# Patient Record
Sex: Male | Born: 1935
Health system: Southern US, Community
[De-identification: ages and names within clinical notes are randomized; demographics above are authoritative.]

## PROBLEM LIST (undated history)

## (undated) DIAGNOSIS — Z91199 Patient's noncompliance with other medical treatment and regimen due to unspecified reason: Secondary | ICD-10-CM

## (undated) DIAGNOSIS — Z9981 Dependence on supplemental oxygen: Secondary | ICD-10-CM

## (undated) DIAGNOSIS — D500 Iron deficiency anemia secondary to blood loss (chronic): Principal | ICD-10-CM

## (undated) DIAGNOSIS — I4891 Unspecified atrial fibrillation: Secondary | ICD-10-CM

## (undated) DIAGNOSIS — E785 Hyperlipidemia, unspecified: Secondary | ICD-10-CM

## (undated) DIAGNOSIS — J449 Chronic obstructive pulmonary disease, unspecified: Secondary | ICD-10-CM

## (undated) DIAGNOSIS — J9610 Chronic respiratory failure, unspecified whether with hypoxia or hypercapnia: Secondary | ICD-10-CM

## (undated) DIAGNOSIS — J40 Bronchitis, not specified as acute or chronic: Secondary | ICD-10-CM

## (undated) DIAGNOSIS — I10 Essential (primary) hypertension: Secondary | ICD-10-CM

## (undated) DIAGNOSIS — Z9119 Patient's noncompliance with other medical treatment and regimen: Secondary | ICD-10-CM

## (undated) DIAGNOSIS — R972 Elevated prostate specific antigen [PSA]: Secondary | ICD-10-CM

## (undated) DIAGNOSIS — E559 Vitamin D deficiency, unspecified: Secondary | ICD-10-CM

## (undated) DIAGNOSIS — E7800 Pure hypercholesterolemia, unspecified: Secondary | ICD-10-CM

## (undated) DIAGNOSIS — T7840XA Allergy, unspecified, initial encounter: Secondary | ICD-10-CM

## (undated) DIAGNOSIS — J8410 Pulmonary fibrosis, unspecified: Secondary | ICD-10-CM

## (undated) DIAGNOSIS — K635 Polyp of colon: Secondary | ICD-10-CM

## (undated) HISTORY — DX: Pure hypercholesterolemia, unspecified: E78.00

## (undated) HISTORY — DX: Iron deficiency anemia secondary to blood loss (chronic): D50.0

## (undated) HISTORY — DX: Allergy, unspecified, initial encounter: T78.40XA

## (undated) HISTORY — DX: Bronchitis, not specified as acute or chronic: J40

## (undated) HISTORY — DX: Vitamin D deficiency, unspecified: E55.9

## (undated) HISTORY — DX: Polyp of colon: K63.5

## (undated) HISTORY — DX: Unspecified atrial fibrillation: I48.91

## (undated) HISTORY — DX: Essential (primary) hypertension: I10

## (undated) HISTORY — DX: Pulmonary fibrosis, unspecified: J84.10

## (undated) HISTORY — DX: Chronic obstructive pulmonary disease, unspecified: J44.9

## (undated) HISTORY — DX: Elevated prostate specific antigen (PSA): R97.20

## (undated) HISTORY — DX: Hyperlipidemia, unspecified: E78.5

---

## 2002-09-21 ENCOUNTER — Emergency Department (HOSPITAL_COMMUNITY): Admission: EM | Admit: 2002-09-21 | Discharge: 2002-09-21 | Payer: Self-pay | Admitting: Emergency Medicine

## 2003-03-24 ENCOUNTER — Ambulatory Visit (HOSPITAL_COMMUNITY): Admission: RE | Admit: 2003-03-24 | Discharge: 2003-03-24 | Payer: Self-pay | Admitting: Family Medicine

## 2003-03-24 ENCOUNTER — Encounter: Payer: Self-pay | Admitting: Family Medicine

## 2003-05-08 ENCOUNTER — Encounter: Payer: Self-pay | Admitting: Family Medicine

## 2003-05-08 ENCOUNTER — Ambulatory Visit (HOSPITAL_COMMUNITY): Admission: RE | Admit: 2003-05-08 | Discharge: 2003-05-08 | Payer: Self-pay | Admitting: Family Medicine

## 2007-10-02 ENCOUNTER — Emergency Department (HOSPITAL_COMMUNITY): Admission: EM | Admit: 2007-10-02 | Discharge: 2007-10-02 | Payer: Self-pay | Admitting: Emergency Medicine

## 2010-09-14 ENCOUNTER — Ambulatory Visit: Payer: Self-pay | Admitting: Cardiovascular Disease

## 2011-04-10 ENCOUNTER — Encounter: Payer: Self-pay | Admitting: Family Medicine

## 2011-04-10 DIAGNOSIS — K635 Polyp of colon: Secondary | ICD-10-CM | POA: Insufficient documentation

## 2011-04-10 DIAGNOSIS — E785 Hyperlipidemia, unspecified: Secondary | ICD-10-CM | POA: Insufficient documentation

## 2011-04-10 DIAGNOSIS — T7840XA Allergy, unspecified, initial encounter: Secondary | ICD-10-CM | POA: Insufficient documentation

## 2011-04-10 DIAGNOSIS — I10 Essential (primary) hypertension: Secondary | ICD-10-CM | POA: Insufficient documentation

## 2011-04-10 DIAGNOSIS — J8410 Pulmonary fibrosis, unspecified: Secondary | ICD-10-CM | POA: Insufficient documentation

## 2011-04-10 DIAGNOSIS — J449 Chronic obstructive pulmonary disease, unspecified: Secondary | ICD-10-CM | POA: Insufficient documentation

## 2011-04-10 DIAGNOSIS — J40 Bronchitis, not specified as acute or chronic: Secondary | ICD-10-CM | POA: Insufficient documentation

## 2011-04-10 DIAGNOSIS — E1165 Type 2 diabetes mellitus with hyperglycemia: Secondary | ICD-10-CM | POA: Insufficient documentation

## 2012-05-14 LAB — HM DIABETES EYE EXAM

## 2012-06-20 NOTE — Patient Instructions (Addendum)
Your procedure is scheduled on: 06/25/2012  Report to [facility] at  900       AM.  Call this number if you have problems the morning of surgery: [phone]   Do not eat food or drink liquids :After Midnight.      Take these medicines the morning of surgery with A SIP OF WATER:qunipril. Take albuterol before you come.   Do not wear jewelry, make-up or nail polish.  Do not wear lotions, powders, or perfumes. You may wear deodorant.  Do not shave 48 hours prior to surgery.  Do not bring valuables to the hospital.  Contacts, dentures or bridgework may not be worn into surgery.  Leave suitcase in the car. After surgery it may be brought to your room.  For patients admitted to the hospital, checkout time is 11:00 AM the day of discharge.   Patients discharged the day of surgery will not be allowed to drive home.  :     Please read over the following fact sheets that you were given: Coughing and Deep Breathing, Surgical Site Infection Prevention, Anesthesia Post-op Instructions and Care and Recovery After Surgery    Cataract A cataract is a clouding of the lens of the eye. When a lens becomes cloudy, vision is reduced based on the degree and nature of the clouding. Many cataracts reduce vision to some degree. Some cataracts make people more near-sighted as they develop. Other cataracts increase glare. Cataracts that are ignored and become worse can sometimes look white. The white color can be seen through the pupil. CAUSES   Aging. However, cataracts may occur at any age, even in newborns.   Certain drugs.   Trauma to the eye.   Certain diseases such as diabetes.   Specific eye diseases such as chronic inflammation inside the eye or a sudden attack of a rare form of glaucoma.   Inherited or acquired medical problems.  SYMPTOMS   Gradual, progressive drop in vision in the affected eye.   Severe, rapid visual loss. This most often happens when trauma is the cause.  DIAGNOSIS  To  detect a cataract, an eye doctor examines the lens. Cataracts are best diagnosed with an exam of the eyes with the pupils enlarged (dilated) by drops.  TREATMENT  For an early cataract, vision may improve by using different eyeglasses or stronger lighting. If that does not help your vision, surgery is the only effective treatment. A cataract needs to be surgically removed when vision loss interferes with your everyday activities, such as driving, reading, or watching TV. A cataract may also have to be removed if it prevents examination or treatment of another eye problem. Surgery removes the cloudy lens and usually replaces it with a substitute lens (intraocular lens, IOL).  At a time when both you and your doctor agree, the cataract will be surgically removed. If you have cataracts in both eyes, only one is usually removed at a time. This allows the operated eye to heal and be out of danger from any possible problems after surgery (such as infection or poor wound healing). In rare cases, a cataract may be doing damage to your eye. In these cases, your caregiver may advise surgical removal right away. The vast majority of people who have cataract surgery have better vision afterward. HOME CARE INSTRUCTIONS  If you are not planning surgery, you may be asked to do the following:  Use different eyeglasses.   Use stronger or brighter lighting.   Ask  your eye doctor about reducing your medicine dose or changing medicines if it is thought that a medicine caused your cataract. Changing medicines does not make the cataract go away on its own.   Become familiar with your surroundings. Poor vision can lead to injury. Avoid bumping into things on the affected side. You are at a higher risk for tripping or falling.   Exercise extreme care when driving or operating machinery.   Wear sunglasses if you are sensitive to bright light or experiencing problems with glare.  SEEK IMMEDIATE MEDICAL CARE IF:   You have  a worsening or sudden vision loss.   You notice redness, swelling, or increasing pain in the eye.   You have a fever.  Document Released: 09/18/2005 Document Revised: 09/07/2011 Document Reviewed: 05/12/2011 [facility] Patient Information 2012 [place], [place].PATIENT INSTRUCTIONS POST-ANESTHESIA  IMMEDIATELY FOLLOWING SURGERY:  Do not drive or operate machinery for the first twenty four hours after surgery.  Do not make any important decisions for twenty four hours after surgery or while taking narcotic pain medications or sedatives.  If you develop intractable nausea and vomiting or a severe headache please notify your doctor immediately.  FOLLOW-UP:  Please make an appointment with your surgeon as instructed. You do not need to follow up with anesthesia unless specifically instructed to do so.  WOUND CARE INSTRUCTIONS (if applicable):  Keep a dry clean dressing on the anesthesia/puncture wound site if there is drainage.  Once the wound has quit draining you may leave it open to air.  Generally you should leave the bandage intact for twenty four hours unless there is drainage.  If the epidural site drains for more than 36-48 hours please call the anesthesia department.  QUESTIONS?:  Please feel free to call your physician or the hospital operator if you have any questions, and they will be happy to assist you.

## 2012-06-21 ENCOUNTER — Encounter (HOSPITAL_COMMUNITY): Payer: Self-pay

## 2012-06-21 ENCOUNTER — Encounter (HOSPITAL_COMMUNITY)
Admission: RE | Admit: 2012-06-21 | Discharge: 2012-06-21 | Disposition: A | Discharge status: Home / Self Care | Payer: Medicare Other | Source: Ambulatory Visit | Attending: Ophthalmology | Admitting: Ophthalmology

## 2012-06-21 ENCOUNTER — Encounter (HOSPITAL_COMMUNITY): Payer: Self-pay | Admitting: Pharmacy Technician

## 2012-06-21 ENCOUNTER — Ambulatory Visit (HOSPITAL_COMMUNITY)
Admission: RE | Admit: 2012-06-21 | Discharge: 2012-06-21 | Disposition: A | Discharge status: Home / Self Care | Payer: Medicare Other | Source: Ambulatory Visit | Attending: Ophthalmology | Admitting: Ophthalmology

## 2012-06-21 LAB — BASIC METABOLIC PANEL
BUN: 13 mg/dL (ref 6–23)
CO2: 30 mEq/L (ref 19–32)
Calcium: 10 mg/dL (ref 8.4–10.5)
GFR calc non Af Amer: 88 mL/min — ABNORMAL LOW (ref >90)
Glucose, Bld: 91 mg/dL (ref 70–99)
Potassium: 5.2 mEq/L — ABNORMAL HIGH (ref 3.5–5.1)

## 2012-06-21 LAB — HEMOGLOBIN AND HEMATOCRIT, BLOOD: HCT: 46.3 % (ref 39.0–52.0)

## 2012-06-21 NOTE — Progress Notes (Signed)
06/21/12 1334  OBSTRUCTIVE SLEEP APNEA  Have you ever been diagnosed with sleep apnea through a sleep study? No  Do you snore loudly (loud enough to be heard through closed doors)?  0  Do you often feel tired, fatigued, or sleepy during the daytime? 1  Has anyone observed you stop breathing during your sleep? 0  Do you have, or are you being treated for high blood pressure? 1  BMI more than 35 kg/m2? 0  Age over 75 years old? 1  Neck circumference greater than 40 cm/18 inches? 0  Gender: 1  Obstructive Sleep Apnea Score 4   Score 4 or greater  Results sent to PCP

## 2012-06-24 MED ORDER — PHENYLEPHRINE HCL 2.5 % OP SOLN
OPHTHALMIC | Status: AC
  Filled 2012-06-24: qty 2

## 2012-06-24 MED ORDER — TETRACAINE HCL 0.5 % OP SOLN
OPHTHALMIC | Status: AC
  Filled 2012-06-24: qty 2

## 2012-06-24 MED ORDER — CYCLOPENTOLATE HCL 1 % OP SOLN
OPHTHALMIC | Status: AC
  Filled 2012-06-24: qty 2

## 2012-06-24 MED ORDER — FLURBIPROFEN SODIUM 0.03 % OP SOLN
OPHTHALMIC | Status: AC
  Filled 2012-06-24: qty 2.5

## 2012-06-24 NOTE — Pre-Procedure Instructions (Signed)
Potassium 5.2-shown to Dr Jayme Cloud. I-stat ordered on arrival to [facility] 06/25/2012.

## 2012-06-25 ENCOUNTER — Encounter (HOSPITAL_COMMUNITY): Payer: Self-pay | Admitting: Anesthesiology

## 2012-06-25 ENCOUNTER — Encounter (HOSPITAL_COMMUNITY): Payer: Self-pay | Admitting: *Deleted

## 2012-06-25 ENCOUNTER — Ambulatory Visit (HOSPITAL_COMMUNITY)
Admission: RE | Admit: 2012-06-25 | Discharge: 2012-06-25 | Disposition: A | Discharge status: Home / Self Care | Payer: Medicare Other | Source: Ambulatory Visit | Attending: Ophthalmology | Admitting: Ophthalmology

## 2012-06-25 ENCOUNTER — Encounter (HOSPITAL_COMMUNITY)
Admission: RE | Disposition: A | Discharge status: Home / Self Care | Payer: Self-pay | Source: Ambulatory Visit | Attending: Ophthalmology

## 2012-06-25 ENCOUNTER — Ambulatory Visit (HOSPITAL_COMMUNITY): Payer: Medicare Other | Admitting: Anesthesiology

## 2012-06-25 DIAGNOSIS — Z01812 Encounter for preprocedural laboratory examination: Secondary | ICD-10-CM | POA: Insufficient documentation

## 2012-06-25 DIAGNOSIS — E119 Type 2 diabetes mellitus without complications: Secondary | ICD-10-CM | POA: Insufficient documentation

## 2012-06-25 DIAGNOSIS — Z01810 Encounter for preprocedural cardiovascular examination: Secondary | ICD-10-CM | POA: Insufficient documentation

## 2012-06-25 DIAGNOSIS — Z79899 Other long term (current) drug therapy: Secondary | ICD-10-CM | POA: Insufficient documentation

## 2012-06-25 DIAGNOSIS — J4489 Other specified chronic obstructive pulmonary disease: Secondary | ICD-10-CM | POA: Insufficient documentation

## 2012-06-25 DIAGNOSIS — I10 Essential (primary) hypertension: Secondary | ICD-10-CM | POA: Insufficient documentation

## 2012-06-25 DIAGNOSIS — H2510 Age-related nuclear cataract, unspecified eye: Secondary | ICD-10-CM | POA: Insufficient documentation

## 2012-06-25 DIAGNOSIS — J449 Chronic obstructive pulmonary disease, unspecified: Secondary | ICD-10-CM | POA: Insufficient documentation

## 2012-06-25 HISTORY — PX: CATARACT EXTRACTION W/PHACO: SHX586

## 2012-06-25 LAB — GLUCOSE, CAPILLARY: Glucose-Capillary: 163 mg/dL — ABNORMAL HIGH (ref 70–99)

## 2012-06-25 SURGERY — PHACOEMULSIFICATION, CATARACT, WITH IOL INSERTION
Anesthesia: Monitor Anesthesia Care | Site: Eye | Laterality: Left | Wound class: Clean

## 2012-06-25 MED ORDER — MIDAZOLAM HCL 2 MG/2ML IJ SOLN
1.0000 mg | INTRAMUSCULAR | Status: DC | PRN
  Administered 2012-06-25: 2 mg via INTRAVENOUS

## 2012-06-25 MED ORDER — PHENYLEPHRINE HCL 2.5 % OP SOLN
1.0000 [drp] | OPHTHALMIC | Status: AC
  Administered 2012-06-25 (×3): 1 [drp] via OPHTHALMIC

## 2012-06-25 MED ORDER — EPINEPHRINE HCL 1 MG/ML IJ SOLN
INTRAOCULAR | Status: DC | PRN
  Administered 2012-06-25: 11:00:00

## 2012-06-25 MED ORDER — PROVISC 10 MG/ML IO SOLN
INTRAOCULAR | Status: DC | PRN
  Administered 2012-06-25: 8.5 mg via INTRAOCULAR

## 2012-06-25 MED ORDER — EPINEPHRINE HCL 1 MG/ML IJ SOLN
INTRAMUSCULAR | Status: AC
  Filled 2012-06-25: qty 1

## 2012-06-25 MED ORDER — FENTANYL CITRATE 0.05 MG/ML IJ SOLN: 25.0000 ug | INTRAMUSCULAR | Status: DC | PRN

## 2012-06-25 MED ORDER — FLURBIPROFEN SODIUM 0.03 % OP SOLN
1.0000 [drp] | OPHTHALMIC | Status: DC | PRN
  Administered 2012-06-25 (×3): 1 [drp] via OPHTHALMIC

## 2012-06-25 MED ORDER — ONDANSETRON HCL 4 MG/2ML IJ SOLN: 4.0000 mg | Freq: Once | INTRAMUSCULAR | Status: DC | PRN

## 2012-06-25 MED ORDER — BSS IO SOLN
INTRAOCULAR | Status: DC | PRN
  Administered 2012-06-25: 15 mL via INTRAOCULAR

## 2012-06-25 MED ORDER — MIDAZOLAM HCL 2 MG/2ML IJ SOLN
INTRAMUSCULAR | Status: AC
  Filled 2012-06-25: qty 2

## 2012-06-25 MED ORDER — CYCLOPENTOLATE-PHENYLEPHRINE 0.2-1 % OP SOLN: 1.0000 [drp] | OPHTHALMIC | Status: DC

## 2012-06-25 MED ORDER — LACTATED RINGERS IV SOLN
INTRAVENOUS | Status: DC
  Administered 2012-06-25: 1000 mL via INTRAVENOUS

## 2012-06-25 MED ORDER — TETRACAINE HCL 0.5 % OP SOLN
1.0000 [drp] | OPHTHALMIC | Status: AC
  Administered 2012-06-25 (×3): 1 [drp] via OPHTHALMIC

## 2012-06-25 MED ORDER — CYCLOPENTOLATE HCL 1 % OP SOLN
1.0000 [drp] | OPHTHALMIC | Status: AC
  Administered 2012-06-25 (×3): 1 [drp] via OPHTHALMIC

## 2012-06-25 MED ORDER — KETOROLAC TROMETHAMINE 0.5 % OP SOLN: 1.0000 [drp] | OPHTHALMIC | Status: DC

## 2012-06-25 SURGICAL SUPPLY — 9 items
CLOTH BEACON ORANGE TIMEOUT ST (SAFETY) ×2 IMPLANT
EYE SHIELD UNIVERSAL CLEAR (GAUZE/BANDAGES/DRESSINGS) ×2 IMPLANT
GLOVE BIO SURGEON STRL SZ 6.5 (GLOVE) ×2 IMPLANT
GLOVE EXAM NITRILE MD LF STRL (GLOVE) ×2 IMPLANT
PAD ARMBOARD 7.5X6 YLW CONV (MISCELLANEOUS) ×2 IMPLANT
SIGHTPATH CAT PROC W REG LENS (Ophthalmic Related) ×2 IMPLANT
TAPE SURG TRANSPORE 1 IN (GAUZE/BANDAGES/DRESSINGS) ×1 IMPLANT
TAPE SURGICAL TRANSPORE 1 IN (GAUZE/BANDAGES/DRESSINGS) ×1
WATER STERILE IRR 250ML POUR (IV SOLUTION) ×2 IMPLANT

## 2012-06-25 NOTE — H&P (Signed)
The patient was re examined and there is no change in the patients condition since the original H and P. 

## 2012-06-25 NOTE — Op Note (Signed)
Patient brought to the operating room and prepped and draped in the usual manner.  Lid speculum inserted in left eye.  Stab incision made at the twelve o'clock position.  Provisc instilled in the anterior chamber.   A 2.4 mm. Stab incision was made temporally.  An anterior capsulotomy was done with a bent 25 gauge needle.  The nucleus was hydrodissected.  The Phaco tip was inserted in the anterior chamber and the nucleus was emulsified.  CDE was 19.01.  The cortical material was then removed with the I and A tip.  Posterior capsule was the polished.  The anterior chamber was deepened with Provisc.  A 19.5 Diopter Rayner 570C IOL was then inserted in the capsular bag.  Provisc was then removed with the I and A tip.  The wound was then hydrated.  Patient sent to the Recovery Room in good condition with follow up in my office.

## 2012-06-25 NOTE — Anesthesia Postprocedure Evaluation (Signed)
  Anesthesia Post-op Note  Patient: Edward [name]  Procedure(s) Performed: Procedure(s) (LRB) with comments: CATARACT EXTRACTION PHACO AND INTRAOCULAR LENS PLACEMENT (IOC) (Left) - CDE=19.01  Patient Location: PACU and Short Stay  Anesthesia Type: MAC  Level of Consciousness: awake, alert , oriented and patient cooperative  Airway and Oxygen Therapy: Patient Spontanous Breathing  Post-op Pain: none  Post-op Assessment: Post-op Vital signs reviewed, Patient's Cardiovascular Status Stable, PATIENT'S CARDIOVASCULAR STATUS UNSTABLE, Patent Airway, No signs of Nausea or vomiting and Pain level controlled  Post-op Vital Signs: Reviewed and stable  Complications: No apparent anesthesia complications

## 2012-06-25 NOTE — Anesthesia Preprocedure Evaluation (Signed)
Anesthesia Evaluation  Patient identified by MRN, date of birth, ID band Patient awake    Reviewed: Allergy & Precautions, H&P , NPO status , Patient's Chart, lab work & pertinent test results  Airway Mallampati: I      Dental  (+) Edentulous Upper and Edentulous Lower   Pulmonary COPDCurrent Smoker,    + decreased breath sounds      Cardiovascular hypertension, Pt. on medications Rhythm:Regular     Neuro/Psych    GI/Hepatic   Endo/Other  diabetes, Well Controlled, Type 2, Oral Hypoglycemic Agents  Renal/GU      Musculoskeletal   Abdominal   Peds  Hematology   Anesthesia Other Findings   Reproductive/Obstetrics                           Anesthesia Physical Anesthesia Plan  ASA: III  Anesthesia Plan: MAC   Post-op Pain Management:    Induction: Intravenous  Airway Management Planned: Nasal Cannula  Additional Equipment:   Intra-op Plan:   Post-operative Plan:   Informed Consent: I have reviewed the patients History and Physical, chart, labs and discussed the procedure including the risks, benefits and alternatives for the proposed anesthesia with the patient or authorized representative who has indicated his/her understanding and acceptance.     Plan Discussed with:   Anesthesia Plan Comments:         Anesthesia Quick Evaluation

## 2012-06-25 NOTE — Transfer of Care (Signed)
Immediate Anesthesia Transfer of Care Note  Patient: Edward [name]  Procedure(s) Performed: Procedure(s) (LRB) with comments: CATARACT EXTRACTION PHACO AND INTRAOCULAR LENS PLACEMENT (IOC) (Left) - CDE=19.01  Patient Location: PACU and Short Stay  Anesthesia Type: MAC  Level of Consciousness: awake, alert , oriented and patient cooperative  Airway & Oxygen Therapy: Patient Spontanous Breathing  Post-op Assessment: Report given to PACU RN, Post -op Vital signs reviewed and stable and Patient moving all extremities  Post vital signs: Reviewed and stable  Complications: No apparent anesthesia complications

## 2012-06-28 ENCOUNTER — Encounter (HOSPITAL_COMMUNITY): Payer: Self-pay | Admitting: Ophthalmology

## 2012-07-04 ENCOUNTER — Inpatient Hospital Stay (HOSPITAL_COMMUNITY): Admission: RE | Admit: 2012-07-04 | Discharge: 2012-07-04 | Payer: Medicare Other | Source: Ambulatory Visit

## 2012-07-08 MED ORDER — FLURBIPROFEN SODIUM 0.03 % OP SOLN
OPHTHALMIC | Status: AC
  Filled 2012-07-08: qty 2.5

## 2012-07-08 MED ORDER — TETRACAINE HCL 0.5 % OP SOLN
OPHTHALMIC | Status: AC
  Filled 2012-07-08: qty 2

## 2012-07-08 MED ORDER — CYCLOPENTOLATE HCL 1 % OP SOLN
OPHTHALMIC | Status: AC
  Filled 2012-07-08: qty 2

## 2012-07-08 MED ORDER — PHENYLEPHRINE HCL 2.5 % OP SOLN
OPHTHALMIC | Status: AC
  Filled 2012-07-08: qty 2

## 2012-07-09 ENCOUNTER — Ambulatory Visit (HOSPITAL_COMMUNITY)
Admission: RE | Admit: 2012-07-09 | Discharge: 2012-07-09 | Disposition: A | Discharge status: Home / Self Care | Payer: Medicare Other | Source: Ambulatory Visit | Attending: Ophthalmology | Admitting: Ophthalmology

## 2012-07-09 ENCOUNTER — Ambulatory Visit (HOSPITAL_COMMUNITY): Payer: Medicare Other | Admitting: Anesthesiology

## 2012-07-09 ENCOUNTER — Encounter (HOSPITAL_COMMUNITY): Payer: Self-pay | Admitting: Anesthesiology

## 2012-07-09 ENCOUNTER — Encounter (HOSPITAL_COMMUNITY): Payer: Self-pay | Admitting: *Deleted

## 2012-07-09 ENCOUNTER — Encounter (HOSPITAL_COMMUNITY)
Admission: RE | Disposition: A | Discharge status: Home / Self Care | Payer: Self-pay | Source: Ambulatory Visit | Attending: Ophthalmology

## 2012-07-09 DIAGNOSIS — Z01812 Encounter for preprocedural laboratory examination: Secondary | ICD-10-CM | POA: Insufficient documentation

## 2012-07-09 DIAGNOSIS — E119 Type 2 diabetes mellitus without complications: Secondary | ICD-10-CM | POA: Insufficient documentation

## 2012-07-09 DIAGNOSIS — H2510 Age-related nuclear cataract, unspecified eye: Secondary | ICD-10-CM | POA: Insufficient documentation

## 2012-07-09 DIAGNOSIS — J449 Chronic obstructive pulmonary disease, unspecified: Secondary | ICD-10-CM | POA: Insufficient documentation

## 2012-07-09 DIAGNOSIS — J4489 Other specified chronic obstructive pulmonary disease: Secondary | ICD-10-CM | POA: Insufficient documentation

## 2012-07-09 DIAGNOSIS — I10 Essential (primary) hypertension: Secondary | ICD-10-CM | POA: Insufficient documentation

## 2012-07-09 HISTORY — PX: CATARACT EXTRACTION W/PHACO: SHX586

## 2012-07-09 SURGERY — PHACOEMULSIFICATION, CATARACT, WITH IOL INSERTION
Anesthesia: Monitor Anesthesia Care | Site: Eye | Laterality: Right | Wound class: Clean

## 2012-07-09 MED ORDER — TETRACAINE HCL 0.5 % OP SOLN
1.0000 [drp] | OPHTHALMIC | Status: AC
  Administered 2012-07-09 (×3): 1 [drp] via OPHTHALMIC

## 2012-07-09 MED ORDER — FLURBIPROFEN SODIUM 0.03 % OP SOLN
1.0000 [drp] | OPHTHALMIC | Status: AC
  Administered 2012-07-09 (×3): 1 [drp] via OPHTHALMIC

## 2012-07-09 MED ORDER — EPINEPHRINE HCL 1 MG/ML IJ SOLN
INTRAOCULAR | Status: DC | PRN
  Administered 2012-07-09: 10:00:00

## 2012-07-09 MED ORDER — CYCLOPENTOLATE HCL 1 % OP SOLN
1.0000 [drp] | OPHTHALMIC | Status: AC
  Administered 2012-07-09 (×3): 1 [drp] via OPHTHALMIC

## 2012-07-09 MED ORDER — LACTATED RINGERS IV SOLN
INTRAVENOUS | Status: DC
  Administered 2012-07-09: 1000 mL via INTRAVENOUS

## 2012-07-09 MED ORDER — EPINEPHRINE HCL 1 MG/ML IJ SOLN
INTRAMUSCULAR | Status: AC
  Filled 2012-07-09: qty 1

## 2012-07-09 MED ORDER — CYCLOPENTOLATE-PHENYLEPHRINE 0.2-1 % OP SOLN: 1.0000 [drp] | OPHTHALMIC | Status: AC

## 2012-07-09 MED ORDER — PROVISC 10 MG/ML IO SOLN
INTRAOCULAR | Status: DC | PRN
  Administered 2012-07-09: 8.5 mg via INTRAOCULAR

## 2012-07-09 MED ORDER — BSS IO SOLN
INTRAOCULAR | Status: DC | PRN
  Administered 2012-07-09: 15 mL via INTRAOCULAR

## 2012-07-09 MED ORDER — MIDAZOLAM HCL 2 MG/2ML IJ SOLN
1.0000 mg | INTRAMUSCULAR | Status: DC | PRN
  Administered 2012-07-09: 2 mg via INTRAVENOUS

## 2012-07-09 MED ORDER — PHENYLEPHRINE HCL 2.5 % OP SOLN
1.0000 [drp] | OPHTHALMIC | Status: AC
  Administered 2012-07-09 (×3): 1 [drp] via OPHTHALMIC

## 2012-07-09 MED ORDER — MIDAZOLAM HCL 2 MG/2ML IJ SOLN
INTRAMUSCULAR | Status: AC
  Filled 2012-07-09: qty 2

## 2012-07-09 SURGICAL SUPPLY — 23 items
CAPSULAR TENSION RING-AMO (OPHTHALMIC RELATED) IMPLANT
CLOTH BEACON ORANGE TIMEOUT ST (SAFETY) ×2 IMPLANT
EYE SHIELD UNIVERSAL CLEAR (GAUZE/BANDAGES/DRESSINGS) ×2 IMPLANT
GLOVE BIO SURGEON STRL SZ 6.5 (GLOVE) ×2 IMPLANT
GLOVE ECLIPSE 6.5 STRL STRAW (GLOVE) IMPLANT
GLOVE ECLIPSE 7.0 STRL STRAW (GLOVE) IMPLANT
GLOVE EXAM NITRILE LRG STRL (GLOVE) IMPLANT
GLOVE EXAM NITRILE MD LF STRL (GLOVE) ×2 IMPLANT
GLOVE SKINSENSE NS SZ6.5 (GLOVE)
GLOVE SKINSENSE STRL SZ6.5 (GLOVE) IMPLANT
HEALON 5 0.6 ML (INTRAOCULAR LENS) IMPLANT
KIT VITRECTOMY (OPHTHALMIC RELATED) IMPLANT
PAD ARMBOARD 7.5X6 YLW CONV (MISCELLANEOUS) ×2 IMPLANT
PROC W NO LENS (INTRAOCULAR LENS)
PROC W SPEC LENS (INTRAOCULAR LENS)
PROCESS W NO LENS (INTRAOCULAR LENS) IMPLANT
PROCESS W SPEC LENS (INTRAOCULAR LENS) IMPLANT
RING MALYGIN (MISCELLANEOUS) IMPLANT
SIGHTPATH CAT PROC W REG LENS (Ophthalmic Related) ×2 IMPLANT
TAPE SURG TRANSPORE 1 IN (GAUZE/BANDAGES/DRESSINGS) ×1 IMPLANT
TAPE SURGICAL TRANSPORE 1 IN (GAUZE/BANDAGES/DRESSINGS) ×1
VISCOELASTIC ADDITIONAL (OPHTHALMIC RELATED) IMPLANT
WATER STERILE IRR 250ML POUR (IV SOLUTION) ×2 IMPLANT

## 2012-07-09 NOTE — Anesthesia Postprocedure Evaluation (Signed)
  Anesthesia Post-op Note  Patient: Edward [name]  Procedure(s) Performed: Procedure(s) (LRB) with comments: CATARACT EXTRACTION PHACO AND INTRAOCULAR LENS PLACEMENT (IOC) (Right) - CDE: 20.09  Patient Location: PACU and Short Stay  Anesthesia Type: MAC  Level of Consciousness: awake, alert , oriented and patient cooperative  Airway and Oxygen Therapy: Patient Spontanous Breathing  Post-op Pain: none  Post-op Assessment: Post-op Vital signs reviewed, Patient's Cardiovascular Status Stable, Respiratory Function Stable, Patent Airway and Pain level controlled  Post-op Vital Signs: Reviewed and stable  Complications: No apparent anesthesia complications

## 2012-07-09 NOTE — Transfer of Care (Signed)
Immediate Anesthesia Transfer of Care Note  Patient: Edward [name]  Procedure(s) Performed: Procedure(s) (LRB) with comments: CATARACT EXTRACTION PHACO AND INTRAOCULAR LENS PLACEMENT (IOC) (Right) - CDE: 20.09  Patient Location: PACU and Short Stay  Anesthesia Type: MAC  Level of Consciousness: awake, alert , oriented and patient cooperative  Airway & Oxygen Therapy: Patient Spontanous Breathing  Post-op Assessment: Report given to PACU RN, Post -op Vital signs reviewed and stable and Patient moving all extremities  Post vital signs: Reviewed and stable  Complications: No apparent anesthesia complications

## 2012-07-09 NOTE — H&P (Signed)
The patient was re examined and there is no change in the patients condition since the original H and P. 

## 2012-07-09 NOTE — Op Note (Signed)
Patient brought to the operating room and prepped and draped in the usual manner.  Lid speculum inserted in right eye.  Stab incision made at the twelve o'clock position.  Provisc instilled in the anterior chamber.   A 2.4 mm. Stab incision was made temporally.  An anterior capsulotomy was done with a bent 25 gauge needle.  The nucleus was hydrodissected.  The Phaco tip was inserted in the anterior chamber and the nucleus was emulsified.  CDE was 20.09.  The cortical material was then removed with the I and A tip.  Posterior capsule was the polished.  The anterior chamber was deepened with Provisc.  A 20.0 Diopter Rayner 570C IOL was then inserted in the capsular bag.  Provisc was then removed with the I and A tip.  The wound was then hydrated.  Patient sent to the Recovery Room in good condition with follow up in my office.

## 2012-07-09 NOTE — Anesthesia Preprocedure Evaluation (Signed)
Anesthesia Evaluation  Patient identified by MRN, date of birth, ID band Patient awake    Reviewed: Allergy & Precautions, H&P , NPO status , Patient's Chart, lab work & pertinent test results  Airway Mallampati: I      Dental  (+) Edentulous Upper and Edentulous Lower   Pulmonary COPDCurrent Smoker,    + decreased breath sounds      Cardiovascular hypertension, Pt. on medications Rhythm:Regular     Neuro/Psych    GI/Hepatic   Endo/Other  diabetes, Well Controlled, Type 2, Oral Hypoglycemic Agents  Renal/GU      Musculoskeletal   Abdominal   Peds  Hematology   Anesthesia Other Findings   Reproductive/Obstetrics                           Anesthesia Physical Anesthesia Plan  ASA: III  Anesthesia Plan: MAC   Post-op Pain Management:    Induction: Intravenous  Airway Management Planned: Nasal Cannula  Additional Equipment:   Intra-op Plan:   Post-operative Plan:   Informed Consent: I have reviewed the patients History and Physical, chart, labs and discussed the procedure including the risks, benefits and alternatives for the proposed anesthesia with the patient or authorized representative who has indicated his/her understanding and acceptance.     Plan Discussed with:   Anesthesia Plan Comments:         Anesthesia Quick Evaluation  

## 2012-07-09 NOTE — Addendum Note (Signed)
Addendum  created 07/09/12 1519 by Franco Nones, CRNA   Modules edited:Charges VN

## 2012-07-11 ENCOUNTER — Encounter (HOSPITAL_COMMUNITY): Payer: Self-pay | Admitting: Ophthalmology

## 2012-11-14 ENCOUNTER — Encounter: Payer: Self-pay | Admitting: *Deleted

## 2013-01-15 ENCOUNTER — Encounter: Payer: Self-pay | Admitting: Family Medicine

## 2013-01-20 ENCOUNTER — Encounter: Payer: Self-pay | Admitting: Physician Assistant

## 2013-01-20 ENCOUNTER — Ambulatory Visit (INDEPENDENT_AMBULATORY_CARE_PROVIDER_SITE_OTHER): Payer: Medicare Other | Admitting: Physician Assistant

## 2013-01-20 VITALS — BP 114/60 | HR 64 | Temp 97.8°F | Resp 16 | Ht 65.0 in | Wt 149.0 lb

## 2013-01-20 DIAGNOSIS — J449 Chronic obstructive pulmonary disease, unspecified: Secondary | ICD-10-CM

## 2013-01-20 DIAGNOSIS — K635 Polyp of colon: Secondary | ICD-10-CM

## 2013-01-20 DIAGNOSIS — E119 Type 2 diabetes mellitus without complications: Secondary | ICD-10-CM

## 2013-01-20 DIAGNOSIS — D126 Benign neoplasm of colon, unspecified: Secondary | ICD-10-CM

## 2013-01-20 DIAGNOSIS — I10 Essential (primary) hypertension: Secondary | ICD-10-CM

## 2013-01-20 DIAGNOSIS — R972 Elevated prostate specific antigen [PSA]: Secondary | ICD-10-CM | POA: Insufficient documentation

## 2013-01-20 DIAGNOSIS — E785 Hyperlipidemia, unspecified: Secondary | ICD-10-CM

## 2013-01-20 LAB — LIPID PANEL
Cholesterol: 144 mg/dL (ref 0–200)
HDL: 44 mg/dL (ref >39)
Total CHOL/HDL Ratio: 3.3 Ratio
VLDL: 33 mg/dL (ref 0–40)

## 2013-01-20 LAB — COMPLETE METABOLIC PANEL WITH GFR
AST: 11 U/L (ref 0–37)
Albumin: 4 g/dL (ref 3.5–5.2)
Alkaline Phosphatase: 64 U/L (ref 39–117)
BUN: 14 mg/dL (ref 6–23)
Potassium: 5.4 mEq/L — ABNORMAL HIGH (ref 3.5–5.3)
Sodium: 138 mEq/L (ref 135–145)
Total Bilirubin: 0.3 mg/dL (ref 0.3–1.2)

## 2013-01-20 LAB — HEMOGLOBIN A1C: Hgb A1c MFr Bld: 8 % — ABNORMAL HIGH (ref <5.7)

## 2013-01-21 NOTE — Progress Notes (Signed)
Patient ID: NIKKI RUSNAK MRN: [id number], DOB: 01/24/1935, 78 y.o. Date of Encounter: 01/21/2013, 9:07 AM   Chief Complaint: Diabetes, Hypertension, Hyperlipidemia Follow Up  HPI: 78 y.o. year old male with history below presents for follow up.  1-Financial Distress/Noncompliance: In Past has repeatedly been unable to take all diabetic meds as directed secondary to finances.  2-Diabetes: At LOv 10/21/12 A1C was 7.7. At that time he was to check with pharmacy to compare cost of Actos vs Lantus. He states the Actos costs $45 but he has taken it 2 of the past 3 months. One month he did not have the money for it.  He never checks BS. Never has symptoms of hypo- or hyper- glycemia.  Does no routine exercise and not real careful with diet. I have discussed the need for routine eye exams and dental exams but he has not had these secondary to cost.  Hypertension: Taking medications as directed with no adverse effects. No lower extremity swelling.  Hyperlipidemia: Taking medications as directed with no adverse effects. No myalgias.  Smoker: Says he has not smoke in past 3 weeks. "Had a cold and didn't want them. Just hasn't started back." Cold symptoms resolved.    Past Medical History  Diagnosis Date  . Diabetes mellitus   . COPD (chronic obstructive pulmonary disease)   . Hypertension   . Allergy     Rhinitis  . Elevated lipids   . Pulmonary fibrosis   . Bronchitis   . Colon polyps   . Vitamin D deficiency   . PSA elevation      Home Meds:SEE ATTACHED MEDIATION SECTION FOR UPDATES ENTERED TODAY. HE HAS TAKEN ACTOS 45MG  QD 2 OF THE PAST 3 MONTHS Current Outpatient Prescriptions on File Prior to Visit  Medication Sig Dispense Refill  . aspirin 81 MG tablet Take 81 mg by mouth daily.        Marland Kitchen glimepiride (AMARYL) 4 MG tablet Take 4 mg by mouth 2 (two) times daily.       . metFORMIN (GLUMETZA) 1000 MG (MOD) 24 hr tablet Take 1,000 mg by mouth 2 (two) times daily with a meal.        . pravastatin (PRAVACHOL) 80 MG tablet Take 80 mg by mouth at bedtime.      . quinapril (ACCUPRIL) 20 MG tablet Take 20 mg by mouth daily.       Marland Kitchen albuterol (PROVENTIL) (2.5 MG/3ML) 0.083% nebulizer solution Take 2.5 mg by nebulization every 6 (six) hours as needed.         No current facility-administered medications on file prior to visit.    Allergies: No Known Allergies  History   Social History  . Marital Status: Married    Spouse Name: N/A    Number of Children: N/A  . Years of Education: N/A   Occupational History  . Not on file.   Social History Main Topics  . Smoking status: Current Every Day Smoker -- 1.50 packs/day for 60 years    Types: Cigarettes  . Smokeless tobacco: Not on file  . Alcohol Use: No  . Drug Use: No  . Sexually Active: Yes    Birth Control/ Protection: None   Other Topics Concern  . Not on file   Social History Narrative  . No narrative on file     Review of Systems: Constitutional: negative for chills, fever, night sweats, weight changes, or fatigue  HEENT: negative for vision changes, hearing loss, congestion, rhinorrhea, or epistaxis  Cardiovascular: negative for chest pain, palpitations, diaphoresis, DOE, orthopnea, or edema Respiratory: negative for hemoptysis, wheezing, shortness of breath, dyspnea, or cough Abdominal: negative for abdominal pain, nausea, vomiting, diarrhea, or constipation Dermatological: negative for rash, erythema, or wounds Neurologic: negative for headache, dizziness, or syncope All other systems reviewed and are otherwise negative with the exception to those above and in the HPI.   Physical Exam: Blood pressure 114/60, pulse 64, temperature 97.8 F (36.6 C), temperature source Oral, resp. rate 16, height 5\' 5"  (1.651 m), weight 149 lb (67.586 kg)., Body mass index is 24.79 kg/(m^2). General: WM. "Weathered Appearance". in no acute distress.  Neck: Supple. No thyromegaly. Full ROM. No lymphadenopathy.No  carotid bruits. Lungs:Slightly distant BS but Clear bilaterally to auscultation without wheezes, rales, or rhonchi. Breathing is unlabored. Heart: RRR with normal S1 S2. No murmurs, rubs, or gallops. Abdomen: Soft, non-tender, non-distended with normoactive bowel sounds. No hepatosplenomegaly. No rebound/guarding. No obvious abdominal masses. Msk:  Strength and tone normal for age. Extremities/Skin: Warm and dry. No clubbing or cyanosis. No edema. No rashes or suspicious lesions.  Neuro: Alert and oriented X 3. Moves all extremities spontaneously. Gait is normal. CNII-XII grossly in tact. Psych:  Responds to questions appropriately with a normal affect. Diabetic Foot Exam: Normal inspection. No skin breakdown. No callous. Normal sensation to light touch and monofilament. Nails are normal. 2+ right dorsalis pedis pulses. 1+ left dorsalis pedis pulse. 2+ bilateral posterior tibialis pulses.    Assessment and Plan:  1. Diabetes:  Last AIC: 10/21/12=7.7. Actos 45mg  added. Has taken 2 of 3 months. Told him to check [email or web address] for savings/coupons Last Microalbumin: 04/10/12. Repeat next OV Last Eye Exam:see HPI Last Dental Exam: see HPI Pt aware of proper foot care. Has been educated regarding proper low carbohydrate diet and routine cardiovascular exercise. Check A1C then adjust medications accordingly.  Otherwise, continue current medications and follow up with a routine office visit in 3 months. Check CMET.  2. Hypertension: At goal. Continue current medications. Check a BMET. On ACE Inh  3. Hyperlipidemia: On Statin Check FLP/LFT then adjust medications accordingly.  4. COPD (chronic obstructive pulmonary disease) Congrats on cessation!!!! Discussed multiple reason for him to stay off cigarettes. Will recheck, f/u next OV In past he did not fill Chantix rx sec to cost and I gave him handout with tips for cessation in past.    5. Elevated PSA At recent labs in 10/13 and prior, PSA was  elevated. He was aware this could be secondary to prostate cancer but refused f/u with urology. Still refuses. Asked if he is agreeable to recheck PSA today. He states he does NOT want to recheck this. Aware could have prostate cancer. Does NOT want f/u.   6. H/O HemeOccult + x 3 10/09. Refused GI eval despite being informed of risk of severe bleed, cancer etc. H/H nml 6/11 and 4/12.   WILL DISCUSS/UPDATE IMMUNIZATIONS AT NEXT OV  ROV 3 months, sooner if needed-  Signed, [address] [place], [place], [facility]  01/21/2013 9:07 AM

## 2013-01-22 ENCOUNTER — Telehealth: Payer: Self-pay | Admitting: Family Medicine

## 2013-01-22 NOTE — Telephone Encounter (Signed)
Message copied by Donne Anon on Wed Jan 22, 2013 12:50 PM ------      Message from: Allayne Butcher      Created: Tue Jan 21, 2013 10:04 AM       Last potassium 10/13 was 4.9. Will monitor this rather than changing med right now.      Tell pt A1C is Worse. MUST take Actos EVERY day or check price of Lantus.       Cholesterol is great on current med-cont this. ------

## 2013-01-22 NOTE — Telephone Encounter (Signed)
Spoke to patient.  Reinforce need to take Actos daily and watch diet.  Need to see back in three month for follow up

## 2013-01-22 NOTE — Telephone Encounter (Signed)
Message copied by Donne Anon on Wed Jan 22, 2013 12:51 PM ------      Message from: Allayne Butcher      Created: Tue Jan 21, 2013 10:04 AM       Last potassium 10/13 was 4.9. Will monitor this rather than changing med right now.      Tell pt A1C is Worse. MUST take Actos EVERY day or check price of Lantus.       Cholesterol is great on current med-cont this. ------

## 2013-04-21 ENCOUNTER — Encounter: Payer: Self-pay | Admitting: Physician Assistant

## 2013-04-21 ENCOUNTER — Ambulatory Visit (INDEPENDENT_AMBULATORY_CARE_PROVIDER_SITE_OTHER): Payer: Medicare Other | Admitting: Physician Assistant

## 2013-04-21 VITALS — BP 126/70 | HR 80 | Temp 98.4°F | Resp 20 | Ht 64.0 in | Wt 168.0 lb

## 2013-04-21 DIAGNOSIS — E119 Type 2 diabetes mellitus without complications: Secondary | ICD-10-CM

## 2013-04-21 DIAGNOSIS — R972 Elevated prostate specific antigen [PSA]: Secondary | ICD-10-CM

## 2013-04-21 DIAGNOSIS — E785 Hyperlipidemia, unspecified: Secondary | ICD-10-CM

## 2013-04-21 DIAGNOSIS — I10 Essential (primary) hypertension: Secondary | ICD-10-CM

## 2013-04-21 NOTE — Progress Notes (Signed)
Patient ID: Edward [name] MRN: [id number], DOB: 06/30/1935, 78 y.o. Date of Encounter: @DATE @  Chief Complaint:  Chief Complaint  Patient presents with  . Diabetes    HPI: 78 y.o. year old male  presents for Routine OV.   He quit smoking prior to LOV and is still not smoking!!! However, he notes he has gained weight since cessation.  He did add Actos and HAS TAKEN IT EVERY DAY FOR THE PAST 3 MONTHS!!!  He even brought in BS log- Readings are all good-range from 90 to 120s mostly.  He IS taking ALL meds as directed. No adv effects. No myalgias with pravastatin.    Past Medical History  Diagnosis Date  . Diabetes mellitus   . COPD (chronic obstructive pulmonary disease)   . Hypertension   . Allergy     Rhinitis  . Elevated lipids   . Pulmonary fibrosis   . Bronchitis   . Colon polyps   . Vitamin D deficiency   . PSA elevation      Home Meds: See attached medication section for current medication list. Any medications entered into computer today will not appear on this note's list. The medications listed below were entered prior to today. Current Outpatient Prescriptions on File Prior to Visit  Medication Sig Dispense Refill  . albuterol (PROVENTIL) (2.5 MG/3ML) 0.083% nebulizer solution Take 2.5 mg by nebulization every 6 (six) hours as needed.        Marland Kitchen aspirin 81 MG tablet Take 81 mg by mouth daily.        Marland Kitchen glimepiride (AMARYL) 4 MG tablet Take 4 mg by mouth 2 (two) times daily.       . metFORMIN (GLUMETZA) 1000 MG (MOD) 24 hr tablet Take 1,000 mg by mouth 2 (two) times daily with a meal.       . Omega-3 Fatty Acids (FISH OIL) 1000 MG CAPS Take 2 capsules by mouth daily.      . pioglitazone (ACTOS) 45 MG tablet Take 45 mg by mouth daily.      . pravastatin (PRAVACHOL) 80 MG tablet Take 80 mg by mouth at bedtime.      . quinapril (ACCUPRIL) 20 MG tablet Take 20 mg by mouth daily.        No current facility-administered medications on file prior to visit.     Allergies: No Known Allergies  History   Social History  . Marital Status: Married    Spouse Name: N/A    Number of Children: N/A  . Years of Education: N/A   Occupational History  . Not on file.   Social History Main Topics  . Smoking status: Former Smoker -- 1.50 packs/day for 60 years    Types: Cigarettes  . Smokeless tobacco: Not on file  . Alcohol Use: No  . Drug Use: No  . Sexually Active: Yes    Birth Control/ Protection: None   Other Topics Concern  . Not on file   Social History Narrative  . No narrative on file    Family History  Problem Relation Age of Onset  . CAD Other   . Diabetes Other      Review of Systems:  See HPI for pertinent ROS. All other ROS negative.    Physical Exam: Blood pressure 126/70, pulse 80, temperature 98.4 F (36.9 C), resp. rate 20, height 5\' 4"  (1.626 m), weight 168 lb (76.204 kg)., Body mass index is 28.82 kg/(m^2). General: WNWD WM. Weathered Apearance. Appears in  no acute distress. Neck: Supple. No thyromegaly. No lymphadenopathy.No carotid bruits. Lungs: Clear bilaterally to auscultation without wheezes, rales, or rhonchi. Breathing is unlabored. Heart: RRR with S1 S2. No murmurs, rubs, or gallops. Abdomen: Soft, non-tender, non-distended with normoactive bowel sounds. No hepatomegaly. No rebound/guarding. No obvious abdominal masses. Musculoskeletal:  Strength and tone normal for age. Extremities/Skin: Warm and dry. No clubbing or cyanosis. No edema. No rashes or suspicious lesions. Neuro: Alert and oriented X 3. Moves all extremities spontaneously. Gait is normal. CNII-XII grossly in tact. Psych:  Responds to questions appropriately with a normal affect. DIABETIC FOOT EXAM: SEE ATTACHED SECTION     ASSESSMENT AND PLAN:  77 y.o. year old male with   1-H/O Noncompliance secondary to finances: Currnetly taking ALL meds as directed, including ACTOS!!!  2- Past Smoker : Has remained off cigarettes for > 3 months  !!!!  3. Diabetes - Hemoglobin A1C, fingerstick - Microalbumin, urine At LOV I rec he check [email or web address].com for savings/ coupons I have discussed hte need for routine eye exams and dental exams but he has been unable to do these sec to finances.  4. Elevated lipids Had FLP/LFT 4/14. Labs were good.   3. Hypertension At goal. Cont current meds. Had BMET 4/14.  4. Elevated PSAAt lab 10/13 and prior, PSA was elevated. He was aware this could be sec to prostate cancer but refused f/u with urology. Still refuses and refuses to recheck PSA. AWARE HE COULD HAVE PROSTATE CANCER AND DOES NOT WNT TO FURHTER EVALUATE OR F/U AT ALL.  5. H/O Heme Occult Postive x 3 10/09 -Refused GI eval despite being informed of risk of severe GI Bleed, Cancer etc.  H/H nml 6/11 and 4/12.  REFUSES COLONOSCOPY  6. WILL DISCUSS/UPDATE IMMUNIZATIONS AT NEXT OV.   ROV 3 months, sooner if nbeeded.    [address] [place], [place], [facility] 04/21/2013 10:38 AM

## 2013-04-22 LAB — MICROALBUMIN, URINE: Microalb, Ur: 4.75 mg/dL — ABNORMAL HIGH (ref 0.00–1.89)

## 2013-04-23 ENCOUNTER — Telehealth: Payer: Self-pay | Admitting: Family Medicine

## 2013-04-23 NOTE — Telephone Encounter (Signed)
Pt made aware of lab results.  dietary recommendations given.  Declined RX for Januvia.  Is going to continue current meds and watch diet.  Told to follow up in 3 months

## 2013-04-23 NOTE — Telephone Encounter (Signed)
Message copied by Donne Anon on Wed Apr 23, 2013 11:37 AM ------      Message from: Allayne Butcher      Created: Tue Apr 22, 2013 11:48 AM       A1C is 8.0, which is exactly what it was at last check 4/14. I really thought it would be better. His BS Log numbers looked better than this.       A1C 8.0 indicates average BS 183.      He has had MAJOR problem with Finances Forever-unable to afford further meds.       At OV he had quit smoking which is great but he had also gained a lot of weight. Tell him to be VERY CAREFUL with what he is eating. If feels need to "have somehting in his mouth", eat carrot sticks!!!      Offer to add Januvia 100mg  one po QD # 30/3 but I donot think he will be able to pay for it. ------

## 2013-05-26 ENCOUNTER — Telehealth: Payer: Self-pay | Admitting: Physician Assistant

## 2013-05-26 MED ORDER — PRAVASTATIN SODIUM 80 MG PO TABS: 80.0000 mg | ORAL_TABLET | Freq: Every day | ORAL | Status: DC

## 2013-05-26 MED ORDER — GLIMEPIRIDE 4 MG PO TABS: 4.0000 mg | ORAL_TABLET | Freq: Two times a day (BID) | ORAL | Status: DC

## 2013-05-26 MED ORDER — PIOGLITAZONE HCL 45 MG PO TABS: 45.0000 mg | ORAL_TABLET | Freq: Every day | ORAL | Status: DC

## 2013-05-26 MED ORDER — QUINAPRIL HCL 20 MG PO TABS: 20.0000 mg | ORAL_TABLET | Freq: Every day | ORAL | Status: DC

## 2013-05-26 NOTE — Telephone Encounter (Signed)
Glimepiride 4 mg 1 BID #60 Quinapril 20 mg 1 QD #30 Pravastatin 80 mg 1 QHS #30 Pioglitazone 45 mg 1 QD #30

## 2013-05-26 NOTE — Telephone Encounter (Signed)
Medication refilled per protocol. 

## 2013-06-26 ENCOUNTER — Other Ambulatory Visit: Payer: Self-pay | Admitting: Physician Assistant

## 2013-06-26 NOTE — Telephone Encounter (Signed)
Medication refilled per protocol. 

## 2013-07-15 ENCOUNTER — Other Ambulatory Visit: Payer: Self-pay | Admitting: Family Medicine

## 2013-07-23 ENCOUNTER — Ambulatory Visit (INDEPENDENT_AMBULATORY_CARE_PROVIDER_SITE_OTHER): Payer: Medicare Other | Admitting: Physician Assistant

## 2013-07-23 ENCOUNTER — Encounter: Payer: Self-pay | Admitting: Physician Assistant

## 2013-07-23 VITALS — BP 122/70 | HR 76 | Temp 98.2°F | Resp 18 | Wt 167.0 lb

## 2013-07-23 DIAGNOSIS — E119 Type 2 diabetes mellitus without complications: Secondary | ICD-10-CM

## 2013-07-23 DIAGNOSIS — I10 Essential (primary) hypertension: Secondary | ICD-10-CM

## 2013-07-23 DIAGNOSIS — E785 Hyperlipidemia, unspecified: Secondary | ICD-10-CM

## 2013-07-23 DIAGNOSIS — D126 Benign neoplasm of colon, unspecified: Secondary | ICD-10-CM

## 2013-07-23 DIAGNOSIS — J8410 Pulmonary fibrosis, unspecified: Secondary | ICD-10-CM

## 2013-07-23 DIAGNOSIS — J449 Chronic obstructive pulmonary disease, unspecified: Secondary | ICD-10-CM

## 2013-07-23 DIAGNOSIS — R972 Elevated prostate specific antigen [PSA]: Secondary | ICD-10-CM

## 2013-07-23 DIAGNOSIS — K635 Polyp of colon: Secondary | ICD-10-CM

## 2013-07-23 LAB — COMPLETE METABOLIC PANEL WITH GFR
ALT: 18 U/L (ref 0–53)
BUN: 13 mg/dL (ref 6–23)
CO2: 31 mEq/L (ref 19–32)
Calcium: 9.9 mg/dL (ref 8.4–10.5)
Creat: 0.97 mg/dL (ref 0.50–1.35)
GFR, Est African American: 87 mL/min
GFR, Est Non African American: 76 mL/min
Glucose, Bld: 124 mg/dL — ABNORMAL HIGH (ref 70–99)
Total Bilirubin: 0.3 mg/dL (ref 0.3–1.2)

## 2013-07-23 LAB — LIPID PANEL
Cholesterol: 156 mg/dL (ref 0–200)
HDL: 44 mg/dL (ref >39)
Triglycerides: 199 mg/dL — ABNORMAL HIGH (ref <150)

## 2013-07-23 MED ORDER — GLUCOSE BLOOD VI STRP: ORAL_STRIP | Status: DC

## 2013-07-23 MED ORDER — METFORMIN HCL 1000 MG PO TABS: ORAL_TABLET | ORAL | Status: DC

## 2013-07-23 MED ORDER — PIOGLITAZONE HCL 45 MG PO TABS: 45.0000 mg | ORAL_TABLET | Freq: Every day | ORAL | Status: DC

## 2013-07-23 MED ORDER — GLIMEPIRIDE 4 MG PO TABS: 4.0000 mg | ORAL_TABLET | Freq: Two times a day (BID) | ORAL | Status: DC

## 2013-07-23 MED ORDER — PRAVASTATIN SODIUM 80 MG PO TABS: 80.0000 mg | ORAL_TABLET | Freq: Every day | ORAL | Status: DC

## 2013-07-23 MED ORDER — QUINAPRIL HCL 20 MG PO TABS: 20.0000 mg | ORAL_TABLET | Freq: Every day | ORAL | Status: DC

## 2013-07-23 NOTE — Progress Notes (Signed)
Patient ID: Edward [name] MRN: [id number], DOB: 1935/02/26, 78 y.o. Date of Encounter: @DATE @  Chief Complaint:  Chief Complaint  Patient presents with  . routine check up    is fasting    HPI: 78 y.o. year old white male  presents for routine followup office visit.  He has no complaints today he has been feeling pretty well.  He quit smoking at the beginning of 2014. He has remained off of cigarettes and says he is continued to have no smoking!!  In the past he was unable to afford to pay for activities every month. However at his last visit with me in July he reported that he had taken it daily for the 3 months prior to that visit. Today he again reports that he has been taking it every single day since his last visit. He is taking all medications as directed.  He brought in a paper with his blood sugar readings. Fasting morning readings range from 98 to 1:30. Bedtime readings read 1:30 to 150. He has only been checking at least 2 times a day.   Past Medical History  Diagnosis Date  . Diabetes mellitus   . COPD (chronic obstructive pulmonary disease)   . Hypertension   . Allergy     Rhinitis  . Elevated lipids   . Pulmonary fibrosis   . Bronchitis   . Colon polyps   . Vitamin D deficiency   . PSA elevation      Home Meds: See attached medication section for current medication list. Any medications entered into computer today will not appear on this note's list. The medications listed below were entered prior to today. Current Outpatient Prescriptions on File Prior to Visit  Medication Sig Dispense Refill  . albuterol (PROVENTIL) (2.5 MG/3ML) 0.083% nebulizer solution Take 2.5 mg by nebulization every 6 (six) hours as needed.        Marland Kitchen aspirin 81 MG tablet Take 81 mg by mouth daily.        . Omega-3 Fatty Acids (FISH OIL) 1000 MG CAPS Take 2 capsules by mouth daily.       No current facility-administered medications on file prior to visit.    Allergies: No Known  Allergies  History   Social History  . Marital Status: Married    Spouse Name: N/A    Number of Children: N/A  . Years of Education: N/A   Occupational History  . Not on file.   Social History Main Topics  . Smoking status: Former Smoker -- 1.50 packs/day for 60 years    Types: Cigarettes  . Smokeless tobacco: Not on file  . Alcohol Use: No  . Drug Use: No  . Sexual Activity: Yes    Birth Control/ Protection: None   Other Topics Concern  . Not on file   Social History Narrative  . No narrative on file    Family History  Problem Relation Age of Onset  . CAD Other   . Diabetes Other      Review of Systems:  See HPI for pertinent ROS. All other ROS negative.    Physical Exam: Blood pressure 122/70, pulse 76, temperature 98.2 F (36.8 C), temperature source Oral, resp. rate 18, weight 167 lb (75.751 kg)., Body mass index is 28.65 kg/(m^2). General: WNWD WM but "weathered appearance".Appears in no acute distress. Neck: Supple. No thyromegaly. No lymphadenopathy. No carotid bruits. Lungs: Clear bilaterally to auscultation without wheezes, rales, or rhonchi. Breathing is unlabored. Heart: RRR  with S1 S2. No murmurs, rubs, or gallops. Abdomen: Soft, non-tender, non-distended with normoactive bowel sounds. No hepatomegaly. No rebound/guarding. No obvious abdominal masses. Musculoskeletal:  Strength and tone normal for age. Extremities/Skin: Warm and dry. No clubbing or cyanosis. No edema. No rashes or suspicious lesions. Neuro: Alert and oriented X 3. Moves all extremities spontaneously. Gait is normal. CNII-XII grossly in tact. Psych:  Responds to questions appropriately with a normal affect. Diabetic foot exam: Inspection is normal. No wounds or concerning lesions. Sensation is intact.  On the right: 2+ DP 2+ PT. On the left: 1+ DP. 2+ PT.      ASSESSMENT AND PLAN:  78 y.o. year old male with  1. History of repeated noncompliance in the past secondary to finances.  Currently taking all medications as directed including his Actos. CONGRATS!!   2. History of smoking. Status post cessation earlier this year. Congratulations on staying off of the cigarettes!!   3. Diabetes His last hemoglobin A1c was 8.0. Ideally he needs to be on Januvia in addition to current medications. However he cannot afford any further medications at this time. If his A1c continues to increase, we will have to have another discussion regarding insulin. (This was discussed in the past and he refused)  - COMPLETE METABOLIC PANEL WITH GFR - Hemoglobin A1c Micro albumin was performed 04/21/13. I have discussed need for routine exams and dental exams he has been unable to duty secondary to finances.  2. Hypertension Blood pressure at goal. Continue current medications. - COMPLETE METABOLIC PANEL WITH GFR  3. Elevated lipids - COMPLETE METABOLIC PANEL WITH GFR - Lipid panel  4. COPD (chronic obstructive pulmonary disease) Congrats on smoking cessation!!  5. Pulmonary fibrosis  6. Colorectal Cancer Screening: History of Hemoccult-positive stool x3 in 10/09. Refused GI eval despite being informed of risk of severe GI bleed cancer etc. H./H. normal 03/2010 and 01/2011. Refuses colonoscopy .  7. prostate cancer screening: Had elevated PSA at lab 07/2012 and prior to this PSA was elevated. He was informed this could be secondary to prostate cancer but refused followup with urology. He still refuses to recheck PSA. He is aware that he could have prostate cancer but does not want further evaluation or followup at this AT ALL.  8. Immunizations:  I discussed influenza vaccine today. He defers. Pneumovax: Given June 2003 Tetanus: Given September 2005.  Routine office visit in 3 months or sooner if needed.    Murray Hodgkins [place], [place], [facility] 07/23/2013 2:02 PM

## 2013-07-29 ENCOUNTER — Telehealth: Payer: Self-pay | Admitting: Family Medicine

## 2013-07-29 DIAGNOSIS — E875 Hyperkalemia: Secondary | ICD-10-CM

## 2013-07-29 MED ORDER — LOSARTAN POTASSIUM 100 MG PO TABS: 100.0000 mg | ORAL_TABLET | Freq: Every day | ORAL | Status: DC

## 2013-07-29 NOTE — Telephone Encounter (Signed)
Message copied by Donne Anon on Tue Jul 29, 2013  1:47 PM ------      Message from: Allayne Butcher      Created: Thu Jul 24, 2013  8:01 AM       His potassium has been borderline so I have been monitoring . however it is now higher.      Accupril can cause high potassium. Therefore we have to stop the Accupril.      Start Losartan in its place.       Send prescription for losartan 100 mg one by mouth daily. #30 with 2 refills.      Tell patient he needs to recheck lab on new medication in 2 weeks.      PLace order for BMET      Also, tell him that despite his compliance with medications and now taking the Actos daily, his sugar is rising. His A1c is now up to 8.8.       Tell him that this is just consistent with the natural progression of diabetes and his pancreas is not working to put out insulin as it should.      He really needs to consider insulin. As far as cost, we would be able to stop all of his oral medications except for the metformin and he could use this money towards paying for the insulin. Tell him that we will discuss this further at his next office visit in 3 months. ------

## 2013-07-29 NOTE — Telephone Encounter (Signed)
Spoke to patient.  Aware to stop Accupril and new order for Losartan to pharmacy.  Understands repeat labs needed in two weeks.  Briefly discussed diabetic recommendations.  Told provider will discuss further at next OV

## 2013-09-27 ENCOUNTER — Other Ambulatory Visit: Payer: Self-pay | Admitting: Physician Assistant

## 2013-09-29 NOTE — Telephone Encounter (Signed)
Medication refilled per protocol. 

## 2013-10-23 ENCOUNTER — Ambulatory Visit (INDEPENDENT_AMBULATORY_CARE_PROVIDER_SITE_OTHER): Payer: Medicare Other | Admitting: Physician Assistant

## 2013-10-23 ENCOUNTER — Encounter: Payer: Self-pay | Admitting: Physician Assistant

## 2013-10-23 VITALS — BP 142/70 | HR 88 | Temp 98.4°F | Resp 20 | Wt 169.0 lb

## 2013-10-23 DIAGNOSIS — E119 Type 2 diabetes mellitus without complications: Secondary | ICD-10-CM

## 2013-10-23 DIAGNOSIS — J8410 Pulmonary fibrosis, unspecified: Secondary | ICD-10-CM

## 2013-10-23 DIAGNOSIS — J449 Chronic obstructive pulmonary disease, unspecified: Secondary | ICD-10-CM

## 2013-10-23 DIAGNOSIS — K635 Polyp of colon: Secondary | ICD-10-CM

## 2013-10-23 DIAGNOSIS — D126 Benign neoplasm of colon, unspecified: Secondary | ICD-10-CM

## 2013-10-23 DIAGNOSIS — Z23 Encounter for immunization: Secondary | ICD-10-CM

## 2013-10-23 DIAGNOSIS — I10 Essential (primary) hypertension: Secondary | ICD-10-CM

## 2013-10-23 DIAGNOSIS — R972 Elevated prostate specific antigen [PSA]: Secondary | ICD-10-CM

## 2013-10-23 DIAGNOSIS — E785 Hyperlipidemia, unspecified: Secondary | ICD-10-CM

## 2013-10-23 LAB — BASIC METABOLIC PANEL WITH GFR
BUN: 12 mg/dL (ref 6–23)
CALCIUM: 9.6 mg/dL (ref 8.4–10.5)
CO2: 30 mEq/L (ref 19–32)
CREATININE: 0.92 mg/dL (ref 0.50–1.35)
Chloride: 100 mEq/L (ref 96–112)
GFR, EST NON AFRICAN AMERICAN: 81 mL/min
Glucose, Bld: 160 mg/dL — ABNORMAL HIGH (ref 70–99)
Potassium: 5 mEq/L (ref 3.5–5.3)
Sodium: 137 mEq/L (ref 135–145)

## 2013-10-23 LAB — HEMOGLOBIN A1C
HEMOGLOBIN A1C: 9.4 % — AB (ref <5.7)
MEAN PLASMA GLUCOSE: 223 mg/dL — AB (ref <117)

## 2013-10-23 MED ORDER — SITAGLIPTIN PHOSPHATE 100 MG PO TABS: 100.0000 mg | ORAL_TABLET | Freq: Every day | ORAL | Status: DC

## 2013-10-23 NOTE — Progress Notes (Signed)
Patient ID: Edward [name] MRN: [id number], DOB: 24-Aug-1935, 78 y.o. Date of Encounter: @DATE @  Chief Complaint:  Chief Complaint  Patient presents with  . 3 mth check up    is fasting    HPI: 79 y.o. year old male  presents for routine followup office visit.  He is taking all of his diabetes medications as directed. No adverse effects.  At last office visit we did labs which showed high potassium. This had been borderline high prior checks and had increased even further at that lab on 07/23/13. Therefore at that time we changed his ACE inhibitor to an ARB. He was supposed to have come in for a followup BMPT but has not so we will need to check that today. He is taking all these medications as directed and is having no adverse effects.  Still taking pravastatin. No myalgias or other adverse effects.  He continues to stay off of his cigarettes.  He has no complaints today. Even with exertion he is having no angina symptoms.   Past Medical History  Diagnosis Date  . Diabetes mellitus   . COPD (chronic obstructive pulmonary disease)   . Hypertension   . Allergy     Rhinitis  . Elevated lipids   . Pulmonary fibrosis   . Bronchitis   . Colon polyps   . Vitamin D deficiency   . PSA elevation      Home Meds: See attached medication section for current medication list. Any medications entered into computer today will not appear on this note's list. The medications listed below were entered prior to today. Current Outpatient Prescriptions on File Prior to Visit  Medication Sig Dispense Refill  . aspirin 81 MG tablet Take 81 mg by mouth daily.        Marland Kitchen glimepiride (AMARYL) 4 MG tablet Take 1 tablet (4 mg total) by mouth 2 (two) times daily.  180 tablet  1  . losartan (COZAAR) 100 MG tablet TAKE ONE TABLET BY MOUTH ONCE DAILY  90 tablet  0  . metFORMIN (GLUCOPHAGE) 1000 MG tablet TAKE ONE TABLET BY MOUTH TWICE DAILY  180 tablet  1  . Omega-3 Fatty Acids (FISH OIL) 1000 MG CAPS  Take 2 capsules by mouth daily.      . pioglitazone (ACTOS) 45 MG tablet Take 1 tablet (45 mg total) by mouth daily.  30 tablet  2  . pravastatin (PRAVACHOL) 80 MG tablet Take 1 tablet (80 mg total) by mouth at bedtime.  90 tablet  1  . albuterol (PROVENTIL) (2.5 MG/3ML) 0.083% nebulizer solution Take 2.5 mg by nebulization every 6 (six) hours as needed.        Marland Kitchen glucose blood (ONE TOUCH ULTRA TEST) test strip CHECK FASTING BLOOD SUGARS EVERY MORNING  100 each  5   No current facility-administered medications on file prior to visit.    Allergies: No Known Allergies  History   Social History  . Marital Status: Married    Spouse Name: N/A    Number of Children: N/A  . Years of Education: N/A   Occupational History  . Not on file.   Social History Main Topics  . Smoking status: Former Smoker -- 1.50 packs/day for 60 years    Types: Cigarettes  . Smokeless tobacco: Not on file  . Alcohol Use: No  . Drug Use: No  . Sexual Activity: Yes    Birth Control/ Protection: None   Other Topics Concern  . Not on  file   Social History Narrative  . No narrative on file    Family History  Problem Relation Age of Onset  . CAD Other   . Diabetes Other      Review of Systems:  See HPI for pertinent ROS. All other ROS negative.    Physical Exam: Blood pressure 142/70, pulse 88, temperature 98.4 F (36.9 C), temperature source Oral, resp. rate 20, weight 169 lb (76.658 kg)., Body mass index is 28.99 kg/(m^2). General: WNWD WM, "Weathered Appearance" Appears in no acute distress. Neck: Supple. No thyromegaly. No lymphadenopathy. No carotid bruits. Lungs: Clear bilaterally to auscultation without wheezes, rales, or rhonchi. Breathing is unlabored. Heart: RRR with S1 S2. No murmurs, rubs, or gallops. Abdomen: Soft, non-tender, non-distended with normoactive bowel sounds. No hepatomegaly. No rebound/guarding. No obvious abdominal masses. Musculoskeletal:  Strength and tone normal for  age. Extremities/Skin: Warm and dry. No clubbing or cyanosis. No edema. No rashes or suspicious lesions. Neuro: Alert and oriented X 3. Moves all extremities spontaneously. Gait is normal. CNII-XII grossly in tact. Psych:  Responds to questions appropriately with a normal affect. Diabetic foot exam: Flexion is normal. No [place] concerning lesions. Sensation is intact. On the right: 2+ dorsalis pedis 2+ PT. On the left 1+ DP. 2+ PT.      ASSESSMENT AND PLAN:  79 y.o. year old male with   1. history of repeated noncompliance in the past secondary to finance. Currently taking all medications as directed including his Actos.CONGRATS!  2. history of smoking. He quit smoking January 2014. Congratulations and staying off of the cigarettes!!  3. Diabetes His last hemoglobin A1c 07/23/13 was increased to 8.8. Today we had a lengthy discussion regarding what we are going to do about this.  He reports that his diet is the same as it was a year ago and that his A1c increase is not secondary to dietary noncompliance. We discussed the natural  pathophysiology of diabetes and the progression of this disease.  I discussed his options. Option 1 is to continue current medications the same knowing that his blood sugars are way higher than they should be and that over time this is going to cause medical problems Option 2 is to add another oral medication. Have discussed that any further oral medication is going to be a brand-name medicine and is going to be expensive. We cannot use a pharmaceutical savings cards as he is Medicare. Option 3 is to be able to stop all the oral medications except for metformin and use this money towards paying for Lantus. As well, with Lantus we could get his sugars a lot closer to goal then we can with the other options.  I got out a Lantus pen and showed him how this works and it is much easier to use than the older types of insulins. Also discussed he would use this just once a day  at night.  After this lengthy discussion he has decided that he wants to add Januvia at this time. I did point out to him that usually Januvia 100 mg  Brings down A1c approximately 1 point. Discussed that his last A1c was 8.8 and if this is still about the same, then  we are going to only be getting his A1c down to 7.8. Still, for now he wants to add Januvia. I have given him 4 weeks worth of samples to use and have sent a prescription to the pharmacy.  - Hemoglobin A1c - sitaGLIPtin (JANUVIA) 100 MG tablet;  Take 1 tablet (100 mg total) by mouth daily.  Dispense: 30 tablet; Refill: 5  Microalbumin 04/21/13 I discussed need for routine exam I exams and dental exams but he hasn't been able to followup with the secondary to finances. On ARB. On statin.  2. Hypertension At LAT 07/23/13 his potassium was even higher. It had been borderline high prior to this. At that time his ACE inhibitor was stopped and he was started on ARB. We'll recheck BMP T. on this medication now. - BASIC METABOLIC PANEL WITH GFR  3. Elevated lipids Lipids were at goal and excellent at LAT 07/23/13. LFTs normal. He is on pravastatin.  4. COPD (chronic obstructive pulmonary disease) Congrats on a smoking cessation. 5. Pulmonary fibrosis  6. Elevated PSA He had elevated PSA at LAT 07/2012. Also prior to this PSA was elevated. He has been informed this could be secondary to prostate cancer but refused followup with urology. He still refuses to recheck PSA. He is aware that he could have prostate cancer but does not want further evaluation or followup with his AT ALL.  7. Colon polyps History of Hemoccult-positive stool x3 in 07/2008. Refused GI evaluation despite being informed of risk of severe GI bleed, cancer, et Ronney Asters. H./H. normal 03/2010 and 01/2011. Refuses colonoscopy.  8. Immunizations: We have discussed influenza vaccine earlier this season and he is deferred. Pneumonia vaccine: He received Pneumovax June  2003. Update today with Prevnar 13. He is agreeable with this. Tetanus: Luisa Hart September 2005. Excessive Medicare this cannot be updated in the future unless he has an actual gland.  Routine office visit 3 months or sooner if needed.   [address] [place], [place], [facility] 10/23/2013 8:35 AM

## 2013-10-28 ENCOUNTER — Telehealth: Payer: Self-pay | Admitting: Physician Assistant

## 2013-10-28 NOTE — Telephone Encounter (Signed)
Look at My last office note. At that visit, patient and I had a very long discussion about his treatment options and the need to add Lantus. I showed him the Lantus pen and the fact that it is much simpler to use than the injections in the past with large needles etc.  Have him schedule an office visit to come in to start on Lantus. He needs to have an office visit with me because we will need to stop some of the oral medications. Tell him that at that visit we can demonstrate to him and  educate him about the use of the Lantus so that he will be comfortable with that. Tell him to go ahead and schedule office visit as soon as he can rather than waiting 3 months to come back in.

## 2013-10-28 NOTE — Telephone Encounter (Signed)
S[poke to wife.  Told her next option is insulin.  appt made for patient to come see provider to discuss insulin

## 2013-10-28 NOTE — Telephone Encounter (Signed)
Pt wife is calling about Januvia for her husband states its like 53$ and he is wanting something cheaper Pharmacy [facility] in [place]   Call back number is [phone]

## 2013-10-30 ENCOUNTER — Other Ambulatory Visit: Payer: Self-pay | Admitting: Family Medicine

## 2013-10-30 ENCOUNTER — Encounter: Payer: Self-pay | Admitting: Physician Assistant

## 2013-10-30 ENCOUNTER — Ambulatory Visit (INDEPENDENT_AMBULATORY_CARE_PROVIDER_SITE_OTHER): Payer: Medicare Other | Admitting: Physician Assistant

## 2013-10-30 VITALS — BP 134/70 | HR 76 | Resp 18 | Wt 172.0 lb

## 2013-10-30 DIAGNOSIS — E119 Type 2 diabetes mellitus without complications: Secondary | ICD-10-CM

## 2013-10-30 MED ORDER — "PEN NEEDLES 5/16"" 31G X 8 MM MISC": 1.0000 | Freq: Every day | Status: DC

## 2013-10-30 MED ORDER — INSULIN GLARGINE 100 UNIT/ML SOLOSTAR PEN: PEN_INJECTOR | SUBCUTANEOUS | Status: DC

## 2013-10-30 NOTE — Progress Notes (Signed)
Patient ID: Edward [name] MRN: [id number], DOB: 07-10-1935, 79 y.o. Date of Encounter: @DATE @  Chief Complaint:  Chief Complaint  Patient presents with  . here to discuss insulin    HPI: 79 y.o. year old male  presents for followup of his diabetes.  He recently had an office visit with me at which time his A1c had gone even higher. At that visit I showed him a Lantus pen and had a very long discussion with him regarding his options. Still, at the end of that visit he wanted to try Januvia. However, he then called stating that the Januvia was extremely expensive with his insurance plan. He was then instructed to schedule a follow up office visit to come in to discuss starting Lantus. He is here for that visit now.   Past Medical History  Diagnosis Date  . Diabetes mellitus   . COPD (chronic obstructive pulmonary disease)   . Hypertension   . Allergy     Rhinitis  . Elevated lipids   . Pulmonary fibrosis   . Bronchitis   . Colon polyps   . Vitamin D deficiency   . PSA elevation      Home Meds: See attached medication section for current medication list. Any medications entered into computer today will not appear on this note's list. The medications listed below were entered prior to today. Current Outpatient Prescriptions on File Prior to Visit  Medication Sig Dispense Refill  . albuterol (PROVENTIL) (2.5 MG/3ML) 0.083% nebulizer solution Take 2.5 mg by nebulization every 6 (six) hours as needed.        Marland Kitchen aspirin 81 MG tablet Take 81 mg by mouth daily.        Marland Kitchen glucose blood (ONE TOUCH ULTRA TEST) test strip CHECK FASTING BLOOD SUGARS EVERY MORNING  100 each  5  . losartan (COZAAR) 100 MG tablet TAKE ONE TABLET BY MOUTH ONCE DAILY  90 tablet  0  . metFORMIN (GLUCOPHAGE) 1000 MG tablet TAKE ONE TABLET BY MOUTH TWICE DAILY  180 tablet  1  . Omega-3 Fatty Acids (FISH OIL) 1000 MG CAPS Take 2 capsules by mouth daily.      . pravastatin (PRAVACHOL) 80 MG tablet Take 1 tablet (80  mg total) by mouth at bedtime.  90 tablet  1   No current facility-administered medications on file prior to visit.    Allergies:  Allergies  Allergen Reactions  . Ace Inhibitors     Hyperkalemia--07/23/2013    History   Social History  . Marital Status: Married    Spouse Name: N/A    Number of Children: N/A  . Years of Education: N/A   Occupational History  . Not on file.   Social History Main Topics  . Smoking status: Former Smoker -- 1.50 packs/day for 60 years    Types: Cigarettes  . Smokeless tobacco: Not on file  . Alcohol Use: No  . Drug Use: No  . Sexual Activity: Yes    Birth Control/ Protection: None   Other Topics Concern  . Not on file   Social History Narrative  . No narrative on file    Family History  Problem Relation Age of Onset  . CAD Other   . Diabetes Other      Review of Systems:  See HPI for pertinent ROS. All other ROS negative.    Physical Exam: Blood pressure 134/70, pulse 76, resp. rate 18, weight 172 lb (78.019 kg)., Body mass index is  29.51 kg/(m^2). General: WNWD WM ("weathered appearance" )Appears in no acute distress. Neck: Supple. No thyromegaly. No lymphadenopathy. Lungs: Clear bilaterally to auscultation without wheezes, rales, or rhonchi. Breathing is unlabored. Heart: RRR with S1 S2. No murmurs, rubs, or gallops. Abdomen: Soft, non-tender, non-distended with normoactive bowel sounds. No hepatomegaly. No rebound/guarding. No obvious abdominal masses. Musculoskeletal:  Strength and tone normal for age. Extremities/Skin: Warm and dry. No clubbing or cyanosis. No edema. No rashes or suspicious lesions. Neuro: Alert and oriented X 3. Moves all extremities spontaneously. Gait is normal. CNII-XII grossly in tact. Psych:  Responds to questions appropriately with a normal affect.     ASSESSMENT AND PLAN:  79 y.o. year old male with  1. Diabetes - Insulin Glargine (LANTUS) 100 UNIT/ML Solostar Pen; Start with 15 units each  night. Increase by one one unit each night, as directed. Up to 40 units each night.  Dispense: 15 mL; Refill: 11  40 minutes was spent in face-to-face patient contact time discussing all of this.  He is to stop the following medications: Glimepiride / Amaryl pioglitazone Amie [place] Januvia  We will continue the metformin at the current dose of 1000 mg twice a day  He is going to give Lantus every night. Starting tonight, he is going to administer  15 units of Lantus. I have given him a blood sugar log sheet which has the month written at the top and the date written along the left side of the column. January: 28 (today)--give 15 units 29th--16 units 30th--17 units 31st--18 untis  February: 1st--19 units And so on He will schedule f/u OV with me on Mon Feb 9th.  Feb 8th--should be at 26 units.   I wrote out  that he is to check fasting morning blood sugars every morning and document on the log sheet. He will continue to increase Lantus by one unit each night until his fasting morning blood sugar gets to 80 - 120. Discussed that if his fasting morning blood sugar gets down towards 120 then stop titrating up the Lantus dose.  The LPN working with me spent further time with him giving him a Lantus pen sample and demonstrating to him how to dial up to the appropriate number of units and demonstrating how to do the injections properly. All questions were answered. He voiced understanding of all of the above. Follow up with me on Monday, February 9 or sooner if needed.  Signed, [address] [place], [place], [facility] 10/30/2013 9:04 AM

## 2013-11-04 ENCOUNTER — Emergency Department (HOSPITAL_COMMUNITY)
Admission: EM | Admit: 2013-11-04 | Discharge: 2013-11-04 | Disposition: A | Discharge status: Home / Self Care | Payer: Medicare Other | Attending: Emergency Medicine | Admitting: Emergency Medicine

## 2013-11-04 ENCOUNTER — Encounter (HOSPITAL_COMMUNITY): Payer: Self-pay | Admitting: Emergency Medicine

## 2013-11-04 ENCOUNTER — Emergency Department (HOSPITAL_COMMUNITY): Payer: Medicare Other

## 2013-11-04 DIAGNOSIS — Z792 Long term (current) use of antibiotics: Secondary | ICD-10-CM | POA: Insufficient documentation

## 2013-11-04 DIAGNOSIS — Z7982 Long term (current) use of aspirin: Secondary | ICD-10-CM | POA: Insufficient documentation

## 2013-11-04 DIAGNOSIS — F17200 Nicotine dependence, unspecified, uncomplicated: Secondary | ICD-10-CM | POA: Insufficient documentation

## 2013-11-04 DIAGNOSIS — Z8639 Personal history of other endocrine, nutritional and metabolic disease: Secondary | ICD-10-CM | POA: Insufficient documentation

## 2013-11-04 DIAGNOSIS — Z794 Long term (current) use of insulin: Secondary | ICD-10-CM | POA: Insufficient documentation

## 2013-11-04 DIAGNOSIS — E119 Type 2 diabetes mellitus without complications: Secondary | ICD-10-CM | POA: Insufficient documentation

## 2013-11-04 DIAGNOSIS — Z79899 Other long term (current) drug therapy: Secondary | ICD-10-CM | POA: Insufficient documentation

## 2013-11-04 DIAGNOSIS — Z860100 Personal history of colon polyps, unspecified: Secondary | ICD-10-CM | POA: Insufficient documentation

## 2013-11-04 DIAGNOSIS — IMO0002 Reserved for concepts with insufficient information to code with codable children: Secondary | ICD-10-CM | POA: Insufficient documentation

## 2013-11-04 DIAGNOSIS — I10 Essential (primary) hypertension: Secondary | ICD-10-CM | POA: Insufficient documentation

## 2013-11-04 DIAGNOSIS — Z87891 Personal history of nicotine dependence: Secondary | ICD-10-CM | POA: Insufficient documentation

## 2013-11-04 DIAGNOSIS — J441 Chronic obstructive pulmonary disease with (acute) exacerbation: Secondary | ICD-10-CM | POA: Insufficient documentation

## 2013-11-04 DIAGNOSIS — E785 Hyperlipidemia, unspecified: Secondary | ICD-10-CM | POA: Insufficient documentation

## 2013-11-04 DIAGNOSIS — R609 Edema, unspecified: Secondary | ICD-10-CM | POA: Insufficient documentation

## 2013-11-04 LAB — CBC WITH DIFFERENTIAL/PLATELET
Basophils Absolute: 0 10*3/uL (ref 0.0–0.1)
Basophils Relative: 0 % (ref 0–1)
EOS ABS: 0.3 10*3/uL (ref 0.0–0.7)
EOS PCT: 3 % (ref 0–5)
HCT: 39.7 % (ref 39.0–52.0)
Hemoglobin: 13.5 g/dL (ref 13.0–17.0)
LYMPHS ABS: 1 10*3/uL (ref 0.7–4.0)
Lymphocytes Relative: 10 % — ABNORMAL LOW (ref 12–46)
MCH: 30.2 pg (ref 26.0–34.0)
MCHC: 34 g/dL (ref 30.0–36.0)
MCV: 88.8 fL (ref 78.0–100.0)
Monocytes Absolute: 1 10*3/uL (ref 0.1–1.0)
Monocytes Relative: 10 % (ref 3–12)
NEUTROS PCT: 77 % (ref 43–77)
Neutro Abs: 7.7 10*3/uL (ref 1.7–7.7)
PLATELETS: 308 10*3/uL (ref 150–400)
RBC: 4.47 MIL/uL (ref 4.22–5.81)
RDW: 13.9 % (ref 11.5–15.5)
WBC: 9.9 10*3/uL (ref 4.0–10.5)

## 2013-11-04 LAB — BASIC METABOLIC PANEL
BUN: 13 mg/dL (ref 6–23)
CALCIUM: 9.2 mg/dL (ref 8.4–10.5)
CO2: 26 mEq/L (ref 19–32)
Chloride: 96 mEq/L (ref 96–112)
Creatinine, Ser: 0.8 mg/dL (ref 0.50–1.35)
GFR, EST NON AFRICAN AMERICAN: 85 mL/min — AB (ref >90)
GLUCOSE: 192 mg/dL — AB (ref 70–99)
Potassium: 4.5 mEq/L (ref 3.7–5.3)
SODIUM: 135 meq/L — AB (ref 137–147)

## 2013-11-04 LAB — D-DIMER, QUANTITATIVE (NOT AT ARMC): D DIMER QUANT: 0.36 ug{FEU}/mL (ref 0.00–0.48)

## 2013-11-04 LAB — PRO B NATRIURETIC PEPTIDE: Pro B Natriuretic peptide (BNP): 68.9 pg/mL (ref 0–450)

## 2013-11-04 LAB — TROPONIN I: Troponin I: <0.3 ng/mL (ref <0.30)

## 2013-11-04 MED ORDER — PREDNISONE 50 MG PO TABS
60.0000 mg | ORAL_TABLET | Freq: Once | ORAL | Status: AC
  Administered 2013-11-04: 60 mg via ORAL
  Filled 2013-11-04 (×2): qty 1

## 2013-11-04 MED ORDER — ALBUTEROL SULFATE HFA 108 (90 BASE) MCG/ACT IN AERS: 2.0000 | INHALATION_SPRAY | Freq: Four times a day (QID) | RESPIRATORY_TRACT | Status: DC | PRN

## 2013-11-04 MED ORDER — IPRATROPIUM-ALBUTEROL 0.5-2.5 (3) MG/3ML IN SOLN
3.0000 mL | Freq: Once | RESPIRATORY_TRACT | Status: AC
  Administered 2013-11-04: 3 mL via RESPIRATORY_TRACT
  Filled 2013-11-04: qty 3

## 2013-11-04 MED ORDER — PREDNISONE 50 MG PO TABS: ORAL_TABLET | ORAL | Status: DC

## 2013-11-04 MED ORDER — DOXYCYCLINE HYCLATE 100 MG PO CAPS: 100.0000 mg | ORAL_CAPSULE | Freq: Two times a day (BID) | ORAL | Status: DC

## 2013-11-04 NOTE — Discharge Instructions (Signed)
Chronic Obstructive Pulmonary Disease  Chronic obstructive pulmonary disease (COPD) is a common lung condition in which airflow from the lungs is limited. COPD is a general term that can be used to describe many different lung problems that limit airflow, including both chronic bronchitis and emphysema.  If you have COPD, your lung function will probably never return to normal, but there are measures you can take to improve lung function and make yourself feel better.   CAUSES   · Smoking (common).    · Exposure to secondhand smoke.    · Genetic problems.  · Chronic inflammatory lung diseases or recurrent infections.  SYMPTOMS   · Shortness of breath, especially with physical activity.    · Deep, persistent (chronic) cough with a large amount of thick mucus.    · Wheezing.    · Rapid breaths (tachypnea).    · Gray or bluish discoloration (cyanosis) of the skin, especially in fingers, toes, or lips.    · Fatigue.    · Weight loss.    · Frequent infections or episodes when breathing symptoms become much worse (exacerbations).    · Chest tightness.  DIAGNOSIS   Your healthcare provider will take a medical history and perform a physical examination to make the initial diagnosis.  Additional tests for COPD may include:   · Lung (pulmonary) function tests.  · Chest X-ray.  · CT scan.  · Blood tests.  TREATMENT   Treatment available to help you feel better when you have COPD include:   · Inhaler and nebulizer medicines. These help manage the symptoms of COPD and make your breathing more comfortable  · Supplemental oxygen. Supplemental oxygen is only helpful if you have a low oxygen level in your blood.    · Exercise and physical activity. These are beneficial for nearly all people with COPD. Some people may also benefit from a pulmonary rehabilitation program.  HOME CARE INSTRUCTIONS   · Take all medicines (inhaled or pills) as directed by your health care provider.  · Only take over-the-counter or prescription medicines  for pain, fever, or discomfort as directed by your health care provider.    · Avoid over-the-counter medicines or cough syrups that dry up your airway (such as antihistamines) and slow down the elimination of secretions unless instructed otherwise by your healthcare provider.    · If you are a smoker, the most important thing that you can do is stop smoking. Continuing to smoke will cause further lung damage and breathing trouble. Ask your health care provider for help with quitting smoking. He or she can direct you to community resources or hospitals that provide support.  · Avoid exposure to irritants such as smoke, chemicals, and fumes that aggravate your breathing.  · Use oxygen therapy and pulmonary rehabilitation if directed by your health care provider. If you require home oxygen therapy, ask your healthcare provider whether you should purchase a pulse oximeter to measure your oxygen level at home.    · Avoid contact with individuals who have a contagious illness.  · Avoid extreme temperature and humidity changes.  · Eat healthy foods. Eating smaller, more frequent meals and resting before meals may help you maintain your strength.  · Stay active, but balance activity with periods of rest. Exercise and physical activity will help you maintain your ability to do things you want to do.  · Preventing infection and hospitalization is very important when you have COPD. Make sure to receive all the vaccines your health care provider recommends, especially the pneumococcal and influenza vaccines. Ask your healthcare provider whether you   need a pneumonia vaccine.  · Learn and use relaxation techniques to manage stress.  · Learn and use controlled breathing techniques as directed by your health care provider. Controlled breathing techniques include:    · Pursed lip breathing. Start by breathing in (inhaling) through your nose for 1 second. Then, purse your lips as if you were going to whistle and breathe out (exhale)  through the pursed lips for 2 seconds.    · Diaphragmatic breathing. Start by putting one hand on your abdomen just above your waist. Inhale slowly through your nose. The hand on your abdomen should move out. Then purse your lips and exhale slowly. You should be able to feel the hand on your abdomen moving in as you exhale.    · Learn and use controlled coughing to clear mucus from your lungs. Controlled coughing is a series of short, progressive coughs. The steps of controlled coughing are:    1. Lean your head slightly forward.    2. Breathe in deeply using diaphragmatic breathing.    3. Try to hold your breath for 3 seconds.    4. Keep your mouth slightly open while coughing twice.    5. Spit any mucus out into a tissue.    6. Rest and repeat the steps once or twice as needed.  SEEK MEDICAL CARE IF:   · You are coughing up more mucus than usual.    · There is a change in the color or thickness of your mucus.    · Your breathing is more labored than usual.    · Your breathing is faster than usual.    SEEK IMMEDIATE MEDICAL CARE IF:   · You have shortness of breath while you are resting.    · You have shortness of breath that prevents you from:  · Being able to talk.    · Performing your usual physical activities.    · You have chest pain lasting longer than 5 minutes.    · Your skin color is more cyanotic than usual.  · You measure low oxygen saturations for longer than 5 minutes with a pulse oximeter.  MAKE SURE YOU:   · Understand these instructions.  · Will watch your condition.  · Will get help right away if you are not doing well or get worse.  Document Released: 06/28/2005 Document Revised: 07/09/2013 Document Reviewed: 05/15/2013  [employer]® Patient Information ©2014 [employer].

## 2013-11-04 NOTE — ED Notes (Signed)
Patient reports cough and shortness of breath x 3 days.

## 2013-11-04 NOTE — ED Notes (Signed)
Pt up to restroom.  Oxygen sats  at 87-89% O2 while ambulating.

## 2013-11-04 NOTE — ED Notes (Signed)
Ambulated pt, O2 sats were 87-90% while walking around nurses station.  Pt tolerated well.

## 2013-11-04 NOTE — ED Provider Notes (Signed)
CSN: [id number]     Arrival date & time 11/04/13  0631 History   First MD Initiated Contact with Patient 11/04/13 [phone]     Chief Complaint  Patient presents with  . Shortness of Breath  . Cough   (Consider location/radiation/quality/duration/timing/severity/associated sxs/prior Treatment) HPI Comments: Patient presents with three-day history of worsening cough and shortness of breath. He has a history of COPD and recently status looking in April. He's been using his nebulized albuterol at home all night long. He denies any chest pain or fever. His cough is productive of brown mucus. Denies any leg pain or leg swelling. Denies any abdominal pain nausea or vomiting. He denies any sick contacts.  he did not get a flu shot. he endorses some sore throat, congestion and runny nose as well.  The history is provided by the patient.    Past Medical History  Diagnosis Date  . Diabetes mellitus   . COPD (chronic obstructive pulmonary disease)   . Hypertension   . Allergy     Rhinitis  . Elevated lipids   . Pulmonary fibrosis   . Bronchitis   . Colon polyps   . Vitamin D deficiency   . PSA elevation    Past Surgical History  Procedure Laterality Date  . Cataract extraction w/phaco  06/25/2012    Procedure: CATARACT EXTRACTION PHACO AND INTRAOCULAR LENS PLACEMENT (IOC);  Surgeon: Elta Guadeloupe T. Gershon Crane, MD;  Location: [facility];  Service: Ophthalmology;  Laterality: Left;  CDE=19.01  . Cataract extraction w/phaco  07/09/2012    Procedure: CATARACT EXTRACTION PHACO AND INTRAOCULAR LENS PLACEMENT (IOC);  Surgeon: Elta Guadeloupe T. Gershon Crane, MD;  Location: [facility];  Service: Ophthalmology;  Laterality: Right;  CDE: 20.09   Family History  Problem Relation Age of Onset  . CAD Other   . Diabetes Other    History  Substance Use Topics  . Smoking status: Former Smoker -- 1.50 packs/day for 60 years    Types: Cigarettes  . Smokeless tobacco: Not on file  . Alcohol Use: No    Review of Systems  Constitutional:  Negative for fever, activity change and appetite change.  HENT: Positive for congestion and rhinorrhea.   Respiratory: Positive for cough and shortness of breath. Negative for chest tightness.   Cardiovascular: Negative for chest pain.  Gastrointestinal: Negative for nausea, vomiting and abdominal pain.  Genitourinary: Negative for dysuria and hematuria.  Musculoskeletal: Negative for back pain.  Skin: Negative for rash.  Neurological: Negative for dizziness, weakness and headaches.  A complete 10 system review of systems was obtained and all systems are negative except as noted in the HPI and PMH.    Allergies  Ace inhibitors  Home Medications   Current Outpatient Rx  Name  Route  Sig  Dispense  Refill  . albuterol (PROVENTIL) (2.5 MG/3ML) 0.083% nebulizer solution   Nebulization   Take 2.5 mg by nebulization every 6 (six) hours as needed.           Marland Kitchen aspirin 81 MG tablet   Oral   Take 81 mg by mouth daily.           . Insulin Glargine (LANTUS) 100 UNIT/ML Solostar Pen      Start with 15 units each night. Increase by one one unit each night, as directed. Up to 40 units each night.   15 mL   11   . losartan (COZAAR) 100 MG tablet      TAKE ONE TABLET BY MOUTH ONCE DAILY  90 tablet   0   . metFORMIN (GLUCOPHAGE) 1000 MG tablet      TAKE ONE TABLET BY MOUTH TWICE DAILY   180 tablet   1   . Omega-3 Fatty Acids (FISH OIL) 1000 MG CAPS   Oral   Take 2 capsules by mouth daily.         . pravastatin (PRAVACHOL) 80 MG tablet   Oral   Take 1 tablet (80 mg total) by mouth at bedtime.   90 tablet   1   . albuterol ([name];VENTOLIN HFA) 108 (90 BASE) MCG/ACT inhaler   Inhalation   Inhale 2 puffs into the lungs every 6 (six) hours as needed for wheezing or shortness of breath.   1 Inhaler   2   . albuterol ([name];VENTOLIN HFA) 108 (90 BASE) MCG/ACT inhaler   Inhalation   Inhale 2 puffs into the lungs every 6 (six) hours as needed for wheezing or  shortness of breath.   1 Inhaler   2   . doxycycline (VIBRAMYCIN) 100 MG capsule   Oral   Take 1 capsule (100 mg total) by mouth 2 (two) times daily.   20 capsule   0   . predniSONE (DELTASONE) 50 MG tablet      1 tablet PO daily   5 tablet   0   . predniSONE (DELTASONE) 50 MG tablet      1 tablet PO daily   5 tablet   0    BP 154/73  Pulse 107  Temp(Src) 98.5 F (36.9 C) (Oral)  Resp 16  Ht 5\' 5"  (1.651 m)  Wt 170 lb (77.111 kg)  BMI 28.29 kg/m2  SpO2 90% Physical Exam  Constitutional: He is oriented to person, place, and time. He appears well-developed and well-nourished. No distress.  Speaking in full sentences  HENT:  Head: Normocephalic.  Mouth/Throat: Oropharynx is clear and moist. No oropharyngeal exudate.  Eyes: Conjunctivae and EOM are normal. Pupils are equal, round, and reactive to light.  Neck: Normal range of motion. Neck supple.  Cardiovascular: Normal rate, regular rhythm and normal heart sounds.   No murmur heard. Pulmonary/Chest: Effort normal. No respiratory distress. He has wheezes.  Scattered inspiratory and expiratory wheezing  Abdominal: Soft. There is no tenderness. There is no rebound and no guarding.  Musculoskeletal: Normal range of motion. He exhibits edema. He exhibits no tenderness.  Trace edema to mid shin bilaterally  Neurological: He is alert and oriented to person, place, and time. No cranial nerve deficit. He exhibits normal muscle tone. Coordination normal.  Skin: Skin is warm.    ED Course  Procedures (including critical care time) Labs Review Labs Reviewed  CBC WITH DIFFERENTIAL - Abnormal; Notable for the following:    Lymphocytes Relative 10 (*)    All other components within normal limits  BASIC METABOLIC PANEL - Abnormal; Notable for the following:    Sodium 135 (*)    Glucose, Bld 192 (*)    GFR calc non Af Amer 85 (*)    All other components within normal limits  TROPONIN I  D-DIMER, QUANTITATIVE  PRO B  NATRIURETIC PEPTIDE   Imaging Review Dg Chest Portable 1 View  11/04/2013   CLINICAL DATA:  Shortness of breath.  Cough.  Diabetes.  COPD.  EXAM: PORTABLE CHEST - 1 VIEW  COMPARISON:  DG CHEST 2 VIEW dated 06/21/2012  FINDINGS: Apical lordotic patient positioning. Midline trachea. Normal heart size and mediastinal contours. Mild pleural thickening blunts  the costophrenic angles bilaterally. No pneumothorax. Mild hyperinflation and chronic interstitial thickening. Mild bibasilar volume loss, without lobar consolidation.  IMPRESSION: COPD/chronic bronchitis. No acute superimposed process.   Electronically Signed   By: Abigail Miyamoto M.D.   On: 11/04/2013 07:27    EKG Interpretation    Date/Time:  Tuesday November 04 2013 06:58:57 EST Ventricular Rate:  103 PR Interval:  132 QRS Duration: 66 QT Interval:  328 QTC Calculation: 429 R Axis:   72 Text Interpretation:  Sinus tachycardia Otherwise normal ECG When compared with ECG of 21-Jun-2012 13:27, T wave inversion no longer evident in Inferior leads No significant change was found Confirmed by Shakendra Griffeth  MD, Fay Bagg ([id number]) on 11/04/2013 7:14:23 AM            MDM   1. COPD exacerbation    Difficulty breathing x 3 days with cough and congestion.  No chest pain.  Suspect COPD exacerbation.  Nebulizers, steroids, CXR. X-ray shows no infiltrate. Patient's wheezing has improved after nebulizers and steroids. EKG is unchanged. Oxygen will be weaned and ambulation will be attempted. D-dimer and troponin negative. Patient ambulatory without complaint. Denies chest pain or shortness of breath. His saturation stayed above 90%. He received a total of 3 DuoNeb and steroids in ED. He states then better and wants to go home. Observation admission discussed with patient which he declined.  We will treat with steroids, albuterol, antibiotics giving his smoking history. Return precautions discussed.  BP 154/73  Pulse 107  Temp(Src) 98.5 F (36.9 C)  (Oral)  Resp 16  Ht 5\' 5"  (1.651 m)  Wt 170 lb (77.111 kg)  BMI 28.29 kg/m2  SpO2 90%   Ezequiel Essex, MD 11/04/13 1419

## 2013-11-06 ENCOUNTER — Emergency Department (HOSPITAL_COMMUNITY)
Admission: EM | Admit: 2013-11-06 | Discharge: 2013-11-06 | Disposition: A | Discharge status: Home / Self Care | Payer: Medicare Other | Attending: Emergency Medicine | Admitting: Emergency Medicine

## 2013-11-06 ENCOUNTER — Emergency Department (HOSPITAL_COMMUNITY): Payer: Medicare Other

## 2013-11-06 ENCOUNTER — Encounter (HOSPITAL_COMMUNITY): Payer: Self-pay | Admitting: Emergency Medicine

## 2013-11-06 DIAGNOSIS — E119 Type 2 diabetes mellitus without complications: Secondary | ICD-10-CM | POA: Insufficient documentation

## 2013-11-06 DIAGNOSIS — Z794 Long term (current) use of insulin: Secondary | ICD-10-CM | POA: Insufficient documentation

## 2013-11-06 DIAGNOSIS — IMO0002 Reserved for concepts with insufficient information to code with codable children: Secondary | ICD-10-CM | POA: Insufficient documentation

## 2013-11-06 DIAGNOSIS — Z860100 Personal history of colon polyps, unspecified: Secondary | ICD-10-CM | POA: Insufficient documentation

## 2013-11-06 DIAGNOSIS — I10 Essential (primary) hypertension: Secondary | ICD-10-CM | POA: Insufficient documentation

## 2013-11-06 DIAGNOSIS — Z7982 Long term (current) use of aspirin: Secondary | ICD-10-CM | POA: Insufficient documentation

## 2013-11-06 DIAGNOSIS — J449 Chronic obstructive pulmonary disease, unspecified: Secondary | ICD-10-CM

## 2013-11-06 DIAGNOSIS — E785 Hyperlipidemia, unspecified: Secondary | ICD-10-CM | POA: Insufficient documentation

## 2013-11-06 DIAGNOSIS — R739 Hyperglycemia, unspecified: Secondary | ICD-10-CM

## 2013-11-06 DIAGNOSIS — Z87891 Personal history of nicotine dependence: Secondary | ICD-10-CM | POA: Insufficient documentation

## 2013-11-06 DIAGNOSIS — J441 Chronic obstructive pulmonary disease with (acute) exacerbation: Secondary | ICD-10-CM | POA: Insufficient documentation

## 2013-11-06 LAB — CBC WITH DIFFERENTIAL/PLATELET
BASOS PCT: 0 % (ref 0–1)
Basophils Absolute: 0 10*3/uL (ref 0.0–0.1)
EOS ABS: 0.1 10*3/uL (ref 0.0–0.7)
Eosinophils Relative: 1 % (ref 0–5)
HEMATOCRIT: 39.1 % (ref 39.0–52.0)
Hemoglobin: 13.1 g/dL (ref 13.0–17.0)
Lymphocytes Relative: 7 % — ABNORMAL LOW (ref 12–46)
Lymphs Abs: 1 10*3/uL (ref 0.7–4.0)
MCH: 29.8 pg (ref 26.0–34.0)
MCHC: 33.5 g/dL (ref 30.0–36.0)
MCV: 88.9 fL (ref 78.0–100.0)
MONO ABS: 1 10*3/uL (ref 0.1–1.0)
Monocytes Relative: 6 % (ref 3–12)
Neutro Abs: 13.2 10*3/uL — ABNORMAL HIGH (ref 1.7–7.7)
Neutrophils Relative %: 86 % — ABNORMAL HIGH (ref 43–77)
Platelets: 350 10*3/uL (ref 150–400)
RBC: 4.4 MIL/uL (ref 4.22–5.81)
RDW: 13.9 % (ref 11.5–15.5)
WBC: 15.3 10*3/uL — ABNORMAL HIGH (ref 4.0–10.5)

## 2013-11-06 LAB — BASIC METABOLIC PANEL
BUN: 28 mg/dL — AB (ref 6–23)
CALCIUM: 9.2 mg/dL (ref 8.4–10.5)
CO2: 24 mEq/L (ref 19–32)
CREATININE: 1.03 mg/dL (ref 0.50–1.35)
Chloride: 92 mEq/L — ABNORMAL LOW (ref 96–112)
GFR, EST AFRICAN AMERICAN: 79 mL/min — AB (ref >90)
GFR, EST NON AFRICAN AMERICAN: 68 mL/min — AB (ref >90)
Glucose, Bld: 410 mg/dL — ABNORMAL HIGH (ref 70–99)
Potassium: 4.5 mEq/L (ref 3.7–5.3)
Sodium: 132 mEq/L — ABNORMAL LOW (ref 137–147)

## 2013-11-06 LAB — GLUCOSE, CAPILLARY: Glucose-Capillary: 444 mg/dL — ABNORMAL HIGH (ref 70–99)

## 2013-11-06 MED ORDER — ALBUTEROL (5 MG/ML) CONTINUOUS INHALATION SOLN
10.0000 mg/h | INHALATION_SOLUTION | Freq: Once | RESPIRATORY_TRACT | Status: AC
  Administered 2013-11-06: 10 mg/h via RESPIRATORY_TRACT
  Filled 2013-11-06: qty 20

## 2013-11-06 MED ORDER — IPRATROPIUM-ALBUTEROL 0.5-2.5 (3) MG/3ML IN SOLN: 3.0000 mL | RESPIRATORY_TRACT | Status: DC

## 2013-11-06 MED ORDER — PREDNISONE 50 MG PO TABS
60.0000 mg | ORAL_TABLET | Freq: Once | ORAL | Status: AC
  Administered 2013-11-06: 60 mg via ORAL
  Filled 2013-11-06 (×2): qty 1

## 2013-11-06 MED ORDER — ALBUTEROL SULFATE (2.5 MG/3ML) 0.083% IN NEBU: 5.0000 mg | INHALATION_SOLUTION | Freq: Once | RESPIRATORY_TRACT | Status: DC

## 2013-11-06 MED ORDER — IPRATROPIUM BROMIDE 0.02 % IN SOLN: 0.5000 mg | Freq: Once | RESPIRATORY_TRACT | Status: DC

## 2013-11-06 MED ORDER — ALBUTEROL SULFATE (2.5 MG/3ML) 0.083% IN NEBU: 2.5000 mg | INHALATION_SOLUTION | Freq: Once | RESPIRATORY_TRACT | Status: DC

## 2013-11-06 MED ORDER — SODIUM CHLORIDE 0.9 % IV BOLUS (SEPSIS)
1000.0000 mL | Freq: Once | INTRAVENOUS | Status: AC
  Administered 2013-11-06: 1000 mL via INTRAVENOUS

## 2013-11-06 MED ORDER — IPRATROPIUM-ALBUTEROL 0.5-2.5 (3) MG/3ML IN SOLN
3.0000 mL | Freq: Once | RESPIRATORY_TRACT | Status: DC
  Filled 2013-11-06: qty 3

## 2013-11-06 MED ORDER — INSULIN ASPART 100 UNIT/ML ~~LOC~~ SOLN
5.0000 [IU] | Freq: Once | SUBCUTANEOUS | Status: AC
  Administered 2013-11-06: 5 [IU] via SUBCUTANEOUS
  Filled 2013-11-06: qty 1

## 2013-11-06 MED ORDER — ALBUTEROL SULFATE (2.5 MG/3ML) 0.083% IN NEBU
2.5000 mg | INHALATION_SOLUTION | Freq: Once | RESPIRATORY_TRACT | Status: AC
  Administered 2013-11-06: 2.5 mg via RESPIRATORY_TRACT
  Filled 2013-11-06: qty 3

## 2013-11-06 MED ORDER — IPRATROPIUM-ALBUTEROL 0.5-2.5 (3) MG/3ML IN SOLN
3.0000 mL | Freq: Once | RESPIRATORY_TRACT | Status: AC
  Administered 2013-11-06: 3 mL via RESPIRATORY_TRACT
  Filled 2013-11-06: qty 3

## 2013-11-06 NOTE — ED Notes (Signed)
EDP made aware of pt's CBG result

## 2013-11-06 NOTE — Discharge Instructions (Signed)
BE SURE TO CHECK YOUR SUGAR FREQUENTLY RETURN FOR WORSENED BREATHING, ANY NEW CHEST PAIN OVER THE NEXT 24-48 HOURS

## 2013-11-06 NOTE — ED Notes (Signed)
Pt reporting SOB.  Reports non-productive cough.  Symptoms 2-3 days ago.

## 2013-11-06 NOTE — ED Notes (Signed)
Pt ambulated around nurses desk without any difficultly, pt's sats on RA after returning to bed 87% but has increased to 92% on RA with rest, EDP made aware

## 2013-11-06 NOTE — ED Provider Notes (Signed)
CSN: [id number]     Arrival date & time 11/06/13  0541 History   First MD Initiated Contact with Patient 11/06/13 [phone]     Chief Complaint  Patient presents with  . Shortness of Breath    Patient is a 79 y.o. male presenting with shortness of breath. The history is provided by the patient.  Shortness of Breath Severity:  Moderate Onset quality:  Gradual Duration:  1 week Timing:  Intermittent Progression:  Worsening Chronicity:  Recurrent Relieved by:  Rest Worsened by:  Activity Associated symptoms: cough and wheezing   Associated symptoms: no abdominal pain, no chest pain, no fever and no hemoptysis     Past Medical History  Diagnosis Date  . Diabetes mellitus   . COPD (chronic obstructive pulmonary disease)   . Hypertension   . Allergy     Rhinitis  . Elevated lipids   . Pulmonary fibrosis   . Bronchitis   . Colon polyps   . Vitamin D deficiency   . PSA elevation    Past Surgical History  Procedure Laterality Date  . Cataract extraction w/phaco  06/25/2012    Procedure: CATARACT EXTRACTION PHACO AND INTRAOCULAR LENS PLACEMENT (IOC);  Surgeon: Elta Guadeloupe T. Gershon Crane, MD;  Location: [facility];  Service: Ophthalmology;  Laterality: Left;  CDE=19.01  . Cataract extraction w/phaco  07/09/2012    Procedure: CATARACT EXTRACTION PHACO AND INTRAOCULAR LENS PLACEMENT (IOC);  Surgeon: Elta Guadeloupe T. Gershon Crane, MD;  Location: [facility];  Service: Ophthalmology;  Laterality: Right;  CDE: 20.09   Family History  Problem Relation Age of Onset  . CAD Other   . Diabetes Other    History  Substance Use Topics  . Smoking status: Former Smoker -- 1.50 packs/day for 60 years    Types: Cigarettes  . Smokeless tobacco: Not on file  . Alcohol Use: No    Review of Systems  Constitutional: Negative for fever.  Respiratory: Positive for cough, shortness of breath and wheezing. Negative for hemoptysis.   Cardiovascular: Negative for chest pain.  Gastrointestinal: Negative for abdominal pain.  All other  systems reviewed and are negative.    Allergies  Ace inhibitors  Home Medications   Current Outpatient Rx  Name  Route  Sig  Dispense  Refill  . albuterol ([name];VENTOLIN HFA) 108 (90 BASE) MCG/ACT inhaler   Inhalation   Inhale 2 puffs into the lungs every 6 (six) hours as needed for wheezing or shortness of breath.   1 Inhaler   2   . albuterol ([name];VENTOLIN HFA) 108 (90 BASE) MCG/ACT inhaler   Inhalation   Inhale 2 puffs into the lungs every 6 (six) hours as needed for wheezing or shortness of breath.   1 Inhaler   2   . albuterol (PROVENTIL) (2.5 MG/3ML) 0.083% nebulizer solution   Nebulization   Take 2.5 mg by nebulization every 6 (six) hours as needed.           Marland Kitchen aspirin 81 MG tablet   Oral   Take 81 mg by mouth daily.           Marland Kitchen doxycycline (VIBRAMYCIN) 100 MG capsule   Oral   Take 1 capsule (100 mg total) by mouth 2 (two) times daily.   20 capsule   0   . Insulin Glargine (LANTUS) 100 UNIT/ML Solostar Pen      Start with 15 units each night. Increase by one one unit each night, as directed. Up to 40 units each night.  15 mL   11   . losartan (COZAAR) 100 MG tablet      TAKE ONE TABLET BY MOUTH ONCE DAILY   90 tablet   0   . metFORMIN (GLUCOPHAGE) 1000 MG tablet      TAKE ONE TABLET BY MOUTH TWICE DAILY   180 tablet   1   . Omega-3 Fatty Acids (FISH OIL) 1000 MG CAPS   Oral   Take 2 capsules by mouth daily.         . pravastatin (PRAVACHOL) 80 MG tablet   Oral   Take 1 tablet (80 mg total) by mouth at bedtime.   90 tablet   1   . predniSONE (DELTASONE) 50 MG tablet      1 tablet PO daily   5 tablet   0   . predniSONE (DELTASONE) 50 MG tablet      1 tablet PO daily   5 tablet   0    BP 126/84  Pulse 67  Temp(Src) 98.4 F (36.9 C) (Oral)  Resp 24  Ht 5\' 5"  (1.651 m)  Wt 170 lb (77.111 kg)  BMI 28.29 kg/m2  SpO2 92% Physical Exam CONSTITUTIONAL: Well developed/well nourished HEAD:  Normocephalic/atraumatic EYES: EOMI/PERRL ENMT: Mucous membranes moist NECK: supple no meningeal signs SPINE:entire spine nontender CV: S1/S2 noted, no murmurs/rubs/gallops noted LUNGS: scattered wheezing bilaterally, no rales, no distress noted ABDOMEN: soft, nontender, no rebound or guarding GU:no cva tenderness NEURO: Pt is awake/alert, moves all extremitiesx4 EXTREMITIES: pulses normal, full ROM, no calf tenderness SKIN: warm, color normal PSYCH: no abnormalities of mood noted  ED Course  Procedures (including critical care time) 8:13 AM Pt recently seen for COPD exacerbation, not improving at home He had recent negative d-dimer and bnp while in the room he became tachycardic, but this started to improve even before nebs given IV fluids ordered and will reassess 8:54 AM Hr improved Will give nebs for his wheezing 11:38 AM Pt still with wheeze, will give hour long neb and if no improvement will admit  Pt observed for several hours He felt at baseline He reports after ambulation he felt well He denies increased SOB and no CP He reports he is now able to cough up yellow sputum He is already on antibiotics and prednisone He would like to go home His glucose was elevated but no signs of DKA He was given insulin/fluid here.  He was instructed to continue his home meds for diabetes and to monitor glucose frequently.  I spoke to his PCP by phone (mary beth dixon) and will try to have him evaluated next week.  He did have short episode of afib on EKG that resolved to sinus rhythm without meds.  Will not treat for now but needs close f/u for this and his PCP is aware.  He is already on daily ASA for this.   His pulse ox on RA was around 94% on repeat evaluation  Labs Review Labs Reviewed  CBC WITH DIFFERENTIAL - Abnormal; Notable for the following:    WBC 15.3 (*)    Neutrophils Relative % 86 (*)    Neutro Abs 13.2 (*)    Lymphocytes Relative 7 (*)    All other components  within normal limits  BASIC METABOLIC PANEL - Abnormal; Notable for the following:    Sodium 132 (*)    Chloride 92 (*)    Glucose, Bld 410 (*)    BUN 28 (*)    GFR calc non  Af Amer 68 (*)    GFR calc Af Amer 79 (*)    All other components within normal limits   Imaging Review Dg Chest 2 View  11/06/2013   CLINICAL DATA:  Shortness of breath and cough  EXAM: CHEST  2 VIEW  COMPARISON:  November 04, 2013  FINDINGS: The heart size and mediastinal contours are within normal limits. There is no focal infiltrate pulmonary edema or pleural effusion. Bilateral symmetrical nipple shadows are identified. The visualized skeletal structures are stable.  IMPRESSION: No active cardiopulmonary disease.   Electronically Signed   By: Abelardo Diesel M.D.   On: 11/06/2013 07:12    EKG Interpretation    Date/Time:  Thursday November 06 2013 07:19:35 EST Ventricular Rate:  177 PR Interval:    QRS Duration: 66 QT Interval:  250 QTC Calculation: 429 R Axis:   63 Text Interpretation:  Atrial fibrillation with rapid ventricular response Nonspecific ST abnormality Abnormal ECG When compared with ECG of 04-Nov-2013 06:58, Atrial fibrillation has replaced Sinus rhythm Vent. rate has increased BY  74 BPM ST now depressed in Inferior leads Confirmed by Christy Gentles  MD, [place] [phone]) on 11/06/2013 7:28:21 AM             Date: 11/06/2013 - repeat EKG  Rate: 128  Rhythm: sinus tachycardia  QRS Axis: normal  Intervals: normal  ST/T Wave abnormalities: nonspecific ST changes  Conduction Disutrbances:none  Narrative Interpretation:   Old EKG Reviewed: changes noted and rate is slower, now in sinus rhythm    MDM  No diagnosis found. Nursing notes including past medical history and social history reviewed and considered in documentation xrays reviewed and considered Labs/vital reviewed and considered Previous records reviewed and considered     Sharyon Cable, MD 11/06/13 [phone]

## 2013-11-06 NOTE — ED Notes (Signed)
CBG 444. 

## 2013-11-10 ENCOUNTER — Encounter: Payer: Self-pay | Admitting: Physician Assistant

## 2013-11-10 ENCOUNTER — Ambulatory Visit (INDEPENDENT_AMBULATORY_CARE_PROVIDER_SITE_OTHER): Payer: Medicare Other | Admitting: Physician Assistant

## 2013-11-10 VITALS — BP 164/86 | HR 100 | Temp 97.5°F | Resp 22 | Wt 172.0 lb

## 2013-11-10 DIAGNOSIS — J449 Chronic obstructive pulmonary disease, unspecified: Secondary | ICD-10-CM

## 2013-11-10 DIAGNOSIS — I4891 Unspecified atrial fibrillation: Secondary | ICD-10-CM

## 2013-11-10 DIAGNOSIS — I480 Paroxysmal atrial fibrillation: Secondary | ICD-10-CM

## 2013-11-10 DIAGNOSIS — B370 Candidal stomatitis: Secondary | ICD-10-CM

## 2013-11-10 DIAGNOSIS — E119 Type 2 diabetes mellitus without complications: Secondary | ICD-10-CM

## 2013-11-10 MED ORDER — NYSTATIN 100000 UNIT/ML MT SUSP: 5.0000 mL | Freq: Four times a day (QID) | OROMUCOSAL | Status: DC

## 2013-11-10 NOTE — Progress Notes (Signed)
Patient ID: CLIDE BRAINERD MRN: [id number], DOB: 30-Apr-1935, 79 y.o. Date of Encounter: @DATE @  Chief Complaint:  Chief Complaint  Patient presents with  . hospital follow up    COPD    HPI: 79 y.o. year old male  presents for follow up of 2 recent ER visits. As well, his last office visit with me was 10/30/13. At that time he was advised to schedule follow up visit with me in one week b/c we started Lantus new at that [facility].   His last office visit with me on 10/30/13 was regarding his diabetes. At that time he was to stop taking the following oral medications:   Glimepiride/Amaryl Pioglitazone/actos Januvia Today he does report that he did stop all of these medications. He is taking the metformin still as directed. He is using the Lantus every night --- has stayed at 18 units each night. He did not bring in blood sugar log sheet with him today. However he states that yesterday morning fasting blood sugar reading was 140. This morning fasting reading was 89.  He has had 2 ER visits. The first of these was on 11/04/13 and the second one was on 11/06/13.  On 11/04/13 the ER note reports that he reported a three-day history of worsening cough and shortness of breath. He had been using his albuterol nebulizer all night long at home. He denied any chest pain or fever. He had a cough productive of brown phlegm. He also reported some sore throat congestion and runny nose as well.  Physical exam at that time was notable for scattered inspiratory and expiratory wheezing.  Troponin was negative. D-dimer was negative. BNP was normal.  Chest x-ray showed COPD/chronic bronchitis. No acute superimposed process.  EKG showed sinus tachycardia. Otherwise normal EKG. He received a total of 3 Duonebs and steroids in the ER. He was improved and wanted to go home. He was discharged  with a prescription for prednisone 50 mg daily x5 days  and prescription for doxycycline 100 mg 1 by mouth twice a day x10 days.  He has  completed all of the prednisone. He does have his bottles with him today. He does have a few pills remaining of the doxycycline.  He went back to the ER again on 11/06/13. He was complaining of shortness of breath. While in the ER he did have a short episode of Paroxysmal Atrial Fibrillation that resolved to sinus rhythm without medication. The ER physician decided that they did not need to start any new treatment for the A. fib at that time but did want me to follow this up. He noted that regarding anticoagulation he was currently on daily aspirin and plan to continue daily aspirin for at that point. It was felt that the PAF was probably secondary to the COPD exacerbation.  As well at that time the ER provider did note that his blood sugar was elevated secondary to the prednisone that had been prescribed from his recent ER visit of 11/04/2013.  The ER provider did call me on the telephone as well as send me email messages. He felt that the patient was stable to be discharged home with outpatient follow up with me regarding followup of his PAF and blood sugar.  Today patient is here with his wife. He reports that he still feels like he has a difficult time breathing. Feels like there is congestion in his throat that he cannot get out. Says that he has continued to use his  albuterol nebulizer very frequently even yesterday. Says that he is unable to get any phlegm out of his chest.    Past Medical History  Diagnosis Date  . Diabetes mellitus   . COPD (chronic obstructive pulmonary disease)   . Hypertension   . Allergy     Rhinitis  . Elevated lipids   . Pulmonary fibrosis   . Bronchitis   . Colon polyps   . Vitamin D deficiency   . PSA elevation      Home Meds: See attached medication section for current medication list. Any medications entered into computer today will not appear on this note's list. The medications listed below were entered prior to today. Current Outpatient Prescriptions on  File Prior to Visit  Medication Sig Dispense Refill  . albuterol ([name];VENTOLIN HFA) 108 (90 BASE) MCG/ACT inhaler Inhale 2 puffs into the lungs every 6 (six) hours as needed for wheezing or shortness of breath.  1 Inhaler  2  . albuterol (PROVENTIL) (2.5 MG/3ML) 0.083% nebulizer solution Take 2.5 mg by nebulization every 6 (six) hours as needed for wheezing or shortness of breath.       Marland Kitchen aspirin EC 81 MG tablet Take 81 mg by mouth daily.      Marland Kitchen doxycycline (VIBRAMYCIN) 100 MG capsule Take 1 capsule (100 mg total) by mouth 2 (two) times daily.  20 capsule  0  . Insulin Glargine (LANTUS) 100 UNIT/ML Solostar Pen Start with 15 units each night. Increase by one one unit each night, as directed. Up to 40 units each night.  15 mL  11  . losartan (COZAAR) 100 MG tablet TAKE ONE TABLET BY MOUTH ONCE DAILY  90 tablet  0  . metFORMIN (GLUCOPHAGE) 1000 MG tablet TAKE ONE TABLET BY MOUTH TWICE DAILY  180 tablet  1  . Omega-3 Fatty Acids (FISH OIL) 1000 MG CAPS Take 2 capsules by mouth daily.      . pravastatin (PRAVACHOL) 80 MG tablet Take 1 tablet (80 mg total) by mouth at bedtime.  90 tablet  1  . predniSONE (DELTASONE) 50 MG tablet Take 50 mg by mouth daily with breakfast.       No current facility-administered medications on file prior to visit.    Allergies:  Allergies  Allergen Reactions  . Ace Inhibitors     Hyperkalemia--07/23/2013    History   Social History  . Marital Status: Married    Spouse Name: N/A    Number of Children: N/A  . Years of Education: N/A   Occupational History  . Not on file.   Social History Main Topics  . Smoking status: Former Smoker -- 1.50 packs/day for 60 years    Types: Cigarettes  . Smokeless tobacco: Not on file  . Alcohol Use: No  . Drug Use: No  . Sexual Activity: Yes    Birth Control/ Protection: None   Other Topics Concern  . Not on file   Social History Narrative  . No narrative on file    Family History  Problem Relation  Age of Onset  . CAD Other   . Diabetes Other      Review of Systems:  See HPI for pertinent ROS. All other ROS negative.    Physical Exam: Blood pressure 164/86, pulse 100, temperature 97.5 F (36.4 C), temperature source Oral, resp. rate 22, weight 172 lb (78.019 kg), SpO2 93.00%., Body mass index is 28.62 kg/(m^2). General: WNWD WM. Appears in no acute distress. Head: Normocephalic, atraumatic,  eyes without discharge, sclera non-icteric, nares are without discharge. Bilateral auditory canals clear, TM's are without perforation, pearly grey and translucent with reflective cone of light bilaterally. Oral cavity moist, posterior pharynx without exudate, erythema, peritonsillar abscess. Tongue does have a coating on it consistent with thrush.  Neck: Supple. No thyromegaly. No lymphadenopathy. Lungs: Distant/decreased breath sounds throughout but lungs are clear. I hear no wheeze rhonchi or rales. Heart: RRR with S1 S2. No murmurs, rubs, or gallops. Musculoskeletal:  Strength and tone normal for age. Extremities/Skin: Warm and dry. Neuro: Alert and oriented X 3. Moves all extremities spontaneously. Gait is normal. CNII-XII grossly in tact. Psych:  Responds to questions appropriately with a normal affect.   EKG shows normal sinus rhythm. Nonspecific ST-T change.  ASSESSMENT AND PLAN:  79 y.o. year old male with  1. COPD (chronic obstructive pulmonary disease) He does sound somewhat congested when I just stand and listen to him breathe. However, his lungs are clear on exam and his chest x-ray showed no acute findings. BNP was normal also so no indication/sign of CHF. I think a lot of his congestion is secondary to mucous and phlegm in his throat. As well and some of the symptoms are secondary to thrush. I told him to stop using the albuterol frequently. Add the nystatin to swish and swallow 4 times a day after meals and bedtime. Complete the doxycycline. Call me if the breathing and  symptoms worsen. Otherwise we'll plan followup in one week.   2. Diabetes He is to continue the Lantus at his current dose of 15 units every night. He is to continue to check fasting morning blood sugars every morning. He is to document this on the blood sugar log sheet which I have provided today. He is to bring his blood sugar log sheet with him to his followup appointment in one week.  3. Paroxysmal atrial fibrillation Discussed with patient and his wife that the episode of PAF was probably secondary to his COPD exacerbation. However, discussed having followup evaluation of this and they are agreeable to follow up with cardiology. She has never seen a cardiologist but his wife has seen Dr. Peter Martinique in the past they request to see him. - Ambulatory referral to Cardiology - EKG 12-Lead  4. Thrush - nystatin (MYCOSTATIN) 100000 UNIT/ML suspension; Take 5 mLs (500,000 Units total) by mouth 4 (four) times daily.  Dispense: 60 mL; Refill: 0   Signed, [address] [place], [place], [facility] 11/10/2013 2:35 PM

## 2013-11-14 ENCOUNTER — Telehealth: Payer: Self-pay | Admitting: *Deleted

## 2013-11-14 MED ORDER — ALBUTEROL SULFATE HFA 108 (90 BASE) MCG/ACT IN AERS: 2.0000 | INHALATION_SPRAY | Freq: Four times a day (QID) | RESPIRATORY_TRACT | Status: DC | PRN

## 2013-11-14 NOTE — Telephone Encounter (Signed)
Pt calling to report shortness of breath seems to be worsening.  Out of his albuterol inhaler.  Has appt here Monday.  Did send refill for his inhaler to pharmacy, to use 2 puffs Q6hrs as needed.  Also told patient if shortness of breath is severe he need to go to the UC or ED over the week end and not wait for follow up appt on Monday.

## 2013-11-15 ENCOUNTER — Encounter (HOSPITAL_COMMUNITY): Payer: Self-pay | Admitting: Emergency Medicine

## 2013-11-15 ENCOUNTER — Inpatient Hospital Stay (HOSPITAL_COMMUNITY)
Admission: EM | Admit: 2013-11-15 | Discharge: 2013-11-17 | DRG: 190 | Disposition: A | Discharge status: Home Health | Payer: Medicare Other | Attending: Internal Medicine | Admitting: Internal Medicine

## 2013-11-15 ENCOUNTER — Emergency Department (HOSPITAL_COMMUNITY): Payer: Medicare Other

## 2013-11-15 DIAGNOSIS — I480 Paroxysmal atrial fibrillation: Secondary | ICD-10-CM

## 2013-11-15 DIAGNOSIS — I10 Essential (primary) hypertension: Secondary | ICD-10-CM | POA: Diagnosis present

## 2013-11-15 DIAGNOSIS — T7840XA Allergy, unspecified, initial encounter: Secondary | ICD-10-CM

## 2013-11-15 DIAGNOSIS — J40 Bronchitis, not specified as acute or chronic: Secondary | ICD-10-CM

## 2013-11-15 DIAGNOSIS — J449 Chronic obstructive pulmonary disease, unspecified: Secondary | ICD-10-CM

## 2013-11-15 DIAGNOSIS — Z87891 Personal history of nicotine dependence: Secondary | ICD-10-CM

## 2013-11-15 DIAGNOSIS — E559 Vitamin D deficiency, unspecified: Secondary | ICD-10-CM | POA: Diagnosis present

## 2013-11-15 DIAGNOSIS — J9600 Acute respiratory failure, unspecified whether with hypoxia or hypercapnia: Secondary | ICD-10-CM

## 2013-11-15 DIAGNOSIS — J441 Chronic obstructive pulmonary disease with (acute) exacerbation: Secondary | ICD-10-CM | POA: Diagnosis present

## 2013-11-15 DIAGNOSIS — IMO0002 Reserved for concepts with insufficient information to code with codable children: Secondary | ICD-10-CM | POA: Diagnosis present

## 2013-11-15 DIAGNOSIS — T380X5A Adverse effect of glucocorticoids and synthetic analogues, initial encounter: Secondary | ICD-10-CM | POA: Diagnosis present

## 2013-11-15 DIAGNOSIS — I509 Heart failure, unspecified: Secondary | ICD-10-CM | POA: Diagnosis present

## 2013-11-15 DIAGNOSIS — E785 Hyperlipidemia, unspecified: Secondary | ICD-10-CM

## 2013-11-15 DIAGNOSIS — K635 Polyp of colon: Secondary | ICD-10-CM

## 2013-11-15 DIAGNOSIS — Z833 Family history of diabetes mellitus: Secondary | ICD-10-CM

## 2013-11-15 DIAGNOSIS — Z8249 Family history of ischemic heart disease and other diseases of the circulatory system: Secondary | ICD-10-CM

## 2013-11-15 DIAGNOSIS — E871 Hypo-osmolality and hyponatremia: Secondary | ICD-10-CM | POA: Diagnosis present

## 2013-11-15 DIAGNOSIS — E119 Type 2 diabetes mellitus without complications: Secondary | ICD-10-CM | POA: Diagnosis present

## 2013-11-15 DIAGNOSIS — J8410 Pulmonary fibrosis, unspecified: Secondary | ICD-10-CM

## 2013-11-15 DIAGNOSIS — R972 Elevated prostate specific antigen [PSA]: Secondary | ICD-10-CM

## 2013-11-15 DIAGNOSIS — J9621 Acute and chronic respiratory failure with hypoxia: Secondary | ICD-10-CM | POA: Diagnosis present

## 2013-11-15 DIAGNOSIS — R079 Chest pain, unspecified: Secondary | ICD-10-CM

## 2013-11-15 DIAGNOSIS — I5189 Other ill-defined heart diseases: Secondary | ICD-10-CM

## 2013-11-15 DIAGNOSIS — J9620 Acute and chronic respiratory failure, unspecified whether with hypoxia or hypercapnia: Secondary | ICD-10-CM | POA: Diagnosis present

## 2013-11-15 DIAGNOSIS — Z794 Long term (current) use of insulin: Secondary | ICD-10-CM

## 2013-11-15 DIAGNOSIS — E1165 Type 2 diabetes mellitus with hyperglycemia: Secondary | ICD-10-CM | POA: Diagnosis present

## 2013-11-15 DIAGNOSIS — Z7982 Long term (current) use of aspirin: Secondary | ICD-10-CM

## 2013-11-15 HISTORY — DX: Chronic respiratory failure, unspecified whether with hypoxia or hypercapnia: J96.10

## 2013-11-15 LAB — BLOOD GAS, ARTERIAL
ACID-BASE EXCESS: 2.3 mmol/L — AB (ref 0.0–2.0)
Bicarbonate: 28 mEq/L — ABNORMAL HIGH (ref 20.0–24.0)
O2 CONTENT: 4 L/min
O2 SAT: 98.5 %
PATIENT TEMPERATURE: 37
TCO2: 25.3 mmol/L (ref 0–100)
pCO2 arterial: 56.9 mmHg — ABNORMAL HIGH (ref 35.0–45.0)
pH, Arterial: 7.312 — ABNORMAL LOW (ref 7.350–7.450)
pO2, Arterial: 142 mmHg — ABNORMAL HIGH (ref 80.0–100.0)

## 2013-11-15 LAB — CBC WITH DIFFERENTIAL/PLATELET
BASOS ABS: 0 10*3/uL (ref 0.0–0.1)
BASOS PCT: 0 % (ref 0–1)
EOS ABS: 0.6 10*3/uL (ref 0.0–0.7)
EOS PCT: 6 % — AB (ref 0–5)
HCT: 40.6 % (ref 39.0–52.0)
Hemoglobin: 13.8 g/dL (ref 13.0–17.0)
Lymphocytes Relative: 21 % (ref 12–46)
Lymphs Abs: 1.9 10*3/uL (ref 0.7–4.0)
MCH: 30 pg (ref 26.0–34.0)
MCHC: 34 g/dL (ref 30.0–36.0)
MCV: 88.3 fL (ref 78.0–100.0)
Monocytes Absolute: 0.9 10*3/uL (ref 0.1–1.0)
Monocytes Relative: 10 % (ref 3–12)
Neutro Abs: 5.9 10*3/uL (ref 1.7–7.7)
Neutrophils Relative %: 63 % (ref 43–77)
PLATELETS: 295 10*3/uL (ref 150–400)
RBC: 4.6 MIL/uL (ref 4.22–5.81)
RDW: 13.6 % (ref 11.5–15.5)
WBC: 9.4 10*3/uL (ref 4.0–10.5)

## 2013-11-15 LAB — BASIC METABOLIC PANEL
BUN: 10 mg/dL (ref 6–23)
CALCIUM: 8.8 mg/dL (ref 8.4–10.5)
CO2: 29 mEq/L (ref 19–32)
Chloride: 90 mEq/L — ABNORMAL LOW (ref 96–112)
Creatinine, Ser: 0.79 mg/dL (ref 0.50–1.35)
GFR, EST NON AFRICAN AMERICAN: 85 mL/min — AB (ref >90)
Glucose, Bld: 369 mg/dL — ABNORMAL HIGH (ref 70–99)
POTASSIUM: 4.3 meq/L (ref 3.7–5.3)
SODIUM: 128 meq/L — AB (ref 137–147)

## 2013-11-15 LAB — PRO B NATRIURETIC PEPTIDE: Pro B Natriuretic peptide (BNP): 144.9 pg/mL (ref 0–450)

## 2013-11-15 LAB — TROPONIN I

## 2013-11-15 MED ORDER — AZITHROMYCIN 500 MG IV SOLR
500.0000 mg | Freq: Once | INTRAVENOUS | Status: AC
  Administered 2013-11-15: 500 mg via INTRAVENOUS

## 2013-11-15 MED ORDER — METHYLPREDNISOLONE SODIUM SUCC 125 MG IJ SOLR
125.0000 mg | Freq: Once | INTRAMUSCULAR | Status: AC
  Administered 2013-11-15: 125 mg via INTRAVENOUS
  Filled 2013-11-15: qty 2

## 2013-11-15 MED ORDER — IPRATROPIUM-ALBUTEROL 0.5-2.5 (3) MG/3ML IN SOLN
3.0000 mL | Freq: Once | RESPIRATORY_TRACT | Status: AC
  Administered 2013-11-15: 3 mL via RESPIRATORY_TRACT
  Filled 2013-11-15: qty 3

## 2013-11-15 MED ORDER — ALBUTEROL (5 MG/ML) CONTINUOUS INHALATION SOLN
20.0000 mg/h | INHALATION_SOLUTION | RESPIRATORY_TRACT | Status: DC
  Administered 2013-11-15: 20 mg/h via RESPIRATORY_TRACT
  Filled 2013-11-15: qty 20

## 2013-11-15 NOTE — ED Notes (Addendum)
Pt. Reports shortness of breath starting tonight. Cough present. Pt. Ambulated from lobby to room.

## 2013-11-15 NOTE — H&P (Signed)
PCP:   Karis Juba, PA-C   Chief Complaint:  Cough and shortness of breath  HPI:  79 year old male who  has a past medical history of Diabetes mellitus; COPD (chronic obstructive pulmonary disease); Hypertension; Allergy; Elevated lipids; Pulmonary fibrosis; Bronchitis; Colon polyps; Vitamin D deficiency; and PSA elevation. Today presented to the ED with chief complaint of shortness of breath going on for past one week and getting worse today waking him from sleep tonight. Is also having tightness in the lower neck, has thick phlegm unable to cough up. Patient has history of COPD, he is not on home oxygen, saw his PCP on Monday who gave some medications which did not improve the symptoms. Patient denies smoking cigarettes, he tried using nebulizer at home without much benefit. In the ED patient required 20 albuterol nebulizer treatments and has improved somewhat, though continues to be hypoxic on exertion. O2 sats fell to 83% on room air. Chest x-ray shows mild CHF versus atypical infection. BNP is minimally elevated to 144.9. EKG shows nonspecific ST-T changes. He denies nausea vomiting or diarrhea, no chest pain. Denies fever no dysuria urgency or frequency of urination.  Allergies:   Allergies  Allergen Reactions  . Ace Inhibitors     Hyperkalemia--07/23/2013      Past Medical History  Diagnosis Date  . Diabetes mellitus   . COPD (chronic obstructive pulmonary disease)   . Hypertension   . Allergy     Rhinitis  . Elevated lipids   . Pulmonary fibrosis   . Bronchitis   . Colon polyps   . Vitamin D deficiency   . PSA elevation     Past Surgical History  Procedure Laterality Date  . Cataract extraction w/phaco  06/25/2012    Procedure: CATARACT EXTRACTION PHACO AND INTRAOCULAR LENS PLACEMENT (IOC);  Surgeon: Elta Guadeloupe T. Gershon Crane, MD;  Location: [facility];  Service: Ophthalmology;  Laterality: Left;  CDE=19.01  . Cataract extraction w/phaco  07/09/2012    Procedure: CATARACT  EXTRACTION PHACO AND INTRAOCULAR LENS PLACEMENT (IOC);  Surgeon: Elta Guadeloupe T. Gershon Crane, MD;  Location: [facility];  Service: Ophthalmology;  Laterality: Right;  CDE: 20.09    Prior to Admission medications   Medication Sig Start Date End Date Taking? Authorizing Provider  albuterol ([name];VENTOLIN HFA) 108 (90 BASE) MCG/ACT inhaler Inhale 2 puffs into the lungs every 6 (six) hours as needed for wheezing or shortness of breath. 11/14/13  Yes Mary B Dixon, PA-C  albuterol (PROVENTIL) (2.5 MG/3ML) 0.083% nebulizer solution Take 2.5 mg by nebulization every 6 (six) hours as needed for wheezing or shortness of breath.    Yes Historical Provider, MD  aspirin EC 81 MG tablet Take 81 mg by mouth daily.   Yes Historical Provider, MD  Insulin Glargine (LANTUS) 100 UNIT/ML Solostar Pen Start with 15 units each night. Increase by one one unit each night, as directed. Up to 40 units each night. 10/30/13  Yes Orlena Sheldon, PA-C  losartan (COZAAR) 100 MG tablet Take 100 mg by mouth daily.   Yes Historical Provider, MD  metFORMIN (GLUCOPHAGE) 1000 MG tablet Take 1,000 mg by mouth 2 (two) times daily.   Yes Historical Provider, MD  nystatin (MYCOSTATIN) 100000 UNIT/ML suspension Take 5 mLs (500,000 Units total) by mouth 4 (four) times daily. 11/10/13  Yes Orlena Sheldon, PA-C  Omega-3 Fatty Acids (FISH OIL) 1000 MG CAPS Take 2 capsules by mouth daily.   Yes Historical Provider, MD  pravastatin (PRAVACHOL) 80 MG tablet Take 1  tablet (80 mg total) by mouth at bedtime. 07/23/13  Yes Orlena Sheldon, PA-C    Social History:  reports that he has quit smoking. His smoking use included Cigarettes. He has a 90 pack-year smoking history. He does not have any smokeless tobacco history on file. He reports that he does not drink alcohol or use illicit drugs.  Family History  Problem Relation Age of Onset  . CAD Other   . Diabetes Other      All the positives are listed in BOLD  Review of Systems:  HEENT: Headache, blurred  vision, runny nose, sore throat Neck: Hypothyroidism, hyperthyroidism,,lymphadenopathy Chest : Shortness of breath, history of COPD, Asthma Heart : Chest pain, history of coronary arterey disease GI:  Nausea, vomiting, diarrhea, constipation, GERD GU: Dysuria, urgency, frequency of urination, hematuria Neuro: Stroke, seizures, syncope Psych: Depression, anxiety, hallucinations   Physical Exam: Blood pressure 164/92, pulse 115, resp. rate 36, weight 77.111 kg (170 lb), SpO2 94.00%. Constitutional:   Patient is a well-developed and well-nourished *male in no acute distress and cooperative with exam. Head: Normocephalic and atraumatic Mouth: Mucus membranes moist Eyes: PERRL, EOMI, conjunctivae normal Neck: Supple, No Thyromegaly Cardiovascular: RRR, S1 normal, S2 normal Pulmonary/Chest: Bilateral rhonchi Abdominal: Soft. Non-tender, non-distended, bowel sounds are normal, no masses, organomegaly, or guarding present.  Neurological: A&O x3, Strenght is normal and symmetric bilaterally, cranial nerve II-XII are grossly intact, no focal motor deficit, sensory intact to light touch bilaterally.  Extremities : No Cyanosis, Clubbing or Edema   Labs on Admission:  Results for orders placed during the hospital encounter of 11/15/13 (from the past 48 hour(s))  CBC WITH DIFFERENTIAL     Status: Abnormal   Collection Time    11/15/13  9:40 PM      Result Value Ref Range   WBC 9.4  4.0 - 10.5 K/uL   RBC 4.60  4.22 - 5.81 MIL/uL   Hemoglobin 13.8  13.0 - 17.0 g/dL   HCT 40.6  39.0 - 52.0 %   MCV 88.3  78.0 - 100.0 fL   MCH 30.0  26.0 - 34.0 pg   MCHC 34.0  30.0 - 36.0 g/dL   RDW 13.6  11.5 - 15.5 %   Platelets 295  150 - 400 K/uL   Neutrophils Relative % 63  43 - 77 %   Neutro Abs 5.9  1.7 - 7.7 K/uL   Lymphocytes Relative 21  12 - 46 %   Lymphs Abs 1.9  0.7 - 4.0 K/uL   Monocytes Relative 10  3 - 12 %   Monocytes Absolute 0.9  0.1 - 1.0 K/uL   Eosinophils Relative 6 (*) 0 - 5 %    Eosinophils Absolute 0.6  0.0 - 0.7 K/uL   Basophils Relative 0  0 - 1 %   Basophils Absolute 0.0  0.0 - 0.1 K/uL  BASIC METABOLIC PANEL     Status: Abnormal   Collection Time    11/15/13  9:40 PM      Result Value Ref Range   Sodium 128 (*) 137 - 147 mEq/L   Potassium 4.3  3.7 - 5.3 mEq/L   Chloride 90 (*) 96 - 112 mEq/L   CO2 29  19 - 32 mEq/L   Glucose, Bld 369 (*) 70 - 99 mg/dL   BUN 10  6 - 23 mg/dL   Creatinine, Ser 0.79  0.50 - 1.35 mg/dL   Calcium 8.8  8.4 - 10.5 mg/dL   GFR  calc non Af Amer 85 (*) >90 mL/min   GFR calc Af Amer >90  >90 mL/min   Comment: (NOTE)     The eGFR has been calculated using the CKD EPI equation.     This calculation has not been validated in all clinical situations.     eGFR's persistently <90 mL/min signify possible Chronic Kidney     Disease.  TROPONIN I     Status: None   Collection Time    11/15/13  9:40 PM      Result Value Ref Range   Troponin I <0.30  <0.30 ng/mL   Comment:            Due to the release kinetics of cTnI,     a negative result within the first hours     of the onset of symptoms does not rule out     myocardial infarction with certainty.     If myocardial infarction is still suspected,     repeat the test at appropriate intervals.  PRO B NATRIURETIC PEPTIDE     Status: None   Collection Time    11/15/13  9:40 PM      Result Value Ref Range   Pro B Natriuretic peptide (BNP) 144.9  0 - 450 pg/mL  BLOOD GAS, ARTERIAL     Status: Abnormal   Collection Time    11/15/13  9:45 PM      Result Value Ref Range   O2 Content 4.0     Delivery systems NASAL CANNULA     pH, Arterial 7.312 (*) 7.350 - 7.450   pCO2 arterial 56.9 (*) 35.0 - 45.0 mmHg   pO2, Arterial 142.0 (*) 80.0 - 100.0 mmHg   Bicarbonate 28.0 (*) 20.0 - 24.0 mEq/L   TCO2 25.3  0 - 100 mmol/L   Acid-Base Excess 2.3 (*) 0.0 - 2.0 mmol/L   O2 Saturation 98.5     Patient temperature 37.0     Collection site RADIAL     Drawn by COLLECTED BY RT     Sample type  ARTERIAL     Allens test (pass/fail) PASS  PASS    Radiological Exams on Admission: Dg Chest Port 1 View  11/15/2013   CLINICAL DATA:  Shortness of breath, cough  EXAM: PORTABLE CHEST - 1 VIEW  COMPARISON:  Prior chest x-ray 11/06/2013  FINDINGS: Stable cardiac and mediastinal contours. Atherosclerotic calcification in the transverse aorta. Prominent right nipple shadow. Slightly increased pulmonary vascular congestion and central airway thickening compared to recent prior exams. No pneumothorax or pleural effusion. No acute osseous abnormality  IMPRESSION: 1. Slightly increased pulmonary vascular congestion and central airway thickening compared to recent priors. Differential considerations include mild CHF and developing atypical respiratory infection. 2. Aortic atherosclerosis.   Electronically Signed   By: Jacqulynn Cadet M.D.   On: 11/15/2013 21:41    Assessment/Plan Principal Problem:   COPD exacerbation Active Problems:   Diabetes   Hypertension   Pulmonary fibrosis  Hyponatremia  COPD exacerbation We'll admit the patient in telemetry, and start Solu Medrol 60 mg every 6 hours, Levaquin, DuoNeb nebulizers every 6 hours scheduled, Mucinex DM twice a day. We'll continue oxygen.  ? CHF Chest x-ray shows mild CHF with pulmonary vascular congestion. Will give 2 does of IV Lasix and obtain 2-D echocardiogram in the morning. Patient also complained of chest tightness so we'll obtain 3 sets of cardiac enzymes. First set of troponin in the ED is negative.  Hyponatremia Patient  has mild hyponatremia, will obtain serum and urine osmolality. Lasix typically does not cause hyponatremia, will still closely follow serum sodium in the morning.  Diabetes mellitus Will hold oral hypoglycemics at this time, and start sliding scale insulin.  Hypertension Continue Cozaar. BP is stable at this time  DVT prophylaxis Lovenox  Code status: Patient is full code  Family discussion: Discussed with  wife and sons sitting at bedside.   Time Spent on Admission: 60 minutes  [facility] Pager: [phone] 11/15/2013, 11:58 PM  If 7PM-7AM, please contact night-coverage  [email or web address]  Password [email or web address]

## 2013-11-15 NOTE — ED Provider Notes (Signed)
TIME SEEN: 9:19 PM  CHIEF COMPLAINT: Shortness of breath  HPI: Patient is a 79 year old [name] with a history of COPD who is not on home oxygen, diabetes, hypertension, hyperlipidemia, pulmonary fibrosis who presents the emergency department with complaints of shortness of breath that woke him from sleep tonight. He is also having tightness in his lower neck. He reports his been emergency department twice in the past month for the similar symptoms. He states he's not on steroids currently. He states he's had a cough with is not worse from his baseline. He does not smoke any longer. His PCP is Dr. [name] in [facility]. Denies history of PE or DVT. No lower extremity swelling or pain.  ROS: See HPI Constitutional: no fever  Eyes: no drainage  ENT: no runny nose   Cardiovascular:   chest pain  Resp: SOB  GI: no vomiting GU: no dysuria Integumentary: no rash  Allergy: no hives  Musculoskeletal: no leg swelling  Neurological: no slurred speech ROS otherwise negative  PAST MEDICAL HISTORY/PAST SURGICAL HISTORY:  Past Medical History  Diagnosis Date  . Diabetes mellitus   . COPD (chronic obstructive pulmonary disease)   . Hypertension   . Allergy     Rhinitis  . Elevated lipids   . Pulmonary fibrosis   . Bronchitis   . Colon polyps   . Vitamin D deficiency   . PSA elevation     MEDICATIONS:  Prior to Admission medications   Medication Sig Start Date End Date Taking? Authorizing Provider  albuterol ([name];VENTOLIN HFA) 108 (90 BASE) MCG/ACT inhaler Inhale 2 puffs into the lungs every 6 (six) hours as needed for wheezing or shortness of breath. 11/14/13   Lonie Peak Dixon, PA-C  albuterol (PROVENTIL) (2.5 MG/3ML) 0.083% nebulizer solution Take 2.5 mg by nebulization every 6 (six) hours as needed for wheezing or shortness of breath.     Historical Provider, MD  aspirin EC 81 MG tablet Take 81 mg by mouth daily.    Historical Provider, MD  doxycycline (VIBRAMYCIN) 100 MG capsule  Take 1 capsule (100 mg total) by mouth 2 (two) times daily. 11/04/13   Ezequiel Essex, MD  Insulin Glargine (LANTUS) 100 UNIT/ML Solostar Pen Start with 15 units each night. Increase by one one unit each night, as directed. Up to 40 units each night. 10/30/13   Orlena Sheldon, PA-C  losartan (COZAAR) 100 MG tablet TAKE ONE TABLET BY MOUTH ONCE DAILY 09/27/13   Orlena Sheldon, PA-C  metFORMIN (GLUCOPHAGE) 1000 MG tablet TAKE ONE TABLET BY MOUTH TWICE DAILY 07/23/13   Orlena Sheldon, PA-C  nystatin (MYCOSTATIN) 100000 UNIT/ML suspension Take 5 mLs (500,000 Units total) by mouth 4 (four) times daily. 11/10/13   Orlena Sheldon, PA-C  Omega-3 Fatty Acids (FISH OIL) 1000 MG CAPS Take 2 capsules by mouth daily.    Historical Provider, MD  pravastatin (PRAVACHOL) 80 MG tablet Take 1 tablet (80 mg total) by mouth at bedtime. 07/23/13   Lonie Peak Dixon, PA-C  predniSONE (DELTASONE) 50 MG tablet Take 50 mg by mouth daily with breakfast.    Historical Provider, MD    ALLERGIES:  Allergies  Allergen Reactions  . Ace Inhibitors     Hyperkalemia--07/23/2013    SOCIAL HISTORY:  History  Substance Use Topics  . Smoking status: Former Smoker -- 1.50 packs/day for 60 years    Types: Cigarettes  . Smokeless tobacco: Not on file  . Alcohol Use: No    FAMILY HISTORY: Family  History  Problem Relation Age of Onset  . CAD Other   . Diabetes Other     EXAM: BP 183/93  Pulse 112  Resp 26  Wt 170 lb (77.111 kg)  SpO2 83% CONSTITUTIONAL: Alert and oriented and responds appropriately to questions. Patient appears chronically ill, older than stated age. HEAD: Normocephalic EYES: Conjunctivae clear, PERRL ENT: normal nose; no rhinorrhea; moist mucous membranes; pharynx without lesions noted NECK: Supple, no meningismus, no LAD  CARD: RRR; S1 and S2 appreciated; no murmurs, no clicks, no rubs, no gallops RESP: Normal chest excursion without splinting; patient is tachypneic, moderate respiratory distress, speaking only  short phrases, diminished almost absent breath sounds bilaterally with very minimal expiratory wheezing occasionally, oxygen saturation 82% on room air at rest ABD/GI: Normal bowel sounds; non-distended; soft, non-tender, no rebound, no guarding BACK:  The back appears normal and is non-tender to palpation, there is no CVA tenderness EXT: Normal ROM in all joints; non-tender to palpation; no edema; normal capillary refill; no cyanosis    SKIN: Normal color for age and race; warm NEURO: Moves all extremities equally PSYCH: The patient's mood and manner are appropriate. Grooming and personal hygiene are appropriate.  MEDICAL DECISION MAKING: Patient here with likely COPD exacerbation. We'll give continuous albuterol, Solu-Medrol. Will obtain ABG, labs including troponin and BMP, chest x-ray. Patient may need admission to the hospital given he now has an oxygen requirement.  ED PROGRESS: Labs show mild respiratory acidosis. His chest x-ray shows vascular congestion versus atypical infection. Will give azithromycin. He also has mild hyponatremia, sodium 128. His troponin is negative. BNP 144.9. Will continue to monitor. Patient is still on continuous breathing treatment.  10:32 PM  Pt's tachypnea has improved. His aeration has also improved but he now has diffuse expiratory wheezing. He is a proximally halfway for his continuous albuterol treatment. Will continue to closely monitor. Discussed with patient he may need admission.   11:11 PM  Aeration continues to improve but patient is still hypoxic on room air at rest. Will continue duo neb treatments. Will discuss with hospitalist for admission.  11:20 PM  Spoke with Dr. Darrick Meigs, hospitalist for admission.  CRITICAL CARE Performed by: Nyra Jabs   Total critical care time: 30 minutes  Critical care time was exclusive of separately billable procedures and treating other patients.  Critical care was necessary to treat or prevent imminent or  life-threatening deterioration.  Critical care was time spent personally by me on the following activities: development of treatment plan with patient and/or surrogate as well as nursing, discussions with consultants, evaluation of patient's response to treatment, examination of patient, obtaining history from patient or surrogate, ordering and performing treatments and interventions, ordering and review of laboratory studies, ordering and review of radiographic studies, pulse oximetry and re-evaluation of patient's condition.    EKG Interpretation    Date/Time:  Saturday November 15 2013 21:21:51 EST Ventricular Rate:  98 PR Interval:  124 QRS Duration: 68 QT Interval:  334 QTC Calculation: 426 R Axis:   54 Text Interpretation:  Normal sinus rhythm Cannot rule out Anterior infarct (cited on or before 06-Nov-2013) Abnormal ECG When compared with ECG of 06-Nov-2013 07:27, No significant change was found Confirmed by Shandrea Lusk  DO, Daryl Quiros ([id number]) on 11/15/2013 9:25:25 PM             [place], DO 11/15/13 2320

## 2013-11-15 NOTE — ED Notes (Signed)
EDP at bedside  

## 2013-11-15 NOTE — Progress Notes (Signed)
PT coughing complaining of not being able to get anything up. Cough is mild , almost continuous.

## 2013-11-16 ENCOUNTER — Encounter (HOSPITAL_COMMUNITY): Payer: Self-pay

## 2013-11-16 DIAGNOSIS — J8410 Pulmonary fibrosis, unspecified: Secondary | ICD-10-CM

## 2013-11-16 DIAGNOSIS — J441 Chronic obstructive pulmonary disease with (acute) exacerbation: Principal | ICD-10-CM

## 2013-11-16 DIAGNOSIS — I517 Cardiomegaly: Secondary | ICD-10-CM

## 2013-11-16 DIAGNOSIS — E785 Hyperlipidemia, unspecified: Secondary | ICD-10-CM

## 2013-11-16 DIAGNOSIS — I10 Essential (primary) hypertension: Secondary | ICD-10-CM

## 2013-11-16 DIAGNOSIS — J40 Bronchitis, not specified as acute or chronic: Secondary | ICD-10-CM

## 2013-11-16 DIAGNOSIS — E119 Type 2 diabetes mellitus without complications: Secondary | ICD-10-CM

## 2013-11-16 LAB — CBC
HCT: 39.5 % (ref 39.0–52.0)
Hemoglobin: 13 g/dL (ref 13.0–17.0)
MCH: 29 pg (ref 26.0–34.0)
MCHC: 32.9 g/dL (ref 30.0–36.0)
MCV: 88.2 fL (ref 78.0–100.0)
PLATELETS: 307 10*3/uL (ref 150–400)
RBC: 4.48 MIL/uL (ref 4.22–5.81)
RDW: 13.7 % (ref 11.5–15.5)
WBC: 12.2 10*3/uL — AB (ref 4.0–10.5)

## 2013-11-16 LAB — GLUCOSE, CAPILLARY
GLUCOSE-CAPILLARY: 236 mg/dL — AB (ref 70–99)
GLUCOSE-CAPILLARY: 375 mg/dL — AB (ref 70–99)
Glucose-Capillary: 370 mg/dL — ABNORMAL HIGH (ref 70–99)
Glucose-Capillary: 255 mg/dL — ABNORMAL HIGH (ref 70–99)
Glucose-Capillary: 377 mg/dL — ABNORMAL HIGH (ref 70–99)

## 2013-11-16 LAB — COMPREHENSIVE METABOLIC PANEL
ALBUMIN: 3.6 g/dL (ref 3.5–5.2)
ALK PHOS: 64 U/L (ref 39–117)
ALT: 37 U/L (ref 0–53)
AST: 16 U/L (ref 0–37)
BILIRUBIN TOTAL: 0.3 mg/dL (ref 0.3–1.2)
BUN: 11 mg/dL (ref 6–23)
CHLORIDE: 89 meq/L — AB (ref 96–112)
CO2: 28 meq/L (ref 19–32)
CREATININE: 0.85 mg/dL (ref 0.50–1.35)
Calcium: 8.8 mg/dL (ref 8.4–10.5)
GFR calc Af Amer: >90 mL/min (ref >90)
GFR, EST NON AFRICAN AMERICAN: 83 mL/min — AB (ref >90)
Glucose, Bld: 430 mg/dL — ABNORMAL HIGH (ref 70–99)
POTASSIUM: 4.8 meq/L (ref 3.7–5.3)
Sodium: 129 mEq/L — ABNORMAL LOW (ref 137–147)
Total Protein: 6.5 g/dL (ref 6.0–8.3)

## 2013-11-16 LAB — TROPONIN I: Troponin I: <0.3 ng/mL (ref <0.30)

## 2013-11-16 LAB — INFLUENZA PANEL BY PCR (TYPE A & B)
H1N1 flu by pcr: NOT DETECTED
INFLAPCR: NEGATIVE
Influenza B By PCR: NEGATIVE

## 2013-11-16 MED ORDER — TRAZODONE HCL 50 MG PO TABS
50.0000 mg | ORAL_TABLET | Freq: Every evening | ORAL | Status: DC | PRN
  Administered 2013-11-16 (×2): 50 mg via ORAL
  Filled 2013-11-16 (×2): qty 1

## 2013-11-16 MED ORDER — ONDANSETRON HCL 4 MG PO TABS: 4.0000 mg | ORAL_TABLET | Freq: Four times a day (QID) | ORAL | Status: DC | PRN

## 2013-11-16 MED ORDER — SODIUM CHLORIDE 0.9 % IJ SOLN
3.0000 mL | Freq: Two times a day (BID) | INTRAMUSCULAR | Status: DC
Start: 2013-11-16 — End: 2013-11-17
  Administered 2013-11-16: 3 mL via INTRAVENOUS

## 2013-11-16 MED ORDER — DM-GUAIFENESIN ER 30-600 MG PO TB12
1.0000 | ORAL_TABLET | Freq: Two times a day (BID) | ORAL | Status: DC
  Administered 2013-11-16 – 2013-11-17 (×4): 1 via ORAL
  Filled 2013-11-16 (×4): qty 1

## 2013-11-16 MED ORDER — SODIUM CHLORIDE 0.9 % IJ SOLN: 3.0000 mL | INTRAMUSCULAR | Status: DC | PRN

## 2013-11-16 MED ORDER — ASPIRIN EC 81 MG PO TBEC
81.0000 mg | DELAYED_RELEASE_TABLET | Freq: Every day | ORAL | Status: DC
  Administered 2013-11-16 – 2013-11-17 (×2): 81 mg via ORAL
  Filled 2013-11-16 (×2): qty 1

## 2013-11-16 MED ORDER — INSULIN GLARGINE 100 UNIT/ML ~~LOC~~ SOLN
20.0000 [IU] | Freq: Every day | SUBCUTANEOUS | Status: DC
  Administered 2013-11-16: 20 [IU] via SUBCUTANEOUS
  Filled 2013-11-16 (×2): qty 0.2

## 2013-11-16 MED ORDER — SODIUM CHLORIDE 0.9 % IV SOLN
250.0000 mL | INTRAVENOUS | Status: DC | PRN
  Administered 2013-11-16: 250 mL via INTRAVENOUS

## 2013-11-16 MED ORDER — ONDANSETRON HCL 4 MG/2ML IJ SOLN: 4.0000 mg | Freq: Four times a day (QID) | INTRAMUSCULAR | Status: DC | PRN

## 2013-11-16 MED ORDER — LOSARTAN POTASSIUM 50 MG PO TABS
100.0000 mg | ORAL_TABLET | Freq: Every day | ORAL | Status: DC
  Administered 2013-11-16 – 2013-11-17 (×2): 100 mg via ORAL
  Filled 2013-11-16 (×2): qty 2

## 2013-11-16 MED ORDER — METHYLPREDNISOLONE SODIUM SUCC 125 MG IJ SOLR
60.0000 mg | Freq: Four times a day (QID) | INTRAMUSCULAR | Status: DC
  Administered 2013-11-16 (×2): 60 mg via INTRAVENOUS
  Filled 2013-11-16 (×2): qty 2

## 2013-11-16 MED ORDER — ALBUTEROL SULFATE (2.5 MG/3ML) 0.083% IN NEBU: 2.5000 mg | INHALATION_SOLUTION | Freq: Four times a day (QID) | RESPIRATORY_TRACT | Status: DC

## 2013-11-16 MED ORDER — ZOLPIDEM TARTRATE 5 MG PO TABS: 5.0000 mg | ORAL_TABLET | Freq: Every evening | ORAL | Status: DC | PRN

## 2013-11-16 MED ORDER — HYDROCODONE-ACETAMINOPHEN 5-325 MG PO TABS: 1.0000 | ORAL_TABLET | ORAL | Status: DC | PRN

## 2013-11-16 MED ORDER — ALBUTEROL SULFATE (2.5 MG/3ML) 0.083% IN NEBU: 2.5000 mg | INHALATION_SOLUTION | RESPIRATORY_TRACT | Status: DC | PRN

## 2013-11-16 MED ORDER — LEVOFLOXACIN IN D5W 750 MG/150ML IV SOLN
750.0000 mg | Freq: Once | INTRAVENOUS | Status: AC
  Administered 2013-11-16: 750 mg via INTRAVENOUS
  Filled 2013-11-16: qty 150

## 2013-11-16 MED ORDER — LEVOFLOXACIN IN D5W 750 MG/150ML IV SOLN
INTRAVENOUS | Status: AC
  Filled 2013-11-16: qty 150

## 2013-11-16 MED ORDER — LEVOFLOXACIN IN D5W 750 MG/150ML IV SOLN
750.0000 mg | INTRAVENOUS | Status: DC
  Administered 2013-11-16: 750 mg via INTRAVENOUS
  Filled 2013-11-16 (×2): qty 150

## 2013-11-16 MED ORDER — IPRATROPIUM BROMIDE 0.02 % IN SOLN: 0.5000 mg | Freq: Four times a day (QID) | RESPIRATORY_TRACT | Status: DC

## 2013-11-16 MED ORDER — ALUM & MAG HYDROXIDE-SIMETH 200-200-20 MG/5ML PO SUSP
30.0000 mL | Freq: Once | ORAL | Status: AC
  Administered 2013-11-16: 30 mL via ORAL
  Filled 2013-11-16: qty 30

## 2013-11-16 MED ORDER — SIMVASTATIN 20 MG PO TABS
40.0000 mg | ORAL_TABLET | Freq: Every day | ORAL | Status: DC
  Administered 2013-11-16: 40 mg via ORAL
  Filled 2013-11-16: qty 2

## 2013-11-16 MED ORDER — ENOXAPARIN SODIUM 40 MG/0.4ML ~~LOC~~ SOLN
40.0000 mg | SUBCUTANEOUS | Status: DC
  Administered 2013-11-16 – 2013-11-17 (×2): 40 mg via SUBCUTANEOUS
  Filled 2013-11-16 (×2): qty 0.4

## 2013-11-16 MED ORDER — INSULIN ASPART 100 UNIT/ML ~~LOC~~ SOLN
5.0000 [IU] | Freq: Three times a day (TID) | SUBCUTANEOUS | Status: DC
  Administered 2013-11-17: 5 [IU] via SUBCUTANEOUS

## 2013-11-16 MED ORDER — INSULIN ASPART 100 UNIT/ML ~~LOC~~ SOLN
0.0000 [IU] | Freq: Three times a day (TID) | SUBCUTANEOUS | Status: DC
  Administered 2013-11-16: 9 [IU] via SUBCUTANEOUS
  Administered 2013-11-16: 5 [IU] via SUBCUTANEOUS
  Administered 2013-11-16: 9 [IU] via SUBCUTANEOUS
  Administered 2013-11-17: 7 [IU] via SUBCUTANEOUS

## 2013-11-16 MED ORDER — IPRATROPIUM-ALBUTEROL 0.5-2.5 (3) MG/3ML IN SOLN
3.0000 mL | Freq: Four times a day (QID) | RESPIRATORY_TRACT | Status: DC
  Administered 2013-11-16 – 2013-11-17 (×6): 3 mL via RESPIRATORY_TRACT
  Filled 2013-11-16 (×6): qty 3

## 2013-11-16 MED ORDER — METHYLPREDNISOLONE SODIUM SUCC 125 MG IJ SOLR
60.0000 mg | Freq: Two times a day (BID) | INTRAMUSCULAR | Status: DC
  Administered 2013-11-17: 60 mg via INTRAVENOUS
  Filled 2013-11-16: qty 2

## 2013-11-16 MED ORDER — BENZONATATE 100 MG PO CAPS
200.0000 mg | ORAL_CAPSULE | Freq: Three times a day (TID) | ORAL | Status: DC | PRN
  Administered 2013-11-16: 200 mg via ORAL
  Filled 2013-11-16: qty 2

## 2013-11-16 MED ORDER — ALBUTEROL SULFATE (2.5 MG/3ML) 0.083% IN NEBU: 2.5000 mg | INHALATION_SOLUTION | RESPIRATORY_TRACT | Status: AC | PRN

## 2013-11-16 MED ORDER — FUROSEMIDE 10 MG/ML IJ SOLN
20.0000 mg | Freq: Two times a day (BID) | INTRAMUSCULAR | Status: AC
  Administered 2013-11-16 (×2): 20 mg via INTRAVENOUS
  Filled 2013-11-16 (×2): qty 2

## 2013-11-16 MED ORDER — INSULIN GLARGINE 100 UNIT/ML ~~LOC~~ SOLN
25.0000 [IU] | Freq: Every day | SUBCUTANEOUS | Status: DC
  Administered 2013-11-16: 25 [IU] via SUBCUTANEOUS
  Filled 2013-11-16 (×2): qty 0.25

## 2013-11-16 NOTE — Progress Notes (Signed)
Utilization review completed.  

## 2013-11-16 NOTE — Progress Notes (Signed)
ANTIBIOTIC CONSULT NOTE-Preliminary  Pharmacy Consult for Levofloxacin Indication: Bronchitis  Allergies  Allergen Reactions  . Ace Inhibitors     Hyperkalemia--07/23/2013    Patient Measurements: Weight: 170 lb (77.111 kg)  Vital Signs: Temp: 98.3 F (36.8 C) (02/15 0019) Temp src: Oral (02/15 0019) BP: 147/55 mmHg (02/15 0019) Pulse Rate: 102 (02/15 0019)  Labs:  Recent Labs  11/15/13 2140  WBC 9.4  HGB 13.8  PLT 295  CREATININE 0.79    The CrCl is unknown because both a height and weight (above a minimum accepted value) are required for this calculation.  No results found for this basename: VANCOTROUGH, VANCOPEAK, VANCORANDOM, GENTTROUGH, GENTPEAK, GENTRANDOM, TOBRATROUGH, TOBRAPEAK, TOBRARND, AMIKACINPEAK, AMIKACINTROU, AMIKACIN,  in the last 72 hours   Microbiology: No results found for this or any previous visit (from the past 720 hour(s)).  Medical History: Past Medical History  Diagnosis Date  . Diabetes mellitus   . COPD (chronic obstructive pulmonary disease)   . Hypertension   . Allergy     Rhinitis  . Elevated lipids   . Pulmonary fibrosis   . Bronchitis   . Colon polyps   . Vitamin D deficiency   . PSA elevation     Medications:   Assessment: 79 yo male with PMH sig for COPD. Presented with worsening SOB x 1 week duration despite outpatient treatment. Will be given empiric antibiotics for acute bronchitis.  Goal of Therapy:  Eradication of infection   Plan:  Preliminary review of pertinent patient information completed.  Protocol will be initiated with a one-time dose of levofloxacin 750 mg IV .  Forestine Na clinical pharmacist will complete review during morning rounds to assess patient and finalize treatment regimen.  Norberto Sorenson, [facility] 11/16/2013,12:56 AM

## 2013-11-16 NOTE — Progress Notes (Signed)
  Echocardiogram 2D Echocardiogram has been performed.  [name] 11/16/2013, 11:21 AM

## 2013-11-16 NOTE — ED Notes (Signed)
Mahmoud Blazejewski - pt's son: [phone]

## 2013-11-16 NOTE — Progress Notes (Signed)
ANTIBIOTIC CONSULT NOTE  Pharmacy Consult for Levaquin Indication: bronchitis / exacerbation of COPD  Allergies  Allergen Reactions  . Ace Inhibitors     Hyperkalemia--07/23/2013    Patient Measurements: Height: 5\' 5"  (165.1 cm) Weight: 163 lb 5.8 oz (74.1 kg) IBW/kg (Calculated) : 61.5  Vital Signs: Temp: 97.7 F (36.5 C) (02/15 0421) Temp src: Oral (02/15 0421) BP: 131/99 mmHg (02/15 0421) Pulse Rate: 93 (02/15 0421) Intake/Output from previous day: 02/14 0701 - 02/15 0700 In: 150 [IV Piggyback:150] Out: 893 [Urine:875] Intake/Output from this shift:    Labs:  Recent Labs  11/15/13 2140 11/16/13 0601  WBC 9.4 12.2*  HGB 13.8 13.0  PLT 295 307  CREATININE 0.79 0.85   Estimated Creatinine Clearance: 69.5 ml/min (by C-G formula based on Cr of 0.85). No results found for this basename: VANCOTROUGH, VANCOPEAK, VANCORANDOM, GENTTROUGH, GENTPEAK, GENTRANDOM, TOBRATROUGH, TOBRAPEAK, TOBRARND, AMIKACINPEAK, AMIKACINTROU, AMIKACIN,  in the last 72 hours   Microbiology: No results found for this or any previous visit (from the past 720 hour(s)).  Medical History: Past Medical History  Diagnosis Date  . Diabetes mellitus   . COPD (chronic obstructive pulmonary disease)   . Hypertension   . Allergy     Rhinitis  . Elevated lipids   . Pulmonary fibrosis   . Bronchitis   . Colon polyps   . Vitamin D deficiency   . PSA elevation    Medications:  Prescriptions prior to admission  Medication Sig Dispense Refill  . albuterol ([name];VENTOLIN HFA) 108 (90 BASE) MCG/ACT inhaler Inhale 2 puffs into the lungs every 6 (six) hours as needed for wheezing or shortness of breath.  1 Inhaler  2  . albuterol (PROVENTIL) (2.5 MG/3ML) 0.083% nebulizer solution Take 2.5 mg by nebulization every 6 (six) hours as needed for wheezing or shortness of breath.       Marland Kitchen aspirin EC 81 MG tablet Take 81 mg by mouth daily.      . Insulin Glargine (LANTUS) 100 UNIT/ML Solostar Pen  Start with 15 units each night. Increase by one one unit each night, as directed. Up to 40 units each night.  15 mL  11  . losartan (COZAAR) 100 MG tablet Take 100 mg by mouth daily.      . metFORMIN (GLUCOPHAGE) 1000 MG tablet Take 1,000 mg by mouth 2 (two) times daily.      Marland Kitchen nystatin (MYCOSTATIN) 100000 UNIT/ML suspension Take 5 mLs (500,000 Units total) by mouth 4 (four) times daily.  60 mL  0  . Omega-3 Fatty Acids (FISH OIL) 1000 MG CAPS Take 2 capsules by mouth daily.      . pravastatin (PRAVACHOL) 80 MG tablet Take 1 tablet (80 mg total) by mouth at bedtime.  90 tablet  1   Assessment: Okay for Protocol, patient received initial dose of Levaquin early this AM.  Goal of Therapy:  Eradicate infection.   Plan: . Levaquin 750mg  IV every 24 hours. Follow up culture results  Pricilla Larsson 11/16/2013,8:14 AM

## 2013-11-16 NOTE — Progress Notes (Signed)
Patient was admitted to the hospital earlier this morning by Dr. Darrick Meigs.  Patient seen and examined.  Edward [name] is now admitted with shortness of breath due to exacerbation of COPD. Edward [name] is on IV steroids, bronchodilators and antibiotics. 2-D echocardiogram done today indicates grade 1 diastolic function with preserved ejection fraction. Chest x-ray shows questionable CHF with normal BNP. Due to the steroids, patient has become hyperglycemic. Will adjust insulin.  We'll continue with current treatments. Anticipate Edward [name] will be in the hospital one to 2 days.  Edward [name]

## 2013-11-16 NOTE — Progress Notes (Signed)
Patient having episodes of coughing.  Dr. Roderic Palau notified.  Stated will order Tessalon.

## 2013-11-17 ENCOUNTER — Encounter (HOSPITAL_COMMUNITY): Payer: Self-pay | Admitting: Internal Medicine

## 2013-11-17 ENCOUNTER — Ambulatory Visit: Payer: Medicare Other | Admitting: Physician Assistant

## 2013-11-17 DIAGNOSIS — J9621 Acute and chronic respiratory failure with hypoxia: Secondary | ICD-10-CM | POA: Diagnosis present

## 2013-11-17 DIAGNOSIS — I519 Heart disease, unspecified: Secondary | ICD-10-CM

## 2013-11-17 DIAGNOSIS — J9600 Acute respiratory failure, unspecified whether with hypoxia or hypercapnia: Secondary | ICD-10-CM

## 2013-11-17 DIAGNOSIS — I5189 Other ill-defined heart diseases: Secondary | ICD-10-CM | POA: Diagnosis present

## 2013-11-17 LAB — CBC
HCT: 39 % (ref 39.0–52.0)
Hemoglobin: 13 g/dL (ref 13.0–17.0)
MCH: 29.7 pg (ref 26.0–34.0)
MCHC: 33.3 g/dL (ref 30.0–36.0)
MCV: 89.2 fL (ref 78.0–100.0)
PLATELETS: 322 10*3/uL (ref 150–400)
RBC: 4.37 MIL/uL (ref 4.22–5.81)
RDW: 14.3 % (ref 11.5–15.5)
WBC: 19.7 10*3/uL — AB (ref 4.0–10.5)

## 2013-11-17 LAB — BASIC METABOLIC PANEL
BUN: 18 mg/dL (ref 6–23)
CHLORIDE: 93 meq/L — AB (ref 96–112)
CO2: 30 mEq/L (ref 19–32)
Calcium: 8.8 mg/dL (ref 8.4–10.5)
Creatinine, Ser: 1.02 mg/dL (ref 0.50–1.35)
GFR calc Af Amer: 80 mL/min — ABNORMAL LOW (ref >90)
GFR calc non Af Amer: 69 mL/min — ABNORMAL LOW (ref >90)
Glucose, Bld: 253 mg/dL — ABNORMAL HIGH (ref 70–99)
POTASSIUM: 4.4 meq/L (ref 3.7–5.3)
SODIUM: 134 meq/L — AB (ref 137–147)

## 2013-11-17 LAB — OSMOLALITY: Osmolality: 288 mOsm/kg (ref 275–300)

## 2013-11-17 LAB — GLUCOSE, CAPILLARY: Glucose-Capillary: 301 mg/dL — ABNORMAL HIGH (ref 70–99)

## 2013-11-17 LAB — OSMOLALITY, URINE: OSMOLALITY UR: 324 mosm/kg — AB (ref 390–1090)

## 2013-11-17 MED ORDER — PREDNISONE 10 MG PO TABS: ORAL_TABLET | ORAL | Status: DC

## 2013-11-17 MED ORDER — HYDROCODONE-ACETAMINOPHEN 5-325 MG PO TABS: 1.0000 | ORAL_TABLET | Freq: Four times a day (QID) | ORAL | Status: DC | PRN

## 2013-11-17 MED ORDER — DM-GUAIFENESIN ER 30-600 MG PO TB12: 1.0000 | ORAL_TABLET | Freq: Two times a day (BID) | ORAL | Status: DC

## 2013-11-17 MED ORDER — BENZONATATE 200 MG PO CAPS: 200.0000 mg | ORAL_CAPSULE | Freq: Three times a day (TID) | ORAL | Status: DC | PRN

## 2013-11-17 MED ORDER — INSULIN GLARGINE 100 UNIT/ML SOLOSTAR PEN: 20.0000 [IU] | PEN_INJECTOR | Freq: Every day | SUBCUTANEOUS | Status: DC

## 2013-11-17 MED ORDER — LEVOFLOXACIN 750 MG PO TABS: 750.0000 mg | ORAL_TABLET | Freq: Every day | ORAL | Status: DC

## 2013-11-17 NOTE — Discharge Summary (Signed)
Patient seen and examined.  Agree with note as above  Patient admitted with shortness of breath felt to be due to a copd exacerbation.  He was started on iv steroids, abx and bronchodilators.  He also was noted to have diastolic dysfunction and received 2 dose of lasix.  His respiratory status has returned to baseline. He is still mildly hypoxic but has normal effort.  He will be discharged home with oxygen to be re evaluated in the outpatient setting.  Edward [name]

## 2013-11-17 NOTE — Care Management Note (Signed)
    Page 1 of 2   11/17/2013     12:06:56 PM   CARE MANAGEMENT NOTE 11/17/2013  Patient:  Edward [name], Edward [name]   Account Number:  [id number]  Date Initiated:  11/17/2013  Documentation initiated by:  Claretha Cooper  Subjective/Objective Assessment:   Pt lives at home with his spouse. COPD. HH RN requested Physicist, medical. Will need O2.     Action/Plan:   PT to evaluate t   Anticipated DC Date:  11/17/2013   Anticipated DC Plan:  [place]  CM consult      PAC Choice  [place]   Choice offered to / List presented to:  C-1 Patient   DME arranged  [facility]      DME agency  [place] arranged  HH-1 RN  [place].   Status of service:  Completed, signed off Medicare Important Message given?  YES (If response is "NO", the following Medicare IM given date fields will be blank) Date Medicare IM given:  11/17/2013 Date Additional Medicare IM given:    Discharge Disposition:  [place]  Per UR Regulation:    If discussed at Long Length of Stay Meetings, dates discussed:    Comments:  11/17/13 Desirae Mancusi RN BSN CM

## 2013-11-17 NOTE — Progress Notes (Signed)
At rest on RA: 88% Ambulating on RA: 88% 2L resting: 96%

## 2013-11-17 NOTE — Progress Notes (Signed)
Patient being d/c home with prescriptions. IV cath removed and intact. No pain/swelling at site.

## 2013-11-17 NOTE — Clinical Documentation Improvement (Signed)
Please clarify respiratory status. Thank you.  Possible Clinical Conditions?  Acute Respiratory Failure Acute on Chronic Respiratory Failure Chronic Respiratory Failure Other Condition Cannot Clinically Determine   Supporting Information: Risk Factors: History of COPD, and pulmonary fibrosis Admitted with COPD exacerbation Respiratory acidosis  Signs & Symptoms: Shortness of breath BP 183/93  Pulse 112  Resp 26 SpO2 83% ED Provider: RESP: Normal chest excursion without splinting; patient is tachypneic, moderate respiratory distress, speaking only short phrases, diminished almost absent breath sounds bilaterally with very minimal expiratory wheezing occasionally, oxygen saturation 82% on room air at rest.  ....still hypoxic on room air at rest.   Diagnostics: ABG on 4L/m via [place] 2/14 = pH = 7.312; pCO2 = 56.9; pO2 = 142; HCO3 = 28.0 CXR 2/14: IMPRESSION:  1. Slightly increased pulmonary vascular congestion and central  airway thickening compared to recent priors. Differential  considerations include mild CHF and developing atypical respiratory  infection.   Treatment: Proventil  Neb q2h prn Tessalon 200mg  tid prn cough Solumedrol 60mg  IV q12h O2 ; Keep O2 sts > 92% Pulse Ox continuous  Thank You, Estella Husk ,RN Clinical Documentation Specialist:  [phone]  [place] Information Management

## 2013-11-17 NOTE — Progress Notes (Addendum)
ANTIBIOTIC CONSULT NOTE  Pharmacy Consult for Levaquin Indication: bronchitis / exacerbation of COPD  Allergies  Allergen Reactions  . Ace Inhibitors     Hyperkalemia--07/23/2013   Patient Measurements: Height: 5\' 5"  (165.1 cm) Weight: 163 lb 5.8 oz (74.1 kg) IBW/kg (Calculated) : 61.5  Vital Signs: Temp: 97.8 F (36.6 C) (02/16 0510) Temp src: Oral (02/16 0510) BP: 135/56 mmHg (02/16 0510) Pulse Rate: 85 (02/16 0510) Intake/Output from previous day: 02/15 0701 - 02/16 0700 In: 720 [P.O.:720] Out: 1650 [Urine:1650] Intake/Output from this shift:    Labs:  Recent Labs  11/15/13 2140 11/16/13 0601 11/17/13 0525  WBC 9.4 12.2* 19.7*  HGB 13.8 13.0 13.0  PLT 295 307 322  CREATININE 0.79 0.85 1.02   Estimated Creatinine Clearance: 58 ml/min (by C-G formula based on Cr of 1.02). No results found for this basename: VANCOTROUGH, VANCOPEAK, VANCORANDOM, GENTTROUGH, GENTPEAK, GENTRANDOM, TOBRATROUGH, TOBRAPEAK, TOBRARND, AMIKACINPEAK, AMIKACINTROU, AMIKACIN,  in the last 72 hours   Microbiology: No results found for this or any previous visit (from the past 720 hour(s)).  Medical History: Past Medical History  Diagnosis Date  . Diabetes mellitus   . COPD (chronic obstructive pulmonary disease)   . Hypertension   . Allergy     Rhinitis  . Elevated lipids   . Pulmonary fibrosis   . Bronchitis   . Colon polyps   . Vitamin D deficiency   . PSA elevation    Medications:  Prescriptions prior to admission  Medication Sig Dispense Refill  . albuterol ([name];VENTOLIN HFA) 108 (90 BASE) MCG/ACT inhaler Inhale 2 puffs into the lungs every 6 (six) hours as needed for wheezing or shortness of breath.  1 Inhaler  2  . albuterol (PROVENTIL) (2.5 MG/3ML) 0.083% nebulizer solution Take 2.5 mg by nebulization every 6 (six) hours as needed for wheezing or shortness of breath.       Marland Kitchen aspirin EC 81 MG tablet Take 81 mg by mouth daily.      . Insulin Glargine (LANTUS) 100  UNIT/ML Solostar Pen Start with 15 units each night. Increase by one one unit each night, as directed. Up to 40 units each night.  15 mL  11  . losartan (COZAAR) 100 MG tablet Take 100 mg by mouth daily.      . metFORMIN (GLUCOPHAGE) 1000 MG tablet Take 1,000 mg by mouth 2 (two) times daily.      Marland Kitchen nystatin (MYCOSTATIN) 100000 UNIT/ML suspension Take 5 mLs (500,000 Units total) by mouth 4 (four) times daily.  60 mL  0  . Omega-3 Fatty Acids (FISH OIL) 1000 MG CAPS Take 2 capsules by mouth daily.      . pravastatin (PRAVACHOL) 80 MG tablet Take 1 tablet (80 mg total) by mouth at bedtime.  90 tablet  1   Assessment: Day #3 of Levaquin for Bronchitis/COPD exacerbation.  Estimated Creatinine Clearance: 58 ml/min (by C-G formula based on Cr of 1.02).  Goal of Therapy:  Eradicate infection.   PHARMACIST - PHYSICIAN COMMUNICATION DR:   Roderic Palau CONCERNING: Antibiotic IV to Oral Route Change Policy  RECOMMENDATION: This patient is receiving Levaquin by the intravenous route.  Based on criteria approved by the Pharmacy and Therapeutics Committee, the antibiotic(s) is/are being converted to the equivalent oral dose form(s).  DESCRIPTION: These criteria include:  Patient being treated for a respiratory tract infection, urinary tract infection, cellulitis or clostridium difficile associated diarrhea if on metronidazole  The patient is not neutropenic and does not exhibit a GI  malabsorption state  The patient is eating (either orally or via tube) and/or has been taking other orally administered medications for a least 24 hours  The patient is improving clinically and has a Tmax < 100.5  If you have questions about this conversion, please contact the Pharmacy Department  [x]   [phone] )  [facility] []   [phone] )  [facility]  []   [phone] )  [facility] []   [phone] )  [place] 11/17/2013,10:19 AM

## 2013-11-17 NOTE — Discharge Summary (Signed)
Physician Discharge Summary  Edward [name] [id number] DOB: Feb 05, 1935 DOA: 11/15/2013  PCP: Karis Juba, PA-C  Admit date: 11/15/2013 Discharge date: 11/17/2013  Time spent: 40 minutes  Recommendations for Outpatient Follow-up:  1. Follow up with PCP 11/27/13 for evaluation of symptoms. Recommend close follow up on diastolic dysfunction and chronic respiratory failure. Recommend BMET to track sodium level as well.    Discharge Diagnoses:  Acute on chronic  respiratory failure   COPD exacerbation Diastolic dysfunction   Diabetes   Hypertension   Pulmonary fibrosis    Discharge Condition: stable  Diet recommendation: Heart Healthy  [facility] Weights   11/15/13 2116 11/16/13 0019  Weight: 77.111 kg (170 lb) 74.1 kg (163 lb 5.8 oz)    History of present illness:  79 year old male who has a past medical history of Diabetes mellitus; COPD (chronic obstructive pulmonary disease); Hypertension; Allergy; Elevated lipids; Pulmonary fibrosis; Bronchitis; Colon polyps; Vitamin D deficiency; and PSA elevation.  Presented to the ED on 11/15/13 with chief complaint of shortness of breath going on for  one week and getting worse waking him from sleep. Also devoped tightness in the lower neck, and thick phlegm unable to cough up. Patient has history of COPD, but is not on home oxygen, saw his PCP on 5 days prior who gave some medications which did not improve the symptoms. Patient denied smoking cigarettes, he tried using nebulizer at home without much benefit.  In the ED patient required 20 albuterol nebulizer treatments and improved somewhat, though continued to be hypoxic on exertion. O2 sats fell to 83% on room air. Chest x-ray showed mild CHF versus atypical infection. BNP  minimally elevated to 144.9. EKG showed nonspecific ST-T changes.  He denied nausea vomiting or diarrhea, no chest pain. Denied fever no dysuria urgency or frequency of urination.   Hospital Course:  Acute on chronic  respiratory failure related to COPD exacerbation. Pt oxygen saturation level fell in ED in spite of nebs. Admitted to floor and provided with oxygen therapy as well as nebs, solumedrol, and antibiotics. At discharge oxygen saturation level 88% at rest room air. Will be discharged on home oxygen. Recommend close OP follow up.   COPD exacerbation  Provided with  Solu Medrol 60 mg every 6 hours, Levaquin, DuoNeb nebulizers every 6 hours scheduled, Mucinex DM twice a day. Given  Oxygen support. At discharge much improved. Less wheeze and improved air flow. Will discharge with prednisone taper. Close OP follow up as above   ? CHF  Chest x-ray showed mild CHF with pulmonary vascular congestion. Given 2 does of IV Lasix and diuresed 1.6L.  2-D echocardiogram with mild LVH and grade 1 diastolic dysfunction with EF 65%. Troponin negative x3. EKG with NSR  Hyponatremia  Patient with mild hyponatremia on admission. Urine osmolality 345 and serum osmolality 288. Given IV fluids. At discharge sodium level  134 up from 129 on admission  Diabetes mellitus  Oral hypoglycemics held on admission. CBG's range 236. Likely related to steroids as well.  Will resume home regimen.    Hypertension   BP controlled during this hospitalization.  Procedures:  Echo 11/16/13. EF 65 % with mild LVH and grade 1 diastolic dysfunction  Consultations:  none  Discharge Exam: Filed Vitals:   11/17/13 0510  BP: 135/56  Pulse: 85  Temp: 97.8 F (36.6 C)  Resp: 20    General: well nourished appears comfortable Cardiovascular: RRR No MGR No LE edema Respiratory: normal effort. BS diminished but fair  air flow. Mild diffuse expiratory wheeze.   Discharge Instructions      Discharge Orders   Future Appointments Provider Department Dept Phone   11/27/2013 8:15 AM [facility] [phone]   12/19/2013 2:30 PM Peter M Martinique, MD [place] Office [phone]   01/21/2014  8:15 AM Orlena Sheldon, PA-C [place] [phone]   Future Orders Complete By Expires   Diet - low sodium heart healthy  As directed    Discharge instructions  As directed    Comments:     Take medications as directed Keep follow up appointments as scheduled If symptoms fail to continue to improve or worsen seek medical attentions   Increase activity slowly  As directed        Medication List         albuterol (2.5 MG/3ML) 0.083% nebulizer solution  Commonly known as:  PROVENTIL  Take 2.5 mg by nebulization every 6 (six) hours as needed for wheezing or shortness of breath.     albuterol 108 (90 BASE) MCG/ACT inhaler  Commonly known as:  [name];VENTOLIN HFA  Inhale 2 puffs into the lungs every 6 (six) hours as needed for wheezing or shortness of breath.     aspirin EC 81 MG tablet  Take 81 mg by mouth daily.     benzonatate 200 MG capsule  Commonly known as:  TESSALON  Take 1 capsule (200 mg total) by mouth 3 (three) times daily as needed for cough.     dextromethorphan-guaiFENesin 30-600 MG per 12 hr tablet  Commonly known as:  MUCINEX DM  Take 1 tablet by mouth 2 (two) times daily.     Fish Oil 1000 MG Caps  Take 2 capsules by mouth daily.     HYDROcodone-acetaminophen 5-325 MG per tablet  Commonly known as:  NORCO/VICODIN  Take 1 tablet by mouth every 6 (six) hours as needed for moderate pain.     Insulin Glargine 100 UNIT/ML Solostar Pen  Commonly known as:  LANTUS  Start with 15 units each night. Increase by one one unit each night, as directed. Up to 40 units each night.     levofloxacin 750 MG tablet  Commonly known as:  LEVAQUIN  Take 1 tablet (750 mg total) by mouth daily.     losartan 100 MG tablet  Commonly known as:  COZAAR  Take 100 mg by mouth daily.     metFORMIN 1000 MG tablet  Commonly known as:  GLUCOPHAGE  Take 1,000 mg by mouth 2 (two) times daily.     nystatin 100000 UNIT/ML suspension  Commonly known as:   MYCOSTATIN  Take 5 mLs (500,000 Units total) by mouth 4 (four) times daily.     pravastatin 80 MG tablet  Commonly known as:  PRAVACHOL  Take 1 tablet (80 mg total) by mouth at bedtime.     predniSONE 10 MG tablet  Commonly known as:  DELTASONE  Take 6 tabs starting 10/18/13 for 3 days then take 4 tabs for 3 days then take 2 tabs for 3 days then take 1 tab for 3 days then stop.       Allergies  Allergen Reactions  . Ace Inhibitors     Hyperkalemia--07/23/2013   Follow-up Information   Follow up with [facility] BETH, PA-C. Schedule an appointment as soon as possible for a visit on 11/27/2013. (appointment at 8:15am)    Specialty:  Physician Assistant   Contact  information:   [address] [place] [id number] [phone]        The results of significant diagnostics from this hospitalization (including imaging, microbiology, ancillary and laboratory) are listed below for reference.    Significant Diagnostic Studies: Dg Chest 2 View  11/06/2013   CLINICAL DATA:  Shortness of breath and cough  EXAM: CHEST  2 VIEW  COMPARISON:  November 04, 2013  FINDINGS: The heart size and mediastinal contours are within normal limits. There is no focal infiltrate pulmonary edema or pleural effusion. Bilateral symmetrical nipple shadows are identified. The visualized skeletal structures are stable.  IMPRESSION: No active cardiopulmonary disease.   Electronically Signed   By: Abelardo Diesel M.D.   On: 11/06/2013 07:12   Dg Chest Port 1 View  11/15/2013   CLINICAL DATA:  Shortness of breath, cough  EXAM: PORTABLE CHEST - 1 VIEW  COMPARISON:  Prior chest x-ray 11/06/2013  FINDINGS: Stable cardiac and mediastinal contours. Atherosclerotic calcification in the transverse aorta. Prominent right nipple shadow. Slightly increased pulmonary vascular congestion and central airway thickening compared to recent prior exams. No pneumothorax or pleural effusion. No acute osseous abnormality  IMPRESSION: 1.  Slightly increased pulmonary vascular congestion and central airway thickening compared to recent priors. Differential considerations include mild CHF and developing atypical respiratory infection. 2. Aortic atherosclerosis.   Electronically Signed   By: Jacqulynn Cadet M.D.   On: 11/15/2013 21:41   Dg Chest Portable 1 View  11/04/2013   CLINICAL DATA:  Shortness of breath.  Cough.  Diabetes.  COPD.  EXAM: PORTABLE CHEST - 1 VIEW  COMPARISON:  DG CHEST 2 VIEW dated 06/21/2012  FINDINGS: Apical lordotic patient positioning. Midline trachea. Normal heart size and mediastinal contours. Mild pleural thickening blunts the costophrenic angles bilaterally. No pneumothorax. Mild hyperinflation and chronic interstitial thickening. Mild bibasilar volume loss, without lobar consolidation.  IMPRESSION: COPD/chronic bronchitis. No acute superimposed process.   Electronically Signed   By: Abigail Miyamoto M.D.   On: 11/04/2013 07:27    Microbiology: No results found for this or any previous visit (from the past 240 hour(s)).   Labs: Basic Metabolic Panel:  Recent Labs Lab 11/15/13 2140 11/16/13 0601 11/17/13 0525  NA 128* 129* 134*  K 4.3 4.8 4.4  CL 90* 89* 93*  CO2 29 28 30   GLUCOSE 369* 430* 253*  BUN 10 11 18   CREATININE 0.79 0.85 1.02  CALCIUM 8.8 8.8 8.8   Liver Function Tests:  Recent Labs Lab 11/16/13 0601  AST 16  ALT 37  ALKPHOS 64  BILITOT 0.3  PROT 6.5  ALBUMIN 3.6   No results found for this basename: LIPASE, AMYLASE,  in the last 168 hours No results found for this basename: AMMONIA,  in the last 168 hours CBC:  Recent Labs Lab 11/15/13 2140 11/16/13 0601 11/17/13 0525  WBC 9.4 12.2* 19.7*  NEUTROABS 5.9  --   --   HGB 13.8 13.0 13.0  HCT 40.6 39.5 39.0  MCV 88.3 88.2 89.2  PLT 295 307 322   Cardiac Enzymes:  Recent Labs Lab 11/15/13 2140 11/16/13 0051 11/16/13 0601 11/16/13 1213  TROPONINI <0.30 <0.30 <0.30 <0.30   BNP: BNP (last 3 results)  Recent  Labs  11/04/13 0703 11/15/13 2140  PROBNP 68.9 144.9   CBG:  Recent Labs Lab 11/16/13 0726 11/16/13 1144 11/16/13 1607 11/16/13 2225 11/17/13 0749  GLUCAP 370* 255* 377* 236* 301*       Signed:  Radene Gunning  [facility] 11/17/2013, 12:48 PM

## 2013-11-17 NOTE — Evaluation (Signed)
Physical Therapy Evaluation Patient Details Name: RENO CLASBY MRN: [id number] DOB: 1935-04-26 Today's Date: 11/17/2013 Time: 3382-5053 PT Time Calculation (min): 19 min  PT Assessment / Plan / Recommendation History of Present Illness  Pt is admitted with an exacerbation of COPD and possible CHF.   He has a hx of pulmonary fibrosis  and DM, has not needed supplemental O2.  PT is consulted because pt is requesting a w/c.  Clinical Impression  Pt was seen for evaluation.  He is on 2 L O2 due to resting O2 sat= 88%.  He is able to ambulate 400' with no difficulty and normal speed.  His O2 sat =96% on 2 L O2.  He does not currently need a w/c and this was explained to the pt.  He understands and is in agreement.    PT Assessment  Patent does not need any further PT services    Follow Up Recommendations  No PT follow up    Does the patient have the potential to tolerate intense rehabilitation      Barriers to Discharge        Equipment Recommendations  None recommended by PT    Recommendations for Other Services     Frequency      Precautions / Restrictions Precautions Precautions: None Restrictions Weight Bearing Restrictions: No   Pertinent Vitals/Pain       Mobility  Bed Mobility Overal bed mobility: Independent Transfers Overall transfer level: Independent Ambulation/Gait Ambulation/Gait assistance: Independent Ambulation Distance (Feet): 400 Feet Assistive device: None Gait Pattern/deviations: WFL(Within Functional Limits) Gait velocity interpretation: at or above normal speed for age/gender    Exercises     PT Diagnosis:    PT Problem List:   PT Treatment Interventions:       PT Goals(Current goals can be found in the care plan section) Acute Rehab PT Goals PT Goal Formulation: No goals set, d/c therapy  Visit Information  Last PT Received On: 11/17/13 History of Present Illness: Pt is admitted with an exacerbation of COPD and possible CHF.   He  has a hx of pulmonary fibrosis  and DM, has not needed supplemental O2.  PT is consulted because pt is requesting a w/c.       Prior [place] expects to be discharged to:: Private residence Living Arrangements: Spouse/significant other Available Help at Discharge: Family;Available 24 hours/day Type of Home: House Home Layout: One level Home Equipment: None Prior Function Level of Independence: Independent Communication Communication: No difficulties    Cognition  Cognition Arousal/Alertness: Awake/alert Behavior During Therapy: WFL for tasks assessed/performed Overall Cognitive Status: Within Functional Limits for tasks assessed    Extremity/Trunk Assessment Lower Extremity Assessment Lower Extremity Assessment: Overall WFL for tasks assessed   Balance Balance Overall balance assessment: No apparent balance deficits (not formally assessed)  End of Session PT - End of Session Equipment Utilized During Treatment: Gait belt Activity Tolerance: Patient tolerated treatment well Patient left: in bed;with call bell/phone within reach;with bed alarm set  GP     Demetrios Isaacs L 11/17/2013, 12:11 PM

## 2013-11-27 ENCOUNTER — Inpatient Hospital Stay: Payer: Medicare Other | Admitting: Physician Assistant

## 2013-12-01 ENCOUNTER — Ambulatory Visit (INDEPENDENT_AMBULATORY_CARE_PROVIDER_SITE_OTHER): Payer: Medicare Other | Admitting: Physician Assistant

## 2013-12-01 ENCOUNTER — Encounter: Payer: Self-pay | Admitting: Physician Assistant

## 2013-12-01 VITALS — BP 126/70 | HR 88 | Temp 98.4°F | Resp 20 | Wt 161.0 lb

## 2013-12-01 DIAGNOSIS — E871 Hypo-osmolality and hyponatremia: Secondary | ICD-10-CM

## 2013-12-01 DIAGNOSIS — J441 Chronic obstructive pulmonary disease with (acute) exacerbation: Secondary | ICD-10-CM

## 2013-12-01 DIAGNOSIS — I519 Heart disease, unspecified: Secondary | ICD-10-CM

## 2013-12-01 DIAGNOSIS — I5189 Other ill-defined heart diseases: Secondary | ICD-10-CM

## 2013-12-01 DIAGNOSIS — E119 Type 2 diabetes mellitus without complications: Secondary | ICD-10-CM

## 2013-12-01 MED ORDER — TIOTROPIUM BROMIDE MONOHYDRATE 18 MCG IN CAPS: 18.0000 ug | ORAL_CAPSULE | Freq: Every day | RESPIRATORY_TRACT | Status: DC

## 2013-12-01 MED ORDER — BUDESONIDE-FORMOTEROL FUMARATE 160-4.5 MCG/ACT IN AERO: 2.0000 | INHALATION_SPRAY | Freq: Two times a day (BID) | RESPIRATORY_TRACT | Status: DC

## 2013-12-02 LAB — BASIC METABOLIC PANEL WITH GFR
BUN: 10 mg/dL (ref 6–23)
CALCIUM: 9.6 mg/dL (ref 8.4–10.5)
CO2: 31 mEq/L (ref 19–32)
CREATININE: 0.83 mg/dL (ref 0.50–1.35)
Chloride: 95 mEq/L — ABNORMAL LOW (ref 96–112)
GFR, EST NON AFRICAN AMERICAN: 85 mL/min
Glucose, Bld: 254 mg/dL — ABNORMAL HIGH (ref 70–99)
Potassium: 4.7 mEq/L (ref 3.5–5.3)
Sodium: 132 mEq/L — ABNORMAL LOW (ref 135–145)

## 2013-12-02 NOTE — Progress Notes (Signed)
Patient ID: Edward [name] MRN: [id number], DOB: Feb 13, 1935, 79 y.o. Date of Encounter: @DATE @  Chief Complaint:  Chief Complaint  Patient presents with  . hosp follow up    ?? stil has thrush  states doesn't know anything about medication that was ordered for that    HPI: 79 y.o. year old white male  presents for followup of yet another hospitalization for  COPD exacerbation. See my  last office visit note, which was a visit for  follow up of two recent ER visits just prior to that visit.  His most recent hospitalization occurred 11/15/13 through 11/17/13. Presented to ER on 11/15/13 with complaint of shortness of breath. Also thick phlegm and cough. In ER he required albuterol nebulizer  treatments and improved somewhat but continued to be hypoxic on exertion. Oxygen saturations fell to 83% on room air. Chest x-ray showed mild CHF versus atypical infection. BNP minimally elevated at 144.9. EKG with nonspecific changes. He was admitted with oxygen therapy as well as nebs, Solu-Medrol, and antibiotics. At discharge oxygen saturation level 88% at room air at rest. Therefore discharged home with oxygen.  COPD exacerbation:  Treated with Solu-Medrol 60 mg every 6 hours, Levaquin, DuoNeb nebulizers every 6 hours, Mucinex DM twice a day. Given oxygen support. Was discharged on a prednisone taper.  ?CHF :  Chest x-ray showed mild CHF with pulmonary vascular congestion. Given 2 doses of IV Lasix and diuresed 1.6 L. 2-D echocardiogram with mild LVH and grade 1 diastolic dysfunction with EF 65%. Troponin negative x3. EKG with normal sinus rhythm nonspecific changes.  Hyponatremia: He had mild hyponatremia on admission. Given IV fluids. Sodium increased from 129 at admission up to 134 at discharge. Per discharge summary they recommended a check recheck sodium level today at this visit.  Today patient states that he has completed  prednisone taper. He is currently wearing his oxygen nasal cannula and  has been wearing it since his hospitalization. He says that since discharge , that during the day he has been needing the albuterol nebulizer about 2-3 times per day. At night: He said that last night he did not need any albuterol nebulizer. Some nights he has had to use it once per night and some nights he has had to use it 2 times per night. States that after using the nebulizer he feels some relief but not complete relief.  Currently he feels that his breathing is almost back to his baseline.    Past Medical History  Diagnosis Date  . Diabetes mellitus   . COPD (chronic obstructive pulmonary disease)   . Hypertension   . Allergy     Rhinitis  . Elevated lipids   . Pulmonary fibrosis   . Bronchitis   . Colon polyps   . Vitamin D deficiency   . PSA elevation   . Chronic respiratory failure      Home Meds: See attached medication section for current medication list. Any medications entered into computer today will not appear on this note's list. The medications listed below were entered prior to today. Current Outpatient Prescriptions on File Prior to Visit  Medication Sig Dispense Refill  . albuterol ([name];VENTOLIN HFA) 108 (90 BASE) MCG/ACT inhaler Inhale 2 puffs into the lungs every 6 (six) hours as needed for wheezing or shortness of breath.  1 Inhaler  2  . albuterol (PROVENTIL) (2.5 MG/3ML) 0.083% nebulizer solution Take 2.5 mg by nebulization every 6 (six) hours as needed for wheezing or  shortness of breath.       Marland Kitchen aspirin EC 81 MG tablet Take 81 mg by mouth daily.      Marland Kitchen losartan (COZAAR) 100 MG tablet Take 100 mg by mouth daily.      . metFORMIN (GLUCOPHAGE) 1000 MG tablet Take 1,000 mg by mouth 2 (two) times daily.      . Omega-3 Fatty Acids (FISH OIL) 1000 MG CAPS Take 2 capsules by mouth daily.      . pravastatin (PRAVACHOL) 80 MG tablet Take 1 tablet (80 mg total) by mouth at bedtime.  90 tablet  1  . benzonatate (TESSALON) 200 MG capsule Take 1 capsule (200 mg  total) by mouth 3 (three) times daily as needed for cough.  20 capsule  0  . dextromethorphan-guaiFENesin (MUCINEX DM) 30-600 MG per 12 hr tablet Take 1 tablet by mouth 2 (two) times daily.      Marland Kitchen HYDROcodone-acetaminophen (NORCO/VICODIN) 5-325 MG per tablet Take 1 tablet by mouth every 6 (six) hours as needed for moderate pain.  20 tablet  0  . levofloxacin (LEVAQUIN) 750 MG tablet Take 1 tablet (750 mg total) by mouth daily.  3 tablet  0  . nystatin (MYCOSTATIN) 100000 UNIT/ML suspension Take 5 mLs (500,000 Units total) by mouth 4 (four) times daily.  60 mL  0  . predniSONE (DELTASONE) 10 MG tablet Take 4 tabs po daily for 2 days then 3 tabs po daily for 2 day then 2 tabs po daily for 2 days then 1 tab po daily for 2 days then stop  20 tablet  0   No current facility-administered medications on file prior to visit.    Allergies:  Allergies  Allergen Reactions  . Ace Inhibitors     Hyperkalemia--07/23/2013    History   Social History  . Marital Status: Married    Spouse Name: N/A    Number of Children: N/A  . Years of Education: N/A   Occupational History  . Not on file.   Social History Main Topics  . Smoking status: Former Smoker -- 1.50 packs/day for 60 years    Types: Cigarettes    Quit date: 12/31/2012  . Smokeless tobacco: Not on file  . Alcohol Use: No  . Drug Use: No  . Sexual Activity: Yes    Birth Control/ Protection: None   Other Topics Concern  . Not on file   Social History Narrative  . No narrative on file    Family History  Problem Relation Age of Onset  . CAD Other   . Diabetes Other      Review of Systems:  See HPI for pertinent ROS. All other ROS negative.    Physical Exam: Blood pressure 126/70, pulse 88, temperature 98.4 F (36.9 C), temperature source Oral, resp. rate 20, weight 161 lb (73.029 kg), SpO2 99.00%., Body mass index is 26.79 kg/(m^2).  oxygen saturation was checked wearing nasal cannula oxygen at 2 L. Saturation 99% with  this. General: WNWD WM. Appears in no acute distress. Neck: Supple. No thyromegaly. No lymphadenopathy. Lungs: Mild harsh wheezes throughout bilaterally. Decreased air movement/distant breath sounds throughout bilaterally. Heart: RRR with S1 S2. No murmurs, rubs, or gallops. Abdomen: Soft, non-tender, non-distended with normoactive bowel sounds. No hepatomegaly. No rebound/guarding. No obvious abdominal masses. Musculoskeletal:  Strength and tone normal for age. Extremities/Skin: Warm and dry. No clubbing or cyanosis. No edema. No rashes or suspicious lesions. Neuro: Alert and oriented X 3. Moves all extremities spontaneously. Gait  is normal. CNII-XII grossly in tact. Psych:  Responds to questions appropriately with a normal affect.     ASSESSMENT AND PLAN:  79 y.o. year old male with  1. COPD exacerbation Will add Spiriva and  Symbicort as maintenance medications to try to control his COPD and prevent any further exacerbations. He had already quit smoking-- prior to these exacerbations. We'll get his COPD controlled/stabilized then will re- assess his oxygen saturations to determine whether he needs to continue oxygen therapy.  - tiotropium (SPIRIVA HANDIHALER) 18 MCG inhalation capsule; Place 1 capsule (18 mcg total) into inhaler and inhale daily.  Dispense: 30 capsule; Refill: 12 - budesonide-formoterol (SYMBICORT) 160-4.5 MCG/ACT inhaler; Inhale 2 puffs into the lungs 2 (two) times daily.  Dispense: 1 Inhaler; Refill: 3  2. Hyponatremia - BASIC METABOLIC PANEL WITH GFR  3. Diastolic dysfunction 2-D echocardiogram during this last hospitalization showed minimal abnormality. During his prior ER visit he had a run of PAF, which was felt to be just secondary to the COPD exacerbation. However in followup of that we have gone ahead and made him an appointment to see cardiology. He does have an appointment with Dr. Martinique as a new patient to him scheduled for 12/19/13. We will go ahead and  keep this appointment so that he can followup any issues regarding any CHF as well as that episode of PAF.  4. Diabetes Right around the same time that he started having the COPD exacerbations, he had also been started on Lantus for his diabetes. We were in the process of getting the Lantus started and titrated when these exacerbations started to occur. Because he has been on prednisone, I told him to simply continue his current dose of Lantus which he says is currently at 18 units. He is to keep this at the 18 units. Schedule followup visit with me in one week . We need to get his COPD stabilized / controlled-- then can get back to addressing his Lantus titration and sugar control. I told him if his breathing worsens in the interim call me immediately.    Marin Olp [place], [place], [facility] 12/02/2013 2:03 PM

## 2013-12-08 ENCOUNTER — Encounter: Payer: Self-pay | Admitting: Physician Assistant

## 2013-12-08 ENCOUNTER — Ambulatory Visit (INDEPENDENT_AMBULATORY_CARE_PROVIDER_SITE_OTHER): Payer: Medicare Other | Admitting: Physician Assistant

## 2013-12-08 VITALS — BP 128/74 | HR 82 | Temp 98.6°F | Resp 18 | Wt 160.0 lb

## 2013-12-08 DIAGNOSIS — E119 Type 2 diabetes mellitus without complications: Secondary | ICD-10-CM

## 2013-12-08 DIAGNOSIS — J449 Chronic obstructive pulmonary disease, unspecified: Secondary | ICD-10-CM

## 2013-12-08 NOTE — Progress Notes (Signed)
Patient ID: Edward [name] MRN: [id number], DOB: [birth year], 79 y.o. Date of Encounter: @DATE @  Chief Complaint:  Chief Complaint  Patient presents with  . COPD  . Diabetes    HPI: 79 y.o. year old male  presents for followup of recent COPD exacerbations. Also brought in his blood sugar log to review regarding his diabetes.  See his last office visit note for summary of his COPD exacerbations. He ended up having several ER visits regarding COPD exacerbations. At his last visit with me on 12/01/13 I added Spiriiva and Symbicort. He reports that he is using both of these as directed. He reports that his breathing is back to baseline. Having no significant amount of cough now and no episodes of wheezing/increased shortness of breath. He is still wearing his nasal cannula oxygen which was started at the last hospitalization.  As well he did bring in his blood sugar log. He has been checking fasting morning blood sugars every morning. Not checking at any other time of day. He has been on 18 units every night since February 8th.   Past Medical History  Diagnosis Date  . Diabetes mellitus   . COPD (chronic obstructive pulmonary disease)   . Hypertension   . Allergy     Rhinitis  . Elevated lipids   . Pulmonary fibrosis   . Bronchitis   . Colon polyps   . Vitamin D deficiency   . PSA elevation   . Chronic respiratory failure      Home Meds: See attached medication section for current medication list. Any medications entered into computer today will not appear on this note's list. The medications listed below were entered prior to today. Current Outpatient Prescriptions on File Prior to Visit  Medication Sig Dispense Refill  . albuterol ([name];VENTOLIN HFA) 108 (90 BASE) MCG/ACT inhaler Inhale 2 puffs into the lungs every 6 (six) hours as needed for wheezing or shortness of breath.  1 Inhaler  2  . albuterol (PROVENTIL) (2.5 MG/3ML) 0.083% nebulizer solution Take 2.5 mg by  nebulization every 6 (six) hours as needed for wheezing or shortness of breath.       Marland Kitchen aspirin EC 81 MG tablet Take 81 mg by mouth daily.      . benzonatate (TESSALON) 200 MG capsule Take 1 capsule (200 mg total) by mouth 3 (three) times daily as needed for cough.  20 capsule  0  . budesonide-formoterol (SYMBICORT) 160-4.5 MCG/ACT inhaler Inhale 2 puffs into the lungs 2 (two) times daily.  1 Inhaler  3  . dextromethorphan-guaiFENesin (MUCINEX DM) 30-600 MG per 12 hr tablet Take 1 tablet by mouth 2 (two) times daily.      Marland Kitchen HYDROcodone-acetaminophen (NORCO/VICODIN) 5-325 MG per tablet Take 1 tablet by mouth every 6 (six) hours as needed for moderate pain.  20 tablet  0  . Insulin Glargine (LANTUS) 100 UNIT/ML Solostar Pen Inject 18 Units into the skin at bedtime.      Marland Kitchen levofloxacin (LEVAQUIN) 750 MG tablet Take 1 tablet (750 mg total) by mouth daily.  3 tablet  0  . losartan (COZAAR) 100 MG tablet Take 100 mg by mouth daily.      . metFORMIN (GLUCOPHAGE) 1000 MG tablet Take 1,000 mg by mouth 2 (two) times daily.      Marland Kitchen nystatin (MYCOSTATIN) 100000 UNIT/ML suspension Take 5 mLs (500,000 Units total) by mouth 4 (four) times daily.  60 mL  0  . Omega-3 Fatty Acids (FISH OIL)  1000 MG CAPS Take 2 capsules by mouth daily.      . pravastatin (PRAVACHOL) 80 MG tablet Take 1 tablet (80 mg total) by mouth at bedtime.  90 tablet  1  . predniSONE (DELTASONE) 10 MG tablet Take 4 tabs po daily for 2 days then 3 tabs po daily for 2 day then 2 tabs po daily for 2 days then 1 tab po daily for 2 days then stop  20 tablet  0  . tiotropium (SPIRIVA HANDIHALER) 18 MCG inhalation capsule Place 1 capsule (18 mcg total) into inhaler and inhale daily.  30 capsule  12   No current facility-administered medications on file prior to visit.    Allergies:  Allergies  Allergen Reactions  . Ace Inhibitors     Hyperkalemia--07/23/2013    History   Social History  . Marital Status: Married    Spouse Name: N/A     Number of Children: N/A  . Years of Education: N/A   Occupational History  . Not on file.   Social History Main Topics  . Smoking status: Former Smoker -- 1.50 packs/day for 60 years    Types: Cigarettes    Quit date: 12/31/2012  . Smokeless tobacco: Not on file  . Alcohol Use: No  . Drug Use: No  . Sexual Activity: Yes    Birth Control/ Protection: None   Other Topics Concern  . Not on file   Social History Narrative  . No narrative on file    Family History  Problem Relation Age of Onset  . CAD Other   . Diabetes Other      Review of Systems:  See HPI for pertinent ROS. All other ROS negative.    Physical Exam: Blood pressure 128/74, pulse 82, temperature 98.6 F (37 C), resp. rate 18, weight 160 lb (72.576 kg), SpO2 98.00%., Body mass index is 26.63 kg/(m^2). Oxygen saturation on 2 L nasal cannula oxygen sitting Is 98%. Oxygen saturation on room air at rest is 95% to 96%. Oxygen saturation on room air with exertion ranges 94% to a low of 92%.  General: WNWD WM. Appears in no acute distress. Neck: Supple. No thyromegaly. No lymphadenopathy. Lungs: He has distant/decreased breath sounds throughout but is clear. I hear a very tiny wheeze at the right base only but no other wheeze at all. Heart: RRR with S1 S2. No murmurs, rubs, or gallops. Abdomen: Soft, non-tender, non-distended with normoactive bowel sounds. No hepatomegaly. No rebound/guarding. No obvious abdominal masses. Musculoskeletal:  Strength and tone normal for age. Extremities/Skin: Warm and dry. No clubbing or cyanosis. No edema. No rashes or suspicious lesions. Neuro: Alert and oriented X 3. Moves all extremities spontaneously. Gait is normal. CNII-XII grossly in tact. Psych:  Responds to questions appropriately with a normal affect.     ASSESSMENT AND PLAN:  79 y.o. year old male with  1. Diabetes At office visit 10/30/13 is when we stopped the following  medications: Glimepiride/Amaryl Pioglitazone/Actos Januvia  At that visit we planned the following January 28--15 units January 29--16 units January 30--17 units January 31--18 units After this is when he started having problems with COPD exacerbations. Therefore he continued at 18 units ever since then. Therefore he is still at 18 units now--because at recent visits here he had been following up  COPD exacerbations I told him to stay at 18 units until we could see what his BS would run once  the prednisone effects wore off and could see what his  glucose would do. He is currently on 18 units and has been on this dose ever since the above date. He has been on no prednisone since the last ER/hospital visit. Blood sugars fasting in the morning over the last week have ranged from 104 - 153. Therefore I told him to continue   18 units.  2. COPD (chronic obstructive pulmonary disease) Continue the Spiriva and the Symbicort. Discontinue the oxygen.  Call me if he starts having any increased shortness of breath, wheezing, coughing. Call me if his blood sugars changed significantly. Otherwise followup office visit 3 months.  He wants to keep his oxygen available for now. However I will need to READDRESS THIS AT HIS NEXT VISIT visit and if his oxygen saturations remain normal we'll need to stop this.   Signed, [address] [place], [place], [facility] 12/08/2013 11:01 AM

## 2013-12-19 ENCOUNTER — Ambulatory Visit (INDEPENDENT_AMBULATORY_CARE_PROVIDER_SITE_OTHER): Payer: Medicare Other | Admitting: Cardiology

## 2013-12-19 ENCOUNTER — Encounter: Payer: Self-pay | Admitting: Cardiology

## 2013-12-19 VITALS — BP 140/72 | HR 83 | Ht 65.0 in | Wt 162.0 lb

## 2013-12-19 DIAGNOSIS — I10 Essential (primary) hypertension: Secondary | ICD-10-CM

## 2013-12-19 DIAGNOSIS — J449 Chronic obstructive pulmonary disease, unspecified: Secondary | ICD-10-CM

## 2013-12-19 DIAGNOSIS — R0600 Dyspnea, unspecified: Secondary | ICD-10-CM | POA: Insufficient documentation

## 2013-12-19 DIAGNOSIS — E785 Hyperlipidemia, unspecified: Secondary | ICD-10-CM

## 2013-12-19 DIAGNOSIS — R0609 Other forms of dyspnea: Secondary | ICD-10-CM

## 2013-12-19 DIAGNOSIS — R0989 Other specified symptoms and signs involving the circulatory and respiratory systems: Secondary | ICD-10-CM

## 2013-12-19 NOTE — Patient Instructions (Signed)
We will schedule you for a nuclear stress test  Continue your current therapy   

## 2013-12-20 NOTE — Progress Notes (Signed)
[place] Date of Birth: 1935-06-06 Medical Record #[id number]  History of Present Illness: Mr. Edward [name] is seen at the request of Karis Juba PA-C for evaluation of dyspnea. He is a 79 yo WM with history of severe COPD. He had a long history of tobacco abuse 2 ppd. He quit is Apr. 2014. He also previously worked in a copper plant and a brick yard. He also has a history of DM, HTN, and hyperlipidemia. No known history of cardiac disease. He was admitted in February 2015 with acute respiratory failure with dyspnea and cough productive of thick phlegm. He was treated with oxygen, steroids, inhalers, and antibiotics with improvement. He was noted to have ? Of mild CHF on CXR and was diuresed but was not discharged on diuretics. He denies any chest pain but does complain of a lot of indigestion. Echo in the hospital showed mild LVH with grade 1 diastolic dysfunction and vigorous systolic function. No prior ischemic workup.  Current Outpatient Prescriptions on File Prior to Visit  Medication Sig Dispense Refill  . albuterol ([name];VENTOLIN HFA) 108 (90 BASE) MCG/ACT inhaler Inhale 2 puffs into the lungs every 6 (six) hours as needed for wheezing or shortness of breath.  1 Inhaler  2  . albuterol (PROVENTIL) (2.5 MG/3ML) 0.083% nebulizer solution Take 2.5 mg by nebulization every 6 (six) hours as needed for wheezing or shortness of breath.       Marland Kitchen aspirin EC 81 MG tablet Take 81 mg by mouth daily.      . budesonide-formoterol (SYMBICORT) 160-4.5 MCG/ACT inhaler Inhale 2 puffs into the lungs 2 (two) times daily.  1 Inhaler  3  . dextromethorphan-guaiFENesin (MUCINEX DM) 30-600 MG per 12 hr tablet Take 1 tablet by mouth 2 (two) times daily.      . Insulin Glargine (LANTUS) 100 UNIT/ML Solostar Pen Inject 18 Units into the skin at bedtime.      Marland Kitchen losartan (COZAAR) 100 MG tablet Take 100 mg by mouth daily.      . metFORMIN (GLUCOPHAGE) 1000 MG tablet Take 1,000 mg by mouth 2 (two) times  daily.      Marland Kitchen nystatin (MYCOSTATIN) 100000 UNIT/ML suspension Take 5 mLs (500,000 Units total) by mouth 4 (four) times daily.  60 mL  0  . Omega-3 Fatty Acids (FISH OIL) 1000 MG CAPS Take 2 capsules by mouth daily.      . pravastatin (PRAVACHOL) 80 MG tablet Take 1 tablet (80 mg total) by mouth at bedtime.  90 tablet  1  . predniSONE (DELTASONE) 10 MG tablet Take 4 tabs po daily for 2 days then 3 tabs po daily for 2 day then 2 tabs po daily for 2 days then 1 tab po daily for 2 days then stop  20 tablet  0  . tiotropium (SPIRIVA HANDIHALER) 18 MCG inhalation capsule Place 1 capsule (18 mcg total) into inhaler and inhale daily.  30 capsule  12   No current facility-administered medications on file prior to visit.    Allergies  Allergen Reactions  . Ace Inhibitors     Hyperkalemia--07/23/2013    Past Medical History  Diagnosis Date  . Diabetes mellitus   . COPD (chronic obstructive pulmonary disease)   . Hypertension   . Allergy     Rhinitis  . Elevated lipids   . Pulmonary fibrosis   . Bronchitis   . Colon polyps   . Vitamin D deficiency   . PSA elevation   . Chronic  respiratory failure   . Hypercholesterolemia     Past Surgical History  Procedure Laterality Date  . Cataract extraction w/phaco  06/25/2012    Procedure: CATARACT EXTRACTION PHACO AND INTRAOCULAR LENS PLACEMENT (IOC);  Surgeon: Elta Guadeloupe T. Gershon Crane, MD;  Location: [facility];  Service: Ophthalmology;  Laterality: Left;  CDE=19.01  . Cataract extraction w/phaco  07/09/2012    Procedure: CATARACT EXTRACTION PHACO AND INTRAOCULAR LENS PLACEMENT (IOC);  Surgeon: Elta Guadeloupe T. Gershon Crane, MD;  Location: [facility];  Service: Ophthalmology;  Laterality: Right;  CDE: 20.09    History  Smoking status  . Former Smoker -- 1.50 packs/day for 60 years  . Types: Cigarettes  . Quit date: 12/31/2012  Smokeless tobacco  . Not on file    History  Alcohol Use No    Family History  Problem Relation Age of Onset  . CAD Other   . Diabetes  Other   . Heart disease Mother     Review of Systems: As noted in HPI.  All other systems were reviewed and are negative.  Physical Exam: BP 140/72  Pulse 83  Ht 5\' 5"  (1.651 m)  Wt 162 lb (73.483 kg)  BMI 26.96 kg/m2 He is a chronically ill appearing WM in NAD.  HEENT: [place]/AT, PERRL, EOMI, oropharynx is clear. Neck: No JVD, adenopathy, thyromegaly, or bruits. Lungs: diminished BS without rales or rhonchi. CV: RRR, normal S1-2. No gallop or murmur. Abdomen: soft BS+, NT, no masses or HSM Ext: no edema, pedal pulses are palpable. Skin: warm and dry. Neuro: alert and oriented x 3. CN II-XII intact. Gait is normal.  LABORATORY DATA: Ecg: NSR rate 83 bpm. Normal Ecg  Lab Results  Component Value Date   WBC 19.7* 11/17/2013   HGB 13.0 11/17/2013   HCT 39.0 11/17/2013   PLT 322 11/17/2013   GLUCOSE 254* 12/01/2013   CHOL 156 07/23/2013   TRIG 199* 07/23/2013   HDL 44 07/23/2013   LDLCALC 72 07/23/2013   ALT 37 11/16/2013   AST 16 11/16/2013   NA 132* 12/01/2013   K 4.7 12/01/2013   CL 95* 12/01/2013   CREATININE 0.83 12/01/2013   BUN 10 12/01/2013   CO2 31 12/01/2013   HGBA1C 9.4* 10/23/2013   MICROALBUR 4.75* 04/21/2013   Echo:Study Conclusions  - Procedure narrative: Transthoracic echocardiography. Image quality was suboptimal, due to poor sound transmission. - Left ventricle: The cavity size was normal. Wall thickness was increased in a pattern of mild LVH. Systolic function was vigorous. The estimated ejection fraction was in the range of 65% to 70%. Wall motion was normal; there were no regional wall motion abnormalities. There was an increased relative contribution of atrial contraction to ventricular filling. Doppler parameters are consistent with abnormal left ventricular relaxation (grade 1 diastolic dysfunction). - Aortic valve: Mildly thickened leaflets. No gradients or velocities obtained across aortic valve, so unable to comment on evidence for valvular stenosis. No  significant regurgitation. - Mitral valve: Mildly thickened leaflets . Trivial regurgitation. - Left atrium: The atrium was mildly dilated.    Assessment / Plan: 1. Acute diastolic CHF. I think was a fairly minor component of his recent hospital stay. I think most of his respiratory failure was related to COPD exacerbation. BNP level was unimpressive. No need for diuretics at this point.  2. Severe COPD   3. Symptoms of chest pain described as indigestion. With his multiple cardiac risk factors I think ischemic evaluation is in order. Will proceed with Lexiscan myoview study. If this  is normal would focus on treating underlying pulmonary disease. If abnormal would consider cardiac cath.   4. DM type 2  5. HTN controlled.  6. Hyperlipidemia.

## 2013-12-25 ENCOUNTER — Other Ambulatory Visit: Payer: Self-pay | Admitting: Physician Assistant

## 2013-12-25 NOTE — Telephone Encounter (Signed)
Refill appropriate and filled per protocol. 

## 2014-01-01 ENCOUNTER — Encounter: Payer: Self-pay | Admitting: Cardiovascular Disease

## 2014-01-01 ENCOUNTER — Ambulatory Visit (HOSPITAL_COMMUNITY): Payer: Medicare Other | Attending: Cardiovascular Disease | Admitting: Radiology

## 2014-01-01 VITALS — BP 146/70 | HR 67 | Ht 65.0 in | Wt 161.0 lb

## 2014-01-01 DIAGNOSIS — R0989 Other specified symptoms and signs involving the circulatory and respiratory systems: Secondary | ICD-10-CM | POA: Insufficient documentation

## 2014-01-01 DIAGNOSIS — Z8249 Family history of ischemic heart disease and other diseases of the circulatory system: Secondary | ICD-10-CM | POA: Insufficient documentation

## 2014-01-01 DIAGNOSIS — R079 Chest pain, unspecified: Secondary | ICD-10-CM | POA: Insufficient documentation

## 2014-01-01 DIAGNOSIS — R0602 Shortness of breath: Secondary | ICD-10-CM

## 2014-01-01 DIAGNOSIS — R9439 Abnormal result of other cardiovascular function study: Secondary | ICD-10-CM | POA: Insufficient documentation

## 2014-01-01 DIAGNOSIS — J449 Chronic obstructive pulmonary disease, unspecified: Secondary | ICD-10-CM | POA: Insufficient documentation

## 2014-01-01 DIAGNOSIS — J4489 Other specified chronic obstructive pulmonary disease: Secondary | ICD-10-CM | POA: Insufficient documentation

## 2014-01-01 DIAGNOSIS — R0609 Other forms of dyspnea: Secondary | ICD-10-CM | POA: Insufficient documentation

## 2014-01-01 DIAGNOSIS — I10 Essential (primary) hypertension: Secondary | ICD-10-CM

## 2014-01-01 DIAGNOSIS — E785 Hyperlipidemia, unspecified: Secondary | ICD-10-CM

## 2014-01-01 DIAGNOSIS — Z87891 Personal history of nicotine dependence: Secondary | ICD-10-CM | POA: Insufficient documentation

## 2014-01-01 DIAGNOSIS — R0600 Dyspnea, unspecified: Secondary | ICD-10-CM

## 2014-01-01 MED ORDER — TECHNETIUM TC 99M SESTAMIBI GENERIC - CARDIOLITE
11.0000 | Freq: Once | INTRAVENOUS | Status: AC | PRN
Start: 2014-01-01 — End: 2014-01-01
  Administered 2014-01-01: 11 via INTRAVENOUS

## 2014-01-01 MED ORDER — REGADENOSON 0.4 MG/5ML IV SOLN
0.4000 mg | Freq: Once | INTRAVENOUS | Status: AC
  Administered 2014-01-01: 0.4 mg via INTRAVENOUS

## 2014-01-01 MED ORDER — TECHNETIUM TC 99M SESTAMIBI GENERIC - CARDIOLITE
33.0000 | Freq: Once | INTRAVENOUS | Status: AC | PRN
Start: 2014-01-01 — End: 2014-01-01
  Administered 2014-01-01: 33 via INTRAVENOUS

## 2014-01-01 NOTE — Progress Notes (Signed)
Edward [name] [address] [place], [place] [address] [phone]    Cardiology Nuclear [name] Edward [name] is a 79 y.o. male     MRN : [id number]     DOB: 12-14-1934  Procedure Date: 01/01/2014  Nuclear [name] Background Indication for Stress Test:  Evaluation for Ischemia History:  no prior cardiac hx; Echo 2015-EF 65-70%; COPD Cardiac Risk Factors: Family History - CAD, History of Smoking, Hypertension and Lipids  Symptoms:  Chest Pain and DOE   Nuclear Pre-Procedure Caffeine/Decaff Intake:  None NPO After: 4:00pm   Lungs:  clear O2 Sat: 95% on room air. IV 0.9% NS with Angio Cath:  20g  IV Site: R Hand  IV Started by:  Annye Rusk, CNMT  Chest Size (in):  44 Cup Size: n/a  Height: 5\' 5"  (1.651 m)  Weight:  161 lb (73.029 kg)  BMI:  Body mass index is 26.79 kg/(m^2). Tech Comments:  Held metformin this am, took all other meds; CBG 115 at 3am    Nuclear [name] Study 1 or 2 day study: 1 day  Stress Test Type:  Carlton Adam  Reading MD: Edward Ruths, MD  Order Authorizing Provider:  P. Martinique, MD  Resting Radionuclide: Technetium 38m Sestamibi  Resting Radionuclide Dose: 11.0 mCi   Stress Radionuclide:  Technetium 22m Sestamibi  Stress Radionuclide Dose: 33.0 mCi           Stress Protocol Rest HR: 67 Stress HR: 101  Rest BP: 146/70 Stress BP: 148/83  Exercise Time (min): n/a METS: n/a           Dose of Adenosine (mg):  n/a Dose of Lexiscan: 0.4 mg  Dose of Atropine (mg): n/a Dose of Dobutamine: n/a mcg/kg/min (at max HR)  Stress Test Technologist: Glade Lloyd, BS-ES  Nuclear Technologist:  Charlton Amor, CNMT     Rest Procedure:  Myocardial perfusion imaging was performed at rest 45 minutes following the intravenous administration of Technetium 61m Sestamibi. Rest ECG: NSR, cannot RO prior septal MI.  Stress Procedure:  The patient received IV Lexiscan 0.4 mg over 15-seconds.  Technetium 33m Sestamibi injected at 30-seconds.   Quantitative spect images were obtained after a 45 minute delay. During the infusion of Lexiscan, the patient complained of fatigue.  This began to resolve in recovery.  Stress ECG: No significant ST segment change suggestive of ischemia.  QPS Raw Data Images:  Acquisition technically good; normal left ventricular size. Stress Images:  There is decreased uptake in the inferior wall. Rest Images:  There is decreased uptake in the inferior wall, less prominent compared to the stress images. Subtraction (SDS):  These findings are consistent with prior inferior infarct and mild peri-infarct ischemia. Transient Ischemic Dilatation (Normal <1.22):  1.00 Lung/Heart Ratio (Normal <0.45):  0.24  Quantitative Gated Spect Images QGS EDV:  73 ml QGS ESV:  23 ml  Impression Exercise Capacity:  Lexiscan with no exercise. BP Response:  Normal blood pressure response. Clinical Symptoms:  No chest pain or dyspnea. ECG Impression:  No significant ST segment change suggestive of ischemia. Comparison with Prior Nuclear Study: No previous nuclear study performed  Overall Impression:  Low risk stress nuclear study with a moderate size, moderate intensity, partially reversible inferior defect consistent with prior inferior infarct and very mild peri-infarct ischemia.  LV Ejection Fraction: 69%.  LV Wall Motion:  NL LV Function; NL Wall Motion  Edward [name]

## 2014-01-13 ENCOUNTER — Ambulatory Visit: Payer: Medicare Other | Admitting: Cardiology

## 2014-01-21 ENCOUNTER — Ambulatory Visit: Payer: Medicare Other | Admitting: Physician Assistant

## 2014-01-22 ENCOUNTER — Other Ambulatory Visit: Payer: Self-pay | Admitting: Physician Assistant

## 2014-01-22 NOTE — Telephone Encounter (Signed)
Medication refilled per protocol. 

## 2014-03-11 ENCOUNTER — Ambulatory Visit: Payer: Medicare Other | Admitting: Physician Assistant

## 2014-03-12 ENCOUNTER — Encounter: Payer: Self-pay | Admitting: Physician Assistant

## 2014-03-12 ENCOUNTER — Encounter: Payer: Self-pay | Admitting: Family Medicine

## 2014-03-12 ENCOUNTER — Ambulatory Visit (INDEPENDENT_AMBULATORY_CARE_PROVIDER_SITE_OTHER): Payer: Medicare Other | Admitting: Physician Assistant

## 2014-03-12 VITALS — BP 116/70 | HR 76 | Temp 98.3°F | Resp 18 | Wt 161.0 lb

## 2014-03-12 DIAGNOSIS — J449 Chronic obstructive pulmonary disease, unspecified: Secondary | ICD-10-CM

## 2014-03-12 DIAGNOSIS — R972 Elevated prostate specific antigen [PSA]: Secondary | ICD-10-CM

## 2014-03-12 DIAGNOSIS — I5189 Other ill-defined heart diseases: Secondary | ICD-10-CM

## 2014-03-12 DIAGNOSIS — K635 Polyp of colon: Secondary | ICD-10-CM

## 2014-03-12 DIAGNOSIS — I10 Essential (primary) hypertension: Secondary | ICD-10-CM

## 2014-03-12 DIAGNOSIS — E119 Type 2 diabetes mellitus without complications: Secondary | ICD-10-CM

## 2014-03-12 DIAGNOSIS — D126 Benign neoplasm of colon, unspecified: Secondary | ICD-10-CM

## 2014-03-12 DIAGNOSIS — E785 Hyperlipidemia, unspecified: Secondary | ICD-10-CM

## 2014-03-12 DIAGNOSIS — I519 Heart disease, unspecified: Secondary | ICD-10-CM

## 2014-03-12 DIAGNOSIS — J8410 Pulmonary fibrosis, unspecified: Secondary | ICD-10-CM

## 2014-03-12 LAB — HEMOGLOBIN A1C
Hgb A1c MFr Bld: 11.9 % — ABNORMAL HIGH (ref <5.7)
MEAN PLASMA GLUCOSE: 295 mg/dL — AB (ref <117)

## 2014-03-12 LAB — COMPLETE METABOLIC PANEL WITH GFR
ALK PHOS: 166 U/L — AB (ref 39–117)
ALT: 177 U/L — ABNORMAL HIGH (ref 0–53)
AST: 85 U/L — AB (ref 0–37)
Albumin: 4.2 g/dL (ref 3.5–5.2)
BUN: 12 mg/dL (ref 6–23)
CO2: 26 meq/L (ref 19–32)
CREATININE: 0.89 mg/dL (ref 0.50–1.35)
Calcium: 9.1 mg/dL (ref 8.4–10.5)
Chloride: 97 mEq/L (ref 96–112)
GFR, Est Non African American: 83 mL/min
Glucose, Bld: 214 mg/dL — ABNORMAL HIGH (ref 70–99)
Potassium: 4.6 mEq/L (ref 3.5–5.3)
Sodium: 132 mEq/L — ABNORMAL LOW (ref 135–145)
Total Bilirubin: 0.5 mg/dL (ref 0.2–1.2)
Total Protein: 6.4 g/dL (ref 6.0–8.3)

## 2014-03-12 LAB — LIPID PANEL
Cholesterol: 144 mg/dL (ref 0–200)
HDL: 53 mg/dL (ref >39)
LDL Cholesterol: 58 mg/dL (ref 0–99)
Total CHOL/HDL Ratio: 2.7 Ratio
Triglycerides: 163 mg/dL — ABNORMAL HIGH (ref <150)
VLDL: 33 mg/dL (ref 0–40)

## 2014-03-12 NOTE — Progress Notes (Signed)
Patient ID: DONTAVIUS KEIM MRN: [id number], DOB: 08-31-1935, 79 y.o. Date of Encounter: @DATE @  Chief Complaint:  Chief Complaint  Patient presents with  . 3 mth check up    is fasting, not taking medication correctly    HPI: 79 y.o. year old male  presents for routine followup office visit.   Last regular office visit with me just routine was 10/23/13. Then he had a followup office visit with me 10/30/13 at which time we stopped multiple oral medicines and started Lantus. On 11/04/13 he had an ER visit with COPD. On 11/06/13 he had another ER visit with COPD. 11/10/2013 he had office visit with me to followup with COPD. 11/15/13 he was hospitalized with COPD. 12/01/13 have followup office visit with me regarding his COPD. 12/08/13 he had another followup office visit with me.  I had placed him on Spiriva and Symbicort for his COPD. Today he says that he is no longer taking these secondary to the expense. Says that he can tell a difference. Can tell that his breathing was improved when he was on these medications compared to when  he is off of these medicines.  He is taking his diabetes medications as directed. Currently on Lantus 18 units. He did bring in BS log sheets with him today.  He only checks fasting blood sugars but has been checking them every morning. He has been documented for April May and June. All of his readings are very consistent. Almost all readings are ranging between 135-165.  He is taking his blood pressure medicines as directed. No adverse effects. In the past he had high potassium with ACE inhibitors and was changed to ARB.  Still taking pravastatin. No myalgias or other adverse effects.  He continues to stay off of his cigarettes.  He has no complaints today. Even with exertion he is having no angina symptoms.   Past Medical History  Diagnosis Date  . Diabetes mellitus   . COPD (chronic obstructive pulmonary disease)   . Hypertension   . Allergy    Rhinitis  . Elevated lipids   . Pulmonary fibrosis   . Bronchitis   . Colon polyps   . Vitamin D deficiency   . PSA elevation   . Chronic respiratory failure   . Hypercholesterolemia      Home Meds: Outpatient Prescriptions Prior to Visit  Medication Sig Dispense Refill  . albuterol ([name];VENTOLIN HFA) 108 (90 BASE) MCG/ACT inhaler Inhale 2 puffs into the lungs every 6 (six) hours as needed for wheezing or shortness of breath.  1 Inhaler  2  . albuterol (PROVENTIL) (2.5 MG/3ML) 0.083% nebulizer solution Take 2.5 mg by nebulization every 6 (six) hours as needed for wheezing or shortness of breath.       Marland Kitchen aspirin EC 81 MG tablet Take 81 mg by mouth daily.      . Insulin Glargine (LANTUS) 100 UNIT/ML Solostar Pen Inject 18 Units into the skin at bedtime.      Marland Kitchen losartan (COZAAR) 100 MG tablet TAKE ONE TABLET BY MOUTH ONCE DAILY  90 tablet  0  . metFORMIN (GLUCOPHAGE) 1000 MG tablet Take 1,000 mg by mouth 2 (two) times daily.      . Omega-3 Fatty Acids (FISH OIL) 1000 MG CAPS Take 2 capsules by mouth daily.      . pravastatin (PRAVACHOL) 80 MG tablet TAKE ONE TABLET BY MOUTH ONCE DAILY AT BEDTIME  90 tablet  0  . tiotropium (SPIRIVA HANDIHALER)  18 MCG inhalation capsule Place 1 capsule (18 mcg total) into inhaler and inhale daily.  30 capsule  12  . budesonide-formoterol (SYMBICORT) 160-4.5 MCG/ACT inhaler Inhale 2 puffs into the lungs 2 (two) times daily.  1 Inhaler  3  . dextromethorphan-guaiFENesin (MUCINEX DM) 30-600 MG per 12 hr tablet Take 1 tablet by mouth 2 (two) times daily.      Marland Kitchen nystatin (MYCOSTATIN) 100000 UNIT/ML suspension Take 5 mLs (500,000 Units total) by mouth 4 (four) times daily.  60 mL  0  . predniSONE (DELTASONE) 10 MG tablet Take 4 tabs po daily for 2 days then 3 tabs po daily for 2 day then 2 tabs po daily for 2 days then 1 tab po daily for 2 days then stop  20 tablet  0   No facility-administered medications prior to visit.     Allergies:  Allergies    Allergen Reactions  . Ace Inhibitors     Hyperkalemia--07/23/2013    History   Social History  . Marital Status: Married    Spouse Name: N/A    Number of Children: N/A  . Years of Education: N/A   Occupational History  . 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 Smoking status: Former Smoker -- 1.50 packs/day for 60 years    Types: Cigarettes    Quit date: 12/31/2012  . Smokeless tobacco: Never Used  . Alcohol Use: No  . Drug Use: No  . Sexual Activity: Yes    Birth Control/ Protection: None   Other Topics Concern  . Not on file   Social History Narrative  . No narrative on file    Family History  Problem Relation Age of Onset  . CAD Other   . Diabetes Other   . Heart disease Mother      Review of Systems:  See HPI for pertinent ROS. All other ROS negative.    Physical Exam: Blood pressure 116/70, pulse 76, temperature 98.3 F (36.8 C), temperature source Oral, resp. rate 18, weight 161 lb (73.029 kg)., Body mass index is 26.79 kg/(m^2). General: WNWD WM, "Weathered Appearance" Appears in no acute distress. Neck: Supple. No thyromegaly. No lymphadenopathy. No carotid bruits. Lungs: Clear bilaterally to auscultation without wheezes, rales, or rhonchi. Breathing is unlabored. Heart: RRR with S1 S2. No murmurs, rubs, or gallops. Abdomen: Soft, non-tender, non-distended with normoactive bowel sounds. No hepatomegaly. No rebound/guarding. No obvious abdominal masses. Musculoskeletal:  Strength and tone normal for age. Extremities/Skin: Warm and dry. No clubbing or cyanosis. No edema. No rashes or suspicious lesions. Neuro: Alert and oriented X 3. Moves all extremities spontaneously. Gait is normal. CNII-XII grossly in tact. Psych:  Responds to questions appropriately with a normal affect. Diabetic foot exam: Flexion is normal. No Winzer concerning lesions. Sensation is intact. On the right: 2+ dorsalis pedis 2+ PT. On the left 1+ DP. 2+ PT.       ASSESSMENT AND PLAN:  79 y.o. year old male with   1. History of repeated noncompliance in the past secondary to finances.  In the past he frequently could not afford all of his oral diabetic medications. Today he reports he is unable to afford his Spiriva and Symbicort. Today I have provided him with samples of both Spiriva and Symbicort. As well I checked our samples but we currently have no samples of Lantus. [name] is going to Water quality scientist for [employer] to see whether they can help provide some assistance  to him.  2. history of smoking. He quit smoking January 2014. Congratulations and staying off of the cigarettes!!  3. COPD Status post ER visit for COPD on 11/04/13 Nondisplaced ER visit for COPD on 11/06/13 Status post hospitalization for COPD exacerbation 11/15/13 See HPI regarding Spiriva and Symbicort.   3. Diabetes He is currently on Lantus 18 units daily. He did bring in her blood sugar along with fasting blood sugars every morning and they look good. See history of present illness.  - Hemoglobin A1c  Microalbumin 04/21/13 I have discussed need for routine eye exams and dental exams but he hasn't been able to followup with the secondary to finances. On ARB. (H/O hyperkalemia with ACE Inh) On statin.  2. Hypertension At Lab 07/23/13 his potassium was even higher. It had been borderline high prior to this. At that time his ACE inhibitor was stopped and he was started on ARB. - BASIC METABOLIC PANEL WITH GFR  3. Elevated lipids Lipids were at goal and excellent at Lab 07/23/13. LFTs normal. He is on pravastatin.   5. Pulmonary fibrosis  6. Elevated PSA He had elevated PSA at Lab 07/2012. Also prior to this PSA was elevated. He has been informed this could be secondary to prostate cancer but refused followup with urology. He still refuses to recheck PSA. He is aware that he could have prostate cancer but does not want further evaluation or followup with his AT  ALL.  7. Colon polyps History of Hemoccult-positive stool x3 in 07/2008. Refused GI evaluation despite being informed of risk of severe GI bleed, cancer, et Ronney Asters. H./H. normal 03/2010 and 01/2011. Refuses colonoscopy.  8. Immunizations: We have discussed influenza vaccine earlier this season and he is deferred. Pneumonia vaccine: He received Pneumovax June 2003.  Received  Prevnar 13  10/23/2013 .  Tetanus: Gave in September 2005. Not covered by Medicare --therefore has deferred f/u sec to cost.   He is going to schedule followup office visit in one month so that we can followup to make sure he is followed up with partnerships for community or some type of assistance. Have given him enough Symbicort samples to last until then. I gave him 2 boxes of Spiriva, which is all that we have.  Routine office visit 3 months or sooner if needed.   Marin Olp [place], [place], [facility] 03/12/2014 2:04 PM

## 2014-03-13 ENCOUNTER — Telehealth: Payer: Self-pay | Admitting: Family Medicine

## 2014-03-13 DIAGNOSIS — R7989 Other specified abnormal findings of blood chemistry: Secondary | ICD-10-CM

## 2014-03-13 DIAGNOSIS — R945 Abnormal results of liver function studies: Principal | ICD-10-CM

## 2014-03-13 NOTE — Telephone Encounter (Signed)
Pt aware of lab results and provider recommendations.  Acknowledged understanding about 2 hr PP sugars.  To STOP pravastatin for now until further notice.  2 week appt made.

## 2014-03-13 NOTE — Telephone Encounter (Signed)
Message copied by Olena Mater on Fri Mar 13, 2014 11:50 AM ------      Message from: Dena Billet      Created: Fri Mar 13, 2014  6:56 AM       Liver numbers are elevated,  which is new.  STOP the  pravastatin. Come for lab to recheck LFTs in 2 weeks. Place order for future hepatic function.      At his visit yesterday, he brought in blood sugar log sheet. He had documented fasting blood sugars every morning and these readings looked very good.      However, his A1c is very high.  Tell him to start checking blood sugars 2 hours after meals and documenting these on the blood sugar log sheet.      At yesterday's visit we had planned for him to followup in one month. Tell him that I really need him to go ahead and come back in in 2 weeks      Bring that blood sugar log sheet with him. Can recheck LFTs at the time of that office visit as well--if he is agreeable to come in for OV in 2 weeks. ------

## 2014-03-20 ENCOUNTER — Emergency Department (HOSPITAL_COMMUNITY)
Admission: EM | Admit: 2014-03-20 | Discharge: 2014-03-20 | Disposition: A | Discharge status: Home / Self Care | Payer: Medicare Other | Attending: Emergency Medicine | Admitting: Emergency Medicine

## 2014-03-20 ENCOUNTER — Emergency Department (HOSPITAL_COMMUNITY): Payer: Medicare Other

## 2014-03-20 ENCOUNTER — Encounter (HOSPITAL_COMMUNITY): Payer: Self-pay | Admitting: Emergency Medicine

## 2014-03-20 DIAGNOSIS — Z7982 Long term (current) use of aspirin: Secondary | ICD-10-CM | POA: Insufficient documentation

## 2014-03-20 DIAGNOSIS — Z860100 Personal history of colon polyps, unspecified: Secondary | ICD-10-CM | POA: Insufficient documentation

## 2014-03-20 DIAGNOSIS — IMO0002 Reserved for concepts with insufficient information to code with codable children: Secondary | ICD-10-CM | POA: Insufficient documentation

## 2014-03-20 DIAGNOSIS — Z87891 Personal history of nicotine dependence: Secondary | ICD-10-CM | POA: Insufficient documentation

## 2014-03-20 DIAGNOSIS — J441 Chronic obstructive pulmonary disease with (acute) exacerbation: Secondary | ICD-10-CM | POA: Insufficient documentation

## 2014-03-20 DIAGNOSIS — I10 Essential (primary) hypertension: Secondary | ICD-10-CM | POA: Insufficient documentation

## 2014-03-20 DIAGNOSIS — E785 Hyperlipidemia, unspecified: Secondary | ICD-10-CM | POA: Insufficient documentation

## 2014-03-20 DIAGNOSIS — E7800 Pure hypercholesterolemia, unspecified: Secondary | ICD-10-CM | POA: Insufficient documentation

## 2014-03-20 DIAGNOSIS — Z79899 Other long term (current) drug therapy: Secondary | ICD-10-CM | POA: Insufficient documentation

## 2014-03-20 DIAGNOSIS — E119 Type 2 diabetes mellitus without complications: Secondary | ICD-10-CM | POA: Insufficient documentation

## 2014-03-20 DIAGNOSIS — Z794 Long term (current) use of insulin: Secondary | ICD-10-CM | POA: Insufficient documentation

## 2014-03-20 LAB — CBC WITH DIFFERENTIAL/PLATELET
Basophils Absolute: 0.1 10*3/uL (ref 0.0–0.1)
Basophils Relative: 1 % (ref 0–1)
EOS PCT: 6 % — AB (ref 0–5)
Eosinophils Absolute: 0.5 10*3/uL (ref 0.0–0.7)
HCT: 42.8 % (ref 39.0–52.0)
Hemoglobin: 14.2 g/dL (ref 13.0–17.0)
LYMPHS ABS: 0.9 10*3/uL (ref 0.7–4.0)
Lymphocytes Relative: 11 % — ABNORMAL LOW (ref 12–46)
MCH: 28.6 pg (ref 26.0–34.0)
MCHC: 33.2 g/dL (ref 30.0–36.0)
MCV: 86.3 fL (ref 78.0–100.0)
MONOS PCT: 8 % (ref 3–12)
Monocytes Absolute: 0.7 10*3/uL (ref 0.1–1.0)
Neutro Abs: 6.6 10*3/uL (ref 1.7–7.7)
Neutrophils Relative %: 76 % (ref 43–77)
PLATELETS: 286 10*3/uL (ref 150–400)
RBC: 4.96 MIL/uL (ref 4.22–5.81)
RDW: 13.2 % (ref 11.5–15.5)
WBC: 8.8 10*3/uL (ref 4.0–10.5)

## 2014-03-20 LAB — BASIC METABOLIC PANEL
BUN: 22 mg/dL (ref 6–23)
CALCIUM: 9.4 mg/dL (ref 8.4–10.5)
CO2: 25 mEq/L (ref 19–32)
Chloride: 94 mEq/L — ABNORMAL LOW (ref 96–112)
Creatinine, Ser: 0.92 mg/dL (ref 0.50–1.35)
GFR calc Af Amer: >90 mL/min (ref >90)
GFR, EST NON AFRICAN AMERICAN: 80 mL/min — AB (ref >90)
Glucose, Bld: 441 mg/dL — ABNORMAL HIGH (ref 70–99)
Potassium: 5.7 mEq/L — ABNORMAL HIGH (ref 3.7–5.3)
SODIUM: 133 meq/L — AB (ref 137–147)

## 2014-03-20 MED ORDER — INSULIN ASPART 100 UNIT/ML ~~LOC~~ SOLN
SUBCUTANEOUS | Status: AC
  Filled 2014-03-20: qty 1

## 2014-03-20 MED ORDER — IPRATROPIUM-ALBUTEROL 0.5-2.5 (3) MG/3ML IN SOLN
3.0000 mL | Freq: Once | RESPIRATORY_TRACT | Status: AC
  Administered 2014-03-20: 3 mL via RESPIRATORY_TRACT
  Filled 2014-03-20: qty 3

## 2014-03-20 MED ORDER — METHYLPREDNISOLONE SODIUM SUCC 125 MG IJ SOLR
125.0000 mg | Freq: Once | INTRAMUSCULAR | Status: AC
  Administered 2014-03-20: 125 mg via INTRAVENOUS
  Filled 2014-03-20: qty 2

## 2014-03-20 MED ORDER — INSULIN ASPART 100 UNIT/ML IV SOLN
10.0000 [IU] | Freq: Once | INTRAVENOUS | Status: AC
  Administered 2014-03-20: 10 [IU] via INTRAVENOUS

## 2014-03-20 MED ORDER — ALBUTEROL SULFATE (2.5 MG/3ML) 0.083% IN NEBU
2.5000 mg | INHALATION_SOLUTION | Freq: Once | RESPIRATORY_TRACT | Status: AC
  Administered 2014-03-20: 2.5 mg via RESPIRATORY_TRACT
  Filled 2014-03-20: qty 3

## 2014-03-20 MED ORDER — PREDNISONE 20 MG PO TABS: ORAL_TABLET | ORAL | Status: DC

## 2014-03-20 MED ORDER — AMOXICILLIN 500 MG PO CAPS: 500.0000 mg | ORAL_CAPSULE | Freq: Three times a day (TID) | ORAL | Status: DC

## 2014-03-20 NOTE — ED Provider Notes (Signed)
CSN: [id number]     Arrival date & time 03/20/14  1608 History   First MD Initiated Contact with Patient 03/20/14 1722    This chart was scribed for Maudry Diego, MD by Terressa Koyanagi, ED Scribe. This patient was seen in room APA17/APA17 and the patient's care was started at 5:32 PM.  Chief Complaint  Patient presents with  . Cough  . Shortness of Breath   Patient is a 79 y.o. male presenting with cough and shortness of breath. The history is provided by the patient. No language interpreter was used.  Cough Cough characteristics:  Productive Sputum characteristics:  White Severity:  Moderate Duration:  1 week Timing:  Intermittent Chronicity:  New Smoker: no (former smoker quit date 12/31/2012)   Associated symptoms: shortness of breath and wheezing   Associated symptoms: no chest pain, no chills, no eye discharge, no fever, no headaches and no rash   Shortness of breath:    Severity:  Moderate   Duration:  1 week Shortness of Breath Associated symptoms: cough (with shite sputum) and wheezing   Associated symptoms: no abdominal pain, no chest pain, no fever, no headaches and no rash    HPI Comments: Edward [name] is a 79 y.o. male, with an extensive medical Hx inlcuding DM, COPD, and HTN,  who presents to the Emergency Department complaining of SOB with associated productive cough with white sputum onset one week ago. Pt denies fever or chills. Pt is a former smoker who smoked 1.5 ppd for 60 years and a quit date of 12/31/12.   Past Medical History  Diagnosis Date  . Diabetes mellitus   . COPD (chronic obstructive pulmonary disease)   . Hypertension   . Allergy     Rhinitis  . Elevated lipids   . Pulmonary fibrosis   . Bronchitis   . Colon polyps   . Vitamin D deficiency   . PSA elevation   . Chronic respiratory failure   . Hypercholesterolemia    Past Surgical History  Procedure Laterality Date  . Cataract extraction w/phaco  06/25/2012    Procedure: CATARACT EXTRACTION  PHACO AND INTRAOCULAR LENS PLACEMENT (IOC);  Surgeon: Elta Guadeloupe T. Gershon Crane, MD;  Location: [facility];  Service: Ophthalmology;  Laterality: Left;  CDE=19.01  . Cataract extraction w/phaco  07/09/2012    Procedure: CATARACT EXTRACTION PHACO AND INTRAOCULAR LENS PLACEMENT (IOC);  Surgeon: Elta Guadeloupe T. Gershon Crane, MD;  Location: [facility];  Service: Ophthalmology;  Laterality: Right;  CDE: 20.09   Family History  Problem Relation Age of Onset  . CAD Other   . Diabetes Other   . Heart disease Mother    History  Substance Use Topics  . Smoking status: Former Smoker -- 1.50 packs/day for 60 years    Types: Cigarettes    Quit date: 12/31/2012  . Smokeless tobacco: Never Used  . Alcohol Use: No    Review of Systems  Constitutional: Negative for fever, chills, appetite change and fatigue.  HENT: Negative for congestion, ear discharge and sinus pressure.   Eyes: Negative for discharge.  Respiratory: Positive for cough (with shite sputum), shortness of breath and wheezing.   Cardiovascular: Negative for chest pain.  Gastrointestinal: Negative for abdominal pain and diarrhea.  Genitourinary: Negative for frequency and hematuria.  Musculoskeletal: Negative for back pain.  Skin: Negative for rash.  Neurological: Negative for seizures and headaches.  Psychiatric/Behavioral: Negative for hallucinations.      Allergies  Ace inhibitors  Home Medications   Prior to  Admission medications   Medication Sig Start Date End Date Taking? Authorizing Provider  albuterol ([name];VENTOLIN HFA) 108 (90 BASE) MCG/ACT inhaler Inhale 2 puffs into the lungs every 6 (six) hours as needed for wheezing or shortness of breath. 11/14/13   Lonie Peak Dixon, PA-C  albuterol (PROVENTIL) (2.5 MG/3ML) 0.083% nebulizer solution Take 2.5 mg by nebulization every 6 (six) hours as needed for wheezing or shortness of breath.     Historical Provider, MD  aspirin EC 81 MG tablet Take 81 mg by mouth daily.    Historical Provider, MD   budesonide-formoterol (SYMBICORT) 160-4.5 MCG/ACT inhaler Inhale 2 puffs into the lungs 2 (two) times daily.    Historical Provider, MD  Insulin Glargine (LANTUS) 100 UNIT/ML Solostar Pen Inject 18 Units into the skin at bedtime. 11/17/13   Kathie Dike, MD  losartan (COZAAR) 100 MG tablet TAKE ONE TABLET BY MOUTH ONCE DAILY 12/25/13   Orlena Sheldon, PA-C  metFORMIN (GLUCOPHAGE) 1000 MG tablet Take 1,000 mg by mouth 2 (two) times daily.    Historical Provider, MD  Omega-3 Fatty Acids (FISH OIL) 1000 MG CAPS Take 2 capsules by mouth daily.    Historical Provider, MD  ONE TOUCH ULTRA TEST test strip 1 each by Other route daily. 01/26/14   Historical Provider, MD  pravastatin (PRAVACHOL) 80 MG tablet TAKE ONE TABLET BY MOUTH ONCE DAILY AT BEDTIME 01/22/14   Orlena Sheldon, PA-C  tiotropium (SPIRIVA HANDIHALER) 18 MCG inhalation capsule Place 1 capsule (18 mcg total) into inhaler and inhale daily. 12/01/13   Orlena Sheldon, PA-C   Triage Vitals: BP 163/70  Pulse 112  Temp(Src) 97.9 F (36.6 C) (Oral)  Resp 22  Ht 5' (1.524 m)  Wt 155 lb (70.308 kg)  BMI 30.27 kg/m2  SpO2 91% Physical Exam  Constitutional: He is oriented to person, place, and time. He appears well-developed.  HENT:  Head: Normocephalic.  Eyes: Conjunctivae and EOM are normal. No scleral icterus.  Neck: Neck supple. No thyromegaly present.  Cardiovascular: Normal rate and regular rhythm.  Exam reveals no gallop and no friction rub.   No murmur heard. Pulmonary/Chest: No stridor. He has wheezes (diffused wheezing bilaterally ). He has no rales. He exhibits no tenderness.  Abdominal: He exhibits no distension. There is no tenderness. There is no rebound.  Musculoskeletal: Normal range of motion. He exhibits no edema.  Lymphadenopathy:    He has no cervical adenopathy.  Neurological: He is oriented to person, place, and time. He exhibits normal muscle tone. Coordination normal.  Skin: No rash noted. No erythema.  Psychiatric: He has  a normal mood and affect. His behavior is normal.    ED Course  Procedures (including critical care time) DIAGNOSTIC STUDIES: Oxygen Saturation is 91% on RA, low by my interpretation.    COORDINATION OF CARE: 5:34 PM-Discussed treatment plan which includes EKG, labs and imaging with pt at bedside and pt agreed to plan.   Labs Review Labs Reviewed  CBC WITH DIFFERENTIAL  BASIC METABOLIC PANEL    Imaging Review Dg Chest 2 View  03/20/2014   CLINICAL DATA:  Progressive shortness of breath for 2 days; history of COPD and diabetes and pulmonary fibrosis.  EXAM: CHEST  2 VIEW  COMPARISON:  Portable chest x-ray of November 15, 2013  FINDINGS: The lungs are mildly hyperinflated with hemidiaphragm flattening. The interstitial markings are slightly less conspicuous today than in the past. There is no alveolar infiltrate or pleural effusion. Nipple shadows are visible  bilaterally. The cardiac silhouette and mediastinum are normal. The bony thorax is unremarkable.  IMPRESSION: COPD. There is no pneumonia nor other acute cardiopulmonary disease.   Electronically Signed   By: David  Martinique   On: 03/20/2014 16:48     EKG Interpretation None      MDM   Final diagnoses:  None    The chart was scribed for me under my direct supervision.  I personally performed the history, physical, and medical decision making and all procedures in the evaluation of this patient.Maudry Diego, MD 03/20/14 [phone]

## 2014-03-20 NOTE — Discharge Instructions (Signed)
Follow up with your doctor next week to check your potassium,  Sugar and breathing

## 2014-03-20 NOTE — ED Notes (Signed)
Pt c/o cough and sob x 1 week.  Denies pain.

## 2014-03-26 ENCOUNTER — Encounter: Payer: Self-pay | Admitting: Physician Assistant

## 2014-03-26 ENCOUNTER — Ambulatory Visit (INDEPENDENT_AMBULATORY_CARE_PROVIDER_SITE_OTHER): Payer: Medicare Other | Admitting: Physician Assistant

## 2014-03-26 VITALS — BP 122/70 | HR 88 | Temp 98.1°F | Resp 20 | Wt 159.0 lb

## 2014-03-26 DIAGNOSIS — I10 Essential (primary) hypertension: Secondary | ICD-10-CM

## 2014-03-26 DIAGNOSIS — J441 Chronic obstructive pulmonary disease with (acute) exacerbation: Secondary | ICD-10-CM

## 2014-03-26 DIAGNOSIS — K635 Polyp of colon: Secondary | ICD-10-CM

## 2014-03-26 DIAGNOSIS — R972 Elevated prostate specific antigen [PSA]: Secondary | ICD-10-CM

## 2014-03-26 DIAGNOSIS — E785 Hyperlipidemia, unspecified: Secondary | ICD-10-CM

## 2014-03-26 DIAGNOSIS — J8410 Pulmonary fibrosis, unspecified: Secondary | ICD-10-CM

## 2014-03-26 DIAGNOSIS — D126 Benign neoplasm of colon, unspecified: Secondary | ICD-10-CM

## 2014-03-26 NOTE — Progress Notes (Signed)
Patient ID: Edward [name] MRN: [id number], DOB: 09/05/1935, 79 y.o. Date of Encounter: @DATE @  Chief Complaint:  Chief Complaint  Patient presents with  . follow up    back at ED, c/o alot of gastric reflux, hiccups, heartburn    HPI: 79 y.o. year old male  presents for routine followup office visit.   Last regular office visit with me just routine was 10/23/13. Then he had a followup office visit with me 10/30/13 at which time we stopped multiple oral medicines and started Lantus. On 11/04/13 he had an ER visit with COPD. On 11/06/13 he had another ER visit with COPD. 11/10/2013 he had office visit with me to followup with COPD. 11/15/13 he was hospitalized with COPD. 12/01/13 have followup office visit with me regarding his COPD. 12/08/13 he had another followup office visit with me.  I had placed him on Spiriva and Symbicort for his COPD.  LOV with me was 03/12/2014--At that OV he said  that he was no longer taking these secondary to the expense. Said that he can tell a difference. Could tell that his breathing was improved when he was on these medications compared to when  he is off of these medicines.  He is taking his diabetes medications as directed. Currently on Lantus 18 units. He did bring in BS log sheets with him today.  He only checks fasting blood sugars but has been checking them every morning. He has been documented for April May and June. All of his readings are very consistent. Almost all readings are ranging between 135-165. AT 03/12/2014 ASSESSMENT / PLAN WAS:  1. History of repeated noncompliance in the past secondary to finances.  In the past he frequently could not afford all of his oral diabetic medications. Today he reports he is unable to afford his Spiriva and Symbicort. Today I have provided him with samples of both Spiriva and Symbicort. As well I checked our samples but we currently have no samples of Lantus. [name] is going to Water quality scientist for [employer]  to see whether they can help provide some assistance to him.  2. history of smoking. He quit smoking January 2014. Congratulations and staying off of the cigarettes!!  3. COPD Status post ER visit for COPD on 11/04/13 Nondisplaced ER visit for COPD on 11/06/13 Status post hospitalization for COPD exacerbation 11/15/13 See HPI regarding Spiriva and Symbicort.     TODAY:  Since his last office visit with me on 03/12/14, he actually has had to go back to the emergency room. Went to the ER on 03/20/14, complaining of shortness of breath with associated productive cough with white sputum x1 week. Denied fevers and chills. Noted that he was a former smoker he smoked 1.5 packs per day for 60 years-- quit date 12/31/12. Chest x-ray showed COPD but no acute findings. No pneumonia. Patient was treated with nebulizer in the ER. Discharged home with prescription for amoxicillin 3 times a day. Also prednisone 40 mg daily for 3 days. Patient reports that his breathing is improved and back to baseline.   He also brings in his blood sugar log sheet. Back towards June 1st-- fasting blood sugars were ranging 145-165. They stayed in that range through June 10. After June 10 BS began rising into the 200s and even 300s. He says that he has continued the same dose of Lantus throughout that period. Sugar elevation must of been secondary to infection, COPD exacerbation and then secondary to prednisone prescribed at the  ER.  He has no active complaints today.  He states that the people with the assistance program have contacted him. He says that so far he has only seen them one time. He says that they came one day last week. Says that they came and looked through his medicines and filled out some papers. Says they called yesterday and said that they were coming today between 1 PM to 8 PM " with medicines." He does not know exactly what medications they are bringing. Says the only other information that they discussed was the  fact that his co pay for the medicine had been $59 and asked him whether he could pay $30 and he said yes.  I DO NOT KNOW IF THIS IS $ 30 TOTAL FOR ALL MEDS HE NEEDS OR IF THIS IS $ 30 PER BRANDED MEDICATION--???  He is taking his blood pressure medicines as directed. No adverse effects. In the past he had high potassium with ACE inhibitors and was changed to ARB.  Still taking pravastatin. No myalgias or other adverse effects.  He continues to stay off of his cigarettes.  He has no complaints today. Even with exertion he is having no angina symptoms.   Past Medical History  Diagnosis Date  . Diabetes mellitus   . COPD (chronic obstructive pulmonary disease)   . Hypertension   . Allergy     Rhinitis  . Elevated lipids   . Pulmonary fibrosis   . Bronchitis   . Colon polyps   . Vitamin D deficiency   . PSA elevation   . Chronic respiratory failure   . Hypercholesterolemia      Home Meds: Outpatient Prescriptions Prior to Visit  Medication Sig Dispense Refill  . albuterol ([name];VENTOLIN HFA) 108 (90 BASE) MCG/ACT inhaler Inhale 2 puffs into the lungs every 6 (six) hours as needed for wheezing or shortness of breath.  1 Inhaler  2  . albuterol (PROVENTIL) (2.5 MG/3ML) 0.083% nebulizer solution Take 2.5 mg by nebulization every 6 (six) hours as needed for wheezing or shortness of breath.       Marland Kitchen aspirin EC 81 MG tablet Take 81 mg by mouth every morning.       . budesonide-formoterol (SYMBICORT) 160-4.5 MCG/ACT inhaler Inhale 2 puffs into the lungs 2 (two) times daily.      . Insulin Glargine (LANTUS) 100 UNIT/ML Solostar Pen Inject 18 Units into the skin at bedtime.      Marland Kitchen loratadine (ALLERGY RELIEF) 10 MG tablet Take 10 mg by mouth at bedtime as needed for allergies.      Marland Kitchen losartan (COZAAR) 100 MG tablet Take 100 mg by mouth every morning.      . metFORMIN (GLUCOPHAGE) 1000 MG tablet Take 1,000 mg by mouth 2 (two) times daily.      . Omega-3 Fatty Acids (FISH OIL) 1000  MG CAPS Take 1 capsule by mouth 2 (two) times daily.       Marland Kitchen tiotropium (SPIRIVA HANDIHALER) 18 MCG inhalation capsule Place 1 capsule (18 mcg total) into inhaler and inhale daily.  30 capsule  12  . amoxicillin (AMOXIL) 500 MG capsule Take 1 capsule (500 mg total) by mouth 3 (three) times daily.  21 capsule  0  . predniSONE (DELTASONE) 20 MG tablet 2 tabs po daily x 3 days  6 tablet  0   No facility-administered medications prior to visit.     Allergies:  Allergies  Allergen Reactions  . Ace Inhibitors  Hyperkalemia--07/23/2013    History   Social History  . Marital Status: Married    Spouse Name: N/A    Number of Children: N/A  . Years of Education: N/A   Occupational History  . 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 Smoking status: Former Smoker -- 1.50 packs/day for 60 years    Types: Cigarettes    Quit date: 12/31/2012  . Smokeless tobacco: Never Used  . Alcohol Use: No  . Drug Use: No  . Sexual Activity: Yes    Birth Control/ Protection: None   Other Topics Concern  . Not on file   Social History Narrative  . No narrative on file    Family History  Problem Relation Age of Onset  . CAD Other   . Diabetes Other   . Heart disease Mother      Review of Systems:  See HPI for pertinent ROS. All other ROS negative.    Physical Exam: Blood pressure 122/70, pulse 88, temperature 98.1 F (36.7 C), temperature source Oral, resp. rate 20, weight 159 lb (72.122 kg)., Body mass index is 31.05 kg/(m^2). General: WNWD WM, "Weathered Appearance" Appears in no acute distress. Neck: Supple. No thyromegaly. No lymphadenopathy. No carotid bruits. Lungs: Clear bilaterally to auscultation without wheezes, rales, or rhonchi. Breathing is unlabored. Heart: RRR with S1 S2. No murmurs, rubs, or gallops. Abdomen: Soft, non-tender, non-distended with normoactive bowel sounds. No hepatomegaly. No rebound/guarding. No obvious abdominal  masses. Musculoskeletal:  Strength and tone normal for age. Extremities/Skin: Warm and dry. No clubbing or cyanosis. No edema. No rashes or suspicious lesions. Neuro: Alert and oriented X 3. Moves all extremities spontaneously. Gait is normal. CNII-XII grossly in tact. Psych:  Responds to questions appropriately with a normal affect. Diabetic foot exam: Flexion is normal. No Winzer concerning lesions. Sensation is intact. On the right: 2+ dorsalis pedis 2+ PT. On the left 1+ DP. 2+ PT.      ASSESSMENT AND PLAN:  79 y.o. year old male with   1. History of repeated noncompliance in the past secondary to finances.  In the past he frequently could not afford all of his oral diabetic medications. At [place] 03/12/14-- he reports he is unable to afford his Spiriva and Symbicort.  -- I have provided him with samples of both Spiriva and Symbicort.  --As well I checked our samples but we currently have no samples of Lantus.  --[name] is going to Water quality scientist for [employer] to see whether they can help provide some assistance to him. [employer] for [employer] is working with him.-- See history of present illness. Today I have given him samples of the following 2 boxes of Levemir. I explained to him that this is the same as Lantus and today's is the same as his Lantus. 2 boxes of Symbicort 1 box of Spariva  2. history of smoking. He quit smoking January 2014. Congratulations and staying off of the cigarettes!!  3. COPD Status post ER visit for COPD on 11/04/13 Nondisplaced ER visit for COPD on 11/06/13 Status post hospitalization for COPD exacerbation 11/15/13 S/P ER visit 03/20/14 for COPD exacerbation See HPI and # 1 above  regarding Spiriva and Symbicort.   3. Diabetes He is currently on Lantus 18 units daily. He did bring in her blood sugar along with fasting blood sugars every morning and they look good. See history of present illness.  - Hemoglobin A1c  Microalbumin 04/21/13 I have  discussed need for routine eye exams and dental exams but he hasn't been able to followup with the secondary to finances. On ARB. (H/O hyperkalemia with ACE Inh) On statin.  2. Hypertension At Lab 07/23/13 his potassium was even higher. It had been borderline high prior to this. At that time his ACE inhibitor was stopped and he was started on ARB. - Last BMET here 03/12/14  3. Elevated lipids Lipids were at goal and excellent at Lab 07/23/13. LFTs normal. He is on pravastatin.   5. Pulmonary fibrosis  6. Elevated PSA He had elevated PSA at Lab 07/2012. Also prior to this PSA was elevated. He has been informed this could be secondary to prostate cancer but refused followup with urology. He still refuses to recheck PSA. He is aware that he could have prostate cancer but does not want further evaluation or followup with his AT ALL.  7. Colon polyps History of Hemoccult-positive stool x3 in 07/2008. Refused GI evaluation despite being informed of risk of severe GI bleed, cancer, et Ronney Asters. H./H. normal 03/2010 and 01/2011. Refuses colonoscopy.  8. Immunizations: We have discussed influenza vaccine earlier this season and he is deferred. Pneumonia vaccine: He received Pneumovax June 2003.  Received  Prevnar 13  10/23/2013 .  Tetanus: Gave in September 2005. Not covered by Medicare --therefore has deferred f/u sec to cost.   He is going to schedule followup office visit in one month so that we can followup to make sure he is followed up with [facility] community or some type of assistance. Have given him more samples today as listed above.  [facility] to see him this afternoon.   Marin Olp [place], [place], [facility] 03/26/2014 8:18 AM

## 2014-04-09 ENCOUNTER — Ambulatory Visit: Payer: Medicare Other | Admitting: Physician Assistant

## 2014-04-22 ENCOUNTER — Telehealth: Payer: Self-pay | Admitting: Physician Assistant

## 2014-04-22 NOTE — Telephone Encounter (Signed)
Called pt to schedule [facility] and CPE and he declined

## 2014-05-12 ENCOUNTER — Encounter: Payer: Self-pay | Admitting: Family Medicine

## 2014-05-12 ENCOUNTER — Ambulatory Visit (INDEPENDENT_AMBULATORY_CARE_PROVIDER_SITE_OTHER): Payer: Medicare Other | Admitting: Family Medicine

## 2014-05-12 VITALS — BP 138/78 | HR 78 | Temp 98.3°F | Resp 16 | Ht 65.0 in | Wt 161.0 lb

## 2014-05-12 DIAGNOSIS — R945 Abnormal results of liver function studies: Secondary | ICD-10-CM

## 2014-05-12 DIAGNOSIS — E119 Type 2 diabetes mellitus without complications: Secondary | ICD-10-CM

## 2014-05-12 DIAGNOSIS — J4489 Other specified chronic obstructive pulmonary disease: Secondary | ICD-10-CM

## 2014-05-12 DIAGNOSIS — E7800 Pure hypercholesterolemia, unspecified: Secondary | ICD-10-CM | POA: Insufficient documentation

## 2014-05-12 DIAGNOSIS — I10 Essential (primary) hypertension: Secondary | ICD-10-CM

## 2014-05-12 DIAGNOSIS — E785 Hyperlipidemia, unspecified: Secondary | ICD-10-CM

## 2014-05-12 DIAGNOSIS — S91319A Laceration without foreign body, unspecified foot, initial encounter: Secondary | ICD-10-CM | POA: Insufficient documentation

## 2014-05-12 DIAGNOSIS — S91309A Unspecified open wound, unspecified foot, initial encounter: Secondary | ICD-10-CM

## 2014-05-12 DIAGNOSIS — S91312A Laceration without foreign body, left foot, initial encounter: Secondary | ICD-10-CM

## 2014-05-12 DIAGNOSIS — R7989 Other specified abnormal findings of blood chemistry: Secondary | ICD-10-CM | POA: Insufficient documentation

## 2014-05-12 DIAGNOSIS — J449 Chronic obstructive pulmonary disease, unspecified: Secondary | ICD-10-CM

## 2014-05-12 LAB — MICROALBUMIN / CREATININE URINE RATIO
CREATININE, URINE: 68 mg/dL
MICROALB UR: 13.65 mg/dL — AB (ref 0.00–1.89)
MICROALB/CREAT RATIO: 200.7 mg/g — AB (ref 0.0–30.0)

## 2014-05-12 MED ORDER — INSULIN DETEMIR 100 UNIT/ML FLEXPEN: PEN_INJECTOR | SUBCUTANEOUS | Status: DC

## 2014-05-12 NOTE — Assessment & Plan Note (Addendum)
His liver function were elevated 2 months ago. He has known hyperlipidemia he also has untreated prostate cancer and he declines colonoscopy therefore this could possibly be due to a transient elevation versus infection versus side effect of his statin drug versus other underlying malignancy. The first thing we'll do is repeat the liver function tests   Note I sent patient home before realizing did not draw his blood I called his wife back to have him come back in either this afternoon or in the morning to have her repeat liver function test and kidney function done

## 2014-05-12 NOTE — Assessment & Plan Note (Signed)
Lesion inspected on his left foot. He states that he injured it and he has been dressing at home. He's not had any drainage from it. He is diabetic which is uncontrolled. Looks more like circular-like laceration which is now healing there is some mild erythema at the edges we will continue to monitor this

## 2014-05-12 NOTE — Assessment & Plan Note (Signed)
Diabetes is uncontrolled. I switched him to Levemir as we do have his hearing office he'll take 20 units at bedtime he will continue the metformin. We will recheck his renal function with GFR urine micro-was also done

## 2014-05-12 NOTE — Assessment & Plan Note (Signed)
Plan to recheck lipids in 1 month,

## 2014-05-12 NOTE — Progress Notes (Addendum)
Patient ID: Edward [name], male   DOB: 07-21-1935, 79 y.o.   MRN: [id number]   Subjective:    Patient ID: Edward [name], male    DOB: [birth year], 79 y.o.   MRN: [id number]  Patient presents for 2 month F/U  Patient here to followup chronic medical problems he is uncontrolled diabetes mellitus with A1c of 11.9% 2 months ago he is supposed to be on 18 units of Lantus however he ran out of his insulin about a week or so ago. Keeping his medications has been difficult. His blood sugars fasting have been 101-240 he did have an outlier at 300 he does not typically take his blood sugar in the evening but when he did his blood sugars were 160 to 400s 2 hours postprandial. He's not had any hypoglycemia. He denies polyuria polydipsia. He has been taking the metformin. His last set of labs showed K of 5.7 his LFT were also elevated   COPD- He wears 2L of oxygen as needed and at bedtime, he has been out of his spiriva and his symbicort, states pharmacy will not deliver because of copay.  He has been using the albuterol as needed, he has had some mild congestion this week, no change in breathing or cough  Medications reviewed, he is only taking pravastatin, losartan and metformin     Review Of Systems:  GEN- denies fatigue, fever, weight loss,weakness, recent illness HEENT- denies eye drainage, change in vision, nasal discharge, CVS- denies chest pain, palpitations RESP- denies SOB, cough, wheeze ABD- denies N/V, change in stools, abd pain GU- denies dysuria, hematuria, dribbling, incontinence MSK- denies joint pain, muscle aches, injury Neuro- denies headache, dizziness, syncope, seizure activity       Objective:    BP 138/78  Pulse 78  Temp(Src) 98.3 F (36.8 C) (Oral)  Resp 16  Ht 5\' 5"  (1.651 m)  Wt 161 lb (73.029 kg)  BMI 26.79 kg/m2  SpO2 92% GEN- NAD, alert and oriented x3 HEENT- PERRL, EOMI, non injected sclera, pink conjunctiva, MMM, oropharynx clear Neck- Supple, LAD CVS-  RRR, no murmur RESP-few scattered wheeze, normal WOB, no rhonchi ABD-NABS,soft,NT,ND EXT- No edema Pulses- Radial, DP- 2+  Left dorsum- circular laceration- thick scab in center mild erythema surrounding,NT, no drainage      Assessment & Plan:      Problem List Items Addressed This Visit   Diabetes - Primary   Relevant Medications      pravastatin (PRAVACHOL) 80 MG tablet      Insulin Detemir (LEVEMIR FLEXTOUCH) 100 UNIT/ML Pen   Other Relevant Orders      Microalbumin / creatinine urine ratio   COPD (chronic obstructive pulmonary disease)      Note: This dictation was prepared with Dragon dictation along with smaller phrase technology. Any transcriptional errors that result from this process are unintentional.

## 2014-05-12 NOTE — Patient Instructions (Signed)
Inject Levemir 20units at bedtime Spriva and Symbicort  [facility] for labs

## 2014-05-12 NOTE — Assessment & Plan Note (Signed)
Well controlled 

## 2014-05-12 NOTE — Assessment & Plan Note (Signed)
He has a mild scattered wheeze today have given him Mucinex DM to use I do not think he is in acute exacerbation he will continue his albuterol up also given him samples of Symbicort and Spiriva here in the office will followup with physicians pharmacy only see if he is a patient there is due to some confusion about where his medications are coming from, our other option is to get him involved with [facility] in their pharmacy assistance per

## 2014-05-13 ENCOUNTER — Telehealth: Payer: Self-pay | Admitting: *Deleted

## 2014-05-13 NOTE — Telephone Encounter (Signed)
Linda from [facility] called in reference to this pt stating about his medication and the let you know that he is being noncompliant, she spoke to pt and he states that he knows nothing about referral to them for help with medication, pt is declining their services. Also, wanted to let you know that she has tried to contact [facility] and he is not eligible d/t his insurance, Vaughan Basta says she will keep referral open until end of month and then close it out.

## 2014-05-18 ENCOUNTER — Other Ambulatory Visit: Payer: Medicare Other

## 2014-05-18 DIAGNOSIS — E119 Type 2 diabetes mellitus without complications: Secondary | ICD-10-CM

## 2014-05-18 DIAGNOSIS — R945 Abnormal results of liver function studies: Principal | ICD-10-CM

## 2014-05-18 DIAGNOSIS — E875 Hyperkalemia: Secondary | ICD-10-CM

## 2014-05-18 DIAGNOSIS — R7989 Other specified abnormal findings of blood chemistry: Secondary | ICD-10-CM

## 2014-05-18 LAB — BASIC METABOLIC PANEL
BUN: 15 mg/dL (ref 6–23)
CO2: 27 meq/L (ref 19–32)
Calcium: 9 mg/dL (ref 8.4–10.5)
Chloride: 99 mEq/L (ref 96–112)
Creat: 0.99 mg/dL (ref 0.50–1.35)
GLUCOSE: 178 mg/dL — AB (ref 70–99)
POTASSIUM: 4.8 meq/L (ref 3.5–5.3)
Sodium: 135 mEq/L (ref 135–145)

## 2014-05-18 LAB — HEPATIC FUNCTION PANEL
ALT: 11 U/L (ref 0–53)
AST: 11 U/L (ref 0–37)
Albumin: 3.9 g/dL (ref 3.5–5.2)
Alkaline Phosphatase: 59 U/L (ref 39–117)
BILIRUBIN DIRECT: 0.1 mg/dL (ref 0.0–0.3)
BILIRUBIN TOTAL: 0.3 mg/dL (ref 0.2–1.2)
Indirect Bilirubin: 0.2 mg/dL (ref 0.2–1.2)
Total Protein: 6.2 g/dL (ref 6.0–8.3)

## 2014-05-20 LAB — CBC WITH DIFFERENTIAL/PLATELET
Basophils Absolute: 0.1 10*3/uL (ref 0.0–0.1)
Basophils Relative: 1 % (ref 0–1)
EOS PCT: 8 % — AB (ref 0–5)
Eosinophils Absolute: 0.5 10*3/uL (ref 0.0–0.7)
HEMATOCRIT: 42.8 % (ref 39.0–52.0)
Hemoglobin: 14.1 g/dL (ref 13.0–17.0)
LYMPHS PCT: 29 % (ref 12–46)
Lymphs Abs: 1.7 10*3/uL (ref 0.7–4.0)
MCH: 27.3 pg (ref 26.0–34.0)
MCHC: 32.9 g/dL (ref 30.0–36.0)
MCV: 82.9 fL (ref 78.0–100.0)
MONO ABS: 0.5 10*3/uL (ref 0.1–1.0)
Monocytes Relative: 9 % (ref 3–12)
Neutro Abs: 3.1 10*3/uL (ref 1.7–7.7)
Neutrophils Relative %: 53 % (ref 43–77)
Platelets: 308 10*3/uL (ref 150–400)
RBC: 5.16 MIL/uL (ref 4.22–5.81)
RDW: 15.2 % (ref 11.5–15.5)
WBC: 5.9 10*3/uL (ref 4.0–10.5)

## 2014-06-03 ENCOUNTER — Telehealth: Payer: Self-pay | Admitting: Physician Assistant

## 2014-06-03 NOTE — Telephone Encounter (Signed)
Pt aware and appt made.

## 2014-06-03 NOTE — Telephone Encounter (Signed)
[phone]  Pt is needing something called in for his cough, pts wife states he can't stop coughing [facility]

## 2014-06-03 NOTE — Telephone Encounter (Signed)
He needs to schedule an office visit and needs to be seen. He has had LOTS of COPD exacerbations in the past year. Has been treated here for COPD exacerbations and also at the ER for COPD exacerbations MANY times.

## 2014-06-04 ENCOUNTER — Encounter: Payer: Self-pay | Admitting: Physician Assistant

## 2014-06-04 ENCOUNTER — Ambulatory Visit (INDEPENDENT_AMBULATORY_CARE_PROVIDER_SITE_OTHER): Payer: Medicare Other | Admitting: Physician Assistant

## 2014-06-04 VITALS — BP 136/68 | HR 104 | Temp 98.0°F | Resp 24 | Ht 65.0 in | Wt 164.0 lb

## 2014-06-04 DIAGNOSIS — J441 Chronic obstructive pulmonary disease with (acute) exacerbation: Secondary | ICD-10-CM

## 2014-06-04 NOTE — Progress Notes (Signed)
Patient ID: ASANTE BLANDA MRN: [id number], DOB: [birth year], 79 y.o. Date of Encounter: 06/04/2014, 1:50 PM    Chief Complaint:  Chief Complaint  Patient presents with  . Cough/ SOB    x3 weeks     HPI: 79 y.o. year old male says for the past 2-3 weeks his breathing has gotten worse. Has had some cough. However getting out no phlegm. Has had no fever.  Says he is out of his "breathing medicine"--asked him what medicine he is referring to. I specifically listed out the Symbicort and Spiriva and he says that he has been out of those for a while now.  We had [facility] follow up with him to see if they can provide any assistance--  because he has had noncompliance secondary to finances. However we just got a note from the [facility] nurse this week stating that they had closed out his case because he told them that he did not want/ need their assistance.  He tells me that he'd  "be better off going down to [facility] and get his medicines like he was before." Says that even with the [facility] each medicine was $40 and the medicines they were helping him with was going to cost total of $ 150 . Says  he is "about to be out of his insulin pen too."     Home Meds:   Outpatient Prescriptions Prior to Visit  Medication Sig Dispense Refill  . albuterol ([name];VENTOLIN HFA) 108 (90 BASE) MCG/ACT inhaler Inhale 2 puffs into the lungs every 6 (six) hours as needed for wheezing or shortness of breath.  1 Inhaler  2  . albuterol (PROVENTIL) (2.5 MG/3ML) 0.083% nebulizer solution Take 2.5 mg by nebulization every 6 (six) hours as needed for wheezing or shortness of breath.       Marland Kitchen aspirin EC 81 MG tablet Take 81 mg by mouth every morning.       . Blood Glucose Monitoring Suppl (ONE TOUCH ULTRA 2) W/DEVICE KIT       . budesonide-formoterol (SYMBICORT) 160-4.5 MCG/ACT inhaler Inhale 2 puffs into the lungs 2 (two) times daily.      . Insulin Detemir (LEVEMIR FLEXTOUCH) 100 UNIT/ML Pen Inject 20 units at bedtime  15  mL  11  . loratadine (ALLERGY RELIEF) 10 MG tablet Take 10 mg by mouth at bedtime as needed for allergies.      Marland Kitchen losartan (COZAAR) 100 MG tablet Take 100 mg by mouth every morning.      . metFORMIN (GLUCOPHAGE) 1000 MG tablet Take 1,000 mg by mouth 2 (two) times daily.      . Omega-3 Fatty Acids (FISH OIL) 1000 MG CAPS Take 1 capsule by mouth 2 (two) times daily.       . ONE TOUCH ULTRA TEST test strip       . ONETOUCH DELICA LANCETS 80S MISC       . pravastatin (PRAVACHOL) 80 MG tablet       . tiotropium (SPIRIVA HANDIHALER) 18 MCG inhalation capsule Place 1 capsule (18 mcg total) into inhaler and inhale daily.  30 capsule  12   No facility-administered medications prior to visit.    Allergies:  Allergies  Allergen Reactions  . Ace Inhibitors     Hyperkalemia--07/23/2013      Review of Systems: See HPI for pertinent ROS. All other ROS negative.    Physical Exam: Blood pressure 136/68, pulse 104, temperature 98 F (36.7 C), temperature source Oral, resp.  rate 24, height $RemoveBe'5\' 5"'[id number]$  (1.651 m), weight 164 lb (74.39 kg), SpO2 89.00%., Body mass index is 27.29 kg/(m^2). General: WM.  Appears in no acute distress. Neck: Supple. No thyromegaly. No lymphadenopathy. Lungs: He has distant breath sounds and decreased air movement throughout. However very minimal wheezing and no rhonchi. Heart: Regular rhythm. No murmurs, rubs, or gallops. Msk:  Strength and tone normal for age. Extremities/Skin: Warm and dry.  Neuro: Alert and oriented X 3. Moves all extremities spontaneously. Gait is normal. CNII-XII grossly in tact. Psych:  Responds to questions appropriately with a normal affect.     ASSESSMENT AND PLAN:  79 y.o. year old male with  1. COPD exacerbation I do not think he needs any prednisone or antibiotics right now. I think he simply needs to get back on  Symbicort and Spiriva.   Today I have given him 2 samples of Symbicort and 2 sample boxes of Spiriva. As well I have given him  several boxes of Levemir Pens. Told him to start using the Symbicort and Spiriva as directed. Told him to follow up with me if he feels that his breathing is not improving.   Signed, [address] [place], [place], [facility] 06/04/2014 1:50 PM

## 2014-06-16 ENCOUNTER — Ambulatory Visit: Payer: Medicare Other | Admitting: Family Medicine

## 2014-06-17 ENCOUNTER — Telehealth: Payer: Self-pay | Admitting: Family Medicine

## 2014-06-17 NOTE — Telephone Encounter (Signed)
06/02/14 received letter from [facility] that pt has refused their care management services.

## 2014-08-17 ENCOUNTER — Encounter: Payer: Self-pay | Admitting: Physician Assistant

## 2014-08-17 ENCOUNTER — Ambulatory Visit (INDEPENDENT_AMBULATORY_CARE_PROVIDER_SITE_OTHER): Payer: Medicare Other | Admitting: Physician Assistant

## 2014-08-17 VITALS — BP 128/80 | HR 80 | Temp 97.9°F | Resp 18 | Wt 162.0 lb

## 2014-08-17 DIAGNOSIS — R972 Elevated prostate specific antigen [PSA]: Secondary | ICD-10-CM

## 2014-08-17 DIAGNOSIS — K635 Polyp of colon: Secondary | ICD-10-CM

## 2014-08-17 DIAGNOSIS — J439 Emphysema, unspecified: Secondary | ICD-10-CM

## 2014-08-17 DIAGNOSIS — E1165 Type 2 diabetes mellitus with hyperglycemia: Secondary | ICD-10-CM

## 2014-08-17 DIAGNOSIS — I10 Essential (primary) hypertension: Secondary | ICD-10-CM

## 2014-08-17 DIAGNOSIS — E785 Hyperlipidemia, unspecified: Secondary | ICD-10-CM

## 2014-08-17 LAB — COMPLETE METABOLIC PANEL WITH GFR
ALT: 15 U/L (ref 0–53)
AST: 13 U/L (ref 0–37)
Albumin: 4.2 g/dL (ref 3.5–5.2)
Alkaline Phosphatase: 71 U/L (ref 39–117)
BUN: 14 mg/dL (ref 6–23)
CALCIUM: 9.7 mg/dL (ref 8.4–10.5)
CHLORIDE: 97 meq/L (ref 96–112)
CO2: 29 meq/L (ref 19–32)
Creat: 0.99 mg/dL (ref 0.50–1.35)
GFR, EST AFRICAN AMERICAN: 85 mL/min
GFR, EST NON AFRICAN AMERICAN: 73 mL/min
GLUCOSE: 205 mg/dL — AB (ref 70–99)
Potassium: 5 mEq/L (ref 3.5–5.3)
Sodium: 136 mEq/L (ref 135–145)
Total Bilirubin: 0.4 mg/dL (ref 0.2–1.2)
Total Protein: 6.8 g/dL (ref 6.0–8.3)

## 2014-08-17 LAB — LIPID PANEL
CHOLESTEROL: 147 mg/dL (ref 0–200)
HDL: 45 mg/dL (ref >39)
LDL Cholesterol: 65 mg/dL (ref 0–99)
TRIGLYCERIDES: 186 mg/dL — AB (ref <150)
Total CHOL/HDL Ratio: 3.3 Ratio
VLDL: 37 mg/dL (ref 0–40)

## 2014-08-17 LAB — HEMOGLOBIN A1C
Hgb A1c MFr Bld: 11.7 % — ABNORMAL HIGH (ref <5.7)
Mean Plasma Glucose: 289 mg/dL — ABNORMAL HIGH (ref <117)

## 2014-08-17 NOTE — Progress Notes (Signed)
Patient ID: Edward [name] MRN: [id number], DOB: 11/19/1934, 79 y.o. Date of Encounter: $[email or web address]$ @  Chief Complaint:  Chief Complaint  Patient presents with  . 3 mth check up    is fasting, refill neb meds    HPI: 79 y.o. year old male  presents for routine followup office visit.   Last regular office visit with me just routine was 10/23/13. Then he had a followup office visit with me 10/30/13 at which time we stopped multiple oral medicines and started Lantus. On 11/04/13 he had an ER visit with COPD. On 11/06/13 he had another ER visit with COPD. 11/10/2013 he had office visit with me to followup with COPD. 11/15/13 he was hospitalized with COPD. 12/01/13 have followup office visit with me regarding his COPD. 12/08/13 he had another followup office visit with me.  I had placed him on Spiriva and Symbicort for his COPD. Today he says that he is no longer taking these secondary to the expense. Says that he can tell a difference. Can tell that his breathing was improved when he was on these medications compared to when  he is off of these medicines. His last ROV was with Dr. Buelah Manis 05/12/14--at that visit she gave him more samples of Spiriva and Symbicort. However he says that he is back out of these again now. We did have [facility] visit him several months ago he was not compliant with following up with them and letting them help him.  He is taking his diabetes medications as directed. Currently on Levemir --20 units QHS. He did bring in BS log sheets with him today.  He only checks fasting blood sugars but has been checking them every morning.  He has 2 readings that are 91 and 96. All other readings are between 149 and 169.  He is taking his blood pressure medicines as directed. No adverse effects. In the past he had high potassium with ACE inhibitors and was changed to ARB.  03/12/14 labs showed elevated LFTs. At that time I said to put the pravastatin on hold and recheck LFTs in 2 weeks. He has had  follow-up LFTs the last of which were on 05/18/2014 and they were normal. Today when I asked him about the pravastatin being on hold--- he doesn't remember anything about this--- and hands me a list of medicines which he knows he is currently taking and this does include pravastatin. He says he is taking it if it is on this list----Still taking pravastatin.   He continues to stay off of his cigarettes.  He has no complaints today. Even with exertion he is having no angina symptoms.  At his visit with Dr. Buelah Manis 05/12/14 he had an area on his foot that she was going to monitor. He says that this has resolved. Today he is taking off his shoes and socks for me to reinspect.   Past Medical History  Diagnosis Date  . Diabetes mellitus   . COPD (chronic obstructive pulmonary disease)   . Hypertension   . Allergy     Rhinitis  . Elevated lipids   . Pulmonary fibrosis   . Bronchitis   . Colon polyps   . Vitamin D deficiency   . PSA elevation   . Chronic respiratory failure   . Hypercholesterolemia      Home Meds: Outpatient Prescriptions Prior to Visit  Medication Sig Dispense Refill  . albuterol ([name];VENTOLIN HFA) 108 (90 BASE) MCG/ACT inhaler Inhale 2 puffs into the lungs  every 6 (six) hours as needed for wheezing or shortness of breath. 1 Inhaler 2  . albuterol (PROVENTIL) (2.5 MG/3ML) 0.083% nebulizer solution Take 2.5 mg by nebulization every 6 (six) hours as needed for wheezing or shortness of breath.     Marland Kitchen aspirin EC 81 MG tablet Take 81 mg by mouth every morning.     . Blood Glucose Monitoring Suppl (ONE TOUCH ULTRA 2) W/DEVICE KIT     . budesonide-formoterol (SYMBICORT) 160-4.5 MCG/ACT inhaler Inhale 2 puffs into the lungs 2 (two) times daily.    . Insulin Detemir (LEVEMIR FLEXTOUCH) 100 UNIT/ML Pen Inject 20 units at bedtime 15 mL 11  . loratadine (ALLERGY RELIEF) 10 MG tablet Take 10 mg by mouth at bedtime as needed for allergies.    Marland Kitchen losartan (COZAAR) 100 MG tablet  Take 100 mg by mouth every morning.    . metFORMIN (GLUCOPHAGE) 1000 MG tablet Take 1,000 mg by mouth 2 (two) times daily.    . Omega-3 Fatty Acids (FISH OIL) 1000 MG CAPS Take 1 capsule by mouth 2 (two) times daily.     . ONE TOUCH ULTRA TEST test strip     . ONETOUCH DELICA LANCETS 55D MISC     . pravastatin (PRAVACHOL) 80 MG tablet     . tiotropium (SPIRIVA HANDIHALER) 18 MCG inhalation capsule Place 1 capsule (18 mcg total) into inhaler and inhale daily. 30 capsule 12   No facility-administered medications prior to visit.     Allergies:  Allergies  Allergen Reactions  . Ace Inhibitors     Hyperkalemia--07/23/2013    History   Social History  . Marital Status: Married    Spouse Name: N/A    Number of Children: N/A  . Years of Education: N/A   Occupational History  . 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 Smoking status: Former Smoker -- 1.50 packs/day for 60 years    Types: Cigarettes    Quit date: 12/31/2012  . Smokeless tobacco: Never Used  . Alcohol Use: No  . Drug Use: No  . Sexual Activity: Yes    Birth Control/ Protection: None   Other Topics Concern  . Not on file   Social History Narrative    Family History  Problem Relation Age of Onset  . CAD Other   . Diabetes Other   . Heart disease Mother      Review of Systems:  See HPI for pertinent ROS. All other ROS negative.    Physical Exam: Blood pressure 128/80, pulse 80, temperature 97.9 F (36.6 C), temperature source Oral, resp. rate 18, weight 162 lb (73.483 kg)., Body mass index is 26.96 kg/(m^2). General: WNWD WM, "Weathered Appearance" Appears in no acute distress. Neck: Supple. No thyromegaly. No lymphadenopathy. No carotid bruits. Lungs: Breathing is unlabored. Breath sounds are very distant and decreased but very little wheeze and clear. Heart: RRR with S1 S2. No murmurs, rubs, or gallops. Abdomen: Soft, non-tender, non-distended with normoactive bowel sounds. No  hepatomegaly. No rebound/guarding. No obvious abdominal masses. Musculoskeletal:  Strength and tone normal for age. Extremities/Skin: Warm and dry. No edema.  Neuro: Alert and oriented X 3. Moves all extremities spontaneously. Gait is normal. CNII-XII grossly in tact. Psych:  Responds to questions appropriately with a normal affect. Diabetic foot exam: Inspection is normal. No wounds or concerning lesions. Sensation is intact. On the right: 2+ dorsalis pedis 2+ PT. On the left 1+ DP. 2+ PT.  ASSESSMENT AND PLAN:  79 y.o. year old male with   1. History of repeated noncompliance in the past secondary to finances.  In the past he frequently could not afford all of his oral diabetic medications. Today he reports he is unable to afford his Spiriva and Symbicort. We have been providing him with samples of both Spiriva and Symbicort. As well, we have been providing him with samples of Lantus or Levemir as often as possible. At his visit with me 03/12/14 I had [name] to contact [facility] [employer] to see whether they can help provide some assistance to him. [facility] follow-up with him but he did not want their assistance  2. history of smoking. He quit smoking January 2014. Congratulations and staying off of the cigarettes!!  3. COPD Status post ER visit for COPD on 11/04/13 Another ER visit for COPD on 11/06/13 Status post hospitalization for COPD exacerbation 11/15/13 See HPI regarding Spiriva and Symbicort.   3. Diabetes He is currently on Levemir 20 units QHS He did bring in her blood sugar along with fasting blood sugars every morning and they look good. See history of present illness.  - Hemoglobin A1c  Microalbumin 05/12/14 I have discussed need for routine eye exams and dental exams but he hasn't been able to followup with the secondary to finances. On ARB. (H/O hyperkalemia with ACE Inh) On statin.  2. Hypertension At Lab 07/23/13 his potassium was even higher. It had been  borderline high prior to this. At that time his ACE inhibitor was stopped and he was started on ARB. - BASIC METABOLIC PANEL WITH GFR  3. Elevated lipids See HPI regarding elevated LFTs 03/12/2014 and Pravastatin--was piut on hold--but today patient states that he is currently taking this---see HPI--- he did have repeat LFTs 05/18/14 which were normal at that time. However I do not know for sure whether he was taking the pravastatin at that time or not. We'll recheck FLP and LFTs now.   5. Pulmonary fibrosis  6. Elevated PSA He had elevated PSA at Lab 07/2012. Also prior to this PSA was elevated. He has been informed this could be secondary to prostate cancer but refused followup with urology. He still refuses to recheck PSA. He is aware that he could have prostate cancer but does not want further evaluation or followup with his AT ALL.  7. Colon polyps History of Hemoccult-positive stool x3 in 07/2008. Refused GI evaluation despite being informed of risk of severe GI bleed, cancer, et Ronney Asters. H./H. normal 03/2010 and 01/2011. Refuses colonoscopy.  8. Immunizations: We have discussed influenza vaccine earlier this season and he is deferred. Pneumonia vaccine: He received Pneumovax June 2003.  Received  Prevnar 13  10/23/2013 .  Tetanus: Gave in September 2005. Not covered by Medicare --therefore has deferred f/u sec to cost.   At his OV with me 03/12/14---he was going to schedule followup office visit in one month so that we can followup to make sure he is followed up with [facility] or some type of assistance. Have given him enough Symbicort samples to last until then. I gave him 2 boxes of Spiriva, which is all that we have. [facility] did evaluate the patient and he was not compliant with avoiding them assist him.  Routine office visit 3 months or sooner if needed.   Signed, [address] [place], [place], BSFM 08/17/2014 8:30 AM

## 2014-09-02 ENCOUNTER — Emergency Department (HOSPITAL_COMMUNITY)
Admission: EM | Admit: 2014-09-02 | Discharge: 2014-09-02 | Disposition: A | Discharge status: Home / Self Care | Payer: Medicare Other | Attending: Emergency Medicine | Admitting: Emergency Medicine

## 2014-09-02 ENCOUNTER — Encounter (HOSPITAL_COMMUNITY): Payer: Self-pay | Admitting: *Deleted

## 2014-09-02 ENCOUNTER — Emergency Department (HOSPITAL_COMMUNITY): Payer: Medicare Other

## 2014-09-02 DIAGNOSIS — Z79899 Other long term (current) drug therapy: Secondary | ICD-10-CM | POA: Diagnosis not present

## 2014-09-02 DIAGNOSIS — Z794 Long term (current) use of insulin: Secondary | ICD-10-CM | POA: Diagnosis not present

## 2014-09-02 DIAGNOSIS — E119 Type 2 diabetes mellitus without complications: Secondary | ICD-10-CM | POA: Insufficient documentation

## 2014-09-02 DIAGNOSIS — Z8639 Personal history of other endocrine, nutritional and metabolic disease: Secondary | ICD-10-CM | POA: Insufficient documentation

## 2014-09-02 DIAGNOSIS — J441 Chronic obstructive pulmonary disease with (acute) exacerbation: Secondary | ICD-10-CM | POA: Diagnosis not present

## 2014-09-02 DIAGNOSIS — Z7982 Long term (current) use of aspirin: Secondary | ICD-10-CM | POA: Diagnosis not present

## 2014-09-02 DIAGNOSIS — Z09 Encounter for follow-up examination after completed treatment for conditions other than malignant neoplasm: Secondary | ICD-10-CM | POA: Insufficient documentation

## 2014-09-02 DIAGNOSIS — Z86010 Personal history of colonic polyps: Secondary | ICD-10-CM | POA: Insufficient documentation

## 2014-09-02 DIAGNOSIS — Z87891 Personal history of nicotine dependence: Secondary | ICD-10-CM | POA: Insufficient documentation

## 2014-09-02 DIAGNOSIS — I10 Essential (primary) hypertension: Secondary | ICD-10-CM | POA: Insufficient documentation

## 2014-09-02 DIAGNOSIS — R0602 Shortness of breath: Secondary | ICD-10-CM | POA: Diagnosis present

## 2014-09-02 DIAGNOSIS — J40 Bronchitis, not specified as acute or chronic: Secondary | ICD-10-CM

## 2014-09-02 LAB — COMPREHENSIVE METABOLIC PANEL
ALBUMIN: 3.9 g/dL (ref 3.5–5.2)
ALK PHOS: 79 U/L (ref 39–117)
ALT: 18 U/L (ref 0–53)
ANION GAP: 14 (ref 5–15)
AST: 15 U/L (ref 0–37)
BUN: 16 mg/dL (ref 6–23)
CO2: 26 mEq/L (ref 19–32)
Calcium: 9.3 mg/dL (ref 8.4–10.5)
Chloride: 94 mEq/L — ABNORMAL LOW (ref 96–112)
Creatinine, Ser: 0.84 mg/dL (ref 0.50–1.35)
GFR calc Af Amer: >90 mL/min (ref >90)
GFR calc non Af Amer: 82 mL/min — ABNORMAL LOW (ref >90)
Glucose, Bld: 351 mg/dL — ABNORMAL HIGH (ref 70–99)
POTASSIUM: 4.9 meq/L (ref 3.7–5.3)
SODIUM: 134 meq/L — AB (ref 137–147)
TOTAL PROTEIN: 7 g/dL (ref 6.0–8.3)
Total Bilirubin: <0.2 mg/dL — ABNORMAL LOW (ref 0.3–1.2)

## 2014-09-02 LAB — PRO B NATRIURETIC PEPTIDE: Pro B Natriuretic peptide (BNP): 27.1 pg/mL (ref 0–450)

## 2014-09-02 LAB — CBC WITH DIFFERENTIAL/PLATELET
Basophils Absolute: 0.1 10*3/uL (ref 0.0–0.1)
Basophils Relative: 1 % (ref 0–1)
EOS PCT: 6 % — AB (ref 0–5)
Eosinophils Absolute: 0.4 10*3/uL (ref 0.0–0.7)
HEMATOCRIT: 42 % (ref 39.0–52.0)
Hemoglobin: 13.8 g/dL (ref 13.0–17.0)
Lymphocytes Relative: 21 % (ref 12–46)
Lymphs Abs: 1.3 10*3/uL (ref 0.7–4.0)
MCH: 27.2 pg (ref 26.0–34.0)
MCHC: 32.9 g/dL (ref 30.0–36.0)
MCV: 82.8 fL (ref 78.0–100.0)
Monocytes Absolute: 0.6 10*3/uL (ref 0.1–1.0)
Monocytes Relative: 9 % (ref 3–12)
Neutro Abs: 3.9 10*3/uL (ref 1.7–7.7)
Neutrophils Relative %: 63 % (ref 43–77)
Platelets: 255 10*3/uL (ref 150–400)
RBC: 5.07 MIL/uL (ref 4.22–5.81)
RDW: 14.4 % (ref 11.5–15.5)
WBC: 6.1 10*3/uL (ref 4.0–10.5)

## 2014-09-02 LAB — TROPONIN I

## 2014-09-02 MED ORDER — IPRATROPIUM BROMIDE 0.02 % IN SOLN
0.5000 mg | Freq: Once | RESPIRATORY_TRACT | Status: AC
  Administered 2014-09-02: 0.5 mg via RESPIRATORY_TRACT
  Filled 2014-09-02: qty 2.5

## 2014-09-02 MED ORDER — METHYLPREDNISOLONE SODIUM SUCC 125 MG IJ SOLR
125.0000 mg | Freq: Once | INTRAMUSCULAR | Status: AC
  Administered 2014-09-02: 125 mg via INTRAVENOUS
  Filled 2014-09-02: qty 2

## 2014-09-02 MED ORDER — SODIUM CHLORIDE 0.9 % IV SOLN
INTRAVENOUS | Status: DC
  Administered 2014-09-02: 09:00:00 via INTRAVENOUS

## 2014-09-02 MED ORDER — ALBUTEROL SULFATE (2.5 MG/3ML) 0.083% IN NEBU
2.5000 mg | INHALATION_SOLUTION | Freq: Once | RESPIRATORY_TRACT | Status: AC
  Administered 2014-09-02: 2.5 mg via RESPIRATORY_TRACT
  Filled 2014-09-02: qty 3

## 2014-09-02 MED ORDER — PREDNISONE 20 MG PO TABS: ORAL_TABLET | ORAL | Status: DC

## 2014-09-02 MED ORDER — ALBUTEROL (5 MG/ML) CONTINUOUS INHALATION SOLN
15.0000 mg/h | INHALATION_SOLUTION | Freq: Once | RESPIRATORY_TRACT | Status: AC
  Administered 2014-09-02: 15 mg/h via RESPIRATORY_TRACT
  Filled 2014-09-02: qty 20

## 2014-09-02 MED ORDER — IPRATROPIUM-ALBUTEROL 0.5-2.5 (3) MG/3ML IN SOLN
3.0000 mL | RESPIRATORY_TRACT | Status: DC
  Administered 2014-09-02: 3 mL via RESPIRATORY_TRACT
  Filled 2014-09-02: qty 3

## 2014-09-02 MED ORDER — ALBUTEROL SULFATE (2.5 MG/3ML) 0.083% IN NEBU: 5.0000 mg | INHALATION_SOLUTION | Freq: Once | RESPIRATORY_TRACT | Status: DC

## 2014-09-02 MED ORDER — DOXYCYCLINE HYCLATE 100 MG PO TABS
100.0000 mg | ORAL_TABLET | Freq: Once | ORAL | Status: AC
  Administered 2014-09-02: 100 mg via ORAL
  Filled 2014-09-02: qty 1

## 2014-09-02 MED ORDER — DOXYCYCLINE HYCLATE 100 MG PO CAPS: 100.0000 mg | ORAL_CAPSULE | Freq: Two times a day (BID) | ORAL | Status: DC

## 2014-09-02 MED ORDER — IPRATROPIUM BROMIDE 0.02 % IN SOLN: 0.5000 mg | Freq: Once | RESPIRATORY_TRACT | Status: DC

## 2014-09-02 NOTE — ED Notes (Signed)
EDP at bedside  

## 2014-09-02 NOTE — ED Notes (Signed)
Pt ambulated with pulse oximetry. Pt heart rate during ambulation ranged from 101-108, pulse ox ranged from 88-90%. No increased dyspnea noted during ambulation. EDP aware and reported would talk with pt about next step in care plan.

## 2014-09-02 NOTE — ED Notes (Addendum)
RT at bedside preparing for duoneb.Will ambulate post treatment.

## 2014-09-02 NOTE — ED Provider Notes (Signed)
CSN: [id number]     Arrival date & time 09/02/14  0807 History  This chart was scribed for Edward Norrie, MD by [name], ED Scribe. This patient was seen in room APA06/APA06 and the patient's care was started at 8:28 AM.    Chief Complaint  Patient presents with  . Shortness of Breath   The history is provided by the patient. No language interpreter was used.     HPI Comments: Edward [name] is a 79 y.o. male with past medical history of COPD who presents to the Emergency Department complaining of two weeks of worsening SOB, acutely worsening this morning. His SOB is worse with ambulation. Patient reports productive cough (white sputum) and wheezing. Patient denies fever, chills, chest pain, sore throat, rhinorrhea, vomiting or diarrhea. He utilizes a nebulizer at home but ran out of the medication to use with this "a long time ago". He utilized his home O2 this morning (regularly uses O2 2L [place] as needed).   He is a former smoker; quit date 2 years PTA.   PCP at [facility], PA-C.  Patient does not see a doctor for his lungs.  Cardiologist Dr [place]  Past Medical History  Diagnosis Date  . Diabetes mellitus   . COPD (chronic obstructive pulmonary disease)   . Hypertension   . Allergy     Rhinitis  . Elevated lipids   . Pulmonary fibrosis   . Bronchitis   . Colon polyps   . Vitamin D deficiency   . PSA elevation   . Chronic respiratory failure   . Hypercholesterolemia    Past Surgical History  Procedure Laterality Date  . Cataract extraction w/phaco  06/25/2012    Procedure: CATARACT EXTRACTION PHACO AND INTRAOCULAR LENS PLACEMENT (IOC);  Surgeon: Elta Guadeloupe T. Gershon Crane, MD;  Location: [facility];  Service: Ophthalmology;  Laterality: Left;  CDE=19.01  . Cataract extraction w/phaco  07/09/2012    Procedure: CATARACT EXTRACTION PHACO AND INTRAOCULAR LENS PLACEMENT (IOC);  Surgeon: Elta Guadeloupe T. Gershon Crane, MD;  Location: [facility];  Service: Ophthalmology;  Laterality:  Right;  CDE: 20.09   Family History  Problem Relation Age of Onset  . CAD Other   . Diabetes Other   . Heart disease Mother    History  Substance Use Topics  . Smoking status: Former Smoker -- 1.50 packs/day for 60 years    Types: Cigarettes    Quit date: 12/31/2012  . Smokeless tobacco: Never Used  . Alcohol Use: No  lives at home Lives with spouse Uses oxygen 2 lpm  prn  Review of Systems  Constitutional: Negative for fever and chills.  HENT: Negative for rhinorrhea and sore throat.   Respiratory: Positive for cough, shortness of breath and wheezing.   Cardiovascular: Negative for chest pain.  Gastrointestinal: Negative for diarrhea.  All other systems reviewed and are negative.  Allergies  Ace inhibitors  Home Medications   Prior to Admission medications   Medication Sig Start Date End Date Taking? Authorizing Provider  albuterol ([name];VENTOLIN HFA) 108 (90 BASE) MCG/ACT inhaler Inhale 2 puffs into the lungs every 6 (six) hours as needed for wheezing or shortness of breath. 11/14/13  Yes Mary B Dixon, PA-C  albuterol (PROVENTIL) (2.5 MG/3ML) 0.083% nebulizer solution Take 2.5 mg by nebulization every 6 (six) hours as needed for wheezing or shortness of breath.    Yes Historical Provider, MD  aspirin EC 81 MG tablet Take 81 mg by mouth every morning.  Yes Historical Provider, MD  LANTUS SOLOSTAR 100 UNIT/ML Solostar Pen Inject 20 Units into the skin at bedtime. 07/10/14  Yes Historical Provider, MD  loratadine (ALLERGY RELIEF) 10 MG tablet Take 10 mg by mouth at bedtime as needed for allergies.   Yes Historical Provider, MD  losartan (COZAAR) 100 MG tablet Take 100 mg by mouth every morning.   Yes Historical Provider, MD  metFORMIN (GLUCOPHAGE) 1000 MG tablet Take 1,000 mg by mouth 2 (two) times daily.   Yes Historical Provider, MD  Omega-3 Fatty Acids (FISH OIL) 1000 MG CAPS Take 1 capsule by mouth 2 (two) times daily.    Yes Historical Provider, MD   pravastatin (PRAVACHOL) 80 MG tablet Take 80 mg by mouth at bedtime.  04/17/14  Yes Historical Provider, MD  SYMBICORT 160-4.5 MCG/ACT inhaler Inhale 1 puff into the lungs 2 (two) times daily. 07/10/14  Yes Historical Provider, MD  tiotropium (SPIRIVA HANDIHALER) 18 MCG inhalation capsule Place 1 capsule (18 mcg total) into inhaler and inhale daily. 12/01/13  Yes Orlena Sheldon, PA-C  Insulin Detemir (LEVEMIR FLEXTOUCH) 100 UNIT/ML Pen Inject 20 units at bedtime Patient not taking: Reported on 09/02/2014 05/12/14   Alycia Rossetti, MD   BP 155/95 mmHg  Pulse 95  Temp(Src) 98.8 F (37.1 C) (Oral)  Resp 28  Ht 5\' 5"  (1.651 m)  Wt 160 lb (72.576 kg)  BMI 26.63 kg/m2  SpO2 97%  Vital signs normal   Physical Exam  Constitutional: He is oriented to person, place, and time. He appears well-developed and well-nourished.  Non-toxic appearance. He does not appear ill. No distress.  HENT:  Head: Normocephalic and atraumatic.  Right Ear: External ear normal.  Left Ear: External ear normal.  Nose: Nose normal. No mucosal edema or rhinorrhea.  Mouth/Throat: Oropharynx is clear and moist and mucous membranes are normal. No dental abscesses or uvula swelling.  Eyes: Conjunctivae and EOM are normal. Pupils are equal, round, and reactive to light.  Neck: Normal range of motion and full passive range of motion without pain. Neck supple.  Cardiovascular: Normal rate, regular rhythm and normal heart sounds.  Exam reveals no gallop and no friction rub.   No murmur heard. Pulmonary/Chest: Accessory muscle usage present. He is in respiratory distress. He has decreased breath sounds. He has wheezes (Diffuse high pitched inspiratory and expiratory wheezing). He has no rhonchi. He has no rales. He exhibits no tenderness and no crepitus.  Abdominal: Soft. Normal appearance and bowel sounds are normal. He exhibits no distension. There is no tenderness. There is no rebound and no guarding.  Musculoskeletal: Normal  range of motion. He exhibits no edema or tenderness.  Moves all extremities well.   Neurological: He is alert and oriented to person, place, and time. He has normal strength. No cranial nerve deficit.  Skin: Skin is warm, dry and intact. No rash noted. No erythema. No pallor.  Psychiatric: He has a normal mood and affect. His speech is normal and behavior is normal. His mood appears not anxious.  Nursing note and vitals reviewed.   ED Course  Procedures  Medications  0.9 %  sodium chloride infusion ( Intravenous Stopped 09/02/14 1153)  albuterol (PROVENTIL,VENTOLIN) solution continuous neb (15 mg/hr Nebulization Given 09/02/14 0904)  ipratropium (ATROVENT) nebulizer solution 0.5 mg (0.5 mg Nebulization Given 09/02/14 0904)  methylPREDNISolone sodium succinate (SOLU-MEDROL) 125 mg/2 mL injection 125 mg (125 mg Intravenous Given 09/02/14 0858)  doxycycline (VIBRA-TABS) tablet 100 mg (100 mg Oral Given 09/02/14 1153)  albuterol (PROVENTIL) (2.5 MG/3ML) 0.083% nebulizer solution 2.5 mg (2.5 mg Nebulization Given 09/02/14 1207)     DIAGNOSTIC STUDIES: Oxygen Saturation is 96% on RA, adequate by my interpretation.    COORDINATION OF CARE: 8:35 AM Discussed treatment plan with pt at bedside and pt agreed to plan. Patient was started on a continuous nebulizer, given IV steroids  9:53 AM Patient reports he is doing well on his breathing treatment. Exam shows improved air movement; expiratory low pitched rhonchi. Patient still has more medication before he finishes. 11:30 AM Patient reports that he is feeling well and is ready to return home. Exam shows a few scattered rhonchi. Discussed testing results and administration of one more breathing treatment. Patient was started on doxycycline for presumed bronchitis  12:49 PM Nurse explains that sats are 88-90% on RA when ambulating with heart rate 108; patient is feeling well without making him feel more SOB. . Patient has oxygen to use at home as  needed.  Recheck at discharge he has rare scattered rhonchi which may be his baseline. Pt feel ready to be discharged.    Labs Review Results for orders placed or performed during the hospital encounter of 09/02/14  CBC with Differential  Result Value Ref Range   WBC 6.1 4.0 - 10.5 K/uL   RBC 5.07 4.22 - 5.81 MIL/uL   Hemoglobin 13.8 13.0 - 17.0 g/dL   HCT 42.0 39.0 - 52.0 %   MCV 82.8 78.0 - 100.0 fL   MCH 27.2 26.0 - 34.0 pg   MCHC 32.9 30.0 - 36.0 g/dL   RDW 14.4 11.5 - 15.5 %   Platelets 255 150 - 400 K/uL   Neutrophils Relative % 63 43 - 77 %   Neutro Abs 3.9 1.7 - 7.7 K/uL   Lymphocytes Relative 21 12 - 46 %   Lymphs Abs 1.3 0.7 - 4.0 K/uL   Monocytes Relative 9 3 - 12 %   Monocytes Absolute 0.6 0.1 - 1.0 K/uL   Eosinophils Relative 6 (H) 0 - 5 %   Eosinophils Absolute 0.4 0.0 - 0.7 K/uL   Basophils Relative 1 0 - 1 %   Basophils Absolute 0.1 0.0 - 0.1 K/uL  Comprehensive metabolic panel  Result Value Ref Range   Sodium 134 (L) 137 - 147 mEq/L   Potassium 4.9 3.7 - 5.3 mEq/L   Chloride 94 (L) 96 - 112 mEq/L   CO2 26 19 - 32 mEq/L   Glucose, Bld 351 (H) 70 - 99 mg/dL   BUN 16 6 - 23 mg/dL   Creatinine, Ser 0.84 0.50 - 1.35 mg/dL   Calcium 9.3 8.4 - 10.5 mg/dL   Total Protein 7.0 6.0 - 8.3 g/dL   Albumin 3.9 3.5 - 5.2 g/dL   AST 15 0 - 37 U/L   ALT 18 0 - 53 U/L   Alkaline Phosphatase 79 39 - 117 U/L   Total Bilirubin <0.2 (L) 0.3 - 1.2 mg/dL   GFR calc non Af Amer 82 (L) >90 mL/min   GFR calc Af Amer >90 >90 mL/min   Anion gap 14 5 - 15  Troponin I  Result Value Ref Range   Troponin I <0.30 <0.30 ng/mL  Pro b natriuretic peptide  Result Value Ref Range   Pro B Natriuretic peptide (BNP) 27.1 0 - 450 pg/mL   Laboratory interpretation all normal except hyperglycemia  Imaging Review Dg Chest 2 View (if Patient Has Fever And/or Copd)  09/02/2014   CLINICAL DATA:  Shortness of breath for 2 or 3 weeks, worse today. History of emphysema/ COPD. Initial encounter.   EXAM: CHEST  2 VIEW  COMPARISON:  03/20/2014.  FINDINGS: The heart size and mediastinal contours are stable. The lungs are hyperinflated but clear. There are stable prominent nipple shadows bilaterally. Focal posterior eventration of 1 of the hemidiaphragms on the lateral view is unchanged. No acute osseous findings are demonstrated.  IMPRESSION: Stable chronic findings of emphysema/ COPD. No acute superimposed process identified.   Electronically Signed   By: Camie Patience M.D.   On: 09/02/2014 08:57     EKG Interpretation   Date/Time:  Wednesday September 02 2014 08:13:45 EST Ventricular Rate:  104 PR Interval:  141 QRS Duration: 74 QT Interval:  330 QTC Calculation: 434 R Axis:   81 Text Interpretation:  Sinus tachycardia Borderline right axis deviation  Probable anteroseptal infarct, old No significant change since last  tracing 20 Mar 2014 Confirmed by Zamariah Seaborn  MD-I, Minaal Struckman ([id number]) on 09/02/2014  8:34:08 AM      MDM   Final diagnoses:  COPD exacerbation  Bronchitis   Discharge Medication List as of 09/02/2014  1:36 PM    START taking these medications   Details  doxycycline (VIBRAMYCIN) 100 MG capsule Take 1 capsule (100 mg total) by mouth 2 (two) times daily., Starting 09/02/2014, Until Discontinued, Print    predniSONE (DELTASONE) 20 MG tablet Take 2 po qd x 3d then 1 po qd x 4d, Print        Plan discharge  Rolland Porter, MD, FACEP     I personally performed the services described in this documentation, which was scribed in my presence. The recorded information has been reviewed and considered.      Edward Norrie, MD 09/02/14 [phone]

## 2014-09-02 NOTE — ED Notes (Signed)
RT aware

## 2014-09-02 NOTE — ED Notes (Signed)
Pt states he has a HX of COPD, uses O2 2L [place] PRN, has had increasing SOB x2 weeks, states he is "out of breathing treatments". Pt in no apparent distress, wheezing easily heard. Pt denies chest pain.

## 2014-09-02 NOTE — Discharge Instructions (Signed)
Use your nebulizer and inhaler for your wheezing 2 puffs every 4 hrs or your nebulizer every 4 hours. Take the antibiotics and prednisone until gone. Monitor your blood sugar closely the prednisone could make it get higher. Return to the ED if you get worse such as fever, or your wheezing and shortness of breath getting worse.

## 2014-09-03 ENCOUNTER — Telehealth: Payer: Self-pay | Admitting: Family Medicine

## 2014-09-03 ENCOUNTER — Other Ambulatory Visit: Payer: Self-pay | Admitting: Physician Assistant

## 2014-09-03 DIAGNOSIS — E119 Type 2 diabetes mellitus without complications: Secondary | ICD-10-CM

## 2014-09-03 DIAGNOSIS — E785 Hyperlipidemia, unspecified: Secondary | ICD-10-CM

## 2014-09-03 DIAGNOSIS — J449 Chronic obstructive pulmonary disease, unspecified: Secondary | ICD-10-CM

## 2014-09-03 DIAGNOSIS — I10 Essential (primary) hypertension: Secondary | ICD-10-CM

## 2014-09-03 MED ORDER — ALBUTEROL SULFATE (2.5 MG/3ML) 0.083% IN NEBU: 2.5000 mg | INHALATION_SOLUTION | Freq: Four times a day (QID) | RESPIRATORY_TRACT | Status: DC | PRN

## 2014-09-03 NOTE — Telephone Encounter (Signed)
[facility] calling to requesting RF of nebulizer solution.  Rx sent

## 2014-09-04 NOTE — Telephone Encounter (Signed)
Medication refilled per protocol. 

## 2014-09-14 ENCOUNTER — Other Ambulatory Visit: Payer: Self-pay | Admitting: Family Medicine

## 2014-09-14 NOTE — Telephone Encounter (Signed)
Refill appropriate and filled per protocol. 

## 2014-09-24 ENCOUNTER — Emergency Department (HOSPITAL_COMMUNITY): Payer: Medicare Other

## 2014-09-24 ENCOUNTER — Emergency Department (HOSPITAL_COMMUNITY)
Admission: EM | Admit: 2014-09-24 | Discharge: 2014-09-24 | Disposition: A | Discharge status: Home / Self Care | Payer: Medicare Other | Attending: Emergency Medicine | Admitting: Emergency Medicine

## 2014-09-24 ENCOUNTER — Encounter (HOSPITAL_COMMUNITY): Payer: Self-pay

## 2014-09-24 DIAGNOSIS — I10 Essential (primary) hypertension: Secondary | ICD-10-CM | POA: Diagnosis not present

## 2014-09-24 DIAGNOSIS — E780 Pure hypercholesterolemia: Secondary | ICD-10-CM | POA: Diagnosis not present

## 2014-09-24 DIAGNOSIS — E119 Type 2 diabetes mellitus without complications: Secondary | ICD-10-CM | POA: Insufficient documentation

## 2014-09-24 DIAGNOSIS — IMO0001 Reserved for inherently not codable concepts without codable children: Secondary | ICD-10-CM

## 2014-09-24 DIAGNOSIS — Z794 Long term (current) use of insulin: Secondary | ICD-10-CM | POA: Diagnosis not present

## 2014-09-24 DIAGNOSIS — Z86010 Personal history of colonic polyps: Secondary | ICD-10-CM | POA: Insufficient documentation

## 2014-09-24 DIAGNOSIS — Z87891 Personal history of nicotine dependence: Secondary | ICD-10-CM | POA: Diagnosis not present

## 2014-09-24 DIAGNOSIS — J40 Bronchitis, not specified as acute or chronic: Secondary | ICD-10-CM

## 2014-09-24 DIAGNOSIS — R0602 Shortness of breath: Secondary | ICD-10-CM

## 2014-09-24 DIAGNOSIS — Z792 Long term (current) use of antibiotics: Secondary | ICD-10-CM | POA: Diagnosis not present

## 2014-09-24 DIAGNOSIS — J441 Chronic obstructive pulmonary disease with (acute) exacerbation: Secondary | ICD-10-CM | POA: Diagnosis not present

## 2014-09-24 DIAGNOSIS — Z7982 Long term (current) use of aspirin: Secondary | ICD-10-CM | POA: Diagnosis not present

## 2014-09-24 DIAGNOSIS — Z79899 Other long term (current) drug therapy: Secondary | ICD-10-CM | POA: Diagnosis not present

## 2014-09-24 LAB — CBC WITH DIFFERENTIAL/PLATELET
BASOS PCT: 1 % (ref 0–1)
Basophils Absolute: 0 10*3/uL (ref 0.0–0.1)
EOS ABS: 0.4 10*3/uL (ref 0.0–0.7)
EOS PCT: 5 % (ref 0–5)
HCT: 42 % (ref 39.0–52.0)
HEMOGLOBIN: 13.6 g/dL (ref 13.0–17.0)
Lymphocytes Relative: 12 % (ref 12–46)
Lymphs Abs: 1.1 10*3/uL (ref 0.7–4.0)
MCH: 27.4 pg (ref 26.0–34.0)
MCHC: 32.4 g/dL (ref 30.0–36.0)
MCV: 84.5 fL (ref 78.0–100.0)
Monocytes Absolute: 0.8 10*3/uL (ref 0.1–1.0)
Monocytes Relative: 10 % (ref 3–12)
NEUTROS PCT: 72 % (ref 43–77)
Neutro Abs: 6.4 10*3/uL (ref 1.7–7.7)
PLATELETS: 314 10*3/uL (ref 150–400)
RBC: 4.97 MIL/uL (ref 4.22–5.81)
RDW: 14.6 % (ref 11.5–15.5)
WBC: 8.8 10*3/uL (ref 4.0–10.5)

## 2014-09-24 LAB — COMPREHENSIVE METABOLIC PANEL
ALBUMIN: 3.9 g/dL (ref 3.5–5.2)
ALK PHOS: 67 U/L (ref 39–117)
ALT: 18 U/L (ref 0–53)
ANION GAP: 7 (ref 5–15)
AST: 17 U/L (ref 0–37)
BUN: 13 mg/dL (ref 6–23)
CALCIUM: 8.9 mg/dL (ref 8.4–10.5)
CO2: 29 mmol/L (ref 19–32)
Chloride: 98 mEq/L (ref 96–112)
Creatinine, Ser: 0.87 mg/dL (ref 0.50–1.35)
GFR calc non Af Amer: 81 mL/min — ABNORMAL LOW (ref >90)
GLUCOSE: 304 mg/dL — AB (ref 70–99)
POTASSIUM: 4.5 mmol/L (ref 3.5–5.1)
Sodium: 134 mmol/L — ABNORMAL LOW (ref 135–145)
TOTAL PROTEIN: 6.8 g/dL (ref 6.0–8.3)
Total Bilirubin: 0.4 mg/dL (ref 0.3–1.2)

## 2014-09-24 LAB — BRAIN NATRIURETIC PEPTIDE: B NATRIURETIC PEPTIDE 5: 142 pg/mL — AB (ref 0.0–100.0)

## 2014-09-24 LAB — TROPONIN I: Troponin I: <0.03 ng/mL (ref <0.031)

## 2014-09-24 MED ORDER — LEVOFLOXACIN 500 MG PO TABS: 500.0000 mg | ORAL_TABLET | Freq: Every day | ORAL | Status: DC

## 2014-09-24 MED ORDER — ALBUTEROL SULFATE (2.5 MG/3ML) 0.083% IN NEBU
5.0000 mg | INHALATION_SOLUTION | Freq: Once | RESPIRATORY_TRACT | Status: AC
  Administered 2014-09-24: 5 mg via RESPIRATORY_TRACT
  Filled 2014-09-24: qty 6

## 2014-09-24 MED ORDER — IPRATROPIUM BROMIDE 0.02 % IN SOLN
0.5000 mg | Freq: Once | RESPIRATORY_TRACT | Status: AC
  Administered 2014-09-24: 0.5 mg via RESPIRATORY_TRACT
  Filled 2014-09-24: qty 2.5

## 2014-09-24 MED ORDER — PREDNISONE 20 MG PO TABS: ORAL_TABLET | ORAL | Status: DC

## 2014-09-24 MED ORDER — METHYLPREDNISOLONE SODIUM SUCC 125 MG IJ SOLR
125.0000 mg | Freq: Once | INTRAMUSCULAR | Status: AC
  Administered 2014-09-24: 125 mg via INTRAVENOUS
  Filled 2014-09-24: qty 2

## 2014-09-24 NOTE — ED Provider Notes (Signed)
CSN: [id number]     Arrival date & time 09/24/14  1226 History  This chart was scribed for Maudry Diego, MD by [name], ED Scribe. This patient was seen in room APA06/APA06 and the patient's care was started at 1:25 PM.    Chief Complaint  Patient presents with  . Shortness of Breath   Patient is a 79 y.o. male presenting with shortness of breath. The history is provided by the patient (pt complains of SOB). No language interpreter was used.  Shortness of Breath Severity:  Moderate Onset quality:  Gradual Duration:  3 weeks Timing:  Constant Progression:  Worsening Chronicity:  Recurrent Context: URI   Relieved by:  Oxygen Worsened by:  Nothing tried Ineffective treatments:  None tried Associated symptoms: cough and wheezing   Associated symptoms: no abdominal pain, no chest pain, no headaches and no rash   Risk factors: tobacco use      HPI Comments: Zacchary HENRICK MCGUE is a 79 y.o. male who presents to the Emergency Department complaining of occasional productive cough ("clear" sputum) and gradually worsening SOB for 2-3 weeks.   Patient is on 2L O2 at home. He is a former smoker (1.5 ppd for 60 years).   Past Medical History  Diagnosis Date  . Diabetes mellitus   . COPD (chronic obstructive pulmonary disease)   . Hypertension   . Allergy     Rhinitis  . Elevated lipids   . Pulmonary fibrosis   . Bronchitis   . Colon polyps   . Vitamin D deficiency   . PSA elevation   . Chronic respiratory failure   . Hypercholesterolemia    Past Surgical History  Procedure Laterality Date  . Cataract extraction w/phaco  06/25/2012    Procedure: CATARACT EXTRACTION PHACO AND INTRAOCULAR LENS PLACEMENT (IOC);  Surgeon: Elta Guadeloupe T. Gershon Crane, MD;  Location: [facility];  Service: Ophthalmology;  Laterality: Left;  CDE=19.01  . Cataract extraction w/phaco  07/09/2012    Procedure: CATARACT EXTRACTION PHACO AND INTRAOCULAR LENS PLACEMENT (IOC);  Surgeon: Elta Guadeloupe T. Gershon Crane, MD;  Location: [facility];   Service: Ophthalmology;  Laterality: Right;  CDE: 20.09   Family History  Problem Relation Age of Onset  . CAD Other   . Diabetes Other   . Heart disease Mother    History  Substance Use Topics  . Smoking status: Former Smoker -- 1.50 packs/day for 60 years    Types: Cigarettes    Quit date: 12/31/2012  . Smokeless tobacco: Never Used  . Alcohol Use: No    Review of Systems  Constitutional: Negative for appetite change and fatigue.  HENT: Negative for congestion, ear discharge and sinus pressure.   Eyes: Negative for discharge.  Respiratory: Positive for cough, shortness of breath and wheezing.   Cardiovascular: Negative for chest pain.  Gastrointestinal: Negative for abdominal pain and diarrhea.  Genitourinary: Negative for frequency and hematuria.  Musculoskeletal: Negative for back pain.  Skin: Negative for rash.  Neurological: Negative for seizures and headaches.  Psychiatric/Behavioral: Negative for hallucinations.   Allergies  Ace inhibitors  Home Medications   Prior to Admission medications   Medication Sig Start Date End Date Taking? Authorizing Provider  losartan (COZAAR) 100 MG tablet TAKE 1 TABLET BY MOUTH ONCE DAILY 09/04/14  Yes Orlena Sheldon, PA-C  metFORMIN (GLUCOPHAGE) 1000 MG tablet TAKE 1 TABLET BY MOUTH 2 TIMES A DAY 09/04/14  Yes Mary B Dixon, PA-C  pravastatin (PRAVACHOL) 80 MG tablet TAKE 1 TABLET BY MOUTH ONCE  DAILY AT BEDTIME 09/04/14  Yes Orlena Sheldon, PA-C  albuterol (PROVENTIL) (2.5 MG/3ML) 0.083% nebulizer solution Take 3 mLs (2.5 mg total) by nebulization every 6 (six) hours as needed for wheezing or shortness of breath. 09/03/14   Orlena Sheldon, PA-C  aspirin 81 MG EC tablet TAKE 1 TABLET BY MOUTH ONCE DAILY 09/04/14   Orlena Sheldon, PA-C  doxycycline (VIBRAMYCIN) 100 MG capsule Take 1 capsule (100 mg total) by mouth 2 (two) times daily. 09/02/14   [name], MD  EASY TOUCH PEN NEEDLES 31G X 8 MM MISC USE 1 PEN NEEDLE ONCE DAILY AS DIRECTED 09/04/14    Orlena Sheldon, PA-C  Insulin Detemir (LEVEMIR FLEXTOUCH) 100 UNIT/ML Pen Inject 20 units at bedtime Patient not taking: Reported on 09/02/2014 05/12/14   Alycia Rossetti, MD  LANTUS SOLOSTAR 100 UNIT/ML Solostar Pen START INJECTING 15 UNITS UNDER THE SKIN EACH NIGHT, INCREASE BY ONE UNIT EACH NIGHT AS DIRECTED UP TO 40 UNITS EACH NIGHT 09/04/14   Orlena Sheldon, PA-C  loratadine (ALLERGY RELIEF) 10 MG tablet Take 10 mg by mouth at bedtime as needed for allergies.    Historical Provider, MD  Omega-3 Fatty Acids (FISH OIL) 1000 MG CAPS Take 1 capsule by mouth 2 (two) times daily.     Historical Provider, MD  ONE TOUCH ULTRA TEST test strip CHECK FASTING BLOOD SUGAR TWICE DAILY 09/04/14   Orlena Sheldon, PA-C  ONE TOUCH ULTRA TEST test strip CHECK FASTING BLOOD SUGARS EVERY MORNING 09/14/14   Orlena Sheldon, PA-C  ONETOUCH DELICA LANCETS 03U MISC USE TO CHECK BLOOD SUGAR TWICE DAILY AS DIRECTED 09/04/14   Orlena Sheldon, PA-C  predniSONE (DELTASONE) 20 MG tablet Take 2 po qd x 3d then 1 po qd x 4d 09/02/14   [name], MD  PROAIR HFA 108 (90 BASE) MCG/ACT inhaler INHALE 2 PUFFS INTO LUNGS EVERY 6 HOURS AS NEEDED FOR WHEEZING OR SHORTNESS OF BREATH 09/04/14   Orlena Sheldon, PA-C  SPIRIVA HANDIHALER 18 MCG inhalation capsule INHALE CONTENTS OF 1 CAPSULE VIA HANDIHALER ONCE A DAY 09/04/14   Orlena Sheldon, PA-C  SYMBICORT 160-4.5 MCG/ACT inhaler INHALE 2 PUFFS INTO LUNGS TWICE A DAY 09/04/14   Mary B Dixon, PA-C   BP 140/73 mmHg  Pulse 109  Temp(Src) 97.6 F (36.4 C) (Oral)  Resp 19  Ht 5' (1.524 m)  Wt 160 lb (72.576 kg)  BMI 31.25 kg/m2  SpO2 94% Physical Exam  Constitutional: He is oriented to person, place, and time. He appears well-developed.  HENT:  Head: Normocephalic.  Eyes: Conjunctivae and EOM are normal. No scleral icterus.  Neck: Neck supple. No thyromegaly present.  Cardiovascular: Normal rate and regular rhythm.  Exam reveals no gallop and no friction rub.   No murmur heard. Pulmonary/Chest: No  stridor. He has wheezes (Moderate wheezing bilaterally). He exhibits no tenderness.  Abdominal: He exhibits no distension. There is no tenderness. There is no rebound.  Musculoskeletal: Normal range of motion. He exhibits no edema.  Lymphadenopathy:    He has no cervical adenopathy.  Neurological: He is oriented to person, place, and time. He exhibits normal muscle tone. Coordination normal.  Skin: No rash noted. No erythema.  Psychiatric: He has a normal mood and affect. His behavior is normal.    ED Course  Procedures   DIAGNOSTIC STUDIES: Oxygen Saturation is 94% on [place], low by my interpretation.    COORDINATION OF CARE: 1:27 PM Discussed treatment  plan with pt at bedside and pt agreed to plan.   Labs Review Labs Reviewed - No data to display  Imaging Review No results found.   EKG Interpretation None      MDM   Final diagnoses:  None    Copd,   tx with prednisone and levaquin  The chart was scribed for me under my direct supervision.  I personally performed the history, physical, and medical decision making and all procedures in the evaluation of this patient.Maudry Diego, MD 09/24/14 [phone]

## 2014-09-24 NOTE — Discharge Instructions (Signed)
Follow up with your md next week. °

## 2014-09-24 NOTE — ED Notes (Signed)
Pt c/o cough, productive at times, and sob x 2 or 3 weeks.  Denies fever.

## 2014-09-24 NOTE — ED Notes (Signed)
Patient states he wears O2 at home, patient placed on 2L [place].

## 2014-09-24 NOTE — ED Notes (Signed)
RT called

## 2014-10-10 ENCOUNTER — Emergency Department (HOSPITAL_COMMUNITY): Payer: Medicare Other

## 2014-10-10 ENCOUNTER — Encounter (HOSPITAL_COMMUNITY): Payer: Self-pay | Admitting: *Deleted

## 2014-10-10 ENCOUNTER — Emergency Department (HOSPITAL_COMMUNITY)
Admission: EM | Admit: 2014-10-10 | Discharge: 2014-10-10 | Disposition: A | Discharge status: Home / Self Care | Payer: Medicare Other | Attending: Emergency Medicine | Admitting: Emergency Medicine

## 2014-10-10 DIAGNOSIS — I10 Essential (primary) hypertension: Secondary | ICD-10-CM | POA: Insufficient documentation

## 2014-10-10 DIAGNOSIS — E119 Type 2 diabetes mellitus without complications: Secondary | ICD-10-CM | POA: Insufficient documentation

## 2014-10-10 DIAGNOSIS — Z794 Long term (current) use of insulin: Secondary | ICD-10-CM | POA: Insufficient documentation

## 2014-10-10 DIAGNOSIS — R0602 Shortness of breath: Secondary | ICD-10-CM

## 2014-10-10 DIAGNOSIS — R05 Cough: Secondary | ICD-10-CM | POA: Diagnosis not present

## 2014-10-10 DIAGNOSIS — Z7982 Long term (current) use of aspirin: Secondary | ICD-10-CM | POA: Insufficient documentation

## 2014-10-10 DIAGNOSIS — Z86010 Personal history of colonic polyps: Secondary | ICD-10-CM | POA: Diagnosis not present

## 2014-10-10 DIAGNOSIS — Z792 Long term (current) use of antibiotics: Secondary | ICD-10-CM | POA: Insufficient documentation

## 2014-10-10 DIAGNOSIS — Z79899 Other long term (current) drug therapy: Secondary | ICD-10-CM | POA: Diagnosis not present

## 2014-10-10 DIAGNOSIS — Z87891 Personal history of nicotine dependence: Secondary | ICD-10-CM | POA: Diagnosis not present

## 2014-10-10 DIAGNOSIS — J441 Chronic obstructive pulmonary disease with (acute) exacerbation: Secondary | ICD-10-CM | POA: Diagnosis not present

## 2014-10-10 DIAGNOSIS — J42 Unspecified chronic bronchitis: Secondary | ICD-10-CM | POA: Diagnosis not present

## 2014-10-10 DIAGNOSIS — E780 Pure hypercholesterolemia: Secondary | ICD-10-CM | POA: Insufficient documentation

## 2014-10-10 LAB — BASIC METABOLIC PANEL
ANION GAP: 5 (ref 5–15)
BUN: 15 mg/dL (ref 6–23)
CO2: 30 mmol/L (ref 19–32)
Calcium: 9 mg/dL (ref 8.4–10.5)
Chloride: 99 mEq/L (ref 96–112)
Creatinine, Ser: 1.01 mg/dL (ref 0.50–1.35)
GFR calc Af Amer: 81 mL/min — ABNORMAL LOW (ref >90)
GFR calc non Af Amer: 70 mL/min — ABNORMAL LOW (ref >90)
Glucose, Bld: 398 mg/dL — ABNORMAL HIGH (ref 70–99)
POTASSIUM: 4.7 mmol/L (ref 3.5–5.1)
Sodium: 134 mmol/L — ABNORMAL LOW (ref 135–145)

## 2014-10-10 LAB — CBC WITH DIFFERENTIAL/PLATELET
Basophils Absolute: 0.1 10*3/uL (ref 0.0–0.1)
Basophils Relative: 1 % (ref 0–1)
EOS ABS: 0.7 10*3/uL (ref 0.0–0.7)
Eosinophils Relative: 10 % — ABNORMAL HIGH (ref 0–5)
HCT: 42.4 % (ref 39.0–52.0)
Hemoglobin: 13.6 g/dL (ref 13.0–17.0)
Lymphocytes Relative: 17 % (ref 12–46)
Lymphs Abs: 1.2 10*3/uL (ref 0.7–4.0)
MCH: 27.2 pg (ref 26.0–34.0)
MCHC: 32.1 g/dL (ref 30.0–36.0)
MCV: 84.8 fL (ref 78.0–100.0)
Monocytes Absolute: 0.7 10*3/uL (ref 0.1–1.0)
Monocytes Relative: 10 % (ref 3–12)
NEUTROS PCT: 62 % (ref 43–77)
Neutro Abs: 4.5 10*3/uL (ref 1.7–7.7)
Platelets: 293 10*3/uL (ref 150–400)
RBC: 5 MIL/uL (ref 4.22–5.81)
RDW: 14.7 % (ref 11.5–15.5)
WBC: 7.2 10*3/uL (ref 4.0–10.5)

## 2014-10-10 LAB — TROPONIN I: Troponin I: <0.03 ng/mL (ref <0.031)

## 2014-10-10 MED ORDER — IPRATROPIUM-ALBUTEROL 0.5-2.5 (3) MG/3ML IN SOLN
3.0000 mL | Freq: Once | RESPIRATORY_TRACT | Status: AC
  Administered 2014-10-10: 3 mL via RESPIRATORY_TRACT
  Filled 2014-10-10: qty 3

## 2014-10-10 MED ORDER — PREDNISONE 50 MG PO TABS: ORAL_TABLET | ORAL | Status: DC

## 2014-10-10 MED ORDER — LEVOFLOXACIN 500 MG PO TABS: 500.0000 mg | ORAL_TABLET | Freq: Every day | ORAL | Status: DC

## 2014-10-10 MED ORDER — LEVOFLOXACIN 500 MG PO TABS
500.0000 mg | ORAL_TABLET | Freq: Once | ORAL | Status: AC
  Administered 2014-10-10: 500 mg via ORAL
  Filled 2014-10-10: qty 1

## 2014-10-10 MED ORDER — ALBUTEROL SULFATE (2.5 MG/3ML) 0.083% IN NEBU
2.5000 mg | INHALATION_SOLUTION | Freq: Once | RESPIRATORY_TRACT | Status: AC
  Administered 2014-10-10: 2.5 mg via RESPIRATORY_TRACT
  Filled 2014-10-10: qty 3

## 2014-10-10 MED ORDER — METHYLPREDNISOLONE SODIUM SUCC 125 MG IJ SOLR
125.0000 mg | Freq: Once | INTRAMUSCULAR | Status: AC
  Administered 2014-10-10: 125 mg via INTRAVENOUS
  Filled 2014-10-10: qty 2

## 2014-10-10 NOTE — ED Notes (Addendum)
Progressive SOB x 1 week.  Was seen prior to Christmas for same w/mild resolution.  Denies, CP, N/V/D, lightheadedness.  Using nebulizer up to 3-4x daily. Has not use ProAir inhaler, states "I can't get that thing to work and I just got it." Uses 2L PRN O2 at home.

## 2014-10-10 NOTE — ED Notes (Signed)
Spouse at nurses station asking how much longer they will be here and when will the MD be in to talk with them. MD notified.

## 2014-10-10 NOTE — ED Provider Notes (Signed)
CSN: [id number]     Arrival date & time 10/10/14  1601 History  This chart was scribed for Nat Christen, MD by Randa Evens, ED Scribe. This patient was seen in room APA02/APA02 and the patient's care was started at 9:18 PM.      Chief Complaint  Patient presents with  . Shortness of Breath   Patient is a 80 y.o. male presenting with shortness of breath. The history is provided by the patient. No language interpreter was used.  Shortness of Breath Associated symptoms: no chest pain    HPI Comments: Edward [name] is a 80 y.o. male who presents to the Emergency Department complaining of SOB onset 3 weeks prior. Pt states that he is on 2L of O2 as needed at home. Pt states that he is able to walk about 100 ft before felling tired and more SOB. Pt states that he has been using his home nebulizer several times over the past few days with min relief. Denies chest pain or other related symptoms.  Pt states that he is former smoker that he recently quit 2 years ago.   Past Medical History  Diagnosis Date  . Diabetes mellitus   . COPD (chronic obstructive pulmonary disease)   . Hypertension   . Allergy     Rhinitis  . Elevated lipids   . Pulmonary fibrosis   . Bronchitis   . Colon polyps   . Vitamin D deficiency   . PSA elevation   . Chronic respiratory failure   . Hypercholesterolemia    Past Surgical History  Procedure Laterality Date  . Cataract extraction w/phaco  06/25/2012    Procedure: CATARACT EXTRACTION PHACO AND INTRAOCULAR LENS PLACEMENT (IOC);  Surgeon: Elta Guadeloupe T. Gershon Crane, MD;  Location: [facility];  Service: Ophthalmology;  Laterality: Left;  CDE=19.01  . Cataract extraction w/phaco  07/09/2012    Procedure: CATARACT EXTRACTION PHACO AND INTRAOCULAR LENS PLACEMENT (IOC);  Surgeon: Elta Guadeloupe T. Gershon Crane, MD;  Location: [facility];  Service: Ophthalmology;  Laterality: Right;  CDE: 20.09   Family History  Problem Relation Age of Onset  . CAD Other   . Diabetes Other   . Heart disease Mother     History  Substance Use Topics  . Smoking status: Former Smoker -- 1.50 packs/day for 60 years    Types: Cigarettes    Quit date: 12/31/2012  . Smokeless tobacco: Never Used  . Alcohol Use: No    Review of Systems  Respiratory: Positive for shortness of breath.   Cardiovascular: Negative for chest pain.    Allergies  Ace inhibitors  Home Medications   Prior to Admission medications   Medication Sig Start Date End Date Taking? Authorizing Provider  albuterol (PROVENTIL) (2.5 MG/3ML) 0.083% nebulizer solution Take 3 mLs (2.5 mg total) by nebulization every 6 (six) hours as needed for wheezing or shortness of breath. 09/03/14  Yes Mary B Dixon, PA-C  Albuterol Sulfate (PROAIR RESPICLICK) 329 (90 BASE) MCG/ACT AEPB Inhale 1 puff into the lungs every 6 (six) hours.   Yes Historical Provider, MD  aspirin 81 MG EC tablet TAKE 1 TABLET BY MOUTH ONCE DAILY 09/04/14  Yes Orlena Sheldon, PA-C  Insulin Detemir (LEVEMIR FLEXTOUCH) 100 UNIT/ML Pen Inject 20 units at bedtime Patient taking differently: 20 Units by Subconjunctival route at bedtime. Inject 20 units at bedtime 05/12/14  Yes Alycia Rossetti, MD  losartan (COZAAR) 100 MG tablet TAKE 1 TABLET BY MOUTH ONCE DAILY 09/04/14  Yes Orlena Sheldon, PA-C  metFORMIN (GLUCOPHAGE) 1000 MG tablet TAKE 1 TABLET BY MOUTH 2 TIMES A DAY 09/04/14  Yes Orlena Sheldon, PA-C  Omega-3 Fatty Acids (FISH OIL) 1000 MG CAPS Take 1 capsule by mouth 2 (two) times daily.    Yes Historical Provider, MD  pravastatin (PRAVACHOL) 80 MG tablet TAKE 1 TABLET BY MOUTH ONCE DAILY AT BEDTIME 09/04/14  Yes Orlena Sheldon, PA-C  SYMBICORT 160-4.5 MCG/ACT inhaler INHALE 2 PUFFS INTO LUNGS TWICE A DAY Patient not taking: Reported on 10/14/2014 09/04/14  Yes Mary B Dixon, PA-C  EASY TOUCH PEN NEEDLES 31G X 8 MM MISC USE 1 PEN NEEDLE ONCE DAILY AS DIRECTED 09/04/14   Orlena Sheldon, PA-C  LANTUS SOLOSTAR 100 UNIT/ML Solostar Pen START INJECTING 15 UNITS UNDER THE SKIN EACH NIGHT, INCREASE BY  ONE UNIT EACH NIGHT AS DIRECTED UP TO 40 UNITS EACH NIGHT Patient not taking: Reported on 09/24/2014 09/04/14   Orlena Sheldon, PA-C  levofloxacin (LEVAQUIN) 500 MG tablet Take 1 tablet (500 mg total) by mouth daily. 10/10/14   Nat Christen, MD  loratadine (ALLERGY RELIEF) 10 MG tablet Take 10 mg by mouth at bedtime as needed for allergies.    Historical Provider, MD  ONE TOUCH ULTRA TEST test strip CHECK FASTING BLOOD SUGAR TWICE DAILY 09/04/14   Orlena Sheldon, PA-C  ONETOUCH DELICA LANCETS 70W MISC USE TO CHECK BLOOD SUGAR TWICE DAILY AS DIRECTED 09/04/14   Orlena Sheldon, PA-C  predniSONE (DELTASONE) 50 MG tablet 1 tablet daily for 4 days, one half tablet daily for 4 days 10/10/14   Nat Christen, MD  SPIRIVA HANDIHALER 18 MCG inhalation capsule INHALE CONTENTS OF 1 CAPSULE VIA HANDIHALER ONCE A DAY Patient not taking: Reported on 09/24/2014 09/04/14   Orlena Sheldon, PA-C   Triage Vitals: BP 187/85 mmHg  Pulse 91  Temp(Src) 99 F (37.2 C) (Core (Comment))  Resp 25  SpO2 95%  Physical Exam  Constitutional: He is oriented to person, place, and time. He appears well-developed and well-nourished.  HENT:  Head: Normocephalic and atraumatic.  Eyes: Conjunctivae and EOM are normal. Pupils are equal, round, and reactive to light.  Neck: Normal range of motion. Neck supple.  Cardiovascular: Normal rate and regular rhythm.   Pulmonary/Chest: Effort normal. Tachypnea noted. He has wheezes.  Slightly tachypneic and dyspneic, bilateral expiratory wheezing.   Abdominal: Soft. Bowel sounds are normal.  Musculoskeletal: Normal range of motion.  Neurological: He is alert and oriented to person, place, and time.  Skin: Skin is warm and dry.  Psychiatric: He has a normal mood and affect. His behavior is normal.  Nursing note and vitals reviewed.   ED Course  Procedures (including critical care time) DIAGNOSTIC STUDIES: Oxygen Saturation is 95% on [place], adequate by my interpretation.    COORDINATION OF CARE: 9:24  PM-Discussed treatment plan which includes breathing treatment and CXR with pt at bedside and pt agreed to plan.     Labs Review Labs Reviewed  CBC WITH DIFFERENTIAL - Abnormal; Notable for the following:    Eosinophils Relative 10 (*)    All other components within normal limits  BASIC METABOLIC PANEL - Abnormal; Notable for the following:    Sodium 134 (*)    Glucose, Bld 398 (*)    GFR calc non Af Amer 70 (*)    GFR calc Af Amer 81 (*)    All other components within normal limits  TROPONIN I    Imaging Review No results found.  EKG Interpretation None      Date: 10/10/13  Rate: 97  Rhythm: normal sinus rhythm  QRS Axis: normal  Intervals: normal  ST/T Wave abnormalities: normal  Conduction Disutrbances: none  Narrative Interpretation: unremarkable    MDM   Final diagnoses:  COPD exacerbation   Patient has known COPD. He feels better after nebulizer treatment. IV Solu-Medrol. Patient states that he feels he is able to go home. He is currently on prednisone. Discharge antibiotic Levaquin. Follow up with his primary care doctor.   I personally performed the services described in this documentation, which was scribed in my presence. The recorded information has been reviewed and is accurate.      Nat Christen, MD 10/17/14 [phone]

## 2014-10-10 NOTE — ED Notes (Signed)
MD at bedside. 

## 2014-10-10 NOTE — Discharge Instructions (Signed)
Chest x-ray showed no pneumonia. Antibiotic and prednisone. Use your home medications. Follow-up your primary care doctor next week

## 2014-10-10 NOTE — ED Notes (Signed)
Patient denies offer of using O2 at present.

## 2014-10-14 ENCOUNTER — Ambulatory Visit (INDEPENDENT_AMBULATORY_CARE_PROVIDER_SITE_OTHER): Payer: 59 | Admitting: Physician Assistant

## 2014-10-14 ENCOUNTER — Encounter: Payer: Self-pay | Admitting: Physician Assistant

## 2014-10-14 VITALS — BP 136/76 | HR 80 | Temp 97.7°F | Resp 20 | Wt 160.0 lb

## 2014-10-14 DIAGNOSIS — J8410 Pulmonary fibrosis, unspecified: Secondary | ICD-10-CM

## 2014-10-14 DIAGNOSIS — J439 Emphysema, unspecified: Secondary | ICD-10-CM

## 2014-10-14 DIAGNOSIS — J40 Bronchitis, not specified as acute or chronic: Secondary | ICD-10-CM

## 2014-10-14 NOTE — Progress Notes (Signed)
Patient ID: Edward [name] MRN: [id number], DOB: [birth year], 80 y.o. Date of Encounter: 10/14/2014, 8:39 AM    Chief Complaint:  Chief Complaint  Patient presents with  . ED follow up    has been ED x 3 since Christmas, breathing problems     HPI: 80 y.o. year old male presents for follow-up after multiple recent ER visits secondary to COPD exacerbations.  He had ER visit on 09/02/14. Treated with IV steroids, continuous nebulizer. Was discharged on doxycycline for presumed bronchitis and also an oral prednisone taper. They noted that he Arty had oxygen at home to use if needed. At that visit chest x-ray showed no acute disease. BNP was negative.  ER visit 09/24/14 he was treated with prednisone and Levaquin.  ER visit 10/10/14 he was treated in the ER with nebulizer and IV Solu-Medrol.  Today patient states that his breathing is pretty much at baseline. At home he currently hasread and no Symbicort and he has not had these at home over the last several months. Cost has always been an issue in this is been a chronic problem. Currently the only medication he has at home for his COPD is albuterol. He has an albuterol MDI/HFA and he did bring this in with him today. He also has albuterol for nebulizer. However he says that this always runs out way too soon. Says that the direction say he can use it every 6 hours as needed but says that he only gets 25 little vials and at this always runs out way too soon.     Home Meds:   Outpatient Prescriptions Prior to Visit  Medication Sig Dispense Refill  . albuterol (PROVENTIL) (2.5 MG/3ML) 0.083% nebulizer solution Take 3 mLs (2.5 mg total) by nebulization every 6 (six) hours as needed for wheezing or shortness of breath. 75 mL 5  . Albuterol Sulfate (PROAIR RESPICLICK) 034 (90 BASE) MCG/ACT AEPB Inhale 1 puff into the lungs every 6 (six) hours.    Marland Kitchen aspirin 81 MG EC tablet TAKE 1 TABLET BY MOUTH ONCE DAILY 30 tablet 5  . EASY TOUCH PEN  NEEDLES 31G X 8 MM MISC USE 1 PEN NEEDLE ONCE DAILY AS DIRECTED 100 each 5  . levofloxacin (LEVAQUIN) 500 MG tablet Take 1 tablet (500 mg total) by mouth daily. 10 tablet 0  . loratadine (ALLERGY RELIEF) 10 MG tablet Take 10 mg by mouth at bedtime as needed for allergies.    Marland Kitchen losartan (COZAAR) 100 MG tablet TAKE 1 TABLET BY MOUTH ONCE DAILY 30 tablet 5  . metFORMIN (GLUCOPHAGE) 1000 MG tablet TAKE 1 TABLET BY MOUTH 2 TIMES A DAY 60 tablet 5  . Omega-3 Fatty Acids (FISH OIL) 1000 MG CAPS Take 1 capsule by mouth 2 (two) times daily.     . ONE TOUCH ULTRA TEST test strip CHECK FASTING BLOOD SUGAR TWICE DAILY 50 each 5  . ONETOUCH DELICA LANCETS 91P MISC USE TO CHECK BLOOD SUGAR TWICE DAILY AS DIRECTED 100 each 5  . pravastatin (PRAVACHOL) 80 MG tablet TAKE 1 TABLET BY MOUTH ONCE DAILY AT BEDTIME 30 tablet 5  . predniSONE (DELTASONE) 50 MG tablet 1 tablet daily for 4 days, one half tablet daily for 4 days 6 tablet 1  . Insulin Detemir (LEVEMIR FLEXTOUCH) 100 UNIT/ML Pen Inject 20 units at bedtime (Patient taking differently: 20 Units by Subconjunctival route at bedtime. Inject 20 units at bedtime) 15 mL 11  . LANTUS SOLOSTAR 100 UNIT/ML Solostar Pen START INJECTING  15 UNITS UNDER THE SKIN EACH NIGHT, INCREASE BY ONE UNIT EACH NIGHT AS DIRECTED UP TO 40 UNITS EACH NIGHT (Patient not taking: Reported on 09/24/2014) 15 mL 5  . SPIRIVA HANDIHALER 18 MCG inhalation capsule INHALE CONTENTS OF 1 CAPSULE VIA HANDIHALER ONCE A DAY (Patient not taking: Reported on 09/24/2014) 30 capsule 5  . SYMBICORT 160-4.5 MCG/ACT inhaler INHALE 2 PUFFS INTO LUNGS TWICE A DAY (Patient not taking: Reported on 10/14/2014) 10.2 g 5  . doxycycline (VIBRAMYCIN) 100 MG capsule Take 1 capsule (100 mg total) by mouth 2 (two) times daily. (Patient not taking: Reported on 09/24/2014) 20 capsule 0  . PROAIR HFA 108 (90 BASE) MCG/ACT inhaler INHALE 2 PUFFS INTO LUNGS EVERY 6 HOURS AS NEEDED FOR WHEEZING OR SHORTNESS OF BREATH (Patient not  taking: Reported on 10/10/2014) 8.5 g 5   No facility-administered medications prior to visit.    Allergies:  Allergies  Allergen Reactions  . Ace Inhibitors     Hyperkalemia--07/23/2013      Review of Systems: See HPI for pertinent ROS. All other ROS negative.    Physical Exam: Blood pressure 136/76, pulse 80, temperature 97.7 F (36.5 C), temperature source Oral, resp. rate 20, weight 160 lb (72.576 kg), SpO2 94 %., Body mass index is 31.25 kg/(m^2). General:  WNWD WM. Appears in no acute distress. Lungs: He has distant, decreased breath sounds throughout. However I hear no wheezes or rhonchi at present. Heart: Regular rhythm. No murmurs, rubs, or gallops. Msk:  Strength and tone normal for age. Extremities/Skin: Warm and dry. Neuro: Alert and oriented X 3. Moves all extremities spontaneously. Gait is normal. CNII-XII grossly in tact. Psych:  Responds to questions appropriately with a normal affect.     ASSESSMENT AND PLAN:  80 y.o. year old male with  1. Pulmonary emphysema, unspecified emphysema type  2. Pulmonary fibrosis  3. Bronchitis   In the past we have contacted [facility] and he had been participating with [facility] in the past at one point. As well he has gotten medications through the [facility]. We will follow-up on these financial issues. We have to get it to where he can afford to take his daily medications every day and that would avoid a lot of these ER visits and COPD exacerbations.   [address] [place], [place], [facility] 10/14/2014 8:39 AM

## 2014-10-18 DIAGNOSIS — J449 Chronic obstructive pulmonary disease, unspecified: Secondary | ICD-10-CM | POA: Diagnosis not present

## 2014-10-26 ENCOUNTER — Telehealth: Payer: Self-pay | Admitting: Family Medicine

## 2014-10-26 NOTE — Telephone Encounter (Signed)
Diabetic nurse with patient's insurance.  Trying to help him with Diabetic education and treatment.  Wanted to know last BP reading and A1C.  Values given to her.  She was telling me he is difficult to deal with.  Issue always "he can not afford"  When it comes to his medications and specialist appts.  I told her we were aware of this.

## 2014-10-29 ENCOUNTER — Telehealth: Payer: Self-pay | Admitting: Family Medicine

## 2014-10-29 NOTE — Telephone Encounter (Signed)
Can send Rx for Budesonide Respules 0.5 mg / 2 mL unit dose---BID. Says send Rx for albuterol nebulizer solution.

## 2014-10-29 NOTE — Telephone Encounter (Signed)
Trying to find out why pt not getting and using his Spiriva or Symbicort.  Per the PPA, even though he qualifies for low income.  Certain meds, unfortunately, do not qualify and pt must reach a certain deductible before they are covered under his part D.  BUT  If we order same meds in nebulizer form, we can order through DME company and they will bill MCR part B.  This will pay for them and not effect his other medications.  Please advise.

## 2014-10-30 MED ORDER — BUDESONIDE 0.5 MG/2ML IN SUSP: 0.5000 mg | Freq: Two times a day (BID) | RESPIRATORY_TRACT | Status: DC

## 2014-10-30 NOTE — Telephone Encounter (Signed)
Pt called back and I explained getting his Nebulizer medications through his Medicare Part B.  Rx to [facility] to supply pt.  Pt told to use as directed on package.  This will replace the Symbicort and Spiriva he can afford under Part D

## 2014-11-08 ENCOUNTER — Encounter (HOSPITAL_COMMUNITY): Payer: Self-pay | Admitting: Emergency Medicine

## 2014-11-08 ENCOUNTER — Emergency Department (HOSPITAL_COMMUNITY)
Admission: EM | Admit: 2014-11-08 | Discharge: 2014-11-08 | Disposition: A | Discharge status: Home / Self Care | Payer: Medicare Other | Attending: Emergency Medicine | Admitting: Emergency Medicine

## 2014-11-08 ENCOUNTER — Emergency Department (HOSPITAL_COMMUNITY): Payer: Medicare Other

## 2014-11-08 DIAGNOSIS — R0602 Shortness of breath: Secondary | ICD-10-CM | POA: Diagnosis not present

## 2014-11-08 DIAGNOSIS — Z794 Long term (current) use of insulin: Secondary | ICD-10-CM | POA: Diagnosis not present

## 2014-11-08 DIAGNOSIS — J449 Chronic obstructive pulmonary disease, unspecified: Secondary | ICD-10-CM | POA: Diagnosis not present

## 2014-11-08 DIAGNOSIS — E780 Pure hypercholesterolemia: Secondary | ICD-10-CM | POA: Insufficient documentation

## 2014-11-08 DIAGNOSIS — E785 Hyperlipidemia, unspecified: Secondary | ICD-10-CM | POA: Insufficient documentation

## 2014-11-08 DIAGNOSIS — Z7982 Long term (current) use of aspirin: Secondary | ICD-10-CM | POA: Insufficient documentation

## 2014-11-08 DIAGNOSIS — Z79899 Other long term (current) drug therapy: Secondary | ICD-10-CM | POA: Diagnosis not present

## 2014-11-08 DIAGNOSIS — Z7951 Long term (current) use of inhaled steroids: Secondary | ICD-10-CM | POA: Diagnosis not present

## 2014-11-08 DIAGNOSIS — J441 Chronic obstructive pulmonary disease with (acute) exacerbation: Secondary | ICD-10-CM

## 2014-11-08 DIAGNOSIS — I10 Essential (primary) hypertension: Secondary | ICD-10-CM | POA: Diagnosis not present

## 2014-11-08 DIAGNOSIS — Z87891 Personal history of nicotine dependence: Secondary | ICD-10-CM | POA: Insufficient documentation

## 2014-11-08 DIAGNOSIS — E119 Type 2 diabetes mellitus without complications: Secondary | ICD-10-CM | POA: Diagnosis not present

## 2014-11-08 DIAGNOSIS — Z86010 Personal history of colonic polyps: Secondary | ICD-10-CM | POA: Diagnosis not present

## 2014-11-08 HISTORY — DX: Dependence on supplemental oxygen: Z99.81

## 2014-11-08 LAB — COMPREHENSIVE METABOLIC PANEL
ALT: 15 U/L (ref 0–53)
AST: 17 U/L (ref 0–37)
Albumin: 3.9 g/dL (ref 3.5–5.2)
Alkaline Phosphatase: 68 U/L (ref 39–117)
Anion gap: 7 (ref 5–15)
BUN: 12 mg/dL (ref 6–23)
CO2: 30 mmol/L (ref 19–32)
CREATININE: 0.81 mg/dL (ref 0.50–1.35)
Calcium: 8.8 mg/dL (ref 8.4–10.5)
Chloride: 95 mmol/L — ABNORMAL LOW (ref 96–112)
GFR calc Af Amer: >90 mL/min (ref >90)
GFR calc non Af Amer: 84 mL/min — ABNORMAL LOW (ref >90)
GLUCOSE: 347 mg/dL — AB (ref 70–99)
POTASSIUM: 4.4 mmol/L (ref 3.5–5.1)
Sodium: 132 mmol/L — ABNORMAL LOW (ref 135–145)
Total Bilirubin: 0.4 mg/dL (ref 0.3–1.2)
Total Protein: 6.5 g/dL (ref 6.0–8.3)

## 2014-11-08 LAB — TROPONIN I

## 2014-11-08 LAB — CBC WITH DIFFERENTIAL/PLATELET
BASOS ABS: 0.1 10*3/uL (ref 0.0–0.1)
Basophils Relative: 1 % (ref 0–1)
EOS ABS: 0.7 10*3/uL (ref 0.0–0.7)
EOS PCT: 12 % — AB (ref 0–5)
HCT: 42.3 % (ref 39.0–52.0)
Hemoglobin: 13.5 g/dL (ref 13.0–17.0)
LYMPHS PCT: 26 % (ref 12–46)
Lymphs Abs: 1.4 10*3/uL (ref 0.7–4.0)
MCH: 27.6 pg (ref 26.0–34.0)
MCHC: 31.9 g/dL (ref 30.0–36.0)
MCV: 86.3 fL (ref 78.0–100.0)
Monocytes Absolute: 0.5 10*3/uL (ref 0.1–1.0)
Monocytes Relative: 9 % (ref 3–12)
NEUTROS PCT: 52 % (ref 43–77)
Neutro Abs: 2.9 10*3/uL (ref 1.7–7.7)
PLATELETS: 280 10*3/uL (ref 150–400)
RBC: 4.9 MIL/uL (ref 4.22–5.81)
RDW: 14.5 % (ref 11.5–15.5)
WBC: 5.6 10*3/uL (ref 4.0–10.5)

## 2014-11-08 MED ORDER — METHYLPREDNISOLONE SODIUM SUCC 125 MG IJ SOLR
125.0000 mg | Freq: Once | INTRAMUSCULAR | Status: AC
  Administered 2014-11-08: 125 mg via INTRAVENOUS
  Filled 2014-11-08: qty 2

## 2014-11-08 MED ORDER — ALBUTEROL (5 MG/ML) CONTINUOUS INHALATION SOLN
10.0000 mg/h | INHALATION_SOLUTION | Freq: Once | RESPIRATORY_TRACT | Status: AC
  Administered 2014-11-08: 10 mg/h via RESPIRATORY_TRACT
  Filled 2014-11-08: qty 20

## 2014-11-08 MED ORDER — ALBUTEROL SULFATE (2.5 MG/3ML) 0.083% IN NEBU: 2.5000 mg | INHALATION_SOLUTION | RESPIRATORY_TRACT | Status: DC | PRN

## 2014-11-08 MED ORDER — ALBUTEROL SULFATE HFA 108 (90 BASE) MCG/ACT IN AERS
2.0000 | INHALATION_SPRAY | RESPIRATORY_TRACT | Status: AC
  Administered 2014-11-08: 2 via RESPIRATORY_TRACT
  Filled 2014-11-08 (×2): qty 6.7

## 2014-11-08 MED ORDER — ALBUTEROL SULFATE (2.5 MG/3ML) 0.083% IN NEBU: 2.5000 mg | INHALATION_SOLUTION | Freq: Once | RESPIRATORY_TRACT | Status: DC

## 2014-11-08 MED ORDER — IPRATROPIUM BROMIDE 0.02 % IN SOLN
1.0000 mg | Freq: Once | RESPIRATORY_TRACT | Status: AC
  Administered 2014-11-08: 1 mg via RESPIRATORY_TRACT
  Filled 2014-11-08: qty 5

## 2014-11-08 MED ORDER — IPRATROPIUM-ALBUTEROL 0.5-2.5 (3) MG/3ML IN SOLN: 3.0000 mL | Freq: Once | RESPIRATORY_TRACT | Status: DC

## 2014-11-08 MED ORDER — DOXYCYCLINE HYCLATE 100 MG PO TABS: 100.0000 mg | ORAL_TABLET | Freq: Two times a day (BID) | ORAL | Status: DC

## 2014-11-08 MED ORDER — PREDNISONE 20 MG PO TABS: 40.0000 mg | ORAL_TABLET | Freq: Every day | ORAL | Status: DC

## 2014-11-08 NOTE — ED Notes (Signed)
Patient ambulated in hallway with 2L/[place] (which patient says he uses at home), oxygen saturation 90%, pt denies SOB with ambulation.

## 2014-11-08 NOTE — ED Notes (Signed)
Pt given discharge instructions, prescriptions, albuterol inhaler, as well as nasal canal. No questions VSS NAD

## 2014-11-08 NOTE — ED Provider Notes (Signed)
CSN: [id number]     Arrival date & time 11/08/14  1540 History   First MD Initiated Contact with Patient 11/08/14 1555     Chief Complaint  Patient presents with  . Shortness of Breath     HPI Pt was seen at 1555.  Per pt, c/o gradual onset and worsening of persistent cough, wheezing and SOB for the past 1 to 2 months. Has been using home O2 N/C, MDI and nebs without relief.  Denies CP/palpitations, no back pain, no abd pain, no N/V/D, no fevers, no rash.     Past Medical History  Diagnosis Date  . Diabetes mellitus   . COPD (chronic obstructive pulmonary disease)   . Hypertension   . Allergy     Rhinitis  . Elevated lipids   . Pulmonary fibrosis   . Bronchitis   . Colon polyps   . Vitamin D deficiency   . PSA elevation   . Chronic respiratory failure   . Hypercholesterolemia   . On home O2     2L N/C prn   Past Surgical History  Procedure Laterality Date  . Cataract extraction w/phaco  06/25/2012    Procedure: CATARACT EXTRACTION PHACO AND INTRAOCULAR LENS PLACEMENT (IOC);  Surgeon: Elta Guadeloupe T. Gershon Crane, MD;  Location: [facility];  Service: Ophthalmology;  Laterality: Left;  CDE=19.01  . Cataract extraction w/phaco  07/09/2012    Procedure: CATARACT EXTRACTION PHACO AND INTRAOCULAR LENS PLACEMENT (IOC);  Surgeon: Elta Guadeloupe T. Gershon Crane, MD;  Location: [facility];  Service: Ophthalmology;  Laterality: Right;  CDE: 20.09   Family History  Problem Relation Age of Onset  . CAD Other   . Diabetes Other   . Heart disease Mother    History  Substance Use Topics  . Smoking status: Former Smoker -- 1.50 packs/day for 60 years    Types: Cigarettes    Quit date: 12/31/2012  . Smokeless tobacco: Never Used  . Alcohol Use: No    Review of Systems ROS: Statement: All systems negative except as marked or noted in the HPI; Constitutional: Negative for fever and chills. ; ; Eyes: Negative for eye pain, redness and discharge. ; ; ENMT: Negative for ear pain, hoarseness, nasal congestion, sinus pressure  and sore throat. ; ; Cardiovascular: Negative for chest pain, palpitations, diaphoresis, and peripheral edema. ; ; Respiratory: +cough, wheezing, SOB. Negative for stridor. ; ; Gastrointestinal: Negative for nausea, vomiting, diarrhea, abdominal pain, blood in stool, hematemesis, jaundice and rectal bleeding. . ; ; Genitourinary: Negative for dysuria, flank pain and hematuria. ; ; Musculoskeletal: Negative for back pain and neck pain. Negative for swelling and trauma.; ; Skin: Negative for pruritus, rash, abrasions, blisters, bruising and skin lesion.; ; Neuro: Negative for headache, lightheadedness and neck stiffness. Negative for weakness, altered level of consciousness , altered mental status, extremity weakness, paresthesias, involuntary movement, seizure and syncope.        Allergies  Ace inhibitors  Home Medications   Prior to Admission medications   Medication Sig Start Date End Date Taking? Authorizing Provider  albuterol (PROVENTIL) (2.5 MG/3ML) 0.083% nebulizer solution Take 3 mLs (2.5 mg total) by nebulization every 6 (six) hours as needed for wheezing or shortness of breath. 09/03/14   Lonie Peak Dixon, PA-C  Albuterol Sulfate (PROAIR RESPICLICK) 024 (90 BASE) MCG/ACT AEPB Inhale 1 puff into the lungs every 6 (six) hours.    Historical Provider, MD  aspirin 81 MG EC tablet TAKE 1 TABLET BY MOUTH ONCE DAILY 09/04/14   Lonie Peak  Dixon, PA-C  budesonide (PULMICORT) 0.5 MG/2ML nebulizer solution Take 2 mLs (0.5 mg total) by nebulization 2 (two) times daily. 10/30/14   Mary B Dixon, PA-C  EASY TOUCH PEN NEEDLES 31G X 8 MM MISC USE 1 PEN NEEDLE ONCE DAILY AS DIRECTED 09/04/14   Orlena Sheldon, PA-C  Insulin Detemir (LEVEMIR FLEXTOUCH) 100 UNIT/ML Pen Inject 20 units at bedtime Patient taking differently: 20 Units by Subconjunctival route at bedtime. Inject 20 units at bedtime 05/12/14   Alycia Rossetti, MD  LANTUS SOLOSTAR 100 UNIT/ML Solostar Pen START INJECTING 15 UNITS UNDER THE SKIN EACH NIGHT,  INCREASE BY ONE UNIT EACH NIGHT AS DIRECTED UP TO 40 UNITS EACH NIGHT Patient not taking: Reported on 09/24/2014 09/04/14   Orlena Sheldon, PA-C  levofloxacin (LEVAQUIN) 500 MG tablet Take 1 tablet (500 mg total) by mouth daily. 10/10/14   Nat Christen, MD  loratadine (ALLERGY RELIEF) 10 MG tablet Take 10 mg by mouth at bedtime as needed for allergies.    Historical Provider, MD  losartan (COZAAR) 100 MG tablet TAKE 1 TABLET BY MOUTH ONCE DAILY 09/04/14   Orlena Sheldon, PA-C  metFORMIN (GLUCOPHAGE) 1000 MG tablet TAKE 1 TABLET BY MOUTH 2 TIMES A DAY 09/04/14   Orlena Sheldon, PA-C  Omega-3 Fatty Acids (FISH OIL) 1000 MG CAPS Take 1 capsule by mouth 2 (two) times daily.     Historical Provider, MD  ONE TOUCH ULTRA TEST test strip CHECK FASTING BLOOD SUGAR TWICE DAILY 09/04/14   Orlena Sheldon, PA-C  ONETOUCH DELICA LANCETS 53G MISC USE TO CHECK BLOOD SUGAR TWICE DAILY AS DIRECTED 09/04/14   Orlena Sheldon, PA-C  pravastatin (PRAVACHOL) 80 MG tablet TAKE 1 TABLET BY MOUTH ONCE DAILY AT BEDTIME 09/04/14   Orlena Sheldon, PA-C  predniSONE (DELTASONE) 50 MG tablet 1 tablet daily for 4 days, one half tablet daily for 4 days 10/10/14   Nat Christen, MD   BP 156/79 mmHg  Pulse 92  Temp(Src) 97.9 F (36.6 C) (Oral)  Resp 24  Ht 5\' 6"  (1.676 m)  Wt 160 lb (72.576 kg)  BMI 25.84 kg/m2  SpO2 91% Physical Exam  1600; Physical examination:  Nursing notes reviewed; Vital signs and O2 SAT reviewed;  Constitutional: Well developed, Well nourished, Well hydrated, Uncomfortable appearing.; Head:  Normocephalic, atraumatic; Eyes: EOMI, PERRL, No scleral icterus; ENMT: Mouth and pharynx normal, Mucous membranes moist; Neck: Supple, Full range of motion, No lymphadenopathy; Cardiovascular: Regular rate and rhythm, No gallop; Respiratory: Breath sounds diminished & equal bilaterally, insp/exp wheezes bilat with occasional audible wheezing.  Speaking long phrases. Tachypneic, sitting upright. No retrax or access mm use.; Chest: Nontender,  Movement normal; Abdomen: Soft, Nontender, Nondistended, Normal bowel sounds; Genitourinary: No CVA tenderness; Extremities: Pulses normal, No tenderness, No edema, No calf edema or asymmetry.; Neuro: AA&Ox3, Major CN grossly intact.  Speech clear. No gross focal motor or sensory deficits in extremities.; Skin: Color normal, Warm, Dry.   ED Course  Procedures     EKG Interpretation None      MDM  MDM Reviewed: previous chart, nursing note and vitals Reviewed previous: labs and ECG Interpretation: labs, ECG and x-ray      Date: 11/08/2014  Rate: 93  Rhythm: normal sinus rhythm  QRS Axis: normal  Intervals: normal  ST/T Wave abnormalities: normal  Conduction Disutrbances:none  Narrative Interpretation: Artifact  Old EKG Reviewed: unchanged; no significant changes from previous EKG dated 10/10/2014.   Results for orders placed or performed  during the hospital encounter of 11/08/14  CBC with Differential  Result Value Ref Range   WBC 5.6 4.0 - 10.5 K/uL   RBC 4.90 4.22 - 5.81 MIL/uL   Hemoglobin 13.5 13.0 - 17.0 g/dL   HCT 42.3 39.0 - 52.0 %   MCV 86.3 78.0 - 100.0 fL   MCH 27.6 26.0 - 34.0 pg   MCHC 31.9 30.0 - 36.0 g/dL   RDW 14.5 11.5 - 15.5 %   Platelets 280 150 - 400 K/uL   Neutrophils Relative % 52 43 - 77 %   Neutro Abs 2.9 1.7 - 7.7 K/uL   Lymphocytes Relative 26 12 - 46 %   Lymphs Abs 1.4 0.7 - 4.0 K/uL   Monocytes Relative 9 3 - 12 %   Monocytes Absolute 0.5 0.1 - 1.0 K/uL   Eosinophils Relative 12 (H) 0 - 5 %   Eosinophils Absolute 0.7 0.0 - 0.7 K/uL   Basophils Relative 1 0 - 1 %   Basophils Absolute 0.1 0.0 - 0.1 K/uL  Comprehensive metabolic panel  Result Value Ref Range   Sodium 132 (L) 135 - 145 mmol/L   Potassium 4.4 3.5 - 5.1 mmol/L   Chloride 95 (L) 96 - 112 mmol/L   CO2 30 19 - 32 mmol/L   Glucose, Bld 347 (H) 70 - 99 mg/dL   BUN 12 6 - 23 mg/dL   Creatinine, Ser 0.81 0.50 - 1.35 mg/dL   Calcium 8.8 8.4 - 10.5 mg/dL   Total Protein 6.5 6.0  - 8.3 g/dL   Albumin 3.9 3.5 - 5.2 g/dL   AST 17 0 - 37 U/L   ALT 15 0 - 53 U/L   Alkaline Phosphatase 68 39 - 117 U/L   Total Bilirubin 0.4 0.3 - 1.2 mg/dL   GFR calc non Af Amer 84 (L) >90 mL/min   GFR calc Af Amer >90 >90 mL/min   Anion gap 7 5 - 15  Troponin I  Result Value Ref Range   Troponin I <0.03 <0.031 ng/mL   Dg Chest 2 View 11/08/2014   CLINICAL DATA:  One month history of shortness of breath which has progressively worsened over the past 1 week. Prior 100+ pack-year smoking history, quit approximately 2 years ago. Current history of hypertension, diabetes and COPD.  EXAM: CHEST  2 VIEW  COMPARISON:  10/10/2014 dating back to 06/21/2012.  FINDINGS: Cardiac silhouette normal in size, unchanged. Thoracic aorta atherosclerotic, unchanged. Hilar and mediastinal contours otherwise unremarkable. Stable hyperinflation. Prominent bronchovascular markings in the left lower lobe, unchanged. No new pulmonary parenchymal abnormalities. No pleural effusions. No pneumothorax. Mild degenerative changes involving the thoracic spine.  IMPRESSION: Stable the hyperinflation consistent with COPD and/or asthma. No acute cardiopulmonary disease.   Electronically Signed   By: Evangeline Dakin M.D.   On: 11/08/2014 16:54    1900:  Pt states he "feels better" after hour long neb and IV steroid.  NAD, lungs coarse bilat with scattered exp wheezing, no further audible wheezing, resps easy, speaking full sentences, Sats 95-97% on O2 2L N/C.  Pt ambulated around the ED with Sats remaining 90 % on O2 2L N/C, resps without distress, and pt denies SOB. Pt states he wants to go home now. Admission offered. Pt states he wants to go home. Endorses he has his home O2 N/C, but needs an albuterol MDI and albuterol neb soln (rx). Will dose MDI here and rx neb solution. Dx and testing d/w pt and family.  Questions answered.  Verb understanding, agreeable to d/c home with outpt f/u.    Francine Graven, DO 11/11/14 2000

## 2014-11-08 NOTE — Discharge Instructions (Signed)
°Emergency Department Resource Guide °1) Find a Doctor and Pay Out of Pocket °Although you won't have to find out who is covered by your insurance plan, it is a good idea to ask around and get recommendations. You will then need to call the office and see if the doctor you have chosen will accept you as a new patient and what types of options they offer for patients who are self-pay. Some doctors offer discounts or will set up payment plans for their patients who do not have insurance, but you will need to ask so you aren't surprised when you get to your appointment. ° °2) Contact Your Local Health Department °Not all health departments have doctors that can see patients for sick visits, but many do, so it is worth a call to see if yours does. If you don't know where your local health department is, you can check in your phone book. The CDC also has a tool to help you locate your state's health department, and many state websites also have listings of all of their local health departments. ° °3) Find a Walk-in Clinic °If your illness is not likely to be very severe or complicated, you may want to try a walk in clinic. These are popping up all over the country in pharmacies, drugstores, and shopping centers. They're usually staffed by nurse practitioners or physician assistants that have been trained to treat common illnesses and complaints. They're usually fairly quick and inexpensive. However, if you have serious medical issues or chronic medical problems, these are probably not your best option. ° °No Primary Care Doctor: °- Call [facility] at  [phone] - they can help you locate a primary care doctor that  accepts your insurance, provides certain services, etc. °- Physician Referral Service- 1-800-533-3463 ° °Chronic Pain Problems: °Organization         Address  Phone   Notes  °[facility]  ([phone]) [phone] Patients need to be referred by their primary care doctor.  ° °Medication  Assistance: °Organization         Address  Phone   Notes  °[place] [facility] [address] °[place], [place] [address] (336) 641-8030 --Must be a resident of [place] °-- Must have NO insurance coverage whatsoever (no Medicaid/ Medicare, etc.) °-- The pt. MUST have a primary care doctor that directs their care regularly and follows them in the community °  °[facility]  (866) 331-1348   °[employer]  (888) 892-1162   ° °Agencies that provide inexpensive medical care: °Organization         Address  Phone   Notes  °[facility]  (336) 832-8035   °[facility]    (336) 832-7272   °[facility] [address] °[place], [place] [address] (336) 832-4777   °[facility] [address], °[place] (336) 271-4999   °[facility]    (336) 373-0678   °[facility]    (336) 272-1050   °[facility] ° [address], [place] Phone:  (336) 832-4444, Fax:  (336) 832-4440 Hours of Operation:  9 am - 6 pm, M-F.  Also accepts Medicaid/Medicare and self-pay.  °[facility] ° [address], [place] Phone: (336) [phone], Fax: (336) [phone]. Hours of Operation:  8:30 am - 5:30 pm, M-F.  Also accepts Medicaid and self-pay.  °[facility] [place] [address], [place] Phone: (336) 878-6027   °[facility] [address], [place], [place] (336)723-1848, Ext. 123 Mondays & Thursdays: 7-9 AM.  First 15 patients are seen on a first come, first serve basis. °  ° °Medicaid-accepting [place] Providers: ° °Organization         Address  Phone   Notes  °[facility] [address], [place] (336) 641-2100 Also accepts self-pay patients.  °[facility] [address], [place] ° (336) 856-9996   °[facility] [address], [place]  (336) 288-8857   °[facility] [address] [place] [address], [place] (336) 299-7000   °Veita Bland [address], [place]  ° (336) [phone] Only accepts [place] Access Medicaid patients after they have their name applied to their card.  ° °Self-Pay (no insurance) in [place]: ° °Organization         Address  Phone   Notes  °Sickle Cell Patients, [facility] [address], [place] (336) 832-1970   °[facility] [address], [place] (336) 832-4400   °[facility] ° [address],  (336) 992-4800   °[facility]/Dr. Osei-Bonsu ° [address] [place] [address], [place] or [address], [place] (336) [phone] Phone number for both [place] and [place] locations is the same.  °[facility] [address], [place] (336) 299-0000   °[facility] [address] [place] [address], [place] or [address] (336) 852-7530 °(336) 878-2260   °[facility] [address], [place] (336) 350-1642, phone; (336) 294-5005, fax Sees patients 1st and 3rd Saturday of every month.  Must not qualify for public or private insurance (i.e. Medicaid, Medicare, [place] Health Choice, Veterans' Benefits) • Household income should be no more than 200% of the poverty level •The clinic cannot treat you if you are pregnant or think you are pregnant • Sexually transmitted diseases are not treated at the clinic.  ° ° °Dental Care: °Organization         Address  Phone  Notes  °[place] [facility] [address], [place] (336) 641-6152 Accepts children up to age 21 who are enrolled in Medicaid or [place] Health Choice; pregnant women with a Medicaid card; and children who have applied for Medicaid or [place] Health Choice, but were declined, whose parents can pay a reduced fee at time of service.  °[place]  [facility] [place]  [address], [place] (336) 641-7733 Accepts children up to age 21 who are enrolled in Medicaid or [employer] Health Choice; pregnant women with a Medicaid card; and children who have applied for Medicaid or [place] Health Choice, but were declined, whose parents can pay a reduced fee at time of service.  °[facility] ° [address], [place] (336) 641-4533 Patients are seen by appointment only. Walk-ins are not accepted. [facility] will see patients 18 years of age and older. °Monday - Tuesday (8am-5pm) °Most Wednesdays (8:30-5pm) °$30 per visit, cash only  °[facility] ° [address], [place] (336) 641-4533 Patients are seen by appointment only. Walk-ins are not accepted. [facility] will see patients 18 years of age and older. °One   Wednesday Evening (Monthly: Volunteer Based).  $30 per visit, cash only  °[facility]  (919) 537-3737 for adults; Children under age 4, call Graduate Pediatric Dentistry at (919) [phone]. Children aged 4-14, please call (919) [phone] to request a pediatric application. ° Dental services are provided in all areas of dental care including fillings, crowns and bridges, complete and partial dentures, implants, gum treatment, root canals, and extractions. Preventive care is also provided. Treatment is provided to both adults and children. °Patients are selected via a lottery and there is often a waiting list. °  °[facility] [address], °[place] ° (336) 763-8833 [email or web address] °  °[facility] [address], [place], [place] (336)723-1848, Ext. 123 Second and Fourth Thursday of each month, opens at 6:30 AM; Clinic ends at 9 AM.  Patients are seen on a first-come first-served basis, and a limited number are seen during each clinic.  ° °[facility] ° [address], [place], [place] (336) [phone]    Eligibility Requirements °You must have lived in [place], [place], or [place] counties for at least the last three months. °  You cannot be eligible for state or federal sponsored healthcare insurance, including Veterans Administration, Medicaid, or Medicare. °  You generally cannot be eligible for healthcare insurance through your employer.  °  How to apply: °Eligibility screenings are held every Tuesday and Wednesday afternoon from 1:00 pm until 4:00 pm. You do not need an appointment for the interview!  °[facility] [address], [place], [place] 336-631-2330   °[facility] [facility]  336-342-8273   °[facility]  336-703-3100   °[facility]  336-570-6415   ° °Behavioral Health Resources in the Community: °Intensive Outpatient Programs °Organization         Address  Phone  Notes  °[facility] [address], [place], [place] 336-878-6098   °[facility] [address], [place], [place] 336-832-9800   °[facility] [address], [place], [place] ° 336-882-2125   °[facility] [address],  °[place], [place] 1-800-853-5163 or 336-641-4981   °Substance Abuse Resources °Organization         Address  Phone  Notes  °[facility]  336-882-2125   °[facility]  336-784-9470   °[facility]  336-285-9073   °[facility]  336-845-3988   °Residential & Outpatient Substance Abuse Program  1-800-659-3381   °Psychological Services °Organization         Address  Phone  Notes  °[facility]  336- 832-9600   °[facility]  336- 378-7881   °[facility] [address], [place] 1-800-853-5163 or 336-641-4981   ° °Mobile Crisis Teams °Organization         Address  Phone  Notes  °[facility], Mobile Crisis Care Unit  1-877-626-1772   °Assertive °[facility] ° [address]  [name], [place] 336-834-9664   °Sharon DeEsch [address] °[place] 336-554-5454   ° °Self-Help/Support Groups °Organization         Address  Phone             Notes  °[facility] - variety of support groups  336- 373-1402 Call for more information  °Narcotics Anonymous (NA), [facility] [address], °[place]  2 meetings at this location  ° °  Residential Treatment Programs °Organization         Address  Phone  Notes  °[facility] [address],    °[place]  1-866-801-8205   °[facility] ° [address], [place], [place] 704-293-8524   °[facility] [address], [place] 336-845-3988 Admissions: 8am-3pm M-F  °[facility] [address]    °[place], [place] 336-841-1104   °[facility] [address], [place], [place] 336-379-7146   °[facility] [address]  °[place], [place] 336-285-9073   °[facility] - Intensive Outpatient [address], [place], [place] 336-852-3033   °[facility] [address]  °[place], [place] 1-877-615-2722 or 336-784-9470   °[facility] [address] [place], [place] 336-227-7417 Accepts Medicaid  °[facility] [address]  °[place] 1-800-659-3381 Substance Abuse/Addiction Treatment  ° °[place] Behavioral Health Resources °Organization         Address  Phone  Notes  °[facility]  (888) 581-9988   °Julie Brannon, PhD [address] [place], [place]   (336) 349-5553 or (336) 951-0000   ° Behavioral   [address] °[place], [place] (336) 349-4454   °[facility] [address], [place], [place] (336) 342-8316 Insurance/Medicaid/sponsorship through [facility]  °[facility] [address]                                    [place], [place] (336) 342-8316 Therapy/tele-psych/case    °[facility] [address]  ° [place], [place] (336) 349-2233    °Dr. Arfeen  (336) 349-4544   °[facility] [place]  [employer] [place] [facility] 1) [address],  °2) [address], [place] °3)  [address], [place] (336) 349-3220 °(336) 342-7768 ° °(336) 342-8140   °[place] [facility] (336) 342-1394 or (336) 342-3537 (After Hours)    ° ° ° °Take the prescriptions as directed.  Use your albuterol inhaler (2 to 4 puffs) or your albuterol nebulizer (1 unit dose) every 4 hours for the next 7 days, then as needed for cough, wheezing, or shortness of breath.  Call your regular medical doctor tomorrow morning to schedule a follow up appointment within the next 2 days.  Return to the Emergency Department immediately sooner if worsening.  ° °

## 2014-11-08 NOTE — ED Notes (Signed)
Patient c/o Shortness of breath x1 month that is progressively getting worse. Per patient has been seen here once for it. Denies any chest pain. Per patient hx of emphysema, uses neb treatment and oxygen 2 liters via [place] with no relief. Audible wheezing noted.

## 2014-11-10 DIAGNOSIS — R0600 Dyspnea, unspecified: Secondary | ICD-10-CM | POA: Diagnosis not present

## 2014-11-10 DIAGNOSIS — J40 Bronchitis, not specified as acute or chronic: Secondary | ICD-10-CM | POA: Diagnosis not present

## 2014-11-10 DIAGNOSIS — J441 Chronic obstructive pulmonary disease with (acute) exacerbation: Secondary | ICD-10-CM | POA: Diagnosis not present

## 2014-11-10 DIAGNOSIS — J8410 Pulmonary fibrosis, unspecified: Secondary | ICD-10-CM | POA: Diagnosis not present

## 2014-11-11 DIAGNOSIS — J40 Bronchitis, not specified as acute or chronic: Secondary | ICD-10-CM | POA: Diagnosis not present

## 2014-11-11 DIAGNOSIS — R0600 Dyspnea, unspecified: Secondary | ICD-10-CM | POA: Diagnosis not present

## 2014-11-11 DIAGNOSIS — J8410 Pulmonary fibrosis, unspecified: Secondary | ICD-10-CM | POA: Diagnosis not present

## 2014-11-11 DIAGNOSIS — J441 Chronic obstructive pulmonary disease with (acute) exacerbation: Secondary | ICD-10-CM | POA: Diagnosis not present

## 2014-11-18 DIAGNOSIS — J449 Chronic obstructive pulmonary disease, unspecified: Secondary | ICD-10-CM | POA: Diagnosis not present

## 2014-11-25 ENCOUNTER — Encounter: Payer: Self-pay | Admitting: Physician Assistant

## 2014-11-25 ENCOUNTER — Ambulatory Visit (INDEPENDENT_AMBULATORY_CARE_PROVIDER_SITE_OTHER): Payer: 59 | Admitting: Physician Assistant

## 2014-11-25 ENCOUNTER — Telehealth: Payer: Self-pay | Admitting: Family Medicine

## 2014-11-25 VITALS — BP 136/78 | HR 80 | Temp 97.7°F | Resp 20 | Wt 156.0 lb

## 2014-11-25 DIAGNOSIS — I10 Essential (primary) hypertension: Secondary | ICD-10-CM | POA: Diagnosis not present

## 2014-11-25 DIAGNOSIS — J439 Emphysema, unspecified: Secondary | ICD-10-CM | POA: Diagnosis not present

## 2014-11-25 DIAGNOSIS — K635 Polyp of colon: Secondary | ICD-10-CM

## 2014-11-25 DIAGNOSIS — E785 Hyperlipidemia, unspecified: Secondary | ICD-10-CM

## 2014-11-25 DIAGNOSIS — E1165 Type 2 diabetes mellitus with hyperglycemia: Secondary | ICD-10-CM

## 2014-11-25 DIAGNOSIS — J40 Bronchitis, not specified as acute or chronic: Secondary | ICD-10-CM | POA: Diagnosis not present

## 2014-11-25 LAB — HEMOGLOBIN A1C, FINGERSTICK

## 2014-11-25 MED ORDER — INSULIN DETEMIR 100 UNIT/ML FLEXPEN: PEN_INJECTOR | SUBCUTANEOUS | Status: DC

## 2014-11-25 MED ORDER — PREDNISONE 20 MG PO TABS: 40.0000 mg | ORAL_TABLET | Freq: Every day | ORAL | Status: DC

## 2014-11-25 NOTE — Telephone Encounter (Signed)
Rec'd physician statement from [facility] and I called them back.  Pt has not taken any Insulin deliveries since December.  All his other regular medications were delivered on Feb 3,2016.  Pt tells them he can not afford.  [facility] says his co-pay for Lantus is $92.74/month.  Pt was switched to [place] last year due to cost and insurance formulary.  Seems this did not transfer over to [facility].  Per provider Levimer ordered thru [facility] at same RX as lantus.  Have already switched his resp meds to nebulizer treatments through his MCR part B.  Discussed that with patient this morning at office visit to be sure he was using them correctly.

## 2014-11-25 NOTE — Telephone Encounter (Signed)
Called [facility] to check on patient compliance with medications.  They are to send me Physician Information Statement.

## 2014-11-25 NOTE — Progress Notes (Signed)
Patient ID: Edward [name] MRN: [id number], DOB: 1935/08/10, 80 y.o. Date of Encounter: @DATE @  Chief Complaint:  Chief Complaint  Patient presents with  . 3 mth check up    is fasting    HPI: 80 y.o. year old male  presents for routine followup office visit.    At office visit with me 10/30/13 at which time we stopped multiple oral medicines and started Lantus. On 11/04/13 he had an ER visit with COPD. On 11/06/13 he had another ER visit with COPD. 11/10/2013 he had office visit with me to followup with COPD. 11/15/13 he was hospitalized with COPD. 12/01/13 have followup office visit with me regarding his COPD. 12/08/13 he had another followup office visit with me.  I had placed him on Spiriva and Symbicort for his COPD. Today he says that he is no longer taking these secondary to the expense. Says that he can tell a difference. Can tell that his breathing was improved when he was on these medications compared to when  he is off of these medicines. His last ROV was with Dr. Buelah Manis 05/12/14--at that visit she gave him more samples of Spiriva and Symbicort. However he says that he is back out of these again now. We did have [facility] visit him several months ago he was not compliant with following up with them and letting them help him.  He is taking his diabetes medications as directed. Currently on Levemir --20 units QHS. He did bring in BS log sheets with him today.  He only checks fasting blood sugars but has been checking them every morning.  At OV 11/25/14--All readings for the month of February are between 128-170. However he then says that this morning he got a reading of 210 and sometimes he gets high readings like this and doesn't know why. I then told him the readings he has written on his blood sugar log sheet and the fact that the highest reading is 170. He did not provide any explanation for why what he has written on the sheet does not match with what he is saying--??? He has been on  prednisone for recent COPD exacerbation--educated the ER for another COPD exacerbation recently.  At [place] 11/25/14-- He says that he has completed the oral prednisone that was prescribed at the ER. Says that he is having some wheezing and is using the 1 nebulizer twice daily every day as directed but is having to use the albuterol nebulizer every 2 or 3 hours.  He is taking his blood pressure medicines as directed. No adverse effects. In the past he had high potassium with ACE inhibitors and was changed to ARB.  03/12/14 labs showed elevated LFTs. At that time I said to put the pravastatin on hold and recheck LFTs in 2 weeks. He has had follow-up LFTs the last of which were on 05/18/2014 and they were normal. Today when I asked him about the pravastatin being on hold--- he doesn't remember anything about this--- and hands me a list of medicines which he knows he is currently taking and this does include pravastatin. He says he is taking it if it is on this list----Still taking pravastatin.   He continues to stay off of his cigarettes.      Past Medical History  Diagnosis Date  . Diabetes mellitus   . COPD (chronic obstructive pulmonary disease)   . Hypertension   . Allergy     Rhinitis  . Elevated lipids   .  Pulmonary fibrosis   . Bronchitis   . Colon polyps   . Vitamin D deficiency   . PSA elevation   . Chronic respiratory failure   . Hypercholesterolemia   . On home O2     2L N/C      Home Meds: Outpatient Prescriptions Prior to Visit  Medication Sig Dispense Refill  . albuterol (PROVENTIL) (2.5 MG/3ML) 0.083% nebulizer solution Take 3 mLs (2.5 mg total) by nebulization every 4 (four) hours as needed for wheezing or shortness of breath. 75 mL 0  . aspirin 81 MG EC tablet TAKE 1 TABLET BY MOUTH ONCE DAILY 30 tablet 5  . budesonide (PULMICORT) 0.5 MG/2ML nebulizer solution Take 2 mLs (0.5 mg total) by nebulization 2 (two) times daily. 120 mL 12  . doxycycline (VIBRA-TABS) 100 MG  tablet Take 1 tablet (100 mg total) by mouth 2 (two) times daily. 14 tablet 0  . EASY TOUCH PEN NEEDLES 31G X 8 MM MISC USE 1 PEN NEEDLE ONCE DAILY AS DIRECTED 100 each 5  . Insulin Detemir (LEVEMIR FLEXTOUCH) 100 UNIT/ML Pen Inject 20 units at bedtime (Patient taking differently: 20 Units by Subconjunctival route at bedtime. Inject 20 units at bedtime) 15 mL 11  . LANTUS SOLOSTAR 100 UNIT/ML Solostar Pen START INJECTING 15 UNITS UNDER THE SKIN EACH NIGHT, INCREASE BY ONE UNIT EACH NIGHT AS DIRECTED UP TO 40 UNITS EACH NIGHT 15 mL 5  . levofloxacin (LEVAQUIN) 500 MG tablet Take 1 tablet (500 mg total) by mouth daily. 10 tablet 0  . loratadine (ALLERGY RELIEF) 10 MG tablet Take 10 mg by mouth at bedtime as needed for allergies.    Marland Kitchen losartan (COZAAR) 100 MG tablet TAKE 1 TABLET BY MOUTH ONCE DAILY 30 tablet 5  . metFORMIN (GLUCOPHAGE) 1000 MG tablet TAKE 1 TABLET BY MOUTH 2 TIMES A DAY 60 tablet 5  . Omega-3 Fatty Acids (FISH OIL) 1000 MG CAPS Take 1 capsule by mouth 2 (two) times daily.     . ONE TOUCH ULTRA TEST test strip CHECK FASTING BLOOD SUGAR TWICE DAILY 50 each 5  . ONETOUCH DELICA LANCETS 25K MISC USE TO CHECK BLOOD SUGAR TWICE DAILY AS DIRECTED 100 each 5  . pravastatin (PRAVACHOL) 80 MG tablet TAKE 1 TABLET BY MOUTH ONCE DAILY AT BEDTIME 30 tablet 5  . predniSONE (DELTASONE) 20 MG tablet Take 2 tablets (40 mg total) by mouth daily. 10 tablet 0   No facility-administered medications prior to visit.     Allergies:  Allergies  Allergen Reactions  . Ace Inhibitors     Hyperkalemia--07/23/2013    History   Social History  . Marital Status: Married    Spouse Name: N/A  . Number of Children: N/A  . Years of Education: N/A   Occupational History  . 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 Smoking status: Former Smoker -- 1.50 packs/day for 60 years    Types: Cigarettes    Quit date: 12/31/2012  . Smokeless tobacco: Never Used  . Alcohol Use: No  .  Drug Use: No  . Sexual Activity: Yes    Birth Control/ Protection: None   Other Topics Concern  . Not on file   Social History Narrative    Family History  Problem Relation Age of Onset  . CAD Other   . Diabetes Other   . Heart disease Mother      Review of Systems:  See HPI for pertinent  ROS. All other ROS negative.    Physical Exam: Blood pressure 136/78, pulse 80, temperature 97.7 F (36.5 C), temperature source Oral, resp. rate 20, weight 156 lb (70.761 kg)., Body mass index is 25.19 kg/(m^2). General: WNWD WM, "Weathered Appearance" Appears in no acute distress. Neck: Supple. No thyromegaly. No lymphadenopathy. No carotid bruits. Lungs: He does have mild wheezes throughout bilaterally. However there is good air movement and it does not sound real tight. Heart: RRR with S1 S2. No murmurs, rubs, or gallops. Abdomen: Soft, non-tender, non-distended with normoactive bowel sounds. No hepatomegaly. No rebound/guarding. No obvious abdominal masses. Musculoskeletal:  Strength and tone normal for age. Extremities/Skin: Warm and dry. No edema.  Neuro: Alert and oriented X 3. Moves all extremities spontaneously. Gait is normal. CNII-XII grossly in tact. Psych:  Responds to questions appropriately with a normal affect. Diabetic foot exam: Inspection is normal. No wounds or concerning lesions. Sensation is intact. On the right: 2+ dorsalis pedis 2+ PT. On the left 1+ DP. 2+ PT.      ASSESSMENT AND PLAN:  80 y.o. year old male with   1. History of repeated noncompliance in the past secondary to finances.  In the past he frequently could not afford all of his oral diabetic medications. Today he reports he is unable to afford his Spiriva and Symbicort. We have been providing him with samples of both Spiriva and Symbicort. As well, we have been providing him with samples of Lantus or Levemir as often as possible. At his visit with me 03/12/14 I had [name] to contact [facility]  [employer] to see whether they can help provide some assistance to him. [facility] follow-up with him but he did not want their assistance  2. history of smoking. He quit smoking January 2014. Congratulations and staying off of the cigarettes!!  3. COPD Status post ER visit for COPD on 11/04/13 Another ER visit for COPD on 11/06/13 Status post hospitalization for COPD exacerbation 11/15/13 See HPI regarding Spiriva and Symbicort.  today--2/24/6--- repeat ordered the prednisone that had been ordered from the ER visit recently. Prednisone 20 mg 2 by mouth daily 5 days #10+0 Continue the nebulizers as directed.  3. Diabetes He is currently on Levemir 20 units QHS He did bring in her blood sugar along with fasting blood sugars every morning and they look good. See history of present illness.  - Hemoglobin A1c  Microalbumin 05/12/14 I have discussed need for routine eye exams and dental exams but he hasn't been able to followup with the secondary to finances. On ARB. (H/O hyperkalemia with ACE Inh) On statin.  2. Hypertension At Lab 07/23/13 his potassium was even higher. It had been borderline high prior to this. At that time his ACE inhibitor was stopped and he was started on ARB. - BASIC METABOLIC PANEL WITH GFR  3. Elevated lipids FLP excellent 08/17/2014 with triglycerides 186 HDL 45 LDL 65. LFTs were normal.  5. Pulmonary fibrosis  6. Elevated PSA He had elevated PSA at Lab 07/2012. Also prior to this PSA was elevated. He has been informed this could be secondary to prostate cancer but refused followup with urology. He still refuses to recheck PSA. He is aware that he could have prostate cancer but does not want further evaluation or followup with his AT ALL.  7. Colon polyps History of Hemoccult-positive stool x3 in 07/2008. Refused GI evaluation despite being informed of risk of severe GI bleed, cancer, et Ronney Asters. H./H. normal 03/2010 and 01/2011. Refuses colonoscopy.  8.  Immunizations: We have discussed influenza vaccine earlier this season and he is deferred. Pneumonia vaccine: He received Pneumovax June 2003.  Received  Prevnar 13  10/23/2013 .  Tetanus: Gave in September 2005. Not covered by Medicare --therefore has deferred f/u sec to cost.   At his OV with me 03/12/14---he was going to schedule followup office visit in one month so that we can followup to make sure he is followed up with partnerships for community or some type of assistance. Have given him enough Symbicort samples to last until then. I gave him 2 boxes of Spiriva, which is all that we have. [facility] did evaluate the patient and he was not compliant with avoiding them assist him.  Routine office visit 3 months or sooner if needed.   Signed, [address] [place], [place], [facility] 11/25/2014 8:22 AM

## 2014-11-26 NOTE — Telephone Encounter (Signed)
Called patient with lab results.  HgbA1C > 14.  Pt insists that sugar reading he brought to office were correct.  Despite pharmacy telling me that he has refused his insulin due to cost, he says he has insulin from them and HAS been using.  20 units a day!!!  Reinforced to patient that his Diabetes is way out of control based on his lab results.  Told him he need to check blood sugar every morning BEFORE eating and then 2 hours after eating a meal.  This will be twice a day, every day!!  To keep log of readings, was given new log sheet in office yesterday.  He says his blood sugars are high because of his nebulizer treatments!!  We had referred him to [facility] in past but he had refused them to come.  I told him we were going to refer him again and he needs to let them come and help him with patient education.  He was agreeable.

## 2014-11-26 NOTE — Telephone Encounter (Signed)
-----   Message from Orlena Sheldon, PA-C sent at 11/25/2014 10:41 AM EST ----- At office visit patient brought in log sheet with fasting blood sugars checked every morning and every single reading was very good. Follow-up with him to find out if these were true blood sugar readings or not. If the fasting blood sugars really are that good, then  he needs to start checking 2 hour postprandials and write  those down-- and bring those into a follow-up visit. Find out if he needs some diabetic education--- find out his education level/ literacy skills----so we can determine the underlying issue here and address it properly.

## 2014-12-08 DIAGNOSIS — R0600 Dyspnea, unspecified: Secondary | ICD-10-CM | POA: Diagnosis not present

## 2014-12-08 DIAGNOSIS — J8410 Pulmonary fibrosis, unspecified: Secondary | ICD-10-CM | POA: Diagnosis not present

## 2014-12-08 DIAGNOSIS — J40 Bronchitis, not specified as acute or chronic: Secondary | ICD-10-CM | POA: Diagnosis not present

## 2014-12-08 DIAGNOSIS — J441 Chronic obstructive pulmonary disease with (acute) exacerbation: Secondary | ICD-10-CM | POA: Diagnosis not present

## 2014-12-10 DIAGNOSIS — J441 Chronic obstructive pulmonary disease with (acute) exacerbation: Secondary | ICD-10-CM | POA: Diagnosis not present

## 2014-12-10 DIAGNOSIS — J8410 Pulmonary fibrosis, unspecified: Secondary | ICD-10-CM | POA: Diagnosis not present

## 2014-12-10 DIAGNOSIS — R0600 Dyspnea, unspecified: Secondary | ICD-10-CM | POA: Diagnosis not present

## 2014-12-10 DIAGNOSIS — J40 Bronchitis, not specified as acute or chronic: Secondary | ICD-10-CM | POA: Diagnosis not present

## 2014-12-17 DIAGNOSIS — J449 Chronic obstructive pulmonary disease, unspecified: Secondary | ICD-10-CM | POA: Diagnosis not present

## 2014-12-29 ENCOUNTER — Ambulatory Visit (INDEPENDENT_AMBULATORY_CARE_PROVIDER_SITE_OTHER): Payer: Medicare Other | Admitting: Family Medicine

## 2014-12-29 ENCOUNTER — Ambulatory Visit (HOSPITAL_COMMUNITY)
Admission: RE | Admit: 2014-12-29 | Discharge: 2014-12-29 | Disposition: A | Discharge status: Home / Self Care | Payer: Medicare Other | Source: Ambulatory Visit | Attending: Family Medicine | Admitting: Family Medicine

## 2014-12-29 ENCOUNTER — Encounter: Payer: Self-pay | Admitting: Family Medicine

## 2014-12-29 VITALS — BP 160/80 | HR 80 | Temp 98.2°F | Resp 22 | Ht 65.0 in | Wt 162.0 lb

## 2014-12-29 DIAGNOSIS — J439 Emphysema, unspecified: Secondary | ICD-10-CM

## 2014-12-29 DIAGNOSIS — R091 Pleurisy: Secondary | ICD-10-CM | POA: Diagnosis not present

## 2014-12-29 DIAGNOSIS — R05 Cough: Secondary | ICD-10-CM | POA: Diagnosis not present

## 2014-12-29 MED ORDER — TIOTROPIUM BROMIDE-OLODATEROL 2.5-2.5 MCG/ACT IN AERS: 2.0000 | INHALATION_SPRAY | Freq: Every day | RESPIRATORY_TRACT | Status: DC

## 2014-12-29 NOTE — Progress Notes (Signed)
Subjective:    Patient ID: Edward [name], male    DOB: 1935-01-02, 80 y.o.   MRN: [id number]  HPI Patient has chronic respiratory failure due to COPD/emphysema and pulmonary fibrosis. He is on oxygen 2 L via nasal cannula at home. At present he is using albuterol 4 times a day and Pulmicort twice a day. I have reviewed his previous office visit. He has discontinued spiriva and Symbicort due to cost in the past although the patient states he does not remember ever taking his medication. He reports worsening dyspnea. One week ago he coughed very hard and ever since he has had pleurisy in the right anterior chest at the junction of the ribs in the sternum. He is mildly tender to palpation in the left lower sternal border. He denies any hemoptysis. He denies any productive cough. He denies any fevers or chills. He denies any angina. He denies any weight loss. He denies any orthopnea or PND. He does report gradually worsening shortness of breath with exertion. Past Medical History  Diagnosis Date  . Diabetes mellitus   . COPD (chronic obstructive pulmonary disease)   . Hypertension   . Allergy     Rhinitis  . Elevated lipids   . Pulmonary fibrosis   . Bronchitis   . Colon polyps   . Vitamin D deficiency   . PSA elevation   . Chronic respiratory failure   . Hypercholesterolemia   . On home O2     2L N/C    Past Surgical History  Procedure Laterality Date  . Cataract extraction w/phaco  06/25/2012    Procedure: CATARACT EXTRACTION PHACO AND INTRAOCULAR LENS PLACEMENT (IOC);  Surgeon: Elta Guadeloupe T. Gershon Crane, MD;  Location: [facility];  Service: Ophthalmology;  Laterality: Left;  CDE=19.01  . Cataract extraction w/phaco  07/09/2012    Procedure: CATARACT EXTRACTION PHACO AND INTRAOCULAR LENS PLACEMENT (IOC);  Surgeon: Elta Guadeloupe T. Gershon Crane, MD;  Location: [facility];  Service: Ophthalmology;  Laterality: Right;  CDE: 20.09   Current Outpatient Prescriptions on File Prior to Visit  Medication Sig Dispense Refill    . albuterol (PROVENTIL) (2.5 MG/3ML) 0.083% nebulizer solution Take 3 mLs (2.5 mg total) by nebulization every 4 (four) hours as needed for wheezing or shortness of breath. 75 mL 0  . aspirin 81 MG EC tablet TAKE 1 TABLET BY MOUTH ONCE DAILY 30 tablet 5  . budesonide (PULMICORT) 0.5 MG/2ML nebulizer solution Take 2 mLs (0.5 mg total) by nebulization 2 (two) times daily. 120 mL 12  . EASY TOUCH PEN NEEDLES 31G X 8 MM MISC USE 1 PEN NEEDLE ONCE DAILY AS DIRECTED 100 each 5  . Insulin Detemir (LEVEMIR FLEXTOUCH) 100 UNIT/ML Pen Inject 20 units at bedtime 15 mL 11  . loratadine (ALLERGY RELIEF) 10 MG tablet Take 10 mg by mouth at bedtime as needed for allergies.    Marland Kitchen losartan (COZAAR) 100 MG tablet TAKE 1 TABLET BY MOUTH ONCE DAILY 30 tablet 5  . metFORMIN (GLUCOPHAGE) 1000 MG tablet TAKE 1 TABLET BY MOUTH 2 TIMES A DAY 60 tablet 5  . Omega-3 Fatty Acids (FISH OIL) 1000 MG CAPS Take 1 capsule by mouth 2 (two) times daily.     . ONE TOUCH ULTRA TEST test strip CHECK FASTING BLOOD SUGAR TWICE DAILY 50 each 5  . ONETOUCH DELICA LANCETS 78E MISC USE TO CHECK BLOOD SUGAR TWICE DAILY AS DIRECTED 100 each 5  . pravastatin (PRAVACHOL) 80 MG tablet TAKE 1 TABLET BY MOUTH ONCE DAILY  AT BEDTIME 30 tablet 5   No current facility-administered medications on file prior to visit.   Allergies  Allergen Reactions  . Ace Inhibitors     Hyperkalemia--07/23/2013   History   Social History  . Marital Status: Married    Spouse Name: N/A  . Number of Children: N/A  . Years of Education: N/A   Occupational History  . 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 Smoking status: Former Smoker -- 1.50 packs/day for 60 years    Types: Cigarettes    Quit date: 12/31/2012  . Smokeless tobacco: Never Used  . Alcohol Use: No  . Drug Use: No  . Sexual Activity: Yes    Birth Control/ Protection: None   Other Topics Concern  . Not on file   Social History Narrative      Review of Systems   All other systems reviewed and are negative.      Objective:   Physical Exam  Constitutional: He appears well-developed and well-nourished. No distress.  Neck: Neck supple. No JVD present.  Cardiovascular: Normal rate, regular rhythm and normal heart sounds.   No murmur heard. Pulmonary/Chest: Effort normal. He has decreased breath sounds in the right upper field, the right lower field, the left upper field and the left lower field. He has wheezes. He has no rhonchi. He has no rales. He exhibits bony tenderness.    Abdominal: Soft. Bowel sounds are normal. He exhibits no distension. There is no tenderness. There is no rebound and no guarding.  Musculoskeletal: He exhibits no edema.  Lymphadenopathy:    He has no cervical adenopathy.  Skin: He is not diaphoretic.  Vitals reviewed.         Assessment & Plan:  Pleurisy - Plan: DG Chest 2 View  Pulmonary emphysema, unspecified emphysema type - Plan: Tiotropium Bromide-Olodaterol (STIOLTO RESPIMAT) 2.5-2.5 MCG/ACT AERS  Patient has severe COPD. I explained to the patient the need for compliance with daily long-acting bronchodilators and anti-cholinergics.  Begin Stiolto 2 inh qday and recheck in 2 weeks. I believe this will help his dyspnea on exertion. I also would like to obtain a chest x-ray but I believe his pleurisy is due to either a cracked rib or a torn intercostal muscle in the area diagrammed above due to coughing.

## 2015-01-08 DIAGNOSIS — J8410 Pulmonary fibrosis, unspecified: Secondary | ICD-10-CM | POA: Diagnosis not present

## 2015-01-08 DIAGNOSIS — J40 Bronchitis, not specified as acute or chronic: Secondary | ICD-10-CM | POA: Diagnosis not present

## 2015-01-08 DIAGNOSIS — J441 Chronic obstructive pulmonary disease with (acute) exacerbation: Secondary | ICD-10-CM | POA: Diagnosis not present

## 2015-01-08 DIAGNOSIS — R0600 Dyspnea, unspecified: Secondary | ICD-10-CM | POA: Diagnosis not present

## 2015-01-10 DIAGNOSIS — J441 Chronic obstructive pulmonary disease with (acute) exacerbation: Secondary | ICD-10-CM | POA: Diagnosis not present

## 2015-01-10 DIAGNOSIS — J8410 Pulmonary fibrosis, unspecified: Secondary | ICD-10-CM | POA: Diagnosis not present

## 2015-01-10 DIAGNOSIS — R0600 Dyspnea, unspecified: Secondary | ICD-10-CM | POA: Diagnosis not present

## 2015-01-10 DIAGNOSIS — J40 Bronchitis, not specified as acute or chronic: Secondary | ICD-10-CM | POA: Diagnosis not present

## 2015-01-14 ENCOUNTER — Other Ambulatory Visit: Payer: Self-pay | Admitting: Physician Assistant

## 2015-01-15 NOTE — Telephone Encounter (Signed)
Medication refilled per protocol. 

## 2015-01-17 DIAGNOSIS — J449 Chronic obstructive pulmonary disease, unspecified: Secondary | ICD-10-CM | POA: Diagnosis not present

## 2015-01-25 ENCOUNTER — Other Ambulatory Visit: Payer: Self-pay | Admitting: *Deleted

## 2015-01-25 DIAGNOSIS — E1165 Type 2 diabetes mellitus with hyperglycemia: Secondary | ICD-10-CM

## 2015-01-25 DIAGNOSIS — J441 Chronic obstructive pulmonary disease with (acute) exacerbation: Secondary | ICD-10-CM

## 2015-01-25 DIAGNOSIS — IMO0002 Reserved for concepts with insufficient information to code with codable children: Secondary | ICD-10-CM

## 2015-01-25 DIAGNOSIS — J8410 Pulmonary fibrosis, unspecified: Secondary | ICD-10-CM

## 2015-01-25 NOTE — Patient Outreach (Signed)
[facility]) Care Management  01/25/2015  Shaheem KHAIDYN STAEBELL [birth year] [id number]   MD referral states patient is agreeable to [facility] care management services. Major diagnoses are diabetes mellitus-on insulin, COPD-on oxygen, HTN, pulmonary fibrosis.  Per chart review patient had 4 emergency room visits in 4 months for respiratory problems. Referral also states patient's hemoglobin A1c level is greater than 14.      Telephone call to patient who was advised of reason for call and of [facility] care management services.  Patient knows his diabetes is out of control. States he is checking blood sugar and it was 200 this am, States he does not know his target blood sugar. States has had lots of breathing trouble and coughing. States he thinks breathing machine is causing  blood sugar to go up. Patient states he is taking medications as prescribed by doctor and is not having trouble getting it; uses local pharmacy and sees primary care every 3 months.   Patient agrees to [facility] care management services but states he does not know how we can help him.  Plan: will make referral to [place] care coordinator disease management , medication management /dxs DM-out of control, COPD-pulmonary fibrosis.  Sherrin Daisy, RN BSN [place] Management Coordinator [facility] Care Management  [phone]

## 2015-01-28 ENCOUNTER — Other Ambulatory Visit: Payer: Self-pay | Admitting: *Deleted

## 2015-01-28 NOTE — Patient Outreach (Signed)
Call placed to patient to discuss referral to [facility] community case management program. Spoke with patient and reviewed [facility] RNCM role and referral from patient doctor. Patient not sure if he wants to participate at this time. He agreed to think about the program. RNCM set up time to call patient back next week to further discuss patient decision to participate.  Plan call Feb 03, 2015   Larue Laymond Purser, RN, BSN, [place] [phone]

## 2015-02-03 ENCOUNTER — Other Ambulatory Visit: Payer: Self-pay | Admitting: *Deleted

## 2015-02-03 NOTE — Patient Outreach (Signed)
Call to patient to discuss referral to [facility]. Patient agrees to services, initial home visit scheduled for Feb 09, 2015.   Royetta Crochet. Laymond Purser, RN, BSN, [place] [phone]

## 2015-02-09 ENCOUNTER — Other Ambulatory Visit: Payer: Self-pay | Admitting: *Deleted

## 2015-02-09 DIAGNOSIS — J441 Chronic obstructive pulmonary disease with (acute) exacerbation: Secondary | ICD-10-CM | POA: Diagnosis not present

## 2015-02-09 DIAGNOSIS — R0600 Dyspnea, unspecified: Secondary | ICD-10-CM | POA: Diagnosis not present

## 2015-02-09 DIAGNOSIS — J8410 Pulmonary fibrosis, unspecified: Secondary | ICD-10-CM | POA: Diagnosis not present

## 2015-02-09 DIAGNOSIS — J40 Bronchitis, not specified as acute or chronic: Secondary | ICD-10-CM | POA: Diagnosis not present

## 2015-02-09 NOTE — Patient Outreach (Signed)
[facility]) Care Management   02/09/2015  Edward [name] 1935-09-16 [id number]  [place] is an 80 y.o. male  Subjective:  Patient lives with spouse and son.  He reports he is independent Patient reporting he does not know what CM can do for him. Patient reports he does not follow a diet, has not had any formal diabetic classes. Patient verbalizes he thought a blood sugar of 200 was good Patient requests that RNCM visit via telephone next month.  Patient gets medications from mail order pharmacy Patient reports his action plan for shortness of breath is to go to ED, states he saw a lung doctor but never went back, his primary care doctor cares for his COPD. He did quit smoking a couple of years ago, wears oxygen at night and as needed during day.    Objective:   Review of Systems  Constitutional: Negative.   Eyes: Negative.   Respiratory: Positive for wheezing.   Gastrointestinal: Negative.   Genitourinary: Negative.   Skin: Negative.   Neurological: Negative.   Endo/Heme/Allergies: Negative.   Psychiatric/Behavioral: Negative.     Physical Exam  Constitutional: He is oriented to person, place, and time.  Neck: Normal range of motion.  Respiratory: Effort normal. He has wheezes.  Uses oxygen at night and prn (2lpm via nasal cannula)  Musculoskeletal: Normal range of motion.  Neurological: He is alert and oriented to person, place, and time.  Skin: Skin is warm.  Psychiatric: He has a normal mood and affect. His behavior is normal.    Current Medications:   Current Outpatient Prescriptions  Medication Sig Dispense Refill  . albuterol (PROVENTIL) (2.5 MG/3ML) 0.083% nebulizer solution INHALE 1 VIAL VIA NEBULIZER EVERY 6 HOURS AS NEEDED FOR WHEEZING OR SHORTNESS OF BREATH 75 vial 5  . aspirin 81 MG EC tablet TAKE 1 TABLET BY MOUTH ONCE DAILY 30 tablet 5  . budesonide (PULMICORT) 0.5 MG/2ML nebulizer solution Take 2 mLs (0.5 mg total) by nebulization  2 (two) times daily. 120 mL 12  . EASY TOUCH PEN NEEDLES 31G X 8 MM MISC USE 1 PEN NEEDLE ONCE DAILY AS DIRECTED 100 each 5  . Insulin Detemir (LEVEMIR FLEXTOUCH) 100 UNIT/ML Pen Inject 20 units at bedtime 15 mL 11  . losartan (COZAAR) 100 MG tablet TAKE 1 TABLET BY MOUTH ONCE DAILY 30 tablet 5  . metFORMIN (GLUCOPHAGE) 1000 MG tablet TAKE 1 TABLET BY MOUTH 2 TIMES A DAY 60 tablet 5  . Omega-3 Fatty Acids (FISH OIL) 1000 MG CAPS Take 1 capsule by mouth 2 (two) times daily.     . ONE TOUCH ULTRA TEST test strip CHECK FASTING BLOOD SUGAR TWICE DAILY 50 each 5  . ONETOUCH DELICA LANCETS 03E MISC USE TO CHECK BLOOD SUGAR TWICE DAILY AS DIRECTED 100 each 5  . pravastatin (PRAVACHOL) 80 MG tablet TAKE 1 TABLET BY MOUTH ONCE DAILY AT BEDTIME 30 tablet 5  . loratadine (ALLERGY RELIEF) 10 MG tablet Take 10 mg by mouth at bedtime as needed for allergies.    . Tiotropium Bromide-Olodaterol (STIOLTO RESPIMAT) 2.5-2.5 MCG/ACT AERS Inhale 2 Inhalers into the lungs daily. (Patient not taking: Reported on 02/09/2015) 1 Inhaler 5   No current facility-administered medications for this visit.    Functional Status:   In your present state of health, do you have any difficulty performing the following activities: 02/09/2015  Hearing? N  Vision? N  Difficulty concentrating or making decisions? N  Walking or climbing stairs? N  Dressing or bathing? N  Doing errands, shopping? N  Preparing Food and eating ? N  Using the Toilet? N  In the past six months, have you accidently leaked urine? N  Do you have problems with loss of bowel control? N  Managing your Medications? N  Managing your Finances? N  Housekeeping or managing your Housekeeping? Y    Fall/Depression Screening:    PHQ 2/9 Scores 02/09/2015 12/01/2013  PHQ - 2 Score 1 0    Assessment:   Needs education on diabetic diet and normal, high and low blood sugars Needs action plan for COPD, reviewed medications and inhalers for COPD Encouraged to keep  MD appointment [place] Problem One        Patient Outreach from 02/09/2015 in [place] Problem One  Knowledge deficit related to diabetes and diet    Care Plan for Problem One  Active   [facility] Long Term Goal (31-90 days)  Patient will not have hospitalization due to diabetic complications for the next 31 days   [facility]N Long Term Goal Start Date  02/09/15   Interventions for Problem One Long Term Goal  utilizing teachback method, gave and reviewed [facility] diabetes packet   [facility] CM Short Term Goal #1 (0-30 days)  Patient will write down his blood sugars in blue [facility] calendar and take to MD appointments with in the next 21 days   [facility]N CM Short Term Goal #1 Start Date  02/09/15   Interventions for Short Term Goal #1  Gave [facility]N blue calendar and reviewed diabetes education and where to record on cbg log    [facility] CM Care Plan Problem Two        Patient Outreach from 02/09/2015 in [place] Problem Two  Needs action plan for COPD exacerbation   Care Plan for Problem Two  Active   Interventions for Problem Two Long Term Goal   using teachback method with patient, reviewed COPD zones in [facility] calendar, reviewed medication for COPD and use of rescue inhaler   THN Long Term Goal (31-90) days  Patient will not have hospitalization or ED visit for COPD exacerbation in 31 days   [facility] Long Term Goal Start Date  02/09/15     Plan:   Call to patient in June Fax note and letter to MD  Royetta Crochet. Laymond Purser, RN, BSN, [place] [phone]

## 2015-02-11 NOTE — Patient Outreach (Signed)
[facility]) Care Management  02/11/2015  Edward [name] Jan 06, 1935 [id number]   Received notification from Burgess Amor, RN patient agreed to [place] Management services.  Patient marked as active.  [name]. [place] CM Assistant Phone: [phone] Fax: [phone]

## 2015-02-12 ENCOUNTER — Encounter: Payer: Self-pay | Admitting: *Deleted

## 2015-02-16 DIAGNOSIS — J449 Chronic obstructive pulmonary disease, unspecified: Secondary | ICD-10-CM | POA: Diagnosis not present

## 2015-02-19 DIAGNOSIS — J8410 Pulmonary fibrosis, unspecified: Secondary | ICD-10-CM | POA: Diagnosis not present

## 2015-02-19 DIAGNOSIS — R0600 Dyspnea, unspecified: Secondary | ICD-10-CM | POA: Diagnosis not present

## 2015-02-19 DIAGNOSIS — J441 Chronic obstructive pulmonary disease with (acute) exacerbation: Secondary | ICD-10-CM | POA: Diagnosis not present

## 2015-02-19 DIAGNOSIS — J40 Bronchitis, not specified as acute or chronic: Secondary | ICD-10-CM | POA: Diagnosis not present

## 2015-02-25 ENCOUNTER — Encounter: Payer: Self-pay | Admitting: Physician Assistant

## 2015-02-25 ENCOUNTER — Ambulatory Visit (INDEPENDENT_AMBULATORY_CARE_PROVIDER_SITE_OTHER): Payer: Medicare Other | Admitting: Physician Assistant

## 2015-02-25 VITALS — BP 122/60 | HR 79 | Temp 98.4°F | Resp 19 | Wt 159.0 lb

## 2015-02-25 DIAGNOSIS — I519 Heart disease, unspecified: Secondary | ICD-10-CM | POA: Diagnosis not present

## 2015-02-25 DIAGNOSIS — J439 Emphysema, unspecified: Secondary | ICD-10-CM | POA: Diagnosis not present

## 2015-02-25 DIAGNOSIS — K635 Polyp of colon: Secondary | ICD-10-CM

## 2015-02-25 DIAGNOSIS — R972 Elevated prostate specific antigen [PSA]: Secondary | ICD-10-CM

## 2015-02-25 DIAGNOSIS — J441 Chronic obstructive pulmonary disease with (acute) exacerbation: Secondary | ICD-10-CM | POA: Diagnosis not present

## 2015-02-25 DIAGNOSIS — J8410 Pulmonary fibrosis, unspecified: Secondary | ICD-10-CM

## 2015-02-25 DIAGNOSIS — E1165 Type 2 diabetes mellitus with hyperglycemia: Secondary | ICD-10-CM

## 2015-02-25 DIAGNOSIS — E785 Hyperlipidemia, unspecified: Secondary | ICD-10-CM | POA: Diagnosis not present

## 2015-02-25 DIAGNOSIS — I5189 Other ill-defined heart diseases: Secondary | ICD-10-CM

## 2015-02-25 DIAGNOSIS — I10 Essential (primary) hypertension: Secondary | ICD-10-CM | POA: Diagnosis not present

## 2015-02-25 LAB — COMPLETE METABOLIC PANEL WITH GFR
ALT: 12 U/L (ref 0–53)
AST: 13 U/L (ref 0–37)
Albumin: 4.1 g/dL (ref 3.5–5.2)
Alkaline Phosphatase: 78 U/L (ref 39–117)
BUN: 12 mg/dL (ref 6–23)
CALCIUM: 9.5 mg/dL (ref 8.4–10.5)
CO2: 27 mEq/L (ref 19–32)
CREATININE: 0.96 mg/dL (ref 0.50–1.35)
Chloride: 96 mEq/L (ref 96–112)
GFR, Est African American: 88 mL/min
GFR, Est Non African American: 76 mL/min
GLUCOSE: 221 mg/dL — AB (ref 70–99)
Potassium: 4.8 mEq/L (ref 3.5–5.3)
Sodium: 134 mEq/L — ABNORMAL LOW (ref 135–145)
Total Bilirubin: 0.3 mg/dL (ref 0.2–1.2)
Total Protein: 6.5 g/dL (ref 6.0–8.3)

## 2015-02-25 LAB — HEMOGLOBIN A1C
HEMOGLOBIN A1C: 13.6 % — AB (ref <5.7)
Mean Plasma Glucose: 344 mg/dL — ABNORMAL HIGH (ref <117)

## 2015-02-25 LAB — LIPID PANEL
CHOLESTEROL: 141 mg/dL (ref 0–200)
HDL: 42 mg/dL (ref >=40)
LDL Cholesterol: 68 mg/dL (ref 0–99)
Total CHOL/HDL Ratio: 3.4 Ratio
Triglycerides: 153 mg/dL — ABNORMAL HIGH (ref <150)
VLDL: 31 mg/dL (ref 0–40)

## 2015-02-25 MED ORDER — AZITHROMYCIN 250 MG PO TABS: ORAL_TABLET | ORAL | Status: DC

## 2015-02-25 MED ORDER — PREDNISONE 20 MG PO TABS
ORAL_TABLET | ORAL | Status: DC
Start: 2015-02-25 — End: 2015-05-10

## 2015-02-25 NOTE — Progress Notes (Signed)
Patient ID: Edward [name] MRN: [id number], DOB: Jan 10, 1935, 80 y.o. Date of Encounter: @DATE @  Chief Complaint:  Chief Complaint  Patient presents with  . follow up    HPI: 80 y.o. year old male  presents for routine followup office visit.    At office visit with me 10/30/13 at which time we stopped multiple oral medicines and started Lantus. On 11/04/13 he had an ER visit with COPD. On 11/06/13 he had another ER visit with COPD. 11/10/2013 he had office visit with me to followup with COPD. 11/15/13 he was hospitalized with COPD. 12/01/13 have followup office visit with me regarding his COPD. 12/08/13 he had another followup office visit with me.  In the past I had placed him on Spiriva and Symbicort for his COPD. Repeatedly, he was noncompliant with these secondary to finances. At office visits I gave him samples repeatedly.  He is currently on budesonide Pulmicort nebulizer. Also currently on tiotropium bromide--Stiolto Respimat. Also nasal cannula oxygen. Also albuterol nebulizer.  Also in the past we had [facility] evaluate and visit him but then he was not compliant with following up with them and letting them help him.  We recently did contact them again and I do see that they have entered recent notes starting on 01/25/15 up through 02/09/15.  At visit 02/25/15 he states that he is "all choked up" says that he has phlegm in his chest and throat. In using his nebulizer a lot for 5 times a day and at night. Says that he has oxygen at home but he just uses that one or 2 times per day as needed and sometimes at night.  At visit 02/25/15 he did bring in blood sugar log sheet. He does have documented a fasting reading for every single morning. No other readings at any other times of day. For the month of May all of his readings are ranging 121-200. He only has 3 readings above 200. May 12 was 230. May 14 was 206. May 26-220 For the month of April fasting readings all range from 109 to 215.  However most of these readings are around 130. For the month of March, fasting readings range mostly from 143 to around 170.  He continues to stay off of his cigarettes. Says he hasn't smoked in over 2 years.  Reports that he is taking blood pressure medication as directed with no headedness. Reports that he is taking his pravastatin with no myalgias or other adverse effects.      Past Medical History  Diagnosis Date  . Diabetes mellitus   . COPD (chronic obstructive pulmonary disease)   . Hypertension   . Allergy     Rhinitis  . Elevated lipids   . Pulmonary fibrosis   . Bronchitis   . Colon polyps   . Vitamin D deficiency   . PSA elevation   . Chronic respiratory failure   . Hypercholesterolemia   . On home O2     2L N/C      Home Meds: Outpatient Prescriptions Prior to Visit  Medication Sig Dispense Refill  . albuterol (PROVENTIL) (2.5 MG/3ML) 0.083% nebulizer solution INHALE 1 VIAL VIA NEBULIZER EVERY 6 HOURS AS NEEDED FOR WHEEZING OR SHORTNESS OF BREATH 75 vial 5  . aspirin 81 MG EC tablet TAKE 1 TABLET BY MOUTH ONCE DAILY 30 tablet 5  . budesonide (PULMICORT) 0.5 MG/2ML nebulizer solution Take 2 mLs (0.5 mg total) by nebulization 2 (two) times daily. 120 mL 12  .  EASY TOUCH PEN NEEDLES 31G X 8 MM MISC USE 1 PEN NEEDLE ONCE DAILY AS DIRECTED 100 each 5  . Insulin Detemir (LEVEMIR FLEXTOUCH) 100 UNIT/ML Pen Inject 20 units at bedtime 15 mL 11  . loratadine (ALLERGY RELIEF) 10 MG tablet Take 10 mg by mouth at bedtime as needed for allergies.    Marland Kitchen losartan (COZAAR) 100 MG tablet TAKE 1 TABLET BY MOUTH ONCE DAILY 30 tablet 5  . metFORMIN (GLUCOPHAGE) 1000 MG tablet TAKE 1 TABLET BY MOUTH 2 TIMES A DAY 60 tablet 5  . Omega-3 Fatty Acids (FISH OIL) 1000 MG CAPS Take 1 capsule by mouth 2 (two) times daily.     . ONE TOUCH ULTRA TEST test strip CHECK FASTING BLOOD SUGAR TWICE DAILY 50 each 5  . ONETOUCH DELICA LANCETS 16X MISC USE TO CHECK BLOOD SUGAR TWICE DAILY AS DIRECTED  100 each 5  . pravastatin (PRAVACHOL) 80 MG tablet TAKE 1 TABLET BY MOUTH ONCE DAILY AT BEDTIME 30 tablet 5  . Tiotropium Bromide-Olodaterol (STIOLTO RESPIMAT) 2.5-2.5 MCG/ACT AERS Inhale 2 Inhalers into the lungs daily. (Patient not taking: Reported on 02/09/2015) 1 Inhaler 5   No facility-administered medications prior to visit.     Allergies:  Allergies  Allergen Reactions  . Ace Inhibitors     Hyperkalemia--07/23/2013    History   Social History  . Marital Status: Married    Spouse Name: N/A  . Number of Children: N/A  . Years of Education: N/A   Occupational History  . 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 Smoking status: Former Smoker -- 1.50 packs/day for 60 years    Types: Cigarettes    Quit date: 12/31/2012  . Smokeless tobacco: Never Used  . Alcohol Use: No  . Drug Use: No  . Sexual Activity: Yes    Birth Control/ Protection: None   Other Topics Concern  . Not on file   Social History Narrative    Family History  Problem Relation Age of Onset  . CAD Other   . Diabetes Other   . Heart disease Mother      Review of Systems:  See HPI for pertinent ROS. All other ROS negative.    Physical Exam: Blood pressure 122/60, pulse 79, temperature 98.4 F (36.9 C), temperature source Oral, resp. rate 19, weight 159 lb (72.122 kg)., Body mass index is 25.68 kg/(m^2).  SaO2 84 % on RA. General: WNWD WM, "Weathered Appearance" Appears in no acute distress. Neck: Supple. No thyromegaly. No lymphadenopathy. No carotid bruits. Lungs: He does have mild wheezes throughout bilaterally. Decreased breaths sounds and air movement throughout, bilaterally.  Heart: RRR with S1 S2. No murmurs, rubs, or gallops. Abdomen: Soft, non-tender, non-distended with normoactive bowel sounds. No hepatomegaly. No rebound/guarding. No obvious abdominal masses. Musculoskeletal:  Strength and tone normal for age. Extremities/Skin: Warm and dry. No edema.  Neuro:  Alert and oriented X 3. Moves all extremities spontaneously. Gait is normal. CNII-XII grossly in tact. Psych:  Responds to questions appropriately with a normal affect. Diabetic foot exam: Inspection is normal. No wounds or concerning lesions. Sensation is intact. On the right: 2+ dorsalis pedis 2+ PT. On the left 1+ DP. 2+ PT.      ASSESSMENT AND PLAN:  80 y.o. year old male with   1. History of repeated noncompliance in the past secondary to finances.  In the past he frequently could not afford all of his oral diabetic medications.  Today he reports he is unable to afford his Spiriva and Symbicort. We have been providing him with samples of both Spiriva and Symbicort. As well, we have been providing him with samples of Lantus or Levemir as often as possible. At his visit with me 03/12/14 I had [name] to contact [employer] to see whether they can help provide some assistance to him. [facility] follow-up with him but he did not want their assistance --01/2015--[facility]N has reached out to pt again.  Will see if this helps---will f/u Is now on Neb meds  2. history of smoking. He quit smoking January 2014. Congratulations and staying off of the cigarettes!!  3. COPD Status post ER visit for COPD on 11/04/13 Another ER visit for COPD on 11/06/13 Status post hospitalization for COPD exacerbation 11/15/13 See HPI regarding Spiriva and Symbicort.  He is on now on Pulmicort nebulizer (budesonide) He is now on Stiolto Respimat (tiotropium bromide) He has oxygen nasal cannula at home but is not using it regularly.  02/25/15 OV--- SaO2 on RA 84% I am treating his current exacerbation with Z-Pak, prednisone 20 mg take 2 daily for 5 days. He is to use his oxygen 24 hours a day. Follow-up immediately if symptoms worsen.  3. Diabetes  Last visit 11/25/14 A1c came back at >14----- however at that visit he had brought with him blood sugar log with readings that looked good. That made no sense.  I am  wondering whether he is simply not taking the medication at all and just writing down numbers that will make me happy. Follow-up today's A1c and go from there. The history of present illness regarding blood sugar log readings that he brought to today's visit.  - Hemoglobin A1c  Microalbumin 05/12/14 I have discussed need for routine eye exams and dental exams but he hasn't been able to followup with the secondary to finances. On ARB. (H/O hyperkalemia with ACE Inh) On statin. On ASA 81mg .   2. Hypertension At Lab 07/23/13 his potassium was even higher. It had been borderline high prior to this. At that time his ACE inhibitor was stopped and he was started on ARB. - BASIC METABOLIC PANEL WITH GFR  3. Elevated lipids FLP excellent 08/17/2014 with triglycerides 186 HDL 45 LDL 65. LFTs were normal.  5. Pulmonary fibrosis  6. Elevated PSA He had elevated PSA at Lab 07/2012. Also prior to this PSA was elevated. He has been informed this could be secondary to prostate cancer but refused followup with urology. He still refuses to recheck PSA. He is aware that he could have prostate cancer but does not want further evaluation or followup with his AT ALL.  7. Colon polyps History of Hemoccult-positive stool x3 in 07/2008. Refused GI evaluation despite being informed of risk of severe GI bleed, cancer, et Ronney Asters. H./H. normal 03/2010 and 01/2011. Refuses colonoscopy.  8. Immunizations: We have discussed influenza vaccine earlier this season and he is deferred. Pneumonia vaccine: He received Pneumovax June 2003.  Received  Prevnar 13  10/23/2013 .  Tetanus: Gave in September 2005. Not covered by Medicare --therefore has deferred f/u sec to cost.    Routine office visit 3 months or sooner if needed.   [address] [place], [place], [facility] 02/25/2015 1:54 PM

## 2015-03-11 ENCOUNTER — Other Ambulatory Visit: Payer: Self-pay | Admitting: Physician Assistant

## 2015-03-12 DIAGNOSIS — R0600 Dyspnea, unspecified: Secondary | ICD-10-CM | POA: Diagnosis not present

## 2015-03-12 DIAGNOSIS — J441 Chronic obstructive pulmonary disease with (acute) exacerbation: Secondary | ICD-10-CM | POA: Diagnosis not present

## 2015-03-12 DIAGNOSIS — J40 Bronchitis, not specified as acute or chronic: Secondary | ICD-10-CM | POA: Diagnosis not present

## 2015-03-12 DIAGNOSIS — J8410 Pulmonary fibrosis, unspecified: Secondary | ICD-10-CM | POA: Diagnosis not present

## 2015-03-12 NOTE — Telephone Encounter (Signed)
Script sent to pharmacy.

## 2015-03-17 DIAGNOSIS — J441 Chronic obstructive pulmonary disease with (acute) exacerbation: Secondary | ICD-10-CM | POA: Diagnosis not present

## 2015-03-17 DIAGNOSIS — R0600 Dyspnea, unspecified: Secondary | ICD-10-CM | POA: Diagnosis not present

## 2015-03-17 DIAGNOSIS — J40 Bronchitis, not specified as acute or chronic: Secondary | ICD-10-CM | POA: Diagnosis not present

## 2015-03-17 DIAGNOSIS — J8410 Pulmonary fibrosis, unspecified: Secondary | ICD-10-CM | POA: Diagnosis not present

## 2015-03-18 ENCOUNTER — Encounter: Payer: Self-pay | Admitting: Physician Assistant

## 2015-03-18 ENCOUNTER — Ambulatory Visit (INDEPENDENT_AMBULATORY_CARE_PROVIDER_SITE_OTHER): Payer: Medicare Other | Admitting: Physician Assistant

## 2015-03-18 VITALS — BP 138/70 | HR 78 | Temp 98.2°F | Resp 18 | Wt 164.0 lb

## 2015-03-18 DIAGNOSIS — L03012 Cellulitis of left finger: Secondary | ICD-10-CM | POA: Diagnosis not present

## 2015-03-18 MED ORDER — CEPHALEXIN 500 MG PO CAPS: 500.0000 mg | ORAL_CAPSULE | Freq: Four times a day (QID) | ORAL | Status: DC

## 2015-03-18 NOTE — Progress Notes (Signed)
Patient ID: Edward [name] MRN: [id number], DOB: [birth year], 80 y.o. Date of Encounter: 03/18/2015, 4:13 PM    Chief Complaint:  Chief Complaint  Patient presents with  . OTHER    left middle finger red swollen X 2 weeks     HPI: 80 y.o. year old male says that about 2 weeks ago, while cooking,  hot grease popped in the pan--and landed on his left third finger.  Says that he thinks it was about 3 or 4 days later that the left third finger started to look similar to what it looks like now. Asked if he had seen any purulent drainage coming from the site. He says all he has seen from the site is watery clear drainage. He states that he does not think that the finger looks much worse now than it did 1 or 2 weeks ago. Says that if I had looked at it 1 or 2 weeks ago he would've looked about the same as it does now. Says that the site really is not painful and in the visit he is touching it and pushing on it and does not appear to be in any pain.     Home Meds:   Outpatient Prescriptions Prior to Visit  Medication Sig Dispense Refill  . albuterol (PROVENTIL) (2.5 MG/3ML) 0.083% nebulizer solution INHALE 1 VIAL VIA NEBULIZER EVERY 6 HOURS AS NEEDED FOR WHEEZING OR SHORTNESS OF BREATH 75 vial 5  . aspirin 81 MG EC tablet TAKE 1 TABLET BY MOUTH ONCE DAILY 30 tablet 5  . azithromycin (ZITHROMAX) 250 MG tablet Day 1: Take 2 daily. Days 2-5: Take 1 daily. 6 tablet 0  . budesonide (PULMICORT) 0.5 MG/2ML nebulizer solution Take 2 mLs (0.5 mg total) by nebulization 2 (two) times daily. 120 mL 12  . EASY TOUCH PEN NEEDLES 31G X 8 MM MISC USE 1 PEN NEEDLE ONCE DAILY AS DIRECTED 100 each 5  . Insulin Detemir (LEVEMIR FLEXTOUCH) 100 UNIT/ML Pen Inject 20 units at bedtime 15 mL 11  . loratadine (ALLERGY RELIEF) 10 MG tablet Take 10 mg by mouth at bedtime as needed for allergies.    Marland Kitchen losartan (COZAAR) 100 MG tablet TAKE 1 TABLET BY MOUTH ONCE DAILY 30 tablet 3  . metFORMIN (GLUCOPHAGE) 1000 MG tablet  TAKE 1 TABLET BY MOUTH 2 TIMES A DAY 60 tablet 3  . Omega-3 Fatty Acids (FISH OIL) 1000 MG CAPS Take 1 capsule by mouth 2 (two) times daily.     . ONE TOUCH ULTRA TEST test strip CHECK FASTING BLOOD SUGAR TWICE DAILY 100 each 3  . ONETOUCH DELICA LANCETS 27O MISC USE TO CHECK BLOOD SUGAR TWICE DAILY AS DIRECTED 100 each 5  . pravastatin (PRAVACHOL) 80 MG tablet TAKE 1 TABLET BY MOUTH ONCE DAILY AT BEDTIME 30 tablet 3  . predniSONE (DELTASONE) 20 MG tablet Take 2 daily for 5 days 10 tablet 0  . Tiotropium Bromide-Olodaterol (STIOLTO RESPIMAT) 2.5-2.5 MCG/ACT AERS Inhale 2 Inhalers into the lungs daily. (Patient not taking: Reported on 02/09/2015) 1 Inhaler 5   No facility-administered medications prior to visit.    Allergies:  Allergies  Allergen Reactions  . Ace Inhibitors     Hyperkalemia--07/23/2013      Review of Systems: See HPI for pertinent ROS. All other ROS negative.    Physical Exam: Blood pressure 138/70, pulse 78, temperature 98.2 F (36.8 C), temperature source Oral, resp. rate 18, weight 164 lb (74.39 kg)., Body mass index is 26.48 kg/(m^2). General:  WNWD WM. Appears in no acute distress. Neck: Supple. No thyromegaly. No lymphadenopathy. Lungs: Clear bilaterally to auscultation without wheezes, rales, or rhonchi. Breathing is unlabored. Heart: Regular rhythm. No murmurs, rubs, or gallops. Msk:  Strength and tone normal for 80. Extremities/Skin:  Left 3rd Finger: Dorsal Aspect: Area between MCP joint and PIP joint--is area/site.  2cm diameter area of erythema. In center of this, there is a 87mm opening.  I firmly palpated site. Small drop of purulent drainage comes out of opening. No underlying firm area present to represent abscess. No tenderness with this firm palpation.  Only this 2 cm area has erythema. Remainder of the finger is completely normal color with no pink coloration at all. The remainder of the finger also has no swelling and appears completely  normal. Even that area between the MCP joint and the DIP joint--on the plantar surface--is normal. Neuro: Alert and oriented X 3. Moves all extremities spontaneously. Gait is normal. CNII-XII grossly in tact. Psych:  Responds to questions appropriately with a normal affect.     ASSESSMENT AND PLAN:  80 y.o. year old male with  1. Cellulitis of finger of left hand Burn with secondary bacterial infection - Culture, routine-abscess - cephALEXin (KEFLEX) 500 MG capsule; Take 1 capsule (500 mg total) by mouth 4 (four) times daily.  Dispense: 28 capsule; Refill: 0 He is to start antibiotic immediately and take as directed and complete entire coarse of abx. Follow-up if site worsens or if site does not return to normal by the time he completes the antibiotic. As well, will follow-up results of culture.   [address] [place], [place], [facility] 03/18/2015 4:13 PM

## 2015-03-19 DIAGNOSIS — J449 Chronic obstructive pulmonary disease, unspecified: Secondary | ICD-10-CM | POA: Diagnosis not present

## 2015-03-21 LAB — CULTURE, ROUTINE-ABSCESS: Gram Stain: NONE SEEN

## 2015-03-22 ENCOUNTER — Encounter: Payer: Self-pay | Admitting: *Deleted

## 2015-03-22 ENCOUNTER — Other Ambulatory Visit: Payer: Self-pay | Admitting: *Deleted

## 2015-03-22 ENCOUNTER — Telehealth: Payer: Self-pay | Admitting: Family Medicine

## 2015-03-22 MED ORDER — SULFAMETHOXAZOLE-TRIMETHOPRIM 800-160 MG PO TABS: 1.0000 | ORAL_TABLET | Freq: Two times a day (BID) | ORAL | Status: DC

## 2015-03-22 NOTE — Telephone Encounter (Signed)
-----   Message from Orlena Sheldon, PA-C sent at 03/22/2015  7:49 AM EDT ----- Tell patient that the antibiotic I prescribed is not going to kill the infection he has. His infection is resistant to this antibiotic. Tell him to stop the Keflex and change to: Bactrim DS one by mouth twice a day 14 days #28+0 ADD TO PROBLEM LIST: MRSA FINGER

## 2015-03-22 NOTE — Patient Outreach (Signed)
Call to patient for [facility] care management program services.  Patient reporting no changes to his health, he states his blood sugars had been up some and he was checking them more often and keeping a record. He states they have been 150-200. He denies any change to diabetic medications.  Patient reports his breathing is "about the same" still using his oxygen as needed.  Patient denies any changes to his medications during call.  He states he saw the doctor last week and has a follow up appointment in July or August. RNCM instructed patient to take his CBG log to appointment but to also call if CBGs continue to be elevated. Patient voiced understanding.  Patient reporting he does not want to continue with [facility] care management program, stating "I do not see the need". RNCM offered telephonic outreach versus community, patient declines at this time, states he could call if he feels he needs anything RNCM confirmed that patient has RNCM contact information, also the [facility] main line and our 24 hour nurse line for further questions or concerns.  Plan to close case per protocol.  Royetta Crochet. Laymond Purser, RN, BSN, [place] [phone]

## 2015-03-22 NOTE — Telephone Encounter (Signed)
Pt made aware of results and need to change Rx.  New Rx to pharmacy

## 2015-03-23 NOTE — Patient Outreach (Signed)
[facility]) Care Management  03/23/2015  Edward [name] 19-Apr-1935 [id number]   Notification from Burgess Amor, RN to close case due to patient withdrew from [place] Management services.  [name]. [place], [place] Management [place] Assistant Phone: [phone] Fax: [phone]

## 2015-04-02 ENCOUNTER — Encounter: Payer: Self-pay | Admitting: Family Medicine

## 2015-04-02 DIAGNOSIS — J439 Emphysema, unspecified: Secondary | ICD-10-CM

## 2015-04-02 NOTE — Telephone Encounter (Signed)
error 

## 2015-04-11 DIAGNOSIS — J441 Chronic obstructive pulmonary disease with (acute) exacerbation: Secondary | ICD-10-CM | POA: Diagnosis not present

## 2015-04-11 DIAGNOSIS — R0600 Dyspnea, unspecified: Secondary | ICD-10-CM | POA: Diagnosis not present

## 2015-04-11 DIAGNOSIS — J8410 Pulmonary fibrosis, unspecified: Secondary | ICD-10-CM | POA: Diagnosis not present

## 2015-04-11 DIAGNOSIS — J40 Bronchitis, not specified as acute or chronic: Secondary | ICD-10-CM | POA: Diagnosis not present

## 2015-04-18 DIAGNOSIS — J449 Chronic obstructive pulmonary disease, unspecified: Secondary | ICD-10-CM | POA: Diagnosis not present

## 2015-04-23 DIAGNOSIS — J40 Bronchitis, not specified as acute or chronic: Secondary | ICD-10-CM | POA: Diagnosis not present

## 2015-04-23 DIAGNOSIS — J8410 Pulmonary fibrosis, unspecified: Secondary | ICD-10-CM | POA: Diagnosis not present

## 2015-04-23 DIAGNOSIS — J441 Chronic obstructive pulmonary disease with (acute) exacerbation: Secondary | ICD-10-CM | POA: Diagnosis not present

## 2015-04-23 DIAGNOSIS — R0600 Dyspnea, unspecified: Secondary | ICD-10-CM | POA: Diagnosis not present

## 2015-05-09 ENCOUNTER — Encounter (HOSPITAL_COMMUNITY): Payer: Self-pay | Admitting: Emergency Medicine

## 2015-05-09 ENCOUNTER — Emergency Department (HOSPITAL_COMMUNITY): Payer: Medicare Other

## 2015-05-09 ENCOUNTER — Inpatient Hospital Stay (HOSPITAL_COMMUNITY)
Admission: EM | Admit: 2015-05-09 | Discharge: 2015-05-10 | DRG: 603 | Disposition: A | Discharge status: Home / Self Care | Payer: Medicare Other | Attending: Internal Medicine | Admitting: Internal Medicine

## 2015-05-09 DIAGNOSIS — Z7982 Long term (current) use of aspirin: Secondary | ICD-10-CM

## 2015-05-09 DIAGNOSIS — I480 Paroxysmal atrial fibrillation: Secondary | ICD-10-CM

## 2015-05-09 DIAGNOSIS — Z79899 Other long term (current) drug therapy: Secondary | ICD-10-CM

## 2015-05-09 DIAGNOSIS — R739 Hyperglycemia, unspecified: Secondary | ICD-10-CM

## 2015-05-09 DIAGNOSIS — I519 Heart disease, unspecified: Secondary | ICD-10-CM

## 2015-05-09 DIAGNOSIS — Z888 Allergy status to other drugs, medicaments and biological substances status: Secondary | ICD-10-CM

## 2015-05-09 DIAGNOSIS — J309 Allergic rhinitis, unspecified: Secondary | ICD-10-CM | POA: Diagnosis not present

## 2015-05-09 DIAGNOSIS — I4891 Unspecified atrial fibrillation: Secondary | ICD-10-CM | POA: Diagnosis present

## 2015-05-09 DIAGNOSIS — E559 Vitamin D deficiency, unspecified: Secondary | ICD-10-CM | POA: Diagnosis not present

## 2015-05-09 DIAGNOSIS — H05012 Cellulitis of left orbit: Secondary | ICD-10-CM | POA: Diagnosis not present

## 2015-05-09 DIAGNOSIS — Z86010 Personal history of colonic polyps: Secondary | ICD-10-CM

## 2015-05-09 DIAGNOSIS — J8410 Pulmonary fibrosis, unspecified: Secondary | ICD-10-CM | POA: Diagnosis present

## 2015-05-09 DIAGNOSIS — Z9981 Dependence on supplemental oxygen: Secondary | ICD-10-CM

## 2015-05-09 DIAGNOSIS — H00036 Abscess of eyelid left eye, unspecified eyelid: Secondary | ICD-10-CM | POA: Diagnosis not present

## 2015-05-09 DIAGNOSIS — J449 Chronic obstructive pulmonary disease, unspecified: Secondary | ICD-10-CM | POA: Diagnosis present

## 2015-05-09 DIAGNOSIS — I10 Essential (primary) hypertension: Secondary | ICD-10-CM | POA: Diagnosis present

## 2015-05-09 DIAGNOSIS — E1165 Type 2 diabetes mellitus with hyperglycemia: Secondary | ICD-10-CM | POA: Diagnosis present

## 2015-05-09 DIAGNOSIS — J438 Other emphysema: Secondary | ICD-10-CM | POA: Diagnosis not present

## 2015-05-09 DIAGNOSIS — E785 Hyperlipidemia, unspecified: Secondary | ICD-10-CM | POA: Diagnosis present

## 2015-05-09 DIAGNOSIS — H00034 Abscess of left upper eyelid: Secondary | ICD-10-CM | POA: Diagnosis not present

## 2015-05-09 DIAGNOSIS — L03213 Periorbital cellulitis: Secondary | ICD-10-CM | POA: Diagnosis present

## 2015-05-09 DIAGNOSIS — L03211 Cellulitis of face: Secondary | ICD-10-CM | POA: Diagnosis not present

## 2015-05-09 DIAGNOSIS — J9610 Chronic respiratory failure, unspecified whether with hypoxia or hypercapnia: Secondary | ICD-10-CM | POA: Diagnosis present

## 2015-05-09 DIAGNOSIS — I5189 Other ill-defined heart diseases: Secondary | ICD-10-CM | POA: Diagnosis present

## 2015-05-09 DIAGNOSIS — H00035 Abscess of left lower eyelid: Secondary | ICD-10-CM | POA: Diagnosis not present

## 2015-05-09 DIAGNOSIS — E780 Pure hypercholesterolemia: Secondary | ICD-10-CM | POA: Diagnosis present

## 2015-05-09 DIAGNOSIS — Z87891 Personal history of nicotine dependence: Secondary | ICD-10-CM

## 2015-05-09 DIAGNOSIS — Z794 Long term (current) use of insulin: Secondary | ICD-10-CM

## 2015-05-09 LAB — BASIC METABOLIC PANEL
ANION GAP: 11 (ref 5–15)
BUN: 20 mg/dL (ref 6–20)
CO2: 26 mmol/L (ref 22–32)
CREATININE: 1.02 mg/dL (ref 0.61–1.24)
Calcium: 9 mg/dL (ref 8.9–10.3)
Chloride: 94 mmol/L — ABNORMAL LOW (ref 101–111)
GFR calc Af Amer: >60 mL/min (ref >60)
GFR calc non Af Amer: >60 mL/min (ref >60)
GLUCOSE: 451 mg/dL — AB (ref 65–99)
Potassium: 4.7 mmol/L (ref 3.5–5.1)
SODIUM: 131 mmol/L — AB (ref 135–145)

## 2015-05-09 LAB — CBC WITH DIFFERENTIAL/PLATELET
BASOS ABS: 0.1 10*3/uL (ref 0.0–0.1)
BASOS PCT: 1 % (ref 0–1)
EOS PCT: 6 % — AB (ref 0–5)
Eosinophils Absolute: 0.5 10*3/uL (ref 0.0–0.7)
HEMATOCRIT: 41.2 % (ref 39.0–52.0)
HEMOGLOBIN: 13.2 g/dL (ref 13.0–17.0)
Lymphocytes Relative: 17 % (ref 12–46)
Lymphs Abs: 1.6 10*3/uL (ref 0.7–4.0)
MCH: 25.5 pg — AB (ref 26.0–34.0)
MCHC: 32 g/dL (ref 30.0–36.0)
MCV: 79.7 fL (ref 78.0–100.0)
Monocytes Absolute: 1 10*3/uL (ref 0.1–1.0)
Monocytes Relative: 11 % (ref 3–12)
NEUTROS ABS: 6 10*3/uL (ref 1.7–7.7)
NEUTROS PCT: 65 % (ref 43–77)
Platelets: 332 10*3/uL (ref 150–400)
RBC: 5.17 MIL/uL (ref 4.22–5.81)
RDW: 15.2 % (ref 11.5–15.5)
WBC: 9.1 10*3/uL (ref 4.0–10.5)

## 2015-05-09 LAB — GLUCOSE, CAPILLARY: Glucose-Capillary: 229 mg/dL — ABNORMAL HIGH (ref 65–99)

## 2015-05-09 LAB — CBG MONITORING, ED
GLUCOSE-CAPILLARY: 480 mg/dL — AB (ref 65–99)
GLUCOSE-CAPILLARY: 326 mg/dL — AB (ref 65–99)
GLUCOSE-CAPILLARY: 329 mg/dL — AB (ref 65–99)
Glucose-Capillary: 391 mg/dL — ABNORMAL HIGH (ref 65–99)

## 2015-05-09 MED ORDER — IOHEXOL 300 MG/ML  SOLN
75.0000 mL | Freq: Once | INTRAMUSCULAR | Status: AC | PRN
  Administered 2015-05-09: 75 mL via INTRAVENOUS

## 2015-05-09 MED ORDER — INSULIN ASPART 100 UNIT/ML ~~LOC~~ SOLN
0.0000 [IU] | Freq: Three times a day (TID) | SUBCUTANEOUS | Status: DC
  Administered 2015-05-10: 2 [IU] via SUBCUTANEOUS
  Administered 2015-05-10: 5 [IU] via SUBCUTANEOUS

## 2015-05-09 MED ORDER — SODIUM CHLORIDE 0.9 % IJ SOLN: 3.0000 mL | Freq: Two times a day (BID) | INTRAMUSCULAR | Status: DC

## 2015-05-09 MED ORDER — ONDANSETRON HCL 4 MG PO TABS: 4.0000 mg | ORAL_TABLET | Freq: Four times a day (QID) | ORAL | Status: DC | PRN

## 2015-05-09 MED ORDER — ONDANSETRON HCL 4 MG/2ML IJ SOLN: 4.0000 mg | Freq: Four times a day (QID) | INTRAMUSCULAR | Status: DC | PRN

## 2015-05-09 MED ORDER — ENOXAPARIN SODIUM 40 MG/0.4ML ~~LOC~~ SOLN
40.0000 mg | SUBCUTANEOUS | Status: DC
  Administered 2015-05-09: 40 mg via SUBCUTANEOUS
  Filled 2015-05-09: qty 0.4

## 2015-05-09 MED ORDER — INSULIN ASPART 100 UNIT/ML ~~LOC~~ SOLN
0.0000 [IU] | Freq: Every day | SUBCUTANEOUS | Status: DC
  Administered 2015-05-09: 2 [IU] via SUBCUTANEOUS

## 2015-05-09 MED ORDER — CLINDAMYCIN PHOSPHATE 600 MG/50ML IV SOLN
600.0000 mg | Freq: Once | INTRAVENOUS | Status: AC
  Administered 2015-05-09: 600 mg via INTRAVENOUS
  Filled 2015-05-09: qty 50

## 2015-05-09 MED ORDER — ACETAMINOPHEN 650 MG RE SUPP: 650.0000 mg | Freq: Four times a day (QID) | RECTAL | Status: DC | PRN

## 2015-05-09 MED ORDER — CLINDAMYCIN PHOSPHATE 600 MG/50ML IV SOLN
INTRAVENOUS | Status: AC
  Filled 2015-05-09: qty 100

## 2015-05-09 MED ORDER — SODIUM CHLORIDE 0.9 % IV SOLN: INTRAVENOUS | Status: DC

## 2015-05-09 MED ORDER — CLINDAMYCIN PHOSPHATE 600 MG/50ML IV SOLN
600.0000 mg | Freq: Three times a day (TID) | INTRAVENOUS | Status: DC
  Administered 2015-05-09 – 2015-05-10 (×2): 600 mg via INTRAVENOUS
  Filled 2015-05-09 (×6): qty 50

## 2015-05-09 MED ORDER — SODIUM CHLORIDE 0.9 % IV SOLN: 250.0000 mL | INTRAVENOUS | Status: DC | PRN

## 2015-05-09 MED ORDER — ACETAMINOPHEN 325 MG PO TABS: 650.0000 mg | ORAL_TABLET | Freq: Four times a day (QID) | ORAL | Status: DC | PRN

## 2015-05-09 MED ORDER — INSULIN ASPART 100 UNIT/ML ~~LOC~~ SOLN
5.0000 [IU] | Freq: Once | SUBCUTANEOUS | Status: AC
  Administered 2015-05-09: 5 [IU] via SUBCUTANEOUS
  Filled 2015-05-09: qty 1

## 2015-05-09 MED ORDER — SODIUM CHLORIDE 0.9 % IV BOLUS (SEPSIS)
1000.0000 mL | Freq: Once | INTRAVENOUS | Status: AC
  Administered 2015-05-09: 1000 mL via INTRAVENOUS

## 2015-05-09 MED ORDER — SODIUM CHLORIDE 0.9 % IJ SOLN: 3.0000 mL | INTRAMUSCULAR | Status: DC | PRN

## 2015-05-09 NOTE — ED Notes (Signed)
Report given to Cindy, all questions answered  

## 2015-05-09 NOTE — ED Provider Notes (Signed)
CSN: [id number]     Arrival date & time 05/09/15  1519 History   First MD Initiated Contact with Patient 05/09/15 1540     Chief Complaint  Patient presents with  . Facial Swelling     (Consider location/radiation/quality/duration/timing/severity/associated sxs/prior Treatment) HPI Comments: 1 week of irritation to his left eye with one day of drainage and swelling and pain. Patient states his eyes became irritated and has been using an unknown eyedrop without relief. Denies fever. Endorses some blurry vision. Today he had purulent drainage, swelling of the eyelid and worsening blurry vision. He does not wear glasses or contacts. No chest pain or shortness of breath. No headache. History of severe COPD and states his breathing is at baseline. He does not have an eye doctor. Blood sugar on arrival is over 400.  The history is provided by the patient.    Past Medical History  Diagnosis Date  . Diabetes mellitus   . COPD (chronic obstructive pulmonary disease)   . Hypertension   . Allergy     Rhinitis  . Elevated lipids   . Pulmonary fibrosis   . Bronchitis   . Colon polyps   . Vitamin D deficiency   . PSA elevation   . Chronic respiratory failure   . Hypercholesterolemia   . On home O2     2L N/C    Past Surgical History  Procedure Laterality Date  . Cataract extraction w/phaco  06/25/2012    Procedure: CATARACT EXTRACTION PHACO AND INTRAOCULAR LENS PLACEMENT (IOC);  Surgeon: Elta Guadeloupe T. Gershon Crane, MD;  Location: [facility];  Service: Ophthalmology;  Laterality: Left;  CDE=19.01  . Cataract extraction w/phaco  07/09/2012    Procedure: CATARACT EXTRACTION PHACO AND INTRAOCULAR LENS PLACEMENT (IOC);  Surgeon: Elta Guadeloupe T. Gershon Crane, MD;  Location: [facility];  Service: Ophthalmology;  Laterality: Right;  CDE: 20.09   Family History  Problem Relation Age of Onset  . CAD Other   . Diabetes Other   . Heart disease Mother    History  Substance Use Topics  . Smoking status: Former Smoker -- 1.50  packs/day for 60 years    Types: Cigarettes    Quit date: 12/31/2012  . Smokeless tobacco: Never Used  . Alcohol Use: No    Review of Systems  Constitutional: Negative for activity change and appetite change.  HENT: Negative for congestion and postnasal drip.   Eyes: Positive for pain, discharge and redness.  Respiratory: Negative for apnea, cough, chest tightness and shortness of breath.   Cardiovascular: Negative for chest pain.  Gastrointestinal: Negative for nausea, vomiting and abdominal pain.  Genitourinary: Negative for dysuria, hematuria and testicular pain.  Musculoskeletal: Negative for myalgias and arthralgias.  Skin: Negative for rash.  Neurological: Negative for dizziness, weakness, light-headedness and headaches.  A complete 10 system review of systems was obtained and all systems are negative except as noted in the HPI and PMH.      Allergies  Ace inhibitors  Home Medications   Prior to Admission medications   Medication Sig Start Date End Date Taking? Authorizing Provider  albuterol (PROVENTIL) (2.5 MG/3ML) 0.083% nebulizer solution INHALE 1 VIAL VIA NEBULIZER EVERY 6 HOURS AS NEEDED FOR WHEEZING OR SHORTNESS OF BREATH 01/15/15  Yes Orlena Sheldon, PA-C  aspirin 81 MG EC tablet TAKE 1 TABLET BY MOUTH ONCE DAILY 09/04/14  Yes Lonie Peak Dixon, PA-C  budesonide (PULMICORT) 0.5 MG/2ML nebulizer solution Take 2 mLs (0.5 mg total) by nebulization 2 (two) times daily. 10/30/14  Yes Orlena Sheldon, PA-C  Insulin Detemir (LEVEMIR FLEXTOUCH) 100 UNIT/ML Pen Inject 20 units at bedtime Patient taking differently: Inject 20 Units into the skin at bedtime. Inject 20 units at bedtime 11/25/14  Yes Orlena Sheldon, PA-C  loratadine (ALLERGY RELIEF) 10 MG tablet Take 10 mg by mouth at bedtime as needed for allergies.   Yes Historical Provider, MD  losartan (COZAAR) 100 MG tablet TAKE 1 TABLET BY MOUTH ONCE DAILY 03/12/15  Yes Orlena Sheldon, PA-C  metFORMIN (GLUCOPHAGE) 1000 MG tablet TAKE 1  TABLET BY MOUTH 2 TIMES A DAY 03/12/15  Yes Orlena Sheldon, PA-C  Omega-3 Fatty Acids (FISH OIL) 1000 MG CAPS Take 1 capsule by mouth 2 (two) times daily.    Yes Historical Provider, MD  pravastatin (PRAVACHOL) 80 MG tablet TAKE 1 TABLET BY MOUTH ONCE DAILY AT BEDTIME 03/12/15  Yes Mary B Dixon, PA-C  PROAIR RESPICLICK 092 (90 BASE) MCG/ACT AEPB Inhale 1-2 puffs into the lungs every 6 (six) hours as needed (for shortness of breath/wheezing).  12/14/14  Yes Historical Provider, MD  azithromycin (ZITHROMAX) 250 MG tablet Day 1: Take 2 daily. Days 2-5: Take 1 daily. Patient not taking: Reported on 05/09/2015 02/25/15   Orlena Sheldon, PA-C  EASY TOUCH PEN NEEDLES 31G X 8 MM MISC USE 1 PEN NEEDLE ONCE DAILY AS DIRECTED 09/04/14   Orlena Sheldon, PA-C  ONE TOUCH ULTRA TEST test strip CHECK FASTING BLOOD SUGAR TWICE DAILY 03/12/15   Orlena Sheldon, PA-C  ONETOUCH DELICA LANCETS 33A MISC USE TO CHECK BLOOD SUGAR TWICE DAILY AS DIRECTED 09/04/14   Orlena Sheldon, PA-C  predniSONE (DELTASONE) 20 MG tablet Take 2 daily for 5 days Patient not taking: Reported on 05/09/2015 02/25/15   Orlena Sheldon, PA-C  sulfamethoxazole-trimethoprim (BACTRIM DS,SEPTRA DS) 800-160 MG per tablet Take 1 tablet by mouth 2 (two) times daily. Patient not taking: Reported on 05/09/2015 03/22/15   Orlena Sheldon, PA-C  Tiotropium Bromide-Olodaterol (STIOLTO RESPIMAT) 2.5-2.5 MCG/ACT AERS Inhale 2 Inhalers into the lungs daily. Patient not taking: Reported on 02/09/2015 12/29/14   Susy Frizzle, MD   BP 156/91 mmHg  Pulse 84  Temp(Src) 98.2 F (36.8 C) (Oral)  Resp 20  Ht 5\' 5"  (1.651 m)  Wt 161 lb 9.6 oz (73.3 kg)  BMI 26.89 kg/m2  SpO2 95% Physical Exam  Constitutional: He is oriented to person, place, and time. He appears well-developed and well-nourished. No distress.  HENT:  Head: Normocephalic and atraumatic.  Mouth/Throat: Oropharynx is clear and moist. No oropharyngeal exudate.  Eyes: Conjunctivae and EOM are normal. Pupils are equal,  round, and reactive to light. Left eye exhibits discharge.  Erythema, swelling to left upper and lower lid. There is purulent drainage. Patient has minimal pain with extraocular movements. Pupils are equal and reactive.  Neck: Normal range of motion. Neck supple.  No meningismus.  Cardiovascular: Normal rate, regular rhythm, normal heart sounds and intact distal pulses.   No murmur heard. Pulmonary/Chest: Effort normal and breath sounds normal. No respiratory distress.  Abdominal: Soft. There is no tenderness. There is no rebound and no guarding.  Musculoskeletal: Normal range of motion. He exhibits no edema or tenderness.  Neurological: He is alert and oriented to person, place, and time. No cranial nerve deficit. He exhibits normal muscle tone. Coordination normal.  No ataxia on finger to nose bilaterally. No pronator drift. 5/5 strength throughout. CN 2-12 intact. Negative Romberg. Equal grip strength. Sensation intact. Gait is  normal.   Skin: Skin is warm.  Psychiatric: He has a normal mood and affect. His behavior is normal.  Nursing note and vitals reviewed.     ED Course  Procedures (including critical care time) Labs Review Labs Reviewed  CBC WITH DIFFERENTIAL/PLATELET - Abnormal; Notable for the following:    MCH 25.5 (*)    Eosinophils Relative 6 (*)    All other components within normal limits  BASIC METABOLIC PANEL - Abnormal; Notable for the following:    Sodium 131 (*)    Chloride 94 (*)    Glucose, Bld 451 (*)    All other components within normal limits  GLUCOSE, CAPILLARY - Abnormal; Notable for the following:    Glucose-Capillary 229 (*)    All other components within normal limits  CBG MONITORING, ED - Abnormal; Notable for the following:    Glucose-Capillary 391 (*)    All other components within normal limits  CBG MONITORING, ED - Abnormal; Notable for the following:    Glucose-Capillary 480 (*)    All other components within normal limits  CBG MONITORING,  ED - Abnormal; Notable for the following:    Glucose-Capillary 329 (*)    All other components within normal limits  CBG MONITORING, ED - Abnormal; Notable for the following:    Glucose-Capillary 326 (*)    All other components within normal limits  BASIC METABOLIC PANEL  CBC    Imaging Review Ct Orbits W/cm  05/09/2015   CLINICAL DATA:  Left eye soreness x1 week, redness/swelling  EXAM: CT ORBITS WITH CONTRAST  TECHNIQUE: Multidetector CT imaging of the orbits was performed following the bolus administration of intravenous contrast.  CONTRAST:  65mL OMNIPAQUE IOHEXOL 300 MG/ML  SOLN  COMPARISON:  None.  FINDINGS: Preseptal soft tissue swelling overlying the left orbit (series 2/ image 20). Underlying globe and retroconal soft tissues are within normal limits.  Right orbit is within normal limits.  Partial opacification/ mucosal thickening of the bilateral maxillary sinuses. Visualized paranasal sinuses and mastoid air cells are otherwise clear.  No evidence of maxillofacial fracture.  IMPRESSION: Preseptal cellulitis overlying the left orbit.  Underlying globe and retroconal soft tissues are within normal limits.   Electronically Signed   By: Julian Hy M.D.   On: 05/09/2015 18:43     EKG Interpretation None      MDM   Final diagnoses:  Preseptal cellulitis, left  Hyperglycemia   left eye pain, swelling, drainage with swelling of eyelids. Exam consistent with preseptal cellulitis and possible intralid abscess. Minimal pain with extraocular movements.  Hyperglycemia without DKA. CT shows preseptal cellulitis without underlying abscess or septal cellulitis.  Visual acuity as below.  Visual Acuity - Bilateral Distance: 20/30 ; R Distance: 20/30 ; L Distance: 20/40  Patient not confident he will be able to see ophtho tomorrow.  With age and uncontrolled blood sugar, will admit for IV antibiotics.  D/w Dr. Shanon Brow.   Ezequiel Essex, MD 05/09/15 2251

## 2015-05-09 NOTE — ED Notes (Signed)
Patient reports left eye feeling sore x1 week with swelling to eyelid today. Patient reports using renophase eye drops after swelling started. Per patient also used ice pack to eye with no relief. Clear drainage.

## 2015-05-09 NOTE — H&P (Signed)
PCP:   Edward Juba, PA-C   Chief Complaint:  Left eye swelling and red  HPI: 80 yo male h/o dm, copd, pulm fibrosis comes to ED with over one week of worsening left upper eyelid swelling and redness.  No trauma to eye.  Eye not painful.  Can move eye and has no pain.  No recent sores in area.  Ct scan shows no orbital involvement or absess in area.  Referred for admission for infection around his eye.  Pt reports no fevers, n/v/d.  Is feeling well otherwise.  Review of Systems:  Positive and negative as per HPI otherwise all other systems are negative  Past Medical History: Past Medical History  Diagnosis Date  . Diabetes mellitus   . COPD (chronic obstructive pulmonary disease)   . Hypertension   . Allergy     Rhinitis  . Elevated lipids   . Pulmonary fibrosis   . Bronchitis   . Colon polyps   . Vitamin D deficiency   . PSA elevation   . Chronic respiratory failure   . Hypercholesterolemia   . On home O2     2L N/C    Past Surgical History  Procedure Laterality Date  . Cataract extraction w/phaco  06/25/2012    Procedure: CATARACT EXTRACTION PHACO AND INTRAOCULAR LENS PLACEMENT (IOC);  Surgeon: Elta Guadeloupe T. Gershon Crane, MD;  Location: [facility];  Service: Ophthalmology;  Laterality: Left;  CDE=19.01  . Cataract extraction w/phaco  07/09/2012    Procedure: CATARACT EXTRACTION PHACO AND INTRAOCULAR LENS PLACEMENT (IOC);  Surgeon: Elta Guadeloupe T. Gershon Crane, MD;  Location: [facility];  Service: Ophthalmology;  Laterality: Right;  CDE: 20.09    Medications: Prior to Admission medications   Medication Sig Start Date End Date Taking? Authorizing Provider  albuterol (PROVENTIL) (2.5 MG/3ML) 0.083% nebulizer solution INHALE 1 VIAL VIA NEBULIZER EVERY 6 HOURS AS NEEDED FOR WHEEZING OR SHORTNESS OF BREATH 01/15/15  Yes Orlena Sheldon, PA-C  aspirin 81 MG EC tablet TAKE 1 TABLET BY MOUTH ONCE DAILY 09/04/14  Yes Lonie Peak Dixon, PA-C  budesonide (PULMICORT) 0.5 MG/2ML nebulizer solution Take 2 mLs (0.5 mg total)  by nebulization 2 (two) times daily. 10/30/14  Yes Mary B Dixon, PA-C  Insulin Detemir (LEVEMIR FLEXTOUCH) 100 UNIT/ML Pen Inject 20 units at bedtime Patient taking differently: Inject 20 Units into the skin at bedtime. Inject 20 units at bedtime 11/25/14  Yes Orlena Sheldon, PA-C  loratadine (ALLERGY RELIEF) 10 MG tablet Take 10 mg by mouth at bedtime as needed for allergies.   Yes Historical Provider, MD  losartan (COZAAR) 100 MG tablet TAKE 1 TABLET BY MOUTH ONCE DAILY 03/12/15  Yes Orlena Sheldon, PA-C  metFORMIN (GLUCOPHAGE) 1000 MG tablet TAKE 1 TABLET BY MOUTH 2 TIMES A DAY 03/12/15  Yes Orlena Sheldon, PA-C  Omega-3 Fatty Acids (FISH OIL) 1000 MG CAPS Take 1 capsule by mouth 2 (two) times daily.    Yes Historical Provider, MD  pravastatin (PRAVACHOL) 80 MG tablet TAKE 1 TABLET BY MOUTH ONCE DAILY AT BEDTIME 03/12/15  Yes Mary B Dixon, PA-C  PROAIR RESPICLICK 528 (90 BASE) MCG/ACT AEPB Inhale 1-2 puffs into the lungs every 6 (six) hours as needed (for shortness of breath/wheezing).  12/14/14  Yes Historical Provider, MD  azithromycin (ZITHROMAX) 250 MG tablet Day 1: Take 2 daily. Days 2-5: Take 1 daily. Patient not taking: Reported on 05/09/2015 02/25/15   Orlena Sheldon, PA-C  EASY TOUCH PEN NEEDLES 31G X 8 MM MISC USE  1 PEN NEEDLE ONCE DAILY AS DIRECTED 09/04/14   Orlena Sheldon, PA-C  ONE TOUCH ULTRA TEST test strip CHECK FASTING BLOOD SUGAR TWICE DAILY 03/12/15   Orlena Sheldon, PA-C  ONETOUCH DELICA LANCETS 92J MISC USE TO CHECK BLOOD SUGAR TWICE DAILY AS DIRECTED 09/04/14   Orlena Sheldon, PA-C  predniSONE (DELTASONE) 20 MG tablet Take 2 daily for 5 days Patient not taking: Reported on 05/09/2015 02/25/15   Orlena Sheldon, PA-C  sulfamethoxazole-trimethoprim (BACTRIM DS,SEPTRA DS) 800-160 MG per tablet Take 1 tablet by mouth 2 (two) times daily. Patient not taking: Reported on 05/09/2015 03/22/15   Orlena Sheldon, PA-C  Tiotropium Bromide-Olodaterol (STIOLTO RESPIMAT) 2.5-2.5 MCG/ACT AERS Inhale 2 Inhalers into the lungs  daily. Patient not taking: Reported on 02/09/2015 12/29/14   Susy Frizzle, MD    Allergies:   Allergies  Allergen Reactions  . Ace Inhibitors     Hyperkalemia--07/23/2013:patient states not familiar with the following allergy    Social History:  reports that he quit smoking about 2 years ago. His smoking use included Cigarettes. He has a 90 pack-year smoking history. He has never used smokeless tobacco. He reports that he does not drink alcohol or use illicit drugs.  Family History: Family History  Problem Relation Age of Onset  . CAD Other   . Diabetes Other   . Heart disease Mother     Physical Exam: Filed Vitals:   05/09/15 1529  BP: 163/73  Pulse: 103  Temp: 98.1 F (36.7 C)  TempSrc: Oral  Resp: 20  Height: 5\' 5"  (1.651 m)  Weight: 72.576 kg (160 lb)  SpO2: 96%   General appearance: alert, cooperative and no distress Head: Normocephalic, without obvious abnormality, atraumatic Eyes: left upper eyelid with mod swelling and erythema.  mild drainage from eye conjunctive mild red.  no pain with movement of extraocular mm.  EOMI and PERRL.  c/w periorbital cellulitis. Nose: Nares normal. Septum midline. Mucosa normal. No drainage or sinus tenderness. Neck: no JVD and supple, symmetrical, trachea midline Lungs: clear to auscultation bilaterally Heart: regular rate and rhythm, S1, S2 normal, no murmur, click, rub or gallop Abdomen: soft, non-tender; bowel sounds normal; no masses,  no organomegaly Extremities: extremities normal, atraumatic, no cyanosis or edema Pulses: 2+ and symmetric Skin: Skin color, texture, turgor normal. No rashes or lesions x rash to left eye as mentioned above. Neurologic: Grossly normal   Labs on Admission:   Recent Labs  05/09/15 1608  NA 131*  K 4.7  CL 94*  CO2 26  GLUCOSE 451*  BUN 20  CREATININE 1.02  CALCIUM 9.0    Recent Labs  05/09/15 1608  WBC 9.1  NEUTROABS 6.0  HGB 13.2  HCT 41.2  MCV 79.7  PLT 332     Radiological Exams on Admission: Ct Orbits W/cm  05/09/2015   CLINICAL DATA:  Left eye soreness x1 week, redness/swelling  EXAM: CT ORBITS WITH CONTRAST  TECHNIQUE: Multidetector CT imaging of the orbits was performed following the bolus administration of intravenous contrast.  CONTRAST:  58mL OMNIPAQUE IOHEXOL 300 MG/ML  SOLN  COMPARISON:  None.  FINDINGS: Preseptal soft tissue swelling overlying the left orbit (series 2/ image 20). Underlying globe and retroconal soft tissues are within normal limits.  Right orbit is within normal limits.  Partial opacification/ mucosal thickening of the bilateral maxillary sinuses. Visualized paranasal sinuses and mastoid air cells are otherwise clear.  No evidence of maxillofacial fracture.  IMPRESSION: Preseptal cellulitis overlying the  left orbit.  Underlying globe and retroconal soft tissues are within normal limits.   Electronically Signed   By: Julian Hy M.D.   On: 05/09/2015 18:43    Assessment/Plan  80 yo male with left periorbital cellulitis and uncontrolled diabetes  Principal Problem:   Preseptal cellulitis-  Place on iv clindamycin.  No need for ophthamology involvement at this time unless does not improve with iv abx.  Ct scan very reassuring.  Active Problems:   COPD (chronic obstructive pulmonary disease)- stable, clarify home meds   Hypertension- stable , resume home meds once clarified   Pulmonary fibrosis- stable , cont home oxygen prn   Paroxysmal atrial fibrillation-  Rate controlled   Diastolic dysfunction- noted, no active issue at this time.  No need for ivf at this time.  Admit to medical bed.  Monitor response to antibiotics.    Lyndy Russman A 05/09/2015, 7:26 PM

## 2015-05-10 DIAGNOSIS — I10 Essential (primary) hypertension: Secondary | ICD-10-CM

## 2015-05-10 DIAGNOSIS — J438 Other emphysema: Secondary | ICD-10-CM

## 2015-05-10 DIAGNOSIS — J8410 Pulmonary fibrosis, unspecified: Secondary | ICD-10-CM

## 2015-05-10 LAB — BASIC METABOLIC PANEL
ANION GAP: 6 (ref 5–15)
BUN: 14 mg/dL (ref 6–20)
CO2: 30 mmol/L (ref 22–32)
Calcium: 8.7 mg/dL — ABNORMAL LOW (ref 8.9–10.3)
Chloride: 98 mmol/L — ABNORMAL LOW (ref 101–111)
Creatinine, Ser: 0.83 mg/dL (ref 0.61–1.24)
GFR calc Af Amer: >60 mL/min (ref >60)
GFR calc non Af Amer: >60 mL/min (ref >60)
Glucose, Bld: 248 mg/dL — ABNORMAL HIGH (ref 65–99)
Potassium: 4.3 mmol/L (ref 3.5–5.1)
Sodium: 134 mmol/L — ABNORMAL LOW (ref 135–145)

## 2015-05-10 LAB — CBC
HCT: 41.7 % (ref 39.0–52.0)
Hemoglobin: 13.1 g/dL (ref 13.0–17.0)
MCH: 25.1 pg — ABNORMAL LOW (ref 26.0–34.0)
MCHC: 31.4 g/dL (ref 30.0–36.0)
MCV: 79.9 fL (ref 78.0–100.0)
Platelets: 333 10*3/uL (ref 150–400)
RBC: 5.22 MIL/uL (ref 4.22–5.81)
RDW: 15.1 % (ref 11.5–15.5)
WBC: 8 10*3/uL (ref 4.0–10.5)

## 2015-05-10 LAB — GLUCOSE, CAPILLARY
GLUCOSE-CAPILLARY: 253 mg/dL — AB (ref 65–99)
Glucose-Capillary: 200 mg/dL — ABNORMAL HIGH (ref 65–99)

## 2015-05-10 MED ORDER — ERYTHROMYCIN 5 MG/GM OP OINT: 1.0000 "application " | TOPICAL_OINTMENT | Freq: Every day | OPHTHALMIC | Status: DC

## 2015-05-10 MED ORDER — CEFADROXIL 500 MG PO CAPS: 500.0000 mg | ORAL_CAPSULE | Freq: Two times a day (BID) | ORAL | Status: DC

## 2015-05-10 MED ORDER — CLINDAMYCIN PHOSPHATE 600 MG/50ML IV SOLN
INTRAVENOUS | Status: AC
  Filled 2015-05-10: qty 50

## 2015-05-10 NOTE — Discharge Summary (Addendum)
Physician Discharge Summary  Edward [name] ZSW:[id number] DOB: 02/13/1935 DOA: 05/09/2015  PCP: Karis Juba, PA-C  Admit date: 05/09/2015 Discharge date: 05/10/2015  Time spent: 35 minutes  Recommendations for Outpatient Follow-up:  1. Follow up with Dr Venetia Maxon Opthalmology in [place] at [facility] May 13, 2015.  Discharge Diagnoses:  Principal Problem:   Preseptal cellulitis Active Problems:   COPD (chronic obstructive pulmonary disease)   Hypertension   Pulmonary fibrosis   Paroxysmal atrial fibrillation   Diastolic dysfunction   Discharge Condition: stable.   Diet recommendation: Cardiac.   Filed Weights   05/09/15 1529 05/09/15 2116 05/10/15 0506  Weight: 72.576 kg (160 lb) 73.3 kg (161 lb 9.6 oz) 72.1 kg (158 lb 15.2 oz)    History of present illness: Patient was admitted by Dr Shanon Brow for question of periorbital cellulitis on May 09, 2015.  As per her H and P:  " 80 yo male h/o dm, copd, pulm fibrosis comes to ED with over one week of worsening left upper eyelid swelling and redness. No trauma to eye. Eye not painful. Can move eye and has no pain. No recent sores in area. Ct scan shows no orbital involvement or absess in area. Referred for admission for infection around his eye. Pt reports no fevers, n/v/d. Is feeling well otherwise.   Hospital Course: Patient was started on IV Clindamycin, and he has no trouble with eye pain, diplopia, or with any eye movement.  He does have crusty upper and lower lids, and it seems that he has blepharitis.  I am not convinced that he has periorbital cellulitis.  He does have some redness, which could be inflammatory in nature.  He is very anxious to go home.  He has hx of COPD, pulmonary fibrosis, and has been on home oxygen.  He also has DM.  I made an appointment of Dr Venetia Maxon in [facility] for May 13, 2015, at 3pm.  He will see Dr Venetia Maxon in follow up.  I will discharge him on Duracef 500mg  BID for 10 days along with Ilotycin ointment,  apply as directed every 3-4 hours for 10 days.  Thank you and Good day.    Discharge Exam: Filed Vitals:   05/10/15 0506  BP: 154/76  Pulse: 79  Temp: 98.1 F (36.7 C)  Resp: 20   Discharge Instructions   Discharge Instructions    Diet - low sodium heart healthy    Complete by:  As directed      Discharge instructions    Complete by:  As directed   Follow up with Dr Venetia Maxon in [place] at [place] on Aug 11 as Dr Marin Comment made appointment for you.     Increase activity slowly    Complete by:  As directed           Current Discharge Medication List    START taking these medications   Details  cefadroxil (DURICEF) 500 MG capsule Take 1 capsule (500 mg total) by mouth 2 (two) times daily. Qty: 20 capsule, Refills: 0    erythromycin ophthalmic ointment Place 1 application into the left eye at bedtime. Use as directed every 3 hours for 10 days Qty: 3.5 g, Refills: 0      CONTINUE these medications which have NOT CHANGED   Details  albuterol (PROVENTIL) (2.5 MG/3ML) 0.083% nebulizer solution INHALE 1 VIAL VIA NEBULIZER EVERY 6 HOURS AS NEEDED FOR WHEEZING OR SHORTNESS OF BREATH Qty: 75 vial, Refills: 5    aspirin 81 MG EC tablet  TAKE 1 TABLET BY MOUTH ONCE DAILY Qty: 30 tablet, Refills: 5   Associated Diagnoses: Diabetes mellitus without complication; HLD (hyperlipidemia)    budesonide (PULMICORT) 0.5 MG/2ML nebulizer solution Take 2 mLs (0.5 mg total) by nebulization 2 (two) times daily. Qty: 120 mL, Refills: 12    Insulin Detemir (LEVEMIR FLEXTOUCH) 100 UNIT/ML Pen Inject 20 units at bedtime Qty: 15 mL, Refills: 11    losartan (COZAAR) 100 MG tablet TAKE 1 TABLET BY MOUTH ONCE DAILY Qty: 30 tablet, Refills: 3    metFORMIN (GLUCOPHAGE) 1000 MG tablet TAKE 1 TABLET BY MOUTH 2 TIMES A DAY Qty: 60 tablet, Refills: 3    Omega-3 Fatty Acids (FISH OIL) 1000 MG CAPS Take 1 capsule by mouth 2 (two) times daily.     pravastatin (PRAVACHOL) 80 MG tablet TAKE 1 TABLET BY MOUTH  ONCE DAILY AT BEDTIME Qty: 30 tablet, Refills: 3    PROAIR RESPICLICK 509 (90 BASE) MCG/ACT AEPB Inhale 1-2 puffs into the lungs every 6 (six) hours as needed (for shortness of breath/wheezing).     Tiotropium Bromide-Olodaterol (STIOLTO RESPIMAT) 2.5-2.5 MCG/ACT AERS Inhale 2 Inhalers into the lungs daily. Qty: 1 Inhaler, Refills: 5   Associated Diagnoses: Pulmonary emphysema, unspecified emphysema type    Ceftin 500mg  BID x 10 days.   STOP taking these medications     loratadine (ALLERGY RELIEF) 10 MG tablet      azithromycin (ZITHROMAX) 250 MG tablet      EASY TOUCH PEN NEEDLES 31G X 8 MM MISC      ONE TOUCH ULTRA TEST test strip      ONETOUCH DELICA LANCETS 32I MISC      predniSONE (DELTASONE) 20 MG tablet      sulfamethoxazole-trimethoprim (BACTRIM DS,SEPTRA DS) 800-160 MG per tablet        Allergies  Allergen Reactions  . Ace Inhibitors     Hyperkalemia--07/23/2013:patient states not familiar with the following allergy      The results of significant diagnostics from this hospitalization (including imaging, microbiology, ancillary and laboratory) are listed below for reference.    Significant Diagnostic Studies: Ct Orbits W/cm  05/09/2015   CLINICAL DATA:  Left eye soreness x1 week, redness/swelling  EXAM: CT ORBITS WITH CONTRAST  TECHNIQUE: Multidetector CT imaging of the orbits was performed following the bolus administration of intravenous contrast.  CONTRAST:  50mL OMNIPAQUE IOHEXOL 300 MG/ML  SOLN  COMPARISON:  None.  FINDINGS: Preseptal soft tissue swelling overlying the left orbit (series 2/ image 20). Underlying globe and retroconal soft tissues are within normal limits.  Right orbit is within normal limits.  Partial opacification/ mucosal thickening of the bilateral maxillary sinuses. Visualized paranasal sinuses and mastoid air cells are otherwise clear.  No evidence of maxillofacial fracture.  IMPRESSION: Preseptal cellulitis overlying the left orbit.   Underlying globe and retroconal soft tissues are within normal limits.   Electronically Signed   By: Julian Hy M.D.   On: 05/09/2015 18:43    Microbiology: No results found for this or any previous visit (from the past 240 hour(s)).   Labs: Basic Metabolic Panel:  Recent Labs Lab 05/09/15 1608 05/10/15 0605  NA 131* 134*  K 4.7 4.3  CL 94* 98*  CO2 26 30  GLUCOSE 451* 248*  BUN 20 14  CREATININE 1.02 0.83  CALCIUM 9.0 8.7*   CBC:  Recent Labs Lab 05/09/15 1608 05/10/15 0605  WBC 9.1 8.0  NEUTROABS 6.0  --   HGB 13.2 13.1  HCT 41.2  41.7  MCV 79.7 79.9  PLT 332 333   Cardiac Enzymes: No results for input(s): CKTOTAL, CKMB, CKMBINDEX, TROPONINI in the last 168 hours. BNP: BNP (last 3 results)  Recent Labs  09/24/14 1350  BNP 142.0*    ProBNP (last 3 results)  Recent Labs  09/02/14 0835  PROBNP 27.1    CBG:  Recent Labs Lab 05/09/15 1838 05/09/15 1923 05/09/15 2116 05/10/15 0710 05/10/15 1134  GLUCAP 329* 326* 229* 253* 200*   Signed:  Hewitt Garner  [facility] 05/10/2015, 12:23 PM

## 2015-05-10 NOTE — Care Management Note (Signed)
Case Management Note  Patient Details  Name: SAJAD GLANDER MRN: [id number] Date of Birth: 29-Jul-1935  Expected Discharge Date:                  Expected Discharge Plan:  Home/Self Care  In-House Referral:  NA  Discharge planning Services  CM Consult  Post Acute Care Choice:  NA Choice offered to:  NA  DME Arranged:    DME Agency:     HH Arranged:    [place] Agency:     Status of Service:  Completed, signed off  Medicare Important Message Given:    Date Medicare IM Given:    Medicare IM give by:    Date Additional Medicare IM Given:    Additional Medicare Important Message give by:     If discussed at [place] of Stay Meetings, dates discussed:    Additional Comments: Pt is from home, lives with wife and is independent at baseline. Pt has home O2 and neb machine. Pt plans to discharge home today with self care. No CM needs.  Sherald Barge, RN 05/10/2015, 1:24 PM

## 2015-05-10 NOTE — Progress Notes (Signed)
Patient was discharged home today with instructions,given on medications,and follow up visits,patient verbalized understanding. Prescriptions sent to Pharmacy of choice documented on AVS. No c/o pain or discomfort noted. Accompanied by staff to an awaiting vehicle.

## 2015-05-12 DIAGNOSIS — J441 Chronic obstructive pulmonary disease with (acute) exacerbation: Secondary | ICD-10-CM | POA: Diagnosis not present

## 2015-05-12 DIAGNOSIS — R0600 Dyspnea, unspecified: Secondary | ICD-10-CM | POA: Diagnosis not present

## 2015-05-12 DIAGNOSIS — J8410 Pulmonary fibrosis, unspecified: Secondary | ICD-10-CM | POA: Diagnosis not present

## 2015-05-12 DIAGNOSIS — J40 Bronchitis, not specified as acute or chronic: Secondary | ICD-10-CM | POA: Diagnosis not present

## 2015-05-19 DIAGNOSIS — J40 Bronchitis, not specified as acute or chronic: Secondary | ICD-10-CM | POA: Diagnosis not present

## 2015-05-19 DIAGNOSIS — J441 Chronic obstructive pulmonary disease with (acute) exacerbation: Secondary | ICD-10-CM | POA: Diagnosis not present

## 2015-05-19 DIAGNOSIS — J8410 Pulmonary fibrosis, unspecified: Secondary | ICD-10-CM | POA: Diagnosis not present

## 2015-05-19 DIAGNOSIS — J449 Chronic obstructive pulmonary disease, unspecified: Secondary | ICD-10-CM | POA: Diagnosis not present

## 2015-05-19 DIAGNOSIS — R0600 Dyspnea, unspecified: Secondary | ICD-10-CM | POA: Diagnosis not present

## 2015-05-27 ENCOUNTER — Ambulatory Visit (INDEPENDENT_AMBULATORY_CARE_PROVIDER_SITE_OTHER): Payer: Medicare Other | Admitting: Physician Assistant

## 2015-05-27 ENCOUNTER — Encounter: Payer: Self-pay | Admitting: Physician Assistant

## 2015-05-27 VITALS — BP 148/98 | HR 88 | Temp 97.9°F | Resp 16 | Ht 65.0 in | Wt 154.0 lb

## 2015-05-27 DIAGNOSIS — J438 Other emphysema: Secondary | ICD-10-CM

## 2015-05-27 DIAGNOSIS — K635 Polyp of colon: Secondary | ICD-10-CM | POA: Diagnosis not present

## 2015-05-27 DIAGNOSIS — E785 Hyperlipidemia, unspecified: Secondary | ICD-10-CM

## 2015-05-27 DIAGNOSIS — I10 Essential (primary) hypertension: Secondary | ICD-10-CM

## 2015-05-27 DIAGNOSIS — J8410 Pulmonary fibrosis, unspecified: Secondary | ICD-10-CM

## 2015-05-27 DIAGNOSIS — R972 Elevated prostate specific antigen [PSA]: Secondary | ICD-10-CM

## 2015-05-27 DIAGNOSIS — E1165 Type 2 diabetes mellitus with hyperglycemia: Secondary | ICD-10-CM | POA: Diagnosis not present

## 2015-05-27 LAB — HEMOGLOBIN A1C, FINGERSTICK: Hgb A1C (fingerstick): >14 % — ABNORMAL HIGH (ref <5.7)

## 2015-05-27 NOTE — Progress Notes (Signed)
Patient ID: OAK DOREY MRN: [id number], DOB: September 01, 1935, 80 y.o. Date of Encounter: @DATE @  Chief Complaint:  Chief Complaint  Patient presents with  . 3 month F/U    is fasting    HPI: 80 y.o. year old male  presents for routine followup office visit.    At office visit with me 10/30/13 at which time we stopped multiple oral medicines and started Lantus. On 11/04/13 he had an ER visit with COPD. On 11/06/13 he had another ER visit with COPD. 11/10/2013 he had office visit with me to followup with COPD. 11/15/13 he was hospitalized with COPD. 12/01/13 have followup office visit with me regarding his COPD. 12/08/13 he had another followup office visit with me.  In the past I had placed him on Spiriva and Symbicort for his COPD. Repeatedly, he was noncompliant with these secondary to finances. At office visits I gave him samples repeatedly.  He is currently on budesonide Pulmicort nebulizer. Also currently on tiotropium bromide--Stiolto Respimat. Also nasal cannula oxygen. Also albuterol nebulizer.  Also in the past we had [facility] evaluate and visit him but then he was not compliant with following up with them and letting them help him.  We recently did contact them again and I do see that they have entered recent notes starting on 01/25/15 up through 02/09/15.  At visit 02/25/15 he states that he is "all choked up" says that he has phlegm in his chest and throat. In using his nebulizer a lot for 5 times a day and at night. Says that he has oxygen at home but he just uses that one or 2 times per day as needed and sometimes at night.  At visit 02/25/15 he did bring in blood sugar log sheet. He does have documented a fasting reading for every single morning. No other readings at any other times of day. For the month of May all of his readings are ranging 121-200. He only has 3 readings above 200. May 12 was 230. May 14 was 206. May 26-220 For the month of April fasting readings all range from  109 to 215. However most of these readings are around 130. For the month of March, fasting readings range mostly from 143 to around 170.   At [place] 05/27/2015-- He reports "I guess you saw I was in the hospital"---05/09/15--Preseptal Cellulitis.  -----I asked today if he had f/u with Ophthalmolgist and he says he did not---b/c that's when his wife fell. Says his wife has been in the hospital for 2 weeks. Says she is now in [place] at [employer]. Says "all that running back and forth with her has run me ragged"  I see notes in Epic from"[facility]"---I asked pt if any nurses/staff have come to his house--and he says "no" and acts like he has seen/heard nothing of this.-- I then reviewed further---[facility] Note 03/22/2015---documents that pt does not want further f/u from them--"does not see the need" He also brings in BS readings.  I reviewed my last set of labs---that result note said to start documenting 2 hour post prandial BS readings.  However, readings today are all fasting but are documented for each/every day.  For July: 130s, 160s, also 200, 253, 220, 268 For August: 140-150 mostly, but also 170, 211, 212, 181. One reading of 278---but only one this high.  He continues to stay off of his cigarettes. Says he hasn't smoked in over 2 years.  Reports that he is taking blood pressure  medication as directed with no headedness. Reports that he is taking his pravastatin with no myalgias or other adverse effects.       Past Medical History  Diagnosis Date  . Diabetes mellitus   . COPD (chronic obstructive pulmonary disease)   . Hypertension   . Allergy     Rhinitis  . Elevated lipids   . Pulmonary fibrosis   . Bronchitis   . Colon polyps   . Vitamin D deficiency   . PSA elevation   . Chronic respiratory failure   . Hypercholesterolemia   . On home O2     2L N/C      Home Meds: Outpatient Prescriptions Prior to Visit  Medication Sig Dispense Refill  . albuterol (PROVENTIL)  (2.5 MG/3ML) 0.083% nebulizer solution INHALE 1 VIAL VIA NEBULIZER EVERY 6 HOURS AS NEEDED FOR WHEEZING OR SHORTNESS OF BREATH 75 vial 5  . aspirin 81 MG EC tablet TAKE 1 TABLET BY MOUTH ONCE DAILY 30 tablet 5  . budesonide (PULMICORT) 0.5 MG/2ML nebulizer solution Take 2 mLs (0.5 mg total) by nebulization 2 (two) times daily. 120 mL 12  . cefadroxil (DURICEF) 500 MG capsule Take 1 capsule (500 mg total) by mouth 2 (two) times daily. 20 capsule 0  . Insulin Detemir (LEVEMIR FLEXTOUCH) 100 UNIT/ML Pen Inject 20 units at bedtime (Patient taking differently: Inject 20 Units into the skin at bedtime. Inject 20 units at bedtime) 15 mL 11  . losartan (COZAAR) 100 MG tablet TAKE 1 TABLET BY MOUTH ONCE DAILY 30 tablet 3  . metFORMIN (GLUCOPHAGE) 1000 MG tablet TAKE 1 TABLET BY MOUTH 2 TIMES A DAY 60 tablet 3  . Omega-3 Fatty Acids (FISH OIL) 1000 MG CAPS Take 1 capsule by mouth 2 (two) times daily.     . pravastatin (PRAVACHOL) 80 MG tablet TAKE 1 TABLET BY MOUTH ONCE DAILY AT BEDTIME 30 tablet 3  . PROAIR RESPICLICK 161 (90 BASE) MCG/ACT AEPB Inhale 1-2 puffs into the lungs every 6 (six) hours as needed (for shortness of breath/wheezing).     . Tiotropium Bromide-Olodaterol (STIOLTO RESPIMAT) 2.5-2.5 MCG/ACT AERS Inhale 2 Inhalers into the lungs daily. 1 Inhaler 5  . erythromycin ophthalmic ointment Place 1 application into the left eye at bedtime. Use as directed every 3 hours for 10 days (Patient not taking: Reported on 05/27/2015) 3.5 g 0   No facility-administered medications prior to visit.     Allergies:  Allergies  Allergen Reactions  . Ace Inhibitors     Hyperkalemia--07/23/2013:patient states not familiar with the following allergy    Social History   Social History  . Marital Status: Married    Spouse Name: N/A  . Number of Children: N/A  . Years of Education: N/A   Occupational History  . 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 Smoking status: Former  Smoker -- 1.50 packs/day for 60 years    Types: Cigarettes    Quit date: 12/31/2012  . Smokeless tobacco: Never Used  . Alcohol Use: No  . Drug Use: No  . Sexual Activity: Yes    Birth Control/ Protection: None   Other Topics Concern  . Not on file   Social History Narrative    Family History  Problem Relation Age of Onset  . CAD Other   . Diabetes Other   . Heart disease Mother      Review of Systems:  See HPI for pertinent ROS. All other  ROS negative.    Physical Exam: Blood pressure 148/98, pulse 88, temperature 97.9 F (36.6 C), temperature source Oral, resp. rate 16, height 5\' 5"  (1.651 m), weight 154 lb (69.854 kg)., Body mass index is 25.63 kg/(m^2).  General: WNWD WM, "Weathered Appearance" Appears in no acute distress. Neck: Supple. No thyromegaly. No lymphadenopathy. No carotid bruits. Lungs: Very slight wheeze heard at upper lungs. Remainder clear.  Heart: RRR with S1 S2. No murmurs, rubs, or gallops. Abdomen: Soft, non-tender, non-distended with normoactive bowel sounds. No hepatomegaly. No rebound/guarding. No obvious abdominal masses. Musculoskeletal:  Strength and tone normal for age. Extremities/Skin: Warm and dry. No edema.  Neuro: Alert and oriented X 3. Moves all extremities spontaneously. Gait is normal. CNII-XII grossly in tact. Psych:  Responds to questions appropriately with a normal affect. Diabetic foot exam: Inspection is normal. No wounds or concerning lesions. Sensation is intact. On the right: 2+ dorsalis pedis 2+ PT. On the left 1+ DP. 2+ PT.      ASSESSMENT AND PLAN:  80 y.o. year old male with   1. History of repeated noncompliance in the past secondary to finances.  In the past he frequently could not afford all of his oral diabetic medications. In the past has also reported that he is unable to afford his Spiriva and Symbicort. We have been providing him with samples of both Spiriva and Symbicort. As well, we have been providing him  with samples of Lantus or Levemir as often as possible. At his visit with me 03/12/14 I had [name] to contact [employer] to see whether they can help provide some assistance to him. [facility] follow-up with him but he did not want their assistance --01/2015--[facility] has reached out to pt again.  Is now on Neb meds At [place] 05/27/2015----I asked pt about "[facility]" and he says "no"--nobody coming to his house, nobody assisting him with his treatment-- I then reviewed further---[facility] Note 03/22/2015---documents that pt does not want further f/u from them--"does not see the need"  2. history of smoking. He quit smoking January 2014. Congratulations and staying off of the cigarettes!!  3. COPD Status post ER visit for COPD on 11/04/13 Another ER visit for COPD on 11/06/13 Status post hospitalization for COPD exacerbation 11/15/13 See HPI regarding Spiriva and Symbicort.  He is on now on Pulmicort nebulizer (budesonide) He is now on Stiolto Respimat (tiotropium bromide) He has oxygen nasal cannula at home but is not using it regularly.  3. Diabetes  At visit 11/25/14 A1c came back at >14----- however at that visit he had brought with him blood sugar log with readings that looked good. That made no sense.  I am wondering whether he is simply not taking the medication at all and just writing down numbers that will make me happy. Follow-up today's A1c and go from there. The history of present illness regarding blood sugar log readings that he brought to today's visit.  At visit 02/25/15---A1C very high again--That Result Note--said for him to start documenting 2 hour postprandial sugars, but he did not  - Hemoglobin A1c  Microalbumin 05/12/14 I have discussed need for routine eye exams and dental exams but he hasn't been able to followup with the secondary to finances. On ARB. (H/O hyperkalemia with ACE Inh) On statin. On ASA 81mg .   2. Hypertension At Lab 07/23/13 his potassium was even higher. It  had been borderline high prior to this. At that time his ACE inhibitor was stopped and he was  started on ARB. BP hih at [place] 05/27/2015----will monitor prior to adjusting meds---pt says "it is b/c he has been running ragged b/c of his wife being in the hospital"  3. Elevated lipids FLP excellent 02/25/2015 with LDL 68. LFTs were normal.  5. Pulmonary fibrosis  6. Elevated PSA He had elevated PSA at Lab 07/2012. Also prior to this PSA was elevated. He has been informed this could be secondary to prostate cancer but refused followup with urology. He still refuses to recheck PSA. He is aware that he could have prostate cancer but does not want further evaluation or followup with his AT ALL.  7. Colon polyps History of Hemoccult-positive stool x3 in 07/2008. Refused GI evaluation despite being informed of risk of severe GI bleed, cancer, et Ronney Asters. H./H. normal 03/2010 and 01/2011. Refuses colonoscopy.  8. Immunizations: We have discussed influenza vaccine last flu season and he is deferred. Pneumonia vaccine: He received Pneumovax June 2003.  Received  Prevnar 13  10/23/2013 .  Tetanus: Gave in September 2005. Not covered by Medicare --therefore has deferred f/u sec to cost.  Zostavax: He has had so much problem with compliance, I have not even discussed this with pt!!   Routine office visit 3 months or sooner if needed.   Marin Olp [place], [place], [facility] 05/27/2015 8:43 AM

## 2015-05-28 LAB — MICROALBUMIN, URINE: Microalb, Ur: 18.6 mg/dL — ABNORMAL HIGH (ref <2.0)

## 2015-06-12 DIAGNOSIS — J8410 Pulmonary fibrosis, unspecified: Secondary | ICD-10-CM | POA: Diagnosis not present

## 2015-06-12 DIAGNOSIS — J40 Bronchitis, not specified as acute or chronic: Secondary | ICD-10-CM | POA: Diagnosis not present

## 2015-06-12 DIAGNOSIS — J441 Chronic obstructive pulmonary disease with (acute) exacerbation: Secondary | ICD-10-CM | POA: Diagnosis not present

## 2015-06-12 DIAGNOSIS — R0600 Dyspnea, unspecified: Secondary | ICD-10-CM | POA: Diagnosis not present

## 2015-06-14 ENCOUNTER — Ambulatory Visit (INDEPENDENT_AMBULATORY_CARE_PROVIDER_SITE_OTHER): Payer: Medicare Other | Admitting: Physician Assistant

## 2015-06-14 ENCOUNTER — Encounter: Payer: Self-pay | Admitting: Physician Assistant

## 2015-06-14 VITALS — BP 124/70 | HR 80 | Temp 98.3°F | Resp 18 | Wt 157.0 lb

## 2015-06-14 DIAGNOSIS — H00036 Abscess of eyelid left eye, unspecified eyelid: Secondary | ICD-10-CM

## 2015-06-14 MED ORDER — ERYTHROMYCIN 5 MG/GM OP OINT: 1.0000 "application " | TOPICAL_OINTMENT | Freq: Four times a day (QID) | OPHTHALMIC | Status: DC

## 2015-06-14 MED ORDER — CEFADROXIL 500 MG PO CAPS: 500.0000 mg | ORAL_CAPSULE | Freq: Two times a day (BID) | ORAL | Status: DC

## 2015-06-14 NOTE — Progress Notes (Signed)
Patient ID: Edward [name] MRN: [id number], DOB: [birth year], 80 y.o. Date of Encounter: @DATE @  Chief Complaint:  Chief Complaint  Patient presents with  . rt eye infection x 1 week    HPI: 80 y.o. year old male  presents with above.   He states that he first noticed any problem going on with this sometime last week. Says at first his right eye looked a little pink and felt a little sore. It has gradually worsened until it looks as it does today. Has had no trauma to the eye. Says that the eye is not painful. Can move the eyes and has no pain. No recent sores in the area. Has had no fevers. Says that he does not feel any type of discomfort up inside of his nose or around the area of the nasal bridge. No headache.  Reviewed that he had hospitalization 05/09/15-05/10/15. At that time he presented with over 1 week of worsening left upper eyelid swelling and redness. He was admitted to the hospital and started on IV clindamycin. Their note stated that they were not convinced that he had periorbital cellulitis. Noted that he did have some redness which could be inflammatory in nature. Had CT scan of the orbits that showed impression of preseptal cellulitis overlying the left orbit. Underlying globe and retroconal soft tissues are within normal limits. Labs showed normal WBC. They had scheduled follow-up appointment with Dr. Venetia Maxon ophthalmologist. Was discharged home on Duricef 500 mg twice a day for 10 days along with erythromycin ointment.  I verified with him that the previous episode 05/09/15 was in fact involving the left eye and that present episode is involving the right eye.   Patient did take the medications as directed. However he never had follow-up with ophthalmologist. He reports that his wife had to the hospital and has been at a rehabilitation facility. This was part of the reason he missed that appointment. Also today when I discussed having him see an ophthalmologist at this time-- he  says that he can not afford it.  He states that after taking the medications prescribed 05/10/15 that symptoms had completely resolved and he had absolutely no problem or signs or symptoms of issues around his eyes or nose until last week.   Past Medical History  Diagnosis Date  . Diabetes mellitus   . COPD (chronic obstructive pulmonary disease)   . Hypertension   . Allergy     Rhinitis  . Elevated lipids   . Pulmonary fibrosis   . Bronchitis   . Colon polyps   . Vitamin D deficiency   . PSA elevation   . Chronic respiratory failure   . Hypercholesterolemia   . On home O2     2L N/C      Home Meds: Outpatient Prescriptions Prior to Visit  Medication Sig Dispense Refill  . albuterol (PROVENTIL) (2.5 MG/3ML) 0.083% nebulizer solution INHALE 1 VIAL VIA NEBULIZER EVERY 6 HOURS AS NEEDED FOR WHEEZING OR SHORTNESS OF BREATH 75 vial 5  . aspirin 81 MG EC tablet TAKE 1 TABLET BY MOUTH ONCE DAILY 30 tablet 5  . budesonide (PULMICORT) 0.5 MG/2ML nebulizer solution Take 2 mLs (0.5 mg total) by nebulization 2 (two) times daily. 120 mL 12  . Insulin Detemir (LEVEMIR FLEXTOUCH) 100 UNIT/ML Pen Inject 20 units at bedtime (Patient taking differently: Inject 20 Units into the skin at bedtime. Inject 20 units at bedtime) 15 mL 11  . losartan (COZAAR) 100 MG tablet TAKE  1 TABLET BY MOUTH ONCE DAILY 30 tablet 3  . metFORMIN (GLUCOPHAGE) 1000 MG tablet TAKE 1 TABLET BY MOUTH 2 TIMES A DAY 60 tablet 3  . Omega-3 Fatty Acids (FISH OIL) 1000 MG CAPS Take 1 capsule by mouth 2 (two) times daily.     . pravastatin (PRAVACHOL) 80 MG tablet TAKE 1 TABLET BY MOUTH ONCE DAILY AT BEDTIME 30 tablet 3  . PROAIR RESPICLICK 765 (90 BASE) MCG/ACT AEPB Inhale 1-2 puffs into the lungs every 6 (six) hours as needed (for shortness of breath/wheezing).     . Tiotropium Bromide-Olodaterol (STIOLTO RESPIMAT) 2.5-2.5 MCG/ACT AERS Inhale 2 Inhalers into the lungs daily. 1 Inhaler 5  . cefadroxil (DURICEF) 500 MG capsule Take  1 capsule (500 mg total) by mouth 2 (two) times daily. 20 capsule 0   No facility-administered medications prior to visit.    Allergies:  Allergies  Allergen Reactions  . Ace Inhibitors     Hyperkalemia--07/23/2013:patient states not familiar with the following allergy    Social History   Social History  . Marital Status: Married    Spouse Name: N/A  . Number of Children: N/A  . Years of Education: N/A   Occupational History  . 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 Smoking status: Former Smoker -- 1.50 packs/day for 60 years    Types: Cigarettes    Quit date: 12/31/2012  . Smokeless tobacco: Never Used  . Alcohol Use: No  . Drug Use: No  . Sexual Activity: Yes    Birth Control/ Protection: None   Other Topics Concern  . Not on file   Social History Narrative    Family History  Problem Relation Age of Onset  . CAD Other   . Diabetes Other   . Heart disease Mother      Review of Systems:  See HPI for pertinent ROS. All other ROS negative.    Physical Exam: Blood pressure 124/70, pulse 80, temperature 98.3 F (36.8 C), temperature source Oral, resp. rate 18, weight 157 lb (71.215 kg)., Body mass index is 26.13 kg/(m^2). General: WM. Appears in no acute distress. Head: Normocephalic, atraumatic. Left Eye: Normal Right Eye:  Minimal swelling of upper eye lid.  Lower portion of conjunctiva--with inflamed appearing area in inferior-lateral portion.  Area inferior to the right eye: there is puffy swelling with purple ecchymosis.    Bilateral auditory canals clear, TM's are without perforation, pearly grey and translucent with reflective cone of light bilaterally. Oral cavity moist, posterior pharynx without exudate, erythema, peritonsillar abscess.  Neck: Supple. No thyromegaly. No lymphadenopathy. Lungs: Distant, decreased breath sounds bilaterally throughout. Heart: RRR with S1 S2. No murmurs, rubs, or gallops. Musculoskeletal:   Strength and tone normal for age. Extremities/Skin: Warm and dry. Neuro: Alert and oriented X 3. Moves all extremities spontaneously. Gait is normal. CNII-XII grossly in tact. Psych:  Responds to questions appropriately with a normal affect.     ASSESSMENT AND PLAN:  80 y.o. year old male with  1. Preseptal cellulitis, left Initially I explained to patient that he really needs to go to the ER. He refuses to go to the hospital. Also discussed scheduling him to see ophthalmologist. States that he cannot afford to pay the co-pay and refuses. I discussed with him risk of infection around the eye spreading to the brain. Discussed seriousness of such risk. He still refuses to go to hospital or ophthalmology. Told him that I will treat  him with same medications that he used 05/11/15 with resolution of the symptoms at that time.  However if symptoms worsen at all or do not resolve at completion of antibiotic,  to go to the emergency room immediately. He voices understanding and agrees to do so. - cefadroxil (DURICEF) 500 MG capsule; Take 1 capsule (500 mg total) by mouth 2 (two) times daily.  Dispense: 20 capsule; Refill: 0 - erythromycin (ROMYCIN) ophthalmic ointment; Place 1 application into the right eye 4 (four) times daily.  Dispense: 3.5 g; Refill: 0   Signed, [address] [place], [place], [facility] 06/14/2015 2:01 PM

## 2015-06-18 ENCOUNTER — Telehealth: Payer: Self-pay | Admitting: Family Medicine

## 2015-06-18 ENCOUNTER — Other Ambulatory Visit: Payer: Self-pay | Admitting: Physician Assistant

## 2015-06-18 MED ORDER — INSULIN DETEMIR 100 UNIT/ML FLEXPEN: 25.0000 [IU] | PEN_INJECTOR | Freq: Every day | SUBCUTANEOUS | Status: DC

## 2015-06-18 NOTE — Telephone Encounter (Signed)
Medication refilled per protocol. 

## 2015-06-18 NOTE — Telephone Encounter (Signed)
Finally able to reach pt about recent lab results.  Aware of need to adjust insulin.  Making sure meds are correct.  Told to check FBS next three mornings and call us Monday with readings.

## 2015-06-18 NOTE — Telephone Encounter (Signed)
-----   Message from Orlena Sheldon, PA-C sent at 05/28/2015 12:35 PM EDT ----- Med List has current Diabetes Meds are: Metformin 1000mg  BID and Levemir 20 units QHS. Verify with pt that he is doing these meds as directed. IF SO, THEN: Increase the Levemir to 25 units QHS.  Document Fasting BS every morning.  Call us on Monday with the fasting BS readings for Saturday, Sunday, Monday.

## 2015-06-19 DIAGNOSIS — J449 Chronic obstructive pulmonary disease, unspecified: Secondary | ICD-10-CM | POA: Diagnosis not present

## 2015-06-21 ENCOUNTER — Other Ambulatory Visit: Payer: Self-pay | Admitting: Physician Assistant

## 2015-06-22 NOTE — Telephone Encounter (Signed)
Medication refilled per protocol. 

## 2015-07-05 DIAGNOSIS — R0600 Dyspnea, unspecified: Secondary | ICD-10-CM | POA: Diagnosis not present

## 2015-07-05 DIAGNOSIS — J8410 Pulmonary fibrosis, unspecified: Secondary | ICD-10-CM | POA: Diagnosis not present

## 2015-07-05 DIAGNOSIS — J441 Chronic obstructive pulmonary disease with (acute) exacerbation: Secondary | ICD-10-CM | POA: Diagnosis not present

## 2015-07-05 DIAGNOSIS — J40 Bronchitis, not specified as acute or chronic: Secondary | ICD-10-CM | POA: Diagnosis not present

## 2015-07-12 DIAGNOSIS — J441 Chronic obstructive pulmonary disease with (acute) exacerbation: Secondary | ICD-10-CM | POA: Diagnosis not present

## 2015-07-12 DIAGNOSIS — J8410 Pulmonary fibrosis, unspecified: Secondary | ICD-10-CM | POA: Diagnosis not present

## 2015-07-12 DIAGNOSIS — J40 Bronchitis, not specified as acute or chronic: Secondary | ICD-10-CM | POA: Diagnosis not present

## 2015-07-12 DIAGNOSIS — R0600 Dyspnea, unspecified: Secondary | ICD-10-CM | POA: Diagnosis not present

## 2015-07-19 DIAGNOSIS — J449 Chronic obstructive pulmonary disease, unspecified: Secondary | ICD-10-CM | POA: Diagnosis not present

## 2015-08-06 DIAGNOSIS — J40 Bronchitis, not specified as acute or chronic: Secondary | ICD-10-CM | POA: Diagnosis not present

## 2015-08-06 DIAGNOSIS — J441 Chronic obstructive pulmonary disease with (acute) exacerbation: Secondary | ICD-10-CM | POA: Diagnosis not present

## 2015-08-06 DIAGNOSIS — R0600 Dyspnea, unspecified: Secondary | ICD-10-CM | POA: Diagnosis not present

## 2015-08-06 DIAGNOSIS — J8410 Pulmonary fibrosis, unspecified: Secondary | ICD-10-CM | POA: Diagnosis not present

## 2015-08-12 DIAGNOSIS — J8410 Pulmonary fibrosis, unspecified: Secondary | ICD-10-CM | POA: Diagnosis not present

## 2015-08-12 DIAGNOSIS — J40 Bronchitis, not specified as acute or chronic: Secondary | ICD-10-CM | POA: Diagnosis not present

## 2015-08-12 DIAGNOSIS — R0600 Dyspnea, unspecified: Secondary | ICD-10-CM | POA: Diagnosis not present

## 2015-08-12 DIAGNOSIS — J441 Chronic obstructive pulmonary disease with (acute) exacerbation: Secondary | ICD-10-CM | POA: Diagnosis not present

## 2015-08-19 ENCOUNTER — Other Ambulatory Visit: Payer: Self-pay | Admitting: Physician Assistant

## 2015-08-19 DIAGNOSIS — J449 Chronic obstructive pulmonary disease, unspecified: Secondary | ICD-10-CM | POA: Diagnosis not present

## 2015-08-19 NOTE — Telephone Encounter (Signed)
Refill appropriate and filled per protocol. 

## 2015-08-30 ENCOUNTER — Encounter: Payer: Self-pay | Admitting: Physician Assistant

## 2015-08-30 ENCOUNTER — Ambulatory Visit (INDEPENDENT_AMBULATORY_CARE_PROVIDER_SITE_OTHER): Payer: Medicare Other | Admitting: Physician Assistant

## 2015-08-30 VITALS — BP 128/70 | HR 84 | Temp 98.3°F | Resp 20 | Wt 158.0 lb

## 2015-08-30 DIAGNOSIS — K635 Polyp of colon: Secondary | ICD-10-CM

## 2015-08-30 DIAGNOSIS — J8410 Pulmonary fibrosis, unspecified: Secondary | ICD-10-CM | POA: Diagnosis not present

## 2015-08-30 DIAGNOSIS — R972 Elevated prostate specific antigen [PSA]: Secondary | ICD-10-CM

## 2015-08-30 DIAGNOSIS — E785 Hyperlipidemia, unspecified: Secondary | ICD-10-CM | POA: Diagnosis not present

## 2015-08-30 DIAGNOSIS — Z794 Long term (current) use of insulin: Secondary | ICD-10-CM | POA: Diagnosis not present

## 2015-08-30 DIAGNOSIS — E1165 Type 2 diabetes mellitus with hyperglycemia: Secondary | ICD-10-CM | POA: Diagnosis not present

## 2015-08-30 DIAGNOSIS — J438 Other emphysema: Secondary | ICD-10-CM

## 2015-08-30 DIAGNOSIS — I10 Essential (primary) hypertension: Secondary | ICD-10-CM | POA: Diagnosis not present

## 2015-08-30 LAB — COMPLETE METABOLIC PANEL WITH GFR
ALK PHOS: 93 U/L (ref 40–115)
ALT: 11 U/L (ref 9–46)
AST: 13 U/L (ref 10–35)
Albumin: 4 g/dL (ref 3.6–5.1)
BUN: 15 mg/dL (ref 7–25)
CALCIUM: 9.3 mg/dL (ref 8.6–10.3)
CHLORIDE: 95 mmol/L — AB (ref 98–110)
CO2: 28 mmol/L (ref 20–31)
Creat: 0.95 mg/dL (ref 0.70–1.18)
GFR, EST AFRICAN AMERICAN: 88 mL/min (ref >=60)
GFR, EST NON AFRICAN AMERICAN: 76 mL/min (ref >=60)
Glucose, Bld: 243 mg/dL — ABNORMAL HIGH (ref 70–99)
POTASSIUM: 4.8 mmol/L (ref 3.5–5.3)
Sodium: 133 mmol/L — ABNORMAL LOW (ref 135–146)
Total Bilirubin: 0.4 mg/dL (ref 0.2–1.2)
Total Protein: 6.4 g/dL (ref 6.1–8.1)

## 2015-08-30 LAB — HEMOGLOBIN A1C
HEMOGLOBIN A1C: 14 % — AB (ref <5.7)
MEAN PLASMA GLUCOSE: 355 mg/dL — AB (ref <117)

## 2015-08-30 LAB — LIPID PANEL
CHOLESTEROL: 153 mg/dL (ref 125–200)
HDL: 45 mg/dL (ref >=40)
LDL Cholesterol: 70 mg/dL (ref <130)
TRIGLYCERIDES: 189 mg/dL — AB (ref <150)
Total CHOL/HDL Ratio: 3.4 Ratio (ref <=5.0)
VLDL: 38 mg/dL — ABNORMAL HIGH (ref <30)

## 2015-08-30 MED ORDER — PREDNISONE 20 MG PO TABS: 20.0000 mg | ORAL_TABLET | Freq: Every day | ORAL | Status: DC

## 2015-08-30 NOTE — Progress Notes (Signed)
Patient ID: RIGOBERTO NANDIN MRN: [id number], DOB: [birth year], 80 y.o. Date of Encounter: @DATE @  Chief Complaint:  Chief Complaint  Patient presents with  . 3 mth check up    is fasting    HPI: 80 y.o. year old male  presents for routine followup office visit.    At office visit with me 10/30/13 at which time we stopped multiple oral medicines and started Lantus. On 11/04/13 he had an ER visit with COPD. On 11/06/13 he had another ER visit with COPD. 11/10/2013 he had office visit with me to followup with COPD. 11/15/13 he was hospitalized with COPD. 12/01/13 have followup office visit with me regarding his COPD. 12/08/13 he had another followup office visit with me.  In the past I had placed him on Spiriva and Symbicort for his COPD. Repeatedly, he was noncompliant with these secondary to finances. At office visits I gave him samples repeatedly.  He is currently on budesonide Pulmicort nebulizer. Also currently on tiotropium bromide--Stiolto Respimat. Also nasal cannula oxygen. Also albuterol nebulizer.  Also in the past we had [facility] evaluate and visit him but then he was not compliant with following up with them and letting them help him.  We recently did contact them again and I do see that they have entered recent notes starting on 01/25/15 up through 02/09/15.  At visit 02/25/15 he states that he is "all choked up" says that he has phlegm in his chest and throat. In using his nebulizer a lot for 5 times a day and at night. Says that he has oxygen at home but he just uses that one or 2 times per day as needed and sometimes at night.  At visit 02/25/15 he did bring in blood sugar log sheet. He does have documented a fasting reading for every single morning. No other readings at any other times of day. For the month of May all of his readings are ranging 121-200. He only has 3 readings above 200. May 12 was 230. May 14 was 206. May 26-220 For the month of April fasting readings all range  from 109 to 215. However most of these readings are around 130. For the month of March, fasting readings range mostly from 143 to around 170.   At [place] 05/27/2015-- He reports "I guess you saw I was in the hospital"---05/09/15--Preseptal Cellulitis.  -----I asked today if he had f/u with Ophthalmolgist and he says he did not---b/c that's when his wife fell. Says his wife has been in the hospital for 2 weeks. Says she is now in [place] at [employer]. Says "all that running back and forth with her has run me ragged"  I see notes in Epic from"[facility]"---I asked pt if any nurses/staff have come to his house--and he says "no" and acts like he has seen/heard nothing of this.-- I then reviewed further---[facility] Note 03/22/2015---documents that pt does not want further f/u from them--"does not see the need" He also brings in BS readings.  I reviewed my last set of labs---that result note said to start documenting 2 hour post prandial BS readings.  However, readings today are all fasting but are documented for each/every day.  For July: 130s, 160s, also 200, 253, 220, 268 For August: 140-150 mostly, but also 170, 211, 212, 181. One reading of 278---but only one this high.  At [place] 08/30/2015: Reviewed that last A1C 05/27/15 was >14.  He says that at that time he did increase Levemir from 20  units QHS to 25 units QHS.  Brings BS log: Only has Fasting readings: FOr every morning : Some 131, 121, 134, 111,  Most are 150-160 range. Highest: 200 and 284 but these are the only 2 high readings like this.   Says he has cough at night that keeps him awake--says some nights not so bad, but some nights, cough when lay down. Uses Albuterol multiple times in the night on those nights.  Says he is using all Pulm meds as directed and that they have not run out.   He continues to stay off of his cigarettes. Says he hasn't smoked in over 2 years.  Reports that he is taking blood pressure medication as directed with  no headedness. Reports that he is taking his pravastatin with no myalgias or other adverse effects.       Past Medical History  Diagnosis Date  . Diabetes mellitus   . COPD (chronic obstructive pulmonary disease) ([place])   . Hypertension   . Allergy     Rhinitis  . Elevated lipids   . Pulmonary fibrosis ([place])   . Bronchitis   . Colon polyps   . Vitamin D deficiency   . PSA elevation   . Chronic respiratory failure ([place])   . Hypercholesterolemia   . On home O2     2L N/C      Home Meds: Outpatient Prescriptions Prior to Visit  Medication Sig Dispense Refill  . albuterol (PROVENTIL) (2.5 MG/3ML) 0.083% nebulizer solution INHALE 1 VIAL VIA NEBULIZER EVERY 6 HOURS AS NEEDED FOR WHEEZING OR SHORTNESS OF BREATH 360 mL 5  . aspirin 81 MG EC tablet TAKE 1 TABLET BY MOUTH ONCE DAILY 30 tablet 3  . budesonide (PULMICORT) 0.5 MG/2ML nebulizer solution Take 2 mLs (0.5 mg total) by nebulization 2 (two) times daily. 120 mL 12  . EASY TOUCH PEN NEEDLES 31G X 8 MM MISC USE 1 PEN NEEDLE ONCE DAILY AS DIRECTED 100 each 3  . Insulin Detemir (LEVEMIR FLEXTOUCH) 100 UNIT/ML Pen Inject 25 Units into the skin daily at 10 pm. 25 units at bedtime 15 mL 11  . losartan (COZAAR) 100 MG tablet TAKE 1 TABLET BY MOUTH ONCE DAILY 30 tablet 3  . metFORMIN (GLUCOPHAGE) 1000 MG tablet TAKE 1 TABLET BY MOUTH 2 TIMES A DAY 60 tablet 3  . Omega-3 Fatty Acids (FISH OIL) 1000 MG CAPS Take 1 capsule by mouth 2 (two) times daily.     . ONE TOUCH ULTRA TEST test strip CHECK FASTING BLOOD SUGARS EVERY MORNING  11  . ONETOUCH DELICA LANCETS 99991111 MISC USE TO CHECK BLOOD SUGAR TWICE DAILY AS DIRECTED 100 each 3  . pravastatin (PRAVACHOL) 80 MG tablet TAKE 1 TABLET BY MOUTH ONCE DAILY AT BEDTIME 30 tablet 3  . PROAIR RESPICLICK 123XX123 (90 BASE) MCG/ACT AEPB INHALE 2 PUFFS INTO LUNGS EVERY 6 HOURS AS NEEDED FOR WHEEZING OR SHORTNESS OF BREATH 1 each 5  . Tiotropium Bromide-Olodaterol (STIOLTO RESPIMAT) 2.5-2.5 MCG/ACT AERS  Inhale 2 Inhalers into the lungs daily. 1 Inhaler 5  . cefadroxil (DURICEF) 500 MG capsule Take 1 capsule (500 mg total) by mouth 2 (two) times daily. 20 capsule 0  . erythromycin (ROMYCIN) ophthalmic ointment Place 1 application into the right eye 4 (four) times daily. 3.5 g 0   No facility-administered medications prior to visit.     Allergies:  Allergies  Allergen Reactions  . Ace Inhibitors     Hyperkalemia--07/23/2013:patient states not familiar with the following allergy  Social History   Social History  . Marital Status: Married    Spouse Name: N/A  . Number of Children: N/A  . Years of Education: N/A   Occupational History  . 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 Smoking status: Former Smoker -- 1.50 packs/day for 60 years    Types: Cigarettes    Quit date: 12/31/2012  . Smokeless tobacco: Never Used  . Alcohol Use: No  . Drug Use: No  . Sexual Activity: Yes    Birth Control/ Protection: None   Other Topics Concern  . Not on file   Social History Narrative    Family History  Problem Relation Age of Onset  . CAD Other   . Diabetes Other   . Heart disease Mother      Review of Systems:  See HPI for pertinent ROS. All other ROS negative.    Physical Exam: Blood pressure 128/70, pulse 84, temperature 98.3 F (36.8 C), temperature source Oral, resp. rate 20, weight 158 lb (71.668 kg)., Body mass index is 26.29 kg/(m^2).  General: WNWD WM, "Weathered Appearance" Appears in no acute distress. Neck: Supple. No thyromegaly. No lymphadenopathy. No carotid bruits. Lungs: Very slight wheeze heard at right lower lung. Remainder clear. But distant BS, decreased air movement throughout.  Heart: RRR with S1 S2. No murmurs, rubs, or gallops. Abdomen: Soft, non-tender, non-distended with normoactive bowel sounds. No hepatomegaly. No rebound/guarding. No obvious abdominal masses. Musculoskeletal:  Strength and tone normal for  age. Extremities/Skin: Warm and dry. No edema.  Neuro: Alert and oriented X 3. Moves all extremities spontaneously. Gait is normal. CNII-XII grossly in tact. Psych:  Responds to questions appropriately with a normal affect. Diabetic foot exam: Inspection is normal. No wounds or concerning lesions. Sensation is intact. On the right: 2+ dorsalis pedis 2+ PT. On the left 1+ DP. 2+ PT.      ASSESSMENT AND PLAN:  80 y.o. year old male with   1. History of repeated noncompliance in the past secondary to finances.  In the past he frequently could not afford all of his oral diabetic medications. In the past has also reported that he is unable to afford his Spiriva and Symbicort. We have been providing him with samples of both Spiriva and Symbicort. As well, we have been providing him with samples of Lantus or Levemir as often as possible. At his visit with me 03/12/14 I had [name] to contact [employer] [employer] to see whether they can help provide some assistance to him. [facility] follow-up with him but he did not want their assistance --01/2015--[facility] has reached out to pt again.  Is now on Neb meds At [place] 05/27/2015----I asked pt about [facility]" and he says "no"--nobody coming to his house, nobody assisting him with his treatment-- I then reviewed further---[facility] Note 03/22/2015---documents that pt does not want further f/u from them--"does not see the need"  2. history of smoking. He quit smoking January 2014. Congratulations and staying off of the cigarettes!!  3. COPD At [facility] 08/30/15--will add low dose prednisone---do not want BS to increase furhter but do not want him to develop COPD exacerbation Status post ER visit for COPD on 11/04/13 Another ER visit for COPD on 11/06/13 Status post hospitalization for COPD exacerbation 11/15/13 See HPI regarding Spiriva and Symbicort.  He is on now on Pulmicort nebulizer (budesonide) He is now on Stiolto Respimat (tiotropium bromide) He has oxygen nasal cannula  at  home but is not using it regularly.  3. Diabetes  At visit 11/25/14 A1c came back at >14----- however at that visit he had brought with him blood sugar log with readings that looked good. That made no sense.  I am wondering whether he is simply not taking the medication at all and just writing down numbers that will make me happy. Follow-up today's A1c and go from there. The history of present illness regarding blood sugar log readings that he brought to today's visit.  At visit 02/25/15---A1C very high again--That Result Note--said for him to start documenting 2 hour postprandial sugars, but he did not  At [facility] 05/27/15---A1C again very high at >14.  At f/u [facility] 08/30/15--he says he did increase Levemir after that lab--from 20 to 25 units QHS - Hemoglobin A1c  Microalbumin 05/27/2015 I have discussed need for routine eye exams and dental exams but he hasn't been able to followup with the secondary to finances. On ARB. (H/O hyperkalemia with ACE Inh) On statin. On ASA 81mg .   2. Hypertension At [facility] 07/23/13 his potassium was even higher. It had been borderline high prior to this. At that time his ACE inhibitor was stopped and he was started on ARB. BP hgih at [place] 05/27/2015----will monitor prior to adjusting meds---pt says "it is b/c he has been running ragged b/c of his wife being in the hospital" BP good at [facility] 08/30/15  3. Elevated lipids FLP excellent 02/25/2015 with LDL 68. LFTs were normal.  5. Pulmonary fibrosis Managed by Pulmonary  6. Elevated PSA He had elevated PSA at [facility] 07/2012. Also prior to this PSA was elevated. He has been informed this could be secondary to prostate cancer but refused followup with urology. He still refuses to recheck PSA. He is aware that he could have prostate cancer but does not want further evaluation or followup with his AT ALL.  7. Colon polyps History of Hemoccult-positive stool x3 in 07/2008. Refused GI evaluation despite being informed of risk of  severe GI bleed, cancer, et Ronney Asters. H./H. normal 03/2010 and 01/2011. Refuses colonoscopy.  8. Immunizations: We have discussed influenza vaccine last flu season and he is deferred. Pneumonia vaccine: He received Pneumovax June 2003.  Received  Prevnar 13  10/23/2013 .  Tetanus: Gave in September 2005. Not covered by Medicare --therefore has deferred f/u sec to cost.  Zostavax: He has had so much problem with compliance, I have not even discussed this with pt!!   Routine office visit 3 months or sooner if needed.   [address] [place], [place], [facility] 08/30/2015 9:43 AM

## 2015-09-11 DIAGNOSIS — J40 Bronchitis, not specified as acute or chronic: Secondary | ICD-10-CM | POA: Diagnosis not present

## 2015-09-11 DIAGNOSIS — J8410 Pulmonary fibrosis, unspecified: Secondary | ICD-10-CM | POA: Diagnosis not present

## 2015-09-11 DIAGNOSIS — J441 Chronic obstructive pulmonary disease with (acute) exacerbation: Secondary | ICD-10-CM | POA: Diagnosis not present

## 2015-09-11 DIAGNOSIS — R0600 Dyspnea, unspecified: Secondary | ICD-10-CM | POA: Diagnosis not present

## 2015-09-15 ENCOUNTER — Encounter: Payer: Self-pay | Admitting: Physician Assistant

## 2015-09-15 ENCOUNTER — Ambulatory Visit (INDEPENDENT_AMBULATORY_CARE_PROVIDER_SITE_OTHER): Payer: Medicare Other | Admitting: Physician Assistant

## 2015-09-15 VITALS — BP 122/74 | HR 84 | Temp 98.5°F | Resp 18 | Wt 160.0 lb

## 2015-09-15 DIAGNOSIS — E1165 Type 2 diabetes mellitus with hyperglycemia: Secondary | ICD-10-CM

## 2015-09-15 DIAGNOSIS — Z794 Long term (current) use of insulin: Secondary | ICD-10-CM | POA: Diagnosis not present

## 2015-09-15 MED ORDER — INSULIN DETEMIR 100 UNIT/ML FLEXPEN: 35.0000 [IU] | PEN_INJECTOR | Freq: Every day | SUBCUTANEOUS | Status: DC

## 2015-09-15 NOTE — Progress Notes (Signed)
Patient ID: Edward [name] MRN: [id number], DOB: [birth year], 80 y.o. Date of Encounter: 09/15/2015, 9:13 AM    Chief Complaint:  Chief Complaint  Patient presents with  . 2 week follow up    f/u blood sugar     HPI: 80 y.o. year old male her for above.  His last routine visit with me was November 28. At that time A1c came back at 14.0. Told him to increase Levemir from 25 units to 30 units. Told him to document blood sugar log and bring that to his follow-up visit 2 weeks later which is today.  Today he states that he is taking the Levemir at 30 units now. Discussed this repeatedly even showed him another insulin pen and make sure that he is dialing neck to 30 and that's what he is doing and he agrees.  He brings in his meter which we reviewed his readings. Beginning December 14 going down to December 2--the readings are as follows.----Also note that all of these readings are performed at about 2:52 AM to 4:24 AM. 186 300 225 283 189 249 280 248 242 356  311 198         Home Meds:   Outpatient Prescriptions Prior to Visit  Medication Sig Dispense Refill  . albuterol (PROVENTIL) (2.5 MG/3ML) 0.083% nebulizer solution INHALE 1 VIAL VIA NEBULIZER EVERY 6 HOURS AS NEEDED FOR WHEEZING OR SHORTNESS OF BREATH 360 mL 5  . aspirin 81 MG EC tablet TAKE 1 TABLET BY MOUTH ONCE DAILY 30 tablet 3  . budesonide (PULMICORT) 0.5 MG/2ML nebulizer solution Take 2 mLs (0.5 mg total) by nebulization 2 (two) times daily. 120 mL 12  . EASY TOUCH PEN NEEDLES 31G X 8 MM MISC USE 1 PEN NEEDLE ONCE DAILY AS DIRECTED 100 each 3  . losartan (COZAAR) 100 MG tablet TAKE 1 TABLET BY MOUTH ONCE DAILY 30 tablet 3  . metFORMIN (GLUCOPHAGE) 1000 MG tablet TAKE 1 TABLET BY MOUTH 2 TIMES A DAY 60 tablet 3  . Omega-3 Fatty Acids (FISH OIL) 1000 MG CAPS Take 1 capsule by mouth 2 (two) times daily.     . ONE TOUCH ULTRA TEST test strip CHECK FASTING BLOOD SUGARS EVERY MORNING  11  . ONETOUCH DELICA  LANCETS 99991111 MISC USE TO CHECK BLOOD SUGAR TWICE DAILY AS DIRECTED 100 each 3  . pravastatin (PRAVACHOL) 80 MG tablet TAKE 1 TABLET BY MOUTH ONCE DAILY AT BEDTIME 30 tablet 3  . predniSONE (DELTASONE) 20 MG tablet Take 1 tablet (20 mg total) by mouth daily with breakfast. (Patient not taking: Reported on 09/15/2015) 5 tablet 0  . PROAIR RESPICLICK 123XX123 (90 BASE) MCG/ACT AEPB INHALE 2 PUFFS INTO LUNGS EVERY 6 HOURS AS NEEDED FOR WHEEZING OR SHORTNESS OF BREATH 1 each 5  . Tiotropium Bromide-Olodaterol (STIOLTO RESPIMAT) 2.5-2.5 MCG/ACT AERS Inhale 2 Inhalers into the lungs daily. 1 Inhaler 5  . Insulin Detemir (LEVEMIR FLEXTOUCH) 100 UNIT/ML Pen Inject 25 Units into the skin daily at 10 pm. 25 units at bedtime 15 mL 11   No facility-administered medications prior to visit.    Allergies:  Allergies  Allergen Reactions  . Ace Inhibitors     Hyperkalemia--07/23/2013:patient states not familiar with the following allergy      Review of Systems: See HPI for pertinent ROS. All other ROS negative.    Physical Exam: Blood pressure 122/74, pulse 84, temperature 98.5 F (36.9 C), temperature source Oral, resp. rate 18, weight 160 lb (72.576 kg)., Body mass  index is 26.63 kg/(m^2). General:  Appears in no acute distress. Neck: Supple. No thyromegaly. No lymphadenopathy. Lungs: Clear bilaterally to auscultation without wheezes, rales, or rhonchi. Breathing is unlabored. Heart: Regular rhythm. No murmurs, rubs, or gallops. Msk:  Strength and tone normal for age. Extremities/Skin: Warm and dry.  No edema. Neuro: Alert and oriented X 3. Moves all extremities spontaneously. Gait is normal. CNII-XII grossly in tact. Psych:  Responds to questions appropriately with a normal affect.     ASSESSMENT AND PLAN:  80 y.o. year old male with  1. Type 2 diabetes mellitus with hyperglycemia, with long-term current use of insulin ([name]) He is to increase the Levemir to 35 units at bedtime.  He is to continue  checking fasting blood sugars every morning.  He will schedule follow-up visit with me Monday which is in 5 days.  Will review his blood sugar readings at that time and adjust medicines accordingly.  - Insulin Detemir (LEVEMIR FLEXTOUCH) 100 UNIT/ML Pen; Inject 35 Units into the skin daily at 10 pm. 35 units at bedtime  Dispense: 15 mL; Refill: [address] [place], [place], [facility] 09/15/2015 9:13 AM

## 2015-09-16 ENCOUNTER — Other Ambulatory Visit: Payer: Self-pay | Admitting: Physician Assistant

## 2015-09-16 NOTE — Telephone Encounter (Signed)
Test strips refilled

## 2015-09-18 DIAGNOSIS — J449 Chronic obstructive pulmonary disease, unspecified: Secondary | ICD-10-CM | POA: Diagnosis not present

## 2015-09-20 ENCOUNTER — Encounter: Payer: Self-pay | Admitting: Physician Assistant

## 2015-09-20 ENCOUNTER — Ambulatory Visit (INDEPENDENT_AMBULATORY_CARE_PROVIDER_SITE_OTHER): Payer: Medicare Other | Admitting: Physician Assistant

## 2015-09-20 VITALS — BP 122/76 | HR 88 | Temp 98.6°F | Resp 20 | Wt 159.0 lb

## 2015-09-20 DIAGNOSIS — Z794 Long term (current) use of insulin: Secondary | ICD-10-CM | POA: Diagnosis not present

## 2015-09-20 DIAGNOSIS — E1165 Type 2 diabetes mellitus with hyperglycemia: Secondary | ICD-10-CM

## 2015-09-20 MED ORDER — INSULIN DETEMIR 100 UNIT/ML FLEXPEN: PEN_INJECTOR | SUBCUTANEOUS | Status: DC

## 2015-09-20 NOTE — Progress Notes (Signed)
Patient ID: ISAY TROCHEZ MRN: [id number], DOB: 1935/03/28, 80 y.o. Date of Encounter: 09/20/2015, 12:13 PM    Chief Complaint:  Chief Complaint  Patient presents with  . follow up     HPI: 80 y.o. year old male here to f/u regarding uncontrolled BS.   His last routine 3 month OV with me was 08/30/15. At that time, HgbA1C came back at 14.  Told him to increase Levemir from 25 to 30 units.  Told him to document BS log and bring to f/u Ov 2 weeks later.  He did this. He returned for f/u OV 09/15/15. At that [place], reported he had increased to using 30 units.  Brought in meter and reviewed BS readings.  Increased to 35 units.   Today pt says he is using 35 units QHS.  Today he brings in a BS log sheet. Also bring in meter.  I asked if he sometimes checks BS with a different meter but he says this is the meter he always checks BS with.  On his sheet, he has the following BSs written (but no date): 168, 153 170 182 125 151 134 145 168 128  However, his meter shows; 12/19  4am---240 12/18  3:10am---338 12/17--3:24 am---293 12/16---3:07am---234 12/15--3:21am----251 12/14---4am----186 12/13----2:52am---300 12/12/-----253     Home Meds:   Outpatient Prescriptions Prior to Visit  Medication Sig Dispense Refill  . albuterol (PROVENTIL) (2.5 MG/3ML) 0.083% nebulizer solution INHALE 1 VIAL VIA NEBULIZER EVERY 6 HOURS AS NEEDED FOR WHEEZING OR SHORTNESS OF BREATH 360 mL 5  . aspirin 81 MG EC tablet TAKE 1 TABLET BY MOUTH ONCE DAILY 30 tablet 3  . budesonide (PULMICORT) 0.5 MG/2ML nebulizer solution Take 2 mLs (0.5 mg total) by nebulization 2 (two) times daily. 120 mL 12  . EASY TOUCH PEN NEEDLES 31G X 8 MM MISC USE 1 PEN NEEDLE ONCE DAILY AS DIRECTED 100 each 3  . losartan (COZAAR) 100 MG tablet TAKE 1 TABLET BY MOUTH ONCE DAILY 30 tablet 3  . metFORMIN (GLUCOPHAGE) 1000 MG tablet TAKE 1 TABLET BY MOUTH 2 TIMES A DAY 60 tablet 3  . Omega-3 Fatty Acids (FISH OIL) 1000  MG CAPS Take 1 capsule by mouth 2 (two) times daily.     . ONE TOUCH ULTRA TEST test strip CHECK FASTING BLOOD SUGARS EVERY MORNING 100 each 3  . ONETOUCH DELICA LANCETS 99991111 MISC USE TO CHECK BLOOD SUGAR TWICE DAILY AS DIRECTED 100 each 3  . pravastatin (PRAVACHOL) 80 MG tablet TAKE 1 TABLET BY MOUTH ONCE DAILY AT BEDTIME 30 tablet 3  . PROAIR RESPICLICK 123XX123 (90 BASE) MCG/ACT AEPB INHALE 2 PUFFS INTO LUNGS EVERY 6 HOURS AS NEEDED FOR WHEEZING OR SHORTNESS OF BREATH 1 each 5  . Tiotropium Bromide-Olodaterol (STIOLTO RESPIMAT) 2.5-2.5 MCG/ACT AERS Inhale 2 Inhalers into the lungs daily. 1 Inhaler 5  . Insulin Detemir (LEVEMIR FLEXTOUCH) 100 UNIT/ML Pen Inject 35 Units into the skin daily at 10 pm. 35 units at bedtime 15 mL 11  . predniSONE (DELTASONE) 20 MG tablet Take 1 tablet (20 mg total) by mouth daily with breakfast. (Patient not taking: Reported on 09/20/2015) 5 tablet 0   No facility-administered medications prior to visit.    Allergies:  Allergies  Allergen Reactions  . Ace Inhibitors     Hyperkalemia--07/23/2013:patient states not familiar with the following allergy      Review of Systems: See HPI for pertinent ROS. All other ROS negative.    Physical Exam: Blood pressure 122/76,  pulse 88, temperature 98.6 F (37 C), temperature source Oral, resp. rate 20, weight 159 lb (72.122 kg)., Body mass index is 26.46 kg/(m^2). General: WM.  Appears in no acute distress. Neck: Supple. No thyromegaly. No lymphadenopathy. Lungs: Clear bilaterally to auscultation without wheezes, rales, or rhonchi. Breathing is unlabored. Heart: Regular rhythm. No murmurs, rubs, or gallops. Msk:  Strength and tone normal for age. Extremities/Skin: Warm and dry.  Neuro: Alert and oriented X 3. Moves all extremities spontaneously. Gait is normal. CNII-XII grossly in tact. Psych:  Responds to questions appropriately with a normal affect.     ASSESSMENT AND PLAN:  80 y.o. year old male with  1. Type 2  diabetes mellitus with hyperglycemia, with long-term current use of insulin ([place]) I wonder if he is illiterit.  I am going with readings on his meter, not ones written down.  Also, readings on meter c/w last A1C. He is to increase Levemir to 40 units QHS and return for another f/u OV in 1 week. Bring meter to that appt.  - Insulin Detemir (LEVEMIR FLEXTOUCH) 100 UNIT/ML Pen; 40 units at bedtime  Dispense: 15 mL; Refill: [address] [place], [place], [facility] 09/20/2015 12:13 PM

## 2015-09-23 DIAGNOSIS — R0600 Dyspnea, unspecified: Secondary | ICD-10-CM | POA: Diagnosis not present

## 2015-09-23 DIAGNOSIS — J441 Chronic obstructive pulmonary disease with (acute) exacerbation: Secondary | ICD-10-CM | POA: Diagnosis not present

## 2015-09-23 DIAGNOSIS — J40 Bronchitis, not specified as acute or chronic: Secondary | ICD-10-CM | POA: Diagnosis not present

## 2015-09-23 DIAGNOSIS — J8410 Pulmonary fibrosis, unspecified: Secondary | ICD-10-CM | POA: Diagnosis not present

## 2015-10-12 DIAGNOSIS — J8410 Pulmonary fibrosis, unspecified: Secondary | ICD-10-CM | POA: Diagnosis not present

## 2015-10-12 DIAGNOSIS — R0600 Dyspnea, unspecified: Secondary | ICD-10-CM | POA: Diagnosis not present

## 2015-10-12 DIAGNOSIS — J441 Chronic obstructive pulmonary disease with (acute) exacerbation: Secondary | ICD-10-CM | POA: Diagnosis not present

## 2015-10-12 DIAGNOSIS — J40 Bronchitis, not specified as acute or chronic: Secondary | ICD-10-CM | POA: Diagnosis not present

## 2015-10-19 DIAGNOSIS — J40 Bronchitis, not specified as acute or chronic: Secondary | ICD-10-CM | POA: Diagnosis not present

## 2015-10-19 DIAGNOSIS — J449 Chronic obstructive pulmonary disease, unspecified: Secondary | ICD-10-CM | POA: Diagnosis not present

## 2015-10-19 DIAGNOSIS — J441 Chronic obstructive pulmonary disease with (acute) exacerbation: Secondary | ICD-10-CM | POA: Diagnosis not present

## 2015-10-19 DIAGNOSIS — J8410 Pulmonary fibrosis, unspecified: Secondary | ICD-10-CM | POA: Diagnosis not present

## 2015-10-19 DIAGNOSIS — R0600 Dyspnea, unspecified: Secondary | ICD-10-CM | POA: Diagnosis not present

## 2015-11-01 ENCOUNTER — Other Ambulatory Visit: Payer: Self-pay | Admitting: Physician Assistant

## 2015-11-01 DIAGNOSIS — IMO0001 Reserved for inherently not codable concepts without codable children: Secondary | ICD-10-CM

## 2015-11-01 DIAGNOSIS — E1165 Type 2 diabetes mellitus with hyperglycemia: Principal | ICD-10-CM

## 2015-11-01 NOTE — Telephone Encounter (Signed)
Test strips refilled

## 2015-11-12 DIAGNOSIS — J441 Chronic obstructive pulmonary disease with (acute) exacerbation: Secondary | ICD-10-CM | POA: Diagnosis not present

## 2015-11-12 DIAGNOSIS — R0600 Dyspnea, unspecified: Secondary | ICD-10-CM | POA: Diagnosis not present

## 2015-11-12 DIAGNOSIS — J40 Bronchitis, not specified as acute or chronic: Secondary | ICD-10-CM | POA: Diagnosis not present

## 2015-11-12 DIAGNOSIS — J8410 Pulmonary fibrosis, unspecified: Secondary | ICD-10-CM | POA: Diagnosis not present

## 2015-11-19 ENCOUNTER — Other Ambulatory Visit: Payer: Self-pay | Admitting: Family Medicine

## 2015-11-19 DIAGNOSIS — J449 Chronic obstructive pulmonary disease, unspecified: Secondary | ICD-10-CM | POA: Diagnosis not present

## 2015-11-19 MED ORDER — BUDESONIDE 0.5 MG/2ML IN SUSP: 0.5000 mg | Freq: Two times a day (BID) | RESPIRATORY_TRACT | Status: DC

## 2015-11-19 MED ORDER — ALBUTEROL SULFATE (2.5 MG/3ML) 0.083% IN NEBU: INHALATION_SOLUTION | RESPIRATORY_TRACT | Status: DC

## 2015-12-01 ENCOUNTER — Ambulatory Visit (INDEPENDENT_AMBULATORY_CARE_PROVIDER_SITE_OTHER): Payer: Medicare Other | Admitting: Physician Assistant

## 2015-12-01 ENCOUNTER — Encounter: Payer: Self-pay | Admitting: Physician Assistant

## 2015-12-01 VITALS — BP 122/66 | HR 86 | Temp 98.0°F | Resp 20 | Ht 65.0 in | Wt 164.0 lb

## 2015-12-01 DIAGNOSIS — R972 Elevated prostate specific antigen [PSA]: Secondary | ICD-10-CM

## 2015-12-01 DIAGNOSIS — E1165 Type 2 diabetes mellitus with hyperglycemia: Secondary | ICD-10-CM | POA: Diagnosis not present

## 2015-12-01 DIAGNOSIS — J8410 Pulmonary fibrosis, unspecified: Secondary | ICD-10-CM

## 2015-12-01 DIAGNOSIS — E785 Hyperlipidemia, unspecified: Secondary | ICD-10-CM

## 2015-12-01 DIAGNOSIS — I10 Essential (primary) hypertension: Secondary | ICD-10-CM | POA: Diagnosis not present

## 2015-12-01 DIAGNOSIS — Z794 Long term (current) use of insulin: Secondary | ICD-10-CM | POA: Diagnosis not present

## 2015-12-01 DIAGNOSIS — K635 Polyp of colon: Secondary | ICD-10-CM

## 2015-12-01 DIAGNOSIS — J438 Other emphysema: Secondary | ICD-10-CM

## 2015-12-01 LAB — HEMOGLOBIN A1C, FINGERSTICK: Hgb A1C (fingerstick): >14 % — ABNORMAL HIGH (ref <5.7)

## 2015-12-01 MED ORDER — INSULIN DETEMIR 100 UNIT/ML FLEXPEN: PEN_INJECTOR | SUBCUTANEOUS | Status: DC

## 2015-12-01 NOTE — Progress Notes (Signed)
Patient ID: Edward [name] MRN: [id number], DOB: [birth year], 81 y.o. Date of Encounter: @DATE @  Chief Complaint:  Chief Complaint  Patient presents with  . Medication Management    Pt fasting  . Medication Refill    HPI: 81 y.o. year old male  presents for routine followup office visit.  At [place] 12/01/2015---Instructed him to increase his insulin to 45units.  F/U OV 2 weeks. Bring BS meter/log to that appt.     At office visit with me 10/30/13 at which time we stopped multiple oral medicines and started Lantus. On 11/04/13 he had an ER visit with COPD. On 11/06/13 he had another ER visit with COPD. 11/10/2013 he had office visit with me to followup with COPD. 11/15/13 he was hospitalized with COPD. 12/01/13 have followup office visit with me regarding his COPD. 12/08/13 he had another followup office visit with me.  In the past I had placed him on Spiriva and Symbicort for his COPD. Repeatedly, he was noncompliant with these secondary to finances. At office visits I gave him samples repeatedly.  He is currently on budesonide Pulmicort nebulizer. Also currently on tiotropium bromide--Stiolto Respimat. Also nasal cannula oxygen. Also albuterol nebulizer.  Also in the past we had [facility] evaluate and visit him but then he was not compliant with following up with them and letting them help him.  We recently did contact them again and I do see that they have entered recent notes starting on 01/25/15 up through 02/09/15.  At visit 02/25/15 he states that he is "all choked up" says that he has phlegm in his chest and throat. In using his nebulizer a lot for 5 times a day and at night. Says that he has oxygen at home but he just uses that one or 2 times per day as needed and sometimes at night.  At visit 02/25/15 he did bring in blood sugar log sheet. He does have documented a fasting reading for every single morning. No other readings at any other times of day. For the month of May all of his  readings are ranging 121-200. He only has 3 readings above 200. May 12 was 230. May 14 was 206. May 26-220 For the month of April fasting readings all range from 109 to 215. However most of these readings are around 130. For the month of March, fasting readings range mostly from 143 to around 170.   At [place] 05/27/2015-- He reports "I guess you saw I was in the hospital"---05/09/15--Preseptal Cellulitis.  -----I asked today if he had f/u with Ophthalmolgist and he says he did not---b/c that's when his wife fell. Says his wife has been in the hospital for 2 weeks. Says she is now in [place] at [employer]. Says "all that running back and forth with her has run me ragged"  I see notes in Epic from"[facility]"---I asked pt if any nurses/staff have come to his house--and he says "no" and acts like he has seen/heard nothing of this.-- I then reviewed further---[facility] Note 03/22/2015---documents that pt does not want further f/u from them--"does not see the need" He also brings in BS readings.  I reviewed my last set of labs---that result note said to start documenting 2 hour post prandial BS readings.  However, readings today are all fasting but are documented for each/every day.  For July: 130s, 160s, also 200, 253, 220, 268 For August: 140-150 mostly, but also 170, 211, 212, 181. One reading of 278---but only one this  high.  At [place] 08/30/2015: Reviewed that last A1C 05/27/15 was >14.  He says that at that time he did increase Levemir from 20 units QHS to 25 units QHS.  Brings BS log: Only has Fasting readings: FOr every morning : Some 131, 121, 134, 111,  Most are 150-160 range. Highest: 200 and 284 but these are the only 2 high readings like this.   Says he has cough at night that keeps him awake--says some nights not so bad, but some nights, cough when lay down. Uses Albuterol multiple times in the night on those nights.  Says he is using all Pulm meds as directed and that they have not run out.    His last Routine OV was 08/30/2015, but after that, he had f/u OVs to f/u glucose and adjust Insulin.  At visit 08/30/15 A1c was 14 that he was told to increase Levemir from 25-30 units in follow-up 2 weeks. He had visit 09/15/15--- that time he had increased and was at Levemir 30 units. At that visit had him increase further to 35 units. He then had follow-up visit 09/20/15--at time he reported that he was up to 35 units. At that visit increased further to 40 units.  12/01/2015: States that his insulin is at 40 units. He brings in blood sugar log sheet with fasting readings daily. These readings include: 278, 230, 197, 269, 218, 287, 241, 259, 245, 221, 193, 140, 197, 191, 190, 233, 323. He brought in no readings from any other time of day. These were all fasting. Also brings me a copy of print out that is by [place] that lists all of his medications and information including price. He tells me that he recently had to stop the [employer] and the albuterol because of cost. However the cost for the albuterol says just $3.85  and the [employer] says $8.10 .  He continues to stay off of his cigarettes. Says he hasn't smoked in over 2 years.  Reports that he is taking blood pressure medication as directed with no headedness. Reports that he is taking his pravastatin with no myalgias or other adverse effects.       Past Medical History  Diagnosis Date  . Diabetes mellitus   . COPD (chronic obstructive pulmonary disease) ([place])   . Hypertension   . Allergy     Rhinitis  . Elevated lipids   . Pulmonary fibrosis ([place])   . Bronchitis   . Colon polyps   . Vitamin D deficiency   . PSA elevation   . Chronic respiratory failure ([place])   . Hypercholesterolemia   . On home O2     2L N/C      Home Meds: Outpatient Prescriptions Prior to Visit  Medication Sig Dispense Refill  . albuterol (PROVENTIL) (2.5 MG/3ML) 0.083% nebulizer solution INHALE 1 VIAL VIA NEBULIZER EVERY 6  HOURS AS NEEDED FOR WHEEZING OR SHORTNESS OF BREATH 360 mL 5  . aspirin 81 MG EC tablet TAKE 1 TABLET BY MOUTH ONCE DAILY 30 tablet 3  . budesonide (PULMICORT) 0.5 MG/2ML nebulizer solution Take 2 mLs (0.5 mg total) by nebulization 2 (two) times daily. 120 mL 5  . EASY TOUCH PEN NEEDLES 31G X 8 MM MISC USE 1 PEN NEEDLE ONCE DAILY AS DIRECTED 100 each 3  . glucose blood (ONE TOUCH ULTRA TEST) test strip 1 each by Other route 2 (two) times daily. Check blood sugar each morning and then 2hrs after any meal. 100  each 11  . Insulin Detemir (LEVEMIR FLEXTOUCH) 100 UNIT/ML Pen 40 units at bedtime 15 mL 11  . losartan (COZAAR) 100 MG tablet TAKE 1 TABLET BY MOUTH ONCE DAILY 30 tablet 3  . metFORMIN (GLUCOPHAGE) 1000 MG tablet TAKE 1 TABLET BY MOUTH 2 TIMES A DAY 60 tablet 3  . Omega-3 Fatty Acids (FISH OIL) 1000 MG CAPS Take 1 capsule by mouth 2 (two) times daily.     [name] DELICA LANCETS 99991111 MISC USE TO CHECK BLOOD SUGAR TWICE DAILY AS DIRECTED 100 each 3  . pravastatin (PRAVACHOL) 80 MG tablet TAKE 1 TABLET BY MOUTH ONCE DAILY AT BEDTIME 30 tablet 3  . PROAIR RESPICLICK 123XX123 (90 BASE) MCG/ACT AEPB INHALE 2 PUFFS INTO LUNGS EVERY 6 HOURS AS NEEDED FOR WHEEZING OR SHORTNESS OF BREATH 1 each 5  . Tiotropium Bromide-Olodaterol (STIOLTO RESPIMAT) 2.5-2.5 MCG/ACT AERS Inhale 2 Inhalers into the lungs daily. 1 Inhaler 5   No facility-administered medications prior to visit.     Allergies:  Allergies  Allergen Reactions  . Ace Inhibitors     Hyperkalemia--07/23/2013:patient states not familiar with the following allergy    Social History   Social History  . Marital Status: Married    Spouse Name: N/A  . Number of Children: N/A  . Years of Education: N/A   Occupational History  . 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 Smoking status: Former Smoker -- 1.50 packs/day for 60 years    Types: Cigarettes    Quit date: 12/31/2012  . Smokeless tobacco: Never Used  .  Alcohol Use: No  . Drug Use: No  . Sexual Activity: Yes    Birth Control/ Protection: None   Other Topics Concern  . Not on file   Social History Narrative    Family History  Problem Relation Age of Onset  . CAD Other   . Diabetes Other   . Heart disease Mother      Review of Systems:  See HPI for pertinent ROS. All other ROS negative.    Physical Exam: Blood pressure 122/66, pulse 86, temperature 98 F (36.7 C), temperature source Oral, resp. rate 20, height 5\' 5"  (1.651 m), weight 164 lb (74.39 kg)., Body mass index is 27.29 kg/(m^2).  General: WNWD WM, "Weathered Appearance" Appears in no acute distress. Neck: Supple. No thyromegaly. No lymphadenopathy. No carotid bruits. Lungs: Very slight wheeze heard at right lower lung. Remainder clear. But distant BS, decreased air movement throughout.  Heart: RRR with S1 S2. No murmurs, rubs, or gallops. Abdomen: Soft, non-tender, non-distended with normoactive bowel sounds. No hepatomegaly. No rebound/guarding. No obvious abdominal masses. Musculoskeletal:  Strength and tone normal for age. Extremities/Skin: Warm and dry. No edema.  Neuro: Alert and oriented X 3. Moves all extremities spontaneously. Gait is normal. CNII-XII grossly in tact. Psych:  Responds to questions appropriately with a normal affect. Diabetic foot exam: Inspection is normal. No wounds or concerning lesions. Sensation is intact. On the right: 2+ dorsalis pedis 2+ PT. On the left 1+ DP. 2+ PT.      ASSESSMENT AND PLAN:  81 y.o. year old male with   1. History of repeated noncompliance in the past secondary to finances.  In the past he frequently could not afford all of his oral diabetic medications. In the past has also reported that he is unable to afford his Spiriva and Symbicort. We have been providing him with samples of both Spiriva  and Symbicort. As well, we have been providing him with samples of Lantus or Levemir as often as possible. At his visit  with me 03/12/14 I had [name] to contact [employer] to see whether they can help provide some assistance to him. [facility] follow-up with him but he did not want their assistance --01/2015--[facility] has reached out to pt again.  Is now on Neb meds At [place] 05/27/2015----I asked pt about "[facility]" and he says "no"--nobody coming to his house, nobody assisting him with his treatment-- I then reviewed further---[facility] Note 03/22/2015---documents that pt does not want further f/u from them--"does not see the need"  At [facility] 12/01/2015--Reports not currently using Pulmonary meds sec to cost---At that OV I gave him 2 samples of Advair to use for now.   2. history of smoking. He quit smoking January 2014. Congratulations and staying off of the cigarettes!!  3. COPD At [facility] 08/30/15--will add low dose prednisone---do not want BS to increase furhter but do not want him to develop COPD exacerbation Status post ER visit for COPD on 11/04/13 Another ER visit for COPD on 11/06/13 Status post hospitalization for COPD exacerbation 11/15/13 See HPI regarding Spiriva and Symbicort.  He is on now on Pulmicort nebulizer (budesonide) He is now on Stiolto Respimat (tiotropium bromide) He has oxygen nasal cannula at home but is not using it regularly.  Diabetes  At [facility] 12/01/2015---Instructed him to increase his insulin to 45units.  F/U OV 2 weeks. Bring BS meter/log to that appt.   - Hemoglobin A1c  Microalbumin 05/27/2015 I have discussed need for routine eye exams and dental exams but he hasn't been able to followup with the secondary to finances. On ARB. (H/O hyperkalemia with ACE Inh) On statin. On ASA 81mg .   2. Hypertension At [facility] 07/23/13 his potassium was even higher. It had been borderline high prior to this. At that time his ACE inhibitor was stopped and he was started on ARB. BP hgih at [place] 05/27/2015----will monitor prior to adjusting meds---pt says "it is b/c he has been running ragged b/c of his wife being in  the hospital" BP good at [facility] 08/30/15  3. Elevated lipids He is on Pravastatin 80mg . FLP excellent 02/25/2015 with LDL 68. LFTs were normal.  5. Pulmonary fibrosis Managed by Pulmonary  6. Elevated PSA He had elevated PSA at [facility] 07/2012. Also prior to this PSA was elevated. He has been informed this could be secondary to prostate cancer but refused followup with urology. He still refuses to recheck PSA. He is aware that he could have prostate cancer but does not want further evaluation or followup with his AT ALL.  7. Colon polyps History of Hemoccult-positive stool x3 in 07/2008. Refused GI evaluation despite being informed of risk of severe GI bleed, cancer, et Ronney Asters. H./H. normal 03/2010 and 01/2011. Refuses colonoscopy.  8. Immunizations: We have discussed influenza vaccine last flu season and he is deferred. Pneumonia vaccine: He received Pneumovax June 2003.  Received  Prevnar 13  10/23/2013 .  Tetanus: Gave in September 2005. Not covered by Medicare --therefore has deferred f/u sec to cost.  Zostavax: He has had so much problem with compliance, I have not even discussed this with pt!!  F/U OV 2 weeks.    Signed, [address] [place], [place], [facility] 12/01/2015 8:10 AM

## 2015-12-02 ENCOUNTER — Encounter: Payer: Self-pay | Admitting: Family Medicine

## 2015-12-02 DIAGNOSIS — Z9114 Patient's other noncompliance with medication regimen: Secondary | ICD-10-CM | POA: Insufficient documentation

## 2015-12-08 ENCOUNTER — Ambulatory Visit (INDEPENDENT_AMBULATORY_CARE_PROVIDER_SITE_OTHER): Payer: Medicare Other | Admitting: Physician Assistant

## 2015-12-08 ENCOUNTER — Encounter: Payer: Self-pay | Admitting: Physician Assistant

## 2015-12-08 VITALS — BP 134/60 | HR 96 | Temp 98.3°F | Resp 20 | Wt 165.0 lb

## 2015-12-08 DIAGNOSIS — E1165 Type 2 diabetes mellitus with hyperglycemia: Secondary | ICD-10-CM

## 2015-12-08 DIAGNOSIS — Z794 Long term (current) use of insulin: Secondary | ICD-10-CM

## 2015-12-08 MED ORDER — INSULIN DETEMIR 100 UNIT/ML FLEXPEN: PEN_INJECTOR | SUBCUTANEOUS | Status: DC

## 2015-12-08 NOTE — Progress Notes (Signed)
Patient ID: Edward [name] MRN: [id number], DOB: [birth year], 81 y.o. Date of Encounter: @DATE @  Chief Complaint:  Chief Complaint  Patient presents with  . 1 week follow up    HPI: 81 y.o. year old male  presents for routine followup office visit.    At office visit with me 10/30/13 at which time we stopped multiple oral medicines and started Lantus. On 11/04/13 he had an ER visit with COPD. On 11/06/13 he had another ER visit with COPD. 11/10/2013 he had office visit with me to followup with COPD. 11/15/13 he was hospitalized with COPD. 12/01/13 have followup office visit with me regarding his COPD. 12/08/13 he had another followup office visit with me.  In the past I had placed him on Spiriva and Symbicort for his COPD. Repeatedly, he was noncompliant with these secondary to finances. At office visits I gave him samples repeatedly.  He is currently on budesonide Pulmicort nebulizer. Also currently on tiotropium bromide--Stiolto Respimat. Also nasal cannula oxygen. Also albuterol nebulizer.  Also in the past we had [facility] evaluate and visit him but then he was not compliant with following up with them and letting them help him.  We recently did contact them again and I do see that they have entered recent notes starting on 01/25/15 up through 02/09/15.  At visit 02/25/15 he states that he is "all choked up" says that he has phlegm in his chest and throat. In using his nebulizer a lot for 5 times a day and at night. Says that he has oxygen at home but he just uses that one or 2 times per day as needed and sometimes at night.  At visit 02/25/15 he did bring in blood sugar log sheet. He does have documented a fasting reading for every single morning. No other readings at any other times of day. For the month of May all of his readings are ranging 121-200. He only has 3 readings above 200. May 12 was 230. May 14 was 206. May 26-220 For the month of April fasting readings all range from 109 to  215. However most of these readings are around 130. For the month of March, fasting readings range mostly from 143 to around 170.   At [place] 05/27/2015-- He reports "I guess you saw I was in the hospital"---05/09/15--Preseptal Cellulitis.  -----I asked today if he had f/u with Ophthalmolgist and he says he did not---b/c that's when his wife fell. Says his wife has been in the hospital for 2 weeks. Says she is now in [place] at [employer]. Says "all that running back and forth with her has run me ragged"  I see notes in Epic from"[facility]"---I asked pt if any nurses/staff have come to his house--and he says "no" and acts like he has seen/heard nothing of this.-- I then reviewed further---[facility] Note 03/22/2015---documents that pt does not want further f/u from them--"does not see the need" He also brings in BS readings.  I reviewed my last set of labs---that result note said to start documenting 2 hour post prandial BS readings.  However, readings today are all fasting but are documented for each/every day.  For July: 130s, 160s, also 200, 253, 220, 268 For August: 140-150 mostly, but also 170, 211, 212, 181. One reading of 278---but only one this high.  At [place] 08/30/2015: Reviewed that last A1C 05/27/15 was >14.  He says that at that time he did increase Levemir from 20 units QHS to 25 units  QHS.  Brings BS log: Only has Fasting readings: FOr every morning : Some 131, 121, 134, 111,  Most are 150-160 range. Highest: 200 and 284 but these are the only 2 high readings like this.   Says he has cough at night that keeps him awake--says some nights not so bad, but some nights, cough when lay down. Uses Albuterol multiple times in the night on those nights.  Says he is using all Pulm meds as directed and that they have not run out.   His last Routine OV was 08/30/2015, but after that, he had f/u OVs to f/u glucose and adjust Insulin.  At visit 08/30/15 A1c was 14 that he was told to increase Levemir  from 25-30 units in follow-up 2 weeks. He had visit 09/15/15--- that time he had increased and was at Levemir 30 units. At that visit had him increase further to 35 units. He then had follow-up visit 09/20/15--at time he reported that he was up to 35 units. At that visit increased further to 40 units.  12/01/2015: States that his insulin is at 40 units. He brings in blood sugar log sheet with fasting readings daily. These readings include: 278, 230, 197, 269, 218, 287, 241, 259, 245, 221, 193, 140, 197, 191, 190, 233, 323. He brought in no readings from any other time of day. These were all fasting. Also brings me a copy of print out that is by [facility] that lists all of his medications and information including price. He tells me that he recently had to stop the [employer] and the albuterol because of cost. However the cost for the albuterol says just $3.85  and the [employer] says $8.10 .  He continues to stay off of his cigarettes. Says he hasn't smoked in over 2 years.  Reports that he is taking blood pressure medication as directed with no headedness. Reports that he is taking his pravastatin with no myalgias or other adverse effects.   At [place] 12/01/15-- Told him to increase Insulin to 45 units and return in 1 week for f/u OV--to bring BS meter and log sheet to that appt  At [place] 12/08/2015: He brings his BS meter.  He brings BS log sheet.  I reviewed the numbers in the meter to make sure that these were accurate and an documenting the readings from the meter: All of these are fasting readings : 3/8-----188 3/7---------- 187 3/6-------------- 305 3/5---------- 205 3/4---------- 269 3/3--------- 227 3/2--------- 277 3/1--------- 332  Did look at his blood sugar log sheet. He has different numbers written on there. Asked if his wife is diabetic or if anyone else is using his meter and he says no.  I really think that he is illiterate.  For safety purposes, need to go by  readings on his meter, not on his BS log sheet.   He promises that he is taking insulin daily and is currently using 45 units-----he tells me this dose on his own-- and this is correct-- this is the amount that I told him at last visit  At this time, will have him increase to 55 units and f/u in 1 week with fasting BS readings---ON HIS METER---NOT WRITTEN ON LOG SHEET-------------    Past Medical History  Diagnosis Date  . Diabetes mellitus   . COPD (chronic obstructive pulmonary disease) ([place])   . Hypertension   . Allergy     Rhinitis  . Elevated lipids   . Pulmonary fibrosis ([place])   .  Bronchitis   . Colon polyps   . Vitamin D deficiency   . PSA elevation   . Chronic respiratory failure ([place])   . Hypercholesterolemia   . On home O2     2L N/C      Home Meds: Outpatient Prescriptions Prior to Visit  Medication Sig Dispense Refill  . albuterol (PROVENTIL) (2.5 MG/3ML) 0.083% nebulizer solution INHALE 1 VIAL VIA NEBULIZER EVERY 6 HOURS AS NEEDED FOR WHEEZING OR SHORTNESS OF BREATH 360 mL 5  . aspirin 81 MG EC tablet TAKE 1 TABLET BY MOUTH ONCE DAILY 30 tablet 3  . budesonide (PULMICORT) 0.5 MG/2ML nebulizer solution Take 2 mLs (0.5 mg total) by nebulization 2 (two) times daily. 120 mL 5  . EASY TOUCH PEN NEEDLES 31G X 8 MM MISC USE 1 PEN NEEDLE ONCE DAILY AS DIRECTED 100 each 3  . glucose blood (ONE TOUCH ULTRA TEST) test strip 1 each by Other route 2 (two) times daily. Check blood sugar each morning and then 2hrs after any meal. 100 each 11  . losartan (COZAAR) 100 MG tablet TAKE 1 TABLET BY MOUTH ONCE DAILY 30 tablet 3  . metFORMIN (GLUCOPHAGE) 1000 MG tablet TAKE 1 TABLET BY MOUTH 2 TIMES A DAY 60 tablet 3  . Omega-3 Fatty Acids (FISH OIL) 1000 MG CAPS Take 1 capsule by mouth 2 (two) times daily.     [name] DELICA LANCETS 99991111 MISC USE TO CHECK BLOOD SUGAR TWICE DAILY AS DIRECTED 100 each 3  . pravastatin (PRAVACHOL) 80 MG tablet TAKE 1 TABLET BY MOUTH ONCE DAILY AT BEDTIME  30 tablet 3  . PROAIR RESPICLICK 123XX123 (90 BASE) MCG/ACT AEPB INHALE 2 PUFFS INTO LUNGS EVERY 6 HOURS AS NEEDED FOR WHEEZING OR SHORTNESS OF BREATH 1 each 5  . Tiotropium Bromide-Olodaterol (STIOLTO RESPIMAT) 2.5-2.5 MCG/ACT AERS Inhale 2 Inhalers into the lungs daily. 1 Inhaler 5  . Insulin Detemir (LEVEMIR FLEXTOUCH) 100 UNIT/ML Pen 45 units at bedtime 15 mL 11   No facility-administered medications prior to visit.     Allergies:  Allergies  Allergen Reactions  . Ace Inhibitors     Hyperkalemia--07/23/2013:patient states not familiar with the following allergy    Social History   Social History  . Marital Status: Married    Spouse Name: N/A  . Number of Children: N/A  . Years of Education: N/A   Occupational History  . 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 Smoking status: Former Smoker -- 1.50 packs/day for 60 years    Types: Cigarettes    Quit date: 12/31/2012  . Smokeless tobacco: Never Used  . Alcohol Use: No  . Drug Use: No  . Sexual Activity: Yes    Birth Control/ Protection: None   Other Topics Concern  . Not on file   Social History Narrative    Family History  Problem Relation Age of Onset  . CAD Other   . Diabetes Other   . Heart disease Mother      Review of Systems:  See HPI for pertinent ROS. All other ROS negative.    Physical Exam: Blood pressure 134/60, pulse 96, temperature 98.3 F (36.8 C), temperature source Oral, resp. rate 20, weight 165 lb (74.844 kg)., Body mass index is 27.46 kg/(m^2).  General: WNWD WM, "Weathered Appearance" Appears in no acute distress. Neck: Supple. No thyromegaly. No lymphadenopathy. No carotid bruits. Lungs: Very slight wheeze heard at right lower lung. Remainder clear. But  distant BS, decreased air movement throughout.  Heart: RRR with S1 S2. No murmurs, rubs, or gallops. Abdomen: Soft, non-tender, non-distended with normoactive bowel sounds. No hepatomegaly. No rebound/guarding. No  obvious abdominal masses. Musculoskeletal:  Strength and tone normal for age. Extremities/Skin: Warm and dry. No edema.  Neuro: Alert and oriented X 3. Moves all extremities spontaneously. Gait is normal. CNII-XII grossly in tact. Psych:  Responds to questions appropriately with a normal affect. Diabetic foot exam: Inspection is normal. No wounds or concerning lesions. Sensation is intact. On the right: 2+ dorsalis pedis 2+ PT. On the left 1+ DP. 2+ PT.      ASSESSMENT AND PLAN:  81 y.o. year old male with   1. History of repeated noncompliance in the past secondary to finances.  In the past he frequently could not afford all of his oral diabetic medications. In the past has also reported that he is unable to afford his Spiriva and Symbicort. We have been providing him with samples of both Spiriva and Symbicort. As well, we have been providing him with samples of Lantus or Levemir as often as possible. At his visit with me 03/12/14 I had [name] to contact [employer] to see whether they can help provide some assistance to him. [facility] follow-up with him but he did not want their assistance --01/2015--[facility] has reached out to pt again.  Is now on Neb meds At [place] 05/27/2015----I asked pt about "[facility]" and he says "no"--nobody coming to his house, nobody assisting him with his treatment-- I then reviewed further---[facility] Note 03/22/2015---documents that pt does not want further f/u from them--"does not see the need"  At [facility] 12/01/2015--Reports not currently using Pulmonary meds sec to cost---At that OV I gave him 2 samples of Advair to use for now.   2. history of smoking. He quit smoking January 2014. Congratulations and staying off of the cigarettes!!  3. COPD At [facility] 08/30/15--will add low dose prednisone---do not want BS to increase furhter but do not want him to develop COPD exacerbation Status post ER visit for COPD on 11/04/13 Another ER visit for COPD on 11/06/13 Status post  hospitalization for COPD exacerbation 11/15/13 See HPI regarding Spiriva and Symbicort.  He is on now on Pulmicort nebulizer (budesonide) He is now on Stiolto Respimat (tiotropium bromide) He has oxygen nasal cannula at home but is not using it regularly.  Diabetes  At [facility] 12/08/2015---Instructed him to increase his insulin to 55units.  F/U OV 1 weeks. Bring BS meter.   - Hemoglobin A1c  Microalbumin 05/27/2015 I have discussed need for routine eye exams and dental exams but he hasn't been able to followup with the secondary to finances. On ARB. (H/O hyperkalemia with ACE Inh) On statin. On ASA 81mg .   2. Hypertension At [facility] 07/23/13 his potassium was even higher. It had been borderline high prior to this. At that time his ACE inhibitor was stopped and he was started on ARB. BP hgih at [place] 05/27/2015----will monitor prior to adjusting meds---pt says "it is b/c he has been running ragged b/c of his wife being in the hospital" BP good at [facility] 08/30/15  3. Elevated lipids He is on Pravastatin 80mg . FLP excellent 02/25/2015 with LDL 68. LFTs were normal.  5. Pulmonary fibrosis Managed by Pulmonary  6. Elevated PSA He had elevated PSA at [facility] 07/2012. Also prior to this PSA was elevated. He has been informed this could be secondary to prostate cancer but refused followup with urology. He  still refuses to recheck PSA. He is aware that he could have prostate cancer but does not want further evaluation or followup with his AT ALL.  7. Colon polyps History of Hemoccult-positive stool x3 in 07/2008. Refused GI evaluation despite being informed of risk of severe GI bleed, cancer, et Ronney Asters. H./H. normal 03/2010 and 01/2011. Refuses colonoscopy.  8. Immunizations: We have discussed influenza vaccine last flu season and he is deferred. Pneumonia vaccine: He received Pneumovax June 2003.  Received  Prevnar 13  10/23/2013 .  Tetanus: Gave in September 2005. Not covered by Medicare --therefore has  deferred f/u sec to cost.  Zostavax: He has had so much problem with compliance, I have not even discussed this with pt!!  F/U OV 2 weeks.    Signed, [address] [place], [place], [facility] 12/08/2015 9:08 AM

## 2015-12-10 DIAGNOSIS — J441 Chronic obstructive pulmonary disease with (acute) exacerbation: Secondary | ICD-10-CM | POA: Diagnosis not present

## 2015-12-10 DIAGNOSIS — J8410 Pulmonary fibrosis, unspecified: Secondary | ICD-10-CM | POA: Diagnosis not present

## 2015-12-10 DIAGNOSIS — J40 Bronchitis, not specified as acute or chronic: Secondary | ICD-10-CM | POA: Diagnosis not present

## 2015-12-10 DIAGNOSIS — R0600 Dyspnea, unspecified: Secondary | ICD-10-CM | POA: Diagnosis not present

## 2015-12-15 ENCOUNTER — Ambulatory Visit (INDEPENDENT_AMBULATORY_CARE_PROVIDER_SITE_OTHER): Payer: Medicare Other | Admitting: Physician Assistant

## 2015-12-15 ENCOUNTER — Encounter: Payer: Self-pay | Admitting: Physician Assistant

## 2015-12-15 VITALS — BP 120/76 | HR 80 | Temp 97.6°F | Resp 20 | Wt 163.0 lb

## 2015-12-15 DIAGNOSIS — E1165 Type 2 diabetes mellitus with hyperglycemia: Secondary | ICD-10-CM

## 2015-12-15 DIAGNOSIS — J441 Chronic obstructive pulmonary disease with (acute) exacerbation: Secondary | ICD-10-CM | POA: Diagnosis not present

## 2015-12-15 DIAGNOSIS — Z794 Long term (current) use of insulin: Secondary | ICD-10-CM

## 2015-12-15 NOTE — Progress Notes (Signed)
Patient ID: Edward [name], male   DOB: 27-Sep-1935, 81 y.o.   MRN: [id number] Asked patient about his eating habits. He is an EARLY riser.  Gets up between 1-3 AM everyday eats oatmeal for breakfast with an occasional boiled egg.  States he never eats any lunch.  For dinner daily around 4PM he eats Pinto beans, only.  Says he does not eat any meat.

## 2015-12-15 NOTE — Progress Notes (Signed)
Patient ID: Edward [name] MRN: [id number], DOB: [birth year], 81 y.o. Date of Encounter: @DATE @  Chief Complaint:  Chief Complaint  Patient presents with  . diabetic follow up    HPI: 81 y.o. year old male  presents for routine followup office visit.    At office visit with me 10/30/13 at which time we stopped multiple oral medicines and started Lantus. On 11/04/13 he had an ER visit with COPD. On 11/06/13 he had another ER visit with COPD. 11/10/2013 he had office visit with me to followup with COPD. 11/15/13 he was hospitalized with COPD. 12/01/13 have followup office visit with me regarding his COPD. 12/08/13 he had another followup office visit with me.  In the past I had placed him on Spiriva and Symbicort for his COPD. Repeatedly, he was noncompliant with these secondary to finances. At office visits I gave him samples repeatedly.  He is currently on budesonide Pulmicort nebulizer. Also currently on tiotropium bromide--Stiolto Respimat. Also nasal cannula oxygen. Also albuterol nebulizer.  Also in the past we had [facility] evaluate and visit him but then he was not compliant with following up with them and letting them help him.  We recently did contact them again and I do see that they have entered recent notes starting on 01/25/15 up through 02/09/15.  At visit 02/25/15 he states that he is "all choked up" says that he has phlegm in his chest and throat. In using his nebulizer a lot for 5 times a day and at night. Says that he has oxygen at home but he just uses that one or 2 times per day as needed and sometimes at night.  At visit 02/25/15 he did bring in blood sugar log sheet. He does have documented a fasting reading for every single morning. No other readings at any other times of day. For the month of May all of his readings are ranging 121-200. He only has 3 readings above 200. May 12 was 230. May 14 was 206. May 26-220 For the month of April fasting readings all range from 109 to  215. However most of these readings are around 130. For the month of March, fasting readings range mostly from 143 to around 170.   At [place] 05/27/2015-- He reports "I guess you saw I was in the hospital"---05/09/15--Preseptal Cellulitis.  -----I asked today if he had f/u with Ophthalmolgist and he says he did not---b/c that's when his wife fell. Says his wife has been in the hospital for 2 weeks. Says she is now in [employer] at [employer]. Says "all that running back and forth with her has run me ragged"  I see notes in Epic from"[facility]"---I asked pt if any nurses/staff have come to his house--and he says "no" and acts like he has seen/heard nothing of this.-- I then reviewed further---[facility] Note 03/22/2015---documents that pt does not want further f/u from them--"does not see the need" He also brings in BS readings.  I reviewed my last set of labs---that result note said to start documenting 2 hour post prandial BS readings.  However, readings today are all fasting but are documented for each/every day.  For July: 130s, 160s, also 200, 253, 220, 268 For August: 140-150 mostly, but also 170, 211, 212, 181. One reading of 278---but only one this high.  At [place] 08/30/2015: Reviewed that last A1C 05/27/15 was >14.  He says that at that time he did increase Levemir from 20 units QHS to 25 units QHS.  Brings BS log: Only has Fasting readings: FOr every morning : Some 131, 121, 134, 111,  Most are 150-160 range. Highest: 200 and 284 but these are the only 2 high readings like this.   Says he has cough at night that keeps him awake--says some nights not so bad, but some nights, cough when lay down. Uses Albuterol multiple times in the night on those nights.  Says he is using all Pulm meds as directed and that they have not run out.   His last Routine OV was 08/30/2015, but after that, he had f/u OVs to f/u glucose and adjust Insulin.  At visit 08/30/15 A1c was 14 that he was told to increase Levemir  from 25-30 units in follow-up 2 weeks. He had visit 09/15/15--- that time he had increased and was at Levemir 30 units. At that visit had him increase further to 35 units. He then had follow-up visit 09/20/15--at time he reported that he was up to 35 units. At that visit increased further to 40 units.  12/01/2015: States that his insulin is at 40 units. He brings in blood sugar log sheet with fasting readings daily. These readings include: 278, 230, 197, 269, 218, 287, 241, 259, 245, 221, 193, 140, 197, 191, 190, 233, 323. He brought in no readings from any other time of day. These were all fasting. Also brings me a copy of print out that is by [place] that lists all of his medications and information including price. He tells me that he recently had to stop the [employer] and the albuterol because of cost. However the cost for the albuterol says just $3.85  and the [employer] says $8.10 .  He continues to stay off of his cigarettes. Says he hasn't smoked in over 2 years.  Reports that he is taking blood pressure medication as directed with no headedness. Reports that he is taking his pravastatin with no myalgias or other adverse effects.   At [place] 12/01/15-- Told him to increase Insulin to 45 units and return in 1 week for f/u OV--to bring BS meter and log sheet to that appt  At [place] 12/08/2015: He brings his BS meter.  He brings BS log sheet.  I reviewed the numbers in the meter to make sure that these were accurate and an documenting the readings from the meter: All of these are fasting readings : 3/8-----188 3/7---------- 187 3/6-------------- 305 3/5---------- 205 3/4---------- 269 3/3--------- 227 3/2--------- 277 3/1--------- 332  Did look at his blood sugar log sheet. He has different numbers written on there. Asked if his wife is diabetic or if anyone else is using his meter and he says no.  I really think that he is illiterate.  For safety purposes, need to go by  readings on his meter, not on his BS log sheet.   He promises that he is taking insulin daily and is currently using 45 units-----he tells me this dose on his own-- and this is correct-- this is the amount that I told him at last visit  At this time, will have him increase to 55 units and f/u in 1 week with fasting BS readings---ON HIS METER---NOT WRITTEN ON LOG SHEET-------------  AT [facility] 12/15/2015: He reports that he is now doing insulin 55 units. He did bring his blood sugar meter with him and this is what I have reviewed. Early morning readings: 12/08/15------------------ 188 12/09/15------------------ 207 12/10/15---------------- 158 12/11/15----------------- 119 12/12/15------------------ 114 12/13/15------------------ 205 12/14/2015------------- 185  12/15/15------------------ 267.  All of these fasting readings are performed around 3 AM. He states that he always wakes up around 3 AM. Says that he then eats oatmeal or sometimes a boiled egg. He does not eat any lunch. Through the day he just has water or milk. Yesterday around 4 PM he ate pinto beans. Says that later after that he had some milk and a few Nilla Wafers. Says that this is pretty average/typical of his usual daily intake. I asked if part of the reason that he eats a little is because of finances and because this is all he has to eat but he says that that is not it and says that he just isn't hungry for anything more. Asked if he snacks throughout the day and he says that he does not. Asked if he usually eats something like Nilla wafers in the evening--and he says not all the time.    Past Medical History  Diagnosis Date  . Diabetes mellitus   . COPD (chronic obstructive pulmonary disease) ([place])   . Hypertension   . Allergy     Rhinitis  . Elevated lipids   . Pulmonary fibrosis ([place])   . Bronchitis   . Colon polyps   . Vitamin D deficiency   . PSA elevation   . Chronic respiratory failure ([place])   .  Hypercholesterolemia   . On home O2     2L N/C      Home Meds: Outpatient Prescriptions Prior to Visit  Medication Sig Dispense Refill  . albuterol (PROVENTIL) (2.5 MG/3ML) 0.083% nebulizer solution INHALE 1 VIAL VIA NEBULIZER EVERY 6 HOURS AS NEEDED FOR WHEEZING OR SHORTNESS OF BREATH 360 mL 5  . aspirin 81 MG EC tablet TAKE 1 TABLET BY MOUTH ONCE DAILY 30 tablet 3  . budesonide (PULMICORT) 0.5 MG/2ML nebulizer solution Take 2 mLs (0.5 mg total) by nebulization 2 (two) times daily. 120 mL 5  . EASY TOUCH PEN NEEDLES 31G X 8 MM MISC USE 1 PEN NEEDLE ONCE DAILY AS DIRECTED 100 each 3  . glucose blood (ONE TOUCH ULTRA TEST) test strip 1 each by Other route 2 (two) times daily. Check blood sugar each morning and then 2hrs after any meal. 100 each 11  . Insulin Detemir (LEVEMIR FLEXTOUCH) 100 UNIT/ML Pen 55 units at bedtime 15 mL 11  . losartan (COZAAR) 100 MG tablet TAKE 1 TABLET BY MOUTH ONCE DAILY 30 tablet 3  . metFORMIN (GLUCOPHAGE) 1000 MG tablet TAKE 1 TABLET BY MOUTH 2 TIMES A DAY 60 tablet 3  . Omega-3 Fatty Acids (FISH OIL) 1000 MG CAPS Take 1 capsule by mouth 2 (two) times daily.     [name] DELICA LANCETS 99991111 MISC USE TO CHECK BLOOD SUGAR TWICE DAILY AS DIRECTED 100 each 3  . pravastatin (PRAVACHOL) 80 MG tablet TAKE 1 TABLET BY MOUTH ONCE DAILY AT BEDTIME 30 tablet 3  . PROAIR RESPICLICK 123XX123 (90 BASE) MCG/ACT AEPB INHALE 2 PUFFS INTO LUNGS EVERY 6 HOURS AS NEEDED FOR WHEEZING OR SHORTNESS OF BREATH 1 each 5  . Tiotropium Bromide-Olodaterol (STIOLTO RESPIMAT) 2.5-2.5 MCG/ACT AERS Inhale 2 Inhalers into the lungs daily. 1 Inhaler 5   No facility-administered medications prior to visit.     Allergies:  Allergies  Allergen Reactions  . Ace Inhibitors     Hyperkalemia--07/23/2013:patient states not familiar with the following allergy    Social History   Social History  . Marital Status: Married    Spouse Name: N/A  .  Number of Children: N/A  . Years of Education: N/A    Occupational History  . 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 Smoking status: Former Smoker -- 1.50 packs/day for 60 years    Types: Cigarettes    Quit date: 12/31/2012  . Smokeless tobacco: Never Used  . Alcohol Use: No  . Drug Use: No  . Sexual Activity: Yes    Birth Control/ Protection: None   Other Topics Concern  . Not on file   Social History Narrative    Family History  Problem Relation Age of Onset  . CAD Other   . Diabetes Other   . Heart disease Mother      Review of Systems:  See HPI for pertinent ROS. All other ROS negative.    Physical Exam: Blood pressure 120/76, pulse 80, temperature 97.6 F (36.4 C), temperature source Oral, resp. rate 20, weight 163 lb (73.936 kg)., Body mass index is 27.12 kg/(m^2).  General: WNWD WM, "Weathered Appearance" Appears in no acute distress. Neck: Supple. No thyromegaly. No lymphadenopathy. No carotid bruits. Lungs: Very slight wheeze heard at right lower lung. Remainder clear. But distant BS, decreased air movement throughout.  Heart: RRR with S1 S2. No murmurs, rubs, or gallops. Abdomen: Soft, non-tender, non-distended with normoactive bowel sounds. No hepatomegaly. No rebound/guarding. No obvious abdominal masses. Musculoskeletal:  Strength and tone normal for age. Extremities/Skin: Warm and dry. No edema.  Neuro: Alert and oriented X 3. Moves all extremities spontaneously. Gait is normal. CNII-XII grossly in tact. Psych:  Responds to questions appropriately with a normal affect. Diabetic foot exam: Inspection is normal. No wounds or concerning lesions. Sensation is intact. On the right: 2+ dorsalis pedis 2+ PT. On the left 1+ DP. 2+ PT.      ASSESSMENT AND PLAN:  81 y.o. year old male with    AT [facility] 12/15/15---Only discussed Diabetes.  To Continue Insulin at 55 UNITS QHS--CAN WAIT 3 months for f/u OV. BRING BS METER TO NEXT OV   THE FOLLOWING IS COPIED FROM LAST FULL OFFICE VISIT-----  12/01/2015: 1. History of repeated noncompliance in the past secondary to finances.  In the past he frequently could not afford all of his oral diabetic medications. In the past has also reported that he is unable to afford his Spiriva and Symbicort. We have been providing him with samples of both Spiriva and Symbicort. As well, we have been providing him with samples of Lantus or Levemir as often as possible. At his visit with me 03/12/14 I had [name] to contact [employer] to see whether they can help provide some assistance to him. [facility] follow-up with him but he did not want their assistance --01/2015--[facility] has reached out to pt again.  Is now on Neb meds At [place] 05/27/2015----I asked pt about "[facility]" and he says "no"--nobody coming to his house, nobody assisting him with his treatment-- I then reviewed further---[facility] Note 03/22/2015---documents that pt does not want further f/u from them--"does not see the need"  At [facility] 12/01/2015--Reports not currently using Pulmonary meds sec to cost---At that OV I gave him 2 samples of Advair to use for now.   2. history of smoking. He quit smoking January 2014. Congratulations and staying off of the cigarettes!!  3. COPD At [facility] 08/30/15--will add low dose prednisone---do not want BS to increase furhter but do not want him to develop COPD exacerbation Status post ER visit for COPD  on 11/04/13 Another ER visit for COPD on 11/06/13 Status post hospitalization for COPD exacerbation 11/15/13 See HPI regarding Spiriva and Symbicort.  He is on now on Pulmicort nebulizer (budesonide) He is now on Stiolto Respimat (tiotropium bromide) He has oxygen nasal cannula at home but is not using it regularly.  Diabetes  At [place] 12/15/2015---Instructed him to [place]. Can wait 3 months for f/u OV. Bring BS meter.   - Hemoglobin A1c  Microalbumin 05/27/2015 I have discussed need for routine eye exams and dental exams but he hasn't been able  to followup with the secondary to finances. On ARB. (H/O hyperkalemia with ACE Inh) On statin. On ASA 81mg .   2. Hypertension At Lab 07/23/13 his potassium was even higher. It had been borderline high prior to this. At that time his ACE inhibitor was stopped and he was started on ARB. BP hgih at [place] 05/27/2015----will monitor prior to adjusting meds---pt says "it is b/c he has been running ragged b/c of his wife being in the hospital" BP good at OV 08/30/15  3. Elevated lipids He is on Pravastatin 80mg . FLP excellent 02/25/2015 with LDL 68. LFTs were normal.  5. Pulmonary fibrosis Managed by Pulmonary  6. Elevated PSA He had elevated PSA at [facility] 07/2012. Also prior to this PSA was elevated. He has been informed this could be secondary to prostate cancer but refused followup with urology. He still refuses to recheck PSA. He is aware that he could have prostate cancer but does not want further evaluation or followup with his AT ALL.  7. Colon polyps History of Hemoccult-positive stool x3 in 07/2008. Refused GI evaluation despite being informed of risk of severe GI bleed, cancer, et Ronney Asters. H./H. normal 03/2010 and 01/2011. Refuses colonoscopy.  8. Immunizations: We have discussed influenza vaccine last flu season and he is deferred. Pneumonia vaccine: He received Pneumovax June 2003.  Received  Prevnar 13  10/23/2013 .  Tetanus: Gave in September 2005. Not covered by Medicare --therefore has deferred f/u sec to cost.  Zostavax: He has had so much problem with compliance, I have not even discussed this with pt!!  F/U OV 2 weeks.    Signed, [address] [place], [place], [facility] 12/15/2015 9:57 AM

## 2015-12-17 DIAGNOSIS — J449 Chronic obstructive pulmonary disease, unspecified: Secondary | ICD-10-CM | POA: Diagnosis not present

## 2015-12-27 ENCOUNTER — Other Ambulatory Visit: Payer: Self-pay | Admitting: Physician Assistant

## 2015-12-28 NOTE — Telephone Encounter (Signed)
Medication refilled per protocol. 

## 2016-01-10 DIAGNOSIS — R0600 Dyspnea, unspecified: Secondary | ICD-10-CM | POA: Diagnosis not present

## 2016-01-10 DIAGNOSIS — J40 Bronchitis, not specified as acute or chronic: Secondary | ICD-10-CM | POA: Diagnosis not present

## 2016-01-10 DIAGNOSIS — J8410 Pulmonary fibrosis, unspecified: Secondary | ICD-10-CM | POA: Diagnosis not present

## 2016-01-10 DIAGNOSIS — J441 Chronic obstructive pulmonary disease with (acute) exacerbation: Secondary | ICD-10-CM | POA: Diagnosis not present

## 2016-01-14 DIAGNOSIS — J441 Chronic obstructive pulmonary disease with (acute) exacerbation: Secondary | ICD-10-CM | POA: Diagnosis not present

## 2016-01-17 DIAGNOSIS — J449 Chronic obstructive pulmonary disease, unspecified: Secondary | ICD-10-CM | POA: Diagnosis not present

## 2016-01-21 ENCOUNTER — Other Ambulatory Visit: Payer: Self-pay | Admitting: Physician Assistant

## 2016-01-24 NOTE — Telephone Encounter (Signed)
Test strips refilled

## 2016-02-08 DIAGNOSIS — J441 Chronic obstructive pulmonary disease with (acute) exacerbation: Secondary | ICD-10-CM | POA: Diagnosis not present

## 2016-02-13 ENCOUNTER — Emergency Department (HOSPITAL_COMMUNITY): Payer: Medicare Other

## 2016-02-13 ENCOUNTER — Emergency Department (HOSPITAL_COMMUNITY)
Admission: EM | Admit: 2016-02-13 | Discharge: 2016-02-13 | Disposition: A | Discharge status: Home / Self Care | Payer: Medicare Other | Attending: Emergency Medicine | Admitting: Emergency Medicine

## 2016-02-13 ENCOUNTER — Encounter (HOSPITAL_COMMUNITY): Payer: Self-pay

## 2016-02-13 DIAGNOSIS — J449 Chronic obstructive pulmonary disease, unspecified: Secondary | ICD-10-CM | POA: Insufficient documentation

## 2016-02-13 DIAGNOSIS — Z79899 Other long term (current) drug therapy: Secondary | ICD-10-CM | POA: Insufficient documentation

## 2016-02-13 DIAGNOSIS — Z794 Long term (current) use of insulin: Secondary | ICD-10-CM | POA: Diagnosis not present

## 2016-02-13 DIAGNOSIS — E119 Type 2 diabetes mellitus without complications: Secondary | ICD-10-CM | POA: Diagnosis not present

## 2016-02-13 DIAGNOSIS — Z87891 Personal history of nicotine dependence: Secondary | ICD-10-CM | POA: Insufficient documentation

## 2016-02-13 DIAGNOSIS — R0602 Shortness of breath: Secondary | ICD-10-CM | POA: Diagnosis not present

## 2016-02-13 DIAGNOSIS — I10 Essential (primary) hypertension: Secondary | ICD-10-CM | POA: Diagnosis not present

## 2016-02-13 DIAGNOSIS — Z7982 Long term (current) use of aspirin: Secondary | ICD-10-CM | POA: Insufficient documentation

## 2016-02-13 DIAGNOSIS — Z7984 Long term (current) use of oral hypoglycemic drugs: Secondary | ICD-10-CM | POA: Diagnosis not present

## 2016-02-13 DIAGNOSIS — J441 Chronic obstructive pulmonary disease with (acute) exacerbation: Secondary | ICD-10-CM | POA: Insufficient documentation

## 2016-02-13 LAB — CBC WITH DIFFERENTIAL/PLATELET
Basophils Absolute: 0 10*3/uL (ref 0.0–0.1)
Basophils Relative: 1 %
EOS ABS: 0.5 10*3/uL (ref 0.0–0.7)
EOS PCT: 6 %
HCT: 40.3 % (ref 39.0–52.0)
Hemoglobin: 12.5 g/dL — ABNORMAL LOW (ref 13.0–17.0)
LYMPHS ABS: 1.6 10*3/uL (ref 0.7–4.0)
LYMPHS PCT: 20 %
MCH: 24.6 pg — AB (ref 26.0–34.0)
MCHC: 31 g/dL (ref 30.0–36.0)
MCV: 79.2 fL (ref 78.0–100.0)
MONOS PCT: 6 %
Monocytes Absolute: 0.5 10*3/uL (ref 0.1–1.0)
Neutro Abs: 5.4 10*3/uL (ref 1.7–7.7)
Neutrophils Relative %: 67 %
PLATELETS: 353 10*3/uL (ref 150–400)
RBC: 5.09 MIL/uL (ref 4.22–5.81)
RDW: 15.2 % (ref 11.5–15.5)
WBC: 8 10*3/uL (ref 4.0–10.5)

## 2016-02-13 LAB — BASIC METABOLIC PANEL
Anion gap: 6 (ref 5–15)
BUN: 21 mg/dL — ABNORMAL HIGH (ref 6–20)
CHLORIDE: 92 mmol/L — AB (ref 101–111)
CO2: 30 mmol/L (ref 22–32)
Calcium: 9.2 mg/dL (ref 8.9–10.3)
Creatinine, Ser: 0.99 mg/dL (ref 0.61–1.24)
GFR calc Af Amer: >60 mL/min (ref >60)
GLUCOSE: 353 mg/dL — AB (ref 65–99)
POTASSIUM: 4.7 mmol/L (ref 3.5–5.1)
SODIUM: 128 mmol/L — AB (ref 135–145)

## 2016-02-13 MED ORDER — DOXYCYCLINE HYCLATE 100 MG PO CAPS: 100.0000 mg | ORAL_CAPSULE | Freq: Two times a day (BID) | ORAL | Status: DC

## 2016-02-13 MED ORDER — IPRATROPIUM-ALBUTEROL 0.5-2.5 (3) MG/3ML IN SOLN
3.0000 mL | Freq: Once | RESPIRATORY_TRACT | Status: AC
  Administered 2016-02-13: 3 mL via RESPIRATORY_TRACT
  Filled 2016-02-13: qty 3

## 2016-02-13 MED ORDER — DOXYCYCLINE HYCLATE 100 MG PO TABS
100.0000 mg | ORAL_TABLET | Freq: Once | ORAL | Status: AC
  Administered 2016-02-13: 100 mg via ORAL
  Filled 2016-02-13: qty 1

## 2016-02-13 MED ORDER — PREDNISONE 10 MG PO TABS: 20.0000 mg | ORAL_TABLET | Freq: Every day | ORAL | Status: DC

## 2016-02-13 MED ORDER — METHYLPREDNISOLONE SODIUM SUCC 125 MG IJ SOLR
125.0000 mg | Freq: Once | INTRAMUSCULAR | Status: AC
  Administered 2016-02-13: 125 mg via INTRAVENOUS
  Filled 2016-02-13: qty 2

## 2016-02-13 NOTE — Discharge Instructions (Signed)
Prescription for prednisone and antibiotic. Continue your home nebulizer machine. Use oxygen as necessary. Return here if worse or follow-up with your primary care doctor

## 2016-02-13 NOTE — ED Provider Notes (Signed)
CSN: [id number]     Arrival date & time 02/13/16  0910 History  By signing my name below, I, Rayna Sexton, attest that this documentation has been prepared under the direction and in the presence of Nat Christen, MD. Electronically Signed: Rayna Sexton, ED Scribe. 02/13/2016. 9:24 AM.   Chief Complaint  Patient presents with  . Shortness of Breath   The history is provided by the patient and the spouse. No language interpreter was used.    HPI Comments: Edward [name] is a 81 y.o. male with a PMHx of COPD, bronchitis and chronic respiratory failure on home O2 PRN who presents to the Emergency Department complaining of waxing and waning, moderate, SOB x 1 month that began worsening this morning. He reports associated moderate wheezing. His wife states that he has been seen many times in the past for SOB exacerbations but denies his current SOB is worse than prior exacerbations. He is not currently taking steroid medications. He confirms his listed PCP and denies having a pulmonologist. Pt denies cough, fever, pain or any other associated symptoms at this time.    Past Medical History  Diagnosis Date  . Diabetes mellitus   . COPD (chronic obstructive pulmonary disease) ([place])   . Hypertension   . Allergy     Rhinitis  . Elevated lipids   . Pulmonary fibrosis ([place])   . Bronchitis   . Colon polyps   . Vitamin D deficiency   . PSA elevation   . Chronic respiratory failure ([place])   . Hypercholesterolemia   . On home O2     2L N/C    Past Surgical History  Procedure Laterality Date  . Cataract extraction w/phaco  06/25/2012    Procedure: CATARACT EXTRACTION PHACO AND INTRAOCULAR LENS PLACEMENT (IOC);  Surgeon: Elta Guadeloupe T. Gershon Crane, MD;  Location: [facility];  Service: Ophthalmology;  Laterality: Left;  CDE=19.01  . Cataract extraction w/phaco  07/09/2012    Procedure: CATARACT EXTRACTION PHACO AND INTRAOCULAR LENS PLACEMENT (IOC);  Surgeon: Elta Guadeloupe T. Gershon Crane, MD;  Location: [facility];  Service:  Ophthalmology;  Laterality: Right;  CDE: 20.09   Family History  Problem Relation Age of Onset  . CAD Other   . Diabetes Other   . Heart disease Mother    Social History  Substance Use Topics  . Smoking status: Former Smoker -- 1.50 packs/day for 60 years    Types: Cigarettes    Quit date: 12/31/2012  . Smokeless tobacco: Never Used  . Alcohol Use: No    Review of Systems  Constitutional: Negative for fever.  Respiratory: Positive for shortness of breath and wheezing. Negative for cough.   Musculoskeletal: Negative for myalgias.  All other systems reviewed and are negative.   Allergies  Ace inhibitors  Home Medications   Prior to Admission medications   Medication Sig Start Date End Date Taking? Authorizing Provider  albuterol (PROVENTIL) (2.5 MG/3ML) 0.083% nebulizer solution INHALE 1 VIAL VIA NEBULIZER EVERY 6 HOURS AS NEEDED FOR WHEEZING OR SHORTNESS OF BREATH 12/28/15  Yes Orlena Sheldon, PA-C  aspirin 81 MG EC tablet TAKE 1 TABLET BY MOUTH ONCE DAILY 08/19/15  Yes Lonie Peak Dixon, PA-C  budesonide (PULMICORT) 0.5 MG/2ML nebulizer solution Take 2 mLs (0.5 mg total) by nebulization 2 (two) times daily. 11/19/15  Yes Mary B Dixon, PA-C  Insulin Detemir (LEVEMIR FLEXTOUCH) 100 UNIT/ML Pen 55 units at bedtime Patient taking differently: Inject 55 Units into the skin at bedtime.  12/08/15  Yes Mary B  Dixon, PA-C  losartan (COZAAR) 100 MG tablet TAKE 1 TABLET BY MOUTH ONCE DAILY 12/28/15  Yes Orlena Sheldon, PA-C  metFORMIN (GLUCOPHAGE) 1000 MG tablet TAKE 1 TABLET BY MOUTH 2 TIMES A DAY 12/28/15  Yes Orlena Sheldon, PA-C  Omega-3 Fatty Acids (FISH OIL) 1000 MG CAPS Take 1 capsule by mouth 2 (two) times daily.    Yes Historical Provider, MD  pravastatin (PRAVACHOL) 80 MG tablet TAKE 1 TABLET BY MOUTH ONCE DAILY AT BEDTIME 12/28/15  Yes Mary B Dixon, PA-C  PROAIR RESPICLICK 123XX123 (90 BASE) MCG/ACT AEPB INHALE 2 PUFFS INTO LUNGS EVERY 6 HOURS AS NEEDED FOR WHEEZING OR SHORTNESS OF BREATH 06/18/15   Yes Orlena Sheldon, PA-C  Tiotropium Bromide-Olodaterol (STIOLTO RESPIMAT) 2.5-2.5 MCG/ACT AERS Inhale 2 Inhalers into the lungs daily. 12/29/14  Yes Susy Frizzle, MD  doxycycline (VIBRAMYCIN) 100 MG capsule Take 1 capsule (100 mg total) by mouth 2 (two) times daily. 02/13/16   Nat Christen, MD  EASY TOUCH PEN NEEDLES 31G X 8 MM MISC USE 1 PEN NEEDLE ONCE DAILY AS DIRECTED 08/19/15   Orlena Sheldon, PA-C  glucose blood (ONE TOUCH ULTRA TEST) test strip 1 each by Other route 2 (two) times daily. Check blood sugar each morning and then 2hrs after any meal. 11/01/15   Orlena Sheldon, PA-C  ONE TOUCH ULTRA TEST test strip CHECK FASTING BLOOD SUGAR TWICE DAILY 01/24/16   Orlena Sheldon, PA-C  ONETOUCH DELICA LANCETS 99991111 MISC USE TO CHECK BLOOD SUGAR TWICE DAILY AS DIRECTED 08/19/15   Orlena Sheldon, PA-C  predniSONE (DELTASONE) 10 MG tablet Take 2 tablets (20 mg total) by mouth daily. 02/13/16   Nat Christen, MD   BP 119/68 mmHg  Pulse 91  Resp 20  SpO2 95%    Physical Exam  Constitutional: He is oriented to person, place, and time. He appears well-developed and well-nourished.  HENT:  Head: Normocephalic and atraumatic.  Eyes: Conjunctivae and EOM are normal. Pupils are equal, round, and reactive to light.  Neck: Normal range of motion. Neck supple.  Cardiovascular: Normal rate and regular rhythm.   Pulmonary/Chest: Effort normal. He has wheezes.  Abdominal: Soft. Bowel sounds are normal.  Musculoskeletal: Normal range of motion.  Neurological: He is alert and oriented to person, place, and time.  Skin: Skin is warm and dry.  Psychiatric: He has a normal mood and affect. His behavior is normal.  Nursing note and vitals reviewed.   ED Course  Procedures  COORDINATION OF CARE: 9:22 AM Discussed next steps with pt. He verbalized understanding and is agreeable with the plan.   Labs Review Labs Reviewed  CBC WITH DIFFERENTIAL/PLATELET - Abnormal; Notable for the following:    Hemoglobin 12.5 (*)     MCH 24.6 (*)    All other components within normal limits  BASIC METABOLIC PANEL - Abnormal; Notable for the following:    Sodium 128 (*)    Chloride 92 (*)    Glucose, Bld 353 (*)    BUN 21 (*)    All other components within normal limits    Imaging Review Dg Chest Portable 1 View  02/13/2016  CLINICAL DATA:  Shortness of breath 1 month worsening today. EXAM: PORTABLE CHEST 1 VIEW COMPARISON:  12/29/2014 FINDINGS: Lungs are adequately inflated without consolidation or effusion. Cardiomediastinal silhouette is within normal. There is mild calcified plaque over the aortic arch. There are mild degenerative changes of the spine. IMPRESSION: No active disease. Electronically Signed  By: Marin Olp M.D.   On: 02/13/2016 09:56   I have personally reviewed and evaluated these images and lab results as part of my medical decision-making.   EKG Interpretation   Date/Time:  Sunday Feb 13 2016 09:17:02 EDT Ventricular Rate:  112 PR Interval:  141 QRS Duration: 77 QT Interval:  312 QTC Calculation: 426 R Axis:   71 Text Interpretation:  Sinus tachycardia Anteroseptal infarct, old  Borderline T abnormalities, lateral leads Confirmed by Annabella Elford  MD, Clarita Mcelvain  [phone]) on 02/13/2016 11:24:50 AM      MDM   Final diagnoses:  COPD exacerbation ([place])    Patient feels much better after nebulizer treatments and IV steroids. Chest x-ray shows no pneumonia. Patient is immunocompromised. Will Rx prednisone and doxycycline 100 mg.  I personally performed the services described in this documentation, which was scribed in my presence. The recorded information has been reviewed and is accurate.     Nat Christen, MD 02/13/16 1212

## 2016-02-13 NOTE — ED Notes (Signed)
Pt on 65% RA when undergoing triage. Pt placed on NRB at 12L. MD at bedside requesting pt be placed on 4 L.

## 2016-02-13 NOTE — ED Notes (Signed)
Pt reports sob x 1 month, worse today.  Denies cough, fever, or pain.

## 2016-02-13 NOTE — ED Notes (Signed)
RT at bedside.

## 2016-02-16 DIAGNOSIS — J449 Chronic obstructive pulmonary disease, unspecified: Secondary | ICD-10-CM | POA: Diagnosis not present

## 2016-03-03 DIAGNOSIS — J441 Chronic obstructive pulmonary disease with (acute) exacerbation: Secondary | ICD-10-CM | POA: Diagnosis not present

## 2016-03-13 ENCOUNTER — Telehealth: Payer: Self-pay | Admitting: Physician Assistant

## 2016-03-13 MED ORDER — LOSARTAN POTASSIUM 100 MG PO TABS: 100.0000 mg | ORAL_TABLET | Freq: Every day | ORAL | Status: DC

## 2016-03-13 MED ORDER — METFORMIN HCL 1000 MG PO TABS: 1000.0000 mg | ORAL_TABLET | Freq: Two times a day (BID) | ORAL | Status: DC

## 2016-03-13 MED ORDER — PRAVASTATIN SODIUM 80 MG PO TABS: 80.0000 mg | ORAL_TABLET | Freq: Every day | ORAL | Status: DC

## 2016-03-13 NOTE — Telephone Encounter (Signed)
Medication refilled per protocol. 

## 2016-03-13 NOTE — Telephone Encounter (Signed)
Son calling in requesting a refill request for pravastatin (PRAVACHOL) 80 MG tablet ,losartan (COZAAR) 100 MG tablet and  metFORMIN (GLUCOPHAGE) 1000 MG tablet      he uses [facility]  [phone]

## 2016-03-16 ENCOUNTER — Ambulatory Visit (INDEPENDENT_AMBULATORY_CARE_PROVIDER_SITE_OTHER): Payer: Medicare Other | Admitting: Physician Assistant

## 2016-03-16 ENCOUNTER — Ambulatory Visit: Payer: Medicare Other | Admitting: Physician Assistant

## 2016-03-16 ENCOUNTER — Ambulatory Visit
Admission: RE | Admit: 2016-03-16 | Discharge: 2016-03-16 | Disposition: A | Discharge status: Home / Self Care | Payer: Medicare Other | Source: Ambulatory Visit | Attending: Physician Assistant | Admitting: Physician Assistant

## 2016-03-16 ENCOUNTER — Encounter: Payer: Self-pay | Admitting: Physician Assistant

## 2016-03-16 VITALS — BP 164/88 | HR 96 | Temp 98.0°F | Resp 18 | Wt 157.0 lb

## 2016-03-16 DIAGNOSIS — R140 Abdominal distension (gaseous): Secondary | ICD-10-CM

## 2016-03-16 DIAGNOSIS — K5900 Constipation, unspecified: Secondary | ICD-10-CM

## 2016-03-16 NOTE — Progress Notes (Signed)
Patient ID: Edward [name] MRN: [id number], DOB: 06/05/1935, 81 y.o. Date of Encounter: @DATE @  Chief Complaint:  Chief Complaint  Patient presents with  . check up after ED  . routine check up    abd distended, trouble with BM's    HPI: 81 y.o. year old male  presents with above.   His ED visit was a monthago for COPD.   His last ROV was 3 months ago.  However, he is having other issues today so will address abdomen issue today and he will schedule f/u OV 1 week to address other issues.   Says that for past 3-4 weeks has been getting out very little stool compared to usual. Says what comes out is soft. Says he "has always had some trouble with constipation off and on" but worse past 3-4 weeks and "now look like pregnant woman".  No abdominal pain.  No fever/chills.    Past Medical History  Diagnosis Date  . Diabetes mellitus   . COPD (chronic obstructive pulmonary disease) ([place])   . Hypertension   . Allergy     Rhinitis  . Elevated lipids   . Pulmonary fibrosis ([place])   . Bronchitis   . Colon polyps   . Vitamin D deficiency   . PSA elevation   . Chronic respiratory failure ([place])   . Hypercholesterolemia   . On home O2     2L N/C      Home Meds: Outpatient Prescriptions Prior to Visit  Medication Sig Dispense Refill  . albuterol (PROVENTIL) (2.5 MG/3ML) 0.083% nebulizer solution INHALE 1 VIAL VIA NEBULIZER EVERY 6 HOURS AS NEEDED FOR WHEEZING OR SHORTNESS OF BREATH 360 mL 5  . aspirin 81 MG EC tablet TAKE 1 TABLET BY MOUTH ONCE DAILY 30 tablet 3  . budesonide (PULMICORT) 0.5 MG/2ML nebulizer solution Take 2 mLs (0.5 mg total) by nebulization 2 (two) times daily. 120 mL 5  . doxycycline (VIBRAMYCIN) 100 MG capsule Take 1 capsule (100 mg total) by mouth 2 (two) times daily. 20 capsule 0  . EASY TOUCH PEN NEEDLES 31G X 8 MM MISC USE 1 PEN NEEDLE ONCE DAILY AS DIRECTED 100 each 3  . glucose blood (ONE TOUCH ULTRA TEST) test strip 1 each by Other route 2 (two) times  daily. Check blood sugar each morning and then 2hrs after any meal. 100 each 11  . Insulin Detemir (LEVEMIR FLEXTOUCH) 100 UNIT/ML Pen 55 units at bedtime (Patient taking differently: Inject 55 Units into the skin at bedtime. ) 15 mL 11  . losartan (COZAAR) 100 MG tablet Take 1 tablet (100 mg total) by mouth daily. 30 tablet 5  . metFORMIN (GLUCOPHAGE) 1000 MG tablet Take 1 tablet (1,000 mg total) by mouth 2 (two) times daily. 60 tablet 5  . Omega-3 Fatty Acids (FISH OIL) 1000 MG CAPS Take 1 capsule by mouth 2 (two) times daily.     . ONE TOUCH ULTRA TEST test strip CHECK FASTING BLOOD SUGAR TWICE DAILY 100 each 5  . ONETOUCH DELICA LANCETS 99991111 MISC USE TO CHECK BLOOD SUGAR TWICE DAILY AS DIRECTED 100 each 3  . pravastatin (PRAVACHOL) 80 MG tablet Take 1 tablet (80 mg total) by mouth at bedtime. 30 tablet 5  . predniSONE (DELTASONE) 10 MG tablet Take 2 tablets (20 mg total) by mouth daily. 15 tablet 0  . PROAIR RESPICLICK 123XX123 (90 BASE) MCG/ACT AEPB INHALE 2 PUFFS INTO LUNGS EVERY 6 HOURS AS NEEDED FOR WHEEZING OR SHORTNESS OF BREATH  1 each 5  . Tiotropium Bromide-Olodaterol (STIOLTO RESPIMAT) 2.5-2.5 MCG/ACT AERS Inhale 2 Inhalers into the lungs daily. 1 Inhaler 5   No facility-administered medications prior to visit.    Allergies:  Allergies  Allergen Reactions  . Ace Inhibitors     Hyperkalemia--07/23/2013:patient states not familiar with the following allergy    Social History   Social History  . Marital Status: Married    Spouse Name: N/A  . Number of Children: N/A  . Years of Education: N/A   Occupational History  . 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 Smoking status: Former Smoker -- 1.50 packs/day for 60 years    Types: Cigarettes    Quit date: 12/31/2012  . Smokeless tobacco: Never Used  . Alcohol Use: No  . Drug Use: No  . Sexual Activity: Yes    Birth Control/ Protection: None   Other Topics Concern  . Not on file   Social History  Narrative    Family History  Problem Relation Age of Onset  . CAD Other   . Diabetes Other   . Heart disease Mother      Review of Systems:  See HPI for pertinent ROS. All other ROS negative.    Physical Exam: Blood pressure 164/88, pulse 96, temperature 98 F (36.7 C), temperature source Oral, resp. rate 18, weight 157 lb (71.215 kg), SpO2 93 %., Body mass index is 26.13 kg/(m^2). General: WM. Appears in no acute distress. Neck: Supple. No thyromegaly. No lymphadenopathy. Lungs: Clear bilaterally to auscultation without wheezes, rales, or rhonchi. Breathing is unlabored. Heart: RRR with S1 S2. No murmurs, rubs, or gallops. Abdomen:Abdomen is distended. There is no localized/focal abdominal pain.  Musculoskeletal:  Strength and tone normal for age. Extremities/Skin: Warm and dry.  Neuro: Alert and oriented X 3. Moves all extremities spontaneously. Gait is normal. CNII-XII grossly in tact. Psych:  Responds to questions appropriately with a normal affect.     ASSESSMENT AND PLAN:  81 y.o. year old male with  1. Abdominal distention He is to go for XRay Abdomen now. I will f/u with him when I get this report.  He will schedule f/u Ov for 1 week to manage chronic medical problems. - DG Abd 2 Views; Future  2. Constipation, unspecified constipation type - DG Abd 2 Views; Future   Signed, Olean Ree [place], [place], [facility] 03/16/2016 9:58 AM

## 2016-03-18 DIAGNOSIS — J449 Chronic obstructive pulmonary disease, unspecified: Secondary | ICD-10-CM | POA: Diagnosis not present

## 2016-03-21 ENCOUNTER — Other Ambulatory Visit: Payer: Self-pay | Admitting: Family Medicine

## 2016-03-21 MED ORDER — LUBIPROSTONE 24 MCG PO CAPS: 24.0000 ug | ORAL_CAPSULE | Freq: Two times a day (BID) | ORAL | Status: DC

## 2016-03-23 ENCOUNTER — Encounter: Payer: Self-pay | Admitting: Physician Assistant

## 2016-03-23 ENCOUNTER — Ambulatory Visit (INDEPENDENT_AMBULATORY_CARE_PROVIDER_SITE_OTHER): Payer: Medicare Other | Admitting: Physician Assistant

## 2016-03-23 VITALS — BP 130/70 | HR 88 | Temp 98.7°F | Resp 18 | Ht 65.0 in | Wt 160.0 lb

## 2016-03-23 DIAGNOSIS — E1165 Type 2 diabetes mellitus with hyperglycemia: Secondary | ICD-10-CM | POA: Diagnosis not present

## 2016-03-23 DIAGNOSIS — Z794 Long term (current) use of insulin: Secondary | ICD-10-CM | POA: Diagnosis not present

## 2016-03-23 DIAGNOSIS — J438 Other emphysema: Secondary | ICD-10-CM | POA: Diagnosis not present

## 2016-03-23 DIAGNOSIS — J8410 Pulmonary fibrosis, unspecified: Secondary | ICD-10-CM

## 2016-03-23 DIAGNOSIS — I10 Essential (primary) hypertension: Secondary | ICD-10-CM | POA: Diagnosis not present

## 2016-03-23 DIAGNOSIS — E785 Hyperlipidemia, unspecified: Secondary | ICD-10-CM

## 2016-03-23 DIAGNOSIS — K219 Gastro-esophageal reflux disease without esophagitis: Secondary | ICD-10-CM | POA: Diagnosis not present

## 2016-03-23 LAB — COMPLETE METABOLIC PANEL WITH GFR
ALT: 11 U/L (ref 9–46)
AST: 13 U/L (ref 10–35)
Albumin: 3.7 g/dL (ref 3.6–5.1)
Alkaline Phosphatase: 84 U/L (ref 40–115)
BUN: 16 mg/dL (ref 7–25)
CO2: 29 mmol/L (ref 20–31)
Calcium: 9.6 mg/dL (ref 8.6–10.3)
Chloride: 95 mmol/L — ABNORMAL LOW (ref 98–110)
Creat: 1.05 mg/dL (ref 0.70–1.18)
GFR, Est African American: 78 mL/min (ref >=60)
GFR, Est Non African American: 68 mL/min (ref >=60)
Glucose, Bld: 248 mg/dL — ABNORMAL HIGH (ref 70–99)
Potassium: 5.8 mmol/L — ABNORMAL HIGH (ref 3.5–5.3)
Sodium: 134 mmol/L — ABNORMAL LOW (ref 135–146)
Total Bilirubin: 0.3 mg/dL (ref 0.2–1.2)
Total Protein: 6.4 g/dL (ref 6.1–8.1)

## 2016-03-23 LAB — HEMOGLOBIN A1C
Hgb A1c MFr Bld: 13.7 % — ABNORMAL HIGH (ref <5.7)
Mean Plasma Glucose: 346 mg/dL

## 2016-03-23 MED ORDER — OMEPRAZOLE 20 MG PO CPDR: 20.0000 mg | DELAYED_RELEASE_CAPSULE | Freq: Every day | ORAL | Status: DC

## 2016-03-23 MED ORDER — INSULIN DETEMIR 100 UNIT/ML FLEXPEN
PEN_INJECTOR | SUBCUTANEOUS | Status: DC
Start: 2016-03-23 — End: 2016-03-30

## 2016-03-23 NOTE — Progress Notes (Signed)
Patient ID: Edward [name] MRN: [id number], DOB: 1935-01-25, 81 y.o. Date of Encounter: @DATE @  Chief Complaint:  Chief Complaint  Patient presents with  . OTHER    f/u    HPI: 81 y.o. year old male  presents for routine followup office visit.    At office visit with me 10/30/13 at which time we stopped multiple oral medicines and started Lantus. On 11/04/13 he had an ER visit with COPD. On 11/06/13 he had another ER visit with COPD. 11/10/2013 he had office visit with me to followup with COPD. 11/15/13 he was hospitalized with COPD. 12/01/13 have followup office visit with me regarding his COPD. 12/08/13 he had another followup office visit with me.  In the past I had placed him on Spiriva and Symbicort for his COPD. Repeatedly, he was noncompliant with these secondary to finances. At office visits I gave him samples repeatedly.  He is currently on budesonide Pulmicort nebulizer. Also currently on tiotropium bromide--Stiolto Respimat. Also nasal cannula oxygen. Also albuterol nebulizer.  Also in the past we had [facility] evaluate and visit him but then he was not compliant with following up with them and letting them help him.  We recently did contact them again and I do see that they have entered recent notes starting on 01/25/15 up through 02/09/15.  At visit 02/25/15 he states that he is "all choked up" says that he has phlegm in his chest and throat. In using his nebulizer a lot for 5 times a day and at night. Says that he has oxygen at home but he just uses that one or 2 times per day as needed and sometimes at night.  At visit 02/25/15 he did bring in blood sugar log sheet. He does have documented a fasting reading for every single morning. No other readings at any other times of day. For the month of May all of his readings are ranging 121-200. He only has 3 readings above 200. May 12 was 230. May 14 was 206. May 26-220 For the month of April fasting readings all range from 109 to 215.  However most of these readings are around 130. For the month of March, fasting readings range mostly from 143 to around 170.   At [place] 05/27/2015-- He reports "I guess you saw I was in the hospital"---05/09/15--Preseptal Cellulitis.  -----I asked today if he had f/u with Ophthalmolgist and he says he did not---b/c that's when his wife fell. Says his wife has been in the hospital for 2 weeks. Says she is now in [place] at [employer]. Says "all that running back and forth with her has run me ragged"  I see notes in Epic from"[facility]"---I asked pt if any nurses/staff have come to his house--and he says "no" and acts like he has seen/heard nothing of this.-- I then reviewed further---[facility] Note 03/22/2015---documents that pt does not want further f/u from them--"does not see the need" He also brings in BS readings.  I reviewed my last set of labs---that result note said to start documenting 2 hour post prandial BS readings.  However, readings today are all fasting but are documented for each/every day.  For July: 130s, 160s, also 200, 253, 220, 268 For August: 140-150 mostly, but also 170, 211, 212, 181. One reading of 278---but only one this high.  At [place] 08/30/2015: Reviewed that last A1C 05/27/15 was >14.  He says that at that time he did increase Levemir from 20 units QHS to 25  units QHS.  Brings BS log: Only has Fasting readings: FOr every morning : Some 131, 121, 134, 111,  Most are 150-160 range. Highest: 200 and 284 but these are the only 2 high readings like this.   Says he has cough at night that keeps him awake--says some nights not so bad, but some nights, cough when lay down. Uses Albuterol multiple times in the night on those nights.  Says he is using all Pulm meds as directed and that they have not run out.   His last Routine OV was 08/30/2015, but after that, he had f/u OVs to f/u glucose and adjust Insulin.  At visit 08/30/15 A1c was 14 that he was told to increase Levemir from  25-30 units in follow-up 2 weeks. He had visit 09/15/15--- that time he had increased and was at Levemir 30 units. At that visit had him increase further to 35 units. He then had follow-up visit 09/20/15--at time he reported that he was up to 35 units. At that visit increased further to 40 units.  12/01/2015: States that his insulin is at 40 units. He brings in blood sugar log sheet with fasting readings daily. These readings include: 278, 230, 197, 269, 218, 287, 241, 259, 245, 221, 193, 140, 197, 191, 190, 233, 323. He brought in no readings from any other time of day. These were all fasting. Also brings me a copy of print out that is by [place] that lists all of his medications and information including price. He tells me that he recently had to stop the [employer] and the albuterol because of cost. However the cost for the albuterol says just $3.85  and the [employer] says $8.10 .  He continues to stay off of his cigarettes. Says he hasn't smoked in over 2 years.  Reports that he is taking blood pressure medication as directed with no headedness. Reports that he is taking his pravastatin with no myalgias or other adverse effects.   At [place] 12/01/15-- Told him to increase Insulin to 45 units and return in 1 week for f/u OV--to bring BS meter and log sheet to that appt  At [place] 12/08/2015: He brings his BS meter.  He brings BS log sheet.  I reviewed the numbers in the meter to make sure that these were accurate and an documenting the readings from the meter: All of these are fasting readings : 3/8-----188 3/7---------- 187 3/6-------------- 305 3/5---------- 205 3/4---------- 269 3/3--------- 227 3/2--------- 277 3/1--------- 332  Did look at his blood sugar log sheet. He has different numbers written on there. Asked if his wife is diabetic or if anyone else is using his meter and he says no.  I really think that he is illiterate.  For safety purposes, need to go by  readings on his meter, not on his BS log sheet.   He promises that he is taking insulin daily and is currently using 45 units-----he tells me this dose on his own-- and this is correct-- this is the amount that I told him at last visit  At this time, will have him increase to 55 units and f/u in 1 week with fasting BS readings---ON HIS METER---NOT WRITTEN ON LOG SHEET-------------  AT OV 12/15/2015: He reports that he is now doing insulin 55 units. He did bring his blood sugar meter with him and this is what I have reviewed. Early morning readings: 12/08/15------------------ 188 12/09/15------------------ 207 12/10/15---------------- 158 12/11/15----------------- 119 12/12/15------------------ 114 12/13/15------------------  205 12/14/2015------------- 185 12/15/15------------------ 267.  All of these fasting readings are performed around 3 AM. He states that he always wakes up around 3 AM. Says that he then eats oatmeal or sometimes a boiled egg. He does not eat any lunch. Through the day he just has water or milk. Yesterday around 4 PM he ate pinto beans. Says that later after that he had some milk and a few Nilla Wafers. Says that this is pretty average/typical of his usual daily intake. I asked if part of the reason that he eats a little is because of finances and because this is all he has to eat but he says that that is not it and says that he just isn't hungry for anything more. Asked if he snacks throughout the day and he says that he does not. Asked if he usually eats something like Nilla wafers in the evening--and he says not all the time.   03/23/2016: Says was able to get med for constipation (Amitiza)--says it is working well ---(Had OV with me regarding this 03/16/2016) Says he has "heartburn"---asks if I cna give him some medicine to use for heartburn. Brings BS log and has meter also Is not filling in log correctly again at this time--CAN NOT GO BYY LOG--MUST LOOK AT  METER Readings from meter: All of these are taken at ~ 2: 00 a.m. 6/22--- 190 6/19---  302 6/18--  285 6/17--  227 6/16--  347 6/14--  397 6/10---  263 6/8--  316  Iasked how much Insulin he is giving--says " 55"  Which is correct. Says he gives this ~ 6 pm then watches TV then goes to bed ~ 8 pm.   Past Medical History  Diagnosis Date  . Diabetes mellitus   . COPD (chronic obstructive pulmonary disease) ([place])   . Hypertension   . Allergy     Rhinitis  . Elevated lipids   . Pulmonary fibrosis ([place])   . Bronchitis   . Colon polyps   . Vitamin D deficiency   . PSA elevation   . Chronic respiratory failure ([place])   . Hypercholesterolemia   . On home O2     2L N/C      Home Meds: Outpatient Prescriptions Prior to Visit  Medication Sig Dispense Refill  . albuterol (PROVENTIL) (2.5 MG/3ML) 0.083% nebulizer solution INHALE 1 VIAL VIA NEBULIZER EVERY 6 HOURS AS NEEDED FOR WHEEZING OR SHORTNESS OF BREATH 360 mL 5  . aspirin 81 MG EC tablet TAKE 1 TABLET BY MOUTH ONCE DAILY 30 tablet 3  . budesonide (PULMICORT) 0.5 MG/2ML nebulizer solution Take 2 mLs (0.5 mg total) by nebulization 2 (two) times daily. 120 mL 5  . EASY TOUCH PEN NEEDLES 31G X 8 MM MISC USE 1 PEN NEEDLE ONCE DAILY AS DIRECTED 100 each 3  . glucose blood (ONE TOUCH ULTRA TEST) test strip 1 each by Other route 2 (two) times daily. Check blood sugar each morning and then 2hrs after any meal. 100 each 11  . Insulin Detemir (LEVEMIR FLEXTOUCH) 100 UNIT/ML Pen 55 units at bedtime (Patient taking differently: Inject 55 Units into the skin at bedtime. ) 15 mL 11  . losartan (COZAAR) 100 MG tablet Take 1 tablet (100 mg total) by mouth daily. 30 tablet 5  . lubiprostone (AMITIZA) 24 MCG capsule Take 1 capsule (24 mcg total) by mouth 2 (two) times daily with a meal. 60 capsule 5  . metFORMIN (GLUCOPHAGE) 1000 MG tablet Take 1 tablet (1,000  mg total) by mouth 2 (two) times daily. 60 tablet 5  . Omega-3 Fatty Acids (FISH OIL)  1000 MG CAPS Take 1 capsule by mouth 2 (two) times daily.     . ONE TOUCH ULTRA TEST test strip CHECK FASTING BLOOD SUGAR TWICE DAILY 100 each 5  . ONETOUCH DELICA LANCETS 99991111 MISC USE TO CHECK BLOOD SUGAR TWICE DAILY AS DIRECTED 100 each 3  . pravastatin (PRAVACHOL) 80 MG tablet Take 1 tablet (80 mg total) by mouth at bedtime. 30 tablet 5  . PROAIR RESPICLICK 123XX123 (90 BASE) MCG/ACT AEPB INHALE 2 PUFFS INTO LUNGS EVERY 6 HOURS AS NEEDED FOR WHEEZING OR SHORTNESS OF BREATH 1 each 5  . Tiotropium Bromide-Olodaterol (STIOLTO RESPIMAT) 2.5-2.5 MCG/ACT AERS Inhale 2 Inhalers into the lungs daily. 1 Inhaler 5  . doxycycline (VIBRAMYCIN) 100 MG capsule Take 1 capsule (100 mg total) by mouth 2 (two) times daily. 20 capsule 0  . predniSONE (DELTASONE) 10 MG tablet Take 2 tablets (20 mg total) by mouth daily. 15 tablet 0   No facility-administered medications prior to visit.     Allergies:  Allergies  Allergen Reactions  . Ace Inhibitors     Hyperkalemia--07/23/2013:patient states not familiar with the following allergy    Social History   Social History  . Marital Status: Married    Spouse Name: N/A  . Number of Children: N/A  . Years of Education: N/A   Occupational History  . 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 Smoking status: Former Smoker -- 1.50 packs/day for 60 years    Types: Cigarettes    Quit date: 12/31/2012  . Smokeless tobacco: Never Used  . Alcohol Use: No  . Drug Use: No  . Sexual Activity: Yes    Birth Control/ Protection: None   Other Topics Concern  . Not on file   Social History Narrative    Family History  Problem Relation Age of Onset  . CAD Other   . Diabetes Other   . Heart disease Mother      Review of Systems:  See HPI for pertinent ROS. All other ROS negative.    Physical Exam: Blood pressure 130/70, pulse 88, temperature 98.7 F (37.1 C), temperature source Oral, resp. rate 18, height 5\' 5"  (1.651 m), weight 160 lb  (72.576 kg), SpO2 93 %., Body mass index is 26.63 kg/(m^2).  General: WNWD WM, "Weathered Appearance" Appears in no acute distress. Neck: Supple. No thyromegaly. No lymphadenopathy. No carotid bruits. Lungs: Very slight wheeze heard at right lower lung. Remainder clear. But distant BS, decreased air movement throughout.  Heart: RRR with S1 S2. No murmurs, rubs, or gallops. Abdomen: Soft, non-tender, non-distended with normoactive bowel sounds. No hepatomegaly. No rebound/guarding. No obvious abdominal masses. Musculoskeletal:  Strength and tone normal for age. Extremities/Skin: Warm and dry. No edema.  Neuro: Alert and oriented X 3. Moves all extremities spontaneously. Gait is normal. CNII-XII grossly in tact. Psych:  Responds to questions appropriately with a normal affect. Diabetic foot exam: Inspection is normal. No wounds or concerning lesions. Sensation is intact. On the right: 2+ dorsalis pedis 2+ PT. On the left 1+ DP. 2+ PT.      ASSESSMENT AND PLAN:  81 y.o. year old male with    AT [facility] 03/23/2016--  ----------Increase Insulin to 65 UNITS QHS--  F/U OV 1 WEEK------- BRING BS METER TO NEXT OV   1. History of repeated noncompliance in the past  secondary to finances.  In the past he frequently could not afford all of his oral diabetic medications. In the past has also reported that he is unable to afford his Spiriva and Symbicort. We have been providing him with samples of both Spiriva and Symbicort. As well, we have been providing him with samples of Lantus or Levemir as often as possible. At his visit with me 03/12/14 I had [name] to contact [employer] to see whether they can help provide some assistance to him. [facility] follow-up with him but he did not want their assistance --01/2015--[facility] has reached out to pt again.  Is now on Neb meds At [place] 05/27/2015----I asked pt about "[facility]" and he says "no"--nobody coming to his house, nobody assisting him with his  treatment-- I then reviewed further---[facility] Note 03/22/2015---documents that pt does not want further f/u from them--"does not see the need"  At [facility] 12/01/2015--Reports not currently using Pulmonary meds sec to cost---At that OV I gave him 2 samples of Advair to use for now.   2. history of smoking. He quit smoking January 2014. Congratulations and staying off of the cigarettes!!  3. COPD At OV 08/30/15--will add low dose prednisone---do not want BS to increase furhter but do not want him to develop COPD exacerbation Status post ER visit for COPD on 11/04/13 Another ER visit for COPD on 11/06/13 Status post hospitalization for COPD exacerbation 11/15/13 See HPI regarding Spiriva and Symbicort.  He is on now on Pulmicort nebulizer (budesonide) He is now on Stiolto Respimat (tiotropium bromide) He has oxygen nasal cannula at home but is not using it regularly.  Diabetes  At OV 03/23/2016---Instructed him to INCREASE INSULIN TO  55 UNITS QHS. F/U OV 1 WEEK. Bring BS meter.   - Hemoglobin A1c  Microalbumin 05/27/2015 I have discussed need for routine eye exams and dental exams but he hasn't been able to followup with the secondary to finances. On ARB. (H/O hyperkalemia with ACE Inh) On statin. On ASA 81mg .   2. Hypertension At Lab 07/23/13 his potassium was even higher. It had been borderline high prior to this. At that time his ACE inhibitor was stopped and he was started on ARB. BP hgih at [place] 05/27/2015----will monitor prior to adjusting meds---pt says "it is b/c he has been running ragged b/c of his wife being in the hospital" BP good at OV 08/30/15  3. Elevated lipids He is on Pravastatin 80mg . FLP excellent 02/25/2015 with LDL 68. LFTs were normal.  5. Pulmonary fibrosis Managed by Pulmonary  6. Elevated PSA He had elevated PSA at Lab 07/2012. Also prior to this PSA was elevated. He has been informed this could be secondary to prostate cancer but refused followup with urology. He still  refuses to recheck PSA. He is aware that he could have prostate cancer but does not want further evaluation or followup with his AT ALL.  7. Colon polyps History of Hemoccult-positive stool x3 in 07/2008. Refused GI evaluation despite being informed of risk of severe GI bleed, cancer, et Ronney Asters. H./H. normal 03/2010 and 01/2011. Refuses colonoscopy.  8. Immunizations: We have discussed influenza vaccine last flu season and he is deferred. Pneumonia vaccine: He received Pneumovax June 2003.  Received  Prevnar 13  10/23/2013 .  Tetanus: Gave in September 2005. Not covered by Medicare --therefore has deferred f/u sec to cost.  Zostavax: He has had so much problem with compliance, I have not even discussed this with pt!!  F/U OV 1  week.    SignedOlean Ree [place], [place], [facility] 03/23/2016 9:27 AM

## 2016-03-29 DIAGNOSIS — J441 Chronic obstructive pulmonary disease with (acute) exacerbation: Secondary | ICD-10-CM | POA: Diagnosis not present

## 2016-03-30 ENCOUNTER — Encounter: Payer: Self-pay | Admitting: Physician Assistant

## 2016-03-30 ENCOUNTER — Ambulatory Visit (INDEPENDENT_AMBULATORY_CARE_PROVIDER_SITE_OTHER): Payer: Medicare Other | Admitting: Physician Assistant

## 2016-03-30 VITALS — BP 142/68 | HR 74 | Temp 98.4°F | Resp 16 | Ht 65.0 in | Wt 160.0 lb

## 2016-03-30 DIAGNOSIS — Z794 Long term (current) use of insulin: Secondary | ICD-10-CM | POA: Diagnosis not present

## 2016-03-30 DIAGNOSIS — E1165 Type 2 diabetes mellitus with hyperglycemia: Secondary | ICD-10-CM | POA: Diagnosis not present

## 2016-03-30 MED ORDER — INSULIN DETEMIR 100 UNIT/ML FLEXPEN: PEN_INJECTOR | SUBCUTANEOUS | Status: DC

## 2016-03-30 NOTE — Progress Notes (Signed)
Patient ID: Edward [name] MRN: [id number], DOB: 01-23-1935, 81 y.o. Date of Encounter: @DATE @  Chief Complaint:  Chief Complaint  Patient presents with  . OTHER    follow up     HPI: 81 y.o. year old male  presents for routine followup office visit.    At office visit with me 10/30/13 at which time we stopped multiple oral medicines and started Lantus. On 11/04/13 he had an ER visit with COPD. On 11/06/13 he had another ER visit with COPD. 11/10/2013 he had office visit with me to followup with COPD. 11/15/13 he was hospitalized with COPD. 12/01/13 have followup office visit with me regarding his COPD. 12/08/13 he had another followup office visit with me.  In the past I had placed him on Spiriva and Symbicort for his COPD. Repeatedly, he was noncompliant with these secondary to finances. At office visits I gave him samples repeatedly.  He is currently on budesonide Pulmicort nebulizer. Also currently on tiotropium bromide--Stiolto Respimat. Also nasal cannula oxygen. Also albuterol nebulizer.  Also in the past we had [facility] evaluate and visit him but then he was not compliant with following up with them and letting them help him.  We recently did contact them again and I do see that they have entered recent notes starting on 01/25/15 up through 02/09/15.  At visit 02/25/15 he states that he is "all choked up" says that he has phlegm in his chest and throat. In using his nebulizer a lot for 5 times a day and at night. Says that he has oxygen at home but he just uses that one or 2 times per day as needed and sometimes at night.  At visit 02/25/15 he did bring in blood sugar log sheet. He does have documented a fasting reading for every single morning. No other readings at any other times of day. For the month of May all of his readings are ranging 121-200. He only has 3 readings above 200. May 12 was 230. May 14 was 206. May 26-220 For the month of April fasting readings all range from 109  to 215. However most of these readings are around 130. For the month of March, fasting readings range mostly from 143 to around 170.   At [place] 05/27/2015-- He reports "I guess you saw I was in the hospital"---05/09/15--Preseptal Cellulitis.  -----I asked today if he had f/u with Ophthalmolgist and he says he did not---b/c that's when his wife fell. Says his wife has been in the hospital for 2 weeks. Says she is now in [place] at [employer]. Says "all that running back and forth with her has run me ragged"  I see notes in Epic from"[facility]"---I asked pt if any nurses/staff have come to his house--and he says "no" and acts like he has seen/heard nothing of this.-- I then reviewed further---[facility] Note 03/22/2015---documents that pt does not want further f/u from them--"does not see the need" He also brings in BS readings.  I reviewed my last set of labs---that result note said to start documenting 2 hour post prandial BS readings.  However, readings today are all fasting but are documented for each/every day.  For July: 130s, 160s, also 200, 253, 220, 268 For August: 140-150 mostly, but also 170, 211, 212, 181. One reading of 278---but only one this high.  At [place] 08/30/2015: Reviewed that last A1C 05/27/15 was >14.  He says that at that time he did increase Levemir from 20 units QHS  to 25 units QHS.  Brings BS log: Only has Fasting readings: FOr every morning : Some 131, 121, 134, 111,  Most are 150-160 range. Highest: 200 and 284 but these are the only 2 high readings like this.   Says he has cough at night that keeps him awake--says some nights not so bad, but some nights, cough when lay down. Uses Albuterol multiple times in the night on those nights.  Says he is using all Pulm meds as directed and that they have not run out.   His last Routine OV was 08/30/2015, but after that, he had f/u OVs to f/u glucose and adjust Insulin.  At visit 08/30/15 A1c was 14 that he was told to increase  Levemir from 25-30 units in follow-up 2 weeks. He had visit 09/15/15--- that time he had increased and was at Levemir 30 units. At that visit had him increase further to 35 units. He then had follow-up visit 09/20/15--at time he reported that he was up to 35 units. At that visit increased further to 40 units.  12/01/2015: States that his insulin is at 40 units. He brings in blood sugar log sheet with fasting readings daily. These readings include: 278, 230, 197, 269, 218, 287, 241, 259, 245, 221, 193, 140, 197, 191, 190, 233, 323. He brought in no readings from any other time of day. These were all fasting. Also brings me a copy of print out that is by [employer] that lists all of his medications and information including price. He tells me that he recently had to stop the [employer] and the albuterol because of cost. However the cost for the albuterol says just $3.85  and the [employer] says $8.10 .  He continues to stay off of his cigarettes. Says he hasn't smoked in over 2 years.  Reports that he is taking blood pressure medication as directed with no headedness. Reports that he is taking his pravastatin with no myalgias or other adverse effects.   At [place] 12/01/15-- Told him to increase Insulin to 45 units and return in 1 week for f/u OV--to bring BS meter and log sheet to that appt  At [place] 12/08/2015: He brings his BS meter.  He brings BS log sheet.  I reviewed the numbers in the meter to make sure that these were accurate and an documenting the readings from the meter: All of these are fasting readings : 3/8-----188 3/7---------- 187 3/6-------------- 305 3/5---------- 205 3/4---------- 269 3/3--------- 227 3/2--------- 277 3/1--------- 332  Did look at his blood sugar log sheet. He has different numbers written on there. Asked if his wife is diabetic or if anyone else is using his meter and he says no.  I really think that he is illiterate.  For safety purposes, need to  go by readings on his meter, not on his BS log sheet.   He promises that he is taking insulin daily and is currently using 45 units-----he tells me this dose on his own-- and this is correct-- this is the amount that I told him at last visit  At this time, will have him increase to 55 units and f/u in 1 week with fasting BS readings---ON HIS METER---NOT WRITTEN ON LOG SHEET-------------  AT OV 12/15/2015: He reports that he is now doing insulin 55 units. He did bring his blood sugar meter with him and this is what I have reviewed. Early morning readings: 12/08/15------------------ 188 12/09/15------------------ 207 12/10/15---------------- 158 12/11/15----------------- 119 12/12/15------------------  114 12/13/15------------------ 205 12/14/2015------------- 185 12/15/15------------------ 267.  All of these fasting readings are performed around 3 AM. He states that he always wakes up around 3 AM. Says that he then eats oatmeal or sometimes a boiled egg. He does not eat any lunch. Through the day he just has water or milk. Yesterday around 4 PM he ate pinto beans. Says that later after that he had some milk and a few Nilla Wafers. Says that this is pretty average/typical of his usual daily intake. I asked if part of the reason that he eats a little is because of finances and because this is all he has to eat but he says that that is not it and says that he just isn't hungry for anything more. Asked if he snacks throughout the day and he says that he does not. Asked if he usually eats something like Nilla wafers in the evening--and he says not all the time.   03/23/2016: Says was able to get med for constipation (Amitiza)--says it is working well ---(Had OV with me regarding this 03/16/2016) Says he has "heartburn"---asks if I cna give him some medicine to use for heartburn. Brings BS log and has meter also Is not filling in log correctly again at this time--CAN NOT GO BYY LOG--MUST LOOK AT  METER Readings from meter: All of these are taken at ~ 2: 00 a.m. 6/22--- 190 6/19---  302 6/18--  285 6/17--  227 6/16--  347 6/14--  397 6/10---  263 6/8--  316  Iasked how much Insulin he is giving--says " 55"  Which is correct. Says he gives this ~ 6 pm then watches TV then goes to bed ~ 8 pm. At that visit I increased his insulin to 65 units daily at bedtime and told him to return in about one week. Bring blood sugar meter with him to that appointment.  03/30/2016:  Asked how much insulin he is giving and he says 65 units which is correct. Also he brings in his meter and also his sheet where he has been writing down blood sugars. The blood sugars that he has written down in the column that he says are the knee readings since last visit do correspond to the readings over the past week on his meter. 6/29--------145 6/28------182 6/27------239 6/26----------178 6/25-------109 6/24---------190 6/23---------171   Past Medical History  Diagnosis Date  . Diabetes mellitus   . COPD (chronic obstructive pulmonary disease) ([place])   . Hypertension   . Allergy     Rhinitis  . Elevated lipids   . Pulmonary fibrosis ([place])   . Bronchitis   . Colon polyps   . Vitamin D deficiency   . PSA elevation   . Chronic respiratory failure ([place])   . Hypercholesterolemia   . On home O2     2L N/C      Home Meds: Outpatient Prescriptions Prior to Visit  Medication Sig Dispense Refill  . albuterol (PROVENTIL) (2.5 MG/3ML) 0.083% nebulizer solution INHALE 1 VIAL VIA NEBULIZER EVERY 6 HOURS AS NEEDED FOR WHEEZING OR SHORTNESS OF BREATH 360 mL 5  . aspirin 81 MG EC tablet TAKE 1 TABLET BY MOUTH ONCE DAILY 30 tablet 3  . budesonide (PULMICORT) 0.5 MG/2ML nebulizer solution Take 2 mLs (0.5 mg total) by nebulization 2 (two) times daily. 120 mL 5  . EASY TOUCH PEN NEEDLES 31G X 8 MM MISC USE 1 PEN NEEDLE ONCE DAILY AS DIRECTED 100 each 3  . glucose blood (ONE TOUCH ULTRA  TEST) test strip 1 each by  Other route 2 (two) times daily. Check blood sugar each morning and then 2hrs after any meal. 100 each 11  . Insulin Detemir (LEVEMIR FLEXTOUCH) 100 UNIT/ML Pen 65 units at bedtime 15 mL 11  . losartan (COZAAR) 100 MG tablet Take 1 tablet (100 mg total) by mouth daily. 30 tablet 5  . lubiprostone (AMITIZA) 24 MCG capsule Take 1 capsule (24 mcg total) by mouth 2 (two) times daily with a meal. 60 capsule 5  . metFORMIN (GLUCOPHAGE) 1000 MG tablet Take 1 tablet (1,000 mg total) by mouth 2 (two) times daily. 60 tablet 5  . Omega-3 Fatty Acids (FISH OIL) 1000 MG CAPS Take 1 capsule by mouth 2 (two) times daily.     Marland Kitchen omeprazole (PRILOSEC) 20 MG capsule Take 1 capsule (20 mg total) by mouth daily. 30 capsule 3  . ONE TOUCH ULTRA TEST test strip CHECK FASTING BLOOD SUGAR TWICE DAILY 100 each 5  . ONETOUCH DELICA LANCETS 99991111 MISC USE TO CHECK BLOOD SUGAR TWICE DAILY AS DIRECTED 100 each 3  . pravastatin (PRAVACHOL) 80 MG tablet Take 1 tablet (80 mg total) by mouth at bedtime. 30 tablet 5  . PROAIR RESPICLICK 123XX123 (90 BASE) MCG/ACT AEPB INHALE 2 PUFFS INTO LUNGS EVERY 6 HOURS AS NEEDED FOR WHEEZING OR SHORTNESS OF BREATH 1 each 5  . Tiotropium Bromide-Olodaterol (STIOLTO RESPIMAT) 2.5-2.5 MCG/ACT AERS Inhale 2 Inhalers into the lungs daily. 1 Inhaler 5   No facility-administered medications prior to visit.     Allergies:  Allergies  Allergen Reactions  . Ace Inhibitors     Hyperkalemia--07/23/2013:patient states not familiar with the following allergy    Social History   Social History  . Marital Status: Married    Spouse Name: N/A  . Number of Children: N/A  . Years of Education: N/A   Occupational History  . 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 Smoking status: Former Smoker -- 1.50 packs/day for 60 years    Types: Cigarettes    Quit date: 12/31/2012  . Smokeless tobacco: Never Used  . Alcohol Use: No  . Drug Use: No  . Sexual Activity: Yes    Birth Control/  Protection: None   Other Topics Concern  . Not on file   Social History Narrative    Family History  Problem Relation Age of Onset  . CAD Other   . Diabetes Other   . Heart disease Mother      Review of Systems:  See HPI for pertinent ROS. All other ROS negative.    Physical Exam: Blood pressure 142/68, pulse 74, temperature 98.4 F (36.9 C), temperature source Oral, resp. rate 16, height 5\' 5"  (1.651 m), weight 160 lb (72.576 kg)., Body mass index is 26.63 kg/(m^2).  General: WNWD WM, "Weathered Appearance" Appears in no acute distress. Neck: Supple. No thyromegaly. No lymphadenopathy. No carotid bruits. Lungs: Very slight wheeze heard at right lower lung. Remainder clear. But distant BS, decreased air movement throughout.  Heart: RRR with S1 S2. No murmurs, rubs, or gallops. Abdomen: Soft, non-tender, non-distended with normoactive bowel sounds. No hepatomegaly. No rebound/guarding. No obvious abdominal masses. Musculoskeletal:  Strength and tone normal for age. Extremities/Skin: Warm and dry. No edema.  Neuro: Alert and oriented X 3. Moves all extremities spontaneously. Gait is normal. CNII-XII grossly in tact. Psych:  Responds to questions appropriately with a normal affect. Diabetic foot exam: Inspection is normal.  No wounds or concerning lesions. Sensation is intact. On the right: 2+ dorsalis pedis 2+ PT. On the left 1+ DP. 2+ PT.      ASSESSMENT AND PLAN:  81 y.o. year old male with    AT [facility] 03/30/2016--  ----------Increase Insulin to 70 UNITS QHS-- -------- Can wait 3 months for routine OV F/U sooner if needed  At this visit 03/30/16 he also states that he is almost out of insulin. At that point in the conversation I thought he got it at the regular pharmacy and told him he can go there and get a refill. He says that they bring it to him and usually let him know when he needs it but they have not brought any more. Then realized he is getting insulin from [facility]. [name] is supplying him with some samples to use and has also put in a phone call to [facility] to follow-up with them and make sure they are aware that we have increased his dose up to 70 units and to make them aware that he is almost out if Insulin.  THE FOLLOWING IS COPIED FROM PRIOR OV BUT NOT ADDRESSED AT OV 03/30/2016---------------ONLY BS WERE DISCUSSED AT OV 03/30/2016   1. History of repeated noncompliance in the past secondary to finances.  In the past he frequently could not afford all of his oral diabetic medications. In the past has also reported that he is unable to afford his Spiriva and Symbicort. We have been providing him with samples of both Spiriva and Symbicort. As well, we have been providing him with samples of Lantus or Levemir as often as possible. At his visit with me 03/12/14 I had [name] to contact [employer] [employer] to see whether they can help provide some assistance to him. [facility] follow-up with him but he did not want their assistance --01/2015--[facility] has reached out to pt again.  Is now on Neb meds At [place] 05/27/2015----I asked pt about "[facility]" and he says "no"--nobody coming to his house, nobody assisting him with his treatment-- I then reviewed further---[facility] Note 03/22/2015---documents that pt does not want further f/u from them--"does not see the need"  At [facility] 12/01/2015--Reports not currently using Pulmonary meds sec to cost---At that OV I gave him 2 samples of Advair to use for now.   2. history of smoking. He quit smoking January 2014. Congratulations and staying off of the cigarettes!!  3. COPD At OV 08/30/15--will add low dose prednisone---do not want BS to increase furhter but do not want him to develop COPD exacerbation Status post ER visit for COPD on 11/04/13 Another ER visit for COPD on 11/06/13 Status post hospitalization for COPD exacerbation 11/15/13 See HPI regarding Spiriva and Symbicort.  He is on now on Pulmicort nebulizer  (budesonide) He is now on Stiolto Respimat (tiotropium bromide) He has oxygen nasal cannula at home but is not using it regularly.  Diabetes  At [facility] 03/23/2016---Instructed him to INCREASE INSULIN TO  55 UNITS QHS. F/U OV 1 WEEK. Bring BS meter.   - Hemoglobin A1c  Microalbumin 05/27/2015 I have discussed need for routine eye exams and dental exams but he hasn't been able to followup with the secondary to finances. On ARB. (H/O hyperkalemia with ACE Inh) On statin. On ASA 81mg .   2. Hypertension At [facility] 07/23/13 his potassium was even higher. It had been borderline high prior to this. At that time his ACE inhibitor was stopped and he was started on ARB.  BP hgih at [place] 05/27/2015----will monitor prior to adjusting meds---pt says "it is b/c he has been running ragged b/c of his wife being in the hospital" BP good at OV 08/30/15  3. Elevated lipids He is on Pravastatin 80mg . FLP excellent 02/25/2015 with LDL 68. LFTs were normal.  5. Pulmonary fibrosis Managed by Pulmonary  6. Elevated PSA He had elevated PSA at [facility] 07/2012. Also prior to this PSA was elevated. He has been informed this could be secondary to prostate cancer but refused followup with urology. He still refuses to recheck PSA. He is aware that he could have prostate cancer but does not want further evaluation or followup with his AT ALL.  7. Colon polyps History of Hemoccult-positive stool x3 in 07/2008. Refused GI evaluation despite being informed of risk of severe GI bleed, cancer, et Ronney Asters. H./H. normal 03/2010 and 01/2011. Refuses colonoscopy.  8. Immunizations: We have discussed influenza vaccine last flu season and he is deferred. Pneumonia vaccine: He received Pneumovax June 2003.  Received  Prevnar 13  10/23/2013 .  Tetanus: Gave in September 2005. Not covered by Medicare --therefore has deferred f/u sec to cost.  Zostavax: He has had so much problem with compliance, I have not even discussed this with pt!!  F/U  OV 1  week.    Signed, [address] [place], [place], [facility] 03/30/2016 11:20 AM

## 2016-04-17 DIAGNOSIS — J449 Chronic obstructive pulmonary disease, unspecified: Secondary | ICD-10-CM | POA: Diagnosis not present

## 2016-04-19 ENCOUNTER — Other Ambulatory Visit: Payer: Self-pay | Admitting: Physician Assistant

## 2016-05-02 DIAGNOSIS — J441 Chronic obstructive pulmonary disease with (acute) exacerbation: Secondary | ICD-10-CM | POA: Diagnosis not present

## 2016-05-18 DIAGNOSIS — J449 Chronic obstructive pulmonary disease, unspecified: Secondary | ICD-10-CM | POA: Diagnosis not present

## 2016-05-25 ENCOUNTER — Other Ambulatory Visit: Payer: Self-pay | Admitting: Physician Assistant

## 2016-05-26 NOTE — Telephone Encounter (Signed)
Medication refilled per protocol. 

## 2016-05-31 DIAGNOSIS — J441 Chronic obstructive pulmonary disease with (acute) exacerbation: Secondary | ICD-10-CM | POA: Diagnosis not present

## 2016-06-18 DIAGNOSIS — J449 Chronic obstructive pulmonary disease, unspecified: Secondary | ICD-10-CM | POA: Diagnosis not present

## 2016-07-06 ENCOUNTER — Encounter: Payer: Self-pay | Admitting: Physician Assistant

## 2016-07-06 ENCOUNTER — Ambulatory Visit (INDEPENDENT_AMBULATORY_CARE_PROVIDER_SITE_OTHER): Payer: Medicare Other | Admitting: Physician Assistant

## 2016-07-06 VITALS — BP 140/72 | HR 91 | Temp 97.7°F | Resp 16 | Wt 154.0 lb

## 2016-07-06 DIAGNOSIS — E1165 Type 2 diabetes mellitus with hyperglycemia: Secondary | ICD-10-CM

## 2016-07-06 DIAGNOSIS — J8410 Pulmonary fibrosis, unspecified: Secondary | ICD-10-CM

## 2016-07-06 DIAGNOSIS — E1121 Type 2 diabetes mellitus with diabetic nephropathy: Secondary | ICD-10-CM | POA: Diagnosis not present

## 2016-07-06 DIAGNOSIS — R972 Elevated prostate specific antigen [PSA]: Secondary | ICD-10-CM | POA: Diagnosis not present

## 2016-07-06 DIAGNOSIS — IMO0002 Reserved for concepts with insufficient information to code with codable children: Secondary | ICD-10-CM

## 2016-07-06 DIAGNOSIS — Z794 Long term (current) use of insulin: Secondary | ICD-10-CM | POA: Diagnosis not present

## 2016-07-06 DIAGNOSIS — I10 Essential (primary) hypertension: Secondary | ICD-10-CM

## 2016-07-06 DIAGNOSIS — Z9114 Patient's other noncompliance with medication regimen: Secondary | ICD-10-CM

## 2016-07-06 DIAGNOSIS — J439 Emphysema, unspecified: Secondary | ICD-10-CM

## 2016-07-06 DIAGNOSIS — J441 Chronic obstructive pulmonary disease with (acute) exacerbation: Secondary | ICD-10-CM

## 2016-07-06 DIAGNOSIS — E785 Hyperlipidemia, unspecified: Secondary | ICD-10-CM | POA: Diagnosis not present

## 2016-07-06 DIAGNOSIS — I480 Paroxysmal atrial fibrillation: Secondary | ICD-10-CM

## 2016-07-06 MED ORDER — PREDNISONE 20 MG PO TABS: 20.0000 mg | ORAL_TABLET | Freq: Every day | ORAL | 0 refills | Status: DC

## 2016-07-06 NOTE — Progress Notes (Signed)
Patient ID: Edward [name] MRN: [id number], DOB: [birth year], 81 y.o. Date of Encounter: @DATE @  Chief Complaint:  Chief Complaint  Patient presents with  . Follow-up    HPI: 81 y.o. year old male  presents for routine followup office visit.    At office visit with me 10/30/13 at which time we stopped multiple oral medicines and started Lantus. On 11/04/13 he had an ER visit with COPD. On 11/06/13 he had another ER visit with COPD. 11/10/2013 he had office visit with me to followup with COPD. 11/15/13 he was hospitalized with COPD. 12/01/13 have followup office visit with me regarding his COPD. 12/08/13 he had another followup office visit with me.  In the past I had placed him on Spiriva and Symbicort for his COPD. Repeatedly, he was noncompliant with these secondary to finances. At office visits I gave him samples repeatedly.  He is currently on budesonide Pulmicort nebulizer. Also currently on tiotropium bromide--Stiolto Respimat. Also nasal cannula oxygen. Also albuterol nebulizer.  Also in the past we had [facility] evaluate and visit him but then he was not compliant with following up with them and letting them help him.  We recently did contact them again and I do see that they have entered recent notes starting on 01/25/15 up through 02/09/15.  At visit 02/25/15 he states that he is "all choked up" says that he has phlegm in his chest and throat. In using his nebulizer a lot for 5 times a day and at night. Says that he has oxygen at home but he just uses that one or 2 times per day as needed and sometimes at night.  At visit 02/25/15 he did bring in blood sugar log sheet. He does have documented a fasting reading for every single morning. No other readings at any other times of day. For the month of May all of his readings are ranging 121-200. He only has 3 readings above 200. May 12 was 230. May 14 was 206. May 26-220 For the month of April fasting readings all range from 109 to 215.  However most of these readings are around 130. For the month of March, fasting readings range mostly from 143 to around 170.   At [place] 05/27/2015-- He reports "I guess you saw I was in the hospital"---05/09/15--Preseptal Cellulitis.  -----I asked today if he had f/u with Ophthalmolgist and he says he did not---b/c that's when his wife fell. Says his wife has been in the hospital for 2 weeks. Says she is now in [place] at [employer]. Says "all that running back and forth with her has run me ragged"  I see notes in Epic from"[facility]"---I asked pt if any nurses/staff have come to his house--and he says "no" and acts like he has seen/heard nothing of this.-- I then reviewed further---[facility] Note 03/22/2015---documents that pt does not want further f/u from them--"does not see the need" He also brings in BS readings.  I reviewed my last set of labs---that result note said to start documenting 2 hour post prandial BS readings.  However, readings today are all fasting but are documented for each/every day.  For July: 130s, 160s, also 200, 253, 220, 268 For August: 140-150 mostly, but also 170, 211, 212, 181. One reading of 278---but only one this high.  At [place] 08/30/2015: Reviewed that last A1C 05/27/15 was >14.  He says that at that time he did increase Levemir from 20 units QHS to 25 units QHS.  Brings  BS log: Only has Fasting readings: FOr every morning : Some 131, 121, 134, 111,  Most are 150-160 range. Highest: 200 and 284 but these are the only 2 high readings like this.   Says he has cough at night that keeps him awake--says some nights not so bad, but some nights, cough when lay down. Uses Albuterol multiple times in the night on those nights.  Says he is using all Pulm meds as directed and that they have not run out.   His last Routine OV was 08/30/2015, but after that, he had f/u OVs to f/u glucose and adjust Insulin.  At visit 08/30/15 A1c was 14 that he was told to increase Levemir from  25-30 units in follow-up 2 weeks. He had visit 09/15/15--- that time he had increased and was at Levemir 30 units. At that visit had him increase further to 35 units. He then had follow-up visit 09/20/15--at time he reported that he was up to 35 units. At that visit increased further to 40 units.  12/01/2015: States that his insulin is at 40 units. He brings in blood sugar log sheet with fasting readings daily. These readings include: 278, 230, 197, 269, 218, 287, 241, 259, 245, 221, 193, 140, 197, 191, 190, 233, 323. He brought in no readings from any other time of day. These were all fasting. Also brings me a copy of print out that is by [place] that lists all of his medications and information including price. He tells me that he recently had to stop the [employer] and the albuterol because of cost. However the cost for the albuterol says just $3.85  and the [employer] says $8.10 .  He continues to stay off of his cigarettes. Says he hasn't smoked in over 2 years.  Reports that he is taking blood pressure medication as directed with no headedness. Reports that he is taking his pravastatin with no myalgias or other adverse effects.   At [place] 12/01/15-- Told him to increase Insulin to 45 units and return in 1 week for f/u OV--to bring BS meter and log sheet to that appt  At [place] 12/08/2015: He brings his BS meter.  He brings BS log sheet.  I reviewed the numbers in the meter to make sure that these were accurate and an documenting the readings from the meter: All of these are fasting readings : 3/8-----188 3/7---------- 187 3/6-------------- 305 3/5---------- 205 3/4---------- 269 3/3--------- 227 3/2--------- 277 3/1--------- 332  Did look at his blood sugar log sheet. He has different numbers written on there. Asked if his wife is diabetic or if anyone else is using his meter and he says no.  I really think that he is illiterate.  For safety purposes, need to go by  readings on his meter, not on his BS log sheet.   He promises that he is taking insulin daily and is currently using 45 units-----he tells me this dose on his own-- and this is correct-- this is the amount that I told him at last visit  At this time, will have him increase to 55 units and f/u in 1 week with fasting BS readings---ON HIS METER---NOT WRITTEN ON LOG SHEET-------------  AT [facility] 12/15/2015: He reports that he is now doing insulin 55 units. He did bring his blood sugar meter with him and this is what I have reviewed. Early morning readings: 12/08/15------------------ 188 12/09/15------------------ 207 12/10/15---------------- 158 12/11/15----------------- 119 12/12/15------------------ 114 12/13/15------------------ 205 12/14/2015------------- 185 12/15/15------------------  267.  All of these fasting readings are performed around 3 AM. He states that he always wakes up around 3 AM. Says that he then eats oatmeal or sometimes a boiled egg. He does not eat any lunch. Through the day he just has water or milk. Yesterday around 4 PM he ate pinto beans. Says that later after that he had some milk and a few Nilla Wafers. Says that this is pretty average/typical of his usual daily intake. I asked if part of the reason that he eats a little is because of finances and because this is all he has to eat but he says that that is not it and says that he just isn't hungry for anything more. Asked if he snacks throughout the day and he says that he does not. Asked if he usually eats something like Nilla wafers in the evening--and he says not all the time.   03/23/2016: Says was able to get med for constipation (Amitiza)--says it is working well ---(Had OV with me regarding this 03/16/2016) Says he has "heartburn"---asks if I cna give him some medicine to use for heartburn. Brings BS log and has meter also Is not filling in log correctly again at this time--CAN NOT GO BYY LOG--MUST LOOK AT  METER Readings from meter: All of these are taken at ~ 2: 00 a.m. 6/22--- 190 6/19---  302 6/18--  285 6/17--  227 6/16--  347 6/14--  397 6/10---  263 6/8--  316  Iasked how much Insulin he is giving--says " 55"  Which is correct. Says he gives this ~ 6 pm then watches TV then goes to bed ~ 8 pm. At that visit I increased his insulin to 65 units daily at bedtime and told him to return in about one week. Bring blood sugar meter with him to that appointment.  03/30/2016:  Asked how much insulin he is giving and he says 65 units which is correct. Also he brings in his meter and also his sheet where he has been writing down blood sugars. The blood sugars that he has written down in the column that he says are the knee readings since last visit do correspond to the readings over the past week on his meter. 6/29--------145 6/28------182 6/27------239 6/26----------178 6/25-------109 6/24---------190 6/23---------171  07/06/2016: He reports that he has used no insulin for about one month now secondary to cost.----What he is reporting is consistent with "doughnut hole " States that he has been using his breathing medications so far but soon will not be able to afford the refills on these either secondary to hitting the doughnut hole. During his visit today when just speaking with him I can tell that he sounds like he has some wheezing and increased congestion. He said this has been a little worse just the past few days. No other complaints or concerns today. He does bring in some blood sugar log readings but they have been a little higher recently as he has been out of his insulin. I am not even documenting them since he is not even using his insulin. Says that his blood sugar monitor seems like some strip has gotten stuck down in the near and he cannot use his monitor. We have given him a new sample box today. This includes a new meter and supplies.  Past Medical History:  Diagnosis Date   . Allergy    Rhinitis  . Bronchitis   . Chronic respiratory failure ([place])   . Colon polyps   .  COPD (chronic obstructive pulmonary disease) ([place])   . Diabetes mellitus   . Elevated lipids   . Hypercholesterolemia   . Hypertension   . On home O2    2L N/C   . PSA elevation   . Pulmonary fibrosis ([place])   . Vitamin D deficiency      Home Meds: Outpatient Medications Prior to Visit  Medication Sig Dispense Refill  . albuterol (PROVENTIL) (2.5 MG/3ML) 0.083% nebulizer solution INHALE 1 VIAL VIA NEBULIZER EVERY 6 HOURS AS NEEDED FOR WHEEZING OR SHORTNESS OF BREATH 360 mL 5  . aspirin 81 MG EC tablet TAKE 1 TABLET BY MOUTH ONCE DAILY 30 tablet 3  . budesonide (PULMICORT) 0.5 MG/2ML nebulizer solution Take 2 mLs (0.5 mg total) by nebulization 2 (two) times daily. 120 mL 5  . glucose blood (ONE TOUCH ULTRA TEST) test strip 1 each by Other route 2 (two) times daily. Check blood sugar each morning and then 2hrs after any meal. 100 each 11  . losartan (COZAAR) 100 MG tablet Take 1 tablet (100 mg total) by mouth daily. 30 tablet 5  . lubiprostone (AMITIZA) 24 MCG capsule Take 1 capsule (24 mcg total) by mouth 2 (two) times daily with a meal. 60 capsule 5  . metFORMIN (GLUCOPHAGE) 1000 MG tablet Take 1 tablet (1,000 mg total) by mouth 2 (two) times daily. 60 tablet 5  . Omega-3 Fatty Acids (FISH OIL) 1000 MG CAPS Take 1 capsule by mouth 2 (two) times daily.     Marland Kitchen omeprazole (PRILOSEC) 20 MG capsule Take 1 capsule (20 mg total) by mouth daily. 30 capsule 3  . ONE TOUCH ULTRA TEST test strip CHECK FASTING BLOOD SUGAR TWICE DAILY 100 each 5  . ONETOUCH DELICA LANCETS 99991111 MISC USE TO CHECK BLOOD SUGAR TWICE DAILY AS DIRECTED 100 each 2  . pravastatin (PRAVACHOL) 80 MG tablet Take 1 tablet (80 mg total) by mouth at bedtime. 30 tablet 5  . Tiotropium Bromide-Olodaterol (STIOLTO RESPIMAT) 2.5-2.5 MCG/ACT AERS Inhale 2 Inhalers into the lungs daily. 1 Inhaler 5  . EASY TOUCH PEN NEEDLES 31G X 8 MM MISC  USE 1 PEN NEEDLE ONCE DAILY AS DIRECTED (Patient not taking: Reported on 07/06/2016) 100 each 3  . Insulin Detemir (LEVEMIR FLEXTOUCH) 100 UNIT/ML Pen 70 units at bedtime (Patient not taking: Reported on 07/06/2016) 15 mL 11  . PROAIR RESPICLICK 123XX123 (90 Base) MCG/ACT AEPB INHALE 2 PUFFS INTO LUNGS EVERY 6 HOURS AS NEEDED FOR WHEEZING OR SHORTNESS OF BREATH (Patient not taking: Reported on 07/06/2016) 1 each 5   No facility-administered medications prior to visit.      Allergies:  Allergies  Allergen Reactions  . Ace Inhibitors     Hyperkalemia--07/23/2013:patient states not familiar with the following allergy    Social History   Social History  . Marital status: Married    Spouse name: N/A  . Number of children: N/A  . Years of education: N/A   Occupational History  . 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 Smoking status: Former Smoker    Packs/day: 1.50    Years: 60.00    Types: Cigarettes    Quit date: 12/31/2012  . Smokeless tobacco: Never Used  . Alcohol use No  . Drug use: No  . Sexual activity: Yes    Birth control/ protection: None   Other Topics Concern  . Not on file   Social History Narrative  . No narrative on file  Family History  Problem Relation Age of Onset  . CAD Other   . Diabetes Other   . Heart disease Mother      Review of Systems:  See HPI for pertinent ROS. All other ROS negative.    Physical Exam: Blood pressure 140/72, pulse 91, temperature 97.7 F (36.5 C), resp. rate 16, weight 154 lb (69.9 kg), SpO2 95 %., Body mass index is 25.63 kg/m.  General: WNWD WM, "Weathered Appearance" Appears in no acute distress. Neck: Supple. No thyromegaly. No lymphadenopathy. No carotid bruits. Lungs: Mild wheezes throughout, bilaterally.  Heart: RRR with S1 S2. No murmurs, rubs, or gallops. Abdomen: Soft, non-tender, non-distended with normoactive bowel sounds. No hepatomegaly. No rebound/guarding. No obvious abdominal  masses. Musculoskeletal:  Strength and tone normal for age. Extremities/Skin: Warm and dry. No edema.  Neuro: Alert and oriented X 3. Moves all extremities spontaneously. Gait is normal. CNII-XII grossly in tact. Psych:  Responds to questions appropriately with a normal affect. Diabetic foot exam: Inspection is normal. No wounds or concerning lesions. Sensation is intact. On the right: 2+ dorsalis pedis 2+ PT. On the left 1+ DP. 2+ PT.      ASSESSMENT AND PLAN:  81 y.o. year old male with    COPD exacerbation ([place]) At [place] 07/06/16 I am adding the following prednisone for current COPD exacerbation. As well he is to continue current COPD medications. Today I did provide him with samples of Steolto - predniSONE (DELTASONE) 20 MG tablet; Take 1 tablet (20 mg total) by mouth daily with breakfast.  Dispense: 5 tablet; Refill: 0   History of repeated noncompliance in the past secondary to finances.  In the past he frequently could not afford all of his oral diabetic medications. In the past has also reported that he is unable to afford his Spiriva and Symbicort. We have been providing him with samples of both Spiriva and Symbicort. As well, we have been providing him with samples of Lantus or Levemir as often as possible. At his visit with me 03/12/14 I had [name] to contact [employer] to see whether they can help provide some assistance to him. [facility] follow-up with him but he did not want their assistance --01/2015--[facility] has reached out to pt again.  Is now on Neb meds At [place] 05/27/2015----I asked pt about "[facility]" and he says "no"--nobody coming to his house, nobody assisting him with his treatment-- I then reviewed further---[facility] Note 03/22/2015---documents that pt does not want further f/u from them--"does not see the need"  At [facility] 12/01/2015--Reports not currently using Pulmonary meds sec to cost---At that OV I gave him 2 samples of Advair to use for now.   AT [facility] 07/06/2016---Gave him  samples of Long-acting Insulin Pen---Gave him 3 or 4 At [facility] 07/06/2016---Gave him 3 samples of Steolto  history of smoking. He quit smoking January 2014. Congratulations and staying off of the cigarettes!!  COPD At [facility] 08/30/15--will add low dose prednisone---do not want BS to increase furhter but do not want him to develop COPD exacerbation Status post ER visit for COPD on 11/04/13 Another ER visit for COPD on 11/06/13 Status post hospitalization for COPD exacerbation 11/15/13 See HPI regarding Spiriva and Symbicort.  He is on now on Pulmicort nebulizer (budesonide) He is now on Stiolto Respimat (tiotropium bromide) He has oxygen nasal cannula at home but is not using it regularly.  Diabetes  At [facility] 03/30/2016---Instructed him to INCREASE INSULIN TO 70 UNITS QHS.--THIS IS LAST DOSE I INSTRUCTED  HIM TO DO   - Hemoglobin A1c  Microalbumin 05/27/2015, 07/06/2016 I have discussed need for routine eye exams and dental exams but he hasn't been able to followup with the secondary to finances. On ARB. (H/O hyperkalemia with ACE Inh) On statin. On ASA 81mg .   Hypertension At [facility] 07/23/13 his potassium was even higher. It had been borderline high prior to this. At that time his ACE inhibitor was stopped and he was started on ARB. BP hgih at [place] 05/27/2015----will monitor prior to adjusting meds---pt says "it is b/c he has been running ragged b/c of his wife being in the hospital" BP good at [facility] 08/30/15  Elevated lipids He is on Pravastatin 80mg . FLP excellent 02/25/2015 with LDL 68. LFTs were normal.  Pulmonary fibrosis Managed by Pulmonary  Elevated PSA He had elevated PSA at [facility] 07/2012. Also prior to this PSA was elevated. He has been informed this could be secondary to prostate cancer but refused followup with urology. He still refuses to recheck PSA. He is aware that he could have prostate cancer but does not want further evaluation or followup with his AT ALL.  Colon polyps History of  Hemoccult-positive stool x3 in 07/2008. Refused GI evaluation despite being informed of risk of severe GI bleed, cancer, et Ronney Asters. H./H. normal 03/2010 and 01/2011. Refuses colonoscopy.  Immunizations: We have discussed influenza vaccine last flu season and he is deferred. Pneumonia vaccine: He received Pneumovax June 2003.  Received  Prevnar 13  10/23/2013 .  Tetanus: Gave in September 2005. Not covered by Medicare --therefore has deferred f/u sec to cost.  Zostavax: He has had so much problem with compliance, I have not even discussed this with pt!!     Signed, [address] [place], [place], [facility] 07/06/2016 10:25 AM

## 2016-07-07 LAB — COMPLETE METABOLIC PANEL WITH GFR
ALT: 8 U/L — AB (ref 9–46)
AST: 9 U/L — AB (ref 10–35)
Albumin: 3.8 g/dL (ref 3.6–5.1)
Alkaline Phosphatase: 94 U/L (ref 40–115)
BILIRUBIN TOTAL: 0.3 mg/dL (ref 0.2–1.2)
BUN: 22 mg/dL (ref 7–25)
CO2: 31 mmol/L (ref 20–31)
Calcium: 9.5 mg/dL (ref 8.6–10.3)
Chloride: 92 mmol/L — ABNORMAL LOW (ref 98–110)
Creat: 1.07 mg/dL (ref 0.70–1.18)
GFR, EST NON AFRICAN AMERICAN: 66 mL/min (ref >=60)
GFR, Est African American: 76 mL/min (ref >=60)
GLUCOSE: 384 mg/dL — AB (ref 70–99)
Potassium: 5.4 mmol/L — ABNORMAL HIGH (ref 3.5–5.3)
SODIUM: 132 mmol/L — AB (ref 135–146)
TOTAL PROTEIN: 6.8 g/dL (ref 6.1–8.1)

## 2016-07-07 LAB — HEMOGLOBIN A1C
Hgb A1c MFr Bld: 13.6 % — ABNORMAL HIGH (ref <5.7)
Mean Plasma Glucose: 344 mg/dL

## 2016-07-07 LAB — MICROALBUMIN, URINE: MICROALB UR: 32.8 mg/dL

## 2016-07-14 ENCOUNTER — Telehealth: Payer: Self-pay | Admitting: Physician Assistant

## 2016-07-14 NOTE — Telephone Encounter (Signed)
Spoke with Shamal and informed him we did not have samples

## 2016-07-14 NOTE — Telephone Encounter (Signed)
Edward [name] wife called in states he needs samples of easy touch pen needles and proair respiclick  CB# [phone]

## 2016-07-18 ENCOUNTER — Telehealth: Payer: Self-pay | Admitting: Physician Assistant

## 2016-07-18 DIAGNOSIS — J449 Chronic obstructive pulmonary disease, unspecified: Secondary | ICD-10-CM | POA: Diagnosis not present

## 2016-07-18 NOTE — Telephone Encounter (Signed)
Patient is calling to see if Edward [name]  will prescribe him something for wheezing and cough  [phone]

## 2016-07-19 NOTE — Telephone Encounter (Signed)
Called pt  to get other info regarding his cough. Pt does not have VM

## 2016-07-24 ENCOUNTER — Emergency Department (HOSPITAL_COMMUNITY): Payer: Medicare Other

## 2016-07-24 ENCOUNTER — Encounter (HOSPITAL_COMMUNITY): Payer: Self-pay | Admitting: Emergency Medicine

## 2016-07-24 ENCOUNTER — Inpatient Hospital Stay (HOSPITAL_COMMUNITY)
Admission: EM | Admit: 2016-07-24 | Discharge: 2016-07-26 | DRG: 190 | Disposition: A | Discharge status: Home / Self Care | Payer: Medicare Other | Attending: Internal Medicine | Admitting: Internal Medicine

## 2016-07-24 DIAGNOSIS — Z7982 Long term (current) use of aspirin: Secondary | ICD-10-CM

## 2016-07-24 DIAGNOSIS — IMO0002 Reserved for concepts with insufficient information to code with codable children: Secondary | ICD-10-CM | POA: Diagnosis present

## 2016-07-24 DIAGNOSIS — Z9981 Dependence on supplemental oxygen: Secondary | ICD-10-CM | POA: Diagnosis not present

## 2016-07-24 DIAGNOSIS — E872 Acidosis: Secondary | ICD-10-CM | POA: Diagnosis present

## 2016-07-24 DIAGNOSIS — Z794 Long term (current) use of insulin: Secondary | ICD-10-CM | POA: Diagnosis not present

## 2016-07-24 DIAGNOSIS — I5189 Other ill-defined heart diseases: Secondary | ICD-10-CM

## 2016-07-24 DIAGNOSIS — E1165 Type 2 diabetes mellitus with hyperglycemia: Secondary | ICD-10-CM | POA: Diagnosis not present

## 2016-07-24 DIAGNOSIS — J9621 Acute and chronic respiratory failure with hypoxia: Secondary | ICD-10-CM | POA: Diagnosis present

## 2016-07-24 DIAGNOSIS — Z833 Family history of diabetes mellitus: Secondary | ICD-10-CM | POA: Diagnosis not present

## 2016-07-24 DIAGNOSIS — J8410 Pulmonary fibrosis, unspecified: Secondary | ICD-10-CM | POA: Diagnosis present

## 2016-07-24 DIAGNOSIS — E1121 Type 2 diabetes mellitus with diabetic nephropathy: Secondary | ICD-10-CM | POA: Diagnosis not present

## 2016-07-24 DIAGNOSIS — Z8249 Family history of ischemic heart disease and other diseases of the circulatory system: Secondary | ICD-10-CM | POA: Diagnosis not present

## 2016-07-24 DIAGNOSIS — J441 Chronic obstructive pulmonary disease with (acute) exacerbation: Secondary | ICD-10-CM | POA: Diagnosis not present

## 2016-07-24 DIAGNOSIS — Z87891 Personal history of nicotine dependence: Secondary | ICD-10-CM

## 2016-07-24 DIAGNOSIS — Z22322 Carrier or suspected carrier of Methicillin resistant Staphylococcus aureus: Secondary | ICD-10-CM

## 2016-07-24 DIAGNOSIS — I10 Essential (primary) hypertension: Secondary | ICD-10-CM | POA: Diagnosis not present

## 2016-07-24 DIAGNOSIS — Z79899 Other long term (current) drug therapy: Secondary | ICD-10-CM | POA: Diagnosis not present

## 2016-07-24 DIAGNOSIS — Z7951 Long term (current) use of inhaled steroids: Secondary | ICD-10-CM | POA: Diagnosis not present

## 2016-07-24 DIAGNOSIS — J449 Chronic obstructive pulmonary disease, unspecified: Secondary | ICD-10-CM | POA: Diagnosis not present

## 2016-07-24 DIAGNOSIS — Z888 Allergy status to other drugs, medicaments and biological substances status: Secondary | ICD-10-CM | POA: Diagnosis not present

## 2016-07-24 DIAGNOSIS — R0602 Shortness of breath: Secondary | ICD-10-CM | POA: Diagnosis not present

## 2016-07-24 DIAGNOSIS — R05 Cough: Secondary | ICD-10-CM | POA: Diagnosis not present

## 2016-07-24 DIAGNOSIS — J439 Emphysema, unspecified: Secondary | ICD-10-CM

## 2016-07-24 DIAGNOSIS — R2689 Other abnormalities of gait and mobility: Secondary | ICD-10-CM

## 2016-07-24 LAB — CBC WITH DIFFERENTIAL/PLATELET
BASOS PCT: 1 %
Basophils Absolute: 0.1 10*3/uL (ref 0.0–0.1)
EOS ABS: 1.4 10*3/uL — AB (ref 0.0–0.7)
EOS PCT: 13 %
HCT: 40.7 % (ref 39.0–52.0)
Hemoglobin: 12.8 g/dL — ABNORMAL LOW (ref 13.0–17.0)
LYMPHS ABS: 2 10*3/uL (ref 0.7–4.0)
Lymphocytes Relative: 19 %
MCH: 25.5 pg — AB (ref 26.0–34.0)
MCHC: 31.4 g/dL (ref 30.0–36.0)
MCV: 81.2 fL (ref 78.0–100.0)
Monocytes Absolute: 1.3 10*3/uL — ABNORMAL HIGH (ref 0.1–1.0)
Monocytes Relative: 12 %
Neutro Abs: 5.6 10*3/uL (ref 1.7–7.7)
Neutrophils Relative %: 55 %
PLATELETS: 370 10*3/uL (ref 150–400)
RBC: 5.01 MIL/uL (ref 4.22–5.81)
RDW: 16.5 % — ABNORMAL HIGH (ref 11.5–15.5)
WBC: 10.3 10*3/uL (ref 4.0–10.5)

## 2016-07-24 LAB — BASIC METABOLIC PANEL
Anion gap: 5 (ref 5–15)
BUN: 17 mg/dL (ref 6–20)
CO2: 30 mmol/L (ref 22–32)
CREATININE: 0.91 mg/dL (ref 0.61–1.24)
Calcium: 9 mg/dL (ref 8.9–10.3)
Chloride: 97 mmol/L — ABNORMAL LOW (ref 101–111)
GFR calc Af Amer: >60 mL/min (ref >60)
Glucose, Bld: 217 mg/dL — ABNORMAL HIGH (ref 65–99)
POTASSIUM: 4.6 mmol/L (ref 3.5–5.1)
SODIUM: 132 mmol/L — AB (ref 135–145)

## 2016-07-24 LAB — BLOOD GAS, ARTERIAL
Acid-Base Excess: 4.5 mmol/L — ABNORMAL HIGH (ref 0.0–2.0)
Bicarbonate: 27.3 mmol/L (ref 20.0–28.0)
Drawn by: 2005
FIO2: 100
O2 Saturation: 99.6 %
PCO2 ART: 64 mmHg — AB (ref 32.0–48.0)
PO2 ART: 373 mmHg — AB (ref 83.0–108.0)
pH, Arterial: 7.3 — ABNORMAL LOW (ref 7.350–7.450)

## 2016-07-24 LAB — GLUCOSE, CAPILLARY
GLUCOSE-CAPILLARY: 244 mg/dL — AB (ref 65–99)
Glucose-Capillary: 283 mg/dL — ABNORMAL HIGH (ref 65–99)
Glucose-Capillary: 285 mg/dL — ABNORMAL HIGH (ref 65–99)

## 2016-07-24 LAB — TROPONIN I

## 2016-07-24 LAB — MRSA PCR SCREENING: MRSA by PCR: POSITIVE — AB

## 2016-07-24 MED ORDER — IPRATROPIUM-ALBUTEROL 0.5-2.5 (3) MG/3ML IN SOLN
3.0000 mL | Freq: Four times a day (QID) | RESPIRATORY_TRACT | Status: DC
  Administered 2016-07-24 – 2016-07-26 (×9): 3 mL via RESPIRATORY_TRACT
  Filled 2016-07-24 (×9): qty 3

## 2016-07-24 MED ORDER — INSULIN ASPART 100 UNIT/ML ~~LOC~~ SOLN
0.0000 [IU] | Freq: Every day | SUBCUTANEOUS | Status: DC
  Administered 2016-07-24: 3 [IU] via SUBCUTANEOUS

## 2016-07-24 MED ORDER — BUDESONIDE 0.5 MG/2ML IN SUSP
0.5000 mg | Freq: Two times a day (BID) | RESPIRATORY_TRACT | Status: DC
  Administered 2016-07-24 – 2016-07-26 (×5): 0.5 mg via RESPIRATORY_TRACT
  Filled 2016-07-24 (×5): qty 2

## 2016-07-24 MED ORDER — METHYLPREDNISOLONE SODIUM SUCC 125 MG IJ SOLR
125.0000 mg | Freq: Once | INTRAMUSCULAR | Status: AC
Start: 2016-07-24 — End: 2016-07-24
  Administered 2016-07-24: 125 mg via INTRAVENOUS
  Filled 2016-07-24: qty 2

## 2016-07-24 MED ORDER — IPRATROPIUM BROMIDE 0.02 % IN SOLN
1.0000 mg | Freq: Once | RESPIRATORY_TRACT | Status: AC
  Administered 2016-07-24: 1 mg via RESPIRATORY_TRACT
  Filled 2016-07-24: qty 5

## 2016-07-24 MED ORDER — INSULIN ASPART 100 UNIT/ML ~~LOC~~ SOLN
0.0000 [IU] | Freq: Three times a day (TID) | SUBCUTANEOUS | Status: DC
  Administered 2016-07-24: 8 [IU] via SUBCUTANEOUS
  Administered 2016-07-24: 4 [IU] via SUBCUTANEOUS
  Administered 2016-07-25 – 2016-07-26 (×2): 2 [IU] via SUBCUTANEOUS

## 2016-07-24 MED ORDER — SODIUM CHLORIDE 0.9% FLUSH: 3.0000 mL | INTRAVENOUS | Status: DC | PRN

## 2016-07-24 MED ORDER — PRAVASTATIN SODIUM 40 MG PO TABS
80.0000 mg | ORAL_TABLET | Freq: Every day | ORAL | Status: DC
  Administered 2016-07-24 – 2016-07-25 (×2): 80 mg via ORAL
  Filled 2016-07-24 (×2): qty 2

## 2016-07-24 MED ORDER — ASPIRIN EC 81 MG PO TBEC
81.0000 mg | DELAYED_RELEASE_TABLET | Freq: Every day | ORAL | Status: DC
  Administered 2016-07-24 – 2016-07-26 (×3): 81 mg via ORAL
  Filled 2016-07-24 (×3): qty 1

## 2016-07-24 MED ORDER — METHYLPREDNISOLONE SODIUM SUCC 125 MG IJ SOLR
60.0000 mg | Freq: Four times a day (QID) | INTRAMUSCULAR | Status: DC
  Administered 2016-07-24 – 2016-07-26 (×8): 60 mg via INTRAVENOUS
  Filled 2016-07-24 (×8): qty 2

## 2016-07-24 MED ORDER — ALBUTEROL SULFATE (2.5 MG/3ML) 0.083% IN NEBU
2.5000 mg | INHALATION_SOLUTION | RESPIRATORY_TRACT | Status: DC | PRN
  Administered 2016-07-24: 2.5 mg via RESPIRATORY_TRACT
  Filled 2016-07-24: qty 3

## 2016-07-24 MED ORDER — ACETAMINOPHEN 650 MG RE SUPP: 650.0000 mg | Freq: Four times a day (QID) | RECTAL | Status: DC | PRN

## 2016-07-24 MED ORDER — INSULIN DETEMIR 100 UNIT/ML ~~LOC~~ SOLN
70.0000 [IU] | Freq: Every day | SUBCUTANEOUS | Status: DC
  Administered 2016-07-24 – 2016-07-25 (×2): 70 [IU] via SUBCUTANEOUS
  Filled 2016-07-24 (×3): qty 0.7

## 2016-07-24 MED ORDER — ENOXAPARIN SODIUM 40 MG/0.4ML ~~LOC~~ SOLN
40.0000 mg | SUBCUTANEOUS | Status: DC
  Administered 2016-07-24 – 2016-07-25 (×2): 40 mg via SUBCUTANEOUS
  Filled 2016-07-24 (×2): qty 0.4

## 2016-07-24 MED ORDER — LUBIPROSTONE 24 MCG PO CAPS
24.0000 ug | ORAL_CAPSULE | Freq: Two times a day (BID) | ORAL | Status: DC
  Administered 2016-07-24 – 2016-07-26 (×4): 24 ug via ORAL
  Filled 2016-07-24 (×4): qty 1

## 2016-07-24 MED ORDER — AZITHROMYCIN 250 MG PO TABS
500.0000 mg | ORAL_TABLET | Freq: Every day | ORAL | Status: AC
  Administered 2016-07-24: 500 mg via ORAL
  Filled 2016-07-24: qty 2

## 2016-07-24 MED ORDER — AZITHROMYCIN 250 MG PO TABS
250.0000 mg | ORAL_TABLET | Freq: Every day | ORAL | Status: DC
  Administered 2016-07-25 – 2016-07-26 (×2): 250 mg via ORAL
  Filled 2016-07-24 (×2): qty 1

## 2016-07-24 MED ORDER — SODIUM CHLORIDE 0.9 % IV SOLN: 250.0000 mL | INTRAVENOUS | Status: DC | PRN

## 2016-07-24 MED ORDER — ONDANSETRON HCL 4 MG/2ML IJ SOLN: 4.0000 mg | Freq: Four times a day (QID) | INTRAMUSCULAR | Status: DC | PRN

## 2016-07-24 MED ORDER — ACETAMINOPHEN 325 MG PO TABS
650.0000 mg | ORAL_TABLET | Freq: Four times a day (QID) | ORAL | Status: DC | PRN
Start: 2016-07-24 — End: 2016-07-26

## 2016-07-24 MED ORDER — ALBUTEROL (5 MG/ML) CONTINUOUS INHALATION SOLN
15.0000 mg/h | INHALATION_SOLUTION | RESPIRATORY_TRACT | Status: DC
Start: 2016-07-24 — End: 2016-07-24
  Administered 2016-07-24: 15 mg/h via RESPIRATORY_TRACT
  Filled 2016-07-24: qty 20

## 2016-07-24 MED ORDER — GUAIFENESIN ER 600 MG PO TB12
1200.0000 mg | ORAL_TABLET | Freq: Two times a day (BID) | ORAL | Status: DC
  Administered 2016-07-24 – 2016-07-26 (×5): 1200 mg via ORAL
  Filled 2016-07-24 (×5): qty 2

## 2016-07-24 MED ORDER — SODIUM CHLORIDE 0.9% FLUSH
3.0000 mL | Freq: Two times a day (BID) | INTRAVENOUS | Status: DC
  Administered 2016-07-24 – 2016-07-26 (×5): 3 mL via INTRAVENOUS

## 2016-07-24 MED ORDER — ONDANSETRON HCL 4 MG PO TABS: 4.0000 mg | ORAL_TABLET | Freq: Four times a day (QID) | ORAL | Status: DC | PRN

## 2016-07-24 MED ORDER — PANTOPRAZOLE SODIUM 40 MG PO TBEC
40.0000 mg | DELAYED_RELEASE_TABLET | Freq: Every day | ORAL | Status: DC
  Administered 2016-07-24 – 2016-07-26 (×3): 40 mg via ORAL
  Filled 2016-07-24 (×3): qty 1

## 2016-07-24 MED ORDER — LOSARTAN POTASSIUM 50 MG PO TABS
100.0000 mg | ORAL_TABLET | Freq: Every day | ORAL | Status: DC
  Administered 2016-07-24 – 2016-07-26 (×3): 100 mg via ORAL
  Filled 2016-07-24 (×3): qty 2

## 2016-07-24 NOTE — Plan of Care (Signed)
Problem: Education: Goal: Knowledge of [facility] Education information/materials will improve Outcome: Progressing Patient educated on medications given today, and the benefits of his breathing treatments. General educational material discussed with patient. Patient refused the flu vaccine at this time and he had the pneumococcal vaccine last year.  Problem: Safety: Goal: Ability to remain free from injury will improve Outcome: Progressing Patient has remained free from injury to this point. Very stable on feet and uses call bell adequately.   Problem: Health Behavior/Discharge Planning: Goal: Ability to manage health-related needs will improve Outcome: Progressing Patient educated on the importance of using his oxygen at home and allowing rest periods.   Problem: Activity: Goal: Risk for activity intolerance will decrease Outcome: Progressing Patient ambulated in room today twice and tolerated well. He says at home he walks around occasionally but cannot climb stairs. Will continue to monitor patient and promote activity with rest periods.

## 2016-07-24 NOTE — ED Triage Notes (Signed)
Pt states that he has had increase in SOB the last couple of days with a nonproductive cough and getting worse

## 2016-07-24 NOTE — H&P (Signed)
History and Physical    Edward [name] [id number] DOB: 02-Jan-1935 DOA: 07/24/2016  PCP: Karis Juba, PA-C  Patient coming from: home  Chief Complaint: shortness of breath  HPI: Edward [name] is a 81 y.o. male with medical history significant of chronic respiratory failure on 2 L of oxygen, COPD, pulmonary fibrosis who reports being short of breath for the past 2 weeks. He feels that shortness of breath has progressively gotten worse. He's not had any fever. He reports having cold type symptoms (runny nose, myalgias, sore throat) approximately 1-2 weeks ago. Since then he's had cough productive of clear colored sputum. He denies any chest pain. He has had increased wheezing. He has not had any sick contacts. Since the shortness of breath was not improving, he came to the ER for evaluation.  ED Course: In the ED he was noted to have increased work of breathing. ABG showed some CO2 retention with respiratory acidosis. He was started on BiPAP. Chest x-ray did not show any pneumonia, only bronchitic changes. He received nebulizer treatments and steroids. Remainder of his lab work was unremarkable. He's been referred for admission.  Review of Systems: As per HPI otherwise 10 point review of systems negative.    Past Medical History:  Diagnosis Date  . Allergy    Rhinitis  . Bronchitis   . Chronic respiratory failure ([place])   . Colon polyps   . COPD (chronic obstructive pulmonary disease) ([place])   . Diabetes mellitus   . Elevated lipids   . Hypercholesterolemia   . Hypertension   . On home O2    2L N/C   . PSA elevation   . Pulmonary fibrosis ([place])   . Vitamin D deficiency     Past Surgical History:  Procedure Laterality Date  . CATARACT EXTRACTION W/PHACO  06/25/2012   Procedure: CATARACT EXTRACTION PHACO AND INTRAOCULAR LENS PLACEMENT (IOC);  Surgeon: Elta Guadeloupe T. Gershon Crane, MD;  Location: [facility];  Service: Ophthalmology;  Laterality: Left;  CDE=19.01  . CATARACT EXTRACTION W/PHACO   07/09/2012   Procedure: CATARACT EXTRACTION PHACO AND INTRAOCULAR LENS PLACEMENT (IOC);  Surgeon: Elta Guadeloupe T. Gershon Crane, MD;  Location: [facility];  Service: Ophthalmology;  Laterality: Right;  CDE: 20.09     reports that he quit smoking about 3 years ago. His smoking use included Cigarettes. He has a 90.00 pack-year smoking history. He has never used smokeless tobacco. He reports that he does not drink alcohol or use drugs.  Allergies  Allergen Reactions  . Ace Inhibitors     Hyperkalemia--07/23/2013:patient states not familiar with the following allergy    Family History  Problem Relation Age of Onset  . Heart disease Mother   . CAD Other   . Diabetes Other      Prior to Admission medications   Medication Sig Start Date End Date Taking? Authorizing Provider  albuterol (PROVENTIL) (2.5 MG/3ML) 0.083% nebulizer solution INHALE 1 VIAL VIA NEBULIZER EVERY 6 HOURS AS NEEDED FOR WHEEZING OR SHORTNESS OF BREATH 12/28/15   Orlena Sheldon, PA-C  aspirin 81 MG EC tablet TAKE 1 TABLET BY MOUTH ONCE DAILY 08/19/15   Orlena Sheldon, PA-C  budesonide (PULMICORT) 0.5 MG/2ML nebulizer solution Take 2 mLs (0.5 mg total) by nebulization 2 (two) times daily. 11/19/15   Mary B Dixon, PA-C  EASY TOUCH PEN NEEDLES 31G X 8 MM MISC USE 1 PEN NEEDLE ONCE DAILY AS DIRECTED Patient not taking: Reported on 07/06/2016 08/19/15   Orlena Sheldon, PA-C  glucose blood (  ONE TOUCH ULTRA TEST) test strip 1 each by Other route 2 (two) times daily. Check blood sugar each morning and then 2hrs after any meal. 11/01/15   Orlena Sheldon, PA-C  Insulin Detemir (LEVEMIR FLEXTOUCH) 100 UNIT/ML Pen 70 units at bedtime Patient not taking: Reported on 07/06/2016 03/30/16   Orlena Sheldon, PA-C  losartan (COZAAR) 100 MG tablet Take 1 tablet (100 mg total) by mouth daily. 03/13/16   Lonie Peak Dixon, PA-C  lubiprostone (AMITIZA) 24 MCG capsule Take 1 capsule (24 mcg total) by mouth 2 (two) times daily with a meal. 03/21/16   Orlena Sheldon, PA-C  metFORMIN  (GLUCOPHAGE) 1000 MG tablet Take 1 tablet (1,000 mg total) by mouth 2 (two) times daily. 03/13/16   Orlena Sheldon, PA-C  Omega-3 Fatty Acids (FISH OIL) 1000 MG CAPS Take 1 capsule by mouth 2 (two) times daily.     Historical Provider, MD  omeprazole (PRILOSEC) 20 MG capsule Take 1 capsule (20 mg total) by mouth daily. 03/23/16   Orlena Sheldon, PA-C  ONE TOUCH ULTRA TEST test strip CHECK FASTING BLOOD SUGAR TWICE DAILY 01/24/16   Orlena Sheldon, PA-C  ONETOUCH DELICA LANCETS 99991111 MISC USE TO CHECK BLOOD SUGAR TWICE DAILY AS DIRECTED 04/20/16   Orlena Sheldon, PA-C  pravastatin (PRAVACHOL) 80 MG tablet Take 1 tablet (80 mg total) by mouth at bedtime. 03/13/16   Lonie Peak Dixon, PA-C  predniSONE (DELTASONE) 20 MG tablet Take 1 tablet (20 mg total) by mouth daily with breakfast. 07/06/16   Orlena Sheldon, PA-C  PROAIR RESPICLICK [id number] [phone] Base) MCG/ACT AEPB INHALE 2 PUFFS INTO LUNGS EVERY 6 HOURS AS NEEDED FOR WHEEZING OR SHORTNESS OF BREATH Patient not taking: Reported on 07/06/2016 05/26/16   Orlena Sheldon, PA-C  Tiotropium Bromide-Olodaterol (STIOLTO RESPIMAT) 2.5-2.5 MCG/ACT AERS Inhale 2 Inhalers into the lungs daily. 12/29/14   Susy Frizzle, MD    Physical Exam: Vitals:   07/24/16 0730 07/24/16 0800 07/24/16 0810 07/24/16 1000  BP: 168/83 132/62  (!) 159/57  Pulse: 98 85 93 93  Resp: 19 16  (!) 28  SpO2: 100% 96% 97% 100%  Weight:      Height:          Constitutional: NAD, calm, comfortable, on bipap Vitals:   07/24/16 0730 07/24/16 0800 07/24/16 0810 07/24/16 1000  BP: 168/83 132/62  (!) 159/57  Pulse: 98 85 93 93  Resp: 19 16  (!) 28  SpO2: 100% 96% 97% 100%  Weight:      Height:       Eyes: PERRL, lids and conjunctivae normal ENMT: Mucous membranes are moist. Posterior pharynx clear of any exudate or lesions.Normal dentition.  Neck: normal, supple, no masses, no thyromegaly Respiratory: mild wheeze bilaterally, fair air movement, no crackles. Normal respiratory effort. No accessory muscle use.   Cardiovascular: Regular rate and rhythm, no murmurs / rubs / gallops. No extremity edema. 2+ pedal pulses. No carotid bruits.  Abdomen: no tenderness, no masses palpated. No hepatosplenomegaly. Bowel sounds positive.  Musculoskeletal: no clubbing / cyanosis. No joint deformity upper and lower extremities. Good ROM, no contractures. Normal muscle tone.  Skin: no rashes, lesions, ulcers. No induration Neurologic: CN 2-12 grossly intact. Sensation intact, DTR normal. Strength 5/5 in all 4.  Psychiatric: Normal judgment and insight. Alert and oriented x 3. Normal mood.   Labs on Admission: I have personally reviewed following labs and imaging studies  CBC:  Recent Labs Lab 07/24/16 0525  WBC 10.3  NEUTROABS 5.6  HGB 12.8*  HCT 40.7  MCV 81.2  PLT 0000000   Basic Metabolic Panel:  Recent Labs Lab 07/24/16 0525  NA 132*  K 4.6  CL 97*  CO2 30  GLUCOSE 217*  BUN 17  CREATININE 0.91  CALCIUM 9.0   GFR: Estimated Creatinine Clearance: 57.3 mL/min (by C-G formula based on SCr of 0.91 mg/dL). Liver Function Tests: No results for input(s): AST, ALT, ALKPHOS, BILITOT, PROT, ALBUMIN in the last 168 hours. No results for input(s): LIPASE, AMYLASE in the last 168 hours. No results for input(s): AMMONIA in the last 168 hours. Coagulation Profile: No results for input(s): INR, PROTIME in the last 168 hours. Cardiac Enzymes:  Recent Labs Lab 07/24/16 0525  TROPONINI <0.03   BNP (last 3 results) No results for input(s): PROBNP in the last 8760 hours. HbA1C: No results for input(s): HGBA1C in the last 72 hours. CBG: No results for input(s): GLUCAP in the last 168 hours. Lipid Profile: No results for input(s): CHOL, HDL, LDLCALC, TRIG, CHOLHDL, LDLDIRECT in the last 72 hours. Thyroid Function Tests: No results for input(s): TSH, T4TOTAL, FREET4, T3FREE, THYROIDAB in the last 72 hours. Anemia Panel: No results for input(s): VITAMINB12, FOLATE, FERRITIN, TIBC, IRON, RETICCTPCT in  the last 72 hours. Urine analysis: No results found for: COLORURINE, APPEARANCEUR, LABSPEC, PHURINE, GLUCOSEU, HGBUR, BILIRUBINUR, KETONESUR, PROTEINUR, UROBILINOGEN, NITRITE, LEUKOCYTESUR Sepsis Labs: !!!!!!!!!!!!!!!!!!!!!!!!!!!!!!!!!!!!!!!!!!!! @LABRCNTIP (procalcitonin:4,lacticidven:4) )No results found for this or any previous visit (from the past 240 hour(s)).   Radiological Exams on Admission: Dg Chest Portable 1 View  Result Date: 07/24/2016 CLINICAL DATA:  Increasing shortness of breath for few days, nonproductive nonproductive worsening cough. History of COPD, pulmonary fibrosis. EXAM: PORTABLE CHEST 1 VIEW COMPARISON:  Chest radiograph Feb 13, 2016 FINDINGS: Apical lordotic technique accentuates the hilar lung markings. Mild bronchitic change without pleural effusion or focal consolidation. Cardiomediastinal silhouette is normal. No pneumothorax. Soft tissue planes and included osseous structures are nonsuspicious. IMPRESSION: Mild bronchitic changes. Electronically Signed   By: Elon Alas M.D.   On: 07/24/2016 06:29    EKG: Independently reviewed. Sinus tachycardia, no acute changes  Assessment/Plan Active Problems:   Diabetes mellitus type 2, uncontrolled ([name])   Hypertension   Pulmonary fibrosis ([name])   COPD exacerbation ([name])   Acute on chronic respiratory failure with hypoxia ([name])  1. Acute on chronic respiratory failure with hypoxia. Patient is currently on BiPAP. He appears to be clinically improving. We'll try to wean back to his baseline oxygen requirement.  2. COPD exacerbation. Start on IV steroids, nebulizer treatments. Continue inhaled budesonide. Start the patient on Z-Pak. Continue on mucolytics and pulmonary hygiene.  3. Pulmonary fibrosis. Continue IV steroids. Follow-up with neurology.  4. Hypertension. Continue losartan.  5. Diabetes. Continue baseline Levemir. Supplement with sliding scale insulin.   DVT prophylaxis: lovenox Code Status: full  code Family Communication: discussed with wife at the bedside Disposition Plan: discharge home once improved Consults called:  Admission status: Lona Millard MD [facility] Pager [phone]  If 7PM-7AM, please contact night-coverage [email or web address] Password [email or web address]  07/24/2016, 10:40 AM

## 2016-07-24 NOTE — ED Provider Notes (Signed)
TIME SEEN: 5:20 AM  CHIEF COMPLAINT: Short of breath  HPI: Pt is a 81 y.o. male with history of COPD, hypertension, diabetes, hyperlipidemia, pulmonary fibrosis on 2 L of home oxygen who presents to the emergency department with shortness of breath. Shortness of breath has been chronic for him to worsened over the past week. He denies any fevers, cough, chest pain, lower extremity swelling or pain.  ROS: See HPI Constitutional: no fever  Eyes: no drainage  ENT: no runny nose   Cardiovascular:  no chest pain  Resp:  SOB  GI: no vomiting GU: no dysuria Integumentary: no rash  Allergy: no hives  Musculoskeletal: no leg swelling  Neurological: no slurred speech ROS otherwise negative  PAST MEDICAL HISTORY/PAST SURGICAL HISTORY:  Past Medical History:  Diagnosis Date  . Allergy    Rhinitis  . Bronchitis   . Chronic respiratory failure ([place])   . Colon polyps   . COPD (chronic obstructive pulmonary disease) ([place])   . Diabetes mellitus   . Elevated lipids   . Hypercholesterolemia   . Hypertension   . On home O2    2L N/C   . PSA elevation   . Pulmonary fibrosis ([place])   . Vitamin D deficiency     MEDICATIONS:  Prior to Admission medications   Medication Sig Start Date End Date Taking? Authorizing Provider  albuterol (PROVENTIL) (2.5 MG/3ML) 0.083% nebulizer solution INHALE 1 VIAL VIA NEBULIZER EVERY 6 HOURS AS NEEDED FOR WHEEZING OR SHORTNESS OF BREATH 12/28/15   Orlena Sheldon, PA-C  aspirin 81 MG EC tablet TAKE 1 TABLET BY MOUTH ONCE DAILY 08/19/15   Orlena Sheldon, PA-C  budesonide (PULMICORT) 0.5 MG/2ML nebulizer solution Take 2 mLs (0.5 mg total) by nebulization 2 (two) times daily. 11/19/15   Mary B Dixon, PA-C  EASY TOUCH PEN NEEDLES 31G X 8 MM MISC USE 1 PEN NEEDLE ONCE DAILY AS DIRECTED Patient not taking: Reported on 07/06/2016 08/19/15   Lonie Peak Dixon, PA-C  glucose blood (ONE TOUCH ULTRA TEST) test strip 1 each by Other route 2 (two) times daily. Check blood sugar each  morning and then 2hrs after any meal. 11/01/15   Orlena Sheldon, PA-C  Insulin Detemir (LEVEMIR FLEXTOUCH) 100 UNIT/ML Pen 70 units at bedtime Patient not taking: Reported on 07/06/2016 03/30/16   Orlena Sheldon, PA-C  losartan (COZAAR) 100 MG tablet Take 1 tablet (100 mg total) by mouth daily. 03/13/16   Lonie Peak Dixon, PA-C  lubiprostone (AMITIZA) 24 MCG capsule Take 1 capsule (24 mcg total) by mouth 2 (two) times daily with a meal. 03/21/16   Orlena Sheldon, PA-C  metFORMIN (GLUCOPHAGE) 1000 MG tablet Take 1 tablet (1,000 mg total) by mouth 2 (two) times daily. 03/13/16   Orlena Sheldon, PA-C  Omega-3 Fatty Acids (FISH OIL) 1000 MG CAPS Take 1 capsule by mouth 2 (two) times daily.     Historical Provider, MD  omeprazole (PRILOSEC) 20 MG capsule Take 1 capsule (20 mg total) by mouth daily. 03/23/16   Orlena Sheldon, PA-C  ONE TOUCH ULTRA TEST test strip CHECK FASTING BLOOD SUGAR TWICE DAILY 01/24/16   Orlena Sheldon, PA-C  ONETOUCH DELICA LANCETS [id number] MISC USE TO CHECK BLOOD SUGAR TWICE DAILY AS DIRECTED 04/20/16   Orlena Sheldon, PA-C  pravastatin (PRAVACHOL) 80 MG tablet Take 1 tablet (80 mg total) by mouth at bedtime. 03/13/16   Lonie Peak Dixon, PA-C  predniSONE (DELTASONE) 20 MG tablet Take 1 tablet (  20 mg total) by mouth daily with breakfast. 07/06/16   Orlena Sheldon, PA-C  PROAIR RESPICLICK [id number] [phone] Base) MCG/ACT AEPB INHALE 2 PUFFS INTO LUNGS EVERY 6 HOURS AS NEEDED FOR WHEEZING OR SHORTNESS OF BREATH Patient not taking: Reported on 07/06/2016 05/26/16   Orlena Sheldon, PA-C  Tiotropium Bromide-Olodaterol (STIOLTO RESPIMAT) 2.5-2.5 MCG/ACT AERS Inhale 2 Inhalers into the lungs daily. 12/29/14   Susy Frizzle, MD    ALLERGIES:  Allergies  Allergen Reactions  . Ace Inhibitors     Hyperkalemia--07/23/2013:patient states not familiar with the following allergy    SOCIAL HISTORY:  Social History  Substance Use Topics  . Smoking status: Former Smoker    Packs/day: 1.50    Years: 60.00    Types: Cigarettes    Quit  date: 12/31/2012  . Smokeless tobacco: Never Used  . Alcohol use No    FAMILY HISTORY: Family History  Problem Relation Age of Onset  . Heart disease Mother   . CAD Other   . Diabetes Other     EXAM: Pulse 104   Resp 22   Ht 5\' 5"  (1.651 m)   Wt 150 lb (68 kg)   SpO2 (!) 77%   BMI 24.96 kg/m  CONSTITUTIONAL: Alert and oriented and responds appropriately to questions. Elderly, chronically ill-appearing, in no significant distress HEAD: Normocephalic EYES: Conjunctivae clear, PERRL ENT: normal nose; no rhinorrhea; moist mucous membranes NECK: Supple, no meningismus, no LAD  CARD: RRR; S1 and S2 appreciated; no murmurs, no clicks, no rubs, no gallops RESP: Normal chest excursion without splinting, patient is mildly tachypneic, and tight breath sounds with diminished aeration at bases with extra 3 wheezing, no rhonchi or rales, speaking full sentences, hypoxic on room air with sats of 77%, doing well on 3 L nasal cannula ABD/GI: Normal bowel sounds; non-distended; soft, non-tender, no rebound, no guarding, no peritoneal signs BACK:  The back appears normal and is non-tender to palpation, there is no CVA tenderness EXT: Normal ROM in all joints; non-tender to palpation; no edema; normal capillary refill; no cyanosis, no calf tenderness or swelling    SKIN: Normal color for age and race; warm; no rash NEURO: Moves all extremities equally, sensation to light touch intact diffusely, cranial nerves II through XII intact PSYCH: The patient's mood and manner are appropriate. Grooming and personal hygiene are appropriate.  MEDICAL DECISION MAKING: Patient here with COPD exacerbation. We'll give albuterol, Atrovent, Solu-Medrol. We'll obtain labs, EKG and chest x-ray. We'll also obtain an ABG.  ED PROGRESS: Patient's labs are unremarkable other than ABG with a pH of 7.3, PCO2 of 64. He is very tight on exam and because of his CO2 retention and slightly low pH, will put him on BiPAP. We'll  discuss with medicine for admission. PCP is Dr. Doren Custard.   6:18 AM  Discussed patient's case with Hospitalist, Dr. Darrick Meigs.  Recommend admission to inpatient, stepdown bed.  I will place holding orders per their request. Patient and family (if present) updated with plan. Care transferred to hospitalist service.  I reviewed all nursing notes, vitals, pertinent old records, EKGs, labs, imaging (as available).   EKG Interpretation  Date/Time:  Monday July 24 2016 05:23:30 EDT Ventricular Rate:  100 PR Interval:    QRS Duration: 78 QT Interval:  337 QTC Calculation: 435 R Axis:   70 Text Interpretation:  Sinus tachycardia No significant change since last tracing Confirmed by Sanae Willetts,  DO, Ulysse Siemen [id number]) on 07/24/2016 5:39:30 AM  CRITICAL CARE Performed by: Nyra Jabs   Total critical care time: 40 minutes  Critical care time was exclusive of separately billable procedures and treating other patients.  Critical care was necessary to treat or prevent imminent or life-threatening deterioration.  Critical care was time spent personally by me on the following activities: development of treatment plan with patient and/or surrogate as well as nursing, discussions with consultants, evaluation of patient's response to treatment, examination of patient, obtaining history from patient or surrogate, ordering and performing treatments and interventions, ordering and review of laboratory studies, ordering and review of radiographic studies, pulse oximetry and re-evaluation of patient's condition.     [place], DO 07/24/16 0630

## 2016-07-25 ENCOUNTER — Other Ambulatory Visit: Payer: Self-pay | Admitting: Physician Assistant

## 2016-07-25 LAB — BASIC METABOLIC PANEL
Anion gap: 6 (ref 5–15)
BUN: 34 mg/dL — ABNORMAL HIGH (ref 6–20)
CHLORIDE: 98 mmol/L — AB (ref 101–111)
CO2: 29 mmol/L (ref 22–32)
Calcium: 8.7 mg/dL — ABNORMAL LOW (ref 8.9–10.3)
Creatinine, Ser: 1.05 mg/dL (ref 0.61–1.24)
GFR calc non Af Amer: >60 mL/min (ref >60)
Glucose, Bld: 165 mg/dL — ABNORMAL HIGH (ref 65–99)
Potassium: 4.6 mmol/L (ref 3.5–5.1)
SODIUM: 133 mmol/L — AB (ref 135–145)

## 2016-07-25 LAB — CBC
HCT: 37.8 % — ABNORMAL LOW (ref 39.0–52.0)
HEMOGLOBIN: 11.8 g/dL — AB (ref 13.0–17.0)
MCH: 25.3 pg — AB (ref 26.0–34.0)
MCHC: 31.2 g/dL (ref 30.0–36.0)
MCV: 80.9 fL (ref 78.0–100.0)
PLATELETS: 407 10*3/uL — AB (ref 150–400)
RBC: 4.67 MIL/uL (ref 4.22–5.81)
RDW: 16.9 % — ABNORMAL HIGH (ref 11.5–15.5)
WBC: 13.7 10*3/uL — AB (ref 4.0–10.5)

## 2016-07-25 LAB — GLUCOSE, CAPILLARY
GLUCOSE-CAPILLARY: 111 mg/dL — AB (ref 65–99)
GLUCOSE-CAPILLARY: 100 mg/dL — AB (ref 65–99)
GLUCOSE-CAPILLARY: 145 mg/dL — AB (ref 65–99)
Glucose-Capillary: 131 mg/dL — ABNORMAL HIGH (ref 65–99)

## 2016-07-25 MED ORDER — MUPIROCIN 2 % EX OINT
1.0000 "application " | TOPICAL_OINTMENT | Freq: Two times a day (BID) | CUTANEOUS | Status: DC
  Administered 2016-07-25 – 2016-07-26 (×3): 1 via NASAL
  Filled 2016-07-25 (×3): qty 22

## 2016-07-25 MED ORDER — CHLORHEXIDINE GLUCONATE CLOTH 2 % EX PADS
6.0000 | MEDICATED_PAD | Freq: Every day | CUTANEOUS | Status: DC
  Administered 2016-07-25 – 2016-07-26 (×2): 6 via TOPICAL

## 2016-07-25 NOTE — Progress Notes (Signed)
PROGRESS NOTE    Edward [name]  [id number] DOB: [birth year] DOA: 07/24/2016 PCP: Karis Juba, PA-C    Brief Narrative:  50 yom with a history of chronic respiratory failure on 2L of oxygen, COPD, and  pulmonary fibrosis, presents with shortness of breath. While in the ED, he was noted to have COPD exacerbation and was started on BiPAP, nebs, and steroids. He was admitted for further evaluation of respiratory failure with hypoxia. He is slowly improving and will be transferred out of stepdown today  Assessment & Plan:   Active Problems:   Diabetes mellitus type 2, uncontrolled ([name])   Hypertension   Pulmonary fibrosis ([name])   COPD exacerbation ([name])   Acute on chronic respiratory failure with hypoxia ([place])  1. Acute on chronic respiratory failure with hypoxia. Patient initially required bipap on admission. He appears to be clinically improving. He is back to his baseline oxygen requirement of 2L. 2. COPD exacerbation. Patient continues to have wheezing. We'll continue on IV steroids, nebulizer treatments, inhaled budesonide, and Z-Pak. Continue on mucolytics and pulmonary hygiene. 3. Pulmonary fibrosis. Continue IV steroids. Follow-up with pulmonology. 4. Hypertension. Continue losartan. 5. Diabetes. Continue baseline Levemir. Continue sliding scale insulin  DVT prophylaxis: Lovenox  Code Status: Full  Family Communication: Discussed with patient at bedside  Disposition Plan: Discharge home once improved.   Consultants:   None   Procedures:   None   Antimicrobials:   Zithromax 10/23>>   Subjective: Feeling better. Still wheezing and has productive cough. Has not worn bipap since yesterday  Objective: Vitals:   07/24/16 2012 07/25/16 0000 07/25/16 0256 07/25/16 0500  BP:  (!) 109/57    Pulse:  78    Resp:  17    Temp:  97.6 F (36.4 C)  97.3 F (36.3 C)  TempSrc:  Oral  Oral  SpO2: 95% 96% 99%   Weight:      Height:        Intake/Output Summary  (Last 24 hours) at 07/25/16 0617 Last data filed at 07/25/16 0500  Gross per 24 hour  Intake              595 ml  Output             1275 ml  Net             -680 ml   Filed Weights   07/24/16 0522 07/24/16 1207  Weight: 68 kg (150 lb) 73.8 kg (162 lb 11.2 oz)    Examination:  General exam: Appears calm and comfortable  Respiratory system: bilateral wheezing. Respiratory effort normal. Cardiovascular system: S1 & S2 heard, RRR. No JVD, murmurs, rubs, gallops or clicks. No pedal edema. Gastrointestinal system: Abdomen is nondistended, soft and nontender. No organomegaly or masses felt. Normal bowel sounds heard. Central nervous system: Alert and oriented. No focal neurological deficits. Extremities: Symmetric 5 x 5 power. Skin: No rashes, lesions or ulcers Psychiatry: Judgement and insight appear normal. Mood & affect appropriate.     Data Reviewed: I have personally reviewed following labs and imaging studies  CBC:  Recent Labs Lab 07/24/16 0525 07/25/16 0435  WBC 10.3 13.7*  NEUTROABS 5.6  --   HGB 12.8* 11.8*  HCT 40.7 37.8*  MCV 81.2 80.9  PLT 370 [id number]*   Basic Metabolic Panel:  Recent Labs Lab 07/24/16 0525 07/25/16 0435  NA 132* 133*  K 4.6 4.6  CL 97* 98*  CO2 30 29  GLUCOSE 217* 165*  BUN 17 34*  CREATININE 0.91 1.05  CALCIUM 9.0 8.7*   GFR: Estimated Creatinine Clearance: 48.6 mL/min (by C-G formula based on SCr of 1.05 mg/dL). Liver Function Tests: No results for input(s): AST, ALT, ALKPHOS, BILITOT, PROT, ALBUMIN in the last 168 hours. No results for input(s): LIPASE, AMYLASE in the last 168 hours. No results for input(s): AMMONIA in the last 168 hours. Coagulation Profile: No results for input(s): INR, PROTIME in the last 168 hours. Cardiac Enzymes:  Recent Labs Lab 07/24/16 0525  TROPONINI <0.03   BNP (last 3 results) No results for input(s): PROBNP in the last 8760 hours. HbA1C: No results for input(s): HGBA1C in the last 72  hours. CBG:  Recent Labs Lab 07/24/16 1159 07/24/16 1606 07/24/16 2137  GLUCAP 244* 283* 285*   Lipid Profile: No results for input(s): CHOL, HDL, LDLCALC, TRIG, CHOLHDL, LDLDIRECT in the last 72 hours. Thyroid Function Tests: No results for input(s): TSH, T4TOTAL, FREET4, T3FREE, THYROIDAB in the last 72 hours. Anemia Panel: No results for input(s): VITAMINB12, FOLATE, FERRITIN, TIBC, IRON, RETICCTPCT in the last 72 hours. Sepsis Labs: No results for input(s): PROCALCITON, LATICACIDVEN in the last 168 hours.  Recent Results (from the past 240 hour(s))  MRSA PCR Screening     Status: Abnormal   Collection Time: 07/24/16  9:45 AM  Result Value Ref Range Status   MRSA by PCR POSITIVE (A) NEGATIVE Final    Comment:        The GeneXpert MRSA Assay (FDA approved for NASAL specimens only), is one component of a comprehensive MRSA colonization surveillance program. It is not intended to diagnose MRSA infection nor to guide or monitor treatment for MRSA infections. RESULT CALLED TO, READ BACK BY AND VERIFIED WITH: HEARN J AT 1950 ON [id number] BY FORSYTH K          Radiology Studies: Dg Chest Portable 1 View  Result Date: 07/24/2016 CLINICAL DATA:  Increasing shortness of breath for few days, nonproductive nonproductive worsening cough. History of COPD, pulmonary fibrosis. EXAM: PORTABLE CHEST 1 VIEW COMPARISON:  Chest radiograph Feb 13, 2016 FINDINGS: Apical lordotic technique accentuates the hilar lung markings. Mild bronchitic change without pleural effusion or focal consolidation. Cardiomediastinal silhouette is normal. No pneumothorax. Soft tissue planes and included osseous structures are nonsuspicious. IMPRESSION: Mild bronchitic changes. Electronically Signed   By: Elon Alas M.D.   On: 07/24/2016 06:29        Scheduled Meds: . aspirin EC  81 mg Oral Daily  . azithromycin  250 mg Oral Daily  . budesonide  0.5 mg Nebulization BID  . enoxaparin (LOVENOX)  injection  40 mg Subcutaneous Q24H  . guaiFENesin  1,200 mg Oral BID  . insulin aspart  0-15 Units Subcutaneous TID WC  . insulin aspart  0-5 Units Subcutaneous QHS  . insulin detemir  70 Units Subcutaneous Q2200  . ipratropium-albuterol  3 mL Nebulization Q6H  . losartan  100 mg Oral Daily  . lubiprostone  24 mcg Oral BID WC  . methylPREDNISolone (SOLU-MEDROL) injection  60 mg Intravenous Q6H  . pantoprazole  40 mg Oral Daily  . pravastatin  80 mg Oral QHS  . sodium chloride flush  3 mL Intravenous Q12H   Continuous Infusions:    LOS: 1 day    Time spent: 25 minutes    Kathie Dike, MD [facility] If 7PM-7AM, please contact night-coverage [email or web address] Password [facility] 07/25/2016, 6:17 AM

## 2016-07-25 NOTE — Care Management Note (Signed)
Case Management Note  Patient Details  Name: Edward [name] MRN: [id number] Date of Birth: [birth year]  Subjective/Objective:                  Pt admitted with COPD exacerbation. He lives at home with his wife and son. He is ind with ADL's. He has PCP, drives himself to appointments and has no difficulty obtaining his prescriptions. He has no HH services PTA. He has supplemental oxygen PTA. He has port tanks for when he leaves the home. He has neb machine PTA. He plans to return home with self care at DC.   Action/Plan: No CM needs anticipated.   Expected Discharge Date:    07/26/2016              Expected Discharge Plan:  Home/Self Care  In-House Referral:  NA  Discharge planning Services  CM Consult  Post Acute Care Choice:  NA Choice offered to:  NA  Status of Service:  In process, will continue to follow   Sherald Barge, RN 07/25/2016, 2:54 PM

## 2016-07-25 NOTE — Evaluation (Signed)
Physical Therapy Evaluation Patient Details Name: Edward [name] MRN: [id number] DOB: [birth year] Today's Date: 07/25/2016   History of Present Illness  81 y.o. male with medical history significant of chronic respiratory failure on 2 L of oxygen, COPD, pulmonary fibrosis who reports being short of breath for the past 2 weeks. He feels that shortness of breath has progressively gotten worse. He's not had any fever. He reports having cold type symptoms (runny nose, myalgias, sore throat) approximately 1-2 weeks ago. Since then he's had cough productive of clear colored sputum. He denies any chest pain. He has had increased wheezing. He has not had any sick contacts. Since the shortness of breath was not improving, he came to the ER for evaluation.ABG showed some CO2 retention with respiratory acidosis. He was started on BiPAP. Chest x-ray did not show any pneumonia, only bronchitic changes.  Clinical Impression  Pt received sitting up in the chair, and was agreeable to PT evaluation.  Pt is normally independent with ambulation, as well as ADL's.  During PT evaluation today, he was independent with transfers, and ambulation x465ft with no LOB while pulling along the O2 tank.  Pt does not demonstrate need for skilled PT at this time, therefore, will sign off, and encouraged pt to continue to mobilize.      Follow Up Recommendations No PT follow up    Equipment Recommendations  None recommended by PT    Recommendations for Other Services       Precautions / Restrictions Precautions Precautions: None Restrictions Weight Bearing Restrictions: No      Mobility  Bed Mobility Overal bed mobility:  (Not assessed - pt sitting up in the chair upon entry. )                Transfers Overall transfer level: Independent                  Ambulation/Gait Ambulation/Gait assistance: Independent Ambulation Distance (Feet): 400 Feet Assistive device: None Gait Pattern/deviations:  WFL(Within Functional Limits)     General Gait Details: Pt demonstrates good cadence, and able to pull along his own supplemental O2 tank without any LOB.   Stairs            Wheelchair Mobility    Modified Rankin (Stroke Patients Only)       Balance Overall balance assessment: Independent                                           Pertinent Vitals/Pain Pain Assessment: No/denies pain    Home Living   Living Arrangements: Spouse/significant other (and son) Available Help at Discharge: Available 24 hours/day Type of Home: Mobile home Home Access: Stairs to enter   [employer] of Steps: 4 steps on the porch with HR  Home Layout: One level Home Equipment: Walker - 2 wheels;Other (comment) (home O2 on 2L. )      Prior Function     Gait / Transfers Assistance Needed: independent - able to go ~1./2 mile before needing to rest    ADL's / [place] Needed: independent with ADL's, driving, able to get out into the community and run errands          Hand Dominance   Dominant Hand: Right    Extremity/Trunk Assessment   Upper Extremity Assessment: Overall WFL for tasks assessed  Lower Extremity Assessment: Overall WFL for tasks assessed         Communication   Communication: No difficulties  Cognition Arousal/Alertness: Awake/alert Behavior During Therapy: WFL for tasks assessed/performed Overall Cognitive Status: Within Functional Limits for tasks assessed                      General Comments      Exercises     Assessment/Plan    PT Assessment Patent does not need any further PT services  PT Problem List            PT Treatment Interventions      PT Goals (Current goals can be found in the Care Plan section)  Acute Rehab PT Goals PT Goal Formulation: All assessment and education complete, DC therapy    Frequency     Barriers to discharge        Co-evaluation                End of Session Equipment Utilized During Treatment: Gait belt;Oxygen Activity Tolerance: Patient tolerated treatment well Patient left: in chair;with call bell/phone within reach Nurse Communication: Mobility status Danae Chen, RN visualized pt and PT ambulating in hallway around the unit)    Functional Assessment Tool Used: [place] "6-clicks"  Functional Limitation: Mobility: Walking and moving around Mobility: Walking and Moving Around Current Status [phone]): 0 percent impaired, limited or restricted Mobility: Walking and Moving Around Goal Status [phone]): 0 percent impaired, limited or restricted Mobility: Walking and Moving Around Discharge Status [phone]): 0 percent impaired, limited or restricted    Time: 1110-1125 PT Time Calculation (min) (ACUTE ONLY): 15 min   Charges:   PT Evaluation $PT Eval Low Complexity: 1 Procedure     PT G Codes:   PT G-Codes **NOT FOR INPATIENT CLASS** Functional Assessment Tool Used: [employer] "6-clicks"  Functional Limitation: Mobility: Walking and moving around Mobility: Walking and Moving Around Current Status [phone]): 0 percent impaired, limited or restricted Mobility: Walking and Moving Around Goal Status [phone]): 0 percent impaired, limited or restricted Mobility: Walking and Moving Around Discharge Status [phone]): 0 percent impaired, limited or restricted    Beth Nora Rooke, PT, DPT X: [id number]

## 2016-07-25 NOTE — Progress Notes (Signed)
Pt a/o.vss. Saline lock patent. No complaints of any distress. Report given to M.Joya Gaskins, RN and he verbalized understanding of report. Patient transferred to room 317 via wheelchair with nursing staff.

## 2016-07-26 ENCOUNTER — Other Ambulatory Visit: Payer: Self-pay | Admitting: *Deleted

## 2016-07-26 DIAGNOSIS — J9621 Acute and chronic respiratory failure with hypoxia: Secondary | ICD-10-CM

## 2016-07-26 DIAGNOSIS — J441 Chronic obstructive pulmonary disease with (acute) exacerbation: Principal | ICD-10-CM

## 2016-07-26 LAB — GLUCOSE, CAPILLARY
GLUCOSE-CAPILLARY: 170 mg/dL — AB (ref 65–99)
Glucose-Capillary: 129 mg/dL — ABNORMAL HIGH (ref 65–99)

## 2016-07-26 MED ORDER — AZITHROMYCIN 250 MG PO TABS: ORAL_TABLET | ORAL | 0 refills | Status: DC

## 2016-07-26 MED ORDER — PREDNISONE 20 MG PO TABS: 20.0000 mg | ORAL_TABLET | Freq: Two times a day (BID) | ORAL | 0 refills | Status: DC

## 2016-07-26 MED ORDER — INSULIN DETEMIR 100 UNIT/ML FLEXPEN: PEN_INJECTOR | SUBCUTANEOUS | 11 refills | Status: DC

## 2016-07-26 NOTE — Discharge Summary (Signed)
Physician Discharge Summary  Edward [name] [id number] DOB: 05-Oct-1934 DOA: 07/24/2016  PCP: Karis Juba, PA-C  Admit date: 07/24/2016 Discharge date: 07/26/2016  Admitted From: Home  Disposition:  Home   Recommendations for Outpatient Follow-up:  1. Follow up with PCP in 1-2 weeks 2. Please obtain BMP/CBC in one week  Home Health: No  Equipment/Devices: None   Discharge Condition: Stable  CODE STATUS: Full  Diet recommendation: Carb modified   Brief/Interim Summary: patient was admitted for COPD exacerbation by Dr Jolaine Artist on Jul 24, 2016.  As per his H and P:  " Edward [name] is a 81 y.o. male with medical history significant of chronic respiratory failure on 2 L of oxygen, COPD, pulmonary fibrosis who reports being short of breath for the past 2 weeks. He feels that shortness of breath has progressively gotten worse. He's not had any fever. He reports having cold type symptoms (runny nose, myalgias, sore throat) approximately 1-2 weeks ago. Since then he's had cough productive of clear colored sputum. He denies any chest pain. He has had increased wheezing. He has not had any sick contacts. Since the shortness of breath was not improving, he came to the ER for evaluation.  ED Course: In the ED he was noted to have increased work of breathing. ABG showed some CO2 retention with respiratory acidosis. He was started on BiPAP. Chest x-ray did not show any pneumonia, only bronchitic changes. He received nebulizer treatments and steroids. Remainder of his lab work was unremarkable. He's been referred for admission.  HOSPITAL COURSE:  Patient was admitted for COPD exacerbation, with hx of pulmonary fibrosis, and was given IV steroids, nebs, and IV Zithromax.  Over the course of his stay, he improved, with less wheezing, and on the day of discharge, there was only minimal wheezing.  He has no PNA on chest xray, and was empirically Tx with Zithromax.  He is anxious to go home, and felt  that he is at his baseline.  He has oxygen at home, along with his nebulizer.  He lives with his wife and his children.  He doesn't endorse any further needs.  He will be discharged on another 2 days of Zithromax and 5 days of Prednisone 20mg  BID.  He will follow up with his PCP later this week.    Discharge Diagnoses:  Active Problems:   Diabetes mellitus type 2, uncontrolled ([name])   Hypertension   Pulmonary fibrosis ([name])   COPD exacerbation ([name])   Acute on chronic respiratory failure with hypoxia [facility])    Discharge Instructions   Allergies  Allergen Reactions  . Ace Inhibitors     Hyperkalemia--07/23/2013:patient states not familiar with the following allergy    Consultations:  None    Procedures/Studies: Dg Chest Portable 1 View  Result Date: 07/24/2016 CLINICAL DATA:  Increasing shortness of breath for few days, nonproductive nonproductive worsening cough. History of COPD, pulmonary fibrosis. EXAM: PORTABLE CHEST 1 VIEW COMPARISON:  Chest radiograph Feb 13, 2016 FINDINGS: Apical lordotic technique accentuates the hilar lung markings. Mild bronchitic change without pleural effusion or focal consolidation. Cardiomediastinal silhouette is normal. No pneumothorax. Soft tissue planes and included osseous structures are nonsuspicious. IMPRESSION: Mild bronchitic changes. Electronically Signed   By: Elon Alas M.D.   On: 07/24/2016 06:29   Discharge Exam: Vitals:   07/25/16 2027 07/26/16 0544  BP: (!) 125/52 (!) 132/52  Pulse: 91 96  Resp: 18 (!) 21  Temp: 98.3 F (36.8 C) 98.2 F (36.8 C)  Vitals:   07/26/16 0156 07/26/16 0544 07/26/16 0813 07/26/16 0818  BP:  (!) 132/52    Pulse:  96    Resp:  (!) 21    Temp:  98.2 F (36.8 C)    TempSrc:  Oral    SpO2: 94% 97% 98% 100%  Weight:      Height:        General: Pt is alert, awake, not in acute distress Cardiovascular: RRR, S1/S2 +, no rubs, no gallops Respiratory: CTA bilaterally, mild wheezing, no  rhonchi Abdominal: Soft, NT, ND, bowel sounds + Extremities: no edema, no cyanosis    The results of significant diagnostics from this hospitalization (including imaging, microbiology, ancillary and laboratory) are listed below for reference.     Microbiology: Recent Results (from the past 240 hour(s))  MRSA PCR Screening     Status: Abnormal   Collection Time: 07/24/16  9:45 AM  Result Value Ref Range Status   MRSA by PCR POSITIVE (A) NEGATIVE Final    Comment:        The GeneXpert MRSA Assay (FDA approved for NASAL specimens only), is one component of a comprehensive MRSA colonization surveillance program. It is not intended to diagnose MRSA infection nor to guide or monitor treatment for MRSA infections. RESULT CALLED TO, READ BACK BY AND VERIFIED WITH: HEARN J AT 1950 ON [id number] BY FORSYTH K      Basic Metabolic Panel:  Recent Labs Lab 07/24/16 0525 07/25/16 0435  NA 132* 133*  K 4.6 4.6  CL 97* 98*  CO2 30 29  GLUCOSE 217* 165*  BUN 17 34*  CREATININE 0.91 1.05  CALCIUM 9.0 8.7*    Recent Labs Lab 07/24/16 0525 07/25/16 0435  WBC 10.3 13.7*  NEUTROABS 5.6  --   HGB 12.8* 11.8*  HCT 40.7 37.8*  MCV 81.2 80.9  PLT 370 407*   Cardiac Enzymes:  Recent Labs Lab 07/24/16 0525  TROPONINI <0.03   BNP: Invalid input(s): POCBNP CBG:  Recent Labs Lab 07/25/16 1111 07/25/16 1720 07/25/16 2125 07/26/16 0905 07/26/16 1224  GLUCAP 131* 100* 145* 170* 129*   Microbiology Recent Results (from the past 240 hour(s))  MRSA PCR Screening     Status: Abnormal   Collection Time: 07/24/16  9:45 AM  Result Value Ref Range Status   MRSA by PCR POSITIVE (A) NEGATIVE Final    Comment:        The GeneXpert MRSA Assay (FDA approved for NASAL specimens only), is one component of a comprehensive MRSA colonization surveillance program. It is not intended to diagnose MRSA infection nor to guide or monitor treatment for MRSA infections. RESULT CALLED TO,  READ BACK BY AND VERIFIED WITH: HEARN J AT 1950 ON [id number] BY Mikel Cella K    SIGNED:  Orvan Falconer, MD FACP [facility] 07/26/2016, 12:26 PM If 7PM-7AM, please contact night-coverage [email or web address] Password [email or web address]  By signing my name below, I, Collene Leyden, attest that this documentation has been prepared under the direction and in the presence of Orvan Falconer, MD. Electronically signed: Collene Leyden, Scribe. 07/26/16

## 2016-07-26 NOTE — Progress Notes (Signed)
Pt requested to take BIPAP off states "he can't breathe with the machine on, he can't spit with it on" pt was a little SOB when I can in room and sitting on side of bed . Breathing treatment given and pt back on 2lpm cann. spo2 94%

## 2016-07-26 NOTE — Consult Note (Signed)
   [facility] CM Inpatient Consult   07/26/2016  Edward [name] [birth year] [id number]    [facility] Care Management referral received for DM management and affordability of insulin. Please see inpatient DM Coordinator's notes for details. Spoke with inpatient RNCM to discuss referral. Spoke with [name] via phone to offer and discuss [place] Management program. He is agreeable and verbal consent obtained.   Lives with wife. Denies issues with transportation to MD appointments. Confirms Primary Care MD is Dr. Dena Billet. Confirms best contact number is [phone]. Confirms he has issues with affording his insulin. States he is in the doughnut hole. Could not remember the name of his pharmacy.  Discussed [facility] Care Management follow up for DM and COPD management. Also discussed referral to be made for [facility] Pharmacist for follow up regarding affordability of medications. He is slated to discharge today. [facility] referral received today.  Will refer to [place] [facility] Pharmacist.    Marthenia Rolling, [place], RN,BSN [facility] Liaison [phone]

## 2016-07-26 NOTE — Progress Notes (Signed)
Inpatient Diabetes Program Recommendations  AACE/ADA: New Consensus Statement on Inpatient Glycemic Control (2015)  Target Ranges:  Prepandial:   less than 140 mg/dL      Peak postprandial:   less than 180 mg/dL (1-2 hours)      Critically ill patients:  140 - 180 mg/dL   Results for TRYSTON, NOBIS (MRN [id number]) as of 07/26/2016 12:21  Ref. Range 07/25/2016 07:25 07/25/2016 11:11 07/25/2016 17:20 07/25/2016 21:25 07/26/2016 09:05  Glucose-Capillary Latest Ref Range: 65 - 99 mg/dL 111 (H) 131 (H) 100 (H) 145 (H) 170 (H)    Review of Glycemic Control  Diabetes history: DM2 Outpatient Diabetes medications: Levemir 70 units QHS (Not taking in over 1 month), Metformin 1000 mg BID Current orders for Inpatient glycemic control: Levemir 70 units QHS, Novolog 0-15 units TID with meals, Novolog 0-5 units QHS  Inpatient Diabetes Program Recommendations: HgbA1C: A1C 13.6% on 07/06/16 indicating an average glucose of 344 mg/dl over the past 2-3 months. A1C was done by PCP. Patient has not been taking insulin due to not able to afford it.  NOTE: Spoke with patient about diabetes and home regimen for diabetes control. Patient reports that he is followed by Dena Billet, PA at [facility] for diabetes management and he is prescribed Levemir 70 units QHS and Metformin 1000 mg BID as an outpatient for diabetes control. Patient reports that he is NOT taking Levemir as prescribed because he is not able to afford the insulin due to being in the doughnut hole.  Patient reports that he has been out of insulin for over one month and when he went to get it filled last time if was going to cost him over $200 so he could not afford to get the medication refilled.  Patient states that he checks his glucose 1 time per day (fasting) and it ranges from 100-200's mg/dl. Discussed A1C results (13.6% on 07/06/16) and explained that his current A1C indicates an average glucose of 344 mg/dl over the past 2-3 months.  Discussed glucose and A1C goals. Discussed importance of checking CBGs and maintaining good CBG control to prevent long-term and short-term complications. Explained how hyperglycemia leads to damage within blood vessels which lead to the common complications seen with uncontrolled diabetes. Stressed to the patient the importance of improving glycemic control to prevent further complications from uncontrolled diabetes. Talked with patient about the potential of using generic Novolin insulin which is $25 per vial at [facility]. Patient states that he does not think he can even afford this.   Explained to the patient he needed to be taking medication to keep his diabetes better controlled. Encouraged patient to talk with his PCP regarding samples or assistance with applying to insulin manufacture medication assistance programs to see if he can qualify to get free insulin. Also, encouraged patient to talk with his doctor about other options for diabetes control. Encouraged patient to continue checking his glucose at least once a day (more if he is able to do so) and to keep a log book of glucose readings which he will need to take to doctor appointments. Patient verbalized understanding of information discussed and he states that he has no further questions at this time related to diabetes.  MD-Patient reports that he can not afford to get Levemir due to being in the doughnut hole. Generic Novolin NPH could be purchased at [facility] for $25 per vial but patient states that he is not sure that he can even afford this because  he would need 2 vials to last him for one month (if he used NPH 35 BID to equal his Levemir dose). Placed consult for [facility] Case Manager to help with medication needs.   Thanks, Barnie Alderman, RN, MSN, CDE Diabetes Coordinator Inpatient Diabetes Program [phone] (Team Pager)

## 2016-07-27 ENCOUNTER — Encounter: Payer: Self-pay | Admitting: *Deleted

## 2016-07-27 ENCOUNTER — Other Ambulatory Visit: Payer: Self-pay | Admitting: *Deleted

## 2016-07-27 NOTE — Patient Outreach (Signed)
[facility]) Care Management  07/27/2016  Edward [name] [birth year] [id number]  Edward [name] is a 81 y.o. male with history of chronic respiratory failure on 2 L of oxygen, COPD, pulmonary fibrosis, DM, hypercholesterolemia, and hypertension who was discharged from the hospital yesterday 07/26/16 after admission for COPD exacerbation.   I reached out to Edward [name] today for transition of care assessment. Edward [name] reported that he was feeling much better but stated that he didn't have any of his new prescriptions because when they delivered last night and told him his total was > $200, he didn't have the money to pay for it. He is in the donut hole and his out of pocket cost for Levimir is $165 and out of pocket cost for Stiolto is > $35.   I called [facility] [phone]) and spoke with Janett Billow who reports that all of Edward [name] prescribed medications including Levimir and Stiolto but excluding Lancets and ASA 81mg  were delivered in a "packet" last evening. Because the packet cannot be broken and Edward [name] could not pay for the total amount, he declined the entire delivery of medications. I received a return call from the [employer] copy in follow up who reported that Edward [name] has been paying $100 out of pocket every month for his medications. She advised that our pharmacy team collaborate with the [employer] copy to see how they might be able to work with [place] to help meet Edward [name]'s medication needs.   I spoke with Karrie Meres, PharmD ([facility]) regarding my conversations with Edward [name] and Edward [name]. Dr.Ruedinger is working with Edward [name] primary care provider and will reach out to Edward [name] in follow up tomorrow.   Edward [name] indicated to me that he had checked his cbg's since returning home and they were "in the 120's and 130's". However,  Edward [name] last noted HgA1C was 13.6, 3 weeks ago.   Edward [name] last saw his PCP on October 5 and is scheduled to see her again on 07/31/16 for post hospital follow up appointment.   Plan: I asked Edward [name] if I might visit him at home for more in depth assessment of his transition of care needs and he agreed. I will see Edward [name] at home next week on Tuesday.    [facility]  [phone]

## 2016-07-31 ENCOUNTER — Ambulatory Visit (INDEPENDENT_AMBULATORY_CARE_PROVIDER_SITE_OTHER): Payer: Medicare Other | Admitting: Physician Assistant

## 2016-07-31 ENCOUNTER — Encounter: Payer: Self-pay | Admitting: Physician Assistant

## 2016-07-31 VITALS — BP 134/70 | HR 84 | Temp 97.8°F | Resp 16 | Ht 61.0 in | Wt 158.5 lb

## 2016-07-31 DIAGNOSIS — Z09 Encounter for follow-up examination after completed treatment for conditions other than malignant neoplasm: Secondary | ICD-10-CM | POA: Diagnosis not present

## 2016-07-31 DIAGNOSIS — J441 Chronic obstructive pulmonary disease with (acute) exacerbation: Secondary | ICD-10-CM

## 2016-07-31 MED ORDER — PREDNISONE 20 MG PO TABS: 20.0000 mg | ORAL_TABLET | Freq: Every day | ORAL | 0 refills | Status: DC

## 2016-07-31 NOTE — Progress Notes (Signed)
Patient ID: Edward [name] MRN: [id number], DOB: Jan 28, 1935, 81 y.o. Date of Encounter: 07/31/2016, 1:48 PM    Chief Complaint:  Chief Complaint  Patient presents with  . breathing problems     HPI: 81 y.o. year old male is here for follow-up after recent hospitalization.  I reviewed his hospital discharge summary from 07/24/2016. He was hospitalized 07/24/16 through 07/26/16. He was hospitalized with COPD exacerbation. Chest x-ray showed bronchitic changes but no pneumonia. He was treated with IV steroids, nebulizers, and IV Zithromax. Discharge summary states that he will be discharged home with another 2 days of Zithromax and 5 days of prednisone 20 mg twice a day.  Today patient states that after he was discharged he was prescribed a whole Z-Pak where he took 2 pills the first day and then 1 a day for the next 4 days. He thinks this was the only medication that he took extra after the hospital discharge. Does not think that he took the prednisone 20 mg twice a day and is unaware that he was supposed to have taken anything else in addition to the azithromycin.  He says that while he was in the hospital on treatment that he was feeling good and feeling like his breathing was improved but says that now that he is off of the treatment he feels like his symptoms are returning. Says that he feels like he is having more wheezing cough and shortness of breath than his normal baseline. Has had no fevers or chills. Has no other complaints or concerns.     Home Meds:   Outpatient Medications Prior to Visit  Medication Sig Dispense Refill  . albuterol (PROVENTIL) (2.5 MG/3ML) 0.083% nebulizer solution INHALE 1 VIAL VIA NEBULIZER EVERY 6 HOURS AS NEEDED FOR WHEEZING OR SHORTNESS OF BREATH 360 mL 5  . aspirin 81 MG EC tablet TAKE 1 TABLET BY MOUTH ONCE DAILY 30 tablet 3  . azithromycin (ZITHROMAX) 250 MG tablet Take 1 pill per day for another 2 days. 6 each 0  . budesonide (PULMICORT) 0.5  MG/2ML nebulizer solution Take 2 mLs (0.5 mg total) by nebulization 2 (two) times daily. 120 mL 5  . EASY TOUCH PEN NEEDLES 31G X 8 MM MISC USE 1 PEN NEEDLE ONCE DAILY AS DIRECTED 100 each 2  . Insulin Detemir (LEVEMIR FLEXTOUCH) 100 UNIT/ML Pen 70 units at bedtime 15 mL 11  . losartan (COZAAR) 100 MG tablet Take 1 tablet (100 mg total) by mouth daily. 30 tablet 5  . lubiprostone (AMITIZA) 24 MCG capsule Take 1 capsule (24 mcg total) by mouth 2 (two) times daily with a meal. 60 capsule 5  . metFORMIN (GLUCOPHAGE) 1000 MG tablet Take 1 tablet (1,000 mg total) by mouth 2 (two) times daily. 60 tablet 5  . Omega-3 Fatty Acids (FISH OIL) 1000 MG CAPS Take 1 capsule by mouth 2 (two) times daily.     Marland Kitchen omeprazole (PRILOSEC) 20 MG capsule Take 1 capsule (20 mg total) by mouth daily. 30 capsule 3  . ONE TOUCH ULTRA TEST test strip CHECK FASTING BLOOD SUGAR TWICE DAILY 100 each 5  . pravastatin (PRAVACHOL) 80 MG tablet Take 1 tablet (80 mg total) by mouth at bedtime. 30 tablet 5  . predniSONE (DELTASONE) 20 MG tablet Take 1 tablet (20 mg total) by mouth 2 (two) times daily with a meal. 5 tablet 0  . Tiotropium Bromide-Olodaterol (STIOLTO RESPIMAT) 2.5-2.5 MCG/ACT AERS Inhale 2 Inhalers into the lungs daily. 1 Inhaler 5  No facility-administered medications prior to visit.     Allergies:  Allergies  Allergen Reactions  . Ace Inhibitors     Hyperkalemia--07/23/2013:patient states not familiar with the following allergy      Review of Systems: See HPI for pertinent ROS. All other ROS negative.    Physical Exam: Blood pressure 134/70, pulse 84, temperature 97.8 F (36.6 C), temperature source Oral, resp. rate 16, height 5\' 1"  (1.549 m), weight 158 lb 8 oz (71.9 kg), SpO2 97 %., Body mass index is 29.95 kg/m. General:  WNWD WM. Appears in no acute distress. Neck: Supple. No thyromegaly. No lymphadenopathy. Lungs: Breath sounds are distant and decreased but are clear. I hear no wheezing. Heart:  Regular rhythm. No murmurs, rubs, or gallops. Msk:  Strength and tone normal for age. Extremities/Skin: Warm and dry. No clubbing or cyanosis. No edema. No rashes or suspicious lesions. Neuro: Alert and oriented X 3. Moves all extremities spontaneously. Gait is normal. CNII-XII grossly in tact. Psych:  Responds to questions appropriately with a normal affect.     ASSESSMENT AND PLAN:  81 y.o. year old male with  1. Hospital discharge follow-up Oxygen saturation is 97% on room air. Will have him take prednisone 20 mg daily for 5 days in addition to his usual medications. F/U  if breathing worsens or does not return to normal baseline - predniSONE (DELTASONE) 20 MG tablet; Take 1 tablet (20 mg total) by mouth daily with breakfast.  Dispense: 5 tablet; Refill: 0  2. COPD exacerbation ([name]) - predniSONE (DELTASONE) 20 MG tablet; Take 1 tablet (20 mg total) by mouth daily with breakfast.  Dispense: 5 tablet; Refill: 0   Signed, [address] [place], [place], [facility] 07/31/2016 1:48 PM

## 2016-08-01 ENCOUNTER — Other Ambulatory Visit: Payer: Self-pay | Admitting: *Deleted

## 2016-08-01 NOTE — Patient Outreach (Signed)
[facility]) Care Management  08/01/2016  Edward [name] Mar 30, 1935 [id number]  Upon arrival for our scheduled home visit today, Edward [name] and his wife were in the car in the driveway preparing to leave. Edward [name] said he'd forgotten about our appointment and wanted to reschedule.   Edward [name] saw Edward Billet PA-C yesterday and was prescribed Deltasone having reported that he was experiencing recurrence of respiratory/pulmonary symptoms.   Of note, Edward [name] did not receive all his post discharge prescribed medications (please see my note 07/28/16) because of cost concerns.   Plan: I will return a call to Edward [name] as per his request, to reschedule our appointment.  I will update our pharmacy team of today's cancelled visit.    [place] Management  [phone]

## 2016-08-02 ENCOUNTER — Encounter: Payer: Self-pay | Admitting: *Deleted

## 2016-08-02 NOTE — Telephone Encounter (Signed)
This encounter was created in error - please disregard.

## 2016-08-07 ENCOUNTER — Other Ambulatory Visit: Payer: Self-pay | Admitting: Pharmacist

## 2016-08-07 NOTE — Patient Outreach (Signed)
[facility]) Care Management  08/07/2016  Rafay HOAN ROUGH Feb 27, 1935 [id number]  Patient was referred to [place] by [facility] Liaison for evaluation of patient assistance options for medications.   Successful phone outreach to patient, he verified HIPAA details.  Discussed [employer] Extra Help program with patient---he requested [facility] Pharmacist speak with his spouse and placed her on the phone.    Discussed with patient's spouse S[place] Extra Help requirements and that manufacturer patient assistance program for Levemir requires Part D beneficiaries not be eligible for [employer] Extra Help to apply for manufacturer assistance.   Spouse reports she thinks household income and resources may meet SSA Extra Help requirements.  Suggest patient apply to [facility] Extra Help to see if he is eligible.    Patient could be heard in the background saying he didn't know if he wants to apply.  Spouse requests [facility] Pharmacist call patient back later to see if he wishes to apply for [facility] Extra Help.  Plan:  Updated [facility] RN [place] via phone on case.   Will place call back to patient in the next week.    Karrie Meres, PharmD, [place] [phone]

## 2016-08-11 ENCOUNTER — Other Ambulatory Visit: Payer: Self-pay | Admitting: Pharmacist

## 2016-08-11 ENCOUNTER — Other Ambulatory Visit: Payer: Self-pay | Admitting: *Deleted

## 2016-08-11 NOTE — Patient Outreach (Signed)
[facility]) Care Management  08/11/2016  Edward [name] 1935-01-20 [id number]  I was unable to reach Mr. Hibdon or leave a message when I attempted to contact him today for further assistance with DM management.   [place] Management Pharmacist Isabell Jarvis PharmD spoke with Mr. Mckinstry earlier today to follow up on request for assistance with medications but Mr. Moyo declined to accept help with low income subsidy or manufacturer patient assistance program applications.   Plan: I will reach out to Mr. Haq again next week to determine if he wishes to continued to engage with [place] [facility] for ongoing assistance with DM management, care coordination, and medication management and assistance.    [place] Management  [phone]

## 2016-08-11 NOTE — Patient Outreach (Signed)
[facility]) Care Management  08/11/2016  Edward [name] Jun 11, 1935 [id number]   Follow-up call to patient to see if he interested in applying to [facility] Extra Help program.  Patient answered and verified HIPAA details.    During previous phone call this week to patient, he had reported he wanted to think about it, when patient assistance options were explained to patient and patient's spouse.    Today, patient reports he has not thought about it and states "give me a few more days." He didn't wish to review his medications over the phone.    Plan:  [facility] Pharmacist will make an outreach attempt to patient next week.  If he is not interested in applying to manufacturer patient assistance programs or [employer] Extra Help will consider case closure.   Will update [facility] RN Community, Leonides Sake, PharmD, [place] [phone]

## 2016-08-14 ENCOUNTER — Other Ambulatory Visit: Payer: Self-pay | Admitting: *Deleted

## 2016-08-14 NOTE — Patient Outreach (Signed)
[place] [facility]) Care Management  08/14/2016  Edward [name] 08/20/1935 [id number]  [name] Championis a 81 y.o.malewith history of chronic respiratory failure on 2 L of oxygen, COPD, pulmonary fibrosis, DM, hypercholesterolemia, and hypertension who was discharged from the hospital yesterday 07/26/16 after admission for COPD exacerbation.   [name] was unable to meet on the day of our first scheduled face to face visit and has been unable to reschedule with me to date. Our pharmacy team has been in touch with [name] to provide assistance with medication and medication assistance programs.   I reached out to [name] again today by phone. We spoke at length about his medication and cost concerns. I explained to him that our pharmacy team is here to help with his medication questions and needs and encouraged him to return a call to Karrie Meres PharmD re: these needs.   [name] reported to me that he feels he has had the flu over the weekend. He says he has had fever and body aches and malaise. Today, he reports some improvement. He denies worsened shortness of breath, chest pain, or other new or worsened symptoms. [name] agreed to a home visit next week.   Plan:: I will speak with Dr. Jenna Luo re: a joint visit with [name] next week.    [place] Management  [phone]

## 2016-08-16 ENCOUNTER — Ambulatory Visit: Payer: Self-pay | Admitting: Pharmacist

## 2016-08-18 DIAGNOSIS — J449 Chronic obstructive pulmonary disease, unspecified: Secondary | ICD-10-CM | POA: Diagnosis not present

## 2016-08-22 ENCOUNTER — Other Ambulatory Visit: Payer: Self-pay | Admitting: *Deleted

## 2016-08-22 ENCOUNTER — Other Ambulatory Visit: Payer: Self-pay | Admitting: Pharmacist

## 2016-08-22 ENCOUNTER — Encounter: Payer: Self-pay | Admitting: *Deleted

## 2016-08-22 NOTE — Patient Outreach (Signed)
[facility]) Care Management  [place]   08/22/2016  Edward [name] 02-Jul-1935 [id number]  Subjective:  Home visit completed with [place].  Patient was referred to [place] for evaluation of patient assistance and during previous phone call with patient, he had wanted to think about his options.    His past medical history is significant for hypertension, COPD, type 2 diabetes mellitus, hyperlipidemia.   He expresses concern with cost of his insulin.  Patient was willing to review his medications during home visit and discuss patient assistance.    When discussing patient assistance options today, patient states 'I'll think about it."    Objective:   Encounter Medications: Outpatient Encounter Prescriptions as of 08/22/2016  Medication Sig Note  . albuterol (PROVENTIL) (2.5 MG/3ML) 0.083% nebulizer solution INHALE 1 VIAL VIA NEBULIZER EVERY 6 HOURS AS NEEDED FOR WHEEZING OR SHORTNESS OF BREATH   . aspirin 81 MG EC tablet TAKE 1 TABLET BY MOUTH ONCE DAILY   . losartan (COZAAR) 100 MG tablet Take 1 tablet (100 mg total) by mouth daily.   . metFORMIN (GLUCOPHAGE) 1000 MG tablet Take 1 tablet (1,000 mg total) by mouth 2 (two) times daily.   . pravastatin (PRAVACHOL) 80 MG tablet Take 1 tablet (80 mg total) by mouth at bedtime.   Marland Kitchen PROAIR RESPICLICK 123XX123 (90 Base) MCG/ACT AEPB Inhale 108 mcg into the lungs 3 (three) times daily. 07/31/2016: Received from: External Pharmacy  . azithromycin (ZITHROMAX) 250 MG tablet Take 1 pill per day for another 2 days. (Patient not taking: Reported on 08/22/2016) 08/22/2016: States he completed course.    . budesonide (PULMICORT) 0.5 MG/2ML nebulizer solution Take 2 mLs (0.5 mg total) by nebulization 2 (two) times daily. (Patient not taking: Reported on 08/22/2016)   . EASY TOUCH PEN NEEDLES 31G X 8 MM MISC USE 1 PEN NEEDLE ONCE DAILY AS DIRECTED   . Insulin Detemir (LEVEMIR FLEXTOUCH) 100 UNIT/ML Pen 70 units at  bedtime (Patient not taking: Reported on 08/22/2016)   . lubiprostone (AMITIZA) 24 MCG capsule Take 1 capsule (24 mcg total) by mouth 2 (two) times daily with a meal. (Patient not taking: Reported on 08/22/2016)   . Omega-3 Fatty Acids (FISH OIL) 1000 MG CAPS Take 1 capsule by mouth 2 (two) times daily.    Marland Kitchen omeprazole (PRILOSEC) 20 MG capsule Take 1 capsule (20 mg total) by mouth daily. (Patient not taking: Reported on 08/22/2016)   . ONE TOUCH ULTRA TEST test strip CHECK FASTING BLOOD SUGAR TWICE DAILY   . predniSONE (DELTASONE) 20 MG tablet Take 1 tablet (20 mg total) by mouth 2 (two) times daily with a meal. (Patient not taking: Reported on 08/22/2016)   . predniSONE (DELTASONE) 20 MG tablet Take 1 tablet (20 mg total) by mouth daily with breakfast.   . Tiotropium Bromide-Olodaterol (STIOLTO RESPIMAT) 2.5-2.5 MCG/ACT AERS Inhale 2 Inhalers into the lungs daily. (Patient not taking: Reported on 08/22/2016)    No facility-administered encounter medications on file as of 08/22/2016.     Functional Status: In your present state of health, do you have any difficulty performing the following activities: 08/14/2016 07/27/2016  Hearing? N -  Vision? Y -  Difficulty concentrating or making decisions? N -  Walking or climbing stairs? Y -  Dressing or bathing? N -  Doing errands, shopping? N -  Preparing Food and eating ? N -  Using the Toilet? N -  In the past six months, have you accidently  leaked urine? N -  Do you have problems with loss of bowel control? N -  Managing your Medications? Y Y  Managing your Finances? Tempie Donning  Housekeeping or managing your Housekeeping? Y -  Some recent data might be hidden    Fall/Depression Screening: PHQ 2/9 Scores 07/27/2016 03/16/2016 02/09/2015 12/01/2013  PHQ - 2 Score 0 0 1 0    Assessment:  Per patient report, he is only taking the below medications:   Drugs sorted by system:  Cardiovascular: -aspirin 81 mg -losartan -pravastatin    Pulmonary/Allergy: -albuterol nebs -albuterol inhaler  Endocrine: -metformin    Other issues noted:  Medications on active medication list patient reports he is not taking: -budesonide nebs -Levemir -Amitiza -Fish oil -omeprazole -Stiolto (tiotropium/olodaterol)   He reports he completed prednisone and azithromycin.    Medication assistance: Patient reports he thinks household income may meet [place] Extra Help requirements.  Manufacturer patient assistance programs [employer] and [employer]) require Medicare Part D Beneficiaries to be denied SSA Extra Help.    Counseled patient would recommend he apply to [facility] Extra Help to see if he is eligible---he can apply via phone, online, or at local [facility] office.  He can also call SSA and request a paper application be mailed to him.    With regards to his COPD regimen, he reports using albuterol nebs and albuterol inhaler only.  He does not have Stiolto (secondary to cost).   If therapeutically appropriate, consideration could be given to ipratropium 0.02% nebs (25 x 2.5 mL vials) are ~$4/box.    Plan:  Will follow-up via phone next week with patient to see if he is interested in [place] Extra Help.    Will route this note to PCP and continue to collaborate with [place] on patient's case.   Karrie Meres, PharmD, [place] [phone]

## 2016-08-22 NOTE — Patient Outreach (Signed)
[facility]) Care Management   08/22/2016  Zandyr CORI WAISNER 06-09-1935 [id number]  Edward [name] is an 81 y.o. male with history of chronic respiratory failure on 2 L of oxygen, COPD, pulmonary fibrosis, DM, hypercholesterolemia, and hypertensionwho was discharged from the hospital yesterday 07/26/16 after admission for COPD exacerbation.   Mr. Ley was unable to meet on the day of our first scheduled face to face visit. He reported having the flu last week but agreed to an appointment with me and with [facility] CM Pharmacist Karrie Meres PharmD today at his home. Mr. Gorospe wife is present and we are meeting to address COPD and DM disease management and medication management and assistance.   Subjective: "I'm out of those shots and that's why my sugar has been high"  Objective:  BP 140/80   Pulse (!) 103   SpO2 98%   Review of Systems  Constitutional: Negative.   HENT: Negative.   Eyes: Negative.   Respiratory: Positive for cough and sputum production. Negative for shortness of breath and wheezing.        Intermittent cough sometimes productive of light yellow sputum  Cardiovascular: Negative.   Gastrointestinal: Negative.   Genitourinary: Negative.   Musculoskeletal: Negative.   Skin: Negative.   Neurological: Negative.   Psychiatric/Behavioral: Negative.     Physical Exam  Constitutional: He is oriented to person, place, and time. Vital signs are normal. He appears well-developed and well-nourished. He is active. He has a sickly appearance. He does not appear ill.  Cardiovascular: Normal rate and regular rhythm.   Respiratory: Effort normal and breath sounds normal. No respiratory distress. He has no wheezes. He has no rhonchi. He has no rales.  GI: Soft. Bowel sounds are normal.  Neurological: He is alert and oriented to person, place, and time.  Skin: Skin is warm and dry.  Psychiatric: He has a normal mood and affect. His speech is normal and  behavior is normal. Judgment and thought content normal. Cognition and memory are normal.    Encounter Medications:   Outpatient Encounter Prescriptions as of 08/22/2016  Medication Sig Note  . albuterol (PROVENTIL) (2.5 MG/3ML) 0.083% nebulizer solution INHALE 1 VIAL VIA NEBULIZER EVERY 6 HOURS AS NEEDED FOR WHEEZING OR SHORTNESS OF BREATH   . aspirin 81 MG EC tablet TAKE 1 TABLET BY MOUTH ONCE DAILY   . azithromycin (ZITHROMAX) 250 MG tablet Take 1 pill per day for another 2 days.   . budesonide (PULMICORT) 0.5 MG/2ML nebulizer solution Take 2 mLs (0.5 mg total) by nebulization 2 (two) times daily.   Marland Kitchen EASY TOUCH PEN NEEDLES 31G X 8 MM MISC USE 1 PEN NEEDLE ONCE DAILY AS DIRECTED   . Insulin Detemir (LEVEMIR FLEXTOUCH) 100 UNIT/ML Pen 70 units at bedtime   . losartan (COZAAR) 100 MG tablet Take 1 tablet (100 mg total) by mouth daily.   Marland Kitchen lubiprostone (AMITIZA) 24 MCG capsule Take 1 capsule (24 mcg total) by mouth 2 (two) times daily with a meal.   . metFORMIN (GLUCOPHAGE) 1000 MG tablet Take 1 tablet (1,000 mg total) by mouth 2 (two) times daily.   . Omega-3 Fatty Acids (FISH OIL) 1000 MG CAPS Take 1 capsule by mouth 2 (two) times daily.    Marland Kitchen omeprazole (PRILOSEC) 20 MG capsule Take 1 capsule (20 mg total) by mouth daily.   . ONE TOUCH ULTRA TEST test strip CHECK FASTING BLOOD SUGAR TWICE DAILY   . pravastatin (PRAVACHOL) 80 MG tablet Take 1 tablet (  80 mg total) by mouth at bedtime.   . predniSONE (DELTASONE) 20 MG tablet Take 1 tablet (20 mg total) by mouth 2 (two) times daily with a meal.   . predniSONE (DELTASONE) 20 MG tablet Take 1 tablet (20 mg total) by mouth daily with breakfast.   . PROAIR RESPICLICK 123XX123 (90 Base) MCG/ACT AEPB Inhale 108 mcg into the lungs 3 (three) times daily. 07/31/2016: Received from: External Pharmacy  . Tiotropium Bromide-Olodaterol (STIOLTO RESPIMAT) 2.5-2.5 MCG/ACT AERS Inhale 2 Inhalers into the lungs daily.    Assessment:  81 year old gentleman living in  [place], [place] with his wife. Mr. Lauren was hospitalized from 10/23-10/25 with a COPD exacerbation. He has poorly controlled DM with his last noted HgA1c = 13.6 (07/06/16). He has knowledge deficits related to COPD and DM disease management, medication management, and barriers related to medication costs.   Medication Management/Financial Constraints - Mr. Swett's medication management and assistance needs are being addressed today by Karrie Meres PharmD [facility] CM). Please refer to Dr. Pennie Rushing note.   Chronic Health Condition (COPD) - Mr. Krotzer was last hospitalized with COPD exacerbation in October. He is not a smoker and states he has not smoked in "4 or 5 years". He is not taking his medications as prescribed but is receiving counseling and assistance with pharmacy needs as outlined above. He has significant knowledge deficits related to disease process and does not have a COPD action plan. I provided print educational materials (Emmi), a COPD action plan magnet for his refrigerator, and reviewed all of these materials in detail with him today.   Breath sounds today are   Chronic Health Condition (DM) - Mr. Woodson's last HgA1c was 13.7 in October. His A1c has been 13.6 or greater X > 1 year. He has not been checking his cbg's regularly and has not been taking insulin. Dr. Jenna Luo is helping [name] with medication assistance programs and is collaborating with Mr. Jimeno primary care provider. He has significant knowledge deficits related to prescribed carb modified diet and does not have a diabetes action plan. I provided print educational materials [facility]) and reviewed with him today.   CBG Averages: 7 day (n=1) =498 14 day (n=2) = 506 30 day (n=16) = 315  Mr. Albo last saw [name], his primary care provider, on 07/31/16.   Plan:   Mr. Bedoy will begin checking his cbg's daily as prescribed.   Mr. Banh will work with [place] [facility] pharmacy  team on medication management and medication assistance programs.   I will reach out to Mr. Blaize again by phone next week as he is in our transition of care program.   I will see Mr. Vasas at home again next month for a face to face visit to continue our collaboration on his COPD and DM management.    [place] Management  [phone]

## 2016-08-28 ENCOUNTER — Telehealth: Payer: Self-pay

## 2016-08-28 ENCOUNTER — Other Ambulatory Visit: Payer: Self-pay | Admitting: *Deleted

## 2016-08-28 MED ORDER — IPRATROPIUM BROMIDE 0.02 % IN SOLN: 0.5000 mg | Freq: Four times a day (QID) | RESPIRATORY_TRACT | 12 refills | Status: DC

## 2016-08-28 NOTE — Telephone Encounter (Signed)
Rx Ipratropium filled

## 2016-08-28 NOTE — Patient Outreach (Signed)
[facility]) Care Management  08/28/2016  Edward [name] 09/25/1935 [id number]  Edward [name] is an 81 y.o. male with history of chronic respiratory failure on 2 L of oxygen, COPD, pulmonary fibrosis, DM, hypercholesterolemia, and hypertension who was discharged from the hospital on 07/26/16 after admission for COPD exacerbation.    Edward [name] met with me and with [facility] CM Pharmacist Karrie Meres PharmD at his home last week for our initial home visit. Edward [name] wife was present and we met to address COPD and DM disease management and medication management and assistance.  I am seeing Edward [name] at home today to follow up on cbg's and to further discuss medication assistance needs.   Subjective: "I don't feel bad but I know my sugar isn't supposed to be this high"  Objective:   Fasting CBG today: 421  CBG findings:  Last recorded fasting findings while patient was taking Levimir 70u qhs: 299, 146, 215, 96, 104, 152, 139, 123, 103, 203, 124, 96, 144, 109, 140, 85, 99  Recorded findings from last week while taking no insulin:  401, 330, 514, 509, 382, 421  Assessment: 81 year old gentleman living in [place], [place] with his wife. Edward [name] was hospitalized from 10/23-10/25 with a COPD exacerbation. He has poorly controlled DM with his last noted HgA1c = 13.6 (07/06/16). He has knowledge deficits related to COPD and DM disease management, medication management, and barriers related to medication costs.    Medication Management/Financial Constraints - Edward [name] medication management and assistance needs are being addressed by Karrie Meres PharmD [facility] CM). Please refer to Dr. Pennie Rushing note.   During our visit last week, Edward [name] reported NOT TAKING the following prescribed medications:   -budesonide nebs -Levemir -Amitiza -Fish oil -omeprazole -Stiolto (tiotropium/olodaterol)   Dr. Jenna Luo explained the process for low income subsidy  ("LIS" or "extra help") application to Edward [name] and his wife in detail during our visit on 08/22/16. Edward [name] said he wanted to think about whether he wished to complete the required social security application and today, he told me he did not wish to complete the social security application. Therefore, he will not be able to qualify for LIS or any drug manufacturer assistance programs.   I have updated Dr. Jenna Luo and Edward [name] primary care provider, Orlena Sheldon PA--C.     Chronic Health Condition (DM) - Edward [name] last HgA1c was 13.7 in October. His A1c has been 13.6 or greater X > 1 year. He has not been checking his cbg's regularly and has not been taking insulin.  He has significant knowledge deficits related to prescribed carb modified diet and does not have a diabetes action plan. I provided print educational materials [facility]) and reviewed with him today.    CBG Averages (during last week's visit): 7 day (n=1) =498 14 day (n=2) = 506 30 day (n=16) = 315   Edward [name] last saw [name], his primary care provider, on 07/31/16.    Plan:    Edward [name] will continue checking his cbg's daily as prescribed.    Edward [name] will work with [facility] pharmacy team on medication management.     I will reach out to Edward [name] again by phone after follow up discussion about today's findings with Orlena Sheldon PA-C and Karrie Meres PharmD, BCACP   I will see Edward [name] at home again next month for a face to face visit to continue our  collaboration on his COPD and DM management.      [facility]  [phone]

## 2016-09-01 ENCOUNTER — Other Ambulatory Visit: Payer: Self-pay | Admitting: Pharmacist

## 2016-09-01 NOTE — Patient Outreach (Addendum)
[facility]) Care Management  09/01/2016  Edward [name] [birth year] [id number]  1011:  Follow-up call to patient.   Person who answered phone stated patient was not available and requested [facility] Pharmacist call back in the next 30-60 minutes.  No PHI exchanged.    1052:  Successful phone outreach to patient.  HIPAA verified.  Patient confirmed he does not wish to apply to [facility] Extra Help.  He is aware that this limits his eligibility for manufacturer patient assistance programs as both [employer] and [employer] patient assistance programs require Part D beneficiaries not be eligible for [facility] Extra Help.   Patient states he knows his PCP office prescribed ipratropium nebulization and he states it is more affordable than Stiolto inhaler he previously used.   He confirms he has a working nebulizer.    Patient verified he understands he can apply to [facility] Extra Help if he wishes to later and he verbalized understanding that he would need to apply to patient assistance and [employer] Extra Help in order to get any assistance with his medication costs, which he does not wish to apply for.    Plan:  Will close pharmacy case.   Will route this note to PCP.  Will send note to [facility], to update on pharmacy case closure.   Patient aware he can contact [place] if he wishes to apply for patient assistance in the future.    Karrie Meres, PharmD, [place] [phone]

## 2016-09-02 ENCOUNTER — Encounter (HOSPITAL_COMMUNITY): Payer: Self-pay | Admitting: Emergency Medicine

## 2016-09-02 ENCOUNTER — Emergency Department (HOSPITAL_COMMUNITY)
Admission: EM | Admit: 2016-09-02 | Discharge: 2016-09-02 | Disposition: A | Discharge status: Home / Self Care | Payer: Medicare Other | Attending: Emergency Medicine | Admitting: Emergency Medicine

## 2016-09-02 DIAGNOSIS — E119 Type 2 diabetes mellitus without complications: Secondary | ICD-10-CM | POA: Insufficient documentation

## 2016-09-02 DIAGNOSIS — Z79899 Other long term (current) drug therapy: Secondary | ICD-10-CM | POA: Insufficient documentation

## 2016-09-02 DIAGNOSIS — I10 Essential (primary) hypertension: Secondary | ICD-10-CM | POA: Diagnosis not present

## 2016-09-02 DIAGNOSIS — J9610 Chronic respiratory failure, unspecified whether with hypoxia or hypercapnia: Secondary | ICD-10-CM | POA: Insufficient documentation

## 2016-09-02 DIAGNOSIS — Z7982 Long term (current) use of aspirin: Secondary | ICD-10-CM | POA: Insufficient documentation

## 2016-09-02 DIAGNOSIS — J449 Chronic obstructive pulmonary disease, unspecified: Secondary | ICD-10-CM | POA: Diagnosis not present

## 2016-09-02 DIAGNOSIS — Z794 Long term (current) use of insulin: Secondary | ICD-10-CM | POA: Diagnosis not present

## 2016-09-02 DIAGNOSIS — R062 Wheezing: Secondary | ICD-10-CM | POA: Diagnosis not present

## 2016-09-02 DIAGNOSIS — H01001 Unspecified blepharitis right upper eyelid: Secondary | ICD-10-CM | POA: Diagnosis not present

## 2016-09-02 DIAGNOSIS — Z87891 Personal history of nicotine dependence: Secondary | ICD-10-CM | POA: Diagnosis not present

## 2016-09-02 DIAGNOSIS — R220 Localized swelling, mass and lump, head: Secondary | ICD-10-CM | POA: Diagnosis present

## 2016-09-02 MED ORDER — TOBRAMYCIN 0.3 % OP SOLN
2.0000 [drp] | Freq: Once | OPHTHALMIC | Status: AC
  Administered 2016-09-02: 2 [drp] via OPHTHALMIC
  Filled 2016-09-02: qty 5

## 2016-09-02 NOTE — ED Triage Notes (Signed)
Patient c/o swelling to right eyelid x2 weeks. Per patient clear watery drainage. Denies any fevers. Per patient some blurred. Denies using any OTC medications.

## 2016-09-02 NOTE — ED Provider Notes (Signed)
[facility] DEPT Provider Note   CSN: [id number] Arrival date & time: 09/02/16  1622     History   Chief Complaint Chief Complaint  Patient presents with  . Belepharitis    HPI Edward [name] is a 81 y.o. male.  Patient is a 81 year old male who presents to the emergency department with a complaint of eye swelling.  The patient states that he's had problems with his upper eyelid swelling for nearly 2 weeks. He states that he has a sensation sometimes that there is something underneath the lid and irritating it. He has noted some drainage. Sometime it is clear watery, other times it has a milky color to it. He's not had any injury to the eye. No operations or procedures involving the eye. He does not recall any thing biting his right eye, and is not been in any accidents recently. He has no history of glaucoma. It is of note that he had eye infection sometime in the past, but he does not recall any of the facts concerning this particular incident. He presents to the emergency department at this time for assistance with this issue.      Past Medical History:  Diagnosis Date  . Allergy    Rhinitis  . Bronchitis   . Chronic respiratory failure ([place])   . Colon polyps   . COPD (chronic obstructive pulmonary disease) ([place])   . Diabetes mellitus   . Elevated lipids   . Hypercholesterolemia   . Hypertension   . On home O2    2L N/C   . PSA elevation   . Pulmonary fibrosis ([place])   . Vitamin D deficiency     Patient Active Problem List   Diagnosis Date Noted  . Non compliance w medication regimen 12/02/2015  . Preseptal cellulitis 05/09/2015  . Other and unspecified hyperlipidemia 05/12/2014  . Elevated LFTs 05/12/2014  . Foot laceration 05/12/2014  . Dyspnea 12/19/2013  . Acute on chronic respiratory failure with hypoxia ([place]) 11/17/2013  . Diastolic dysfunction 123456  . COPD exacerbation ([place]) 11/15/2013  . Paroxysmal atrial fibrillation ([place]) 11/10/2013  .  Elevated PSA 01/20/2013  . Diabetes mellitus type 2, uncontrolled ([place])   . COPD (chronic obstructive pulmonary disease) ([place])   . Hypertension   . Allergy   . Elevated lipids   . Pulmonary fibrosis ([place])   . Bronchitis   . Colon polyps   . Colon polyps     Past Surgical History:  Procedure Laterality Date  . CATARACT EXTRACTION W/PHACO  06/25/2012   Procedure: CATARACT EXTRACTION PHACO AND INTRAOCULAR LENS PLACEMENT (IOC);  Surgeon: Elta Guadeloupe T. Gershon Crane, MD;  Location: [facility] OR[facility];  Service: Ophthalmology;  Laterality: Left;  CDE=19.01  . CATARACT EXTRACTION W/PHACO  07/09/2012   Procedure: CATARACT EXTRACTION PHACO AND INTRAOCULAR LENS PLACEMENT (IOC);  Surgeon: Elta Guadeloupe T. Gershon Crane, MD;  Location: [facility];  Service: Ophthalmology;  Laterality: Right;  CDE: 20.09       Home Medications    Prior to Admission medications   Medication Sig Start Date End Date Taking? Authorizing Provider  albuterol (PROVENTIL) (2.5 MG/3ML) 0.083% nebulizer solution INHALE 1 VIAL VIA NEBULIZER EVERY 6 HOURS AS NEEDED FOR WHEEZING OR SHORTNESS OF BREATH 12/28/15  Yes Orlena Sheldon, PA-C  aspirin 81 MG EC tablet TAKE 1 TABLET BY MOUTH ONCE DAILY 08/19/15  Yes Lonie Peak Dixon, PA-C  budesonide (PULMICORT) 0.5 MG/2ML nebulizer solution Take 2 mLs (0.5 mg total) by nebulization 2 (two) times daily. 11/19/15  Yes Stanton Kidney  B Dixon, PA-C  ipratropium (ATROVENT) 0.02 % nebulizer solution Take 2.5 mLs (0.5 mg total) by nebulization 4 (four) times daily. 08/28/16  Yes Mary B Dixon, PA-C  losartan (COZAAR) 100 MG tablet Take 1 tablet (100 mg total) by mouth daily. 03/13/16  Yes Mary B Dixon, PA-C  metFORMIN (GLUCOPHAGE) 1000 MG tablet Take 1 tablet (1,000 mg total) by mouth 2 (two) times daily. 03/13/16  Yes Mary B Dixon, PA-C  omeprazole (PRILOSEC) 20 MG capsule Take 1 capsule (20 mg total) by mouth daily. 03/23/16  Yes Mary B Dixon, PA-C  pravastatin (PRAVACHOL) 80 MG tablet Take 1 tablet (80 mg total) by mouth at bedtime. 03/13/16  Yes Mary B  Dixon, PA-C  PROAIR RESPICLICK [id number] (90 Base) MCG/ACT AEPB Inhale 108 mcg into the lungs 3 (three) times daily. 07/21/16  Yes Historical Provider, MD  Tiotropium Bromide-Olodaterol (STIOLTO RESPIMAT) 2.5-2.5 MCG/ACT AERS Inhale 2 Inhalers into the lungs daily. 12/29/14  Yes Susy Frizzle, MD  Insulin Detemir (LEVEMIR FLEXTOUCH) 100 UNIT/ML Pen 70 units at bedtime Patient not taking: Reported on 09/02/2016 07/26/16   Orvan Falconer, MD    Family History Family History  Problem Relation Age of Onset  . Heart disease Mother   . CAD Other   . Diabetes Other     Social History Social History  Substance Use Topics  . Smoking status: Former Smoker    Packs/day: 1.50    Years: 60.00    Types: Cigarettes    Quit date: 12/31/2012  . Smokeless tobacco: Never Used  . Alcohol use No     Allergies   Ace inhibitors   Review of Systems Review of Systems  Eyes: Positive for discharge and itching.       Blurring vision  Respiratory: Positive for wheezing.   All other systems reviewed and are negative.    Physical Exam Updated Vital Signs BP 152/66 (BP Location: Left Arm)   Pulse 102   Temp 98.1 F (36.7 C) (Oral)   Resp 20   Ht 5\' 6"  (1.676 m)   Wt 72.6 kg   SpO2 91%   BMI 25.82 kg/m   Physical Exam  Constitutional: He is oriented to person, place, and time. He appears well-developed and well-nourished.  Non-toxic appearance.  HENT:  Head: Normocephalic.  Right Ear: Tympanic membrane and external ear normal.  Left Ear: Tympanic membrane and external ear normal.  There is mild purulent drainage from the nasal corner of the right eye. The upper lid is swollen. Examination reveals no foreign body under the lid. Extraocular movements are intact. The anterior chamber is clear. No foreign body noted under the lower lid. Could not visualize the disc on, but no H date or hemorrhage appreciated on funduscopic examination.  No increased redness of the right side of the face, or sign of  cellulitis.  Eyes: EOM and lids are normal. Pupils are equal, round, and reactive to light. Right eye exhibits discharge.  Neck: Normal range of motion. Neck supple. Carotid bruit is not present.  Cardiovascular: Normal rate, regular rhythm, intact distal pulses and normal pulses.   Murmur heard. Pulmonary/Chest: Breath sounds normal. No respiratory distress.  Soft expiratory wheezes noted on the right and left lung fields.  Abdominal: Soft. Bowel sounds are normal. There is no tenderness. There is no guarding.  Musculoskeletal: Normal range of motion.  Lymphadenopathy:       Head (right side): No submandibular adenopathy present.       Head (left side):  No submandibular adenopathy present.    He has no cervical adenopathy.  Neurological: He is alert and oriented to person, place, and time. He has normal strength. No cranial nerve deficit or sensory deficit.  Skin: Skin is warm and dry.  Psychiatric: He has a normal mood and affect. His speech is normal.  Nursing note and vitals reviewed.    ED Treatments / Results  Labs (all labs ordered are listed, but only abnormal results are displayed) Labs Reviewed - No data to display  EKG  EKG Interpretation None       Radiology No results found.  Procedures Procedures (including critical care time)  Medications Ordered in ED Medications  tobramycin (TOBREX) 0.3 % ophthalmic solution 2 drop (not administered)     Initial Impression / Assessment and Plan / ED Course  I have reviewed the triage vital signs and the nursing notes.  Pertinent labs & imaging results that were available during my care of the patient were reviewed by me and considered in my medical decision making (see chart for details).  Clinical Course   Pt seen with me by Dr Leonette Monarch.  **I have reviewed nursing notes, vital signs, and all appropriate lab and imaging results for this patient.*  Final Clinical Impressions(s) / ED Diagnoses  I've discussed with  the patient the findings on examination. The plan at this time is for the patient to use warm compresses 4 times daily to the eye. In addition the patient is given tobramycin ophthalmic drops to use every 4 hours for the next 5 days. The patient will see Mrs. Dixon, his primary clinician for ophthalmology evaluation, or return to the emergency department if not improving.    Final diagnoses:  Blepharitis of right upper eyelid, unspecified type    New Prescriptions New Prescriptions   No medications on file     Lily Kocher, PA-C 09/02/16 [place], MD 09/03/16 [phone]

## 2016-09-02 NOTE — Discharge Instructions (Signed)
Please apply warm compresses to your right eye 4 times daily until cleared. Please use 2 drops of tobramycin ophthalmic drops to the right eye every 4 hours for the next 5 days. Please see Miss Doren Custard for ophthalmology referral , or return to the emergency department if not improving. Wash hands frequently.

## 2016-09-07 ENCOUNTER — Other Ambulatory Visit: Payer: Self-pay | Admitting: *Deleted

## 2016-09-08 NOTE — Patient Outreach (Signed)
Transition of care call. I spoke with Mr. Bellmore today. He has been to the ED on 09/02/16 for an eye infection. He reports this is now much better. I told him that I see Janalyn Shy, RN, has been following him and talking to him about his diabetes. I asked what his glucose was today and he said 444 mg/DL! I asked him if it always runs that high and he responded, "Yes, because I don't have any insulin." I asked him about the suggestion that was given to him by our pharmacist, Berniece Pap, to apply for Social Security LIS and he says, "He ain't gonna do that, cause they aren't going to take away any of my social security. I explained to him that is NOT what happens. I explained that if he is financially eligible he can get an extra benefit that will lower his medication costs significantly and it will not cost him anything extra. I also explained to him that he is in control of his diabetes management. He is having a lot of damage being done to his body (eyes, kidneys, feet, nerves) but not controlling his glucose. I told him it would be ashame to to not take advantage of this program that can help you, so you can help yourself. I told him we work with people all the time that have lost their site, legs and have to be on dialysis because their kidneys have failed due to uncontrolled diabetes.   Mr. Cupo did seem to listen toward the end of our conversation, but he needs a lot of encouragement going forward to take care of himself. I am hopeful with additional attention from Janalyn Shy, he will take the initiative to help himself!  Deloria Lair [facility] [phone]

## 2016-09-14 ENCOUNTER — Other Ambulatory Visit: Payer: Self-pay | Admitting: *Deleted

## 2016-09-14 NOTE — Patient Outreach (Signed)
[facility]) Care Management  09/14/2016  Khole ELGIN AGUAYO 21-Feb-1935 [id number]  I spoke with Mr. Crafts by phone today. He reports that he still doesn't want to apply for LIS via social security evaluation/application. He says he understands this excludes him from patient assistance for diabetes medications through drug manufacturers. He continues to check his cbg's daily and says they remain anywhere between "the 200's and the 300's". He denies symptoms related to hyperglycemia and denies that he has had any episodes of known hypoglycemia.   Plan: I will follow up with [name] re: Mr. Braunstein ongoing DM management needs.    [facility]  [phone]

## 2016-09-17 DIAGNOSIS — J449 Chronic obstructive pulmonary disease, unspecified: Secondary | ICD-10-CM | POA: Diagnosis not present

## 2016-09-22 ENCOUNTER — Other Ambulatory Visit: Payer: Self-pay | Admitting: *Deleted

## 2016-09-22 NOTE — Patient Outreach (Signed)
[facility]) Care Management  09/22/2016  Edward [name] 09-05-1935 [id number]  Unable to reach [name] by phone today.   Plan: I will reach out to [name] again next week to follow up on his DM management and medication management needs.    [facility]  [phone]

## 2016-09-27 ENCOUNTER — Other Ambulatory Visit: Payer: Self-pay | Admitting: *Deleted

## 2016-09-27 NOTE — Patient Outreach (Signed)
[facility]) Care Management  09/27/2016  Edward [name] [birth year] [id number]  Edward [name] is an 81 y.o. male with history of chronic respiratory failure on 2 L of oxygen, COPD, pulmonary fibrosis, DM, hypercholesterolemia, and hypertensionwho was discharged from the hospital on 07/26/16 after admission for COPD exacerbation.   Edward [name] met with me and with [facility] CM Pharmacist Karrie Meres PharmD at his home to review options for medication assistance. He would qualify for assistance with medications (especially Insulin) but he doesn't want to apply for LIS via social security evaluation/application. He says he understands this excludes him from patient assistance for diabetes medications through drug manufacturers. He continues to check his cbg's daily and says they remain anywhere between "the 200's and the 300's".   I reached out to the office of Karis Juba PA today and left a message re: the information above and notifying them of my faxed/in basket note to Ms. Dixon and that Edward [name] is NOT taking insulin.   Plan:   Edward [name] is scheduled to see Ms. Dixon in the office on 10/12/15.   I will follow up with Edward [name] by phone after his appointment with Ms. Dixon.    [facility]  [phone]

## 2016-10-11 ENCOUNTER — Encounter: Payer: Self-pay | Admitting: Physician Assistant

## 2016-10-11 ENCOUNTER — Ambulatory Visit (INDEPENDENT_AMBULATORY_CARE_PROVIDER_SITE_OTHER): Payer: Medicare Other | Admitting: Physician Assistant

## 2016-10-11 VITALS — BP 148/70 | HR 89 | Temp 98.1°F | Resp 16 | Wt 157.0 lb

## 2016-10-11 DIAGNOSIS — Z9114 Patient's other noncompliance with medication regimen: Secondary | ICD-10-CM | POA: Diagnosis not present

## 2016-10-11 DIAGNOSIS — J441 Chronic obstructive pulmonary disease with (acute) exacerbation: Secondary | ICD-10-CM | POA: Diagnosis not present

## 2016-10-11 DIAGNOSIS — IMO0001 Reserved for inherently not codable concepts without codable children: Secondary | ICD-10-CM

## 2016-10-11 DIAGNOSIS — E1159 Type 2 diabetes mellitus with other circulatory complications: Secondary | ICD-10-CM | POA: Insufficient documentation

## 2016-10-11 DIAGNOSIS — E1165 Type 2 diabetes mellitus with hyperglycemia: Secondary | ICD-10-CM | POA: Diagnosis not present

## 2016-10-11 DIAGNOSIS — J439 Emphysema, unspecified: Secondary | ICD-10-CM | POA: Diagnosis not present

## 2016-10-11 DIAGNOSIS — Z794 Long term (current) use of insulin: Secondary | ICD-10-CM | POA: Diagnosis not present

## 2016-10-11 DIAGNOSIS — I10 Essential (primary) hypertension: Secondary | ICD-10-CM | POA: Diagnosis not present

## 2016-10-11 LAB — COMPLETE METABOLIC PANEL WITH GFR
ALT: 12 U/L (ref 9–46)
AST: 11 U/L (ref 10–35)
Albumin: 3.6 g/dL (ref 3.6–5.1)
Alkaline Phosphatase: 87 U/L (ref 40–115)
BILIRUBIN TOTAL: 0.2 mg/dL (ref 0.2–1.2)
BUN: 25 mg/dL (ref 7–25)
CO2: 26 mmol/L (ref 20–31)
CREATININE: 1.03 mg/dL (ref 0.70–1.18)
Calcium: 9.2 mg/dL (ref 8.6–10.3)
Chloride: 92 mmol/L — ABNORMAL LOW (ref 98–110)
GFR, Est African American: 79 mL/min (ref >=60)
GFR, Est Non African American: 69 mL/min (ref >=60)
Glucose, Bld: 544 mg/dL (ref 70–99)
Potassium: 4.8 mmol/L (ref 3.5–5.3)
Sodium: 127 mmol/L — ABNORMAL LOW (ref 135–146)
TOTAL PROTEIN: 6.3 g/dL (ref 6.1–8.1)

## 2016-10-11 MED ORDER — PREDNISONE 20 MG PO TABS: ORAL_TABLET | ORAL | 0 refills | Status: DC

## 2016-10-11 NOTE — Progress Notes (Signed)
Patient ID: Edward [name] MRN: [id number], DOB: [birth year], 82 y.o. Date of Encounter: @DATE @  Chief Complaint:  Chief Complaint  Patient presents with  . Hypertension    f/u  . Diabetes    f/u  . Sore Throat  . wheezing    HPI: 82 y.o. year old male  presents for routine followup office visit.    At office visit with me 10/30/13 at which time we stopped multiple oral medicines and started Lantus. On 11/04/13 he had an ER visit with COPD. On 11/06/13 he had another ER visit with COPD. 11/10/2013 he had office visit with me to followup with COPD. 11/15/13 he was hospitalized with COPD. 12/01/13 have followup office visit with me regarding his COPD. 12/08/13 he had another followup office visit with me.  In the past I had placed him on Spiriva and Symbicort for his COPD. Repeatedly, he was noncompliant with these secondary to finances. At office visits I gave him samples repeatedly.  He is currently on budesonide Pulmicort nebulizer. Also currently on tiotropium bromide--Stiolto Respimat. Also nasal cannula oxygen. Also albuterol nebulizer.  Also in the past we had [facility] evaluate and visit him but then he was not compliant with following up with them and letting them help him.  We recently did contact them again and I do see that they have entered recent notes starting on 01/25/15 up through 02/09/15.  At visit 02/25/15 he states that he is "all choked up" says that he has phlegm in his chest and throat. In using his nebulizer a lot for 5 times a day and at night. Says that he has oxygen at home but he just uses that one or 2 times per day as needed and sometimes at night.  At visit 02/25/15 he did bring in blood sugar log sheet. He does have documented a fasting reading for every single morning. No other readings at any other times of day. For the month of May all of his readings are ranging 121-200. He only has 3 readings above 200. May 12 was 230. May 14 was 206. May 26-220 For the  month of April fasting readings all range from 109 to 215. However most of these readings are around 130. For the month of March, fasting readings range mostly from 143 to around 170.   At [place] 05/27/2015-- He reports "I guess you saw I was in the hospital"---05/09/15--Preseptal Cellulitis.  -----I asked today if he had f/u with Ophthalmolgist and he says he did not---b/c that's when his wife fell. Says his wife has been in the hospital for 2 weeks. Says she is now in [place] at [employer]. Says "all that running back and forth with her has run me ragged"  I see notes in Epic from"[facility]"---I asked pt if any nurses/staff have come to his house--and he says "no" and acts like he has seen/heard nothing of this.-- I then reviewed further---[facility] Note 03/22/2015---documents that pt does not want further f/u from them--"does not see the need" He also brings in BS readings.  I reviewed my last set of labs---that result note said to start documenting 2 hour post prandial BS readings.  However, readings today are all fasting but are documented for each/every day.  For July: 130s, 160s, also 200, 253, 220, 268 For August: 140-150 mostly, but also 170, 211, 212, 181. One reading of 278---but only one this high.  At [place] 08/30/2015: Reviewed that last A1C 05/27/15 was >14.  He says  that at that time he did increase Levemir from 20 units QHS to 25 units QHS.  Brings BS log: Only has Fasting readings: FOr every morning : Some 131, 121, 134, 111,  Most are 150-160 range. Highest: 200 and 284 but these are the only 2 high readings like this.   Says he has cough at night that keeps him awake--says some nights not so bad, but some nights, cough when lay down. Uses Albuterol multiple times in the night on those nights.  Says he is using all Pulm meds as directed and that they have not run out.   His last Routine OV was 08/30/2015, but after that, he had f/u OVs to f/u glucose and adjust Insulin.  At visit  08/30/15 A1c was 14 that he was told to increase Levemir from 25-30 units in follow-up 2 weeks. He had visit 09/15/15--- that time he had increased and was at Levemir 30 units. At that visit had him increase further to 35 units. He then had follow-up visit 09/20/15--at time he reported that he was up to 35 units. At that visit increased further to 40 units.  12/01/2015: States that his insulin is at 40 units. He brings in blood sugar log sheet with fasting readings daily. These readings include: 278, 230, 197, 269, 218, 287, 241, 259, 245, 221, 193, 140, 197, 191, 190, 233, 323. He brought in no readings from any other time of day. These were all fasting. Also brings me a copy of print out that is by [place] that lists all of his medications and information including price. He tells me that he recently had to stop the [employer] and the albuterol because of cost. However the cost for the albuterol says just $3.85  and the [employer] says $8.10 .  He continues to stay off of his cigarettes. Says he hasn't smoked in over 2 years.  Reports that he is taking blood pressure medication as directed with no headedness. Reports that he is taking his pravastatin with no myalgias or other adverse effects.   At [place] 12/01/15-- Told him to increase Insulin to 45 units and return in 1 week for f/u OV--to bring BS meter and log sheet to that appt  At [place] 12/08/2015: He brings his BS meter.  He brings BS log sheet.  I reviewed the numbers in the meter to make sure that these were accurate and an documenting the readings from the meter: All of these are fasting readings : 3/8-----188 3/7---------- 187 3/6-------------- 305 3/5---------- 205 3/4---------- 269 3/3--------- 227 3/2--------- 277 3/1--------- 332  Did look at his blood sugar log sheet. He has different numbers written on there. Asked if his wife is diabetic or if anyone else is using his meter and he says no.  I really think that  he is illiterate.  For safety purposes, need to go by readings on his meter, not on his BS log sheet.   He promises that he is taking insulin daily and is currently using 45 units-----he tells me this dose on his own-- and this is correct-- this is the amount that I told him at last visit  At this time, will have him increase to 55 units and f/u in 1 week with fasting BS readings---ON HIS METER---NOT WRITTEN ON LOG SHEET-------------  AT [facility] 12/15/2015: He reports that he is now doing insulin 55 units. He did bring his blood sugar meter with him and this is what I have reviewed.  Early morning readings: 12/08/15------------------ 188 12/09/15------------------ 207 12/10/15---------------- 158 12/11/15----------------- 119 12/12/15------------------ 114 12/13/15------------------ 205 12/14/2015------------- 185 12/15/15------------------ 267.  All of these fasting readings are performed around 3 AM. He states that he always wakes up around 3 AM. Says that he then eats oatmeal or sometimes a boiled egg. He does not eat any lunch. Through the day he just has water or milk. Yesterday around 4 PM he ate pinto beans. Says that later after that he had some milk and a few Nilla Wafers. Says that this is pretty average/typical of his usual daily intake. I asked if part of the reason that he eats a little is because of finances and because this is all he has to eat but he says that that is not it and says that he just isn't hungry for anything more. Asked if he snacks throughout the day and he says that he does not. Asked if he usually eats something like Nilla wafers in the evening--and he says not all the time.   03/23/2016: Says was able to get med for constipation (Amitiza)--says it is working well ---(Had OV with me regarding this 03/16/2016) Says he has "heartburn"---asks if I cna give him some medicine to use for heartburn. Brings BS log and has meter also Is not filling in log correctly again at  this time--CAN NOT GO BYY LOG--MUST LOOK AT METER Readings from meter: All of these are taken at ~ 2: 00 a.m. 6/22--- 190 6/19---  302 6/18--  285 6/17--  227 6/16--  347 6/14--  397 6/10---  263 6/8--  316  Iasked how much Insulin he is giving--says " 55"  Which is correct. Says he gives this ~ 6 pm then watches TV then goes to bed ~ 8 pm. At that visit I increased his insulin to 65 units daily at bedtime and told him to return in about one week. Bring blood sugar meter with him to that appointment.  03/30/2016:  Asked how much insulin he is giving and he says 65 units which is correct. Also he brings in his meter and also his sheet where he has been writing down blood sugars. The blood sugars that he has written down in the column that he says are the knee readings since last visit do correspond to the readings over the past week on his meter. 6/29--------145 6/28------182 6/27------239 6/26----------178 6/25-------109 6/24---------190 6/23---------171  07/06/2016: He reports that he has used no insulin for about one month now secondary to cost.----What he is reporting is consistent with "doughnut hole " States that he has been using his breathing medications so far but soon will not be able to afford the refills on these either secondary to hitting the doughnut hole. During his visit today when just speaking with him I can tell that he sounds like he has some wheezing and increased congestion. He said this has been a little worse just the past few days. No other complaints or concerns today. He does bring in some blood sugar log readings but they have been a little higher recently as he has been out of his insulin. I am not even documenting them since he is not even using his insulin. Says that his blood sugar monitor seems like some strip has gotten stuck down in the near and he cannot use his monitor. We have given him a new sample box today. This includes a new meter and  supplies.    10/11/2016: Today he reports that he had a  sore throat that just started yesterday. Says that it was worse last night but is better now. Later adds that he isn't sure if his throat is just sore from coughing.  Says that he has had no money for insulin so he has not been on any insulin-- so his blood sugars are running really high.  Says "you get me those medicines for breathing and my breathing gets better but then I go off the medicine and my breathing gets bad again--- can I just stay on those medicines ?"----asked him what medicines he is using for breathing medicines right now. He pulls out a [employer] and says that that is the only type of inhaler or nebulizer/only medicine for COPD that he is using right now--has not been able to afford other pulmonary meds.  No other specific complaints or concerns today.  Past Medical History:  Diagnosis Date  . Allergy    Rhinitis  . Bronchitis   . Chronic respiratory failure ([place])   . Colon polyps   . COPD (chronic obstructive pulmonary disease) ([place])   . Diabetes mellitus   . Elevated lipids   . Hypercholesterolemia   . Hypertension   . On home O2    2L N/C   . PSA elevation   . Pulmonary fibrosis ([place])   . Vitamin D deficiency      Home Meds: Outpatient Medications Prior to Visit  Medication Sig Dispense Refill  . albuterol (PROVENTIL) (2.5 MG/3ML) 0.083% nebulizer solution INHALE 1 VIAL VIA NEBULIZER EVERY 6 HOURS AS NEEDED FOR WHEEZING OR SHORTNESS OF BREATH 360 mL 5  . aspirin 81 MG EC tablet TAKE 1 TABLET BY MOUTH ONCE DAILY 30 tablet 3  . budesonide (PULMICORT) 0.5 MG/2ML nebulizer solution Take 2 mLs (0.5 mg total) by nebulization 2 (two) times daily. 120 mL 5  . ipratropium (ATROVENT) 0.02 % nebulizer solution Take 2.5 mLs (0.5 mg total) by nebulization 4 (four) times daily. 25 mL 12  . losartan (COZAAR) 100 MG tablet Take 1 tablet (100 mg total) by mouth daily. 30 tablet 5  . metFORMIN (GLUCOPHAGE) 1000 MG  tablet Take 1 tablet (1,000 mg total) by mouth 2 (two) times daily. 60 tablet 5  . omeprazole (PRILOSEC) 20 MG capsule Take 1 capsule (20 mg total) by mouth daily. 30 capsule 3  . pravastatin (PRAVACHOL) 80 MG tablet Take 1 tablet (80 mg total) by mouth at bedtime. 30 tablet 5  . PROAIR RESPICLICK 123XX123 (90 Base) MCG/ACT AEPB Inhale 108 mcg into the lungs 3 (three) times daily.    . Tiotropium Bromide-Olodaterol (STIOLTO RESPIMAT) 2.5-2.5 MCG/ACT AERS Inhale 2 Inhalers into the lungs daily. 1 Inhaler 5  . Insulin Detemir (LEVEMIR FLEXTOUCH) 100 UNIT/ML Pen 70 units at bedtime (Patient not taking: Reported on 10/11/2016) 15 mL 11   No facility-administered medications prior to visit.      Allergies:  Allergies  Allergen Reactions  . Ace Inhibitors Other (See Comments)    Hyperkalemia--07/23/2013:patient states not familiar with the following allergy    Social History   Social History  . Marital status: Married    Spouse name: N/A  . Number of children: N/A  . Years of education: N/A   Occupational History  . 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 Smoking status: Former Smoker    Packs/day: 1.50    Years: 60.00    Types: Cigarettes    Quit date: 12/31/2012  .  Smokeless tobacco: Never Used  . Alcohol use No  . Drug use: No  . Sexual activity: Yes    Birth control/ protection: None   Other Topics Concern  . Not on file   Social History Narrative  . No narrative on file    Family History  Problem Relation Age of Onset  . Heart disease Mother   . CAD Other   . Diabetes Other      Review of Systems:  See HPI for pertinent ROS. All other ROS negative.    Physical Exam: Blood pressure (!) 148/70, pulse 89, temperature 98.1 F (36.7 C), temperature source Oral, resp. rate 16, weight 157 lb (71.2 kg), SpO2 95 %., Body mass index is 25.34 kg/m.  General: WNWD WM, "Weathered Appearance" Appears in no acute distress. HEENT: Throat: Moderate erythema.  No thrush seen. No exudate. No abscess. Neck: Supple. No thyromegaly. No lymphadenopathy. No carotid bruits. Lungs: Moderate wheezes throughout bilaterally. Heart: RRR with S1 S2. No murmurs, rubs, or gallops. Abdomen: Soft, non-tender, non-distended with normoactive bowel sounds. No hepatomegaly. No rebound/guarding. No obvious abdominal masses. Musculoskeletal:  Strength and tone normal for age. Extremities/Skin: Warm and dry. No edema.  Neuro: Alert and oriented X 3. Moves all extremities spontaneously. Gait is normal. CNII-XII grossly in tact. Psych:  Responds to questions appropriately with a normal affect. Diabetic foot exam: Inspection is normal. No wounds or concerning lesions. Sensation is intact. On the right: 2+ dorsalis pedis 2+ PT. On the left 1+ DP. 2+ PT.      ASSESSMENT AND PLAN:  68 y.o. year old male with    COPD exacerbation ([place]) At [place] 10/11/2016 I am adding the following prednisone for current COPD exacerbation.  - predniSONE (DELTASONE) 20 MG tablet; Take 2 daily for 5 days then go down to taking 1 daily for 2 days.   History of repeated noncompliance in the past secondary to finances.  In the past he frequently could not afford all of his oral diabetic medications. In the past has also reported that he is unable to afford his Spiriva and Symbicort. We have been providing him with samples of both Spiriva and Symbicort. As well, we have been providing him with samples of Lantus or Levemir as often as possible. At his visit with me 03/12/14 I had [name] to contact [employer] to see whether they can help provide some assistance to him. [facility] follow-up with him but he did not want their assistance --01/2015--[facility] has reached out to pt again.  Is now on Neb meds At [place] 05/27/2015----I asked pt about "[facility]" and he says "no"--nobody coming to his house, nobody assisting him with his treatment-- I then reviewed further---[facility] Note 03/22/2015---documents that pt  does not want further f/u from them--"does not see the need"  At [facility] 12/01/2015--Reports not currently using Pulmonary meds sec to cost---At that OV I gave him 2 samples of Advair to use for now.   AT [place] 07/06/2016---Gave him samples of Long-acting Insulin Pen---Gave him 3 or 4 At [facility] 07/06/2016---Gave him 3 samples of Steolto  history of smoking. He quit smoking January 2014. Congratulations and staying off of the cigarettes!!  COPD At [place] 10/11/2016 I have given him sample of Symbicort 2 boxes of this. Told him to start using this twice a day. Also gave him prescription for prednisone-- see above for COPD exacerbation At [facility] 08/30/15--will add low dose prednisone---do not want BS to increase furhter but do not want him to  develop COPD exacerbation Status post ER visit for COPD on 11/04/13 Another ER visit for COPD on 11/06/13 Status post hospitalization for COPD exacerbation 11/15/13 See HPI regarding Spiriva and Symbicort.  He is on now on Pulmicort nebulizer (budesonide) He is now on Stiolto Respimat (tiotropium bromide) He has oxygen nasal cannula at home but is not using it regularly.  Diabetes  At [facility] 03/30/2016---Instructed him to INCREASE INSULIN TO 70 UNITS QHS.--THIS IS LAST DOSE I INSTRUCTED HIM TO DO OV 10/11/16 I gave him 3 boxes of Basaglar. Told him to use this same amount of this as he did with his Levemir/other insulin in the past. Continues these 3 samples to help hold him over.  - Hemoglobin A1c  Microalbumin 05/27/2015, 07/06/2016 I have discussed need for routine eye exams and dental exams but he hasn't been able to followup with the secondary to finances. On ARB. (H/O hyperkalemia with ACE Inh) On statin. On ASA 81mg .   Hypertension At [facility] 07/23/13 his potassium was even higher. It had been borderline high prior to this. At that time his ACE inhibitor was stopped and he was started on ARB. BP hgih at [place] 05/27/2015----will monitor prior to adjusting meds---pt says "it is b/c he  has been running ragged b/c of his wife being in the hospital" BP good at [facility] 08/30/15  Elevated lipids He is on Pravastatin 80mg . FLP excellent 02/25/2015 with LDL 68. LFTs were normal.  Pulmonary fibrosis Managed by Pulmonary  Elevated PSA He had elevated PSA at [facility] 07/2012. Also prior to this PSA was elevated. He has been informed this could be secondary to prostate cancer but refused followup with urology. He still refuses to recheck PSA. He is aware that he could have prostate cancer but does not want further evaluation or followup with his AT ALL.  Colon polyps History of Hemoccult-positive stool x3 in 07/2008. Refused GI evaluation despite being informed of risk of severe GI bleed, cancer, et Ronney Asters. H./H. normal 03/2010 and 01/2011. Refuses colonoscopy.  Immunizations: We have discussed influenza vaccine last flu season and he is deferred. Pneumonia vaccine: He received Pneumovax June 2003.  Received  Prevnar 13  10/23/2013 .  Tetanus: Gave in September 2005. Not covered by Medicare --therefore has deferred f/u sec to cost.  Zostavax: He has had so much problem with compliance, I have not even discussed this with pt!!     Signed, [address] [place], [place], BSFM 10/11/2016 8:16 AM

## 2016-10-12 ENCOUNTER — Telehealth: Payer: Self-pay

## 2016-10-12 LAB — HEMOGLOBIN A1C: Hgb A1c MFr Bld: >14 % — ABNORMAL HIGH (ref <5.7)

## 2016-10-12 NOTE — Telephone Encounter (Signed)
Edward [name] from [facility] called to report a critical lab for [name] [name] notified

## 2016-10-18 DIAGNOSIS — J449 Chronic obstructive pulmonary disease, unspecified: Secondary | ICD-10-CM | POA: Diagnosis not present

## 2016-11-03 ENCOUNTER — Other Ambulatory Visit: Payer: Self-pay | Admitting: Physician Assistant

## 2016-11-06 NOTE — Telephone Encounter (Signed)
Refill appropriate 

## 2016-11-07 ENCOUNTER — Encounter: Payer: Self-pay | Admitting: Family Medicine

## 2016-11-07 ENCOUNTER — Ambulatory Visit (INDEPENDENT_AMBULATORY_CARE_PROVIDER_SITE_OTHER): Payer: Medicare Other | Admitting: Family Medicine

## 2016-11-07 VITALS — BP 140/60 | HR 102 | Temp 98.4°F | Resp 18 | Wt 158.4 lb

## 2016-11-07 DIAGNOSIS — L089 Local infection of the skin and subcutaneous tissue, unspecified: Secondary | ICD-10-CM

## 2016-11-07 DIAGNOSIS — J441 Chronic obstructive pulmonary disease with (acute) exacerbation: Secondary | ICD-10-CM | POA: Diagnosis not present

## 2016-11-07 DIAGNOSIS — E1165 Type 2 diabetes mellitus with hyperglycemia: Secondary | ICD-10-CM | POA: Diagnosis not present

## 2016-11-07 DIAGNOSIS — S00521A Blister (nonthermal) of lip, initial encounter: Secondary | ICD-10-CM | POA: Diagnosis not present

## 2016-11-07 DIAGNOSIS — IMO0001 Reserved for inherently not codable concepts without codable children: Secondary | ICD-10-CM

## 2016-11-07 DIAGNOSIS — Z794 Long term (current) use of insulin: Secondary | ICD-10-CM

## 2016-11-07 MED ORDER — CEPHALEXIN 500 MG PO CAPS: 500.0000 mg | ORAL_CAPSULE | Freq: Two times a day (BID) | ORAL | 0 refills | Status: DC

## 2016-11-07 MED ORDER — CEFTRIAXONE SODIUM 500 MG IJ SOLR
500.0000 mg | Freq: Once | INTRAMUSCULAR | Status: AC
  Administered 2016-11-07: 500 mg via INTRAMUSCULAR

## 2016-11-07 MED ORDER — METHYLPREDNISOLONE ACETATE 40 MG/ML IJ SUSP
40.0000 mg | Freq: Once | INTRAMUSCULAR | Status: AC
  Administered 2016-11-07: 40 mg via INTRAMUSCULAR

## 2016-11-07 MED ORDER — PREDNISONE 20 MG PO TABS: ORAL_TABLET | ORAL | 0 refills | Status: DC

## 2016-11-07 NOTE — Patient Instructions (Signed)
Take 50units of the Tujeo Use the symbicort twice a day  Get the steroids and antibiotics by tomorrow Given shot of steroid and antibiotic today  Call if lip does not improve

## 2016-11-07 NOTE — Progress Notes (Signed)
Subjective:    Patient ID: Edward [name], male    DOB: 03/13/1935, 82 y.o.   MRN: [id number]  Patient presents for Cough (has had for a long time); Wheezing; and sore on lip Issue here with cough with congestion and wheezing. He was seen about 3 weeks ago unfortunately compliance medications difficult because of finances. He does not take his COPD medications regularly therefore comes in with exacerbation or go to the hospital. He also does not consistently keep his insulin. He was given samples of both insulin and Symbicort at the last visit he states that he is out of them both. Discussed the proper way to take the medications. He does have some of his nebulizer medication and he also wears oxygen typically 3 L throughout the day and at bedtime. His cough has mild production of thick sputum. He is also wheezing.  He is also concerned about swelling on his left lower lip states that it started a few days ago. He has not changed anything different he does not have any new medications he's been on losartan for quite some time. He denies any tingling in his mouth or sore throat.  He is not checking his blood sugar has not had any insulin. The chart A1c is consistently above 13    Review Of Systems:  GEN- denies fatigue, fever, weight loss,weakness, recent illness HEENT- denies eye drainage, change in vision, nasal discharge, CVS- denies chest pain, palpitations RESP- denies SOB, cough, wheeze ABD- denies N/V, change in stools, abd pain GU- denies dysuria, hematuria, dribbling, incontinence MSK- denies joint pain, muscle aches, injury Neuro- denies headache, dizziness, syncope, seizure activity       Objective:    BP 140/60   Pulse (!) 102   Temp 98.4 F (36.9 C) (Oral)   Resp 18   Wt 158 lb 6.4 oz (71.8 kg)   SpO2 96%   BMI 25.57 kg/m  GEN- NAD, alert and oriented x3 HEENT- PERRL, EOMI, non injected sclera, pink conjunctiva, MMM, oropharynx clear, left lower lip swelling has  pustule, expressed discharge from pustules, scab on philtrum, no erythema  Neck- Supple, no LAD  CVS- RRR, no murmur RESP- scattered wheeze, congestion, normal WOB at rest, no retractions  EXT- No edema Pulses- Radial, 2+        Assessment & Plan:      Problem List Items Addressed This Visit    Diabetes mellitus type 2, uncontrolled, without complications ([place])    Uncontrolled diabetes mellitus I have given him 2 boxes of Tujeo is the only insulin we have on hand at this time. My concern is that he is not consistently taking it at all and 70 units likely uses up his insulin fairly quickly when we do give him samples. To help with compliance at least some steady insulin on that and have him take 50 units of this daily.  We will discuss any other resources that we can fine for him It seems we have called [facility] and he has declined application for assistance. We'll see if he can work with one of the [employer] which allows smaller co-pays      COPD exacerbation ([place])    Recurrent COPD exacerbations due to not having his maintenance medication. I've given him a shot of Depo-Medrol today he does not think he can get any medicines for the next few days. Have also given him 4 boxes of Symbicort samples as well. With regard to the lesion  on his lip my concerns with his blood sugars high seen a pustule on the lipids as his infection not quite angioedema that that is a possibility since he is on losartan. This time and treat him with antibiotics. Once again because she cannot get them today I've given 500 mg of Rocephin in the office. He will also keep the area clean. He also has a scab on his philtrum which does not look superinfected at this time.      Relevant Medications   predniSONE (DELTASONE) 20 MG tablet   methylPREDNISolone acetate (DEPO-MEDROL) injection 40 mg (Completed)    Other Visit Diagnoses    Blister of lip with infection, initial encounter     -  Primary   Relevant Medications   cephALEXin (KEFLEX) 500 MG capsule   methylPREDNISolone acetate (DEPO-MEDROL) injection 40 mg (Completed)   cefTRIAXone (ROCEPHIN) injection 500 mg (Completed)   Other Relevant Orders   WOUND CULTURE      Note: This dictation was prepared with Dragon dictation along with smaller phrase technology. Any transcriptional errors that result from this process are unintentional.

## 2016-11-07 NOTE — Assessment & Plan Note (Signed)
Uncontrolled diabetes mellitus I have given him 2 boxes of Tujeo is the only insulin we have on hand at this time. My concern is that he is not consistently taking it at all and 70 units likely uses up his insulin fairly quickly when we do give him samples. To help with compliance at least some steady insulin on that and have him take 50 units of this daily.  We will discuss any other resources that we can fine for him

## 2016-11-07 NOTE — Assessment & Plan Note (Signed)
Recurrent COPD exacerbations due to not having his maintenance medication. I've given him a shot of Depo-Medrol today he does not think he can get any medicines for the next few days. Have also given him 4 boxes of Symbicort samples as well. With regard to the lesion on his lip my concerns with his blood sugars high seen a pustule on the lipids as his infection not quite angioedema that that is a possibility since he is on losartan. This time and treat him with antibiotics. Once again because she cannot get them today I've given 500 mg of Rocephin in the office. He will also keep the area clean. He also has a scab on his philtrum which does not look superinfected at this time.

## 2016-11-11 LAB — WOUND CULTURE
Gram Stain: NONE SEEN
Gram Stain: NONE SEEN
Gram Stain: NONE SEEN

## 2016-11-13 ENCOUNTER — Other Ambulatory Visit: Payer: Self-pay | Admitting: *Deleted

## 2016-11-13 MED ORDER — SULFAMETHOXAZOLE-TRIMETHOPRIM 800-160 MG PO TABS: 1.0000 | ORAL_TABLET | Freq: Two times a day (BID) | ORAL | 0 refills | Status: DC

## 2016-11-13 MED ORDER — MUPIROCIN 2 % EX OINT: 1.0000 "application " | TOPICAL_OINTMENT | Freq: Two times a day (BID) | CUTANEOUS | 0 refills | Status: DC

## 2016-11-18 DIAGNOSIS — J449 Chronic obstructive pulmonary disease, unspecified: Secondary | ICD-10-CM | POA: Diagnosis not present

## 2016-12-14 ENCOUNTER — Telehealth: Payer: Self-pay

## 2016-12-14 ENCOUNTER — Other Ambulatory Visit: Payer: Self-pay | Admitting: *Deleted

## 2016-12-14 NOTE — Telephone Encounter (Signed)
Patient brought in a [employer] form to see if Karis Juba would excuse him from jury summons.  Olean Ree declined filling out the form indicating there was no valid reason for the patient not to go to jury duty   Called spoke with patient he is aware

## 2016-12-15 NOTE — Patient Outreach (Signed)
[facility]) Care Management  12/15/2016  Edward [name] December 03, 1934 [id number]  Case Closure note:   [facility] worked with Edward [name] closely through December 2017. He was having difficulty with DM and medication management. I spent a great deal of time working with Edward [name] on modification of his diet and lifestyle and requested the assistance of our pharmacist. Isabell Jarvis PharmD worked closely with Edward [name] and offered to help him with LIS application and manufacturer assistance programs to help him with medication costs. Edward [name] did not want to apply for LIS via social security evaluation/application stating he "don't want to give the government information about my money." He said he understood that declining to complete the LIS application excluded him from patient assistance for medications through drug manufacturers. We advised Edward [name] to get in touch with [place] should he decide he wanted to assistance with LIS or manufacturer assistance programs or if we could provide other assistance in the future. Edward [name] thanked [place] for our help and took my business card with contact information.   Plan: Should Edward [name] be interested in assistance for disease management or medication assistance in the future, we will gladly reach out to him.    [facility]  [phone]

## 2016-12-16 DIAGNOSIS — J449 Chronic obstructive pulmonary disease, unspecified: Secondary | ICD-10-CM | POA: Diagnosis not present

## 2016-12-29 ENCOUNTER — Other Ambulatory Visit: Payer: Self-pay | Admitting: Physician Assistant

## 2017-01-01 NOTE — Telephone Encounter (Signed)
Refill appropriate 

## 2017-01-08 ENCOUNTER — Telehealth: Payer: Self-pay

## 2017-01-08 NOTE — Telephone Encounter (Signed)
Lynita Lombard NP with [employer] called in regards to Mr. Strutz. Katharine Look states Mr. Jaggi has not been talking his insulin due to cost and that his Glucose was 571, A1c over 13 O2 94% protein +1, Glucose +4. Informed [facility] wile Katharine Look was on the phone. Call was then transferred to [facility]. Patient came into the office today and picked up 2 samples of Toujeo. Patient also has an appointment on 4-11

## 2017-01-10 ENCOUNTER — Encounter: Payer: Self-pay | Admitting: Physician Assistant

## 2017-01-10 ENCOUNTER — Ambulatory Visit (INDEPENDENT_AMBULATORY_CARE_PROVIDER_SITE_OTHER): Payer: Medicare Other | Admitting: Physician Assistant

## 2017-01-10 VITALS — BP 154/86 | HR 88 | Resp 18 | Wt 163.2 lb

## 2017-01-10 DIAGNOSIS — Z794 Long term (current) use of insulin: Secondary | ICD-10-CM | POA: Diagnosis not present

## 2017-01-10 DIAGNOSIS — Z9114 Patient's other noncompliance with medication regimen: Secondary | ICD-10-CM

## 2017-01-10 DIAGNOSIS — J8410 Pulmonary fibrosis, unspecified: Secondary | ICD-10-CM

## 2017-01-10 DIAGNOSIS — E785 Hyperlipidemia, unspecified: Secondary | ICD-10-CM

## 2017-01-10 DIAGNOSIS — J439 Emphysema, unspecified: Secondary | ICD-10-CM

## 2017-01-10 DIAGNOSIS — E1165 Type 2 diabetes mellitus with hyperglycemia: Secondary | ICD-10-CM | POA: Diagnosis not present

## 2017-01-10 DIAGNOSIS — I10 Essential (primary) hypertension: Secondary | ICD-10-CM

## 2017-01-10 DIAGNOSIS — R972 Elevated prostate specific antigen [PSA]: Secondary | ICD-10-CM

## 2017-01-10 DIAGNOSIS — IMO0001 Reserved for inherently not codable concepts without codable children: Secondary | ICD-10-CM

## 2017-01-10 NOTE — Progress Notes (Signed)
Patient ID: Edward [name] MRN: [id number], DOB: 1935-08-26, 82 y.o. Date of Encounter: @DATE @  Chief Complaint:  Chief Complaint  Patient presents with  . Diabetes    routine check up, is fasting    HPI: 82 y.o. year old male  presents for routine followup office visit.    At office visit with me 10/30/13 at which time we stopped multiple oral medicines and started Lantus. On 11/04/13 he had an ER visit with COPD. On 11/06/13 he had another ER visit with COPD. 11/10/2013 he had office visit with me to followup with COPD. 11/15/13 he was hospitalized with COPD. 12/01/13 have followup office visit with me regarding his COPD. 12/08/13 he had another followup office visit with me.  In the past I had placed him on Spiriva and Symbicort for his COPD. Repeatedly, he was noncompliant with these secondary to finances. At office visits I gave him samples repeatedly.  He is currently on budesonide Pulmicort nebulizer. Also currently on tiotropium bromide--Stiolto Respimat. Also nasal cannula oxygen. Also albuterol nebulizer.  Also in the past we had [facility] evaluate and visit him but then he was not compliant with following up with them and letting them help him.  We recently did contact them again and I do see that they have entered recent notes starting on 01/25/15 up through 02/09/15.  At visit 80/26/16 he states that he is "all choked up" says that he has phlegm in his chest and throat. In using his nebulizer a lot for 5 times a day and at night. Says that he has oxygen at home but he just uses that one or 2 times per day as needed and sometimes at night.  At visit 80/26/16 he did bring in blood sugar log sheet. He does have documented a fasting reading for every single morning. No other readings at any other times of day. For the month of May all of his readings are ranging 121-200. He only has 3 readings above 200. May 12 was 230. May 14 was 206. May 26-220 For the month of April fasting readings  all range from 109 to 215. However most of these readings are around 130. For the month of March, fasting readings range mostly from 143 to around 170.   At [place] 05/27/2015-- He reports "I guess you saw I was in the hospital"---05/09/15--Preseptal Cellulitis.  -----I asked today if he had f/u with Ophthalmolgist and he says he did not---b/c that's when his wife fell. Says his wife has been in the hospital for 2 weeks. Says she is now in [place] at [employer]. Says "all that running back and forth with her has run me ragged"  I see notes in Epic from"[facility]"---I asked pt if any nurses/staff have come to his house--and he says "no" and acts like he has seen/heard nothing of this.-- I then reviewed further---[facility] Note 03/22/2015---documents that pt does not want further f/u from them--"does not see the need" He also brings in BS readings.  I reviewed my last set of labs---that result note said to start documenting 2 hour post prandial BS readings.  However, readings today are all fasting but are documented for each/every day.  For July: 130s, 160s, also 200, 253, 220, 268 For August: 140-150 mostly, but also 170, 211, 212, 181. One reading of 278---but only one this high.  At [place] 08/30/2015: Reviewed that last A1C 05/27/15 was >14.  He says that at that time he did increase Levemir from 20  units QHS to 25 units QHS.  Brings BS log: Only has Fasting readings: FOr every morning : Some 131, 121, 134, 111,  Most are 150-160 range. Highest: 200 and 284 but these are the only 2 high readings like this.   Says he has cough at night that keeps him awake--says some nights not so bad, but some nights, cough when lay down. Uses Albuterol multiple times in the night on those nights.  Says he is using all Pulm meds as directed and that they have not run out.   His last Routine OV was 08/30/2015, but after that, he had f/u OVs to f/u glucose and adjust Insulin.  At visit 08/30/15 A1c was 14 that he was  told to increase Levemir from 25-30 units in follow-up 2 weeks. He had visit 09/15/15--- that time he had increased and was at Levemir 30 units. At that visit had him increase further to 35 units. He then had follow-up visit 09/20/15--at time he reported that he was up to 35 units. At that visit increased further to 40 units.  12/01/2015: States that his insulin is at 40 units. He brings in blood sugar log sheet with fasting readings daily. These readings include: 278, 230, 197, 269, 218, 287, 241, 259, 245, 221, 193, 140, 197, 191, 190, 233, 323. He brought in no readings from any other time of day. These were all fasting. Also brings me a copy of print out that is by [employer] that lists all of his medications and information including price. He tells me that he recently had to stop the [employer] and the albuterol because of cost. However the cost for the albuterol says just $3.85  and the [employer] says $8.10 .  He continues to stay off of his cigarettes. Says he hasn't smoked in over 2 years.  Reports that he is taking blood pressure medication as directed with no headedness. Reports that he is taking his pravastatin with no myalgias or other adverse effects.   At [place] 12/01/15-- Told him to increase Insulin to 45 units and return in 1 week for f/u OV--to bring BS meter and log sheet to that appt  At [place] 12/08/2015: He brings his BS meter.  He brings BS log sheet.  I reviewed the numbers in the meter to make sure that these were accurate and an documenting the readings from the meter: All of these are fasting readings : 3/8-----188 3/7---------- 187 3/6-------------- 305 3/5---------- 205 3/4---------- 269 3/3--------- 227 3/2--------- 277 3/1--------- 332  Did look at his blood sugar log sheet. He has different numbers written on there. Asked if his wife is diabetic or if anyone else is using his meter and he says no.  I really think that he is illiterate.  For safety  purposes, need to go by readings on his meter, not on his BS log sheet.   He promises that he is taking insulin daily and is currently using 45 units-----he tells me this dose on his own-- and this is correct-- this is the amount that I told him at last visit  At this time, will have him increase to 55 units and f/u in 1 week with fasting BS readings---ON HIS METER---NOT WRITTEN ON LOG SHEET-------------  AT [facility] 12/15/2015: He reports that he is now doing insulin 55 units. He did bring his blood sugar meter with him and this is what I have reviewed. Early morning readings: 12/08/15------------------ 188 12/09/15------------------ 207 12/10/15---------------- 158 12/11/15-----------------  119 12/12/15------------------ 114 12/13/15------------------ 205 12/14/2015------------- 185 12/15/15------------------ 267.  All of these fasting readings are performed around 3 AM. He states that he always wakes up around 3 AM. Says that he then eats oatmeal or sometimes a boiled egg. He does not eat any lunch. Through the day he just has water or milk. Yesterday around 4 PM he ate pinto beans. Says that later after that he had some milk and a few Nilla Wafers. Says that this is pretty average/typical of his usual daily intake. I asked if part of the reason that he eats a little is because of finances and because this is all he has to eat but he says that that is not it and says that he just isn't hungry for anything more. Asked if he snacks throughout the day and he says that he does not. Asked if he usually eats something like Nilla wafers in the evening--and he says not all the time.   03/23/2016: Says was able to get med for constipation (Amitiza)--says it is working well ---(Had OV with me regarding this 03/16/2016) Says he has "heartburn"---asks if I cna give him some medicine to use for heartburn. Brings BS log and has meter also Is not filling in log correctly again at this time--CAN NOT GO BYY  LOG--MUST LOOK AT METER Readings from meter: All of these are taken at ~ 2: 00 a.m. 6/22--- 190 6/19---  302 6/18--  285 6/17--  227 6/16--  347 6/14--  397 6/10---  263 6/8--  316  Iasked how much Insulin he is giving--says " 55"  Which is correct. Says he gives this ~ 6 pm then watches TV then goes to bed ~ 8 pm. At that visit I increased his insulin to 65 units daily at bedtime and told him to return in about one week. Bring blood sugar meter with him to that appointment.  03/30/2016:  Asked how much insulin he is giving and he says 65 units which is correct. Also he brings in his meter and also his sheet where he has been writing down blood sugars. The blood sugars that he has written down in the column that he says are the knee readings since last visit do correspond to the readings over the past week on his meter. 6/29--------145 6/28------182 6/27------239 6/26----------178 6/25-------109 6/24---------190 6/23---------171  07/06/2016: He reports that he has used no insulin for about one month now secondary to cost.----What he is reporting is consistent with "doughnut hole " States that he has been using his breathing medications so far but soon will not be able to afford the refills on these either secondary to hitting the doughnut hole. During his visit today when just speaking with him I can tell that he sounds like he has some wheezing and increased congestion. He said this has been a little worse just the past few days. No other complaints or concerns today. He does bring in some blood sugar log readings but they have been a little higher recently as he has been out of his insulin. I am not even documenting them since he is not even using his insulin. Says that his blood sugar monitor seems like some strip has gotten stuck down in the near and he cannot use his monitor. We have given him a new sample box today. This includes a new meter and supplies.    10/11/2016: Today  he reports that he had a sore throat that just started yesterday. Says that it was  worse last night but is better now. Later adds that he isn't sure if his throat is just sore from coughing.  Says that he has had no money for insulin so he has not been on any insulin-- so his blood sugars are running really high.  Says "you get me those medicines for breathing and my breathing gets better but then I go off the medicine and my breathing gets bad again--- can I just stay on those medicines ?"----asked him what medicines he is using for breathing medicines right now. He pulls out a [employer] and says that that is the only type of inhaler or nebulizer/only medicine for COPD that he is using right now--has not been able to afford other pulmonary meds.  No other specific complaints or concerns today.   01/10/2017: A nurse was at his house doing a home visit on Monday 01/08/17. Today patient brings in form that she completed during that visit. Also,at the time of that home visit -- while she was at his home -- she called here and actually spoke with her directly. She called to report that hemoglobin A1c was greater than 13 and blood sugar was 571. Had patient come and get sample of long-acting insulin so that he could take this. When that nurse called she informed me that patient had been taking no insulin secondary to cost. He was unable to afford insulin so had been taking none. Today he informs me that he has been able to buy no insulin at all. The only insulin he uses is samples that he is given from this office. It is the same with the COPD meds. He uses no type of breathing medicine except for the samples I provide him. Otherwise he has no other specific complaints or concerns today. He does take the other medications that are not so expensive. Takinglood pressure medicines as directed. Having no lightheadedness. Taking cholesterol medicine as directed. No myalgias or other adverse effects. He is taking the  metformin as this is not expensive but is unable to afford the insulin.  Past Medical History:  Diagnosis Date  . Allergy    Rhinitis  . Bronchitis   . Chronic respiratory failure ([place])   . Colon polyps   . COPD (chronic obstructive pulmonary disease) ([place])   . Diabetes mellitus   . Elevated lipids   . Hypercholesterolemia   . Hypertension   . On home O2    2L N/C   . PSA elevation   . Pulmonary fibrosis ([place])   . Vitamin D deficiency      Home Meds: Outpatient Medications Prior to Visit  Medication Sig Dispense Refill  . albuterol (PROVENTIL) (2.5 MG/3ML) 0.083% nebulizer solution INHALE 1 VIAL VIA NEBULIZER EVERY 6 HOURS AS NEEDED FOR WHEEZING OR SHORTNESS OF BREATH 360 mL 5  . aspirin 81 MG EC tablet TAKE 1 TABLET BY MOUTH ONCE DAILY 30 tablet 3  . budesonide (PULMICORT) 0.5 MG/2ML nebulizer solution Take 2 mLs (0.5 mg total) by nebulization 2 (two) times daily. 120 mL 5  . ipratropium (ATROVENT) 0.02 % nebulizer solution Take 2.5 mLs (0.5 mg total) by nebulization 4 (four) times daily. 25 mL 12  . losartan (COZAAR) 100 MG tablet Take 1 tablet (100 mg total) by mouth daily. 30 tablet 5  . metFORMIN (GLUCOPHAGE) 1000 MG tablet Take 1 tablet (1,000 mg total) by mouth 2 (two) times daily. 60 tablet 5  . omeprazole (PRILOSEC) 20 MG capsule Take 1 capsule (20  mg total) by mouth daily. 30 capsule 3  . ONE TOUCH ULTRA TEST test strip CHECK FASTING BLOOD SUGAR TWICE DAILY 100 each 4  . pravastatin (PRAVACHOL) 80 MG tablet Take 1 tablet (80 mg total) by mouth at bedtime. 30 tablet 5  . Insulin Detemir (LEVEMIR FLEXTOUCH) 100 UNIT/ML Pen 70 units at bedtime (Patient not taking: Reported on 01/10/2017) 15 mL 11  . PROAIR RESPICLICK 193 (90 Base) MCG/ACT AEPB Inhale 108 mcg into the lungs 3 (three) times daily.    . Tiotropium Bromide-Olodaterol (STIOLTO RESPIMAT) 2.5-2.5 MCG/ACT AERS Inhale 2 Inhalers into the lungs daily. (Patient not taking: Reported on 01/10/2017) 1 Inhaler 5  .  mupirocin ointment (BACTROBAN) 2 % Place 1 application into the nose 2 (two) times daily. x5 days 22 g 0  . predniSONE (DELTASONE) 20 MG tablet Take 2 tablets daily for 5 days 10 tablet 0  . sulfamethoxazole-trimethoprim (BACTRIM DS,SEPTRA DS) 800-160 MG tablet Take 1 tablet by mouth 2 (two) times daily. 14 tablet 0   No facility-administered medications prior to visit.      Allergies:  Allergies  Allergen Reactions  . Ace Inhibitors Other (See Comments)    Hyperkalemia--07/23/2013:patient states not familiar with the following allergy    Social History   Social History  . Marital status: Married    Spouse name: N/A  . Number of children: N/A  . Years of education: N/A   Occupational History  . 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 Smoking status: Former Smoker    Packs/day: 1.50    Years: 60.00    Types: Cigarettes    Quit date: 12/31/2012  . Smokeless tobacco: Never Used  . Alcohol use No  . Drug use: No  . Sexual activity: Yes    Birth control/ protection: None   Other Topics Concern  . Not on file   Social History Narrative  . No narrative on file    Family History  Problem Relation Age of Onset  . Heart disease Mother   . CAD Other   . Diabetes Other      Review of Systems:  See HPI for pertinent ROS. All other ROS negative.    Physical Exam: Blood pressure (!) 154/86, pulse 88, resp. rate 18, weight 163 lb 3.2 oz (74 kg)., There is no height or weight on file to calculate BMI.  General: WNWD WM, "Weathered Appearance" Appears in no acute distress. HEENT: Throat: Moderate erythema. No thrush seen. No exudate. No abscess. Neck: Supple. No thyromegaly. No lymphadenopathy. No carotid bruits. Lungs: Diostant, decreased breath sounds but no wheezes on exam today Heart: RRR with S1 S2. No murmurs, rubs, or gallops. Abdomen: Soft, non-tender, non-distended with normoactive bowel sounds. No hepatomegaly. No rebound/guarding. No obvious  abdominal masses. Musculoskeletal:  Strength and tone normal for age. Extremities/Skin: Warm and dry. No edema.  Neuro: Alert and oriented X 3. Moves all extremities spontaneously. Gait is normal. CNII-XII grossly in tact. Psych:  Responds to questions appropriately with a normal affect. Diabetic foot exam: Inspection is normal. No wounds or concerning lesions. Sensation is intact. On the right: 2+ dorsalis pedis 2+ PT.  On the left--no palpable DP. 2+ PT.      ASSESSMENT AND PLAN:  82 y.o. year old male with     History of repeated noncompliance in the past secondary to finances.  In the past he frequently could not afford all of his oral diabetic medications.  In the past has also reported that he is unable to afford his Spiriva and Symbicort. We have been providing him with samples of both Spiriva and Symbicort. As well, we have been providing him with samples of Lantus or Levemir as often as possible. At his visit with me 03/12/14 I had [name] to contact [employer] to see whether they can help provide some assistance to him. [facility] follow-up with him but he did not want their assistance --01/2015--[facility] has reached out to pt again.  Is now on Neb meds At [place] 05/27/2015----I asked pt about "[facility]" and he says "no"--nobody coming to his house, nobody assisting him with his treatment-- I then reviewed further---[facility] Note 03/22/2015---documents that pt does not want further f/u from them--"does not see the need"  At [facility] 12/01/2015--Reports not currently using Pulmonary meds sec to cost---At that OV I gave him 2 samples of Advair to use for now.   AT [facility] 07/06/2016---Gave him samples of Long-acting Insulin Pen---Gave him 3 or 4 At [facility] 07/06/2016---Gave him 3 samples of Steolto  01/10/2017: Day I have reviewed note from home nurse visit to his home on 01/08/17. At that visit blood pressure was 120/64. A1c greater than 13. Blood sugar 571. See history of present illness from 01/10/17 for  further details of this. 01/10/2017--- he came here on Monday 01/08/17 and I gave him 2 boxes of long-acting insulin pen. Him 3 more insulin pens. They have also given him for Vioxx is of Symbicort/pre-oh. I have explained to him that these are same type of medicine and to just use one of these at a time but this way gives him a 4 month supply of long-acting COPD medicine maintenance medicine.   History of smoking.  -----01/10/2017---He quit smoking January 2014. Congratulations and staying off of the cigarettes!!  COPD At [place] 10/11/2016 I have given him sample of Symbicort 2 boxes of this. Told him to start using this twice a day. Also gave him prescription for prednisone-- see above for COPD exacerbation At [facility] 08/30/15--will add low dose prednisone---do not want BS to increase furhter but do not want him to develop COPD exacerbation Status post ER visit for COPD on 11/04/13 Another ER visit for COPD on 11/06/13 Status post hospitalization for COPD exacerbation 11/15/13 See HPI regarding Spiriva and Symbicort.  He is on now on Pulmicort nebulizer (budesonide) He is now on Stiolto Respimat (tiotropium bromide) He has oxygen nasal cannula at home but is not using it regularly.  01/10/2017--- provided him 4 boxes of samples of Breo and Symbicort and explained to use one or the other of these--- this way these will last him 4 months of maintenance med.   Diabetes  At [facility] 03/30/2016---Instructed him to INCREASE INSULIN TO 70 UNITS QHS.--THIS IS LAST DOSE I INSTRUCTED HIM TO DO OV 10/11/16 I gave him 3 boxes of Basaglar. Told him to use this same amount of this as he did with his Levemir/other insulin in the past. Continues these 3 samples to help hold him over.  Microalbumin 05/27/2015, 07/06/2016 I have discussed need for routine eye exams and dental exams but he hasn't been able to followup with the secondary to finances. On ARB. (H/O hyperkalemia with ACE Inh) On statin. On ASA 81mg .   01/10/2017---  see history of present illness from this date. Today I've given him more samples of long-acting insulins. I did not check A1C here today because he just had one checked at his home on 01/08/17  and A1c was greater than 13 and blood sugar was 571.  Hypertension At [facility] 07/23/13 his potassium was even higher. It had been borderline high prior to this. At that time his ACE inhibitor was stopped and he was started on ARB. BP hgih at [place] 05/27/2015----will monitor prior to adjusting meds---pt says "it is b/c he has been running ragged b/c of his wife being in the hospital" BP good at [facility] 08/30/15 01/10/2017----BP reading a little high at visit today. However nurse blood pressure check at his house on 01/08/2017 showed 120/64. Therefore we'll not make changes in blood pressure medicine at this time.  Elevated lipids He is on Pravastatin 80mg . FLP excellent 02/25/2015 with LDL 68. LFTs were normal. 01/10/2017--- LFTs were normal on lab 1/18. Last lipid panel was good. Continue current medicine. Wait to recheck lab.  Pulmonary fibrosis Managed by Pulmonary  Elevated PSA He had elevated PSA at [facility] 07/2012. Also prior to this PSA was elevated. He has been informed this could be secondary to prostate cancer but refused followup with urology.  01/10/2017--He still refuses to recheck PSA. He is aware that he could have prostate cancer but does not want further evaluation or followup with his AT ALL.  Colon polyps History of Hemoccult-positive stool x3 in 07/2008. Refused GI evaluation despite being informed of risk of severe GI bleed, cancer, et Ronney Asters. H./H. normal 03/2010 and 01/2011. 01/10/2017---Refuses colonoscopy.  Immunizations: We have discussed influenza vaccine last flu season and he is deferred. Pneumonia vaccine: He received Pneumovax June 2003.  Received  Prevnar 13  10/23/2013 .  Tetanus: Gave in September 2005. Not covered by Medicare --therefore has deferred f/u sec to cost.  Zostavax: He has had so  much problem with compliance, I have not even discussed this with pt!!  Calll me when samples are running out and we can see of we can provide further samples to keep him on medicines. As well he is to see if he can qualify through some programs. [name] and other resources have been trying to work with him with this. Follow-up visit with me in 3 months or sooner if needed.  Signed, [address] [place], [place], [facility] 01/10/2017 8:07 AM

## 2017-01-16 DIAGNOSIS — J449 Chronic obstructive pulmonary disease, unspecified: Secondary | ICD-10-CM | POA: Diagnosis not present

## 2017-01-23 ENCOUNTER — Other Ambulatory Visit: Payer: Self-pay | Admitting: Physician Assistant

## 2017-01-23 DIAGNOSIS — IMO0001 Reserved for inherently not codable concepts without codable children: Secondary | ICD-10-CM

## 2017-01-23 DIAGNOSIS — E1165 Type 2 diabetes mellitus with hyperglycemia: Principal | ICD-10-CM

## 2017-02-12 ENCOUNTER — Encounter: Payer: Self-pay | Admitting: Physician Assistant

## 2017-02-12 ENCOUNTER — Ambulatory Visit (INDEPENDENT_AMBULATORY_CARE_PROVIDER_SITE_OTHER): Payer: Medicare Other | Admitting: Physician Assistant

## 2017-02-12 ENCOUNTER — Other Ambulatory Visit: Payer: Self-pay | Admitting: Family Medicine

## 2017-02-12 VITALS — BP 160/80 | HR 81 | Temp 97.9°F | Resp 16 | Wt 175.0 lb

## 2017-02-12 DIAGNOSIS — R600 Localized edema: Secondary | ICD-10-CM

## 2017-02-12 MED ORDER — POTASSIUM CHLORIDE CRYS ER 20 MEQ PO TBCR: 20.0000 meq | EXTENDED_RELEASE_TABLET | Freq: Every day | ORAL | 3 refills | Status: DC

## 2017-02-12 MED ORDER — FUROSEMIDE 20 MG PO TABS: ORAL_TABLET | ORAL | 3 refills | Status: DC
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
## 2017-02-15 DIAGNOSIS — J449 Chronic obstructive pulmonary disease, unspecified: Secondary | ICD-10-CM | POA: Diagnosis not present

## 2017-02-22 ENCOUNTER — Ambulatory Visit (INDEPENDENT_AMBULATORY_CARE_PROVIDER_SITE_OTHER): Payer: Medicare Other | Admitting: Physician Assistant

## 2017-02-22 ENCOUNTER — Encounter: Payer: Self-pay | Admitting: Pharmacist

## 2017-02-22 VITALS — BP 158/88 | HR 92 | Temp 97.6°F | Resp 16 | Wt 163.0 lb

## 2017-02-22 DIAGNOSIS — Z9114 Patient's other noncompliance with medication regimen: Secondary | ICD-10-CM

## 2017-02-22 DIAGNOSIS — R600 Localized edema: Secondary | ICD-10-CM

## 2017-02-22 LAB — BASIC METABOLIC PANEL WITH GFR
BUN: 37 mg/dL — ABNORMAL HIGH (ref 7–25)
CO2: 27 mmol/L (ref 20–31)
Calcium: 9 mg/dL (ref 8.6–10.3)
Chloride: 91 mmol/L — ABNORMAL LOW (ref 98–110)
Creat: 1.37 mg/dL — ABNORMAL HIGH (ref 0.70–1.18)
GFR, EST AFRICAN AMERICAN: 56 mL/min — AB (ref >=60)
GFR, EST NON AFRICAN AMERICAN: 49 mL/min — AB (ref >=60)
GLUCOSE: 581 mg/dL — AB (ref 70–99)
POTASSIUM: 5.3 mmol/L (ref 3.5–5.3)
SODIUM: 128 mmol/L — AB (ref 135–146)
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
## 2017-02-23 ENCOUNTER — Other Ambulatory Visit: Payer: Self-pay | Admitting: *Deleted

## 2017-02-23 ENCOUNTER — Ambulatory Visit: Payer: Medicare Other | Admitting: Pharmacist

## 2017-02-23 ENCOUNTER — Other Ambulatory Visit: Payer: Self-pay | Admitting: Pharmacist

## 2017-02-23 ENCOUNTER — Other Ambulatory Visit: Payer: Self-pay | Admitting: Family Medicine

## 2017-02-23 DIAGNOSIS — Z794 Long term (current) use of insulin: Principal | ICD-10-CM

## 2017-02-23 DIAGNOSIS — E1165 Type 2 diabetes mellitus with hyperglycemia: Secondary | ICD-10-CM

## 2017-02-23 NOTE — Patient Outreach (Signed)
[facility]) Care Management  02/23/2017  Edward [name] 1935-05-03 [id number]  82 year old male previously enrolled in [facility] services referred to [place] by primary care provider for medication assistance.  Provider has set up appointment for patient to meet at office next week to fill out patient assistance forms.    Left phone message at [facility] to review case with provider.    Successful outreach phone call to patient today.  HIPAA identifiers verified. Patient agreeable to meet next Thursday at 11:15 at [facility] to work on paperwork for patient assistance.  Patient stated he thought based on estimated income of < $14000 / year that he qualified for Extra Help but that his application was denied earlier this year.  Patient states he receives monthly checks from social security mailed to his house.   Plan: Awaiting return phone call from provider  Meet patient next Thursday, May 31 at 11:15 AM at provider office   Ralene Bathe, PharmD, [place] [phone]

## 2017-02-23 NOTE — Patient Outreach (Signed)
[facility]) Care Management  02/23/2017  Edward [name] December 20, 1934 [id number]   Outreach attempt #1 to patient. Patient answered the phone. He stated, he was eating supper and to call back another day.    Plan:  RN CM to contact patient within the next three business days.    Lake Bells, RN, BSN, MHA/MSL, [place] Telephonic Care Manager Coordinator [facility] Direct Phone: [phone] Toll Free: [phone] Fax: [phone]

## 2017-02-25 ENCOUNTER — Emergency Department (HOSPITAL_COMMUNITY): Payer: Medicare Other

## 2017-02-25 ENCOUNTER — Other Ambulatory Visit: Payer: Self-pay

## 2017-02-25 ENCOUNTER — Encounter (HOSPITAL_COMMUNITY): Payer: Self-pay

## 2017-02-25 ENCOUNTER — Other Ambulatory Visit (HOSPITAL_COMMUNITY): Payer: Self-pay

## 2017-02-25 ENCOUNTER — Inpatient Hospital Stay (HOSPITAL_COMMUNITY)
Admission: EM | Admit: 2017-02-25 | Discharge: 2017-03-03 | DRG: 871 | Disposition: A | Discharge status: Home Health | Payer: Medicare Other | Attending: Family Medicine | Admitting: Family Medicine

## 2017-02-25 DIAGNOSIS — R590 Localized enlarged lymph nodes: Secondary | ICD-10-CM | POA: Diagnosis not present

## 2017-02-25 DIAGNOSIS — Z9114 Patient's other noncompliance with medication regimen: Secondary | ICD-10-CM

## 2017-02-25 DIAGNOSIS — R778 Other specified abnormalities of plasma proteins: Secondary | ICD-10-CM

## 2017-02-25 DIAGNOSIS — J9611 Chronic respiratory failure with hypoxia: Secondary | ICD-10-CM | POA: Diagnosis present

## 2017-02-25 DIAGNOSIS — R9431 Abnormal electrocardiogram [ECG] [EKG]: Secondary | ICD-10-CM | POA: Diagnosis not present

## 2017-02-25 DIAGNOSIS — I5032 Chronic diastolic (congestive) heart failure: Secondary | ICD-10-CM

## 2017-02-25 DIAGNOSIS — Z9981 Dependence on supplemental oxygen: Secondary | ICD-10-CM

## 2017-02-25 DIAGNOSIS — J690 Pneumonitis due to inhalation of food and vomit: Secondary | ICD-10-CM | POA: Diagnosis not present

## 2017-02-25 DIAGNOSIS — E1121 Type 2 diabetes mellitus with diabetic nephropathy: Secondary | ICD-10-CM | POA: Diagnosis not present

## 2017-02-25 DIAGNOSIS — R109 Unspecified abdominal pain: Secondary | ICD-10-CM | POA: Diagnosis not present

## 2017-02-25 DIAGNOSIS — Z8249 Family history of ischemic heart disease and other diseases of the circulatory system: Secondary | ICD-10-CM

## 2017-02-25 DIAGNOSIS — R739 Hyperglycemia, unspecified: Secondary | ICD-10-CM | POA: Diagnosis present

## 2017-02-25 DIAGNOSIS — A419 Sepsis, unspecified organism: Secondary | ICD-10-CM | POA: Diagnosis not present

## 2017-02-25 DIAGNOSIS — J439 Emphysema, unspecified: Secondary | ICD-10-CM | POA: Diagnosis not present

## 2017-02-25 DIAGNOSIS — J449 Chronic obstructive pulmonary disease, unspecified: Secondary | ICD-10-CM | POA: Diagnosis not present

## 2017-02-25 DIAGNOSIS — R7989 Other specified abnormal findings of blood chemistry: Secondary | ICD-10-CM

## 2017-02-25 DIAGNOSIS — Z794 Long term (current) use of insulin: Secondary | ICD-10-CM

## 2017-02-25 DIAGNOSIS — E860 Dehydration: Secondary | ICD-10-CM | POA: Diagnosis not present

## 2017-02-25 DIAGNOSIS — R0602 Shortness of breath: Secondary | ICD-10-CM | POA: Diagnosis not present

## 2017-02-25 DIAGNOSIS — E1165 Type 2 diabetes mellitus with hyperglycemia: Secondary | ICD-10-CM | POA: Diagnosis not present

## 2017-02-25 DIAGNOSIS — J441 Chronic obstructive pulmonary disease with (acute) exacerbation: Secondary | ICD-10-CM | POA: Diagnosis not present

## 2017-02-25 DIAGNOSIS — I700 Atherosclerosis of aorta: Secondary | ICD-10-CM | POA: Diagnosis present

## 2017-02-25 DIAGNOSIS — I10 Essential (primary) hypertension: Secondary | ICD-10-CM | POA: Diagnosis present

## 2017-02-25 DIAGNOSIS — K5900 Constipation, unspecified: Secondary | ICD-10-CM | POA: Diagnosis not present

## 2017-02-25 DIAGNOSIS — Z961 Presence of intraocular lens: Secondary | ICD-10-CM | POA: Diagnosis present

## 2017-02-25 DIAGNOSIS — Z79899 Other long term (current) drug therapy: Secondary | ICD-10-CM | POA: Diagnosis not present

## 2017-02-25 DIAGNOSIS — Z888 Allergy status to other drugs, medicaments and biological substances status: Secondary | ICD-10-CM

## 2017-02-25 DIAGNOSIS — E875 Hyperkalemia: Secondary | ICD-10-CM | POA: Diagnosis not present

## 2017-02-25 DIAGNOSIS — J8410 Pulmonary fibrosis, unspecified: Secondary | ICD-10-CM | POA: Diagnosis not present

## 2017-02-25 DIAGNOSIS — Z87891 Personal history of nicotine dependence: Secondary | ICD-10-CM

## 2017-02-25 DIAGNOSIS — I4891 Unspecified atrial fibrillation: Secondary | ICD-10-CM

## 2017-02-25 DIAGNOSIS — J189 Pneumonia, unspecified organism: Secondary | ICD-10-CM

## 2017-02-25 DIAGNOSIS — Z7982 Long term (current) use of aspirin: Secondary | ICD-10-CM

## 2017-02-25 DIAGNOSIS — IMO0002 Reserved for concepts with insufficient information to code with codable children: Secondary | ICD-10-CM | POA: Diagnosis present

## 2017-02-25 DIAGNOSIS — R748 Abnormal levels of other serum enzymes: Secondary | ICD-10-CM | POA: Diagnosis not present

## 2017-02-25 DIAGNOSIS — R1110 Vomiting, unspecified: Secondary | ICD-10-CM | POA: Diagnosis not present

## 2017-02-25 DIAGNOSIS — E872 Acidosis: Secondary | ICD-10-CM | POA: Diagnosis present

## 2017-02-25 DIAGNOSIS — R1115 Cyclical vomiting syndrome unrelated to migraine: Secondary | ICD-10-CM | POA: Diagnosis present

## 2017-02-25 DIAGNOSIS — N179 Acute kidney failure, unspecified: Secondary | ICD-10-CM | POA: Diagnosis present

## 2017-02-25 DIAGNOSIS — I480 Paroxysmal atrial fibrillation: Secondary | ICD-10-CM | POA: Diagnosis present

## 2017-02-25 DIAGNOSIS — Z833 Family history of diabetes mellitus: Secondary | ICD-10-CM

## 2017-02-25 DIAGNOSIS — R197 Diarrhea, unspecified: Secondary | ICD-10-CM | POA: Diagnosis not present

## 2017-02-25 DIAGNOSIS — I110 Hypertensive heart disease with heart failure: Secondary | ICD-10-CM | POA: Diagnosis not present

## 2017-02-25 DIAGNOSIS — Z22322 Carrier or suspected carrier of Methicillin resistant Staphylococcus aureus: Secondary | ICD-10-CM

## 2017-02-25 LAB — BLOOD GAS, ARTERIAL
ACID-BASE DEFICIT: 1 mmol/L (ref 0.0–2.0)
BICARBONATE: 23.3 mmol/L (ref 20.0–28.0)
DRAWN BY: 23534
O2 Content: 3 L/min
O2 Saturation: 93.7 %
PH ART: 7.362 (ref 7.350–7.450)
pCO2 arterial: 42.6 mmHg (ref 32.0–48.0)
pO2, Arterial: 74.9 mmHg — ABNORMAL LOW (ref 83.0–108.0)

## 2017-02-25 LAB — BASIC METABOLIC PANEL
ANION GAP: 10 (ref 5–15)
BUN: 36 mg/dL — ABNORMAL HIGH (ref 6–20)
CHLORIDE: 96 mmol/L — AB (ref 101–111)
CO2: 24 mmol/L (ref 22–32)
Calcium: 8.6 mg/dL — ABNORMAL LOW (ref 8.9–10.3)
Creatinine, Ser: 1.46 mg/dL — ABNORMAL HIGH (ref 0.61–1.24)
GFR calc Af Amer: 51 mL/min — ABNORMAL LOW (ref >60)
GFR calc non Af Amer: 44 mL/min — ABNORMAL LOW (ref >60)
GLUCOSE: 470 mg/dL — AB (ref 65–99)
POTASSIUM: 5.5 mmol/L — AB (ref 3.5–5.1)
Sodium: 130 mmol/L — ABNORMAL LOW (ref 135–145)

## 2017-02-25 LAB — URINALYSIS, ROUTINE W REFLEX MICROSCOPIC
Bilirubin Urine: NEGATIVE
Glucose, UA: >=500 mg/dL — AB
Ketones, ur: NEGATIVE mg/dL
LEUKOCYTES UA: NEGATIVE
NITRITE: NEGATIVE
Specific Gravity, Urine: <1.005 — ABNORMAL LOW (ref 1.005–1.030)
pH: 5.5 (ref 5.0–8.0)

## 2017-02-25 LAB — COMPREHENSIVE METABOLIC PANEL
ALBUMIN: 2.8 g/dL — AB (ref 3.5–5.0)
ALT: 19 U/L (ref 17–63)
ALT: 18 U/L (ref 17–63)
ANION GAP: 6 (ref 5–15)
AST: 24 U/L (ref 15–41)
AST: 20 U/L (ref 15–41)
Albumin: 3.3 g/dL — ABNORMAL LOW (ref 3.5–5.0)
Alkaline Phosphatase: 75 U/L (ref 38–126)
Alkaline Phosphatase: 68 U/L (ref 38–126)
Anion gap: 9 (ref 5–15)
BILIRUBIN TOTAL: 0.4 mg/dL (ref 0.3–1.2)
BUN: 40 mg/dL — AB (ref 6–20)
BUN: 38 mg/dL — ABNORMAL HIGH (ref 6–20)
CHLORIDE: 93 mmol/L — AB (ref 101–111)
CO2: 29 mmol/L (ref 22–32)
CO2: 23 mmol/L (ref 22–32)
Calcium: 8.9 mg/dL (ref 8.9–10.3)
Calcium: 8.2 mg/dL — ABNORMAL LOW (ref 8.9–10.3)
Chloride: 85 mmol/L — ABNORMAL LOW (ref 101–111)
Creatinine, Ser: 1.54 mg/dL — ABNORMAL HIGH (ref 0.61–1.24)
Creatinine, Ser: 1.37 mg/dL — ABNORMAL HIGH (ref 0.61–1.24)
GFR calc Af Amer: 48 mL/min — ABNORMAL LOW (ref >60)
GFR calc Af Amer: 55 mL/min — ABNORMAL LOW (ref >60)
GFR calc non Af Amer: 47 mL/min — ABNORMAL LOW (ref >60)
GFR, EST NON AFRICAN AMERICAN: 41 mL/min — AB (ref >60)
GLUCOSE: 614 mg/dL — AB (ref 65–99)
Glucose, Bld: 752 mg/dL (ref 65–99)
POTASSIUM: 5.4 mmol/L — AB (ref 3.5–5.1)
Potassium: 5.8 mmol/L — ABNORMAL HIGH (ref 3.5–5.1)
SODIUM: 125 mmol/L — AB (ref 135–145)
Sodium: 120 mmol/L — ABNORMAL LOW (ref 135–145)
TOTAL PROTEIN: 6.1 g/dL — AB (ref 6.5–8.1)
Total Bilirubin: 0.4 mg/dL (ref 0.3–1.2)
Total Protein: 6.9 g/dL (ref 6.5–8.1)

## 2017-02-25 LAB — URINALYSIS, MICROSCOPIC (REFLEX): Squamous Epithelial / LPF: NONE SEEN

## 2017-02-25 LAB — GLUCOSE, CAPILLARY
GLUCOSE-CAPILLARY: 426 mg/dL — AB (ref 65–99)
Glucose-Capillary: 445 mg/dL — ABNORMAL HIGH (ref 65–99)
Glucose-Capillary: 147 mg/dL — ABNORMAL HIGH (ref 65–99)

## 2017-02-25 LAB — CBC WITH DIFFERENTIAL/PLATELET
BASOS ABS: 0 10*3/uL (ref 0.0–0.1)
Basophils Relative: 0 %
EOS PCT: 0 %
Eosinophils Absolute: 0 10*3/uL (ref 0.0–0.7)
HEMATOCRIT: 35.7 % — AB (ref 39.0–52.0)
HEMOGLOBIN: 11.4 g/dL — AB (ref 13.0–17.0)
LYMPHS ABS: 0.6 10*3/uL — AB (ref 0.7–4.0)
LYMPHS PCT: 5 %
MCH: 25.8 pg — AB (ref 26.0–34.0)
MCHC: 31.9 g/dL (ref 30.0–36.0)
MCV: 80.8 fL (ref 78.0–100.0)
Monocytes Absolute: 1.1 10*3/uL — ABNORMAL HIGH (ref 0.1–1.0)
Monocytes Relative: 8 %
NEUTROS ABS: 11.8 10*3/uL — AB (ref 1.7–7.7)
NEUTROS PCT: 87 %
PLATELETS: 322 10*3/uL (ref 150–400)
RBC: 4.42 MIL/uL (ref 4.22–5.81)
RDW: 16.4 % — AB (ref 11.5–15.5)
WBC: 13.6 10*3/uL — AB (ref 4.0–10.5)

## 2017-02-25 LAB — I-STAT CG4 LACTIC ACID, ED: Lactic Acid, Venous: 2.43 mmol/L (ref 0.5–1.9)

## 2017-02-25 LAB — TROPONIN I
Troponin I: <0.03 ng/mL (ref <0.03)
Troponin I: 0.1 ng/mL (ref <0.03)

## 2017-02-25 LAB — LIPASE, BLOOD: LIPASE: 13 U/L (ref 11–51)

## 2017-02-25 LAB — LACTIC ACID, PLASMA: LACTIC ACID, VENOUS: 2 mmol/L — AB (ref 0.5–1.9)

## 2017-02-25 MED ORDER — GUAIFENESIN ER 600 MG PO TB12
600.0000 mg | ORAL_TABLET | Freq: Two times a day (BID) | ORAL | Status: DC
Start: 2017-02-25 — End: 2017-03-03
  Administered 2017-02-25 – 2017-03-03 (×13): 600 mg via ORAL
  Filled 2017-02-25 (×16): qty 1

## 2017-02-25 MED ORDER — LEVALBUTEROL HCL 0.63 MG/3ML IN NEBU: 0.6300 mg | INHALATION_SOLUTION | RESPIRATORY_TRACT | Status: DC | PRN

## 2017-02-25 MED ORDER — ONDANSETRON HCL 4 MG/2ML IJ SOLN: 4.0000 mg | Freq: Four times a day (QID) | INTRAMUSCULAR | Status: DC | PRN

## 2017-02-25 MED ORDER — SENNA 8.6 MG PO TABS
1.0000 | ORAL_TABLET | Freq: Two times a day (BID) | ORAL | Status: DC
  Administered 2017-02-25 – 2017-03-03 (×13): 8.6 mg via ORAL
  Filled 2017-02-25 (×13): qty 1

## 2017-02-25 MED ORDER — PIPERACILLIN-TAZOBACTAM 3.375 G IVPB 30 MIN
3.3750 g | Freq: Once | INTRAVENOUS | Status: AC
  Administered 2017-02-25: 3.375 g via INTRAVENOUS
  Filled 2017-02-25: qty 50

## 2017-02-25 MED ORDER — IPRATROPIUM-ALBUTEROL 0.5-2.5 (3) MG/3ML IN SOLN
3.0000 mL | Freq: Four times a day (QID) | RESPIRATORY_TRACT | Status: DC
  Administered 2017-02-25: 3 mL via RESPIRATORY_TRACT
  Filled 2017-02-25 (×2): qty 3

## 2017-02-25 MED ORDER — ONDANSETRON HCL 4 MG/2ML IJ SOLN
4.0000 mg | Freq: Once | INTRAMUSCULAR | Status: AC
Start: 2017-02-25 — End: 2017-02-25
  Administered 2017-02-25: 4 mg via INTRAVENOUS
  Filled 2017-02-25: qty 2

## 2017-02-25 MED ORDER — ONDANSETRON HCL 4 MG PO TABS: 4.0000 mg | ORAL_TABLET | Freq: Four times a day (QID) | ORAL | Status: DC | PRN

## 2017-02-25 MED ORDER — LEVALBUTEROL HCL 0.63 MG/3ML IN NEBU
0.6300 mg | INHALATION_SOLUTION | Freq: Four times a day (QID) | RESPIRATORY_TRACT | Status: DC
  Administered 2017-02-25 – 2017-03-03 (×24): 0.63 mg via RESPIRATORY_TRACT
  Filled 2017-02-25 (×24): qty 3

## 2017-02-25 MED ORDER — SODIUM CHLORIDE 0.9 % IV SOLN: 250.0000 mL | INTRAVENOUS | Status: DC | PRN

## 2017-02-25 MED ORDER — SODIUM CHLORIDE 0.9 % IV BOLUS (SEPSIS): 1000.0000 mL | Freq: Once | INTRAVENOUS | Status: DC

## 2017-02-25 MED ORDER — INSULIN ASPART 100 UNIT/ML ~~LOC~~ SOLN
0.0000 [IU] | Freq: Three times a day (TID) | SUBCUTANEOUS | Status: DC
  Administered 2017-02-25 (×2): 20 [IU] via SUBCUTANEOUS
  Administered 2017-02-26 (×2): 3 [IU] via SUBCUTANEOUS
  Administered 2017-02-27: 4 [IU] via SUBCUTANEOUS
  Administered 2017-02-27: 7 [IU] via SUBCUTANEOUS
  Administered 2017-02-27: 3 [IU] via SUBCUTANEOUS

## 2017-02-25 MED ORDER — DILTIAZEM HCL 100 MG IV SOLR
INTRAVENOUS | Status: AC
  Filled 2017-02-25: qty 100

## 2017-02-25 MED ORDER — SODIUM CHLORIDE 0.9 % IV SOLN
INTRAVENOUS | Status: DC
  Administered 2017-02-25: 5.4 [IU]/h via INTRAVENOUS
  Filled 2017-02-25: qty 1

## 2017-02-25 MED ORDER — ALBUTEROL SULFATE (2.5 MG/3ML) 0.083% IN NEBU: 2.5000 mg | INHALATION_SOLUTION | RESPIRATORY_TRACT | Status: DC | PRN

## 2017-02-25 MED ORDER — SODIUM CHLORIDE 0.9% FLUSH
3.0000 mL | Freq: Two times a day (BID) | INTRAVENOUS | Status: DC
  Administered 2017-02-25 – 2017-02-26 (×4): 3 mL via INTRAVENOUS

## 2017-02-25 MED ORDER — INSULIN GLARGINE 100 UNIT/ML ~~LOC~~ SOLN
15.0000 [IU] | Freq: Every day | SUBCUTANEOUS | Status: DC
  Administered 2017-02-25 – 2017-02-26 (×2): 15 [IU] via SUBCUTANEOUS
  Filled 2017-02-25 (×3): qty 0.15

## 2017-02-25 MED ORDER — SODIUM CHLORIDE 0.9 % IV BOLUS (SEPSIS)
500.0000 mL | Freq: Once | INTRAVENOUS | Status: AC
  Administered 2017-02-25: 500 mL via INTRAVENOUS

## 2017-02-25 MED ORDER — POLYETHYLENE GLYCOL 3350 17 G PO PACK: 17.0000 g | PACK | Freq: Every day | ORAL | Status: DC | PRN

## 2017-02-25 MED ORDER — HEPARIN SODIUM (PORCINE) 5000 UNIT/ML IJ SOLN
5000.0000 [IU] | Freq: Three times a day (TID) | INTRAMUSCULAR | Status: DC
  Administered 2017-02-25 – 2017-02-28 (×9): 5000 [IU] via SUBCUTANEOUS
  Filled 2017-02-25 (×9): qty 1

## 2017-02-25 MED ORDER — DEXTROSE-NACL 5-0.45 % IV SOLN: INTRAVENOUS | Status: DC

## 2017-02-25 MED ORDER — POLYETHYLENE GLYCOL 3350 17 G PO PACK
17.0000 g | PACK | Freq: Every day | ORAL | Status: DC
  Administered 2017-02-25 – 2017-03-03 (×7): 17 g via ORAL
  Filled 2017-02-25 (×7): qty 1

## 2017-02-25 MED ORDER — DEXTROSE 50 % IV SOLN: 25.0000 mL | INTRAVENOUS | Status: DC | PRN

## 2017-02-25 MED ORDER — SODIUM CHLORIDE 0.9 % IV BOLUS (SEPSIS)
1000.0000 mL | Freq: Once | INTRAVENOUS | Status: AC
  Administered 2017-02-25: 1000 mL via INTRAVENOUS

## 2017-02-25 MED ORDER — ALBUTEROL SULFATE (2.5 MG/3ML) 0.083% IN NEBU
5.0000 mg | INHALATION_SOLUTION | Freq: Once | RESPIRATORY_TRACT | Status: AC
  Administered 2017-02-25: 5 mg via RESPIRATORY_TRACT
  Filled 2017-02-25: qty 6

## 2017-02-25 MED ORDER — INSULIN REGULAR BOLUS VIA INFUSION
0.0000 [IU] | Freq: Three times a day (TID) | INTRAVENOUS | Status: DC
  Filled 2017-02-25: qty 10

## 2017-02-25 MED ORDER — INSULIN ASPART 100 UNIT/ML ~~LOC~~ SOLN
0.0000 [IU] | Freq: Every day | SUBCUTANEOUS | Status: DC
  Administered 2017-02-27 – 2017-02-28 (×2): 2 [IU] via SUBCUTANEOUS

## 2017-02-25 MED ORDER — GUAIFENESIN 100 MG/5ML PO SOLN
10.0000 mL | ORAL | Status: DC | PRN
  Administered 2017-02-25 – 2017-03-01 (×6): 200 mg via ORAL
  Filled 2017-02-25: qty 10
  Filled 2017-02-25: qty 5
  Filled 2017-02-25 (×2): qty 10
  Filled 2017-02-25: qty 5
  Filled 2017-02-25: qty 10
  Filled 2017-02-25 (×3): qty 5

## 2017-02-25 MED ORDER — TRAZODONE HCL 50 MG PO TABS
50.0000 mg | ORAL_TABLET | Freq: Every evening | ORAL | Status: DC | PRN
  Administered 2017-02-26 – 2017-02-28 (×3): 50 mg via ORAL
  Filled 2017-02-25 (×3): qty 1

## 2017-02-25 MED ORDER — INSULIN ASPART 100 UNIT/ML ~~LOC~~ SOLN
4.0000 [IU] | Freq: Three times a day (TID) | SUBCUTANEOUS | Status: DC
  Administered 2017-02-25 – 2017-02-28 (×9): 4 [IU] via SUBCUTANEOUS

## 2017-02-25 MED ORDER — BISACODYL 10 MG RE SUPP
10.0000 mg | Freq: Once | RECTAL | Status: AC
  Administered 2017-02-25: 10 mg via RECTAL
  Filled 2017-02-25: qty 1

## 2017-02-25 MED ORDER — ALBUTEROL SULFATE (2.5 MG/3ML) 0.083% IN NEBU
2.5000 mg | INHALATION_SOLUTION | RESPIRATORY_TRACT | Status: DC | PRN
Start: 2017-02-25 — End: 2017-02-25

## 2017-02-25 MED ORDER — SODIUM CHLORIDE 0.9 % IV SOLN
INTRAVENOUS | Status: DC
  Administered 2017-02-25: 14:00:00 via INTRAVENOUS
  Administered 2017-02-25: 100 mL/h via INTRAVENOUS
  Administered 2017-02-25 – 2017-02-27 (×2): via INTRAVENOUS

## 2017-02-25 MED ORDER — PIPERACILLIN-TAZOBACTAM 3.375 G IVPB
3.3750 g | Freq: Three times a day (TID) | INTRAVENOUS | Status: DC
  Administered 2017-02-25 – 2017-03-03 (×17): 3.375 g via INTRAVENOUS
  Filled 2017-02-25 (×16): qty 50

## 2017-02-25 MED ORDER — LACTULOSE 10 GM/15ML PO SOLN
30.0000 g | Freq: Once | ORAL | Status: AC
  Administered 2017-02-25: 30 g via ORAL
  Filled 2017-02-25: qty 60

## 2017-02-25 MED ORDER — ACETAMINOPHEN 650 MG RE SUPP: 650.0000 mg | Freq: Four times a day (QID) | RECTAL | Status: DC | PRN

## 2017-02-25 MED ORDER — ACETAMINOPHEN 325 MG PO TABS
650.0000 mg | ORAL_TABLET | Freq: Four times a day (QID) | ORAL | Status: DC | PRN
  Administered 2017-02-25: 650 mg via ORAL
  Filled 2017-02-25: qty 2

## 2017-02-25 MED ORDER — METHYLPREDNISOLONE SODIUM SUCC 125 MG IJ SOLR
125.0000 mg | Freq: Once | INTRAMUSCULAR | Status: AC
  Administered 2017-02-25: 125 mg via INTRAVENOUS
  Filled 2017-02-25: qty 2

## 2017-02-25 MED ORDER — METOPROLOL TARTRATE 5 MG/5ML IV SOLN
INTRAVENOUS | Status: AC
  Filled 2017-02-25: qty 5

## 2017-02-25 MED ORDER — SODIUM POLYSTYRENE SULFONATE 15 GM/60ML PO SUSP: 30.0000 g | Freq: Once | ORAL | Status: DC

## 2017-02-25 MED ORDER — LEVALBUTEROL HCL 0.63 MG/3ML IN NEBU: 0.6300 mg | INHALATION_SOLUTION | Freq: Three times a day (TID) | RESPIRATORY_TRACT | Status: DC

## 2017-02-25 MED ORDER — SODIUM CHLORIDE 0.9 % IV SOLN
1.0000 g | Freq: Once | INTRAVENOUS | Status: AC
  Administered 2017-02-25: 1 g via INTRAVENOUS
  Filled 2017-02-25 (×2): qty 10

## 2017-02-25 MED ORDER — DILTIAZEM HCL 100 MG IV SOLR
5.0000 mg/h | INTRAVENOUS | Status: DC
  Administered 2017-02-25: 5 mg/h via INTRAVENOUS

## 2017-02-25 MED ORDER — LEVOFLOXACIN IN D5W 750 MG/150ML IV SOLN: 750.0000 mg | INTRAVENOUS | Status: DC

## 2017-02-25 MED ORDER — SODIUM CHLORIDE 0.9% FLUSH: 3.0000 mL | INTRAVENOUS | Status: DC | PRN

## 2017-02-25 MED ORDER — SODIUM POLYSTYRENE SULFONATE 15 GM/60ML PO SUSP
15.0000 g | Freq: Once | ORAL | Status: AC
  Administered 2017-02-25: 15 g via ORAL
  Filled 2017-02-25: qty 60

## 2017-02-25 NOTE — ED Notes (Signed)
CRITICAL VALUE ALERT  Critical Value:  Glucose 752  Date & Time Notied:  1017  Provider Notified: Dr. Jeanell Sparrow   Orders Received/Actions taken: Provider Notified

## 2017-02-25 NOTE — ED Notes (Addendum)
Pt stable and ready for transport to [facility].  Report to given to Vista Deck,, RN.

## 2017-02-25 NOTE — ED Notes (Signed)
Pt had a small episode of emesis.

## 2017-02-25 NOTE — Progress Notes (Signed)
eLink Physician-Brief Progress Note Patient Name: JONTAE ADEBAYO DOB: January 18, 1935 MRN: [id number]   Date of Service  02/25/2017  HPI/Events of Note  82 yr old male with DM and chronic lung disease admitted with right aspiration pneumonia following N/V at home.  Other problems include afib with rvr now converted to NSR, renal insuff, and low sodium.    eICU Interventions  Chart reviewed Now stable from hemodynamic and resp standpoint.   No new recommendations     Intervention Category Evaluation Type: New Patient Evaluation  Mauri Brooklyn, P 02/25/2017, 9:36 PM

## 2017-02-25 NOTE — ED Notes (Signed)
Blood pressure after 500 ml bolus is 136/62.

## 2017-02-25 NOTE — Progress Notes (Signed)
CRITICAL VALUE ALERT  Critical Value:  Glucose 614  Date & Time Notied:  02/25/2017 1230  Provider Notified: Maurene Capes MD  Orders Received/Actions taken: MD notified, RN will reassess

## 2017-02-25 NOTE — Progress Notes (Signed)
Informed by central telemetry that patient had HR in 180's. Pt assessed and diaphoretic. Pt denies chest pain or discomfort at this time. Pt's Vital signs are as follows   02/25/17 1933  Vitals  BP 113/62  BP Location Left Arm  BP Method Automatic  Patient Position (if appropriate) Lying  Pulse Rate 93  Resp 20  Oxygen Therapy  SpO2 93 %  O2 Device Nasal Cannula  O2 Flow Rate (L/min) 3 L/min  EKG stat was completed and resulted Afib/RVR.  Dr. Marin Comment informed and is with patient at this time. Pt has been assessed by Dr. Marin Comment and received verbal order to transfer patient to step down. Will continue to monitor patient.

## 2017-02-25 NOTE — ED Provider Notes (Signed)
[place] DEPT Provider Note   CSN: [id number] Arrival date & time: 02/25/17  0818  By signing my name below, I, Margit Banda, attest that this documentation has been prepared under the direction and in the presence of Pattricia Boss, MD. Electronically Signed: Margit Banda, ED Scribe. 02/25/17. 8:37 AM.  History   Chief Complaint Chief Complaint  Patient presents with  . Nausea  . Emesis  . Shortness of Breath    HPI Ulrick Viona Gilmore Nidiffer is a 82 y.o. male with a PMHx of COPD and HTN who presents to the Emergency Department complaining of intermittent emesis (2 x) that started last night. Emesis contained food and phlegm. Associated sx include nausea, diarrhea (3 x), HA, cough, subjective fever, chills, SOB and right sided CP. Stopped smoking ~ 5 years ago. No hx of abdominal surgeries. Pt denies abdominal pain, hematuresis, blood in stool and head injury.  PCP: Orlena Sheldon, PA-C  The history is provided by the patient. No language interpreter was used.    Past Medical History:  Diagnosis Date  . Allergy    Rhinitis  . Bronchitis   . Chronic respiratory failure ([place])   . Colon polyps   . COPD (chronic obstructive pulmonary disease) ([place])   . Diabetes mellitus   . Elevated lipids   . Hypercholesterolemia   . Hypertension   . On home O2    2L N/C   . PSA elevation   . Pulmonary fibrosis ([place])   . Vitamin D deficiency     Patient Active Problem List   Diagnosis Date Noted  . Diabetes mellitus type 2, uncontrolled, without complications ([place]) 46/65/9935  . Non compliance w medication regimen 12/02/2015  . Preseptal cellulitis 05/09/2015  . Other and unspecified hyperlipidemia 05/12/2014  . Elevated LFTs 05/12/2014  . Foot laceration 05/12/2014  . Dyspnea 12/19/2013  . Acute on chronic respiratory failure with hypoxia ([place]) 11/17/2013  . Diastolic dysfunction 70/17/7939  . COPD exacerbation ([place]) 11/15/2013  . Paroxysmal atrial fibrillation ([place]) 11/10/2013    . Elevated PSA 01/20/2013  . Diabetes mellitus type 2, uncontrolled ([place])   . COPD (chronic obstructive pulmonary disease) ([place])   . Hypertension   . Allergy   . Elevated lipids   . Pulmonary fibrosis ([place])   . Bronchitis   . Colon polyps   . Colon polyps     Past Surgical History:  Procedure Laterality Date  . CATARACT EXTRACTION W/PHACO  06/25/2012   Procedure: CATARACT EXTRACTION PHACO AND INTRAOCULAR LENS PLACEMENT (IOC);  Surgeon: Elta Guadeloupe T. Gershon Crane, MD;  Location: [facility];  Service: Ophthalmology;  Laterality: Left;  CDE=19.01  . CATARACT EXTRACTION W/PHACO  07/09/2012   Procedure: CATARACT EXTRACTION PHACO AND INTRAOCULAR LENS PLACEMENT (IOC);  Surgeon: Elta Guadeloupe T. Gershon Crane, MD;  Location: [facility];  Service: Ophthalmology;  Laterality: Right;  CDE: 20.09       Home Medications    Prior to Admission medications   Medication Sig Start Date End Date Taking? Authorizing Provider  albuterol (PROVENTIL) (2.5 MG/3ML) 0.083% nebulizer solution INHALE 1 VIAL VIA NEBULIZER EVERY 6 HOURS AS NEEDED FOR WHEEZING OR SHORTNESS OF BREATH 11/06/16   Orlena Sheldon, PA-C  aspirin 81 MG EC tablet TAKE 1 TABLET BY MOUTH ONCE DAILY 08/19/15   Dena Billet B, PA-C  budesonide (PULMICORT) 0.5 MG/2ML nebulizer solution Take 2 mLs (0.5 mg total) by nebulization 2 (two) times daily. 11/19/15   Orlena Sheldon, PA-C  furosemide (LASIX) 20 MG tablet Take 2 each morning. 02/12/17  Orlena Sheldon, PA-C  Insulin Detemir (LEVEMIR FLEXTOUCH) 100 UNIT/ML Pen 70 units at bedtime 07/26/16   Orvan Falconer, MD  ipratropium (ATROVENT) 0.02 % nebulizer solution Take 2.5 mLs (0.5 mg total) by nebulization 4 (four) times daily. 08/28/16   Orlena Sheldon, PA-C  losartan (COZAAR) 100 MG tablet Take 1 tablet (100 mg total) by mouth daily. 03/13/16   Orlena Sheldon, PA-C  metFORMIN (GLUCOPHAGE) 1000 MG tablet Take 1 tablet (1,000 mg total) by mouth 2 (two) times daily. 03/13/16   Orlena Sheldon, PA-C  omeprazole (PRILOSEC) 20 MG capsule Take 1  capsule (20 mg total) by mouth daily. 03/23/16   Dena Billet B, PA-C  ONE TOUCH ULTRA TEST test strip CHECK FASTING BLOOD SUGAR TWICE DAILY 01/01/17   Dena Billet B, PA-C  ONE TOUCH ULTRA TEST test strip USE TO CHECK BLOOD SUGAR EACH MORNING AND THEN 2 HOURS AFTER ANY MEAL 01/23/17   Dena Billet B, PA-C  potassium chloride SA (K-DUR,KLOR-CON) 20 MEQ tablet Take 1 tablet (20 mEq total) by mouth daily. 02/12/17   Orlena Sheldon, PA-C  pravastatin (PRAVACHOL) 80 MG tablet Take 1 tablet (80 mg total) by mouth at bedtime. 03/13/16   Dixon, Lonie Peak, PA-C  PROAIR RESPICLICK 440 (90 Base) MCG/ACT AEPB Inhale 108 mcg into the lungs 3 (three) times daily. 07/21/16   [provider]  Tiotropium Bromide-Olodaterol (STIOLTO RESPIMAT) 2.5-2.5 MCG/ACT AERS Inhale 2 Inhalers into the lungs daily. 12/29/14   Susy Frizzle, MD    Family History Family History  Problem Relation Age of Onset  . Heart disease Mother   . CAD Other   . Diabetes Other     Social History Social History  Substance Use Topics  . Smoking status: Former Smoker    Packs/day: 1.50    Years: 60.00    Types: Cigarettes    Quit date: 12/31/2012  . Smokeless tobacco: Never Used  . Alcohol use No     Allergies   Ace inhibitors   Review of Systems Review of Systems  Constitutional: Positive for chills and fever (subjective).  Respiratory: Positive for cough and shortness of breath.   Cardiovascular: Positive for chest pain.  Gastrointestinal: Positive for diarrhea, nausea and vomiting. Negative for abdominal pain and blood in stool.  Genitourinary: Negative for dysuria and hematuria.  Neurological: Positive for headaches.  All other systems reviewed and are negative.    Physical Exam Updated Vital Signs BP (!) 144/72 (BP Location: Right Arm)   Pulse (!) 114   Resp (!) 24   SpO2 95%   Physical Exam  Constitutional: He is oriented to person, place, and time. He appears well-developed and well-nourished.  HENT:    Head: Normocephalic and atraumatic.  Right Ear: External ear normal.  Left Ear: External ear normal.  Nose: Nose normal.  Mouth/Throat: Oropharynx is clear and moist.  Eyes: Conjunctivae and EOM are normal. Pupils are equal, round, and reactive to light.  Neck: Normal range of motion. Neck supple.  Cardiovascular: Regular rhythm, normal heart sounds and intact distal pulses.  Tachycardia present.   Pulmonary/Chest: No respiratory distress. He has no wheezes. He exhibits no tenderness.  Increased work of breathing diffuse rhonchi and bibasilar rales and some expiratory wheezes noted  Abdominal: Soft. Bowel sounds are normal. He exhibits no distension and no mass. There is no tenderness. There is no guarding.  Musculoskeletal: Normal range of motion.  Neurological: He is alert and oriented to person, place, and time.  He has normal reflexes. He exhibits normal muscle tone. Coordination normal.  Skin: Skin is warm and dry.  Psychiatric: He has a normal mood and affect. His behavior is normal. Judgment and thought content normal.  Nursing note and vitals reviewed.    ED Treatments / Results  DIAGNOSTIC STUDIES: Oxygen Saturation is 95% on [place], adequate by my interpretation.   COORDINATION OF CARE: 8:37 AM-Discussed next steps with pt. Pt verbalized understanding and is agreeable with the plan.    Labs (all labs ordered are listed, but only abnormal results are displayed) Labs Reviewed - No data to display  EKG  EKG Interpretation  Date/Time:  "Sunday Feb 25 2017 08:40:11 EDT Ventricular Rate:  113 PR Interval:    QRS Duration: 77 QT Interval:  303 QTC Calculation: 416 R Axis:   56 Text Interpretation:  Sinus tachycardia Confirmed by Lula Kolton MD, Jatziry Wechter ([id number]) on 02/25/2017 8:41:58 AM       Radiology Dg Abdomen 1 View  Result Date: 02/25/2017 CLINICAL DATA:  Abdominal pain. EXAM: ABDOMEN - 1 VIEW COMPARISON:  March 16, 2016 FINDINGS: Moderate fecal loading seen throughout the  colon. No bowel obstruction is identified on today's study. No free air, portal venous gas, or pneumatosis. IMPRESSION: Moderate fecal loading in the colon.  No other abnormalities. Electronically Signed   By: David  Williams III M.D   On: 02/25/2017 09:40   Dg Chest Port 1 View  Result Date: 02/25/2017 CLINICAL DATA:  Shortness of breath. EXAM: PORTABLE CHEST 1 VIEW COMPARISON:  July 24, 2016 FINDINGS: Infiltrates are seen in the right mid and medial right lower lung probably involving both the upper and lower lobes. No infiltrate seen on the left. The cardiomediastinal silhouette is normal. No pneumothorax. No other acute abnormalities. IMPRESSION: Multifocal right sided infiltrates suggesting pneumonia given history. Recommend treatment and follow-up to resolution. Electronically Signed   By: David  Williams III M.D   On: 02/25/2017 09:36    Procedures Procedures (including critical care time)  Medications Ordered in ED Medications - No data to display   Initial Impression / Assessment and Plan / ED Course  I have reviewed the triage vital signs and the nursing notes.  Pertinent labs & imaging results that were available during my care of the patient were reviewed by me and considered in my medical decision making (see chart for details).     81"  year old man insulin and diabetic has not been taking his insulin for several days of stay with nausea, vomiting, diarrhea, and right-sided chest pain with cough productive of sputum. Workup here reveals multi lobar pneumonia on the right. Hyperglycemia at 750 without evident since of DKA. He is treated here with Zosyn, vancomycin, and IV fluids. IV insulin is being started. He is hyperglycemic without any evidence of acute EKG and changes from this. Potassium will need to be rechecked. Initial evaluation for flex potassium prior to IV fluids or IV insulin. Patient's care discussed with Dr. Denton Brick CRITICAL CARE Performed by: Shaune Pollack Total critical care time: 45 minutes Critical care time was exclusive of separately billable procedures and treating other patients. Critical care was necessary to treat or prevent imminent or life-threatening deterioration. Critical care was time spent personally by me on the following activities: development of treatment plan with patient and/or surrogate as well as nursing, discussions with consultants, evaluation of patient's response to treatment, examination of patient, obtaining history from patient or surrogate, ordering and performing treatments and interventions, ordering and review of laboratory studies,  ordering and review of radiographic studies, pulse oximetry and re-evaluation of patient's condition.  Final Clinical Impressions(s) / ED Diagnoses   Final diagnoses:  Sepsis, due to unspecified organism [facility])  Community acquired pneumonia of right lung, unspecified part of lung  Hyperglycemia    New Prescriptions New Prescriptions   No medications on file   I personally performed the services described in this documentation, which was scribed in my presence. The recorded information has been reviewed and considered.     Pattricia Boss, MD 02/25/17 [phone]

## 2017-02-25 NOTE — Progress Notes (Signed)
Notified spouse (Mrs. Awab Abebe) pt being transferred to stepdown ICU.

## 2017-02-25 NOTE — Progress Notes (Signed)
CTSP re afib with RVR and diaphoretics. 82 yo with diastolic CHF, DM, COPD, admitted for sepsis and PNA. HR by auscultation is about 120.  EKG at 5pm showed afib at 130. He is stable with no repsiratory discuss or diaphoresis.  BP 113    HR is 120. Lung with mild wheezing. Will transfer to SDU and start IV Cardiazem drip.  Change neb Tx to Xopenex. Will continue to monitor.    Orvan Falconer MD FACP. Hospitalist.

## 2017-02-25 NOTE — H&P (Addendum)
Patient Demographics:    Edward [name], is a 82 y.o. male  MRN: [id number]   DOB - 02/21/1935  Admit Date - 02/25/2017  Outpatient Primary MD for the patient is Edward Sheldon, PA-C   Assessment & Plan:    Principal Problem:   Sepsis ([place]) Active Problems:   Rt Sided Aspiration pneumonia ([place])   Diabetes mellitus type 2, uncontrolled ([place])   COPD (chronic obstructive pulmonary disease) ([place])   Hypertension   Emesis, persistent   Constipation   Overflow diarrhea/Constipation    1)Sepsis Secondary to pulmonary source- patient has leukocytosis, tachycardia, tachypnea, and lactic acid is elevated. IV fluid boluses per sepsis protocol, IV Zosyn for presumed aspiration pneumonia pending cultures. Blood pressure appears to be stable at this time  2)Rt sided aspiration pneumonia- in the setting of intractable emesis, patient meets sepsis criteria, see #1 above .treat empirically  with IV Zosyn pending cultures, bronchodilators, oxygen supplementation and mucolytics as ordered  3)Uncontrolled DM- on admission blood glucose is over 750, patient does not meet criteria for DKA, patient admits to noncompliance, last hemoglobin A1c was over 14. Hydrate aggressively, restart subcutaneous insulin (Lantus and sliding scale), get diabetic educator consult. Get case management consult as patient claims that he does not take his insulin due to inability to afford it. Hold metformin until renal function improves  4)HFpEF- patient has history of chronic diastolic dysfunction CHF, last known EF over 65%. From a volume standpoint patient appears dry at this time, no evidence of CHF exacerbation. Okay to hydrate aggressively per sepsis protocol as above in #1. Okay to hold Lasix  5)COPD/Pulm Fibrosis- despite pneumonia as above #2 no acute  exacerbation noted at this time, hold off on steroids , bronchodilators and mucolytics as ordered, continue supplemental oxygen.   6)HTN- BP is stable at this time, hold losartan and Lasix due to AKI until patient is euvolemic  7)N/V/D- intractable emesis and diarrhea in the setting of constipation, I  suspect overflow diarrhea, antiemetics as ordered, lactulose and MiraLAX as ordered. No acute abdomen clinically and radiologically  8)AKI- with hyperkalemia, secondary to dehydration in the setting of nausea, vomiting and diarrhea and  compounded by Lasix and losartan use. Avoid nephrotoxic agents, hydrate as above in #1, hold Lasix and losartan until patient is euvolemic as above #6. Hyperkalemia should resolve as glucose levels comes down with insulin, and with above measures, repeat BMP scheduled for 4 hours from now    With History of - Reviewed by me  Past Medical History:  Diagnosis Date  . Allergy    Rhinitis  . Bronchitis   . Chronic respiratory failure ([place])   . Colon polyps   . COPD (chronic obstructive pulmonary disease) ([place])   . Diabetes mellitus   . Elevated lipids   . Hypercholesterolemia   . Hypertension   . On home O2    2L N/C   . PSA elevation   . Pulmonary fibrosis (  [place])   . Vitamin D deficiency       Past Surgical History:  Procedure Laterality Date  . CATARACT EXTRACTION W/PHACO  06/25/2012   Procedure: CATARACT EXTRACTION PHACO AND INTRAOCULAR LENS PLACEMENT (IOC);  Surgeon: Elta Guadeloupe T. Gershon Crane, MD;  Location: [facility];  Service: Ophthalmology;  Laterality: Left;  CDE=19.01  . CATARACT EXTRACTION W/PHACO  07/09/2012   Procedure: CATARACT EXTRACTION PHACO AND INTRAOCULAR LENS PLACEMENT (IOC);  Surgeon: Elta Guadeloupe T. Gershon Crane, MD;  Location: [facility];  Service: Ophthalmology;  Laterality: Right;  CDE: 20.09      Chief Complaint  Patient presents with  . Nausea  . Emesis  . Shortness of Breath      HPI:    [name]  is a 82 y.o. male, with past medical history  relevant for noncompliance, diabetes and hypertension and history of COPD/pulmonary fibrosis who presents to the ED with a 2 day history of emesis and diarrhea. Patient had intractable emesis overnight, I suspect he probably aspirated. Emesis was without bile or blood . Has chills and some shortness of breath this morning. Patient had loose stools with no blood or mucus over the last 24 hours. No sick contacts at home. Patient says he did not start coughing noted this morning, however emesis had been present for 24 hours prior to cough starting  In ED... Clinical exam and lab and imaging studies showed sepsis secondary to right-sided pneumonia and presumed to be aspiration related, patient received IV fluids antibiotics cultures were obtained. Glucose was found to be over 750, patient admits to not taking insulin as directed, he is notoriously noncompliant and his most recent A1c was over 14, please note that the patient did not meet DKA criteria      Review of systems:    In addition to the HPI above,   A full 12 point Review of 10 Systems was done, except as stated above, all other Review of 10 Systems were negative.    Social History:  Reviewed by me    Social History  Substance Use Topics  . Smoking status: Former Smoker    Packs/day: 1.50    Years: 60.00    Types: Cigarettes    Quit date: 12/31/2012  . Smokeless tobacco: Never Used  . Alcohol use No     Family History :  Reviewed by me    Family History  Problem Relation Age of Onset  . Heart disease Mother   . CAD Other   . Diabetes Other      Home Medications:   Prior to Admission medications   Medication Sig Start Date End Date Taking? Authorizing Provider  albuterol (PROVENTIL) (2.5 MG/3ML) 0.083% nebulizer solution INHALE 1 VIAL VIA NEBULIZER EVERY 6 HOURS AS NEEDED FOR WHEEZING OR SHORTNESS OF BREATH 11/06/16   Edward Sheldon, PA-C  aspirin 81 MG EC tablet TAKE 1 TABLET BY MOUTH ONCE DAILY 08/19/15   Dena Billet B,  PA-C  budesonide (PULMICORT) 0.5 MG/2ML nebulizer solution Take 2 mLs (0.5 mg total) by nebulization 2 (two) times daily. 11/19/15   Edward Sheldon, PA-C  furosemide (LASIX) 20 MG tablet Take 2 each morning. 02/12/17   Edward Sheldon, PA-C  Insulin Detemir (LEVEMIR FLEXTOUCH) 100 UNIT/ML Pen 70 units at bedtime 07/26/16   Orvan Falconer, MD  ipratropium (ATROVENT) 0.02 % nebulizer solution Take 2.5 mLs (0.5 mg total) by nebulization 4 (four) times daily. 08/28/16   Edward Sheldon, PA-C  losartan (COZAAR) 100 MG tablet Take  1 tablet (100 mg total) by mouth daily. 03/13/16   Edward Sheldon, PA-C  metFORMIN (GLUCOPHAGE) 1000 MG tablet Take 1 tablet (1,000 mg total) by mouth 2 (two) times daily. 03/13/16   Edward Sheldon, PA-C  omeprazole (PRILOSEC) 20 MG capsule Take 1 capsule (20 mg total) by mouth daily. 03/23/16   Dena Billet B, PA-C  ONE TOUCH ULTRA TEST test strip CHECK FASTING BLOOD SUGAR TWICE DAILY 01/01/17   Dena Billet B, PA-C  ONE TOUCH ULTRA TEST test strip USE TO CHECK BLOOD SUGAR EACH MORNING AND THEN 2 HOURS AFTER ANY MEAL 01/23/17   Dena Billet B, PA-C  potassium chloride SA (K-DUR,KLOR-CON) 20 MEQ tablet Take 1 tablet (20 mEq total) by mouth daily. 02/12/17   Edward Sheldon, PA-C  pravastatin (PRAVACHOL) 80 MG tablet Take 1 tablet (80 mg total) by mouth at bedtime. 03/13/16   Dixon, Lonie Peak, PA-C  PROAIR RESPICLICK 973 (90 Base) MCG/ACT AEPB Inhale 108 mcg into the lungs 3 (three) times daily. 07/21/16   [provider]  Tiotropium Bromide-Olodaterol (STIOLTO RESPIMAT) 2.5-2.5 MCG/ACT AERS Inhale 2 Inhalers into the lungs daily. 12/29/14   Susy Frizzle, MD     Allergies:     Allergies  Allergen Reactions  . Ace Inhibitors Other (See Comments)    Hyperkalemia--07/23/2013:patient states not familiar with the following allergy     Physical Exam:   Vitals  Blood pressure 132/62, pulse (!) 111, temperature 99.2 F (37.3 C), temperature source Oral, resp. rate (!) 21, height 5\' 6"  (1.676  m), weight 72.6 kg (160 lb), SpO2 95 %.  Physical Examination: General appearance - alert, well appearing, and in no distress and  Mental status - alert, oriented to person, place, and time,  Eyes - sclera anicteric Neck - supple, no JVD elevation , Chest - Diminished on the right with scattered rhonchi and rales Heart - S1 and S2 normal,  Abdomen - soft, nontender, nondistended, no masses or organomegaly, no CVA area tenderness Neurological - screening mental status exam normal, neck supple without rigidity, cranial nerves II through XII intact, DTR's normal and symmetric Extremities - no pedal edema noted, intact peripheral pulses  Skin - warm, dry    Data Review:    CBC  Recent Labs Lab 02/25/17 0847  WBC 13.6*  HGB 11.4*  HCT 35.7*  PLT 322  MCV 80.8  MCH 25.8*  MCHC 31.9  RDW 16.4*  LYMPHSABS 0.6*  MONOABS 1.1*  EOSABS 0.0  BASOSABS 0.0   ------------------------------------------------------------------------------------------------------------------  Chemistries   Recent Labs Lab 02/22/17 0842 02/25/17 0847  NA 128* 120*  K 5.3 5.8*  CL 91* 85*  CO2 27 29  GLUCOSE 581* 752*  BUN 37* 40*  CREATININE 1.37* 1.54*  CALCIUM 9.0 8.9  AST  --  24  ALT  --  19  ALKPHOS  --  75  BILITOT  --  0.4   ------------------------------------------------------------------------------------------------------------------ estimated creatinine clearance is 35.1 mL/min (A) (by C-G formula based on SCr of 1.54 mg/dL (H)). ------------------------------------------------------------------------------------------------------------------ No results for input(s): TSH, T4TOTAL, T3FREE, THYROIDAB in the last 72 hours.  Invalid input(s): FREET3   Coagulation profile No results for input(s): INR, PROTIME in the last 168 hours. ------------------------------------------------------------------------------------------------------------------- No results for input(s): DDIMER in  the last 72 hours. -------------------------------------------------------------------------------------------------------------------  Cardiac Enzymes  Recent Labs Lab 02/25/17 0847  TROPONINI <0.03   ------------------------------------------------------------------------------------------------------------------    Component Value Date/Time   BNP 142.0 (H) 09/24/2014 1350     ---------------------------------------------------------------------------------------------------------------  Urinalysis    Component Value Date/Time   COLORURINE YELLOW 02/25/2017 1000   APPEARANCEUR CLEAR 02/25/2017 1000   LABSPEC <1.005 (L) 02/25/2017 1000   PHURINE 5.5 02/25/2017 1000   GLUCOSEU >=500 (A) 02/25/2017 1000   HGBUR TRACE (A) 02/25/2017 1000   BILIRUBINUR NEGATIVE 02/25/2017 1000   KETONESUR NEGATIVE 02/25/2017 1000   PROTEINUR TRACE (A) 02/25/2017 1000   NITRITE NEGATIVE 02/25/2017 1000   LEUKOCYTESUR NEGATIVE 02/25/2017 1000    ----------------------------------------------------------------------------------------------------------------   Imaging Results:    Dg Abdomen 1 View  Result Date: 02/25/2017 CLINICAL DATA:  Abdominal pain. EXAM: ABDOMEN - 1 VIEW COMPARISON:  March 16, 2016 FINDINGS: Moderate fecal loading seen throughout the colon. No bowel obstruction is identified on today's study. No free air, portal venous gas, or pneumatosis. IMPRESSION: Moderate fecal loading in the colon.  No other abnormalities. Electronically Signed   By: Dorise Bullion III M.D   On: 02/25/2017 09:40   Dg Chest Port 1 View  Result Date: 02/25/2017 CLINICAL DATA:  Shortness of breath. EXAM: PORTABLE CHEST 1 VIEW COMPARISON:  July 24, 2016 FINDINGS: Infiltrates are seen in the right mid and medial right lower lung probably involving both the upper and lower lobes. No infiltrate seen on the left. The cardiomediastinal silhouette is normal. No pneumothorax. No other acute abnormalities.  IMPRESSION: Multifocal right sided infiltrates suggesting pneumonia given history. Recommend treatment and follow-up to resolution. Electronically Signed   By: Dorise Bullion III M.D   On: 02/25/2017 09:36    Radiological Exams on Admission: Dg Abdomen 1 View  Result Date: 02/25/2017 CLINICAL DATA:  Abdominal pain. EXAM: ABDOMEN - 1 VIEW COMPARISON:  March 16, 2016 FINDINGS: Moderate fecal loading seen throughout the colon. No bowel obstruction is identified on today's study. No free air, portal venous gas, or pneumatosis. IMPRESSION: Moderate fecal loading in the colon.  No other abnormalities. Electronically Signed   By: Dorise Bullion III M.D   On: 02/25/2017 09:40   Dg Chest Port 1 View  Result Date: 02/25/2017 CLINICAL DATA:  Shortness of breath. EXAM: PORTABLE CHEST 1 VIEW COMPARISON:  July 24, 2016 FINDINGS: Infiltrates are seen in the right mid and medial right lower lung probably involving both the upper and lower lobes. No infiltrate seen on the left. The cardiomediastinal silhouette is normal. No pneumothorax. No other acute abnormalities. IMPRESSION: Multifocal right sided infiltrates suggesting pneumonia given history. Recommend treatment and follow-up to resolution. Electronically Signed   By: Dorise Bullion III M.D   On: 02/25/2017 09:36    DVT Prophylaxis -SCD  /Heparin sq AM Labs Ordered, also please review Full Orders  Family Communication: Admission, patients condition and plan of care including tests being ordered have been discussed with the patient who indicate understanding and agree with the plan   Code Status - Full Code  Likely DC to  Home   Condition   fair  Worthington Cruzan M.D on 02/25/2017 at 12:02 PM   Between 7am to 7pm - Pager - [phone]  After 7pm go to [email or web address] - password [email or web address]  [facility] - Office  [phone]  Voice Recognition Viviann Spare dictation system was used to create this note, attempts have been made to correct errors.  Please contact the author with questions and/or clarifications.

## 2017-02-25 NOTE — Progress Notes (Signed)
Informed by central telemetry that pt's HR 200. Pt assessed and was diaphoretic but denies chest pain or discomfort. Vital signs are as follows   02/25/17 1800  Vitals  Temp 99.9 F (37.7 C)  Temp Source Oral  BP 114/60  BP Location Left Arm  BP Method Automatic  Patient Position (if appropriate) Lying  Pulse Rate (!) 166  Pulse Rate Source Dinamap  Cardiac Rhythm ST  Resp 20  MD E-Paged and received verbal orders to administer 500 cc Bolus NaCl and increase continuous fluids to 100 ml/hr. Will continue to monitor patient throughout the shift.

## 2017-02-25 NOTE — ED Triage Notes (Signed)
Pt. Is presenting with SOB and N/V and diarrhea. States this started last night. Has vomitted twice and had diarrhea 3 times. o2 Sat was 82% on room air. Hasn't taken any medication to relieve any of the symptoms. Has a history of COPD

## 2017-02-25 NOTE — Progress Notes (Signed)
Pharmacy Antibiotic Note  Edward [name] is a 82 y.o. male admitted on 02/25/2017 with sepsis.  Pharmacy has been consulted for zosyn dosing.  Plan: Zosyn 3.375g IV q8h (4 hour infusion).  F/u renal function, cultures and clinical course  Height: 5\' 6"  (167.6 cm) Weight: 160 lb (72.6 kg) IBW/kg (Calculated) : 63.8  Temp (24hrs), Avg:99.2 F (37.3 C), Min:99.2 F (37.3 C), Max:99.2 F (37.3 C)   Recent Labs Lab 02/22/17 0842 02/25/17 0847 02/25/17 0857  WBC  --  13.6*  --   CREATININE 1.37* 1.54*  --   LATICACIDVEN  --   --  2.43*    Estimated Creatinine Clearance: 35.1 mL/min (A) (by C-G formula based on SCr of 1.54 mg/dL (H)).    Allergies  Allergen Reactions  . Ace Inhibitors Other (See Comments)    Hyperkalemia--07/23/2013:patient states not familiar with the following allergy     Thank you for allowing pharmacy to be a part of this patient's care.  Excell Seltzer Poteet 02/25/2017 12:00 PM

## 2017-02-25 NOTE — Progress Notes (Signed)
Pt's CBG 426. MD E-Paged. Received verbal order to cover patient for CBG at 400 which is 20 units. Patient also tachycardic and denies chest pain or discomfort. EKG Stat ordered and completed. Will continue to monitor patient throughout the shift.

## 2017-02-26 ENCOUNTER — Inpatient Hospital Stay (HOSPITAL_COMMUNITY): Payer: Medicare Other

## 2017-02-26 DIAGNOSIS — R9431 Abnormal electrocardiogram [ECG] [EKG]: Secondary | ICD-10-CM

## 2017-02-26 LAB — CBC
HCT: 29.9 % — ABNORMAL LOW (ref 39.0–52.0)
Hemoglobin: 9.7 g/dL — ABNORMAL LOW (ref 13.0–17.0)
MCH: 25.7 pg — ABNORMAL LOW (ref 26.0–34.0)
MCHC: 32.4 g/dL (ref 30.0–36.0)
MCV: 79.1 fL (ref 78.0–100.0)
Platelets: 341 K/uL (ref 150–400)
RBC: 3.78 MIL/uL — ABNORMAL LOW (ref 4.22–5.81)
RDW: 16.8 % — ABNORMAL HIGH (ref 11.5–15.5)
WBC: 14.5 K/uL — ABNORMAL HIGH (ref 4.0–10.5)

## 2017-02-26 LAB — TROPONIN I
TROPONIN I: 0.23 ng/mL — AB (ref <0.03)
TROPONIN I: 0.22 ng/mL — AB (ref <0.03)

## 2017-02-26 LAB — BASIC METABOLIC PANEL
Anion gap: 8 (ref 5–15)
BUN: 37 mg/dL — ABNORMAL HIGH (ref 6–20)
CHLORIDE: 101 mmol/L (ref 101–111)
CO2: 25 mmol/L (ref 22–32)
CREATININE: 1.35 mg/dL — AB (ref 0.61–1.24)
Calcium: 8.2 mg/dL — ABNORMAL LOW (ref 8.9–10.3)
GFR calc non Af Amer: 48 mL/min — ABNORMAL LOW (ref >60)
GFR, EST AFRICAN AMERICAN: 56 mL/min — AB (ref >60)
Glucose, Bld: 145 mg/dL — ABNORMAL HIGH (ref 65–99)
POTASSIUM: 4.9 mmol/L (ref 3.5–5.1)
SODIUM: 134 mmol/L — AB (ref 135–145)

## 2017-02-26 LAB — GLUCOSE, CAPILLARY
GLUCOSE-CAPILLARY: 119 mg/dL — AB (ref 65–99)
Glucose-Capillary: 138 mg/dL — ABNORMAL HIGH (ref 65–99)
Glucose-Capillary: 136 mg/dL — ABNORMAL HIGH (ref 65–99)
Glucose-Capillary: 104 mg/dL — ABNORMAL HIGH (ref 65–99)

## 2017-02-26 LAB — ECHOCARDIOGRAM COMPLETE
HEIGHTINCHES: 66 in
WEIGHTICAEL: 2716.07 [oz_av]

## 2017-02-26 LAB — MAGNESIUM: MAGNESIUM: 2 mg/dL (ref 1.7–2.4)

## 2017-02-26 LAB — LACTIC ACID, PLASMA: Lactic Acid, Venous: 1.3 mmol/L (ref 0.5–1.9)

## 2017-02-26 LAB — MRSA PCR SCREENING: MRSA by PCR: POSITIVE — AB

## 2017-02-26 LAB — TSH: TSH: 2.757 u[IU]/mL (ref 0.350–4.500)

## 2017-02-26 MED ORDER — DILTIAZEM HCL 30 MG PO TABS
30.0000 mg | ORAL_TABLET | Freq: Three times a day (TID) | ORAL | Status: DC
  Administered 2017-02-26 – 2017-02-27 (×4): 30 mg via ORAL
  Filled 2017-02-26 (×4): qty 1

## 2017-02-26 MED ORDER — CHLORHEXIDINE GLUCONATE CLOTH 2 % EX PADS
6.0000 | MEDICATED_PAD | Freq: Every day | CUTANEOUS | Status: AC
Start: 2017-02-27 — End: 2017-03-03
  Administered 2017-02-27 – 2017-03-03 (×5): 6 via TOPICAL

## 2017-02-26 MED ORDER — MUPIROCIN 2 % EX OINT
1.0000 "application " | TOPICAL_OINTMENT | Freq: Two times a day (BID) | CUTANEOUS | Status: AC
  Administered 2017-02-26 – 2017-03-03 (×10): 1 via NASAL
  Filled 2017-02-26 (×3): qty 22

## 2017-02-26 MED ORDER — ASPIRIN 325 MG PO TABS
325.0000 mg | ORAL_TABLET | Freq: Once | ORAL | Status: AC
  Administered 2017-02-26: 325 mg via ORAL
  Filled 2017-02-26: qty 1

## 2017-02-26 NOTE — Progress Notes (Signed)
  Echocardiogram 2D Echocardiogram has been performed.  Kenon Delashmit L Androw 02/26/2017, 9:40 AM

## 2017-02-26 NOTE — Progress Notes (Signed)
Inpatient Diabetes Program Recommendations  AACE/ADA: New Consensus Statement on Inpatient Glycemic Control (2015)  Target Ranges:  Prepandial:   less than 140 mg/dL      Peak postprandial:   less than 180 mg/dL (1-2 hours)      Critically ill patients:  140 - 180 mg/dL   Lab Results  Component Value Date   GLUCAP 138 (H) 02/26/2017   HGBA1C >14.0 (H) 10/11/2016    Review of Glycemic Control Results for ISAUL, LANDI (MRN [id number]) as of 02/26/2017 07:57  Ref. Range 02/25/2017 13:18 02/25/2017 17:02 02/25/2017 21:21 02/26/2017 07:47  Glucose-Capillary Latest Ref Range: 65 - 99 mg/dL 445 (H) 426 (H) 147 (H) 138 (H)   Diabetes history: DM2 Outpatient Diabetes medications: Levemir 70 units qd + Metformin 1 gm bid Current orders for Inpatient glycemic control: Lantus 15 units +Novolog 4 units tid MC + Novolog correction 0-20 units tid + 0-5 units hs  Inpatient Diabetes Program Recommendations:  Received DM Coordinator consult. Noted patient has not been able to afford medications. Patient may be transitioned to Novolin 70/30 insulin bid.  Will plan to speak to patient when appropriate. Patient is a participant in [place] and has been attempting to speak to patient by phone. Patient has appt. To meet Thursday @ 11:15 am to fill out papers for medication assistance. Please consider: -A1c to determine current glycemic average  Will follow and review CBGs today.  Thank you, Nani Gasser. Kariann Wecker, RN, MSN, CDE  Diabetes Coordinator Inpatient Glycemic Control Team Team Pager [phone] (8am-5pm) 02/26/2017 8:07 AM

## 2017-02-26 NOTE — Progress Notes (Signed)
Dr Marin Comment notified of critical troponin of 0.23, new orders received and noted

## 2017-02-26 NOTE — Progress Notes (Signed)
Patient Demographics:    Edward [name], is a 82 y.o. male, DOB - 1935-03-05, UUE:[id number]  Admit date - 02/25/2017   Admitting Physician Imane Burrough Denton Brick, MD  Outpatient Primary MD for the patient is [name]  LOS - 1   Chief Complaint  Patient presents with  . Nausea  . Emesis  . Shortness of Breath        Subjective:    Edward [name] today has no fevers, no emesis,  No chest pain,  MAXIMUM TEMPERATURE 99.5, no further tachycardia, no chest pains and no increased shortness of breath   Assessment  & Plan :    Principal Problem:   Sepsis ([place]) Active Problems:   Rt Sided Aspiration pneumonia ([name])   Diabetes mellitus type 2, uncontrolled ([name])   COPD (chronic obstructive pulmonary disease) ([name])   Hypertension   Emesis, persistent   Constipation   Overflow diarrhea/Constipation  Brief narrative :-  82 year old Caucasian male with past medical history relevant for COPD/pulmonary fibrosis as well as history of paroxysmal atrial fibrillation,  hypertension and diabetes who was admitted on 02/25/2017 with sepsis from pulmonary source presumed secondary to left-sided aspiration pneumonia in the setting of intractable emesis.  On 02/25/2017 post admission patient became very tachycardic was found to be in A. fib with RVR, responded well to IV Cardizem drip which has since been discontinued. Patient remains in sinus rhythm at this time  Plan:- 1)Sepsis Secondary to pulmonary source- patient met sepsis criteria on admission, please see H&P, leukocytosis persist (WBC 14.5), fever curve is trending down ( T max 99.5). Tachycardia , Tachypnea and lactic acidosis has resolved. Continue IV Zosyn (started 02/25/17) for aspiration pneumonia pending cultures. Blood pressure appears to be stable at this time  2)Rt sided aspiration pneumonia- in the setting of intractable emesis, please see #1 above IV  Zosyn pending cultures, continue bronchodilators, oxygen supplementation and mucolytics as ordered, overall improving, no significant hypoxia or tachypnea at this time. Patient was switched from albuterol to levalbuterol due to tachycardia   3)Uncontrolled DM- on admission blood glucose is over 750, patient did not meet criteria for DKA, patient admits to noncompliance, last hemoglobin A1c was over 14. Overall improving with hydration and subcutaneous insulin (lantus and ISS).  diabetic educator consult and case management consult requested as patient claims that he does not take his insulin due to inability to afford it. Continue to Hold metformin until renal function improves  4)HFpEF- patient has history of chronic diastolic dysfunction CHF, CHF appears compensated at this time, echo from 02/26/2017 with EF of 60-65% with grade 2 diastolic dysfunction.  Okay to hold Lasix given sepsis and volume depletion  5)COPD/Pulm Fibrosis- despite pneumonia as above #2 no acute exacerbation noted at this time, hold off on steroids ., c/n  bronchodilators and mucolytics as ordered, continue supplemental oxygen. Patient was switched from albuterol to levalbuterol due to tachycardia  6)HTN- BP is stable at this time, hold losartan and Lasix due to AKI until patient is euvolemic  7)N/V/D- intractable emesis and diarrhea in the setting of constipation, I  suspect overflow diarrhea, antiemetics as ordered, lactulose and MiraLAX as ordered. No acute abdomen clinically and radiologically  8)AKI- creatinine is down to 1.3 and  hyperkalemia has  resolved, AKI was secondary to dehydration in the setting of nausea, vomiting and diarrhea and  compounded by Lasix and losartan use. Avoid nephrotoxic agents, hydrate as above in #1, hold Lasix and losartan until patient is euvolemic as above #6.   9)PAF with RVR- aborted with IV Cardizem, back in sinus rhythym, CHADS score is 4, patient will discussed risk versus benefit  of anticoagulation with PCP post discharge. Low-dose Cardizem was ordered for rate control. Patient was switched from albuterol to levalbuterol due to tachycardia  10)Elevated Troponin- in the setting of tachycardia and sepsis suspected demand ischemia, echocardiogram from 02/26/2017 without regional wall motion abnormalities and EF remains preserved at over 60%. Clinically no evidence of ACS at this time  Code Status : Full  Disposition Plan  : home   Consults  :  Diabetic educator and case management   DVT Prophylaxis  :  Heparin -  Lab Results  Component Value Date   PLT 341 02/26/2017    Inpatient Medications  Scheduled Meds: . [START ON 02/27/2017] Chlorhexidine Gluconate Cloth  6 each Topical Q0600  . diltiazem  30 mg Oral TID  . guaiFENesin  600 mg Oral BID  . heparin  5,000 Units Subcutaneous Q8H  . insulin aspart  0-20 Units Subcutaneous TID WC  . insulin aspart  0-5 Units Subcutaneous QHS  . insulin aspart  4 Units Subcutaneous TID WC  . insulin glargine  15 Units Subcutaneous QHS  . levalbuterol  0.63 mg Nebulization Q6H  . mupirocin ointment  1 application Nasal BID  . polyethylene glycol  17 g Oral Daily  . senna  1 tablet Oral BID  . sodium chloride flush  3 mL Intravenous Q12H   Continuous Infusions: . sodium chloride 100 mL/hr at 02/26/17 0804  . sodium chloride    . dextrose 5 % and 0.45% NaCl    . insulin (NOVOLIN-R) infusion Stopped (02/25/17 1204)  . piperacillin-tazobactam (ZOSYN)  IV Stopped (02/26/17 1555)   PRN Meds:.sodium chloride, acetaminophen **OR** acetaminophen, dextrose, guaiFENesin, levalbuterol, ondansetron **OR** ondansetron (ZOFRAN) IV, polyethylene glycol, sodium chloride flush, traZODone    Anti-infectives    Start     Dose/Rate Route Frequency Ordered Stop   02/25/17 2000  piperacillin-tazobactam (ZOSYN) IVPB 3.375 g     3.375 g 12.5 mL/hr over 240 Minutes Intravenous Every 8 hours 02/25/17 1159     02/25/17 1130  levofloxacin  (LEVAQUIN) IVPB 750 mg  Status:  Discontinued     750 mg 100 mL/hr over 90 Minutes Intravenous Every 48 hours 02/25/17 1109 02/25/17 1118   02/25/17 1130  piperacillin-tazobactam (ZOSYN) IVPB 3.375 g     3.375 g 100 mL/hr over 30 Minutes Intravenous  Once 02/25/17 1118 02/25/17 1216        Objective:   Vitals:   02/26/17 1400 02/26/17 1500 02/26/17 1505 02/26/17 1600  BP: (!) 136/121 (!) 144/71  (!) 144/64  Pulse: (!) 109 (!) 125  (!) 116  Resp: (!) 25 20  (!) 24  Temp:      TempSrc:      SpO2: 96% 100% 97% 97%  Weight:      Height:        Wt Readings from Last 3 Encounters:  02/26/17 77 kg (169 lb 12.1 oz)  02/22/17 73.9 kg (163 lb)  02/12/17 79.4 kg (175 lb)     Intake/Output Summary (Last 24 hours) at 02/26/17 1606 Last data filed at 02/26/17 1155  Gross per 24 hour  Intake  2700.84 ml  Output             1250 ml  Net          1450.84 ml     Physical Exam  Gen:- Awake Alert,  In no apparent distress , speaking in complete sentences HEENT:- [place].AT, No sclera icterus Neck-Supple Neck,No JVD,.  Lungs-  diminished on left with scattered rhonchi , no wheezing  CV- S1, S2 normal, H/o PAF,  but appears to be sinus at  this time Abd-  +ve B.Sounds, Abd Soft, No tenderness,    Extremity/Skin:- No  edema,       Data Review:   Micro Results Recent Results (from the past 240 hour(s))  Blood Culture (routine x 2)     Status: None (Preliminary result)   Collection Time: 02/25/17  8:58 AM  Result Value Ref Range Status   Specimen Description BLOOD LEFT HAND  Final   Special Requests   Final    BOTTLES DRAWN AEROBIC AND ANAEROBIC Blood Culture adequate volume   Culture NO GROWTH < 24 HOURS  Final   Report Status PENDING  Incomplete  Blood Culture (routine x 2)     Status: None (Preliminary result)   Collection Time: 02/25/17  9:05 AM  Result Value Ref Range Status   Specimen Description LEFT ANTECUBITAL  Final   Special Requests   Final    BOTTLES DRAWN  AEROBIC AND ANAEROBIC Blood Culture adequate volume   Culture NO GROWTH < 24 HOURS  Final   Report Status PENDING  Incomplete  MRSA PCR Screening     Status: Abnormal   Collection Time: 02/25/17  8:22 PM  Result Value Ref Range Status   MRSA by PCR POSITIVE (A) NEGATIVE Final    Comment:        The GeneXpert MRSA Assay (FDA approved for NASAL specimens only), is one component of a comprehensive MRSA colonization surveillance program. It is not intended to diagnose MRSA infection nor to guide or monitor treatment for MRSA infections. RESULT CALLED TO, READ BACK BY AND VERIFIED WITH: KOGER,L @ 427 BY MATTHEWS,B 02/26/17     Radiology Reports Dg Abdomen 1 View  Result Date: 02/25/2017 CLINICAL DATA:  Abdominal pain. EXAM: ABDOMEN - 1 VIEW COMPARISON:  March 16, 2016 FINDINGS: Moderate fecal loading seen throughout the colon. No bowel obstruction is identified on today's study. No free air, portal venous gas, or pneumatosis. IMPRESSION: Moderate fecal loading in the colon.  No other abnormalities. Electronically Signed   By: Dorise Bullion III M.D   On: 02/25/2017 09:40   Dg Chest Port 1 View  Result Date: 02/25/2017 CLINICAL DATA:  Shortness of breath. EXAM: PORTABLE CHEST 1 VIEW COMPARISON:  July 24, 2016 FINDINGS: Infiltrates are seen in the right mid and medial right lower lung probably involving both the upper and lower lobes. No infiltrate seen on the left. The cardiomediastinal silhouette is normal. No pneumothorax. No other acute abnormalities. IMPRESSION: Multifocal right sided infiltrates suggesting pneumonia given history. Recommend treatment and follow-up to resolution. Electronically Signed   By: Dorise Bullion III M.D   On: 02/25/2017 09:36     CBC  Recent Labs Lab 02/25/17 0847 02/26/17 0415  WBC 13.6* 14.5*  HGB 11.4* 9.7*  HCT 35.7* 29.9*  PLT 322 341  MCV 80.8 79.1  MCH 25.8* 25.7*  MCHC 31.9 32.4  RDW 16.4* 16.8*  LYMPHSABS 0.6*  --   MONOABS 1.1*  --    EOSABS 0.0  --  BASOSABS 0.0  --     Chemistries   Recent Labs Lab 02/22/17 0842 02/25/17 0847 02/25/17 1128 02/25/17 1440 02/26/17 1517  NA 128* 120* 125* 130* 134*  K 5.3 5.8* 5.4* 5.5* 4.9  CL 91* 85* 93* 96* 101  CO2 '27 29 23 24 25  '$ GLUCOSE 581* 752* 614* 470* 145*  BUN 37* 40* 38* 36* 37*  CREATININE 1.37* 1.54* 1.37* 1.46* 1.35*  CALCIUM 9.0 8.9 8.2* 8.6* 8.2*  MG  --   --   --   --  2.0  AST  --  24 20  --   --   ALT  --  19 18  --   --   ALKPHOS  --  75 68  --   --   BILITOT  --  0.4 0.4  --   --    ------------------------------------------------------------------------------------------------------------------ No results for input(s): CHOL, HDL, LDLCALC, TRIG, CHOLHDL, LDLDIRECT in the last 72 hours.  Lab Results  Component Value Date   HGBA1C >14.0 (H) 10/11/2016   ------------------------------------------------------------------------------------------------------------------  Recent Labs  02/26/17 0931  TSH 2.757   ------------------------------------------------------------------------------------------------------------------ No results for input(s): VITAMINB12, FOLATE, FERRITIN, TIBC, IRON, RETICCTPCT in the last 72 hours.  Coagulation profile No results for input(s): INR, PROTIME in the last 168 hours.  No results for input(s): DDIMER in the last 72 hours.  Cardiac Enzymes  Recent Labs Lab 02/25/17 2140 02/26/17 0414 02/26/17 0931  TROPONINI 0.10* 0.23* 0.22*   ------------------------------------------------------------------------------------------------------------------    Component Value Date/Time   BNP 142.0 (H) 09/24/2014 1350     Edward [name] M.D on 02/26/2017 at 4:06 PM  Between 7am to 7pm - Pager - [phone]  After 7pm go to [email or web address] - password [email or web address]  [facility] -  Office  [phone]   Voice Recognition Viviann Spare dictation system was used to create this note, attempts have been made to correct  errors. Please contact the author with questions and/or clarifications.

## 2017-02-27 ENCOUNTER — Inpatient Hospital Stay (HOSPITAL_COMMUNITY): Payer: Medicare Other

## 2017-02-27 DIAGNOSIS — I4891 Unspecified atrial fibrillation: Secondary | ICD-10-CM

## 2017-02-27 DIAGNOSIS — R778 Other specified abnormalities of plasma proteins: Secondary | ICD-10-CM

## 2017-02-27 DIAGNOSIS — R7989 Other specified abnormal findings of blood chemistry: Secondary | ICD-10-CM

## 2017-02-27 DIAGNOSIS — R748 Abnormal levels of other serum enzymes: Secondary | ICD-10-CM

## 2017-02-27 DIAGNOSIS — I5032 Chronic diastolic (congestive) heart failure: Secondary | ICD-10-CM

## 2017-02-27 LAB — CBC
HEMATOCRIT: 31.7 % — AB (ref 39.0–52.0)
Hemoglobin: 10 g/dL — ABNORMAL LOW (ref 13.0–17.0)
MCH: 25.3 pg — AB (ref 26.0–34.0)
MCHC: 31.5 g/dL (ref 30.0–36.0)
MCV: 80.3 fL (ref 78.0–100.0)
PLATELETS: 375 10*3/uL (ref 150–400)
RBC: 3.95 MIL/uL — AB (ref 4.22–5.81)
RDW: 17.3 % — ABNORMAL HIGH (ref 11.5–15.5)
WBC: 16.7 10*3/uL — ABNORMAL HIGH (ref 4.0–10.5)

## 2017-02-27 LAB — BASIC METABOLIC PANEL
Anion gap: 9 (ref 5–15)
BUN: 35 mg/dL — AB (ref 6–20)
CO2: 23 mmol/L (ref 22–32)
Calcium: 8 mg/dL — ABNORMAL LOW (ref 8.9–10.3)
Chloride: 102 mmol/L (ref 101–111)
Creatinine, Ser: 1.21 mg/dL (ref 0.61–1.24)
GFR calc Af Amer: >60 mL/min (ref >60)
GFR, EST NON AFRICAN AMERICAN: 55 mL/min — AB (ref >60)
GLUCOSE: 127 mg/dL — AB (ref 65–99)
POTASSIUM: 4.4 mmol/L (ref 3.5–5.1)
Sodium: 134 mmol/L — ABNORMAL LOW (ref 135–145)

## 2017-02-27 LAB — GLUCOSE, CAPILLARY
GLUCOSE-CAPILLARY: 204 mg/dL — AB (ref 65–99)
Glucose-Capillary: 128 mg/dL — ABNORMAL HIGH (ref 65–99)
Glucose-Capillary: 189 mg/dL — ABNORMAL HIGH (ref 65–99)
Glucose-Capillary: 209 mg/dL — ABNORMAL HIGH (ref 65–99)

## 2017-02-27 MED ORDER — IPRATROPIUM BROMIDE 0.02 % IN SOLN
0.5000 mg | Freq: Four times a day (QID) | RESPIRATORY_TRACT | Status: DC
  Administered 2017-02-27 – 2017-03-03 (×17): 0.5 mg via RESPIRATORY_TRACT
  Filled 2017-02-27 (×17): qty 2.5

## 2017-02-27 MED ORDER — DILTIAZEM HCL-DEXTROSE 100-5 MG/100ML-% IV SOLN (PREMIX)
5.0000 mg/h | INTRAVENOUS | Status: DC
  Administered 2017-02-27: 5 mg/h via INTRAVENOUS
  Administered 2017-02-27: 15 mg/h via INTRAVENOUS
  Filled 2017-02-27 (×2): qty 100

## 2017-02-27 MED ORDER — DILTIAZEM HCL-DEXTROSE 100-5 MG/100ML-% IV SOLN (PREMIX): 5.0000 mg/h | INTRAVENOUS | Status: DC

## 2017-02-27 MED ORDER — DILTIAZEM LOAD VIA INFUSION
15.0000 mg | Freq: Once | INTRAVENOUS | Status: AC
  Administered 2017-02-27: 15 mg via INTRAVENOUS
  Filled 2017-02-27: qty 15

## 2017-02-27 MED ORDER — INSULIN ASPART PROT & ASPART (70-30 MIX) 100 UNIT/ML ~~LOC~~ SUSP
SUBCUTANEOUS | Status: AC
  Filled 2017-02-27: qty 10

## 2017-02-27 MED ORDER — ASPIRIN EC 81 MG PO TBEC
81.0000 mg | DELAYED_RELEASE_TABLET | Freq: Every day | ORAL | Status: DC
  Administered 2017-02-27: 81 mg via ORAL
  Filled 2017-02-27: qty 1

## 2017-02-27 MED ORDER — SODIUM CHLORIDE 0.9 % IV SOLN
INTRAVENOUS | Status: DC
  Administered 2017-02-27: 12:00:00 via INTRAVENOUS

## 2017-02-27 MED ORDER — PREDNISOLONE 5 MG PO TABS: 40.0000 mg | ORAL_TABLET | Freq: Every day | ORAL | Status: DC

## 2017-02-27 MED ORDER — DEXTROSE 5 % IV SOLN
500.0000 mg | INTRAVENOUS | Status: DC
  Administered 2017-02-27 – 2017-03-02 (×4): 500 mg via INTRAVENOUS
  Filled 2017-02-27 (×6): qty 500

## 2017-02-27 MED ORDER — INSULIN ASPART PROT & ASPART (70-30 MIX) 100 UNIT/ML ~~LOC~~ SUSP
10.0000 [IU] | Freq: Two times a day (BID) | SUBCUTANEOUS | Status: DC
  Administered 2017-02-27 – 2017-02-28 (×3): 10 [IU] via SUBCUTANEOUS
  Filled 2017-02-27: qty 10

## 2017-02-27 MED ORDER — PREDNISONE 20 MG PO TABS
40.0000 mg | ORAL_TABLET | Freq: Every day | ORAL | Status: AC
  Administered 2017-02-27 – 2017-03-03 (×5): 40 mg via ORAL
  Filled 2017-02-27 (×5): qty 2

## 2017-02-27 MED ORDER — SODIUM CHLORIDE 0.9 % IV SOLN: INTRAVENOUS | Status: AC

## 2017-02-27 NOTE — Consult Note (Signed)
   [facility] CM Inpatient Consult   02/27/2017  Edward [name] 1934/12/20 [id number]   Patient screened for [place] Management program services. Patient eligible, however transition of care will be conducted by Primary Care Provider and referral will be sent from MD office if needs persist. Went patient's bedside and introduced [place] Management services. Patient given a [facility] pamphlet and made aware MD office would refer him post discharge.  Needs identified this admit are patient expressed financial difficulty affording insulins. Notification sent to Provider office contact personnel [place] [facility] by leaving a voice mail requesting a call back.  Made inpatient RNCM aware of the above. Please contact for further questions:  Helton Oleson RN, [facility] Liaison  [phone]) [place] [phone]) Toll free office

## 2017-02-27 NOTE — Consult Note (Addendum)
Primary cardiologist: Dr Peter Martinique Consulting cardiologist: Dr Carlyle Dolly Indication: afib, elevated troponin Requesting physician: Dr Denton Brick  Clinical Summary Mr. Parrillo is a 82 y.o.male history of severe COPD, DM2, HTN, HL, chronic diastolic HF, PAF admitted with sepsis and aspiration pneumonia. Cardiology is consulted for transient afib, chronic diastolic HF, and elevated troponin. Patient denies any chest pain or palpitations.    Admit labs: Na 120, K 5.8, Gluc 752, Cr 1.54, WBC 13.6, Plt 322, Hgb 11.4, lactic acid 2.43, TSH 2.7,  Trop neg-->0.10-->0.23-->0.22--> CXR right sided infiltrate 01/2017 echo: LVEF 60-65%,, grade II diastolic dysfunction  12/90 nuclear stress:  Impression Exercise Capacity:  Lexiscan with no exercise. BP Response:  Normal blood pressure response. Clinical Symptoms:  No chest pain or dyspnea. ECG Impression:  No significant ST segment change suggestive of ischemia. Comparison with Prior Nuclear Study: No previous nuclear study performed Overall Impression:  Low risk stress nuclear study with a moderate size, moderate intensity, partially reversible inferior defect consistent with prior inferior infarct and very mild peri-infarct ischemia. LV Ejection Fraction: 69%.  LV Wall Motion:  NL LV Function; NL Wall Motion      Allergies  Allergen Reactions  . Ace Inhibitors Other (See Comments)    Hyperkalemia--07/23/2013:patient states not familiar with the following allergy    Medications Scheduled Medications: . Chlorhexidine Gluconate Cloth  6 each Topical Q0600  . diltiazem  30 mg Oral TID  . guaiFENesin  600 mg Oral BID  . heparin  5,000 Units Subcutaneous Q8H  . insulin aspart  0-20 Units Subcutaneous TID WC  . insulin aspart  0-5 Units Subcutaneous QHS  . insulin aspart  4 Units Subcutaneous TID WC  . insulin glargine  15 Units Subcutaneous QHS  . levalbuterol  0.63 mg Nebulization Q6H  . mupirocin ointment  1 application  Nasal BID  . polyethylene glycol  17 g Oral Daily  . senna  1 tablet Oral BID  . sodium chloride flush  3 mL Intravenous Q12H     Infusions: . sodium chloride 100 mL/hr at 02/27/17 0019  . sodium chloride    . piperacillin-tazobactam (ZOSYN)  IV Stopped (02/27/17 0813)     PRN Medications:  sodium chloride, acetaminophen **OR** acetaminophen, guaiFENesin, levalbuterol, ondansetron **OR** ondansetron (ZOFRAN) IV, polyethylene glycol, sodium chloride flush, traZODone   Past Medical History:  Diagnosis Date  . Allergy    Rhinitis  . Bronchitis   . Chronic respiratory failure ([place])   . Colon polyps   . COPD (chronic obstructive pulmonary disease) ([place])   . Diabetes mellitus   . Elevated lipids   . Hypercholesterolemia   . Hypertension   . On home O2    2L N/C   . PSA elevation   . Pulmonary fibrosis ([place])   . Vitamin D deficiency     Past Surgical History:  Procedure Laterality Date  . CATARACT EXTRACTION W/PHACO  06/25/2012   Procedure: CATARACT EXTRACTION PHACO AND INTRAOCULAR LENS PLACEMENT (IOC);  Surgeon: Elta Guadeloupe T. Gershon Crane, MD;  Location: [facility];  Service: Ophthalmology;  Laterality: Left;  CDE=19.01  . CATARACT EXTRACTION W/PHACO  07/09/2012   Procedure: CATARACT EXTRACTION PHACO AND INTRAOCULAR LENS PLACEMENT (IOC);  Surgeon: Elta Guadeloupe T. Gershon Crane, MD;  Location: [facility];  Service: Ophthalmology;  Laterality: Right;  CDE: 20.09    Family History  Problem Relation Age of Onset  . Heart disease Mother   . CAD Other   . Diabetes Other     Social History Mr.  Pullman reports that he quit smoking about 4 years ago. His smoking use included Cigarettes. He has a 90.00 pack-year smoking history. He has never used smokeless tobacco. Mr. Braxton reports that he does not drink alcohol.  Review of Systems CONSTITUTIONAL: No weight loss, fever, chills, weakness or fatigue.  HEENT: Eyes: No visual loss, blurred vision, double vision or yellow sclerae. No hearing loss, sneezing,  congestion, runny nose or sore throat.  SKIN: No rash or itching.  CARDIOVASCULAR: No chest pain, chest pressure or chest discomfort. No palpitations or edema.  RESPIRATORY: No shortness of breath, cough or sputum.  GASTROINTESTINAL: No anorexia, nausea, vomiting or diarrhea. No abdominal pain or blood.  GENITOURINARY: no polyuria, no dysuria NEUROLOGICAL: No headache, dizziness, syncope, paralysis, ataxia, numbness or tingling in the extremities. No change in bowel or bladder control.  MUSCULOSKELETAL: No muscle, back pain, joint pain or stiffness.  HEMATOLOGIC: No anemia, bleeding or bruising.  LYMPHATICS: No enlarged nodes. No history of splenectomy.  PSYCHIATRIC: No history of depression or anxiety.      Physical Examination Blood pressure 128/67, pulse 100, temperature 98.1 F (36.7 C), temperature source Oral, resp. rate (!) 24, height 5\' 6"  (1.676 m), weight 169 lb 12.1 oz (77 kg), SpO2 95 %.  Intake/Output Summary (Last 24 hours) at 02/27/17 0909 Last data filed at 02/27/17 0413  Gross per 24 hour  Intake             2120 ml  Output              200 ml  Net             1920 ml    HEENT: sclera clear, throat clear  Cardiovascular: tachycardic, no m/r/g, no jvd  Respiratory: severe bilateral wwheezing.   GI: abdomen soft, NT, ND  MSK: no LE edema  Neuro: no focal deficits  Psych: appropriate affect   Lab Results  Basic Metabolic Panel:  Recent Labs Lab 02/25/17 0847 02/25/17 1128 02/25/17 1440 02/26/17 1517 02/27/17 0418  NA 120* 125* 130* 134* 134*  K 5.8* 5.4* 5.5* 4.9 4.4  CL 85* 93* 96* 101 102  CO2 29 23 24 25 23   GLUCOSE 752* 614* 470* 145* 127*  BUN 40* 38* 36* 37* 35*  CREATININE 1.54* 1.37* 1.46* 1.35* 1.21  CALCIUM 8.9 8.2* 8.6* 8.2* 8.0*  MG  --   --   --  2.0  --     Liver Function Tests:  Recent Labs Lab 02/25/17 0847 02/25/17 1128  AST 24 20  ALT 19 18  ALKPHOS 75 68  BILITOT 0.4 0.4  PROT 6.9 6.1*  ALBUMIN 3.3* 2.8*     CBC:  Recent Labs Lab 02/25/17 0847 02/26/17 0415 02/27/17 0418  WBC 13.6* 14.5* 16.7*  NEUTROABS 11.8*  --   --   HGB 11.4* 9.7* 10.0*  HCT 35.7* 29.9* 31.7*  MCV 80.8 79.1 80.3  PLT 322 341 375    Cardiac Enzymes:  Recent Labs Lab 02/25/17 0847 02/25/17 2140 02/26/17 0414 02/26/17 0931  TROPONINI <0.03 0.10* 0.23* 0.22*    BNP: Invalid input(s): POCBNP     Impression/Recommendations  1. Elevated troponin - mild elevation in setting of sepsis and tachycardia, trending down. Echo with normal LVEF - prior stress test with inferior infarct with very mild ischemia - suspect demand ischemia in setting of probable chronic occlusive CAD, as opposed to acute occlusive disease/ACS - no plans for ischemic testing at this time - would start asa  81 mg daily.   2. Chronic diastolic HF - echo with normal LVEF, grade II diastolic dysfunction - CXR with primarily pneumonia - appears euvolemic on exam, would not diurese at this time.   3. Afib - from notes isolated episode, back in SR with IV dilt. Not captured with 12 lead EKG. No prior documented history of afib  - tele shows sinsu tach, PACs, runs of atach. Isolated short episode of afib - afib in setting of systemic illness with pneumonia and COPD exacerbation and multiple electrolyte abnormalities - at this time unclear if isolated event in this setting. Would not commit to anticoag at this time. Continue telemetry, likely consider home monitor once respiratory issues have resolved. Continue oral dilt at current dose, likely consolidate to long acting dilt soon.    Carlyle Dolly, M.D

## 2017-02-27 NOTE — Progress Notes (Signed)
Inpatient Diabetes Program Recommendations  AACE/ADA: New Consensus Statement on Inpatient Glycemic Control (2015)  Target Ranges:  Prepandial:   less than 140 mg/dL      Peak postprandial:   less than 180 mg/dL (1-2 hours)      Critically ill patients:  140 - 180 mg/dL  Results for Edward [name], Edward [name] (MRN [id number]) as of 02/27/2017 10:55  Ref. Range 02/26/2017 07:47 02/26/2017 11:47 02/26/2017 16:55 02/26/2017 21:18 02/27/2017 07:30  Glucose-Capillary Latest Ref Range: 65 - 99 mg/dL 138 (H) 136 (H) 119 (H) 104 (H) 128 (H)  Results for Edward [name], Edward [name] (MRN [id number]) as of 02/27/2017 10:55  Ref. Range 02/25/2017 08:47  Glucose Latest Ref Range: 65 - 99 mg/dL [facility])   Results for Edward [name], Edward [name] (MRN [id number]) as of 02/27/2017 10:55  Ref. Range 07/06/2016 08:36 10/11/2016 08:38  Hemoglobin A1C Latest Ref Range: <5.7 % 13.6 (H) >14.0 (H)   Review of Glycemic Control   Diabetes history: DM2 Outpatient Diabetes medications: Levemir 70 units QHS, Metformin 1000 mg BID Current orders for Inpatient glycemic control: Lantus 15 units QHS, Novolog 0-20 units TID with meals, Novolog 0-5 units QHS, Novolog 4 units TID with meals for meal coverage  Inpatient Diabetes Program Recommendations:  Outpatient DM medications:  Patient may need to be transitioned to a more affordable insulin for outpatient DM control. NOVOLIN 70/30 can be purchased at [facility] for $25 per vial. Placed consult for [facility] RN Case Managment for assistnace with medications. Based on current insulin orders, would recommend NOVOLIN 70/30 10 units BID (will provide 14 units for basal and 6 units for meal coverage per day). A1C: Please consider adding on an A1C to blood in lab to evaluate glycemic control over the past 2-3 months.  NOTE: Spoke with patient about diabetes and home regimen for diabetes control. Patient reports that he is followed by PCP for diabetes management and when asked what he was taking for DM control patient reported that  he was not exactly sure because he has not been able to take his insulin in "a while".  Inquired about why he has not been able to take insulin, patient reports that he is not able to afford his insulin.  Patient states that he uses insulin pens but he has used insulin vial/syringe in the past.   Discussed glucose and A1C goals. Discussed importance of checking CBGs and maintaining good CBG control to prevent long-term and short-term complications. Stressed to the patient the importance of improving glycemic control to prevent further complications from uncontrolled diabetes. Discussed possibility of using 70/30 insulin and explained that NOVOLIN 70/30 could be purchased at [facility] for $25 per vial. Patient states that NOVOLIN 70/30 would be more affordable for him. In reviewing chart, noted patient has an appointment on this Thursday (03/01/17) to fill out paperwork to see if he qualifies for medication assistance. Patient is eligable for [facility]; placed consult for [facility] RN CM and encouraged patient to participate with [facility] for outpatient follow up and for medication assistance.  Encouraged patient to check his glucose 3-4 times per day (before meals and at bedtime) and to keep a log book of glucose readings and insulin taken which he will need to take to doctor appointments. Explained how the doctor he follows up with can use the log book to continue to make insulin adjustments if needed. Patient verbalized understanding of information discussed and he states that he has no further questions at this time related to diabetes.  Thanks, Lelan Pons  Felton Clinton, RN, MSN, CDE Diabetes Coordinator Inpatient Diabetes Program [phone] (Team Pager)

## 2017-02-27 NOTE — Progress Notes (Signed)
Noted heart rate of 150-160 per heart monitor,unable to determine  patient denies chest pain. Notified Dr. Denton Brick new  orders to obtain EKG.

## 2017-02-27 NOTE — Progress Notes (Signed)
Notified Dr. Denton Brick EKG with Afib RVR, new orders noted and received.

## 2017-02-27 NOTE — Progress Notes (Signed)
Patient Demographics:    Edward [name], is a 82 y.o. male, DOB - 30-Jan-1935, TOI:[id number]  Admit date - 02/25/2017   Admitting Physician [name], MD  Outpatient Primary MD for the patient is [name]  LOS - 2  Chief Complaint  Patient presents with  . Nausea  . Emesis  . Shortness of Breath        Subjective:    Edward [name] today Has no complaints, feels he is improving, but still with some difficulty breathing.  T max overnight 100. He denies chest pain, side at time of my exam he is on 3 L of O2 saturating 96%.    Assessment  & Plan :    Principal Problem:   Sepsis ([place]) Active Problems:   Diabetes mellitus type 2, uncontrolled ([name])   COPD (chronic obstructive pulmonary disease) ([name])   Hypertension   Emesis, persistent   Constipation   Overflow diarrhea/Constipation   Rt Sided Aspiration pneumonia ([name])   Chronic diastolic heart failure ([name])   Elevated troponin  Brief narrative :-  82 year old Caucasian male with past medical history relevant for COPD/pulmonary fibrosis as well as history of paroxysmal atrial fibrillation, hypertension and diabetes who was admitted on 02/25/2017 with sepsis from pulmonary source presumed secondary to left-sided aspiration pneumonia in the setting of intractable emesis.  On 02/25/2017 post admission patient became very tachycardic was found to be in A. fib with RVR, responded well to IV Cardizem drip which has since been discontinued. Patient remains in sinus rhythm at this time. 02/27/17- patient's went is afebrile RVR again, rates- 140-150s.  Plan:- Sepsis Secondary to pulmonary source- met sepsis criteria on admission, please see H&P, leukocytosis persist- 16 today (WBC 14.5), fever T max- 100 overnight. Lactic acidosis resolved.  - Continue IV Zosyn 02/25/17 >>  for aspiration pneumonia pending cultures. - With temp overnight, and  bilateral multifocal airspace consolidation on chest x-ray, possible CAP, will start Iv azithro for atypical coverage 5/29 >>  - CBC in a.m. - Trend fever curve  Rt sided aspiration pneumonia- in the setting of intractable emesis, please see #1 above IV Zosyn pending cultures. On 3L o2. - bronchodilators, oxygen supplementation and mucolytics as ordered, switched from albuterol to levalbuterol due to tachycardia - IV azithromycin setting added for atypical coverage - Wean off O2 when stable - Repeat chest x-ray today- bilateral multifocal airspace consolidation, ?new cavity, ? Adenopathy. CT chest with contrast recommended. - Pt just recovering from AKI- Cr- 1.5 >> 1.2- Baseline 0.9- 1. So ordered  CT chest W contrast- for 5/30 am - Gentle hydration for now.  Proxysmal A. fib with RVR- rates- 140s- 150s today. EKG shows A. fib with RVR. With stable blood pressure. [place] Vasc- 4. - IV Cardizem bolus 15 mg, continue with infusion - Hold by mouth Cardizem for now - Cardiology consulted, recs appreciated- at this time will hold off on anticoagulation  COPD/pulm fibrosis- significant diffuse wheezing on exam today, with bilateral reduced air entry. Likely provoked by PNA. - Start steroids 40 mg prednisone daily- May worsen leukocytosis, but he's getting a CT to further evaluate PNA. - Add ipratropium to levalbuterol  Uncontrolled DM- CBG 750 on admit, did not meet better for DKA. Noncompliant. Last  hemoglobin A1c was over 14. Home meds- metformin Levemir 70 daily at bedtime. - Hold metformin - Following DM coordinator RECOMMENDATIONS, with history of noncompliance, DC Lantus and start 70/30  At 10 units BID - Continue sliding scale insulin  HFpEF- patient has history of chronic diastolic dysfunction CHF, appears compensated at this time, echo from 02/26/2017 with EF of 60-65% with grade 2 diastolic dysfunction., In coordinated septal motion. - hold Lasix given sepsis and volume  depletion  HTN- BP is stable at this time, hold losartan and Lasix due to AKI until patient is euvolemic  N/V/D- resolving. intractable emesis and diarrhea in the setting of constipation,?overflow diarrhea,  - antiemetics as ordered, lactulose and MiraLAX as ordered. No acute abdomen clinically and radiologically  AKI- creatinine is down to 1.3 and  hyperkalemia has resolved, AKI was secondary to dehydration in the setting of nausea, vomiting and diarrhea and  compounded by Lasix and losartan use.  - Avoid nephrotoxic agents,  - gentle hydration  - hold Lasix and losartan until patient is euvolemic as above #6, and pending Ct with contrast  Elevated Troponin- in the setting of tachycardia and sepsis suspected demand ischemia, echocardiogram from 02/26/2017 EF remains preserved at over 60%- 65%, in coordinated septal motion. Clinically no evidence of ACS at this time. - Ability consulted recs appreciated  Code Status : Full  Disposition Plan  : home   Consults  :  Diabetic educator and case management   DVT Prophylaxis  :  Heparin -  Lab Results  Component Value Date   PLT 375 02/27/2017    Inpatient Medications  Scheduled Meds: . aspirin EC  81 mg Oral Daily  . Chlorhexidine Gluconate Cloth  6 each Topical Q0600  . guaiFENesin  600 mg Oral BID  . heparin  5,000 Units Subcutaneous Q8H  . insulin aspart  0-20 Units Subcutaneous TID WC  . insulin aspart  0-5 Units Subcutaneous QHS  . insulin aspart  4 Units Subcutaneous TID WC  . insulin glargine  15 Units Subcutaneous QHS  . ipratropium  0.5 mg Nebulization Q6H  . levalbuterol  0.63 mg Nebulization Q6H  . mupirocin ointment  1 application Nasal BID  . polyethylene glycol  17 g Oral Daily  . predniSONE  40 mg Oral Q breakfast  . senna  1 tablet Oral BID   Continuous Infusions: . azithromycin    . diltiazem (CARDIZEM) infusion 12.5 mg/hr (02/27/17 1647)  . piperacillin-tazobactam (ZOSYN)  IV Stopped (02/27/17 1621)    PRN Meds:.acetaminophen **OR** acetaminophen, guaiFENesin, levalbuterol, ondansetron **OR** ondansetron (ZOFRAN) IV, polyethylene glycol, traZODone    Anti-infectives    Start     Dose/Rate Route Frequency Ordered Stop   02/27/17 1800  azithromycin (ZITHROMAX) 500 mg in dextrose 5 % 250 mL IVPB     500 mg 250 mL/hr over 60 Minutes Intravenous Every 24 hours 02/27/17 1651     02/25/17 2000  piperacillin-tazobactam (ZOSYN) IVPB 3.375 g     3.375 g 12.5 mL/hr over 240 Minutes Intravenous Every 8 hours 02/25/17 1159     02/25/17 1130  levofloxacin (LEVAQUIN) IVPB 750 mg  Status:  Discontinued     750 mg 100 mL/hr over 90 Minutes Intravenous Every 48 hours 02/25/17 1109 02/25/17 1118   02/25/17 1130  piperacillin-tazobactam (ZOSYN) IVPB 3.375 g     3.375 g 100 mL/hr over 30 Minutes Intravenous  Once 02/25/17 1118 02/25/17 1216        Objective:   Vitals:  02/27/17 1600 02/27/17 1615 02/27/17 1630 02/27/17 1645  BP: (!) 143/88 127/79 139/77   Pulse: (!) 145 (!) 131 (!) 144 (!) 106  Resp: (!) 29 (!) 22 (!) 24 19  Temp:      TempSrc:      SpO2: 93% 92% 91% 93%  Weight:      Height:        Wt Readings from Last 3 Encounters:  02/26/17 77 kg (169 lb 12.1 oz)  02/22/17 73.9 kg (163 lb)  02/12/17 79.4 kg (175 lb)     Intake/Output Summary (Last 24 hours) at 02/27/17 1653 Last data filed at 02/27/17 1400  Gross per 24 hour  Intake          2339.17 ml  Output                0 ml  Net          2339.17 ml     Physical Exam  Gen:- Awake Alert,  In no apparent distress ,  3L O2. HEENT:- [place].AT, No sclera icterus Neck-Supple Neck,No JVD,.  Lungs-  Reduced air entry diffusely, with inspiratory and expiratory wheezing. CV- S1, S2 normal, initially in sinus rhythm at the time of my eval, later went into A. fib with RVR Abd-  +ve B.Sounds, Abd Soft, No tenderness,    Extremity/Skin:- No  edema,       Data Review:   Micro Results Recent Results (from the past 240 hour(s))   Blood Culture (routine x 2)     Status: None (Preliminary result)   Collection Time: 02/25/17  8:58 AM  Result Value Ref Range Status   Specimen Description BLOOD LEFT HAND  Final   Special Requests   Final    BOTTLES DRAWN AEROBIC AND ANAEROBIC Blood Culture adequate volume   Culture NO GROWTH 2 DAYS  Final   Report Status PENDING  Incomplete  Blood Culture (routine x 2)     Status: None (Preliminary result)   Collection Time: 02/25/17  9:05 AM  Result Value Ref Range Status   Specimen Description LEFT ANTECUBITAL  Final   Special Requests   Final    BOTTLES DRAWN AEROBIC AND ANAEROBIC Blood Culture adequate volume   Culture NO GROWTH 2 DAYS  Final   Report Status PENDING  Incomplete  MRSA PCR Screening     Status: Abnormal   Collection Time: 02/25/17  8:22 PM  Result Value Ref Range Status   MRSA by PCR POSITIVE (A) NEGATIVE Final    Comment:        The GeneXpert MRSA Assay (FDA approved for NASAL specimens only), is one component of a comprehensive MRSA colonization surveillance program. It is not intended to diagnose MRSA infection nor to guide or monitor treatment for MRSA infections. RESULT CALLED TO, READ BACK BY AND VERIFIED WITH: KOGER,L @ 622 BY MATTHEWS,B 02/26/17     Radiology Reports Dg Chest 2 View  Result Date: 02/27/2017 CLINICAL DATA:  Followup pneumonia.  Shortness of breath. EXAM: CHEST  2 VIEW COMPARISON:  02/25/2017 FINDINGS: Normal heart size. Small pleural effusions are identified. Bilateral multifocal airspace consolidation is identified involving the right upper lobe, right lower lobe and left lower lobe. There maybe central cavitation within the left lower lobe opacification. There is mild diffuse edema noted. Prominence of the hilar structures are identified bilaterally. Cannot rule out adenopathy. IMPRESSION: 1. Bilateral multifocal airspace consolidation is again noted which in the acute setting is compatible with pneumonia. There  may be new  cavitation associated with left lower lobe airspace consolidation. Consider further evaluation with contrast enhanced CT of the chest. 2. Cannot rule out bilateral hilar adenopathy. Attention on CT is advised. Electronically Signed   By: Kerby Moors M.D.   On: 02/27/2017 10:48   Dg Abdomen 1 View  Result Date: 02/25/2017 CLINICAL DATA:  Abdominal pain. EXAM: ABDOMEN - 1 VIEW COMPARISON:  March 16, 2016 FINDINGS: Moderate fecal loading seen throughout the colon. No bowel obstruction is identified on today's study. No free air, portal venous gas, or pneumatosis. IMPRESSION: Moderate fecal loading in the colon.  No other abnormalities. Electronically Signed   By: Dorise Bullion III M.D   On: 02/25/2017 09:40   Dg Chest Port 1 View  Result Date: 02/25/2017 CLINICAL DATA:  Shortness of breath. EXAM: PORTABLE CHEST 1 VIEW COMPARISON:  July 24, 2016 FINDINGS: Infiltrates are seen in the right mid and medial right lower lung probably involving both the upper and lower lobes. No infiltrate seen on the left. The cardiomediastinal silhouette is normal. No pneumothorax. No other acute abnormalities. IMPRESSION: Multifocal right sided infiltrates suggesting pneumonia given history. Recommend treatment and follow-up to resolution. Electronically Signed   By: Dorise Bullion III M.D   On: 02/25/2017 09:36     CBC  Recent Labs Lab 02/25/17 0847 02/26/17 0415 02/27/17 0418  WBC 13.6* 14.5* 16.7*  HGB 11.4* 9.7* 10.0*  HCT 35.7* 29.9* 31.7*  PLT 322 341 375  MCV 80.8 79.1 80.3  MCH 25.8* 25.7* 25.3*  MCHC 31.9 32.4 31.5  RDW 16.4* 16.8* 17.3*  LYMPHSABS 0.6*  --   --   MONOABS 1.1*  --   --   EOSABS 0.0  --   --   BASOSABS 0.0  --   --     Chemistries   Recent Labs Lab 02/25/17 0847 02/25/17 1128 02/25/17 1440 02/26/17 1517 02/27/17 0418  NA 120* 125* 130* 134* 134*  K 5.8* 5.4* 5.5* 4.9 4.4  CL 85* 93* 96* 101 102  CO2 '29 23 24 25 23  '$ GLUCOSE 752* 614* 470* 145* 127*  BUN 40* 38*  36* 37* 35*  CREATININE 1.54* 1.37* 1.46* 1.35* 1.21  CALCIUM 8.9 8.2* 8.6* 8.2* 8.0*  MG  --   --   --  2.0  --   AST 24 20  --   --   --   ALT 19 18  --   --   --   ALKPHOS 75 68  --   --   --   BILITOT 0.4 0.4  --   --   --    ------------------------------------------------------------------------------------------------------------------ No results for input(s): CHOL, HDL, LDLCALC, TRIG, CHOLHDL, LDLDIRECT in the last 72 hours.  Lab Results  Component Value Date   HGBA1C >14.0 (H) 10/11/2016   ------------------------------------------------------------------------------------------------------------------  Recent Labs  02/26/17 0931  TSH 2.757   ------------------------------------------------------------------------------------------------------------------ Cardiac Enzymes  Recent Labs Lab 02/25/17 2140 02/26/17 0414 02/26/17 0931  TROPONINI 0.10* 0.23* 0.22*   ------------------------------------------------------------------------------------------------------------------    Component Value Date/Time   BNP 142.0 (H) 09/24/2014 1350    Laurieanne Galloway E Joshaua Epple M.D on 02/27/2017 at 4:53 PM  Between 7am to 7pm - Pager - [phone]  After 7pm go to [email or web address] - password [facility]  [facility] -  Office  [phone]

## 2017-02-28 ENCOUNTER — Inpatient Hospital Stay (HOSPITAL_COMMUNITY): Payer: Medicare Other

## 2017-02-28 DIAGNOSIS — R197 Diarrhea, unspecified: Secondary | ICD-10-CM

## 2017-02-28 DIAGNOSIS — Z794 Long term (current) use of insulin: Secondary | ICD-10-CM

## 2017-02-28 DIAGNOSIS — E1165 Type 2 diabetes mellitus with hyperglycemia: Secondary | ICD-10-CM

## 2017-02-28 DIAGNOSIS — E1121 Type 2 diabetes mellitus with diabetic nephropathy: Secondary | ICD-10-CM

## 2017-02-28 DIAGNOSIS — R0602 Shortness of breath: Secondary | ICD-10-CM

## 2017-02-28 DIAGNOSIS — R739 Hyperglycemia, unspecified: Secondary | ICD-10-CM

## 2017-02-28 DIAGNOSIS — J439 Emphysema, unspecified: Secondary | ICD-10-CM

## 2017-02-28 LAB — BASIC METABOLIC PANEL
Anion gap: 10 (ref 5–15)
BUN: 38 mg/dL — AB (ref 6–20)
CO2: 22 mmol/L (ref 22–32)
CREATININE: 1.23 mg/dL (ref 0.61–1.24)
Calcium: 8 mg/dL — ABNORMAL LOW (ref 8.9–10.3)
Chloride: 101 mmol/L (ref 101–111)
GFR calc Af Amer: >60 mL/min (ref >60)
GFR, EST NON AFRICAN AMERICAN: 54 mL/min — AB (ref >60)
GLUCOSE: 203 mg/dL — AB (ref 65–99)
Potassium: 4.4 mmol/L (ref 3.5–5.1)
SODIUM: 133 mmol/L — AB (ref 135–145)

## 2017-02-28 LAB — CBC
HCT: 29.2 % — ABNORMAL LOW (ref 39.0–52.0)
Hemoglobin: 9.3 g/dL — ABNORMAL LOW (ref 13.0–17.0)
MCH: 25.3 pg — ABNORMAL LOW (ref 26.0–34.0)
MCHC: 31.8 g/dL (ref 30.0–36.0)
MCV: 79.6 fL (ref 78.0–100.0)
PLATELETS: 342 10*3/uL (ref 150–400)
RBC: 3.67 MIL/uL — ABNORMAL LOW (ref 4.22–5.81)
RDW: 17.6 % — AB (ref 11.5–15.5)
WBC: 13.9 10*3/uL — AB (ref 4.0–10.5)

## 2017-02-28 LAB — GLUCOSE, CAPILLARY
GLUCOSE-CAPILLARY: 247 mg/dL — AB (ref 65–99)
Glucose-Capillary: 195 mg/dL — ABNORMAL HIGH (ref 65–99)
Glucose-Capillary: 366 mg/dL — ABNORMAL HIGH (ref 65–99)
Glucose-Capillary: 309 mg/dL — ABNORMAL HIGH (ref 65–99)

## 2017-02-28 MED ORDER — APIXABAN 5 MG PO TABS
5.0000 mg | ORAL_TABLET | Freq: Two times a day (BID) | ORAL | Status: DC
  Administered 2017-02-28 – 2017-03-03 (×7): 5 mg via ORAL
  Filled 2017-02-28 (×7): qty 1

## 2017-02-28 MED ORDER — DILTIAZEM HCL 30 MG PO TABS
30.0000 mg | ORAL_TABLET | Freq: Three times a day (TID) | ORAL | Status: DC
  Administered 2017-02-28 – 2017-03-01 (×4): 30 mg via ORAL
  Filled 2017-02-28 (×4): qty 1

## 2017-02-28 MED ORDER — IOPAMIDOL (ISOVUE-300) INJECTION 61%
75.0000 mL | Freq: Once | INTRAVENOUS | Status: AC | PRN
  Administered 2017-02-28: 75 mL via INTRAVENOUS

## 2017-02-28 NOTE — Progress Notes (Addendum)
PROGRESS NOTE    Edward [name]  TGG:[id number] DOB: 09-09-1935 DOA: 02/25/2017 PCP: [name]    Brief Narrative:  82 year old Caucasian male with past medical history relevant for COPD/pulmonary fibrosis as well as history of paroxysmal atrial fibrillation, hypertension and diabetes who was admitted on 02/25/2017 with sepsis from pulmonary source presumed secondary to left-sided aspiration pneumonia in the setting of intractable emesis.  On 02/25/2017 post admission patient became very tachycardic was found to be in A. fib with RVR, responded well to IV Cardizem drip which has since been discontinued. Patient remains in sinus rhythm at this time. 02/27/17- patient's went is afebrile RVR again, rates- 140-150s.   Assessment & Plan:   Principal Problem:   Sepsis ([place]) Active Problems:   Diabetes mellitus type 2, uncontrolled ([name])   COPD (chronic obstructive pulmonary disease) ([name])   Hypertension   Emesis, persistent   Constipation   Overflow diarrhea/Constipation   Rt Sided Aspiration pneumonia ([name])   Chronic diastolic heart failure ([name])   Elevated troponin   Sepsis Secondary to pulmonary source - leukocytosis, tachycardia, tachypnea, and lactic acid is elevated - given IV fluid boluses per sepsis protocol - continue IV Zosyn for presumed aspiration pneumonia - blood cultures showing NG x 2 days  Rt sided aspiration pneumonia - in the setting of intractable emesis - continue IV Zosyn, bronchodilators, oxygen supplementation and mucolytics as ordered  Atrial fibrillation - cardiology consulted - transitioning off IV diltiazem to PO - starting anticoagulation 2/2 recurrent paroxysmal atrial fib  Uncontrolled DM - on admission blood glucose is over 750 - patient admits to noncompliance - last hemoglobin A1c was over 14 - repeat HgA1c pending - Novolog 70/30 of 10 units BID, Novology 4 units TID, SSI - diabetes educator consulted  HFpEF - patient has  history of chronic diastolic dysfunction CHF -appears euvolemic  COPD/Pulm Fibrosis - no wheezing noted on exam - bronchodilators and mucolytics as ordered - continue supplemental oxygen.   HTN - BP is stable at this time - continue to hold losartan and Lasix due to AKI  N/V/D - intractable emesis and diarrhea in the setting of constipation - no reports of diarrhea or emesis per patient today  AKI - Cr improved to 1.23 today - continue to hold lasix - will repeat BMP in am as patient getting contrast today for CT  DVT prophylaxis: SCD/ subq heparin Code Status: Full code Family Communication:  Disposition Plan: pending improvement in heart rate and further evaluation with chest CT; will likely discharge back to previous home environment   Consultants:   Cardiology  Diabetes education  CM  SLP  Procedures:   None  Antimicrobials:   Zosyn  Azithromycin    Subjective: Patient eating breakfast.  States he feels slightly better than yesterday.  Still having significant cough which he states he has had for years.  Denies any vomiting or diarrhea.  Denies chest pain or palpitations, denies shortness of breath.  Asking if he can go home.  Objective: Vitals:   02/28/17 0530 02/28/17 0600 02/28/17 0729 02/28/17 0839  BP: 134/60 139/72    Pulse: 87 (!) 102 87   Resp: (!) 27 (!) 23 (!) 25   Temp:   97.7 F (36.5 C)   TempSrc:   Oral   SpO2: 94% 91% 97% 97%  Weight:      Height:        Intake/Output Summary (Last 24 hours) at 02/28/17 0845 Last data filed at 02/28/17  0600  Gross per 24 hour  Intake          1508.41 ml  Output              575 ml  Net           933.41 ml   Filed Weights   02/25/17 1119 02/26/17 0400 02/28/17 0500  Weight: 72.6 kg (160 lb) 77 kg (169 lb 12.1 oz) 82 kg (180 lb 12.4 oz)    Examination:  General exam: Appears calm and comfortable  Respiratory system: rhonchi in lower and middle lung fields bilaterally, poor air  movement, no wheezing appreciated, no accessory muscle use Cardiovascular system: S1 & S2 heard, RRR. No JVD, murmurs, rubs, gallops or clicks. No pedal edema. Gastrointestinal system: Abdomen is nondistended, soft and nontender. No organomegaly or masses felt. Normal bowel sounds heard. Central nervous system: No focal neurological deficits. Alert Extremities: Symmetric 5 x 5 power. Psychiatry: Unclear to patient's insight into his chronic medical conditions, Mood & affect appropriate.     Data Reviewed: I have personally reviewed following labs and imaging studies  CBC:  Recent Labs Lab 02/25/17 0847 02/26/17 0415 02/27/17 0418 02/28/17 0446  WBC 13.6* 14.5* 16.7* 13.9*  NEUTROABS 11.8*  --   --   --   HGB 11.4* 9.7* 10.0* 9.3*  HCT 35.7* 29.9* 31.7* 29.2*  MCV 80.8 79.1 80.3 79.6  PLT 322 341 375 481   Basic Metabolic Panel:  Recent Labs Lab 02/25/17 1128 02/25/17 1440 02/26/17 1517 02/27/17 0418 02/28/17 0446  NA 125* 130* 134* 134* 133*  K 5.4* 5.5* 4.9 4.4 4.4  CL 93* 96* 101 102 101  CO2 23 24 25 23 22   GLUCOSE 614* 470* 145* 127* 203*  BUN 38* 36* 37* 35* 38*  CREATININE 1.37* 1.46* 1.35* 1.21 1.23  CALCIUM 8.2* 8.6* 8.2* 8.0* 8.0*  MG  --   --  2.0  --   --    GFR: Estimated Creatinine Clearance: 49 mL/min (by C-G formula based on SCr of 1.23 mg/dL). Liver Function Tests:  Recent Labs Lab 02/25/17 0847 02/25/17 1128  AST 24 20  ALT 19 18  ALKPHOS 75 68  BILITOT 0.4 0.4  PROT 6.9 6.1*  ALBUMIN 3.3* 2.8*    Recent Labs Lab 02/25/17 0847  LIPASE 13   No results for input(s): AMMONIA in the last 168 hours. Coagulation Profile: No results for input(s): INR, PROTIME in the last 168 hours. Cardiac Enzymes:  Recent Labs Lab 02/25/17 0847 02/25/17 2140 02/26/17 0414 02/26/17 0931  TROPONINI <0.03 0.10* 0.23* 0.22*   BNP (last 3 results) No results for input(s): PROBNP in the last 8760 hours. HbA1C: No results for input(s): HGBA1C in  the last 72 hours. CBG:  Recent Labs Lab 02/27/17 0730 02/27/17 1152 02/27/17 1709 02/27/17 2135 02/28/17 0727  GLUCAP 128* 189* 204* 209* 195*   Lipid Profile: No results for input(s): CHOL, HDL, LDLCALC, TRIG, CHOLHDL, LDLDIRECT in the last 72 hours. Thyroid Function Tests:  Recent Labs  02/26/17 0931  TSH 2.757   Anemia Panel: No results for input(s): VITAMINB12, FOLATE, FERRITIN, TIBC, IRON, RETICCTPCT in the last 72 hours. Sepsis Labs:  Recent Labs Lab 02/25/17 0857 02/25/17 2140 02/26/17 0039  LATICACIDVEN 2.43* 2.0* 1.3    Recent Results (from the past 240 hour(s))  Blood Culture (routine x 2)     Status: None (Preliminary result)   Collection Time: 02/25/17  8:58 AM  Result Value Ref Range Status  Specimen Description BLOOD LEFT HAND  Final   Special Requests   Final    BOTTLES DRAWN AEROBIC AND ANAEROBIC Blood Culture adequate volume   Culture NO GROWTH 2 DAYS  Final   Report Status PENDING  Incomplete  Blood Culture (routine x 2)     Status: None (Preliminary result)   Collection Time: 02/25/17  9:05 AM  Result Value Ref Range Status   Specimen Description LEFT ANTECUBITAL  Final   Special Requests   Final    BOTTLES DRAWN AEROBIC AND ANAEROBIC Blood Culture adequate volume   Culture NO GROWTH 2 DAYS  Final   Report Status PENDING  Incomplete  MRSA PCR Screening     Status: Abnormal   Collection Time: 02/25/17  8:22 PM  Result Value Ref Range Status   MRSA by PCR POSITIVE (A) NEGATIVE Final    Comment:        The GeneXpert MRSA Assay (FDA approved for NASAL specimens only), is one component of a comprehensive MRSA colonization surveillance program. It is not intended to diagnose MRSA infection nor to guide or monitor treatment for MRSA infections. RESULT CALLED TO, READ BACK BY AND VERIFIED WITH: KOGER,L @ 335 BY MATTHEWS,B 02/26/17          Radiology Studies: Dg Chest 2 View  Result Date: 02/27/2017 CLINICAL DATA:  Followup  pneumonia.  Shortness of breath. EXAM: CHEST  2 VIEW COMPARISON:  02/25/2017 FINDINGS: Normal heart size. Small pleural effusions are identified. Bilateral multifocal airspace consolidation is identified involving the right upper lobe, right lower lobe and left lower lobe. There maybe central cavitation within the left lower lobe opacification. There is mild diffuse edema noted. Prominence of the hilar structures are identified bilaterally. Cannot rule out adenopathy. IMPRESSION: 1. Bilateral multifocal airspace consolidation is again noted which in the acute setting is compatible with pneumonia. There may be new cavitation associated with left lower lobe airspace consolidation. Consider further evaluation with contrast enhanced CT of the chest. 2. Cannot rule out bilateral hilar adenopathy. Attention on CT is advised. Electronically Signed   By: Kerby Moors M.D.   On: 02/27/2017 10:48        Scheduled Meds: . apixaban  5 mg Oral BID  . Chlorhexidine Gluconate Cloth  6 each Topical Q0600  . diltiazem  30 mg Oral Q8H  . guaiFENesin  600 mg Oral BID  . insulin aspart  0-5 Units Subcutaneous QHS  . insulin aspart  4 Units Subcutaneous TID WC  . insulin aspart protamine- aspart  10 Units Subcutaneous BID WC  . ipratropium  0.5 mg Nebulization Q6H  . levalbuterol  0.63 mg Nebulization Q6H  . mupirocin ointment  1 application Nasal BID  . polyethylene glycol  17 g Oral Daily  . predniSONE  40 mg Oral Q breakfast  . senna  1 tablet Oral BID   Continuous Infusions: . azithromycin Stopped (02/27/17 1907)  . piperacillin-tazobactam (ZOSYN)  IV Stopped (02/28/17 4562)     LOS: 3 days    Time spent: 35 minutes    Loretha Stapler, MD [facility] Pager [phone]  If 7PM-7AM, please contact night-coverage [email or web address] Password [email or web address] 02/28/2017, 8:45 AM

## 2017-02-28 NOTE — Care Management Note (Signed)
Case Management Note  Patient Details  Name: Edward [name] MRN: [id number] Date of Birth: 26-Jun-1935  Subjective/Objective:  Adm from with PNA/COPD/PF. From home with family. Ind with PTA. No HH PTA. Has continuous oxygen with [facility]. Patient reports problems affording his insulin pens recently. Discussed with patient and son about using 70/30 insulin as mentioned by Diabetes Coordinator. Patient agreeable. CM discussed with son having RN teach patient and family how to draw up insulin and administer. Son agrees. Patient has talked with [facility] hospital liaison and is agreeable to receiving their services. Will be referred by patient's MD office.           Action/Plan: Patient plans to return home with self care. Son asked to be present for Insulin teaching and to bring portable oxygen tank tomorrow for DC home.    Expected Discharge Date:     03/01/2017             Expected Discharge Plan:     In-House Referral:     Discharge planning Services  CM Consult  Post Acute Care Choice:    Choice offered to:  Patient  DME Arranged:    DME Agency:     HH Arranged:    [place] Agency:     Status of Service:  In process, will continue to follow  If discussed at Long Length of Stay Meetings, dates discussed:    Additional Comments:  Amaal Dimartino, Chauncey Reading, RN 02/28/2017, 2:18 PM

## 2017-02-28 NOTE — Progress Notes (Signed)
Progress Note  Patient Name: Edward [name] Date of Encounter: 02/28/2017   Subjective   SOB is improving  Inpatient Medications    Scheduled Meds: . aspirin EC  81 mg Oral Daily  . Chlorhexidine Gluconate Cloth  6 each Topical Q0600  . guaiFENesin  600 mg Oral BID  . heparin  5,000 Units Subcutaneous Q8H  . insulin aspart  0-5 Units Subcutaneous QHS  . insulin aspart  4 Units Subcutaneous TID WC  . insulin aspart protamine- aspart  10 Units Subcutaneous BID WC  . ipratropium  0.5 mg Nebulization Q6H  . levalbuterol  0.63 mg Nebulization Q6H  . mupirocin ointment  1 application Nasal BID  . polyethylene glycol  17 g Oral Daily  . predniSONE  40 mg Oral Q breakfast  . senna  1 tablet Oral BID   Continuous Infusions: . azithromycin Stopped (02/27/17 1907)  . diltiazem (CARDIZEM) infusion 2.5 mg/hr (02/28/17 0330)  . piperacillin-tazobactam (ZOSYN)  IV Stopped (02/28/17 0712)   PRN Meds: acetaminophen **OR** acetaminophen, guaiFENesin, levalbuterol, ondansetron **OR** ondansetron (ZOFRAN) IV, polyethylene glycol, traZODone   Vital Signs    Vitals:   02/28/17 0500 02/28/17 0530 02/28/17 0600 02/28/17 0729  BP: (!) 146/76 134/60 139/72   Pulse: 88 87 (!) 102 87  Resp: (!) 25 (!) 27 (!) 23 (!) 25  Temp:    97.7 F (36.5 C)  TempSrc:    Oral  SpO2: 94% 94% 91% 97%  Weight: 180 lb 12.4 oz (82 kg)     Height:        Intake/Output Summary (Last 24 hours) at 02/28/17 0816 Last data filed at 02/28/17 0600  Gross per 24 hour  Intake          1508.41 ml  Output              575 ml  Net           933.41 ml   Filed Weights   02/25/17 1119 02/26/17 0400 02/28/17 0500  Weight: 160 lb (72.6 kg) 169 lb 12.1 oz (77 kg) 180 lb 12.4 oz (82 kg)    Telemetry    currenrlty SR, afib most of yesterday - Personally Reviewed  ECG    n/a  Physical Exam   GEN: No acute distress.   Neck: No JVD Cardiac: RRR, no murmurs, rubs, or gallops.  Respiratory:mild coarse breath  sounds bilateral bases GI: Soft, nontender, non-distended  MS: No edema; No deformity. Neuro:  Nonfocal  Psych: Normal affect   Labs    Chemistry Recent Labs Lab 02/25/17 0847 02/25/17 1128  02/26/17 1517 02/27/17 0418 02/28/17 0446  NA 120* 125*  < > 134* 134* 133*  K 5.8* 5.4*  < > 4.9 4.4 4.4  CL 85* 93*  < > 101 102 101  CO2 29 23  < > 25 23 22   GLUCOSE 752* 614*  < > 145* 127* 203*  BUN 40* 38*  < > 37* 35* 38*  CREATININE 1.54* 1.37*  < > 1.35* 1.21 1.23  CALCIUM 8.9 8.2*  < > 8.2* 8.0* 8.0*  PROT 6.9 6.1*  --   --   --   --   ALBUMIN 3.3* 2.8*  --   --   --   --   AST 24 20  --   --   --   --   ALT 19 18  --   --   --   --   [facility]  75 68  --   --   --   --   BILITOT 0.4 0.4  --   --   --   --   GFRNONAA 41* 47*  < > 48* 55* 54*  GFRAA 48* 55*  < > 56* >60 >60  ANIONGAP 6 9  < > 8 9 10   < > = values in this interval not displayed.   Hematology Recent Labs Lab 02/26/17 0415 02/27/17 0418 02/28/17 0446  WBC 14.5* 16.7* 13.9*  RBC 3.78* 3.95* 3.67*  HGB 9.7* 10.0* 9.3*  HCT 29.9* 31.7* 29.2*  MCV 79.1 80.3 79.6  MCH 25.7* 25.3* 25.3*  MCHC 32.4 31.5 31.8  RDW 16.8* 17.3* 17.6*  PLT 341 375 342    Cardiac Enzymes Recent Labs Lab 02/25/17 0847 02/25/17 2140 02/26/17 0414 02/26/17 0931  TROPONINI <0.03 0.10* 0.23* 0.22*   No results for input(s): TROPIPOC in the last 168 hours.   BNPNo results for input(s): BNP, PROBNP in the last 168 hours.   DDimer No results for input(s): DDIMER in the last 168 hours.   Radiology    Dg Chest 2 View  Result Date: 02/27/2017 CLINICAL DATA:  Followup pneumonia.  Shortness of breath. EXAM: CHEST  2 VIEW COMPARISON:  02/25/2017 FINDINGS: Normal heart size. Small pleural effusions are identified. Bilateral multifocal airspace consolidation is identified involving the right upper lobe, right lower lobe and left lower lobe. There maybe central cavitation within the left lower lobe opacification. There is mild diffuse  edema noted. Prominence of the hilar structures are identified bilaterally. Cannot rule out adenopathy. IMPRESSION: 1. Bilateral multifocal airspace consolidation is again noted which in the acute setting is compatible with pneumonia. There may be new cavitation associated with left lower lobe airspace consolidation. Consider further evaluation with contrast enhanced CT of the chest. 2. Cannot rule out bilateral hilar adenopathy. Attention on CT is advised. Electronically Signed   By: Kerby Moors M.D.   On: 02/27/2017 10:48    Cardiac Studies    Patient Profile     82 y.o. male male history of severe COPD, DM2, HTN, HL, chronic diastolic HF, PAF admitted with sepsis and aspiration pneumonia. Cardiology is consulted for transient afib, chronic diastolic HF, and elevated troponin. Patient denies any chest pain or palpitations.   Assessment & Plan    1. Elevated troponin - mild elevation in setting of sepsis and tachycardia, trending down. Echo with normal LVEF - prior stress test with inferior infarct with very mild ischemia - suspect demand ischemia in setting of probable chronic occlusive CAD, as opposed to acute occlusive disease/ACS - no plans for ischemic testing at this time - would start asa 81 mg daily.   2. Chronic diastolic HF - - echo with normal LVEF, grade II diastolic dysfunction - CXR with primarily pneumonia - appears euvolemic on exam, would not diurese at this time.   3. 3. Afib - afib in setting of systemic illness with pneumonia and COPD exacerbation and multiple electrolyte abnormalities. Initial episode short in duration, did have recurrent episode yesterday.  - at this time unclear if isolated event in this setting.  - restarted on IV dilt yesterday. Will start oral and d/c drip today. Hopefully as infection and respiratory issues improve drive for afib will resolve - with recurrent afib yesterday for most of the day, we will start anticoag this admission.  CHADS2Vasc score is 5.   4. COPD/Pulm fibrosis - management per primary team  5. Pneumonia -  management per primary team   Carlyle Dolly, M.D  Signed, Carlyle Dolly, MD  02/28/2017, 8:16 AM

## 2017-02-28 NOTE — Care Management Important Message (Signed)
Important Message  Patient Details  Name: Edward [name] MRN: [id number] Date of Birth: August 21, 1935   Medicare Important Message Given:  Yes    Adilene Areola, Chauncey Reading, RN 02/28/2017, 2:46 PM

## 2017-02-28 NOTE — Progress Notes (Signed)
Pharmacy Antibiotic Note  Edward [name] is a 82 y.o. male admitted on 02/25/2017 with sepsis / aspiration pna.   Pharmacy has been consulted for zosyn dosing.  Plan: Zosyn 3.375g IV q8h (4 hour infusion).  F/u renal function, cultures and clinical course  Height: 5\' 6"  (167.6 cm) Weight: 180 lb 12.4 oz (82 kg) IBW/kg (Calculated) : 63.8  Temp (24hrs), Avg:98 F (36.7 C), Min:97.3 F (36.3 C), Max:99 F (37.2 C)   Recent Labs Lab 02/25/17 0847 02/25/17 0857 02/25/17 1128 02/25/17 1440 02/25/17 2140 02/26/17 0039 02/26/17 0415 02/26/17 1517 02/27/17 0418 02/28/17 0446  WBC 13.6*  --   --   --   --   --  14.5*  --  16.7* 13.9*  CREATININE 1.54*  --  1.37* 1.46*  --   --   --  1.35* 1.21 1.23  LATICACIDVEN  --  2.43*  --   --  2.0* 1.3  --   --   --   --     Estimated Creatinine Clearance: 49 mL/min (by C-G formula based on SCr of 1.23 mg/dL).    Allergies  Allergen Reactions  . Ace Inhibitors Other (See Comments)    Hyperkalemia--07/23/2013:patient states not familiar with the following allergy   Thank you for allowing pharmacy to be a part of this patient's care.  Hart Robinsons A 02/28/2017 11:09 AM

## 2017-02-28 NOTE — Plan of Care (Signed)
Problem: Cardiac: Goal: Ability to achieve and maintain adequate cardiopulmonary perfusion will improve Outcome: Progressing Pt in nsr  Problem: Activity: Goal: Ability to implement measures to reduce episodes of fatigue will improve Outcome: Progressing Pt up in chair throughout the day. Tolerates  Well. Goal: Ability to tolerate increased activity will improve Outcome: Progressing Walks to toliet w/o diffculty or sob Goal: Will verbalize the importance of balancing activity with adequate rest periods Outcome: Progressing Discussed w/ pt  Problem: Coping: Goal: Ability to identify and develop effective coping behavior will improve Outcome: Progressing Pt expresses comfort in family support  Problem: Nutritional: Goal: Maintenance of adequate nutrition will improve Outcome: Progressing Appetite is improving

## 2017-03-01 ENCOUNTER — Ambulatory Visit: Payer: Medicare Other | Admitting: Physician Assistant

## 2017-03-01 ENCOUNTER — Ambulatory Visit: Payer: Self-pay | Admitting: Pharmacist

## 2017-03-01 DIAGNOSIS — J189 Pneumonia, unspecified organism: Secondary | ICD-10-CM

## 2017-03-01 DIAGNOSIS — R1110 Vomiting, unspecified: Secondary | ICD-10-CM

## 2017-03-01 LAB — GLUCOSE, CAPILLARY
GLUCOSE-CAPILLARY: 162 mg/dL — AB (ref 65–99)
GLUCOSE-CAPILLARY: 214 mg/dL — AB (ref 65–99)
Glucose-Capillary: 152 mg/dL — ABNORMAL HIGH (ref 65–99)
Glucose-Capillary: 152 mg/dL — ABNORMAL HIGH (ref 65–99)

## 2017-03-01 LAB — BASIC METABOLIC PANEL
Anion gap: 8 (ref 5–15)
BUN: 36 mg/dL — AB (ref 6–20)
CHLORIDE: 101 mmol/L (ref 101–111)
CO2: 27 mmol/L (ref 22–32)
Calcium: 8.6 mg/dL — ABNORMAL LOW (ref 8.9–10.3)
Creatinine, Ser: 1.08 mg/dL (ref 0.61–1.24)
GFR calc Af Amer: >60 mL/min (ref >60)
GFR calc non Af Amer: >60 mL/min (ref >60)
Glucose, Bld: 174 mg/dL — ABNORMAL HIGH (ref 65–99)
POTASSIUM: 4.4 mmol/L (ref 3.5–5.1)
Sodium: 136 mmol/L (ref 135–145)

## 2017-03-01 LAB — HEMOGLOBIN A1C
Hgb A1c MFr Bld: 13.3 % — ABNORMAL HIGH (ref 4.8–5.6)
Mean Plasma Glucose: 335 mg/dL

## 2017-03-01 MED ORDER — INSULIN ASPART PROT & ASPART (70-30 MIX) 100 UNIT/ML ~~LOC~~ SUSP
13.0000 [IU] | Freq: Every day | SUBCUTANEOUS | Status: DC
  Administered 2017-03-02: 13 [IU] via SUBCUTANEOUS
  Filled 2017-03-01: qty 10

## 2017-03-01 MED ORDER — DILTIAZEM HCL ER COATED BEADS 180 MG PO CP24
180.0000 mg | ORAL_CAPSULE | Freq: Every day | ORAL | Status: DC
  Administered 2017-03-01: 180 mg via ORAL
  Filled 2017-03-01: qty 1

## 2017-03-01 MED ORDER — INSULIN ASPART PROT & ASPART (70-30 MIX) 100 UNIT/ML ~~LOC~~ SUSP: 18.0000 [IU] | Freq: Every morning | SUBCUTANEOUS | Status: DC

## 2017-03-01 MED ORDER — INSULIN ASPART PROT & ASPART (70-30 MIX) 100 UNIT/ML ~~LOC~~ SUSP
18.0000 [IU] | Freq: Every day | SUBCUTANEOUS | Status: DC
  Administered 2017-03-01 – 2017-03-03 (×3): 18 [IU] via SUBCUTANEOUS
  Filled 2017-03-01: qty 10

## 2017-03-01 MED ORDER — INSULIN ASPART 100 UNIT/ML ~~LOC~~ SOLN
0.0000 [IU] | Freq: Three times a day (TID) | SUBCUTANEOUS | Status: DC
  Administered 2017-03-01: 3 [IU] via SUBCUTANEOUS
  Administered 2017-03-01: 5 [IU] via SUBCUTANEOUS
  Administered 2017-03-02: 3 [IU] via SUBCUTANEOUS
  Administered 2017-03-02: 5 [IU] via SUBCUTANEOUS
  Administered 2017-03-03: 2 [IU] via SUBCUTANEOUS

## 2017-03-01 NOTE — Progress Notes (Signed)
Transferred to 328 with oxygen in stable condition, reported to R.Arthur Holms, Therapist, sports.

## 2017-03-01 NOTE — Consult Note (Signed)
Consult requested by: [facility],Dr. Adair Patter Consult requested for aspiration pneumonia COPD:  HPI: This is a 82 year old who came to the emergency department with intractable nausea and vomiting appeared to be septic on admission and had what looks like aspiration pneumonia by chest x-ray and CT. On chest x-ray the pneumonia was apparent only on the lateral right side but on CT he has bilateral infiltrates scattered pretty much throughout the entire lung. He was given boluses of fluid for sepsis started on IV Zosyn and has improved. He says that he's had trouble with aspirating in the past. He has a history of COPD and there is a history in the chart of pulmonary fibrosis but I'm not sure where that history came from and if he does in fact have pulmonary fibrosis. He is on home oxygen and has a nebulizer at home. He has diabetes and says he's not been compliant with his medication. He has a history of chronic diastolic heart failure. He says he is having some chest pain that he associates with his cough. He coughs pretty much all day and all night. He has no other new complaints. He denies any nausea or vomiting now. He was constipated but has had results from treatment here. History obtained from the medical record and the patient who is a fairly poor historian  Past Medical History:  Diagnosis Date  . Allergy    Rhinitis  . Bronchitis   . Chronic respiratory failure ([place])   . Colon polyps   . COPD (chronic obstructive pulmonary disease) ([place])   . Diabetes mellitus   . Elevated lipids   . Hypercholesterolemia   . Hypertension   . On home O2    2L N/C   . PSA elevation   . Pulmonary fibrosis ([place])   . Vitamin D deficiency      Family History  Problem Relation Age of Onset  . Heart disease Mother   . CAD Other   . Diabetes Other      Social History   Social History  . Marital status: Married    Spouse name: N/A  . Number of children: N/A  . Years of education: N/A    Occupational History  . 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 Smoking status: Former Smoker    Packs/day: 1.50    Years: 60.00    Types: Cigarettes    Quit date: 12/31/2012  . Smokeless tobacco: Never Used  . Alcohol use No  . Drug use: No  . Sexual activity: Yes    Birth control/ protection: None   Other Topics Concern  . None   Social History Narrative  . None     ROS: Except as mentioned 10 point review of systems is negative    Objective: Vital signs in last 24 hours: Temp:  [97.5 F (36.4 C)-97.9 F (36.6 C)] 97.9 F (36.6 C) (05/31 0721) Pulse Rate:  [44-101] 94 (05/31 0721) Resp:  [15-29] 23 (05/31 0400) BP: (94-161)/(56-81) 150/78 (05/31 0400) SpO2:  [81 %-98 %] 93 % (05/31 0721) Weight change:  Last BM Date: 02/28/17  Intake/Output from previous day: 05/30 0701 - 05/31 0700 In: 1742.5 [P.O.:1440; I.V.:2.5; IV Piggyback:300] Out: 1950 [Urine:1950]  PHYSICAL EXAM Constitutional: He is awake and alert and in no acute distress. Eyes: Pupils react EOMI. Ears nose mouth and throat: His mucous membranes are moist. His hearing is grossly normal. His throat is clear. Cardiovascular: His heart is  regular without gallop. He has trace if any edema. Respiratory: His respiratory effort is normal. His lungs show bilateral wheezes and rhonchi. He has some crackles more in the right base than anywhere else. Gastrointestinal: His abdomen is soft with no masses. Bowel sounds are present and active. Skin: He is mildly pale but his skin is warm and dry. Musculoskeletal: Normal strength. Neurological: No focal abnormalities psychiatric: Normal mood and affect  Lab Results: Basic Metabolic Panel:  Recent Labs  02/26/17 1517  02/28/17 0446 03/01/17 0509  NA 134*  < > 133* 136  K 4.9  < > 4.4 4.4  CL 101  < > 101 101  CO2 25  < > 22 27  GLUCOSE 145*  < > 203* 174*  BUN 37*  < > 38* 36*  CREATININE 1.35*  < > 1.23 1.08  CALCIUM 8.2*  < > 8.0*  8.6*  MG 2.0  --   --   --   < > = values in this interval not displayed. Liver Function Tests: No results for input(s): AST, ALT, ALKPHOS, BILITOT, PROT, ALBUMIN in the last 72 hours. No results for input(s): LIPASE, AMYLASE in the last 72 hours. No results for input(s): AMMONIA in the last 72 hours. CBC:  Recent Labs  02/27/17 0418 02/28/17 0446  WBC 16.7* 13.9*  HGB 10.0* 9.3*  HCT 31.7* 29.2*  MCV 80.3 79.6  PLT 375 342   Cardiac Enzymes:  Recent Labs  02/26/17 0931  TROPONINI 0.22*   BNP: No results for input(s): PROBNP in the last 72 hours. D-Dimer: No results for input(s): DDIMER in the last 72 hours. CBG:  Recent Labs  02/27/17 2135 02/28/17 0727 02/28/17 1131 02/28/17 1626 02/28/17 2127 03/01/17 0719  GLUCAP 209* 195* 366* 309* 247* 162*   Hemoglobin A1C:  Recent Labs  02/28/17 0446  HGBA1C 13.3*   Fasting Lipid Panel: No results for input(s): CHOL, HDL, LDLCALC, TRIG, CHOLHDL, LDLDIRECT in the last 72 hours. Thyroid Function Tests:  Recent Labs  02/26/17 0931  TSH 2.757   Anemia Panel: No results for input(s): VITAMINB12, FOLATE, FERRITIN, TIBC, IRON, RETICCTPCT in the last 72 hours. Coagulation: No results for input(s): LABPROT, INR in the last 72 hours. Urine Drug Screen: Drugs of Abuse  No results found for: LABOPIA, COCAINSCRNUR, LABBENZ, AMPHETMU, THCU, LABBARB  Alcohol Level: No results for input(s): ETH in the last 72 hours. Urinalysis: No results for input(s): COLORURINE, LABSPEC, PHURINE, GLUCOSEU, HGBUR, BILIRUBINUR, KETONESUR, PROTEINUR, UROBILINOGEN, NITRITE, LEUKOCYTESUR in the last 72 hours.  Invalid input(s): APPERANCEUR Misc. Labs:   ABGS: No results for input(s): PHART, PO2ART, TCO2, HCO3 in the last 72 hours.  Invalid input(s): PCO2   MICROBIOLOGY: Recent Results (from the past 240 hour(s))  Blood Culture (routine x 2)     Status: None (Preliminary result)   Collection Time: 02/25/17  8:58 AM  Result Value  Ref Range Status   Specimen Description BLOOD LEFT HAND  Final   Special Requests   Final    BOTTLES DRAWN AEROBIC AND ANAEROBIC Blood Culture adequate volume   Culture NO GROWTH 4 DAYS  Final   Report Status PENDING  Incomplete  Blood Culture (routine x 2)     Status: None (Preliminary result)   Collection Time: 02/25/17  9:05 AM  Result Value Ref Range Status   Specimen Description LEFT ANTECUBITAL  Final   Special Requests   Final    BOTTLES DRAWN AEROBIC AND ANAEROBIC Blood Culture adequate volume   Culture  NO GROWTH 4 DAYS  Final   Report Status PENDING  Incomplete  MRSA PCR Screening     Status: Abnormal   Collection Time: 02/25/17  8:22 PM  Result Value Ref Range Status   MRSA by PCR POSITIVE (A) NEGATIVE Final    Comment:        The GeneXpert MRSA Assay (FDA approved for NASAL specimens only), is one component of a comprehensive MRSA colonization surveillance program. It is not intended to diagnose MRSA infection nor to guide or monitor treatment for MRSA infections. RESULT CALLED TO, READ BACK BY AND VERIFIED WITH: KOGER,L @ 505 BY MATTHEWS,B 02/26/17     Studies/Results: Dg Chest 2 View  Result Date: 02/27/2017 CLINICAL DATA:  Followup pneumonia.  Shortness of breath. EXAM: CHEST  2 VIEW COMPARISON:  02/25/2017 FINDINGS: Normal heart size. Small pleural effusions are identified. Bilateral multifocal airspace consolidation is identified involving the right upper lobe, right lower lobe and left lower lobe. There maybe central cavitation within the left lower lobe opacification. There is mild diffuse edema noted. Prominence of the hilar structures are identified bilaterally. Cannot rule out adenopathy. IMPRESSION: 1. Bilateral multifocal airspace consolidation is again noted which in the acute setting is compatible with pneumonia. There may be new cavitation associated with left lower lobe airspace consolidation. Consider further evaluation with contrast enhanced CT of the  chest. 2. Cannot rule out bilateral hilar adenopathy. Attention on CT is advised. Electronically Signed   By: Kerby Moors M.D.   On: 02/27/2017 10:48   Ct Chest W Contrast  Result Date: 02/28/2017 CLINICAL DATA:  Admitted with pneumonia, shortness of breath and cough. EXAM: CT CHEST WITH CONTRAST TECHNIQUE: Multidetector CT imaging of the chest was performed during intravenous contrast administration. CONTRAST:  64mL ISOVUE-300 IOPAMIDOL (ISOVUE-300) INJECTION 61% COMPARISON:  Chest radiograph - 02/27/2017 ; 02/25/2017; 07/24/2016 FINDINGS: Cardiovascular: Borderline cardiomegaly. Coronary artery calcifications. No pericardial effusion. Mediastinum/Nodes: Mediastinal and hilar lymphadenopathy with index pretracheal lymph node measuring 1.2 cm in greatest short axis diameter (59, series 20), index high right paratracheal lymph node measuring 0.9 cm (image 34, series 2), index right infrahilar lymph node measuring 1.1 cm (image 87) and index left infrahilar lymph node measuring 1.2 cm (image 86). No axillary lymphadenopathy. Lungs/Pleura: Extensive bilateral masslike airspace opacities with index opacity with the right lower lobe measuring approximately 8.0 x 4.0 cm (image 97, series 4), index opacity within the right upper lobe measuring approximately 6.0 x 5.2 cm and index opacity within the superior segment of the left lower lobe measuring approximately 6.2 x 3.2 cm (image 87, series 4). All these masslike opacities are associated with rather extensive adjacent ground-glass. Small right and trace left-sided pleural effusions. There is narrowing of the bilateral lower lobe bronchi however the bronchial airways appear patent. Advanced apical predominant mixed centrilobular and paraseptal emphysematous change. No pneumothorax. Upper Abdomen: Evaluation of the upper abdomen is degraded secondary to patient respiratory artifact. Potential ill-defined stranding about the imaged cranial aspect of the gallbladder,  could be artifactual due to patient motion. Atherosclerotic plaque with the abdominal aorta. The pancreas is largely fatty replaced. Musculoskeletal: No acute or aggressive osseous abnormalities. DDD within the image caudal aspect of the cervical spine. Degenerative change of the bilateral sternoclavicular joints. Regional soft tissues appear normal. Normal appearance of the thyroid gland. IMPRESSION: 1. Extensive bilateral masslike consolidative opacities with associated mediastinal and hilar lymphadenopathy, likely progressed compared to chest radiograph performed 02/25/2017 and worrisome for progression of extensive multifocal infection. Further evaluation with  short-term follow-up contrast-enhanced chest CT (approximately 4-6 weeks after treatment) is recommended to ensure resolution and exclude the presence of a discrete underlying pulmonary mass. 2. Aortic Atherosclerosis (ICD10-I70.0) and Emphysema (ICD10-J43.9). Electronically Signed   By: Sandi Mariscal M.D.   On: 02/28/2017 10:16    Medications:  Prior to Admission:  Prescriptions Prior to Admission  Medication Sig Dispense Refill Last Dose  . albuterol (PROVENTIL) (2.5 MG/3ML) 0.083% nebulizer solution INHALE 1 VIAL VIA NEBULIZER EVERY 6 HOURS AS NEEDED FOR WHEEZING OR SHORTNESS OF BREATH 360 mL 5 Taking  . aspirin 81 MG EC tablet TAKE 1 TABLET BY MOUTH ONCE DAILY 30 tablet 3 Taking  . budesonide (PULMICORT) 0.5 MG/2ML nebulizer solution Take 2 mLs (0.5 mg total) by nebulization 2 (two) times daily. 120 mL 5 Taking  . furosemide (LASIX) 20 MG tablet Take 2 each morning. 60 tablet 3 Taking  . Insulin Detemir (LEVEMIR FLEXTOUCH) 100 UNIT/ML Pen 70 units at bedtime 15 mL 11 Taking  . ipratropium (ATROVENT) 0.02 % nebulizer solution Take 2.5 mLs (0.5 mg total) by nebulization 4 (four) times daily. 25 mL 12 Taking  . losartan (COZAAR) 100 MG tablet Take 1 tablet (100 mg total) by mouth daily. 30 tablet 5 Taking  . metFORMIN (GLUCOPHAGE) 1000 MG  tablet Take 1 tablet (1,000 mg total) by mouth 2 (two) times daily. 60 tablet 5 Taking  . omeprazole (PRILOSEC) 20 MG capsule Take 1 capsule (20 mg total) by mouth daily. 30 capsule 3 Taking  . ONE TOUCH ULTRA TEST test strip CHECK FASTING BLOOD SUGAR TWICE DAILY 100 each 4 Taking  . ONE TOUCH ULTRA TEST test strip USE TO CHECK BLOOD SUGAR EACH MORNING AND THEN 2 HOURS AFTER ANY MEAL 100 each 2 Taking  . potassium chloride SA (K-DUR,KLOR-CON) 20 MEQ tablet Take 1 tablet (20 mEq total) by mouth daily. 30 tablet 3 Taking  . pravastatin (PRAVACHOL) 80 MG tablet Take 1 tablet (80 mg total) by mouth at bedtime. 30 tablet 5 Taking  . PROAIR RESPICLICK 268 (90 Base) MCG/ACT AEPB Inhale 108 mcg into the lungs 3 (three) times daily.   Taking  . Tiotropium Bromide-Olodaterol (STIOLTO RESPIMAT) 2.5-2.5 MCG/ACT AERS Inhale 2 Inhalers into the lungs daily. 1 Inhaler 5 Taking   Scheduled: . apixaban  5 mg Oral BID  . Chlorhexidine Gluconate Cloth  6 each Topical Q0600  . diltiazem  30 mg Oral Q8H  . guaiFENesin  600 mg Oral BID  . insulin aspart  0-15 Units Subcutaneous TID WC  . insulin aspart protamine- aspart  13 Units Subcutaneous Q supper  . insulin aspart protamine- aspart  18 Units Subcutaneous Q breakfast  . ipratropium  0.5 mg Nebulization Q6H  . levalbuterol  0.63 mg Nebulization Q6H  . mupirocin ointment  1 application Nasal BID  . polyethylene glycol  17 g Oral Daily  . predniSONE  40 mg Oral Q breakfast  . senna  1 tablet Oral BID   Continuous: . azithromycin Stopped (02/28/17 1759)  . piperacillin-tazobactam (ZOSYN)  IV 3.375 g (03/01/17 0358)   TMH:[id number] **OR** acetaminophen, guaiFENesin, levalbuterol, ondansetron **OR** ondansetron (ZOFRAN) IV, polyethylene glycol, traZODone  Assesment: He was admitted with sepsis related to aspiration pneumonia. Initially the pneumonia appeared to be on the right side but CT shows it scattered throughout his lung. He is on appropriate  treatment for that. He has COPD at baseline and says he is on a rescue inhaler at home wears oxygen at home and has a nebulizer  at home. He might be helped by having maintenance inhalers. He has chronic diastolic heart failure which appears to be stable. He had troponin elevation not thought to represent acute coronary syndrome Principal Problem:   Sepsis ([place]) Active Problems:   Diabetes mellitus type 2, uncontrolled ([name])   COPD (chronic obstructive pulmonary disease) ([name])   Hypertension   Emesis, persistent   Constipation   Overflow diarrhea/Constipation   Rt Sided Aspiration pneumonia ([name])   Chronic diastolic heart failure ([name])   Elevated troponin    Plan: Continue current treatments. He is generally better. He is on appropriate medications. Agree with speech consultation. discsssed with Dr. Adair Patter    LOS: 4 days   Nelli Swalley L 03/01/2017, 7:58 AM

## 2017-03-01 NOTE — Progress Notes (Signed)
PROGRESS NOTE    Edward [name]  QJF:[id number] DOB: Jun 13, 1935 DOA: 02/25/2017 PCP: [name]    Brief Narrative:  82 year old Caucasian male with past medical history relevant for COPD/pulmonary fibrosis as well as history of paroxysmal atrial fibrillation, hypertension and diabetes who was admitted on 02/25/2017 with sepsis from pulmonary source presumed secondary to left-sided aspiration pneumonia in the setting of intractable emesis. On 02/25/2017 post admission patient became very tachycardic was found to be in A. fib with RVR, responded well to IV Cardizem drip which has since been discontinued. Patient remains in sinus rhythm at this time. 02/27/17- patient's went is afebrile RVR again, rates- 140-150s. Cardiology consulted and patient was transitioned to PO diltiazem.  CXR showing bilateral multifocal airspace consolidation with questionable new cavity and adenopathy.  CT chest with contrast follow up showing Extensive bilateral masslike consolidative opacities with associated mediastinal and hilar lymphadenopathy, likely progressed compared to chest radiograph performed 02/25/2017 and worrisome for progression of extensive multifocal infection. Further evaluation with short-term follow-up contrast-enhanced chest CT (approximately 4-6 weeks after treatment) is recommended to ensure resolution and exclude the presence of a discrete underlying pulmonary mass. Aortic Atherosclerosis and Emphysema.  Pulmonology consulted.   Assessment & Plan:   Principal Problem:   Sepsis ([place]) Active Problems:   Diabetes mellitus type 2, uncontrolled ([name])   COPD (chronic obstructive pulmonary disease) ([name])   Hypertension   Emesis, persistent   Constipation   Overflow diarrhea/Constipation   Rt Sided Aspiration pneumonia ([name])   Chronic diastolic heart failure ([name])   Elevated troponin   Sepsis Secondary to pulmonary source - continue IV Zosyn for presumed aspiration pneumonia - SLP  consulted for evaluation of possible aspiration - blood cultures showing NG x 4 days - CT scan on 5/30 showing Extensive bilateral masslike consolidative opacities with associated mediastinal and hilar lymphadenopathy, likely progressed compared to chest radiograph performed 02/25/2017 and worrisome for progression of extensive multifocal infection. Further evaluation with short-term follow-up contrast-enhanced chest CT (approximately 4-6 weeks after treatment) is recommended to ensure resolution and exclude the presence of a discrete underlying pulmonary mass.  Rt sided aspiration pneumonia - in the setting of intractable emesis - emesis has improved and is controlled and patient is tolerating PO - SLP consulted - continue IV Zosyn, bronchodilators, oxygen supplementation and mucolytics as ordered  Atrial fibrillation - cardiology consulted - PO diltiazem per cardioloy - starting anticoagulation 2/2 recurrent paroxysmal atrial fib- Eliquis  Uncontrolled DM - hemoglobin A1c of 13.3 - Novolog 70/30 of 18 units qAM and 13units qPM, moderate SSI - diabetes educator consulted  HFpEF - patient has history of chronic diastolic dysfunction CHF - monitor for signs of volume overload  COPD/Pulm Fibrosis - no wheezing noted on exam - bronchodilators and mucolytics as ordered - continue supplemental oxygen.   HTN - BP is stable at this time - continue to hold losartan and Lasix due to AKI  N/V/D - intractable emesis and diarrhea in the setting of constipation - no reports of diarrhea or emesis per patient today  AKI - Cr improved to 1.08 today - consider restarting lasix  DVT prophylaxis: SCD/ subq heparin Code Status: Full code Family Communication: no family bedside Disposition Plan: pending improvement in heart rate and further evaluation with chest CT; will likely discharge back to previous home environment   Consultants:   Cardiology  Diabetes  education  CM  SLP  PT  Pulmonology  Procedures:   None  Antimicrobials:   Zosyn  Azithromycin    Subjective: Patient sitting up in chair.  Says he did not sleep last night.  Having significant cough with eating.  Says he is unable to lay flat but this is not new.  Breathing almost back to baseline.    Objective: Vitals:   03/01/17 0200 03/01/17 0222 03/01/17 0400 03/01/17 0721  BP: (!) 145/74  (!) 150/78   Pulse: 88  96 94  Resp: (!) 21  (!) 23   Temp:   97.8 F (36.6 C) 97.9 F (36.6 C)  TempSrc:   Oral Oral  SpO2: 98% 94% 94% 93%  Weight:      Height:        Intake/Output Summary (Last 24 hours) at 03/01/17 0759 Last data filed at 03/01/17 0658  Gross per 24 hour  Intake             1740 ml  Output             1950 ml  Net             -210 ml   Filed Weights   02/25/17 1119 02/26/17 0400 02/28/17 0500  Weight: 72.6 kg (160 lb) 77 kg (169 lb 12.1 oz) 82 kg (180 lb 12.4 oz)    Examination:  General exam: Appears calm and comfortable  Respiratory system: rales in right lung base and rhonchi in middle lungs bilaterally Cardiovascular system: S1 & S2 heard, RRR. No JVD, murmurs, rubs, gallops or clicks. No pedal edema. Gastrointestinal system: Abdomen is nondistended, soft and nontender. No organomegaly or masses felt. Normal bowel sounds heard. Central nervous system: No focal neurological deficits. Alert Extremities: Symmetric 5 x 5 power. Psychiatry:  Mood & affect appropriate.     Data Reviewed: I have personally reviewed following labs and imaging studies  CBC:  Recent Labs Lab 02/25/17 0847 02/26/17 0415 02/27/17 0418 02/28/17 0446  WBC 13.6* 14.5* 16.7* 13.9*  NEUTROABS 11.8*  --   --   --   HGB 11.4* 9.7* 10.0* 9.3*  HCT 35.7* 29.9* 31.7* 29.2*  MCV 80.8 79.1 80.3 79.6  PLT 322 341 375 431   Basic Metabolic Panel:  Recent Labs Lab 02/25/17 1440 02/26/17 1517 02/27/17 0418 02/28/17 0446 03/01/17 0509  NA 130* 134* 134* 133*  136  K 5.5* 4.9 4.4 4.4 4.4  CL 96* 101 102 101 101  CO2 24 25 23 22 27   GLUCOSE 470* 145* 127* 203* 174*  BUN 36* 37* 35* 38* 36*  CREATININE 1.46* 1.35* 1.21 1.23 1.08  CALCIUM 8.6* 8.2* 8.0* 8.0* 8.6*  MG  --  2.0  --   --   --    GFR: Estimated Creatinine Clearance: 55.8 mL/min (by C-G formula based on SCr of 1.08 mg/dL). Liver Function Tests:  Recent Labs Lab 02/25/17 0847 02/25/17 1128  AST 24 20  ALT 19 18  ALKPHOS 75 68  BILITOT 0.4 0.4  PROT 6.9 6.1*  ALBUMIN 3.3* 2.8*    Recent Labs Lab 02/25/17 0847  LIPASE 13   No results for input(s): AMMONIA in the last 168 hours. Coagulation Profile: No results for input(s): INR, PROTIME in the last 168 hours. Cardiac Enzymes:  Recent Labs Lab 02/25/17 0847 02/25/17 2140 02/26/17 0414 02/26/17 0931  TROPONINI <0.03 0.10* 0.23* 0.22*   BNP (last 3 results) No results for input(s): PROBNP in the last 8760 hours. HbA1C:  Recent Labs  02/28/17 0446  HGBA1C 13.3*   CBG:  Recent Labs Lab 02/28/17 0727  02/28/17 1131 02/28/17 1626 02/28/17 2127 03/01/17 0719  GLUCAP 195* 366* 309* 247* 162*   Lipid Profile: No results for input(s): CHOL, HDL, LDLCALC, TRIG, CHOLHDL, LDLDIRECT in the last 72 hours. Thyroid Function Tests:  Recent Labs  02/26/17 0931  TSH 2.757   Anemia Panel: No results for input(s): VITAMINB12, FOLATE, FERRITIN, TIBC, IRON, RETICCTPCT in the last 72 hours. Sepsis Labs:  Recent Labs Lab 02/25/17 0857 02/25/17 2140 02/26/17 0039  LATICACIDVEN 2.43* 2.0* 1.3    Recent Results (from the past 240 hour(s))  Blood Culture (routine x 2)     Status: None (Preliminary result)   Collection Time: 02/25/17  8:58 AM  Result Value Ref Range Status   Specimen Description BLOOD LEFT HAND  Final   Special Requests   Final    BOTTLES DRAWN AEROBIC AND ANAEROBIC Blood Culture adequate volume   Culture NO GROWTH 4 DAYS  Final   Report Status PENDING  Incomplete  Blood Culture (routine x  2)     Status: None (Preliminary result)   Collection Time: 02/25/17  9:05 AM  Result Value Ref Range Status   Specimen Description LEFT ANTECUBITAL  Final   Special Requests   Final    BOTTLES DRAWN AEROBIC AND ANAEROBIC Blood Culture adequate volume   Culture NO GROWTH 4 DAYS  Final   Report Status PENDING  Incomplete  MRSA PCR Screening     Status: Abnormal   Collection Time: 02/25/17  8:22 PM  Result Value Ref Range Status   MRSA by PCR POSITIVE (A) NEGATIVE Final    Comment:        The GeneXpert MRSA Assay (FDA approved for NASAL specimens only), is one component of a comprehensive MRSA colonization surveillance program. It is not intended to diagnose MRSA infection nor to guide or monitor treatment for MRSA infections. RESULT CALLED TO, READ BACK BY AND VERIFIED WITH: KOGER,L @ 563 BY MATTHEWS,B 02/26/17          Radiology Studies: Dg Chest 2 View  Result Date: 02/27/2017 CLINICAL DATA:  Followup pneumonia.  Shortness of breath. EXAM: CHEST  2 VIEW COMPARISON:  02/25/2017 FINDINGS: Normal heart size. Small pleural effusions are identified. Bilateral multifocal airspace consolidation is identified involving the right upper lobe, right lower lobe and left lower lobe. There maybe central cavitation within the left lower lobe opacification. There is mild diffuse edema noted. Prominence of the hilar structures are identified bilaterally. Cannot rule out adenopathy. IMPRESSION: 1. Bilateral multifocal airspace consolidation is again noted which in the acute setting is compatible with pneumonia. There may be new cavitation associated with left lower lobe airspace consolidation. Consider further evaluation with contrast enhanced CT of the chest. 2. Cannot rule out bilateral hilar adenopathy. Attention on CT is advised. Electronically Signed   By: Kerby Moors M.D.   On: 02/27/2017 10:48   Ct Chest W Contrast  Result Date: 02/28/2017 CLINICAL DATA:  Admitted with pneumonia,  shortness of breath and cough. EXAM: CT CHEST WITH CONTRAST TECHNIQUE: Multidetector CT imaging of the chest was performed during intravenous contrast administration. CONTRAST:  81mL ISOVUE-300 IOPAMIDOL (ISOVUE-300) INJECTION 61% COMPARISON:  Chest radiograph - 02/27/2017 ; 02/25/2017; 07/24/2016 FINDINGS: Cardiovascular: Borderline cardiomegaly. Coronary artery calcifications. No pericardial effusion. Mediastinum/Nodes: Mediastinal and hilar lymphadenopathy with index pretracheal lymph node measuring 1.2 cm in greatest short axis diameter (59, series 20), index high right paratracheal lymph node measuring 0.9 cm (image 34, series 2), index right infrahilar lymph node measuring 1.1 cm (image 87)  and index left infrahilar lymph node measuring 1.2 cm (image 86). No axillary lymphadenopathy. Lungs/Pleura: Extensive bilateral masslike airspace opacities with index opacity with the right lower lobe measuring approximately 8.0 x 4.0 cm (image 97, series 4), index opacity within the right upper lobe measuring approximately 6.0 x 5.2 cm and index opacity within the superior segment of the left lower lobe measuring approximately 6.2 x 3.2 cm (image 87, series 4). All these masslike opacities are associated with rather extensive adjacent ground-glass. Small right and trace left-sided pleural effusions. There is narrowing of the bilateral lower lobe bronchi however the bronchial airways appear patent. Advanced apical predominant mixed centrilobular and paraseptal emphysematous change. No pneumothorax. Upper Abdomen: Evaluation of the upper abdomen is degraded secondary to patient respiratory artifact. Potential ill-defined stranding about the imaged cranial aspect of the gallbladder, could be artifactual due to patient motion. Atherosclerotic plaque with the abdominal aorta. The pancreas is largely fatty replaced. Musculoskeletal: No acute or aggressive osseous abnormalities. DDD within the image caudal aspect of the cervical  spine. Degenerative change of the bilateral sternoclavicular joints. Regional soft tissues appear normal. Normal appearance of the thyroid gland. IMPRESSION: 1. Extensive bilateral masslike consolidative opacities with associated mediastinal and hilar lymphadenopathy, likely progressed compared to chest radiograph performed 02/25/2017 and worrisome for progression of extensive multifocal infection. Further evaluation with short-term follow-up contrast-enhanced chest CT (approximately 4-6 weeks after treatment) is recommended to ensure resolution and exclude the presence of a discrete underlying pulmonary mass. 2. Aortic Atherosclerosis (ICD10-I70.0) and Emphysema (ICD10-J43.9). Electronically Signed   By: Sandi Mariscal M.D.   On: 02/28/2017 10:16        Scheduled Meds: . apixaban  5 mg Oral BID  . Chlorhexidine Gluconate Cloth  6 each Topical Q0600  . diltiazem  30 mg Oral Q8H  . guaiFENesin  600 mg Oral BID  . insulin aspart  0-15 Units Subcutaneous TID WC  . insulin aspart protamine- aspart  13 Units Subcutaneous Q supper  . insulin aspart protamine- aspart  18 Units Subcutaneous Q breakfast  . ipratropium  0.5 mg Nebulization Q6H  . levalbuterol  0.63 mg Nebulization Q6H  . mupirocin ointment  1 application Nasal BID  . polyethylene glycol  17 g Oral Daily  . predniSONE  40 mg Oral Q breakfast  . senna  1 tablet Oral BID   Continuous Infusions: . azithromycin Stopped (02/28/17 1759)  . piperacillin-tazobactam (ZOSYN)  IV 3.375 g (03/01/17 0358)     LOS: 4 days    Time spent: 30 minutes    Loretha Stapler, MD [facility] Pager [phone]  If 7PM-7AM, please contact night-coverage [email or web address] Password [email or web address] 03/01/2017, 7:59 AM

## 2017-03-01 NOTE — Progress Notes (Signed)
Inpatient Diabetes Program Recommendations  AACE/ADA: New Consensus Statement on Inpatient Glycemic Control (2015)  Target Ranges:  Prepandial:   less than 140 mg/dL      Peak postprandial:   less than 180 mg/dL (1-2 hours)      Critically ill patients:  140 - 180 mg/dL  Results for Edward [name], Edward [name] (MRN [id number]) as of 03/01/2017 07:41  Ref. Range 02/28/2017 07:27 02/28/2017 11:31 02/28/2017 16:26 02/28/2017 21:27 03/01/2017 07:19  Glucose-Capillary Latest Ref Range: 65 - 99 mg/dL 195 (H) 366 (H) 309 (H) 247 (H) 162 (H)  Results for Edward [name], Edward [name] (MRN [id number]) as of 03/01/2017 07:41  Ref. Range 10/11/2016 08:38 02/28/2017 04:46  Hemoglobin A1C Latest Ref Range: 4.8 - 5.6 % >14.0 (H) 13.3 (H)    Review of Glycemic Control  Diabetes history: DM2 Outpatient Diabetes medications:  Levemir 70 units QHS, Metformin 1000 mg BID Current orders for Inpatient glycemic control: 70/30 10 units BID, Novolog 0-5 units QHS, Novolog 4 units TID with meals for meal coverage  Inpatient Diabetes Program Recommendations: Insulin - Basal: Please consider increasing 70/30 to 18 units QAM with breakfast and 70/30 13 units QHS for supper. Correction (SSI): Please consider ordering Novolog 0-15 units TID with meals for correction if needed (continue Novolog 0-5 units QHS for bedtime correction). Insulin - Meal Coverage: Please discontinue Novolog 4 units TID with meals since 70/30 is intended to provide meal coverage and basal. HgbA1C: A1C 13.3% on 02/28/17 indicating an average glucose of 335 mg/dl over the past 2-3 months. Patient needs to follow up with PCP regarding DM control. Outpatient Discharge Planning:  Patient likely needs to be discharged on NOVOLIN 70/30 which can be purchased at [facility] for $25 per vial.  Thanks, Barnie Alderman, RN, MSN, CDE Diabetes Coordinator Inpatient Diabetes Program [phone] (Team Pager from 8am to 5pm)

## 2017-03-01 NOTE — Progress Notes (Addendum)
Progress Note  Patient Name: Edward [name] Date of Encounter: 03/01/2017    Subjective   Continued SOB and cough  Inpatient Medications    Scheduled Meds: . apixaban  5 mg Oral BID  . Chlorhexidine Gluconate Cloth  6 each Topical Q0600  . diltiazem  30 mg Oral Q8H  . guaiFENesin  600 mg Oral BID  . insulin aspart  0-15 Units Subcutaneous TID WC  . insulin aspart protamine- aspart  13 Units Subcutaneous Q supper  . insulin aspart protamine- aspart  18 Units Subcutaneous Q breakfast  . ipratropium  0.5 mg Nebulization Q6H  . levalbuterol  0.63 mg Nebulization Q6H  . mupirocin ointment  1 application Nasal BID  . polyethylene glycol  17 g Oral Daily  . predniSONE  40 mg Oral Q breakfast  . senna  1 tablet Oral BID   Continuous Infusions: . azithromycin Stopped (02/28/17 1759)  . piperacillin-tazobactam (ZOSYN)  IV Stopped (03/01/17 0758)   PRN Meds: acetaminophen **OR** acetaminophen, guaiFENesin, levalbuterol, ondansetron **OR** ondansetron (ZOFRAN) IV, polyethylene glycol, traZODone   Vital Signs    Vitals:   03/01/17 0200 03/01/17 0222 03/01/17 0400 03/01/17 0721  BP: (!) 145/74  (!) 150/78   Pulse: 88  96 94  Resp: (!) 21  (!) 23   Temp:   97.8 F (36.6 C) 97.9 F (36.6 C)  TempSrc:   Oral Oral  SpO2: 98% 94% 94% 93%  Weight:      Height:        Intake/Output Summary (Last 24 hours) at 03/01/17 0814 Last data filed at 03/01/17 0658  Gross per 24 hour  Intake             1740 ml  Output             1950 ml  Net             -210 ml   Filed Weights   02/25/17 1119 02/26/17 0400 02/28/17 0500  Weight: 160 lb (72.6 kg) 169 lb 12.1 oz (77 kg) 180 lb 12.4 oz (82 kg)    Telemetry    SR and sinus tach - Personally Reviewed  ECG    n/a  Physical Exam   GEN: No acute distress.   Neck: No JVD Cardiac: RRR, no murmurs, rubs, or gallops.  Respiratory: bilateral wheezing, coarse breath sounds GI: Soft, nontender, non-distended  MS: Trace bilaterla  edema; No deformity. Neuro:  Nonfocal  Psych: Normal affect   Labs    Chemistry Recent Labs Lab 02/25/17 0847 02/25/17 1128  02/27/17 0418 02/28/17 0446 03/01/17 0509  NA 120* 125*  < > 134* 133* 136  K 5.8* 5.4*  < > 4.4 4.4 4.4  CL 85* 93*  < > 102 101 101  CO2 29 23  < > 23 22 27   GLUCOSE 752* 614*  < > 127* 203* 174*  BUN 40* 38*  < > 35* 38* 36*  CREATININE 1.54* 1.37*  < > 1.21 1.23 1.08  CALCIUM 8.9 8.2*  < > 8.0* 8.0* 8.6*  PROT 6.9 6.1*  --   --   --   --   ALBUMIN 3.3* 2.8*  --   --   --   --   AST 24 20  --   --   --   --   ALT 19 18  --   --   --   --   ALKPHOS 75 68  --   --   --   --  BILITOT 0.4 0.4  --   --   --   --   GFRNONAA 41* 47*  < > 55* 54* >60  GFRAA 48* 55*  < > >60 >60 >60  ANIONGAP 6 9  < > 9 10 8   < > = values in this interval not displayed.   Hematology Recent Labs Lab 02/26/17 0415 02/27/17 0418 02/28/17 0446  WBC 14.5* 16.7* 13.9*  RBC 3.78* 3.95* 3.67*  HGB 9.7* 10.0* 9.3*  HCT 29.9* 31.7* 29.2*  MCV 79.1 80.3 79.6  MCH 25.7* 25.3* 25.3*  MCHC 32.4 31.5 31.8  RDW 16.8* 17.3* 17.6*  PLT 341 375 342    Cardiac Enzymes Recent Labs Lab 02/25/17 0847 02/25/17 2140 02/26/17 0414 02/26/17 0931  TROPONINI <0.03 0.10* 0.23* 0.22*   No results for input(s): TROPIPOC in the last 168 hours.   BNPNo results for input(s): BNP, PROBNP in the last 168 hours.   DDimer No results for input(s): DDIMER in the last 168 hours.   Radiology    Dg Chest 2 View  Result Date: 02/27/2017 CLINICAL DATA:  Followup pneumonia.  Shortness of breath. EXAM: CHEST  2 VIEW COMPARISON:  02/25/2017 FINDINGS: Normal heart size. Small pleural effusions are identified. Bilateral multifocal airspace consolidation is identified involving the right upper lobe, right lower lobe and left lower lobe. There maybe central cavitation within the left lower lobe opacification. There is mild diffuse edema noted. Prominence of the hilar structures are identified  bilaterally. Cannot rule out adenopathy. IMPRESSION: 1. Bilateral multifocal airspace consolidation is again noted which in the acute setting is compatible with pneumonia. There may be new cavitation associated with left lower lobe airspace consolidation. Consider further evaluation with contrast enhanced CT of the chest. 2. Cannot rule out bilateral hilar adenopathy. Attention on CT is advised. Electronically Signed   By: Kerby Moors M.D.   On: 02/27/2017 10:48   Ct Chest W Contrast  Result Date: 02/28/2017 CLINICAL DATA:  Admitted with pneumonia, shortness of breath and cough. EXAM: CT CHEST WITH CONTRAST TECHNIQUE: Multidetector CT imaging of the chest was performed during intravenous contrast administration. CONTRAST:  3mL ISOVUE-300 IOPAMIDOL (ISOVUE-300) INJECTION 61% COMPARISON:  Chest radiograph - 02/27/2017 ; 02/25/2017; 07/24/2016 FINDINGS: Cardiovascular: Borderline cardiomegaly. Coronary artery calcifications. No pericardial effusion. Mediastinum/Nodes: Mediastinal and hilar lymphadenopathy with index pretracheal lymph node measuring 1.2 cm in greatest short axis diameter (59, series 20), index high right paratracheal lymph node measuring 0.9 cm (image 34, series 2), index right infrahilar lymph node measuring 1.1 cm (image 87) and index left infrahilar lymph node measuring 1.2 cm (image 86). No axillary lymphadenopathy. Lungs/Pleura: Extensive bilateral masslike airspace opacities with index opacity with the right lower lobe measuring approximately 8.0 x 4.0 cm (image 97, series 4), index opacity within the right upper lobe measuring approximately 6.0 x 5.2 cm and index opacity within the superior segment of the left lower lobe measuring approximately 6.2 x 3.2 cm (image 87, series 4). All these masslike opacities are associated with rather extensive adjacent ground-glass. Small right and trace left-sided pleural effusions. There is narrowing of the bilateral lower lobe bronchi however the  bronchial airways appear patent. Advanced apical predominant mixed centrilobular and paraseptal emphysematous change. No pneumothorax. Upper Abdomen: Evaluation of the upper abdomen is degraded secondary to patient respiratory artifact. Potential ill-defined stranding about the imaged cranial aspect of the gallbladder, could be artifactual due to patient motion. Atherosclerotic plaque with the abdominal aorta. The pancreas is largely fatty replaced. Musculoskeletal: No acute  or aggressive osseous abnormalities. DDD within the image caudal aspect of the cervical spine. Degenerative change of the bilateral sternoclavicular joints. Regional soft tissues appear normal. Normal appearance of the thyroid gland. IMPRESSION: 1. Extensive bilateral masslike consolidative opacities with associated mediastinal and hilar lymphadenopathy, likely progressed compared to chest radiograph performed 02/25/2017 and worrisome for progression of extensive multifocal infection. Further evaluation with short-term follow-up contrast-enhanced chest CT (approximately 4-6 weeks after treatment) is recommended to ensure resolution and exclude the presence of a discrete underlying pulmonary mass. 2. Aortic Atherosclerosis (ICD10-I70.0) and Emphysema (ICD10-J43.9). Electronically Signed   By: Sandi Mariscal M.D.   On: 02/28/2017 10:16    Cardiac Studies    Patient Profile     82 y.o. male malehistory of severe COPD, DM2, HTN, HL, chronic diastolic HF, PAF admitted with sepsis and aspiration pneumonia. Cardiology is consulted for transient afib, chronic diastolic HF, and elevated troponin. Patient denies any chest pain or palpitations.   Assessment & Plan    1. Elevated troponin - mild elevation in setting of sepsis and tachycardia, trending down. Echo with normal LVEF - prior stress test with inferior infarct with very mild ischemia - suspect demand ischemia in setting of probable chronic occlusive CAD, as opposed to acute occlusive  disease/ACS - no plans for ischemic testing at this time   2. Chronic diastolic HF - - echo with normal LVEF, grade II diastolic dysfunction - CXR with primarily pneumonia - appears euvolemic on exam, would not diurese at this time.   3.  Afib - afib in setting of systemic illness with pneumonia and COPD exacerbation and multiple electrolyte abnormalities. Initial episode short in duration, did have recurrent episode yesterday for extended perior of time.  - at this time unclear if isolated event in this setting.  - rate controlled with oral dilt, we will change to long acting 180mg  daily.  -  CHADS2Vasc score is 5, with recurrent afib episodes this admit started on anticoag. May be worth reevaluating over time whether contiued anticoag is indicated, afib may be isolated in this setting of severe respiraotry infection  4. COPD/Pulm fibrosis - management per primary team  5. Pneumonia - management per primary team   We will follow from a distance, follow telemetry and heart rates tomorrow.   Carlyle Dolly, M.D  Signed, Carlyle Dolly, MD  03/01/2017, 8:14 AM

## 2017-03-01 NOTE — Evaluation (Signed)
Physical Therapy Evaluation Patient Details Name: Edward [name] MRN: [id number] DOB: 12-24-1934 Today's Date: 03/01/2017   History of Present Illness  Edward [name]  is a 82 y.o. male, with past medical history relevant for noncompliance, diabetes and hypertension and history of COPD/pulmonary fibrosis who presents to the ED with a 2 day history of emesis and diarrhea. Patient had intractable emesis overnight, I suspect he probably aspirated. Emesis was without bile or blood . Has chills and some shortness of breath this morning.  Clinical Impression  Pt received sitting up in chair with wife and son present and was agreeable to PT evaluation. Pt from home where he was independent with all ADLs, IADLs, and ambulation without an AD. Pt presently minimally unsteady with sit <> stands and amb with RW. Limited in ambulation in the room due to lines and leads, but pt was able to amb fwd/retro 9fx2laps and then amb 43fto EOB and back to chair (1243fotal in room) with min guard. Pt demo improper use of RW as he allowed his feet to get outside its BOS a couple of times, but he was able to self-correct. Pt currently functioning below PLOF and PT is recommending HHPT and RW upon d/c. Pt and family agreeable to this plan. Pt left in chair with family still present and all other needs met.    Follow Up Recommendations Home health PT    Equipment Recommendations  Rolling walker with 5" wheels    Recommendations for Other Services       Precautions / Restrictions Precautions Precautions: Fall Precaution Comments: due to lines and leads, min unsteadiness with transfers and gait      Mobility  Bed Mobility               General bed mobility comments: did not assess as pt received sitting up in chair and wished to return to it at end of session  Transfers Overall transfer level: Needs assistance Equipment used: Rolling walker (2 wheeled) Transfers: Sit to/from Stand Sit to Stand: Min  guard         General transfer comment: pt with min unsteadiness with sit <> stands, had mild posterior lean with initial transfer and was able to correct with min tactile cueing  Ambulation/Gait Ambulation/Gait assistance: Min guard Ambulation Distance (Feet): 12 Feet (total in room) Assistive device: Rolling walker (2 wheeled) Gait Pattern/deviations: Decreased stride length;Step-through pattern   Gait velocity interpretation: <1.8 ft/sec, indicative of risk for recurrent falls General Gait Details: limited in amount of space available to ambulate because of lines and leads; Pt able to amb 2ft81fd and retro x 2, and then able to ambulate 4ft 39fm chair to end of bed and back. to chair.  Stairs            Wheelchair Mobility    Modified Rankin (Stroke Patients Only)       Balance Overall balance assessment: Needs assistance Sitting-balance support: No upper extremity supported Sitting balance-Leahy Scale: Normal     Standing balance support: Bilateral upper extremity supported Standing balance-Leahy Scale: Fair                               Pertinent Vitals/Pain Pain Assessment: No/denies pain    Home Living Family/patient expects to be discharged to:: Private residence Living Arrangements: Spouse/significant other;Children Available Help at Discharge: Available 24 hours/day Type of Home: Mobile home Home Access: Stairs to enter Entrance  Stairs-Rails: Can reach both Entrance Stairs-Number of Steps: 5 Home Layout: One level Home Equipment: None      Prior Function Level of Independence: Independent         Comments: independent with all ADLs, IADLs     Hand Dominance   Dominant Hand: Right    Extremity/Trunk Assessment   Upper Extremity Assessment Upper Extremity Assessment: Overall WFL for tasks assessed    Lower Extremity Assessment Lower Extremity Assessment: Generalized weakness       Communication   Communication: No  difficulties  Cognition Arousal/Alertness: Awake/alert Behavior During Therapy: WFL for tasks assessed/performed Overall Cognitive Status: Within Functional Limits for tasks assessed                                        General Comments      Exercises Other Exercises Other Exercises: standing march x 5 BLE with BUE supported on RW   Assessment/Plan    PT Assessment Patient needs continued PT services  PT Problem List Decreased strength;Decreased activity tolerance;Decreased balance       PT Treatment Interventions DME instruction;Gait training;Functional mobility training;Therapeutic activities;Therapeutic exercise;Balance training;Patient/family education    PT Goals (Current goals can be found in the Care Plan section)  Acute Rehab PT Goals Patient Stated Goal: to go home PT Goal Formulation: With patient/family Time For Goal Achievement: 03/04/17 Potential to Achieve Goals: Good    Frequency Min 3X/week   Barriers to discharge        Co-evaluation               AM-PAC PT "6 Clicks" Daily Activity  Outcome Measure Difficulty turning over in bed (including adjusting bedclothes, sheets and blankets)?: None Difficulty moving from lying on back to sitting on the side of the bed? : None Difficulty sitting down on and standing up from a chair with arms (e.g., wheelchair, bedside commode, etc,.)?: A Little Help needed moving to and from a bed to chair (including a wheelchair)?: A Little Help needed walking in hospital room?: A Little Help needed climbing 3-5 steps with a railing? : A Lot 6 Click Score: 19    End of Session Equipment Utilized During Treatment: Gait belt;Oxygen (3L) Activity Tolerance: Patient tolerated treatment well;No increased pain Patient left: in chair;with call bell/phone within reach;with family/visitor present Nurse Communication: Mobility status (mobility sheet left in room and spoke with Lattie Haw, Therapist, sports) PT Visit Diagnosis:  Unsteadiness on feet (R26.81);Muscle weakness (generalized) (M62.81)    Time: 2500-3704 PT Time Calculation (min) (ACUTE ONLY): 33 min   Charges:   PT Evaluation $PT Eval Low Complexity: 1 Procedure PT Treatments $Therapeutic Activity: 8-22 mins   PT G Codes:   PT G-Codes **NOT FOR INPATIENT CLASS** Functional Assessment Tool Used: AM-PAC 6 Clicks Basic Mobility;Clinical judgement Functional Limitation: Mobility: Walking and moving around Mobility: Walking and Moving Around Current Status (U8891): At least 20 percent but less than 40 percent impaired, limited or restricted Mobility: Walking and Moving Around Goal Status [phone]): At least 1 percent but less than 20 percent impaired, limited or restricted     Geraldine Solar PT, DPT

## 2017-03-02 ENCOUNTER — Inpatient Hospital Stay (HOSPITAL_COMMUNITY): Payer: Medicare Other

## 2017-03-02 DIAGNOSIS — I4891 Unspecified atrial fibrillation: Secondary | ICD-10-CM

## 2017-03-02 DIAGNOSIS — I10 Essential (primary) hypertension: Secondary | ICD-10-CM

## 2017-03-02 LAB — CULTURE, BLOOD (ROUTINE X 2)
Culture: NO GROWTH
Culture: NO GROWTH
Special Requests: ADEQUATE
Special Requests: ADEQUATE

## 2017-03-02 LAB — GLUCOSE, CAPILLARY
GLUCOSE-CAPILLARY: 119 mg/dL — AB (ref 65–99)
GLUCOSE-CAPILLARY: 224 mg/dL — AB (ref 65–99)
Glucose-Capillary: 240 mg/dL — ABNORMAL HIGH (ref 65–99)
Glucose-Capillary: 199 mg/dL — ABNORMAL HIGH (ref 65–99)

## 2017-03-02 MED ORDER — ALUM & MAG HYDROXIDE-SIMETH 200-200-20 MG/5ML PO SUSP
15.0000 mL | ORAL | Status: DC | PRN
Start: 2017-03-02 — End: 2017-03-03

## 2017-03-02 MED ORDER — DILTIAZEM LOAD VIA INFUSION
15.0000 mg | Freq: Once | INTRAVENOUS | Status: AC
  Administered 2017-03-02: 15 mg via INTRAVENOUS
  Filled 2017-03-02: qty 15

## 2017-03-02 MED ORDER — DILTIAZEM HCL-DEXTROSE 100-5 MG/100ML-% IV SOLN (PREMIX)
5.0000 mg/h | INTRAVENOUS | Status: DC
  Administered 2017-03-02: 10 mg/h via INTRAVENOUS
  Filled 2017-03-02: qty 100

## 2017-03-02 MED ORDER — DILTIAZEM LOAD VIA INFUSION
10.0000 mg | Freq: Once | INTRAVENOUS | Status: AC
  Administered 2017-03-02: 10 mg via INTRAVENOUS
  Filled 2017-03-02: qty 10

## 2017-03-02 MED ORDER — DILTIAZEM HCL 100 MG IV SOLR
INTRAVENOUS | Status: AC
  Filled 2017-03-02: qty 100

## 2017-03-02 MED ORDER — DILTIAZEM HCL 100 MG IV SOLR
5.0000 mg/h | INTRAVENOUS | Status: DC
  Administered 2017-03-02: 5 mg/h via INTRAVENOUS

## 2017-03-02 MED ORDER — DILTIAZEM HCL 60 MG PO TABS
60.0000 mg | ORAL_TABLET | Freq: Four times a day (QID) | ORAL | Status: DC
  Administered 2017-03-02 (×4): 60 mg via ORAL
  Filled 2017-03-02 (×4): qty 1

## 2017-03-02 NOTE — Progress Notes (Signed)
Modified Barium Swallow Progress Note  Patient Details  Name: Edward [name] MRN: [id number] Date of Birth: 1935-09-02  Today's Date: 03/02/2017  Modified Barium Swallow completed.    Brief recommendations include the following:  Clinical Impression  Pt currently showing swallow function within functional limits during this evaluation. No penetration or aspiration was observed across consistencies; laryngeal mobility and timing of swallow appeared within normal limits. Pt had prolonged mastication with trial of regular solid likely due to edentulous status. Small amounts of thin liquid residuals noted at the level of the UES intermittently which were cleared with subsequent swallows. Barium tablet cleared through esophagus. Pt noted to cough intermittently but this was not associated with visible penetration or aspiration on MBS. Given decreased respiratory status and lack of dentition, pt is at an increased risk of aspiration at this time. Recommend upgrading diet to dysphagia 3/ thin liquids, meds whole with liquid, intermittent supervision to cue small bites/ sips, ensuring pt seated upright. Reviewed findings and recommendations with pt who is in agreement. Will continue to follow for diet tolerance/ consider advancement.   Swallow Evaluation Recommendations       SLP Diet Recommendations: Dysphagia 3 (Mech soft) solids;Thin liquid   Liquid Administration via: Cup;Straw   Medication Administration: Whole meds with liquid   Supervision: Patient able to self feed;Intermittent supervision to cue for compensatory strategies   Compensations: Slow rate;Small sips/bites   Postural Changes: Seated upright at 90 degrees   Oral Care Recommendations: Oral care BID   Other Recommendations: Clarify dietary restrictions    Sanita Estrada Dionicia Abler, MA, CCC-SLP 03/02/2017,3:26 PM

## 2017-03-02 NOTE — Progress Notes (Signed)
PROGRESS NOTE    Edward [name]  BWG:[id number] DOB: [birth year] DOA: 02/25/2017 PCP: [name]    Brief Narrative:  82 year old Caucasian male with past medical history relevant for COPD/pulmonary fibrosis as well as history of paroxysmal atrial fibrillation, hypertension and diabetes who was admitted on 02/25/2017 with sepsis from pulmonary source presumed secondary to left-sided aspiration pneumonia in the setting of intractable emesis. On 02/25/2017 post admission patient became very tachycardic was found to be in A. fib with RVR, responded well to IV Cardizem drip which has since been discontinued. Patient remains in sinus rhythm at this time. 02/27/17- patient's went is afebrile RVR again, rates- 140-150s. Cardiology consulted and patient was transitioned to PO diltiazem.  CXR showing bilateral multifocal airspace consolidation with questionable new cavity and adenopathy.  CT chest with contrast follow up showing Extensive bilateral masslike consolidative opacities with associated mediastinal and hilar lymphadenopathy, likely progressed compared to chest radiograph performed 02/25/2017 and worrisome for progression of extensive multifocal infection. Further evaluation with short-term follow-up contrast-enhanced chest CT (approximately 4-6 weeks after treatment) is recommended to ensure resolution and exclude the presence of a discrete underlying pulmonary mass. Aortic Atherosclerosis and Emphysema.  Pulmonology consulted.   Assessment & Plan:   Principal Problem:   Sepsis ([place]) Active Problems:   Diabetes mellitus type 2, uncontrolled ([name])   COPD (chronic obstructive pulmonary disease) ([name])   Hypertension   Emesis, persistent   Constipation   Overflow diarrhea/Constipation   Rt Sided Aspiration pneumonia ([name])   Chronic diastolic heart failure ([name])   Elevated troponin   Sepsis Secondary to pulmonary source - continue IV Zosyn for presumed aspiration pneumonia - SLP  consulted for evaluation of possible aspiration - blood cultures showing NG x 4 days - CT scan on 5/30 showing Extensive bilateral masslike consolidative opacities with associated mediastinal and hilar lymphadenopathy, likely progressed compared to chest radiograph performed 02/25/2017 and worrisome for progression of extensive multifocal infection. Further evaluation with short-term follow-up contrast-enhanced chest CT (approximately 4-6 weeks after treatment) is recommended to ensure resolution and exclude the presence of a discrete underlying pulmonary mass.  Rt sided aspiration pneumonia - in the setting of intractable emesis - emesis has improved and is controlled and patient is tolerating PO - SLP consulted - continue IV Zosyn, bronchodilators, oxygen supplementation and mucolytics as ordered  Atrial fibrillation - cardiology following - patient went back into atrial fibrillation last night and was restarted on diltiazem drip - discussed with cardiology - continue eliquis  Uncontrolled DM - hemoglobin A1c of 13.3 - Novolog 70/30 of 18 units qAM and 13units qPM, moderate SSI - diabetes educator consulted  HFpEF - patient has history of chronic diastolic dysfunction CHF - monitor for signs of volume overload  COPD/Pulm Fibrosis - diffuse expiratory wheezing noted today on exam - bronchodilators and mucolytics as ordered - continue supplemental oxygen - pulmonology to see patient as outpatient   HTN - BP elevated currently  N/V/D - resolved  AKI - Cr of 1.08 yesterday  DVT prophylaxis: SCD/ subq heparin Code Status: Full code Family Communication: no family bedside though I did talk to patient's wife on the phone yesterday.   Disposition Plan: likely discharge in the next 24-48 hours if heart rate can stay controlled and breath status stabilizes   Consultants:   Cardiology  Diabetes education  CM  SLP  PT  Pulmonology  Procedures:    None  Antimicrobials:   Zosyn  Azithromycin    Subjective: Patient agitated this  am.  Says he wants to leave today because he cannot get sleep in the hospital, especially in the stepdown unit.  Upset that he was moved yesterday and that he had to come back to the SDU.    Objective: Vitals:   03/01/17 2037 03/01/17 2143 03/02/17 0238 03/02/17 0541  BP:  (!) 169/80  (!) 168/76  Pulse:  92  91  Resp:  (!) 22    Temp:  98.6 F (37 C)  97.6 F (36.4 C)  TempSrc:  Oral  Oral  SpO2: 94% 95% 92% 93%  Weight:      Height:        Intake/Output Summary (Last 24 hours) at 03/02/17 0827 Last data filed at 03/01/17 1852  Gross per 24 hour  Intake              900 ml  Output                0 ml  Net              900 ml   Filed Weights   02/25/17 1119 02/26/17 0400 02/28/17 0500  Weight: 72.6 kg (160 lb) 77 kg (169 lb 12.1 oz) 82 kg (180 lb 12.4 oz)    Examination:  General exam: Appears calm and comfortable  Respiratory system: crackles in right lung base, bilateral expiratory wheezes in middle and upper lung fields Cardiovascular system: S1 & S2 heard, irregularly irregular. No JVD, murmurs, rubs, gallops or clicks. No pedal edema. Gastrointestinal system: Abdomen is nondistended, soft and nontender. No organomegaly or masses felt. Normal bowel sounds heard. Central nervous system: No focal neurological deficits. Alert Extremities: Symmetric 5 x 5 power. Psychiatry:  agitated    Data Reviewed: I have personally reviewed following labs and imaging studies  CBC:  Recent Labs Lab 02/25/17 0847 02/26/17 0415 02/27/17 0418 02/28/17 0446  WBC 13.6* 14.5* 16.7* 13.9*  NEUTROABS 11.8*  --   --   --   HGB 11.4* 9.7* 10.0* 9.3*  HCT 35.7* 29.9* 31.7* 29.2*  MCV 80.8 79.1 80.3 79.6  PLT 322 341 375 096   Basic Metabolic Panel:  Recent Labs Lab 02/25/17 1440 02/26/17 1517 02/27/17 0418 02/28/17 0446 03/01/17 0509  NA 130* 134* 134* 133* 136  K 5.5* 4.9 4.4 4.4  4.4  CL 96* 101 102 101 101  CO2 24 25 23 22 27   GLUCOSE 470* 145* 127* 203* 174*  BUN 36* 37* 35* 38* 36*  CREATININE 1.46* 1.35* 1.21 1.23 1.08  CALCIUM 8.6* 8.2* 8.0* 8.0* 8.6*  MG  --  2.0  --   --   --    GFR: Estimated Creatinine Clearance: 55.8 mL/min (by C-G formula based on SCr of 1.08 mg/dL). Liver Function Tests:  Recent Labs Lab 02/25/17 0847 02/25/17 1128  AST 24 20  ALT 19 18  ALKPHOS 75 68  BILITOT 0.4 0.4  PROT 6.9 6.1*  ALBUMIN 3.3* 2.8*    Recent Labs Lab 02/25/17 0847  LIPASE 13   No results for input(s): AMMONIA in the last 168 hours. Coagulation Profile: No results for input(s): INR, PROTIME in the last 168 hours. Cardiac Enzymes:  Recent Labs Lab 02/25/17 0847 02/25/17 2140 02/26/17 0414 02/26/17 0931  TROPONINI <0.03 0.10* 0.23* 0.22*   BNP (last 3 results) No results for input(s): PROBNP in the last 8760 hours. HbA1C:  Recent Labs  02/28/17 0446  HGBA1C 13.3*   CBG:  Recent Labs Lab 02/28/17 2127 03/01/17  0719 03/01/17 1055 03/01/17 1704 03/01/17 2142  GLUCAP 247* 162* 214* 152* 152*   Lipid Profile: No results for input(s): CHOL, HDL, LDLCALC, TRIG, CHOLHDL, LDLDIRECT in the last 72 hours. Thyroid Function Tests: No results for input(s): TSH, T4TOTAL, FREET4, T3FREE, THYROIDAB in the last 72 hours. Anemia Panel: No results for input(s): VITAMINB12, FOLATE, FERRITIN, TIBC, IRON, RETICCTPCT in the last 72 hours. Sepsis Labs:  Recent Labs Lab 02/25/17 0857 02/25/17 2140 02/26/17 0039  LATICACIDVEN 2.43* 2.0* 1.3    Recent Results (from the past 240 hour(s))  Blood Culture (routine x 2)     Status: None   Collection Time: 02/25/17  8:58 AM  Result Value Ref Range Status   Specimen Description BLOOD LEFT HAND  Final   Special Requests   Final    BOTTLES DRAWN AEROBIC AND ANAEROBIC Blood Culture adequate volume   Culture NO GROWTH 5 DAYS  Final   Report Status 03/02/2017 FINAL  Final  Blood Culture (routine x  2)     Status: None   Collection Time: 02/25/17  9:05 AM  Result Value Ref Range Status   Specimen Description LEFT ANTECUBITAL  Final   Special Requests   Final    BOTTLES DRAWN AEROBIC AND ANAEROBIC Blood Culture adequate volume   Culture NO GROWTH 5 DAYS  Final   Report Status 03/02/2017 FINAL  Final  MRSA PCR Screening     Status: Abnormal   Collection Time: 02/25/17  8:22 PM  Result Value Ref Range Status   MRSA by PCR POSITIVE (A) NEGATIVE Final    Comment:        The GeneXpert MRSA Assay (FDA approved for NASAL specimens only), is one component of a comprehensive MRSA colonization surveillance program. It is not intended to diagnose MRSA infection nor to guide or monitor treatment for MRSA infections. RESULT CALLED TO, READ BACK BY AND VERIFIED WITH: KOGER,L @ 740 BY MATTHEWS,B 02/26/17          Radiology Studies: Ct Chest W Contrast  Result Date: 02/28/2017 CLINICAL DATA:  Admitted with pneumonia, shortness of breath and cough. EXAM: CT CHEST WITH CONTRAST TECHNIQUE: Multidetector CT imaging of the chest was performed during intravenous contrast administration. CONTRAST:  47mL ISOVUE-300 IOPAMIDOL (ISOVUE-300) INJECTION 61% COMPARISON:  Chest radiograph - 02/27/2017 ; 02/25/2017; 07/24/2016 FINDINGS: Cardiovascular: Borderline cardiomegaly. Coronary artery calcifications. No pericardial effusion. Mediastinum/Nodes: Mediastinal and hilar lymphadenopathy with index pretracheal lymph node measuring 1.2 cm in greatest short axis diameter (59, series 20), index high right paratracheal lymph node measuring 0.9 cm (image 34, series 2), index right infrahilar lymph node measuring 1.1 cm (image 87) and index left infrahilar lymph node measuring 1.2 cm (image 86). No axillary lymphadenopathy. Lungs/Pleura: Extensive bilateral masslike airspace opacities with index opacity with the right lower lobe measuring approximately 8.0 x 4.0 cm (image 97, series 4), index opacity within the right  upper lobe measuring approximately 6.0 x 5.2 cm and index opacity within the superior segment of the left lower lobe measuring approximately 6.2 x 3.2 cm (image 87, series 4). All these masslike opacities are associated with rather extensive adjacent ground-glass. Small right and trace left-sided pleural effusions. There is narrowing of the bilateral lower lobe bronchi however the bronchial airways appear patent. Advanced apical predominant mixed centrilobular and paraseptal emphysematous change. No pneumothorax. Upper Abdomen: Evaluation of the upper abdomen is degraded secondary to patient respiratory artifact. Potential ill-defined stranding about the imaged cranial aspect of the gallbladder, could be artifactual due to patient  motion. Atherosclerotic plaque with the abdominal aorta. The pancreas is largely fatty replaced. Musculoskeletal: No acute or aggressive osseous abnormalities. DDD within the image caudal aspect of the cervical spine. Degenerative change of the bilateral sternoclavicular joints. Regional soft tissues appear normal. Normal appearance of the thyroid gland. IMPRESSION: 1. Extensive bilateral masslike consolidative opacities with associated mediastinal and hilar lymphadenopathy, likely progressed compared to chest radiograph performed 02/25/2017 and worrisome for progression of extensive multifocal infection. Further evaluation with short-term follow-up contrast-enhanced chest CT (approximately 4-6 weeks after treatment) is recommended to ensure resolution and exclude the presence of a discrete underlying pulmonary mass. 2. Aortic Atherosclerosis (ICD10-I70.0) and Emphysema (ICD10-J43.9). Electronically Signed   By: Sandi Mariscal M.D.   On: 02/28/2017 10:16        Scheduled Meds: . apixaban  5 mg Oral BID  . Chlorhexidine Gluconate Cloth  6 each Topical Q0600  . guaiFENesin  600 mg Oral BID  . insulin aspart  0-15 Units Subcutaneous TID WC  . insulin aspart protamine- aspart  13  Units Subcutaneous Q supper  . insulin aspart protamine- aspart  18 Units Subcutaneous Q breakfast  . ipratropium  0.5 mg Nebulization Q6H  . levalbuterol  0.63 mg Nebulization Q6H  . mupirocin ointment  1 application Nasal BID  . polyethylene glycol  17 g Oral Daily  . predniSONE  40 mg Oral Q breakfast  . senna  1 tablet Oral BID   Continuous Infusions: . azithromycin Stopped (03/01/17 1852)  . diltiazem (CARDIZEM) infusion 10 mg/hr (03/02/17 0800)  . piperacillin-tazobactam (ZOSYN)  IV 3.375 g (03/02/17 0440)     LOS: 5 days    Time spent: 30 minutes    Loretha Stapler, MD [facility] Pager [phone]  If 7PM-7AM, please contact night-coverage [email or web address] Password [facility] 03/02/2017, 8:27 AM

## 2017-03-02 NOTE — Care Management (Signed)
    Durable Medical Equipment        Start     Ordered   03/02/17 1455  For home use only DME Walker rolling  Once    Question:  Patient needs a walker to treat with the following condition  Answer:  Weakness   03/02/17 1454

## 2017-03-02 NOTE — Care Management (Signed)
PT recommends HH PT and RW. Patient agreeable, offered choice of [place] agencies. Patient has no preferences. Romualdo Bolk of [facility] notified and will obtain orders from chart.

## 2017-03-02 NOTE — Progress Notes (Signed)
Subjective: He had done well and was transferred to the floor on telemetry when he developed rapid atrial fib this morning and he is back in stepdown and on a Cardizem drip. He says he doesn't feel very well. He is still coughing a lot. He has no other new complaints. No chest pain.  Objective: Vital signs in last 24 hours: Temp:  [97.6 F (36.4 C)-98.6 F (37 C)] 98.5 F (36.9 C) (06/01 0828) Pulse Rate:  [76-108] 91 (06/01 0541) Resp:  [21-22] 22 (05/31 2143) BP: (158-169)/(76-81) 168/76 (06/01 0541) SpO2:  [87 %-97 %] 87 % (06/01 0828) Weight:  [80.2 kg (176 lb 12.9 oz)] 80.2 kg (176 lb 12.9 oz) (06/01 0828) Weight change:  Last BM Date: 03/01/17  Intake/Output from previous day: 05/31 0701 - 06/01 0700 In: 950 [P.O.:600; IV Piggyback:350] Out: -   PHYSICAL EXAM General appearance: alert, cooperative and no distress Resp: rhonchi bilaterally Cardio: irregularly irregular rhythm GI: soft, non-tender; bowel sounds normal; no masses,  no organomegaly Extremities: extremities normal, atraumatic, no cyanosis or edema His mucous membranes in his mouth are somewhat dry  Lab Results:  Results for orders placed or performed during the hospital encounter of 02/25/17 (from the past 48 hour(s))  Glucose, capillary     Status: Abnormal   Collection Time: 02/28/17 11:31 AM  Result Value Ref Range   Glucose-Capillary 366 (H) 65 - 99 mg/dL  Glucose, capillary     Status: Abnormal   Collection Time: 02/28/17  4:26 PM  Result Value Ref Range   Glucose-Capillary 309 (H) 65 - 99 mg/dL   Comment 1 Notify RN    Comment 2 Document in Chart   Glucose, capillary     Status: Abnormal   Collection Time: 02/28/17  9:27 PM  Result Value Ref Range   Glucose-Capillary 247 (H) 65 - 99 mg/dL   Comment 1 Notify RN   Basic metabolic panel     Status: Abnormal   Collection Time: 03/01/17  5:09 AM  Result Value Ref Range   Sodium 136 135 - 145 mmol/L   Potassium 4.4 3.5 - 5.1 mmol/L   Chloride 101  101 - 111 mmol/L   CO2 27 22 - 32 mmol/L   Glucose, Bld 174 (H) 65 - 99 mg/dL   BUN 36 (H) 6 - 20 mg/dL   Creatinine, Ser 1.08 0.61 - 1.24 mg/dL   Calcium 8.6 (L) 8.9 - 10.3 mg/dL   GFR calc non Af Amer >60 >60 mL/min   GFR calc Af Amer >60 >60 mL/min    Comment: (NOTE) The eGFR has been calculated using the CKD EPI equation. This calculation has not been validated in all clinical situations. eGFR's persistently <60 mL/min signify possible Chronic Kidney Disease.    Anion gap 8 5 - 15  Glucose, capillary     Status: Abnormal   Collection Time: 03/01/17  7:19 AM  Result Value Ref Range   Glucose-Capillary 162 (H) 65 - 99 mg/dL  Glucose, capillary     Status: Abnormal   Collection Time: 03/01/17 10:55 AM  Result Value Ref Range   Glucose-Capillary 214 (H) 65 - 99 mg/dL  Glucose, capillary     Status: Abnormal   Collection Time: 03/01/17  5:04 PM  Result Value Ref Range   Glucose-Capillary 152 (H) 65 - 99 mg/dL  Glucose, capillary     Status: Abnormal   Collection Time: 03/01/17  9:42 PM  Result Value Ref Range   Glucose-Capillary 152 (H) 65 -  99 mg/dL   Comment 1 Notify RN    Comment 2 Document in Chart   Glucose, capillary     Status: Abnormal   Collection Time: 03/02/17  8:27 AM  Result Value Ref Range   Glucose-Capillary 119 (H) 65 - 99 mg/dL    ABGS No results for input(s): PHART, PO2ART, TCO2, HCO3 in the last 72 hours.  Invalid input(s): PCO2 CULTURES Recent Results (from the past 240 hour(s))  Blood Culture (routine x 2)     Status: None   Collection Time: 02/25/17  8:58 AM  Result Value Ref Range Status   Specimen Description BLOOD LEFT HAND  Final   Special Requests   Final    BOTTLES DRAWN AEROBIC AND ANAEROBIC Blood Culture adequate volume   Culture NO GROWTH 5 DAYS  Final   Report Status 03/02/2017 FINAL  Final  Blood Culture (routine x 2)     Status: None   Collection Time: 02/25/17  9:05 AM  Result Value Ref Range Status   Specimen Description LEFT  ANTECUBITAL  Final   Special Requests   Final    BOTTLES DRAWN AEROBIC AND ANAEROBIC Blood Culture adequate volume   Culture NO GROWTH 5 DAYS  Final   Report Status 03/02/2017 FINAL  Final  MRSA PCR Screening     Status: Abnormal   Collection Time: 02/25/17  8:22 PM  Result Value Ref Range Status   MRSA by PCR POSITIVE (A) NEGATIVE Final    Comment:        The GeneXpert MRSA Assay (FDA approved for NASAL specimens only), is one component of a comprehensive MRSA colonization surveillance program. It is not intended to diagnose MRSA infection nor to guide or monitor treatment for MRSA infections. RESULT CALLED TO, READ BACK BY AND VERIFIED WITH: KOGER,L @ 408 BY MATTHEWS,B 02/26/17    Studies/Results: Ct Chest W Contrast  Result Date: 02/28/2017 CLINICAL DATA:  Admitted with pneumonia, shortness of breath and cough. EXAM: CT CHEST WITH CONTRAST TECHNIQUE: Multidetector CT imaging of the chest was performed during intravenous contrast administration. CONTRAST:  23m ISOVUE-300 IOPAMIDOL (ISOVUE-300) INJECTION 61% COMPARISON:  Chest radiograph - 02/27/2017 ; 02/25/2017; 07/24/2016 FINDINGS: Cardiovascular: Borderline cardiomegaly. Coronary artery calcifications. No pericardial effusion. Mediastinum/Nodes: Mediastinal and hilar lymphadenopathy with index pretracheal lymph node measuring 1.2 cm in greatest short axis diameter (59, series 20), index high right paratracheal lymph node measuring 0.9 cm (image 34, series 2), index right infrahilar lymph node measuring 1.1 cm (image 87) and index left infrahilar lymph node measuring 1.2 cm (image 86). No axillary lymphadenopathy. Lungs/Pleura: Extensive bilateral masslike airspace opacities with index opacity with the right lower lobe measuring approximately 8.0 x 4.0 cm (image 97, series 4), index opacity within the right upper lobe measuring approximately 6.0 x 5.2 cm and index opacity within the superior segment of the left lower lobe measuring  approximately 6.2 x 3.2 cm (image 87, series 4). All these masslike opacities are associated with rather extensive adjacent ground-glass. Small right and trace left-sided pleural effusions. There is narrowing of the bilateral lower lobe bronchi however the bronchial airways appear patent. Advanced apical predominant mixed centrilobular and paraseptal emphysematous change. No pneumothorax. Upper Abdomen: Evaluation of the upper abdomen is degraded secondary to patient respiratory artifact. Potential ill-defined stranding about the imaged cranial aspect of the gallbladder, could be artifactual due to patient motion. Atherosclerotic plaque with the abdominal aorta. The pancreas is largely fatty replaced. Musculoskeletal: No acute or aggressive osseous abnormalities. DDD within the image  caudal aspect of the cervical spine. Degenerative change of the bilateral sternoclavicular joints. Regional soft tissues appear normal. Normal appearance of the thyroid gland. IMPRESSION: 1. Extensive bilateral masslike consolidative opacities with associated mediastinal and hilar lymphadenopathy, likely progressed compared to chest radiograph performed 02/25/2017 and worrisome for progression of extensive multifocal infection. Further evaluation with short-term follow-up contrast-enhanced chest CT (approximately 4-6 weeks after treatment) is recommended to ensure resolution and exclude the presence of a discrete underlying pulmonary mass. 2. Aortic Atherosclerosis (ICD10-I70.0) and Emphysema (ICD10-J43.9). Electronically Signed   By: Sandi Mariscal M.D.   On: 02/28/2017 10:16    Medications:  Prior to Admission:  Prescriptions Prior to Admission  Medication Sig Dispense Refill Last Dose  . albuterol (PROVENTIL) (2.5 MG/3ML) 0.083% nebulizer solution INHALE 1 VIAL VIA NEBULIZER EVERY 6 HOURS AS NEEDED FOR WHEEZING OR SHORTNESS OF BREATH 360 mL 5 Taking  . aspirin 81 MG EC tablet TAKE 1 TABLET BY MOUTH ONCE DAILY 30 tablet 3 Taking   . budesonide (PULMICORT) 0.5 MG/2ML nebulizer solution Take 2 mLs (0.5 mg total) by nebulization 2 (two) times daily. 120 mL 5 Taking  . furosemide (LASIX) 20 MG tablet Take 2 each morning. 60 tablet 3 Taking  . Insulin Detemir (LEVEMIR FLEXTOUCH) 100 UNIT/ML Pen 70 units at bedtime 15 mL 11 Taking  . ipratropium (ATROVENT) 0.02 % nebulizer solution Take 2.5 mLs (0.5 mg total) by nebulization 4 (four) times daily. 25 mL 12 Taking  . losartan (COZAAR) 100 MG tablet Take 1 tablet (100 mg total) by mouth daily. 30 tablet 5 Taking  . metFORMIN (GLUCOPHAGE) 1000 MG tablet Take 1 tablet (1,000 mg total) by mouth 2 (two) times daily. 60 tablet 5 Taking  . omeprazole (PRILOSEC) 20 MG capsule Take 1 capsule (20 mg total) by mouth daily. 30 capsule 3 Taking  . ONE TOUCH ULTRA TEST test strip CHECK FASTING BLOOD SUGAR TWICE DAILY 100 each 4 Taking  . ONE TOUCH ULTRA TEST test strip USE TO CHECK BLOOD SUGAR EACH MORNING AND THEN 2 HOURS AFTER ANY MEAL 100 each 2 Taking  . potassium chloride SA (K-DUR,KLOR-CON) 20 MEQ tablet Take 1 tablet (20 mEq total) by mouth daily. 30 tablet 3 Taking  . pravastatin (PRAVACHOL) 80 MG tablet Take 1 tablet (80 mg total) by mouth at bedtime. 30 tablet 5 Taking  . PROAIR RESPICLICK 480 (90 Base) MCG/ACT AEPB Inhale 108 mcg into the lungs 3 (three) times daily.   Taking  . Tiotropium Bromide-Olodaterol (STIOLTO RESPIMAT) 2.5-2.5 MCG/ACT AERS Inhale 2 Inhalers into the lungs daily. 1 Inhaler 5 Taking   Scheduled: . apixaban  5 mg Oral BID  . Chlorhexidine Gluconate Cloth  6 each Topical Q0600  . diltiazem  15 mg Intravenous Once  . diltiazem  60 mg Oral QID  . guaiFENesin  600 mg Oral BID  . insulin aspart  0-15 Units Subcutaneous TID WC  . insulin aspart protamine- aspart  13 Units Subcutaneous Q supper  . insulin aspart protamine- aspart  18 Units Subcutaneous Q breakfast  . ipratropium  0.5 mg Nebulization Q6H  . levalbuterol  0.63 mg Nebulization Q6H  . mupirocin  ointment  1 application Nasal BID  . polyethylene glycol  17 g Oral Daily  . predniSONE  40 mg Oral Q breakfast  . senna  1 tablet Oral BID   Continuous: . azithromycin Stopped (03/01/17 1852)  . diltiazem (CARDIZEM) infusion 10 mg/hr (03/02/17 0800)  . piperacillin-tazobactam (ZOSYN)  IV Stopped (03/02/17 1655)  BJX:FFKVQOHCOBTVM **OR** acetaminophen, alum & mag hydroxide-simeth, guaiFENesin, levalbuterol, ondansetron **OR** ondansetron (ZOFRAN) IV, polyethylene glycol, traZODone  Assesment: He was admitted with sepsis and pneumonia. This is almost certainly from aspiration. He is being treated appropriately for that. He developed atrial fibrillation rapid ventricular response this morning and he is back in stepdown for that to be treated. He seems to be doing better as far as his pneumonia and COPD exacerbation are concerned. Principal Problem:   Sepsis ([place]) Active Problems:   Diabetes mellitus type 2, uncontrolled ([name])   COPD (chronic obstructive pulmonary disease) ([name])   Hypertension   Emesis, persistent   Constipation   Overflow diarrhea/Constipation   Rt Sided Aspiration pneumonia ([name])   Chronic diastolic heart failure ([name])   Elevated troponin   Atrial fibrillation with rapid ventricular response ([place])    Plan: Continue current treatments    LOS: 5 days   Amiliah Campisi L 03/02/2017, 8:58 AM

## 2017-03-02 NOTE — Progress Notes (Signed)
Pt having episodes of SVT of tele. When assessed pt was coughing and sitting up in bed, denies any chest pain or extreme SOB. Has some SOB with exertion and vigorous coughing. O2 sats are 93% on 4L, will continue to monitor.

## 2017-03-02 NOTE — Progress Notes (Signed)
At 0700 report given to [facility] to in ICU, pt transferred via wheelchair to unit.

## 2017-03-02 NOTE — Evaluation (Signed)
Clinical/Bedside Swallow Evaluation Patient Details  Name: Edward [name] MRN: [id number] Date of Birth: 1934-10-16  Today's Date: 03/02/2017 Time: SLP Start Time (ACUTE ONLY): 0905 SLP Stop Time (ACUTE ONLY): 0926 SLP Time Calculation (min) (ACUTE ONLY): 21 min  Past Medical History:  Past Medical History:  Diagnosis Date  . Allergy    Rhinitis  . Bronchitis   . Chronic respiratory failure ([place])   . Colon polyps   . COPD (chronic obstructive pulmonary disease) ([place])   . Diabetes mellitus   . Elevated lipids   . Hypercholesterolemia   . Hypertension   . On home O2    2L N/C   . PSA elevation   . Pulmonary fibrosis ([place])   . Vitamin D deficiency    Past Surgical History:  Past Surgical History:  Procedure Laterality Date  . CATARACT EXTRACTION W/PHACO  06/25/2012   Procedure: CATARACT EXTRACTION PHACO AND INTRAOCULAR LENS PLACEMENT (IOC);  Surgeon: Elta Guadeloupe T. Gershon Crane, MD;  Location: [facility];  Service: Ophthalmology;  Laterality: Left;  CDE=19.01  . CATARACT EXTRACTION W/PHACO  07/09/2012   Procedure: CATARACT EXTRACTION PHACO AND INTRAOCULAR LENS PLACEMENT (IOC);  Surgeon: Elta Guadeloupe T. Gershon Crane, MD;  Location: [facility];  Service: Ophthalmology;  Laterality: Right;  CDE: 20.58   HPI:  82 year old Caucasian male with past medical history relevant for COPD/pulmonary fibrosis as well as history of paroxysmal atrial fibrillation, hypertension and diabetes who was admitted on 02/25/2017 with sepsis from pulmonary source presumed secondary to left-sided aspiration pneumonia in the setting of intractable emesis. On 02/25/2017 post admission patient became very tachycardic was found to be in A. fib with RVR, responded well to IV Cardizem drip which has since been discontinued. Patient remains in sinus rhythm at this time. 02/27/17- patient's went is afebrile RVR again, rates- 140-150s. Cardiology consulted and patient was transitioned to PO diltiazem.  CXR showing bilateral multifocal airspace consolidation  with questionable new cavity and adenopathy.  CT chest with contrast follow up showing Extensive bilateral masslike consolidative opacities with associated mediastinal and hilar lymphadenopathy, likely progressed compared to chest radiograph performed 02/25/2017 and worrisome for progression of extensive multifocal infection. Further evaluation with short-term follow-up contrast-enhanced chest CT (approximately   Assessment / Plan / Recommendation Clinical Impression  Pt denied difficulty swallowing, but did endorse "getting strangled on just about anything". Oral cavity was noted to have thick, ropey secretions. Oral care completed. Pt states that he never uses mouthwash or toothpaste at home. SLP provided education on the importance of oral care to help minimize risks of aspiration if occurring.  Initially no overt signs of symptoms of aspiration noted, however when Pt allowed to self feed AM meal, Pt with episode of prolonged coughing following graham crackers. He was noted to be impulsive with intake and continued to place food in mouth during coughing events. Suspect that Pt may have episodic aspiration events in setting of COPD and edentulous status. Will downgrade diet to D2/chopped and continue thin liquids. Pt further educated on aspiration and reflux precautions. Pt would benefit from MBSS to objectively evaluate swallow. Will try to arrange for later today. Above to RN. OK for PO meds whole with water when alert and upright. SLP Visit Diagnosis: Dysphagia, oropharyngeal phase (R13.12)    Aspiration Risk  Mild aspiration risk;Moderate aspiration risk    Diet Recommendation Dysphagia 2 (Fine chop);Thin liquid   Liquid Administration via: Cup Medication Administration: Whole meds with liquid Supervision: Patient able to self feed;Intermittent supervision to cue for compensatory strategies Compensations:  Slow rate Postural Changes: Seated upright at 90 degrees;Remain upright for at least 30  minutes after po intake    Other  Recommendations Oral Care Recommendations: Oral care BID;Staff/trained caregiver to provide oral care Other Recommendations: Clarify dietary restrictions   Follow up Recommendations  (pending)      Frequency and Duration min 2x/week  1 week       Prognosis Prognosis for Safe Diet Advancement: Fair Barriers to Reach Goals: Motivation Barriers/Prognosis Comment: Pt reports NO oral care at home      Swallow Study   General Date of Onset: 03/28/17 HPI: 82 year old Caucasian male with past medical history relevant for COPD/pulmonary fibrosis as well as history of paroxysmal atrial fibrillation, hypertension and diabetes who was admitted on 02/25/2017 with sepsis from pulmonary source presumed secondary to left-sided aspiration pneumonia in the setting of intractable emesis. On 02/25/2017 post admission patient became very tachycardic was found to be in A. fib with RVR, responded well to IV Cardizem drip which has since been discontinued. Patient remains in sinus rhythm at this time. 02/27/17- patient's went is afebrile RVR again, rates- 140-150s. Cardiology consulted and patient was transitioned to PO diltiazem.  CXR showing bilateral multifocal airspace consolidation with questionable new cavity and adenopathy.  CT chest with contrast follow up showing Extensive bilateral masslike consolidative opacities with associated mediastinal and hilar lymphadenopathy, likely progressed compared to chest radiograph performed 02/25/2017 and worrisome for progression of extensive multifocal infection. Further evaluation with short-term follow-up contrast-enhanced chest CT (approximately Type of Study: Bedside Swallow Evaluation Previous Swallow Assessment: none on record Diet Prior to this Study: Regular;Thin liquids Temperature Spikes Noted: No Respiratory Status: Nasal cannula History of Recent Intubation: No Behavior/Cognition: Alert;Cooperative;Pleasant mood Oral  Cavity Assessment: Dried secretions;Excessive secretions Oral Care Completed by SLP: Yes Oral Cavity - Dentition: Edentulous Vision: Functional for self-feeding Self-Feeding Abilities: Able to feed self Patient Positioning: Upright in bed Baseline Vocal Quality: Normal Volitional Cough: Strong Volitional Swallow: Able to elicit    Oral/Motor/Sensory Function Overall Oral Motor/Sensory Function: Within functional limits   Ice Chips Ice chips: Within functional limits Presentation: Spoon   Thin Liquid Thin Liquid: Within functional limits Presentation: Cup;Self Fed;Straw    Nectar Thick Nectar Thick Liquid: Not tested   Honey Thick Honey Thick Liquid: Not tested   Puree Puree: Within functional limits Presentation: Self Fed;Spoon   Solid   GO   Solid: Impaired Presentation: Self Fed Oral Phase Impairments: Impaired mastication Oral Phase Functional Implications: Impaired mastication Pharyngeal Phase Impairments: Cough - Delayed Other Comments:  (impulsivity noted with intake)       Thank you,  Genene Churn, [place]   Chiffon Kittleson 03/02/2017,9:27 AM

## 2017-03-02 NOTE — Progress Notes (Signed)
Progress Note  Patient Name: Edward [name] Date of Encounter: 03/02/2017   Subjective   Continued SOB  Inpatient Medications    Scheduled Meds: . apixaban  5 mg Oral BID  . Chlorhexidine Gluconate Cloth  6 each Topical Q0600  . guaiFENesin  600 mg Oral BID  . insulin aspart  0-15 Units Subcutaneous TID WC  . insulin aspart protamine- aspart  13 Units Subcutaneous Q supper  . insulin aspart protamine- aspart  18 Units Subcutaneous Q breakfast  . ipratropium  0.5 mg Nebulization Q6H  . levalbuterol  0.63 mg Nebulization Q6H  . mupirocin ointment  1 application Nasal BID  . polyethylene glycol  17 g Oral Daily  . predniSONE  40 mg Oral Q breakfast  . senna  1 tablet Oral BID   Continuous Infusions: . azithromycin Stopped (03/01/17 1852)  . diltiazem (CARDIZEM) infusion 10 mg/hr (03/02/17 0800)  . piperacillin-tazobactam (ZOSYN)  IV Stopped (03/02/17 0843)   PRN Meds: acetaminophen **OR** acetaminophen, alum & mag hydroxide-simeth, guaiFENesin, levalbuterol, ondansetron **OR** ondansetron (ZOFRAN) IV, polyethylene glycol, traZODone   Vital Signs    Vitals:   03/01/17 2143 03/02/17 0238 03/02/17 0541 03/02/17 0828  BP: (!) 169/80  (!) 168/76   Pulse: 92  91   Resp: (!) 22     Temp: 98.6 F (37 C)  97.6 F (36.4 C) 98.5 F (36.9 C)  TempSrc: Oral  Oral Oral  SpO2: 95% 92% 93% (!) 87%  Weight:    176 lb 12.9 oz (80.2 kg)  Height:    5\' 6"  (1.676 m)    Intake/Output Summary (Last 24 hours) at 03/02/17 0843 Last data filed at 03/02/17 0828  Gross per 24 hour  Intake              955 ml  Output                0 ml  Net              955 ml   Filed Weights   02/26/17 0400 02/28/17 0500 03/02/17 0828  Weight: 169 lb 12.1 oz (77 kg) 180 lb 12.4 oz (82 kg) 176 lb 12.9 oz (80.2 kg)    Telemetry    afib RVR - Personally Reviewed  ECG    n/a  Physical Exam   GEN: No acute distress.   Neck: No JVD Cardiac: irreg, tachy 160, no m/r/g Respiratory: bilateral  wheezing GI: Soft, nontender, non-distended  MS: No edema; No deformity. Neuro:  Nonfocal  Psych: Normal affect   Labs    Chemistry Recent Labs Lab 02/25/17 0847 02/25/17 1128  02/27/17 0418 02/28/17 0446 03/01/17 0509  NA 120* 125*  < > 134* 133* 136  K 5.8* 5.4*  < > 4.4 4.4 4.4  CL 85* 93*  < > 102 101 101  CO2 29 23  < > 23 22 27   GLUCOSE 752* 614*  < > 127* 203* 174*  BUN 40* 38*  < > 35* 38* 36*  CREATININE 1.54* 1.37*  < > 1.21 1.23 1.08  CALCIUM 8.9 8.2*  < > 8.0* 8.0* 8.6*  PROT 6.9 6.1*  --   --   --   --   ALBUMIN 3.3* 2.8*  --   --   --   --   AST 24 20  --   --   --   --   ALT 19 18  --   --   --   --  ALKPHOS 75 68  --   --   --   --   BILITOT 0.4 0.4  --   --   --   --   GFRNONAA 41* 47*  < > 55* 54* >60  GFRAA 48* 55*  < > >60 >60 >60  ANIONGAP 6 9  < > 9 10 8   < > = values in this interval not displayed.   Hematology Recent Labs Lab 02/26/17 0415 02/27/17 0418 02/28/17 0446  WBC 14.5* 16.7* 13.9*  RBC 3.78* 3.95* 3.67*  HGB 9.7* 10.0* 9.3*  HCT 29.9* 31.7* 29.2*  MCV 79.1 80.3 79.6  MCH 25.7* 25.3* 25.3*  MCHC 32.4 31.5 31.8  RDW 16.8* 17.3* 17.6*  PLT 341 375 342    Cardiac Enzymes Recent Labs Lab 02/25/17 0847 02/25/17 2140 02/26/17 0414 02/26/17 0931  TROPONINI <0.03 0.10* 0.23* 0.22*   No results for input(s): TROPIPOC in the last 168 hours.   BNPNo results for input(s): BNP, PROBNP in the last 168 hours.   DDimer No results for input(s): DDIMER in the last 168 hours.   Radiology    Ct Chest W Contrast  Result Date: 02/28/2017 CLINICAL DATA:  Admitted with pneumonia, shortness of breath and cough. EXAM: CT CHEST WITH CONTRAST TECHNIQUE: Multidetector CT imaging of the chest was performed during intravenous contrast administration. CONTRAST:  27mL ISOVUE-300 IOPAMIDOL (ISOVUE-300) INJECTION 61% COMPARISON:  Chest radiograph - 02/27/2017 ; 02/25/2017; 07/24/2016 FINDINGS: Cardiovascular: Borderline cardiomegaly. Coronary artery  calcifications. No pericardial effusion. Mediastinum/Nodes: Mediastinal and hilar lymphadenopathy with index pretracheal lymph node measuring 1.2 cm in greatest short axis diameter (59, series 20), index high right paratracheal lymph node measuring 0.9 cm (image 34, series 2), index right infrahilar lymph node measuring 1.1 cm (image 87) and index left infrahilar lymph node measuring 1.2 cm (image 86). No axillary lymphadenopathy. Lungs/Pleura: Extensive bilateral masslike airspace opacities with index opacity with the right lower lobe measuring approximately 8.0 x 4.0 cm (image 97, series 4), index opacity within the right upper lobe measuring approximately 6.0 x 5.2 cm and index opacity within the superior segment of the left lower lobe measuring approximately 6.2 x 3.2 cm (image 87, series 4). All these masslike opacities are associated with rather extensive adjacent ground-glass. Small right and trace left-sided pleural effusions. There is narrowing of the bilateral lower lobe bronchi however the bronchial airways appear patent. Advanced apical predominant mixed centrilobular and paraseptal emphysematous change. No pneumothorax. Upper Abdomen: Evaluation of the upper abdomen is degraded secondary to patient respiratory artifact. Potential ill-defined stranding about the imaged cranial aspect of the gallbladder, could be artifactual due to patient motion. Atherosclerotic plaque with the abdominal aorta. The pancreas is largely fatty replaced. Musculoskeletal: No acute or aggressive osseous abnormalities. DDD within the image caudal aspect of the cervical spine. Degenerative change of the bilateral sternoclavicular joints. Regional soft tissues appear normal. Normal appearance of the thyroid gland. IMPRESSION: 1. Extensive bilateral masslike consolidative opacities with associated mediastinal and hilar lymphadenopathy, likely progressed compared to chest radiograph performed 02/25/2017 and worrisome for  progression of extensive multifocal infection. Further evaluation with short-term follow-up contrast-enhanced chest CT (approximately 4-6 weeks after treatment) is recommended to ensure resolution and exclude the presence of a discrete underlying pulmonary mass. 2. Aortic Atherosclerosis (ICD10-I70.0) and Emphysema (ICD10-J43.9). Electronically Signed   By: Sandi Mariscal M.D.   On: 02/28/2017 10:16    Cardiac Studies    Patient Profile     82 y.o.malemalehistory of severe COPD, DM2, HTN, HL,  chronic diastolic HF, PAF admitted with sepsis and aspiration pneumonia. Cardiology is consulted for transient afib, chronic diastolic HF, and elevated troponin. Patient denies any chest pain or palpitations.   Assessment & Plan    1. Elevated troponin - mild elevation in setting of sepsis and tachycardia, trending down. Echo with normal LVEF - prior stress test with inferior infarct with very mild ischemia - suspect demand ischemia in setting of probable chronic occlusive CAD, as opposed to acute occlusive disease/ACS - no plans for ischemic testing at this time   2. Chronic diastolic HF - - echo with normal LVEF, grade II diastolic dysfunction - CXR with primarily pneumonia - appears euvolemic on exam, would not diurese at this time.   3.  Afib - afib in setting of systemic illness with pneumonia and COPD exacerbation and multiple electrolyte abnormalities. Initial episode short in duration, however multiple recurrent episodes this admission of longer duration - rate controlled with oral dilt. Rates back up again yesterday. Started on dilt gtt, oral dilt stopped.  -  CHADS2Vasc score is 5, with recurrent afib episodes this admit started on anticoag. May be worth reevaluating over time whether contiued anticoag is indicated, afib may be isolated in this setting of severe respiraotry infection  - severe ongoing drive for tachycardia and afib given his ongoing severe pneumonia and COPD  exacerbation - will continue to work toward rate control, he has plenty of room with bp. Will bolus 15mg  of dilt, continue drip. Start short acting dilt 60mg  qid, wean dilt drip as tolerated - with lung history would try to avoid amio if possible, if neccesary could consider short course while pneumonia/COPD exacerbation resolve. Could also consider digoxin load. At this time would focus on rate control with dilt as bp tolerates.    4. COPD/Pulm fibrosis - management per primary team  5. Pneumonia - management per primary team - 02/28/17 CT chest extensive bilateral masslike consolidative opacities   Signed, Carlyle Dolly, MD  03/02/2017, 8:43 AM

## 2017-03-02 NOTE — Progress Notes (Signed)
Called by RN the patient heart rate 160s on telemetry. Obtained EKG which shows A. fib with RVR. We'll start Cardizem load and infusion 10 mg IV 1 followed by 5-15 mg per hour. Will transfer to stepdown unit. Will discontinue oral Cardizem. Cardiology is following the patient in the hospital.

## 2017-03-03 LAB — GLUCOSE, CAPILLARY
GLUCOSE-CAPILLARY: 135 mg/dL — AB (ref 65–99)
Glucose-Capillary: 101 mg/dL — ABNORMAL HIGH (ref 65–99)

## 2017-03-03 MED ORDER — AZITHROMYCIN 250 MG PO TABS
500.0000 mg | ORAL_TABLET | Freq: Every day | ORAL | Status: DC
  Administered 2017-03-03: 500 mg via ORAL
  Filled 2017-03-03: qty 2

## 2017-03-03 MED ORDER — AMOXICILLIN-POT CLAVULANATE 875-125 MG PO TABS
1.0000 | ORAL_TABLET | Freq: Two times a day (BID) | ORAL | Status: DC
  Administered 2017-03-03: 1 via ORAL
  Filled 2017-03-03 (×5): qty 1

## 2017-03-03 MED ORDER — AMOXICILLIN-POT CLAVULANATE 875-125 MG PO TABS: 1.0000 | ORAL_TABLET | Freq: Two times a day (BID) | ORAL | 0 refills | Status: DC

## 2017-03-03 MED ORDER — APIXABAN 5 MG PO TABS: 5.0000 mg | ORAL_TABLET | Freq: Two times a day (BID) | ORAL | 0 refills | Status: DC

## 2017-03-03 MED ORDER — DILTIAZEM HCL ER COATED BEADS 240 MG PO CP24: 240.0000 mg | ORAL_CAPSULE | Freq: Every day | ORAL | 0 refills | Status: DC

## 2017-03-03 MED ORDER — INSULIN ASPART PROT & ASPART (70-30 MIX) 100 UNIT/ML ~~LOC~~ SUSP: 13.0000 [IU] | Freq: Every day | SUBCUTANEOUS | 11 refills | Status: DC

## 2017-03-03 MED ORDER — INSULIN ASPART PROT & ASPART (70-30 MIX) 100 UNIT/ML ~~LOC~~ SUSP: 18.0000 [IU] | Freq: Every day | SUBCUTANEOUS | 11 refills | Status: DC

## 2017-03-03 MED ORDER — AZITHROMYCIN 250 MG PO TABS: 500.0000 mg | ORAL_TABLET | Freq: Every day | ORAL | 0 refills | Status: DC

## 2017-03-03 MED ORDER — DILTIAZEM HCL ER COATED BEADS 240 MG PO CP24
240.0000 mg | ORAL_CAPSULE | Freq: Every day | ORAL | Status: DC
Start: 2017-03-03 — End: 2017-03-03
  Administered 2017-03-03: 240 mg via ORAL
  Filled 2017-03-03: qty 1

## 2017-03-03 MED ORDER — LOSARTAN POTASSIUM 100 MG PO TABS: 50.0000 mg | ORAL_TABLET | Freq: Every day | ORAL | 5 refills | Status: DC

## 2017-03-03 MED ORDER — INSULIN NPH (HUMAN) (ISOPHANE) 100 UNIT/ML ~~LOC~~ SUSP: SUBCUTANEOUS | 0 refills | Status: DC

## 2017-03-03 NOTE — Progress Notes (Signed)
PROGRESS NOTE    Edward [name]  OZH:[id number] DOB: 1935-01-23 DOA: 02/25/2017 PCP: [name]    Brief Narrative:  82 year old Caucasian male with past medical history relevant for COPD/pulmonary fibrosis as well as history of paroxysmal atrial fibrillation, hypertension and diabetes who was admitted on 02/25/2017 with sepsis from pulmonary source presumed secondary to left-sided aspiration pneumonia in the setting of intractable emesis. On 02/25/2017 post admission patient became very tachycardic was found to be in A. fib with RVR, responded well to IV Cardizem drip which has since been discontinued. Patient remains in sinus rhythm at this time. 02/27/17- patient's went is afebrile RVR again, rates- 140-150s. Cardiology consulted and patient was transitioned to PO diltiazem.  CXR showing bilateral multifocal airspace consolidation with questionable new cavity and adenopathy.  CT chest with contrast follow up showing Extensive bilateral masslike consolidative opacities with associated mediastinal and hilar lymphadenopathy, likely progressed compared to chest radiograph performed 02/25/2017 and worrisome for progression of extensive multifocal infection. Further evaluation with short-term follow-up contrast-enhanced chest CT (approximately 4-6 weeks after treatment) is recommended to ensure resolution and exclude the presence of a discrete underlying pulmonary mass. Aortic Atherosclerosis and Emphysema.  Pulmonology consulted.  Patient went back into atrial fibrillation in the am of 6/1 and was restarted on cardizem drip.  His PO cardizem was increased and the drip was titrated down.  On the am of 03/03/17 he was placed on a long acting cardizem.  He was seen by SLP and underwent a barium swallow evaluation which showed no signs of aspiration but coughing with regular solid foods.    Assessment & Plan:   Principal Problem:   Sepsis ([place]) Active Problems:   Diabetes mellitus type 2,  uncontrolled ([name])   COPD (chronic obstructive pulmonary disease) ([name])   Hypertension   Emesis, persistent   Constipation   Overflow diarrhea/Constipation   Rt Sided Aspiration pneumonia ([name])   Chronic diastolic heart failure ([name])   Elevated troponin   Atrial fibrillation with rapid ventricular response ([name])   Sepsis Secondary to pulmonary source - transition from Zosyn to augmentin today - 2 more days of azithromycin and augmentin - SLP recommending dysphagia 3 diet - blood cultures showing NG x 5 days - CT scan on 5/30 showing Extensive bilateral masslike consolidative opacities with associated mediastinal and hilar lymphadenopathy, likely progressed compared to chest radiograph performed 02/25/2017 and worrisome for progression of extensive multifocal infection. Further evaluation with short-term follow-up contrast-enhanced chest CT (approximately 4-6 weeks after treatment) is recommended to ensure resolution and exclude the presence of a discrete underlying pulmonary mass - will need SLP and pulmonology f/u outpt  Rt sided aspiration pneumonia - in the setting of intractable emesis - emesis has improved and is controlled and patient is tolerating PO - SLP consulted - augmentin today  Atrial fibrillation - cardiology following - cardizem drip titrated down and patient on 2.5 this am - will start on 240mg  daily - patient adamant about leaving today - continue eliquis  Uncontrolled DM - hemoglobin A1c of 13.3 - Novolog 70/30 of 18 units qAM and 13units qPM, moderate SSI - diabetes educator consulted  HFpEF - patient has history of chronic diastolic dysfunction CHF - continues to remain euvolemic  COPD/Pulm Fibrosis - bronchodilators and mucolytics as ordered - continue supplemental oxygen - pulmonology following  HTN - BP elevated currently  N/V/D - resolved  AKI - Cr of 1.08 yesterday  DVT prophylaxis: SCD/ subq heparin Code Status: Full  code Family  Communication: no family bedside   Disposition Plan: pending improvement in heart rate- if stays controlled today can consider discharge later today after diltiazem drip turned off   Consultants:   Cardiology  Diabetes education  CM  SLP  PT  Pulmonology  Procedures:   None  Antimicrobials:   Zosyn  Azithromycin   Augmentin   Subjective: Patient very upset this morning.  States that his breathing is as it always is, he does not need to be on soft food and that he doesn't have a problem swallowing.  Voices he is definitely leaving today.  Voices that he feels like he is being taken advantage of in the hospital and doesn't need to be here.    Objective: Vitals:   03/03/17 0600 03/03/17 0700 03/03/17 0758 03/03/17 0800  BP: (!) 148/72 (!) 126/91    Pulse: 89 83  (!) 101  Resp: (!) 24 (!) 21  (!) 24  Temp:    98.5 F (36.9 C)  TempSrc:    Oral  SpO2: 92% 92% 92% 91%  Weight:      Height:        Intake/Output Summary (Last 24 hours) at 03/03/17 0842 Last data filed at 03/03/17 0800  Gross per 24 hour  Intake          1334.87 ml  Output              800 ml  Net           534.87 ml   Filed Weights   02/28/17 0500 03/02/17 0828 03/03/17 0400  Weight: 82 kg (180 lb 12.4 oz) 80.2 kg (176 lb 12.9 oz) 81.2 kg (179 lb 0.2 oz)    Examination:  General exam:agitated and angry  Respiratory system: Crackles in mid right lung and lower right lung.  Wheezing on expiration Cardiovascular system:S1 &S2 heard, tachycardic but regular. No JVD, murmurs, rubs, gallops or clicks. No pedal edema. Gastrointestinal system:Abdomen is nondistended, soft and nontender. No organomegaly or masses felt. Normal bowel sounds heard. Central nervous system:No focal neurological deficits. Alert Extremities: Symmetric 5 x 5 power. Psychiatry:agitated, poor insight into medical problems as well as hospital course    Data Reviewed: I have personally reviewed following  labs and imaging studies  CBC:  Recent Labs Lab 02/25/17 0847 02/26/17 0415 02/27/17 0418 02/28/17 0446  WBC 13.6* 14.5* 16.7* 13.9*  NEUTROABS 11.8*  --   --   --   HGB 11.4* 9.7* 10.0* 9.3*  HCT 35.7* 29.9* 31.7* 29.2*  MCV 80.8 79.1 80.3 79.6  PLT 322 341 375 694   Basic Metabolic Panel:  Recent Labs Lab 02/25/17 1440 02/26/17 1517 02/27/17 0418 02/28/17 0446 03/01/17 0509  NA 130* 134* 134* 133* 136  K 5.5* 4.9 4.4 4.4 4.4  CL 96* 101 102 101 101  CO2 24 25 23 22 27   GLUCOSE 470* 145* 127* 203* 174*  BUN 36* 37* 35* 38* 36*  CREATININE 1.46* 1.35* 1.21 1.23 1.08  CALCIUM 8.6* 8.2* 8.0* 8.0* 8.6*  MG  --  2.0  --   --   --    GFR: Estimated Creatinine Clearance: 55.5 mL/min (by C-G formula based on SCr of 1.08 mg/dL). Liver Function Tests:  Recent Labs Lab 02/25/17 0847 02/25/17 1128  AST 24 20  ALT 19 18  ALKPHOS 75 68  BILITOT 0.4 0.4  PROT 6.9 6.1*  ALBUMIN 3.3* 2.8*    Recent Labs Lab 02/25/17 0847  LIPASE 13  No results for input(s): AMMONIA in the last 168 hours. Coagulation Profile: No results for input(s): INR, PROTIME in the last 168 hours. Cardiac Enzymes:  Recent Labs Lab 02/25/17 0847 02/25/17 2140 02/26/17 0414 02/26/17 0931  TROPONINI <0.03 0.10* 0.23* 0.22*   BNP (last 3 results) No results for input(s): PROBNP in the last 8760 hours. HbA1C: No results for input(s): HGBA1C in the last 72 hours. CBG:  Recent Labs Lab 03/02/17 0827 03/02/17 1152 03/02/17 1637 03/02/17 2157 03/03/17 0725  GLUCAP 119* 240* 199* 224* 135*   Lipid Profile: No results for input(s): CHOL, HDL, LDLCALC, TRIG, CHOLHDL, LDLDIRECT in the last 72 hours. Thyroid Function Tests: No results for input(s): TSH, T4TOTAL, FREET4, T3FREE, THYROIDAB in the last 72 hours. Anemia Panel: No results for input(s): VITAMINB12, FOLATE, FERRITIN, TIBC, IRON, RETICCTPCT in the last 72 hours. Sepsis Labs:  Recent Labs Lab 02/25/17 0857 02/25/17 2140  02/26/17 0039  LATICACIDVEN 2.43* 2.0* 1.3    Recent Results (from the past 240 hour(s))  Blood Culture (routine x 2)     Status: None   Collection Time: 02/25/17  8:58 AM  Result Value Ref Range Status   Specimen Description BLOOD LEFT HAND  Final   Special Requests   Final    BOTTLES DRAWN AEROBIC AND ANAEROBIC Blood Culture adequate volume   Culture NO GROWTH 5 DAYS  Final   Report Status 03/02/2017 FINAL  Final  Blood Culture (routine x 2)     Status: None   Collection Time: 02/25/17  9:05 AM  Result Value Ref Range Status   Specimen Description LEFT ANTECUBITAL  Final   Special Requests   Final    BOTTLES DRAWN AEROBIC AND ANAEROBIC Blood Culture adequate volume   Culture NO GROWTH 5 DAYS  Final   Report Status 03/02/2017 FINAL  Final  MRSA PCR Screening     Status: Abnormal   Collection Time: 02/25/17  8:22 PM  Result Value Ref Range Status   MRSA by PCR POSITIVE (A) NEGATIVE Final    Comment:        The GeneXpert MRSA Assay (FDA approved for NASAL specimens only), is one component of a comprehensive MRSA colonization surveillance program. It is not intended to diagnose MRSA infection nor to guide or monitor treatment for MRSA infections. RESULT CALLED TO, READ BACK BY AND VERIFIED WITH: KOGER,L @ 992 BY MATTHEWS,B 02/26/17          Radiology Studies: No results found.      Scheduled Meds: . apixaban  5 mg Oral BID  . diltiazem  240 mg Oral Daily  . guaiFENesin  600 mg Oral BID  . insulin aspart  0-15 Units Subcutaneous TID WC  . insulin aspart protamine- aspart  13 Units Subcutaneous Q supper  . insulin aspart protamine- aspart  18 Units Subcutaneous Q breakfast  . ipratropium  0.5 mg Nebulization Q6H  . levalbuterol  0.63 mg Nebulization Q6H  . polyethylene glycol  17 g Oral Daily  . senna  1 tablet Oral BID   Continuous Infusions: . azithromycin Stopped (03/02/17 1932)  . diltiazem (CARDIZEM) infusion 2.5 mg/hr (03/03/17 0600)  .  piperacillin-tazobactam (ZOSYN)  IV Stopped (03/03/17 0755)     LOS: 6 days    Time spent: 35 minutes    Loretha Stapler, MD [facility] Pager [phone]  If 7PM-7AM, please contact night-coverage [email or web address] Password [facility] 03/03/2017, 8:42 AM

## 2017-03-03 NOTE — Progress Notes (Signed)
Patient discharged. Dr. Adair Patter called in prescriptions to [place]. AVS given to patient, IV access removed and patient dressed. Rolled to Car by wheelchair.

## 2017-03-03 NOTE — Progress Notes (Signed)
Patient has been sitting in chair for about 2 hours, tolerating well.

## 2017-03-03 NOTE — Discharge Summary (Signed)
Physician Discharge Summary  RAYAN INES DGL:[id number] DOB: 11-Sep-1935 DOA: 02/25/2017  PCP: Orlena Sheldon, PA-C  Admit date: 02/25/2017 Discharge date: 03/04/2017  Admitted From: Home  Disposition:  Home   Recommendations for Outpatient Follow-up:  1. Follow up with PCP in 1-2 weeks 2. Schedule follow up with cardiology 3. Schedule follow up with Pulmonology 4. Take new medications as prescribed 5. Return precautions given 6. Please obtain BMP/CBC in one week  Home Health:Yes- SLP, PT, RN, Aide Equipment/Devices: Oxygen at 3-4L, rolling walker   Discharge Condition:Guarded CODE STATUS: Full code Diet recommendation: Dysphagia 3 diet  Brief/Interim Summary: 82 year old Caucasian male with past medical history relevant for COPD/pulmonary fibrosis as well as history of paroxysmal atrial fibrillation, hypertension and diabetes who was admitted on 02/25/2017 with sepsis from pulmonary source presumed secondary to left-sided aspiration pneumonia in the setting of intractable emesis. On 02/25/2017 post admission patient became very tachycardic was found to be in A. fib with RVR, responded well to IV Cardizem drip which has since been discontinued. Patient remains in sinus rhythm at this time. 02/27/17- patient's went is afebrile RVR again, rates- 140-150s. Cardiology consulted and patient was transitioned to PO diltiazem.  CXR showing bilateral multifocal airspace consolidation with questionable new cavity and adenopathy.  CT chest with contrast follow up showing Extensive bilateral masslike consolidative opacities with associated mediastinal and hilar lymphadenopathy, likely progressed compared to chest radiograph performed 02/25/2017 and worrisome for progression of extensive multifocal infection. Further evaluation with short-term follow-up contrast-enhanced chest CT (approximately 4-6 weeks after treatment) is recommended to ensure resolution and exclude the presence of a discrete underlying  pulmonary mass. Aortic Atherosclerosis and Emphysema.  Pulmonology consulted.  Patient went back into atrial fibrillation in the am of 6/1 and was restarted on cardizem drip.  His PO cardizem was increased and the drip was titrated down.  On the am of 03/03/17 he was placed on a long acting cardizem.  He was seen by SLP and underwent a barium swallow evaluation which showed no signs of aspiration but coughing with regular solid foods.  He was started on a dysphagia 3 diet.  He was instructed to eat soft foods and to eat slowly.  His heart rate was well controlled prior to admission.  He was very angry on day of discharge that he felt as though he had been kept too long in the hospital.  Attempts to discuss medications that were new were made but patient refused to engage in conversation.  Call was placed to patient's family and his son stated he would be present at time of discharge to learn what medications patient needed.  Patient son voiced understanding that the medications prescribed at discharge were to manage patient's infection (pneumonia), control patient's heart rate (atrial fibrillation), keep patient from a stroke related to atrial fibrillation and control his blood sugar.  Patient son voiced understanding.  Discussed with pharmacy the cost of medications and cost for insulin through insurance was higher than cost without insurance.  Patient will be given a card discount for eliquis.   Discharge Diagnoses:  Principal Problem:   Sepsis ([place]) Active Problems:   Diabetes mellitus type 2, uncontrolled ([name])   COPD (chronic obstructive pulmonary disease) ([name])   Hypertension   Emesis, persistent   Constipation   Overflow diarrhea/Constipation   Rt Sided Aspiration pneumonia ([name])   Chronic diastolic heart failure ([name])   Elevated troponin   Atrial fibrillation with rapid ventricular response [facility])    Discharge Instructions  Discharge Instructions    Call MD for:  difficulty breathing,  headache or visual disturbances    Complete by:  As directed    Call MD for:  extreme fatigue    Complete by:  As directed    Call MD for:  hives    Complete by:  As directed    Call MD for:  persistant dizziness or light-headedness    Complete by:  As directed    Call MD for:  persistant nausea and vomiting    Complete by:  As directed    Call MD for:  severe uncontrolled pain    Complete by:  As directed    Call MD for:  temperature >100.4    Complete by:  As directed    Diet - low sodium heart healthy    Complete by:  As directed    Diet Carb Modified    Complete by:  As directed    Discharge instructions    Complete by:  As directed    Take new medications as prescribed Follow up with your PCP within 1 week Discuss recent hospitalization with your PCP   Increase activity slowly    Complete by:  As directed      Allergies as of 03/03/2017      Reactions   Ace Inhibitors Other (See Comments)   Hyperkalemia--07/23/2013:patient states not familiar with the following allergy      Medication List    STOP taking these medications   aspirin 81 MG EC tablet     TAKE these medications   albuterol (2.5 MG/3ML) 0.083% nebulizer solution Commonly known as:  PROVENTIL INHALE 1 VIAL VIA NEBULIZER EVERY 6 HOURS AS NEEDED FOR WHEEZING OR SHORTNESS OF BREATH   amoxicillin-clavulanate 875-125 MG tablet Commonly known as:  AUGMENTIN Take 1 tablet by mouth every 12 (twelve) hours.   apixaban 5 MG Tabs tablet Commonly known as:  ELIQUIS Take 1 tablet (5 mg total) by mouth 2 (two) times daily.   azithromycin 250 MG tablet Commonly known as:  ZITHROMAX Take 2 tablets (500 mg total) by mouth daily.   budesonide 0.5 MG/2ML nebulizer solution Commonly known as:  PULMICORT Take 2 mLs (0.5 mg total) by nebulization 2 (two) times daily.   diltiazem 240 MG 24 hr capsule Commonly known as:  CARDIZEM CD Take 1 capsule (240 mg total) by mouth daily.   furosemide 20 MG tablet Commonly  known as:  LASIX Take 2 each morning.   insulin aspart protamine- aspart (70-30) 100 UNIT/ML injection Commonly known as:  NOVOLOG MIX 70/30 Inject 0.18 mLs (18 Units total) into the skin daily with breakfast.   insulin NPH Human 100 UNIT/ML injection Commonly known as:  NOVOLIN N 18 units with breakfast daily and 13 units with supper daily   ipratropium 0.02 % nebulizer solution Commonly known as:  ATROVENT Take 2.5 mLs (0.5 mg total) by nebulization 4 (four) times daily.   losartan 100 MG tablet Commonly known as:  COZAAR Take 0.5 tablets (50 mg total) by mouth daily. What changed:  how much to take   metFORMIN 1000 MG tablet Commonly known as:  GLUCOPHAGE Take 1 tablet (1,000 mg total) by mouth 2 (two) times daily.   omeprazole 20 MG capsule Commonly known as:  PRILOSEC Take 1 capsule (20 mg total) by mouth daily.   pravastatin 80 MG tablet Commonly known as:  PRAVACHOL Take 1 tablet (80 mg total) by mouth at bedtime.      Follow-up  Information    Orlena Sheldon, PA-C. Schedule an appointment as soon as possible for a visit in 1 week(s).   Specialty:  Physician Assistant Contact information: [place] [address] [phone]        Sinda Du, MD. Schedule an appointment as soon as possible for a visit in 1 week(s).   Specialty:  Pulmonary Disease Contact information: [place] [address] [phone]        Arnoldo Lenis, MD. Schedule an appointment as soon as possible for a visit in 2 week(s).   Specialty:  Cardiology Contact information: [place] [address] [phone]          Allergies  Allergen Reactions  . Ace Inhibitors Other (See Comments)    Hyperkalemia--07/23/2013:patient states not familiar with the following allergy    Consultations:  Cardiology  Pulmonology  SLP  CM   Procedures/Studies: Dg Chest 2 View  Result Date:  02/27/2017 CLINICAL DATA:  Followup pneumonia.  Shortness of breath. EXAM: CHEST  2 VIEW COMPARISON:  02/25/2017 FINDINGS: Normal heart size. Small pleural effusions are identified. Bilateral multifocal airspace consolidation is identified involving the right upper lobe, right lower lobe and left lower lobe. There maybe central cavitation within the left lower lobe opacification. There is mild diffuse edema noted. Prominence of the hilar structures are identified bilaterally. Cannot rule out adenopathy. IMPRESSION: 1. Bilateral multifocal airspace consolidation is again noted which in the acute setting is compatible with pneumonia. There may be new cavitation associated with left lower lobe airspace consolidation. Consider further evaluation with contrast enhanced CT of the chest. 2. Cannot rule out bilateral hilar adenopathy. Attention on CT is advised. Electronically Signed   By: Kerby Moors M.D.   On: 02/27/2017 10:48   Dg Abdomen 1 View  Result Date: 02/25/2017 CLINICAL DATA:  Abdominal pain. EXAM: ABDOMEN - 1 VIEW COMPARISON:  March 16, 2016 FINDINGS: Moderate fecal loading seen throughout the colon. No bowel obstruction is identified on today's study. No free air, portal venous gas, or pneumatosis. IMPRESSION: Moderate fecal loading in the colon.  No other abnormalities. Electronically Signed   By: Dorise Bullion III M.D   On: 02/25/2017 09:40   Ct Chest W Contrast  Result Date: 02/28/2017 CLINICAL DATA:  Admitted with pneumonia, shortness of breath and cough. EXAM: CT CHEST WITH CONTRAST TECHNIQUE: Multidetector CT imaging of the chest was performed during intravenous contrast administration. CONTRAST:  42mL ISOVUE-300 IOPAMIDOL (ISOVUE-300) INJECTION 61% COMPARISON:  Chest radiograph - 02/27/2017 ; 02/25/2017; 07/24/2016 FINDINGS: Cardiovascular: Borderline cardiomegaly. Coronary artery calcifications. No pericardial effusion. Mediastinum/Nodes: Mediastinal and hilar lymphadenopathy with index  pretracheal lymph node measuring 1.2 cm in greatest short axis diameter (59, series 20), index high right paratracheal lymph node measuring 0.9 cm (image 34, series 2), index right infrahilar lymph node measuring 1.1 cm (image 87) and index left infrahilar lymph node measuring 1.2 cm (image 86). No axillary lymphadenopathy. Lungs/Pleura: Extensive bilateral masslike airspace opacities with index opacity with the right lower lobe measuring approximately 8.0 x 4.0 cm (image 97, series 4), index opacity within the right upper lobe measuring approximately 6.0 x 5.2 cm and index opacity within the superior segment of the left lower lobe measuring approximately 6.2 x 3.2 cm (image 87, series 4). All these masslike opacities are associated with rather extensive adjacent ground-glass. Small right and trace left-sided pleural effusions. There is narrowing of the bilateral lower lobe bronchi however the  bronchial airways appear patent. Advanced apical predominant mixed centrilobular and paraseptal emphysematous change. No pneumothorax. Upper Abdomen: Evaluation of the upper abdomen is degraded secondary to patient respiratory artifact. Potential ill-defined stranding about the imaged cranial aspect of the gallbladder, could be artifactual due to patient motion. Atherosclerotic plaque with the abdominal aorta. The pancreas is largely fatty replaced. Musculoskeletal: No acute or aggressive osseous abnormalities. DDD within the image caudal aspect of the cervical spine. Degenerative change of the bilateral sternoclavicular joints. Regional soft tissues appear normal. Normal appearance of the thyroid gland. IMPRESSION: 1. Extensive bilateral masslike consolidative opacities with associated mediastinal and hilar lymphadenopathy, likely progressed compared to chest radiograph performed 02/25/2017 and worrisome for progression of extensive multifocal infection. Further evaluation with short-term follow-up contrast-enhanced chest CT  (approximately 4-6 weeks after treatment) is recommended to ensure resolution and exclude the presence of a discrete underlying pulmonary mass. 2. Aortic Atherosclerosis (ICD10-I70.0) and Emphysema (ICD10-J43.9). Electronically Signed   By: Sandi Mariscal M.D.   On: 02/28/2017 10:16   Dg Chest Port 1 View  Result Date: 03/04/2017 CLINICAL DATA:  Pulmonary fibrosis and pneumonia. EXAM: PORTABLE CHEST 1 VIEW COMPARISON:  CT scan 02/28/2017.  Chest x-ray 02/27/2017. FINDINGS: Focal airspace consolidation identified right mid lung. Right infrahilar airspace disease is new in the interval. There is persistent retrocardiac left base collapse/ consolidation. Underlying chronic interstitial changes suspected. Cardiopericardial silhouette is at upper limits of normal for size. The visualized bony structures of the thorax are intact. Telemetry leads overlie the chest. IMPRESSION: Multifocal airspace disease with some progression in the infrahilar right lower lobe, compatible with pneumonia. Electronically Signed   By: Misty Stanley M.D.   On: 03/04/2017 11:44   Dg Chest Port 1 View  Result Date: 02/25/2017 CLINICAL DATA:  Shortness of breath. EXAM: PORTABLE CHEST 1 VIEW COMPARISON:  July 24, 2016 FINDINGS: Infiltrates are seen in the right mid and medial right lower lung probably involving both the upper and lower lobes. No infiltrate seen on the left. The cardiomediastinal silhouette is normal. No pneumothorax. No other acute abnormalities. IMPRESSION: Multifocal right sided infiltrates suggesting pneumonia given history. Recommend treatment and follow-up to resolution. Electronically Signed   By: Dorise Bullion III M.D   On: 02/25/2017 09:36      Subjective: Patient is very upset today.  Says he absolutely will be leaving because he has been in the hospital long enough.  Unwilling to engage in conversation about new medications.  Discharge Exam: Vitals:   03/03/17 1500 03/03/17 1600  BP: (!) 131/59 (!)  169/131  Pulse: 89 94  Resp: (!) 21 18  Temp:     Vitals:   03/03/17 1400 03/03/17 1410 03/03/17 1500 03/03/17 1600  BP:  (!) 154/76 (!) 131/59 (!) 169/131  Pulse: 97  89 94  Resp: (!) 24  (!) 21 18  Temp:      TempSrc:      SpO2: 92% 91% (!) 88% (!) 89%  Weight:      Height:        General: Pt is alert, awake, not in acute distress, sitting in chair Cardiovascular: RRR, S1/S2 +, no rubs, no gallops Respiratory: scattered rhonchi, expiratory wheezes Abdominal: Soft, NT, ND, bowel sounds + Extremities: no edema, no cyanosis    The results of significant diagnostics from this hospitalization (including imaging, microbiology, ancillary and laboratory) are listed below for reference.     Microbiology: Recent Results (from the past 240 hour(s))  Blood Culture (routine x 2)  Status: None   Collection Time: 02/25/17  8:58 AM  Result Value Ref Range Status   Specimen Description BLOOD LEFT HAND  Final   Special Requests   Final    BOTTLES DRAWN AEROBIC AND ANAEROBIC Blood Culture adequate volume   Culture NO GROWTH 5 DAYS  Final   Report Status 03/02/2017 FINAL  Final  Blood Culture (routine x 2)     Status: None   Collection Time: 02/25/17  9:05 AM  Result Value Ref Range Status   Specimen Description LEFT ANTECUBITAL  Final   Special Requests   Final    BOTTLES DRAWN AEROBIC AND ANAEROBIC Blood Culture adequate volume   Culture NO GROWTH 5 DAYS  Final   Report Status 03/02/2017 FINAL  Final  MRSA PCR Screening     Status: Abnormal   Collection Time: 02/25/17  8:22 PM  Result Value Ref Range Status   MRSA by PCR POSITIVE (A) NEGATIVE Final    Comment:        The GeneXpert MRSA Assay (FDA approved for NASAL specimens only), is one component of a comprehensive MRSA colonization surveillance program. It is not intended to diagnose MRSA infection nor to guide or monitor treatment for MRSA infections. RESULT CALLED TO, READ BACK BY AND VERIFIED WITH: KOGER,L @ 833  BY MATTHEWS,B 02/26/17      Labs: BNP (last 3 results) No results for input(s): BNP in the last 8760 hours. Basic Metabolic Panel:  Recent Labs Lab 02/26/17 1517 02/27/17 0418 02/28/17 0446 03/01/17 0509 03/04/17 1139  NA 134* 134* 133* 136 134*  K 4.9 4.4 4.4 4.4 4.9  CL 101 102 101 101 101  CO2 25 23 22 27 23   GLUCOSE 145* 127* 203* 174* 434*  BUN 37* 35* 38* 36* 37*  CREATININE 1.35* 1.21 1.23 1.08 1.20  CALCIUM 8.2* 8.0* 8.0* 8.6* 9.9  MG 2.0  --   --   --   --    Liver Function Tests: No results for input(s): AST, ALT, ALKPHOS, BILITOT, PROT, ALBUMIN in the last 168 hours. No results for input(s): LIPASE, AMYLASE in the last 168 hours. No results for input(s): AMMONIA in the last 168 hours. CBC:  Recent Labs Lab 02/26/17 0415 02/27/17 0418 02/28/17 0446 03/04/17 1139  WBC 14.5* 16.7* 13.9* 28.5*  NEUTROABS  --   --   --  25.4*  HGB 9.7* 10.0* 9.3* 10.2*  HCT 29.9* 31.7* 29.2* 30.8*  MCV 79.1 80.3 79.6 78.0  PLT 341 375 342 605*   Cardiac Enzymes:  Recent Labs Lab 02/25/17 2140 02/26/17 0414 02/26/17 0931 03/04/17 1139  TROPONINI 0.10* 0.23* 0.22* 0.04*   BNP: Invalid input(s): POCBNP CBG:  Recent Labs Lab 03/02/17 1152 03/02/17 1637 03/02/17 2157 03/03/17 0725 03/03/17 1105  GLUCAP 240* 199* 224* 135* 101*   D-Dimer No results for input(s): DDIMER in the last 72 hours. Hgb A1c No results for input(s): HGBA1C in the last 72 hours. Lipid Profile No results for input(s): CHOL, HDL, LDLCALC, TRIG, CHOLHDL, LDLDIRECT in the last 72 hours. Thyroid function studies No results for input(s): TSH, T4TOTAL, T3FREE, THYROIDAB in the last 72 hours.  Invalid input(s): FREET3 Anemia work up No results for input(s): VITAMINB12, FOLATE, FERRITIN, TIBC, IRON, RETICCTPCT in the last 72 hours. Urinalysis    Component Value Date/Time   COLORURINE YELLOW 02/25/2017 1000   APPEARANCEUR CLEAR 02/25/2017 1000   LABSPEC <1.005 (L) 02/25/2017 1000    PHURINE 5.5 02/25/2017 1000   GLUCOSEU >=500 (A)  02/25/2017 1000   HGBUR TRACE (A) 02/25/2017 1000   BILIRUBINUR NEGATIVE 02/25/2017 1000   KETONESUR NEGATIVE 02/25/2017 1000   PROTEINUR TRACE (A) 02/25/2017 1000   NITRITE NEGATIVE 02/25/2017 1000   LEUKOCYTESUR NEGATIVE 02/25/2017 1000   Sepsis Labs Invalid input(s): PROCALCITONIN,  WBC,  LACTICIDVEN Microbiology Recent Results (from the past 240 hour(s))  Blood Culture (routine x 2)     Status: None   Collection Time: 02/25/17  8:58 AM  Result Value Ref Range Status   Specimen Description BLOOD LEFT HAND  Final   Special Requests   Final    BOTTLES DRAWN AEROBIC AND ANAEROBIC Blood Culture adequate volume   Culture NO GROWTH 5 DAYS  Final   Report Status 03/02/2017 FINAL  Final  Blood Culture (routine x 2)     Status: None   Collection Time: 02/25/17  9:05 AM  Result Value Ref Range Status   Specimen Description LEFT ANTECUBITAL  Final   Special Requests   Final    BOTTLES DRAWN AEROBIC AND ANAEROBIC Blood Culture adequate volume   Culture NO GROWTH 5 DAYS  Final   Report Status 03/02/2017 FINAL  Final  MRSA PCR Screening     Status: Abnormal   Collection Time: 02/25/17  8:22 PM  Result Value Ref Range Status   MRSA by PCR POSITIVE (A) NEGATIVE Final    Comment:        The GeneXpert MRSA Assay (FDA approved for NASAL specimens only), is one component of a comprehensive MRSA colonization surveillance program. It is not intended to diagnose MRSA infection nor to guide or monitor treatment for MRSA infections. RESULT CALLED TO, READ BACK BY AND VERIFIED WITH: KOGER,L @ 038 BY MATTHEWS,B 02/26/17      Time coordinating discharge: 45 minutes  SIGNED:   Loretha Stapler, MD  [facility] 03/04/2017, 2:58 PM Pager [phone] If 7PM-7AM, please contact night-coverage [email or web address] Password [email or web address]

## 2017-03-04 ENCOUNTER — Encounter (HOSPITAL_COMMUNITY): Payer: Self-pay | Admitting: Emergency Medicine

## 2017-03-04 ENCOUNTER — Emergency Department (HOSPITAL_COMMUNITY): Payer: Medicare Other

## 2017-03-04 ENCOUNTER — Inpatient Hospital Stay (HOSPITAL_COMMUNITY)
Admission: EM | Admit: 2017-03-04 | Discharge: 2017-03-10 | DRG: 189 | Disposition: A | Discharge status: Hospice | Payer: Medicare Other | Attending: Internal Medicine | Admitting: Internal Medicine

## 2017-03-04 DIAGNOSIS — J9610 Chronic respiratory failure, unspecified whether with hypoxia or hypercapnia: Secondary | ICD-10-CM | POA: Diagnosis present

## 2017-03-04 DIAGNOSIS — Z79899 Other long term (current) drug therapy: Secondary | ICD-10-CM

## 2017-03-04 DIAGNOSIS — Z833 Family history of diabetes mellitus: Secondary | ICD-10-CM | POA: Diagnosis not present

## 2017-03-04 DIAGNOSIS — Z22322 Carrier or suspected carrier of Methicillin resistant Staphylococcus aureus: Secondary | ICD-10-CM

## 2017-03-04 DIAGNOSIS — J8410 Pulmonary fibrosis, unspecified: Secondary | ICD-10-CM | POA: Diagnosis present

## 2017-03-04 DIAGNOSIS — J9621 Acute and chronic respiratory failure with hypoxia: Secondary | ICD-10-CM | POA: Diagnosis not present

## 2017-03-04 DIAGNOSIS — E7800 Pure hypercholesterolemia, unspecified: Secondary | ICD-10-CM | POA: Diagnosis present

## 2017-03-04 DIAGNOSIS — J441 Chronic obstructive pulmonary disease with (acute) exacerbation: Secondary | ICD-10-CM | POA: Diagnosis present

## 2017-03-04 DIAGNOSIS — Z7901 Long term (current) use of anticoagulants: Secondary | ICD-10-CM

## 2017-03-04 DIAGNOSIS — N183 Chronic kidney disease, stage 3 unspecified: Secondary | ICD-10-CM

## 2017-03-04 DIAGNOSIS — E11649 Type 2 diabetes mellitus with hypoglycemia without coma: Secondary | ICD-10-CM | POA: Diagnosis not present

## 2017-03-04 DIAGNOSIS — J9620 Acute and chronic respiratory failure, unspecified whether with hypoxia or hypercapnia: Secondary | ICD-10-CM | POA: Diagnosis not present

## 2017-03-04 DIAGNOSIS — IMO0002 Reserved for concepts with insufficient information to code with codable children: Secondary | ICD-10-CM | POA: Diagnosis present

## 2017-03-04 DIAGNOSIS — Z66 Do not resuscitate: Secondary | ICD-10-CM | POA: Diagnosis not present

## 2017-03-04 DIAGNOSIS — I480 Paroxysmal atrial fibrillation: Secondary | ICD-10-CM | POA: Diagnosis present

## 2017-03-04 DIAGNOSIS — E1122 Type 2 diabetes mellitus with diabetic chronic kidney disease: Secondary | ICD-10-CM | POA: Diagnosis not present

## 2017-03-04 DIAGNOSIS — E1121 Type 2 diabetes mellitus with diabetic nephropathy: Secondary | ICD-10-CM | POA: Diagnosis not present

## 2017-03-04 DIAGNOSIS — J440 Chronic obstructive pulmonary disease with acute lower respiratory infection: Secondary | ICD-10-CM | POA: Diagnosis not present

## 2017-03-04 DIAGNOSIS — I5032 Chronic diastolic (congestive) heart failure: Secondary | ICD-10-CM | POA: Diagnosis not present

## 2017-03-04 DIAGNOSIS — R061 Stridor: Secondary | ICD-10-CM | POA: Diagnosis not present

## 2017-03-04 DIAGNOSIS — I4891 Unspecified atrial fibrillation: Secondary | ICD-10-CM | POA: Diagnosis present

## 2017-03-04 DIAGNOSIS — I5033 Acute on chronic diastolic (congestive) heart failure: Secondary | ICD-10-CM | POA: Diagnosis not present

## 2017-03-04 DIAGNOSIS — Z7189 Other specified counseling: Secondary | ICD-10-CM

## 2017-03-04 DIAGNOSIS — Z7951 Long term (current) use of inhaled steroids: Secondary | ICD-10-CM | POA: Diagnosis not present

## 2017-03-04 DIAGNOSIS — Z794 Long term (current) use of insulin: Secondary | ICD-10-CM

## 2017-03-04 DIAGNOSIS — Z8249 Family history of ischemic heart disease and other diseases of the circulatory system: Secondary | ICD-10-CM

## 2017-03-04 DIAGNOSIS — J690 Pneumonitis due to inhalation of food and vomit: Secondary | ICD-10-CM | POA: Diagnosis not present

## 2017-03-04 DIAGNOSIS — J189 Pneumonia, unspecified organism: Secondary | ICD-10-CM

## 2017-03-04 DIAGNOSIS — R0902 Hypoxemia: Secondary | ICD-10-CM | POA: Diagnosis not present

## 2017-03-04 DIAGNOSIS — D638 Anemia in other chronic diseases classified elsewhere: Secondary | ICD-10-CM | POA: Diagnosis present

## 2017-03-04 DIAGNOSIS — Z9114 Patient's other noncompliance with medication regimen: Secondary | ICD-10-CM | POA: Diagnosis not present

## 2017-03-04 DIAGNOSIS — J439 Emphysema, unspecified: Secondary | ICD-10-CM

## 2017-03-04 DIAGNOSIS — Z9981 Dependence on supplemental oxygen: Secondary | ICD-10-CM | POA: Diagnosis not present

## 2017-03-04 DIAGNOSIS — J181 Lobar pneumonia, unspecified organism: Secondary | ICD-10-CM

## 2017-03-04 DIAGNOSIS — Z87891 Personal history of nicotine dependence: Secondary | ICD-10-CM | POA: Diagnosis not present

## 2017-03-04 DIAGNOSIS — I130 Hypertensive heart and chronic kidney disease with heart failure and stage 1 through stage 4 chronic kidney disease, or unspecified chronic kidney disease: Secondary | ICD-10-CM | POA: Diagnosis present

## 2017-03-04 DIAGNOSIS — J449 Chronic obstructive pulmonary disease, unspecified: Secondary | ICD-10-CM | POA: Diagnosis not present

## 2017-03-04 DIAGNOSIS — Z515 Encounter for palliative care: Secondary | ICD-10-CM

## 2017-03-04 DIAGNOSIS — G9341 Metabolic encephalopathy: Secondary | ICD-10-CM | POA: Diagnosis not present

## 2017-03-04 DIAGNOSIS — Y95 Nosocomial condition: Secondary | ICD-10-CM | POA: Diagnosis present

## 2017-03-04 DIAGNOSIS — Z961 Presence of intraocular lens: Secondary | ICD-10-CM | POA: Diagnosis not present

## 2017-03-04 DIAGNOSIS — I6789 Other cerebrovascular disease: Secondary | ICD-10-CM | POA: Diagnosis not present

## 2017-03-04 DIAGNOSIS — Z888 Allergy status to other drugs, medicaments and biological substances status: Secondary | ICD-10-CM

## 2017-03-04 DIAGNOSIS — E1165 Type 2 diabetes mellitus with hyperglycemia: Secondary | ICD-10-CM

## 2017-03-04 DIAGNOSIS — I482 Chronic atrial fibrillation: Secondary | ICD-10-CM | POA: Diagnosis not present

## 2017-03-04 DIAGNOSIS — R531 Weakness: Secondary | ICD-10-CM | POA: Diagnosis not present

## 2017-03-04 LAB — CBC WITH DIFFERENTIAL/PLATELET
BASOS PCT: 0 %
Basophils Absolute: 0 10*3/uL (ref 0.0–0.1)
Eosinophils Absolute: 0 10*3/uL (ref 0.0–0.7)
Eosinophils Relative: 0 %
HEMATOCRIT: 30.8 % — AB (ref 39.0–52.0)
HEMOGLOBIN: 10.2 g/dL — AB (ref 13.0–17.0)
Lymphocytes Relative: 4 %
Lymphs Abs: 1.1 10*3/uL (ref 0.7–4.0)
MCH: 25.8 pg — ABNORMAL LOW (ref 26.0–34.0)
MCHC: 33.1 g/dL (ref 30.0–36.0)
MCV: 78 fL (ref 78.0–100.0)
MONOS PCT: 7 %
Monocytes Absolute: 2 10*3/uL — ABNORMAL HIGH (ref 0.1–1.0)
NEUTROS PCT: 89 %
Neutro Abs: 25.4 10*3/uL — ABNORMAL HIGH (ref 1.7–7.7)
Platelets: 605 10*3/uL — ABNORMAL HIGH (ref 150–400)
RBC: 3.95 MIL/uL — AB (ref 4.22–5.81)
RDW: 17.5 % — ABNORMAL HIGH (ref 11.5–15.5)
WBC: 28.5 10*3/uL — AB (ref 4.0–10.5)

## 2017-03-04 LAB — BASIC METABOLIC PANEL
ANION GAP: 10 (ref 5–15)
BUN: 37 mg/dL — ABNORMAL HIGH (ref 6–20)
CALCIUM: 9.9 mg/dL (ref 8.9–10.3)
CHLORIDE: 101 mmol/L (ref 101–111)
CO2: 23 mmol/L (ref 22–32)
Creatinine, Ser: 1.2 mg/dL (ref 0.61–1.24)
GFR calc non Af Amer: 56 mL/min — ABNORMAL LOW (ref >60)
GLUCOSE: 434 mg/dL — AB (ref 65–99)
POTASSIUM: 4.9 mmol/L (ref 3.5–5.1)
Sodium: 134 mmol/L — ABNORMAL LOW (ref 135–145)

## 2017-03-04 LAB — BLOOD GAS, ARTERIAL
Acid-base deficit: 0 mmol/L (ref 0.0–2.0)
Bicarbonate: 24.3 mmol/L (ref 20.0–28.0)
Delivery systems: POSITIVE
Drawn by: 27407
EXPIRATORY PAP: 8
FIO2: 50
INSPIRATORY PAP: 16
O2 SAT: 96 %
PATIENT TEMPERATURE: 37
PCO2 ART: 41.7 mmHg (ref 32.0–48.0)
PO2 ART: 90.6 mmHg (ref 83.0–108.0)
RATE: 10 resp/min
pH, Arterial: 7.385 (ref 7.350–7.450)

## 2017-03-04 LAB — TROPONIN I: Troponin I: 0.04 ng/mL (ref <0.03)

## 2017-03-04 LAB — GLUCOSE, CAPILLARY
Glucose-Capillary: 385 mg/dL — ABNORMAL HIGH (ref 65–99)
Glucose-Capillary: 377 mg/dL — ABNORMAL HIGH (ref 65–99)

## 2017-03-04 MED ORDER — ALBUTEROL SULFATE (2.5 MG/3ML) 0.083% IN NEBU: 2.5000 mg | INHALATION_SOLUTION | RESPIRATORY_TRACT | Status: DC

## 2017-03-04 MED ORDER — SODIUM CHLORIDE 0.9 % IV BOLUS (SEPSIS)
500.0000 mL | Freq: Once | INTRAVENOUS | Status: AC
  Administered 2017-03-04: 500 mL via INTRAVENOUS

## 2017-03-04 MED ORDER — INSULIN ASPART 100 UNIT/ML ~~LOC~~ SOLN
0.0000 [IU] | SUBCUTANEOUS | Status: DC
  Administered 2017-03-04 (×2): 20 [IU] via SUBCUTANEOUS
  Administered 2017-03-05: 11 [IU] via SUBCUTANEOUS
  Administered 2017-03-05: 15 [IU] via SUBCUTANEOUS
  Administered 2017-03-05: 7 [IU] via SUBCUTANEOUS

## 2017-03-04 MED ORDER — PIPERACILLIN-TAZOBACTAM 3.375 G IVPB
3.3750 g | Freq: Three times a day (TID) | INTRAVENOUS | Status: DC
  Administered 2017-03-04 – 2017-03-10 (×17): 3.375 g via INTRAVENOUS
  Filled 2017-03-04 (×16): qty 50

## 2017-03-04 MED ORDER — IPRATROPIUM BROMIDE 0.02 % IN SOLN: 0.5000 mg | RESPIRATORY_TRACT | Status: DC

## 2017-03-04 MED ORDER — ONDANSETRON HCL 4 MG/2ML IJ SOLN
4.0000 mg | Freq: Four times a day (QID) | INTRAMUSCULAR | Status: DC | PRN
Start: 2017-03-04 — End: 2017-03-10

## 2017-03-04 MED ORDER — INSULIN GLARGINE 100 UNIT/ML ~~LOC~~ SOLN
20.0000 [IU] | Freq: Every day | SUBCUTANEOUS | Status: DC
  Administered 2017-03-04 – 2017-03-05 (×2): 20 [IU] via SUBCUTANEOUS
  Filled 2017-03-04 (×3): qty 0.2

## 2017-03-04 MED ORDER — METHYLPREDNISOLONE SODIUM SUCC 125 MG IJ SOLR
60.0000 mg | Freq: Two times a day (BID) | INTRAMUSCULAR | Status: AC
  Administered 2017-03-04 – 2017-03-08 (×9): 60 mg via INTRAVENOUS
  Filled 2017-03-04 (×9): qty 2

## 2017-03-04 MED ORDER — DILTIAZEM HCL 100 MG IV SOLR
5.0000 mg/h | INTRAVENOUS | Status: DC
  Administered 2017-03-04: 15 mg/h via INTRAVENOUS
  Administered 2017-03-05: 5 mg/h via INTRAVENOUS
  Filled 2017-03-04 (×2): qty 100

## 2017-03-04 MED ORDER — DEXTROSE 5 % IV SOLN
5.0000 mg/h | Freq: Once | INTRAVENOUS | Status: AC
  Administered 2017-03-04: 5 mg/h via INTRAVENOUS
  Filled 2017-03-04: qty 100

## 2017-03-04 MED ORDER — ACETAMINOPHEN 325 MG PO TABS: 650.0000 mg | ORAL_TABLET | Freq: Four times a day (QID) | ORAL | Status: DC | PRN

## 2017-03-04 MED ORDER — SODIUM CHLORIDE 0.9 % IV SOLN
1500.0000 mg | Freq: Once | INTRAVENOUS | Status: AC
  Administered 2017-03-04: 1500 mg via INTRAVENOUS
  Filled 2017-03-04: qty 1500

## 2017-03-04 MED ORDER — BUDESONIDE 0.5 MG/2ML IN SUSP
0.5000 mg | Freq: Two times a day (BID) | RESPIRATORY_TRACT | Status: DC
  Administered 2017-03-04 – 2017-03-10 (×12): 0.5 mg via RESPIRATORY_TRACT
  Filled 2017-03-04 (×13): qty 2

## 2017-03-04 MED ORDER — CEFEPIME HCL 2 G IJ SOLR
2.0000 g | Freq: Once | INTRAMUSCULAR | Status: AC
  Administered 2017-03-04: 2 g via INTRAVENOUS
  Filled 2017-03-04: qty 2

## 2017-03-04 MED ORDER — ENOXAPARIN SODIUM 40 MG/0.4ML ~~LOC~~ SOLN
40.0000 mg | SUBCUTANEOUS | Status: DC
  Administered 2017-03-04: 40 mg via SUBCUTANEOUS
  Filled 2017-03-04: qty 0.4

## 2017-03-04 MED ORDER — SODIUM CHLORIDE 0.9 % IV SOLN
INTRAVENOUS | Status: DC
  Administered 2017-03-04 – 2017-03-05 (×2): via INTRAVENOUS

## 2017-03-04 MED ORDER — ONDANSETRON HCL 4 MG PO TABS
4.0000 mg | ORAL_TABLET | Freq: Four times a day (QID) | ORAL | Status: DC | PRN
  Administered 2017-03-08: 4 mg via ORAL
  Filled 2017-03-04: qty 1

## 2017-03-04 MED ORDER — ACETAMINOPHEN 650 MG RE SUPP: 650.0000 mg | Freq: Four times a day (QID) | RECTAL | Status: DC | PRN

## 2017-03-04 MED ORDER — IPRATROPIUM-ALBUTEROL 0.5-2.5 (3) MG/3ML IN SOLN
3.0000 mL | RESPIRATORY_TRACT | Status: DC
  Administered 2017-03-04 – 2017-03-06 (×11): 3 mL via RESPIRATORY_TRACT
  Filled 2017-03-04 (×11): qty 3

## 2017-03-04 MED ORDER — VANCOMYCIN HCL IN DEXTROSE 750-5 MG/150ML-% IV SOLN
750.0000 mg | Freq: Two times a day (BID) | INTRAVENOUS | Status: DC
  Administered 2017-03-05 – 2017-03-08 (×8): 750 mg via INTRAVENOUS
  Filled 2017-03-04 (×10): qty 150

## 2017-03-04 NOTE — ED Notes (Signed)
Difficult blood draw. Antibiotics started before cultures obtained due to delay

## 2017-03-04 NOTE — ED Notes (Signed)
RT notified pt's sats ranging from 85-90 currently on Bipap.  Good seal noted to mask.

## 2017-03-04 NOTE — H&P (Signed)
History and Physical    Edward [name] ZSW:[id number] DOB: [birth year] DOA: 03/04/2017  PCP: Orlena Sheldon, PA-C  Patient coming from: home  I have personally briefly reviewed patient's old medical records in [place]  Chief Complaint: shortness of breath  HPI: Edward [name] is a 82 y.o. male with medical history significant of chronic respiratory failure on home oxygen, COPD, pulmonary fibrosis, chronic diastolic congestive heart failure, diabetes. He was recently in the hospital and discharged yesterday after being treated for pneumonia, rapid atrial fibrillation and sepsis. On his insistence, he was discharged home yesterday. Per records, the patient became increasingly short of breath after returning home as well as being increasingly confused. He was brought back to the emergency room today. Upon EMS arrival he was noted to not be wearing any oxygen with pulse ox in the 70s. He was given albuterol treatments and Solu-Medrol on route. At this time patient is very somnolent and cannot provide any history. On arrival to the emergency room he was placed on BiPAP. He was noted to be in rapid atrial fibrillation and started on a Cardizem infusion. Repeat chest x-ray indicated persistent pneumonia. He's been referred for admission.   Review of Systems: unable to assess due to mental status  Past Medical History:  Diagnosis Date  . Allergy    Rhinitis  . Bronchitis   . Chronic respiratory failure ([place])   . Colon polyps   . COPD (chronic obstructive pulmonary disease) ([place])   . Diabetes mellitus   . Elevated lipids   . Hypercholesterolemia   . Hypertension   . On home O2    2L N/C   . PSA elevation   . Pulmonary fibrosis ([place])   . Vitamin D deficiency     Past Surgical History:  Procedure Laterality Date  . CATARACT EXTRACTION W/PHACO  06/25/2012   Procedure: CATARACT EXTRACTION PHACO AND INTRAOCULAR LENS PLACEMENT (IOC);  Surgeon: Elta Guadeloupe T. Gershon Crane, MD;  Location: [facility];   Service: Ophthalmology;  Laterality: Left;  CDE=19.01  . CATARACT EXTRACTION W/PHACO  07/09/2012   Procedure: CATARACT EXTRACTION PHACO AND INTRAOCULAR LENS PLACEMENT (IOC);  Surgeon: Elta Guadeloupe T. Gershon Crane, MD;  Location: [facility];  Service: Ophthalmology;  Laterality: Right;  CDE: 20.09     reports that he quit smoking about 4 years ago. His smoking use included Cigarettes. He has a 90.00 pack-year smoking history. He has never used smokeless tobacco. He reports that he does not drink alcohol or use drugs.  Allergies  Allergen Reactions  . Ace Inhibitors Other (See Comments)    Hyperkalemia--07/23/2013:patient states not familiar with the following allergy    Family History  Problem Relation Age of Onset  . Heart disease Mother   . CAD Other   . Diabetes Other      Prior to Admission medications   Medication Sig Start Date End Date Taking? Authorizing Provider  albuterol (PROVENTIL) (2.5 MG/3ML) 0.083% nebulizer solution INHALE 1 VIAL VIA NEBULIZER EVERY 6 HOURS AS NEEDED FOR WHEEZING OR SHORTNESS OF BREATH 11/06/16  Yes Orlena Sheldon, PA-C  apixaban (ELIQUIS) 5 MG TABS tablet Take 1 tablet (5 mg total) by mouth 2 (two) times daily. 03/03/17 04/02/17 Yes Eber Jones, MD  budesonide (PULMICORT) 0.5 MG/2ML nebulizer solution Take 2 mLs (0.5 mg total) by nebulization 2 (two) times daily. 11/19/15  Yes Orlena Sheldon, PA-C  diltiazem (CARDIZEM CD) 240 MG 24 hr capsule Take 1 capsule (240 mg total) by mouth daily. 03/04/17  Yes Eber Jones, MD  furosemide (LASIX) 20 MG tablet Take 2 each morning. 02/12/17  Yes Dena Billet B, PA-C  insulin aspart protamine- aspart (NOVOLOG MIX 70/30) (70-30) 100 UNIT/ML injection Inject 0.18 mLs (18 Units total) into the skin daily with breakfast. 03/04/17  Yes Eber Jones, MD  insulin NPH Human (NOVOLIN N) 100 UNIT/ML injection 18 units with breakfast daily and 13 units with supper daily 03/03/17 04/02/17 Yes Eber Jones, MD  ipratropium  (ATROVENT) 0.02 % nebulizer solution Take 2.5 mLs (0.5 mg total) by nebulization 4 (four) times daily. 08/28/16  Yes Dena Billet B, PA-C  losartan (COZAAR) 100 MG tablet Take 0.5 tablets (50 mg total) by mouth daily. 03/03/17  Yes Eber Jones, MD  metFORMIN (GLUCOPHAGE) 1000 MG tablet Take 1 tablet (1,000 mg total) by mouth 2 (two) times daily. 03/13/16  Yes Orlena Sheldon, PA-C  omeprazole (PRILOSEC) 20 MG capsule Take 1 capsule (20 mg total) by mouth daily. 03/23/16  Yes Dena Billet B, PA-C  pravastatin (PRAVACHOL) 80 MG tablet Take 1 tablet (80 mg total) by mouth at bedtime. 03/13/16  Yes Orlena Sheldon, PA-C    Physical Exam: Vitals:   03/04/17 1458 03/04/17 1500 03/04/17 1501 03/04/17 1600  BP: 133/70 126/64  115/68  Pulse: (!) 128 (!) 101  97  Resp: (!) 26 20  15   Temp:   98.5 F (36.9 C)   TempSrc:   Axillary   SpO2: 96% 96%  95%  Weight: 79.3 kg (174 lb 13.2 oz)     Height: 5\' 6"  (1.676 m)       Constitutional: somnolent, on bipap Vitals:   03/04/17 1458 03/04/17 1500 03/04/17 1501 03/04/17 1600  BP: 133/70 126/64  115/68  Pulse: (!) 128 (!) 101  97  Resp: (!) 26 20  15   Temp:   98.5 F (36.9 C)   TempSrc:   Axillary   SpO2: 96% 96%  95%  Weight: 79.3 kg (174 lb 13.2 oz)     Height: 5\' 6"  (1.676 m)      Eyes: PERRL, lids and conjunctivae normal ENMT: Mucous membranes are moist. Posterior pharynx clear of any exudate or lesions.Normal dentition.  Neck: normal, supple, no masses, no thyromegaly Respiratory: no wheezing, mild crackles at bases, increased respiratory rate  Cardiovascular: irregular rate and rhythm, no murmurs / rubs / gallops. 1+ extremity edema. 2+ pedal pulses. No carotid bruits.  Abdomen: no tenderness, no masses palpated. No hepatosplenomegaly. Bowel sounds sluggish.  Musculoskeletal: no clubbing / cyanosis. No joint deformity upper and lower extremities. Good ROM, no contractures. Normal muscle tone.  Skin: no rashes, lesions, ulcers. No  induration Neurologic: unable to assess due to mental status Psychiatric: somnolent.   Labs on Admission: I have personally reviewed following labs and imaging studies  CBC:  Recent Labs Lab 02/26/17 0415 02/27/17 0418 02/28/17 0446 03/04/17 1139  WBC 14.5* 16.7* 13.9* 28.5*  NEUTROABS  --   --   --  25.4*  HGB 9.7* 10.0* 9.3* 10.2*  HCT 29.9* 31.7* 29.2* 30.8*  MCV 79.1 80.3 79.6 78.0  PLT 341 375 342 916*   Basic Metabolic Panel:  Recent Labs Lab 02/26/17 1517 02/27/17 0418 02/28/17 0446 03/01/17 0509 03/04/17 1139  NA 134* 134* 133* 136 134*  K 4.9 4.4 4.4 4.4 4.9  CL 101 102 101 101 101  CO2 25 23 22 27 23   GLUCOSE 145* 127* 203* 174* 434*  BUN 37* 35* 38* 36* 37*  CREATININE 1.35* 1.21 1.23 1.08 1.20  CALCIUM 8.2* 8.0* 8.0* 8.6* 9.9  MG 2.0  --   --   --   --    GFR: Estimated Creatinine Clearance: 49.4 mL/min (by C-G formula based on SCr of 1.2 mg/dL). Liver Function Tests: No results for input(s): AST, ALT, ALKPHOS, BILITOT, PROT, ALBUMIN in the last 168 hours. No results for input(s): LIPASE, AMYLASE in the last 168 hours. No results for input(s): AMMONIA in the last 168 hours. Coagulation Profile: No results for input(s): INR, PROTIME in the last 168 hours. Cardiac Enzymes:  Recent Labs Lab 02/25/17 2140 02/26/17 0414 02/26/17 0931 03/04/17 1139  TROPONINI 0.10* 0.23* 0.22* 0.04*   BNP (last 3 results) No results for input(s): PROBNP in the last 8760 hours. HbA1C: No results for input(s): HGBA1C in the last 72 hours. CBG:  Recent Labs Lab 03/02/17 1152 03/02/17 1637 03/02/17 2157 03/03/17 0725 03/03/17 1105  GLUCAP 240* 199* 224* 135* 101*   Lipid Profile: No results for input(s): CHOL, HDL, LDLCALC, TRIG, CHOLHDL, LDLDIRECT in the last 72 hours. Thyroid Function Tests: No results for input(s): TSH, T4TOTAL, FREET4, T3FREE, THYROIDAB in the last 72 hours. Anemia Panel: No results for input(s): VITAMINB12, FOLATE, FERRITIN, TIBC,  IRON, RETICCTPCT in the last 72 hours. Urine analysis:    Component Value Date/Time   COLORURINE YELLOW 02/25/2017 1000   APPEARANCEUR CLEAR 02/25/2017 1000   LABSPEC <1.005 (L) 02/25/2017 1000   PHURINE 5.5 02/25/2017 1000   GLUCOSEU >=500 (A) 02/25/2017 1000   HGBUR TRACE (A) 02/25/2017 1000   BILIRUBINUR NEGATIVE 02/25/2017 1000   KETONESUR NEGATIVE 02/25/2017 1000   PROTEINUR TRACE (A) 02/25/2017 1000   NITRITE NEGATIVE 02/25/2017 1000   LEUKOCYTESUR NEGATIVE 02/25/2017 1000    Radiological Exams on Admission: Dg Chest Port 1 View  Result Date: 03/04/2017 CLINICAL DATA:  Pulmonary fibrosis and pneumonia. EXAM: PORTABLE CHEST 1 VIEW COMPARISON:  CT scan 02/28/2017.  Chest x-ray 02/27/2017. FINDINGS: Focal airspace consolidation identified right mid lung. Right infrahilar airspace disease is new in the interval. There is persistent retrocardiac left base collapse/ consolidation. Underlying chronic interstitial changes suspected. Cardiopericardial silhouette is at upper limits of normal for size. The visualized bony structures of the thorax are intact. Telemetry leads overlie the chest. IMPRESSION: Multifocal airspace disease with some progression in the infrahilar right lower lobe, compatible with pneumonia. Electronically Signed   By: Misty Stanley M.D.   On: 03/04/2017 11:44    EKG: Independently reviewed. He has been in and out of atrial fibrillation  Assessment/Plan Active Problems:   Diabetes mellitus type 2, uncontrolled ([name])   COPD (chronic obstructive pulmonary disease) ([name])   Pulmonary fibrosis ([name])   Acute on chronic respiratory failure with hypoxia ([name])   Atrial fibrillation with rapid ventricular response ([name])   Pneumonia   Chronic diastolic CHF (congestive heart failure) ([place])   HCAP (healthcare-associated pneumonia)   Acute on chronic respiratory failure ([place])   1. Acute on chronic respiratory failure with hypoxia. Secondary to pneumonia in the setting of  COPD and pulmonary fibrosis. Currently on BiPAP. Will wean that down to nasal cannula as tolerated. Will consult pulmonology. 2. HCAP versus aspiration pneumonia. On last admission, there was concern for aspiration. He was seen by speech therapy who recommended dysphagia 2 diet with thin liquids. We'll continue vancomycin and Zosyn for now. 3. COPD and pulmonary fibrosis. Treat patient with bronchodilators and start on intravenous steroids. 4. Chronic diastolic CHF. Appears to be compensated. Will hold diuretics for  now. 5. Atrial fibrillation with rapid ventricular response. Frequently precipitated by underlying pulmonary condition. He is on Cardizem infusion. Will transition to oral Cardizem once mental status improves. Anticoagulation with eliquis also on hold until mental status improves. 6. Encephalopathy. Suspect this is related to hypoxia versus CO2 narcosis. We'll check ABG. Keep nothing by mouth for now until mental status improves. Continue BiPAP. 7. Diabetes. Will start on Lantus while he is nothing by mouth. Monitor blood sugars. Transition back to 70/30 once mental status improves. Continue on sliding scale insulin.  DVT prophylaxis: lovenox Code Status: DNR Family Communication: no family present Disposition Plan: pending hospital course Consults called: pulmonology Admission status: inpatient, stepdown   [facility] MD [facility] Pager [phone]  If 7PM-7AM, please contact night-coverage [email or web address] Password [facility]  03/04/2017, 4:56 PM

## 2017-03-04 NOTE — ED Notes (Signed)
Pt becoming agitated, states he needs to get out of here.  Easily redirected back to bed with family assistance.

## 2017-03-04 NOTE — Progress Notes (Addendum)
Pharmacy Antibiotic Note  Edward [name] is a 82 y.o. male admitted on 03/04/2017 with pneumonia.  Pharmacy has been consulted for Vancomycin and zosyn dosing.  Plan: Vancomycin 1500mg  loading dose, then 750mg  IV every 12 hours.  Goal trough 15-20 mcg/mL. Zosyn 3.375g IV q8h (4 hour infusion).  F/U cxs and clinical progress Monitor V/S, labs, and levels as indicated  Height: 5\' 6"  (167.6 cm) Weight: 174 lb 13.2 oz (79.3 kg) IBW/kg (Calculated) : 63.8  Temp (24hrs), Avg:98.9 F (37.2 C), Min:98.5 F (36.9 C), Max:99.2 F (37.3 C)   Recent Labs Lab 02/25/17 2140 02/26/17 0039 02/26/17 0415 02/26/17 1517 02/27/17 0418 02/28/17 0446 03/01/17 0509 03/04/17 1139  WBC  --   --  14.5*  --  16.7* 13.9*  --  28.5*  CREATININE  --   --   --  1.35* 1.21 1.23 1.08 1.20  LATICACIDVEN 2.0* 1.3  --   --   --   --   --   --     Estimated Creatinine Clearance: 49.4 mL/min (by C-G formula based on SCr of 1.2 mg/dL).    Allergies  Allergen Reactions  . Ace Inhibitors Other (See Comments)    Hyperkalemia--07/23/2013:patient states not familiar with the following allergy    Antimicrobials this admission: Vancomycin 6/3 >>  Zosyn 6/3 >>   Dose adjustments this admission: N/A  Microbiology results: 6/3 BCx: pending  Thank you for allowing pharmacy to be a part of this patient's care. Isac Sarna, BS Pharm D, [place] Clinical Pharmacist Pager [phone] 03/04/2017 5:14 PM

## 2017-03-04 NOTE — ED Notes (Signed)
Francis Pt.s son

## 2017-03-04 NOTE — ED Triage Notes (Signed)
Pt just released from ICU yesterday for respiratory infection and is not being compliant with home oxygen.  EMS arrived to find pt with sats in 70s and no oxygen being worn.  Given Albuterol x 2 en route by ems along with Solumedrol 125mg  IV.

## 2017-03-04 NOTE — ED Notes (Signed)
Pt has converted to sinus rhythm at this time.

## 2017-03-04 NOTE — ED Notes (Signed)
CRITICAL VALUE ALERT  Critical Value:  tropnin 0.04  Date & Time Notied:  1238 Provider Notified: Lacinda Axon  Orders Received/Actions taken:none

## 2017-03-04 NOTE — ED Provider Notes (Addendum)
[facility] DEPT Provider Note   CSN: [id number] Arrival date & time: 03/04/17  1108     History   Chief Complaint Chief Complaint  Patient presents with  . Shortness of Breath    HPI Edward [name] is a 82 y.o. male.  Level V caveat for urgent need for intervention. Patient was discharged from the hospital yesterday after admission on 02/25/17 per sepsis. Family reports increased confusion, walking around in his underwear, urinated on himself, increased dyspnea. He has refused oxygen at home. He has multiple health problems including atrial fibrillation with rapid ventricular response, diastolic heart failure, diabetes, hypertension, hypercholesterolemia, pulmonary fibrosis, many others. Severity of symptoms is severe.      Past Medical History:  Diagnosis Date  . Allergy    Rhinitis  . Bronchitis   . Chronic respiratory failure ([place])   . Colon polyps   . COPD (chronic obstructive pulmonary disease) ([place])   . Diabetes mellitus   . Elevated lipids   . Hypercholesterolemia   . Hypertension   . On home O2    2L N/C   . PSA elevation   . Pulmonary fibrosis ([place])   . Vitamin D deficiency     Patient Active Problem List   Diagnosis Date Noted  . Atrial fibrillation with rapid ventricular response ([place])   . Chronic diastolic heart failure ([place])   . Elevated troponin   . Sepsis ([place]) 02/25/2017  . Emesis, persistent 02/25/2017  . Constipation 02/25/2017  . Overflow diarrhea/Constipation 02/25/2017  . Rt Sided Aspiration pneumonia ([place]) 02/25/2017  . Diabetes mellitus type 2, uncontrolled, without complications ([place]) 27/03/2375  . Non compliance w medication regimen 12/02/2015  . Preseptal cellulitis 05/09/2015  . Other and unspecified hyperlipidemia 05/12/2014  . Elevated LFTs 05/12/2014  . Foot laceration 05/12/2014  . Dyspnea 12/19/2013  . Acute on chronic respiratory failure with hypoxia ([place]) 11/17/2013  . Diastolic dysfunction 28/31/5176  . COPD  exacerbation ([place]) 11/15/2013  . Atrial fibrillation ([place]) 11/10/2013  . Elevated PSA 01/20/2013  . Diabetes mellitus type 2, uncontrolled ([place])   . COPD (chronic obstructive pulmonary disease) ([place])   . Hypertension   . Allergy   . Elevated lipids   . Pulmonary fibrosis ([place])   . Bronchitis   . Colon polyps   . Colon polyps     Past Surgical History:  Procedure Laterality Date  . CATARACT EXTRACTION W/PHACO  06/25/2012   Procedure: CATARACT EXTRACTION PHACO AND INTRAOCULAR LENS PLACEMENT (IOC);  Surgeon: Elta Guadeloupe T. Gershon Crane, MD;  Location: [facility];  Service: Ophthalmology;  Laterality: Left;  CDE=19.01  . CATARACT EXTRACTION W/PHACO  07/09/2012   Procedure: CATARACT EXTRACTION PHACO AND INTRAOCULAR LENS PLACEMENT (IOC);  Surgeon: Elta Guadeloupe T. Gershon Crane, MD;  Location: [facility];  Service: Ophthalmology;  Laterality: Right;  CDE: 20.09       Home Medications    Prior to Admission medications   Medication Sig Start Date End Date Taking? Authorizing Provider  albuterol (PROVENTIL) (2.5 MG/3ML) 0.083% nebulizer solution INHALE 1 VIAL VIA NEBULIZER EVERY 6 HOURS AS NEEDED FOR WHEEZING OR SHORTNESS OF BREATH 11/06/16   Dena Billet B, PA-C  amoxicillin-clavulanate (AUGMENTIN) 875-125 MG tablet Take 1 tablet by mouth every 12 (twelve) hours. 03/03/17 03/05/17  Eber Jones, MD  apixaban (ELIQUIS) 5 MG TABS tablet Take 1 tablet (5 mg total) by mouth 2 (two) times daily. 03/03/17 04/02/17  Eber Jones, MD  azithromycin (ZITHROMAX) 250 MG tablet Take 2 tablets (500 mg total) by mouth daily.  03/03/17   Eber Jones, MD  budesonide (PULMICORT) 0.5 MG/2ML nebulizer solution Take 2 mLs (0.5 mg total) by nebulization 2 (two) times daily. 11/19/15   Orlena Sheldon, PA-C  diltiazem (CARDIZEM CD) 240 MG 24 hr capsule Take 1 capsule (240 mg total) by mouth daily. 03/04/17   Eber Jones, MD  furosemide (LASIX) 20 MG tablet Take 2 each morning. 02/12/17   Dena Billet B, PA-C  insulin aspart  protamine- aspart (NOVOLOG MIX 70/30) (70-30) 100 UNIT/ML injection Inject 0.13 mLs (13 Units total) into the skin daily with supper. 03/03/17   Eber Jones, MD  insulin aspart protamine- aspart (NOVOLOG MIX 70/30) (70-30) 100 UNIT/ML injection Inject 0.18 mLs (18 Units total) into the skin daily with breakfast. 03/04/17   Eber Jones, MD  insulin NPH Human (NOVOLIN N) 100 UNIT/ML injection 18 units with breakfast daily and 13 units with supper daily 03/03/17 04/02/17  Eber Jones, MD  ipratropium (ATROVENT) 0.02 % nebulizer solution Take 2.5 mLs (0.5 mg total) by nebulization 4 (four) times daily. 08/28/16   Orlena Sheldon, PA-C  losartan (COZAAR) 100 MG tablet Take 0.5 tablets (50 mg total) by mouth daily. 03/03/17   Eber Jones, MD  metFORMIN (GLUCOPHAGE) 1000 MG tablet Take 1 tablet (1,000 mg total) by mouth 2 (two) times daily. 03/13/16   Orlena Sheldon, PA-C  omeprazole (PRILOSEC) 20 MG capsule Take 1 capsule (20 mg total) by mouth daily. 03/23/16   Dena Billet B, PA-C  potassium chloride SA (K-DUR,KLOR-CON) 20 MEQ tablet Take 1 tablet (20 mEq total) by mouth daily. 02/12/17   Orlena Sheldon, PA-C  pravastatin (PRAVACHOL) 80 MG tablet Take 1 tablet (80 mg total) by mouth at bedtime. 03/13/16   Orlena Sheldon, PA-C    Family History Family History  Problem Relation Age of Onset  . Heart disease Mother   . CAD Other   . Diabetes Other     Social History Social History  Substance Use Topics  . Smoking status: Former Smoker    Packs/day: 1.50    Years: 60.00    Types: Cigarettes    Quit date: 12/31/2012  . Smokeless tobacco: Never Used  . Alcohol use No     Allergies   Ace inhibitors   Review of Systems Review of Systems  Reason unable to perform ROS: Urgent need for intervention.     Physical Exam Updated Vital Signs BP 121/88   Pulse (!) 172   Temp 99.2 F (37.3 C) (Axillary)   Resp (!) 28   Ht 5\' 6"  (1.676 m)   Wt 81.2 kg (179 lb)   SpO2 93%    BMI 28.89 kg/m   Physical Exam  Constitutional: He is oriented to person, place, and time.  Pale, dyspneic  HENT:  Head: Normocephalic and atraumatic.  Eyes: Conjunctivae are normal.  Neck: Neck supple.  Cardiovascular: Regular rhythm.   Tachycardic  Pulmonary/Chest:  Tachypnea, wheezing, rhonchi  Abdominal: Soft. Bowel sounds are normal.  Musculoskeletal: Normal range of motion.  Neurological: He is alert and oriented to person, place, and time.  Skin: Skin is warm and dry.  Psychiatric: He has a normal mood and affect. His behavior is normal.  Nursing note and vitals reviewed.    ED Treatments / Results  Labs (all labs ordered are listed, but only abnormal results are displayed) Labs Reviewed  CBC WITH DIFFERENTIAL/PLATELET  BASIC METABOLIC PANEL  TROPONIN I    EKG  EKG Interpretation None      Date: 03/04/2017  Rate: 113  Rhythm: sinus tachy  QRS Axis: normal  Intervals: normal  ST/T Wave abnormalities: normal  Conduction Disutrbances: none  Narrative Interpretation: unremarkable     Radiology Dg Chest Port 1 View  Result Date: 03/04/2017 CLINICAL DATA:  Pulmonary fibrosis and pneumonia. EXAM: PORTABLE CHEST 1 VIEW COMPARISON:  CT scan 02/28/2017.  Chest x-ray 02/27/2017. FINDINGS: Focal airspace consolidation identified right mid lung. Right infrahilar airspace disease is new in the interval. There is persistent retrocardiac left base collapse/ consolidation. Underlying chronic interstitial changes suspected. Cardiopericardial silhouette is at upper limits of normal for size. The visualized bony structures of the thorax are intact. Telemetry leads overlie the chest. IMPRESSION: Multifocal airspace disease with some progression in the infrahilar right lower lobe, compatible with pneumonia. Electronically Signed   By: Misty Stanley M.D.   On: 03/04/2017 11:44    Procedures Procedures (including critical care time)  Medications Ordered in ED Medications    vancomycin (VANCOCIN) 1,500 mg in sodium chloride 0.9 % 500 mL IVPB (not administered)  sodium chloride 0.9 % bolus 500 mL (500 mLs Intravenous New Bag/Given 03/04/17 1129)  ceFEPIme (MAXIPIME) 2 g in dextrose 5 % 50 mL IVPB (2 g Intravenous New Bag/Given 03/04/17 1130)  diltiazem (CARDIZEM) 100 mg in dextrose 5 % 100 mL (1 mg/mL) infusion (10 mg/hr Intravenous Rate/Dose Change 03/04/17 1143)     Initial Impression / Assessment and Plan / ED Course  I have reviewed the triage vital signs and the nursing notes.  Pertinent labs & imaging results that were available during my care of the patient were reviewed by me and considered in my medical decision making (see chart for details).    CRITICAL CARE Performed by: Nat Christen  ?  Total critical care time: 40 minutes  Critical care time was exclusive of separately billable procedures and treating other patients.  Critical care was necessary to treat or prevent imminent or life-threatening deterioration.  Critical care was time spent personally by me on the following activities: development of treatment plan with patient and/or surrogate as well as nursing, discussions with consultants, evaluation of patient's response to treatment, examination of patient, obtaining history from patient or surrogate, ordering and performing treatments and interventions, ordering and review of laboratory studies, ordering and review of radiographic studies, pulse oximetry and re-evaluation of patient's condition.  Complex patient with poorly functioning heart and lungs presents with worsening dyspnea. Chest x-ray reveals multifocal airspace disease with worsening in the right infrahilar lower lobe.  DO NOT RESUSCITATE discussion was initiated with the wife and son. They agreed that this was his wish. Sepsis protocol not followed secondary to concern of fluid overload and necessity for intubation. BiPAP and intravenous antibiotics ordered.  Final Clinical  Impressions(s) / ED Diagnoses   Final diagnoses:  Pneumonia of right lower lobe due to infectious organism [facility])    New Prescriptions New Prescriptions   No medications on file     Nat Christen, MD 03/04/17 [place]    Nat Christen, MD 03/04/17 1313

## 2017-03-04 NOTE — ED Notes (Signed)
Pt removing bipap.  Replaced and instructed to keep on.  Pt verbalized understanding.

## 2017-03-04 NOTE — ED Notes (Signed)
Pt now resting quietly and no longer attempting to get up.

## 2017-03-05 ENCOUNTER — Other Ambulatory Visit: Payer: Self-pay | Admitting: *Deleted

## 2017-03-05 ENCOUNTER — Ambulatory Visit: Payer: Self-pay | Admitting: Pharmacist

## 2017-03-05 DIAGNOSIS — G9341 Metabolic encephalopathy: Secondary | ICD-10-CM | POA: Diagnosis present

## 2017-03-05 DIAGNOSIS — I480 Paroxysmal atrial fibrillation: Secondary | ICD-10-CM | POA: Diagnosis present

## 2017-03-05 LAB — COMPREHENSIVE METABOLIC PANEL
ALT: 17 U/L (ref 17–63)
AST: 17 U/L (ref 15–41)
Albumin: 2 g/dL — ABNORMAL LOW (ref 3.5–5.0)
Alkaline Phosphatase: 73 U/L (ref 38–126)
Anion gap: 11 (ref 5–15)
BUN: 40 mg/dL — AB (ref 6–20)
CHLORIDE: 109 mmol/L (ref 101–111)
CO2: 22 mmol/L (ref 22–32)
CREATININE: 1.19 mg/dL (ref 0.61–1.24)
Calcium: 9.5 mg/dL (ref 8.9–10.3)
GFR calc Af Amer: >60 mL/min (ref >60)
GFR calc non Af Amer: 56 mL/min — ABNORMAL LOW (ref >60)
Glucose, Bld: 288 mg/dL — ABNORMAL HIGH (ref 65–99)
Potassium: 4.6 mmol/L (ref 3.5–5.1)
SODIUM: 142 mmol/L (ref 135–145)
Total Bilirubin: 0.6 mg/dL (ref 0.3–1.2)
Total Protein: 5.5 g/dL — ABNORMAL LOW (ref 6.5–8.1)

## 2017-03-05 LAB — CBC
HEMATOCRIT: 29.3 % — AB (ref 39.0–52.0)
Hemoglobin: 9.6 g/dL — ABNORMAL LOW (ref 13.0–17.0)
MCH: 25.4 pg — AB (ref 26.0–34.0)
MCHC: 32.8 g/dL (ref 30.0–36.0)
MCV: 77.5 fL — AB (ref 78.0–100.0)
PLATELETS: 419 10*3/uL — AB (ref 150–400)
RBC: 3.78 MIL/uL — AB (ref 4.22–5.81)
RDW: 17.5 % — AB (ref 11.5–15.5)
WBC: 17.9 10*3/uL — ABNORMAL HIGH (ref 4.0–10.5)

## 2017-03-05 LAB — GLUCOSE, CAPILLARY
GLUCOSE-CAPILLARY: 280 mg/dL — AB (ref 65–99)
GLUCOSE-CAPILLARY: 209 mg/dL — AB (ref 65–99)
GLUCOSE-CAPILLARY: 135 mg/dL — AB (ref 65–99)
GLUCOSE-CAPILLARY: 88 mg/dL (ref 65–99)
Glucose-Capillary: 157 mg/dL — ABNORMAL HIGH (ref 65–99)
Glucose-Capillary: 344 mg/dL — ABNORMAL HIGH (ref 65–99)

## 2017-03-05 MED ORDER — LOSARTAN POTASSIUM 50 MG PO TABS
50.0000 mg | ORAL_TABLET | Freq: Every day | ORAL | Status: DC
  Administered 2017-03-05 – 2017-03-06 (×2): 50 mg via ORAL
  Filled 2017-03-05 (×2): qty 1

## 2017-03-05 MED ORDER — FUROSEMIDE 10 MG/ML IJ SOLN
20.0000 mg | Freq: Two times a day (BID) | INTRAMUSCULAR | Status: DC
  Administered 2017-03-05 – 2017-03-09 (×8): 20 mg via INTRAVENOUS
  Filled 2017-03-05 (×9): qty 2

## 2017-03-05 MED ORDER — INSULIN ASPART 100 UNIT/ML ~~LOC~~ SOLN
4.0000 [IU] | Freq: Three times a day (TID) | SUBCUTANEOUS | Status: DC
  Administered 2017-03-05 – 2017-03-07 (×6): 4 [IU] via SUBCUTANEOUS

## 2017-03-05 MED ORDER — ALBUMIN HUMAN 25 % IV SOLN
25.0000 g | Freq: Two times a day (BID) | INTRAVENOUS | Status: AC
  Administered 2017-03-05 (×2): 25 g via INTRAVENOUS
  Filled 2017-03-05 (×2): qty 100

## 2017-03-05 MED ORDER — PANTOPRAZOLE SODIUM 40 MG PO TBEC
40.0000 mg | DELAYED_RELEASE_TABLET | Freq: Every day | ORAL | Status: DC
  Administered 2017-03-05 – 2017-03-10 (×6): 40 mg via ORAL
  Filled 2017-03-05 (×6): qty 1

## 2017-03-05 MED ORDER — APIXABAN 5 MG PO TABS
5.0000 mg | ORAL_TABLET | Freq: Two times a day (BID) | ORAL | Status: DC
  Administered 2017-03-05 – 2017-03-10 (×11): 5 mg via ORAL
  Filled 2017-03-05 (×11): qty 1

## 2017-03-05 MED ORDER — INSULIN ASPART 100 UNIT/ML ~~LOC~~ SOLN
0.0000 [IU] | Freq: Three times a day (TID) | SUBCUTANEOUS | Status: DC
  Administered 2017-03-05: 3 [IU] via SUBCUTANEOUS
  Administered 2017-03-06: 20 [IU] via SUBCUTANEOUS
  Administered 2017-03-06: 15 [IU] via SUBCUTANEOUS
  Administered 2017-03-06: 7 [IU] via SUBCUTANEOUS
  Administered 2017-03-07: 20 [IU] via SUBCUTANEOUS
  Administered 2017-03-07: 7 [IU] via SUBCUTANEOUS
  Administered 2017-03-07: 15 [IU] via SUBCUTANEOUS
  Administered 2017-03-08: 4 [IU] via SUBCUTANEOUS
  Administered 2017-03-08: 3 [IU] via SUBCUTANEOUS
  Administered 2017-03-09: 4 [IU] via SUBCUTANEOUS
  Administered 2017-03-09: 3 [IU] via SUBCUTANEOUS

## 2017-03-05 MED ORDER — DILTIAZEM HCL ER COATED BEADS 240 MG PO CP24
240.0000 mg | ORAL_CAPSULE | Freq: Every day | ORAL | Status: DC
  Administered 2017-03-05: 240 mg via ORAL
  Filled 2017-03-05 (×2): qty 1

## 2017-03-05 MED ORDER — INSULIN GLARGINE 100 UNIT/ML ~~LOC~~ SOLN
25.0000 [IU] | Freq: Every day | SUBCUTANEOUS | Status: DC
  Administered 2017-03-06: 25 [IU] via SUBCUTANEOUS
  Filled 2017-03-05 (×2): qty 0.25

## 2017-03-05 MED ORDER — VANCOMYCIN HCL IN DEXTROSE 750-5 MG/150ML-% IV SOLN
INTRAVENOUS | Status: AC
  Filled 2017-03-05: qty 150

## 2017-03-05 MED ORDER — INSULIN ASPART 100 UNIT/ML ~~LOC~~ SOLN
0.0000 [IU] | Freq: Every day | SUBCUTANEOUS | Status: DC
  Administered 2017-03-06: 3 [IU] via SUBCUTANEOUS
  Administered 2017-03-07: 2 [IU] via SUBCUTANEOUS
  Administered 2017-03-08: 3 [IU] via SUBCUTANEOUS

## 2017-03-05 MED ORDER — PRAVASTATIN SODIUM 40 MG PO TABS
80.0000 mg | ORAL_TABLET | Freq: Every day | ORAL | Status: DC
  Administered 2017-03-05 – 2017-03-09 (×5): 80 mg via ORAL
  Filled 2017-03-05 (×5): qty 2

## 2017-03-05 NOTE — Progress Notes (Signed)
Patient taken off bipap and placed on hi-flow @ 6L per RT. Patient tolerating well with O2 saturations at 94%.

## 2017-03-05 NOTE — Progress Notes (Addendum)
PROGRESS NOTE    Edward [name]  OHY:[id number] DOB: 1935/07/03 DOA: 03/04/2017 PCP: [name]    Brief Narrative:  82 year old male with a history of COPD, CHF, pulmonary fibrosis and chronic respiratory failure on 4 L of oxygen, with recently in the hospital for pneumonia and insists on being discharged home on 6/2. On 6/3, he required readmission for shortness of breath and confusion. He was found by EMS at home without any oxygen on and with oxygen saturations in the 70s. He was readmitted to the hospital for treatment of pneumonia, acute on chronic respiratory failure requiring BiPAP and rapid atrial fibrillation.   Assessment & Plan:   Active Problems:   Diabetes mellitus type 2, uncontrolled ([name])   COPD (chronic obstructive pulmonary disease) ([name])   Pulmonary fibrosis ([name])   Acute on chronic respiratory failure with hypoxia ([name])   Atrial fibrillation with rapid ventricular response ([name])   Pneumonia   Chronic diastolic CHF (congestive heart failure) ([place])   HCAP (healthcare-associated pneumonia)   Acute on chronic respiratory failure ([place])   1. Acute on chronic respiratory failure with hypoxia. Secondary to pneumonia in the setting of COPD and pulmonary fibrosis. Patient was started on bipap on admission, but appears to be improving. Will try and take off bipap today and wean down on nasal cannula. He is chronically on 4L.  2. HCAP versus aspiration pneumonia. On last admission, there was concern for aspiration. He was seen by speech therapy who recommended dysphagia 3 diet with thin liquids. We'll continue vancomycin and Zosyn for now. 3. COPD and pulmonary fibrosis. Treat patient with bronchodilators and continue on intravenous steroids. 4. Acute on Chronic diastolic CHF. Patient has significant edema and anasarca. Hypoalbuminemia likely contributing. Will restart on low dose lasix. Will give albumin infusion today. 5. Paroxysmal Atrial fibrillation with rapid  ventricular response. Likely precipitated by underlying pulmonary condition. He was initially treated with cardizem infusion, but has since converted to sinus rhythm, will continue on oral carizem. Anticoagulation with eliquis. 6. Acute metabolic Encephalopathy. Suspect this is related to hypoxia. ABG is reassuring. Mental status has improved since admission. Continue to follow. 7. Diabetes. Blood sugars are stable. Adjust lantus and start meal coverage insulin. Continue on SSI   DVT prophylaxis: eliquis Code Status: DNR Family Communication: discussed with multiple family members at the bedside Disposition Plan: discharge home once improved   Consultants:     Procedures:     Antimicrobials:   Vancomycin 6/3>>  Zosyn 6/3>>    Subjective: Feeling better. No cough. Shortness of breath improving. On bipap  Objective: Vitals:   03/05/17 0800 03/05/17 0900 03/05/17 1000 03/05/17 1100  BP: 130/63 134/61 120/89 134/67  Pulse: 85 62 83 89  Resp: (!) 22 20 16 18   Temp:      TempSrc:      SpO2: 93% 93% 97% 96%  Weight:      Height:        Intake/Output Summary (Last 24 hours) at 03/05/17 1113 Last data filed at 03/05/17 0947  Gross per 24 hour  Intake          1079.91 ml  Output              500 ml  Net           579.91 ml   Filed Weights   03/04/17 1111 03/04/17 1458 03/05/17 0500  Weight: 81.2 kg (179 lb) 79.3 kg (174 lb 13.2 oz) 80.1 kg (176 lb 9.4 oz)  Examination:  General exam: Appears calm and comfortable  Respiratory system: crackles at bases. Respiratory effort normal. Cardiovascular system: S1 & S2 heard, RRR. No JVD, murmurs, rubs, gallops or clicks. 1+ pedal edema with anasarca. Gastrointestinal system: Abdomen is distended, soft and nontender. No organomegaly or masses felt. Normal bowel sounds heard. Central nervous system: Alert and oriented. No focal neurological deficits. Extremities: Symmetric 5 x 5 power. Skin: No rashes, lesions or  ulcers Psychiatry: Judgement and insight appear normal. Mood & affect appropriate.     Data Reviewed: I have personally reviewed following labs and imaging studies  CBC:  Recent Labs Lab 02/27/17 0418 02/28/17 0446 03/04/17 1139 03/05/17 0433  WBC 16.7* 13.9* 28.5* 17.9*  NEUTROABS  --   --  25.4*  --   HGB 10.0* 9.3* 10.2* 9.6*  HCT 31.7* 29.2* 30.8* 29.3*  MCV 80.3 79.6 78.0 77.5*  PLT 375 342 605* 409*   Basic Metabolic Panel:  Recent Labs Lab 02/26/17 1517 02/27/17 0418 02/28/17 0446 03/01/17 0509 03/04/17 1139 03/05/17 0433  NA 134* 134* 133* 136 134* 142  K 4.9 4.4 4.4 4.4 4.9 4.6  CL 101 102 101 101 101 109  CO2 25 23 22 27 23 22   GLUCOSE 145* 127* 203* 174* 434* 288*  BUN 37* 35* 38* 36* 37* 40*  CREATININE 1.35* 1.21 1.23 1.08 1.20 1.19  CALCIUM 8.2* 8.0* 8.0* 8.6* 9.9 9.5  MG 2.0  --   --   --   --   --    GFR: Estimated Creatinine Clearance: 50.1 mL/min (by C-G formula based on SCr of 1.19 mg/dL). Liver Function Tests:  Recent Labs Lab 03/05/17 0433  AST 17  ALT 17  ALKPHOS 73  BILITOT 0.6  PROT 5.5*  ALBUMIN 2.0*   No results for input(s): LIPASE, AMYLASE in the last 168 hours. No results for input(s): AMMONIA in the last 168 hours. Coagulation Profile: No results for input(s): INR, PROTIME in the last 168 hours. Cardiac Enzymes:  Recent Labs Lab 03/04/17 1139  TROPONINI 0.04*   BNP (last 3 results) No results for input(s): PROBNP in the last 8760 hours. HbA1C: No results for input(s): HGBA1C in the last 72 hours. CBG:  Recent Labs Lab 03/04/17 1730 03/04/17 2026 03/05/17 0053 03/05/17 0414 03/05/17 0720  GLUCAP 385* 377* 344* 280* 209*   Lipid Profile: No results for input(s): CHOL, HDL, LDLCALC, TRIG, CHOLHDL, LDLDIRECT in the last 72 hours. Thyroid Function Tests: No results for input(s): TSH, T4TOTAL, FREET4, T3FREE, THYROIDAB in the last 72 hours. Anemia Panel: No results for input(s): VITAMINB12, FOLATE,  FERRITIN, TIBC, IRON, RETICCTPCT in the last 72 hours. Sepsis Labs: No results for input(s): PROCALCITON, LATICACIDVEN in the last 168 hours.  Recent Results (from the past 240 hour(s))  Blood Culture (routine x 2)     Status: None   Collection Time: 02/25/17  8:58 AM  Result Value Ref Range Status   Specimen Description BLOOD LEFT HAND  Final   Special Requests   Final    BOTTLES DRAWN AEROBIC AND ANAEROBIC Blood Culture adequate volume   Culture NO GROWTH 5 DAYS  Final   Report Status 03/02/2017 FINAL  Final  Blood Culture (routine x 2)     Status: None   Collection Time: 02/25/17  9:05 AM  Result Value Ref Range Status   Specimen Description LEFT ANTECUBITAL  Final   Special Requests   Final    BOTTLES DRAWN AEROBIC AND ANAEROBIC Blood Culture adequate volume  Culture NO GROWTH 5 DAYS  Final   Report Status 03/02/2017 FINAL  Final  MRSA PCR Screening     Status: Abnormal   Collection Time: 02/25/17  8:22 PM  Result Value Ref Range Status   MRSA by PCR POSITIVE (A) NEGATIVE Final    Comment:        The GeneXpert MRSA Assay (FDA approved for NASAL specimens only), is one component of a comprehensive MRSA colonization surveillance program. It is not intended to diagnose MRSA infection nor to guide or monitor treatment for MRSA infections. RESULT CALLED TO, READ BACK BY AND VERIFIED WITH: KOGER,L @ 528 BY MATTHEWS,B 02/26/17   Culture, blood (routine x 2)     Status: None (Preliminary result)   Collection Time: 03/04/17  5:24 PM  Result Value Ref Range Status   Specimen Description BLOOD LEFT HAND  Final   Special Requests   Final    Blood Culture results may not be optimal due to an inadequate volume of blood received in culture bottles   Culture PENDING  Incomplete   Report Status PENDING  Incomplete         Radiology Studies: Dg Chest Port 1 View  Result Date: 03/04/2017 CLINICAL DATA:  Pulmonary fibrosis and pneumonia. EXAM: PORTABLE CHEST 1 VIEW COMPARISON:   CT scan 02/28/2017.  Chest x-ray 02/27/2017. FINDINGS: Focal airspace consolidation identified right mid lung. Right infrahilar airspace disease is new in the interval. There is persistent retrocardiac left base collapse/ consolidation. Underlying chronic interstitial changes suspected. Cardiopericardial silhouette is at upper limits of normal for size. The visualized bony structures of the thorax are intact. Telemetry leads overlie the chest. IMPRESSION: Multifocal airspace disease with some progression in the infrahilar right lower lobe, compatible with pneumonia. Electronically Signed   By: Misty Stanley M.D.   On: 03/04/2017 11:44        Scheduled Meds: . apixaban  5 mg Oral BID  . budesonide  0.5 mg Nebulization BID  . diltiazem  240 mg Oral Daily  . furosemide  20 mg Intravenous BID  . insulin aspart  0-20 Units Subcutaneous TID WC  . insulin aspart  0-5 Units Subcutaneous QHS  . insulin glargine  20 Units Subcutaneous Daily  . ipratropium-albuterol  3 mL Nebulization Q4H  . losartan  50 mg Oral Daily  . methylPREDNISolone (SOLU-MEDROL) injection  60 mg Intravenous Q12H  . pantoprazole  40 mg Oral Daily  . pravastatin  80 mg Oral QHS   Continuous Infusions: . albumin human    . piperacillin-tazobactam (ZOSYN)  IV Stopped (03/05/17 0758)  . vancomycin Stopped (03/05/17 0330)     LOS: 1 day    Time spent: 44mins    MEMON,JEHANZEB, MD [facility] Pager [phone]  If 7PM-7AM, please contact night-coverage [email or web address] Password [email or web address] 03/05/2017, 11:13 AM

## 2017-03-05 NOTE — Patient Outreach (Signed)
[place] [facility]) Care Management  03/05/2017  CODEN FRANCHI 23-Aug-1935 [id number]  Telephone Screen  Referral Date: 02/23/17 Referral Source: MD office Dena Billet) Referral Reason: Venida Jarvis out pt assist forms for his medications Insurance:    Outreach attempt # 2 spoke with family member. Family member reported patient is in the hospital in [name]. He reported, patient was hospitalized from 02/25/17 to 03/03/17. He returned to the hospital on 03/04/17 and is currently in ICU.   Plan: RNCM will notify Hospital Liaison [facility]) about patient's recent hospitalization.    Lake Bells, RN, BSN, MHA/MSL, [place] Telephonic Care Manager Coordinator [facility] Direct Phone: [phone] Toll Free: [phone] Fax: [phone]

## 2017-03-06 ENCOUNTER — Encounter (HOSPITAL_COMMUNITY): Payer: Self-pay | Admitting: Primary Care

## 2017-03-06 DIAGNOSIS — Z515 Encounter for palliative care: Secondary | ICD-10-CM

## 2017-03-06 DIAGNOSIS — Z7189 Other specified counseling: Secondary | ICD-10-CM

## 2017-03-06 LAB — COMPREHENSIVE METABOLIC PANEL
ALK PHOS: 60 U/L (ref 38–126)
ALT: 21 U/L (ref 17–63)
ANION GAP: 7 (ref 5–15)
AST: 21 U/L (ref 15–41)
Albumin: 2.6 g/dL — ABNORMAL LOW (ref 3.5–5.0)
BILIRUBIN TOTAL: 0.5 mg/dL (ref 0.3–1.2)
BUN: 39 mg/dL — ABNORMAL HIGH (ref 6–20)
CALCIUM: 9.4 mg/dL (ref 8.9–10.3)
CO2: 27 mmol/L (ref 22–32)
Chloride: 104 mmol/L (ref 101–111)
Creatinine, Ser: 1.18 mg/dL (ref 0.61–1.24)
GFR calc non Af Amer: 57 mL/min — ABNORMAL LOW (ref >60)
Glucose, Bld: 214 mg/dL — ABNORMAL HIGH (ref 65–99)
Potassium: 3.9 mmol/L (ref 3.5–5.1)
Sodium: 138 mmol/L (ref 135–145)
TOTAL PROTEIN: 5.7 g/dL — AB (ref 6.5–8.1)

## 2017-03-06 LAB — IRON AND TIBC
IRON: 46 ug/dL (ref 45–182)
Saturation Ratios: 22 % (ref 17.9–39.5)
TIBC: 209 ug/dL — ABNORMAL LOW (ref 250–450)
UIBC: 163 ug/dL

## 2017-03-06 LAB — GLUCOSE, CAPILLARY
GLUCOSE-CAPILLARY: 217 mg/dL — AB (ref 65–99)
GLUCOSE-CAPILLARY: 354 mg/dL — AB (ref 65–99)
Glucose-Capillary: 341 mg/dL — ABNORMAL HIGH (ref 65–99)
Glucose-Capillary: 271 mg/dL — ABNORMAL HIGH (ref 65–99)

## 2017-03-06 LAB — VITAMIN B12: VITAMIN B 12: 947 pg/mL — AB (ref 180–914)

## 2017-03-06 LAB — RETICULOCYTES
RBC.: 3.43 MIL/uL — ABNORMAL LOW (ref 4.22–5.81)
RETIC COUNT ABSOLUTE: 75.5 10*3/uL (ref 19.0–186.0)
Retic Ct Pct: 2.2 % (ref 0.4–3.1)

## 2017-03-06 LAB — CBC
HCT: 26.5 % — ABNORMAL LOW (ref 39.0–52.0)
HEMOGLOBIN: 8.6 g/dL — AB (ref 13.0–17.0)
MCH: 25.6 pg — ABNORMAL LOW (ref 26.0–34.0)
MCHC: 32.5 g/dL (ref 30.0–36.0)
MCV: 78.9 fL (ref 78.0–100.0)
PLATELETS: 542 10*3/uL — AB (ref 150–400)
RBC: 3.36 MIL/uL — AB (ref 4.22–5.81)
RDW: 17.7 % — ABNORMAL HIGH (ref 11.5–15.5)
WBC: 15.9 10*3/uL — ABNORMAL HIGH (ref 4.0–10.5)

## 2017-03-06 LAB — FERRITIN: Ferritin: 122 ng/mL (ref 24–336)

## 2017-03-06 LAB — FOLATE: FOLATE: 11.5 ng/mL (ref >5.9)

## 2017-03-06 MED ORDER — DILTIAZEM HCL 100 MG IV SOLR: 10.0000 mg | Freq: Once | INTRAVENOUS | Status: DC

## 2017-03-06 MED ORDER — METOPROLOL TARTRATE 5 MG/5ML IV SOLN
INTRAVENOUS | Status: AC
  Filled 2017-03-06: qty 5

## 2017-03-06 MED ORDER — DILTIAZEM HCL 100 MG IV SOLR
5.0000 mg/h | INTRAVENOUS | Status: DC
  Administered 2017-03-06: 5 mg/h via INTRAVENOUS

## 2017-03-06 MED ORDER — IPRATROPIUM BROMIDE 0.02 % IN SOLN
0.5000 mg | RESPIRATORY_TRACT | Status: DC
  Administered 2017-03-06 (×4): 0.5 mg via RESPIRATORY_TRACT
  Filled 2017-03-06 (×4): qty 2.5

## 2017-03-06 MED ORDER — DILTIAZEM HCL 100 MG IV SOLR
INTRAVENOUS | Status: AC
  Filled 2017-03-06: qty 100

## 2017-03-06 MED ORDER — DILTIAZEM HCL ER COATED BEADS 180 MG PO CP24
360.0000 mg | ORAL_CAPSULE | Freq: Every day | ORAL | Status: DC
Start: 2017-03-07 — End: 2017-03-07

## 2017-03-06 MED ORDER — LEVALBUTEROL HCL 0.63 MG/3ML IN NEBU
0.6300 mg | INHALATION_SOLUTION | Freq: Four times a day (QID) | RESPIRATORY_TRACT | Status: DC
  Administered 2017-03-07 (×2): 0.63 mg via RESPIRATORY_TRACT
  Filled 2017-03-06 (×2): qty 3

## 2017-03-06 MED ORDER — METOPROLOL TARTRATE 5 MG/5ML IV SOLN
5.0000 mg | Freq: Once | INTRAVENOUS | Status: AC
  Administered 2017-03-06: 5 mg via INTRAVENOUS

## 2017-03-06 MED ORDER — DILTIAZEM LOAD VIA INFUSION
10.0000 mg | Freq: Once | INTRAVENOUS | Status: AC
  Administered 2017-03-06: 10 mg via INTRAVENOUS
  Filled 2017-03-06: qty 10

## 2017-03-06 MED ORDER — LEVALBUTEROL HCL 0.63 MG/3ML IN NEBU
0.6300 mg | INHALATION_SOLUTION | RESPIRATORY_TRACT | Status: DC
  Administered 2017-03-06: 0.63 mg via RESPIRATORY_TRACT
  Filled 2017-03-06: qty 3

## 2017-03-06 MED ORDER — LEVALBUTEROL HCL 0.63 MG/3ML IN NEBU
0.6300 mg | INHALATION_SOLUTION | RESPIRATORY_TRACT | Status: DC
  Administered 2017-03-06 (×3): 0.63 mg via RESPIRATORY_TRACT
  Filled 2017-03-06 (×3): qty 3

## 2017-03-06 MED ORDER — IPRATROPIUM BROMIDE 0.02 % IN SOLN
0.5000 mg | Freq: Four times a day (QID) | RESPIRATORY_TRACT | Status: DC
  Administered 2017-03-07 (×2): 0.5 mg via RESPIRATORY_TRACT
  Filled 2017-03-06 (×2): qty 2.5

## 2017-03-06 NOTE — Consult Note (Signed)
Consult requested by:Dr. Memon Consult requested for: Pneumonia/COPD exacerbation/acute on chronic respiratory failure  HPI: This is a 82 year old who came to the emergency department with increased problems with breathing. He had been in the hospital with pneumonia has known history of congestive heart failure chronic hypoxic respiratory failure and had atrial fibrillation rapid ventricular response during his last hospitalization. He says he feels well and wants to go home now. He did require BiPAP. When he came to the emergency department this time it was after he was found by EMS at home without any oxygen on an oxygen saturation in the 70s with some confusion. He had rapid atrial fib. His x-ray last time showed an extensive pneumonia. He denies any chest pain. He is unaware of the atrial fib. He says he is coughing up some sputum. No nausea vomiting abdominal pain or diarrhea  Past Medical History:  Diagnosis Date  . Allergy    Rhinitis  . Bronchitis   . Chronic respiratory failure ([place])   . Colon polyps   . COPD (chronic obstructive pulmonary disease) ([place])   . Diabetes mellitus   . Elevated lipids   . Hypercholesterolemia   . Hypertension   . On home O2    2L N/C   . PSA elevation   . Pulmonary fibrosis ([place])   . Vitamin D deficiency      Family History  Problem Relation Age of Onset  . Heart disease Mother   . CAD Other   . Diabetes Other      Social History   Social History  . Marital status: Married    Spouse name: N/A  . Number of children: N/A  . Years of education: N/A   Occupational History  . 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 Smoking status: Former Smoker    Packs/day: 1.50    Years: 60.00    Types: Cigarettes    Quit date: 12/31/2012  . Smokeless tobacco: Never Used  . Alcohol use No  . Drug use: No  . Sexual activity: Yes    Birth control/ protection: None   Other Topics Concern  . None   Social History Narrative   . None     ROS: Except as mentioned 10 point review of systems is negative    Objective: Vital signs in last 24 hours: Temp:  [98.1 F (36.7 C)-99.5 F (37.5 C)] 98.1 F (36.7 C) (06/05 0709) Pulse Rate:  [62-94] 86 (06/05 0709) Resp:  [12-29] 22 (06/05 0709) BP: (120-143)/(58-107) 138/75 (06/05 0700) SpO2:  [90 %-99 %] 94 % (06/05 0740) FiO2 (%):  [40 %] 40 % (06/05 0400) Weight change:     Intake/Output from previous day: 06/04 0701 - 06/05 0700 In: 865 [P.O.:120; I.V.:20; IV Piggyback:725] Out: 2200 [Urine:2200]  PHYSICAL EXAM Constitutional: He is awake and alert and in no acute distress. Eyes: Pupils react EOMI. Ears nose mouth and throat: His mucous membranes are moist. His hearing is grossly normal. Cardiovascular: His heart is not regular. He doesn't have a gallop. Respiratory: He has bilateral rhonchi and wheezing but is moving air pretty well. Gastrointestinal: His abdomen is soft with no masses. Musculoskeletal: His strength is normal. Neurological: No focal abnormalities psychiatric: Normal mood and affect. Skin: Warm and dry  Lab Results: Basic Metabolic Panel:  Recent Labs  03/05/17 0433 03/06/17 0410  NA 142 138  K 4.6 3.9  CL 109 104  CO2 22 27  GLUCOSE  288* 214*  BUN 40* 39*  CREATININE 1.19 1.18  CALCIUM 9.5 9.4   Liver Function Tests:  Recent Labs  03/05/17 0433 03/06/17 0410  AST 17 21  ALT 17 21  ALKPHOS 73 60  BILITOT 0.6 0.5  PROT 5.5* 5.7*  ALBUMIN 2.0* 2.6*   No results for input(s): LIPASE, AMYLASE in the last 72 hours. No results for input(s): AMMONIA in the last 72 hours. CBC:  Recent Labs  03/04/17 1139 03/05/17 0433 03/06/17 0410  WBC 28.5* 17.9* 15.9*  NEUTROABS 25.4*  --   --   HGB 10.2* 9.6* 8.6*  HCT 30.8* 29.3* 26.5*  MCV 78.0 77.5* 78.9  PLT 605* 419* 542*   Cardiac Enzymes:  Recent Labs  03/04/17 1139  TROPONINI 0.04*   BNP: No results for input(s): PROBNP in the last 72 hours. D-Dimer: No  results for input(s): DDIMER in the last 72 hours. CBG:  Recent Labs  03/05/17 0414 03/05/17 0720 03/05/17 1134 03/05/17 1624 03/05/17 2140 03/06/17 0708  GLUCAP 280* 209* 135* 88 157* 217*   Hemoglobin A1C: No results for input(s): HGBA1C in the last 72 hours. Fasting Lipid Panel: No results for input(s): CHOL, HDL, LDLCALC, TRIG, CHOLHDL, LDLDIRECT in the last 72 hours. Thyroid Function Tests: No results for input(s): TSH, T4TOTAL, FREET4, T3FREE, THYROIDAB in the last 72 hours. Anemia Panel: No results for input(s): VITAMINB12, FOLATE, FERRITIN, TIBC, IRON, RETICCTPCT in the last 72 hours. Coagulation: No results for input(s): LABPROT, INR in the last 72 hours. Urine Drug Screen: Drugs of Abuse  No results found for: LABOPIA, COCAINSCRNUR, LABBENZ, AMPHETMU, THCU, LABBARB  Alcohol Level: No results for input(s): ETH in the last 72 hours. Urinalysis: No results for input(s): COLORURINE, LABSPEC, PHURINE, GLUCOSEU, HGBUR, BILIRUBINUR, KETONESUR, PROTEINUR, UROBILINOGEN, NITRITE, LEUKOCYTESUR in the last 72 hours.  Invalid input(s): APPERANCEUR Misc. Labs:   ABGS:  Recent Labs  03/04/17 1815  PHART 7.385  PO2ART 90.6  HCO3 24.3     MICROBIOLOGY: Recent Results (from the past 240 hour(s))  Blood Culture (routine x 2)     Status: None   Collection Time: 02/25/17  8:58 AM  Result Value Ref Range Status   Specimen Description BLOOD LEFT HAND  Final   Special Requests   Final    BOTTLES DRAWN AEROBIC AND ANAEROBIC Blood Culture adequate volume   Culture NO GROWTH 5 DAYS  Final   Report Status 03/02/2017 FINAL  Final  Blood Culture (routine x 2)     Status: None   Collection Time: 02/25/17  9:05 AM  Result Value Ref Range Status   Specimen Description LEFT ANTECUBITAL  Final   Special Requests   Final    BOTTLES DRAWN AEROBIC AND ANAEROBIC Blood Culture adequate volume   Culture NO GROWTH 5 DAYS  Final   Report Status 03/02/2017 FINAL  Final  MRSA PCR  Screening     Status: Abnormal   Collection Time: 02/25/17  8:22 PM  Result Value Ref Range Status   MRSA by PCR POSITIVE (A) NEGATIVE Final    Comment:        The GeneXpert MRSA Assay (FDA approved for NASAL specimens only), is one component of a comprehensive MRSA colonization surveillance program. It is not intended to diagnose MRSA infection nor to guide or monitor treatment for MRSA infections. RESULT CALLED TO, READ BACK BY AND VERIFIED WITH: KOGER,L @ 741 BY MATTHEWS,B 02/26/17   Culture, blood (routine x 2)     Status: None (Preliminary result)  Collection Time: 03/04/17 11:52 AM  Result Value Ref Range Status   Specimen Description BLOOD LEFT ARM  Final   Special Requests   Final    Blood Culture results may not be optimal due to an inadequate volume of blood received in culture bottles   Culture NO GROWTH 2 DAYS  Final   Report Status PENDING  Incomplete  Culture, blood (routine x 2)     Status: None (Preliminary result)   Collection Time: 03/04/17  5:24 PM  Result Value Ref Range Status   Specimen Description BLOOD LEFT HAND  Final   Special Requests   Final    Blood Culture results may not be optimal due to an inadequate volume of blood received in culture bottles   Culture NO GROWTH 2 DAYS  Final   Report Status PENDING  Incomplete    Studies/Results: Dg Chest Port 1 View  Result Date: 03/04/2017 CLINICAL DATA:  Pulmonary fibrosis and pneumonia. EXAM: PORTABLE CHEST 1 VIEW COMPARISON:  CT scan 02/28/2017.  Chest x-ray 02/27/2017. FINDINGS: Focal airspace consolidation identified right mid lung. Right infrahilar airspace disease is new in the interval. There is persistent retrocardiac left base collapse/ consolidation. Underlying chronic interstitial changes suspected. Cardiopericardial silhouette is at upper limits of normal for size. The visualized bony structures of the thorax are intact. Telemetry leads overlie the chest. IMPRESSION: Multifocal airspace disease  with some progression in the infrahilar right lower lobe, compatible with pneumonia. Electronically Signed   By: Misty Stanley M.D.   On: 03/04/2017 11:44    Medications:  Prior to Admission:  Prescriptions Prior to Admission  Medication Sig Dispense Refill Last Dose  . albuterol (PROVENTIL) (2.5 MG/3ML) 0.083% nebulizer solution INHALE 1 VIAL VIA NEBULIZER EVERY 6 HOURS AS NEEDED FOR WHEEZING OR SHORTNESS OF BREATH 360 mL 5 unknown at unknown  . apixaban (ELIQUIS) 5 MG TABS tablet Take 1 tablet (5 mg total) by mouth 2 (two) times daily. 60 tablet 0 03/04/2017 at Unknown time  . budesonide (PULMICORT) 0.5 MG/2ML nebulizer solution Take 2 mLs (0.5 mg total) by nebulization 2 (two) times daily. 120 mL 5 unknown at unknown  . diltiazem (CARDIZEM CD) 240 MG 24 hr capsule Take 1 capsule (240 mg total) by mouth daily. 30 capsule 0 unknown at unknown  . furosemide (LASIX) 20 MG tablet Take 2 each morning. 60 tablet 3 Past Week at Unknown time  . insulin aspart protamine- aspart (NOVOLOG MIX 70/30) (70-30) 100 UNIT/ML injection Inject 0.18 mLs (18 Units total) into the skin daily with breakfast. 10 mL 11 unknown at unknown  . insulin NPH Human (NOVOLIN N) 100 UNIT/ML injection 18 units with breakfast daily and 13 units with supper daily 10 mL 0 03/04/2017 at Unknown time  . ipratropium (ATROVENT) 0.02 % nebulizer solution Take 2.5 mLs (0.5 mg total) by nebulization 4 (four) times daily. 25 mL 12 unknown at unknown  . losartan (COZAAR) 100 MG tablet Take 0.5 tablets (50 mg total) by mouth daily. 30 tablet 5 Past Week at Unknown time  . metFORMIN (GLUCOPHAGE) 1000 MG tablet Take 1 tablet (1,000 mg total) by mouth 2 (two) times daily. 60 tablet 5 Past Week at Unknown time  . omeprazole (PRILOSEC) 20 MG capsule Take 1 capsule (20 mg total) by mouth daily. 30 capsule 3 Past Week at Unknown time  . pravastatin (PRAVACHOL) 80 MG tablet Take 1 tablet (80 mg total) by mouth at bedtime. 30 tablet 5 Past Week at Unknown  time  . [  DISCONTINUED] amoxicillin-clavulanate (AUGMENTIN) 875-125 MG tablet Take 1 tablet by mouth every 12 (twelve) hours. 4 tablet 0 03/04/2017 at Unknown time  . [DISCONTINUED] azithromycin (ZITHROMAX) 250 MG tablet Take 2 tablets (500 mg total) by mouth daily. 6 each 0 03/04/2017 at Unknown time   Scheduled: . apixaban  5 mg Oral BID  . budesonide  0.5 mg Nebulization BID  . diltiazem  240 mg Oral Daily  . furosemide  20 mg Intravenous BID  . insulin aspart  0-20 Units Subcutaneous TID WC  . insulin aspart  0-5 Units Subcutaneous QHS  . insulin aspart  4 Units Subcutaneous TID WC  . insulin glargine  25 Units Subcutaneous Daily  . ipratropium-albuterol  3 mL Nebulization Q4H  . losartan  50 mg Oral Daily  . methylPREDNISolone (SOLU-MEDROL) injection  60 mg Intravenous Q12H  . pantoprazole  40 mg Oral Daily  . pravastatin  80 mg Oral QHS   Continuous: . diltiazem (CARDIZEM) infusion 5 mg/hr (03/06/17 0803)  . piperacillin-tazobactam (ZOSYN)  IV 3.375 g (03/06/17 0450)  . vancomycin Stopped (03/06/17 0349)   ASN:[id number] **OR** acetaminophen, ondansetron **OR** ondansetron (ZOFRAN) IV  Assesment: He has healthcare associated pneumonia and is still being treated for that. He has COPD exacerbation as well. He has acute on chronic hypoxic respiratory failure and was found without his oxygen on. He had metabolic encephalopathy which is better. He has paroxysmal atrial fibrillation and when he came to the emergency room he had A. fib with rapid ventricular response. He required BiPAP but now is on nasal oxygen although at high flow. Active Problems:   Diabetes mellitus type 2, uncontrolled ([name])   COPD (chronic obstructive pulmonary disease) ([name])   Pulmonary fibrosis ([name])   Acute on chronic respiratory failure with hypoxia ([name])   Atrial fibrillation with rapid ventricular response ([name])   Pneumonia   Chronic diastolic CHF (congestive heart failure) ([name])   HCAP  (healthcare-associated pneumonia)   Acute on chronic respiratory failure ([name])   AF (paroxysmal atrial fibrillation) ([name])   Acute metabolic encephalopathy    Plan: Continue treatments. I question whether he will be compliant when he gets home    LOS: 2 days   Aleathea Pugmire L 03/06/2017, 8:45 AM

## 2017-03-06 NOTE — Consult Note (Signed)
Consultation Note Date: 03/06/2017   Patient Name: KENYATA GUESS  DOB: 1935-05-12  MRN: [id number]  Age / Sex: 82 y.o., male  PCP: Orlena Sheldon, PA-C Referring Physician: Kathie Dike, MD  Reason for Consultation: Establishing goals of care and Psychosocial/spiritual support  HPI/Patient Profile: 82 y.o. male  with past medical history of COPD 4 L home oxygen dependent, pulmonary fibrosis, increased cholesterol and hypertension, diabetes insulin-dependent admitted on 03/04/2017 with acute on chronic respiratory failure with hypoxia secondary to pneumonia with COPD and pulmonary fibrosis.   Clinical Assessment and Goals of Care: Mr. Chain is sitting up in the [place] chair in his room. He greets me making but not keeping eye contact. There is no family present at this time. He is oriented to person and place but is confused about his age and the year. I share that his heart rate is increased and ask if he can feel his heart beating fast in his chest, he says that he cannot. I ask how he feels about his breathing and he states, "okay". He shares that the lung doctor told him this morning that he was doing okay, and I shared "okay for you", and he agrees.   We talk about Mr. Knappenberger home life. 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 He shares that he cooks his own oatmeal on the stove. Mr. Balboa states that he and his wife usually go get groceries together. He has been married for 45+ years, has twins sons [name] and Marguerite Olea and 2 stepsons. I share that I will come back to talk with him when his family arrives.  Later in the day wife, Victormanuel Mclure arrives with sons [name] and Marguerite Olea. We meet outside of the room to talk about Mr. Pizano chronic and acute health problems. I share a diagram of the chronic illness pathway, and we talk about what is normal and  expected. I share labs including details about albumin. I share my worry over continued decline and chronic illness. [name] states that Mr. Ikard has been spending most of his time on the couch sleeping. She states multiple times that she feels he should get up and go outside to work on his lawnmowers. I state, also several times, that I believe this ability has left him due to his chronic lung disease. I ask what worries Mrs. Reeves the most, and she states "the way he eats". She shares that he will cook 6 boiled eggs for breakfast, but throws away the egg white and eats only the yoke.  We talk about the importance of albumin in healing and recovery. Darrell states that his goal is for his father to live long as possible, but he is aware that his father has chosen behaviors that are not helpful.  We talk about prognosis with permission. I share the normal expected signs of sleeping more, eating and interacting less, as we near end of life. I share with family  that I would not be surprised if Mr. Umar were to pass in 6 months or less. We talk about the benefits of hospice at home including an RN to come visit, an aide to help with bathing, social worker and chaplain, all at home at no cost. I share the constraints of treating in place (not coming back to the hospital). We talk about Mr. champions preferred place of death. Darrell states he feels his father's preferred place of death is home, Mrs. Donahue at first states she feels preferred place of death would be hospital. She later amends this to be at home. The goal is to get Mr. Cefalu over at this illness and return home with the benefits of hospice of [facility]. Mr. Flanigan sister is asks how long until he is discharged. When I share that he will be here at least a few more days she states, "that long!". I share with the family that if Mr. Friedt wanted to go home, even if it meant he passed away within 2 weeks, then I would do my  best to get him home. I share that the goal at this point is longevity, and that he needs to stay long enough to be stable. Family agrees.   Healthcare power of attorney NEXT OF KIN - Mr. Weniger states that he wishes for his wife [name] and then his son [name] to make health care decisions if he is unable. He also has a son Darrell, and 2 stepsons.   SUMMARY OF RECOMMENDATIONS   continue to treat the treatable with the goal of returning home with the benefits of hospice of [facility].   Code Status/Advance Care Planning:  DNR - discussed with wife and sons [name] and Marguerite Olea  Symptom Management:   per hospitalist and Dr. Luan Pulling. No additional needs at this time. Recommend morphine for dyspnea and anxiety lyrics as Mr. Krager improves.   Palliative Prophylaxis:   Aspiration  Additional Recommendations (Limitations, Scope, Preferences):  Continue to treat the treatable but no extraordinary measure such as CPR or intubation.  Psycho-social/Spiritual:   Desire for further Chaplaincy support:no  Additional Recommendations: Caregiving  Support/Resources and Education on Hospice  Prognosis:   < 6 months, would not be surprising based on frailty, albumin of 2.016/4/18, COPD oxygen dependent on 4 L, pulmonary fibrosis.   Discharge Planning: Goal is to return home with the benefits of hospice of [facility]. The goal is to extend life/quality of life.      Primary Diagnoses: Present on Admission: . Pneumonia . Acute on chronic respiratory failure with hypoxia ([place]) . Atrial fibrillation with rapid ventricular response ([place]) . COPD (chronic obstructive pulmonary disease) ([place]) . Diabetes mellitus type 2, uncontrolled ([place]) . Pulmonary fibrosis ([place]) . Chronic diastolic CHF (congestive heart failure) ([place]) . Acute on chronic respiratory failure ([place]) . AF (paroxysmal atrial fibrillation) ([place]) . Acute metabolic encephalopathy   I have reviewed the medical  record, interviewed the patient and family, and examined the patient. The following aspects are pertinent.  Past Medical History:  Diagnosis Date  . Allergy    Rhinitis  . Bronchitis   . Chronic respiratory failure ([place])   . Colon polyps   . COPD (chronic obstructive pulmonary disease) ([place])   . Diabetes mellitus   . Elevated lipids   . Hypercholesterolemia   . Hypertension   . On home O2    2L N/C   . PSA elevation   . Pulmonary fibrosis ([place])   . Vitamin  D deficiency    Social History   Social History  . Marital status: Married    Spouse name: N/A  . Number of children: N/A  . Years of education: N/A   Occupational History  . 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 Smoking status: Former Smoker    Packs/day: 1.50    Years: 60.00    Types: Cigarettes    Quit date: 12/31/2012  . Smokeless tobacco: Never Used  . Alcohol use No  . Drug use: No  . Sexual activity: Yes    Birth control/ protection: None   Other Topics Concern  . None   Social History Narrative  . None   Family History  Problem Relation Age of Onset  . Heart disease Mother   . CAD Other   . Diabetes Other    Scheduled Meds: . apixaban  5 mg Oral BID  . budesonide  0.5 mg Nebulization BID  . diltiazem  240 mg Oral Daily  . furosemide  20 mg Intravenous BID  . insulin aspart  0-20 Units Subcutaneous TID WC  . insulin aspart  0-5 Units Subcutaneous QHS  . insulin aspart  4 Units Subcutaneous TID WC  . insulin glargine  25 Units Subcutaneous Daily  . ipratropium  0.5 mg Nebulization Q4H  . levalbuterol  0.63 mg Nebulization Q4H  . losartan  50 mg Oral Daily  . methylPREDNISolone (SOLU-MEDROL) injection  60 mg Intravenous Q12H  . pantoprazole  40 mg Oral Daily  . pravastatin  80 mg Oral QHS   Continuous Infusions: . diltiazem (CARDIZEM) infusion 10 mg/hr (03/06/17 1046)  . piperacillin-tazobactam (ZOSYN)  IV Stopped (03/06/17 0850)  . vancomycin Stopped (03/06/17  0349)   PRN Meds:.acetaminophen **OR** acetaminophen, ondansetron **OR** ondansetron (ZOFRAN) IV Medications Prior to Admission:  Prior to Admission medications   Medication Sig Start Date End Date Taking? Authorizing Provider  albuterol (PROVENTIL) (2.5 MG/3ML) 0.083% nebulizer solution INHALE 1 VIAL VIA NEBULIZER EVERY 6 HOURS AS NEEDED FOR WHEEZING OR SHORTNESS OF BREATH 11/06/16  Yes Orlena Sheldon, PA-C  apixaban (ELIQUIS) 5 MG TABS tablet Take 1 tablet (5 mg total) by mouth 2 (two) times daily. 03/03/17 04/02/17 Yes Eber Jones, MD  budesonide (PULMICORT) 0.5 MG/2ML nebulizer solution Take 2 mLs (0.5 mg total) by nebulization 2 (two) times daily. 11/19/15  Yes Orlena Sheldon, PA-C  diltiazem (CARDIZEM CD) 240 MG 24 hr capsule Take 1 capsule (240 mg total) by mouth daily. 03/04/17  Yes Eber Jones, MD  furosemide (LASIX) 20 MG tablet Take 2 each morning. 02/12/17  Yes Dena Billet B, PA-C  insulin aspart protamine- aspart (NOVOLOG MIX 70/30) (70-30) 100 UNIT/ML injection Inject 0.18 mLs (18 Units total) into the skin daily with breakfast. 03/04/17  Yes Eber Jones, MD  insulin NPH Human (NOVOLIN N) 100 UNIT/ML injection 18 units with breakfast daily and 13 units with supper daily 03/03/17 04/02/17 Yes Eber Jones, MD  ipratropium (ATROVENT) 0.02 % nebulizer solution Take 2.5 mLs (0.5 mg total) by nebulization 4 (four) times daily. 08/28/16  Yes Dena Billet B, PA-C  losartan (COZAAR) 100 MG tablet Take 0.5 tablets (50 mg total) by mouth daily. 03/03/17  Yes Eber Jones, MD  metFORMIN (GLUCOPHAGE) 1000 MG tablet Take 1 tablet (1,000 mg total) by mouth 2 (two) times daily. 03/13/16  Yes Orlena Sheldon, PA-C  omeprazole (PRILOSEC) 20 MG capsule Take 1 capsule (20 mg  total) by mouth daily. 03/23/16  Yes Dena Billet B, PA-C  pravastatin (PRAVACHOL) 80 MG tablet Take 1 tablet (80 mg total) by mouth at bedtime. 03/13/16  Yes Orlena Sheldon, PA-C   Allergies  Allergen  Reactions  . Ace Inhibitors Other (See Comments)    Hyperkalemia--07/23/2013:patient states not familiar with the following allergy   Review of Systems  Unable to perform ROS: Mental status change    Physical Exam  Constitutional: No distress.  Appears frail and chronically/acutely ill  HENT:  Head: Atraumatic.  Some temporal wasting  Cardiovascular:  Rate 140s, irregular on 1st visit. 2nd visit rate 80s regular on Cardizem drip  Pulmonary/Chest: No respiratory distress.  Mild work of breathing noted. Able to hold conversation  Abdominal: Soft.  Musculoskeletal: He exhibits edema.  Neurological: He is alert.  Oriented to person and place, not year  Skin: Skin is warm and dry.  Nursing note and vitals reviewed.   Vital Signs: BP 138/75   Pulse 88   Temp 98.1 F (36.7 C) (Oral)   Resp (!) 21   Ht 5\' 6"  (1.676 m)   Wt 80.1 kg (176 lb 9.4 oz)   SpO2 94%   BMI 28.50 kg/m  Pain Assessment: No/denies pain   Pain Score: 0-No pain   SpO2: SpO2: 94 % O2 Device:SpO2: 94 % O2 Flow Rate: .O2 Flow Rate (L/min): 6 L/min  IO: Intake/output summary:  Intake/Output Summary (Last 24 hours) at 03/06/17 1052 Last data filed at 03/06/17 0600  Gross per 24 hour  Intake              865 ml  Output             2200 ml  Net            -1335 ml    LBM:   Baseline Weight: Weight: 81.2 kg (179 lb) Most recent weight: Weight: 80.1 kg (176 lb 9.4 oz)     Palliative Assessment/Data:   Flowsheet Rows     Most Recent Value  Intake Tab  Referral Department  Hospitalist  Unit at Time of Referral  ICU  Palliative Care Primary Diagnosis  Pulmonary  Date Notified  03/06/17  Palliative Care Type  New Palliative care  Reason for referral  Clarify Goals of Care  Date of Admission  03/04/17  Date first seen by Palliative Care  03/06/17  # of days Palliative referral response time  0 Day(s)  # of days IP prior to Palliative referral  2  Clinical Assessment  Palliative Performance Scale  Score  30%  Pain Max last 24 hours  Not able to report  Pain Min Last 24 hours  Not able to report  Dyspnea Max Last 24 Hours  Not able to report  Dyspnea Min Last 24 hours  Not able to report  Psychosocial & Spiritual Assessment  Palliative Care Outcomes  Patient/Family meeting held?  Yes  Who was at the meeting?  Patient only initially  Palliative Care Outcomes  Provided psychosocial or spiritual support, Provided advance care planning  Patient/Family wishes: Interventions discontinued/not started   Mechanical Ventilation      Time In:     0950    1340 Time Out: 1010     1430 Time Total:  20         50  = 70 minutes total  Greater than 50%  of this time was spent counseling and coordinating care related to the above assessment and  plan.  Signed by: Drue Novel, NP   Please contact Palliative Medicine Team phone at [phone] for questions and concerns.  For individual provider: See Shea Evans

## 2017-03-06 NOTE — Progress Notes (Signed)
Patient does not wish to use BIPAP tonight. Unit at bedside if needed. Discussed plan with RN and he is in agreement. Patient is stable respiratory wise and resting well on 2 lpm nasal cannula.

## 2017-03-06 NOTE — Progress Notes (Signed)
PROGRESS NOTE    Edward [name]  XNA:[id number] DOB: 09-09-1935 DOA: 03/04/2017 PCP: [name]    Brief Narrative:  82 year old male with a history of COPD, CHF, pulmonary fibrosis and chronic respiratory failure on 4 L of oxygen, was recently in the hospital for pneumonia and insisted on being discharged home on 6/2. On 6/3, he required readmission for shortness of breath and confusion. He was found by EMS at home without any oxygen on and with oxygen saturations in the 70s. He was readmitted to the hospital for treatment of pneumonia, acute on chronic respiratory failure requiring BiPAP and rapid atrial fibrillation.   Assessment & Plan:   Active Problems:   Diabetes mellitus type 2, uncontrolled ([name])   COPD (chronic obstructive pulmonary disease) ([name])   Pulmonary fibrosis ([name])   Acute on chronic respiratory failure with hypoxia ([name])   Atrial fibrillation with rapid ventricular response ([name])   Pneumonia   Chronic diastolic CHF (congestive heart failure) ([name])   HCAP (healthcare-associated pneumonia)   Acute on chronic respiratory failure ([name])   AF (paroxysmal atrial fibrillation) ([name])   Acute metabolic encephalopathy   1. Acute on chronic respiratory failure with hypoxia. Secondary to pneumonia in the setting of COPD and pulmonary fibrosis. Patient was started on bipap on admission, but appears to be improving. He did require bipap overnight, but now on [place]. Pulmonology consulted. He is chronically on 4L.  2. HCAP versus aspiration pneumonia. On last admission, there was concern for aspiration. He was seen by speech therapy who recommended dysphagia 3 diet with thin liquids. We'll continue vancomycin and Zosyn for now. 3. COPD and pulmonary fibrosis. Treat patient with bronchodilators and continue on intravenous steroids. 4. Acute on Chronic diastolic CHF. Patient has significant edema and anasarca. Hypoalbuminemia likely contributing. Continue on lasix. 5. Paroxysmal  Atrial fibrillation with rapid ventricular response. Likely precipitated by underlying pulmonary condition. He initially converted back to sinus rhythm with cardizem infusion, but went back into rapid atrial fibrillation this morning. Will start back on cardizem infusion. Anticoagulation with eliquis. 6. Acute metabolic Encephalopathy. Suspect this was related to hypoxia. ABG was reassuring. Mental status has improved since admission. Continue to follow. 7. Diabetes. Blood sugars are stable. Continue lantus and meal coverage insulin. Continue on SSI 8. Anemia. Chronic. No evidence of bleeding. Check anemia panel and stool occult blood   DVT prophylaxis: eliquis Code Status: DNR Family Communication: no family present today Disposition Plan: discharge home once improved. Patient has multiple medical problems. Will consult palliative care to address goals of care.   Consultants:     Procedures:     Antimicrobials:   Vancomycin 6/3>>  Zosyn 6/3>>    Subjective: Sitting up in chair. Denies any chest pain or worsening shortness of breath. Denies any palpitations. Heart rate went back into rapid atrial fibrillation this morning with heart rates 150-170s.  Objective: Vitals:   03/06/17 0700 03/06/17 0709 03/06/17 0735 03/06/17 0740  BP: 138/75     Pulse: 92 86    Resp: (!) 24 (!) 22    Temp:  98.1 F (36.7 C)    TempSrc:  Oral    SpO2: 99% 99% 92% 94%  Weight:      Height:        Intake/Output Summary (Last 24 hours) at 03/06/17 0825 Last data filed at 03/06/17 0600  Gross per 24 hour  Intake              865 ml  Output  2200 ml  Net            -1335 ml   Filed Weights   03/04/17 1111 03/04/17 1458 03/05/17 0500  Weight: 81.2 kg (179 lb) 79.3 kg (174 lb 13.2 oz) 80.1 kg (176 lb 9.4 oz)    Examination:  General exam: Appears calm and comfortable  Respiratory system: crackles at bases. Respiratory effort normal. Cardiovascular system: S1 & S2 heard,  irregular. No JVD, murmurs, rubs, gallops or clicks. 1+ pedal edema with anasarca. Gastrointestinal system: Abdomen is distended, soft and nontender. No organomegaly or masses felt. Normal bowel sounds heard. Central nervous system: Alert and oriented. No focal neurological deficits. Extremities: Symmetric 5 x 5 power. Skin: No rashes, lesions or ulcers Psychiatry: Judgement and insight appear normal. Mood & affect appropriate.     Data Reviewed: I have personally reviewed following labs and imaging studies  CBC:  Recent Labs Lab 02/28/17 0446 03/04/17 1139 03/05/17 0433 03/06/17 0410  WBC 13.9* 28.5* 17.9* 15.9*  NEUTROABS  --  25.4*  --   --   HGB 9.3* 10.2* 9.6* 8.6*  HCT 29.2* 30.8* 29.3* 26.5*  MCV 79.6 78.0 77.5* 78.9  PLT 342 605* 419* 073*   Basic Metabolic Panel:  Recent Labs Lab 02/28/17 0446 03/01/17 0509 03/04/17 1139 03/05/17 0433 03/06/17 0410  NA 133* 136 134* 142 138  K 4.4 4.4 4.9 4.6 3.9  CL 101 101 101 109 104  CO2 22 27 23 22 27   GLUCOSE 203* 174* 434* 288* 214*  BUN 38* 36* 37* 40* 39*  CREATININE 1.23 1.08 1.20 1.19 1.18  CALCIUM 8.0* 8.6* 9.9 9.5 9.4   GFR: Estimated Creatinine Clearance: 50.5 mL/min (by C-G formula based on SCr of 1.18 mg/dL). Liver Function Tests:  Recent Labs Lab 03/05/17 0433 03/06/17 0410  AST 17 21  ALT 17 21  ALKPHOS 73 60  BILITOT 0.6 0.5  PROT 5.5* 5.7*  ALBUMIN 2.0* 2.6*   No results for input(s): LIPASE, AMYLASE in the last 168 hours. No results for input(s): AMMONIA in the last 168 hours. Coagulation Profile: No results for input(s): INR, PROTIME in the last 168 hours. Cardiac Enzymes:  Recent Labs Lab 03/04/17 1139  TROPONINI 0.04*   BNP (last 3 results) No results for input(s): PROBNP in the last 8760 hours. HbA1C: No results for input(s): HGBA1C in the last 72 hours. CBG:  Recent Labs Lab 03/05/17 0720 03/05/17 1134 03/05/17 1624 03/05/17 2140 03/06/17 0708  GLUCAP 209* 135* 88  157* 217*   Lipid Profile: No results for input(s): CHOL, HDL, LDLCALC, TRIG, CHOLHDL, LDLDIRECT in the last 72 hours. Thyroid Function Tests: No results for input(s): TSH, T4TOTAL, FREET4, T3FREE, THYROIDAB in the last 72 hours. Anemia Panel: No results for input(s): VITAMINB12, FOLATE, FERRITIN, TIBC, IRON, RETICCTPCT in the last 72 hours. Sepsis Labs: No results for input(s): PROCALCITON, LATICACIDVEN in the last 168 hours.  Recent Results (from the past 240 hour(s))  Blood Culture (routine x 2)     Status: None   Collection Time: 02/25/17  8:58 AM  Result Value Ref Range Status   Specimen Description BLOOD LEFT HAND  Final   Special Requests   Final    BOTTLES DRAWN AEROBIC AND ANAEROBIC Blood Culture adequate volume   Culture NO GROWTH 5 DAYS  Final   Report Status 03/02/2017 FINAL  Final  Blood Culture (routine x 2)     Status: None   Collection Time: 02/25/17  9:05 AM  Result Value Ref  Range Status   Specimen Description LEFT ANTECUBITAL  Final   Special Requests   Final    BOTTLES DRAWN AEROBIC AND ANAEROBIC Blood Culture adequate volume   Culture NO GROWTH 5 DAYS  Final   Report Status 03/02/2017 FINAL  Final  MRSA PCR Screening     Status: Abnormal   Collection Time: 02/25/17  8:22 PM  Result Value Ref Range Status   MRSA by PCR POSITIVE (A) NEGATIVE Final    Comment:        The GeneXpert MRSA Assay (FDA approved for NASAL specimens only), is one component of a comprehensive MRSA colonization surveillance program. It is not intended to diagnose MRSA infection nor to guide or monitor treatment for MRSA infections. RESULT CALLED TO, READ BACK BY AND VERIFIED WITH: KOGER,L @ 955 BY MATTHEWS,B 02/26/17   Culture, blood (routine x 2)     Status: None (Preliminary result)   Collection Time: 03/04/17 11:52 AM  Result Value Ref Range Status   Specimen Description BLOOD LEFT ARM  Final   Special Requests   Final    Blood Culture results may not be optimal due to an  inadequate volume of blood received in culture bottles   Culture NO GROWTH 2 DAYS  Final   Report Status PENDING  Incomplete  Culture, blood (routine x 2)     Status: None (Preliminary result)   Collection Time: 03/04/17  5:24 PM  Result Value Ref Range Status   Specimen Description BLOOD LEFT HAND  Final   Special Requests   Final    Blood Culture results may not be optimal due to an inadequate volume of blood received in culture bottles   Culture NO GROWTH 2 DAYS  Final   Report Status PENDING  Incomplete         Radiology Studies: Dg Chest Port 1 View  Result Date: 03/04/2017 CLINICAL DATA:  Pulmonary fibrosis and pneumonia. EXAM: PORTABLE CHEST 1 VIEW COMPARISON:  CT scan 02/28/2017.  Chest x-ray 02/27/2017. FINDINGS: Focal airspace consolidation identified right mid lung. Right infrahilar airspace disease is new in the interval. There is persistent retrocardiac left base collapse/ consolidation. Underlying chronic interstitial changes suspected. Cardiopericardial silhouette is at upper limits of normal for size. The visualized bony structures of the thorax are intact. Telemetry leads overlie the chest. IMPRESSION: Multifocal airspace disease with some progression in the infrahilar right lower lobe, compatible with pneumonia. Electronically Signed   By: Misty Stanley M.D.   On: 03/04/2017 11:44        Scheduled Meds: . apixaban  5 mg Oral BID  . budesonide  0.5 mg Nebulization BID  . diltiazem  240 mg Oral Daily  . furosemide  20 mg Intravenous BID  . insulin aspart  0-20 Units Subcutaneous TID WC  . insulin aspart  0-5 Units Subcutaneous QHS  . insulin aspart  4 Units Subcutaneous TID WC  . insulin glargine  25 Units Subcutaneous Daily  . ipratropium-albuterol  3 mL Nebulization Q4H  . losartan  50 mg Oral Daily  . methylPREDNISolone (SOLU-MEDROL) injection  60 mg Intravenous Q12H  . pantoprazole  40 mg Oral Daily  . pravastatin  80 mg Oral QHS   Continuous Infusions: .  diltiazem (CARDIZEM) infusion 5 mg/hr (03/06/17 0803)  . piperacillin-tazobactam (ZOSYN)  IV 3.375 g (03/06/17 0450)  . vancomycin Stopped (03/06/17 0349)     LOS: 2 days    Time spent: 8mins    MEMON,JEHANZEB, MD [facility] Pager [phone]  If 7PM-7AM, please contact night-coverage [email or web address] Password [email or web address] 03/06/2017, 8:25 AM

## 2017-03-07 DIAGNOSIS — J9621 Acute and chronic respiratory failure with hypoxia: Principal | ICD-10-CM

## 2017-03-07 DIAGNOSIS — I4891 Unspecified atrial fibrillation: Secondary | ICD-10-CM

## 2017-03-07 DIAGNOSIS — J189 Pneumonia, unspecified organism: Secondary | ICD-10-CM

## 2017-03-07 DIAGNOSIS — J8410 Pulmonary fibrosis, unspecified: Secondary | ICD-10-CM

## 2017-03-07 DIAGNOSIS — J9620 Acute and chronic respiratory failure, unspecified whether with hypoxia or hypercapnia: Secondary | ICD-10-CM

## 2017-03-07 DIAGNOSIS — N183 Chronic kidney disease, stage 3 unspecified: Secondary | ICD-10-CM

## 2017-03-07 DIAGNOSIS — E1165 Type 2 diabetes mellitus with hyperglycemia: Secondary | ICD-10-CM

## 2017-03-07 DIAGNOSIS — R0902 Hypoxemia: Secondary | ICD-10-CM

## 2017-03-07 DIAGNOSIS — Z794 Long term (current) use of insulin: Secondary | ICD-10-CM

## 2017-03-07 DIAGNOSIS — G9341 Metabolic encephalopathy: Secondary | ICD-10-CM

## 2017-03-07 DIAGNOSIS — J181 Lobar pneumonia, unspecified organism: Secondary | ICD-10-CM

## 2017-03-07 LAB — BASIC METABOLIC PANEL
Anion gap: 7 (ref 5–15)
BUN: 41 mg/dL — AB (ref 6–20)
CO2: 28 mmol/L (ref 22–32)
CREATININE: 1.14 mg/dL (ref 0.61–1.24)
Calcium: 9.4 mg/dL (ref 8.9–10.3)
Chloride: 100 mmol/L — ABNORMAL LOW (ref 101–111)
GFR calc Af Amer: >60 mL/min (ref >60)
GFR, EST NON AFRICAN AMERICAN: 59 mL/min — AB (ref >60)
GLUCOSE: 211 mg/dL — AB (ref 65–99)
Potassium: 4.1 mmol/L (ref 3.5–5.1)
Sodium: 135 mmol/L (ref 135–145)

## 2017-03-07 LAB — CBC
HCT: 29.3 % — ABNORMAL LOW (ref 39.0–52.0)
Hemoglobin: 9.5 g/dL — ABNORMAL LOW (ref 13.0–17.0)
MCH: 25.5 pg — ABNORMAL LOW (ref 26.0–34.0)
MCHC: 32.4 g/dL (ref 30.0–36.0)
MCV: 78.6 fL (ref 78.0–100.0)
PLATELETS: 637 10*3/uL — AB (ref 150–400)
RBC: 3.73 MIL/uL — ABNORMAL LOW (ref 4.22–5.81)
RDW: 17.6 % — ABNORMAL HIGH (ref 11.5–15.5)
WBC: 17.6 10*3/uL — AB (ref 4.0–10.5)

## 2017-03-07 LAB — GLUCOSE, CAPILLARY
Glucose-Capillary: 236 mg/dL — ABNORMAL HIGH (ref 65–99)
Glucose-Capillary: 324 mg/dL — ABNORMAL HIGH (ref 65–99)
Glucose-Capillary: 360 mg/dL — ABNORMAL HIGH (ref 65–99)
Glucose-Capillary: 223 mg/dL — ABNORMAL HIGH (ref 65–99)

## 2017-03-07 LAB — VANCOMYCIN, TROUGH: Vancomycin Tr: 18 ug/mL (ref 15–20)

## 2017-03-07 MED ORDER — LEVALBUTEROL HCL 0.63 MG/3ML IN NEBU
0.6300 mg | INHALATION_SOLUTION | Freq: Three times a day (TID) | RESPIRATORY_TRACT | Status: DC
  Administered 2017-03-07 – 2017-03-10 (×8): 0.63 mg via RESPIRATORY_TRACT
  Filled 2017-03-07 (×8): qty 3

## 2017-03-07 MED ORDER — INSULIN ASPART 100 UNIT/ML ~~LOC~~ SOLN
8.0000 [IU] | Freq: Three times a day (TID) | SUBCUTANEOUS | Status: DC
  Administered 2017-03-07 – 2017-03-09 (×7): 8 [IU] via SUBCUTANEOUS

## 2017-03-07 MED ORDER — METOPROLOL TARTRATE 25 MG PO TABS
25.0000 mg | ORAL_TABLET | Freq: Two times a day (BID) | ORAL | Status: DC
  Administered 2017-03-07 – 2017-03-10 (×7): 25 mg via ORAL
  Filled 2017-03-07 (×7): qty 1

## 2017-03-07 MED ORDER — IPRATROPIUM BROMIDE 0.02 % IN SOLN
0.5000 mg | Freq: Three times a day (TID) | RESPIRATORY_TRACT | Status: DC
  Administered 2017-03-07 – 2017-03-10 (×8): 0.5 mg via RESPIRATORY_TRACT
  Filled 2017-03-07 (×8): qty 2.5

## 2017-03-07 MED ORDER — DILTIAZEM HCL ER COATED BEADS 120 MG PO CP24
120.0000 mg | ORAL_CAPSULE | Freq: Every day | ORAL | Status: DC
  Administered 2017-03-07 – 2017-03-08 (×2): 120 mg via ORAL
  Filled 2017-03-07 (×2): qty 1

## 2017-03-07 MED ORDER — INSULIN GLARGINE 100 UNIT/ML ~~LOC~~ SOLN
35.0000 [IU] | Freq: Every day | SUBCUTANEOUS | Status: DC
  Administered 2017-03-07 – 2017-03-09 (×3): 35 [IU] via SUBCUTANEOUS
  Filled 2017-03-07 (×6): qty 0.35

## 2017-03-07 MED ORDER — ORAL CARE MOUTH RINSE
15.0000 mL | Freq: Two times a day (BID) | OROMUCOSAL | Status: DC
  Administered 2017-03-07 – 2017-03-10 (×7): 15 mL via OROMUCOSAL

## 2017-03-07 MED ORDER — DILTIAZEM HCL 100 MG IV SOLR
5.0000 mg/h | INTRAVENOUS | Status: DC
  Administered 2017-03-07: 15 mg/h via INTRAVENOUS
  Filled 2017-03-07 (×2): qty 100

## 2017-03-07 NOTE — Progress Notes (Signed)
Daily Progress Note   Patient Name: Edward [name]       Date: 03/07/2017 DOB: 1934/10/11  Age: 82 y.o. MRN#: [id number] Attending Physician: Orson Eva, MD Primary Care Physician: [name] Admit Date: 03/04/2017  Reason for Consultation/Follow-up: Establishing goals of care and Psychosocial/spiritual support  Subjective: Edward [name] is resting quietly in his [place] chair. He makes but does not keep eye contact as I enter the room. There is no family of bedside at this time. We talk about his breathing and heart problems. Edward [name] states that he does not understand why he is having heart problems, his heart was fine prior to the hospitalization he states. We talk about the strain on his longings, and he states he has pneumonia. I share that he is getting sicker.   Edward [name] states he wants to return home. I ask him if he returned home too soon and died, would that be okay.  He states that it would not. I encourage him to stay and seek full treatment. He agrees.  Call to wife [name] and son [name]. I speak with son [name], and share that Edward [name]'s heart rate has been up-and-down. No questions or concerns at this time. [name] states they will be at the hospital 6/7 between 1230 and 1p.  Length of Stay: 3  Current Medications: Scheduled Meds:  . apixaban  5 mg Oral BID  . budesonide  0.5 mg Nebulization BID  . diltiazem  120 mg Oral Daily  . furosemide  20 mg Intravenous BID  . insulin aspart  0-20 Units Subcutaneous TID WC  . insulin aspart  0-5 Units Subcutaneous QHS  . insulin aspart  8 Units Subcutaneous TID WC  . insulin glargine  35 Units Subcutaneous Daily  . ipratropium  0.5 mg Nebulization TID  . levalbuterol  0.63 mg Nebulization TID  . mouth rinse  15 mL Mouth  Rinse BID  . methylPREDNISolone (SOLU-MEDROL) injection  60 mg Intravenous Q12H  . metoprolol tartrate  25 mg Oral BID  . pantoprazole  40 mg Oral Daily  . pravastatin  80 mg Oral QHS    Continuous Infusions: . piperacillin-tazobactam (ZOSYN)  IV 3.375 g (03/07/17 1210)  . vancomycin 750 mg (03/07/17 1428)    PRN Meds: acetaminophen **OR** acetaminophen, ondansetron **OR** ondansetron (ZOFRAN)  IV  Physical Exam  Constitutional: No distress.  Appears frail, chronically ill  HENT:  Head: Atraumatic.  Cardiovascular:  Early am A fib, rate140s  Pulmonary/Chest: No respiratory distress.  Abdominal: Soft. He exhibits no distension.  Musculoskeletal: He exhibits no edema.  Neurological: He is alert.  Skin: Skin is warm and dry.  Nursing note and vitals reviewed.           Vital Signs: BP (!) 141/72   Pulse 85   Temp 97.9 F (36.6 C) (Oral)   Resp (!) 23   Ht 5\' 6"  (1.676 m)   Wt 80.1 kg (176 lb 9.4 oz)   SpO2 100%   BMI 28.50 kg/m  SpO2: SpO2: 100 % O2 Device: O2 Device: Nasal Cannula O2 Flow Rate: O2 Flow Rate (L/min): 2 L/min  Intake/output summary:  Intake/Output Summary (Last 24 hours) at 03/07/17 1433 Last data filed at 03/07/17 1300  Gross per 24 hour  Intake           682.83 ml  Output             3502 ml  Net         -2819.17 ml   LBM: Last BM Date: 03/07/17 Baseline Weight: Weight: 81.2 kg (179 lb) Most recent weight: Weight: 80.1 kg (176 lb 9.4 oz)       Palliative Assessment/Data:    Flowsheet Rows     Most Recent Value  Intake Tab  Referral Department  Hospitalist  Unit at Time of Referral  ICU  Palliative Care Primary Diagnosis  Pulmonary  Date Notified  03/06/17  Palliative Care Type  New Palliative care  Reason for referral  Clarify Goals of Care  Date of Admission  03/04/17  Date first seen by Palliative Care  03/06/17  # of days Palliative referral response time  0 Day(s)  # of days IP prior to Palliative referral  2  Clinical  Assessment  Palliative Performance Scale Score  30%  Pain Max last 24 hours  Not able to report  Pain Min Last 24 hours  Not able to report  Dyspnea Max Last 24 Hours  Not able to report  Dyspnea Min Last 24 hours  Not able to report  Psychosocial & Spiritual Assessment  Palliative Care Outcomes  Patient/Family meeting held?  Yes  Who was at the meeting?  Patient only initially  Palliative Care Outcomes  Provided psychosocial or spiritual support, Provided advance care planning  Patient/Family wishes: Interventions discontinued/not started   Mechanical Ventilation      Patient Active Problem List   Diagnosis Date Noted  . Uncontrolled type 2 diabetes mellitus with hyperglycemia, with long-term current use of insulin ([place]) 03/07/2017  . CKD (chronic kidney disease), stage III 03/07/2017  . Lobar pneumonia ([place]) 03/07/2017  . Palliative care encounter   . Goals of care, counseling/discussion   . Encounter for hospice care discussion   . AF (paroxysmal atrial fibrillation) ([place]) 03/05/2017  . Acute metabolic encephalopathy 81/82/9937  . Pneumonia 03/04/2017  . Chronic diastolic CHF (congestive heart failure) ([place]) 03/04/2017  . HCAP (healthcare-associated pneumonia) 03/04/2017  . Acute on chronic respiratory failure ([place]) 03/04/2017  . Atrial fibrillation with rapid ventricular response ([place])   . Chronic diastolic heart failure ([place])   . Elevated troponin   . Sepsis ([place]) 02/25/2017  . Emesis, persistent 02/25/2017  . Constipation 02/25/2017  . Overflow diarrhea/Constipation 02/25/2017  . Rt Sided Aspiration pneumonia ([place]) 02/25/2017  . Diabetes mellitus type 2,  uncontrolled, without complications ([place]) 14/78/2956  . Non compliance w medication regimen 12/02/2015  . Preseptal cellulitis 05/09/2015  . Other and unspecified hyperlipidemia 05/12/2014  . Elevated LFTs 05/12/2014  . Foot laceration 05/12/2014  . Dyspnea 12/19/2013  . Acute on chronic respiratory failure with  hypoxia ([place]) 11/17/2013  . Diastolic dysfunction 21/30/8657  . COPD exacerbation ([place]) 11/15/2013  . Atrial fibrillation ([place]) 11/10/2013  . Elevated PSA 01/20/2013  . Diabetes mellitus type 2, uncontrolled ([place])   . COPD (chronic obstructive pulmonary disease) ([place])   . Hypertension   . Allergy   . Elevated lipids   . Pulmonary fibrosis ([place])   . Bronchitis   . Colon polyps   . Colon polyps     Palliative Care Assessment & Plan   Patient Profile: 82 y.o. male  with past medical history of COPD 4 L home oxygen dependent, pulmonary fibrosis, increased cholesterol and hypertension, diabetes insulin-dependent admitted on 03/04/2017 with acute on chronic respiratory failure with hypoxia secondary to pneumonia with COPD and pulmonary fibrosis.   Assessment: Acute on chronic respiratory failure; COPD exacerbation, acute on chronic heart failure, likely aspiration pneumonia and pulmonary fibrosis. Oxygen at 4 L via nasal cannula at baseline. Treating with vancomycin and Zosyn.   Recommendations/Plan:  Continue to treat the treatable with the goal of some recovery, then returned home with the benefits of hospice of [facility].  Goals of Care and Additional Recommendations:  Limitations on Scope of Treatment: Continue to treat the treatable but no CPR or intubation, okay with BiPAP  Code Status:    Code Status Orders        Start     Ordered   03/04/17 1642  Do not attempt resuscitation (DNR)  Continuous    Question Answer Comment  In the event of cardiac or respiratory ARREST Do not call a "code blue"   In the event of cardiac or respiratory ARREST Do not perform Intubation, CPR, defibrillation or ACLS   In the event of cardiac or respiratory ARREST Use medication by any route, position, wound care, and other measures to relive pain and suffering. May use oxygen, suction and manual treatment of airway obstruction as needed for comfort.      03/04/17 1654    Code Status  History    Date Active Date Inactive Code Status Order ID Comments User Context   03/04/2017 12:52 PM 03/04/2017  4:54 PM DNR [id number]  Nat Christen, MD ED   02/25/2017 12:49 PM 03/03/2017  7:51 PM Full Code [id number]  Roxan Hockey, MD Inpatient   07/24/2016 10:39 AM 07/26/2016  6:05 PM Full Code [id number]  Kathie Dike, MD Inpatient   05/09/2015  8:51 PM 05/10/2015  5:05 PM Full Code [id number]  Phillips Grout, MD Inpatient   11/16/2013 12:20 AM 11/17/2013  5:05 PM Full Code [id number]  Oswald Hillock, MD Inpatient       Prognosis:   < 6 months would not be surprising based on frailty, albumin of 2.016/4/18, COPD oxygen dependent on 4 L, pulmonary fibrosis.   Discharge Planning:  Goal is to return home with the benefits of hospice of [facility].   The goal is to extend life/quality of life.   Care plan was discussed with nursing staff, case manager, Dr. Luan Pulling and Dr. Carles Collet.  Thank you for allowing the Palliative Medicine Team to assist in the care of this patient.   Time In: 1310 Time Out: 1435 Total Time 25 minutes  Prolonged Time Billed  no       Greater than 50%  of this time was spent counseling and coordinating care related to the above assessment and plan.  Drue Novel, NP  Please contact Palliative Medicine Team phone at [phone] for questions and concerns.

## 2017-03-07 NOTE — Progress Notes (Signed)
Pharmacy Antibiotic Note  Edward [name] is a 82 y.o. male admitted on 03/04/2017 with pneumonia.  Pharmacy has been consulted for Vancomycin and zosyn dosing. Day 4 of Vancomycin,  trough reported as 21mcg/ml, therapeutic.  Plan: Continue Vancomycin 750mg  IV every 12 hours.  Goal trough 15-20 mcg/mL. Zosyn 3.375g IV q8h (4 hour infusion). F/U cxs and clinical progress and LOT Monitor V/S, labs, and levels as indicated  Height: 5\' 6"  (167.6 cm) Weight: 176 lb 9.4 oz (80.1 kg) IBW/kg (Calculated) : 63.8  Temp (24hrs), Avg:98.1 F (36.7 C), Min:97.8 F (36.6 C), Max:98.4 F (36.9 C)   Recent Labs Lab 03/01/17 0509 03/04/17 1139 03/05/17 0433 03/06/17 0410 03/07/17 0434 03/07/17 1313  WBC  --  28.5* 17.9* 15.9* 17.6*  --   CREATININE 1.08 1.20 1.19 1.18 1.14  --   VANCOTROUGH  --   --   --   --   --  18    Estimated Creatinine Clearance: 52.2 mL/min (by C-G formula based on SCr of 1.14 mg/dL).    Allergies  Allergen Reactions  . Ace Inhibitors Other (See Comments)    Hyperkalemia--07/23/2013:patient states not familiar with the following allergy    Antimicrobials this admission: Vancomycin 6/3 >>  Zosyn 6/3 >>   Dose adjustments this admission: N/A  Microbiology results: 6/3 BCx: ngtd 02/25/17: MRS PCR is positive  Thank you for allowing pharmacy to be a part of this patient's care. Isac Sarna, BS Pharm D, [place] Clinical Pharmacist Pager [phone] 03/07/2017 2:03 PM

## 2017-03-07 NOTE — Progress Notes (Signed)
Inpatient Diabetes Program Recommendations  AACE/ADA: New Consensus Statement on Inpatient Glycemic Control (2015)  Target Ranges:  Prepandial:   less than 140 mg/dL      Peak postprandial:   less than 180 mg/dL (1-2 hours)      Critically ill patients:  140 - 180 mg/dL  Results for JAYVEON, CONVEY (MRN [id number]) as of 03/07/2017 07:39  Ref. Range 03/07/2017 04:34  Glucose Latest Ref Range: 65 - 99 mg/dL 211 (H)   Results for REISE, GLADNEY (MRN [id number]) as of 03/07/2017 07:39  Ref. Range 03/06/2017 07:08 03/06/2017 12:08 03/06/2017 16:22 03/06/2017 21:18  Glucose-Capillary Latest Ref Range: 65 - 99 mg/dL 217 (H) 341 (H) 354 (H) 271 (H)   Review of Glycemic Control  Current orders for Inpatient glycemic control: Lantus 25 units daily, Novolog 0-20 units TID with meals, Novolog 0-5 units QHS, Novolog 4 units TID with meals  Inpatient Diabetes Program Recommendations: Insulin - Basal: Please consider increasing Lantus to 30 units daily. Insulin - Meal Coverage: If steroids are continued, please consider increasing meal coverage to Novolog 10 units TID wtih meals for meal coverage.  Thanks, Barnie Alderman, RN, MSN, CDE Diabetes Coordinator Inpatient Diabetes Program [phone] (Team Pager from 8am to 5pm)

## 2017-03-07 NOTE — [place] Notes (Signed)
Subjective: He was admitted with another episode of acute on chronic hypoxic respiratory failure congestive heart failure and atrial fibrillation rapid ventricular response. He is in atrial fib with rapid ventricular response again and is on diltiazem continuous infusion now. He says he thinks his breathing is okay. He has no symptoms from the atrial fib. He unfortunately has very little insight into his illnesses.  Objective: Vital signs in last 24 hours: Temp:  [97.4 F (36.3 C)-98.4 F (36.9 C)] 98.2 F (36.8 C) (06/06 0400) Pulse Rate:  [58-152] 143 (06/06 0800) Resp:  [16-33] 26 (06/06 0800) BP: (104-168)/(50-86) 135/80 (06/06 0800) SpO2:  [90 %-100 %] 98 % (06/06 0844) Weight change:  Last BM Date: 03/03/17  Intake/Output from previous day: 06/05 0701 - 06/06 0700 In: 442.8 [I.V.:67.8; IV Piggyback:375] Out: 2200 [Urine:2200]  PHYSICAL EXAM General appearance: alert and no distress Resp: rhonchi bilaterally Cardio: irregularly irregular rhythm GI: soft, non-tender; bowel sounds normal; no masses,  no organomegaly Extremities: extremities normal, atraumatic, no cyanosis or edema  Lab Results:  Results for orders placed or performed during the hospital encounter of 03/04/17 (from the past 48 hour(s))  Glucose, capillary     Status: Abnormal   Collection Time: 03/05/17 11:34 AM  Result Value Ref Range   Glucose-Capillary 135 (H) 65 - 99 mg/dL  Glucose, capillary     Status: None   Collection Time: 03/05/17  4:24 PM  Result Value Ref Range   Glucose-Capillary 88 65 - 99 mg/dL  Glucose, capillary     Status: Abnormal   Collection Time: 03/05/17  9:40 PM  Result Value Ref Range   Glucose-Capillary 157 (H) 65 - 99 mg/dL  Comprehensive metabolic panel     Status: Abnormal   Collection Time: 03/06/17  4:10 AM  Result Value Ref Range   Sodium 138 135 - 145 mmol/L   Potassium 3.9 3.5 - 5.1 mmol/L   Chloride 104 101 - 111 mmol/L   CO2 27 22 - 32 mmol/L   Glucose, Bld 214  (H) 65 - 99 mg/dL   BUN 39 (H) 6 - 20 mg/dL   Creatinine, Ser 1.18 0.61 - 1.24 mg/dL   Calcium 9.4 8.9 - 10.3 mg/dL   Total Protein 5.7 (L) 6.5 - 8.1 g/dL   Albumin 2.6 (L) 3.5 - 5.0 g/dL   AST 21 15 - 41 U/L   ALT 21 17 - 63 U/L   Alkaline Phosphatase 60 38 - 126 U/L   Total Bilirubin 0.5 0.3 - 1.2 mg/dL   GFR calc non Af Amer 57 (L) >60 mL/min   GFR calc Af Amer >60 >60 mL/min    Comment: (NOTE) The eGFR has been calculated using the CKD EPI equation. This calculation has not been validated in all clinical situations. eGFR's persistently <60 mL/min signify possible Chronic Kidney Disease.    Anion gap 7 5 - 15  CBC     Status: Abnormal   Collection Time: 03/06/17  4:10 AM  Result Value Ref Range   WBC 15.9 (H) 4.0 - 10.5 K/uL   RBC 3.36 (L) 4.22 - 5.81 MIL/uL   Hemoglobin 8.6 (L) 13.0 - 17.0 g/dL   HCT 26.5 (L) 39.0 - 52.0 %   MCV 78.9 78.0 - 100.0 fL   MCH 25.6 (L) 26.0 - 34.0 pg   MCHC 32.5 30.0 - 36.0 g/dL   RDW 17.7 (H) 11.5 - 15.5 %   Platelets 542 (H) 150 - 400 K/uL  Glucose, capillary  Status: Abnormal   Collection Time: 03/06/17  7:08 AM  Result Value Ref Range   Glucose-Capillary 217 (H) 65 - 99 mg/dL  Vitamin B12     Status: Abnormal   Collection Time: 03/06/17  9:49 AM  Result Value Ref Range   Vitamin B-12 947 (H) 180 - 914 pg/mL    Comment: (NOTE) This assay is not validated for testing neonatal or myeloproliferative syndrome specimens for Vitamin B12 levels. Performed at [facility], [place] [address] [place], [place] [address]   Folate     Status: None   Collection Time: 03/06/17  9:49 AM  Result Value Ref Range   Folate 11.5 >5.9 ng/mL    Comment: Performed at [place] [address] [place], [place] [address]  Iron and TIBC     Status: Abnormal   Collection Time: 03/06/17  9:49 AM  Result Value Ref Range   Iron 46 45 - 182 ug/dL   TIBC 209 (L) 250 - 450 ug/dL   Saturation Ratios 22 17.9 - 39.5 %   UIBC 163 ug/dL     Comment: Performed at [facility], [place] [address] [place], [place] [address]  Ferritin     Status: None   Collection Time: 03/06/17  9:49 AM  Result Value Ref Range   Ferritin 122 24 - 336 ng/mL    Comment: Performed at [place] [address], [place], [place] [address]  Reticulocytes     Status: Abnormal   Collection Time: 03/06/17  9:49 AM  Result Value Ref Range   Retic Ct Pct 2.2 0.4 - 3.1 %   RBC. 3.43 (L) 4.22 - 5.81 MIL/uL   Retic Count, Manual 75.5 19.0 - 186.0 K/uL  Glucose, capillary     Status: Abnormal   Collection Time: 03/06/17 12:08 PM  Result Value Ref Range   Glucose-Capillary 341 (H) 65 - 99 mg/dL  Glucose, capillary     Status: Abnormal   Collection Time: 03/06/17  4:22 PM  Result Value Ref Range   Glucose-Capillary 354 (H) 65 - 99 mg/dL  Glucose, capillary     Status: Abnormal   Collection Time: 03/06/17  9:18 PM  Result Value Ref Range   Glucose-Capillary 271 (H) 65 - 99 mg/dL  Basic metabolic panel     Status: Abnormal   Collection Time: 03/07/17  4:34 AM  Result Value Ref Range   Sodium 135 135 - 145 mmol/L   Potassium 4.1 3.5 - 5.1 mmol/L   Chloride 100 (L) 101 - 111 mmol/L   CO2 28 22 - 32 mmol/L   Glucose, Bld 211 (H) 65 - 99 mg/dL   BUN 41 (H) 6 - 20 mg/dL   Creatinine, Ser 1.14 0.61 - 1.24 mg/dL   Calcium 9.4 8.9 - 10.3 mg/dL   GFR calc non Af Amer 59 (L) >60 mL/min   GFR calc Af Amer >60 >60 mL/min    Comment: (NOTE) The eGFR has been calculated using the CKD EPI equation. This calculation has not been validated in all clinical situations. eGFR's persistently <60 mL/min signify possible Chronic Kidney Disease.    Anion gap 7 5 - 15  CBC     Status: Abnormal   Collection Time: 03/07/17  4:34 AM  Result Value Ref Range   WBC 17.6 (H) 4.0 - 10.5 K/uL   RBC 3.73 (L) 4.22 - 5.81 MIL/uL   Hemoglobin 9.5 (L) 13.0 - 17.0 g/dL  HCT 29.3 (L) 39.0 - 52.0 %   MCV 78.6 78.0 - 100.0 fL   MCH 25.5 (L) 26.0 - 34.0 pg   MCHC 32.4  30.0 - 36.0 g/dL   RDW 17.6 (H) 11.5 - 15.5 %   Platelets 637 (H) 150 - 400 K/uL  Glucose, capillary     Status: Abnormal   Collection Time: 03/07/17  7:47 AM  Result Value Ref Range   Glucose-Capillary 236 (H) 65 - 99 mg/dL    ABGS  Recent Labs  03/04/17 1815  PHART 7.385  PO2ART 90.6  HCO3 24.3   CULTURES Recent Results (from the past 240 hour(s))  MRSA PCR Screening     Status: Abnormal   Collection Time: 02/25/17  8:22 PM  Result Value Ref Range Status   MRSA by PCR POSITIVE (A) NEGATIVE Final    Comment:        The GeneXpert MRSA Assay (FDA approved for NASAL specimens only), is one component of a comprehensive MRSA colonization surveillance program. It is not intended to diagnose MRSA infection nor to guide or monitor treatment for MRSA infections. RESULT CALLED TO, READ BACK BY AND VERIFIED WITH: KOGER,L @ 955 BY MATTHEWS,B 02/26/17   Culture, blood (routine x 2)     Status: None (Preliminary result)   Collection Time: 03/04/17 11:52 AM  Result Value Ref Range Status   Specimen Description BLOOD LEFT ARM  Final   Special Requests   Final    Blood Culture results may not be optimal due to an inadequate volume of blood received in culture bottles   Culture NO GROWTH 3 DAYS  Final   Report Status PENDING  Incomplete  Culture, blood (routine x 2)     Status: None (Preliminary result)   Collection Time: 03/04/17  5:24 PM  Result Value Ref Range Status   Specimen Description BLOOD LEFT HAND  Final   Special Requests   Final    Blood Culture results may not be optimal due to an inadequate volume of blood received in culture bottles   Culture NO GROWTH 3 DAYS  Final   Report Status PENDING  Incomplete   Studies/Results: No results found.  Medications:  Prior to Admission:  Prescriptions Prior to Admission  Medication Sig Dispense Refill Last Dose  . albuterol (PROVENTIL) (2.5 MG/3ML) 0.083% nebulizer solution INHALE 1 VIAL VIA NEBULIZER EVERY 6 HOURS AS  NEEDED FOR WHEEZING OR SHORTNESS OF BREATH 360 mL 5 unknown at unknown  . apixaban (ELIQUIS) 5 MG TABS tablet Take 1 tablet (5 mg total) by mouth 2 (two) times daily. 60 tablet 0 03/04/2017 at Unknown time  . budesonide (PULMICORT) 0.5 MG/2ML nebulizer solution Take 2 mLs (0.5 mg total) by nebulization 2 (two) times daily. 120 mL 5 unknown at unknown  . diltiazem (CARDIZEM CD) 240 MG 24 hr capsule Take 1 capsule (240 mg total) by mouth daily. 30 capsule 0 unknown at unknown  . furosemide (LASIX) 20 MG tablet Take 2 each morning. 60 tablet 3 Past Week at Unknown time  . insulin aspart protamine- aspart (NOVOLOG MIX 70/30) (70-30) 100 UNIT/ML injection Inject 0.18 mLs (18 Units total) into the skin daily with breakfast. 10 mL 11 unknown at unknown  . insulin NPH Human (NOVOLIN N) 100 UNIT/ML injection 18 units with breakfast daily and 13 units with supper daily 10 mL 0 03/04/2017 at Unknown time  . ipratropium (ATROVENT) 0.02 % nebulizer solution Take 2.5 mLs (0.5 mg total) by nebulization 4 (four) times  daily. 25 mL 12 unknown at unknown  . losartan (COZAAR) 100 MG tablet Take 0.5 tablets (50 mg total) by mouth daily. 30 tablet 5 Past Week at Unknown time  . metFORMIN (GLUCOPHAGE) 1000 MG tablet Take 1 tablet (1,000 mg total) by mouth 2 (two) times daily. 60 tablet 5 Past Week at Unknown time  . omeprazole (PRILOSEC) 20 MG capsule Take 1 capsule (20 mg total) by mouth daily. 30 capsule 3 Past Week at Unknown time  . pravastatin (PRAVACHOL) 80 MG tablet Take 1 tablet (80 mg total) by mouth at bedtime. 30 tablet 5 Past Week at Unknown time  . [DISCONTINUED] amoxicillin-clavulanate (AUGMENTIN) 875-125 MG tablet Take 1 tablet by mouth every 12 (twelve) hours. 4 tablet 0 03/04/2017 at Unknown time  . [DISCONTINUED] azithromycin (ZITHROMAX) 250 MG tablet Take 2 tablets (500 mg total) by mouth daily. 6 each 0 03/04/2017 at Unknown time   Scheduled: . apixaban  5 mg Oral BID  . budesonide  0.5 mg Nebulization BID   . furosemide  20 mg Intravenous BID  . insulin aspart  0-20 Units Subcutaneous TID WC  . insulin aspart  0-5 Units Subcutaneous QHS  . insulin aspart  8 Units Subcutaneous TID WC  . insulin glargine  35 Units Subcutaneous Daily  . ipratropium  0.5 mg Nebulization QID  . levalbuterol  0.63 mg Nebulization QID  . mouth rinse  15 mL Mouth Rinse BID  . methylPREDNISolone (SOLU-MEDROL) injection  60 mg Intravenous Q12H  . metoprolol tartrate  25 mg Oral BID  . pantoprazole  40 mg Oral Daily  . pravastatin  80 mg Oral QHS   Continuous: . diltiazem (CARDIZEM) infusion 15 mg/hr (03/07/17 0730)  . piperacillin-tazobactam (ZOSYN)  IV Stopped (03/07/17 0723)  . vancomycin Stopped (03/07/17 0350)   CHE:NIDPOEUMPNTIR **OR** acetaminophen, ondansetron **OR** ondansetron (ZOFRAN) IV  Assesment: He has COPD exacerbation. He has acute on chronic hypoxic respiratory failure. He's had trouble with atrial fib with RVR. Active Problems:   Diabetes mellitus type 2, uncontrolled ([name])   COPD (chronic obstructive pulmonary disease) ([name])   Pulmonary fibrosis ([name])   Acute on chronic respiratory failure with hypoxia ([name])   Rt Sided Aspiration pneumonia ([name])   Atrial fibrillation with rapid ventricular response ([name])   Pneumonia   Chronic diastolic CHF (congestive heart failure) ([name])   HCAP (healthcare-associated pneumonia)   Acute on chronic respiratory failure ([name])   AF (paroxysmal atrial fibrillation) ([name])   Acute metabolic encephalopathy   Palliative care encounter   Goals of care, counseling/discussion   Encounter for hospice care discussion   Uncontrolled type 2 diabetes mellitus with hyperglycemia, with long-term current use of insulin ([name])   CKD (chronic kidney disease), stage III   Lobar pneumonia ([place])    Plan: Continue current treatments. He is on diltiazem. Palliative care is involved which I think is appropriate    LOS: 3 days   Edward [name] L 03/07/2017, 9:20 AM

## 2017-03-07 NOTE — Progress Notes (Signed)
PROGRESS NOTE  Edward [name] IRC:[id number] DOB: Mar 20, 1935 DOA: 03/04/2017 PCP: Orlena Sheldon, PA-C  Brief History:  82 year old male with a history of COPD, CHF, pulmonary fibrosis and chronic respiratory failure on 4 L of oxygen, was recently in the hospital for pneumonia and insisted on being discharged home on 6/2. On 6/3, he required readmission for shortness of breath and confusion. He was found by EMS at home without any oxygen on and with oxygen saturations in the 70s. He was readmitted to the hospital for treatment of pneumonia, acute on chronic respiratory failure requiring BiPAP and atrial fibrillation with RVR.  Assessment/Plan: Acute on chronic respiratory failure with hypoxia -Multifactorial including COPD exacerbation, acute on chronic diastolic CHF, aspiration pneumonia/HCAP, pulmonary fibrosis -Did not require BiPAP in the evening of 03/06/2017 -Wean oxygen back to baseline 4L -pulmonary hygiene  HCAP/Aspiration pneumonia -Continue vancomycin/Zosyn -Continue dysphagia 3 diet with thin liquids  Acute on chronic diastolic CHF -The patient remains clinically volume overloaded -Continue furosemide IV -NEG 2.5 L -Continue intravenous furosemide -Dry weight 160-163 -02/26/2001 echo EF 60-65%, grade 2 DD, trivial MR -add metoprolol tartrate  COPD exacerbation/pulmonary fibrosis -Continue intravenous Solu-Medrol -Continue antibiotics -Continue bronchodilators -Continue Pulmicort -personally reviewed CXR--Increased left perihilar infiltrate, new right lower lobe infiltrate  Paroxysmal fibrillation with RVR -Continue diltiazem drip -Secondary to COPD and infectious process  Diabetes mellitus type 2, uncontrolled -Increase Lantus to 35 units -Increase pre-meal NovoLog to 8 units -Continue NovoLog sliding scale  CKD stage III -Baseline creatinine 1.0-1.3 -Monitor with diuresis  Essential hypertension -d/c losartan -add metoprolol tartrate  Acute  metabolic encephalopathy -Secondary to hypoxia and infectious process -Improved -Back to baseline  Anemia of chronic disease -Baseline hemoglobin 9  Goals of care -Patient is DO NOT RESUSCITATE -Appreciate palliative medicine follow-up    Disposition Plan:   Not stable for d/c, remain in stepdown Family Communication:   No Family at bedside  Consultants:  pulmonary  Code Status:  DNR  DVT Prophylaxis:  apixaban   Procedures: As Listed in Progress Note Above  Antibiotics: vanco 6/3>>> Zosyn 6/3>>>    Subjective: Patient denies fevers, chills, headache, chest pain, dyspnea, nausea, vomiting, diarrhea, abdominal pain, dysuria, hematuria, hematochezia, and melena.   Objective: Vitals:   03/07/17 0300 03/07/17 0400 03/07/17 0600 03/07/17 0742  BP: (!) 145/68 (!) 168/85 (!) 156/82 116/86  Pulse: 65 97 (!) 152   Resp: 16 (!) 26 (!) 22 (!) 25  Temp:      TempSrc:      SpO2: 96% 97% 95%   Weight:      Height:        Intake/Output Summary (Last 24 hours) at 03/07/17 0816 Last data filed at 03/07/17 0323  Gross per 24 hour  Intake           442.83 ml  Output             2200 ml  Net         -1757.17 ml   Weight change:  Exam:   General:  Pt is alert, follows commands appropriately, not in acute distress  HEENT: No icterus, No thrush, No neck mass, [place]/AT  Cardiovascular: RRR, S1/S2, no rubs, no gallops  Respiratory: CTA bilaterally, no wheezing, no crackles, no rhonchi  Abdomen: Soft/+BS, non tender, non distended, no guarding  Extremities: No edema, No lymphangitis, No petechiae, No rashes, no synovitis   Data Reviewed: I have personally reviewed following labs and  imaging studies Basic Metabolic Panel:  Recent Labs Lab 03/01/17 0509 03/04/17 1139 03/05/17 0433 03/06/17 0410 03/07/17 0434  NA 136 134* 142 138 135  K 4.4 4.9 4.6 3.9 4.1  CL 101 101 109 104 100*  CO2 27 23 22 27 28   GLUCOSE 174* 434* 288* 214* 211*  BUN 36* 37* 40* 39* 41*    CREATININE 1.08 1.20 1.19 1.18 1.14  CALCIUM 8.6* 9.9 9.5 9.4 9.4   Liver Function Tests:  Recent Labs Lab 03/05/17 0433 03/06/17 0410  AST 17 21  ALT 17 21  ALKPHOS 73 60  BILITOT 0.6 0.5  PROT 5.5* 5.7*  ALBUMIN 2.0* 2.6*   No results for input(s): LIPASE, AMYLASE in the last 168 hours. No results for input(s): AMMONIA in the last 168 hours. Coagulation Profile: No results for input(s): INR, PROTIME in the last 168 hours. CBC:  Recent Labs Lab 03/04/17 1139 03/05/17 0433 03/06/17 0410 03/07/17 0434  WBC 28.5* 17.9* 15.9* 17.6*  NEUTROABS 25.4*  --   --   --   HGB 10.2* 9.6* 8.6* 9.5*  HCT 30.8* 29.3* 26.5* 29.3*  MCV 78.0 77.5* 78.9 78.6  PLT 605* 419* 542* 637*   Cardiac Enzymes:  Recent Labs Lab 03/04/17 1139  TROPONINI 0.04*   BNP: Invalid input(s): POCBNP CBG:  Recent Labs Lab 03/06/17 0708 03/06/17 1208 03/06/17 1622 03/06/17 2118 03/07/17 0747  GLUCAP 217* 341* 354* 271* 236*   HbA1C: No results for input(s): HGBA1C in the last 72 hours. Urine analysis:    Component Value Date/Time   COLORURINE YELLOW 02/25/2017 1000   APPEARANCEUR CLEAR 02/25/2017 1000   LABSPEC <1.005 (L) 02/25/2017 1000   PHURINE 5.5 02/25/2017 1000   GLUCOSEU >=500 (A) 02/25/2017 1000   HGBUR TRACE (A) 02/25/2017 1000   BILIRUBINUR NEGATIVE 02/25/2017 1000   KETONESUR NEGATIVE 02/25/2017 1000   PROTEINUR TRACE (A) 02/25/2017 1000   NITRITE NEGATIVE 02/25/2017 1000   LEUKOCYTESUR NEGATIVE 02/25/2017 1000   Sepsis Labs: @LABRCNTIP (procalcitonin:4,lacticidven:4) ) Recent Results (from the past 240 hour(s))  Blood Culture (routine x 2)     Status: None   Collection Time: 02/25/17  8:58 AM  Result Value Ref Range Status   Specimen Description BLOOD LEFT HAND  Final   Special Requests   Final    BOTTLES DRAWN AEROBIC AND ANAEROBIC Blood Culture adequate volume   Culture NO GROWTH 5 DAYS  Final   Report Status 03/02/2017 FINAL  Final  Blood Culture (routine x  2)     Status: None   Collection Time: 02/25/17  9:05 AM  Result Value Ref Range Status   Specimen Description LEFT ANTECUBITAL  Final   Special Requests   Final    BOTTLES DRAWN AEROBIC AND ANAEROBIC Blood Culture adequate volume   Culture NO GROWTH 5 DAYS  Final   Report Status 03/02/2017 FINAL  Final  MRSA PCR Screening     Status: Abnormal   Collection Time: 02/25/17  8:22 PM  Result Value Ref Range Status   MRSA by PCR POSITIVE (A) NEGATIVE Final    Comment:        The GeneXpert MRSA Assay (FDA approved for NASAL specimens only), is one component of a comprehensive MRSA colonization surveillance program. It is not intended to diagnose MRSA infection nor to guide or monitor treatment for MRSA infections. RESULT CALLED TO, READ BACK BY AND VERIFIED WITH: KOGER,L @ 024 BY MATTHEWS,B 02/26/17   Culture, blood (routine x 2)     Status:  None (Preliminary result)   Collection Time: 03/04/17 11:52 AM  Result Value Ref Range Status   Specimen Description BLOOD LEFT ARM  Final   Special Requests   Final    Blood Culture results may not be optimal due to an inadequate volume of blood received in culture bottles   Culture NO GROWTH 3 DAYS  Final   Report Status PENDING  Incomplete  Culture, blood (routine x 2)     Status: None (Preliminary result)   Collection Time: 03/04/17  5:24 PM  Result Value Ref Range Status   Specimen Description BLOOD LEFT HAND  Final   Special Requests   Final    Blood Culture results may not be optimal due to an inadequate volume of blood received in culture bottles   Culture NO GROWTH 3 DAYS  Final   Report Status PENDING  Incomplete     Scheduled Meds: . apixaban  5 mg Oral BID  . budesonide  0.5 mg Nebulization BID  . furosemide  20 mg Intravenous BID  . insulin aspart  0-20 Units Subcutaneous TID WC  . insulin aspart  0-5 Units Subcutaneous QHS  . insulin aspart  8 Units Subcutaneous TID WC  . insulin glargine  35 Units Subcutaneous Daily  .  ipratropium  0.5 mg Nebulization QID  . levalbuterol  0.63 mg Nebulization QID  . losartan  50 mg Oral Daily  . methylPREDNISolone (SOLU-MEDROL) injection  60 mg Intravenous Q12H  . pantoprazole  40 mg Oral Daily  . pravastatin  80 mg Oral QHS   Continuous Infusions: . diltiazem (CARDIZEM) infusion 15 mg/hr (03/07/17 0730)  . piperacillin-tazobactam (ZOSYN)  IV Stopped (03/07/17 0723)  . vancomycin 750 mg (03/07/17 0250)    Procedures/Studies: Dg Chest 2 View  Result Date: 02/27/2017 CLINICAL DATA:  Followup pneumonia.  Shortness of breath. EXAM: CHEST  2 VIEW COMPARISON:  02/25/2017 FINDINGS: Normal heart size. Small pleural effusions are identified. Bilateral multifocal airspace consolidation is identified involving the right upper lobe, right lower lobe and left lower lobe. There maybe central cavitation within the left lower lobe opacification. There is mild diffuse edema noted. Prominence of the hilar structures are identified bilaterally. Cannot rule out adenopathy. IMPRESSION: 1. Bilateral multifocal airspace consolidation is again noted which in the acute setting is compatible with pneumonia. There may be new cavitation associated with left lower lobe airspace consolidation. Consider further evaluation with contrast enhanced CT of the chest. 2. Cannot rule out bilateral hilar adenopathy. Attention on CT is advised. Electronically Signed   By: Kerby Moors M.D.   On: 02/27/2017 10:48   Dg Abdomen 1 View  Result Date: 02/25/2017 CLINICAL DATA:  Abdominal pain. EXAM: ABDOMEN - 1 VIEW COMPARISON:  March 16, 2016 FINDINGS: Moderate fecal loading seen throughout the colon. No bowel obstruction is identified on today's study. No free air, portal venous gas, or pneumatosis. IMPRESSION: Moderate fecal loading in the colon.  No other abnormalities. Electronically Signed   By: Dorise Bullion III M.D   On: 02/25/2017 09:40   Ct Chest W Contrast  Result Date: 02/28/2017 CLINICAL DATA:  Admitted  with pneumonia, shortness of breath and cough. EXAM: CT CHEST WITH CONTRAST TECHNIQUE: Multidetector CT imaging of the chest was performed during intravenous contrast administration. CONTRAST:  81mL ISOVUE-300 IOPAMIDOL (ISOVUE-300) INJECTION 61% COMPARISON:  Chest radiograph - 02/27/2017 ; 02/25/2017; 07/24/2016 FINDINGS: Cardiovascular: Borderline cardiomegaly. Coronary artery calcifications. No pericardial effusion. Mediastinum/Nodes: Mediastinal and hilar lymphadenopathy with index pretracheal lymph node measuring 1.2  cm in greatest short axis diameter (59, series 20), index high right paratracheal lymph node measuring 0.9 cm (image 34, series 2), index right infrahilar lymph node measuring 1.1 cm (image 87) and index left infrahilar lymph node measuring 1.2 cm (image 86). No axillary lymphadenopathy. Lungs/Pleura: Extensive bilateral masslike airspace opacities with index opacity with the right lower lobe measuring approximately 8.0 x 4.0 cm (image 97, series 4), index opacity within the right upper lobe measuring approximately 6.0 x 5.2 cm and index opacity within the superior segment of the left lower lobe measuring approximately 6.2 x 3.2 cm (image 87, series 4). All these masslike opacities are associated with rather extensive adjacent ground-glass. Small right and trace left-sided pleural effusions. There is narrowing of the bilateral lower lobe bronchi however the bronchial airways appear patent. Advanced apical predominant mixed centrilobular and paraseptal emphysematous change. No pneumothorax. Upper Abdomen: Evaluation of the upper abdomen is degraded secondary to patient respiratory artifact. Potential ill-defined stranding about the imaged cranial aspect of the gallbladder, could be artifactual due to patient motion. Atherosclerotic plaque with the abdominal aorta. The pancreas is largely fatty replaced. Musculoskeletal: No acute or aggressive osseous abnormalities. DDD within the image caudal aspect  of the cervical spine. Degenerative change of the bilateral sternoclavicular joints. Regional soft tissues appear normal. Normal appearance of the thyroid gland. IMPRESSION: 1. Extensive bilateral masslike consolidative opacities with associated mediastinal and hilar lymphadenopathy, likely progressed compared to chest radiograph performed 02/25/2017 and worrisome for progression of extensive multifocal infection. Further evaluation with short-term follow-up contrast-enhanced chest CT (approximately 4-6 weeks after treatment) is recommended to ensure resolution and exclude the presence of a discrete underlying pulmonary mass. 2. Aortic Atherosclerosis (ICD10-I70.0) and Emphysema (ICD10-J43.9). Electronically Signed   By: Sandi Mariscal M.D.   On: 02/28/2017 10:16   Dg Chest Port 1 View  Result Date: 03/04/2017 CLINICAL DATA:  Pulmonary fibrosis and pneumonia. EXAM: PORTABLE CHEST 1 VIEW COMPARISON:  CT scan 02/28/2017.  Chest x-ray 02/27/2017. FINDINGS: Focal airspace consolidation identified right mid lung. Right infrahilar airspace disease is new in the interval. There is persistent retrocardiac left base collapse/ consolidation. Underlying chronic interstitial changes suspected. Cardiopericardial silhouette is at upper limits of normal for size. The visualized bony structures of the thorax are intact. Telemetry leads overlie the chest. IMPRESSION: Multifocal airspace disease with some progression in the infrahilar right lower lobe, compatible with pneumonia. Electronically Signed   By: Misty Stanley M.D.   On: 03/04/2017 11:44   Dg Chest Port 1 View  Result Date: 02/25/2017 CLINICAL DATA:  Shortness of breath. EXAM: PORTABLE CHEST 1 VIEW COMPARISON:  July 24, 2016 FINDINGS: Infiltrates are seen in the right mid and medial right lower lung probably involving both the upper and lower lobes. No infiltrate seen on the left. The cardiomediastinal silhouette is normal. No pneumothorax. No other acute  abnormalities. IMPRESSION: Multifocal right sided infiltrates suggesting pneumonia given history. Recommend treatment and follow-up to resolution. Electronically Signed   By: Dorise Bullion III M.D   On: 02/25/2017 09:36    Marguis Mathieson, DO  [facility] Pager [phone]  If 7PM-7AM, please contact night-coverage [email or web address] Password [email or web address] 03/07/2017, 8:16 AM   LOS: 3 days

## 2017-03-08 DIAGNOSIS — J690 Pneumonitis due to inhalation of food and vomit: Secondary | ICD-10-CM

## 2017-03-08 LAB — GLUCOSE, CAPILLARY
GLUCOSE-CAPILLARY: 139 mg/dL — AB (ref 65–99)
GLUCOSE-CAPILLARY: 176 mg/dL — AB (ref 65–99)
GLUCOSE-CAPILLARY: 280 mg/dL — AB (ref 65–99)
Glucose-Capillary: 106 mg/dL — ABNORMAL HIGH (ref 65–99)

## 2017-03-08 MED ORDER — PREDNISONE 20 MG PO TABS
60.0000 mg | ORAL_TABLET | Freq: Every day | ORAL | Status: DC
  Administered 2017-03-09 – 2017-03-10 (×2): 60 mg via ORAL
  Filled 2017-03-08 (×2): qty 3

## 2017-03-08 MED ORDER — DILTIAZEM HCL ER COATED BEADS 120 MG PO CP24
120.0000 mg | ORAL_CAPSULE | Freq: Once | ORAL | Status: AC
  Administered 2017-03-08: 120 mg via ORAL
  Filled 2017-03-08: qty 1

## 2017-03-08 MED ORDER — DILTIAZEM HCL ER COATED BEADS 240 MG PO CP24
240.0000 mg | ORAL_CAPSULE | Freq: Every day | ORAL | Status: DC
  Administered 2017-03-09 – 2017-03-10 (×2): 240 mg via ORAL
  Filled 2017-03-08 (×2): qty 1

## 2017-03-08 NOTE — Progress Notes (Signed)
Subjective: He says he feels better. His heart rate is definitely better. He does not seem as short of breath. He doesn't have any other new complaints except that he can't sleep  Objective: Vital signs in last 24 hours: Temp:  [97.4 F (36.3 C)-98.9 F (37.2 C)] 98.9 F (37.2 C) (06/07 0800) Pulse Rate:  [51-99] 80 (06/07 0700) Resp:  [14-31] 23 (06/07 0700) BP: (115-169)/(47-95) 149/79 (06/07 0700) SpO2:  [78 %-100 %] 100 % (06/07 0855) Weight:  [79.9 kg (176 lb 2.4 oz)] 79.9 kg (176 lb 2.4 oz) (06/07 0500) Weight change:  Last BM Date: 03/07/17  Intake/Output from previous day: 06/06 0701 - 06/07 0700 In: 1180 [P.O.:730; IV Piggyback:450] Out: 3302 [Urine:3300; Stool:2]  PHYSICAL EXAM General appearance: alert, cooperative and mild distress Resp: rhonchi bilaterally Cardio: irregularly irregular rhythm GI: soft, non-tender; bowel sounds normal; no masses,  no organomegaly Extremities: extremities normal, atraumatic, no cyanosis or edema Skin warm and dry  Lab Results:  Results for orders placed or performed during the hospital encounter of 03/04/17 (from the past 48 hour(s))  Vitamin B12     Status: Abnormal   Collection Time: 03/06/17  9:49 AM  Result Value Ref Range   Vitamin B-12 947 (H) 180 - 914 pg/mL    Comment: (NOTE) This assay is not validated for testing neonatal or myeloproliferative syndrome specimens for Vitamin B12 levels. Performed at [facility], [place] [address] [place], [place] [address]   Folate     Status: None   Collection Time: 03/06/17  9:49 AM  Result Value Ref Range   Folate 11.5 >5.9 ng/mL    Comment: Performed at [facility] [address] [place], [place] [address]  Iron and TIBC     Status: Abnormal   Collection Time: 03/06/17  9:49 AM  Result Value Ref Range   Iron 46 45 - 182 ug/dL   TIBC 209 (L) 250 - 450 ug/dL   Saturation Ratios 22 17.9 - 39.5 %   UIBC 163 ug/dL    Comment: Performed at [facility],  [place] [address] [place], [place] [address]  Ferritin     Status: None   Collection Time: 03/06/17  9:49 AM  Result Value Ref Range   Ferritin 122 24 - 336 ng/mL    Comment: Performed at [facility] [address] [place], [place] [address]  Reticulocytes     Status: Abnormal   Collection Time: 03/06/17  9:49 AM  Result Value Ref Range   Retic Ct Pct 2.2 0.4 - 3.1 %   RBC. 3.43 (L) 4.22 - 5.81 MIL/uL   Retic Count, Manual 75.5 19.0 - 186.0 K/uL  Glucose, capillary     Status: Abnormal   Collection Time: 03/06/17 12:08 PM  Result Value Ref Range   Glucose-Capillary 341 (H) 65 - 99 mg/dL  Glucose, capillary     Status: Abnormal   Collection Time: 03/06/17  4:22 PM  Result Value Ref Range   Glucose-Capillary 354 (H) 65 - 99 mg/dL  Glucose, capillary     Status: Abnormal   Collection Time: 03/06/17  9:18 PM  Result Value Ref Range   Glucose-Capillary 271 (H) 65 - 99 mg/dL  Basic metabolic panel     Status: Abnormal   Collection Time: 03/07/17  4:34 AM  Result Value Ref Range   Sodium 135 135 - 145 mmol/L   Potassium 4.1 3.5 - 5.1 mmol/L   Chloride 100 (L)  101 - 111 mmol/L   CO2 28 22 - 32 mmol/L   Glucose, Bld 211 (H) 65 - 99 mg/dL   BUN 41 (H) 6 - 20 mg/dL   Creatinine, Ser 1.14 0.61 - 1.24 mg/dL   Calcium 9.4 8.9 - 10.3 mg/dL   GFR calc non Af Amer 59 (L) >60 mL/min   GFR calc Af Amer >60 >60 mL/min    Comment: (NOTE) The eGFR has been calculated using the CKD EPI equation. This calculation has not been validated in all clinical situations. eGFR's persistently <60 mL/min signify possible Chronic Kidney Disease.    Anion gap 7 5 - 15  CBC     Status: Abnormal   Collection Time: 03/07/17  4:34 AM  Result Value Ref Range   WBC 17.6 (H) 4.0 - 10.5 K/uL   RBC 3.73 (L) 4.22 - 5.81 MIL/uL   Hemoglobin 9.5 (L) 13.0 - 17.0 g/dL   HCT 29.3 (L) 39.0 - 52.0 %   MCV 78.6 78.0 - 100.0 fL   MCH 25.5 (L) 26.0 - 34.0 pg   MCHC 32.4 30.0 - 36.0 g/dL   RDW 17.6 (H) 11.5 - 15.5 %    Platelets 637 (H) 150 - 400 K/uL  Glucose, capillary     Status: Abnormal   Collection Time: 03/07/17  7:47 AM  Result Value Ref Range   Glucose-Capillary 236 (H) 65 - 99 mg/dL  Glucose, capillary     Status: Abnormal   Collection Time: 03/07/17 11:56 AM  Result Value Ref Range   Glucose-Capillary 324 (H) 65 - 99 mg/dL  Vancomycin, trough     Status: None   Collection Time: 03/07/17  1:13 PM  Result Value Ref Range   Vancomycin Tr 18 15 - 20 ug/mL  Glucose, capillary     Status: Abnormal   Collection Time: 03/07/17  4:41 PM  Result Value Ref Range   Glucose-Capillary 360 (H) 65 - 99 mg/dL  Glucose, capillary     Status: Abnormal   Collection Time: 03/07/17  8:53 PM  Result Value Ref Range   Glucose-Capillary 223 (H) 65 - 99 mg/dL  Glucose, capillary     Status: Abnormal   Collection Time: 03/08/17  8:16 AM  Result Value Ref Range   Glucose-Capillary 106 (H) 65 - 99 mg/dL    ABGS No results for input(s): PHART, PO2ART, TCO2, HCO3 in the last 72 hours.  Invalid input(s): PCO2 CULTURES Recent Results (from the past 240 hour(s))  Culture, blood (routine x 2)     Status: None (Preliminary result)   Collection Time: 03/04/17 11:52 AM  Result Value Ref Range Status   Specimen Description BLOOD LEFT ARM  Final   Special Requests   Final    Blood Culture results may not be optimal due to an inadequate volume of blood received in culture bottles   Culture NO GROWTH 4 DAYS  Final   Report Status PENDING  Incomplete  Culture, blood (routine x 2)     Status: None (Preliminary result)   Collection Time: 03/04/17  5:24 PM  Result Value Ref Range Status   Specimen Description BLOOD LEFT HAND  Final   Special Requests   Final    Blood Culture results may not be optimal due to an inadequate volume of blood received in culture bottles   Culture NO GROWTH 4 DAYS  Final   Report Status PENDING  Incomplete   Studies/Results: No results found.  Medications:  Prior to Admission:  Prescriptions Prior to Admission  Medication Sig Dispense Refill Last Dose  . albuterol (PROVENTIL) (2.5 MG/3ML) 0.083% nebulizer solution INHALE 1 VIAL VIA NEBULIZER EVERY 6 HOURS AS NEEDED FOR WHEEZING OR SHORTNESS OF BREATH 360 mL 5 unknown at unknown  . apixaban (ELIQUIS) 5 MG TABS tablet Take 1 tablet (5 mg total) by mouth 2 (two) times daily. 60 tablet 0 03/04/2017 at Unknown time  . budesonide (PULMICORT) 0.5 MG/2ML nebulizer solution Take 2 mLs (0.5 mg total) by nebulization 2 (two) times daily. 120 mL 5 unknown at unknown  . diltiazem (CARDIZEM CD) 240 MG 24 hr capsule Take 1 capsule (240 mg total) by mouth daily. 30 capsule 0 unknown at unknown  . furosemide (LASIX) 20 MG tablet Take 2 each morning. 60 tablet 3 Past Week at Unknown time  . insulin aspart protamine- aspart (NOVOLOG MIX 70/30) (70-30) 100 UNIT/ML injection Inject 0.18 mLs (18 Units total) into the skin daily with breakfast. 10 mL 11 unknown at unknown  . insulin NPH Human (NOVOLIN N) 100 UNIT/ML injection 18 units with breakfast daily and 13 units with supper daily 10 mL 0 03/04/2017 at Unknown time  . ipratropium (ATROVENT) 0.02 % nebulizer solution Take 2.5 mLs (0.5 mg total) by nebulization 4 (four) times daily. 25 mL 12 unknown at unknown  . losartan (COZAAR) 100 MG tablet Take 0.5 tablets (50 mg total) by mouth daily. 30 tablet 5 Past Week at Unknown time  . metFORMIN (GLUCOPHAGE) 1000 MG tablet Take 1 tablet (1,000 mg total) by mouth 2 (two) times daily. 60 tablet 5 Past Week at Unknown time  . omeprazole (PRILOSEC) 20 MG capsule Take 1 capsule (20 mg total) by mouth daily. 30 capsule 3 Past Week at Unknown time  . pravastatin (PRAVACHOL) 80 MG tablet Take 1 tablet (80 mg total) by mouth at bedtime. 30 tablet 5 Past Week at Unknown time  . [DISCONTINUED] amoxicillin-clavulanate (AUGMENTIN) 875-125 MG tablet Take 1 tablet by mouth every 12 (twelve) hours. 4 tablet 0 03/04/2017 at Unknown time  . [DISCONTINUED] azithromycin  (ZITHROMAX) 250 MG tablet Take 2 tablets (500 mg total) by mouth daily. 6 each 0 03/04/2017 at Unknown time   Scheduled: . apixaban  5 mg Oral BID  . budesonide  0.5 mg Nebulization BID  . diltiazem  120 mg Oral Daily  . furosemide  20 mg Intravenous BID  . insulin aspart  0-20 Units Subcutaneous TID WC  . insulin aspart  0-5 Units Subcutaneous QHS  . insulin aspart  8 Units Subcutaneous TID WC  . insulin glargine  35 Units Subcutaneous Daily  . ipratropium  0.5 mg Nebulization TID  . levalbuterol  0.63 mg Nebulization TID  . mouth rinse  15 mL Mouth Rinse BID  . methylPREDNISolone (SOLU-MEDROL) injection  60 mg Intravenous Q12H  . metoprolol tartrate  25 mg Oral BID  . pantoprazole  40 mg Oral Daily  . pravastatin  80 mg Oral QHS   Continuous: . piperacillin-tazobactam (ZOSYN)  IV Stopped (03/08/17 0804)  . vancomycin Stopped (03/08/17 0209)   IOX:[id number] **OR** acetaminophen, ondansetron **OR** ondansetron (ZOFRAN) IV  Assesment: He was admitted with altered mental status acute on chronic hypoxic respiratory failure atrial fib with rapid ventricular response uncontrolled diabetes chronic kidney disease pneumonia. He has improved some. At baseline he is poorly functional. Palliative care is involved in his treatment now and trying to help him with goal setting. Active Problems:   Diabetes mellitus type 2, uncontrolled ([name])   COPD (chronic  obstructive pulmonary disease) ([name])   Pulmonary fibrosis ([name])   Acute on chronic respiratory failure with hypoxia ([name])   Rt Sided Aspiration pneumonia ([name])   Atrial fibrillation with rapid ventricular response ([name])   Pneumonia   Chronic diastolic CHF (congestive heart failure) ([name])   HCAP (healthcare-associated pneumonia)   Acute on chronic respiratory failure ([name])   AF (paroxysmal atrial fibrillation) ([name])   Acute metabolic encephalopathy   Palliative care encounter   Goals of care, counseling/discussion   Encounter for  hospice care discussion   Uncontrolled type 2 diabetes mellitus with hyperglycemia, with long-term current use of insulin ([name])   CKD (chronic kidney disease), stage III   Lobar pneumonia ([place])    Plan: Continue current treatments    LOS: 4 days   Ashanty Coltrane L 03/08/2017, 9:08 AM

## 2017-03-08 NOTE — Progress Notes (Signed)
Daily Progress Note   Patient Name: Edward [name]       Date: 03/08/2017 DOB: January 23, 1935  Age: 82 y.o. MRN#: [id number] Attending Physician: Orson Eva, MD Primary Care Physician: [name] Admit Date: 03/04/2017  Reason for Consultation/Follow-up: Establishing goals of care and Psychosocial/spiritual support  Subjective: Edward [name] is sitting up straight in his [place] chair, soundly asleep.  He does not open his eyes when I touch his knee or call his name several times. Nursing staff shares that he did not sleep last night except for brief 20 minute increments. Family is expected today around 12 noon, I will meet with them then.  I returned later in the day and speak with Edward [name]. He is awake, greets me making and keeping eye contact as I enter. We talk about his desire to return home. I share that since comfort measures only is not his goal at this time, it is important for him to stay in the hospital until he has improved as much as possible. He agrees. We talk about services at home to help him including RN services, aid services (to help with bathing when/if needed), social worker (to help with paperwork and understand bills) and chaplain as needed. I share that these services are all free services in his home. He states that he feels this would help him. I share that this service is called "hospice". He asks if he would have to go to the hospice center, and I reassure him that they come to his home. He is still in agreement to take hospice services [facility]) upon discharge. Mr. Standley asks about the billing here at the hospital, that he feels he will be able to afford this stay. I share that the hospice social worker can help him with billing, and he may be able to speak  with our financial counselor here at [facility]. No questions at this time, but I reassure him that I will return tomorrow to answer any further questions.  We talk about symptom management. [name] is having difficulty sleeping at night. He agrees to a trial of sleep aid medication, I share I will discuss this with hospitalist, Dr. Carles Collet.  Wife Edward [name], son Edward [name], and Edward [name] sister arrive at the hospital. I share with him that Edward [name] has accepted hospice  when he leaves. I share that he will likely the in the hospital another day or 2. No questions or concerns at this time.  Length of Stay: 4  Current Medications: Scheduled Meds:  . apixaban  5 mg Oral BID  . budesonide  0.5 mg Nebulization BID  . diltiazem  120 mg Oral Daily  . furosemide  20 mg Intravenous BID  . insulin aspart  0-20 Units Subcutaneous TID WC  . insulin aspart  0-5 Units Subcutaneous QHS  . insulin aspart  8 Units Subcutaneous TID WC  . insulin glargine  35 Units Subcutaneous Daily  . ipratropium  0.5 mg Nebulization TID  . levalbuterol  0.63 mg Nebulization TID  . mouth rinse  15 mL Mouth Rinse BID  . methylPREDNISolone (SOLU-MEDROL) injection  60 mg Intravenous Q12H  . metoprolol tartrate  25 mg Oral BID  . pantoprazole  40 mg Oral Daily  . pravastatin  80 mg Oral QHS    Continuous Infusions: . piperacillin-tazobactam (ZOSYN)  IV Stopped (03/08/17 0804)  . vancomycin Stopped (03/08/17 0209)    PRN Meds: acetaminophen **OR** acetaminophen, ondansetron **OR** ondansetron (ZOFRAN) IV  Physical Exam  Constitutional: No distress.  Sleeping soundly, no distress  HENT:  Head: Atraumatic.  Cardiovascular: Normal rate and regular rhythm.   Rate 60s  Pulmonary/Chest: Effort normal. No respiratory distress.  Abdominal: Soft. He exhibits no distension.  Neurological:  Sleeping soundly, does not wake to touch or voice  Skin: Skin is warm and dry.  Nursing note and vitals reviewed.           Vital Signs: BP  (!) 160/77   Pulse 90   Temp 98.9 F (37.2 C) (Oral)   Resp 20   Ht 5\' 6"  (1.676 m)   Wt 79.9 kg (176 lb 2.4 oz)   SpO2 100%   BMI 28.43 kg/m  SpO2: SpO2: 100 % O2 Device: O2 Device:  (nebulizer) O2 Flow Rate: O2 Flow Rate (L/min): 8 L/min  Intake/output summary:  Intake/Output Summary (Last 24 hours) at 03/08/17 1041 Last data filed at 03/08/17 1000  Gross per 24 hour  Intake              940 ml  Output             2301 ml  Net            -1361 ml   LBM: Last BM Date: 03/07/17 Baseline Weight: Weight: 81.2 kg (179 lb) Most recent weight: Weight: 79.9 kg (176 lb 2.4 oz)       Palliative Assessment/Data:    Flowsheet Rows     Most Recent Value  Intake Tab  Referral Department  Hospitalist  Unit at Time of Referral  ICU  Palliative Care Primary Diagnosis  Pulmonary  Date Notified  03/06/17  Palliative Care Type  New Palliative care  Reason for referral  Clarify Goals of Care  Date of Admission  03/04/17  Date first seen by Palliative Care  03/06/17  # of days Palliative referral response time  0 Day(s)  # of days IP prior to Palliative referral  2  Clinical Assessment  Palliative Performance Scale Score  30%  Pain Max last 24 hours  Not able to report  Pain Min Last 24 hours  Not able to report  Dyspnea Max Last 24 Hours  Not able to report  Dyspnea Min Last 24 hours  Not able to report  Psychosocial & Spiritual Assessment  Palliative Care  Outcomes  Patient/Family meeting held?  Yes  Who was at the meeting?  Patient only initially  Palliative Care Outcomes  Provided psychosocial or spiritual support, Provided advance care planning  Patient/Family wishes: Interventions discontinued/not started   Mechanical Ventilation      Patient Active Problem List   Diagnosis Date Noted  . Uncontrolled type 2 diabetes mellitus with hyperglycemia, with long-term current use of insulin ([place]) 03/07/2017  . CKD (chronic kidney disease), stage III 03/07/2017  . Lobar  pneumonia ([place]) 03/07/2017  . Palliative care encounter   . Goals of care, counseling/discussion   . Encounter for hospice care discussion   . AF (paroxysmal atrial fibrillation) ([place]) 03/05/2017  . Acute metabolic encephalopathy 93/81/0175  . Pneumonia 03/04/2017  . Chronic diastolic CHF (congestive heart failure) ([place]) 03/04/2017  . HCAP (healthcare-associated pneumonia) 03/04/2017  . Acute on chronic respiratory failure ([place]) 03/04/2017  . Atrial fibrillation with rapid ventricular response ([place])   . Chronic diastolic heart failure ([place])   . Elevated troponin   . Sepsis ([place]) 02/25/2017  . Emesis, persistent 02/25/2017  . Constipation 02/25/2017  . Overflow diarrhea/Constipation 02/25/2017  . Rt Sided Aspiration pneumonia ([place]) 02/25/2017  . Diabetes mellitus type 2, uncontrolled, without complications ([place]) 07/26/8526  . Non compliance w medication regimen 12/02/2015  . Preseptal cellulitis 05/09/2015  . Other and unspecified hyperlipidemia 05/12/2014  . Elevated LFTs 05/12/2014  . Foot laceration 05/12/2014  . Dyspnea 12/19/2013  . Acute on chronic respiratory failure with hypoxia ([place]) 11/17/2013  . Diastolic dysfunction 78/24/2353  . COPD exacerbation ([place]) 11/15/2013  . Atrial fibrillation ([place]) 11/10/2013  . Elevated PSA 01/20/2013  . Diabetes mellitus type 2, uncontrolled ([place])   . COPD (chronic obstructive pulmonary disease) ([place])   . Hypertension   . Allergy   . Elevated lipids   . Pulmonary fibrosis ([place])   . Bronchitis   . Colon polyps   . Colon polyps     Palliative Care Assessment & Plan   Patient Profile: 82 y.o.malewith past medical history of COPD 4 L home oxygen dependent, pulmonary fibrosis, increased cholesterol and hypertension, diabetes insulin-dependentadmitted on 6/3/2018with acute on chronic respiratory failure with hypoxia secondary to pneumonia with COPD and pulmonary fibrosis.  Assessment: Acute on chronic respiratory failure; COPD  exacerbation, acute on chronic heart failure, likely aspiration pneumonia and pulmonary fibrosis. Oxygen at 4 L via nasal cannula at baseline. Treating with vancomycin and Zosyn. Family is accepting of hospice services in home to manage respiratory status.  Recommendations/Plan:  Continue to treat the treatable with the goal of some recovery, then returned home with the benefits of hospice of [facility].  Goals of Care and Additional Recommendations:  Limitations on Scope of Treatment: Continue to treat the treatable but no CPR or intubation, okay with BiPAP  Code Status:    Code Status Orders        Start     Ordered   03/04/17 1642  Do not attempt resuscitation (DNR)  Continuous    Question Answer Comment  In the event of cardiac or respiratory ARREST Do not call a "code blue"   In the event of cardiac or respiratory ARREST Do not perform Intubation, CPR, defibrillation or ACLS   In the event of cardiac or respiratory ARREST Use medication by any route, position, wound care, and other measures to relive pain and suffering. May use oxygen, suction and manual treatment of airway obstruction as needed for comfort.      03/04/17 1654  Code Status History    Date Active Date Inactive Code Status Order ID Comments User Context   03/04/2017 12:52 PM 03/04/2017  4:54 PM DNR [id number]  Nat Christen, MD ED   02/25/2017 12:49 PM 03/03/2017  7:51 PM Full Code [id number]  Roxan Hockey, MD Inpatient   07/24/2016 10:39 AM 07/26/2016  6:05 PM Full Code [id number]  Kathie Dike, MD Inpatient   05/09/2015  8:51 PM 05/10/2015  5:05 PM Full Code [id number]  Phillips Grout, MD Inpatient   11/16/2013 12:20 AM 11/17/2013  5:05 PM Full Code [id number]  Oswald Hillock, MD Inpatient       Prognosis:   < 6 months would not be surprising based on frailty, albumin of 2.016/4/18, COPD oxygen dependent on 4 L, pulmonary fibrosis.   Discharge Planning:  The goal is to extend life/quality of life, return  home with the benefits of hospice of [facility].     Care plan was discussed with nursing staff, case manager, and Dr. Carles Collet.  Thank you for allowing the Palliative Medicine Team to assist in the care of this patient.   Time In: [id number] Time Out: 0905 1215 Total Time 40 minutes total  Prolonged Time Billed  no       Greater than 50%  of this time was spent counseling and coordinating care related to the above assessment and plan.  Drue Novel, NP  Please contact Palliative Medicine Team phone at [phone] for questions and concerns.

## 2017-03-08 NOTE — Progress Notes (Signed)
PROGRESS NOTE  Edward [name] JSH:[id number] DOB: [birth year] DOA: 03/04/2017 PCP: Orlena Sheldon, PA-C  Brief History:  82 year old male with a history of COPD, CHF, pulmonary fibrosis and chronic respiratory failure on 4 L of oxygen, wasrecently in the hospital for pneumonia and insistedon being discharged home on 6/2. On 6/3, he required readmission for shortness of breath and confusion. He was found by EMS at home without any oxygen on and with oxygen saturations in the 70s. He was readmitted to the hospital for treatment of pneumonia, acute on chronic respiratory failure requiring BiPAP and atrial fibrillation with RVR.  Assessment/Plan: Acute on chronic respiratory failure with hypoxia -Multifactorial including COPD exacerbation, acute on chronic diastolic CHF, aspiration pneumonia/HCAP, pulmonary fibrosis -Did not require BiPAP in the evening of 03/06/2017 or 03/07/17 -Wean oxygen back to baseline 3 L -pulmonary hygiene  HCAP/Aspiration pneumonia -Continue Zosyn -d/c vancomycin -Continue dysphagia 3 diet with thin liquids  Acute on chronic diastolic CHF -The patient remains clinically volume overloaded -Continue furosemide IV -NEG 5 L -Continue intravenous furosemide -Dry weight 160-163 -02/26/2001 echo EF 60-65%, grade 2 DD, trivial MR -added metoprolol tartrate  COPD exacerbation/pulmonary fibrosis -Continue intravenous Solu-Medrol-->de-escalate to prednisone on 03/09/17 -Continue antibiotics -Continue bronchodilators -Continue Pulmicort -personally reviewed CXR--Increased left perihilar infiltrate, new right lower lobe infiltrate  Paroxysmal fibrillation with RVR -Continue diltiazem drip-->weaned off -increase diltiazem CD 240 mg daily -Secondary to COPD and infectious process  Diabetes mellitus type 2, uncontrolled -Continue Lantus to 35 units -Continue pre-meal NovoLog to 8 units -Continue NovoLog sliding scale  CKD stage III -Baseline creatinine  1.0-1.3 -Monitor with diuresis  Essential hypertension -d/c losartan -add metoprolol tartrate  Acute metabolic encephalopathy -Secondary to hypoxia and infectious process -Improved -Back to baseline  Anemia of chronic disease -Baseline hemoglobin 9  Goals of care -Patient is DO NOT RESUSCITATE -Appreciate palliative medicine follow-up    Disposition Plan:   Not stable for d/c, remain in stepdown Family Communication:   No Family at bedside  Consultants:  pulmonary  Code Status:  DNR  DVT Prophylaxis:  apixaban   Procedures: As Listed in Progress Note Above  Antibiotics: vanco 6/3>>>03/08/17 Zosyn 6/3>>>    Subjective: Patient denies fevers, chills, headache, chest pain, dyspnea, nausea, vomiting, diarrhea, abdominal pain, dysuria, hematuria, hematochezia, and melena.  Pt states that his breathing is improving but remains dyspneic with exertion.  C/o nonproductive cough   Objective: Vitals:   03/08/17 0900 03/08/17 1200 03/08/17 1300 03/08/17 1418  BP: (!) 160/77  (!) 178/82   Pulse: 90     Resp: 20  (!) 22   Temp:  97.5 F (36.4 C)    TempSrc:  Oral    SpO2: 100%   96%  Weight:      Height:        Intake/Output Summary (Last 24 hours) at 03/08/17 1425 Last data filed at 03/08/17 1351  Gross per 24 hour  Intake              950 ml  Output             2575 ml  Net            -1625 ml   Weight change:  Exam:   General:  Pt is alert, follows commands appropriately, not in acute distress  HEENT: No icterus, No thrush, No neck mass, [place]/AT  Cardiovascular: RRR, S1/S2, no rubs, no gallops  Respiratory: bibasilar rales, no wheeze  Abdomen: Soft/+BS, non tender, non distended, no guarding  Extremities: 1 + LE edema, No lymphangitis, No petechiae, No rashes, no synovitis   Data Reviewed: I have personally reviewed following labs and imaging studies Basic Metabolic Panel:  Recent Labs Lab 03/04/17 1139 03/05/17 0433  03/06/17 0410 03/07/17 0434  NA 134* 142 138 135  K 4.9 4.6 3.9 4.1  CL 101 109 104 100*  CO2 23 22 27 28   GLUCOSE 434* 288* 214* 211*  BUN 37* 40* 39* 41*  CREATININE 1.20 1.19 1.18 1.14  CALCIUM 9.9 9.5 9.4 9.4   Liver Function Tests:  Recent Labs Lab 03/05/17 0433 03/06/17 0410  AST 17 21  ALT 17 21  ALKPHOS 73 60  BILITOT 0.6 0.5  PROT 5.5* 5.7*  ALBUMIN 2.0* 2.6*   No results for input(s): LIPASE, AMYLASE in the last 168 hours. No results for input(s): AMMONIA in the last 168 hours. Coagulation Profile: No results for input(s): INR, PROTIME in the last 168 hours. CBC:  Recent Labs Lab 03/04/17 1139 03/05/17 0433 03/06/17 0410 03/07/17 0434  WBC 28.5* 17.9* 15.9* 17.6*  NEUTROABS 25.4*  --   --   --   HGB 10.2* 9.6* 8.6* 9.5*  HCT 30.8* 29.3* 26.5* 29.3*  MCV 78.0 77.5* 78.9 78.6  PLT 605* 419* 542* 637*   Cardiac Enzymes:  Recent Labs Lab 03/04/17 1139  TROPONINI 0.04*   BNP: Invalid input(s): POCBNP CBG:  Recent Labs Lab 03/07/17 1156 03/07/17 1641 03/07/17 2053 03/08/17 0816 03/08/17 1141  GLUCAP 324* 360* 223* 106* 139*   HbA1C: No results for input(s): HGBA1C in the last 72 hours. Urine analysis:    Component Value Date/Time   COLORURINE YELLOW 02/25/2017 1000   APPEARANCEUR CLEAR 02/25/2017 1000   LABSPEC <1.005 (L) 02/25/2017 1000   PHURINE 5.5 02/25/2017 1000   GLUCOSEU >=500 (A) 02/25/2017 1000   HGBUR TRACE (A) 02/25/2017 1000   BILIRUBINUR NEGATIVE 02/25/2017 1000   KETONESUR NEGATIVE 02/25/2017 1000   PROTEINUR TRACE (A) 02/25/2017 1000   NITRITE NEGATIVE 02/25/2017 1000   LEUKOCYTESUR NEGATIVE 02/25/2017 1000   Sepsis Labs: @LABRCNTIP (procalcitonin:4,lacticidven:4) ) Recent Results (from the past 240 hour(s))  Culture, blood (routine x 2)     Status: None (Preliminary result)   Collection Time: 03/04/17 11:52 AM  Result Value Ref Range Status   Specimen Description BLOOD LEFT ARM  Final   Special Requests    Final    Blood Culture results may not be optimal due to an inadequate volume of blood received in culture bottles   Culture NO GROWTH 4 DAYS  Final   Report Status PENDING  Incomplete  Culture, blood (routine x 2)     Status: None (Preliminary result)   Collection Time: 03/04/17  5:24 PM  Result Value Ref Range Status   Specimen Description BLOOD LEFT HAND  Final   Special Requests   Final    Blood Culture results may not be optimal due to an inadequate volume of blood received in culture bottles   Culture NO GROWTH 4 DAYS  Final   Report Status PENDING  Incomplete     Scheduled Meds: . apixaban  5 mg Oral BID  . budesonide  0.5 mg Nebulization BID  . diltiazem  120 mg Oral Daily  . furosemide  20 mg Intravenous BID  . insulin aspart  0-20 Units Subcutaneous TID WC  . insulin aspart  0-5 Units Subcutaneous QHS  . insulin aspart  8 Units Subcutaneous TID WC  .  insulin glargine  35 Units Subcutaneous Daily  . ipratropium  0.5 mg Nebulization TID  . levalbuterol  0.63 mg Nebulization TID  . mouth rinse  15 mL Mouth Rinse BID  . methylPREDNISolone (SOLU-MEDROL) injection  60 mg Intravenous Q12H  . metoprolol tartrate  25 mg Oral BID  . pantoprazole  40 mg Oral Daily  . pravastatin  80 mg Oral QHS   Continuous Infusions: . piperacillin-tazobactam (ZOSYN)  IV 3.375 g (03/08/17 1213)  . vancomycin 750 mg (03/08/17 1351)    Procedures/Studies: Dg Chest 2 View  Result Date: 02/27/2017 CLINICAL DATA:  Followup pneumonia.  Shortness of breath. EXAM: CHEST  2 VIEW COMPARISON:  02/25/2017 FINDINGS: Normal heart size. Small pleural effusions are identified. Bilateral multifocal airspace consolidation is identified involving the right upper lobe, right lower lobe and left lower lobe. There maybe central cavitation within the left lower lobe opacification. There is mild diffuse edema noted. Prominence of the hilar structures are identified bilaterally. Cannot rule out adenopathy. IMPRESSION:  1. Bilateral multifocal airspace consolidation is again noted which in the acute setting is compatible with pneumonia. There may be new cavitation associated with left lower lobe airspace consolidation. Consider further evaluation with contrast enhanced CT of the chest. 2. Cannot rule out bilateral hilar adenopathy. Attention on CT is advised. Electronically Signed   By: Kerby Moors M.D.   On: 02/27/2017 10:48   Dg Abdomen 1 View  Result Date: 02/25/2017 CLINICAL DATA:  Abdominal pain. EXAM: ABDOMEN - 1 VIEW COMPARISON:  March 16, 2016 FINDINGS: Moderate fecal loading seen throughout the colon. No bowel obstruction is identified on today's study. No free air, portal venous gas, or pneumatosis. IMPRESSION: Moderate fecal loading in the colon.  No other abnormalities. Electronically Signed   By: Dorise Bullion III M.D   On: 02/25/2017 09:40   Ct Chest W Contrast  Result Date: 02/28/2017 CLINICAL DATA:  Admitted with pneumonia, shortness of breath and cough. EXAM: CT CHEST WITH CONTRAST TECHNIQUE: Multidetector CT imaging of the chest was performed during intravenous contrast administration. CONTRAST:  70mL ISOVUE-300 IOPAMIDOL (ISOVUE-300) INJECTION 61% COMPARISON:  Chest radiograph - 02/27/2017 ; 02/25/2017; 07/24/2016 FINDINGS: Cardiovascular: Borderline cardiomegaly. Coronary artery calcifications. No pericardial effusion. Mediastinum/Nodes: Mediastinal and hilar lymphadenopathy with index pretracheal lymph node measuring 1.2 cm in greatest short axis diameter (59, series 20), index high right paratracheal lymph node measuring 0.9 cm (image 34, series 2), index right infrahilar lymph node measuring 1.1 cm (image 87) and index left infrahilar lymph node measuring 1.2 cm (image 86). No axillary lymphadenopathy. Lungs/Pleura: Extensive bilateral masslike airspace opacities with index opacity with the right lower lobe measuring approximately 8.0 x 4.0 cm (image 97, series 4), index opacity within the right  upper lobe measuring approximately 6.0 x 5.2 cm and index opacity within the superior segment of the left lower lobe measuring approximately 6.2 x 3.2 cm (image 87, series 4). All these masslike opacities are associated with rather extensive adjacent ground-glass. Small right and trace left-sided pleural effusions. There is narrowing of the bilateral lower lobe bronchi however the bronchial airways appear patent. Advanced apical predominant mixed centrilobular and paraseptal emphysematous change. No pneumothorax. Upper Abdomen: Evaluation of the upper abdomen is degraded secondary to patient respiratory artifact. Potential ill-defined stranding about the imaged cranial aspect of the gallbladder, could be artifactual due to patient motion. Atherosclerotic plaque with the abdominal aorta. The pancreas is largely fatty replaced. Musculoskeletal: No acute or aggressive osseous abnormalities. DDD within the image caudal aspect of  the cervical spine. Degenerative change of the bilateral sternoclavicular joints. Regional soft tissues appear normal. Normal appearance of the thyroid gland. IMPRESSION: 1. Extensive bilateral masslike consolidative opacities with associated mediastinal and hilar lymphadenopathy, likely progressed compared to chest radiograph performed 02/25/2017 and worrisome for progression of extensive multifocal infection. Further evaluation with short-term follow-up contrast-enhanced chest CT (approximately 4-6 weeks after treatment) is recommended to ensure resolution and exclude the presence of a discrete underlying pulmonary mass. 2. Aortic Atherosclerosis (ICD10-I70.0) and Emphysema (ICD10-J43.9). Electronically Signed   By: Sandi Mariscal M.D.   On: 02/28/2017 10:16   Dg Chest Port 1 View  Result Date: 03/04/2017 CLINICAL DATA:  Pulmonary fibrosis and pneumonia. EXAM: PORTABLE CHEST 1 VIEW COMPARISON:  CT scan 02/28/2017.  Chest x-ray 02/27/2017. FINDINGS: Focal airspace consolidation identified right  mid lung. Right infrahilar airspace disease is new in the interval. There is persistent retrocardiac left base collapse/ consolidation. Underlying chronic interstitial changes suspected. Cardiopericardial silhouette is at upper limits of normal for size. The visualized bony structures of the thorax are intact. Telemetry leads overlie the chest. IMPRESSION: Multifocal airspace disease with some progression in the infrahilar right lower lobe, compatible with pneumonia. Electronically Signed   By: Misty Stanley M.D.   On: 03/04/2017 11:44   Dg Chest Port 1 View  Result Date: 02/25/2017 CLINICAL DATA:  Shortness of breath. EXAM: PORTABLE CHEST 1 VIEW COMPARISON:  July 24, 2016 FINDINGS: Infiltrates are seen in the right mid and medial right lower lung probably involving both the upper and lower lobes. No infiltrate seen on the left. The cardiomediastinal silhouette is normal. No pneumothorax. No other acute abnormalities. IMPRESSION: Multifocal right sided infiltrates suggesting pneumonia given history. Recommend treatment and follow-up to resolution. Electronically Signed   By: Dorise Bullion III M.D   On: 02/25/2017 09:36    Demaryius Imran, DO  [facility] Pager [phone]  If 7PM-7AM, please contact night-coverage [email or web address] Password [email or web address] 03/08/2017, 2:25 PM   LOS: 4 days

## 2017-03-09 LAB — BASIC METABOLIC PANEL
ANION GAP: 6 (ref 5–15)
BUN: 38 mg/dL — ABNORMAL HIGH (ref 6–20)
CALCIUM: 9.3 mg/dL (ref 8.9–10.3)
CO2: 32 mmol/L (ref 22–32)
Chloride: 98 mmol/L — ABNORMAL LOW (ref 101–111)
Creatinine, Ser: 1.1 mg/dL (ref 0.61–1.24)
GFR calc Af Amer: >60 mL/min (ref >60)
GFR calc non Af Amer: >60 mL/min (ref >60)
GLUCOSE: 84 mg/dL (ref 65–99)
Potassium: 3.8 mmol/L (ref 3.5–5.1)
SODIUM: 136 mmol/L (ref 135–145)

## 2017-03-09 LAB — CULTURE, BLOOD (ROUTINE X 2)
Culture: NO GROWTH
Culture: NO GROWTH

## 2017-03-09 LAB — MAGNESIUM: Magnesium: 2.1 mg/dL (ref 1.7–2.4)

## 2017-03-09 LAB — GLUCOSE, CAPILLARY
GLUCOSE-CAPILLARY: 146 mg/dL — AB (ref 65–99)
Glucose-Capillary: 88 mg/dL (ref 65–99)
Glucose-Capillary: 156 mg/dL — ABNORMAL HIGH (ref 65–99)
Glucose-Capillary: 185 mg/dL — ABNORMAL HIGH (ref 65–99)

## 2017-03-09 MED ORDER — INSULIN ASPART 100 UNIT/ML ~~LOC~~ SOLN
4.0000 [IU] | Freq: Three times a day (TID) | SUBCUTANEOUS | Status: DC
  Administered 2017-03-09: 4 [IU] via SUBCUTANEOUS

## 2017-03-09 MED ORDER — FUROSEMIDE 10 MG/ML IJ SOLN
60.0000 mg | Freq: Once | INTRAMUSCULAR | Status: AC
  Administered 2017-03-09: 60 mg via INTRAVENOUS
  Filled 2017-03-09: qty 6

## 2017-03-09 MED ORDER — FUROSEMIDE 40 MG PO TABS
40.0000 mg | ORAL_TABLET | Freq: Every day | ORAL | Status: DC
  Administered 2017-03-10: 40 mg via ORAL
  Filled 2017-03-09: qty 1

## 2017-03-09 NOTE — Progress Notes (Signed)
Pt arrived to room. Vitals WNL. Telemetry placed.

## 2017-03-09 NOTE — Progress Notes (Signed)
Pharmacy Antibiotic Note  Edward [name] is a 82 y.o. male admitted on 03/04/2017 with pneumonia.  Pharmacy has been consulted for zosyn dosing.  Plan: Continue Zosyn 3.375g IV q8h (4 hour infusion). F/U cxs and clinical progress and LOT Monitor V/S, labs, and levels as indicated  Height: 5\' 6"  (167.6 cm) Weight: 176 lb 9.4 oz (80.1 kg) IBW/kg (Calculated) : 63.8  Temp (24hrs), Avg:97.9 F (36.6 C), Min:97.5 F (36.4 C), Max:98.6 F (37 C)   Recent Labs Lab 03/04/17 1139 03/05/17 0433 03/06/17 0410 03/07/17 0434 03/07/17 1313 03/09/17 0430  WBC 28.5* 17.9* 15.9* 17.6*  --   --   CREATININE 1.20 1.19 1.18 1.14  --  1.10  VANCOTROUGH  --   --   --   --  18  --     Estimated Creatinine Clearance: 54.1 mL/min (by C-G formula based on SCr of 1.1 mg/dL).    Allergies  Allergen Reactions  . Ace Inhibitors Other (See Comments)    Hyperkalemia--07/23/2013:patient states not familiar with the following allergy   Antimicrobials this admission: Vancomycin 6/3 >> 6/8 Zosyn 6/3 >>   Microbiology results: 6/3 BCx: ng 02/25/17: MRS PCR is positive  Thank you for allowing pharmacy to be a part of this patient's care.  Hart Robinsons, PharmD Clinical Pharmacist Pager:  [phone] 03/09/2017   03/09/2017 10:58 AM

## 2017-03-09 NOTE — Progress Notes (Signed)
Report called to [place] on 300, answered questions at this time.  Will transport pt and belongings once pt finishes dinner.

## 2017-03-09 NOTE — Care Management (Addendum)
SS # [id number]  PCP  Dena Billet B   Demographics  Comment    Last edited by  on at   Address: Home Phone: Work Phone: Mobile Phone:  [address]  [place] [address]   [phone] -- [phone]  SSN: Insurance: Marital Status: Religion:  [id number] [place] Married (none)  Patient Ethnicity & Race   Ethnic Group Patient Race  Not Hispanic or Latino White or Caucasian  Documents Filed to Patient   Power of Attorney Living Will Clinical Unknown Study Attachment Consent Form ABN Waiver After Visit Summary Lab Result Scan Code Status MyChart Status Advance Care Planning  Not on File Not on File Not on File Not on File Filed Not on [name] DNR [Updated on 03/04/17 1654] Pending Jump to the Activity  Admission Information   Attending Provider Admitting Provider Admission Type Admission Date/Time  Tat, Shanon Brow, MD Kathie Dike, MD Emergency 03/04/17 1109  Discharge Date Hospital Service Auth/Cert Status Service Area   Internal Medicine Incomplete [place]  Unit Room/Bed Admission Status   AP-ICCUP NURSING IC10/IC10-01 Admission (Confirmed)   Hospital Account   Name Acct ID Class Status Primary Coverage  Edward [name], Edward [name] [id number] Inpatient [place]      Guarantor Account (for Hospital Account [id number])   Name Relation to [place]? Acct Type  Edward [name] Self [facility]SA Yes Personal/Family  Address Phone    [address] [place], [place] [address] [phone])        Coverage Information (for Hospital Account [id number])   F/O Payor/Plan Precert #  [facility] #  Edward [name], Edward [name] [id number]  Address [place] [address] [address]

## 2017-03-09 NOTE — Progress Notes (Signed)
PROGRESS NOTE  Edward [name] YQM:[id number] DOB: 11-28-1934 DOA: 03/04/2017 PCP: Orlena Sheldon, PA-C  Brief History:  82 year old male with a history of COPD, CHF, pulmonary fibrosis and chronic respiratory failure on 3 L of oxygen, wasrecently in the hospital for pneumonia and insistedon being discharged home on 6/2. On 6/3, he required readmission for shortness of breath and confusion. He was found by EMS at home without any oxygen on and with oxygen saturations in the 70s. He was readmitted to the hospital for treatment of pneumonia, acute on chronic respiratory failure requiring BiPAP and atrial fibrillation with RVR.  Assessment/Plan: Acute on chronic respiratory failure with hypoxia -Multifactorial including COPD exacerbation, acute on chronic diastolic CHF, aspiration pneumonia/HCAP, pulmonary fibrosis -Did not require BiPAP in the evening of 03/06/2017 or 03/07/17 -Wean oxygen back to baseline 3 L -pulmonary hygiene  HCAP/Aspiration pneumonia -Continue Zosyn>>>amox/clav -d/c vancomycin -Continue dysphagia 3 diet with thin liquids  Acute on chronic diastolic CHF -The patient remains clinically volume overloaded -Continue furosemide IV -NEG 5 L -Continue intravenous furosemide--give additional dose today IV -Dry weight 160-163 -02/26/2001 echo EF 60-65%, grade 2 DD, trivial MR -added metoprolol tartrate  COPD exacerbation/pulmonary fibrosis -Continue intravenous Solu-Medrol-->de-escalate to prednisone on 03/09/17 -Continue antibiotics -Continue bronchodilators -Continue Pulmicort -personally reviewed CXR--Increased left perihilar infiltrate, new right lower lobe infiltrate  Paroxysmal fibrillation with RVR -Continue diltiazem drip-->weaned off -increase diltiazem CD 240 mg daily -Secondary to COPD and infectious process  Diabetes mellitus type 2, uncontrolled -Continue Lantus to 35 units -Continue pre-meal NovoLog to 4 units -Continue NovoLog sliding  scale  CKD stage III -Baseline creatinine 1.0-1.3 -Monitor with diuresis  Essential hypertension -d/closartan -continue metoprolol tartrate  Acute metabolic encephalopathy -Secondary to hypoxia and infectious process -Improved -Back to baseline  Anemia of chronic disease -Baseline hemoglobin 9  Goals of care -Patient is DO NOT RESUSCITATE -Appreciate palliative medicine follow-up    Disposition Plan: home on 6/9 if stable Family Communication: No Family at bedside  Consultants: pulmonary  Code Status: DNR  DVT Prophylaxis: apixaban   Procedures: As Listed in Progress Note Above  Antibiotics: vanco 6/3>>>03/08/17 Zosyn 6/3>>>       Subjective: Patient denies fevers, chills, headache, chest pain, dyspnea, nausea, vomiting, diarrhea, abdominal pain, dysuria, hematuria, hematochezia, and melena.   Objective: Vitals:   03/09/17 1500 03/09/17 1526 03/09/17 1600 03/09/17 1700  BP: (!) 172/103 138/68 (!) 150/76 (!) 165/83  Pulse: 92 75 92 (!) 102  Resp: 20 19 (!) 21 20  Temp:   98.4 F (36.9 C)   TempSrc:   Oral   SpO2: 95% 96% 95% 92%  Weight:      Height:        Intake/Output Summary (Last 24 hours) at 03/09/17 1712 Last data filed at 03/09/17 1704  Gross per 24 hour  Intake              440 ml  Output             1850 ml  Net            -1410 ml   Weight change: 0.2 kg (7.1 oz) Exam:   General:  Pt is alert, follows commands appropriately, not in acute distress  HEENT: No icterus, No thrush, No neck mass, [place]/AT  Cardiovascular: IRRR, S1/S2, no rubs, no gallops  Respiratory: Diminished breath sounds at the bases. Bibasilar crackles, right greater than left. No wheezing.   Abdomen: Soft/+BS, non  tender, non distended, no guarding  Extremities: 1 + LE edema, No lymphangitis, No petechiae, No rashes, no synovitis   Data Reviewed: I have personally reviewed following labs and imaging studies Basic Metabolic  Panel:  Recent Labs Lab 03/04/17 1139 03/05/17 0433 03/06/17 0410 03/07/17 0434 03/09/17 0430  NA 134* 142 138 135 136  K 4.9 4.6 3.9 4.1 3.8  CL 101 109 104 100* 98*  CO2 23 22 27 28  32  GLUCOSE 434* 288* 214* 211* 84  BUN 37* 40* 39* 41* 38*  CREATININE 1.20 1.19 1.18 1.14 1.10  CALCIUM 9.9 9.5 9.4 9.4 9.3  MG  --   --   --   --  2.1   Liver Function Tests:  Recent Labs Lab 03/05/17 0433 03/06/17 0410  AST 17 21  ALT 17 21  ALKPHOS 73 60  BILITOT 0.6 0.5  PROT 5.5* 5.7*  ALBUMIN 2.0* 2.6*   No results for input(s): LIPASE, AMYLASE in the last 168 hours. No results for input(s): AMMONIA in the last 168 hours. Coagulation Profile: No results for input(s): INR, PROTIME in the last 168 hours. CBC:  Recent Labs Lab 03/04/17 1139 03/05/17 0433 03/06/17 0410 03/07/17 0434  WBC 28.5* 17.9* 15.9* 17.6*  NEUTROABS 25.4*  --   --   --   HGB 10.2* 9.6* 8.6* 9.5*  HCT 30.8* 29.3* 26.5* 29.3*  MCV 78.0 77.5* 78.9 78.6  PLT 605* 419* 542* 637*   Cardiac Enzymes:  Recent Labs Lab 03/04/17 1139  TROPONINI 0.04*   BNP: Invalid input(s): POCBNP CBG:  Recent Labs Lab 03/08/17 1641 03/08/17 2112 03/09/17 0748 03/09/17 1117 03/09/17 1605  GLUCAP 176* 280* 88 146* 156*   HbA1C: No results for input(s): HGBA1C in the last 72 hours. Urine analysis:    Component Value Date/Time   COLORURINE YELLOW 02/25/2017 1000   APPEARANCEUR CLEAR 02/25/2017 1000   LABSPEC <1.005 (L) 02/25/2017 1000   PHURINE 5.5 02/25/2017 1000   GLUCOSEU >=500 (A) 02/25/2017 1000   HGBUR TRACE (A) 02/25/2017 1000   BILIRUBINUR NEGATIVE 02/25/2017 1000   KETONESUR NEGATIVE 02/25/2017 1000   PROTEINUR TRACE (A) 02/25/2017 1000   NITRITE NEGATIVE 02/25/2017 1000   LEUKOCYTESUR NEGATIVE 02/25/2017 1000   Sepsis Labs: @LABRCNTIP (procalcitonin:4,lacticidven:4) ) Recent Results (from the past 240 hour(s))  Culture, blood (routine x 2)     Status: None   Collection Time: 03/04/17  11:52 AM  Result Value Ref Range Status   Specimen Description BLOOD LEFT ARM  Final   Special Requests   Final    Blood Culture results may not be optimal due to an inadequate volume of blood received in culture bottles   Culture NO GROWTH 5 DAYS  Final   Report Status 03/09/2017 FINAL  Final  Culture, blood (routine x 2)     Status: None   Collection Time: 03/04/17  5:24 PM  Result Value Ref Range Status   Specimen Description BLOOD LEFT HAND  Final   Special Requests   Final    Blood Culture results may not be optimal due to an inadequate volume of blood received in culture bottles   Culture NO GROWTH 5 DAYS  Final   Report Status 03/09/2017 FINAL  Final     Scheduled Meds: . apixaban  5 mg Oral BID  . budesonide  0.5 mg Nebulization BID  . diltiazem  240 mg Oral Daily  . [START ON 03/10/2017] furosemide  40 mg Oral Daily  . insulin aspart  0-20  Units Subcutaneous TID WC  . insulin aspart  0-5 Units Subcutaneous QHS  . insulin aspart  4 Units Subcutaneous TID WC  . insulin glargine  35 Units Subcutaneous Daily  . ipratropium  0.5 mg Nebulization TID  . levalbuterol  0.63 mg Nebulization TID  . mouth rinse  15 mL Mouth Rinse BID  . metoprolol tartrate  25 mg Oral BID  . pantoprazole  40 mg Oral Daily  . pravastatin  80 mg Oral QHS  . predniSONE  60 mg Oral Q breakfast   Continuous Infusions: . piperacillin-tazobactam (ZOSYN)  IV Stopped (03/09/17 1557)    Procedures/Studies: Dg Chest 2 View  Result Date: 02/27/2017 CLINICAL DATA:  Followup pneumonia.  Shortness of breath. EXAM: CHEST  2 VIEW COMPARISON:  02/25/2017 FINDINGS: Normal heart size. Small pleural effusions are identified. Bilateral multifocal airspace consolidation is identified involving the right upper lobe, right lower lobe and left lower lobe. There maybe central cavitation within the left lower lobe opacification. There is mild diffuse edema noted. Prominence of the hilar structures are identified bilaterally.  Cannot rule out adenopathy. IMPRESSION: 1. Bilateral multifocal airspace consolidation is again noted which in the acute setting is compatible with pneumonia. There may be new cavitation associated with left lower lobe airspace consolidation. Consider further evaluation with contrast enhanced CT of the chest. 2. Cannot rule out bilateral hilar adenopathy. Attention on CT is advised. Electronically Signed   By: Kerby Moors M.D.   On: 02/27/2017 10:48   Dg Abdomen 1 View  Result Date: 02/25/2017 CLINICAL DATA:  Abdominal pain. EXAM: ABDOMEN - 1 VIEW COMPARISON:  March 16, 2016 FINDINGS: Moderate fecal loading seen throughout the colon. No bowel obstruction is identified on today's study. No free air, portal venous gas, or pneumatosis. IMPRESSION: Moderate fecal loading in the colon.  No other abnormalities. Electronically Signed   By: Dorise Bullion III M.D   On: 02/25/2017 09:40   Ct Chest W Contrast  Result Date: 02/28/2017 CLINICAL DATA:  Admitted with pneumonia, shortness of breath and cough. EXAM: CT CHEST WITH CONTRAST TECHNIQUE: Multidetector CT imaging of the chest was performed during intravenous contrast administration. CONTRAST:  38mL ISOVUE-300 IOPAMIDOL (ISOVUE-300) INJECTION 61% COMPARISON:  Chest radiograph - 02/27/2017 ; 02/25/2017; 07/24/2016 FINDINGS: Cardiovascular: Borderline cardiomegaly. Coronary artery calcifications. No pericardial effusion. Mediastinum/Nodes: Mediastinal and hilar lymphadenopathy with index pretracheal lymph node measuring 1.2 cm in greatest short axis diameter (59, series 20), index high right paratracheal lymph node measuring 0.9 cm (image 34, series 2), index right infrahilar lymph node measuring 1.1 cm (image 87) and index left infrahilar lymph node measuring 1.2 cm (image 86). No axillary lymphadenopathy. Lungs/Pleura: Extensive bilateral masslike airspace opacities with index opacity with the right lower lobe measuring approximately 8.0 x 4.0 cm (image 97,  series 4), index opacity within the right upper lobe measuring approximately 6.0 x 5.2 cm and index opacity within the superior segment of the left lower lobe measuring approximately 6.2 x 3.2 cm (image 87, series 4). All these masslike opacities are associated with rather extensive adjacent ground-glass. Small right and trace left-sided pleural effusions. There is narrowing of the bilateral lower lobe bronchi however the bronchial airways appear patent. Advanced apical predominant mixed centrilobular and paraseptal emphysematous change. No pneumothorax. Upper Abdomen: Evaluation of the upper abdomen is degraded secondary to patient respiratory artifact. Potential ill-defined stranding about the imaged cranial aspect of the gallbladder, could be artifactual due to patient motion. Atherosclerotic plaque with the abdominal aorta. The pancreas is largely  fatty replaced. Musculoskeletal: No acute or aggressive osseous abnormalities. DDD within the image caudal aspect of the cervical spine. Degenerative change of the bilateral sternoclavicular joints. Regional soft tissues appear normal. Normal appearance of the thyroid gland. IMPRESSION: 1. Extensive bilateral masslike consolidative opacities with associated mediastinal and hilar lymphadenopathy, likely progressed compared to chest radiograph performed 02/25/2017 and worrisome for progression of extensive multifocal infection. Further evaluation with short-term follow-up contrast-enhanced chest CT (approximately 4-6 weeks after treatment) is recommended to ensure resolution and exclude the presence of a discrete underlying pulmonary mass. 2. Aortic Atherosclerosis (ICD10-I70.0) and Emphysema (ICD10-J43.9). Electronically Signed   By: Sandi Mariscal M.D.   On: 02/28/2017 10:16   Dg Chest Port 1 View  Result Date: 03/04/2017 CLINICAL DATA:  Pulmonary fibrosis and pneumonia. EXAM: PORTABLE CHEST 1 VIEW COMPARISON:  CT scan 02/28/2017.  Chest x-ray 02/27/2017. FINDINGS:  Focal airspace consolidation identified right mid lung. Right infrahilar airspace disease is new in the interval. There is persistent retrocardiac left base collapse/ consolidation. Underlying chronic interstitial changes suspected. Cardiopericardial silhouette is at upper limits of normal for size. The visualized bony structures of the thorax are intact. Telemetry leads overlie the chest. IMPRESSION: Multifocal airspace disease with some progression in the infrahilar right lower lobe, compatible with pneumonia. Electronically Signed   By: Misty Stanley M.D.   On: 03/04/2017 11:44   Dg Chest Port 1 View  Result Date: 02/25/2017 CLINICAL DATA:  Shortness of breath. EXAM: PORTABLE CHEST 1 VIEW COMPARISON:  July 24, 2016 FINDINGS: Infiltrates are seen in the right mid and medial right lower lung probably involving both the upper and lower lobes. No infiltrate seen on the left. The cardiomediastinal silhouette is normal. No pneumothorax. No other acute abnormalities. IMPRESSION: Multifocal right sided infiltrates suggesting pneumonia given history. Recommend treatment and follow-up to resolution. Electronically Signed   By: Dorise Bullion III M.D   On: 02/25/2017 09:36    Soundra Lampley, DO  [facility] Pager [phone]  If 7PM-7AM, please contact night-coverage [email or web address] Password [email or web address] 03/09/2017, 5:12 PM   LOS: 5 days

## 2017-03-09 NOTE — Progress Notes (Signed)
Daily Progress Note   Patient Name: Edward [name]       Date: 03/09/2017 DOB: [birth year]  Age: 82 y.o. MRN#: [id number] Attending Physician: Orson Eva, MD Primary Care Physician: [name] Admit Date: 03/04/2017  Reason for Consultation/Follow-up: Establishing goals of care, Hospice Evaluation and Psychosocial/spiritual support  Subjective: Edward [name] is sitting up in his [place] chair in his room. He greets me, making and keeping eye contact as I enter. He looks much improved today. Present at bedside is son Edward [name].   I share that he may discharge today, or tomorrow. We talk about in-home hospice services, what to expect. Also encourage family to work with Education officer, museum with hospice.  Edward [name] is concerned about bills.   We talk about symptom management, and the benefit of morphine for breathlessness and anxiety relieving medications. Edward [name] states that he is willing to try these medications, but asks about cost. I share that medications, related to admitting diagnosis, are covered under hospice. Other medications will continue to bill 3 Medicare. I encourage family to call with any questions or concerns.  Length of Stay: 5  Current Medications: Scheduled Meds:  . apixaban  5 mg Oral BID  . budesonide  0.5 mg Nebulization BID  . diltiazem  240 mg Oral Daily  . furosemide  20 mg Intravenous BID  . insulin aspart  0-20 Units Subcutaneous TID WC  . insulin aspart  0-5 Units Subcutaneous QHS  . insulin aspart  8 Units Subcutaneous TID WC  . insulin glargine  35 Units Subcutaneous Daily  . ipratropium  0.5 mg Nebulization TID  . levalbuterol  0.63 mg Nebulization TID  . mouth rinse  15 mL Mouth Rinse BID  . metoprolol tartrate  25 mg Oral BID  . pantoprazole  40 mg Oral  Daily  . pravastatin  80 mg Oral QHS  . predniSONE  60 mg Oral Q breakfast    Continuous Infusions: . piperacillin-tazobactam (ZOSYN)  IV 3.375 g (03/09/17 1157)    PRN Meds: acetaminophen **OR** acetaminophen, ondansetron **OR** ondansetron (ZOFRAN) IV  Physical Exam  Constitutional: No distress.  HENT:  Head: Atraumatic.  Cardiovascular: Normal rate and regular rhythm.   Pulmonary/Chest: Effort normal. No respiratory distress.  Abdominal: Soft. He exhibits no distension.  Musculoskeletal: He exhibits no edema.  Neurological: He is alert.  Skin: Skin is warm and dry.  Nursing note and vitals reviewed.           Vital Signs: BP (!) 163/77   Pulse 86   Temp 97.9 F (36.6 C) (Oral)   Resp (!) 22   Ht 5\' 6"  (1.676 m)   Wt 80.1 kg (176 lb 9.4 oz)   SpO2 (!) 88%   BMI 28.50 kg/m  SpO2: SpO2: (!) 88 % O2 Device: O2 Device: Aerosol Mask O2 Flow Rate: O2 Flow Rate (L/min): 10 L/min (treatment)  Intake/output summary:  Intake/Output Summary (Last 24 hours) at 03/09/17 1241 Last data filed at 03/09/17 1118  Gross per 24 hour  Intake              640 ml  Output              875 ml  Net             -235 ml   LBM: Last BM Date: 03/08/17 Baseline Weight: Weight: 81.2 kg (179 lb) Most recent weight: Weight: 80.1 kg (176 lb 9.4 oz)       Palliative Assessment/Data:    Flowsheet Rows     Most Recent Value  Intake Tab  Referral Department  Hospitalist  Unit at Time of Referral  ICU  Palliative Care Primary Diagnosis  Pulmonary  Date Notified  03/06/17  Palliative Care Type  New Palliative care  Reason for referral  Clarify Goals of Care  Date of Admission  03/04/17  Date first seen by Palliative Care  03/06/17  # of days Palliative referral response time  0 Day(s)  # of days IP prior to Palliative referral  2  Clinical Assessment  Palliative Performance Scale Score  30%  Pain Max last 24 hours  Not able to report  Pain Min Last 24 hours  Not able to report    Dyspnea Max Last 24 Hours  Not able to report  Dyspnea Min Last 24 hours  Not able to report  Psychosocial & Spiritual Assessment  Palliative Care Outcomes  Patient/Family meeting held?  Yes  Who was at the meeting?  Patient only initially  Palliative Care Outcomes  Provided psychosocial or spiritual support, Provided advance care planning  Patient/Family wishes: Interventions discontinued/not started   Mechanical Ventilation      Patient Active Problem List   Diagnosis Date Noted  . Uncontrolled type 2 diabetes mellitus with hyperglycemia, with long-term current use of insulin ([place]) 03/07/2017  . CKD (chronic kidney disease), stage III 03/07/2017  . Lobar pneumonia ([place]) 03/07/2017  . Palliative care encounter   . Goals of care, counseling/discussion   . Encounter for hospice care discussion   . AF (paroxysmal atrial fibrillation) ([place]) 03/05/2017  . Acute metabolic encephalopathy 03/50/0938  . Pneumonia 03/04/2017  . Chronic diastolic CHF (congestive heart failure) ([place]) 03/04/2017  . HCAP (healthcare-associated pneumonia) 03/04/2017  . Acute on chronic respiratory failure ([place]) 03/04/2017  . Atrial fibrillation with rapid ventricular response ([place])   . Chronic diastolic heart failure ([place])   . Elevated troponin   . Sepsis ([place]) 02/25/2017  . Emesis, persistent 02/25/2017  . Constipation 02/25/2017  . Overflow diarrhea/Constipation 02/25/2017  . Rt Sided Aspiration pneumonia ([place]) 02/25/2017  . Diabetes mellitus type 2, uncontrolled, without complications ([place]) 18/29/9371  . Non compliance w medication regimen 12/02/2015  . Preseptal cellulitis 05/09/2015  . Other and unspecified hyperlipidemia 05/12/2014  . Elevated LFTs 05/12/2014  . Foot  laceration 05/12/2014  . Dyspnea 12/19/2013  . Acute on chronic respiratory failure with hypoxia ([place]) 11/17/2013  . Diastolic dysfunction 02/72/5366  . COPD exacerbation ([place]) 11/15/2013  . Atrial fibrillation ([place]) 11/10/2013  .  Elevated PSA 01/20/2013  . Diabetes mellitus type 2, uncontrolled ([place])   . COPD (chronic obstructive pulmonary disease) ([place])   . Hypertension   . Allergy   . Elevated lipids   . Pulmonary fibrosis ([place])   . Bronchitis   . Colon polyps   . Colon polyps     Palliative Care Assessment & Plan   Patient Profile: 82 y.o.malewith past medical history of COPD 4 L home oxygen dependent, pulmonary fibrosis, increased cholesterol and hypertension, diabetes insulin-dependentadmitted on 6/3/2018with acute on chronic respiratory failure with hypoxia secondary to pneumonia with COPD and pulmonary fibrosis.  Assessment: Acute on chronic respiratory failure; COPD exacerbation, acute on chronic heart failure, likely aspiration pneumonia and pulmonary fibrosis. Oxygen at 4 L via nasal cannula at baseline. Treating with vancomycin and Zosyn. Family is accepting of hospice services in home to manage respiratory status.  Recommendations/Plan:  Continue to treat the treatable with the goal of some recovery, then returned home with the benefits of hospice of [facility].  Goals of Care and Additional Recommendations:  Limitations on Scope of Treatment: Continue to treat the treatable but no CPR or intubation, okay with BiPAP  Code Status:    Code Status Orders        Start     Ordered   03/04/17 1642  Do not attempt resuscitation (DNR)  Continuous    Question Answer Comment  In the event of cardiac or respiratory ARREST Do not call a "code blue"   In the event of cardiac or respiratory ARREST Do not perform Intubation, CPR, defibrillation or ACLS   In the event of cardiac or respiratory ARREST Use medication by any route, position, wound care, and other measures to relive pain and suffering. May use oxygen, suction and manual treatment of airway obstruction as needed for comfort.      03/04/17 1654    Code Status History    Date Active Date Inactive Code Status Order ID Comments  User Context   03/04/2017 12:52 PM 03/04/2017  4:54 PM DNR [id number]  Nat Christen, MD ED   02/25/2017 12:49 PM 03/03/2017  7:51 PM Full Code [id number]  Roxan Hockey, MD Inpatient   07/24/2016 10:39 AM 07/26/2016  6:05 PM Full Code [id number]  Kathie Dike, MD Inpatient   05/09/2015  8:51 PM 05/10/2015  5:05 PM Full Code [id number]  Phillips Grout, MD Inpatient   11/16/2013 12:20 AM 11/17/2013  5:05 PM Full Code [id number]  Oswald Hillock, MD Inpatient       Prognosis:   < 6 months would not be surprising based on frailty, albumin of 2.0 on 03/05/17, COPD oxygen dependent on 4 L, pulmonary fibrosis.   Discharge Planning:  .The goal is to extend life/quality of life, return home with the benefits of hospice of [facility].   Care plan was discussed with nursing staff, case manager, and Dr. Carles Collet.  Thank you for allowing the Palliative Medicine Team to assist in the care of this patient.   Time In: 1240  Time Out: 1300  Total Time 20 minutes  Prolonged Time Billed  no       Greater than 50%  of this time was spent counseling and coordinating care related to the above assessment and plan.  Tasha A  Hulan Fray, NP  Please contact Palliative Medicine Team phone at [phone] for questions and concerns.

## 2017-03-09 NOTE — Consult Note (Signed)
   [facility] CM Inpatient Consult   03/09/2017  Edward [name] 1934/12/01 [id number]     Referral received from [place] Management office for [facility] member. Chart reviewed. It appears patient will go home with hospice services. [facility] Care Management not appropriate at this time.  Marthenia Rolling, MSN-Ed, RN,BSN [facility] Liaison [phone]

## 2017-03-09 NOTE — Care Management Important Message (Signed)
Important Message  Patient Details  Name: AKI ABALOS MRN: [id number] Date of Birth: June 14, 1935   Medicare Important Message Given:  Yes    Rishika Mccollom, Chauncey Reading, RN 03/09/2017, 9:41 AM

## 2017-03-09 NOTE — Care Management (Addendum)
[facility] received referral from CM. Making calls to wife for f/u. Will admit patient this weekend into care. CM will fax DC summary when available.   ADDENDUM: CM made Hospice aware that patient will DC tomorrow.

## 2017-03-09 NOTE — Progress Notes (Signed)
Subjective: He is overall about the same. He is still sleepy this morning. He says he feels okay. He has no new complaints  Objective: Vital signs in last 24 hours: Temp:  [97.5 F (36.4 C)-98.9 F (37.2 C)] 98.6 F (37 C) (06/08 0721) Pulse Rate:  [50-94] 58 (06/08 0700) Resp:  [14-29] 18 (06/08 0700) BP: (135-178)/(64-86) 152/77 (06/08 0700) SpO2:  [78 %-100 %] 94 % (06/08 0700) Weight:  [80.1 kg (176 lb 9.4 oz)] 80.1 kg (176 lb 9.4 oz) (06/08 0500) Weight change: 0.2 kg (7.1 oz) Last BM Date: 03/08/17  Intake/Output from previous day: 06/07 0701 - 06/08 0700 In: 640 [P.O.:490; IV Piggyback:150] Out: 875 [Urine:875]  PHYSICAL EXAM General appearance: Sleepy but arousable Resp: rhonchi bilaterally Cardio: regular rate and rhythm, S1, S2 normal, no murmur, click, rub or gallop GI: soft, non-tender; bowel sounds normal; no masses,  no organomegaly Extremities: extremities normal, atraumatic, no cyanosis or edema Skin warm and dry  Lab Results:  Results for orders placed or performed during the hospital encounter of 03/04/17 (from the past 48 hour(s))  Glucose, capillary     Status: Abnormal   Collection Time: 03/07/17  7:47 AM  Result Value Ref Range   Glucose-Capillary 236 (H) 65 - 99 mg/dL  Glucose, capillary     Status: Abnormal   Collection Time: 03/07/17 11:56 AM  Result Value Ref Range   Glucose-Capillary 324 (H) 65 - 99 mg/dL  Vancomycin, trough     Status: None   Collection Time: 03/07/17  1:13 PM  Result Value Ref Range   Vancomycin Tr 18 15 - 20 ug/mL  Glucose, capillary     Status: Abnormal   Collection Time: 03/07/17  4:41 PM  Result Value Ref Range   Glucose-Capillary 360 (H) 65 - 99 mg/dL  Glucose, capillary     Status: Abnormal   Collection Time: 03/07/17  8:53 PM  Result Value Ref Range   Glucose-Capillary 223 (H) 65 - 99 mg/dL  Glucose, capillary     Status: Abnormal   Collection Time: 03/08/17  8:16 AM  Result Value Ref Range   Glucose-Capillary 106 (H) 65 - 99 mg/dL  Glucose, capillary     Status: Abnormal   Collection Time: 03/08/17 11:41 AM  Result Value Ref Range   Glucose-Capillary 139 (H) 65 - 99 mg/dL   Comment 1 Notify RN    Comment 2 Document in Chart   Glucose, capillary     Status: Abnormal   Collection Time: 03/08/17  4:41 PM  Result Value Ref Range   Glucose-Capillary 176 (H) 65 - 99 mg/dL  Glucose, capillary     Status: Abnormal   Collection Time: 03/08/17  9:12 PM  Result Value Ref Range   Glucose-Capillary 280 (H) 65 - 99 mg/dL  Basic metabolic panel     Status: Abnormal   Collection Time: 03/09/17  4:30 AM  Result Value Ref Range   Sodium 136 135 - 145 mmol/[name]   Potassium 3.8 3.5 - 5.1 mmol/[name]   Chloride 98 ([name]) 101 - 111 mmol/[name]   CO2 32 22 - 32 mmol/[name]   Glucose, Bld 84 65 - 99 mg/dL   BUN 38 (H) 6 - 20 mg/dL   Creatinine, Ser 1.10 0.61 - 1.24 mg/dL   Calcium 9.3 8.9 - 10.3 mg/dL   GFR calc non Af Amer >60 >60 mL/min   GFR calc Af Amer >60 >60 mL/min    Comment: (NOTE) The eGFR has been calculated using the  CKD EPI equation. This calculation has not been validated in all clinical situations. eGFR's persistently <60 mL/min signify possible Chronic Kidney Disease.    Anion gap 6 5 - 15  Magnesium     Status: None   Collection Time: 03/09/17  4:30 AM  Result Value Ref Range   Magnesium 2.1 1.7 - 2.4 mg/dL    ABGS No results for input(s): PHART, PO2ART, TCO2, HCO3 in the last 72 hours.  Invalid input(s): PCO2 CULTURES Recent Results (from the past 240 hour(s))  Culture, blood (routine x 2)     Status: None (Preliminary result)   Collection Time: 03/04/17 11:52 AM  Result Value Ref Range Status   Specimen Description BLOOD LEFT ARM  Final   Special Requests   Final    Blood Culture results may not be optimal due to an inadequate volume of blood received in culture bottles   Culture NO GROWTH 4 DAYS  Final   Report Status PENDING  Incomplete  Culture, blood (routine x 2)      Status: None (Preliminary result)   Collection Time: 03/04/17  5:24 PM  Result Value Ref Range Status   Specimen Description BLOOD LEFT HAND  Final   Special Requests   Final    Blood Culture results may not be optimal due to an inadequate volume of blood received in culture bottles   Culture NO GROWTH 4 DAYS  Final   Report Status PENDING  Incomplete   Studies/Results: No results found.  Medications:  Prior to Admission:  Prescriptions Prior to Admission  Medication Sig Dispense Refill Last Dose  . albuterol (PROVENTIL) (2.5 MG/3ML) 0.083% nebulizer solution INHALE 1 VIAL VIA NEBULIZER EVERY 6 HOURS AS NEEDED FOR WHEEZING OR SHORTNESS OF BREATH 360 mL 5 unknown at unknown  . apixaban (ELIQUIS) 5 MG TABS tablet Take 1 tablet (5 mg total) by mouth 2 (two) times daily. 60 tablet 0 03/04/2017 at Unknown time  . budesonide (PULMICORT) 0.5 MG/2ML nebulizer solution Take 2 mLs (0.5 mg total) by nebulization 2 (two) times daily. 120 mL 5 unknown at unknown  . diltiazem (CARDIZEM CD) 240 MG 24 hr capsule Take 1 capsule (240 mg total) by mouth daily. 30 capsule 0 unknown at unknown  . furosemide (LASIX) 20 MG tablet Take 2 each morning. 60 tablet 3 Past Week at Unknown time  . insulin aspart protamine- aspart (NOVOLOG MIX 70/30) (70-30) 100 UNIT/ML injection Inject 0.18 mLs (18 Units total) into the skin daily with breakfast. 10 mL 11 unknown at unknown  . insulin NPH Human (NOVOLIN N) 100 UNIT/ML injection 18 units with breakfast daily and 13 units with supper daily 10 mL 0 03/04/2017 at Unknown time  . ipratropium (ATROVENT) 0.02 % nebulizer solution Take 2.5 mLs (0.5 mg total) by nebulization 4 (four) times daily. 25 mL 12 unknown at unknown  . losartan (COZAAR) 100 MG tablet Take 0.5 tablets (50 mg total) by mouth daily. 30 tablet 5 Past Week at Unknown time  . metFORMIN (GLUCOPHAGE) 1000 MG tablet Take 1 tablet (1,000 mg total) by mouth 2 (two) times daily. 60 tablet 5 Past Week at Unknown time  .  omeprazole (PRILOSEC) 20 MG capsule Take 1 capsule (20 mg total) by mouth daily. 30 capsule 3 Past Week at Unknown time  . pravastatin (PRAVACHOL) 80 MG tablet Take 1 tablet (80 mg total) by mouth at bedtime. 30 tablet 5 Past Week at Unknown time  . [DISCONTINUED] amoxicillin-clavulanate (AUGMENTIN) 875-125 MG tablet Take 1 tablet  by mouth every 12 (twelve) hours. 4 tablet 0 03/04/2017 at Unknown time  . [DISCONTINUED] azithromycin (ZITHROMAX) 250 MG tablet Take 2 tablets (500 mg total) by mouth daily. 6 each 0 03/04/2017 at Unknown time   Scheduled: . apixaban  5 mg Oral BID  . budesonide  0.5 mg Nebulization BID  . diltiazem  240 mg Oral Daily  . furosemide  20 mg Intravenous BID  . insulin aspart  0-20 Units Subcutaneous TID WC  . insulin aspart  0-5 Units Subcutaneous QHS  . insulin aspart  8 Units Subcutaneous TID WC  . insulin glargine  35 Units Subcutaneous Daily  . ipratropium  0.5 mg Nebulization TID  . levalbuterol  0.63 mg Nebulization TID  . mouth rinse  15 mL Mouth Rinse BID  . metoprolol tartrate  25 mg Oral BID  . pantoprazole  40 mg Oral Daily  . pravastatin  80 mg Oral QHS  . predniSONE  60 mg Oral Q breakfast   Continuous: . piperacillin-tazobactam (ZOSYN)  IV 3.375 g (03/09/17 0331)   PVF:AWNOPWKHIPRKS **OR** acetaminophen, ondansetron **OR** ondansetron (ZOFRAN) IV  Assesment: He was admitted with healthcare associated pneumonia probably from aspiration acute on chronic hypoxic respiratory failure atrial fib with rapid ventricular response and COPD exacerbation. He is slowly improving. He seems to be in sinus rhythm based on his monitor now. He has agreed to hospice care when he goes home. He will need 10 days more of oral antibiotics when he goes home. I would suggest Augmentin. He'll need to be on a steroid taper. Active Problems:   Diabetes mellitus type 2, uncontrolled ([name])   COPD (chronic obstructive pulmonary disease) ([name])   Pulmonary fibrosis ([name])   Acute on  chronic respiratory failure with hypoxia ([name])   Rt Sided Aspiration pneumonia ([name])   Atrial fibrillation with rapid ventricular response ([name])   Pneumonia   Chronic diastolic CHF (congestive heart failure) ([name])   HCAP (healthcare-associated pneumonia)   Acute on chronic respiratory failure ([name])   AF (paroxysmal atrial fibrillation) ([name])   Acute metabolic encephalopathy   Palliative care encounter   Goals of care, counseling/discussion   Encounter for hospice care discussion   Uncontrolled type 2 diabetes mellitus with hyperglycemia, with long-term current use of insulin ([name])   CKD (chronic kidney disease), stage III   Lobar pneumonia ([place])    Plan: Continue current treatments.    LOS: 5 days   Edward [name] 03/09/2017, 7:27 AM

## 2017-03-09 NOTE — Care Management (Addendum)
CM spoke with patient to follow on palliative NP discussions for hospice services. Patient is agreeable to going home with hospice services provided by [facility]. He has oxygen, CPAP, RW at home. Declines need for hospital bed at this time. Would like his wife to be point person for hospice to call-Carolyn [phone]. Patient is possible to DC later today 03/09/2017.   Stephani Janak RN [employer] [phone]

## 2017-03-10 DIAGNOSIS — I480 Paroxysmal atrial fibrillation: Secondary | ICD-10-CM

## 2017-03-10 LAB — BASIC METABOLIC PANEL
ANION GAP: 6 (ref 5–15)
BUN: 39 mg/dL — ABNORMAL HIGH (ref 6–20)
CHLORIDE: 95 mmol/L — AB (ref 101–111)
CO2: 34 mmol/L — AB (ref 22–32)
Calcium: 9.3 mg/dL (ref 8.9–10.3)
Creatinine, Ser: 1.11 mg/dL (ref 0.61–1.24)
GFR calc non Af Amer: >60 mL/min (ref >60)
Glucose, Bld: 53 mg/dL — ABNORMAL LOW (ref 65–99)
Potassium: 3.3 mmol/L — ABNORMAL LOW (ref 3.5–5.1)
SODIUM: 135 mmol/L (ref 135–145)

## 2017-03-10 LAB — GLUCOSE, CAPILLARY
GLUCOSE-CAPILLARY: 119 mg/dL — AB (ref 65–99)
GLUCOSE-CAPILLARY: 57 mg/dL — AB (ref 65–99)

## 2017-03-10 LAB — MAGNESIUM: MAGNESIUM: 2.1 mg/dL (ref 1.7–2.4)

## 2017-03-10 MED ORDER — METOPROLOL TARTRATE 25 MG PO TABS: 25.0000 mg | ORAL_TABLET | Freq: Two times a day (BID) | ORAL | 1 refills | Status: DC

## 2017-03-10 MED ORDER — POTASSIUM CHLORIDE CRYS ER 20 MEQ PO TBCR: 40.0000 meq | EXTENDED_RELEASE_TABLET | Freq: Once | ORAL | Status: DC

## 2017-03-10 MED ORDER — AMOXICILLIN-POT CLAVULANATE 875-125 MG PO TABS: 1.0000 | ORAL_TABLET | Freq: Two times a day (BID) | ORAL | 0 refills | Status: AC

## 2017-03-10 MED ORDER — INSULIN GLARGINE 100 UNIT/ML ~~LOC~~ SOLN
28.0000 [IU] | Freq: Every day | SUBCUTANEOUS | Status: DC
  Filled 2017-03-10 (×3): qty 0.28

## 2017-03-10 MED ORDER — INSULIN NPH (HUMAN) (ISOPHANE) 100 UNIT/ML ~~LOC~~ SUSP: SUBCUTANEOUS | 1 refills | Status: DC

## 2017-03-10 MED ORDER — PREDNISONE 10 MG PO TABS: 50.0000 mg | ORAL_TABLET | Freq: Every day | ORAL | 0 refills | Status: DC

## 2017-03-10 NOTE — Discharge Summary (Signed)
Physician Discharge Summary  AMATO SEVILLANO MWN:[id number] DOB: 1935-01-23 DOA: 03/04/2017  PCP: Orlena Sheldon, PA-C  Admit date: 03/04/2017 Discharge date: 03/10/2017  Admitted From: home Disposition:  Home   Recommendations for Outpatient Follow-up:  1. Follow up with PCP in 1-2 weeks 2. Please obtain BMP/CBC in one week    Home Health: YES Equipment/Devices:home with hospice  Discharge Condition: Stable CODE STATUS: DNR Diet recommendation: Heart Healthy / Carb Modified--Dysphagia 3 with thin   Brief/Interim Summary: 82 year old male with a history of COPD, CHF, pulmonary fibrosis and chronic respiratory failure on 3 L of oxygen, wasrecently in the hospital for pneumonia and insistedon being discharged home on 6/2. On 6/3, he required readmission for shortness of breath and confusion. He was found by EMS at home without any oxygen on and with oxygen saturations in the 70s. He was readmitted to the hospital for treatment of pneumonia, acute on chronic respiratory failure requiring BiPAP and atrial fibrillation with RVR.  Discharge Diagnoses:  Acute on chronic respiratory failure with hypoxia -Multifactorial including COPD exacerbation, acute on chronic diastolic CHF, aspiration pneumonia/HCAP, pulmonary fibrosis -Did not require BiPAP in the evening of 03/06/2017 or 03/07/17 and remained off thereafter -Weaned oxygen back to baseline 3 L -pulmonary hygiene  HCAP/Aspiration pneumonia -Continue Zosyn>>>amox/clav x 2 days to finish 7 days of tx -d/c vancomycin -Continue dysphagia 3 diet with thin liquids  Acute on chronic diastolic CHF -The patient remains clinically volume overloaded -Continue furosemide IV-->home with lasix 40 mg po daily -NEG 6.3L -Continue intravenous furosemide--give additional dose today IV -Previous Dry weight 160-163 -discharge weight 173 -02/26/2001 echo EF 60-65%, grade 2 DD, trivial MR -addedmetoprolol tartrate  COPD exacerbation/pulmonary  fibrosis -Continue intravenous Solu-Medrol-->de-escalate to prednisone on 03/09/17 -home with prednisone taper -Continue antibiotics -Continue bronchodilators -Continue Pulmicort -personally reviewed CXR--Increased left perihilar infiltrate, new right lower lobe infiltrate  Paroxysmal fibrillation with RVR -Continue diltiazem drip-->weaned off -increase diltiazem CD 240 mg daily -Secondary to COPD and infectious process  Diabetes mellitus type 2, uncontrolled -DecreaseLantus to 28 units due to mild hypoglycemia as steroids weaned -Continuepre-meal NovoLog to 4 units -Continue NovoLog sliding scale  CKD stage III -Baseline creatinine 1.0-1.3 -Monitor with diuresis  Essential hypertension -restartlosartan -continue metoprolol tartrate and diltiazem  Acute metabolic encephalopathy -Secondary to hypoxia and infectious process -Improved -Back to baseline  Anemia of chronic disease -Baseline hemoglobin 9  Goals of care -Patient is DO NOT RESUSCITATE -home with hospice -Appreciate palliative medicine follow-up   Discharge Instructions  Discharge Instructions    Diet - low sodium heart healthy    Complete by:  As directed    Increase activity slowly    Complete by:  As directed      Allergies as of 03/10/2017      Reactions   Ace Inhibitors Other (See Comments)   Hyperkalemia--07/23/2013:patient states not familiar with the following allergy      Medication List    STOP taking these medications   insulin aspart protamine- aspart (70-30) 100 UNIT/ML injection Commonly known as:  NOVOLOG MIX 70/30     TAKE these medications   albuterol (2.5 MG/3ML) 0.083% nebulizer solution Commonly known as:  PROVENTIL INHALE 1 VIAL VIA NEBULIZER EVERY 6 HOURS AS NEEDED FOR WHEEZING OR SHORTNESS OF BREATH   amoxicillin-clavulanate 875-125 MG tablet Commonly known as:  AUGMENTIN Take 1 tablet by mouth every 12 (twelve) hours.   apixaban 5 MG Tabs tablet Commonly  known as:  ELIQUIS Take 1 tablet (5 mg total) by  mouth 2 (two) times daily.   budesonide 0.5 MG/2ML nebulizer solution Commonly known as:  PULMICORT Take 2 mLs (0.5 mg total) by nebulization 2 (two) times daily.   diltiazem 240 MG 24 hr capsule Commonly known as:  CARDIZEM CD Take 1 capsule (240 mg total) by mouth daily.   furosemide 20 MG tablet Commonly known as:  LASIX Take 2 each morning.   insulin NPH Human 100 UNIT/ML injection Commonly known as:  NOVOLIN N 20 units with breakfast daily and 20 units with supper daily What changed:  additional instructions   ipratropium 0.02 % nebulizer solution Commonly known as:  ATROVENT Take 2.5 mLs (0.5 mg total) by nebulization 4 (four) times daily.   losartan 100 MG tablet Commonly known as:  COZAAR Take 0.5 tablets (50 mg total) by mouth daily.   metFORMIN 1000 MG tablet Commonly known as:  GLUCOPHAGE Take 1 tablet (1,000 mg total) by mouth 2 (two) times daily.   metoprolol tartrate 25 MG tablet Commonly known as:  LOPRESSOR Take 1 tablet (25 mg total) by mouth 2 (two) times daily.   omeprazole 20 MG capsule Commonly known as:  PRILOSEC Take 1 capsule (20 mg total) by mouth daily.   pravastatin 80 MG tablet Commonly known as:  PRAVACHOL Take 1 tablet (80 mg total) by mouth at bedtime.   predniSONE 10 MG tablet Commonly known as:  DELTASONE Take 5 tablets (50 mg total) by mouth daily with breakfast. And decrease by one tablet daily Start taking on:  03/11/2017       Allergies  Allergen Reactions  . Ace Inhibitors Other (See Comments)    Hyperkalemia--07/23/2013:patient states not familiar with the following allergy    Consultations:  Palliative medicine  Pulmonary medicine   Procedures/Studies: Dg Chest 2 View  Result Date: 02/27/2017 CLINICAL DATA:  Followup pneumonia.  Shortness of breath. EXAM: CHEST  2 VIEW COMPARISON:  02/25/2017 FINDINGS: Normal heart size. Small pleural effusions are identified.  Bilateral multifocal airspace consolidation is identified involving the right upper lobe, right lower lobe and left lower lobe. There maybe central cavitation within the left lower lobe opacification. There is mild diffuse edema noted. Prominence of the hilar structures are identified bilaterally. Cannot rule out adenopathy. IMPRESSION: 1. Bilateral multifocal airspace consolidation is again noted which in the acute setting is compatible with pneumonia. There may be new cavitation associated with left lower lobe airspace consolidation. Consider further evaluation with contrast enhanced CT of the chest. 2. Cannot rule out bilateral hilar adenopathy. Attention on CT is advised. Electronically Signed   By: Kerby Moors M.D.   On: 02/27/2017 10:48   Dg Abdomen 1 View  Result Date: 02/25/2017 CLINICAL DATA:  Abdominal pain. EXAM: ABDOMEN - 1 VIEW COMPARISON:  March 16, 2016 FINDINGS: Moderate fecal loading seen throughout the colon. No bowel obstruction is identified on today's study. No free air, portal venous gas, or pneumatosis. IMPRESSION: Moderate fecal loading in the colon.  No other abnormalities. Electronically Signed   By: Dorise Bullion III M.D   On: 02/25/2017 09:40   Ct Chest W Contrast  Result Date: 02/28/2017 CLINICAL DATA:  Admitted with pneumonia, shortness of breath and cough. EXAM: CT CHEST WITH CONTRAST TECHNIQUE: Multidetector CT imaging of the chest was performed during intravenous contrast administration. CONTRAST:  76mL ISOVUE-300 IOPAMIDOL (ISOVUE-300) INJECTION 61% COMPARISON:  Chest radiograph - 02/27/2017 ; 02/25/2017; 07/24/2016 FINDINGS: Cardiovascular: Borderline cardiomegaly. Coronary artery calcifications. No pericardial effusion. Mediastinum/Nodes: Mediastinal and hilar lymphadenopathy with index pretracheal lymph  node measuring 1.2 cm in greatest short axis diameter (59, series 20), index high right paratracheal lymph node measuring 0.9 cm (image 34, series 2), index right  infrahilar lymph node measuring 1.1 cm (image 87) and index left infrahilar lymph node measuring 1.2 cm (image 86). No axillary lymphadenopathy. Lungs/Pleura: Extensive bilateral masslike airspace opacities with index opacity with the right lower lobe measuring approximately 8.0 x 4.0 cm (image 97, series 4), index opacity within the right upper lobe measuring approximately 6.0 x 5.2 cm and index opacity within the superior segment of the left lower lobe measuring approximately 6.2 x 3.2 cm (image 87, series 4). All these masslike opacities are associated with rather extensive adjacent ground-glass. Small right and trace left-sided pleural effusions. There is narrowing of the bilateral lower lobe bronchi however the bronchial airways appear patent. Advanced apical predominant mixed centrilobular and paraseptal emphysematous change. No pneumothorax. Upper Abdomen: Evaluation of the upper abdomen is degraded secondary to patient respiratory artifact. Potential ill-defined stranding about the imaged cranial aspect of the gallbladder, could be artifactual due to patient motion. Atherosclerotic plaque with the abdominal aorta. The pancreas is largely fatty replaced. Musculoskeletal: No acute or aggressive osseous abnormalities. DDD within the image caudal aspect of the cervical spine. Degenerative change of the bilateral sternoclavicular joints. Regional soft tissues appear normal. Normal appearance of the thyroid gland. IMPRESSION: 1. Extensive bilateral masslike consolidative opacities with associated mediastinal and hilar lymphadenopathy, likely progressed compared to chest radiograph performed 02/25/2017 and worrisome for progression of extensive multifocal infection. Further evaluation with short-term follow-up contrast-enhanced chest CT (approximately 4-6 weeks after treatment) is recommended to ensure resolution and exclude the presence of a discrete underlying pulmonary mass. 2. Aortic Atherosclerosis  (ICD10-I70.0) and Emphysema (ICD10-J43.9). Electronically Signed   By: Sandi Mariscal M.D.   On: 02/28/2017 10:16   Dg Chest Port 1 View  Result Date: 03/04/2017 CLINICAL DATA:  Pulmonary fibrosis and pneumonia. EXAM: PORTABLE CHEST 1 VIEW COMPARISON:  CT scan 02/28/2017.  Chest x-ray 02/27/2017. FINDINGS: Focal airspace consolidation identified right mid lung. Right infrahilar airspace disease is new in the interval. There is persistent retrocardiac left base collapse/ consolidation. Underlying chronic interstitial changes suspected. Cardiopericardial silhouette is at upper limits of normal for size. The visualized bony structures of the thorax are intact. Telemetry leads overlie the chest. IMPRESSION: Multifocal airspace disease with some progression in the infrahilar right lower lobe, compatible with pneumonia. Electronically Signed   By: Misty Stanley M.D.   On: 03/04/2017 11:44   Dg Chest Port 1 View  Result Date: 02/25/2017 CLINICAL DATA:  Shortness of breath. EXAM: PORTABLE CHEST 1 VIEW COMPARISON:  July 24, 2016 FINDINGS: Infiltrates are seen in the right mid and medial right lower lung probably involving both the upper and lower lobes. No infiltrate seen on the left. The cardiomediastinal silhouette is normal. No pneumothorax. No other acute abnormalities. IMPRESSION: Multifocal right sided infiltrates suggesting pneumonia given history. Recommend treatment and follow-up to resolution. Electronically Signed   By: Dorise Bullion III M.D   On: 02/25/2017 09:36        Discharge Exam: Vitals:   03/09/17 2132 03/10/17 0457  BP: (!) 144/58 (!) 163/70  Pulse: 91 67  Resp: 16 18  Temp: 98.2 F (36.8 C) 98.7 F (37.1 C)   Vitals:   03/09/17 2132 03/10/17 0457 03/10/17 0845 03/10/17 0851  BP: (!) 144/58 (!) 163/70    Pulse: 91 67    Resp: 16 18    Temp: 98.2 F (36.8  C) 98.7 F (37.1 C)    TempSrc: Oral Oral    SpO2: 91%  97% 100%  Weight:      Height:        General: Pt is  alert, awake, not in acute distress Cardiovascular: RRR, S1/S2 +, no rubs, no gallops Respiratory: CTA bilaterally, no wheezing, no rhonchi Abdominal: Soft, NT, ND, bowel sounds + Extremities: no edema, no cyanosis   The results of significant diagnostics from this hospitalization (including imaging, microbiology, ancillary and laboratory) are listed below for reference.    Significant Diagnostic Studies: Dg Chest 2 View  Result Date: 02/27/2017 CLINICAL DATA:  Followup pneumonia.  Shortness of breath. EXAM: CHEST  2 VIEW COMPARISON:  02/25/2017 FINDINGS: Normal heart size. Small pleural effusions are identified. Bilateral multifocal airspace consolidation is identified involving the right upper lobe, right lower lobe and left lower lobe. There maybe central cavitation within the left lower lobe opacification. There is mild diffuse edema noted. Prominence of the hilar structures are identified bilaterally. Cannot rule out adenopathy. IMPRESSION: 1. Bilateral multifocal airspace consolidation is again noted which in the acute setting is compatible with pneumonia. There may be new cavitation associated with left lower lobe airspace consolidation. Consider further evaluation with contrast enhanced CT of the chest. 2. Cannot rule out bilateral hilar adenopathy. Attention on CT is advised. Electronically Signed   By: Kerby Moors M.D.   On: 02/27/2017 10:48   Dg Abdomen 1 View  Result Date: 02/25/2017 CLINICAL DATA:  Abdominal pain. EXAM: ABDOMEN - 1 VIEW COMPARISON:  March 16, 2016 FINDINGS: Moderate fecal loading seen throughout the colon. No bowel obstruction is identified on today's study. No free air, portal venous gas, or pneumatosis. IMPRESSION: Moderate fecal loading in the colon.  No other abnormalities. Electronically Signed   By: Dorise Bullion III M.D   On: 02/25/2017 09:40   Ct Chest W Contrast  Result Date: 02/28/2017 CLINICAL DATA:  Admitted with pneumonia, shortness of breath and  cough. EXAM: CT CHEST WITH CONTRAST TECHNIQUE: Multidetector CT imaging of the chest was performed during intravenous contrast administration. CONTRAST:  42mL ISOVUE-300 IOPAMIDOL (ISOVUE-300) INJECTION 61% COMPARISON:  Chest radiograph - 02/27/2017 ; 02/25/2017; 07/24/2016 FINDINGS: Cardiovascular: Borderline cardiomegaly. Coronary artery calcifications. No pericardial effusion. Mediastinum/Nodes: Mediastinal and hilar lymphadenopathy with index pretracheal lymph node measuring 1.2 cm in greatest short axis diameter (59, series 20), index high right paratracheal lymph node measuring 0.9 cm (image 34, series 2), index right infrahilar lymph node measuring 1.1 cm (image 87) and index left infrahilar lymph node measuring 1.2 cm (image 86). No axillary lymphadenopathy. Lungs/Pleura: Extensive bilateral masslike airspace opacities with index opacity with the right lower lobe measuring approximately 8.0 x 4.0 cm (image 97, series 4), index opacity within the right upper lobe measuring approximately 6.0 x 5.2 cm and index opacity within the superior segment of the left lower lobe measuring approximately 6.2 x 3.2 cm (image 87, series 4). All these masslike opacities are associated with rather extensive adjacent ground-glass. Small right and trace left-sided pleural effusions. There is narrowing of the bilateral lower lobe bronchi however the bronchial airways appear patent. Advanced apical predominant mixed centrilobular and paraseptal emphysematous change. No pneumothorax. Upper Abdomen: Evaluation of the upper abdomen is degraded secondary to patient respiratory artifact. Potential ill-defined stranding about the imaged cranial aspect of the gallbladder, could be artifactual due to patient motion. Atherosclerotic plaque with the abdominal aorta. The pancreas is largely fatty replaced. Musculoskeletal: No acute or aggressive osseous abnormalities. DDD within  the image caudal aspect of the cervical spine. Degenerative  change of the bilateral sternoclavicular joints. Regional soft tissues appear normal. Normal appearance of the thyroid gland. IMPRESSION: 1. Extensive bilateral masslike consolidative opacities with associated mediastinal and hilar lymphadenopathy, likely progressed compared to chest radiograph performed 02/25/2017 and worrisome for progression of extensive multifocal infection. Further evaluation with short-term follow-up contrast-enhanced chest CT (approximately 4-6 weeks after treatment) is recommended to ensure resolution and exclude the presence of a discrete underlying pulmonary mass. 2. Aortic Atherosclerosis (ICD10-I70.0) and Emphysema (ICD10-J43.9). Electronically Signed   By: Sandi Mariscal M.D.   On: 02/28/2017 10:16   Dg Chest Port 1 View  Result Date: 03/04/2017 CLINICAL DATA:  Pulmonary fibrosis and pneumonia. EXAM: PORTABLE CHEST 1 VIEW COMPARISON:  CT scan 02/28/2017.  Chest x-ray 02/27/2017. FINDINGS: Focal airspace consolidation identified right mid lung. Right infrahilar airspace disease is new in the interval. There is persistent retrocardiac left base collapse/ consolidation. Underlying chronic interstitial changes suspected. Cardiopericardial silhouette is at upper limits of normal for size. The visualized bony structures of the thorax are intact. Telemetry leads overlie the chest. IMPRESSION: Multifocal airspace disease with some progression in the infrahilar right lower lobe, compatible with pneumonia. Electronically Signed   By: Misty Stanley M.D.   On: 03/04/2017 11:44   Dg Chest Port 1 View  Result Date: 02/25/2017 CLINICAL DATA:  Shortness of breath. EXAM: PORTABLE CHEST 1 VIEW COMPARISON:  July 24, 2016 FINDINGS: Infiltrates are seen in the right mid and medial right lower lung probably involving both the upper and lower lobes. No infiltrate seen on the left. The cardiomediastinal silhouette is normal. No pneumothorax. No other acute abnormalities. IMPRESSION: Multifocal right  sided infiltrates suggesting pneumonia given history. Recommend treatment and follow-up to resolution. Electronically Signed   By: Dorise Bullion III M.D   On: 02/25/2017 09:36     Microbiology: Recent Results (from the past 240 hour(s))  Culture, blood (routine x 2)     Status: None   Collection Time: 03/04/17 11:52 AM  Result Value Ref Range Status   Specimen Description BLOOD LEFT ARM  Final   Special Requests   Final    Blood Culture results may not be optimal due to an inadequate volume of blood received in culture bottles   Culture NO GROWTH 5 DAYS  Final   Report Status 03/09/2017 FINAL  Final  Culture, blood (routine x 2)     Status: None   Collection Time: 03/04/17  5:24 PM  Result Value Ref Range Status   Specimen Description BLOOD LEFT HAND  Final   Special Requests   Final    Blood Culture results may not be optimal due to an inadequate volume of blood received in culture bottles   Culture NO GROWTH 5 DAYS  Final   Report Status 03/09/2017 FINAL  Final     Labs: Basic Metabolic Panel:  Recent Labs Lab 03/05/17 0433 03/06/17 0410 03/07/17 0434 03/09/17 0430 03/10/17 0614  NA 142 138 135 136 135  K 4.6 3.9 4.1 3.8 3.3*  CL 109 104 100* 98* 95*  CO2 22 27 28  32 34*  GLUCOSE 288* 214* 211* 84 53*  BUN 40* 39* 41* 38* 39*  CREATININE 1.19 1.18 1.14 1.10 1.11  CALCIUM 9.5 9.4 9.4 9.3 9.3  MG  --   --   --  2.1 2.1   Liver Function Tests:  Recent Labs Lab 03/05/17 0433 03/06/17 0410  AST 17 21  ALT 17 21  ALKPHOS 73 60  BILITOT 0.6 0.5  PROT 5.5* 5.7*  ALBUMIN 2.0* 2.6*   No results for input(s): LIPASE, AMYLASE in the last 168 hours. No results for input(s): AMMONIA in the last 168 hours. CBC:  Recent Labs Lab 03/04/17 1139 03/05/17 0433 03/06/17 0410 03/07/17 0434  WBC 28.5* 17.9* 15.9* 17.6*  NEUTROABS 25.4*  --   --   --   HGB 10.2* 9.6* 8.6* 9.5*  HCT 30.8* 29.3* 26.5* 29.3*  MCV 78.0 77.5* 78.9 78.6  PLT 605* 419* 542* 637*    Cardiac Enzymes:  Recent Labs Lab 03/04/17 1139  TROPONINI 0.04*   BNP: Invalid input(s): POCBNP CBG:  Recent Labs Lab 03/09/17 1117 03/09/17 1605 03/09/17 2136 03/10/17 0742 03/10/17 0829  GLUCAP 146* 156* 185* 57* 119*    Time coordinating discharge:  Greater than 30 minutes  Signed:  Demichael Traum, DO [facility] Pager: [phone] 03/10/2017, 10:44 AM

## 2017-03-12 ENCOUNTER — Telehealth: Payer: Self-pay

## 2017-03-12 ENCOUNTER — Other Ambulatory Visit: Payer: Self-pay | Admitting: Pharmacist

## 2017-03-12 ENCOUNTER — Other Ambulatory Visit: Payer: Self-pay | Admitting: *Deleted

## 2017-03-12 NOTE — Telephone Encounter (Signed)
noted 

## 2017-03-12 NOTE — Telephone Encounter (Signed)
Okay to give verbal and  to send me orders I assume hospice based on chart is for End Stage COPD, please clarify that

## 2017-03-12 NOTE — Telephone Encounter (Signed)
I spoke with [facility] and Olean Ree confirmed this is a plan of care for COPD

## 2017-03-12 NOTE — Telephone Encounter (Signed)
Edward [name] with [facility] is needing a verbal from a MD that they will act as primary physician for the pt because they can not have a PA sign off.  Edward [name] stated once they get a verbal they will then send over plan of care   Pls advise

## 2017-03-12 NOTE — Patient Outreach (Signed)
[facility]) Care Management  03/12/2017  LOVELLE LEMA Nov 16, 1934 [id number]   Telephone Screen  Referral Date: 02/23/17 Referral Source: MD office Dena Billet) Referral Reason: Venida Jarvis out pt assist forms for his medications Insurance: [facility] Medicare   Patient discharge Home with Hospice on 03/10/17. He is a DNR, per MD documentation.  Plan:  RN CM will notify [facility] CM administrative assistant regarding case closure.  Lake Bells, RN, BSN, MHA/MSL, [place] Telephonic Care Manager Coordinator [facility] Direct Phone: [phone] Toll Free: [phone] Fax: [phone]

## 2017-03-12 NOTE — Patient Outreach (Signed)
[facility]) Care Management  03/12/2017  Edward [name] 02-18-1935 [id number]  Noted via review of [facility] that patient recently discharged home with hospice therefore no longer involved with [facility] care management.    [facility] pharmacy will sign-off at this time.   Ralene Bathe, PharmD, [place] [phone]

## 2017-03-15 ENCOUNTER — Telehealth: Payer: Self-pay | Admitting: Physician Assistant

## 2017-03-16 ENCOUNTER — Telehealth: Payer: Self-pay

## 2017-03-16 NOTE — Telephone Encounter (Signed)
Edward [name] from [facility] called and wanted to see which medications the pt was on. I explained that I spoke with pt wife on 6/14 and discussed medications pt should be taking.  Since they are not sure I went ahead and moved his 74month f/u appt  sch for  7/11 appointment up to 6/20 and instructed pt to bring all medications so that we could see exactly what he was taking.  Pt was sleep I spoke with his wife she verbalized understanding

## 2017-03-19 ENCOUNTER — Other Ambulatory Visit: Payer: Self-pay | Admitting: Physician Assistant

## 2017-03-20 NOTE — Telephone Encounter (Signed)
Refill appropriate 

## 2017-03-21 ENCOUNTER — Ambulatory Visit (INDEPENDENT_AMBULATORY_CARE_PROVIDER_SITE_OTHER): Admitting: Physician Assistant

## 2017-03-21 VITALS — BP 142/70 | HR 79 | Temp 98.4°F | Resp 18 | Ht 66.0 in | Wt 160.0 lb

## 2017-03-21 DIAGNOSIS — Z09 Encounter for follow-up examination after completed treatment for conditions other than malignant neoplasm: Secondary | ICD-10-CM

## 2017-03-21 DIAGNOSIS — Z515 Encounter for palliative care: Secondary | ICD-10-CM

## 2017-03-21 DIAGNOSIS — A419 Sepsis, unspecified organism: Secondary | ICD-10-CM | POA: Diagnosis not present

## 2017-03-21 DIAGNOSIS — Z794 Long term (current) use of insulin: Secondary | ICD-10-CM

## 2017-03-21 DIAGNOSIS — J439 Emphysema, unspecified: Secondary | ICD-10-CM | POA: Diagnosis not present

## 2017-03-21 DIAGNOSIS — G4700 Insomnia, unspecified: Secondary | ICD-10-CM

## 2017-03-21 DIAGNOSIS — R600 Localized edema: Secondary | ICD-10-CM | POA: Diagnosis not present

## 2017-03-21 DIAGNOSIS — IMO0001 Reserved for inherently not codable concepts without codable children: Secondary | ICD-10-CM

## 2017-03-21 DIAGNOSIS — Z7189 Other specified counseling: Secondary | ICD-10-CM

## 2017-03-21 DIAGNOSIS — E1165 Type 2 diabetes mellitus with hyperglycemia: Secondary | ICD-10-CM | POA: Diagnosis not present

## 2017-03-21 DIAGNOSIS — J8410 Pulmonary fibrosis, unspecified: Secondary | ICD-10-CM

## 2017-03-21 DIAGNOSIS — I5032 Chronic diastolic (congestive) heart failure: Secondary | ICD-10-CM

## 2017-03-21 MED ORDER — FUROSEMIDE 20 MG PO TABS: ORAL_TABLET | ORAL | 3 refills | Status: DC

## 2017-03-21 MED ORDER — MIRTAZAPINE 30 MG PO TABS: 30.0000 mg | ORAL_TABLET | Freq: Every day | ORAL | 3 refills | Status: DC

## 2017-03-21 MED ORDER — POTASSIUM CHLORIDE CRYS ER 20 MEQ PO TBCR: EXTENDED_RELEASE_TABLET | ORAL | 2 refills | Status: DC

## 2017-03-21 NOTE — Progress Notes (Signed)
Patient ID: CHAPIN ARDUINI MRN: [id number], DOB: 03-Oct-1934, 82 y.o. Date of Encounter: @DATE @  Chief Complaint:  Chief Complaint  Patient presents with  . hospital f/u    HPI: 82 y.o. year old male  presents for above.   Usually he is always here alone by himself for his visits. Today his wife and his son are here with him.  I have reviewed his hospital discharge summaries. I reviewed the one from 02/25/17 through 03/04/17. I've also reviewed the one from 03/04/17 through 03/10/17.  He was admitted 02/25/17 with sepsis from pulmonary source presumed secondary to left-sided aspiration pneumonia in the setting of intractable emesis. On 02/25/17 after admission he became very tachycardic and had A. fib with RVR which responded well to IV Cardizem. Subsequently converted to sinus rhythm. He was seen by SLP and underwent barium swallow which showed no signs of aspiration but coughing with regular solid foods. Was started on dysphagia 3 diet. He was being very angry on day of discharge and felt as though he had been in the hospital too long. Attempts to discuss medications that were new were made with patient refused to engage in conversation. Call was placed to patient's family and his son stated that he would be present at time of discharge to discuss what medications patient needed. Son voiced understanding that the medications prescribed at discharge.  Review of the summary from 03/04/17-03/10/17 states that he was recently in the hospital for pneumonia and insistent on being discharged home on 6/2. On 6/3 he required readmission for shortness of breath and confusion. He was found by EMS at home without any oxygen on and with oxygen saturations in the 70s. Was readmitted to the hospital for treatment of pneumonia, acute on chronic respiratory failure requiring BiPAP and A. fib with RVR.  He was discharged home with DO NOT RESUSCITATE and also was home with hospice and had palliative medicine follow-up  during the hospitalization.    Today patient reports that his feet have been swelling since he has been home from the hospital. Today he reports that he needs something for sleep.  Today they do bring in his medicine bottles in a brown paper bag. Specifically reviewed that his bag does include current bottle with pills in the bottle of Lasix 20--2 every morning and potassium 20 mEq every morning. Also specifically reviewed that he currently is on no medications for insomnia.  He has no other specific concerns to address today.  They report that someone with hospice/palliative care has been coming to the house on average about every other day. Says that last week they had 3 or 4 home visits and they had one yesterday.  He also brings in paper where he has been documenting blood sugar readings. June 19 he has 2 readings of 136 and 214. June 20 one reading of 130. These are the only readings that have a date documented to correlate.   Past Medical History:  Diagnosis Date  . Allergy    Rhinitis  . Bronchitis   . Chronic respiratory failure ([place])   . Colon polyps   . COPD (chronic obstructive pulmonary disease) ([place])   . Diabetes mellitus   . Elevated lipids   . Hypercholesterolemia   . Hypertension   . On home O2    2L N/C   . PSA elevation   . Pulmonary fibrosis ([place])   . Vitamin D deficiency      Home Meds: Outpatient Medications Prior to  Visit  Medication Sig Dispense Refill  . albuterol (PROVENTIL) (2.5 MG/3ML) 0.083% nebulizer solution INHALE 1 VIAL VIA NEBULIZER EVERY 6 HOURS AS NEEDED FOR WHEEZING OR SHORTNESS OF BREATH 360 mL 5  . apixaban (ELIQUIS) 5 MG TABS tablet Take 1 tablet (5 mg total) by mouth 2 (two) times daily. 60 tablet 0  . budesonide (PULMICORT) 0.5 MG/2ML nebulizer solution Take 2 mLs (0.5 mg total) by nebulization 2 (two) times daily. 120 mL 5  . diltiazem (CARDIZEM CD) 240 MG 24 hr capsule Take 1 capsule (240 mg total) by mouth daily. 30 capsule 0  .  insulin NPH Human (NOVOLIN N) 100 UNIT/ML injection 20 units with breakfast daily and 20 units with supper daily 10 mL 1  . ipratropium (ATROVENT) 0.02 % nebulizer solution Take 2.5 mLs (0.5 mg total) by nebulization 4 (four) times daily. 25 mL 12  . metoprolol tartrate (LOPRESSOR) 25 MG tablet Take 1 tablet (25 mg total) by mouth 2 (two) times daily. 60 tablet 1  . ONETOUCH DELICA LANCETS 86P MISC USE TO CHECK BLOOD SUGAR TWICE DAILY AS DIRECTED 100 each 2  . predniSONE (DELTASONE) 10 MG tablet Take 5 tablets (50 mg total) by mouth daily with breakfast. And decrease by one tablet daily 15 tablet 0  . furosemide (LASIX) 20 MG tablet Take 2 each morning. 60 tablet 3  . losartan (COZAAR) 100 MG tablet Take 0.5 tablets (50 mg total) by mouth daily. (Patient not taking: Reported on 03/21/2017) 30 tablet 5  . losartan (COZAAR) 100 MG tablet TAKE ONE TABLET BY MOUTH DAILY. (Patient not taking: Reported on 03/21/2017) 30 tablet 0  . metFORMIN (GLUCOPHAGE) 1000 MG tablet TAKE ONE TABLET BY MOUTH TWO TIMES DAILY. (Patient not taking: Reported on 03/21/2017) 60 tablet 0  . omeprazole (PRILOSEC) 20 MG capsule Take 1 capsule (20 mg total) by mouth daily. (Patient not taking: Reported on 03/21/2017) 30 capsule 3  . pravastatin (PRAVACHOL) 80 MG tablet TAKE ONE TABLET BY MOUTH DAILY AT BEDTIME. (Patient not taking: Reported on 03/21/2017) 30 tablet 0   No facility-administered medications prior to visit.     Allergies:  Allergies  Allergen Reactions  . Ace Inhibitors Other (See Comments)    Hyperkalemia--07/23/2013:patient states not familiar with the following allergy    Social History   Social History  . Marital status: Married    Spouse name: N/A  . Number of children: N/A  . Years of education: N/A   Occupational History  . 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 Smoking status: Former Smoker    Packs/day: 1.50    Years: 60.00    Types: Cigarettes    Quit date: 12/31/2012    . Smokeless tobacco: Never Used  . Alcohol use No  . Drug use: No  . Sexual activity: Yes    Birth control/ protection: None   Other Topics Concern  . Not on file   Social History Narrative  . No narrative on file    Family History  Problem Relation Age of Onset  . Heart disease Mother   . CAD Other   . Diabetes Other      Review of Systems:  See HPI for pertinent ROS. All other ROS negative.    Physical Exam: Blood pressure (!) 142/70, pulse 79, temperature 98.4 F (36.9 C), temperature source Oral, resp. rate 18, height 5\' 6"  (1.676 m), weight 160 lb (72.6 kg), SpO2 95 %., Body mass  index is 25.82 kg/m. General: WM. Appears in no acute distress. Neck: Supple. No thyromegaly. No lymphadenopathy. Lungs: Distant, decreased breath sounds throughout but no wheezes rhonchi or rales. Heart: RRR with S1 S2. No murmurs, rubs, or gallops. Musculoskeletal:  Strength and tone normal for age. Extremities/Skin: 3+ pitting edema halfway up calves bilaterally. Neuro: Alert and oriented X 3. Moves all extremities spontaneously. Gait is normal. CNII-XII grossly in tact. Psych:  Responds to questions appropriately with a normal affect.     ASSESSMENT AND PLAN:  82 y.o. year old male with  1. Hospital discharge follow-up - CBC with Differential/Platelet - BASIC METABOLIC PANEL WITH GFR - Brain natriuretic peptide - potassium chloride SA (K-DUR,KLOR-CON) 20 MEQ tablet; Take one in morning. Take one in early afternoon.  Dispense: 60 tablet; Refill: 2 - furosemide (LASIX) 20 MG tablet; Take 2 each morning. Take 2 in early afternoon.  Dispense: 60 tablet; Refill: 3  2. Chronic diastolic heart failure ([name]) - BASIC METABOLIC PANEL WITH GFR - Brain natriuretic peptide - potassium chloride SA (K-DUR,KLOR-CON) 20 MEQ tablet; Take one in morning. Take one in early afternoon.  Dispense: 60 tablet; Refill: 2 - furosemide (LASIX) 20 MG tablet; Take 2 each morning. Take 2 in early afternoon.   Dispense: 60 tablet; Refill: 3  3. Pulmonary emphysema, unspecified emphysema type ([place])  4. Pulmonary fibrosis ([place])  5. Uncontrolled type 2 diabetes mellitus without complication, with long-term current use of insulin ([place])  6. Sepsis, due to unspecified organism ([place])  7. Palliative care encounter  8. Goals of care, counseling/discussion  9. Encounter for hospice care discussion  10. Bilateral lower extremity edema - BASIC METABOLIC PANEL WITH GFR - Brain natriuretic peptide - potassium chloride SA (K-DUR,KLOR-CON) 20 MEQ tablet; Take one in morning. Take one in early afternoon.  Dispense: 60 tablet; Refill: 2 - furosemide (LASIX) 20 MG tablet; Take 2 each morning. Take 2 in early afternoon.  Dispense: 60 tablet; Refill: 3  11. Insomnia, unspecified type - mirtazapine (REMERON) 30 MG tablet; Take 1 tablet (30 mg total) by mouth at bedtime.  Dispense: 30 tablet; Refill: 3  Obtain labs. Increase Lasix from 2 in the morning only to taking 2 twice a day. Increase potassium from taking 1 in the morning only to taking 1 twice a day Add Remeron for insomnia. Schedule follow-up office visit with me in one week to re-evaluate edema and insomnia.  Marin Olp [place], [place], [facility] 03/21/2017 3:17 PM

## 2017-03-22 LAB — BASIC METABOLIC PANEL WITH GFR
BUN: 21 mg/dL (ref 7–25)
CALCIUM: 8.4 mg/dL — AB (ref 8.6–10.3)
CHLORIDE: 97 mmol/L — AB (ref 98–110)
CO2: 27 mmol/L (ref 20–31)
CREATININE: 1.02 mg/dL (ref 0.70–1.18)
GFR, Est African American: 80 mL/min (ref >=60)
GFR, Est Non African American: 70 mL/min (ref >=60)
GLUCOSE: 254 mg/dL — AB (ref 70–99)
Potassium: 4.6 mmol/L (ref 3.5–5.3)
SODIUM: 132 mmol/L — AB (ref 135–146)

## 2017-03-22 LAB — CBC WITH DIFFERENTIAL/PLATELET
BASOS ABS: 0 {cells}/uL (ref 0–200)
Basophils Relative: 0 %
EOS ABS: 100 {cells}/uL (ref 15–500)
EOS PCT: 1 %
HCT: 30.7 % — ABNORMAL LOW (ref 38.5–50.0)
HEMOGLOBIN: 9.4 g/dL — AB (ref 13.0–17.0)
LYMPHS ABS: 1100 {cells}/uL (ref 850–3900)
Lymphocytes Relative: 11 %
MCH: 25.6 pg — ABNORMAL LOW (ref 27.0–33.0)
MCHC: 30.6 g/dL — AB (ref 32.0–36.0)
MCV: 83.7 fL (ref 80.0–100.0)
MPV: 8.9 fL (ref 7.5–12.5)
Monocytes Absolute: 300 cells/uL (ref 200–950)
Monocytes Relative: 3 %
NEUTROS PCT: 85 %
Neutro Abs: 8500 cells/uL — ABNORMAL HIGH (ref 1500–7800)
Platelets: 259 10*3/uL (ref 140–400)
RBC: 3.67 MIL/uL — ABNORMAL LOW (ref 4.20–5.80)
RDW: 17.9 % — ABNORMAL HIGH (ref 11.0–15.0)
WBC: 10 10*3/uL (ref 3.8–10.8)

## 2017-03-22 LAB — BRAIN NATRIURETIC PEPTIDE: Brain Natriuretic Peptide: 121.5 pg/mL — ABNORMAL HIGH (ref <100)

## 2017-03-28 ENCOUNTER — Ambulatory Visit (INDEPENDENT_AMBULATORY_CARE_PROVIDER_SITE_OTHER): Admitting: Physician Assistant

## 2017-03-28 ENCOUNTER — Encounter: Payer: Self-pay | Admitting: Physician Assistant

## 2017-03-28 VITALS — BP 138/72 | HR 82 | Temp 97.5°F | Resp 16 | Wt 156.4 lb

## 2017-03-28 DIAGNOSIS — I5032 Chronic diastolic (congestive) heart failure: Secondary | ICD-10-CM | POA: Diagnosis not present

## 2017-03-28 DIAGNOSIS — Z7189 Other specified counseling: Secondary | ICD-10-CM | POA: Diagnosis not present

## 2017-03-28 DIAGNOSIS — J9621 Acute and chronic respiratory failure with hypoxia: Secondary | ICD-10-CM | POA: Diagnosis not present

## 2017-03-28 DIAGNOSIS — N183 Chronic kidney disease, stage 3 unspecified: Secondary | ICD-10-CM

## 2017-03-28 DIAGNOSIS — IMO0001 Reserved for inherently not codable concepts without codable children: Secondary | ICD-10-CM

## 2017-03-28 DIAGNOSIS — J181 Lobar pneumonia, unspecified organism: Secondary | ICD-10-CM

## 2017-03-28 DIAGNOSIS — J8410 Pulmonary fibrosis, unspecified: Secondary | ICD-10-CM

## 2017-03-28 DIAGNOSIS — J439 Emphysema, unspecified: Secondary | ICD-10-CM | POA: Diagnosis not present

## 2017-03-28 DIAGNOSIS — E1165 Type 2 diabetes mellitus with hyperglycemia: Secondary | ICD-10-CM | POA: Diagnosis not present

## 2017-03-28 DIAGNOSIS — Z515 Encounter for palliative care: Secondary | ICD-10-CM

## 2017-03-28 DIAGNOSIS — G4700 Insomnia, unspecified: Secondary | ICD-10-CM | POA: Insufficient documentation

## 2017-03-28 DIAGNOSIS — Z794 Long term (current) use of insulin: Secondary | ICD-10-CM

## 2017-03-28 NOTE — Progress Notes (Signed)
Patient ID: Edward [name] MRN: [id number], DOB: 04-May-1935, 81 y.o. Date of Encounter: _0 @  Chief Complaint:  Chief Complaint  Patient presents with  . 1 wk follow up    HPI: 82 y.o. year old male  presents for above.    03/21/2017: Usually he is always here alone by himself for his visits. Today his wife and his son are here with him.  I have reviewed his hospital discharge summaries. I reviewed the one from 02/25/17 through 03/04/17. I've also reviewed the one from 03/04/17 through 03/10/17.  He was admitted 02/25/17 with sepsis from pulmonary source presumed secondary to left-sided aspiration pneumonia in the setting of intractable emesis. On 02/25/17 after admission he became very tachycardic and had A. fib with RVR which responded well to IV Cardizem. Subsequently converted to sinus rhythm. He was seen by SLP and underwent barium swallow which showed no signs of aspiration but coughing with regular solid foods. Was started on dysphagia 3 diet. He was being very angry on day of discharge and felt as though he had been in the hospital too long. Attempts to discuss medications that were new were made with patient refused to engage in conversation. Call was placed to patient's family and his son stated that he would be present at time of discharge to discuss what medications patient needed. Son voiced understanding that the medications prescribed at discharge.  Review of the summary from 03/04/17-03/10/17 states that he was recently in the hospital for pneumonia and insistent on being discharged home on 6/2. On 6/3 he required readmission for shortness of breath and confusion. He was found by EMS at home without any oxygen on and with oxygen saturations in the 70s. Was readmitted to the hospital for treatment of pneumonia, acute on chronic respiratory failure requiring BiPAP and A. fib with RVR.  He was discharged home with DO NOT RESUSCITATE and also was home with hospice and had palliative  medicine follow-up during the hospitalization.    Today patient reports that his feet have been swelling since he has been home from the hospital. Today he reports that he needs something for sleep.  Today they do bring in his medicine bottles in a brown paper bag. Specifically reviewed that his bag does include current bottle with pills in the bottle of Lasix 20--2 every morning and potassium 20 mEq every morning. Also specifically reviewed that he currently is on no medications for insomnia.  He has no other specific concerns to address today.  They report that someone with hospice/palliative care has been coming to the house on average about every other day. Says that last week they had 3 or 4 home visits and they had one yesterday.  He also brings in paper where he has been documenting blood sugar readings. June 19 he has 2 readings of 136 and 214. June 20 one reading of 130. These are the only readings that have a date documented to correlate.    - CBC with Differential/Platelet - BASIC METABOLIC PANEL WITH GFR - Brain natriuretic peptide - potassium chloride SA (K-DUR,KLOR-CON) 20 MEQ tablet; Take one in morning. Take one in early afternoon.  Dispense: 60 tablet; Refill: 2 - furosemide (LASIX) 20 MG tablet; Take 2 each morning. Take 2 in early afternoon.  Dispense: 60 tablet; Refill: 3   Insomnia, unspecified type - mirtazapine (REMERON) 30 MG tablet; Take 1 tablet (30 mg total) by mouth at bedtime.  Dispense: 30 tablet; Refill: 3  Obtain labs. Increase  Lasix from 2 in the morning only to taking 2 twice a day. Increase potassium from taking 1 in the morning only to taking 1 twice a day Add Remeron for insomnia. Schedule follow-up office visit with me in one week to re-evaluate edema and insomnia.   03/28/2017: Wife and son accompany him for office visit again today.  Labs drawn at last office visit on 03/21/17 came back good. BNP was only slightly elevated at 121.5. CBC  showed white count back down to normal. Anemia was stable. Bmet was good. Kidney function back to normal with BUN 21 creatinine 1.02. GFR normal. At that time told him to increase protein in his diet to help with the lower extremity edema.  Today he does bring in fasting blood sugar readings--- the following are the recent readings from the past week-----he asked why his blood sugars are up and down/fluctuating so much-----wife states that he has been eating a lot of at sandwiches. Says that he likes to put it on pieces of white bread to make a sandwich. Says he also has been eating some boiled eggs. I then discussed other things he may be eating that would be causing sugar to go up at times but he says he has been eating about the same on each day and doesn't think he has been [name] more carbohydrates on nights prior to fasting blood sugars being high. Wife states that they do not eat pasta as far spaghetti or lasagna or pizza with pizza crust. Asked patient if he is snacking and he says no he doesn't have anything to snack on. Sounds like he is mostly eating the exam which is. 311, 358, 375, 124, 230, 259, 195, 133  He states that his swelling is improved with taking the Lasix and potassium as directed at last office visit.  He states that the insomnia medicine is working very well. He is getting good sleep now and the medicine is causing no side effects. Wife adds "he must be sleeping good he isn't waking [place] up anymore ".  He has no other concerns to address today.  They report that a nurse came to the house yesterday and checked on him and that another nurses was to be coming again sometime this week.  Past Medical History:  Diagnosis Date  . Allergy    Rhinitis  . Bronchitis   . Chronic respiratory failure ([place])   . Colon polyps   . COPD (chronic obstructive pulmonary disease) ([place])   . Diabetes mellitus   . Elevated lipids   . Hypercholesterolemia   . Hypertension   . On home O2     2L N/C   . PSA elevation   . Pulmonary fibrosis ([place])   . Vitamin D deficiency      Home Meds: Outpatient Medications Prior to Visit  Medication Sig Dispense Refill  . albuterol (PROVENTIL) (2.5 MG/3ML) 0.083% nebulizer solution INHALE 1 VIAL VIA NEBULIZER EVERY 6 HOURS AS NEEDED FOR WHEEZING OR SHORTNESS OF BREATH 360 mL 5  . apixaban (ELIQUIS) 5 MG TABS tablet Take 1 tablet (5 mg total) by mouth 2 (two) times daily. 60 tablet 0  . baclofen (LIORESAL) 10 MG tablet Take 10 mg by mouth 3 (three) times daily.    . budesonide (PULMICORT) 0.5 MG/2ML nebulizer solution Take 2 mLs (0.5 mg total) by nebulization 2 (two) times daily. 120 mL 5  . diltiazem (CARDIZEM CD) 240 MG 24 hr capsule Take 1 capsule (240 mg total) by  mouth daily. 30 capsule 0  . furosemide (LASIX) 20 MG tablet Take 2 each morning. Take 2 in early afternoon. 60 tablet 3  . insulin NPH Human (NOVOLIN N) 100 UNIT/ML injection 20 units with breakfast daily and 20 units with supper daily 10 mL 1  . ipratropium (ATROVENT) 0.02 % nebulizer solution Take 2.5 mLs (0.5 mg total) by nebulization 4 (four) times daily. 25 mL 12  . losartan (COZAAR) 100 MG tablet TAKE ONE TABLET BY MOUTH DAILY. 30 tablet 0  . metFORMIN (GLUCOPHAGE) 1000 MG tablet TAKE ONE TABLET BY MOUTH TWO TIMES DAILY. 60 tablet 0  . metoprolol tartrate (LOPRESSOR) 25 MG tablet Take 1 tablet (25 mg total) by mouth 2 (two) times daily. 60 tablet 1  . mirtazapine (REMERON) 30 MG tablet Take 1 tablet (30 mg total) by mouth at bedtime. 30 tablet 3  . omeprazole (PRILOSEC) 20 MG capsule Take 1 capsule (20 mg total) by mouth daily. 30 capsule 3  . ONETOUCH DELICA LANCETS 19E MISC USE TO CHECK BLOOD SUGAR TWICE DAILY AS DIRECTED 100 each 2  . potassium chloride SA (K-DUR,KLOR-CON) 20 MEQ tablet Take one in morning. Take one in early afternoon. 60 tablet 2  . pravastatin (PRAVACHOL) 80 MG tablet TAKE ONE TABLET BY MOUTH DAILY AT BEDTIME. 30 tablet 0  . predniSONE (DELTASONE) 10 MG  tablet Take 5 tablets (50 mg total) by mouth daily with breakfast. And decrease by one tablet daily 15 tablet 0  . losartan (COZAAR) 100 MG tablet Take 0.5 tablets (50 mg total) by mouth daily. (Patient not taking: Reported on 03/21/2017) 30 tablet 5   No facility-administered medications prior to visit.     Allergies:  Allergies  Allergen Reactions  . Ace Inhibitors Other (See Comments)    Hyperkalemia--07/23/2013:patient states not familiar with the following allergy    Social History   Social History  . Marital status: Married    Spouse name: N/A  . Number of children: N/A  . Years of education: N/A   Occupational History  . 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 Smoking status: Former Smoker    Packs/day: 1.50    Years: 60.00    Types: Cigarettes    Quit date: 12/31/2012  . Smokeless tobacco: Never Used  . Alcohol use No  . Drug use: No  . Sexual activity: Yes    Birth control/ protection: None   Other Topics Concern  . Not on file   Social History Narrative  . No narrative on file    Family History  Problem Relation Age of Onset  . Heart disease Mother   . CAD Other   . Diabetes Other      Review of Systems:  See HPI for pertinent ROS. All other ROS negative.    Physical Exam: Blood pressure 138/72, pulse 82, temperature 97.5 F (36.4 C), temperature source Oral, resp. rate 16, weight 156 lb 6.4 oz (70.9 kg), SpO2 96 %., Body mass index is 25.24 kg/m. General: WM. Appears in no acute distress. Neck: Supple. No thyromegaly. No lymphadenopathy. Lungs: Distant, decreased breath sounds throughout but no wheezes rhonchi or rales. Heart: RRR with S1 S2. No murmurs, rubs, or gallops. Musculoskeletal:  Strength and tone normal for age. Extremities/Skin: Left Foot and Lower Extremity----now with NO edema !! Right Foot and up to ~ 1 inch above ankle level--- 1+ pitting edema. No edema higher than 1 inch above ankle level. Neuro:  Alert and  oriented X 3. Moves all extremities spontaneously. Gait is normal. CNII-XII grossly in tact. Psych:  Responds to questions appropriately with a normal affect.     ASSESSMENT AND PLAN:  82 y.o. year old male with   1. Chronic diastolic heart failure ([name]) - BASIC METABOLIC PANEL WITH GFR  2. Pulmonary emphysema, unspecified emphysema type ([place])  3. Pulmonary fibrosis ([place])  4. Acute on chronic respiratory failure with hypoxia ([name])  5. Lobar pneumonia ([place])  6. Uncontrolled type 2 diabetes mellitus without complication, with long-term current use of insulin ([place]) --Will continue current dose of Insulin for now.  He has had a couple of readings down towards 120 -130 and I definitely do not want him to get hypoglycemic. Discussed trying to make sure that his carb intake remains the same from day-to-day. 7. CKD (chronic kidney disease), stage III - BASIC METABOLIC PANEL WITH GFR  8. Palliative care encounter  9. Goals of care, counseling/discussion  10. Encounter for hospice care discussion  11. Insomnia ---Controlled on current med--Remeron  Will have him continue current dosing of Lasix and potassium. Will check B met to monitor. Will have him return for follow-up office visit in one week. If everything is stable at that visit then can start stretching out visits with more time between visits.     Marin Olp [place], [place], [facility] 03/28/2017 8:32 AM

## 2017-03-29 LAB — BASIC METABOLIC PANEL WITHOUT GFR
BUN: 29 mg/dL — ABNORMAL HIGH (ref 7–25)
CO2: 30 mmol/L (ref 20–31)
Calcium: 9.1 mg/dL (ref 8.6–10.3)
Chloride: 92 mmol/L — ABNORMAL LOW (ref 98–110)
Creat: 1.03 mg/dL (ref 0.70–1.18)
GFR, Est African American: 79 mL/min
GFR, Est Non African American: 69 mL/min
Glucose, Bld: 163 mg/dL — ABNORMAL HIGH (ref 70–99)
Potassium: 5 mmol/L (ref 3.5–5.3)
Sodium: 133 mmol/L — ABNORMAL LOW (ref 135–146)

## 2017-04-05 ENCOUNTER — Other Ambulatory Visit: Payer: Self-pay

## 2017-04-05 ENCOUNTER — Encounter: Payer: Self-pay | Admitting: Physician Assistant

## 2017-04-05 ENCOUNTER — Ambulatory Visit (INDEPENDENT_AMBULATORY_CARE_PROVIDER_SITE_OTHER): Admitting: Physician Assistant

## 2017-04-05 ENCOUNTER — Other Ambulatory Visit: Payer: Self-pay | Admitting: Physician Assistant

## 2017-04-05 VITALS — BP 132/60 | HR 74 | Temp 98.2°F | Resp 14 | Ht 66.0 in | Wt 160.6 lb

## 2017-04-05 DIAGNOSIS — I5032 Chronic diastolic (congestive) heart failure: Secondary | ICD-10-CM

## 2017-04-05 DIAGNOSIS — J8410 Pulmonary fibrosis, unspecified: Secondary | ICD-10-CM

## 2017-04-05 DIAGNOSIS — J439 Emphysema, unspecified: Secondary | ICD-10-CM

## 2017-04-05 NOTE — Progress Notes (Signed)
Patient ID: Edward [name] MRN: [id number], DOB: [birth year], 82 y.o. Date of Encounter: @DATE @  Chief Complaint:  Chief Complaint  Patient presents with  . 1 week routine follow up  . Fatigue    HPI: 82 y.o. year old male  presents for above.    03/21/2017: Usually he is always here alone by himself for his visits. Today his wife and his son are here with him.  I have reviewed his hospital discharge summaries. I reviewed the one from 02/25/17 through 03/04/17. I've also reviewed the one from 03/04/17 through 03/10/17.  He was admitted 02/25/17 with sepsis from pulmonary source presumed secondary to left-sided aspiration pneumonia in the setting of intractable emesis. On 02/25/17 after admission he became very tachycardic and had A. fib with RVR which responded well to IV Cardizem. Subsequently converted to sinus rhythm. He was seen by SLP and underwent barium swallow which showed no signs of aspiration but coughing with regular solid foods. Was started on dysphagia 3 diet. He was being very angry on day of discharge and felt as though he had been in the hospital too long. Attempts to discuss medications that were new were made with patient refused to engage in conversation. Call was placed to patient's family and his son stated that he would be present at time of discharge to discuss what medications patient needed. Son voiced understanding that the medications prescribed at discharge.  Review of the summary from 03/04/17-03/10/17 states that he was recently in the hospital for pneumonia and insistent on being discharged home on 6/2. On 6/3 he required readmission for shortness of breath and confusion. He was found by EMS at home without any oxygen on and with oxygen saturations in the 70s. Was readmitted to the hospital for treatment of pneumonia, acute on chronic respiratory failure requiring BiPAP and A. fib with RVR.  He was discharged home with DO NOT RESUSCITATE and also was home with hospice and  had palliative medicine follow-up during the hospitalization.    Today patient reports that his feet have been swelling since he has been home from the hospital. Today he reports that he needs something for sleep.  Today they do bring in his medicine bottles in a brown paper bag. Specifically reviewed that his bag does include current bottle with pills in the bottle of Lasix 20--2 every morning and potassium 20 mEq every morning. Also specifically reviewed that he currently is on no medications for insomnia.  He has no other specific concerns to address today.  They report that someone with hospice/palliative care has been coming to the house on average about every other day. Says that last week they had 3 or 4 home visits and they had one yesterday.  He also brings in paper where he has been documenting blood sugar readings. June 19 he has 2 readings of 136 and 214. June 20 one reading of 130. These are the only readings that have a date documented to correlate.    - CBC with Differential/Platelet - BASIC METABOLIC PANEL WITH GFR - Brain natriuretic peptide - potassium chloride SA (K-DUR,KLOR-CON) 20 MEQ tablet; Take one in morning. Take one in early afternoon.  Dispense: 60 tablet; Refill: 2 - furosemide (LASIX) 20 MG tablet; Take 2 each morning. Take 2 in early afternoon.  Dispense: 60 tablet; Refill: 3   Insomnia, unspecified type - mirtazapine (REMERON) 30 MG tablet; Take 1 tablet (30 mg total) by mouth at bedtime.  Dispense: 30 tablet; Refill: 3  Obtain labs. Increase Lasix from 2 in the morning only to taking 2 twice a day. Increase potassium from taking 1 in the morning only to taking 1 twice a day Add Remeron for insomnia. Schedule follow-up office visit with me in one week to re-evaluate edema and insomnia.    03/28/2017: Wife and son accompany him for office visit again today.  Labs drawn at last office visit on 03/21/17 came back good. BNP was only slightly elevated at  121.5. CBC showed white count back down to normal. Anemia was stable. Bmet was good. Kidney function back to normal with BUN 21 creatinine 1.02. GFR normal. At that time told him to increase protein in his diet to help with the lower extremity edema.  Today he does bring in fasting blood sugar readings--- the following are the recent readings from the past week-----he asked why his blood sugars are up and down/fluctuating so much-----wife states that he has been eating a lot of at sandwiches. Says that he likes to put it on pieces of white bread to make a sandwich. Says he also has been eating some boiled eggs. I then discussed other things he may be eating that would be causing sugar to go up at times but he says he has been eating about the same on each day and doesn't think he has been [name] more carbohydrates on nights prior to fasting blood sugars being high. Wife states that they do not eat pasta as far spaghetti or lasagna or pizza with pizza crust. Asked patient if he is snacking and he says no he doesn't have anything to snack on. Sounds like he is mostly eating the exam which is. 311, 358, 375, 124, 230, 259, 195, 133  He states that his swelling is improved with taking the Lasix and potassium as directed at last office visit.  He states that the insomnia medicine is working very well. He is getting good sleep now and the medicine is causing no side effects. Wife adds "he must be sleeping good he isn't waking [place] up anymore ".  He has no other concerns to address today.  They report that a nurse came to the house yesterday and checked on him and that another nurses was to be coming again sometime this week.    04/05/2017: His wife accompanies him for a visit today. His son is not here to today. Patient states that he has no swelling-- that has stayed resolved. He has no specific concerns today. He does bring in blood sugar log sheet and has the following blood sugars documented : 138, 163,  125, 162, 113, 123, 124.  I asked if they were still having nurses come to the house frequently. He and his wife both state that they have not been having many home visits recently. Says that there has been one man from hospice that has still come but that is all.    Past Medical History:  Diagnosis Date  . Allergy    Rhinitis  . Bronchitis   . Chronic respiratory failure ([place])   . Colon polyps   . COPD (chronic obstructive pulmonary disease) ([place])   . Diabetes mellitus   . Elevated lipids   . Hypercholesterolemia   . Hypertension   . On home O2    2L N/C   . PSA elevation   . Pulmonary fibrosis ([place])   . Vitamin D deficiency      Home Meds: Outpatient Medications Prior to Visit  Medication  Sig Dispense Refill  . albuterol (PROVENTIL) (2.5 MG/3ML) 0.083% nebulizer solution INHALE 1 VIAL VIA NEBULIZER EVERY 6 HOURS AS NEEDED FOR WHEEZING OR SHORTNESS OF BREATH 360 mL 5  . baclofen (LIORESAL) 10 MG tablet Take 10 mg by mouth 3 (three) times daily.    . budesonide (PULMICORT) 0.5 MG/2ML nebulizer solution Take 2 mLs (0.5 mg total) by nebulization 2 (two) times daily. 120 mL 5  . diltiazem (CARDIZEM CD) 240 MG 24 hr capsule Take 1 capsule (240 mg total) by mouth daily. 30 capsule 0  . furosemide (LASIX) 20 MG tablet Take 2 each morning. Take 2 in early afternoon. 60 tablet 3  . insulin NPH Human (NOVOLIN N) 100 UNIT/ML injection 20 units with breakfast daily and 20 units with supper daily 10 mL 1  . ipratropium (ATROVENT) 0.02 % nebulizer solution Take 2.5 mLs (0.5 mg total) by nebulization 4 (four) times daily. 25 mL 12  . losartan (COZAAR) 100 MG tablet TAKE ONE TABLET BY MOUTH DAILY. 30 tablet 0  . metFORMIN (GLUCOPHAGE) 1000 MG tablet TAKE ONE TABLET BY MOUTH TWO TIMES DAILY. 60 tablet 0  . metoprolol tartrate (LOPRESSOR) 25 MG tablet Take 1 tablet (25 mg total) by mouth 2 (two) times daily. 60 tablet 1  . mirtazapine (REMERON) 30 MG tablet Take 1 tablet (30 mg total) by mouth  at bedtime. 30 tablet 3  . omeprazole (PRILOSEC) 20 MG capsule Take 1 capsule (20 mg total) by mouth daily. 30 capsule 3  . ONETOUCH DELICA LANCETS 01U MISC USE TO CHECK BLOOD SUGAR TWICE DAILY AS DIRECTED 100 each 2  . potassium chloride SA (K-DUR,KLOR-CON) 20 MEQ tablet Take one in morning. Take one in early afternoon. 60 tablet 2  . pravastatin (PRAVACHOL) 80 MG tablet TAKE ONE TABLET BY MOUTH DAILY AT BEDTIME. 30 tablet 0  . predniSONE (DELTASONE) 10 MG tablet Take 5 tablets (50 mg total) by mouth daily with breakfast. And decrease by one tablet daily 15 tablet 0  . apixaban (ELIQUIS) 5 MG TABS tablet Take 1 tablet (5 mg total) by mouth 2 (two) times daily. 60 tablet 0   No facility-administered medications prior to visit.     Allergies:  Allergies  Allergen Reactions  . Ace Inhibitors Other (See Comments)    Hyperkalemia--07/23/2013:patient states not familiar with the following allergy    Social History   Social History  . Marital status: Married    Spouse name: N/A  . Number of children: N/A  . Years of education: N/A   Occupational History  . 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 Smoking status: Former Smoker    Packs/day: 1.50    Years: 60.00    Types: Cigarettes    Quit date: 12/31/2012  . Smokeless tobacco: Never Used  . Alcohol use No  . Drug use: No  . Sexual activity: Yes    Birth control/ protection: None   Other Topics Concern  . Not on file   Social History Narrative  . No narrative on file    Family History  Problem Relation Age of Onset  . Heart disease Mother   . CAD Other   . Diabetes Other      Review of Systems:  See HPI for pertinent ROS. All other ROS negative.    Physical Exam: Blood pressure 132/60, pulse 74, temperature 98.2 F (36.8 C), temperature source Oral, resp. rate 14, height 5\' 6"  (1.676 m), weight  160 lb 9.6 oz (72.8 kg), SpO2 95 %., Body mass index is 25.92 kg/m. General: WM. Appears in no acute  distress. Neck: Supple. No thyromegaly. No lymphadenopathy. Lungs: Distant, decreased breath sounds throughout but no wheezes rhonchi or rales. Heart: RRR with S1 S2. No murmurs, rubs, or gallops. Musculoskeletal:  Strength and tone normal for age. Extremities/Skin: Bilateral Feet and Lower Extremities----now with NO edema !! Neuro: Alert and oriented X 3. Moves all extremities spontaneously. Gait is normal. CNII-XII grossly in tact. Psych:  Responds to questions appropriately with a normal affect.     ASSESSMENT AND PLAN:  82 y.o. year old male with   1. Chronic diastolic heart failure ([name]) - BASIC METABOLIC PANEL WITH GFR  2. Pulmonary emphysema, unspecified emphysema type ([place])  3. Pulmonary fibrosis ([place])  4. Acute on chronic respiratory failure with hypoxia ([name])  5. Lobar pneumonia ([place])  6. Uncontrolled type 2 diabetes mellitus without complication, with long-term current use of insulin ([place]) BS readings at [facility] 04/05/2017 are much better and more consistent-----Will continue current dose of Insulin for now 7. CKD (chronic kidney disease), stage III - BASIC METABOLIC PANEL WITH GFR  8. Palliative care encounter  9. Goals of care, counseling/discussion  10. Encounter for hospice care discussion  11. Insomnia ---Controlled on current med--Remeron  Will have him continue current dosing of Lasix and potassium. Will check Bmet to monitor.  If this lab comes back stable and if he continues to feel stable then will plan for him to wait for follow-up office visit in one month.  If today's lab shows any issues then will address accordingly. As well I have told both patient and his wife that if he has any issues arise they can always call and schedule to come in sooner. Otherwise if things are stable can wait till follow-up one month.     Signed, [address] [place], [place], [facility] 04/05/2017 9:37 AM

## 2017-04-06 ENCOUNTER — Other Ambulatory Visit: Payer: Self-pay

## 2017-04-06 LAB — BASIC METABOLIC PANEL WITH GFR
BUN: 33 mg/dL — ABNORMAL HIGH (ref 7–25)
CHLORIDE: 94 mmol/L — AB (ref 98–110)
CO2: 28 mmol/L (ref 20–31)
CREATININE: 1.03 mg/dL (ref 0.70–1.18)
Calcium: 8.7 mg/dL (ref 8.6–10.3)
GFR, EST NON AFRICAN AMERICAN: 69 mL/min (ref >=60)
GFR, Est African American: 79 mL/min (ref >=60)
Glucose, Bld: 223 mg/dL — ABNORMAL HIGH (ref 70–99)
POTASSIUM: 5.3 mmol/L (ref 3.5–5.3)
SODIUM: 133 mmol/L — AB (ref 135–146)

## 2017-04-06 MED ORDER — APIXABAN 5 MG PO TABS: 5.0000 mg | ORAL_TABLET | Freq: Two times a day (BID) | ORAL | 0 refills | Status: DC

## 2017-04-11 ENCOUNTER — Ambulatory Visit: Payer: Medicare Other | Admitting: Physician Assistant

## 2017-04-11 ENCOUNTER — Other Ambulatory Visit: Payer: Self-pay

## 2017-04-11 DIAGNOSIS — R600 Localized edema: Secondary | ICD-10-CM

## 2017-04-11 DIAGNOSIS — I5032 Chronic diastolic (congestive) heart failure: Secondary | ICD-10-CM

## 2017-04-11 DIAGNOSIS — Z09 Encounter for follow-up examination after completed treatment for conditions other than malignant neoplasm: Secondary | ICD-10-CM

## 2017-04-11 MED ORDER — FUROSEMIDE 20 MG PO TABS: ORAL_TABLET | ORAL | 3 refills | Status: DC

## 2017-04-11 MED ORDER — POTASSIUM CHLORIDE CRYS ER 20 MEQ PO TBCR: EXTENDED_RELEASE_TABLET | ORAL | 0 refills | Status: DC

## 2017-04-12 ENCOUNTER — Ambulatory Visit (INDEPENDENT_AMBULATORY_CARE_PROVIDER_SITE_OTHER): Admitting: Physician Assistant

## 2017-04-12 ENCOUNTER — Ambulatory Visit: Payer: Self-pay | Admitting: Physician Assistant

## 2017-04-12 DIAGNOSIS — Z79899 Other long term (current) drug therapy: Secondary | ICD-10-CM

## 2017-04-12 NOTE — Progress Notes (Signed)
Patient ID: Edward [name] MRN: [id number], DOB: Jul 30, 1935, 82 y.o. Date of Encounter: 04/12/2017, 12:55 PM    Chief Complaint: No chief complaint on file.    HPI: 82 y.o. year old male   When  I received recent lab results I planned to adjust dose of furosemide and potassium. I was concerned that patient would get confused about how to dose medications so recommended them come in for [place] to review this with them if needed. Patient and wife present for this today. See the result note attached to last bmet. I have clearly written out instructions for how to dose the furosemide and K-Lor con on Monday Wednesday Fridays and how to dose it on the other days of the week. Note that his med bottles use the terms furosemide and K-LorCon so this is what I have written on the paper and I have reviewed it with his wife who says she is the one that sets out his medicines for him. She voiced understanding.  He is to return to have me bmet rechecked the week if July 30. Order has been placed for this and future lab ordered.  Home Meds:   Outpatient Medications Prior to Visit  Medication Sig Dispense Refill  . albuterol (PROVENTIL) (2.5 MG/3ML) 0.083% nebulizer solution INHALE 1 VIAL VIA NEBULIZER EVERY 6 HOURS AS NEEDED FOR WHEEZING OR SHORTNESS OF BREATH 360 mL 5  . apixaban (ELIQUIS) 5 MG TABS tablet Take 1 tablet (5 mg total) by mouth 2 (two) times daily. 60 tablet 0  . baclofen (LIORESAL) 10 MG tablet Take 10 mg by mouth 3 (three) times daily.    . budesonide (PULMICORT) 0.5 MG/2ML nebulizer solution Take 2 mLs (0.5 mg total) by nebulization 2 (two) times daily. 120 mL 5  . diltiazem (CARDIZEM CD) 240 MG 24 hr capsule Take 1 capsule (240 mg total) by mouth daily. 30 capsule 0  . furosemide (LASIX) 20 MG tablet Take 2 each morning. Take 2 in early afternoon on Mon.Wed,Fri Take 2 tablets in the morning on Tue.Thur, Sat,Sun 60 tablet 3  . insulin NPH Human (NOVOLIN N) 100 UNIT/ML injection 20 units  with breakfast daily and 20 units with supper daily 10 mL 1  . ipratropium (ATROVENT) 0.02 % nebulizer solution Take 2.5 mLs (0.5 mg total) by nebulization 4 (four) times daily. 25 mL 12  . losartan (COZAAR) 100 MG tablet TAKE ONE TABLET BY MOUTH DAILY. 30 tablet 0  . metFORMIN (GLUCOPHAGE) 1000 MG tablet TAKE ONE TABLET BY MOUTH TWO TIMES DAILY. 60 tablet 0  . metoprolol tartrate (LOPRESSOR) 25 MG tablet Take 1 tablet (25 mg total) by mouth 2 (two) times daily. 60 tablet 1  . mirtazapine (REMERON) 30 MG tablet Take 1 tablet (30 mg total) by mouth at bedtime. 30 tablet 3  . omeprazole (PRILOSEC) 20 MG capsule Take 1 capsule (20 mg total) by mouth daily. 30 capsule 3  . ONETOUCH DELICA LANCETS 84Y MISC USE TO CHECK BLOOD SUGAR TWICE DAILY AS DIRECTED 100 each 2  . potassium chloride SA (K-DUR,KLOR-CON) 20 MEQ tablet Take one in morning,take one in early afternoon on Mon.Wed,Fri, Take 1 tablet in the morning on Tue,Thur, Sat,Sun 30 tablet 0  . pravastatin (PRAVACHOL) 80 MG tablet TAKE ONE TABLET BY MOUTH DAILY AT BEDTIME. 30 tablet 0  . predniSONE (DELTASONE) 10 MG tablet Take 5 tablets (50 mg total) by mouth daily with breakfast. And decrease by one tablet daily 15 tablet 0   No  facility-administered medications prior to visit.     Allergies:  Allergies  Allergen Reactions  . Ace Inhibitors Other (See Comments)    Hyperkalemia--07/23/2013:patient states not familiar with the following allergy     No exam performed today.  20 minutes spent with patient and wife in direct patient counseling. ASSESSMENT AND PLAN:  82 y.o. year old male with  1. Medication dose changed See the result note attached to recent bmet for dose adjustment on Lasix and  potassium. Recheck bmet week of July 30. - BASIC METABOLIC PANEL WITH GFR; Future   Signed, Olean Ree [place], [place], [facility] 04/12/2017 12:55 PM

## 2017-04-13 ENCOUNTER — Other Ambulatory Visit: Payer: Self-pay

## 2017-04-13 MED ORDER — DILTIAZEM HCL ER COATED BEADS 240 MG PO CP24: 240.0000 mg | ORAL_CAPSULE | Freq: Every day | ORAL | 3 refills | Status: DC

## 2017-04-13 NOTE — Telephone Encounter (Signed)
Refill appropriate 

## 2017-04-16 ENCOUNTER — Other Ambulatory Visit: Payer: Self-pay | Admitting: Physician Assistant

## 2017-04-17 NOTE — Telephone Encounter (Signed)
Refill appropriate 

## 2017-04-21 ENCOUNTER — Other Ambulatory Visit: Payer: Self-pay | Admitting: Physician Assistant

## 2017-04-21 DIAGNOSIS — E1165 Type 2 diabetes mellitus with hyperglycemia: Principal | ICD-10-CM

## 2017-04-21 DIAGNOSIS — IMO0001 Reserved for inherently not codable concepts without codable children: Secondary | ICD-10-CM

## 2017-04-30 ENCOUNTER — Other Ambulatory Visit: Payer: Medicare Other

## 2017-05-07 ENCOUNTER — Encounter: Payer: Self-pay | Admitting: Physician Assistant

## 2017-05-07 ENCOUNTER — Other Ambulatory Visit: Payer: Self-pay

## 2017-05-07 ENCOUNTER — Ambulatory Visit (INDEPENDENT_AMBULATORY_CARE_PROVIDER_SITE_OTHER): Admitting: Physician Assistant

## 2017-05-07 VITALS — BP 132/74 | HR 86 | Temp 97.8°F | Resp 16 | Ht 66.0 in | Wt 174.6 lb

## 2017-05-07 DIAGNOSIS — J439 Emphysema, unspecified: Secondary | ICD-10-CM | POA: Diagnosis not present

## 2017-05-07 DIAGNOSIS — N183 Chronic kidney disease, stage 3 unspecified: Secondary | ICD-10-CM

## 2017-05-07 DIAGNOSIS — I5032 Chronic diastolic (congestive) heart failure: Secondary | ICD-10-CM

## 2017-05-07 DIAGNOSIS — E1165 Type 2 diabetes mellitus with hyperglycemia: Secondary | ICD-10-CM | POA: Diagnosis not present

## 2017-05-07 DIAGNOSIS — I10 Essential (primary) hypertension: Secondary | ICD-10-CM

## 2017-05-07 DIAGNOSIS — Z794 Long term (current) use of insulin: Secondary | ICD-10-CM | POA: Diagnosis not present

## 2017-05-07 DIAGNOSIS — IMO0001 Reserved for inherently not codable concepts without codable children: Secondary | ICD-10-CM

## 2017-05-07 LAB — BASIC METABOLIC PANEL WITH GFR
BUN: 35 mg/dL — AB (ref 7–25)
CO2: 29 mmol/L (ref 20–32)
CREATININE: 0.98 mg/dL (ref 0.70–1.18)
Calcium: 9.1 mg/dL (ref 8.6–10.3)
Chloride: 98 mmol/L (ref 98–110)
GFR, EST AFRICAN AMERICAN: 84 mL/min (ref >=60)
GFR, Est Non African American: 73 mL/min (ref >=60)
GLUCOSE: 146 mg/dL — AB (ref 70–99)
Potassium: 4.4 mmol/L (ref 3.5–5.3)
Sodium: 135 mmol/L (ref 135–146)

## 2017-05-07 MED ORDER — INSULIN NPH (HUMAN) (ISOPHANE) 100 UNIT/ML ~~LOC~~ SUSP: SUBCUTANEOUS | 1 refills | Status: DC

## 2017-05-07 MED ORDER — PREDNISONE 20 MG PO TABS: 20.0000 mg | ORAL_TABLET | Freq: Every day | ORAL | 0 refills | Status: DC

## 2017-05-07 MED ORDER — METOPROLOL TARTRATE 25 MG PO TABS: 25.0000 mg | ORAL_TABLET | Freq: Two times a day (BID) | ORAL | 1 refills | Status: DC

## 2017-05-07 NOTE — Telephone Encounter (Signed)
Refill appropriate 

## 2017-05-07 NOTE — Progress Notes (Signed)
Patient ID: Edward [name] MRN: [id number], DOB: 06-02-1935, 82 y.o. Date of Encounter: @DATE @  Chief Complaint:  No chief complaint on file.   HPI: 82 y.o. year old male  presents for above.    03/21/2017: Usually he is always here alone by himself for his visits. Today his wife and his son are here with him.  I have reviewed his hospital discharge summaries. I reviewed the one from 02/25/17 through 03/04/17. I've also reviewed the one from 03/04/17 through 03/10/17.  He was admitted 02/25/17 with sepsis from pulmonary source presumed secondary to left-sided aspiration pneumonia in the setting of intractable emesis. On 02/25/17 after admission he became very tachycardic and had A. fib with RVR which responded well to IV Cardizem. Subsequently converted to sinus rhythm. He was seen by SLP and underwent barium swallow which showed no signs of aspiration but coughing with regular solid foods. Was started on dysphagia 3 diet. He was being very angry on day of discharge and felt as though he had been in the hospital too long. Attempts to discuss medications that were new were made with patient refused to engage in conversation. Call was placed to patient's family and his son stated that he would be present at time of discharge to discuss what medications patient needed. Son voiced understanding that the medications prescribed at discharge.  Review of the summary from 03/04/17-03/10/17 states that he was recently in the hospital for pneumonia and insistent on being discharged home on 6/2. On 6/3 he required readmission for shortness of breath and confusion. He was found by EMS at home without any oxygen on and with oxygen saturations in the 70s. Was readmitted to the hospital for treatment of pneumonia, acute on chronic respiratory failure requiring BiPAP and A. fib with RVR.  He was discharged home with DO NOT RESUSCITATE and also was home with hospice and had palliative medicine follow-up during the  hospitalization.    Today patient reports that his feet have been swelling since he has been home from the hospital. Today he reports that he needs something for sleep.  Today they do bring in his medicine bottles in a brown paper bag. Specifically reviewed that his bag does include current bottle with pills in the bottle of Lasix 20--2 every morning and potassium 20 mEq every morning. Also specifically reviewed that he currently is on no medications for insomnia.  He has no other specific concerns to address today.  They report that someone with hospice/palliative care has been coming to the house on average about every other day. Says that last week they had 3 or 4 home visits and they had one yesterday.  He also brings in paper where he has been documenting blood sugar readings. June 19 he has 2 readings of 136 and 214. June 20 one reading of 130. These are the only readings that have a date documented to correlate.    - CBC with Differential/Platelet - BASIC METABOLIC PANEL WITH GFR - Brain natriuretic peptide - potassium chloride SA (K-DUR,KLOR-CON) 20 MEQ tablet; Take one in morning. Take one in early afternoon.  Dispense: 60 tablet; Refill: 2 - furosemide (LASIX) 20 MG tablet; Take 2 each morning. Take 2 in early afternoon.  Dispense: 60 tablet; Refill: 3   Insomnia, unspecified type - mirtazapine (REMERON) 30 MG tablet; Take 1 tablet (30 mg total) by mouth at bedtime.  Dispense: 30 tablet; Refill: 3  Obtain labs. Increase Lasix from 2 in the morning only to  taking 2 twice a day. Increase potassium from taking 1 in the morning only to taking 1 twice a day Add Remeron for insomnia. Schedule follow-up office visit with me in one week to re-evaluate edema and insomnia.    03/28/2017: Wife and son accompany him for office visit again today.  Labs drawn at last office visit on 03/21/17 came back good. BNP was only slightly elevated at 121.5. CBC showed white count back down to  Edward. Anemia was stable. Bmet was good. Kidney function back to Edward with BUN 21 creatinine 1.02. GFR Edward. At that time told him to increase protein in his diet to help with the lower extremity edema.  Today he does bring in fasting blood sugar readings--- the following are the recent readings from the past week-----he asked why his blood sugars are up and down/fluctuating so much-----wife states that he has been eating a lot of at sandwiches. Says that he likes to put it on pieces of white bread to make a sandwich. Says he also has been eating some boiled eggs. I then discussed other things he may be eating that would be causing sugar to go up at times but he says he has been eating about the same on each day and doesn't think he has been [name] more carbohydrates on nights prior to fasting blood sugars being high. Wife states that they do not eat pasta as far spaghetti or lasagna or pizza with pizza crust. Asked patient if he is snacking and he says no he doesn't have anything to snack on. Sounds like he is mostly eating the exam which is. 311, 358, 375, 124, 230, 259, 195, 133  He states that his swelling is improved with taking the Lasix and potassium as directed at last office visit.  He states that the insomnia medicine is working very well. He is getting good sleep now and the medicine is causing no side effects. Wife adds "he must be sleeping good he isn't waking [place] up anymore ".  He has no other concerns to address today.  They report that a nurse came to the house yesterday and checked on him and that another nurses was to be coming again sometime this week.    04/05/2017: His wife accompanies him for a visit today. His son is not here to today. Patient states that he has no swelling-- that has stayed resolved. He has no specific concerns today. He does bring in blood sugar log sheet and has the following blood sugars documented : 138, 163, 125, 162, 113, 123, 124.  I asked if they  were still having nurses come to the house frequently. He and his wife both state that they have not been having many home visits recently. Says that there has been one man from hospice that has still come but that is all.   AT THAT OV: Checked BMET and: Will have him continue current dosing of Lasix and potassium. Will check Bmet to monitor.  If this lab comes back stable and if he continues to feel stable then will plan for him to wait for follow-up office visit in one month.  If today's lab shows any issues then will address accordingly. As well I have told both patient and his wife that if he has any issues arise they can always call and schedule to come in sooner. Otherwise if things are stable can wait till follow-up one month.   BMET on 04/05/2017: Potassium was 5.3. BUN was elevated  at 33. Creatinine was Edward at 1.03. At that time noted that currently prior to that his Lasix was 2 in the morning, 2 in the afternoon and the potassium was 1 in the morning, 1 in the afternoon. Said that-- starting at that point ---for him to continue taking that dose on Mondays/ Wednesdays /Fridays but  on the other days of the week to change the dose to take Lasix 2 in the morning only (none later in the day). Take potassium 1 in the morning only (non-later in the day).  Continue that dose for upcoming weeks.  Patient came to the office and saw me on 04/12/17 for me to verbally review those instructions with him and his wife and also wrote down new dosing instructions on paper. His wife said that she was the one that does administer his medicines and help keep up with his medicines.    05/07/2017:  Today patient reports "swelling coming back some ". Also says "wheeze at night time too ". Later in the visit when I mentioned I would be doing some blood work he said "sugar will be high -- I have been eaten chicken and dumplins the past 2-3 days--- I like that chicken and dumplins" Verified that he has been  compliant with all pulmonary medicines and he says that he is using all his breathing medicines as directed. Also followed up to see if support caregivers are still coming to his house. "That man still comin to the house about one time every week" He has no other specific concerns today.       Past Medical History:  Diagnosis Date  . Allergy    Rhinitis  . Bronchitis   . Chronic respiratory failure ([place])   . Colon polyps   . COPD (chronic obstructive pulmonary disease) ([place])   . Diabetes mellitus   . Elevated lipids   . Hypercholesterolemia   . Hypertension   . On home O2    2L N/C   . PSA elevation   . Pulmonary fibrosis ([place])   . Vitamin D deficiency      Home Meds: Outpatient Medications Prior to Visit  Medication Sig Dispense Refill  . albuterol (PROVENTIL) (2.5 MG/3ML) 0.083% nebulizer solution INHALE 1 VIAL VIA NEBULIZER EVERY 6 HOURS AS NEEDED FOR WHEEZING OR SHORTNESS OF BREATH 360 mL 5  . apixaban (ELIQUIS) 5 MG TABS tablet Take 1 tablet (5 mg total) by mouth 2 (two) times daily. 60 tablet 0  . baclofen (LIORESAL) 10 MG tablet Take 10 mg by mouth 3 (three) times daily.    . budesonide (PULMICORT) 0.5 MG/2ML nebulizer solution Take 2 mLs (0.5 mg total) by nebulization 2 (two) times daily. 120 mL 5  . diltiazem (CARDIZEM CD) 240 MG 24 hr capsule Take 1 capsule (240 mg total) by mouth daily. 30 capsule 3  . furosemide (LASIX) 20 MG tablet Take 2 each morning. Take 2 in early afternoon on Mon.Wed,Fri Take 2 tablets in the morning on Tue.Thur, Sat,Sun 60 tablet 3  . insulin NPH Human (NOVOLIN N) 100 UNIT/ML injection 20 units with breakfast daily and 20 units with supper daily 10 mL 1  . ipratropium (ATROVENT) 0.02 % nebulizer solution Take 2.5 mLs (0.5 mg total) by nebulization 4 (four) times daily. 25 mL 12  . losartan (COZAAR) 100 MG tablet TAKE ONE TABLET BY MOUTH DAILY. 30 tablet 1  . metFORMIN (GLUCOPHAGE) 1000 MG tablet TAKE ONE TABLET BY MOUTH TWO TIMES DAILY. 60  tablet 1  .  metoprolol tartrate (LOPRESSOR) 25 MG tablet Take 1 tablet (25 mg total) by mouth 2 (two) times daily. 60 tablet 1  . mirtazapine (REMERON) 30 MG tablet Take 1 tablet (30 mg total) by mouth at bedtime. 30 tablet 3  . omeprazole (PRILOSEC) 20 MG capsule Take 1 capsule (20 mg total) by mouth daily. 30 capsule 3  . ONE TOUCH ULTRA TEST test strip USE TO CHECK BLOOD SUGAR EACH MORNING AND THEN 2 HOURS AFTER ANY MEAL 100 each 2  . ONETOUCH DELICA LANCETS 32D MISC USE TO CHECK BLOOD SUGAR TWICE DAILY AS DIRECTED 100 each 2  . potassium chloride SA (K-DUR,KLOR-CON) 20 MEQ tablet Take one in morning,take one in early afternoon on Mon.Wed,Fri, Take 1 tablet in the morning on Tue,Thur, Sat,Sun 30 tablet 0  . pravastatin (PRAVACHOL) 80 MG tablet TAKE ONE TABLET BY MOUTH DAILY AT BEDTIME. 30 tablet 1  . predniSONE (DELTASONE) 10 MG tablet Take 5 tablets (50 mg total) by mouth daily with breakfast. And decrease by one tablet daily 15 tablet 0   No facility-administered medications prior to visit.     Allergies:  Allergies  Allergen Reactions  . Ace Inhibitors Other (See Comments)    Hyperkalemia--07/23/2013:patient states not familiar with the following allergy    Social History   Social History  . Marital status: Married    Spouse name: N/A  . Number of children: N/A  . Years of education: N/A   Occupational History  . 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 Smoking status: Former Smoker    Packs/day: 1.50    Years: 60.00    Types: Cigarettes    Quit date: 12/31/2012  . Smokeless tobacco: Never Used  . Alcohol use No  . Drug use: No  . Sexual activity: Yes    Birth control/ protection: None   Other Topics Concern  . Not on file   Social History Narrative  . No narrative on file    Family History  Problem Relation Age of Onset  . Heart disease Mother   . CAD Other   . Diabetes Other      Review of Systems:  See HPI for pertinent ROS. All  other ROS negative.    Physical Exam: Blood pressure 132/74, pulse 86, temperature 97.8 F (36.6 C), temperature source Oral, resp. rate 16, height 5\' 6"  (1.676 m), weight 174 lb 9.6 oz (79.2 kg), SpO2 95 %., There is no height or weight on file to calculate BMI. General: WM. Appears in no acute distress. Neck: Supple. No thyromegaly. No lymphadenopathy. Lungs: Distant, decreased breath sounds throughout but no wheezes rhonchi or rales. Heart: RRR with S1 S2. No murmurs, rubs, or gallops. Musculoskeletal:  Strength and tone Edward for age. Extremities/Skin: Bilateral Feet and Lower Extremities---- 1+ pitting edema bilaterally--feet and pedal edema--not higher up legs Neuro: Alert and oriented X 3. Moves all extremities spontaneously. Gait is Edward. CNII-XII grossly in tact. Psych:  Responds to questions appropriately with a Edward affect.     ASSESSMENT AND PLAN:  82 y.o. year old male with    1. Essential hypertension Blood Pressure at goal. Check lab to monitor. - BASIC METABOLIC PANEL WITH GFR  2. Chronic diastolic heart failure ([place]) Had gotten lower extremity edema completely resolved at visit 04/05/17. However after that his creatinine was up some so I decrease Lasix dosing slightly---see lab result note from lab 04/05/17 Now having some mild edema since adjusted  Lasix dose Today I am not changing Lasix dose again. Rather I will get lab results from today first and then decide whether to adjust Lasix. Today's edema is not nearly as bad as it had been in the past and I am okay with this level of edema - BASIC METABOLIC PANEL WITH GFR  3. Pulmonary emphysema, unspecified emphysema type ([place]) His lungs actually sound pretty clear on exam and I really do not hear any wheezing. However when just talking to him in the exam room I can hear heavy breathing and he is reporting some wheezing at night. He reports that he is being compliant in using all pulmonary medicines as directed. Will  add prednisone 20 mg daily for 5 days. Told him to call me if his breathing worsens or does not return to baseline with this. - predniSONE (DELTASONE) 20 MG tablet; Take 1 tablet (20 mg total) by mouth daily with breakfast.  Dispense: 5 tablet; Refill: 0  4. Uncontrolled type 2 diabetes mellitus without complication, with long-term current use of insulin ([name]) Last A1c was performed at the hospital on 02/28/17. Therefore next A1c due 05/31/17. Therefore having him schedule follow-up visit with me for the first week of September and at that time we'll recheck A1c and returned to his Edward routine visits. Note that his last R OV note was on 01/10/17 Did review that his A1c was very high at last check on 02/28/17. However I also reviewed that at recent visit he reported good blood sugar readings. He has history of noncompliance and hopefully he is being more compliant since the time of that hospitalization since he has been having further care providers at home.  5. CKD (chronic kidney disease), stage III See note under #2 above. - BASIC METABOLIC PANEL WITH GFR    He is scheduling follow-up visit for the first week of September. He is to follow-up sooner if needed. Specifically follow-up if LE edema worsens or if breathing worsens.   Marin Olp [place], [place], [facility] 05/07/2017 7:49 AM

## 2017-05-11 ENCOUNTER — Other Ambulatory Visit: Payer: Self-pay | Admitting: Physician Assistant

## 2017-05-11 IMAGING — CT CT ORBITS W/ CM
3 series · 13 of 47 positions shown, 15 images · IV contrast (omnipaque)
Comparison: None.

CLINICAL DATA: Left eye soreness x1 week, redness/swelling

EXAM:
CT ORBITS WITH CONTRAST
TECHNIQUE: Multidetector CT imaging of the orbits was performed following the
bolus administration of intravenous contrast.
CONTRAST:  75mL OMNIPAQUE IOHEXOL 300 MG/ML  SOLN

[Series 2: max st axial · axial · 0.29mm/px · z∈[+66,+122]mm · 7 of 35 slices shown, 9 images]
[im 4/35  brain]
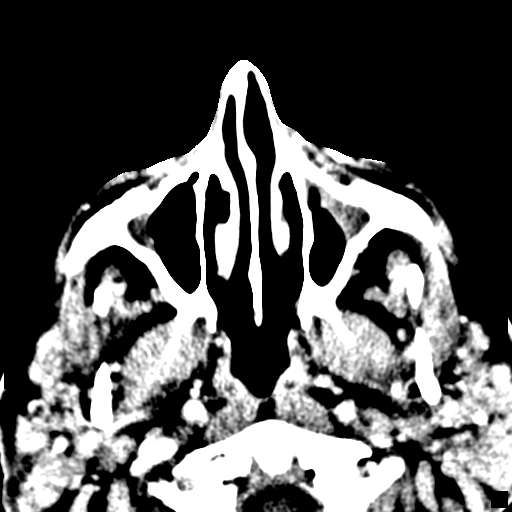
[im 4/35  bone]
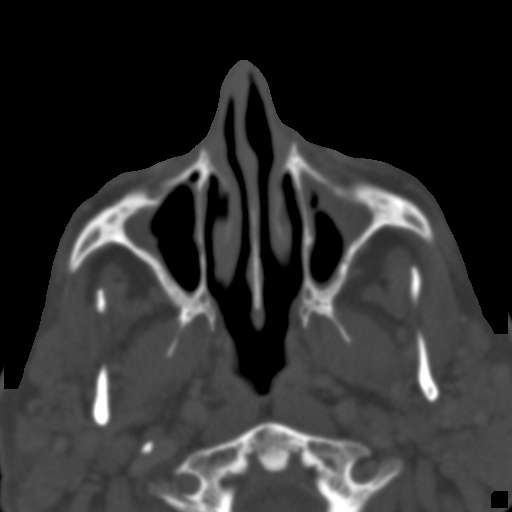
[im 9/35  bone]
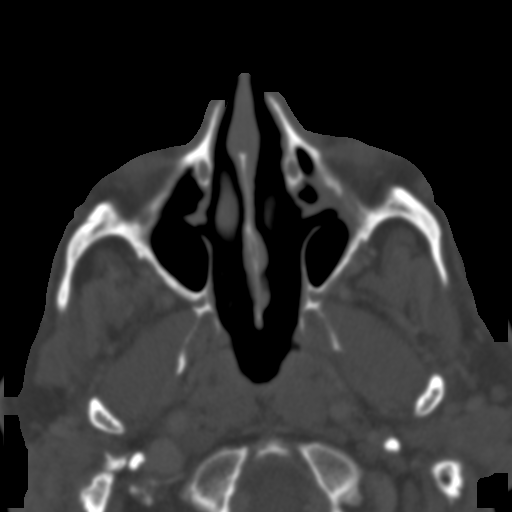
[im 13/35  bone]
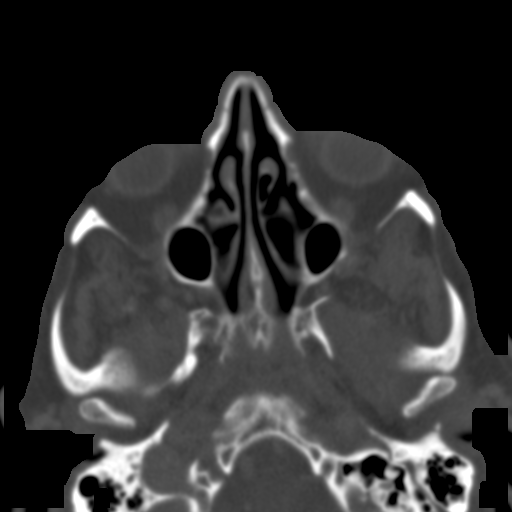
[im 18/35  bone]
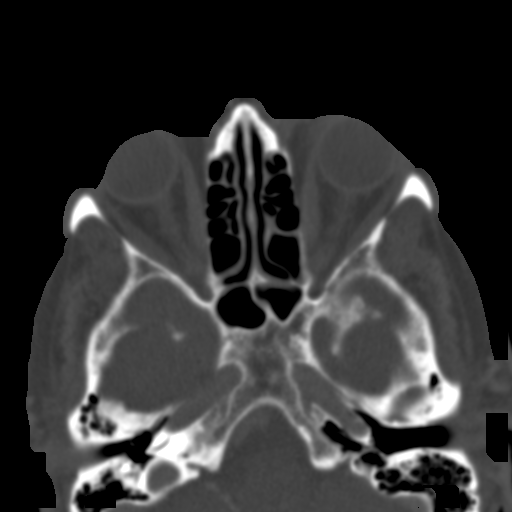
[im 23/35  brain]
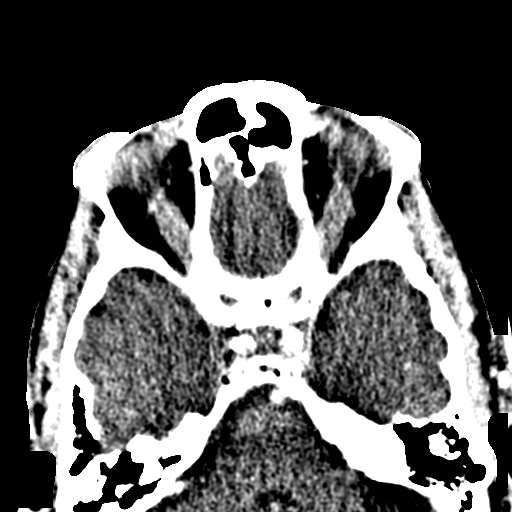
[im 23/35  bone]
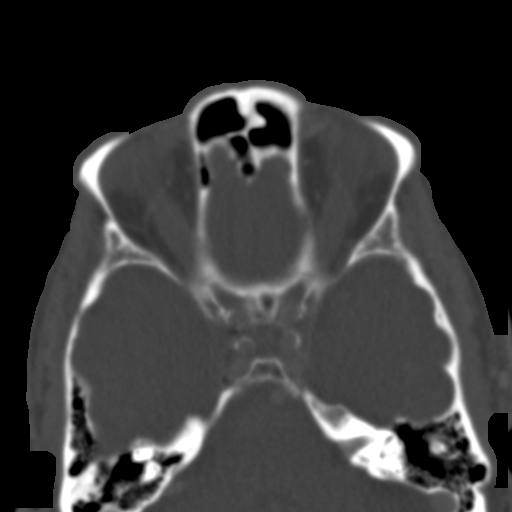
[im 27/35  bone]
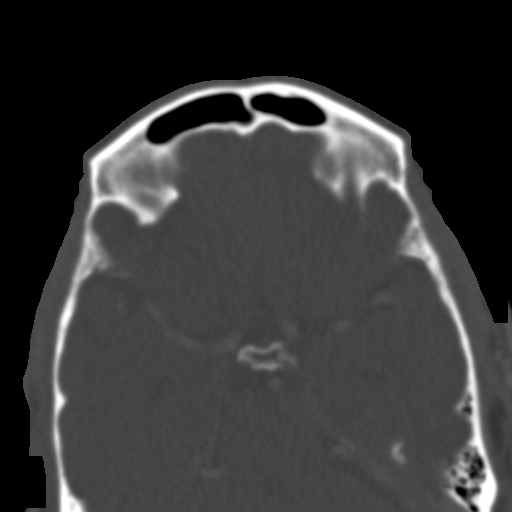
[im 32/35  bone]
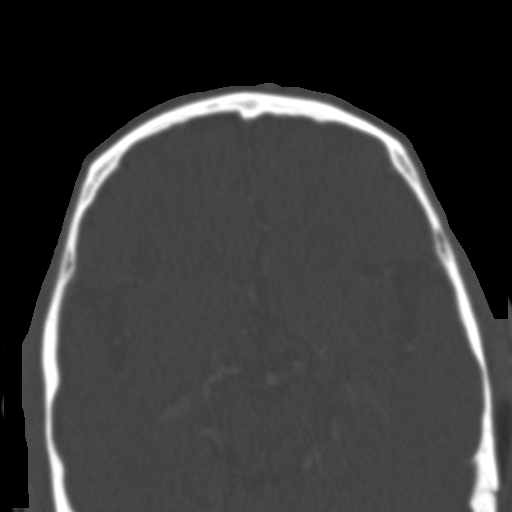

[Series 4: max st coro · coronal · 0.15mm/px · 3 of 54 slices shown]
[im 18/54  bone]
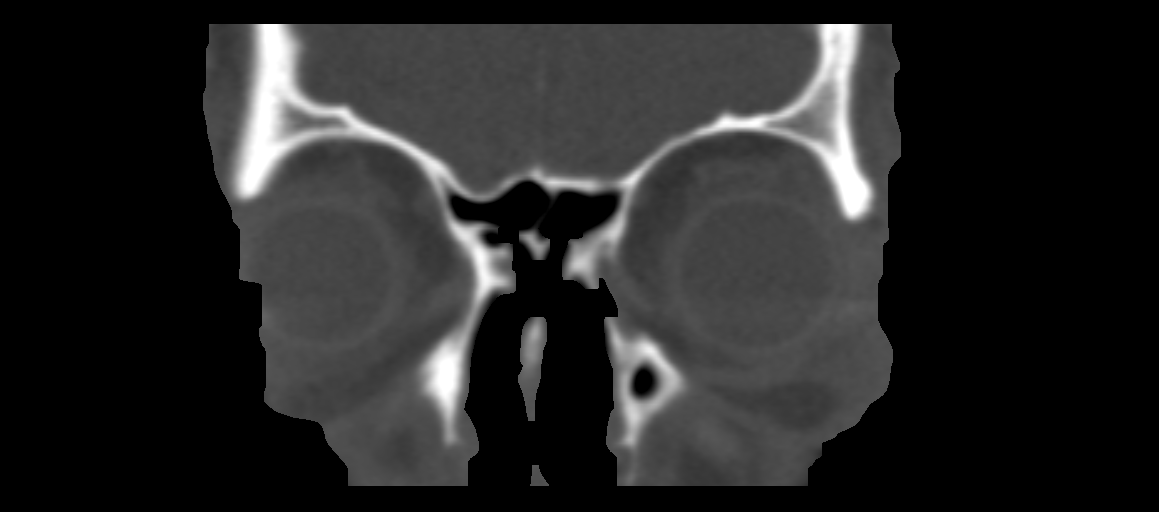
[im 24/54  bone]
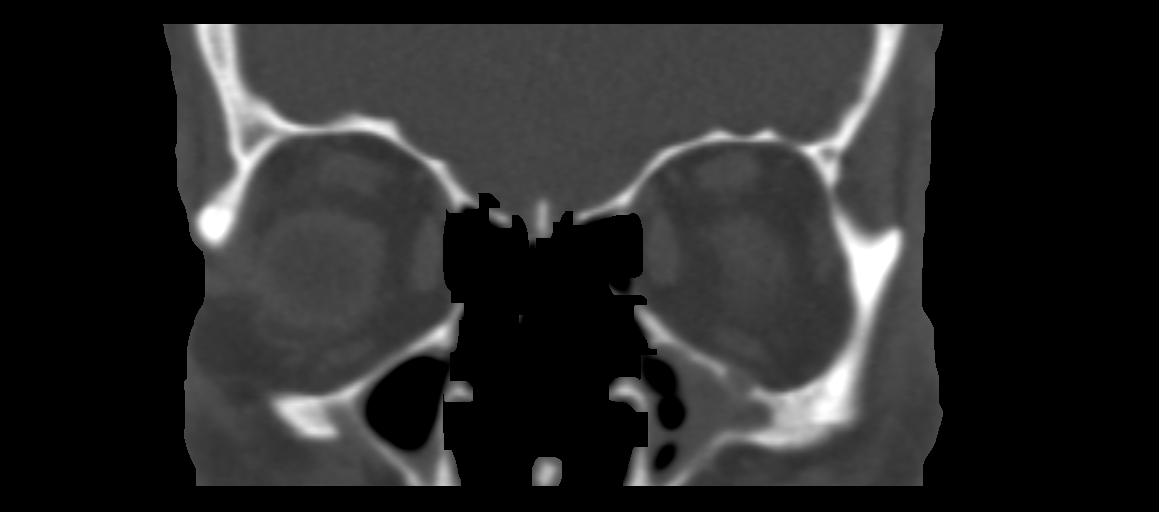
[im 30/54  bone]
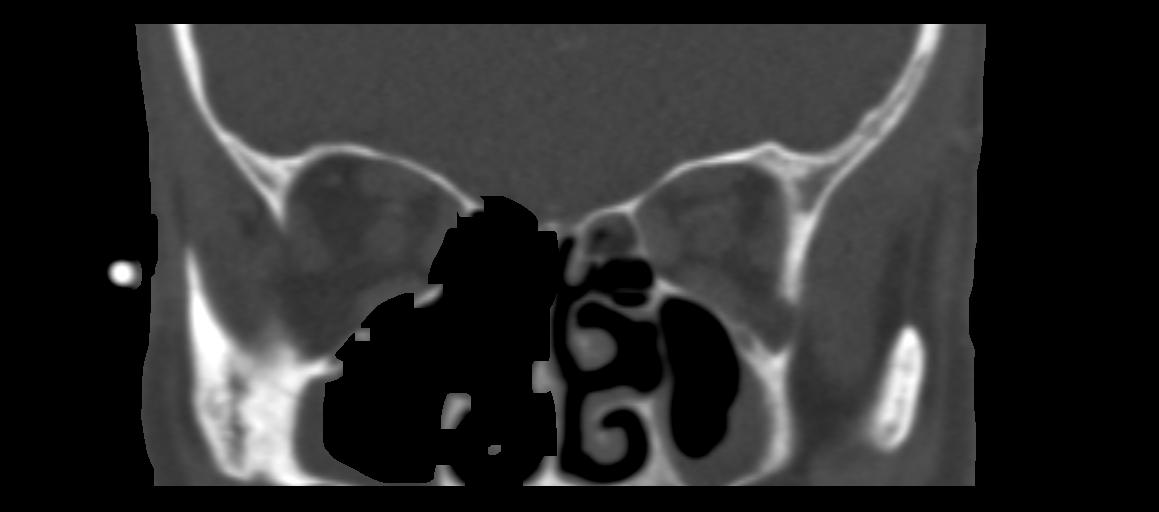

[Series 5: max st sag · sagittal · 0.15mm/px · 3 of 75 slices shown]
[im 25/75  bone]
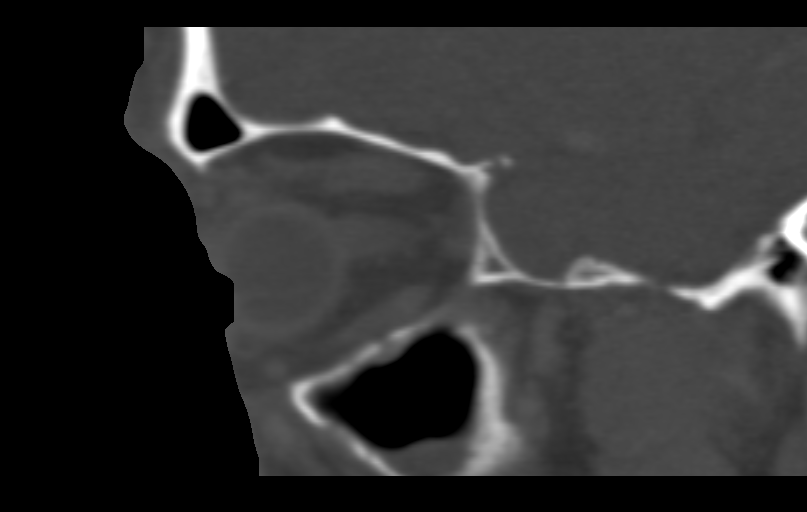
[im 38/75  bone]
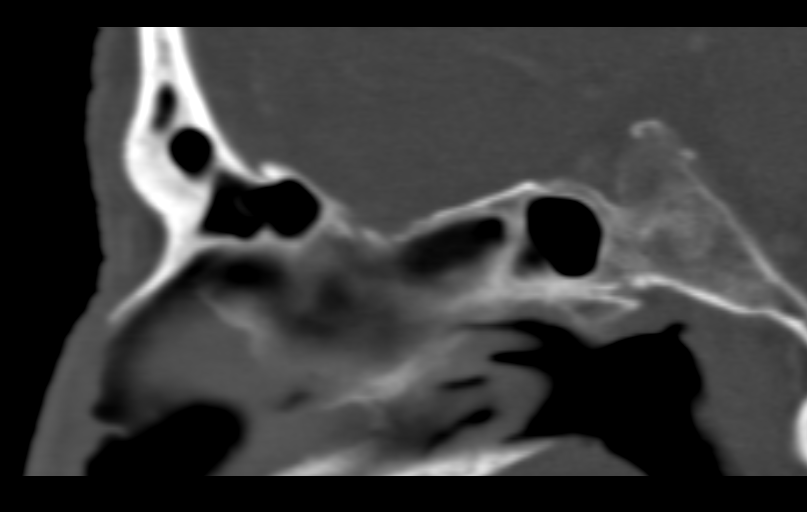
[im 50/75  bone]
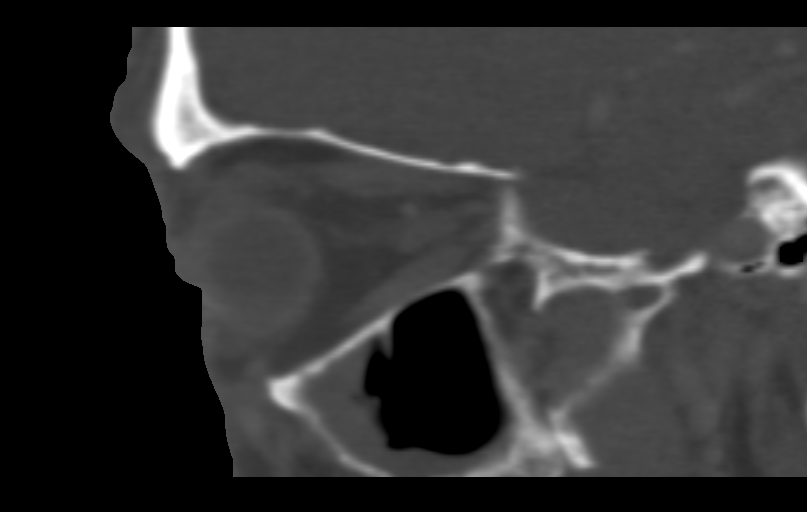

[13 of 47 positions shown; findings below may reference images not displayed]

FINDINGS: Preseptal soft tissue swelling overlying the left orbit (series 2/
image 20). Underlying globe and retroconal soft tissues are within
normal limits.

Right orbit is within normal limits.

Partial opacification/ mucosal thickening of the bilateral maxillary
sinuses. Visualized paranasal sinuses and mastoid air cells are
otherwise clear.

No evidence of maxillofacial fracture.
IMPRESSION: Preseptal cellulitis overlying the left orbit.

Underlying globe and retroconal soft tissues are within normal
limits.

## 2017-05-14 NOTE — Telephone Encounter (Signed)
Refill appropriate 

## 2017-05-16 ENCOUNTER — Ambulatory Visit: Admitting: Physician Assistant

## 2017-05-30 ENCOUNTER — Telehealth: Payer: Self-pay | Admitting: *Deleted

## 2017-05-30 NOTE — Telephone Encounter (Signed)
Received call from [place],  [facility] [phone]~ telephone.   Reports that patient will be discharged from Hospice services on 06/07/2017 as patient is no longer appropriate for Hospice care.   States that patient will require order for home O2.   Please advise.

## 2017-05-30 NOTE — Telephone Encounter (Signed)
Hospital note from 03/2017 documented oxygen saturation in the 70s on room air. Place order for oxygen 2 L nasal cannulae

## 2017-05-31 NOTE — Telephone Encounter (Signed)
Order faxed to [facility]

## 2017-06-05 NOTE — Telephone Encounter (Signed)
Ron called again about getting this patient switched to O2 with another provider - can you follow up on this to make sure [facility] did receive the order.

## 2017-06-06 ENCOUNTER — Other Ambulatory Visit: Payer: Self-pay | Admitting: Physician Assistant

## 2017-06-06 ENCOUNTER — Ambulatory Visit (INDEPENDENT_AMBULATORY_CARE_PROVIDER_SITE_OTHER): Admitting: Physician Assistant

## 2017-06-06 VITALS — BP 140/64 | HR 94 | Temp 98.2°F | Resp 20 | Ht 65.0 in | Wt 189.0 lb

## 2017-06-06 DIAGNOSIS — I5032 Chronic diastolic (congestive) heart failure: Secondary | ICD-10-CM

## 2017-06-06 DIAGNOSIS — I10 Essential (primary) hypertension: Secondary | ICD-10-CM | POA: Diagnosis not present

## 2017-06-06 DIAGNOSIS — Z09 Encounter for follow-up examination after completed treatment for conditions other than malignant neoplasm: Secondary | ICD-10-CM | POA: Diagnosis not present

## 2017-06-06 DIAGNOSIS — N183 Chronic kidney disease, stage 3 unspecified: Secondary | ICD-10-CM

## 2017-06-06 DIAGNOSIS — R600 Localized edema: Secondary | ICD-10-CM

## 2017-06-06 DIAGNOSIS — E785 Hyperlipidemia, unspecified: Secondary | ICD-10-CM | POA: Diagnosis not present

## 2017-06-06 DIAGNOSIS — J8410 Pulmonary fibrosis, unspecified: Secondary | ICD-10-CM

## 2017-06-06 DIAGNOSIS — Z794 Long term (current) use of insulin: Secondary | ICD-10-CM

## 2017-06-06 DIAGNOSIS — E1165 Type 2 diabetes mellitus with hyperglycemia: Secondary | ICD-10-CM | POA: Diagnosis not present

## 2017-06-06 MED ORDER — FUROSEMIDE 20 MG PO TABS: ORAL_TABLET | ORAL | 3 refills | Status: DC

## 2017-06-06 MED ORDER — POTASSIUM CHLORIDE CRYS ER 20 MEQ PO TBCR: EXTENDED_RELEASE_TABLET | ORAL | 3 refills | Status: DC

## 2017-06-06 NOTE — Progress Notes (Signed)
Patient ID: Edward [name] MRN: [id number], DOB: [birth year], 82 y.o. Date of Encounter: @DATE @  Chief Complaint:  Chief Complaint  Patient presents with  . Follow-up    HPI: 82 y.o. year old male  presents for routine followup office visit.    At office visit with me 10/30/13 at which time we stopped multiple oral medicines and started Lantus. On 11/04/13 he had an ER visit with COPD. On 11/06/13 he had another ER visit with COPD. 11/10/2013 he had office visit with me to followup with COPD. 11/15/13 he was hospitalized with COPD. 12/01/13 have followup office visit with me regarding his COPD. 12/08/13 he had another followup office visit with me.  In the past I had placed him on Spiriva and Symbicort for his COPD. Repeatedly, he was noncompliant with these secondary to finances. At office visits I gave him samples repeatedly.  He is currently on budesonide Pulmicort nebulizer. Also currently on tiotropium bromide--Stiolto Respimat. Also nasal cannula oxygen. Also albuterol nebulizer.  Also in the past we had [facility] evaluate and visit him but then he was not compliant with following up with them and letting them help him.  We recently did contact them again and I do see that they have entered recent notes starting on 01/25/15 up through 02/09/15.  At visit 02/25/15 he states that he is "all choked up" says that he has phlegm in his chest and throat. In using his nebulizer a lot for 5 times a day and at night. Says that he has oxygen at home but he just uses that one or 2 times per day as needed and sometimes at night.  At visit 02/25/15 he did bring in blood sugar log sheet. He does have documented a fasting reading for every single morning. No other readings at any other times of day. For the month of May all of his readings are ranging 121-200. He only has 3 readings above 200. May 12 was 230. May 14 was 206. May 26-220 For the month of April fasting readings all range from 109 to 215.  However most of these readings are around 130. For the month of March, fasting readings range mostly from 143 to around 170.   At [place] 05/27/2015-- He reports "I guess you saw I was in the hospital"---05/09/15--Preseptal Cellulitis.  -----I asked today if he had f/u with Ophthalmolgist and he says he did not---b/c that's when his wife fell. Says his wife has been in the hospital for 2 weeks. Says she is now in [place] at [employer]. Says "all that running back and forth with her has run me ragged"  I see notes in Epic from"[facility]"---I asked pt if any nurses/staff have come to his house--and he says "no" and acts like he has seen/heard nothing of this.-- I then reviewed further---[facility] Note 03/22/2015---documents that pt does not want further f/u from them--"does not see the need" He also brings in BS readings.  I reviewed my last set of labs---that result note said to start documenting 2 hour post prandial BS readings.  However, readings today are all fasting but are documented for each/every day.  For July: 130s, 160s, also 200, 253, 220, 268 For August: 140-150 mostly, but also 170, 211, 212, 181. One reading of 278---but only one this high.  At [place] 08/30/2015: Reviewed that last A1C 05/27/15 was >14.  He says that at that time he did increase Levemir from 20 units QHS to 25 units QHS.  Brings  BS log: Only has Fasting readings: FOr every morning : Some 131, 121, 134, 111,  Most are 150-160 range. Highest: 200 and 284 but these are the only 2 high readings like this.   Says he has cough at night that keeps him awake--says some nights not so bad, but some nights, cough when lay down. Uses Albuterol multiple times in the night on those nights.  Says he is using all Pulm meds as directed and that they have not run out.   His last Routine OV was 08/30/2015, but after that, he had f/u OVs to f/u glucose and adjust Insulin.  At visit 08/30/15 A1c was 14 that he was told to increase Levemir from  25-30 units in follow-up 2 weeks. He had visit 09/15/15--- that time he had increased and was at Levemir 30 units. At that visit had him increase further to 35 units. He then had follow-up visit 09/20/15--at time he reported that he was up to 35 units. At that visit increased further to 40 units.  12/01/2015: States that his insulin is at 40 units. He brings in blood sugar log sheet with fasting readings daily. These readings include: 278, 230, 197, 269, 218, 287, 241, 259, 245, 221, 193, 140, 197, 191, 190, 233, 323. He brought in no readings from any other time of day. These were all fasting. Also brings me a copy of print out that is by [place] that lists all of his medications and information including price. He tells me that he recently had to stop the [employer] and the albuterol because of cost. However the cost for the albuterol says just $3.85  and the [employer] says $8.10 .  He continues to stay off of his cigarettes. Says he hasn't smoked in over 2 years.  Reports that he is taking blood pressure medication as directed with no headedness. Reports that he is taking his pravastatin with no myalgias or other adverse effects.   At [place] 12/01/15-- Told him to increase Insulin to 45 units and return in 1 week for f/u OV--to bring BS meter and log sheet to that appt  At [place] 12/08/2015: He brings his BS meter.  He brings BS log sheet.  I reviewed the numbers in the meter to make sure that these were accurate and an documenting the readings from the meter: All of these are fasting readings : 3/8-----188 3/7---------- 187 3/6-------------- 305 3/5---------- 205 3/4---------- 269 3/3--------- 227 3/2--------- 277 3/1--------- 332  Did look at his blood sugar log sheet. He has different numbers written on there. Asked if his wife is diabetic or if anyone else is using his meter and he says no.  I really think that he is illiterate.  For safety purposes, need to go by  readings on his meter, not on his BS log sheet.   He promises that he is taking insulin daily and is currently using 45 units-----he tells me this dose on his own-- and this is correct-- this is the amount that I told him at last visit  At this time, will have him increase to 55 units and f/u in 1 week with fasting BS readings---ON HIS METER---NOT WRITTEN ON LOG SHEET-------------  AT OV 12/15/2015: He reports that he is now doing insulin 55 units. He did bring his blood sugar meter with him and this is what I have reviewed. Early morning readings: 12/08/15------------------ 188 12/09/15------------------ 207 12/10/15---------------- 158 12/11/15----------------- 119 12/12/15------------------ 114 12/13/15------------------ 205 12/14/2015------------- 185 12/15/15------------------  267.  All of these fasting readings are performed around 3 AM. He states that he always wakes up around 3 AM. Says that he then eats oatmeal or sometimes a boiled egg. He does not eat any lunch. Through the day he just has water or milk. Yesterday around 4 PM he ate pinto beans. Says that later after that he had some milk and a few Nilla Wafers. Says that this is pretty average/typical of his usual daily intake. I asked if part of the reason that he eats a little is because of finances and because this is all he has to eat but he says that that is not it and says that he just isn't hungry for anything more. Asked if he snacks throughout the day and he says that he does not. Asked if he usually eats something like Nilla wafers in the evening--and he says not all the time.   03/23/2016: Says was able to get med for constipation (Amitiza)--says it is working well ---(Had OV with me regarding this 03/16/2016) Says he has "heartburn"---asks if I cna give him some medicine to use for heartburn. Brings BS log and has meter also Is not filling in log correctly again at this time--CAN NOT GO BYY LOG--MUST LOOK AT  METER Readings from meter: All of these are taken at ~ 2: 00 a.m. 6/22--- 190 6/19---  302 6/18--  285 6/17--  227 6/16--  347 6/14--  397 6/10---  263 6/8--  316  Iasked how much Insulin he is giving--says " 55"  Which is correct. Says he gives this ~ 6 pm then watches TV then goes to bed ~ 8 pm. At that visit I increased his insulin to 65 units daily at bedtime and told him to return in about one week. Bring blood sugar meter with him to that appointment.  03/30/2016:  Asked how much insulin he is giving and he says 65 units which is correct. Also he brings in his meter and also his sheet where he has been writing down blood sugars. The blood sugars that he has written down in the column that he says are the knee readings since last visit do correspond to the readings over the past week on his meter. 6/29--------145 6/28------182 6/27------239 6/26----------178 6/25-------109 6/24---------190 6/23---------171  07/06/2016: He reports that he has used no insulin for about one month now secondary to cost.----What he is reporting is consistent with "doughnut hole " States that he has been using his breathing medications so far but soon will not be able to afford the refills on these either secondary to hitting the doughnut hole. During his visit today when just speaking with him I can tell that he sounds like he has some wheezing and increased congestion. He said this has been a little worse just the past few days. No other complaints or concerns today. He does bring in some blood sugar log readings but they have been a little higher recently as he has been out of his insulin. I am not even documenting them since he is not even using his insulin. Says that his blood sugar monitor seems like some strip has gotten stuck down in the near and he cannot use his monitor. We have given him a new sample box today. This includes a new meter and supplies.    10/11/2016: Today he reports that he  had a sore throat that just started yesterday. Says that it was worse last night but is better now. Later  adds that he isn't sure if his throat is just sore from coughing.  Says that he has had no money for insulin so he has not been on any insulin-- so his blood sugars are running really high.  Says "you get me those medicines for breathing and my breathing gets better but then I go off the medicine and my breathing gets bad again--- can I just stay on those medicines ?"----asked him what medicines he is using for breathing medicines right now. He pulls out a [employer] and says that that is the only type of inhaler or nebulizer/only medicine for COPD that he is using right now--has not been able to afford other pulmonary meds.  No other specific complaints or concerns today.   01/10/2017: A nurse was at his house doing a home visit on Monday 01/08/17. Today patient brings in form that she completed during that visit. Also,at the time of that home visit -- while she was at his home -- she called here and actually spoke with her directly. She called to report that hemoglobin A1c was greater than 13 and blood sugar was 571. Had patient come and get sample of long-acting insulin so that he could take this. When that nurse called she informed me that patient had been taking no insulin secondary to cost. He was unable to afford insulin so had been taking none. Today he informs me that he has been able to buy no insulin at all. The only insulin he uses is samples that he is given from this office. It is the same with the COPD meds. He uses no type of breathing medicine except for the samples I provide him. Otherwise he has no other specific complaints or concerns today. He does take the other medications that are not so expensive. Takinglood pressure medicines as directed. Having no lightheadedness. Taking cholesterol medicine as directed. No myalgias or other adverse effects. He is taking the metformin as this  is not expensive but is unable to afford the insulin.  HE SUBSEQUENTLY HAD HOSPITALIZATIONS: 02/25/2017 - 03/04/2017  AND 03/04/2017 - 03/10/2017   03/21/2017: Usually he is always here alone by himself for his visits. Today his wife and his son are here with him.  I have reviewed his hospital discharge summaries. I reviewed the one from 02/25/17 through 03/04/17. I've also reviewed the one from 03/04/17 through 03/10/17.  He was admitted 02/25/17 with sepsis from pulmonary source presumed secondary to left-sided aspiration pneumonia in the setting of intractable emesis. On 02/25/17 after admission he became very tachycardic and had A. fib with RVR which responded well to IV Cardizem. Subsequently converted to sinus rhythm. He was seen by SLP and underwent barium swallow which showed no signs of aspiration but coughing with regular solid foods. Was started on dysphagia 3 diet. He was being very angry on day of discharge and felt as though he had been in the hospital too long. Attempts to discuss medications that were new were made with patient refused to engage in conversation. Call was placed to patient's family and his son stated that he would be present at time of discharge to discuss what medications patient needed. Son voiced understanding that the medications prescribed at discharge.  Review of the summary from 03/04/17-03/10/17 states that he was recently in the hospital for pneumonia and insistent on being discharged home on 6/2. On 6/3 he required readmission for shortness of breath and confusion. He was found by EMS at home without any  oxygen on and with oxygen saturations in the 70s. Was readmitted to the hospital for treatment of pneumonia, acute on chronic respiratory failure requiring BiPAP and A. fib with RVR.  He was discharged home with DO NOT RESUSCITATE and also was home with hospice and had palliative medicine follow-up during the hospitalization.    Today patient reports that his feet  have been swelling since he has been home from the hospital. Today he reports that he needs something for sleep.  Today they do bring in his medicine bottles in a brown paper bag. Specifically reviewed that his bag does include current bottle with pills in the bottle of Lasix 20--2 every morning and potassium 20 mEq every morning. Also specifically reviewed that he currently is on no medications for insomnia.  He has no other specific concerns to address today.  They report that someone with hospice/palliative care has been coming to the house on average about every other day. Says that last week they had 3 or 4 home visits and they had one yesterday.  He also brings in paper where he has been documenting blood sugar readings. June 19 he has 2 readings of 136 and 214. June 20 one reading of 130. These are the only readings that have a date documented to correlate.    - CBC with Differential/Platelet - BASIC METABOLIC PANEL WITH GFR - Brain natriuretic peptide - potassium chloride SA (K-DUR,KLOR-CON) 20 MEQ tablet; Take one in morning. Take one in early afternoon.  Dispense: 60 tablet; Refill: 2 - furosemide (LASIX) 20 MG tablet; Take 2 each morning. Take 2 in early afternoon.  Dispense: 60 tablet; Refill: 3   Insomnia, unspecified type - mirtazapine (REMERON) 30 MG tablet; Take 1 tablet (30 mg total) by mouth at bedtime.  Dispense: 30 tablet; Refill: 3  Obtain labs. Increase Lasix from 2 in the morning only to taking 2 twice a day. Increase potassium from taking 1 in the morning only to taking 1 twice a day Add Remeron for insomnia. Schedule follow-up office visit with me in one week to re-evaluate edema and insomnia.   03/28/2017: Wife and son accompany him for office visit again today.  Labs drawn at last office visit on 03/21/17 came back good. BNP was only slightly elevated at 121.5. CBC showed white count back down to normal. Anemia was stable. Bmet was good. Kidney  function back to normal with BUN 21 creatinine 1.02. GFR normal. At that time told him to increase protein in his diet to help with the lower extremity edema.  Today he does bring in fasting blood sugar readings--- the following are the recent readings from the past week-----he asked why his blood sugars are up and down/fluctuating so much-----wife states that he has been eating a lot of at sandwiches. Says that he likes to put it on pieces of white bread to make a sandwich. Says he also has been eating some boiled eggs. I then discussed other things he may be eating that would be causing sugar to go up at times but he says he has been eating about the same on each day and doesn't think he has been [name] more carbohydrates on nights prior to fasting blood sugars being high. Wife states that they do not eat pasta as far spaghetti or lasagna or pizza with pizza crust. Asked patient if he is snacking and he says no he doesn't have anything to snack on. Sounds like he is mostly eating the exam which is.  311, 358, 375, 124, 230, 259, 195, 133  He states that his swelling is improved with taking the Lasix and potassium as directed at last office visit.  He states that the insomnia medicine is working very well. He is getting good sleep now and the medicine is causing no side effects. Wife adds "he must be sleeping good he isn't waking [place] up anymore ".  He has no other concerns to address today.  They report that a nurse came to the house yesterday and checked on him and that another nurses was to be coming again sometime this week.   04/05/2017: His wife accompanies him for a visit today. His son is not here to today. Patient states that he has no swelling-- that has stayed resolved. He has no specific concerns today. He does bring in blood sugar log sheet and has the following blood sugars documented : 138, 163, 125, 162, 113, 123, 124.  I asked if they were still having nurses come to the house  frequently. He and his wife both state that they have not been having many home visits recently. Says that there has been one man from hospice that has still come but that is all.   AT THAT OV: Checked BMET and: Will have him continue current dosing of Lasix and potassium. Will check Bmet to monitor.  If this lab comes back stable and if he continues to feel stable then will plan for him to wait for follow-up office visit in one month.  If today's lab shows any issues then will address accordingly. As well I have told both patient and his wife that if he has any issues arise they can always call and schedule to come in sooner. Otherwise if things are stable can wait till follow-up one month.   BMET on 04/05/2017: Potassium was 5.3. BUN was elevated at 33. Creatinine was normal at 1.03. At that time noted that currently prior to that his Lasix was 2 in the morning, 2 in the afternoon and the potassium was 1 in the morning, 1 in the afternoon. Said that-- starting at that point ---for him to continue taking that dose on Mondays/ Wednesdays /Fridays but  on the other days of the week to change the dose to take Lasix 2 in the morning only (none later in the day). Take potassium 1 in the morning only (non-later in the day).  Continue that dose for upcoming weeks.  Patient came to the office and saw me on 04/12/17 for me to verbally review those instructions with him and his wife and also wrote down new dosing instructions on paper. His wife said that she was the one that does administer his medicines and help keep up with his medicines.   05/07/2017:  Today patient reports "swelling coming back some ". Also says "wheeze at night time too ". Later in the visit when I mentioned I would be doing some blood work he said "sugar will be high -- I have been eaten chicken and dumplins the past 2-3 days--- I like that chicken and dumplins" Verified that he has been compliant with all pulmonary medicines  and he says that he is using all his breathing medicines as directed. Also followed up to see if support caregivers are still coming to his house. "That man still comin to the house about one time every week" He has no other specific concerns today.  A/P AT THAT OV: Had gotten lower extremity edema completely resolved at  visit 04/05/17. However after that his creatinine was up some so I decrease Lasix dosing slightly---see lab result note from lab 04/05/17 Now having some mild edema since adjusted Lasix dose Today I am not changing Lasix dose again. Rather I will get lab results from today first and then decide whether to adjust Lasix. Today's edema is not nearly as bad as it had been in the past and I am okay with this level of edema  His lungs actually sound pretty clear on exam and I really do not hear any wheezing. However when just talking to him in the exam room I can hear heavy breathing and he is reporting some wheezing at night. He reports that he is being compliant in using all pulmonary medicines as directed. Will add prednisone 20 mg daily for 5 days. Told him to call me if his breathing worsens or does not return to baseline with this. - predniSONE (DELTASONE) 20 MG tablet; Take 1 tablet (20 mg total) by mouth daily with breakfast.  Dispense: 5 tablet; Refill: 0  Last A1c was performed at the hospital on 02/28/17. Therefore next A1c due 05/31/17. Therefore having him schedule follow-up visit with me for the first week of September and at that time we'll recheck A1c and returned to his normal routine visits. Note that his last R OV note was on 01/10/17 Did review that his A1c was very high at last check on 02/28/17. However I also reviewed that at recent visit he reported good blood sugar readings. He has history of noncompliance and hopefully he is being more compliant since the time of that hospitalization since he has been having further care providers at home.    06/06/2017: Today  patient states that he had something to eat and drink at about 3 AM but none since and it is now 9:20 AM. Today he reports that he is using his insulin and his breathing medicines and all medicines as directed. He says he is not out of any medications. He did not bring blood sugar log form with him to visit. Says that he forgot it. Staff noted that his oxygen saturation level is 81% today. They reviewed with him that he does have portable oxygen available at home but just did not bring it with him. Today we have discussed with him importance of wearing his oxygen at all times. Asked if anyone is still coming to his home for home visits. He says "that man came yesterday but said that that was his last time coming." He is noticing increased swelling in his legs. No other specific symptom to address.         Past Medical History:  Diagnosis Date  . Allergy    Rhinitis  . Bronchitis   . Chronic respiratory failure ([place])   . Colon polyps   . COPD (chronic obstructive pulmonary disease) ([place])   . Diabetes mellitus   . Elevated lipids   . Hypercholesterolemia   . Hypertension   . On home O2    2L N/C   . PSA elevation   . Pulmonary fibrosis ([place])   . Vitamin D deficiency      Home Meds: Outpatient Medications Prior to Visit  Medication Sig Dispense Refill  . albuterol (PROVENTIL) (2.5 MG/3ML) 0.083% nebulizer solution INHALE 1 VIAL VIA NEBULIZER EVERY 6 HOURS AS NEEDED FOR WHEEZING OR SHORTNESS OF BREATH 360 mL 5  . apixaban (ELIQUIS) 5 MG TABS tablet Take 1 tablet (5 mg total) by mouth  2 (two) times daily. 60 tablet 0  . baclofen (LIORESAL) 10 MG tablet Take 10 mg by mouth 3 (three) times daily.    . budesonide (PULMICORT) 0.5 MG/2ML nebulizer solution Take 2 mLs (0.5 mg total) by nebulization 2 (two) times daily. 120 mL 5  . diltiazem (CARDIZEM CD) 240 MG 24 hr capsule Take 1 capsule (240 mg total) by mouth daily. 30 capsule 3  . furosemide (LASIX) 20 MG tablet Take 2 each  morning. Take 2 in early afternoon on Mon.Wed,Fri Take 2 tablets in the morning on Tue.Thur, Sat,Sun 60 tablet 3  . insulin NPH Human (NOVOLIN N) 100 UNIT/ML injection 20 units with breakfast daily and 20 units with supper daily 10 mL 1  . ipratropium (ATROVENT) 0.02 % nebulizer solution Take 2.5 mLs (0.5 mg total) by nebulization 4 (four) times daily. 25 mL 12  . losartan (COZAAR) 100 MG tablet TAKE ONE TABLET BY MOUTH DAILY. 30 tablet 1  . metFORMIN (GLUCOPHAGE) 1000 MG tablet TAKE ONE TABLET BY MOUTH TWO TIMES DAILY. 60 tablet 1  . metoprolol tartrate (LOPRESSOR) 25 MG tablet Take 1 tablet (25 mg total) by mouth 2 (two) times daily. 60 tablet 1  . mirtazapine (REMERON) 30 MG tablet Take 1 tablet (30 mg total) by mouth at bedtime. 30 tablet 3  . omeprazole (PRILOSEC) 20 MG capsule Take 1 capsule (20 mg total) by mouth daily. 30 capsule 3  . ONE TOUCH ULTRA TEST test strip USE TO CHECK BLOOD SUGAR EACH MORNING AND THEN 2 HOURS AFTER ANY MEAL 100 each 2  . ONETOUCH DELICA LANCETS 29J MISC USE TO CHECK BLOOD SUGAR TWICE DAILY AS DIRECTED 100 each 2  . potassium chloride SA (K-DUR,KLOR-CON) 20 MEQ tablet Take one in morning,take one in early afternoon on Mon.Wed,Fri, Take 1 tablet in the morning on Tue,Thur, Sat,Sun 30 tablet 0  . pravastatin (PRAVACHOL) 80 MG tablet TAKE ONE TABLET BY MOUTH DAILY AT BEDTIME. 30 tablet 1  . PROAIR RESPICLICK 188 (90 Base) MCG/ACT AEPB INHALE 2 PUFFS INTO LUNGS EVERY 6 HOURS AS NEEDED FOR WHEEZING OR SHORTNESS OF BREATH 1 each 5  . predniSONE (DELTASONE) 10 MG tablet Take 5 tablets (50 mg total) by mouth daily with breakfast. And decrease by one tablet daily 15 tablet 0  . predniSONE (DELTASONE) 20 MG tablet Take 1 tablet (20 mg total) by mouth daily with breakfast. 5 tablet 0   No facility-administered medications prior to visit.      Allergies:  Allergies  Allergen Reactions  . Ace Inhibitors Other (See Comments)    Hyperkalemia--07/23/2013:patient states not  familiar with the following allergy    Social History   Social History  . Marital status: Married    Spouse name: N/A  . Number of children: N/A  . Years of education: N/A   Occupational History  . 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 Smoking status: Former Smoker    Packs/day: 1.50    Years: 60.00    Types: Cigarettes    Quit date: 12/31/2012  . Smokeless tobacco: Never Used  . Alcohol use No  . Drug use: No  . Sexual activity: Yes    Birth control/ protection: None   Other Topics Concern  . Not on file   Social History Narrative  . No narrative on file    Family History  Problem Relation Age of Onset  . Heart disease Mother   . CAD Other   .  Diabetes Other      Review of Systems:  See HPI for pertinent ROS. All other ROS negative.    Physical Exam: Blood pressure 140/64, pulse 94, temperature 98.2 F (36.8 C), temperature source Oral, resp. rate 20, height 5\' 5"  (1.651 m), weight 189 lb (85.7 kg), SpO2 (!) 81 %., Body mass index is 31.45 kg/m.  General: WNWD WM, "Weathered Appearance" Appears in no acute distress. Neck: Supple. No thyromegaly. No lymphadenopathy. No carotid bruits. Lungs: Diostant, decreased breath sounds but no wheezes on exam today Heart: RRR with S1 S2. No murmurs, rubs, or gallops. Abdomen: Soft, non-tender, non-distended with normoactive bowel sounds. No hepatomegaly. No rebound/guarding. No obvious abdominal masses. Musculoskeletal:  Strength and tone normal for age. Extremities/Skin: 2+ - 3+ pitting edema up calves bilaterally. Neuro: Alert and oriented X 3. Moves all extremities spontaneously. Gait is normal. CNII-XII grossly in tact. Psych:  Responds to questions appropriately with a normal affect. Diabetic foot exam: Inspection is normal. No wounds or concerning lesions. Sensation is intact. On the right: 2+ dorsalis pedis 2+ PT.  On the left--no palpable DP. 2+ PT.      ASSESSMENT AND PLAN:  82 y.o.  year old male with   Chronic diastolic heart failure ([name]) Currently his Lasix is 20 mg and he is taking 2 every morning and then only on Monday Wednesday Friday he takes an additional 2 on those afternoons. The other days of the week he is only taking the 2 in the morning and no afternoon dose.  At this time I am increasing his Lasix dose as documented below. At this time I am also increasing his potassium dose as documented below. Check lab to monitor. Plan follow-up OV this upcoming Monday-- 5 days from now. - COMPLETE METABOLIC PANEL WITH GFR - furosemide (LASIX) 20 MG tablet; Take 3 each morning.  Take 2 each day in early afternoon ( ~ 1:00 - 2:00 pm)  Dispense: 120 tablet; Refill: 3 - potassium chloride SA (K-DUR,KLOR-CON) 20 MEQ tablet; Take one in the morning, one in the afternoon.  Dispense: 60 tablet; Refill: 3  CKD (chronic kidney disease), stage III Will check labs now. We'll recheck lab at follow-up visit Monday as adjusting Lasix and potassium. - COMPLETE METABOLIC PANEL WITH GFR  Bilateral lower extremity edema Currently his Lasix is 20 mg and he is taking 2 every morning and then only on Monday Wednesday Friday he takes an additional 2 on those afternoons. The other days of the week he is only taking the 2 in the morning and no afternoon dose.  At this time I am increasing his Lasix dose as documented below. At this time I am also increasing his potassium dose as documented below. Check lab to monitor.  - COMPLETE METABOLIC PANEL WITH GFR - furosemide (LASIX) 20 MG tablet; Take 3 each morning.  Take 2 each day in early afternoon ( ~ 1:00 - 2:00 pm)  Dispense: 120 tablet; Refill: 3 - potassium chloride SA (K-DUR,KLOR-CON) 20 MEQ tablet; Take one in the morning, one in the afternoon.  Dispense: 60 tablet; Refill: 3     History of repeated noncompliance in the past secondary to finances.  In the past he frequently could not afford all of his oral diabetic medications. In  the past has also reported that he is unable to afford his Spiriva and Symbicort. We have been providing him with samples of both Spiriva and Symbicort. As well, we have been providing him with samples of  Lantus or Levemir as often as possible. At his visit with me 03/12/14 I had [name] to contact [employer] to see whether they can help provide some assistance to him. [facility] follow-up with him but he did not want their assistance --01/2015--[facility] has reached out to pt again.  Is now on Neb meds At [place] 05/27/2015----I asked pt about "Out Reach" and he says "no"--nobody coming to his house, nobody assisting him with his treatment-- I then reviewed further---[facility] Note 03/22/2015---documents that pt does not want further f/u from them--"does not see the need"  At [facility] 12/01/2015--Reports not currently using Pulmonary meds sec to cost---At that OV I gave him 2 samples of Advair to use for now.   AT [facility] 07/06/2016---Gave him samples of Long-acting Insulin Pen---Gave him 3 or 4 At [facility] 07/06/2016---Gave him 3 samples of Steolto  01/10/2017: Day I have reviewed note from home nurse visit to his home on 01/08/17. At that visit blood pressure was 120/64. A1c greater than 13. Blood sugar 571. See history of present illness from 01/10/17 for further details of this. 01/10/2017--- he came here on Monday 01/08/17 and I gave him 2 boxes of long-acting insulin pen. Him 3 more insulin pens. They have also given him for Vioxx is of Symbicort/pre-oh. I have explained to him that these are same type of medicine and to just use one of these at a time but this way gives him a 4 month supply of long-acting COPD medicine maintenance medicine. 06/06/2017: He reports that he is being compliant with all medications and is not out of any medications at this time including insulin and pulmonary meds.   History of smoking.  -----06/06/2017---He quit smoking January 2014. Congratulations and staying off of the cigarettes!!   COPD At [facility]  10/11/2016 I have given him sample of Symbicort 2 boxes of this. Told him to start using this twice a day. Also gave him prescription for prednisone-- see above for COPD exacerbation At [facility] 08/30/15--will add low dose prednisone---do not want BS to increase furhter but do not want him to develop COPD exacerbation Status post ER visit for COPD on 11/04/13 Another ER visit for COPD on 11/06/13 Status post hospitalization for COPD exacerbation 11/15/13 See HPI regarding Spiriva and Symbicort.  He is on now on Pulmicort nebulizer (budesonide) He is now on Stiolto Respimat (tiotropium bromide) He has oxygen nasal cannula at home but is not using it regularly.  01/10/2017--- provided him 4 boxes of samples of Breo and Symbicort and explained to use one or the other of these--- this way these will last him 4 months of maintenance med. 06/06/2017----- discussed use of oxygen at all times. Continue current pulmonary medications.    Diabetes  At [facility] 03/30/2016---Instructed him to INCREASE INSULIN TO 70 UNITS QHS.--THIS IS LAST DOSE I INSTRUCTED HIM TO DO OV 10/11/16 I gave him 3 boxes of Basaglar. Told him to use this same amount of this as he did with his Levemir/other insulin in the past. Continues these 3 samples to help hold him over.  Microalbumin 05/27/2015, 07/06/2016 I have discussed need for routine eye exams and dental exams but he hasn't been able to followup with the secondary to finances. On ARB. (H/O hyperkalemia with ACE Inh) On statin. On ASA 81mg .   01/10/2017--- see history of present illness from this date. Today I've given him more samples of long-acting insulins. I did not check A1C here today because he just had one checked at his home on  01/08/17 and A1c was greater than 13 and blood sugar was 571.  06/06/2017--- he did not bring blood sugar log to office visit today. Says that he forgot it. Today I'm checking A1c. ------------- last microalbumin 07/06/16. Will need to recheck this at next  OV.  Hypertension At [facility] 07/23/13 his potassium was even higher. It had been borderline high prior to this. At that time his ACE inhibitor was stopped and he was started on ARB. BP hgih at [place] 05/27/2015----will monitor prior to adjusting meds---pt says "it is b/c he has been running ragged b/c of his wife being in the hospital" BP good at OV 08/30/15 01/10/2017----BP reading a little high at visit today. However nurse blood pressure check at his house on 01/08/2017 showed 120/64. Therefore we'll not make changes in blood pressure medicine at this time. 06/06/2017--- BP is good today. Continue current medications. Check lab to monitor.  Elevated lipids He is on Pravastatin 80mg . FLP excellent 02/25/2015 with LDL 68. LFTs were normal. 01/10/2017--- LFTs were normal on lab 1/18. Last lipid panel was good. Continue current medicine. Wait to recheck lab. 06/06/2017--------he is fasting. Will check FLP LFT to monitor. He is on pravastatin.  Pulmonary fibrosis 06/06/2017---Managed by Pulmonary  Elevated PSA He had elevated PSA at [facility] 07/2012. Also prior to this PSA was elevated. He has been informed this could be secondary to prostate cancer but refused followup with urology.  06/06/2017--He still refuses to recheck PSA. He is aware that he could have prostate cancer but does not want further evaluation or followup with his AT ALL.  Colon polyps History of Hemoccult-positive stool x3 in 07/2008. Refused GI evaluation despite being informed of risk of severe GI bleed, cancer, et Ronney Asters. H./H. normal 03/2010 and 01/2011. 06/06/2017---Refuses colonoscopy.  Immunizations: We have discussed influenza vaccine last flu season and he is deferred. 06/2017. He again defers for this flu season. Pneumonia vaccine: He received Pneumovax June 2003.  Received  Prevnar 13  10/23/2013 .  Tetanus: Gave in September 2005. Not covered by Medicare --therefore has deferred f/u sec to cost.  Zostavax: He has had so much problem with  compliance, I have not even discussed this with pt!!   Signed, [address] [place], [place], [facility] 06/06/2017 8:48 AM

## 2017-06-06 NOTE — Telephone Encounter (Signed)
Spoke with [employer] and they did not receive the original fax. I was provided with a different number to fax over Rx will follow up to make ure they received the R . Spoke with MR Wollard in office today he is aware that I am working on this for him

## 2017-06-07 ENCOUNTER — Telehealth: Payer: Self-pay | Admitting: Physician Assistant

## 2017-06-07 DIAGNOSIS — Z09 Encounter for follow-up examination after completed treatment for conditions other than malignant neoplasm: Secondary | ICD-10-CM

## 2017-06-07 DIAGNOSIS — R600 Localized edema: Secondary | ICD-10-CM

## 2017-06-07 DIAGNOSIS — I5032 Chronic diastolic (congestive) heart failure: Secondary | ICD-10-CM

## 2017-06-07 LAB — COMPLETE METABOLIC PANEL WITH GFR
AG Ratio: 1.3 (calc) (ref 1.0–2.5)
ALBUMIN MSPROF: 3.6 g/dL (ref 3.6–5.1)
ALT: 11 U/L (ref 9–46)
AST: 10 U/L (ref 10–35)
Alkaline phosphatase (APISO): 65 U/L (ref 40–115)
BUN / CREAT RATIO: 34 (calc) — AB (ref 6–22)
BUN: 40 mg/dL — AB (ref 7–25)
CO2: 28 mmol/L (ref 20–32)
CREATININE: 1.17 mg/dL (ref 0.70–1.18)
Calcium: 8.9 mg/dL (ref 8.6–10.3)
Chloride: 95 mmol/L — ABNORMAL LOW (ref 98–110)
GFR, EST AFRICAN AMERICAN: 68 mL/min/{1.73_m2} (ref >=60)
GFR, EST NON AFRICAN AMERICAN: 59 mL/min/{1.73_m2} — AB (ref >=60)
GLUCOSE: 399 mg/dL — AB (ref 65–99)
Globulin: 2.7 g/dL (calc) (ref 1.9–3.7)
Potassium: 5.4 mmol/L — ABNORMAL HIGH (ref 3.5–5.3)
Sodium: 133 mmol/L — ABNORMAL LOW (ref 135–146)
TOTAL PROTEIN: 6.3 g/dL (ref 6.1–8.1)
Total Bilirubin: 0.2 mg/dL (ref 0.2–1.2)

## 2017-06-07 LAB — LIPID PANEL
CHOL/HDL RATIO: 2.7 (calc) (ref <5.0)
CHOLESTEROL: 179 mg/dL (ref <200)
HDL: 67 mg/dL (ref >40)
Non-HDL Cholesterol (Calc): 112 mg/dL (calc) (ref <130)
TRIGLYCERIDES: 440 mg/dL — AB (ref <150)

## 2017-06-07 LAB — HEMOGLOBIN A1C: Hgb A1c MFr Bld: >14 % of total Hgb — ABNORMAL HIGH (ref <5.7)

## 2017-06-07 MED ORDER — POTASSIUM CHLORIDE CRYS ER 20 MEQ PO TBCR: EXTENDED_RELEASE_TABLET | ORAL | 3 refills | Status: DC

## 2017-06-07 NOTE — Telephone Encounter (Signed)
Medication called/sent to requested pharmacy  

## 2017-06-07 NOTE — Telephone Encounter (Signed)
Pt needs refill on potassium chloride sent to [facility] [place].

## 2017-06-07 NOTE — Telephone Encounter (Signed)
All notes have been faxed over I called and spoke with Wells Guiles and she indicated that they were working on the order and would call if any additional information was needed.

## 2017-06-11 ENCOUNTER — Ambulatory Visit (INDEPENDENT_AMBULATORY_CARE_PROVIDER_SITE_OTHER): Payer: Medicare Other | Admitting: Physician Assistant

## 2017-06-11 ENCOUNTER — Encounter: Payer: Self-pay | Admitting: Physician Assistant

## 2017-06-11 VITALS — BP 150/94 | HR 97 | Temp 98.1°F | Resp 18 | Ht 65.0 in

## 2017-06-11 DIAGNOSIS — I5032 Chronic diastolic (congestive) heart failure: Secondary | ICD-10-CM

## 2017-06-11 DIAGNOSIS — E1165 Type 2 diabetes mellitus with hyperglycemia: Secondary | ICD-10-CM

## 2017-06-11 DIAGNOSIS — Z794 Long term (current) use of insulin: Secondary | ICD-10-CM

## 2017-06-11 DIAGNOSIS — L03115 Cellulitis of right lower limb: Secondary | ICD-10-CM | POA: Diagnosis not present

## 2017-06-11 LAB — BASIC METABOLIC PANEL WITH GFR
BUN/Creatinine Ratio: 31 (calc) — ABNORMAL HIGH (ref 6–22)
BUN: 36 mg/dL — AB (ref 7–25)
CO2: 31 mmol/L (ref 20–32)
CREATININE: 1.16 mg/dL (ref 0.70–1.18)
Calcium: 9.7 mg/dL (ref 8.6–10.3)
Chloride: 90 mmol/L — ABNORMAL LOW (ref 98–110)
GFR, Est African American: 69 mL/min/{1.73_m2} (ref >=60)
GFR, Est Non African American: 60 mL/min/{1.73_m2} (ref >=60)
GLUCOSE: 459 mg/dL — AB (ref 65–99)
Potassium: 4.9 mmol/L (ref 3.5–5.3)
Sodium: 130 mmol/L — ABNORMAL LOW (ref 135–146)

## 2017-06-11 MED ORDER — CEPHALEXIN 500 MG PO CAPS: 500.0000 mg | ORAL_CAPSULE | Freq: Four times a day (QID) | ORAL | 0 refills | Status: DC

## 2017-06-11 NOTE — Progress Notes (Signed)
Patient ID: Edward [name] MRN: [id number], DOB: 12-Jan-1935, 82 y.o. Date of Encounter: @DATE @  Chief Complaint:  Chief Complaint  Patient presents with  . Follow-up    HPI: 82 y.o. year old male  presents to f/u regarding LE edema and recent A1C.  His last office visit with me was just recently on 06/06/17. At the time of that visit he had been taking Lasix 20 mg tablets and was taking 2 every morning and then only on Mondays Wednesdays Fridays he would take an additional 2 on those afternoons.  On other days of the weeks he was only taking the 2 in the morning and no afternoon dose. At the time of that office visit I had him increase the Lasix 20 mg tablets to taking 3 every morning and taking 2 each day in early afternoon all days of the week.  Also for his potassium which is a 20 mEq tablet he was told to take 1 in the morning and 1 in the afternoon.  At that visit I also checked an A1c and this came back elevated at > 14.0 When  got that result, I planned for him to come back in for follow-up visit and bring his blood sugar meter so that I could review his blood sugar readings.   Today he reports that he has been taking the Lasix and the potassium as directed at last office visit, as documented above. Today he does bring in his blood sugar meter. Today when I  verified with him his current insulin dose-- he says that he was giving 20 twice a day but then changed to just doing 40 once a day.  Makes mention that " sometimes when he puts the needle in, the syringe looks empty so [he] doesn't even know how much medicine he is getting in" In the past I have him on once daily pens but I guess while he was at the hospital this got changed to needle and syringe and probably because of cost and past noncompliance because of cost he currently is on Novolin N..     Past Medical History:  Diagnosis Date  . Allergy    Rhinitis  . Bronchitis   . Chronic respiratory failure ([place])   .  Colon polyps   . COPD (chronic obstructive pulmonary disease) ([place])   . Diabetes mellitus   . Elevated lipids   . Hypercholesterolemia   . Hypertension   . On home O2    2L N/C   . PSA elevation   . Pulmonary fibrosis ([place])   . Vitamin D deficiency      Home Meds: Outpatient Medications Prior to Visit  Medication Sig Dispense Refill  . albuterol (PROVENTIL) (2.5 MG/3ML) 0.083% nebulizer solution INHALE 1 VIAL VIA NEBULIZER EVERY 6 HOURS AS NEEDED FOR WHEEZING OR SHORTNESS OF BREATH 360 mL 5  . apixaban (ELIQUIS) 5 MG TABS tablet Take 1 tablet (5 mg total) by mouth 2 (two) times daily. 60 tablet 0  . baclofen (LIORESAL) 10 MG tablet Take 10 mg by mouth 3 (three) times daily.    . budesonide (PULMICORT) 0.5 MG/2ML nebulizer solution Take 2 mLs (0.5 mg total) by nebulization 2 (two) times daily. 120 mL 5  . diltiazem (CARDIZEM CD) 240 MG 24 hr capsule Take 1 capsule (240 mg total) by mouth daily. 30 capsule 3  . furosemide (LASIX) 20 MG tablet Take 3 each morning.  Take 2 each day in early afternoon ( ~ 1:00 -  2:00 pm) 120 tablet 3  . furosemide (LASIX) 20 MG tablet TAKE 2 TABLETS BY MOUTH EACH  MORNING AND 2 TABLETS IN EARLY AFTERNOON 60 tablet 3  . insulin NPH Human (NOVOLIN N) 100 UNIT/ML injection 20 units with breakfast daily and 20 units with supper daily 10 mL 1  . ipratropium (ATROVENT) 0.02 % nebulizer solution Take 2.5 mLs (0.5 mg total) by nebulization 4 (four) times daily. 25 mL 12  . losartan (COZAAR) 100 MG tablet TAKE ONE TABLET BY MOUTH DAILY. 30 tablet 1  . metFORMIN (GLUCOPHAGE) 1000 MG tablet TAKE ONE TABLET BY MOUTH TWO TIMES DAILY. 60 tablet 1  . metoprolol tartrate (LOPRESSOR) 25 MG tablet Take 1 tablet (25 mg total) by mouth 2 (two) times daily. 60 tablet 1  . mirtazapine (REMERON) 30 MG tablet Take 1 tablet (30 mg total) by mouth at bedtime. 30 tablet 3  . omeprazole (PRILOSEC) 20 MG capsule Take 1 capsule (20 mg total) by mouth daily. 30 capsule 3  . ONE TOUCH  ULTRA TEST test strip USE TO CHECK BLOOD SUGAR EACH MORNING AND THEN 2 HOURS AFTER ANY MEAL 100 each 2  . ONETOUCH DELICA LANCETS 69G MISC USE TO CHECK BLOOD SUGAR TWICE DAILY AS DIRECTED 100 each 2  . potassium chloride SA (K-DUR,KLOR-CON) 20 MEQ tablet Take one in the morning, one in the afternoon. 60 tablet 3  . pravastatin (PRAVACHOL) 80 MG tablet TAKE ONE TABLET BY MOUTH DAILY AT BEDTIME. 30 tablet 1  . predniSONE (DELTASONE) 10 MG tablet Take 5 tablets (50 mg total) by mouth daily with breakfast. And decrease by one tablet daily 15 tablet 0  . predniSONE (DELTASONE) 20 MG tablet Take 1 tablet (20 mg total) by mouth daily with breakfast. 5 tablet 0  . PROAIR RESPICLICK 295 (90 Base) MCG/ACT AEPB INHALE 2 PUFFS INTO LUNGS EVERY 6 HOURS AS NEEDED FOR WHEEZING OR SHORTNESS OF BREATH 1 each 5   No facility-administered medications prior to visit.     Allergies:  Allergies  Allergen Reactions  . Ace Inhibitors Other (See Comments)    Hyperkalemia--07/23/2013:patient states not familiar with the following allergy    Social History   Social History  . Marital status: Married    Spouse name: N/A  . Number of children: N/A  . Years of education: N/A   Occupational History  . 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 Smoking status: Former Smoker    Packs/day: 1.50    Years: 60.00    Types: Cigarettes    Quit date: 12/31/2012  . Smokeless tobacco: Never Used  . Alcohol use No  . Drug use: No  . Sexual activity: Yes    Birth control/ protection: None   Other Topics Concern  . Not on file   Social History Narrative  . No narrative on file    Family History  Problem Relation Age of Onset  . Heart disease Mother   . CAD Other   . Diabetes Other      Review of Systems:  See HPI for pertinent ROS. All other ROS negative.    Physical Exam: Blood pressure (!) 150/94, pulse 97, temperature 98.1 F (36.7 C), temperature source Oral, resp. rate 18, height  5\' 5"  (1.651 m), SpO2 (!) 86 %., There is no height or weight on file to calculate BMI. General: Appears in no acute distress. Neck: Supple. No thyromegaly. No lymphadenopathy. Lungs: Clear bilaterally to auscultation  without wheezes, rales, or rhonchi. Breathing is unlabored. Heart: RRR with S1 S2. No murmurs, rubs, or gallops. Musculoskeletal:  Strength and tone normal for age. Extremities/Skin: 2+ pitting edema up calves, shins. Dorsum of right foot with diffuse light pink coloration. Neuro: Alert and oriented X 3. Moves all extremities spontaneously. Gait is normal. CNII-XII grossly in tact. Psych:  Responds to questions appropriately with a normal affect.     ASSESSMENT AND PLAN:  82 y.o. year old male with  1. Chronic diastolic heart failure ([place]) Check lab to monitor since we've been adjusting diuretic and potassium dose. Otherwise at this point we'll plan to continue current dosing of Lasix and potassium. His BUN has been high indicating dehydration. I'm concerned that most of his swelling at this point is because of his uncontrolled blood sugar. - BASIC METABOLIC PANEL WITH GFR  2. Uncontrolled type 2 diabetes mellitus with hyperglycemia, with long-term current use of insulin ([name]) In the past he had problems affording his insulin pens and I think that [facility] and other community staff have determined that he needs to be on Novolin N for cost purposes. Will have him adjust this dose to 30 units twice daily. Will have him return in one week and bring his blood sugar meter. - BASIC METABOLIC PANEL WITH GFR  3. Cellulitis of right lower extremity I am concerned that he is starting to develop some cellulitis on his right foot. Will have him add Keflex. F/U Visit 1 week. Follow-up sooner if this worsens. - cephALEXin (KEFLEX) 500 MG capsule; Take 1 capsule (500 mg total) by mouth 4 (four) times daily.  Dispense: 28 capsule; Refill: 0   Signed, [address] [place], [place], [facility] 06/11/2017 8:16  AM

## 2017-06-19 ENCOUNTER — Ambulatory Visit: Payer: Medicare Other | Admitting: Family Medicine

## 2017-06-20 ENCOUNTER — Ambulatory Visit (INDEPENDENT_AMBULATORY_CARE_PROVIDER_SITE_OTHER): Payer: Medicare Other | Admitting: Physician Assistant

## 2017-06-20 ENCOUNTER — Encounter: Payer: Self-pay | Admitting: Physician Assistant

## 2017-06-20 VITALS — BP 120/80 | HR 78 | Temp 98.2°F | Resp 18 | Ht 65.0 in | Wt 182.2 lb

## 2017-06-20 DIAGNOSIS — R600 Localized edema: Secondary | ICD-10-CM | POA: Diagnosis not present

## 2017-06-20 DIAGNOSIS — Z794 Long term (current) use of insulin: Secondary | ICD-10-CM | POA: Diagnosis not present

## 2017-06-20 DIAGNOSIS — N183 Chronic kidney disease, stage 3 unspecified: Secondary | ICD-10-CM

## 2017-06-20 DIAGNOSIS — I5032 Chronic diastolic (congestive) heart failure: Secondary | ICD-10-CM

## 2017-06-20 DIAGNOSIS — Z09 Encounter for follow-up examination after completed treatment for conditions other than malignant neoplasm: Secondary | ICD-10-CM | POA: Diagnosis not present

## 2017-06-20 DIAGNOSIS — Z9114 Patient's other noncompliance with medication regimen: Secondary | ICD-10-CM | POA: Diagnosis not present

## 2017-06-20 DIAGNOSIS — Z91148 Patient's other noncompliance with medication regimen for other reason: Secondary | ICD-10-CM

## 2017-06-20 DIAGNOSIS — E1165 Type 2 diabetes mellitus with hyperglycemia: Secondary | ICD-10-CM | POA: Diagnosis not present

## 2017-06-20 LAB — BASIC METABOLIC PANEL WITH GFR
BUN: 17 mg/dL (ref 7–25)
CHLORIDE: 96 mmol/L — AB (ref 98–110)
CO2: 30 mmol/L (ref 20–32)
Calcium: 8.9 mg/dL (ref 8.6–10.3)
Creat: 1.12 mg/dL (ref 0.70–1.18)
GFR, Est African American: 72 mL/min/{1.73_m2} (ref >=60)
GFR, Est Non African American: 62 mL/min/{1.73_m2} (ref >=60)
Glucose, Bld: 157 mg/dL — ABNORMAL HIGH (ref 65–99)
POTASSIUM: 4.4 mmol/L (ref 3.5–5.3)
Sodium: 137 mmol/L (ref 135–146)

## 2017-06-20 MED ORDER — INSULIN NPH (HUMAN) (ISOPHANE) 100 UNIT/ML ~~LOC~~ SUSP: SUBCUTANEOUS | 1 refills | Status: DC

## 2017-06-20 MED ORDER — POTASSIUM CHLORIDE CRYS ER 20 MEQ PO TBCR: EXTENDED_RELEASE_TABLET | ORAL | 3 refills | Status: DC

## 2017-06-20 MED ORDER — FUROSEMIDE 40 MG PO TABS: 80.0000 mg | ORAL_TABLET | Freq: Two times a day (BID) | ORAL | 2 refills | Status: DC

## 2017-06-20 NOTE — Progress Notes (Signed)
Patient ID: Edward [name] MRN: [id number], DOB: [birth year], 82 y.o. Date of Encounter: @DATE @  Chief Complaint:  Chief Complaint  Patient presents with  . routine follow up    HPI: 82 y.o. year old male  Presents for f/u of LE edema and Uncontrolled diabetes.   06/11/2017: presents to f/u regarding LE edema and recent A1C.  His last office visit with me was just recently on 06/06/17. At the time of that visit he had been taking Lasix 20 mg tablets and was taking 2 every morning and then only on Mondays Wednesdays Fridays he would take an additional 2 on those afternoons.  On other days of the weeks he was only taking the 2 in the morning and no afternoon dose. At the time of that office visit I had him increase the Lasix 20 mg tablets to taking 3 every morning and taking 2 each day in early afternoon all days of the week.  Also for his potassium which is a 20 mEq tablet he was told to take 1 in the morning and 1 in the afternoon.  At that visit I also checked an A1c and this came back elevated at > 14.0 When  got that result, I planned for him to come back in for follow-up visit and bring his blood sugar meter so that I could review his blood sugar readings.   Today he reports that he has been taking the Lasix and the potassium as directed at last office visit, as documented above. Today he does bring in his blood sugar meter. Today when I  verified with him his current insulin dose-- he says that he was giving 20 twice a day but then changed to just doing 40 once a day.  Makes mention that " sometimes when he puts the needle in, the syringe looks empty so [he] doesn't even know how much medicine he is getting in" In the past I have him on once daily pens but I guess while he was at the hospital this got changed to needle and syringe and probably because of cost and past noncompliance because of cost he currently is on Novolin N..   AT THAT OV:  1. Chronic diastolic heart failure  ([place]) Check lab to monitor since we've been adjusting diuretic and potassium dose. Otherwise at this point we'll plan to continue current dosing of Lasix and potassium. His BUN has been high indicating dehydration. I'm concerned that most of his swelling at this point is because of his uncontrolled blood sugar. - BASIC METABOLIC PANEL WITH GFR  2. Uncontrolled type 2 diabetes mellitus with hyperglycemia, with long-term current use of insulin ([name]) In the past he had problems affording his insulin pens and I think that [facility] and other community staff have determined that he needs to be on Novolin N for cost purposes. Will have him adjust this dose to 30 units twice daily. Will have him return in one week and bring his blood sugar meter. - BASIC METABOLIC PANEL WITH GFR  3. Cellulitis of right lower extremity I am concerned that he is starting to develop some cellulitis on his right foot. Will have him add Keflex. F/U Visit 1 week. Follow-up sooner if this worsens. - cephALEXin (KEFLEX) 500 MG capsule; Take 1 capsule (500 mg total) by mouth 4 (four) times daily.  Dispense: 28 capsule; Refill: 0     06/20/2017: Regarding the insulin he says "I'm doing 30 ---2 times a day" without me giving  any prompts.  Therefore I do think that he is truly giving this amount. He did bring in his meter today and I have gone through daily readings on that. All readings are done at around 2 AM to 4 AM. Back prior to last visit those readings were 302, 484, 399, 394. Those were on the following dates: 06/10/17-------- 484    06/11/17--------399 06/12/2017-----394 06/13/2017-----149 06/14/2017-----292 9/14------------131 9/15----------- 133 9/16------------180 9/17--------------149 9/18 @ 2:05am----167   His wife is not with him at visit today. However he tells me that "she sits at the table right next to me when I take my medicine and makes sure I'm taking it right"  Says that he is taking his Lasix and  potassium as I had directed ---the Lasix has been 20 mg tablets taking 3 in the morning and 2 in the afternoon all days a week. His potassium is 20 mEq tablet and he has been taking 1 in the morning and 1 in the afternoon. Also he did take the Keflex as directed.  He has no additional concerns to address today. Is just here for follow-up.   Past Medical History:  Diagnosis Date  . Allergy    Rhinitis  . Bronchitis   . Chronic respiratory failure ([place])   . Colon polyps   . COPD (chronic obstructive pulmonary disease) ([place])   . Diabetes mellitus   . Elevated lipids   . Hypercholesterolemia   . Hypertension   . On home O2    2L N/C   . PSA elevation   . Pulmonary fibrosis ([place])   . Vitamin D deficiency      Home Meds: Outpatient Medications Prior to Visit  Medication Sig Dispense Refill  . albuterol (PROVENTIL) (2.5 MG/3ML) 0.083% nebulizer solution INHALE 1 VIAL VIA NEBULIZER EVERY 6 HOURS AS NEEDED FOR WHEEZING OR SHORTNESS OF BREATH 360 mL 5  . budesonide (PULMICORT) 0.5 MG/2ML nebulizer solution Take 2 mLs (0.5 mg total) by nebulization 2 (two) times daily. 120 mL 5  . diltiazem (CARDIZEM CD) 240 MG 24 hr capsule Take 1 capsule (240 mg total) by mouth daily. 30 capsule 3  . ipratropium (ATROVENT) 0.02 % nebulizer solution Take 2.5 mLs (0.5 mg total) by nebulization 4 (four) times daily. 25 mL 12  . losartan (COZAAR) 100 MG tablet TAKE ONE TABLET BY MOUTH DAILY. 30 tablet 1  . metFORMIN (GLUCOPHAGE) 1000 MG tablet TAKE ONE TABLET BY MOUTH TWO TIMES DAILY. 60 tablet 1  . metoprolol tartrate (LOPRESSOR) 25 MG tablet Take 1 tablet (25 mg total) by mouth 2 (two) times daily. 60 tablet 1  . mirtazapine (REMERON) 30 MG tablet Take 1 tablet (30 mg total) by mouth at bedtime. 30 tablet 3  . omeprazole (PRILOSEC) 20 MG capsule Take 1 capsule (20 mg total) by mouth daily. 30 capsule 3  . ONE TOUCH ULTRA TEST test strip USE TO CHECK BLOOD SUGAR EACH MORNING AND THEN 2 HOURS AFTER ANY MEAL  100 each 2  . ONETOUCH DELICA LANCETS 71G MISC USE TO CHECK BLOOD SUGAR TWICE DAILY AS DIRECTED 100 each 2  . pravastatin (PRAVACHOL) 80 MG tablet TAKE ONE TABLET BY MOUTH DAILY AT BEDTIME. 30 tablet 1  . PROAIR RESPICLICK 626 (90 Base) MCG/ACT AEPB INHALE 2 PUFFS INTO LUNGS EVERY 6 HOURS AS NEEDED FOR WHEEZING OR SHORTNESS OF BREATH 1 each 5  . furosemide (LASIX) 20 MG tablet Take 3 each morning.  Take 2 each day in early afternoon ( ~ 1:00 - 2:00  pm) 120 tablet 3  . furosemide (LASIX) 20 MG tablet TAKE 2 TABLETS BY MOUTH EACH  MORNING AND 2 TABLETS IN EARLY AFTERNOON 60 tablet 3  . insulin NPH Human (NOVOLIN N) 100 UNIT/ML injection 20 units with breakfast daily and 20 units with supper daily 10 mL 1  . potassium chloride SA (K-DUR,KLOR-CON) 20 MEQ tablet Take one in the morning, one in the afternoon. 60 tablet 3  . apixaban (ELIQUIS) 5 MG TABS tablet Take 1 tablet (5 mg total) by mouth 2 (two) times daily. 60 tablet 0  . baclofen (LIORESAL) 10 MG tablet Take 10 mg by mouth 3 (three) times daily.    . predniSONE (DELTASONE) 10 MG tablet Take 5 tablets (50 mg total) by mouth daily with breakfast. And decrease by one tablet daily (Patient not taking: Reported on 06/20/2017) 15 tablet 0  . cephALEXin (KEFLEX) 500 MG capsule Take 1 capsule (500 mg total) by mouth 4 (four) times daily. 28 capsule 0  . predniSONE (DELTASONE) 20 MG tablet Take 1 tablet (20 mg total) by mouth daily with breakfast. 5 tablet 0   No facility-administered medications prior to visit.     Allergies:  Allergies  Allergen Reactions  . Ace Inhibitors Other (See Comments)    Hyperkalemia--07/23/2013:patient states not familiar with the following allergy    Social History   Social History  . Marital status: Married    Spouse name: N/A  . Number of children: N/A  . Years of education: N/A   Occupational History  . 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 Smoking status: Former Smoker     Packs/day: 1.50    Years: 60.00    Types: Cigarettes    Quit date: 12/31/2012  . Smokeless tobacco: Never Used  . Alcohol use No  . Drug use: No  . Sexual activity: Yes    Birth control/ protection: None   Other Topics Concern  . Not on file   Social History Narrative  . No narrative on file    Family History  Problem Relation Age of Onset  . Heart disease Mother   . CAD Other   . Diabetes Other      Review of Systems:  See HPI for pertinent ROS. All other ROS negative.    Physical Exam: Blood pressure 120/80, pulse 78, temperature 98.2 F (36.8 C), temperature source Oral, resp. rate 18, height 5\' 5"  (1.651 m), weight 82.6 kg (182 lb 3.2 oz), SpO2 97 %., Body mass index is 30.32 kg/m. General: Appears in no acute distress. Neck: Supple. No thyromegaly. No lymphadenopathy. Lungs: Distant/decreased breath sounds throughout. Slight wheeze throughout bilaterally. Heart: RRR with S1 S2. No murmurs, rubs, or gallops. Musculoskeletal:  Strength and tone normal for age. Extremities/Skin: 2+ - 3+ pitting edema up calves, shins. Pink coloration that was present at [place] has resolved. Neuro: Alert and oriented X 3. Moves all extremities spontaneously. Gait is normal. CNII-XII grossly in tact. Psych:  Responds to questions appropriately with a normal affect.     ASSESSMENT AND PLAN:  82 y.o. year old male with    Chronic diastolic heart failure ([place]) 06/20/2017: Despite increasing his Lasix, his lower extremity edema is not improved. At this time will increase Lasix dose to 40 mg pills from the 20 mg pills. I have reviewed this with him and even highlighted it for him on his AVS and told him to make sure he gets the new  pills and that his wife reviews this part of the AVS. He will take as directed. Potassium dose pill will stay at the 20 mEq but he will increase to taking 2 in the morning and 2 in the afternoon. Also reviewed this with him and highlighted on his AVS for him to  review with his wife when he gets home. Will check lab now to monitor. He is to return for follow-up visit in one week. Asked him to please bring his wife to that visit so I can verify that he is taking medication correctly. - furosemide (LASIX) 40 MG tablet; Take 2 tablets (80 mg total) by mouth 2 (two) times daily.  Dispense: 120 tablet; Refill: 2 - potassium chloride SA (K-DUR,KLOR-CON) 20 MEQ tablet; Take 2 in the morning, 2 in the afternoon.  Dispense: 120 tablet; Refill: 3 - BASIC METABOLIC PANEL WITH GFR  Bilateral lower extremity edema 06/20/2017:Despite increasing his Lasix, his lower extremity edema is not improved. At this time will increase Lasix dose to 40 mg pills from the 20 mg pills. I have reviewed this with him and even highlighted it for him on his AVS and told him to make sure he gets the new pills and that his wife reviews this part of the AVS. He will take as directed. Potassium dose pill will stay at the 20 mEq but he will increase to taking 2 in the morning and 2 in the afternoon. Also reviewed this with him and highlighted on his AVS for him to review with his wife when he gets home. Will check lab now to monitor. He is to return for follow-up visit in one week. Asked him to please bring his wife to that visit so I can verify that he is taking medication correctly. - furosemide (LASIX) 40 MG tablet; Take 2 tablets (80 mg total) by mouth 2 (two) times daily.  Dispense: 120 tablet; Refill: 2 - potassium chloride SA (K-DUR,KLOR-CON) 20 MEQ tablet; Take 2 in the morning, 2 in the afternoon.  Dispense: 120 tablet; Refill: 3 - BASIC METABOLIC PANEL WITH GFR   CKD (chronic kidney disease), stage III 06/20/2017:Will check lab now to monitor.    Uncontrolled type 2 diabetes mellitus with hyperglycemia, with long-term current use of insulin ([place]) 06/11/2017: In the past he had problems affording his insulin pens and I think that [facility] and other community staff have determined that  he needs to be on Novolin N for cost purposes. Will have him adjust this dose to 30 units twice daily. Will have him return in one week and bring his blood sugar meter. - BASIC METABOLIC PANEL WITH GFR 1/51/7616: Sugar is much improved since increasing to 30 units twice daily. Will continue this dose. He will bring his meter with him again when he returns for follow-up visit in one week. Also asked him to please bring his wife to that appointment in one week.  Non compliance w medication regimen 06/20/2017: Today I used highlighter to mark the changes in his Lasix and potassium and told him to make sure to show this to his wife. Also encouraged him to bring his wife with him to his follow-up visit in one week to help increase compliance and make sure he is taking medications correctly.  Cellulitis of right lower extremity 06/20/2017: This is resolved to today's visit.   Follow-up visit one week or sooner if needed.   Signed, [address] [place], [place], [facility] 06/20/2017 10:08 AM

## 2017-06-27 ENCOUNTER — Encounter: Payer: Self-pay | Admitting: Physician Assistant

## 2017-06-27 ENCOUNTER — Ambulatory Visit (INDEPENDENT_AMBULATORY_CARE_PROVIDER_SITE_OTHER): Payer: Medicare Other | Admitting: Physician Assistant

## 2017-06-27 VITALS — BP 136/78 | HR 73 | Temp 98.2°F | Resp 14 | Wt 186.6 lb

## 2017-06-27 DIAGNOSIS — Z794 Long term (current) use of insulin: Secondary | ICD-10-CM

## 2017-06-27 DIAGNOSIS — R600 Localized edema: Secondary | ICD-10-CM | POA: Insufficient documentation

## 2017-06-27 DIAGNOSIS — L03115 Cellulitis of right lower limb: Secondary | ICD-10-CM | POA: Diagnosis not present

## 2017-06-27 DIAGNOSIS — Z09 Encounter for follow-up examination after completed treatment for conditions other than malignant neoplasm: Secondary | ICD-10-CM

## 2017-06-27 DIAGNOSIS — I5032 Chronic diastolic (congestive) heart failure: Secondary | ICD-10-CM | POA: Diagnosis not present

## 2017-06-27 DIAGNOSIS — E1165 Type 2 diabetes mellitus with hyperglycemia: Secondary | ICD-10-CM | POA: Diagnosis not present

## 2017-06-27 DIAGNOSIS — J439 Emphysema, unspecified: Secondary | ICD-10-CM

## 2017-06-27 LAB — BASIC METABOLIC PANEL WITH GFR
BUN / CREAT RATIO: 20 (calc) (ref 6–22)
BUN: 26 mg/dL — AB (ref 7–25)
CALCIUM: 8.9 mg/dL (ref 8.6–10.3)
CO2: 30 mmol/L (ref 20–32)
Chloride: 94 mmol/L — ABNORMAL LOW (ref 98–110)
Creat: 1.28 mg/dL — ABNORMAL HIGH (ref 0.70–1.18)
GFR, EST NON AFRICAN AMERICAN: 53 mL/min/{1.73_m2} — AB (ref >=60)
GFR, Est African American: 61 mL/min/{1.73_m2} (ref >=60)
Glucose, Bld: 219 mg/dL — ABNORMAL HIGH (ref 65–99)
POTASSIUM: 5.2 mmol/L (ref 3.5–5.3)
Sodium: 133 mmol/L — ABNORMAL LOW (ref 135–146)

## 2017-06-27 MED ORDER — POTASSIUM CHLORIDE CRYS ER 20 MEQ PO TBCR: EXTENDED_RELEASE_TABLET | ORAL | 3 refills | Status: DC

## 2017-06-27 MED ORDER — PREDNISONE 10 MG PO TABS: 10.0000 mg | ORAL_TABLET | Freq: Every day | ORAL | 11 refills | Status: DC

## 2017-06-27 MED ORDER — FUROSEMIDE 40 MG PO TABS: 80.0000 mg | ORAL_TABLET | Freq: Three times a day (TID) | ORAL | 2 refills | Status: DC

## 2017-06-27 MED ORDER — CEPHALEXIN 500 MG PO CAPS: 500.0000 mg | ORAL_CAPSULE | Freq: Four times a day (QID) | ORAL | 0 refills | Status: AC

## 2017-06-27 NOTE — Progress Notes (Signed)
Patient ID: Edward [name] MRN: [id number], DOB: 1935-02-28, 82 y.o. Date of Encounter: @DATE @  Chief Complaint:  Chief Complaint  Patient presents with  . 1 week f/u    HPI: 82 y.o. year old male  Presents for f/u of LE edema and Uncontrolled diabetes.   06/11/2017: presents to f/u regarding LE edema and recent A1C.  His last office visit with me was just recently on 06/06/17. At the time of that visit he had been taking Lasix 20 mg tablets and was taking 2 every morning and then only on Mondays Wednesdays Fridays he would take an additional 2 on those afternoons.  On other days of the weeks he was only taking the 2 in the morning and no afternoon dose. At the time of that office visit I had him increase the Lasix 20 mg tablets to taking 3 every morning and taking 2 each day in early afternoon all days of the week.  Also for his potassium which is a 20 mEq tablet he was told to take 1 in the morning and 1 in the afternoon.  At that visit I also checked an A1c and this came back elevated at > 14.0 When  got that result, I planned for him to come back in for follow-up visit and bring his blood sugar meter so that I could review his blood sugar readings.   Today he reports that he has been taking the Lasix and the potassium as directed at last office visit, as documented above. Today he does bring in his blood sugar meter. Today when I  verified with him his current insulin dose-- he says that he was giving 20 twice a day but then changed to just doing 40 once a day.  Makes mention that " sometimes when he puts the needle in, the syringe looks empty so [he] doesn't even know how much medicine he is getting in" In the past I have him on once daily pens but I guess while he was at the hospital this got changed to needle and syringe and probably because of cost and past noncompliance because of cost he currently is on Novolin N..   AT THAT OV:  1. Chronic diastolic heart failure  ([place]) Check lab to monitor since we've been adjusting diuretic and potassium dose. Otherwise at this point we'll plan to continue current dosing of Lasix and potassium. His BUN has been high indicating dehydration. I'm concerned that most of his swelling at this point is because of his uncontrolled blood sugar. - BASIC METABOLIC PANEL WITH GFR  2. Uncontrolled type 2 diabetes mellitus with hyperglycemia, with long-term current use of insulin ([name]) In the past he had problems affording his insulin pens and I think that [facility] and other community staff have determined that he needs to be on Novolin N for cost purposes. Will have him adjust this dose to 30 units twice daily. Will have him return in one week and bring his blood sugar meter. - BASIC METABOLIC PANEL WITH GFR  3. Cellulitis of right lower extremity I am concerned that he is starting to develop some cellulitis on his right foot. Will have him add Keflex. F/U Visit 1 week. Follow-up sooner if this worsens. - cephALEXin (KEFLEX) 500 MG capsule; Take 1 capsule (500 mg total) by mouth 4 (four) times daily.  Dispense: 28 capsule; Refill: 0     06/20/2017: Regarding the insulin he says "I'm doing 30 ---2 times a day" without me giving  any prompts.  Therefore I do think that he is truly giving this amount. He did bring in his meter today and I have gone through daily readings on that. All readings are done at around 2 AM to 4 AM. Back prior to last visit those readings were 302, 484, 399, 394. Those were on the following dates: 06/10/17-------- 484    06/11/17--------399 06/12/2017-----394 06/13/2017-----149 06/14/2017-----292 9/14------------131 9/15----------- 133 9/16------------180 9/17--------------149 9/18 @ 2:05am----167   His wife is not with him at visit today. However he tells me that "she sits at the table right next to me when I take my medicine and makes sure I'm taking it right"  Says that he is taking his Lasix and  potassium as I had directed ---the Lasix has been 20 mg tablets taking 3 in the morning and 2 in the afternoon all days a week. His potassium is 20 mEq tablet and he has been taking 1 in the morning and 1 in the afternoon. Also he did take the Keflex as directed.  He has no additional concerns to address today. Is just here for follow-up.   A/P AT THAT OV:  06/20/2017:Despite increasing his Lasix, his lower extremity edema is not improved. At this time will increase Lasix dose to 40 mg pills from the 20 mg pills. I have reviewed this with him and even highlighted it for him on his AVS and told him to make sure he gets the new pills and that his wife reviews this part of the AVS. He will take as directed. Potassium dose pill will stay at the 20 mEq but he will increase to taking 2 in the morning and 2 in the afternoon. Also reviewed this with him and highlighted on his AVS for him to review with his wife when he gets home. Will check lab now to monitor. He is to return for follow-up visit in one week. Asked him to please bring his wife to that visit so I can verify that he is taking medication correctly. - furosemide (LASIX) 40 MG tablet; Take 2 tablets (80 mg total) by mouth 2 (two) times daily.  Dispense: 120 tablet; Refill: 2 - potassium chloride SA (K-DUR,KLOR-CON) 20 MEQ tablet; Take 2 in the morning, 2 in the afternoon.  Dispense: 120 tablet; Refill: 3 - BASIC METABOLIC PANEL WITH GFR  LAB FROM 06/20/2017: Notes recorded by Orlena Sheldon, PA-C on 06/21/2017 at 7:15 AM EDT At yesterday's visit, I told patient to return in one week and to bring his wife with him to that visit. Tell him to make sure that she comes with him to that visit. In the interim, take medications as planned at office visit. (FYI----given that his edema was even worse on physical exam yesterday and this BUN/Creatinine are normal compared to last labs 06/11/17, I really think that he took no Lasix, potassium between 9/10-9/19  or at least not as much as he was supposed to)    06/27/2017: Today patient's wife does accompany him for the visit. She states that she does make sure that he takes the medicines as directed "except that sometimes he has already gone in there and taken it before she gets in there."  Today patient does state that he is using the new increased dose of Lasix of the 40 mg tablet and that he is taking 2 of them 2 times daily. He reports this information without any prompting at all. Sounds like honest truth.  However, when I ask about the  potassium--- he says that he isn't sure/doesn't know--- isn't sure exactly how he has been taking that.  Says that the swelling is not any better however. No other concerns to address today. But when I do go to examine/listen to his lungs, he does comment that he is having some wheezing.    Past Medical History:  Diagnosis Date  . Allergy    Rhinitis  . Bronchitis   . Chronic respiratory failure ([place])   . Colon polyps   . COPD (chronic obstructive pulmonary disease) ([place])   . Diabetes mellitus   . Elevated lipids   . Hypercholesterolemia   . Hypertension   . On home O2    2L N/C   . PSA elevation   . Pulmonary fibrosis ([place])   . Vitamin D deficiency      Home Meds: Outpatient Medications Prior to Visit  Medication Sig Dispense Refill  . albuterol (PROVENTIL) (2.5 MG/3ML) 0.083% nebulizer solution INHALE 1 VIAL VIA NEBULIZER EVERY 6 HOURS AS NEEDED FOR WHEEZING OR SHORTNESS OF BREATH 360 mL 5  . baclofen (LIORESAL) 10 MG tablet Take 10 mg by mouth 3 (three) times daily.    . budesonide (PULMICORT) 0.5 MG/2ML nebulizer solution Take 2 mLs (0.5 mg total) by nebulization 2 (two) times daily. 120 mL 5  . diltiazem (CARDIZEM CD) 240 MG 24 hr capsule Take 1 capsule (240 mg total) by mouth daily. 30 capsule 3  . insulin NPH Human (NOVOLIN N) 100 UNIT/ML injection 30 units with breakfast daily and 30 units with supper daily 10 mL 1  . ipratropium  (ATROVENT) 0.02 % nebulizer solution Take 2.5 mLs (0.5 mg total) by nebulization 4 (four) times daily. 25 mL 12  . losartan (COZAAR) 100 MG tablet TAKE ONE TABLET BY MOUTH DAILY. 30 tablet 1  . metFORMIN (GLUCOPHAGE) 1000 MG tablet TAKE ONE TABLET BY MOUTH TWO TIMES DAILY. 60 tablet 1  . metoprolol tartrate (LOPRESSOR) 25 MG tablet Take 1 tablet (25 mg total) by mouth 2 (two) times daily. 60 tablet 1  . mirtazapine (REMERON) 30 MG tablet Take 1 tablet (30 mg total) by mouth at bedtime. 30 tablet 3  . omeprazole (PRILOSEC) 20 MG capsule Take 1 capsule (20 mg total) by mouth daily. 30 capsule 3  . ONE TOUCH ULTRA TEST test strip USE TO CHECK BLOOD SUGAR EACH MORNING AND THEN 2 HOURS AFTER ANY MEAL 100 each 2  . ONETOUCH DELICA LANCETS 60Y MISC USE TO CHECK BLOOD SUGAR TWICE DAILY AS DIRECTED 100 each 2  . pravastatin (PRAVACHOL) 80 MG tablet TAKE ONE TABLET BY MOUTH DAILY AT BEDTIME. 30 tablet 1  . predniSONE (DELTASONE) 10 MG tablet Take 5 tablets (50 mg total) by mouth daily with breakfast. And decrease by one tablet daily 15 tablet 0  . PROAIR RESPICLICK 301 (90 Base) MCG/ACT AEPB INHALE 2 PUFFS INTO LUNGS EVERY 6 HOURS AS NEEDED FOR WHEEZING OR SHORTNESS OF BREATH 1 each 5  . furosemide (LASIX) 40 MG tablet Take 2 tablets (80 mg total) by mouth 2 (two) times daily. 120 tablet 2  . potassium chloride SA (K-DUR,KLOR-CON) 20 MEQ tablet Take 2 in the morning, 2 in the afternoon. 120 tablet 3  . apixaban (ELIQUIS) 5 MG TABS tablet Take 1 tablet (5 mg total) by mouth 2 (two) times daily. 60 tablet 0   No facility-administered medications prior to visit.     Allergies:  Allergies  Allergen Reactions  . Ace Inhibitors Other (See Comments)  Hyperkalemia--07/23/2013:patient states not familiar with the following allergy    Social History   Social History  . Marital status: Married    Spouse name: N/A  . Number of children: N/A  . Years of education: N/A   Occupational History  . [employer]   . 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 Smoking status: Former Smoker    Packs/day: 1.50    Years: 60.00    Types: Cigarettes    Quit date: 12/31/2012  . Smokeless tobacco: Never Used  . Alcohol use No  . Drug use: No  . Sexual activity: Yes    Birth control/ protection: None   Other Topics Concern  . Not on file   Social History Narrative  . No narrative on file    Family History  Problem Relation Age of Onset  . Heart disease Mother   . CAD Other   . Diabetes Other      Review of Systems:  See HPI for pertinent ROS. All other ROS negative.    Physical Exam: Blood pressure 136/78, pulse 73, temperature 98.2 F (36.8 C), temperature source Oral, resp. rate 14, weight 84.6 kg (186 lb 9.6 oz), SpO2 93 %., Body mass index is 31.05 kg/m. General: Appears in no acute distress. Neck: Supple. No thyromegaly. No lymphadenopathy. Lungs: Distant/decreased breath sounds throughout. Mild - oderate wheeze throughout bilaterally. Heart: RRR with S1 S2. No murmurs, rubs, or gallops. Musculoskeletal:  Strength and tone normal for age. Extremities/Skin: 3+ - 4+ pitting edema up calves, shins. Diffuse Light Pink coloration present on dorsum of right foot---this was present at previous OV but then resolved---Has re-occurred now. Neuro: Alert and oriented X 3. Moves all extremities spontaneously. Gait is normal. CNII-XII grossly in tact. Psych:  Responds to questions appropriately with a normal affect.     ASSESSMENT AND PLAN:  82 y.o. year old male with    Chronic diastolic heart failure ([place]) 06/27/2017: Despite increasing his Lasix, his lower extremity edema is not improved. At this time will increase Lasix dose from 80 mg (2 of40mg ) BID to 80mg  (2 of 40mg ) TID. I have reviewed this with him and his wife both and even highlighted it on his AVS. Also will increase Potassium from taking 2 BID to taking 3 TID. (41meq tablets) . Will check lab now to monitor. He is to  return for follow-up visit in one week. Asked for his wife to accompany him to that [place] as well. They are agreeable.   - furosemide (LASIX) 40 MG tablet; Take 2 tablets (80 mg total) by mouth 3 (two) times each day - potassium chloride SA (K-DUR,KLOR-CON) 20 MEQ tablet; Take 2 tablets 3 times each day. - BASIC METABOLIC PANEL WITH GFR  Bilateral lower extremity edema 06/20/2017:Despite increasing his Lasix, his lower extremity edema is not improved. At this time will increase Lasix dose to 40 mg pills from the 20 mg pills. I have reviewed this with him and even highlighted it for him on his AVS and told him to make sure he gets the new pills and that his wife reviews this part of the AVS. He will take as directed. Potassium dose pill will stay at the 20 mEq but he will increase to taking 2 in the morning and 2 in the afternoon. Also reviewed this with him and highlighted on his AVS for him to review with his wife when he gets home. Will check lab now to monitor. He is  to return for follow-up visit in one week. Asked him to please bring his wife to that visit so I can verify that he is taking medication correctly. - furosemide (LASIX) 40 MG tablet; Take 2 tablets (80 mg total) by mouth 2 (two) times daily.  Dispense: 120 tablet; Refill: 2 - potassium chloride SA (K-DUR,KLOR-CON) 20 MEQ tablet; Take 2 in the morning, 2 in the afternoon.  Dispense: 120 tablet; Refill: 3 - BASIC METABOLIC PANEL WITH GFR  3/40/3524: Despite increasing his Lasix, his lower extremity edema is not improved. At this time will increase Lasix dose from 80 mg (2 of40mg ) BID to 80mg  (2 of 40mg ) TID. I have reviewed this with him and his wife both and even highlighted it on his AVS. Also will increase Potassium from taking 2 BID to taking 3 TID. (57meq tablets) . Will check lab now to monitor. He is to return for follow-up visit in one week. Asked for his wife to accompany him to that [place] as well. They are agreeable.   -  furosemide (LASIX) 40 MG tablet; Take 2 tablets (80 mg total) by mouth 3 (two) times each day - potassium chloride SA (K-DUR,KLOR-CON) 20 MEQ tablet; Take 2 tablets 3 times each day. - BASIC METABOLIC PANEL WITH GFR    CKD (chronic kidney disease), stage III 06/20/2017:Will check lab now to monitor. 06/27/2017: Check lab to monitor.   Uncontrolled type 2 diabetes mellitus with hyperglycemia, with long-term current use of insulin ([place]) 06/11/2017: In the past he had problems affording his insulin pens and I think that [facility] and other community staff have determined that he needs to be on Novolin N for cost purposes. Will have him adjust this dose to 30 units twice daily. Will have him return in one week and bring his blood sugar meter. - BASIC METABOLIC PANEL WITH GFR 05/19/5908: Sugar is much improved since increasing to 30 units twice daily. Will continue this dose. He will bring his meter with him again when he returns for follow-up visit in one week. Also asked him to please bring his wife to that appointment in one week. 06/27/2017: Continue the insulin at 30 units twice a day.  Non compliance w medication regimen 06/20/2017: Today I used highlighter to mark the changes in his Lasix and potassium and told him to make sure to show this to his wife. Also encouraged him to bring his wife with him to his follow-up visit in one week to help increase compliance and make sure he is taking medications correctly. 06/27/2017: Highlighted the new medications that they need to pick up at the pharmacy which are the prednisone 10 mg daily and the Keflex.  ----I also highlighted the dose change in the Lasix and potassium both of these are going from taking 2 tablets 2 times daily up to taking 2 tablets 3 times daily.  ----I reviewed the AVS and these changes with the patient and I reviewed them again directly with the wife. Cellulitis of right lower extremity 06/20/2017: This is resolved to today's  visit. 06/27/2017: The cellulitis has reoccurred. Will treat with another round of Keflex. If this worsens then he needs to follow-up immediately. Otherwise will follow this up in one week.   Pulmonary emphysema, unspecified emphysema type ([place]) He does have increased wheezing. Will add back prednisone 10 mg daily and he is to continue on this. - predniSONE (DELTASONE) 10 MG tablet; Take 1 tablet (10 mg total) by mouth daily.  Dispense: 30 tablet; Refill:  11     Follow-up visit one week or sooner if needed.   Signed, [address] [place], [place], [facility] 06/27/2017 1:33 PM

## 2017-06-28 ENCOUNTER — Other Ambulatory Visit: Payer: Self-pay | Admitting: Physician Assistant

## 2017-06-28 DIAGNOSIS — G4700 Insomnia, unspecified: Secondary | ICD-10-CM

## 2017-06-28 NOTE — Telephone Encounter (Signed)
Approved.  #[id number]. 

## 2017-06-28 NOTE — Telephone Encounter (Signed)
Last OV 06/27/2017 Last refill 03/21/2017 Ok to refill?

## 2017-06-28 NOTE — Telephone Encounter (Signed)
rx called in

## 2017-07-02 DIAGNOSIS — J449 Chronic obstructive pulmonary disease, unspecified: Secondary | ICD-10-CM | POA: Diagnosis not present

## 2017-07-04 ENCOUNTER — Encounter: Payer: Self-pay | Admitting: Physician Assistant

## 2017-07-04 ENCOUNTER — Ambulatory Visit (INDEPENDENT_AMBULATORY_CARE_PROVIDER_SITE_OTHER): Payer: Medicare Other | Admitting: Physician Assistant

## 2017-07-04 VITALS — BP 128/80 | HR 66 | Temp 98.0°F | Resp 22 | Ht 65.0 in | Wt 180.0 lb

## 2017-07-04 DIAGNOSIS — J8410 Pulmonary fibrosis, unspecified: Secondary | ICD-10-CM | POA: Diagnosis not present

## 2017-07-04 DIAGNOSIS — Z9114 Patient's other noncompliance with medication regimen: Secondary | ICD-10-CM

## 2017-07-04 DIAGNOSIS — I5032 Chronic diastolic (congestive) heart failure: Secondary | ICD-10-CM | POA: Diagnosis not present

## 2017-07-04 DIAGNOSIS — R600 Localized edema: Secondary | ICD-10-CM

## 2017-07-04 DIAGNOSIS — E1165 Type 2 diabetes mellitus with hyperglycemia: Secondary | ICD-10-CM | POA: Diagnosis not present

## 2017-07-04 DIAGNOSIS — J439 Emphysema, unspecified: Secondary | ICD-10-CM | POA: Diagnosis not present

## 2017-07-04 DIAGNOSIS — L03115 Cellulitis of right lower limb: Secondary | ICD-10-CM

## 2017-07-04 MED ORDER — SULFAMETHOXAZOLE-TRIMETHOPRIM 800-160 MG PO TABS: 1.0000 | ORAL_TABLET | Freq: Two times a day (BID) | ORAL | 0 refills | Status: DC

## 2017-07-04 MED ORDER — PREDNISONE 20 MG PO TABS: ORAL_TABLET | ORAL | 0 refills | Status: DC

## 2017-07-04 NOTE — Progress Notes (Signed)
Patient ID: Edward [name] MRN: [id number], DOB: September 21, 1935, 82 y.o. Date of Encounter: @DATE @  Chief Complaint:  Chief Complaint  Patient presents with  . Follow-up    is not fasting  . wheezing    x1 week- non-productive cough with inspirtional wheeze    HPI: 82 y.o. year old male  Presents for f/u of LE edema and Uncontrolled diabetes.   06/11/2017: presents to f/u regarding LE edema and recent A1C.  His last office visit with me was just recently on 06/06/17. At the time of that visit he had been taking Lasix 20 mg tablets and was taking 2 every morning and then only on Mondays Wednesdays Fridays he would take an additional 2 on those afternoons.  On other days of the weeks he was only taking the 2 in the morning and no afternoon dose. At the time of that office visit I had him increase the Lasix 20 mg tablets to taking 3 every morning and taking 2 each day in early afternoon all days of the week.  Also for his potassium which is a 20 mEq tablet he was told to take 1 in the morning and 1 in the afternoon.  At that visit I also checked an A1c and this came back elevated at > 14.0 When  got that result, I planned for him to come back in for follow-up visit and bring his blood sugar meter so that I could review his blood sugar readings.   Today he reports that he has been taking the Lasix and the potassium as directed at last office visit, as documented above. Today he does bring in his blood sugar meter. Today when I  verified with him his current insulin dose-- he says that he was giving 20 twice a day but then changed to just doing 40 once a day.  Makes mention that " sometimes when he puts the needle in, the syringe looks empty so [he] doesn't even know how much medicine he is getting in" In the past I have him on once daily pens but I guess while he was at the hospital this got changed to needle and syringe and probably because of cost and past noncompliance because of cost he  currently is on Novolin N..   AT THAT OV:  1. Chronic diastolic heart failure ([place]) Check lab to monitor since we've been adjusting diuretic and potassium dose. Otherwise at this point we'll plan to continue current dosing of Lasix and potassium. His BUN has been high indicating dehydration. I'm concerned that most of his swelling at this point is because of his uncontrolled blood sugar. - BASIC METABOLIC PANEL WITH GFR  2. Uncontrolled type 2 diabetes mellitus with hyperglycemia, with long-term current use of insulin ([name]) In the past he had problems affording his insulin pens and I think that [facility] and other community staff have determined that he needs to be on Novolin N for cost purposes. Will have him adjust this dose to 30 units twice daily. Will have him return in one week and bring his blood sugar meter. - BASIC METABOLIC PANEL WITH GFR  3. Cellulitis of right lower extremity I am concerned that he is starting to develop some cellulitis on his right foot. Will have him add Keflex. F/U Visit 1 week. Follow-up sooner if this worsens. - cephALEXin (KEFLEX) 500 MG capsule; Take 1 capsule (500 mg total) by mouth 4 (four) times daily.  Dispense: 28 capsule; Refill: 0  06/20/2017: Regarding the insulin he says "I'm doing 30 ---2 times a day" without me giving any prompts.  Therefore I do think that he is truly giving this amount. He did bring in his meter today and I have gone through daily readings on that. All readings are done at around 2 AM to 4 AM. Back prior to last visit those readings were 302, 484, 399, 394. Those were on the following dates: 06/10/17-------- 484    06/11/17--------399 06/12/2017-----394 06/13/2017-----149 06/14/2017-----292 9/14------------131 9/15----------- 133 9/16------------180 9/17--------------149 9/18 @ 2:05am----167   His wife is not with him at visit today. However he tells me that "she sits at the table right next to me when I take my medicine  and makes sure I'm taking it right"  Says that he is taking his Lasix and potassium as I had directed ---the Lasix has been 20 mg tablets taking 3 in the morning and 2 in the afternoon all days a week. His potassium is 20 mEq tablet and he has been taking 1 in the morning and 1 in the afternoon. Also he did take the Keflex as directed.  He has no additional concerns to address today. Is just here for follow-up.   A/P AT THAT OV:  06/20/2017:Despite increasing his Lasix, his lower extremity edema is not improved. At this time will increase Lasix dose to 40 mg pills from the 20 mg pills. I have reviewed this with him and even highlighted it for him on his AVS and told him to make sure he gets the new pills and that his wife reviews this part of the AVS. He will take as directed. Potassium dose pill will stay at the 20 mEq but he will increase to taking 2 in the morning and 2 in the afternoon. Also reviewed this with him and highlighted on his AVS for him to review with his wife when he gets home. Will check lab now to monitor. He is to return for follow-up visit in one week. Asked him to please bring his wife to that visit so I can verify that he is taking medication correctly. - furosemide (LASIX) 40 MG tablet; Take 2 tablets (80 mg total) by mouth 2 (two) times daily.  Dispense: 120 tablet; Refill: 2 - potassium chloride SA (K-DUR,KLOR-CON) 20 MEQ tablet; Take 2 in the morning, 2 in the afternoon.  Dispense: 120 tablet; Refill: 3 - BASIC METABOLIC PANEL WITH GFR  LAB FROM 06/20/2017: Notes recorded by Orlena Sheldon, PA-C on 06/21/2017 at 7:15 AM EDT At yesterday's visit, I told patient to return in one week and to bring his wife with him to that visit. Tell him to make sure that she comes with him to that visit. In the interim, take medications as planned at office visit. (FYI----given that his edema was even worse on physical exam yesterday and this BUN/Creatinine are normal compared to last labs  06/11/17, I really think that he took no Lasix, potassium between 9/10-9/19 or at least not as much as he was supposed to)    06/27/2017: Today patient's wife does accompany him for the visit. She states that she does make sure that he takes the medicines as directed "except that sometimes he has already gone in there and taken it before she gets in there."  Today patient does state that he is using the new increased dose of Lasix of the 40 mg tablet and that he is taking 2 of them 2 times daily. He reports this information  without any prompting at all. Sounds like honest truth.  However, when I ask about the potassium--- he says that he isn't sure/doesn't know--- isn't sure exactly how he has been taking that.  Says that the swelling is not any better however. No other concerns to address today. But when I do go to examine/listen to his lungs, he does comment that he is having some wheezing.  A/P AT THAT OV: 06/27/2017: Despite increasing his Lasix, his lower extremity edema is not improved. At this time will increase Lasix dose from 80 mg (2 of40mg ) BID to 80mg  (2 of 40mg ) TID. I have reviewed this with him and his wife both and even highlighted it on his AVS. Also will increase Potassium from taking 2 BID to taking 3 TID. (25meq tablets) . Will check lab now to monitor. He is to return for follow-up visit in one week. Asked for his wife to accompany him to that [place] as well. They are agreeable.   - furosemide (LASIX) 40 MG tablet; Take 2 tablets (80 mg total) by mouth 3 (two) times each day - potassium chloride SA (K-DUR,KLOR-CON) 20 MEQ tablet; Take 2 tablets 3 times each day. - BASIC METABOLIC PANEL WITH GFR  Uncontrolled type 2 diabetes mellitus with hyperglycemia, with long-term current use of insulin ([place]) 06/11/2017: In the past he had problems affording his insulin pens and I think that [facility] and other community staff have determined that he needs to be on Novolin N for cost purposes.  Will have him adjust this dose to 30 units twice daily. Will have him return in one week and bring his blood sugar meter. - BASIC METABOLIC PANEL WITH GFR 7/42/5956: Sugar is much improved since increasing to 30 units twice daily. Will continue this dose. He will bring his meter with him again when he returns for follow-up visit in one week. Also asked him to please bring his wife to that appointment in one week. 06/27/2017: Continue the insulin at 30 units twice a day.  Cellulitis of right lower extremity 06/20/2017: This is resolved to today's visit. 06/27/2017: The cellulitis has reoccurred. Will treat with another round of Keflex. If this worsens then he needs to follow-up immediately. Otherwise will follow this up in one week.   Pulmonary emphysema, unspecified emphysema type ([place]) He does have increased wheezing. Will add back prednisone 10 mg daily and he is to continue on this. - predniSONE (DELTASONE) 10 MG tablet; Take 1 tablet (10 mg total) by mouth daily.  Dispense: 30 tablet; Refill: 11  AT That OV I also highlighted med changes and reviewed them with pt and with wife.    07/04/2017:  Today patient does report that he can tell that he is having some increased wheezing/increased shortness of breath. Also we have noted that at his recent visits his oxygen saturation is reading low and he is not wearing his oxygen here. Discussed this with him today. He states he does wear his oxygen at home and is compliant with it at home but just does not wear it when he leaves the house. Today his wife is not with him for visit. Asked about this and he states that she had her own doctor's appointment today.    Past Medical History:  Diagnosis Date  . Allergy    Rhinitis  . Bronchitis   . Chronic respiratory failure ([place])   . Colon polyps   . COPD (chronic obstructive pulmonary disease) ([place])   . Diabetes mellitus   .  Elevated lipids   . Hypercholesterolemia   . Hypertension   . On home O2     2L N/C   . PSA elevation   . Pulmonary fibrosis ([place])   . Vitamin D deficiency      Home Meds: Outpatient Medications Prior to Visit  Medication Sig Dispense Refill  . albuterol (PROVENTIL) (2.5 MG/3ML) 0.083% nebulizer solution INHALE 1 VIAL VIA NEBULIZER EVERY 6 HOURS AS NEEDED FOR WHEEZING OR SHORTNESS OF BREATH 360 mL 5  . baclofen (LIORESAL) 10 MG tablet Take 10 mg by mouth 3 (three) times daily.    . budesonide (PULMICORT) 0.5 MG/2ML nebulizer solution Take 2 mLs (0.5 mg total) by nebulization 2 (two) times daily. 120 mL 5  . cephALEXin (KEFLEX) 500 MG capsule Take 1 capsule (500 mg total) by mouth 4 (four) times daily. 40 capsule 0  . diltiazem (CARDIZEM CD) 240 MG 24 hr capsule Take 1 capsule (240 mg total) by mouth daily. 30 capsule 3  . furosemide (LASIX) 40 MG tablet Take 2 tablets (80 mg total) by mouth 3 (three) times daily. 120 tablet 2  . insulin NPH Human (NOVOLIN N) 100 UNIT/ML injection 30 units with breakfast daily and 30 units with supper daily 10 mL 1  . ipratropium (ATROVENT) 0.02 % nebulizer solution Take 2.5 mLs (0.5 mg total) by nebulization 4 (four) times daily. 25 mL 12  . losartan (COZAAR) 100 MG tablet TAKE ONE TABLET BY MOUTH DAILY. 30 tablet 1  . metFORMIN (GLUCOPHAGE) 1000 MG tablet TAKE ONE TABLET BY MOUTH TWO TIMES DAILY. 60 tablet 1  . metoprolol tartrate (LOPRESSOR) 25 MG tablet Take 1 tablet (25 mg total) by mouth 2 (two) times daily. 60 tablet 1  . mirtazapine (REMERON) 30 MG tablet TAKE 1 TABLET BY MOUTH AT BEDTIME 30 tablet 5  . omeprazole (PRILOSEC) 20 MG capsule Take 1 capsule (20 mg total) by mouth daily. 30 capsule 3  . ONE TOUCH ULTRA TEST test strip USE TO CHECK BLOOD SUGAR EACH MORNING AND THEN 2 HOURS AFTER ANY MEAL 100 each 2  . ONETOUCH DELICA LANCETS 49F MISC USE TO CHECK BLOOD SUGAR TWICE DAILY AS DIRECTED 100 each 2  . potassium chloride SA (K-DUR,KLOR-CON) 20 MEQ tablet Take 2 tablets 3 times a day 120 tablet 3  . pravastatin  (PRAVACHOL) 80 MG tablet TAKE ONE TABLET BY MOUTH DAILY AT BEDTIME. 30 tablet 1  . predniSONE (DELTASONE) 10 MG tablet Take 5 tablets (50 mg total) by mouth daily with breakfast. And decrease by one tablet daily 15 tablet 0  . predniSONE (DELTASONE) 10 MG tablet Take 1 tablet (10 mg total) by mouth daily. 30 tablet 11  . PROAIR RESPICLICK 026 (90 Base) MCG/ACT AEPB INHALE 2 PUFFS INTO LUNGS EVERY 6 HOURS AS NEEDED FOR WHEEZING OR SHORTNESS OF BREATH 1 each 5  . apixaban (ELIQUIS) 5 MG TABS tablet Take 1 tablet (5 mg total) by mouth 2 (two) times daily. 60 tablet 0   No facility-administered medications prior to visit.     Allergies:  Allergies  Allergen Reactions  . Ace Inhibitors Other (See Comments)    Hyperkalemia--07/23/2013:patient states not familiar with the following allergy    Social History   Social History  . Marital status: Married    Spouse name: N/A  . Number of children: N/A  . Years of education: N/A   Occupational History  . 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  Smoking status: Former Smoker    Packs/day: 1.50    Years: 60.00    Types: Cigarettes    Quit date: 12/31/2012  . Smokeless tobacco: Never Used  . Alcohol use No  . Drug use: No  . Sexual activity: Yes    Birth control/ protection: None   Other Topics Concern  . Not on file   Social History Narrative  . No narrative on file    Family History  Problem Relation Age of Onset  . Heart disease Mother   . CAD Other   . Diabetes Other      Review of Systems:  See HPI for pertinent ROS. All other ROS negative.    Physical Exam: Blood pressure 128/80, pulse 66, temperature 98 F (36.7 C), temperature source Oral, resp. rate (!) 22, height 5\' 5"  (1.651 m), weight 81.6 kg (180 lb), SpO2 90 %., Body mass index is 29.95 kg/m. General: Appears in no acute distress. Neck: Supple. No thyromegaly. No lymphadenopathy. Lungs: Distant/decreased breath sounds throughout. Mild -  moderate wheeze throughout bilaterally. Heart: RRR with S1 S2. No murmurs, rubs, or gallops. Musculoskeletal:  Strength and tone normal for age. Extremities/Skin: 3+  pitting edema up calves, shins. However, size of lower legs/ankles is smaller today.  Diffuse Light Pink coloration present on distal 2/3 of right lower leg. Very minimum light pink coloration on dorsum of right foot.  Neuro: Alert and oriented X 3. Moves all extremities spontaneously. Gait is normal. CNII-XII grossly in tact. Psych:  Responds to questions appropriately with a normal affect.     ASSESSMENT AND PLAN:  82 y.o. year old male with   Chronic diastolic heart failure ([place]) Bilateral lower extremity edema  07/04/17--- I am not going to further change his diuretic and potassium dosing today. This overall size of his lower legs is much smaller. At next visit will consider adding Bumex on Monday Wednesday Friday or will consider changing Lasix to torsemide. Last BMET was stable so will not repeat lab today.  Cellulitis of leg, right He keeps having recurrent cellulitis of the right lower extremity. Had been using Keflex recently which does seem to improve but then reoccurs. Will treat with round of Bactrim now. Told him to monitor and if this worsens follow-up. - sulfamethoxazole-trimethoprim (BACTRIM DS,SEPTRA DS) 800-160 MG tablet; Take 1 tablet by mouth 2 (two) times daily.  Dispense: 20 tablet; Refill: 0     Pulmonary emphysema, unspecified emphysema type ([place]) He is not compliant with wearing his oxygen. At last visit did add low-dose daily prednisone and he says he is taking this. Today will given increased dose prednisone taper. Told him that if his breathing worsens despite this to follow-up immediately. Otherwise will have him follow-up in one week. - predniSONE (DELTASONE) 20 MG tablet; Take 3 daily for 2 days, then 2 daily for 2 days, then 1 daily for 2 days.  Dispense: 12 tablet; Refill: 0  Pulmonary  fibrosis ([place]) --See Above  Uncontrolled type 2 diabetes mellitus with hyperglycemia ([name]) Cont current dose of Insulin  Non compliance w medication regimen      Uncontrolled type 2 diabetes mellitus with hyperglycemia, with long-term current use of insulin ([place]) 06/11/2017: In the past he had problems affording his insulin pens and I think that [facility] and other community staff have determined that he needs to be on Novolin N for cost purposes. Will have him adjust this dose to 30 units twice daily. Will have him return in one week  and bring his blood sugar meter. - BASIC METABOLIC PANEL WITH GFR 2/51/8984: Sugar is much improved since increasing to 30 units twice daily. Will continue this dose. He will bring his meter with him again when he returns for follow-up visit in one week. Also asked him to please bring his wife to that appointment in one week. 06/27/2017: Continue the insulin at 30 units twice a day.      Follow-up visit one week or sooner if needed.   Signed, [address] [place], [place], [facility] 07/04/2017 9:16 AM

## 2017-07-06 ENCOUNTER — Other Ambulatory Visit: Payer: Self-pay | Admitting: Physician Assistant

## 2017-07-11 ENCOUNTER — Encounter: Payer: Self-pay | Admitting: Physician Assistant

## 2017-07-11 ENCOUNTER — Ambulatory Visit (INDEPENDENT_AMBULATORY_CARE_PROVIDER_SITE_OTHER): Payer: Medicare Other | Admitting: Physician Assistant

## 2017-07-11 ENCOUNTER — Other Ambulatory Visit: Payer: Self-pay | Admitting: *Deleted

## 2017-07-11 VITALS — BP 130/88 | HR 88 | Temp 98.1°F | Resp 24 | Ht 65.0 in | Wt 178.0 lb

## 2017-07-11 DIAGNOSIS — I5032 Chronic diastolic (congestive) heart failure: Secondary | ICD-10-CM | POA: Diagnosis not present

## 2017-07-11 DIAGNOSIS — Z9114 Patient's other noncompliance with medication regimen: Secondary | ICD-10-CM | POA: Diagnosis not present

## 2017-07-11 DIAGNOSIS — Z794 Long term (current) use of insulin: Secondary | ICD-10-CM

## 2017-07-11 DIAGNOSIS — J8410 Pulmonary fibrosis, unspecified: Secondary | ICD-10-CM | POA: Diagnosis not present

## 2017-07-11 DIAGNOSIS — E1165 Type 2 diabetes mellitus with hyperglycemia: Secondary | ICD-10-CM

## 2017-07-11 DIAGNOSIS — J439 Emphysema, unspecified: Secondary | ICD-10-CM | POA: Diagnosis not present

## 2017-07-11 DIAGNOSIS — K5900 Constipation, unspecified: Secondary | ICD-10-CM | POA: Diagnosis not present

## 2017-07-11 DIAGNOSIS — R600 Localized edema: Secondary | ICD-10-CM | POA: Diagnosis not present

## 2017-07-11 LAB — BASIC METABOLIC PANEL WITH GFR
BUN / CREAT RATIO: 25 (calc) — AB (ref 6–22)
BUN: 75 mg/dL — AB (ref 7–25)
CALCIUM: 9.7 mg/dL (ref 8.6–10.3)
CO2: 30 mmol/L (ref 20–32)
CREATININE: 3 mg/dL — AB (ref 0.70–1.11)
Chloride: 87 mmol/L — ABNORMAL LOW (ref 98–110)
GFR, EST AFRICAN AMERICAN: 22 mL/min/{1.73_m2} — AB (ref >=60)
GFR, Est Non African American: 19 mL/min/{1.73_m2} — ABNORMAL LOW (ref >=60)
Glucose, Bld: 356 mg/dL — ABNORMAL HIGH (ref 65–99)
Potassium: 7 mmol/L (ref 3.5–5.3)
Sodium: 128 mmol/L — ABNORMAL LOW (ref 135–146)

## 2017-07-11 MED ORDER — IPRATROPIUM BROMIDE 0.02 % IN SOLN: 0.5000 mg | Freq: Four times a day (QID) | RESPIRATORY_TRACT | 12 refills | Status: DC

## 2017-07-11 MED ORDER — BUDESONIDE 0.5 MG/2ML IN SUSP: 0.5000 mg | Freq: Two times a day (BID) | RESPIRATORY_TRACT | 5 refills | Status: DC

## 2017-07-11 NOTE — Progress Notes (Signed)
Patient ID: Edward [name] MRN: [id number], DOB: April 17, 1935, 82 y.o. Date of Encounter: @DATE @  Chief Complaint:  Chief Complaint  Patient presents with  . Follow-up    HPI: 82 y.o. year old male  presents for f/u of LE edema.   His wife does accompany him for visit today. See his last visit note 07/04/17. That note incorporates information from visit on 06/11/17, 06/20/17, 06/27/17, 07/04/17.  Today patient states that he thinks the swelling in his feet and legs is better. Today he reports that his breathing has been a little worse recently. Today he reports that he forgot and did not bring his blood sugar meter with him today. Today he does also asked about why his abdomen "is so fat ". He has no other specific concerns to address today.   Past Medical History:  Diagnosis Date  . Allergy    Rhinitis  . Bronchitis   . Chronic respiratory failure ([place])   . Colon polyps   . COPD (chronic obstructive pulmonary disease) ([place])   . Diabetes mellitus   . Elevated lipids   . Hypercholesterolemia   . Hypertension   . On home O2    2L N/C   . PSA elevation   . Pulmonary fibrosis ([place])   . Vitamin D deficiency      Home Meds: Outpatient Medications Prior to Visit  Medication Sig Dispense Refill  . albuterol (PROVENTIL) (2.5 MG/3ML) 0.083% nebulizer solution INHALE 1 VIAL VIA NEBULIZER EVERY 6 HOURS AS NEEDED FOR WHEEZING OR SHORTNESS OF BREATH 360 mL 5  . apixaban (ELIQUIS) 5 MG TABS tablet Take 1 tablet (5 mg total) by mouth 2 (two) times daily. 60 tablet 0  . budesonide (PULMICORT) 0.5 MG/2ML nebulizer solution Take 2 mLs (0.5 mg total) by nebulization 2 (two) times daily. 120 mL 5  . diltiazem (CARDIZEM CD) 240 MG 24 hr capsule Take 1 capsule (240 mg total) by mouth daily. 30 capsule 3  . furosemide (LASIX) 40 MG tablet Take 2 tablets (80 mg total) by mouth 3 (three) times daily. 120 tablet 2  . insulin NPH Human (NOVOLIN N) 100 UNIT/ML injection 30 units with breakfast daily  and 30 units with supper daily 10 mL 1  . ipratropium (ATROVENT) 0.02 % nebulizer solution Take 2.5 mLs (0.5 mg total) by nebulization 4 (four) times daily. 25 mL 12  . losartan (COZAAR) 100 MG tablet TAKE ONE TABLET BY MOUTH DAILY. 30 tablet 1  . metFORMIN (GLUCOPHAGE) 1000 MG tablet TAKE ONE TABLET BY MOUTH TWO TIMES DAILY. 60 tablet 1  . metoprolol tartrate (LOPRESSOR) 25 MG tablet Take 1 tablet (25 mg total) by mouth 2 (two) times daily. 60 tablet 1  . mirtazapine (REMERON) 30 MG tablet TAKE 1 TABLET BY MOUTH AT BEDTIME 30 tablet 5  . omeprazole (PRILOSEC) 20 MG capsule Take 1 capsule (20 mg total) by mouth daily. 30 capsule 3  . ONE TOUCH ULTRA TEST test strip USE TO CHECK BLOOD SUGAR EACH MORNING AND THEN 2 HOURS AFTER ANY MEAL 100 each 2  . ONETOUCH DELICA LANCETS 75Z MISC USE TO CHECK BLOOD SUGAR TWICE DAILY AS DIRECTED 100 each 2  . potassium chloride SA (K-DUR,KLOR-CON) 20 MEQ tablet Take 2 tablets 3 times a day 120 tablet 3  . pravastatin (PRAVACHOL) 80 MG tablet TAKE ONE TABLET BY MOUTH DAILY AT BEDTIME. 30 tablet 1  . predniSONE (DELTASONE) 10 MG tablet Take 1 tablet (10 mg total) by mouth daily. 30 tablet  11  . baclofen (LIORESAL) 10 MG tablet Take 10 mg by mouth 3 (three) times daily.    Marland Kitchen sulfamethoxazole-trimethoprim (BACTRIM DS,SEPTRA DS) 800-160 MG tablet Take 1 tablet by mouth 2 (two) times daily. 20 tablet 0  . predniSONE (DELTASONE) 10 MG tablet Take 5 tablets (50 mg total) by mouth daily with breakfast. And decrease by one tablet daily (Patient not taking: Reported on 07/11/2017) 15 tablet 0  . predniSONE (DELTASONE) 20 MG tablet Take 3 daily for 2 days, then 2 daily for 2 days, then 1 daily for 2 days. (Patient not taking: Reported on 07/11/2017) 12 tablet 0  . PROAIR RESPICLICK 009 (90 Base) MCG/ACT AEPB INHALE 2 PUFFS INTO LUNGS EVERY 6 HOURS AS NEEDED FOR WHEEZING OR SHORTNESS OF BREATH (Patient not taking: Reported on 07/11/2017) 1 each 5   No facility-administered  medications prior to visit.     Allergies:  Allergies  Allergen Reactions  . Ace Inhibitors Other (See Comments)    Hyperkalemia--07/23/2013:patient states not familiar with the following allergy    Social History   Social History  . Marital status: Married    Spouse name: N/A  . Number of children: N/A  . Years of education: N/A   Occupational History  . 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 Smoking status: Former Smoker    Packs/day: 1.50    Years: 60.00    Types: Cigarettes    Quit date: 12/31/2012  . Smokeless tobacco: Never Used  . Alcohol use No  . Drug use: No  . Sexual activity: Yes    Birth control/ protection: None   Other Topics Concern  . Not on file   Social History Narrative  . No narrative on file    Family History  Problem Relation Age of Onset  . Heart disease Mother   . CAD Other   . Diabetes Other      Review of Systems:  See HPI for pertinent ROS. All other ROS negative.    Physical Exam: Blood pressure 130/88, pulse 88, temperature 98.1 F (36.7 C), temperature source Oral, resp. rate (!) 24, height 5\' 5"  (1.651 m), weight 80.7 kg (178 lb), SpO2 90 %., Body mass index is 29.62 kg/m. General: WM. Appears in no acute distress. Neck: Supple. No thyromegaly. No lymphadenopathy. Lungs: Distant, decreased breath sounds throughout. However I hear no wheezes rhonchi or rales on exam today. Heart: RRR with S1 S2. No murmurs, rubs, or gallops. Abdomen: Soft, non-tender, non-distended with normoactive bowel sounds. No hepatomegaly. No rebound/guarding. No obvious abdominal masses. Musculoskeletal:  Strength and tone normal for age. Extremities/Skin: Warm and dry. He has NO lower extremity edema on exam today. Coloration is also normal. There is no light pink color/cellulitis on exam today. Neuro: Alert and oriented X 3. Moves all extremities spontaneously. Gait is normal. CNII-XII grossly in tact. Psych:  Responds to  questions appropriately with a normal affect.     ASSESSMENT AND PLAN:  82 y.o. year old male with  1. Chronic diastolic CHF (congestive heart failure) ([name]) We'll continue current dose of Lasix and potassium. We'll check lab to monitor. - BASIC METABOLIC PANEL WITH GFR  2. Pulmonary emphysema, unspecified emphysema type ([place]) I reviewed that I gave him a prednisone taper at last visit. He states that he did take that as directed. I asked him if he is using the nebulizer machine at home. After discussion, learned that he is not using  the nebulizer machine at all. Reviewed with him that he is supposed to be using the budesonide/Pulmicort twice a day every day. Reviewed with him that he is supposed to be using the ipratropium/Atrovent 4 times a day all days. At recent visit we had also discussed with him and he is supposed to be using nasal cannula oxygen all the time these recent visits he has been wearing no oxygen and his oxygen saturation has been reading low. At those visits he told me he is wearing the oxygen at home but just not when he goes out. Now finding out that he also is not using the nebulized medicines at all. Day marked his AVS with the proper instructions for these and discussed it with both patient and wife both individually.  3. Pulmonary fibrosis ([place])  4. Uncontrolled type 2 diabetes mellitus with hyperglycemia, with long-term current use of insulin ([place]) Needs to bring his blood sugar meter with him to next visit so I can review his blood sugar readings.  5. Non compliance w medication regimen   6. Constipation, unspecified constipation type Today I reviewed with him that the KUB performed in June showed findings consistent with constipation. I asked if he is using any medicine for constipation. He says that he uses Metamucil. Wife says that she drinks the Metamucil with a glass of water 3 times a day the patient states he only uses it once a day. He is going to increase  use of Metamucil fruits vegetables and water and will follow this up.  7. Bilateral lower extremity edema We'll continue current dose of Lasix and potassium and will check lab to monitor. - BASIC METABOLIC PANEL WITH GFR  Since the cellulitis is resolved and his edema is resolved/controlled will wait 2 weeks for follow-up visit. Follow-up sooner if needed.  [address] [place], [place], [facility] 07/11/2017 12:02 PM

## 2017-07-11 NOTE — Telephone Encounter (Signed)
Received call from patient wife, [name].   Reports that patient only has Albuterol 2.5mg  nebulizer.   Prescription sent to pharmacy for Atrovent and Pulmicort. Re-educated that these medications are to be taken every day as directed to help with breathing.

## 2017-07-12 NOTE — Addendum Note (Signed)
Addended by: Dena Billet B on: 07/12/2017 07:51 AM   Modules accepted: Orders

## 2017-07-13 ENCOUNTER — Other Ambulatory Visit: Payer: Medicare Other

## 2017-07-17 ENCOUNTER — Other Ambulatory Visit: Payer: Medicare Other

## 2017-07-17 ENCOUNTER — Other Ambulatory Visit: Payer: Self-pay | Admitting: Physician Assistant

## 2017-07-17 LAB — BASIC METABOLIC PANEL WITH GFR
BUN/Creatinine Ratio: 22 (calc) (ref 6–22)
BUN: 57 mg/dL — ABNORMAL HIGH (ref 7–25)
CO2: 31 mmol/L (ref 20–32)
CREATININE: 2.56 mg/dL — AB (ref 0.70–1.11)
Calcium: 9.5 mg/dL (ref 8.6–10.3)
Chloride: 88 mmol/L — ABNORMAL LOW (ref 98–110)
GFR, EST NON AFRICAN AMERICAN: 23 mL/min/{1.73_m2} — AB (ref >=60)
GFR, Est African American: 26 mL/min/{1.73_m2} — ABNORMAL LOW (ref >=60)
Glucose, Bld: 293 mg/dL — ABNORMAL HIGH (ref 65–99)
Potassium: 5.2 mmol/L (ref 3.5–5.3)
SODIUM: 127 mmol/L — AB (ref 135–146)

## 2017-07-19 ENCOUNTER — Ambulatory Visit (INDEPENDENT_AMBULATORY_CARE_PROVIDER_SITE_OTHER): Payer: Medicare Other | Admitting: Physician Assistant

## 2017-07-19 ENCOUNTER — Encounter: Payer: Self-pay | Admitting: Physician Assistant

## 2017-07-19 VITALS — BP 150/82 | HR 87 | Temp 97.6°F | Resp 18 | Ht 65.0 in | Wt 176.2 lb

## 2017-07-19 DIAGNOSIS — Z09 Encounter for follow-up examination after completed treatment for conditions other than malignant neoplasm: Secondary | ICD-10-CM | POA: Diagnosis not present

## 2017-07-19 DIAGNOSIS — E785 Hyperlipidemia, unspecified: Secondary | ICD-10-CM

## 2017-07-19 DIAGNOSIS — I10 Essential (primary) hypertension: Secondary | ICD-10-CM

## 2017-07-19 DIAGNOSIS — I5032 Chronic diastolic (congestive) heart failure: Secondary | ICD-10-CM | POA: Diagnosis not present

## 2017-07-19 DIAGNOSIS — Z91199 Patient's noncompliance with other medical treatment and regimen due to unspecified reason: Secondary | ICD-10-CM

## 2017-07-19 DIAGNOSIS — R600 Localized edema: Secondary | ICD-10-CM | POA: Diagnosis not present

## 2017-07-19 DIAGNOSIS — J439 Emphysema, unspecified: Secondary | ICD-10-CM | POA: Diagnosis not present

## 2017-07-19 DIAGNOSIS — J8410 Pulmonary fibrosis, unspecified: Secondary | ICD-10-CM | POA: Diagnosis not present

## 2017-07-19 DIAGNOSIS — Z9119 Patient's noncompliance with other medical treatment and regimen: Secondary | ICD-10-CM

## 2017-07-19 LAB — BASIC METABOLIC PANEL WITH GFR
BUN/Creatinine Ratio: 24 (calc) — ABNORMAL HIGH (ref 6–22)
BUN: 40 mg/dL — ABNORMAL HIGH (ref 7–25)
CALCIUM: 10.2 mg/dL (ref 8.6–10.3)
CHLORIDE: 91 mmol/L — AB (ref 98–110)
CO2: 29 mmol/L (ref 20–32)
Creat: 1.65 mg/dL — ABNORMAL HIGH (ref 0.70–1.11)
GFR, EST NON AFRICAN AMERICAN: 39 mL/min/{1.73_m2} — AB (ref >=60)
GFR, Est African American: 45 mL/min/{1.73_m2} — ABNORMAL LOW (ref >=60)
GLUCOSE: 114 mg/dL — AB (ref 65–99)
POTASSIUM: 5.2 mmol/L (ref 3.5–5.3)
SODIUM: 129 mmol/L — AB (ref 135–146)

## 2017-07-19 MED ORDER — METFORMIN HCL 1000 MG PO TABS: 1000.0000 mg | ORAL_TABLET | Freq: Two times a day (BID) | ORAL | 1 refills | Status: DC

## 2017-07-19 MED ORDER — ALBUTEROL SULFATE (2.5 MG/3ML) 0.083% IN NEBU: INHALATION_SOLUTION | RESPIRATORY_TRACT | 5 refills | Status: DC

## 2017-07-19 MED ORDER — METOPROLOL TARTRATE 25 MG PO TABS: 25.0000 mg | ORAL_TABLET | Freq: Two times a day (BID) | ORAL | 1 refills | Status: DC

## 2017-07-19 MED ORDER — FUROSEMIDE 40 MG PO TABS: 80.0000 mg | ORAL_TABLET | Freq: Three times a day (TID) | ORAL | 2 refills | Status: DC

## 2017-07-19 MED ORDER — POTASSIUM CHLORIDE CRYS ER 20 MEQ PO TBCR: EXTENDED_RELEASE_TABLET | ORAL | 3 refills | Status: DC

## 2017-07-19 MED ORDER — BUDESONIDE 0.5 MG/2ML IN SUSP: 0.5000 mg | Freq: Two times a day (BID) | RESPIRATORY_TRACT | 5 refills | Status: DC

## 2017-07-19 MED ORDER — LOSARTAN POTASSIUM 100 MG PO TABS: 100.0000 mg | ORAL_TABLET | Freq: Every day | ORAL | 1 refills | Status: DC

## 2017-07-19 MED ORDER — PRAVASTATIN SODIUM 80 MG PO TABS: 80.0000 mg | ORAL_TABLET | Freq: Every day | ORAL | 1 refills | Status: DC

## 2017-07-19 MED ORDER — IPRATROPIUM BROMIDE 0.02 % IN SOLN: 0.5000 mg | Freq: Four times a day (QID) | RESPIRATORY_TRACT | 12 refills | Status: DC

## 2017-07-19 MED ORDER — APIXABAN 5 MG PO TABS: 5.0000 mg | ORAL_TABLET | Freq: Two times a day (BID) | ORAL | 0 refills | Status: DC

## 2017-07-19 MED ORDER — DILTIAZEM HCL ER COATED BEADS 240 MG PO CP24: 240.0000 mg | ORAL_CAPSULE | Freq: Every day | ORAL | 3 refills | Status: DC

## 2017-07-19 MED ORDER — INSULIN NPH (HUMAN) (ISOPHANE) 100 UNIT/ML ~~LOC~~ SUSP: SUBCUTANEOUS | 1 refills | Status: DC

## 2017-07-19 NOTE — Patient Instructions (Signed)
I reached out verbally to [place] so we can get this patient on regular and correct medication management and disease management and monitoring.
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
## 2017-07-20 ENCOUNTER — Telehealth: Payer: Self-pay | Admitting: *Deleted

## 2017-07-20 ENCOUNTER — Other Ambulatory Visit: Payer: Self-pay | Admitting: *Deleted

## 2017-07-20 NOTE — Patient Outreach (Signed)
[facility]) Care Management  07/20/2017  POPE BRUNTY [birth year] [id number]   Care coordination  [facility] CM received referral from [place], L Manning to [facility] for complex case      management. Patient has uncontrolled DM & knowledge deficit regarding      medical regimen. Please see notes. Patient has agreed to [facility] care      management services. Has also been referred to pharmacy as needed for      medication management (MD office referred patient to [place] also). [facility] CM notes pt with 2 admissions and ED visits in the last 6 months [facility] CM reviewed the referral from the telephonic nurse with Mr [place] Mr Sellick reports he sees m dixon on Wednesday for follow up appointment and prefers to be seen on Friday 07/27/17 in the am  Plan [facility] CM scheduled to see Mr Doren Custard next week for initial home visit  [place]. [name], RN, BSN, [place] Care Management [phone]

## 2017-07-20 NOTE — Patient Outreach (Signed)
[place] [facility]) Care Management  07/20/2017  Aurelius TRYONE KILLE 1935-01-13 [id number]  Received referral from [place] for [facility] care management services; disease management for uncontrolled diabetes; noncompliance with medication regimen.  Subjective/Objective:  Telephone call to patient who was advised of reason for call & [facility] care management services.  Patient gave HIPPA verification for name & date of birth (needed assistance from spouse to give street address (patient initially offered [address] address).  Patient consented to complete screening assessment.  Patient states she has no major health concerns.  Patient questioned as to whether he has diabetes & COPD & he voiced yes.   States he checks blood sugar once a day before eating first meal. States reading was greater than 200 this am. States previous day it was 130.  Patient stated that he takes metformin, insulin, cholesterol medication for diabetes. (it is noted in chart that most recent A1c level was greater than 14 in Sept 2018) Patient states he is not following a special diet for diabetes & is not aware of what A1c means.  States is or his spouse prepare meals.    Patient voices that he prepares his own medications and does not use pill box. States his medications are delivered to his home. (it is noted in MD progress notes that office arranged for [facility] to deliver patient medication to home & check monthly to make sure patient is taking meds as directed).  Patient states he completed 7th grad & sometimes does need assistance with reading & understanding directions. States spouse does assist.   Patient states he feels dizzy when blood sugar is high,  however no complaints of dizziness today. Patient was not able to state what symptoms of low blood sugar were or how to counteract. Patient was advised of way to counteract low blood sugar.   Patient states he gets around well & does  not require assistive devices. States he has not had falls this year. States his son or spouse take him to MD appointments.   Patient voices yes when asked if he has COPD. States he stopped smoking 5-6 years ago. States he uses oxygen 3-4 times a day. States he does not measure oxygen level.   Patient consents to [facility] care management services.   Assessment: Patient has knowledge deficit regarding self management of chronic disease state. Diabetes is not controlled with A1c of greater than 14 Patient not adherent to medical regime. Patient not actively listening & sometimes answers same question differently if  question is repeated.  Has support from spouse & son. Recommendation for [facility] community case management services.    Plan: Refer to assign Community care coordinator for complex case management. Refer to Pharmacy for medication management if needed.  Sherrin Daisy, RN BSN [name] Management Coordinator [facility] Care Management  [phone]

## 2017-07-23 ENCOUNTER — Telehealth: Payer: Self-pay

## 2017-07-23 ENCOUNTER — Telehealth: Payer: Self-pay | Admitting: *Deleted

## 2017-07-23 DIAGNOSIS — I5032 Chronic diastolic (congestive) heart failure: Secondary | ICD-10-CM

## 2017-07-23 DIAGNOSIS — R600 Localized edema: Secondary | ICD-10-CM

## 2017-07-23 DIAGNOSIS — Z09 Encounter for follow-up examination after completed treatment for conditions other than malignant neoplasm: Secondary | ICD-10-CM

## 2017-07-23 MED ORDER — POTASSIUM CHLORIDE CRYS ER 20 MEQ PO TBCR: EXTENDED_RELEASE_TABLET | ORAL | 3 refills | Status: DC

## 2017-07-23 MED ORDER — INSULIN ISOPHANE HUMAN 100 UNIT/ML KWIKPEN: PEN_INJECTOR | SUBCUTANEOUS | 11 refills | Status: DC

## 2017-07-23 NOTE — Patient Outreach (Signed)
[facility] [facility]) Care Management  07/23/2017  Edward [name] March 25, 1935 [id number]   care coordination   [facility] CM collaborated with [facility] pharmacist Lennette Bihari about insulin concerns and DM home care Pt previously seen by [facility] CM/pharmacist with refusal to apply for resources available to him  Plans [facility] CM to see [name] this week for home visit as agreed  Joelene Millin L. [name], RN, BSN, [place] Care Management [phone]

## 2017-07-23 NOTE — Telephone Encounter (Signed)
[place] had called and stated  patient potassium rx was not for 30 days and that novolin needed to be changed to humulin per insurance preference Per provider she verbalized ok to make change to insulin

## 2017-07-25 ENCOUNTER — Ambulatory Visit (INDEPENDENT_AMBULATORY_CARE_PROVIDER_SITE_OTHER): Payer: Medicare Other | Admitting: Physician Assistant

## 2017-07-25 ENCOUNTER — Encounter: Payer: Self-pay | Admitting: Physician Assistant

## 2017-07-25 VITALS — BP 152/84 | HR 82 | Temp 97.6°F | Resp 18 | Ht 65.0 in | Wt 182.2 lb

## 2017-07-25 DIAGNOSIS — E1165 Type 2 diabetes mellitus with hyperglycemia: Secondary | ICD-10-CM

## 2017-07-25 DIAGNOSIS — I5032 Chronic diastolic (congestive) heart failure: Secondary | ICD-10-CM | POA: Diagnosis not present

## 2017-07-25 DIAGNOSIS — Z794 Long term (current) use of insulin: Secondary | ICD-10-CM

## 2017-07-25 DIAGNOSIS — Z9114 Patient's other noncompliance with medication regimen: Secondary | ICD-10-CM | POA: Diagnosis not present

## 2017-07-25 DIAGNOSIS — J439 Emphysema, unspecified: Secondary | ICD-10-CM | POA: Diagnosis not present

## 2017-07-25 DIAGNOSIS — N183 Chronic kidney disease, stage 3 unspecified: Secondary | ICD-10-CM

## 2017-07-25 DIAGNOSIS — E785 Hyperlipidemia, unspecified: Secondary | ICD-10-CM | POA: Diagnosis not present

## 2017-07-25 DIAGNOSIS — R600 Localized edema: Secondary | ICD-10-CM

## 2017-07-25 DIAGNOSIS — Z91148 Patient's other noncompliance with medication regimen for other reason: Secondary | ICD-10-CM
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
## 2017-07-26 ENCOUNTER — Telehealth: Payer: Self-pay

## 2017-07-26 NOTE — Telephone Encounter (Signed)
Claiborne Billings called and left a message stating she  spoke with patient son and was told that patient's wife helps with medication Claiborne Billings has some concerns and was wondering if patient would qualify for a  Home health nurse to come in once or twice a week to help fill pill packs  I called Claiborne Billings and informed her that [facility] has been in contact with patient. Claiborne Billings then stated that patient had not been taking his eliquis due to cost.. According to [facility] patient has a deduc to meet I faxed over a copy of a eliquis savings card to [place] she will check and see if patient can get rx at a discounted price or she will call back to see if patient can get samples

## 2017-07-27 ENCOUNTER — Other Ambulatory Visit: Payer: Self-pay | Admitting: *Deleted

## 2017-07-27 ENCOUNTER — Telehealth: Payer: Self-pay

## 2017-07-27 NOTE — Patient Outreach (Signed)
[facility]) Care Management   07/27/2017  Edward [name] 1935/04/15 [id number]  Edward [name] is an 82 y.o. male   Edward [name] had been previously followed by [facility] CM services in 2017 and the last contact was made in January 2018.  His [facility] consent form noted in EPIC.  He was referred to this [facility] CM from [facility] telephonic RN, Halford Decamp  EPIC indicates 2 admissions in the last 6 months, both in June 2018.  Subjective:   Objective:  BP (!) 160/78   Pulse 94   Temp 97.7 F (36.5 C) (Oral)   Resp 20   SpO2 93%    Review of Systems  Constitutional: Negative for chills, diaphoresis, fever, malaise/fatigue and weight loss.  HENT: Negative.   Eyes: Negative.   Respiratory: Positive for cough.   Cardiovascular: Positive for leg swelling.  Gastrointestinal: Negative.   Musculoskeletal: Positive for back pain.       Back hurt when gets up in mornings but "eases off"  Skin: Negative.   Neurological: Positive for weakness.  Endo/Heme/Allergies: Bruises/bleeds easily.    Physical Exam  Constitutional: He is oriented to person, place, and time. He appears well-developed and well-nourished.  HENT:  Head: Normocephalic and atraumatic.  Eyes: Conjunctivae are normal. Pupils are equal, round, and reactive to light.  Neck: Normal range of motion. Neck supple.  Cardiovascular: Normal rate and regular rhythm.  Respiratory: Effort normal and breath sounds normal.  GI: Soft. Bowel sounds are normal.  Musculoskeletal: Normal range of motion.  Neurological: He is alert and oriented to person, place, and time.  Skin: Skin is warm and dry.  Psychiatric: He has a normal mood and affect. His behavior is normal. Judgment and thought content normal.    Encounter Medications:   Outpatient Encounter Prescriptions as of 07/27/2017  Medication Sig  . albuterol (PROVENTIL) (2.5 MG/3ML) 0.083% nebulizer solution INHALE 1 VIAL VIA NEBULIZER EVERY 6 HOURS AS NEEDED FOR WHEEZING OR  SHORTNESS OF BREATH  . apixaban (ELIQUIS) 5 MG TABS tablet Take 1 tablet (5 mg total) by mouth 2 (two) times daily.  . baclofen (LIORESAL) 10 MG tablet Take 10 mg by mouth 3 (three) times daily.  . budesonide (PULMICORT) 0.5 MG/2ML nebulizer solution Take 2 mLs (0.5 mg total) by nebulization 2 (two) times daily.  Marland Kitchen diltiazem (CARDIZEM CD) 240 MG 24 hr capsule Take 1 capsule (240 mg total) by mouth daily.  . furosemide (LASIX) 40 MG tablet Take 2 tablets (80 mg total) by mouth 3 (three) times daily.  . Insulin NPH, Human,, Isophane, (HUMULIN N) 100 UNIT/ML Kiwkpen Inject 30 units at breakfast inject 30 units at supper time.  Marland Kitchen ipratropium (ATROVENT) 0.02 % nebulizer solution Take 2.5 mLs (0.5 mg total) by nebulization 4 (four) times daily.  Marland Kitchen losartan (COZAAR) 100 MG tablet Take 1 tablet (100 mg total) by mouth daily.  . metFORMIN (GLUCOPHAGE) 1000 MG tablet Take 1 tablet (1,000 mg total) by mouth 2 (two) times daily.  . metoprolol tartrate (LOPRESSOR) 25 MG tablet Take 1 tablet (25 mg total) by mouth 2 (two) times daily.  . mirtazapine (REMERON) 30 MG tablet TAKE 1 TABLET BY MOUTH AT BEDTIME  . omeprazole (PRILOSEC) 20 MG capsule Take 1 capsule (20 mg total) by mouth daily.  . ONE TOUCH ULTRA TEST test strip USE TO CHECK BLOOD SUGAR EACH MORNING AND THEN 2 HOURS AFTER ANY MEAL  . ONETOUCH DELICA LANCETS 62B MISC USE TO CHECK BLOOD SUGAR TWICE DAILY  AS DIRECTED  . potassium chloride SA (K-DUR,KLOR-CON) 20 MEQ tablet Take 2 tablets 3 times a day  . pravastatin (PRAVACHOL) 80 MG tablet Take 1 tablet (80 mg total) by mouth at bedtime.   No facility-administered encounter medications on file as of 07/27/2017.     Functional Status:   In your present state of health, do you have any difficulty performing the following activities: 03/04/2017 02/25/2017  Hearing? N N  Vision? N N  Difficulty concentrating or making decisions? Y N  Walking or climbing stairs? Y Y  Dressing or bathing? Y N  Doing  errands, shopping? N N  Preparing Food and eating ? - -  Using the Toilet? - -  In the past six months, have you accidently leaked urine? - -  Do you have problems with loss of bowel control? - -  Managing your Medications? - -  Managing your Finances? - -  Housekeeping or managing your Housekeeping? - -  Some recent data might be hidden    Fall/Depression Screening:    Fall Risk  07/20/2017 06/20/2017 07/27/2016  Falls in the past year? No No No   PHQ 2/9 Scores 07/20/2017 06/20/2017 07/27/2016 03/16/2016 02/09/2015 12/01/2013  PHQ - 2 Score 0 0 0 0 1 0  PHQ- 9 Score - 0 - - - -    Assessment:   [facility] CM met with Edward [name] at his home with his wife at the dining room table  His son, Edward [name] was also present in the living room  Edward Mccree confirmed with [facility] CM that he is no longer followed by hospice   He confirms his insurance coverage as [employer]ARP united health care medicare complete plan 2 (HMO)  He confirms Dena Billet PA is the only provider he is seeing. Reports Dixon had been seeing him "weekly", "now I will not have to see her until December" No CV or endocrinologist being seen per patient Today he informs [facility] CM that his health is "poor"  DME Has 2 glucometers- one touch ultra 2, only one works, one has a test strip stuck in it.  Operating glucometer has been available for 2 years.   No uhc catalog but one ordered during home visit. Educated on the OTC benefit program via [facility]. Has eyeglasses but states he does not wear them No dentures per pt   CHF Reviewed CHF Action plan in the [facility] calendar provided  Diet Mrs Avitabile encouraged Edward Marucci to ask CM about his "three egg yolks a day" Edward Pizano states he has "grown up all my life eating yolks. I don't like the whites" CM encouraged him to change this dietary habit by decreasing the intake and explained his health changes related to a "[employer]" automobile CAR [place]   Medications During the CM visit Edward Clinger was  contacted to receive assistance with his Eliquis He as informed that there are samples for him at his provider's office   Smoking Reports he is a former smoker and "stopped ten years ago"  CM noted an ash tray on the front and back porches prior to coming in the home. The ashtray on the back porch had cigarette buds   Plan:  [facility] CM called [facility] spoke with Merry Proud to confirm Edward & Mrs Katzenstein have 65 credits quarterly to use for their OTC program- [facility] CM requested they each be sent OTC catalogs  Edward Niehoff was ordered a scale to assist with CHF monitoring, a thermometer,  regular tylenol 100 ct,  Band-aids and a toothbrush  Reviewed CHF action plan   Yomar Mejorado L. [name], RN, BSN, [place] Care Management [phone]

## 2017-07-27 NOTE — Telephone Encounter (Signed)
[facility] called and requested samples of eliquis for patient. He has already used a discount card and has not been taking medication due to cost. Samples left up front for patient

## 2017-08-01 ENCOUNTER — Other Ambulatory Visit: Payer: Self-pay | Admitting: Physician Assistant

## 2017-08-01 NOTE — Telephone Encounter (Signed)
Refill denied patient is suppose to be getting  All medications from [place]

## 2017-08-02 ENCOUNTER — Other Ambulatory Visit: Payer: Self-pay | Admitting: Pharmacist

## 2017-08-02 DIAGNOSIS — J449 Chronic obstructive pulmonary disease, unspecified: Secondary | ICD-10-CM | POA: Diagnosis not present

## 2017-08-02 NOTE — Patient Outreach (Signed)
[facility]) Care Management  08/02/2017  Alfonzo BOHDEN DUNG [birth year] [id number]  Patient was referred to [place] by [facility] RN Vaughan Basta for medication management.  Per 07/19/17 PCP note there are concerns for patient's management of medications as well.    Noted in chart per 07/26/17 and 07/27/17 notes patient is not taking apixaban due to cost.  Unsure if patient would qualify for any assistance as he would need to meet manufacturer patient assistance program requirements and may need to provide proof of SSA Extra Help denial.      Successful phone outreach to patient, HIPAA details verified, and purpose of call explained to patient to discuss medication needs.   Patient states he is "taking his medications right now."   When asked if he has all of his medications, he reports "they haven't sent them all yet," referring to his pharmacy, [facility]. Asked if he knows which medications he was missing and he said "no."  Offered to complete a home visit to review his medications and see which medications he has/needs and he said "no" and abruptly hung up the call.   Plan:  Will not open pharmacy case at this time due to patient refusal with call being hung up.   Will update [facility] RN [name] and PCP, [name], PA.    Karrie Meres, PharmD, [place] [phone]

## 2017-08-03 ENCOUNTER — Telehealth: Payer: Self-pay | Admitting: *Deleted

## 2017-08-03 NOTE — Patient Outreach (Signed)
[facility]) Care Management  08/03/2017  Edward [name] Feb 02, 1935 [id number]   Care coordination   CM collaborated with Edward [name] staff, Edward [name] after an attempt by pharmacist to reach and engage with Edward [name] was unsuccessful (refer to [facility] pharmacist note-Refused pharmacy assist, hung up phone)  Plan- CM is scheduled for a home visit with Edward [name] in 2 weeks and will review importance/need of [facility] pharmacy services, review medications more and identify any issues to collaborate with [facility] pharmacy if needed. [facility] pharmacist updated Route note to pcp and other care team members listed on EPIC care team  Edward [name] L. Edward Hamman, RN, BSN, [place] Care Management [phone]

## 2017-08-06 ENCOUNTER — Other Ambulatory Visit: Payer: Self-pay | Admitting: Physician Assistant

## 2017-08-07 ENCOUNTER — Other Ambulatory Visit: Payer: Self-pay

## 2017-08-07 ENCOUNTER — Telehealth: Payer: Self-pay

## 2017-08-07 MED ORDER — INSULIN NPH (HUMAN) (ISOPHANE) 100 UNIT/ML ~~LOC~~ SUSP: SUBCUTANEOUS | 1 refills | Status: DC

## 2017-08-10 NOTE — Telephone Encounter (Signed)
FYI: Patient son called and states Mr Edward [name] gets his novolin insulin from [employer] because it is cheaper.

## 2017-08-13 ENCOUNTER — Other Ambulatory Visit: Payer: Self-pay | Admitting: *Deleted

## 2017-08-13 NOTE — Patient Outreach (Signed)
Edward [name] [facility]) Care Management   08/13/2017  Edward [name] 03-09-1935 [id number]  Edward [name] is an 82 y.o. male with a past medical history of chronic respiratory failure on home oxygen, COPD, pulmonary fibrosis, chronic diastolic congestive heart failure, diabetes type 2, pneumonia, rapid atrial fibrillation,  sepsis, hypertension, hyperlipidemia, pulmonary fibrosis and chronic kidney disease.  [facility] CM notes pt with 2 admissions and ED visits in the last 6 months. He was referred to [facility] Cm with uncontrolled DM & knowledge deficit regarding regimen by Telephonic [facility] [place], L Manning.  He was also referred to [place] for medication assistance. [facility] CM notes pt with 2 admissions and ED visits in the last 6 months. Both admissions were in June 2018.   Subjective: see notes below  Objective:   BP 136/74   Pulse 82   Temp 97.6 F (36.4 C) (Oral)   Resp 18   Wt 174 lb (78.9 kg)   SpO2 93%   BMI 28.96 kg/m   Review of Systems  HENT: Negative.   Eyes: Negative.   Respiratory: Negative.   Cardiovascular: Negative.   Gastrointestinal: Negative.   Genitourinary: Negative.   Musculoskeletal: Positive for joint pain.  Skin: Negative.   Neurological: Positive for weakness. Negative for dizziness, tingling, tremors, sensory change, speech change, focal weakness, seizures, loss of consciousness and headaches.  Endo/Heme/Allergies: Negative.   Psychiatric/Behavioral: Negative.     Physical Exam  Constitutional: He is oriented to person, place, and time. He appears well-developed and well-nourished.  HENT:  Head: Normocephalic and atraumatic.  Eyes: Conjunctivae are normal. Pupils are equal, round, and reactive to light.  Neck: Normal range of motion. Neck supple.  Cardiovascular: Normal rate.  Respiratory: Effort normal and breath sounds normal.  GI: Soft. Bowel sounds are normal.  Neurological: He is alert and oriented to person, place, and time.  Skin: Skin is  warm and dry.  Psychiatric: He has a normal mood and affect. His behavior is normal. Judgment and thought content normal.    Encounter Medications:   Outpatient Encounter Medications as of 08/13/2017  Medication Sig  . albuterol (PROVENTIL) (2.5 MG/3ML) 0.083% nebulizer solution INHALE 1 VIAL VIA NEBULIZER EVERY 6 HOURS AS NEEDED FOR WHEEZING OR SHORTNESS OF BREATH  . apixaban (ELIQUIS) 5 MG TABS tablet Take 1 tablet (5 mg total) by mouth 2 (two) times daily.  . baclofen (LIORESAL) 10 MG tablet Take 10 mg by mouth 3 (three) times daily.  . budesonide (PULMICORT) 0.5 MG/2ML nebulizer solution Take 2 mLs (0.5 mg total) by nebulization 2 (two) times daily.  Marland Kitchen diltiazem (CARDIZEM CD) 240 MG 24 hr capsule Take 1 capsule (240 mg total) by mouth daily.  . furosemide (LASIX) 40 MG tablet Take 2 tablets (80 mg total) by mouth 3 (three) times daily.  . insulin NPH Human (NOVOLIN N) 100 UNIT/ML injection 30 units with breakfast daily and 30 units with supper daily  . Insulin NPH, Human,, Isophane, (HUMULIN N) 100 UNIT/ML Kiwkpen Inject 30 units at breakfast inject 30 units at supper time.  Marland Kitchen ipratropium (ATROVENT) 0.02 % nebulizer solution Take 2.5 mLs (0.5 mg total) by nebulization 4 (four) times daily.  Marland Kitchen losartan (COZAAR) 100 MG tablet Take 1 tablet (100 mg total) by mouth daily.  . metFORMIN (GLUCOPHAGE) 1000 MG tablet Take 1 tablet (1,000 mg total) by mouth 2 (two) times daily.  . metoprolol tartrate (LOPRESSOR) 25 MG tablet Take 1 tablet (25 mg total) by mouth 2 (two) times daily.  Marland Kitchen  mirtazapine (REMERON) 30 MG tablet TAKE 1 TABLET BY MOUTH AT BEDTIME  . omeprazole (PRILOSEC) 20 MG capsule Take 1 capsule (20 mg total) by mouth daily.  . ONE TOUCH ULTRA TEST test strip USE TO CHECK BLOOD SUGAR EACH MORNING AND THEN 2 HOURS AFTER ANY MEAL  . ONETOUCH DELICA LANCETS 02I MISC USE TO CHECK BLOOD SUGAR TWICE DAILY AS DIRECTED  . potassium chloride SA (K-DUR,KLOR-CON) 20 MEQ tablet Take 2 tablets 3 times  a day  . pravastatin (PRAVACHOL) 80 MG tablet Take 1 tablet (80 mg total) by mouth at bedtime.   No facility-administered encounter medications on file as of 08/13/2017.     Functional Status:   In your present state of health, do you have any difficulty performing the following activities: 03/04/2017 02/25/2017  Hearing? N N  Vision? N N  Difficulty concentrating or making decisions? Y N  Walking or climbing stairs? Y Y  Dressing or bathing? Y N  Doing errands, shopping? N N  Preparing Food and eating ? - -  Using the Toilet? - -  In the past six months, have you accidently leaked urine? - -  Do you have problems with loss of bowel control? - -  Managing your Medications? - -  Managing your Finances? - -  Housekeeping or managing your Housekeeping? - -  Some recent data might be hidden    Fall/Depression Screening:    Fall Risk  07/20/2017 06/20/2017 07/27/2016  Falls in the past year? No No No   PHQ 2/9 Scores 07/20/2017 06/20/2017 07/27/2016 03/16/2016 02/09/2015 12/01/2013  PHQ - 2 Score 0 0 0 0 1 0  PHQ- 9 Score - 0 - - - -    Assessment:   Met with Edward [name] and their son at their home   CHF He has received his scale and his weight today =174 lb CM educated him on the use of his scale and how to  zero scale to obtain an accurate weight. Reviewed importance of checking weight and using teach back method he was able to verbalize reasons to use his scale and how to zero scale  Medication- Reviewed and discussed eliquis and wrote purpose of medications on eliquis bottle.  He was visited by a male, Lilli Few. This person's business card indicated he is an Counsellor for [employer] agency  (bilingual [phone] [place] [address] [place] fax [phone] [email or web address] - [email or web address])  Receive medicines via [facility]   Does not feel proair is effective "I don't get nothing out of it"  watched him take a puff moved to 198 from 200. CM educated Edward [name] on the appropriate use of his inhaler Encouraged him to  speak with pcp about going back to an older inhaler if he feels more comfortable. Patient refused to use as ordered for sob every morning". Reports his issues are resolved when moving oxygen up to 2 L/[place]  Unable to find Prilosec, Remeron, baclofen  Edward Daniello agreed to have [place] call or visit to assist with medication issues  Gave him a 2019 [facility] calendar last week   Diabetes -Admits to not checking cbg and writing down as asked by pcp refused to see an endocrinologist if pcp requests  Reports cbgs have been as high as 500 Past cbg results includes values of 147, 107,187 and 178  hospice was involved stopped visits about 2 months ago Pcp  services Edward Needle reports he saw Dena Billet on 07/25/17 and he had been seeing patient every week  In November 2018  Plan:  Follow up with Edward Farooqui in 2 weeks  Assisted Mrs Hove to order a catalog and [facility] Spoke with Janelli antacid x 2, antacid tabx1, itch cream x1 toothpaste x 2, miralax not available   Avarae Zwart L. [name], RN, BSN, [place] Care Management [phone]

## 2017-08-15 ENCOUNTER — Other Ambulatory Visit: Payer: Self-pay

## 2017-08-15 ENCOUNTER — Encounter: Payer: Self-pay | Admitting: Physician Assistant

## 2017-08-15 ENCOUNTER — Ambulatory Visit (INDEPENDENT_AMBULATORY_CARE_PROVIDER_SITE_OTHER): Payer: Medicare Other | Admitting: Physician Assistant

## 2017-08-15 ENCOUNTER — Ambulatory Visit (HOSPITAL_COMMUNITY)
Admission: RE | Admit: 2017-08-15 | Discharge: 2017-08-15 | Disposition: A | Discharge status: Home / Self Care | Payer: Medicare Other | Source: Ambulatory Visit | Attending: Physician Assistant | Admitting: Physician Assistant

## 2017-08-15 VITALS — BP 150/84 | HR 83 | Temp 97.7°F | Resp 16 | Wt 189.0 lb

## 2017-08-15 DIAGNOSIS — M7989 Other specified soft tissue disorders: Secondary | ICD-10-CM | POA: Insufficient documentation

## 2017-08-15 DIAGNOSIS — M79661 Pain in right lower leg: Secondary | ICD-10-CM | POA: Insufficient documentation

## 2017-08-15 NOTE — Progress Notes (Signed)
Patient ID: Edward [name] MRN: [id number], DOB: 04/14/1935, 82 y.o. Date of Encounter: @DATE @  Chief Complaint:  Chief Complaint  Patient presents with  . Swelling, redness in leg    HPI: 82 y.o. year old male  presents with above.   He has a very complex medical history complicated by noncompliance with medications. He has had many recent office visits with me and at some of his most recent visits with me we had gotten [facility] and [facility] involved to try to help with noncompliance issues.  He had used those resources in the past.  Unfortunately, again this time -- he is not being very compliant with working with them and having their assistance.  Their most recent communication to me was by home visit on Monday, 08/13/17.  Staff member then sent me a staff message that is not showing up in patient's chart.  Staff message documented that she was at the patient's home that day she noted that his right lower leg was swollen warm and pink.  Noted swelling of his ankles.  I recommended he come in for office visit with me.  He presents for that follow-up evaluation.   Past Medical History:  Diagnosis Date  . Allergy    Rhinitis  . Bronchitis   . Chronic respiratory failure ([place])   . Colon polyps   . COPD (chronic obstructive pulmonary disease) ([place])   . Diabetes mellitus   . Elevated lipids   . Hypercholesterolemia   . Hypertension   . On home O2    2L N/C   . PSA elevation   . Pulmonary fibrosis ([place])   . Vitamin D deficiency      Home Meds: Outpatient Medications Prior to Visit  Medication Sig Dispense Refill  . albuterol (PROVENTIL) (2.5 MG/3ML) 0.083% nebulizer solution INHALE 1 VIAL VIA NEBULIZER EVERY 6 HOURS AS NEEDED FOR WHEEZING OR SHORTNESS OF BREATH 360 mL 5  . apixaban (ELIQUIS) 5 MG TABS tablet Take 1 tablet (5 mg total) by mouth 2 (two) times daily. 60 tablet 0  . baclofen (LIORESAL) 10 MG tablet Take 10 mg by mouth 3 (three) times daily.    .  budesonide (PULMICORT) 0.5 MG/2ML nebulizer solution Take 2 mLs (0.5 mg total) by nebulization 2 (two) times daily. 120 mL 5  . diltiazem (CARDIZEM CD) 240 MG 24 hr capsule Take 1 capsule (240 mg total) by mouth daily. 30 capsule 3  . furosemide (LASIX) 40 MG tablet Take 2 tablets (80 mg total) by mouth 3 (three) times daily. 120 tablet 2  . insulin NPH Human (NOVOLIN N) 100 UNIT/ML injection 30 units with breakfast daily and 30 units with supper daily 10 mL 1  . Insulin NPH, Human,, Isophane, (HUMULIN N) 100 UNIT/ML Kiwkpen Inject 30 units at breakfast inject 30 units at supper time. 20 mL 11  . ipratropium (ATROVENT) 0.02 % nebulizer solution Take 2.5 mLs (0.5 mg total) by nebulization 4 (four) times daily. 25 mL 12  . losartan (COZAAR) 100 MG tablet Take 1 tablet (100 mg total) by mouth daily. 30 tablet 1  . metFORMIN (GLUCOPHAGE) 1000 MG tablet Take 1 tablet (1,000 mg total) by mouth 2 (two) times daily. 60 tablet 1  . metoprolol tartrate (LOPRESSOR) 25 MG tablet Take 1 tablet (25 mg total) by mouth 2 (two) times daily. 60 tablet 1  . mirtazapine (REMERON) 30 MG tablet TAKE 1 TABLET BY MOUTH AT BEDTIME 30 tablet 5  . omeprazole (PRILOSEC)  20 MG capsule Take 1 capsule (20 mg total) by mouth daily. 30 capsule 3  . ONE TOUCH ULTRA TEST test strip USE TO CHECK BLOOD SUGAR EACH MORNING AND THEN 2 HOURS AFTER ANY MEAL 100 each 2  . ONETOUCH DELICA LANCETS 66Q MISC USE TO CHECK BLOOD SUGAR TWICE DAILY AS DIRECTED 100 each 2  . potassium chloride SA (K-DUR,KLOR-CON) 20 MEQ tablet Take 2 tablets 3 times a day 180 tablet 3  . pravastatin (PRAVACHOL) 80 MG tablet Take 1 tablet (80 mg total) by mouth at bedtime. 30 tablet 1   No facility-administered medications prior to visit.     Allergies:  Allergies  Allergen Reactions  . Ace Inhibitors Other (See Comments)    Hyperkalemia--07/23/2013:patient states not familiar with the following allergy    Social History   Socioeconomic History  . Marital  status: Married    Spouse name: Not on file  . Number of children: Not on file  . Years of education: Not on file  . Highest education level: Not on file  Social Needs  . Financial resource strain: Not on file  . Food insecurity - worry: Not on file  . Food insecurity - inability: Not on file  . Transportation needs - medical: Not on file  . Transportation needs - non-medical: Not on file  Occupational History  . 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 Smoking status: Former Smoker    Packs/day: 1.50    Years: 60.00    Pack years: 90.00    Types: Cigarettes    Last attempt to quit: 12/31/2012    Years since quitting: 4.6  . Smokeless tobacco: Never Used  Substance and Sexual Activity  . Alcohol use: No  . Drug use: No  . Sexual activity: Yes    Birth control/protection: None  Other Topics Concern  . Not on file  Social History Narrative  . Not on file    Family History  Problem Relation Age of Onset  . Heart disease Mother   . CAD Other   . Diabetes Other      Review of Systems:  See HPI for pertinent ROS. All other ROS negative.    Physical Exam: Blood pressure (!) 150/84, pulse 83, temperature 97.7 F (36.5 C), temperature source Oral, resp. rate 16, weight 85.7 kg (189 lb), SpO2 98 %., Body mass index is 31.45 kg/m. General: WM. Appears in no acute distress. Neck: Supple. No thyromegaly. No lymphadenopathy. Lungs: Distant, decreased breath sounds throughout.  Slight wheezes throughout bilaterally. Heart: RRR with S1 S2. No murmurs, rubs, or gallops. Musculoskeletal:  Strength and tone normal for age. Extremities/Skin: His right calf has pink erythema.  Right calf is warm.  Right calf is more swollen compared to the left.  He does have 1+ pitting edema bilateral ankles. Neuro: Alert and oriented X 3. Moves all extremities spontaneously. Gait is normal. CNII-XII grossly in tact. Psych:  Responds to questions appropriately with a normal  affect.     ASSESSMENT AND PLAN:  82 y.o. year old male with  1. Pain and swelling of right lower leg He is going directly for a venous ultrasound to evaluate for possible DVT.  We will follow-up with him when I get this report.  If negative for DVT, then will treat with antibiotics for cellulitis. - VAS [place] LOWER EXTREMITY VENOUS (DVT); Future   Signed, Olean Ree [place], [place], [facility] 08/15/2017 10:38 AM

## 2017-08-17 ENCOUNTER — Telehealth: Payer: Self-pay

## 2017-08-17 MED ORDER — CEPHALEXIN 500 MG PO CAPS: 500.0000 mg | ORAL_CAPSULE | Freq: Four times a day (QID) | ORAL | 0 refills | Status: DC

## 2017-08-17 NOTE — Telephone Encounter (Signed)
-----   Message from Orlena Sheldon, PA-C sent at 08/15/2017  1:27 PM EST ----- Tell patient that the ultrasound test shows no blood clot.  Tell him to start the following medication to help with the pinkness etc. on the right lower leg: Keflex 500 mg 1 p.o. 4 times per day---for  7 days--- dispense #28+0.

## 2017-08-17 NOTE — Telephone Encounter (Signed)
Patient is aware of medication to start

## 2017-08-24 ENCOUNTER — Other Ambulatory Visit: Payer: Self-pay | Admitting: *Deleted

## 2017-08-24 NOTE — Patient Outreach (Signed)
[place] [facility]) Care Management  08/24/2017  DUANE EARNSHAW 05-12-1935 [id number]   Care coordination   [facility] CM collaborated with [facility] pharmacist, Lennette Bihari about plan of care for Mr Parcell [facility] CM had response from In basket message from [name] on 08/15/17 who states Mr Kreis missed an office visit with her.  Reviewed missing medications and M Doren Custard is aware of them  Response state missing medicine are symptomatic medicine and if needed may be discontinued. Will discuss with Mr Nobbe   Plan Follow up with Mr Ferrebee next week for home visit  [place]. [name], RN, BSN, [place] Care Management [phone]

## 2017-08-27 ENCOUNTER — Other Ambulatory Visit: Payer: Self-pay | Admitting: *Deleted

## 2017-08-27 ENCOUNTER — Other Ambulatory Visit: Payer: Self-pay

## 2017-08-27 ENCOUNTER — Other Ambulatory Visit: Payer: Self-pay | Admitting: Pharmacist

## 2017-08-27 NOTE — Patient Outreach (Signed)
[place] [facility]) Care Management  Edward [name]   08/27/2017  Edward [name] 1934-11-03 [id number]  Subjective:  In basket message was received from Edward [name] on 08/17/17 that patient had agreed to have [place] accompany her during home visit.   Home visit completed with patient and [facility] RN Edward [name].  His medications were reviewed based on what patient provided.  Patient was resistant at times to bringing out his medications and needed to be asked multiple times to see his bottles.  He did not bring out nebulized medications.     Patient has a past medical history significant for:  Hypertension, atrial fibrillation, diastolic heart failure, COPD, type 2 diabetes mellitus.    He is concerned with medication cost and reports he applied for Social Security Extra Help but claims he was denied.   Objective:   Encounter Medications: Outpatient Encounter Medications as of 08/27/2017  Medication Sig Note  . albuterol (PROVENTIL) (2.5 MG/3ML) 0.083% nebulizer solution INHALE 1 VIAL VIA NEBULIZER EVERY 6 HOURS AS NEEDED FOR WHEEZING OR SHORTNESS OF BREATH   . budesonide (PULMICORT) 0.5 MG/2ML nebulizer solution Take 2 mLs (0.5 mg total) by nebulization 2 (two) times daily.   Marland Kitchen diltiazem (CARDIZEM CD) 240 MG 24 hr capsule Take 1 capsule (240 mg total) by mouth daily.   . furosemide (LASIX) 40 MG tablet Take 2 tablets (80 mg total) by mouth 3 (three) times daily.   . insulin NPH Human (NOVOLIN N) 100 UNIT/ML injection 30 units with breakfast daily and 30 units with supper daily   . ipratropium (ATROVENT) 0.02 % nebulizer solution Take 2.5 mLs (0.5 mg total) by nebulization 4 (four) times daily.   Marland Kitchen losartan (COZAAR) 100 MG tablet Take 1 tablet (100 mg total) by mouth daily.   . metFORMIN (GLUCOPHAGE) 1000 MG tablet Take 1 tablet (1,000 mg total) by mouth 2 (two) times daily.   . metoprolol tartrate (LOPRESSOR) 25 MG tablet Take 1 tablet (25 mg total) by mouth 2 (two) times  daily.   . potassium chloride SA (K-DUR,KLOR-CON) 20 MEQ tablet Take 2 tablets 3 times a day   . pravastatin (PRAVACHOL) 80 MG tablet Take 1 tablet (80 mg total) by mouth at bedtime.   Marland Kitchen apixaban (ELIQUIS) 5 MG TABS tablet Take 1 tablet (5 mg total) by mouth 2 (two) times daily. (Patient not taking: Reported on 08/27/2017)   . baclofen (LIORESAL) 10 MG tablet Take 10 mg by mouth 3 (three) times daily.   . mirtazapine (REMERON) 30 MG tablet TAKE 1 TABLET BY MOUTH AT BEDTIME (Patient not taking: Reported on 08/27/2017)   . omeprazole (PRILOSEC) 20 MG capsule Take 1 capsule (20 mg total) by mouth daily. (Patient not taking: Reported on 08/27/2017)   . ONE TOUCH ULTRA TEST test strip USE TO CHECK BLOOD SUGAR EACH MORNING AND THEN 2 HOURS AFTER ANY MEAL   . ONETOUCH DELICA LANCETS 50T MISC USE TO CHECK BLOOD SUGAR TWICE DAILY AS DIRECTED   . [DISCONTINUED] cephALEXin (KEFLEX) 500 MG capsule Take 1 capsule (500 mg total) 4 (four) times daily by mouth. (Patient not taking: Reported on 08/27/2017) 08/27/2017: Finished dose   . [DISCONTINUED] Insulin NPH, Human,, Isophane, (HUMULIN N) 100 UNIT/ML Kiwkpen Inject 30 units at breakfast inject 30 units at supper time. (Patient not taking: Reported on 08/27/2017)    No facility-administered encounter medications on file as of 08/27/2017.     Functional Status: In your present state of health, do you have any  difficulty performing the following activities: 03/04/2017 02/25/2017  Hearing? N N  Vision? N N  Difficulty concentrating or making decisions? Y N  Walking or climbing stairs? Y Y  Dressing or bathing? Y N  Doing errands, shopping? N N  Some recent data might be hidden    Fall/Depression Screening: Fall Risk  08/15/2017 07/20/2017 06/20/2017  Falls in the past year? No No No   PHQ 2/9 Scores 08/15/2017 07/20/2017 06/20/2017 07/27/2016 03/16/2016 02/09/2015 12/01/2013  PHQ - 2 Score 0 0 0 0 0 1 0  PHQ- 9 Score 0 - 0 - - - -       Assessment: Medication review per medication list in chart and bottles in patient's possession.    Drugs sorted by system:  Neurologic/Psychologic:  Cardiovascular: -apixaban---patient reports not taking secondary to cost  -diltiazem---last filled 07/26/17 #30   -furosemide---last filled 07/26/17 #180  -losartan---last filled 07/26/17 #30  -metoprolol tartrate---last filled 07/26/17 #60 -potassium chloride---last filled 08/04/17 #180 -pravastatin---last filled 07/26/17 #30   Pulmonary/Allergy: -albuterol nebs -budesonide nebs -ipratropium nebs  Endocrine: -insulin regular---reports he is obtaining from [facility]  -metformin---last filled 07/26/17 #60   Infectious Diseases: -cephalexin---patient reports he completed course   Miscellaneous: -baclofen---patient reports not taking  -mirtazapine---patient reports not taking  -omeprazole---patient reports not taking   Other issues noted:   1) apixaban---if resumed and indication is risk reduction of stroke in setting of afib, recommend dose decrease to 2.5 mg twice daily based on patient being greater than 57 years old and current SCr greater than 1.5 (07/19/17 SCr 1.65).    2) Noted during chart review SCr began to increase when furosemide dose was increased, consider assessing patient for efficacy at next appointment, such as is he urinating within reasonable time frame after taking furosemide dose.    3) If eGFR remains between 30-45, dose decrease to metformin may be warranted---eGFR 39 on 07/19/17.    Medication patient assistance:  IT trainer was called during home visit by [facility] RN---representative with SSA spoke with Edward [name] and reports Edward [name] is presently receiving Partial Extra Help at 75%.  [employer]A representative reports Edward [name] should be receiving a letter in the mail in the next month regarding his SSA Extra Help. Patient was counseled to review this letter for explanation of why he  got partial versus full extra help.    Patient assistance options for apixaban and insulin are very limited as patient has SSA Extra Help.    He may have cost savings with Tier 1 and Tier 2 medications available from [facility] mail order pharmacy for $0 co-pay/90 day supply until patient reaches Part D coverage gap.   Plan:  Will route this note to PCP.    Patient denies further pharmacy follow-up at this time.    Pharmacy episode will be closed as patient declines follow-up.   Karrie Meres, PharmD, [place] [phone]

## 2017-08-27 NOTE — Patient Outreach (Addendum)
[place] [facility]) Care Management   08/27/2017  Jovonni SAMIT SYLVE [birth year] [id number]  Cary is an 82 y.o. male with medical history significant of chronic respiratory failure on home oxygen, COPD, pulmonary fibrosis, chronic diastolic congestive heart failure, diabetes pneumonia, rapid atrial fibrillation and sepsis  Mr Corvino had been previously followed by [facility] CM services in 2017 and the last contact was made in January 2018.  His [facility] consent form noted in EPIC.  He was referred to this [facility] CM from [facility] telephonic RN, Halford Decamp  EPIC indicates 2 admissions in the last 6 months, both in June 2018.  Subjective: see note below  Objective:   BP 112/60   Pulse 68   Temp 97.6 F (36.4 C) (Oral)   Resp 20   Wt 187 lb (84.8 kg)   SpO2 93%   BMI 31.12 kg/m  Review of Systems  HENT: Negative.   Eyes: Negative.   Respiratory: Negative.   Cardiovascular: Negative.   Gastrointestinal: Negative.   Genitourinary: Negative.   Musculoskeletal: Positive for joint pain.  Skin: Negative.   Neurological: Positive for weakness. Negative for dizziness, tingling, tremors, sensory change, speech change, focal weakness, seizures, loss of consciousness and headaches.  Endo/Heme/Allergies: Negative.   Psychiatric/Behavioral: Negative.     Physical Exam  Constitutional: He is oriented to person, place, and time. He appears well-developed and well-nourished.  HENT:  Head: Normocephalic and atraumatic.  Eyes: Pupils are equal, round, and reactive to light.  Neck: Normal range of motion. Neck supple.  Cardiovascular: Normal rate and regular rhythm.  Respiratory: Effort normal and breath sounds normal.  GI: Soft. Bowel sounds are normal.  Musculoskeletal: Normal range of motion.  Neurological: He is alert and oriented to person, place, and time.  Skin: Skin is warm and dry.  Psychiatric: He has a normal mood and affect. His behavior is normal. Judgment and thought content  normal.    Encounter Medications:   Outpatient Encounter Medications as of 08/27/2017  Medication Sig  . albuterol (PROVENTIL) (2.5 MG/3ML) 0.083% nebulizer solution INHALE 1 VIAL VIA NEBULIZER EVERY 6 HOURS AS NEEDED FOR WHEEZING OR SHORTNESS OF BREATH  . apixaban (ELIQUIS) 5 MG TABS tablet Take 1 tablet (5 mg total) by mouth 2 (two) times daily.  . baclofen (LIORESAL) 10 MG tablet Take 10 mg by mouth 3 (three) times daily.  . budesonide (PULMICORT) 0.5 MG/2ML nebulizer solution Take 2 mLs (0.5 mg total) by nebulization 2 (two) times daily.  . cephALEXin (KEFLEX) 500 MG capsule Take 1 capsule (500 mg total) 4 (four) times daily by mouth.  . diltiazem (CARDIZEM CD) 240 MG 24 hr capsule Take 1 capsule (240 mg total) by mouth daily.  . furosemide (LASIX) 40 MG tablet Take 2 tablets (80 mg total) by mouth 3 (three) times daily.  . insulin NPH Human (NOVOLIN N) 100 UNIT/ML injection 30 units with breakfast daily and 30 units with supper daily  . Insulin NPH, Human,, Isophane, (HUMULIN N) 100 UNIT/ML Kiwkpen Inject 30 units at breakfast inject 30 units at supper time.  Marland Kitchen ipratropium (ATROVENT) 0.02 % nebulizer solution Take 2.5 mLs (0.5 mg total) by nebulization 4 (four) times daily.  Marland Kitchen losartan (COZAAR) 100 MG tablet Take 1 tablet (100 mg total) by mouth daily.  . metFORMIN (GLUCOPHAGE) 1000 MG tablet Take 1 tablet (1,000 mg total) by mouth 2 (two) times daily.  . metoprolol tartrate (LOPRESSOR) 25 MG tablet Take 1 tablet (25 mg total) by mouth 2 (two)  times daily.  . mirtazapine (REMERON) 30 MG tablet TAKE 1 TABLET BY MOUTH AT BEDTIME  . omeprazole (PRILOSEC) 20 MG capsule Take 1 capsule (20 mg total) by mouth daily.  . ONE TOUCH ULTRA TEST test strip USE TO CHECK BLOOD SUGAR EACH MORNING AND THEN 2 HOURS AFTER ANY MEAL  . ONETOUCH DELICA LANCETS 16X MISC USE TO CHECK BLOOD SUGAR TWICE DAILY AS DIRECTED  . potassium chloride SA (K-DUR,KLOR-CON) 20 MEQ tablet Take 2 tablets 3 times a day  .  pravastatin (PRAVACHOL) 80 MG tablet Take 1 tablet (80 mg total) by mouth at bedtime.   No facility-administered encounter medications on file as of 08/27/2017.     Functional Status:   In your present state of health, do you have any difficulty performing the following activities: 03/04/2017 02/25/2017  Hearing? N N  Vision? N N  Difficulty concentrating or making decisions? Y N  Walking or climbing stairs? Y Y  Dressing or bathing? Y N  Doing errands, shopping? N N  Some recent data might be hidden    Fall/Depression Screening:    Fall Risk  08/15/2017 07/20/2017 06/20/2017  Falls in the past year? No No No   PHQ 2/9 Scores 08/15/2017 07/20/2017 06/20/2017 07/27/2016 03/16/2016 02/09/2015 12/01/2013  PHQ - 2 Score 0 0 0 0 0 1 0  PHQ- 9 Score 0 - 0 - - - -    Assessment:   Met with Mr Lohmeyer at his home for the third home visit Referral was for uncontrolled DM & knowledge deficit regarding medical regimen and also referred to pharmacy as needed for medication management (MD office referred patient to [place] also). Mr Mittelman denied any medical issues today but appeared "tired"  Mr Wimberly confirms he did see [name] on 08/15/17 and evaluated with a DVT which his ultrasound found to be negative for a DVT and he was placed in keflex for redness of his LE.  Issue now resolved   Diabetes CBG 150 this am. Mr Delmonaco reports he is doing well with DM and only noting that his evening cbgs are high  CM discussed patterns of eating for dinner and hs snacks. He reports "not eating any different than what I eat throughout the day", Generally "eat diner at around three o'clock. " and reports the last time of the day he generally eats is about six o'clock" He was unable to recall what he had eaten on 08/26/17 pm. CM discussed increased carbohydrate/starchy foods that may cause an increase in cbg for the night cbg.  Denied offer to have an endocrinologist consult and only wants to see Dena Billet. Denies every seeing an endocrinologist in the past.  Eats breakfast around 3 am per Mr Koury who confirms he is a morning person  Medication/pharmacy With review of his present medications, CM discussed with him the contact with [name] about Remeron, Prilosec and baclofen.  These medications are symptomatic medications and per [name] can be discontinued if he prefers.  Mr Mcclane confirms he is not taking any of these medications at this time.   Call to [facility] with Jackolyn Confer to review eliquis and keflex. Keflex was sent 7 days ago per Pamala Hurry at [place]. Mr Rickel and he confirms " I finished all of them" Eliquis -Barbara at ([facility]) confirmed there is a Rx on filed for Eliquis but it ws not obtained from [facility] related to the Co pays being too high, they  tried to use the [employer] but it is a one time lifetime voucher and had already been used The cost of eliquis will now be $64.   Pharmacist discussed SSI medication assistance program.  Mr Winterton is now in donut hole. The [facility] pharmacist educated him on donut hole.  Pharmacist answered all Mr Opheim questions about the Donut hole.  Mr Pryce states he has tried to appliy to [employer] for extra help per pt a few times to get a higher check. He confirms he gets a SSI (check) now and is concerned about further applying processes to "mess up my check now" Pharmacist clarified that the need to apply for medicines for extra help and SSI check assistance are two different processes but both look at the income and assets. Mr Teeple was shown the [place] website so he would be able to understand the differences.  He voiced understanding.  CM called [place] SSI office and spoke with Kyung Rudd to clarify that Mr Galindo has been approved/met qualification for 75% subsidy (partial extra help- this is why drug company may not be assistance- cause SSI is assisting). Mr Imel is presently receiving the  partial medicine assistance . Kyung Rudd confirms that Mr Markovic letter for 2019 should be arriving soon and will tell him about his 2019 coverage and why this is partial and not full help  Pharmacist discussed that the help is probably assisting with his premium.   Still have not received [facility] catalog  CM counseled Mr Burnham on him following up with SSI office and any other resources provided by this [facility] CM and pharmacist as his responsibility in order to obtain the help he needs His son offered to help him as needed   Recommended using [facility] to get insulin for $25 a vial.  His son also confirmed they had been getting insulin at [facility] because it is cheaper and Mr Osvaldo Human can afford the $25   Other options are to see if present pharmacy can do generics for eliquis and insulins, see if united health care pharmacy can be a benefit or to see if Eliquis and insulins can be changed to more economical medicines (like possibly coumadin, etc)   CM discussed continuing to process with future goals for medications and DM education Mr Williamsen needed encouragement to weigh today and to show CM his glucometer but eventually completed these tasks after CM reviewed the DM and CHF action plans in his [facility] calendar His weight and Vitals signs all recorded in his [facility] calendar and he was encouraged to be compliant with writing down these values in the calendar.  He is noted to be compliant with writing down his cbg values as ordered by pcp   Plan:  To follow up with Mr Burdin in 2-3 weeks for home visit Routed note to pcp and sent an in basket message - medicines, DM, any further concerns Continue to collaborate with [facility] phamacist  Johnette Teigen L. [name], RN, BSN, [place] Care Management [phone]

## 2017-08-29 NOTE — Progress Notes (Signed)
Inform patient that we need to change the dose on 2 of his medications. Remove the prior dose on the metformin.  Start metformin 500 mg 1 p.o. every morning #90+1 Move the current dose of apixaban.  Start apixaban 2.5 mg 1 p.o. twice daily #60+2

## 2017-08-30 ENCOUNTER — Telehealth: Payer: Self-pay

## 2017-08-30 MED ORDER — APIXABAN 2.5 MG PO TABS: 2.5000 mg | ORAL_TABLET | Freq: Two times a day (BID) | ORAL | 2 refills | Status: DC

## 2017-08-30 MED ORDER — METFORMIN HCL 500 MG PO TABS: 500.0000 mg | ORAL_TABLET | Freq: Every day | ORAL | 1 refills | Status: DC

## 2017-08-30 NOTE — Telephone Encounter (Signed)
Author: Orlena Sheldon, PA-C Service: Orthopedics Author Type: Physician Assistant  Filed: 08/29/2017 10:01 AM Encounter Date: 08/27/2017 Status: Signed  Editor: [name] (Physician Assistant)       [] Hide copied text  [] Hover for details   Inform patient that we need to change the dose on 2 of his medications. Remove the prior dose on the metformin.  Start metformin 500 mg 1 p.o. every morning #90+1 Move the current dose of apixaban.  Start apixaban 2.5 mg 1 p.o. twice daily #60+2       Patient aware of medication changes

## 2017-08-30 NOTE — Telephone Encounter (Signed)
-----  Message from Orlena Sheldon, PA-C sent at 08/29/2017 10:01 AM EST -----   ----- Message ----- From: Lin Givens, [facility] Sent: 08/28/2017  10:15 AM To: Orlena Sheldon, PA-C  Ms Dixon:  [facility] Pharmacist completed home visit med rec with patient yesterday.    Details are in the note---briefly, he does not have apixaban secondary to cost (patient assistance option limited since he has SSA Extra Help per [facility] phone call) Based on renal function and age, he would qualify for a dose reduction if used in setting of afib.   Dose reduction of metformin may also be warranted if eGFR remains 30-45.   Thanks for your time.   Karrie Meres, PharmD, [place] [phone]

## 2017-08-31 ENCOUNTER — Other Ambulatory Visit: Payer: Self-pay | Admitting: Physician Assistant

## 2017-09-01 DIAGNOSIS — J449 Chronic obstructive pulmonary disease, unspecified: Secondary | ICD-10-CM | POA: Diagnosis not present

## 2017-09-03 NOTE — Telephone Encounter (Signed)
Refill appropriate 

## 2017-09-10 ENCOUNTER — Ambulatory Visit: Payer: Medicare Other | Admitting: Physician Assistant

## 2017-09-11 ENCOUNTER — Ambulatory Visit: Payer: Self-pay | Admitting: *Deleted

## 2017-09-11 ENCOUNTER — Other Ambulatory Visit: Payer: Self-pay | Admitting: *Deleted

## 2017-09-11 NOTE — Patient Outreach (Signed)
[facility]) Care Management  [place]  09/11/2017   Kraig DEVERON SHAMOON 09/29/1935 [id number]   Aldous ITZEL LOWRIMORE is an 82 y.o. male with medical history significant ofchronic respiratory failure on home oxygen, COPD, pulmonary fibrosis, chronic diastolic congestive heart failure, diabetes pneumonia, rapid atrial fibrillation and sepsis  Mr Boyd had been previously followed by [facility] CM services in 2017 and the last contact was made in January 2018. His [facility] consent form noted in EPIC. He was referred to this [facility] CM from [place], [place] for uncontrolled DM & knowledge deficit regarding   EPIC indicates 2 admissions in the last 6 months, both in June 2018.   Subjective: see notes below  Objective:  Encounter Medications:  Outpatient Encounter Medications as of 09/11/2017  Medication Sig  . albuterol (PROVENTIL) (2.5 MG/3ML) 0.083% nebulizer solution INHALE 1 VIAL VIA NEBULIZER EVERY 6 HOURS AS NEEDED FOR WHEEZING OR SHORTNESS OF BREATH  . apixaban (ELIQUIS) 2.5 MG TABS tablet Take 1 tablet (2.5 mg total) by mouth 2 (two) times daily.  . baclofen (LIORESAL) 10 MG tablet Take 10 mg by mouth 3 (three) times daily.  . budesonide (PULMICORT) 0.5 MG/2ML nebulizer solution Take 2 mLs (0.5 mg total) by nebulization 2 (two) times daily.  Marland Kitchen diltiazem (CARDIZEM CD) 240 MG 24 hr capsule Take 1 capsule (240 mg total) by mouth daily.  . furosemide (LASIX) 40 MG tablet Take 2 tablets (80 mg total) by mouth 3 (three) times daily.  . insulin NPH Human (NOVOLIN N) 100 UNIT/ML injection 30 units with breakfast daily and 30 units with supper daily  . ipratropium (ATROVENT) 0.02 % nebulizer solution Take 2.5 mLs (0.5 mg total) by nebulization 4 (four) times daily.  Marland Kitchen losartan (COZAAR) 100 MG tablet Take 1 tablet (100 mg total) by mouth daily.  . metFORMIN (GLUCOPHAGE) 500 MG tablet Take 1 tablet (500 mg total) by mouth daily with breakfast.  . metoprolol tartrate  (LOPRESSOR) 25 MG tablet TAKE 1 TABLET BY MOUTH TWICE DAILY  . mirtazapine (REMERON) 30 MG tablet TAKE 1 TABLET BY MOUTH AT BEDTIME (Patient not taking: Reported on 08/27/2017)  . omeprazole (PRILOSEC) 20 MG capsule Take 1 capsule (20 mg total) by mouth daily. (Patient not taking: Reported on 08/27/2017)  . ONE TOUCH ULTRA TEST test strip USE TO CHECK BLOOD SUGAR EACH MORNING AND THEN 2 HOURS AFTER ANY MEAL  . ONETOUCH DELICA LANCETS 22W MISC USE TO CHECK BLOOD SUGAR TWICE DAILY AS DIRECTED  . potassium chloride SA (K-DUR,KLOR-CON) 20 MEQ tablet Take 2 tablets 3 times a day  . pravastatin (PRAVACHOL) 80 MG tablet Take 1 tablet (80 mg total) by mouth at bedtime.   No facility-administered encounter medications on file as of 09/11/2017.     Functional Status:  In your present state of health, do you have any difficulty performing the following activities: 03/04/2017 02/25/2017  Hearing? N N  Vision? N N  Difficulty concentrating or making decisions? Y N  Walking or climbing stairs? Y Y  Dressing or bathing? Y N  Doing errands, shopping? N N  Some recent data might be hidden    Fall/Depression Screening: Fall Risk  08/15/2017 07/20/2017 06/20/2017  Falls in the past year? No No No   PHQ 2/9 Scores 08/15/2017 07/20/2017 06/20/2017 07/27/2016 03/16/2016 02/09/2015 12/01/2013  PHQ - 2 Score 0 0 0 0 0 1 0  PHQ- 9 Score 0 - 0 - - - -    Assessment:  Care coordination/telephonic assessment- call to Mr kirt chew (inclement weather) Mr Spieler answered. He states he is doing well today and has not been out of his home since last week.  He reports he has his wife and other family present.  He reports being safe with food and medications. He reports having a pcp appointment that the pcp office rescheduled for 09/12/17  CHF He reports not weighing today but has noted "swelling of my feet come back"  He recently finished a dose of antibiotics for "infection"  Diabetes got all medication  cbgs good No eye exam Has glasses Had eye surgery 2-3 years ago Does not like wearing his glasses "I can't "I do know how take care of myself", "They ain't told me nothing" when inquired about DM 2 education   Educated on the 4 types of diabetes, discussed his type diabetes in detail He reports he has been told to check his cbg "two times a day" but he expresses frustration because  "at night no matter what I do it is allows three to four hundred".  He reports not checking at intervals because  "I don't want to know " the values. He voices frustration because he feels "There is nothing I can do to make it better. " I have tried so many things.  I done got aggrevated" [facility] CM discussed stress and illness related elevations in cbgs "I stay pissed off" "I don't want to talk to anyone about mine." when CM offered to assist with resources for stress   Recurrent Cellulitis/sepsis-Mr Banka reports, "my feet are still swelling. They got infection in them now.' "When I run out of antibiotics, it just does the same thing" He reports pain in his legs, "they hurt" CM discussed specialists for diabetes and infections.  When Cm inquired about potential referrals to specialists his response was, "they cost too much money"  Pt agreeing to give specialists "a try". CM reviewed his [place] health care ([employer])AARP medicare complete plan 2 (HMO) card to discuss his in network and out of network co pays as $35 an $50 Cm discussed the importance of seeing and in network provider.  Mr Woolum concluded the call because he stated he ws "getting ready to eat" He reports he has not received his [employer] [facility] [employer] health care catalog yet  Plan  Discussed with Mr Concannon that his compliance with plan of care is very important for [facility] CM to continue community care services his response was "I don't care"  In basket note to [facility] about pt willing to work with specialist  Route note to [place]. [name], RN, BSN,  [place] Care Management [phone]

## 2017-09-12 ENCOUNTER — Encounter: Payer: Self-pay | Admitting: Physician Assistant

## 2017-09-12 ENCOUNTER — Other Ambulatory Visit: Payer: Self-pay

## 2017-09-12 ENCOUNTER — Ambulatory Visit (INDEPENDENT_AMBULATORY_CARE_PROVIDER_SITE_OTHER): Payer: Medicare Other | Admitting: Physician Assistant

## 2017-09-12 VITALS — BP 162/74 | HR 82 | Temp 97.8°F | Resp 16 | Wt 196.0 lb

## 2017-09-12 DIAGNOSIS — N183 Chronic kidney disease, stage 3 unspecified: Secondary | ICD-10-CM

## 2017-09-12 DIAGNOSIS — Z9114 Patient's other noncompliance with medication regimen: Secondary | ICD-10-CM

## 2017-09-12 DIAGNOSIS — R600 Localized edema: Secondary | ICD-10-CM

## 2017-09-12 DIAGNOSIS — I10 Essential (primary) hypertension: Secondary | ICD-10-CM | POA: Diagnosis not present

## 2017-09-12 DIAGNOSIS — Z794 Long term (current) use of insulin: Secondary | ICD-10-CM | POA: Diagnosis not present

## 2017-09-12 DIAGNOSIS — J439 Emphysema, unspecified: Secondary | ICD-10-CM | POA: Diagnosis not present

## 2017-09-12 DIAGNOSIS — L03116 Cellulitis of left lower limb: Secondary | ICD-10-CM

## 2017-09-12 DIAGNOSIS — J8410 Pulmonary fibrosis, unspecified: Secondary | ICD-10-CM | POA: Diagnosis not present

## 2017-09-12 DIAGNOSIS — E1165 Type 2 diabetes mellitus with hyperglycemia: Secondary | ICD-10-CM | POA: Diagnosis not present

## 2017-09-12 MED ORDER — CEPHALEXIN 500 MG PO CAPS: 500.0000 mg | ORAL_CAPSULE | Freq: Four times a day (QID) | ORAL | 0 refills | Status: AC

## 2017-09-12 NOTE — Progress Notes (Signed)
Patient ID: Edward [name] MRN: [id number], DOB: 01/21/1935, 82 y.o. Date of Encounter: @DATE @  Chief Complaint:  Chief Complaint  Patient presents with  . 3 month follow up    HPI: 82 y.o. year old male  presents for routine f/u OV.   He reports that his left calf has been itching like the right calf had done in the past.  In the past the right calf had some pink coloration and similar appearance as what the left calf appears now and in the past I prescribed antibiotics and the itching and pinkness resolved (cellulitis). Also, I recently received a staff message from staff who have been doing home visits.  They reported the above symptoms to me and that patient was scheduling visit with me to evaluate this.  That note reported that they had talked to Edward [name] about seeing a specialist such as infectious disease or endocrinologist and the cost for that visit/co-pay.  I discussed this with Edward [name] today.  He states that he does not want to see any of these specialists, does not want to go to ID or Endo "I do not have the money right now for that ".  He has no other specific concerns today.  As documented with previous visits, he has history of noncompliance and even when I tried to make sure that he is going home and taking medications correctly, has still had issues with this.  Even with having [facility] and other support go do home visits, compliance has continued to be an issue. Reports that he is taking medications for diabetes as directed. Reports that he is taking blood pressure medications as directed. Reports that he is taking "fluid pill "as directed.   Past Medical History:  Diagnosis Date  . Allergy    Rhinitis  . Bronchitis   . Chronic respiratory failure ([place])   . Colon polyps   . COPD (chronic obstructive pulmonary disease) ([place])   . Diabetes mellitus   . Elevated lipids   . Hypercholesterolemia   . Hypertension   . On home O2    2L N/C   . PSA elevation   .  Pulmonary fibrosis ([place])   . Vitamin D deficiency      Home Meds: Outpatient Medications Prior to Visit  Medication Sig Dispense Refill  . albuterol (PROVENTIL) (2.5 MG/3ML) 0.083% nebulizer solution INHALE 1 VIAL VIA NEBULIZER EVERY 6 HOURS AS NEEDED FOR WHEEZING OR SHORTNESS OF BREATH 360 mL 5  . apixaban (ELIQUIS) 2.5 MG TABS tablet Take 1 tablet (2.5 mg total) by mouth 2 (two) times daily. 60 tablet 2  . baclofen (LIORESAL) 10 MG tablet Take 10 mg by mouth 3 (three) times daily.    . budesonide (PULMICORT) 0.5 MG/2ML nebulizer solution Take 2 mLs (0.5 mg total) by nebulization 2 (two) times daily. 120 mL 5  . diltiazem (CARDIZEM CD) 240 MG 24 hr capsule Take 1 capsule (240 mg total) by mouth daily. 30 capsule 3  . furosemide (LASIX) 40 MG tablet Take 2 tablets (80 mg total) by mouth 3 (three) times daily. 120 tablet 2  . insulin NPH Human (NOVOLIN N) 100 UNIT/ML injection 30 units with breakfast daily and 30 units with supper daily 10 mL 1  . ipratropium (ATROVENT) 0.02 % nebulizer solution Take 2.5 mLs (0.5 mg total) by nebulization 4 (four) times daily. 25 mL 12  . losartan (COZAAR) 100 MG tablet Take 1 tablet (100 mg total) by mouth daily. 30 tablet  1  . metoprolol tartrate (LOPRESSOR) 25 MG tablet TAKE 1 TABLET BY MOUTH TWICE DAILY 60 tablet 0  . mirtazapine (REMERON) 30 MG tablet TAKE 1 TABLET BY MOUTH AT BEDTIME 30 tablet 5  . omeprazole (PRILOSEC) 20 MG capsule Take 1 capsule (20 mg total) by mouth daily. 30 capsule 3  . ONE TOUCH ULTRA TEST test strip USE TO CHECK BLOOD SUGAR EACH MORNING AND THEN 2 HOURS AFTER ANY MEAL 100 each 2  . ONETOUCH DELICA LANCETS 84O MISC USE TO CHECK BLOOD SUGAR TWICE DAILY AS DIRECTED 100 each 2  . potassium chloride SA (K-DUR,KLOR-CON) 20 MEQ tablet Take 2 tablets 3 times a day 180 tablet 3  . pravastatin (PRAVACHOL) 80 MG tablet Take 1 tablet (80 mg total) by mouth at bedtime. 30 tablet 1  . metFORMIN (GLUCOPHAGE) 500 MG tablet Take 1 tablet (500 mg  total) by mouth daily with breakfast. 90 tablet 1   No facility-administered medications prior to visit.     Allergies:  Allergies  Allergen Reactions  . Ace Inhibitors Other (See Comments)    Hyperkalemia--07/23/2013:patient states not familiar with the following allergy    Social History   Socioeconomic History  . Marital status: Married    Spouse name: Not on file  . Number of children: Not on file  . Years of education: Not on file  . Highest education level: Not on file  Social Needs  . Financial resource strain: Not on file  . Food insecurity - worry: Not on file  . Food insecurity - inability: Not on file  . Transportation needs - medical: Not on file  . Transportation needs - non-medical: Not on file  Occupational History  . 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 Smoking status: Former Smoker    Packs/day: 1.50    Years: 60.00    Pack years: 90.00    Types: Cigarettes    Last attempt to quit: 12/31/2012    Years since quitting: 4.7  . Smokeless tobacco: Never Used  Substance and Sexual Activity  . Alcohol use: No  . Drug use: No  . Sexual activity: Yes    Birth control/protection: None  Other Topics Concern  . Not on file  Social History Narrative  . Not on file    Family History  Problem Relation Age of Onset  . Heart disease Mother   . CAD Other   . Diabetes Other      Review of Systems:  See HPI for pertinent ROS. All other ROS negative.    Physical Exam: Blood pressure (!) 162/74, pulse 82, temperature 97.8 F (36.6 C), temperature source Oral, resp. rate 16, weight 88.9 kg (196 lb), SpO2 95 %., Body mass index is 32.62 kg/m. General: WM. Appears in no acute distress. Neck: Supple. No thyromegaly. No lymphadenopathy. Lungs: Distant, decreased breath sounds throughout bilaterally.  Minimal wheezing scattered throughout bilaterally.  Some rhonchi/rales at bases bilaterally. Heart: RRR with S1 S2. No murmurs, rubs,  or gallops. Abdomen: Soft, non-tender, non-distended with normoactive bowel sounds. No hepatomegaly. No rebound/guarding. No obvious abdominal masses. Musculoskeletal:  Strength and tone normal for age. Extremities/Skin: Left calf is light pink in coloration. Left calf with more swelling than the right calf.  Neuro: Alert and oriented X 3. Moves all extremities spontaneously. Gait is normal. CNII-XII grossly in tact. Psych:  Responds to questions appropriately with a normal affect.     ASSESSMENT AND PLAN:  82 y.o. year  old male with   Non compliance w medication regimen He has a history of repetitive noncompliance. I feel quite certain that he is not taking his Lasix as directed. Noted that his abdomen looks more protuberant than usual.  His lungs sound like they have more rhonchi/rales than usual.  Has lower extremity edema on exam. Then reviewed his weights and he is weight is up significantly. Weight today 196 pounds.---------------- all of these recorded weights are on the same set of digital scales here in our office. 08/15/17---------- 1 8 9  pounds 07/25/17------- 182 pounds 07/19/17----------- 176 pounds ------Weight today is up 13 pounds compared to 07/19/17.------------------ I have marked the word Lasix/furosemide on his AVS and told him to go home and make sure he is taking this medication.  Do not know what else to do.  Had him come in to my office for frequent visits and this did not work even had his wife come with him to those visits.  More recently I have had a [facility] and other outpatient services go to his home and this still is not working.  Also of note today he brings in a blood sugar log sheet and gives that to me.  I reviewed the on top of it it says for September 2018 and that it has dates listed are all from September ----when I said this to him his response was that these are recent readings that they just do not want up with the dates. - COMPLETE METABOLIC PANEL WITH  GFR  Cellulitis of leg, left He refuses follow-up with any type of specialists including infectious disease or endocrinology.  He has had similar appearance of cellulitis in the past but was on the right calf in the past.  In the past it has resolved with antibiotics but then reoccurs.  Will treat with another round of antibiotics at this time. - cephALEXin (KEFLEX) 500 MG capsule; Take 1 capsule (500 mg total) by mouth 4 (four) times daily for 7 days.  Dispense: 28 capsule; Refill: 0  Essential hypertension Blood Pressure is elevated today.  However suspect noncompliance as stated above so we will not adjust medicines.  Will check lab to monitor. - COMPLETE METABOLIC PANEL WITH GFR  Pulmonary emphysema, unspecified emphysema type ([place]) Continue current treatment.  At recent office visits he had also been noncompliant with use of pulmonary meds.  Pulmonary fibrosis ([place]) Continue current treatment.  At recent office visits he had also been noncompliant with use of pulmonary meds.  Uncontrolled type 2 diabetes mellitus with hyperglycemia, with long-term current use of insulin ([name]) Also of note today he brings in a blood sugar log sheet and gives that to me.  I reviewed the on top of it it says for September 2018 and that it has dates listed are all from September ----when I said this to him his response was that these are recent readings that they just do not want up with the dates. Hands me a blood sugar log form that states "September 2018" at top. On the left side it has dates for the month of September such as 9/6--- in the colon for before breakfast has documented 107----then in the colon for before lunch has documented 150--- then at bedtime column Has 287 He has such information/readings documented for multiple dates almost every single day from 9/6 down to 9/23. However, acts like these readings are from December.  Has done similar in the past and have been quite certain that he is  illiterate but then he will never be honest about this.  - Microalbumin, urine - COMPLETE METABOLIC PANEL WITH GFR - Hemoglobin A1c  CKD (chronic kidney disease), stage III ([name]) - COMPLETE METABOLIC PANEL WITH GFR   Bilateral lower extremity edema See notes above - COMPLETE METABOLIC PANEL WITH GFR   Take Keflex as directed.  Follow-up if left leg worsens. He is to make sure that he is taking the Lasix as directed.  He is supposed to be taking all medications as directed but specifically definitely needs to check on the Lasix. Metformin has been removed from list given CKD and have told him to make sure he has stopped this.  Routine follow-up visit 3 months.  Follow-up sooner if needed.  [address] [place], [place], [facility] 09/12/2017 4:11 PM

## 2017-09-13 LAB — COMPLETE METABOLIC PANEL WITH GFR
AG Ratio: 1.4 (calc) (ref 1.0–2.5)
ALKALINE PHOSPHATASE (APISO): 81 U/L (ref 40–115)
ALT: 11 U/L (ref 9–46)
AST: 14 U/L (ref 10–35)
Albumin: 4 g/dL (ref 3.6–5.1)
BUN/Creatinine Ratio: 27 (calc) — ABNORMAL HIGH (ref 6–22)
BUN: 37 mg/dL — ABNORMAL HIGH (ref 7–25)
CO2: 31 mmol/L (ref 20–32)
CREATININE: 1.36 mg/dL — AB (ref 0.70–1.11)
Calcium: 9.9 mg/dL (ref 8.6–10.3)
Chloride: 95 mmol/L — ABNORMAL LOW (ref 98–110)
GFR, Est African American: 57 mL/min/{1.73_m2} — ABNORMAL LOW (ref >=60)
GFR, Est Non African American: 49 mL/min/{1.73_m2} — ABNORMAL LOW (ref >=60)
GLOBULIN: 2.9 g/dL (ref 1.9–3.7)
Glucose, Bld: 200 mg/dL — ABNORMAL HIGH (ref 65–99)
POTASSIUM: 4.9 mmol/L (ref 3.5–5.3)
SODIUM: 136 mmol/L (ref 135–146)
Total Bilirubin: 0.3 mg/dL (ref 0.2–1.2)
Total Protein: 6.9 g/dL (ref 6.1–8.1)

## 2017-09-13 LAB — MICROALBUMIN, URINE: MICROALB UR: 46.5 mg/dL

## 2017-09-13 LAB — HEMOGLOBIN A1C
HEMOGLOBIN A1C: 12.2 %{Hb} — AB (ref <5.7)
MEAN PLASMA GLUCOSE: 303 (calc)
eAG (mmol/L): 16.8 (calc)

## 2017-09-18 ENCOUNTER — Other Ambulatory Visit: Payer: Self-pay | Admitting: Physician Assistant

## 2017-09-20 ENCOUNTER — Ambulatory Visit: Payer: Self-pay | Admitting: Physician Assistant

## 2017-09-22 ENCOUNTER — Encounter (HOSPITAL_COMMUNITY): Payer: Self-pay | Admitting: Emergency Medicine

## 2017-09-22 ENCOUNTER — Inpatient Hospital Stay (HOSPITAL_COMMUNITY)
Admission: EM | Admit: 2017-09-22 | Discharge: 2017-09-24 | DRG: 291 | Disposition: A | Discharge status: Home / Self Care | Payer: Medicare Other | Attending: Family Medicine | Admitting: Family Medicine

## 2017-09-22 ENCOUNTER — Other Ambulatory Visit: Payer: Self-pay

## 2017-09-22 ENCOUNTER — Emergency Department (HOSPITAL_COMMUNITY): Payer: Medicare Other

## 2017-09-22 DIAGNOSIS — Z87891 Personal history of nicotine dependence: Secondary | ICD-10-CM

## 2017-09-22 DIAGNOSIS — Z794 Long term (current) use of insulin: Secondary | ICD-10-CM

## 2017-09-22 DIAGNOSIS — Z833 Family history of diabetes mellitus: Secondary | ICD-10-CM

## 2017-09-22 DIAGNOSIS — E1122 Type 2 diabetes mellitus with diabetic chronic kidney disease: Secondary | ICD-10-CM | POA: Diagnosis not present

## 2017-09-22 DIAGNOSIS — I5033 Acute on chronic diastolic (congestive) heart failure: Secondary | ICD-10-CM | POA: Diagnosis present

## 2017-09-22 DIAGNOSIS — Z9114 Patient's other noncompliance with medication regimen: Secondary | ICD-10-CM

## 2017-09-22 DIAGNOSIS — I519 Heart disease, unspecified: Secondary | ICD-10-CM

## 2017-09-22 DIAGNOSIS — Z79899 Other long term (current) drug therapy: Secondary | ICD-10-CM

## 2017-09-22 DIAGNOSIS — K5900 Constipation, unspecified: Secondary | ICD-10-CM

## 2017-09-22 DIAGNOSIS — Z8249 Family history of ischemic heart disease and other diseases of the circulatory system: Secondary | ICD-10-CM | POA: Diagnosis not present

## 2017-09-22 DIAGNOSIS — I4891 Unspecified atrial fibrillation: Secondary | ICD-10-CM | POA: Diagnosis not present

## 2017-09-22 DIAGNOSIS — J439 Emphysema, unspecified: Secondary | ICD-10-CM

## 2017-09-22 DIAGNOSIS — N183 Chronic kidney disease, stage 3 unspecified: Secondary | ICD-10-CM | POA: Diagnosis present

## 2017-09-22 DIAGNOSIS — J9610 Chronic respiratory failure, unspecified whether with hypoxia or hypercapnia: Secondary | ICD-10-CM | POA: Diagnosis present

## 2017-09-22 DIAGNOSIS — Z7901 Long term (current) use of anticoagulants: Secondary | ICD-10-CM | POA: Diagnosis not present

## 2017-09-22 DIAGNOSIS — I130 Hypertensive heart and chronic kidney disease with heart failure and stage 1 through stage 4 chronic kidney disease, or unspecified chronic kidney disease: Secondary | ICD-10-CM | POA: Diagnosis not present

## 2017-09-22 DIAGNOSIS — E785 Hyperlipidemia, unspecified: Secondary | ICD-10-CM | POA: Diagnosis not present

## 2017-09-22 DIAGNOSIS — I480 Paroxysmal atrial fibrillation: Secondary | ICD-10-CM | POA: Diagnosis present

## 2017-09-22 DIAGNOSIS — I10 Essential (primary) hypertension: Secondary | ICD-10-CM | POA: Diagnosis not present

## 2017-09-22 DIAGNOSIS — R600 Localized edema: Secondary | ICD-10-CM

## 2017-09-22 DIAGNOSIS — Z961 Presence of intraocular lens: Secondary | ICD-10-CM | POA: Diagnosis present

## 2017-09-22 DIAGNOSIS — Z66 Do not resuscitate: Secondary | ICD-10-CM | POA: Diagnosis present

## 2017-09-22 DIAGNOSIS — Z9841 Cataract extraction status, right eye: Secondary | ICD-10-CM

## 2017-09-22 DIAGNOSIS — Z9981 Dependence on supplemental oxygen: Secondary | ICD-10-CM | POA: Diagnosis not present

## 2017-09-22 DIAGNOSIS — M7989 Other specified soft tissue disorders: Secondary | ICD-10-CM | POA: Diagnosis not present

## 2017-09-22 DIAGNOSIS — R1900 Intra-abdominal and pelvic swelling, mass and lump, unspecified site: Secondary | ICD-10-CM | POA: Diagnosis not present

## 2017-09-22 DIAGNOSIS — E1165 Type 2 diabetes mellitus with hyperglycemia: Secondary | ICD-10-CM | POA: Diagnosis not present

## 2017-09-22 DIAGNOSIS — Z9842 Cataract extraction status, left eye: Secondary | ICD-10-CM

## 2017-09-22 DIAGNOSIS — J449 Chronic obstructive pulmonary disease, unspecified: Secondary | ICD-10-CM | POA: Diagnosis not present

## 2017-09-22 DIAGNOSIS — R609 Edema, unspecified: Secondary | ICD-10-CM

## 2017-09-22 DIAGNOSIS — I5032 Chronic diastolic (congestive) heart failure: Secondary | ICD-10-CM | POA: Diagnosis present

## 2017-09-22 DIAGNOSIS — Z888 Allergy status to other drugs, medicaments and biological substances status: Secondary | ICD-10-CM | POA: Diagnosis not present

## 2017-09-22 DIAGNOSIS — Z09 Encounter for follow-up examination after completed treatment for conditions other than malignant neoplasm: Secondary | ICD-10-CM

## 2017-09-22 DIAGNOSIS — I5189 Other ill-defined heart diseases: Secondary | ICD-10-CM | POA: Diagnosis present

## 2017-09-22 LAB — I-STAT CHEM 8, ED
BUN: 25 mg/dL — ABNORMAL HIGH (ref 6–20)
CALCIUM ION: 1.11 mmol/L — AB (ref 1.15–1.40)
CREATININE: 1 mg/dL (ref 0.61–1.24)
Chloride: 97 mmol/L — ABNORMAL LOW (ref 101–111)
Glucose, Bld: 203 mg/dL — ABNORMAL HIGH (ref 65–99)
HEMATOCRIT: 29 % — AB (ref 39.0–52.0)
HEMOGLOBIN: 9.9 g/dL — AB (ref 13.0–17.0)
Potassium: 5 mmol/L (ref 3.5–5.1)
Sodium: 132 mmol/L — ABNORMAL LOW (ref 135–145)
TCO2: 28 mmol/L (ref 22–32)

## 2017-09-22 LAB — CBC WITH DIFFERENTIAL/PLATELET
BASOS ABS: 0 10*3/uL (ref 0.0–0.1)
Basophils Relative: 0 %
EOS ABS: 0.5 10*3/uL (ref 0.0–0.7)
EOS PCT: 5 %
HCT: 30.8 % — ABNORMAL LOW (ref 39.0–52.0)
Hemoglobin: 9.4 g/dL — ABNORMAL LOW (ref 13.0–17.0)
Lymphocytes Relative: 21 %
Lymphs Abs: 2.2 10*3/uL (ref 0.7–4.0)
MCH: 23.5 pg — AB (ref 26.0–34.0)
MCHC: 30.5 g/dL (ref 30.0–36.0)
MCV: 77 fL — ABNORMAL LOW (ref 78.0–100.0)
MONO ABS: 1.3 10*3/uL — AB (ref 0.1–1.0)
Monocytes Relative: 13 %
Neutro Abs: 6.4 10*3/uL (ref 1.7–7.7)
Neutrophils Relative %: 61 %
PLATELETS: 456 10*3/uL — AB (ref 150–400)
RBC: 4 MIL/uL — AB (ref 4.22–5.81)
RDW: 17.2 % — AB (ref 11.5–15.5)
WBC: 10.4 10*3/uL (ref 4.0–10.5)

## 2017-09-22 LAB — GLUCOSE, CAPILLARY: Glucose-Capillary: 153 mg/dL — ABNORMAL HIGH (ref 65–99)

## 2017-09-22 LAB — BRAIN NATRIURETIC PEPTIDE: B Natriuretic Peptide: 224 pg/mL — ABNORMAL HIGH (ref 0.0–100.0)

## 2017-09-22 LAB — I-STAT TROPONIN, ED: TROPONIN I, POC: 0.02 ng/mL (ref 0.00–0.08)

## 2017-09-22 MED ORDER — MIRTAZAPINE 30 MG PO TABS
30.0000 mg | ORAL_TABLET | Freq: Every day | ORAL | Status: DC
  Administered 2017-09-22 – 2017-09-23 (×2): 30 mg via ORAL
  Filled 2017-09-22 (×2): qty 1

## 2017-09-22 MED ORDER — FUROSEMIDE 10 MG/ML IJ SOLN: 80.0000 mg | Freq: Three times a day (TID) | INTRAMUSCULAR | Status: DC

## 2017-09-22 MED ORDER — INSULIN ASPART 100 UNIT/ML ~~LOC~~ SOLN
0.0000 [IU] | Freq: Three times a day (TID) | SUBCUTANEOUS | Status: DC
  Administered 2017-09-23: 2 [IU] via SUBCUTANEOUS
  Administered 2017-09-23: 3 [IU] via SUBCUTANEOUS

## 2017-09-22 MED ORDER — FUROSEMIDE 10 MG/ML IJ SOLN
40.0000 mg | Freq: Once | INTRAMUSCULAR | Status: AC
  Administered 2017-09-22: 40 mg via INTRAVENOUS
  Filled 2017-09-22: qty 4

## 2017-09-22 MED ORDER — BACLOFEN 10 MG PO TABS
10.0000 mg | ORAL_TABLET | Freq: Three times a day (TID) | ORAL | Status: DC
  Administered 2017-09-22 – 2017-09-24 (×5): 10 mg via ORAL
  Filled 2017-09-22 (×8): qty 1

## 2017-09-22 MED ORDER — DILTIAZEM LOAD VIA INFUSION
10.0000 mg | Freq: Once | INTRAVENOUS | Status: AC
  Administered 2017-09-22: 10 mg via INTRAVENOUS
  Filled 2017-09-22: qty 10

## 2017-09-22 MED ORDER — ALPRAZOLAM 0.25 MG PO TABS
0.2500 mg | ORAL_TABLET | Freq: Once | ORAL | Status: AC
  Administered 2017-09-22: 0.25 mg via ORAL
  Filled 2017-09-22: qty 1

## 2017-09-22 MED ORDER — LOSARTAN POTASSIUM 50 MG PO TABS
100.0000 mg | ORAL_TABLET | Freq: Every day | ORAL | Status: DC
  Administered 2017-09-22 – 2017-09-24 (×3): 100 mg via ORAL
  Filled 2017-09-22 (×3): qty 2

## 2017-09-22 MED ORDER — DM-GUAIFENESIN ER 30-600 MG PO TB12
1.0000 | ORAL_TABLET | Freq: Two times a day (BID) | ORAL | Status: DC
  Administered 2017-09-22 – 2017-09-24 (×4): 1 via ORAL
  Filled 2017-09-22 (×4): qty 1

## 2017-09-22 MED ORDER — METOPROLOL TARTRATE 25 MG PO TABS
25.0000 mg | ORAL_TABLET | Freq: Two times a day (BID) | ORAL | Status: DC
  Administered 2017-09-22 – 2017-09-24 (×4): 25 mg via ORAL
  Filled 2017-09-22 (×4): qty 1

## 2017-09-22 MED ORDER — ONDANSETRON HCL 4 MG/2ML IJ SOLN: 4.0000 mg | Freq: Four times a day (QID) | INTRAMUSCULAR | Status: DC | PRN

## 2017-09-22 MED ORDER — INSULIN NPH (HUMAN) (ISOPHANE) 100 UNIT/ML ~~LOC~~ SUSP
30.0000 [IU] | Freq: Two times a day (BID) | SUBCUTANEOUS | Status: DC
  Administered 2017-09-22: 30 [IU] via SUBCUTANEOUS
  Filled 2017-09-22: qty 10

## 2017-09-22 MED ORDER — SODIUM CHLORIDE 0.9% FLUSH: 3.0000 mL | INTRAVENOUS | Status: DC | PRN

## 2017-09-22 MED ORDER — SODIUM CHLORIDE 0.9 % IV SOLN: INTRAVENOUS | Status: DC

## 2017-09-22 MED ORDER — SODIUM CHLORIDE 0.9 % IV SOLN
Freq: Once | INTRAVENOUS | Status: AC
  Administered 2017-09-22: 17:00:00 via INTRAVENOUS

## 2017-09-22 MED ORDER — ACETAMINOPHEN 325 MG PO TABS: 650.0000 mg | ORAL_TABLET | ORAL | Status: DC | PRN

## 2017-09-22 MED ORDER — INSULIN ASPART 100 UNIT/ML ~~LOC~~ SOLN: 0.0000 [IU] | Freq: Every day | SUBCUTANEOUS | Status: DC

## 2017-09-22 MED ORDER — BUDESONIDE 0.5 MG/2ML IN SUSP
0.5000 mg | Freq: Two times a day (BID) | RESPIRATORY_TRACT | Status: DC
  Administered 2017-09-22 – 2017-09-24 (×4): 0.5 mg via RESPIRATORY_TRACT
  Filled 2017-09-22 (×4): qty 2

## 2017-09-22 MED ORDER — PANTOPRAZOLE SODIUM 40 MG PO TBEC
40.0000 mg | DELAYED_RELEASE_TABLET | Freq: Every day | ORAL | Status: DC
  Administered 2017-09-22 – 2017-09-24 (×3): 40 mg via ORAL
  Filled 2017-09-22 (×3): qty 1

## 2017-09-22 MED ORDER — ALBUTEROL SULFATE (2.5 MG/3ML) 0.083% IN NEBU
2.5000 mg | INHALATION_SOLUTION | Freq: Four times a day (QID) | RESPIRATORY_TRACT | Status: DC | PRN
  Administered 2017-09-22: 2.5 mg via RESPIRATORY_TRACT
  Filled 2017-09-22: qty 3

## 2017-09-22 MED ORDER — DILTIAZEM HCL-DEXTROSE 100-5 MG/100ML-% IV SOLN (PREMIX)
5.0000 mg/h | INTRAVENOUS | Status: DC
  Administered 2017-09-22: 5 mg/h via INTRAVENOUS
  Filled 2017-09-22: qty 100

## 2017-09-22 MED ORDER — POTASSIUM CHLORIDE CRYS ER 20 MEQ PO TBCR
20.0000 meq | EXTENDED_RELEASE_TABLET | Freq: Three times a day (TID) | ORAL | Status: DC
  Administered 2017-09-22 – 2017-09-24 (×5): 20 meq via ORAL
  Filled 2017-09-22 (×5): qty 1

## 2017-09-22 MED ORDER — DILTIAZEM HCL-DEXTROSE 100-5 MG/100ML-% IV SOLN (PREMIX)
5.0000 mg/h | INTRAVENOUS | Status: DC
  Administered 2017-09-22: 5 mg/h via INTRAVENOUS

## 2017-09-22 MED ORDER — BACLOFEN 10 MG PO TABS
ORAL_TABLET | ORAL | Status: AC
  Filled 2017-09-22: qty 1

## 2017-09-22 MED ORDER — PRAVASTATIN SODIUM 40 MG PO TABS
80.0000 mg | ORAL_TABLET | Freq: Every day | ORAL | Status: DC
  Administered 2017-09-22 – 2017-09-23 (×2): 80 mg via ORAL
  Filled 2017-09-22 (×2): qty 2

## 2017-09-22 MED ORDER — SODIUM CHLORIDE 0.9 % IV SOLN: 250.0000 mL | INTRAVENOUS | Status: DC | PRN

## 2017-09-22 MED ORDER — APIXABAN 2.5 MG PO TABS
2.5000 mg | ORAL_TABLET | Freq: Two times a day (BID) | ORAL | Status: DC
  Administered 2017-09-22 – 2017-09-24 (×4): 2.5 mg via ORAL
  Filled 2017-09-22 (×4): qty 1

## 2017-09-22 MED ORDER — FUROSEMIDE 10 MG/ML IJ SOLN
20.0000 mg | Freq: Once | INTRAMUSCULAR | Status: AC
  Administered 2017-09-22: 20 mg via INTRAVENOUS
  Filled 2017-09-22: qty 2

## 2017-09-22 MED ORDER — SODIUM CHLORIDE 0.9% FLUSH
3.0000 mL | Freq: Two times a day (BID) | INTRAVENOUS | Status: DC
  Administered 2017-09-22 – 2017-09-23 (×3): 3 mL via INTRAVENOUS

## 2017-09-22 NOTE — ED Triage Notes (Signed)
Pt c/o of increased swelling in both lower legs with redness and ABD swelling. Pt was recently on fluid medicine but taken off for unknown reason. HR 145 in triage.

## 2017-09-22 NOTE — H&P (Signed)
History and Physical  Edward [name] PPJ:[id number] DOB: 01/17/1935 DOA: 09/22/2017  Referring physician: Dr Peyton Bottoms, ED physician PCP: Orlena Sheldon, PA-C  Outpatient Specialists: none  Patient Coming From: home  Chief Complaint: Leg swelling, shortness of breath  HPI: Edward [name] is a 82 y.o. male with a history of diabetes type 2 on insulin, grade 2 diastolic heart failure on echocardiogram 02/26/2017 with preserved EF, chronic respiratory failure on oxygen at home, COPD, hyperlipidemia, hypertension, atrial fibrillation on anticoagulation with a chads 2 vascular score of 5.  Patient saw his primary care provider in the middle of December (09/12/17).  The patient states that his PCP took him off his Lasix and his diabetes medication, however per her notes it appears that she only stopped his metformin.  She was uncertain of whether he was taking the Lasix.  Since that appointment, the patient has not taken his Lasix and began to have worsening breathing and worsening leg swelling.  He presented to the hospital for evaluation today.  Respiratory status worse with exertion and improved with rest.  Emergency Department Course: Chest x-ray shows pulmonary congestion.  Patient given Lasix in the ED (20 mg IV).  He was noted to be in A. fib with RVR and was started on Cardizem drip.  Per the patient he has been taking his blood pressure medications including his Cardizem  Review of Systems:   Pt denies any fevers, chills, nausea, vomiting, diarrhea, constipation, abdominal pain, cough, wheezing, palpitations, headache, vision changes, lightheadedness, dizziness, melena, rectal bleeding.  Review of systems are otherwise negative  Past Medical History:  Diagnosis Date  . Allergy    Rhinitis  . Bronchitis   . Chronic respiratory failure ([place])   . Colon polyps   . COPD (chronic obstructive pulmonary disease) ([place])   . Diabetes mellitus   . Elevated lipids   . Hypercholesterolemia   .  Hypertension   . On home O2    2L N/C   . PSA elevation   . Pulmonary fibrosis ([place])   . Vitamin D deficiency    Past Surgical History:  Procedure Laterality Date  . CATARACT EXTRACTION W/PHACO  06/25/2012   Procedure: CATARACT EXTRACTION PHACO AND INTRAOCULAR LENS PLACEMENT (IOC);  Surgeon: Elta Guadeloupe T. Gershon Crane, MD;  Location: [facility];  Service: Ophthalmology;  Laterality: Left;  CDE=19.01  . CATARACT EXTRACTION W/PHACO  07/09/2012   Procedure: CATARACT EXTRACTION PHACO AND INTRAOCULAR LENS PLACEMENT (IOC);  Surgeon: Elta Guadeloupe T. Gershon Crane, MD;  Location: [facility];  Service: Ophthalmology;  Laterality: Right;  CDE: 20.09   Social History:  reports that he quit smoking about 4 years ago. His smoking use included cigarettes. He has a 90.00 pack-year smoking history. he has never used smokeless tobacco. He reports that he does not drink alcohol or use drugs. Patient lives at home  Allergies  Allergen Reactions  . Ace Inhibitors Other (See Comments)    Hyperkalemia--07/23/2013:patient states not familiar with the following allergy    Family History  Problem Relation Age of Onset  . Heart disease Mother   . CAD Other   . Diabetes Other       Prior to Admission medications   Medication Sig Start Date End Date Taking? Authorizing Provider  apixaban (ELIQUIS) 2.5 MG TABS tablet Take 1 tablet (2.5 mg total) by mouth 2 (two) times daily. 08/30/17  Yes Dena Billet B, PA-C  furosemide (LASIX) 40 MG tablet Take 2 tablets (80 mg total) by mouth 3 (three) times daily.  07/19/17 07/19/18 Yes Dena Billet B, PA-C  metoprolol tartrate (LOPRESSOR) 25 MG tablet TAKE 1 TABLET BY MOUTH TWICE DAILY 09/03/17  Yes Dixon, Mary B, PA-C  NOVOLIN N RELION 100 UNIT/ML injection INJECT 30 UNITS SUBCUTANEOUSLY WITH BREAKFAST AND 30 UNITS WITH SUPPER DAILY Patient taking differently: INJECT 30 UNITS SUBCUTANEOUSLY AT BEDTIME 09/19/17  Yes Dixon, Stanton Kidney B, PA-C  OXYGEN Inhale 2 L into the lungs daily.   Yes [provider]    albuterol (PROVENTIL) (2.5 MG/3ML) 0.083% nebulizer solution INHALE 1 VIAL VIA NEBULIZER EVERY 6 HOURS AS NEEDED FOR WHEEZING OR SHORTNESS OF BREATH 07/19/17   Dena Billet B, PA-C  baclofen (LIORESAL) 10 MG tablet Take 10 mg by mouth 3 (three) times daily.    [provider]  budesonide (PULMICORT) 0.5 MG/2ML nebulizer solution Take 2 mLs (0.5 mg total) by nebulization 2 (two) times daily. 07/19/17   Orlena Sheldon, PA-C  diltiazem (CARDIZEM CD) 240 MG 24 hr capsule Take 1 capsule (240 mg total) by mouth daily. 07/19/17   Dena Billet B, PA-C  ipratropium (ATROVENT) 0.02 % nebulizer solution Take 2.5 mLs (0.5 mg total) by nebulization 4 (four) times daily. 07/19/17   Orlena Sheldon, PA-C  losartan (COZAAR) 100 MG tablet Take 1 tablet (100 mg total) by mouth daily. 07/19/17   Dena Billet B, PA-C  mirtazapine (REMERON) 30 MG tablet TAKE 1 TABLET BY MOUTH AT BEDTIME 06/28/17   Dena Billet B, PA-C  omeprazole (PRILOSEC) 20 MG capsule Take 1 capsule (20 mg total) by mouth daily. 03/23/16   Dena Billet B, PA-C  ONE TOUCH ULTRA TEST test strip USE TO CHECK BLOOD SUGAR EACH MORNING AND THEN 2 HOURS AFTER ANY MEAL 04/23/17   Dixon, Stanton Kidney B, PA-C  ONETOUCH DELICA LANCETS 95A MISC USE TO CHECK BLOOD SUGAR TWICE DAILY AS DIRECTED 03/20/17   Dena Billet B, PA-C  potassium chloride SA (K-DUR,KLOR-CON) 20 MEQ tablet Take 2 tablets 3 times a day 07/23/17   Dena Billet B, PA-C  pravastatin (PRAVACHOL) 80 MG tablet Take 1 tablet (80 mg total) by mouth at bedtime. 07/19/17   Orlena Sheldon, PA-C    Physical Exam: BP (!) 146/90   Pulse (!) 104   Temp 98.5 F (36.9 C) (Oral)   Resp (!) 22   Ht 5' (1.524 m)   Wt 88.9 kg (196 lb)   SpO2 99%   BMI 38.28 kg/m   General: Elderly Caucasian male. Awake and alert and oriented x3. No acute cardiopulmonary distress.  HEENT: Normocephalic atraumatic.  Right and left ears normal in appearance.  Pupils equal, round, reactive to light. Extraocular muscles are intact.  Sclerae anicteric and noninjected.  Moist mucosal membranes. No mucosal lesions.  Neck: Neck supple without lymphadenopathy. No carotid bruits. No masses palpated.  Cardiovascular: Irregular tachycardic rate. No murmurs, rubs, gallops auscultated. JVD difficult to appreciate due to body habitus.  Respiratory: Diminished breath sounds.  Rales in bases bilaterally.  Poor respiratory efforts.  No accessory muscle use.  Slight wheeze.  No accessory muscle use. Abdomen: Soft, nontender.  Abdomen distended.  Active bowel sounds. No masses or hepatosplenomegaly  Skin: No rashes, lesions, or ulcerations.  Dry, warm to touch. 2+ dorsalis pedis and radial pulses. Musculoskeletal: 3+ edema in legs bilaterally. No calf or leg pain. All major joints not erythematous nontender.  No upper or lower joint deformation.  Good ROM.  No contractures  Psychiatric: Intact judgment and insight. Pleasant and cooperative. Neurologic: No focal neurological deficits.  Strength is 5/5 and symmetric in upper and lower extremities.  Cranial nerves II through XII are grossly intact.           Labs on Admission: I have personally reviewed following labs and imaging studies  CBC: Recent Labs  Lab 09/22/17 1608 09/22/17 1628  WBC 10.4  --   NEUTROABS 6.4  --   HGB 9.4* 9.9*  HCT 30.8* 29.0*  MCV 77.0*  --   PLT 456*  --    Basic Metabolic Panel: Recent Labs  Lab 09/22/17 1628  NA 132*  K 5.0  CL 97*  GLUCOSE 203*  BUN 25*  CREATININE 1.00   GFR: Estimated Creatinine Clearance: 54.7 mL/min (by C-G formula based on SCr of 1 mg/dL). Liver Function Tests: No results for input(s): AST, ALT, ALKPHOS, BILITOT, PROT, ALBUMIN in the last 168 hours. No results for input(s): LIPASE, AMYLASE in the last 168 hours. No results for input(s): AMMONIA in the last 168 hours. Coagulation Profile: No results for input(s): INR, PROTIME in the last 168 hours. Cardiac Enzymes: No results for input(s): CKTOTAL, CKMB, CKMBINDEX,  TROPONINI in the last 168 hours. BNP (last 3 results) No results for input(s): PROBNP in the last 8760 hours. HbA1C: No results for input(s): HGBA1C in the last 72 hours. CBG: No results for input(s): GLUCAP in the last 168 hours. Lipid Profile: No results for input(s): CHOL, HDL, LDLCALC, TRIG, CHOLHDL, LDLDIRECT in the last 72 hours. Thyroid Function Tests: No results for input(s): TSH, T4TOTAL, FREET4, T3FREE, THYROIDAB in the last 72 hours. Anemia Panel: No results for input(s): VITAMINB12, FOLATE, FERRITIN, TIBC, IRON, RETICCTPCT in the last 72 hours. Urine analysis:    Component Value Date/Time   COLORURINE YELLOW 02/25/2017 1000   APPEARANCEUR CLEAR 02/25/2017 1000   LABSPEC <1.005 (L) 02/25/2017 1000   PHURINE 5.5 02/25/2017 1000   GLUCOSEU >=500 (A) 02/25/2017 1000   HGBUR TRACE (A) 02/25/2017 1000   BILIRUBINUR NEGATIVE 02/25/2017 1000   KETONESUR NEGATIVE 02/25/2017 1000   PROTEINUR TRACE (A) 02/25/2017 1000   NITRITE NEGATIVE 02/25/2017 1000   LEUKOCYTESUR NEGATIVE 02/25/2017 1000   Sepsis Labs: @LABRCNTIP (procalcitonin:4,lacticidven:4) )No results found for this or any previous visit (from the past 240 hour(s)).   Radiological Exams on Admission: Dg Chest Port 1 View  Result Date: 09/22/2017 CLINICAL DATA:  Bilateral lower extremity swelling and redness as well as abdominal swelling. EXAM: PORTABLE CHEST 1 VIEW COMPARISON:  03/04/2017 and 07/24/2016 FINDINGS: Lungs are adequately inflated with prominence of the perihilar markings with slightly more confluent opacification in the right infrahilar region. No evidence of effusion. Cardiomediastinal silhouette is within normal. There is minimal calcified plaque over the aortic arch. Remainder of the exam is unchanged. IMPRESSION: Findings suggesting mild vascular congestion. Slightly more confluent opacification in the right infrahilar region as early infection is possible. Electronically Signed   By: Marin Olp M.D.    On: 09/22/2017 16:45    EKG: Independently reviewed.  A. fib with RVR -ventricular rate in the 170s.  Assessment/Plan: Principal Problem:   Atrial fibrillation with RVR ([name]) Active Problems:   COPD (chronic obstructive pulmonary disease) ([name])   Hypertension   Chronic diastolic heart failure ([place])   Uncontrolled type 2 diabetes mellitus with hyperglycemia, with long-term current use of insulin ([name])   CKD (chronic kidney disease), stage III [facility])    This patient was discussed with the ED physician, including pertinent vitals, physical exam findings, labs, and imaging.  We also discussed care given by the  ED provider.  1.  A. fib with RVR  Observation in stepdown  Cardizem drip -transition to oral meds when meets criteria 2.  Diastolic heart failure Telemetry monitoring Strict I/O Daily Weights Diuresis: Lasix 60mg  IV Potassium: 20 mEq twice a day by mouth Echo cardiac exam tomorrow Repeat BMP tomorrow 3.  Uncontrolled diabetes  Home insulin with sliding scale and CBGs before meals and nightly 4.  CKD stage III  Creatinine improved 5.  Hypertension  Continue home medications 6.  COPD  Continue inhalers   DVT prophylaxis: On anticoagulation Consultants: None Code Status: Full code Family Communication: None Disposition Plan: Home following hospital stay   Truett Mainland, DO [facility] Pager [phone]  If 7PM-7AM, please contact night-coverage [email or web address] Password [email or web address]

## 2017-09-22 NOTE — ED Provider Notes (Signed)
[facility] EMERGENCY DEPARTMENT Provider Note   CSN: [id number] Arrival date & time: 09/22/17  1547     History   Chief Complaint Chief Complaint  Patient presents with  . Leg Swelling    HPI Edward [name] is a 82 y.o. male.  He presents for evaluation of redness and swelling in both legs and swelling in the abdomen.  He states the symptoms started several months ago.  He last saw his PCP about 2 weeks ago but he cannot remember what for.  He is taking his usual medications.  He states that his PCP stopped his fluid medication and medications for diabetes, recently.  He denies shortness of breath, palpitations, chest pain, weakness or dizziness.  He has not had any cough, fever or chills.  There is been no change in his urine output.  He denies dysuria or constipation.  There are no other known modifying factors.   HPI  Past Medical History:  Diagnosis Date  . Allergy    Rhinitis  . Bronchitis   . Chronic respiratory failure ([place])   . Colon polyps   . COPD (chronic obstructive pulmonary disease) ([place])   . Diabetes mellitus   . Elevated lipids   . Hypercholesterolemia   . Hypertension   . On home O2    2L N/C   . PSA elevation   . Pulmonary fibrosis ([place])   . Vitamin D deficiency     Patient Active Problem List   Diagnosis Date Noted  . Bilateral lower extremity edema 06/27/2017  . Insomnia 03/28/2017  . Uncontrolled type 2 diabetes mellitus with hyperglycemia, with long-term current use of insulin ([place]) 03/07/2017  . CKD (chronic kidney disease), stage III ([place]) 03/07/2017  . Lobar pneumonia ([place]) 03/07/2017  . Palliative care encounter   . Goals of care, counseling/discussion   . Encounter for hospice care discussion   . AF (paroxysmal atrial fibrillation) ([place]) 03/05/2017  . Acute metabolic encephalopathy 94/50/3888  . Pneumonia 03/04/2017  . Chronic diastolic CHF (congestive heart failure) ([place]) 03/04/2017  . HCAP (healthcare-associated pneumonia)  03/04/2017  . Acute on chronic respiratory failure ([place]) 03/04/2017  . Atrial fibrillation with rapid ventricular response ([place])   . Chronic diastolic heart failure ([place])   . Elevated troponin   . Sepsis ([place]) 02/25/2017  . Emesis, persistent 02/25/2017  . Constipation 02/25/2017  . Overflow diarrhea/Constipation 02/25/2017  . Rt Sided Aspiration pneumonia ([place]) 02/25/2017  . Diabetes mellitus type 2, uncontrolled, without complications ([place]) 28/00/3491  . Non compliance w medication regimen 12/02/2015  . Preseptal cellulitis 05/09/2015  . Other and unspecified hyperlipidemia 05/12/2014  . Elevated LFTs 05/12/2014  . Foot laceration 05/12/2014  . Dyspnea 12/19/2013  . Acute on chronic respiratory failure with hypoxia ([place]) 11/17/2013  . Diastolic dysfunction 79/15/0569  . COPD exacerbation ([place]) 11/15/2013  . Atrial fibrillation ([place]) 11/10/2013  . Elevated PSA 01/20/2013  . Diabetes mellitus type 2, uncontrolled ([place])   . COPD (chronic obstructive pulmonary disease) ([place])   . Hypertension   . Allergy   . Elevated lipids   . Pulmonary fibrosis ([place])   . Bronchitis   . Colon polyps   . Colon polyps     Past Surgical History:  Procedure Laterality Date  . CATARACT EXTRACTION W/PHACO  06/25/2012   Procedure: CATARACT EXTRACTION PHACO AND INTRAOCULAR LENS PLACEMENT (IOC);  Surgeon: Elta Guadeloupe T. Gershon Crane, MD;  Location: [facility];  Service: Ophthalmology;  Laterality: Left;  CDE=19.01  . CATARACT EXTRACTION W/PHACO  07/09/2012  Procedure: CATARACT EXTRACTION PHACO AND INTRAOCULAR LENS PLACEMENT (IOC);  Surgeon: Elta Guadeloupe T. Gershon Crane, MD;  Location: [facility];  Service: Ophthalmology;  Laterality: Right;  CDE: 20.09       Home Medications    Prior to Admission medications   Medication Sig Start Date End Date Taking? Authorizing Provider  albuterol (PROVENTIL) (2.5 MG/3ML) 0.083% nebulizer solution INHALE 1 VIAL VIA NEBULIZER EVERY 6 HOURS AS NEEDED FOR WHEEZING OR SHORTNESS OF BREATH 07/19/17    Orlena Sheldon, PA-C  apixaban (ELIQUIS) 2.5 MG TABS tablet Take 1 tablet (2.5 mg total) by mouth 2 (two) times daily. 08/30/17   Orlena Sheldon, PA-C  baclofen (LIORESAL) 10 MG tablet Take 10 mg by mouth 3 (three) times daily.    [provider]  budesonide (PULMICORT) 0.5 MG/2ML nebulizer solution Take 2 mLs (0.5 mg total) by nebulization 2 (two) times daily. 07/19/17   Orlena Sheldon, PA-C  diltiazem (CARDIZEM CD) 240 MG 24 hr capsule Take 1 capsule (240 mg total) by mouth daily. 07/19/17   Orlena Sheldon, PA-C  furosemide (LASIX) 40 MG tablet Take 2 tablets (80 mg total) by mouth 3 (three) times daily. 07/19/17 07/19/18  Dena Billet B, PA-C  ipratropium (ATROVENT) 0.02 % nebulizer solution Take 2.5 mLs (0.5 mg total) by nebulization 4 (four) times daily. 07/19/17   Orlena Sheldon, PA-C  losartan (COZAAR) 100 MG tablet Take 1 tablet (100 mg total) by mouth daily. 07/19/17   Dena Billet B, PA-C  metoprolol tartrate (LOPRESSOR) 25 MG tablet TAKE 1 TABLET BY MOUTH TWICE DAILY 09/03/17   Dena Billet B, PA-C  mirtazapine (REMERON) 30 MG tablet TAKE 1 TABLET BY MOUTH AT BEDTIME 06/28/17   Dixon, Mary B, PA-C  NOVOLIN N RELION 100 UNIT/ML injection INJECT 30 UNITS SUBCUTANEOUSLY WITH BREAKFAST AND 30 UNITS WITH SUPPER DAILY 09/19/17   Orlena Sheldon, PA-C  omeprazole (PRILOSEC) 20 MG capsule Take 1 capsule (20 mg total) by mouth daily. 03/23/16   Dena Billet B, PA-C  ONE TOUCH ULTRA TEST test strip USE TO CHECK BLOOD SUGAR EACH MORNING AND THEN 2 HOURS AFTER ANY MEAL 04/23/17   Dixon, Stanton Kidney B, PA-C  ONETOUCH DELICA LANCETS 33A MISC USE TO CHECK BLOOD SUGAR TWICE DAILY AS DIRECTED 03/20/17   Dena Billet B, PA-C  potassium chloride SA (K-DUR,KLOR-CON) 20 MEQ tablet Take 2 tablets 3 times a day 07/23/17   Dena Billet B, PA-C  pravastatin (PRAVACHOL) 80 MG tablet Take 1 tablet (80 mg total) by mouth at bedtime. 07/19/17   Orlena Sheldon, PA-C    Family History Family History  Problem Relation Age of Onset    . Heart disease Mother   . CAD Other   . Diabetes Other     Social History Social History   Tobacco Use  . Smoking status: Former Smoker    Packs/day: 1.50    Years: 60.00    Pack years: 90.00    Types: Cigarettes    Last attempt to quit: 12/31/2012    Years since quitting: 4.7  . Smokeless tobacco: Never Used  Substance Use Topics  . Alcohol use: No  . Drug use: No     Allergies   Ace inhibitors   Review of Systems Review of Systems  All other systems reviewed and are negative.    Physical Exam Updated Vital Signs BP (!) 157/77   Pulse (!) 106   Temp 98.5 F (36.9 C) (Oral)   Resp 20  Ht 5' (1.524 m)   Wt 88.9 kg (196 lb)   SpO2 100%   BMI 38.28 kg/m   Physical Exam  Constitutional: He is oriented to person, place, and time. He appears well-developed. He appears distressed (Uncomfortable).  Elderly, obese  HENT:  Head: Normocephalic and atraumatic.  Right Ear: External ear normal.  Left Ear: External ear normal.  Eyes: Conjunctivae and EOM are normal. Pupils are equal, round, and reactive to light.  Neck: Normal range of motion and phonation normal. Neck supple.  Cardiovascular: Normal heart sounds.  Irregular tachycardia.  Normal peripheral pulse.  Pulmonary/Chest: Effort normal and breath sounds normal. No stridor. No respiratory distress. He exhibits no bony tenderness.  Abdominal: Soft. There is no tenderness.  Musculoskeletal: Normal range of motion. He exhibits edema (3+ bilateral lower legs with vague indistinct erythema in nonspecific pattern of the lower legs bilaterally.).  Neurological: He is alert and oriented to person, place, and time. No cranial nerve deficit or sensory deficit. He exhibits normal muscle tone. Coordination normal.  Skin: Skin is warm, dry and intact.  Psychiatric: He has a normal mood and affect. His behavior is normal. Judgment and thought content normal.  Nursing note and vitals reviewed.    ED Treatments / Results   Labs (all labs ordered are listed, but only abnormal results are displayed) Labs Reviewed  CBC WITH DIFFERENTIAL/PLATELET - Abnormal; Notable for the following components:      Result Value   RBC 4.00 (*)    Hemoglobin 9.4 (*)    HCT 30.8 (*)    MCV 77.0 (*)    MCH 23.5 (*)    RDW 17.2 (*)    Platelets 456 (*)    Monocytes Absolute 1.3 (*)    All other components within normal limits  BRAIN NATRIURETIC PEPTIDE - Abnormal; Notable for the following components:   B Natriuretic Peptide 224.0 (*)    All other components within normal limits  I-STAT CHEM 8, ED - Abnormal; Notable for the following components:   Sodium 132 (*)    Chloride 97 (*)    BUN 25 (*)    Glucose, Bld 203 (*)    Calcium, Ion 1.11 (*)    Hemoglobin 9.9 (*)    HCT 29.0 (*)    All other components within normal limits  I-STAT TROPONIN, ED    EKG  EKG Interpretation  Date/Time:  Saturday September 22 2017 16:03:46 EST Ventricular Rate:  171 PR Interval:    QRS Duration: 74 QT Interval:  275 QTC Calculation: 464 R Axis:   64 Text Interpretation:  Atrial fibrillation with rapid V-rate Ventricular premature complex ST depression, probably rate related Since last tracing Atrial fibrillation with rapid ventricular response is new Confirmed by Daleen Bo [phone]) on 09/22/2017 5:07:49 PM       EKG Interpretation  Date/Time:  Saturday September 22 2017 18:28:41 EST Ventricular Rate:  102 PR Interval:    QRS Duration: 78 QT Interval:  337 QTC Calculation: 439 R Axis:   53 Text Interpretation:  Sinus tachycardia Since last tracing of earlier today now in sinus with improved rate Confirmed by Daleen Bo [phone]) on 09/22/2017 6:37:26 PM         CHA2DS2/VAS Stroke Risk Points      5 >= 2 Points: High Risk  1 - 1.99 Points: Medium Risk  0 Points: Low Risk    The patient's score has not changed in the past year.:  No Change  Details    This score determines the patient's risk of having a  stroke if the  patient has atrial fibrillation.       Points Metrics  1 Has Congestive Heart Failure:  Yes   0 Has Vascular Disease:  No   1 Has Hypertension:  Yes   2 Age:  36   1 Has Diabetes:  Yes   0 Had Stroke:  No  Had TIA:  No  Had thromboembolism:  No   0 Male:  No            Radiology Dg Chest Port 1 View  Result Date: 09/22/2017 CLINICAL DATA:  Bilateral lower extremity swelling and redness as well as abdominal swelling. EXAM: PORTABLE CHEST 1 VIEW COMPARISON:  03/04/2017 and 07/24/2016 FINDINGS: Lungs are adequately inflated with prominence of the perihilar markings with slightly more confluent opacification in the right infrahilar region. No evidence of effusion. Cardiomediastinal silhouette is within normal. There is minimal calcified plaque over the aortic arch. Remainder of the exam is unchanged. IMPRESSION: Findings suggesting mild vascular congestion. Slightly more confluent opacification in the right infrahilar region as early infection is possible. Electronically Signed   By: Marin Olp M.D.   On: 09/22/2017 16:45    Procedures .Critical Care Performed by: Daleen Bo, MD Authorized by: Daleen Bo, MD   Critical care provider statement:    Critical care time (minutes):  50   Critical care start time:  09/22/2017 4:40 PM   Critical care end time:  09/22/2017 6:24 PM   Critical care time was exclusive of:  Separately billable procedures and treating other patients   Critical care was necessary to treat or prevent imminent or life-threatening deterioration of the following conditions:  Cardiac failure   Critical care was time spent personally by me on the following activities:  Blood draw for specimens, development of treatment plan with patient or surrogate, evaluation of patient's response to treatment, examination of patient, obtaining history from patient or surrogate, ordering and performing treatments and interventions, ordering and review of  laboratory studies, ordering and review of radiographic studies, pulse oximetry, re-evaluation of patient's condition and review of old charts    (including critical care time)  Medications Ordered in ED Medications  diltiazem (CARDIZEM) 1 mg/mL load via infusion 10 mg (10 mg Intravenous Bolus from Bag 09/22/17 1703)    And  diltiazem (CARDIZEM) 100 mg in dextrose 5% 129mL (1 mg/mL) infusion (5 mg/hr Intravenous New Bag/Given 09/22/17 1703)  furosemide (LASIX) injection 20 mg (not administered)  0.9 %  sodium chloride infusion ( Intravenous New Bag/Given 09/22/17 1703)     Initial Impression / Assessment and Plan / ED Course  I have reviewed the triage vital signs and the nursing notes.  Pertinent labs & imaging results that were available during my care of the patient were reviewed by me and considered in my medical decision making (see chart for details).  Clinical Course as of Sep 23 1839  Sat Sep 22, 2017  1810 Low Sodium: (!) 132 [EW]  1810 Low Chloride: (!) 97 [EW]  1810 High BUN: (!) 25 [EW]  1810 High Glucose: (!) 203 [EW]  1810 Low Hemoglobin: (!) 9.9 [EW]  1810 Normal Troponin i, poc: 0.02 [EW]  1810 High B Natriuretic Peptide: (!) 224.0 [EW]  1810 Normal WBC: 10.4 [EW]  1810 Consistent with vascular congestion DG Chest Port 1 View [EW]    Clinical Course User Index [EW] Daleen Bo, MD  Patient Vitals for the past 24 hrs:  BP Temp Temp src Pulse Resp SpO2 Height Weight  09/22/17 1815 (!) 157/77 - - (!) 106 20 100 % - -  09/22/17 1800 (!) 151/81 - - (!) 103 20 99 % - -  09/22/17 1745 125/78 - - (!) 106 (!) 21 99 % - -  09/22/17 1730 (!) 123/92 - - (!) 134 19 100 % - -  09/22/17 1715 121/90 - - (!) 125 (!) 25 99 % - -  09/22/17 1700 (!) 120/93 - - (!) 46 (!) 22 100 % - -  09/22/17 1630 101/83 - - (!) 176 (!) 29 98 % - -  09/22/17 1555 - - - - - - 5' (1.524 m) 88.9 kg (196 lb)  09/22/17 1553 (!) 158/84 98.5 F (36.9 C) Oral (!) 162 19 92 % - -    6:42  PM Reevaluation with update and discussion. After initial assessment and treatment, an updated evaluation reveals he is comfortable at this time.  Findings discussed with the patient and all questions answered. Daleen Bo   6:42 PM-Consult complete with hospitalist. Patient case explained and discussed.  He agrees to admit patient for further evaluation and treatment. Call ended at 75: 67  Final Clinical Impressions(s) / ED Diagnoses   Final diagnoses:  Atrial fibrillation with RVR ([place])  Peripheral edema  History of medication noncompliance   Patient with chronic leg swelling, apparently worsening, with recurrent atrial fibrillation now with rapid ventricular response.  Patient has a documented history of noncompliance with treatment, and apparently has been not taking his Lasix by report of his PCP.  He was taken off metformin because of chronic renal insufficiency.  He is supposed to be on insulin.  Patient has a history of paroxysmal atrial fibrillation, and is currently anticoagulated.  Patient converted to sinus tachycardia with Cardizem treatment.  He is on chronic intermittent oxygen use at home for chronic lung disease.  Nursing Notes Reviewed/ Care Coordinated Applicable Imaging Reviewed Interpretation of Laboratory Data incorporated into ED treatment  Plan: Admit for observation   ED Discharge Orders    None       Daleen Bo, MD 09/22/17 2014

## 2017-09-23 ENCOUNTER — Observation Stay (HOSPITAL_COMMUNITY): Payer: Medicare Other

## 2017-09-23 ENCOUNTER — Inpatient Hospital Stay (HOSPITAL_COMMUNITY): Payer: Medicare Other

## 2017-09-23 DIAGNOSIS — J449 Chronic obstructive pulmonary disease, unspecified: Secondary | ICD-10-CM | POA: Diagnosis present

## 2017-09-23 DIAGNOSIS — Z87891 Personal history of nicotine dependence: Secondary | ICD-10-CM | POA: Diagnosis not present

## 2017-09-23 DIAGNOSIS — M7989 Other specified soft tissue disorders: Secondary | ICD-10-CM | POA: Diagnosis present

## 2017-09-23 DIAGNOSIS — I4891 Unspecified atrial fibrillation: Secondary | ICD-10-CM

## 2017-09-23 DIAGNOSIS — I519 Heart disease, unspecified: Secondary | ICD-10-CM | POA: Diagnosis not present

## 2017-09-23 DIAGNOSIS — E1122 Type 2 diabetes mellitus with diabetic chronic kidney disease: Secondary | ICD-10-CM | POA: Diagnosis present

## 2017-09-23 DIAGNOSIS — Z9842 Cataract extraction status, left eye: Secondary | ICD-10-CM | POA: Diagnosis not present

## 2017-09-23 DIAGNOSIS — R1900 Intra-abdominal and pelvic swelling, mass and lump, unspecified site: Secondary | ICD-10-CM | POA: Diagnosis not present

## 2017-09-23 DIAGNOSIS — Z79899 Other long term (current) drug therapy: Secondary | ICD-10-CM | POA: Diagnosis not present

## 2017-09-23 DIAGNOSIS — I10 Essential (primary) hypertension: Secondary | ICD-10-CM | POA: Diagnosis not present

## 2017-09-23 DIAGNOSIS — Z9981 Dependence on supplemental oxygen: Secondary | ICD-10-CM | POA: Diagnosis not present

## 2017-09-23 DIAGNOSIS — I480 Paroxysmal atrial fibrillation: Secondary | ICD-10-CM | POA: Diagnosis present

## 2017-09-23 DIAGNOSIS — Z833 Family history of diabetes mellitus: Secondary | ICD-10-CM | POA: Diagnosis not present

## 2017-09-23 DIAGNOSIS — E1165 Type 2 diabetes mellitus with hyperglycemia: Secondary | ICD-10-CM | POA: Diagnosis present

## 2017-09-23 DIAGNOSIS — Z9841 Cataract extraction status, right eye: Secondary | ICD-10-CM | POA: Diagnosis not present

## 2017-09-23 DIAGNOSIS — Z66 Do not resuscitate: Secondary | ICD-10-CM | POA: Diagnosis present

## 2017-09-23 DIAGNOSIS — N183 Chronic kidney disease, stage 3 (moderate): Secondary | ICD-10-CM | POA: Diagnosis not present

## 2017-09-23 DIAGNOSIS — J9610 Chronic respiratory failure, unspecified whether with hypoxia or hypercapnia: Secondary | ICD-10-CM | POA: Diagnosis present

## 2017-09-23 DIAGNOSIS — Z888 Allergy status to other drugs, medicaments and biological substances status: Secondary | ICD-10-CM | POA: Diagnosis not present

## 2017-09-23 DIAGNOSIS — I5032 Chronic diastolic (congestive) heart failure: Secondary | ICD-10-CM | POA: Diagnosis not present

## 2017-09-23 DIAGNOSIS — J439 Emphysema, unspecified: Secondary | ICD-10-CM | POA: Diagnosis not present

## 2017-09-23 DIAGNOSIS — Z794 Long term (current) use of insulin: Secondary | ICD-10-CM | POA: Diagnosis not present

## 2017-09-23 DIAGNOSIS — I130 Hypertensive heart and chronic kidney disease with heart failure and stage 1 through stage 4 chronic kidney disease, or unspecified chronic kidney disease: Secondary | ICD-10-CM | POA: Diagnosis present

## 2017-09-23 DIAGNOSIS — Z7901 Long term (current) use of anticoagulants: Secondary | ICD-10-CM | POA: Diagnosis not present

## 2017-09-23 DIAGNOSIS — Z8249 Family history of ischemic heart disease and other diseases of the circulatory system: Secondary | ICD-10-CM | POA: Diagnosis not present

## 2017-09-23 DIAGNOSIS — Z9114 Patient's other noncompliance with medication regimen: Secondary | ICD-10-CM | POA: Diagnosis not present

## 2017-09-23 DIAGNOSIS — I5033 Acute on chronic diastolic (congestive) heart failure: Secondary | ICD-10-CM | POA: Diagnosis present

## 2017-09-23 DIAGNOSIS — Z961 Presence of intraocular lens: Secondary | ICD-10-CM | POA: Diagnosis present

## 2017-09-23 DIAGNOSIS — E785 Hyperlipidemia, unspecified: Secondary | ICD-10-CM | POA: Diagnosis present

## 2017-09-23 LAB — ECHOCARDIOGRAM COMPLETE
AO mean calculated velocity dopler: 160 cm/s
AOVTI: 50.5 cm
AV Area VTI index: 0.73 cm2/m2
AV Area mean vel: 1.28 cm2
AV Mean grad: 12 mmHg
AV Peak grad: 25 mmHg
AV peak Index: 0.59
AV pk vel: 251 cm/s
AVA: 1.52 cm2
AVAREAMEANVIN: 0.61 cm2/m2
AVAREAVTI: 1.24 cm2
AVCELMEANRAT: 0.5
Ao pk vel: 0.49 m/s
CHL CUP AV VEL: 1.52
CHL CUP DOP CALC LVOT VTI: 30.2 cm
CHL CUP MV DEC (S): 232
CHL CUP STROKE VOLUME: 44 mL
E decel time: 232 msec
E/e' ratio: 15.37
FS: 38 % (ref 28–44)
Height: 66 in
IV/PV OW: 0.99
LA vol A4C: 54 ml
LA vol index: 20.2 mL/m2
LADIAMINDEX: 1.53 cm/m2
LASIZE: 32 mm
LAVOL: 42.2 mL
LDCA: 2.54 cm2
LEFT ATRIUM END SYS DIAM: 32 mm
LV E/e' medial: 15.37
LV PW d: 12 mm — AB (ref 0.6–1.1)
LV dias vol index: 31 mL/m2
LV dias vol: 65 mL (ref 62–150)
LV e' LATERAL: 7.94 cm/s
LV sys vol index: 10 mL/m2
LV sys vol: 21 mL
LVEEAVG: 15.37
LVOT SV: 77 mL
LVOT peak VTI: 0.6 cm
LVOT peak grad rest: 6 mmHg
LVOTD: 18 mm
LVOTPV: 123 cm/s
MVPG: 6 mmHg
MVPKAVEL: 110 m/s
MVPKEVEL: 122 m/s
RV LATERAL S' VELOCITY: 20.8 cm/s
RV TAPSE: 22.4 mm
Simpson's disk: 68
TDI e' lateral: 7.94
TDI e' medial: 8.27
Valve area index: 0.73
Weight: 3209.9 oz

## 2017-09-23 LAB — BASIC METABOLIC PANEL
Anion gap: 8 (ref 5–15)
BUN: 20 mg/dL (ref 6–20)
CHLORIDE: 102 mmol/L (ref 101–111)
CO2: 29 mmol/L (ref 22–32)
CREATININE: 1.09 mg/dL (ref 0.61–1.24)
Calcium: 8.7 mg/dL — ABNORMAL LOW (ref 8.9–10.3)
GFR calc Af Amer: >60 mL/min (ref >60)
GFR calc non Af Amer: >60 mL/min (ref >60)
Glucose, Bld: 52 mg/dL — ABNORMAL LOW (ref 65–99)
Potassium: 3.7 mmol/L (ref 3.5–5.1)
Sodium: 139 mmol/L (ref 135–145)

## 2017-09-23 LAB — GLUCOSE, CAPILLARY
GLUCOSE-CAPILLARY: 124 mg/dL — AB (ref 65–99)
GLUCOSE-CAPILLARY: 148 mg/dL — AB (ref 65–99)
Glucose-Capillary: 96 mg/dL (ref 65–99)

## 2017-09-23 LAB — MRSA PCR SCREENING: MRSA by PCR: NEGATIVE

## 2017-09-23 MED ORDER — IPRATROPIUM-ALBUTEROL 0.5-2.5 (3) MG/3ML IN SOLN
3.0000 mL | Freq: Four times a day (QID) | RESPIRATORY_TRACT | Status: DC
  Filled 2017-09-23: qty 3

## 2017-09-23 MED ORDER — LEVALBUTEROL HCL 1.25 MG/0.5ML IN NEBU
1.2500 mg | INHALATION_SOLUTION | Freq: Four times a day (QID) | RESPIRATORY_TRACT | Status: DC
  Administered 2017-09-23 – 2017-09-24 (×5): 1.25 mg via RESPIRATORY_TRACT
  Filled 2017-09-23 (×5): qty 0.5

## 2017-09-23 MED ORDER — INSULIN NPH (HUMAN) (ISOPHANE) 100 UNIT/ML ~~LOC~~ SUSP
25.0000 [IU] | Freq: Two times a day (BID) | SUBCUTANEOUS | Status: DC
  Filled 2017-09-23: qty 10

## 2017-09-23 MED ORDER — DILTIAZEM HCL ER COATED BEADS 240 MG PO CP24
240.0000 mg | ORAL_CAPSULE | Freq: Every day | ORAL | Status: DC
  Administered 2017-09-23 – 2017-09-24 (×2): 240 mg via ORAL
  Filled 2017-09-23 (×2): qty 1

## 2017-09-23 MED ORDER — IPRATROPIUM BROMIDE 0.02 % IN SOLN
0.5000 mg | Freq: Four times a day (QID) | RESPIRATORY_TRACT | Status: DC
  Administered 2017-09-23 – 2017-09-24 (×4): 0.5 mg via RESPIRATORY_TRACT
  Filled 2017-09-23 (×4): qty 2.5

## 2017-09-23 MED ORDER — IPRATROPIUM-ALBUTEROL 0.5-2.5 (3) MG/3ML IN SOLN: 3.0000 mL | Freq: Four times a day (QID) | RESPIRATORY_TRACT | Status: DC

## 2017-09-23 MED ORDER — SENNOSIDES-DOCUSATE SODIUM 8.6-50 MG PO TABS
1.0000 | ORAL_TABLET | Freq: Two times a day (BID) | ORAL | Status: DC
  Administered 2017-09-23 – 2017-09-24 (×3): 1 via ORAL
  Filled 2017-09-23 (×3): qty 1

## 2017-09-23 MED ORDER — INSULIN NPH (HUMAN) (ISOPHANE) 100 UNIT/ML ~~LOC~~ SUSP
15.0000 [IU] | Freq: Two times a day (BID) | SUBCUTANEOUS | Status: DC
  Administered 2017-09-23: 15 [IU] via SUBCUTANEOUS
  Filled 2017-09-23 (×2): qty 10

## 2017-09-23 MED ORDER — FUROSEMIDE 80 MG PO TABS
80.0000 mg | ORAL_TABLET | Freq: Three times a day (TID) | ORAL | Status: DC
  Administered 2017-09-23 – 2017-09-24 (×4): 80 mg via ORAL
  Filled 2017-09-23 (×4): qty 1

## 2017-09-23 NOTE — Progress Notes (Signed)
PROGRESS NOTE   Edward [name]  LKT:[id number]  DOB: [birth year]  DOA: 09/22/2017 PCP: Orlena Sheldon, PA-C  Brief Admission Hx: Edward [name] is a 82 y.o. male with a history of diabetes type 2 on insulin, grade 2 diastolic heart failure on echocardiogram 02/26/2017 with preserved EF, chronic respiratory failure on oxygen at home, COPD, hyperlipidemia, hypertension, atrial fibrillation on anticoagulation with a chads 2 vascular score of 5.  Chest x-ray shows pulmonary congestion.  Patient given Lasix in the ED (20 mg IV).  He was noted to be in A. fib with RVR and was started on Cardizem drip.   MDM/Assessment & Plan:   1. Atrial Fibrillation with RVR - the patient was initially placed on IV diltiazem and now has been weaned off, HR is controlled now, He will be restarted on his home oral diltiazem dose.  Will transfer to telemetry.    2.  Diastolic Heart failure with acute exacerbation - The patient was diuresed with IV lasix.  Will resume his home oral lasix dose today.  His renal function remains stable.  Monitor weights/ intake and output and follow.   3. Uncontrolled diabetes Mellitus - He has had some low trending BS, will reduce dose of NPH insulin.  Follow BS more frequently.  A1c is >12% which means he likely is not taking the insulin at home.   4. Stage 3 CKD - creatinine is holding stable.   5. Hypertension - stable, following.  6. COPD - resume home inhalers and add xopenex nebs.   DVT prophylaxis: apixaban Code Status: Full  Family Communication: none present Disposition Plan: TBD  Subjective: Pt sitting up in chair, eating breakfast, no complaints.    Objective: Vitals:   09/23/17 0515 09/23/17 0530 09/23/17 0545 09/23/17 0600  BP: (!) 94/48 (!) 96/52 (!) 99/49 116/66  Pulse: (!) 51 (!) 55 (!) 52 67  Resp: 17 15 17 15   Temp:      TempSrc:      SpO2: 91% 92% 93% 92%  Weight:      Height:        Intake/Output Summary (Last 24 hours) at 09/23/2017 0737 Last data  filed at 09/23/2017 0630 Gross per 24 hour  Intake 302.5 ml  Output 900 ml  Net -597.5 ml   Filed Weights   09/22/17 1555 09/22/17 2056 09/23/17 0500  Weight: 88.9 kg (196 lb) 91.3 kg (201 lb 4.5 oz) 91 kg (200 lb 9.9 oz)   REVIEW OF SYSTEMS  As per history otherwise all reviewed and reported negative  Exam:  General exam: chronically ill appearing male, NAD. Cooperative.  Respiratory system: diffuse exp wheezing bilateral. No increased work of breathing. Cardiovascular system: S1 & S2 heard.   Gastrointestinal system: Abdomen is nondistended, soft and nontender. Normal bowel sounds heard. Central nervous system: Alert and oriented. No focal neurological deficits. Extremities: trace pretibial edema bilateral LEs.  Data Reviewed: Basic Metabolic Panel: Recent Labs  Lab 09/22/17 1628 09/23/17 0420  NA 132* 139  K 5.0 3.7  CL 97* 102  CO2  --  29  GLUCOSE 203* 52*  BUN 25* 20  CREATININE 1.00 1.09  CALCIUM  --  8.7*   Liver Function Tests: No results for input(s): AST, ALT, ALKPHOS, BILITOT, PROT, ALBUMIN in the last 168 hours. No results for input(s): LIPASE, AMYLASE in the last 168 hours. No results for input(s): AMMONIA in the last 168 hours. CBC: Recent Labs  Lab 09/22/17 1608 09/22/17 1628  WBC  10.4  --   NEUTROABS 6.4  --   HGB 9.4* 9.9*  HCT 30.8* 29.0*  MCV 77.0*  --   PLT 456*  --    Cardiac Enzymes: No results for input(s): CKTOTAL, CKMB, CKMBINDEX, TROPONINI in the last 168 hours. CBG (last 3)  Recent Labs    09/22/17 2119  GLUCAP 153*   Recent Results (from the past 240 hour(s))  MRSA PCR Screening     Status: None   Collection Time: 09/22/17  8:29 PM  Result Value Ref Range Status   MRSA by PCR NEGATIVE NEGATIVE Final    Comment:        The GeneXpert MRSA Assay (FDA approved for NASAL specimens only), is one component of a comprehensive MRSA colonization surveillance program. It is not intended to diagnose MRSA infection nor to guide  or monitor treatment for MRSA infections.      Studies: Dg Chest Port 1 View  Result Date: 09/22/2017 CLINICAL DATA:  Bilateral lower extremity swelling and redness as well as abdominal swelling. EXAM: PORTABLE CHEST 1 VIEW COMPARISON:  03/04/2017 and 07/24/2016 FINDINGS: Lungs are adequately inflated with prominence of the perihilar markings with slightly more confluent opacification in the right infrahilar region. No evidence of effusion. Cardiomediastinal silhouette is within normal. There is minimal calcified plaque over the aortic arch. Remainder of the exam is unchanged. IMPRESSION: Findings suggesting mild vascular congestion. Slightly more confluent opacification in the right infrahilar region as early infection is possible. Electronically Signed   By: Marin Olp M.D.   On: 09/22/2017 16:45   Scheduled Meds: . apixaban  2.5 mg Oral BID  . baclofen  10 mg Oral TID  . budesonide  0.5 mg Nebulization BID  . dextromethorphan-guaiFENesin  1 tablet Oral BID  . diltiazem  240 mg Oral Daily  . furosemide  80 mg Oral TID  . insulin aspart  0-15 Units Subcutaneous TID WC  . insulin aspart  0-5 Units Subcutaneous QHS  . insulin NPH Human  25 Units Subcutaneous BID AC & HS  . ipratropium-albuterol  3 mL Nebulization Q6H WA  . losartan  100 mg Oral Daily  . metoprolol tartrate  25 mg Oral BID  . mirtazapine  30 mg Oral QHS  . pantoprazole  40 mg Oral Daily  . potassium chloride SA  20 mEq Oral TID  . pravastatin  80 mg Oral QHS  . sodium chloride flush  3 mL Intravenous Q12H   Continuous Infusions: . sodium chloride    . diltiazem (CARDIZEM) infusion Stopped (09/23/17 0630)    Principal Problem:   Atrial fibrillation with RVR ([name]) Active Problems:   COPD (chronic obstructive pulmonary disease) ([name])   Hypertension   Chronic diastolic heart failure ([place])   Uncontrolled type 2 diabetes mellitus with hyperglycemia, with long-term current use of insulin ([name])   CKD (chronic  kidney disease), stage III [facility])  Critical Care Time spent: 83 mins  Irwin Brakeman, MD, FAAFP [facility] Pager [phone]  If 7PM-7AM, please contact night-coverage [email or web address] Password [facility] 09/23/2017, 7:37 AM    LOS: 0 days

## 2017-09-23 NOTE — Progress Notes (Signed)
*  PRELIMINARY RESULTS* Echocardiogram 2D Echocardiogram has been performed.  Edward [name] 09/23/2017, 12:47 PM

## 2017-09-24 ENCOUNTER — Telehealth: Payer: Self-pay | Admitting: *Deleted

## 2017-09-24 LAB — BASIC METABOLIC PANEL
Anion gap: 12 (ref 5–15)
BUN: 22 mg/dL — AB (ref 6–20)
CHLORIDE: 101 mmol/L (ref 101–111)
CO2: 29 mmol/L (ref 22–32)
CREATININE: 1.29 mg/dL — AB (ref 0.61–1.24)
Calcium: 9 mg/dL (ref 8.9–10.3)
GFR, EST AFRICAN AMERICAN: 59 mL/min — AB (ref >60)
GFR, EST NON AFRICAN AMERICAN: 51 mL/min — AB (ref >60)
Glucose, Bld: 70 mg/dL (ref 65–99)
POTASSIUM: 4.2 mmol/L (ref 3.5–5.1)
SODIUM: 142 mmol/L (ref 135–145)

## 2017-09-24 LAB — GLUCOSE, CAPILLARY
GLUCOSE-CAPILLARY: 128 mg/dL — AB (ref 65–99)
GLUCOSE-CAPILLARY: 46 mg/dL — AB (ref 65–99)
Glucose-Capillary: 102 mg/dL — ABNORMAL HIGH (ref 65–99)

## 2017-09-24 MED ORDER — INSULIN NPH (HUMAN) (ISOPHANE) 100 UNIT/ML ~~LOC~~ SUSP: 8.0000 [IU] | Freq: Two times a day (BID) | SUBCUTANEOUS | Status: DC

## 2017-09-24 MED ORDER — INSULIN NPH (HUMAN) (ISOPHANE) 100 UNIT/ML ~~LOC~~ SUSP: 5.0000 [IU] | Freq: Two times a day (BID) | SUBCUTANEOUS | Status: DC

## 2017-09-24 MED ORDER — FUROSEMIDE 40 MG PO TABS: 40.0000 mg | ORAL_TABLET | ORAL | Status: DC

## 2017-09-24 MED ORDER — POTASSIUM CHLORIDE CRYS ER 20 MEQ PO TBCR: 20.0000 meq | EXTENDED_RELEASE_TABLET | ORAL | Status: DC

## 2017-09-24 MED ORDER — INSULIN NPH (HUMAN) (ISOPHANE) 100 UNIT/ML ~~LOC~~ SUSP: 5.0000 [IU] | Freq: Two times a day (BID) | SUBCUTANEOUS | 2 refills | Status: DC

## 2017-09-24 NOTE — Discharge Instructions (Signed)
Follow with Primary MD  Orlena Sheldon, PA-C  and other consultant's as instructed your Hospitalist MD  Please get a complete blood count and chemistry panel checked by your Primary MD at your next visit, and again as instructed by your Primary MD.  Get Medicines reviewed and adjusted: Please take all your medications with you for your next visit with your Primary MD  Laboratory/radiological data: Please request your Primary MD to go over all hospital tests and procedure/radiological results at the follow up, please ask your Primary MD to get all Hospital records sent to his/her office.  In some cases, they will be blood work, cultures and biopsy results pending at the time of your discharge. Please request that your primary care M.D. follows up on these results.  Also Note the following: If you experience worsening of your admission symptoms, develop shortness of breath, life threatening emergency, suicidal or homicidal thoughts you must seek medical attention immediately by calling 911 or calling your MD immediately  if symptoms less severe.  You must read complete instructions/literature along with all the possible adverse reactions/side effects for all the Medicines you take and that have been prescribed to you. Take any new Medicines after you have completely understood and accpet all the possible adverse reactions/side effects.   Do not drive when taking Pain medications or sleeping medications (Benzodaizepines)  Do not take more than prescribed Pain, Sleep and Anxiety Medications. It is not advisable to combine anxiety,sleep and pain medications without talking with your primary care practitioner  Special Instructions: If you have smoked or chewed Tobacco  in the last 2 yrs please stop smoking, stop any regular Alcohol  and or any Recreational drug use.  Wear Seat belts while driving.  Please note: You were cared for by a hospitalist during your hospital stay. Once you are discharged,  your primary care physician will handle any further medical issues. Please note that NO REFILLS for any discharge medications will be authorized once you are discharged, as it is imperative that you return to your primary care physician (or establish a relationship with a primary care physician if you do not have one) for your post hospital discharge needs so that they can reassess your need for medications and monitor your lab values.

## 2017-09-24 NOTE — Care Management Note (Addendum)
Case Management Note  Patient Details  Name: AYHAM WORD MRN: [id number] Date of Birth: Feb 20, 1935  Subjective/Objective:      admited with a-fib with RVR. Pt is from home, lives with family. He is ind with ADL's. Uses a RW and cane as needed. He has an aid that comes 1 hr per day 5 days a week. He has home oxygen and neb machine. He has no needs or concerns about [place] home today.               Action/Plan: DC home today with self care. No CM needs noted at this time. Pt on [facility] registry, will be referred for [facility] Calls.   Expected Discharge Date:  09/24/17               Expected Discharge Plan:  Home/Self Care  In-House Referral:  NA  Discharge planning Services  NA  Post Acute Care Choice:  NA Choice offered to:  NA  Status of Service:  Completed, signed off  If discussed at Long Length of Stay Meetings, dates discussed:    Additional Comments:  Sherald Barge, RN 09/24/2017, 10:20 AM

## 2017-09-24 NOTE — Patient Outreach (Signed)
[facility]) Care Management  09/24/2017  Edward [name] 05/07/1935 [id number]   Transition of care  [place] CM noted [name] was hospitalized for Atrial fibrillation on 09/22/17 and discharged on 09/24/17 home   CM called his home and his son answered and confirmed [name] was discharged on 09/24/17. Edward [name] reports prior to leaving the hospital [name] am cbg was "73 something"  His wife offered a "tomato sandwich" but Edward [name] reports he refused to take it after it was fixed. Edward [name] confirms [name] has a   Thursday pcp appointment at 1430. Edward [name] said [name] informed him and Mrs Fassnacht he was not going to the appointment.  It is also reported that since [name] has been home he has been sleeping and not eating  They confirmed he had not been checking weights prior to admission Spoke with son and Mrs Levingston in detail about symptoms of low and high blood sugars.  CM inquired about the cbg value since he left the hospital and was told there had not been any blood sugar checks since he left the hospital.   Cm had to strongly encourage [name] to check his cbg prior to concluding the call "I don;t have someone to check it for me." CM inquired about this response.   CM unable to get a clear answer from [name] nor his wife why [name] could not check his own cbg. His son, Edward [name] attempted to check his cbg but was unsuccessful.  The cbg was finally checked and the value was 246 This cbg was obtained by one of the son's wives, Edward [name] (who is visiting and has DM)  Cm talked with Mrs Deakin about the need to be knowledgeable about [name]'s home care.  A lot of her answers to CM questions today was "I don't know" and then she would ask the patient.  Mrs Beveridge was unable to tell CM what his hospital discharge sheets were, what they said, where his glucometer was, if he got his lasix from the pharmacy nor how much insulin Mr Sanderson is scheduled to  take Cm reviewed Epic after visit summary for today and reviewed the sheets after they were able to find the sheets in the home.    CM encouraged [name] and Edward [name] to go to pick up [name]'s lasix as soon as possible     Cm asked [name] if he was depressed and he stated "yes"  When asked if CM could discuss this call and his depression with Edward [name] his response was "yes"    Plan:   [facility] CM to consult with pcp about plan of care and follow up with [name] an in basket message to Edward [name] about this call to [name]  Routed this note to care team members   Joelene Millin L. [name], RN, BSN, [place] Care Management [phone]

## 2017-09-24 NOTE — Discharge Summary (Signed)
Physician Discharge Summary  Edward [name] CBJ:[id number] DOB: 02/11/1935 DOA: 09/22/2017  PCP: Orlena Sheldon, PA-C  Admit date: 09/22/2017 Discharge date: 09/24/2017  Admitted From: Home  Disposition:  Home with Home Health  Recommendations for Outpatient Follow-up:  1. Follow up with PCP in 1 weeks 2. Follow up with cardiology in 1-2 weeks 3. Please obtain BMP/CBC in one weeks 4. Please monitor blood sugar closely and adjust therapy as needed  Home Health: RN, PT, Aide, SW  Discharge Condition: STABLE   CODE STATUS: DNR    Brief Hospitalization Summary: Please see all hospital notes, images, labs for full details of the hospitalization.  HPI: Edward [name] is a 82 y.o. male with a history of diabetes type 2 on insulin, grade 2 diastolic heart failure on echocardiogram 02/26/2017 with preserved EF, chronic respiratory failure on oxygen at home, COPD, hyperlipidemia, hypertension, atrial fibrillation on anticoagulation with a chads 2 vascular score of 5.  Patient saw his primary care provider in the middle of December (09/12/17).  The patient states that his PCP took him off his Lasix and his diabetes medication, however per her notes it appears that she only stopped his metformin.  She was uncertain of whether he was taking the Lasix.  Since that appointment, the patient has not taken his Lasix and began to have worsening breathing and worsening leg swelling.  He presented to the hospital for evaluation today.  Respiratory status worse with exertion and improved with rest.  Emergency Department Course: Chest x-ray shows pulmonary congestion.  Patient given Lasix in the ED (20 mg IV).  He was noted to be in A. fib with RVR and was started on Cardizem drip.  Per the patient he has been taking his blood pressure medications including his Cardizem  Brief Admission Hx: Edward [name] a 82 y.o.malewith a history of diabetes type 2 on insulin, grade 2 diastolic heart failure on  echocardiogram 02/26/2017 with preserved EF, chronic respiratory failure on oxygen at home, COPD, hyperlipidemia, hypertension, atrial fibrillation on anticoagulation with a chads 2 vascular score of 5.  Chest x-ray shows pulmonary congestion. Patient given Lasix in the ED (20 mg IV). He was noted to be in A. fib with RVR and was started on Cardizem drip.   MDM/Assessment & Plan:   1. Atrial Fibrillation with RVR - the patient was initially placed on IV diltiazem and now has been weaned off, HR is controlled now, He was restarted on his home oral diltiazem dose and rate has been controlled and stable. Outpatient cardiology follow up recommended.     2.  Diastolic Heart failure with acute exacerbation - The patient was diuresed with IV lasix.  He had been resumed on home oral lasix and continued to diurese.  His oral lasix dose is being reduced as he likely is not taking it at home and the dose is too high.   His renal function remains stable.  He has diuresed more than 3 liters in last 2 days.  Weight is down as well.     3. Uncontrolled diabetes Mellitus - This is because he is not taking his insulin at home.  He has been restarted on reported home doses of insulin but that was way too much.  I have reduced his NPH to 5 units BID with meals only.  Follow BS more frequently.  A1c is >12% which means he likely is not taking the insulin at home.   4. Stage 3 CKD - creatinine  is holding stable.   5. Hypertension - stable, following.  6. COPD - resume home inhalers.   DVT prophylaxis: apixaban Code Status: Full  Family Communication: none present Disposition Plan: Home   Discharge Diagnoses:  Principal Problem:   Atrial fibrillation with RVR ([place]) Active Problems:   COPD (chronic obstructive pulmonary disease) ([name])   Hypertension   Chronic diastolic heart failure ([place])   Uncontrolled type 2 diabetes mellitus with hyperglycemia, with long-term current use of insulin ([name])   CKD (chronic kidney  disease), stage III [facility])  Discharge Instructions: Discharge Instructions    (HEART FAILURE PATIENTS) Call MD:  Anytime you have any of the following symptoms: 1) 3 pound weight gain in 24 hours or 5 pounds in 1 week 2) shortness of breath, with or without a dry hacking cough 3) swelling in the hands, feet or stomach 4) if you have to sleep on extra pillows at night in order to breathe.   Complete by:  As directed    Call MD for:  difficulty breathing, headache or visual disturbances   Complete by:  As directed    Call MD for:  extreme fatigue   Complete by:  As directed    Call MD for:  persistant dizziness or light-headedness   Complete by:  As directed    Call MD for:  severe uncontrolled pain   Complete by:  As directed    Diet - low sodium heart healthy   Complete by:  As directed    Increase activity slowly   Complete by:  As directed      Allergies as of 09/24/2017      Reactions   Ace Inhibitors Other (See Comments)   Hyperkalemia--07/23/2013:patient states not familiar with the following allergy      Medication List    TAKE these medications   albuterol (2.5 MG/3ML) 0.083% nebulizer solution Commonly known as:  PROVENTIL INHALE 1 VIAL VIA NEBULIZER EVERY 6 HOURS AS NEEDED FOR WHEEZING OR SHORTNESS OF BREATH   apixaban 2.5 MG Tabs tablet Commonly known as:  ELIQUIS Take 1 tablet (2.5 mg total) by mouth 2 (two) times daily.   baclofen 10 MG tablet Commonly known as:  LIORESAL Take 10 mg by mouth 3 (three) times daily.   budesonide 0.5 MG/2ML nebulizer solution Commonly known as:  PULMICORT Take 2 mLs (0.5 mg total) by nebulization 2 (two) times daily.   diltiazem 240 MG 24 hr capsule Commonly known as:  CARDIZEM CD Take 1 capsule (240 mg total) by mouth daily.   furosemide 40 MG tablet Commonly known as:  LASIX Take 1 tablet (40 mg total) by mouth every other day. Start taking on:  09/26/2017 What changed:    how much to take  when to take this    insulin NPH Human 100 UNIT/ML injection Commonly known as:  NOVOLIN N RELION Inject 0.05 mLs (5 Units total) into the skin 2 (two) times daily before a meal. What changed:  See the new instructions.   ipratropium 0.02 % nebulizer solution Commonly known as:  ATROVENT Take 2.5 mLs (0.5 mg total) by nebulization 4 (four) times daily.   losartan 100 MG tablet Commonly known as:  COZAAR Take 1 tablet (100 mg total) by mouth daily.   metoprolol tartrate 25 MG tablet Commonly known as:  LOPRESSOR TAKE 1 TABLET BY MOUTH TWICE DAILY   mirtazapine 30 MG tablet Commonly known as:  REMERON TAKE 1 TABLET BY MOUTH AT BEDTIME   omeprazole 20  MG capsule Commonly known as:  PRILOSEC Take 1 capsule (20 mg total) by mouth daily.   ONE TOUCH ULTRA TEST test strip Generic drug:  glucose blood USE TO CHECK BLOOD SUGAR EACH MORNING AND THEN 2 HOURS AFTER ANY MEAL   ONETOUCH DELICA LANCETS 41L Misc USE TO CHECK BLOOD SUGAR TWICE DAILY AS DIRECTED   OXYGEN Inhale 2 L into the lungs daily.   potassium chloride SA 20 MEQ tablet Commonly known as:  K-DUR,KLOR-CON Take 1 tablet (20 mEq total) by mouth every other day. Take with lasix. Start taking on:  09/26/2017 What changed:    how much to take  how to take this  when to take this  additional instructions   pravastatin 80 MG tablet Commonly known as:  PRAVACHOL Take 1 tablet (80 mg total) by mouth at bedtime.      Follow-up Information    Orlena Sheldon, PA-C. Schedule an appointment as soon as possible for a visit in 1 week(s).   Specialty:  Physician Assistant Why:  Hospital Follow Up  Contact information: [place] [address] [phone]        Herminio Commons, MD. Schedule an appointment as soon as possible for a visit in 2 week(s).   Specialty:  Cardiology Why:  Establish care Atrial Fibrillation Hospital Follow Up Contact information: [place]  [address] [phone]          Allergies  Allergen Reactions  . Ace Inhibitors Other (See Comments)    Hyperkalemia--07/23/2013:patient states not familiar with the following allergy   Allergies as of 09/24/2017      Reactions   Ace Inhibitors Other (See Comments)   Hyperkalemia--07/23/2013:patient states not familiar with the following allergy      Medication List    TAKE these medications   albuterol (2.5 MG/3ML) 0.083% nebulizer solution Commonly known as:  PROVENTIL INHALE 1 VIAL VIA NEBULIZER EVERY 6 HOURS AS NEEDED FOR WHEEZING OR SHORTNESS OF BREATH   apixaban 2.5 MG Tabs tablet Commonly known as:  ELIQUIS Take 1 tablet (2.5 mg total) by mouth 2 (two) times daily.   baclofen 10 MG tablet Commonly known as:  LIORESAL Take 10 mg by mouth 3 (three) times daily.   budesonide 0.5 MG/2ML nebulizer solution Commonly known as:  PULMICORT Take 2 mLs (0.5 mg total) by nebulization 2 (two) times daily.   diltiazem 240 MG 24 hr capsule Commonly known as:  CARDIZEM CD Take 1 capsule (240 mg total) by mouth daily.   furosemide 40 MG tablet Commonly known as:  LASIX Take 1 tablet (40 mg total) by mouth every other day. Start taking on:  09/26/2017 What changed:    how much to take  when to take this   insulin NPH Human 100 UNIT/ML injection Commonly known as:  NOVOLIN N RELION Inject 0.05 mLs (5 Units total) into the skin 2 (two) times daily before a meal. What changed:  See the new instructions.   ipratropium 0.02 % nebulizer solution Commonly known as:  ATROVENT Take 2.5 mLs (0.5 mg total) by nebulization 4 (four) times daily.   losartan 100 MG tablet Commonly known as:  COZAAR Take 1 tablet (100 mg total) by mouth daily.   metoprolol tartrate 25 MG tablet Commonly known as:  LOPRESSOR TAKE 1 TABLET BY MOUTH TWICE DAILY   mirtazapine 30 MG tablet Commonly known as:  REMERON TAKE 1 TABLET BY MOUTH AT BEDTIME   omeprazole  20 MG capsule Commonly known as:   PRILOSEC Take 1 capsule (20 mg total) by mouth daily.   ONE TOUCH ULTRA TEST test strip Generic drug:  glucose blood USE TO CHECK BLOOD SUGAR EACH MORNING AND THEN 2 HOURS AFTER ANY MEAL   ONETOUCH DELICA LANCETS 68T Misc USE TO CHECK BLOOD SUGAR TWICE DAILY AS DIRECTED   OXYGEN Inhale 2 L into the lungs daily.   potassium chloride SA 20 MEQ tablet Commonly known as:  K-DUR,KLOR-CON Take 1 tablet (20 mEq total) by mouth every other day. Take with lasix. Start taking on:  09/26/2017 What changed:    how much to take  how to take this  when to take this  additional instructions   pravastatin 80 MG tablet Commonly known as:  PRAVACHOL Take 1 tablet (80 mg total) by mouth at bedtime.       Procedures/Studies: Dg Chest Port 1 View  Result Date: 09/22/2017 CLINICAL DATA:  Bilateral lower extremity swelling and redness as well as abdominal swelling. EXAM: PORTABLE CHEST 1 VIEW COMPARISON:  03/04/2017 and 07/24/2016 FINDINGS: Lungs are adequately inflated with prominence of the perihilar markings with slightly more confluent opacification in the right infrahilar region. No evidence of effusion. Cardiomediastinal silhouette is within normal. There is minimal calcified plaque over the aortic arch. Remainder of the exam is unchanged. IMPRESSION: Findings suggesting mild vascular congestion. Slightly more confluent opacification in the right infrahilar region as early infection is possible. Electronically Signed   By: Marin Olp M.D.   On: 09/22/2017 16:45   Dg Abd Portable 1v  Result Date: 09/23/2017 CLINICAL DATA:  Abdominal swelling. EXAM: PORTABLE ABDOMEN - 1 VIEW COMPARISON:  Single-view of the abdomen 02/25/2017. FINDINGS: The bowel gas pattern is normal. No radio-opaque calculi or other significant radiographic abnormality are seen. IMPRESSION: Negative exam. Electronically Signed   By: Inge Rise M.D.   On: 09/23/2017 12:40       Echocardiogram ------------------------------------------------------------------- Study Conclusions  - Left ventricle: The cavity size was normal. Wall thickness was  increased in a pattern of mild LVH. Systolic function was  vigorous. The estimated ejection fraction was in the range of 65%  to 70%. Wall motion was normal; there were no regional wall motion abnormalities. Doppler parameters are consistent with abnormal left ventricular relaxation (grade 1 diastolic dysfunction).  Subjective: Pt says he feels a lot better.  He has diuresed well.  He had a BM last night.  He wants to go home.    Discharge Exam: Vitals:   09/24/17 0756 09/24/17 0801  BP:    Pulse:    Resp:    Temp:    SpO2: 95% 98%   Vitals:   09/23/17 2133 09/24/17 0236 09/24/17 0756 09/24/17 0801  BP:      Pulse: 74     Resp: 18     Temp:      TempSrc:      SpO2: 98% 96% 95% 98%  Weight:      Height:       General exam: chronically ill appearing male, NAD. Cooperative.  Respiratory system: diffuse exp wheezing bilateral. No increased work of breathing. Cardiovascular system: S1 & S2 heard.   Gastrointestinal system: Abdomen is nondistended, soft and nontender. Normal bowel sounds heard. Central nervous system: Alert and oriented. No focal neurological deficits. Extremities: trace pretibial edema bilateral LEs.   The results of significant diagnostics from this hospitalization (including imaging, microbiology, ancillary and laboratory) are listed below for reference.  Microbiology: Recent Results (from the past 240 hour(s))  MRSA PCR Screening     Status: None   Collection Time: 09/22/17  8:29 PM  Result Value Ref Range Status   MRSA by PCR NEGATIVE NEGATIVE Final    Comment:        The GeneXpert MRSA Assay (FDA approved for NASAL specimens only), is one component of a comprehensive MRSA colonization surveillance program. It is not intended to diagnose MRSA infection nor to guide or monitor  treatment for MRSA infections.      Labs: BNP (last 3 results) Recent Labs    03/21/17 1513 09/22/17 1608  BNP 121.5* 353.6*   Basic Metabolic Panel: Recent Labs  Lab 09/22/17 1628 09/23/17 0420 09/24/17 0505  NA 132* 139 142  K 5.0 3.7 4.2  CL 97* 102 101  CO2  --  29 29  GLUCOSE 203* 52* 70  BUN 25* 20 22*  CREATININE 1.00 1.09 1.29*  CALCIUM  --  8.7* 9.0   Liver Function Tests: No results for input(s): AST, ALT, ALKPHOS, BILITOT, PROT, ALBUMIN in the last 168 hours. No results for input(s): LIPASE, AMYLASE in the last 168 hours. No results for input(s): AMMONIA in the last 168 hours. CBC: Recent Labs  Lab 09/22/17 1608 09/22/17 1628  WBC 10.4  --   NEUTROABS 6.4  --   HGB 9.4* 9.9*  HCT 30.8* 29.0*  MCV 77.0*  --   PLT 456*  --    Cardiac Enzymes: No results for input(s): CKTOTAL, CKMB, CKMBINDEX, TROPONINI in the last 168 hours. BNP: Invalid input(s): POCBNP CBG: Recent Labs  Lab 09/23/17 1627 09/23/17 2232 09/24/17 0306 09/24/17 0742 09/24/17 0810  GLUCAP 124* 148* 128* 46* 102*   D-Dimer No results for input(s): DDIMER in the last 72 hours. Hgb A1c No results for input(s): HGBA1C in the last 72 hours. Lipid Profile No results for input(s): CHOL, HDL, LDLCALC, TRIG, CHOLHDL, LDLDIRECT in the last 72 hours. Thyroid function studies No results for input(s): TSH, T4TOTAL, T3FREE, THYROIDAB in the last 72 hours.  Invalid input(s): FREET3 Anemia work up No results for input(s): VITAMINB12, FOLATE, FERRITIN, TIBC, IRON, RETICCTPCT in the last 72 hours. Urinalysis    Component Value Date/Time   COLORURINE YELLOW 02/25/2017 1000   APPEARANCEUR CLEAR 02/25/2017 1000   LABSPEC <1.005 (L) 02/25/2017 1000   PHURINE 5.5 02/25/2017 1000   GLUCOSEU >=500 (A) 02/25/2017 1000   HGBUR TRACE (A) 02/25/2017 1000   BILIRUBINUR NEGATIVE 02/25/2017 1000   KETONESUR NEGATIVE 02/25/2017 1000   PROTEINUR TRACE (A) 02/25/2017 1000   NITRITE NEGATIVE  02/25/2017 1000   LEUKOCYTESUR NEGATIVE 02/25/2017 1000   Sepsis Labs Invalid input(s): PROCALCITONIN,  WBC,  LACTICIDVEN Microbiology Recent Results (from the past 240 hour(s))  MRSA PCR Screening     Status: None   Collection Time: 09/22/17  8:29 PM  Result Value Ref Range Status   MRSA by PCR NEGATIVE NEGATIVE Final    Comment:        The GeneXpert MRSA Assay (FDA approved for NASAL specimens only), is one component of a comprehensive MRSA colonization surveillance program. It is not intended to diagnose MRSA infection nor to guide or monitor treatment for MRSA infections.    Time coordinating discharge: 35 mins  SIGNED:  Irwin Brakeman, MD  [facility] 09/24/2017, 9:52 AM Pager [phone]  If 7PM-7AM, please contact night-coverage [email or web address] Password [email or web address]

## 2017-09-24 NOTE — Evaluation (Signed)
Physical Therapy Evaluation Patient Details Name: Edward [name] MRN: [id number] DOB: [birth year] Today's Date: 09/24/2017   History of Present Illness  Edward [name] is a 82 y.o. male with a history of diabetes type 2 on insulin, grade 2 diastolic heart failure on echocardiogram 02/26/2017 with preserved EF, chronic respiratory failure on oxygen at home, COPD, hyperlipidemia, hypertension, atrial fibrillation on anticoagulation with a chads 2 vascular score of 5.  Patient saw his primary care provider in the middle of December (09/12/17).  The patient states that his PCP took him off his Lasix and his diabetes medication, however per her notes it appears that she only stopped his metformin.  She was uncertain of whether he was taking the Lasix.  Since that appointment, the patient has not taken his Lasix and began to have worsening breathing and worsening leg swelling.  He presented to the hospital for evaluation today.  Respiratory status worse with exertion and improved with rest.    Clinical Impression  Patient functioning at baseline for functional mobility and gait.  Patient to be discharged to home today and discharged from physical therapy to care of nursing.    Follow Up Recommendations No PT follow up    Equipment Recommendations  None recommended by PT    Recommendations for Other Services       Precautions / Restrictions Precautions Precautions: None Restrictions Weight Bearing Restrictions: No      Mobility  Bed Mobility Overal bed mobility: Independent                Transfers Overall transfer level: Independent                  Ambulation/Gait Ambulation/Gait assistance: Modified independent (Device/Increase time) Ambulation Distance (Feet): 125 Feet Assistive device: Rolling walker (2 wheeled) Gait Pattern/deviations: WFL(Within Functional Limits)   Gait velocity interpretation: at or above normal speed for age/gender General Gait Details: no  loss of balance and on 3 LPM O2 during gait training  Stairs            Wheelchair Mobility    Modified Rankin (Stroke Patients Only)       Balance Overall balance assessment: No apparent balance deficits (not formally assessed)                                           Pertinent Vitals/Pain Pain Assessment: No/denies pain    Home Living Family/patient expects to be discharged to:: Private residence Living Arrangements: Spouse/significant other;Children Available Help at Discharge: Available 24 hours/day Type of Home: House Home Access: Level entry     Home Layout: One level Home Equipment: Environmental consultant - 2 wheels      Prior Function Level of Independence: Independent with assistive device(s)               Hand Dominance        Extremity/Trunk Assessment   Upper Extremity Assessment Upper Extremity Assessment: Overall WFL for tasks assessed    Lower Extremity Assessment Lower Extremity Assessment: Overall WFL for tasks assessed    Cervical / Trunk Assessment Cervical / Trunk Assessment: Normal  Communication   Communication: No difficulties  Cognition  General Comments      Exercises     Assessment/Plan    PT Assessment Patent does not need any further PT services  PT Problem List         PT Treatment Interventions      PT Goals (Current goals can be found in the Care Plan section)  Acute Rehab PT Goals Patient Stated Goal: return home PT Goal Formulation: With patient Time For Goal Achievement: 09/24/17 Potential to Achieve Goals: Good    Frequency     Barriers to discharge        Co-evaluation               AM-PAC PT "6 Clicks" Daily Activity  Outcome Measure Difficulty turning over in bed (including adjusting bedclothes, sheets and blankets)?: None Difficulty moving from lying on back to sitting on the side of the bed? :  None Difficulty sitting down on and standing up from a chair with arms (e.g., wheelchair, bedside commode, etc,.)?: None Help needed moving to and from a bed to chair (including a wheelchair)?: None Help needed walking in hospital room?: None Help needed climbing 3-5 steps with a railing? : None 6 Click Score: 24    End of Session Equipment Utilized During Treatment: Gait belt Activity Tolerance: Patient tolerated treatment well Patient left: in chair;with call bell/phone within reach Nurse Communication: Mobility status PT Visit Diagnosis: Unsteadiness on feet (R26.81);Other abnormalities of gait and mobility (R26.89);Muscle weakness (generalized) (M62.81)    Time: 1448-1856 PT Time Calculation (min) (ACUTE ONLY): 30 min   Charges:   PT Evaluation $PT Eval Low Complexity: 1 Low PT Treatments $Therapeutic Activity: 23-37 mins   PT G Codes:   PT G-Codes **NOT FOR INPATIENT CLASS** Functional Assessment Tool Used: AM-PAC 6 Clicks Basic Mobility Functional Limitation: Mobility: Walking and moving around Mobility: Walking and Moving Around Current Status (D1497): 0 percent impaired, limited or restricted Mobility: Walking and Moving Around Goal Status (W2637): 0 percent impaired, limited or restricted Mobility: Walking and Moving Around Discharge Status (C5885): 0 percent impaired, limited or restricted    11:12 AM, 09/24/17 Lonell Grandchild, MPT Physical Therapist with [facility] 336 [phone] office [phone] mobile phone

## 2017-09-24 NOTE — Progress Notes (Signed)
Pt discharged in stable condition via wheelchair into the care of his family via private vehicle.  Discharge instructions reviewed with pt/family. Pt/family verbalized understanding.

## 2017-09-25 ENCOUNTER — Encounter (HOSPITAL_COMMUNITY): Payer: Self-pay

## 2017-09-25 ENCOUNTER — Emergency Department (HOSPITAL_COMMUNITY): Payer: Medicare Other

## 2017-09-25 ENCOUNTER — Observation Stay (HOSPITAL_COMMUNITY)
Admission: EM | Admit: 2017-09-25 | Discharge: 2017-09-26 | Disposition: A | Discharge status: Home / Self Care | Payer: Medicare Other | Attending: Family Medicine | Admitting: Family Medicine

## 2017-09-25 DIAGNOSIS — R410 Disorientation, unspecified: Principal | ICD-10-CM

## 2017-09-25 DIAGNOSIS — Z794 Long term (current) use of insulin: Secondary | ICD-10-CM | POA: Insufficient documentation

## 2017-09-25 DIAGNOSIS — G9340 Encephalopathy, unspecified: Secondary | ICD-10-CM | POA: Diagnosis not present

## 2017-09-25 DIAGNOSIS — Z79899 Other long term (current) drug therapy: Secondary | ICD-10-CM | POA: Diagnosis not present

## 2017-09-25 DIAGNOSIS — Z87891 Personal history of nicotine dependence: Secondary | ICD-10-CM | POA: Diagnosis not present

## 2017-09-25 DIAGNOSIS — N183 Chronic kidney disease, stage 3 unspecified: Secondary | ICD-10-CM | POA: Diagnosis present

## 2017-09-25 DIAGNOSIS — L03116 Cellulitis of left lower limb: Secondary | ICD-10-CM | POA: Insufficient documentation

## 2017-09-25 DIAGNOSIS — I5032 Chronic diastolic (congestive) heart failure: Secondary | ICD-10-CM | POA: Diagnosis not present

## 2017-09-25 DIAGNOSIS — J449 Chronic obstructive pulmonary disease, unspecified: Secondary | ICD-10-CM | POA: Insufficient documentation

## 2017-09-25 DIAGNOSIS — R739 Hyperglycemia, unspecified: Secondary | ICD-10-CM | POA: Diagnosis not present

## 2017-09-25 DIAGNOSIS — R402411 Glasgow coma scale score 13-15, in the field [EMT or ambulance]: Secondary | ICD-10-CM | POA: Diagnosis not present

## 2017-09-25 DIAGNOSIS — E7800 Pure hypercholesterolemia, unspecified: Secondary | ICD-10-CM | POA: Diagnosis not present

## 2017-09-25 DIAGNOSIS — L03115 Cellulitis of right lower limb: Secondary | ICD-10-CM | POA: Diagnosis not present

## 2017-09-25 DIAGNOSIS — Z7901 Long term (current) use of anticoagulants: Secondary | ICD-10-CM | POA: Diagnosis not present

## 2017-09-25 DIAGNOSIS — R600 Localized edema: Secondary | ICD-10-CM | POA: Diagnosis not present

## 2017-09-25 DIAGNOSIS — I480 Paroxysmal atrial fibrillation: Secondary | ICD-10-CM

## 2017-09-25 DIAGNOSIS — Z9981 Dependence on supplemental oxygen: Secondary | ICD-10-CM

## 2017-09-25 DIAGNOSIS — J439 Emphysema, unspecified: Secondary | ICD-10-CM

## 2017-09-25 DIAGNOSIS — I639 Cerebral infarction, unspecified: Secondary | ICD-10-CM

## 2017-09-25 DIAGNOSIS — E1122 Type 2 diabetes mellitus with diabetic chronic kidney disease: Secondary | ICD-10-CM | POA: Insufficient documentation

## 2017-09-25 DIAGNOSIS — E1165 Type 2 diabetes mellitus with hyperglycemia: Secondary | ICD-10-CM

## 2017-09-25 DIAGNOSIS — Z9114 Patient's other noncompliance with medication regimen: Secondary | ICD-10-CM

## 2017-09-25 DIAGNOSIS — IMO0002 Reserved for concepts with insufficient information to code with codable children: Secondary | ICD-10-CM | POA: Diagnosis present

## 2017-09-25 DIAGNOSIS — J9610 Chronic respiratory failure, unspecified whether with hypoxia or hypercapnia: Secondary | ICD-10-CM | POA: Diagnosis present

## 2017-09-25 DIAGNOSIS — J9611 Chronic respiratory failure with hypoxia: Secondary | ICD-10-CM | POA: Diagnosis not present

## 2017-09-25 DIAGNOSIS — I130 Hypertensive heart and chronic kidney disease with heart failure and stage 1 through stage 4 chronic kidney disease, or unspecified chronic kidney disease: Secondary | ICD-10-CM | POA: Diagnosis not present

## 2017-09-25 DIAGNOSIS — R4182 Altered mental status, unspecified: Secondary | ICD-10-CM | POA: Diagnosis present

## 2017-09-25 HISTORY — DX: Patient's noncompliance with other medical treatment and regimen: Z91.19

## 2017-09-25 HISTORY — DX: Patient's noncompliance with other medical treatment and regimen due to unspecified reason: Z91.199

## 2017-09-25 LAB — CBC WITH DIFFERENTIAL/PLATELET
BASOS ABS: 0.1 10*3/uL (ref 0.0–0.1)
Basophils Relative: 1 %
EOS PCT: 2 %
Eosinophils Absolute: 0.2 10*3/uL (ref 0.0–0.7)
HEMATOCRIT: 31.5 % — AB (ref 39.0–52.0)
Hemoglobin: 9.3 g/dL — ABNORMAL LOW (ref 13.0–17.0)
LYMPHS PCT: 13 %
Lymphs Abs: 0.9 10*3/uL (ref 0.7–4.0)
MCH: 23.3 pg — ABNORMAL LOW (ref 26.0–34.0)
MCHC: 29.5 g/dL — ABNORMAL LOW (ref 30.0–36.0)
MCV: 78.9 fL (ref 78.0–100.0)
Monocytes Absolute: 0.8 10*3/uL (ref 0.1–1.0)
Monocytes Relative: 10 %
NEUTROS ABS: 5.4 10*3/uL (ref 1.7–7.7)
Neutrophils Relative %: 74 %
PLATELETS: 515 10*3/uL — AB (ref 150–400)
RBC: 3.99 MIL/uL — ABNORMAL LOW (ref 4.22–5.81)
RDW: 17.5 % — ABNORMAL HIGH (ref 11.5–15.5)
WBC: 7.4 10*3/uL (ref 4.0–10.5)

## 2017-09-25 LAB — TSH: TSH: 3.123 u[IU]/mL (ref 0.350–4.500)

## 2017-09-25 LAB — URINALYSIS, ROUTINE W REFLEX MICROSCOPIC
BILIRUBIN URINE: NEGATIVE
Bacteria, UA: NONE SEEN
Glucose, UA: >=500 mg/dL — AB
Hgb urine dipstick: NEGATIVE
KETONES UR: 5 mg/dL — AB
LEUKOCYTES UA: NEGATIVE
Nitrite: NEGATIVE
PH: 5 (ref 5.0–8.0)
Protein, ur: 100 mg/dL — AB
Specific Gravity, Urine: 1.014 (ref 1.005–1.030)

## 2017-09-25 LAB — COMPREHENSIVE METABOLIC PANEL
ALBUMIN: 3.4 g/dL — AB (ref 3.5–5.0)
ALK PHOS: 107 U/L (ref 38–126)
ALT: 17 U/L (ref 17–63)
AST: 18 U/L (ref 15–41)
Anion gap: 10 (ref 5–15)
BUN: 29 mg/dL — ABNORMAL HIGH (ref 6–20)
CALCIUM: 8.8 mg/dL — AB (ref 8.9–10.3)
CO2: 30 mmol/L (ref 22–32)
CREATININE: 1.62 mg/dL — AB (ref 0.61–1.24)
Chloride: 101 mmol/L (ref 101–111)
GFR calc Af Amer: 45 mL/min — ABNORMAL LOW (ref >60)
GFR calc non Af Amer: 38 mL/min — ABNORMAL LOW (ref >60)
GLUCOSE: 417 mg/dL — AB (ref 65–99)
Potassium: 4.9 mmol/L (ref 3.5–5.1)
SODIUM: 141 mmol/L (ref 135–145)
Total Bilirubin: 0.5 mg/dL (ref 0.3–1.2)
Total Protein: 7 g/dL (ref 6.5–8.1)

## 2017-09-25 LAB — AMMONIA: Ammonia: 17 umol/L (ref 9–35)

## 2017-09-25 LAB — GLUCOSE, CAPILLARY
GLUCOSE-CAPILLARY: 321 mg/dL — AB (ref 65–99)
Glucose-Capillary: 205 mg/dL — ABNORMAL HIGH (ref 65–99)

## 2017-09-25 LAB — ETHANOL: Alcohol, Ethyl (B): <10 mg/dL (ref <10)

## 2017-09-25 LAB — SALICYLATE LEVEL: Salicylate Lvl: <7 mg/dL (ref 2.8–30.0)

## 2017-09-25 LAB — RAPID URINE DRUG SCREEN, HOSP PERFORMED
AMPHETAMINES: NOT DETECTED
BENZODIAZEPINES: NOT DETECTED
Barbiturates: NOT DETECTED
COCAINE: NOT DETECTED
OPIATES: NOT DETECTED
TETRAHYDROCANNABINOL: NOT DETECTED

## 2017-09-25 LAB — VITAMIN B12: Vitamin B-12: 276 pg/mL (ref 180–914)

## 2017-09-25 LAB — ACETAMINOPHEN LEVEL: Acetaminophen (Tylenol), Serum: <10 ug/mL — ABNORMAL LOW (ref 10–30)

## 2017-09-25 LAB — CBG MONITORING, ED: Glucose-Capillary: 393 mg/dL — ABNORMAL HIGH (ref 65–99)

## 2017-09-25 LAB — BRAIN NATRIURETIC PEPTIDE: B Natriuretic Peptide: 142 pg/mL — ABNORMAL HIGH (ref 0.0–100.0)

## 2017-09-25 LAB — SEDIMENTATION RATE: Sed Rate: 56 mm/hr — ABNORMAL HIGH (ref 0–16)

## 2017-09-25 LAB — LACTIC ACID, PLASMA
LACTIC ACID, VENOUS: 1.4 mmol/L (ref 0.5–1.9)
Lactic Acid, Venous: 1.2 mmol/L (ref 0.5–1.9)

## 2017-09-25 LAB — FOLATE: Folate: 18 ng/mL (ref >5.9)

## 2017-09-25 LAB — TROPONIN I: Troponin I: 0.03 ng/mL (ref <0.03)

## 2017-09-25 MED ORDER — POTASSIUM CHLORIDE CRYS ER 20 MEQ PO TBCR
20.0000 meq | EXTENDED_RELEASE_TABLET | ORAL | Status: DC
  Administered 2017-09-26: 20 meq via ORAL
  Filled 2017-09-25: qty 1

## 2017-09-25 MED ORDER — SODIUM CHLORIDE 0.9 % IV SOLN
INTRAVENOUS | Status: DC
  Administered 2017-09-25: 18:00:00 via INTRAVENOUS

## 2017-09-25 MED ORDER — BUDESONIDE 0.5 MG/2ML IN SUSP: 0.5000 mg | Freq: Two times a day (BID) | RESPIRATORY_TRACT | Status: DC

## 2017-09-25 MED ORDER — APIXABAN 2.5 MG PO TABS
2.5000 mg | ORAL_TABLET | Freq: Two times a day (BID) | ORAL | Status: DC
  Administered 2017-09-25 – 2017-09-26 (×2): 2.5 mg via ORAL
  Filled 2017-09-25 (×3): qty 1

## 2017-09-25 MED ORDER — INSULIN ASPART 100 UNIT/ML ~~LOC~~ SOLN
2.0000 [IU] | Freq: Three times a day (TID) | SUBCUTANEOUS | Status: DC
  Administered 2017-09-25: 2 [IU] via SUBCUTANEOUS

## 2017-09-25 MED ORDER — ACETAMINOPHEN 650 MG RE SUPP: 650.0000 mg | RECTAL | Status: DC | PRN

## 2017-09-25 MED ORDER — METOPROLOL TARTRATE 25 MG PO TABS
25.0000 mg | ORAL_TABLET | Freq: Two times a day (BID) | ORAL | Status: DC
  Administered 2017-09-25 – 2017-09-26 (×2): 25 mg via ORAL
  Filled 2017-09-25 (×3): qty 1

## 2017-09-25 MED ORDER — INSULIN NPH (HUMAN) (ISOPHANE) 100 UNIT/ML ~~LOC~~ SUSP
5.0000 [IU] | Freq: Two times a day (BID) | SUBCUTANEOUS | Status: DC
  Administered 2017-09-25: 5 [IU] via SUBCUTANEOUS
  Filled 2017-09-25: qty 10

## 2017-09-25 MED ORDER — SODIUM CHLORIDE 0.9 % IV BOLUS (SEPSIS)
500.0000 mL | Freq: Once | INTRAVENOUS | Status: AC
  Administered 2017-09-25: 500 mL via INTRAVENOUS

## 2017-09-25 MED ORDER — LOSARTAN POTASSIUM 50 MG PO TABS
100.0000 mg | ORAL_TABLET | Freq: Every day | ORAL | Status: DC
  Administered 2017-09-26: 100 mg via ORAL
  Filled 2017-09-25 (×2): qty 2

## 2017-09-25 MED ORDER — CEFAZOLIN SODIUM-DEXTROSE 1-4 GM/50ML-% IV SOLN
1.0000 g | Freq: Once | INTRAVENOUS | Status: DC
  Administered 2017-09-25: 1 g via INTRAVENOUS
  Filled 2017-09-25: qty 50

## 2017-09-25 MED ORDER — LORAZEPAM 2 MG/ML IJ SOLN: 1.0000 mg | Freq: Once | INTRAMUSCULAR | Status: DC | PRN

## 2017-09-25 MED ORDER — SODIUM CHLORIDE 0.9 % IV SOLN
INTRAVENOUS | Status: DC
  Administered 2017-09-25: 14:00:00 via INTRAVENOUS

## 2017-09-25 MED ORDER — IPRATROPIUM BROMIDE 0.02 % IN SOLN: 0.5000 mg | Freq: Four times a day (QID) | RESPIRATORY_TRACT | Status: DC

## 2017-09-25 MED ORDER — MIRTAZAPINE 30 MG PO TABS: 30.0000 mg | ORAL_TABLET | Freq: Every day | ORAL | Status: DC

## 2017-09-25 MED ORDER — ACETAMINOPHEN 325 MG PO TABS: 650.0000 mg | ORAL_TABLET | ORAL | Status: DC | PRN

## 2017-09-25 MED ORDER — ACETAMINOPHEN 160 MG/5ML PO SOLN: 650.0000 mg | ORAL | Status: DC | PRN

## 2017-09-25 MED ORDER — INSULIN ASPART 100 UNIT/ML ~~LOC~~ SOLN
0.0000 [IU] | Freq: Three times a day (TID) | SUBCUTANEOUS | Status: DC
  Administered 2017-09-25: 7 [IU] via SUBCUTANEOUS

## 2017-09-25 MED ORDER — IPRATROPIUM BROMIDE 0.02 % IN SOLN
0.5000 mg | Freq: Four times a day (QID) | RESPIRATORY_TRACT | Status: DC
  Administered 2017-09-25 – 2017-09-26 (×2): 0.5 mg via RESPIRATORY_TRACT
  Filled 2017-09-25 (×2): qty 2.5

## 2017-09-25 MED ORDER — HYDRALAZINE HCL 20 MG/ML IJ SOLN: 10.0000 mg | INTRAMUSCULAR | Status: DC | PRN

## 2017-09-25 MED ORDER — INSULIN ASPART 100 UNIT/ML ~~LOC~~ SOLN
5.0000 [IU] | Freq: Once | SUBCUTANEOUS | Status: AC
  Administered 2017-09-25: 5 [IU] via SUBCUTANEOUS
  Filled 2017-09-25: qty 1

## 2017-09-25 MED ORDER — STROKE: EARLY STAGES OF RECOVERY BOOK
Freq: Once | Status: DC
  Filled 2017-09-25: qty 1

## 2017-09-25 MED ORDER — PRAVASTATIN SODIUM 40 MG PO TABS
80.0000 mg | ORAL_TABLET | Freq: Every day | ORAL | Status: DC
  Administered 2017-09-26: 80 mg via ORAL
  Filled 2017-09-25 (×3): qty 1

## 2017-09-25 MED ORDER — PANTOPRAZOLE SODIUM 40 MG PO TBEC
40.0000 mg | DELAYED_RELEASE_TABLET | Freq: Every day | ORAL | Status: DC
  Administered 2017-09-26: 40 mg via ORAL
  Filled 2017-09-25 (×2): qty 1

## 2017-09-25 MED ORDER — DILTIAZEM HCL ER COATED BEADS 240 MG PO CP24
240.0000 mg | ORAL_CAPSULE | Freq: Every day | ORAL | Status: DC
  Administered 2017-09-26: 240 mg via ORAL
  Filled 2017-09-25: qty 1

## 2017-09-25 MED ORDER — INSULIN ASPART 100 UNIT/ML ~~LOC~~ SOLN
SUBCUTANEOUS | Status: AC
  Filled 2017-09-25: qty 1

## 2017-09-25 MED ORDER — SENNOSIDES-DOCUSATE SODIUM 8.6-50 MG PO TABS
1.0000 | ORAL_TABLET | Freq: Every evening | ORAL | Status: DC | PRN
  Filled 2017-09-25: qty 1

## 2017-09-25 MED ORDER — ALBUTEROL SULFATE (2.5 MG/3ML) 0.083% IN NEBU
2.5000 mg | INHALATION_SOLUTION | Freq: Once | RESPIRATORY_TRACT | Status: AC
  Administered 2017-09-25: 2.5 mg via RESPIRATORY_TRACT
  Filled 2017-09-25: qty 3

## 2017-09-25 MED ORDER — BACLOFEN 10 MG PO TABS: 10.0000 mg | ORAL_TABLET | Freq: Three times a day (TID) | ORAL | Status: DC

## 2017-09-25 MED ORDER — INSULIN NPH (HUMAN) (ISOPHANE) 100 UNIT/ML ~~LOC~~ SUSP
SUBCUTANEOUS | Status: AC
  Filled 2017-09-25: qty 10

## 2017-09-25 MED ORDER — FUROSEMIDE 40 MG PO TABS
40.0000 mg | ORAL_TABLET | ORAL | Status: DC
  Administered 2017-09-26: 40 mg via ORAL
  Filled 2017-09-25: qty 1

## 2017-09-25 MED ORDER — IPRATROPIUM BROMIDE 0.02 % IN SOLN
RESPIRATORY_TRACT | Status: AC
  Filled 2017-09-25: qty 2.5

## 2017-09-25 MED ORDER — IPRATROPIUM-ALBUTEROL 0.5-2.5 (3) MG/3ML IN SOLN
3.0000 mL | Freq: Once | RESPIRATORY_TRACT | Status: AC
  Administered 2017-09-25: 3 mL via RESPIRATORY_TRACT
  Filled 2017-09-25: qty 3

## 2017-09-25 NOTE — ED Provider Notes (Signed)
[facility] EMERGENCY DEPARTMENT Provider Note   CSN: [id number] Arrival date & time: 09/25/17  1000     History   Chief Complaint Chief Complaint  Patient presents with  . Altered Mental Status    HPI Edward DAXTEN KOVALENKO is a 82 y.o. male.  The history is provided by the patient, the EMS personnel and a relative. The history is limited by the condition of the patient (AMS).  Altered Mental Status    Pt was seen at 1040. Per EMS and family report: Pt discharged from the hospital yesterday. Family found him confused today: He was walking outside at 0500, then pt's wife saw him packing his belongings at 0600 today. EMS noted multiple meds laying around pt's room and pt was not wearing his O2 N/C. Pt states he gave himself "30 units of insulin like I'm supposed to" this morning (this dose was changed during admission). Pt also believes today is "September" and cannot tell me the current year. Per Epic notes:  [place] [facility] called pt yesterday and made note of pt confusion while speaking with him, as well as family members reporting pt ws "sleeping and not eating" since hospital discharge, not going to go to his f/u doctor appt this week, and it was questionable if pt was checking his CBG's or taking his meds. Pt denies he is confused. Denies CP/SOB, no abd pain, no N/V/D, no back or neck pain, no focal motor weakness, no tingling/numbness in extremities.   Past Medical History:  Diagnosis Date  . Allergy    Rhinitis  . Bronchitis   . Chronic respiratory failure ([place])   . Colon polyps   . COPD (chronic obstructive pulmonary disease) ([place])   . Diabetes mellitus   . Elevated lipids   . Hypercholesterolemia   . Hypertension   . Noncompliance   . On home O2    2L N/C   . PSA elevation   . Pulmonary fibrosis ([place])   . Vitamin D deficiency     Patient Active Problem List   Diagnosis Date Noted  . Atrial fibrillation with RVR ([place]) 09/22/2017  . Bilateral lower extremity edema  06/27/2017  . Insomnia 03/28/2017  . Uncontrolled type 2 diabetes mellitus with hyperglycemia, with long-term current use of insulin ([place]) 03/07/2017  . CKD (chronic kidney disease), stage III ([place]) 03/07/2017  . Palliative care encounter   . Goals of care, counseling/discussion   . Encounter for hospice care discussion   . AF (paroxysmal atrial fibrillation) ([place]) 03/05/2017  . Chronic diastolic CHF (congestive heart failure) ([place]) 03/04/2017  . Atrial fibrillation with rapid ventricular response ([place])   . Chronic diastolic heart failure ([place])   . Constipation 02/25/2017  . Overflow diarrhea/Constipation 02/25/2017  . Diabetes mellitus type 2, uncontrolled, without complications ([place]) 27/78/2423  . Non compliance w medication regimen 12/02/2015  . Other and unspecified hyperlipidemia 05/12/2014  . Elevated LFTs 05/12/2014  . Foot laceration 05/12/2014  . Dyspnea 12/19/2013  . COPD exacerbation ([place]) 11/15/2013  . Atrial fibrillation ([place]) 11/10/2013  . Elevated PSA 01/20/2013  . Diabetes mellitus type 2, uncontrolled ([place])   . COPD (chronic obstructive pulmonary disease) ([place])   . Hypertension   . Allergy   . Elevated lipids   . Pulmonary fibrosis ([place])   . Bronchitis   . Colon polyps   . Colon polyps     Past Surgical History:  Procedure Laterality Date  . CATARACT EXTRACTION W/PHACO  06/25/2012   Procedure: CATARACT EXTRACTION PHACO  AND INTRAOCULAR LENS PLACEMENT (IOC);  Surgeon: Elta Guadeloupe T. Gershon Crane, MD;  Location: [facility];  Service: Ophthalmology;  Laterality: Left;  CDE=19.01  . CATARACT EXTRACTION W/PHACO  07/09/2012   Procedure: CATARACT EXTRACTION PHACO AND INTRAOCULAR LENS PLACEMENT (IOC);  Surgeon: Elta Guadeloupe T. Gershon Crane, MD;  Location: [facility];  Service: Ophthalmology;  Laterality: Right;  CDE: 20.09       Home Medications    Prior to Admission medications   Medication Sig Start Date End Date Taking? Authorizing Provider  albuterol (PROVENTIL) (2.5 MG/3ML) 0.083% nebulizer  solution INHALE 1 VIAL VIA NEBULIZER EVERY 6 HOURS AS NEEDED FOR WHEEZING OR SHORTNESS OF BREATH 07/19/17   Orlena Sheldon, PA-C  apixaban (ELIQUIS) 2.5 MG TABS tablet Take 1 tablet (2.5 mg total) by mouth 2 (two) times daily. 08/30/17   Orlena Sheldon, PA-C  baclofen (LIORESAL) 10 MG tablet Take 10 mg by mouth 3 (three) times daily.    [provider]  budesonide (PULMICORT) 0.5 MG/2ML nebulizer solution Take 2 mLs (0.5 mg total) by nebulization 2 (two) times daily. 07/19/17   Orlena Sheldon, PA-C  diltiazem (CARDIZEM CD) 240 MG 24 hr capsule Take 1 capsule (240 mg total) by mouth daily. 07/19/17   Orlena Sheldon, PA-C  furosemide (LASIX) 40 MG tablet Take 1 tablet (40 mg total) by mouth every other day. 09/26/17 09/26/18  Johnson, Clanford L, MD  insulin NPH Human (NOVOLIN N RELION) 100 UNIT/ML injection Inject 0.05 mLs (5 Units total) into the skin 2 (two) times daily before a meal. 09/24/17   Johnson, Clanford L, MD  ipratropium (ATROVENT) 0.02 % nebulizer solution Take 2.5 mLs (0.5 mg total) by nebulization 4 (four) times daily. 07/19/17   Orlena Sheldon, PA-C  losartan (COZAAR) 100 MG tablet Take 1 tablet (100 mg total) by mouth daily. 07/19/17   Dena Billet B, PA-C  metoprolol tartrate (LOPRESSOR) 25 MG tablet TAKE 1 TABLET BY MOUTH TWICE DAILY 09/03/17   Dena Billet B, PA-C  mirtazapine (REMERON) 30 MG tablet TAKE 1 TABLET BY MOUTH AT BEDTIME 06/28/17   Orlena Sheldon, PA-C  omeprazole (PRILOSEC) 20 MG capsule Take 1 capsule (20 mg total) by mouth daily. 03/23/16   Dena Billet B, PA-C  ONE TOUCH ULTRA TEST test strip USE TO CHECK BLOOD SUGAR EACH MORNING AND THEN 2 HOURS AFTER ANY MEAL 04/23/17   Doren Custard, Stanton Kidney B, PA-C  ONETOUCH DELICA LANCETS 51V MISC USE TO CHECK BLOOD SUGAR TWICE DAILY AS DIRECTED 03/20/17   Dena Billet B, PA-C  OXYGEN Inhale 2 L into the lungs daily.    [provider]  potassium chloride SA (K-DUR,KLOR-CON) 20 MEQ tablet Take 1 tablet (20 mEq total) by mouth every  other day. Take with lasix. 09/26/17   Johnson, Clanford L, MD  pravastatin (PRAVACHOL) 80 MG tablet Take 1 tablet (80 mg total) by mouth at bedtime. 07/19/17   Orlena Sheldon, PA-C    Family History Family History  Problem Relation Age of Onset  . Heart disease Mother   . CAD Other   . Diabetes Other     Social History Social History   Tobacco Use  . Smoking status: Former Smoker    Packs/day: 1.50    Years: 60.00    Pack years: 90.00    Types: Cigarettes    Last attempt to quit: 12/31/2012    Years since quitting: 4.7  . Smokeless tobacco: Never Used  Substance Use Topics  . Alcohol use: No  .  Drug use: No     Allergies   Ace inhibitors   Review of Systems Review of Systems  Unable to perform ROS: Mental status change     Physical Exam Updated Vital Signs BP 130/74   Pulse 100   Temp 98 F (36.7 C)   Resp (!) 21   Wt 90.7 kg (200 lb)   SpO2 99%   BMI 32.28 kg/m   Physical Exam 1045: Physical examination:  Nursing notes reviewed; Vital signs and O2 SAT reviewed;  Constitutional: Well developed, Well nourished, In no acute distress; Head:  Normocephalic, atraumatic; Eyes: EOMI, PERRL, No scleral icterus; ENMT: Mouth and pharynx normal, Mucous membranes dry; Neck: Supple, Full range of motion, No lymphadenopathy; Cardiovascular: Regular rate and rhythm, No gallop; Respiratory: Breath sounds clear & equal bilaterally, exp wheezes.  Speaking full sentences with ease, Normal respiratory effort/excursion; Chest: Nontender, Movement normal; Abdomen: Soft, Nontender, Nondistended, Normal bowel sounds; Genitourinary: No CVA tenderness; Extremities: Pulses normal, No tenderness, +1 pedal edema bilat with erythema to bilat LE's..; Neuro: Awake, alert, confused re: time, place, events. Major CN grossly intact. No facial droop. Speech clear. No gross focal motor or sensory deficits in extremities.; Skin: Color normal, Warm, Dry.    ED Treatments / Results  Labs (all labs  ordered are listed, but only abnormal results are displayed)   EKG  EKG Interpretation  Date/Time:  Tuesday September 25 2017 10:08:22 EST Ventricular Rate:  106 PR Interval:    QRS Duration: 81 QT Interval:  339 QTC Calculation: 451 R Axis:   60 Text Interpretation:  Sinus tachycardia Baseline wander When compared with ECG of 09/22/2017 No significant change was found Confirmed by Francine Graven [phone]) on 09/25/2017 10:51:02 AM       Radiology   Procedures Procedures (including critical care time)  Medications Ordered in ED Medications  albuterol (PROVENTIL) (2.5 MG/3ML) 0.083% nebulizer solution 2.5 mg (not administered)  ipratropium-albuterol (DUONEB) 0.5-2.5 (3) MG/3ML nebulizer solution 3 mL (not administered)     Initial Impression / Assessment and Plan / ED Course  I have reviewed the triage vital signs and the nursing notes.  Pertinent labs & imaging results that were available during my care of the patient were reviewed by me and considered in my medical decision making (see chart for details).  MDM Reviewed: previous chart, nursing note and vitals Reviewed previous: labs and ECG Interpretation: labs, ECG, x-ray and CT scan   Results for orders placed or performed during the hospital encounter of 09/25/17  Acetaminophen level  Result Value Ref Range   Acetaminophen (Tylenol), Serum <10 (L) 10 - 30 ug/mL  Comprehensive metabolic panel  Result Value Ref Range   Sodium 141 135 - 145 mmol/L   Potassium 4.9 3.5 - 5.1 mmol/L   Chloride 101 101 - 111 mmol/L   CO2 30 22 - 32 mmol/L   Glucose, Bld 417 (H) 65 - 99 mg/dL   BUN 29 (H) 6 - 20 mg/dL   Creatinine, Ser 1.62 (H) 0.61 - 1.24 mg/dL   Calcium 8.8 (L) 8.9 - 10.3 mg/dL   Total Protein 7.0 6.5 - 8.1 g/dL   Albumin 3.4 (L) 3.5 - 5.0 g/dL   AST 18 15 - 41 U/L   ALT 17 17 - 63 U/L   Alkaline Phosphatase 107 38 - 126 U/L   Total Bilirubin 0.5 0.3 - 1.2 mg/dL   GFR calc non Af Amer 38 (L) >60 mL/min    GFR calc Af Amer 45 (  L) >60 mL/min   Anion gap 10 5 - 15  Ethanol  Result Value Ref Range   Alcohol, Ethyl (B) <85 <02 mg/dL  Salicylate level  Result Value Ref Range   Salicylate Lvl <7.7 2.8 - 30.0 mg/dL  Brain natriuretic peptide  Result Value Ref Range   B Natriuretic Peptide 142.0 (H) 0.0 - 100.0 pg/mL  Troponin I  Result Value Ref Range   Troponin I 0.03 (HH) <0.03 ng/mL  Lactic acid, plasma  Result Value Ref Range   Lactic Acid, Venous 1.2 0.5 - 1.9 mmol/L  Lactic acid, plasma  Result Value Ref Range   Lactic Acid, Venous 1.4 0.5 - 1.9 mmol/L  CBC with Differential  Result Value Ref Range   WBC 7.4 4.0 - 10.5 K/uL   RBC 3.99 (L) 4.22 - 5.81 MIL/uL   Hemoglobin 9.3 (L) 13.0 - 17.0 g/dL   HCT 31.5 (L) 39.0 - 52.0 %   MCV 78.9 78.0 - 100.0 fL   MCH 23.3 (L) 26.0 - 34.0 pg   MCHC 29.5 (L) 30.0 - 36.0 g/dL   RDW 17.5 (H) 11.5 - 15.5 %   Platelets 515 (H) 150 - 400 K/uL   Neutrophils Relative % 74 %   Neutro Abs 5.4 1.7 - 7.7 K/uL   Lymphocytes Relative 13 %   Lymphs Abs 0.9 0.7 - 4.0 K/uL   Monocytes Relative 10 %   Monocytes Absolute 0.8 0.1 - 1.0 K/uL   Eosinophils Relative 2 %   Eosinophils Absolute 0.2 0.0 - 0.7 K/uL   Basophils Relative 1 %   Basophils Absolute 0.1 0.0 - 0.1 K/uL  Urinalysis, Routine w reflex microscopic  Result Value Ref Range   Color, Urine YELLOW YELLOW   APPearance CLEAR CLEAR   Specific Gravity, Urine 1.014 1.005 - 1.030   pH 5.0 5.0 - 8.0   Glucose, UA >=500 (A) NEGATIVE mg/dL   Hgb urine dipstick NEGATIVE NEGATIVE   Bilirubin Urine NEGATIVE NEGATIVE   Ketones, ur 5 (A) NEGATIVE mg/dL   Protein, ur 100 (A) NEGATIVE mg/dL   Nitrite NEGATIVE NEGATIVE   Leukocytes, UA NEGATIVE NEGATIVE   RBC / HPF 0-5 0 - 5 RBC/hpf   WBC, UA 0-5 0 - 5 WBC/hpf   Bacteria, UA NONE SEEN NONE SEEN   Squamous Epithelial / LPF 0-5 (A) NONE SEEN   Hyaline Casts, UA PRESENT   Urine rapid drug screen (hosp performed)  Result Value Ref Range   Opiates NONE  DETECTED NONE DETECTED   Cocaine NONE DETECTED NONE DETECTED   Benzodiazepines NONE DETECTED NONE DETECTED   Amphetamines NONE DETECTED NONE DETECTED   Tetrahydrocannabinol NONE DETECTED NONE DETECTED   Barbiturates NONE DETECTED NONE DETECTED  CBG monitoring, ED  Result Value Ref Range   Glucose-Capillary 393 (H) 65 - 99 mg/dL   Dg Chest 2 View Result Date: 09/25/2017 CLINICAL DATA:  Altered mental status EXAM: CHEST  2 VIEW COMPARISON:  09/22/2017 FINDINGS: Heart and mediastinal contours are within normal limits. No focal opacities or effusions. No acute bony abnormality. IMPRESSION: No active cardiopulmonary disease. Electronically Signed   By: Rolm Baptise M.D.   On: 09/25/2017 11:17   Ct Head Wo Contrast Result Date: 09/25/2017 CLINICAL DATA:  Altered mental status EXAM: CT HEAD WITHOUT CONTRAST TECHNIQUE: Contiguous axial images were obtained from the base of the skull through the vertex without intravenous contrast. COMPARISON:  None. FINDINGS: Brain: Age related volume loss. Mild chronic small vessel disease. No acute intracranial abnormality.  Specifically, no hemorrhage, hydrocephalus, mass lesion, acute infarction, or significant intracranial injury. Vascular: No hyperdense vessel or unexpected calcification. Skull: No acute calvarial abnormality. Sinuses/Orbits: Visualized paranasal sinuses and mastoids clear. Orbital soft tissues unremarkable. Other: None IMPRESSION: No acute intracranial abnormality. Atrophy, chronic microvascular disease. Electronically Signed   By: Rolm Baptise M.D.   On: 09/25/2017 11:37   Results for LABRON, BLOODGOOD (MRN [id number]) as of 09/25/2017 12:55  Ref. Range 09/23/2017 04:20 09/24/2017 05:05 09/25/2017 11:04  BUN Latest Ref Range: 6 - 20 mg/dL 20 22 (H) 29 (H)  Creatinine Latest Ref Range: 0.61 - 1.24 mg/dL 1.09 1.29 (H) 1.62 (H)    1330:  H/H per baseline. Short neb given for wheezing. SQ insulin given for elevated CBG, pt is not acidotic. IVF given  for elevated BUN/Cr.  Pt clearly confused, possibly since yesterday. Remains afebrile with stable VS. Continues confused; without focal deficits. No family at bedside during ED visit. Will start abx for erythema bilat LE's.  Will admit. T/C to [facility] Dr. Wynetta Emery, case discussed, including:  HPI, pertinent PM/SHx, VS/PE, dx testing, ED course and treatment:  Agreeable to admit.     Final Clinical Impressions(s) / ED Diagnoses   Final diagnoses:  None    ED Discharge Orders    None        Francine Graven, DO 09/26/17 1938

## 2017-09-25 NOTE — ED Notes (Signed)
Edward [name]

## 2017-09-25 NOTE — ED Notes (Signed)
Date and time results received: 09/25/17 1218   Test: Troponin Critical Value: 0.03  Name of Provider Notified: Dr. Thurnell Garbe  Orders Received? Or Actions Taken?: No new orders given.

## 2017-09-25 NOTE — ED Notes (Signed)
Pt transported to CT and x-ray  

## 2017-09-25 NOTE — ED Triage Notes (Addendum)
Pt discharged from hosp yesterday. noted to be confused. Family member found walking outside at 21 am. And at 21 am wife found him packing belongings. O2 off. Multiple meds laying around room. States he gave himself his insulin. CBG 380 sats 90 % on room air. Abdomen distended. Denies pain. EMS since started back on O2 not pt is making more sense

## 2017-09-25 NOTE — H&P (Addendum)
History and Physical  Edward [name] ZOX:[id number] DOB: 11/27/1934 DOA: 09/25/2017  Referring physician: Thurnell Garbe PCP: Orlena Sheldon, PA-C   Chief Complaint: acute confusional state   HPI: Edward [name] is a 82 y.o. male with PMH detailed below just discharged from hospital yesterday ended up going home and family reports that he became acutely confused and family found him walking outside at 5am. He did not know where he was going or what he was doing.  He was around 6 am packing his belongings. He had taken his oxygen off.   There were multiple medications lying around his room.  He had reported to his wife that he had given himself insulin.  He denies any pain.  He denies SOB or fever.  Wife did not notice any seizure activity.  In addition, the patient says that he has been doing fine.  He denies any of the things that his family is same.  He says that he is absolutely adamant that he will not go to rehab facility.  He says that he is content to go home.  He does not seem to understand that he cannot manage well at home.  He is unsure of what he actually has been taking at home.  There are medications spread all around his room.  He has no other complaints and says that he he would like to go home now.  ED Course: Pt noted to be confused.  Labs unchanged from yesterday.  Normal UA. CT head negative.   Review of Systems: UTO mental status changes.   Past Medical History:  Diagnosis Date  . Allergy    Rhinitis  . Bronchitis   . Chronic respiratory failure ([place])   . Colon polyps   . COPD (chronic obstructive pulmonary disease) ([place])   . Diabetes mellitus   . Elevated lipids   . Hypercholesterolemia   . Hypertension   . On home O2    2L N/C   . PSA elevation   . Pulmonary fibrosis ([place])   . Vitamin D deficiency    Past Surgical History:  Procedure Laterality Date  . CATARACT EXTRACTION W/PHACO  06/25/2012   Procedure: CATARACT EXTRACTION PHACO AND INTRAOCULAR LENS PLACEMENT  (IOC);  Surgeon: Elta Guadeloupe T. Gershon Crane, MD;  Location: [facility];  Service: Ophthalmology;  Laterality: Left;  CDE=19.01  . CATARACT EXTRACTION W/PHACO  07/09/2012   Procedure: CATARACT EXTRACTION PHACO AND INTRAOCULAR LENS PLACEMENT (IOC);  Surgeon: Elta Guadeloupe T. Gershon Crane, MD;  Location: [facility];  Service: Ophthalmology;  Laterality: Right;  CDE: 20.09   Social History:  reports that he quit smoking about 4 years ago. His smoking use included cigarettes. He has a 90.00 pack-year smoking history. he has never used smokeless tobacco. He reports that he does not drink alcohol or use drugs.  Allergies  Allergen Reactions  . Ace Inhibitors Other (See Comments)    Hyperkalemia--07/23/2013:patient states not familiar with the following allergy    Family History  Problem Relation Age of Onset  . Heart disease Mother   . CAD Other   . Diabetes Other     Prior to Admission medications   Medication Sig Start Date End Date Taking? Authorizing Provider  albuterol (PROVENTIL) (2.5 MG/3ML) 0.083% nebulizer solution INHALE 1 VIAL VIA NEBULIZER EVERY 6 HOURS AS NEEDED FOR WHEEZING OR SHORTNESS OF BREATH 07/19/17  Yes Orlena Sheldon, PA-C  apixaban (ELIQUIS) 2.5 MG TABS tablet Take 1 tablet (2.5 mg total) by mouth 2 (two) times  daily. 08/30/17  Yes Dena Billet B, PA-C  baclofen (LIORESAL) 10 MG tablet Take 10 mg by mouth 3 (three) times daily.   Yes [provider]  budesonide (PULMICORT) 0.5 MG/2ML nebulizer solution Take 2 mLs (0.5 mg total) by nebulization 2 (two) times daily. 07/19/17  Yes Orlena Sheldon, PA-C  diltiazem (CARDIZEM CD) 240 MG 24 hr capsule Take 1 capsule (240 mg total) by mouth daily. 07/19/17  Yes Dena Billet B, PA-C  furosemide (LASIX) 40 MG tablet Take 1 tablet (40 mg total) by mouth every other day. 09/26/17 09/26/18 Yes Cutter Passey L, MD  insulin NPH Human (NOVOLIN N RELION) 100 UNIT/ML injection Inject 0.05 mLs (5 Units total) into the skin 2 (two) times daily before a meal. 09/24/17   Yes Nhu Glasby L, MD  ipratropium (ATROVENT) 0.02 % nebulizer solution Take 2.5 mLs (0.5 mg total) by nebulization 4 (four) times daily. 07/19/17  Yes Dena Billet B, PA-C  losartan (COZAAR) 100 MG tablet Take 1 tablet (100 mg total) by mouth daily. 07/19/17  Yes Dena Billet B, PA-C  metoprolol tartrate (LOPRESSOR) 25 MG tablet TAKE 1 TABLET BY MOUTH TWICE DAILY 09/03/17  Yes Dena Billet B, PA-C  mirtazapine (REMERON) 30 MG tablet TAKE 1 TABLET BY MOUTH AT BEDTIME 06/28/17  Yes Dena Billet B, PA-C  omeprazole (PRILOSEC) 20 MG capsule Take 1 capsule (20 mg total) by mouth daily. 03/23/16  Yes Dena Billet B, PA-C  OXYGEN Inhale 2 L into the lungs daily.   Yes [provider]  potassium chloride SA (K-DUR,KLOR-CON) 20 MEQ tablet Take 1 tablet (20 mEq total) by mouth every other day. Take with lasix. 09/26/17  Yes Domonique Cothran L, MD  pravastatin (PRAVACHOL) 80 MG tablet Take 1 tablet (80 mg total) by mouth at bedtime. 07/19/17  Yes Orlena Sheldon, PA-C   Physical Exam: Vitals:   09/25/17 1117 09/25/17 1145 09/25/17 1200 09/25/17 1300  BP:   (!) 159/75 (!) 120/99  Pulse:  (!) 102 96 97  Resp:   17   Temp:      SpO2: 99% 97%  98%  Weight:         General exam: Moderately built and nourished patient, lying comfortably supine on the gurney in no obvious distress.  Head, eyes and ENT: Nontraumatic and normocephalic. Pupils equally reacting to light and accommodation. Oral mucosa moist.  Neck: Supple. No JVD, carotid bruit or thyromegaly.  Lymphatics: No lymphadenopathy.  Respiratory system: Clear to auscultation. No increased work of breathing.  Cardiovascular system: S1 and S2 heard, irregular. No JVD, murmurs, gallops, clicks or pedal edema.  Gastrointestinal system: Abdomen is nondistended, soft and nontender. Normal bowel sounds heard. No organomegaly or masses appreciated.  Central nervous system: Alert and oriented. No focal neurological deficits.  Extremities:  Symmetric 5 x 5 power. Peripheral pulses symmetrically felt. Trace pretibial edema BLEs.  Skin: No rashes or acute findings. Wrinkled skin BLEs from recent fluid losses.   Musculoskeletal system: Negative exam.  Psychiatry: Pleasant and cooperative.  Labs on Admission:  Basic Metabolic Panel: Recent Labs  Lab 09/22/17 1628 09/23/17 0420 09/24/17 0505 09/25/17 1104  NA 132* 139 142 141  K 5.0 3.7 4.2 4.9  CL 97* 102 101 101  CO2  --  29 29 30   GLUCOSE 203* 52* 70 417*  BUN 25* 20 22* 29*  CREATININE 1.00 1.09 1.29* 1.62*  CALCIUM  --  8.7* 9.0 8.8*   Liver Function Tests: Recent Labs  Lab  09/25/17 1104  AST 18  ALT 17  ALKPHOS 107  BILITOT 0.5  PROT 7.0  ALBUMIN 3.4*   No results for input(s): LIPASE, AMYLASE in the last 168 hours. No results for input(s): AMMONIA in the last 168 hours. CBC: Recent Labs  Lab 09/22/17 1608 09/22/17 1628 09/25/17 1104  WBC 10.4  --  7.4  NEUTROABS 6.4  --  5.4  HGB 9.4* 9.9* 9.3*  HCT 30.8* 29.0* 31.5*  MCV 77.0*  --  78.9  PLT 456*  --  515*   Cardiac Enzymes: Recent Labs  Lab 09/25/17 1104  TROPONINI 0.03*    BNP (last 3 results) No results for input(s): PROBNP in the last 8760 hours. CBG: Recent Labs  Lab 09/23/17 2232 09/24/17 0306 09/24/17 0742 09/24/17 0810 09/25/17 1122  GLUCAP 148* 128* 46* 102* 393*    Radiological Exams on Admission: Dg Chest 2 View  Result Date: 09/25/2017 CLINICAL DATA:  Altered mental status EXAM: CHEST  2 VIEW COMPARISON:  09/22/2017 FINDINGS: Heart and mediastinal contours are within normal limits. No focal opacities or effusions. No acute bony abnormality. IMPRESSION: No active cardiopulmonary disease. Electronically Signed   By: Rolm Baptise M.D.   On: 09/25/2017 11:17   Ct Head Wo Contrast  Result Date: 09/25/2017 CLINICAL DATA:  Altered mental status EXAM: CT HEAD WITHOUT CONTRAST TECHNIQUE: Contiguous axial images were obtained from the base of the skull through the  vertex without intravenous contrast. COMPARISON:  None. FINDINGS: Brain: Age related volume loss. Mild chronic small vessel disease. No acute intracranial abnormality. Specifically, no hemorrhage, hydrocephalus, mass lesion, acute infarction, or significant intracranial injury. Vascular: No hyperdense vessel or unexpected calcification. Skull: No acute calvarial abnormality. Sinuses/Orbits: Visualized paranasal sinuses and mastoids clear. Orbital soft tissues unremarkable. Other: None IMPRESSION: No acute intracranial abnormality. Atrophy, chronic microvascular disease. Electronically Signed   By: Rolm Baptise M.D.   On: 09/25/2017 11:37    EKG: Independently reviewed. Atrial fibrillation  Assessment/Plan Principal Problem:   Acute encephalopathy Active Problems:   Diabetes mellitus type 2, uncontrolled ([name])   COPD (chronic obstructive pulmonary disease) ([name])   Non compliance w medication regimen   Chronic diastolic CHF (congestive heart failure) ([name])   AF (paroxysmal atrial fibrillation) ([name])   CKD (chronic kidney disease), stage III ([name])   Bilateral lower extremity edema   Acute confusion   1. Acute mental status changes/acute encephalopathy-I am concerned that patient may have had an acute CVA.  We will check an MRI of the brain to rule out acute CVA.  It is clear that he is not taking his medications as prescribed and may have missed some Eliquis.  Admit to telemetry for monitoring.  Neurochecks ordered.  EEG ordered.  Check an ammonia level.  Check a B12 level.  Check a TSH. 2. Diabetes mellitus, type II-poorly controlled, insulin requiring-patient unfortunately is not taking his insulin as prescribed.  His dose was recently reduced to 5 units of NPH twice daily and wife reports that he took much more than that although his blood sugar is greater than 300 so I am not sure if he actually took the insulin.  The patient is a poor historian and is confused at times.  We will resume home  NPH.  Sliding scale coverage has been ordered as well as prandial coverage. 3. Chronic diastolic CHF-recent acute exacerbation treated.  He has been resumed on Lasix 40 mg every other day.  Will monitor fluid balance and weights.  His weight is  essentially unchanged from recent discharge. 4. CKD stage III- creatinine is stable from prior testing. 5. COPD-stable, resume home respiratory medications.  He is on continuous oxygen which will be continued.  6. Noncompliance- well-documented and much evidence of this is a big problem for this patient.  I feel that he needs skilled nursing care at this point however he continues to decline.  Will speak with his family to see if they are in support of placement.  I will consult the social worker to start making arrangements.  In addition, we will ask the case manager to speak with family regarding providing 24/7 caretaker if they plan on taking him home.  He will need 24/7 care and should not be allowed to manage his own medications.  DVT Prophylaxis: apixaban Code Status: DNR  Family Communication: bedside  Disposition Plan: TBD   Time spent: 62 mins  Irwin Brakeman, MD [facility] Pager [phone]  If 7PM-7AM, please contact night-coverage [email or web address] Password [email or web address] 09/25/2017, 1:54 PM

## 2017-09-26 ENCOUNTER — Observation Stay (HOSPITAL_COMMUNITY): Payer: Medicare Other

## 2017-09-26 ENCOUNTER — Observation Stay (HOSPITAL_COMMUNITY)
Admit: 2017-09-26 | Discharge: 2017-09-26 | Disposition: A | Discharge status: Home / Self Care | Payer: Medicare Other | Attending: Family Medicine | Admitting: Family Medicine

## 2017-09-26 ENCOUNTER — Other Ambulatory Visit: Payer: Self-pay

## 2017-09-26 ENCOUNTER — Encounter (HOSPITAL_COMMUNITY): Payer: Self-pay | Admitting: Emergency Medicine

## 2017-09-26 DIAGNOSIS — R569 Unspecified convulsions: Secondary | ICD-10-CM | POA: Diagnosis not present

## 2017-09-26 DIAGNOSIS — I639 Cerebral infarction, unspecified: Secondary | ICD-10-CM | POA: Diagnosis not present

## 2017-09-26 DIAGNOSIS — N183 Chronic kidney disease, stage 3 (moderate): Secondary | ICD-10-CM

## 2017-09-26 DIAGNOSIS — G9340 Encephalopathy, unspecified: Secondary | ICD-10-CM | POA: Diagnosis not present

## 2017-09-26 DIAGNOSIS — I480 Paroxysmal atrial fibrillation: Secondary | ICD-10-CM | POA: Diagnosis not present

## 2017-09-26 DIAGNOSIS — I5032 Chronic diastolic (congestive) heart failure: Secondary | ICD-10-CM | POA: Diagnosis not present

## 2017-09-26 DIAGNOSIS — R600 Localized edema: Secondary | ICD-10-CM | POA: Diagnosis not present

## 2017-09-26 DIAGNOSIS — R410 Disorientation, unspecified: Secondary | ICD-10-CM | POA: Diagnosis not present

## 2017-09-26 LAB — LIPID PANEL
CHOL/HDL RATIO: 3.3 ratio
Cholesterol: 98 mg/dL (ref 0–200)
HDL: 30 mg/dL — AB (ref >40)
LDL CALC: 43 mg/dL (ref 0–99)
Triglycerides: 126 mg/dL (ref <150)
VLDL: 25 mg/dL (ref 0–40)

## 2017-09-26 LAB — COMPREHENSIVE METABOLIC PANEL
ALBUMIN: 3.2 g/dL — AB (ref 3.5–5.0)
ALK PHOS: 94 U/L (ref 38–126)
ALT: 15 U/L — ABNORMAL LOW (ref 17–63)
ANION GAP: 10 (ref 5–15)
AST: 22 U/L (ref 15–41)
BUN: 19 mg/dL (ref 6–20)
CALCIUM: 8.4 mg/dL — AB (ref 8.9–10.3)
CO2: 28 mmol/L (ref 22–32)
Chloride: 102 mmol/L (ref 101–111)
Creatinine, Ser: 1.15 mg/dL (ref 0.61–1.24)
GFR calc non Af Amer: 58 mL/min — ABNORMAL LOW (ref >60)
Glucose, Bld: 116 mg/dL — ABNORMAL HIGH (ref 65–99)
POTASSIUM: 3.8 mmol/L (ref 3.5–5.1)
SODIUM: 140 mmol/L (ref 135–145)
Total Bilirubin: 0.5 mg/dL (ref 0.3–1.2)
Total Protein: 6.3 g/dL — ABNORMAL LOW (ref 6.5–8.1)

## 2017-09-26 LAB — HEMOGLOBIN A1C
Hgb A1c MFr Bld: 11 % — ABNORMAL HIGH (ref 4.8–5.6)
MEAN PLASMA GLUCOSE: 269 mg/dL

## 2017-09-26 LAB — AMMONIA: Ammonia: 16 umol/L (ref 9–35)

## 2017-09-26 MED ORDER — INSULIN NPH (HUMAN) (ISOPHANE) 100 UNIT/ML ~~LOC~~ SUSP
7.0000 [IU] | Freq: Two times a day (BID) | SUBCUTANEOUS | Status: DC
  Administered 2017-09-26: 7 [IU] via SUBCUTANEOUS
  Filled 2017-09-26: qty 10

## 2017-09-26 MED ORDER — INSULIN NPH (HUMAN) (ISOPHANE) 100 UNIT/ML ~~LOC~~ SUSP: 7.0000 [IU] | Freq: Two times a day (BID) | SUBCUTANEOUS | 2 refills | Status: DC

## 2017-09-26 MED ORDER — BUDESONIDE 0.5 MG/2ML IN SUSP
0.5000 mg | Freq: Two times a day (BID) | RESPIRATORY_TRACT | Status: DC
  Administered 2017-09-26: 0.5 mg via RESPIRATORY_TRACT
  Filled 2017-09-26: qty 2

## 2017-09-26 MED ORDER — SENNOSIDES-DOCUSATE SODIUM 8.6-50 MG PO TABS
1.0000 | ORAL_TABLET | Freq: Two times a day (BID) | ORAL | Status: DC
  Administered 2017-09-26: 1 via ORAL
  Filled 2017-09-26: qty 1

## 2017-09-26 NOTE — Procedures (Signed)
  Edward A. Merlene Laughter, MD     [email or web address]           HISTORY: This 83 year old man who presents with acute confusional episodes.  The study is being done to evaluate for seizure as the etiology.  MEDICATIONS: Scheduled Meds: Continuous Infusions: PRN Meds:.  Prior to Admission medications   Medication Sig Start Date End Date Taking? Authorizing Provider  albuterol (PROVENTIL) (2.5 MG/3ML) 0.083% nebulizer solution INHALE 1 VIAL VIA NEBULIZER EVERY 6 HOURS AS NEEDED FOR WHEEZING OR SHORTNESS OF BREATH 07/19/17   Orlena Sheldon, PA-C  apixaban (ELIQUIS) 2.5 MG TABS tablet Take 1 tablet (2.5 mg total) by mouth 2 (two) times daily. 08/30/17   Dena Billet B, PA-C  budesonide (PULMICORT) 0.5 MG/2ML nebulizer solution Take 2 mLs (0.5 mg total) by nebulization 2 (two) times daily. 07/19/17   Orlena Sheldon, PA-C  diltiazem (CARDIZEM CD) 240 MG 24 hr capsule Take 1 capsule (240 mg total) by mouth daily. 07/19/17   Orlena Sheldon, PA-C  furosemide (LASIX) 40 MG tablet Take 1 tablet (40 mg total) by mouth every other day. 09/26/17 09/26/18  Johnson, Clanford L, MD  insulin NPH Human (NOVOLIN N RELION) 100 UNIT/ML injection Inject 0.07 mLs (7 Units total) into the skin 2 (two) times daily before a meal. 09/26/17   Johnson, Clanford L, MD  ipratropium (ATROVENT) 0.02 % nebulizer solution Take 2.5 mLs (0.5 mg total) by nebulization 4 (four) times daily. 07/19/17   Orlena Sheldon, PA-C  losartan (COZAAR) 100 MG tablet Take 1 tablet (100 mg total) by mouth daily. 07/19/17   Dena Billet B, PA-C  metoprolol tartrate (LOPRESSOR) 25 MG tablet TAKE 1 TABLET BY MOUTH TWICE DAILY 09/03/17   Orlena Sheldon, PA-C  omeprazole (PRILOSEC) 20 MG capsule Take 1 capsule (20 mg total) by mouth daily. 03/23/16   Orlena Sheldon, PA-C  OXYGEN Inhale 2 L into the lungs daily.    [provider]  potassium chloride SA (K-DUR,KLOR-CON) 20 MEQ tablet Take 1 tablet (20 mEq total) by mouth every  other day. Take with lasix. 09/26/17   Johnson, Clanford L, MD  pravastatin (PRAVACHOL) 80 MG tablet Take 1 tablet (80 mg total) by mouth at bedtime. 07/19/17   Orlena Sheldon, PA-C      ANALYSIS: A 16 channel recording using standard 10 20 measurements is conducted for 20 minutes.   The background activity gets as high as 7-7.5 Hertz.  There is beta activity observed in the frontal areas.  Awake and drowsy activities are observed.  There is increased myogenic artifact seen throughout the recording especially on the right side.  Photic stimulation and hyperventilation were not carried out.  There is no focal or lateralized slowing.  There is no epileptiform activity is observed.   IMPRESSION: 1.   This recording shows mild global slowing indicating a mild global encephalopathy.  However, there is no evidence of epileptiform discharges.      Jennfer Gassen A. Merlene [name], M.D.  Diplomate, Tax adviser of Psychiatry and Neurology ( Neurology).

## 2017-09-26 NOTE — Care Management Note (Addendum)
Case Management Note  Patient Details  Name: Edward [name] MRN: [id number] Date of Birth: Apr 20, 1935  Subjective/Objective: Adm with encephalopathy, just discharged on 12/24 (here for Afib with RVR). Per chart, lives with wife. No one at bedside, called wife. Wife not at home, a friend answered the phone, states he lives with them. Left message for wife to call CM back. Discussed with attending, he has spoke with family at bedside, who states that they and patient adamantly decline any kind of facility placement.  Also with patient not have any ambulatory issues, not appropriate for SNF at this time. Patient does have Medicaid, so if patient declines and needed LTC, that may be an option if the future but still not appropriate at this time per CSW.  Home health discussed and will be arranged. Patient  [facility] per notes. Vaughan Basta of [facility] notified and will obtain from Epic when available. Per attending, patient will DC today.                  Action/Plan: DC home in care of family with home health.   ADDENDUM: Spoke with wife and son, they are in agreement with plan. Will pick patient up later today. Expected Discharge Date:  09/27/17               Expected Discharge Plan:  [place]  In-House Referral:     Discharge planning Services  CM Consult  Post Acute Care Choice:  Home Health Choice offered to:  Spouse  DME Arranged:    DME Agency:     HH Arranged:  RN, Social Work [employer] Agency:  [place]  Status of Service:  Completed, signed off  If discussed at [employer], dates discussed:    Additional Comments:  Kadesha Virrueta, Chauncey [place], RN 09/26/2017, 10:19 AM

## 2017-09-26 NOTE — Evaluation (Signed)
Physical Therapy Evaluation Patient Details Name: Edward [name] MRN: [id number] DOB: 1935-09-29 Today's Date: 09/26/2017   History of Present Illness  Edward [name] is a 82 y.o. male with PMH detailed below just discharged from hospital yesterday ended up going home and family reports that he became acutely confused and family found him walking outside at 5am. He did not know where he was going or what he was doing.  He was around 6 am packing his belongings. He had taken his oxygen off.   There were multiple medications lying around his room.  He had reported to his wife that he had given himself insulin.  He denies any pain.  He denies SOB or fever.  Wife did not notice any seizure activity.  In addition, the patient says that he has been doing fine.  He denies any of the things that his family is same.  He says that he is absolutely adamant that he will not go to rehab facility.  He says that he is content to go home.  He does not seem to understand that he cannot manage well at home.  He is unsure of what he actually has been taking at home.  There are medications spread all around his room.  He has no other complaints and says that he he would like to go home now.    Clinical Impression  Patient functioning at baseline for functional mobility and gait.  PLAN:  Patient to be discharged to home today and discharged from physical therapy to care of nursing.    Follow Up Recommendations No PT follow up    Equipment Recommendations  None recommended by PT    Recommendations for Other Services       Precautions / Restrictions Precautions Precautions: Fall Restrictions Weight Bearing Restrictions: No      Mobility  Bed Mobility Overal bed mobility: Independent                Transfers Overall transfer level: Independent                  Ambulation/Gait Ambulation/Gait assistance: Modified independent (Device/Increase time) Ambulation Distance (Feet): 150  Feet Assistive device: Rolling walker (2 wheeled) Gait Pattern/deviations: WFL(Within Functional Limits)   Gait velocity interpretation: at or above normal speed for age/gender General Gait Details: no loss of balance and on 1 LPM O2 during gait training  Stairs            Wheelchair Mobility    Modified Rankin (Stroke Patients Only)       Balance Overall balance assessment: No apparent balance deficits (not formally assessed)                                           Pertinent Vitals/Pain Pain Assessment: No/denies pain    Home Living Family/patient expects to be discharged to:: Private residence Living Arrangements: Spouse/significant other;Children Available Help at Discharge: Available 24 hours/day Type of Home: House Home Access: Level entry     Home Layout: One level Home Equipment: Environmental consultant - 2 wheels      Prior Function Level of Independence: Independent with assistive device(s)         Comments: independent with all ADLs, IADLs     Hand Dominance   Dominant Hand: Right    Extremity/Trunk Assessment   Upper Extremity Assessment Upper Extremity Assessment: Overall [facility]  for tasks assessed    Lower Extremity Assessment Lower Extremity Assessment: Overall WFL for tasks assessed    Cervical / Trunk Assessment Cervical / Trunk Assessment: Normal  Communication   Communication: No difficulties  Cognition Arousal/Alertness: Awake/alert Behavior During Therapy: WFL for tasks assessed/performed Overall Cognitive Status: Within Functional Limits for tasks assessed                                        General Comments      Exercises     Assessment/Plan    PT Assessment Patent does not need any further PT services  PT Problem List         PT Treatment Interventions      PT Goals (Current goals can be found in the Care Plan section)  Acute Rehab PT Goals Patient Stated Goal: return home PT Goal  Formulation: With patient Time For Goal Achievement: 09/26/17 Potential to Achieve Goals: Good    Frequency     Barriers to discharge        Co-evaluation               AM-PAC PT "6 Clicks" Daily Activity  Outcome Measure Difficulty turning over in bed (including adjusting bedclothes, sheets and blankets)?: None Difficulty moving from lying on back to sitting on the side of the bed? : None Difficulty sitting down on and standing up from a chair with arms (e.g., wheelchair, bedside commode, etc,.)?: None Help needed moving to and from a bed to chair (including a wheelchair)?: None Help needed walking in hospital room?: None Help needed climbing 3-5 steps with a railing? : None 6 Click Score: 24    End of Session Equipment Utilized During Treatment: Gait belt Activity Tolerance: Patient tolerated treatment well Patient left: in bed;with call bell/phone within reach;with bed alarm set   PT Visit Diagnosis: Unsteadiness on feet (R26.81);Other abnormalities of gait and mobility (R26.89);Muscle weakness (generalized) (M62.81)    Time: 9563-8756 PT Time Calculation (min) (ACUTE ONLY): 28 min   Charges:   PT Evaluation $PT Eval Low Complexity: 1 Low PT Treatments $Therapeutic Activity: 23-37 mins   PT G Codes:   PT G-Codes **NOT FOR INPATIENT CLASS** Functional Assessment Tool Used: AM-PAC 6 Clicks Basic Mobility Functional Limitation: Mobility: Walking and moving around Mobility: Walking and Moving Around Current Status (E3329): 0 percent impaired, limited or restricted Mobility: Walking and Moving Around Goal Status (J1884): 0 percent impaired, limited or restricted Mobility: Walking and Moving Around Discharge Status (Z6606): 0 percent impaired, limited or restricted    12:04 PM, 09/26/17 Lonell Grandchild, MPT Physical Therapist with [facility] [phone] office [phone] mobile phone

## 2017-09-26 NOTE — Progress Notes (Signed)
EEG Completed; Results Pending  

## 2017-09-26 NOTE — Progress Notes (Signed)
Patient off floor for neuro, and vital sign checks.

## 2017-09-26 NOTE — Progress Notes (Signed)
IV discontinued,catheter intact. Discharged home with instructions given on medications,and follow up visits,patient verbalized understanding. Accompanied by staff to an awaiting vehicle. 

## 2017-09-26 NOTE — Discharge Summary (Signed)
Physician Discharge Summary  RAMELL WACHA POE:[id number] DOB: 1934/12/28 DOA: 09/25/2017  PCP: Orlena Sheldon, PA-C  Admit date: 09/25/2017 Discharge date: 09/26/2017  Admitted From: Home  Disposition: Home with [facility] services / Refuses any type of placement  Recommendations for Outpatient Follow-up:  1. Follow up with PCP tomorrow 09/27/17 as scheduled 2. Follow up with neurology for outpatient neurocognitive testing 3. Please obtain BMP/CBC in 2 weeks 4. Please follow up on the following pending results: Final EEG report  Home Health: RN, Aide, SW  PT IS HIGH RISK FOR READMISSION DUE TO LONG HISTORY OF [place] TO BE PLACED IN A FACILITY  Discharge Condition:  STABLE  CODE STATUS: DNR    Brief Hospitalization Summary: Please see all hospital notes, images, labs for full details of the hospitalization. HPI: Edward [name] is a 82 y.o. male with PMH detailed below just discharged from hospital yesterday ended up going home and family reports that he became acutely confused and family found him walking outside at 5am. He did not know where he was going or what he was doing.  He was around 6 am packing his belongings. He had taken his oxygen off.   There were multiple medications lying around his room.  He had reported to his wife that he had given himself insulin.  He denies any pain.  He denies SOB or fever.  Wife did not notice any seizure activity.  In addition, the patient says that he has been doing fine.  He denies any of the things that his family is same.  He says that he is absolutely adamant that he will not go to rehab facility.  He says that he is content to go home.  He does not seem to understand that he cannot manage well at home.  He is unsure of what he actually has been taking at home.  There are medications spread all around his room.  He has no other complaints and says that he he would like to go home now.  1. Acute mental status changes/acute  encephalopathy-MRI/MRA completed and ruled out acute CVA.  He was monitored on tele and remained stable.  I believe he may have been having side effects from his medications.  He was taken off baclofen and remeron.   Neurochecks negative.  EEG ordered and pending.  ammonia level, B12 level and TSH was normal.  Pt has returned to his baseline, I confirmed at bedside with his wife and son.  Pt and family refuse any type of facility placement but will accept home health.  Will discharge home with [facility] RN, Aide and SW.  2. Diabetes mellitus, type II-poorly controlled, insulin requiring-patient unfortunately is not taking his insulin as prescribed.  He should be taking 7 units of NPH twice daily and monitoring his blood sugars 4 times per day.  I stressed the importance of this to him and his family.  They say they will care for him 24/7.   3. Chronic diastolic CHF-recent acute exacerbation treated.  He has been resumed on Lasix 40 mg every other day.    His weight is essentially unchanged from recent discharge. 4. CKD stage III- creatinine is stable from prior testing. 5. COPD-stable, resume home respiratory medications.  He is on continuous oxygen which will be continued.  6. Noncompliance- well-documented and much evidence of this is a big problem for this patient.  I feel that he needs skilled nursing care at this point however he  continues to decline.  His family also declines.  They say that they will care for him 24/7.   He will need 24/7 care and should not be allowed to manage his own medications.  They verbalized understanding.   DVT Prophylaxis: apixaban Code Status: DNR  Family Communication: bedside  Disposition Plan: Home with home health services.  Discharge Diagnoses:  Principal Problem:   Acute encephalopathy Active Problems:   Diabetes mellitus type 2, uncontrolled ([name])   COPD (chronic obstructive pulmonary disease) ([name])   Hypercholesterolemia   Non compliance w medication regimen    Chronic diastolic CHF (congestive heart failure) ([name])   Chronic respiratory failure ([name])   AF (paroxysmal atrial fibrillation) ([name])   CKD (chronic kidney disease), stage III ([name])   Bilateral lower extremity edema   Acute confusion   On home O2  Discharge Instructions: Discharge Instructions    Call MD for:  difficulty breathing, headache or visual disturbances   Complete by:  As directed    Call MD for:  extreme fatigue   Complete by:  As directed    Call MD for:  persistant dizziness or light-headedness   Complete by:  As directed    Call MD for:  persistant nausea and vomiting   Complete by:  As directed    Increase activity slowly   Complete by:  As directed      Allergies as of 09/26/2017      Reactions   Ace Inhibitors Other (See Comments)   Hyperkalemia--07/23/2013:patient states not familiar with the following allergy      Medication List    STOP taking these medications   baclofen 10 MG tablet Commonly known as:  LIORESAL   mirtazapine 30 MG tablet Commonly known as:  REMERON     TAKE these medications   albuterol (2.5 MG/3ML) 0.083% nebulizer solution Commonly known as:  PROVENTIL INHALE 1 VIAL VIA NEBULIZER EVERY 6 HOURS AS NEEDED FOR WHEEZING OR SHORTNESS OF BREATH   apixaban 2.5 MG Tabs tablet Commonly known as:  ELIQUIS Take 1 tablet (2.5 mg total) by mouth 2 (two) times daily.   budesonide 0.5 MG/2ML nebulizer solution Commonly known as:  PULMICORT Take 2 mLs (0.5 mg total) by nebulization 2 (two) times daily.   diltiazem 240 MG 24 hr capsule Commonly known as:  CARDIZEM CD Take 1 capsule (240 mg total) by mouth daily.   furosemide 40 MG tablet Commonly known as:  LASIX Take 1 tablet (40 mg total) by mouth every other day.   insulin NPH Human 100 UNIT/ML injection Commonly known as:  NOVOLIN N RELION Inject 0.07 mLs (7 Units total) into the skin 2 (two) times daily before a meal. What changed:  how much to take   ipratropium 0.02 %  nebulizer solution Commonly known as:  ATROVENT Take 2.5 mLs (0.5 mg total) by nebulization 4 (four) times daily.   losartan 100 MG tablet Commonly known as:  COZAAR Take 1 tablet (100 mg total) by mouth daily.   metoprolol tartrate 25 MG tablet Commonly known as:  LOPRESSOR TAKE 1 TABLET BY MOUTH TWICE DAILY   omeprazole 20 MG capsule Commonly known as:  PRILOSEC Take 1 capsule (20 mg total) by mouth daily.   OXYGEN Inhale 2 L into the lungs daily.   potassium chloride SA 20 MEQ tablet Commonly known as:  K-DUR,KLOR-CON Take 1 tablet (20 mEq total) by mouth every other day. Take with lasix.   pravastatin 80 MG tablet Commonly known as:  PRAVACHOL Take 1 tablet (80 mg total) by mouth at bedtime.      Follow-up Information    Phillips Odor, MD. Schedule an appointment as soon as possible for a visit in 2 week(s).   Specialty:  Neurology Why:  evaluate for dementia Contact information: [address] [place] [address] [phone]        Orlena Sheldon, PA-C. Go on 09/27/2017.   Specialty:  Physician Assistant Why:  Hospital Follow Up as already scheduled Contact information: [place] [address] [phone]        [facility] Follow up.   Contact information: [place] [address] [phone]         Allergies  Allergen Reactions  . Ace Inhibitors Other (See Comments)    Hyperkalemia--07/23/2013:patient states not familiar with the following allergy   Allergies as of 09/26/2017      Reactions   Ace Inhibitors Other (See Comments)   Hyperkalemia--07/23/2013:patient states not familiar with the following allergy      Medication List    STOP taking these medications   baclofen 10 MG tablet Commonly known as:  LIORESAL   mirtazapine 30 MG tablet Commonly known as:  REMERON     TAKE these medications   albuterol (2.5 MG/3ML) 0.083% nebulizer solution Commonly known as:   PROVENTIL INHALE 1 VIAL VIA NEBULIZER EVERY 6 HOURS AS NEEDED FOR WHEEZING OR SHORTNESS OF BREATH   apixaban 2.5 MG Tabs tablet Commonly known as:  ELIQUIS Take 1 tablet (2.5 mg total) by mouth 2 (two) times daily.   budesonide 0.5 MG/2ML nebulizer solution Commonly known as:  PULMICORT Take 2 mLs (0.5 mg total) by nebulization 2 (two) times daily.   diltiazem 240 MG 24 hr capsule Commonly known as:  CARDIZEM CD Take 1 capsule (240 mg total) by mouth daily.   furosemide 40 MG tablet Commonly known as:  LASIX Take 1 tablet (40 mg total) by mouth every other day.   insulin NPH Human 100 UNIT/ML injection Commonly known as:  NOVOLIN N RELION Inject 0.07 mLs (7 Units total) into the skin 2 (two) times daily before a meal. What changed:  how much to take   ipratropium 0.02 % nebulizer solution Commonly known as:  ATROVENT Take 2.5 mLs (0.5 mg total) by nebulization 4 (four) times daily.   losartan 100 MG tablet Commonly known as:  COZAAR Take 1 tablet (100 mg total) by mouth daily.   metoprolol tartrate 25 MG tablet Commonly known as:  LOPRESSOR TAKE 1 TABLET BY MOUTH TWICE DAILY   omeprazole 20 MG capsule Commonly known as:  PRILOSEC Take 1 capsule (20 mg total) by mouth daily.   OXYGEN Inhale 2 L into the lungs daily.   potassium chloride SA 20 MEQ tablet Commonly known as:  K-DUR,KLOR-CON Take 1 tablet (20 mEq total) by mouth every other day. Take with lasix.   pravastatin 80 MG tablet Commonly known as:  PRAVACHOL Take 1 tablet (80 mg total) by mouth at bedtime.       Procedures/Studies: Dg Chest 2 View  Result Date: 09/25/2017 CLINICAL DATA:  Altered mental status EXAM: CHEST  2 VIEW COMPARISON:  09/22/2017 FINDINGS: Heart and mediastinal contours are within normal limits. No focal opacities or effusions. No acute bony abnormality. IMPRESSION: No active cardiopulmonary disease. Electronically Signed   By: Rolm Baptise M.D.   On: 09/25/2017 11:17   Ct Head  Wo Contrast  Result Date: 09/25/2017 CLINICAL DATA:  Altered mental status EXAM: CT HEAD WITHOUT CONTRAST TECHNIQUE: Contiguous axial images were obtained from the base of the skull through the vertex without intravenous contrast. COMPARISON:  None. FINDINGS: Brain: Age related volume loss. Mild chronic small vessel disease. No acute intracranial abnormality. Specifically, no hemorrhage, hydrocephalus, mass lesion, acute infarction, or significant intracranial injury. Vascular: No hyperdense vessel or unexpected calcification. Skull: No acute calvarial abnormality. Sinuses/Orbits: Visualized paranasal sinuses and mastoids clear. Orbital soft tissues unremarkable. Other: None IMPRESSION: No acute intracranial abnormality. Atrophy, chronic microvascular disease. Electronically Signed   By: Rolm Baptise M.D.   On: 09/25/2017 11:37   Mr Brain Wo Contrast  Result Date: 09/26/2017 CLINICAL DATA:  Posttraumatic altered mental status and weakness. EXAM: MRI HEAD WITHOUT CONTRAST MRA HEAD WITHOUT CONTRAST TECHNIQUE: Multiplanar, multiecho pulse sequences of the brain and surrounding structures were obtained without intravenous contrast. Angiographic images of the head were obtained using MRA technique without contrast. COMPARISON:  Head CT from yesterday FINDINGS: MRI HEAD FINDINGS Brain: No acute infarction, hemorrhage, hydrocephalus, extra-axial collection or mass lesion. Generalized atrophy with ventriculomegaly, mild for age. Mild for age chronic microvascular ischemic type change in the cerebral white matter. Vascular: Arterial findings below. Normal dural venous sinus flow voids. Skull and upper cervical spine: Negative for marrow lesion Sinuses/Orbits: Bilateral cataract resection.  No acute finding. Other: Moderately motion degraded. MRA HEAD FINDINGS Motion degraded. Major vessels are diffusely patent. There is probable medium vessel atherosclerotic luminal irregularity, accentuated by motion artifact.  Atheromatous narrowing is most convincing in the bilateral P2 segments. No noted aneurysm. IMPRESSION: 1. Moderately degraded by motion. 2. Senescent changes without acute finding. 3. Intracranial atherosclerosis without proximal/reversible flow limiting stenosis. Electronically Signed   By: Monte Fantasia M.D.   On: 09/26/2017 08:13   US Carotid Bilateral (at [facility] Only)  Result Date: 09/26/2017 CLINICAL DATA:  CVA. History of hypertension, hyperlipidemia and diabetes. Former smoker. EXAM: BILATERAL CAROTID DUPLEX ULTRASOUND TECHNIQUE: Pearline Cables scale imaging, color Doppler and duplex ultrasound were performed of bilateral carotid and vertebral arteries in the neck. COMPARISON:  None. FINDINGS: Criteria: Quantification of carotid stenosis is based on velocity parameters that correlate the residual internal carotid diameter with NASCET-based stenosis levels, using the diameter of the distal internal carotid lumen as the denominator for stenosis measurement. The following velocity measurements were obtained: RIGHT ICA:  89/19 cm/sec CCA:  16/1 cm/sec SYSTOLIC ICA/CCA RATIO:  1.1 DIASTOLIC ICA/CCA RATIO:  2.4 ECA:  148 cm/sec LEFT ICA:  101/19 cm/sec CCA:  096/04 cm/sec SYSTOLIC ICA/CCA RATIO:  0.9 DIASTOLIC ICA/CCA RATIO:  1.4 ECA:  108 cm/sec RIGHT CAROTID ARTERY: There is a minimal amount of atherosclerotic plaque within the distal aspect of the right common carotid artery (image 11). There is a minimal amount of atherosclerotic plaque within the right carotid bulb (image 15), not resulting in elevated peak systolic velocities within the interrogated course the right internal carotid artery to suggest a hemodynamically significant stenosis. RIGHT VERTEBRAL ARTERY:  Antegrade flow LEFT CAROTID ARTERY: There is a moderate amount of atherosclerotic plaque within the distal aspect the left common carotid artery (images 45 and 46), extending to involve the left carotid bulb (images 49 and 51), not resulting in  elevated peak systolic velocities within the interrogated course the left internal carotid artery to suggest a hemodynamically significant stenosis. LEFT VERTEBRAL ARTERY:  Antegrade flow IMPRESSION: Minimal to moderate amount of bilateral atherosclerotic plaque, left greater than right, not resulting in a hemodynamically significant  stenosis within either internal carotid artery. Electronically Signed   By: Sandi Mariscal M.D.   On: 09/26/2017 08:45   Dg Chest Port 1 View  Result Date: 09/22/2017 CLINICAL DATA:  Bilateral lower extremity swelling and redness as well as abdominal swelling. EXAM: PORTABLE CHEST 1 VIEW COMPARISON:  03/04/2017 and 07/24/2016 FINDINGS: Lungs are adequately inflated with prominence of the perihilar markings with slightly more confluent opacification in the right infrahilar region. No evidence of effusion. Cardiomediastinal silhouette is within normal. There is minimal calcified plaque over the aortic arch. Remainder of the exam is unchanged. IMPRESSION: Findings suggesting mild vascular congestion. Slightly more confluent opacification in the right infrahilar region as early infection is possible. Electronically Signed   By: Marin Olp M.D.   On: 09/22/2017 16:45   Dg Abd Portable 1v  Result Date: 09/23/2017 CLINICAL DATA:  Abdominal swelling. EXAM: PORTABLE ABDOMEN - 1 VIEW COMPARISON:  Single-view of the abdomen 02/25/2017. FINDINGS: The bowel gas pattern is normal. No radio-opaque calculi or other significant radiographic abnormality are seen. IMPRESSION: Negative exam. Electronically Signed   By: Inge Rise M.D.   On: 09/23/2017 12:40   Mr Jodene Nam Head/brain NT Cm  Result Date: 09/26/2017 CLINICAL DATA:  Posttraumatic altered mental status and weakness. EXAM: MRI HEAD WITHOUT CONTRAST MRA HEAD WITHOUT CONTRAST TECHNIQUE: Multiplanar, multiecho pulse sequences of the brain and surrounding structures were obtained without intravenous contrast. Angiographic images of  the head were obtained using MRA technique without contrast. COMPARISON:  Head CT from yesterday FINDINGS: MRI HEAD FINDINGS Brain: No acute infarction, hemorrhage, hydrocephalus, extra-axial collection or mass lesion. Generalized atrophy with ventriculomegaly, mild for age. Mild for age chronic microvascular ischemic type change in the cerebral white matter. Vascular: Arterial findings below. Normal dural venous sinus flow voids. Skull and upper cervical spine: Negative for marrow lesion Sinuses/Orbits: Bilateral cataract resection.  No acute finding. Other: Moderately motion degraded. MRA HEAD FINDINGS Motion degraded. Major vessels are diffusely patent. There is probable medium vessel atherosclerotic luminal irregularity, accentuated by motion artifact. Atheromatous narrowing is most convincing in the bilateral P2 segments. No noted aneurysm. IMPRESSION: 1. Moderately degraded by motion. 2. Senescent changes without acute finding. 3. Intracranial atherosclerosis without proximal/reversible flow limiting stenosis. Electronically Signed   By: Monte Fantasia M.D.   On: 09/26/2017 08:13      Subjective: Pt without complaints.  He wants to go home and is upset at being here.  His family wants to take him home.    Discharge Exam: Vitals:   09/26/17 0622 09/26/17 0831  BP: (!) 158/66 (!) 157/70  Pulse: 87 90  Resp: 18 18  Temp:  98.6 F (37 C)  SpO2: 96% 97%   Vitals:   09/26/17 0222 09/26/17 0422 09/26/17 0622 09/26/17 0831  BP: 135/61 (!) 145/76 (!) 158/66 (!) 157/70  Pulse: 83 81 87 90  Resp: 17 20 18 18   Temp:    98.6 F (37 C)  TempSrc:    Oral  SpO2: 99% 99% 96% 97%  Weight:      Height:       General: Pt is alert, awake, not in acute distress Cardiovascular: normal S1/S2 +, no rubs, no gallops Respiratory: CTA bilaterally, no wheezing, no rhonchi Abdominal: Soft, NT, ND, bowel sounds + Extremities: no edema, no cyanosis   The results of significant diagnostics from this  hospitalization (including imaging, microbiology, ancillary and laboratory) are listed below for reference.     Microbiology: Recent Results (from the past 240 hour(s))  MRSA PCR Screening     Status: None   Collection Time: 09/22/17  8:29 PM  Result Value Ref Range Status   MRSA by PCR NEGATIVE NEGATIVE Final    Comment:        The GeneXpert MRSA Assay (FDA approved for NASAL specimens only), is one component of a comprehensive MRSA colonization surveillance program. It is not intended to diagnose MRSA infection nor to guide or monitor treatment for MRSA infections.   Culture, blood (Routine X 2) w Reflex to ID Panel     Status: None (Preliminary result)   Collection Time: 09/25/17  3:11 PM  Result Value Ref Range Status   Specimen Description   Final    RIGHT ANTECUBITAL BOTTLES DRAWN AEROBIC AND ANAEROBIC   Special Requests   Final    Blood Culture results may not be optimal due to an excessive volume of blood received in culture bottles   Culture NO GROWTH < 24 HOURS  Final   Report Status PENDING  Incomplete  Culture, blood (Routine X 2) w Reflex to ID Panel     Status: None (Preliminary result)   Collection Time: 09/25/17  3:19 PM  Result Value Ref Range Status   Specimen Description   Final    BLOOD RIGHT HAND BOTTLES DRAWN AEROBIC AND ANAEROBIC   Special Requests   Final    Blood Culture results may not be optimal due to an excessive volume of blood received in culture bottles   Culture NO GROWTH < 24 HOURS  Final   Report Status PENDING  Incomplete     Labs: BNP (last 3 results) Recent Labs    03/21/17 1513 09/22/17 1608 09/25/17 1106  BNP 121.5* 224.0* 315.1*   Basic Metabolic Panel: Recent Labs  Lab 09/22/17 1628 09/23/17 0420 09/24/17 0505 09/25/17 1104 09/26/17 0655  NA 132* 139 142 141 140  K 5.0 3.7 4.2 4.9 3.8  CL 97* 102 101 101 102  CO2  --  29 29 30 28   GLUCOSE 203* 52* 70 417* 116*  BUN 25* 20 22* 29* 19  CREATININE 1.00 1.09 1.29*  1.62* 1.15  CALCIUM  --  8.7* 9.0 8.8* 8.4*   Liver Function Tests: Recent Labs  Lab 09/25/17 1104 09/26/17 0655  AST 18 22  ALT 17 15*  ALKPHOS 107 94  BILITOT 0.5 0.5  PROT 7.0 6.3*  ALBUMIN 3.4* 3.2*   No results for input(s): LIPASE, AMYLASE in the last 168 hours. Recent Labs  Lab 09/25/17 1701 09/26/17 0655  AMMONIA 17 16   CBC: Recent Labs  Lab 09/22/17 1608 09/22/17 1628 09/25/17 1104  WBC 10.4  --  7.4  NEUTROABS 6.4  --  5.4  HGB 9.4* 9.9* 9.3*  HCT 30.8* 29.0* 31.5*  MCV 77.0*  --  78.9  PLT 456*  --  515*   Cardiac Enzymes: Recent Labs  Lab 09/25/17 1104  TROPONINI 0.03*   BNP: Invalid input(s): POCBNP CBG: Recent Labs  Lab 09/24/17 0742 09/24/17 0810 09/25/17 1122 09/25/17 1742 09/25/17 2057  GLUCAP 46* 102* 393* 321* 205*   D-Dimer No results for input(s): DDIMER in the last 72 hours. Hgb A1c No results for input(s): HGBA1C in the last 72 hours. Lipid Profile Recent Labs    09/26/17 0655  CHOL 98  HDL 30*  LDLCALC 43  TRIG 126  CHOLHDL 3.3   Thyroid function studies Recent Labs    09/25/17 1512  TSH 3.123   Anemia work up Recent  Labs    09/25/17 1512  VITAMINB12 276  FOLATE 18.0   Urinalysis    Component Value Date/Time   COLORURINE YELLOW 09/25/2017 1038   APPEARANCEUR CLEAR 09/25/2017 1038   LABSPEC 1.014 09/25/2017 1038   PHURINE 5.0 09/25/2017 1038   GLUCOSEU >=500 (A) 09/25/2017 1038   HGBUR NEGATIVE 09/25/2017 1038   BILIRUBINUR NEGATIVE 09/25/2017 1038   KETONESUR 5 (A) 09/25/2017 1038   PROTEINUR 100 (A) 09/25/2017 1038   NITRITE NEGATIVE 09/25/2017 1038   LEUKOCYTESUR NEGATIVE 09/25/2017 1038   Sepsis Labs Invalid input(s): PROCALCITONIN,  WBC,  LACTICIDVEN Microbiology Recent Results (from the past 240 hour(s))  MRSA PCR Screening     Status: None   Collection Time: 09/22/17  8:29 PM  Result Value Ref Range Status   MRSA by PCR NEGATIVE NEGATIVE Final    Comment:        The GeneXpert MRSA  Assay (FDA approved for NASAL specimens only), is one component of a comprehensive MRSA colonization surveillance program. It is not intended to diagnose MRSA infection nor to guide or monitor treatment for MRSA infections.   Culture, blood (Routine X 2) w Reflex to ID Panel     Status: None (Preliminary result)   Collection Time: 09/25/17  3:11 PM  Result Value Ref Range Status   Specimen Description   Final    RIGHT ANTECUBITAL BOTTLES DRAWN AEROBIC AND ANAEROBIC   Special Requests   Final    Blood Culture results may not be optimal due to an excessive volume of blood received in culture bottles   Culture NO GROWTH < 24 HOURS  Final   Report Status PENDING  Incomplete  Culture, blood (Routine X 2) w Reflex to ID Panel     Status: None (Preliminary result)   Collection Time: 09/25/17  3:19 PM  Result Value Ref Range Status   Specimen Description   Final    BLOOD RIGHT HAND BOTTLES DRAWN AEROBIC AND ANAEROBIC   Special Requests   Final    Blood Culture results may not be optimal due to an excessive volume of blood received in culture bottles   Culture NO GROWTH < 24 HOURS  Final   Report Status PENDING  Incomplete   Time coordinating discharge:   SIGNED:  Irwin Brakeman, MD  [facility] 09/26/2017, 10:42 AM Pager [phone]  If 7PM-7AM, please contact night-coverage [email or web address] Password [email or web address]

## 2017-09-26 NOTE — Progress Notes (Signed)
OT Cancellation Note  Patient Details Name: Edward [name] MRN: [id number] DOB: 03/12/1935   Cancelled Treatment:    Reason Eval/Treat Not Completed: Patient at procedure or test/ unavailable. Pt off floor for testing. Will attempt OT evaluation at a later time.    Guadelupe Sabin, OTR/L  [phone] 09/26/2017, 8:25 AM

## 2017-09-26 NOTE — Discharge Instructions (Signed)
Follow with Primary MD  Orlena Sheldon, PA-C  and other consultant's as instructed your Hospitalist MD  Please get a complete blood count and chemistry panel checked by your Primary MD at your next visit, and again as instructed by your Primary MD.  Get Medicines reviewed and adjusted: Please take all your medications with you for your next visit with your Primary MD  Laboratory/radiological data: Please request your Primary MD to go over all hospital tests and procedure/radiological results at the follow up, please ask your Primary MD to get all Hospital records sent to his/her office.  In some cases, they will be blood work, cultures and biopsy results pending at the time of your discharge. Please request that your primary care M.D. follows up on these results.  Also Note the following: If you experience worsening of your admission symptoms, develop shortness of breath, life threatening emergency, suicidal or homicidal thoughts you must seek medical attention immediately by calling 911 or calling your MD immediately  if symptoms less severe.  You must read complete instructions/literature along with all the possible adverse reactions/side effects for all the Medicines you take and that have been prescribed to you. Take any new Medicines after you have completely understood and accpet all the possible adverse reactions/side effects.   Do not drive when taking Pain medications or sleeping medications (Benzodaizepines)  Do not take more than prescribed Pain, Sleep and Anxiety Medications. It is not advisable to combine anxiety,sleep and pain medications without talking with your primary care practitioner  Special Instructions: If you have smoked or chewed Tobacco  in the last 2 yrs please stop smoking, stop any regular Alcohol  and or any Recreational drug use.  Wear Seat belts while driving.  Please note: You were cared for by a hospitalist during your hospital stay. Once you are discharged,  your primary care physician will handle any further medical issues. Please note that NO REFILLS for any discharge medications will be authorized once you are discharged, as it is imperative that you return to your primary care physician (or establish a relationship with a primary care physician if you do not have one) for your post hospital discharge needs so that they can reassess your need for medications and monitor your lab values.

## 2017-09-27 ENCOUNTER — Encounter: Payer: Self-pay | Admitting: Physician Assistant

## 2017-09-27 ENCOUNTER — Ambulatory Visit (INDEPENDENT_AMBULATORY_CARE_PROVIDER_SITE_OTHER): Payer: Medicare Other | Admitting: Physician Assistant

## 2017-09-27 VITALS — BP 160/90 | HR 80 | Temp 97.6°F | Resp 14 | Wt 197.6 lb

## 2017-09-27 DIAGNOSIS — E1165 Type 2 diabetes mellitus with hyperglycemia: Secondary | ICD-10-CM | POA: Diagnosis not present

## 2017-09-27 DIAGNOSIS — J8410 Pulmonary fibrosis, unspecified: Secondary | ICD-10-CM

## 2017-09-27 DIAGNOSIS — Z794 Long term (current) use of insulin: Secondary | ICD-10-CM

## 2017-09-27 DIAGNOSIS — I5032 Chronic diastolic (congestive) heart failure: Secondary | ICD-10-CM | POA: Diagnosis not present

## 2017-09-27 DIAGNOSIS — Z9114 Patient's other noncompliance with medication regimen: Secondary | ICD-10-CM

## 2017-09-27 DIAGNOSIS — N183 Chronic kidney disease, stage 3 unspecified: Secondary | ICD-10-CM

## 2017-09-27 DIAGNOSIS — R600 Localized edema: Secondary | ICD-10-CM | POA: Diagnosis not present

## 2017-09-27 DIAGNOSIS — I10 Essential (primary) hypertension: Secondary | ICD-10-CM

## 2017-09-27 DIAGNOSIS — J439 Emphysema, unspecified: Secondary | ICD-10-CM | POA: Diagnosis not present

## 2017-09-27 LAB — URINE CULTURE: Culture: <10000 — AB

## 2017-09-27 NOTE — Progress Notes (Signed)
Patient ID: Edward [name] MRN: [id number], DOB: 03-19-1935, 82 y.o. Date of Encounter: @DATE @  Chief Complaint:  Chief Complaint  Patient presents with  . left leg pain    HPI: 82 y.o. year old male  presents with above.   I reviewed his chart. I reviewed that he has had 2 recent hospitalizations.   One of these was 09/22/17 through 09/24/17. The second of these was 09/25/17 through 09/26/17.  At the hospitalization on 09/22/17 patient reported that his PCP had taken him off of Lasix and diabetic medication.  They reviewed that my notes only indicated that I had stopped the metformin.  Since that time, Mr. Fambro had not been taking his Lasix and had been having worsening breathing and worsening swelling of his legs.  At the hospitalization on 09/25/17 they noted his long history of poor compliance and he refused to be placed in any type of facility.  Today when I enter the room patient has his shoes and socks off and has his pants pulled up above his knees for me to see his lower legs.  Says that his leg is bothering him.  Asked if he is taking his Lasix and he does not know for sure.  His wife does accompany him for visit today so I looked at her and asked if he is taking his Lasix.  She states that she tries to give him his medicine but a lot of days he says that he does not want them right then so she does not administer them.  Then asked the patient if he then takes the medicines himself.  He replies "some of them ".  I asked him how he knows which ones does take or does not take and he does not know.  Does not know for sure whether he has been taking the Lasix or not.  I asked him if he would let someone help administer his medications but he declines.   Past Medical History:  Diagnosis Date  . Allergy    Rhinitis  . Bronchitis   . Chronic respiratory failure ([place])   . Colon polyps   . COPD (chronic obstructive pulmonary disease) ([facility])   . Diabetes mellitus   .  Elevated lipids   . Hypercholesterolemia   . Hypertension   . Noncompliance   . On home O2    2L N/C   . PSA elevation   . Pulmonary fibrosis ([place])   . Vitamin D deficiency      Home Meds: Outpatient Medications Prior to Visit  Medication Sig Dispense Refill  . albuterol (PROVENTIL) (2.5 MG/3ML) 0.083% nebulizer solution INHALE 1 VIAL VIA NEBULIZER EVERY 6 HOURS AS NEEDED FOR WHEEZING OR SHORTNESS OF BREATH 360 mL 5  . apixaban (ELIQUIS) 2.5 MG TABS tablet Take 1 tablet (2.5 mg total) by mouth 2 (two) times daily. 60 tablet 2  . budesonide (PULMICORT) 0.5 MG/2ML nebulizer solution Take 2 mLs (0.5 mg total) by nebulization 2 (two) times daily. 120 mL 5  . diltiazem (CARDIZEM CD) 240 MG 24 hr capsule Take 1 capsule (240 mg total) by mouth daily. 30 capsule 3  . furosemide (LASIX) 40 MG tablet Take 1 tablet (40 mg total) by mouth every other day.    . insulin NPH Human (NOVOLIN N RELION) 100 UNIT/ML injection Inject 0.07 mLs (7 Units total) into the skin 2 (two) times daily before a meal. 10 mL 2  . ipratropium (ATROVENT) 0.02 % nebulizer solution Take 2.5  mLs (0.5 mg total) by nebulization 4 (four) times daily. 25 mL 12  . losartan (COZAAR) 100 MG tablet Take 1 tablet (100 mg total) by mouth daily. 30 tablet 1  . metoprolol tartrate (LOPRESSOR) 25 MG tablet TAKE 1 TABLET BY MOUTH TWICE DAILY 60 tablet 0  . omeprazole (PRILOSEC) 20 MG capsule Take 1 capsule (20 mg total) by mouth daily. 30 capsule 3  . OXYGEN Inhale 2 L into the lungs daily.    . potassium chloride SA (K-DUR,KLOR-CON) 20 MEQ tablet Take 1 tablet (20 mEq total) by mouth every other day. Take with lasix.    Marland Kitchen pravastatin (PRAVACHOL) 80 MG tablet Take 1 tablet (80 mg total) by mouth at bedtime. 30 tablet 1   No facility-administered medications prior to visit.     Allergies:  Allergies  Allergen Reactions  . Ace Inhibitors Other (See Comments)    Hyperkalemia--07/23/2013:patient states not familiar with the following  allergy    Social History   Socioeconomic History  . Marital status: Married    Spouse name: Not on file  . Number of children: Not on file  . Years of education: Not on file  . Highest education level: Not on file  Social Needs  . Financial resource strain: Not on file  . Food insecurity - worry: Not on file  . Food insecurity - inability: Not on file  . Transportation needs - medical: Not on file  . Transportation needs - non-medical: Not on file  Occupational History  . 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 Smoking status: Former Smoker    Packs/day: 1.50    Years: 60.00    Pack years: 90.00    Types: Cigarettes    Last attempt to quit: 12/31/2012    Years since quitting: 4.7  . Smokeless tobacco: Never Used  Substance and Sexual Activity  . Alcohol use: No  . Drug use: No  . Sexual activity: Yes    Birth control/protection: None  Other Topics Concern  . Not on file  Social History Narrative  . Not on file    Family History  Problem Relation Age of Onset  . Heart disease Mother   . CAD Other   . Diabetes Other      Review of Systems:  See HPI for pertinent ROS. All other ROS negative.    Physical Exam: Blood pressure (!) 160/90, pulse 80, temperature 97.6 F (36.4 C), temperature source Oral, resp. rate 14, weight 89.6 kg (197 lb 9.6 oz), SpO2 95 %., Body mass index is 32.88 kg/m. General:WM.  Appears in no acute distress. Neck: Supple. No thyromegaly. No lymphadenopathy. Lungs: Clear bilaterally to auscultation without wheezes, rales, or rhonchi. Breathing is unlabored. Heart: RRR with S1 S2. No murmurs, rubs, or gallops. Abdomen: Soft, non-tender, non-distended with normoactive bowel sounds. No hepatomegaly. No rebound/guarding. No obvious abdominal masses. Musculoskeletal:  Strength and tone normal for age. Extremities/Skin: Pitting edema 2/3 up calves bilaterally. Neuro: Alert and oriented X 3. Moves all extremities  spontaneously. Gait is normal. CNII-XII grossly in tact. Psych:  Responds to questions appropriately with a normal affect.     ASSESSMENT AND PLAN:  82 y.o. year old male with  1. Non compliance w medication regimen I wrote down the word furosemide on paper and told patient and wife for him to go home and take this as directed on the medication bottle. Discussed taking all medications as directed but explained that the furosemide  is the one that he specifically needs for this swelling and the discomfort in his lower legs. 2. Essential hypertension  3. Chronic diastolic heart failure ([name])  4. Pulmonary emphysema, unspecified emphysema type ([place]  5. Pulmonary fibrosis ([place])  6. Uncontrolled type 2 diabetes mellitus with hyperglycemia, with long-term current use of insulin ([place])  7. CKD (chronic kidney disease), stage III ([place])  8. Bilateral lower extremity edema   Signed, Olean Ree [place], [place], [facility] 09/27/2017 3:03 PM

## 2017-09-30 LAB — CULTURE, BLOOD (ROUTINE X 2)
CULTURE: NO GROWTH
CULTURE: NO GROWTH

## 2017-10-01 ENCOUNTER — Telehealth: Payer: Self-pay

## 2017-10-01 ENCOUNTER — Telehealth: Payer: Self-pay | Admitting: *Deleted

## 2017-10-01 DIAGNOSIS — Z794 Long term (current) use of insulin: Secondary | ICD-10-CM | POA: Diagnosis not present

## 2017-10-01 DIAGNOSIS — I5032 Chronic diastolic (congestive) heart failure: Secondary | ICD-10-CM | POA: Diagnosis not present

## 2017-10-01 DIAGNOSIS — Z87891 Personal history of nicotine dependence: Secondary | ICD-10-CM | POA: Diagnosis not present

## 2017-10-01 DIAGNOSIS — N183 Chronic kidney disease, stage 3 (moderate): Secondary | ICD-10-CM | POA: Diagnosis not present

## 2017-10-01 DIAGNOSIS — J8410 Pulmonary fibrosis, unspecified: Secondary | ICD-10-CM | POA: Diagnosis not present

## 2017-10-01 DIAGNOSIS — E1122 Type 2 diabetes mellitus with diabetic chronic kidney disease: Secondary | ICD-10-CM | POA: Diagnosis not present

## 2017-10-01 DIAGNOSIS — I480 Paroxysmal atrial fibrillation: Secondary | ICD-10-CM | POA: Diagnosis not present

## 2017-10-01 DIAGNOSIS — Z9981 Dependence on supplemental oxygen: Secondary | ICD-10-CM | POA: Diagnosis not present

## 2017-10-01 DIAGNOSIS — J439 Emphysema, unspecified: Secondary | ICD-10-CM | POA: Diagnosis not present

## 2017-10-01 DIAGNOSIS — J9610 Chronic respiratory failure, unspecified whether with hypoxia or hypercapnia: Secondary | ICD-10-CM | POA: Diagnosis not present

## 2017-10-01 DIAGNOSIS — Z9181 History of falling: Secondary | ICD-10-CM | POA: Diagnosis not present

## 2017-10-01 DIAGNOSIS — I130 Hypertensive heart and chronic kidney disease with heart failure and stage 1 through stage 4 chronic kidney disease, or unspecified chronic kidney disease: Secondary | ICD-10-CM | POA: Diagnosis not present

## 2017-10-01 NOTE — Telephone Encounter (Signed)
Juliann Pulse with [facility] called to inform our office that patient only has potassium, lasix and metoprolol and insulin in the home. Patient told Juliann Pulse the pharmacy has not sent the other medications, Juliann Pulse is asking for orders to see patient 2 times a week for 2 weeks then 1 time a week for 4 weeks

## 2017-10-01 NOTE — Patient Outreach (Signed)
[facility]) Care Management  10/01/2017  Edward [name] 19-Mar-1935 [id number]   Late entry for 09/28/17 Transition of care/Red flag for EMMI - General Discharge   CM spoke with Edward Edward [name] after re admission via ED to hospital on 09/25/17 to 09/26/17 and CM notified on 09/28/17 by [facility] CMA about red flag for EMMI- general discharge for patient on 09/27/17  He has had admissions via ED visits x 2 in December 2018 - Admissions 09/22/17 to 09/24/17 (inpatient- dx A RVR) and 09/25/17 to 09/26/17 (observation for confusion dx encephalopathy after found walking outside at 5 am and packing his belongings at 6 am) He was evaluated for possible CVA and seizure activity in the hospital but seizures and CV were both ruled out.  When CM discussed his mentioned behavior before being returned to ED on 09/25/17 Edward [name] informs CM he "don't remember any of that"   He was discharged home with [facility] RN, aide and SW. Edward [name] confirmed Home health services has arrived in the home but he reports not wanting home health services in his home "long" He asked when home health services would be able to stop. Cm discussed that the [facility] is present to assist in teaching him and his family members how to care for him and will leave once goals are met   EMMI - General Discharge results for 09/27/17   Read discharge papers? No Know who to call about changes in condition? No  CM reviewed EMMI General Discharge results for 09/27/17  CM able to get Edward [name] to confirm that he can not read well and stopped school in the "seventh grade"  CM provided empathy but also discussed with Edward [name] the importance of communicating this important information with [facility] CM and ALL medical providers. " I don't share my business"   CM reviewed who to call about changes in his condition (Primary care provider, Dena Billet, 24 hour nurse line [phone]) and referred him to his [facility] calendar for  action plans  Edward [name] confirms he did go to see [name],Primary care provider, on 09/27/17. [facility] CM reviewed MD note in EPIC   Edward [name] has a hx of non compliance to medicine and plans of care with poor support systems (wife, sons & family) to encourage compliance. Primary care provider's note indicates he also adamantly refuses facility placement and having someone assist with administering his medications  Edward [name] encourages CM to update Edward [name] about his inability to read but he again states, " I don't share my business"   Edward [name] agrees to allow [facility] CM to visit him again after stating during the last home visit that he "don't care" if CM visit   Plans: Follow up with Edward [name] for transition of care services weekly and attempt another home visit with his agreeance Sent in basket message to [employer] (illiteracy, Further neurogenic testing/? Referral needed, family teaching) Routed note to care team members listed in [place] with [facility] staff members  [facility] CM Care Plan Problem One     Most Recent Value  Care Plan Problem One  transition care services  Role Documenting the Problem One  Care Management [name] for Problem One  Active  [facility] Long Term Goal   over the next 31 days, patient will work with [facility] CM to understand transition of care needs   [name] Long Term Goal Start Date  09/28/17  Interventions for  Problem One Long Term Goal  transition of care review, colaborate with home health service staff and MD, Educate on discharge diagnoses, medications, home health, DME,   [facility] CM Short Term Goal #1   over the next 14 days patient will work with home health care staff as scheduled  [facility] CM Short Term Goal #1 Start Date  09/28/17  Interventions for Short Term Goal #1  confirm home health services continue to arrive to patient's home and patient is engaging in care    [name] CM Care Plan Problem Two     Most Recent Value  Care Plan Problem Two   Knowledge deficit related to DM, CHF, COPD home maangement  Role Documenting the Problem Two  Care Management Coordinator  Care Plan for Problem Two  Active  Interventions for Problem Two Long Term Goal   educate patient and family caregivers on action plans for home care of CHF, COPD, DM, use teach back method for evaluation of understanding,  [facility] Long Term Goal  over the next 45 days patient will verbalize understanding of plan of care for DM, CHF, COPD using the teach back method  [facility] Long Term Goal Start Date  09/24/17  [name] CM Short Term Goal #1   over the next 14 days patient will allow CM and home health staff to complete home visit  to begin education  [name] CM Short Term Goal #1 Start Date  09/28/17  Interventions for Short Term Goal #2   complete transition of care calls and home visits, collaborate with home health staff       [place]. Edward Hamman, RN, BSN, [place] Care Management [phone]

## 2017-10-01 NOTE — Telephone Encounter (Signed)
Approved.  

## 2017-10-02 DIAGNOSIS — J449 Chronic obstructive pulmonary disease, unspecified: Secondary | ICD-10-CM | POA: Diagnosis not present

## 2017-10-03 ENCOUNTER — Telehealth: Payer: Self-pay

## 2017-10-03 NOTE — Telephone Encounter (Signed)
Fax came through from [facility] confirming metformin had been discontinued, Provider did d/c metformin

## 2017-10-04 ENCOUNTER — Other Ambulatory Visit: Payer: Self-pay | Admitting: *Deleted

## 2017-10-04 ENCOUNTER — Other Ambulatory Visit: Payer: Self-pay

## 2017-10-04 NOTE — Patient Outreach (Signed)
[facility]) Care Management   10/04/2017  Martavius TRASEAN DELIMA Dec 27, 1934 [id number]  [place] is an 83 y.o. male  with medical history significant ofchronic respiratory failure on home oxygen, COPD, pulmonary fibrosis, chronic diastolic congestive heart failure, diabetespneumonia, rapid atrial fibrillation and sepsis  Mr Shidler had been previously followed by [facility] CM services in 2017 and the last contact was made in January 2018. His [facility] consent form noted in EPIC. He was referred to this [facility] CM from [place], [place] for uncontrolled DM &knowledge deficit regarding   EPIC indicates 2 admissions in the last 6 months, both in December 2018 (Before that 2 admissions in June 2018).    Subjective:  See notes below Objective:   BP 130/70   Pulse 91   Temp 97.9 F (36.6 C) (Oral)   Resp 20   Wt 180 lb (81.6 kg)   SpO2 95%   BMI 29.95 kg/m   Review of Systems  Constitutional: Negative for chills, diaphoresis, fever, malaise/fatigue and weight loss.  HENT: Negative.  Negative for congestion, ear discharge, ear pain, hearing loss, nosebleeds, sinus pain, sore throat and tinnitus.   Eyes: Negative.  Negative for blurred vision, double vision, photophobia, pain, discharge and redness.  Respiratory: Negative.  Negative for cough, hemoptysis, sputum production, shortness of breath, wheezing and stridor.   Cardiovascular: Positive for leg swelling. Negative for chest pain, palpitations, orthopnea, claudication and PND.  Gastrointestinal: Negative.   Genitourinary: Negative.   Musculoskeletal: Positive for joint pain.  Skin: Negative.  Negative for itching and rash.  Neurological: Positive for sensory change and weakness. Negative for dizziness, tingling, tremors, speech change, focal weakness, seizures, loss of consciousness and headaches.  Endo/Heme/Allergies: Negative for environmental allergies and polydipsia. Bruises/bleeds easily.   Psychiatric/Behavioral: Negative.  Negative for depression, hallucinations, memory loss, substance abuse and suicidal ideas. The patient is not nervous/anxious and does not have insomnia.     Physical Exam  Constitutional: He is oriented to person, place, and time. He appears well-developed and well-nourished.  HENT:  Head: Normocephalic and atraumatic.  Eyes: Conjunctivae are normal. Pupils are equal, round, and reactive to light.  Neck: Normal range of motion. Neck supple.  Cardiovascular: Normal rate and regular rhythm.  Respiratory: Effort normal and breath sounds normal.  GI: Soft. Bowel sounds are normal.  Musculoskeletal: He exhibits edema.  Neurological: He is alert and oriented to person, place, and time.  Skin: Skin is warm and dry.  Psychiatric: He has a normal mood and affect. His behavior is normal.    Encounter Medications:   Outpatient Encounter Medications as of 10/04/2017  Medication Sig  . albuterol (PROVENTIL) (2.5 MG/3ML) 0.083% nebulizer solution INHALE 1 VIAL VIA NEBULIZER EVERY 6 HOURS AS NEEDED FOR WHEEZING OR SHORTNESS OF BREATH  . apixaban (ELIQUIS) 2.5 MG TABS tablet Take 1 tablet (2.5 mg total) by mouth 2 (two) times daily.  . budesonide (PULMICORT) 0.5 MG/2ML nebulizer solution Take 2 mLs (0.5 mg total) by nebulization 2 (two) times daily.  Marland Kitchen diltiazem (CARDIZEM CD) 240 MG 24 hr capsule Take 1 capsule (240 mg total) by mouth daily.  . furosemide (LASIX) 40 MG tablet Take 1 tablet (40 mg total) by mouth every other day.  . insulin NPH Human (NOVOLIN N RELION) 100 UNIT/ML injection Inject 0.07 mLs (7 Units total) into the skin 2 (two) times daily before a meal.  . ipratropium (ATROVENT) 0.02 % nebulizer solution Take 2.5 mLs (0.5 mg total) by nebulization 4 (  four) times daily.  Marland Kitchen losartan (COZAAR) 100 MG tablet Take 1 tablet (100 mg total) by mouth daily.  . metoprolol tartrate (LOPRESSOR) 25 MG tablet TAKE 1 TABLET BY MOUTH TWICE DAILY  . omeprazole  (PRILOSEC) 20 MG capsule Take 1 capsule (20 mg total) by mouth daily.  . OXYGEN Inhale 2 L into the lungs daily.  . potassium chloride SA (K-DUR,KLOR-CON) 20 MEQ tablet Take 1 tablet (20 mEq total) by mouth every other day. Take with lasix.  Marland Kitchen pravastatin (PRAVACHOL) 80 MG tablet Take 1 tablet (80 mg total) by mouth at bedtime.   No facility-administered encounter medications on file as of 10/04/2017.     Functional Status:   In your present state of health, do you have any difficulty performing the following activities: 09/25/2017 09/22/2017  Hearing? N N  Vision? N N  Difficulty concentrating or making decisions? N N  Walking or climbing stairs? N N  Dressing or bathing? N N  Doing errands, shopping? N N  Some recent data might be hidden    Fall/Depression Screening:    Fall Risk  09/12/2017 08/15/2017 07/20/2017  Falls in the past year? Yes No No  Number falls in past yr: 1 - -  Injury with Fall? No - -   PHQ 2/9 Scores 09/12/2017 08/15/2017 07/20/2017 06/20/2017 07/27/2016 03/16/2016 02/09/2015  PHQ - 2 Score 0 0 0 0 0 0 1  PHQ- 9 Score 0 0 - 0 - - -    Assessment:    CM called to get an update from Mr Macho and was informed he had not followed up as CM encouraged him During the home visit Mr Limas shared [employer] he was having trouble with his "circuit going off" and this was partially why he discussed he was not wearing his sleep apnea machine. Encouraged to have an electrician to look at his home electrical system.   [place] is his preferred DME provider  Diabetes--cbgs written down on his sheet included  319 and 161 only.  He is not being compliant with recording cbg readings Mr Tr[place] was able to demonstrate to the cm how to check cbgs with his glucometer.   Edema of legs-calf measurements =13 1/2 in width, 17 in length, 10 inches circumference of his ankle. Discussed use of compression hose possible from [place] "They swell all the time over  and over"   [facility] Over the counter book reported not to have arrived but CM questioned if he had received it and did not know what it was   CHF Mr Rawl confirmed he had not been completing daily weights  CM had him weigh himself after showing him again how to zero his scale obtained in 2018 He was resistant to completing the weight but completed task after encouragement   CM discussed that he should consider in 2019 going to a cardiologist but he states he prefers only to see Karis Juba "It costs money."   Plan: CM to follow up with Mr Sciandra in 4-6 weeks Route note to [place] Problem One     Most Recent Value  Care Plan Problem One  transition care services  Role Documenting the Problem One  Care Management [place] for Problem One  Active  [facility] Long Term Goal   over the next 31 days, patient will work with [facility] CM to understand transition of care needs   [facility] Long Term Goal Start Date  09/28/17  Interventions for Problem One Long Term Goal  transition of care review, colaborate with home health service staff and MD, Educate on discharge diagnoses, medications, home health, DME,   [facility] CM Short Term Goal #1   over the next 14 days patient will work with home health care staff as scheduled  [facility] CM Short Term Goal #1 Start Date  09/28/17  Interventions for Short Term Goal #1  confirm home health services continue to arrive to patient's home and patient is engaging in care    [facility] CM Care Plan Problem Two     Most Recent Value  Care Plan Problem Two  Knowledge deficit related to DM, CHF, COPD home maangement  Role Documenting the Problem Two  Care Management Coordinator  Care Plan for Problem Two  Active  Interventions for Problem Two Long Term Goal   educate patient and family caregivers on action plans for home care of CHF, COPD, DM, use teach back method for evaluation of understanding,  [facility] Long Term Goal  over the next 45 days patient will  verbalize understanding of plan of care for DM, CHF, COPD using the teach back method  [facility] Long Term Goal Start Date  09/24/17  [facility] CM Short Term Goal #1   over the next 14 days patient will allow CM and home health staff to complete home visit  to begin education  [facility] CM Short Term Goal #1 Start Date  09/28/17  Interventions for Short Term Goal #2   complete transition of care calls and home visits, collaborate with home health staff       [place]. [name], RN, BSN, [place] Care Management [phone]

## 2017-10-05 ENCOUNTER — Other Ambulatory Visit: Payer: Self-pay | Admitting: Physician Assistant

## 2017-10-05 DIAGNOSIS — Z09 Encounter for follow-up examination after completed treatment for conditions other than malignant neoplasm: Secondary | ICD-10-CM

## 2017-10-05 DIAGNOSIS — R600 Localized edema: Secondary | ICD-10-CM

## 2017-10-05 DIAGNOSIS — I5032 Chronic diastolic (congestive) heart failure: Secondary | ICD-10-CM

## 2017-10-08 NOTE — Telephone Encounter (Signed)
Refill appropriate 

## 2017-10-15 ENCOUNTER — Telehealth: Payer: Self-pay | Admitting: Family Medicine

## 2017-10-15 NOTE — Telephone Encounter (Signed)
Has had episodes of cellulitis recently which have caused some pinkness and itching. Send in Rx: Keflex 500 mg 1 p.o. 4 times daily times 7 days #28+0.

## 2017-10-15 NOTE — Telephone Encounter (Signed)
Pt's wife called [facility] stating that this pt needed something for his pain and itching on his leg. - No other information was left - please follow up.

## 2017-10-15 NOTE — Telephone Encounter (Signed)
Called placed to patient he states he is itching on both legs and left leg is red and  having pain in both legs. Patient states this has been going on for awhile and is requesting an rx for something to help with itching and redness

## 2017-10-16 MED ORDER — CEPHALEXIN 500 MG PO CAPS: 500.0000 mg | ORAL_CAPSULE | Freq: Four times a day (QID) | ORAL | 0 refills | Status: DC

## 2017-10-16 NOTE — Telephone Encounter (Signed)
Patient aware of medication sent to pharmacy to help with his cellulitis

## 2017-10-18 ENCOUNTER — Other Ambulatory Visit: Payer: Self-pay

## 2017-10-18 ENCOUNTER — Ambulatory Visit (INDEPENDENT_AMBULATORY_CARE_PROVIDER_SITE_OTHER): Payer: Medicare Other | Admitting: Physician Assistant

## 2017-10-18 ENCOUNTER — Encounter: Payer: Self-pay | Admitting: Physician Assistant

## 2017-10-18 VITALS — BP 142/64 | HR 81 | Temp 97.8°F | Resp 18 | Wt 183.4 lb

## 2017-10-18 DIAGNOSIS — L03115 Cellulitis of right lower limb: Secondary | ICD-10-CM

## 2017-10-18 MED ORDER — SULFAMETHOXAZOLE-TRIMETHOPRIM 800-160 MG PO TABS: 1.0000 | ORAL_TABLET | Freq: Two times a day (BID) | ORAL | 0 refills | Status: DC

## 2017-10-18 NOTE — Progress Notes (Signed)
Patient ID: Edward [name] MRN: [id number], DOB: 09-28-1935, 83 y.o. Date of Encounter: 10/18/2017, 12:23 PM    Chief Complaint:  Chief Complaint  Patient presents with  . left leg pain     HPI: 83 y.o. year old male presents with above.   Has history of recurrent cellulitis of his lower leg.  He called recently reporting recurrence.  10/15/17 sent in Rx for Keflex 500 mg 4 times daily times 7 days. Today he reports that he did get that antibiotic but came in today because does not feel like his left lower leg is improving.  No other concerns to address at today's visit.     Home Meds:   Outpatient Medications Prior to Visit  Medication Sig Dispense Refill  . albuterol (PROVENTIL) (2.5 MG/3ML) 0.083% nebulizer solution INHALE 1 VIAL VIA NEBULIZER EVERY 6 HOURS AS NEEDED FOR WHEEZING OR SHORTNESS OF BREATH 360 mL 5  . apixaban (ELIQUIS) 2.5 MG TABS tablet Take 1 tablet (2.5 mg total) by mouth 2 (two) times daily. 60 tablet 2  . budesonide (PULMICORT) 0.5 MG/2ML nebulizer solution Take 2 mLs (0.5 mg total) by nebulization 2 (two) times daily. 120 mL 5  . cephALEXin (KEFLEX) 500 MG capsule Take 1 capsule (500 mg total) by mouth 4 (four) times daily. 28 capsule 0  . diltiazem (CARDIZEM CD) 240 MG 24 hr capsule Take 1 capsule (240 mg total) by mouth daily. 30 capsule 3  . furosemide (LASIX) 40 MG tablet Take 1 tablet (40 mg total) by mouth every other day.    . furosemide (LASIX) 40 MG tablet TAKE 2 TABLETS BY MOUTH THREE TIMES A DAY 180 tablet 1  . insulin NPH Human (NOVOLIN N RELION) 100 UNIT/ML injection Inject 0.07 mLs (7 Units total) into the skin 2 (two) times daily before a meal. 10 mL 2  . ipratropium (ATROVENT) 0.02 % nebulizer solution Take 2.5 mLs (0.5 mg total) by nebulization 4 (four) times daily. 25 mL 12  . losartan (COZAAR) 100 MG tablet Take 1 tablet (100 mg total) by mouth daily. 30 tablet 1  . metoprolol tartrate (LOPRESSOR) 25 MG tablet TAKE 1 TABLET BY MOUTH TWICE  DAILY 60 tablet 0  . omeprazole (PRILOSEC) 20 MG capsule Take 1 capsule (20 mg total) by mouth daily. 30 capsule 3  . OXYGEN Inhale 2 L into the lungs daily.    . potassium chloride SA (K-DUR,KLOR-CON) 20 MEQ tablet Take 1 tablet (20 mEq total) by mouth every other day. Take with lasix.    Marland Kitchen pravastatin (PRAVACHOL) 80 MG tablet Take 1 tablet (80 mg total) by mouth at bedtime. 30 tablet 1   No facility-administered medications prior to visit.     Allergies:  Allergies  Allergen Reactions  . Ace Inhibitors Other (See Comments)    Hyperkalemia--07/23/2013:patient states not familiar with the following allergy      Review of Systems: See HPI for pertinent ROS. All other ROS negative.    Physical Exam: Blood pressure (!) 142/64, pulse 81, temperature 97.8 F (36.6 C), temperature source Oral, resp. rate 18, weight 83.2 kg (183 lb 6.4 oz), SpO2 96 %., Body mass index is 30.52 kg/m. General:  WM. Appears in no acute distress. Neck: Supple. No thyromegaly. No lymphadenopathy. Lungs: Mild wheezes scattered throughout bilaterally. Heart: Regular rhythm. No murmurs, rubs, or gallops. Msk:  Strength and tone normal for age. Extremities/Skin: Left lower leg has a diffuse light pink coloration and is warm to touch.  These  changes start about 2 inches distal to level of left knee and stop above the ankle level.   Neuro: Alert and oriented X 3. Moves all extremities spontaneously. Gait is normal. CNII-XII grossly in tact. Psych:  Responds to questions appropriately with a normal affect.     ASSESSMENT AND PLAN:  83 y.o. year old male with  1. Cellulitis of leg, right He has had the same symptoms in the past and Doppler was negative for DVT and symptoms have resolved with antibiotics.  Keflex was just started so probably has not had much time to take effect but will go ahead and switch to Bactrim as this has also been used in the past and successfully treated his cellulitis in the past.  stop  the Keflex.  Start the Bactrim and take as directed and complete all of it.  Follow-up if leg worsens or does not resolve upon completion of antibiotic. - sulfamethoxazole-trimethoprim (BACTRIM DS,SEPTRA DS) 800-160 MG tablet; Take 1 tablet by mouth 2 (two) times daily.  Dispense: 20 tablet; Refill: 0   Signed, [address] [employer] [place], [place], [facility] 10/18/2017 12:23 PM

## 2017-10-25 ENCOUNTER — Encounter: Payer: Self-pay | Admitting: Physician Assistant

## 2017-10-25 ENCOUNTER — Other Ambulatory Visit: Payer: Self-pay

## 2017-10-25 ENCOUNTER — Telehealth: Payer: Self-pay

## 2017-10-25 ENCOUNTER — Ambulatory Visit (INDEPENDENT_AMBULATORY_CARE_PROVIDER_SITE_OTHER): Payer: Medicare Other | Admitting: Physician Assistant

## 2017-10-25 ENCOUNTER — Telehealth: Payer: Self-pay | Admitting: *Deleted

## 2017-10-25 VITALS — BP 130/50 | HR 86 | Temp 98.2°F | Resp 14 | Wt 173.6 lb

## 2017-10-25 DIAGNOSIS — Z794 Long term (current) use of insulin: Secondary | ICD-10-CM

## 2017-10-25 DIAGNOSIS — E1165 Type 2 diabetes mellitus with hyperglycemia: Secondary | ICD-10-CM | POA: Diagnosis not present

## 2017-10-25 MED ORDER — CLONIDINE HCL 0.1 MG PO TABS: 0.1000 mg | ORAL_TABLET | Freq: Two times a day (BID) | ORAL | 3 refills | Status: DC

## 2017-10-25 NOTE — Progress Notes (Signed)
Patient ID: Edward [name] MRN: [id number], DOB: 08-Jun-1935, 83 y.o. Date of Encounter: 10/25/2017, 2:57 PM    Chief Complaint:  Chief Complaint  Patient presents with  . Hyperglycemia    was out of insulin for 2 weeks      HPI: 83 y.o. year old male since with above.  I reviewed staff note that was placed in chart from earlier today.  Note states "received call from [place].  Reports that patient has been out of insulin for a while.  Son has finally picked up insulin.  States that A1c 11.7, glucose greater than 500, 4+ glucose in urine.  Patient refused to go to ER.  Appointment was scheduled with PCP today at 2:30."  Today Edward [name] reports 83 that he had been out of his insulin for 2 weeks.  States that he son picked up some this morning and he now has insulin available to use.  He also brings with him paperwork that was completed by nurse practitioner with house calls/insurance.  He has no other specific concerns to address today.     Home Meds:   Outpatient Medications Prior to Visit  Medication Sig Dispense Refill  . albuterol (PROVENTIL) (2.5 MG/3ML) 0.083% nebulizer solution INHALE 1 VIAL VIA NEBULIZER EVERY 6 HOURS AS NEEDED FOR WHEEZING OR SHORTNESS OF BREATH 360 mL 5  . apixaban (ELIQUIS) 2.5 MG TABS tablet Take 1 tablet (2.5 mg total) by mouth 2 (two) times daily. 60 tablet 2  . budesonide (PULMICORT) 0.5 MG/2ML nebulizer solution Take 2 mLs (0.5 mg total) by nebulization 2 (two) times daily. 120 mL 5  . diltiazem (CARDIZEM CD) 240 MG 24 hr capsule Take 1 capsule (240 mg total) by mouth daily. 30 capsule 3  . furosemide (LASIX) 40 MG tablet Take 1 tablet (40 mg total) by mouth every other day.    . furosemide (LASIX) 40 MG tablet TAKE 2 TABLETS BY MOUTH THREE TIMES A DAY 180 tablet 1  . insulin NPH Human (NOVOLIN N RELION) 100 UNIT/ML injection Inject 0.07 mLs (7 Units total) into the skin 2 (two) times daily before a meal. 10 mL 2  . ipratropium  (ATROVENT) 0.02 % nebulizer solution Take 2.5 mLs (0.5 mg total) by nebulization 4 (four) times daily. 25 mL 12  . losartan (COZAAR) 100 MG tablet Take 1 tablet (100 mg total) by mouth daily. 30 tablet 1  . metoprolol tartrate (LOPRESSOR) 25 MG tablet TAKE 1 TABLET BY MOUTH TWICE DAILY 60 tablet 0  . omeprazole (PRILOSEC) 20 MG capsule Take 1 capsule (20 mg total) by mouth daily. 30 capsule 3  . OXYGEN Inhale 2 L into the lungs daily.    . potassium chloride SA (K-DUR,KLOR-CON) 20 MEQ tablet Take 1 tablet (20 mEq total) by mouth every other day. Take with lasix.    Marland Kitchen pravastatin (PRAVACHOL) 80 MG tablet Take 1 tablet (80 mg total) by mouth at bedtime. 30 tablet 1  . sulfamethoxazole-trimethoprim (BACTRIM DS,SEPTRA DS) 800-160 MG tablet Take 1 tablet by mouth 2 (two) times daily. 20 tablet 0  . cephALEXin (KEFLEX) 500 MG capsule Take 1 capsule (500 mg total) by mouth 4 (four) times daily. 28 capsule 0   No facility-administered medications prior to visit.     Allergies:  Allergies  Allergen Reactions  . Ace Inhibitors Other (See Comments)    Hyperkalemia--07/23/2013:patient states not familiar with the following allergy      Review of Systems: See HPI for pertinent  ROS. All other ROS negative.    Physical Exam: Blood pressure (!) 130/50, pulse 86, temperature 98.2 F (36.8 C), temperature source Oral, resp. rate 14, weight 78.7 kg (173 lb 9.6 oz), SpO2 (!) 83 %., There is no height or weight on file to calculate BMI. General:  WM. Appears in no acute distress. Neck: Supple. No thyromegaly. No lymphadenopathy. Lungs: Mild wheezes scattered throughout bilaterally. Heart: Regular rhythm. No murmurs, rubs, or gallops. Abdomen: Soft, non-tender, non-distended with normoactive bowel sounds. No hepatomegaly. No rebound/guarding. No obvious abdominal masses. Msk:  Strength and tone normal for age. Extremities/Skin: Warm and dry.  Trace lower extremity edema bilaterally.  Much  improved. Neuro: Alert and oriented X 3. Moves all extremities spontaneously. Gait is normal. CNII-XII grossly in tact. Psych:  Responds to questions appropriately with a normal affect.     ASSESSMENT AND PLAN:  83 y.o. year old male with   1. Uncontrolled type 2 diabetes mellitus with hyperglycemia, with long-term current use of insulin ([place]) He has a long history of noncompliance. See my recent office notes and also all of the other notes from [facility] and other Assistance Programs. He reports that his son did go pick up insulin this morning and he now has insulin available to use and he is to use this as directed.     Marin Olp [place], [place], [name] 10/25/2017 2:57 PM

## 2017-10-25 NOTE — Telephone Encounter (Signed)
Fax came through from [facility] that there has been a recall  on patient losartan.Per Pcp she will change from losartan to clonidine 0.1mg  bid,.Patient aware

## 2017-10-25 NOTE — Telephone Encounter (Signed)
Received call from [place] [facility].   Reports that patient has been out of medictions for a while, and son has finally picked up insulin.   States that A1C 11.7%, glucose >500, and patient has 4+ glucose in urine.   Patient refused to fo to ER as was [facility] SN recommendation. Appointment scheduled for PCP at 2:30pm.

## 2017-11-02 DIAGNOSIS — J449 Chronic obstructive pulmonary disease, unspecified: Secondary | ICD-10-CM | POA: Diagnosis not present

## 2017-11-05 ENCOUNTER — Other Ambulatory Visit: Payer: Self-pay | Admitting: Physician Assistant

## 2017-11-05 DIAGNOSIS — R600 Localized edema: Secondary | ICD-10-CM

## 2017-11-05 DIAGNOSIS — I5032 Chronic diastolic (congestive) heart failure: Secondary | ICD-10-CM

## 2017-11-05 DIAGNOSIS — Z09 Encounter for follow-up examination after completed treatment for conditions other than malignant neoplasm: Secondary | ICD-10-CM

## 2017-11-06 NOTE — Telephone Encounter (Signed)
Refill appropriate 

## 2017-11-26 ENCOUNTER — Other Ambulatory Visit: Payer: Self-pay | Admitting: Physician Assistant

## 2017-11-30 ENCOUNTER — Other Ambulatory Visit: Payer: Self-pay | Admitting: Physician Assistant

## 2017-11-30 DIAGNOSIS — J449 Chronic obstructive pulmonary disease, unspecified: Secondary | ICD-10-CM | POA: Diagnosis not present

## 2017-12-03 NOTE — Telephone Encounter (Signed)
Refill appropriate 

## 2017-12-12 ENCOUNTER — Encounter: Payer: Self-pay | Admitting: Physician Assistant

## 2017-12-12 ENCOUNTER — Ambulatory Visit (INDEPENDENT_AMBULATORY_CARE_PROVIDER_SITE_OTHER): Payer: Medicare Other | Admitting: Physician Assistant

## 2017-12-12 VITALS — BP 136/60 | HR 68 | Temp 97.5°F | Resp 16 | Wt 181.6 lb

## 2017-12-12 DIAGNOSIS — E7800 Pure hypercholesterolemia, unspecified: Secondary | ICD-10-CM | POA: Diagnosis not present

## 2017-12-12 DIAGNOSIS — Z794 Long term (current) use of insulin: Secondary | ICD-10-CM | POA: Diagnosis not present

## 2017-12-12 DIAGNOSIS — E1165 Type 2 diabetes mellitus with hyperglycemia: Secondary | ICD-10-CM

## 2017-12-12 DIAGNOSIS — I4891 Unspecified atrial fibrillation: Secondary | ICD-10-CM | POA: Diagnosis not present

## 2017-12-12 DIAGNOSIS — E785 Hyperlipidemia, unspecified: Secondary | ICD-10-CM

## 2017-12-12 DIAGNOSIS — I5032 Chronic diastolic (congestive) heart failure: Secondary | ICD-10-CM

## 2017-12-12 DIAGNOSIS — I10 Essential (primary) hypertension: Secondary | ICD-10-CM | POA: Diagnosis not present

## 2017-12-12 DIAGNOSIS — J439 Emphysema, unspecified: Secondary | ICD-10-CM

## 2017-12-12 DIAGNOSIS — Z9114 Patient's other noncompliance with medication regimen: Secondary | ICD-10-CM | POA: Diagnosis not present

## 2017-12-12 DIAGNOSIS — N183 Chronic kidney disease, stage 3 unspecified: Secondary | ICD-10-CM

## 2017-12-12 DIAGNOSIS — R600 Localized edema: Secondary | ICD-10-CM | POA: Diagnosis not present

## 2017-12-12 LAB — COMPLETE METABOLIC PANEL WITH GFR
AG RATIO: 1.7 (calc) (ref 1.0–2.5)
ALT: 12 U/L (ref 9–46)
AST: 17 U/L (ref 10–35)
Albumin: 4.4 g/dL (ref 3.6–5.1)
Alkaline phosphatase (APISO): 61 U/L (ref 40–115)
BUN/Creatinine Ratio: 33 (calc) — ABNORMAL HIGH (ref 6–22)
BUN: 55 mg/dL — AB (ref 7–25)
CALCIUM: 9.9 mg/dL (ref 8.6–10.3)
CO2: 29 mmol/L (ref 20–32)
CREATININE: 1.65 mg/dL — AB (ref 0.70–1.11)
Chloride: 98 mmol/L (ref 98–110)
GFR, Est African American: 45 mL/min/{1.73_m2} — ABNORMAL LOW (ref >=60)
GFR, Est Non African American: 39 mL/min/{1.73_m2} — ABNORMAL LOW (ref >=60)
GLUCOSE: 70 mg/dL (ref 65–99)
Globulin: 2.6 g/dL (calc) (ref 1.9–3.7)
POTASSIUM: 4.9 mmol/L (ref 3.5–5.3)
Sodium: 135 mmol/L (ref 135–146)
Total Bilirubin: 0.2 mg/dL (ref 0.2–1.2)
Total Protein: 7 g/dL (ref 6.1–8.1)

## 2017-12-12 NOTE — Progress Notes (Signed)
Patient ID: Edward [name] MRN: [id number], DOB: [birth year], 83 y.o. Date of Encounter: @DATE @  Chief Complaint:  Chief Complaint  Patient presents with  . 3 month follow up    HPI: 83 y.o. year old male  presents for routine f/u OV.  He reports that he has been taking all oral medications as directed. Says that he has been using insulin as directed. Initially he responds that he has been using his breathing medicines also but then later says that he has been having some cough and wheeze especially at night and then when asked again he says now he is not using any breathing medicine says "You havent given me any "  He has no specific concerns to address today.  Here for routine follow-up visit.  Brings in a blood sugar log form but it is from October 2018.   Past Medical History:  Diagnosis Date  . Allergy    Rhinitis  . Bronchitis   . Chronic respiratory failure ([place])   . Colon polyps   . COPD (chronic obstructive pulmonary disease) ([place])   . Diabetes mellitus   . Elevated lipids   . Hypercholesterolemia   . Hypertension   . Noncompliance   . On home O2    2L N/C   . PSA elevation   . Pulmonary fibrosis ([place])   . Vitamin D deficiency      Home Meds: Outpatient Medications Prior to Visit  Medication Sig Dispense Refill  . albuterol (PROVENTIL) (2.5 MG/3ML) 0.083% nebulizer solution INHALE 1 VIAL VIA NEBULIZER EVERY 6 HOURS AS NEEDED FOR WHEEZING OR SHORTNESS OF BREATH 360 mL 5  . apixaban (ELIQUIS) 2.5 MG TABS tablet Take 1 tablet (2.5 mg total) by mouth 2 (two) times daily. 60 tablet 2  . budesonide (PULMICORT) 0.5 MG/2ML nebulizer solution Take 2 mLs (0.5 mg total) by nebulization 2 (two) times daily. 120 mL 5  . cloNIDine (CATAPRES) 0.1 MG tablet Take 1 tablet (0.1 mg total) by mouth 2 (two) times daily. 60 tablet 3  . diltiazem (CARDIZEM CD) 240 MG 24 hr capsule TAKE 1 CAPSULE BY MOUTH DAILY 30 capsule 2  . furosemide (LASIX) 40 MG tablet Take 1 tablet (40 mg  total) by mouth every other day.    . insulin NPH Human (NOVOLIN N RELION) 100 UNIT/ML injection Inject 0.07 mLs (7 Units total) into the skin 2 (two) times daily before a meal. 10 mL 2  . ipratropium (ATROVENT) 0.02 % nebulizer solution Take 2.5 mLs (0.5 mg total) by nebulization 4 (four) times daily. 25 mL 12  . losartan (COZAAR) 100 MG tablet Take 1 tablet (100 mg total) by mouth daily. 30 tablet 1  . metoprolol tartrate (LOPRESSOR) 25 MG tablet TAKE 1 TABLET BY MOUTH TWICE DAILY 60 tablet 0  . NOVOLIN N RELION 100 UNIT/ML injection INJECT 30 UNITS SUBCUTANEOUSLY WITH BREAKFAST AND 30 UNITS WITH SUPPER DAILY 10 mL 2  . omeprazole (PRILOSEC) 20 MG capsule Take 1 capsule (20 mg total) by mouth daily. 30 capsule 3  . ONE TOUCH ULTRA TEST test strip CHECK FASTING BLOOD SUGAR TWICE DAILY 100 each 4  . OXYGEN Inhale 2 L into the lungs daily.    . potassium chloride SA (K-DUR,KLOR-CON) 20 MEQ tablet Take 1 tablet (20 mEq total) by mouth every other day. Take with lasix.    Marland Kitchen potassium chloride SA (K-DUR,KLOR-CON) 20 MEQ tablet TAKE 2 TABLETS BY MOUTH THREE TIMES A DAY 180 tablet 2  .  pravastatin (PRAVACHOL) 80 MG tablet TAKE 1 TABLET BY MOUTH AT BEDTIME 30 tablet 0  . furosemide (LASIX) 40 MG tablet TAKE 2 TABLETS BY MOUTH THREE TIMES A DAY 180 tablet 1  . sulfamethoxazole-trimethoprim (BACTRIM DS,SEPTRA DS) 800-160 MG tablet Take 1 tablet by mouth 2 (two) times daily. 20 tablet 0   No facility-administered medications prior to visit.     Allergies:  Allergies  Allergen Reactions  . Ace Inhibitors Other (See Comments)    Hyperkalemia--07/23/2013:patient states not familiar with the following allergy    Social History   Socioeconomic History  . Marital status: Married    Spouse name: Not on file  . Number of children: Not on file  . Years of education: Not on file  . Highest education level: Not on file  Social Needs  . Financial resource strain: Not on file  . Food insecurity - worry:  Not on file  . Food insecurity - inability: Not on file  . Transportation needs - medical: Not on file  . Transportation needs - non-medical: Not on file  Occupational History  . 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 Smoking status: Former Smoker    Packs/day: 1.50    Years: 60.00    Pack years: 90.00    Types: Cigarettes    Last attempt to quit: 12/31/2012    Years since quitting: 4.9  . Smokeless tobacco: Never Used  Substance and Sexual Activity  . Alcohol use: No  . Drug use: No  . Sexual activity: Yes    Birth control/protection: None  Other Topics Concern  . Not on file  Social History Narrative  . Not on file    Family History  Problem Relation Age of Onset  . Heart disease Mother   . CAD Other   . Diabetes Other      Review of Systems:  See HPI for pertinent ROS. All other ROS negative.    Physical Exam: Blood pressure 136/60, pulse 68, temperature (!) 97.5 F (36.4 C), temperature source Oral, resp. rate 16, weight 82.4 kg (181 lb 9.6 oz), SpO2 92 %., Body mass index is 30.22 kg/m. General: WM. Appears in no acute distress. Neck: Supple. No thyromegaly. No lymphadenopathy. Lungs: Distant, decreased breath sounds.  Very mild scattered wheeze. Heart: Regular rhythm Abdomen: Soft, non-tender, non-distended with normoactive bowel sounds. No hepatomegaly. No rebound/guarding. No obvious abdominal masses. Musculoskeletal:  Strength and tone normal for age. Extremities/Skin: Trace LE edema Neuro: Alert and oriented X 3. Moves all extremities spontaneously. Gait is normal. CNII-XII grossly in tact. Psych:  Responds to questions appropriately with a normal affect.     ASSESSMENT AND PLAN:  83 y.o. year old male with  1. Essential hypertension Blood pressure is at goal/controlled at 136/60 today.  Continue current medication.  Check lab to monitor. - COMPLETE METABOLIC PANEL WITH GFR  2. Atrial fibrillation, unspecified type  [facility]) He is on Eliquis.  Cardizem.  3. Chronic diastolic heart failure ([place]) Table, controlled.  Continue current medication.  Check lab to monitor. - COMPLETE METABOLIC PANEL WITH GFR  4. Pulmonary emphysema, unspecified emphysema type ([place]) Does have prescribed nebulized treatments as well as inhaler treatments.  We have discussed proper use of these multiple times at prior visits and have made sure that these prescriptions have been sent.  I have reviewed with him again today proper use of these medications.  New current medications.  He has having no current exacerbation  to require additional Rx at this time.  5. Uncontrolled type 2 diabetes mellitus with hyperglycemia, with long-term current use of insulin ([name]) A1c was less than 3 months ago so we will not repeat this today.  Will check lab.  Reminded him the importance of compliance with medicines including insulin. - COMPLETE METABOLIC PANEL WITH GFR  6. CKD (chronic kidney disease), stage III ([name]) Check lab to monitor. - COMPLETE METABOLIC PANEL WITH GFR  7. Elevated lipids Lipids were at goal 09/2017 with LDL 43.  Continue current medication.  Check LFTs now to monitor. - COMPLETE METABOLIC PANEL WITH GFR  8. Hypercholesterolemia Lipids were at goal 09/2017 with LDL 43.  Continue current medication.  Check LFTs now to monitor. - COMPLETE METABOLIC PANEL WITH GFR  9. Non compliance w medication regimen - COMPLETE METABOLIC PANEL WITH GFR  10. Bilateral lower extremity edema His lower extremity edema is actually stable and controlled today.  We will continue current medications.  Check lab. - COMPLETE METABOLIC PANEL WITH GFR  Plan routine visit in 3 months.  Follow-up sooner if needed.   Signed, [address] [place], [place], [facility] 12/12/2017 9:11 AM

## 2017-12-28 ENCOUNTER — Other Ambulatory Visit: Payer: Self-pay | Admitting: Physician Assistant

## 2017-12-28 DIAGNOSIS — I5032 Chronic diastolic (congestive) heart failure: Secondary | ICD-10-CM

## 2017-12-28 DIAGNOSIS — Z09 Encounter for follow-up examination after completed treatment for conditions other than malignant neoplasm: Secondary | ICD-10-CM

## 2017-12-28 DIAGNOSIS — R600 Localized edema: Secondary | ICD-10-CM

## 2017-12-31 DIAGNOSIS — J449 Chronic obstructive pulmonary disease, unspecified: Secondary | ICD-10-CM | POA: Diagnosis not present

## 2018-01-07 ENCOUNTER — Other Ambulatory Visit: Payer: Self-pay | Admitting: Physician Assistant

## 2018-01-08 ENCOUNTER — Other Ambulatory Visit: Payer: Self-pay | Admitting: *Deleted

## 2018-01-08 NOTE — Patient Outreach (Addendum)
[facility]) Care Management  01/08/2018  Edward [name] Aug 13, 1935 [id number]   Case closure  [facility] Cm called to follow up with Edward [name] on 01/02/18 at 1718. He informed CM he was doing well but also confirms he continues not to check his weight nor cbgs as ordered and reports swelling of lower extremities. Cm inquired if he was calling to report these symptoms to his MD and he reported he had not but had seen MD in March 2019 for a 3 month follow up visit. Last A1c was 11 on 09/26/17. Edward Johnston has remained out of the hospital since 09/26/17 and home health services has been completed   Plans case closure for Member is non-adherent with Care Plan as agreed upon by Edward Carmen Letters sent to patient and MD  [place] Problem One     Most Recent Value  Care Plan Problem One  transition care services  Role Documenting the Problem One  Care Management [place] for Problem One  Active  [facility] Long Term Goal   over the next 31 days, patient will work with [facility] CM to understand transition of care needs   [facility] Long Term Goal Start Date  09/28/17  [facility] Long Term Goal Met Date  10/04/17  (Pended)   [facility] CM Short Term Goal #1   over the next 14 days patient will work with home health care staff as scheduled  [facility] CM Short Term Goal #1 Start Date  09/28/17  [facility] CM Short Term Goal #1 Met Date  10/04/17  (Pended)     [facility] CM Care Plan Problem Two     Most Recent Value  Care Plan Problem Two  Knowledge deficit related to DM, CHF, COPD home maangement  Role Documenting the Problem Two  Care Management Coordinator  Care Plan for Problem Two  Active  Interventions for Problem Two Long Term Goal   continues not to monitor cbgs nor wts as ordered   (Pended)   [facility] Long Term Goal  over the next 45 days patient will verbalize understanding of plan of care for DM, CHF, COPD using the teach back method  [facility] Long Term Goal Start Date  09/24/17  [facility] Long Term Goal Met Date   01/02/18  (Pended)   [facility] CM Short Term Goal #1   over the next 14 days patient will allow CM and home health staff to complete home visit  to begin education  [facility] CM Short Term Goal #1 Start Date  09/28/17  [facility] CM Short Term Goal #1 Met Date   10/04/17  (Pended)        Kimberly L. [name], RN, BSN, Learned Coordinator [phone] week day mobile

## 2018-01-25 ENCOUNTER — Other Ambulatory Visit: Payer: Self-pay | Admitting: Physician Assistant

## 2018-01-30 ENCOUNTER — Emergency Department (HOSPITAL_COMMUNITY)
Admission: EM | Admit: 2018-01-30 | Discharge: 2018-01-30 | Disposition: A | Discharge status: Home / Self Care | Payer: Medicare Other | Source: Home / Self Care | Attending: Emergency Medicine | Admitting: Emergency Medicine

## 2018-01-30 ENCOUNTER — Emergency Department (HOSPITAL_COMMUNITY): Payer: Medicare Other

## 2018-01-30 ENCOUNTER — Other Ambulatory Visit: Payer: Self-pay

## 2018-01-30 ENCOUNTER — Encounter (HOSPITAL_COMMUNITY): Payer: Self-pay | Admitting: Emergency Medicine

## 2018-01-30 DIAGNOSIS — E119 Type 2 diabetes mellitus without complications: Secondary | ICD-10-CM

## 2018-01-30 DIAGNOSIS — N183 Chronic kidney disease, stage 3 (moderate): Secondary | ICD-10-CM | POA: Insufficient documentation

## 2018-01-30 DIAGNOSIS — Z87891 Personal history of nicotine dependence: Secondary | ICD-10-CM | POA: Insufficient documentation

## 2018-01-30 DIAGNOSIS — I5032 Chronic diastolic (congestive) heart failure: Secondary | ICD-10-CM | POA: Insufficient documentation

## 2018-01-30 DIAGNOSIS — J189 Pneumonia, unspecified organism: Secondary | ICD-10-CM | POA: Diagnosis not present

## 2018-01-30 DIAGNOSIS — I130 Hypertensive heart and chronic kidney disease with heart failure and stage 1 through stage 4 chronic kidney disease, or unspecified chronic kidney disease: Secondary | ICD-10-CM

## 2018-01-30 DIAGNOSIS — Z794 Long term (current) use of insulin: Secondary | ICD-10-CM

## 2018-01-30 DIAGNOSIS — J449 Chronic obstructive pulmonary disease, unspecified: Secondary | ICD-10-CM | POA: Diagnosis not present

## 2018-01-30 DIAGNOSIS — Z79899 Other long term (current) drug therapy: Secondary | ICD-10-CM | POA: Insufficient documentation

## 2018-01-30 DIAGNOSIS — R0602 Shortness of breath: Secondary | ICD-10-CM | POA: Diagnosis not present

## 2018-01-30 DIAGNOSIS — R05 Cough: Secondary | ICD-10-CM | POA: Diagnosis not present

## 2018-01-30 DIAGNOSIS — J441 Chronic obstructive pulmonary disease with (acute) exacerbation: Secondary | ICD-10-CM

## 2018-01-30 DIAGNOSIS — R739 Hyperglycemia, unspecified: Secondary | ICD-10-CM | POA: Diagnosis not present

## 2018-01-30 DIAGNOSIS — R0682 Tachypnea, not elsewhere classified: Secondary | ICD-10-CM | POA: Diagnosis not present

## 2018-01-30 DIAGNOSIS — R062 Wheezing: Secondary | ICD-10-CM | POA: Diagnosis not present

## 2018-01-30 LAB — BRAIN NATRIURETIC PEPTIDE: B Natriuretic Peptide: 39 pg/mL (ref 0.0–100.0)

## 2018-01-30 LAB — CBC WITH DIFFERENTIAL/PLATELET
BASOS ABS: 0 10*3/uL (ref 0.0–0.1)
BASOS PCT: 1 %
EOS ABS: 1 10*3/uL — AB (ref 0.0–0.7)
EOS PCT: 13 %
HCT: 33.6 % — ABNORMAL LOW (ref 39.0–52.0)
Hemoglobin: 9.8 g/dL — ABNORMAL LOW (ref 13.0–17.0)
Lymphocytes Relative: 20 %
Lymphs Abs: 1.6 10*3/uL (ref 0.7–4.0)
MCH: 21.9 pg — ABNORMAL LOW (ref 26.0–34.0)
MCHC: 29.2 g/dL — ABNORMAL LOW (ref 30.0–36.0)
MCV: 75.2 fL — AB (ref 78.0–100.0)
MONO ABS: 0.9 10*3/uL (ref 0.1–1.0)
Monocytes Relative: 12 %
Neutro Abs: 4.1 10*3/uL (ref 1.7–7.7)
Neutrophils Relative %: 54 %
PLATELETS: 374 10*3/uL (ref 150–400)
RBC: 4.47 MIL/uL (ref 4.22–5.81)
RDW: 17.6 % — AB (ref 11.5–15.5)
WBC: 7.6 10*3/uL (ref 4.0–10.5)

## 2018-01-30 LAB — TROPONIN I: Troponin I: <0.03 ng/mL (ref <0.03)

## 2018-01-30 LAB — BASIC METABOLIC PANEL
ANION GAP: 12 (ref 5–15)
BUN: 58 mg/dL — ABNORMAL HIGH (ref 6–20)
CALCIUM: 9.9 mg/dL (ref 8.9–10.3)
CO2: 29 mmol/L (ref 22–32)
Chloride: 94 mmol/L — ABNORMAL LOW (ref 101–111)
Creatinine, Ser: 1.61 mg/dL — ABNORMAL HIGH (ref 0.61–1.24)
GFR, EST AFRICAN AMERICAN: 45 mL/min — AB (ref >60)
GFR, EST NON AFRICAN AMERICAN: 39 mL/min — AB (ref >60)
GLUCOSE: 246 mg/dL — AB (ref 65–99)
Potassium: 5 mmol/L (ref 3.5–5.1)
SODIUM: 135 mmol/L (ref 135–145)

## 2018-01-30 MED ORDER — DOXYCYCLINE HYCLATE 100 MG PO CAPS: 100.0000 mg | ORAL_CAPSULE | Freq: Two times a day (BID) | ORAL | 0 refills | Status: DC

## 2018-01-30 MED ORDER — IPRATROPIUM-ALBUTEROL 0.5-2.5 (3) MG/3ML IN SOLN
3.0000 mL | Freq: Once | RESPIRATORY_TRACT | Status: DC
  Filled 2018-01-30: qty 3

## 2018-01-30 MED ORDER — METHYLPREDNISOLONE SODIUM SUCC 125 MG IJ SOLR
125.0000 mg | Freq: Once | INTRAMUSCULAR | Status: AC
  Administered 2018-01-30: 125 mg via INTRAVENOUS
  Filled 2018-01-30: qty 2

## 2018-01-30 MED ORDER — PREDNISONE 20 MG PO TABS: 40.0000 mg | ORAL_TABLET | Freq: Every day | ORAL | 0 refills | Status: DC

## 2018-01-30 MED ORDER — IPRATROPIUM BROMIDE 0.02 % IN SOLN
0.5000 mg | Freq: Once | RESPIRATORY_TRACT | Status: AC
  Administered 2018-01-30: 0.5 mg via RESPIRATORY_TRACT
  Filled 2018-01-30: qty 2.5

## 2018-01-30 MED ORDER — IPRATROPIUM-ALBUTEROL 0.5-2.5 (3) MG/3ML IN SOLN
3.0000 mL | Freq: Once | RESPIRATORY_TRACT | Status: AC
  Administered 2018-01-30: 3 mL via RESPIRATORY_TRACT
  Filled 2018-01-30: qty 3

## 2018-01-30 MED ORDER — IPRATROPIUM-ALBUTEROL 0.5-2.5 (3) MG/3ML IN SOLN
3.0000 mL | Freq: Once | RESPIRATORY_TRACT | Status: AC
  Administered 2018-01-30: 3 mL via RESPIRATORY_TRACT

## 2018-01-30 MED ORDER — ALBUTEROL (5 MG/ML) CONTINUOUS INHALATION SOLN
15.0000 mg/h | INHALATION_SOLUTION | Freq: Once | RESPIRATORY_TRACT | Status: AC
  Administered 2018-01-30: 15 mg/h via RESPIRATORY_TRACT
  Filled 2018-01-30: qty 20

## 2018-01-30 NOTE — ED Triage Notes (Signed)
Pt c/o increased sob over the last couple of weeks. Pt states his home oxygen and breathing treatments are not working.

## 2018-01-30 NOTE — ED Provider Notes (Signed)
[facility] EMERGENCY DEPARTMENT Provider Note   CSN: [id number] Arrival date & time: 01/30/18  0327     History   Chief Complaint Chief Complaint  Patient presents with  . Shortness of Breath    HPI Edward [name] is a 83 y.o. male.  HPI  This is an 83 year old male with a history of COPD and chronic respiratory failure, diabetes, hypertension who presents with shortness of breath.  Patient reports 2 to 3-week history of worsening shortness of breath.  Denies orthopnea.  Does report dyspnea on exertion.  He reports a nonproductive cough and "I just cannot get anything up."  He denies any fevers.  He has been using his nebulizer at home with minimal relief.  Denies increased oxygen requirements or lower extremity edema.  Denies chest pain.  Denies nausea, vomiting, shortness of breath.  Past Medical History:  Diagnosis Date  . Allergy    Rhinitis  . Bronchitis   . Chronic respiratory failure ([place])   . Colon polyps   . COPD (chronic obstructive pulmonary disease) ([place])   . Diabetes mellitus   . Elevated lipids   . Hypercholesterolemia   . Hypertension   . Noncompliance   . On home O2    2L N/C   . PSA elevation   . Pulmonary fibrosis ([place])   . Vitamin D deficiency     Patient Active Problem List   Diagnosis Date Noted  . Acute encephalopathy 09/25/2017  . Acute confusion 09/25/2017  . On home O2 09/25/2017  . Bilateral lower extremity edema 06/27/2017  . Insomnia 03/28/2017  . Uncontrolled type 2 diabetes mellitus with hyperglycemia, with long-term current use of insulin ([place]) 03/07/2017  . CKD (chronic kidney disease), stage III ([place]) 03/07/2017  . Palliative care encounter   . Goals of care, counseling/discussion   . Encounter for hospice care discussion   . AF (paroxysmal atrial fibrillation) ([place]) 03/05/2017  . Chronic diastolic CHF (congestive heart failure) ([place]) 03/04/2017  . Chronic respiratory failure ([place]) 03/04/2017  . Atrial fibrillation with rapid  ventricular response ([place])   . Chronic diastolic heart failure ([place])   . Constipation 02/25/2017  . Overflow diarrhea/Constipation 02/25/2017  . Diabetes mellitus type 2, uncontrolled, without complications ([place]) 22/11/5425  . Non compliance w medication regimen 12/02/2015  . Hypercholesterolemia 05/12/2014  . Elevated LFTs 05/12/2014  . Foot laceration 05/12/2014  . Dyspnea 12/19/2013  . COPD exacerbation ([place]) 11/15/2013  . Atrial fibrillation ([place]) 11/10/2013  . Elevated PSA 01/20/2013  . Diabetes mellitus type 2, uncontrolled ([place])   . COPD (chronic obstructive pulmonary disease) ([place])   . Hypertension   . Allergy   . Elevated lipids   . Pulmonary fibrosis ([employer])   . Bronchitis   . Colon polyps   . Colon polyps     Past Surgical History:  Procedure Laterality Date  . CATARACT EXTRACTION W/PHACO  06/25/2012   Procedure: CATARACT EXTRACTION PHACO AND INTRAOCULAR LENS PLACEMENT (IOC);  Surgeon: Elta Guadeloupe T. Gershon Crane, MD;  Location: [facility];  Service: Ophthalmology;  Laterality: Left;  CDE=19.01  . CATARACT EXTRACTION W/PHACO  07/09/2012   Procedure: CATARACT EXTRACTION PHACO AND INTRAOCULAR LENS PLACEMENT (IOC);  Surgeon: Elta Guadeloupe T. Gershon Crane, MD;  Location: [facility];  Service: Ophthalmology;  Laterality: Right;  CDE: 20.09        Home Medications    Prior to Admission medications   Medication Sig Start Date End Date Taking? Authorizing Provider  albuterol (PROVENTIL) (2.5 MG/3ML) 0.083% nebulizer solution INHALE 1 VIAL  VIA NEBULIZER EVERY 6 HOURS AS NEEDED FOR WHEEZING OR SHORTNESS OF BREATH 07/19/17   Orlena Sheldon, PA-C  apixaban (ELIQUIS) 2.5 MG TABS tablet Take 1 tablet (2.5 mg total) by mouth 2 (two) times daily. 08/30/17   Dena Billet B, PA-C  budesonide (PULMICORT) 0.5 MG/2ML nebulizer solution Take 2 mLs (0.5 mg total) by nebulization 2 (two) times daily. 07/19/17   Dena Billet B, PA-C  cloNIDine (CATAPRES) 0.1 MG tablet TAKE 1 TABLET BY MOUTH 2 TIMES A DAY 01/28/18   Dena Billet B,  PA-C  diltiazem (CARDIZEM CD) 240 MG 24 hr capsule TAKE 1 CAPSULE BY MOUTH DAILY 12/03/17   Orlena Sheldon, PA-C  doxycycline (VIBRAMYCIN) 100 MG capsule Take 1 capsule (100 mg total) by mouth 2 (two) times daily. 01/30/18   Tajanay Hurley, Barbette Hair, MD  furosemide (LASIX) 40 MG tablet Take 1 tablet (40 mg total) by mouth every other day. 09/26/17 09/26/18  [name], MD  furosemide (LASIX) 40 MG tablet TAKE 2 TABLETS BY MOUTH THREE TIMES A DAY 12/31/17   Dixon, Stanton Kidney B, PA-C  insulin NPH Human (NOVOLIN N RELION) 100 UNIT/ML injection Inject 0.07 mLs (7 Units total) into the skin 2 (two) times daily before a meal. 09/26/17   Johnson, Clanford L, MD  ipratropium (ATROVENT) 0.02 % nebulizer solution Take 2.5 mLs (0.5 mg total) by nebulization 4 (four) times daily. 07/19/17   Dena Billet B, PA-C  losartan (COZAAR) 100 MG tablet TAKE 1 TABLET BY MOUTH DAILY 12/31/17   Dena Billet B, PA-C  metoprolol tartrate (LOPRESSOR) 25 MG tablet TAKE 1 TABLET BY MOUTH TWICE DAILY 12/31/17   Dena Billet B, PA-C  NOVOLIN N RELION 100 UNIT/ML injection  INJECT 30 UNITS SUBCUTANEOUSLY WITH BREAKFAST AND SUPPER 01/07/18   Orlena Sheldon, PA-C  omeprazole (PRILOSEC) 20 MG capsule Take 1 capsule (20 mg total) by mouth daily. 03/23/16   Orlena Sheldon, PA-C  ONE TOUCH ULTRA TEST test strip CHECK FASTING BLOOD SUGAR TWICE DAILY 12/03/17   Dena Billet B, PA-C  OXYGEN Inhale 2 L into the lungs daily.    [provider]  potassium chloride SA (K-DUR,KLOR-CON) 20 MEQ tablet Take 1 tablet (20 mEq total) by mouth every other day. Take with lasix. 09/26/17   Johnson, Clanford L, MD  potassium chloride SA (K-DUR,KLOR-CON) 20 MEQ tablet TAKE 2 TABLETS BY MOUTH THREE TIMES A DAY 11/06/17   Dena Billet B, PA-C  pravastatin (PRAVACHOL) 80 MG tablet TAKE 1 TABLET BY MOUTH AT BEDTIME 01/28/18   Dixon, Lonie Peak, PA-C  predniSONE (DELTASONE) 20 MG tablet Take 2 tablets (40 mg total) by mouth daily. 01/30/18   Ozzy Bohlken, Barbette Hair, MD    Family  History Family History  Problem Relation Age of Onset  . Heart disease Mother   . CAD Other   . Diabetes Other     Social History Social History   Tobacco Use  . Smoking status: Former Smoker    Packs/day: 1.50    Years: 60.00    Pack years: 90.00    Types: Cigarettes    Last attempt to quit: 12/31/2012    Years since quitting: 5.0  . Smokeless tobacco: Never Used  Substance Use Topics  . Alcohol use: No  . Drug use: No     Allergies   Ace inhibitors   Review of Systems Review of Systems  Constitutional: Negative for fever.  Respiratory: Positive for cough, shortness of breath and wheezing.   Cardiovascular:  Negative for chest pain.  Gastrointestinal: Negative for abdominal pain, nausea and vomiting.  Genitourinary: Negative for dysuria.  All other systems reviewed and are negative.    Physical Exam Updated Vital Signs BP 119/66   Pulse 72   Temp 97.8 F (36.6 C)   Resp 16   Ht 5\' 5"  (1.651 m)   Wt 72.6 kg (160 lb)   SpO2 96%   BMI 26.63 kg/m   Physical Exam  Constitutional: He is oriented to person, place, and time.  Chronically ill-appearing, elderly, nontoxic  HENT:  Head: Normocephalic and atraumatic.  Cardiovascular: Normal rate, regular rhythm and normal heart sounds.  No murmur heard. Pulmonary/Chest: No accessory muscle usage. Tachypnea noted. No respiratory distress. He has wheezes.  Poor air movement, expiratory wheezing in all lung fields, mild tachypnea noted, nasal cannula in place with 3 L of oxygen  Abdominal: Soft. Bowel sounds are normal. There is no tenderness. There is no rebound.  Musculoskeletal: He exhibits no edema.  Trace bilateral lower extremity edema  Lymphadenopathy:    He has no cervical adenopathy.  Neurological: He is alert and oriented to person, place, and time.  Skin: Skin is warm and dry.  Psychiatric: He has a normal mood and affect.  Nursing note and vitals reviewed.    ED Treatments / Results  Labs (all  labs ordered are listed, but only abnormal results are displayed) Labs Reviewed  CBC WITH DIFFERENTIAL/PLATELET - Abnormal; Notable for the following components:      Result Value   Hemoglobin 9.8 (*)    HCT 33.6 (*)    MCV 75.2 (*)    MCH 21.9 (*)    MCHC 29.2 (*)    RDW 17.6 (*)    Eosinophils Absolute 1.0 (*)    All other components within normal limits  BASIC METABOLIC PANEL - Abnormal; Notable for the following components:   Chloride 94 (*)    Glucose, Bld 246 (*)    BUN 58 (*)    Creatinine, Ser 1.61 (*)    GFR calc non Af Amer 39 (*)    GFR calc Af Amer 45 (*)    All other components within normal limits  TROPONIN I  BRAIN NATRIURETIC PEPTIDE    EKG EKG Interpretation  Date/Time:  Wednesday Jan 30 2018 03:35:13 EDT Ventricular Rate:  98 PR Interval:    QRS Duration: 89 QT Interval:  331 QTC Calculation: 423 R Axis:   71 Text Interpretation:  Sinus rhythm Confirmed by Thayer Jew [phone]) on 01/30/2018 5:25:23 AM   Radiology Dg Chest Portable 1 View  Result Date: 01/30/2018 CLINICAL DATA:  Increasing shortness of breath EXAM: PORTABLE CHEST 1 VIEW COMPARISON:  09/25/2017 FINDINGS: Mild hyperinflation. Mild diffuse increased interstitial opacity. Stable cardiomediastinal silhouette. No pneumothorax. Subsegmental atelectasis in the right perihilar region. IMPRESSION: Mild diffuse increased interstitial opacity, suspect acute interstitial inflammation or minimal edema on chronic interstitial changes. Electronically Signed   By: Donavan Foil M.D.   On: 01/30/2018 03:49    Procedures Procedures (including critical care time)  CRITICAL CARE Performed by: Merryl Hacker   Total critical care time: 30 minutes  Critical care time was exclusive of separately billable procedures and treating other patients.  Critical care was necessary to treat or prevent imminent or life-threatening deterioration.  Critical care was time spent personally by me on the  following activities: development of treatment plan with patient and/or surrogate as well as nursing, discussions with consultants, evaluation of patient's response to  treatment, examination of patient, obtaining history from patient or surrogate, ordering and performing treatments and interventions, ordering and review of laboratory studies, ordering and review of radiographic studies, pulse oximetry and re-evaluation of patient's condition.  Medications Ordered in ED Medications  methylPREDNISolone sodium succinate (SOLU-MEDROL) 125 mg/2 mL injection 125 mg (125 mg Intravenous Given 01/30/18 0434)  albuterol (PROVENTIL,VENTOLIN) solution continuous neb (15 mg/hr Nebulization Given 01/30/18 0409)  ipratropium (ATROVENT) nebulizer solution 0.5 mg (0.5 mg Nebulization Given 01/30/18 0409)  ipratropium-albuterol (DUONEB) 0.5-2.5 (3) MG/3ML nebulizer solution 3 mL (3 mLs Nebulization Given 01/30/18 0528)  ipratropium-albuterol (DUONEB) 0.5-2.5 (3) MG/3ML nebulizer solution 3 mL (3 mLs Nebulization Given 01/30/18 0867)     Initial Impression / Assessment and Plan / ED Course  I have reviewed the triage vital signs and the nursing notes.  Pertinent labs & imaging results that were available during my care of the patient were reviewed by me and considered in my medical decision making (see chart for details).  Clinical Course as of Jan 31 708  Wed Jan 30, 2018  0524 On recheck, patient only minimally improved after continuous DuoNeb.  Continues to have wheezing with poor air movement.  Will repeat DuoNeb and reassess.  Patient may need admission given continued symptoms.   [CH]  0601 Patient feels improved.  Continues to wheeze but better airation.  On home O2.  Wishes to go home if possible.  Additional duoneb ordered and will reassess.   [CH]    Clinical Course User Index [CH] Ryley Teater, Barbette Hair, MD    She presents for shortness of breath.  Reports recent cough and increasing shortness of breath over  the last several weeks.  He is on his home oxygen satting 98%.  He is mildly tachypneic with expiratory wheezing in all lung fields.  Patient was given Solu-Medrol.  Continuous DuoNeb was started.  See clinical course above.  7:10 AM On last recheck, patient states he continues to improve after a third DuoNeb.  He does continue to have some wheezing but better aeration.  Patient ambulated without significant respiratory distress.  He is on his home oxygen requirement.  Patient wishes to be discharged home.  Will discharge with presumed COPD exacerbation.  Discharged with prednisone, doxycycline,.  Patient reports that he has a nebulizer at home.  Recommend continued every 4 hours nebulizer treatments.  After history, exam, and medical workup I feel the patient has been appropriately medically screened and is safe for discharge home. Pertinent diagnoses were discussed with the patient. Patient was given return precautions.   Final Clinical Impressions(s) / ED Diagnoses   Final diagnoses:  COPD exacerbation [facility])    ED Discharge Orders        Ordered    predniSONE (DELTASONE) 20 MG tablet  Daily     01/30/18 0708    doxycycline (VIBRAMYCIN) 100 MG capsule  2 times daily     01/30/18 0708       Jahira Swiss, Barbette Hair, MD 01/30/18 [phone]

## 2018-01-30 NOTE — ED Notes (Signed)
Pt ambulated around nurses' station twice without any difficulty.

## 2018-01-30 NOTE — Discharge Instructions (Addendum)
Seen today for shortness of breath.  This likely is caused by COPD exacerbation.  Take medications as prescribed.  If he develop worsening shortness of breath, you should be reevaluated.  Continue nebulizers at home every 4 hours.

## 2018-01-30 NOTE — ED Notes (Signed)
Neb complete, [place] 3L placed back on pt at this time

## 2018-02-01 ENCOUNTER — Encounter: Payer: Self-pay | Admitting: Family Medicine

## 2018-02-01 ENCOUNTER — Other Ambulatory Visit: Payer: Self-pay

## 2018-02-01 ENCOUNTER — Encounter (HOSPITAL_COMMUNITY): Payer: Self-pay | Admitting: Emergency Medicine

## 2018-02-01 ENCOUNTER — Emergency Department (HOSPITAL_COMMUNITY): Payer: Medicare Other

## 2018-02-01 ENCOUNTER — Inpatient Hospital Stay (HOSPITAL_COMMUNITY)
Admission: EM | Admit: 2018-02-01 | Discharge: 2018-02-04 | DRG: 193 | Disposition: A | Discharge status: Home Health | Payer: Medicare Other | Attending: Internal Medicine | Admitting: Internal Medicine

## 2018-02-01 ENCOUNTER — Ambulatory Visit (INDEPENDENT_AMBULATORY_CARE_PROVIDER_SITE_OTHER): Payer: Medicare Other | Admitting: Family Medicine

## 2018-02-01 VITALS — BP 152/60 | HR 86 | Temp 97.9°F | Resp 18 | Wt 181.8 lb

## 2018-02-01 DIAGNOSIS — I482 Chronic atrial fibrillation: Secondary | ICD-10-CM | POA: Diagnosis not present

## 2018-02-01 DIAGNOSIS — Z9981 Dependence on supplemental oxygen: Secondary | ICD-10-CM

## 2018-02-01 DIAGNOSIS — E871 Hypo-osmolality and hyponatremia: Secondary | ICD-10-CM | POA: Diagnosis present

## 2018-02-01 DIAGNOSIS — E860 Dehydration: Secondary | ICD-10-CM | POA: Diagnosis not present

## 2018-02-01 DIAGNOSIS — I10 Essential (primary) hypertension: Secondary | ICD-10-CM | POA: Diagnosis present

## 2018-02-01 DIAGNOSIS — E1165 Type 2 diabetes mellitus with hyperglycemia: Secondary | ICD-10-CM | POA: Diagnosis present

## 2018-02-01 DIAGNOSIS — I480 Paroxysmal atrial fibrillation: Secondary | ICD-10-CM | POA: Diagnosis present

## 2018-02-01 DIAGNOSIS — N183 Chronic kidney disease, stage 3 (moderate): Secondary | ICD-10-CM | POA: Diagnosis not present

## 2018-02-01 DIAGNOSIS — I130 Hypertensive heart and chronic kidney disease with heart failure and stage 1 through stage 4 chronic kidney disease, or unspecified chronic kidney disease: Secondary | ICD-10-CM | POA: Diagnosis present

## 2018-02-01 DIAGNOSIS — N179 Acute kidney failure, unspecified: Secondary | ICD-10-CM | POA: Diagnosis not present

## 2018-02-01 DIAGNOSIS — J9610 Chronic respiratory failure, unspecified whether with hypoxia or hypercapnia: Secondary | ICD-10-CM | POA: Diagnosis present

## 2018-02-01 DIAGNOSIS — R05 Cough: Secondary | ICD-10-CM | POA: Diagnosis not present

## 2018-02-01 DIAGNOSIS — R7989 Other specified abnormal findings of blood chemistry: Secondary | ICD-10-CM | POA: Diagnosis not present

## 2018-02-01 DIAGNOSIS — I248 Other forms of acute ischemic heart disease: Secondary | ICD-10-CM | POA: Diagnosis present

## 2018-02-01 DIAGNOSIS — J029 Acute pharyngitis, unspecified: Secondary | ICD-10-CM | POA: Diagnosis not present

## 2018-02-01 DIAGNOSIS — IMO0002 Reserved for concepts with insufficient information to code with codable children: Secondary | ICD-10-CM | POA: Diagnosis present

## 2018-02-01 DIAGNOSIS — I4891 Unspecified atrial fibrillation: Secondary | ICD-10-CM | POA: Diagnosis present

## 2018-02-01 DIAGNOSIS — Z961 Presence of intraocular lens: Secondary | ICD-10-CM | POA: Diagnosis present

## 2018-02-01 DIAGNOSIS — J8410 Pulmonary fibrosis, unspecified: Secondary | ICD-10-CM | POA: Diagnosis not present

## 2018-02-01 DIAGNOSIS — K219 Gastro-esophageal reflux disease without esophagitis: Secondary | ICD-10-CM | POA: Diagnosis not present

## 2018-02-01 DIAGNOSIS — Z9842 Cataract extraction status, left eye: Secondary | ICD-10-CM | POA: Diagnosis not present

## 2018-02-01 DIAGNOSIS — Z7901 Long term (current) use of anticoagulants: Secondary | ICD-10-CM

## 2018-02-01 DIAGNOSIS — J181 Lobar pneumonia, unspecified organism: Secondary | ICD-10-CM | POA: Diagnosis not present

## 2018-02-01 DIAGNOSIS — J9621 Acute and chronic respiratory failure with hypoxia: Secondary | ICD-10-CM | POA: Diagnosis not present

## 2018-02-01 DIAGNOSIS — N400 Enlarged prostate without lower urinary tract symptoms: Secondary | ICD-10-CM | POA: Diagnosis not present

## 2018-02-01 DIAGNOSIS — J439 Emphysema, unspecified: Secondary | ICD-10-CM | POA: Diagnosis not present

## 2018-02-01 DIAGNOSIS — R778 Other specified abnormalities of plasma proteins: Secondary | ICD-10-CM

## 2018-02-01 DIAGNOSIS — J440 Chronic obstructive pulmonary disease with acute lower respiratory infection: Secondary | ICD-10-CM | POA: Diagnosis present

## 2018-02-01 DIAGNOSIS — Z87891 Personal history of nicotine dependence: Secondary | ICD-10-CM

## 2018-02-01 DIAGNOSIS — I5032 Chronic diastolic (congestive) heart failure: Secondary | ICD-10-CM | POA: Diagnosis not present

## 2018-02-01 DIAGNOSIS — Z9841 Cataract extraction status, right eye: Secondary | ICD-10-CM

## 2018-02-01 DIAGNOSIS — R739 Hyperglycemia, unspecified: Secondary | ICD-10-CM

## 2018-02-01 DIAGNOSIS — R0602 Shortness of breath: Secondary | ICD-10-CM

## 2018-02-01 DIAGNOSIS — R531 Weakness: Secondary | ICD-10-CM

## 2018-02-01 DIAGNOSIS — Z79899 Other long term (current) drug therapy: Secondary | ICD-10-CM

## 2018-02-01 DIAGNOSIS — J449 Chronic obstructive pulmonary disease, unspecified: Secondary | ICD-10-CM | POA: Diagnosis present

## 2018-02-01 DIAGNOSIS — E875 Hyperkalemia: Secondary | ICD-10-CM | POA: Diagnosis present

## 2018-02-01 DIAGNOSIS — E1100 Type 2 diabetes mellitus with hyperosmolarity without nonketotic hyperglycemic-hyperosmolar coma (NKHHC): Secondary | ICD-10-CM | POA: Diagnosis present

## 2018-02-01 DIAGNOSIS — J9611 Chronic respiratory failure with hypoxia: Secondary | ICD-10-CM | POA: Diagnosis not present

## 2018-02-01 DIAGNOSIS — Z888 Allergy status to other drugs, medicaments and biological substances status: Secondary | ICD-10-CM

## 2018-02-01 DIAGNOSIS — J441 Chronic obstructive pulmonary disease with (acute) exacerbation: Secondary | ICD-10-CM | POA: Diagnosis not present

## 2018-02-01 DIAGNOSIS — Z7952 Long term (current) use of systemic steroids: Secondary | ICD-10-CM

## 2018-02-01 DIAGNOSIS — E1122 Type 2 diabetes mellitus with diabetic chronic kidney disease: Secondary | ICD-10-CM | POA: Diagnosis present

## 2018-02-01 DIAGNOSIS — Z7951 Long term (current) use of inhaled steroids: Secondary | ICD-10-CM

## 2018-02-01 DIAGNOSIS — Z833 Family history of diabetes mellitus: Secondary | ICD-10-CM

## 2018-02-01 DIAGNOSIS — Z8249 Family history of ischemic heart disease and other diseases of the circulatory system: Secondary | ICD-10-CM

## 2018-02-01 DIAGNOSIS — Z794 Long term (current) use of insulin: Secondary | ICD-10-CM

## 2018-02-01 DIAGNOSIS — J189 Pneumonia, unspecified organism: Principal | ICD-10-CM | POA: Diagnosis present

## 2018-02-01 DIAGNOSIS — E785 Hyperlipidemia, unspecified: Secondary | ICD-10-CM | POA: Diagnosis present

## 2018-02-01 DIAGNOSIS — R0600 Dyspnea, unspecified: Secondary | ICD-10-CM | POA: Diagnosis present

## 2018-02-01 LAB — URINALYSIS, ROUTINE W REFLEX MICROSCOPIC
BACTERIA UA: NONE SEEN /HPF
BACTERIA UA: NONE SEEN
BILIRUBIN URINE: NEGATIVE
BILIRUBIN URINE: NEGATIVE
HGB URINE DIPSTICK: NEGATIVE
KETONES UR: NEGATIVE
KETONES UR: NEGATIVE mg/dL
LEUKOCYTES UA: NEGATIVE
LEUKOCYTES UA: NEGATIVE
NITRITE: NEGATIVE
Nitrite: NEGATIVE
PH: 5 (ref 5.0–8.0)
Protein, ur: NEGATIVE mg/dL
RBC / HPF: NONE SEEN /HPF (ref 0–2)
SQUAMOUS EPITHELIAL / LPF: NONE SEEN /HPF (ref <=5)
Specific Gravity, Urine: 1.01 (ref 1.001–1.03)
Specific Gravity, Urine: 1.013 (ref 1.005–1.030)
WBC UA: NONE SEEN /HPF (ref 0–5)
pH: 6.5 (ref 5.0–8.0)

## 2018-02-01 LAB — CBC WITH DIFFERENTIAL/PLATELET
BASOS ABS: 17 {cells}/uL (ref 0–200)
Basophils Relative: 0.1 %
EOS ABS: 0 {cells}/uL — AB (ref 15–500)
Eosinophils Relative: 0 %
HCT: 33.2 % — ABNORMAL LOW (ref 38.5–50.0)
Hemoglobin: 9.9 g/dL — ABNORMAL LOW (ref 13.2–17.1)
Lymphs Abs: 1107 cells/uL (ref 850–3900)
MCH: 22.4 pg — AB (ref 27.0–33.0)
MCHC: 29.8 g/dL — AB (ref 32.0–36.0)
MCV: 75.1 fL — AB (ref 80.0–100.0)
MONOS PCT: 6.7 %
MPV: 9.8 fL (ref 7.5–12.5)
NEUTROS PCT: 86.8 %
Neutro Abs: 15016 cells/uL — ABNORMAL HIGH (ref 1500–7800)
PLATELETS: 462 10*3/uL — AB (ref 140–400)
RBC: 4.42 10*6/uL (ref 4.20–5.80)
RDW: 16.7 % — ABNORMAL HIGH (ref 11.0–15.0)
TOTAL LYMPHOCYTE: 6.4 %
WBC: 17.3 10*3/uL — ABNORMAL HIGH (ref 3.8–10.8)
WBCMIX: 1159 {cells}/uL — AB (ref 200–950)

## 2018-02-01 LAB — CBC
HCT: 31.6 % — ABNORMAL LOW (ref 39.0–52.0)
Hemoglobin: 9.4 g/dL — ABNORMAL LOW (ref 13.0–17.0)
MCH: 22.9 pg — ABNORMAL LOW (ref 26.0–34.0)
MCHC: 29.7 g/dL — ABNORMAL LOW (ref 30.0–36.0)
MCV: 76.9 fL — ABNORMAL LOW (ref 78.0–100.0)
Platelets: 403 10*3/uL — ABNORMAL HIGH (ref 150–400)
RBC: 4.11 MIL/uL — AB (ref 4.22–5.81)
RDW: 18.7 % — AB (ref 11.5–15.5)
WBC: 13.7 10*3/uL — AB (ref 4.0–10.5)

## 2018-02-01 LAB — BASIC METABOLIC PANEL WITH GFR
BUN / CREAT RATIO: 46 (calc) — AB (ref 6–22)
BUN: 80 mg/dL — ABNORMAL HIGH (ref 7–25)
CO2: 32 mmol/L (ref 20–32)
CREATININE: 1.74 mg/dL — AB (ref 0.70–1.11)
Calcium: 9.6 mg/dL (ref 8.6–10.3)
Chloride: 87 mmol/L — ABNORMAL LOW (ref 98–110)
GFR, EST AFRICAN AMERICAN: 42 mL/min/{1.73_m2} — AB (ref >=60)
GFR, EST NON AFRICAN AMERICAN: 36 mL/min/{1.73_m2} — AB (ref >=60)
Glucose, Bld: 612 mg/dL (ref 65–99)
POTASSIUM: 5.6 mmol/L — AB (ref 3.5–5.3)
SODIUM: 128 mmol/L — AB (ref 135–146)

## 2018-02-01 LAB — CBG MONITORING, ED
Glucose-Capillary: >600 mg/dL (ref 65–99)
Glucose-Capillary: >600 mg/dL (ref 65–99)
Glucose-Capillary: >600 mg/dL (ref 65–99)

## 2018-02-01 LAB — BASIC METABOLIC PANEL
Anion gap: 15 (ref 5–15)
BUN: 84 mg/dL — AB (ref 6–20)
CO2: 26 mmol/L (ref 22–32)
CREATININE: 2.06 mg/dL — AB (ref 0.61–1.24)
Calcium: 8.8 mg/dL — ABNORMAL LOW (ref 8.9–10.3)
Chloride: 84 mmol/L — ABNORMAL LOW (ref 101–111)
GFR, EST AFRICAN AMERICAN: 33 mL/min — AB (ref >60)
GFR, EST NON AFRICAN AMERICAN: 29 mL/min — AB (ref >60)
Glucose, Bld: 848 mg/dL (ref 65–99)
Potassium: 5.8 mmol/L — ABNORMAL HIGH (ref 3.5–5.1)
SODIUM: 125 mmol/L — AB (ref 135–145)

## 2018-02-01 LAB — INFLUENZA A AND B AG, IMMUNOASSAY
INFLUENZA A ANTIGEN: NOT DETECTED
INFLUENZA B ANTIGEN: NOT DETECTED

## 2018-02-01 LAB — TEST AUTHORIZATION

## 2018-02-01 LAB — GLUCOSE, CAPILLARY: Glucose-Capillary: 576 mg/dL (ref 65–99)

## 2018-02-01 LAB — STREP GROUP A AG, W/REFLEX TO CULT: STREPTOCOCCUS, GROUP A SCREEN (DIRECT): NOT DETECTED

## 2018-02-01 LAB — GLUCOSE 16585: Glucose: >444 mg/dL — ABNORMAL HIGH (ref 65–99)

## 2018-02-01 LAB — TROPONIN I: TROPONIN I: 0.22 ng/mL — AB (ref <0.03)

## 2018-02-01 LAB — BRAIN NATRIURETIC PEPTIDE: B Natriuretic Peptide: 202 pg/mL — ABNORMAL HIGH (ref 0.0–100.0)

## 2018-02-01 LAB — MICROSCOPIC MESSAGE

## 2018-02-01 MED ORDER — METOPROLOL TARTRATE 25 MG PO TABS
25.0000 mg | ORAL_TABLET | Freq: Two times a day (BID) | ORAL | Status: DC
  Administered 2018-02-01 – 2018-02-04 (×6): 25 mg via ORAL
  Filled 2018-02-01 (×6): qty 1

## 2018-02-01 MED ORDER — DEXTROSE 50 % IV SOLN: 25.0000 mL | INTRAVENOUS | Status: DC | PRN

## 2018-02-01 MED ORDER — INSULIN REGULAR BOLUS VIA INFUSION
0.0000 [IU] | Freq: Three times a day (TID) | INTRAVENOUS | Status: DC
  Administered 2018-02-02: 4.5 [IU] via INTRAVENOUS
  Administered 2018-02-02: 3 [IU] via INTRAVENOUS
  Filled 2018-02-01: qty 10

## 2018-02-01 MED ORDER — ACETAMINOPHEN 325 MG PO TABS: 650.0000 mg | ORAL_TABLET | Freq: Four times a day (QID) | ORAL | Status: DC | PRN

## 2018-02-01 MED ORDER — PRAVASTATIN SODIUM 40 MG PO TABS
80.0000 mg | ORAL_TABLET | Freq: Every day | ORAL | Status: DC
  Administered 2018-02-01 – 2018-02-03 (×3): 80 mg via ORAL
  Filled 2018-02-01 (×3): qty 2

## 2018-02-01 MED ORDER — ACETAMINOPHEN 650 MG RE SUPP: 650.0000 mg | Freq: Four times a day (QID) | RECTAL | Status: DC | PRN

## 2018-02-01 MED ORDER — IPRATROPIUM-ALBUTEROL 0.5-2.5 (3) MG/3ML IN SOLN
2.5000 mg | Freq: Four times a day (QID) | RESPIRATORY_TRACT | Status: DC
  Administered 2018-02-01: 3 mg via RESPIRATORY_TRACT
  Administered 2018-02-02 (×3): 2.5 mg via RESPIRATORY_TRACT
  Filled 2018-02-01 (×4): qty 3

## 2018-02-01 MED ORDER — APIXABAN 2.5 MG PO TABS
2.5000 mg | ORAL_TABLET | Freq: Two times a day (BID) | ORAL | Status: DC
  Administered 2018-02-01 – 2018-02-04 (×6): 2.5 mg via ORAL
  Filled 2018-02-01 (×6): qty 1

## 2018-02-01 MED ORDER — PANTOPRAZOLE SODIUM 40 MG PO TBEC
40.0000 mg | DELAYED_RELEASE_TABLET | Freq: Every day | ORAL | Status: DC
  Administered 2018-02-02 – 2018-02-04 (×3): 40 mg via ORAL
  Filled 2018-02-01 (×3): qty 1

## 2018-02-01 MED ORDER — IPRATROPIUM BROMIDE 0.02 % IN SOLN: 0.5000 mg | Freq: Four times a day (QID) | RESPIRATORY_TRACT | Status: DC

## 2018-02-01 MED ORDER — ALBUTEROL SULFATE (2.5 MG/3ML) 0.083% IN NEBU
2.5000 mg | INHALATION_SOLUTION | Freq: Once | RESPIRATORY_TRACT | Status: AC
  Administered 2018-02-01: 2.5 mg via RESPIRATORY_TRACT

## 2018-02-01 MED ORDER — ONDANSETRON HCL 4 MG PO TABS: 4.0000 mg | ORAL_TABLET | Freq: Four times a day (QID) | ORAL | Status: DC | PRN

## 2018-02-01 MED ORDER — DEXTROSE-NACL 5-0.45 % IV SOLN: INTRAVENOUS | Status: DC

## 2018-02-01 MED ORDER — LEVOFLOXACIN IN D5W 500 MG/100ML IV SOLN
500.0000 mg | Freq: Once | INTRAVENOUS | Status: AC
  Administered 2018-02-01: 500 mg via INTRAVENOUS
  Filled 2018-02-01: qty 100

## 2018-02-01 MED ORDER — IPRATROPIUM-ALBUTEROL 0.5-2.5 (3) MG/3ML IN SOLN
3.0000 mL | Freq: Once | RESPIRATORY_TRACT | Status: AC
  Administered 2018-02-01: 3 mL via RESPIRATORY_TRACT

## 2018-02-01 MED ORDER — SODIUM CHLORIDE 0.9 % IV BOLUS
500.0000 mL | Freq: Once | INTRAVENOUS | Status: AC
Start: 2018-02-01 — End: 2018-02-01
  Administered 2018-02-01: 500 mL via INTRAVENOUS

## 2018-02-01 MED ORDER — SODIUM CHLORIDE 0.9 % IV SOLN
INTRAVENOUS | Status: DC
  Administered 2018-02-01 – 2018-02-03 (×3): via INTRAVENOUS

## 2018-02-01 MED ORDER — IPRATROPIUM-ALBUTEROL 0.5-2.5 (3) MG/3ML IN SOLN
3.0000 mL | Freq: Once | RESPIRATORY_TRACT | Status: AC
  Administered 2018-02-01: 3 mL via RESPIRATORY_TRACT
  Filled 2018-02-01: qty 3

## 2018-02-01 MED ORDER — ORAL CARE MOUTH RINSE
15.0000 mL | Freq: Two times a day (BID) | OROMUCOSAL | Status: DC
  Administered 2018-02-02 – 2018-02-04 (×4): 15 mL via OROMUCOSAL

## 2018-02-01 MED ORDER — LEVOFLOXACIN 750 MG PO TABS
750.0000 mg | ORAL_TABLET | Freq: Every day | ORAL | 0 refills | Status: DC
Start: 2018-02-01 — End: 2018-02-01

## 2018-02-01 MED ORDER — DILTIAZEM HCL ER COATED BEADS 240 MG PO CP24
240.0000 mg | ORAL_CAPSULE | Freq: Every day | ORAL | Status: DC
  Administered 2018-02-02 – 2018-02-04 (×3): 240 mg via ORAL
  Filled 2018-02-01 (×3): qty 1

## 2018-02-01 MED ORDER — SODIUM CHLORIDE 0.9 % IV SOLN
INTRAVENOUS | Status: DC
  Administered 2018-02-01: 5.4 [IU]/h via INTRAVENOUS
  Administered 2018-02-02: 13.7 [IU]/h via INTRAVENOUS
  Administered 2018-02-02: 3 [IU]/h via INTRAVENOUS
  Administered 2018-02-02: 27.4 [IU]/h via INTRAVENOUS
  Filled 2018-02-01 (×5): qty 1

## 2018-02-01 MED ORDER — METHYLPREDNISOLONE SODIUM SUCC 125 MG IJ SOLR
125.0000 mg | Freq: Once | INTRAMUSCULAR | Status: AC
  Administered 2018-02-01: 125 mg via INTRAVENOUS
  Filled 2018-02-01: qty 2

## 2018-02-01 MED ORDER — ONDANSETRON HCL 4 MG/2ML IJ SOLN: 4.0000 mg | Freq: Four times a day (QID) | INTRAMUSCULAR | Status: DC | PRN

## 2018-02-01 MED ORDER — METHYLPREDNISOLONE SODIUM SUCC 125 MG IJ SOLR
60.0000 mg | Freq: Four times a day (QID) | INTRAMUSCULAR | Status: DC
  Administered 2018-02-01 – 2018-02-02 (×2): 60 mg via INTRAVENOUS
  Filled 2018-02-01 (×2): qty 2

## 2018-02-01 MED ORDER — ALBUTEROL SULFATE (2.5 MG/3ML) 0.083% IN NEBU
2.5000 mg | INHALATION_SOLUTION | Freq: Four times a day (QID) | RESPIRATORY_TRACT | Status: DC
  Administered 2018-02-01: 2.5 mg via RESPIRATORY_TRACT

## 2018-02-01 MED ORDER — LEVOFLOXACIN 750 MG PO TABS: 750.0000 mg | ORAL_TABLET | Freq: Every day | ORAL | 0 refills | Status: DC

## 2018-02-01 MED ORDER — CLONIDINE HCL 0.1 MG PO TABS
0.1000 mg | ORAL_TABLET | Freq: Two times a day (BID) | ORAL | Status: DC
  Administered 2018-02-01 – 2018-02-04 (×6): 0.1 mg via ORAL
  Filled 2018-02-01 (×6): qty 1

## 2018-02-01 MED ORDER — ALBUTEROL SULFATE (2.5 MG/3ML) 0.083% IN NEBU
2.5000 mg | INHALATION_SOLUTION | Freq: Once | RESPIRATORY_TRACT | Status: AC
  Administered 2018-02-01: 2.5 mg via RESPIRATORY_TRACT
  Filled 2018-02-01: qty 3

## 2018-02-01 NOTE — ED Notes (Signed)
Patient transported to X-ray 

## 2018-02-01 NOTE — ED Notes (Signed)
Date and time results received: 02/01/18 1907   Test: Troponin Critical Value: 0.22  Name of Provider Notified: Lita Mains, MD

## 2018-02-01 NOTE — Progress Notes (Signed)
Glucose too high to read our glucometer, stat BMP sent, pt refuses to go to the hospital for eval/labs

## 2018-02-01 NOTE — ED Notes (Signed)
Date and time results received: 02/01/18 1945  Test: CBG Critical Value: >600. Lab called to report Glucose: 848  Name of Provider Notified: Lita Mains, MD

## 2018-02-01 NOTE — ED Triage Notes (Signed)
Patient c/o shortness of breath with cough and sore throat. Denies any fevers. Cough nonproductive. Patient has audible wheezing, hx of COPD in which he uses neb treatments and wears 3 litters of O2 via [place] at all time. Patient reports using neb treatments "all day today" with no relief. Patient states seen here in ED on Wednesday and given antibiotic and prednisone. Patient told to stop prednisone by PCP. Patient sent today to EDP by PCP for kidney failure, hyperglycemia, and hyperkalemia.

## 2018-02-01 NOTE — Progress Notes (Signed)
Patient ID: Edward [name], male    DOB: [birth year], 83 y.o.   MRN: [id number]  PCP: Orlena Sheldon, PA-C  Chief Complaint  Patient presents with  . Shortness of Breath    started after he left hospital   . Sore Throat  . Headache  . Dry mouth    Subjective:   Edward [name] is a 83 y.o. male, with history of COPD, pulmonary fibrosis, chronic respiratory failure on 3 L oxygen by nasal cannula, uncontrolled insulin-dependent type 2 diabetes, CKD stage III, hypertension, A. fib, CHF, presents to clinic after ER visit 2 days ago for COPD exacerbation, presents with worsening symptoms and most bothersome to him is severe sore throat, weakness and fatigue.  He continues to have gradually worsening nasal and chest congestion, dry cough, shortness of breath with wheeze.  When triaged by the nurse he stated that shortness of breath started after he left the hospital however that was his reason for going into the hospital in the first place and per reviewing the records he did leave pretty much against the advice of the ER physician after stating that he did not want to stay and after walking around the ER twice without any respiratory distress. ER administered multiple breathing treatments and started steroids and doxycycline.  He states he has been taking 40 mg of prednisone every day for the past several days.  He has uncontrolled diabetes and is insulin-dependent however he is not checking his sugars and is very unclear if he is even administering his insulin.  Patient also states that he does nebulizer treatments but he does not know what medication he puts into them and is not doing any other maintenance inhalers including his Pulmicort.  He does confirm that he is taking Lasix every day he denies any change to his appetite or urination.    Patient is a very difficult historian.  After reviewing his medicines again with him and on asking him more questions about his sugars he states that the  machine is still not working and is not giving him a reading.  His son was brought into the room and asked the same questions to which the son stated that he has checked his sugar many times this morning and it read high but would not give a number.  Patient son states that he does check his sugars about twice a day but it has been reading high for several days.  Patient's son also states that his breathing has been generally worse for several months and that he is using oxygen all the time and he used to use it only occasionally.  Reviewing his last progress note and primary care 12/12/2017 it states stable COPD, he continues to have documentation of not understanding his maintenance medicines for COPD, I do not see a prior history of O2 requirement at rest.  Patient has nasal congestion, nasal discharge, nasal irritation with using nasal cannula.  His sore throat is severe, very painful, constant for the past 2 days, worse with swallowing.  He is avoiding foods because of pain and cold water makes it feel better.  No other alleviating or aggravating factors.  States that he is otherwise eating and drinking normally and paying a normal amount he denies any increased urinary frequency, urgency, urinary volume, nocturia, polydipsia.  He denies fever, chills, sweats, chest pain, orthopnea, lower extremity edema.  Patient son denies any altered mental status, confusion, problems with his balance.  Patient states  that currently walking down the hall and walking across 1 of the rooms in his house makes him very fatigued and tired and he is resting a lot more than usual.  No orthopnea, PND, palpitations, syncope.       Patient Active Problem List   Diagnosis Date Noted  . Acute encephalopathy 09/25/2017  . Acute confusion 09/25/2017  . On home O2 09/25/2017  . Bilateral lower extremity edema 06/27/2017  . Insomnia 03/28/2017  . Uncontrolled type 2 diabetes mellitus with hyperglycemia, with long-term current  use of insulin ([place]) 03/07/2017  . CKD (chronic kidney disease), stage III ([place]) 03/07/2017  . Palliative care encounter   . Goals of care, counseling/discussion   . Encounter for hospice care discussion   . AF (paroxysmal atrial fibrillation) ([place]) 03/05/2017  . Chronic diastolic CHF (congestive heart failure) ([place]) 03/04/2017  . Chronic respiratory failure ([place]) 03/04/2017  . Atrial fibrillation with rapid ventricular response ([place])   . Chronic diastolic heart failure ([place])   . Constipation 02/25/2017  . Overflow diarrhea/Constipation 02/25/2017  . Diabetes mellitus type 2, uncontrolled, without complications ([place]) 09/32/3557  . Non compliance w medication regimen 12/02/2015  . Hypercholesterolemia 05/12/2014  . Elevated LFTs 05/12/2014  . Foot laceration 05/12/2014  . Dyspnea 12/19/2013  . COPD exacerbation ([place]) 11/15/2013  . Atrial fibrillation ([place]) 11/10/2013  . Elevated PSA 01/20/2013  . Diabetes mellitus type 2, uncontrolled ([place])   . COPD (chronic obstructive pulmonary disease) ([place])   . Hypertension   . Allergy   . Elevated lipids   . Pulmonary fibrosis ([place])   . Bronchitis   . Colon polyps   . Colon polyps      Prior to Admission medications   Medication Sig Start Date End Date Taking? Authorizing Provider  albuterol (PROVENTIL) (2.5 MG/3ML) 0.083% nebulizer solution INHALE 1 VIAL VIA NEBULIZER EVERY 6 HOURS AS NEEDED FOR WHEEZING OR SHORTNESS OF BREATH 07/19/17  Yes Orlena Sheldon, PA-C  apixaban (ELIQUIS) 2.5 MG TABS tablet Take 1 tablet (2.5 mg total) by mouth 2 (two) times daily. 08/30/17  Yes Dena Billet B, PA-C  budesonide (PULMICORT) 0.5 MG/2ML nebulizer solution Take 2 mLs (0.5 mg total) by nebulization 2 (two) times daily. 07/19/17  Yes Dena Billet B, PA-C  cloNIDine (CATAPRES) 0.1 MG tablet TAKE 1 TABLET BY MOUTH 2 TIMES A DAY 01/28/18  Yes Dena Billet B, PA-C  diltiazem (CARDIZEM CD) 240 MG 24 hr capsule TAKE 1 CAPSULE BY MOUTH DAILY 12/03/17  Yes Dena Billet  B, PA-C  doxycycline (VIBRAMYCIN) 100 MG capsule Take 1 capsule (100 mg total) by mouth 2 (two) times daily. 01/30/18  Yes Horton, Barbette Hair, MD  furosemide (LASIX) 40 MG tablet Take 1 tablet (40 mg total) by mouth every other day. 09/26/17 09/26/18 Yes Johnson, Clanford L, MD  insulin NPH Human (NOVOLIN N RELION) 100 UNIT/ML injection Inject 0.07 mLs (7 Units total) into the skin 2 (two) times daily before a meal. 09/26/17  Yes Johnson, Clanford L, MD  ipratropium (ATROVENT) 0.02 % nebulizer solution Take 2.5 mLs (0.5 mg total) by nebulization 4 (four) times daily. 07/19/17  Yes Dena Billet B, PA-C  losartan (COZAAR) 100 MG tablet TAKE 1 TABLET BY MOUTH DAILY 12/31/17  Yes Dena Billet B, PA-C  metoprolol tartrate (LOPRESSOR) 25 MG tablet TAKE 1 TABLET BY MOUTH TWICE DAILY 12/31/17  Yes Dixon, Mary B, PA-C  NOVOLIN N RELION 100 UNIT/ML injection  INJECT 30 UNITS SUBCUTANEOUSLY WITH BREAKFAST AND SUPPER 01/07/18  Yes Dixon,  Lonie Peak, PA-C  omeprazole (PRILOSEC) 20 MG capsule Take 1 capsule (20 mg total) by mouth daily. 03/23/16  Yes Dena Billet B, PA-C  ONE TOUCH ULTRA TEST test strip CHECK FASTING BLOOD SUGAR TWICE DAILY 12/03/17  Yes Dena Billet B, PA-C  OXYGEN Inhale 2 L into the lungs daily.   Yes [provider]  potassium chloride SA (K-DUR,KLOR-CON) 20 MEQ tablet Take 1 tablet (20 mEq total) by mouth every other day. Take with lasix. 09/26/17  Yes Johnson, Clanford L, MD  potassium chloride SA (K-DUR,KLOR-CON) 20 MEQ tablet TAKE 2 TABLETS BY MOUTH THREE TIMES A DAY 11/06/17  Yes Dena Billet B, PA-C  pravastatin (PRAVACHOL) 80 MG tablet TAKE 1 TABLET BY MOUTH AT BEDTIME 01/28/18  Yes Dixon, Mary B, PA-C  predniSONE (DELTASONE) 20 MG tablet Take 2 tablets (40 mg total) by mouth daily. 01/30/18  Yes Horton, Barbette Hair, MD     Allergies  Allergen Reactions  . Ace Inhibitors Other (See Comments)    Hyperkalemia--07/23/2013:patient states not familiar with the following allergy     Family History    Problem Relation Age of Onset  . Heart disease Mother   . CAD Other   . Diabetes Other      Social History   Socioeconomic History  . Marital status: Married    Spouse name: Not on file  . Number of children: Not on file  . Years of education: Not on file  . Highest education level: Not on file  Occupational History  . 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 Financial resource strain: Not on file  . Food insecurity:    Worry: Not on file    Inability: Not on file  . Transportation needs:    Medical: Not on file    Non-medical: Not on file  Tobacco Use  . Smoking status: Former Smoker    Packs/day: 1.50    Years: 60.00    Pack years: 90.00    Types: Cigarettes    Last attempt to quit: 12/31/2012    Years since quitting: 5.0  . Smokeless tobacco: Never Used  Substance and Sexual Activity  . Alcohol use: No  . Drug use: No  . Sexual activity: Yes    Birth control/protection: None  Lifestyle  . Physical activity:    Days per week: Not on file    Minutes per session: Not on file  . Stress: Not on file  Relationships  . Social connections:    Talks on phone: Not on file    Gets together: Not on file    Attends religious service: Not on file    Active member of club or organization: Not on file    Attends meetings of clubs or organizations: Not on file    Relationship status: Not on file  . Intimate partner violence:    Fear of current or ex partner: Not on file    Emotionally abused: Not on file    Physically abused: Not on file    Forced sexual activity: Not on file  Other Topics Concern  . Not on file  Social History Narrative  . Not on file     Review of Systems  Constitutional: Positive for activity change and fatigue. Negative for appetite change, chills, diaphoresis, fever and unexpected weight change.  HENT: Positive for congestion, postnasal drip, rhinorrhea, sinus pressure and sore throat. Negative for trouble  swallowing and voice change.   Eyes: Negative.  Respiratory: Positive for cough, chest tightness, shortness of breath and wheezing. Negative for apnea, choking and stridor.   Cardiovascular: Negative.  Negative for chest pain, palpitations and leg swelling.  Gastrointestinal: Negative.   Endocrine: Negative.  Negative for polydipsia, polyphagia and polyuria.  Genitourinary: Negative.  Negative for decreased urine volume, difficulty urinating, discharge, dysuria, flank pain, frequency, genital sores, hematuria, penile pain, penile swelling, scrotal swelling, testicular pain and urgency.  Musculoskeletal: Negative.   Skin: Negative.  Negative for color change, pallor and rash.  Neurological: Positive for weakness. Negative for dizziness, syncope, facial asymmetry, light-headedness, numbness and headaches.  Hematological: Negative.   Psychiatric/Behavioral: Negative.  Negative for confusion.  All other systems reviewed and are negative.      Objective:    Vitals:   02/01/18 1028 02/01/18 1210  BP: 140/60 (!) 152/60  Pulse: 79 86  Resp: 18   Temp: 97.9 F (36.6 C)   TempSrc: Oral   SpO2: 97% 93%  Weight: 181 lb 12.8 oz (82.5 kg)       Physical Exam  Constitutional: He is oriented to person, place, and time. He appears well-developed and well-nourished.  Non-toxic appearance. He appears ill. He appears distressed.  Chronically ill-appearing male, appears tired and congested, obese, seated in chair in exam room, alert, no acute distress  HENT:  Head: Normocephalic and atraumatic.  Right Ear: Tympanic membrane, external ear and ear canal normal.  Left Ear: Tympanic membrane, external ear and ear canal normal.  Nose: No mucosal edema or rhinorrhea. Right sinus exhibits no maxillary sinus tenderness and no frontal sinus tenderness. Left sinus exhibits no maxillary sinus tenderness and no frontal sinus tenderness.  Mouth/Throat: Uvula is midline. No trismus in the jaw. No uvula  swelling. No oropharyngeal exudate, posterior oropharyngeal edema or posterior oropharyngeal erythema.  Mucous membranes dry Postnasal drip in oropharynx, very erythematous and mildly edematous, no exudate, uvula midline, no stridor Nasal mucosa with profuse clear nasal discharge, erythematous nasal mucosa, dried blood to left nare but no active epistaxis  Eyes: Pupils are equal, round, and reactive to light. Conjunctivae, EOM and lids are normal. Right eye exhibits no discharge. Left eye exhibits no discharge. No scleral icterus.  Neck: Trachea normal, normal range of motion and phonation normal. Neck supple. No JVD present. No tracheal deviation present.  Cardiovascular: Normal rate, regular rhythm, normal heart sounds, intact distal pulses and normal pulses. Exam reveals no gallop and no friction rub.  No murmur heard. Pulses:      Radial pulses are 2+ on the right side, and 2+ on the left side.       Posterior tibial pulses are 2+ on the right side, and 2+ on the left side.  No JVD, no lower extremity edema  Pulmonary/Chest: He has wheezes. He has no rhonchi. He has no rales. He exhibits no tenderness.  Tachypneic, able to speak in short sentences, no retractions or accessory muscle use.  On 3 L oxygen via nasal cannula.  Congestion and cough.  Tight inspiratory and expiratory wheezes auscultated posteriorly and anteriorly in all lung fields.  No crackles, no rhonchi  Abdominal: Soft. Normal appearance and bowel sounds are normal. He exhibits no distension. There is no tenderness. There is no rebound and no guarding.  Musculoskeletal: Normal range of motion. He exhibits no edema.  Neurological: He is alert and oriented to person, place, and time. Gait normal.  Skin: Skin is warm, dry and intact. Capillary refill takes less than 2 seconds. No rash noted.  Psychiatric: He has a normal mood and affect. His speech is normal and behavior is normal.  Nursing note and vitals reviewed.          Assessment & Plan:      ICD-10-CM   1. Sore throat J02.9 STREP GROUP A AG, W/REFLEX TO CULT    Influenza A and B Ag, Immunoassay    CBC with Differential    levofloxacin (LEVAQUIN) 750 MG tablet    DISCONTINUED: levofloxacin (LEVAQUIN) 750 MG tablet  2. Type 2 diabetes mellitus with hyperglycemia, without long-term current use of insulin ([name]) E11.65 GLUCOSE 52841    BASIC METABOLIC PANEL WITH GFR    CBC with Differential    Hemoglobin A1c    Urinalysis, Routine w reflex microscopic  3. Weakness R53.1 Glucose, fingerstick (stat)    GLUCOSE 32440    BASIC METABOLIC PANEL WITH GFR    CBC with Differential    Hemoglobin A1c    Urinalysis, Routine w reflex microscopic  4. Shortness of breath R06.02 ipratropium-albuterol (DUONEB) 0.5-2.5 (3) MG/3ML nebulizer solution 3 mL    albuterol (PROVENTIL) (2.5 MG/3ML) 0.083% nebulizer solution 2.5 mg    CBC with Differential  5. Acute exacerbation of chronic obstructive pulmonary disease (COPD) ([name]) J44.1 CBC with Differential    levofloxacin (LEVAQUIN) 750 MG tablet    DISCONTINUED: levofloxacin (LEVAQUIN) 750 MG tablet    Patient diagnosed with COPD exacerbation 2 days ago in the ER, wish to admit him but he refused and left, presents back with worsening breathing and new weakness   I have discussed with the patient and with his son at length that his blood sugars are highly elevated so that they are too high and unable to be read on our machines here in the office.  I advised him to go to the ER to get a quick work-up done so we know about his electrolytes his sugars to monitor him for short time and then can advise him how to fix them also to decide whether or not it is safe to fix them at home or if he should be in the hospital while he is continuing to treat his COPD exacerbation with steroids.  Patient adamantly refuses to go to the emergency room, his son is with him and understands the risks that I have described at length.  I have also  discussed with them that they need to get advance directives, have things documented about his wishes, and have a discussion right now about what is going to happen if he suddenly becomes unresponsive.  Son states that his sugars have been high for several days, his breathing has been much worse for several months, and he is not doing all of his prescribed breathing treatments but he is taking all of his other medications as prescribed.   Sore throat was evaluated with rapid flu and strep testing. He appears to have URI. Suspect his weakness is from hyperglycemia secondary to steroid use for acute on chronic respiratory failure. Patient is mildly tachypneic with inspiratory and expiratory wheeze that improved minimally with improved breath sounds, but persisting insp and exp wheeze.  He is on 3 L oxygen via nasal cannula and has been on this for some time, his son states that he is not on this chronically at his baseline (unclear if he is adding this on himself at home?).  He is able to walk short distances in the clinic without any worsening work of breathing, and w/o distress.Marland Kitchen  Heart rate  is 86, SpO2 92% on 3L O2 via [place].    Delsa Grana, PA-C 02/01/18 2:16 PM   5:13 PM I called pt and reviewed received labs with him and with his son and again urged him to go to the ER for hyperglycemia eval and tx.  He agrees to go.  His son will take him to [facility], called report to Education officer, environmental.    Calculated AG - 9 No ketones in urine on dip Pt A&O earlier in clinic and recently on the phone.

## 2018-02-01 NOTE — H&P (Signed)
TRH H&P    Patient Demographics:    Edward [name], is a 83 y.o. male  MRN: [id number]  DOB - [birth year]  Admit Date - 02/01/2018  Referring MD/NP/PA: Dr. Lita Mains  Outpatient Primary MD for the patient is Orlena Sheldon, PA-C  Patient coming from: Home  Chief complaint-shortness of breath   HPI:    Edward [name]  is a 83 y.o. male, with history of COPD, chronic respiratory failure on 3 L oxygen by nasal cannula, pulmonary fibrosis, chronic diastolic CHF, diabetes mellitus, chronic kidney disease stage III, hypertension, atrial fibrillation who was sent to ED from PCP office after patient was found to be short of breath along with hyperglycemia. Patient was seen in the ED 2 days ago and started on steroids with doxycycline. Today again he went to the PCP office for worsening shortness of breath along with cough, he was advised to come to ED for further evaluation.  Patient's O2 sats was in 70s without oxygen.  Patient is a poor historian He denies chest pain Denies abdominal pain, no nausea vomiting or diarrhea. Denies dysuria urgency or frequency of urination. Denies coughing up any phlegm. Denies history of stroke or seizures. No previous history of CAD Chest x-ray showed patchy infiltrate in the right midlung and left lower lobe suspicious for pneumonia.  Patient started on Levaquin.    Review of systems:      All other systems reviewed and are negative.   With Past History of the following :    Past Medical History:  Diagnosis Date  . Allergy    Rhinitis  . Bronchitis   . Chronic respiratory failure ([place])   . Colon polyps   . COPD (chronic obstructive pulmonary disease) ([place])   . Diabetes mellitus   . Elevated lipids   . Hypercholesterolemia   . Hypertension   . Noncompliance   . On home O2    2L N/C   . PSA elevation   . Pulmonary fibrosis ([place])   . Vitamin D deficiency       Past  Surgical History:  Procedure Laterality Date  . CATARACT EXTRACTION W/PHACO  06/25/2012   Procedure: CATARACT EXTRACTION PHACO AND INTRAOCULAR LENS PLACEMENT (IOC);  Surgeon: Elta Guadeloupe T. Gershon Crane, MD;  Location: [facility];  Service: Ophthalmology;  Laterality: Left;  CDE=19.01  . CATARACT EXTRACTION W/PHACO  07/09/2012   Procedure: CATARACT EXTRACTION PHACO AND INTRAOCULAR LENS PLACEMENT (IOC);  Surgeon: Elta Guadeloupe T. Gershon Crane, MD;  Location: [facility];  Service: Ophthalmology;  Laterality: Right;  CDE: 20.09      Social History:      Social History   Tobacco Use  . Smoking status: Former Smoker    Packs/day: 1.50    Years: 60.00    Pack years: 90.00    Types: Cigarettes    Last attempt to quit: 12/31/2012    Years since quitting: 5.0  . Smokeless tobacco: Never Used  Substance Use Topics  . Alcohol use: No       Family History :     Family  History  Problem Relation Age of Onset  . Heart disease Mother   . CAD Other   . Diabetes Other       Home Medications:   Prior to Admission medications   Medication Sig Start Date End Date Taking? Authorizing Provider  albuterol (PROVENTIL) (2.5 MG/3ML) 0.083% nebulizer solution INHALE 1 VIAL VIA NEBULIZER EVERY 6 HOURS AS NEEDED FOR WHEEZING OR SHORTNESS OF BREATH 07/19/17   Orlena Sheldon, PA-C  apixaban (ELIQUIS) 2.5 MG TABS tablet Take 1 tablet (2.5 mg total) by mouth 2 (two) times daily. 08/30/17   Dena Billet B, PA-C  budesonide (PULMICORT) 0.5 MG/2ML nebulizer solution Take 2 mLs (0.5 mg total) by nebulization 2 (two) times daily. 07/19/17   Dena Billet B, PA-C  cloNIDine (CATAPRES) 0.1 MG tablet TAKE 1 TABLET BY MOUTH 2 TIMES A DAY 01/28/18   Dena Billet B, PA-C  diltiazem (CARDIZEM CD) 240 MG 24 hr capsule TAKE 1 CAPSULE BY MOUTH DAILY 12/03/17   Orlena Sheldon, PA-C  furosemide (LASIX) 40 MG tablet Take 1 tablet (40 mg total) by mouth every other day. 09/26/17 09/26/18  Johnson, Clanford L, MD  insulin NPH Human (NOVOLIN N RELION) 100 UNIT/ML  injection Inject 0.07 mLs (7 Units total) into the skin 2 (two) times daily before a meal. 09/26/17   Johnson, Clanford L, MD  ipratropium (ATROVENT) 0.02 % nebulizer solution Take 2.5 mLs (0.5 mg total) by nebulization 4 (four) times daily. 07/19/17   Orlena Sheldon, PA-C  levofloxacin (LEVAQUIN) 750 MG tablet Take 1 tablet (750 mg total) by mouth daily for 5 days. 02/01/18 02/06/18  Delsa Grana, PA-C  losartan (COZAAR) 100 MG tablet TAKE 1 TABLET BY MOUTH DAILY 12/31/17   Dena Billet B, PA-C  metoprolol tartrate (LOPRESSOR) 25 MG tablet TAKE 1 TABLET BY MOUTH TWICE DAILY 12/31/17   Dena Billet B, PA-C  NOVOLIN N RELION 100 UNIT/ML injection  INJECT 30 UNITS SUBCUTANEOUSLY WITH BREAKFAST AND SUPPER 01/07/18   Orlena Sheldon, PA-C  omeprazole (PRILOSEC) 20 MG capsule Take 1 capsule (20 mg total) by mouth daily. 03/23/16   Orlena Sheldon, PA-C  ONE TOUCH ULTRA TEST test strip CHECK FASTING BLOOD SUGAR TWICE DAILY 12/03/17   Dena Billet B, PA-C  OXYGEN Inhale 2 L into the lungs daily.    [provider]  potassium chloride SA (K-DUR,KLOR-CON) 20 MEQ tablet Take 1 tablet (20 mEq total) by mouth every other day. Take with lasix. 09/26/17   Johnson, Clanford L, MD  potassium chloride SA (K-DUR,KLOR-CON) 20 MEQ tablet TAKE 2 TABLETS BY MOUTH THREE TIMES A DAY 11/06/17   Dena Billet B, PA-C  pravastatin (PRAVACHOL) 80 MG tablet TAKE 1 TABLET BY MOUTH AT BEDTIME 01/28/18   Dixon, Lonie Peak, PA-C  predniSONE (DELTASONE) 20 MG tablet Take 2 tablets (40 mg total) by mouth daily. 01/30/18   Horton, Barbette Hair, MD     Allergies:     Allergies  Allergen Reactions  . Ace Inhibitors Other (See Comments)    Hyperkalemia--07/23/2013:patient states not familiar with the following allergy     Physical Exam:   Vitals  Blood pressure (!) 148/73, pulse 95, temperature 98.6 F (37 C), temperature source Oral, resp. rate (!) 23, height 5\' 5"  (1.651 m), weight 82.1 kg (181 lb), SpO2 94 %.  1.  General: Appears in no acute  distress  2. Psychiatric:  Intact judgement and  insight, awake alert, oriented x 3.  3. Neurologic: No focal neurological deficits,  all cranial nerves intact.Strength 5/5 all 4 extremities, sensation intact all 4 extremities, plantars down going.  4. Eyes :  anicteric sclerae, moist conjunctivae with no lid lag. PERRLA.  5. ENMT:  Oropharynx clear with moist mucous membranes and good dentition  6. Neck:  supple, no cervical lymphadenopathy appriciated, No thyromegaly  7. Respiratory : Normal respiratory effort, decreased breath sounds bilaterally  8. Cardiovascular : RRR, no gallops, rubs or murmurs, trace edema of the lower extremities  9. Gastrointestinal:  Positive bowel sounds, abdomen soft, non-tender to palpation,no hepatosplenomegaly, no rigidity or guarding       10. Skin:  No cyanosis, normal texture and turgor, no rash, lesions or ulcers  11.Musculoskeletal:  Good muscle tone,  joints appear normal , no effusions,  normal range of motion    Data Review:    CBC Recent Labs  Lab 01/30/18 0343 02/01/18 1208 02/01/18 1808  WBC 7.6 17.3* 13.7*  HGB 9.8* 9.9* 9.4*  HCT 33.6* 33.2* 31.6*  PLT 374 462* 403*  MCV 75.2* 75.1* 76.9*  MCH 21.9* 22.4* 22.9*  MCHC 29.2* 29.8* 29.7*  RDW 17.6* 16.7* 18.7*  LYMPHSABS 1.6 1,107  --   MONOABS 0.9  --   --   EOSABS 1.0* 0*  --   BASOSABS 0.0 17  --    ------------------------------------------------------------------------------------------------------------------  Chemistries  Recent Labs  Lab 01/30/18 0343 02/01/18 1208 02/01/18 1808  NA 135 128* 125*  K 5.0 5.6* 5.8*  CL 94* 87* 84*  CO2 29 32 26  GLUCOSE 246* 612* 848*  BUN 58* 80* 84*  CREATININE 1.61* 1.74* 2.06*  CALCIUM 9.9 9.6 8.8*    ------------------------------------------------------------------------------------------------------------------  ------------------------------------------------------------------------------------------------------------------ GFR: Estimated Creatinine Clearance: 28.2 mL/min (A) (by C-G formula based on SCr of 2.06 mg/dL (H)). Liver Function Tests: No results for input(s): AST, ALT, ALKPHOS, BILITOT, PROT, ALBUMIN in the last 168 hours. No results for input(s): LIPASE, AMYLASE in the last 168 hours. No results for input(s): AMMONIA in the last 168 hours. Coagulation Profile: No results for input(s): INR, PROTIME in the last 168 hours. Cardiac Enzymes: Recent Labs  Lab 01/30/18 0343 02/01/18 1808  TROPONINI <0.03 0.22*   BNP (last 3 results) No results for input(s): PROBNP in the last 8760 hours. HbA1C: No results for input(s): HGBA1C in the last 72 hours. CBG: Recent Labs  Lab 02/01/18 1800 02/01/18 1924 02/01/18 2057  GLUCAP >600* >600* >600*   Lipid Profile: No results for input(s): CHOL, HDL, LDLCALC, TRIG, CHOLHDL, LDLDIRECT in the last 72 hours. Thyroid Function Tests: No results for input(s): TSH, T4TOTAL, FREET4, T3FREE, THYROIDAB in the last 72 hours. Anemia Panel: No results for input(s): VITAMINB12, FOLATE, FERRITIN, TIBC, IRON, RETICCTPCT in the last 72 hours.  --------------------------------------------------------------------------------------------------------------- Urine analysis:    Component Value Date/Time   COLORURINE STRAW (A) 02/01/2018 1811   APPEARANCEUR CLEAR 02/01/2018 1811   LABSPEC 1.013 02/01/2018 1811   PHURINE 5.0 02/01/2018 1811   GLUCOSEU >=500 (A) 02/01/2018 1811   HGBUR NEGATIVE 02/01/2018 1811   BILIRUBINUR NEGATIVE 02/01/2018 1811   KETONESUR NEGATIVE 02/01/2018 1811   PROTEINUR NEGATIVE 02/01/2018 1811   NITRITE NEGATIVE 02/01/2018 1811   LEUKOCYTESUR NEGATIVE 02/01/2018 1811      Imaging Results:    Dg Chest 2  View  Result Date: 02/01/2018 CLINICAL DATA:  Shortness of breath, nonproductive cough, wheezing EXAM: CHEST - 2 VIEW COMPARISON:  01/30/2018 FINDINGS: Mild patchy opacities in the right mid lung and left lower lobe, suspicious for pneumonia. No pleural effusion or  pneumothorax. The heart is normal in size. Visualized osseous structures are within normal limits. IMPRESSION: Mild patchy opacities in the right mid lung and left lower lobe, suspicious for pneumonia. Electronically Signed   By: Julian Hy M.D.   On: 02/01/2018 19:24    My personal review of EKG: Rhythm NSR, nonspecific ST changes   Assessment & Plan:    Active Problems:   Diabetes mellitus type 2, uncontrolled ([name])   COPD (chronic obstructive pulmonary disease) ([name])   Hypertension   Atrial fibrillation ([name])   Dyspnea   Chronic diastolic CHF (congestive heart failure) ([place])   Chronic respiratory failure ([place])   CAP (community acquired pneumonia)   1. Acute on chronic respiratory failure-multifactorial, patient has underlying COPD, pulmonary fibrosis.  Will start Solu-Medrol 60 mg IV every 6 hours, DuoNeb nebulizers every 6 hours.  Chest x-ray shows community-acquired pneumonia, patient has been started on IV Levaquin. 2. Community-acquired pneumonia-started on Levaquin as above will obtain blood cultures x2.   3. COPD exacerbation-started on Solu-Medrol, nebulizers as above.  Uncontrolled 4. Hyperglycemia versus early DKA - patient came with blood glucose of 848.  Anion gap of 15, bicarb is 26,  started on IV insulin, glucose stabilizer. Will monitor closely in step down unit.  Will repeat BMP now 5. Atrial fibrillation-heart rate is controlled, continue Cardizem, apixaban for anticoagulation 6. Chronic diastolic CHF-patient does not appear to be volume overload, but does have elevated troponin.  Will cycle troponin every 6 hours.  This is likely from demand ischemia.  Patient denies chest pain at this time.  EKG shows  nonspecific ST changes.  Will hold Lasix at this time. 7. Hypertension-blood pressure stable, continue Cardizem, Catapres, will hold Cozaar due to hyperkalemia 8. Hyponatremia-likely pseudohyponatremia in the setting of uncontrolled hyperglycemia.  Follow BMP in a.m. 9. Hyperkalemia-potassium was elevated, likely from potassium supplementation along with Cozaar at home.  Will hold Cozaar, potassium.  Also patient started on IV insulin.  Will check BMP now and follow BMP in a.m.    DVT Prophylaxis-   apixaban  AM Labs Ordered, also please review Full Orders  Family Communication: Admission, patients condition and plan of care including tests being ordered have been discussed with the patient  who indicate understanding and agree with the plan and Code Status.  Code Status:  Full code  Admission status: Inpatient  Time spent in minutes : 60 minutes   Oswald Hillock M.D on 02/01/2018 at 10:13 PM  Between 7am to 7pm - Pager - [phone]. After 7pm go to [email or web address] - password [facility]  [facility] - Office  [phone]

## 2018-02-01 NOTE — Patient Instructions (Signed)
Go to the ER for labs and evaluation of critically elevated CBG.   And also to evaluate your worsening symptoms.   Since I cannot get stat labs and it is Friday and I cannot recheck you in the next 1 or 2 days you make it much worse and unfortunately my medical recommendation is you need to go to the ER to get all this information fast so we can treat you appropriately.  Since you choose not to go to the ER there is a risk of mortality and morbidity, coma, altered mental status.  If sugars are very elevated because of the prednisone which were given which she did need for your lungs and you do continue to need for your lungs.  The safest way to treat you right now is to do it in the hospital where sugars can be controlled for you and breathing can be supported.  Also need to pulmonologist and you need to be taking your other inhalers to help prevent worsening breathing like this.    I have prescribed a different antibiotic for you that will cover your throat in your lungs you can get this and start taking it today.  I cannot advise you safely about how to adjust her insulin for your sugars because I do not know how high they are currently.   Hyperglycemia Hyperglycemia occurs when the level of sugar (glucose) in the blood is too high. Glucose is a type of sugar that provides the body's main source of energy. Certain hormones (insulin and glucagon) control the level of glucose in the blood. Insulin lowers blood glucose, and glucagon increases blood glucose. Hyperglycemia can result from having too little insulin in the bloodstream, or from the body not responding normally to insulin. Hyperglycemia occurs most often in people who have diabetes (diabetes mellitus), but it can happen in people who do not have diabetes. It can develop quickly, and it can be life-threatening if it causes you to become severely dehydrated (diabetic ketoacidosis or hyperglycemic hyperosmolar state). Severe hyperglycemia is a  medical emergency. What are the causes? If you have diabetes, hyperglycemia may be caused by:  Diabetes medicine.  Medicines that increase blood glucose or affect your diabetes control.  Not eating enough, or not eating often enough.  Changes in physical activity level.  Being sick or having an infection.  If you have prediabetes or undiagnosed diabetes:  Hyperglycemia may be caused by those conditions.  If you do not have diabetes, hyperglycemia may be caused by:  Certain medicines, including steroid medicines, beta-blockers, epinephrine, and thiazide diuretics.  Stress.  Serious illness.  Surgery.  Diseases of the pancreas.  Infection.  What increases the risk? Hyperglycemia is more likely to develop in people who have risk factors for diabetes, such as:  Having a family member with diabetes.  Having a gene for type 1 diabetes that is passed from parent to child (inherited).  Living in an area with cold weather conditions.  Exposure to certain viruses.  Certain conditions in which the body's disease-fighting (immune) system attacks itself (autoimmune disorders).  Being overweight or obese.  Having an inactive (sedentary) lifestyle.  Having been diagnosed with insulin resistance.  Having a history of prediabetes, gestational diabetes, or polycystic ovarian syndrome (PCOS).  Being of American-Indian, African-American, Hispanic/Latino, or Asian/Pacific Islander descent.  What are the signs or symptoms? Hyperglycemia may not cause any symptoms. If you do have symptoms, they may include early warning signs, such as:  Increased thirst.  Hunger.  Feeling very tired.  Needing to urinate more often than usual.  Blurry vision.  Other symptoms may develop if hyperglycemia gets worse, such as:  Dry mouth.  Loss of appetite.  Fruity-smelling breath.  Weakness.  Unexpected or rapid weight gain or weight loss.  Tingling or numbness in the hands or  feet.  Headache.  Skin that does not quickly return to normal after being lightly pinched and released (poor skin turgor).  Abdominal pain.  Cuts or bruises that are slow to heal.  How is this diagnosed? Hyperglycemia is diagnosed with a blood test to measure your blood glucose level. This blood test is usually done while you are having symptoms. Your health care provider may also do a physical exam and review your medical history. You may have more tests to determine the cause of your hyperglycemia, such as:  A fasting blood glucose (FBG) test. You will not be allowed to eat (you will fast) for at least 8 hours before a blood sample is taken.  An A1c (hemoglobin A1c) blood test. This provides information about blood glucose control over the previous 2-3 months.  An oral glucose tolerance test (OGTT). This measures your blood glucose at two times: ? After fasting. This is your baseline blood glucose level. ? Two hours after drinking a beverage that contains glucose.  How is this treated? Treatment depends on the cause of your hyperglycemia. Treatment may include:  Taking medicine to regulate your blood glucose levels. If you take insulin or other diabetes medicines, your medicine or dosage may be adjusted.  Lifestyle changes, such as exercising more, eating healthier foods, or losing weight.  Treating an illness or infection, if this caused your hyperglycemia.  Checking your blood glucose more often.  Stopping or reducing steroid medicines, if these caused your hyperglycemia.  If your hyperglycemia becomes severe and it results in hyperglycemic hyperosmolar state, you must be hospitalized and given IV fluids. Follow these instructions at home: General instructions  Take over-the-counter and prescription medicines only as told by your health care provider.  Do not use any products that contain nicotine or tobacco, such as cigarettes and e-cigarettes. If you need help quitting,  ask your health care provider.  Limit alcohol intake to no more than 1 drink per day for nonpregnant women and 2 drinks per day for men. One drink equals 12 oz of beer, 5 oz of wine, or 1 oz of hard liquor.  Learn to manage stress. If you need help with this, ask your health care provider.  Keep all follow-up visits as told by your health care provider. This is important. Eating and drinking  Maintain a healthy weight.  Exercise regularly, as directed by your health care provider.  Stay hydrated, especially when you exercise, get sick, or spend time in hot temperatures.  Eat healthy foods, such as: ? Lean proteins. ? Complex carbohydrates. ? Fresh fruits and vegetables. ? Low-fat dairy products. ? Healthy fats.  Drink enough fluid to keep your urine clear or pale yellow. If you have diabetes:   Make sure you know the symptoms of hyperglycemia.  Follow your diabetes management plan, as told by your health care provider. Make sure you: ? Take your insulin and medicines as directed. ? Follow your exercise plan. ? Follow your meal plan. Eat on time, and do not skip meals. ? Check your blood glucose as often as directed. Make sure to check your blood glucose before and after exercise. If you exercise longer or in  a different way than usual, check your blood glucose more often. ? Follow your sick day plan whenever you cannot eat or drink normally. Make this plan in advance with your health care provider.  Share your diabetes management plan with people in your workplace, school, and household.  Check your urine for ketones when you are ill and as told by your health care provider.  Carry a medical alert card or wear medical alert jewelry. Contact a health care provider if:  Your blood glucose is at or above 240 mg/dL (13.3 mmol/L) for 2 days in a row.  You have problems keeping your blood glucose in your target range.  You have frequent episodes of hyperglycemia. Get help  right away if:  You have difficulty breathing.  You have a change in how you think, feel, or act (mental status).  You have nausea or vomiting that does not go away. These symptoms may represent a serious problem that is an emergency. Do not wait to see if the symptoms will go away. Get medical help right away. Call your local emergency services (911 in the [place] Do not drive yourself to the hospital. Summary  Hyperglycemia occurs when the level of sugar (glucose) in the blood is too high.  Hyperglycemia is diagnosed with a blood test to measure your blood glucose level. This blood test is usually done while you are having symptoms. Your health care provider may also do a physical exam and review your medical history.  If you have diabetes, follow your diabetes management plan as told by your health care provider.  Contact your health care provider if you have problems keeping your blood glucose in your target range. This information is not intended to replace advice given to you by your health care provider. Make sure you discuss any questions you have with your health care provider. Document Released: 03/14/2001 Document Revised: 06/05/2016 Document Reviewed: 06/05/2016 Elsevier Interactive Patient Education  2018 [employer].   Chronic Obstructive Pulmonary Disease Chronic obstructive pulmonary disease (COPD) is a long-term (chronic) condition that affects the lungs. COPD is a general term that can be used to describe many different lung problems that cause lung swelling (inflammation) and limit airflow, including chronic bronchitis and emphysema. If you have COPD, your lung function will probably never return to normal. In most cases, it gets worse over time. However, there are steps you can take to slow the progression of the disease and improve your quality of life. What are the causes? This condition may be caused by:  Smoking. This is the most common cause.  Certain genes  passed down through families.  What increases the risk? The following factors may make you more likely to develop this condition:  Secondhand smoke from cigarettes, pipes, or cigars.  Exposure to chemicals and other irritants such as fumes and dust in the work environment.  Chronic lung conditions or infections.  What are the signs or symptoms? Symptoms of this condition include:  Shortness of breath, especially during physical activity.  Chronic cough with a large amount of thick mucus. Sometimes the cough may not have any mucus (dry cough).  Wheezing.  Rapid breaths.  Gray or bluish discoloration (cyanosis) of the skin, especially in your fingers, toes, or lips.  Feeling tired (fatigue).  Weight loss.  Chest tightness.  Frequent infections.  Episodes when breathing symptoms become much worse (exacerbations).  Swelling in the ankles, feet, or legs. This may occur in later stages of the disease.  How  is this diagnosed? This condition is diagnosed based on:  Your medical history.  A physical exam.  You may also have tests, including:  Lung (pulmonary) function tests. This may include a spirometry test, which measures your ability to exhale properly.  Chest X-ray.  CT scan.  Blood tests.  How is this treated? This condition may be treated with:  Medicines. These may include inhaled rescue medicines to treat acute exacerbations as well as long-term, or maintenance, medicines to prevent flare-ups of COPD. ? Bronchodilators help treat COPD by dilating the airways to allow increased airflow and make your breathing more comfortable. ? Steroids can reduce airway inflammation and help prevent exacerbations.  Smoking cessation. If you smoke, your health care provider may ask you to quit, and may also recommend therapy or replacement products to help you quit.  Pulmonary rehabilitation. This may involve working with a team of health care providers and specialists,  such as respiratory, occupational, and physical therapists.  Exercise and physical activity. These are beneficial for nearly all people with COPD.  Nutrition therapy to gain weight, if you are underweight.  Oxygen. Supplemental oxygen therapy is only helpful if you have a low oxygen level in your blood (hypoxemia).  Lung surgery or transplant.  Palliative care. This is to help people with COPD feel comfortable when treatment is no longer working.  Follow these instructions at home: Medicines  Take over-the-counter and prescription medicines (inhaled or pills) only as told by your health care provider.  Talk to your health care provider before taking any cough or allergy medicines. You may need to avoid certain medicines that dry out your airways. Lifestyle  If you are a smoker, the most important thing that you can do is to stop smoking. Do not use any products that contain nicotine or tobacco, such as cigarettes and e-cigarettes. If you need help quitting, ask your health care provider. Continuing to smoke will cause the disease to progress faster.  Avoid exposure to things that irritate your lungs, such as smoke, chemicals, and fumes.  Stay active, but balance activity with periods of rest. Exercise and physical activity will help you maintain your ability to do things you want to do.  Learn and use relaxation techniques to manage stress and to control your breathing.  Get the right amount of sleep and get quality sleep. Most adults need 7 or more hours per night.  Eat healthy foods. Eating smaller, more frequent meals and resting before meals may help you maintain your strength. Controlled breathing Learn and use controlled breathing techniques as directed by your health care provider. Controlled breathing techniques include:  Pursed lip breathing. Start by breathing in (inhaling) through your nose for 1 second. Then, purse your lips as if you were going to whistle and breathe  out (exhale) through the pursed lips for 2 seconds.  Diaphragmatic breathing. Start by putting one hand on your abdomen just above your waist. Inhale slowly through your nose. The hand on your abdomen should move out. Then purse your lips and exhale slowly. You should be able to feel the hand on your abdomen moving in as you exhale.  Controlled coughing Learn and use controlled coughing to clear mucus from your lungs. Controlled coughing is a series of short, progressive coughs. The steps of controlled coughing are: 1. Lean your head slightly forward. 2. Breathe in deeply using diaphragmatic breathing. 3. Try to hold your breath for 3 seconds. 4. Keep your mouth slightly open while coughing twice. 5.  Spit any mucus out into a tissue. 6. Rest and repeat the steps once or twice as needed.  General instructions  Make sure you receive all the vaccines that your health care provider recommends, especially the pneumococcal and influenza vaccines. Preventing infection and hospitalization is very important when you have COPD.  Use oxygen therapy and pulmonary rehabilitation if directed to by your health care provider. If you require home oxygen therapy, ask your health care provider whether you should purchase a pulse oximeter to measure your oxygen level at home.  Work with your health care provider to develop a COPD action plan. This will help you know what steps to take if your condition gets worse.  Keep other chronic health conditions under control as told by your health care provider.  Avoid extreme temperature and humidity changes.  Avoid contact with people who have an illness that spreads from person to person (is contagious), such as viral infections or pneumonia.  Keep all follow-up visits as told by your health care provider. This is important. Contact a health care provider if:  You are coughing up more mucus than usual.  There is a change in the color or thickness of your  mucus.  Your breathing is more labored than usual.  Your breathing is faster than usual.  You have difficulty sleeping.  You need to use your rescue medicines or inhalers more often than expected.  You have trouble doing routine activities such as getting dressed or walking around the house. Get help right away if:  You have shortness of breath while you are resting.  You have shortness of breath that prevents you from: ? Being able to talk. ? Performing your usual physical activities.  You have chest pain lasting longer than 5 minutes.  Your skin color is more blue (cyanotic) than usual.  You measure low oxygen saturations for longer than 5 minutes with a pulse oximeter.  You have a fever.  You feel too tired to breathe normally. Summary  Chronic obstructive pulmonary disease (COPD) is a long-term (chronic) condition that affects the lungs.  Your lung function will probably never return to normal. In most cases, it gets worse over time. However, there are steps you can take to slow the progression of the disease and improve your quality of life.  Treatment for COPD may include taking medicines, quitting smoking, pulmonary rehabilitation, and changes to diet and exercise. As the disease progresses, you may need oxygen therapy, a lung transplant, or palliative care.  To help manage your condition, do not smoke, avoid exposure to things that irritate your lungs, stay up to date on all vaccines, and follow your health care provider's instructions for taking medicines. This information is not intended to replace advice given to you by your health care provider. Make sure you discuss any questions you have with your health care provider. Document Released: 06/28/2005 Document Revised: 10/23/2016 Document Reviewed: 10/23/2016 Elsevier Interactive Patient Education  2018 [employer].   Acute Respiratory Failure, Adult Acute respiratory failure occurs when there is not enough  oxygen passing from your lungs to your body. When this happens, your lungs have trouble removing carbon dioxide from the blood. This causes your blood oxygen level to drop too low as carbon dioxide builds up. Acute respiratory failure is a medical emergency. It can develop quickly, but it is temporary if treated promptly. Your lung capacity, or how much air your lungs can hold, may improve with time, exercise, and treatment. What are the  causes? There are many possible causes of acute respiratory failure, including:  Lung injury.  Chest injury or damage to the ribs or tissues near the lungs.  Lung conditions that affect the flow of air and blood into and out of the lungs, such as pneumonia, acute respiratory distress syndrome, and cystic fibrosis.  Medical conditions, such as strokes or spinal cord injuries, that affect the muscles and nerves that control breathing.  Blood infection (sepsis).  Inflammation of the pancreas (pancreatitis).  A blood clot in the lungs (pulmonary embolism).  A large-volume blood transfusion.  Burns.  Near-drowning.  Seizure.  Smoke inhalation.  Reaction to medicines.  Alcohol or drug overdose.  What increases the risk? This condition is more likely to develop in people who have:  A blocked airway.  Asthma.  A condition or disease that damages or weakens the muscles, nerves, bones, or tissues that are involved in breathing.  A serious infection.  A health problem that blocks the unconscious reflex that is involved in breathing, such as hypothyroidism or sleep apnea.  A lung injury or trauma.  What are the signs or symptoms? Trouble breathing is the main symptom of acute respiratory failure. Symptoms may also include:  Rapid breathing.  Restlessness or anxiety.  Skin, lips, or fingernails that appear blue (cyanosis).  Rapid heart rate.  Abnormal heart rhythms (arrhythmias).  Confusion or changes in behavior.  Tiredness or loss  of energy.  Feeling sleepy or having a loss of consciousness.  How is this diagnosed? Your health care provider can diagnose acute respiratory failure with a medical history and physical exam. During the exam, your health care provider will listen to your heart and check for crackling or wheezing sounds in your lungs. Your may also have tests to confirm the diagnosis and determine what is causing respiratory failure. These tests may include:  Measuring the amount of oxygen in your blood (pulse oximetry). The measurement comes from a small device that is placed on your finger, earlobe, or toe.  Other blood tests to measure blood gases and to look for signs of infection.  Sampling your cerebral spinal fluid or tracheal fluid to check for infections.  Chest X-ray to look for fluid in spaces that should be filled with air.  Electrocardiogram (ECG) to look at the heart's electrical activity.  How is this treated? Treatment for this condition usually takes places in a hospital intensive care unit (ICU). Treatment depends on what is causing the condition. It may include one or more treatments until your symptoms improve. Treatment may include:  Supplemental oxygen. Extra oxygen is given through a tube in the nose, a face mask, or a hood.  A device such as a continuous positive airway pressure (CPAP) or bi-level positive airway pressure (BiPAP or BPAP) machine. This treatment uses mild air pressure to keep the airways open. A mask or other device will be placed over your nose or mouth. A tube that is connected to a motor will deliver oxygen through the mask.  Ventilator. This treatment helps move air into and out of the lungs. This may be done with a bag and mask or a machine. For this treatment, a tube is placed in your windpipe (trachea) so air and oxygen can flow to the lungs.  Extracorporeal membrane oxygenation (ECMO). This treatment temporarily takes over the function of the heart and lungs,  supplying oxygen and removing carbon dioxide. ECMO gives the lungs a chance to recover. It may be used if a ventilator  is not effective.  Tracheostomy. This is a procedure that creates a hole in the neck to insert a breathing tube.  Receiving fluids and medicines.  Rocking the bed to help breathing.  Follow these instructions at home:  Take over-the-counter and prescription medicines only as told by your health care provider.  Return to normal activities as told by your health care provider. Ask your health care provider what activities are safe for you.  Keep all follow-up visits as told by your health care provider. This is important. How is this prevented? Treating infections and medical conditions that may lead to acute respiratory failure can help prevent the condition from developing. Contact a health care provider if:  You have a fever.  Your symptoms do not improve or they get worse. Get help right away if:  You are having trouble breathing.  You lose consciousness.  Your have cyanosis or turn blue.  You develop a rapid heart rate.  You are confused. These symptoms may represent a serious problem that is an emergency. Do not wait to see if the symptoms will go away. Get medical help right away. Call your local emergency services (911 in the [place] Do not drive yourself to the hospital. This information is not intended to replace advice given to you by your health care provider. Make sure you discuss any questions you have with your health care provider. Document Released: 09/23/2013 Document Revised: 04/15/2016 Document Reviewed: 04/05/2016 Elsevier Interactive Patient Education  [employer].

## 2018-02-01 NOTE — Progress Notes (Signed)
I called and discussed lab results with patient including electrolyte abnormalities, elevated potassium, elevated blood sugar, worsening kidney function, and reiterated the dangers of not going to the ER and patient and the patient's son agreed to go directly to any [facility], report was called to the ER. BUN is 80, creatinine 1.74, not much higher than his recent labs however roughly a year ago his creatinine was at 1.09.  Calculated anion gap is 9, pt is hyperglycemic but not in DKA.  He did appear hypovolemic, feel worsening kidney function is likely secondary to uncontrolled insulin-dependent diabetes with history of noncompliance, acutely worsened by high-dose steroid burst which he required for his acute exacerbation of COPD.  He also does takes lasix daily.   white count elevated, 2 days ago was 7.6 today 17.3 with increased absolute neutrophils, secondary to steroids and possibly worsening infection?   If patient does not get admitted he does have a follow-up appointment on Monday with [facility]

## 2018-02-01 NOTE — ED Notes (Signed)
Pt returned from X-ray.  

## 2018-02-01 NOTE — ED Provider Notes (Signed)
[facility] EMERGENCY DEPARTMENT Provider Note   CSN: [id number] Arrival date & time: 02/01/18  1728     History   Chief Complaint Chief Complaint  Patient presents with  . Shortness of Breath    HPI Edward [name] is a 83 y.o. male.  HPI Patient was seen in the emergency department 2 days ago for COPD exacerbation.  Started on short course of prednisone and antibiotics.  Evaluated by his primary physician today for ongoing shortness of breath and cough.  Is also been complaining of a sore throat.  Advised to come to the emergency department.  Patient initially presented without his home O2.  Sats were in the 70s.  Patient denies any chest pain.  He has had ongoing abdominal swelling and lower extremity edema. Past Medical History:  Diagnosis Date  . Allergy    Rhinitis  . Bronchitis   . Chronic respiratory failure ([place])   . Colon polyps   . COPD (chronic obstructive pulmonary disease) ([place])   . Diabetes mellitus   . Elevated lipids   . Hypercholesterolemia   . Hypertension   . Noncompliance   . On home O2    2L N/C   . PSA elevation   . Pulmonary fibrosis ([place])   . Vitamin D deficiency     Patient Active Problem List   Diagnosis Date Noted  . Acute encephalopathy 09/25/2017  . Acute confusion 09/25/2017  . On home O2 09/25/2017  . Bilateral lower extremity edema 06/27/2017  . Insomnia 03/28/2017  . Uncontrolled type 2 diabetes mellitus with hyperglycemia, with long-term current use of insulin ([place]) 03/07/2017  . CKD (chronic kidney disease), stage III ([place]) 03/07/2017  . Palliative care encounter   . Goals of care, counseling/discussion   . Encounter for hospice care discussion   . AF (paroxysmal atrial fibrillation) ([place]) 03/05/2017  . Chronic diastolic CHF (congestive heart failure) ([place]) 03/04/2017  . Chronic respiratory failure ([place]) 03/04/2017  . Atrial fibrillation with rapid ventricular response ([place])   . Chronic diastolic heart failure ([place])   .  Constipation 02/25/2017  . Overflow diarrhea/Constipation 02/25/2017  . Diabetes mellitus type 2, uncontrolled, without complications ([place]) 45/80/9983  . Non compliance w medication regimen 12/02/2015  . Hypercholesterolemia 05/12/2014  . Elevated LFTs 05/12/2014  . Foot laceration 05/12/2014  . Dyspnea 12/19/2013  . COPD exacerbation ([place]) 11/15/2013  . Atrial fibrillation ([place]) 11/10/2013  . Elevated PSA 01/20/2013  . Diabetes mellitus type 2, uncontrolled ([place])   . COPD (chronic obstructive pulmonary disease) ([place])   . Hypertension   . Allergy   . Elevated lipids   . Pulmonary fibrosis ([place])   . Bronchitis   . Colon polyps   . Colon polyps     Past Surgical History:  Procedure Laterality Date  . CATARACT EXTRACTION W/PHACO  06/25/2012   Procedure: CATARACT EXTRACTION PHACO AND INTRAOCULAR LENS PLACEMENT (IOC);  Surgeon: Elta Guadeloupe T. Gershon Crane, MD;  Location: [facility];  Service: Ophthalmology;  Laterality: Left;  CDE=19.01  . CATARACT EXTRACTION W/PHACO  07/09/2012   Procedure: CATARACT EXTRACTION PHACO AND INTRAOCULAR LENS PLACEMENT (IOC);  Surgeon: Elta Guadeloupe T. Gershon Crane, MD;  Location: [facility];  Service: Ophthalmology;  Laterality: Right;  CDE: 20.09        Home Medications    Prior to Admission medications   Medication Sig Start Date End Date Taking? Authorizing Provider  albuterol (PROVENTIL) (2.5 MG/3ML) 0.083% nebulizer solution INHALE 1 VIAL VIA NEBULIZER EVERY 6 HOURS AS NEEDED FOR WHEEZING OR SHORTNESS  OF BREATH 07/19/17   Orlena Sheldon, PA-C  apixaban (ELIQUIS) 2.5 MG TABS tablet Take 1 tablet (2.5 mg total) by mouth 2 (two) times daily. 08/30/17   Dena Billet B, PA-C  budesonide (PULMICORT) 0.5 MG/2ML nebulizer solution Take 2 mLs (0.5 mg total) by nebulization 2 (two) times daily. 07/19/17   Dena Billet B, PA-C  cloNIDine (CATAPRES) 0.1 MG tablet TAKE 1 TABLET BY MOUTH 2 TIMES A DAY 01/28/18   Dena Billet B, PA-C  diltiazem (CARDIZEM CD) 240 MG 24 hr capsule TAKE 1 CAPSULE BY  MOUTH DAILY 12/03/17   Orlena Sheldon, PA-C  furosemide (LASIX) 40 MG tablet Take 1 tablet (40 mg total) by mouth every other day. 09/26/17 09/26/18  Johnson, Clanford L, MD  insulin NPH Human (NOVOLIN N RELION) 100 UNIT/ML injection Inject 0.07 mLs (7 Units total) into the skin 2 (two) times daily before a meal. 09/26/17   Johnson, Clanford L, MD  ipratropium (ATROVENT) 0.02 % nebulizer solution Take 2.5 mLs (0.5 mg total) by nebulization 4 (four) times daily. 07/19/17   Orlena Sheldon, PA-C  levofloxacin (LEVAQUIN) 750 MG tablet Take 1 tablet (750 mg total) by mouth daily for 5 days. 02/01/18 02/06/18  Delsa Grana, PA-C  losartan (COZAAR) 100 MG tablet TAKE 1 TABLET BY MOUTH DAILY 12/31/17   Dena Billet B, PA-C  metoprolol tartrate (LOPRESSOR) 25 MG tablet TAKE 1 TABLET BY MOUTH TWICE DAILY 12/31/17   Dena Billet B, PA-C  NOVOLIN N RELION 100 UNIT/ML injection  INJECT 30 UNITS SUBCUTANEOUSLY WITH BREAKFAST AND SUPPER 01/07/18   Orlena Sheldon, PA-C  omeprazole (PRILOSEC) 20 MG capsule Take 1 capsule (20 mg total) by mouth daily. 03/23/16   Orlena Sheldon, PA-C  ONE TOUCH ULTRA TEST test strip CHECK FASTING BLOOD SUGAR TWICE DAILY 12/03/17   Dena Billet B, PA-C  OXYGEN Inhale 2 L into the lungs daily.    [provider]  potassium chloride SA (K-DUR,KLOR-CON) 20 MEQ tablet Take 1 tablet (20 mEq total) by mouth every other day. Take with lasix. 09/26/17   Johnson, Clanford L, MD  potassium chloride SA (K-DUR,KLOR-CON) 20 MEQ tablet TAKE 2 TABLETS BY MOUTH THREE TIMES A DAY 11/06/17   Dena Billet B, PA-C  pravastatin (PRAVACHOL) 80 MG tablet TAKE 1 TABLET BY MOUTH AT BEDTIME 01/28/18   Dixon, Lonie Peak, PA-C  predniSONE (DELTASONE) 20 MG tablet Take 2 tablets (40 mg total) by mouth daily. 01/30/18   Horton, Barbette Hair, MD    Family History Family History  Problem Relation Age of Onset  . Heart disease Mother   . CAD Other   . Diabetes Other     Social History Social History   Tobacco Use  . Smoking  status: Former Smoker    Packs/day: 1.50    Years: 60.00    Pack years: 90.00    Types: Cigarettes    Last attempt to quit: 12/31/2012    Years since quitting: 5.0  . Smokeless tobacco: Never Used  Substance Use Topics  . Alcohol use: No  . Drug use: No     Allergies   Ace inhibitors   Review of Systems Review of Systems  Constitutional: Negative for chills and fever.  HENT: Positive for sore throat. Negative for trouble swallowing.   Eyes: Negative for visual disturbance.  Respiratory: Positive for cough, shortness of breath and wheezing.   Cardiovascular: Positive for leg swelling. Negative for chest pain and palpitations.  Gastrointestinal: Positive for abdominal  distention. Negative for abdominal pain, diarrhea, nausea and vomiting.  Genitourinary: Positive for frequency. Negative for dysuria, flank pain and hematuria.  Musculoskeletal: Negative for back pain, myalgias and neck pain.  Skin: Negative for rash and wound.  Neurological: Positive for headaches. Negative for dizziness, weakness, light-headedness and numbness.  All other systems reviewed and are negative.    Physical Exam Updated Vital Signs BP (!) 118/53   Pulse 91   Temp 98.6 F (37 C) (Oral)   Resp (!) 23   Ht 5\' 5"  (1.651 m)   Wt 82.1 kg (181 lb)   SpO2 92%   BMI 30.12 kg/m   Physical Exam  Constitutional: He is oriented to person, place, and time. He appears well-developed and well-nourished.  Mild respiratory distress.  HENT:  Head: Normocephalic and atraumatic.  Mouth/Throat: Oropharynx is clear and moist.  Oropharynx is erythematous with few white plaques in the posterior pharynx  Eyes: Pupils are equal, round, and reactive to light. EOM are normal.  Neck: Normal range of motion. Neck supple.  Cardiovascular: Normal rate and regular rhythm. Exam reveals no gallop and no friction rub.  No murmur heard. Pulmonary/Chest: He is in respiratory distress. He has wheezes. He has rales.    Increased work of breathing.  Inspiratory and expiratory wheezes.  Crackles in the left base.  Abdominal: Soft. Bowel sounds are normal. He exhibits distension. He exhibits no mass. There is no tenderness. There is no rebound and no guarding. No hernia.  Musculoskeletal: Normal range of motion. He exhibits edema. He exhibits no tenderness.  2+ bilateral lower extremity pitting edema.  Distal pulses intact.  Lymphadenopathy:    He has no cervical adenopathy.  Neurological: He is alert and oriented to person, place, and time.  Moves all extremities without focal deficit.  Sensation intact.  Skin: Skin is warm and dry. Capillary refill takes less than 2 seconds. No rash noted. He is not diaphoretic. No erythema.  Psychiatric: He has a normal mood and affect. His behavior is normal.  Nursing note and vitals reviewed.    ED Treatments / Results  Labs (all labs ordered are listed, but only abnormal results are displayed) Labs Reviewed  BASIC METABOLIC PANEL - Abnormal; Notable for the following components:      Result Value   Sodium 125 (*)    Potassium 5.8 (*)    Chloride 84 (*)    Glucose, Bld 848 (*)    BUN 84 (*)    Creatinine, Ser 2.06 (*)    Calcium 8.8 (*)    GFR calc non Af Amer 29 (*)    GFR calc Af Amer 33 (*)    All other components within normal limits  CBC - Abnormal; Notable for the following components:   WBC 13.7 (*)    RBC 4.11 (*)    Hemoglobin 9.4 (*)    HCT 31.6 (*)    MCV 76.9 (*)    MCH 22.9 (*)    MCHC 29.7 (*)    RDW 18.7 (*)    Platelets 403 (*)    All other components within normal limits  TROPONIN I - Abnormal; Notable for the following components:   Troponin I 0.22 (*)    All other components within normal limits  URINALYSIS, ROUTINE W REFLEX MICROSCOPIC - Abnormal; Notable for the following components:   Color, Urine STRAW (*)    Glucose, UA >=500 (*)    All other components within normal limits  BRAIN NATRIURETIC PEPTIDE -  Abnormal; Notable  for the following components:   B Natriuretic Peptide 202.0 (*)    All other components within normal limits  CBG MONITORING, ED - Abnormal; Notable for the following components:   Glucose-Capillary >600 (*)    All other components within normal limits  CBG MONITORING, ED - Abnormal; Notable for the following components:   Glucose-Capillary >600 (*)    All other components within normal limits    EKG EKG Interpretation  Date/Time:  Friday Feb 01 2018 17:57:02 EDT Ventricular Rate:  90 PR Interval:    QRS Duration: 79 QT Interval:  356 QTC Calculation: 436 R Axis:   53 Text Interpretation:  Sinus rhythm Minimal ST depression, lateral leads Confirmed by Julianne Rice [id number]) on 02/01/2018 8:27:48 PM   Radiology Dg Chest 2 View  Result Date: 02/01/2018 CLINICAL DATA:  Shortness of breath, nonproductive cough, wheezing EXAM: CHEST - 2 VIEW COMPARISON:  01/30/2018 FINDINGS: Mild patchy opacities in the right mid lung and left lower lobe, suspicious for pneumonia. No pleural effusion or pneumothorax. The heart is normal in size. Visualized osseous structures are within normal limits. IMPRESSION: Mild patchy opacities in the right mid lung and left lower lobe, suspicious for pneumonia. Electronically Signed   By: Julian Hy M.D.   On: 02/01/2018 19:24    Procedures Procedures (including critical care time)  Medications Ordered in ED Medications  dextrose 5 %-0.45 % sodium chloride infusion (has no administration in time range)  insulin regular bolus via infusion 0-10 Units (has no administration in time range)  insulin regular (NOVOLIN R,HUMULIN R) 100 Units in sodium chloride 0.9 % 100 mL (1 Units/mL) infusion (5.4 Units/hr Intravenous New Bag/Given 02/01/18 1954)  dextrose 50 % solution 25 mL (has no administration in time range)  0.9 %  sodium chloride infusion ( Intravenous New Bag/Given 02/01/18 1925)  levofloxacin (LEVAQUIN) IVPB 500 mg (has no administration in time range)   sodium chloride 0.9 % bolus 500 mL (has no administration in time range)  ipratropium-albuterol (DUONEB) 0.5-2.5 (3) MG/3ML nebulizer solution 3 mL (3 mLs Nebulization Given 02/01/18 1813)  albuterol (PROVENTIL) (2.5 MG/3ML) 0.083% nebulizer solution 2.5 mg (2.5 mg Nebulization Given 02/01/18 1813)  methylPREDNISolone sodium succinate (SOLU-MEDROL) 125 mg/2 mL injection 125 mg (125 mg Intravenous Given 02/01/18 1844)   CRITICAL CARE Performed by: Julianne Rice Total critical care time: 35 minutes Critical care time was exclusive of separately billable procedures and treating other patients. Critical care was necessary to treat or prevent imminent or life-threatening deterioration. Critical care was time spent personally by me on the following activities: development of treatment plan with patient and/or surrogate as well as nursing, discussions with consultants, evaluation of patient's response to treatment, examination of patient, obtaining history from patient or surrogate, ordering and performing treatments and interventions, ordering and review of laboratory studies, ordering and review of radiographic studies, pulse oximetry and re-evaluation of patient's condition.  Initial Impression / Assessment and Plan / ED Course  I have reviewed the triage vital signs and the nursing notes.  Pertinent labs & imaging results that were available during my care of the patient were reviewed by me and considered in my medical decision making (see chart for details).    Initial hypoxia is improved on patient's chronic 3 L of oxygen by nasal cannula.  Patient has several infiltrates on x-ray concerning for possible pneumonia.  Elevation white blood cell count which may be related to infection or recent steroid intake.  Covered with antibiotics for  committee acquired pneumonia. Patient's creatinine continues to climb above his baseline.  Likely indicates intravascular dehydration.  However, patient does have  lower extremity edema consistent with third spacing.  We will give gentle hydration and may need diuresis during admission.  Patient also noted to have significantly elevated glucose.  Started on insulin drip.  Patient has mild elevation in troponin.  Denies chest pain.  Discussed with hospitalist who will see patient in the emergency department and admit.   Final Clinical Impressions(s) / ED Diagnoses   Final diagnoses:  COPD exacerbation ([place])  Hyperglycemia  AKI (acute kidney injury) ([place])  Elevated troponin    ED Discharge Orders    None       Julianne Rice, MD 02/01/18 2028

## 2018-02-02 DIAGNOSIS — J181 Lobar pneumonia, unspecified organism: Secondary | ICD-10-CM

## 2018-02-02 DIAGNOSIS — I482 Chronic atrial fibrillation: Secondary | ICD-10-CM

## 2018-02-02 DIAGNOSIS — J441 Chronic obstructive pulmonary disease with (acute) exacerbation: Secondary | ICD-10-CM

## 2018-02-02 DIAGNOSIS — E1100 Type 2 diabetes mellitus with hyperosmolarity without nonketotic hyperglycemic-hyperosmolar coma (NKHHC): Secondary | ICD-10-CM

## 2018-02-02 DIAGNOSIS — I10 Essential (primary) hypertension: Secondary | ICD-10-CM

## 2018-02-02 DIAGNOSIS — J9621 Acute and chronic respiratory failure with hypoxia: Secondary | ICD-10-CM

## 2018-02-02 LAB — GLUCOSE, CAPILLARY
GLUCOSE-CAPILLARY: 517 mg/dL — AB (ref 65–99)
GLUCOSE-CAPILLARY: 471 mg/dL — AB (ref 65–99)
GLUCOSE-CAPILLARY: 448 mg/dL — AB (ref 65–99)
GLUCOSE-CAPILLARY: 294 mg/dL — AB (ref 65–99)
GLUCOSE-CAPILLARY: 270 mg/dL — AB (ref 65–99)
GLUCOSE-CAPILLARY: 230 mg/dL — AB (ref 65–99)
GLUCOSE-CAPILLARY: 463 mg/dL — AB (ref 65–99)
Glucose-Capillary: 216 mg/dL — ABNORMAL HIGH (ref 65–99)
Glucose-Capillary: 312 mg/dL — ABNORMAL HIGH (ref 65–99)
Glucose-Capillary: 327 mg/dL — ABNORMAL HIGH (ref 65–99)
Glucose-Capillary: 333 mg/dL — ABNORMAL HIGH (ref 65–99)
Glucose-Capillary: 340 mg/dL — ABNORMAL HIGH (ref 65–99)
Glucose-Capillary: 356 mg/dL — ABNORMAL HIGH (ref 65–99)
Glucose-Capillary: 360 mg/dL — ABNORMAL HIGH (ref 65–99)
Glucose-Capillary: 370 mg/dL — ABNORMAL HIGH (ref 65–99)
Glucose-Capillary: 376 mg/dL — ABNORMAL HIGH (ref 65–99)
Glucose-Capillary: 392 mg/dL — ABNORMAL HIGH (ref 65–99)
Glucose-Capillary: 569 mg/dL (ref 65–99)

## 2018-02-02 LAB — COMPREHENSIVE METABOLIC PANEL
ALK PHOS: 56 U/L (ref 38–126)
ALT: 22 U/L (ref 17–63)
AST: 21 U/L (ref 15–41)
Albumin: 3.5 g/dL (ref 3.5–5.0)
Anion gap: 12 (ref 5–15)
BUN: 78 mg/dL — AB (ref 6–20)
CALCIUM: 8.7 mg/dL — AB (ref 8.9–10.3)
CHLORIDE: 96 mmol/L — AB (ref 101–111)
CO2: 27 mmol/L (ref 22–32)
CREATININE: 1.94 mg/dL — AB (ref 0.61–1.24)
GFR, EST AFRICAN AMERICAN: 36 mL/min — AB (ref >60)
GFR, EST NON AFRICAN AMERICAN: 31 mL/min — AB (ref >60)
Glucose, Bld: 368 mg/dL — ABNORMAL HIGH (ref 65–99)
Potassium: 4.7 mmol/L (ref 3.5–5.1)
Sodium: 135 mmol/L (ref 135–145)
Total Bilirubin: 0.7 mg/dL (ref 0.3–1.2)
Total Protein: 6.3 g/dL — ABNORMAL LOW (ref 6.5–8.1)

## 2018-02-02 LAB — CBC
HCT: 29.1 % — ABNORMAL LOW (ref 39.0–52.0)
HEMOGLOBIN: 9 g/dL — AB (ref 13.0–17.0)
MCH: 23.1 pg — AB (ref 26.0–34.0)
MCHC: 30.9 g/dL (ref 30.0–36.0)
MCV: 74.6 fL — AB (ref 78.0–100.0)
PLATELETS: 354 10*3/uL (ref 150–400)
RBC: 3.9 MIL/uL — AB (ref 4.22–5.81)
RDW: 17.8 % — ABNORMAL HIGH (ref 11.5–15.5)
WBC: 10.6 10*3/uL — AB (ref 4.0–10.5)

## 2018-02-02 LAB — BASIC METABOLIC PANEL
ANION GAP: 13 (ref 5–15)
BUN: 78 mg/dL — ABNORMAL HIGH (ref 6–20)
CALCIUM: 9 mg/dL (ref 8.9–10.3)
CO2: 26 mmol/L (ref 22–32)
Chloride: 93 mmol/L — ABNORMAL LOW (ref 101–111)
Creatinine, Ser: 1.98 mg/dL — ABNORMAL HIGH (ref 0.61–1.24)
GFR, EST AFRICAN AMERICAN: 35 mL/min — AB (ref >60)
GFR, EST NON AFRICAN AMERICAN: 30 mL/min — AB (ref >60)
Glucose, Bld: 604 mg/dL (ref 65–99)
POTASSIUM: 4.6 mmol/L (ref 3.5–5.1)
SODIUM: 132 mmol/L — AB (ref 135–145)

## 2018-02-02 LAB — MRSA PCR SCREENING: MRSA by PCR: NEGATIVE

## 2018-02-02 LAB — HEMOGLOBIN A1C W/OUT EAG: HEMOGLOBIN A1C: 13.9 %{Hb} — AB (ref <5.7)

## 2018-02-02 MED ORDER — BUDESONIDE 0.5 MG/2ML IN SUSP
0.5000 mg | Freq: Two times a day (BID) | RESPIRATORY_TRACT | Status: DC
  Administered 2018-02-02 – 2018-02-04 (×5): 0.5 mg via RESPIRATORY_TRACT
  Filled 2018-02-02 (×5): qty 2

## 2018-02-02 MED ORDER — INSULIN ASPART 100 UNIT/ML ~~LOC~~ SOLN
0.0000 [IU] | Freq: Three times a day (TID) | SUBCUTANEOUS | Status: DC
  Administered 2018-02-02: 5 [IU] via SUBCUTANEOUS

## 2018-02-02 MED ORDER — INSULIN DETEMIR 100 UNIT/ML ~~LOC~~ SOLN
15.0000 [IU] | Freq: Two times a day (BID) | SUBCUTANEOUS | Status: DC
  Administered 2018-02-02 (×2): 15 [IU] via SUBCUTANEOUS
  Filled 2018-02-02: qty 1
  Filled 2018-02-02 (×5): qty 0.15

## 2018-02-02 MED ORDER — GUAIFENESIN ER 600 MG PO TB12
600.0000 mg | ORAL_TABLET | Freq: Two times a day (BID) | ORAL | Status: DC
  Administered 2018-02-02 – 2018-02-04 (×5): 600 mg via ORAL
  Filled 2018-02-02 (×5): qty 1

## 2018-02-02 MED ORDER — INSULIN ASPART 100 UNIT/ML ~~LOC~~ SOLN
10.0000 [IU] | Freq: Once | SUBCUTANEOUS | Status: AC
  Administered 2018-02-02: 10 [IU] via SUBCUTANEOUS

## 2018-02-02 MED ORDER — METHYLPREDNISOLONE SODIUM SUCC 125 MG IJ SOLR
60.0000 mg | Freq: Three times a day (TID) | INTRAMUSCULAR | Status: DC
  Administered 2018-02-02 – 2018-02-03 (×4): 60 mg via INTRAVENOUS
  Filled 2018-02-02 (×4): qty 2

## 2018-02-02 MED ORDER — METOPROLOL TARTRATE 5 MG/5ML IV SOLN
5.0000 mg | Freq: Once | INTRAVENOUS | Status: AC
  Administered 2018-02-02: 5 mg via INTRAVENOUS
  Filled 2018-02-02: qty 5

## 2018-02-02 MED ORDER — LEVOFLOXACIN IN D5W 500 MG/100ML IV SOLN
500.0000 mg | INTRAVENOUS | Status: DC
  Administered 2018-02-02: 500 mg via INTRAVENOUS
  Filled 2018-02-02: qty 100

## 2018-02-02 MED ORDER — IPRATROPIUM-ALBUTEROL 0.5-2.5 (3) MG/3ML IN SOLN
2.5000 mg | Freq: Three times a day (TID) | RESPIRATORY_TRACT | Status: DC
  Administered 2018-02-02 – 2018-02-03 (×4): 2.5 mg via RESPIRATORY_TRACT
  Filled 2018-02-02 (×4): qty 3

## 2018-02-02 NOTE — Progress Notes (Signed)
Pharmacy Antibiotic Note  Edward [name] is a 83 y.o. male admitted on 02/01/2018 with pneumonia.  Pharmacy has been consulted for levaquin dosing.  Plan: Levaquin 500mg  IV q24h F/U cxs and clinical progress Monitor V/S and labs  Height: 5\' 6"  (167.6 cm) Weight: 181 lb 7 oz (82.3 kg) IBW/kg (Calculated) : 63.8  Temp (24hrs), Avg:98.3 F (36.8 C), Min:97.7 F (36.5 C), Max:99 F (37.2 C)  Recent Labs  Lab 01/30/18 0343 02/01/18 1208 02/01/18 1808 02/01/18 2347 02/02/18 0545  WBC 7.6 17.3* 13.7*  --  10.6*  CREATININE 1.61* 1.74* 2.06* 1.98* 1.94*    Estimated Creatinine Clearance: 30.6 mL/min (A) (by C-G formula based on SCr of 1.94 mg/dL (H)).    Allergies  Allergen Reactions  . Ace Inhibitors Other (See Comments)    Hyperkalemia--07/23/2013:patient states not familiar with the following allergy    Antimicrobials this admission: levaquin 5/3 >>  Dose adjustments this admission: n/a  Microbiology results: 5/3 BCx: pending  5/3 MRSA PCR: negative  Thank you for allowing pharmacy to be a part of this patient's care.  Isac Sarna, BS Pharm D, BCPS Clinical Pharmacist Pager [phone] 02/02/2018 9:01 AM

## 2018-02-02 NOTE — Progress Notes (Signed)
[facility] PROGRESS NOTE  AXXEL GUDE XBJ:[id number] DOB: 11-Sep-1935 DOA: 02/01/2018 PCP: Orlena Sheldon, PA-C  Interim summary and HPI 83 y.o. male, with history of COPD, chronic respiratory failure on 3 L oxygen by nasal cannula, pulmonary fibrosis, chronic diastolic CHF, diabetes mellitus, chronic kidney disease stage III, hypertension, atrial fibrillation who was sent to ED from PCP office after patient was found to be short of breath along with hyperglycemia. Patient found with acute on chronic renal failure in the setting of COPD exacerbation and community-acquired pneumonia.  Patient also found with hyperosmolar nonketotic hyperglycemia.  Assessment/Plan: 1-acute on chronic respiratory failure in the setting of COPD exacerbation, underlying pulmonary fibrosis and community-acquired pneumonia. -Continue Solu-Medrol, add Pulmicort and continue nebulizer treatment -Patient started on flutter valve and Mucinex -Continue current IV antibiotics -Continue oxygen supplementation and wean down to his baseline as tolerated. -follow clinical response   2-hyperosmolar nonketotic hyperglycemic state -Patient receiving treatment with insulin drip -Continue to follow CBGs and may transition to long-acting insulin and a sliding scale insulin when appropriate -Check hemoglobin A1c.  3-pseudohyponatremia -In the setting of severe hyperglycemia -Continue gentle normal saline and correct sugar level -Follow electrolytes in the morning.  4-atrial fibrillation -Rate fluctuating slightly most likely secondary to the use of albuterol -Will change nebulizations to every 8 hours -Continue Cardizem and metoprolol -Continue Eliquis. -Continue monitoring on telemetry.  5-hypertension -Blood pressure stable overall -Continue current antihypertensive regimen -Follow vital signs and further adjust treatment as needed  6-hyperlipidemia -continue statins   7-GERD -continue PPI   Code Status:  Full Family Communication: Son and daughter at bedside. Disposition Plan: Remains in stepdown, continue insulin drip with instruction to transition once his CBGs less than 250.  Continue gentle fluid resuscitation, continue steroids for his COPD exacerbation along with antibiotic for community-acquired pneumonia.  Follow electrolytes and replete and as needed.   Consultants:  None   Procedures:  See below for x-ray reports   Antibiotics:  Levaquin  5/3  HPI/Subjective: Afebrile, overall with some improvement in his condition; still short of breath with minimal exertion, wheezing and having difficulty speaking in full sentences.  Objective: Vitals:   02/02/18 1400 02/02/18 1445  BP: 131/78   Pulse: (!) 123   Resp: (!) 22   Temp:    SpO2: 94% 93%    Intake/Output Summary (Last 24 hours) at 02/02/2018 1547 Last data filed at 02/02/2018 3086 Gross per 24 hour  Intake 1180.36 ml  Output 1800 ml  Net -619.64 ml   Filed Weights   02/01/18 1753 02/01/18 2248 02/02/18 0400  Weight: 82.1 kg (181 lb) 82.4 kg (181 lb 10.5 oz) 82.3 kg (181 lb 7 oz)    Exam:   General: Patient complaining of shortness of breath with exertion and having some difficulty speaking in full sentences.  Denies chest pain, nausea, vomiting, abdominal pain or any other complaints.  He is currently afebrile.  Cardiovascular: Irregular irregular, telemetry evaluation demonstrated patient to be in atrial fibrillation, no JVD, no rubs, no gallops.  Respiratory: Positive for scattered rhonchi, diffuse expiratory wheezing, mild tachypnea, no using accessory muscles.  Abdomen: Soft, nontender, nondistended, positive bowel sounds.  Musculoskeletal: No edema, no cyanosis, no clubbing.  Data Reviewed: Basic Metabolic Panel: Recent Labs  Lab 01/30/18 0343 02/01/18 1208 02/01/18 1808 02/01/18 2347 02/02/18 0545  NA 135 128* 125* 132* 135  K 5.0 5.6* 5.8* 4.6 4.7  CL 94* 87* 84* 93* 96*  CO2 29 32 26 26 27  GLUCOSE 246* 612* 848* 604* 368*  BUN 58* 80* 84* 78* 78*  CREATININE 1.61* 1.74* 2.06* 1.98* 1.94*  CALCIUM 9.9 9.6 8.8* 9.0 8.7*   Liver Function Tests: Recent Labs  Lab 02/02/18 0545  AST 21  ALT 22  ALKPHOS 56  BILITOT 0.7  PROT 6.3*  ALBUMIN 3.5   CBC: Recent Labs  Lab 01/30/18 0343 02/01/18 1208 02/01/18 1808 02/02/18 0545  WBC 7.6 17.3* 13.7* 10.6*  NEUTROABS 4.1 15,016*  --   --   HGB 9.8* 9.9* 9.4* 9.0*  HCT 33.6* 33.2* 31.6* 29.1*  MCV 75.2* 75.1* 76.9* 74.6*  PLT 374 462* 403* 354   Cardiac Enzymes: Recent Labs  Lab 01/30/18 0343 02/01/18 1808  TROPONINI <0.03 0.22*   BNP (last 3 results) Recent Labs    09/25/17 1106 01/30/18 0357 02/01/18 1808  BNP 142.0* 39.0 202.0*   CBG: Recent Labs  Lab 02/02/18 1136 02/02/18 1219 02/02/18 1326 02/02/18 1432 02/02/18 1534  GLUCAP 312* 327* 294* 270* 230*    Recent Results (from the past 240 hour(s))  STREP GROUP A AG, W/REFLEX TO CULT     Status: None   Collection Time: 02/01/18 10:57 AM  Result Value Ref Range Status   Streptococcus, Group A Screen (Direct) NONE DETECTED  Final  MRSA PCR Screening     Status: None   Collection Time: 02/01/18 10:40 PM  Result Value Ref Range Status   MRSA by PCR NEGATIVE NEGATIVE Final    Comment:        The GeneXpert MRSA Assay (FDA approved for NASAL specimens only), is one component of a comprehensive MRSA colonization surveillance program. It is not intended to diagnose MRSA infection nor to guide or monitor treatment for MRSA infections. Performed at [facility], [address], [place], [place] [address]   Culture, blood (Routine X 2) w Reflex to ID Panel     Status: None (Preliminary result)   Collection Time: 02/01/18 11:47 PM  Result Value Ref Range Status   Specimen Description BLOOD RIGHT ARM  Final   Special Requests   Final    BOTTLES DRAWN AEROBIC AND ANAEROBIC Blood Culture adequate volume   Culture   Final    NO GROWTH < 12  HOURS Performed at [facility], [address] [place], [place] [address]    Report Status PENDING  Incomplete  Culture, blood (Routine X 2) w Reflex to ID Panel     Status: None (Preliminary result)   Collection Time: 02/02/18 12:00 AM  Result Value Ref Range Status   Specimen Description BLOOD RIGHT HAND  Final   Special Requests   Final    BOTTLES DRAWN AEROBIC AND ANAEROBIC Blood Culture adequate volume   Culture   Final    NO GROWTH < 12 HOURS Performed at [facility], [address] [place], [place] [address]    Report Status PENDING  Incomplete     Studies: Dg Chest 2 View  Result Date: 02/01/2018 CLINICAL DATA:  Shortness of breath, nonproductive cough, wheezing EXAM: CHEST - 2 VIEW COMPARISON:  01/30/2018 FINDINGS: Mild patchy opacities in the right mid lung and left lower lobe, suspicious for pneumonia. No pleural effusion or pneumothorax. The heart is normal in size. Visualized osseous structures are within normal limits. IMPRESSION: Mild patchy opacities in the right mid lung and left lower lobe, suspicious for pneumonia. Electronically Signed   By: Julian Hy M.D.   On: 02/01/2018 19:24    Scheduled Meds: . apixaban  2.5 mg Oral BID  . budesonide (PULMICORT) nebulizer solution  0.5 mg Nebulization BID  . cloNIDine  0.1 mg Oral BID  . diltiazem  240 mg Oral Daily  . guaiFENesin  600 mg Oral BID  . insulin aspart  0-15 Units Subcutaneous TID WC  . insulin detemir  15 Units Subcutaneous BID  . insulin regular  0-10 Units Intravenous TID WC  . ipratropium-albuterol  2.5 mg Nebulization Q6H  . mouth rinse  15 mL Mouth Rinse BID  . methylPREDNISolone (SOLU-MEDROL) injection  60 mg Intravenous Q8H  . metoprolol tartrate  5 mg Intravenous Once  . metoprolol tartrate  25 mg Oral BID  . pantoprazole  40 mg Oral Daily  . pravastatin  80 mg Oral QHS   Continuous Infusions: . sodium chloride 50 mL/hr at 02/02/18 0924  . insulin (NOVOLIN-R) infusion 15.3 Units/hr  (02/02/18 1536)  . levofloxacin (LEVAQUIN) IV      Time spent: 35 minutes (over 50% of the time dedicated to face to face examination, coordination of care and discussion with family at bedside)    [facility] Pager [phone]. If 7PM-7AM, please contact night-coverage at [email or web address], password [facility] 02/02/2018, 3:47 PM  LOS: 1 day

## 2018-02-02 NOTE — Progress Notes (Signed)
CBG 463. Floor coverage MD notified.

## 2018-02-02 NOTE — Progress Notes (Signed)
In/out cath done. 600 ml output of yellow urine. Pt tolerated well.

## 2018-02-03 DIAGNOSIS — I5032 Chronic diastolic (congestive) heart failure: Secondary | ICD-10-CM

## 2018-02-03 DIAGNOSIS — E1165 Type 2 diabetes mellitus with hyperglycemia: Secondary | ICD-10-CM

## 2018-02-03 LAB — CULTURE, GROUP A STREP
MICRO NUMBER: 90542433
SPECIMEN QUALITY:: ADEQUATE

## 2018-02-03 LAB — BASIC METABOLIC PANEL
Anion gap: 10 (ref 5–15)
BUN: 68 mg/dL — ABNORMAL HIGH (ref 6–20)
CHLORIDE: 94 mmol/L — AB (ref 101–111)
CO2: 28 mmol/L (ref 22–32)
Calcium: 8.4 mg/dL — ABNORMAL LOW (ref 8.9–10.3)
Creatinine, Ser: 1.8 mg/dL — ABNORMAL HIGH (ref 0.61–1.24)
GFR calc non Af Amer: 34 mL/min — ABNORMAL LOW (ref >60)
GFR, EST AFRICAN AMERICAN: 39 mL/min — AB (ref >60)
Glucose, Bld: 574 mg/dL (ref 65–99)
Potassium: 5.3 mmol/L — ABNORMAL HIGH (ref 3.5–5.1)
SODIUM: 132 mmol/L — AB (ref 135–145)

## 2018-02-03 LAB — GLUCOSE, CAPILLARY
GLUCOSE-CAPILLARY: 597 mg/dL — AB (ref 65–99)
GLUCOSE-CAPILLARY: 527 mg/dL — AB (ref 65–99)
Glucose-Capillary: >600 mg/dL (ref 65–99)
Glucose-Capillary: 383 mg/dL — ABNORMAL HIGH (ref 65–99)

## 2018-02-03 LAB — GLUCOSE, RANDOM
Glucose, Bld: 669 mg/dL (ref 65–99)
Glucose, Bld: 491 mg/dL — ABNORMAL HIGH (ref 65–99)

## 2018-02-03 MED ORDER — INSULIN ASPART 100 UNIT/ML ~~LOC~~ SOLN
0.0000 [IU] | Freq: Three times a day (TID) | SUBCUTANEOUS | Status: DC
  Administered 2018-02-03 – 2018-02-04 (×2): 20 [IU] via SUBCUTANEOUS
  Administered 2018-02-04: 15 [IU] via SUBCUTANEOUS

## 2018-02-03 MED ORDER — INSULIN DETEMIR 100 UNIT/ML ~~LOC~~ SOLN
30.0000 [IU] | Freq: Two times a day (BID) | SUBCUTANEOUS | Status: DC
  Administered 2018-02-03 (×2): 30 [IU] via SUBCUTANEOUS
  Filled 2018-02-03 (×3): qty 0.3

## 2018-02-03 MED ORDER — INSULIN ASPART 100 UNIT/ML ~~LOC~~ SOLN
7.0000 [IU] | Freq: Three times a day (TID) | SUBCUTANEOUS | Status: DC
  Administered 2018-02-03 – 2018-02-04 (×4): 7 [IU] via SUBCUTANEOUS

## 2018-02-03 MED ORDER — INSULIN ASPART 100 UNIT/ML ~~LOC~~ SOLN
20.0000 [IU] | Freq: Once | SUBCUTANEOUS | Status: AC
  Administered 2018-02-03: 20 [IU] via SUBCUTANEOUS

## 2018-02-03 MED ORDER — INSULIN ASPART 100 UNIT/ML ~~LOC~~ SOLN
30.0000 [IU] | Freq: Once | SUBCUTANEOUS | Status: AC
  Administered 2018-02-03: 30 [IU] via SUBCUTANEOUS

## 2018-02-03 MED ORDER — LEVOFLOXACIN 500 MG PO TABS
500.0000 mg | ORAL_TABLET | Freq: Every day | ORAL | Status: DC
  Administered 2018-02-03: 500 mg via ORAL
  Filled 2018-02-03: qty 1

## 2018-02-03 MED ORDER — INSULIN ASPART 100 UNIT/ML ~~LOC~~ SOLN
0.0000 [IU] | Freq: Every day | SUBCUTANEOUS | Status: DC
  Administered 2018-02-03: 5 [IU] via SUBCUTANEOUS

## 2018-02-03 MED ORDER — PREDNISONE 20 MG PO TABS
40.0000 mg | ORAL_TABLET | Freq: Every day | ORAL | Status: DC
  Administered 2018-02-04: 40 mg via ORAL
  Filled 2018-02-03: qty 2

## 2018-02-03 NOTE — Progress Notes (Addendum)
[facility] -mterrnPROGRESS NOTE  Edward [name] FTD:[id number] DOB: 1935-06-09 DOA: 02/01/2018 PCP: Orlena Sheldon, PA-C  Interim summary and HPI 83 y.o. male,withe history of COPD, chronic respiratory failure on 3 L oxygen by nasal cannula, pulmonary fibrosis, chronic diastolic CHF, diabetes mellitus, chronic kidney disease stage III, hypertension, atrial fibrillation who was sent to ED from PCP office after patient was found to be short of breath along with hyperglycemia. Patient found with acute on chronic renal failure in the setting of COPD exacerbation and community-acquired pneumonia.  Patient also found with hyperosmolar nonketotic hyperglycemia.  Assessment/Plan: 1-acute on chronic respiratory failure with hypoxia: in the setting of COPD exacerbation, underlying pulmonary fibrosis and community-acquired pneumonia. -Given improvement in his breathing, will transition steroids to p.o., continue nebulizer treatment, continue weaning oxygen down to his baseline as tolerated. -Continue the use of flutter valve and continue also Mucinex.    2-hyperosmolar nonketotic hyperglycemic state -Patient was treated with insulin drip and subsequently transitioned to long-acting and sliding scale insulin -Due to the use of steroids his CBGs are slightly difficult to control and fluctuating. patient receiving treatment with insulin drip -hemoglobin A1c pending   3-pseudohyponatremia -In the setting of severe hyperglycemia -improved -corrected sodium WNR  4-atrial fibrillation -Rate fluctuating slightly most likely secondary to the use of albuterol -stable now -Continue Cardizem and metoprolol -Continue Eliquis. -will move to med-surg bed  5-hypertension -Blood pressure stable overall -Continue current antihypertensive regimen -Follow vital signs and further adjust treatment as needed  6-hyperlipidemia -continue statins   7-GERD -continue PPI  8-patient with increase post-voiding  residual -required in/out cath X1 -increase physical activity -follow urine output   Code Status: Full Family Communication: Son and daughter at bedside. Disposition Plan: will transfer to med-surg bed; transition abx's and steroids to PO. Continue adjusting insulin therapy.   Consultants:  None   Procedures:  See below for x-ray reports   Antibiotics:  Levaquin  5/3>>5/8  HPI/Subjective: No fever, good air movement bilaterally, wheezing. Breathing a lot better and would like to go home.  Objective: Vitals:   02/03/18 1230 02/03/18 1400  BP:    Pulse:    Resp:    Temp: 98.1 F (36.7 C)   SpO2:  100%    Intake/Output Summary (Last 24 hours) at 02/03/2018 1523 Last data filed at 02/03/2018 1400 Gross per 24 hour  Intake 1500 ml  Output 3995 ml  Net -2495 ml   Filed Weights   02/01/18 2248 02/02/18 0400 02/03/18 0500  Weight: 82.4 kg (181 lb 10.5 oz) 82.3 kg (181 lb 7 oz) 84 kg (185 lb 3 oz)    Exam:   General: Patient denies chest pain, reports his breathing continued to improve, no nausea, no vomiting and feeling much better.  Overnight required in and out cath for residual post portal of more than 500 mL.  No abdominal pain, dysuria or fever,  Cardiovascular: Rate controlled, no JVD, no murmurs, no carotid Dopplers.  Respiratory: Positive for scattered rhonchi, improved expiratory wheezing, mild tachypnea and no using accessory muscles.  Reports her breathing is close to his baseline.  Abdomen: Soft, nontender, nondistended, positive bowel sounds.    Musculoskeletal: No edema, no cyanosis, no clubbing, good range of mmotion.  Basic Metabolic Panel: Recent Labs  Lab 02/01/18 1208 02/01/18 1808 02/01/18 2347 02/02/18 0545 02/03/18 0503 02/03/18 1200  NA 128* 125* 132* 135 132*  --   K 5.6* 5.8* 4.6 4.7 5.3*  --   CL 87* 84* 93*  96* 94*  --   CO2 32 26 26 27 28   --   GLUCOSE 612* 848* 604* 368* 574* 669*  BUN 80* 84* 78* 78* 68*  --   CREATININE  1.74* 2.06* 1.98* 1.94* 1.80*  --   CALCIUM 9.6 8.8* 9.0 8.7* 8.4*  --    Liver Function Tests: Recent Labs  Lab 02/02/18 0545  AST 21  ALT 22  ALKPHOS 56  BILITOT 0.7  PROT 6.3*  ALBUMIN 3.5   CBC: Recent Labs  Lab 01/30/18 0343 02/01/18 1208 02/01/18 1808 02/02/18 0545  WBC 7.6 17.3* 13.7* 10.6*  NEUTROABS 4.1 15,016*  --   --   HGB 9.8* 9.9* 9.4* 9.0*  HCT 33.6* 33.2* 31.6* 29.1*  MCV 75.2* 75.1* 76.9* 74.6*  PLT 374 462* 403* 354   Cardiac Enzymes: Recent Labs  Lab 01/30/18 0343 02/01/18 1808  TROPONINI <0.03 0.22*   BNP (last 3 results) Recent Labs    09/25/17 1106 01/30/18 0357 02/01/18 1808  BNP 142.0* 39.0 202.0*   CBG: Recent Labs  Lab 02/02/18 1534 02/02/18 1652 02/02/18 2109 02/03/18 0749 02/03/18 1126  GLUCAP 230* 216* 463* 597* >600*    Recent Results (from the past 240 hour(s))  STREP GROUP A AG, W/REFLEX TO CULT     Status: None   Collection Time: 02/01/18 10:57 AM  Result Value Ref Range Status   Streptococcus, Group A Screen (Direct) NONE DETECTED  Final  Culture, Group A Strep     Status: None   Collection Time: 02/01/18 10:57 AM  Result Value Ref Range Status   MICRO NUMBER: [id number]  Final   SPECIMEN QUALITY: ADEQUATE  Final   SOURCE: NOT GIVEN  Final   STATUS: FINAL  Final   RESULT: No group A Streptococcus isolated  Final  MRSA PCR Screening     Status: None   Collection Time: 02/01/18 10:40 PM  Result Value Ref Range Status   MRSA by PCR NEGATIVE NEGATIVE Final    Comment:        The GeneXpert MRSA Assay (FDA approved for NASAL specimens only), is one component of a comprehensive MRSA colonization surveillance program. It is not intended to diagnose MRSA infection nor to guide or monitor treatment for MRSA infections. Performed at [facility], [address] [place], [place] [address]   Culture, blood (Routine X 2) w Reflex to ID Panel     Status: None (Preliminary result)   Collection Time: 02/01/18 11:47 PM   Result Value Ref Range Status   Specimen Description BLOOD RIGHT ARM  Final   Special Requests   Final    BOTTLES DRAWN AEROBIC AND ANAEROBIC Blood Culture adequate volume   Culture   Final    NO GROWTH 1 DAY Performed at [facility], [address] [place], [place] [address]    Report Status PENDING  Incomplete  Culture, blood (Routine X 2) w Reflex to ID Panel     Status: None (Preliminary result)   Collection Time: 02/02/18 12:00 AM  Result Value Ref Range Status   Specimen Description BLOOD RIGHT HAND  Final   Special Requests   Final    BOTTLES DRAWN AEROBIC AND ANAEROBIC Blood Culture adequate volume   Culture   Final    NO GROWTH 1 DAY Performed at [facility], [address], [place], [place] [address]    Report Status PENDING  Incomplete     Studies: Dg Chest 2 View  Result Date: 02/01/2018 CLINICAL DATA:  Shortness of breath, nonproductive cough, wheezing EXAM: CHEST - 2 VIEW COMPARISON:  01/30/2018 FINDINGS: Mild patchy opacities in the right mid lung and left lower lobe, suspicious for pneumonia. No pleural effusion or pneumothorax. The heart is normal in size. Visualized osseous structures are within normal limits. IMPRESSION: Mild patchy opacities in the right mid lung and left lower lobe, suspicious for pneumonia. Electronically Signed   By: Julian Hy M.D.   On: 02/01/2018 19:24    Scheduled Meds: . apixaban  2.5 mg Oral BID  . budesonide (PULMICORT) nebulizer solution  0.5 mg Nebulization BID  . cloNIDine  0.1 mg Oral BID  . diltiazem  240 mg Oral Daily  . guaiFENesin  600 mg Oral BID  . insulin aspart  0-15 Units Subcutaneous TID WC  . insulin aspart  7 Units Subcutaneous TID WC  . insulin detemir  30 Units Subcutaneous BID  . insulin regular  0-10 Units Intravenous TID WC  . ipratropium-albuterol  2.5 mg Nebulization Q8H  . levofloxacin  500 mg Oral Q2000  . mouth rinse  15 mL Mouth Rinse BID  . metoprolol tartrate  25 mg Oral BID  . pantoprazole   40 mg Oral Daily  . pravastatin  80 mg Oral QHS  . [START ON 02/04/2018] predniSONE  40 mg Oral Q breakfast   Continuous Infusions: . sodium chloride 50 mL/hr at 02/03/18 0423  . insulin (NOVOLIN-R) infusion Stopped (02/02/18 1645)    Time spent: 30 minutes (over 50% of the time dedicated to face to face examination, coordination of care and discussion with family at bedside)    [facility] Pager [phone]. If 7PM-7AM, please contact night-coverage at [email or web address], password [facility] 02/03/2018, 3:23 PM  LOS: 2 days

## 2018-02-04 ENCOUNTER — Ambulatory Visit: Payer: Medicare Other | Admitting: Physician Assistant

## 2018-02-04 DIAGNOSIS — J189 Pneumonia, unspecified organism: Principal | ICD-10-CM

## 2018-02-04 DIAGNOSIS — K219 Gastro-esophageal reflux disease without esophagitis: Secondary | ICD-10-CM

## 2018-02-04 LAB — GLUCOSE, CAPILLARY
Glucose-Capillary: 326 mg/dL — ABNORMAL HIGH (ref 65–99)
Glucose-Capillary: 381 mg/dL — ABNORMAL HIGH (ref 65–99)

## 2018-02-04 LAB — HEMOGLOBIN A1C
Hgb A1c MFr Bld: 13.3 % — ABNORMAL HIGH (ref 4.8–5.6)
MEAN PLASMA GLUCOSE: 335 mg/dL

## 2018-02-04 MED ORDER — IPRATROPIUM-ALBUTEROL 0.5-2.5 (3) MG/3ML IN SOLN
2.5000 mg | Freq: Three times a day (TID) | RESPIRATORY_TRACT | Status: DC
  Administered 2018-02-04: 2.5 mg via RESPIRATORY_TRACT
  Filled 2018-02-04: qty 3

## 2018-02-04 MED ORDER — OMEPRAZOLE 20 MG PO CPDR: 20.0000 mg | DELAYED_RELEASE_CAPSULE | Freq: Every day | ORAL | 3 refills | Status: DC

## 2018-02-04 MED ORDER — INSULIN DETEMIR 100 UNIT/ML ~~LOC~~ SOLN
35.0000 [IU] | Freq: Two times a day (BID) | SUBCUTANEOUS | Status: DC
  Administered 2018-02-04: 35 [IU] via SUBCUTANEOUS
  Filled 2018-02-04 (×3): qty 0.35

## 2018-02-04 MED ORDER — BLOOD GLUCOSE MONITOR KIT: PACK | 0 refills | Status: DC

## 2018-02-04 MED ORDER — INSULIN DETEMIR 100 UNIT/ML FLEXPEN: 35.0000 [IU] | Freq: Two times a day (BID) | SUBCUTANEOUS | 3 refills | Status: DC

## 2018-02-04 MED ORDER — HUMALOG JUNIOR KWIKPEN 100 UNIT/ML ~~LOC~~ SOPN: 20.0000 [IU] | PEN_INJECTOR | Freq: Three times a day (TID) | SUBCUTANEOUS | 3 refills | Status: DC

## 2018-02-04 MED ORDER — GUAIFENESIN ER 600 MG PO TB12: 600.0000 mg | ORAL_TABLET | Freq: Two times a day (BID) | ORAL | 0 refills | Status: DC

## 2018-02-04 MED ORDER — PEN NEEDLES 31G X 8 MM MISC: 3 refills | Status: DC

## 2018-02-04 MED ORDER — PREDNISONE 20 MG PO TABS: ORAL_TABLET | ORAL | 0 refills | Status: DC

## 2018-02-04 MED ORDER — LEVOFLOXACIN 500 MG PO TABS: 500.0000 mg | ORAL_TABLET | Freq: Every day | ORAL | 0 refills | Status: AC

## 2018-02-04 NOTE — Care Management Important Message (Signed)
Important Message  Patient Details  Name: Edward [name] MRN: [id number] Date of Birth: [birth year]   Medicare Important Message Given:  Yes    [name] 02/04/2018, 11:31 AM

## 2018-02-04 NOTE — Discharge Summary (Signed)
Physician Discharge Summary  Edward [name] TDH:[id number] DOB: April 17, 1935 DOA: 02/01/2018  PCP: Orlena Sheldon, PA-C  Admit date: 02/01/2018 Discharge date: 02/04/2018  Time spent: 35 minutes  Recommendations for Outpatient Follow-up:  1. Repeat basic metabolic panel to follow electrolytes and renal function 2. Reassess patient blood pressure and adjust antihypertensive regimen as needed 3. Please follow closely patient's CBGs and A1c and further adjust hypoglycemic regimen as required. 4. Repeat chest x-ray in 4 to 6 weeks to assure complete resolution of the lungs infiltrates.   Discharge Diagnoses:  Active Problems:   Diabetes mellitus type 2, uncontrolled ([name])   COPD (chronic obstructive pulmonary disease) ([name])   Hypertension   Atrial fibrillation ([name])   Acute on chronic respiratory failure with hypoxia ([name])   Dyspnea   Chronic diastolic CHF (congestive heart failure) ([name])   Chronic respiratory failure ([name])   CAP (community acquired pneumonia)   Gastroesophageal reflux disease   Discharge Condition: Stable and improved.  Patient discharged home with instruction to follow-up with PCP in 10 days.  Diet recommendation: Heart healthy diet and modified carbohydrates.  Filed Weights   02/02/18 0400 02/03/18 0500 02/04/18 0500  Weight: 82.3 kg (181 lb 7 oz) 84 kg (185 lb 3 oz) 84 kg (185 lb 3 oz)    History of present illness:  As per H&P written by Dr. Darrick Meigs 02/01/18 82 y.o.male,withe history of COPD, chronic respiratory failure on 3 L oxygen by nasal cannula, pulmonary fibrosis, chronic diastolic CHF, diabetes mellitus, chronic kidney disease stage III, hypertension, atrial fibrillation who was sent to ED from PCP office after patient was found to be short of breath along with hyperglycemia. Patient found with acute on chronic renal failure in the setting of COPD exacerbation and community-acquired pneumonia. Patient also found with hyperosmolar nonketotic  hyperglycemia.  Hospital Course:  1-acute on chronic respiratory failure with hypoxia: in the setting of COPD exacerbation, underlying pulmonary fibrosis and community-acquired pneumonia. -Given improvement in his breathing, will discharge with steroids tapering, continue nebulizer treatment, and PO antibiotics. -patient's oxygen back to baseline supplementation.  -Continue the use of flutter valve and continue also Mucinex. -outpatient follow up with PCP in 10 days.     2-hyperosmolar nonketotic hyperglycemic state: in a patient with uncontrolled type diabetes with nephropathy -Patient was treated with insulin drip and subsequently transitioned to long-acting and sliding scale insulin -Due to the use of steroids his CBGs were slightly more difficult to control and were fluctuating.  -hemoglobin A1c 13.9, demonstrating poor control -patient has been discharge on levemir BID and TID novolog -instructed to follow low carb diet and to check CBG's three times a day. -close follow up with PCP to further adjust hypoglycemic regimen as needed.   3-pseudohyponatremia -In the setting of severe hyperglycemia -improved/resolved -Will recommend repeat basic metabolic panel at follow-up visit to reassess electrolytes trend. -Patient advised to maintain adequate hydration.  4-atrial fibrillation -Rate fluctuating slightly on admission, most likely secondary to the use of albuterol  -stable and well controlled now -Continue Cardizem and metoprolol -Continue Eliquis. -will move to med-surg bed  5-hypertension -Blood pressure stable overall -Continue current antihypertensive regimen -Follow vital signs and further adjust treatment as needed  6-hyperlipidemia -continue statins   7-GERD -continue PPI  8-patient with increase post-voiding residual -required in/out cath X1 -increase physical activity and resume lasix -most likely some underlying BPH -no further symptoms of urinary  retention appreciated on exam and no complaints from patient. -further outpatient follow up and reassessment recommended.  9-acute on chronic renal failure -stage 3 at baseline -acute failure In the setting of dehydration (most likely from hyperglycemia) -advice to keep himself well hydrated at discharge -Cr essentially back to baseline at discharge -repeat BMET to follow electrolytes and renal function   Procedures:  See below for x-ray reports.  Consultations:  None none  Discharge Exam: Vitals:   02/04/18 1045 02/04/18 1100  BP:    Pulse: 78 79  Resp: 16 17  Temp:  98 F (36.7 C)  SpO2: 97% 97%     General: Patient denies chest pain, reports his breathing is back to baseline.  Denies nausea, vomiting or any other acute complaints.  He would like to go home.    Cardiovascular: Rate controlled, no JVD, no murmurs, no carotid Dopplers.  Respiratory: Positive for scattered rhonchi, improved air movement and just mild bibasilar expiratory wheezing, mild tachypnea and no using accessory muscles.  Reports his breathing is back to baseline.  Abdomen: Soft, nontender, nondistended, positive bowel sounds.    Musculoskeletal: No edema, no cyanosis, no clubbing, good range of motion.    Discharge Instructions   Discharge Instructions    Diet - low sodium heart healthy   Complete by:  As directed    Diet Carb Modified   Complete by:  As directed    Discharge instructions   Complete by:  As directed    Take medications as prescribed Follow modified carbohydrate diet Follow-up with PCP in 10 days Check your sugar at least 3 times a day and do not skip meals   Increase activity slowly   Complete by:  As directed      Allergies as of 02/04/2018      Reactions   Ace Inhibitors Other (See Comments)   Hyperkalemia--07/23/2013:patient states not familiar with the following allergy      Medication List    STOP taking these medications   NOVOLIN N RELION 100 UNIT/ML  injection Generic drug:  insulin NPH Human     TAKE these medications   albuterol (2.5 MG/3ML) 0.083% nebulizer solution Commonly known as:  PROVENTIL INHALE 1 VIAL VIA NEBULIZER EVERY 6 HOURS AS NEEDED FOR WHEEZING OR SHORTNESS OF BREATH   apixaban 2.5 MG Tabs tablet Commonly known as:  ELIQUIS Take 1 tablet (2.5 mg total) by mouth 2 (two) times daily.   aspirin EC 81 MG tablet Take 81 mg by mouth daily.   blood glucose meter kit and supplies Kit Dispense based on patient and insurance preference. Use up to four times daily as directed. (FOR ICD-9 250.00, 250.01).   budesonide 0.5 MG/2ML nebulizer solution Commonly known as:  PULMICORT Take 2 mLs (0.5 mg total) by nebulization 2 (two) times daily.   cloNIDine 0.1 MG tablet Commonly known as:  CATAPRES TAKE 1 TABLET BY MOUTH 2 TIMES A DAY   diltiazem 240 MG 24 hr capsule Commonly known as:  CARDIZEM CD TAKE 1 CAPSULE BY MOUTH DAILY   furosemide 40 MG tablet Commonly known as:  LASIX Take 1 tablet (40 mg total) by mouth every other day.   guaiFENesin 600 MG 12 hr tablet Commonly known as:  MUCINEX Take 1 tablet (600 mg total) by mouth 2 (two) times daily.   insulin detemir 100 unit/ml Soln Commonly known as:  LEVEMIR Inject 0.35 mLs (35 Units total) into the skin 2 (two) times daily.   insulin lispro 100 UNIT/ML KwikPen Junior Inject 0.2 mLs (20 Units total) into the skin 3 (three) times daily.  ipratropium 0.02 % nebulizer solution Commonly known as:  ATROVENT Take 2.5 mLs (0.5 mg total) by nebulization 4 (four) times daily.   levofloxacin 500 MG tablet Commonly known as:  LEVAQUIN Take 1 tablet (500 mg total) by mouth daily at 8 pm for 5 days. What changed:    medication strength  how much to take  when to take this   losartan 100 MG tablet Commonly known as:  COZAAR TAKE 1 TABLET BY MOUTH DAILY   metoprolol tartrate 25 MG tablet Commonly known as:  LOPRESSOR TAKE 1 TABLET BY MOUTH TWICE DAILY    omeprazole 20 MG capsule Commonly known as:  PRILOSEC Take 1 capsule (20 mg total) by mouth daily.   OXYGEN Inhale 2 L into the lungs daily.   Pen Needles 31G X 8 MM Misc Use as needed to inject insulin as prescribed.   potassium chloride SA 20 MEQ tablet Commonly known as:  K-DUR,KLOR-CON Take 1 tablet (20 mEq total) by mouth every other day. Take with lasix. What changed:  Another medication with the same name was removed. Continue taking this medication, and follow the directions you see here.   pravastatin 80 MG tablet Commonly known as:  PRAVACHOL TAKE 1 TABLET BY MOUTH AT BEDTIME   predniSONE 20 MG tablet Commonly known as:  DELTASONE Take 2 tablets by mouth daily x2 days; then 1 tablet by mouth daily x3 days; then half a tablet by mouth daily x3 days and stop prednisone. What changed:    medication strength  how much to take  how to take this  when to take this  additional instructions      Allergies  Allergen Reactions  . Ace Inhibitors Other (See Comments)    Hyperkalemia--07/23/2013:patient states not familiar with the following allergy   Follow-up Information    Orlena Sheldon, PA-C. Schedule an appointment as soon as possible for a visit in 10 day(s).   Specialty:  Physician Assistant Contact information: [place] [address] [phone]           The results of significant diagnostics from this hospitalization (including imaging, microbiology, ancillary and laboratory) are listed below for reference.    Significant Diagnostic Studies: Dg Chest 2 View  Result Date: 02/01/2018 CLINICAL DATA:  Shortness of breath, nonproductive cough, wheezing EXAM: CHEST - 2 VIEW COMPARISON:  01/30/2018 FINDINGS: Mild patchy opacities in the right mid lung and left lower lobe, suspicious for pneumonia. No pleural effusion or pneumothorax. The heart is normal in size. Visualized osseous structures are within normal limits. IMPRESSION: Mild  patchy opacities in the right mid lung and left lower lobe, suspicious for pneumonia. Electronically Signed   By: Julian Hy M.D.   On: 02/01/2018 19:24   Dg Chest Portable 1 View  Result Date: 01/30/2018 CLINICAL DATA:  Increasing shortness of breath EXAM: PORTABLE CHEST 1 VIEW COMPARISON:  09/25/2017 FINDINGS: Mild hyperinflation. Mild diffuse increased interstitial opacity. Stable cardiomediastinal silhouette. No pneumothorax. Subsegmental atelectasis in the right perihilar region. IMPRESSION: Mild diffuse increased interstitial opacity, suspect acute interstitial inflammation or minimal edema on chronic interstitial changes. Electronically Signed   By: Donavan Foil M.D.   On: 01/30/2018 03:49    Microbiology: Recent Results (from the past 240 hour(s))  STREP GROUP A AG, W/REFLEX TO CULT     Status: None   Collection Time: 02/01/18 10:57 AM  Result Value Ref Range Status   Streptococcus, Group A Screen (Direct) NONE DETECTED  Final  Culture, Group A Strep     Status: None   Collection Time: 02/01/18 10:57 AM  Result Value Ref Range Status   MICRO NUMBER: [id number]  Final   SPECIMEN QUALITY: ADEQUATE  Final   SOURCE: NOT GIVEN  Final   STATUS: FINAL  Final   RESULT: No group A Streptococcus isolated  Final  MRSA PCR Screening     Status: None   Collection Time: 02/01/18 10:40 PM  Result Value Ref Range Status   MRSA by PCR NEGATIVE NEGATIVE Final    Comment:        The GeneXpert MRSA Assay (FDA approved for NASAL specimens only), is one component of a comprehensive MRSA colonization surveillance program. It is not intended to diagnose MRSA infection nor to guide or monitor treatment for MRSA infections. Performed at [facility], [address] [place], [place] [address]   Culture, blood (Routine X 2) w Reflex to ID Panel     Status: None (Preliminary result)   Collection Time: 02/01/18 11:47 PM  Result Value Ref Range Status   Specimen Description BLOOD RIGHT ARM   Final   Special Requests   Final    BOTTLES DRAWN AEROBIC AND ANAEROBIC Blood Culture adequate volume   Culture   Final    NO GROWTH 2 DAYS Performed at [facility], [address] [place], [place] [address]    Report Status PENDING  Incomplete  Culture, blood (Routine X 2) w Reflex to ID Panel     Status: None (Preliminary result)   Collection Time: 02/02/18 12:00 AM  Result Value Ref Range Status   Specimen Description BLOOD RIGHT HAND  Final   Special Requests   Final    BOTTLES DRAWN AEROBIC AND ANAEROBIC Blood Culture adequate volume   Culture   Final    NO GROWTH 2 DAYS Performed at [facility], [address], [place], [place] [address]    Report Status PENDING  Incomplete     Labs: Basic Metabolic Panel: Recent Labs  Lab 02/01/18 1208 02/01/18 1808 02/01/18 2347 02/02/18 0545 02/03/18 0503 02/03/18 1200 02/03/18 1705  NA 128* 125* 132* 135 132*  --   --   K 5.6* 5.8* 4.6 4.7 5.3*  --   --   CL 87* 84* 93* 96* 94*  --   --   CO2 32 '26 26 27 28  '$ --   --   GLUCOSE 612* 848* 604* 368* 574* 669* 491*  BUN 80* 84* 78* 78* 68*  --   --   CREATININE 1.74* 2.06* 1.98* 1.94* 1.80*  --   --   CALCIUM 9.6 8.8* 9.0 8.7* 8.4*  --   --    Liver Function Tests: Recent Labs  Lab 02/02/18 0545  AST 21  ALT 22  ALKPHOS 56  BILITOT 0.7  PROT 6.3*  ALBUMIN 3.5   CBC: Recent Labs  Lab 01/30/18 0343 02/01/18 1208 02/01/18 1808 02/02/18 0545  WBC 7.6 17.3* 13.7* 10.6*  NEUTROABS 4.1 15,016*  --   --   HGB 9.8* 9.9* 9.4* 9.0*  HCT 33.6* 33.2* 31.6* 29.1*  MCV 75.2* 75.1* 76.9* 74.6*  PLT 374 462* 403* 354   Cardiac Enzymes: Recent Labs  Lab 01/30/18 0343 02/01/18 1808  TROPONINI <0.03 0.22*   BNP: BNP (last 3 results) Recent Labs    09/25/17 1106 01/30/18 0357 02/01/18 1808  BNP 142.0* 39.0 202.0*    CBG: Recent Labs  Lab 02/03/18 1126 02/03/18 1601 02/03/18 2145 02/04/18  0740 02/04/18 1059  GLUCAP >600* 527* 383* 326* 381*      Signed:  Barton Dubois MD.  [facility] 02/04/2018, 11:38 AM

## 2018-02-04 NOTE — Care Management Note (Signed)
Case Management Note  Patient Details  Name: Edward [name] MRN: [id number] Date of Birth: [birth year]     Expected Discharge Date:  02/04/2018             Expected Discharge Plan:  [place]  In-House Referral:     Discharge planning Services  CM Consult  Post Acute Care Choice:  Home Health Choice offered to:  Patient  DME Arranged:    DME Agency:     HH Arranged:  RN [place] Agency:  [place]  Status of Service:  Completed, signed off  If discussed at [place] of Stay Meetings, dates discussed:    Additional Comments: Patient discharging home today. Will need HH RN for follow up teaching in regards to his Diabetes and Levemir. Patient is ind with ADL's. Has continuous oxygen provided by [employer]. Patient reports his son will bring a tank for transport home. Patient agreeable to [facility] RN with [facility] Juliann Pulse of [facility] notified and will obtain orders via Epic.   [name], Chauncey [place], RN 02/04/2018, 11:17 AM

## 2018-02-04 NOTE — Progress Notes (Addendum)
Inpatient Diabetes Program Recommendations  AACE/ADA: New Consensus Statement on Inpatient Glycemic Control (2015)  Target Ranges:  Prepandial:   less than 140 mg/dL      Peak postprandial:   less than 180 mg/dL (1-2 hours)      Critically ill patients:  140 - 180 mg/dL  Results for Edward [name], Edward [name] (MRN [id number]) as of 02/04/2018 07:40  Ref. Range 02/02/2018 12:19 02/02/2018 13:26 02/02/2018 14:32 02/02/2018 15:34 02/02/2018 16:52 02/02/2018 21:09 02/03/2018 07:49 02/03/2018 11:26 02/03/2018 16:01 02/03/2018 21:45  Glucose-Capillary Latest Ref Range: 65 - 99 mg/dL 327 (H) 294 (H) 270 (H) 230 (H) 216 (H) 463 (H) 597 (HH) >600 (HH) 527 (HH) 383 (H)   Results for Edward [name], Edward [name] (MRN [id number]) as of 02/04/2018 07:40  Ref. Range 09/12/2017 16:08 09/26/2017 06:52 02/01/2018 12:08  Hemoglobin A1C Latest Ref Range: <5.7 % of total Hgb 12.2 (H) 11.0 (H) 13.9 (H)   Review of Glycemic Control  Diabetes history: DM2 Outpatient Diabetes medications: NPH 30 units BID Current orders for Inpatient glycemic control: Levemir 30 units BID, Novolog 0-20 units TID with meals, Novolog 0-5 units QHS, Novolog 7 units TID with meals for meal coverage  Inpatient Diabetes Program Recommendations: Insulin-Basal: Since steroids have been decreased and changed to PO, anticipate glucose to improve.  However, if glucose continues to be consistently greater than 200 mg/dl, please consider increasing Levemir to 35 units BID. HgbA1C: A1C 13.9% on 02/01/2018 indicating an average glucose of 352 mg/dl over the past 2-3 months.  NOTE: In reviewing chart, noted patient was ordered Solumedrol which has been discontinued and patient is now ordered Prednisone 40 mg QAM. A1C 13.9% on 02/01/18 indicating very poor DM control.  Will plan to talk with patient today.  Addendum 02/04/18@11 :45-Spoke with patient and his son about diabetes and home regimen for diabetes control. Patient reports that he is followed by PCP for diabetes management and currently he takes  30 units BID of insulin but not able to verbalize name of insulin. Patient admits that he is NOT consistently taking insulin as prescribed. In further discussing consistently taking insulin patient reports that he can NOT see the insulin syringe well and he frequently bends the insulin needle when trying to draw up the insulin and when injecting insulin. Inquired about insulin pens and patient states that his copay for insulin pens is over $200 and he can not afford copay. Patient gets Novolin NPH from [facility] for $25 per vial and he is able to afford the insulin vials.  Patient states that he administers insulin in his abdomen mostly. Examined abdominal area patient reports he is using for insulin injections and no skin issues noted at injection sites.  Patient reports that he recently got a new glucometer and his glucose is usually in the upper 200's mg/dl when he has checked it recently.  Inquired about prior A1C and patient reports that he does not know what an A1C is.  Explained what an A1C is and discussed A1C results (13.9% on 02/01/18) and explained that his current A1C indicates an average glucose of 352 mg/dl over the past 2-3 months. Discussed glucose and A1C goals. Discussed importance of checking CBGs and maintaining good CBG control to prevent long-term and short-term complications. Explained how hyperglycemia leads to damage within blood vessels which lead to the common complications seen with uncontrolled diabetes. Stressed to the patient the importance of improving glycemic control to prevent further complications from uncontrolled diabetes. Patient confirms symptoms of hyperglycemia (blurry vision, frequent urination,  excessive thirst, and increased tiredness). Explained that if he can improve DM control, the symptoms he is experiencing from hyperglycemia will improve and he will likely feel better.  Explained that he likely needs to have family help with drawing up insulin and with injections.  Patient's son states that he does not like needles and he doesn't think he can help with giving insulin. Asked if he could at least draw up insulin in the syringe and watch patient administer the insulin to himself to be sure he is getting the insulin. Patient's son is agreeable to draw up insulin with vial/syringe. Explained and demonstrated proper technique on how to draw up insulin with vial and syringe. Showed patient's son how to draw up air, inject air into vial, and draw up insulin. Patient's son demonstrated competency with drawing up insulin via vial and syringe.   Encouraged patient to check his glucose as his PCP prescribes and asked that he take glucometer with him to doctor appointments. Also encouraged patient to allow family to help with drawing up insulin and with injections if they are able to help.  Patient verbalized understanding of information discussed and he states that he has no further questions at this time related to diabetes.   Thanks, Barnie Alderman, RN, MSN, CDE Diabetes Coordinator Inpatient Diabetes Program [phone] (Team Pager from 8am to 5pm)

## 2018-02-04 NOTE — Plan of Care (Signed)
  Problem: Acute Rehab PT Goals(only PT should resolve) Goal: Pt Will Go Supine/Side To Sit Outcome: Progressing Flowsheets (Taken 02/04/2018 1108) Pt will go Supine/Side to Sit: Independently Goal: Patient Will Transfer Sit To/From Stand Outcome: Progressing Flowsheets (Taken 02/04/2018 1108) Patient will transfer sit to/from stand: Independently Goal: Pt Will Transfer Bed To Chair/Chair To Bed Outcome: Progressing Flowsheets (Taken 02/04/2018 1108) Pt will Transfer Bed to Chair/Chair to Bed: Independently Goal: Pt Will Ambulate Outcome: Progressing Flowsheets (Taken 02/04/2018 1108) Pt will Ambulate: > 125 feet;with modified independence  11:09 AM, 02/04/18 Lonell Grandchild, MPT Physical Therapist with [facility] [phone] office [phone] mobile phone

## 2018-02-04 NOTE — Discharge Instructions (Signed)
Acute Respiratory Failure, Adult Acute respiratory failure occurs when there is not enough oxygen passing from your lungs to your body. When this happens, your lungs have trouble removing carbon dioxide from the blood. This causes your blood oxygen level to drop too low as carbon dioxide builds up. Acute respiratory failure is a medical emergency. It can develop quickly, but it is temporary if treated promptly. Your lung capacity, or how much air your lungs can hold, may improve with time, exercise, and treatment. What are the causes? There are many possible causes of acute respiratory failure, including:  Lung injury.  Chest injury or damage to the ribs or tissues near the lungs.  Lung conditions that affect the flow of air and blood into and out of the lungs, such as pneumonia, acute respiratory distress syndrome, and cystic fibrosis.  Medical conditions, such as strokes or spinal cord injuries, that affect the muscles and nerves that control breathing.  Blood infection (sepsis).  Inflammation of the pancreas (pancreatitis).  A blood clot in the lungs (pulmonary embolism).  A large-volume blood transfusion.  Burns.  Near-drowning.  Seizure.  Smoke inhalation.  Reaction to medicines.  Alcohol or drug overdose.  What increases the risk? This condition is more likely to develop in people who have:  A blocked airway.  Asthma.  A condition or disease that damages or weakens the muscles, nerves, bones, or tissues that are involved in breathing.  A serious infection.  A health problem that blocks the unconscious reflex that is involved in breathing, such as hypothyroidism or sleep apnea.  A lung injury or trauma.  What are the signs or symptoms? Trouble breathing is the main symptom of acute respiratory failure. Symptoms may also include:  Rapid breathing.  Restlessness or anxiety.  Skin, lips, or fingernails that appear blue (cyanosis).  Rapid heart  rate.  Abnormal heart rhythms (arrhythmias).  Confusion or changes in behavior.  Tiredness or loss of energy.  Feeling sleepy or having a loss of consciousness.  How is this diagnosed? Your health care provider can diagnose acute respiratory failure with a medical history and physical exam. During the exam, your health care provider will listen to your heart and check for crackling or wheezing sounds in your lungs. Your may also have tests to confirm the diagnosis and determine what is causing respiratory failure. These tests may include:  Measuring the amount of oxygen in your blood (pulse oximetry). The measurement comes from a small device that is placed on your finger, earlobe, or toe.  Other blood tests to measure blood gases and to look for signs of infection.  Sampling your cerebral spinal fluid or tracheal fluid to check for infections.  Chest X-ray to look for fluid in spaces that should be filled with air.  Electrocardiogram (ECG) to look at the heart's electrical activity.  How is this treated? Treatment for this condition usually takes places in a hospital intensive care unit (ICU). Treatment depends on what is causing the condition. It may include one or more treatments until your symptoms improve. Treatment may include:  Supplemental oxygen. Extra oxygen is given through a tube in the nose, a face mask, or a hood.  A device such as a continuous positive airway pressure (CPAP) or bi-level positive airway pressure (BiPAP or BPAP) machine. This treatment uses mild air pressure to keep the airways open. A mask or other device will be placed over your nose or mouth. A tube that is connected to a motor will deliver  oxygen through the mask.  Ventilator. This treatment helps move air into and out of the lungs. This may be done with a bag and mask or a machine. For this treatment, a tube is placed in your windpipe (trachea) so air and oxygen can flow to the lungs.  Extracorporeal  membrane oxygenation (ECMO). This treatment temporarily takes over the function of the heart and lungs, supplying oxygen and removing carbon dioxide. ECMO gives the lungs a chance to recover. It may be used if a ventilator is not effective.  Tracheostomy. This is a procedure that creates a hole in the neck to insert a breathing tube.  Receiving fluids and medicines.  Rocking the bed to help breathing.  Follow these instructions at home:  Take over-the-counter and prescription medicines only as told by your health care provider.  Return to normal activities as told by your health care provider. Ask your health care provider what activities are safe for you.  Keep all follow-up visits as told by your health care provider. This is important. How is this prevented? Treating infections and medical conditions that may lead to acute respiratory failure can help prevent the condition from developing. Contact a health care provider if:  You have a fever.  Your symptoms do not improve or they get worse. Get help right away if:  You are having trouble breathing.  You lose consciousness.  Your have cyanosis or turn blue.  You develop a rapid heart rate.  You are confused. These symptoms may represent a serious problem that is an emergency. Do not wait to see if the symptoms will go away. Get medical help right away. Call your local emergency services (911 in the [place] Do not drive yourself to the hospital. This information is not intended to replace advice given to you by your health care provider. Make sure you discuss any questions you have with your health care provider. Document Released: 09/23/2013 Document Revised: 04/15/2016 Document Reviewed: 04/05/2016 Elsevier Interactive Patient Education  2018 [employer]   Chronic Obstructive Pulmonary Disease Chronic obstructive pulmonary disease (COPD) is a long-term (chronic) lung problem. When you have COPD, it is hard for air to get  in and out of your lungs. The way your lungs work will never return to normal. Usually the condition gets worse over time. There are things you can do to keep yourself as healthy as possible. Your doctor may treat your condition with:  Medicines.  Quitting smoking, if you smoke.  Rehabilitation. This may involve a team of specialists.  Oxygen.  Exercise and changes to your diet.  Lung surgery.  Comfort measures (palliative care).  Follow these instructions at home: Medicines  Take over-the-counter and prescription medicines only as told by your doctor.  Talk to your doctor before taking any cough or allergy medicines. You may need to avoid medicines that cause your lungs to be dry. Lifestyle  If you smoke, stop. Smoking makes the problem worse. If you need help quitting, ask your doctor.  Avoid being around things that make your breathing worse. This may include smoke, chemicals, and fumes.  Stay active, but remember to also rest.  Learn and use tips on how to relax.  Make sure you get enough sleep. Most adults need at least 7 hours a night.  Eat healthy foods. Eat smaller meals more often. Rest before meals. Controlled breathing  Learn and use tips on how to control your breathing as told by your doctor. Try: ? Breathing in (inhaling) through  your nose for 1 second. Then, pucker your lips and breath out (exhale) through your lips for 2 seconds. ? Putting one hand on your belly (abdomen). Breathe in slowly through your nose for 1 second. Your hand on your belly should move out. Pucker your lips and breathe out slowly through your lips. Your hand on your belly should move in as you breathe out. Controlled coughing  Learn and use controlled coughing to clear mucus from your lungs. The steps are: 1. Lean your head a little forward. 2. Breathe in deeply. 3. Try to hold your breath for 3 seconds. 4. Keep your mouth slightly open while coughing 2 times. 5. Spit any mucus out  into a tissue. 6. Rest and do the steps again 1 or 2 times as needed. General instructions  Make sure you get all the shots (vaccines) that your doctor recommends. Ask your doctor about a flu shot and a pneumonia shot.  Use oxygen therapy and therapy to help improve your lungs (pulmonary rehabilitation) if told by your doctor. If you need home oxygen therapy, ask your doctor if you should buy a tool to measure your oxygen level (oximeter).  Make a COPD action plan with your doctor. This helps you know what to do if you feel worse than usual.  Manage any other conditions you have as told by your doctor.  Avoid going outside when it is very hot, cold, or humid.  Avoid people who have a sickness you can catch (contagious).  Keep all follow-up visits as told by your doctor. This is important. Contact a doctor if:  You cough up more mucus than usual.  There is a change in the color or thickness of the mucus.  It is harder to breathe than usual.  Your breathing is faster than usual.  You have trouble sleeping.  You need to use your medicines more often than usual.  You have trouble doing your normal activities such as getting dressed or walking around the house. Get help right away if:  You have shortness of breath while resting.  You have shortness of breath that stops you from: ? Being able to talk. ? Doing normal activities.  Your chest hurts for longer than 5 minutes.  Your skin color is more blue than usual.  Your pulse oximeter shows that you have low oxygen for longer than 5 minutes.  You have a fever.  You feel too tired to breathe normally. Summary  Chronic obstructive pulmonary disease (COPD) is a long-term lung problem.  The way your lungs work will never return to normal. Usually the condition gets worse over time. There are things you can do to keep yourself as healthy as possible.  Take over-the-counter and prescription medicines only as told by your  doctor.  If you smoke, stop. Smoking makes the problem worse. This information is not intended to replace advice given to you by your health care provider. Make sure you discuss any questions you have with your health care provider. Document Released: 03/06/2008 Document Revised: 02/24/2016 Document Reviewed: 05/15/2013 Elsevier Interactive Patient Education  2017 [place].   Diabetes Mellitus and Sick Day Management Blood sugar (glucose) can be difficult to control when you are sick. Common illnesses that can cause problems for people with diabetes (diabetes mellitus) include colds, fever, flu (influenza), nausea, vomiting, and diarrhea. These illnesses can cause stress and loss of body fluids (dehydration), and those issues can cause blood glucose levels to increase. Because of this, it is  very important to take your insulin and diabetes medicines and eat some form of carbohydrate when you are sick. You should make a plan for days when you are sick (sick day plan) as part of your diabetes management plan. You and your health care provider should make this plan in advance. The following guidelines are intended to help you manage an illness that lasts for about 24 hours or less. Your health care provider may also give you more specific instructions. What do I need to do to manage my blood glucose?  Check your blood glucose every 2-4 hours, or as often as told by your health care provider.  Know your sick day treatment goals. Your target blood glucose levels may be different when you are sick.  If you use insulin, take your usual dose. ? If your blood glucose continues to be too high, you may need to take an additional insulin dose as told by your health care provider.  If you use oral diabetes medicine, you may need to stop taking it if you are not able to eat or drink normally. Ask your health care provider about whether you need to stop taking these medicines while you are sick.  If you  use injectable hormone medicines other than insulin to control your diabetes, ask your health care provider about whether you need to stop taking these medicines while you are sick. What else can I do to manage my diabetes when I am sick? Check your ketones  If you have type 1 diabetes, check your urine ketones every 4 hours.  If you have type 2 diabetes, check your urine ketones as often as told by your health care provider. Drink fluids  Drink enough fluid to keep your urine clear or pale yellow. This is especially important if you have a fever, vomiting, or diarrhea. Those symptoms can lead to dehydration.  Follow any instructions from your health care provider about beverages to avoid. ? Do not drink alcohol, caffeine, or drinks that contain a lot of sugar. Take medicines as directed  Take-over-the-counter and prescription medicines only as told by your health care provider.  Check medicine labels for added sugars. Some medicines may contain sugar or types of sugars that can raise your blood glucose level. What foods can I eat when I am sick? You need to eat some form of carbohydrates when you are sick. You should eat 45-50 grams (45-50 g) of carbohydrates every 3-4 hours until you feel better. All of the food choices below contain about 15 g of carbohydrates. Plan ahead and keep some of these foods around so you have them if you get sick.  4-6 oz (120-177 mL) carbonated beverage that contains sugar, such as regular (not diet) soda. You may be able to drink carbonated beverages more easily if you open the beverage and let it sit at room temperature for a few minutes before drinking.   of a twin frozen ice pop.  4 oz (120 g) regular gelatin.  4 oz (120 mL) fruit juice.  4 oz (120 g) ice cream or frozen yogurt.  2 oz (60 g) sherbet.  8 oz (240 mL) clear broth or soup.  4 oz (120 g) regular custard.  4 oz (120 g) regular pudding.  8 oz (240 g) plain yogurt.  1 slice bread  or toast.  6 saltine crackers.  5 vanilla wafers.  Questions to ask your health care provider Consider asking the following questions so you know what to  do on days when you are sick:  Should I adjust my diabetes medicines?  How often do I need to check my blood glucose?  What supplies do I need to manage my diabetes at home when I am sick?  What number can I call if I have questions?  What foods and drinks should I avoid?  Contact a health care provider if:  You develop symptoms of diabetic ketoacidosis, such as: ? Fatigue. ? Weight loss. ? Excessive thirst. ? Light-headedness. ? Fruity or sweet-smelling breath. ? Excessive urination. ? Vision changes. ? Confusion or irritability. ? Nausea. ? Vomiting. ? Rapid breathing. ? Pain in the abdomen. ? Feeling flushed.  You are unable to drink fluids without vomiting.  You have any of the following for more than 6 hours: ? Nausea. ? Vomiting. ? Diarrhea.  Your blood glucose is at or above 240 mg/dL (13.3 mmol/L), even after you take an additional insulin dose.  You have a change in how you think, feel, or act (mental status).  You develop another serious illness.  You have been sick or have had a fever for 2 days or longer and you are not getting better. Get help right away if:  Your blood glucose is lower than 54 mg/dL (3.0 mmol/L).  You have difficulty breathing.  You have moderate or high ketone levels in your urine.  You used emergency glucagon to treat low blood glucose. Summary  Blood sugar (glucose) can be difficult to control when you are sick. Common illnesses that can cause problems for people with diabetes (diabetes mellitus) include colds, fever, flu (influenza), nausea, vomiting, and diarrhea.  Illnesses can cause stress and loss of body fluids (dehydration), and those issues can cause blood glucose levels to increase.  Make a plan for days when you are sick (sick day plan) as part of your  diabetes management plan. You and your health care provider should make this plan in advance.  It is very important to take your insulin and diabetes medicines and to eat some form of carbohydrate when you are sick.  Contact your health care provider if have problems managing your blood glucose levels when you are sick, or if you have been sick or had a fever for 2 days or longer and are not getting better. This information is not intended to replace advice given to you by your health care provider. Make sure you discuss any questions you have with your health care provider. Document Released: 09/21/2003 Document Revised: 06/16/2016 Document Reviewed: 06/16/2016 Elsevier Interactive Patient Education  2018 [employer].   Diabetes Mellitus and Nutrition When you have diabetes (diabetes mellitus), it is very important to have healthy eating habits because your blood sugar (glucose) levels are greatly affected by what you eat and drink. Eating healthy foods in the appropriate amounts, at about the same times every day, can help you:  Control your blood glucose.  Lower your risk of heart disease.  Improve your blood pressure.  Reach or maintain a healthy weight.  Every person with diabetes is different, and each person has different needs for a meal plan. Your health care provider may recommend that you work with a diet and nutrition specialist (dietitian) to make a meal plan that is best for you. Your meal plan may vary depending on factors such as:  The calories you need.  The medicines you take.  Your weight.  Your blood glucose, blood pressure, and cholesterol levels.  Your activity level.  Other health  conditions you have, such as heart or kidney disease.  How do carbohydrates affect me? Carbohydrates affect your blood glucose level more than any other type of food. Eating carbohydrates naturally increases the amount of glucose in your blood. Carbohydrate counting is a method  for keeping track of how many carbohydrates you eat. Counting carbohydrates is important to keep your blood glucose at a healthy level, especially if you use insulin or take certain oral diabetes medicines. It is important to know how many carbohydrates you can safely have in each meal. This is different for every person. Your dietitian can help you calculate how many carbohydrates you should have at each meal and for snack. Foods that contain carbohydrates include:  Bread, cereal, rice, pasta, and crackers.  Potatoes and corn.  Peas, beans, and lentils.  Milk and yogurt.  Fruit and juice.  Desserts, such as cakes, cookies, ice cream, and candy.  How does alcohol affect me? Alcohol can cause a sudden decrease in blood glucose (hypoglycemia), especially if you use insulin or take certain oral diabetes medicines. Hypoglycemia can be a life-threatening condition. Symptoms of hypoglycemia (sleepiness, dizziness, and confusion) are similar to symptoms of having too much alcohol. If your health care provider says that alcohol is safe for you, follow these guidelines:  Limit alcohol intake to no more than 1 drink per day for nonpregnant women and 2 drinks per day for men. One drink equals 12 oz of beer, 5 oz of wine, or 1 oz of hard liquor.  Do not drink on an empty stomach.  Keep yourself hydrated with water, diet soda, or unsweetened iced tea.  Keep in mind that regular soda, juice, and other mixers may contain a lot of sugar and must be counted as carbohydrates.  What are tips for following this plan? Reading food labels  Start by checking the serving size on the label. The amount of calories, carbohydrates, fats, and other nutrients listed on the label are based on one serving of the food. Many foods contain more than one serving per package.  Check the total grams (g) of carbohydrates in one serving. You can calculate the number of servings of carbohydrates in one serving by dividing  the total carbohydrates by 15. For example, if a food has 30 g of total carbohydrates, it would be equal to 2 servings of carbohydrates.  Check the number of grams (g) of saturated and trans fats in one serving. Choose foods that have low or no amount of these fats.  Check the number of milligrams (mg) of sodium in one serving. Most people should limit total sodium intake to less than 2,300 mg per day.  Always check the nutrition information of foods labeled as "low-fat" or "nonfat". These foods may be higher in added sugar or refined carbohydrates and should be avoided.  Talk to your dietitian to identify your daily goals for nutrients listed on the label. Shopping  Avoid buying canned, premade, or processed foods. These foods tend to be high in fat, sodium, and added sugar.  Shop around the outside edge of the grocery store. This includes fresh fruits and vegetables, bulk grains, fresh meats, and fresh dairy. Cooking  Use low-heat cooking methods, such as baking, instead of high-heat cooking methods like deep frying.  Cook using healthy oils, such as olive, canola, or sunflower oil.  Avoid cooking with butter, cream, or high-fat meats. Meal planning  Eat meals and snacks regularly, preferably at the same times every day. Avoid going long  periods of time without eating.  Eat foods high in fiber, such as fresh fruits, vegetables, beans, and whole grains. Talk to your dietitian about how many servings of carbohydrates you can eat at each meal.  Eat 4-6 ounces of lean protein each day, such as lean meat, chicken, fish, eggs, or tofu. 1 ounce is equal to 1 ounce of meat, chicken, or fish, 1 egg, or 1/4 cup of tofu.  Eat some foods each day that contain healthy fats, such as avocado, nuts, seeds, and fish. Lifestyle   Check your blood glucose regularly.  Exercise at least 30 minutes 5 or more days each week, or as told by your health care provider.  Take medicines as told by your  health care provider.  Do not use any products that contain nicotine or tobacco, such as cigarettes and e-cigarettes. If you need help quitting, ask your health care provider.  Work with a Social worker or diabetes educator to identify strategies to manage stress and any emotional and social challenges. What are some questions to ask my health care provider?  Do I need to meet with a diabetes educator?  Do I need to meet with a dietitian?  What number can I call if I have questions?  When are the best times to check my blood glucose? Where to find more information:  American Diabetes Association: diabetes.org/food-and-fitness/food  Academy of Nutrition and Dietetics: PokerClues.dk  [employer] of Diabetes and Digestive and Kidney Diseases (NIH): ContactWire.be Summary  A healthy meal plan will help you control your blood glucose and maintain a healthy lifestyle.  Working with a diet and nutrition specialist (dietitian) can help you make a meal plan that is best for you.  Keep in mind that carbohydrates and alcohol have immediate effects on your blood glucose levels. It is important to count carbohydrates and to use alcohol carefully. This information is not intended to replace advice given to you by your health care provider. Make sure you discuss any questions you have with your health care provider. Document Released: 06/15/2005 Document Revised: 10/23/2016 Document Reviewed: 10/23/2016 Elsevier Interactive Patient Education  2018 [place].   Insulin Resistance Insulin is a hormone that helps to control blood sugar (glucose) levels in the body. It is made in the pancreas. Insulin allows glucose to enter cells in the body. Insulin sensitivity refers to how the body responds to insulin. Insulin resistance occurs when cells in the body do not respond  properly to insulin made by the pancreas and are not able to absorb glucose from the bloodstream. Insulin resistance results in high blood glucose levels (hyperglycemia) and can lead to problems, including:  Prediabetes.  Type 2 diabetes (type 2 diabetes mellitus).  Heart disease.  High blood pressure (hypertension).  Stroke.  Polycystic ovarian syndrome (PCOS).  Nonalcoholic fatty liver disease.  What are the causes? The exact cause of insulin resistance is not known. What increases the risk? The following factors may make you more likely to develop insulin resistance:  Being overweight or obese, especially if a lot of your weight is in your waist area.  Having an inactive (sedentary) lifestyle.  Using steroids.  Being older than 36.  Having sleep apnea.  Using tobacco products.  What are the signs or symptoms? This condition usually does not cause symptoms. How is this diagnosed? There is no test to diagnose insulin resistance. However, your health care provider may diagnose insulin resistance based on:  Your blood glucose levels.  Your cholesterol levels.  A measurement of the distance around your waist (circumference). A waist circumference of more than 35 inches (88.9 cm) for women and more than 40 inches (101.6 cm) for men may be a sign of insulin resistance.  Your risk factors.  A physical exam.  Your medical history.  How is this treated? Insulin resistance is treated with nutrition and lifestyle changes. These changes may include:  Eating a healthy balance of nutritious foods.  Getting more physical activity.  Maintaining a healthy weight.  Stopping the use of any tobacco products.  Your health care provider will work with you to change your nutrition and lifestyle as needed. In some cases, treatment may also include medicine to improve your insulin sensitivity. Follow these instructions at home:  Be physically active. ? Do moderate-intensity  physical activity for at least 30 minutes on at least 5 days of the week, or as much as told by your health care provider. This could be brisk walking, biking, or water aerobics. ? Ask your health care provider what activities are safe for you. A mix of physical activities may be best, such as walking, swimming, cycling, and strength training.  Lose weight as told by your health care provider. ? Losing 5-7% of your body weight can reverse insulin resistance. ? Your health care provider can determine how much weight loss is best for you and can help you lose weight safely.  Follow a healthy meal plan. This includes eating lean proteins, complex carbohydrates, fresh fruits and vegetables, low-fat dairy products, and healthy fats. ? Follow instructions from your health care provider about eating or drinking restrictions. ? Make an appointment to see a diet and nutrition specialist (registered dietitian) to help you create a healthy eating plan.  Check your blood glucose levels as told by your health care provider.  Take over-the-counter and prescription medicines only as told by your health care provider.  Do not use any tobacco products, such as cigarettes, chewing tobacco, and e-cigarettes. If you need help quitting, ask your health care provider.  Keep all follow-up visits as told by your health care provider. This is important. Contact a health care provider if:  You have trouble losing weight or maintaining your goal weight.  You gain weight.  You have trouble following your prescribed meal plan.  You have trouble exercising more. This information is not intended to replace advice given to you by your health care provider. Make sure you discuss any questions you have with your health care provider. Document Released: 11/07/2005 Document Revised: 02/24/2016 Document Reviewed: 10/22/2015 Elsevier Interactive Patient Education  2018 [employer].   Type 2 Diabetes Mellitus, Self Care,  Adult When you have type 2 diabetes (type 2 diabetes mellitus), you must keep your blood sugar (glucose) under control. You can do this with:  Nutrition.  Exercise.  Lifestyle changes.  Medicines or insulin, if needed.  Support from your doctors and others.  How do I manage my blood sugar?  Check your blood sugar level every day, as often as told.  Call your doctor if your blood sugar is above your goal numbers for 2 tests in a row.  Have your A1c (hemoglobin A1c) level checked at least two times a year. Have it checked more often if your doctor tells you to. Your doctor will set treatment goals for you. Generally, you should have these blood sugar levels:  Before meals (preprandial): 80-130 mg/dL (4.4-7.2 mmol/L).  After meals (postprandial): lower than 180 mg/dL (10 mmol/L).  A1c  level: less than 7%.  What do I need to know about high blood sugar? High blood sugar is called hyperglycemia. Know the signs of high blood sugar. Signs may include:  Feeling: ? Thirsty. ? Hungry. ? Very tired.  Needing to pee (urinate) more than usual.  Blurry vision.  What do I need to know about low blood sugar? Low blood sugar is called hypoglycemia. This is when blood sugar is at or below 70 mg/dL (3.9 mmol/L). Symptoms may include:  Feeling: ? Hungry. ? Worried or nervous (anxious). ? Sweaty and clammy. ? Confused. ? Dizzy. ? Sleepy. ? Sick to your stomach (nauseous).  Having: ? A fast heartbeat (palpitations). ? A headache. ? A change in your vision. ? Jerky movements that you cannot control (seizure). ? Nightmares. ? Tingling or no feeling (numbness) around the mouth, lips, or tongue.  Having trouble with: ? Talking. ? Paying attention (concentrating). ? Moving (coordination). ? Sleeping.  Shaking.  Passing out (fainting).  Getting upset easily (irritability).  Treating low blood sugar  To treat low blood sugar, eat or drink something sugary right away. If  you can think clearly and swallow safely, follow the 15:15 rule:  Take 15 grams of a fast-acting carb (carbohydrate). Some fast-acting carbs are: ? 1 tube of glucose gel. ? 3 sugar tablets (glucose pills). ? 6-8 pieces of hard candy. ? 4 oz (120 mL) of fruit juice. ? 4 oz (120 mL) regular (not diet) soda.  Check your blood sugar 15 minutes after you take the carb.  If your blood sugar is still at or below 70 mg/dL (3.9 mmol/L), take 15 grams of a carb again.  If your blood sugar does not go above 70 mg/dL (3.9 mmol/L) after 3 tries, get help right away.  After your blood sugar goes back to normal, eat a meal or a snack within 1 hour.  Treating very low blood sugar If your blood sugar is at or below 54 mg/dL (3 mmol/L), you have very low blood sugar (severe hypoglycemia). This is an emergency. Do not wait to see if the symptoms will go away. Get medical help right away. Call your local emergency services (911 in the [place] Do not drive yourself to the hospital. If you have very low blood sugar and you cannot eat or drink, you may need a glucagon shot (injection). A family member or friend should learn how to check your blood sugar and how to give you a glucagon shot. Ask your doctor if you need to have a glucagon shot kit at home. What else is important to manage my diabetes? Medicine Follow these instructions about insulin and diabetes medicines:  Take them as told by your doctor.  Adjust them as told by your doctor.  Do not run out of them.  Having diabetes can raise your risk for other long-term conditions. These include heart or kidney disease. Your doctor may prescribe medicines to help prevent problems from diabetes. Food   Make healthy food choices. These include: ? Chicken, fish, egg whites, and beans. ? Oats, whole wheat, bulgur, brown rice, quinoa, and millet. ? Fresh fruits and vegetables. ? Low-fat dairy products. ? Nuts, avocado, olive oil, and canola oil.  Make a  food plan with a specialist (dietitian).  Follow instructions from your doctor about what you cannot eat or drink.  Drink enough fluid to keep your pee (urine) clear or pale yellow.  Eat healthy snacks between healthy meals.  Keep track of carbs that  you eat. Read food labels. Learn food serving sizes.  Follow your sick day plan when you cannot eat or drink normally. Make this plan with your doctor so it is ready to use. Activity  Exercise at least 3 times a week.  Do not go more than 2 days without exercising.  Talk with your doctor before you start a new exercise. Your doctor may need to adjust your insulin, medicines, or food. Lifestyle   Do not use any tobacco products. These include cigarettes, chewing tobacco, and e-cigarettes.If you need help quitting, ask your doctor.  Ask your doctor how much alcohol is safe for you.  Learn to deal with stress. If you need help with this, ask your doctor. Body care  Stay up to date with your shots (immunizations).  Have your eyes and feet checked by a doctor as often as told.  Check your skin and feet every day. Check for cuts, bruises, redness, blisters, or sores.  Brush your teeth and gums two times a day.  Floss at least one time a day.  Go to the dentist least one time every 6 months.  Stay at a healthy weight. General instructions   Take over-the-counter and prescription medicines only as told by your doctor.  Share your diabetes care plan with: ? Your work or school. ? People you live with.  Check your pee (urine) for ketones: ? When you are sick. ? As told by your doctor.  Carry a card or wear jewelry that says that you have diabetes.  Ask your doctor: ? Do I need to meet with a diabetes educator? ? Where can I find a support group for people with diabetes?  Keep all follow-up visits as told by your doctor. This is important. Where to find more information: To learn more about diabetes, visit:  American  Diabetes Association: www.diabetes.org  American Association of Diabetes Educators: www.diabeteseducator.org/patient-resources  This information is not intended to replace advice given to you by your health care provider. Make sure you discuss any questions you have with your health care provider. Document Released: 01/10/2016 Document Revised: 02/24/2016 Document Reviewed: 10/22/2015 Elsevier Interactive Patient Education  [employer].

## 2018-02-04 NOTE — Evaluation (Signed)
Physical Therapy Evaluation Patient Details Name: Edward [name] MRN: [id number] DOB: Jan 03, 1935 Today's Date: 02/04/2018   History of Present Illness  Edward [name]  is a 83 y.o. male, with history of COPD, chronic respiratory failure on 3 L oxygen by nasal cannula, pulmonary fibrosis, chronic diastolic CHF, diabetes mellitus, chronic kidney disease stage III, hypertension, atrial fibrillation who was sent to ED from PCP office after patient was found to be short of breath along with hyperglycemia.    Clinical Impression  Patient presents seated in chair (assisted by nursing staff), functioning near baseline for functional mobility and gait, demonstrates slightly unsteady gait without loss of balance, requires supervision for transferring from commode due to BLE weakness and tolerated staying up in chair after therapy - RN aware.  Patient will benefit from continued physical therapy in hospital and recommended venue below to increase strength, balance, endurance for safe ADLs and gait.     Follow Up Recommendations Home health PT    Equipment Recommendations  None recommended by PT    Recommendations for Other Services       Precautions / Restrictions Precautions Precautions: Fall Restrictions Weight Bearing Restrictions: No      Mobility  Bed Mobility Overal bed mobility: Modified Independent                Transfers Overall transfer level: Modified independent                  Ambulation/Gait Ambulation/Gait assistance: Supervision Ambulation Distance (Feet): 80 Feet Assistive device: None(pushing O2 tank holder) Gait Pattern/deviations: Decreased step length - right;Decreased step length - left;Decreased stride length Gait velocity: decreased   General Gait Details: slightly labored slower than normal cadence pushing O2 tank without loss of balance  Stairs            Wheelchair Mobility    Modified Rankin (Stroke Patients Only)       Balance  Overall balance assessment: Mild deficits observed, not formally tested                                           Pertinent Vitals/Pain Pain Assessment: No/denies pain    Home Living Family/patient expects to be discharged to:: Private residence Living Arrangements: Spouse/significant other;Children Available Help at Discharge: Available 24 hours/day;Family Type of Home: Mobile home Home Access: Stairs to enter Entrance Stairs-Rails: Can reach both;Right;Left Entrance Stairs-Number of Steps: 5 Home Layout: One level Home Equipment: Walker - 4 wheels;Bedside commode      Prior Function Level of Independence: Independent with assistive device(s)         Comments: ambulates household and community distances with occasional use of rollator     Hand Dominance        Extremity/Trunk Assessment   Upper Extremity Assessment Upper Extremity Assessment: Overall WFL for tasks assessed    Lower Extremity Assessment Lower Extremity Assessment: Generalized weakness       Communication   Communication: No difficulties  Cognition Arousal/Alertness: Awake/alert Behavior During Therapy: WFL for tasks assessed/performed Overall Cognitive Status: Within Functional Limits for tasks assessed                                        General Comments      Exercises     Assessment/Plan  PT Assessment Patient needs continued PT services  PT Problem List Decreased strength;Decreased activity tolerance;Decreased balance;Decreased mobility       PT Treatment Interventions Gait training;Stair training;Functional mobility training;Therapeutic activities;Therapeutic exercise;Patient/family education    PT Goals (Current goals can be found in the Care Plan section)  Acute Rehab PT Goals Patient Stated Goal: return home PT Goal Formulation: With patient Time For Goal Achievement: 02/08/18 Potential to Achieve Goals: Good    Frequency Min  3X/week   Barriers to discharge        Co-evaluation               AM-PAC PT "6 Clicks" Daily Activity  Outcome Measure Difficulty turning over in bed (including adjusting bedclothes, sheets and blankets)?: None Difficulty moving from lying on back to sitting on the side of the bed? : None Difficulty sitting down on and standing up from a chair with arms (e.g., wheelchair, bedside commode, etc,.)?: None Help needed moving to and from a bed to chair (including a wheelchair)?: None Help needed walking in hospital room?: A Little Help needed climbing 3-5 steps with a railing? : A Little 6 Click Score: 22    End of Session Equipment Utilized During Treatment: Oxygen Activity Tolerance: Patient tolerated treatment well;Patient limited by fatigue Patient left: in chair;with call bell/phone within reach Nurse Communication: Mobility status;Other (comment)(RN aware patient left up in chair) PT Visit Diagnosis: Unsteadiness on feet (R26.81);Other abnormalities of gait and mobility (R26.89);Muscle weakness (generalized) (M62.81)    Time: 6237-6283 PT Time Calculation (min) (ACUTE ONLY): 23 min   Charges:   PT Evaluation $PT Eval Moderate Complexity: 1 Mod PT Treatments $Therapeutic Activity: 23-37 mins   PT G Codes:        11:07 AM, Feb 24, 2018 Lonell Grandchild, MPT Physical Therapist with [facility] [phone] office [phone] mobile phone

## 2018-02-05 ENCOUNTER — Telehealth: Payer: Self-pay | Admitting: Physician Assistant

## 2018-02-05 DIAGNOSIS — J440 Chronic obstructive pulmonary disease with acute lower respiratory infection: Secondary | ICD-10-CM | POA: Diagnosis not present

## 2018-02-05 DIAGNOSIS — E785 Hyperlipidemia, unspecified: Secondary | ICD-10-CM | POA: Diagnosis not present

## 2018-02-05 DIAGNOSIS — J441 Chronic obstructive pulmonary disease with (acute) exacerbation: Secondary | ICD-10-CM | POA: Diagnosis not present

## 2018-02-05 DIAGNOSIS — I5032 Chronic diastolic (congestive) heart failure: Secondary | ICD-10-CM | POA: Diagnosis not present

## 2018-02-05 DIAGNOSIS — K219 Gastro-esophageal reflux disease without esophagitis: Secondary | ICD-10-CM | POA: Diagnosis not present

## 2018-02-05 DIAGNOSIS — I4891 Unspecified atrial fibrillation: Secondary | ICD-10-CM | POA: Diagnosis not present

## 2018-02-05 DIAGNOSIS — N183 Chronic kidney disease, stage 3 (moderate): Secondary | ICD-10-CM | POA: Diagnosis not present

## 2018-02-05 DIAGNOSIS — Z7901 Long term (current) use of anticoagulants: Secondary | ICD-10-CM | POA: Diagnosis not present

## 2018-02-05 DIAGNOSIS — J189 Pneumonia, unspecified organism: Secondary | ICD-10-CM | POA: Diagnosis not present

## 2018-02-05 DIAGNOSIS — E1165 Type 2 diabetes mellitus with hyperglycemia: Secondary | ICD-10-CM | POA: Diagnosis not present

## 2018-02-05 DIAGNOSIS — E1122 Type 2 diabetes mellitus with diabetic chronic kidney disease: Secondary | ICD-10-CM | POA: Diagnosis not present

## 2018-02-05 DIAGNOSIS — Z794 Long term (current) use of insulin: Secondary | ICD-10-CM | POA: Diagnosis not present

## 2018-02-05 DIAGNOSIS — Z7951 Long term (current) use of inhaled steroids: Secondary | ICD-10-CM | POA: Diagnosis not present

## 2018-02-05 DIAGNOSIS — Z9981 Dependence on supplemental oxygen: Secondary | ICD-10-CM | POA: Diagnosis not present

## 2018-02-05 DIAGNOSIS — I130 Hypertensive heart and chronic kidney disease with heart failure and stage 1 through stage 4 chronic kidney disease, or unspecified chronic kidney disease: Secondary | ICD-10-CM | POA: Diagnosis not present

## 2018-02-05 DIAGNOSIS — J9611 Chronic respiratory failure with hypoxia: Secondary | ICD-10-CM | POA: Diagnosis not present

## 2018-02-05 DIAGNOSIS — J8410 Pulmonary fibrosis, unspecified: Secondary | ICD-10-CM | POA: Diagnosis not present

## 2018-02-05 DIAGNOSIS — E559 Vitamin D deficiency, unspecified: Secondary | ICD-10-CM | POA: Diagnosis not present

## 2018-02-05 DIAGNOSIS — Z7982 Long term (current) use of aspirin: Secondary | ICD-10-CM | POA: Diagnosis not present

## 2018-02-05 MED ORDER — BUDESONIDE 0.5 MG/2ML IN SUSP: 0.5000 mg | Freq: Two times a day (BID) | RESPIRATORY_TRACT | 5 refills | Status: DC

## 2018-02-05 MED ORDER — HUMALOG JUNIOR KWIKPEN 100 UNIT/ML ~~LOC~~ SOPN
20.0000 [IU] | PEN_INJECTOR | Freq: Three times a day (TID) | SUBCUTANEOUS | 3 refills | Status: DC
Start: 2018-02-05 — End: 2018-02-20

## 2018-02-05 MED ORDER — IPRATROPIUM BROMIDE 0.02 % IN SOLN: 0.5000 mg | Freq: Four times a day (QID) | RESPIRATORY_TRACT | 12 refills | Status: DC

## 2018-02-05 MED ORDER — APIXABAN 2.5 MG PO TABS: 2.5000 mg | ORAL_TABLET | Freq: Two times a day (BID) | ORAL | 2 refills | Status: DC

## 2018-02-05 MED ORDER — PRAVASTATIN SODIUM 80 MG PO TABS: 80.0000 mg | ORAL_TABLET | Freq: Every day | ORAL | 3 refills | Status: DC

## 2018-02-05 NOTE — Progress Notes (Signed)
Hemoglobin A1c 13.9 -please notify patient of higher A1c level when compared to his recent labs.  He presented last week with critically high glucose which appeared to be secondary to steroid use required for acute exacerbation of COPD.  With elevated A1c patient is likely uncontrolled sugars or noncompliance since last visit, September 26, 2017.  Patient did go to the hospital.  And he was urged to follow-up with PCP.  Will address uncontrolled DM at f/up appt

## 2018-02-05 NOTE — Telephone Encounter (Signed)
Spoke to [place] and meds refilled as per her request

## 2018-02-05 NOTE — Telephone Encounter (Signed)
[facility] calling regarding order for this patient and questioning some meds that are missing  Please call her back at [phone]

## 2018-02-06 ENCOUNTER — Emergency Department (HOSPITAL_COMMUNITY)
Admission: EM | Admit: 2018-02-06 | Discharge: 2018-02-06 | Disposition: A | Discharge status: Home / Self Care | Payer: Medicare Other | Attending: Emergency Medicine | Admitting: Emergency Medicine

## 2018-02-06 ENCOUNTER — Encounter (HOSPITAL_COMMUNITY): Payer: Self-pay | Admitting: Emergency Medicine

## 2018-02-06 ENCOUNTER — Telehealth: Payer: Self-pay | Admitting: Physician Assistant

## 2018-02-06 ENCOUNTER — Emergency Department (HOSPITAL_COMMUNITY): Payer: Medicare Other

## 2018-02-06 ENCOUNTER — Other Ambulatory Visit: Payer: Self-pay

## 2018-02-06 DIAGNOSIS — R739 Hyperglycemia, unspecified: Secondary | ICD-10-CM

## 2018-02-06 DIAGNOSIS — I5032 Chronic diastolic (congestive) heart failure: Secondary | ICD-10-CM | POA: Insufficient documentation

## 2018-02-06 DIAGNOSIS — N183 Chronic kidney disease, stage 3 (moderate): Secondary | ICD-10-CM | POA: Insufficient documentation

## 2018-02-06 DIAGNOSIS — E1165 Type 2 diabetes mellitus with hyperglycemia: Secondary | ICD-10-CM | POA: Insufficient documentation

## 2018-02-06 DIAGNOSIS — Z87891 Personal history of nicotine dependence: Secondary | ICD-10-CM | POA: Diagnosis not present

## 2018-02-06 DIAGNOSIS — I130 Hypertensive heart and chronic kidney disease with heart failure and stage 1 through stage 4 chronic kidney disease, or unspecified chronic kidney disease: Secondary | ICD-10-CM | POA: Insufficient documentation

## 2018-02-06 DIAGNOSIS — Z7982 Long term (current) use of aspirin: Secondary | ICD-10-CM | POA: Insufficient documentation

## 2018-02-06 DIAGNOSIS — Z7901 Long term (current) use of anticoagulants: Secondary | ICD-10-CM | POA: Diagnosis not present

## 2018-02-06 DIAGNOSIS — Z794 Long term (current) use of insulin: Secondary | ICD-10-CM | POA: Diagnosis not present

## 2018-02-06 DIAGNOSIS — E1122 Type 2 diabetes mellitus with diabetic chronic kidney disease: Secondary | ICD-10-CM | POA: Insufficient documentation

## 2018-02-06 DIAGNOSIS — J449 Chronic obstructive pulmonary disease, unspecified: Secondary | ICD-10-CM | POA: Diagnosis not present

## 2018-02-06 DIAGNOSIS — Z79899 Other long term (current) drug therapy: Secondary | ICD-10-CM | POA: Diagnosis not present

## 2018-02-06 LAB — CBC WITH DIFFERENTIAL/PLATELET
BASOS ABS: 0 10*3/uL (ref 0.0–0.1)
BASOS PCT: 0 %
EOS ABS: 0 10*3/uL (ref 0.0–0.7)
Eosinophils Relative: 0 %
HCT: 28.9 % — ABNORMAL LOW (ref 39.0–52.0)
HEMOGLOBIN: 9.2 g/dL — AB (ref 13.0–17.0)
Lymphocytes Relative: 4 %
Lymphs Abs: 0.4 10*3/uL — ABNORMAL LOW (ref 0.7–4.0)
MCH: 23.1 pg — AB (ref 26.0–34.0)
MCHC: 31.8 g/dL (ref 30.0–36.0)
MCV: 72.4 fL — ABNORMAL LOW (ref 78.0–100.0)
MONOS PCT: 6 %
Monocytes Absolute: 0.6 10*3/uL (ref 0.1–1.0)
NEUTROS PCT: 90 %
Neutro Abs: 9 10*3/uL (ref 1.7–7.7)
PLATELETS: 350 10*3/uL (ref 150–400)
RBC: 3.99 MIL/uL — AB (ref 4.22–5.81)
RDW: 18.2 % — ABNORMAL HIGH (ref 11.5–15.5)
WBC: 10 10*3/uL (ref 4.0–10.5)

## 2018-02-06 LAB — URINALYSIS, ROUTINE W REFLEX MICROSCOPIC
BACTERIA UA: NONE SEEN
BILIRUBIN URINE: NEGATIVE
Glucose, UA: >=500 mg/dL — AB
KETONES UR: NEGATIVE mg/dL
LEUKOCYTES UA: NEGATIVE
NITRITE: NEGATIVE
PH: 6 (ref 5.0–8.0)
PROTEIN: NEGATIVE mg/dL
Specific Gravity, Urine: 1.013 (ref 1.005–1.030)

## 2018-02-06 LAB — COMPREHENSIVE METABOLIC PANEL
ALK PHOS: 43 U/L (ref 38–126)
ALT: 81 U/L — ABNORMAL HIGH (ref 17–63)
AST: 51 U/L — ABNORMAL HIGH (ref 15–41)
Albumin: 3.5 g/dL (ref 3.5–5.0)
Anion gap: 10 (ref 5–15)
BUN: 64 mg/dL — ABNORMAL HIGH (ref 6–20)
CALCIUM: 8.8 mg/dL — AB (ref 8.9–10.3)
CO2: 30 mmol/L (ref 22–32)
CREATININE: 1.62 mg/dL — AB (ref 0.61–1.24)
Chloride: 89 mmol/L — ABNORMAL LOW (ref 101–111)
GFR calc non Af Amer: 38 mL/min — ABNORMAL LOW (ref >60)
GFR, EST AFRICAN AMERICAN: 45 mL/min — AB (ref >60)
Glucose, Bld: 466 mg/dL — ABNORMAL HIGH (ref 65–99)
Potassium: 5.3 mmol/L — ABNORMAL HIGH (ref 3.5–5.1)
SODIUM: 129 mmol/L — AB (ref 135–145)
Total Bilirubin: 0.7 mg/dL (ref 0.3–1.2)
Total Protein: 6.2 g/dL — ABNORMAL LOW (ref 6.5–8.1)

## 2018-02-06 LAB — CBG MONITORING, ED
GLUCOSE-CAPILLARY: 398 mg/dL — AB (ref 65–99)
GLUCOSE-CAPILLARY: 392 mg/dL — AB (ref 65–99)
Glucose-Capillary: 439 mg/dL — ABNORMAL HIGH (ref 65–99)

## 2018-02-06 MED ORDER — INSULIN ASPART 100 UNIT/ML ~~LOC~~ SOLN
5.0000 [IU] | Freq: Once | SUBCUTANEOUS | Status: AC
  Administered 2018-02-06: 5 [IU] via SUBCUTANEOUS
  Filled 2018-02-06: qty 1

## 2018-02-06 MED ORDER — SODIUM CHLORIDE 0.9 % IV BOLUS
500.0000 mL | Freq: Once | INTRAVENOUS | Status: AC
  Administered 2018-02-06: 500 mL via INTRAVENOUS

## 2018-02-06 NOTE — Telephone Encounter (Signed)
Patient's daughter called in stating that patient fasting blood sugars are 449 before eating and after eating it was 585  after taking his medications. Advised patient to go to the ER for further workup of elevated blood sugars. Patient's daughter verbalized understanding and stated that they will follow up with PCP at appointment tomorrow if not admitted.

## 2018-02-06 NOTE — ED Triage Notes (Signed)
Pt just dc as inpatient for hyperglycemia last week. States sugar over 500 today per son. A/o. Mild dyspnea noted. abd tight and distended. Pt denies sob. Pt denies pain.

## 2018-02-06 NOTE — ED Provider Notes (Signed)
[facility] EMERGENCY DEPARTMENT Provider Note   CSN: [id number] Arrival date & time: 02/06/18  1133     History   Chief Complaint Chief Complaint  Patient presents with  . Hyperglycemia    HPI Edward [name] is a 83 y.o. male.  Patient was sent to the emergency department because his sugar was elevated.  He was recently started on prednisone for his severe COPD.  He takes insulin 35 units twice a day  The history is provided by the patient. No language interpreter was used.  Illness  This is a recurrent problem. The current episode started more than 2 days ago. The problem occurs constantly. The problem has not changed since onset.Pertinent negatives include no chest pain, no abdominal pain and no headaches. Nothing aggravates the symptoms. Nothing relieves the symptoms. He has tried nothing for the symptoms. The treatment provided no relief.    Past Medical History:  Diagnosis Date  . Allergy    Rhinitis  . Bronchitis   . Chronic respiratory failure ([place])   . Colon polyps   . COPD (chronic obstructive pulmonary disease) ([place])   . Diabetes mellitus   . Elevated lipids   . Hypercholesterolemia   . Hypertension   . Noncompliance   . On home O2    2L N/C   . PSA elevation   . Pulmonary fibrosis ([place])   . Vitamin D deficiency     Patient Active Problem List   Diagnosis Date Noted  . Gastroesophageal reflux disease   . CAP (community acquired pneumonia) 02/01/2018  . Acute encephalopathy 09/25/2017  . Acute confusion 09/25/2017  . On home O2 09/25/2017  . Bilateral lower extremity edema 06/27/2017  . Insomnia 03/28/2017  . Uncontrolled type 2 diabetes mellitus with hyperglycemia, with long-term current use of insulin ([place]) 03/07/2017  . CKD (chronic kidney disease), stage III ([place]) 03/07/2017  . Palliative care encounter   . Goals of care, counseling/discussion   . Encounter for hospice care discussion   . AF (paroxysmal atrial fibrillation) ([place]) 03/05/2017  .  Chronic diastolic CHF (congestive heart failure) ([place]) 03/04/2017  . Chronic respiratory failure ([place]) 03/04/2017  . Atrial fibrillation with rapid ventricular response ([name])   . Chronic diastolic heart failure ([place])   . Constipation 02/25/2017  . Overflow diarrhea/Constipation 02/25/2017  . Diabetes mellitus type 2, uncontrolled, without complications ([place]) 61/44/3154  . Non compliance w medication regimen 12/02/2015  . Hypercholesterolemia 05/12/2014  . Elevated LFTs 05/12/2014  . Foot laceration 05/12/2014  . Dyspnea 12/19/2013  . Acute on chronic respiratory failure with hypoxia ([place]) 11/17/2013  . COPD exacerbation ([place]) 11/15/2013  . Atrial fibrillation ([place]) 11/10/2013  . Elevated PSA 01/20/2013  . Diabetes mellitus type 2, uncontrolled ([place])   . COPD (chronic obstructive pulmonary disease) ([place])   . Hypertension   . Allergy   . Elevated lipids   . Pulmonary fibrosis ([place])   . Bronchitis   . Colon polyps   . Colon polyps     Past Surgical History:  Procedure Laterality Date  . CATARACT EXTRACTION W/PHACO  06/25/2012   Procedure: CATARACT EXTRACTION PHACO AND INTRAOCULAR LENS PLACEMENT (IOC);  Surgeon: Elta Guadeloupe T. Gershon Crane, MD;  Location: [facility];  Service: Ophthalmology;  Laterality: Left;  CDE=19.01  . CATARACT EXTRACTION W/PHACO  07/09/2012   Procedure: CATARACT EXTRACTION PHACO AND INTRAOCULAR LENS PLACEMENT (IOC);  Surgeon: Elta Guadeloupe T. Gershon Crane, MD;  Location: [facility];  Service: Ophthalmology;  Laterality: Right;  CDE: 20.09  Home Medications    Prior to Admission medications   Medication Sig Start Date End Date Taking? Authorizing Provider  albuterol (PROVENTIL) (2.5 MG/3ML) 0.083% nebulizer solution INHALE 1 VIAL VIA NEBULIZER EVERY 6 HOURS AS NEEDED FOR WHEEZING OR SHORTNESS OF BREATH 07/19/17  Yes Orlena Sheldon, PA-C  apixaban (ELIQUIS) 2.5 MG TABS tablet Take 1 tablet (2.5 mg total) by mouth 2 (two) times daily. 02/05/18  Yes Orlena Sheldon, PA-C  aspirin EC 81 MG  tablet Take 81 mg by mouth daily.   Yes [provider]  budesonide (PULMICORT) 0.5 MG/2ML nebulizer solution Take 2 mLs (0.5 mg total) by nebulization 2 (two) times daily. 02/05/18  Yes Dena Billet B, PA-C  cloNIDine (CATAPRES) 0.1 MG tablet TAKE 1 TABLET BY MOUTH 2 TIMES A DAY 01/28/18  Yes Dena Billet B, PA-C  diltiazem (CARDIZEM CD) 240 MG 24 hr capsule TAKE 1 CAPSULE BY MOUTH DAILY 12/03/17  Yes Dena Billet B, PA-C  furosemide (LASIX) 40 MG tablet Take 1 tablet (40 mg total) by mouth every other day. Patient taking differently: Take 80 mg by mouth 3 (three) times daily.  09/26/17 09/26/18 Yes Johnson, Clanford L, MD  guaiFENesin (MUCINEX) 600 MG 12 hr tablet Take 1 tablet (600 mg total) by mouth 2 (two) times daily. 02/04/18  Yes Barton Dubois, MD  insulin detemir (LEVEMIR) 100 unit/ml SOLN Inject 0.35 mLs (35 Units total) into the skin 2 (two) times daily. 02/04/18  Yes Barton Dubois, MD  insulin lispro (HUMALOG) 100 UNIT/ML KwikPen Junior Inject 0.2 mLs (20 Units total) into the skin 3 (three) times daily. 02/05/18  Yes Dena Billet B, PA-C  ipratropium (ATROVENT) 0.02 % nebulizer solution Take 2.5 mLs (0.5 mg total) by nebulization 4 (four) times daily. 02/05/18  Yes Orlena Sheldon, PA-C  levofloxacin (LEVAQUIN) 500 MG tablet Take 1 tablet (500 mg total) by mouth daily at 8 pm for 5 days. 02/04/18 02/09/18 Yes Barton Dubois, MD  losartan (COZAAR) 100 MG tablet TAKE 1 TABLET BY MOUTH DAILY 12/31/17  Yes Dena Billet B, PA-C  metoprolol tartrate (LOPRESSOR) 25 MG tablet TAKE 1 TABLET BY MOUTH TWICE DAILY 12/31/17  Yes Dena Billet B, PA-C  OXYGEN Inhale 3 L into the lungs daily.    Yes [provider]  pravastatin (PRAVACHOL) 80 MG tablet Take 1 tablet (80 mg total) by mouth at bedtime. 02/05/18  Yes Orlena Sheldon, PA-C  predniSONE (DELTASONE) 20 MG tablet Take 2 tablets by mouth daily x2 days; then 1 tablet by mouth daily x3 days; then half a tablet by mouth daily x3 days and stop prednisone. 02/04/18   Yes Barton Dubois, MD  omeprazole (PRILOSEC) 20 MG capsule Take 1 capsule (20 mg total) by mouth daily. Patient not taking: Reported on 02/06/2018 02/04/18   Barton Dubois, MD  potassium chloride SA (K-DUR,KLOR-CON) 20 MEQ tablet Take 1 tablet (20 mEq total) by mouth every other day. Take with lasix. Patient not taking: Reported on 02/06/2018 09/26/17   [name], MD    Family History Family History  Problem Relation Age of Onset  . Heart disease Mother   . CAD Other   . Diabetes Other     Social History Social History   Tobacco Use  . Smoking status: Former Smoker    Packs/day: 1.50    Years: 60.00    Pack years: 90.00    Types: Cigarettes    Last attempt to quit: 12/31/2012    Years since quitting: 5.1  .  Smokeless tobacco: Never Used  Substance Use Topics  . Alcohol use: No  . Drug use: No     Allergies   Ace inhibitors   Review of Systems Review of Systems  Constitutional: Positive for fatigue. Negative for appetite change.  HENT: Negative for congestion, ear discharge and sinus pressure.   Eyes: Negative for discharge.  Respiratory: Negative for cough.   Cardiovascular: Negative for chest pain.  Gastrointestinal: Negative for abdominal pain and diarrhea.  Genitourinary: Negative for frequency and hematuria.  Musculoskeletal: Negative for back pain.  Skin: Negative for rash.  Neurological: Negative for seizures and headaches.  Psychiatric/Behavioral: Negative for hallucinations.     Physical Exam Updated Vital Signs BP (!) 147/74   Pulse 84   Temp 98 F (36.7 C) (Oral)   Resp 20   SpO2 96%   Physical Exam  Constitutional: He is oriented to person, place, and time. He appears well-developed.  HENT:  Head: Normocephalic.  Eyes: Conjunctivae and EOM are normal. No scleral icterus.  Neck: Neck supple. No thyromegaly present.  Cardiovascular: Normal rate and regular rhythm. Exam reveals no gallop and no friction rub.  No murmur  heard. Pulmonary/Chest: No stridor. He has wheezes. He has no rales. He exhibits no tenderness.  Abdominal: He exhibits no distension. There is no tenderness. There is no rebound.  Musculoskeletal: Normal range of motion. He exhibits no edema.  Lymphadenopathy:    He has no cervical adenopathy.  Neurological: He is oriented to person, place, and time. He exhibits normal muscle tone. Coordination normal.  Skin: No rash noted. No erythema.  Psychiatric: He has a normal mood and affect. His behavior is normal.     ED Treatments / Results  Labs (all labs ordered are listed, but only abnormal results are displayed) Labs Reviewed  COMPREHENSIVE METABOLIC PANEL - Abnormal; Notable for the following components:      Result Value   Sodium 129 (*)    Potassium 5.3 (*)    Chloride 89 (*)    Glucose, Bld 466 (*)    BUN 64 (*)    Creatinine, Ser 1.62 (*)    Calcium 8.8 (*)    Total Protein 6.2 (*)    AST 51 (*)    ALT 81 (*)    GFR calc non Af Amer 38 (*)    GFR calc Af Amer 45 (*)    All other components within normal limits  CBC WITH DIFFERENTIAL/PLATELET - Abnormal; Notable for the following components:   RBC 3.99 (*)    Hemoglobin 9.2 (*)    HCT 28.9 (*)    MCV 72.4 (*)    MCH 23.1 (*)    RDW 18.2 (*)    Lymphs Abs 0.4 (*)    All other components within normal limits  URINALYSIS, ROUTINE W REFLEX MICROSCOPIC - Abnormal; Notable for the following components:   Color, Urine STRAW (*)    Glucose, UA >=500 (*)    Hgb urine dipstick SMALL (*)    All other components within normal limits  CBG MONITORING, ED - Abnormal; Notable for the following components:   Glucose-Capillary 439 (*)    All other components within normal limits  CBG MONITORING, ED - Abnormal; Notable for the following components:   Glucose-Capillary 398 (*)    All other components within normal limits  CBG MONITORING, ED - Abnormal; Notable for the following components:   Glucose-Capillary 392 (*)    All other  components within normal limits  EKG None  Radiology No results found.  Procedures Procedures (including critical care time)  Medications Ordered in ED Medications  sodium chloride 0.9 % bolus 500 mL (0 mLs Intravenous Stopped 02/06/18 1244)  insulin aspart (novoLOG) injection 5 Units (5 Units Subcutaneous Given 02/06/18 1202)     Initial Impression / Assessment and Plan / ED Course  I have reviewed the triage vital signs and the nursing notes.  Pertinent labs & imaging results that were available during my care of the patient were reviewed by me and considered in my medical decision making (see chart for details).     Patient with elevated glucose secondary to diabetes and recent prednisone dosing.  Patient was given fluids and insulin and and brought her sugar down some.  We will increase his insulin to 40 units twice a day from 35 units twice a day and he will follow-up with his PCP  Final Clinical Impressions(s) / ED Diagnoses   Final diagnoses:  Hyperglycemia    ED Discharge Orders    None       Milton Ferguson, MD 02/06/18 1458

## 2018-02-06 NOTE — ED Notes (Signed)
Iv to left FA not documented when put in but removed upon d/c with cath intact. Bleeding controlled. nad

## 2018-02-06 NOTE — Discharge Instructions (Signed)
Increase your insulin to 40 units twice a day until you finish your prednisone and follow-up with your primary care provider tomorrow as planned

## 2018-02-07 ENCOUNTER — Encounter: Payer: Self-pay | Admitting: Physician Assistant

## 2018-02-07 ENCOUNTER — Ambulatory Visit (INDEPENDENT_AMBULATORY_CARE_PROVIDER_SITE_OTHER): Payer: Medicare Other | Admitting: Physician Assistant

## 2018-02-07 ENCOUNTER — Other Ambulatory Visit: Payer: Self-pay

## 2018-02-07 VITALS — BP 140/62 | HR 86 | Temp 98.0°F | Resp 16 | Ht 65.0 in | Wt 188.8 lb

## 2018-02-07 DIAGNOSIS — R600 Localized edema: Secondary | ICD-10-CM | POA: Diagnosis not present

## 2018-02-07 DIAGNOSIS — N183 Chronic kidney disease, stage 3 unspecified: Secondary | ICD-10-CM

## 2018-02-07 DIAGNOSIS — J441 Chronic obstructive pulmonary disease with (acute) exacerbation: Secondary | ICD-10-CM | POA: Diagnosis not present

## 2018-02-07 DIAGNOSIS — J9621 Acute and chronic respiratory failure with hypoxia: Secondary | ICD-10-CM | POA: Diagnosis not present

## 2018-02-07 DIAGNOSIS — Z09 Encounter for follow-up examination after completed treatment for conditions other than malignant neoplasm: Secondary | ICD-10-CM | POA: Diagnosis not present

## 2018-02-07 DIAGNOSIS — Z794 Long term (current) use of insulin: Secondary | ICD-10-CM

## 2018-02-07 DIAGNOSIS — I10 Essential (primary) hypertension: Secondary | ICD-10-CM | POA: Diagnosis not present

## 2018-02-07 DIAGNOSIS — E1165 Type 2 diabetes mellitus with hyperglycemia: Secondary | ICD-10-CM

## 2018-02-07 DIAGNOSIS — Z9114 Patient's other noncompliance with medication regimen: Secondary | ICD-10-CM | POA: Diagnosis not present

## 2018-02-07 DIAGNOSIS — J8410 Pulmonary fibrosis, unspecified: Secondary | ICD-10-CM | POA: Diagnosis not present

## 2018-02-07 LAB — CULTURE, BLOOD (ROUTINE X 2)
CULTURE: NO GROWTH
Culture: NO GROWTH
SPECIAL REQUESTS: ADEQUATE
Special Requests: ADEQUATE

## 2018-02-07 NOTE — Progress Notes (Signed)
Patient ID: Edward [name] MRN: [id number], DOB: 1934-11-30, 83 y.o. Date of Encounter: @DATE @  Chief Complaint:  Chief Complaint  Patient presents with  . discuss blood sugar  . discuss diabetes  . Foot Swelling    HPI: 83 y.o. year old male  presents for f/u after recent Hospitalization.   I have reviewed his recent notes and his discharge summary from hospitalization 5/12/08/2016 through 02/04/2018. Have also reviewed his ED note from yesterday.  --------------------------------------------------------------------------------------------------------------------------------------------------------------------------------------------------- The following information is copied from his recent hospitalization 02/01/2018-02/04/2018:   Admit date: 02/01/2018 Discharge date: 02/04/2018  Time spent: 35 minutes  Recommendations for Outpatient Follow-up:  1. Repeat basic metabolic panel to follow electrolytes and renal function 2. Reassess patient blood pressure and adjust antihypertensive regimen as needed 3. Please follow closely patient's CBGs and A1c and further adjust hypoglycemic regimen as required. 4. Repeat chest x-ray in 4 to 6 weeks to assure complete resolution of the lungs infiltrates.   History of present illness:  As per H&P written by Dr. Darrick Meigs 02/01/18 83 y.o.male,withehistory of COPD, chronic respiratory failure on 3 L oxygen by nasal cannula, pulmonary fibrosis, chronic diastolic CHF, diabetes mellitus, chronic kidney disease stage III, hypertension, atrial fibrillation who was sent to ED from PCP office after patient was found to be short of breath along with hyperglycemia. Patient found with acute on chronic renal failure in the setting of COPD exacerbation and community-acquired pneumonia. Patient also found with hyperosmolar nonketotic hyperglycemia.  Hospital Course:  1-acute on chronic respiratory failurewith hypoxia:in the setting of COPD exacerbation, underlying  pulmonary fibrosis and community-acquired pneumonia. -Given improvement in his breathing, will discharge with steroids tapering, continue nebulizer treatment, and PO antibiotics. -patient's oxygen back to baseline supplementation.  -Continue the use of flutter valve and continue also Mucinex. -outpatient follow up with PCP in 10 days.   2-hyperosmolar nonketotic hyperglycemic state: in a patient with uncontrolled type diabetes with nephropathy -Patient was treated with insulin drip and subsequently transitioned to long-acting and sliding scale insulin -Due to the use of steroids his CBGswere slightly more difficult to control and were fluctuating. -hemoglobin A1c13.9, demonstrating poor control -patient has been discharge on levemir BID and TID novolog -instructed to follow low carb diet and to check CBG's three times a day. -close follow up with PCP to further adjust hypoglycemic regimen as needed.   3-pseudohyponatremia -In the setting of severe hyperglycemia -improved/resolved -Will recommend repeat basic metabolic panel at follow-up visit to reassess electrolytes trend. -Patient advised to maintain adequate hydration.  4-atrial fibrillation -Rate fluctuating slightly on admission, most likely secondary to the use of albuterol  -stable and well controlled now -Continue Cardizem and metoprolol -Continue Eliquis. -will move to med-surg bed  5-hypertension -Blood pressure stable overall -Continue current antihypertensive regimen -Follow vital signs and further adjust treatment as needed  6-hyperlipidemia -continue statins   7-GERD -continue PPI  8-patient with increase post-voiding residual -required in/out cath X1 -increase physical activity and resume lasix -most likely some underlying BPH -no further symptoms of urinary retention appreciated on exam and no complaints from patient. -further outpatient follow up and reassessment recommended.  9-acute on  chronic renal failure -stage 3 at baseline -acute failure In the setting of dehydration (most likely from hyperglycemia) -advice to keep himself well hydrated at discharge -Cr essentially back to baseline at discharge -repeat BMET to follow electrolytes and renal function    --------------------------------------------------------------------------------------------------------------------------------------------------------------------------------------------------------------------------------  Today his 2 concerns are: --Feet are swelling --Sugars are high------ he brings in  a blood sugar log sheet.  He has readings written down on a sheet that has the dates from back in September of last year.   Along the left column the dates are 9/24 through 9/28.   It is next to these dates that he has blood sugar readings written down as 334, 442, 449, 585, 320.  I ask about how he is dosing his insulin. He says that 1 of the types he does 40 units in the morning He says that for the other type he does 24 units at bedtime  This is how he is doing insulin.  He is only administering insulin at these times a day.  Also noted on his medication list that the nursing staff today had marked under the Lasix "patient not taking "    Past Medical History:  Diagnosis Date  . Allergy    Rhinitis  . Bronchitis   . Chronic respiratory failure ([place])   . Colon polyps   . COPD (chronic obstructive pulmonary disease) ([place])   . Diabetes mellitus   . Elevated lipids   . Hypercholesterolemia   . Hypertension   . Noncompliance   . On home O2    2L N/C   . PSA elevation   . Pulmonary fibrosis ([place])   . Vitamin D deficiency      Home Meds: Outpatient Medications Prior to Visit  Medication Sig Dispense Refill  . albuterol (PROVENTIL) (2.5 MG/3ML) 0.083% nebulizer solution INHALE 1 VIAL VIA NEBULIZER EVERY 6 HOURS AS NEEDED FOR WHEEZING OR SHORTNESS OF BREATH 360 mL 5  . apixaban (ELIQUIS) 2.5 MG TABS  tablet Take 1 tablet (2.5 mg total) by mouth 2 (two) times daily. 60 tablet 2  . aspirin EC 81 MG tablet Take 81 mg by mouth daily.    . budesonide (PULMICORT) 0.5 MG/2ML nebulizer solution Take 2 mLs (0.5 mg total) by nebulization 2 (two) times daily. 120 mL 5  . cloNIDine (CATAPRES) 0.1 MG tablet TAKE 1 TABLET BY MOUTH 2 TIMES A DAY 60 tablet 2  . diltiazem (CARDIZEM CD) 240 MG 24 hr capsule TAKE 1 CAPSULE BY MOUTH DAILY 30 capsule 2  . guaiFENesin (MUCINEX) 600 MG 12 hr tablet Take 1 tablet (600 mg total) by mouth 2 (two) times daily. 40 tablet 0  . insulin detemir (LEVEMIR) 100 unit/ml SOLN Inject 0.35 mLs (35 Units total) into the skin 2 (two) times daily. 10 mL 3  . insulin lispro (HUMALOG) 100 UNIT/ML KwikPen Junior Inject 0.2 mLs (20 Units total) into the skin 3 (three) times daily. 3 mL 3  . ipratropium (ATROVENT) 0.02 % nebulizer solution Take 2.5 mLs (0.5 mg total) by nebulization 4 (four) times daily. 25 mL 12  . levofloxacin (LEVAQUIN) 500 MG tablet Take 1 tablet (500 mg total) by mouth daily at 8 pm for 5 days. 5 tablet 0  . losartan (COZAAR) 100 MG tablet TAKE 1 TABLET BY MOUTH DAILY 30 tablet 0  . metoprolol tartrate (LOPRESSOR) 25 MG tablet TAKE 1 TABLET BY MOUTH TWICE DAILY 60 tablet 0  . OXYGEN Inhale 3 L into the lungs daily.     . potassium chloride SA (K-DUR,KLOR-CON) 20 MEQ tablet Take 1 tablet (20 mEq total) by mouth every other day. Take with lasix.    Marland Kitchen predniSONE (DELTASONE) 20 MG tablet Take 2 tablets by mouth daily x2 days; then 1 tablet by mouth daily x3 days; then half a tablet by mouth daily x3 days and stop prednisone. 9  tablet 0  . furosemide (LASIX) 40 MG tablet Take 1 tablet (40 mg total) by mouth every other day. (Patient not taking: Reported on 02/07/2018)    . omeprazole (PRILOSEC) 20 MG capsule Take 1 capsule (20 mg total) by mouth daily. (Patient not taking: Reported on 02/06/2018) 30 capsule 3  . pravastatin (PRAVACHOL) 80 MG tablet Take 1 tablet (80 mg total)  by mouth at bedtime. (Patient not taking: Reported on 02/07/2018) 30 tablet 3   No facility-administered medications prior to visit.     Allergies:  Allergies  Allergen Reactions  . Ace Inhibitors Other (See Comments)    Hyperkalemia--07/23/2013:patient states not familiar with the following allergy    Social History   Socioeconomic History  . Marital status: Married    Spouse name: Not on file  . Number of children: Not on file  . Years of education: Not on file  . Highest education level: Not on file  Occupational History  . 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 Financial resource strain: Not on file  . Food insecurity:    Worry: Not on file    Inability: Not on file  . Transportation needs:    Medical: Not on file    Non-medical: Not on file  Tobacco Use  . Smoking status: Former Smoker    Packs/day: 1.50    Years: 60.00    Pack years: 90.00    Types: Cigarettes    Last attempt to quit: 12/31/2012    Years since quitting: 5.1  . Smokeless tobacco: Never Used  Substance and Sexual Activity  . Alcohol use: No  . Drug use: No  . Sexual activity: Yes    Birth control/protection: None  Lifestyle  . Physical activity:    Days per week: Not on file    Minutes per session: Not on file  . Stress: Not on file  Relationships  . Social connections:    Talks on phone: Not on file    Gets together: Not on file    Attends religious service: Not on file    Active member of club or organization: Not on file    Attends meetings of clubs or organizations: Not on file    Relationship status: Not on file  . Intimate partner violence:    Fear of current or ex partner: Not on file    Emotionally abused: Not on file    Physically abused: Not on file    Forced sexual activity: Not on file  Other Topics Concern  . Not on file  Social History Narrative  . Not on file    Family History  Problem Relation Age of Onset  . Heart disease Mother   .  CAD Other   . Diabetes Other      Review of Systems:  See HPI for pertinent ROS. All other ROS negative.    Physical Exam: Blood pressure 140/62, pulse 86, temperature 98 F (36.7 C), temperature source Oral, resp. rate 16, height 5\' 5"  (1.651 m), weight 85.6 kg (188 lb 12.8 oz), SpO2 94 %., Body mass index is 31.42 kg/m. General: WM. Appears in usual state of health today. Appears in no acute distress. Neck: Supple. No thyromegaly. No lymphadenopathy. Lungs: Decreased, distant breath sounds throughout but no active wheezing rhonchi or rales today. Heart: Regular rhythm. Abdomen: Soft, non-tender, non-distended with normoactive bowel sounds. No hepatomegaly. No rebound/guarding. No obvious abdominal masses. Musculoskeletal:  Strength and tone normal for  age. Extremities/Skin: 2+ pitting edema up to 1 inch above ankle level bilaterally Neuro: Alert and oriented X 3. Moves all extremities spontaneously. Gait is normal. CNII-XII grossly in tact. Psych:  Responds to questions appropriately with a normal affect.     ASSESSMENT AND PLAN:  83 y.o. year old male with   1. Hospital discharge follow-up Discharge summary recommends follow-up bmet and CBC.  These were just repeated yesterday at the ED.  CBC was stable.  Anemia was stable and white count was normal at 10.0.  There continued to be electrolyte abnormality secondary to uncontrolled hyperglycemia.  Also CKD.  Will wait to repeat this lab at follow-up visit.  2. Essential hypertension Blood pressure is stable.  Continue current meds for this.  3. Pulmonary fibrosis ([place])  4. COPD exacerbation ([place]) He is on steroid taper and antibiotics and nebulizers and oxygen.  Physical exam today is stable.  5. Acute on chronic respiratory failure with hypoxia ([name]) He is on steroid taper and antibiotics and nebulizers and oxygen.  Physical exam today is stable.   6. Uncontrolled type 2 diabetes mellitus with hyperglycemia, with long-term  current use of insulin ([name]) Today I have marked on his AVS the 2 types of insulin and the proper doses.  As well I have reviewed this with him out loud.  I have told him to go home and get his wife or son or someone to help make sure he is administering his insulin correctly.   Will have him return for follow-up visit in 1 week.  He is to bring blood sugar log with him to that visit.  7. CKD (chronic kidney disease), stage III ([place]) Will monitor.   8. Non compliance w medication regimen He has repeated noncompliance with medications repeatedly in the past.  Continue to feel quite certain that illiteracy as part of the problem.  See comments above regarding his blood sugar log form today and the way he is dosing his insulin that does not match what was on his instruction sheet from the hospital.  9. Bilateral lower extremity edema Today I reviewed with him that our staff had noted "patient not taking "under his Lasix today.  Have explained to him that this is the medicine that he needs to take make sure to take to get rid of his swelling.  Have marked it on his AVS and told him to go home and make sure he is taking this medication.  Will have him return for follow-up in 1 week.  Planned for follow-up office visit in 1 week.  He is to follow-up sooner if needed.  Signed, [address] [place], [place], [facility] 02/07/2018 10:50 AM

## 2018-02-07 NOTE — Patient Outreach (Signed)
[facility]) Care Management  02/07/2018  Edward [name] [birth year] [id number]   EMMI- General Discharge RED ON EMMI ALERT Day # 1 Date: 02/06/18 Red Alert Reason:  Read discharge papers? No Know who to call about changes in condition? no  Outreach attempt # 1 Telephone call to patient.  Patient reluctantly verified HIPAA.  Addressed red alerts with patient.  He states that he has not read his discharge instructions but states he saw his doctor today and she discussed his care. Patient states that if he has problems he knows what to do.  Discussed [facility] services.  Patient declined services and hung up the phone.      Plan: RN CM will close case as patient declined services.   [name], RN, MSN [facility] Care Management Care Management Coordinator Direct Line [phone] Toll Free: [phone]  Fax: [phone]

## 2018-02-08 DIAGNOSIS — E785 Hyperlipidemia, unspecified: Secondary | ICD-10-CM | POA: Diagnosis not present

## 2018-02-08 DIAGNOSIS — J9611 Chronic respiratory failure with hypoxia: Secondary | ICD-10-CM | POA: Diagnosis not present

## 2018-02-08 DIAGNOSIS — Z7951 Long term (current) use of inhaled steroids: Secondary | ICD-10-CM | POA: Diagnosis not present

## 2018-02-08 DIAGNOSIS — Z794 Long term (current) use of insulin: Secondary | ICD-10-CM | POA: Diagnosis not present

## 2018-02-08 DIAGNOSIS — J8410 Pulmonary fibrosis, unspecified: Secondary | ICD-10-CM | POA: Diagnosis not present

## 2018-02-08 DIAGNOSIS — E1165 Type 2 diabetes mellitus with hyperglycemia: Secondary | ICD-10-CM | POA: Diagnosis not present

## 2018-02-08 DIAGNOSIS — K219 Gastro-esophageal reflux disease without esophagitis: Secondary | ICD-10-CM | POA: Diagnosis not present

## 2018-02-08 DIAGNOSIS — Z9981 Dependence on supplemental oxygen: Secondary | ICD-10-CM | POA: Diagnosis not present

## 2018-02-08 DIAGNOSIS — Z7901 Long term (current) use of anticoagulants: Secondary | ICD-10-CM | POA: Diagnosis not present

## 2018-02-08 DIAGNOSIS — N183 Chronic kidney disease, stage 3 (moderate): Secondary | ICD-10-CM | POA: Diagnosis not present

## 2018-02-08 DIAGNOSIS — J440 Chronic obstructive pulmonary disease with acute lower respiratory infection: Secondary | ICD-10-CM | POA: Diagnosis not present

## 2018-02-08 DIAGNOSIS — E559 Vitamin D deficiency, unspecified: Secondary | ICD-10-CM | POA: Diagnosis not present

## 2018-02-08 DIAGNOSIS — I4891 Unspecified atrial fibrillation: Secondary | ICD-10-CM | POA: Diagnosis not present

## 2018-02-08 DIAGNOSIS — I5032 Chronic diastolic (congestive) heart failure: Secondary | ICD-10-CM | POA: Diagnosis not present

## 2018-02-08 DIAGNOSIS — Z7982 Long term (current) use of aspirin: Secondary | ICD-10-CM | POA: Diagnosis not present

## 2018-02-08 DIAGNOSIS — J441 Chronic obstructive pulmonary disease with (acute) exacerbation: Secondary | ICD-10-CM | POA: Diagnosis not present

## 2018-02-08 DIAGNOSIS — I130 Hypertensive heart and chronic kidney disease with heart failure and stage 1 through stage 4 chronic kidney disease, or unspecified chronic kidney disease: Secondary | ICD-10-CM | POA: Diagnosis not present

## 2018-02-08 DIAGNOSIS — J189 Pneumonia, unspecified organism: Secondary | ICD-10-CM | POA: Diagnosis not present

## 2018-02-08 DIAGNOSIS — E1122 Type 2 diabetes mellitus with diabetic chronic kidney disease: Secondary | ICD-10-CM | POA: Diagnosis not present

## 2018-02-11 DIAGNOSIS — E1165 Type 2 diabetes mellitus with hyperglycemia: Secondary | ICD-10-CM | POA: Diagnosis not present

## 2018-02-11 DIAGNOSIS — I4891 Unspecified atrial fibrillation: Secondary | ICD-10-CM | POA: Diagnosis not present

## 2018-02-11 DIAGNOSIS — J8410 Pulmonary fibrosis, unspecified: Secondary | ICD-10-CM | POA: Diagnosis not present

## 2018-02-11 DIAGNOSIS — E559 Vitamin D deficiency, unspecified: Secondary | ICD-10-CM | POA: Diagnosis not present

## 2018-02-11 DIAGNOSIS — Z794 Long term (current) use of insulin: Secondary | ICD-10-CM | POA: Diagnosis not present

## 2018-02-11 DIAGNOSIS — J440 Chronic obstructive pulmonary disease with acute lower respiratory infection: Secondary | ICD-10-CM | POA: Diagnosis not present

## 2018-02-11 DIAGNOSIS — J441 Chronic obstructive pulmonary disease with (acute) exacerbation: Secondary | ICD-10-CM | POA: Diagnosis not present

## 2018-02-11 DIAGNOSIS — Z7901 Long term (current) use of anticoagulants: Secondary | ICD-10-CM | POA: Diagnosis not present

## 2018-02-11 DIAGNOSIS — N183 Chronic kidney disease, stage 3 (moderate): Secondary | ICD-10-CM | POA: Diagnosis not present

## 2018-02-11 DIAGNOSIS — I130 Hypertensive heart and chronic kidney disease with heart failure and stage 1 through stage 4 chronic kidney disease, or unspecified chronic kidney disease: Secondary | ICD-10-CM | POA: Diagnosis not present

## 2018-02-11 DIAGNOSIS — E1122 Type 2 diabetes mellitus with diabetic chronic kidney disease: Secondary | ICD-10-CM | POA: Diagnosis not present

## 2018-02-11 DIAGNOSIS — K219 Gastro-esophageal reflux disease without esophagitis: Secondary | ICD-10-CM | POA: Diagnosis not present

## 2018-02-11 DIAGNOSIS — E785 Hyperlipidemia, unspecified: Secondary | ICD-10-CM | POA: Diagnosis not present

## 2018-02-11 DIAGNOSIS — Z7982 Long term (current) use of aspirin: Secondary | ICD-10-CM | POA: Diagnosis not present

## 2018-02-11 DIAGNOSIS — J189 Pneumonia, unspecified organism: Secondary | ICD-10-CM | POA: Diagnosis not present

## 2018-02-11 DIAGNOSIS — I5032 Chronic diastolic (congestive) heart failure: Secondary | ICD-10-CM | POA: Diagnosis not present

## 2018-02-11 DIAGNOSIS — Z7951 Long term (current) use of inhaled steroids: Secondary | ICD-10-CM | POA: Diagnosis not present

## 2018-02-11 DIAGNOSIS — Z9981 Dependence on supplemental oxygen: Secondary | ICD-10-CM | POA: Diagnosis not present

## 2018-02-11 DIAGNOSIS — J9611 Chronic respiratory failure with hypoxia: Secondary | ICD-10-CM | POA: Diagnosis not present

## 2018-02-12 ENCOUNTER — Telehealth: Payer: Self-pay

## 2018-02-12 DIAGNOSIS — Z7901 Long term (current) use of anticoagulants: Secondary | ICD-10-CM | POA: Diagnosis not present

## 2018-02-12 DIAGNOSIS — Z7982 Long term (current) use of aspirin: Secondary | ICD-10-CM | POA: Diagnosis not present

## 2018-02-12 DIAGNOSIS — I5032 Chronic diastolic (congestive) heart failure: Secondary | ICD-10-CM | POA: Diagnosis not present

## 2018-02-12 DIAGNOSIS — J189 Pneumonia, unspecified organism: Secondary | ICD-10-CM | POA: Diagnosis not present

## 2018-02-12 DIAGNOSIS — Z9981 Dependence on supplemental oxygen: Secondary | ICD-10-CM | POA: Diagnosis not present

## 2018-02-12 DIAGNOSIS — J8410 Pulmonary fibrosis, unspecified: Secondary | ICD-10-CM | POA: Diagnosis not present

## 2018-02-12 DIAGNOSIS — E559 Vitamin D deficiency, unspecified: Secondary | ICD-10-CM | POA: Diagnosis not present

## 2018-02-12 DIAGNOSIS — E1165 Type 2 diabetes mellitus with hyperglycemia: Secondary | ICD-10-CM | POA: Diagnosis not present

## 2018-02-12 DIAGNOSIS — J9611 Chronic respiratory failure with hypoxia: Secondary | ICD-10-CM | POA: Diagnosis not present

## 2018-02-12 DIAGNOSIS — E785 Hyperlipidemia, unspecified: Secondary | ICD-10-CM | POA: Diagnosis not present

## 2018-02-12 DIAGNOSIS — Z7951 Long term (current) use of inhaled steroids: Secondary | ICD-10-CM | POA: Diagnosis not present

## 2018-02-12 DIAGNOSIS — J441 Chronic obstructive pulmonary disease with (acute) exacerbation: Secondary | ICD-10-CM | POA: Diagnosis not present

## 2018-02-12 DIAGNOSIS — K219 Gastro-esophageal reflux disease without esophagitis: Secondary | ICD-10-CM | POA: Diagnosis not present

## 2018-02-12 DIAGNOSIS — Z794 Long term (current) use of insulin: Secondary | ICD-10-CM | POA: Diagnosis not present

## 2018-02-12 DIAGNOSIS — J440 Chronic obstructive pulmonary disease with acute lower respiratory infection: Secondary | ICD-10-CM | POA: Diagnosis not present

## 2018-02-12 DIAGNOSIS — E1122 Type 2 diabetes mellitus with diabetic chronic kidney disease: Secondary | ICD-10-CM | POA: Diagnosis not present

## 2018-02-12 DIAGNOSIS — N183 Chronic kidney disease, stage 3 (moderate): Secondary | ICD-10-CM | POA: Diagnosis not present

## 2018-02-12 DIAGNOSIS — I4891 Unspecified atrial fibrillation: Secondary | ICD-10-CM | POA: Diagnosis not present

## 2018-02-12 DIAGNOSIS — I130 Hypertensive heart and chronic kidney disease with heart failure and stage 1 through stage 4 chronic kidney disease, or unspecified chronic kidney disease: Secondary | ICD-10-CM | POA: Diagnosis not present

## 2018-02-12 NOTE — Telephone Encounter (Signed)
[facility] with [facility] is requesting orders to see patient 2 times a week for 3 weeks for  Plan of care

## 2018-02-12 NOTE — Telephone Encounter (Signed)
Approved.  

## 2018-02-14 ENCOUNTER — Ambulatory Visit: Payer: Medicare Other | Admitting: Physician Assistant

## 2018-02-14 DIAGNOSIS — E559 Vitamin D deficiency, unspecified: Secondary | ICD-10-CM | POA: Diagnosis not present

## 2018-02-14 DIAGNOSIS — J440 Chronic obstructive pulmonary disease with acute lower respiratory infection: Secondary | ICD-10-CM | POA: Diagnosis not present

## 2018-02-14 DIAGNOSIS — I130 Hypertensive heart and chronic kidney disease with heart failure and stage 1 through stage 4 chronic kidney disease, or unspecified chronic kidney disease: Secondary | ICD-10-CM | POA: Diagnosis not present

## 2018-02-14 DIAGNOSIS — I5032 Chronic diastolic (congestive) heart failure: Secondary | ICD-10-CM | POA: Diagnosis not present

## 2018-02-14 DIAGNOSIS — K219 Gastro-esophageal reflux disease without esophagitis: Secondary | ICD-10-CM | POA: Diagnosis not present

## 2018-02-14 DIAGNOSIS — E1122 Type 2 diabetes mellitus with diabetic chronic kidney disease: Secondary | ICD-10-CM | POA: Diagnosis not present

## 2018-02-14 DIAGNOSIS — J9611 Chronic respiratory failure with hypoxia: Secondary | ICD-10-CM | POA: Diagnosis not present

## 2018-02-14 DIAGNOSIS — Z794 Long term (current) use of insulin: Secondary | ICD-10-CM | POA: Diagnosis not present

## 2018-02-14 DIAGNOSIS — J189 Pneumonia, unspecified organism: Secondary | ICD-10-CM | POA: Diagnosis not present

## 2018-02-14 DIAGNOSIS — Z9981 Dependence on supplemental oxygen: Secondary | ICD-10-CM | POA: Diagnosis not present

## 2018-02-14 DIAGNOSIS — N183 Chronic kidney disease, stage 3 (moderate): Secondary | ICD-10-CM | POA: Diagnosis not present

## 2018-02-14 DIAGNOSIS — Z7982 Long term (current) use of aspirin: Secondary | ICD-10-CM | POA: Diagnosis not present

## 2018-02-14 DIAGNOSIS — I4891 Unspecified atrial fibrillation: Secondary | ICD-10-CM | POA: Diagnosis not present

## 2018-02-14 DIAGNOSIS — E785 Hyperlipidemia, unspecified: Secondary | ICD-10-CM | POA: Diagnosis not present

## 2018-02-14 DIAGNOSIS — J441 Chronic obstructive pulmonary disease with (acute) exacerbation: Secondary | ICD-10-CM | POA: Diagnosis not present

## 2018-02-14 DIAGNOSIS — Z7901 Long term (current) use of anticoagulants: Secondary | ICD-10-CM | POA: Diagnosis not present

## 2018-02-14 DIAGNOSIS — Z7951 Long term (current) use of inhaled steroids: Secondary | ICD-10-CM | POA: Diagnosis not present

## 2018-02-14 DIAGNOSIS — E1165 Type 2 diabetes mellitus with hyperglycemia: Secondary | ICD-10-CM | POA: Diagnosis not present

## 2018-02-14 DIAGNOSIS — J8410 Pulmonary fibrosis, unspecified: Secondary | ICD-10-CM | POA: Diagnosis not present

## 2018-02-14 NOTE — Telephone Encounter (Signed)
VO orders given.

## 2018-02-15 DIAGNOSIS — J8410 Pulmonary fibrosis, unspecified: Secondary | ICD-10-CM | POA: Diagnosis not present

## 2018-02-15 DIAGNOSIS — K219 Gastro-esophageal reflux disease without esophagitis: Secondary | ICD-10-CM | POA: Diagnosis not present

## 2018-02-15 DIAGNOSIS — I130 Hypertensive heart and chronic kidney disease with heart failure and stage 1 through stage 4 chronic kidney disease, or unspecified chronic kidney disease: Secondary | ICD-10-CM | POA: Diagnosis not present

## 2018-02-15 DIAGNOSIS — Z7901 Long term (current) use of anticoagulants: Secondary | ICD-10-CM | POA: Diagnosis not present

## 2018-02-15 DIAGNOSIS — E1122 Type 2 diabetes mellitus with diabetic chronic kidney disease: Secondary | ICD-10-CM | POA: Diagnosis not present

## 2018-02-15 DIAGNOSIS — Z7982 Long term (current) use of aspirin: Secondary | ICD-10-CM | POA: Diagnosis not present

## 2018-02-15 DIAGNOSIS — E785 Hyperlipidemia, unspecified: Secondary | ICD-10-CM | POA: Diagnosis not present

## 2018-02-15 DIAGNOSIS — I5032 Chronic diastolic (congestive) heart failure: Secondary | ICD-10-CM | POA: Diagnosis not present

## 2018-02-15 DIAGNOSIS — Z794 Long term (current) use of insulin: Secondary | ICD-10-CM | POA: Diagnosis not present

## 2018-02-15 DIAGNOSIS — E1165 Type 2 diabetes mellitus with hyperglycemia: Secondary | ICD-10-CM | POA: Diagnosis not present

## 2018-02-15 DIAGNOSIS — J441 Chronic obstructive pulmonary disease with (acute) exacerbation: Secondary | ICD-10-CM | POA: Diagnosis not present

## 2018-02-15 DIAGNOSIS — J189 Pneumonia, unspecified organism: Secondary | ICD-10-CM | POA: Diagnosis not present

## 2018-02-15 DIAGNOSIS — E559 Vitamin D deficiency, unspecified: Secondary | ICD-10-CM | POA: Diagnosis not present

## 2018-02-15 DIAGNOSIS — I4891 Unspecified atrial fibrillation: Secondary | ICD-10-CM | POA: Diagnosis not present

## 2018-02-15 DIAGNOSIS — J440 Chronic obstructive pulmonary disease with acute lower respiratory infection: Secondary | ICD-10-CM | POA: Diagnosis not present

## 2018-02-15 DIAGNOSIS — Z7951 Long term (current) use of inhaled steroids: Secondary | ICD-10-CM | POA: Diagnosis not present

## 2018-02-15 DIAGNOSIS — Z9981 Dependence on supplemental oxygen: Secondary | ICD-10-CM | POA: Diagnosis not present

## 2018-02-15 DIAGNOSIS — J9611 Chronic respiratory failure with hypoxia: Secondary | ICD-10-CM | POA: Diagnosis not present

## 2018-02-15 DIAGNOSIS — N183 Chronic kidney disease, stage 3 (moderate): Secondary | ICD-10-CM | POA: Diagnosis not present

## 2018-02-16 DIAGNOSIS — E162 Hypoglycemia, unspecified: Secondary | ICD-10-CM | POA: Diagnosis not present

## 2018-02-17 ENCOUNTER — Encounter (HOSPITAL_COMMUNITY): Payer: Self-pay | Admitting: *Deleted

## 2018-02-17 ENCOUNTER — Emergency Department (HOSPITAL_COMMUNITY): Payer: Medicare Other

## 2018-02-17 ENCOUNTER — Other Ambulatory Visit: Payer: Self-pay

## 2018-02-17 ENCOUNTER — Inpatient Hospital Stay (HOSPITAL_COMMUNITY)
Admission: EM | Admit: 2018-02-17 | Discharge: 2018-02-20 | DRG: 190 | Disposition: A | Discharge status: Home Health | Payer: Medicare Other | Attending: Internal Medicine | Admitting: Internal Medicine

## 2018-02-17 DIAGNOSIS — Z9842 Cataract extraction status, left eye: Secondary | ICD-10-CM | POA: Diagnosis not present

## 2018-02-17 DIAGNOSIS — R001 Bradycardia, unspecified: Secondary | ICD-10-CM

## 2018-02-17 DIAGNOSIS — D509 Iron deficiency anemia, unspecified: Secondary | ICD-10-CM | POA: Diagnosis present

## 2018-02-17 DIAGNOSIS — Z7952 Long term (current) use of systemic steroids: Secondary | ICD-10-CM | POA: Diagnosis not present

## 2018-02-17 DIAGNOSIS — J9612 Chronic respiratory failure with hypercapnia: Secondary | ICD-10-CM

## 2018-02-17 DIAGNOSIS — Z961 Presence of intraocular lens: Secondary | ICD-10-CM | POA: Diagnosis present

## 2018-02-17 DIAGNOSIS — J9610 Chronic respiratory failure, unspecified whether with hypoxia or hypercapnia: Secondary | ICD-10-CM | POA: Diagnosis present

## 2018-02-17 DIAGNOSIS — E1165 Type 2 diabetes mellitus with hyperglycemia: Secondary | ICD-10-CM | POA: Diagnosis present

## 2018-02-17 DIAGNOSIS — J189 Pneumonia, unspecified organism: Secondary | ICD-10-CM | POA: Diagnosis not present

## 2018-02-17 DIAGNOSIS — Z7982 Long term (current) use of aspirin: Secondary | ICD-10-CM | POA: Diagnosis not present

## 2018-02-17 DIAGNOSIS — G9341 Metabolic encephalopathy: Secondary | ICD-10-CM | POA: Diagnosis present

## 2018-02-17 DIAGNOSIS — J9611 Chronic respiratory failure with hypoxia: Secondary | ICD-10-CM | POA: Diagnosis present

## 2018-02-17 DIAGNOSIS — Z8249 Family history of ischemic heart disease and other diseases of the circulatory system: Secondary | ICD-10-CM

## 2018-02-17 DIAGNOSIS — Z888 Allergy status to other drugs, medicaments and biological substances status: Secondary | ICD-10-CM

## 2018-02-17 DIAGNOSIS — Z515 Encounter for palliative care: Secondary | ICD-10-CM | POA: Diagnosis present

## 2018-02-17 DIAGNOSIS — I130 Hypertensive heart and chronic kidney disease with heart failure and stage 1 through stage 4 chronic kidney disease, or unspecified chronic kidney disease: Secondary | ICD-10-CM | POA: Diagnosis present

## 2018-02-17 DIAGNOSIS — J441 Chronic obstructive pulmonary disease with (acute) exacerbation: Principal | ICD-10-CM

## 2018-02-17 DIAGNOSIS — I482 Chronic atrial fibrillation: Secondary | ICD-10-CM | POA: Diagnosis present

## 2018-02-17 DIAGNOSIS — E11649 Type 2 diabetes mellitus with hypoglycemia without coma: Secondary | ICD-10-CM | POA: Diagnosis present

## 2018-02-17 DIAGNOSIS — A419 Sepsis, unspecified organism: Secondary | ICD-10-CM | POA: Diagnosis not present

## 2018-02-17 DIAGNOSIS — R680 Hypothermia, not associated with low environmental temperature: Secondary | ICD-10-CM | POA: Diagnosis present

## 2018-02-17 DIAGNOSIS — Z9981 Dependence on supplemental oxygen: Secondary | ICD-10-CM

## 2018-02-17 DIAGNOSIS — N183 Chronic kidney disease, stage 3 unspecified: Secondary | ICD-10-CM | POA: Diagnosis present

## 2018-02-17 DIAGNOSIS — D649 Anemia, unspecified: Secondary | ICD-10-CM | POA: Diagnosis present

## 2018-02-17 DIAGNOSIS — T68XXXA Hypothermia, initial encounter: Secondary | ICD-10-CM

## 2018-02-17 DIAGNOSIS — Z794 Long term (current) use of insulin: Secondary | ICD-10-CM

## 2018-02-17 DIAGNOSIS — E1122 Type 2 diabetes mellitus with diabetic chronic kidney disease: Secondary | ICD-10-CM | POA: Diagnosis present

## 2018-02-17 DIAGNOSIS — Z7901 Long term (current) use of anticoagulants: Secondary | ICD-10-CM | POA: Diagnosis not present

## 2018-02-17 DIAGNOSIS — J8410 Pulmonary fibrosis, unspecified: Secondary | ICD-10-CM | POA: Diagnosis present

## 2018-02-17 DIAGNOSIS — R918 Other nonspecific abnormal finding of lung field: Secondary | ICD-10-CM | POA: Diagnosis not present

## 2018-02-17 DIAGNOSIS — Z7951 Long term (current) use of inhaled steroids: Secondary | ICD-10-CM

## 2018-02-17 DIAGNOSIS — Z79899 Other long term (current) drug therapy: Secondary | ICD-10-CM

## 2018-02-17 DIAGNOSIS — J449 Chronic obstructive pulmonary disease, unspecified: Secondary | ICD-10-CM | POA: Diagnosis not present

## 2018-02-17 DIAGNOSIS — Z87891 Personal history of nicotine dependence: Secondary | ICD-10-CM

## 2018-02-17 DIAGNOSIS — I5032 Chronic diastolic (congestive) heart failure: Secondary | ICD-10-CM | POA: Diagnosis present

## 2018-02-17 DIAGNOSIS — Z9841 Cataract extraction status, right eye: Secondary | ICD-10-CM | POA: Diagnosis not present

## 2018-02-17 DIAGNOSIS — I480 Paroxysmal atrial fibrillation: Secondary | ICD-10-CM | POA: Diagnosis not present

## 2018-02-17 DIAGNOSIS — G9340 Encephalopathy, unspecified: Secondary | ICD-10-CM | POA: Diagnosis present

## 2018-02-17 DIAGNOSIS — IMO0002 Reserved for concepts with insufficient information to code with codable children: Secondary | ICD-10-CM | POA: Diagnosis present

## 2018-02-17 DIAGNOSIS — E162 Hypoglycemia, unspecified: Secondary | ICD-10-CM

## 2018-02-17 LAB — CBC WITH DIFFERENTIAL/PLATELET
BASOS ABS: 0 10*3/uL (ref 0.0–0.1)
Basophils Relative: 0 %
EOS PCT: 3 %
Eosinophils Absolute: 0.2 10*3/uL (ref 0.0–0.7)
HCT: 27.1 % — ABNORMAL LOW (ref 39.0–52.0)
Hemoglobin: 8.5 g/dL — ABNORMAL LOW (ref 13.0–17.0)
Lymphocytes Relative: 15 %
Lymphs Abs: 1 10*3/uL (ref 0.7–4.0)
MCH: 23 pg — ABNORMAL LOW (ref 26.0–34.0)
MCHC: 31.4 g/dL (ref 30.0–36.0)
MCV: 73.2 fL — AB (ref 78.0–100.0)
MONO ABS: 0.8 10*3/uL (ref 0.1–1.0)
Monocytes Relative: 13 %
Neutro Abs: 4.3 10*3/uL (ref 1.7–7.7)
Neutrophils Relative %: 69 %
PLATELETS: 195 10*3/uL (ref 150–400)
RBC: 3.7 MIL/uL — ABNORMAL LOW (ref 4.22–5.81)
RDW: 18.5 % — AB (ref 11.5–15.5)
WBC: 6.2 10*3/uL (ref 4.0–10.5)

## 2018-02-17 LAB — COMPREHENSIVE METABOLIC PANEL
ALT: 28 U/L (ref 17–63)
ANION GAP: 6 (ref 5–15)
AST: 20 U/L (ref 15–41)
Albumin: 2.9 g/dL — ABNORMAL LOW (ref 3.5–5.0)
Alkaline Phosphatase: 61 U/L (ref 38–126)
BILIRUBIN TOTAL: 0.5 mg/dL (ref 0.3–1.2)
BUN: 20 mg/dL (ref 6–20)
CO2: 26 mmol/L (ref 22–32)
Calcium: 8.3 mg/dL — ABNORMAL LOW (ref 8.9–10.3)
Chloride: 101 mmol/L (ref 101–111)
Creatinine, Ser: 0.96 mg/dL (ref 0.61–1.24)
Glucose, Bld: 116 mg/dL — ABNORMAL HIGH (ref 65–99)
Potassium: 4.3 mmol/L (ref 3.5–5.1)
Sodium: 133 mmol/L — ABNORMAL LOW (ref 135–145)
TOTAL PROTEIN: 5.7 g/dL — AB (ref 6.5–8.1)

## 2018-02-17 LAB — BLOOD GAS, ARTERIAL
Acid-Base Excess: 0.9 mmol/L (ref 0.0–2.0)
BICARBONATE: 24.9 mmol/L (ref 20.0–28.0)
Drawn by: 28459
O2 Content: 3 L/min
O2 Saturation: 98.5 %
PH ART: 7.33 — AB (ref 7.350–7.450)
PO2 ART: 133 mmHg — AB (ref 83.0–108.0)
Patient temperature: 37
pCO2 arterial: 50.9 mmHg — ABNORMAL HIGH (ref 32.0–48.0)

## 2018-02-17 LAB — STREP PNEUMONIAE URINARY ANTIGEN: Strep Pneumo Urinary Antigen: NEGATIVE

## 2018-02-17 LAB — BRAIN NATRIURETIC PEPTIDE: B NATRIURETIC PEPTIDE 5: 169 pg/mL — AB (ref 0.0–100.0)

## 2018-02-17 LAB — PROCALCITONIN

## 2018-02-17 LAB — BASIC METABOLIC PANEL
ANION GAP: 6 (ref 5–15)
BUN: 21 mg/dL — AB (ref 6–20)
CALCIUM: 8 mg/dL — AB (ref 8.9–10.3)
CO2: 28 mmol/L (ref 22–32)
Chloride: 99 mmol/L — ABNORMAL LOW (ref 101–111)
Creatinine, Ser: 1.02 mg/dL (ref 0.61–1.24)
GLUCOSE: 153 mg/dL — AB (ref 65–99)
Potassium: 4.3 mmol/L (ref 3.5–5.1)
Sodium: 133 mmol/L — ABNORMAL LOW (ref 135–145)

## 2018-02-17 LAB — TYPE AND SCREEN
ABO/RH(D): A NEG
Antibody Screen: NEGATIVE

## 2018-02-17 LAB — GLUCOSE, CAPILLARY
GLUCOSE-CAPILLARY: 144 mg/dL — AB (ref 65–99)
GLUCOSE-CAPILLARY: 115 mg/dL — AB (ref 65–99)
GLUCOSE-CAPILLARY: 423 mg/dL — AB (ref 65–99)
Glucose-Capillary: 191 mg/dL — ABNORMAL HIGH (ref 65–99)
Glucose-Capillary: 300 mg/dL — ABNORMAL HIGH (ref 65–99)
Glucose-Capillary: 402 mg/dL — ABNORMAL HIGH (ref 65–99)

## 2018-02-17 LAB — TROPONIN I

## 2018-02-17 LAB — CBG MONITORING, ED
GLUCOSE-CAPILLARY: 84 mg/dL (ref 65–99)
Glucose-Capillary: 101 mg/dL — ABNORMAL HIGH (ref 65–99)
Glucose-Capillary: 123 mg/dL — ABNORMAL HIGH (ref 65–99)
Glucose-Capillary: 152 mg/dL — ABNORMAL HIGH (ref 65–99)
Glucose-Capillary: 177 mg/dL — ABNORMAL HIGH (ref 65–99)
Glucose-Capillary: 77 mg/dL (ref 65–99)
Glucose-Capillary: 90 mg/dL (ref 65–99)

## 2018-02-17 LAB — AMMONIA: Ammonia: 17 umol/L (ref 9–35)

## 2018-02-17 LAB — I-STAT CG4 LACTIC ACID, ED: LACTIC ACID, VENOUS: 0.54 mmol/L (ref 0.5–1.9)

## 2018-02-17 LAB — CORTISOL: CORTISOL PLASMA: 84.2 ug/dL

## 2018-02-17 LAB — SALICYLATE LEVEL

## 2018-02-17 LAB — ETHANOL: ALCOHOL ETHYL (B): 1 mg/dL (ref <10)

## 2018-02-17 LAB — TSH: TSH: 1.01 u[IU]/mL (ref 0.350–4.500)

## 2018-02-17 LAB — ACETAMINOPHEN LEVEL: Acetaminophen (Tylenol), Serum: <10 ug/mL — ABNORMAL LOW (ref 10–30)

## 2018-02-17 LAB — T4, FREE: Free T4: 0.57 ng/dL — ABNORMAL LOW (ref 0.82–1.77)

## 2018-02-17 MED ORDER — HYDROCODONE-ACETAMINOPHEN 5-325 MG PO TABS: 1.0000 | ORAL_TABLET | ORAL | Status: DC | PRN

## 2018-02-17 MED ORDER — VANCOMYCIN HCL IN DEXTROSE 1-5 GM/200ML-% IV SOLN
1000.0000 mg | Freq: Two times a day (BID) | INTRAVENOUS | Status: DC
  Administered 2018-02-17 – 2018-02-18 (×2): 1000 mg via INTRAVENOUS
  Filled 2018-02-17 (×2): qty 200

## 2018-02-17 MED ORDER — BUDESONIDE 0.5 MG/2ML IN SUSP
0.5000 mg | Freq: Two times a day (BID) | RESPIRATORY_TRACT | Status: DC
  Administered 2018-02-17 – 2018-02-20 (×7): 0.5 mg via RESPIRATORY_TRACT
  Filled 2018-02-17 (×7): qty 2

## 2018-02-17 MED ORDER — BENZTROPINE MESYLATE 1 MG PO TABS
ORAL_TABLET | ORAL | Status: AC
  Filled 2018-02-17: qty 1

## 2018-02-17 MED ORDER — ALBUTEROL SULFATE (2.5 MG/3ML) 0.083% IN NEBU
2.5000 mg | INHALATION_SOLUTION | RESPIRATORY_TRACT | Status: DC | PRN
Start: 2018-02-17 — End: 2018-02-20

## 2018-02-17 MED ORDER — IPRATROPIUM-ALBUTEROL 0.5-2.5 (3) MG/3ML IN SOLN
3.0000 mL | Freq: Once | RESPIRATORY_TRACT | Status: AC
  Administered 2018-02-17: 3 mL via RESPIRATORY_TRACT
  Filled 2018-02-17: qty 3

## 2018-02-17 MED ORDER — SODIUM CHLORIDE 0.9 % IV SOLN
2.0000 g | Freq: Once | INTRAVENOUS | Status: AC
  Administered 2018-02-17: 2 g via INTRAVENOUS
  Filled 2018-02-17: qty 2

## 2018-02-17 MED ORDER — PRAVASTATIN SODIUM 40 MG PO TABS
80.0000 mg | ORAL_TABLET | Freq: Every day | ORAL | Status: DC
  Administered 2018-02-17 – 2018-02-19 (×3): 80 mg via ORAL
  Filled 2018-02-17: qty 2
  Filled 2018-02-17: qty 1
  Filled 2018-02-17 (×2): qty 2

## 2018-02-17 MED ORDER — DEXTROSE 10 % IV SOLN
Freq: Once | INTRAVENOUS | Status: AC
  Administered 2018-02-17: 50 mL/h via INTRAVENOUS

## 2018-02-17 MED ORDER — ONDANSETRON HCL 4 MG PO TABS: 4.0000 mg | ORAL_TABLET | Freq: Four times a day (QID) | ORAL | Status: DC | PRN

## 2018-02-17 MED ORDER — SODIUM CHLORIDE 0.9 % IV BOLUS
500.0000 mL | Freq: Once | INTRAVENOUS | Status: AC
  Administered 2018-02-17: 500 mL via INTRAVENOUS

## 2018-02-17 MED ORDER — IPRATROPIUM BROMIDE 0.02 % IN SOLN
0.5000 mg | Freq: Three times a day (TID) | RESPIRATORY_TRACT | Status: DC
  Administered 2018-02-17 – 2018-02-20 (×10): 0.5 mg via RESPIRATORY_TRACT
  Filled 2018-02-17 (×11): qty 2.5

## 2018-02-17 MED ORDER — DEXTROSE 50 % IV SOLN
0.5000 | Freq: Once | INTRAVENOUS | Status: AC
  Administered 2018-02-17: 25 mL via INTRAVENOUS

## 2018-02-17 MED ORDER — SODIUM CHLORIDE 0.9 % IV SOLN: 250.0000 mL | INTRAVENOUS | Status: DC | PRN

## 2018-02-17 MED ORDER — SODIUM CHLORIDE 0.9% FLUSH
3.0000 mL | Freq: Two times a day (BID) | INTRAVENOUS | Status: DC
  Administered 2018-02-17 – 2018-02-18 (×4): 3 mL via INTRAVENOUS

## 2018-02-17 MED ORDER — APIXABAN 2.5 MG PO TABS
2.5000 mg | ORAL_TABLET | Freq: Two times a day (BID) | ORAL | Status: DC
  Administered 2018-02-17 – 2018-02-20 (×7): 2.5 mg via ORAL
  Filled 2018-02-17 (×7): qty 1

## 2018-02-17 MED ORDER — GLUCAGON HCL RDNA (DIAGNOSTIC) 1 MG IJ SOLR
1.0000 mg | Freq: Once | INTRAMUSCULAR | Status: AC
  Administered 2018-02-17: 1 mg via INTRAVENOUS
  Filled 2018-02-17: qty 1

## 2018-02-17 MED ORDER — SODIUM CHLORIDE 0.9% FLUSH
3.0000 mL | Freq: Two times a day (BID) | INTRAVENOUS | Status: DC
  Administered 2018-02-17 – 2018-02-18 (×2): 3 mL via INTRAVENOUS

## 2018-02-17 MED ORDER — IPRATROPIUM BROMIDE 0.02 % IN SOLN: 0.5000 mg | Freq: Three times a day (TID) | RESPIRATORY_TRACT | Status: DC

## 2018-02-17 MED ORDER — ACETAMINOPHEN 650 MG RE SUPP: 650.0000 mg | Freq: Four times a day (QID) | RECTAL | Status: DC | PRN

## 2018-02-17 MED ORDER — SODIUM CHLORIDE 0.9% FLUSH: 3.0000 mL | INTRAVENOUS | Status: DC | PRN

## 2018-02-17 MED ORDER — INSULIN ASPART 100 UNIT/ML ~~LOC~~ SOLN
9.0000 [IU] | Freq: Once | SUBCUTANEOUS | Status: AC
  Administered 2018-02-17: 9 [IU] via SUBCUTANEOUS

## 2018-02-17 MED ORDER — ORAL CARE MOUTH RINSE
15.0000 mL | Freq: Two times a day (BID) | OROMUCOSAL | Status: DC
  Administered 2018-02-17 – 2018-02-20 (×7): 15 mL via OROMUCOSAL

## 2018-02-17 MED ORDER — ASPIRIN EC 81 MG PO TBEC
81.0000 mg | DELAYED_RELEASE_TABLET | Freq: Every day | ORAL | Status: DC
Start: 2018-02-17 — End: 2018-02-20
  Administered 2018-02-17 – 2018-02-20 (×4): 81 mg via ORAL
  Filled 2018-02-17 (×4): qty 1

## 2018-02-17 MED ORDER — VANCOMYCIN HCL 10 G IV SOLR
1500.0000 mg | Freq: Once | INTRAVENOUS | Status: AC
  Administered 2018-02-17: 1500 mg via INTRAVENOUS
  Filled 2018-02-17 (×2): qty 1500

## 2018-02-17 MED ORDER — BUDESONIDE 0.5 MG/2ML IN SUSP: 0.5000 mg | Freq: Two times a day (BID) | RESPIRATORY_TRACT | Status: DC

## 2018-02-17 MED ORDER — HYDROCORTISONE NA SUCCINATE PF 100 MG IJ SOLR
100.0000 mg | Freq: Once | INTRAMUSCULAR | Status: AC
  Administered 2018-02-17: 100 mg via INTRAVENOUS
  Filled 2018-02-17: qty 2

## 2018-02-17 MED ORDER — PIPERACILLIN-TAZOBACTAM 3.375 G IVPB
3.3750 g | Freq: Once | INTRAVENOUS | Status: DC
  Administered 2018-02-17: 3.375 g via INTRAVENOUS
  Filled 2018-02-17: qty 50

## 2018-02-17 MED ORDER — SODIUM CHLORIDE 0.9 % IV SOLN
1.0000 g | Freq: Three times a day (TID) | INTRAVENOUS | Status: DC
  Administered 2018-02-17 – 2018-02-18 (×3): 1 g via INTRAVENOUS
  Filled 2018-02-17 (×4): qty 1

## 2018-02-17 MED ORDER — ACETAMINOPHEN 325 MG PO TABS: 650.0000 mg | ORAL_TABLET | Freq: Four times a day (QID) | ORAL | Status: DC | PRN

## 2018-02-17 MED ORDER — DEXTROSE 50 % IV SOLN
25.0000 mL | Freq: Once | INTRAVENOUS | Status: AC
  Administered 2018-02-17: 25 mL via INTRAVENOUS

## 2018-02-17 MED ORDER — METHYLPREDNISOLONE SODIUM SUCC 125 MG IJ SOLR
60.0000 mg | Freq: Four times a day (QID) | INTRAMUSCULAR | Status: DC
  Administered 2018-02-17: 60 mg via INTRAVENOUS
  Filled 2018-02-17: qty 2

## 2018-02-17 MED ORDER — ONDANSETRON HCL 4 MG/2ML IJ SOLN: 4.0000 mg | Freq: Four times a day (QID) | INTRAMUSCULAR | Status: DC | PRN

## 2018-02-17 MED ORDER — PANTOPRAZOLE SODIUM 40 MG PO TBEC
40.0000 mg | DELAYED_RELEASE_TABLET | Freq: Every day | ORAL | Status: DC
  Administered 2018-02-17 – 2018-02-20 (×4): 40 mg via ORAL
  Filled 2018-02-17 (×4): qty 1

## 2018-02-17 MED ORDER — INSULIN ASPART 100 UNIT/ML ~~LOC~~ SOLN
0.0000 [IU] | Freq: Three times a day (TID) | SUBCUTANEOUS | Status: DC
  Administered 2018-02-17: 2 [IU] via SUBCUTANEOUS
  Administered 2018-02-17 – 2018-02-18 (×2): 5 [IU] via SUBCUTANEOUS
  Administered 2018-02-18: 2 [IU] via SUBCUTANEOUS
  Administered 2018-02-18: 9 [IU] via SUBCUTANEOUS

## 2018-02-17 MED ORDER — ATROPINE SULFATE 1 MG/10ML IJ SOSY
0.5000 mg | PREFILLED_SYRINGE | Freq: Once | INTRAMUSCULAR | Status: AC
  Administered 2018-02-17: 0.5 mg via INTRAVENOUS
  Filled 2018-02-17: qty 10

## 2018-02-17 MED ORDER — DEXTROSE 50 % IV SOLN
INTRAVENOUS | Status: AC
  Administered 2018-02-17: 25 mL via INTRAVENOUS
  Filled 2018-02-17: qty 50

## 2018-02-17 NOTE — ED Notes (Signed)
Pt son Cordelro Gautreau updated on pt condition via telephone by RN. States he and pt's spouse will come to hospital later this am.

## 2018-02-17 NOTE — Progress Notes (Signed)
Patient seen and examined.  Admitted after midnight secondary to acute hypoglycemic event with altered mental status, hypothermia and CBG's in the 32 range.  Patient with a recent hospitalization secondary to COPD and bronchiectasis exacerbation.  Properly treated and at that time his sugar was difficult to control in the 500 range.  He was discharged on insulin regimen (A1c above 13), based on records from outpatient he was not exactly compliant with medication regimen and ended having further adjustment on his insulin therapy.  He appears to have taken insulin after the most recent adjustment and was no longer on steroids (previously prescribed in tapering fashion for COPD; which was most likely keeping CBG elevated).  All these on top of very bad eating habits (essentially eating twice a day only, sometime skipping meals and just carbohydrates) and this probably lead to experience a hypoglycemic event.  Patient reported feeling good before this pain, no having any productive cough, no fever, no changes in his breathing.  She has a great indistinct currently presenting with a right middle lobe opacity that triggers concern for pneumonia.  At this moment I doubt that he has underlying infection; and I believe that his symptoms on presentations are all secondary to hypoglycemic event.   Sink is currently no wheezing we will stop Solu-Medrol.  Continue to use nebulizer treatments for his COPD (as he is normally takes at home), complete antibiotics as has been ordered for today and then stop that and reassess the need of any antibiotic therapy down the road.  For further information and details on his admission please refer to H&P dictated by Dr. Myna Hidalgo on 02/17/2018.  Barton Dubois MD [phone]

## 2018-02-17 NOTE — Progress Notes (Signed)
Pharmacy Antibiotic Note  Edward [name] is a 83 y.o. male admitted on 02/17/2018 with sepsis.  Pharmacy has been consulted for Vancomycin and Cefepime dosing.  Plan:  Vancomycin 1000mg  IV q12h Check trough at steady state Cefepime 1gm IV q8h Monitor labs, renal fxn, progress and c/s Deescalate ABX when improved / appropriate.    Height: 5\' 5"  (165.1 cm) Weight: 190 lb 14.7 oz (86.6 kg) IBW/kg (Calculated) : 61.5  Temp (24hrs), Avg:94.5 F (34.7 C), Min:91.9 F (33.3 C), Max:97.5 F (36.4 C)  Recent Labs  Lab 02/17/18 0045 02/17/18 0046 02/17/18 0421  WBC 6.2  --   --   CREATININE 0.96  --  1.02  LATICACIDVEN  --  0.54  --     Estimated Creatinine Clearance: 58.4 mL/min (by C-G formula based on SCr of 1.02 mg/dL).    Allergies  Allergen Reactions  . Ace Inhibitors Other (See Comments)    Hyperkalemia--07/23/2013:patient states not familiar with the following allergy   Antimicrobials this admission: vancomycin 5/19 >>  Cefepime 5/19 >>   Dose adjustments this admission:  Microbiology results:  BCx: pending  UCx: pending   Sputum:    MRSA PCR:   Thank you for allowing pharmacy to be a part of this patient's care.  Hart Robinsons A 02/17/2018 10:11 AM

## 2018-02-17 NOTE — Progress Notes (Signed)
ANTIBIOTIC CONSULT NOTE-Preliminary  Pharmacy Consult for Vancomycin and Cefepime Indication: Sepsis  Allergies  Allergen Reactions  . Ace Inhibitors Other (See Comments)    Hyperkalemia--07/23/2013:patient states not familiar with the following allergy    Patient Measurements: Height: 5\' 5"  (165.1 cm) Weight: 188 lb (85.3 kg) IBW/kg (Calculated) : 61.5  Vital Signs: Temp: 93.2 F (34 C) (05/19 0230) Temp Source: Rectal (05/19 0021) BP: 98/82 (05/19 0230) Pulse Rate: 59 (05/19 0230)  Labs: Recent Labs    02/17/18 0045  WBC 6.2  HGB 8.5*  PLT 195  CREATININE 0.96    Estimated Creatinine Clearance: 61.6 mL/min (by C-G formula based on SCr of 0.96 mg/dL).  No results for input(s): VANCOTROUGH, VANCOPEAK, VANCORANDOM, GENTTROUGH, GENTPEAK, GENTRANDOM, TOBRATROUGH, TOBRAPEAK, TOBRARND, AMIKACINPEAK, AMIKACINTROU, AMIKACIN in the last 72 hours.   Microbiology: Recent Results (from the past 720 hour(s))  STREP GROUP A AG, W/REFLEX TO CULT     Status: None   Collection Time: 02/01/18 10:57 AM  Result Value Ref Range Status   Streptococcus, Group A Screen (Direct) NONE DETECTED  Final  Culture, Group A Strep     Status: None   Collection Time: 02/01/18 10:57 AM  Result Value Ref Range Status   MICRO NUMBER: [id number]  Final   SPECIMEN QUALITY: ADEQUATE  Final   SOURCE: NOT GIVEN  Final   STATUS: FINAL  Final   RESULT: No group A Streptococcus isolated  Final  MRSA PCR Screening     Status: None   Collection Time: 02/01/18 10:40 PM  Result Value Ref Range Status   MRSA by PCR NEGATIVE NEGATIVE Final    Comment:        The GeneXpert MRSA Assay (FDA approved for NASAL specimens only), is one component of a comprehensive MRSA colonization surveillance program. It is not intended to diagnose MRSA infection nor to guide or monitor treatment for MRSA infections. Performed at [facility], [address], [place], [place] [address]   Culture, blood (Routine X 2) w  Reflex to ID Panel     Status: None   Collection Time: 02/01/18 11:47 PM  Result Value Ref Range Status   Specimen Description BLOOD RIGHT ARM  Final   Special Requests   Final    BOTTLES DRAWN AEROBIC AND ANAEROBIC Blood Culture adequate volume   Culture   Final    NO GROWTH 5 DAYS Performed at [facility], [address] [place], [place] [address]    Report Status 02/07/2018 FINAL  Final  Culture, blood (Routine X 2) w Reflex to ID Panel     Status: None   Collection Time: 02/02/18 12:00 AM  Result Value Ref Range Status   Specimen Description BLOOD RIGHT HAND  Final   Special Requests   Final    BOTTLES DRAWN AEROBIC AND ANAEROBIC Blood Culture adequate volume   Culture   Final    NO GROWTH 5 DAYS Performed at [facility], [address] [place], [place] [address]    Report Status 02/07/2018 FINAL  Final  Blood culture (routine x 2)     Status: None (Preliminary result)   Collection Time: 02/17/18 12:35 AM  Result Value Ref Range Status   Specimen Description BLOOD LEFT ARM  Final   Special Requests   Final    BOTTLES DRAWN AEROBIC AND ANAEROBIC Blood Culture adequate volume Performed at [facility], [address], [place], [place] [address]    Culture PENDING  Incomplete   Report Status PENDING  Incomplete  Blood culture (routine x 2)     Status: None (Preliminary result)   Collection Time: 02/17/18 12:50 AM  Result Value Ref Range Status   Specimen Description LEFT ANTECUBITAL  Final   Special Requests   Final    BOTTLES DRAWN AEROBIC AND ANAEROBIC Blood Culture adequate volume Performed at [facility], [address], [place], [place] [address]    Culture PENDING  Incomplete   Report Status PENDING  Incomplete    Medical History: Past Medical History:  Diagnosis Date  . Allergy    Rhinitis  . Bronchitis   . Chronic respiratory failure ([place])   . Colon polyps   . COPD (chronic obstructive pulmonary disease) ([place])   . Diabetes mellitus   . Elevated lipids    . Hypercholesterolemia   . Hypertension   . Noncompliance   . On home O2    2L N/C   . PSA elevation   . Pulmonary fibrosis ([place])   . Vitamin D deficiency     Medications:  Cefepime 1 Gm IV x 1 ED dose ordered by the EDP  Assessment: 83 yo male brought by EMS to the ED with lethargy, mental confusion, bradycardia and hypothermia. Chest x-ray shows possible pneumonia. Pharmacy has been consulted for vancomycin and cefepime dosing for HCAP.  Goal of Therapy:  Vancomycin troughs 15-20 mcg/ml Eradicate infection  Plan:  Preliminary review of pertinent patient information completed.  Protocol will be initiated with a loading dose of Vancomycin 1500 mg IV.  Forestine Na clinical pharmacist will complete review during morning rounds to assess patient and finalize treatment regimen if needed.  Norberto Sorenson, [facility] 02/17/2018,3:46 AM

## 2018-02-17 NOTE — ED Triage Notes (Signed)
Ems was called out to pt's residence for low blood sugar and altered mental status, upon ems arrival blood sugar was 32, pt was given one amp of D50 with no improvement in mental status, blood sugar increased to 184, pt sinus brady on monitor with rate of 45-55, pt given 500 cc NS fluid bolus with no change in heart rate, pt given 0.5mg  atropine with improvement to hr to 66, , pt was also given one 2.5 mg albuterol treatment for wheezing while enroute to er,

## 2018-02-17 NOTE — H&P (Signed)
History and Physical    Edward [name] BJY:[id number] DOB: Sep 06, 1935 DOA: 02/17/2018  PCP: Orlena Sheldon, PA-C   Patient coming from: Home   Chief Complaint: Confusion, lethargy   HPI: Edward [name] is a 83 y.o. male with medical history significant for COPD with chronic respiratory failure, insulin-dependent diabetes mellitus, chronic diastolic CHF, hypertension, now presenting to the emergency department with lethargy and confusion.  Patient was reportedly in his usual state last night, but was noted early this morning to be standing in the bathroom, confused and lethargic.  EMS was called and the patient was found to have CBG of 32.  Heart rate was 45.  He was treated with 50% dextrose and 0.5 mg atropine, as well as albuterol neb prior to arrival in the ED.  ED Course: Upon arrival to the ED, patient is found to be hypothermic to 33.7 C, bradycardic in the mid 50s, and with vitals otherwise stable.  EKG features a sinus rhythm with rate 61, noncontrast head CT is negative for acute intracranial abnormality, and chest x-ray is notable for airspace opacity at the right base concerning for pneumonia.  CBC features a slight hyponatremia and CBC is notable for microcytic anemia with hemoglobin of 8.5.  Lactic acid is reassuringly normal, troponin is undetectable, and BNP is elevated 269.  Blood cultures were collected, warming blanket applied, 500 cc normal saline given, and the patient was treated with atropine, glucagon, 50% dextrose x2, vancomycin, and Zosyn.  Blood pressure remains low-normal, heart rate remains in the 50s to 60s, temperature is increasing, and patient will be admitted to the stepdown unit.  Review of Systems:  Unable to complete ROS secondary to patient's clinical condition.   Past Medical History:  Diagnosis Date  . Allergy    Rhinitis  . Bronchitis   . Chronic respiratory failure ([place])   . Colon polyps   . COPD (chronic obstructive pulmonary disease) ([place])   .  Diabetes mellitus   . Elevated lipids   . Hypercholesterolemia   . Hypertension   . Noncompliance   . On home O2    2L N/C   . PSA elevation   . Pulmonary fibrosis ([place])   . Vitamin D deficiency     Past Surgical History:  Procedure Laterality Date  . CATARACT EXTRACTION W/PHACO  06/25/2012   Procedure: CATARACT EXTRACTION PHACO AND INTRAOCULAR LENS PLACEMENT (IOC);  Surgeon: Elta Guadeloupe T. Gershon Crane, MD;  Location: [facility];  Service: Ophthalmology;  Laterality: Left;  CDE=19.01  . CATARACT EXTRACTION W/PHACO  07/09/2012   Procedure: CATARACT EXTRACTION PHACO AND INTRAOCULAR LENS PLACEMENT (IOC);  Surgeon: Elta Guadeloupe T. Gershon Crane, MD;  Location: [facility];  Service: Ophthalmology;  Laterality: Right;  CDE: 20.09     reports that he quit smoking about 5 years ago. His smoking use included cigarettes. He has a 90.00 pack-year smoking history. He has never used smokeless tobacco. He reports that he does not drink alcohol or use drugs.  Allergies  Allergen Reactions  . Ace Inhibitors Other (See Comments)    Hyperkalemia--07/23/2013:patient states not familiar with the following allergy    Family History  Problem Relation Age of Onset  . Heart disease Mother   . CAD Other   . Diabetes Other      Prior to Admission medications   Medication Sig Start Date End Date Taking? Authorizing Provider  albuterol (PROVENTIL) (2.5 MG/3ML) 0.083% nebulizer solution INHALE 1 VIAL VIA NEBULIZER EVERY 6 HOURS AS NEEDED FOR WHEEZING OR SHORTNESS  OF BREATH 07/19/17   Orlena Sheldon, PA-C  apixaban (ELIQUIS) 2.5 MG TABS tablet Take 1 tablet (2.5 mg total) by mouth 2 (two) times daily. 02/05/18   Orlena Sheldon, PA-C  aspirin EC 81 MG tablet Take 81 mg by mouth daily.    [provider]  budesonide (PULMICORT) 0.5 MG/2ML nebulizer solution Take 2 mLs (0.5 mg total) by nebulization 2 (two) times daily. 02/05/18   Dena Billet B, PA-C  cloNIDine (CATAPRES) 0.1 MG tablet TAKE 1 TABLET BY MOUTH 2 TIMES A DAY 01/28/18   Dena Billet B, PA-C  diltiazem (CARDIZEM CD) 240 MG 24 hr capsule TAKE 1 CAPSULE BY MOUTH DAILY 12/03/17   Orlena Sheldon, PA-C  furosemide (LASIX) 40 MG tablet Take 1 tablet (40 mg total) by mouth every other day. Patient not taking: Reported on 02/07/2018 09/26/17 09/26/18  [name], MD  guaiFENesin (MUCINEX) 600 MG 12 hr tablet Take 1 tablet (600 mg total) by mouth 2 (two) times daily. 02/04/18   Barton Dubois, MD  insulin detemir (LEVEMIR) 100 unit/ml SOLN Inject 0.35 mLs (35 Units total) into the skin 2 (two) times daily. 02/04/18   Barton Dubois, MD  insulin lispro (HUMALOG) 100 UNIT/ML KwikPen Junior Inject 0.2 mLs (20 Units total) into the skin 3 (three) times daily. 02/05/18   Dena Billet B, PA-C  ipratropium (ATROVENT) 0.02 % nebulizer solution Take 2.5 mLs (0.5 mg total) by nebulization 4 (four) times daily. 02/05/18   Dena Billet B, PA-C  losartan (COZAAR) 100 MG tablet TAKE 1 TABLET BY MOUTH DAILY 12/31/17   Dena Billet B, PA-C  metoprolol tartrate (LOPRESSOR) 25 MG tablet TAKE 1 TABLET BY MOUTH TWICE DAILY 12/31/17   Orlena Sheldon, PA-C  omeprazole (PRILOSEC) 20 MG capsule Take 1 capsule (20 mg total) by mouth daily. Patient not taking: Reported on 02/06/2018 02/04/18   Barton Dubois, MD  OXYGEN Inhale 3 L into the lungs daily.     [provider]  potassium chloride SA (K-DUR,KLOR-CON) 20 MEQ tablet Take 1 tablet (20 mEq total) by mouth every other day. Take with lasix. 09/26/17   Johnson, Clanford L, MD  pravastatin (PRAVACHOL) 80 MG tablet Take 1 tablet (80 mg total) by mouth at bedtime. Patient not taking: Reported on 02/07/2018 02/05/18   Orlena Sheldon, PA-C    Physical Exam: Vitals:   02/17/18 0132 02/17/18 0200 02/17/18 0206 02/17/18 0230  BP:  139/68  98/82  Pulse: 76  (!) 59 (!) 59  Resp: (!) 22 15 12 11   Temp: (!) 92.3 F (33.5 C) (!) 92.7 F (33.7 C) (!) 92.7 F (33.7 C) (!) 93.2 F (34 C)  TempSrc:      SpO2: 100%  100% 99%  Weight:      Height:           Constitutional: Lethargic, no diaphoresis, no pallor  Eyes: PERTLA, lids and conjunctivae normal ENMT: Mucous membranes are moist. Posterior pharynx clear of any exudate or lesions.   Neck: normal, supple, no masses, no thyromegaly Respiratory: Diminished bilaterally, diffuse wheezes, prolonged expiratory phase. No accessory muscle use.  Cardiovascular: S1 & S2 heard, regular rate and rhythm. Trace pretibial edema bilaterally. Abdomen: Mild distension, soft, no tenderness. Bowel sounds active.  Musculoskeletal: no clubbing / cyanosis. No joint deformity upper and lower extremities.   Skin: no significant rashes, lesions, ulcers. Cool. Neurologic: No facial asymmetry. Moving all extremities.  Psychiatric: Somnolent. Easily roused.      Labs  on Admission: I have personally reviewed following labs and imaging studies  CBC: Recent Labs  Lab 02/17/18 0045  WBC 6.2  NEUTROABS 4.3  HGB 8.5*  HCT 27.1*  MCV 73.2*  PLT 563   Basic Metabolic Panel: Recent Labs  Lab 02/17/18 0045  NA 133*  K 4.3  CL 101  CO2 26  GLUCOSE 116*  BUN 20  CREATININE 0.96  CALCIUM 8.3*   GFR: Estimated Creatinine Clearance: 61.6 mL/min (by C-G formula based on SCr of 0.96 mg/dL). Liver Function Tests: Recent Labs  Lab 02/17/18 0045  AST 20  ALT 28  ALKPHOS 61  BILITOT 0.5  PROT 5.7*  ALBUMIN 2.9*   No results for input(s): LIPASE, AMYLASE in the last 168 hours. Recent Labs  Lab 02/17/18 0045  AMMONIA 17   Coagulation Profile: No results for input(s): INR, PROTIME in the last 168 hours. Cardiac Enzymes: Recent Labs  Lab 02/17/18 0045  TROPONINI <0.03   BNP (last 3 results) No results for input(s): PROBNP in the last 8760 hours. HbA1C: No results for input(s): HGBA1C in the last 72 hours. CBG: Recent Labs  Lab 02/17/18 0033 02/17/18 0048 02/17/18 0112 02/17/18 0155 02/17/18 0239  GLUCAP 123* 101* 77 90 152*   Lipid Profile: No results for input(s): CHOL, HDL,  LDLCALC, TRIG, CHOLHDL, LDLDIRECT in the last 72 hours. Thyroid Function Tests: Recent Labs    02/17/18 0045  TSH 1.010   Anemia Panel: No results for input(s): VITAMINB12, FOLATE, FERRITIN, TIBC, IRON, RETICCTPCT in the last 72 hours. Urine analysis:    Component Value Date/Time   COLORURINE STRAW (A) 02/06/2018 1142   APPEARANCEUR CLEAR 02/06/2018 1142   LABSPEC 1.013 02/06/2018 1142   PHURINE 6.0 02/06/2018 1142   GLUCOSEU >=500 (A) 02/06/2018 1142   HGBUR SMALL (A) 02/06/2018 1142   BILIRUBINUR NEGATIVE 02/06/2018 1142   KETONESUR NEGATIVE 02/06/2018 1142   PROTEINUR NEGATIVE 02/06/2018 1142   NITRITE NEGATIVE 02/06/2018 1142   LEUKOCYTESUR NEGATIVE 02/06/2018 1142   Sepsis Labs: @LABRCNTIP (procalcitonin:4,lacticidven:4) ) Recent Results (from the past 240 hour(s))  Blood culture (routine x 2)     Status: None (Preliminary result)   Collection Time: 02/17/18 12:35 AM  Result Value Ref Range Status   Specimen Description BLOOD LEFT ARM  Final   Special Requests   Final    BOTTLES DRAWN AEROBIC AND ANAEROBIC Blood Culture adequate volume Performed at [facility], [address] [place], [place] [address]    Culture PENDING  Incomplete   Report Status PENDING  Incomplete  Blood culture (routine x 2)     Status: None (Preliminary result)   Collection Time: 02/17/18 12:50 AM  Result Value Ref Range Status   Specimen Description LEFT ANTECUBITAL  Final   Special Requests   Final    BOTTLES DRAWN AEROBIC AND ANAEROBIC Blood Culture adequate volume Performed at [facility], [address], [place], [place] [address]    Culture PENDING  Incomplete   Report Status PENDING  Incomplete     Radiological Exams on Admission: Ct Head Wo Contrast  Result Date: 02/17/2018 CLINICAL DATA:  Acute onset of altered mental status. Hypoglycemia. EXAM: CT HEAD WITHOUT CONTRAST TECHNIQUE: Contiguous axial images were obtained from the base of the skull through the vertex without  intravenous contrast. COMPARISON:  CT of the head performed 09/25/2017, and MRI of the brain performed 09/26/2017 FINDINGS: Brain: No evidence of acute infarction, hemorrhage, hydrocephalus, extra-axial collection or mass lesion / mass effect. Prominence of the ventricles  and sulci reflects mild to moderate cortical volume loss. Mild cerebellar atrophy is noted. Mild periventricular and subcortical white matter change likely reflects small vessel ischemic microangiopathy. The brainstem and fourth ventricle are within normal limits. The basal ganglia are unremarkable in appearance. The cerebral hemispheres demonstrate grossly normal gray-white differentiation. No mass effect or midline shift is seen. Vascular: No hyperdense vessel or unexpected calcification. Skull: There is no evidence of fracture; visualized osseous structures are unremarkable in appearance. Sinuses/Orbits: The orbits are within normal limits. The paranasal sinuses and mastoid air cells are well-aerated. Other: No significant soft tissue abnormalities are seen. IMPRESSION: 1. No acute intracranial pathology seen on CT. 2. Mild to moderate cortical volume loss and scattered small vessel ischemic microangiopathy. Electronically Signed   By: Garald Balding M.D.   On: 02/17/2018 02:08   Dg Chest Portable 1 View  Result Date: 02/17/2018 CLINICAL DATA:  Acute onset of altered mental status and hypoglycemia. EXAM: PORTABLE CHEST 1 VIEW COMPARISON:  Chest radiograph performed 02/01/2018 FINDINGS: The lungs are well-aerated. Medial right basilar airspace opacity is concerning for pneumonia. Mild vascular congestion is noted. There is no evidence of pleural effusion or pneumothorax. The cardiomediastinal silhouette is within normal limits. No acute osseous abnormalities are seen. IMPRESSION: Medial right basilar airspace opacity is concerning for pneumonia. Mild vascular congestion noted. Electronically Signed   By: Garald Balding M.D.   On: 02/17/2018  02:09    EKG: Independently reviewed. Sinus rhythm, rate 61.   Assessment/Plan   1. Sepsis secondary to HCAP  - Presents with lethargy and confusion, found to be hypoglycemia and hypothermic with possible PNA on CXR  - Blood cultures collected in ED and empiric broad-spectrum abx started  - Continue empiric vancomycin and cefepime while following cultures and clinical course, check procalcitonin and strep pneumo antigen    2. Hypoglycemia; IDDM  - Presents with lethargy and confusion, found to have CBG 32 by EMS and treated with D50% prior to arrival  - Given additional dextrose in ED  - A1c was 13.3% earlier this month  - Secondary to sepsis and/or recent increase in insulin  - Follow CBG's, hold insulin for now, continue prn dextrose    3. Acute encephalopathy  - Lethargic and confused on admission  - Head CT negative for acute findings  - Likely secondary to hypoglycemia, hypothermia, sepsis  - Treat sepsis and hypothermia, hypoglycemia    4. Microcytic anemia  - Hgb is 8.5 on admission, lower than recent priors in 9.5-range, no bleeding  - Type and screen, monitor    5. COPD with exacerbation; chronic hypoxic/hypercarbic respiratory failure  - Wheezes noted, no acute distress  - Continue nebs, continue supplemental O2, start systemic steroids   6. Hypertension  - BP low-normal in ED  - Hold antihypertensives until condition improves    7. Paroxysmal atrial fibrillation  - In a sinus rhythm on admission  - CHADS-VASc at least 5 (age x2, CHF, DM, HTN)  - Continue Eliquis, hold diltiazem and metoprolol given bradycardia prior to arrival and until BP improves   8. Chronic diastolic CHF  - Appears well-compensated, likely hypervolemic  - Hold ARB and beta-blocker initially in setting of sepsis with low-normal BP  - Follow daily wt and I/O's  - Received 500 cc with EMS and another 500 cc in ED    9. Bradycardia  - Sinus rhythm on EKG, treated by EMS with atropine for  rate in mid 40's, given additional atropine in ED  -  Secondary to hypothermia  - Hold beta-blocker and CCB, continue cardiac monitoring, continue warming blankets    DVT prophylaxis: Eliquis  Code Status: Full  Family Communication: Family not available at time of admission Consults called: None Admission status: Inpatient    Vianne Bulls, MD [facility] Pager [phone]  If 7PM-7AM, please contact night-coverage [email or web address] Password [email or web address]  02/17/2018, 2:59 AM

## 2018-02-17 NOTE — ED Provider Notes (Signed)
[facility] EMERGENCY DEPARTMENT Provider Note   CSN: [id number] Arrival date & time: 02/17/18  0013     History   Chief Complaint Chief Complaint  Patient presents with  . Altered Mental Status    HPI Edward [name] is a 83 y.o. male.  Level 5 caveat for altered mental status.  Patient from home with acute onset of mental status change and low blood sugar.  Family reports patient was last seen normal approximately 10 PM.  He was found standing in the bathroom confused.  EMS reports initial blood sugar was 32 he was given an amp of D50.  Blood sugar increased to 184.  He is also found to be bradycardic in the 40s with a sinus rhythm.  He is given half milligram of atropine with improvement into the 60s.  He is also given a nebulizer.  Patient is confused and does not know why he is here.  He is normally alert and oriented x3.  He denies any pain.  He has a history of COPD and pulmonary fibrosis on chronic oxygen.  He has a history of diabetes secondary to chronic steroid use.  He denies any intentional overdose.  He denies any recent fever.  No fall or trauma.  Patient does take both the beta-blocker and calcium channel blocker.  Though there is no history to suggest intentional overdose of these medications or his insulin.  The history is provided by the patient and the EMS personnel.  Altered Mental Status      Past Medical History:  Diagnosis Date  . Allergy    Rhinitis  . Bronchitis   . Chronic respiratory failure ([place])   . Colon polyps   . COPD (chronic obstructive pulmonary disease) ([place])   . Diabetes mellitus   . Elevated lipids   . Hypercholesterolemia   . Hypertension   . Noncompliance   . On home O2    2L N/C   . PSA elevation   . Pulmonary fibrosis ([place])   . Vitamin D deficiency     Patient Active Problem List   Diagnosis Date Noted  . Gastroesophageal reflux disease   . CAP (community acquired pneumonia) 02/01/2018  . Acute encephalopathy 09/25/2017  .  Acute confusion 09/25/2017  . On home O2 09/25/2017  . Bilateral lower extremity edema 06/27/2017  . Insomnia 03/28/2017  . Uncontrolled type 2 diabetes mellitus with hyperglycemia, with long-term current use of insulin ([place]) 03/07/2017  . CKD (chronic kidney disease), stage III ([place]) 03/07/2017  . Palliative care encounter   . Goals of care, counseling/discussion   . Encounter for hospice care discussion   . AF (paroxysmal atrial fibrillation) ([place]) 03/05/2017  . Chronic diastolic CHF (congestive heart failure) ([place]) 03/04/2017  . Chronic respiratory failure ([place]) 03/04/2017  . Atrial fibrillation with rapid ventricular response ([place])   . Chronic diastolic heart failure ([place])   . Constipation 02/25/2017  . Overflow diarrhea/Constipation 02/25/2017  . Non compliance w medication regimen 12/02/2015  . Hypercholesterolemia 05/12/2014  . Elevated LFTs 05/12/2014  . Foot laceration 05/12/2014  . Dyspnea 12/19/2013  . Acute on chronic respiratory failure with hypoxia ([place]) 11/17/2013  . COPD exacerbation ([place]) 11/15/2013  . Atrial fibrillation ([place]) 11/10/2013  . Elevated PSA 01/20/2013  . Diabetes mellitus type 2, uncontrolled ([place])   . COPD (chronic obstructive pulmonary disease) ([place])   . Hypertension   . Allergy   . Elevated lipids   . Pulmonary fibrosis ([place])   . Bronchitis   .  Colon polyps   . Colon polyps     Past Surgical History:  Procedure Laterality Date  . CATARACT EXTRACTION W/PHACO  06/25/2012   Procedure: CATARACT EXTRACTION PHACO AND INTRAOCULAR LENS PLACEMENT (IOC);  Surgeon: Elta Guadeloupe T. Gershon Crane, MD;  Location: [facility];  Service: Ophthalmology;  Laterality: Left;  CDE=19.01  . CATARACT EXTRACTION W/PHACO  07/09/2012   Procedure: CATARACT EXTRACTION PHACO AND INTRAOCULAR LENS PLACEMENT (IOC);  Surgeon: Elta Guadeloupe T. Gershon Crane, MD;  Location: [facility];  Service: Ophthalmology;  Laterality: Right;  CDE: 20.09        Home Medications    Prior to Admission medications     Medication Sig Start Date End Date Taking? Authorizing Provider  albuterol (PROVENTIL) (2.5 MG/3ML) 0.083% nebulizer solution INHALE 1 VIAL VIA NEBULIZER EVERY 6 HOURS AS NEEDED FOR WHEEZING OR SHORTNESS OF BREATH 07/19/17   Orlena Sheldon, PA-C  apixaban (ELIQUIS) 2.5 MG TABS tablet Take 1 tablet (2.5 mg total) by mouth 2 (two) times daily. 02/05/18   Orlena Sheldon, PA-C  aspirin EC 81 MG tablet Take 81 mg by mouth daily.    [provider]  budesonide (PULMICORT) 0.5 MG/2ML nebulizer solution Take 2 mLs (0.5 mg total) by nebulization 2 (two) times daily. 02/05/18   Dena Billet B, PA-C  cloNIDine (CATAPRES) 0.1 MG tablet TAKE 1 TABLET BY MOUTH 2 TIMES A DAY 01/28/18   Dena Billet B, PA-C  diltiazem (CARDIZEM CD) 240 MG 24 hr capsule TAKE 1 CAPSULE BY MOUTH DAILY 12/03/17   Orlena Sheldon, PA-C  furosemide (LASIX) 40 MG tablet Take 1 tablet (40 mg total) by mouth every other day. Patient not taking: Reported on 02/07/2018 09/26/17 09/26/18  [name], MD  guaiFENesin (MUCINEX) 600 MG 12 hr tablet Take 1 tablet (600 mg total) by mouth 2 (two) times daily. 02/04/18   Barton Dubois, MD  insulin detemir (LEVEMIR) 100 unit/ml SOLN Inject 0.35 mLs (35 Units total) into the skin 2 (two) times daily. 02/04/18   Barton Dubois, MD  insulin lispro (HUMALOG) 100 UNIT/ML KwikPen Junior Inject 0.2 mLs (20 Units total) into the skin 3 (three) times daily. 02/05/18   Dena Billet B, PA-C  ipratropium (ATROVENT) 0.02 % nebulizer solution Take 2.5 mLs (0.5 mg total) by nebulization 4 (four) times daily. 02/05/18   Dena Billet B, PA-C  losartan (COZAAR) 100 MG tablet TAKE 1 TABLET BY MOUTH DAILY 12/31/17   Dena Billet B, PA-C  metoprolol tartrate (LOPRESSOR) 25 MG tablet TAKE 1 TABLET BY MOUTH TWICE DAILY 12/31/17   Orlena Sheldon, PA-C  omeprazole (PRILOSEC) 20 MG capsule Take 1 capsule (20 mg total) by mouth daily. Patient not taking: Reported on 02/06/2018 02/04/18   Barton Dubois, MD  OXYGEN Inhale 3 L into the lungs  daily.     [provider]  potassium chloride SA (K-DUR,KLOR-CON) 20 MEQ tablet Take 1 tablet (20 mEq total) by mouth every other day. Take with lasix. 09/26/17   Johnson, Clanford L, MD  pravastatin (PRAVACHOL) 80 MG tablet Take 1 tablet (80 mg total) by mouth at bedtime. Patient not taking: Reported on 02/07/2018 02/05/18   Dena Billet B, PA-C  predniSONE (DELTASONE) 20 MG tablet Take 2 tablets by mouth daily x2 days; then 1 tablet by mouth daily x3 days; then half a tablet by mouth daily x3 days and stop prednisone. 02/04/18   Barton Dubois, MD    Family History Family History  Problem Relation Age of Onset  . Heart disease  Mother   . CAD Other   . Diabetes Other     Social History Social History   Tobacco Use  . Smoking status: Former Smoker    Packs/day: 1.50    Years: 60.00    Pack years: 90.00    Types: Cigarettes    Last attempt to quit: 12/31/2012    Years since quitting: 5.1  . Smokeless tobacco: Never Used  Substance Use Topics  . Alcohol use: No  . Drug use: No     Allergies   Ace inhibitors   Review of Systems Review of Systems  Unable to perform ROS: Mental status change     Physical Exam Updated Vital Signs BP (!) 121/56   Pulse (!) 54   Temp (!) 95.2 F (35.1 C)   Resp 15   Ht 5\' 5"  (1.651 m)   Wt 86.6 kg (190 lb 14.7 oz)   SpO2 100%   BMI 31.77 kg/m   Physical Exam  Constitutional: He appears well-developed and well-nourished. No distress.  Obese, chronically ill-appearing, pale He is alert he answers questions but mostly says "I do not know"  HENT:  Head: Normocephalic and atraumatic.  Mouth/Throat: Oropharynx is clear and moist. No oropharyngeal exudate.  Eyes: Pupils are equal, round, and reactive to light. Conjunctivae and EOM are normal.  Neck: Normal range of motion. Neck supple.  No meningismus.  Cardiovascular: Normal rate, normal heart sounds and intact distal pulses.  No murmur heard. Bradycardia on monitor    Pulmonary/Chest: Effort normal. No respiratory distress. He has wheezes.  Diffuse inspiratory and expiratory wheezes  Abdominal: Soft. He exhibits distension. There is no tenderness. There is no rebound and no guarding.  Musculoskeletal: Normal range of motion. He exhibits edema. He exhibits no tenderness.  +2 edema to knees  Neurological: He is alert. No cranial nerve deficit. He exhibits normal muscle tone. Coordination normal.  Patient is alert to person only.  He is somnolent and quickly falls back asleep but arouses and answers questions appropriately though he is confused and says "I do not know" to most questions.  cranial nerves II to XII intact, 5/5 strength throughout no focal deficits  Skin: Skin is warm. Capillary refill takes less than 2 seconds. No rash noted.  Psychiatric: He has a normal mood and affect. His behavior is normal.  Nursing note and vitals reviewed.    ED Treatments / Results  Labs (all labs ordered are listed, but only abnormal results are displayed) Labs Reviewed  CBC WITH DIFFERENTIAL/PLATELET - Abnormal; Notable for the following components:      Result Value   RBC 3.70 (*)    Hemoglobin 8.5 (*)    HCT 27.1 (*)    MCV 73.2 (*)    MCH 23.0 (*)    RDW 18.5 (*)    All other components within normal limits  COMPREHENSIVE METABOLIC PANEL - Abnormal; Notable for the following components:   Sodium 133 (*)    Glucose, Bld 116 (*)    Calcium 8.3 (*)    Total Protein 5.7 (*)    Albumin 2.9 (*)    All other components within normal limits  BRAIN NATRIURETIC PEPTIDE - Abnormal; Notable for the following components:   B Natriuretic Peptide 169.0 (*)    All other components within normal limits  ACETAMINOPHEN LEVEL - Abnormal; Notable for the following components:   Acetaminophen (Tylenol), Serum <10 (*)    All other components within normal limits  BLOOD GAS, ARTERIAL -  Abnormal; Notable for the following components:   pH, Arterial 7.330 (*)    pCO2  arterial 50.9 (*)    pO2, Arterial 133 (*)    All other components within normal limits  GLUCOSE, CAPILLARY - Abnormal; Notable for the following components:   Glucose-Capillary 144 (*)    All other components within normal limits  CBG MONITORING, ED - Abnormal; Notable for the following components:   Glucose-Capillary 123 (*)    All other components within normal limits  CBG MONITORING, ED - Abnormal; Notable for the following components:   Glucose-Capillary 101 (*)    All other components within normal limits  CBG MONITORING, ED - Abnormal; Notable for the following components:   Glucose-Capillary 152 (*)    All other components within normal limits  CBG MONITORING, ED - Abnormal; Notable for the following components:   Glucose-Capillary 177 (*)    All other components within normal limits  CULTURE, BLOOD (ROUTINE X 2)  CULTURE, BLOOD (ROUTINE X 2)  URINE CULTURE  TROPONIN I  ETHANOL  SALICYLATE LEVEL  AMMONIA  TSH  URINALYSIS, ROUTINE W REFLEX MICROSCOPIC  T4, FREE  PROCALCITONIN  BASIC METABOLIC PANEL  CORTISOL  STREP PNEUMONIAE URINARY ANTIGEN  CBG MONITORING, ED  I-STAT CG4 LACTIC ACID, ED  CBG MONITORING, ED  CBG MONITORING, ED  TYPE AND SCREEN    EKG EKG Interpretation  Date/Time:  Sunday Feb 17 2018 00:21:04 EDT Ventricular Rate:  61 PR Interval:    QRS Duration: 95 QT Interval:  461 QTC Calculation: 465 R Axis:   62 Text Interpretation:  Sinus bradycardia Confirmed by Ezequiel Essex [phone]) on 02/17/2018 12:28:19 AM   Radiology Ct Head Wo Contrast  Result Date: 02/17/2018 CLINICAL DATA:  Acute onset of altered mental status. Hypoglycemia. EXAM: CT HEAD WITHOUT CONTRAST TECHNIQUE: Contiguous axial images were obtained from the base of the skull through the vertex without intravenous contrast. COMPARISON:  CT of the head performed 09/25/2017, and MRI of the brain performed 09/26/2017 FINDINGS: Brain: No evidence of acute infarction, hemorrhage,  hydrocephalus, extra-axial collection or mass lesion / mass effect. Prominence of the ventricles and sulci reflects mild to moderate cortical volume loss. Mild cerebellar atrophy is noted. Mild periventricular and subcortical white matter change likely reflects small vessel ischemic microangiopathy. The brainstem and fourth ventricle are within normal limits. The basal ganglia are unremarkable in appearance. The cerebral hemispheres demonstrate grossly normal gray-white differentiation. No mass effect or midline shift is seen. Vascular: No hyperdense vessel or unexpected calcification. Skull: There is no evidence of fracture; visualized osseous structures are unremarkable in appearance. Sinuses/Orbits: The orbits are within normal limits. The paranasal sinuses and mastoid air cells are well-aerated. Other: No significant soft tissue abnormalities are seen. IMPRESSION: 1. No acute intracranial pathology seen on CT. 2. Mild to moderate cortical volume loss and scattered small vessel ischemic microangiopathy. Electronically Signed   By: Garald Balding M.D.   On: 02/17/2018 02:08   Dg Chest Portable 1 View  Result Date: 02/17/2018 CLINICAL DATA:  Acute onset of altered mental status and hypoglycemia. EXAM: PORTABLE CHEST 1 VIEW COMPARISON:  Chest radiograph performed 02/01/2018 FINDINGS: The lungs are well-aerated. Medial right basilar airspace opacity is concerning for pneumonia. Mild vascular congestion is noted. There is no evidence of pleural effusion or pneumothorax. The cardiomediastinal silhouette is within normal limits. No acute osseous abnormalities are seen. IMPRESSION: Medial right basilar airspace opacity is concerning for pneumonia. Mild vascular congestion noted. Electronically Signed   By: Jacqulynn Cadet  Chang M.D.   On: 02/17/2018 02:09    Procedures Procedures (including critical care time)  Medications Ordered in ED Medications  ipratropium-albuterol (DUONEB) 0.5-2.5 (3) MG/3ML nebulizer  solution 3 mL (has no administration in time range)  dextrose 50 % solution (has no administration in time range)     Initial Impression / Assessment and Plan / ED Course  I have reviewed the triage vital signs and the nursing notes.  Pertinent labs & imaging results that were available during my care of the patient were reviewed by me and considered in my medical decision making (see chart for details).    Patient with altered mental status last seen normal 2 hours ago.  Was found to have bradycardia and hypoglycemia.   the patient found to be hypothermic, hypoglycemic and bradycardic.  His mental status is stable but previous more confused than his baseline.  He is given IV fluids and placed on a bear hugger.  Chest x-ray does not show obvious pneumonia.  Patient started on broad-spectrum antibiotics after cultures obtained for possible occult sepsis.  Lactate is normal.  Patient has recurrent hypoglycemia and required further dosing of D50 and started on D10 drip.  Did require dose of atropine as well for bradycardia to the 40s.  He is also given glucagon given his history of beta-blocker use.  Heart rate has stabilized in the 50s and 60s after above measures including atropine x2 and glucagon.  Patient remains somewhat confused and somnolent and difficult to stay awake.  ABG however does not show any acute CO2 retention and CT head is nonacute.  Patient given IV fluids and IV antibiotics with concern for occult sepsis causing his hypoglycemia, hypothermia and bradycardia. Thyroid studies are checked and normal. He is maintaining his airway at this time  Admission to ICU discussed with Dr. Myna Hidalgo.   CRITICAL CARE Performed by: Ezequiel Essex Total critical care time:61minutes Critical care time was exclusive of separately billable procedures and treating other patients. Critical care was necessary to treat or prevent imminent or life-threatening deterioration. Critical care was  time spent personally by me on the following activities: development of treatment plan with patient and/or surrogate as well as nursing, discussions with consultants, evaluation of patient's response to treatment, examination of patient, obtaining history from patient or surrogate, ordering and performing treatments and interventions, ordering and review of laboratory studies, ordering and review of radiographic studies, pulse oximetry and re-evaluation of patient's condition.   Final Clinical Impressions(s) / ED Diagnoses   Final diagnoses:  Hypoglycemia  Hypothermia, initial encounter  HCAP (healthcare-associated pneumonia)  Bradycardia    ED Discharge Orders    None       Anayah Arvanitis, Annie Main, MD 02/17/18 [phone]

## 2018-02-18 ENCOUNTER — Ambulatory Visit: Payer: Medicare Other | Admitting: Physician Assistant

## 2018-02-18 DIAGNOSIS — J449 Chronic obstructive pulmonary disease, unspecified: Secondary | ICD-10-CM

## 2018-02-18 LAB — BASIC METABOLIC PANEL
Anion gap: 6 (ref 5–15)
BUN: 25 mg/dL — AB (ref 6–20)
CHLORIDE: 101 mmol/L (ref 101–111)
CO2: 26 mmol/L (ref 22–32)
CREATININE: 1.09 mg/dL (ref 0.61–1.24)
Calcium: 8.4 mg/dL — ABNORMAL LOW (ref 8.9–10.3)
GFR calc Af Amer: >60 mL/min (ref >60)
GFR calc non Af Amer: >60 mL/min (ref >60)
GLUCOSE: 298 mg/dL — AB (ref 65–99)
Potassium: 4.9 mmol/L (ref 3.5–5.1)
SODIUM: 133 mmol/L — AB (ref 135–145)

## 2018-02-18 LAB — GLUCOSE, CAPILLARY
GLUCOSE-CAPILLARY: 361 mg/dL — AB (ref 65–99)
Glucose-Capillary: 278 mg/dL — ABNORMAL HIGH (ref 65–99)
Glucose-Capillary: 392 mg/dL — ABNORMAL HIGH (ref 65–99)
Glucose-Capillary: 185 mg/dL — ABNORMAL HIGH (ref 65–99)
Glucose-Capillary: 206 mg/dL — ABNORMAL HIGH (ref 65–99)

## 2018-02-18 LAB — CBC
HCT: 26.1 % — ABNORMAL LOW (ref 39.0–52.0)
HEMOGLOBIN: 8.2 g/dL — AB (ref 13.0–17.0)
MCH: 23 pg — AB (ref 26.0–34.0)
MCHC: 31.4 g/dL (ref 30.0–36.0)
MCV: 73.3 fL — ABNORMAL LOW (ref 78.0–100.0)
Platelets: 260 10*3/uL (ref 150–400)
RBC: 3.56 MIL/uL — ABNORMAL LOW (ref 4.22–5.81)
RDW: 18.4 % — ABNORMAL HIGH (ref 11.5–15.5)
WBC: 12.3 10*3/uL — ABNORMAL HIGH (ref 4.0–10.5)

## 2018-02-18 MED ORDER — CLONIDINE HCL 0.1 MG PO TABS
0.1000 mg | ORAL_TABLET | Freq: Two times a day (BID) | ORAL | Status: DC
  Administered 2018-02-18 – 2018-02-20 (×4): 0.1 mg via ORAL
  Filled 2018-02-18 (×4): qty 1

## 2018-02-18 MED ORDER — INSULIN DETEMIR 100 UNIT/ML ~~LOC~~ SOLN
30.0000 [IU] | Freq: Every day | SUBCUTANEOUS | Status: DC
  Administered 2018-02-18: 30 [IU] via SUBCUTANEOUS
  Filled 2018-02-18 (×2): qty 0.3

## 2018-02-18 MED ORDER — GUAIFENESIN ER 600 MG PO TB12
600.0000 mg | ORAL_TABLET | Freq: Two times a day (BID) | ORAL | Status: DC
  Administered 2018-02-18 – 2018-02-20 (×4): 600 mg via ORAL
  Filled 2018-02-18 (×4): qty 1

## 2018-02-18 MED ORDER — DILTIAZEM HCL ER COATED BEADS 240 MG PO CP24
240.0000 mg | ORAL_CAPSULE | Freq: Every day | ORAL | Status: DC
  Administered 2018-02-18 – 2018-02-20 (×3): 240 mg via ORAL
  Filled 2018-02-18 (×3): qty 1

## 2018-02-18 MED ORDER — METOPROLOL TARTRATE 25 MG PO TABS
25.0000 mg | ORAL_TABLET | Freq: Two times a day (BID) | ORAL | Status: DC
  Administered 2018-02-18 – 2018-02-20 (×4): 25 mg via ORAL
  Filled 2018-02-18 (×4): qty 1

## 2018-02-18 MED ORDER — INSULIN ASPART 100 UNIT/ML ~~LOC~~ SOLN
0.0000 [IU] | Freq: Every day | SUBCUTANEOUS | Status: DC
  Administered 2018-02-18: 2 [IU] via SUBCUTANEOUS

## 2018-02-18 MED ORDER — INSULIN ASPART 100 UNIT/ML ~~LOC~~ SOLN
0.0000 [IU] | Freq: Three times a day (TID) | SUBCUTANEOUS | Status: DC
  Administered 2018-02-19: 3 [IU] via SUBCUTANEOUS

## 2018-02-18 NOTE — Progress Notes (Signed)
[facility] PROGRESS NOTE  Edward [name] BMW:[id number] DOB: 09-26-1935 DOA: 02/17/2018 PCP: Orlena Sheldon, PA-C   Interim summary and HPI 83 y.o. male with medical history significant for COPD with chronic respiratory failure, insulin-dependent diabetes mellitus, chronic diastolic CHF, hypertension, now presenting to the emergency department with lethargy and confusion.  Patient was reportedly in his usual state last night, but was noted early this morning to be standing in the bathroom, confused and lethargic.  EMS was called and the patient was found to have CBG of 32. He was hypothermic and Heart rate was 45.  He was treated with 50% dextrose and 0.5 mg atropine, as well as albuterol neb prior to arrival in the ED.  Patient was admitted for further evaluation and treatment of hypoglycemia.  Assessment/Plan: 1-hypoglycemic event -Patient symptoms can all be explained by hypoglycemia. -Recent medications were adjusted while he was is still receiving tapering steroids for COPD exacerbation.  Once the steroids were over appears that his insulin therapy was too much (according to the family before the adjustment he was not even compliant with all the medication regimen that he was supposed to). -Patient is no longer hypothermic, stable vital signs, CBGs running in the 300-400 range. -He denies any acute complaints. -After reviewing his records and discussing with the diabetes coordinator will initiate once again long-acting insulin 30 units daily and also use sliding scale insulin -We will follow CBGs, education regarding diet has been provided. -follow clinical response  2-presumed sepsis on admission.  Sepsis has been rule out -Antibiotics will be discontinued -Patient with very low procalcitonin, no complaining of shortness of breath, no fever, normal WBCs on admission and just mild elevation after initial Solu-Medrol was given -will monitor off antibiotics.  3-acute encephalopathy:  Metabolic in nature and associated with hypoglycemia. -Mentation is back to normal -will monitor  4-chronic respiratory failure due to COPD -Good air movement bilaterally, no wheezing and not experiencing any respiratory distress. -We will continue home nebulizer treatment -Continue home oxygen supplementation -Stop steroids, antibiotics and monitor clinical response.  5-chronic atrial fibrillation -CHADsVASC score 5 -will resume rate control agents -continue eliquis   6-chronic diastolic HF -stable and compensated -continue daily weight and strict intake and output  -low sodium diet discussed with patient   7-type 2 diabetes, uncontrolled: with nephropathy  -patient with recent A1C of 13.3 -will follow CBG's and adjust hypoglycemic regimen as needed   8-CKD stage 3 -stable and at baseline -will monitor electrolytes and renal function trend   Code Status: Full code Family Communication: Son and wife at bedside. Disposition Plan: Transfer out of the stepdown, initiate treatment with insulin regimen and follow CBGs, monitor patient off antibiotics.   Consultants:  None  Procedures:  See below for x-ray reports   Antibiotics:  Vanc and cefepime 5/19>>>5/20  HPI/Subjective: Afebrile, no chest pain, no nausea, no vomiting.  Patient denies any acute complaints.  Objective: Vitals:   02/18/18 0825 02/18/18 0827  BP:    Pulse:    Resp:    Temp:    SpO2: 98% 99%    Intake/Output Summary (Last 24 hours) at 02/18/2018 0936 Last data filed at 02/18/2018 0644 Gross per 24 hour  Intake 946 ml  Output 850 ml  Net 96 ml   Filed Weights   02/17/18 0019 02/17/18 0401 02/18/18 0500  Weight: 85.3 kg (188 lb) 86.6 kg (190 lb 14.7 oz) 86.9 kg (191 lb 9.3 oz)    Exam:   General: Afebrile, no  nausea, no vomiting, no chest pain, breathing good and denying any distress.  Cardiovascular: S1-S2, rate controlled, no rubs, no gallops, no JVD.  No crackles,  Respiratory:  Very little wheezing, no tachypnea, good air movement bilaterally, no using accessory muscles.  Abdomen: Soft, nontender, nondistended, positive bowel sounds.  Musculoskeletal: Trace edema bilaterally, no cyanosis, no clubbing  Data Reviewed: Basic Metabolic Panel: Recent Labs  Lab 02/17/18 0045 02/17/18 0421 02/18/18 0431  NA 133* 133* 133*  K 4.3 4.3 4.9  CL 101 99* 101  CO2 26 28 26   GLUCOSE 116* 153* 298*  BUN 20 21* 25*  CREATININE 0.96 1.02 1.09  CALCIUM 8.3* 8.0* 8.4*   Liver Function Tests: Recent Labs  Lab 02/17/18 0045  AST 20  ALT 28  ALKPHOS 61  BILITOT 0.5  PROT 5.7*  ALBUMIN 2.9*    Recent Labs  Lab 02/17/18 0045  AMMONIA 17   CBC: Recent Labs  Lab 02/17/18 0045 02/18/18 0431  WBC 6.2 12.3*  NEUTROABS 4.3  --   HGB 8.5* 8.2*  HCT 27.1* 26.1*  MCV 73.2* 73.3*  PLT 195 260   Cardiac Enzymes: Recent Labs  Lab 02/17/18 0045  TROPONINI <0.03   BNP (last 3 results) Recent Labs    01/30/18 0357 02/01/18 1808 02/17/18 0045  BNP 39.0 202.0* 169.0*    CBG: Recent Labs  Lab 02/17/18 1632 02/17/18 2156 02/17/18 2343 02/18/18 0056 02/18/18 0800  GLUCAP 300* 423* 402* 361* 278*    Recent Results (from the past 240 hour(s))  Blood culture (routine x 2)     Status: None (Preliminary result)   Collection Time: 02/17/18 12:35 AM  Result Value Ref Range Status   Specimen Description BLOOD LEFT ARM  Final   Special Requests   Final    BOTTLES DRAWN AEROBIC AND ANAEROBIC Blood Culture adequate volume   Culture   Final    NO GROWTH 1 DAY Performed at [facility], [address], [place], [place] [address]    Report Status PENDING  Incomplete  Blood culture (routine x 2)     Status: None (Preliminary result)   Collection Time: 02/17/18 12:50 AM  Result Value Ref Range Status   Specimen Description LEFT ANTECUBITAL  Final   Special Requests   Final    BOTTLES DRAWN AEROBIC AND ANAEROBIC Blood Culture adequate volume   Culture    Final    NO GROWTH 1 DAY Performed at [facility], [address], [place], [place] [address]    Report Status PENDING  Incomplete     Studies: Ct Head Wo Contrast  Result Date: 02/17/2018 CLINICAL DATA:  Acute onset of altered mental status. Hypoglycemia. EXAM: CT HEAD WITHOUT CONTRAST TECHNIQUE: Contiguous axial images were obtained from the base of the skull through the vertex without intravenous contrast. COMPARISON:  CT of the head performed 09/25/2017, and MRI of the brain performed 09/26/2017 FINDINGS: Brain: No evidence of acute infarction, hemorrhage, hydrocephalus, extra-axial collection or mass lesion / mass effect. Prominence of the ventricles and sulci reflects mild to moderate cortical volume loss. Mild cerebellar atrophy is noted. Mild periventricular and subcortical white matter change likely reflects small vessel ischemic microangiopathy. The brainstem and fourth ventricle are within normal limits. The basal ganglia are unremarkable in appearance. The cerebral hemispheres demonstrate grossly normal gray-white differentiation. No mass effect or midline shift is seen. Vascular: No hyperdense vessel or unexpected calcification. Skull: There is no evidence of fracture; visualized osseous structures are unremarkable in appearance. Sinuses/Orbits:  The orbits are within normal limits. The paranasal sinuses and mastoid air cells are well-aerated. Other: No significant soft tissue abnormalities are seen. IMPRESSION: 1. No acute intracranial pathology seen on CT. 2. Mild to moderate cortical volume loss and scattered small vessel ischemic microangiopathy. Electronically Signed   By: Garald Balding M.D.   On: 02/17/2018 02:08   Dg Chest Portable 1 View  Result Date: 02/17/2018 CLINICAL DATA:  Acute onset of altered mental status and hypoglycemia. EXAM: PORTABLE CHEST 1 VIEW COMPARISON:  Chest radiograph performed 02/01/2018 FINDINGS: The lungs are well-aerated. Medial right basilar airspace  opacity is concerning for pneumonia. Mild vascular congestion is noted. There is no evidence of pleural effusion or pneumothorax. The cardiomediastinal silhouette is within normal limits. No acute osseous abnormalities are seen. IMPRESSION: Medial right basilar airspace opacity is concerning for pneumonia. Mild vascular congestion noted. Electronically Signed   By: Garald Balding M.D.   On: 02/17/2018 02:09    Scheduled Meds: . apixaban  2.5 mg Oral BID  . aspirin EC  81 mg Oral Daily  . budesonide  0.5 mg Nebulization BID  . insulin aspart  0-9 Units Subcutaneous TID WC  . insulin detemir  30 Units Subcutaneous Daily  . ipratropium  0.5 mg Nebulization TID  . mouth rinse  15 mL Mouth Rinse BID  . pantoprazole  40 mg Oral Daily  . pravastatin  80 mg Oral q1800  . sodium chloride flush  3 mL Intravenous Q12H  . sodium chloride flush  3 mL Intravenous Q12H   Continuous Infusions: . sodium chloride     Time spent: 30 minutes  [place] [facility] Pager [phone]. If 7PM-7AM, please contact night-coverage at [email or web address], password [facility] 02/18/2018, 9:36 AM  LOS: 1 day

## 2018-02-18 NOTE — Progress Notes (Signed)
Pt educated to use urinal so RN could keep up with urine output d/t foley catheter being removed earlier in the shift. Pt verbalized understanding. Will continue to monitor pt

## 2018-02-18 NOTE — Progress Notes (Addendum)
Inpatient Diabetes Program Recommendations  AACE/ADA: New Consensus Statement on Inpatient Glycemic Control (2015)  Target Ranges:  Prepandial:   less than 140 mg/dL      Peak postprandial:   less than 180 mg/dL (1-2 hours)      Critically ill patients:  140 - 180 mg/dL  Results for Edward [name], Edward [name] (MRN [id number]) as of 02/18/2018 07:55  Ref. Range 02/18/2018 04:31  Glucose Latest Ref Range: 65 - 99 mg/dL 298 (H)   Results for Edward [name], Edward [name] (MRN [id number]) as of 02/18/2018 07:55  Ref. Range 02/17/2018 04:30 02/17/2018 08:05 02/17/2018 11:38 02/17/2018 16:32 02/17/2018 21:56 02/17/2018 23:43 02/18/2018 00:56  Glucose-Capillary Latest Ref Range: 65 - 99 mg/dL 144 (H) 115 (H) 191 (H) 300 (H) 423 (H) 402 (H) 361 (H)   Review of Glycemic Control  Diabetes history: DM2 Outpatient Diabetes medications: Levemir 35 units BID (was taking 40 units BID), Humalog 20 units TID with meals Current orders for Inpatient glycemic control: Novolog 0-9 units TID with meals  Inpatient Diabetes Program Recommendations: Insulin - Basal: Please consider ordering Levemir 35 units daily to start now (based on 86.9 kg x 0.4 units). Correction (SSI): Please consider ordering Novolog 0-5 units QHS for bedtime correction.  Addendum 02/18/18@12 :00- Spoke with patient regarding admission with hypoglycemia. Patient states that he was taking Levemir 40 units BID and Humalog 20 units TID with meals. Patient reports that he was taking the amount of insulin he was discharged on initially but he states that he was told to increase the Levemir from 35 units BID to 40 units BID. Asked patient who told him to increase it and he replied that doctor and nurse.  In reviewing chart, noted patient came to ED on 02/06/18 with hyperglycemia and was instructed to increase Levemir from 35 to 40 units BID.  Patient states that his glucose was running lower with the increased Levemir dose and he frequently woke up with glucose values less than 100 mg/dl  (few times glucose was in the 60's mg/dl).  Patient states that he took the same amount of insulin as he was instructed to take even when glucose was in the 60's mg/dl. Discussed that he was discharged from the hospital on 02/04/18 on Prednisone taper and explained that usually glucose is more elevated with steroids and once steroids are stopped, glucose improves. Explained that if Levemir was increased when he was on steroids, it likely needed to be decreased once steroids were completed. Discussed danger of hypoglycemia along with proper treatment. Asked patient to contact his PCP if he is having glucose values less than 70 mg/dl in case he needs insulin adjustments.  Patient reports that he had a follow up visit scheduled with PCP for today but his son cancelled it since he is in the hospital. Asked patient to call and make another appointment with PCP and to keep a log of glucose readings and insulin taken. Asked that he take the log with him to follow up visits. Asked patient to be sure he has a clear understanding of prescribed insulin dosages and frequency at time of discharge.  Patient verbalized understanding and states that he has no questions or concerns at this time related to DM.  Thanks, Edward Alderman, RN, MSN, CDE Diabetes Coordinator Inpatient Diabetes Program [phone] (Team Pager from 8am to 5pm)

## 2018-02-18 NOTE — Care Management Note (Signed)
Case Management Note  Patient Details  Name: Edward [name] MRN: [id number] Date of Birth: 29-Mar-1935  Subjective/Objective: Adm with hypoglycemia.  Pt is from home, lives with family. He is ind with ADL's. Uses a RW and cane as needed. He has home oxygen and neb machine. He is active with [facility]. This is his 4th admission is six month. He declines [facility] referral at this time. States he told someone to get out of his house last visit.                    Action/Plan: DC home with resumption of home health. Will need HH order.    Expected Discharge Date:    02/19/2018             Expected Discharge Plan:  [name]  In-House Referral:     Discharge planning Services  CM Consult  Post Acute Care Choice:  Home Health, Resumption of Svcs/PTA Provider Choice offered to:  Patient  DME Arranged:    DME Agency:     HH Arranged:    [place] Agency:  [place]  Status of Service:  Completed, signed off  If discussed at [place] of Stay Meetings, dates discussed:    Additional Comments:  Jessina Marse, Chauncey Reading, RN 02/18/2018, 2:33 PM

## 2018-02-19 LAB — GLUCOSE, CAPILLARY
GLUCOSE-CAPILLARY: 49 mg/dL — AB (ref 65–99)
GLUCOSE-CAPILLARY: 101 mg/dL — AB (ref 65–99)
GLUCOSE-CAPILLARY: 81 mg/dL (ref 65–99)
GLUCOSE-CAPILLARY: 199 mg/dL — AB (ref 65–99)
Glucose-Capillary: 202 mg/dL — ABNORMAL HIGH (ref 65–99)
Glucose-Capillary: 60 mg/dL — ABNORMAL LOW (ref 65–99)
Glucose-Capillary: 156 mg/dL — ABNORMAL HIGH (ref 65–99)

## 2018-02-19 LAB — BASIC METABOLIC PANEL
ANION GAP: 3 — AB (ref 5–15)
BUN: 26 mg/dL — ABNORMAL HIGH (ref 6–20)
CHLORIDE: 102 mmol/L (ref 101–111)
CO2: 30 mmol/L (ref 22–32)
CREATININE: 1.16 mg/dL (ref 0.61–1.24)
Calcium: 8.3 mg/dL — ABNORMAL LOW (ref 8.9–10.3)
GFR calc non Af Amer: 58 mL/min — ABNORMAL LOW (ref >60)
Glucose, Bld: 57 mg/dL — ABNORMAL LOW (ref 65–99)
Potassium: 4.3 mmol/L (ref 3.5–5.1)
SODIUM: 135 mmol/L (ref 135–145)

## 2018-02-19 LAB — CBC
HCT: 26.8 % — ABNORMAL LOW (ref 39.0–52.0)
HEMOGLOBIN: 8.1 g/dL — AB (ref 13.0–17.0)
MCH: 22.5 pg — ABNORMAL LOW (ref 26.0–34.0)
MCHC: 30.2 g/dL (ref 30.0–36.0)
MCV: 74.4 fL — ABNORMAL LOW (ref 78.0–100.0)
Platelets: 270 10*3/uL (ref 150–400)
RBC: 3.6 MIL/uL — AB (ref 4.22–5.81)
RDW: 18.4 % — ABNORMAL HIGH (ref 11.5–15.5)
WBC: 7.4 10*3/uL (ref 4.0–10.5)

## 2018-02-19 MED ORDER — INSULIN ASPART 100 UNIT/ML ~~LOC~~ SOLN
0.0000 [IU] | Freq: Three times a day (TID) | SUBCUTANEOUS | Status: DC
  Administered 2018-02-20: 3 [IU] via SUBCUTANEOUS

## 2018-02-19 MED ORDER — INSULIN DETEMIR 100 UNIT/ML ~~LOC~~ SOLN
25.0000 [IU] | Freq: Every day | SUBCUTANEOUS | Status: DC
  Administered 2018-02-19: 25 [IU] via SUBCUTANEOUS
  Filled 2018-02-19 (×2): qty 0.25

## 2018-02-19 NOTE — Progress Notes (Addendum)
Hypoglycemic Event  CBG: 49 Treatment: 15 grams carbohydrates given to pt  Symptoms: n/a- pt asymptomatic  Follow-up CBG: [id number] CBG Result: 81  Possible Reasons for Event: Lantus dose  Comments/MD notified: n/a- pt was admitted with low blood sugar    Tim Lair

## 2018-02-19 NOTE — Progress Notes (Signed)
[facility] PROGRESS NOTE  Edward [name] IFO:[id number] DOB: 01/13/1935 DOA: 02/17/2018 PCP: Orlena Sheldon, PA-C   Interim summary and HPI 83 y.o. male with medical history significant for COPD with chronic respiratory failure, insulin-dependent diabetes mellitus, chronic diastolic CHF, hypertension, now presenting to the emergency department with lethargy and confusion.  Patient was reportedly in his usual state last night, but was noted early this morning to be standing in the bathroom, confused and lethargic.  EMS was called and the patient was found to have CBG of 32. He was hypothermic and Heart rate was 45.  He was treated with 50% dextrose and 0.5 mg atropine, as well as albuterol neb prior to arrival in the ED.  Patient was admitted for further evaluation and treatment of hypoglycemia.  Assessment/Plan: 1-hypoglycemic event -Patient symptoms can all be explained by hypoglycemia. -Recent medications were adjusted while he was is still receiving tapering steroids for COPD exacerbation.  Once the steroids were over appears that his insulin therapy was too much (according to the family before the adjustment he was not even compliant with all the medication regimen that he was supposed to). -Patient is no longer hypothermic, stable vital signs, CBGs running in the 300-400 range. -He denies any acute complaints. -Patient experience episode of hypoglycemia overnight -Not acting has been once again adjusted and he is a sliding scale insulin modified. -Instructions given to nursing staff to provide a snack at bedtime and if sugar less than 150.   -Continue to follow CBGs and further adjust hypoglycemic regimen prior to discharge.  2-presumed sepsis on admission.  Sepsis has been rule out -Antibiotics will be discontinued -Patient with very low procalcitonin, no complaining of shortness of breath, no fever, normal WBCs on admission and just mild elevation after initial Solu-Medrol was  given -Patient has continued doing great without any signs of infection while monitoring off antibiotics. -Doubt that he had HCAP  3-acute encephalopathy: Metabolic in nature and associated with hypoglycemia. -Mentation is back to normal and has remained stable -will monitor  4-chronic respiratory failure due to COPD: with hypoxia  -Good air movement bilaterally, no wheezing and not experiencing any respiratory distress. -Will continue home nebulizer treatment -Continue home oxygen supplementation -no need for steroids identified   5-chronic atrial fibrillation: paroxysmal  -CHADsVASC score 5 -continue cardizem and metoprolol  -continue eliquis  -rate stable and currently sinus -telemetry discontinued  6-chronic diastolic HF -stable and compensated -continue daily weight and strict intake and output  -low sodium diet discussed with patient and family  7-type 2 diabetes, uncontrolled: with nephropathy  -patient with recent A1C of 13.3 -will follow CBG's and continue adjusting hypoglycemic regimen as needed  -plan is to have CBG's in 200-300 range (as much as possible, without experiencing low CBG's)  8-CKD stage 3 -stable and at baseline -will monitor electrolytes and renal function trend intermittently   Code Status: Full code Family Communication: Son and wife at bedside. Disposition Plan: Patient experienced low sugar overnight, will continue adjusting hypoglycemic regimen prior to discharge.  Increase physical activity, encourage proper p.o. intake without skipping meals.  Nursing staff instructed to provide snack if sugar less than 150 at bedtime.  Appreciate assistance from diabetes coordinator.   Consultants:  None  Procedures:  See below for x-ray reports   Antibiotics:  Vanc and cefepime 5/19>>>5/20  HPI/Subjective: No fever, no chest pain, no nausea, no vomiting.  Patient denies any worsening in his breathing and is otherwise feeling okay.  He  experienced  low sugar overnight.  Objective: Vitals:   02/19/18 1344 02/19/18 1431  BP:  (!) 129/52  Pulse:  (!) 51  Resp:  20  Temp:  98.5 F (36.9 C)  SpO2: 98% 100%    Intake/Output Summary (Last 24 hours) at 02/19/2018 1802 Last data filed at 02/19/2018 0451 Gross per 24 hour  Intake 440 ml  Output 700 ml  Net -260 ml   Filed Weights   02/17/18 0401 02/18/18 0500 02/19/18 0500  Weight: 86.6 kg (190 lb 14.7 oz) 86.9 kg (191 lb 9.3 oz) 85.4 kg (188 lb 4.4 oz)    Exam:   General: In no acute distress, no febrile, no nausea, no vomiting, no chest pain.  Breathing at baseline with good oxygen saturation on chronic oxygen supplementation.    Cardiovascular: S1 and S2, rate controlled, currently sinus, no rubs, no gallops, no JVD.  Respiratory: Currently no wheezing, no tachypnea or using accessory muscles on exam, no crackles, good air movement bilaterally.  Positive for scattered rhonchi.  Abdomen: Soft, nontender, nondistended, positive bowel sounds.  Musculoskeletal: Trace edema bilaterally, no cyanosis, no clubbing.  Data Reviewed: Basic Metabolic Panel: Recent Labs  Lab 02/17/18 0045 02/17/18 0421 02/18/18 0431 02/19/18 0414  NA 133* 133* 133* 135  K 4.3 4.3 4.9 4.3  CL 101 99* 101 102  CO2 26 28 26 30   GLUCOSE 116* 153* 298* 57*  BUN 20 21* 25* 26*  CREATININE 0.96 1.02 1.09 1.16  CALCIUM 8.3* 8.0* 8.4* 8.3*   Liver Function Tests: Recent Labs  Lab 02/17/18 0045  AST 20  ALT 28  ALKPHOS 61  BILITOT 0.5  PROT 5.7*  ALBUMIN 2.9*    Recent Labs  Lab 02/17/18 0045  AMMONIA 17   CBC: Recent Labs  Lab 02/17/18 0045 02/18/18 0431 02/19/18 0414  WBC 6.2 12.3* 7.4  NEUTROABS 4.3  --   --   HGB 8.5* 8.2* 8.1*  HCT 27.1* 26.1* 26.8*  MCV 73.2* 73.3* 74.4*  PLT 195 260 270   Cardiac Enzymes: Recent Labs  Lab 02/17/18 0045  TROPONINI <0.03   BNP (last 3 results) Recent Labs    01/30/18 0357 02/01/18 1808 02/17/18 0045  BNP 39.0  202.0* 169.0*    CBG: Recent Labs  Lab 02/19/18 0651 02/19/18 0728 02/19/18 1110 02/19/18 1134 02/19/18 1640  GLUCAP 81 101* 202* 199* 60*    Recent Results (from the past 240 hour(s))  Blood culture (routine x 2)     Status: None (Preliminary result)   Collection Time: 02/17/18 12:35 AM  Result Value Ref Range Status   Specimen Description BLOOD LEFT ARM  Final   Special Requests   Final    BOTTLES DRAWN AEROBIC AND ANAEROBIC Blood Culture adequate volume   Culture   Final    NO GROWTH 2 DAYS Performed at [facility], [address], [place], [place] [address]    Report Status PENDING  Incomplete  Blood culture (routine x 2)     Status: None (Preliminary result)   Collection Time: 02/17/18 12:50 AM  Result Value Ref Range Status   Specimen Description LEFT ANTECUBITAL  Final   Special Requests   Final    BOTTLES DRAWN AEROBIC AND ANAEROBIC Blood Culture adequate volume   Culture   Final    NO GROWTH 2 DAYS Performed at [facility], [address] [place], [place] [address]    Report Status PENDING  Incomplete     Studies: No results found.  Scheduled  Meds: . apixaban  2.5 mg Oral BID  . aspirin EC  81 mg Oral Daily  . budesonide  0.5 mg Nebulization BID  . cloNIDine  0.1 mg Oral BID  . diltiazem  240 mg Oral Daily  . guaiFENesin  600 mg Oral BID  . [START ON 02/20/2018] insulin aspart  0-9 Units Subcutaneous TID WC  . insulin detemir  25 Units Subcutaneous Daily  . ipratropium  0.5 mg Nebulization TID  . mouth rinse  15 mL Mouth Rinse BID  . metoprolol tartrate  25 mg Oral BID  . pantoprazole  40 mg Oral Daily  . pravastatin  80 mg Oral q1800   Continuous Infusions:  Time spent: 30 minutes  [place] [facility] Pager [phone]. If 7PM-7AM, please contact night-coverage at [email or web address], password [facility] 02/19/2018, 6:02 PM  LOS: 2 days

## 2018-02-19 NOTE — Consult Note (Signed)
   [facility] CM Inpatient Consult   02/19/2018  Edward [name] 05-Sep-1935 [id number]  Chart review revealed patient eligible for [place] Management services and post hospital discharge follow up related to a diagnosis of COPD, HF, DM and 4 admits in the last 32month. Patient was evaluated for community based chronic disease management services with [facility] as a benefit of patient's [employer] Met with the patient at the bedside to explain [place] services. At first patient declined services stating he had plenty of nurses coming out to his home and calling him. This liaison explained [place] pharmacy and social work services available. Patient [place] interested in [place] stating "I need help affording my insulins and inhalers."   Patient endorses his primary care provider to be MDena Billet PA.  Verbal consent recieved. Patient unable to remember his telephone number but stated the one in the chart is correct. Patient will receive post hospital discharge calls by [facility]. [place]Management services does not interfere with or replace any services arranged by the inpatient care management team. RNCM left contact information and [facility] literature at the bedside. Made inpatient RNCM aware  [facility] will be following for care management. For additional questions please contact:   Kalei Mckillop RN, BLewisville HospitalLiaison  ([phone] Business Mobile [phone] Toll free office

## 2018-02-19 NOTE — Progress Notes (Signed)
Inpatient Diabetes Program Recommendations  AACE/ADA: New Consensus Statement on Inpatient Glycemic Control (2019)  Target Ranges:  Prepandial:   less than 140 mg/dL      Peak postprandial:   less than 180 mg/dL (1-2 hours)      Critically ill patients:  140 - 180 mg/dL   Results for Edward [name], Edward [name] (MRN [id number]) as of 02/19/2018 07:48  Ref. Range 02/18/2018 08:00 02/18/2018 11:34 02/18/2018 16:15 02/18/2018 21:37 02/19/2018 06:26 02/19/2018 06:51 02/19/2018 07:28  Glucose-Capillary Latest Ref Range: 65 - 99 mg/dL 278 (H) 392 (H) 185 (H) 206 (H) 49 (L) 81 101 (H)   Review of Glycemic Control  Diabetes history: DM2 Outpatient Diabetes medications: Levemir 35 units BID (was taking 40 units BID), Humalog 20 units TID with meals Current orders for Inpatient glycemic control:Levemir 30 units daily,  Novolog 0-15 units TID with meals, Novolog 0-5 units QHS  Inpatient Diabetes Program Recommendations: Insulin - Basal: Please consider decreasing Levemir to 24 units daily.  Thanks, Barnie Alderman, RN, MSN, CDE Diabetes Coordinator Inpatient Diabetes Program [phone] (Team Pager from 8am to 5pm)

## 2018-02-20 LAB — HEMOGLOBIN A1C
Hgb A1c MFr Bld: 11.8 % — ABNORMAL HIGH (ref 4.8–5.6)
Mean Plasma Glucose: 291.96 mg/dL

## 2018-02-20 LAB — GLUCOSE, CAPILLARY
GLUCOSE-CAPILLARY: 71 mg/dL (ref 65–99)
GLUCOSE-CAPILLARY: 223 mg/dL — AB (ref 65–99)

## 2018-02-20 MED ORDER — INSULIN DETEMIR 100 UNIT/ML FLEXPEN: 20.0000 [IU] | Freq: Two times a day (BID) | SUBCUTANEOUS | 3 refills | Status: DC

## 2018-02-20 MED ORDER — FUROSEMIDE 40 MG PO TABS
40.0000 mg | ORAL_TABLET | ORAL | Status: DC
  Administered 2018-02-20: 40 mg via ORAL
  Filled 2018-02-20: qty 1

## 2018-02-20 MED ORDER — INSULIN DETEMIR 100 UNIT/ML ~~LOC~~ SOLN
20.0000 [IU] | Freq: Every day | SUBCUTANEOUS | Status: DC
  Administered 2018-02-20: 20 [IU] via SUBCUTANEOUS
  Filled 2018-02-20 (×2): qty 0.2

## 2018-02-20 MED ORDER — HUMALOG JUNIOR KWIKPEN 100 UNIT/ML ~~LOC~~ SOPN: 8.0000 [IU] | PEN_INJECTOR | Freq: Three times a day (TID) | SUBCUTANEOUS | 3 refills | Status: DC

## 2018-02-20 NOTE — Care Management Important Message (Signed)
Important Message  Patient Details  Name: Edward [name] MRN: [id number] Date of Birth: [birth year]   Medicare Important Message Given:  Yes    [name] 02/20/2018, 3:02 PM

## 2018-02-20 NOTE — Progress Notes (Signed)
Inpatient Diabetes Program Recommendations  AACE/ADA: New Consensus Statement on Inpatient Glycemic Control (2019)  Target Ranges:  Prepandial:   less than 140 mg/dL      Peak postprandial:   less than 180 mg/dL (1-2 hours)      Critically ill patients:  140 - 180 mg/dL   Results for AXTON, CIHLAR (MRN [id number]) as of 02/20/2018 08:11  Ref. Range 02/19/2018 06:51 02/19/2018 07:28 02/19/2018 11:10 02/19/2018 11:34 02/19/2018 16:40 02/19/2018 21:20 02/20/2018 07:48  Glucose-Capillary Latest Ref Range: 65 - 99 mg/dL 81 101 (H) 202 (H) 199 (H) 60 (L) 156 (H) 71    Review of Glycemic Control  Diabetes history: DM2 Outpatient Diabetes medications: Levemir 35 units BID (was taking 40 units BID), Humalog 20 units TID with meals Current orders for Inpatient glycemic control: Levemir 25 units daily,  Novolog 0-9 units TID with meals  Inpatient Diabetes Program Recommendations: Insulin - Basal: Please consider decreasing Levemir to 22 units daily.  Thanks, Barnie Alderman, RN, MSN, CDE Diabetes Coordinator Inpatient Diabetes Program [phone] (Team Pager from 8am to 5pm)

## 2018-02-20 NOTE — Discharge Summary (Signed)
Physician Discharge Summary  Edward [name] GUR:[id number] DOB: 1935/02/19 DOA: 02/17/2018  PCP: Orlena Sheldon, PA-C  Admit date: 02/17/2018 Discharge date: 02/20/2018  Admitted From: Home Disposition: Home  Recommendations for Outpatient Follow-up:  1. Follow up with PCP in 1-2 weeks 2. Please obtain BMP/CBC in one week 3. Please follow up on the following pending results: Hemoglobin A1c 4. Please keep a log of your blood sugars at home 4 times a day and give them to your PCP.  Home Health: Yes resume home health PT Equipment/Devices: Chronic 2 L nasal cannula  Discharge Condition: Stable CODE STATUS: Full Diet recommendation: Heart Healthy / Carb Modified   Brief/Interim Summary:  #) Sepsis rule out: Patient was admitted with hypothermia, hypoglycemia, bradycardia and was initially thought to have sepsis due to pneumonia.  His procalcitonin was negative and antibiotics were withheld.  It was felt that the x-ray findings are more consistent with mild heart failure and that his symptoms were related to his significant hypoglycemia in the setting of insulin use.  See below for hypoglycemia plan.  #) Hypoglycemia in the setting of type 2 diabetes: It was felt that this was the most likely cause of patient's hypothermia, bradycardia, altered mental status.  It was felt to be most likely secondary to excessive insulin use.  His insulin here was decreased to 20 units of Levemir twice a day and his mealtime insulin was discontinued entirely.  He was told to check his blood sugars and given a sliding scale here.  He was discharged home only on Levemir 20 units twice daily and short acting insulin 8 units with meals to follow-up as an outpatient to further uptitrate and recheck his blood sugars frequently before meals see if he needed further up titration of his insulin.  #) Paroxysmal atrial fibrillation: Patient was continued on apixaban 2.5 mg twice daily, diltiazem 240 mg daily, metoprolol  tartrate 25 mg twice daily.  #) Chronic respiratory failure due to COPD on 2 L nasal cannula: Patient was continued on chronic micro-dilators, inhaled corticosteroids.  #) chronic diastolic heart failure: Patient was told to restart furosemide 40 mg every other day as he was noted to have pulmonary edema chest x-ray.  He was told to weigh himself daily and to limit his salt and fluid intake.  He was told to take an extra dose of furosemide if his weight went up by more than 5 pounds we noted worsening swelling of his legs.  #) Hypertension/hyperlipidemia: Patient was continued on diltiazem, beta-blocker, pravastatin.  Discharge Diagnoses:  Principal Problem:   Sepsis due to pneumonia [facility]) Active Problems:   Diabetes mellitus type 2, uncontrolled ([place])   COPD (chronic obstructive pulmonary disease) ([name])   Chronic diastolic CHF (congestive heart failure) ([name])   HCAP (healthcare-associated pneumonia)   Chronic respiratory failure ([name])   AF (paroxysmal atrial fibrillation) ([name])   CKD (chronic kidney disease), stage III ([name])   Acute encephalopathy   Microcytic anemia   Bradycardia    Discharge Instructions  Discharge Instructions    AMB Referral to [place] Management   Complete by:  As directed    Please assign patient for [facility] Pharmacist to engage for patient's expressed need for medication cost assistance with inhalers and insulins. For questions please contact:   Janci Minor RN, [place] [facility] Liaison [phone])   Reason for consult:  Post hospital discharge follow up with [facility] Pharmacist only at this time   Diagnoses of:   Heart Failure COPD/  Pneumonia Diabetes     Expected date of contact:  1-3 days (reserved for hospital discharges)   Call MD for:  difficulty breathing, headache or visual disturbances   Complete by:  As directed    Call MD for:  hives   Complete by:  As directed    Call MD for:  persistant nausea and vomiting   Complete by:  As directed     Call MD for:  redness, tenderness, or signs of infection (pain, swelling, redness, odor or green/yellow discharge around incision site)   Complete by:  As directed    Call MD for:  severe uncontrolled pain   Complete by:  As directed    Call MD for:  temperature >100.4   Complete by:  As directed    Diet - low sodium heart healthy   Complete by:  As directed    Discharge instructions   Complete by:  As directed    Please follow-up with your PCP in 1 to 2 weeks.  Please keep a close eye on your blood sugars and call your PCP with your blood sugar values if they go over 200 to adjust her insulin.   Increase activity slowly   Complete by:  As directed      Allergies as of 02/20/2018      Reactions   Ace Inhibitors Other (See Comments)   Hyperkalemia--07/23/2013:patient states not familiar with the following allergy      Medication List    TAKE these medications   albuterol (2.5 MG/3ML) 0.083% nebulizer solution Commonly known as:  PROVENTIL INHALE 1 VIAL VIA NEBULIZER EVERY 6 HOURS AS NEEDED FOR WHEEZING OR SHORTNESS OF BREATH   apixaban 2.5 MG Tabs tablet Commonly known as:  ELIQUIS Take 1 tablet (2.5 mg total) by mouth 2 (two) times daily.   aspirin EC 81 MG tablet Take 81 mg by mouth daily.   budesonide 0.5 MG/2ML nebulizer solution Commonly known as:  PULMICORT Take 2 mLs (0.5 mg total) by nebulization 2 (two) times daily.   cloNIDine 0.1 MG tablet Commonly known as:  CATAPRES TAKE 1 TABLET BY MOUTH 2 TIMES A DAY   diltiazem 240 MG 24 hr capsule Commonly known as:  CARDIZEM CD TAKE 1 CAPSULE BY MOUTH DAILY   furosemide 40 MG tablet Commonly known as:  LASIX Take 1 tablet (40 mg total) by mouth every other day.   guaiFENesin 600 MG 12 hr tablet Commonly known as:  MUCINEX Take 1 tablet (600 mg total) by mouth 2 (two) times daily.   insulin detemir 100 unit/ml Soln Commonly known as:  LEVEMIR Inject 0.2 mLs (20 Units total) into the skin 2 (two) times  daily. What changed:  how much to take   insulin lispro 100 UNIT/ML KwikPen Junior Inject 0.08 mLs (8 Units total) into the skin 3 (three) times daily. What changed:  how much to take   ipratropium 0.02 % nebulizer solution Commonly known as:  ATROVENT Take 2.5 mLs (0.5 mg total) by nebulization 4 (four) times daily.   losartan 100 MG tablet Commonly known as:  COZAAR TAKE 1 TABLET BY MOUTH DAILY   metoprolol tartrate 25 MG tablet Commonly known as:  LOPRESSOR TAKE 1 TABLET BY MOUTH TWICE DAILY   omeprazole 20 MG capsule Commonly known as:  PRILOSEC Take 1 capsule (20 mg total) by mouth daily.   OXYGEN Inhale 3 L into the lungs daily.   potassium chloride SA 20 MEQ tablet Commonly known as:  K-DUR,KLOR-CON  Take 1 tablet (20 mEq total) by mouth every other day. Take with lasix.   pravastatin 80 MG tablet Commonly known as:  PRAVACHOL Take 1 tablet (80 mg total) by mouth at bedtime.       Allergies  Allergen Reactions  . Ace Inhibitors Other (See Comments)    Hyperkalemia--07/23/2013:patient states not familiar with the following allergy    Consultations:  None   Procedures/Studies: Dg Chest 2 View  Result Date: 02/01/2018 CLINICAL DATA:  Shortness of breath, nonproductive cough, wheezing EXAM: CHEST - 2 VIEW COMPARISON:  01/30/2018 FINDINGS: Mild patchy opacities in the right mid lung and left lower lobe, suspicious for pneumonia. No pleural effusion or pneumothorax. The heart is normal in size. Visualized osseous structures are within normal limits. IMPRESSION: Mild patchy opacities in the right mid lung and left lower lobe, suspicious for pneumonia. Electronically Signed   By: Julian Hy M.D.   On: 02/01/2018 19:24   Ct Head Wo Contrast  Result Date: 02/17/2018 CLINICAL DATA:  Acute onset of altered mental status. Hypoglycemia. EXAM: CT HEAD WITHOUT CONTRAST TECHNIQUE: Contiguous axial images were obtained from the base of the skull through the vertex  without intravenous contrast. COMPARISON:  CT of the head performed 09/25/2017, and MRI of the brain performed 09/26/2017 FINDINGS: Brain: No evidence of acute infarction, hemorrhage, hydrocephalus, extra-axial collection or mass lesion / mass effect. Prominence of the ventricles and sulci reflects mild to moderate cortical volume loss. Mild cerebellar atrophy is noted. Mild periventricular and subcortical white matter change likely reflects small vessel ischemic microangiopathy. The brainstem and fourth ventricle are within normal limits. The basal ganglia are unremarkable in appearance. The cerebral hemispheres demonstrate grossly normal gray-white differentiation. No mass effect or midline shift is seen. Vascular: No hyperdense vessel or unexpected calcification. Skull: There is no evidence of fracture; visualized osseous structures are unremarkable in appearance. Sinuses/Orbits: The orbits are within normal limits. The paranasal sinuses and mastoid air cells are well-aerated. Other: No significant soft tissue abnormalities are seen. IMPRESSION: 1. No acute intracranial pathology seen on CT. 2. Mild to moderate cortical volume loss and scattered small vessel ischemic microangiopathy. Electronically Signed   By: Garald Balding M.D.   On: 02/17/2018 02:08   Dg Chest Portable 1 View  Result Date: 02/17/2018 CLINICAL DATA:  Acute onset of altered mental status and hypoglycemia. EXAM: PORTABLE CHEST 1 VIEW COMPARISON:  Chest radiograph performed 02/01/2018 FINDINGS: The lungs are well-aerated. Medial right basilar airspace opacity is concerning for pneumonia. Mild vascular congestion is noted. There is no evidence of pleural effusion or pneumothorax. The cardiomediastinal silhouette is within normal limits. No acute osseous abnormalities are seen. IMPRESSION: Medial right basilar airspace opacity is concerning for pneumonia. Mild vascular congestion noted. Electronically Signed   By: Garald Balding M.D.   On:  02/17/2018 02:09   Dg Chest Portable 1 View  Result Date: 01/30/2018 CLINICAL DATA:  Increasing shortness of breath EXAM: PORTABLE CHEST 1 VIEW COMPARISON:  09/25/2017 FINDINGS: Mild hyperinflation. Mild diffuse increased interstitial opacity. Stable cardiomediastinal silhouette. No pneumothorax. Subsegmental atelectasis in the right perihilar region. IMPRESSION: Mild diffuse increased interstitial opacity, suspect acute interstitial inflammation or minimal edema on chronic interstitial changes. Electronically Signed   By: Donavan Foil M.D.   On: 01/30/2018 03:49      Subjective:   Discharge Exam: Vitals:   02/20/18 0824 02/20/18 0825  BP:    Pulse:    Resp:    Temp:    SpO2: 98% 100%  Vitals:   02/19/18 2120 02/20/18 0418 02/20/18 0824 02/20/18 0825  BP: (!) 148/64 127/77    Pulse: 75 69    Resp: 18 19    Temp: 99 F (37.2 C) 99.1 F (37.3 C)    TempSrc:      SpO2: 100% 100% 98% 100%  Weight:  84.7 kg (186 lb 12.8 oz)    Height:        General: Pt is alert, awake, not in acute distress Cardiovascular: Distant heart sounds, regular rate and rhythm Respiratory: C diminished lung sounds at bases, scattered crackles and rhonchi, intermittent wheezing Abdominal: Soft, NT, ND, bowel sounds + Extremities: 1+ lower extremity edema    The results of significant diagnostics from this hospitalization (including imaging, microbiology, ancillary and laboratory) are listed below for reference.     Microbiology: Recent Results (from the past 240 hour(s))  Blood culture (routine x 2)     Status: None (Preliminary result)   Collection Time: 02/17/18 12:35 AM  Result Value Ref Range Status   Specimen Description BLOOD LEFT ARM  Final   Special Requests   Final    BOTTLES DRAWN AEROBIC AND ANAEROBIC Blood Culture adequate volume   Culture   Final    NO GROWTH 3 DAYS Performed at [facility], [address], [place], [place] [address]    Report Status PENDING  Incomplete   Blood culture (routine x 2)     Status: None (Preliminary result)   Collection Time: 02/17/18 12:50 AM  Result Value Ref Range Status   Specimen Description LEFT ANTECUBITAL  Final   Special Requests   Final    BOTTLES DRAWN AEROBIC AND ANAEROBIC Blood Culture adequate volume   Culture   Final    NO GROWTH 3 DAYS Performed at [facility], [address], [place], [place] [address]    Report Status PENDING  Incomplete     Labs: BNP (last 3 results) Recent Labs    01/30/18 0357 02/01/18 1808 02/17/18 0045  BNP 39.0 202.0* 277.8*   Basic Metabolic Panel: Recent Labs  Lab 02/17/18 0045 02/17/18 0421 02/18/18 0431 02/19/18 0414  NA 133* 133* 133* 135  K 4.3 4.3 4.9 4.3  CL 101 99* 101 102  CO2 26 28 26 30   GLUCOSE 116* 153* 298* 57*  BUN 20 21* 25* 26*  CREATININE 0.96 1.02 1.09 1.16  CALCIUM 8.3* 8.0* 8.4* 8.3*   Liver Function Tests: Recent Labs  Lab 02/17/18 0045  AST 20  ALT 28  ALKPHOS 61  BILITOT 0.5  PROT 5.7*  ALBUMIN 2.9*   No results for input(s): LIPASE, AMYLASE in the last 168 hours. Recent Labs  Lab 02/17/18 0045  AMMONIA 17   CBC: Recent Labs  Lab 02/17/18 0045 02/18/18 0431 02/19/18 0414  WBC 6.2 12.3* 7.4  NEUTROABS 4.3  --   --   HGB 8.5* 8.2* 8.1*  HCT 27.1* 26.1* 26.8*  MCV 73.2* 73.3* 74.4*  PLT 195 260 270   Cardiac Enzymes: Recent Labs  Lab 02/17/18 0045  TROPONINI <0.03   BNP: Invalid input(s): POCBNP CBG: Recent Labs  Lab 02/19/18 1134 02/19/18 1640 02/19/18 2120 02/20/18 0748 02/20/18 1202  GLUCAP 199* 60* 156* 71 223*   D-Dimer No results for input(s): DDIMER in the last 72 hours. Hgb A1c No results for input(s): HGBA1C in the last 72 hours. Lipid Profile No results for input(s): CHOL, HDL, LDLCALC, TRIG, CHOLHDL, LDLDIRECT in the last 72 hours. Thyroid function studies No results  for input(s): TSH, T4TOTAL, T3FREE, THYROIDAB in the last 72 hours.  Invalid input(s): FREET3 Anemia work up No results  for input(s): VITAMINB12, FOLATE, FERRITIN, TIBC, IRON, RETICCTPCT in the last 72 hours. Urinalysis    Component Value Date/Time   COLORURINE STRAW (A) 02/06/2018 1142   APPEARANCEUR CLEAR 02/06/2018 1142   LABSPEC 1.013 02/06/2018 1142   PHURINE 6.0 02/06/2018 1142   GLUCOSEU >=500 (A) 02/06/2018 1142   HGBUR SMALL (A) 02/06/2018 1142   BILIRUBINUR NEGATIVE 02/06/2018 1142   KETONESUR NEGATIVE 02/06/2018 1142   PROTEINUR NEGATIVE 02/06/2018 1142   NITRITE NEGATIVE 02/06/2018 1142   LEUKOCYTESUR NEGATIVE 02/06/2018 1142   Sepsis Labs Invalid input(s): PROCALCITONIN,  WBC,  LACTICIDVEN Microbiology Recent Results (from the past 240 hour(s))  Blood culture (routine x 2)     Status: None (Preliminary result)   Collection Time: 02/17/18 12:35 AM  Result Value Ref Range Status   Specimen Description BLOOD LEFT ARM  Final   Special Requests   Final    BOTTLES DRAWN AEROBIC AND ANAEROBIC Blood Culture adequate volume   Culture   Final    NO GROWTH 3 DAYS Performed at [facility], [address] [place], [place] [address]    Report Status PENDING  Incomplete  Blood culture (routine x 2)     Status: None (Preliminary result)   Collection Time: 02/17/18 12:50 AM  Result Value Ref Range Status   Specimen Description LEFT ANTECUBITAL  Final   Special Requests   Final    BOTTLES DRAWN AEROBIC AND ANAEROBIC Blood Culture adequate volume   Culture   Final    NO GROWTH 3 DAYS Performed at [facility], [address], [place], [place] [address]    Report Status PENDING  Incomplete     Time coordinating discharge: Over 30 minutes  SIGNED:   Cristy Folks, MD  [facility] 02/20/2018, 12:29 PM   If 7PM-7AM, please contact night-coverage [email or web address] Password [email or web address]

## 2018-02-20 NOTE — Discharge Instructions (Signed)
See primary MD in 1-2 weeks  Have BMP and CBC  Follow up on results of  A 1c  Keep log of blood sugars at home 4 times a day and give to doctor

## 2018-02-20 NOTE — Care Management Note (Signed)
Case Management Note  Patient Details  Name: SI JACHIM MRN: [id number] Date of Birth: [birth year]   Expected Discharge Date:  02/20/18               Expected Discharge Plan:  [place]  In-House Referral:     Discharge planning Services  CM Consult  Post Acute Care Choice:  [place], Resumption of Svcs/PTA Provider Choice offered to:  Patient  DME Arranged:    DME Agency:     HH Arranged:    [place] Agency:  [place]  Status of Service:  Completed, signed off  If discussed at [place] of Stay Meetings, dates discussed:    Additional Comments: DC home today. Juliann Pulse, [facility] rep, aware of resumption. Pt aware HH has 48 hrs to make resumption visit.   Sherald Barge, RN 02/20/2018, 1:52 PM

## 2018-02-21 ENCOUNTER — Other Ambulatory Visit: Payer: Self-pay | Admitting: Physician Assistant

## 2018-02-21 ENCOUNTER — Other Ambulatory Visit: Payer: Self-pay

## 2018-02-21 ENCOUNTER — Telehealth: Payer: Self-pay

## 2018-02-21 DIAGNOSIS — Z794 Long term (current) use of insulin: Secondary | ICD-10-CM | POA: Diagnosis not present

## 2018-02-21 DIAGNOSIS — E1122 Type 2 diabetes mellitus with diabetic chronic kidney disease: Secondary | ICD-10-CM | POA: Diagnosis not present

## 2018-02-21 DIAGNOSIS — I4891 Unspecified atrial fibrillation: Secondary | ICD-10-CM | POA: Diagnosis not present

## 2018-02-21 DIAGNOSIS — J441 Chronic obstructive pulmonary disease with (acute) exacerbation: Secondary | ICD-10-CM | POA: Diagnosis not present

## 2018-02-21 DIAGNOSIS — Z7901 Long term (current) use of anticoagulants: Secondary | ICD-10-CM | POA: Diagnosis not present

## 2018-02-21 DIAGNOSIS — E559 Vitamin D deficiency, unspecified: Secondary | ICD-10-CM | POA: Diagnosis not present

## 2018-02-21 DIAGNOSIS — J189 Pneumonia, unspecified organism: Secondary | ICD-10-CM | POA: Diagnosis not present

## 2018-02-21 DIAGNOSIS — K219 Gastro-esophageal reflux disease without esophagitis: Secondary | ICD-10-CM | POA: Diagnosis not present

## 2018-02-21 DIAGNOSIS — J9611 Chronic respiratory failure with hypoxia: Secondary | ICD-10-CM | POA: Diagnosis not present

## 2018-02-21 DIAGNOSIS — I130 Hypertensive heart and chronic kidney disease with heart failure and stage 1 through stage 4 chronic kidney disease, or unspecified chronic kidney disease: Secondary | ICD-10-CM | POA: Diagnosis not present

## 2018-02-21 DIAGNOSIS — E1165 Type 2 diabetes mellitus with hyperglycemia: Secondary | ICD-10-CM | POA: Diagnosis not present

## 2018-02-21 DIAGNOSIS — I5032 Chronic diastolic (congestive) heart failure: Secondary | ICD-10-CM

## 2018-02-21 DIAGNOSIS — Z9981 Dependence on supplemental oxygen: Secondary | ICD-10-CM | POA: Diagnosis not present

## 2018-02-21 DIAGNOSIS — J440 Chronic obstructive pulmonary disease with acute lower respiratory infection: Secondary | ICD-10-CM | POA: Diagnosis not present

## 2018-02-21 DIAGNOSIS — Z7951 Long term (current) use of inhaled steroids: Secondary | ICD-10-CM | POA: Diagnosis not present

## 2018-02-21 DIAGNOSIS — N183 Chronic kidney disease, stage 3 (moderate): Secondary | ICD-10-CM | POA: Diagnosis not present

## 2018-02-21 DIAGNOSIS — E785 Hyperlipidemia, unspecified: Secondary | ICD-10-CM | POA: Diagnosis not present

## 2018-02-21 DIAGNOSIS — Z09 Encounter for follow-up examination after completed treatment for conditions other than malignant neoplasm: Secondary | ICD-10-CM

## 2018-02-21 DIAGNOSIS — J8410 Pulmonary fibrosis, unspecified: Secondary | ICD-10-CM | POA: Diagnosis not present

## 2018-02-21 DIAGNOSIS — Z7982 Long term (current) use of aspirin: Secondary | ICD-10-CM | POA: Diagnosis not present

## 2018-02-21 DIAGNOSIS — R600 Localized edema: Secondary | ICD-10-CM

## 2018-02-21 NOTE — Telephone Encounter (Signed)
Patient was released from the hospital on 5/19 and Sarah with advanced home care is requesting orders to continue to  see patient  1 once a week for 1 week.2  times a week for 1 week, 1 once a week for 2 weeks

## 2018-02-21 NOTE — Patient Outreach (Signed)
[facility]) Care Management  02/21/2018  Edward [name] 07-May-1935 [id number]   83 year old male referred to [facility] Management by  for medication assistance.  [place] services requested for inhalers and insulin.  PMHx includes, but not limited to, hypertension, heart failure, atrial fibrillation, COPD, pulmonary fibrosis, GERD, Type 2 diabetes mellitus, Stage III chronic kidney disease, and  hypercholesterolemia.   Successful outreach attempt to Mr. and Edward [name].  HIPAA identifiers verified.  Patient and wife were unable to speak about his medications.  Wife was vague when asked if they had trouble affording his prescriptions.   Plan: Home visit Friday 5/24 at 0900 for medication review and to discuss medication assistance needs for insulin and inhalers.   [name], PharmD Clinical Pharmacist [place] [phone]

## 2018-02-21 NOTE — Telephone Encounter (Signed)
Approved.  

## 2018-02-22 ENCOUNTER — Other Ambulatory Visit: Payer: Self-pay

## 2018-02-22 LAB — CULTURE, BLOOD (ROUTINE X 2)
CULTURE: NO GROWTH
CULTURE: NO GROWTH
SPECIAL REQUESTS: ADEQUATE
Special Requests: ADEQUATE

## 2018-02-22 NOTE — Telephone Encounter (Signed)
Edward [name] is aware.

## 2018-02-22 NOTE — Patient Outreach (Signed)
Edward [name]  [facility] Care Manager  02/22/2018   Edward [name] [birth year] [id number]  83 year old male referred to [place] [name] by  for medication assistance. [facility] services requested for inhalers and insulin.  PMHx includes, but not limited to, hypertension, heart failure, atrial fibrillation, COPD, pulmonary fibrosis, GERD, Type 2 diabetes mellitus, Stage III chronic kidney disease, and hypercholesterolemia.   Successful home visit with Edward [name].  HIPAA identifiers verified.  Subjective:  Edward [name] was at home resting in bed with oxygen when I arrived.  He reports that he is feeling good.  He reports that he is not weighing daily and when asked he says that his feet are swollen.  He states that he checks his CBGs twice daily and that he sometimes has CBGs in the morning in the 70's of which he is asymptomatic.  He states that he drinks orange juice or a peanut butter cracker when needed for low glucose. He reports evening CBGs around 200.    Objective:  HgA1c 11.8% on 02/19/18 down from 13.3% on 02/02/18.  Encounter Medications:  Outpatient Encounter Medications as of 02/22/2018  Medication Sig Note  . albuterol (PROVENTIL) (2.5 MG/3ML) 0.083% nebulizer solution INHALE 1 VIAL VIA NEBULIZER EVERY 6 HOURS AS NEEDED FOR WHEEZING OR SHORTNESS OF BREATH   . apixaban (ELIQUIS) 2.5 MG TABS tablet Take 1 tablet (2.5 mg total) by mouth 2 (two) times daily.   Marland Kitchen aspirin EC 81 MG tablet Take 81 mg by mouth daily.   . budesonide (PULMICORT) 0.5 MG/2ML nebulizer solution Take 2 mLs (0.5 mg total) by nebulization 2 (two) times daily.   . cloNIDine (CATAPRES) 0.1 MG tablet TAKE 1 TABLET BY MOUTH 2 TIMES A DAY   . diltiazem (CARDIZEM CD) 240 MG 24 hr capsule TAKE 1 CAPSULE BY MOUTH DAILY   . furosemide (LASIX) 40 MG tablet Take 1 tablet (40 mg total) by mouth every other day.   . insulin detemir (LEVEMIR) 100 unit/ml SOLN Inject 0.2 mLs (20 Units total)  into the skin 2 (two) times daily.   . insulin lispro (HUMALOG) 100 UNIT/ML KwikPen Junior Inject 0.08 mLs (8 Units total) into the skin 3 (three) times daily.   Marland Kitchen ipratropium (ATROVENT) 0.02 % nebulizer solution Take 2.5 mLs (0.5 mg total) by nebulization 4 (four) times daily.   Marland Kitchen losartan (COZAAR) 100 MG tablet TAKE 1 TABLET BY MOUTH DAILY   . metoprolol tartrate (LOPRESSOR) 25 MG tablet TAKE 1 TABLET BY MOUTH TWICE DAILY   . omeprazole (PRILOSEC) 20 MG capsule Take 1 capsule (20 mg total) by mouth daily.   [name] DELICA LANCETS 70W MISC USE TO CHECK BLOOD SUGAR TWICE DAILY AS DIRECTED   . OXYGEN Inhale 3 L into the lungs daily.    . potassium chloride SA (K-DUR,KLOR-CON) 20 MEQ tablet Take 20 mEq by mouth every other day. When he takes lasix   . pravastatin (PRAVACHOL) 80 MG tablet Take 1 tablet (80 mg total) by mouth at bedtime.   . [DISCONTINUED] potassium chloride SA (K-DUR,KLOR-CON) 20 MEQ tablet Take 20 mEq by mouth 2 (two) times daily.   Marland Kitchen guaiFENesin (MUCINEX) 600 MG 12 hr tablet Take 1 tablet (600 mg total) by mouth 2 (two) times daily. (Patient not taking: Reported on 02/22/2018)   . [DISCONTINUED] potassium chloride SA (K-DUR,KLOR-CON) 20 MEQ tablet Take 1 tablet (20 mEq total) by mouth every other day. Take with lasix. 02/04/2018: Called to verify, pharmacy does not  exist  . [DISCONTINUED] potassium chloride SA (K-DUR,KLOR-CON) 20 MEQ tablet TAKE 2 TABLETS BY MOUTH THREE TIMES A DAY    No facility-administered encounter medications on file as of 02/22/2018.     Functional Status:  In your present state of health, do you have any difficulty performing the following activities: 02/17/2018 02/01/2018  Hearing? N N  Vision? N N  Difficulty concentrating or making decisions? N N  Walking or climbing stairs? Y Y  Dressing or bathing? N N  Doing errands, shopping? Y Y  Some recent data might be hidden    Fall/Depression Screening: Fall Risk  09/12/2017 08/15/2017 07/20/2017  Falls  in the past year? Yes No No  Number falls in past yr: 1 - -  Injury with Fall? No - -   PHQ 2/9 Scores 09/12/2017 08/15/2017 07/20/2017 06/20/2017 07/27/2016 03/16/2016 02/09/2015  PHQ - 2 Score 0 0 0 0 0 0 1  PHQ- 9 Score 0 0 - 0 - - -   ASSESSMENT: Date Discharged from Hospital: 02/20/18 Date Medication Reconciliation Performed: 02/22/2018  Medications Changed at Discharge:   Insulin detemir 20 units twice daily.  Insulin Lispro 8 units three times daily with meals.   Patient was recently discharged from hospital and all medications have been reviewed  Drugs sorted by system:  Cardiovascular: apixiban, aspirin, clonidine, diltiazem, furosemide, losartan,  Metoprolol, potassium chloride, pravastatin  Pulmonary/Allergy: albuterol, budesonide, ipratropium  Gastrointestinal: omeprazole   Endocrine: insulin detemir, insulin lispro  Miscellaneous: guaifenesin  Medications to avoid in the elderly:  Per the [place] List, omeprazole may increase risk of C. difficile bone loss & fractures.  Avoid scheduled use for >8 weeks unless high risk (oral corticosteroids, chronic NSAIDs, erosive esophagitis, hypersecretory conditions or demonstrated need for maintenance.)  Other medication issues noted:  Upon medication review, Edward [name] states that he is giving himself 30 units of levemir twice daily.  Counseled him that his dose is 20 units twice daily.  Patient verbalized understanding   Patient also had a prescription bottle for potassium 20 meq- Take 2 tablet three times daily.  He reports that he is taking one tablet every other day when he takes his lasix.   His pharmacy of record did not have a script with the new directions on file.  I would like to get a prescription sent over to his pharmacy, [facility] with the new directions to avoid the potential of Edward [name] taking too much potassium.   Patient also needs a prescription with the new directions for lasix one tablet every other day  called into his pharmacy [facility].   Counseled Edward [name] on the importance of weighing daily and recording on a log. Reviewed that he should notify his PCP if he gains > 5 lbs in a week.  Patient weight during visit is 186lbs, which is similar to his weight on discharge.  Per discharge note of Dr Herbert Moors on 5/22, patient took an extra dose of lasix today due to his swollen feet.  Also, reviewed the signs and symptoms of of hypoglycemia.  Encouraged patient to get some glucose tablets to keep with him in the event of hypoglycemia while he is out.   Medication Assistance: Patient did express having difficulty affording his insulins, nebulizers and apixaban.  Per financial discussion, patient may qualify for Extra Help LIS.  Completed the online application today.  It will take about a month to hear a response.   If he does not qualify for Extra Help LIS, [facility] will begin applying for medication assistance from the various drug companies.    Plan: Route note to PCP, Dena Billet, PCP.  Ask for new prescriptions for potassium and lasix.  Inform her patient was taking Levemir incorrectly.  Follow up with patient in one month to hear results of Extra Help application.   [name], PharmD Clinical Pharmacist [place] [phone]

## 2018-02-25 DIAGNOSIS — E785 Hyperlipidemia, unspecified: Secondary | ICD-10-CM | POA: Diagnosis not present

## 2018-02-25 DIAGNOSIS — Z7901 Long term (current) use of anticoagulants: Secondary | ICD-10-CM | POA: Diagnosis not present

## 2018-02-25 DIAGNOSIS — K219 Gastro-esophageal reflux disease without esophagitis: Secondary | ICD-10-CM | POA: Diagnosis not present

## 2018-02-25 DIAGNOSIS — Z9981 Dependence on supplemental oxygen: Secondary | ICD-10-CM | POA: Diagnosis not present

## 2018-02-25 DIAGNOSIS — I5032 Chronic diastolic (congestive) heart failure: Secondary | ICD-10-CM | POA: Diagnosis not present

## 2018-02-25 DIAGNOSIS — Z794 Long term (current) use of insulin: Secondary | ICD-10-CM | POA: Diagnosis not present

## 2018-02-25 DIAGNOSIS — Z7982 Long term (current) use of aspirin: Secondary | ICD-10-CM | POA: Diagnosis not present

## 2018-02-25 DIAGNOSIS — I130 Hypertensive heart and chronic kidney disease with heart failure and stage 1 through stage 4 chronic kidney disease, or unspecified chronic kidney disease: Secondary | ICD-10-CM | POA: Diagnosis not present

## 2018-02-25 DIAGNOSIS — E1165 Type 2 diabetes mellitus with hyperglycemia: Secondary | ICD-10-CM | POA: Diagnosis not present

## 2018-02-25 DIAGNOSIS — E559 Vitamin D deficiency, unspecified: Secondary | ICD-10-CM | POA: Diagnosis not present

## 2018-02-25 DIAGNOSIS — J9611 Chronic respiratory failure with hypoxia: Secondary | ICD-10-CM | POA: Diagnosis not present

## 2018-02-25 DIAGNOSIS — J189 Pneumonia, unspecified organism: Secondary | ICD-10-CM | POA: Diagnosis not present

## 2018-02-25 DIAGNOSIS — J440 Chronic obstructive pulmonary disease with acute lower respiratory infection: Secondary | ICD-10-CM | POA: Diagnosis not present

## 2018-02-25 DIAGNOSIS — I4891 Unspecified atrial fibrillation: Secondary | ICD-10-CM | POA: Diagnosis not present

## 2018-02-25 DIAGNOSIS — Z7951 Long term (current) use of inhaled steroids: Secondary | ICD-10-CM | POA: Diagnosis not present

## 2018-02-25 DIAGNOSIS — N183 Chronic kidney disease, stage 3 (moderate): Secondary | ICD-10-CM | POA: Diagnosis not present

## 2018-02-25 DIAGNOSIS — J441 Chronic obstructive pulmonary disease with (acute) exacerbation: Secondary | ICD-10-CM | POA: Diagnosis not present

## 2018-02-25 DIAGNOSIS — E1122 Type 2 diabetes mellitus with diabetic chronic kidney disease: Secondary | ICD-10-CM | POA: Diagnosis not present

## 2018-02-25 DIAGNOSIS — J8410 Pulmonary fibrosis, unspecified: Secondary | ICD-10-CM | POA: Diagnosis not present

## 2018-02-26 DIAGNOSIS — J441 Chronic obstructive pulmonary disease with (acute) exacerbation: Secondary | ICD-10-CM | POA: Diagnosis not present

## 2018-02-26 DIAGNOSIS — I5032 Chronic diastolic (congestive) heart failure: Secondary | ICD-10-CM | POA: Diagnosis not present

## 2018-02-26 DIAGNOSIS — E1122 Type 2 diabetes mellitus with diabetic chronic kidney disease: Secondary | ICD-10-CM | POA: Diagnosis not present

## 2018-02-26 DIAGNOSIS — Z794 Long term (current) use of insulin: Secondary | ICD-10-CM | POA: Diagnosis not present

## 2018-02-26 DIAGNOSIS — J189 Pneumonia, unspecified organism: Secondary | ICD-10-CM | POA: Diagnosis not present

## 2018-02-26 DIAGNOSIS — Z7951 Long term (current) use of inhaled steroids: Secondary | ICD-10-CM | POA: Diagnosis not present

## 2018-02-26 DIAGNOSIS — J8410 Pulmonary fibrosis, unspecified: Secondary | ICD-10-CM | POA: Diagnosis not present

## 2018-02-26 DIAGNOSIS — Z7901 Long term (current) use of anticoagulants: Secondary | ICD-10-CM | POA: Diagnosis not present

## 2018-02-26 DIAGNOSIS — J9611 Chronic respiratory failure with hypoxia: Secondary | ICD-10-CM | POA: Diagnosis not present

## 2018-02-26 DIAGNOSIS — I4891 Unspecified atrial fibrillation: Secondary | ICD-10-CM | POA: Diagnosis not present

## 2018-02-26 DIAGNOSIS — N183 Chronic kidney disease, stage 3 (moderate): Secondary | ICD-10-CM | POA: Diagnosis not present

## 2018-02-26 DIAGNOSIS — E785 Hyperlipidemia, unspecified: Secondary | ICD-10-CM | POA: Diagnosis not present

## 2018-02-26 DIAGNOSIS — K219 Gastro-esophageal reflux disease without esophagitis: Secondary | ICD-10-CM | POA: Diagnosis not present

## 2018-02-26 DIAGNOSIS — Z9981 Dependence on supplemental oxygen: Secondary | ICD-10-CM | POA: Diagnosis not present

## 2018-02-26 DIAGNOSIS — I130 Hypertensive heart and chronic kidney disease with heart failure and stage 1 through stage 4 chronic kidney disease, or unspecified chronic kidney disease: Secondary | ICD-10-CM | POA: Diagnosis not present

## 2018-02-26 DIAGNOSIS — Z7982 Long term (current) use of aspirin: Secondary | ICD-10-CM | POA: Diagnosis not present

## 2018-02-26 DIAGNOSIS — E1165 Type 2 diabetes mellitus with hyperglycemia: Secondary | ICD-10-CM | POA: Diagnosis not present

## 2018-02-26 DIAGNOSIS — E559 Vitamin D deficiency, unspecified: Secondary | ICD-10-CM | POA: Diagnosis not present

## 2018-02-26 DIAGNOSIS — J440 Chronic obstructive pulmonary disease with acute lower respiratory infection: Secondary | ICD-10-CM | POA: Diagnosis not present

## 2018-02-27 ENCOUNTER — Telehealth: Payer: Self-pay

## 2018-02-27 DIAGNOSIS — Z09 Encounter for follow-up examination after completed treatment for conditions other than malignant neoplasm: Secondary | ICD-10-CM

## 2018-02-27 DIAGNOSIS — R600 Localized edema: Secondary | ICD-10-CM

## 2018-02-27 DIAGNOSIS — I5032 Chronic diastolic (congestive) heart failure: Secondary | ICD-10-CM

## 2018-02-27 MED ORDER — POTASSIUM CHLORIDE CRYS ER 20 MEQ PO TBCR: 20.0000 meq | EXTENDED_RELEASE_TABLET | ORAL | 0 refills | Status: DC

## 2018-02-27 MED ORDER — FUROSEMIDE 40 MG PO TABS
40.0000 mg | ORAL_TABLET | ORAL | 0 refills | Status: DC
Start: 2018-02-27 — End: 2018-02-28

## 2018-02-27 NOTE — Telephone Encounter (Signed)
Carmie End with [facility] called and left a message asking for orders for resumption of care and provide community resources. I called Olivia Mackie however her VM is  full

## 2018-02-27 NOTE — Telephone Encounter (Signed)
I received a staff message from [place] see not below:  Jonelle Sidle,  I saw Mr. Schellhase for a home visit medication review. He sees Dr. Doren Custard. I am trying to get new prescriptions with the proper directions called into his pharmacy for his lasix and potassium.  He says they are both every other day now. His old potassium label had him taking a large dose/day, which could be very bad if he took too much. Can you please call in new scripts with the correct directions to [facility] so next time he orders the directions are correct.   Thanks,  [name], PharmD  [place]  [phone]     I contacted [name] by phone as well as staff message to inform her that the potassium as well as the lasix had the correct directions listed which is to take lasix 40 mg by mouth every other day , and potassium  take 20 meq every other day when taking his lasix. LVM with this information. I will send in refills for the potassium as well as the lasix to [facility]

## 2018-02-28 ENCOUNTER — Ambulatory Visit (INDEPENDENT_AMBULATORY_CARE_PROVIDER_SITE_OTHER): Payer: Medicare Other | Admitting: Physician Assistant

## 2018-02-28 ENCOUNTER — Encounter: Payer: Self-pay | Admitting: Physician Assistant

## 2018-02-28 VITALS — BP 136/60 | HR 72 | Temp 98.0°F | Resp 18 | Ht 65.0 in | Wt 189.8 lb

## 2018-02-28 DIAGNOSIS — Z9114 Patient's other noncompliance with medication regimen: Secondary | ICD-10-CM | POA: Diagnosis not present

## 2018-02-28 DIAGNOSIS — I5032 Chronic diastolic (congestive) heart failure: Secondary | ICD-10-CM | POA: Diagnosis not present

## 2018-02-28 DIAGNOSIS — R600 Localized edema: Secondary | ICD-10-CM | POA: Diagnosis not present

## 2018-02-28 DIAGNOSIS — E11649 Type 2 diabetes mellitus with hypoglycemia without coma: Secondary | ICD-10-CM | POA: Diagnosis not present

## 2018-02-28 DIAGNOSIS — Z09 Encounter for follow-up examination after completed treatment for conditions other than malignant neoplasm: Secondary | ICD-10-CM | POA: Diagnosis not present

## 2018-02-28 LAB — CBC WITH DIFFERENTIAL/PLATELET
Basophils Absolute: 22 cells/uL (ref 0–200)
Basophils Relative: 0.4 %
Eosinophils Absolute: 370 cells/uL (ref 15–500)
Eosinophils Relative: 6.6 %
HEMATOCRIT: 26.7 % — AB (ref 38.5–50.0)
HEMOGLOBIN: 8.2 g/dL — AB (ref 13.2–17.1)
LYMPHS ABS: 1562 {cells}/uL (ref 850–3900)
MCH: 22.8 pg — ABNORMAL LOW (ref 27.0–33.0)
MCHC: 30.7 g/dL — ABNORMAL LOW (ref 32.0–36.0)
MCV: 74.2 fL — AB (ref 80.0–100.0)
MPV: 9.8 fL (ref 7.5–12.5)
Monocytes Relative: 13.8 %
NEUTROS ABS: 2873 {cells}/uL (ref 1500–7800)
Neutrophils Relative %: 51.3 %
Platelets: 341 10*3/uL (ref 140–400)
RBC: 3.6 10*6/uL — ABNORMAL LOW (ref 4.20–5.80)
RDW: 17.6 % — ABNORMAL HIGH (ref 11.0–15.0)
Total Lymphocyte: 27.9 %
WBC: 5.6 10*3/uL (ref 3.8–10.8)
WBCMIX: 773 {cells}/uL (ref 200–950)

## 2018-02-28 LAB — BASIC METABOLIC PANEL WITH GFR
BUN/Creatinine Ratio: 25 (calc) — ABNORMAL HIGH (ref 6–22)
BUN: 27 mg/dL — AB (ref 7–25)
CALCIUM: 8.8 mg/dL (ref 8.6–10.3)
CO2: 33 mmol/L — ABNORMAL HIGH (ref 20–32)
CREATININE: 1.08 mg/dL (ref 0.70–1.11)
Chloride: 95 mmol/L — ABNORMAL LOW (ref 98–110)
GFR, EST AFRICAN AMERICAN: 75 mL/min/{1.73_m2} (ref >=60)
GFR, EST NON AFRICAN AMERICAN: 64 mL/min/{1.73_m2} (ref >=60)
Glucose, Bld: 105 mg/dL — ABNORMAL HIGH (ref 65–99)
Potassium: 5 mmol/L (ref 3.5–5.3)
Sodium: 135 mmol/L (ref 135–146)

## 2018-02-28 MED ORDER — POTASSIUM CHLORIDE CRYS ER 20 MEQ PO TBCR: 20.0000 meq | EXTENDED_RELEASE_TABLET | ORAL | 0 refills | Status: DC

## 2018-02-28 MED ORDER — FUROSEMIDE 40 MG PO TABS: 40.0000 mg | ORAL_TABLET | ORAL | 0 refills | Status: DC

## 2018-02-28 NOTE — Telephone Encounter (Signed)
He had office visit with me today.   I sent in the prescriptions as requested at that time.   I also sent back a staff message to the [facility] staff who had sent me a staff message regarding him and these Rx requests.  As well, that [facility] staff member has already sent me a reply on the staff message where she had been corresponding with me.

## 2018-02-28 NOTE — Progress Notes (Signed)
Patient ID: Edward [name] MRN: [id number], DOB: 01-12-1935, 83 y.o. Date of Encounter: _0 @  Chief Complaint:  Chief Complaint  Patient presents with  . Hospitalization Follow-up    HPI: 83 y.o. year old male  presents low up after recent hospitalization.  Today I have reviewed his hospital discharge summary.   The following section is copied directly from that discharge summary.   Admit date: 02/17/2018 Discharge date: 02/20/2018  Admitted From: Home Disposition: Home  Recommendations for Outpatient Follow-up:  1. Follow up with PCP in 1-2 weeks 2. Please obtain BMP/CBC in one week 3. Please follow up on the following pending results: Hemoglobin A1c 4. Please keep a log of your blood sugars at home 4 times a day and give them to your PCP.  Home Health: Yes resume home health PT Equipment/Devices: Chronic 2 L nasal cannula  Discharge Condition: Stable CODE STATUS: Full Diet recommendation: Heart Healthy / Carb Modified   Brief/Interim Summary:  #) Sepsis rule out: Patient was admitted with hypothermia, hypoglycemia, bradycardia and was initially thought to have sepsis due to pneumonia.  His procalcitonin was negative and antibiotics were withheld.  It was felt that the x-ray findings are more consistent with mild heart failure and that his symptoms were related to his significant hypoglycemia in the setting of insulin use.  See below for hypoglycemia plan.  #) Hypoglycemia in the setting of type 2 diabetes: It was felt that this was the most likely cause of patient's hypothermia, bradycardia, altered mental status.  It was felt to be most likely secondary to excessive insulin use.  His insulin here was decreased to 20 units of Levemir twice a day and his mealtime insulin was discontinued entirely.  He was told to check his blood sugars and given a sliding scale here.  He was discharged home only on Levemir 20 units twice daily and short acting insulin 8 units with  meals to follow-up as an outpatient to further uptitrate and recheck his blood sugars frequently before meals see if he needed further up titration of his insulin.  #) Paroxysmal atrial fibrillation: Patient was continued on apixaban 2.5 mg twice daily, diltiazem 240 mg daily, metoprolol tartrate 25 mg twice daily.  #) Chronic respiratory failure due to COPD on 2 L nasal cannula: Patient was continued on chronic micro-dilators, inhaled corticosteroids.  #) chronic diastolic heart failure: Patient was told to restart furosemide 40 mg every other day as he was noted to have pulmonary edema chest x-ray.  He was told to weigh himself daily and to limit his salt and fluid intake.  He was told to take an extra dose of furosemide if his weight went up by more than 5 pounds we noted worsening swelling of his legs.  #) Hypertension/hyperlipidemia: Patient was continued on diltiazem, beta-blocker, pravastatin.   -------------------------------------------------------------------------------------------------------------------------------------------------------------------------------------------------------------------------------------------  Today he reports that he has been giving 1 type of insulin 30 units twice daily and the other type of insulin 8 units 3 times daily.  He has a piece of paper with 2 numbers written down.  1 of these is 87.  States that that was his blood sugar reading yesterday morning.  The reading is 83.  Says that that was his blood sugar reading yesterday at 2:30 PM.  Otherwise has no specific concerns.  No symptoms to address.  Today I also reviewed a staff message that I had received from [facility] and also reviewed their notes in chart review. Did need to send  prescription for the new dosing on his Lasix and potassium to as at here pharmacy. Home visit there was issues regarding him taking the correct dose of insulin as well as issues taking correct dose of Lasix and  potassium.       Past Medical History:  Diagnosis Date  . Allergy    Rhinitis  . Bronchitis   . Chronic respiratory failure ([place])   . Colon polyps   . COPD (chronic obstructive pulmonary disease) ([place])   . Diabetes mellitus   . Elevated lipids   . Hypercholesterolemia   . Hypertension   . Noncompliance   . On home O2    2L N/C   . PSA elevation   . Pulmonary fibrosis ([place])   . Vitamin D deficiency      Home Meds: Outpatient Medications Prior to Visit  Medication Sig Dispense Refill  . albuterol (PROVENTIL) (2.5 MG/3ML) 0.083% nebulizer solution INHALE 1 VIAL VIA NEBULIZER EVERY 6 HOURS AS NEEDED FOR WHEEZING OR SHORTNESS OF BREATH 360 mL 5  . apixaban (ELIQUIS) 2.5 MG TABS tablet Take 1 tablet (2.5 mg total) by mouth 2 (two) times daily. 60 tablet 2  . aspirin EC 81 MG tablet Take 81 mg by mouth daily.    . budesonide (PULMICORT) 0.5 MG/2ML nebulizer solution Take 2 mLs (0.5 mg total) by nebulization 2 (two) times daily. 120 mL 5  . cloNIDine (CATAPRES) 0.1 MG tablet TAKE 1 TABLET BY MOUTH 2 TIMES A DAY 60 tablet 2  . diltiazem (CARDIZEM CD) 240 MG 24 hr capsule TAKE 1 CAPSULE BY MOUTH DAILY 30 capsule 2  . insulin detemir (LEVEMIR) 100 unit/ml SOLN Inject 0.2 mLs (20 Units total) into the skin 2 (two) times daily. 10 mL 3  . insulin lispro (HUMALOG) 100 UNIT/ML KwikPen Junior Inject 0.08 mLs (8 Units total) into the skin 3 (three) times daily. 3 mL 3  . ipratropium (ATROVENT) 0.02 % nebulizer solution Take 2.5 mLs (0.5 mg total) by nebulization 4 (four) times daily. 25 mL 12  . losartan (COZAAR) 100 MG tablet TAKE 1 TABLET BY MOUTH DAILY 30 tablet 0  . metoprolol tartrate (LOPRESSOR) 25 MG tablet TAKE 1 TABLET BY MOUTH TWICE DAILY 60 tablet 0  . omeprazole (PRILOSEC) 20 MG capsule Take 1 capsule (20 mg total) by mouth daily. 30 capsule 3  . ONETOUCH DELICA LANCETS 18A MISC USE TO CHECK BLOOD SUGAR TWICE DAILY AS DIRECTED 100 each 2  . OXYGEN Inhale 3 L into the lungs  daily.     . pravastatin (PRAVACHOL) 80 MG tablet Take 1 tablet (80 mg total) by mouth at bedtime. 30 tablet 3  . furosemide (LASIX) 40 MG tablet Take 1 tablet (40 mg total) by mouth every other day. 90 tablet 0  . potassium chloride SA (K-DUR,KLOR-CON) 20 MEQ tablet Take 1 tablet (20 mEq total) by mouth every other day. When he takes lasix 90 tablet 0  . guaiFENesin (MUCINEX) 600 MG 12 hr tablet Take 1 tablet (600 mg total) by mouth 2 (two) times daily. (Patient not taking: Reported on 02/22/2018) 40 tablet 0   No facility-administered medications prior to visit.     Allergies:  Allergies  Allergen Reactions  . Ace Inhibitors Other (See Comments)    Hyperkalemia--07/23/2013:patient states not familiar with the following allergy    Social History   Socioeconomic History  . Marital status: Married    Spouse name: Not on file  . Number of children: Not on file  .  Years of education: Not on file  . Highest education level: Not on file  Occupational History  . 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 Financial resource strain: Not on file  . Food insecurity:    Worry: Not on file    Inability: Not on file  . Transportation needs:    Medical: Not on file    Non-medical: Not on file  Tobacco Use  . Smoking status: Former Smoker    Packs/day: 1.50    Years: 60.00    Pack years: 90.00    Types: Cigarettes    Last attempt to quit: 12/31/2012    Years since quitting: 5.1  . Smokeless tobacco: Never Used  Substance and Sexual Activity  . Alcohol use: No  . Drug use: No  . Sexual activity: Yes    Birth control/protection: None  Lifestyle  . Physical activity:    Days per week: Not on file    Minutes per session: Not on file  . Stress: Not on file  Relationships  . Social connections:    Talks on phone: Not on file    Gets together: Not on file    Attends religious service: Not on file    Active member of club or organization: Not on file     Attends meetings of clubs or organizations: Not on file    Relationship status: Not on file  . Intimate partner violence:    Fear of current or ex partner: Not on file    Emotionally abused: Not on file    Physically abused: Not on file    Forced sexual activity: Not on file  Other Topics Concern  . Not on file  Social History Narrative  . Not on file    Family History  Problem Relation Age of Onset  . Heart disease Mother   . CAD Other   . Diabetes Other      Review of Systems:  See HPI for pertinent ROS. All other ROS negative.    Physical Exam: Blood pressure 136/60, pulse 72, temperature 98 F (36.7 C), temperature source Oral, resp. rate 18, height _0  (1.651 m), weight 86.1 kg (189 lb 12.8 oz), SpO2 92 %., Body mass index is 31.58 kg/m. General: WM Appears in no acute distress. Neck: Supple. No thyromegaly. No lymphadenopathy. Lungs: Clear bilaterally to auscultation without wheezes, rales, or rhonchi. Breathing is unlabored. Heart: RRR with S1 S2. No murmurs, rubs, or gallops. Musculoskeletal:  Strength and tone normal for age. Extremities/Skin: Trace LE edema bilaterally Neuro: Alert and oriented X 3. Moves all extremities spontaneously. Gait is normal. CNII-XII grossly in tact. Psych:  Responds to questions appropriately with a normal affect.     ASSESSMENT AND PLAN:  83 y.o. year old male with   1. Hospital discharge follow-up Summary wanted me to recheck CBC and be met today so checking these. See below for specific instructions regarding medications. - CBC with Differential/Platelet - BASIC METABOLIC PANEL WITH GFR - furosemide (LASIX) 40 MG tablet; Take 1 tablet (40 mg total) by mouth every other day.  Dispense: 90 tablet; Refill: 0  2. Chronic diastolic CHF (congestive heart failure) ([place]) He is to be taking Lasix at 40 mg every other day and potassium 20 mEq every other day on the times that he takes his Lasix.  On his paper today and have also [facility]  [facility] with this instructions on these prescriptions. He is  not checking daily weights as he has been instructed - BASIC METABOLIC PANEL WITH GFR  3. Non compliance w medication regimen He has a history of repetitive noncompliance.  Feel quite certain that he is illiterate and this is the underlying cause.  See prior notes. - BASIC METABOLIC PANEL WITH GFR  4. Uncontrolled type 2 diabetes mellitus with hypoglycemia without coma ([place]) Today I have discussed with him and marked on his medication list that he is supposed to be giving the Levemir 20 units twice a day-- NOT-what he is currently doing which is 30 units twice a day. He is to continue the short acting insulin 8 units with meals. - BASIC METABOLIC PANEL WITH GFR  Today  will check the CBC and BMET. Today have discussed and marked on his papers and sent new prescriptions with correct dosing regarding both types of insulin and his Lasix and potassium. As well [facility] will be following regarding all of these issues. Will have him schedule follow-up visit here with me 2 weeks.  Signed, [address] [place], [place], [facility] 02/28/2018 10:02 AM

## 2018-03-01 DIAGNOSIS — K219 Gastro-esophageal reflux disease without esophagitis: Secondary | ICD-10-CM | POA: Diagnosis not present

## 2018-03-01 DIAGNOSIS — Z794 Long term (current) use of insulin: Secondary | ICD-10-CM | POA: Diagnosis not present

## 2018-03-01 DIAGNOSIS — N183 Chronic kidney disease, stage 3 (moderate): Secondary | ICD-10-CM | POA: Diagnosis not present

## 2018-03-01 DIAGNOSIS — I4891 Unspecified atrial fibrillation: Secondary | ICD-10-CM | POA: Diagnosis not present

## 2018-03-01 DIAGNOSIS — J441 Chronic obstructive pulmonary disease with (acute) exacerbation: Secondary | ICD-10-CM | POA: Diagnosis not present

## 2018-03-01 DIAGNOSIS — E785 Hyperlipidemia, unspecified: Secondary | ICD-10-CM | POA: Diagnosis not present

## 2018-03-01 DIAGNOSIS — Z7951 Long term (current) use of inhaled steroids: Secondary | ICD-10-CM | POA: Diagnosis not present

## 2018-03-01 DIAGNOSIS — E1122 Type 2 diabetes mellitus with diabetic chronic kidney disease: Secondary | ICD-10-CM | POA: Diagnosis not present

## 2018-03-01 DIAGNOSIS — I130 Hypertensive heart and chronic kidney disease with heart failure and stage 1 through stage 4 chronic kidney disease, or unspecified chronic kidney disease: Secondary | ICD-10-CM | POA: Diagnosis not present

## 2018-03-01 DIAGNOSIS — E1165 Type 2 diabetes mellitus with hyperglycemia: Secondary | ICD-10-CM | POA: Diagnosis not present

## 2018-03-01 DIAGNOSIS — J8410 Pulmonary fibrosis, unspecified: Secondary | ICD-10-CM | POA: Diagnosis not present

## 2018-03-01 DIAGNOSIS — J9611 Chronic respiratory failure with hypoxia: Secondary | ICD-10-CM | POA: Diagnosis not present

## 2018-03-01 DIAGNOSIS — J440 Chronic obstructive pulmonary disease with acute lower respiratory infection: Secondary | ICD-10-CM | POA: Diagnosis not present

## 2018-03-01 DIAGNOSIS — E559 Vitamin D deficiency, unspecified: Secondary | ICD-10-CM | POA: Diagnosis not present

## 2018-03-01 DIAGNOSIS — Z7901 Long term (current) use of anticoagulants: Secondary | ICD-10-CM | POA: Diagnosis not present

## 2018-03-01 DIAGNOSIS — Z9981 Dependence on supplemental oxygen: Secondary | ICD-10-CM | POA: Diagnosis not present

## 2018-03-01 DIAGNOSIS — Z7982 Long term (current) use of aspirin: Secondary | ICD-10-CM | POA: Diagnosis not present

## 2018-03-01 DIAGNOSIS — J189 Pneumonia, unspecified organism: Secondary | ICD-10-CM | POA: Diagnosis not present

## 2018-03-01 DIAGNOSIS — I5032 Chronic diastolic (congestive) heart failure: Secondary | ICD-10-CM | POA: Diagnosis not present

## 2018-03-02 DIAGNOSIS — J449 Chronic obstructive pulmonary disease, unspecified: Secondary | ICD-10-CM | POA: Diagnosis not present

## 2018-03-04 DIAGNOSIS — E1165 Type 2 diabetes mellitus with hyperglycemia: Secondary | ICD-10-CM | POA: Diagnosis not present

## 2018-03-04 DIAGNOSIS — Z7982 Long term (current) use of aspirin: Secondary | ICD-10-CM | POA: Diagnosis not present

## 2018-03-04 DIAGNOSIS — K219 Gastro-esophageal reflux disease without esophagitis: Secondary | ICD-10-CM | POA: Diagnosis not present

## 2018-03-04 DIAGNOSIS — E1122 Type 2 diabetes mellitus with diabetic chronic kidney disease: Secondary | ICD-10-CM | POA: Diagnosis not present

## 2018-03-04 DIAGNOSIS — Z9981 Dependence on supplemental oxygen: Secondary | ICD-10-CM | POA: Diagnosis not present

## 2018-03-04 DIAGNOSIS — J9611 Chronic respiratory failure with hypoxia: Secondary | ICD-10-CM | POA: Diagnosis not present

## 2018-03-04 DIAGNOSIS — J441 Chronic obstructive pulmonary disease with (acute) exacerbation: Secondary | ICD-10-CM | POA: Diagnosis not present

## 2018-03-04 DIAGNOSIS — J440 Chronic obstructive pulmonary disease with acute lower respiratory infection: Secondary | ICD-10-CM | POA: Diagnosis not present

## 2018-03-04 DIAGNOSIS — Z794 Long term (current) use of insulin: Secondary | ICD-10-CM | POA: Diagnosis not present

## 2018-03-04 DIAGNOSIS — I4891 Unspecified atrial fibrillation: Secondary | ICD-10-CM | POA: Diagnosis not present

## 2018-03-04 DIAGNOSIS — J8410 Pulmonary fibrosis, unspecified: Secondary | ICD-10-CM | POA: Diagnosis not present

## 2018-03-04 DIAGNOSIS — E559 Vitamin D deficiency, unspecified: Secondary | ICD-10-CM | POA: Diagnosis not present

## 2018-03-04 DIAGNOSIS — I5032 Chronic diastolic (congestive) heart failure: Secondary | ICD-10-CM | POA: Diagnosis not present

## 2018-03-04 DIAGNOSIS — Z7951 Long term (current) use of inhaled steroids: Secondary | ICD-10-CM | POA: Diagnosis not present

## 2018-03-04 DIAGNOSIS — J189 Pneumonia, unspecified organism: Secondary | ICD-10-CM | POA: Diagnosis not present

## 2018-03-04 DIAGNOSIS — N183 Chronic kidney disease, stage 3 (moderate): Secondary | ICD-10-CM | POA: Diagnosis not present

## 2018-03-04 DIAGNOSIS — Z7901 Long term (current) use of anticoagulants: Secondary | ICD-10-CM | POA: Diagnosis not present

## 2018-03-04 DIAGNOSIS — E785 Hyperlipidemia, unspecified: Secondary | ICD-10-CM | POA: Diagnosis not present

## 2018-03-04 DIAGNOSIS — I130 Hypertensive heart and chronic kidney disease with heart failure and stage 1 through stage 4 chronic kidney disease, or unspecified chronic kidney disease: Secondary | ICD-10-CM | POA: Diagnosis not present

## 2018-03-07 ENCOUNTER — Other Ambulatory Visit: Payer: Self-pay

## 2018-03-07 ENCOUNTER — Emergency Department (HOSPITAL_COMMUNITY)
Admission: EM | Admit: 2018-03-07 | Discharge: 2018-03-07 | Disposition: A | Discharge status: Home / Self Care | Payer: Medicare Other | Attending: Emergency Medicine | Admitting: Emergency Medicine

## 2018-03-07 ENCOUNTER — Encounter (HOSPITAL_COMMUNITY): Payer: Self-pay | Admitting: *Deleted

## 2018-03-07 DIAGNOSIS — Z794 Long term (current) use of insulin: Secondary | ICD-10-CM | POA: Diagnosis not present

## 2018-03-07 DIAGNOSIS — Z7901 Long term (current) use of anticoagulants: Secondary | ICD-10-CM | POA: Diagnosis not present

## 2018-03-07 DIAGNOSIS — Z79899 Other long term (current) drug therapy: Secondary | ICD-10-CM | POA: Diagnosis not present

## 2018-03-07 DIAGNOSIS — I130 Hypertensive heart and chronic kidney disease with heart failure and stage 1 through stage 4 chronic kidney disease, or unspecified chronic kidney disease: Secondary | ICD-10-CM | POA: Insufficient documentation

## 2018-03-07 DIAGNOSIS — J449 Chronic obstructive pulmonary disease, unspecified: Secondary | ICD-10-CM | POA: Insufficient documentation

## 2018-03-07 DIAGNOSIS — Z87891 Personal history of nicotine dependence: Secondary | ICD-10-CM | POA: Diagnosis not present

## 2018-03-07 DIAGNOSIS — R040 Epistaxis: Secondary | ICD-10-CM | POA: Diagnosis not present

## 2018-03-07 DIAGNOSIS — N183 Chronic kidney disease, stage 3 (moderate): Secondary | ICD-10-CM | POA: Diagnosis not present

## 2018-03-07 DIAGNOSIS — I5032 Chronic diastolic (congestive) heart failure: Secondary | ICD-10-CM | POA: Diagnosis not present

## 2018-03-07 LAB — CBC WITH DIFFERENTIAL/PLATELET
Basophils Absolute: 0 10*3/uL (ref 0.0–0.1)
Basophils Relative: 1 %
Eosinophils Absolute: 0.2 10*3/uL (ref 0.0–0.7)
Eosinophils Relative: 3 %
HEMATOCRIT: 26.7 % — AB (ref 39.0–52.0)
HEMOGLOBIN: 8.2 g/dL — AB (ref 13.0–17.0)
LYMPHS ABS: 1.4 10*3/uL (ref 0.7–4.0)
Lymphocytes Relative: 20 %
MCH: 23.3 pg — AB (ref 26.0–34.0)
MCHC: 30.7 g/dL (ref 30.0–36.0)
MCV: 75.9 fL — AB (ref 78.0–100.0)
MONO ABS: 0.7 10*3/uL (ref 0.1–1.0)
MONOS PCT: 10 %
NEUTROS ABS: 4.6 10*3/uL (ref 1.7–7.7)
NEUTROS PCT: 66 %
Platelets: 360 10*3/uL (ref 150–400)
RBC: 3.52 MIL/uL — ABNORMAL LOW (ref 4.22–5.81)
RDW: 18.4 % — AB (ref 11.5–15.5)
WBC: 6.9 10*3/uL (ref 4.0–10.5)

## 2018-03-07 LAB — BASIC METABOLIC PANEL
Anion gap: 8 (ref 5–15)
BUN: 26 mg/dL — AB (ref 6–20)
CALCIUM: 8.9 mg/dL (ref 8.9–10.3)
CHLORIDE: 96 mmol/L — AB (ref 101–111)
CO2: 30 mmol/L (ref 22–32)
CREATININE: 1.16 mg/dL (ref 0.61–1.24)
GFR calc Af Amer: >60 mL/min (ref >60)
GFR calc non Af Amer: 58 mL/min — ABNORMAL LOW (ref >60)
GLUCOSE: 124 mg/dL — AB (ref 65–99)
Potassium: 4.9 mmol/L (ref 3.5–5.1)
Sodium: 134 mmol/L — ABNORMAL LOW (ref 135–145)

## 2018-03-07 NOTE — ED Triage Notes (Signed)
Pt c/o nosebleed from right nare that occurred this morning and lasted for 1 hour. Pt reports he has nosebleeds about everyday but is usually able to stop them. Pt's nose stopped bleeding while coming to the hospital. Pt is on Eliquis.

## 2018-03-07 NOTE — ED Provider Notes (Signed)
[facility] EMERGENCY DEPARTMENT Provider Note   CSN: [id number] Arrival date & time: 03/07/18  0845     History   Chief Complaint Chief Complaint  Patient presents with  . Epistaxis    HPI Edward [name] is a 83 y.o. male.  HPI  Pt was seen at 0900. Per pt and his family, c/o gradual onset and resolution of several intermittent episodes of epistaxis for the past 2 to 3 weeks. Pt states the nosebleeds are usually from his right nares. Pt states he wipes his nose with cold water to stop the bleeding. Pt states the nosebleed this morning stopped on the way to the hospital. Pt wears home O2 chronically.  Denies any other areas of bleeding. Denies any other complaints.     Past Medical History:  Diagnosis Date  . Allergy    Rhinitis  . Bronchitis   . Chronic respiratory failure ([place])   . Colon polyps   . COPD (chronic obstructive pulmonary disease) ([place])   . Diabetes mellitus   . Elevated lipids   . Hypercholesterolemia   . Hypertension   . Noncompliance   . On home O2    2L N/C   . PSA elevation   . Pulmonary fibrosis ([place])   . Vitamin D deficiency     Patient Active Problem List   Diagnosis Date Noted  . Microcytic anemia 02/17/2018  . Bradycardia 02/17/2018  . Hypothermia   . Gastroesophageal reflux disease   . Acute encephalopathy 09/25/2017  . On home O2 09/25/2017  . Insomnia 03/28/2017  . CKD (chronic kidney disease), stage III ([place]) 03/07/2017  . AF (paroxysmal atrial fibrillation) ([place]) 03/05/2017  . Chronic diastolic CHF (congestive heart failure) ([place]) 03/04/2017  . HCAP (healthcare-associated pneumonia) 03/04/2017  . Chronic respiratory failure ([place]) 03/04/2017  . Sepsis due to pneumonia ([place]) 02/25/2017  . Constipation 02/25/2017  . Overflow diarrhea/Constipation 02/25/2017  . Non compliance w medication regimen 12/02/2015  . Hypercholesterolemia 05/12/2014  . Elevated PSA 01/20/2013  . Diabetes mellitus type 2, uncontrolled ([place])   . COPD  (chronic obstructive pulmonary disease) ([place])   . Hypertension   . Elevated lipids   . Pulmonary fibrosis ([place])   . Colon polyps   . Colon polyps     Past Surgical History:  Procedure Laterality Date  . CATARACT EXTRACTION W/PHACO  06/25/2012   Procedure: CATARACT EXTRACTION PHACO AND INTRAOCULAR LENS PLACEMENT (IOC);  Surgeon: Elta Guadeloupe T. Gershon Crane, MD;  Location: [facility];  Service: Ophthalmology;  Laterality: Left;  CDE=19.01  . CATARACT EXTRACTION W/PHACO  07/09/2012   Procedure: CATARACT EXTRACTION PHACO AND INTRAOCULAR LENS PLACEMENT (IOC);  Surgeon: Elta Guadeloupe T. Gershon Crane, MD;  Location: [facility];  Service: Ophthalmology;  Laterality: Right;  CDE: 20.09        Home Medications    Prior to Admission medications   Medication Sig Start Date End Date Taking? Authorizing Provider  albuterol (PROVENTIL) (2.5 MG/3ML) 0.083% nebulizer solution INHALE 1 VIAL VIA NEBULIZER EVERY 6 HOURS AS NEEDED FOR WHEEZING OR SHORTNESS OF BREATH 07/19/17   Orlena Sheldon, PA-C  apixaban (ELIQUIS) 2.5 MG TABS tablet Take 1 tablet (2.5 mg total) by mouth 2 (two) times daily. 02/05/18   Orlena Sheldon, PA-C  aspirin EC 81 MG tablet Take 81 mg by mouth daily.    [provider]  budesonide (PULMICORT) 0.5 MG/2ML nebulizer solution Take 2 mLs (0.5 mg total) by nebulization 2 (two) times daily. 02/05/18   Dena Billet B, PA-C  cloNIDine (  CATAPRES) 0.1 MG tablet TAKE 1 TABLET BY MOUTH 2 TIMES A DAY 01/28/18   Dena Billet B, PA-C  diltiazem (CARDIZEM CD) 240 MG 24 hr capsule TAKE 1 CAPSULE BY MOUTH DAILY 12/03/17   Orlena Sheldon, PA-C  furosemide (LASIX) 40 MG tablet Take 1 tablet (40 mg total) by mouth every other day. 02/28/18 02/28/19  Dena Billet B, PA-C  insulin detemir (LEVEMIR) 100 unit/ml SOLN Inject 0.2 mLs (20 Units total) into the skin 2 (two) times daily. 02/20/18   Purohit, [name], MD  insulin lispro (HUMALOG) 100 UNIT/ML KwikPen Junior Inject 0.08 mLs (8 Units total) into the skin 3 (three) times daily. 02/20/18    Purohit, [name], MD  ipratropium (ATROVENT) 0.02 % nebulizer solution Take 2.5 mLs (0.5 mg total) by nebulization 4 (four) times daily. 02/05/18   Dena Billet B, PA-C  losartan (COZAAR) 100 MG tablet TAKE 1 TABLET BY MOUTH DAILY 02/22/18   Dena Billet B, PA-C  metoprolol tartrate (LOPRESSOR) 25 MG tablet TAKE 1 TABLET BY MOUTH TWICE DAILY 02/22/18   Orlena Sheldon, PA-C  omeprazole (PRILOSEC) 20 MG capsule Take 1 capsule (20 mg total) by mouth daily. 02/04/18   Barton Dubois, MD  [facility] DELICA LANCETS 57Q MISC USE TO CHECK BLOOD SUGAR TWICE DAILY AS DIRECTED 02/22/18   Dena Billet B, PA-C  OXYGEN Inhale 3 L into the lungs daily.     [provider]  potassium chloride SA (K-DUR,KLOR-CON) 20 MEQ tablet Take 1 tablet (20 mEq total) by mouth every other day. When he takes lasix 02/28/18   Dena Billet B, PA-C  pravastatin (PRAVACHOL) 80 MG tablet Take 1 tablet (80 mg total) by mouth at bedtime. 02/05/18   Orlena Sheldon, PA-C    Family History Family History  Problem Relation Age of Onset  . Heart disease Mother   . CAD Other   . Diabetes Other     Social History Social History   Tobacco Use  . Smoking status: Former Smoker    Packs/day: 1.50    Years: 60.00    Pack years: 90.00    Types: Cigarettes    Last attempt to quit: 12/31/2012    Years since quitting: 5.1  . Smokeless tobacco: Never Used  Substance Use Topics  . Alcohol use: No  . Drug use: No     Allergies   Ace inhibitors   Review of Systems Review of Systems ROS: Statement: All systems negative except as marked or noted in the HPI; Constitutional: Negative for fever and chills. ; ; Eyes: Negative for eye pain, redness and discharge. ; ; ENMT: +nosebleeds. Negative for ear pain, hoarseness, nasal congestion, sinus pressure and sore throat. ; ; Cardiovascular: Negative for chest pain, palpitations, diaphoresis, dyspnea and peripheral edema. ; ; Respiratory: Negative for cough, wheezing and stridor. ; ; Gastrointestinal:  Negative for nausea, vomiting, diarrhea, abdominal pain, blood in stool, hematemesis, jaundice and rectal bleeding. . ; ; Genitourinary: Negative for dysuria, flank pain and hematuria. ; ; Musculoskeletal: Negative for back pain and neck pain. Negative for swelling and trauma.; ; Skin: Negative for pruritus, rash, abrasions, blisters, bruising and skin lesion.; ; Neuro: Negative for headache, lightheadedness and neck stiffness. Negative for weakness, altered level of consciousness, altered mental status, extremity weakness, paresthesias, involuntary movement, seizure and syncope.       Physical Exam Updated Vital Signs BP (!) 130/56   Pulse 71   Temp 98.3 F (36.8 C) (Oral)   Resp 20  Ht 5\' 5"  (1.651 m)   Wt 81.6 kg (180 lb)   SpO2 98%   BMI 29.95 kg/m   Physical Exam 0905: Physical examination:  Nursing notes reviewed; Vital signs and O2 SAT reviewed;  Constitutional: Well developed, Well nourished, Well hydrated, In no acute distress; Head:  Normocephalic, atraumatic; Eyes: EOMI, PERRL, No scleral icterus; ENMT: O2 N/C prongs in bilat nares, right has small amount of dried blood on it. No active epistaxis. Dried blood just inside right nares. No clots noted in nose. No blood in posterior pharynx. Mouth and pharynx normal, Mucous membranes moist; Neck: Supple, Full range of motion, No lymphadenopathy; Cardiovascular: Regular rate and rhythm, No gallop; Respiratory: Breath sounds clear & equal bilaterally, No wheezes.  Speaking full sentences with ease, Normal respiratory effort/excursion; Chest: Nontender, Movement normal; Abdomen: Soft, Nontender, Nondistended, Normal bowel sounds; Genitourinary: No CVA tenderness; Extremities: Peripheral pulses normal, No tenderness, No edema, No calf edema or asymmetry.; Neuro: AA&Ox3, Major CN grossly intact.  Speech clear. No gross focal motor or sensory deficits in extremities.; Skin: Color normal, Warm, Dry.    ED Treatments / Results  Labs (all  labs ordered are listed, but only abnormal results are displayed)   EKG None  Radiology   Procedures Procedures (including critical care time)  Medications Ordered in ED Medications - No data to display   Initial Impression / Assessment and Plan / ED Course  I have reviewed the triage vital signs and the nursing notes.  Pertinent labs & imaging results that were available during my care of the patient were reviewed by me and considered in my medical decision making (see chart for details).  MDM Reviewed: previous chart, nursing note and vitals Reviewed previous: labs Interpretation: labs   Results for orders placed or performed during the hospital encounter of 03/07/18  CBC with Differential  Result Value Ref Range   WBC 6.9 4.0 - 10.5 K/uL   RBC 3.52 (L) 4.22 - 5.81 MIL/uL   Hemoglobin 8.2 (L) 13.0 - 17.0 g/dL   HCT 26.7 (L) 39.0 - 52.0 %   MCV 75.9 (L) 78.0 - 100.0 fL   MCH 23.3 (L) 26.0 - 34.0 pg   MCHC 30.7 30.0 - 36.0 g/dL   RDW 18.4 (H) 11.5 - 15.5 %   Platelets 360 150 - 400 K/uL   Neutrophils Relative % 66 %   Neutro Abs 4.6 1.7 - 7.7 K/uL   Lymphocytes Relative 20 %   Lymphs Abs 1.4 0.7 - 4.0 K/uL   Monocytes Relative 10 %   Monocytes Absolute 0.7 0.1 - 1.0 K/uL   Eosinophils Relative 3 %   Eosinophils Absolute 0.2 0.0 - 0.7 K/uL   Basophils Relative 1 %   Basophils Absolute 0.0 0.0 - 0.1 K/uL  Basic metabolic panel  Result Value Ref Range   Sodium 134 (L) 135 - 145 mmol/L   Potassium 4.9 3.5 - 5.1 mmol/L   Chloride 96 (L) 101 - 111 mmol/L   CO2 30 22 - 32 mmol/L   Glucose, Bld 124 (H) 65 - 99 mg/dL   BUN 26 (H) 6 - 20 mg/dL   Creatinine, Ser 1.16 0.61 - 1.24 mg/dL   Calcium 8.9 8.9 - 10.3 mg/dL   GFR calc non Af Amer 58 (L) >60 mL/min   GFR calc Af Amer >60 >60 mL/min   Anion gap 8 5 - 15    1040:  Labs per baseline. No further epistaxis while in the ED. Long d/w  pt and family by myself and ED RN regarding proper home tx for nosebleed, as well  as using surgical gel in nose to assist with dryness due to O2. Pt and family verb understanding and states they are ready to go home now. Dx and testing d/w pt and family.  Questions answered.  Verb understanding, agreeable to d/c home with outpt f/u.    Final Clinical Impressions(s) / ED Diagnoses   Final diagnoses:  None    ED Discharge Orders    None       Francine Graven, DO 03/11/18 2252

## 2018-03-07 NOTE — Discharge Instructions (Signed)
Take your usual prescriptions as previously directed.  Blow your nose gently. Using oxygen can be drying to your nasal tissues. Use over the counter normal saline nasal spray, as directed on packaging, at least twice a day to moisten your nose for the next few weeks. Use a small amount of the water soluble surgical gel, as instructed in the Emergency Department, especially at night, to also assist with moistening your nose.  If you have a nosebleed:  Lean your head forward and pinch both of your nostrils firmly and continuously for at least 20 minutes.  Call your regular medical doctor today to schedule a follow up appointment in the next 3 days.  Return to the Emergency Department immediately sooner if worsening.

## 2018-03-07 NOTE — ED Notes (Signed)
Pt says he uses Vaseline when his nose gets sore.  Informed not to use Vaseline but surgical gel.  Pt given several packs of gel.

## 2018-03-12 DIAGNOSIS — I4891 Unspecified atrial fibrillation: Secondary | ICD-10-CM | POA: Diagnosis not present

## 2018-03-12 DIAGNOSIS — J441 Chronic obstructive pulmonary disease with (acute) exacerbation: Secondary | ICD-10-CM | POA: Diagnosis not present

## 2018-03-12 DIAGNOSIS — E785 Hyperlipidemia, unspecified: Secondary | ICD-10-CM | POA: Diagnosis not present

## 2018-03-12 DIAGNOSIS — N183 Chronic kidney disease, stage 3 (moderate): Secondary | ICD-10-CM | POA: Diagnosis not present

## 2018-03-12 DIAGNOSIS — Z7951 Long term (current) use of inhaled steroids: Secondary | ICD-10-CM | POA: Diagnosis not present

## 2018-03-12 DIAGNOSIS — J9611 Chronic respiratory failure with hypoxia: Secondary | ICD-10-CM | POA: Diagnosis not present

## 2018-03-12 DIAGNOSIS — J189 Pneumonia, unspecified organism: Secondary | ICD-10-CM | POA: Diagnosis not present

## 2018-03-12 DIAGNOSIS — E559 Vitamin D deficiency, unspecified: Secondary | ICD-10-CM | POA: Diagnosis not present

## 2018-03-12 DIAGNOSIS — Z7901 Long term (current) use of anticoagulants: Secondary | ICD-10-CM | POA: Diagnosis not present

## 2018-03-12 DIAGNOSIS — E1122 Type 2 diabetes mellitus with diabetic chronic kidney disease: Secondary | ICD-10-CM | POA: Diagnosis not present

## 2018-03-12 DIAGNOSIS — I5032 Chronic diastolic (congestive) heart failure: Secondary | ICD-10-CM | POA: Diagnosis not present

## 2018-03-12 DIAGNOSIS — J8410 Pulmonary fibrosis, unspecified: Secondary | ICD-10-CM | POA: Diagnosis not present

## 2018-03-12 DIAGNOSIS — K219 Gastro-esophageal reflux disease without esophagitis: Secondary | ICD-10-CM | POA: Diagnosis not present

## 2018-03-12 DIAGNOSIS — I130 Hypertensive heart and chronic kidney disease with heart failure and stage 1 through stage 4 chronic kidney disease, or unspecified chronic kidney disease: Secondary | ICD-10-CM | POA: Diagnosis not present

## 2018-03-12 DIAGNOSIS — J440 Chronic obstructive pulmonary disease with acute lower respiratory infection: Secondary | ICD-10-CM | POA: Diagnosis not present

## 2018-03-12 DIAGNOSIS — E1165 Type 2 diabetes mellitus with hyperglycemia: Secondary | ICD-10-CM | POA: Diagnosis not present

## 2018-03-12 DIAGNOSIS — Z794 Long term (current) use of insulin: Secondary | ICD-10-CM | POA: Diagnosis not present

## 2018-03-12 DIAGNOSIS — Z9981 Dependence on supplemental oxygen: Secondary | ICD-10-CM | POA: Diagnosis not present

## 2018-03-12 DIAGNOSIS — Z7982 Long term (current) use of aspirin: Secondary | ICD-10-CM | POA: Diagnosis not present

## 2018-03-14 ENCOUNTER — Ambulatory Visit: Payer: Medicare Other | Admitting: Physician Assistant

## 2018-03-14 ENCOUNTER — Encounter: Payer: Self-pay | Admitting: Physician Assistant

## 2018-03-14 ENCOUNTER — Ambulatory Visit (INDEPENDENT_AMBULATORY_CARE_PROVIDER_SITE_OTHER): Payer: Medicare Other | Admitting: Physician Assistant

## 2018-03-14 VITALS — BP 142/60 | HR 72 | Temp 97.8°F | Resp 22 | Ht 65.0 in | Wt 193.4 lb

## 2018-03-14 DIAGNOSIS — Z9114 Patient's other noncompliance with medication regimen: Secondary | ICD-10-CM

## 2018-03-14 DIAGNOSIS — E1165 Type 2 diabetes mellitus with hyperglycemia: Secondary | ICD-10-CM

## 2018-03-14 DIAGNOSIS — I5032 Chronic diastolic (congestive) heart failure: Secondary | ICD-10-CM

## 2018-03-14 NOTE — Progress Notes (Signed)
Patient ID: Edward [name] MRN: [id number], DOB: 1935/05/18, 83 y.o. Date of Encounter: 03/14/2018, 3:58 PM    Chief Complaint:  Chief Complaint  Patient presents with  . diabetes follow up  .      HPI: 83 y.o. year old male presents for f/u of diabetes and other chronic medical problems.   His most recent hospitalization was secondary to hypoglycemia secondary to excess insulin use.  Today he brings in a BS log form with numbers written down but this paper says across top: "blood sugar log for Sept and Oct 2018" and along the left side of paper has dates from Sept and Oct.  This has been a repetitive problem. I am quite certain he is illiterate even though he will not tell me this/ admit this.   As well, it has been a repetitive problem of me marking certain meds on his AVS and discussing meds but then he does not take meds as directed once he is home.  [facility], pharmacies has been involved repeatedly with same issues over and over.   He reports that he is "taking all medicines like I told him to"       Home Meds:   Outpatient Medications Prior to Visit  Medication Sig Dispense Refill  . albuterol (PROVENTIL) (2.5 MG/3ML) 0.083% nebulizer solution INHALE 1 VIAL VIA NEBULIZER EVERY 6 HOURS AS NEEDED FOR WHEEZING OR SHORTNESS OF BREATH 360 mL 5  . apixaban (ELIQUIS) 2.5 MG TABS tablet Take 1 tablet (2.5 mg total) by mouth 2 (two) times daily. 60 tablet 2  . aspirin EC 81 MG tablet Take 81 mg by mouth daily.    . budesonide (PULMICORT) 0.5 MG/2ML nebulizer solution Take 2 mLs (0.5 mg total) by nebulization 2 (two) times daily. 120 mL 5  . cloNIDine (CATAPRES) 0.1 MG tablet TAKE 1 TABLET BY MOUTH 2 TIMES A DAY 60 tablet 2  . diltiazem (CARDIZEM CD) 240 MG 24 hr capsule TAKE 1 CAPSULE BY MOUTH DAILY 30 capsule 2  . furosemide (LASIX) 40 MG tablet Take 1 tablet (40 mg total) by mouth every other day. 90 tablet 0  . insulin detemir (LEVEMIR) 100 unit/ml SOLN Inject 0.2 mLs (20 Units  total) into the skin 2 (two) times daily. 10 mL 3  . insulin lispro (HUMALOG) 100 UNIT/ML KwikPen Junior Inject 0.08 mLs (8 Units total) into the skin 3 (three) times daily. 3 mL 3  . ipratropium (ATROVENT) 0.02 % nebulizer solution Take 2.5 mLs (0.5 mg total) by nebulization 4 (four) times daily. 25 mL 12  . losartan (COZAAR) 100 MG tablet TAKE 1 TABLET BY MOUTH DAILY 30 tablet 0  . metoprolol tartrate (LOPRESSOR) 25 MG tablet TAKE 1 TABLET BY MOUTH TWICE DAILY 60 tablet 0  . omeprazole (PRILOSEC) 20 MG capsule Take 1 capsule (20 mg total) by mouth daily. 30 capsule 3  . ONETOUCH DELICA LANCETS 33A MISC USE TO CHECK BLOOD SUGAR TWICE DAILY AS DIRECTED 100 each 2  . OXYGEN Inhale 3 L into the lungs daily.     . potassium chloride SA (K-DUR,KLOR-CON) 20 MEQ tablet Take 1 tablet (20 mEq total) by mouth every other day. When he takes lasix 90 tablet 0  . pravastatin (PRAVACHOL) 80 MG tablet Take 1 tablet (80 mg total) by mouth at bedtime. 30 tablet 3   No facility-administered medications prior to visit.     Allergies:  Allergies  Allergen Reactions  . Ace Inhibitors Other (See Comments)  Hyperkalemia--07/23/2013:patient states not familiar with the following allergy      Review of Systems: See HPI for pertinent ROS. All other ROS negative.    Physical Exam: Blood pressure (!) 142/60, pulse 72, temperature 97.8 F (36.6 C), temperature source Oral, resp. rate (!) 22, height 5\' 5"  (1.651 m), weight 87.7 kg (193 lb 6.4 oz), SpO2 98 %., Body mass index is 32.18 kg/m. General:  Appears in no acute distress. Neck: Supple. No thyromegaly. No lymphadenopathy. Lungs: Distant, decreased BS throughout bilaterally. Heart: Regular rhythm. No murmurs, rubs, or gallops. Abdomen: His abdomen in protuberant.  No hepatomegaly. No rebound/guarding. No obvious abdominal masses. Msk:  Strength and tone normal for age. Extremities/Skin: 1+ LE edema bilaterally. Neuro: Alert and oriented X 3. Moves all  extremities spontaneously. Gait is normal. CNII-XII grossly in tact. Psych:  Responds to questions appropriately with a normal affect.     ASSESSMENT AND PLAN:  83 y.o. year old male with   1. Uncontrolled type 2 diabetes mellitus with hyperglycemia ([name]) Repeated noncompliance and illiteracy problematic in adjusting medications including Insulin.   2. Chronic diastolic CHF (congestive heart failure) ([name]) Currently stable. Cont current meds  3. Non compliance w medication regimen Repeated noncompliance and illiteracy problematic   Signed, Olean Ree [place], [place], [facility] 03/14/2018 3:58 PM

## 2018-03-20 ENCOUNTER — Encounter: Payer: Self-pay | Admitting: Physician Assistant

## 2018-03-20 ENCOUNTER — Ambulatory Visit (INDEPENDENT_AMBULATORY_CARE_PROVIDER_SITE_OTHER): Payer: Medicare Other | Admitting: Physician Assistant

## 2018-03-20 VITALS — BP 140/58 | HR 65 | Temp 98.0°F | Resp 18 | Ht 65.0 in | Wt 192.6 lb

## 2018-03-20 DIAGNOSIS — J441 Chronic obstructive pulmonary disease with (acute) exacerbation: Secondary | ICD-10-CM | POA: Diagnosis not present

## 2018-03-20 MED ORDER — PREDNISONE 20 MG PO TABS: ORAL_TABLET | ORAL | 0 refills | Status: DC

## 2018-03-20 NOTE — Progress Notes (Signed)
Patient ID: Edward [name] MRN: [id number], DOB: Aug 07, 1935, 83 y.o. Date of Encounter: 03/20/2018, 8:33 AM    Chief Complaint:  Chief Complaint  Patient presents with  . discuss diabetes     HPI: 83 y.o. year old male presents for f/u OV.   He had a routine office visit with me last week.  At that time I had planned for him to return in 3 months.  He states that he got a message from our office that he had appointment today so came in for that visit today.  Apparently  3 months ROV had already been scheduled prior to recent hospitalizations etc. Then the schedule of OVs got readjusted but this Ov was never cancelled.  Today he does report that he has been having increased cough and wheezing for the last several days especially at night.  I asked if he is using his breathing medications.  He says that "yeah, I am using those little tubes that you'll sent me ".  Also noted that when he came in today he did not have his nasal cannula oxygen and that nurse got our office oxygen and applied while he is here for visit.  He has no other specific concerns to address today.     Home Meds:   Outpatient Medications Prior to Visit  Medication Sig Dispense Refill  . albuterol (PROVENTIL) (2.5 MG/3ML) 0.083% nebulizer solution INHALE 1 VIAL VIA NEBULIZER EVERY 6 HOURS AS NEEDED FOR WHEEZING OR SHORTNESS OF BREATH 360 mL 5  . apixaban (ELIQUIS) 2.5 MG TABS tablet Take 1 tablet (2.5 mg total) by mouth 2 (two) times daily. 60 tablet 2  . aspirin EC 81 MG tablet Take 81 mg by mouth daily.    . budesonide (PULMICORT) 0.5 MG/2ML nebulizer solution Take 2 mLs (0.5 mg total) by nebulization 2 (two) times daily. 120 mL 5  . cloNIDine (CATAPRES) 0.1 MG tablet TAKE 1 TABLET BY MOUTH 2 TIMES A DAY 60 tablet 2  . diltiazem (CARDIZEM CD) 240 MG 24 hr capsule TAKE 1 CAPSULE BY MOUTH DAILY 30 capsule 2  . furosemide (LASIX) 40 MG tablet Take 1 tablet (40 mg total) by mouth every other day. 90 tablet 0  .  insulin detemir (LEVEMIR) 100 unit/ml SOLN Inject 0.2 mLs (20 Units total) into the skin 2 (two) times daily. 10 mL 3  . insulin lispro (HUMALOG) 100 UNIT/ML KwikPen Junior Inject 0.08 mLs (8 Units total) into the skin 3 (three) times daily. 3 mL 3  . ipratropium (ATROVENT) 0.02 % nebulizer solution Take 2.5 mLs (0.5 mg total) by nebulization 4 (four) times daily. 25 mL 12  . losartan (COZAAR) 100 MG tablet TAKE 1 TABLET BY MOUTH DAILY 30 tablet 0  . metoprolol tartrate (LOPRESSOR) 25 MG tablet TAKE 1 TABLET BY MOUTH TWICE DAILY 60 tablet 0  . omeprazole (PRILOSEC) 20 MG capsule Take 1 capsule (20 mg total) by mouth daily. 30 capsule 3  . ONETOUCH DELICA LANCETS 93X MISC USE TO CHECK BLOOD SUGAR TWICE DAILY AS DIRECTED 100 each 2  . OXYGEN Inhale 3 L into the lungs daily.     . potassium chloride SA (K-DUR,KLOR-CON) 20 MEQ tablet Take 1 tablet (20 mEq total) by mouth every other day. When he takes lasix 90 tablet 0  . pravastatin (PRAVACHOL) 80 MG tablet Take 1 tablet (80 mg total) by mouth at bedtime. 30 tablet 3   No facility-administered medications prior to visit.     Allergies:  Allergies  Allergen Reactions  . Ace Inhibitors Other (See Comments)    Hyperkalemia--07/23/2013:patient states not familiar with the following allergy      Review of Systems: See HPI for pertinent ROS. All other ROS negative.    Physical Exam: Blood pressure (!) 140/58, pulse 65, temperature 98 F (36.7 C), temperature source Oral, resp. rate 18, height 5\' 5"  (1.651 m), weight 87.4 kg (192 lb 9.6 oz), SpO2 (!) 89 %., Body mass index is 32.05 kg/m. General:  WM. Appears in no acute distress. Neck: Supple. No thyromegaly. No lymphadenopathy. Lungs: Mild wheezes scattered throughout bilaterally.  Distant breath sounds throughout. Heart: Regular rhythm. No murmurs, rubs, or gallops. Abdomen: Soft, non-tender, non-distended with normoactive bowel sounds. No hepatomegaly. No rebound/guarding. No obvious  abdominal masses. Msk:  Strength and tone normal for age. Extremities/Skin: Warm and dry. Trace LE edema. Neuro: Alert and oriented X 3. Moves all extremities spontaneously. Gait is normal. CNII-XII grossly in tact. Psych:  Responds to questions appropriately with a normal affect.     ASSESSMENT AND PLAN:  83 y.o. year old male with  1. COPD exacerbation ([place]) We will add prednisone 40 mg daily for 2 days then 20 mg daily for 3 days. He is to continue his current pulmonary medications the same. Follow up if breathing worsens. - predniSONE (DELTASONE) 20 MG tablet; Take 2 daily for 2 days then take 1 daily for 3 days.  Dispense: 7 tablet; Refill: 0  Follow-up if breathing worsens or other concerns arise.  Otherwise plan for routine office visit follow-up 3 months.  [address] [place], [place], [facility] 03/20/2018 8:33 AM

## 2018-03-25 ENCOUNTER — Other Ambulatory Visit: Payer: Self-pay

## 2018-03-25 ENCOUNTER — Ambulatory Visit: Payer: Self-pay

## 2018-03-25 NOTE — Patient Outreach (Addendum)
[facility]) Care Management  03/25/2018  Jonthan BENJAMIM HARNISH 11-01-1934 [id number]  83 year old male referred to [facility] [facility] by for medication assistance.  [facility] services requested for inhalers and insulin.  PMHx includes, but not limited to, hypertension, heart failure, atrial fibrillation, COPD, pulmonary fibrosis, GERD, Type 2 diabetes mellitus, Stage III chronic kidney disease, and hypercholesterolemia.   Successful outreach attempt to Mr. and [name]. HIPAA identifiers verified.  Subjective: Mr. Norem states that he is "feeling pretty good."  He reports that his CBG today was 89 and was 140-150 yesterday.  He states that he has occasional nose bleeds if he coughs hard and that they usually aren't too difficult to stop the bleeding.  He states that he has not received any mail in regards to the Extra Help LIS application that I completed for him during my home visit.    Medication Assistance: Could not confirm Extra Help Benefits via medicare.gov website.   Website did not recognize the information he gave me.  I had his pharmacy run a test claim on a prescription and it was his usual price of $70, therefore I feel more time is needed for the application to be processed.  Plan: Outreach attempt in 2 weeks to see is he has received an Extra Help LIS letter.  [name], PharmD Clinical Pharmacist [place] [phone]

## 2018-03-29 ENCOUNTER — Other Ambulatory Visit: Payer: Self-pay | Admitting: Physician Assistant

## 2018-04-01 DIAGNOSIS — J449 Chronic obstructive pulmonary disease, unspecified: Secondary | ICD-10-CM | POA: Diagnosis not present

## 2018-04-07 ENCOUNTER — Encounter (HOSPITAL_COMMUNITY): Payer: Self-pay | Admitting: *Deleted

## 2018-04-07 ENCOUNTER — Other Ambulatory Visit: Payer: Self-pay

## 2018-04-07 ENCOUNTER — Emergency Department (HOSPITAL_COMMUNITY)
Admission: EM | Admit: 2018-04-07 | Discharge: 2018-04-07 | Disposition: A | Discharge status: Home / Self Care | Payer: Medicare Other | Attending: Emergency Medicine | Admitting: Emergency Medicine

## 2018-04-07 DIAGNOSIS — I5032 Chronic diastolic (congestive) heart failure: Secondary | ICD-10-CM | POA: Insufficient documentation

## 2018-04-07 DIAGNOSIS — R040 Epistaxis: Secondary | ICD-10-CM | POA: Diagnosis not present

## 2018-04-07 DIAGNOSIS — I130 Hypertensive heart and chronic kidney disease with heart failure and stage 1 through stage 4 chronic kidney disease, or unspecified chronic kidney disease: Secondary | ICD-10-CM | POA: Diagnosis not present

## 2018-04-07 DIAGNOSIS — Z79899 Other long term (current) drug therapy: Secondary | ICD-10-CM | POA: Diagnosis not present

## 2018-04-07 DIAGNOSIS — Z794 Long term (current) use of insulin: Secondary | ICD-10-CM | POA: Insufficient documentation

## 2018-04-07 DIAGNOSIS — Z87891 Personal history of nicotine dependence: Secondary | ICD-10-CM | POA: Diagnosis not present

## 2018-04-07 DIAGNOSIS — Z7982 Long term (current) use of aspirin: Secondary | ICD-10-CM | POA: Insufficient documentation

## 2018-04-07 DIAGNOSIS — J449 Chronic obstructive pulmonary disease, unspecified: Secondary | ICD-10-CM | POA: Insufficient documentation

## 2018-04-07 DIAGNOSIS — E7800 Pure hypercholesterolemia, unspecified: Secondary | ICD-10-CM | POA: Diagnosis not present

## 2018-04-07 DIAGNOSIS — Z7901 Long term (current) use of anticoagulants: Secondary | ICD-10-CM | POA: Insufficient documentation

## 2018-04-07 DIAGNOSIS — E1122 Type 2 diabetes mellitus with diabetic chronic kidney disease: Secondary | ICD-10-CM | POA: Insufficient documentation

## 2018-04-07 DIAGNOSIS — N183 Chronic kidney disease, stage 3 (moderate): Secondary | ICD-10-CM | POA: Insufficient documentation

## 2018-04-07 MED ORDER — PHENYLEPHRINE HCL 0.5 % NA SOLN
2.0000 [drp] | Freq: Once | NASAL | Status: AC
  Administered 2018-04-07: 2 [drp] via NASAL
  Filled 2018-04-07: qty 15

## 2018-04-07 NOTE — Discharge Instructions (Signed)
Take your usual prescriptions as previously directed.  Use over the counter normal saline nasal spray, as directed on packaging, at least twice a day for the next week. Apply a small amount of the water soluble lubrication inside your nose when you wear your oxygen.  If you have a nosebleed:  Blow your nose gently, apply to each nares 2 sprays of over the counter Afrin or Neo-Synephrine, then lean your head forward and pinch both of your nostrils firmly and continuously for at least 15 to 20 minutes.  Call your regular medical doctor and the ENT doctor on Monday to schedule a follow up appointment this week.  Return to the Emergency Department immediately sooner if worsening.

## 2018-04-07 NOTE — ED Provider Notes (Signed)
[facility] EMERGENCY DEPARTMENT Provider Note   CSN: [id number] Arrival date & time: 04/07/18  6568     History   Chief Complaint Chief Complaint  Patient presents with  . Epistaxis    HPI Edward [name] is a 83 y.o. male.  HPI Pt was seen at 0450. Per pt and his family, c/o gradual onset and persistence of waxing and waning right nares epistaxis for the past several hours.  Pt states he can get the nosebleed to stop after he holds pressure for 5 minutes.  The symptoms have been associated with no other complaints. The patient has a significant history of similar symptoms previously, recently being evaluated for this complaint and prior evals for same.     Past Medical History:  Diagnosis Date  . Allergy    Rhinitis  . Bronchitis   . Chronic respiratory failure ([place])   . Colon polyps   . COPD (chronic obstructive pulmonary disease) ([place])   . Diabetes mellitus   . Elevated lipids   . Hypercholesterolemia   . Hypertension   . Noncompliance   . On home O2    2L N/C   . PSA elevation   . Pulmonary fibrosis ([place])   . Vitamin D deficiency     Patient Active Problem List   Diagnosis Date Noted  . Microcytic anemia 02/17/2018  . Bradycardia 02/17/2018  . Hypothermia   . Gastroesophageal reflux disease   . Acute encephalopathy 09/25/2017  . On home O2 09/25/2017  . Insomnia 03/28/2017  . CKD (chronic kidney disease), stage III ([place]) 03/07/2017  . AF (paroxysmal atrial fibrillation) ([place]) 03/05/2017  . Chronic diastolic CHF (congestive heart failure) ([place]) 03/04/2017  . HCAP (healthcare-associated pneumonia) 03/04/2017  . Chronic respiratory failure ([place]) 03/04/2017  . Sepsis due to pneumonia ([place]) 02/25/2017  . Constipation 02/25/2017  . Overflow diarrhea/Constipation 02/25/2017  . Non compliance w medication regimen 12/02/2015  . Hypercholesterolemia 05/12/2014  . Elevated PSA 01/20/2013  . Diabetes mellitus type 2, uncontrolled ([place])   . COPD (chronic  obstructive pulmonary disease) ([place])   . Hypertension   . Elevated lipids   . Pulmonary fibrosis ([place])   . Colon polyps   . Colon polyps     Past Surgical History:  Procedure Laterality Date  . CATARACT EXTRACTION W/PHACO  06/25/2012   Procedure: CATARACT EXTRACTION PHACO AND INTRAOCULAR LENS PLACEMENT (IOC);  Surgeon: Elta Guadeloupe T. Gershon Crane, MD;  Location: [facility];  Service: Ophthalmology;  Laterality: Left;  CDE=19.01  . CATARACT EXTRACTION W/PHACO  07/09/2012   Procedure: CATARACT EXTRACTION PHACO AND INTRAOCULAR LENS PLACEMENT (IOC);  Surgeon: Elta Guadeloupe T. Gershon Crane, MD;  Location: [facility];  Service: Ophthalmology;  Laterality: Right;  CDE: 20.09        Home Medications    Prior to Admission medications   Medication Sig Start Date End Date Taking? Authorizing Provider  albuterol (PROVENTIL) (2.5 MG/3ML) 0.083% nebulizer solution INHALE 1 VIAL VIA NEBULIZER EVERY 6 HOURS AS NEEDED FOR WHEEZING OR SHORTNESS OF BREATH 07/19/17  Yes Orlena Sheldon, PA-C  apixaban (ELIQUIS) 2.5 MG TABS tablet Take 1 tablet (2.5 mg total) by mouth 2 (two) times daily. 02/05/18  Yes Orlena Sheldon, PA-C  aspirin EC 81 MG tablet Take 81 mg by mouth daily.   Yes [provider]  budesonide (PULMICORT) 0.5 MG/2ML nebulizer solution Take 2 mLs (0.5 mg total) by nebulization 2 (two) times daily. 02/05/18  Yes Dena Billet B, PA-C  cloNIDine (CATAPRES) 0.1 MG tablet TAKE 1  TABLET BY MOUTH 2 TIMES A DAY 01/28/18  Yes Dena Billet B, PA-C  diltiazem (CARDIZEM CD) 240 MG 24 hr capsule TAKE 1 CAPSULE BY MOUTH DAILY 04/01/18  Yes Dena Billet B, PA-C  furosemide (LASIX) 40 MG tablet Take 1 tablet (40 mg total) by mouth every other day. 02/28/18 02/28/19 Yes Dena Billet B, PA-C  insulin detemir (LEVEMIR) 100 unit/ml SOLN Inject 0.2 mLs (20 Units total) into the skin 2 (two) times daily. 02/20/18  Yes Purohit, [name], MD  insulin lispro (HUMALOG) 100 UNIT/ML KwikPen Junior Inject 0.08 mLs (8 Units total) into the skin 3 (three) times daily.  02/20/18  Yes Purohit, [name], MD  ipratropium (ATROVENT) 0.02 % nebulizer solution Take 2.5 mLs (0.5 mg total) by nebulization 4 (four) times daily. 02/05/18  Yes Dena Billet B, PA-C  losartan (COZAAR) 100 MG tablet TAKE 1 TABLET BY MOUTH DAILY 02/22/18  Yes Dena Billet B, PA-C  metoprolol tartrate (LOPRESSOR) 25 MG tablet TAKE 1 TABLET BY MOUTH TWICE DAILY 02/22/18  Yes Orlena Sheldon, PA-C  omeprazole (PRILOSEC) 20 MG capsule Take 1 capsule (20 mg total) by mouth daily. 02/04/18  Yes Barton Dubois, MD  [facility] DELICA LANCETS 81X MISC USE TO CHECK BLOOD SUGAR TWICE DAILY AS DIRECTED 02/22/18  Yes Dena Billet B, PA-C  OXYGEN Inhale 3 L into the lungs daily.    Yes [provider]  potassium chloride SA (K-DUR,KLOR-CON) 20 MEQ tablet Take 1 tablet (20 mEq total) by mouth every other day. When he takes lasix 02/28/18  Yes Dena Billet B, PA-C  pravastatin (PRAVACHOL) 80 MG tablet Take 1 tablet (80 mg total) by mouth at bedtime. 02/05/18  Yes Orlena Sheldon, PA-C  predniSONE (DELTASONE) 20 MG tablet Take 2 daily for 2 days then take 1 daily for 3 days. 03/20/18   Orlena Sheldon, PA-C    Family History Family History  Problem Relation Age of Onset  . Heart disease Mother   . CAD Other   . Diabetes Other     Social History Social History   Tobacco Use  . Smoking status: Former Smoker    Packs/day: 1.50    Years: 60.00    Pack years: 90.00    Types: Cigarettes    Last attempt to quit: 12/31/2012    Years since quitting: 5.2  . Smokeless tobacco: Never Used  Substance Use Topics  . Alcohol use: No  . Drug use: No     Allergies   Ace inhibitors   Review of Systems Review of Systems ROS: Statement: All systems negative except as marked or noted in the HPI; Constitutional: Negative for fever and chills. ; ; Eyes: Negative for eye pain, redness and discharge. ; ; ENMT: +right nare epistaxis. Negative for ear pain, hoarseness, nasal congestion, sinus pressure and sore throat. ; ;  Cardiovascular: Negative for chest pain, palpitations, diaphoresis, dyspnea and peripheral edema. ; ; Respiratory: Negative for cough, wheezing and stridor. ; ; Gastrointestinal: Negative for nausea, vomiting, diarrhea, abdominal pain, blood in stool, hematemesis, jaundice and rectal bleeding. . ; ; Genitourinary: Negative for dysuria, flank pain and hematuria. ; ; Musculoskeletal: Negative for back pain and neck pain. Negative for swelling and trauma.; ; Skin: Negative for pruritus, rash, abrasions, blisters, bruising and skin lesion.; ; Neuro: Negative for headache, lightheadedness and neck stiffness. Negative for weakness, altered level of consciousness, altered mental status, extremity weakness, paresthesias, involuntary movement, seizure and syncope.       Physical Exam  Updated Vital Signs BP (!) 155/72 (BP Location: Left Arm)   Pulse 87   Temp 98.2 F (36.8 C) (Oral)   Ht 5\' 5"  (1.651 m)   Wt 87.1 kg (192 lb)   SpO2 95%   BMI 31.95 kg/m   Physical Exam 0455: Physical examination:  Nursing notes reviewed; Vital signs and O2 SAT reviewed;  Constitutional: Well developed, Well nourished, Well hydrated, In no acute distress; Head:  Normocephalic, atraumatic; Eyes: EOMI, PERRL, No scleral icterus; ENMT: Mouth and pharynx normal, Mucous membranes moist. +blood clot right nares with small pool of blood anterior nares.; Neck: Supple, Full range of motion, No lymphadenopathy; Cardiovascular: Regular rate and rhythm, No gallop; Respiratory: Breath sounds clear & equal bilaterally, No wheezes.  Speaking full sentences with ease, Normal respiratory effort/excursion; Chest: Nontender, Movement normal; Abdomen: Soft, Nontender, Nondistended, Normal bowel sounds; Genitourinary: No CVA tenderness; Extremities: Peripheral pulses normal, No tenderness, No edema, No calf edema or asymmetry.; Neuro: AA&Ox3, Major CN grossly intact.  Speech clear. No gross focal motor or sensory deficits in extremities.; Skin:  Color normal, Warm, Dry.   ED Treatments / Results  Labs (all labs ordered are listed, but only abnormal results are displayed)   EKG None  Radiology   Procedures Procedures (including critical care time)  Medications Ordered in ED Medications  phenylephrine (NEO-SYNEPHRINE) 0.5 % nasal solution 2 drop (2 drops Each Nare Given 04/07/18 0450)     Initial Impression / Assessment and Plan / ED Course  I have reviewed the triage vital signs and the nursing notes.  Pertinent labs & imaging results that were available during my care of the patient were reviewed by me and considered in my medical decision making (see chart for details).  MDM Reviewed: previous chart, nursing note and vitals Reviewed previous: labs    0540:  Clot removed from right nare and topical neo-synephrine spray applied. No further epistaxis noted. Pt states he wants to go home now. Nosebleed treatment procedure for home explained by myself and ED RN. Dx d/w pt and family.  Questions answered.  Verb understanding, agreeable to d/c home with outpt f/u.   Final Clinical Impressions(s) / ED Diagnoses   Final diagnoses:  None    ED Discharge Orders    None       Francine Graven, DO 04/10/18 1511

## 2018-04-07 NOTE — ED Triage Notes (Signed)
Pt c/o nose bleed from right nare that has been intermittent during the night,

## 2018-04-09 ENCOUNTER — Ambulatory Visit: Payer: Self-pay

## 2018-04-09 ENCOUNTER — Other Ambulatory Visit: Payer: Self-pay

## 2018-04-09 NOTE — Patient Outreach (Signed)
[place] [facility]) Care Management  04/09/2018  Jailon NAFTULA DONAHUE 30-Apr-1935 [id number]  38year oldmalereferred to [place] Management by for medication assistance. [place] services requested for inhalers and insulin. PMHx includes, but not limited to, hypertension, heart failure, atrial fibrillation, COPD, pulmonary fibrosis, GERD, Type 2 diabetes mellitus, Stage III chronic kidney disease, and hypercholesterolemia.   Successful outreach attempt to Mr. and [name]. HIPAA identifiers verified.  Mr. Granquist was unable to read me the letter he received from medicare.gov regarding his Extra Help LIS application.  Medicare.gov website would not pull up his information that he gave me.  Patient requested that I come by his house to review the letters with him.    Plan: Home visit tomorrow at 10 am.   [name], [place] [phone]

## 2018-04-10 ENCOUNTER — Telehealth: Payer: Self-pay

## 2018-04-10 ENCOUNTER — Other Ambulatory Visit: Payer: Self-pay | Admitting: Pharmacy Technician

## 2018-04-10 ENCOUNTER — Other Ambulatory Visit: Payer: Self-pay

## 2018-04-10 ENCOUNTER — Ambulatory Visit: Payer: Self-pay

## 2018-04-10 MED ORDER — APIXABAN 2.5 MG PO TABS: 2.5000 mg | ORAL_TABLET | Freq: Two times a day (BID) | ORAL | 0 refills | Status: DC

## 2018-04-10 MED FILL — ELIQUIS 2.5 MG TABLET: 2.5 | 30 days supply | Qty: 60 | Fill #0

## 2018-04-10 NOTE — Patient Outreach (Signed)
[place] [facility]) Care Management  04/10/2018  Edward [name] [birth year] [id number]  60year oldmalereferred to [place] Management by for medication assistance. [place] services requested for inhalers and insulin. PMHx includes, but not limited to, hypertension, heart failure, atrial fibrillation, COPD, pulmonary fibrosis, GERD, Type 2 diabetes mellitus, Stage III chronic kidney disease, and hypercholesterolemia.   Successful home visit with Edward [name]. HIPAA identifiers verified.  Medication Assistance: Edward [name] are approved for 50% Extra Help LIS.  Per [employer], Edward [name] has spent $1301.38 out of pocket (OOP) on his prescriptions this year.  Edward [name] reports having difficultly affording his insulins (Levemir and Humalog), Eliquis and his Albuterol inhaler.   Based on his OOP expenditures, will begin application process for Eliquis from [employer] and Humalog and Engineer, agricultural from [place] and [employer] from [employer].  [place], does not give patient assistance to patients with Extra Help LIS.    Edward [name] reports that he has a prescription for Eliquis at his pharmacy that he cannot afford to pick up.  He states that he ran out of Eliquis about two weeks ago.  Patient states that he does not know why he takes Eliquis or why he needs two kinds of insulin.  [facility] Pharmacist counseled patient and his wife on his insulins and why he takes a rapid acting and long acting insulin, along with why he takes Eliquis (atrial fibrillation) and the importance of not stopping this medication.   They verbalized understanding.  [place] contacted PCP, Edward [name] office to see if any samples were available for Edward [name].   Spoke with Edward [name], who stated that they did not have any Eliquis samples, but that they could give him some Basaglar to use in place of his Levemir (same dose as his Levemir 20  units twice daily per Edward [name].)   Edward [name] has enough Humalog to last while we wait for application approval.    [place] manager approved a one time, one month supply of Eliquis through the [facility] due to patient's medical condition, high risk of hospitalization and serious adverse outcome if patient is not anticoagulated.  If Eliquis patient assistance is not approved, PCP will need to consider warfarin therapy for Edward [name].   [facility] Pharmacist delivered Eliquis to patient's home and obtained signatures on the applications for patient assistance.  Edward [name] son had picked up the sample Basaglar from PCP today.  Counseled Edward [name] and his son about beginning Basaglar (20 units twice daily) after he is out of Levemir.  Again reviewed rapid acting vs. long acting insulin, the signs of hypoglycemia and treatment.  Also counseled them on Eliquis. They verbalized understanding.  Edward [name] will still need to send in a statement of his wife's income along with his Explanation of Benefits (EOB) that should arrive from [facility] next week.  Provided them with a self addressed, stamped envelope to return the necessary paperwork.  They are aware that they will need to send this documentation back to [facility] CPhT, Edward [name] to complete the application process   Plan: Follow along with application process with CPhT, Edward [name].  Edward [name], PharmD Clinical Pharmacist [place] [phone]

## 2018-04-10 NOTE — Patient Outreach (Signed)
[facility]) Care Management  04/10/2018  Edward [name] [birth year] [id number]   Received patient assistance referral from [place]. Prepared patient portions for [name] to take to patient and faxed provider portions to [facility] for the following: [employer] (Ventolin HFA), Roosvelt Harps (Eliquis) and [employer] health practitioner and Humalog).  Once all documents are received, will submit completed applications and required documents to companies.  [name], [place] Management [phone]

## 2018-04-10 NOTE — Telephone Encounter (Signed)
Edward [name] with [place] called regarding patient insulin and eliquis Edward [name] states patient can not afford his insulin or eliquis it is running him$80.00 each for 1 month supply.  Edward [name] will try and get patient medication assistance and fax over the forms to be signed by the PCP. I will send over rx refill for eliquis to [facility] outpatient pharmacy as requested by [facility]. I will also leave 2 sample boxes of basaglar insulin for patient to pick up to hold him until he can he assistance with his medication

## 2018-04-11 ENCOUNTER — Other Ambulatory Visit: Payer: Self-pay

## 2018-04-11 MED ORDER — ALBUTEROL SULFATE HFA 108 (90 BASE) MCG/ACT IN AERS: 2.0000 | INHALATION_SPRAY | Freq: Four times a day (QID) | RESPIRATORY_TRACT | 2 refills | Status: DC | PRN

## 2018-04-11 NOTE — Progress Notes (Signed)
Etter Sjogren with [facility] requested a prescription refill on patient ventolin faxed over to her

## 2018-04-17 ENCOUNTER — Other Ambulatory Visit: Payer: Self-pay

## 2018-04-17 ENCOUNTER — Inpatient Hospital Stay (HOSPITAL_COMMUNITY)
Admission: EM | Admit: 2018-04-17 | Discharge: 2018-04-23 | DRG: 190 | Disposition: A | Discharge status: Home / Self Care | Payer: Medicare Other | Attending: Internal Medicine | Admitting: Internal Medicine

## 2018-04-17 ENCOUNTER — Encounter (HOSPITAL_COMMUNITY): Payer: Self-pay

## 2018-04-17 ENCOUNTER — Emergency Department (HOSPITAL_COMMUNITY): Payer: Medicare Other

## 2018-04-17 DIAGNOSIS — I480 Paroxysmal atrial fibrillation: Secondary | ICD-10-CM | POA: Diagnosis not present

## 2018-04-17 DIAGNOSIS — I509 Heart failure, unspecified: Secondary | ICD-10-CM

## 2018-04-17 DIAGNOSIS — I5032 Chronic diastolic (congestive) heart failure: Secondary | ICD-10-CM

## 2018-04-17 DIAGNOSIS — Z7952 Long term (current) use of systemic steroids: Secondary | ICD-10-CM

## 2018-04-17 DIAGNOSIS — T380X5A Adverse effect of glucocorticoids and synthetic analogues, initial encounter: Secondary | ICD-10-CM | POA: Diagnosis present

## 2018-04-17 DIAGNOSIS — K5730 Diverticulosis of large intestine without perforation or abscess without bleeding: Secondary | ICD-10-CM | POA: Diagnosis not present

## 2018-04-17 DIAGNOSIS — Z87891 Personal history of nicotine dependence: Secondary | ICD-10-CM | POA: Diagnosis not present

## 2018-04-17 DIAGNOSIS — N170 Acute kidney failure with tubular necrosis: Secondary | ICD-10-CM | POA: Diagnosis not present

## 2018-04-17 DIAGNOSIS — K219 Gastro-esophageal reflux disease without esophagitis: Secondary | ICD-10-CM

## 2018-04-17 DIAGNOSIS — E1122 Type 2 diabetes mellitus with diabetic chronic kidney disease: Secondary | ICD-10-CM | POA: Diagnosis present

## 2018-04-17 DIAGNOSIS — Z7901 Long term (current) use of anticoagulants: Secondary | ICD-10-CM | POA: Diagnosis not present

## 2018-04-17 DIAGNOSIS — K2980 Duodenitis without bleeding: Secondary | ICD-10-CM | POA: Diagnosis present

## 2018-04-17 DIAGNOSIS — D649 Anemia, unspecified: Secondary | ICD-10-CM | POA: Diagnosis not present

## 2018-04-17 DIAGNOSIS — J8410 Pulmonary fibrosis, unspecified: Secondary | ICD-10-CM | POA: Diagnosis present

## 2018-04-17 DIAGNOSIS — I130 Hypertensive heart and chronic kidney disease with heart failure and stage 1 through stage 4 chronic kidney disease, or unspecified chronic kidney disease: Secondary | ICD-10-CM | POA: Diagnosis not present

## 2018-04-17 DIAGNOSIS — J440 Chronic obstructive pulmonary disease with acute lower respiratory infection: Secondary | ICD-10-CM | POA: Diagnosis present

## 2018-04-17 DIAGNOSIS — N183 Chronic kidney disease, stage 3 unspecified: Secondary | ICD-10-CM

## 2018-04-17 DIAGNOSIS — R0603 Acute respiratory distress: Secondary | ICD-10-CM

## 2018-04-17 DIAGNOSIS — Z9981 Dependence on supplemental oxygen: Secondary | ICD-10-CM

## 2018-04-17 DIAGNOSIS — K648 Other hemorrhoids: Secondary | ICD-10-CM | POA: Diagnosis not present

## 2018-04-17 DIAGNOSIS — D509 Iron deficiency anemia, unspecified: Secondary | ICD-10-CM | POA: Diagnosis present

## 2018-04-17 DIAGNOSIS — E0865 Diabetes mellitus due to underlying condition with hyperglycemia: Secondary | ICD-10-CM | POA: Diagnosis not present

## 2018-04-17 DIAGNOSIS — Z888 Allergy status to other drugs, medicaments and biological substances status: Secondary | ICD-10-CM

## 2018-04-17 DIAGNOSIS — I10 Essential (primary) hypertension: Secondary | ICD-10-CM | POA: Diagnosis present

## 2018-04-17 DIAGNOSIS — Z7189 Other specified counseling: Secondary | ICD-10-CM

## 2018-04-17 DIAGNOSIS — J9621 Acute and chronic respiratory failure with hypoxia: Secondary | ICD-10-CM | POA: Diagnosis not present

## 2018-04-17 DIAGNOSIS — R600 Localized edema: Secondary | ICD-10-CM

## 2018-04-17 DIAGNOSIS — K222 Esophageal obstruction: Secondary | ICD-10-CM | POA: Diagnosis present

## 2018-04-17 DIAGNOSIS — J441 Chronic obstructive pulmonary disease with (acute) exacerbation: Principal | ICD-10-CM | POA: Diagnosis present

## 2018-04-17 DIAGNOSIS — Z515 Encounter for palliative care: Secondary | ICD-10-CM | POA: Diagnosis not present

## 2018-04-17 DIAGNOSIS — Z794 Long term (current) use of insulin: Secondary | ICD-10-CM

## 2018-04-17 DIAGNOSIS — E1165 Type 2 diabetes mellitus with hyperglycemia: Secondary | ICD-10-CM | POA: Diagnosis not present

## 2018-04-17 DIAGNOSIS — R0602 Shortness of breath: Secondary | ICD-10-CM | POA: Diagnosis not present

## 2018-04-17 DIAGNOSIS — K644 Residual hemorrhoidal skin tags: Secondary | ICD-10-CM | POA: Diagnosis not present

## 2018-04-17 DIAGNOSIS — Z09 Encounter for follow-up examination after completed treatment for conditions other than malignant neoplasm: Secondary | ICD-10-CM

## 2018-04-17 DIAGNOSIS — Z7951 Long term (current) use of inhaled steroids: Secondary | ICD-10-CM

## 2018-04-17 DIAGNOSIS — K2941 Chronic atrophic gastritis with bleeding: Secondary | ICD-10-CM | POA: Diagnosis not present

## 2018-04-17 DIAGNOSIS — K921 Melena: Secondary | ICD-10-CM

## 2018-04-17 DIAGNOSIS — K2970 Gastritis, unspecified, without bleeding: Secondary | ICD-10-CM | POA: Diagnosis not present

## 2018-04-17 DIAGNOSIS — I5033 Acute on chronic diastolic (congestive) heart failure: Secondary | ICD-10-CM | POA: Diagnosis not present

## 2018-04-17 DIAGNOSIS — Z7982 Long term (current) use of aspirin: Secondary | ICD-10-CM

## 2018-04-17 DIAGNOSIS — Z79899 Other long term (current) drug therapy: Secondary | ICD-10-CM

## 2018-04-17 DIAGNOSIS — Z9119 Patient's noncompliance with other medical treatment and regimen: Secondary | ICD-10-CM

## 2018-04-17 DIAGNOSIS — R05 Cough: Secondary | ICD-10-CM | POA: Diagnosis not present

## 2018-04-17 LAB — CBC WITH DIFFERENTIAL/PLATELET
BASOS ABS: 0 10*3/uL (ref 0.0–0.1)
BASOS PCT: 0 %
EOS ABS: 0.7 10*3/uL (ref 0.0–0.7)
EOS PCT: 9 %
HCT: 23.4 % — ABNORMAL LOW (ref 39.0–52.0)
Hemoglobin: 7 g/dL — ABNORMAL LOW (ref 13.0–17.0)
Lymphocytes Relative: 29 %
Lymphs Abs: 2.2 10*3/uL (ref 0.7–4.0)
MCH: 23 pg — ABNORMAL LOW (ref 26.0–34.0)
MCHC: 29.9 g/dL — ABNORMAL LOW (ref 30.0–36.0)
MCV: 77 fL — ABNORMAL LOW (ref 78.0–100.0)
Monocytes Absolute: 0.9 10*3/uL (ref 0.1–1.0)
Monocytes Relative: 12 %
Neutro Abs: 3.7 10*3/uL (ref 1.7–7.7)
Neutrophils Relative %: 50 %
PLATELETS: 390 10*3/uL (ref 150–400)
RBC: 3.04 MIL/uL — AB (ref 4.22–5.81)
RDW: 17.8 % — ABNORMAL HIGH (ref 11.5–15.5)
WBC: 7.5 10*3/uL (ref 4.0–10.5)

## 2018-04-17 LAB — COMPREHENSIVE METABOLIC PANEL
ALBUMIN: 3.8 g/dL (ref 3.5–5.0)
ALT: 12 U/L (ref 0–44)
AST: 17 U/L (ref 15–41)
Alkaline Phosphatase: 71 U/L (ref 38–126)
Anion gap: 10 (ref 5–15)
BUN: 35 mg/dL — ABNORMAL HIGH (ref 8–23)
CHLORIDE: 96 mmol/L — AB (ref 98–111)
CO2: 26 mmol/L (ref 22–32)
CREATININE: 1.31 mg/dL — AB (ref 0.61–1.24)
Calcium: 8.7 mg/dL — ABNORMAL LOW (ref 8.9–10.3)
GFR calc Af Amer: 58 mL/min — ABNORMAL LOW (ref >60)
GFR calc non Af Amer: 50 mL/min — ABNORMAL LOW (ref >60)
Glucose, Bld: 276 mg/dL — ABNORMAL HIGH (ref 70–99)
Potassium: 5.1 mmol/L (ref 3.5–5.1)
SODIUM: 132 mmol/L — AB (ref 135–145)
Total Bilirubin: 0.4 mg/dL (ref 0.3–1.2)
Total Protein: 7 g/dL (ref 6.5–8.1)

## 2018-04-17 LAB — BRAIN NATRIURETIC PEPTIDE: B NATRIURETIC PEPTIDE 5: 428 pg/mL — AB (ref 0.0–100.0)

## 2018-04-17 LAB — GLUCOSE, CAPILLARY: Glucose-Capillary: 243 mg/dL — ABNORMAL HIGH (ref 70–99)

## 2018-04-17 LAB — TROPONIN I: Troponin I: <0.03 ng/mL (ref <0.03)

## 2018-04-17 MED ORDER — ASPIRIN 81 MG PO CHEW
324.0000 mg | CHEWABLE_TABLET | Freq: Once | ORAL | Status: AC
  Administered 2018-04-17: 324 mg via ORAL
  Filled 2018-04-17: qty 4

## 2018-04-17 MED ORDER — DILTIAZEM HCL ER COATED BEADS 240 MG PO CP24
240.0000 mg | ORAL_CAPSULE | Freq: Every day | ORAL | Status: DC
  Administered 2018-04-18 – 2018-04-23 (×6): 240 mg via ORAL
  Filled 2018-04-17 (×6): qty 1

## 2018-04-17 MED ORDER — PREDNISONE 20 MG PO TABS
20.0000 mg | ORAL_TABLET | Freq: Two times a day (BID) | ORAL | Status: DC
  Administered 2018-04-17 – 2018-04-20 (×7): 20 mg via ORAL
  Filled 2018-04-17 (×7): qty 1

## 2018-04-17 MED ORDER — SODIUM CHLORIDE 0.9 % IV SOLN
250.0000 mL | INTRAVENOUS | Status: DC | PRN
Start: 2018-04-17 — End: 2018-04-23

## 2018-04-17 MED ORDER — SODIUM CHLORIDE 0.9% FLUSH
3.0000 mL | Freq: Two times a day (BID) | INTRAVENOUS | Status: DC
  Administered 2018-04-17 – 2018-04-23 (×10): 3 mL via INTRAVENOUS

## 2018-04-17 MED ORDER — BUDESONIDE 0.5 MG/2ML IN SUSP
0.5000 mg | Freq: Two times a day (BID) | RESPIRATORY_TRACT | Status: DC
  Administered 2018-04-17 – 2018-04-23 (×12): 0.5 mg via RESPIRATORY_TRACT
  Filled 2018-04-17 (×12): qty 2

## 2018-04-17 MED ORDER — SODIUM CHLORIDE 0.9% FLUSH: 3.0000 mL | INTRAVENOUS | Status: DC | PRN

## 2018-04-17 MED ORDER — ALBUTEROL SULFATE (2.5 MG/3ML) 0.083% IN NEBU
3.0000 mL | INHALATION_SOLUTION | Freq: Four times a day (QID) | RESPIRATORY_TRACT | Status: DC | PRN
  Administered 2018-04-17: 3 mL via RESPIRATORY_TRACT
  Filled 2018-04-17: qty 3

## 2018-04-17 MED ORDER — FUROSEMIDE 10 MG/ML IJ SOLN
40.0000 mg | Freq: Two times a day (BID) | INTRAMUSCULAR | Status: DC
  Administered 2018-04-17 – 2018-04-20 (×7): 40 mg via INTRAVENOUS
  Filled 2018-04-17 (×8): qty 4

## 2018-04-17 MED ORDER — BUDESONIDE 0.5 MG/2ML IN SUSP: 0.5000 mg | Freq: Two times a day (BID) | RESPIRATORY_TRACT | Status: DC

## 2018-04-17 MED ORDER — APIXABAN 2.5 MG PO TABS
2.5000 mg | ORAL_TABLET | Freq: Two times a day (BID) | ORAL | Status: DC
  Administered 2018-04-17 – 2018-04-19 (×4): 2.5 mg via ORAL
  Filled 2018-04-17 (×4): qty 1

## 2018-04-17 MED ORDER — INSULIN DETEMIR 100 UNIT/ML ~~LOC~~ SOLN
20.0000 [IU] | Freq: Two times a day (BID) | SUBCUTANEOUS | Status: DC
  Administered 2018-04-17 – 2018-04-19 (×4): 20 [IU] via SUBCUTANEOUS
  Filled 2018-04-17 (×10): qty 0.2

## 2018-04-17 MED ORDER — PRAVASTATIN SODIUM 40 MG PO TABS
80.0000 mg | ORAL_TABLET | Freq: Every day | ORAL | Status: DC
  Administered 2018-04-18 – 2018-04-22 (×5): 80 mg via ORAL
  Filled 2018-04-17: qty 1
  Filled 2018-04-17 (×5): qty 2
  Filled 2018-04-17: qty 1

## 2018-04-17 MED ORDER — METOPROLOL TARTRATE 25 MG PO TABS
25.0000 mg | ORAL_TABLET | Freq: Two times a day (BID) | ORAL | Status: DC
  Administered 2018-04-17 – 2018-04-23 (×12): 25 mg via ORAL
  Filled 2018-04-17 (×12): qty 1

## 2018-04-17 MED ORDER — POTASSIUM CHLORIDE CRYS ER 20 MEQ PO TBCR
20.0000 meq | EXTENDED_RELEASE_TABLET | ORAL | Status: DC
  Administered 2018-04-19 – 2018-04-21 (×2): 20 meq via ORAL
  Filled 2018-04-17 (×4): qty 1

## 2018-04-17 MED ORDER — FUROSEMIDE 10 MG/ML IJ SOLN
40.0000 mg | INTRAMUSCULAR | Status: AC
  Administered 2018-04-17: 40 mg via INTRAVENOUS
  Filled 2018-04-17: qty 4

## 2018-04-17 MED ORDER — ASPIRIN EC 81 MG PO TBEC
81.0000 mg | DELAYED_RELEASE_TABLET | Freq: Every day | ORAL | Status: DC
  Administered 2018-04-18 – 2018-04-19 (×2): 81 mg via ORAL
  Filled 2018-04-17 (×2): qty 1

## 2018-04-17 MED ORDER — LOSARTAN POTASSIUM 50 MG PO TABS
100.0000 mg | ORAL_TABLET | Freq: Every day | ORAL | Status: DC
  Administered 2018-04-18 – 2018-04-23 (×6): 100 mg via ORAL
  Filled 2018-04-17 (×6): qty 2

## 2018-04-17 MED ORDER — HUMALOG JUNIOR KWIKPEN 100 UNIT/ML ~~LOC~~ SOPN
8.0000 [IU] | PEN_INJECTOR | Freq: Three times a day (TID) | SUBCUTANEOUS | Status: DC
  Filled 2018-04-17: qty 3

## 2018-04-17 MED ORDER — IPRATROPIUM BROMIDE 0.02 % IN SOLN
0.5000 mg | Freq: Four times a day (QID) | RESPIRATORY_TRACT | Status: DC
  Administered 2018-04-17 – 2018-04-18 (×2): 0.5 mg via RESPIRATORY_TRACT
  Filled 2018-04-17 (×2): qty 2.5

## 2018-04-17 MED ORDER — INSULIN ASPART 100 UNIT/ML ~~LOC~~ SOLN
0.0000 [IU] | Freq: Three times a day (TID) | SUBCUTANEOUS | Status: DC
  Administered 2018-04-18: 5 [IU] via SUBCUTANEOUS

## 2018-04-17 MED ORDER — CLONIDINE HCL 0.1 MG PO TABS
0.1000 mg | ORAL_TABLET | Freq: Two times a day (BID) | ORAL | Status: DC
  Administered 2018-04-17 – 2018-04-23 (×12): 0.1 mg via ORAL
  Filled 2018-04-17 (×12): qty 1

## 2018-04-17 NOTE — H&P (Signed)
History and Physical    Edward [name]:[id number] DOB: 1935/04/29 DOA: 04/17/2018  PCP: Orlena Sheldon, PA-C  Patient coming from: Home  Chief Complaint: Shortness of breath wheezing and edema  HPI: [place] Edward [name] is a 83 y.o. male with medical history significant of advanced COPD on 3 L of oxygen at home chronically, congestive heart failure, diabetes, noncompliance comes in with over day of worsening wheezing and shortness of breath.  He also reports some mild edema in his legs also not normal.  He denies any fevers.  He denies any cough.  He does report PND and orthopnea.  Patient referred for admission for acute on chronic respiratory failure with initially O2 sats 84% on his 3 L of oxygen.  This is much improved with several nebulizer treatments in the ED.  Referred for admission for COPD exacerbation with likely mild CHF exacerbation also.  Review of Systems: As per HPI otherwise 10 point review of systems negative.   Past Medical History:  Diagnosis Date  . Allergy    Rhinitis  . Bronchitis   . Chronic respiratory failure ([place])   . Colon polyps   . COPD (chronic obstructive pulmonary disease) ([place])   . Diabetes mellitus   . Elevated lipids   . Hypercholesterolemia   . Hypertension   . Noncompliance   . On home O2    2L N/C   . PSA elevation   . Pulmonary fibrosis ([place])   . Vitamin D deficiency     Past Surgical History:  Procedure Laterality Date  . CATARACT EXTRACTION W/PHACO  06/25/2012   Procedure: CATARACT EXTRACTION PHACO AND INTRAOCULAR LENS PLACEMENT (IOC);  Surgeon: Elta Guadeloupe T. Gershon Crane, MD;  Location: [facility];  Service: Ophthalmology;  Laterality: Left;  CDE=19.01  . CATARACT EXTRACTION W/PHACO  07/09/2012   Procedure: CATARACT EXTRACTION PHACO AND INTRAOCULAR LENS PLACEMENT (IOC);  Surgeon: Elta Guadeloupe T. Gershon Crane, MD;  Location: [facility];  Service: Ophthalmology;  Laterality: Right;  CDE: 20.09     reports that he quit smoking about 5 years ago. His smoking use included  cigarettes. He has a 90.00 pack-year smoking history. He has never used smokeless tobacco. He reports that he does not drink alcohol or use drugs.  Allergies  Allergen Reactions  . Ace Inhibitors Other (See Comments)    Hyperkalemia--07/23/2013:patient states not familiar with the following allergy    Family History  Problem Relation Age of Onset  . Heart disease Mother   . CAD Other   . Diabetes Other     Prior to Admission medications   Medication Sig Start Date End Date Taking? Authorizing Provider  albuterol ([name];VENTOLIN HFA) 108 (90 Base) MCG/ACT inhaler Inhale 2 puffs into the lungs every 6 (six) hours as needed for wheezing or shortness of breath. 04/11/18   Dena Billet B, PA-C  albuterol (PROVENTIL) (2.5 MG/3ML) 0.083% nebulizer solution INHALE 1 VIAL VIA NEBULIZER EVERY 6 HOURS AS NEEDED FOR WHEEZING OR SHORTNESS OF BREATH 07/19/17   Orlena Sheldon, PA-C  apixaban (ELIQUIS) 2.5 MG TABS tablet Take 1 tablet (2.5 mg total) by mouth 2 (two) times daily. 04/10/18   Orlena Sheldon, PA-C  aspirin EC 81 MG tablet Take 81 mg by mouth daily.    [provider]  budesonide (PULMICORT) 0.5 MG/2ML nebulizer solution Take 2 mLs (0.5 mg total) by nebulization 2 (two) times daily. 02/05/18   Dena Billet B, PA-C  cloNIDine (CATAPRES) 0.1 MG tablet TAKE 1 TABLET BY MOUTH 2 TIMES  A DAY 01/28/18   Dena Billet B, PA-C  diltiazem (CARDIZEM CD) 240 MG 24 hr capsule TAKE 1 CAPSULE BY MOUTH DAILY 04/01/18   Dena Billet B, PA-C  furosemide (LASIX) 40 MG tablet Take 1 tablet (40 mg total) by mouth every other day. 02/28/18 02/28/19  Dena Billet B, PA-C  insulin detemir (LEVEMIR) 100 unit/ml SOLN Inject 0.2 mLs (20 Units total) into the skin 2 (two) times daily. 02/20/18   Purohit, [name], MD  insulin lispro (HUMALOG) 100 UNIT/ML KwikPen Junior Inject 0.08 mLs (8 Units total) into the skin 3 (three) times daily. 02/20/18   Purohit, [name], MD  ipratropium (ATROVENT) 0.02 % nebulizer solution Take  2.5 mLs (0.5 mg total) by nebulization 4 (four) times daily. 02/05/18   Dena Billet B, PA-C  losartan (COZAAR) 100 MG tablet TAKE 1 TABLET BY MOUTH DAILY 02/22/18   Dena Billet B, PA-C  metoprolol tartrate (LOPRESSOR) 25 MG tablet TAKE 1 TABLET BY MOUTH TWICE DAILY 02/22/18   Orlena Sheldon, PA-C  omeprazole (PRILOSEC) 20 MG capsule Take 1 capsule (20 mg total) by mouth daily. 02/04/18   Barton Dubois, MD  [facility] DELICA LANCETS 16X MISC USE TO CHECK BLOOD SUGAR TWICE DAILY AS DIRECTED 02/22/18   Dena Billet B, PA-C  OXYGEN Inhale 3 L into the lungs daily.     [provider]  potassium chloride SA (K-DUR,KLOR-CON) 20 MEQ tablet Take 1 tablet (20 mEq total) by mouth every other day. When he takes lasix 02/28/18   Dena Billet B, PA-C  pravastatin (PRAVACHOL) 80 MG tablet Take 1 tablet (80 mg total) by mouth at bedtime. 02/05/18   Orlena Sheldon, PA-C  predniSONE (DELTASONE) 20 MG tablet Take 2 daily for 2 days then take 1 daily for 3 days. 03/20/18   Orlena Sheldon, PA-C    Physical Exam: Vitals:   04/17/18 1756 04/17/18 1757 04/17/18 1800 04/17/18 1900  BP:  (!) 155/71 (!) 148/63 (!) 125/54  Pulse:  (!) 109 (!) 101 83  Resp:  (!) 22 (!) 23 20  Temp:  98.3 F (36.8 C)    TempSrc:  Oral    SpO2:  (!) 82% 95% 95%  Weight: 87.1 kg (192 lb)         Constitutional: NAD, calm, comfortable Vitals:   04/17/18 1756 04/17/18 1757 04/17/18 1800 04/17/18 1900  BP:  (!) 155/71 (!) 148/63 (!) 125/54  Pulse:  (!) 109 (!) 101 83  Resp:  (!) 22 (!) 23 20  Temp:  98.3 F (36.8 C)    TempSrc:  Oral    SpO2:  (!) 82% 95% 95%  Weight: 87.1 kg (192 lb)      Eyes: PERRL, lids and conjunctivae normal ENMT: Mucous membranes are moist. Posterior pharynx clear of any exudate or lesions.Normal dentition.  Neck: normal, supple, no masses, no thyromegaly Respiratory: Manish but clear to auscultation bilaterally, no wheezing, no crackles. Normal respiratory effort. No accessory muscle use.  Cardiovascular:  Regular rate and rhythm, no murmurs / rubs / gallops.  Trace extremity edema. 2+ pedal pulses. No carotid bruits.  Abdomen: no tenderness, no masses palpated. No hepatosplenomegaly. Bowel sounds positive.  Musculoskeletal: no clubbing / cyanosis. No joint deformity upper and lower extremities. Good ROM, no contractures. Normal muscle tone.  Skin: no rashes, lesions, ulcers. No induration Neurologic: CN 2-12 grossly intact. Sensation intact, DTR normal. Strength 5/5 in all 4.  Psychiatric: Normal judgment and insight. Alert and oriented x 3. Normal  mood.    Labs on Admission: I have personally reviewed following labs and imaging studies  CBC: Recent Labs  Lab 04/17/18 1758  WBC 7.5  NEUTROABS 3.7  HGB 7.0*  HCT 23.4*  MCV 77.0*  PLT 536   Basic Metabolic Panel: Recent Labs  Lab 04/17/18 1758  NA 132*  K 5.1  CL 96*  CO2 26  GLUCOSE 276*  BUN 35*  CREATININE 1.31*  CALCIUM 8.7*   GFR: Estimated Creatinine Clearance: 45.6 mL/min (A) (by C-G formula based on SCr of 1.31 mg/dL (H)). Liver Function Tests: Recent Labs  Lab 04/17/18 1758  AST 17  ALT 12  ALKPHOS 71  BILITOT 0.4  PROT 7.0  ALBUMIN 3.8   No results for input(s): LIPASE, AMYLASE in the last 168 hours. No results for input(s): AMMONIA in the last 168 hours. Coagulation Profile: No results for input(s): INR, PROTIME in the last 168 hours. Cardiac Enzymes: Recent Labs  Lab 04/17/18 1758  TROPONINI <0.03   BNP (last 3 results) No results for input(s): PROBNP in the last 8760 hours. HbA1C: No results for input(s): HGBA1C in the last 72 hours. CBG: No results for input(s): GLUCAP in the last 168 hours. Lipid Profile: No results for input(s): CHOL, HDL, LDLCALC, TRIG, CHOLHDL, LDLDIRECT in the last 72 hours. Thyroid Function Tests: No results for input(s): TSH, T4TOTAL, FREET4, T3FREE, THYROIDAB in the last 72 hours. Anemia Panel: No results for input(s): VITAMINB12, FOLATE, FERRITIN, TIBC, IRON,  RETICCTPCT in the last 72 hours. Urine analysis:    Component Value Date/Time   COLORURINE STRAW (A) 02/06/2018 1142   APPEARANCEUR CLEAR 02/06/2018 1142   LABSPEC 1.013 02/06/2018 1142   PHURINE 6.0 02/06/2018 1142   GLUCOSEU >=500 (A) 02/06/2018 1142   HGBUR SMALL (A) 02/06/2018 1142   BILIRUBINUR NEGATIVE 02/06/2018 1142   KETONESUR NEGATIVE 02/06/2018 1142   PROTEINUR NEGATIVE 02/06/2018 1142   NITRITE NEGATIVE 02/06/2018 1142   LEUKOCYTESUR NEGATIVE 02/06/2018 1142   Sepsis Labs: !!!!!!!!!!!!!!!!!!!!!!!!!!!!!!!!!!!!!!!!!!!! @LABRCNTIP (procalcitonin:4,lacticidven:4) )No results found for this or any previous visit (from the past 240 hour(s)).   Radiological Exams on Admission: Dg Chest Port 1 View  Result Date: 04/17/2018 CLINICAL DATA:  Shortness of breath and cough EXAM: PORTABLE CHEST 1 VIEW COMPARISON:  Chest radiograph 02/17/2018 FINDINGS: Unchanged cardiomediastinal contours. There is increased interstitial opacity. No pleural effusion or pneumothorax. IMPRESSION: Increased interstitial opacity, likely mild pulmonary edema. Electronically Signed   By: Ulyses Jarred M.D.   On: 04/17/2018 18:45    EKG: Independently reviewed.  Normal sinus rhythm with ST depression in lateral leads which are old compared to old EKGs  Old chart reviewed  Case discussed with Dr. Sabra Heck in the ED  Chest x-ray reviewed mild pulmonary edema no focal infiltrate  Assessment/Plan 83 year old male with acute on chronic respiratory failure secondary to COPD exacerbation Principal Problem:   COPD exacerbation ([name])-frequent nebs.  Placed on oral prednisone.  Continuous oxygen supplementation also at 3 L at baseline.  No fever normal white count.  Likely also has a component of mild CHF.  Active Problems:   Acute on chronic respiratory failure with hypoxia ([name])-seems mostly due to COPD exacerbation.  Already back to baseline.    Hypertension-stable continue meds    Chronic diastolic CHF  (congestive heart failure) ([name])-patient only on Lasix every other day will place on Lasix 40 mg IV every 12 hours for the next 24 hours or so.  Monitor renal function closely as doing so.    AF (paroxysmal  atrial fibrillation) ([name])-rate controlled in normal sinus rhythm at this time.    CKD (chronic kidney disease), stage III ([name])-creatinine 1.3.  At baseline.  Monitor closely while increasing diuretics.     DVT prophylaxis: On Eliquis Code Status: Full Family Communication: None Disposition Plan: 1 to 3 days Consults called: None Admission status: Admission   DAVID,RACHAL A MD [facility]  If 7PM-7AM, please contact night-coverage [email or web address] Password [facility]  04/17/2018, 7:24 PM

## 2018-04-17 NOTE — Progress Notes (Signed)
Placed bubble humidifier on pt's oxygen for comfort. Assist pt with safety sock placement and added extension tubing to pt's oxygen so he can go to the bathroom. As I was leaving pt's room his nose began to bleed. Pt was in ER on 7/7 with a nose bleed. Darius Bump, RN made aware and is now at bedside

## 2018-04-17 NOTE — ED Notes (Signed)
Report given to Tandra RN on 300. 

## 2018-04-17 NOTE — ED Notes (Signed)
EDP at bedside  

## 2018-04-17 NOTE — ED Provider Notes (Signed)
[facility] EMERGENCY DEPARTMENT Provider Note   CSN: [id number] Arrival date & time: 04/17/18  1751     History   Chief Complaint Chief Complaint  Patient presents with  . Shortness of Breath    HPI Edward [name] is a 83 y.o. male.  HPI  83 year old male, known history of COPD on 3 L of oxygen chronically, history of pulmonary fibrosis, hypertension and the patient reports he has a history of congestive heart failure (medical record reviewed and shows ejection fraction of 65-70% during an echocardiogram from December 2018 with grade 1 diastolic dysfunction). He is on Lasix and was supposed to be taking a medication that helped him with the tightness in his chest but he has run out. He presents today with increasing shortness of breath which is been going on for several days, gradually worsening and is now become severe. It is associated with swelling of the bilateral lower extremities which is unusual as well, he has a tightness in his chest but states he has run out of the medication that he was told to use for the tightness, he cannot remember the name of it.  No fevers, no vomiting, no diarrhea.  Using albuterol treatments at home with only temporary transient relief On Eliquis for his Afib.  Past Medical History:  Diagnosis Date  . Allergy    Rhinitis  . Bronchitis   . Chronic respiratory failure ([place])   . Colon polyps   . COPD (chronic obstructive pulmonary disease) ([place])   . Diabetes mellitus   . Elevated lipids   . Hypercholesterolemia   . Hypertension   . Noncompliance   . On home O2    2L N/C   . PSA elevation   . Pulmonary fibrosis ([place])   . Vitamin D deficiency     Patient Active Problem List   Diagnosis Date Noted  . COPD exacerbation ([place]) 04/17/2018  . Microcytic anemia 02/17/2018  . Bradycardia 02/17/2018  . Hypothermia   . Gastroesophageal reflux disease   . Acute encephalopathy 09/25/2017  . On home O2 09/25/2017  . Insomnia 03/28/2017  .  CKD (chronic kidney disease), stage III ([place]) 03/07/2017  . AF (paroxysmal atrial fibrillation) ([place]) 03/05/2017  . Chronic diastolic CHF (congestive heart failure) ([place]) 03/04/2017  . HCAP (healthcare-associated pneumonia) 03/04/2017  . Chronic respiratory failure ([place]) 03/04/2017  . Sepsis due to pneumonia ([place]) 02/25/2017  . Constipation 02/25/2017  . Overflow diarrhea/Constipation 02/25/2017  . Non compliance w medication regimen 12/02/2015  . Hypercholesterolemia 05/12/2014  . Elevated PSA 01/20/2013  . Diabetes mellitus type 2, uncontrolled ([place])   . COPD (chronic obstructive pulmonary disease) ([place])   . Hypertension   . Elevated lipids   . Pulmonary fibrosis ([place])   . Colon polyps   . Colon polyps     Past Surgical History:  Procedure Laterality Date  . CATARACT EXTRACTION W/PHACO  06/25/2012   Procedure: CATARACT EXTRACTION PHACO AND INTRAOCULAR LENS PLACEMENT (IOC);  Surgeon: Elta Guadeloupe T. Gershon Crane, MD;  Location: [facility];  Service: Ophthalmology;  Laterality: Left;  CDE=19.01  . CATARACT EXTRACTION W/PHACO  07/09/2012   Procedure: CATARACT EXTRACTION PHACO AND INTRAOCULAR LENS PLACEMENT (IOC);  Surgeon: Elta Guadeloupe T. Gershon Crane, MD;  Location: [facility];  Service: Ophthalmology;  Laterality: Right;  CDE: 20.09        Home Medications    Prior to Admission medications   Medication Sig Start Date End Date Taking? Authorizing Provider  albuterol ([name];VENTOLIN HFA) 108 (90 Base) MCG/ACT inhaler  Inhale 2 puffs into the lungs every 6 (six) hours as needed for wheezing or shortness of breath. 04/11/18   Dena Billet B, PA-C  albuterol (PROVENTIL) (2.5 MG/3ML) 0.083% nebulizer solution INHALE 1 VIAL VIA NEBULIZER EVERY 6 HOURS AS NEEDED FOR WHEEZING OR SHORTNESS OF BREATH 07/19/17   Orlena Sheldon, PA-C  apixaban (ELIQUIS) 2.5 MG TABS tablet Take 1 tablet (2.5 mg total) by mouth 2 (two) times daily. 04/10/18   Orlena Sheldon, PA-C  aspirin EC 81 MG tablet Take 81 mg by mouth daily.     [provider]  budesonide (PULMICORT) 0.5 MG/2ML nebulizer solution Take 2 mLs (0.5 mg total) by nebulization 2 (two) times daily. 02/05/18   Dena Billet B, PA-C  cloNIDine (CATAPRES) 0.1 MG tablet TAKE 1 TABLET BY MOUTH 2 TIMES A DAY 01/28/18   Dena Billet B, PA-C  diltiazem (CARDIZEM CD) 240 MG 24 hr capsule TAKE 1 CAPSULE BY MOUTH DAILY 04/01/18   Orlena Sheldon, PA-C  furosemide (LASIX) 40 MG tablet Take 1 tablet (40 mg total) by mouth every other day. 02/28/18 02/28/19  Dena Billet B, PA-C  insulin detemir (LEVEMIR) 100 unit/ml SOLN Inject 0.2 mLs (20 Units total) into the skin 2 (two) times daily. 02/20/18   Purohit, [name], MD  insulin lispro (HUMALOG) 100 UNIT/ML KwikPen Junior Inject 0.08 mLs (8 Units total) into the skin 3 (three) times daily. 02/20/18   Purohit, [name], MD  ipratropium (ATROVENT) 0.02 % nebulizer solution Take 2.5 mLs (0.5 mg total) by nebulization 4 (four) times daily. 02/05/18   Dena Billet B, PA-C  losartan (COZAAR) 100 MG tablet TAKE 1 TABLET BY MOUTH DAILY 02/22/18   Dena Billet B, PA-C  metoprolol tartrate (LOPRESSOR) 25 MG tablet TAKE 1 TABLET BY MOUTH TWICE DAILY 02/22/18   Orlena Sheldon, PA-C  omeprazole (PRILOSEC) 20 MG capsule Take 1 capsule (20 mg total) by mouth daily. 02/04/18   Barton Dubois, MD  [facility] DELICA LANCETS 63K MISC USE TO CHECK BLOOD SUGAR TWICE DAILY AS DIRECTED 02/22/18   Dena Billet B, PA-C  OXYGEN Inhale 3 L into the lungs daily.     [provider]  potassium chloride SA (K-DUR,KLOR-CON) 20 MEQ tablet Take 1 tablet (20 mEq total) by mouth every other day. When he takes lasix 02/28/18   Dena Billet B, PA-C  pravastatin (PRAVACHOL) 80 MG tablet Take 1 tablet (80 mg total) by mouth at bedtime. 02/05/18   Orlena Sheldon, PA-C  predniSONE (DELTASONE) 20 MG tablet Take 2 daily for 2 days then take 1 daily for 3 days. 03/20/18   Orlena Sheldon, PA-C    Family History Family History  Problem Relation Age of Onset  . Heart disease Mother   .  CAD Other   . Diabetes Other     Social History Social History   Tobacco Use  . Smoking status: Former Smoker    Packs/day: 1.50    Years: 60.00    Pack years: 90.00    Types: Cigarettes    Last attempt to quit: 12/31/2012    Years since quitting: 5.2  . Smokeless tobacco: Never Used  Substance Use Topics  . Alcohol use: No  . Drug use: No     Allergies   Ace inhibitors   Review of Systems Review of Systems  All other systems reviewed and are negative.    Physical Exam Updated Vital Signs BP (!) 125/54   Pulse 83   Temp  98.3 F (36.8 C) (Oral)   Resp 20   Wt 87.1 kg (192 lb)   SpO2 95%   BMI 31.95 kg/m   Physical Exam  Constitutional: He appears well-developed and well-nourished. No distress.  HENT:  Head: Normocephalic and atraumatic.  Mouth/Throat: Oropharynx is clear and moist. No oropharyngeal exudate.  Eyes: Pupils are equal, round, and reactive to light. Conjunctivae and EOM are normal. Right eye exhibits no discharge. Left eye exhibits no discharge. No scleral icterus.  Neck: Normal range of motion. Neck supple. JVD ( subtle) present. No thyromegaly present.  Cardiovascular: Normal heart sounds and intact distal pulses. Exam reveals no gallop and no friction rub.  No murmur heard. Ectopy / afib auscultated - soft murmur  Pulmonary/Chest: Tachypnea noted. He has wheezes in the right upper field, the right middle field, the right lower field, the left upper field, the left middle field and the left lower field. He has no rales.  Tachypneic, diffuse wheezing, speaks in shortened sentences  Abdominal: Soft. Bowel sounds are normal. He exhibits no distension and no mass. There is no tenderness.  Musculoskeletal: Normal range of motion. He exhibits edema. He exhibits no tenderness.       Right lower leg: He exhibits edema.       Left lower leg: He exhibits edema.  smmetrical edema  Lymphadenopathy:    He has no cervical adenopathy.  Neurological: He is  alert. Coordination normal.  Follows commands - gait slow and calculated  Skin: Skin is warm and dry. No rash noted. No erythema.  No rash seen on the legs / arms or trunk.  Psychiatric: He has a normal mood and affect. His behavior is normal.  Nursing note and vitals reviewed.    ED Treatments / Results  Labs (all labs ordered are listed, but only abnormal results are displayed) Labs Reviewed  CBC WITH DIFFERENTIAL/PLATELET - Abnormal; Notable for the following components:      Result Value   RBC 3.04 (*)    Hemoglobin 7.0 (*)    HCT 23.4 (*)    MCV 77.0 (*)    MCH 23.0 (*)    MCHC 29.9 (*)    RDW 17.8 (*)    All other components within normal limits  COMPREHENSIVE METABOLIC PANEL - Abnormal; Notable for the following components:   Sodium 132 (*)    Chloride 96 (*)    Glucose, Bld 276 (*)    BUN 35 (*)    Creatinine, Ser 1.31 (*)    Calcium 8.7 (*)    GFR calc non Af Amer 50 (*)    GFR calc Af Amer 58 (*)    All other components within normal limits  BRAIN NATRIURETIC PEPTIDE - Abnormal; Notable for the following components:   B Natriuretic Peptide 428.0 (*)    All other components within normal limits  TROPONIN I    EKG EKG Interpretation  Date/Time:  Wednesday April 17 2018 18:00:27 EDT Ventricular Rate:  97 PR Interval:    QRS Duration: 83 QT Interval:  360 QTC Calculation: 458 R Axis:   75 Text Interpretation:  Sinus rhythm Repol abnrm suggests ischemia, diffuse leads since prior tracings, no new changes seen - ST depression laterally and inferior seen on prior. Confirmed by Noemi Chapel [phone]) on 04/17/2018 6:06:56 PM   Radiology Dg Chest Port 1 View  Result Date: 04/17/2018 CLINICAL DATA:  Shortness of breath and cough EXAM: PORTABLE CHEST 1 VIEW COMPARISON:  Chest radiograph 02/17/2018 FINDINGS: Unchanged  cardiomediastinal contours. There is increased interstitial opacity. No pleural effusion or pneumothorax. IMPRESSION: Increased interstitial opacity,  likely mild pulmonary edema. Electronically Signed   By: Ulyses Jarred M.D.   On: 04/17/2018 18:45    Procedures .Critical Care Performed by: Noemi Chapel, MD Authorized by: Noemi Chapel, MD   Critical care provider statement:    Critical care time (minutes):  35   Critical care time was exclusive of:  Separately billable procedures and treating other patients and teaching time   Critical care was necessary to treat or prevent imminent or life-threatening deterioration of the following conditions:  Respiratory failure and cardiac failure   Critical care was time spent personally by me on the following activities:  Blood draw for specimens, development of treatment plan with patient or surrogate, discussions with consultants, evaluation of patient's response to treatment, examination of patient, obtaining history from patient or surrogate, ordering and performing treatments and interventions, ordering and review of laboratory studies, ordering and review of radiographic studies, pulse oximetry, re-evaluation of patient's condition and review of old charts   (including critical care time)  Medications Ordered in ED Medications  furosemide (LASIX) injection 40 mg (40 mg Intravenous Given 04/17/18 1817)  aspirin chewable tablet 324 mg (324 mg Oral Given 04/17/18 1817)     Initial Impression / Assessment and Plan / ED Course  I have reviewed the triage vital signs and the nursing notes.  Pertinent labs & imaging results that were available during my care of the patient were reviewed by me and considered in my medical decision making (see chart for details).    The exam is consistent with COPD or CHF given the wheezing edema and mild JVD. We'll perform a chest x-ray and a BNP to further separate out the cause however he does have simple very fibrosis and I suspect he always has some abnormal lung sounds. Albuterol treatment will be ordered. EKG is nonischemic and essentially unchanged though  there are signs of left ventricular hypertrophy and diffuse ST T abnormalities  Multiple nebs  Chest x-ray shows pulmonary edema, the patient improved somewhat with nebulized treatments, no signs of pneumonia  BNP elevated, severely anemic with a hemoglobin of 7 that was baseline is around a half.  Discussed with the hospitalist will admit. Lasix given in the ER.  Appreciate Dr. Shanon Brow and her willingness to help with this very nice patient  Final Clinical Impressions(s) / ED Diagnoses   Final diagnoses:  Respiratory distress  Acute on chronic congestive heart failure, unspecified heart failure type ([place])  COPD exacerbation ([place])  Severe anemia    ED Discharge Orders    None       Noemi Chapel, MD 04/17/18 1921

## 2018-04-17 NOTE — ED Triage Notes (Signed)
Pt reports increase in shortness of breath and non productive  Cough. Denies pain

## 2018-04-18 DIAGNOSIS — I5033 Acute on chronic diastolic (congestive) heart failure: Secondary | ICD-10-CM

## 2018-04-18 DIAGNOSIS — Z794 Long term (current) use of insulin: Secondary | ICD-10-CM

## 2018-04-18 DIAGNOSIS — E0865 Diabetes mellitus due to underlying condition with hyperglycemia: Secondary | ICD-10-CM

## 2018-04-18 LAB — BASIC METABOLIC PANEL
Anion gap: 7 (ref 5–15)
BUN: 34 mg/dL — AB (ref 8–23)
CALCIUM: 8.7 mg/dL — AB (ref 8.9–10.3)
CO2: 30 mmol/L (ref 22–32)
CREATININE: 1.22 mg/dL (ref 0.61–1.24)
Chloride: 95 mmol/L — ABNORMAL LOW (ref 98–111)
GFR calc non Af Amer: 54 mL/min — ABNORMAL LOW (ref >60)
Glucose, Bld: 299 mg/dL — ABNORMAL HIGH (ref 70–99)
Potassium: 5.1 mmol/L (ref 3.5–5.1)
SODIUM: 132 mmol/L — AB (ref 135–145)

## 2018-04-18 LAB — CBC
HCT: 23.4 % — ABNORMAL LOW (ref 39.0–52.0)
Hemoglobin: 7 g/dL — ABNORMAL LOW (ref 13.0–17.0)
MCH: 22.9 pg — ABNORMAL LOW (ref 26.0–34.0)
MCHC: 29.9 g/dL — ABNORMAL LOW (ref 30.0–36.0)
MCV: 76.5 fL — ABNORMAL LOW (ref 78.0–100.0)
PLATELETS: 359 10*3/uL (ref 150–400)
RBC: 3.06 MIL/uL — AB (ref 4.22–5.81)
RDW: 17.9 % — AB (ref 11.5–15.5)
WBC: 5.2 10*3/uL (ref 4.0–10.5)

## 2018-04-18 LAB — GLUCOSE, CAPILLARY
GLUCOSE-CAPILLARY: 289 mg/dL — AB (ref 70–99)
Glucose-Capillary: 278 mg/dL — ABNORMAL HIGH (ref 70–99)
Glucose-Capillary: 77 mg/dL (ref 70–99)
Glucose-Capillary: 235 mg/dL — ABNORMAL HIGH (ref 70–99)

## 2018-04-18 LAB — FERRITIN: Ferritin: 7 ng/mL — ABNORMAL LOW (ref 24–336)

## 2018-04-18 LAB — IRON AND TIBC
IRON: 20 ug/dL — AB (ref 45–182)
SATURATION RATIOS: 4 % — AB (ref 17.9–39.5)
TIBC: 490 ug/dL — AB (ref 250–450)
UIBC: 470 ug/dL

## 2018-04-18 MED ORDER — POLYETHYLENE GLYCOL 3350 17 G PO PACK
17.0000 g | PACK | Freq: Every day | ORAL | Status: DC
  Administered 2018-04-18 – 2018-04-23 (×6): 17 g via ORAL
  Filled 2018-04-18 (×6): qty 1

## 2018-04-18 MED ORDER — AZITHROMYCIN 250 MG PO TABS
500.0000 mg | ORAL_TABLET | Freq: Every day | ORAL | Status: DC
  Administered 2018-04-18 – 2018-04-22 (×5): 500 mg via ORAL
  Filled 2018-04-18 (×5): qty 2

## 2018-04-18 MED ORDER — IPRATROPIUM-ALBUTEROL 0.5-2.5 (3) MG/3ML IN SOLN
3.0000 mL | RESPIRATORY_TRACT | Status: DC
  Administered 2018-04-18 – 2018-04-20 (×11): 3 mL via RESPIRATORY_TRACT
  Filled 2018-04-18 (×10): qty 3

## 2018-04-18 MED ORDER — INSULIN ASPART 100 UNIT/ML ~~LOC~~ SOLN
0.0000 [IU] | Freq: Three times a day (TID) | SUBCUTANEOUS | Status: DC
  Administered 2018-04-18: 11 [IU] via SUBCUTANEOUS
  Administered 2018-04-19: 15 [IU] via SUBCUTANEOUS
  Administered 2018-04-19: 11 [IU] via SUBCUTANEOUS
  Administered 2018-04-19: 3 [IU] via SUBCUTANEOUS
  Administered 2018-04-20: 4 [IU] via SUBCUTANEOUS
  Administered 2018-04-20: 15 [IU] via SUBCUTANEOUS
  Administered 2018-04-21 (×2): 7 [IU] via SUBCUTANEOUS

## 2018-04-18 MED ORDER — INSULIN ASPART 100 UNIT/ML ~~LOC~~ SOLN
8.0000 [IU] | Freq: Three times a day (TID) | SUBCUTANEOUS | Status: DC
  Administered 2018-04-18 – 2018-04-19 (×5): 8 [IU] via SUBCUTANEOUS

## 2018-04-18 NOTE — Care Management Note (Signed)
Case Management Note  Patient Details  Name: Edward [name] MRN: [id number] Date of Birth: 12-03-1934  Subjective/Objective:   Admitted with COPD. Pt is from home, lives with his wife and son. This is patients 3rd admission and has had 4 ED visits in past 6 months. Pt was referred to [facility] at time of last DC, per pt they stopped coming 1-2 weeks ago. He does not feel them coming back out would benefit him. Pt has [facility] pending, there are notes from [facility] pharmacist regarding assistance with mediations. Per note pt has been non-compliant with medications d/t inability to afford them, pt says he has never not taken his medications d/t not being able to get them filled. Pt says he has transportation to appointments. He has home oxygen and neb machine.                 Action/Plan: Pt will DC home with self care. CM will reach out to [facility] to determine status with CM referral. CM discussed the [facility] program with patient and he is not interested.   Expected Discharge Date:  04/19/18               Expected Discharge Plan:  Home/Self Care  In-House Referral:  NA  Discharge planning Services  CM Consult  Post Acute Care Choice:  NA Choice offered to:  NA  Status of Service:  Completed, signed off  If discussed at Long Length of Stay Meetings, dates discussed:    Additional Comments:  Sherald Barge, RN 04/18/2018, 12:47 PM

## 2018-04-18 NOTE — Progress Notes (Signed)
PROGRESS NOTE                                                                                                                                                                                                             Patient Demographics:    Edward [name], is a 83 y.o. male, DOB - 05/26/1935, JAS:[id number]  Admit date - 04/17/2018   Admitting Physician Phillips Grout, MD  Outpatient Primary MD for the patient is [name]  LOS - 1  Outpatient Specialists: none  Chief Complaint  Patient presents with  . Shortness of Breath       Brief Narrative  83 year old male with chronic respiratory failure on 2-3 L home O2 with frequent hospitalization, chronic diastolic CHF, uncontrolled type 2 diabetes mellitus, paroxysmal A. fib on Eliquis, microcytic anemia, presented to the ED with increasing shortness of breath, wheezing and some leg swellings for past few days.  In the ED he was found to have O2 desaturation to 84% on 3 L, improved with nebulizer. Patient admitted for COPD exacerbation with acute on chronic hypoxic respiratory failure and acute on chronic diastolic CHF.   Subjective:   Patient still reports wheezing and dyspnea with minimal improvement since admission.  Assessment  & Plan :    Principal Problem: Acute on chronic respiratory failure with hypoxia ([name]) COPD with acute exacerbation Switch to scheduled DuoNeb, Pulmicort nebs twice daily.  On oral prednisone 20 mg twice daily (we will not escalate due to hyperglycemia). Maintaining O2 sat on 3 L. Add antitussives as needed Add empiric azithromycin.  Active Problems: Chronic diastolic CHF Patient was taking oral Lasix on alternate days and has been placed on 40 mg IV Lasix twice daily.  Monitor I/O.  Microcytic anemia Noted for slow dropping H&H in the past few months (hemoglobin was 9.5 few months ago and is 7 this admission).  Denies  hematochezia or melena.  He is on chronic anticoagulants . Check iron panel and stool for Hemoccult.  Reports having colonoscopy several years back.  Diabetes mellitus type 2, uncontrolled with hyperglycemia Home dose of Levemir 20 units twice daily and Humalog 8 units 3 times daily with meal coverage continued.  Switch to resistant sliding scale coverage as CBG elevated while on steroid.    AF (paroxysmal atrial fibrillation) ([name]) Rate controlled on beta-blocker.  Continue Eliquis.    CKD (chronic kidney disease), stage III ([place]) At baseline.       Code Status : Full code we will consult palliative care for goals of care discussion given recurrent hospitalization and chronic respiratory failure.  Family Communication  : None at bedside  Disposition Plan  : Home once improved possibly in the next 40-72 hours  Barriers For Discharge : Active symptoms  Consults  : Palliative care  Procedures  : None  DVT Prophylaxis  : Eliquis  Lab Results  Component Value Date   PLT 359 04/18/2018    Antibiotics  :    Anti-infectives (From admission, onward)   None        Objective:   Vitals:   04/17/18 2008 04/17/18 2113 04/18/18 0551 04/18/18 1003  BP: (!) 151/71  (!) 149/68   Pulse: 87  73   Resp: (!) 23  17   Temp: 98 F (36.7 C)  (!) 97.4 F (36.3 C)   TempSrc: Oral  Oral   SpO2: 95% 96% 100% 98%  Weight:      Height:        Wt Readings from Last 3 Encounters:  04/17/18 84.2 kg (185 lb 9.6 oz)  04/07/18 87.1 kg (192 lb)  03/20/18 87.4 kg (192 lb 9.6 oz)     Intake/Output Summary (Last 24 hours) at 04/18/2018 1049 Last data filed at 04/18/2018 0955 Gross per 24 hour  Intake 240 ml  Output 1900 ml  Net -1660 ml     Physical Exam  Gen: Elderly male appears fatigued, not in distress HEENT: Pallor present moist mucosa, supple neck Chest: Diffuse bilateral wheezing and rhonchi, no crackles CVS: S1 and S2 irregular, no murmurs GI: soft, NT, ND,  BS+ Musculoskeletal: warm, trace bilateral pitting edema     Data Review:    CBC Recent Labs  Lab 04/17/18 1758 04/18/18 0628  WBC 7.5 5.2  HGB 7.0* 7.0*  HCT 23.4* 23.4*  PLT 390 359  MCV 77.0* 76.5*  MCH 23.0* 22.9*  MCHC 29.9* 29.9*  RDW 17.8* 17.9*  LYMPHSABS 2.2  --   MONOABS 0.9  --   EOSABS 0.7  --   BASOSABS 0.0  --     Chemistries  Recent Labs  Lab 04/17/18 1758 04/18/18 0628  NA 132* 132*  K 5.1 5.1  CL 96* 95*  CO2 26 30  GLUCOSE 276* 299*  BUN 35* 34*  CREATININE 1.31* 1.22  CALCIUM 8.7* 8.7*  AST 17  --   ALT 12  --   ALKPHOS 71  --   BILITOT 0.4  --    ------------------------------------------------------------------------------------------------------------------ No results for input(s): CHOL, HDL, LDLCALC, TRIG, CHOLHDL, LDLDIRECT in the last 72 hours.  Lab Results  Component Value Date   HGBA1C 11.8 (H) 02/19/2018   ------------------------------------------------------------------------------------------------------------------ No results for input(s): TSH, T4TOTAL, T3FREE, THYROIDAB in the last 72 hours.  Invalid input(s): FREET3 ------------------------------------------------------------------------------------------------------------------ No results for input(s): VITAMINB12, FOLATE, FERRITIN, TIBC, IRON, RETICCTPCT in the last 72 hours.  Coagulation profile No results for input(s): INR, PROTIME in the last 168 hours.  No results for input(s): DDIMER in the last 72 hours.  Cardiac Enzymes Recent Labs  Lab 04/17/18 1758  TROPONINI <0.03   ------------------------------------------------------------------------------------------------------------------    Component Value Date/Time   BNP 428.0 (H) 04/17/2018 1758   BNP 121.5 (H) 03/21/2017 1513    Inpatient Medications  Scheduled Meds: . apixaban  2.5 mg Oral BID  . aspirin EC  81  mg Oral Daily  . budesonide  0.5 mg Nebulization BID  . cloNIDine  0.1 mg Oral BID   . diltiazem  240 mg Oral Daily  . furosemide  40 mg Intravenous Q12H  . insulin aspart  0-9 Units Subcutaneous TID WC  . insulin aspart  8 Units Subcutaneous TID WC  . insulin detemir  20 Units Subcutaneous BID  . ipratropium  0.5 mg Nebulization QID  . losartan  100 mg Oral Daily  . metoprolol tartrate  25 mg Oral BID  . [START ON 04/19/2018] potassium chloride SA  20 mEq Oral QODAY  . pravastatin  80 mg Oral QHS  . predniSONE  20 mg Oral BID WC  . sodium chloride flush  3 mL Intravenous Q12H   Continuous Infusions: . sodium chloride     PRN Meds:.sodium chloride, albuterol, sodium chloride flush  Micro Results No results found for this or any previous visit (from the past 240 hour(s)).  Radiology Reports Dg Chest Port 1 View  Result Date: 04/17/2018 CLINICAL DATA:  Shortness of breath and cough EXAM: PORTABLE CHEST 1 VIEW COMPARISON:  Chest radiograph 02/17/2018 FINDINGS: Unchanged cardiomediastinal contours. There is increased interstitial opacity. No pleural effusion or pneumothorax. IMPRESSION: Increased interstitial opacity, likely mild pulmonary edema. Electronically Signed   By: Ulyses Jarred M.D.   On: 04/17/2018 18:45    Time Spent in minutes  35   Breawna Montenegro M.D on 04/18/2018 at 10:49 AM  Between 7am to 7pm - Pager - [phone]  After 7pm go to [email or web address] - password [facility]  [facility] -  Office  [phone]

## 2018-04-18 NOTE — Progress Notes (Signed)
Inpatient Diabetes Program Recommendations  AACE/ADA: New Consensus Statement on Inpatient Glycemic Control (2015)  Target Ranges:  Prepandial:   less than 140 mg/dL      Peak postprandial:   less than 180 mg/dL (1-2 hours)      Critically ill patients:  140 - 180 mg/dL   Lab Results  Component Value Date   GLUCAP 289 (H) 04/18/2018   HGBA1C 11.8 (H) 02/19/2018    Review of Glycemic Control Results for TELLIS, SPIVAK (MRN [id number]) as of 04/18/2018 10:44  Ref. Range 04/17/2018 22:46 04/18/2018 07:45  Glucose-Capillary Latest Ref Range: 70 - 99 mg/dL 243 (H) 289 (H)   Diabetes history: DM2  Outpatient Diabetes medications: Levemir 20 units bid + Humalog 8 units tid meal coverage Current orders for Inpatient glycemic control: Levemir 20 units bid + Humalog 8 units tid meal coverage + Novolog sensitive correction scale  Inpatient Diabetes Program Recommendations:   Noted hyperglycemia. While on steroids in hospital: -Increase Novolog correction to moderate scale  Thank you, Nani Gasser. Juanmanuel Marohl, RN, MSN, CDE  Diabetes Coordinator Inpatient Glycemic Control Team Team Pager [phone] (8am-5pm) 04/18/2018 10:46 AM

## 2018-04-18 NOTE — Consult Note (Signed)
   [facility] CM Inpatient Consult   04/18/2018  Link OLLIE ESTY 1935/07/28 [id number]   Spoke with inpatient RNCM who indicates patient could benefit from [place] follow up. Reports Mr. Guzzo has declined home health and could use the additional support from [place] for his chronic conditions and management.   Chart reviewed and [facility] Pharmacist is active with patient for medication assistance. Please see chart review tab then encounters for patient outreach details.   Telephone call made to Mr. Taillon. Discussed that he is already active with [facility] Pharmacist. Discussed [place] follow up as well. Mr. Patil is agreeable and verbal consent obtained.   Confirmed best contact number is his home number [phone]. Primary Care Provider is Dena Billet, [place] ([place] is listed as doing transition of care calls).  Will make referral to [facility] for COPD, DM, CHF management. Will update [place] team as well.   Sent notification to inpatient RNCM to make aware referral to be made for [facility].   Marthenia Rolling, MSN-Ed, RN,BSN [facility] Liaison [phone]

## 2018-04-18 NOTE — Evaluation (Signed)
Physical Therapy Evaluation Patient Details Name: Edward [name] MRN: [id number] DOB: [birth year] Today's Date: 04/18/2018   History of Present Illness  Edward [name] is a 83 y.o. male with medical history significant of advanced COPD on 3 L of oxygen at home chronically, congestive heart failure, diabetes, noncompliance comes in with over day of worsening wheezing and shortness of breath.  He also reports some mild edema in his legs also not normal.  He denies any fevers.  He denies any cough.  He does report PND and orthopnea.  Patient referred for admission for acute on chronic respiratory failure with initially O2 sats 84% on his 3 L of oxygen.  This is much improved with several nebulizer treatments in the ED.  Referred for admission for COPD exacerbation with likely mild CHF exacerbation also.    Clinical Impression  Patient functioning at baseline for functional mobility and gait.  Plan:  Patient discharged from physical therapy to care of nursing for ambulation daily as tolerated for length of stay.     Follow Up Recommendations No PT follow up    Equipment Recommendations  None recommended by PT    Recommendations for Other Services       Precautions / Restrictions Precautions Precautions: None Restrictions Weight Bearing Restrictions: No      Mobility  Bed Mobility Overal bed mobility: Independent                Transfers Overall transfer level: Independent                  Ambulation/Gait Ambulation/Gait assistance: Modified independent (Device/Increase time) Gait Distance (Feet): 150 Feet Assistive device: None(PUSHING O2 TANK) Gait Pattern/deviations: WFL(Within Functional Limits) Gait velocity: normal   General Gait Details: [facility]  Stairs            Wheelchair Mobility    Modified Rankin (Stroke Patients Only)       Balance Overall balance assessment: No apparent balance deficits (not formally assessed)                                           Pertinent Vitals/Pain Pain Assessment: No/denies pain    Home Living Family/patient expects to be discharged to:: Private residence Living Arrangements: Spouse/significant other;Children Available Help at Discharge: Available 24 hours/day;Family Type of Home: Mobile home Home Access: Stairs to enter Entrance Stairs-Rails: Can reach both;Right;Left Entrance Stairs-Number of Steps: 5 Home Layout: One level Home Equipment: Walker - 4 wheels;Bedside commode      Prior Function Level of Independence: Independent with assistive device(s)         Comments: ambulates household and community distances with occasional use of rollator     Hand Dominance   Dominant Hand: Right    Extremity/Trunk Assessment   Upper Extremity Assessment Upper Extremity Assessment: Overall WFL for tasks assessed    Lower Extremity Assessment Lower Extremity Assessment: Overall WFL for tasks assessed    Cervical / Trunk Assessment Cervical / Trunk Assessment: Normal  Communication   Communication: No difficulties  Cognition Arousal/Alertness: Awake/alert Behavior During Therapy: WFL for tasks assessed/performed Overall Cognitive Status: Within Functional Limits for tasks assessed                                        General  Comments      Exercises     Assessment/Plan    PT Assessment Patent does not need any further PT services  PT Problem List         PT Treatment Interventions      PT Goals (Current goals can be found in the Care Plan section)  Acute Rehab PT Goals Patient Stated Goal: return home PT Goal Formulation: With patient Time For Goal Achievement: Apr 26, 2018 Potential to Achieve Goals: Good    Frequency     Barriers to discharge        Co-evaluation               AM-PAC PT "6 Clicks" Daily Activity  Outcome Measure Difficulty turning over in bed (including adjusting bedclothes, sheets and blankets)?:  None Difficulty moving from lying on back to sitting on the side of the bed? : None Difficulty sitting down on and standing up from a chair with arms (e.g., wheelchair, bedside commode, etc,.)?: None Help needed moving to and from a bed to chair (including a wheelchair)?: None Help needed walking in hospital room?: None Help needed climbing 3-5 steps with a railing? : None 6 Click Score: 24    End of Session   Activity Tolerance: Patient tolerated treatment well Patient left: in bed;with call bell/phone within reach Nurse Communication: Mobility status PT Visit Diagnosis: Unsteadiness on feet (R26.81);Other abnormalities of gait and mobility (R26.89);Muscle weakness (generalized) (M62.81)    Time: 8786-7672 PT Time Calculation (min) (ACUTE ONLY): 20 min   Charges:   PT Evaluation $PT Eval Low Complexity: 1 Low PT Treatments $Therapeutic Activity: 8-22 mins   PT G Codes:        12:20 PM, 2018/04/26 Lonell Grandchild, MPT Physical Therapist with [facility] [phone] office [phone] mobile phone

## 2018-04-19 ENCOUNTER — Encounter (HOSPITAL_COMMUNITY): Payer: Self-pay | Admitting: Primary Care

## 2018-04-19 DIAGNOSIS — N183 Chronic kidney disease, stage 3 unspecified: Secondary | ICD-10-CM

## 2018-04-19 DIAGNOSIS — Z515 Encounter for palliative care: Secondary | ICD-10-CM

## 2018-04-19 DIAGNOSIS — N170 Acute kidney failure with tubular necrosis: Secondary | ICD-10-CM

## 2018-04-19 DIAGNOSIS — Z7189 Other specified counseling: Secondary | ICD-10-CM

## 2018-04-19 LAB — CBC
HEMATOCRIT: 22.5 % — AB (ref 39.0–52.0)
HEMOGLOBIN: 6.8 g/dL — AB (ref 13.0–17.0)
MCH: 23 pg — AB (ref 26.0–34.0)
MCHC: 30.2 g/dL (ref 30.0–36.0)
MCV: 76 fL — AB (ref 78.0–100.0)
Platelets: 419 10*3/uL — ABNORMAL HIGH (ref 150–400)
RBC: 2.96 MIL/uL — ABNORMAL LOW (ref 4.22–5.81)
RDW: 17.8 % — ABNORMAL HIGH (ref 11.5–15.5)
WBC: 7.8 10*3/uL (ref 4.0–10.5)

## 2018-04-19 LAB — BASIC METABOLIC PANEL
ANION GAP: 8 (ref 5–15)
BUN: 44 mg/dL — ABNORMAL HIGH (ref 8–23)
CHLORIDE: 92 mmol/L — AB (ref 98–111)
CO2: 32 mmol/L (ref 22–32)
Calcium: 8.7 mg/dL — ABNORMAL LOW (ref 8.9–10.3)
Creatinine, Ser: 1.46 mg/dL — ABNORMAL HIGH (ref 0.61–1.24)
GFR calc Af Amer: 51 mL/min — ABNORMAL LOW (ref >60)
GFR, EST NON AFRICAN AMERICAN: 44 mL/min — AB (ref >60)
GLUCOSE: 296 mg/dL — AB (ref 70–99)
POTASSIUM: 5 mmol/L (ref 3.5–5.1)
Sodium: 132 mmol/L — ABNORMAL LOW (ref 135–145)

## 2018-04-19 LAB — PREPARE RBC (CROSSMATCH)

## 2018-04-19 LAB — GLUCOSE, CAPILLARY
GLUCOSE-CAPILLARY: 272 mg/dL — AB (ref 70–99)
Glucose-Capillary: 349 mg/dL — ABNORMAL HIGH (ref 70–99)
Glucose-Capillary: 130 mg/dL — ABNORMAL HIGH (ref 70–99)

## 2018-04-19 MED ORDER — INSULIN ASPART 100 UNIT/ML ~~LOC~~ SOLN
10.0000 [IU] | Freq: Three times a day (TID) | SUBCUTANEOUS | Status: DC
  Administered 2018-04-20 – 2018-04-21 (×5): 10 [IU] via SUBCUTANEOUS

## 2018-04-19 MED ORDER — INSULIN DETEMIR 100 UNIT/ML ~~LOC~~ SOLN
24.0000 [IU] | Freq: Two times a day (BID) | SUBCUTANEOUS | Status: DC
Start: 2018-04-19 — End: 2018-04-21
  Administered 2018-04-19 – 2018-04-21 (×4): 24 [IU] via SUBCUTANEOUS
  Filled 2018-04-19 (×6): qty 0.24

## 2018-04-19 MED ORDER — SODIUM CHLORIDE 0.9% IV SOLUTION
Freq: Once | INTRAVENOUS | Status: AC
  Administered 2018-04-19: 13:00:00 via INTRAVENOUS

## 2018-04-19 NOTE — Progress Notes (Addendum)
PROGRESS NOTE                                                                                                                                                                                                             Patient Demographics:    Edward [name], is a 83 y.o. male, DOB - 08/22/1935, RSW:[id number]  Admit date - 04/17/2018   Admitting Physician Phillips Grout, MD  Outpatient Primary MD for the patient is [name]  LOS - 2  Outpatient Specialists: none  Chief Complaint  Patient presents with  . Shortness of Breath       Brief Narrative  83 year old male with chronic respiratory failure on 2-3 L home O2 with frequent hospitalization, chronic diastolic CHF, uncontrolled type 2 diabetes mellitus, paroxysmal A. fib on Eliquis, microcytic anemia, presented to the ED with increasing shortness of breath, wheezing and some leg swellings for past few days.  In the ED he was found to have O2 desaturation to 84% on 3 L, improved with nebulizer. Patient admitted for COPD exacerbation with acute on chronic hypoxic respiratory failure and acute on chronic diastolic CHF.   Subjective:   Patient still reports wheezing and dyspnea with minimal improvement since admission.  Assessment  & Plan :    Principal Problem: Acute on chronic respiratory failure with hypoxia ([name]) COPD with acute exacerbation Improving on scheduled DuoNeb, Pulmicort nebs twice daily.  On oral prednisone 20 mg twice daily ( not escalated due to hyperglycemia). Maintaining O2 sat on 3 L. Continue antitussives and empiric azithromycin.   Active Problems: Acute on chronic diastolic CHF Patient was taking oral Lasix on alternate days and has been placed on 40 mg IV Lasix twice daily.  Diuresing well.  Continue current dose.  Microcytic anemia Noted for slow dropping H&H in the past few months (hemoglobin was 9.5 few months ago and is 7 this  admission).  Denies hematochezia or melena.  He is on chronic anticoagulants and baby aspirin.  Iron panel with severe iron deficiency.  Stool for Hemoccult pending.  Hemoglobin dropped to 6.8 today.  1 unit PRBC ordered..  I have held his Eliquis for today.   Reports having colonoscopy several years back. May need GI evaluation if Hemoccult positive, respiratory failure not sure if he will tolerate procedure.  Diabetes mellitus type 2,  uncontrolled with hyperglycemia Patient remains hyperglycemic with CBG of 300s this morning.  Increased Levemir dose to 25 units twice daily and pre-meal aspart to 10 units 3 times daily.  Continue resistant sliding scale coverage.    AF (paroxysmal atrial fibrillation) ([name]) Rate controlled on beta-blocker.  Eliquis held for drop in H&H.  Acute on chronic CKD (chronic kidney disease), stage III ([name]) Mild worsening renal function possibly with diuretic.  Will switch back to p.o. Lasix in a.m.       Code Status : Full code.  Palliative care consulted for goals of care discussion given recurrent hospitalization.  Patient wishes for full scope of treatment.  Family Communication  : None at bedside  Disposition Plan  : Home possibly in the next 48 hours if H&H stable and no inpatient GI work-up warranted.  Barriers For Discharge : Active symptoms  Consults  : Palliative care  Procedures  : None  DVT Prophylaxis  : Eliquis (held for drop in H&H)  Lab Results  Component Value Date   PLT 419 (H) 04/19/2018    Antibiotics  :    Anti-infectives (From admission, onward)   Start     Dose/Rate Route Frequency Ordered Stop   04/18/18 1130  azithromycin (ZITHROMAX) tablet 500 mg     500 mg Oral Daily 04/18/18 1125          Objective:   Vitals:   04/19/18 0515 04/19/18 0724 04/19/18 1329 04/19/18 1402  BP: 128/61  136/64 (!) 107/52  Pulse: 72  75 62  Resp: (!) 23  18 18   Temp: 97.9 F (36.6 C)  98.1 F (36.7 C) 97.8 F (36.6 C)  TempSrc:  Oral  Oral Oral  SpO2: 100% 98% 100% 100%  Weight:      Height:        Wt Readings from Last 3 Encounters:  04/17/18 84.2 kg (185 lb 9.6 oz)  04/07/18 87.1 kg (192 lb)  03/20/18 87.4 kg (192 lb 9.6 oz)     Intake/Output Summary (Last 24 hours) at 04/19/2018 1630 Last data filed at 04/19/2018 1500 Gross per 24 hour  Intake 735.5 ml  Output 1100 ml  Net -364.5 ml    Physical exam Elderly male not in distress, fatigue HEENT: Pallor present, moist mucosa, supple neck Chest: Few scattered rhonchi, improved from yesterday CVS: S1-S2 regular, no murmurs GI: Soft, nondistended, nontender Musculoskeletal: Warm, trace bilateral pitting edema improved     Data Review:    CBC Recent Labs  Lab 04/17/18 1758 04/18/18 0628 04/19/18 0538  WBC 7.5 5.2 7.8  HGB 7.0* 7.0* 6.8*  HCT 23.4* 23.4* 22.5*  PLT 390 359 419*  MCV 77.0* 76.5* 76.0*  MCH 23.0* 22.9* 23.0*  MCHC 29.9* 29.9* 30.2  RDW 17.8* 17.9* 17.8*  LYMPHSABS 2.2  --   --   MONOABS 0.9  --   --   EOSABS 0.7  --   --   BASOSABS 0.0  --   --     Chemistries  Recent Labs  Lab 04/17/18 1758 04/18/18 0628 04/19/18 0538  NA 132* 132* 132*  K 5.1 5.1 5.0  CL 96* 95* 92*  CO2 26 30 32  GLUCOSE 276* 299* 296*  BUN 35* 34* 44*  CREATININE 1.31* 1.22 1.46*  CALCIUM 8.7* 8.7* 8.7*  AST 17  --   --   ALT 12  --   --   ALKPHOS 71  --   --   BILITOT 0.4  --   --    ------------------------------------------------------------------------------------------------------------------  No results for input(s): CHOL, HDL, LDLCALC, TRIG, CHOLHDL, LDLDIRECT in the last 72 hours.  Lab Results  Component Value Date   HGBA1C 11.8 (H) 02/19/2018   ------------------------------------------------------------------------------------------------------------------ No results for input(s): TSH, T4TOTAL, T3FREE, THYROIDAB in the last 72 hours.  Invalid input(s):  FREET3 ------------------------------------------------------------------------------------------------------------------ Recent Labs    04/17/18 1758 04/18/18 0628  FERRITIN  --  7*  TIBC 490*  --   IRON 20*  --     Coagulation profile No results for input(s): INR, PROTIME in the last 168 hours.  No results for input(s): DDIMER in the last 72 hours.  Cardiac Enzymes Recent Labs  Lab 04/17/18 1758  TROPONINI <0.03   ------------------------------------------------------------------------------------------------------------------    Component Value Date/Time   BNP 428.0 (H) 04/17/2018 1758   BNP 121.5 (H) 03/21/2017 1513    Inpatient Medications  Scheduled Meds: . azithromycin  500 mg Oral Daily  . budesonide  0.5 mg Nebulization BID  . cloNIDine  0.1 mg Oral BID  . diltiazem  240 mg Oral Daily  . furosemide  40 mg Intravenous Q12H  . insulin aspart  0-20 Units Subcutaneous TID WC  . insulin aspart  8 Units Subcutaneous TID WC  . insulin detemir  20 Units Subcutaneous BID  . ipratropium-albuterol  3 mL Nebulization Q4H  . losartan  100 mg Oral Daily  . metoprolol tartrate  25 mg Oral BID  . polyethylene glycol  17 g Oral Daily  . potassium chloride SA  20 mEq Oral QODAY  . pravastatin  80 mg Oral QHS  . predniSONE  20 mg Oral BID WC  . sodium chloride flush  3 mL Intravenous Q12H   Continuous Infusions: . sodium chloride     PRN Meds:.sodium chloride, sodium chloride flush  Micro Results No results found for this or any previous visit (from the past 240 hour(s)).  Radiology Reports Dg Chest Port 1 View  Result Date: 04/17/2018 CLINICAL DATA:  Shortness of breath and cough EXAM: PORTABLE CHEST 1 VIEW COMPARISON:  Chest radiograph 02/17/2018 FINDINGS: Unchanged cardiomediastinal contours. There is increased interstitial opacity. No pleural effusion or pneumothorax. IMPRESSION: Increased interstitial opacity, likely mild pulmonary edema. Electronically Signed    By: Ulyses Jarred M.D.   On: 04/17/2018 18:45    Time Spent in minutes:35   Brizeyda Holtmeyer M.D on 04/19/2018 at 4:30 PM  Between 7am to 7pm - Pager - [phone]  After 7pm go to [email or web address] - password [facility]  [facility] -  Office  [phone]

## 2018-04-19 NOTE — Progress Notes (Signed)
CRITICAL VALUE ALERT  Critical Value:  HGB 6.8  Date & Time Notied:  04/19/18  Provider Notified: [id number]  Orders Received/Actions taken:

## 2018-04-19 NOTE — Consult Note (Signed)
Consultation Note Date: 04/19/2018   Patient Name: Edward [name]  DOB: September 10, 1935  MRN: [id number]  Age / Sex: 83 y.o., male  PCP: Edward [name] Referring Physician: Louellen Molder, MD  Reason for Consultation: Establishing goals of care  HPI/Patient Profile: 83 y.o. male  with past medical history of COPD with home oxygen use, chronic respiratory failure, allergic rhinitis, high blood pressure and cholesterol, diabetes, pulmonary fibrosis, admitted on 04/17/2018 with on chronic respiratory failure with hypoxia, chronic CHF.   Clinical Assessment and Goals of Care: Edward [name] is sitting up in the chair in his room.  He is sleeping, but wakes easily when I enter and call his name.  He is alert and oriented, no family at bedside at this time.  We talked about his chronic illnesses, he seems to have poor insight.  We talked about CODE STATUS, see below.  At this point, Edward [name] states that he continues to desire rehospitalization as needed.  He also requests to continue full CODE STATUS, with no limitations on time for life support. Please continue to consult PMT for this chronically ill patient.  Perhaps conversations with family can advance care plan.  Healthcare power of attorney NEXT OF KIN -wife and sons are Ambulance person.   SUMMARY OF RECOMMENDATIONS   Patient request full scope, full code.  Continue CODE STATUS discussions.  Code Status/Advance Care Planning:  Full code -we talked about "life support".  [name] states that he would want life support. I ask how long he would like life support, and he states he has no limits.   Symptom Management:   Per hospitalist, no additional needs at this time.  Palliative Prophylaxis:   No special needs at this time.  Additional Recommendations (Limitations, Scope, Preferences):  Full Scope Treatment  Psycho-social/Spiritual:     Desire for further Chaplaincy support:no  Additional Recommendations: Caregiving  Support/Resources and Education on Hospice  Prognosis:   < 6 months, or less would not be surprising based on advanced lung disease, frailty, multiple hospitalizations and ED visits in 6 months.  Discharge Planning: Home with no services anticipated.      Primary Diagnoses: Present on Admission: . COPD exacerbation ([place]) . Chronic diastolic CHF (congestive heart failure) ([place]) . CKD (chronic kidney disease), stage III ([place]) . AF (paroxysmal atrial fibrillation) ([place]) . Hypertension . Acute on chronic respiratory failure with hypoxia ([place])   I have reviewed the medical record, interviewed the patient and family, and examined the patient. The following aspects are pertinent.  Past Medical History:  Diagnosis Date  . Allergy    Rhinitis  . Bronchitis   . Chronic respiratory failure ([place])   . Colon polyps   . COPD (chronic obstructive pulmonary disease) ([place])   . Diabetes mellitus   . Elevated lipids   . Hypercholesterolemia   . Hypertension   . Noncompliance   . On home O2    2L N/C   . PSA elevation   . Pulmonary fibrosis ([place])   .  Vitamin D deficiency    Social History   Socioeconomic History  . Marital status: Married    Spouse name: Not on file  . Number of children: Not on file  . Years of education: Not on file  . Highest education level: Not on file  Occupational History  . 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 Financial resource strain: Not on file  . Food insecurity:    Worry: Not on file    Inability: Not on file  . Transportation needs:    Medical: Not on file    Non-medical: Not on file  Tobacco Use  . Smoking status: Former Smoker    Packs/day: 1.50    Years: 60.00    Pack years: 90.00    Types: Cigarettes    Last attempt to quit: 12/31/2012    Years since quitting: 5.3  . Smokeless tobacco: Never Used  Substance and Sexual  Activity  . Alcohol use: No  . Drug use: No  . Sexual activity: Yes    Birth control/protection: None  Lifestyle  . Physical activity:    Days per week: Not on file    Minutes per session: Not on file  . Stress: Not on file  Relationships  . Social connections:    Talks on phone: Not on file    Gets together: Not on file    Attends religious service: Not on file    Active member of club or organization: Not on file    Attends meetings of clubs or organizations: Not on file    Relationship status: Not on file  Other Topics Concern  . Not on file  Social History Narrative  . Not on file   Family History  Problem Relation Age of Onset  . Heart disease Mother   . CAD Other   . Diabetes Other    Scheduled Meds: . apixaban  2.5 mg Oral BID  . aspirin EC  81 mg Oral Daily  . azithromycin  500 mg Oral Daily  . budesonide  0.5 mg Nebulization BID  . cloNIDine  0.1 mg Oral BID  . diltiazem  240 mg Oral Daily  . furosemide  40 mg Intravenous Q12H  . insulin aspart  0-20 Units Subcutaneous TID WC  . insulin aspart  8 Units Subcutaneous TID WC  . insulin detemir  20 Units Subcutaneous BID  . ipratropium-albuterol  3 mL Nebulization Q4H  . losartan  100 mg Oral Daily  . metoprolol tartrate  25 mg Oral BID  . polyethylene glycol  17 g Oral Daily  . potassium chloride SA  20 mEq Oral QODAY  . pravastatin  80 mg Oral QHS  . predniSONE  20 mg Oral BID WC  . sodium chloride flush  3 mL Intravenous Q12H   Continuous Infusions: . sodium chloride     PRN Meds:.sodium chloride, sodium chloride flush Medications Prior to Admission:  Prior to Admission medications   Medication Sig Start Date End Date Taking? Authorizing Provider  albuterol ([name];VENTOLIN HFA) 108 (90 Base) MCG/ACT inhaler Inhale 2 puffs into the lungs every 6 (six) hours as needed for wheezing or shortness of breath. 04/11/18  Yes Edward Sheldon, PA-C  apixaban (ELIQUIS) 2.5 MG TABS tablet Take 1 tablet (2.5 mg  total) by mouth 2 (two) times daily. 04/10/18  Yes Edward Sheldon, PA-C  aspirin EC 81 MG tablet Take 81 mg by mouth daily.   Yes [provider]  budesonide (PULMICORT) 0.5 MG/2ML nebulizer solution Take 2 mLs (0.5 mg total) by nebulization 2 (two) times daily. 02/05/18  Yes Edward Billet B, PA-C  cloNIDine (CATAPRES) 0.1 MG tablet TAKE 1 TABLET BY MOUTH 2 TIMES A DAY 01/28/18  Yes Edward Billet B, PA-C  diltiazem (CARDIZEM CD) 240 MG 24 hr capsule TAKE 1 CAPSULE BY MOUTH DAILY 04/01/18  Yes Edward Billet B, PA-C  furosemide (LASIX) 40 MG tablet Take 1 tablet (40 mg total) by mouth every other day. 02/28/18 02/28/19 Yes Edward Billet B, PA-C  insulin detemir (LEVEMIR) 100 unit/ml SOLN Inject 0.2 mLs (20 Units total) into the skin 2 (two) times daily. 02/20/18  Yes Purohit, [name], MD  insulin lispro (HUMALOG) 100 UNIT/ML KwikPen Junior Inject 0.08 mLs (8 Units total) into the skin 3 (three) times daily. 02/20/18  Yes Purohit, [name], MD  ipratropium (ATROVENT) 0.02 % nebulizer solution Take 2.5 mLs (0.5 mg total) by nebulization 4 (four) times daily. 02/05/18  Yes Edward Billet B, PA-C  losartan (COZAAR) 100 MG tablet TAKE 1 TABLET BY MOUTH DAILY 02/22/18  Yes Edward Billet B, PA-C  metoprolol tartrate (LOPRESSOR) 25 MG tablet TAKE 1 TABLET BY MOUTH TWICE DAILY 02/22/18  Yes Edward Sheldon, PA-C  omeprazole (PRILOSEC) 20 MG capsule Take 1 capsule (20 mg total) by mouth daily. 02/04/18  Yes Barton Dubois, MD  [facility] DELICA LANCETS 24M MISC USE TO CHECK BLOOD SUGAR TWICE DAILY AS DIRECTED 02/22/18  Yes Edward Billet B, PA-C  OXYGEN Inhale 3 L into the lungs daily.    Yes [provider]  potassium chloride SA (K-DUR,KLOR-CON) 20 MEQ tablet Take 1 tablet (20 mEq total) by mouth every other day. When he takes lasix 02/28/18  Yes Edward Billet B, PA-C  pravastatin (PRAVACHOL) 80 MG tablet Take 1 tablet (80 mg total) by mouth at bedtime. 02/05/18  Yes Edward Sheldon, PA-C  predniSONE (DELTASONE) 20 MG tablet Take 2 daily  for 2 days then take 1 daily for 3 days. Patient taking differently: Take by mouth every other day.  03/20/18  Yes Edward Sheldon, PA-C   Allergies  Allergen Reactions  . Ace Inhibitors Other (See Comments)    Hyperkalemia--07/23/2013:patient states not familiar with the following allergy   Review of Systems  Unable to perform ROS: Age    Physical Exam  Constitutional: He appears ill.  Appears chronically ill, makes and briefly keeps eye contact  HENT:  Head: Atraumatic.  Cardiovascular: Normal rate.  Pulmonary/Chest: Effort normal. No respiratory distress.  Abdominal: Soft. He exhibits no distension.  Neurological: He is alert.  Skin: Skin is warm and dry.  Psychiatric: His mood appears not anxious. He is not agitated.  Nursing note and vitals reviewed.   Vital Signs: BP (!) 107/52 (BP Location: Left Arm)   Pulse 62   Temp 97.8 F (36.6 C) (Oral)   Resp 18   Ht 5\' 5"  (1.651 m)   Wt 84.2 kg (185 lb 9.6 oz)   SpO2 100%   BMI 30.89 kg/m  Pain Scale: 0-10   Pain Score: 0-No pain   SpO2: SpO2: 100 % O2 Device:SpO2: 100 % O2 Flow Rate: .O2 Flow Rate (L/min): 4 L/min  IO: Intake/output summary:   Intake/Output Summary (Last 24 hours) at 04/19/2018 1410 Last data filed at 04/19/2018 0838 Gross per 24 hour  Intake 480 ml  Output 1100 ml  Net -620 ml    LBM: Last BM Date: 04/17/18 Baseline Weight:  Weight: 87.1 kg (192 lb) Most recent weight: Weight: 84.2 kg (185 lb 9.6 oz)     Palliative Assessment/Data:   Flowsheet Rows     Most Recent Value  Intake Tab  Referral Department  Hospitalist  Unit at Time of Referral  Med/Surg Unit  Palliative Care Primary Diagnosis  Pulmonary  Date Notified  04/19/18  Palliative Care Type  Return patient Palliative Care  Reason for referral  Clarify Goals of Care  Date of Admission  04/17/18  Date first seen by Palliative Care  04/19/18  # of days Palliative referral response time  0 Day(s)  # of days IP prior to Palliative  referral  2  Clinical Assessment  Palliative Performance Scale Score  40%  Pain Max last 24 hours  Not able to report  Pain Min Last 24 hours  Not able to report  Dyspnea Max Last 24 Hours  Not able to report  Dyspnea Min Last 24 hours  Not able to report  Psychosocial & Spiritual Assessment  Palliative Care Outcomes  Patient/Family meeting held?  Yes  Who was at the meeting?  Patient at bedside      Time In: 1110 Time Out: 1140 Time Total: 30 minutes Greater than 50%  of this time was spent counseling and coordinating care related to the above assessment and plan.  Signed by: Drue Novel, NP   Please contact Palliative Medicine Team phone at [phone] for questions and concerns.  For individual provider: See Shea Evans

## 2018-04-19 NOTE — Care Management Important Message (Signed)
Important Message  Patient Details  Name: DETRICH RAKESTRAW MRN: [id number] Date of Birth: 02-05-1935   Medicare Important Message Given:  Yes    Sherald Barge, RN 04/19/2018, 10:53 AM

## 2018-04-20 DIAGNOSIS — N170 Acute kidney failure with tubular necrosis: Secondary | ICD-10-CM

## 2018-04-20 DIAGNOSIS — J9621 Acute and chronic respiratory failure with hypoxia: Secondary | ICD-10-CM

## 2018-04-20 LAB — BASIC METABOLIC PANEL
ANION GAP: 8 (ref 5–15)
BUN: 51 mg/dL — ABNORMAL HIGH (ref 8–23)
CO2: 31 mmol/L (ref 22–32)
Calcium: 8.8 mg/dL — ABNORMAL LOW (ref 8.9–10.3)
Chloride: 96 mmol/L — ABNORMAL LOW (ref 98–111)
Creatinine, Ser: 1.35 mg/dL — ABNORMAL HIGH (ref 0.61–1.24)
GFR calc non Af Amer: 48 mL/min — ABNORMAL LOW (ref >60)
GFR, EST AFRICAN AMERICAN: 56 mL/min — AB (ref >60)
Glucose, Bld: 68 mg/dL — ABNORMAL LOW (ref 70–99)
POTASSIUM: 4.5 mmol/L (ref 3.5–5.1)
SODIUM: 135 mmol/L (ref 135–145)

## 2018-04-20 LAB — GLUCOSE, CAPILLARY
GLUCOSE-CAPILLARY: 128 mg/dL — AB (ref 70–99)
GLUCOSE-CAPILLARY: 73 mg/dL (ref 70–99)
GLUCOSE-CAPILLARY: 310 mg/dL — AB (ref 70–99)
Glucose-Capillary: 199 mg/dL — ABNORMAL HIGH (ref 70–99)
Glucose-Capillary: 183 mg/dL — ABNORMAL HIGH (ref 70–99)

## 2018-04-20 LAB — CBC
HEMATOCRIT: 25 % — AB (ref 39.0–52.0)
HEMOGLOBIN: 7.8 g/dL — AB (ref 13.0–17.0)
MCH: 24 pg — ABNORMAL LOW (ref 26.0–34.0)
MCHC: 31.2 g/dL (ref 30.0–36.0)
MCV: 76.9 fL — ABNORMAL LOW (ref 78.0–100.0)
Platelets: 373 10*3/uL (ref 150–400)
RBC: 3.25 MIL/uL — ABNORMAL LOW (ref 4.22–5.81)
RDW: 17.3 % — ABNORMAL HIGH (ref 11.5–15.5)
WBC: 9.1 10*3/uL (ref 4.0–10.5)

## 2018-04-20 MED ORDER — SORBITOL 70 % SOLN
960.0000 mL | TOPICAL_OIL | Freq: Once | ORAL | Status: AC
  Administered 2018-04-20: 960 mL via RECTAL
  Filled 2018-04-20: qty 473

## 2018-04-20 MED ORDER — IPRATROPIUM-ALBUTEROL 0.5-2.5 (3) MG/3ML IN SOLN
3.0000 mL | Freq: Four times a day (QID) | RESPIRATORY_TRACT | Status: DC
  Administered 2018-04-21 – 2018-04-22 (×7): 3 mL via RESPIRATORY_TRACT
  Filled 2018-04-20 (×7): qty 3

## 2018-04-20 MED ORDER — PREDNISONE 20 MG PO TABS
20.0000 mg | ORAL_TABLET | Freq: Every day | ORAL | Status: DC
  Administered 2018-04-21 – 2018-04-22 (×2): 20 mg via ORAL
  Filled 2018-04-20 (×2): qty 1

## 2018-04-20 NOTE — Progress Notes (Signed)
Patient O2 sat on 3L at rest: 97% Patient O2 sat on 3L while walking: 89  Pt ambulated approximately 400 feet with one brief rest.  O2 sat once returned to room is 98% on 3L and his only complaint is weakness to both legs.

## 2018-04-20 NOTE — Progress Notes (Signed)
PROGRESS NOTE                                                                                                                                                                                                             Patient Demographics:    Edward [name], is a 83 y.o. male, DOB - [birth year], IHW:[id number]  Admit date - 04/17/2018   Admitting Physician Phillips Grout, MD  Outpatient Primary MD for the patient is [name]  LOS - 3  Outpatient Specialists: none  Chief Complaint  Patient presents with  . Shortness of Breath       Brief Narrative  83 year old male with chronic respiratory failure on 2-3 L home O2 with frequent hospitalization, chronic diastolic CHF, uncontrolled type 2 diabetes mellitus, paroxysmal A. fib on Eliquis, microcytic anemia, presented to the ED with increasing shortness of breath, wheezing and some leg swellings for past few days.  In the ED he was found to have O2 desaturation to 84% on 3 L, improved with nebulizer. Patient admitted for COPD exacerbation with acute on chronic hypoxic respiratory failure and acute on chronic diastolic CHF.   Subjective:   Overall he is feeling better.  Feels that respiratory status may be approaching baseline.  He has not had a bowel movement as of yet.  Assessment  & Plan :    Principal Problem: Acute on chronic respiratory failure with hypoxia ([name]) COPD with acute exacerbation Improving on scheduled DuoNeb, Pulmicort nebs twice daily.  On oral prednisone 20 mg twice daily ( not escalated due to hyperglycemia).  He does not appear to be wheezing at this time.  Will change prednisone to daily. Maintaining O2 sat on 3 L. Continue antitussives and empiric azithromycin.   Active Problems: Acute on chronic diastolic CHF Patient was taking oral Lasix on alternate days and has been placed on 40 mg IV Lasix twice daily.  Diuresing well.  Continue  current dose.  Microcytic anemia Noted for slow dropping H&H in the past few months (hemoglobin was 9.5 few months ago and is 7 this admission).  Denies hematochezia or melena.  He is on chronic anticoagulants and baby aspirin.  Iron panel with severe iron deficiency.  Stool for Hemoccult pending.  Hemoglobin dropped to 6.8 on 7/19.  He was transfused 1 unit of PRBC with improvement  of hemoglobin.  I have held his Eliquis for today.  Continue to follow CBC to ensure stability  Reports having colonoscopy several years back. May need GI evaluation if Hemoccult positive   Diabetes mellitus type 2, uncontrolled with hyperglycemia Blood sugars continue to fluctuate, likely more elevated due to steroids.  Anticipate improvement of blood sugars as steroids are tapered.    AF (paroxysmal atrial fibrillation) ([name]) Rate controlled on beta-blocker.  Eliquis held for drop in H&H.  Acute on chronic CKD (chronic kidney disease), stage III ([name]) Creatinine improving with diuresis.  We will continue current treatments for today.  He has good urine output.       Code Status : Full code.  Palliative care consulted for goals of care discussion given recurrent hospitalization.  Patient wishes for full scope of treatment.  Family Communication  : None at bedside  Disposition Plan  : Home possibly in the next 24 hours if H&H stable and no inpatient GI work-up warranted.  Barriers For Discharge : Active symptoms  Consults  : Palliative care  Procedures  : None  DVT Prophylaxis  : Eliquis (held for drop in H&H)  Lab Results  Component Value Date   PLT 373 04/20/2018    Antibiotics  :    Anti-infectives (From admission, onward)   Start     Dose/Rate Route Frequency Ordered Stop   04/18/18 1130  azithromycin (ZITHROMAX) tablet 500 mg     500 mg Oral Daily 04/18/18 1125          Objective:   Vitals:   04/20/18 0504 04/20/18 0734 04/20/18 1300 04/20/18 1457  BP: 128/67   137/62  Pulse: 68    72  Resp: 18   20  Temp:    98.6 F (37 C)  TempSrc:    Oral  SpO2: 99% 93% 97% 98%  Weight:      Height:        Wt Readings from Last 3 Encounters:  04/17/18 84.2 kg (185 lb 9.6 oz)  04/07/18 87.1 kg (192 lb)  03/20/18 87.4 kg (192 lb 9.6 oz)     Intake/Output Summary (Last 24 hours) at 04/20/2018 1754 Last data filed at 04/20/2018 1631 Gross per 24 hour  Intake 240 ml  Output 2400 ml  Net -2160 ml    Physical exam General exam: Alert, awake, oriented x 3 Respiratory system: Crackles at bases. Respiratory effort normal. Cardiovascular system:RRR. No murmurs, rubs, gallops. Gastrointestinal system: Abdomen is nondistended, soft and nontender. No organomegaly or masses felt. Normal bowel sounds heard. Central nervous system: Alert and oriented. No focal neurological deficits. Extremities: Trace pitting edema bilaterally Skin: No rashes, lesions or ulcers Psychiatry: Judgement and insight appear normal. Mood & affect appropriate.    Data Review:    CBC Recent Labs  Lab 04/17/18 1758 04/18/18 0628 04/19/18 0538 04/20/18 0634  WBC 7.5 5.2 7.8 9.1  HGB 7.0* 7.0* 6.8* 7.8*  HCT 23.4* 23.4* 22.5* 25.0*  PLT 390 359 419* 373  MCV 77.0* 76.5* 76.0* 76.9*  MCH 23.0* 22.9* 23.0* 24.0*  MCHC 29.9* 29.9* 30.2 31.2  RDW 17.8* 17.9* 17.8* 17.3*  LYMPHSABS 2.2  --   --   --   MONOABS 0.9  --   --   --   EOSABS 0.7  --   --   --   BASOSABS 0.0  --   --   --     Chemistries  Recent Labs  Lab 04/17/18 1758 04/18/18 4098  04/19/18 0538 04/20/18 0634  NA 132* 132* 132* 135  K 5.1 5.1 5.0 4.5  CL 96* 95* 92* 96*  CO2 26 30 32 31  GLUCOSE 276* 299* 296* 68*  BUN 35* 34* 44* 51*  CREATININE 1.31* 1.22 1.46* 1.35*  CALCIUM 8.7* 8.7* 8.7* 8.8*  AST 17  --   --   --   ALT 12  --   --   --   ALKPHOS 71  --   --   --   BILITOT 0.4  --   --   --    ------------------------------------------------------------------------------------------------------------------ No  results for input(s): CHOL, HDL, LDLCALC, TRIG, CHOLHDL, LDLDIRECT in the last 72 hours.  Lab Results  Component Value Date   HGBA1C 11.8 (H) 02/19/2018   ------------------------------------------------------------------------------------------------------------------ No results for input(s): TSH, T4TOTAL, T3FREE, THYROIDAB in the last 72 hours.  Invalid input(s): FREET3 ------------------------------------------------------------------------------------------------------------------ Recent Labs    04/17/18 1758 04/18/18 0628  FERRITIN  --  7*  TIBC 490*  --   IRON 20*  --     Coagulation profile No results for input(s): INR, PROTIME in the last 168 hours.  No results for input(s): DDIMER in the last 72 hours.  Cardiac Enzymes Recent Labs  Lab 04/17/18 1758  TROPONINI <0.03   ------------------------------------------------------------------------------------------------------------------    Component Value Date/Time   BNP 428.0 (H) 04/17/2018 1758   BNP 121.5 (H) 03/21/2017 1513    Inpatient Medications  Scheduled Meds: . azithromycin  500 mg Oral Daily  . budesonide  0.5 mg Nebulization BID  . cloNIDine  0.1 mg Oral BID  . diltiazem  240 mg Oral Daily  . furosemide  40 mg Intravenous Q12H  . insulin aspart  0-20 Units Subcutaneous TID WC  . insulin aspart  10 Units Subcutaneous TID WC  . insulin detemir  24 Units Subcutaneous BID  . ipratropium-albuterol  3 mL Nebulization Q4H  . losartan  100 mg Oral Daily  . metoprolol tartrate  25 mg Oral BID  . polyethylene glycol  17 g Oral Daily  . potassium chloride SA  20 mEq Oral QODAY  . pravastatin  80 mg Oral QHS  . [START ON 04/21/2018] predniSONE  20 mg Oral Q breakfast  . sodium chloride flush  3 mL Intravenous Q12H  . sorbitol, milk of mag, mineral oil, glycerin (SMOG) enema  960 mL Rectal Once   Continuous Infusions: . sodium chloride     PRN Meds:.sodium chloride, sodium chloride flush  Micro  Results No results found for this or any previous visit (from the past 240 hour(s)).  Radiology Reports Dg Chest Port 1 View  Result Date: 04/17/2018 CLINICAL DATA:  Shortness of breath and cough EXAM: PORTABLE CHEST 1 VIEW COMPARISON:  Chest radiograph 02/17/2018 FINDINGS: Unchanged cardiomediastinal contours. There is increased interstitial opacity. No pleural effusion or pneumothorax. IMPRESSION: Increased interstitial opacity, likely mild pulmonary edema. Electronically Signed   By: Ulyses Jarred M.D.   On: 04/17/2018 18:45    Time Spent in minutes:35   Kathie Dike M.D on 04/20/2018 at 5:54 PM  Between 7am to 7pm - Pager [phone]  After 7pm go to [email or web address] - password [facility]  [facility] -  Office  [phone]

## 2018-04-21 LAB — CBC
HCT: 25.7 % — ABNORMAL LOW (ref 39.0–52.0)
Hemoglobin: 7.9 g/dL — ABNORMAL LOW (ref 13.0–17.0)
MCH: 23.9 pg — ABNORMAL LOW (ref 26.0–34.0)
MCHC: 30.7 g/dL (ref 30.0–36.0)
MCV: 77.6 fL — AB (ref 78.0–100.0)
PLATELETS: 356 10*3/uL (ref 150–400)
RBC: 3.31 MIL/uL — ABNORMAL LOW (ref 4.22–5.81)
RDW: 17.5 % — AB (ref 11.5–15.5)
WBC: 8 10*3/uL (ref 4.0–10.5)

## 2018-04-21 LAB — GLUCOSE, CAPILLARY
Glucose-Capillary: 79 mg/dL (ref 70–99)
Glucose-Capillary: 221 mg/dL — ABNORMAL HIGH (ref 70–99)
Glucose-Capillary: 248 mg/dL — ABNORMAL HIGH (ref 70–99)
Glucose-Capillary: 151 mg/dL — ABNORMAL HIGH (ref 70–99)

## 2018-04-21 LAB — BASIC METABOLIC PANEL
Anion gap: 6 (ref 5–15)
BUN: 53 mg/dL — AB (ref 8–23)
CHLORIDE: 96 mmol/L — AB (ref 98–111)
CO2: 34 mmol/L — ABNORMAL HIGH (ref 22–32)
CREATININE: 1.43 mg/dL — AB (ref 0.61–1.24)
Calcium: 8.8 mg/dL — ABNORMAL LOW (ref 8.9–10.3)
GFR calc Af Amer: 52 mL/min — ABNORMAL LOW (ref >60)
GFR, EST NON AFRICAN AMERICAN: 45 mL/min — AB (ref >60)
GLUCOSE: 111 mg/dL — AB (ref 70–99)
Potassium: 4.9 mmol/L (ref 3.5–5.1)
Sodium: 136 mmol/L (ref 135–145)

## 2018-04-21 LAB — OCCULT BLOOD X 1 CARD TO LAB, STOOL: FECAL OCCULT BLD: POSITIVE — AB

## 2018-04-21 MED ORDER — SORBITOL 70 % SOLN
960.0000 mL | TOPICAL_OIL | Freq: Once | ORAL | Status: AC
  Administered 2018-04-21: 960 mL via RECTAL
  Filled 2018-04-21: qty 473

## 2018-04-21 MED ORDER — INSULIN DETEMIR 100 UNIT/ML ~~LOC~~ SOLN
20.0000 [IU] | Freq: Two times a day (BID) | SUBCUTANEOUS | Status: DC
Start: 2018-04-21 — End: 2018-04-22
  Administered 2018-04-21: 20 [IU] via SUBCUTANEOUS
  Filled 2018-04-21 (×2): qty 0.2

## 2018-04-21 MED ORDER — FUROSEMIDE 40 MG PO TABS
40.0000 mg | ORAL_TABLET | Freq: Every day | ORAL | Status: DC
  Administered 2018-04-21 – 2018-04-23 (×3): 40 mg via ORAL
  Filled 2018-04-21 (×4): qty 1

## 2018-04-21 NOTE — Progress Notes (Signed)
PROGRESS NOTE                                                                                                                                                                                                             Patient Demographics:    Edward [name], is a 83 y.o. male, DOB - October 16, 1934, LOV:[id number]  Admit date - 04/17/2018   Admitting Physician Phillips Grout, MD  Outpatient Primary MD for the patient is [name]  LOS - 4  Outpatient Specialists: none  Chief Complaint  Patient presents with  . Shortness of Breath       Brief Narrative  83 year old male with chronic respiratory failure on 2-3 L home O2 with frequent hospitalization, chronic diastolic CHF, uncontrolled type 2 diabetes mellitus, paroxysmal A. fib on Eliquis, microcytic anemia, presented to the ED with increasing shortness of breath, wheezing and some leg swellings for past few days.  In the ED he was found to have O2 desaturation to 84% on 3 L, improved with nebulizer. Patient admitted for COPD exacerbation with acute on chronic hypoxic respiratory failure and acute on chronic diastolic CHF.   Subjective:   No shortness of breath. No wheezing. No abdominal pain.  Assessment  & Plan :    Principal Problem: Acute on chronic respiratory failure with hypoxia ([name]) COPD with acute exacerbation Improving on scheduled DuoNeb, Pulmicort nebs twice daily. On oral prednisone taper. Maintaining O2 sat on 3 L. Continue antitussives and empiric azithromycin. Will discontinue azithromycin after tomorrows dose.   Active Problems: Acute on chronic diastolic CHF Patient was taking oral Lasix on alternate days but in the hospital was treated with 40 mg IV Lasix twice daily.  Diuresing well.  Overall volume status seems to be approaching euvolemia. Lasix changed to lasix 40mg  po daily.   Microcytic anemia Noted for slow dropping H&H in the past  few months (hemoglobin was 9.5 few months ago and is 7 this admission).  Denies hematochezia or melena.  He is on chronic anticoagulants and baby aspirin.  Iron panel with severe iron deficiency.  Hemoglobin dropped to 6.8 on 7/19.  He was transfused 1 unit of PRBC with improvement of hemoglobin. Eliquis has been held.  Follow up hemoglobin has been stable. Continue to follow CBC to ensure stability  Reports having colonoscopy several years back.  Stool for Hemoccult positive. Discussed with Dr. Laural Golden who will see the patient in AM. Keep on clear liquids for now  Diabetes mellitus type 2, uncontrolled with hyperglycemia Blood sugars continue to fluctuate, likely more elevated due to steroids.  Anticipate improvement of blood sugars as steroids are tapered.    AF (paroxysmal atrial fibrillation) ([name]) Rate controlled on beta-blocker.  Eliquis held for drop in H&H.  Acute on chronic CKD (chronic kidney disease), stage III ([name]) Creatinine stablewith diuresis.  We will continue current treatments for today.  He has good urine output.       Code Status : Full code.  Palliative care consulted for goals of care discussion given recurrent hospitalization.  Patient wishes for full scope of treatment.  Family Communication  : None at bedside  Disposition Plan  : Home after GI work up complete  Barriers For Discharge : Active symptoms  Consults  : Palliative care, Gastroenterology   Procedures  : None  DVT Prophylaxis  : Eliquis (held for drop in H&H)  Lab Results  Component Value Date   PLT 356 04/21/2018    Antibiotics  :    Anti-infectives (From admission, onward)   Start     Dose/Rate Route Frequency Ordered Stop   04/18/18 1130  azithromycin (ZITHROMAX) tablet 500 mg     500 mg Oral Daily 04/18/18 1125          Objective:   Vitals:   04/20/18 2154 04/21/18 0749 04/21/18 1503 04/21/18 1547  BP: (!) 158/70  136/64   Pulse: 73  63   Resp: 18  18   Temp: 98.8 F (37.1 C)   98.3 F (36.8 C)   TempSrc: Oral  Oral   SpO2: 97% 98% 100% 95%  Weight:      Height:        Wt Readings from Last 3 Encounters:  04/17/18 84.2 kg (185 lb 9.6 oz)  04/07/18 87.1 kg (192 lb)  03/20/18 87.4 kg (192 lb 9.6 oz)     Intake/Output Summary (Last 24 hours) at 04/21/2018 1722 Last data filed at 04/21/2018 1316 Gross per 24 hour  Intake 240 ml  Output 875 ml  Net -635 ml    Physical exam General exam: Alert, awake, oriented x 3 Respiratory system: Clear to auscultation. Respiratory effort normal. Cardiovascular system:RRR. No murmurs, rubs, gallops. Gastrointestinal system: Abdomen is nondistended, soft and nontender. No organomegaly or masses felt. Normal bowel sounds heard. Central nervous system: Alert and oriented. No focal neurological deficits. Extremities: No C/C/E, +pedal pulses Skin: No rashes, lesions or ulcers Psychiatry: Judgement and insight appear normal. Mood & affect appropriate.     Data Review:    CBC Recent Labs  Lab 04/17/18 1758 04/18/18 0628 04/19/18 0538 04/20/18 0634 04/21/18 0544  WBC 7.5 5.2 7.8 9.1 8.0  HGB 7.0* 7.0* 6.8* 7.8* 7.9*  HCT 23.4* 23.4* 22.5* 25.0* 25.7*  PLT 390 359 419* 373 356  MCV 77.0* 76.5* 76.0* 76.9* 77.6*  MCH 23.0* 22.9* 23.0* 24.0* 23.9*  MCHC 29.9* 29.9* 30.2 31.2 30.7  RDW 17.8* 17.9* 17.8* 17.3* 17.5*  LYMPHSABS 2.2  --   --   --   --   MONOABS 0.9  --   --   --   --   EOSABS 0.7  --   --   --   --   BASOSABS 0.0  --   --   --   --     Chemistries  Recent Labs  Lab 04/17/18 1758 04/18/18 0628 04/19/18 0538 04/20/18 0634 04/21/18 0544  NA 132* 132* 132* 135 136  K 5.1 5.1 5.0 4.5 4.9  CL 96* 95* 92* 96* 96*  CO2 26 30 32 31 34*  GLUCOSE 276* 299* 296* 68* 111*  BUN 35* 34* 44* 51* 53*  CREATININE 1.31* 1.22 1.46* 1.35* 1.43*  CALCIUM 8.7* 8.7* 8.7* 8.8* 8.8*  AST 17  --   --   --   --   ALT 12  --   --   --   --   ALKPHOS 71  --   --   --   --   BILITOT 0.4  --   --   --   --     ------------------------------------------------------------------------------------------------------------------ No results for input(s): CHOL, HDL, LDLCALC, TRIG, CHOLHDL, LDLDIRECT in the last 72 hours.  Lab Results  Component Value Date   HGBA1C 11.8 (H) 02/19/2018   ------------------------------------------------------------------------------------------------------------------ No results for input(s): TSH, T4TOTAL, T3FREE, THYROIDAB in the last 72 hours.  Invalid input(s): FREET3 ------------------------------------------------------------------------------------------------------------------ No results for input(s): VITAMINB12, FOLATE, FERRITIN, TIBC, IRON, RETICCTPCT in the last 72 hours.  Coagulation profile No results for input(s): INR, PROTIME in the last 168 hours.  No results for input(s): DDIMER in the last 72 hours.  Cardiac Enzymes Recent Labs  Lab 04/17/18 1758  TROPONINI <0.03   ------------------------------------------------------------------------------------------------------------------    Component Value Date/Time   BNP 428.0 (H) 04/17/2018 1758   BNP 121.5 (H) 03/21/2017 1513    Inpatient Medications  Scheduled Meds: . azithromycin  500 mg Oral Daily  . budesonide  0.5 mg Nebulization BID  . cloNIDine  0.1 mg Oral BID  . diltiazem  240 mg Oral Daily  . furosemide  40 mg Oral Daily  . insulin aspart  0-20 Units Subcutaneous TID WC  . insulin aspart  10 Units Subcutaneous TID WC  . insulin detemir  24 Units Subcutaneous BID  . ipratropium-albuterol  3 mL Nebulization Q6H  . losartan  100 mg Oral Daily  . metoprolol tartrate  25 mg Oral BID  . polyethylene glycol  17 g Oral Daily  . potassium chloride SA  20 mEq Oral QODAY  . pravastatin  80 mg Oral QHS  . predniSONE  20 mg Oral Q breakfast  . sodium chloride flush  3 mL Intravenous Q12H   Continuous Infusions: . sodium chloride     PRN Meds:.sodium chloride, sodium chloride  flush  Micro Results No results found for this or any previous visit (from the past 240 hour(s)).  Radiology Reports Dg Chest Port 1 View  Result Date: 04/17/2018 CLINICAL DATA:  Shortness of breath and cough EXAM: PORTABLE CHEST 1 VIEW COMPARISON:  Chest radiograph 02/17/2018 FINDINGS: Unchanged cardiomediastinal contours. There is increased interstitial opacity. No pleural effusion or pneumothorax. IMPRESSION: Increased interstitial opacity, likely mild pulmonary edema. Electronically Signed   By: Ulyses Jarred M.D.   On: 04/17/2018 18:45    Time Spent in minutes:35   Kathie Dike M.D on 04/21/2018 at 5:22 PM  Between 7am to 7pm - Pager [phone]  After 7pm go to [email or web address] - password [facility]  [facility] -  Office  [phone]

## 2018-04-22 DIAGNOSIS — D649 Anemia, unspecified: Secondary | ICD-10-CM

## 2018-04-22 DIAGNOSIS — N183 Chronic kidney disease, stage 3 (moderate): Secondary | ICD-10-CM

## 2018-04-22 DIAGNOSIS — J441 Chronic obstructive pulmonary disease with (acute) exacerbation: Principal | ICD-10-CM

## 2018-04-22 DIAGNOSIS — I509 Heart failure, unspecified: Secondary | ICD-10-CM

## 2018-04-22 DIAGNOSIS — K921 Melena: Secondary | ICD-10-CM

## 2018-04-22 DIAGNOSIS — I5032 Chronic diastolic (congestive) heart failure: Secondary | ICD-10-CM

## 2018-04-22 DIAGNOSIS — R0603 Acute respiratory distress: Secondary | ICD-10-CM

## 2018-04-22 DIAGNOSIS — I480 Paroxysmal atrial fibrillation: Secondary | ICD-10-CM

## 2018-04-22 LAB — GLUCOSE, CAPILLARY
GLUCOSE-CAPILLARY: 145 mg/dL — AB (ref 70–99)
GLUCOSE-CAPILLARY: 42 mg/dL — AB (ref 70–99)
GLUCOSE-CAPILLARY: 59 mg/dL — AB (ref 70–99)
Glucose-Capillary: 179 mg/dL — ABNORMAL HIGH (ref 70–99)
Glucose-Capillary: 154 mg/dL — ABNORMAL HIGH (ref 70–99)
Glucose-Capillary: 275 mg/dL — ABNORMAL HIGH (ref 70–99)

## 2018-04-22 LAB — CBC
HCT: 26.4 % — ABNORMAL LOW (ref 39.0–52.0)
Hemoglobin: 7.9 g/dL — ABNORMAL LOW (ref 13.0–17.0)
MCH: 23.6 pg — ABNORMAL LOW (ref 26.0–34.0)
MCHC: 29.9 g/dL — ABNORMAL LOW (ref 30.0–36.0)
MCV: 78.8 fL (ref 78.0–100.0)
PLATELETS: 357 10*3/uL (ref 150–400)
RBC: 3.35 MIL/uL — AB (ref 4.22–5.81)
RDW: 17.4 % — ABNORMAL HIGH (ref 11.5–15.5)
WBC: 8.3 10*3/uL (ref 4.0–10.5)

## 2018-04-22 LAB — BASIC METABOLIC PANEL
ANION GAP: 6 (ref 5–15)
BUN: 50 mg/dL — ABNORMAL HIGH (ref 8–23)
CO2: 34 mmol/L — ABNORMAL HIGH (ref 22–32)
Calcium: 8.6 mg/dL — ABNORMAL LOW (ref 8.9–10.3)
Chloride: 99 mmol/L (ref 98–111)
Creatinine, Ser: 1.33 mg/dL — ABNORMAL HIGH (ref 0.61–1.24)
GFR calc Af Amer: 57 mL/min — ABNORMAL LOW (ref >60)
GFR, EST NON AFRICAN AMERICAN: 49 mL/min — AB (ref >60)
GLUCOSE: 62 mg/dL — AB (ref 70–99)
POTASSIUM: 4.3 mmol/L (ref 3.5–5.1)
SODIUM: 139 mmol/L (ref 135–145)

## 2018-04-22 MED ORDER — DEXTROSE 50 % IV SOLN
INTRAVENOUS | Status: AC
  Administered 2018-04-22: 50 mL
  Filled 2018-04-22: qty 50

## 2018-04-22 MED ORDER — PREDNISONE 10 MG PO TABS
10.0000 mg | ORAL_TABLET | Freq: Every day | ORAL | Status: DC
  Administered 2018-04-23: 10 mg via ORAL
  Filled 2018-04-22: qty 1

## 2018-04-22 MED ORDER — DEXTROSE 50 % IV SOLN
1.0000 | Freq: Once | INTRAVENOUS | Status: AC
  Administered 2018-04-22: 50 mL via INTRAVENOUS

## 2018-04-22 MED ORDER — GLUCOSE 40 % PO GEL
ORAL | Status: AC
  Administered 2018-04-22: 13:00:00
  Filled 2018-04-22: qty 1

## 2018-04-22 MED ORDER — PANTOPRAZOLE SODIUM 40 MG PO TBEC
40.0000 mg | DELAYED_RELEASE_TABLET | Freq: Two times a day (BID) | ORAL | Status: DC
  Administered 2018-04-22 – 2018-04-23 (×2): 40 mg via ORAL
  Filled 2018-04-22 (×2): qty 1

## 2018-04-22 MED ORDER — DEXTROSE 50 % IV SOLN: 1.0000 | Freq: Once | INTRAVENOUS | Status: AC

## 2018-04-22 MED ORDER — PEG 3350-KCL-NA BICARB-NACL 420 G PO SOLR
2000.0000 mL | Freq: Once | ORAL | Status: AC
  Administered 2018-04-23: 2000 mL via ORAL
  Filled 2018-04-22: qty 4000

## 2018-04-22 MED ORDER — INSULIN DETEMIR 100 UNIT/ML ~~LOC~~ SOLN
10.0000 [IU] | Freq: Every day | SUBCUTANEOUS | Status: DC
  Administered 2018-04-22: 10 [IU] via SUBCUTANEOUS
  Filled 2018-04-22 (×2): qty 0.1

## 2018-04-22 MED ORDER — INSULIN DETEMIR 100 UNIT/ML ~~LOC~~ SOLN
20.0000 [IU] | Freq: Every day | SUBCUTANEOUS | Status: DC
  Filled 2018-04-22: qty 0.2

## 2018-04-22 MED ORDER — INSULIN ASPART 100 UNIT/ML ~~LOC~~ SOLN
0.0000 [IU] | Freq: Three times a day (TID) | SUBCUTANEOUS | Status: DC
  Administered 2018-04-23: 1 [IU] via SUBCUTANEOUS

## 2018-04-22 MED ORDER — PEG 3350-KCL-NA BICARB-NACL 420 G PO SOLR
2000.0000 mL | Freq: Once | ORAL | Status: AC
  Administered 2018-04-22: 2000 mL via ORAL
  Filled 2018-04-22: qty 4000

## 2018-04-22 MED ORDER — IPRATROPIUM-ALBUTEROL 0.5-2.5 (3) MG/3ML IN SOLN
3.0000 mL | Freq: Four times a day (QID) | RESPIRATORY_TRACT | Status: DC
  Administered 2018-04-23 (×2): 3 mL via RESPIRATORY_TRACT
  Filled 2018-04-22 (×2): qty 3

## 2018-04-22 NOTE — Progress Notes (Signed)
PROGRESS NOTE                                                                                                                                                                                                             Patient Demographics:    Edward [name], is a 83 y.o. male, DOB - October 01, 1935, YQM:[id number]  Admit date - 04/17/2018   Admitting Physician Phillips Grout, MD  Outpatient Primary MD for the patient is [name]  LOS - 5  Outpatient Specialists: none  Chief Complaint  Patient presents with  . Shortness of Breath       Brief Narrative  83 year old male with chronic respiratory failure on 2-3 L home O2 with frequent hospitalization, chronic diastolic CHF, uncontrolled type 2 diabetes mellitus, paroxysmal A. fib on Eliquis, microcytic anemia, presented to the ED with increasing shortness of breath, wheezing and some leg swellings for past few days.  In the ED he was found to have O2 desaturation to 84% on 3 L, improved with nebulizer. Patient admitted for COPD exacerbation with acute on chronic hypoxic respiratory failure and acute on chronic diastolic CHF.   Subjective:   No shortness of breath.  No chest pain.  No abdominal pain.  Assessment  & Plan :    Principal Problem: Acute on chronic respiratory failure with hypoxia ([name]) COPD with acute exacerbation Improving on scheduled DuoNeb, Pulmicort nebs twice daily. On oral prednisone taper. Maintaining O2 sat on 3 L. Continue antitussives.  He is completed a course of azithromycin.   Active Problems: Acute on chronic diastolic CHF Patient was taking oral Lasix on alternate days but in the hospital was treated with 40 mg IV Lasix twice daily.  Diuresing well.  Overall volume status seems to be approaching euvolemia. Lasix changed to lasix 40mg  po daily.   Microcytic anemia Noted for slow dropping H&H in the past few months (hemoglobin was 9.5  few months ago and is 7 this admission).  Denies hematochezia or melena.  He is on chronic anticoagulants and baby aspirin.  Iron panel with severe iron deficiency.  Hemoglobin dropped to 6.8 on 7/19.  He was transfused 1 unit of PRBC with improvement of hemoglobin. Eliquis has been held.  Follow up hemoglobin has been stable. Continue to follow CBC to ensure stability  Reports having colonoscopy several  years back. Stool for Hemoccult positive.  GI following and plans are for endoscopic evaluation.  Diabetes mellitus type 2, uncontrolled with hyperglycemia He has been having recurrent episodes of hypoglycemia today.  Lantus dose has been changed from twice daily to daily.  Meal coverage NovoLog has been discontinued.  Sliding scale coverage has been changed from resistant to sensitive.  Continue to follow blood sugars.    AF (paroxysmal atrial fibrillation) ([name]) Rate controlled on beta-blocker.  Eliquis held for drop in H&H.  Acute on chronic CKD (chronic kidney disease), stage III ([name]) Creatinine stablewith diuresis.  We will continue current treatments for today.  He has good urine output.       Code Status : Full code.  Palliative care consulted for goals of care discussion given recurrent hospitalization.  Patient wishes for full scope of treatment.  Family Communication  : None at bedside  Disposition Plan  : Home after GI work up complete  Barriers For Discharge : Active symptoms  Consults  : Palliative care, Gastroenterology   Procedures  : None  DVT Prophylaxis  : Eliquis (held for drop in H&H)  Lab Results  Component Value Date   PLT 357 04/22/2018    Antibiotics  :    Anti-infectives (From admission, onward)   Start     Dose/Rate Route Frequency Ordered Stop   04/18/18 1130  azithromycin (ZITHROMAX) tablet 500 mg     500 mg Oral Daily 04/18/18 1125          Objective:   Vitals:   04/22/18 0625 04/22/18 0721 04/22/18 1357 04/22/18 1500  BP: (!) 133/55    132/65  Pulse: 68   66  Resp: 18   (!) 21  Temp: 98.5 F (36.9 C)   98.3 F (36.8 C)  TempSrc: Oral   Oral  SpO2: 100% 99% 96% 100%  Weight:      Height:        Wt Readings from Last 3 Encounters:  04/17/18 84.2 kg (185 lb 9.6 oz)  04/07/18 87.1 kg (192 lb)  03/20/18 87.4 kg (192 lb 9.6 oz)     Intake/Output Summary (Last 24 hours) at 04/22/2018 1700 Last data filed at 04/22/2018 1500 Gross per 24 hour  Intake 840 ml  Output 1500 ml  Net -660 ml    Physical exam General exam: Alert, awake, oriented x 3 Respiratory system: Clear to auscultation. Respiratory effort normal. Cardiovascular system:RRR. No murmurs, rubs, gallops. Gastrointestinal system: Abdomen is nondistended, soft and nontender. No organomegaly or masses felt. Normal bowel sounds heard. Central nervous system: Alert and oriented. No focal neurological deficits. Extremities: No C/C/E, +pedal pulses Skin: No rashes, lesions or ulcers Psychiatry: Judgement and insight appear normal. Mood & affect appropriate.     Data Review:    CBC Recent Labs  Lab 04/17/18 1758 04/18/18 0628 04/19/18 0538 04/20/18 0634 04/21/18 0544 04/22/18 0524  WBC 7.5 5.2 7.8 9.1 8.0 8.3  HGB 7.0* 7.0* 6.8* 7.8* 7.9* 7.9*  HCT 23.4* 23.4* 22.5* 25.0* 25.7* 26.4*  PLT 390 359 419* 373 356 357  MCV 77.0* 76.5* 76.0* 76.9* 77.6* 78.8  MCH 23.0* 22.9* 23.0* 24.0* 23.9* 23.6*  MCHC 29.9* 29.9* 30.2 31.2 30.7 29.9*  RDW 17.8* 17.9* 17.8* 17.3* 17.5* 17.4*  LYMPHSABS 2.2  --   --   --   --   --   MONOABS 0.9  --   --   --   --   --   EOSABS 0.7  --   --   --   --   --  BASOSABS 0.0  --   --   --   --   --     Chemistries  Recent Labs  Lab 04/17/18 1758 04/18/18 0628 04/19/18 0538 04/20/18 0634 04/21/18 0544 04/22/18 0524  NA 132* 132* 132* 135 136 139  K 5.1 5.1 5.0 4.5 4.9 4.3  CL 96* 95* 92* 96* 96* 99  CO2 26 30 32 31 34* 34*  GLUCOSE 276* 299* 296* 68* 111* 62*  BUN 35* 34* 44* 51* 53* 50*  CREATININE 1.31*  1.22 1.46* 1.35* 1.43* 1.33*  CALCIUM 8.7* 8.7* 8.7* 8.8* 8.8* 8.6*  AST 17  --   --   --   --   --   ALT 12  --   --   --   --   --   ALKPHOS 71  --   --   --   --   --   BILITOT 0.4  --   --   --   --   --    ------------------------------------------------------------------------------------------------------------------ No results for input(s): CHOL, HDL, LDLCALC, TRIG, CHOLHDL, LDLDIRECT in the last 72 hours.  Lab Results  Component Value Date   HGBA1C 11.8 (H) 02/19/2018   ------------------------------------------------------------------------------------------------------------------ No results for input(s): TSH, T4TOTAL, T3FREE, THYROIDAB in the last 72 hours.  Invalid input(s): FREET3 ------------------------------------------------------------------------------------------------------------------ No results for input(s): VITAMINB12, FOLATE, FERRITIN, TIBC, IRON, RETICCTPCT in the last 72 hours.  Coagulation profile No results for input(s): INR, PROTIME in the last 168 hours.  No results for input(s): DDIMER in the last 72 hours.  Cardiac Enzymes Recent Labs  Lab 04/17/18 1758  TROPONINI <0.03   ------------------------------------------------------------------------------------------------------------------    Component Value Date/Time   BNP 428.0 (H) 04/17/2018 1758   BNP 121.5 (H) 03/21/2017 1513    Inpatient Medications  Scheduled Meds: . azithromycin  500 mg Oral Daily  . budesonide  0.5 mg Nebulization BID  . cloNIDine  0.1 mg Oral BID  . dextrose  1 ampule Intravenous Once  . diltiazem  240 mg Oral Daily  . furosemide  40 mg Oral Daily  . insulin aspart  0-20 Units Subcutaneous TID WC  . insulin detemir  10 Units Subcutaneous QHS  . ipratropium-albuterol  3 mL Nebulization Q6H  . losartan  100 mg Oral Daily  . metoprolol tartrate  25 mg Oral BID  . pantoprazole  40 mg Oral BID AC  . polyethylene glycol  17 g Oral Daily  . [START ON 04/23/2018]  polyethylene glycol-electrolytes  2,000 mL Oral Once  . polyethylene glycol-electrolytes  2,000 mL Oral Once  . potassium chloride SA  20 mEq Oral QODAY  . pravastatin  80 mg Oral QHS  . predniSONE  20 mg Oral Q breakfast  . sodium chloride flush  3 mL Intravenous Q12H   Continuous Infusions: . sodium chloride     PRN Meds:.sodium chloride, sodium chloride flush  Micro Results No results found for this or any previous visit (from the past 240 hour(s)).  Radiology Reports Dg Chest Port 1 View  Result Date: 04/17/2018 CLINICAL DATA:  Shortness of breath and cough EXAM: PORTABLE CHEST 1 VIEW COMPARISON:  Chest radiograph 02/17/2018 FINDINGS: Unchanged cardiomediastinal contours. There is increased interstitial opacity. No pleural effusion or pneumothorax. IMPRESSION: Increased interstitial opacity, likely mild pulmonary edema. Electronically Signed   By: Ulyses Jarred M.D.   On: 04/17/2018 18:45    Time Spent in minutes:35   Kathie Dike M.D on 04/22/2018 at 5:00 PM  Between 7am to 7pm -  Pager - [phone]  After 7pm go to [email or web address] - password [facility]  [facility] -  Office  [phone]

## 2018-04-22 NOTE — Progress Notes (Addendum)
Inpatient Diabetes Program Recommendations  AACE/ADA: New Consensus Statement on Inpatient Glycemic Control (2015)  Target Ranges:  Prepandial:   less than 140 mg/dL      Peak postprandial:   less than 180 mg/dL (1-2 hours)      Critically ill patients:  140 - 180 mg/dL   Lab Results  Component Value Date   GLUCAP 145 (H) 04/22/2018   HGBA1C 11.8 (H) 02/19/2018    Review of Glycemic Control Results for [name], Edward [name] (MRN [id number]) as of 04/22/2018 10:07  Ref. Range 04/21/2018 16:29 04/21/2018 22:26 04/22/2018 08:36 04/22/2018 09:08  Glucose-Capillary Latest Ref Range: 70 - 99 mg/dL 248 (H) 151 (H) 42 (LL) 145 (H)   Diabetes history: Type 2 DM Outpatient Diabetes medications: Levemir 40 units BID, Humalog 8 units TID Current orders for Inpatient glycemic control: Lantus 20 units QHS, Novolog 0-20 units TID, Prednisone 20 units QAM  Inpatient Diabetes Program Recommendations:    Noted hypoglycemic episode this AM of 42 mg/dL and subsequent changes to insulin regimen.  Would recommend further decreasing correction to Novolog 0-9 units TID.    Thanks, Bronson Curb, MSN, RNC-OB Diabetes Coordinator [phone] (8a-5p)

## 2018-04-22 NOTE — H&P (View-Only) (Signed)
Referring Provider: [facility] Primary Care Physician:  Orlena Sheldon, PA-C Primary Gastroenterologist:  Dr. Oneida Alar (previously unassigned)  Date of Admission: 04/17/18 Date of Consultation: 04/22/18  Reason for Consultation:  Microcytic anemia, heme+ stools  HPI:  Edward [name] is a 83 y.o. male with a past medical history of COPD (O2 dependent), multiple hospitalizations, chronic diastolic CHF, uncontrolled DM 2, paroxysmal AFib on Eliquis anticoagulation was admitted for COPD exacerbation, acute on chronic hypoxic respiratory failure, and acute on chronic diastolic CHF exacerbation. During admission it was noted that he has had a slow decline in H/H from 9.5 a few months ago to 6.8 during this admission. He was heme+ on stool sample. He is on Eliquis and ASA 81 mg for AFib. His iron panel showed significant iron deficiency and he was transfused 1 unit PRBC. Subsequent H/H has been stable. He told the hospitalist he had a colonoscopy "years back." He was made clear liquid diet and GI was consulted.  No history of colonoscopy of EGD found in our system.  Today he states he did see some mild hematochezia about 3 months ago.  No other hematochezia or melena since.  No hematemesis.  Denies abdominal pain, nausea, vomiting.  Has rare/intermittent GERD symptoms which are not overtly bothersome.  Denies any history of hemorrhoids.  Last colonoscopy "a long time ago" which she agrees is been over 10 years, at least.  Denies dysphasia.  He is anxious to go home but is agreeable to possible procedure tomorrow prior to discharge.  No other GI complaints.  He feels his breathing is much better.  Past Medical History:  Diagnosis Date  . Allergy    Rhinitis  . Bronchitis   . Chronic respiratory failure ([place])   . Colon polyps   . COPD (chronic obstructive pulmonary disease) ([place])   . Diabetes mellitus   . Elevated lipids   . Hypercholesterolemia   . Hypertension   . Noncompliance   . On home  O2    2L N/C   . PSA elevation   . Pulmonary fibrosis ([place])   . Vitamin D deficiency     Past Surgical History:  Procedure Laterality Date  . CATARACT EXTRACTION W/PHACO  06/25/2012   Procedure: CATARACT EXTRACTION PHACO AND INTRAOCULAR LENS PLACEMENT (IOC);  Surgeon: Elta Guadeloupe T. Gershon Crane, MD;  Location: [facility];  Service: Ophthalmology;  Laterality: Left;  CDE=19.01  . CATARACT EXTRACTION W/PHACO  07/09/2012   Procedure: CATARACT EXTRACTION PHACO AND INTRAOCULAR LENS PLACEMENT (IOC);  Surgeon: Elta Guadeloupe T. Gershon Crane, MD;  Location: [facility];  Service: Ophthalmology;  Laterality: Right;  CDE: 20.09    Prior to Admission medications   Medication Sig Start Date End Date Taking? Authorizing Provider  albuterol ([name];VENTOLIN HFA) 108 (90 Base) MCG/ACT inhaler Inhale 2 puffs into the lungs every 6 (six) hours as needed for wheezing or shortness of breath. 04/11/18  Yes Orlena Sheldon, PA-C  apixaban (ELIQUIS) 2.5 MG TABS tablet Take 1 tablet (2.5 mg total) by mouth 2 (two) times daily. 04/10/18  Yes Orlena Sheldon, PA-C  aspirin EC 81 MG tablet Take 81 mg by mouth daily.   Yes [provider]  budesonide (PULMICORT) 0.5 MG/2ML nebulizer solution Take 2 mLs (0.5 mg total) by nebulization 2 (two) times daily. 02/05/18  Yes Dena Billet B, PA-C  cloNIDine (CATAPRES) 0.1 MG tablet TAKE 1 TABLET BY MOUTH 2 TIMES A DAY 01/28/18  Yes Dixon, Mary B, PA-C  diltiazem (CARDIZEM CD) 240 MG 24  hr capsule TAKE 1 CAPSULE BY MOUTH DAILY 04/01/18  Yes Dena Billet B, PA-C  furosemide (LASIX) 40 MG tablet Take 1 tablet (40 mg total) by mouth every other day. 02/28/18 02/28/19 Yes Dena Billet B, PA-C  insulin detemir (LEVEMIR) 100 unit/ml SOLN Inject 0.2 mLs (20 Units total) into the skin 2 (two) times daily. 02/20/18  Yes Purohit, [name], MD  insulin lispro (HUMALOG) 100 UNIT/ML KwikPen Junior Inject 0.08 mLs (8 Units total) into the skin 3 (three) times daily. 02/20/18  Yes Purohit, [name], MD  ipratropium (ATROVENT) 0.02  % nebulizer solution Take 2.5 mLs (0.5 mg total) by nebulization 4 (four) times daily. 02/05/18  Yes Dena Billet B, PA-C  losartan (COZAAR) 100 MG tablet TAKE 1 TABLET BY MOUTH DAILY 02/22/18  Yes Dena Billet B, PA-C  metoprolol tartrate (LOPRESSOR) 25 MG tablet TAKE 1 TABLET BY MOUTH TWICE DAILY 02/22/18  Yes Orlena Sheldon, PA-C  omeprazole (PRILOSEC) 20 MG capsule Take 1 capsule (20 mg total) by mouth daily. 02/04/18  Yes Barton Dubois, MD  [facility] DELICA LANCETS 84Z MISC USE TO CHECK BLOOD SUGAR TWICE DAILY AS DIRECTED 02/22/18  Yes Dena Billet B, PA-C  OXYGEN Inhale 3 L into the lungs daily.    Yes [provider]  potassium chloride SA (K-DUR,KLOR-CON) 20 MEQ tablet Take 1 tablet (20 mEq total) by mouth every other day. When he takes lasix 02/28/18  Yes Dena Billet B, PA-C  pravastatin (PRAVACHOL) 80 MG tablet Take 1 tablet (80 mg total) by mouth at bedtime. 02/05/18  Yes Orlena Sheldon, PA-C  predniSONE (DELTASONE) 20 MG tablet Take 2 daily for 2 days then take 1 daily for 3 days. Patient taking differently: Take by mouth every other day.  03/20/18  Yes Orlena Sheldon, PA-C    Current Facility-Administered Medications  Medication Dose Route Frequency Provider Last Rate Last Dose  . 0.9 %  sodium chloride infusion  250 mL Intravenous PRN Phillips Grout, MD      . azithromycin [facility]) tablet 500 mg  500 mg Oral Daily Dhungel, Nishant, MD   500 mg at 04/21/18 1035  . budesonide (PULMICORT) nebulizer solution 0.5 mg  0.5 mg Nebulization BID Derrill Kay A, MD   0.5 mg at 04/22/18 0721  . cloNIDine (CATAPRES) tablet 0.1 mg  0.1 mg Oral BID Derrill Kay A, MD   0.1 mg at 04/21/18 2159  . diltiazem (CARDIZEM CD) 24 hr capsule 240 mg  240 mg Oral Daily Derrill Kay A, MD   240 mg at 04/21/18 1036  . furosemide (LASIX) tablet 40 mg  40 mg Oral Daily Kathie Dike, MD   40 mg at 04/21/18 1238  . insulin aspart (novoLOG) injection 0-20 Units  0-20 Units Subcutaneous TID WC Dhungel, Nishant, MD    7 Units at 04/21/18 1754  . insulin aspart (novoLOG) injection 10 Units  10 Units Subcutaneous TID WC Dhungel, Nishant, MD   10 Units at 04/21/18 1754  . insulin detemir (LEVEMIR) injection 20 Units  20 Units Subcutaneous BID Kathie Dike, MD   20 Units at 04/21/18 2200  . ipratropium-albuterol (DUONEB) 0.5-2.5 (3) MG/3ML nebulizer solution 3 mL  3 mL Nebulization Q6H Kathie Dike, MD   3 mL at 04/22/18 0721  . losartan (COZAAR) tablet 100 mg  100 mg Oral Daily Derrill Kay A, MD   100 mg at 04/21/18 1038  . metoprolol tartrate (LOPRESSOR) tablet 25 mg  25 mg Oral BID Phillips Grout,  MD   25 mg at 04/21/18 2159  . polyethylene glycol (MIRALAX / GLYCOLAX) packet 17 g  17 g Oral Daily Dhungel, Nishant, MD   17 g at 04/21/18 1035  . potassium chloride SA (K-DUR,KLOR-CON) CR tablet 20 mEq  20 mEq Oral Zachery Conch A, MD   20 mEq at 04/21/18 1037  . pravastatin (PRAVACHOL) tablet 80 mg  80 mg Oral QHS Phillips Grout, MD   80 mg at 04/21/18 2159  . predniSONE (DELTASONE) tablet 20 mg  20 mg Oral Q breakfast Kathie Dike, MD   20 mg at 04/21/18 1035  . sodium chloride flush (NS) 0.9 % injection 3 mL  3 mL Intravenous Q12H Derrill Kay A, MD   3 mL at 04/21/18 2203  . sodium chloride flush (NS) 0.9 % injection 3 mL  3 mL Intravenous PRN Phillips Grout, MD        Allergies as of 04/17/2018 - Review Complete 04/17/2018  Allergen Reaction Noted  . Ace inhibitors Other (See Comments) 10/23/2013    Family History  Problem Relation Age of Onset  . Heart disease Mother   . CAD Other   . Diabetes Other     Social History   Socioeconomic History  . Marital status: Married    Spouse name: Not on file  . Number of children: Not on file  . Years of education: Not on file  . Highest education level: Not on file  Occupational History  . 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 Financial resource strain: Not on file  . Food insecurity:    Worry: Not  on file    Inability: Not on file  . Transportation needs:    Medical: Not on file    Non-medical: Not on file  Tobacco Use  . Smoking status: Former Smoker    Packs/day: 1.50    Years: 60.00    Pack years: 90.00    Types: Cigarettes    Last attempt to quit: 12/31/2012    Years since quitting: 5.3  . Smokeless tobacco: Never Used  Substance and Sexual Activity  . Alcohol use: No  . Drug use: No  . Sexual activity: Yes    Birth control/protection: None  Lifestyle  . Physical activity:    Days per week: Not on file    Minutes per session: Not on file  . Stress: Not on file  Relationships  . Social connections:    Talks on phone: Not on file    Gets together: Not on file    Attends religious service: Not on file    Active member of club or organization: Not on file    Attends meetings of clubs or organizations: Not on file    Relationship status: Not on file  . Intimate partner violence:    Fear of current or ex partner: Not on file    Emotionally abused: Not on file    Physically abused: Not on file    Forced sexual activity: Not on file  Other Topics Concern  . Not on file  Social History Narrative  . Not on file    Review of Systems: General: Negative for anorexia, weight loss, fever, chills, fatigue, weakness. ENT: Negative for hoarseness, difficulty swallowing , nasal congestion. CV: Negative for chest pain, angina, palpitations, peripheral edema.  Respiratory: Negative for dyspnea at rest, cough, sputum, wheezing. Notes home O2 dependence.  GI: See history of present illness. MS:  Negative for joint pain, low back pain.  Derm: Negative for rash or itching.  Endo: Negative for unusual weight change.  Heme: Negative for bruising or bleeding. Allergy: Negative for rash or hives.  Physical Exam: Vital signs in last 24 hours: Temp:  [98.3 F (36.8 C)-98.5 F (36.9 C)] 98.5 F (36.9 C) (07/22 0625) Pulse Rate:  [63-68] 68 (07/22 0625) Resp:  [18] 18 (07/22  0625) BP: (133-139)/(55-67) 133/55 (07/22 0625) SpO2:  [95 %-100 %] 99 % (07/22 0721) Last BM Date: 04/21/18 General:   Alert,  Well-developed, well-nourished, pleasant and cooperative in NAD Head:  Normocephalic and atraumatic. Eyes:  Sclera clear, no icterus. Conjunctiva pink. Ears:  Normal auditory acuity. Neck:  Supple; no masses or thyromegaly. Lungs:  Clear throughout to auscultation but decreased bilaterally. No wheezes, crackles, or rhonchi. No acute distress. Heart:  Regular rate and rhythm; no murmurs, clicks, rubs, or gallops. Abdomen:  Soft, nontender and nondistended. No masses, hepatosplenomegaly or hernias noted. Normal bowel sounds, without guarding, and without rebound.   Rectal:  Deferred.   Msk:  Symmetrical without gross deformities. Extremities:  Without clubbing or edema. Neurologic:  Alert and  oriented x4;  grossly normal neurologically. Skin:  Intact without significant lesions or rashes. Cervical Nodes:  No significant cervical adenopathy. Psych:  Alert and cooperative. Normal mood and affect.  Intake/Output from previous day: 07/21 0701 - 07/22 0700 In: 720 [P.O.:720] Out: 1075 [Urine:975; Stool:100] Intake/Output this shift: No intake/output data recorded.  Lab Results: Recent Labs    04/20/18 0634 04/21/18 0544 04/22/18 0524  WBC 9.1 8.0 8.3  HGB 7.8* 7.9* 7.9*  HCT 25.0* 25.7* 26.4*  PLT 373 356 357   BMET Recent Labs    04/20/18 0634 04/21/18 0544 04/22/18 0524  NA 135 136 139  K 4.5 4.9 4.3  CL 96* 96* 99  CO2 31 34* 34*  GLUCOSE 68* 111* 62*  BUN 51* 53* 50*  CREATININE 1.35* 1.43* 1.33*  CALCIUM 8.8* 8.8* 8.6*   LFT No results for input(s): PROT, ALBUMIN, AST, ALT, ALKPHOS, BILITOT, BILIDIR, IBILI in the last 72 hours. PT/INR No results for input(s): LABPROT, INR in the last 72 hours. Hepatitis Panel No results for input(s): HEPBSAG, HCVAB, HEPAIGM, HEPBIGM in the last 72 hours. C-Diff No results for input(s): CDIFFTOX in  the last 72 hours.  Studies/Results: No results found.  Impression: 83 year old male being seen by GI for worsening significant IDA (hgb low 6.8, ferritin 7) s/p transfusion of 1 unit PRBC and stabilization of hgb at 7.9. He is on NOAC anticoagulation for AFib, which is being held at the moment due to [place].  He is symptomatically improved related to his pulmonary issues.  No obvious GI bleed noted during admission.  VSS, saturating at 99+% on nasal cannula at home rate of 3 lpm.  He last had his Eliquis on 04/19/2018 11:00 AM.  Heme positive stool with no overt upper or lower GI symptoms.  Differentials are quite wide.  I doubt severe, acute GI bleed due to the patient which his hemoglobin is dropped and his subsequent stability post 1 unit PRBC transfusion.  In the differentials include esophagitis, gastritis, duodenitis, peptic ulcer disease, duodenal ulcer, polyps, hemorrhoids.  Less likely malignancy, inflammatory bowel disease.  He would likely benefit from endoscopic evaluation prior to discharge to assess for safety of resuming anticoagulation.  He is agreeable to endoscopic evaluation if needed. I tentatively discussed EGD and Colonoscopy with him including risks/benefits.  Consider proceeding  with colonoscopy +/- EGD with Dr. Oneida Alar in the near future. The risks, benefits, and alternatives have been discussed in detail with the patient. They state understanding and desire to proceed, if necessary.  The patient is on Eliquis but this is been on hold for approximately 72 hours.  No other anticoagulants, anxiolytics, chronic pain medications, or antidepressants.  Denies alcohol and drug use.  Conscious sedation should be adequate for his procedure.  Plan: 1. TCS +/- EGD, possibly tomorrow (will discuss with [facility]) 2. Monitor H/H closely 3. Monitor for any obvious GI bleed 4. Transfuse as necessary 5. Start bid PPI for now 6. Supportive emasures   Thank you for allowing [place] to participate  in the care of [place], DNP, AGNP-C Adult & Gerontological Nurse Practitioner [facility]   ADDENDUM: Patient has had ER visits with epistaxis (two since 03/07/18). During 03/07/18 visit labs noted "at baseline". During 7/7 visit, clot removed and neo-synephrine spray applied. He does have chronic anemia and no endoscopic evaluation recently. After discussion with Dr. Oneida Alar, will plan for EGD and TCS tomorrow on propofol/MAC.  Discussed with Edward [name] in short stay; scheduled for tomorrow 15:30    LOS: 5 days     04/22/2018, 8:12 AM

## 2018-04-22 NOTE — Plan of Care (Signed)
Pt denies any pain at this time. Will continue to monitor.

## 2018-04-22 NOTE — Consult Note (Addendum)
Referring Provider: [facility] Primary Care Physician:  Orlena Sheldon, PA-C Primary Gastroenterologist:  Dr. Oneida Alar (previously unassigned)  Date of Admission: 04/17/18 Date of Consultation: 04/22/18  Reason for Consultation:  Microcytic anemia, heme+ stools  HPI:  Edward [name] is a 83 y.o. male with a past medical history of COPD (O2 dependent), multiple hospitalizations, chronic diastolic CHF, uncontrolled DM 2, paroxysmal AFib on Eliquis anticoagulation was admitted for COPD exacerbation, acute on chronic hypoxic respiratory failure, and acute on chronic diastolic CHF exacerbation. During admission it was noted that he has had a slow decline in H/H from 9.5 a few months ago to 6.8 during this admission. He was heme+ on stool sample. He is on Eliquis and ASA 81 mg for AFib. His iron panel showed significant iron deficiency and he was transfused 1 unit PRBC. Subsequent H/H has been stable. He told the hospitalist he had a colonoscopy "years back." He was made clear liquid diet and GI was consulted.  No history of colonoscopy of EGD found in our system.  Today he states he did see some mild hematochezia about 3 months ago.  No other hematochezia or melena since.  No hematemesis.  Denies abdominal pain, nausea, vomiting.  Has rare/intermittent GERD symptoms which are not overtly bothersome.  Denies any history of hemorrhoids.  Last colonoscopy "a long time ago" which she agrees is been over 10 years, at least.  Denies dysphasia.  He is anxious to go home but is agreeable to possible procedure tomorrow prior to discharge.  No other GI complaints.  He feels his breathing is much better.  Past Medical History:  Diagnosis Date  . Allergy    Rhinitis  . Bronchitis   . Chronic respiratory failure ([place])   . Colon polyps   . COPD (chronic obstructive pulmonary disease) ([place])   . Diabetes mellitus   . Elevated lipids   . Hypercholesterolemia   . Hypertension   . Noncompliance   . On home  O2    2L N/C   . PSA elevation   . Pulmonary fibrosis ([place])   . Vitamin D deficiency     Past Surgical History:  Procedure Laterality Date  . CATARACT EXTRACTION W/PHACO  06/25/2012   Procedure: CATARACT EXTRACTION PHACO AND INTRAOCULAR LENS PLACEMENT (IOC);  Surgeon: Elta Guadeloupe T. Gershon Crane, MD;  Location: [facility];  Service: Ophthalmology;  Laterality: Left;  CDE=19.01  . CATARACT EXTRACTION W/PHACO  07/09/2012   Procedure: CATARACT EXTRACTION PHACO AND INTRAOCULAR LENS PLACEMENT (IOC);  Surgeon: Elta Guadeloupe T. Gershon Crane, MD;  Location: [facility];  Service: Ophthalmology;  Laterality: Right;  CDE: 20.09    Prior to Admission medications   Medication Sig Start Date End Date Taking? Authorizing Provider  albuterol ([name];VENTOLIN HFA) 108 (90 Base) MCG/ACT inhaler Inhale 2 puffs into the lungs every 6 (six) hours as needed for wheezing or shortness of breath. 04/11/18  Yes Orlena Sheldon, PA-C  apixaban (ELIQUIS) 2.5 MG TABS tablet Take 1 tablet (2.5 mg total) by mouth 2 (two) times daily. 04/10/18  Yes Orlena Sheldon, PA-C  aspirin EC 81 MG tablet Take 81 mg by mouth daily.   Yes [provider]  budesonide (PULMICORT) 0.5 MG/2ML nebulizer solution Take 2 mLs (0.5 mg total) by nebulization 2 (two) times daily. 02/05/18  Yes Dena Billet B, PA-C  cloNIDine (CATAPRES) 0.1 MG tablet TAKE 1 TABLET BY MOUTH 2 TIMES A DAY 01/28/18  Yes Dixon, Mary B, PA-C  diltiazem (CARDIZEM CD) 240 MG 24  hr capsule TAKE 1 CAPSULE BY MOUTH DAILY 04/01/18  Yes Dena Billet B, PA-C  furosemide (LASIX) 40 MG tablet Take 1 tablet (40 mg total) by mouth every other day. 02/28/18 02/28/19 Yes Dena Billet B, PA-C  insulin detemir (LEVEMIR) 100 unit/ml SOLN Inject 0.2 mLs (20 Units total) into the skin 2 (two) times daily. 02/20/18  Yes Purohit, [name], MD  insulin lispro (HUMALOG) 100 UNIT/ML KwikPen Junior Inject 0.08 mLs (8 Units total) into the skin 3 (three) times daily. 02/20/18  Yes Purohit, [name], MD  ipratropium (ATROVENT) 0.02  % nebulizer solution Take 2.5 mLs (0.5 mg total) by nebulization 4 (four) times daily. 02/05/18  Yes Dena Billet B, PA-C  losartan (COZAAR) 100 MG tablet TAKE 1 TABLET BY MOUTH DAILY 02/22/18  Yes Dena Billet B, PA-C  metoprolol tartrate (LOPRESSOR) 25 MG tablet TAKE 1 TABLET BY MOUTH TWICE DAILY 02/22/18  Yes Orlena Sheldon, PA-C  omeprazole (PRILOSEC) 20 MG capsule Take 1 capsule (20 mg total) by mouth daily. 02/04/18  Yes Barton Dubois, MD  [facility] DELICA LANCETS 84Z MISC USE TO CHECK BLOOD SUGAR TWICE DAILY AS DIRECTED 02/22/18  Yes Dena Billet B, PA-C  OXYGEN Inhale 3 L into the lungs daily.    Yes [provider]  potassium chloride SA (K-DUR,KLOR-CON) 20 MEQ tablet Take 1 tablet (20 mEq total) by mouth every other day. When he takes lasix 02/28/18  Yes Dena Billet B, PA-C  pravastatin (PRAVACHOL) 80 MG tablet Take 1 tablet (80 mg total) by mouth at bedtime. 02/05/18  Yes Orlena Sheldon, PA-C  predniSONE (DELTASONE) 20 MG tablet Take 2 daily for 2 days then take 1 daily for 3 days. Patient taking differently: Take by mouth every other day.  03/20/18  Yes Orlena Sheldon, PA-C    Current Facility-Administered Medications  Medication Dose Route Frequency Provider Last Rate Last Dose  . 0.9 %  sodium chloride infusion  250 mL Intravenous PRN Phillips Grout, MD      . azithromycin [facility]) tablet 500 mg  500 mg Oral Daily Dhungel, Nishant, MD   500 mg at 04/21/18 1035  . budesonide (PULMICORT) nebulizer solution 0.5 mg  0.5 mg Nebulization BID Derrill Kay A, MD   0.5 mg at 04/22/18 0721  . cloNIDine (CATAPRES) tablet 0.1 mg  0.1 mg Oral BID Derrill Kay A, MD   0.1 mg at 04/21/18 2159  . diltiazem (CARDIZEM CD) 24 hr capsule 240 mg  240 mg Oral Daily Derrill Kay A, MD   240 mg at 04/21/18 1036  . furosemide (LASIX) tablet 40 mg  40 mg Oral Daily Kathie Dike, MD   40 mg at 04/21/18 1238  . insulin aspart (novoLOG) injection 0-20 Units  0-20 Units Subcutaneous TID WC Dhungel, Nishant, MD    7 Units at 04/21/18 1754  . insulin aspart (novoLOG) injection 10 Units  10 Units Subcutaneous TID WC Dhungel, Nishant, MD   10 Units at 04/21/18 1754  . insulin detemir (LEVEMIR) injection 20 Units  20 Units Subcutaneous BID Kathie Dike, MD   20 Units at 04/21/18 2200  . ipratropium-albuterol (DUONEB) 0.5-2.5 (3) MG/3ML nebulizer solution 3 mL  3 mL Nebulization Q6H Kathie Dike, MD   3 mL at 04/22/18 0721  . losartan (COZAAR) tablet 100 mg  100 mg Oral Daily Derrill Kay A, MD   100 mg at 04/21/18 1038  . metoprolol tartrate (LOPRESSOR) tablet 25 mg  25 mg Oral BID Phillips Grout,  MD   25 mg at 04/21/18 2159  . polyethylene glycol (MIRALAX / GLYCOLAX) packet 17 g  17 g Oral Daily Dhungel, Nishant, MD   17 g at 04/21/18 1035  . potassium chloride SA (K-DUR,KLOR-CON) CR tablet 20 mEq  20 mEq Oral QODAY David, Rachal A, MD   20 mEq at 04/21/18 1037  . pravastatin (PRAVACHOL) tablet 80 mg  80 mg Oral QHS David, Rachal A, MD   80 mg at 04/21/18 2159  . predniSONE (DELTASONE) tablet 20 mg  20 mg Oral Q breakfast Memon, Jehanzeb, MD   20 mg at 04/21/18 1035  . sodium chloride flush (NS) 0.9 % injection 3 mL  3 mL Intravenous Q12H David, Rachal A, MD   3 mL at 04/21/18 2203  . sodium chloride flush (NS) 0.9 % injection 3 mL  3 mL Intravenous PRN David, Rachal A, MD        Allergies as of 04/17/2018 - Review Complete 04/17/2018  Allergen Reaction Noted  . Ace inhibitors Other (See Comments) 10/23/2013    Family History  Problem Relation Age of Onset  . Heart disease Mother   . CAD Other   . Diabetes Other     Social History   Socioeconomic History  . Marital status: Married    Spouse name: Not on file  . Number of children: Not on file  . Years of education: Not on file  . Highest education level: Not on file  Occupational History  . 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 Financial resource strain: Not on file  . Food insecurity:    Worry: Not  on file    Inability: Not on file  . Transportation needs:    Medical: Not on file    Non-medical: Not on file  Tobacco Use  . Smoking status: Former Smoker    Packs/day: 1.50    Years: 60.00    Pack years: 90.00    Types: Cigarettes    Last attempt to quit: 12/31/2012    Years since quitting: 5.3  . Smokeless tobacco: Never Used  Substance and Sexual Activity  . Alcohol use: No  . Drug use: No  . Sexual activity: Yes    Birth control/protection: None  Lifestyle  . Physical activity:    Days per week: Not on file    Minutes per session: Not on file  . Stress: Not on file  Relationships  . Social connections:    Talks on phone: Not on file    Gets together: Not on file    Attends religious service: Not on file    Active member of club or organization: Not on file    Attends meetings of clubs or organizations: Not on file    Relationship status: Not on file  . Intimate partner violence:    Fear of current or ex partner: Not on file    Emotionally abused: Not on file    Physically abused: Not on file    Forced sexual activity: Not on file  Other Topics Concern  . Not on file  Social History Narrative  . Not on file    Review of Systems: General: Negative for anorexia, weight loss, fever, chills, fatigue, weakness. ENT: Negative for hoarseness, difficulty swallowing , nasal congestion. CV: Negative for chest pain, angina, palpitations, peripheral edema.  Respiratory: Negative for dyspnea at rest, cough, sputum, wheezing. Notes home O2 dependence.  GI: See history of present illness. MS:   Negative for joint pain, low back pain.  Derm: Negative for rash or itching.  Endo: Negative for unusual weight change.  Heme: Negative for bruising or bleeding. Allergy: Negative for rash or hives.  Physical Exam: Vital signs in last 24 hours: Temp:  [98.3 F (36.8 C)-98.5 F (36.9 C)] 98.5 F (36.9 C) (07/22 0625) Pulse Rate:  [63-68] 68 (07/22 0625) Resp:  [18] 18 (07/22  0625) BP: (133-139)/(55-67) 133/55 (07/22 0625) SpO2:  [95 %-100 %] 99 % (07/22 0721) Last BM Date: 04/21/18 General:   Alert,  Well-developed, well-nourished, pleasant and cooperative in NAD Head:  Normocephalic and atraumatic. Eyes:  Sclera clear, no icterus. Conjunctiva pink. Ears:  Normal auditory acuity. Neck:  Supple; no masses or thyromegaly. Lungs:  Clear throughout to auscultation but decreased bilaterally. No wheezes, crackles, or rhonchi. No acute distress. Heart:  Regular rate and rhythm; no murmurs, clicks, rubs, or gallops. Abdomen:  Soft, nontender and nondistended. No masses, hepatosplenomegaly or hernias noted. Normal bowel sounds, without guarding, and without rebound.   Rectal:  Deferred.   Msk:  Symmetrical without gross deformities. Extremities:  Without clubbing or edema. Neurologic:  Alert and  oriented x4;  grossly normal neurologically. Skin:  Intact without significant lesions or rashes. Cervical Nodes:  No significant cervical adenopathy. Psych:  Alert and cooperative. Normal mood and affect.  Intake/Output from previous day: 07/21 0701 - 07/22 0700 In: 720 [P.O.:720] Out: 1075 [Urine:975; Stool:100] Intake/Output this shift: No intake/output data recorded.  Lab Results: Recent Labs    04/20/18 0634 04/21/18 0544 04/22/18 0524  WBC 9.1 8.0 8.3  HGB 7.8* 7.9* 7.9*  HCT 25.0* 25.7* 26.4*  PLT 373 356 357   BMET Recent Labs    04/20/18 0634 04/21/18 0544 04/22/18 0524  NA 135 136 139  K 4.5 4.9 4.3  CL 96* 96* 99  CO2 31 34* 34*  GLUCOSE 68* 111* 62*  BUN 51* 53* 50*  CREATININE 1.35* 1.43* 1.33*  CALCIUM 8.8* 8.8* 8.6*   LFT No results for input(s): PROT, ALBUMIN, AST, ALT, ALKPHOS, BILITOT, BILIDIR, IBILI in the last 72 hours. PT/INR No results for input(s): LABPROT, INR in the last 72 hours. Hepatitis Panel No results for input(s): HEPBSAG, HCVAB, HEPAIGM, HEPBIGM in the last 72 hours. C-Diff No results for input(s): CDIFFTOX in  the last 72 hours.  Studies/Results: No results found.  Impression: 83 year old male being seen by GI for worsening significant IDA (hgb low 6.8, ferritin 7) s/p transfusion of 1 unit PRBC and stabilization of hgb at 7.9. He is on NOAC anticoagulation for AFib, which is being held at the moment due to [place].  He is symptomatically improved related to his pulmonary issues.  No obvious GI bleed noted during admission.  VSS, saturating at 99+% on nasal cannula at home rate of 3 lpm.  He last had his Eliquis on 04/19/2018 11:00 AM.  Heme positive stool with no overt upper or lower GI symptoms.  Differentials are quite wide.  I doubt severe, acute GI bleed due to the patient which his hemoglobin is dropped and his subsequent stability post 1 unit PRBC transfusion.  In the differentials include esophagitis, gastritis, duodenitis, peptic ulcer disease, duodenal ulcer, polyps, hemorrhoids.  Less likely malignancy, inflammatory bowel disease.  He would likely benefit from endoscopic evaluation prior to discharge to assess for safety of resuming anticoagulation.  He is agreeable to endoscopic evaluation if needed. I tentatively discussed EGD and Colonoscopy with him including risks/benefits.  Consider proceeding  with colonoscopy +/- EGD with Dr. Oneida Alar in the near future. The risks, benefits, and alternatives have been discussed in detail with the patient. They state understanding and desire to proceed, if necessary.  The patient is on Eliquis but this is been on hold for approximately 72 hours.  No other anticoagulants, anxiolytics, chronic pain medications, or antidepressants.  Denies alcohol and drug use.  Conscious sedation should be adequate for his procedure.  Plan: 1. TCS +/- EGD, possibly tomorrow (will discuss with [facility]) 2. Monitor H/H closely 3. Monitor for any obvious GI bleed 4. Transfuse as necessary 5. Start bid PPI for now 6. Supportive emasures   Thank you for allowing [place] to participate  in the care of [place], DNP, AGNP-C Adult & Gerontological Nurse Practitioner [facility]   ADDENDUM: Patient has had ER visits with epistaxis (two since 03/07/18). During 03/07/18 visit labs noted "at baseline". During 7/7 visit, clot removed and neo-synephrine spray applied. He does have chronic anemia and no endoscopic evaluation recently. After discussion with Dr. Oneida Alar, will plan for EGD and TCS tomorrow on propofol/MAC.  Discussed with [name] in short stay; scheduled for tomorrow 15:30    LOS: 5 days     04/22/2018, 8:12 AM

## 2018-04-22 NOTE — Care Management Important Message (Signed)
Important Message  Patient Details  Name: Edward [name] MRN: [id number] Date of Birth: [birth year]   Medicare Important Message Given:  Yes    [name] 04/22/2018, 11:44 AM

## 2018-04-23 ENCOUNTER — Telehealth: Payer: Self-pay | Admitting: Gastroenterology

## 2018-04-23 ENCOUNTER — Other Ambulatory Visit: Payer: Self-pay | Admitting: Pharmacy Technician

## 2018-04-23 ENCOUNTER — Inpatient Hospital Stay (HOSPITAL_COMMUNITY): Payer: Medicare Other | Admitting: Anesthesiology

## 2018-04-23 ENCOUNTER — Encounter (HOSPITAL_COMMUNITY): Payer: Self-pay | Admitting: *Deleted

## 2018-04-23 ENCOUNTER — Encounter (HOSPITAL_COMMUNITY)
Admission: EM | Disposition: A | Discharge status: Home / Self Care | Payer: Self-pay | Source: Home / Self Care | Attending: Internal Medicine

## 2018-04-23 DIAGNOSIS — K2970 Gastritis, unspecified, without bleeding: Secondary | ICD-10-CM

## 2018-04-23 DIAGNOSIS — K5730 Diverticulosis of large intestine without perforation or abscess without bleeding: Secondary | ICD-10-CM

## 2018-04-23 DIAGNOSIS — D509 Iron deficiency anemia, unspecified: Secondary | ICD-10-CM

## 2018-04-23 DIAGNOSIS — K222 Esophageal obstruction: Secondary | ICD-10-CM

## 2018-04-23 DIAGNOSIS — K2980 Duodenitis without bleeding: Secondary | ICD-10-CM

## 2018-04-23 HISTORY — PX: COLONOSCOPY WITH PROPOFOL: SHX5780

## 2018-04-23 HISTORY — PX: BIOPSY: SHX5522

## 2018-04-23 HISTORY — PX: ESOPHAGOGASTRODUODENOSCOPY (EGD) WITH PROPOFOL: SHX5813

## 2018-04-23 LAB — BASIC METABOLIC PANEL
Anion gap: 6 (ref 5–15)
BUN: 44 mg/dL — AB (ref 8–23)
CHLORIDE: 100 mmol/L (ref 98–111)
CO2: 32 mmol/L (ref 22–32)
Calcium: 8.5 mg/dL — ABNORMAL LOW (ref 8.9–10.3)
Creatinine, Ser: 1.28 mg/dL — ABNORMAL HIGH (ref 0.61–1.24)
GFR calc Af Amer: 59 mL/min — ABNORMAL LOW (ref >60)
GFR calc non Af Amer: 51 mL/min — ABNORMAL LOW (ref >60)
GLUCOSE: 147 mg/dL — AB (ref 70–99)
POTASSIUM: 5.2 mmol/L — AB (ref 3.5–5.1)
Sodium: 138 mmol/L (ref 135–145)

## 2018-04-23 LAB — TYPE AND SCREEN
ABO/RH(D): A NEG
Antibody Screen: NEGATIVE
Unit division: 0
Unit division: 0

## 2018-04-23 LAB — CBC
HEMATOCRIT: 29.2 % — AB (ref 39.0–52.0)
Hemoglobin: 8.8 g/dL — ABNORMAL LOW (ref 13.0–17.0)
MCH: 23.6 pg — AB (ref 26.0–34.0)
MCHC: 30.1 g/dL (ref 30.0–36.0)
MCV: 78.3 fL (ref 78.0–100.0)
Platelets: 362 10*3/uL (ref 150–400)
RBC: 3.73 MIL/uL — ABNORMAL LOW (ref 4.22–5.81)
RDW: 17.7 % — ABNORMAL HIGH (ref 11.5–15.5)
WBC: 12 10*3/uL — ABNORMAL HIGH (ref 4.0–10.5)

## 2018-04-23 LAB — BPAM RBC
BLOOD PRODUCT EXPIRATION DATE: 201907312359
BLOOD PRODUCT EXPIRATION DATE: 201908052359
ISSUE DATE / TIME: 201907191335
UNIT TYPE AND RH: 600
Unit Type and Rh: 600

## 2018-04-23 LAB — GLUCOSE, CAPILLARY
GLUCOSE-CAPILLARY: 133 mg/dL — AB (ref 70–99)
Glucose-Capillary: 123 mg/dL — ABNORMAL HIGH (ref 70–99)
Glucose-Capillary: 108 mg/dL — ABNORMAL HIGH (ref 70–99)

## 2018-04-23 SURGERY — COLONOSCOPY WITH PROPOFOL
Anesthesia: Monitor Anesthesia Care

## 2018-04-23 MED ORDER — APIXABAN 2.5 MG PO TABS: 2.5000 mg | ORAL_TABLET | Freq: Two times a day (BID) | ORAL | 0 refills | Status: DC

## 2018-04-23 MED ORDER — HYDROCORTISONE ACETATE 25 MG RE SUPP: 25.0000 mg | Freq: Two times a day (BID) | RECTAL | 1 refills | Status: DC

## 2018-04-23 MED ORDER — POTASSIUM CHLORIDE CRYS ER 20 MEQ PO TBCR: 20.0000 meq | EXTENDED_RELEASE_TABLET | ORAL | Status: DC

## 2018-04-23 MED ORDER — ASPIRIN EC 81 MG PO TBEC: 81.0000 mg | DELAYED_RELEASE_TABLET | Freq: Every day | ORAL | Status: DC

## 2018-04-23 MED ORDER — PANTOPRAZOLE SODIUM 40 MG PO TBEC: 40.0000 mg | DELAYED_RELEASE_TABLET | Freq: Two times a day (BID) | ORAL | 1 refills | Status: DC

## 2018-04-23 MED ORDER — LACTATED RINGERS IV SOLN
INTRAVENOUS | Status: DC
  Administered 2018-04-23 (×2): via INTRAVENOUS

## 2018-04-23 MED ORDER — FUROSEMIDE 40 MG PO TABS: 40.0000 mg | ORAL_TABLET | Freq: Every day | ORAL | 0 refills | Status: DC

## 2018-04-23 MED ORDER — PROPOFOL 500 MG/50ML IV EMUL
INTRAVENOUS | Status: DC | PRN
  Administered 2018-04-23: 75 ug/kg/min via INTRAVENOUS
  Administered 2018-04-23: 45 ug/kg/min via INTRAVENOUS

## 2018-04-23 MED ORDER — SODIUM CHLORIDE 0.9 % IV SOLN: INTRAVENOUS | Status: DC

## 2018-04-23 MED ORDER — PROPOFOL 10 MG/ML IV BOLUS
INTRAVENOUS | Status: DC | PRN
  Administered 2018-04-23 (×5): 20 mg via INTRAVENOUS

## 2018-04-23 MED ORDER — LIDOCAINE VISCOUS HCL 2 % MT SOLN
OROMUCOSAL | Status: AC
  Filled 2018-04-23: qty 15

## 2018-04-23 MED ORDER — POLYETHYLENE GLYCOL 3350 17 G PO PACK: 17.0000 g | PACK | Freq: Every day | ORAL | 0 refills | Status: DC

## 2018-04-23 NOTE — Patient Outreach (Signed)
See note in chart from 04/24/18.  [name] Carpenter, [place] Management [phone]

## 2018-04-23 NOTE — Care Management Note (Signed)
Case Management Note  Patient Details  Name: Edward [name] MRN: [id number] Date of Birth: [birth year]  Expected Discharge Date:  04/19/18               Expected Discharge Plan:  Home/Self Care  In-House Referral:  NA  Discharge planning Services  CM Consult  Post Acute Care Choice:  NA Choice offered to:  NA  Status of Service:  Completed, signed off  If discussed at Long Length of Stay Meetings, dates discussed:  04/23/18    Sherald Barge, RN 04/23/2018, 2:26 PM

## 2018-04-23 NOTE — Interval H&P Note (Signed)
History and Physical Interval Note:  04/23/2018 11:23 AM  Edward [name]  has presented today for surgery, with the diagnosis of Acute on chronic anemia, home hematochezia, heme+ stools  The various methods of treatment have been discussed with the patient and family. After consideration of risks, benefits and other options for treatment, the patient has consented to  Procedure(s): COLONOSCOPY WITH PROPOFOL (N/A) ESOPHAGOGASTRODUODENOSCOPY (EGD) WITH PROPOFOL (N/A) as a surgical intervention .  The patient's history has been reviewed, patient examined, no change in status, stable for surgery.  I have reviewed the patient's chart and labs.  Questions were answered to the patient's satisfaction.     [employer]

## 2018-04-23 NOTE — Anesthesia Postprocedure Evaluation (Signed)
Anesthesia Post Note  Patient: Edward [name]  Procedure(s) Performed: COLONOSCOPY WITH PROPOFOL (N/A ) ESOPHAGOGASTRODUODENOSCOPY (EGD) WITH PROPOFOL (N/A ) BIOPSY  Patient location during evaluation: PACU Anesthesia Type: MAC Level of consciousness: awake and alert and patient cooperative Pain management: satisfactory to patient Vital Signs Assessment: post-procedure vital signs reviewed and stable Respiratory status: spontaneous breathing Cardiovascular status: stable Postop Assessment: no apparent nausea or vomiting Anesthetic complications: no     Last Vitals:  Vitals:   04/23/18 1125 04/23/18 1315  BP: 134/63 127/65  Pulse:  72  Resp: 14 19  Temp:  (P) 36.6 C  SpO2: 100% 100%    Last Pain:  Vitals:   04/23/18 1200  TempSrc:   PainSc: 0-No pain                 Xitlaly Ault

## 2018-04-23 NOTE — Discharge Summary (Signed)
Physician Discharge Summary  Edward [name] WUJ:[id number] DOB: 1935/08/15 DOA: 04/17/2018  PCP: Orlena Sheldon, PA-C  Admit date: 04/17/2018 Discharge date: 04/23/2018  Admitted From: Home Disposition: Home  Recommendations for Outpatient Follow-up:  1. Follow up with PCP in 1-2 weeks 2. Please obtain BMP/CBC in one week 3. Patient will follow-up with GI as an outpatient  Home Health: Patient was recommended home health, but has refused Equipment/Devices:  Discharge Condition: Stable CODE STATUS: Full code Diet recommendation: Heart healthy, carb modified  Brief/Interim Summary: 83 y/o Male with a history of chronic respiratory failure on 2 to 3 L of oxygen who has had frequent hospitalizations, chronic diastolic congestive heart failure, diabetes, paroxysmal atrial fibrillation on anticoagulation, microcytic anemia, presents to the emergency room with shortness of breath, wheezing and leg swelling.  Found to have COPD exacerbation as well as acute on chronic diastolic congestive heart failure.  Is complicated by development of worsening anemia requiring transfusion of PRBCs.  He was found to have heme positive stools.  He was seen by GI and underwent EGD/colonoscopy.  It was felt that his bleeding may be related to internal hemorrhoids.  Discharge Diagnoses:  Principal Problem:   COPD exacerbation ([place]) Active Problems:   Hypertension   Acute on chronic respiratory failure with hypoxia ([name])   Chronic diastolic CHF (congestive heart failure) ([name])   AF (paroxysmal atrial fibrillation) ([name])   CKD (chronic kidney disease), stage III ([name])   Severe anemia   Goals of care, counseling/discussion   Palliative care by specialist   DNR (do not resuscitate) discussion   Acute renal failure with acute tubular necrosis superimposed on stage 3 chronic kidney disease ([place])   Palliative care encounter   Respiratory distress   Hematochezia  1. Acute on chronic respiratory failure with  hypoxia.  Related to COPD exacerbation/CHF exacerbation.  Patient has been weaned down to his baseline requirement of 2 to 3 L.  He is breathing comfortably at this time and is able to ambulate. 2. COPD exacerbation.  Treated with intravenous steroids.  He was subsequently transitioned to prednisone taper.  He is completed antibiotics and prednisone course in the hospital.  Continue bronchodilators as needed. 3. Acute on chronic diastolic congestive heart failure.  Treated with intravenous Lasix.  Overall volume status has improved and is now approaching euvolemia.  Lasix has been changed to 40 mg p.o. daily.  Renal function appears to be stable. 4. Iron deficiency anemia.  Patient found to have significant iron deficiency anemia with hemoglobin dropping down to 6.8.  He was transfused 1 unit of PRBC with improvement of hemoglobin which has since been stable.  Stool was found to be Hemoccult positive.  Since he is on Eliquis, he was seen by gastroenterology and underwent EGD/colonoscopy.  It was felt that his anemia may be related to bleeding from internal hemorrhoids.  He is been placed on MiraLAX as well as Anusol suppositories.  He was noted to have some gastritis and will be continued on PPI.  Anticoagulation be restarted on 7/24 5. Paroxysmal atrial fibrillation.  Anticoagulated with Eliquis.  Rate controlled on beta-blockers. 6. Acute on chronic kidney disease stage III.  Creatinine has been stable with diuresis.  He maintains good urine output. 7. Goals of care.  Patient has multiple comorbidities and has had repeated admissions to the hospital.  He was seen by palliative care to discuss goals of cares.  He wants to continue as a full code with all available treatments.  Discharge  Instructions  Discharge Instructions    AMB Referral to [facility]   Complete by:  As directed    Please assign to [facility] due to multiple hospitalizations, CHF, DM, COPD. PCP office listed as doing  toc. Currently at [facility]. Verbal consent for [place] follow up. Already active with [place] team. Please call with questions. Marthenia Rolling, [place], RN,BSN-THN [facility] [phone]   Reason for consult:  Please assign to [facility] RNCM   Diagnoses of:   COPD/ Pneumonia Heart Failure Diabetes     Expected date of contact:  1-3 days (reserved for hospital discharges)   Diet - low sodium heart healthy   Complete by:  As directed    Increase activity slowly   Complete by:  As directed      Allergies as of 04/23/2018      Reactions   Ace Inhibitors Other (See Comments)   Hyperkalemia--07/23/2013:patient states not familiar with the following allergy      Medication List    STOP taking these medications   omeprazole 20 MG capsule Commonly known as:  PRILOSEC   predniSONE 20 MG tablet Commonly known as:  DELTASONE     TAKE these medications   albuterol 108 (90 Base) MCG/ACT inhaler Commonly known as:  [name];VENTOLIN HFA Inhale 2 puffs into the lungs every 6 (six) hours as needed for wheezing or shortness of breath.   apixaban 2.5 MG Tabs tablet Commonly known as:  ELIQUIS Take 1 tablet (2.5 mg total) by mouth 2 (two) times daily. Restart on 7/24 What changed:  additional instructions   aspirin EC 81 MG tablet Take 1 tablet (81 mg total) by mouth daily. Resume on 7/24 What changed:  additional instructions   budesonide 0.5 MG/2ML nebulizer solution Commonly known as:  PULMICORT Take 2 mLs (0.5 mg total) by nebulization 2 (two) times daily.   cloNIDine 0.1 MG tablet Commonly known as:  CATAPRES TAKE 1 TABLET BY MOUTH 2 TIMES A DAY   diltiazem 240 MG 24 hr capsule Commonly known as:  CARDIZEM CD TAKE 1 CAPSULE BY MOUTH DAILY   furosemide 40 MG tablet Commonly known as:  LASIX Take 1 tablet (40 mg total) by mouth daily. What changed:  when to take this   hydrocortisone 25 MG suppository Commonly known as:   ANUSOL-HC Place 1 suppository (25 mg total) rectally every 12 (twelve) hours.   insulin detemir 100 unit/ml Soln Commonly known as:  LEVEMIR Inject 0.2 mLs (20 Units total) into the skin 2 (two) times daily.   insulin lispro 100 UNIT/ML KwikPen Junior Inject 0.08 mLs (8 Units total) into the skin 3 (three) times daily.   ipratropium 0.02 % nebulizer solution Commonly known as:  ATROVENT Take 2.5 mLs (0.5 mg total) by nebulization 4 (four) times daily.   losartan 100 MG tablet Commonly known as:  COZAAR TAKE 1 TABLET BY MOUTH DAILY   metoprolol tartrate 25 MG tablet Commonly known as:  LOPRESSOR TAKE 1 TABLET BY MOUTH TWICE DAILY   ONETOUCH DELICA LANCETS 76A Misc USE TO CHECK BLOOD SUGAR TWICE DAILY AS DIRECTED   OXYGEN Inhale 3 L into the lungs daily.   pantoprazole 40 MG tablet Commonly known as:  PROTONIX Take 1 tablet (40 mg total) by mouth 2 (two) times daily before a meal.   polyethylene glycol packet Commonly known as:  MIRALAX / GLYCOLAX Take 17 g by mouth daily. Start taking on:  04/24/2018  potassium chloride SA 20 MEQ tablet Commonly known as:  K-DUR,KLOR-CON Take 1 tablet (20 mEq total) by mouth every other day. When he takes lasix   pravastatin 80 MG tablet Commonly known as:  PRAVACHOL Take 1 tablet (80 mg total) by mouth at bedtime.       Allergies  Allergen Reactions  . Ace Inhibitors Other (See Comments)    Hyperkalemia--07/23/2013:patient states not familiar with the following allergy    Consultations:  Gastroenterology  Palliative care   Procedures/Studies: Dg Chest Port 1 View  Result Date: 04/17/2018 CLINICAL DATA:  Shortness of breath and cough EXAM: PORTABLE CHEST 1 VIEW COMPARISON:  Chest radiograph 02/17/2018 FINDINGS: Unchanged cardiomediastinal contours. There is increased interstitial opacity. No pleural effusion or pneumothorax. IMPRESSION: Increased interstitial opacity, likely mild pulmonary edema. Electronically Signed    By: Ulyses Jarred M.D.   On: 04/17/2018 18:45    Colonoscopy: - Preparation of the colon was poor.                           - MODERATE Diverticulosis in the recto-sigmoid                            colon and in the sigmoid colon.                           - There was significant looping of the colon.                           - RECTAL BLEEDING MOST LIKELY DUE TO internal                            hemorrhoids.                           - NO SOURCE FOR ANEMIA IDENTIFIED  Endoscopy: - Anemia most likely due to RECTAL BLEEDING,                            MODERATE Gastritis AND MILD DUODENITIS IN SETTING                            OF ANTICOAGULATION.                           - Duodenitis. Biopsied.     Subjective: No shortness of breath.  No chest pain.  Feels good and wants to go home  Discharge Exam: Vitals:   04/23/18 1125 04/23/18 1315  BP: 134/63 127/65  Pulse:  72  Resp: 14 19  Temp:  97.9 F (36.6 C)  SpO2: 100% 100%   Vitals:   04/23/18 1115 04/23/18 1120 04/23/18 1125 04/23/18 1315  BP: 131/62 134/63 134/63 127/65  Pulse:    72  Resp: 17 15 14 19   Temp:    97.9 F (36.6 C)  TempSrc:      SpO2: 99% 99% 100% 100%  Weight:      Height:        General: Pt is alert, awake, not in acute distress Cardiovascular: RRR, S1/S2 +, no rubs, no gallops Respiratory: CTA bilaterally, no wheezing,  no rhonchi Abdominal: Soft, NT, ND, bowel sounds + Extremities: no edema, no cyanosis    The results of significant diagnostics from this hospitalization (including imaging, microbiology, ancillary and laboratory) are listed below for reference.     Microbiology: No results found for this or any previous visit (from the past 240 hour(s)).   Labs: BNP (last 3 results) Recent Labs    02/01/18 1808 02/17/18 0045 04/17/18 1758  BNP 202.0* 169.0* 983.3*   Basic Metabolic Panel: Recent Labs  Lab 04/19/18 0538 04/20/18 0634 04/21/18 0544 04/22/18 0524 04/23/18 0548   NA 132* 135 136 139 138  K 5.0 4.5 4.9 4.3 5.2*  CL 92* 96* 96* 99 100  CO2 32 31 34* 34* 32  GLUCOSE 296* 68* 111* 62* 147*  BUN 44* 51* 53* 50* 44*  CREATININE 1.46* 1.35* 1.43* 1.33* 1.28*  CALCIUM 8.7* 8.8* 8.8* 8.6* 8.5*   Liver Function Tests: Recent Labs  Lab 04/17/18 1758  AST 17  ALT 12  ALKPHOS 71  BILITOT 0.4  PROT 7.0  ALBUMIN 3.8   No results for input(s): LIPASE, AMYLASE in the last 168 hours. No results for input(s): AMMONIA in the last 168 hours. CBC: Recent Labs  Lab 04/17/18 1758  04/19/18 0538 04/20/18 0634 04/21/18 0544 04/22/18 0524 04/23/18 0548  WBC 7.5   < > 7.8 9.1 8.0 8.3 12.0*  NEUTROABS 3.7  --   --   --   --   --   --   HGB 7.0*   < > 6.8* 7.8* 7.9* 7.9* 8.8*  HCT 23.4*   < > 22.5* 25.0* 25.7* 26.4* 29.2*  MCV 77.0*   < > 76.0* 76.9* 77.6* 78.8 78.3  PLT 390   < > 419* 373 356 357 362   < > = values in this interval not displayed.   Cardiac Enzymes: Recent Labs  Lab 04/17/18 1758  TROPONINI <0.03   BNP: Invalid input(s): POCBNP CBG: Recent Labs  Lab 04/22/18 1715 04/22/18 2248 04/23/18 0910 04/23/18 1114 04/23/18 1315  GLUCAP 154* 275* 123* 108* 133*   D-Dimer No results for input(s): DDIMER in the last 72 hours. Hgb A1c No results for input(s): HGBA1C in the last 72 hours. Lipid Profile No results for input(s): CHOL, HDL, LDLCALC, TRIG, CHOLHDL, LDLDIRECT in the last 72 hours. Thyroid function studies No results for input(s): TSH, T4TOTAL, T3FREE, THYROIDAB in the last 72 hours.  Invalid input(s): FREET3 Anemia work up No results for input(s): VITAMINB12, FOLATE, FERRITIN, TIBC, IRON, RETICCTPCT in the last 72 hours. Urinalysis    Component Value Date/Time   COLORURINE STRAW (A) 02/06/2018 1142   APPEARANCEUR CLEAR 02/06/2018 1142   LABSPEC 1.013 02/06/2018 1142   PHURINE 6.0 02/06/2018 1142   GLUCOSEU >=500 (A) 02/06/2018 1142   HGBUR SMALL (A) 02/06/2018 1142   BILIRUBINUR NEGATIVE 02/06/2018 1142    KETONESUR NEGATIVE 02/06/2018 1142   PROTEINUR NEGATIVE 02/06/2018 1142   NITRITE NEGATIVE 02/06/2018 1142   LEUKOCYTESUR NEGATIVE 02/06/2018 1142   Sepsis Labs Invalid input(s): PROCALCITONIN,  WBC,  LACTICIDVEN Microbiology No results found for this or any previous visit (from the past 240 hour(s)).   Time coordinating discharge: 75mins  SIGNED:   Kathie Dike, MD  [facility] 04/23/2018, 2:28 PM Pager   If 7PM-7AM, please contact night-coverage [email or web address] Password [email or web address]

## 2018-04-23 NOTE — Op Note (Signed)
[facility] Patient Name: Edward [name] Procedure Date: 04/23/2018 12:46 PM MRN: [id number] Date of Birth: [birth year] Attending MD: Barney Drain MD, MD CSN: [id number] Age: 83 Admit Type: Outpatient Procedure:                Upper GI endoscopy with COLD FORCEPS BIOPSY Indications:              Iron deficiency anemia Providers:                Barney Drain MD, MD, Otis Peak B. Sharon Seller, RN, Ralene Bathe, Technician, Randa Spike, Technician Referring MD:              Medicines:                Propofol per Anesthesia Complications:            No immediate complications. Estimated Blood Loss:     Estimated blood loss was minimal. Procedure:                Pre-Anesthesia Assessment:                           - Prior to the procedure, a History and Physical                            was performed, and patient medications and                            allergies were reviewed. The patient's tolerance of                            previous anesthesia was also reviewed. The risks                            and benefits of the procedure and the sedation                            options and risks were discussed with the patient.                            All questions were answered, and informed consent                            was obtained. Prior Anticoagulants: The patient has                            taken Eliquis (apixaban), last dose was 8 days                            prior to procedure. ASA Grade Assessment: III - A                            patient with severe systemic disease. After  reviewing the risks and benefits, the patient was                            deemed in satisfactory condition to undergo the                            procedure. After obtaining informed consent, the                            endoscope was passed under direct vision.                            Throughout the procedure, the patient's blood                             pressure, pulse, and oxygen saturations were                            monitored continuously. The [id number]-[id number] ([id number])                            scope was introduced through the mouth, and                            advanced to the second part of duodenum. The upper                            GI endoscopy was somewhat difficult due to                            excessive bleeding. Successful completion of the                            procedure was aided by controlling the bleeding.                            The patient tolerated the procedure fairly well. Scope In: 12:47:53 PM Scope Out: 1:00:52 PM Total Procedure Duration: 0 hours 12 minutes 59 seconds  Findings:      One benign-appearing, intrinsic moderate (circumferential scarring or       stenosis; an endoscope may pass) stenosis was found. This stenosis       measured 1.4 cm (inner diameter). The stenosis was traversed.      Diffuse moderate inflammation characterized by congestion (edema),       erosions and erythema was found in the entire examined stomach.       Biopsies(2: BDY, 3: ANTRUM) were taken with a cold forceps for       Helicobacter pylori testing. Coagulation for hemostasis of bleeding       caused by the procedure using bipolar probe was successful.      Patchy mild inflammation characterized by congestion (edema) and       erythema was found in the duodenal bulb. Biopsies(2) for histology were       taken with a cold forceps for evaluation of celiac disease.      The  second portion of the duodenum was normal. Biopsies(4) for histology       were taken with a cold forceps for evaluation of celiac disease. Impression:               - Benign-appearing esophageal stenosis.                           - Anemia most likely due to RECTAL BLEEDING,                            MODERATE Gastritis AND MILD DUODENITIS IN SETTING                            OF ANTICOAGULATION.                            - Duodenitis. Biopsied. Moderate Sedation:      Per Anesthesia Care Recommendation:           - Return patient to hospital ward.                           - Cardiac diet and diabetic (ADA) diet.                           - Continue present medications. ASSESS BENEFITS V.                            RISKS OF ANTICOAGULATION. IF DECISION MADE TO                            RE-START ELIQUIS, OK TO DO SO ON JUL 24. NEEDS CBC                            EVERY 4 WEEKS UNTIL COLONONOSOPY IS COMPLETED.                           - Await pathology results.                           - Return to my office in 3 months. Procedure Code(s):        --- Professional ---                           [phone], Esophagogastroduodenoscopy, flexible,                            transoral; with biopsy, single or multiple Diagnosis Code(s):        --- Professional ---                           K22.2, Esophageal obstruction                           K29.70, Gastritis, unspecified, without bleeding  K29.80, Duodenitis without bleeding                           D50.9, Iron deficiency anemia, unspecified CPT copyright 2017 American Medical Association. All rights reserved. The codes documented in this report are preliminary and upon coder review may  be revised to meet current compliance requirements. Barney Drain, MD Barney Drain MD, MD 04/23/2018 1:25:11 PM This report has been signed electronically. Number of Addenda: 0

## 2018-04-23 NOTE — Op Note (Signed)
[facility] Patient Name: Edward [name] Procedure Date: 04/23/2018 11:51 AM MRN: [id number] Date of Birth: [birth year] Attending MD: Barney Drain MD, MD CSN: [id number] Age: 83 Admit Type: Inpatient Procedure:                Colonoscopy, INCOMPLETE DUE TO LOOPING/POOR BOWEL                            PREP Indications:              Hematochezia, Iron deficiency anemia Providers:                Barney Drain MD, MD, Tammy Vaught, RN, Jeanann Lewandowsky.                            Sharon Seller, RN, Randa Spike, Technician Referring MD:             Lonie Peak. [place] Medicines:                Propofol per Anesthesia Complications:            No immediate complications. Estimated Blood Loss:     Estimated blood loss: none. Procedure:                Pre-Anesthesia Assessment:                           - Prior to the procedure, a History and Physical                            was performed, and patient medications and                            allergies were reviewed. The patient's tolerance of                            previous anesthesia was also reviewed. The risks                            and benefits of the procedure and the sedation                            options and risks were discussed with the patient.                            All questions were answered, and informed consent                            was obtained. Prior Anticoagulants: The patient has                            taken Eliquis (apixaban), last dose was 8 days                            prior to procedure. ASA Grade Assessment: III - A  patient with severe systemic disease. After                            reviewing the risks and benefits, the patient was                            deemed in satisfactory condition to undergo the                            procedure. After obtaining informed consent, the                            colonoscope was passed under direct vision.         Throughout the procedure, the patient's blood                            pressure, pulse, and oxygen saturations were                            monitored continuously. The [id number]-[id number] ([id number])                            scope was introduced through the anus and advanced                            to the the ascending colon. The colonoscopy was                            technically difficult and complex due to inadequate                            bowel prep and significant looping. Successful                            completion of the procedure was aided by increasing                            the dose of sedation medication, changing the                            patient to a supine position, using manual                            pressure, withdrawing and reinserting the scope,                            straightening and shortening the scope to obtain                            bowel loop reduction and COLOWRAP. The patient                            tolerated the procedure well. The quality of the  bowel preparation was poor. The ileocecal valve and                            the rectum were photographed. Scope In: 12:09:12 PM Scope Out: 12:42:41 PM Total Procedure Duration: 0 hours 33 minutes 29 seconds  Findings:      Multiple small and large-mouthed diverticula were found in the       recto-sigmoid colon and sigmoid colon.      The recto-sigmoid colon, sigmoid colon, descending colon and transverse       colon revealed significantly excessive looping.      External and internal hemorrhoids were found during perianal exam. The       hemorrhoids were large. Impression:               - Preparation of the colon was poor.                           - MODERATE Diverticulosis in the recto-sigmoid                            colon and in the sigmoid colon.                           - There was significant looping of the colon.                            - RECTAL BLEEDING MOST LIKELY DUE TO internal                            hemorrhoids.                           - NO SOURCE FOR ANEMIA IDENTIFIED Moderate Sedation:      Per Anesthesia Care Recommendation:           - Return patient to hospital ward for ongoing care.                           - Cardiac diet and diabetic (ADA) diet.                           - Continue present medications.                           - Repeat colonoscopy at the next available                            appointment because the bowel preparation was poor.                           - Return to GI office in 3 months.                           - Refer to a [facility] gastroenterologist at the next  available appointment FOR TCS W/ MAC/OVERTUBE. Procedure Code(s):        --- Professional ---                           [phone], 76, Colonoscopy, flexible; diagnostic,                            including collection of specimen(s) by brushing or                            washing, when performed (separate procedure) Diagnosis Code(s):        --- Professional ---                           K64.8, Other hemorrhoids                           K92.1, Melena (includes Hematochezia)                           D50.9, Iron deficiency anemia, unspecified                           K57.30, Diverticulosis of large intestine without                            perforation or abscess without bleeding CPT copyright 2017 American Medical Association. All rights reserved. The codes documented in this report are preliminary and upon coder review may  be revised to meet current compliance requirements. Barney Drain, MD Barney Drain MD, MD 04/23/2018 1:17:16 PM This report has been signed electronically. Number of Addenda: 0

## 2018-04-23 NOTE — Progress Notes (Signed)
IV removed, 2x2 gauze and paper tape applied to site, patient tolerated well.  Reviewed AVS with patient who verbalized understanding.  Patient awaiting son's arrival to transport home.

## 2018-04-23 NOTE — Progress Notes (Signed)
Patient sleeping on right side, no distress noted. Call bell within reach, bed in low position. Will continue to monitor.

## 2018-04-23 NOTE — Telephone Encounter (Signed)
PT NEEDS TCS W/ MAC/OVERTUBE AT [facility] ASAP, Dx: [facility], REQUIRES ELIQUIS FOR AFIB.

## 2018-04-23 NOTE — Anesthesia Preprocedure Evaluation (Addendum)
Anesthesia Evaluation  Patient identified by MRN, date of birth, ID band Patient awake    Reviewed: Allergy & Precautions, H&P , NPO status , Patient's Chart, lab work & pertinent test results, reviewed documented beta blocker date and time   Airway Mallampati: II  TM Distance: >3 FB Neck ROM: limited    Dental no notable dental hx. (+) Edentulous Upper, Edentulous Lower   Pulmonary neg pulmonary ROS, pneumonia, COPD,  COPD inhaler and oxygen dependent, former smoker,    Pulmonary exam normal  + wheezing      Cardiovascular Exercise Tolerance: Good hypertension, +CHF  negative cardio ROS  + dysrhythmias Atrial Fibrillation  Rhythm:regular Rate:Normal     Neuro/Psych negative neurological ROS  negative psych ROS   GI/Hepatic negative GI ROS, Neg liver ROS, GERD  ,  Endo/Other  negative endocrine ROSdiabetes  Renal/GU Renal diseasenegative Renal ROS  negative genitourinary   Musculoskeletal   Abdominal   Peds  Hematology negative hematology ROS (+) anemia ,   Anesthesia Other Findings   Reproductive/Obstetrics negative OB ROS                           Anesthesia Physical Anesthesia Plan  ASA: IV  Anesthesia Plan: MAC   Post-op Pain Management:    Induction:   PONV Risk Score and Plan:   Airway Management Planned:   Additional Equipment:   Intra-op Plan:   Post-operative Plan:   Informed Consent: I have reviewed the patients History and Physical, chart, labs and discussed the procedure including the risks, benefits and alternatives for the proposed anesthesia with the patient or authorized representative who has indicated his/her understanding and acceptance.   Dental Advisory Given  Plan Discussed with: CRNA  Anesthesia Plan Comments:         Anesthesia Quick Evaluation

## 2018-04-23 NOTE — Anesthesia Procedure Notes (Signed)
Procedure Name: MAC Date/Time: 04/23/2018 11:56 AM Performed by: Vista Deck, CRNA Pre-anesthesia Checklist: Patient identified, Emergency Drugs available, Suction available, Timeout performed and Patient being monitored Patient Re-evaluated:Patient Re-evaluated prior to induction Oxygen Delivery Method: Non-rebreather mask

## 2018-04-23 NOTE — Transfer of Care (Signed)
Immediate Anesthesia Transfer of Care Note  Patient: Edward [name]  Procedure(s) Performed: COLONOSCOPY WITH PROPOFOL (N/A ) ESOPHAGOGASTRODUODENOSCOPY (EGD) WITH PROPOFOL (N/A ) BIOPSY  Patient Location: PACU  Anesthesia Type:MAC  Level of Consciousness: awake, alert  and patient cooperative  Airway & Oxygen Therapy: Patient Spontanous Breathing and Patient connected to nasal cannula oxygen  Post-op Assessment: Report given to RN and Post -op Vital signs reviewed and stable  Post vital signs: Reviewed and stable  Last Vitals:  Vitals Value Taken Time  BP    Temp    Pulse 69 04/23/2018  1:10 PM  Resp 10 04/23/2018  1:10 PM  SpO2 100 % 04/23/2018  1:10 PM  Vitals shown include unvalidated device data.  Last Pain:  Vitals:   04/23/18 1200  TempSrc:   PainSc: 0-No pain         Complications: No apparent anesthesia complications

## 2018-04-24 ENCOUNTER — Telehealth: Payer: Self-pay | Admitting: Gastroenterology

## 2018-04-24 ENCOUNTER — Other Ambulatory Visit: Payer: Self-pay

## 2018-04-24 ENCOUNTER — Encounter (HOSPITAL_COMMUNITY): Payer: Self-pay | Admitting: Gastroenterology

## 2018-04-24 DIAGNOSIS — D509 Iron deficiency anemia, unspecified: Secondary | ICD-10-CM

## 2018-04-24 DIAGNOSIS — K2940 Chronic atrophic gastritis without bleeding: Secondary | ICD-10-CM

## 2018-04-24 NOTE — Patient Outreach (Addendum)
[facility]) Care Management  04/24/2018  Edward [name] July 17, 1935 [id number]  83year oldmalereferred to [place] Management by for medication assistance. [place] services requested for inhalers and insulin. PMHx includes, but not limited to, hypertension, heart failure, atrial fibrillation, COPD, pulmonary fibrosis, GERD, Type 2 diabetes mellitus, Stage III chronic kidney disease, and hypercholesterolemia.   Successful outreach attempt to the home of Edward [name]. Spoke with his son, whom I met during previous home visits.  HIPAA identifiers verified.  Her reports that his dad is resting and nobody is available that can discuss his father's discharge medications.   I have previously visited the [facility] home on two occasions and completed a medication review.   Outreach call placed to [place], Neldon Labella who is going to schedule a home visit with Edward [name].  She states that she will review his post discharge medications with him.  I updated her on the patient assistance applications that [place] is working on for Edward [name].  Informed her that she can contact [place], Lottie Dawson should questions or needs arise while I am on PAL.   [name], PharmD Clinical Pharmacist [place] [phone]

## 2018-04-24 NOTE — Patient Outreach (Signed)
[facility]) Care Management  04/24/2018  Edward [name] 16-May-1935 [id number]   Received patient documents for patient assistance. Faxed completed and required documents to the following companies: Community education officer Environmental health practitioner and Humalog), [employer] (ELiquis) and [employer] (Ventolin).  Will follow up with companies in 2-14 business days.  [name] [place], [place] Management [phone]

## 2018-04-24 NOTE — Telephone Encounter (Signed)
Referral faxed to [facility] 

## 2018-04-24 NOTE — Telephone Encounter (Signed)
Called patient TO DISCUSS RESULTS OF THE EGD/PLAN WITH PT'S ON: Edward [name]. EXPLAINED RESULTS. PT HAS ATROPHIC GASTRITIS AND CAN'T ABSORB IRON. IN THE SETTING OF GI BLOOD LOSS HE WILL NEED A REFERRAL TO HEMATOLOGY(Dx: ATROPHC GASTRITIS/FEDA) FOR IVFE AND SERIAL CBCs TO KEEP HIS BLOOD COUNT UP AND HOPEFULLY AVOID BLOOD TRANSFUSIONS.   EXPLAINED THAT PT NEEDS TO COMPLETE A TCS AT [facility] AND WHY: POOR PREP/REDUNDANT COLON. THE REFERRAL TO [facility] WAS MADE YESTERDAY.  WILL CALL PCP TO DISCUSS. THE BENEFITS OF ELIQUIS LIKELY OUTWEIGH RISK OF BLEEDING: HEMORRHOIDS OR COLON POLYPS. WOULD RE-START IF NO ACTIVE BLEEDING AND MONITOR CBC. STOP IF PT DEVELOPS LARGE VOLUME GI BLEED RO TRANSFUSION DEPENDENT ANEMIA.  HE VOICED HIS UNDERSTANDING.

## 2018-04-24 NOTE — Patient Outreach (Deleted)
[facility]) Care Management  04/24/2018  Edward [name] [birth year] [id number]   Received patient documents for patient assistance. Faxed completed and required documents to the following companies: Community education officer Environmental health practitioner and Humalog), [employer] (ELiquis) and [employer] (Ventolin).  Will follow up with companies in 2-14 business days.  [name] [place], [place] Management [phone]

## 2018-04-25 ENCOUNTER — Other Ambulatory Visit (HOSPITAL_COMMUNITY): Payer: Self-pay | Admitting: *Deleted

## 2018-04-25 DIAGNOSIS — D500 Iron deficiency anemia secondary to blood loss (chronic): Secondary | ICD-10-CM

## 2018-04-25 NOTE — Addendum Note (Signed)
Addended by: Hassan Rowan on: 04/25/2018 07:28 AM   Modules accepted: Orders

## 2018-04-25 NOTE — Telephone Encounter (Signed)
Referral sent to hematology via Epic. 

## 2018-04-26 ENCOUNTER — Other Ambulatory Visit: Payer: Self-pay | Admitting: Pharmacy Technician

## 2018-04-26 NOTE — Patient Outreach (Signed)
[facility]) Care Management  04/26/2018  Edward [name] May 05, 1935 [id number]   Follow up call to [name] patient assistance to check status of patients application for Ventolin HFA. [name] stated that they need a copy of the 1st page of patient EOB. I faxed the missing document into [employer].  Will follow up in 2-3 business days to check status of application.  [name], [place] Management [phone]

## 2018-04-29 ENCOUNTER — Inpatient Hospital Stay (HOSPITAL_COMMUNITY): Payer: Medicare Other | Attending: Internal Medicine

## 2018-04-29 ENCOUNTER — Other Ambulatory Visit: Payer: Self-pay | Admitting: Pharmacy Technician

## 2018-04-29 DIAGNOSIS — D500 Iron deficiency anemia secondary to blood loss (chronic): Secondary | ICD-10-CM | POA: Diagnosis not present

## 2018-04-29 LAB — COMPREHENSIVE METABOLIC PANEL
ALBUMIN: 3.5 g/dL (ref 3.5–5.0)
ALK PHOS: 59 U/L (ref 38–126)
ALT: 19 U/L (ref 0–44)
AST: 19 U/L (ref 15–41)
Anion gap: 6 (ref 5–15)
BUN: 34 mg/dL — ABNORMAL HIGH (ref 8–23)
CALCIUM: 8.4 mg/dL — AB (ref 8.9–10.3)
CO2: 31 mmol/L (ref 22–32)
CREATININE: 1.42 mg/dL — AB (ref 0.61–1.24)
Chloride: 100 mmol/L (ref 98–111)
GFR calc Af Amer: 52 mL/min — ABNORMAL LOW (ref >60)
GFR calc non Af Amer: 45 mL/min — ABNORMAL LOW (ref >60)
Glucose, Bld: 58 mg/dL — ABNORMAL LOW (ref 70–99)
Potassium: 4.7 mmol/L (ref 3.5–5.1)
SODIUM: 137 mmol/L (ref 135–145)
Total Bilirubin: 0.3 mg/dL (ref 0.3–1.2)
Total Protein: 6.1 g/dL — ABNORMAL LOW (ref 6.5–8.1)

## 2018-04-29 LAB — CBC WITH DIFFERENTIAL/PLATELET
BASOS PCT: 0 %
Basophils Absolute: 0 10*3/uL (ref 0.0–0.1)
EOS ABS: 0.5 10*3/uL (ref 0.0–0.7)
Eosinophils Relative: 7 %
HCT: 27.2 % — ABNORMAL LOW (ref 39.0–52.0)
HEMOGLOBIN: 8.3 g/dL — AB (ref 13.0–17.0)
Lymphocytes Relative: 32 %
Lymphs Abs: 2.1 10*3/uL (ref 0.7–4.0)
MCH: 24 pg — ABNORMAL LOW (ref 26.0–34.0)
MCHC: 30.5 g/dL (ref 30.0–36.0)
MCV: 78.6 fL (ref 78.0–100.0)
Monocytes Absolute: 0.9 10*3/uL (ref 0.1–1.0)
Monocytes Relative: 14 %
NEUTROS PCT: 47 %
Neutro Abs: 3.2 10*3/uL (ref 1.7–7.7)
Platelets: 284 10*3/uL (ref 150–400)
RBC: 3.46 MIL/uL — AB (ref 4.22–5.81)
RDW: 17.8 % — ABNORMAL HIGH (ref 11.5–15.5)
WBC: 6.8 10*3/uL (ref 4.0–10.5)

## 2018-04-29 LAB — IRON AND TIBC
Iron: 63 ug/dL (ref 45–182)
Saturation Ratios: 16 % — ABNORMAL LOW (ref 17.9–39.5)
TIBC: 406 ug/dL (ref 250–450)
UIBC: 343 ug/dL

## 2018-04-29 LAB — FERRITIN: Ferritin: 17 ng/mL — ABNORMAL LOW (ref 24–336)

## 2018-04-29 NOTE — Patient Outreach (Signed)
[facility]) Care Management  04/29/2018  Narada MALIKHI OGAN [birth year] [id number]   Follow up call to [place] patient assistance to check status of patient application for Ventolin. Beverlee Nims stated that patient has been approved as of 07/29 until 10/01/18 and that patient should receive medication in 7-10 business days.  Will follow up with patient in 10-14 days to confirm medication has been received.  [name] [place], [place] Management [phone]

## 2018-04-30 ENCOUNTER — Inpatient Hospital Stay (HOSPITAL_BASED_OUTPATIENT_CLINIC_OR_DEPARTMENT_OTHER): Payer: Medicare Other | Admitting: Internal Medicine

## 2018-04-30 ENCOUNTER — Other Ambulatory Visit: Payer: Self-pay

## 2018-04-30 ENCOUNTER — Encounter (HOSPITAL_COMMUNITY): Payer: Self-pay | Admitting: Internal Medicine

## 2018-04-30 VITALS — BP 116/45 | HR 69 | Temp 98.7°F | Resp 18 | Wt 189.5 lb

## 2018-04-30 DIAGNOSIS — D500 Iron deficiency anemia secondary to blood loss (chronic): Secondary | ICD-10-CM | POA: Diagnosis not present

## 2018-04-30 HISTORY — DX: Iron deficiency anemia secondary to blood loss (chronic): D50.0

## 2018-05-02 ENCOUNTER — Other Ambulatory Visit: Payer: Self-pay | Admitting: Pharmacy Technician

## 2018-05-02 DIAGNOSIS — J449 Chronic obstructive pulmonary disease, unspecified: Secondary | ICD-10-CM | POA: Diagnosis not present

## 2018-05-02 NOTE — Progress Notes (Signed)
Referring physician: Dr. Oneida Alar.  Diagnosis Iron deficiency anemia due to chronic blood loss - Plan: CBC with Differential/Platelet, Comprehensive metabolic panel, Lactate dehydrogenase, Protein electrophoresis, serum, Ferritin, Vitamin B12, Folate  Staging Cancer Staging No matching staging information was found for the patient.  Assessment and Plan:  1.  Iron deficiency anemia.  Labs done 04/29/2018 reviewed with the patient and showed a white count 6.8, hemoglobin 8.3, platelets 284,000.  Ferritin was decreased at 17.  Chemistries were within normal limits with potassium of 4.7.  His creatinine was 1.42 and liver function tests were normal.    I discussed with the patient options of IV iron as he has an intolerance to oral iron due to gastritis.  Side effects of the medications were reviewed.  The patient will receive Injectafer 750 mg IV day 1 and day 8.  He will undergo repeat lab evaluation in 6 to 8 weeks after IV iron completed.  He has undergone endoscopy and colonoscopy on 04/23/2018 with endoscopy showing benign stenosis.  There was also evidence of gastritis and duodenitis with biopsy negative for dysplasia.  Colonoscopy showed diverticulosis but the patient had a poor prep and he is recommended for additional evaluation.  He should follow-up with GI as recommended.  2.  Hypertension.  Blood pressure is 116/45.  Follow-up with PCP.  3.  Renal insufficiency.  Creatinine is noted to be 1.42.  Potassium is 4.7.  4.  Coronary artery disease/atrial fibrillation.  Patient is on Eliquis.  Follow-up with cardiology or PCP as recommended.  HPI: 84 year old male referred by Dr. Oneida Alar for evaluation of anemia.  The patient had labs done on 04/29/2018 that showed a white count 6.8 hemoglobin 8.3 hematocrit 27.2 MCV was 79 platelets 284,000.  Ferritin was decreased at 17.  Chemistries within normal limits with a creatinine of 1.42 liver function tests were normal.  Potassium was 4.7.  He has  undergone endoscopy and colonoscopy on 04/23/2018.  Endoscopy showed benign stenosis.  There was also evidence of gastritis and duodenitis which was biopsied with no evidence of dysplasia noted.  Colonoscopy showed diverticulosis.  There was a question of internal hemorrhoids and the patient had a poor prep.  He has an intolerance to oral iron due to gastritis and patient is seen today for consultation due to iron deficiency.  Problem List Patient Active Problem List   Diagnosis Date Noted  . Iron deficiency anemia due to chronic blood loss [D50.0] 04/30/2018  . Respiratory distress [R06.03]   . Hematochezia [K92.1]   . Goals of care, counseling/discussion [Z71.89]   . Palliative care by specialist [Z51.5]   . DNR (do not resuscitate) discussion [Z71.89]   . Acute renal failure with acute tubular necrosis superimposed on stage 3 chronic kidney disease ([place]) [N17.0, N18.3]   . Palliative care encounter [Z51.5]   . COPD exacerbation ([place]) [J44.1] 04/17/2018  . Acute on chronic congestive heart failure ([place]) [I50.9]   . Severe anemia [D64.9] 02/17/2018  . Bradycardia [R00.1] 02/17/2018  . Hypothermia [T68.XXXA]   . Gastroesophageal reflux disease [K21.9]   . Acute encephalopathy [G93.40] 09/25/2017  . On home O2 [Z99.81] 09/25/2017  . Insomnia [G47.00] 03/28/2017  . CKD (chronic kidney disease), stage III ([place]) [N18.3] 03/07/2017  . AF (paroxysmal atrial fibrillation) ([place]) [I48.0] 03/05/2017  . Chronic diastolic CHF (congestive heart failure) ([place]) [I50.32] 03/04/2017  . HCAP (healthcare-associated pneumonia) [J18.9] 03/04/2017  . Chronic respiratory failure ([place]) [J96.10] 03/04/2017  . Sepsis due to pneumonia ([place]) [J18.9, A41.9] 02/25/2017  .  Constipation [K59.00] 02/25/2017  . Overflow diarrhea/Constipation [R19.7] 02/25/2017  . Non compliance w medication regimen [Z91.14] 12/02/2015  . Hypercholesterolemia [E78.00] 05/12/2014  . Acute on chronic respiratory failure with hypoxia ([place])  [J96.21] 11/17/2013  . Elevated PSA [R97.20] 01/20/2013  . Diabetes mellitus type 2, uncontrolled ([place]) [E11.65]   . COPD (chronic obstructive pulmonary disease) ([place]) [J44.9]   . Hypertension [I10]   . Elevated lipids [E78.5]   . Pulmonary fibrosis ([place]) [J84.10]   . Colon polyps [K63.5]   . Colon polyps [K63.5]     Past Medical History Past Medical History:  Diagnosis Date  . Allergy    Rhinitis  . Bronchitis   . Chronic respiratory failure ([place])   . Colon polyps   . COPD (chronic obstructive pulmonary disease) ([place])   . Diabetes mellitus   . Elevated lipids   . Hypercholesterolemia   . Hypertension   . Iron deficiency anemia due to chronic blood loss 04/30/2018  . Noncompliance   . On home O2    2L N/C   . PSA elevation   . Pulmonary fibrosis ([place])   . Vitamin D deficiency     Past Surgical History Past Surgical History:  Procedure Laterality Date  . BIOPSY  04/23/2018   Procedure: BIOPSY;  Surgeon: Danie Binder, MD;  Location: [facility];  Service: Endoscopy;;  duodenum gastric  . CATARACT EXTRACTION W/PHACO  06/25/2012   Procedure: CATARACT EXTRACTION PHACO AND INTRAOCULAR LENS PLACEMENT (IOC);  Surgeon: Elta Guadeloupe T. Gershon Crane, MD;  Location: [facility] ORS;  Service: Ophthalmology;  Laterality: Left;  CDE=19.01  . CATARACT EXTRACTION W/PHACO  07/09/2012   Procedure: CATARACT EXTRACTION PHACO AND INTRAOCULAR LENS PLACEMENT (IOC);  Surgeon: Elta Guadeloupe T. Gershon Crane, MD;  Location: [facility] ORS;  Service: Ophthalmology;  Laterality: Right;  CDE: 20.09  . COLONOSCOPY WITH PROPOFOL N/A 04/23/2018   Procedure: COLONOSCOPY WITH PROPOFOL;  Surgeon: Danie Binder, MD;  Location: [facility];  Service: Endoscopy;  Laterality: N/A;  . ESOPHAGOGASTRODUODENOSCOPY (EGD) WITH PROPOFOL N/A 04/23/2018   Procedure: ESOPHAGOGASTRODUODENOSCOPY (EGD) WITH PROPOFOL;  Surgeon: Danie Binder, MD;  Location: [facility];  Service: Endoscopy;  Laterality: N/A;    Family History Family History  Problem  Relation Age of Onset  . Heart disease Mother   . CAD Other   . Diabetes Other      Social History  reports that he quit smoking about 5 years ago. His smoking use included cigarettes. He has a 90.00 pack-year smoking history. He has never used smokeless tobacco. He reports that he does not drink alcohol or use drugs.  Medications  Current Outpatient Medications:  .  albuterol (PROVENTIL) (2.5 MG/3ML) 0.083% nebulizer solution, , Disp: , Rfl:  .  apixaban (ELIQUIS) 2.5 MG TABS tablet, Take 1 tablet (2.5 mg total) by mouth 2 (two) times daily. Restart on 7/24, Disp: 120 tablet, Rfl: 0 .  aspirin EC 81 MG tablet, Take 1 tablet (81 mg total) by mouth daily. Resume on 7/24, Disp: , Rfl:  .  budesonide (PULMICORT) 0.5 MG/2ML nebulizer solution, Take 2 mLs (0.5 mg total) by nebulization 2 (two) times daily., Disp: 120 mL, Rfl: 5 .  cloNIDine (CATAPRES) 0.1 MG tablet, TAKE 1 TABLET BY MOUTH 2 TIMES A DAY, Disp: 60 tablet, Rfl: 2 .  diltiazem (CARDIZEM CD) 240 MG 24 hr capsule, TAKE 1 CAPSULE BY MOUTH DAILY, Disp: 30 capsule, Rfl: 2 .  furosemide (LASIX) 40 MG tablet, Take 1 tablet (40 mg total) by mouth  daily., Disp: 90 tablet, Rfl: 0 .  hydrocortisone (ANUSOL-HC) 25 MG suppository, Place 1 suppository (25 mg total) rectally every 12 (twelve) hours., Disp: 12 suppository, Rfl: 1 .  insulin detemir (LEVEMIR) 100 unit/ml SOLN, Inject 0.2 mLs (20 Units total) into the skin 2 (two) times daily., Disp: 10 mL, Rfl: 3 .  insulin lispro (HUMALOG) 100 UNIT/ML KwikPen Junior, Inject 0.08 mLs (8 Units total) into the skin 3 (three) times daily., Disp: 3 mL, Rfl: 3 .  ipratropium (ATROVENT) 0.02 % nebulizer solution, Take 2.5 mLs (0.5 mg total) by nebulization 4 (four) times daily., Disp: 25 mL, Rfl: 12 .  losartan (COZAAR) 100 MG tablet, TAKE 1 TABLET BY MOUTH DAILY, Disp: 30 tablet, Rfl: 0 .  metoprolol tartrate (LOPRESSOR) 25 MG tablet, TAKE 1 TABLET BY MOUTH TWICE DAILY, Disp: 60 tablet, Rfl: 0 .  ONETOUCH  DELICA LANCETS 14N MISC, USE TO CHECK BLOOD SUGAR TWICE DAILY AS DIRECTED, Disp: 100 each, Rfl: 2 .  OXYGEN, Inhale 3 L into the lungs daily. , Disp: , Rfl:  .  pantoprazole (PROTONIX) 40 MG tablet, Take 1 tablet (40 mg total) by mouth 2 (two) times daily before a meal., Disp: 60 tablet, Rfl: 1 .  polyethylene glycol (MIRALAX / GLYCOLAX) packet, Take 17 g by mouth daily., Disp: 14 each, Rfl: 0 .  potassium chloride SA (K-DUR,KLOR-CON) 20 MEQ tablet, Take 1 tablet (20 mEq total) by mouth every other day. When he takes lasix, Disp: 90 tablet, Rfl: 0 .  pravastatin (PRAVACHOL) 80 MG tablet, Take 1 tablet (80 mg total) by mouth at bedtime., Disp: 30 tablet, Rfl: 3  Allergies Ace inhibitors  Review of Systems Review of Systems - Oncology ROS negative other than trouble swallowing.   Physical Exam  Vitals Wt Readings from Last 3 Encounters:  04/30/18 189 lb 8 oz (86 kg)  04/17/18 185 lb 9.6 oz (84.2 kg)  04/07/18 192 lb (87.1 kg)   Temp Readings from Last 3 Encounters:  04/30/18 98.7 F (37.1 C) (Oral)  04/23/18 97.9 F (36.6 C)  04/07/18 98.2 F (36.8 C) (Oral)   BP Readings from Last 3 Encounters:  04/30/18 (!) 116/45  04/23/18 127/65  04/07/18 (!) 155/72   Pulse Readings from Last 3 Encounters:  04/30/18 69  04/23/18 72  04/07/18 87   Constitutional: Well-developed, well-nourished, and in no distress.   HENT: Head: Normocephalic and atraumatic.  Mouth/Throat: No oropharyngeal exudate. Mucosa moist. Eyes: Pupils are equal, round, and reactive to light. Conjunctivae are normal. No scleral icterus.  Neck: Normal range of motion. Neck supple. No JVD present.  Cardiovascular: Normal rate, regular rhythm and normal heart sounds.  Exam reveals no gallop and no friction rub.   No murmur heard. Pulmonary/Chest: Effort normal and breath sounds normal. No respiratory distress. No wheezes.No rales.  Abdominal: Soft. Bowel sounds are normal. No distension. There is no tenderness.  There is no guarding.  Musculoskeletal: No edema or tenderness.  Lymphadenopathy: No cervical, axillary or supraclavicular adenopathy.  Neurological: Alert and oriented to person, place, and time. No cranial nerve deficit.  Skin: Skin is warm and dry. No rash noted. No erythema. No pallor.  Psychiatric: Affect and judgment normal.   Labs Appointment on 04/29/2018  Component Date Value Ref Range Status  . WBC 04/29/2018 6.8  4.0 - 10.5 K/uL Final  . RBC 04/29/2018 3.46* 4.22 - 5.81 MIL/uL Final  . Hemoglobin 04/29/2018 8.3* 13.0 - 17.0 g/dL Final  . HCT 04/29/2018 27.2* 39.0 - 52.0 %  Final  . MCV 04/29/2018 78.6  78.0 - 100.0 fL Final  . MCH 04/29/2018 24.0* 26.0 - 34.0 pg Final  . MCHC 04/29/2018 30.5  30.0 - 36.0 g/dL Final  . RDW 04/29/2018 17.8* 11.5 - 15.5 % Final  . Platelets 04/29/2018 284  150 - 400 K/uL Final  . Neutrophils Relative % 04/29/2018 47  % Final  . Neutro Abs 04/29/2018 3.2  1.7 - 7.7 K/uL Final  . Lymphocytes Relative 04/29/2018 32  % Final  . Lymphs Abs 04/29/2018 2.1  0.7 - 4.0 K/uL Final  . Monocytes Relative 04/29/2018 14  % Final  . Monocytes Absolute 04/29/2018 0.9  0.1 - 1.0 K/uL Final  . Eosinophils Relative 04/29/2018 7  % Final  . Eosinophils Absolute 04/29/2018 0.5  0.0 - 0.7 K/uL Final  . Basophils Relative 04/29/2018 0  % Final  . Basophils Absolute 04/29/2018 0.0  0.0 - 0.1 K/uL Final   Performed at [facility], [address] [place], [place] [address]  . Sodium 04/29/2018 137  135 - 145 mmol/L Final  . Potassium 04/29/2018 4.7  3.5 - 5.1 mmol/L Final  . Chloride 04/29/2018 100  98 - 111 mmol/L Final  . CO2 04/29/2018 31  22 - 32 mmol/L Final  . Glucose, Bld 04/29/2018 58* 70 - 99 mg/dL Final  . BUN 04/29/2018 34* 8 - 23 mg/dL Final  . Creatinine, Ser 04/29/2018 1.42* 0.61 - 1.24 mg/dL Final  . Calcium 04/29/2018 8.4* 8.9 - 10.3 mg/dL Final  . Total Protein 04/29/2018 6.1* 6.5 - 8.1 g/dL Final  . Albumin 04/29/2018 3.5  3.5 - 5.0 g/dL Final   . AST 04/29/2018 19  15 - 41 U/L Final  . ALT 04/29/2018 19  0 - 44 U/L Final  . Alkaline Phosphatase 04/29/2018 59  38 - 126 U/L Final  . Total Bilirubin 04/29/2018 0.3  0.3 - 1.2 mg/dL Final  . GFR calc non Af Amer 04/29/2018 45* >60 mL/min Final  . GFR calc Af Amer 04/29/2018 52* >60 mL/min Final   Comment: (NOTE) The eGFR has been calculated using the CKD EPI equation. This calculation has not been validated in all clinical situations. eGFR's persistently <60 mL/min signify possible Chronic Kidney Disease.   Georgiann Hahn gap 04/29/2018 6  5 - 15 Final   Performed at [facility], [address], [place], [place] [address]  . Ferritin 04/29/2018 17* 24 - 336 ng/mL Final   Performed at [facility], [place] [address], [place], [place] [address]  . Iron 04/29/2018 63  45 - 182 ug/dL Final  . TIBC 04/29/2018 406  250 - 450 ug/dL Final  . Saturation Ratios 04/29/2018 16* 17.9 - 39.5 % Final  . UIBC 04/29/2018 343  ug/dL Final   Performed at [facility] [address] [place], [place] [address]     Pathology Orders Placed This Encounter  Procedures  . CBC with Differential/Platelet    Standing Status:   Future    Standing Expiration Date:   05/01/2019  . Comprehensive metabolic panel    Standing Status:   Future    Standing Expiration Date:   05/01/2019  . Lactate dehydrogenase    Standing Status:   Future    Standing Expiration Date:   05/01/2019  . Protein electrophoresis, serum    Standing Status:   Future    Standing Expiration Date:   05/01/2019  . Ferritin    Standing Status:   Future  Standing Expiration Date:   05/01/2019  . Vitamin B12    Standing Status:   Future    Standing Expiration Date:   05/01/2019  . Folate    Standing Status:   Future    Standing Expiration Date:   05/01/2019       Zoila Shutter MD

## 2018-05-02 NOTE — Patient Outreach (Signed)
[facility]) Care Management  05/02/2018  [place] DEAGLAN LILE July 17, 1935 [id number]   Follow up call to [facility] patient assistance to check status of patient applications for Basaglar and Humalog. Marcello Moores verified that patient has been approved for both medications as of 07/24 until 10/01/18. He also confirmed per [employer] that both medications had been delivered to providers [name] office on 7/31.  Will follow up with patient in 7-10 business days to confirm medication has been received.  [name] [place], [place] Management [phone]

## 2018-05-03 ENCOUNTER — Other Ambulatory Visit: Payer: Self-pay | Admitting: Physician Assistant

## 2018-05-03 ENCOUNTER — Ambulatory Visit: Payer: Self-pay

## 2018-05-06 ENCOUNTER — Other Ambulatory Visit: Payer: Self-pay

## 2018-05-06 ENCOUNTER — Telehealth: Payer: Self-pay

## 2018-05-06 NOTE — Patient Outreach (Signed)
[facility]) Care Management  05/06/2018  Edward [name] Feb 06, 1935 [id number]    RN CM spoke with Edward [name] following hospital discharge. Member agreed to initial home visit scheduled for 05/03/2018. However, Edward [name] requested that RN CM call him the morning of the visit to confirm his availability. Member requested that RN CM not come to his home prior to speaking with him.  Unable to reach member on 05/03/2018. RN CM continued outreach attempts and tentatively scheduled home visit for 05/06/2018. Outreach attempts unsuccessful.   PLAN Will send unsuccessful outreach letter. Will attempt outreach on 05/09/2018.   [place] [phone]

## 2018-05-06 NOTE — Telephone Encounter (Signed)
Patient wife called one of the other clinical staff voicemail in the office and they forwarded the message to me. The patient (wife)  left a message stating patient needed his insulin. When I went to return patient call line still busy.

## 2018-05-06 NOTE — Telephone Encounter (Signed)
Call placed to patient to inform him that his  Insulin has been delivered to out our office. I am not able to reach patient as his line is busy and has been busy since 05/03/2018. I will call Etter Sjogren  with [facility] and make her aware.  I spoke with laura and explained to her we had patient humalog and Basaglar and that I was not able to get in touch with patient. She stated she would forward the information to [facility] which is the patient nurse case manager with [facility]  will follow up with patient today she has also been informed that we have the patient insulin and that I am unable to get in contact with the patient.

## 2018-05-07 ENCOUNTER — Inpatient Hospital Stay (HOSPITAL_COMMUNITY): Payer: Medicare Other | Attending: Internal Medicine

## 2018-05-07 VITALS — BP 121/53 | HR 88 | Temp 98.6°F | Resp 16

## 2018-05-07 DIAGNOSIS — D500 Iron deficiency anemia secondary to blood loss (chronic): Secondary | ICD-10-CM | POA: Insufficient documentation

## 2018-05-07 DIAGNOSIS — Z79899 Other long term (current) drug therapy: Secondary | ICD-10-CM | POA: Diagnosis not present

## 2018-05-07 MED ORDER — SODIUM CHLORIDE 0.9 % IV SOLN
INTRAVENOUS | Status: DC
  Administered 2018-05-07: 14:00:00 via INTRAVENOUS

## 2018-05-07 MED ORDER — SODIUM CHLORIDE 0.9 % IV SOLN
750.0000 mg | Freq: Once | INTRAVENOUS | Status: AC
  Administered 2018-05-07: 750 mg via INTRAVENOUS
  Filled 2018-05-07: qty 15

## 2018-05-07 NOTE — Patient Instructions (Signed)
[facility] at [facility]  Discharge Instructions:  You received an iron infusion today.  _______________________________________________________________  Thank you for choosing [facility] at [facility] to provide your oncology and hematology care.  To afford each patient quality time with our providers, please arrive at least 15 minutes before your scheduled appointment.  You need to re-schedule your appointment if you arrive 10 or more minutes late.  We strive to give you quality time with our providers, and arriving late affects you and other patients whose appointments are after yours.  Also, if you no show three or more times for appointments you may be dismissed from the clinic.  Again, thank you for choosing [facility] at [facility]. Our hope is that these requests will allow you access to exceptional care and in a timely manner. _______________________________________________________________  If you have questions after your visit, please contact our office at [phone] between the hours of 8:30 a.m. and 5:00 p.m. Voicemails left after 4:30 p.m. will not be returned until the following business day. _______________________________________________________________  For prescription refill requests, have your pharmacy contact our office. _______________________________________________________________  Recommendations made by the consultant and any test results will be sent to your referring physician. _______________________________________________________________ 

## 2018-05-07 NOTE — Progress Notes (Signed)
Patient tolerated iron infusion with no complaints voiced. Peripheral IV site clean and dry with no bruising or swelling noted.  Good blood return noted before and after administration of iron.  Band aid applied. Patient left ambulatory with family with no s/s of distress noted.

## 2018-05-07 NOTE — Telephone Encounter (Signed)
Received fax from [employer]  That patient application for assistance has been approved to receive Eliquis free of charge from 04/24/2018-10/01/2018

## 2018-05-09 ENCOUNTER — Other Ambulatory Visit: Payer: Self-pay

## 2018-05-09 NOTE — Patient Outreach (Signed)
[facility]) Care Management  05/09/2018  [name] Apr 29, 1935 [id number]    RN CM attempted to reach patient. Outreach Attempt unsuccessful. Unable to leave voice message.   PLAN RN CM will attempt outreach in 3-4 business days.   [place] [phone]

## 2018-05-10 NOTE — Telephone Encounter (Signed)
Patient son came by office and pick up insulin for patient and stated there number had been changed, Patient son provided the new number to the front desk staff

## 2018-05-14 ENCOUNTER — Inpatient Hospital Stay (HOSPITAL_COMMUNITY): Payer: Medicare Other

## 2018-05-14 ENCOUNTER — Other Ambulatory Visit: Payer: Self-pay

## 2018-05-14 ENCOUNTER — Other Ambulatory Visit: Payer: Self-pay | Admitting: Pharmacy Technician

## 2018-05-14 VITALS — BP 139/50 | HR 68 | Temp 97.6°F | Resp 20

## 2018-05-14 DIAGNOSIS — D500 Iron deficiency anemia secondary to blood loss (chronic): Secondary | ICD-10-CM

## 2018-05-14 DIAGNOSIS — Z79899 Other long term (current) drug therapy: Secondary | ICD-10-CM | POA: Diagnosis not present

## 2018-05-14 MED ORDER — SODIUM CHLORIDE 0.9 % IV SOLN
INTRAVENOUS | Status: DC
  Administered 2018-05-14: 14:00:00 via INTRAVENOUS

## 2018-05-14 MED ORDER — SODIUM CHLORIDE 0.9 % IV SOLN
750.0000 mg | Freq: Once | INTRAVENOUS | Status: AC
  Administered 2018-05-14: 750 mg via INTRAVENOUS
  Filled 2018-05-14: qty 15

## 2018-05-14 NOTE — Patient Outreach (Addendum)
[place] [facility]) Care Management  05/14/2018  Edward [name] 09/19/1935 [id number]  Medication Assistance: Call placed to [name] [phone]) to see if I could have Edward [name] Eliquis shipment expedited.  Representative [name] states that his referral was closed because they were unable to reach him to schedule delivery.  She states they close case after three outreach attempts.  Requested that they reopen referral.  She states it will take ~ three hours to get his case reopened and that they can only ship if orders are in by 4 pm central time.    I made unsuccessful outreach attempt to Edward [name] to arrange delivery.  Voice mail is not set up to receive messages.    Plan: Continue to outreach to Edward [name] until delivery scheduled.    [name], PharmD Clinical Pharmacist [place] [phone]  Addendum: Incoming call received from Edward [name] son.  HIPAA identifiers verified.  Call placed to [facility] to schedule delivery of Eliquis.  They do not have the case open yet, therefore can't schedule.  Informed son, that he will have to call first thing in the morning to schedule shipment as it must be a family member to do so.  He verbalized understanding and has the phone number.  Informed family to please read all the information that comes with their patient assistance medications,as it will tell how to order refills.  Again, he verbalized understanding.   Edward [name] reports that he has picked up his Basaglar and Humalog that was sent to his PCP and he has received Ventolin from [place].    Plan: Outreach attempt Friday to ensure that patient received his Eliquis.  [name], PharmD Clinical Pharmacist [place] [phone]

## 2018-05-14 NOTE — Patient Outreach (Signed)
[facility]) Care Management  05/14/2018  Edward [name] December 20, 1934 [id number]   Follow up call to check status of patients application for Eliquis. Per [place], patient was approved as of 04/24/18 until 10/01/18.  Will follow up with patient in 7-10 business days to confirm medication has been received.  [name] [place], [place] Management [phone]

## 2018-05-14 NOTE — Patient Outreach (Signed)
[place] [facility] Care Management  05/14/2018  Bruce TEDD COTTRILL [birth year] [id number]   RN CM placed call for 3rd outreach attempt. Outreach attempt successful. Mr. Tubbs confirmed [phone] as the best contact number. Member agreed to assessment and home visit with RN CM on next week.   PLAN RN CM will follow up on next week for home visit.   [place] [phone]

## 2018-05-14 NOTE — Progress Notes (Signed)
Edward [name] tolerated Injectafer infusion without incident or complaint. VSS upon completion of treatment. Patient to follow up in 8 weeks with Dr. Walden Field as already scheduled. Discharged self ambulatory in satisfactory condition in company of family members.

## 2018-05-14 NOTE — Patient Instructions (Signed)
[facility] at [facility]  Discharge Instructions:  Today you received Injectafer infusion. Follow up as scheduled. Call clinic for any questions or concerns. _______________________________________________________________  Thank you for choosing [facility] at [facility] to provide your oncology and hematology care.  To afford each patient quality time with our providers, please arrive at least 15 minutes before your scheduled appointment.  You need to re-schedule your appointment if you arrive 10 or more minutes late.  We strive to give you quality time with our providers, and arriving late affects you and other patients whose appointments are after yours.  Also, if you no show three or more times for appointments you may be dismissed from the clinic.  Again, thank you for choosing [facility] at [facility]. Our hope is that these requests will allow you access to exceptional care and in a timely manner. _______________________________________________________________  If you have questions after your visit, please contact our office at [phone] between the hours of 8:30 a.m. and 5:00 p.m. Voicemails left after 4:30 p.m. will not be returned until the following business day. _______________________________________________________________  For prescription refill requests, have your pharmacy contact our office. _______________________________________________________________  Recommendations made by the consultant and any test results will be sent to your referring physician. _______________________________________________________________ 

## 2018-05-17 ENCOUNTER — Other Ambulatory Visit: Payer: Self-pay

## 2018-05-17 ENCOUNTER — Ambulatory Visit: Payer: Self-pay

## 2018-05-17 NOTE — Patient Outreach (Addendum)
[facility]) Care Management  05/17/2018  [place] Edward [name] September 10, 1935 [id number]  Successful outreach attempt to Edward [name].  HIPAA identifiers verified.  Medication Assistance: Edward [name] reports he received his Eliquis from [employer] patient assistance yesterday.  He states that he has the paperwork that tells him how to order refills.  He has also picked up his Basaglar and Humalog patient assistance medications that were delivered to PCP office.    Patient states that he has no medication issues at this time and is doing "ok."  Informed him that I will close his [place] case.  He is aware that he can call me in the future should medication issues arise.  Plan: Route discipline closure letter to PCP, Dr. Doren Custard.  Inform [facility] RN, Neldon Labella, of pharmacy case closure.  [name], PharmD [place] [phone]

## 2018-05-21 DIAGNOSIS — J449 Chronic obstructive pulmonary disease, unspecified: Secondary | ICD-10-CM | POA: Insufficient documentation

## 2018-05-21 DIAGNOSIS — E119 Type 2 diabetes mellitus without complications: Secondary | ICD-10-CM | POA: Insufficient documentation

## 2018-05-21 DIAGNOSIS — I10 Essential (primary) hypertension: Secondary | ICD-10-CM | POA: Insufficient documentation

## 2018-05-21 DIAGNOSIS — I5030 Unspecified diastolic (congestive) heart failure: Secondary | ICD-10-CM | POA: Insufficient documentation

## 2018-05-21 DIAGNOSIS — N183 Chronic kidney disease, stage 3 unspecified: Secondary | ICD-10-CM | POA: Insufficient documentation

## 2018-05-22 ENCOUNTER — Ambulatory Visit: Payer: Self-pay

## 2018-05-23 ENCOUNTER — Other Ambulatory Visit: Payer: Self-pay

## 2018-05-23 NOTE — Telephone Encounter (Signed)
REVIEWED. WILL MAKE DR. BRANCH AWARE.

## 2018-05-23 NOTE — Telephone Encounter (Signed)
Received call from [facility]. They had to try several times to schedule TCS. Clinic coordinator had to then call pt and explain why Dr. Oneida Alar wanted him to have TCS. He agreed to schedule TCS. He called them today and cancelled procedure. They asked why he wanted to cancel. He told them he don't want to do and will not do it. Receptionist had also talked to several family members and explained reason for TCS.

## 2018-06-02 DIAGNOSIS — J449 Chronic obstructive pulmonary disease, unspecified: Secondary | ICD-10-CM | POA: Diagnosis not present

## 2018-06-05 ENCOUNTER — Other Ambulatory Visit: Payer: Self-pay | Admitting: Physician Assistant

## 2018-06-17 ENCOUNTER — Ambulatory Visit (INDEPENDENT_AMBULATORY_CARE_PROVIDER_SITE_OTHER): Payer: Medicare Other | Admitting: Physician Assistant

## 2018-06-17 ENCOUNTER — Encounter: Payer: Self-pay | Admitting: Physician Assistant

## 2018-06-17 VITALS — BP 126/60 | HR 73 | Temp 97.6°F | Resp 20 | Ht 65.0 in | Wt 187.8 lb

## 2018-06-17 DIAGNOSIS — I480 Paroxysmal atrial fibrillation: Secondary | ICD-10-CM | POA: Diagnosis not present

## 2018-06-17 DIAGNOSIS — D500 Iron deficiency anemia secondary to blood loss (chronic): Secondary | ICD-10-CM

## 2018-06-17 DIAGNOSIS — N183 Chronic kidney disease, stage 3 unspecified: Secondary | ICD-10-CM

## 2018-06-17 DIAGNOSIS — Z9114 Patient's other noncompliance with medication regimen: Secondary | ICD-10-CM

## 2018-06-17 DIAGNOSIS — J8410 Pulmonary fibrosis, unspecified: Secondary | ICD-10-CM | POA: Diagnosis not present

## 2018-06-17 DIAGNOSIS — I5032 Chronic diastolic (congestive) heart failure: Secondary | ICD-10-CM

## 2018-06-17 DIAGNOSIS — I10 Essential (primary) hypertension: Secondary | ICD-10-CM

## 2018-06-17 DIAGNOSIS — E1165 Type 2 diabetes mellitus with hyperglycemia: Secondary | ICD-10-CM | POA: Diagnosis not present

## 2018-06-17 DIAGNOSIS — J9611 Chronic respiratory failure with hypoxia: Secondary | ICD-10-CM

## 2018-06-17 DIAGNOSIS — E785 Hyperlipidemia, unspecified: Secondary | ICD-10-CM

## 2018-06-17 DIAGNOSIS — N170 Acute kidney failure with tubular necrosis: Secondary | ICD-10-CM

## 2018-06-17 DIAGNOSIS — R600 Localized edema: Secondary | ICD-10-CM

## 2018-06-17 DIAGNOSIS — Z91148 Patient's other noncompliance with medication regimen for other reason: Secondary | ICD-10-CM

## 2018-06-17 DIAGNOSIS — J439 Emphysema, unspecified: Secondary | ICD-10-CM

## 2018-06-17 DIAGNOSIS — Z09 Encounter for follow-up examination after completed treatment for conditions other than malignant neoplasm: Secondary | ICD-10-CM

## 2018-06-17 DIAGNOSIS — K921 Melena: Secondary | ICD-10-CM

## 2018-06-17 MED ORDER — PREDNISONE 20 MG PO TABS: ORAL_TABLET | ORAL | 0 refills | Status: DC

## 2018-06-17 NOTE — Progress Notes (Signed)
Patient ID: Edward [name] MRN: [id number], DOB: Jan 26, 1935, 83 y.o. Date of Encounter: @DATE @  Chief Complaint:  Chief Complaint  Patient presents with  . Diabetes  . Hyperlipidemia  . Fatigue    HPI: 83 y.o. year old male  presents with above.   Reviewed recent hospital discharge summary.  Hospitalized 04/17/2018 through 04/23/2018.  That point he presented to ED with shortness of breath, wheezing, leg swelling.  Was found to have COPD exacerbation as well as acute on chronic diastolic CHF.  Complicated by development of worsening anemia requiring transfusions of PRBCs.  Found to have heme positive stools.  Was seen by GI and underwent EGD/colonoscopy.  Was felt the bleeding may be related to internal hemorrhoids.  Reviewed that since hospital discharge he has had iron infusions and follow-up with heme-onc.  Today he reports that he has been having some increased cough and wheezing especially at night recently.  Asked if he was being compliant with his pulmonary medications.  He replies that he is using his nebulizers.  Noted that he is not wearing his nasal cannula oxygen during visit.  He reports that his oxygen tank is in his truck.  Also reports that he is having some increased fatigue/weakness.  He has no other specific concerns.   Past Medical History:  Diagnosis Date  . Allergy    Rhinitis  . Bronchitis   . Chronic respiratory failure ([place])   . Colon polyps   . COPD (chronic obstructive pulmonary disease) ([place])   . Diabetes mellitus   . Elevated lipids   . Hypercholesterolemia   . Hypertension   . Iron deficiency anemia due to chronic blood loss 04/30/2018  . Noncompliance   . On home O2    2L N/C   . PSA elevation   . Pulmonary fibrosis ([place])   . Vitamin D deficiency      Home Meds: Outpatient Medications Prior to Visit  Medication Sig Dispense Refill  . albuterol (PROVENTIL) (2.5 MG/3ML) 0.083% nebulizer solution     . apixaban (ELIQUIS) 2.5 MG TABS  tablet Take 1 tablet (2.5 mg total) by mouth 2 (two) times daily. Restart on 7/24 120 tablet 0  . aspirin EC 81 MG tablet Take 1 tablet (81 mg total) by mouth daily. Resume on 7/24    . budesonide (PULMICORT) 0.5 MG/2ML nebulizer solution Take 2 mLs (0.5 mg total) by nebulization 2 (two) times daily. 120 mL 5  . cloNIDine (CATAPRES) 0.1 MG tablet TAKE 1 TABLET BY MOUTH 2 TIMES A DAY 60 tablet 2  . diltiazem (CARDIZEM CD) 240 MG 24 hr capsule TAKE 1 CAPSULE BY MOUTH DAILY 30 capsule 2  . furosemide (LASIX) 40 MG tablet Take 1 tablet (40 mg total) by mouth daily. 90 tablet 0  . hydrocortisone (ANUSOL-HC) 25 MG suppository Place 1 suppository (25 mg total) rectally every 12 (twelve) hours. 12 suppository 1  . Insulin Glargine (BASAGLAR KWIKPEN) 100 UNIT/ML SOPN Inject 20 Units into the skin 2 (two) times daily.    . insulin lispro (HUMALOG) 100 UNIT/ML KwikPen Junior Inject 0.08 mLs (8 Units total) into the skin 3 (three) times daily. 3 mL 3  . ipratropium (ATROVENT) 0.02 % nebulizer solution Take 2.5 mLs (0.5 mg total) by nebulization 4 (four) times daily. 25 mL 12  . ipratropium (ATROVENT) 0.02 % nebulizer solution INHALE THE CONTENTS OF 1 VIAL VIA NEBULIZATION 4 TIMES DAILY 75 mL 11  . losartan (COZAAR) 100 MG tablet TAKE 1 TABLET  BY MOUTH DAILY 30 tablet 0  . metoprolol tartrate (LOPRESSOR) 25 MG tablet TAKE 1 TABLET BY MOUTH TWICE DAILY 60 tablet 0  . ONETOUCH DELICA LANCETS 03E MISC USE TO CHECK BLOOD SUGAR TWICE DAILY AS DIRECTED 100 each 2  . OXYGEN Inhale 3 L into the lungs daily.     . pantoprazole (PROTONIX) 40 MG tablet Take 1 tablet (40 mg total) by mouth 2 (two) times daily before a meal. 60 tablet 1  . polyethylene glycol (MIRALAX / GLYCOLAX) packet Take 17 g by mouth daily. 14 each 0  . potassium chloride SA (K-DUR,KLOR-CON) 20 MEQ tablet Take 1 tablet (20 mEq total) by mouth every other day. When he takes lasix 90 tablet 0  . pravastatin (PRAVACHOL) 80 MG tablet Take 1 tablet (80 mg  total) by mouth at bedtime. 30 tablet 3  . pravastatin (PRAVACHOL) 80 MG tablet TAKE 1 TABLET BY MOUTH AT BEDTIME 30 tablet 0   No facility-administered medications prior to visit.     Allergies:  Allergies  Allergen Reactions  . Ace Inhibitors Other (See Comments)    Hyperkalemia--07/23/2013:patient states not familiar with the following allergy    Social History   Socioeconomic History  . Marital status: Married    Spouse name: Not on file  . Number of children: Not on file  . Years of education: Not on file  . Highest education level: Not on file  Occupational History  . 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 Financial resource strain: Not on file  . Food insecurity:    Worry: Not on file    Inability: Not on file  . Transportation needs:    Medical: Not on file    Non-medical: Not on file  Tobacco Use  . Smoking status: Former Smoker    Packs/day: 1.50    Years: 60.00    Pack years: 90.00    Types: Cigarettes    Last attempt to quit: 12/31/2012    Years since quitting: 5.4  . Smokeless tobacco: Never Used  Substance and Sexual Activity  . Alcohol use: No  . Drug use: No  . Sexual activity: Yes    Birth control/protection: None  Lifestyle  . Physical activity:    Days per week: Not on file    Minutes per session: Not on file  . Stress: Not on file  Relationships  . Social connections:    Talks on phone: Not on file    Gets together: Not on file    Attends religious service: Not on file    Active member of club or organization: Not on file    Attends meetings of clubs or organizations: Not on file    Relationship status: Not on file  . Intimate partner violence:    Fear of current or ex partner: Not on file    Emotionally abused: Not on file    Physically abused: Not on file    Forced sexual activity: Not on file  Other Topics Concern  . Not on file  Social History Narrative  . Not on file    Family History  Problem  Relation Age of Onset  . Heart disease Mother   . CAD Other   . Diabetes Other      Review of Systems:  See HPI for pertinent ROS. All other ROS negative.    Physical Exam: Blood pressure 126/60, pulse 73, temperature 97.6 F (36.4 C), temperature source Oral, resp.  rate 20, height 5\' 5"  (1.651 m), weight 85.2 kg, SpO2 (!) 80 %., Body mass index is 31.25 kg/m. General: WM.  Appears in no acute distress. Neck: Supple. No thyromegaly. No lymphadenopathy. Lungs: Mild wheezes throughout bilaterally. Heart: RRR with S1 S2. No murmurs, rubs, or gallops. Abdomen: Soft, non-tender, non-distended with normoactive bowel sounds. No hepatomegaly. No rebound/guarding. No obvious abdominal masses. Musculoskeletal:  Strength and tone normal for age. Extremities/Skin: Warm and dry. Trace/ No LE edema.  Neuro: Alert and oriented X 3. Moves all extremities spontaneously. Gait is normal. CNII-XII grossly in tact. Psych:  Responds to questions appropriately with a normal affect.     ASSESSMENT AND PLAN:  83 y.o. year old male with   1. Essential hypertension Blood Pressure is at goal/controlled.  Continue current medications.  Check lab to monitor. - COMPLETE METABOLIC PANEL WITH GFR  2. Chronic diastolic CHF (congestive heart failure) ([name]) Has trace to no lower extremity edema on exam.  Continue current treatment.  Check lab to monitor. - COMPLETE METABOLIC PANEL WITH GFR  3. AF (paroxysmal atrial fibrillation) ([name]) Stable, controlled.  Continue current treatment.  4. Pulmonary emphysema, unspecified emphysema type ([place]) Reports increased wheezing and cough symptoms.  He does have some wheezing on exam.  States that his oxygen is in his truck.  He is to be compliant with keeping on his oxygen and compliant with meds.  Will add prednisone. - predniSONE (DELTASONE) 20 MG tablet; Take 2 daily for 4 days then take 1 for 2 days.  Dispense: 10 tablet; Refill: 0  5. Pulmonary fibrosis ([place])  6.  Chronic respiratory failure with hypoxia ([name]) See #4 - predniSONE (DELTASONE) 20 MG tablet; Take 2 daily for 4 days then take 1 for 2 days.  Dispense: 10 tablet; Refill: 0  7. Uncontrolled type 2 diabetes mellitus with hyperglycemia ([name]) We will check A1c to monitor.  He brngs and blood sugar readings to me but it the correlating dates are from November 2018 - COMPLETE METABOLIC PANEL WITH GFR - Hemoglobin A1c  8. CKD (chronic kidney disease), stage III ([place]) Check lab to monitor. - COMPLETE METABOLIC PANEL WITH GFR  9. Acute renal failure with acute tubular necrosis superimposed on stage 3 chronic kidney disease ([place]) Check lab to monitor.  - COMPLETE METABOLIC PANEL WITH GFR  10. Elevated lipids On pravastatin.  Check LFTs to monitor. - COMPLETE METABOLIC PANEL WITH GFR  11. Non compliance w medication regimen  12. Hematochezia  13. Iron deficiency anemia due to chronic blood loss - CBC with Differential/Platelet  14. Hospital discharge follow-up  15. Chronic diastolic heart failure ([place])  16. Bilateral lower extremity edema He has trace/no lower extremity edema on exam today.  Continue current treatment.   Signed, [address] [place], [place], [facility] 06/17/2018 8:15 AM

## 2018-06-18 LAB — COMPLETE METABOLIC PANEL WITH GFR
AG Ratio: 2.3 (calc) (ref 1.0–2.5)
ALBUMIN MSPROF: 4.4 g/dL (ref 3.6–5.1)
ALT: 12 U/L (ref 9–46)
AST: 14 U/L (ref 10–35)
Alkaline phosphatase (APISO): 68 U/L (ref 40–115)
BILIRUBIN TOTAL: 0.2 mg/dL (ref 0.2–1.2)
BUN / CREAT RATIO: 27 (calc) — AB (ref 6–22)
BUN: 28 mg/dL — AB (ref 7–25)
CO2: 33 mmol/L — AB (ref 20–32)
CREATININE: 1.03 mg/dL (ref 0.70–1.11)
Calcium: 9.6 mg/dL (ref 8.6–10.3)
Chloride: 95 mmol/L — ABNORMAL LOW (ref 98–110)
GFR, EST NON AFRICAN AMERICAN: 68 mL/min/{1.73_m2} (ref >=60)
GFR, Est African American: 79 mL/min/{1.73_m2} (ref >=60)
GLUCOSE: 94 mg/dL (ref 65–99)
Globulin: 1.9 g/dL (calc) (ref 1.9–3.7)
Potassium: 4.8 mmol/L (ref 3.5–5.3)
Sodium: 135 mmol/L (ref 135–146)
Total Protein: 6.3 g/dL (ref 6.1–8.1)

## 2018-06-18 LAB — CBC WITH DIFFERENTIAL/PLATELET
BASOS PCT: 1.1 %
Basophils Absolute: 61 cells/uL (ref 0–200)
EOS ABS: 842 {cells}/uL — AB (ref 15–500)
Eosinophils Relative: 15.3 %
HCT: 34.5 % — ABNORMAL LOW (ref 38.5–50.0)
HEMOGLOBIN: 11.1 g/dL — AB (ref 13.2–17.1)
Lymphs Abs: 1463 cells/uL (ref 850–3900)
MCH: 27.2 pg (ref 27.0–33.0)
MCHC: 32.2 g/dL (ref 32.0–36.0)
MCV: 84.6 fL (ref 80.0–100.0)
MPV: 9.7 fL (ref 7.5–12.5)
Monocytes Relative: 11.7 %
NEUTROS ABS: 2492 {cells}/uL (ref 1500–7800)
Neutrophils Relative %: 45.3 %
Platelets: 259 10*3/uL (ref 140–400)
RBC: 4.08 10*6/uL — ABNORMAL LOW (ref 4.20–5.80)
RDW: 19.7 % — ABNORMAL HIGH (ref 11.0–15.0)
Total Lymphocyte: 26.6 %
WBC mixed population: 644 cells/uL (ref 200–950)
WBC: 5.5 10*3/uL (ref 3.8–10.8)

## 2018-06-18 LAB — HEMOGLOBIN A1C
EAG (MMOL/L): 8.4 (calc)
Hgb A1c MFr Bld: 6.9 % of total Hgb — ABNORMAL HIGH (ref <5.7)
Mean Plasma Glucose: 151 (calc)

## 2018-06-26 ENCOUNTER — Other Ambulatory Visit: Payer: Self-pay

## 2018-06-26 ENCOUNTER — Ambulatory Visit: Payer: Self-pay

## 2018-06-26 NOTE — Patient Outreach (Signed)
[facility] [facility]) Care Management  06/26/2018  [place] ROCKET GUNDERSON Nov 20, 1934 [id number]    RNCM contacted Mr. Mirarchi for routine outreach. No answer at [phone]. Unable to leave voice message due to voice mailbox not being set up.    PLAN Will attempt to reach member in 3-4 business days.   [place] [phone]

## 2018-07-01 ENCOUNTER — Other Ambulatory Visit: Payer: Self-pay

## 2018-07-01 NOTE — Patient Outreach (Signed)
[place] [facility]) Care Management  07/01/2018  NESTER BACHUS 06-08-1935 [id number]   Outreach call placed. No answer at [phone].  Unable to leave a voice message due to voice mailbox not being set up.  PLAN Will send unsuccessful outreach letter. Will follow up in 3-4 business days.   [place] [phone]

## 2018-07-02 ENCOUNTER — Other Ambulatory Visit: Payer: Self-pay | Admitting: Physician Assistant

## 2018-07-02 ENCOUNTER — Inpatient Hospital Stay (HOSPITAL_COMMUNITY): Payer: Medicare Other | Attending: Hematology

## 2018-07-02 DIAGNOSIS — N183 Chronic kidney disease, stage 3 (moderate): Secondary | ICD-10-CM | POA: Insufficient documentation

## 2018-07-02 DIAGNOSIS — Z794 Long term (current) use of insulin: Secondary | ICD-10-CM | POA: Diagnosis not present

## 2018-07-02 DIAGNOSIS — E7800 Pure hypercholesterolemia, unspecified: Secondary | ICD-10-CM | POA: Insufficient documentation

## 2018-07-02 DIAGNOSIS — Z87891 Personal history of nicotine dependence: Secondary | ICD-10-CM | POA: Diagnosis not present

## 2018-07-02 DIAGNOSIS — Z9981 Dependence on supplemental oxygen: Secondary | ICD-10-CM | POA: Insufficient documentation

## 2018-07-02 DIAGNOSIS — I480 Paroxysmal atrial fibrillation: Secondary | ICD-10-CM | POA: Diagnosis not present

## 2018-07-02 DIAGNOSIS — D500 Iron deficiency anemia secondary to blood loss (chronic): Secondary | ICD-10-CM | POA: Insufficient documentation

## 2018-07-02 DIAGNOSIS — G4700 Insomnia, unspecified: Secondary | ICD-10-CM | POA: Insufficient documentation

## 2018-07-02 DIAGNOSIS — Z7901 Long term (current) use of anticoagulants: Secondary | ICD-10-CM | POA: Diagnosis not present

## 2018-07-02 DIAGNOSIS — Z79899 Other long term (current) drug therapy: Secondary | ICD-10-CM | POA: Insufficient documentation

## 2018-07-02 DIAGNOSIS — K2980 Duodenitis without bleeding: Secondary | ICD-10-CM | POA: Diagnosis not present

## 2018-07-02 DIAGNOSIS — I130 Hypertensive heart and chronic kidney disease with heart failure and stage 1 through stage 4 chronic kidney disease, or unspecified chronic kidney disease: Secondary | ICD-10-CM | POA: Diagnosis not present

## 2018-07-02 DIAGNOSIS — E559 Vitamin D deficiency, unspecified: Secondary | ICD-10-CM | POA: Insufficient documentation

## 2018-07-02 DIAGNOSIS — E1122 Type 2 diabetes mellitus with diabetic chronic kidney disease: Secondary | ICD-10-CM | POA: Diagnosis not present

## 2018-07-02 DIAGNOSIS — Z7982 Long term (current) use of aspirin: Secondary | ICD-10-CM | POA: Diagnosis not present

## 2018-07-02 DIAGNOSIS — J441 Chronic obstructive pulmonary disease with (acute) exacerbation: Secondary | ICD-10-CM | POA: Insufficient documentation

## 2018-07-02 DIAGNOSIS — K5900 Constipation, unspecified: Secondary | ICD-10-CM | POA: Insufficient documentation

## 2018-07-02 DIAGNOSIS — J449 Chronic obstructive pulmonary disease, unspecified: Secondary | ICD-10-CM | POA: Diagnosis not present

## 2018-07-02 DIAGNOSIS — K219 Gastro-esophageal reflux disease without esophagitis: Secondary | ICD-10-CM | POA: Insufficient documentation

## 2018-07-02 LAB — CBC WITH DIFFERENTIAL/PLATELET
Basophils Absolute: 0 10*3/uL (ref 0.0–0.1)
Basophils Relative: 0 %
Eosinophils Absolute: 0.5 10*3/uL (ref 0.0–0.7)
Eosinophils Relative: 9 %
HCT: 34.8 % — ABNORMAL LOW (ref 39.0–52.0)
Hemoglobin: 11 g/dL — ABNORMAL LOW (ref 13.0–17.0)
LYMPHS PCT: 26 %
Lymphs Abs: 1.3 10*3/uL (ref 0.7–4.0)
MCH: 28.1 pg (ref 26.0–34.0)
MCHC: 31.6 g/dL (ref 30.0–36.0)
MCV: 88.8 fL (ref 78.0–100.0)
MONO ABS: 0.6 10*3/uL (ref 0.1–1.0)
MONOS PCT: 11 %
NEUTROS ABS: 2.6 10*3/uL (ref 1.7–7.7)
Neutrophils Relative %: 54 %
Platelets: 214 10*3/uL (ref 150–400)
RBC: 3.92 MIL/uL — AB (ref 4.22–5.81)
RDW: 19 % — AB (ref 11.5–15.5)
WBC: 4.9 10*3/uL (ref 4.0–10.5)

## 2018-07-02 LAB — COMPREHENSIVE METABOLIC PANEL
ALT: 23 U/L (ref 0–44)
ANION GAP: 8 (ref 5–15)
AST: 20 U/L (ref 15–41)
Albumin: 3.7 g/dL (ref 3.5–5.0)
Alkaline Phosphatase: 69 U/L (ref 38–126)
BUN: 25 mg/dL — ABNORMAL HIGH (ref 8–23)
CHLORIDE: 94 mmol/L — AB (ref 98–111)
CO2: 32 mmol/L (ref 22–32)
CREATININE: 1.13 mg/dL (ref 0.61–1.24)
Calcium: 8.8 mg/dL — ABNORMAL LOW (ref 8.9–10.3)
GFR calc Af Amer: >60 mL/min (ref >60)
GFR, EST NON AFRICAN AMERICAN: 59 mL/min — AB (ref >60)
Glucose, Bld: 202 mg/dL — ABNORMAL HIGH (ref 70–99)
POTASSIUM: 5.2 mmol/L — AB (ref 3.5–5.1)
SODIUM: 134 mmol/L — AB (ref 135–145)
Total Bilirubin: 0.3 mg/dL (ref 0.3–1.2)
Total Protein: 6.6 g/dL (ref 6.5–8.1)

## 2018-07-02 LAB — FERRITIN: Ferritin: 161 ng/mL (ref 24–336)

## 2018-07-02 LAB — FOLATE: Folate: 9.7 ng/mL (ref >5.9)

## 2018-07-02 LAB — VITAMIN B12: VITAMIN B 12: 259 pg/mL (ref 180–914)

## 2018-07-02 LAB — LACTATE DEHYDROGENASE: LDH: 183 U/L (ref 98–192)

## 2018-07-03 ENCOUNTER — Encounter: Payer: Self-pay | Admitting: Physician Assistant

## 2018-07-03 ENCOUNTER — Ambulatory Visit (INDEPENDENT_AMBULATORY_CARE_PROVIDER_SITE_OTHER): Payer: Medicare Other | Admitting: Physician Assistant

## 2018-07-03 VITALS — BP 146/64 | HR 79 | Temp 98.5°F | Resp 20 | Ht 65.0 in | Wt 190.2 lb

## 2018-07-03 DIAGNOSIS — N183 Chronic kidney disease, stage 3 unspecified: Secondary | ICD-10-CM

## 2018-07-03 DIAGNOSIS — J441 Chronic obstructive pulmonary disease with (acute) exacerbation: Secondary | ICD-10-CM

## 2018-07-03 DIAGNOSIS — J9621 Acute and chronic respiratory failure with hypoxia: Secondary | ICD-10-CM | POA: Diagnosis not present

## 2018-07-03 DIAGNOSIS — Z9981 Dependence on supplemental oxygen: Secondary | ICD-10-CM

## 2018-07-03 DIAGNOSIS — I5023 Acute on chronic systolic (congestive) heart failure: Secondary | ICD-10-CM | POA: Diagnosis not present

## 2018-07-03 DIAGNOSIS — Z9114 Patient's other noncompliance with medication regimen: Secondary | ICD-10-CM | POA: Diagnosis not present

## 2018-07-03 LAB — PROTEIN ELECTROPHORESIS, SERUM
A/G Ratio: 1.5 (ref 0.7–1.7)
Albumin ELP: 3.5 g/dL (ref 2.9–4.4)
Alpha-1-Globulin: 0.2 g/dL (ref 0.0–0.4)
Alpha-2-Globulin: 0.8 g/dL (ref 0.4–1.0)
Beta Globulin: 0.9 g/dL (ref 0.7–1.3)
Gamma Globulin: 0.6 g/dL (ref 0.4–1.8)
Globulin, Total: 2.4 g/dL (ref 2.2–3.9)
TOTAL PROTEIN ELP: 5.9 g/dL — AB (ref 6.0–8.5)

## 2018-07-03 MED ORDER — PREDNISONE 20 MG PO TABS: 20.0000 mg | ORAL_TABLET | Freq: Every day | ORAL | 0 refills | Status: DC

## 2018-07-03 MED ORDER — AZITHROMYCIN 250 MG PO TABS: ORAL_TABLET | ORAL | 0 refills | Status: DC

## 2018-07-03 NOTE — Progress Notes (Signed)
Patient ID: Edward [name] MRN: [id number], DOB: 1934/11/06, 83 y.o. Date of Encounter: 07/03/2018, 2:29 PM    Chief Complaint:  Chief Complaint  Patient presents with  . Wheezing  . Cough     HPI: 83 y.o. year old male presents with above.   He reprts that he "has been having wheezing, cough, and getting choked up"----"especially at night---gets worse at night" He brought in oxygen tank with him and wearing his nasal cannula oxygen but his oxygen tank became empty during his visit. I asked about his use of nebulizers and inhalers recently. He says that he has been using a nebulizer 3 times a day.  I asked if he is you putting different medicines and at a different times with the same medicine.  He replies "one medicine ". He says that he has no inhalers currently.  Says that he "don't have none". Also asked about his fluid pill Lasix/furosemide.  He cannot tell me for certain whether he has been taking this routinely.     Home Meds:   Outpatient Medications Prior to Visit  Medication Sig Dispense Refill  . albuterol (PROVENTIL) (2.5 MG/3ML) 0.083% nebulizer solution     . albuterol (PROVENTIL) (2.5 MG/3ML) 0.083% nebulizer solution INHALE 1 VIAL VIA NEBULIZER EVERY 6 HOURS AS NEEDED FOR WHEEZING OR SHORTNESS OF BREATH 360 mL 4  . apixaban (ELIQUIS) 2.5 MG TABS tablet Take 1 tablet (2.5 mg total) by mouth 2 (two) times daily. Restart on 7/24 120 tablet 0  . aspirin EC 81 MG tablet Take 1 tablet (81 mg total) by mouth daily. Resume on 7/24    . budesonide (PULMICORT) 0.5 MG/2ML nebulizer solution Take 2 mLs (0.5 mg total) by nebulization 2 (two) times daily. 120 mL 5  . cloNIDine (CATAPRES) 0.1 MG tablet TAKE 1 TABLET BY MOUTH 2 TIMES A DAY 60 tablet 2  . diltiazem (CARDIZEM CD) 240 MG 24 hr capsule TAKE 1 CAPSULE BY MOUTH DAILY 30 capsule 2  . furosemide (LASIX) 40 MG tablet Take 1 tablet (40 mg total) by mouth daily. 90 tablet 0  . hydrocortisone (ANUSOL-HC) 25 MG suppository  Place 1 suppository (25 mg total) rectally every 12 (twelve) hours. 12 suppository 1  . Insulin Glargine (BASAGLAR KWIKPEN) 100 UNIT/ML SOPN Inject 20 Units into the skin 2 (two) times daily.    . insulin lispro (HUMALOG) 100 UNIT/ML KwikPen Junior Inject 0.08 mLs (8 Units total) into the skin 3 (three) times daily. 3 mL 3  . ipratropium (ATROVENT) 0.02 % nebulizer solution Take 2.5 mLs (0.5 mg total) by nebulization 4 (four) times daily. 25 mL 12  . ipratropium (ATROVENT) 0.02 % nebulizer solution INHALE THE CONTENTS OF 1 VIAL VIA NEBULIZATION 4 TIMES DAILY 75 mL 11  . losartan (COZAAR) 100 MG tablet TAKE 1 TABLET BY MOUTH DAILY 30 tablet 0  . metoprolol tartrate (LOPRESSOR) 25 MG tablet TAKE 1 TABLET BY MOUTH TWICE DAILY 60 tablet 0  . ONETOUCH DELICA LANCETS 66M MISC USE TO CHECK BLOOD SUGAR TWICE DAILY AS DIRECTED 100 each 2  . OXYGEN Inhale 3 L into the lungs daily.     . pantoprazole (PROTONIX) 40 MG tablet Take 1 tablet (40 mg total) by mouth 2 (two) times daily before a meal. 60 tablet 1  . polyethylene glycol (MIRALAX / GLYCOLAX) packet Take 17 g by mouth daily. 14 each 0  . potassium chloride SA (K-DUR,KLOR-CON) 20 MEQ tablet Take 1 tablet (20 mEq total) by mouth every  other day. When he takes lasix 90 tablet 0  . pravastatin (PRAVACHOL) 80 MG tablet Take 1 tablet (80 mg total) by mouth at bedtime. 30 tablet 3  . pravastatin (PRAVACHOL) 80 MG tablet TAKE 1 TABLET BY MOUTH AT BEDTIME 30 tablet 0  . predniSONE (DELTASONE) 20 MG tablet Take 2 daily for 4 days then take 1 for 2 days. 10 tablet 0   No facility-administered medications prior to visit.     Allergies:  Allergies  Allergen Reactions  . Ace Inhibitors Other (See Comments)    Hyperkalemia--07/23/2013:patient states not familiar with the following allergy      Review of Systems: See HPI for pertinent ROS. All other ROS negative.    Physical Exam: Blood pressure (!) 146/64, pulse 79, temperature 98.5 F (36.9 C),  temperature source Oral, resp. rate 20, height 5\' 5"  (1.651 m), weight 86.3 kg, SpO2 (!) 88 %., Body mass index is 31.65 kg/m. General: WM. Appears in no acute distress. HEENT: Normocephalic, atraumatic, eyes without discharge, sclera non-icteric, nares are without discharge. Bilateral auditory canals clear, TM's are without perforation, pearly grey and translucent with reflective cone of light bilaterally. Oral cavity moist, posterior pharynx without exudate, erythema.  Neck: Supple. No thyromegaly. No lymphadenopathy. Lungs: Breath sounds are distant, decreased throughout bilaterally.  I hear no wheezing at present. Heart: Regular rhythm. No murmurs, rubs, or gallops. Msk:  Strength and tone normal for age. Extremities/Skin: 1+ pitting edema bilaterally. Neuro: Alert and oriented X 3. Moves all extremities spontaneously. Gait is normal. CNII-XII grossly in tact. Psych:  Responds to questions appropriately with a normal affect.     ASSESSMENT AND PLAN:  83 y.o. year old male with   1. COPD exacerbation ([place]) He may also have a component of acute on chronic heart failure.  I considered obtaining a BNP.  However even if I obtained a BNP and it is elevated if I try to adjust his diuretic and medication he likely will not be compliant.  In the past even when I have written down and highlighted and verbally reviewed medication adjustments in the past he has not followed through and been compliant. Also his edema is minimal on exam especially compared to what he has had in the past. Will add a course of antibiotic and prednisone.  He will follow-up if worsens. - azithromycin (ZITHROMAX) 250 MG tablet; Day 1: Take 2 daily.  Days 2 -5: Take 1 daily.  Dispense: 6 tablet; Refill: 0 - predniSONE (DELTASONE) 20 MG tablet; Take 1 tablet (20 mg total) by mouth daily with breakfast.  Dispense: 5 tablet; Refill: 0  2. Acute on chronic respiratory failure with hypoxia ([name])  3. Acute on chronic systolic  congestive heart failure ([place])  4. CKD (chronic kidney disease), stage III ([place])  5. Non compliance w medication regimen   6. On home [address] [place], [place], [facility] 07/03/2018 2:29 PM

## 2018-07-04 ENCOUNTER — Other Ambulatory Visit: Payer: Self-pay

## 2018-07-04 NOTE — Patient Outreach (Signed)
[facility]) Care Management  07/04/2018  Edward [name] 1934/11/16 [id number]    3rd unsuccessful outreach attempt. Unable to leave voice message due to voice mailbox not being set up.   PLAN Pending case closure.   [place] [phone]

## 2018-07-05 ENCOUNTER — Telehealth: Payer: Self-pay | Admitting: Physician Assistant

## 2018-07-05 NOTE — Telephone Encounter (Signed)
pts son called and left a vm to call Zamere back about prescription ?'s did not state which meds he has ?'s on.

## 2018-07-05 NOTE — Telephone Encounter (Signed)
Call placed to patient no answer and no voicemail set up.

## 2018-07-09 ENCOUNTER — Other Ambulatory Visit: Payer: Self-pay

## 2018-07-09 ENCOUNTER — Encounter (HOSPITAL_COMMUNITY): Payer: Self-pay | Admitting: Internal Medicine

## 2018-07-09 ENCOUNTER — Inpatient Hospital Stay (HOSPITAL_BASED_OUTPATIENT_CLINIC_OR_DEPARTMENT_OTHER): Payer: Medicare Other | Admitting: Internal Medicine

## 2018-07-09 VITALS — BP 176/71 | HR 78 | Resp 22 | Wt 191.0 lb

## 2018-07-09 DIAGNOSIS — Z9981 Dependence on supplemental oxygen: Secondary | ICD-10-CM | POA: Diagnosis not present

## 2018-07-09 DIAGNOSIS — K5900 Constipation, unspecified: Secondary | ICD-10-CM | POA: Diagnosis not present

## 2018-07-09 DIAGNOSIS — I130 Hypertensive heart and chronic kidney disease with heart failure and stage 1 through stage 4 chronic kidney disease, or unspecified chronic kidney disease: Secondary | ICD-10-CM | POA: Diagnosis not present

## 2018-07-09 DIAGNOSIS — Z7982 Long term (current) use of aspirin: Secondary | ICD-10-CM | POA: Diagnosis not present

## 2018-07-09 DIAGNOSIS — Z7901 Long term (current) use of anticoagulants: Secondary | ICD-10-CM | POA: Diagnosis not present

## 2018-07-09 DIAGNOSIS — D500 Iron deficiency anemia secondary to blood loss (chronic): Secondary | ICD-10-CM | POA: Diagnosis not present

## 2018-07-09 DIAGNOSIS — Z794 Long term (current) use of insulin: Secondary | ICD-10-CM | POA: Diagnosis not present

## 2018-07-09 DIAGNOSIS — E559 Vitamin D deficiency, unspecified: Secondary | ICD-10-CM | POA: Diagnosis not present

## 2018-07-09 DIAGNOSIS — G4700 Insomnia, unspecified: Secondary | ICD-10-CM | POA: Diagnosis not present

## 2018-07-09 DIAGNOSIS — N183 Chronic kidney disease, stage 3 (moderate): Secondary | ICD-10-CM | POA: Diagnosis not present

## 2018-07-09 DIAGNOSIS — Z79899 Other long term (current) drug therapy: Secondary | ICD-10-CM

## 2018-07-09 DIAGNOSIS — I480 Paroxysmal atrial fibrillation: Secondary | ICD-10-CM

## 2018-07-09 DIAGNOSIS — K2980 Duodenitis without bleeding: Secondary | ICD-10-CM | POA: Diagnosis not present

## 2018-07-09 DIAGNOSIS — Z87891 Personal history of nicotine dependence: Secondary | ICD-10-CM | POA: Diagnosis not present

## 2018-07-09 DIAGNOSIS — E7800 Pure hypercholesterolemia, unspecified: Secondary | ICD-10-CM | POA: Diagnosis not present

## 2018-07-09 DIAGNOSIS — E1122 Type 2 diabetes mellitus with diabetic chronic kidney disease: Secondary | ICD-10-CM | POA: Diagnosis not present

## 2018-07-09 DIAGNOSIS — K219 Gastro-esophageal reflux disease without esophagitis: Secondary | ICD-10-CM | POA: Diagnosis not present

## 2018-07-09 DIAGNOSIS — J441 Chronic obstructive pulmonary disease with (acute) exacerbation: Secondary | ICD-10-CM | POA: Diagnosis not present

## 2018-07-09 NOTE — Progress Notes (Signed)
Diagnosis Iron deficiency anemia due to chronic blood loss - Plan: CBC with Differential/Platelet, Comprehensive metabolic panel, Lactate dehydrogenase, Ferritin  Staging Cancer Staging No matching staging information was found for the patient.  Assessment and Plan:   1.  Iron deficiency anemia.  Labs done 04/29/2018 showed a white count 6.8, hemoglobin 8.3, platelets 284,000.  Ferritin was decreased at 17.  Chemistries were within normal limits with potassium of 4.7.  His creatinine was 1.42 and liver function tests were normal.    Pt has an intolerance to oral iron due to gastritis and was treated with Injectafer on 05/07/2018 and 05/14/2018.    Labs done 07/02/2018 reviewed and showed WBC 4.9 Hb 11 plts 214,000.  Chemistries WNL with K+ 5.2 Cr 1.13, SPEP negative.  Ferritin 161.  Hb and ferritin are improved from 8.3 and 17 on labs done 04/29/2018.   He has undergone endoscopy and colonoscopy on 04/23/2018 with endoscopy showing benign stenosis.  There was also evidence of gastritis and duodenitis with biopsy negative for dysplasia.  Colonoscopy showed diverticulosis but the patient had a poor prep and he is recommended for additional evaluation.  He should follow-up with GI as recommended.  Pt will RTC in 10/2018 for follow-up and repeat labs.    2.  RI.  Cr 1.13.  SPEP negative.  Will repeat labs on RTC 10/2018.    3.  Hypertension.  Blood pressure 176/71.   Follow-up with PCP.  4.  Coronary artery disease/atrial fibrillation.  Patient is on Eliquis.  Follow-up with cardiology or PCP as recommended.  5.  COPD.  Pt on home O2.  Pulse ox 95% in clinic.    Interval history:  Historical data obtained from note dated 04/30/2018:  83 year old male referred by Dr. Oneida Alar for evaluation of anemia.  The patient had labs done on 04/29/2018 that showed a white count 6.8 hemoglobin 8.3 hematocrit 27.2 MCV was 79 platelets 284,000.  Ferritin was decreased at 17.  Chemistries within normal limits with a  creatinine of 1.42 liver function tests were normal.  Potassium was 4.7.  He has undergone endoscopy and colonoscopy on 04/23/2018.  Endoscopy showed benign stenosis.  There was also evidence of gastritis and duodenitis which was biopsied with no evidence of dysplasia noted.  Colonoscopy showed diverticulosis.  There was a question of internal hemorrhoids and the patient had a poor prep.  He has an intolerance to oral iron due to gastritis.    Current Status:  Pt is seen today for follow-up.  He is her to go over labs.  He tolerated IV iron without problems.    Problem List Patient Active Problem List   Diagnosis Date Noted  . Iron deficiency anemia due to chronic blood loss [D50.0] 04/30/2018  . Respiratory distress [R06.03]   . Hematochezia [K92.1]   . Goals of care, counseling/discussion [Z71.89]   . Palliative care by specialist [Z51.5]   . DNR (do not resuscitate) discussion [Z71.89]   . Acute renal failure with acute tubular necrosis superimposed on stage 3 chronic kidney disease ([place]) [N17.0, N18.3]   . Palliative care encounter [Z51.5]   . COPD exacerbation ([place]) [J44.1] 04/17/2018  . Acute on chronic congestive heart failure ([place]) [I50.9]   . Severe anemia [D64.9] 02/17/2018  . Bradycardia [R00.1] 02/17/2018  . Hypothermia [T68.XXXA]   . Gastroesophageal reflux disease [K21.9]   . Acute encephalopathy [G93.40] 09/25/2017  . On home O2 [Z99.81] 09/25/2017  . Insomnia [G47.00] 03/28/2017  . CKD (chronic kidney disease), stage  III ([place]) [N18.3] 03/07/2017  . AF (paroxysmal atrial fibrillation) ([place]) [I48.0] 03/05/2017  . Chronic diastolic CHF (congestive heart failure) ([place]) [I50.32] 03/04/2017  . HCAP (healthcare-associated pneumonia) [J18.9] 03/04/2017  . Chronic respiratory failure ([place]) [J96.10] 03/04/2017  . Sepsis due to pneumonia ([place]) [J18.9, A41.9] 02/25/2017  . Constipation [K59.00] 02/25/2017  . Overflow diarrhea/Constipation [R19.7] 02/25/2017  . Non compliance w  medication regimen [Z91.14] 12/02/2015  . Hypercholesterolemia [E78.00] 05/12/2014  . Acute on chronic respiratory failure with hypoxia ([place]) [J96.21] 11/17/2013  . Elevated PSA [R97.20] 01/20/2013  . Diabetes mellitus type 2, uncontrolled ([place]) [E11.65]   . COPD (chronic obstructive pulmonary disease) ([place]) [J44.9]   . Hypertension [I10]   . Elevated lipids [E78.5]   . Pulmonary fibrosis ([place]) [J84.10]   . Colon polyps [K63.5]   . Colon polyps [K63.5]     Past Medical History Past Medical History:  Diagnosis Date  . Allergy    Rhinitis  . Bronchitis   . Chronic respiratory failure ([employer])   . Colon polyps   . COPD (chronic obstructive pulmonary disease) ([place])   . Diabetes mellitus   . Elevated lipids   . Hypercholesterolemia   . Hypertension   . Iron deficiency anemia due to chronic blood loss 04/30/2018  . Noncompliance   . On home O2    2L N/C   . PSA elevation   . Pulmonary fibrosis ([place])   . Vitamin D deficiency     Past Surgical History Past Surgical History:  Procedure Laterality Date  . BIOPSY  04/23/2018   Procedure: BIOPSY;  Surgeon: Danie Binder, MD;  Location: [facility];  Service: Endoscopy;;  duodenum gastric  . CATARACT EXTRACTION W/PHACO  06/25/2012   Procedure: CATARACT EXTRACTION PHACO AND INTRAOCULAR LENS PLACEMENT (IOC);  Surgeon: Elta Guadeloupe T. Gershon Crane, MD;  Location: [facility] ORS;  Service: Ophthalmology;  Laterality: Left;  CDE=19.01  . CATARACT EXTRACTION W/PHACO  07/09/2012   Procedure: CATARACT EXTRACTION PHACO AND INTRAOCULAR LENS PLACEMENT (IOC);  Surgeon: Elta Guadeloupe T. Gershon Crane, MD;  Location: [facility];  Service: Ophthalmology;  Laterality: Right;  CDE: 20.09  . COLONOSCOPY WITH PROPOFOL N/A 04/23/2018   Procedure: COLONOSCOPY WITH PROPOFOL;  Surgeon: Danie Binder, MD;  Location: [facility];  Service: Endoscopy;  Laterality: N/A;  . ESOPHAGOGASTRODUODENOSCOPY (EGD) WITH PROPOFOL N/A 04/23/2018   Procedure: ESOPHAGOGASTRODUODENOSCOPY (EGD) WITH PROPOFOL;   Surgeon: Danie Binder, MD;  Location: [facility];  Service: Endoscopy;  Laterality: N/A;    Family History Family History  Problem Relation Age of Onset  . Heart disease Mother   . CAD Other   . Diabetes Other      Social History  reports that he quit smoking about 5 years ago. His smoking use included cigarettes. He has a 90.00 pack-year smoking history. He has never used smokeless tobacco. He reports that he does not drink alcohol or use drugs.  Medications  Current Outpatient Medications:  .  albuterol (PROVENTIL) (2.5 MG/3ML) 0.083% nebulizer solution, INHALE 1 VIAL VIA NEBULIZER EVERY 6 HOURS AS NEEDED FOR WHEEZING OR SHORTNESS OF BREATH, Disp: 360 mL, Rfl: 4 .  apixaban (ELIQUIS) 2.5 MG TABS tablet, Take 1 tablet (2.5 mg total) by mouth 2 (two) times daily. Restart on 7/24, Disp: 120 tablet, Rfl: 0 .  aspirin EC 81 MG tablet, Take 1 tablet (81 mg total) by mouth daily. Resume on 7/24, Disp: , Rfl:  .  azithromycin (ZITHROMAX) 250 MG tablet, Day 1: Take 2 daily.  Days 2 -  5: Take 1 daily., Disp: 6 tablet, Rfl: 0 .  budesonide (PULMICORT) 0.5 MG/2ML nebulizer solution, Take 2 mLs (0.5 mg total) by nebulization 2 (two) times daily., Disp: 120 mL, Rfl: 5 .  cloNIDine (CATAPRES) 0.1 MG tablet, TAKE 1 TABLET BY MOUTH 2 TIMES A DAY, Disp: 60 tablet, Rfl: 2 .  diltiazem (CARDIZEM CD) 240 MG 24 hr capsule, TAKE 1 CAPSULE BY MOUTH DAILY, Disp: 30 capsule, Rfl: 2 .  furosemide (LASIX) 40 MG tablet, Take 1 tablet (40 mg total) by mouth daily., Disp: 90 tablet, Rfl: 0 .  hydrocortisone (ANUSOL-HC) 25 MG suppository, Place 1 suppository (25 mg total) rectally every 12 (twelve) hours., Disp: 12 suppository, Rfl: 1 .  Insulin Glargine (BASAGLAR KWIKPEN) 100 UNIT/ML SOPN, Inject 20 Units into the skin 2 (two) times daily., Disp: , Rfl:  .  insulin lispro (HUMALOG) 100 UNIT/ML KwikPen Junior, Inject 0.08 mLs (8 Units total) into the skin 3 (three) times daily., Disp: 3 mL, Rfl: 3 .  ipratropium  (ATROVENT) 0.02 % nebulizer solution, INHALE THE CONTENTS OF 1 VIAL VIA NEBULIZATION 4 TIMES DAILY, Disp: 75 mL, Rfl: 11 .  LEVEMIR 100 UNIT/ML injection, INJECT 20 UNITS TOTAL INTO THE SKIN TWO TIMES DAILY, Disp: , Rfl: 3 .  losartan (COZAAR) 100 MG tablet, TAKE 1 TABLET BY MOUTH DAILY, Disp: 30 tablet, Rfl: 0 .  metoprolol tartrate (LOPRESSOR) 25 MG tablet, TAKE 1 TABLET BY MOUTH TWICE DAILY, Disp: 60 tablet, Rfl: 0 .  ONE TOUCH ULTRA TEST test strip, , Disp: , Rfl:  .  ONETOUCH DELICA LANCETS 12W MISC, USE TO CHECK BLOOD SUGAR TWICE DAILY AS DIRECTED, Disp: 100 each, Rfl: 2 .  OXYGEN, Inhale 3 L into the lungs daily. , Disp: , Rfl:  .  pantoprazole (PROTONIX) 40 MG tablet, Take 1 tablet (40 mg total) by mouth 2 (two) times daily before a meal., Disp: 60 tablet, Rfl: 1 .  polyethylene glycol (MIRALAX / GLYCOLAX) packet, Take 17 g by mouth daily., Disp: 14 each, Rfl: 0 .  potassium chloride SA (K-DUR,KLOR-CON) 20 MEQ tablet, Take 1 tablet (20 mEq total) by mouth every other day. When he takes lasix, Disp: 90 tablet, Rfl: 0 .  pravastatin (PRAVACHOL) 80 MG tablet, Take 1 tablet (80 mg total) by mouth at bedtime., Disp: 30 tablet, Rfl: 3 .  predniSONE (DELTASONE) 20 MG tablet, Take 1 tablet (20 mg total) by mouth daily with breakfast., Disp: 5 tablet, Rfl: 0  Allergies Ace inhibitors  Review of Systems Review of Systems - Oncology ROS negative   Physical Exam  Vitals Wt Readings from Last 3 Encounters:  07/09/18 191 lb (86.6 kg)  07/03/18 190 lb 3.2 oz (86.3 kg)  06/17/18 187 lb 12.8 oz (85.2 kg)   Temp Readings from Last 3 Encounters:  07/03/18 98.5 F (36.9 C) (Oral)  06/17/18 97.6 F (36.4 C) (Oral)  05/14/18 97.6 F (36.4 C) (Oral)   BP Readings from Last 3 Encounters:  07/09/18 (!) 176/71  07/03/18 (!) 146/64  06/17/18 126/60   Pulse Readings from Last 3 Encounters:  07/09/18 78  07/03/18 79  06/17/18 73   Constitutional: Well-developed, well-nourished, and in no  distress.   HENT: Head: Normocephalic and atraumatic.  Mouth/Throat: No oropharyngeal exudate. Mucosa moist. Eyes: Pupils are equal, round, and reactive to light. Conjunctivae are normal. No scleral icterus.  Neck: Normal range of motion. Neck supple. No JVD present.  Cardiovascular: Normal rate, regular rhythm and normal heart sounds.  Exam reveals no  gallop and no friction rub.   No murmur heard. Pulmonary/Chest: Effort normal and breath sounds normal. No respiratory distress. No wheezes.No rales.  Abdominal: Soft. Bowel sounds are normal. No distension. There is no tenderness. There is no guarding.  Musculoskeletal: No edema or tenderness.  Lymphadenopathy: No cervical or supraclavicular adenopathy.  Neurological: Alert and oriented to person, place, and time. No cranial nerve deficit.  Skin: Skin is warm and dry. No rash noted. No erythema. No pallor.  Psychiatric: Affect and judgment normal.   Labs No visits with results within 3 Day(s) from this visit.  Latest known visit with results is:  Appointment on 07/02/2018  Component Date Value Ref Range Status  . WBC 07/02/2018 4.9  4.0 - 10.5 K/uL Final  . RBC 07/02/2018 3.92* 4.22 - 5.81 MIL/uL Final  . Hemoglobin 07/02/2018 11.0* 13.0 - 17.0 g/dL Final  . HCT 07/02/2018 34.8* 39.0 - 52.0 % Final  . MCV 07/02/2018 88.8  78.0 - 100.0 fL Final  . MCH 07/02/2018 28.1  26.0 - 34.0 pg Final  . MCHC 07/02/2018 31.6  30.0 - 36.0 g/dL Final  . RDW 07/02/2018 19.0* 11.5 - 15.5 % Final  . Platelets 07/02/2018 214  150 - 400 K/uL Final  . Neutrophils Relative % 07/02/2018 54  % Final  . Neutro Abs 07/02/2018 2.6  1.7 - 7.7 K/uL Final  . Lymphocytes Relative 07/02/2018 26  % Final  . Lymphs Abs 07/02/2018 1.3  0.7 - 4.0 K/uL Final  . Monocytes Relative 07/02/2018 11  % Final  . Monocytes Absolute 07/02/2018 0.6  0.1 - 1.0 K/uL Final  . Eosinophils Relative 07/02/2018 9  % Final  . Eosinophils Absolute 07/02/2018 0.5  0.0 - 0.7 K/uL Final  .  Basophils Relative 07/02/2018 0  % Final  . Basophils Absolute 07/02/2018 0.0  0.0 - 0.1 K/uL Final   Performed at [facility], [address] [place], [place] [address]  . Sodium 07/02/2018 134* 135 - 145 mmol/L Final  . Potassium 07/02/2018 5.2* 3.5 - 5.1 mmol/L Final  . Chloride 07/02/2018 94* 98 - 111 mmol/L Final  . CO2 07/02/2018 32  22 - 32 mmol/L Final  . Glucose, Bld 07/02/2018 202* 70 - 99 mg/dL Final  . BUN 07/02/2018 25* 8 - 23 mg/dL Final  . Creatinine, Ser 07/02/2018 1.13  0.61 - 1.24 mg/dL Final  . Calcium 07/02/2018 8.8* 8.9 - 10.3 mg/dL Final  . Total Protein 07/02/2018 6.6  6.5 - 8.1 g/dL Final  . Albumin 07/02/2018 3.7  3.5 - 5.0 g/dL Final  . AST 07/02/2018 20  15 - 41 U/L Final  . ALT 07/02/2018 23  0 - 44 U/L Final  . Alkaline Phosphatase 07/02/2018 69  38 - 126 U/L Final  . Total Bilirubin 07/02/2018 0.3  0.3 - 1.2 mg/dL Final  . GFR calc non Af Amer 07/02/2018 59* >60 mL/min Final  . GFR calc Af Amer 07/02/2018 >60  >60 mL/min Final   Comment: (NOTE) The eGFR has been calculated using the CKD EPI equation. This calculation has not been validated in all clinical situations. eGFR's persistently <60 mL/min signify possible Chronic Kidney Disease.   Georgiann Hahn gap 07/02/2018 8  5 - 15 Final   Performed at [facility], [address], [place],  [address]  . LDH 07/02/2018 183  98 - 192 U/L Final   Performed at [facility], [address], [place],  [address]  . Total Protein ELP 07/02/2018 5.9* 6.0 -  8.5 g/dL Final  . Albumin ELP 07/02/2018 3.5  2.9 - 4.4 g/dL Final  . Alpha-1-Globulin 07/02/2018 0.2  0.0 - 0.4 g/dL Final  . Alpha-2-Globulin 07/02/2018 0.8  0.4 - 1.0 g/dL Final  . Beta Globulin 07/02/2018 0.9  0.7 - 1.3 g/dL Final  . Gamma Globulin 07/02/2018 0.6  0.4 - 1.8 g/dL Final  . M-Spike, % 07/02/2018 Not Observed  Not Observed g/dL Final  . SPE Interp. 07/02/2018 Comment   Final   Comment: (NOTE) The SPE pattern appears essentially  unremarkable. Evidence of monoclonal protein is not apparent. Performed At: [facility] [place], [place] [id number] Rush Farmer MD EE:[id number]   . Comment 07/02/2018 Comment   Final   Comment: (NOTE) Protein electrophoresis scan will follow via computer, mail, or courier delivery.   Marland Kitchen GLOBULIN, TOTAL 07/02/2018 2.4  2.2 - 3.9 g/dL Corrected  . A/G Ratio 07/02/2018 1.5  0.7 - 1.7 Corrected  . Ferritin 07/02/2018 161  24 - 336 ng/mL Final   Performed at [facility], [address], [place], [place] [address]  . Vitamin B-12 07/02/2018 259  180 - 914 pg/mL Final   Comment: (NOTE) This assay is not validated for testing neonatal or myeloproliferative syndrome specimens for Vitamin B12 levels. Performed at [facility], [address], [place], [place] [address]   . Folate 07/02/2018 9.7  >5.9 ng/mL Final   Performed at [facility], [address], [place],  [address]     Pathology Orders Placed This Encounter  Procedures  . CBC with Differential/Platelet    Standing Status:   Future    Standing Expiration Date:   07/09/2020  . Comprehensive metabolic panel    Standing Status:   Future    Standing Expiration Date:   07/09/2020  . Lactate dehydrogenase    Standing Status:   Future    Standing Expiration Date:   07/09/2020  . Ferritin    Standing Status:   Future    Standing Expiration Date:   07/09/2020       Zoila Shutter MD

## 2018-07-10 NOTE — Patient Outreach (Signed)
[facility]) Care Management  07/09/2018  [place] ABHIRAJ DOZAL 1935/06/13 [id number]    Member has not responded to phone calls or letter. Will notify physician and complete case closure.   [place] [phone]

## 2018-07-14 ENCOUNTER — Other Ambulatory Visit: Payer: Self-pay | Admitting: Physician Assistant

## 2018-07-14 DIAGNOSIS — J9611 Chronic respiratory failure with hypoxia: Secondary | ICD-10-CM

## 2018-07-14 DIAGNOSIS — J439 Emphysema, unspecified: Secondary | ICD-10-CM

## 2018-07-18 ENCOUNTER — Other Ambulatory Visit: Payer: Self-pay | Admitting: Physician Assistant

## 2018-07-18 DIAGNOSIS — J9611 Chronic respiratory failure with hypoxia: Secondary | ICD-10-CM

## 2018-07-18 DIAGNOSIS — J439 Emphysema, unspecified: Secondary | ICD-10-CM

## 2018-07-31 ENCOUNTER — Inpatient Hospital Stay (HOSPITAL_COMMUNITY)
Admission: EM | Admit: 2018-07-31 | Discharge: 2018-08-05 | DRG: 190 | Disposition: A | Discharge status: Home / Self Care | Payer: Medicare Other | Attending: Family Medicine | Admitting: Family Medicine

## 2018-07-31 ENCOUNTER — Emergency Department (HOSPITAL_COMMUNITY): Payer: Medicare Other

## 2018-07-31 ENCOUNTER — Other Ambulatory Visit: Payer: Self-pay

## 2018-07-31 ENCOUNTER — Encounter (HOSPITAL_COMMUNITY): Payer: Self-pay | Admitting: Emergency Medicine

## 2018-07-31 ENCOUNTER — Other Ambulatory Visit (HOSPITAL_COMMUNITY): Payer: Self-pay

## 2018-07-31 DIAGNOSIS — N183 Chronic kidney disease, stage 3 unspecified: Secondary | ICD-10-CM | POA: Diagnosis present

## 2018-07-31 DIAGNOSIS — T501X5A Adverse effect of loop [high-ceiling] diuretics, initial encounter: Secondary | ICD-10-CM | POA: Diagnosis not present

## 2018-07-31 DIAGNOSIS — Z888 Allergy status to other drugs, medicaments and biological substances status: Secondary | ICD-10-CM

## 2018-07-31 DIAGNOSIS — E1165 Type 2 diabetes mellitus with hyperglycemia: Secondary | ICD-10-CM | POA: Diagnosis not present

## 2018-07-31 DIAGNOSIS — E871 Hypo-osmolality and hyponatremia: Secondary | ICD-10-CM

## 2018-07-31 DIAGNOSIS — R0602 Shortness of breath: Secondary | ICD-10-CM | POA: Diagnosis not present

## 2018-07-31 DIAGNOSIS — I130 Hypertensive heart and chronic kidney disease with heart failure and stage 1 through stage 4 chronic kidney disease, or unspecified chronic kidney disease: Secondary | ICD-10-CM | POA: Diagnosis not present

## 2018-07-31 DIAGNOSIS — Z9981 Dependence on supplemental oxygen: Secondary | ICD-10-CM

## 2018-07-31 DIAGNOSIS — E11649 Type 2 diabetes mellitus with hypoglycemia without coma: Secondary | ICD-10-CM | POA: Diagnosis not present

## 2018-07-31 DIAGNOSIS — E875 Hyperkalemia: Secondary | ICD-10-CM | POA: Diagnosis not present

## 2018-07-31 DIAGNOSIS — Z7952 Long term (current) use of systemic steroids: Secondary | ICD-10-CM

## 2018-07-31 DIAGNOSIS — I480 Paroxysmal atrial fibrillation: Secondary | ICD-10-CM | POA: Diagnosis not present

## 2018-07-31 DIAGNOSIS — Z79899 Other long term (current) drug therapy: Secondary | ICD-10-CM | POA: Diagnosis not present

## 2018-07-31 DIAGNOSIS — J8410 Pulmonary fibrosis, unspecified: Secondary | ICD-10-CM | POA: Diagnosis present

## 2018-07-31 DIAGNOSIS — K219 Gastro-esophageal reflux disease without esophagitis: Secondary | ICD-10-CM | POA: Diagnosis not present

## 2018-07-31 DIAGNOSIS — J9611 Chronic respiratory failure with hypoxia: Secondary | ICD-10-CM | POA: Diagnosis not present

## 2018-07-31 DIAGNOSIS — Z7901 Long term (current) use of anticoagulants: Secondary | ICD-10-CM | POA: Diagnosis not present

## 2018-07-31 DIAGNOSIS — E1122 Type 2 diabetes mellitus with diabetic chronic kidney disease: Secondary | ICD-10-CM | POA: Diagnosis not present

## 2018-07-31 DIAGNOSIS — Z794 Long term (current) use of insulin: Secondary | ICD-10-CM | POA: Diagnosis not present

## 2018-07-31 DIAGNOSIS — R0603 Acute respiratory distress: Secondary | ICD-10-CM

## 2018-07-31 DIAGNOSIS — T380X5A Adverse effect of glucocorticoids and synthetic analogues, initial encounter: Secondary | ICD-10-CM | POA: Diagnosis not present

## 2018-07-31 DIAGNOSIS — I110 Hypertensive heart disease with heart failure: Secondary | ICD-10-CM | POA: Diagnosis not present

## 2018-07-31 DIAGNOSIS — Z09 Encounter for follow-up examination after completed treatment for conditions other than malignant neoplasm: Secondary | ICD-10-CM

## 2018-07-31 DIAGNOSIS — I5033 Acute on chronic diastolic (congestive) heart failure: Secondary | ICD-10-CM | POA: Diagnosis not present

## 2018-07-31 DIAGNOSIS — J9610 Chronic respiratory failure, unspecified whether with hypoxia or hypercapnia: Secondary | ICD-10-CM | POA: Diagnosis present

## 2018-07-31 DIAGNOSIS — N179 Acute kidney failure, unspecified: Secondary | ICD-10-CM

## 2018-07-31 DIAGNOSIS — I509 Heart failure, unspecified: Secondary | ICD-10-CM | POA: Diagnosis not present

## 2018-07-31 DIAGNOSIS — J9621 Acute and chronic respiratory failure with hypoxia: Secondary | ICD-10-CM | POA: Diagnosis not present

## 2018-07-31 DIAGNOSIS — IMO0002 Reserved for concepts with insufficient information to code with codable children: Secondary | ICD-10-CM | POA: Diagnosis present

## 2018-07-31 DIAGNOSIS — J441 Chronic obstructive pulmonary disease with (acute) exacerbation: Secondary | ICD-10-CM | POA: Diagnosis not present

## 2018-07-31 DIAGNOSIS — R600 Localized edema: Secondary | ICD-10-CM | POA: Diagnosis not present

## 2018-07-31 DIAGNOSIS — R609 Edema, unspecified: Secondary | ICD-10-CM

## 2018-07-31 DIAGNOSIS — I5032 Chronic diastolic (congestive) heart failure: Secondary | ICD-10-CM

## 2018-07-31 LAB — CBC WITH DIFFERENTIAL/PLATELET
ABS IMMATURE GRANULOCYTES: 0.02 10*3/uL (ref 0.00–0.07)
BASOS PCT: 1 %
Basophils Absolute: 0.1 10*3/uL (ref 0.0–0.1)
Eosinophils Absolute: 0.7 10*3/uL — ABNORMAL HIGH (ref 0.0–0.5)
Eosinophils Relative: 11 %
HCT: 33.3 % — ABNORMAL LOW (ref 39.0–52.0)
Hemoglobin: 10.4 g/dL — ABNORMAL LOW (ref 13.0–17.0)
IMMATURE GRANULOCYTES: 0 %
Lymphocytes Relative: 12 %
Lymphs Abs: 0.8 10*3/uL (ref 0.7–4.0)
MCH: 28 pg (ref 26.0–34.0)
MCHC: 31.2 g/dL (ref 30.0–36.0)
MCV: 89.5 fL (ref 80.0–100.0)
MONOS PCT: 12 %
Monocytes Absolute: 0.7 10*3/uL (ref 0.1–1.0)
NEUTROS ABS: 4 10*3/uL (ref 1.7–7.7)
NEUTROS PCT: 64 %
PLATELETS: 233 10*3/uL (ref 150–400)
RBC: 3.72 MIL/uL — ABNORMAL LOW (ref 4.22–5.81)
RDW: 16.3 % — ABNORMAL HIGH (ref 11.5–15.5)
WBC: 6.2 10*3/uL (ref 4.0–10.5)
nRBC: 0 % (ref 0.0–0.2)

## 2018-07-31 LAB — COMPREHENSIVE METABOLIC PANEL
ALK PHOS: 92 U/L (ref 38–126)
ALT: 19 U/L (ref 0–44)
ANION GAP: 8 (ref 5–15)
AST: 22 U/L (ref 15–41)
Albumin: 3.6 g/dL (ref 3.5–5.0)
BILIRUBIN TOTAL: 0.5 mg/dL (ref 0.3–1.2)
BUN: 23 mg/dL (ref 8–23)
CO2: 29 mmol/L (ref 22–32)
Calcium: 8.8 mg/dL — ABNORMAL LOW (ref 8.9–10.3)
Chloride: 89 mmol/L — ABNORMAL LOW (ref 98–111)
Creatinine, Ser: 1.13 mg/dL (ref 0.61–1.24)
GFR, EST NON AFRICAN AMERICAN: 59 mL/min — AB (ref >60)
Glucose, Bld: 234 mg/dL — ABNORMAL HIGH (ref 70–99)
Potassium: 5.1 mmol/L (ref 3.5–5.1)
Sodium: 126 mmol/L — ABNORMAL LOW (ref 135–145)
TOTAL PROTEIN: 6.4 g/dL — AB (ref 6.5–8.1)

## 2018-07-31 LAB — TROPONIN I: Troponin I: <0.03 ng/mL (ref <0.03)

## 2018-07-31 LAB — GLUCOSE, CAPILLARY: GLUCOSE-CAPILLARY: 255 mg/dL — AB (ref 70–99)

## 2018-07-31 LAB — BRAIN NATRIURETIC PEPTIDE: B NATRIURETIC PEPTIDE 5: 86 pg/mL (ref 0.0–100.0)

## 2018-07-31 MED ORDER — ONDANSETRON HCL 4 MG/2ML IJ SOLN: 4.0000 mg | Freq: Four times a day (QID) | INTRAMUSCULAR | Status: DC | PRN

## 2018-07-31 MED ORDER — FUROSEMIDE 10 MG/ML IJ SOLN
40.0000 mg | Freq: Two times a day (BID) | INTRAMUSCULAR | Status: DC
  Administered 2018-08-01 – 2018-08-02 (×3): 40 mg via INTRAVENOUS
  Filled 2018-07-31 (×3): qty 4

## 2018-07-31 MED ORDER — POLYETHYLENE GLYCOL 3350 17 G PO PACK
17.0000 g | PACK | Freq: Every day | ORAL | Status: DC
  Administered 2018-08-01 – 2018-08-05 (×5): 17 g via ORAL
  Filled 2018-07-31 (×5): qty 1

## 2018-07-31 MED ORDER — ALBUTEROL SULFATE (2.5 MG/3ML) 0.083% IN NEBU
2.5000 mg | INHALATION_SOLUTION | RESPIRATORY_TRACT | Status: DC | PRN
  Administered 2018-08-01 – 2018-08-04 (×3): 2.5 mg via RESPIRATORY_TRACT
  Filled 2018-07-31 (×3): qty 3

## 2018-07-31 MED ORDER — APIXABAN 2.5 MG PO TABS
2.5000 mg | ORAL_TABLET | Freq: Two times a day (BID) | ORAL | Status: DC
  Administered 2018-08-01 – 2018-08-05 (×10): 2.5 mg via ORAL
  Filled 2018-07-31 (×10): qty 1

## 2018-07-31 MED ORDER — IPRATROPIUM BROMIDE 0.02 % IN SOLN
0.5000 mg | Freq: Four times a day (QID) | RESPIRATORY_TRACT | Status: DC
  Administered 2018-07-31: 0.5 mg via RESPIRATORY_TRACT
  Filled 2018-07-31: qty 2.5

## 2018-07-31 MED ORDER — METHYLPREDNISOLONE SODIUM SUCC 125 MG IJ SOLR
125.0000 mg | Freq: Once | INTRAMUSCULAR | Status: AC
  Administered 2018-07-31: 125 mg via INTRAVENOUS
  Filled 2018-07-31: qty 2

## 2018-07-31 MED ORDER — INSULIN ASPART 100 UNIT/ML ~~LOC~~ SOLN
0.0000 [IU] | Freq: Every day | SUBCUTANEOUS | Status: DC
  Administered 2018-08-01 – 2018-08-03 (×4): 3 [IU] via SUBCUTANEOUS
  Administered 2018-08-04: 4 [IU] via SUBCUTANEOUS

## 2018-07-31 MED ORDER — SODIUM CHLORIDE 0.9 % IV SOLN
100.0000 mg | Freq: Two times a day (BID) | INTRAVENOUS | Status: DC
  Administered 2018-07-31 – 2018-08-01 (×3): 100 mg via INTRAVENOUS
  Filled 2018-07-31 (×7): qty 100

## 2018-07-31 MED ORDER — INSULIN GLARGINE 100 UNIT/ML ~~LOC~~ SOLN
10.0000 [IU] | Freq: Every day | SUBCUTANEOUS | Status: DC
  Administered 2018-08-01 (×2): 10 [IU] via SUBCUTANEOUS
  Filled 2018-07-31 (×2): qty 0.1

## 2018-07-31 MED ORDER — INSULIN ASPART 100 UNIT/ML ~~LOC~~ SOLN
0.0000 [IU] | Freq: Three times a day (TID) | SUBCUTANEOUS | Status: DC
  Administered 2018-08-01: 8 [IU] via SUBCUTANEOUS
  Administered 2018-08-01: 3 [IU] via SUBCUTANEOUS
  Administered 2018-08-01: 15 [IU] via SUBCUTANEOUS
  Administered 2018-08-02: 11 [IU] via SUBCUTANEOUS
  Administered 2018-08-02: 15 [IU] via SUBCUTANEOUS
  Administered 2018-08-03: 3 [IU] via SUBCUTANEOUS
  Administered 2018-08-03 – 2018-08-04 (×3): 8 [IU] via SUBCUTANEOUS
  Administered 2018-08-04: 5 [IU] via SUBCUTANEOUS
  Administered 2018-08-04: 2 [IU] via SUBCUTANEOUS

## 2018-07-31 MED ORDER — PANTOPRAZOLE SODIUM 40 MG PO TBEC
40.0000 mg | DELAYED_RELEASE_TABLET | Freq: Two times a day (BID) | ORAL | Status: DC
  Administered 2018-08-01 – 2018-08-05 (×9): 40 mg via ORAL
  Filled 2018-07-31 (×9): qty 1

## 2018-07-31 MED ORDER — ALBUTEROL (5 MG/ML) CONTINUOUS INHALATION SOLN
15.0000 mg/h | INHALATION_SOLUTION | RESPIRATORY_TRACT | Status: AC
  Administered 2018-07-31: 15 mg/h via RESPIRATORY_TRACT
  Filled 2018-07-31: qty 20

## 2018-07-31 MED ORDER — FUROSEMIDE 10 MG/ML IJ SOLN
40.0000 mg | INTRAMUSCULAR | Status: AC
  Administered 2018-07-31: 40 mg via INTRAVENOUS
  Filled 2018-07-31: qty 4

## 2018-07-31 MED ORDER — ASPIRIN EC 81 MG PO TBEC
81.0000 mg | DELAYED_RELEASE_TABLET | Freq: Every day | ORAL | Status: DC
  Administered 2018-08-01 – 2018-08-05 (×5): 81 mg via ORAL
  Filled 2018-07-31 (×5): qty 1

## 2018-07-31 MED ORDER — SODIUM CHLORIDE 0.9% FLUSH
3.0000 mL | Freq: Two times a day (BID) | INTRAVENOUS | Status: DC
  Administered 2018-08-01 – 2018-08-05 (×10): 3 mL via INTRAVENOUS

## 2018-07-31 MED ORDER — CLONIDINE HCL 0.1 MG PO TABS
0.1000 mg | ORAL_TABLET | Freq: Two times a day (BID) | ORAL | Status: DC
  Administered 2018-08-01 – 2018-08-05 (×10): 0.1 mg via ORAL
  Filled 2018-07-31 (×10): qty 1

## 2018-07-31 MED ORDER — METHYLPREDNISOLONE SODIUM SUCC 125 MG IJ SOLR
60.0000 mg | Freq: Four times a day (QID) | INTRAMUSCULAR | Status: DC
  Administered 2018-08-01 – 2018-08-02 (×6): 60 mg via INTRAVENOUS
  Filled 2018-07-31 (×6): qty 2

## 2018-07-31 MED ORDER — ACETAMINOPHEN 325 MG PO TABS: 650.0000 mg | ORAL_TABLET | ORAL | Status: DC | PRN

## 2018-07-31 MED ORDER — SODIUM CHLORIDE 0.9% FLUSH: 3.0000 mL | INTRAVENOUS | Status: DC | PRN

## 2018-07-31 MED ORDER — DILTIAZEM HCL ER COATED BEADS 240 MG PO CP24
240.0000 mg | ORAL_CAPSULE | Freq: Every day | ORAL | Status: DC
  Administered 2018-08-01 – 2018-08-05 (×5): 240 mg via ORAL
  Filled 2018-07-31 (×5): qty 1

## 2018-07-31 MED ORDER — PRAVASTATIN SODIUM 80 MG PO TABS
80.0000 mg | ORAL_TABLET | Freq: Every day | ORAL | Status: DC
  Administered 2018-08-02 – 2018-08-04 (×3): 80 mg via ORAL
  Filled 2018-07-31 (×4): qty 1

## 2018-07-31 MED ORDER — BUDESONIDE 0.5 MG/2ML IN SUSP
0.5000 mg | Freq: Two times a day (BID) | RESPIRATORY_TRACT | Status: DC
  Administered 2018-07-31 – 2018-08-05 (×10): 0.5 mg via RESPIRATORY_TRACT
  Filled 2018-07-31 (×10): qty 2

## 2018-07-31 MED ORDER — METOPROLOL TARTRATE 25 MG PO TABS
25.0000 mg | ORAL_TABLET | Freq: Two times a day (BID) | ORAL | Status: DC
  Administered 2018-08-01 – 2018-08-05 (×10): 25 mg via ORAL
  Filled 2018-07-31 (×10): qty 1

## 2018-07-31 MED ORDER — LOSARTAN POTASSIUM 25 MG PO TABS
100.0000 mg | ORAL_TABLET | Freq: Every day | ORAL | Status: DC
  Administered 2018-08-01: 100 mg via ORAL
  Filled 2018-07-31: qty 4

## 2018-07-31 MED ORDER — SODIUM CHLORIDE 0.9 % IV SOLN: 250.0000 mL | INTRAVENOUS | Status: DC | PRN

## 2018-07-31 NOTE — ED Triage Notes (Signed)
PT c/o SOB on exertion worsening over the past week with bilateral lower extremity edema. PT came into the ED without his oxygen on. Oxygen 3L via N/C applied at this time and sats improved to 91%.

## 2018-07-31 NOTE — ED Provider Notes (Signed)
[facility] EMERGENCY DEPARTMENT Provider Note   CSN: [id number] Arrival date & time: 07/31/18  1632     History   Chief Complaint Chief Complaint  Patient presents with  . Shortness of Breath    HPI Edward [name] is a 83 y.o. male.  HPI  The pt is an 83 y/o O2 dependent male - hx of pulmonary fibrosis COPD and CHF (recent echo in 12/18 - Grade 1 diastolic dysfunction) and paroxysmal afib (on eliquis), who presents with increased SOB on exertion and bilateral LE edema.  He has had no fevers, no headache, and has not seen his cardiologist recently - has had problems with anemia in the past - Hgb of 8.3.  Has had progressive swelling of his legs and DOE for 2 weeks - became severe today - and not getting any better with nebs which he uses daily.  He is taking his fluid pills - has not run out.  Sx are gradually worsening and have now become severe - no fevers, coughing but no phlegm.  Past Medical History:  Diagnosis Date  . Allergy    Rhinitis  . Bronchitis   . Chronic respiratory failure ([place])   . Colon polyps   . COPD (chronic obstructive pulmonary disease) ([place])   . Diabetes mellitus   . Elevated lipids   . Hypercholesterolemia   . Hypertension   . Iron deficiency anemia due to chronic blood loss 04/30/2018  . Noncompliance   . On home O2    2L N/C   . PSA elevation   . Pulmonary fibrosis ([place])   . Vitamin D deficiency     Patient Active Problem List   Diagnosis Date Noted  . Iron deficiency anemia due to chronic blood loss 04/30/2018  . Respiratory distress   . Hematochezia   . Goals of care, counseling/discussion   . Palliative care by specialist   . DNR (do not resuscitate) discussion   . Acute renal failure with acute tubular necrosis superimposed on stage 3 chronic kidney disease ([place])   . Palliative care encounter   . COPD exacerbation ([place]) 04/17/2018  . Acute on chronic congestive heart failure ([place])   . Severe anemia 02/17/2018  . Bradycardia  02/17/2018  . Hypothermia   . Gastroesophageal reflux disease   . Acute encephalopathy 09/25/2017  . On home O2 09/25/2017  . Insomnia 03/28/2017  . CKD (chronic kidney disease), stage III ([place]) 03/07/2017  . AF (paroxysmal atrial fibrillation) ([place]) 03/05/2017  . Chronic diastolic CHF (congestive heart failure) ([place]) 03/04/2017  . HCAP (healthcare-associated pneumonia) 03/04/2017  . Chronic respiratory failure ([place]) 03/04/2017  . Sepsis due to pneumonia ([place]) 02/25/2017  . Constipation 02/25/2017  . Overflow diarrhea/Constipation 02/25/2017  . Non compliance w medication regimen 12/02/2015  . Hypercholesterolemia 05/12/2014  . Acute on chronic respiratory failure with hypoxia ([place]) 11/17/2013  . Elevated PSA 01/20/2013  . Diabetes mellitus type 2, uncontrolled ([place])   . COPD (chronic obstructive pulmonary disease) ([place])   . Hypertension   . Elevated lipids   . Pulmonary fibrosis ([place])   . Colon polyps   . Colon polyps     Past Surgical History:  Procedure Laterality Date  . BIOPSY  04/23/2018   Procedure: BIOPSY;  Surgeon: Danie Binder, MD;  Location: [facility] ENDO SUITE;  Service: Endoscopy;;  duodenum gastric  . CATARACT EXTRACTION W/PHACO  06/25/2012   Procedure: CATARACT EXTRACTION PHACO AND INTRAOCULAR LENS PLACEMENT (IOC);  Surgeon: Elta Guadeloupe T. Gershon Crane, MD;  Location:  [facility] ORS;  Service: Ophthalmology;  Laterality: Left;  CDE=19.01  . CATARACT EXTRACTION W/PHACO  07/09/2012   Procedure: CATARACT EXTRACTION PHACO AND INTRAOCULAR LENS PLACEMENT (IOC);  Surgeon: Elta Guadeloupe T. Gershon Crane, MD;  Location: [facility] ORS;  Service: Ophthalmology;  Laterality: Right;  CDE: 20.09  . COLONOSCOPY WITH PROPOFOL N/A 04/23/2018   Procedure: COLONOSCOPY WITH PROPOFOL;  Surgeon: Danie Binder, MD;  Location: [facility];  Service: Endoscopy;  Laterality: N/A;  . ESOPHAGOGASTRODUODENOSCOPY (EGD) WITH PROPOFOL N/A 04/23/2018   Procedure: ESOPHAGOGASTRODUODENOSCOPY (EGD) WITH PROPOFOL;  Surgeon: Danie Binder, MD;   Location: [facility];  Service: Endoscopy;  Laterality: N/A;        Home Medications    Prior to Admission medications   Medication Sig Start Date End Date Taking? Authorizing Provider  albuterol (PROVENTIL) (2.5 MG/3ML) 0.083% nebulizer solution INHALE 1 VIAL VIA NEBULIZER EVERY 6 HOURS AS NEEDED FOR WHEEZING OR SHORTNESS OF BREATH 07/03/18   Orlena Sheldon, PA-C  apixaban (ELIQUIS) 2.5 MG TABS tablet Take 1 tablet (2.5 mg total) by mouth 2 (two) times daily. Restart on 7/24 04/23/18   Kathie Dike, MD  aspirin EC 81 MG tablet Take 1 tablet (81 mg total) by mouth daily. Resume on 7/24 04/23/18   Kathie Dike, MD  azithromycin (ZITHROMAX) 250 MG tablet Day 1: Take 2 daily.  Days 2 -5: Take 1 daily. 07/03/18   Dena Billet B, PA-C  budesonide (PULMICORT) 0.5 MG/2ML nebulizer solution Take 2 mLs (0.5 mg total) by nebulization 2 (two) times daily. 02/05/18   Dena Billet B, PA-C  cloNIDine (CATAPRES) 0.1 MG tablet TAKE 1 TABLET BY MOUTH 2 TIMES A DAY 06/06/18   Dena Billet B, PA-C  diltiazem (CARDIZEM CD) 240 MG 24 hr capsule TAKE 1 CAPSULE BY MOUTH DAILY 04/01/18   Orlena Sheldon, PA-C  furosemide (LASIX) 40 MG tablet Take 1 tablet (40 mg total) by mouth daily. 04/23/18 04/23/19  Kathie Dike, MD  hydrocortisone (ANUSOL-HC) 25 MG suppository Place 1 suppository (25 mg total) rectally every 12 (twelve) hours. 04/23/18 04/23/19  Kathie Dike, MD  Insulin Glargine (BASAGLAR KWIKPEN) 100 UNIT/ML SOPN Inject 20 Units into the skin 2 (two) times daily.    [provider]  insulin lispro (HUMALOG) 100 UNIT/ML KwikPen Junior Inject 0.08 mLs (8 Units total) into the skin 3 (three) times daily. 02/20/18   Purohit, [name], MD  ipratropium (ATROVENT) 0.02 % nebulizer solution INHALE THE CONTENTS OF 1 VIAL VIA NEBULIZATION 4 TIMES DAILY 06/06/18   Dixon, Mary B, PA-C  LEVEMIR 100 UNIT/ML injection INJECT 20 UNITS TOTAL INTO THE SKIN TWO TIMES DAILY 05/08/18   [provider]  losartan (COZAAR)  100 MG tablet TAKE 1 TABLET BY MOUTH DAILY 07/03/18   Dena Billet B, PA-C  metoprolol tartrate (LOPRESSOR) 25 MG tablet TAKE 1 TABLET BY MOUTH TWICE DAILY 07/03/18   Dena Billet B, PA-C  ONE TOUCH ULTRA TEST test strip  06/26/18   [provider]  [facility] DELICA LANCETS 61Y MISC USE TO CHECK BLOOD SUGAR TWICE DAILY AS DIRECTED 02/22/18   Dena Billet B, PA-C  OXYGEN Inhale 3 L into the lungs daily.     [provider]  pantoprazole (PROTONIX) 40 MG tablet Take 1 tablet (40 mg total) by mouth 2 (two) times daily before a meal. 04/23/18   Kathie Dike, MD  polyethylene glycol (MIRALAX / GLYCOLAX) packet Take 17 g by mouth daily. 04/24/18   Kathie Dike, MD  potassium chloride SA (  K-DUR,KLOR-CON) 20 MEQ tablet Take 1 tablet (20 mEq total) by mouth every other day. When he takes lasix 02/28/18   Dena Billet B, PA-C  pravastatin (PRAVACHOL) 80 MG tablet Take 1 tablet (80 mg total) by mouth at bedtime. 02/05/18   Orlena Sheldon, PA-C  predniSONE (DELTASONE) 20 MG tablet Take 1 tablet (20 mg total) by mouth daily with breakfast. 07/03/18   Dena Billet B, PA-C  predniSONE (DELTASONE) 20 MG tablet TAKE 2 TABLETS BY MOUTH ONCE DAILY FOR 4 DAYS, THEN 1 TABLET ONCE DAILY FOR 2 DAYS 07/19/18   Orlena Sheldon, PA-C    Family History Family History  Problem Relation Age of Onset  . Heart disease Mother   . CAD Other   . Diabetes Other     Social History Social History   Tobacco Use  . Smoking status: Former Smoker    Packs/day: 1.50    Years: 60.00    Pack years: 90.00    Types: Cigarettes    Last attempt to quit: 12/31/2012    Years since quitting: 5.5  . Smokeless tobacco: Never Used  Substance Use Topics  . Alcohol use: No  . Drug use: No     Allergies   Ace inhibitors   Review of Systems Review of Systems  All other systems reviewed and are negative.    Physical Exam Updated Vital Signs BP (!) 178/95   Pulse 100   Temp 98 F (36.7 C) (Oral)   Resp (!) 32   Ht  1.676 m (5\' 6" )   Wt 83.9 kg   SpO2 98%   BMI 29.86 kg/m   Physical Exam  Constitutional: He appears well-developed and well-nourished. He appears distressed ( respiratory distress).  HENT:  Head: Normocephalic and atraumatic.  Mouth/Throat: Oropharynx is clear and moist. No oropharyngeal exudate.  Eyes: Pupils are equal, round, and reactive to light. Conjunctivae and EOM are normal. Right eye exhibits no discharge. Left eye exhibits no discharge. No scleral icterus.  Neck: Normal range of motion. Neck supple. No JVD present. No thyromegaly present.  Cardiovascular: Regular rhythm, normal heart sounds and intact distal pulses. Exam reveals no gallop and no friction rub.  No murmur heard. tachycardia  Pulmonary/Chest: Tachypnea noted. He is in respiratory distress. He has wheezes ( all lung fields). He has rales.  Abdominal: Soft. Bowel sounds are normal. He exhibits distension. He exhibits no mass. There is no tenderness.  Has mild anasarca - tight stomach, no tympanitic sounds to percussion  Musculoskeletal: Normal range of motion. He exhibits edema ( 3+ bilatearlly tight pitting edema). He exhibits no tenderness.  Lymphadenopathy:    He has no cervical adenopathy.  Neurological: He is alert. Coordination normal.  Skin: Skin is warm and dry. No rash noted. No erythema.  Psychiatric: He has a normal mood and affect. His behavior is normal.  Nursing note and vitals reviewed.    ED Treatments / Results  Labs (all labs ordered are listed, but only abnormal results are displayed) Labs Reviewed  CBC WITH DIFFERENTIAL/PLATELET - Abnormal; Notable for the following components:      Result Value   RBC 3.72 (*)    Hemoglobin 10.4 (*)    HCT 33.3 (*)    RDW 16.3 (*)    Eosinophils Absolute 0.7 (*)    All other components within normal limits  COMPREHENSIVE METABOLIC PANEL - Abnormal; Notable for the following components:   Sodium 126 (*)    Chloride 89 (*)  Glucose, Bld 234 (*)      Calcium 8.8 (*)    Total Protein 6.4 (*)    GFR calc non Af Amer 59 (*)    All other components within normal limits  TROPONIN I  BRAIN NATRIURETIC PEPTIDE   ED ECG REPORT  I personally interpreted this EKG   Date: 07/31/2018   Rate: 103  Rhythm: normal sinus rhythm  QRS Axis: normal  Intervals: normal  ST/T Wave abnormalities: normal  Conduction Disutrbances:none  Narrative Interpretation:   Old EKG Reviewed: changes noted =- now more tachycardic  Radiology Dg Chest 2 View  Result Date: 07/31/2018 CLINICAL DATA:  Shortness of breath. EXAM: CHEST - 2 VIEW COMPARISON:  Radiograph April 17, 2018. FINDINGS: Stable cardiomediastinal silhouette. Stable interstitial densities are noted throughout both lungs most consistent with scarring. No acute abnormality is noted. Bony thorax is unremarkable. No pneumothorax or pleural effusion is noted. IMPRESSION: Stable interstitial densities noted throughout both lungs most consistent with scarring. No definite acute abnormality is noted. Electronically Signed   By: Marijo Conception, M.D.   On: 07/31/2018 17:34    Procedures .Critical Care Performed by: Noemi Chapel, MD Authorized by: Noemi Chapel, MD   Critical care provider statement:    Critical care time (minutes):  35   Critical care time was exclusive of:  Separately billable procedures and treating other patients and teaching time   Critical care was necessary to treat or prevent imminent or life-threatening deterioration of the following conditions:  Respiratory failure   Critical care was time spent personally by me on the following activities:  Blood draw for specimens, development of treatment plan with patient or surrogate, discussions with consultants, evaluation of patient's response to treatment, examination of patient, obtaining history from patient or surrogate, ordering and performing treatments and interventions, ordering and review of laboratory studies, ordering and review  of radiographic studies, pulse oximetry, re-evaluation of patient's condition and review of old charts   (including critical care time)  Medications Ordered in ED Medications  albuterol (PROVENTIL,VENTOLIN) solution continuous neb (15 mg/hr Nebulization New Bag/Given 07/31/18 1924)  furosemide (LASIX) injection 40 mg (40 mg Intravenous Given 07/31/18 1853)  methylPREDNISolone sodium succinate (SOLU-MEDROL) 125 mg/2 mL injection 125 mg (125 mg Intravenous Given 07/31/18 1853)     Initial Impression / Assessment and Plan / ED Course  I have reviewed the triage vital signs and the nursing notes.  Pertinent labs & imaging results that were available during my care of the patient were reviewed by me and considered in my medical decision making (see chart for details).  Clinical Course as of Jul 31 1945  Wed Jul 31, 2018  6712 The patient is hyponatremic with a sodium of 126, potassium is normal, renal function is preserved, liver function shows normal albumin and blood counts troponin and BNP are unremarkable.  X-ray without fluid overload.  I am concerned given the patient's increasing fluid overload status that he would likely needs more diuresis but in the presence of hyponatremia it does raise concern for worsening electro light abnormalities.  Given his already deconditioned state he will likely need to have this done as an inpatient.   [BM]    Clinical Course User Index [BM] Noemi Chapel, MD    The patient's x-ray reveals some interstitial densities, this is bilateral, this is not new, there is no other signs of acute infection or fluid such as effusions or pulmonary edema.  The patient has significant abnormal findings on clinical  exam to suggest fluid overload, he feels like he is gained weight, he does not fit into his shoes, he is having difficulty with severe swelling and this is progressing to shortness of breath.  He has 3 L of oxygen by nasal cannula at all times, his oxygen is  96% at this time.  He is wheezing diffusely which may be related to COPD but may also be related to congestive heart failure and a cardiac wheeze.  We will proceed with Lasix, labs, his EKG shows a sinus tachycardia but no signs of acute ischemia or arrhythmia.  The patient is agreeable  Due to the constellation of findings including hyponatremia, fluid overloaded state, ongoing shortness of breath and respiratory distress after continuous nebulizer therapy the decision was made to the admit the patient to the hospital.  He is agreeable, Dr. Mitzi Hansen has been kind enough to admit the patient.  Final Clinical Impressions(s) / ED Diagnoses   Final diagnoses:  Respiratory distress  Acute on chronic diastolic congestive heart failure ([place])  Peripheral edema  Hyponatremia    ED Discharge Orders    None       Noemi Chapel, MD 07/31/18 1946

## 2018-07-31 NOTE — ED Notes (Signed)
Son and spouse notified of patient transfer to [facility] per patient request.

## 2018-07-31 NOTE — H&P (Addendum)
History and Physical    WILTON THRALL [name]:[id number] DOB: 01-Jan-1935 DOA: 07/31/2018  PCP: Orlena Sheldon, PA-C   Patient coming from: Home   Chief Complaint: Swelling, SOB   HPI: Edward [name] is a 83 y.o. male with medical history significant for COPD with chronic hypoxic respiratory failure, chronic diastolic CHF, atrial fibrillation on Eliquis, insulin-dependent diabetes mellitus, and chronic kidney disease stage III, now presenting to the emergency department for evaluation of worsening leg swelling and shortness of breath.  The patient was treated with azithromycin and steroid taper earlier this month, improved some, but has continued to worsen again over the past week.  He denies any chest pain or palpitations, but reports increased leg swelling over this interval, as well as increased wheezing and productive cough.  No fevers or chills.  Leg swelling is symmetric and there is no calf tenderness.  ED Course: Upon arrival to the ED, patient is found to be afebrile, saturating 86% on his usual 3 L/min supplemental oxygen, tachypneic in the 30s, slightly tachycardic, and mildly hypertensive.  EKG features a sinus rhythm with nonspecific ST abnormality.  Chest x-ray is notable for stable interstitial densities most suggestive of chronic scarring.  CBC is notable for a normocytic anemia and chemistry panel features a glucose of 234 and sodium 126.  Patient was given 40 mg IV Lasix, 125 mg IV Solu-Medrol, and albuterol neb in the ED.  He reports some slight improvement with this, remains hemodynamically stable, and will be observed for ongoing evaluation and management of worsening dyspnea, cough, wheezing, and leg swelling.  Review of Systems:  All other systems reviewed and apart from HPI, are negative.  Past Medical History:  Diagnosis Date  . Allergy    Rhinitis  . Bronchitis   . Chronic respiratory failure ([place])   . Colon polyps   . COPD (chronic obstructive pulmonary disease) ([place])     . Diabetes mellitus   . Elevated lipids   . Hypercholesterolemia   . Hypertension   . Iron deficiency anemia due to chronic blood loss 04/30/2018  . Noncompliance   . On home O2    2L N/C   . PSA elevation   . Pulmonary fibrosis ([place])   . Vitamin D deficiency     Past Surgical History:  Procedure Laterality Date  . BIOPSY  04/23/2018   Procedure: BIOPSY;  Surgeon: Danie Binder, MD;  Location: [facility];  Service: Endoscopy;;  duodenum gastric  . CATARACT EXTRACTION W/PHACO  06/25/2012   Procedure: CATARACT EXTRACTION PHACO AND INTRAOCULAR LENS PLACEMENT (IOC);  Surgeon: Elta Guadeloupe T. Gershon Crane, MD;  Location: [facility];  Service: Ophthalmology;  Laterality: Left;  CDE=19.01  . CATARACT EXTRACTION W/PHACO  07/09/2012   Procedure: CATARACT EXTRACTION PHACO AND INTRAOCULAR LENS PLACEMENT (IOC);  Surgeon: Elta Guadeloupe T. Gershon Crane, MD;  Location: [facility];  Service: Ophthalmology;  Laterality: Right;  CDE: 20.09  . COLONOSCOPY WITH PROPOFOL N/A 04/23/2018   Procedure: COLONOSCOPY WITH PROPOFOL;  Surgeon: Danie Binder, MD;  Location: [facility];  Service: Endoscopy;  Laterality: N/A;  . ESOPHAGOGASTRODUODENOSCOPY (EGD) WITH PROPOFOL N/A 04/23/2018   Procedure: ESOPHAGOGASTRODUODENOSCOPY (EGD) WITH PROPOFOL;  Surgeon: Danie Binder, MD;  Location: [facility];  Service: Endoscopy;  Laterality: N/A;     reports that he quit smoking about 5 years ago. His smoking use included cigarettes. He has a 90.00 pack-year smoking history. He has never used smokeless tobacco. He reports that he does not drink alcohol or  use drugs.  Allergies  Allergen Reactions  . Ace Inhibitors Other (See Comments)    Hyperkalemia--07/23/2013:patient states not familiar with the following allergy    Family History  Problem Relation Age of Onset  . Heart disease Mother   . CAD Other   . Diabetes Other      Prior to Admission medications   Medication Sig Start Date End Date Taking? Authorizing Provider  albuterol  (PROVENTIL) (2.5 MG/3ML) 0.083% nebulizer solution INHALE 1 VIAL VIA NEBULIZER EVERY 6 HOURS AS NEEDED FOR WHEEZING OR SHORTNESS OF BREATH 07/03/18   Orlena Sheldon, PA-C  apixaban (ELIQUIS) 2.5 MG TABS tablet Take 1 tablet (2.5 mg total) by mouth 2 (two) times daily. Restart on 7/24 04/23/18   Kathie Dike, MD  aspirin EC 81 MG tablet Take 1 tablet (81 mg total) by mouth daily. Resume on 7/24 04/23/18   Kathie Dike, MD  budesonide (PULMICORT) 0.5 MG/2ML nebulizer solution Take 2 mLs (0.5 mg total) by nebulization 2 (two) times daily. 02/05/18   Dena Billet B, PA-C  cloNIDine (CATAPRES) 0.1 MG tablet TAKE 1 TABLET BY MOUTH 2 TIMES A DAY 06/06/18   Dena Billet B, PA-C  diltiazem (CARDIZEM CD) 240 MG 24 hr capsule TAKE 1 CAPSULE BY MOUTH DAILY 04/01/18   Orlena Sheldon, PA-C  furosemide (LASIX) 40 MG tablet Take 1 tablet (40 mg total) by mouth daily. 04/23/18 04/23/19  Kathie Dike, MD  hydrocortisone (ANUSOL-HC) 25 MG suppository Place 1 suppository (25 mg total) rectally every 12 (twelve) hours. 04/23/18 04/23/19  Kathie Dike, MD  Insulin Glargine (BASAGLAR KWIKPEN) 100 UNIT/ML SOPN Inject 20 Units into the skin 2 (two) times daily.    [provider]  insulin lispro (HUMALOG) 100 UNIT/ML KwikPen Junior Inject 0.08 mLs (8 Units total) into the skin 3 (three) times daily. 02/20/18   Purohit, [name], MD  ipratropium (ATROVENT) 0.02 % nebulizer solution INHALE THE CONTENTS OF 1 VIAL VIA NEBULIZATION 4 TIMES DAILY 06/06/18   Dixon, Mary B, PA-C  LEVEMIR 100 UNIT/ML injection INJECT 20 UNITS TOTAL INTO THE SKIN TWO TIMES DAILY 05/08/18   [provider]  losartan (COZAAR) 100 MG tablet TAKE 1 TABLET BY MOUTH DAILY 07/03/18   Dena Billet B, PA-C  metoprolol tartrate (LOPRESSOR) 25 MG tablet TAKE 1 TABLET BY MOUTH TWICE DAILY 07/03/18   Dena Billet B, PA-C  ONE TOUCH ULTRA TEST test strip  06/26/18   [provider]  [facility] DELICA LANCETS 11X MISC USE TO CHECK BLOOD SUGAR TWICE DAILY AS  DIRECTED 02/22/18   Dena Billet B, PA-C  OXYGEN Inhale 3 L into the lungs daily.     [provider]  pantoprazole (PROTONIX) 40 MG tablet Take 1 tablet (40 mg total) by mouth 2 (two) times daily before a meal. 04/23/18   Kathie Dike, MD  polyethylene glycol (MIRALAX / GLYCOLAX) packet Take 17 g by mouth daily. 04/24/18   Kathie Dike, MD  potassium chloride SA (K-DUR,KLOR-CON) 20 MEQ tablet Take 1 tablet (20 mEq total) by mouth every other day. When he takes lasix 02/28/18   Dena Billet B, PA-C  pravastatin (PRAVACHOL) 80 MG tablet Take 1 tablet (80 mg total) by mouth at bedtime. 02/05/18   Orlena Sheldon, PA-C  predniSONE (DELTASONE) 20 MG tablet Take 1 tablet (20 mg total) by mouth daily with breakfast. 07/03/18   Orlena Sheldon, PA-C    Physical Exam: Vitals:   07/31/18 1845 07/31/18 1854 07/31/18 1900 07/31/18 1926  BP:  (!) 175/77 (!) 178/95   Pulse: (!) 101 98 100   Resp: 18  (!) 32   Temp:  98 F (36.7 C)    TempSrc:  Oral    SpO2: (!) 89% 96% 96% 98%  Weight:      Height:        Constitutional: NAD, calm  Eyes: PERTLA, lids and conjunctivae normal ENMT: Mucous membranes are moist. Posterior pharynx clear of any exudate or lesions.   Neck: normal, supple, no masses, no thyromegaly Respiratory: Diminished bilaterally with prolonged expiratory phase and wheezes. Tachypnea. No accessory muscle use.  Cardiovascular: Rate ~100 and regular. 2+ pretibial edema bilaterally. Abdomen: No distension, no tenderness, soft. Bowel sounds active.   Musculoskeletal: no clubbing / cyanosis. No joint deformity upper and lower extremities.   Skin: no significant rashes, lesions, ulcers. Warm, dry, well-perfused. Neurologic: No facial asymmetry. Sensation intact. Moving all extremities.  Psychiatric: Alert and oriented to person, place, and situation. Calm, cooperative.    Labs on Admission: I have personally reviewed following labs and imaging studies  CBC: Recent Labs  Lab  07/31/18 1731  WBC 6.2  NEUTROABS 4.0  HGB 10.4*  HCT 33.3*  MCV 89.5  PLT 329   Basic Metabolic Panel: Recent Labs  Lab 07/31/18 1731  NA 126*  K 5.1  CL 89*  CO2 29  GLUCOSE 234*  BUN 23  CREATININE 1.13  CALCIUM 8.8*   GFR: Estimated Creatinine Clearance: 52.1 mL/min (by C-G formula based on SCr of 1.13 mg/dL). Liver Function Tests: Recent Labs  Lab 07/31/18 1731  AST 22  ALT 19  ALKPHOS 92  BILITOT 0.5  PROT 6.4*  ALBUMIN 3.6   No results for input(s): LIPASE, AMYLASE in the last 168 hours. No results for input(s): AMMONIA in the last 168 hours. Coagulation Profile: No results for input(s): INR, PROTIME in the last 168 hours. Cardiac Enzymes: Recent Labs  Lab 07/31/18 1731  TROPONINI <0.03   BNP (last 3 results) No results for input(s): PROBNP in the last 8760 hours. HbA1C: No results for input(s): HGBA1C in the last 72 hours. CBG: No results for input(s): GLUCAP in the last 168 hours. Lipid Profile: No results for input(s): CHOL, HDL, LDLCALC, TRIG, CHOLHDL, LDLDIRECT in the last 72 hours. Thyroid Function Tests: No results for input(s): TSH, T4TOTAL, FREET4, T3FREE, THYROIDAB in the last 72 hours. Anemia Panel: No results for input(s): VITAMINB12, FOLATE, FERRITIN, TIBC, IRON, RETICCTPCT in the last 72 hours. Urine analysis:    Component Value Date/Time   COLORURINE STRAW (A) 02/06/2018 1142   APPEARANCEUR CLEAR 02/06/2018 1142   LABSPEC 1.013 02/06/2018 1142   PHURINE 6.0 02/06/2018 1142   GLUCOSEU >=500 (A) 02/06/2018 1142   HGBUR SMALL (A) 02/06/2018 1142   BILIRUBINUR NEGATIVE 02/06/2018 1142   KETONESUR NEGATIVE 02/06/2018 1142   PROTEINUR NEGATIVE 02/06/2018 1142   NITRITE NEGATIVE 02/06/2018 1142   LEUKOCYTESUR NEGATIVE 02/06/2018 1142   Sepsis Labs: @LABRCNTIP (procalcitonin:4,lacticidven:4) )No results found for this or any previous visit (from the past 240 hour(s)).   Radiological Exams on Admission: Dg Chest 2 View  Result  Date: 07/31/2018 CLINICAL DATA:  Shortness of breath. EXAM: CHEST - 2 VIEW COMPARISON:  Radiograph April 17, 2018. FINDINGS: Stable cardiomediastinal silhouette. Stable interstitial densities are noted throughout both lungs most consistent with scarring. No acute abnormality is noted. Bony thorax is unremarkable. No pneumothorax or pleural effusion is noted. IMPRESSION: Stable interstitial densities noted throughout both lungs most consistent with scarring. No definite  acute abnormality is noted. Electronically Signed   By: Marijo Conception, M.D.   On: 07/31/2018 17:34    EKG: Independently reviewed. Sinus rhythm, non-specific ST abnormality.   Assessment/Plan   1. COPD with acute exacerbation  - Presents with worsening dyspnea and edema, found to have diffuse wheezes  - Improved some after treatment with steroids, nebs, and Lasix in ED  - Check sputum culture, continue systemic steroids, start doxycycline, continue Pulmicort, Atrovent, and as-needed albuterol nebs   2. Acute on chronic diastolic CHF  - Presents with worsening dyspnea and edema  - Found to have diffuse wheezes, increased WOB  - Improved some after treatment with steroids, nebs, and Lasix in ED  - Continue diuresis with Lasix 40 mg IV q12h, continue ARB and beta-blocker, follow daily wts    3. Atrial fibrillation  - In sinus rhythm in ED   - CHADS-VASc at least 4 (age x2, DM, CHF)  - Continue Eliquis, diltiazem, metoprolol    4. Hyponatremia  - Serum sodium is 126 on admission in setting of hyperglycemia and hypervolemia  - Continue diuresis and glycemic-control, repeat chem panel in am   5. Insulin-dependent DM  - A1c was 6.9% in June  - Managed with Levemir and Humalog at home  - Continue basal insulin with a [employer] and adjust as needed    6. CKD stage III  - SCr is 1.13 on admission, similar to priors  - Renally-dose medications, follow daily chem panel during diuresis     DVT prophylaxis: Eliquis    Code Status: Full  Family Communication: Son updated at bedside  Consults called: none Admission status: Observation     Vianne Bulls, MD [facility] Pager [phone]  If 7PM-7AM, please contact night-coverage [email or web address] Password [email or web address]  07/31/2018, 7:56 PM

## 2018-07-31 NOTE — ED Notes (Signed)
Report given to [facility]  

## 2018-07-31 NOTE — ED Notes (Signed)
[facility] arrived to transport Pt. 

## 2018-08-01 ENCOUNTER — Other Ambulatory Visit: Payer: Self-pay | Admitting: Physician Assistant

## 2018-08-01 DIAGNOSIS — I480 Paroxysmal atrial fibrillation: Secondary | ICD-10-CM | POA: Diagnosis not present

## 2018-08-01 DIAGNOSIS — K219 Gastro-esophageal reflux disease without esophagitis: Secondary | ICD-10-CM | POA: Diagnosis present

## 2018-08-01 DIAGNOSIS — E1165 Type 2 diabetes mellitus with hyperglycemia: Secondary | ICD-10-CM | POA: Diagnosis not present

## 2018-08-01 DIAGNOSIS — T501X5A Adverse effect of loop [high-ceiling] diuretics, initial encounter: Secondary | ICD-10-CM | POA: Diagnosis not present

## 2018-08-01 DIAGNOSIS — I5033 Acute on chronic diastolic (congestive) heart failure: Secondary | ICD-10-CM

## 2018-08-01 DIAGNOSIS — N183 Chronic kidney disease, stage 3 (moderate): Secondary | ICD-10-CM

## 2018-08-01 DIAGNOSIS — Z79899 Other long term (current) drug therapy: Secondary | ICD-10-CM | POA: Diagnosis not present

## 2018-08-01 DIAGNOSIS — J9621 Acute and chronic respiratory failure with hypoxia: Secondary | ICD-10-CM | POA: Diagnosis not present

## 2018-08-01 DIAGNOSIS — Z9981 Dependence on supplemental oxygen: Secondary | ICD-10-CM | POA: Diagnosis not present

## 2018-08-01 DIAGNOSIS — Z794 Long term (current) use of insulin: Secondary | ICD-10-CM | POA: Diagnosis not present

## 2018-08-01 DIAGNOSIS — E875 Hyperkalemia: Secondary | ICD-10-CM | POA: Diagnosis not present

## 2018-08-01 DIAGNOSIS — J449 Chronic obstructive pulmonary disease, unspecified: Secondary | ICD-10-CM | POA: Diagnosis not present

## 2018-08-01 DIAGNOSIS — J441 Chronic obstructive pulmonary disease with (acute) exacerbation: Principal | ICD-10-CM

## 2018-08-01 DIAGNOSIS — Z888 Allergy status to other drugs, medicaments and biological substances status: Secondary | ICD-10-CM | POA: Diagnosis not present

## 2018-08-01 DIAGNOSIS — E871 Hypo-osmolality and hyponatremia: Secondary | ICD-10-CM | POA: Diagnosis present

## 2018-08-01 DIAGNOSIS — I130 Hypertensive heart and chronic kidney disease with heart failure and stage 1 through stage 4 chronic kidney disease, or unspecified chronic kidney disease: Secondary | ICD-10-CM | POA: Diagnosis present

## 2018-08-01 DIAGNOSIS — N179 Acute kidney failure, unspecified: Secondary | ICD-10-CM | POA: Diagnosis not present

## 2018-08-01 DIAGNOSIS — E11649 Type 2 diabetes mellitus with hypoglycemia without coma: Secondary | ICD-10-CM | POA: Diagnosis not present

## 2018-08-01 DIAGNOSIS — Z7901 Long term (current) use of anticoagulants: Secondary | ICD-10-CM | POA: Diagnosis not present

## 2018-08-01 DIAGNOSIS — E1122 Type 2 diabetes mellitus with diabetic chronic kidney disease: Secondary | ICD-10-CM | POA: Diagnosis present

## 2018-08-01 DIAGNOSIS — Z7952 Long term (current) use of systemic steroids: Secondary | ICD-10-CM | POA: Diagnosis not present

## 2018-08-01 DIAGNOSIS — J8410 Pulmonary fibrosis, unspecified: Secondary | ICD-10-CM | POA: Diagnosis present

## 2018-08-01 DIAGNOSIS — T380X5A Adverse effect of glucocorticoids and synthetic analogues, initial encounter: Secondary | ICD-10-CM | POA: Diagnosis present

## 2018-08-01 LAB — GLUCOSE, CAPILLARY
Glucose-Capillary: 276 mg/dL — ABNORMAL HIGH (ref 70–99)
Glucose-Capillary: 382 mg/dL — ABNORMAL HIGH (ref 70–99)
Glucose-Capillary: 180 mg/dL — ABNORMAL HIGH (ref 70–99)
Glucose-Capillary: 288 mg/dL — ABNORMAL HIGH (ref 70–99)

## 2018-08-01 LAB — SODIUM, URINE, RANDOM: SODIUM UR: 46 mmol/L

## 2018-08-01 MED ORDER — IPRATROPIUM BROMIDE 0.02 % IN SOLN
0.5000 mg | Freq: Four times a day (QID) | RESPIRATORY_TRACT | Status: DC
  Administered 2018-08-01 – 2018-08-02 (×6): 0.5 mg via RESPIRATORY_TRACT
  Filled 2018-08-01 (×6): qty 2.5

## 2018-08-01 NOTE — Progress Notes (Signed)
Inpatient Diabetes Program Recommendations  AACE/ADA: New Consensus Statement on Inpatient Glycemic Control (2015)  Target Ranges:  Prepandial:   less than 140 mg/dL      Peak postprandial:   less than 180 mg/dL (1-2 hours)      Critically ill patients:  140 - 180 mg/dL   Lab Results  Component Value Date   GLUCAP 276 (H) 08/01/2018   HGBA1C 6.9 (H) 06/17/2018    Review of Glycemic ControlResults for CLARION, MOONEYHAN (MRN [id number]) as of 08/01/2018 10:24  Ref. Range 07/31/2018 23:20 08/01/2018 07:37  Glucose-Capillary Latest Ref Range: 70 - 99 mg/dL 255 (H) 276 (H)    Diabetes history: DM 2 Outpatient Diabetes medications:  Basaglar 20 units bid, Humalog 8 units tid with meals Current orders for Inpatient glycemic control:  Lantus 10 units q HS, Novolog moderate tid with meals and HS Inpatient Diabetes Program Recommendations:    Please consider increasing Lantus to 15 units bid. Also consider adding Novolog meal coverage 4 units tid with meals.    Thanks,  Adah Perl, RN, BC-ADM Inpatient Diabetes Coordinator Pager [phone] (8a-5p)

## 2018-08-01 NOTE — Consult Note (Signed)
   [facility] CM Inpatient Consult   08/01/2018  Deonte KANNON GRANDERSON 1935/07/04 [id number]  Patient previously  active with [facility] for chronic disease management services.  Patient has been engaged by a [employer]  Our community based plan of care has focused on disease management and community resource support. [facility] CM staff has had difficulty maintaining contact.   Met with patient at the bedside.  Patient states the only number working is his wife's phone number.  "I can't tell you her number but you can have them call my boy."   Patient will receive a post hospital call and will be evaluated for monthly home visits for assessments and disease process education.  Notified Inpatient Case Manager in progression meeting and made aware that [facility] following. Of note, [facility] Care Management services does not replace or interfere with any services that are needed or arranged by inpatient case management or social work.  For additional questions or referrals please contact:  Natividad Brood, RN BSN [facility] Liaison  [phone] business mobile phone Toll free office [phone]

## 2018-08-01 NOTE — Plan of Care (Signed)
  Problem: Education: Goal: Knowledge of General Education information will improve Description Including pain rating scale, medication(s)/side effects and non-pharmacologic comfort measures Outcome: Progressing   Problem: Health Behavior/Discharge Planning: Goal: Ability to manage health-related needs will improve Outcome: Progressing   Problem: Clinical Measurements: Goal: Ability to maintain clinical measurements within normal limits will improve Outcome: Progressing Goal: Will remain free from infection Outcome: Progressing Goal: Diagnostic test results will improve Outcome: Progressing Goal: Respiratory complications will improve Outcome: Progressing Goal: Cardiovascular complication will be avoided Outcome: Progressing   Problem: Clinical Measurements: Goal: Will remain free from infection Outcome: Progressing   Problem: Clinical Measurements: Goal: Diagnostic test results will improve Outcome: Progressing   Problem: Clinical Measurements: Goal: Respiratory complications will improve Outcome: Progressing   Problem: Clinical Measurements: Goal: Cardiovascular complication will be avoided Outcome: Progressing   Problem: Activity: Goal: Risk for activity intolerance will decrease Outcome: Progressing   Problem: Nutrition: Goal: Adequate nutrition will be maintained Outcome: Progressing   Problem: Coping: Goal: Level of anxiety will decrease Outcome: Progressing   Problem: Elimination: Goal: Will not experience complications related to bowel motility Outcome: Progressing Goal: Will not experience complications related to urinary retention Outcome: Progressing   Problem: Elimination: Goal: Will not experience complications related to urinary retention Outcome: Progressing   Problem: Pain Managment: Goal: General experience of comfort will improve Outcome: Progressing   Problem: Safety: Goal: Ability to remain free from injury will improve Outcome:  Progressing   Problem: Education: Goal: Ability to demonstrate management of disease process will improve Outcome: Progressing Goal: Ability to verbalize understanding of medication therapies will improve Outcome: Progressing Goal: Individualized Educational Video(s) Outcome: Progressing   Problem: Education: Goal: Ability to verbalize understanding of medication therapies will improve Outcome: Progressing   Problem: Activity: Goal: Capacity to carry out activities will improve Outcome: Progressing   Problem: Tissue Perfusion: Goal: Adequacy of tissue perfusion will improve Outcome: Progressing

## 2018-08-01 NOTE — Plan of Care (Signed)
  Problem: Education: Goal: Knowledge of General Education information will improve Description Including pain rating scale, medication(s)/side effects and non-pharmacologic comfort measures Outcome: Progressing   Problem: Clinical Measurements: Goal: Ability to maintain clinical measurements within normal limits will improve Outcome: Progressing   Problem: Clinical Measurements: Goal: Diagnostic test results will improve Outcome: Progressing   Problem: Elimination: Goal: Will not experience complications related to bowel motility Outcome: Progressing   Problem: Pain Managment: Goal: General experience of comfort will improve Outcome: Progressing   Problem: Activity: Goal: Capacity to carry out activities will improve Outcome: Progressing

## 2018-08-01 NOTE — Progress Notes (Signed)
PROGRESS NOTE  Edward [name] OQH:[id number] DOB: July 25, 1935 DOA: 07/31/2018 PCP: Orlena Sheldon, PA-C  Brief Narrative: 83 year old man PMH COPD with chronic hypoxic respiratory failure, pulmonary fibrosis, chronic diastolic CHF, atrial fibrillation on Eliquis, presented with increasing lower extremity edema and shortness of breath.  Recently treated with azithromycin and steroid taper earlier in October.  No chest pain.  Admitted for COPD exacerbation, acute on chronic diastolic CHF.  Assessment/Plan Acute on chronic hypoxic respiratory failure, multifactorial, COPD exacerbation, pulmonary fibrosis, acute on chronic diastolic CHF. --Improving but not back to baseline yet.  Still short of breath.  Treat below.  Acute on chronic diastolic CHF with volume overload --Still has significant bilateral lower extremity edema.  Continue IV diuresis.  COPD exacerbation, superimposed on pulmonary fibrosis, COPD, chronic hypoxic respiratory failure on 3 L nasal cannula at home --Continue bronchodilators, steroids.  Still wheezing, poor air movement.  Atrial fibrillation, CHA2DS2-VASc 7. --Continue apixaban, diltiazem, metoprolol  Hyponatremia secondary to CHF --Check BMP in a.m.  Diabetes mellitus type 2 on insulin.  Hemoglobin A1c 6.9% in June. --Continue Levemir, NovoLog  CKD stage III --Appears stable.  Check BMP in a.m.  DVT prophylaxis: apixaban Code Status: Full Family Communication: wife and son at bedside Disposition Plan: home    Murray Hodgkins, MD  [facility] Direct contact: [phone] --[address] [place]  --[email or web address]; password [email or web address]  7PM-7AM contact night coverage as above 08/01/2018, 4:38 PM  LOS: 0 days   Consultants:    Procedures:    Antimicrobials:    Interval history/Subjective: Feels better but still significantly short of breath especially when moving.  Not back to baseline.  Objective: Vitals:  Vitals:   08/01/18 1410 08/01/18 1547    BP:  (!) 141/67  Pulse:  75  Resp:    Temp:  98.6 F (37 C)  SpO2: 93% 98%    Exam:  Constitutional:  . Appears calm and comfortable Eyes:  . pupils and irises appear normal . Normal lids  ENMT:  . grossly normal hearing  Respiratory:  . Bilateral wheezes, poor air movement, no rhonchi or rales. Marland Kitchen Respiratory effort mildly increased. Cardiovascular:  . RRR, no m/r/g . 2+ bilateral lower extremity edema   . Telemetry sinus rhythm Psychiatric:  . Mental status o Mood, affect appropriate  I have personally reviewed the following:   Data: . Multiple voids, I/O inaccurate . CBG stable . No labs today.  Admission sodium 126, creatinine 1.13, LFTs unremarkable.  BNP and troponin were unremarkable.  Hemoglobin was stable on admission at 10.4. Marland Kitchen Chest x-ray no acute abnormalities . EKG sinus rhythm, no acute changes  Scheduled Meds: . apixaban  2.5 mg Oral BID  . aspirin EC  81 mg Oral Daily  . budesonide  0.5 mg Nebulization BID  . cloNIDine  0.1 mg Oral BID  . diltiazem  240 mg Oral Daily  . furosemide  40 mg Intravenous Q12H  . insulin aspart  0-15 Units Subcutaneous TID WC  . insulin aspart  0-5 Units Subcutaneous QHS  . insulin glargine  10 Units Subcutaneous QHS  . ipratropium  0.5 mg Nebulization Q6H  . losartan  100 mg Oral Daily  . methylPREDNISolone (SOLU-MEDROL) injection  60 mg Intravenous Q6H  . metoprolol tartrate  25 mg Oral BID  . pantoprazole  40 mg Oral BID AC  . polyethylene glycol  17 g Oral Daily  . pravastatin  80 mg Oral q1800  . sodium chloride flush  3 mL Intravenous Q12H  Continuous Infusions: . sodium chloride    . doxycycline (VIBRAMYCIN) IV 100 mg (08/01/18 1114)    Principal Problem:   Acute on chronic respiratory failure with hypoxemia ([name]) Active Problems:   Diabetes mellitus type 2, uncontrolled ([name])   AF (paroxysmal atrial fibrillation) ([name])   CKD (chronic kidney disease), stage III ([name])   Acute on chronic diastolic  CHF (congestive heart failure) ([place])   COPD with acute exacerbation ([place])   Hyponatremia   LOS: 0 days

## 2018-08-02 DIAGNOSIS — I480 Paroxysmal atrial fibrillation: Secondary | ICD-10-CM

## 2018-08-02 DIAGNOSIS — N179 Acute kidney failure, unspecified: Secondary | ICD-10-CM

## 2018-08-02 LAB — BASIC METABOLIC PANEL
Anion gap: 7 (ref 5–15)
BUN: 40 mg/dL — AB (ref 8–23)
CO2: 31 mmol/L (ref 22–32)
CREATININE: 1.66 mg/dL — AB (ref 0.61–1.24)
Calcium: 8.5 mg/dL — ABNORMAL LOW (ref 8.9–10.3)
Chloride: 91 mmol/L — ABNORMAL LOW (ref 98–111)
GFR calc Af Amer: 43 mL/min — ABNORMAL LOW (ref >60)
GFR calc non Af Amer: 37 mL/min — ABNORMAL LOW (ref >60)
Glucose, Bld: 361 mg/dL — ABNORMAL HIGH (ref 70–99)
Potassium: 5.4 mmol/L — ABNORMAL HIGH (ref 3.5–5.1)
SODIUM: 129 mmol/L — AB (ref 135–145)

## 2018-08-02 LAB — GLUCOSE, CAPILLARY
Glucose-Capillary: 357 mg/dL — ABNORMAL HIGH (ref 70–99)
Glucose-Capillary: 443 mg/dL — ABNORMAL HIGH (ref 70–99)
Glucose-Capillary: 345 mg/dL — ABNORMAL HIGH (ref 70–99)
Glucose-Capillary: 290 mg/dL — ABNORMAL HIGH (ref 70–99)

## 2018-08-02 LAB — GLUCOSE, RANDOM: Glucose, Bld: 432 mg/dL — ABNORMAL HIGH (ref 70–99)

## 2018-08-02 MED ORDER — INSULIN ASPART 100 UNIT/ML ~~LOC~~ SOLN
4.0000 [IU] | Freq: Three times a day (TID) | SUBCUTANEOUS | Status: DC
  Administered 2018-08-02 – 2018-08-05 (×9): 4 [IU] via SUBCUTANEOUS

## 2018-08-02 MED ORDER — INSULIN GLARGINE 100 UNIT/ML ~~LOC~~ SOLN
15.0000 [IU] | Freq: Every day | SUBCUTANEOUS | Status: DC
  Administered 2018-08-02 – 2018-08-04 (×3): 15 [IU] via SUBCUTANEOUS
  Filled 2018-08-02 (×3): qty 0.15

## 2018-08-02 MED ORDER — PREDNISONE 20 MG PO TABS
40.0000 mg | ORAL_TABLET | Freq: Every day | ORAL | Status: DC
  Administered 2018-08-03 – 2018-08-04 (×2): 40 mg via ORAL
  Filled 2018-08-02 (×2): qty 2

## 2018-08-02 MED ORDER — DOXYCYCLINE HYCLATE 100 MG PO TABS
100.0000 mg | ORAL_TABLET | Freq: Two times a day (BID) | ORAL | Status: DC
  Administered 2018-08-02 – 2018-08-05 (×7): 100 mg via ORAL
  Filled 2018-08-02 (×7): qty 1

## 2018-08-02 MED ORDER — IPRATROPIUM BROMIDE 0.02 % IN SOLN
0.5000 mg | Freq: Three times a day (TID) | RESPIRATORY_TRACT | Status: DC
  Administered 2018-08-02 – 2018-08-04 (×6): 0.5 mg via RESPIRATORY_TRACT
  Filled 2018-08-02 (×7): qty 2.5

## 2018-08-02 MED ORDER — INSULIN ASPART 100 UNIT/ML ~~LOC~~ SOLN
18.0000 [IU] | Freq: Once | SUBCUTANEOUS | Status: AC
  Administered 2018-08-02: 18 [IU] via SUBCUTANEOUS

## 2018-08-02 MED ORDER — METHYLPREDNISOLONE SODIUM SUCC 40 MG IJ SOLR: 40.0000 mg | Freq: Two times a day (BID) | INTRAMUSCULAR | Status: DC

## 2018-08-02 NOTE — Progress Notes (Signed)
Patient stated : " I can't breath."  Expiratory wheezing and tightness upon assessment. Albuterol nebulizer 2.5 mg prn given. MD paged.

## 2018-08-02 NOTE — Progress Notes (Signed)
In and out cath per MD order completed. 750 ml in and out cath output.

## 2018-08-02 NOTE — Progress Notes (Signed)
PROGRESS NOTE  Edward [name] [id number]:[id number] DOB: June 25, 1935 DOA: 07/31/2018 PCP: Orlena Sheldon, PA-C  Brief Narrative: 83 year old man PMH COPD with chronic hypoxic respiratory failure, pulmonary fibrosis, chronic diastolic CHF, atrial fibrillation on Eliquis, presented with increasing lower extremity edema and shortness of breath.  Recently treated with azithromycin and steroid taper earlier in October.  No chest pain.  Admitted for COPD exacerbation, acute on chronic diastolic CHF.  Assessment/Plan Acute on chronic hypoxic respiratory failure, multifactorial, COPD exacerbation, pulmonary fibrosis, acute on chronic diastolic CHF. --Not back to baseline, still poor air movement..  Acute on chronic diastolic CHF with volume overload --Acute component appears resolved.  Lower extremity edema essentially resolved.  Hold Lasix today.  Acute kidney injury with modest hyperkalemia, superimposed on CKD stage III --secondary to furosemide.  As the patient appears euvolemic, will discontinue furosemide and losartan and recheck BMP in a.m.  COPD exacerbation, superimposed on pulmonary fibrosis, COPD, chronic hypoxic respiratory failure on 3 L nasal cannula at home --Still not back to baseline, continue bronchodilators.  Given hyperglycemia will decrease steroids.  Atrial fibrillation, CHA2DS2-VASc 7. --Stable.  Continue apixaban, diltiazem, metoprolol  Hyponatremia secondary to CHF --Check BMP in a.m.  Diabetes mellitus type 2 on insulin.  Hemoglobin A1c 6.9% in June. --Hyperglycemic on steroids.  Continue Levemir and NovoLog.  Add meal coverage.  Expect will improve with decrease steroids.   Stop furosemide, hold Losartan, decrease steroid dose and frequency, add Novolog meal coverage.  Check BMP in a.m.  If renal function stabilized and breathing is improved, may be able to go home.  DVT prophylaxis: apixaban Code Status: Full Family Communication: none present Disposition Plan: home     Murray Hodgkins, MD  [facility] Direct contact: [phone] --Via amion app OR  --[email or web address]; password [email or web address]  7PM-7AM contact night coverage as above 08/02/2018, 8:11 AM  LOS: 1 day   Consultants:    Procedures:    Antimicrobials:    Interval history/Subjective: Feels better today but still short of breath, not back to baseline.  Objective: Vitals:  Vitals:   08/02/18 0449 08/02/18 0759  BP: (!) 156/70   Pulse: 91   Resp: 18   Temp: 98.2 F (36.8 C)   SpO2: 99% 94%    Exam: Constitutional:   . Appears calm and comfortable Respiratory:  Marland Kitchen Very poor air movement.  No frank wheezes rales or rhonchi. . Mild increased respiratory effort. Cardiovascular:  . RRR, no m/r/g . Bilateral lower extremity edema appears resolved Psychiatric:  . Mental status o Mood, affect appropriate  I have personally reviewed the following:   Data: . Potassium 5.4, sodium 129, BUN now 40, creatinine up, 1.66.  Hyperglycemic.  Scheduled Meds: . apixaban  2.5 mg Oral BID  . aspirin EC  81 mg Oral Daily  . budesonide  0.5 mg Nebulization BID  . cloNIDine  0.1 mg Oral BID  . diltiazem  240 mg Oral Daily  . furosemide  40 mg Intravenous Q12H  . insulin aspart  0-15 Units Subcutaneous TID WC  . insulin aspart  0-5 Units Subcutaneous QHS  . insulin glargine  10 Units Subcutaneous QHS  . ipratropium  0.5 mg Nebulization Q6H  . losartan  100 mg Oral Daily  . methylPREDNISolone (SOLU-MEDROL) injection  60 mg Intravenous Q6H  . metoprolol tartrate  25 mg Oral BID  . pantoprazole  40 mg Oral BID AC  . polyethylene glycol  17 g Oral Daily  . pravastatin  80 mg Oral q1800  .  sodium chloride flush  3 mL Intravenous Q12H   Continuous Infusions: . sodium chloride    . doxycycline (VIBRAMYCIN) IV 100 mg (08/01/18 2044)    Principal Problem:   Acute on chronic respiratory failure with hypoxemia ([name]) Active Problems:   Diabetes mellitus type 2, uncontrolled ([name])    AF (paroxysmal atrial fibrillation) ([name])   CKD (chronic kidney disease), stage III ([name])   Acute on chronic diastolic CHF (congestive heart failure) ([place])   COPD with acute exacerbation ([place])   Hyponatremia   LOS: 1 day

## 2018-08-02 NOTE — Progress Notes (Signed)
Results for KANEN, MOTTOLA (MRN [id number]) as of 08/02/2018 07:27  Ref. Range 07/31/2018 23:20 08/01/2018 07:37 08/01/2018 11:50 08/01/2018 16:30 08/01/2018 21:11  Glucose-Capillary Latest Ref Range: 70 - 99 mg/dL 255 (H) 276 (H) 382 (H) 180 (H) 288 (H)  Noted that blood sugars continue to be greater than 200 mg/dl. Lab glucose this am (11/1) was 361 mg/dl.   Recommend increasing Lantus to 15 units BID and continuing Novolog correction scale as ordered.  With patient being on steroids, recommend adding Novolog 4 units TID with meals if patient eats at least 50% of meal. Postprandial CBGs tend to be elevated with steroids.  Harvel Ricks RN BSN CDE Diabetes Coordinator Pager: [phone]  8am-5pm

## 2018-08-03 DIAGNOSIS — E871 Hypo-osmolality and hyponatremia: Secondary | ICD-10-CM

## 2018-08-03 LAB — BASIC METABOLIC PANEL
ANION GAP: 7 (ref 5–15)
BUN: 50 mg/dL — AB (ref 8–23)
CO2: 31 mmol/L (ref 22–32)
Calcium: 8.6 mg/dL — ABNORMAL LOW (ref 8.9–10.3)
Chloride: 93 mmol/L — ABNORMAL LOW (ref 98–111)
Creatinine, Ser: 1.7 mg/dL — ABNORMAL HIGH (ref 0.61–1.24)
GFR calc Af Amer: 42 mL/min — ABNORMAL LOW (ref >60)
GFR calc non Af Amer: 36 mL/min — ABNORMAL LOW (ref >60)
Glucose, Bld: 181 mg/dL — ABNORMAL HIGH (ref 70–99)
POTASSIUM: 4.8 mmol/L (ref 3.5–5.1)
SODIUM: 131 mmol/L — AB (ref 135–145)

## 2018-08-03 LAB — GLUCOSE, CAPILLARY
GLUCOSE-CAPILLARY: 177 mg/dL — AB (ref 70–99)
GLUCOSE-CAPILLARY: 260 mg/dL — AB (ref 70–99)
Glucose-Capillary: 263 mg/dL — ABNORMAL HIGH (ref 70–99)
Glucose-Capillary: 258 mg/dL — ABNORMAL HIGH (ref 70–99)

## 2018-08-03 NOTE — Progress Notes (Signed)
Pt had 2 episodes of urine mixed with BM and bladder scan with 101 ML

## 2018-08-03 NOTE — Progress Notes (Signed)
PROGRESS NOTE  Edward [name] ASN:[id number] DOB: 10/26/1934 DOA: 07/31/2018 PCP: Orlena Sheldon, PA-C  Brief Narrative: 83 year old man PMH COPD with chronic hypoxic respiratory failure, pulmonary fibrosis, chronic diastolic CHF, atrial fibrillation on Eliquis, presented with increasing lower extremity edema and shortness of breath.  Recently treated with azithromycin and steroid taper earlier in October.  No chest pain.  Admitted for COPD exacerbation, acute on chronic diastolic CHF.  Assessment/Plan Acute on chronic hypoxic respiratory failure, multifactorial, COPD exacerbation, pulmonary fibrosis, acute on chronic diastolic CHF. --Appears to be improving although not back to baseline still wheezing.  Acute on chronic diastolic CHF with volume overload --Acute component appears resolved, Lasix on hold.  Acute kidney injury with modest hyperkalemia, superimposed on CKD stage III --Hyperkalemia resolved, likely secondary to diuresis and urinary retention. --Appears to be peaking.  Follow urine output today and whether patient will need additional straight cath.  BMP in a.m.  COPD exacerbation, superimposed on pulmonary fibrosis, COPD, chronic hypoxic respiratory failure on 3 L nasal cannula at home --Not back to baseline yet but improving, continue steroids at lower dose, bronchodilators  Atrial fibrillation, CHA2DS2-VASc 7. --Remains in sinus rhythm.  Continue apixaban, diltiazem, metoprolol  Hyponatremia secondary to CHF --Resolving.  BMP in a.m.  Diabetes mellitus type 2 on insulin.  Hemoglobin A1c 6.9% in June. --Hyperglycemic on steroids, she is improved with decreased dose.  Continue Levemir and NovoLog.    Check BMP in a.m., continue treatment for COPD, if renal function stabilizes, anticipate discharge 11/3.    DVT prophylaxis: apixaban Code Status: Full Family Communication: none present Disposition Plan: home    Murray Hodgkins, MD  [facility] Direct  contact: [phone] --Via amion app OR  --[email or web address]; password [email or web address]  7PM-7AM contact night coverage as above 08/03/2018, 12:06 PM  LOS: 2 days   Consultants:    Procedures:    Antimicrobials:    Interval history/Subjective: Had a period of shortness of breath yesterday and difficulty urinating, requiring in and out cath.  Feels better today from breathing perspective.  Objective: Vitals:  Vitals:   08/03/18 0726 08/03/18 0912  BP:  (!) 162/71  Pulse:  88  Resp:    Temp:    SpO2: 98%     Exam: Constitutional:   . Appears calm, mildly short of breath ENMT:  . grossly normal hearing  Respiratory:  . Bilateral wheezes, fair air movement. Marland Kitchen Respiratory effort mildly increased. Cardiovascular:  . RRR, no m/r/g . Trace bilateral LE extremity edema   . Telemetry sinus rhythm Psychiatric:  . Mental status o Mood, affect appropriate   I have personally reviewed the following:   Data: . Potassium 4.8, BUN higher 50, creatinine 1.7 . CBG 177-443  Scheduled Meds: . apixaban  2.5 mg Oral BID  . aspirin EC  81 mg Oral Daily  . budesonide  0.5 mg Nebulization BID  . cloNIDine  0.1 mg Oral BID  . diltiazem  240 mg Oral Daily  . doxycycline  100 mg Oral Q12H  . insulin aspart  0-15 Units Subcutaneous TID WC  . insulin aspart  0-5 Units Subcutaneous QHS  . insulin aspart  4 Units Subcutaneous TID WC  . insulin glargine  15 Units Subcutaneous QHS  . ipratropium  0.5 mg Nebulization TID  . metoprolol tartrate  25 mg Oral BID  . pantoprazole  40 mg Oral BID AC  . polyethylene glycol  17 g Oral Daily  . pravastatin  80 mg Oral q1800  . predniSONE  40 mg Oral Q breakfast  . sodium chloride flush  3 mL Intravenous Q12H   Continuous Infusions: . sodium chloride      Principal Problem:   Acute on chronic respiratory failure with hypoxemia ([name]) Active Problems:   Diabetes mellitus type 2, uncontrolled ([name])   AF (paroxysmal atrial fibrillation) ([name])   CKD  (chronic kidney disease), stage III ([name])   Acute on chronic diastolic CHF (congestive heart failure) ([place])   COPD with acute exacerbation ([name])   Hyponatremia   LOS: 2 days

## 2018-08-03 NOTE — Plan of Care (Signed)
  Problem: Coping: Goal: Ability to adjust to condition or change in health will improve Outcome: Progressing   Problem: Fluid Volume: Goal: Ability to maintain a balanced intake and output will improve Outcome: Progressing   Problem: Nutritional: Goal: Maintenance of adequate nutrition will improve Outcome: Progressing   Problem: Tissue Perfusion: Goal: Adequacy of tissue perfusion will improve Outcome: Progressing

## 2018-08-03 NOTE — Plan of Care (Signed)
  Problem: Coping: Goal: Ability to adjust to condition or change in health will improve Outcome: Progressing   Problem: Tissue Perfusion: Goal: Adequacy of tissue perfusion will improve Outcome: Progressing   

## 2018-08-04 ENCOUNTER — Inpatient Hospital Stay (HOSPITAL_COMMUNITY): Payer: Medicare Other

## 2018-08-04 LAB — BASIC METABOLIC PANEL
Anion gap: 5 (ref 5–15)
BUN: 60 mg/dL — AB (ref 8–23)
CALCIUM: 8.3 mg/dL — AB (ref 8.9–10.3)
CHLORIDE: 94 mmol/L — AB (ref 98–111)
CO2: 32 mmol/L (ref 22–32)
CREATININE: 1.71 mg/dL — AB (ref 0.61–1.24)
GFR calc non Af Amer: 36 mL/min — ABNORMAL LOW (ref >60)
GFR, EST AFRICAN AMERICAN: 41 mL/min — AB (ref >60)
Glucose, Bld: 136 mg/dL — ABNORMAL HIGH (ref 70–99)
Potassium: 4.6 mmol/L (ref 3.5–5.1)
SODIUM: 131 mmol/L — AB (ref 135–145)

## 2018-08-04 LAB — URINALYSIS, ROUTINE W REFLEX MICROSCOPIC
BACTERIA UA: NONE SEEN
Bilirubin Urine: NEGATIVE
GLUCOSE, UA: 50 mg/dL — AB
KETONES UR: NEGATIVE mg/dL
NITRITE: NEGATIVE
Specific Gravity, Urine: 1.013 (ref 1.005–1.030)
pH: 5 (ref 5.0–8.0)

## 2018-08-04 LAB — GLUCOSE, CAPILLARY
GLUCOSE-CAPILLARY: 133 mg/dL — AB (ref 70–99)
GLUCOSE-CAPILLARY: 211 mg/dL — AB (ref 70–99)
Glucose-Capillary: 260 mg/dL — ABNORMAL HIGH (ref 70–99)
Glucose-Capillary: 303 mg/dL — ABNORMAL HIGH (ref 70–99)

## 2018-08-04 MED ORDER — SODIUM CHLORIDE 0.9 % IV SOLN
INTRAVENOUS | Status: AC
  Administered 2018-08-04: 15:00:00 via INTRAVENOUS

## 2018-08-04 MED ORDER — PREDNISONE 20 MG PO TABS
20.0000 mg | ORAL_TABLET | Freq: Every day | ORAL | Status: DC
  Administered 2018-08-05: 20 mg via ORAL
  Filled 2018-08-04: qty 1

## 2018-08-04 NOTE — Progress Notes (Signed)
PROGRESS NOTE  Edward HILLOCK [id number]:[id number] DOB: 1934-11-14 DOA: 07/31/2018 PCP: Orlena Sheldon, PA-C  Brief Narrative: 83 year old man PMH COPD with chronic hypoxic respiratory failure, pulmonary fibrosis, chronic diastolic CHF, atrial fibrillation on Eliquis, presented with increasing lower extremity edema and shortness of breath.  Recently treated with azithromycin and steroid taper earlier in October.  No chest pain.  Admitted for COPD exacerbation, acute on chronic diastolic CHF.  Assessment/Plan Acute on chronic hypoxic respiratory failure, multifactorial, COPD exacerbation, pulmonary fibrosis, acute on chronic diastolic CHF. --Appears stable at this point.  Acute on chronic diastolic CHF with volume overload --Some mild lower extremity edema but asymptomatic.  Acute kidney injury with modest hyperkalemia, superimposed on CKD stage III --Hyperkalemia resolved, however renal function without significant change.  I suspect increasing BUN is related to steroids rather than renal failure.  Elevated creatinine likely secondary to overdiuresis and urinary retention intermittent.  Renal ultrasound unremarkable.  Urinalysis equivocal, asymptomatic --Gentle IV fluids, recheck BMP in a.m. if creatinine stable, anticipate discharge  COPD exacerbation, superimposed on pulmonary fibrosis, COPD, chronic hypoxic respiratory failure on 3 L nasal cannula at home --Appears to be close to baseline, wean steroids  Atrial fibrillation, CHA2DS2-VASc 7. --Stable.  Continue apixaban, diltiazem, metoprolol  Hyponatremia secondary to CHF --Mild, stable.  Follow-up as an outpatient.  Diabetes mellitus type 2 on insulin.  Hemoglobin A1c 6.9% in June. --Hyperglycemia overall somewhat improved.  Continue Levemir and NovoLog.  Should improve as steroids are weaned.   Wean steroids.  Repeat BMP in a.m.  Gentle IV fluids.  If creatinine stable in the morning, anticipate discharge.  DVT prophylaxis:  apixaban Code Status: Full Family Communication: none present Disposition Plan: home    Murray Hodgkins, MD  [facility] Direct contact: [phone] --Via amion app OR  --[email or web address]; password [email or web address]  7PM-7AM contact night coverage as above 08/04/2018, 1:29 PM  LOS: 3 days   Consultants:    Procedures:    Antimicrobials:    Interval history/Subjective: Breathing fine.  Objective: Vitals:  Vitals:   08/04/18 0325 08/04/18 1307  BP: (!) 153/79 (!) 142/84  Pulse: 75 66  Resp: 18 18  Temp: (!) 97 F (36.1 C) 97.8 F (36.6 C)  SpO2: 98% 100%    Exam: Constitutional:   . Appears calm and comfortable Respiratory:  . Fair air movement . Respiratory effort normal.  Cardiovascular:  . RRR, no m/r/g . 1+ bilateral LE extremity edema   Psychiatric:  . Mental status o Mood, affect appropriate  I have personally reviewed the following:   Data: . Potassium 4.6, sodium stable 131, BUN trending up 60, creatinine appears to be stabilizing 1.7 . Multiple voids.  Scheduled Meds: . apixaban  2.5 mg Oral BID  . aspirin EC  81 mg Oral Daily  . budesonide  0.5 mg Nebulization BID  . cloNIDine  0.1 mg Oral BID  . diltiazem  240 mg Oral Daily  . doxycycline  100 mg Oral Q12H  . insulin aspart  0-15 Units Subcutaneous TID WC  . insulin aspart  0-5 Units Subcutaneous QHS  . insulin aspart  4 Units Subcutaneous TID WC  . insulin glargine  15 Units Subcutaneous QHS  . ipratropium  0.5 mg Nebulization TID  . metoprolol tartrate  25 mg Oral BID  . pantoprazole  40 mg Oral BID AC  . polyethylene glycol  17 g Oral Daily  . pravastatin  80 mg Oral q1800  . predniSONE  40 mg Oral Q breakfast  .  sodium chloride flush  3 mL Intravenous Q12H   Continuous Infusions: . sodium chloride      Principal Problem:   Acute on chronic respiratory failure with hypoxemia ([name]) Active Problems:   Diabetes mellitus type 2, uncontrolled ([name])   AF (paroxysmal atrial  fibrillation) ([name])   CKD (chronic kidney disease), stage III ([name])   Acute on chronic diastolic CHF (congestive heart failure) ([place])   COPD with acute exacerbation ([name])   Hyponatremia   LOS: 3 days

## 2018-08-05 DIAGNOSIS — J9611 Chronic respiratory failure with hypoxia: Secondary | ICD-10-CM

## 2018-08-05 LAB — BASIC METABOLIC PANEL
Anion gap: 4 — ABNORMAL LOW (ref 5–15)
BUN: 58 mg/dL — AB (ref 8–23)
CO2: 34 mmol/L — ABNORMAL HIGH (ref 22–32)
CREATININE: 1.61 mg/dL — AB (ref 0.61–1.24)
Calcium: 8.5 mg/dL — ABNORMAL LOW (ref 8.9–10.3)
Chloride: 96 mmol/L — ABNORMAL LOW (ref 98–111)
GFR calc Af Amer: 45 mL/min — ABNORMAL LOW (ref >60)
GFR calc non Af Amer: 38 mL/min — ABNORMAL LOW (ref >60)
GLUCOSE: 96 mg/dL (ref 70–99)
Potassium: 4.2 mmol/L (ref 3.5–5.1)
SODIUM: 134 mmol/L — AB (ref 135–145)

## 2018-08-05 LAB — GLUCOSE, CAPILLARY
GLUCOSE-CAPILLARY: 62 mg/dL — AB (ref 70–99)
GLUCOSE-CAPILLARY: 88 mg/dL (ref 70–99)

## 2018-08-05 MED ORDER — IPRATROPIUM-ALBUTEROL 0.5-2.5 (3) MG/3ML IN SOLN
3.0000 mL | Freq: Three times a day (TID) | RESPIRATORY_TRACT | Status: DC
  Administered 2018-08-05: 3 mL via RESPIRATORY_TRACT
  Filled 2018-08-05: qty 3

## 2018-08-05 MED ORDER — DOXYCYCLINE HYCLATE 100 MG PO TABS: 100.0000 mg | ORAL_TABLET | Freq: Two times a day (BID) | ORAL | 0 refills | Status: DC

## 2018-08-05 MED ORDER — FUROSEMIDE 40 MG PO TABS: 40.0000 mg | ORAL_TABLET | ORAL | Status: DC

## 2018-08-05 MED ORDER — PREDNISONE 10 MG PO TABS: ORAL_TABLET | ORAL | 0 refills | Status: AC

## 2018-08-05 MED ORDER — HYDROCORTISONE ACETATE 25 MG RE SUPP: 25.0000 mg | Freq: Two times a day (BID) | RECTAL | Status: DC | PRN

## 2018-08-05 NOTE — Progress Notes (Signed)
Inpatient Diabetes Program Recommendations  AACE/ADA: New Consensus Statement on Inpatient Glycemic Control (2019)  Target Ranges:  Prepandial:   less than 140 mg/dL      Peak postprandial:   less than 180 mg/dL (1-2 hours)      Critically ill patients:  140 - 180 mg/dL   Results for KENTLEY, BLYDEN (MRN [id number]) as of 08/05/2018 08:17  Ref. Range 08/04/2018 07:35 08/04/2018 13:04 08/04/2018 16:14 08/04/2018 21:22 08/05/2018 07:45 08/05/2018 08:07  Glucose-Capillary Latest Ref Range: 70 - 99 mg/dL 133 (H) 211 (H) 260 (H) 303 (H) 62 (L) 88   Review of Glycemic Control  Current orders for Inpatient glycemic control: Lantus 15 units QHS, Novolog 4 units TID with meals, Novolog 0-15 units TID with meals, Novolog 0-5 units QHS; Prednisone 20 mg QAM  Inpatient Diabetes Program Recommendations:  Insulin - Basal: Please consider decreasing Lantus to 12 units QHS. Insulin - Meal Coverage: Please consider increasing meal coverage to Novolog 6 units TID with meals.  Thanks, Barnie Alderman, RN, MSN, CDE Diabetes Coordinator Inpatient Diabetes Program [phone] (Team Pager from 8am to 5pm)

## 2018-08-05 NOTE — Discharge Summary (Signed)
Physician Discharge Summary  Edward [name] BMW:[id number] DOB: June 11, 1935 DOA: 07/31/2018  PCP: Orlena Sheldon, PA-C  Admit date: 07/31/2018 Discharge date: 08/05/2018  Recommendations for Outpatient Follow-up:   Acute kidney injury with modest hyperkalemia, superimposed on CKD stage III --Renal function improved today.  Elevated BUN was likely multifactorial including steroids and temporary urinary retention.  Creatinine improving, expect return to baseline spontaneously.  Lasix will be held for an additional 48 hours.  Renal ultrasound unremarkable.  Urinalysis equivocal, asymptomatic  COPD exacerbation, superimposed on pulmonary fibrosis, COPD, chronic hypoxic respiratory failure on 3 L nasal cannula at home --Appears to be at baseline.  Steroid taper as an outpatient.   Follow-up Information    Orlena Sheldon, PA-C. Go on 08/09/2018.   Specialty:  Physician Assistant Why:  @2 :45pm Contact information: [place] [address] [phone]          Discharge Diagnoses:  1. Acute on chronic hypoxic respiratory failure, multifactorial, COPD exacerbation, pulmonary fibrosis, acute on chronic diastolic CHF. 2. Acute on chronic diastolic CHF with volume overload 3. Acute kidney injury with modest hyperkalemia, superimposed on CKD stage III 4. COPD exacerbation, superimposed on pulmonary fibrosis, COPD, chronic hypoxic respiratory failure on 3 L nasal cannula at home 5. Atrial fibrillation, CHA2DS2-VASc 7. 6. Hyponatremia secondary to CHF 7. Diabetes mellitus type 2 on insulin  Discharge Condition: improved Disposition: home  Diet recommendation: heart healthy, diabetic diet  Filed Weights   08/03/18 0300 08/04/18 0325 08/05/18 0503  Weight: 89 kg 89 kg 89.5 kg    History of present illness:  83 year old man PMH COPD with chronic hypoxic respiratory failure, pulmonary fibrosis, chronic diastolic CHF, atrial fibrillation on Eliquis, presented with  increasing lower extremity edema and shortness of breath.  Recently treated with azithromycin and steroid taper earlier in October.  No chest pain.  Admitted for COPD exacerbation, acute on chronic diastolic CHF.  Hospital Course:  Patient was treated with IV diuresis and medication directed at COPD exacerbation with gradual clinical improvement.  On discharge COPD exacerbation appears resolved and he will complete a few more days of doxycycline and prednisone.  CHF appears to be compensated at this point with mild lower extremity edema which he reports is chronic.  Hospitalization was complicated by acute kidney injury secondary to diuresis and urinary retention which is resolved.  Renal function is improving on discharge and patient is voiding.  He will hold losartan until follow-up with his PCP.  He will resume Lasix in 48 hours.  Acute on chronic hypoxic respiratory failure, multifactorial, COPD exacerbation, pulmonary fibrosis, acute on chronic diastolic CHF. --Appears to be at baseline.  Acute on chronic diastolic CHF with volume overload --Appears compensated.  Some mild lower extremity edema but asymptomatic.  Acute kidney injury with modest hyperkalemia, superimposed on CKD stage III --Renal function improved today.  Elevated BUN was likely multifactorial including steroids and temporary urinary retention.  Creatinine improving, expect return to baseline spontaneously.  Lasix will be held for an additional 48 hours.  Renal ultrasound unremarkable.  Urinalysis equivocal, asymptomatic  COPD exacerbation, superimposed on pulmonary fibrosis, COPD, chronic hypoxic respiratory failure on 3 L nasal cannula at home --Appears to be at baseline.  Steroid taper as an outpatient.  Atrial fibrillation, CHA2DS2-VASc 7. --Remained stable.   Continue apixaban, diltiazem, metoprolol  Hyponatremia secondary to CHF --Mild, improved.  Follow-up as an outpatient.  Diabetes mellitus type 2 on  insulin.  Hemoglobin A1c 6.9% in June. --Hyperglycemia overall  stable.    Resume Lantus and Humalog on discharge.   Consultants:  none   Procedures:  none   Antimicrobials:  Doxycycline 10/30 > 11/6  Today's assessment: S: feels better, breathing better, no complaints. O: Vitals:  Vitals:   08/05/18 0830 08/05/18 0859  BP:  (!) 160/68  Pulse:  81  Resp:    Temp:  98 F (36.7 C)  SpO2: 94% 98%    Constitutional:  . Appears calm and comfortable Respiratory:  . CTA bilaterally, no w/r/r. Fair air movement . Respiratory effort normal.  Cardiovascular:  . RRR, no m/r/g . 1+ BLE extremity edema   Psychiatric:  . Mental status o Mood, affect appropriate  One episode of hypoglycemia, otherwise hyperglycemic. BUN improved, 58, creatinine improved 1.61.  Potassium within normal limits.  Sodium improved 134.  Discharge Instructions  Discharge Instructions    (HEART FAILURE PATIENTS) Call MD:  Anytime you have any of the following symptoms: 1) 3 pound weight gain in 24 hours or 5 pounds in 1 week 2) shortness of breath, with or without a dry hacking cough 3) swelling in the hands, feet or stomach 4) if you have to sleep on extra pillows at night in order to breathe.   Complete by:  As directed    Diet - low sodium heart healthy   Complete by:  As directed    Diet Carb Modified   Complete by:  As directed    Discharge instructions   Complete by:  As directed    Call your physician or seek immediate medical attention for pain, swelling, fever, shortness of breath or worsening of condition.   Increase activity slowly   Complete by:  As directed      Allergies as of 08/05/2018      Reactions   Ace Inhibitors Other (See Comments)   Hyperkalemia--07/23/2013:patient states not familiar with the following allergy      Medication List    STOP taking these medications   LEVEMIR 100 UNIT/ML injection Generic drug:  insulin detemir   losartan 100 MG  tablet Commonly known as:  COZAAR     TAKE these medications   albuterol (2.5 MG/3ML) 0.083% nebulizer solution Commonly known as:  PROVENTIL INHALE 1 VIAL VIA NEBULIZER EVERY 6 HOURS AS NEEDED FOR WHEEZING OR SHORTNESS OF BREATH What changed:  See the new instructions.   apixaban 2.5 MG Tabs tablet Commonly known as:  ELIQUIS Take 1 tablet (2.5 mg total) by mouth 2 (two) times daily. Restart on 7/24   aspirin EC 81 MG tablet Take 1 tablet (81 mg total) by mouth daily. Resume on 7/24   BASAGLAR KWIKPEN 100 UNIT/ML Sopn Inject 20 Units into the skin 2 (two) times daily.   budesonide 0.5 MG/2ML nebulizer solution Commonly known as:  PULMICORT Take 2 mLs (0.5 mg total) by nebulization 2 (two) times daily.   cloNIDine 0.1 MG tablet Commonly known as:  CATAPRES TAKE 1 TABLET BY MOUTH 2 TIMES A DAY What changed:  when to take this   diltiazem 240 MG 24 hr capsule Commonly known as:  CARDIZEM CD TAKE 1 CAPSULE BY MOUTH DAILY   doxycycline 100 MG tablet Commonly known as:  VIBRA-TABS Take 1 tablet (100 mg total) by mouth every 12 (twelve) hours.   furosemide 40 MG tablet Commonly known as:  LASIX Take 1 tablet (40 mg total) by mouth every other day. Takes with Potassium 20MEQ. First dose of Lasix 11/6. What changed:    when  to take this  additional instructions   hydrocortisone 25 MG suppository Commonly known as:  ANUSOL-HC Place 1 suppository (25 mg total) rectally 2 (two) times daily as needed for hemorrhoids or anal itching.   insulin lispro 100 UNIT/ML KwikPen Junior Inject 0.08 mLs (8 Units total) into the skin 3 (three) times daily.   ipratropium 0.02 % nebulizer solution Commonly known as:  ATROVENT INHALE THE CONTENTS OF 1 VIAL VIA NEBULIZATION 4 TIMES DAILY What changed:  See the new instructions.   metoprolol tartrate 25 MG tablet Commonly known as:  LOPRESSOR TAKE 1 TABLET BY MOUTH TWICE DAILY   ONE TOUCH ULTRA TEST test strip Generic drug:  glucose  blood   ONETOUCH DELICA LANCETS 47Q Misc USE TO CHECK BLOOD SUGAR TWICE DAILY AS DIRECTED   OXYGEN Inhale 3 L into the lungs continuous.   pantoprazole 40 MG tablet Commonly known as:  PROTONIX Take 1 tablet (40 mg total) by mouth 2 (two) times daily before a meal.   polyethylene glycol packet Commonly known as:  MIRALAX / GLYCOLAX Take 17 g by mouth daily. What changed:    when to take this  reasons to take this   potassium chloride SA 20 MEQ tablet Commonly known as:  K-DUR,KLOR-CON Take 1 tablet (20 mEq total) by mouth every other day. When he takes lasix   pravastatin 80 MG tablet Commonly known as:  PRAVACHOL Take 1 tablet (80 mg total) by mouth at bedtime.   predniSONE 10 MG tablet Commonly known as:  DELTASONE Take 2 tablets (20 mg total) by mouth daily with breakfast for 2 days, THEN 1 tablet (10 mg total) daily with breakfast for 3 days. Start taking on:  08/05/2018      Allergies  Allergen Reactions  . Ace Inhibitors Other (See Comments)    Hyperkalemia--07/23/2013:patient states not familiar with the following allergy    The results of significant diagnostics from this hospitalization (including imaging, microbiology, ancillary and laboratory) are listed below for reference.    Significant Diagnostic Studies: Dg Chest 2 View  Result Date: 07/31/2018 CLINICAL DATA:  Shortness of breath. EXAM: CHEST - 2 VIEW COMPARISON:  Radiograph April 17, 2018. FINDINGS: Stable cardiomediastinal silhouette. Stable interstitial densities are noted throughout both lungs most consistent with scarring. No acute abnormality is noted. Bony thorax is unremarkable. No pneumothorax or pleural effusion is noted. IMPRESSION: Stable interstitial densities noted throughout both lungs most consistent with scarring. No definite acute abnormality is noted. Electronically Signed   By: Marijo Conception, M.D.   On: 07/31/2018 17:34   US Renal  Result Date: 08/04/2018 CLINICAL DATA:  Acute  kidney injury. EXAM: RENAL / URINARY TRACT ULTRASOUND COMPLETE COMPARISON:  None. FINDINGS: Right Kidney: Renal measurements: 9.5 x 9.5 x 6.4 cm = volume: 143.0 mL . Echogenicity within normal limits. There is some thinning of the renal cortex. No mass or hydronephrosis visualized. Left Kidney: Renal measurements: 10.0 x 6.5 x 6.6 cm = volume: 224.3 mL. Echogenicity within normal limits. There is some thinning of the renal cortex. No mass or hydronephrosis visualized. Bladder: Appears normal for degree of bladder distention. IMPRESSION: 1. Thinning of the renal cortex bilaterally. This is nonspecific, but can be seen in the setting medical renal disease. 2. No mass, stone, or obstructive disease. Electronically Signed   By: San Morelle M.D.   On: 08/04/2018 12:18   Labs: Basic Metabolic Panel: Recent Labs  Lab 07/31/18 1731 08/02/18 0526 08/02/18 1317 08/03/18 0558 08/04/18 0447 08/05/18 2595  NA 126* 129*  --  131* 131* 134*  K 5.1 5.4*  --  4.8 4.6 4.2  CL 89* 91*  --  93* 94* 96*  CO2 29 31  --  31 32 34*  GLUCOSE 234* 361* 432* 181* 136* 96  BUN 23 40*  --  50* 60* 58*  CREATININE 1.13 1.66*  --  1.70* 1.71* 1.61*  CALCIUM 8.8* 8.5*  --  8.6* 8.3* 8.5*   Liver Function Tests: Recent Labs  Lab 07/31/18 1731  AST 22  ALT 19  ALKPHOS 92  BILITOT 0.5  PROT 6.4*  ALBUMIN 3.6   CBC: Recent Labs  Lab 07/31/18 1731  WBC 6.2  NEUTROABS 4.0  HGB 10.4*  HCT 33.3*  MCV 89.5  PLT 233    Recent Labs  Lab 07/31/18 1731  TROPONINI <0.03    Recent Labs    02/17/18 0045 04/17/18 1758 07/31/18 1731  BNP 169.0* 428.0* 86.0   CBG: Recent Labs  Lab 08/04/18 1304 08/04/18 1614 08/04/18 2122 08/05/18 0745 08/05/18 0807  GLUCAP 211* 260* 303* 62* 88    Principal Problem:   Acute on chronic respiratory failure with hypoxemia ([name]) Active Problems:   Diabetes mellitus type 2, uncontrolled ([name])   AF (paroxysmal atrial fibrillation) ([name])   CKD (chronic  kidney disease), stage III ([name])   Acute on chronic diastolic CHF (congestive heart failure) ([name])   COPD with acute exacerbation ([place])   Hyponatremia   Time coordinating discharge: 35 minutes  Signed:  Murray Hodgkins, MD [facility] 08/05/2018, 9:24 AM

## 2018-08-07 ENCOUNTER — Telehealth: Payer: Self-pay

## 2018-08-07 NOTE — Telephone Encounter (Signed)
Received fax from [place]  patient assistance foundation for renewal for [employer]. Call was placed to patient to get income verification. Patient is unaware of his income status monthly or yearly. Patient also did not know if he had spent  more than 3% of his annual income on prescriptions.    Patient has an appointment in our office on 08/09/2018 and was advised to bring the information with him to the visit so we could fax over the renewal which is set to end 10/01/2108. Patient verbalized understanding

## 2018-08-08 ENCOUNTER — Other Ambulatory Visit: Payer: Self-pay | Admitting: *Deleted

## 2018-08-08 NOTE — Patient Outreach (Signed)
Referral received from hospital liason, pt hospitalized 10/30-11/4/19 with dyspnea, COPD exacerbation, pt with history pulmonary fibrosis, hyponatremia, CKD stage 3, DM type 2 with AIC 6.9 in June 2019.  MD office provides transition of care. Telephone call to pt, HIPAA verified, screening completed, pt reports his primary care MD office is assisting him to qualify for help with eliquis, pt reports he has all medications and taking as prescribed, pt to see primary care MD tomorrow, pt states transportation provided by wife and son, pt states he is on oxygen at 3 liters continuously, pt reports he checks CBG 1-3 times daily with readings up to 300 and as low as 50-60, does not record readings, does not weigh, states " and I'm not gonna weigh, I don't have time to keep up with all this stuff"  Pt states he would be receptive to assistance with managing diabetes, does not feel he needs or wants help with COPD or CHF.  Pt is agreeable to home visit next week.  PLAN See pt for initial home visit next week  Jacqlyn Larsen [facility], [place] Coordinator [phone]

## 2018-08-09 ENCOUNTER — Other Ambulatory Visit: Payer: Self-pay

## 2018-08-09 ENCOUNTER — Ambulatory Visit (INDEPENDENT_AMBULATORY_CARE_PROVIDER_SITE_OTHER): Payer: Medicare Other | Admitting: Family Medicine

## 2018-08-09 ENCOUNTER — Encounter: Payer: Self-pay | Admitting: Family Medicine

## 2018-08-09 VITALS — BP 148/78 | HR 102 | Temp 98.7°F | Resp 16 | Ht 65.0 in | Wt 205.0 lb

## 2018-08-09 DIAGNOSIS — N179 Acute kidney failure, unspecified: Secondary | ICD-10-CM

## 2018-08-09 DIAGNOSIS — N183 Chronic kidney disease, stage 3 unspecified: Secondary | ICD-10-CM

## 2018-08-09 NOTE — Progress Notes (Signed)
Patient ID: Edward [name], male    DOB: [birth year], 83 y.o.   MRN: [id number]  PCP: Orlena Sheldon, PA-C  Chief Complaint  Patient presents with  . Hospital F/U    COPD exacerbation/ edema    Subjective:   Edward [name] is a 83 y.o. male, presents to clinic for hospital f/u after admission for acute kidney injury and COPD exacerbation superimposed on pulmonary fibrosis, COPD and chronic hypoxic respiratory failure on 3 L via nasal cannula at home.  He is frequently noncompliant multiple medications, and usually has some difficulty with his blood sugars.  He states today that he has continued to do well with his breathing, at his baseline, still using 3 L. Pt had fluid overload/CHF exacerbation as well after aggressive IV diuresis - lasix was to be held for 2 days after discharge and then resumed - he has not been taking lasix as prescribed due to se of urinary frequency.   He completed abx.  He was to hold losartan and levemir Having elevated blood sugars with 20 basaglar BID Steroids had short taper, 20 mg x 2 d and then 10 mg x 3 d   Patient Active Problem List   Diagnosis Date Noted  . COPD with acute exacerbation ([place]) 07/31/2018  . Hyponatremia 07/31/2018  . Iron deficiency anemia due to chronic blood loss 04/30/2018  . Respiratory distress   . Hematochezia   . Goals of care, counseling/discussion   . Palliative care by specialist   . DNR (do not resuscitate) discussion   . Acute renal failure with acute tubular necrosis superimposed on stage 3 chronic kidney disease ([place])   . Palliative care encounter   . COPD exacerbation ([place]) 04/17/2018  . Acute on chronic diastolic CHF (congestive heart failure) ([place])   . Severe anemia 02/17/2018  . Bradycardia 02/17/2018  . Hypothermia   . Gastroesophageal reflux disease   . Acute encephalopathy 09/25/2017  . On home O2 09/25/2017  . Insomnia 03/28/2017  . CKD (chronic kidney disease), stage III ([place]) 03/07/2017  . AF  (paroxysmal atrial fibrillation) ([place]) 03/05/2017  . Chronic diastolic CHF (congestive heart failure) ([place]) 03/04/2017  . HCAP (healthcare-associated pneumonia) 03/04/2017  . Sepsis due to pneumonia ([place]) 02/25/2017  . Constipation 02/25/2017  . Overflow diarrhea/Constipation 02/25/2017  . Non compliance w medication regimen 12/02/2015  . Hypercholesterolemia 05/12/2014  . Acute on chronic respiratory failure with hypoxemia ([place]) 11/17/2013  . Elevated PSA 01/20/2013  . Diabetes mellitus type 2, uncontrolled ([place])   . COPD (chronic obstructive pulmonary disease) ([place])   . Hypertension   . Elevated lipids   . Pulmonary fibrosis ([place])   . Colon polyps   . Colon polyps     Current Meds  Medication Sig  . albuterol (PROVENTIL) (2.5 MG/3ML) 0.083% nebulizer solution INHALE 1 VIAL VIA NEBULIZER EVERY 6 HOURS AS NEEDED FOR WHEEZING OR SHORTNESS OF BREATH (Patient taking differently: Take 2.5 mg by nebulization every 6 (six) hours as needed for wheezing or shortness of breath. )  . apixaban (ELIQUIS) 2.5 MG TABS tablet Take 1 tablet (2.5 mg total) by mouth 2 (two) times daily. Restart on 7/24  . aspirin EC 81 MG tablet Take 1 tablet (81 mg total) by mouth daily. Resume on 7/24  . budesonide (PULMICORT) 0.5 MG/2ML nebulizer solution Take 2 mLs (0.5 mg total) by nebulization 2 (two) times daily.  . cloNIDine (CATAPRES) 0.1 MG tablet TAKE 1 TABLET BY MOUTH 2 TIMES A DAY (Patient  taking differently: Take 0.1 mg by mouth daily. )  . diltiazem (CARDIZEM CD) 240 MG 24 hr capsule TAKE 1 CAPSULE BY MOUTH DAILY (Patient taking differently: Take 240 mg by mouth daily. )  . doxycycline (VIBRA-TABS) 100 MG tablet Take 1 tablet (100 mg total) by mouth every 12 (twelve) hours.  . furosemide (LASIX) 40 MG tablet Take 1 tablet (40 mg total) by mouth every other day. Takes with Potassium 20MEQ. First dose of Lasix 11/6.  . hydrocortisone (ANUSOL-HC) 25 MG suppository Place 1 suppository (25 mg total) rectally 2  (two) times daily as needed for hemorrhoids or anal itching.  . Insulin Glargine (BASAGLAR KWIKPEN) 100 UNIT/ML SOPN Inject 20 Units into the skin 2 (two) times daily.  Marland Kitchen ipratropium (ATROVENT) 0.02 % nebulizer solution INHALE THE CONTENTS OF 1 VIAL VIA NEBULIZATION 4 TIMES DAILY (Patient taking differently: Take 0.5 mg by nebulization 4 (four) times daily. )  . metoprolol tartrate (LOPRESSOR) 25 MG tablet TAKE 1 TABLET BY MOUTH TWICE DAILY (Patient taking differently: Take 25 mg by mouth 2 (two) times daily. )  . ONE TOUCH ULTRA TEST test strip   . ONETOUCH DELICA LANCETS 32I MISC USE TO CHECK BLOOD SUGAR TWICE DAILY AS DIRECTED  . OXYGEN Inhale 3 L into the lungs continuous.   . pantoprazole (PROTONIX) 40 MG tablet Take 1 tablet (40 mg total) by mouth 2 (two) times daily before a meal.  . polyethylene glycol (MIRALAX / GLYCOLAX) packet Take 17 g by mouth daily. (Patient taking differently: Take 17 g by mouth daily as needed for mild constipation or moderate constipation. )  . potassium chloride SA (K-DUR,KLOR-CON) 20 MEQ tablet Take 1 tablet (20 mEq total) by mouth every other day. When he takes lasix  . pravastatin (PRAVACHOL) 80 MG tablet Take 1 tablet (80 mg total) by mouth at bedtime.  . predniSONE (DELTASONE) 10 MG tablet Take 2 tablets (20 mg total) by mouth daily with breakfast for 2 days, THEN 1 tablet (10 mg total) daily with breakfast for 3 days.     Review of Systems  Constitutional: Negative.   HENT: Negative.   Eyes: Negative.   Respiratory: Negative.   Cardiovascular: Negative.   Gastrointestinal: Negative.   Endocrine: Negative.   Genitourinary: Negative.   Musculoskeletal: Negative.   Skin: Negative.   Allergic/Immunologic: Negative.   Neurological: Negative.   Hematological: Negative.   Psychiatric/Behavioral: Negative.   All other systems reviewed and are negative.      Objective:    Vitals:   08/09/18 1515  BP: (!) 148/78  Pulse: (!) 102  Resp: 16  Temp:  98.7 F (37.1 C)  TempSrc: Oral  SpO2: 99%  Weight: 205 lb (93 kg)  Height: 5\' 5"  (1.651 m)      Physical Exam  Constitutional: He appears well-developed and well-nourished.  Non-toxic appearance. He does not appear ill. No distress.  Chronically ill-appearing male, on oxygen via nasal cannula, no acute distress  HENT:  Head: Normocephalic and atraumatic.  Right Ear: Tympanic membrane, external ear and ear canal normal.  Left Ear: Tympanic membrane, external ear and ear canal normal.  Nose: Nose normal. No mucosal edema or rhinorrhea. Right sinus exhibits no maxillary sinus tenderness and no frontal sinus tenderness. Left sinus exhibits no maxillary sinus tenderness and no frontal sinus tenderness.  Mouth/Throat: Uvula is midline and oropharynx is clear and moist. No trismus in the jaw. No uvula swelling. No oropharyngeal exudate, posterior oropharyngeal edema or posterior oropharyngeal erythema.  Eyes:  Pupils are equal, round, and reactive to light. Conjunctivae, EOM and lids are normal. No scleral icterus.  Neck: Trachea normal, normal range of motion and phonation normal. Neck supple. No tracheal deviation present.  Cardiovascular: Regular rhythm, normal heart sounds and normal pulses. Exam reveals no gallop and no friction rub.  No murmur heard. Pulses:      Radial pulses are 2+ on the right side, and 2+ on the left side.       Posterior tibial pulses are 2+ on the right side, and 2+ on the left side.  Pulmonary/Chest: Effort normal. No stridor. No respiratory distress. He has no wheezes. He has no rhonchi. He has rales.  Abdominal: Soft. Normal appearance and bowel sounds are normal. He exhibits no distension. There is no tenderness. There is no rebound and no guarding.  Musculoskeletal: Normal range of motion. He exhibits no edema.  Neurological: He is alert. Coordination and gait normal.  Skin: Skin is warm, dry and intact. Capillary refill takes less than 2 seconds. No rash  noted. He is not diaphoretic.  Psychiatric: He has a normal mood and affect. His speech is normal and behavior is normal.  Nursing note and vitals reviewed.         Assessment & Plan:   Problem List Items Addressed This Visit      Genitourinary   CKD (chronic kidney disease), stage III ([place]) - Primary   Relevant Orders   BASIC METABOLIC PANEL WITH GFR    Other Visit Diagnoses    AKI (acute kidney injury) ([place])       Relevant Orders   BASIC METABOLIC PANEL WITH GFR      Encounter for follow-up after hospital encounter -reports his breathing is back to his baseline, using 3 L of oxygen, he is reporting hyperglycemia with using basal insulin 20 units twice a day, will recheck renal function    Delsa Grana, PA-C 08/09/18 3:25 PM

## 2018-08-10 LAB — BASIC METABOLIC PANEL WITH GFR
BUN/Creatinine Ratio: 28 (calc) — ABNORMAL HIGH (ref 6–22)
BUN: 34 mg/dL — ABNORMAL HIGH (ref 7–25)
CO2: 35 mmol/L — AB (ref 20–32)
Calcium: 8.3 mg/dL — ABNORMAL LOW (ref 8.6–10.3)
Chloride: 94 mmol/L — ABNORMAL LOW (ref 98–110)
Creat: 1.23 mg/dL — ABNORMAL HIGH (ref 0.70–1.11)
GFR, EST NON AFRICAN AMERICAN: 55 mL/min/{1.73_m2} — AB (ref >=60)
GFR, Est African American: 63 mL/min/{1.73_m2} (ref >=60)
GLUCOSE: 239 mg/dL — AB (ref 65–99)
POTASSIUM: 5.9 mmol/L — AB (ref 3.5–5.3)
SODIUM: 136 mmol/L (ref 135–146)

## 2018-08-13 ENCOUNTER — Other Ambulatory Visit: Payer: Self-pay | Admitting: *Deleted

## 2018-08-13 ENCOUNTER — Encounter: Payer: Self-pay | Admitting: *Deleted

## 2018-08-13 NOTE — Patient Outreach (Signed)
[facility]) Care Management   08/13/2018  Edward [name] April 08, 1935 [id number]  Primghar is an 83 y.o. male  Subjective: Initial home visit with pt, HIPAA verified, pt lives with his wife, son and a friend, pt reports he is mostly independent but does not drive, states diabetes "is the thing that gives me the most trouble" with blood sugars as high as 300-400's.  Pt states he does not eat any vegetables and does like them, eats a lot of sandwiches and wife does cook.  Pt states he has scales but is not going to weigh.    Objective:   Vitals:   08/13/18 1234  BP: 140/64  Pulse: 75  Resp: 16  SpO2: 98%  Weight: 195 lb (88.5 kg)  Height: 1.549 m (5\' 1" )  CBG today 95 Ranges from CBG log 95-475 7 day average 310 14 day average 323 30 day average 259  ROS  Physical Exam  Constitutional: He is oriented to person, place, and time. He appears well-developed and well-nourished.  HENT:  Head: Normocephalic.  Neck: Normal range of motion.  Cardiovascular: Normal rate.  Respiratory: He has wheezes.  Inspiratory wheezing auscultated upper lobes anterior  GI: Soft. Bowel sounds are normal.  Musculoskeletal: Normal range of motion. He exhibits edema.  2+ edema lower extremities bil from knees down Legs are tight and hard to touch  Neurological: He is alert and oriented to person, place, and time.  Skin: Skin is warm and dry.  Psychiatric: He has a normal mood and affect. His behavior is normal. Thought content normal.    Encounter Medications:   Outpatient Encounter Medications as of 08/13/2018  Medication Sig Note  . albuterol (PROVENTIL) (2.5 MG/3ML) 0.083% nebulizer solution INHALE 1 VIAL VIA NEBULIZER EVERY 6 HOURS AS NEEDED FOR WHEEZING OR SHORTNESS OF BREATH (Patient taking differently: Take 2.5 mg by nebulization every 6 (six) hours as needed for wheezing or shortness of breath. ) 08/02/2018: Lf 10.30.19  . apixaban (ELIQUIS) 2.5 MG TABS tablet Take 1  tablet (2.5 mg total) by mouth 2 (two) times daily. Restart on 7/24 08/02/2018: No record found  . aspirin EC 81 MG tablet Take 1 tablet (81 mg total) by mouth daily. Resume on 7/24   . cloNIDine (CATAPRES) 0.1 MG tablet TAKE 1 TABLET BY MOUTH 2 TIMES A DAY (Patient taking differently: Take 0.1 mg by mouth daily. ) 08/02/2018: LF 10.30.19  . diltiazem (CARDIZEM CD) 240 MG 24 hr capsule TAKE 1 CAPSULE BY MOUTH DAILY (Patient taking differently: Take 240 mg by mouth daily. ) 08/02/2018: LF 10.30.19  . furosemide (LASIX) 40 MG tablet Take 1 tablet (40 mg total) by mouth every other day. Takes with Potassium 20MEQ. First dose of Lasix 11/6.   . hydrocortisone (ANUSOL-HC) 25 MG suppository Place 1 suppository (25 mg total) rectally 2 (two) times daily as needed for hemorrhoids or anal itching.   . Insulin Glargine (BASAGLAR KWIKPEN) 100 UNIT/ML SOPN Inject 20 Units into the skin 2 (two) times daily.   Marland Kitchen ipratropium (ATROVENT) 0.02 % nebulizer solution INHALE THE CONTENTS OF 1 VIAL VIA NEBULIZATION 4 TIMES DAILY (Patient taking differently: Take 0.5 mg by nebulization 4 (four) times daily. ) 08/02/2018: Lf 10.30.19  . metoprolol tartrate (LOPRESSOR) 25 MG tablet TAKE 1 TABLET BY MOUTH TWICE DAILY (Patient taking differently: Take 25 mg by mouth 2 (two) times daily. ) 08/02/2018: Lf 10.30.19  . ONE TOUCH ULTRA TEST test strip    . [place] DELICA  LANCETS 33G MISC USE TO CHECK BLOOD SUGAR TWICE DAILY AS DIRECTED   . OXYGEN Inhale 3 L into the lungs continuous.    . polyethylene glycol (MIRALAX / GLYCOLAX) packet Take 17 g by mouth daily. (Patient taking differently: Take 17 g by mouth daily as needed for mild constipation or moderate constipation. )   . potassium chloride SA (K-DUR,KLOR-CON) 20 MEQ tablet Take 1 tablet (20 mEq total) by mouth every other day. When he takes lasix 08/02/2018: LF 10.30.19  . pravastatin (PRAVACHOL) 80 MG tablet Take 1 tablet (80 mg total) by mouth at bedtime. 08/02/2018: Lf 10.30.19  .  budesonide (PULMICORT) 0.5 MG/2ML nebulizer solution Take 2 mLs (0.5 mg total) by nebulization 2 (two) times daily. (Patient not taking: Reported on 08/13/2018) 08/02/2018: No current pharmacy record  . doxycycline (VIBRA-TABS) 100 MG tablet Take 1 tablet (100 mg total) by mouth every 12 (twelve) hours. (Patient not taking: Reported on 08/13/2018)   . pantoprazole (PROTONIX) 40 MG tablet Take 1 tablet (40 mg total) by mouth 2 (two) times daily before a meal. (Patient not taking: Reported on 08/13/2018) 08/02/2018: No pharmacy record    No facility-administered encounter medications on file as of 08/13/2018.     Functional Status:   In your present state of health, do you have any difficulty performing the following activities: 08/13/2018 07/31/2018  Hearing? N N  Vision? Y N  Comment pt states he needs eye exam and new glasses but does not feel like going out to appointment at this time, will consider in near future -  Difficulty concentrating or making decisions? N N  Walking or climbing stairs? N N  Dressing or bathing? N N  Doing errands, shopping? N N  Preparing Food and eating ? N -  Using the Toilet? N -  In the past six months, have you accidently leaked urine? N -  Do you have problems with loss of bowel control? N -  Managing your Medications? N -  Managing your Finances? N -  Housekeeping or managing your Housekeeping? Y -  Some recent data might be hidden    Fall/Depression Screening:    Fall Risk  08/13/2018 05/23/2018 09/12/2017  Falls in the past year? 0 No Yes  Number falls in past yr: - - 1  Injury with Fall? - - No   PHQ 2/9 Scores 08/13/2018 08/08/2018 05/23/2018 09/12/2017 08/15/2017 07/20/2017 06/20/2017  PHQ - 2 Score 0 0 0 0 0 0 0  PHQ- 9 Score - - - 0 0 - 0    Assessment:  RN CM observed medications and reviewed with pt, pt unable to read and his wife assists him as needed.  RN CM talked with pt about importance of flu vaccine and pt refuses, talked with pt about  needed eye exam as pt says he has cataracts.  Pt not interested in weighing for CHF, verbalizes he is interested in information regarding COPD and diabetes although he does not make any commitments that he will follow through, pt seems to have limited understanding about disease processes but does not seem enthusiastic to change or work towards goals.  RN CM faxed initial home visit and barrier letter to primary MD, reported CBG readings and averages from glucometer.   THN CM Care Plan Problem One     Most Recent Value  Care Plan Problem One  Knowledge deficit related to diabetes  Care Plan for Problem One  Active  THN Long Term Goal   Pt  will show improvement in blood sugar (100-200's range) within 60 days  THN Long Term Goal Start Date  08/13/18  Interventions for Problem One Long Term Goal  RN CM gave pt [place] calendar, reviewed section on diabetes and ask pt to consistently record CBG readings (pt records sporadically)  THN CM Short Term Goal #1   Pt will verbalize examples of foods that are carbohydrates within 30 days  THN CM Short Term Goal #1 Start Date  08/13/18  Interventions for Short Term Goal #1  RN CM reviewed examples of foods that are carbohydrates and how they affect blood sugar, (RNCM gave pt copy of plate method and pt states he is not interested and does not eat vegetables)  THN CM Short Term Goal #2   Pt will consistently check CBG daily and record in [facility] calendar within 30 days  THN CM Short Term Goal #2 Start Date  08/13/18  Interventions for Short Term Goal #2  RN CM reviewed patient's glucometer and averages, ask pt to check CBG at least daily and record.    THN CM Care Plan Problem Two     Most Recent Value  Care Plan Problem Two  Knowledge deficit related to COPD  Role Documenting the Problem Two  Care Management Coordinator  Care Plan for Problem Two  Active  THN CM Short Term Goal #1   Pt will verbalize COPD action plan within 30 days  THN CM Short Term Goal #1 Start  Date  08/13/18  Interventions for Short Term Goal #2   Pt already has copy of COPD action plan, RN CM reviewed wtih pt and importance of calling MD early for change in health status, RN CM ask pt to avoid sick persons and practice good handwashing.      Plan: outreach pt for telephonic assessment next month Assess CBG  Jacqlyn Larsen [facility], BSN [place] Coordinator [phone]

## 2018-08-19 ENCOUNTER — Ambulatory Visit (INDEPENDENT_AMBULATORY_CARE_PROVIDER_SITE_OTHER): Payer: Medicare Other | Admitting: Family Medicine

## 2018-08-19 ENCOUNTER — Encounter: Payer: Self-pay | Admitting: Family Medicine

## 2018-08-19 VITALS — BP 126/58 | HR 82 | Temp 98.2°F | Resp 20 | Ht 65.0 in | Wt 206.0 lb

## 2018-08-19 DIAGNOSIS — R0602 Shortness of breath: Secondary | ICD-10-CM

## 2018-08-19 DIAGNOSIS — I5032 Chronic diastolic (congestive) heart failure: Secondary | ICD-10-CM

## 2018-08-19 DIAGNOSIS — I4891 Unspecified atrial fibrillation: Secondary | ICD-10-CM | POA: Insufficient documentation

## 2018-08-19 DIAGNOSIS — J441 Chronic obstructive pulmonary disease with (acute) exacerbation: Secondary | ICD-10-CM | POA: Diagnosis not present

## 2018-08-19 DIAGNOSIS — I480 Paroxysmal atrial fibrillation: Secondary | ICD-10-CM

## 2018-08-19 DIAGNOSIS — J9621 Acute and chronic respiratory failure with hypoxia: Secondary | ICD-10-CM

## 2018-08-19 MED ORDER — FLUTICASONE-UMECLIDIN-VILANT 100-62.5-25 MCG/INH IN AEPB: 1.0000 | INHALATION_SPRAY | Freq: Every day | RESPIRATORY_TRACT | 5 refills | Status: DC

## 2018-08-19 NOTE — Progress Notes (Signed)
Subjective:    Patient ID: Edward [name], male    DOB: August 30, 1935, 83 y.o.   MRN: [id number]  HPI Patient is an 83 year old Caucasian male here today for shortness of breath and leg swelling.  This is her first encounter.  He has a history of chronic diastolic congestive heart failure currently taking Lasix every other day.  He reports swelling in both legs to his knees.  He has tense +1 pitting edema in both legs to the level of his knee.  The skin overlying both shins is erythematous indurated and firm.  He also reports increasing dyspnea on exertion and increasing shortness of breath.  His weight is up approximately 16 pounds since September suggesting fluid overload.  However on pulmonary exam, he has diminished breath sounds bilaterally with diffuse expiratory wheezing and rhonchorous breath sounds.  He has underlying COPD.  However he is unable to tell me what medication he is taking.  His medicine list includes albuterol as needed ipratropium as needed and Pulmicort.  However it sounds like he is only using albuterol as needed.  He is on no maintenance medication.  He is still smoking.  He is coughing and wheezing throughout our encounter today.  He denies any chest pain.  He denies any pleurisy.  He denies any hemoptysis.  He denies any fever. Past Medical History:  Diagnosis Date  . Allergy    Rhinitis  . Bronchitis   . Chronic respiratory failure ([place])   . Colon polyps   . COPD (chronic obstructive pulmonary disease) ([place])   . Diabetes mellitus   . Elevated lipids   . Hypercholesterolemia   . Hypertension   . Iron deficiency anemia due to chronic blood loss 04/30/2018  . Noncompliance   . On home O2    2L N/C   . PSA elevation   . Pulmonary fibrosis ([place])   . Vitamin D deficiency    Past Surgical History:  Procedure Laterality Date  . BIOPSY  04/23/2018   Procedure: BIOPSY;  Surgeon: Danie Binder, MD;  Location: [facility];  Service: Endoscopy;;  duodenum gastric  .  CATARACT EXTRACTION W/PHACO  06/25/2012   Procedure: CATARACT EXTRACTION PHACO AND INTRAOCULAR LENS PLACEMENT (IOC);  Surgeon: Elta Guadeloupe T. Gershon Crane, MD;  Location: [facility] ORS;  Service: Ophthalmology;  Laterality: Left;  CDE=19.01  . CATARACT EXTRACTION W/PHACO  07/09/2012   Procedure: CATARACT EXTRACTION PHACO AND INTRAOCULAR LENS PLACEMENT (IOC);  Surgeon: Elta Guadeloupe T. Gershon Crane, MD;  Location: [facility] ORS;  Service: Ophthalmology;  Laterality: Right;  CDE: 20.09  . COLONOSCOPY WITH PROPOFOL N/A 04/23/2018   Procedure: COLONOSCOPY WITH PROPOFOL;  Surgeon: Danie Binder, MD;  Location: [facility];  Service: Endoscopy;  Laterality: N/A;  . ESOPHAGOGASTRODUODENOSCOPY (EGD) WITH PROPOFOL N/A 04/23/2018   Procedure: ESOPHAGOGASTRODUODENOSCOPY (EGD) WITH PROPOFOL;  Surgeon: Danie Binder, MD;  Location: [facility];  Service: Endoscopy;  Laterality: N/A;   Current Outpatient Medications on File Prior to Visit  Medication Sig Dispense Refill  . albuterol (PROVENTIL) (2.5 MG/3ML) 0.083% nebulizer solution INHALE 1 VIAL VIA NEBULIZER EVERY 6 HOURS AS NEEDED FOR WHEEZING OR SHORTNESS OF BREATH (Patient taking differently: Take 2.5 mg by nebulization every 6 (six) hours as needed for wheezing or shortness of breath. ) 360 mL 4  . apixaban (ELIQUIS) 2.5 MG TABS tablet Take 1 tablet (2.5 mg total) by mouth 2 (two) times daily. Restart on 7/24 120 tablet 0  . aspirin EC 81 MG tablet Take 1 tablet (  81 mg total) by mouth daily. Resume on 7/24    . budesonide (PULMICORT) 0.5 MG/2ML nebulizer solution Take 2 mLs (0.5 mg total) by nebulization 2 (two) times daily. 120 mL 5  . cloNIDine (CATAPRES) 0.1 MG tablet TAKE 1 TABLET BY MOUTH 2 TIMES A DAY (Patient taking differently: Take 0.1 mg by mouth daily. ) 60 tablet 2  . diltiazem (CARDIZEM CD) 240 MG 24 hr capsule TAKE 1 CAPSULE BY MOUTH DAILY (Patient taking differently: Take 240 mg by mouth daily. ) 30 capsule 2  . furosemide (LASIX) 40 MG tablet Take 1 tablet (40 mg total) by  mouth every other day. Takes with Potassium 20MEQ. First dose of Lasix 11/6.    . hydrocortisone (ANUSOL-HC) 25 MG suppository Place 1 suppository (25 mg total) rectally 2 (two) times daily as needed for hemorrhoids or anal itching.    . Insulin Glargine (BASAGLAR KWIKPEN) 100 UNIT/ML SOPN Inject 20 Units into the skin 2 (two) times daily.    Marland Kitchen ipratropium (ATROVENT) 0.02 % nebulizer solution INHALE THE CONTENTS OF 1 VIAL VIA NEBULIZATION 4 TIMES DAILY (Patient taking differently: Take 0.5 mg by nebulization 4 (four) times daily. ) 75 mL 11  . metoprolol tartrate (LOPRESSOR) 25 MG tablet TAKE 1 TABLET BY MOUTH TWICE DAILY (Patient taking differently: Take 25 mg by mouth 2 (two) times daily. ) 60 tablet 0  . ONE TOUCH ULTRA TEST test strip     . ONETOUCH DELICA LANCETS 09B MISC USE TO CHECK BLOOD SUGAR TWICE DAILY AS DIRECTED 100 each 2  . OXYGEN Inhale 3 L into the lungs continuous.     . pantoprazole (PROTONIX) 40 MG tablet Take 1 tablet (40 mg total) by mouth 2 (two) times daily before a meal. 60 tablet 1  . polyethylene glycol (MIRALAX / GLYCOLAX) packet Take 17 g by mouth daily. (Patient taking differently: Take 17 g by mouth daily as needed for mild constipation or moderate constipation. ) 14 each 0  . potassium chloride SA (K-DUR,KLOR-CON) 20 MEQ tablet Take 1 tablet (20 mEq total) by mouth every other day. When he takes lasix 90 tablet 0  . pravastatin (PRAVACHOL) 80 MG tablet Take 1 tablet (80 mg total) by mouth at bedtime. 30 tablet 3   No current facility-administered medications on file prior to visit.    Allergies  Allergen Reactions  . Ace Inhibitors Other (See Comments)    Hyperkalemia--07/23/2013:patient states not familiar with the following allergy   Social History   Socioeconomic History  . Marital status: Married    Spouse name: Not on file  . Number of children: Not on file  . Years of education: Not on file  . Highest education level: Not on file  Occupational History   . 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 Financial resource strain: Not on file  . Food insecurity:    Worry: Not on file    Inability: Not on file  . Transportation needs:    Medical: Not on file    Non-medical: Not on file  Tobacco Use  . Smoking status: Former Smoker    Packs/day: 1.50    Years: 60.00    Pack years: 90.00    Types: Cigarettes    Last attempt to quit: 12/31/2012    Years since quitting: 5.6  . Smokeless tobacco: Never Used  Substance and Sexual Activity  . Alcohol use: No  . Drug use: No  . Sexual activity: Yes  Birth control/protection: None  Lifestyle  . Physical activity:    Days per week: Not on file    Minutes per session: Not on file  . Stress: Not on file  Relationships  . Social connections:    Talks on phone: Not on file    Gets together: Not on file    Attends religious service: Not on file    Active member of club or organization: Not on file    Attends meetings of clubs or organizations: Not on file    Relationship status: Not on file  . Intimate partner violence:    Fear of current or ex partner: Not on file    Emotionally abused: Not on file    Physically abused: Not on file    Forced sexual activity: Not on file  Other Topics Concern  . Not on file  Social History Narrative  . Not on file      Review of Systems  All other systems reviewed and are negative.      Objective:   Physical Exam  Constitutional: He appears well-developed and well-nourished. No distress.  Neck: No JVD present.  Cardiovascular: Normal rate, regular rhythm and normal heart sounds.  Pulmonary/Chest: Effort normal. No accessory muscle usage or stridor. No respiratory distress. He has no decreased breath sounds. He has wheezes. He has rhonchi.  Abdominal: Soft. Bowel sounds are normal. He exhibits no distension and no mass. There is no tenderness. There is no rebound and no guarding.  Musculoskeletal: He exhibits edema.    Skin: He is not diaphoretic.  Vitals reviewed.    Echocardiogram obtained in December 2018 for atrial fibrillation with RVR revealed an ejection fraction of 65 to 70%     Assessment & Plan:  Shortness of breath - Plan: BASIC METABOLIC PANEL WITH GFR, Brain natriuretic peptide  Acute on chronic respiratory failure with hypoxia ([name])  Chronic diastolic CHF (congestive heart failure) ([name])  COPD with acute exacerbation ([name])  I believe this is a combination of poorly controlled COPD, chronic respiratory failure with hypoxia, pulmonary edema and fluid overload due to chronic diastolic heart failure.  I recommended starting Trelegy 1 inhalation a day to better manage his bronchospasm and control his COPD.  I also recommended increasing diuresis by starting Lasix 40 mg twice daily and potassium/K. Dur 20 mEq once daily for the remainder of this week and then recheck on Friday to evaluate his diuresis.  I will check a BMP today and then recheck another BMP at his follow-up appointment on Friday.  Obtain a baseline BNP today.  Encouraged smoking cessation

## 2018-08-20 LAB — BASIC METABOLIC PANEL WITH GFR
BUN / CREAT RATIO: 17 (calc) (ref 6–22)
BUN: 20 mg/dL (ref 7–25)
CHLORIDE: 94 mmol/L — AB (ref 98–110)
CO2: 33 mmol/L — ABNORMAL HIGH (ref 20–32)
Calcium: 8.3 mg/dL — ABNORMAL LOW (ref 8.6–10.3)
Creat: 1.21 mg/dL — ABNORMAL HIGH (ref 0.70–1.11)
GFR, EST AFRICAN AMERICAN: 65 mL/min/{1.73_m2} (ref >=60)
GFR, Est Non African American: 56 mL/min/{1.73_m2} — ABNORMAL LOW (ref >=60)
GLUCOSE: 141 mg/dL — AB (ref 65–99)
POTASSIUM: 4.6 mmol/L (ref 3.5–5.3)
SODIUM: 134 mmol/L — AB (ref 135–146)

## 2018-08-20 LAB — BRAIN NATRIURETIC PEPTIDE: BRAIN NATRIURETIC PEPTIDE: 101 pg/mL — AB (ref <100)

## 2018-08-23 ENCOUNTER — Ambulatory Visit: Payer: Medicare Other | Admitting: Family Medicine

## 2018-08-23 ENCOUNTER — Ambulatory Visit (INDEPENDENT_AMBULATORY_CARE_PROVIDER_SITE_OTHER): Payer: Medicare Other | Admitting: Family Medicine

## 2018-08-23 ENCOUNTER — Encounter: Payer: Self-pay | Admitting: Family Medicine

## 2018-08-23 VITALS — BP 138/64 | HR 76 | Temp 98.0°F | Resp 20 | Ht 65.0 in | Wt 201.0 lb

## 2018-08-23 DIAGNOSIS — N183 Chronic kidney disease, stage 3 unspecified: Secondary | ICD-10-CM

## 2018-08-23 DIAGNOSIS — J449 Chronic obstructive pulmonary disease, unspecified: Secondary | ICD-10-CM

## 2018-08-23 DIAGNOSIS — R0602 Shortness of breath: Secondary | ICD-10-CM

## 2018-08-23 DIAGNOSIS — I5032 Chronic diastolic (congestive) heart failure: Secondary | ICD-10-CM

## 2018-08-23 MED ORDER — CEPHALEXIN 500 MG PO CAPS: 500.0000 mg | ORAL_CAPSULE | Freq: Three times a day (TID) | ORAL | 0 refills | Status: DC

## 2018-08-23 NOTE — Progress Notes (Signed)
Subjective:    Patient ID: Edward [name], male    DOB: 1935/02/08, 83 y.o.   MRN: [id number]  HPI  08/19/18 Patient is an 83 year old Caucasian male here today for shortness of breath and leg swelling.  This is his first encounter.  He has a history of chronic diastolic congestive heart failure currently taking Lasix every other day.  He reports swelling in both legs to his knees.  He has tense +1 pitting edema in both legs to the level of his knee.  The skin overlying both shins is erythematous indurated and firm.  He also reports increasing dyspnea on exertion and increasing shortness of breath.  His weight is up approximately 16 pounds since September suggesting fluid overload.  However on pulmonary exam, he has diminished breath sounds bilaterally with diffuse expiratory wheezing and rhonchorous breath sounds.  He has underlying COPD.  However he is unable to tell me what medication he is taking.  His medicine list includes albuterol as needed ipratropium as needed and Pulmicort.  However it sounds like he is only using albuterol as needed.  He is on no maintenance medication.  He is still smoking.  He is coughing and wheezing throughout our encounter today.  He denies any chest pain.  He denies any pleurisy.  He denies any hemoptysis.  He denies any fever.  At that time, my plan was: I believe this is a combination of poorly controlled COPD, chronic respiratory failure with hypoxia, pulmonary edema and fluid overload due to chronic diastolic heart failure.  I recommended starting Trelegy 1 inhalation a day to better manage his bronchospasm and control his COPD.  I also recommended increasing diuresis by starting Lasix 40 mg twice daily and potassium/K. Dur 20 mEq once daily for the remainder of this week and then recheck on Friday to evaluate his diuresis.  I will check a BMP today and then recheck another BMP at his follow-up appointment on Friday.  Obtain a baseline BNP today.    08/23/18 Patient  is here today for follow-up.  He is very concerned because the Trelegy was expensive.  However he does state his breathing has improved on Trelegy.  He has lost 5 pounds since his last office visit.  However he continues to have tense pitting edema in both legs distal to the knee.  The skin over the left leg is erythematous warm and tender to the touch from his mid shin up to the level of his knee as diagrammed below   Past Medical History:  Diagnosis Date  . Allergy    Rhinitis  . Atrial fibrillation ([place])   . Bronchitis   . Chronic respiratory failure ([place])   . Colon polyps   . COPD (chronic obstructive pulmonary disease) ([place])   . Diabetes mellitus   . Elevated lipids   . Hypercholesterolemia   . Hypertension   . Iron deficiency anemia due to chronic blood loss 04/30/2018  . Noncompliance   . On home O2    2L N/C   . PSA elevation   . Pulmonary fibrosis ([place])   . Vitamin D deficiency    Past Surgical History:  Procedure Laterality Date  . BIOPSY  04/23/2018   Procedure: BIOPSY;  Surgeon: Danie Binder, MD;  Location: [facility];  Service: Endoscopy;;  duodenum gastric  . CATARACT EXTRACTION W/PHACO  06/25/2012   Procedure: CATARACT EXTRACTION PHACO AND INTRAOCULAR LENS PLACEMENT (IOC);  Surgeon: Elta Guadeloupe T. Gershon Crane, MD;  Location: [facility];  Service: Ophthalmology;  Laterality: Left;  CDE=19.01  . CATARACT EXTRACTION W/PHACO  07/09/2012   Procedure: CATARACT EXTRACTION PHACO AND INTRAOCULAR LENS PLACEMENT (IOC);  Surgeon: Elta Guadeloupe T. Gershon Crane, MD;  Location: [facility];  Service: Ophthalmology;  Laterality: Right;  CDE: 20.09  . COLONOSCOPY WITH PROPOFOL N/A 04/23/2018   Procedure: COLONOSCOPY WITH PROPOFOL;  Surgeon: Danie Binder, MD;  Location: [facility];  Service: Endoscopy;  Laterality: N/A;  . ESOPHAGOGASTRODUODENOSCOPY (EGD) WITH PROPOFOL N/A 04/23/2018   Procedure: ESOPHAGOGASTRODUODENOSCOPY (EGD) WITH PROPOFOL;  Surgeon: Danie Binder, MD;  Location: [facility];  Service:  Endoscopy;  Laterality: N/A;   Current Outpatient Medications on File Prior to Visit  Medication Sig Dispense Refill  . albuterol (PROVENTIL) (2.5 MG/3ML) 0.083% nebulizer solution INHALE 1 VIAL VIA NEBULIZER EVERY 6 HOURS AS NEEDED FOR WHEEZING OR SHORTNESS OF BREATH (Patient taking differently: Take 2.5 mg by nebulization every 6 (six) hours as needed for wheezing or shortness of breath. ) 360 mL 4  . apixaban (ELIQUIS) 2.5 MG TABS tablet Take 1 tablet (2.5 mg total) by mouth 2 (two) times daily. Restart on 7/24 120 tablet 0  . aspirin EC 81 MG tablet Take 1 tablet (81 mg total) by mouth daily. Resume on 7/24    . budesonide (PULMICORT) 0.5 MG/2ML nebulizer solution Take 2 mLs (0.5 mg total) by nebulization 2 (two) times daily. 120 mL 5  . cloNIDine (CATAPRES) 0.1 MG tablet TAKE 1 TABLET BY MOUTH 2 TIMES A DAY (Patient taking differently: Take 0.1 mg by mouth daily. ) 60 tablet 2  . diltiazem (CARDIZEM CD) 240 MG 24 hr capsule TAKE 1 CAPSULE BY MOUTH DAILY (Patient taking differently: Take 240 mg by mouth daily. ) 30 capsule 2  . Fluticasone-Umeclidin-Vilant (TRELEGY ELLIPTA) 100-62.5-25 MCG/INH AEPB Inhale 1 Inhaler into the lungs daily. 1 each 5  . furosemide (LASIX) 40 MG tablet Take 1 tablet (40 mg total) by mouth every other day. Takes with Potassium 20MEQ. First dose of Lasix 11/6.    . hydrocortisone (ANUSOL-HC) 25 MG suppository Place 1 suppository (25 mg total) rectally 2 (two) times daily as needed for hemorrhoids or anal itching.    . Insulin Glargine (BASAGLAR KWIKPEN) 100 UNIT/ML SOPN Inject 20 Units into the skin 2 (two) times daily.    Marland Kitchen ipratropium (ATROVENT) 0.02 % nebulizer solution INHALE THE CONTENTS OF 1 VIAL VIA NEBULIZATION 4 TIMES DAILY (Patient taking differently: Take 0.5 mg by nebulization 4 (four) times daily. ) 75 mL 11  . metoprolol tartrate (LOPRESSOR) 25 MG tablet TAKE 1 TABLET BY MOUTH TWICE DAILY (Patient taking differently: Take 25 mg by mouth 2 (two) times daily.  ) 60 tablet 0  . ONE TOUCH ULTRA TEST test strip     . ONETOUCH DELICA LANCETS 66Z MISC USE TO CHECK BLOOD SUGAR TWICE DAILY AS DIRECTED 100 each 2  . OXYGEN Inhale 3 L into the lungs continuous.     . pantoprazole (PROTONIX) 40 MG tablet Take 1 tablet (40 mg total) by mouth 2 (two) times daily before a meal. 60 tablet 1  . polyethylene glycol (MIRALAX / GLYCOLAX) packet Take 17 g by mouth daily. (Patient taking differently: Take 17 g by mouth daily as needed for mild constipation or moderate constipation. ) 14 each 0  . potassium chloride SA (K-DUR,KLOR-CON) 20 MEQ tablet Take 1 tablet (20 mEq total) by mouth every other day. When he takes lasix 90 tablet 0  . pravastatin (PRAVACHOL) 80 MG tablet Take 1  tablet (80 mg total) by mouth at bedtime. 30 tablet 3   No current facility-administered medications on file prior to visit.    Allergies  Allergen Reactions  . Ace Inhibitors Other (See Comments)    Hyperkalemia--07/23/2013:patient states not familiar with the following allergy   Social History   Socioeconomic History  . Marital status: Married    Spouse name: Not on file  . Number of children: Not on file  . Years of education: Not on file  . Highest education level: Not on file  Occupational History  . 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 Financial resource strain: Not on file  . Food insecurity:    Worry: Not on file    Inability: Not on file  . Transportation needs:    Medical: Not on file    Non-medical: Not on file  Tobacco Use  . Smoking status: Former Smoker    Packs/day: 1.50    Years: 60.00    Pack years: 90.00    Types: Cigarettes    Last attempt to quit: 12/31/2012    Years since quitting: 5.6  . Smokeless tobacco: Never Used  Substance and Sexual Activity  . Alcohol use: No  . Drug use: No  . Sexual activity: Yes    Birth control/protection: None  Lifestyle  . Physical activity:    Days per week: Not on file    Minutes per  session: Not on file  . Stress: Not on file  Relationships  . Social connections:    Talks on phone: Not on file    Gets together: Not on file    Attends religious service: Not on file    Active member of club or organization: Not on file    Attends meetings of clubs or organizations: Not on file    Relationship status: Not on file  . Intimate partner violence:    Fear of current or ex partner: Not on file    Emotionally abused: Not on file    Physically abused: Not on file    Forced sexual activity: Not on file  Other Topics Concern  . Not on file  Social History Narrative  . Not on file      Review of Systems  All other systems reviewed and are negative.      Objective:   Physical Exam  Constitutional: He appears well-developed and well-nourished. No distress.  Neck: No JVD present.  Cardiovascular: Normal rate, regular rhythm and normal heart sounds.  Pulmonary/Chest: Effort normal. No accessory muscle usage or stridor. No respiratory distress. He has no decreased breath sounds. He has wheezes. He has rhonchi.  Abdominal: Soft. Bowel sounds are normal. He exhibits no distension and no mass. There is no tenderness. There is no rebound and no guarding.  Musculoskeletal: He exhibits edema.       Legs: Skin: He is not diaphoretic. There is erythema.  Vitals reviewed.    Echocardiogram obtained in December 2018 for atrial fibrillation with RVR revealed an ejection fraction of 65 to 70%     Assessment & Plan:  Shortness of breath - Plan: CBC with Differential/Platelet, COMPLETE METABOLIC PANEL WITH GFR  Edema has improved.  I will decrease Lasix from 40 mg twice daily to 40 mg once daily.  I will check a CMP to monitor his kidney function and his potassium to avoid prerenal azotemia.  Recheck the patient in 2 weeks.  Meanwhile treat cellulitis with Keflex 500  mg p.o. 3 times daily for 7 days.  Continue Trelegy for his emphysema

## 2018-08-24 LAB — COMPLETE METABOLIC PANEL WITH GFR
AG Ratio: 1.7 (calc) (ref 1.0–2.5)
ALT: 8 U/L — AB (ref 9–46)
AST: 13 U/L (ref 10–35)
Albumin: 3.3 g/dL — ABNORMAL LOW (ref 3.6–5.1)
Alkaline phosphatase (APISO): 86 U/L (ref 40–115)
BUN/Creatinine Ratio: 13 (calc) (ref 6–22)
BUN: 15 mg/dL (ref 7–25)
CALCIUM: 8.7 mg/dL (ref 8.6–10.3)
CO2: 33 mmol/L — AB (ref 20–32)
CREATININE: 1.16 mg/dL — AB (ref 0.70–1.11)
Chloride: 91 mmol/L — ABNORMAL LOW (ref 98–110)
GFR, Est African American: 68 mL/min/{1.73_m2} (ref >=60)
GFR, Est Non African American: 59 mL/min/{1.73_m2} — ABNORMAL LOW (ref >=60)
GLOBULIN: 1.9 g/dL (ref 1.9–3.7)
GLUCOSE: 124 mg/dL — AB (ref 65–99)
Potassium: 4.5 mmol/L (ref 3.5–5.3)
SODIUM: 130 mmol/L — AB (ref 135–146)
Total Bilirubin: 0.2 mg/dL (ref 0.2–1.2)
Total Protein: 5.2 g/dL — ABNORMAL LOW (ref 6.1–8.1)

## 2018-08-24 LAB — CBC WITH DIFFERENTIAL/PLATELET
Basophils Absolute: 39 cells/uL (ref 0–200)
Basophils Relative: 0.8 %
EOS PCT: 10.1 %
Eosinophils Absolute: 495 cells/uL (ref 15–500)
HEMATOCRIT: 29.6 % — AB (ref 38.5–50.0)
Hemoglobin: 10 g/dL — ABNORMAL LOW (ref 13.2–17.1)
LYMPHS ABS: 1333 {cells}/uL (ref 850–3900)
MCH: 29.1 pg (ref 27.0–33.0)
MCHC: 33.8 g/dL (ref 32.0–36.0)
MCV: 86 fL (ref 80.0–100.0)
MONOS PCT: 13.8 %
MPV: 10 fL (ref 7.5–12.5)
NEUTROS ABS: 2357 {cells}/uL (ref 1500–7800)
Neutrophils Relative %: 48.1 %
Platelets: 338 10*3/uL (ref 140–400)
RBC: 3.44 10*6/uL — AB (ref 4.20–5.80)
RDW: 14.1 % (ref 11.0–15.0)
Total Lymphocyte: 27.2 %
WBC mixed population: 676 cells/uL (ref 200–950)
WBC: 4.9 10*3/uL (ref 3.8–10.8)

## 2018-08-26 ENCOUNTER — Other Ambulatory Visit: Payer: Self-pay

## 2018-08-26 NOTE — Patient Outreach (Signed)
[facility]) Care Management  08/26/2018  Edward [name] 1934/11/17 [id number]  Successful outreach to Mr. Kapaun.  HIPAA identifiers verified.   Called Mr. Stachnik to confirm his insulin regimen as [place] prepares to begin work on 2020 patient assistance applications.   Mr. Hufnagle states that he is only using Humalog 20 units twice daily. He states his long acting insulin was discontinued after his last hospitalization.   According to office notes on 08/23/18 from Dr. Dennard Schaumann, Mr. Cowie should be on Basaglar 20 units twice daily.  Humalog is not on his EMR medication list.   Mr. Kamel also cannot tell me what nebulizing solutions (albuterol, ipratropium and budesonide are all on his profile) he is is using.  He states that he does not know if he takes Cardizem CD.  His family was able to read the names of some of his prescription bottles, but did not find a Cardizem bottle when questioned.  Plan: Call placed to PCP to clarify med list.    [name], [place] [phone]

## 2018-08-28 ENCOUNTER — Telehealth: Payer: Self-pay | Admitting: Family Medicine

## 2018-08-28 ENCOUNTER — Other Ambulatory Visit: Payer: Self-pay

## 2018-08-28 ENCOUNTER — Other Ambulatory Visit: Payer: Self-pay | Admitting: Family Medicine

## 2018-08-28 DIAGNOSIS — R600 Localized edema: Secondary | ICD-10-CM

## 2018-08-28 DIAGNOSIS — Z09 Encounter for follow-up examination after completed treatment for conditions other than malignant neoplasm: Secondary | ICD-10-CM

## 2018-08-28 DIAGNOSIS — I5032 Chronic diastolic (congestive) heart failure: Secondary | ICD-10-CM

## 2018-08-28 MED ORDER — DILTIAZEM HCL ER COATED BEADS 240 MG PO CP24: 240.0000 mg | ORAL_CAPSULE | Freq: Every day | ORAL | 2 refills | Status: DC

## 2018-08-28 NOTE — Patient Outreach (Signed)
[name]) Care Management  08/28/2018  Edward [name] 12/09/1934 [id number]  Incoming call received from Edward [name] at Dr. Aaron Mose, Edward [name]'s office.  Nurse reports that Edward [name] should:  1.  Stop Humalog 2.  Resume Basaglar 20 units twice daily. 3.  Continue to hold losartan (will evaluate renal function at next office visit) 4.  D/C Atrovent and Pulmicort nebs.  He should continue Albuterol nebs.  5.  Continue Trelegy inhaler.   6.  Continue Dilitiazem 240 mg daily.  . Medications Reviewed Today    Reviewed by Edward [name], Edward [name] (Registered Medical Assistant) on 08/28/18 at Edward [name] List Status: <None>  Medication Order Taking? Sig Documenting Provider Last Dose Status Informant  albuterol (PROVENTIL) (2.5 MG/3ML) 0.083% nebulizer solution [id number] No INHALE 1 VIAL VIA NEBULIZER EVERY 6 HOURS AS NEEDED FOR WHEEZING OR SHORTNESS OF BREATH  Patient taking differently:  Take 2.5 mg by nebulization every 6 (six) hours as needed for wheezing or shortness of breath.    Edward Sheldon, Edward [name] Taking Active Multiple Informants           Med Note Edward [name], Edward [name]   Fri Aug 02, 2018 12:30 PM) Lf 10.30.19  apixaban (ELIQUIS) 2.5 MG TABS tablet [id number] No Take 1 tablet (2.5 mg total) by mouth 2 (two) times daily. Restart on 7/24 Edward Dike, Edward [name] Taking Active Multiple Informants           Med Note Edward [name], Marissa Nestle   Fri Aug 02, 2018 12:23 PM) No record found  aspirin EC 81 MG tablet [id number] No Take 1 tablet (81 mg total) by mouth daily. Resume on 7/24 Edward Dike, Edward [name] Taking Active Multiple Informants       Patient not taking:       Discontinued 08/28/18 8850            Med Note Edward [name], Edward [name]   Fri Aug 02, 2018 12:23 PM) No current pharmacy record  cephALEXin (KEFLEX) 500 MG capsule [id number] No Take 1 capsule (500 mg total) by mouth 3 (three) times daily. Edward Frizzle, Edward [name] Taking Active   cloNIDine (CATAPRES) 0.1 MG tablet [id number]  No TAKE 1 TABLET BY MOUTH 2 TIMES A DAY  Patient taking differently:  Take 0.1 mg by mouth 2 (two) times daily.    Edward Sheldon, Edward [name] Taking Active Multiple Informants           Med Note Edward [name], Edward [name]   Fri Aug 02, 2018 12:27 PM) LF 10.30.19  diltiazem (CARDIZEM CD) 240 MG 24 hr capsule [id number] No TAKE 1 CAPSULE BY MOUTH DAILY  Patient taking differently:  Take 240 mg by mouth daily.    Edward Frizzle, Edward [name] Unknown Active Multiple Informants           Med Note Edward [name], Edward [name]   Fri Aug 02, 2018 12:27 PM) LF 10.30.19  Fluticasone-Umeclidin-Vilant (TRELEGY ELLIPTA) 100-62.5-25 MCG/INH AEPB [id number] No Inhale 1 Inhaler into the lungs daily. Edward Frizzle, Edward [name] Taking Active   furosemide (LASIX) 40 MG tablet [id number] No Take 1 tablet (40 mg total) by mouth every other day. Takes with Potassium 20MEQ. First dose of Lasix 11/6.  Patient taking differently:  Take 40 mg by mouth daily. Takes with Potassium 20MEQ. First dose of Lasix 11/6.   Edward Cota, Edward [name] Taking Active   hydrocortisone (ANUSOL-HC) 25 MG suppository [id number] No Place 1 suppository (25 mg total) rectally 2 (two) times daily as needed  for hemorrhoids or anal itching.  Patient not taking:  Reported on 08/26/2018   Edward Cota, Edward [name] Not Taking Active   Insulin Glargine Edward [name] [name]) 100 UNIT/ML SOPN [id number] No Inject 20 Units into the skin 2 (two) times daily. Provider, Historical, Edward [name] Unknown Active Multiple Informants       Patient taking differently:       Discontinued 08/28/18 0940            Med Note (Edward [name]   Fri Aug 02, 2018 12:30 PM) Lf 10.30.19  metoprolol tartrate (LOPRESSOR) 25 MG tablet [id number] No TAKE 1 TABLET BY MOUTH TWICE DAILY  Patient taking differently:  Take 25 mg by mouth 2 (two) times daily.    Edward Sheldon, Edward [name] Taking Active Multiple Informants           Med Note Edward [name], Marissa Nestle   Fri Aug 02, 2018 12:31 PM) Lf 10.30.19  ONE TOUCH ULTRA TEST test strip [id number]  No  Provider, Historical, Edward [name] Taking Active Multiple Informants  Edward [name] LANCETS 45G Edward [name] [id number] No USE TO CHECK BLOOD SUGAR TWICE DAILY AS DIRECTED Edward Sheldon, Edward [name] Taking Active Multiple Informants  OXYGEN [id number] No Inhale 3 L into the lungs continuous.  Provider, Historical, Edward [name] Taking Active Multiple Informants  pantoprazole (PROTONIX) 40 MG tablet [id number] No Take 1 tablet (40 mg total) by mouth 2 (two) times daily before a meal.  Patient not taking:  Reported on 08/26/2018   Edward Dike, Edward [name] Not Taking Active Multiple Informants           Med Note Edward [name], Marissa Nestle   Fri Aug 02, 2018 12:28 PM) No pharmacy record   polyethylene glycol [facility] / GLYCOLAX) packet [id number] No Take 17 g by mouth daily.  Patient taking differently:  Take 17 g by mouth daily as needed for mild constipation or moderate constipation.    Edward Dike, Edward [name] Taking Active Multiple Informants  potassium chloride SA (K-DUR,KLOR-CON) 20 MEQ tablet [id number] No Take 1 tablet (20 mEq total) by mouth every other day. When he takes lasix  Patient taking differently:  Take 20 mEq by mouth daily. When he takes lasix    Edward Sheldon, Edward [name] Taking Active Multiple Informants           Med Note Edward [name], Edward [name]   Fri Aug 02, 2018 12:28 PM) LF 10.30.19  pravastatin (PRAVACHOL) 80 MG tablet [id number] No Take 1 tablet (80 mg total) by mouth at bedtime. Edward Sheldon, Edward [name] Taking Active Multiple Informants           Med Note Edward [name], Marissa Nestle   Fri Aug 02, 2018 12:29 PM) Lf 10.30.19  Med List Note [name] 09/22/17 2323): Patient states that his pharmacy is [place]         Patient has an appointment with Edward [name] on 12/9.    I have spoken with Edward [name] son, Edward [name] to relay the information about his medications with emphasis on his insulin regime.   He verbalized understanding and said he would convey message to his father.  Plan: Route note to Edward [name] to ensure he  is aware patient was taking his insulin incorrectly for ~ 3 weeks.   Follow up with Edward [name] after his PCP visit.  Edward [name], PharmD Clinical Pharmacist [place] [phone]

## 2018-08-28 NOTE — Telephone Encounter (Signed)
[facility] Nurse [name] had questions about pt's medications as she is going out to their house to try an coordinate care of meds. Consulted with Delsa Grana PA-C in regards to what he is actually supposed to be taking as he is non-compliant and is not using the medications incorrectly.  Per Kristeen Miss - COPD- Trelegy once daily, Albuterol nebs. DC Atrovent and Pulmicort  Afib - Eliquis, Metoprolol and Diltiazem Losartan to remain dc's until f/u CMP CHF - Furosemide 40mg  daily DM - Basaglar 20units bid and dc Humalog  Med list update and [name] made aware.

## 2018-09-01 DIAGNOSIS — J449 Chronic obstructive pulmonary disease, unspecified: Secondary | ICD-10-CM | POA: Diagnosis not present

## 2018-09-02 ENCOUNTER — Ambulatory Visit: Payer: Medicare Other

## 2018-09-04 ENCOUNTER — Encounter: Payer: Self-pay | Admitting: Family Medicine

## 2018-09-09 ENCOUNTER — Ambulatory Visit (INDEPENDENT_AMBULATORY_CARE_PROVIDER_SITE_OTHER): Payer: Medicare Other | Admitting: Family Medicine

## 2018-09-09 ENCOUNTER — Other Ambulatory Visit: Payer: Self-pay | Admitting: *Deleted

## 2018-09-09 ENCOUNTER — Encounter: Payer: Self-pay | Admitting: Family Medicine

## 2018-09-09 ENCOUNTER — Encounter: Payer: Self-pay | Admitting: *Deleted

## 2018-09-09 VITALS — BP 140/60 | HR 70 | Temp 97.8°F | Resp 22 | Ht 65.0 in | Wt 200.0 lb

## 2018-09-09 DIAGNOSIS — E118 Type 2 diabetes mellitus with unspecified complications: Secondary | ICD-10-CM

## 2018-09-09 DIAGNOSIS — Z794 Long term (current) use of insulin: Secondary | ICD-10-CM | POA: Diagnosis not present

## 2018-09-09 DIAGNOSIS — L03116 Cellulitis of left lower limb: Secondary | ICD-10-CM

## 2018-09-09 DIAGNOSIS — N183 Chronic kidney disease, stage 3 unspecified: Secondary | ICD-10-CM

## 2018-09-09 DIAGNOSIS — J449 Chronic obstructive pulmonary disease, unspecified: Secondary | ICD-10-CM | POA: Diagnosis not present

## 2018-09-09 DIAGNOSIS — R600 Localized edema: Secondary | ICD-10-CM | POA: Diagnosis not present

## 2018-09-09 DIAGNOSIS — I5032 Chronic diastolic (congestive) heart failure: Secondary | ICD-10-CM | POA: Diagnosis not present

## 2018-09-09 MED ORDER — METOPROLOL TARTRATE 25 MG PO TABS: 25.0000 mg | ORAL_TABLET | Freq: Two times a day (BID) | ORAL | 3 refills | Status: DC

## 2018-09-09 MED ORDER — PRAVASTATIN SODIUM 80 MG PO TABS: 80.0000 mg | ORAL_TABLET | Freq: Every day | ORAL | 3 refills | Status: DC

## 2018-09-09 NOTE — Patient Outreach (Addendum)
Outreach to pt for telephonic assessment, spoke with pt, HIPAA verified, pt reports "swelling in my legs gone down"  Continues using oxygen 3 liters/ 24 hours daily.  States " breathing good" and reports having all medication and taking as prescribed. Pt refuses to weigh, pt states he is to see Dr. Dennard Schaumann today.  RN CM reviewed plan of care with pt and will continue monthly telephonic outreach for reinforcement. [facility] pharmacist involved for medication management/ adherence.  THN CM Care Plan Problem One     Most Recent Value  Care Plan Problem One  Knowledge deficit related to diabetes  Role Documenting the Problem One  Care Management Coordinator  Care Plan for Problem One  Active  THN Long Term Goal   Pt will show improvement in blood sugar (100-200's range) within 60 days  THN Long Term Goal Start Date  08/13/18  Interventions for Problem One Long Term Goal  RN CM reviewed CBG log with what readings pt has recorded, pt is not consistent with recording, ranges 120-140's, pt feels definitely an improvement  THN CM Short Term Goal #1   Pt will verbalize examples of foods that are carbohydrates within 30 days  THN CM Short Term Goal #1 Start Date  08/13/18  [facility] CM Short Term Goal #1 Met Date  09/09/18  Interventions for Short Term Goal #1  RN CM reviewed foods high in carbohydrates to limit and affect on blood sugar  THN CM Short Term Goal #2   Pt will consistently check CBG daily and record in [facility] calendar within 30 days  THN CM Short Term Goal #2 Start Date  09/09/18 [goal restarted]  Interventions for Short Term Goal #2  RN CM ask pt to be consistent with recording CBG daily, pt states he is checking CBG daily but not always recording    [facility] CM Care Plan Problem Two     Most Recent Value  Care Plan Problem Two  Knowledge deficit related to COPD  Role Documenting the Problem Two  Care Management Coordinator  Care Plan for Problem Two  Active  THN CM Short Term Goal #1   Pt will verbalize COPD  action plan within 30 days  THN CM Short Term Goal #1 Start Date  09/09/18 Barrie Folk restarted]  Interventions for Short Term Goal #2   RN CM reinforced COPD action plan, pt stil refuses to take flu vaccine, RN CM reviewed avoiding sick people, good handwashing.      PLAN Outreach pt next month for telephonic assessment  Jacqlyn Larsen [facility] [place] Coordinator [phone]

## 2018-09-09 NOTE — Progress Notes (Signed)
Subjective:    Patient ID: Edward [name], male    DOB: 1934/11/07, 83 y.o.   MRN: [id number]  HPI  08/19/18 Patient is an 83 year old Caucasian male here today for shortness of breath and leg swelling.  This is his first encounter.  He has a history of chronic diastolic congestive heart failure currently taking Lasix every other day.  He reports swelling in both legs to his knees.  He has tense +1 pitting edema in both legs to the level of his knee.  The skin overlying both shins is erythematous indurated and firm.  He also reports increasing dyspnea on exertion and increasing shortness of breath.  His weight is up approximately 16 pounds since September suggesting fluid overload.  However on pulmonary exam, he has diminished breath sounds bilaterally with diffuse expiratory wheezing and rhonchorous breath sounds.  He has underlying COPD.  However he is unable to tell me what medication he is taking.  His medicine list includes albuterol as needed ipratropium as needed and Pulmicort.  However it sounds like he is only using albuterol as needed.  He is on no maintenance medication.  He is still smoking.  He is coughing and wheezing throughout our encounter today.  He denies any chest pain.  He denies any pleurisy.  He denies any hemoptysis.  He denies any fever.  At that time, my plan was: I believe this is a combination of poorly controlled COPD, chronic respiratory failure with hypoxia, pulmonary edema and fluid overload due to chronic diastolic heart failure.  I recommended starting Trelegy 1 inhalation a day to better manage his bronchospasm and control his COPD.  I also recommended increasing diuresis by starting Lasix 40 mg twice daily and potassium/K. Dur 20 mEq once daily for the remainder of this week and then recheck on Friday to evaluate his diuresis.  I will check a BMP today and then recheck another BMP at his follow-up appointment on Friday.  Obtain a baseline BNP today.    08/23/18 Patient  is here today for follow-up.  He is very concerned because the Trelegy was expensive.  However he does state his breathing has improved on Trelegy.  He has lost 5 pounds since his last office visit.  However he continues to have tense pitting edema in both legs distal to the knee.  The skin over the left leg is erythematous warm and tender to the touch from his mid shin up to the level of his knee as diagrammed below.  At that time, my plan was: Edema has improved.  I will decrease Lasix from 40 mg twice daily to 40 mg once daily.  I will check a CMP to monitor his kidney function and his potassium to avoid prerenal azotemia.  Recheck the patient in 2 weeks.  Meanwhile treat cellulitis with Keflex 500 mg p.o. 3 times daily for 7 days.  Continue Trelegy for his emphysema  09/09/18 On the patient's last lab work, his albumin and protein were both low.  We have recommended adding Ensure for protein calorie malnutrition.  He is here today for follow-up.  He states that his breathing is at his baseline.  He is 99% on oxygen.  He denies any chest pain or shortness of breath beyond normal.  His weight has maintained at 200 pounds down from 206 originally.  The edema has improved in his legs and is now trace at best.  Furthermore the erythema in his left leg is completely resolved proving resolution  of his cellulitis.  He brings in paperwork today with his blood sugars recorded.  His last blood sugar/hemoglobin A1c was checked in this clinic in September.  At that time it was 6.9 fasting blood sugars in the morning are extremely high.  They range from 120-435.  The majority are between 225 and 330.  His afternoon blood sugars range between 95 and 205 but the vast majority are between 101 180.  His evening sugars are between 101 137.  Therefore he has hyperglycemia primarily in the morning with the remainder of his is relatively well controlled.  He is currently on Lantus 20 units twice daily. Past Medical History:    Diagnosis Date  . Allergy    Rhinitis  . Atrial fibrillation ([place])   . Bronchitis   . Chronic respiratory failure ([place])   . Colon polyps   . COPD (chronic obstructive pulmonary disease) ([place])   . Diabetes mellitus   . Elevated lipids   . Hypercholesterolemia   . Hypertension   . Iron deficiency anemia due to chronic blood loss 04/30/2018  . Noncompliance   . On home O2    2L N/C   . PSA elevation   . Pulmonary fibrosis ([place])   . Vitamin D deficiency    Past Surgical History:  Procedure Laterality Date  . BIOPSY  04/23/2018   Procedure: BIOPSY;  Surgeon: Danie Binder, MD;  Location: [facility];  Service: Endoscopy;;  duodenum gastric  . CATARACT EXTRACTION W/PHACO  06/25/2012   Procedure: CATARACT EXTRACTION PHACO AND INTRAOCULAR LENS PLACEMENT (IOC);  Surgeon: Elta Guadeloupe T. Gershon Crane, MD;  Location: [facility];  Service: Ophthalmology;  Laterality: Left;  CDE=19.01  . CATARACT EXTRACTION W/PHACO  07/09/2012   Procedure: CATARACT EXTRACTION PHACO AND INTRAOCULAR LENS PLACEMENT (IOC);  Surgeon: Elta Guadeloupe T. Gershon Crane, MD;  Location: [facility];  Service: Ophthalmology;  Laterality: Right;  CDE: 20.09  . COLONOSCOPY WITH PROPOFOL N/A 04/23/2018   Procedure: COLONOSCOPY WITH PROPOFOL;  Surgeon: Danie Binder, MD;  Location: [facility];  Service: Endoscopy;  Laterality: N/A;  . ESOPHAGOGASTRODUODENOSCOPY (EGD) WITH PROPOFOL N/A 04/23/2018   Procedure: ESOPHAGOGASTRODUODENOSCOPY (EGD) WITH PROPOFOL;  Surgeon: Danie Binder, MD;  Location: [facility];  Service: Endoscopy;  Laterality: N/A;   Current Outpatient Medications on File Prior to Visit  Medication Sig Dispense Refill  . albuterol (PROVENTIL) (2.5 MG/3ML) 0.083% nebulizer solution INHALE 1 VIAL VIA NEBULIZER EVERY 6 HOURS AS NEEDED FOR WHEEZING OR SHORTNESS OF BREATH (Patient taking differently: Take 2.5 mg by nebulization every 6 (six) hours as needed for wheezing or shortness of breath. ) 360 mL 4  . apixaban (ELIQUIS) 2.5 MG TABS tablet  Take 1 tablet (2.5 mg total) by mouth 2 (two) times daily. Restart on 7/24 120 tablet 0  . aspirin EC 81 MG tablet Take 1 tablet (81 mg total) by mouth daily. Resume on 7/24    . cloNIDine (CATAPRES) 0.1 MG tablet TAKE 1 TABLET BY MOUTH 2 TIMES A DAY (Patient taking differently: Take 0.1 mg by mouth 2 (two) times daily. ) 60 tablet 2  . diltiazem (CARDIZEM CD) 240 MG 24 hr capsule Take 1 capsule (240 mg total) by mouth daily. 30 capsule 2  . Fluticasone-Umeclidin-Vilant (TRELEGY ELLIPTA) 100-62.5-25 MCG/INH AEPB Inhale 1 Inhaler into the lungs daily. 1 each 5  . furosemide (LASIX) 40 MG tablet Take 1 tablet (40 mg total) by mouth every other day. Takes with Potassium 20MEQ. First dose of Lasix 11/6. (Patient taking  differently: Take 40 mg by mouth daily. Takes with Potassium 20MEQ. First dose of Lasix 11/6.)    . hydrocortisone (ANUSOL-HC) 25 MG suppository Place 1 suppository (25 mg total) rectally 2 (two) times daily as needed for hemorrhoids or anal itching.    . Insulin Glargine (BASAGLAR KWIKPEN) 100 UNIT/ML SOPN Inject 20 Units into the skin 2 (two) times daily.    . ONE TOUCH ULTRA TEST test strip     . ONETOUCH DELICA LANCETS 17E MISC USE TO CHECK BLOOD SUGAR TWICE DAILY AS DIRECTED 100 each 2  . OXYGEN Inhale 3 L into the lungs continuous.     . pantoprazole (PROTONIX) 40 MG tablet Take 1 tablet (40 mg total) by mouth 2 (two) times daily before a meal. 60 tablet 1  . polyethylene glycol (MIRALAX / GLYCOLAX) packet Take 17 g by mouth daily. (Patient taking differently: Take 17 g by mouth daily as needed for mild constipation or moderate constipation. ) 14 each 0  . potassium chloride SA (K-DUR,KLOR-CON) 20 MEQ tablet Take 1 tablet (20 mEq total) by mouth every other day. When he takes lasix (Patient taking differently: Take 20 mEq by mouth daily. When he takes lasix ) 90 tablet 0   No current facility-administered medications on file prior to visit.    Allergies  Allergen Reactions  . Ace  Inhibitors Other (See Comments)    Hyperkalemia--07/23/2013:patient states not familiar with the following allergy   Social History   Socioeconomic History  . Marital status: Married    Spouse name: Not on file  . Number of children: Not on file  . Years of education: Not on file  . Highest education level: Not on file  Occupational History  . 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 Financial resource strain: Not on file  . Food insecurity:    Worry: Not on file    Inability: Not on file  . Transportation needs:    Medical: Not on file    Non-medical: Not on file  Tobacco Use  . Smoking status: Former Smoker    Packs/day: 1.50    Years: 60.00    Pack years: 90.00    Types: Cigarettes    Last attempt to quit: 12/31/2012    Years since quitting: 5.6  . Smokeless tobacco: Never Used  Substance and Sexual Activity  . Alcohol use: No  . Drug use: No  . Sexual activity: Yes    Birth control/protection: None  Lifestyle  . Physical activity:    Days per week: Not on file    Minutes per session: Not on file  . Stress: Not on file  Relationships  . Social connections:    Talks on phone: Not on file    Gets together: Not on file    Attends religious service: Not on file    Active member of club or organization: Not on file    Attends meetings of clubs or organizations: Not on file    Relationship status: Not on file  . Intimate partner violence:    Fear of current or ex partner: Not on file    Emotionally abused: Not on file    Physically abused: Not on file    Forced sexual activity: Not on file  Other Topics Concern  . Not on file  Social History Narrative  . Not on file      Review of Systems  All other systems reviewed and are negative.  Objective:   Physical Exam  Constitutional: He appears well-developed and well-nourished. No distress.  Neck: No JVD present.  Cardiovascular: Normal rate, regular rhythm and normal heart  sounds.  Pulmonary/Chest: Effort normal. No accessory muscle usage or stridor. No respiratory distress. He has no decreased breath sounds. He has wheezes. He has no rhonchi.  Abdominal: Soft. Bowel sounds are normal. He exhibits no distension and no mass. There is no tenderness. There is no rebound and no guarding.  Musculoskeletal: He exhibits no edema.  Skin: He is not diaphoretic. No erythema.  Vitals reviewed.         Assessment & Plan:  Chronic obstructive pulmonary disease, unspecified COPD type ([place]) - Plan: CBC with Differential/Platelet, BASIC METABOLIC PANEL WITH GFR  Bilateral lower extremity edema - Plan: CBC with Differential/Platelet, BASIC METABOLIC PANEL WITH GFR  CKD (chronic kidney disease), stage III ([place]) - Plan: CBC with Differential/Platelet, BASIC METABOLIC PANEL WITH GFR  Cellulitis of left leg  Chronic diastolic heart failure ([name])  Controlled type 2 diabetes mellitus with complication, with long-term current use of insulin ([place]) - Plan: Hemoglobin A1c  From a standpoint of his COPD, his wheezing has improved.  He seems to be doing better on Trelegy and I recommended that he continue this.  His rhonchi have disappeared.  I believe he is currently at his baseline.  Regarding his chronic diastolic heart failure and bilateral lower extremity edema, he is currently doing much better taking Lasix 40 mg a day.  I recommended that he continue this indefinitely.  I will monitor his kidney function and potassium again today.  Regarding the cellulitis in his left leg this is resolved on Keflex.  No further antibiotics are required.  Regarding his Be[place] mellitus type 2 insulin-dependent, I am concerned about his hyperglycemia and first thing in the morning.  His afternoon and evening sugars however are much better and are acceptable given his medical comorbidities.  I would recommend checking a globin A1c today.  If elevated, I would recommend starting a fast acting insulin  in the morning prior to breakfast and then continuing his current dose of Lantus.  We could also consider instead starting back on metformin however his chronic kidney disease makes this precarious.

## 2018-09-09 NOTE — Patient Outreach (Signed)
[facility]) Care Management   Entry for Initial Home visit on 05/23/2018.   (Unable to retrieve Review of Systems (ROS) and Physical Assessment)  Edward [name] 03-11-1935 [id number]   VITALS BP (120/62) Pulse (89) Respirations (18) O2 sat (94%)  Encounter Medications:   Outpatient Encounter Medications as of 05/23/2018  Medication Sig Note  . apixaban (ELIQUIS) 2.5 MG TABS tablet Take 1 tablet (2.5 mg total) by mouth 2 (two) times daily. Restart on 7/24 08/02/2018: No record found  . aspirin EC 81 MG tablet Take 1 tablet (81 mg total) by mouth daily. Resume on 7/24   . Insulin Glargine (BASAGLAR KWIKPEN) 100 UNIT/ML SOPN Inject 20 Units into the skin 2 (two) times daily.   Edward [name] DELICA LANCETS 24M MISC USE TO CHECK BLOOD SUGAR TWICE DAILY AS DIRECTED   . OXYGEN Inhale 3 L into the lungs continuous.    . pantoprazole (PROTONIX) 40 MG tablet Take 1 tablet (40 mg total) by mouth 2 (two) times daily before a meal. (Patient not taking: Reported on 08/26/2018) 08/02/2018: No pharmacy record   . polyethylene glycol (MIRALAX / GLYCOLAX) packet Take 17 g by mouth daily. (Patient taking differently: Take 17 g by mouth daily as needed for mild constipation or moderate constipation. )   . potassium chloride SA (K-DUR,KLOR-CON) 20 MEQ tablet Take 1 tablet (20 mEq total) by mouth every other day. When he takes lasix (Patient taking differently: Take 20 mEq by mouth daily. When he takes lasix ) 08/02/2018: LF 10.30.19  . pravastatin (PRAVACHOL) 80 MG tablet Take 1 tablet (80 mg total) by mouth at bedtime. 08/02/2018: Lf 10.30.19  . [DISCONTINUED] albuterol (PROVENTIL) (2.5 MG/3ML) 0.083% nebulizer solution    . [DISCONTINUED] budesonide (PULMICORT) 0.5 MG/2ML nebulizer solution Take 2 mLs (0.5 mg total) by nebulization 2 (two) times daily. (Patient not taking: Reported on 08/26/2018) 08/02/2018: No current pharmacy record  . [DISCONTINUED] cloNIDine (CATAPRES) 0.1 MG tablet TAKE 1 TABLET  BY MOUTH 2 TIMES A DAY   . [DISCONTINUED] diltiazem (CARDIZEM CD) 240 MG 24 hr capsule TAKE 1 CAPSULE BY MOUTH DAILY   . [DISCONTINUED] furosemide (LASIX) 40 MG tablet Take 1 tablet (40 mg total) by mouth daily. (Patient taking differently: Take 40 mg by mouth every other day. Takes with Potassium 20MEQ) 08/02/2018: Lf 10.30.19   . [DISCONTINUED] hydrocortisone (ANUSOL-HC) 25 MG suppository Place 1 suppository (25 mg total) rectally every 12 (twelve) hours. (Patient taking differently: Place 25 mg rectally 2 (two) times daily as needed for hemorrhoids or anal itching. )   . [DISCONTINUED] insulin lispro (HUMALOG) 100 UNIT/ML KwikPen Junior Inject 0.08 mLs (8 Units total) into the skin 3 (three) times daily. (Patient not taking: Reported on 08/09/2018)   . [DISCONTINUED] ipratropium (ATROVENT) 0.02 % nebulizer solution Take 2.5 mLs (0.5 mg total) by nebulization 4 (four) times daily.   . [DISCONTINUED] losartan (COZAAR) 100 MG tablet TAKE 1 TABLET BY MOUTH DAILY   . [DISCONTINUED] metoprolol tartrate (LOPRESSOR) 25 MG tablet TAKE 1 TABLET BY MOUTH TWICE DAILY    No facility-administered encounter medications on file as of 05/23/2018.      Assessment:   Home visit complete. Mr. Edward [name] reported feeling "very good" at RNCM's arrival. Denied shortness of breath at rest but experiences shortness of breath with exertion. He requires 02 at 3L/min. Stated he does not always use it as recommended. Reported compliance with medications. Stated that his wife is available to assist with medication preparation. Does not monitor his  blood glucose consistently and does not perform daily weights. Reported fasting blood glucose of 100. Reported ambulating well and independently performing ADLs. Denied worsening symptoms since discharge.  Discussed [facility] services and care management needs. Mr. Edward [name] lives in the home with his wife and son. Stated he has received the needed medication assistance from [facility]. He was  very appreciated of the services and stated that he would contact [facility] Pharmacist, Edward [name] if additional assistance was needed. He was not interested in participating in [employer], declined need for nutritional assistance and does not require assistance with transportation. Declined need for [facility] referral. Reported Home Health services were offered prior to discharge but declined due to feeling that services were not required.  Mr. Edward [name] did not feel that [place] [facility] services were needed but agreed to reconsider. Agreeable to one more home visit to reassess. Member unsure of availability but requested follow up call to schedule visit. Contact information provided. Member encouraged to contact RNCM if needed prior to next month's visit.   PLAN Will follow up to reassess on next month.  [place] [phone]

## 2018-09-10 ENCOUNTER — Other Ambulatory Visit: Payer: Self-pay | Admitting: Pharmacy Technician

## 2018-09-10 ENCOUNTER — Other Ambulatory Visit: Payer: Self-pay

## 2018-09-10 ENCOUNTER — Ambulatory Visit: Payer: Self-pay

## 2018-09-10 LAB — CBC WITH DIFFERENTIAL/PLATELET
BASOS ABS: 81 {cells}/uL (ref 0–200)
Basophils Relative: 1 %
Eosinophils Absolute: 421 cells/uL (ref 15–500)
Eosinophils Relative: 5.2 %
HEMATOCRIT: 29.9 % — AB (ref 38.5–50.0)
Hemoglobin: 10.3 g/dL — ABNORMAL LOW (ref 13.2–17.1)
LYMPHS ABS: 1353 {cells}/uL (ref 850–3900)
MCH: 29.7 pg (ref 27.0–33.0)
MCHC: 34.4 g/dL (ref 32.0–36.0)
MCV: 86.2 fL (ref 80.0–100.0)
MPV: 10.3 fL (ref 7.5–12.5)
Monocytes Relative: 13.5 %
NEUTROS PCT: 63.6 %
Neutro Abs: 5152 cells/uL (ref 1500–7800)
Platelets: 284 10*3/uL (ref 140–400)
RBC: 3.47 10*6/uL — ABNORMAL LOW (ref 4.20–5.80)
RDW: 12.7 % (ref 11.0–15.0)
Total Lymphocyte: 16.7 %
WBC: 8.1 10*3/uL (ref 3.8–10.8)
WBCMIX: 1094 {cells}/uL — AB (ref 200–950)

## 2018-09-10 LAB — BASIC METABOLIC PANEL WITH GFR
BUN / CREAT RATIO: 16 (calc) (ref 6–22)
BUN: 19 mg/dL (ref 7–25)
CHLORIDE: 88 mmol/L — AB (ref 98–110)
CO2: 29 mmol/L (ref 20–32)
Calcium: 8.3 mg/dL — ABNORMAL LOW (ref 8.6–10.3)
Creat: 1.22 mg/dL — ABNORMAL HIGH (ref 0.70–1.11)
GFR, EST AFRICAN AMERICAN: 64 mL/min/{1.73_m2} (ref >=60)
GFR, Est Non African American: 55 mL/min/{1.73_m2} — ABNORMAL LOW (ref >=60)
GLUCOSE: 134 mg/dL — AB (ref 65–99)
POTASSIUM: 4.9 mmol/L (ref 3.5–5.3)
Sodium: 125 mmol/L — ABNORMAL LOW (ref 135–146)

## 2018-09-10 LAB — HEMOGLOBIN A1C
HEMOGLOBIN A1C: 8.1 %{Hb} — AB (ref <5.7)
Mean Plasma Glucose: 186 (calc)
eAG (mmol/L): 10.3 (calc)

## 2018-09-10 NOTE — Patient Outreach (Signed)
[facility]) Care Management  09/10/2018  Edward [name] 10-Dec-1934 [id number]                                                  Medication Assistance Referral  Referral From: [place]  Medication/Company: [employer] / [employer] Patient application portion:  Mail  along with Letter of [place] Provider application portion: Faxed  to Dr. Dennard Schaumann   Follow up:  Will follow up with patient in 5-7 business days to confirm application(s) have been received.  [name] [place] Certified Pharmacy Technician [place] Management Direct Dial:[phone]

## 2018-09-10 NOTE — Patient Outreach (Signed)
[facility]) Care Management  09/10/2018  Edward [name] 10-06-1934 [id number]  Successful outreach call to Mr. [place] son, [name] who lives with patient (Mr. Baley was sleeping.)  HIPAA identifiers verified.  Son was able to tell me that his dad is taking Basaglar 20 units twice daily as prescribed.  He resumed this medication ~ 08/28/18.  Patient had erroneous been using Humalog 20 units twice daily since hospital discharge.  Per note from PCP visit yesterday, his HgA1c is up to 8.1%  In-basket message sent to Dr. Dennard Schaumann to make him aware of the insulin mix up.   Patient Assistance: Will begin work on 2020 patient assistance application for [employer] made by [employer] and prescribed by Dr. Jenna Luo.  Son is aware that he will be receiving an application to help his father fill out.  Plan: Route letter to [place], Etter Sjogren.  [name], PharmD Clinical Pharmacist [place] [phone]

## 2018-09-12 ENCOUNTER — Other Ambulatory Visit: Payer: Self-pay | Admitting: Family Medicine

## 2018-09-12 DIAGNOSIS — Z794 Long term (current) use of insulin: Secondary | ICD-10-CM

## 2018-09-12 DIAGNOSIS — E871 Hypo-osmolality and hyponatremia: Secondary | ICD-10-CM

## 2018-09-12 DIAGNOSIS — E118 Type 2 diabetes mellitus with unspecified complications: Secondary | ICD-10-CM

## 2018-09-18 ENCOUNTER — Ambulatory Visit: Payer: Medicare Other | Admitting: Physician Assistant

## 2018-09-19 ENCOUNTER — Ambulatory Visit: Payer: Medicare Other | Admitting: Family Medicine

## 2018-09-23 ENCOUNTER — Other Ambulatory Visit: Payer: Self-pay | Admitting: Pharmacy Technician

## 2018-09-23 NOTE — Patient Outreach (Signed)
[facility]) Care Management  09/23/2018  Edward [name] [birth year] [id number]   Received patient portion of [place] patient assistance application for [employer]. Faxed completed application and required documents into company for 2020 re-enrollment.  Will follow up with company in 7-10 business days.  [name] [name] Certified Pharmacy Technician [place] Management Direct Dial:[phone]

## 2018-10-01 ENCOUNTER — Other Ambulatory Visit: Payer: Self-pay | Admitting: Family Medicine

## 2018-10-02 DIAGNOSIS — J449 Chronic obstructive pulmonary disease, unspecified: Secondary | ICD-10-CM | POA: Diagnosis not present

## 2018-10-04 ENCOUNTER — Encounter (HOSPITAL_COMMUNITY): Payer: Self-pay | Admitting: Emergency Medicine

## 2018-10-04 ENCOUNTER — Observation Stay (HOSPITAL_COMMUNITY)
Admission: EM | Admit: 2018-10-04 | Discharge: 2018-10-06 | Disposition: A | Discharge status: Home / Self Care | Payer: Medicare Other | Attending: Family Medicine | Admitting: Family Medicine

## 2018-10-04 ENCOUNTER — Emergency Department (HOSPITAL_COMMUNITY): Payer: Medicare Other

## 2018-10-04 ENCOUNTER — Telehealth: Payer: Self-pay | Admitting: Family Medicine

## 2018-10-04 ENCOUNTER — Other Ambulatory Visit: Payer: Self-pay

## 2018-10-04 DIAGNOSIS — I130 Hypertensive heart and chronic kidney disease with heart failure and stage 1 through stage 4 chronic kidney disease, or unspecified chronic kidney disease: Secondary | ICD-10-CM | POA: Diagnosis not present

## 2018-10-04 DIAGNOSIS — J441 Chronic obstructive pulmonary disease with (acute) exacerbation: Principal | ICD-10-CM | POA: Insufficient documentation

## 2018-10-04 DIAGNOSIS — E7800 Pure hypercholesterolemia, unspecified: Secondary | ICD-10-CM | POA: Insufficient documentation

## 2018-10-04 DIAGNOSIS — E871 Hypo-osmolality and hyponatremia: Secondary | ICD-10-CM | POA: Diagnosis not present

## 2018-10-04 DIAGNOSIS — I10 Essential (primary) hypertension: Secondary | ICD-10-CM | POA: Diagnosis not present

## 2018-10-04 DIAGNOSIS — R0902 Hypoxemia: Secondary | ICD-10-CM

## 2018-10-04 DIAGNOSIS — Z09 Encounter for follow-up examination after completed treatment for conditions other than malignant neoplasm: Secondary | ICD-10-CM

## 2018-10-04 DIAGNOSIS — I480 Paroxysmal atrial fibrillation: Secondary | ICD-10-CM | POA: Diagnosis not present

## 2018-10-04 DIAGNOSIS — E1165 Type 2 diabetes mellitus with hyperglycemia: Secondary | ICD-10-CM | POA: Diagnosis present

## 2018-10-04 DIAGNOSIS — N183 Chronic kidney disease, stage 3 unspecified: Secondary | ICD-10-CM | POA: Diagnosis present

## 2018-10-04 DIAGNOSIS — I5032 Chronic diastolic (congestive) heart failure: Secondary | ICD-10-CM | POA: Diagnosis not present

## 2018-10-04 DIAGNOSIS — IMO0002 Reserved for concepts with insufficient information to code with codable children: Secondary | ICD-10-CM | POA: Diagnosis present

## 2018-10-04 DIAGNOSIS — D500 Iron deficiency anemia secondary to blood loss (chronic): Secondary | ICD-10-CM | POA: Diagnosis not present

## 2018-10-04 DIAGNOSIS — Z87891 Personal history of nicotine dependence: Secondary | ICD-10-CM | POA: Diagnosis not present

## 2018-10-04 DIAGNOSIS — R600 Localized edema: Secondary | ICD-10-CM

## 2018-10-04 DIAGNOSIS — R0602 Shortness of breath: Secondary | ICD-10-CM | POA: Diagnosis present

## 2018-10-04 DIAGNOSIS — E119 Type 2 diabetes mellitus without complications: Secondary | ICD-10-CM | POA: Insufficient documentation

## 2018-10-04 DIAGNOSIS — J439 Emphysema, unspecified: Secondary | ICD-10-CM | POA: Diagnosis not present

## 2018-10-04 LAB — BRAIN NATRIURETIC PEPTIDE: B NATRIURETIC PEPTIDE 5: 71 pg/mL (ref 0.0–100.0)

## 2018-10-04 LAB — CBC WITH DIFFERENTIAL/PLATELET
Abs Immature Granulocytes: 0.18 10*3/uL — ABNORMAL HIGH (ref 0.00–0.07)
BASOS ABS: 0.1 10*3/uL (ref 0.0–0.1)
BASOS PCT: 1 %
EOS ABS: 2.1 10*3/uL — AB (ref 0.0–0.5)
EOS PCT: 19 %
HCT: 35.6 % — ABNORMAL LOW (ref 39.0–52.0)
Hemoglobin: 11 g/dL — ABNORMAL LOW (ref 13.0–17.0)
Immature Granulocytes: 2 %
LYMPHS ABS: 1.7 10*3/uL (ref 0.7–4.0)
Lymphocytes Relative: 16 %
MCH: 27.6 pg (ref 26.0–34.0)
MCHC: 30.9 g/dL (ref 30.0–36.0)
MCV: 89.4 fL (ref 80.0–100.0)
Monocytes Absolute: 0.9 10*3/uL (ref 0.1–1.0)
Monocytes Relative: 8 %
NRBC: 0 % (ref 0.0–0.2)
Neutro Abs: 6 10*3/uL (ref 1.7–7.7)
Neutrophils Relative %: 54 %
PLATELETS: 486 10*3/uL — AB (ref 150–400)
RBC: 3.98 MIL/uL — AB (ref 4.22–5.81)
RDW: 13.1 % (ref 11.5–15.5)
WBC: 11 10*3/uL — AB (ref 4.0–10.5)

## 2018-10-04 LAB — COMPREHENSIVE METABOLIC PANEL
ALK PHOS: 155 U/L — AB (ref 38–126)
ALT: 12 U/L (ref 0–44)
AST: 17 U/L (ref 15–41)
Albumin: 2.8 g/dL — ABNORMAL LOW (ref 3.5–5.0)
Anion gap: 11 (ref 5–15)
BILIRUBIN TOTAL: 0.4 mg/dL (ref 0.3–1.2)
BUN: 31 mg/dL — ABNORMAL HIGH (ref 8–23)
CALCIUM: 8.7 mg/dL — AB (ref 8.9–10.3)
CO2: 30 mmol/L (ref 22–32)
Chloride: 88 mmol/L — ABNORMAL LOW (ref 98–111)
Creatinine, Ser: 1.23 mg/dL (ref 0.61–1.24)
GFR calc non Af Amer: 55 mL/min — ABNORMAL LOW (ref >60)
Glucose, Bld: 247 mg/dL — ABNORMAL HIGH (ref 70–99)
POTASSIUM: 4.3 mmol/L (ref 3.5–5.1)
Sodium: 129 mmol/L — ABNORMAL LOW (ref 135–145)
TOTAL PROTEIN: 6.9 g/dL (ref 6.5–8.1)

## 2018-10-04 LAB — I-STAT TROPONIN, ED: Troponin i, poc: 0 ng/mL (ref 0.00–0.08)

## 2018-10-04 LAB — CBG MONITORING, ED: Glucose-Capillary: 210 mg/dL — ABNORMAL HIGH (ref 70–99)

## 2018-10-04 LAB — INFLUENZA PANEL BY PCR (TYPE A & B)
Influenza A By PCR: NEGATIVE
Influenza B By PCR: NEGATIVE

## 2018-10-04 LAB — PHOSPHORUS: PHOSPHORUS: 5.2 mg/dL — AB (ref 2.5–4.6)

## 2018-10-04 LAB — MAGNESIUM: Magnesium: 2.3 mg/dL (ref 1.7–2.4)

## 2018-10-04 MED ORDER — MAGNESIUM SULFATE 2 GM/50ML IV SOLN
2.0000 g | Freq: Once | INTRAVENOUS | Status: AC
  Administered 2018-10-04: 2 g via INTRAVENOUS
  Filled 2018-10-04: qty 50

## 2018-10-04 MED ORDER — POTASSIUM CHLORIDE CRYS ER 20 MEQ PO TBCR
20.0000 meq | EXTENDED_RELEASE_TABLET | Freq: Every day | ORAL | Status: DC
  Administered 2018-10-05 – 2018-10-06 (×2): 20 meq via ORAL
  Filled 2018-10-04 (×2): qty 1

## 2018-10-04 MED ORDER — INSULIN ASPART 100 UNIT/ML ~~LOC~~ SOLN
0.0000 [IU] | Freq: Every day | SUBCUTANEOUS | Status: DC
  Administered 2018-10-04: 2 [IU] via SUBCUTANEOUS
  Filled 2018-10-04: qty 1

## 2018-10-04 MED ORDER — METOPROLOL TARTRATE 25 MG PO TABS
25.0000 mg | ORAL_TABLET | Freq: Two times a day (BID) | ORAL | Status: DC
  Administered 2018-10-05 – 2018-10-06 (×3): 25 mg via ORAL
  Filled 2018-10-04 (×3): qty 1

## 2018-10-04 MED ORDER — PANTOPRAZOLE SODIUM 40 MG PO TBEC
40.0000 mg | DELAYED_RELEASE_TABLET | Freq: Two times a day (BID) | ORAL | Status: DC
  Administered 2018-10-05 – 2018-10-06 (×3): 40 mg via ORAL
  Filled 2018-10-04 (×3): qty 1

## 2018-10-04 MED ORDER — ASPIRIN EC 81 MG PO TBEC
81.0000 mg | DELAYED_RELEASE_TABLET | Freq: Every day | ORAL | Status: DC
  Administered 2018-10-05 – 2018-10-06 (×2): 81 mg via ORAL
  Filled 2018-10-04 (×2): qty 1

## 2018-10-04 MED ORDER — ALBUTEROL SULFATE (2.5 MG/3ML) 0.083% IN NEBU: 2.5000 mg | INHALATION_SOLUTION | RESPIRATORY_TRACT | Status: DC | PRN

## 2018-10-04 MED ORDER — ONDANSETRON HCL 4 MG/2ML IJ SOLN: 4.0000 mg | Freq: Four times a day (QID) | INTRAMUSCULAR | Status: DC | PRN

## 2018-10-04 MED ORDER — PRAVASTATIN SODIUM 40 MG PO TABS
80.0000 mg | ORAL_TABLET | Freq: Every day | ORAL | Status: DC
  Administered 2018-10-05: 80 mg via ORAL
  Filled 2018-10-04: qty 1
  Filled 2018-10-04: qty 2
  Filled 2018-10-04 (×2): qty 1

## 2018-10-04 MED ORDER — CLONIDINE HCL 0.1 MG PO TABS
0.1000 mg | ORAL_TABLET | Freq: Two times a day (BID) | ORAL | Status: DC
  Administered 2018-10-05 – 2018-10-06 (×3): 0.1 mg via ORAL
  Filled 2018-10-04 (×3): qty 1

## 2018-10-04 MED ORDER — ACETAMINOPHEN 650 MG RE SUPP: 650.0000 mg | Freq: Four times a day (QID) | RECTAL | Status: DC | PRN

## 2018-10-04 MED ORDER — ACETAMINOPHEN 325 MG PO TABS: 650.0000 mg | ORAL_TABLET | Freq: Four times a day (QID) | ORAL | Status: DC | PRN

## 2018-10-04 MED ORDER — IPRATROPIUM-ALBUTEROL 0.5-2.5 (3) MG/3ML IN SOLN
3.0000 mL | Freq: Once | RESPIRATORY_TRACT | Status: AC
  Administered 2018-10-04: 3 mL via RESPIRATORY_TRACT
  Filled 2018-10-04: qty 3

## 2018-10-04 MED ORDER — HYDROCORTISONE ACETATE 25 MG RE SUPP: 25.0000 mg | Freq: Two times a day (BID) | RECTAL | Status: DC | PRN

## 2018-10-04 MED ORDER — FUROSEMIDE 40 MG PO TABS
40.0000 mg | ORAL_TABLET | Freq: Every day | ORAL | Status: DC
  Administered 2018-10-05: 40 mg via ORAL
  Filled 2018-10-04: qty 1

## 2018-10-04 MED ORDER — INSULIN GLARGINE 100 UNIT/ML ~~LOC~~ SOLN
20.0000 [IU] | Freq: Two times a day (BID) | SUBCUTANEOUS | Status: DC
  Administered 2018-10-05 (×2): 20 [IU] via SUBCUTANEOUS
  Filled 2018-10-04 (×6): qty 0.2

## 2018-10-04 MED ORDER — IPRATROPIUM-ALBUTEROL 0.5-2.5 (3) MG/3ML IN SOLN
RESPIRATORY_TRACT | Status: AC
  Filled 2018-10-04: qty 3

## 2018-10-04 MED ORDER — INSULIN ASPART 100 UNIT/ML ~~LOC~~ SOLN
0.0000 [IU] | Freq: Three times a day (TID) | SUBCUTANEOUS | Status: DC
  Administered 2018-10-05: 15 [IU] via SUBCUTANEOUS
  Administered 2018-10-05: 11 [IU] via SUBCUTANEOUS
  Filled 2018-10-04 (×2): qty 1

## 2018-10-04 MED ORDER — APIXABAN 2.5 MG PO TABS
2.5000 mg | ORAL_TABLET | Freq: Two times a day (BID) | ORAL | Status: DC
  Administered 2018-10-05 – 2018-10-06 (×3): 2.5 mg via ORAL
  Filled 2018-10-04 (×9): qty 1

## 2018-10-04 MED ORDER — ALBUTEROL SULFATE (2.5 MG/3ML) 0.083% IN NEBU
INHALATION_SOLUTION | RESPIRATORY_TRACT | Status: AC
  Administered 2018-10-04: 2.5 mg
  Filled 2018-10-04: qty 3

## 2018-10-04 MED ORDER — IPRATROPIUM BROMIDE 0.02 % IN SOLN: 0.5000 mg | Freq: Four times a day (QID) | RESPIRATORY_TRACT | Status: DC

## 2018-10-04 MED ORDER — METHYLPREDNISOLONE SODIUM SUCC 125 MG IJ SOLR
125.0000 mg | Freq: Once | INTRAMUSCULAR | Status: AC
  Administered 2018-10-04: 125 mg via INTRAVENOUS
  Filled 2018-10-04: qty 2

## 2018-10-04 MED ORDER — IPRATROPIUM-ALBUTEROL 0.5-2.5 (3) MG/3ML IN SOLN
3.0000 mL | Freq: Once | RESPIRATORY_TRACT | Status: AC
  Administered 2018-10-04: 3 mL via RESPIRATORY_TRACT

## 2018-10-04 MED ORDER — ONDANSETRON HCL 4 MG PO TABS: 4.0000 mg | ORAL_TABLET | Freq: Four times a day (QID) | ORAL | Status: DC | PRN

## 2018-10-04 MED ORDER — IPRATROPIUM-ALBUTEROL 0.5-2.5 (3) MG/3ML IN SOLN
3.0000 mL | Freq: Four times a day (QID) | RESPIRATORY_TRACT | Status: DC
  Administered 2018-10-05 – 2018-10-06 (×6): 3 mL via RESPIRATORY_TRACT
  Filled 2018-10-04 (×7): qty 3

## 2018-10-04 MED ORDER — ALBUTEROL SULFATE (2.5 MG/3ML) 0.083% IN NEBU: 2.5000 mg | INHALATION_SOLUTION | Freq: Four times a day (QID) | RESPIRATORY_TRACT | Status: DC

## 2018-10-04 MED ORDER — DILTIAZEM HCL ER COATED BEADS 240 MG PO CP24
240.0000 mg | ORAL_CAPSULE | Freq: Every day | ORAL | Status: DC
  Administered 2018-10-05 – 2018-10-06 (×2): 240 mg via ORAL
  Filled 2018-10-04 (×4): qty 1

## 2018-10-04 NOTE — H&P (Signed)
History and Physical    Edward [name]:[id number] DOB: 04-06-1935 DOA: 10/04/2018  PCP: Susy Frizzle, MD   Patient coming from: Home.  I have personally briefly reviewed patient's old medical records in [place]  Chief Complaint: SOB.  HPI: Edward [name] is a 84 y.o. male with medical history significant of allergy, PAF (atrial fibrillation), COPD, diabetes mellitus, hyperlipidemia, hypertension, iron deficiency anemia, vitamin D deficiency who is coming to the emergency department with complaints of progressively worse dyspnea with wheezing, fatigue, productive cough of whitish sputum since before Christmas.  He denies fever, chills, sore throat or rhinorrhea.  He denies hemoptysis, pleuritic chest pain, palpitations, dizziness, diaphoresis, PND, orthopnea.  He occasionally gets pitting edema of the lower extremities.  No abdominal pain, nausea, emesis, diarrhea, constipation, melena, but occasionally has trouble with hemorrhoids, which usually resolves with HC suppositories.  No dysuria, frequency or hematuria.  No polyuria, polydipsia, polyphagia or blurred vision.  Denies pruritus or skin rashes.  ED Course: Initial vital signs pulse 92, respirations 28, blood pressure 163/80 mmHg O2 sat 96% on nasal cannula oxygen.  The patient received Solu-Medrol 125 mg IVP and 2 DuoNebs in the emergency department.  White count was 11.0 with 54% neutrophils, 16% lymphocytes, 8% monocytes and 19% eosinophils.  Hemoglobin was 11.0 g/dL and platelets 486.  Influenza a and B by PCR was negative.  Troponin was normal.  BNP was 71.0 pg/mL.  CMP shows a sodium 129 and chloride of 88 mmol/L.  All other electrolytes are within normal limits when calcium is corrected to albumin low 2.8 g/dL.  BUN is 31, creatinine 1.23 and glucose 247 mg/dL.  Alkaline phosphatase is slightly elevated at 155 units/L, transaminases and bilirubin are within normal limits.  Calcium was 2.3 and phosphorus 5.2 mg/dL.  His  chest radiograph did not have any acute cardiopulmonary pathology.  Review of Systems: As per HPI otherwise 10 point review of systems negative.   Past Medical History:  Diagnosis Date  . Allergy    Rhinitis  . Atrial fibrillation ([place])   . Bronchitis   . Chronic respiratory failure ([place])   . Colon polyps   . COPD (chronic obstructive pulmonary disease) ([place])   . Diabetes mellitus   . Elevated lipids   . Hypercholesterolemia   . Hypertension   . Iron deficiency anemia due to chronic blood loss 04/30/2018  . Noncompliance   . On home O2    2L N/C   . PSA elevation   . Pulmonary fibrosis ([place])   . Vitamin D deficiency    Past Surgical History:  Procedure Laterality Date  . BIOPSY  04/23/2018   Procedure: BIOPSY;  Surgeon: Danie Binder, MD;  Location: [facility];  Service: Endoscopy;;  duodenum gastric  . CATARACT EXTRACTION W/PHACO  06/25/2012   Procedure: CATARACT EXTRACTION PHACO AND INTRAOCULAR LENS PLACEMENT (IOC);  Surgeon: Elta Guadeloupe T. Gershon Crane, MD;  Location: [facility];  Service: Ophthalmology;  Laterality: Left;  CDE=19.01  . CATARACT EXTRACTION W/PHACO  07/09/2012   Procedure: CATARACT EXTRACTION PHACO AND INTRAOCULAR LENS PLACEMENT (IOC);  Surgeon: Elta Guadeloupe T. Gershon Crane, MD;  Location: [facility];  Service: Ophthalmology;  Laterality: Right;  CDE: 20.09  . COLONOSCOPY WITH PROPOFOL N/A 04/23/2018   Procedure: COLONOSCOPY WITH PROPOFOL;  Surgeon: Danie Binder, MD;  Location: [facility];  Service: Endoscopy;  Laterality: N/A;  . ESOPHAGOGASTRODUODENOSCOPY (EGD) WITH PROPOFOL N/A 04/23/2018   Procedure: ESOPHAGOGASTRODUODENOSCOPY (EGD) WITH PROPOFOL;  Surgeon: Danie Binder,  MD;  Location: [facility];  Service: Endoscopy;  Laterality: N/A;     reports that he quit smoking about 5 years ago. His smoking use included cigarettes. He has a 90.00 pack-year smoking history. He has never used smokeless tobacco. He reports that he does not drink alcohol or use drugs.  Allergies   Allergen Reactions  . Ace Inhibitors Other (See Comments)    Hyperkalemia--07/23/2013:patient states not familiar with the following allergy    Family History  Problem Relation Age of Onset  . Heart disease Mother   . CAD Other   . Diabetes Other    Prior to Admission medications   Medication Sig Start Date End Date Taking? Authorizing Provider  albuterol (PROVENTIL) (2.5 MG/3ML) 0.083% nebulizer solution INHALE 1 VIAL VIA NEBULIZER EVERY 6 HOURS AS NEEDED FOR WHEEZING OR SHORTNESS OF BREATH Patient taking differently: Take 2.5 mg by nebulization every 6 (six) hours as needed for wheezing or shortness of breath.  07/03/18  Yes Orlena Sheldon, PA-C  apixaban (ELIQUIS) 2.5 MG TABS tablet Take 1 tablet (2.5 mg total) by mouth 2 (two) times daily. Restart on 7/24 04/23/18  Yes Kathie Dike, MD  aspirin EC 81 MG tablet Take 1 tablet (81 mg total) by mouth daily. Resume on 7/24 04/23/18  Yes Kathie Dike, MD  cloNIDine (CATAPRES) 0.1 MG tablet Take 1 tablet (0.1 mg total) by mouth 2 (two) times daily. 10/03/18  Yes Susy Frizzle, MD  diltiazem (CARDIZEM CD) 240 MG 24 hr capsule Take 1 capsule (240 mg total) by mouth daily. 08/28/18  Yes Susy Frizzle, MD  Fluticasone-Umeclidin-Vilant (TRELEGY ELLIPTA) 100-62.5-25 MCG/INH AEPB Inhale 1 Inhaler into the lungs daily. 08/19/18  Yes Susy Frizzle, MD  furosemide (LASIX) 40 MG tablet Take 1 tablet (40 mg total) by mouth every other day. Takes with Potassium 20MEQ. First dose of Lasix 11/6. Patient taking differently: Take 40 mg by mouth daily. Takes with Potassium 20MEQ. First dose of Lasix 11/6. 08/05/18 08/05/19 Yes Samuella Cota, MD  hydrocortisone (ANUSOL-HC) 25 MG suppository Place 1 suppository (25 mg total) rectally 2 (two) times daily as needed for hemorrhoids or anal itching. 08/05/18 08/05/19 Yes Samuella Cota, MD  Insulin Glargine [facility] KWIKPEN) 100 UNIT/ML SOPN Inject 20 Units into the skin 2 (two) times daily.   Yes  [provider]  metoprolol tartrate (LOPRESSOR) 25 MG tablet TAKE 1 TABLET BY MOUTH TWICE DAILY 10/01/18  Yes Susy Frizzle, MD  OXYGEN Inhale 3 L into the lungs continuous.    Yes [provider]  pantoprazole (PROTONIX) 40 MG tablet Take 1 tablet (40 mg total) by mouth 2 (two) times daily before a meal. 04/23/18  Yes Memon, Jolaine Artist, MD  potassium chloride SA (K-DUR,KLOR-CON) 20 MEQ tablet Take 1 tablet (20 mEq total) by mouth daily. When he takes lasix 10/01/18  Yes Susy Frizzle, MD  pravastatin (PRAVACHOL) 80 MG tablet TAKE 1 TABLET BY MOUTH AT BEDTIME 10/03/18  Yes Susy Frizzle, MD  ONE TOUCH ULTRA TEST test strip  06/26/18   [provider]  [facility] DELICA LANCETS 01S MISC USE TO CHECK BLOOD SUGAR TWICE DAILY AS DIRECTED 02/22/18   Dena Billet B, PA-C  polyethylene glycol (MIRALAX / GLYCOLAX) packet Take 17 g by mouth daily. Patient taking differently: Take 17 g by mouth daily as needed for mild constipation or moderate constipation.  04/24/18   Kathie Dike, MD    Physical Exam: Vitals:   10/04/18 2130 10/04/18  2200 10/04/18 2215 10/04/18 2230  BP: 139/75 (!) 152/65  (!) 151/72  Pulse: 80 79 81 89  Resp: (!) 23 (!) 22    SpO2: 99% 98% 98% 100%  Weight:      Height:        Constitutional: NAD, calm, comfortable Eyes: PERRL, lids and conjunctivae normal ENMT: Mucous membranes are moist. Posterior pharynx clear of any exudate or lesions. Neck: normal, supple, no masses, no thyromegaly Respiratory: Decreased breath sounds with wheezing bilaterally, no crackles. Normal respiratory effort. No accessory muscle use.  Cardiovascular: Regular rate and rhythm, no murmurs / rubs / gallops. No extremity edema. 2+ pedal pulses. No carotid bruits.  Abdomen: Soft, no tenderness, no masses palpated. No hepatosplenomegaly. Bowel sounds positive.  Musculoskeletal: no cyanosis. Good ROM, no contractures. Normal muscle tone.  Skin: no rashes, lesions, ulcers.  No induration Neurologic: CN 2-12 grossly intact. Sensation intact, DTR normal. Strength 5/5 in all 4.  Psychiatric: Normal judgment and insight. Alert and oriented x 3. Normal mood.   Labs on Admission: I have personally reviewed following labs and imaging studies  CBC: Recent Labs  Lab 10/04/18 2003  WBC 11.0*  NEUTROABS 6.0  HGB 11.0*  HCT 35.6*  MCV 89.4  PLT 381*   Basic Metabolic Panel: Recent Labs  Lab 10/04/18 2003  NA 129*  K 4.3  CL 88*  CO2 30  GLUCOSE 247*  BUN 31*  CREATININE 1.23  CALCIUM 8.7*  MG 2.3  PHOS 5.2*   GFR: Estimated Creatinine Clearance: 47.2 mL/min (by C-G formula based on SCr of 1.23 mg/dL). Liver Function Tests: Recent Labs  Lab 10/04/18 2003  AST 17  ALT 12  ALKPHOS 155*  BILITOT 0.4  PROT 6.9  ALBUMIN 2.8*   No results for input(s): LIPASE, AMYLASE in the last 168 hours. No results for input(s): AMMONIA in the last 168 hours. Coagulation Profile: No results for input(s): INR, PROTIME in the last 168 hours. Cardiac Enzymes: No results for input(s): CKTOTAL, CKMB, CKMBINDEX, TROPONINI in the last 168 hours. BNP (last 3 results) No results for input(s): PROBNP in the last 8760 hours. HbA1C: No results for input(s): HGBA1C in the last 72 hours. CBG: Recent Labs  Lab 10/04/18 2246  GLUCAP 210*   Lipid Profile: No results for input(s): CHOL, HDL, LDLCALC, TRIG, CHOLHDL, LDLDIRECT in the last 72 hours. Thyroid Function Tests: No results for input(s): TSH, T4TOTAL, FREET4, T3FREE, THYROIDAB in the last 72 hours. Anemia Panel: No results for input(s): VITAMINB12, FOLATE, FERRITIN, TIBC, IRON, RETICCTPCT in the last 72 hours. Urine analysis:    Component Value Date/Time   COLORURINE YELLOW 08/04/2018 1253   APPEARANCEUR HAZY (A) 08/04/2018 1253   LABSPEC 1.013 08/04/2018 1253   PHURINE 5.0 08/04/2018 1253   GLUCOSEU 50 (A) 08/04/2018 1253   HGBUR SMALL (A) 08/04/2018 1253   BILIRUBINUR NEGATIVE 08/04/2018 1253    KETONESUR NEGATIVE 08/04/2018 1253   PROTEINUR >=300 (A) 08/04/2018 1253   NITRITE NEGATIVE 08/04/2018 1253   LEUKOCYTESUR SMALL (A) 08/04/2018 1253    Radiological Exams on Admission: Dg Chest 2 View  Result Date: 10/04/2018 CLINICAL DATA:  Shortness of breath. EXAM: CHEST - 2 VIEW COMPARISON:  PA and lateral chest 07/31/2018 and 09/25/2017. FINDINGS: The lungs are emphysematous. No consolidative process, pneumothorax or effusion. Aortic atherosclerosis is noted. Heart size is normal. No acute or focal bony abnormality. IMPRESSION: Emphysema without acute disease. Electronically Signed   By: Inge Rise M.D.   On: 10/04/2018 20:27  10/04/2018 echocardiogram ------------------------------------------------------------------- LV EF: 65% -   70%  ------------------------------------------------------------------- Indications:      Atrial fibrillation - 427.31.  ------------------------------------------------------------------- History:   PMH:  Atrial fibrillation with RVR Acquired from the patient and from the patient&'s chart.  Chronic obstructive pulmonary disease.  Risk factors:  Elevated lipids. Hypertension. Diabetes mellitus.  ------------------------------------------------------------------- Study Conclusions  - Left ventricle: The cavity size was normal. Wall thickness was   increased in a pattern of mild LVH. Systolic function was   vigorous. The estimated ejection fraction was in the range of 65%   to 70%. Wall motion was normal; there were no regional wall   motion abnormalities. Doppler parameters are consistent with   abnormal left ventricular relaxation (grade 1 diastolic   dysfunction). - Aortic valve: Trileaflet; moderately thickened, moderately   calcified leaflets. Valve mobility was restricted. There was mild   stenosis. Peak velocity (S): 251 cm/s. Mean gradient (S): 12 mm   Hg. Valve area (VTI): 1.52 cm^2. Valve area (Vmax): 1.24 cm^2.   Valve area  (Vmean): 1.28 cm^2. - Left atrium: The atrium was normal in size.  EKG: Independently reviewed. Vent. rate 89 BPM PR interval * ms QRS duration 86 ms QT/QTc 371/452 ms P-R-T axes 75 69 59 Sinus rhythm.  Assessment/Plan Principal Problem:   COPD exacerbation ([place]) Observation/telemetry. Continue supplemental oxygen. Continue scheduled and as needed bronchodilators. Solu-Medrol 40 mg IVP every 8 hours. Switch to prednisone taper once clinically improved.  Active Problems:   Diabetes mellitus type 2, uncontrolled ([name]) Carbohydrate modified diet. Continue Lantus 20 units SQ twice daily. CBG monitoring with regular insulin sliding scale. Minimize glucocorticoid use, but will continue in the presence of eosinophilia.    Hypertension Continue Cardizem CD 240 mg p.o. daily. Continue clonidine 0.1 mg p.o. twice daily. Continue metoprolol 25 mg p.o. twice daily. Monitor blood pressure and heart rate.    Hypercholesterolemia Continue pravastatin 80 mg po daily. LFTs and fasting lipids follow-up as an outpatient.    Chronic diastolic CHF (congestive heart failure) ([name]) No decompensation from this at this time. No edema.  No elevated BNP.    AF (paroxysmal atrial fibrillation) ([name]) CHA?DS?-VASc Score of at least 5. Continue Cardizem CD and metoprolol for rate control. Continue apixaban for anticoagulation.  2345    CKD (chronic kidney disease), stage III ([name]) Monitor renal function and electrolytes.    Iron deficiency anemia due to chronic blood loss Monitor hematocrit and hemoglobin.    Hyponatremia Secondary to diuretic use. Gentle and time-limited IV hydration with NS. Follow-up sodium level.   DVT prophylaxis: On Apixaban. Code Status: Full code. Family Communication:  Disposition Plan: Observation for COPD exacerbation treatment. Consults called:  Admission status: Observation/Telemetry.   Reubin Milan MD [facility]  If 7PM-7AM, please  contact night-coverage [email or web address] Password [facility]  10/04/2018, 10:53 PM

## 2018-10-04 NOTE — Telephone Encounter (Signed)
We do have some and will put his name on it and he can pick up monday

## 2018-10-04 NOTE — ED Triage Notes (Signed)
Pt wears 3L JAARS chronic o2 pt reports increased sob with non productive cough since before christmas. Pt denies other symptoms. Pt did not wear oxygen into hospital when he arrived. sats  70 on room air. 3L applied pt states went up to 90s within short 1 min.

## 2018-10-04 NOTE — Telephone Encounter (Signed)
Son [name] called states patient would like to know if we have another box of ensure. He has a lab appt on Monday.  CB# [phone]

## 2018-10-04 NOTE — ED Provider Notes (Signed)
Emergency Department Provider Note   I have reviewed the triage vital signs and the nursing notes.   HISTORY  Chief Complaint Shortness of Breath   HPI Edward [name] is a 84 y.o. male with PMH of A-fib, COPD on 3L [place] at home, Pulmonary Fibrosis, dCHF (EF 65-70%), HTN, and HLD presents to the emergency department for evaluation of progressively worsening shortness of breath over the past 2 weeks.  Patient states that he has experienced nasal congestion in addition to coughing.  He states that the cough is nonproductive but feels like something needs to "come up."  3 days ago he had some momentary left-sided chest pain but nothing since that time.  He states that his legs have their normal amount of swelling but swelling is not worse.  He has been using his home oxygen and albuterol nebulizer.  He states that he uses it very often and the last several days has been using it back to back.  He denies any fevers, shaking chills, body aches.  He did not get the flu shot this year.  No sick contacts.  No radiation of symptoms or other modifying factors.   Past Medical History:  Diagnosis Date  . Allergy    Rhinitis  . Atrial fibrillation ([place])   . Bronchitis   . Chronic respiratory failure ([place])   . Colon polyps   . COPD (chronic obstructive pulmonary disease) ([place])   . Diabetes mellitus   . Elevated lipids   . Hypercholesterolemia   . Hypertension   . Iron deficiency anemia due to chronic blood loss 04/30/2018  . Noncompliance   . On home O2    2L N/C   . PSA elevation   . Pulmonary fibrosis ([place])   . Vitamin D deficiency     Patient Active Problem List   Diagnosis Date Noted  . Atrial fibrillation ([place])   . COPD with acute exacerbation ([place]) 07/31/2018  . Hyponatremia 07/31/2018  . Iron deficiency anemia due to chronic blood loss 04/30/2018  . Respiratory distress   . Hematochezia   . Goals of care, counseling/discussion   . Palliative care by specialist   . DNR (do not  resuscitate) discussion   . Acute renal failure with acute tubular necrosis superimposed on stage 3 chronic kidney disease ([place])   . Palliative care encounter   . COPD exacerbation ([place]) 04/17/2018  . Acute on chronic diastolic CHF (congestive heart failure) ([place])   . Severe anemia 02/17/2018  . Bradycardia 02/17/2018  . Hypothermia   . Gastroesophageal reflux disease   . Acute encephalopathy 09/25/2017  . On home O2 09/25/2017  . Insomnia 03/28/2017  . CKD (chronic kidney disease), stage III ([place]) 03/07/2017  . AF (paroxysmal atrial fibrillation) ([place]) 03/05/2017  . Chronic diastolic CHF (congestive heart failure) ([place]) 03/04/2017  . HCAP (healthcare-associated pneumonia) 03/04/2017  . Sepsis due to pneumonia ([place]) 02/25/2017  . Constipation 02/25/2017  . Overflow diarrhea/Constipation 02/25/2017  . Non compliance w medication regimen 12/02/2015  . Hypercholesterolemia 05/12/2014  . Acute on chronic respiratory failure with hypoxemia ([place]) 11/17/2013  . Elevated PSA 01/20/2013  . Diabetes mellitus type 2, uncontrolled ([place])   . COPD (chronic obstructive pulmonary disease) ([place])   . Hypertension   . Elevated lipids   . Pulmonary fibrosis ([place])   . Colon polyps   . Colon polyps     Past Surgical History:  Procedure Laterality Date  . BIOPSY  04/23/2018   Procedure: BIOPSY;  Surgeon: Oneida Alar,  Marga Melnick, MD;  Location: [facility];  Service: Endoscopy;;  duodenum gastric  . CATARACT EXTRACTION W/PHACO  06/25/2012   Procedure: CATARACT EXTRACTION PHACO AND INTRAOCULAR LENS PLACEMENT (IOC);  Surgeon: Elta Guadeloupe T. Gershon Crane, MD;  Location: [facility];  Service: Ophthalmology;  Laterality: Left;  CDE=19.01  . CATARACT EXTRACTION W/PHACO  07/09/2012   Procedure: CATARACT EXTRACTION PHACO AND INTRAOCULAR LENS PLACEMENT (IOC);  Surgeon: Elta Guadeloupe T. Gershon Crane, MD;  Location: [facility];  Service: Ophthalmology;  Laterality: Right;  CDE: 20.09  . COLONOSCOPY WITH PROPOFOL N/A 04/23/2018   Procedure: COLONOSCOPY  WITH PROPOFOL;  Surgeon: Danie Binder, MD;  Location: [facility];  Service: Endoscopy;  Laterality: N/A;  . ESOPHAGOGASTRODUODENOSCOPY (EGD) WITH PROPOFOL N/A 04/23/2018   Procedure: ESOPHAGOGASTRODUODENOSCOPY (EGD) WITH PROPOFOL;  Surgeon: Danie Binder, MD;  Location: [facility];  Service: Endoscopy;  Laterality: N/A;    Allergies Ace inhibitors  Family History  Problem Relation Age of Onset  . Heart disease Mother   . CAD Other   . Diabetes Other     Social History Social History   Tobacco Use  . Smoking status: Former Smoker    Packs/day: 1.50    Years: 60.00    Pack years: 90.00    Types: Cigarettes    Last attempt to quit: 12/31/2012    Years since quitting: 5.7  . Smokeless tobacco: Never Used  Substance Use Topics  . Alcohol use: No  . Drug use: No    Review of Systems  Constitutional: No fever/chills Eyes: No visual changes. ENT: No sore throat. Positive nasal congestion.  Cardiovascular: Denies chest pain in the last 3 days.  Respiratory: Positive shortness of breath of cough.  Gastrointestinal: No abdominal pain.  No nausea, no vomiting.  No diarrhea.  No constipation. Genitourinary: Negative for dysuria. Musculoskeletal: Negative for back pain. Skin: Negative for rash. Neurological: Negative for headaches, focal weakness or numbness.  10-point ROS otherwise negative.  ____________________________________________   PHYSICAL EXAM:  VITAL SIGNS: ED Triage Vitals  Enc Vitals Group     BP 10/04/18 1950 (!) 163/80     Pulse Rate 10/04/18 1950 92     Resp 10/04/18 1950 (!) 28     SpO2 10/04/18 1950 96 %     Weight 10/04/18 1949 187 lb (84.8 kg)     Height 10/04/18 1949 5\' 5"  (1.651 m)     Pain Score 10/04/18 1949 0   Constitutional: Alert and oriented. Well appearing and in no acute distress. Eyes: Conjunctivae are normal.  Head: Atraumatic. Nose: No congestion/rhinnorhea. Mouth/Throat: Mucous membranes are moist.  Neck: No stridor.    Cardiovascular: Normal rate, regular rhythm. Good peripheral circulation. Grossly normal heart sounds.   Respiratory: Increased respiratory effort.  No retractions. Lungs diminished at the bases bilaterally. No rales. Poor air entry overall with end-expiratory wheezing.  Gastrointestinal: Soft and nontender. No distention.  Musculoskeletal: No lower extremity tenderness with bilateral 1+ pitting edema. No gross deformities of extremities. Neurologic:  Normal speech and language. No gross focal neurologic deficits are appreciated.  Skin:  Skin is warm, dry and intact. No rash noted.   ____________________________________________   LABS (all labs ordered are listed, but only abnormal results are displayed)  Labs Reviewed  COMPREHENSIVE METABOLIC PANEL - Abnormal; Notable for the following components:      Result Value   Sodium 129 (*)    Chloride 88 (*)    Glucose, Bld 247 (*)    BUN 31 (*)  Calcium 8.7 (*)    Albumin 2.8 (*)    Alkaline Phosphatase 155 (*)    GFR calc non Af Amer 55 (*)    All other components within normal limits  CBC WITH DIFFERENTIAL/PLATELET - Abnormal; Notable for the following components:   WBC 11.0 (*)    RBC 3.98 (*)    Hemoglobin 11.0 (*)    HCT 35.6 (*)    Platelets 486 (*)    Eosinophils Absolute 2.1 (*)    Abs Immature Granulocytes 0.18 (*)    All other components within normal limits  BRAIN NATRIURETIC PEPTIDE  INFLUENZA PANEL BY PCR (TYPE A & B)  MAGNESIUM  PHOSPHORUS  COMPREHENSIVE METABOLIC PANEL  CBC  I-STAT TROPONIN, ED   ____________________________________________  EKG   EKG Interpretation  Date/Time:  Friday October 04 2018 19:51:50 EST Ventricular Rate:  89 PR Interval:    QRS Duration: 86 QT Interval:  371 QTC Calculation: 452 R Axis:   69 Text Interpretation:  Sinus rhythm No STEMI.  Confirmed by Nanda Quinton [phone]) on 10/04/2018 7:58:51 PM       ____________________________________________  RADIOLOGY  Dg Chest  2 View  Result Date: 10/04/2018 CLINICAL DATA:  Shortness of breath. EXAM: CHEST - 2 VIEW COMPARISON:  PA and lateral chest 07/31/2018 and 09/25/2017. FINDINGS: The lungs are emphysematous. No consolidative process, pneumothorax or effusion. Aortic atherosclerosis is noted. Heart size is normal. No acute or focal bony abnormality. IMPRESSION: Emphysema without acute disease. Electronically Signed   By: Inge Rise M.D.   On: 10/04/2018 20:27    ____________________________________________   PROCEDURES  Procedure(s) performed:   Procedures  CRITICAL CARE Performed by: Margette Fast Total critical care time: 35 minutes Critical care time was exclusive of separately billable procedures and treating other patients. Critical care was necessary to treat or prevent imminent or life-threatening deterioration. Critical care was time spent personally by me on the following activities: development of treatment plan with patient and/or surrogate as well as nursing, discussions with consultants, evaluation of patient's response to treatment, examination of patient, obtaining history from patient or surrogate, ordering and performing treatments and interventions, ordering and review of laboratory studies, ordering and review of radiographic studies, pulse oximetry and re-evaluation of patient's condition.  Nanda Quinton, MD Emergency Medicine  ____________________________________________   INITIAL IMPRESSION / ASSESSMENT AND PLAN / ED COURSE  Pertinent labs & imaging results that were available during my care of the patient were reviewed by me and considered in my medical decision making (see chart for details).  Patient presents to the emergency department with shortness of breath, nonproductive cough.  Has diminished sounds at the bases with poor air entry and wheezing on exam.  Exam is symmetrical.  Patient has had congestion symptoms in addition to your breathing difficulty.  He has known  history of COPD and pulmonary fibrosis.  He is hypoxic on arrival but did not arrive with his home oxygen in place.  Improved after placing patient on nasal cannula.  Plan for nebs here along with screening labs and chest x-ray.   09:00 PM Patient feeling slightly better after neb but now with more wheezing which would be expected given his underlying COPD and improved air movement.  Lab work showing normal troponin, negative flu, and no infiltrate on chest x-ray.  Patient's hyponatremia likely secondary to hyperglycemia.  No anion gap to suspect DKA. Plan for additional neb and reassess.   Only mild improvement after 3 nebs in the ED. Increased  WOB continues but not worsening on my re-evaluation. No BiPAP but will admit for nebs and observation through the night.   Discussed patient's case with Hospitalist, Dr. Olevia Bowens to request admission. Patient and family (if present) updated with plan. Care transferred to Hospitalist service.  I reviewed all nursing notes, vitals, pertinent old records, EKGs, labs, imaging (as available).  ____________________________________________  FINAL CLINICAL IMPRESSION(S) / ED DIAGNOSES  Final diagnoses:  COPD exacerbation ([place])  Hypoxia     MEDICATIONS GIVEN DURING THIS VISIT:  Medications  ipratropium-albuterol (DUONEB) 0.5-2.5 (3) MG/3ML nebulizer solution (  Not Given 10/04/18 1959)  magnesium sulfate IVPB 2 g 50 mL (has no administration in time range)  ondansetron (ZOFRAN) tablet 4 mg (has no administration in time range)    Or  ondansetron (ZOFRAN) injection 4 mg (has no administration in time range)  acetaminophen (TYLENOL) tablet 650 mg (has no administration in time range)    Or  acetaminophen (TYLENOL) suppository 650 mg (has no administration in time range)  albuterol (PROVENTIL) (2.5 MG/3ML) 0.083% nebulizer solution 2.5 mg (has no administration in time range)  insulin aspart (novoLOG) injection 0-5 Units (has no administration in time range)   insulin aspart (novoLOG) injection 0-20 Units (has no administration in time range)  ipratropium-albuterol (DUONEB) 0.5-2.5 (3) MG/3ML nebulizer solution 3 mL (has no administration in time range)  ipratropium-albuterol (DUONEB) 0.5-2.5 (3) MG/3ML nebulizer solution 3 mL (3 mLs Nebulization Given 10/04/18 1957)  albuterol (PROVENTIL) (2.5 MG/3ML) 0.083% nebulizer solution (2.5 mg  Given 10/04/18 1958)  methylPREDNISolone sodium succinate (SOLU-MEDROL) 125 mg/2 mL injection 125 mg (125 mg Intravenous Given 10/04/18 2117)  ipratropium-albuterol (DUONEB) 0.5-2.5 (3) MG/3ML nebulizer solution 3 mL (3 mLs Nebulization Given 10/04/18 2121)     Note:  This document was prepared using Dragon voice recognition software and may include unintentional dictation errors.  Nanda Quinton, MD Emergency Medicine    Mariany Mackintosh, [facility], MD 10/04/18 [phone]

## 2018-10-05 DIAGNOSIS — N183 Chronic kidney disease, stage 3 (moderate): Secondary | ICD-10-CM | POA: Diagnosis not present

## 2018-10-05 DIAGNOSIS — I480 Paroxysmal atrial fibrillation: Secondary | ICD-10-CM

## 2018-10-05 DIAGNOSIS — I10 Essential (primary) hypertension: Secondary | ICD-10-CM | POA: Diagnosis not present

## 2018-10-05 DIAGNOSIS — E871 Hypo-osmolality and hyponatremia: Secondary | ICD-10-CM | POA: Diagnosis not present

## 2018-10-05 DIAGNOSIS — J441 Chronic obstructive pulmonary disease with (acute) exacerbation: Secondary | ICD-10-CM | POA: Diagnosis not present

## 2018-10-05 DIAGNOSIS — R0902 Hypoxemia: Secondary | ICD-10-CM | POA: Insufficient documentation

## 2018-10-05 LAB — COMPREHENSIVE METABOLIC PANEL
ALBUMIN: 2.8 g/dL — AB (ref 3.5–5.0)
ALT: 15 U/L (ref 0–44)
AST: 20 U/L (ref 15–41)
Alkaline Phosphatase: 164 U/L — ABNORMAL HIGH (ref 38–126)
Anion gap: 11 (ref 5–15)
BUN: 32 mg/dL — ABNORMAL HIGH (ref 8–23)
CALCIUM: 8.8 mg/dL — AB (ref 8.9–10.3)
CO2: 28 mmol/L (ref 22–32)
Chloride: 90 mmol/L — ABNORMAL LOW (ref 98–111)
Creatinine, Ser: 1.19 mg/dL (ref 0.61–1.24)
GFR calc Af Amer: >60 mL/min (ref >60)
GFR calc non Af Amer: 57 mL/min — ABNORMAL LOW (ref >60)
Glucose, Bld: 318 mg/dL — ABNORMAL HIGH (ref 70–99)
Potassium: 4.8 mmol/L (ref 3.5–5.1)
Sodium: 129 mmol/L — ABNORMAL LOW (ref 135–145)
Total Bilirubin: 0.3 mg/dL (ref 0.3–1.2)
Total Protein: 7.1 g/dL (ref 6.5–8.1)

## 2018-10-05 LAB — CBC
HEMATOCRIT: 34.6 % — AB (ref 39.0–52.0)
HEMOGLOBIN: 10.5 g/dL — AB (ref 13.0–17.0)
MCH: 27.1 pg (ref 26.0–34.0)
MCHC: 30.3 g/dL (ref 30.0–36.0)
MCV: 89.4 fL (ref 80.0–100.0)
Platelets: 479 10*3/uL — ABNORMAL HIGH (ref 150–400)
RBC: 3.87 MIL/uL — AB (ref 4.22–5.81)
RDW: 13.3 % (ref 11.5–15.5)
WBC: 9.4 10*3/uL (ref 4.0–10.5)
nRBC: 0 % (ref 0.0–0.2)

## 2018-10-05 LAB — GLUCOSE, CAPILLARY
Glucose-Capillary: 40 mg/dL — CL (ref 70–99)
Glucose-Capillary: 122 mg/dL — ABNORMAL HIGH (ref 70–99)
Glucose-Capillary: 112 mg/dL — ABNORMAL HIGH (ref 70–99)

## 2018-10-05 LAB — CBG MONITORING, ED
Glucose-Capillary: 330 mg/dL — ABNORMAL HIGH (ref 70–99)
Glucose-Capillary: 281 mg/dL — ABNORMAL HIGH (ref 70–99)

## 2018-10-05 NOTE — ED Notes (Signed)
Resp at bedside

## 2018-10-05 NOTE — Progress Notes (Signed)
Patient admitted to 331. He is awake, oriented and able to walk to the bed. Vitals are stable and he denies pain. Will continue to monitor.

## 2018-10-05 NOTE — Progress Notes (Signed)
[facility] Progress Note  Subjective: no new c/o, coughing a little better, SOB ok  Vitals:   10/05/18 1330 10/05/18 1400 10/05/18 1419 10/05/18 1438  BP: 122/74 105/76  134/76  Pulse: 85 96  70  Resp:    19  Temp:      TempSrc:      SpO2: 98% 96% 96% 99%  Weight:      Height:        Inpatient medications: . apixaban  2.5 mg Oral BID  . aspirin EC  81 mg Oral Daily  . cloNIDine  0.1 mg Oral BID  . diltiazem  240 mg Oral Daily  . furosemide  40 mg Oral Daily  . insulin aspart  0-20 Units Subcutaneous TID WC  . insulin aspart  0-5 Units Subcutaneous QHS  . insulin glargine  20 Units Subcutaneous BID  . ipratropium-albuterol  3 mL Nebulization Q6H  . metoprolol tartrate  25 mg Oral BID  . pantoprazole  40 mg Oral BID AC  . potassium chloride SA  20 mEq Oral Daily  . pravastatin  80 mg Oral QHS    acetaminophen **OR** acetaminophen, albuterol, hydrocortisone, ondansetron **OR** ondansetron (ZOFRAN) IV  Exam: Constitutional: NAD, calm, comfortable , [name] not in distress, disheveled Eyes: PERRL, lids and conjunctivae normal ENMT: Mucous membranes are moist. Posterior pharynx clear of any exudate or lesions. Neck: normal, supple, no masses, no thyromegaly Respiratory: poor air movement throughout, no rales , +wheezing scattered throughout bilat lungs, normal respiratory effort. No accessory muscle use.  Cardiovascular: Regular rate and rhythm, no murmurs / rubs / gallops. No extremity edema. 2+ pedal pulses. No carotid bruits.  Abdomen: Soft, no tenderness, no masses palpated. No hepatosplenomegaly.  Musculoskeletal: no cyanosis. Good ROM, no contractures. Normal muscle tone.  Skin: no rashes, lesions, ulcers. No induration Neurologic: CN 2-12 grossly intact. Sensation intact, DTR normal. Strength 5/5 in all 4.  Psychiatric: Normal judgment and insight. Alert and oriented x 3. Normal mood.     Presentation Summary: Edward [name] is a 84 y.o. male with  medical history significant of allergy, PAF (atrial fibrillation), COPD, diabetes mellitus, hyperlipidemia, hypertension, iron deficiency anemia who is coming to the emergency department with complaints of progressively worse dyspnea with wheezing, fatigue, productive cough of whitish sputum since before Christmas.  He denies fever, chills, sore throat or rhinorrhea.  He denies hemoptysis, pleuritic chest pain, palpitations, dizziness, diaphoresis, PND, orthopnea.  He occasionally gets pitting edema of the lower extremities.  No abdominal pain, nausea, emesis, diarrhea, constipation, melena, but occasionally has trouble with hemorrhoids, which usually resolves with HC suppositories.  No dysuria, frequency or hematuria.  No polyuria, polydipsia, polyphagia or blurred vision.  Denies pruritus or skin rashes. ED Course: Initial vital signs pulse 92, respirations 28, blood pressure 163/80 mmHg O2 sat 96% on nasal cannula oxygen.  The patient received Solu-Medrol 125 mg IVP and 2 DuoNebs in the emergency department.  White count was 11.0 with 54% neutrophils, 16% lymphocytes, 8% monocytes and 19% eosinophils.  Hemoglobin was 11.0 g/dL and platelets 486.  Influenza a and B by PCR was negative.  Troponin was normal.  BNP was 71.0 pg/mL.  CMP shows a sodium 129 and chloride of 88 mmol/L.  All other electrolytes are within normal limits when calcium is corrected to albumin low 2.8 g/dL.  BUN is 31, creatinine 1.23 and glucose 247 mg/dL.  Alkaline phosphatase is slightly elevated at 155 units/L, transaminases and bilirubin are within normal limits.  Calcium was 2.3 and phosphorus 5.2 mg/dL.  His chest radiograph did not have any acute cardiopulmonary pathology.    Home meds:  - apixaban 2.5 bid/ aspirin 81/  pravastatin 80 hs  - Kdur 20 qd/ pantoprazole 40 bid  - diltiazem cd 240 qd/ metoprolol 25 bid/ clonidine 0.1 bid/ furosemide 40 qd  - home O2 3L/ fluticasone-Umeclidine-Vilant 1 puff qd/ albuterol nebs prn  -  insulin glargine 20 u bid       Hospital Problems/ Course:  COPD exacerbation ([name]) Observation/telemetry. Continue supplemental oxygen. Continue scheduled and as needed bronchodilators. Change SoluMedrol 40 mg IVP every 8 hours to every 12 hours today Switch to prednisone taper once clinically improving Still awaiting a room upstairs    Diabetes mellitus type 2, uncontrolled ([name]) Carbohydrate modified diet. Continue Lantus 20 units SQ twice daily. CBG monitoring with regular insulin sliding scale. Minimize glucocorticoid use, but will continue in the presence of eosinophilia.    Hypertension Continue Cardizem CD 240 mg p.o. daily. Continue clonidine 0.1 mg p.o. twice daily. Continue metoprolol 25 mg p.o. twice daily. Monitor blood pressure and heart rate.    Hypercholesterolemia Continue pravastatin 80 mg po daily. LFTs and fasting lipids follow-up as an outpatient.    Chronic diastolic CHF (congestive heart failure) ([name]) No decompensation from this at this time. No edema.  No elevated BNP. Holding lasix 1-2 days for low Na, looks dry.    AF (paroxysmal atrial fibrillation) ([name]) CHA?DS?-VASc Score of at least 5. Continue Cardizem CD and metoprolol for rate control. Continue apixaban for anticoagulation.    CKD (chronic kidney disease), stage III ([name]) Monitor renal function and electrolytes.  Creat 1.2.      Iron deficiency anemia due to chronic blood loss Monitor hematocrit and hemoglobin. Hb 10.5 here.     Hyponatremia: Na 129 here Secondary to diuretic use. Looks a bit dry on exam.  Will hold lasix for now and f/u labs in am.    DVT prophylaxis: On Apixaban. Code Status: Full code. Family Communication:  no family here Disposition Plan: Observation for COPD exacerbation treatment. Consults called: none Admission status: Observation/Telemetry     Kelly Splinter MD [facility] pgr [phone] 10/05/2018, 3:27 PM   Recent Labs   Lab 10/04/18 2003 10/05/18 0538  NA 129* 129*  K 4.3 4.8  CL 88* 90*  CO2 30 28  GLUCOSE 247* 318*  BUN 31* 32*  CREATININE 1.23 1.19  CALCIUM 8.7* 8.8*  PHOS 5.2*  --    Recent Labs  Lab 10/04/18 2003 10/05/18 0538  AST 17 20  ALT 12 15  ALKPHOS 155* 164*  BILITOT 0.4 0.3  PROT 6.9 7.1  ALBUMIN 2.8* 2.8*   Recent Labs  Lab 10/04/18 2003 10/05/18 0538  WBC 11.0* 9.4  NEUTROABS 6.0  --   HGB 11.0* 10.5*  HCT 35.6* 34.6*  MCV 89.4 89.4  PLT 486* 479*   Iron/TIBC/Ferritin/ %Sat    Component Value Date/Time   IRON 63 04/29/2018 0930   TIBC 406 04/29/2018 0930   FERRITIN 161 07/02/2018 1252   IRONPCTSAT 16 (L) 04/29/2018 0930

## 2018-10-05 NOTE — Progress Notes (Signed)
CRITICAL VALUE ALERT  Critical Value:  CBG 40  Date & Time Notied:  1705 10/05/18  Provider Notified: MD Jonnie Finner   Orders Received/Actions taken: Patient given 30g snack and will recheck in 15 mins. Patient is awake, oriented, and shows no s/sx of hypoglycemia. Will continue to monitor closely.

## 2018-10-05 NOTE — ED Notes (Signed)
Awaiting bed assignment.

## 2018-10-05 NOTE — ED Notes (Signed)
Report to Lacey RN

## 2018-10-05 NOTE — ED Notes (Signed)
meal provided 

## 2018-10-05 NOTE — ED Notes (Signed)
Bed changed to 331`  Call for report   Bella Kennedy, RN unable to take report   Will call back

## 2018-10-06 ENCOUNTER — Encounter (HOSPITAL_COMMUNITY): Payer: Self-pay | Admitting: Family Medicine

## 2018-10-06 DIAGNOSIS — R600 Localized edema: Secondary | ICD-10-CM

## 2018-10-06 DIAGNOSIS — I5032 Chronic diastolic (congestive) heart failure: Secondary | ICD-10-CM | POA: Diagnosis not present

## 2018-10-06 DIAGNOSIS — D500 Iron deficiency anemia secondary to blood loss (chronic): Secondary | ICD-10-CM

## 2018-10-06 DIAGNOSIS — J441 Chronic obstructive pulmonary disease with (acute) exacerbation: Secondary | ICD-10-CM | POA: Diagnosis not present

## 2018-10-06 DIAGNOSIS — I480 Paroxysmal atrial fibrillation: Secondary | ICD-10-CM | POA: Diagnosis not present

## 2018-10-06 DIAGNOSIS — N183 Chronic kidney disease, stage 3 (moderate): Secondary | ICD-10-CM | POA: Diagnosis not present

## 2018-10-06 DIAGNOSIS — E1165 Type 2 diabetes mellitus with hyperglycemia: Secondary | ICD-10-CM

## 2018-10-06 LAB — BASIC METABOLIC PANEL
Anion gap: 7 (ref 5–15)
BUN: 44 mg/dL — ABNORMAL HIGH (ref 8–23)
CO2: 31 mmol/L (ref 22–32)
Calcium: 8.6 mg/dL — ABNORMAL LOW (ref 8.9–10.3)
Chloride: 92 mmol/L — ABNORMAL LOW (ref 98–111)
Creatinine, Ser: 1.36 mg/dL — ABNORMAL HIGH (ref 0.61–1.24)
GFR calc Af Amer: 56 mL/min — ABNORMAL LOW (ref >60)
GFR, EST NON AFRICAN AMERICAN: 48 mL/min — AB (ref >60)
Glucose, Bld: 153 mg/dL — ABNORMAL HIGH (ref 70–99)
Potassium: 5.4 mmol/L — ABNORMAL HIGH (ref 3.5–5.1)
Sodium: 130 mmol/L — ABNORMAL LOW (ref 135–145)

## 2018-10-06 LAB — GLUCOSE, CAPILLARY
Glucose-Capillary: 172 mg/dL — ABNORMAL HIGH (ref 70–99)
Glucose-Capillary: 256 mg/dL — ABNORMAL HIGH (ref 70–99)

## 2018-10-06 MED ORDER — FUROSEMIDE 10 MG/ML IJ SOLN
20.0000 mg | Freq: Once | INTRAMUSCULAR | Status: AC
  Administered 2018-10-06: 20 mg via INTRAVENOUS
  Filled 2018-10-06: qty 2

## 2018-10-06 MED ORDER — POLYETHYLENE GLYCOL 3350 17 G PO PACK: 17.0000 g | PACK | Freq: Every day | ORAL | Status: DC | PRN

## 2018-10-06 MED ORDER — FUROSEMIDE 40 MG PO TABS: 40.0000 mg | ORAL_TABLET | ORAL | Status: DC

## 2018-10-06 MED ORDER — POTASSIUM CHLORIDE CRYS ER 20 MEQ PO TBCR: 20.0000 meq | EXTENDED_RELEASE_TABLET | Freq: Every day | ORAL | 3 refills | Status: DC

## 2018-10-06 MED ORDER — DOXYCYCLINE HYCLATE 100 MG PO CAPS: 100.0000 mg | ORAL_CAPSULE | Freq: Two times a day (BID) | ORAL | 0 refills | Status: AC

## 2018-10-06 MED ORDER — INSULIN ASPART 100 UNIT/ML ~~LOC~~ SOLN: 0.0000 [IU] | Freq: Every day | SUBCUTANEOUS | Status: DC

## 2018-10-06 MED ORDER — METHYLPREDNISOLONE SODIUM SUCC 125 MG IJ SOLR
80.0000 mg | Freq: Once | INTRAMUSCULAR | Status: AC
  Administered 2018-10-06: 80 mg via INTRAVENOUS
  Filled 2018-10-06: qty 2

## 2018-10-06 MED ORDER — INSULIN ASPART 100 UNIT/ML ~~LOC~~ SOLN
0.0000 [IU] | Freq: Three times a day (TID) | SUBCUTANEOUS | Status: DC
  Administered 2018-10-06: 8 [IU] via SUBCUTANEOUS
  Administered 2018-10-06: 3 [IU] via SUBCUTANEOUS

## 2018-10-06 MED ORDER — SODIUM ZIRCONIUM CYCLOSILICATE 10 G PO PACK
10.0000 g | PACK | Freq: Once | ORAL | Status: AC
  Administered 2018-10-06: 10 g via ORAL
  Filled 2018-10-06: qty 1

## 2018-10-06 MED ORDER — INSULIN GLARGINE 100 UNIT/ML ~~LOC~~ SOLN
20.0000 [IU] | Freq: Every day | SUBCUTANEOUS | Status: DC
  Administered 2018-10-06: 20 [IU] via SUBCUTANEOUS
  Filled 2018-10-06 (×2): qty 0.2

## 2018-10-06 MED ORDER — PREDNISONE 20 MG PO TABS: ORAL_TABLET | ORAL | 0 refills | Status: DC

## 2018-10-06 NOTE — Discharge Instructions (Signed)
Seek medical care or return to ER if symptoms come back worsen or new problems develop.   Chronic Obstructive Pulmonary Disease Exacerbation Chronic obstructive pulmonary disease (COPD) is a long-term (chronic) lung problem. In COPD, the flow of air from the lungs is limited. COPD exacerbations are times that breathing gets worse and you need more than your normal treatment. Without treatment, they can be life threatening. If they happen often, your lungs can become more damaged. If your COPD gets worse, your doctor may treat you with:  Medicines.  Oxygen.  Different ways to clear your airway, such as using a mask. Follow these instructions at home: Medicines  Take over-the-counter and prescription medicines only as told by your doctor.  If you take an antibiotic or steroid medicine, do not stop taking the medicine even if you start to feel better.  Keep up with shots (vaccinations) as told by your doctor. Be sure to get a yearly (annual) flu shot. Lifestyle  Do not smoke. If you need help quitting, ask your doctor.  Eat healthy foods.  Exercise regularly.  Get plenty of sleep.  Avoid tobacco smoke and other things that can bother your lungs.  Wash your hands often with soap and water. This will help keep you from getting an infection. If you cannot use soap and water, use hand sanitizer.  During flu season, avoid areas that are crowded with people. General instructions  Drink enough fluid to keep your pee (urine) clear or pale yellow. Do not do this if your doctor has told you not to.  Use a cool mist machine (vaporizer).  If you use oxygen or a machine that turns medicine into a mist (nebulizer), continue to use it as told.  Follow all instructions for rehabilitation. These are steps you can take to make your body work better.  Keep all follow-up visits as told by your doctor. This is important. Contact a doctor if:  Your COPD symptoms get worse than normal. Get help  right away if:  You are short of breath and it gets worse.  You have trouble talking.  You have chest pain.  You cough up blood.  You have a fever.  You keep throwing up (vomiting).  You feel weak or you pass out (faint).  You feel confused.  You are not able to sleep because of your symptoms.  You are not able to do daily activities. Summary  COPD exacerbations are times that breathing gets worse and you need more treatment than normal.  COPD exacerbations can be very serious and may cause your lungs to become more damaged.  Do not smoke. If you need help quitting, ask your doctor.  Stay up-to-date on your shots. Get a flu shot every year. This information is not intended to replace advice given to you by your health care provider. Make sure you discuss any questions you have with your health care provider. Document Released: 09/07/2011 Document Revised: 10/23/2016 Document Reviewed: 10/23/2016 Elsevier Interactive Patient Education  2019 [place]    Follow with Primary MD  Susy Frizzle, MD  and other consultant's as instructed your Hospitalist MD  Please get a complete blood count and chemistry panel checked by your Primary MD at your next visit, and again as instructed by your Primary MD.  Get Medicines reviewed and adjusted: Please take all your medications with you for your next visit with your Primary MD  Laboratory/radiological data: Please request your Primary MD to go over all hospital tests  and procedure/radiological results at the follow up, please ask your Primary MD to get all Hospital records sent to his/her office.  In some cases, they will be blood work, cultures and biopsy results pending at the time of your discharge. Please request that your primary care M.D. follows up on these results.  Also Note the following: If you experience worsening of your admission symptoms, develop shortness of breath, life threatening emergency, suicidal or  homicidal thoughts you must seek medical attention immediately by calling 911 or calling your MD immediately  if symptoms less severe.  You must read complete instructions/literature along with all the possible adverse reactions/side effects for all the Medicines you take and that have been prescribed to you. Take any new Medicines after you have completely understood and accpet all the possible adverse reactions/side effects.   Do not drive when taking Pain medications or sleeping medications (Benzodaizepines)  Do not take more than prescribed Pain, Sleep and Anxiety Medications. It is not advisable to combine anxiety,sleep and pain medications without talking with your primary care practitioner  Special Instructions: If you have smoked or chewed Tobacco  in the last 2 yrs please stop smoking, stop any regular Alcohol  and or any Recreational drug use.  Wear Seat belts while driving.  Please note: You were cared for by a hospitalist during your hospital stay. Once you are discharged, your primary care physician will handle any further medical issues. Please note that NO REFILLS for any discharge medications will be authorized once you are discharged, as it is imperative that you return to your primary care physician (or establish a relationship with a primary care physician if you do not have one) for your post hospital discharge needs so that they can reassess your need for medications and monitor your lab values.

## 2018-10-06 NOTE — Progress Notes (Signed)
Removed IV-clean, dry, intact. Reviewed d/c paperwork with patient and son. Reviewed medication changes and new medication. Wheeled stable and belongings to main entrance where he was picked up by son to d/c home.

## 2018-10-06 NOTE — Discharge Summary (Signed)
Physician Discharge Summary  Edward [name] ZDG:[id number] DOB: 07/25/1935 DOA: 10/04/2018  PCP: Susy Frizzle, MD  Admit date: 10/04/2018 Discharge date: 10/06/2018  Admitted From: Home  Disposition: Home   Recommendations for Outpatient Follow-up:  1. Follow up with PCP in  3-5 days for recheck.   Discharge Condition: STABLE  CODE STATUS: FULL    Brief Hospitalization Summary: Please see all hospital notes, images, labs for full details of the hospitalization. HPI: Edward [name] is a 84 y.o. male with medical history significant of allergy, PAF (atrial fibrillation), COPD, diabetes mellitus, hyperlipidemia, hypertension, iron deficiency anemia, vitamin D deficiency who is coming to the emergency department with complaints of progressively worse dyspnea with wheezing, fatigue, productive cough of whitish sputum since before Christmas.  He denies fever, chills, sore throat or rhinorrhea.  He denies hemoptysis, pleuritic chest pain, palpitations, dizziness, diaphoresis, PND, orthopnea.  He occasionally gets pitting edema of the lower extremities.  No abdominal pain, nausea, emesis, diarrhea, constipation, melena, but occasionally has trouble with hemorrhoids, which usually resolves with HC suppositories.  No dysuria, frequency or hematuria.  No polyuria, polydipsia, polyphagia or blurred vision.  Denies pruritus or skin rashes.  ED Course: Initial vital signs pulse 92, respirations 28, blood pressure 163/80 mmHg O2 sat 96% on nasal cannula oxygen.  The patient received Solu-Medrol 125 mg IVP and 2 DuoNebs in the emergency department.  White count was 11.0 with 54% neutrophils, 16% lymphocytes, 8% monocytes and 19% eosinophils.  Hemoglobin was 11.0 g/dL and platelets 486.  Influenza a and B by PCR was negative.  Troponin was normal.  BNP was 71.0 pg/mL.  CMP shows a sodium 129 and chloride of 88 mmol/L.  All other electrolytes are within normal limits when calcium is corrected to albumin low 2.8  g/dL.  BUN is 31, creatinine 1.23 and glucose 247 mg/dL.  Alkaline phosphatase is slightly elevated at 155 units/L, transaminases and bilirubin are within normal limits.  Calcium was 2.3 and phosphorus 5.2 mg/dL.  His chest radiograph did not have any acute cardiopulmonary pathology.  COPD exacerbation ([name]) Pt says he feels much better and wants to go home, he is breathing better, he is ambulating in room and he has no SOB or chest pain.   Diabetes mellitus type 2, uncontrolled Carbohydrate modified diet. Resume home treatment plan and follow CBG closely while on steroids.   Hypertension Continue Cardizem CD 240 mgp.o. daily. Continue clonidine 0.1 mg p.o. twice daily. Continue metoprolol 25 mg p.o. twice daily.  Hypercholesterolemia Continue pravastatin 80 mg po daily. LFTs and fasting lipids follow-up as an outpatient.  Chronic diastolic CHF (congestive heart failure) No decompensationfrom this at this time. No edema. No elevated BNP. Holding lasix 1-2 days for low Na, looks dry.  AF (paroxysmal atrial fibrillation) CHA?DS?-VASc Scoreof at least 5. Continue Cardizem CD and metoprolol for rate control. Continue apixaban for anticoagulation.  CKD (chronic kidney disease), stage III  Monitor renal function and electrolytes.  Creat 1.2.    Iron deficiency anemia due to chronic blood loss Monitor hematocrit and hemoglobin. Hb 10.5 here.   Hyponatremia: Na 129 here Secondaryto diuretic use. Improved.    DVT prophylaxis:On Apixaban. Code Status:Full code. Family Communication: no family here Disposition Plan:Home   Admission status:Observation/Telemetry  Discharge Diagnoses:  Principal Problem:   COPD exacerbation ([place]) Active Problems:   Diabetes mellitus type 2, uncontrolled ([name])   Hypertension   Hypercholesterolemia   Chronic diastolic CHF (congestive heart failure) ([name])   AF (paroxysmal atrial  fibrillation) ([name])   CKD (chronic  kidney disease), stage III ([name])   Iron deficiency anemia due to chronic blood loss   Hyponatremia  Discharge Instructions: Discharge Instructions    Call MD for:  difficulty breathing, headache or visual disturbances   Complete by:  As directed    Call MD for:  extreme fatigue   Complete by:  As directed    Call MD for:  persistant dizziness or light-headedness   Complete by:  As directed    Increase activity slowly   Complete by:  As directed      Allergies as of 10/06/2018      Reactions   Ace Inhibitors Other (See Comments)   Hyperkalemia--07/23/2013:patient states not familiar with the following allergy      Medication List    TAKE these medications   albuterol (2.5 MG/3ML) 0.083% nebulizer solution Commonly known as:  PROVENTIL INHALE 1 VIAL VIA NEBULIZER EVERY 6 HOURS AS NEEDED FOR WHEEZING OR SHORTNESS OF BREATH What changed:  See the new instructions.   apixaban 2.5 MG Tabs tablet Commonly known as:  ELIQUIS Take 1 tablet (2.5 mg total) by mouth 2 (two) times daily. Restart on 7/24   aspirin EC 81 MG tablet Take 1 tablet (81 mg total) by mouth daily. Resume on 7/24   BASAGLAR KWIKPEN 100 UNIT/ML Sopn Inject 20 Units into the skin 2 (two) times daily.   cloNIDine 0.1 MG tablet Commonly known as:  CATAPRES Take 1 tablet (0.1 mg total) by mouth 2 (two) times daily.   diltiazem 240 MG 24 hr capsule Commonly known as:  CARDIZEM CD Take 1 capsule (240 mg total) by mouth daily.   doxycycline 100 MG capsule Commonly known as:  VIBRAMYCIN Take 1 capsule (100 mg total) by mouth 2 (two) times daily for 5 days.   Fluticasone-Umeclidin-Vilant 100-62.5-25 MCG/INH Aepb Commonly known as:  TRELEGY ELLIPTA Inhale 1 Inhaler into the lungs daily.   furosemide 40 MG tablet Commonly known as:  LASIX Take 1 tablet (40 mg total) by mouth every other day. Takes with Potassium 20MEQ. First dose of Lasix 11/6. Start taking on:  October 09, 2018 What changed:  These  instructions start on October 09, 2018. If you are unsure what to do until then, ask your doctor or other care provider.   hydrocortisone 25 MG suppository Commonly known as:  ANUSOL-HC Place 1 suppository (25 mg total) rectally 2 (two) times daily as needed for hemorrhoids or anal itching.   metoprolol tartrate 25 MG tablet Commonly known as:  LOPRESSOR TAKE 1 TABLET BY MOUTH TWICE DAILY   ONE TOUCH ULTRA TEST test strip Generic drug:  glucose blood   ONETOUCH DELICA LANCETS 60F Misc USE TO CHECK BLOOD SUGAR TWICE DAILY AS DIRECTED   OXYGEN Inhale 3 L into the lungs continuous.   pantoprazole 40 MG tablet Commonly known as:  PROTONIX Take 1 tablet (40 mg total) by mouth 2 (two) times daily before a meal.   polyethylene glycol packet Commonly known as:  MIRALAX / GLYCOLAX Take 17 g by mouth daily as needed for mild constipation or moderate constipation.   potassium chloride SA 20 MEQ tablet Commonly known as:  K-DUR,KLOR-CON Take 1 tablet (20 mEq total) by mouth daily. When he takes lasix Start taking on:  October 09, 2018 What changed:  These instructions start on October 09, 2018. If you are unsure what to do until then, ask your doctor or other care provider.   pravastatin 80 MG  tablet Commonly known as:  PRAVACHOL TAKE 1 TABLET BY MOUTH AT BEDTIME   predniSONE 20 MG tablet Commonly known as:  DELTASONE Take 3 PO QAM x3days, 2 PO QAM x3days, 1 PO QAM x3days Start taking on:  October 07, 2018      Follow-up Information    Susy Frizzle, MD. Schedule an appointment as soon as possible for a visit in 3 day(s).   Specialty:  Family Medicine Why:  Hospital Follow Up  Contact information: [address] [place] [address] [phone]          Allergies  Allergen Reactions  . Ace Inhibitors Other (See Comments)    Hyperkalemia--07/23/2013:patient states not familiar with the following allergy   Allergies as of 10/06/2018      Reactions   Ace  Inhibitors Other (See Comments)   Hyperkalemia--07/23/2013:patient states not familiar with the following allergy      Medication List    TAKE these medications   albuterol (2.5 MG/3ML) 0.083% nebulizer solution Commonly known as:  PROVENTIL INHALE 1 VIAL VIA NEBULIZER EVERY 6 HOURS AS NEEDED FOR WHEEZING OR SHORTNESS OF BREATH What changed:  See the new instructions.   apixaban 2.5 MG Tabs tablet Commonly known as:  ELIQUIS Take 1 tablet (2.5 mg total) by mouth 2 (two) times daily. Restart on 7/24   aspirin EC 81 MG tablet Take 1 tablet (81 mg total) by mouth daily. Resume on 7/24   BASAGLAR KWIKPEN 100 UNIT/ML Sopn Inject 20 Units into the skin 2 (two) times daily.   cloNIDine 0.1 MG tablet Commonly known as:  CATAPRES Take 1 tablet (0.1 mg total) by mouth 2 (two) times daily.   diltiazem 240 MG 24 hr capsule Commonly known as:  CARDIZEM CD Take 1 capsule (240 mg total) by mouth daily.   doxycycline 100 MG capsule Commonly known as:  VIBRAMYCIN Take 1 capsule (100 mg total) by mouth 2 (two) times daily for 5 days.   Fluticasone-Umeclidin-Vilant 100-62.5-25 MCG/INH Aepb Commonly known as:  TRELEGY ELLIPTA Inhale 1 Inhaler into the lungs daily.   furosemide 40 MG tablet Commonly known as:  LASIX Take 1 tablet (40 mg total) by mouth every other day. Takes with Potassium 20MEQ. First dose of Lasix 11/6. Start taking on:  October 09, 2018 What changed:  These instructions start on October 09, 2018. If you are unsure what to do until then, ask your doctor or other care provider.   hydrocortisone 25 MG suppository Commonly known as:  ANUSOL-HC Place 1 suppository (25 mg total) rectally 2 (two) times daily as needed for hemorrhoids or anal itching.   metoprolol tartrate 25 MG tablet Commonly known as:  LOPRESSOR TAKE 1 TABLET BY MOUTH TWICE DAILY   ONE TOUCH ULTRA TEST test strip Generic drug:  glucose blood   ONETOUCH DELICA LANCETS 65K Misc USE TO CHECK BLOOD SUGAR  TWICE DAILY AS DIRECTED   OXYGEN Inhale 3 L into the lungs continuous.   pantoprazole 40 MG tablet Commonly known as:  PROTONIX Take 1 tablet (40 mg total) by mouth 2 (two) times daily before a meal.   polyethylene glycol packet Commonly known as:  MIRALAX / GLYCOLAX Take 17 g by mouth daily as needed for mild constipation or moderate constipation.   potassium chloride SA 20 MEQ tablet Commonly known as:  K-DUR,KLOR-CON Take 1 tablet (20 mEq total) by mouth daily. When he takes lasix Start taking on:  October 09, 2018 What changed:  These  instructions start on October 09, 2018. If you are unsure what to do until then, ask your doctor or other care provider.   pravastatin 80 MG tablet Commonly known as:  PRAVACHOL TAKE 1 TABLET BY MOUTH AT BEDTIME   predniSONE 20 MG tablet Commonly known as:  DELTASONE Take 3 PO QAM x3days, 2 PO QAM x3days, 1 PO QAM x3days Start taking on:  October 07, 2018       Procedures/Studies: Dg Chest 2 View  Result Date: 10/04/2018 CLINICAL DATA:  Shortness of breath. EXAM: CHEST - 2 VIEW COMPARISON:  PA and lateral chest 07/31/2018 and 09/25/2017. FINDINGS: The lungs are emphysematous. No consolidative process, pneumothorax or effusion. Aortic atherosclerosis is noted. Heart size is normal. No acute or focal bony abnormality. IMPRESSION: Emphysema without acute disease. Electronically Signed   By: Inge Rise M.D.   On: 10/04/2018 20:27      Subjective: Pt says that he feels much better.  He says that wants to go home.  He has no shortness of breath or chest pain.    Discharge Exam: Vitals:   10/06/18 0604 10/06/18 0729  BP: (!) 124/59   Pulse: 75   Resp: 16   Temp: 98 F (36.7 C)   SpO2: 99% 93%   Vitals:   10/05/18 2120 10/06/18 0143 10/06/18 0604 10/06/18 0729  BP: (!) 141/66  (!) 124/59   Pulse: 83  75   Resp: 18  16   Temp: 97.7 F (36.5 C)  98 F (36.7 C)   TempSrc: Oral  Oral   SpO2: 96% 96% 99% 93%  Weight:      Height:        General: Pt is chronically ill appearing, awake, not in acute distress Cardiovascular: bibasilar wheeze otherwise good air movement, normalS1/S2 +, no rubs, no gallops  Respiratory: BBS with rare bibasilar wheeze but good air movment no wheezing, no rhonchi Abdominal: Soft, NT, ND, bowel sounds + Extremities: no edema, no cyanosis   The results of significant diagnostics from this hospitalization (including imaging, microbiology, ancillary and laboratory) are listed below for reference.     Microbiology: No results found for this or any previous visit (from the past 240 hour(s)).   Labs: BNP (last 3 results) Recent Labs    07/31/18 1731 08/19/18 1015 10/04/18 2004  BNP 86.0 101* 85.2   Basic Metabolic Panel: Recent Labs  Lab 10/04/18 2003 10/05/18 0538 10/06/18 0636  NA 129* 129* 130*  K 4.3 4.8 5.4*  CL 88* 90* 92*  CO2 30 28 31   GLUCOSE 247* 318* 153*  BUN 31* 32* 44*  CREATININE 1.23 1.19 1.36*  CALCIUM 8.7* 8.8* 8.6*  MG 2.3  --   --   PHOS 5.2*  --   --    Liver Function Tests: Recent Labs  Lab 10/04/18 2003 10/05/18 0538  AST 17 20  ALT 12 15  ALKPHOS 155* 164*  BILITOT 0.4 0.3  PROT 6.9 7.1  ALBUMIN 2.8* 2.8*   No results for input(s): LIPASE, AMYLASE in the last 168 hours. No results for input(s): AMMONIA in the last 168 hours. CBC: Recent Labs  Lab 10/04/18 2003 10/05/18 0538  WBC 11.0* 9.4  NEUTROABS 6.0  --   HGB 11.0* 10.5*  HCT 35.6* 34.6*  MCV 89.4 89.4  PLT 486* 479*   Cardiac Enzymes: No results for input(s): CKTOTAL, CKMB, CKMBINDEX, TROPONINI in the last 168 hours. BNP: Invalid input(s): POCBNP CBG: Recent Labs  Lab 10/05/18 1655 10/05/18  1801 10/05/18 2123 10/06/18 0824 10/06/18 1125  GLUCAP 40* 122* 112* 172* 256*   D-Dimer No results for input(s): DDIMER in the last 72 hours. Hgb A1c No results for input(s): HGBA1C in the last 72 hours. Lipid Profile No results for input(s): CHOL, HDL, LDLCALC, TRIG,  CHOLHDL, LDLDIRECT in the last 72 hours. Thyroid function studies No results for input(s): TSH, T4TOTAL, T3FREE, THYROIDAB in the last 72 hours.  Invalid input(s): FREET3 Anemia work up No results for input(s): VITAMINB12, FOLATE, FERRITIN, TIBC, IRON, RETICCTPCT in the last 72 hours. Urinalysis    Component Value Date/Time   COLORURINE YELLOW 08/04/2018 1253   APPEARANCEUR HAZY (A) 08/04/2018 1253   LABSPEC 1.013 08/04/2018 1253   PHURINE 5.0 08/04/2018 1253   GLUCOSEU 50 (A) 08/04/2018 1253   HGBUR SMALL (A) 08/04/2018 1253   BILIRUBINUR NEGATIVE 08/04/2018 1253   KETONESUR NEGATIVE 08/04/2018 1253   PROTEINUR >=300 (A) 08/04/2018 1253   NITRITE NEGATIVE 08/04/2018 1253   LEUKOCYTESUR SMALL (A) 08/04/2018 1253   Sepsis Labs Invalid input(s): PROCALCITONIN,  WBC,  LACTICIDVEN Microbiology No results found for this or any previous visit (from the past 240 hour(s)).  Time coordinating discharge:   SIGNED:  Irwin Brakeman, MD  [facility] 10/06/2018, 12:23 PM Pager [phone]  If 7PM-7AM, please contact night-coverage [email or web address] Password [email or web address]

## 2018-10-07 ENCOUNTER — Other Ambulatory Visit: Payer: Medicare Other

## 2018-10-07 ENCOUNTER — Other Ambulatory Visit: Payer: Self-pay | Admitting: *Deleted

## 2018-10-07 DIAGNOSIS — Z794 Long term (current) use of insulin: Principal | ICD-10-CM

## 2018-10-07 DIAGNOSIS — E118 Type 2 diabetes mellitus with unspecified complications: Secondary | ICD-10-CM

## 2018-10-07 DIAGNOSIS — E871 Hypo-osmolality and hyponatremia: Secondary | ICD-10-CM | POA: Diagnosis not present

## 2018-10-07 NOTE — Patient Outreach (Signed)
Outreach to pt for telephonic assessment, no answer to [phone] and message states "voicemail not set up", no answer to [phone] and no option to leave voicemail.  RN CM mailed unsuccessful outreach letter to patient's home.  PLAN Outreach pt in 3-4 business days  Jacqlyn Larsen [facility], [place] [phone]

## 2018-10-08 LAB — BASIC METABOLIC PANEL
BUN / CREAT RATIO: 35 (calc) — AB (ref 6–22)
BUN: 47 mg/dL — ABNORMAL HIGH (ref 7–25)
CO2: 29 mmol/L (ref 20–32)
Calcium: 9 mg/dL (ref 8.6–10.3)
Chloride: 91 mmol/L — ABNORMAL LOW (ref 98–110)
Creat: 1.33 mg/dL — ABNORMAL HIGH (ref 0.70–1.11)
Glucose, Bld: 250 mg/dL — ABNORMAL HIGH (ref 65–99)
Potassium: 5.6 mmol/L — ABNORMAL HIGH (ref 3.5–5.3)
SODIUM: 131 mmol/L — AB (ref 135–146)

## 2018-10-10 ENCOUNTER — Other Ambulatory Visit: Payer: Self-pay | Admitting: Pharmacy Technician

## 2018-10-10 ENCOUNTER — Other Ambulatory Visit: Payer: Self-pay | Admitting: *Deleted

## 2018-10-10 NOTE — Patient Outreach (Signed)
Outreach to pt for telephonic assessment/ 2nd attempt, no answer to telephone and received message " voicemail not set up"  [place] pt 3-4 business days  Jacqlyn Larsen [facility], [place] [phone]

## 2018-10-10 NOTE — Patient Outreach (Signed)
[facility]) Care Management  10/10/2018  Edward [name] 1934/10/27 [id number]    Follow up call placed to [facility] regarding patient assistance application(s) for Hart Carwin confirms patient has been approved as of 01/03 - 10/02/2019. She also started 4 boxes of medication was delivered to Dr. Antony Contras office on 1/8.     Successful call placed to patient regarding patient assistance update for Basaglar, HIPAA identifiers verified. Patient states that he has not received a phone call from providers office stating medication has arrived. Informed him I would contact provider to confirm.   Placed call to providers office and left message for Dr. Antony Contras nurse to contact me to confirm medication is at office.  Will follow up with patient in 2-3 business days if call from office has not been returned to verify office has contacted him.  [name] [place] Certified Pharmacy Technician [facility] Management Direct [phone]

## 2018-10-12 DIAGNOSIS — I5032 Chronic diastolic (congestive) heart failure: Secondary | ICD-10-CM

## 2018-10-15 ENCOUNTER — Other Ambulatory Visit: Payer: Self-pay | Admitting: *Deleted

## 2018-10-15 DIAGNOSIS — J441 Chronic obstructive pulmonary disease with (acute) exacerbation: Secondary | ICD-10-CM

## 2018-10-15 NOTE — Patient Outreach (Signed)
Outreach (third attempt) for telephonic assessment, spoke with pt, HIPAA verified, pt reports " I'm doing pretty good" states dyspnea has improved, reports taking medication as prescribed except does not have any inhalers on hand (has med for nebulizer) pt states he cannot afford inhalers and received samples of insulin from MD office, pt to see primary care 10/22/18, pt continues using oxygen 3 liters, pt states he " got some papers in mail to fill out about my medicine"  RN CM urged pt to complete the papers and send back in.  RN CM reviewed plan of care and will transfer to RN health coach,  Sent [facility] pharmacist update.  THN CM Care Plan Problem One     Most Recent Value  Care Plan Problem One  Knowledge deficit related to diabetes  Role Documenting the Problem One  Care Management [place] for Problem One  Active  THN Long Term Goal   Pt will show improvement in blood sugar (100-200's range) within 60 days  THN Long Term Goal Start Date  08/13/18  [facility] Long Term Goal Met Date  10/15/18  Interventions for Problem One Long Term Goal  Pt reports most all CBG readings in 100-200's range, pt checks once daily but does not record  THN CM Short Term Goal #1   Pt will verbalize examples of foods that are carbohydrates within 30 days  THN CM Short Term Goal #1 Start Date  08/13/18  THN CM Short Term Goal #1 Met Date  09/09/18  THN CM Short Term Goal #2   Pt will consistently check CBG daily and record in [facility] calendar within 30 days  THN CM Short Term Goal #2 Start Date  09/09/18 [goal restarted]  [facility] CM Short Term Goal #2 Met Date  10/15/18 [pt is consistently checking daily]  Interventions for Short Term Goal #2  Pt does not wish to record CBG readings and will no longer be working towards this goal.  Pt is checking CBG daily, praised and encouraged pt for this.    THN CM Care Plan Problem Two     Most Recent Value  Care Plan Problem Two  Knowledge deficit related to COPD  Role  Documenting the Problem Two  Care Management Coordinator  Care Plan for Problem Two  Active  THN CM Short Term Goal #1   Pt will verbalize COPD action plan within 30 days  THN CM Short Term Goal #1 Start Date  10/15/18 [goal re-established]  Interventions for Short Term Goal #2   RN CM reviewed COPD action plan with emphasis on yellow zone, pt needs ongoing reinforcement, reminders, pt reports he does not have any inhalers on hand, RN CM sent update to THNpharmacist [employer], pt states he cannot afford.      PLAN Transfer to RN health coach  Jacqlyn Larsen [facility], BSN [place] Coordinator [phone]

## 2018-10-16 ENCOUNTER — Other Ambulatory Visit: Payer: Self-pay

## 2018-10-16 ENCOUNTER — Telehealth: Payer: Self-pay | Admitting: Family Medicine

## 2018-10-16 NOTE — Telephone Encounter (Signed)
Pt was taking Trelegy and has been out for a while as it is too expensive for him until he meets his $600 deductible which will take him a while to meet. We do not have any samples right now and he has been out x 1 month. Per [name], D, RPH with [facility] she can get pt free inhalers but it has to be through [employer] or [employer]. Spi[place]to, [place], [place] are some that she can get. Please advise.

## 2018-10-16 NOTE — Telephone Encounter (Signed)
Per [name] if we can change is Trelegy we can use  - Dulera, Spiriva, Asmanex, Stiolto. These are the ones that he can get pt assistance to cover cost.

## 2018-10-16 NOTE — Patient Outreach (Addendum)
[place] [facility]) Care Management  [place]   10/16/2018  Edward [name] 24-May-1935 [id number]  Reason for referral: Medication Management  Referral source: [place]  PMHx includes but not limited to: COPD, T2DM, HTN, HLD  Outreach:  Successful telephone call and home visit with Edward [name]. HIPAA identifiers verified.   Subjective:  Patient reports he cannot afford Trelegy inhaler ($142 at [facility]). Patient's son reports he picked up Edward [name]'s Basaglar supply from PCP's office on Monday 1/13 (obtained via medication assistance from [place]). Upon attempt to review medications telephonically, patient was unable to read names and strengths, and was agreeable to a home visit today.   Medications were reviewed and list has been updated. Patient reports he has been out of his inhaler for about a month, and of note recently had a hospitalization for COPD exacerbation. He still uses his albuterol nebulizer every 6 hours. Patient has completed courses of prednisone and doxycycline, so pill bottles were removed from his medication supply. Patient reports taking furosemide 40mg  daily, but the pill bottle says every other day. Upon [facility] review, Dr. Dennard Schaumann directed him to take daily at appointment on 08/23/2018.    Objective: Medications Reviewed Today    Reviewed by Dionne Milo, [facility] (Pharmacist) on 10/16/18 at 1148  Med List Status: <None>  Medication Order Taking? Sig Documenting Provider Last Dose Status Informant  albuterol (PROVENTIL) (2.5 MG/3ML) 0.083% nebulizer solution [id number]  INHALE 1 VIAL VIA NEBULIZER EVERY 6 HOURS AS NEEDED FOR WHEEZING OR SHORTNESS OF BREATH  Patient taking differently:  Take 2.5 mg by nebulization every 6 (six) hours as needed for wheezing or shortness of breath.    Orlena Sheldon, PA-C  Active Self           Med Note Luana Shu, MONCHELL R   Fri Aug 02, 2018 12:30 PM) Lf 10.30.19  apixaban  (ELIQUIS) 2.5 MG TABS tablet [id number] Yes Take 1 tablet (2.5 mg total) by mouth 2 (two) times daily. Restart on 7/24 Kathie Dike, MD Taking Active Self           Med Note (MENDENHALL, [name]   Tue Sep 10, 2018 10:32 AM) He gets this medication from the drug company patient assistance program.   aspirin EC 81 MG tablet [id number] Yes Take 1 tablet (81 mg total) by mouth daily. Resume on 7/24 Kathie Dike, MD Taking Active Self  cloNIDine (CATAPRES) 0.1 MG tablet [id number] Yes Take 1 tablet (0.1 mg total) by mouth 2 (two) times daily. Susy Frizzle, MD Taking Active Self  diltiazem (CARDIZEM CD) 240 MG 24 hr capsule [id number] Yes Take 1 capsule (240 mg total) by mouth daily. Susy Frizzle, MD Taking Active Self  Fluticasone-Umeclidin-Vilant (TRELEGY ELLIPTA) 100-62.5-25 MCG/INH AEPB [id number] No Inhale 1 Inhaler into the lungs daily.  Patient not taking:  Reported on 10/16/2018   Susy Frizzle, MD Not Taking Active Self  furosemide (LASIX) 40 MG tablet [id number] Yes Take 1 tablet (40 mg total) by mouth every other day. Takes with Potassium 20MEQ. First dose of Lasix 11/6.  Patient taking differently:  Take 40 mg by mouth daily. Takes with Potassium 20MEQ. First dose of Lasix 11/6.   [name], MD Taking Active   hydrocortisone (ANUSOL-HC) 25 MG suppository [id number] No Place 1 suppository (25 mg total) rectally 2 (two) times daily as needed for hemorrhoids or anal itching.  Patient not taking:  Reported on 10/16/2018   Murray Hodgkins  P, MD Not Taking Active Self  Insulin Glargine (BASAGLAR KWIKPEN) 100 UNIT/ML SOPN [id number] Yes Inject 20 Units into the skin 2 (two) times daily. [provider] Taking Active Self  metoprolol tartrate (LOPRESSOR) 25 MG tablet [id number] Yes TAKE 1 TABLET BY MOUTH TWICE DAILY Susy Frizzle, MD Taking Active Self  ONE TOUCH ULTRA TEST test strip [id number] Yes  [provider] Taking Active Self  [facility]  LANCETS 29H MISC [id number] Yes USE TO CHECK BLOOD SUGAR TWICE DAILY AS DIRECTED Orlena Sheldon, PA-C Taking Active Self  OXYGEN [id number] Yes Inhale 3 L into the lungs continuous.  [provider] Taking Active Self  pantoprazole (PROTONIX) 40 MG tablet [id number]  Take 1 tablet (40 mg total) by mouth 2 (two) times daily before a meal. Kathie Dike, MD  Active Self           Med Note Luana Shu, MONCHELL R   Fri Aug 02, 2018 12:28 PM) No pharmacy record   polyethylene glycol (MIRALAX / GLYCOLAX) packet [id number] Yes Take 17 g by mouth daily as needed for mild constipation or moderate constipation. [name], MD Taking Active   potassium chloride SA (K-DUR,KLOR-CON) 20 MEQ tablet [id number] Yes Take 1 tablet (20 mEq total) by mouth daily. When he takes lasix [name], MD Taking Active   pravastatin (PRAVACHOL) 80 MG tablet [id number] Yes TAKE 1 TABLET BY MOUTH AT BEDTIME Susy Frizzle, MD Taking Active Self        Discontinued 10/16/18 1148 (Completed Course)   Med List Note [name] 09/22/17 2323): Patient states that his pharmacy is [place]: Date Discharged from [facility]: 10/06/18 Date Medication Reconciliation Performed: 10/16/17  New Medications at Discharge:  Prednisone-completed  Doxycycline-completed  Assessment:  Drugs sorted by system:  Cardiovascular: apixaban,aspirin 81 mg, clonidine, diltiazem,fuosemide, metoprolol tartrate, potassium chloride, pravastatin  Pulmonary/Allergy: albuterol neb, fluticasone/umeclidin/vilant  Gastrointestinal: pantoprazole, polyethylene glycol  Endocrine: insulin glargine  Topical: hydrocortisone suppository  Medication Review Findings:  - Trelegy- patient states he is unable to afford. Care coordination call placed to PCP office to inquire about potential options for treatment. Nurse Lovey Newcomer at Dr. Samella Parr office) reports they do not have any Trelegy samples at the  office. Informed Edward [name] that [facility] + Spiriva or Stiolto + Asmanex are available options of LABA/LAMA/ICS inhalers from drug companies with no OOP requirement to replace Trelegy. Nurse reports she will follow up with Dr. Dennard Schaumann.  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 Call placed to [name] to inquire about stability. Representative reports that medication will expire after 28 days, even with medication back in the fridge. Call placed to [facility]; they will send another supply of Basaglar pens to Edward [name]'s doctor's office. Call placed to patient to inform him of new Basaglar supply being shipped to his doctor's office and counseled him to keep it all in the refrigerator when he picks it up.  Call back to [facility] at Dr. Samella Parr office to inform her of new [place] shipment.  Medication Assistance Findings:  We are aware that he will need assistance with Eliquis and Trelegy after he meets the OOP, but patient is not eligible at this time.  He needs to spend 3% of household income to apply for Eliquis and $600 to apply for Trelegy.  He is aware that he needs to contact [place] when he is close to  his out of pocket requirements and we will get the application process going for him.  Plan: Will follow-up in a few weeks with a home visit to remove expired insulin.  [place] Faster PharmD Candidate Class of 2020  [employer] [place] [phone]

## 2018-10-17 ENCOUNTER — Other Ambulatory Visit (HOSPITAL_COMMUNITY): Payer: Medicare Other

## 2018-10-17 NOTE — Telephone Encounter (Signed)
Switch Trelegy to spiriva respimat 2 inhalations a day and dulera 200/5, 2 puffs bid

## 2018-10-18 MED ORDER — MOMETASONE FURO-FORMOTEROL FUM 200-5 MCG/ACT IN AERO: 2.0000 | INHALATION_SPRAY | Freq: Two times a day (BID) | RESPIRATORY_TRACT | 5 refills | Status: DC

## 2018-10-18 MED ORDER — TIOTROPIUM BROMIDE MONOHYDRATE 18 MCG IN CAPS: 18.0000 ug | ORAL_CAPSULE | Freq: Every day | RESPIRATORY_TRACT | 5 refills | Status: DC

## 2018-10-18 NOTE — Telephone Encounter (Signed)
Med sent to pharmacy.

## 2018-10-22 ENCOUNTER — Encounter: Payer: Self-pay | Admitting: Family Medicine

## 2018-10-22 ENCOUNTER — Ambulatory Visit (INDEPENDENT_AMBULATORY_CARE_PROVIDER_SITE_OTHER): Payer: Medicare Other | Admitting: Family Medicine

## 2018-10-22 VITALS — BP 136/70 | HR 60 | Temp 98.0°F | Resp 22 | Ht 65.0 in | Wt 184.0 lb

## 2018-10-22 DIAGNOSIS — E871 Hypo-osmolality and hyponatremia: Secondary | ICD-10-CM

## 2018-10-22 DIAGNOSIS — N183 Chronic kidney disease, stage 3 unspecified: Secondary | ICD-10-CM

## 2018-10-22 DIAGNOSIS — I5032 Chronic diastolic (congestive) heart failure: Secondary | ICD-10-CM

## 2018-10-22 DIAGNOSIS — J449 Chronic obstructive pulmonary disease, unspecified: Secondary | ICD-10-CM

## 2018-10-22 DIAGNOSIS — Z09 Encounter for follow-up examination after completed treatment for conditions other than malignant neoplasm: Secondary | ICD-10-CM

## 2018-10-22 DIAGNOSIS — D500 Iron deficiency anemia secondary to blood loss (chronic): Secondary | ICD-10-CM | POA: Diagnosis not present

## 2018-10-22 LAB — CBC WITH DIFFERENTIAL/PLATELET
Absolute Monocytes: 562 cells/uL (ref 200–950)
Basophils Absolute: 52 cells/uL (ref 0–200)
Basophils Relative: 0.7 %
Eosinophils Absolute: 747 cells/uL — ABNORMAL HIGH (ref 15–500)
Eosinophils Relative: 10.1 %
HCT: 33.1 % — ABNORMAL LOW (ref 38.5–50.0)
Hemoglobin: 10.7 g/dL — ABNORMAL LOW (ref 13.2–17.1)
Lymphs Abs: 1399 cells/uL (ref 850–3900)
MCH: 27.6 pg (ref 27.0–33.0)
MCHC: 32.3 g/dL (ref 32.0–36.0)
MCV: 85.5 fL (ref 80.0–100.0)
MONOS PCT: 7.6 %
MPV: 10.4 fL (ref 7.5–12.5)
Neutro Abs: 4640 cells/uL (ref 1500–7800)
Neutrophils Relative %: 62.7 %
Platelets: 265 10*3/uL (ref 140–400)
RBC: 3.87 10*6/uL — ABNORMAL LOW (ref 4.20–5.80)
RDW: 13.3 % (ref 11.0–15.0)
Total Lymphocyte: 18.9 %
WBC: 7.4 10*3/uL (ref 3.8–10.8)

## 2018-10-22 LAB — COMPLETE METABOLIC PANEL WITH GFR
AG Ratio: 1.2 (calc) (ref 1.0–2.5)
ALT: 15 U/L (ref 9–46)
AST: 15 U/L (ref 10–35)
Albumin: 3 g/dL — ABNORMAL LOW (ref 3.6–5.1)
Alkaline phosphatase (APISO): 89 U/L (ref 40–115)
BUN: 20 mg/dL (ref 7–25)
CO2: 34 mmol/L — ABNORMAL HIGH (ref 20–32)
Calcium: 8.8 mg/dL (ref 8.6–10.3)
Chloride: 92 mmol/L — ABNORMAL LOW (ref 98–110)
Creat: 1.06 mg/dL (ref 0.70–1.11)
GFR, Est African American: 76 mL/min/{1.73_m2} (ref >=60)
GFR, Est Non African American: 65 mL/min/{1.73_m2} (ref >=60)
Globulin: 2.5 g/dL (calc) (ref 1.9–3.7)
Glucose, Bld: 177 mg/dL — ABNORMAL HIGH (ref 65–99)
Potassium: 4.6 mmol/L (ref 3.5–5.3)
Sodium: 133 mmol/L — ABNORMAL LOW (ref 135–146)
Total Bilirubin: 0.2 mg/dL (ref 0.2–1.2)
Total Protein: 5.5 g/dL — ABNORMAL LOW (ref 6.1–8.1)

## 2018-10-22 MED ORDER — METHYLPREDNISOLONE ACETATE 80 MG/ML IJ SUSP
80.0000 mg | Freq: Once | INTRAMUSCULAR | Status: AC
  Administered 2018-10-22: 80 mg via INTRAMUSCULAR

## 2018-10-22 MED ORDER — TIOTROPIUM BROMIDE-OLODATEROL 2.5-2.5 MCG/ACT IN AERS: 2.0000 | INHALATION_SPRAY | Freq: Every day | RESPIRATORY_TRACT | 11 refills | Status: DC

## 2018-10-22 MED ORDER — PREDNISONE 20 MG PO TABS: 40.0000 mg | ORAL_TABLET | Freq: Every day | ORAL | 0 refills | Status: DC

## 2018-10-22 NOTE — Progress Notes (Signed)
Subjective:    Patient ID: Edward [name], male    DOB: 12-29-1934, 84 y.o.   MRN: [id number]  HPI Patient was recently admitted to the hospital.  I have copied relevant portions of the discharge summary and included them below for my reference.  Admit date: 10/04/2018 Discharge date: 10/06/2018  Admitted From: Home  Disposition: Home   Recommendations for Outpatient Follow-up:  1. Follow up with PCP in  3-5 days for recheck.   Discharge Condition: STABLE  CODE STATUS: FULL    Brief Hospitalization Summary: Please see all hospital notes, images, labs for full details of the hospitalization. HPI:Edward W Championis a 84 y.o.malewith medical history significant ofallergy, PAF (atrial fibrillation), COPD, diabetes mellitus, hyperlipidemia, hypertension, iron deficiency anemia, vitamin D deficiency whois coming to the emergency department with complaints of progressively worse dyspnea with wheezing, fatigue, productive cough of whitish sputum since before Christmas. He denies fever, chills, sore throat or rhinorrhea. He denies hemoptysis, pleuritic chest pain, palpitations, dizziness, diaphoresis, PND, orthopnea. He occasionally gets pitting edema of the lower extremities. No abdominal pain, nausea, emesis, diarrhea, constipation, melena, but occasionally has trouble with hemorrhoids, which usually resolves with HC suppositories. No dysuria, frequency or hematuria. No polyuria, polydipsia, polyphagia or blurred vision. Denies pruritus or skin rashes.  ED Course:Initial vital signs pulse 92, respirations 28, blood pressure 163/80 mmHg O2 sat 96% on nasal cannula oxygen. The patient received Solu-Medrol 125 mg IVP and 2 DuoNebs in the emergency department.  White count was 11.0 with 54% neutrophils, 16% lymphocytes, 8% monocytes and 19% eosinophils. Hemoglobin was 11.0 g/dL and platelets 486. Influenza a and B by PCR was negative. Troponin was normal. BNP was 71.0 pg/mL. CMP  shows a sodium 129 and chloride of 88 mmol/L. All other electrolytes are within normal limits when calcium is corrected to albumin low 2.8 g/dL. BUN is 31, creatinine 1.23 and glucose 247 mg/dL. Alkaline phosphatase is slightly elevated at 155 units/L, transaminases and bilirubin are within normal limits. Calcium was 2.3 and phosphorus 5.2 mg/dL. His chest radiograph did not have any acute cardiopulmonary pathology.  COPD exacerbation ([place]) Pt says he feels much better and wants to go home, he is breathing better, he is ambulating in room and he has no SOB or chest pain.   Diabetes mellitus type 2, uncontrolled Carbohydrate modified diet. Resume home treatment plan and follow CBG closely while on steroids.   Hypertension Continue Cardizem CD 240 mgp.o. daily. Continue clonidine 0.1 mg p.o. twice daily. Continue metoprolol 25 mg p.o. twice daily.  Hypercholesterolemia Continue pravastatin 80 mg po daily. LFTs and fasting lipids follow-up as an outpatient.  Chronic diastolic CHF (congestive heart failure) No decompensationfrom this at this time. No edema. No elevated BNP.Holding lasix 1-2 days for low Na, looks dry.  AF (paroxysmal atrial fibrillation) CHA?DS?-VASc Scoreof at least 5. Continue Cardizem CD and metoprolol for rate control. Continue apixaban for anticoagulation.  CKD (chronic kidney disease), stage III  Monitor renal function and electrolytes.Creat 1.2.  Iron deficiency anemia due to chronic blood loss Monitor hematocrit and hemoglobin.Hb 10.5 here.  Hyponatremia: Na 129 here Secondaryto diuretic use.Improved.    DVT prophylaxis:On Apixaban. Code Status:Full code. Family Communication:no family here Disposition Plan:Home   10/22/18 Here today for follow-up.  Recently admitted with COPD exacerbation.  I had started the patient on Trelegy however his insurance would not cover the medication and therefore he  discontinued it.  As a result with a recent upper respiratory infection, he was mated with  COPD exacerbation.  Patient states that he had been off the Trelegy about 3 weeks before he got sick.  He developed an upper respiratory infection and is breathing decompensated forcing him to go to the emergency room.  At the present time we are trying to get him financial assistance for his medication however he is unable to afford the Trelegy and is not currently on any maintenance medication for his COPD.  I do have samples today of Stiolto.  His breathing today is worse than his baseline.  He is audibly wheezing on exam.  He has an elevated respiratory rate.  He has diffuse expiratory wheezes in all 4 lung fields.  He has diminished breath sounds bilaterally.  He has rhonchorous breath sounds bilaterally.  He states that he is using his albuterol nebulizer 4-6 times a day.  This is the only thing he is doing right now to help his breathing.  He is wearing his oxygen.    He was also admitted and felt to be dehydrated with hyponatremia.  Therefore they held his Lasix for 1 to 2 days in the hospital.  He is due to monitor this as well  Past Medical History:  Diagnosis Date  . Allergy    Rhinitis  . Atrial fibrillation ([place])   . Bronchitis   . Chronic respiratory failure ([place])   . Colon polyps   . COPD (chronic obstructive pulmonary disease) ([place])   . Diabetes mellitus   . Elevated lipids   . Hypercholesterolemia   . Hypertension   . Iron deficiency anemia due to chronic blood loss 04/30/2018  . Noncompliance   . On home O2    2L N/C   . PSA elevation   . Pulmonary fibrosis ([place])   . Vitamin D deficiency    Past Surgical History:  Procedure Laterality Date  . BIOPSY  04/23/2018   Procedure: BIOPSY;  Surgeon: Danie Binder, MD;  Location: [facility] ENDO SUITE;  Service: Endoscopy;;  duodenum gastric  . CATARACT EXTRACTION W/PHACO  06/25/2012   Procedure: CATARACT EXTRACTION PHACO AND INTRAOCULAR LENS  PLACEMENT (IOC);  Surgeon: Elta Guadeloupe T. Gershon Crane, MD;  Location: [facility] ORS;  Service: Ophthalmology;  Laterality: Left;  CDE=19.01  . CATARACT EXTRACTION W/PHACO  07/09/2012   Procedure: CATARACT EXTRACTION PHACO AND INTRAOCULAR LENS PLACEMENT (IOC);  Surgeon: Elta Guadeloupe T. Gershon Crane, MD;  Location: [facility] ORS;  Service: Ophthalmology;  Laterality: Right;  CDE: 20.09  . COLONOSCOPY WITH PROPOFOL N/A 04/23/2018   Procedure: COLONOSCOPY WITH PROPOFOL;  Surgeon: Danie Binder, MD;  Location: [facility] END[facility] SUITE;  Service: Endoscopy;  Laterality: N/A;  . ESOPHAGOGASTRODUODENOSCOPY (EGD) WITH PROPOFOL N/A 04/23/2018   Procedure: ESOPHAGOGASTRODUODENOSCOPY (EGD) WITH PROPOFOL;  Surgeon: Danie Binder, MD;  Location: [facility];  Service: Endoscopy;  Laterality: N/A;   Current Outpatient Medications on File Prior to Visit  Medication Sig Dispense Refill  . albuterol (PROVENTIL) (2.5 MG/3ML) 0.083% nebulizer solution INHALE 1 VIAL VIA NEBULIZER EVERY 6 HOURS AS NEEDED FOR WHEEZING OR SHORTNESS OF BREATH (Patient taking differently: Take 2.5 mg by nebulization every 6 (six) hours as needed for wheezing or shortness of breath. ) 360 mL 4  . apixaban (ELIQUIS) 2.5 MG TABS tablet Take 1 tablet (2.5 mg total) by mouth 2 (two) times daily. Restart on 7/24 120 tablet 0  . aspirin EC 81 MG tablet Take 1 tablet (81 mg total) by mouth daily. Resume on 7/24    . cloNIDine (CATAPRES) 0.1 MG tablet Take 1 tablet (0.1  mg total) by mouth 2 (two) times daily. 60 tablet 3  . diltiazem (CARDIZEM CD) 240 MG 24 hr capsule Take 1 capsule (240 mg total) by mouth daily. 30 capsule 2  . furosemide (LASIX) 40 MG tablet Take 1 tablet (40 mg total) by mouth every other day. Takes with Potassium 20MEQ. First dose of Lasix 11/6. (Patient taking differently: Take 40 mg by mouth daily. Takes with Potassium 20MEQ. First dose of Lasix 11/6.) 30 tablet   . Insulin Glargine (BASAGLAR KWIKPEN) 100 UNIT/ML SOPN Inject 20 Units into the skin 2 (two) times daily.    .  metoprolol tartrate (LOPRESSOR) 25 MG tablet TAKE 1 TABLET BY MOUTH TWICE DAILY 60 tablet 3  . ONE TOUCH ULTRA TEST test strip     . ONETOUCH DELICA LANCETS 96G MISC USE TO CHECK BLOOD SUGAR TWICE DAILY AS DIRECTED 100 each 2  . OXYGEN Inhale 3 L into the lungs continuous.     . polyethylene glycol (MIRALAX / GLYCOLAX) packet Take 17 g by mouth daily as needed for mild constipation or moderate constipation.    . potassium chloride SA (K-DUR,KLOR-CON) 20 MEQ tablet Take 1 tablet (20 mEq total) by mouth daily. When he takes lasix 30 tablet 3  . pravastatin (PRAVACHOL) 80 MG tablet TAKE 1 TABLET BY MOUTH AT BEDTIME 30 tablet 0  . mometasone-formoterol (DULERA) 200-5 MCG/ACT AERO Inhale 2 puffs into the lungs 2 (two) times daily. (Patient not taking: Reported on 10/22/2018) 13 g 5  . tiotropium (SPIRIVA) 18 MCG inhalation capsule Place 1 capsule (18 mcg total) into inhaler and inhale daily. (Patient not taking: Reported on 10/22/2018) 30 capsule 5   No current facility-administered medications on file prior to visit.    Allergies  Allergen Reactions  . Ace Inhibitors Other (See Comments)    Hyperkalemia--07/23/2013:patient states not familiar with the following allergy   Social History   Socioeconomic History  . Marital status: Married    Spouse name: Not on file  . Number of children: Not on file  . Years of education: Not on file  . Highest education level: Not on file  Occupational History  . 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 Financial resource strain: Not on file  . Food insecurity:    Worry: Not on file    Inability: Not on file  . Transportation needs:    Medical: Not on file    Non-medical: Not on file  Tobacco Use  . Smoking status: Former Smoker    Packs/day: 1.50    Years: 60.00    Pack years: 90.00    Types: Cigarettes    Last attempt to quit: 12/31/2012    Years since quitting: 5.8  . Smokeless tobacco: Never Used  Substance and  Sexual Activity  . Alcohol use: No  . Drug use: No  . Sexual activity: Yes    Birth control/protection: None  Lifestyle  . Physical activity:    Days per week: Not on file    Minutes per session: Not on file  . Stress: Not on file  Relationships  . Social connections:    Talks on phone: Not on file    Gets together: Not on file    Attends religious service: Not on file    Active member of club or organization: Not on file    Attends meetings of clubs or organizations: Not on file    Relationship status: Not on file  . Intimate  partner violence:    Fear of current or ex partner: Not on file    Emotionally abused: Not on file    Physically abused: Not on file    Forced sexual activity: Not on file  Other Topics Concern  . Not on file  Social History Narrative  . Not on file     Review of Systems  All other systems reviewed and are negative.      Objective:   Physical Exam Vitals signs reviewed.  Constitutional:      General: He is not in acute distress.    Appearance: Normal appearance. He is obese. He is not ill-appearing, toxic-appearing or diaphoretic.  HENT:     Nose: Nose normal. No congestion or rhinorrhea.  Cardiovascular:     Rate and Rhythm: Normal rate and regular rhythm.     Heart sounds: Normal heart sounds. No murmur. No gallop.   Pulmonary:     Effort: No respiratory distress.     Breath sounds: No stridor. Wheezing and rhonchi present. No rales.  Musculoskeletal:     Right lower leg: No edema.     Left lower leg: No edema.  Neurological:     Mental Status: He is alert.           Assessment & Plan:  Hospital discharge follow-up - Plan: CBC with Differential/Platelet, COMPLETE METABOLIC PANEL WITH GFR  Chronic obstructive pulmonary disease, unspecified COPD type ([place]) - Plan: CBC with Differential/Platelet, COMPLETE METABOLIC PANEL WITH GFR  Chronic diastolic heart failure ([place]) - Plan: CBC with Differential/Platelet, COMPLETE METABOLIC  PANEL WITH GFR  CKD (chronic kidney disease), stage III ([place]) - Plan: CBC with Differential/Platelet, COMPLETE METABOLIC PANEL WITH GFR  Hyponatremia - Plan: CBC with Differential/Platelet, COMPLETE METABOLIC PANEL WITH GFR  Iron deficiency anemia due to chronic blood loss - Plan: CBC with Differential/Platelet, COMPLETE METABOLIC PANEL WITH GFR  Patient appears to be falling back into a COPD exacerbation secondary to medication noncompliance due to cost.  Ideally I like this patient to be on triple therapy in some form however he is unable to afford any of the long-acting bronchodilators.  However we have samples of Stiolto.  Therefore I will start him on 2 inhalations a day of this.  We also try to get him financial assistance with this through the pharmacy program.  Although not ideal this would be better than doing nothing which is ultimately what led to his hospitalization.  I will treat his current exacerbation with Depo-Medrol 80 mg IM x1 now and then begin prednisone 40 mg a day for 7 days thereafter.  Recheck the patient on Friday or sooner if getting worse.  Given his hyponatremia, I will repeat a CMP and I will also monitor his anemia.

## 2018-10-22 NOTE — Addendum Note (Signed)
Addended by: Shary Decamp B on: 10/22/2018 11:53 AM   Modules accepted: Orders

## 2018-10-24 ENCOUNTER — Other Ambulatory Visit: Payer: Self-pay | Admitting: Pharmacy Technician

## 2018-10-24 ENCOUNTER — Ambulatory Visit (HOSPITAL_COMMUNITY): Payer: Medicare Other | Admitting: Hematology

## 2018-10-24 ENCOUNTER — Other Ambulatory Visit: Payer: Self-pay

## 2018-10-24 ENCOUNTER — Ambulatory Visit: Payer: Self-pay

## 2018-10-24 NOTE — Patient Outreach (Addendum)
[name]) Care Management  [place]  10/24/2018  Edward [name] Aug 17, 1935 [id number]  Reason for call: medication assitance  Unsuccessful telephone call attempt #1 to patient.   Unable to leave message because voice mail is not set up  Medication Assistance: In-basket message received from [employer] at Dr. Samella [name] office.  PCP has authorized switch from Trelegy (unable to afford) to [facility] 200/5 2 puffs twice daily and Spiriva Respimat 2 puffs daily. Will send Edward [name] patient assistance applications for these medications that we can help obtain without a required out of pocket expenditure by the patient.  Plan:  I will make another outreach attempt to patient within 3-4 business days to inform him to expect these applications.   Edward [name], PharmD Clinical Pharmacist [place] [phone]  Addendum:  Successful outreach attempt to Edward [name].  HIPAA identifier verified.  Informed Edward [name] that Dr.[name] had approved therapeutic substitutions for his Trelegy inhaler, since he was having troubled affording it.  He verbalized understanding that he will receive two applications in the mail and he is aware that he can call me if he needs assistance fiilling out the forms. He states he is on his last bottle of Eliquis.  Informed him he will need to request samples from his doctors until he has spent 3% of household income to be eligible for patient assistance.  Edward [name] reports that he has not gotten a call to pick up his replacement insulin from Dr. Samella [name] office.  I will make a home visit once new insulin is received to ensure that the expired, unrefrigerated insulin is removed from the home.  Edward [name], wife and son are aware to keep all unopened insulin in the refrigerator.     Plan: Outreach call on 1/30 to schedule home visit.  Edward [name], PharmD Clinical Pharmacist [place] [phone]  Addendum: Edward [name] message received from Edward [name] at Dr. Samella [name] office.  She states his replacement insulin is at their office and she will check with drug rep to see if she can get him some Eliquis samples.  Edward [name] reports he was also given Ensure because he is malnourished.  Call placed to Edward [name] and spoke with son, Edward [name].  He states that Mr. Edward [name] has an appointment tomorrow afternoon with Dr. Dennard [name] and he will pick up the insulin.  He states that his dad is drinking his Ensure as directed.  Counseled again on the refrigeration requirement of unopened insulin.   Edward [name], PharmD Clinical Pharmacist [place] [phone]

## 2018-10-24 NOTE — Patient Outreach (Signed)
[facility]) Care Management  10/24/2018  Edward [name] 11-Sep-1935 [id number]                                                  Medication Assistance Referral  Referral From: [facility] RPh Clearnce Sorrel.  Medication/Company: [employer]ann [employer] Respimat / [name] Patient application portion:  Mailed Provider application portion: Faxed  to Dr. Dennard Schaumann  Medication/Company: Ruthe Mannan  / [employer] Patient application portion:  Mailed Provider application portion: Interoffice Mailed to Dr. Dennard Schaumann   Follow up:  Will follow up with patient in 5-7 business days to confirm application(s) have been received.  Edward [name] [place] Certified Pharmacy Technician [place] Management Direct Dial:[phone]

## 2018-10-25 ENCOUNTER — Encounter: Payer: Self-pay | Admitting: Family Medicine

## 2018-10-25 ENCOUNTER — Ambulatory Visit (INDEPENDENT_AMBULATORY_CARE_PROVIDER_SITE_OTHER): Payer: Medicare Other | Admitting: Family Medicine

## 2018-10-25 VITALS — BP 162/68 | HR 82 | Temp 98.3°F | Resp 20 | Ht 65.0 in | Wt 182.0 lb

## 2018-10-25 DIAGNOSIS — J441 Chronic obstructive pulmonary disease with (acute) exacerbation: Secondary | ICD-10-CM

## 2018-10-25 NOTE — Progress Notes (Signed)
Subjective:    Patient ID: Edward [name], male    DOB: Sep 24, 1935, 84 y.o.   MRN: [id number]  HPI Patient was recently admitted to the hospital.  I have copied relevant portions of the discharge summary and included them below for my reference.  Admit date: 10/04/2018 Discharge date: 10/06/2018  Admitted From: Home  Disposition: Home   Recommendations for Outpatient Follow-up:  1. Follow up with PCP in  3-5 days for recheck.   Discharge Condition: STABLE  CODE STATUS: FULL    Brief Hospitalization Summary: Please see all hospital notes, images, labs for full details of the hospitalization. HPI:Edward [name] a 84 y.o.malewith medical history significant ofallergy, PAF (atrial fibrillation), COPD, diabetes mellitus, hyperlipidemia, hypertension, iron deficiency anemia, vitamin D deficiency whois coming to the emergency department with complaints of progressively worse dyspnea with wheezing, fatigue, productive cough of whitish sputum since before Christmas. He denies fever, chills, sore throat or rhinorrhea. He denies hemoptysis, pleuritic chest pain, palpitations, dizziness, diaphoresis, PND, orthopnea. He occasionally gets pitting edema of the lower extremities. No abdominal pain, nausea, emesis, diarrhea, constipation, melena, but occasionally has trouble with hemorrhoids, which usually resolves with HC suppositories. No dysuria, frequency or hematuria. No polyuria, polydipsia, polyphagia or blurred vision. Denies pruritus or skin rashes.  ED Course:Initial vital signs pulse 92, respirations 28, blood pressure 163/80 mmHg O2 sat 96% on nasal cannula oxygen. The patient received Solu-Medrol 125 mg IVP and 2 DuoNebs in the emergency department.  White count was 11.0 with 54% neutrophils, 16% lymphocytes, 8% monocytes and 19% eosinophils. Hemoglobin was 11.0 g/dL and platelets 486. Influenza a and B by PCR was negative. Troponin was normal. BNP was 71.0 pg/mL. CMP  shows a sodium 129 and chloride of 88 mmol/L. All other electrolytes are within normal limits when calcium is corrected to albumin low 2.8 g/dL. BUN is 31, creatinine 1.23 and glucose 247 mg/dL. Alkaline phosphatase is slightly elevated at 155 units/L, transaminases and bilirubin are within normal limits. Calcium was 2.3 and phosphorus 5.2 mg/dL. His chest radiograph did not have any acute cardiopulmonary pathology.  COPD exacerbation ([place]) Pt says he feels much better and wants to go home, he is breathing better, he is ambulating in room and he has no SOB or chest pain.   Diabetes mellitus type 2, uncontrolled Carbohydrate modified diet. Resume home treatment plan and follow CBG closely while on steroids.   Hypertension Continue Cardizem CD 240 mgp.o. daily. Continue clonidine 0.1 mg p.o. twice daily. Continue metoprolol 25 mg p.o. twice daily.  Hypercholesterolemia Continue pravastatin 80 mg po daily. LFTs and fasting lipids follow-up as an outpatient.  Chronic diastolic CHF (congestive heart failure) No decompensationfrom this at this time. No edema. No elevated BNP.Holding lasix 1-2 days for low Na, looks dry.  AF (paroxysmal atrial fibrillation) CHA?DS?-VASc Scoreof at least 5. Continue Cardizem CD and metoprolol for rate control. Continue apixaban for anticoagulation.  CKD (chronic kidney disease), stage III  Monitor renal function and electrolytes.Creat 1.2.  Iron deficiency anemia due to chronic blood loss Monitor hematocrit and hemoglobin.Hb 10.5 here.  Hyponatremia: Na 129 here Secondaryto diuretic use.Improved.    DVT prophylaxis:On Apixaban. Code Status:Full code. Family Communication:no family here Disposition Plan:Home   10/22/18 Here today for follow-up.  Recently admitted with COPD exacerbation.  I had started the patient on Trelegy however his insurance would not cover the medication and therefore he  discontinued it.  As a result with a recent upper respiratory infection, he was mated with  COPD exacerbation.  Patient states that he had been off the Trelegy about 3 weeks before he got sick.  He developed an upper respiratory infection and is breathing decompensated forcing him to go to the emergency room.  At the present time we are trying to get him financial assistance for his medication however he is unable to afford the Trelegy and is not currently on any maintenance medication for his COPD.  I do have samples today of Stiolto.  His breathing today is worse than his baseline.  He is audibly wheezing on exam.  He has an elevated respiratory rate.  He has diffuse expiratory wheezes in all 4 lung fields.  He has diminished breath sounds bilaterally.  He has rhonchorous breath sounds bilaterally.  He states that he is using his albuterol nebulizer 4-6 times a day.  This is the only thing he is doing right now to help his breathing.  He is wearing his oxygen.    He was also admitted and felt to be dehydrated with hyponatremia.  Therefore they held his Lasix for 1 to 2 days in the hospital.  He is due to monitor this as well.  AT that time, my plan was: Patient appears to be falling back into a COPD exacerbation secondary to medication noncompliance due to cost.  Ideally I like this patient to be on triple therapy in some form however he is unable to afford any of the long-acting bronchodilators.  However we have samples of Stiolto.  Therefore I will start him on 2 inhalations a day of this.  We also try to get him financial assistance with this through the pharmacy program.  Although not ideal this would be better than doing nothing which is ultimately what led to his hospitalization.  I will treat his current exacerbation with Depo-Medrol 80 mg IM x1 now and then begin prednisone 40 mg a day for 7 days thereafter.  Recheck the patient on Friday or sooner if getting worse.  Given his hyponatremia, I will repeat  a CMP and I will also monitor his anemia.  10/25/18 Patient is here today for follow-up.  His breathing is much better.  He is on day 3 of prednisone.  Today his wheezing has improved dramatically.  His shortness of breath has improved.  He is no longer working hard to breathe.  Unfortunately, the patient is now doing Chartered loss adjuster.  Apparently his family member about Trelegy for him and he is doing that once a day.  He is also doing Stiolto twice a day.  Spent more than 15 minutes with the patient explaining these medications to him.  He is not supposed to do both of these medications.  I started him on Stiolto only to replace the Trelegy which he was unable to afford.  However now that he has Trelegy I would like him to use only Trelegy and not the Stiolto.  Past Medical History:  Diagnosis Date  . Allergy    Rhinitis  . Atrial fibrillation ([place])   . Bronchitis   . Chronic respiratory failure ([place])   . Colon polyps   . COPD (chronic obstructive pulmonary disease) ([place])   . Diabetes mellitus   . Elevated lipids   . Hypercholesterolemia   . Hypertension   . Iron deficiency anemia due to chronic blood loss 04/30/2018  . Noncompliance   . On home O2    2L N/C   . PSA elevation   . Pulmonary fibrosis ([place])   .  Vitamin D deficiency    Past Surgical History:  Procedure Laterality Date  . BIOPSY  04/23/2018   Procedure: BIOPSY;  Surgeon: Danie Binder, MD;  Location: [facility] END[facility] SUITE;  Service: Endoscopy;;  duodenum gastric  . CATARACT EXTRACTION W/PHACO  06/25/2012   Procedure: CATARACT EXTRACTION PHACO AND INTRAOCULAR LENS PLACEMENT (IOC);  Surgeon: Elta Guadeloupe T. Gershon Crane, MD;  Location: [facility] ORS;  Service: Ophthalmology;  Laterality: Left;  CDE=19.01  . CATARACT EXTRACTION W/PHACO  07/09/2012   Procedure: CATARACT EXTRACTION PHACO AND INTRAOCULAR LENS PLACEMENT (IOC);  Surgeon: Elta Guadeloupe T. Gershon Crane, MD;  Location: [facility] ORS;  Service: Ophthalmology;  Laterality: Right;  CDE: 20.09  . COLONOSCOPY WITH  PROPOFOL N/A 04/23/2018   Procedure: COLONOSCOPY WITH PROPOFOL;  Surgeon: Danie Binder, MD;  Location: [facility] END[facility];  Service: Endoscopy;  Laterality: N/A;  . ESOPHAGOGASTRODUODENOSCOPY (EGD) WITH PROPOFOL N/A 04/23/2018   Procedure: ESOPHAGOGASTRODUODENOSCOPY (EGD) WITH PROPOFOL;  Surgeon: Danie Binder, MD;  Location: [facility];  Service: Endoscopy;  Laterality: N/A;   Current Outpatient Medications on File Prior to Visit  Medication Sig Dispense Refill  . albuterol (PROVENTIL) (2.5 MG/3ML) 0.083% nebulizer solution INHALE 1 VIAL VIA NEBULIZER EVERY 6 HOURS AS NEEDED FOR WHEEZING OR SHORTNESS OF BREATH (Patient taking differently: Take 2.5 mg by nebulization every 6 (six) hours as needed for wheezing or shortness of breath. ) 360 mL 4  . apixaban (ELIQUIS) 2.5 MG TABS tablet Take 1 tablet (2.5 mg total) by mouth 2 (two) times daily. Restart on 7/24 120 tablet 0  . aspirin EC 81 MG tablet Take 1 tablet (81 mg total) by mouth daily. Resume on 7/24    . cloNIDine (CATAPRES) 0.1 MG tablet Take 1 tablet (0.1 mg total) by mouth 2 (two) times daily. 60 tablet 3  . diltiazem (CARDIZEM CD) 240 MG 24 hr capsule Take 1 capsule (240 mg total) by mouth daily. 30 capsule 2  . furosemide (LASIX) 40 MG tablet Take 1 tablet (40 mg total) by mouth every other day. Takes with Potassium 20MEQ. First dose of Lasix 11/6. (Patient taking differently: Take 40 mg by mouth daily. Takes with Potassium 20MEQ. First dose of Lasix 11/6.) 30 tablet   . Insulin Glargine (BASAGLAR KWIKPEN) 100 UNIT/ML SOPN Inject 20 Units into the skin 2 (two) times daily.    . metoprolol tartrate (LOPRESSOR) 25 MG tablet TAKE 1 TABLET BY MOUTH TWICE DAILY 60 tablet 3  . ONE TOUCH ULTRA TEST test strip     . ONETOUCH DELICA LANCETS 09W MISC USE TO CHECK BLOOD SUGAR TWICE DAILY AS DIRECTED 100 each 2  . OXYGEN Inhale 3 L into the lungs continuous.     . polyethylene glycol (MIRALAX / GLYCOLAX) packet Take 17 g by mouth daily as needed for  mild constipation or moderate constipation.    . potassium chloride SA (K-DUR,KLOR-CON) 20 MEQ tablet Take 1 tablet (20 mEq total) by mouth daily. When he takes lasix 30 tablet 3  . pravastatin (PRAVACHOL) 80 MG tablet TAKE 1 TABLET BY MOUTH AT BEDTIME 30 tablet 0  . predniSONE (DELTASONE) 20 MG tablet Take 2 tablets (40 mg total) by mouth daily with breakfast. 14 tablet 0  . Tiotropium Bromide-Olodaterol (STIOLTO RESPIMAT) 2.5-2.5 MCG/ACT AERS Inhale 2 Inhalers into the lungs daily. 1 Inhaler 11   No current facility-administered medications on file prior to visit.    Allergies  Allergen Reactions  . Ace Inhibitors Other (See Comments)    Hyperkalemia--07/23/2013:patient states not familiar with  the following allergy   Social History   Socioeconomic History  . Marital status: Married    Spouse name: Not on file  . Number of children: Not on file  . Years of education: Not on file  . Highest education level: Not on file  Occupational History  . 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 Financial resource strain: Not on file  . Food insecurity:    Worry: Not on file    Inability: Not on file  . Transportation needs:    Medical: Not on file    Non-medical: Not on file  Tobacco Use  . Smoking status: Former Smoker    Packs/day: 1.50    Years: 60.00    Pack years: 90.00    Types: Cigarettes    Last attempt to quit: 12/31/2012    Years since quitting: 5.8  . Smokeless tobacco: Never Used  Substance and Sexual Activity  . Alcohol use: No  . Drug use: No  . Sexual activity: Yes    Birth control/protection: None  Lifestyle  . Physical activity:    Days per week: Not on file    Minutes per session: Not on file  . Stress: Not on file  Relationships  . Social connections:    Talks on phone: Not on file    Gets together: Not on file    Attends religious service: Not on file    Active member of club or organization: Not on file    Attends meetings  of clubs or organizations: Not on file    Relationship status: Not on file  . Intimate partner violence:    Fear of current or ex partner: Not on file    Emotionally abused: Not on file    Physically abused: Not on file    Forced sexual activity: Not on file  Other Topics Concern  . Not on file  Social History Narrative  . Not on file     Review of Systems  All other systems reviewed and are negative.      Objective:   Physical Exam Vitals signs reviewed.  Constitutional:      General: He is not in acute distress.    Appearance: Normal appearance. He is obese. He is not ill-appearing, toxic-appearing or diaphoretic.  HENT:     Nose: Nose normal. No congestion or rhinorrhea.  Cardiovascular:     Rate and Rhythm: Normal rate and regular rhythm.     Heart sounds: Normal heart sounds. No murmur. No gallop.   Pulmonary:     Effort: Pulmonary effort is normal. No respiratory distress.     Breath sounds: No stridor. No wheezing, rhonchi or rales.  Musculoskeletal:     Right lower leg: No edema.     Left lower leg: No edema.  Neurological:     Mental Status: He is alert.           Assessment & Plan:  COPD exacerbation  Has improved dramatically on prednisone.  Complete prednisone.  Discontinue Stiolto and resume Trelegy 1 inhalation a day.  Explained to the patient repeatedly not to use both medications together but that he should stay on one or the other indefinitely.  He states that he will stay on Trelegy.  His blood sugars prior to starting prednisone average between 136 and 186 with the vast majority being between 136 and 164.  This is reasonable.  Since starting prednisone, his blood sugars have been as  high as 449 however he will be off of prednisone in 3 days and therefore I will not make any adjustments at this time.

## 2018-10-28 ENCOUNTER — Other Ambulatory Visit: Payer: Self-pay

## 2018-10-28 NOTE — Patient Outreach (Signed)
[facility]) Care Management  10/28/2018  Edward [name] [birth year] [id number]   Medication Adherence call to Mr. Edward [name] patient did not answer patient is due on Losartan 100 mg and Pravsattain 80 mg. Mr. Edward [name] is showing past due under [place].   [place] Management Direct Dial [phone]  Fax [phone] [name]

## 2018-10-29 ENCOUNTER — Other Ambulatory Visit: Payer: Self-pay | Admitting: Family Medicine

## 2018-10-29 DIAGNOSIS — R600 Localized edema: Secondary | ICD-10-CM

## 2018-10-29 DIAGNOSIS — Z09 Encounter for follow-up examination after completed treatment for conditions other than malignant neoplasm: Secondary | ICD-10-CM

## 2018-10-29 DIAGNOSIS — I5032 Chronic diastolic (congestive) heart failure: Secondary | ICD-10-CM

## 2018-10-30 ENCOUNTER — Observation Stay (HOSPITAL_COMMUNITY)
Admission: EM | Admit: 2018-10-30 | Discharge: 2018-11-01 | Disposition: A | Discharge status: Home / Self Care | Payer: Medicare Other | Attending: Family Medicine | Admitting: Family Medicine

## 2018-10-30 ENCOUNTER — Encounter (HOSPITAL_COMMUNITY): Payer: Self-pay | Admitting: Emergency Medicine

## 2018-10-30 ENCOUNTER — Emergency Department (HOSPITAL_COMMUNITY): Payer: Medicare Other

## 2018-10-30 ENCOUNTER — Other Ambulatory Visit: Payer: Self-pay

## 2018-10-30 DIAGNOSIS — L03211 Cellulitis of face: Secondary | ICD-10-CM | POA: Diagnosis not present

## 2018-10-30 DIAGNOSIS — I480 Paroxysmal atrial fibrillation: Secondary | ICD-10-CM

## 2018-10-30 DIAGNOSIS — R55 Syncope and collapse: Secondary | ICD-10-CM

## 2018-10-30 DIAGNOSIS — I5032 Chronic diastolic (congestive) heart failure: Secondary | ICD-10-CM | POA: Diagnosis not present

## 2018-10-30 DIAGNOSIS — K1120 Sialoadenitis, unspecified: Secondary | ICD-10-CM | POA: Diagnosis present

## 2018-10-30 DIAGNOSIS — R05 Cough: Secondary | ICD-10-CM | POA: Insufficient documentation

## 2018-10-30 DIAGNOSIS — J449 Chronic obstructive pulmonary disease, unspecified: Secondary | ICD-10-CM | POA: Diagnosis present

## 2018-10-30 DIAGNOSIS — IMO0002 Reserved for concepts with insufficient information to code with codable children: Secondary | ICD-10-CM | POA: Diagnosis present

## 2018-10-30 DIAGNOSIS — I130 Hypertensive heart and chronic kidney disease with heart failure and stage 1 through stage 4 chronic kidney disease, or unspecified chronic kidney disease: Secondary | ICD-10-CM | POA: Diagnosis not present

## 2018-10-30 DIAGNOSIS — Z79899 Other long term (current) drug therapy: Secondary | ICD-10-CM | POA: Diagnosis not present

## 2018-10-30 DIAGNOSIS — E1165 Type 2 diabetes mellitus with hyperglycemia: Secondary | ICD-10-CM

## 2018-10-30 DIAGNOSIS — K219 Gastro-esophageal reflux disease without esophagitis: Secondary | ICD-10-CM | POA: Diagnosis present

## 2018-10-30 DIAGNOSIS — N183 Chronic kidney disease, stage 3 (moderate): Secondary | ICD-10-CM | POA: Insufficient documentation

## 2018-10-30 DIAGNOSIS — T783XXA Angioneurotic edema, initial encounter: Secondary | ICD-10-CM | POA: Diagnosis not present

## 2018-10-30 DIAGNOSIS — Z87891 Personal history of nicotine dependence: Secondary | ICD-10-CM | POA: Diagnosis not present

## 2018-10-30 DIAGNOSIS — R600 Localized edema: Secondary | ICD-10-CM | POA: Diagnosis present

## 2018-10-30 DIAGNOSIS — E1122 Type 2 diabetes mellitus with diabetic chronic kidney disease: Secondary | ICD-10-CM | POA: Insufficient documentation

## 2018-10-30 DIAGNOSIS — J439 Emphysema, unspecified: Secondary | ICD-10-CM

## 2018-10-30 DIAGNOSIS — Z7901 Long term (current) use of anticoagulants: Secondary | ICD-10-CM | POA: Insufficient documentation

## 2018-10-30 DIAGNOSIS — J9611 Chronic respiratory failure with hypoxia: Secondary | ICD-10-CM

## 2018-10-30 DIAGNOSIS — I10 Essential (primary) hypertension: Secondary | ICD-10-CM

## 2018-10-30 DIAGNOSIS — I4891 Unspecified atrial fibrillation: Secondary | ICD-10-CM | POA: Diagnosis present

## 2018-10-30 DIAGNOSIS — Z7982 Long term (current) use of aspirin: Secondary | ICD-10-CM | POA: Diagnosis not present

## 2018-10-30 LAB — CBC
HCT: 36.5 % — ABNORMAL LOW (ref 39.0–52.0)
Hemoglobin: 11.6 g/dL — ABNORMAL LOW (ref 13.0–17.0)
MCH: 27.5 pg (ref 26.0–34.0)
MCHC: 31.8 g/dL (ref 30.0–36.0)
MCV: 86.5 fL (ref 80.0–100.0)
NRBC: 0 % (ref 0.0–0.2)
Platelets: 410 10*3/uL — ABNORMAL HIGH (ref 150–400)
RBC: 4.22 MIL/uL (ref 4.22–5.81)
RDW: 14.3 % (ref 11.5–15.5)
WBC: 20.4 10*3/uL — ABNORMAL HIGH (ref 4.0–10.5)

## 2018-10-30 LAB — URINALYSIS, ROUTINE W REFLEX MICROSCOPIC
Bilirubin Urine: NEGATIVE
Glucose, UA: 50 mg/dL — AB
Ketones, ur: NEGATIVE mg/dL
Leukocytes, UA: NEGATIVE
Nitrite: NEGATIVE
Protein, ur: >=300 mg/dL — AB
Specific Gravity, Urine: 1.011 (ref 1.005–1.030)
pH: 5 (ref 5.0–8.0)

## 2018-10-30 LAB — BASIC METABOLIC PANEL
Anion gap: 10 (ref 5–15)
BUN: 42 mg/dL — ABNORMAL HIGH (ref 8–23)
CO2: 30 mmol/L (ref 22–32)
Calcium: 8.7 mg/dL — ABNORMAL LOW (ref 8.9–10.3)
Chloride: 91 mmol/L — ABNORMAL LOW (ref 98–111)
Creatinine, Ser: 1.1 mg/dL (ref 0.61–1.24)
GFR calc non Af Amer: >60 mL/min (ref >60)
Glucose, Bld: 201 mg/dL — ABNORMAL HIGH (ref 70–99)
Potassium: 3.5 mmol/L (ref 3.5–5.1)
Sodium: 131 mmol/L — ABNORMAL LOW (ref 135–145)

## 2018-10-30 LAB — GLUCOSE, CAPILLARY
Glucose-Capillary: 73 mg/dL (ref 70–99)
Glucose-Capillary: 92 mg/dL (ref 70–99)

## 2018-10-30 LAB — LACTIC ACID, PLASMA
Lactic Acid, Venous: 1.7 mmol/L (ref 0.5–1.9)
Lactic Acid, Venous: 2 mmol/L (ref 0.5–1.9)

## 2018-10-30 LAB — PROTIME-INR
INR: 1.01
Prothrombin Time: 13.2 seconds (ref 11.4–15.2)

## 2018-10-30 MED ORDER — IPRATROPIUM-ALBUTEROL 0.5-2.5 (3) MG/3ML IN SOLN
3.0000 mL | Freq: Four times a day (QID) | RESPIRATORY_TRACT | Status: DC
  Administered 2018-10-30 – 2018-11-01 (×7): 3 mL via RESPIRATORY_TRACT
  Filled 2018-10-30 (×7): qty 3

## 2018-10-30 MED ORDER — ACETAMINOPHEN 325 MG PO TABS
650.0000 mg | ORAL_TABLET | Freq: Four times a day (QID) | ORAL | Status: DC | PRN
  Administered 2018-10-30: 650 mg via ORAL
  Filled 2018-10-30: qty 2

## 2018-10-30 MED ORDER — APIXABAN 2.5 MG PO TABS
2.5000 mg | ORAL_TABLET | Freq: Two times a day (BID) | ORAL | Status: DC
  Administered 2018-10-30 – 2018-11-01 (×4): 2.5 mg via ORAL
  Filled 2018-10-30 (×4): qty 1

## 2018-10-30 MED ORDER — INSULIN ASPART 100 UNIT/ML ~~LOC~~ SOLN: 0.0000 [IU] | Freq: Every day | SUBCUTANEOUS | Status: DC

## 2018-10-30 MED ORDER — PRAVASTATIN SODIUM 40 MG PO TABS
80.0000 mg | ORAL_TABLET | Freq: Every day | ORAL | Status: DC
  Administered 2018-10-30 – 2018-10-31 (×2): 80 mg via ORAL
  Filled 2018-10-30 (×2): qty 2

## 2018-10-30 MED ORDER — SODIUM CHLORIDE 0.9 % IV SOLN
INTRAVENOUS | Status: DC
  Administered 2018-10-30 – 2018-10-31 (×2): via INTRAVENOUS

## 2018-10-30 MED ORDER — SODIUM CHLORIDE 0.9 % IV BOLUS (SEPSIS)
500.0000 mL | Freq: Once | INTRAVENOUS | Status: AC
  Administered 2018-10-30: 500 mL via INTRAVENOUS

## 2018-10-30 MED ORDER — ONDANSETRON HCL 4 MG PO TABS: 4.0000 mg | ORAL_TABLET | Freq: Four times a day (QID) | ORAL | Status: DC | PRN

## 2018-10-30 MED ORDER — ACETAMINOPHEN 325 MG PO TABS
650.0000 mg | ORAL_TABLET | Freq: Four times a day (QID) | ORAL | Status: DC | PRN
  Administered 2018-10-31 – 2018-11-01 (×3): 650 mg via ORAL
  Filled 2018-10-30 (×4): qty 2

## 2018-10-30 MED ORDER — METOPROLOL TARTRATE 25 MG PO TABS
25.0000 mg | ORAL_TABLET | Freq: Two times a day (BID) | ORAL | Status: DC
  Administered 2018-10-30 – 2018-11-01 (×4): 25 mg via ORAL
  Filled 2018-10-30 (×4): qty 1

## 2018-10-30 MED ORDER — BUDESONIDE 0.25 MG/2ML IN SUSP
0.2500 mg | Freq: Two times a day (BID) | RESPIRATORY_TRACT | Status: DC
  Administered 2018-10-30 – 2018-11-01 (×4): 0.25 mg via RESPIRATORY_TRACT
  Filled 2018-10-30 (×4): qty 2

## 2018-10-30 MED ORDER — INSULIN GLARGINE 100 UNIT/ML ~~LOC~~ SOLN
20.0000 [IU] | Freq: Two times a day (BID) | SUBCUTANEOUS | Status: DC
  Administered 2018-10-30: 20 [IU] via SUBCUTANEOUS
  Filled 2018-10-30 (×4): qty 0.2

## 2018-10-30 MED ORDER — ACETAMINOPHEN 650 MG RE SUPP: 650.0000 mg | Freq: Four times a day (QID) | RECTAL | Status: DC | PRN

## 2018-10-30 MED ORDER — INSULIN ASPART 100 UNIT/ML ~~LOC~~ SOLN: 0.0000 [IU] | Freq: Three times a day (TID) | SUBCUTANEOUS | Status: DC

## 2018-10-30 MED ORDER — SODIUM CHLORIDE 0.9 % IV SOLN
1000.0000 mL | INTRAVENOUS | Status: DC
  Administered 2018-10-30 (×3): 1000 mL via INTRAVENOUS

## 2018-10-30 MED ORDER — ASPIRIN EC 81 MG PO TBEC
81.0000 mg | DELAYED_RELEASE_TABLET | Freq: Every day | ORAL | Status: DC
  Administered 2018-10-31 – 2018-11-01 (×2): 81 mg via ORAL
  Filled 2018-10-30 (×2): qty 1

## 2018-10-30 MED ORDER — POLYETHYLENE GLYCOL 3350 17 G PO PACK: 17.0000 g | PACK | Freq: Every day | ORAL | Status: DC | PRN

## 2018-10-30 MED ORDER — CLONIDINE HCL 0.1 MG PO TABS
0.1000 mg | ORAL_TABLET | Freq: Two times a day (BID) | ORAL | Status: DC
  Administered 2018-10-30 – 2018-11-01 (×4): 0.1 mg via ORAL
  Filled 2018-10-30 (×4): qty 1

## 2018-10-30 MED ORDER — PIPERACILLIN-TAZOBACTAM 3.375 G IVPB
3.3750 g | Freq: Three times a day (TID) | INTRAVENOUS | Status: DC
  Administered 2018-10-31 – 2018-11-01 (×4): 3.375 g via INTRAVENOUS
  Filled 2018-10-30 (×4): qty 50

## 2018-10-30 MED ORDER — ONDANSETRON HCL 4 MG/2ML IJ SOLN: 4.0000 mg | Freq: Four times a day (QID) | INTRAMUSCULAR | Status: DC | PRN

## 2018-10-30 MED ORDER — VANCOMYCIN HCL 10 G IV SOLR
1500.0000 mg | INTRAVENOUS | Status: DC
  Administered 2018-10-31: 1500 mg via INTRAVENOUS
  Filled 2018-10-30 (×4): qty 1500

## 2018-10-30 MED ORDER — PIPERACILLIN-TAZOBACTAM 3.375 G IVPB 30 MIN
3.3750 g | Freq: Once | INTRAVENOUS | Status: AC
  Administered 2018-10-30: 3.375 g via INTRAVENOUS
  Filled 2018-10-30: qty 50

## 2018-10-30 MED ORDER — VANCOMYCIN HCL 10 G IV SOLR
1500.0000 mg | Freq: Once | INTRAVENOUS | Status: AC
  Administered 2018-10-30: 1500 mg via INTRAVENOUS
  Filled 2018-10-30 (×2): qty 1500

## 2018-10-30 MED ORDER — IPRATROPIUM-ALBUTEROL 0.5-2.5 (3) MG/3ML IN SOLN
3.0000 mL | Freq: Once | RESPIRATORY_TRACT | Status: AC
  Administered 2018-10-30: 3 mL via RESPIRATORY_TRACT
  Filled 2018-10-30: qty 3

## 2018-10-30 MED ORDER — DILTIAZEM HCL ER COATED BEADS 240 MG PO CP24
240.0000 mg | ORAL_CAPSULE | Freq: Every day | ORAL | Status: DC
  Administered 2018-10-31 – 2018-11-01 (×2): 240 mg via ORAL
  Filled 2018-10-30 (×2): qty 1

## 2018-10-30 NOTE — ED Notes (Addendum)
Per family, patient had seizure like activity in waiting room. Patient eyes open but would not respond to this nurse in waiting room. Brought patient back to room, patient was awake and responding at that time and able to stand and pivot to bed with assistance of nurses x 2. Patient's upper lip appears to be more swollen at this time and patient states he is short of breath. O2 saturation 92% on room air. Patient also urinated on himself prior to being brought back to room. Advised Dr Tomi Bamberger.

## 2018-10-30 NOTE — ED Notes (Signed)
Family members that were with the patient went home for lunch.  Contact them if they are needed, otherwise they will return around 1-1:30 pm.

## 2018-10-30 NOTE — H&P (Signed)
History and Physical    Edward [name] IRW:[id number] DOB: [birth year] DOA: 10/30/2018  PCP: Susy Frizzle, MD  Patient coming from: Home  I have personally briefly reviewed patient's old medical records in [place]  Chief Complaint: Upper lip swelling  HPI: Edward [name] is a 84 y.o. male with medical history significant of COPD, diastolic heart failure, atrial fibrillation on anticoagulation, chronic respiratory failure on home oxygen 3 L, presents to the emergency room with complaints of upper lip swelling.  Reports onset of symptoms occurring 2 to 3 days ago.  Denies any specific trauma to this area.  He has not noticed any insect bites.  He did not fall recently.  He says is been several days since he shaved.  Has not noticed any drainage from any lesions in his mouth or on his leg.  Today, he developed tenderness to touch on his upper lip which is why he came to the emergency room.  While in the waiting room, patient briefly became less responsive.  There was concern that he may have syncopized versus seizure.  Patient was brought back into examination room where he was back to his normal self and did not appear to have any postictal phase.  Per report from family members, he apparently tilted his head back and there was some shaking.  Patient quickly regained consciousness and was back to his baseline.  Since arrival to the hospital, the patient has had a fever of 102.9.  Blood pressures have been stable in the 150s range.  He was noted to have a significant leukocytosis of 20,000, but review of records indicate that he recently been taking steroids for COPD exacerbation.  Other labs indicate an elevated BUN of 42.  Creatinine is normal.  Lactic acid mildly elevated at 2.0.  Urinalysis does not show any signs of infection.  CT head was unrevealing.  Chest x-ray did not show any evidence of pneumonia.  Patient was started on intravenous antibiotics due to concerns for developing  pneumonia.  He is been referred for admission.   Review of Systems: As per HPI otherwise 10 point review of systems negative.    Past Medical History:  Diagnosis Date  . Allergy    Rhinitis  . Atrial fibrillation ([place])   . Bronchitis   . Chronic respiratory failure ([place])   . Colon polyps   . COPD (chronic obstructive pulmonary disease) ([place])   . Diabetes mellitus   . Elevated lipids   . Hypercholesterolemia   . Hypertension   . Iron deficiency anemia due to chronic blood loss 04/30/2018  . Noncompliance   . On home O2    2L N/C   . PSA elevation   . Pulmonary fibrosis ([place])   . Vitamin D deficiency     Past Surgical History:  Procedure Laterality Date  . BIOPSY  04/23/2018   Procedure: BIOPSY;  Surgeon: Danie Binder, MD;  Location: [facility];  Service: Endoscopy;;  duodenum gastric  . CATARACT EXTRACTION W/PHACO  06/25/2012   Procedure: CATARACT EXTRACTION PHACO AND INTRAOCULAR LENS PLACEMENT (IOC);  Surgeon: Elta Guadeloupe T. Gershon Crane, MD;  Location: [facility];  Service: Ophthalmology;  Laterality: Left;  CDE=19.01  . CATARACT EXTRACTION W/PHACO  07/09/2012   Procedure: CATARACT EXTRACTION PHACO AND INTRAOCULAR LENS PLACEMENT (IOC);  Surgeon: Elta Guadeloupe T. Gershon Crane, MD;  Location: [facility];  Service: Ophthalmology;  Laterality: Right;  CDE: 20.09  . COLONOSCOPY WITH PROPOFOL N/A 04/23/2018   Procedure: COLONOSCOPY WITH PROPOFOL;  Surgeon: Danie Binder, MD;  Location: [facility];  Service: Endoscopy;  Laterality: N/A;  . ESOPHAGOGASTRODUODENOSCOPY (EGD) WITH PROPOFOL N/A 04/23/2018   Procedure: ESOPHAGOGASTRODUODENOSCOPY (EGD) WITH PROPOFOL;  Surgeon: Danie Binder, MD;  Location: [facility];  Service: Endoscopy;  Laterality: N/A;    Social History:  reports that he quit smoking about 5 years ago. His smoking use included cigarettes. He has a 90.00 pack-year smoking history. He has never used smokeless tobacco. He reports that he does not drink alcohol or use drugs.  Allergies   Allergen Reactions  . Ace Inhibitors Other (See Comments)    Hyperkalemia--07/23/2013:patient states not familiar with the following allergy    Family History  Problem Relation Age of Onset  . Heart disease Mother   . CAD Other   . Diabetes Other     Prior to Admission medications   Medication Sig Start Date End Date Taking? Authorizing Provider  apixaban (ELIQUIS) 2.5 MG TABS tablet Take 1 tablet (2.5 mg total) by mouth 2 (two) times daily. Restart on 7/24 04/23/18  Yes Kathie Dike, MD  aspirin EC 81 MG tablet Take 1 tablet (81 mg total) by mouth daily. Resume on 7/24 04/23/18  Yes Kathie Dike, MD  cloNIDine (CATAPRES) 0.1 MG tablet Take 1 tablet (0.1 mg total) by mouth 2 (two) times daily. 10/03/18  Yes Susy Frizzle, MD  diltiazem (CARDIZEM CD) 240 MG 24 hr capsule TAKE 1 CAPSULE BY MOUTH DAILY 10/30/18  Yes Susy Frizzle, MD  furosemide (LASIX) 40 MG tablet Take 1 tablet (40 mg total) by mouth daily. 10/30/18  Yes Susy Frizzle, MD  glucose blood (ONE TOUCH ULTRA TEST) test strip CHECK FASTING BLOOD SUGAR TWICE DAILY 10/30/18  Yes Susy Frizzle, MD  Insulin Glargine (BASAGLAR KWIKPEN) 100 UNIT/ML SOPN Inject 20 Units into the skin 2 (two) times daily.   Yes [provider]  ipratropium (ATROVENT) 0.02 % nebulizer solution Inhale 3 mLs into the lungs every 6 (six) hours as needed. 10/23/18  Yes [provider]  metoprolol tartrate (LOPRESSOR) 25 MG tablet TAKE 1 TABLET BY MOUTH TWICE DAILY 10/01/18  Yes [name], [name], MD  [facility] DELICA LANCETS 06T MISC USE TO CHECK BLOOD SUGAR TWICE DAILY AS DIRECTED 02/22/18  Yes Dena Billet B, PA-C  OXYGEN Inhale 3 L into the lungs continuous.    Yes [provider]  polyethylene glycol (MIRALAX / GLYCOLAX) packet Take 17 g by mouth daily as needed for mild constipation or moderate constipation. 10/06/18  Yes Johnson, Clanford L, MD  potassium chloride SA (K-DUR,KLOR-CON) 20 MEQ tablet Take 1 tablet (20  mEq total) by mouth daily. When he takes lasix 10/09/18  Yes Johnson, Clanford L, MD  pravastatin (PRAVACHOL) 80 MG tablet TAKE 1 TABLET BY MOUTH AT BEDTIME 10/30/18  Yes Susy Frizzle, MD  predniSONE (DELTASONE) 20 MG tablet Take 2 tablets (40 mg total) by mouth daily with breakfast. 10/22/18  Yes Susy Frizzle, MD  Tiotropium Bromide-Olodaterol (STIOLTO RESPIMAT) 2.5-2.5 MCG/ACT AERS Inhale 2 Inhalers into the lungs daily. 10/22/18  Yes Susy Frizzle, MD  TRELEGY ELLIPTA 100-62.5-25 MCG/INH AEPB Inhale 1 puff into the lungs daily. 10/22/18  Yes [provider]  albuterol (PROVENTIL) (2.5 MG/3ML) 0.083% nebulizer solution INHALE 1 VIAL VIA NEBULIZER EVERY 6 HOURS AS NEEDED FOR WHEEZING OR SHORTNESS OF BREATH Patient not taking: No sig reported 07/03/18   Orlena Sheldon, PA-C    Physical Exam: Vitals:   10/30/18  1200 10/30/18 1400 10/30/18 1507 10/30/18 1537  BP: (!) 164/80  (!) 157/83 (!) 156/85  Pulse: 98 (!) 102 (!) 105 (!) 102  Resp:  (!) 23  20  Temp:    (!) 102.9 F (39.4 C)  TempSrc:    Oral  SpO2: 98% 100% 99% 98%  Weight:      Height:        Constitutional: NAD, calm, comfortable Eyes: PERRL, lids and conjunctivae normal ENMT: Mucous membranes are moist. Posterior pharynx clear of any exudate or lesions.Normal dentition.  Upper lip is edematous, warm and tender to touch.  No areas of fluctuation appreciated Neck: normal, supple, no masses, no thyromegaly Respiratory: Occasional rhonchi with occasional wheeze bilaterally. Normal respiratory effort. No accessory muscle use.  Cardiovascular: Regular rate and rhythm, no murmurs / rubs / gallops. No extremity edema. 2+ pedal pulses. No carotid bruits.  Abdomen: no tenderness, no masses palpated. No hepatosplenomegaly. Bowel sounds positive.  Musculoskeletal: no clubbing / cyanosis. No joint deformity upper and lower extremities. Good ROM, no contractures. Normal muscle tone.  Skin: no rashes, lesions, ulcers. No  induration Neurologic: CN 2-12 grossly intact. Sensation intact, DTR normal. Strength 5/5 in all 4.  Psychiatric: Normal judgment and insight. Alert and oriented x 3. Normal mood.    Labs on Admission: I have personally reviewed following labs and imaging studies  CBC: Recent Labs  Lab 10/30/18 1032  WBC 20.4*  HGB 11.6*  HCT 36.5*  MCV 86.5  PLT 381*   Basic Metabolic Panel: Recent Labs  Lab 10/30/18 1032  NA 131*  K 3.5  CL 91*  CO2 30  GLUCOSE 201*  BUN 42*  CREATININE 1.10  CALCIUM 8.7*   GFR: Estimated Creatinine Clearance: 52.5 mL/min (by C-G formula based on SCr of 1.1 mg/dL). Liver Function Tests: No results for input(s): AST, ALT, ALKPHOS, BILITOT, PROT, ALBUMIN in the last 168 hours. No results for input(s): LIPASE, AMYLASE in the last 168 hours. No results for input(s): AMMONIA in the last 168 hours. Coagulation Profile: Recent Labs  Lab 10/30/18 1217  INR 1.01   Cardiac Enzymes: No results for input(s): CKTOTAL, CKMB, CKMBINDEX, TROPONINI in the last 168 hours. BNP (last 3 results) No results for input(s): PROBNP in the last 8760 hours. HbA1C: No results for input(s): HGBA1C in the last 72 hours. CBG: Recent Labs  Lab 10/30/18 1632  GLUCAP 73   Lipid Profile: No results for input(s): CHOL, HDL, LDLCALC, TRIG, CHOLHDL, LDLDIRECT in the last 72 hours. Thyroid Function Tests: No results for input(s): TSH, T4TOTAL, FREET4, T3FREE, THYROIDAB in the last 72 hours. Anemia Panel: No results for input(s): VITAMINB12, FOLATE, FERRITIN, TIBC, IRON, RETICCTPCT in the last 72 hours. Urine analysis:    Component Value Date/Time   COLORURINE YELLOW 10/30/2018 1359   APPEARANCEUR CLEAR 10/30/2018 1359   LABSPEC 1.011 10/30/2018 1359   PHURINE 5.0 10/30/2018 1359   GLUCOSEU 50 (A) 10/30/2018 1359   HGBUR SMALL (A) 10/30/2018 1359   BILIRUBINUR NEGATIVE 10/30/2018 1359   KETONESUR NEGATIVE 10/30/2018 1359   PROTEINUR >=300 (A) 10/30/2018 1359    NITRITE NEGATIVE 10/30/2018 1359   LEUKOCYTESUR NEGATIVE 10/30/2018 1359    Radiological Exams on Admission: Dg Chest 2 View  Result Date: 10/30/2018 CLINICAL DATA:  Cough.  Seizure. EXAM: CHEST - 2 VIEW COMPARISON:  10/04/2018 and 07/31/2018 FINDINGS: Heart size and pulmonary vascularity are normal. There is slight chronic accentuation of the interstitial markings at the left lung base, stable. Lungs are otherwise clear  except for peribronchial thickening on the lateral view. No effusions. No acute bone abnormality. IMPRESSION: Slight bronchitic changes. Electronically Signed   By: Lorriane Shire M.D.   On: 10/30/2018 11:19   Ct Head Wo Contrast  Result Date: 10/30/2018 CLINICAL DATA:  Altered mental status. EXAM: CT HEAD WITHOUT CONTRAST TECHNIQUE: Contiguous axial images were obtained from the base of the skull through the vertex without intravenous contrast. COMPARISON:  CT head dated Feb 17, 2018. FINDINGS: Brain: No evidence of acute infarction, hemorrhage, hydrocephalus, extra-axial collection or mass lesion/mass effect. Stable mild to moderate atrophy and mild chronic microvascular ischemic changes. Vascular: Atherosclerotic vascular calcification of the carotid siphons. No hyperdense vessel. Skull: Normal. Negative for fracture or focal lesion. Sinuses/Orbits: No acute finding. Other: None. IMPRESSION: 1.  No acute intracranial abnormality. 2. Stable atrophy and chronic microvascular ischemic changes. Electronically Signed   By: Titus Dubin M.D.   On: 10/30/2018 11:36    EKG: Independently reviewed.  Sinus rhythm without acute changes  Assessment/Plan Principal Problem:   Facial cellulitis Active Problems:   Diabetes mellitus type 2, uncontrolled ([name])   COPD (chronic obstructive pulmonary disease) ([name])   Hypertension   Gastroesophageal reflux disease   Atrial fibrillation ([name])   Chronic diastolic heart failure ([name])   Syncope   Chronic respiratory failure with hypoxia  ([place])     1. Facial cellulitis.  Swelling of upper lip favors more cellulitis than an angioedema picture.  He is not having trouble breathing or swallowing.  He is tender to touch on upper lip and febrile.  He has erythema in this area as well.  We will continue on intravenous antibiotics for now.  Check blood cultures. 2. Syncope.  Patient had a syncopal event in the emergency room waiting area.  This was likely related to infection/volume depletion.  We will continue to monitor on telemetry.  Cycle cardiac markers.  He does not have any neurologic deficits at this time.  Continue observation. 3. Diabetes.  Continue on sliding scale insulin.  Continue on basal insulin at 20 units twice daily.  Follow blood sugars. 4. Atrial fibrillation, paroxysmal.  Currently in sinus rhythm.  Continue diltiazem and metoprolol for rate control.  He is anticoagulated with apixaban. 5. Hypertension.  On several antihypertensive agents.  He is on clonidine, diltiazem, metoprolol.  We will continue current treatments 6. COPD.  Recently had an exacerbation and is finishing up a prednisone taper.  No wheezing at this time.  Continue on bronchodilators. 7. Chronic diastolic congestive heart failure.  Appears compensated at this time.  Hold Lasix since he is mildly dehydrated.  DVT prophylaxis: apixiban Code Status: full code  Family Communication: no family present  Disposition Plan: discharge home once cellulitis is better  Consults called:   Admission status: observation, telemetry   Kathie Dike MD [facility]   If 7PM-7AM, please contact night-coverage [email or web address]   10/30/2018, 5:45 PM

## 2018-10-30 NOTE — ED Notes (Signed)
Date and time results received: 10/30/18 2:30 PM  (use smartphrase ".now" to insert current time)  Test: Lactic Acid Critical Value: 2.0  Name of Provider Notified: Memon  Orders Received? Or Actions Taken?: Orders Received - See Orders for details

## 2018-10-30 NOTE — ED Provider Notes (Signed)
[name] EMERGENCY DEPARTMENT Provider Note   CSN: [id number] Arrival date & time: 10/30/18  6387     History   Chief Complaint Chief Complaint  Patient presents with  . Facial Swelling    HPI Edward [name] is a 84 y.o. male.  HPI Patient presented to the emergency room for evaluation of lip swelling.  Patient states he is noticed some swelling in his upper left over the last few days.  Today however the swelling has become painful.  The area is sore to the touch.  He denies any history of fevers or chills.  Patient denies chest pain or shortness of breath.  Not having any difficulty swallowing or breathing.  While the patient was in the waiting room he apparently had an episode of unresponsiveness.  Patient denies having this however family members state they saw him tilt his head back and he was shaking.  He did not answer them.  Episode lasted for a minute or 2.  Patient quickly regained consciousness and is now completely back to his baseline. Past Medical History:  Diagnosis Date  . Allergy    Rhinitis  . Atrial fibrillation ([place])   . Bronchitis   . Chronic respiratory failure ([place])   . Colon polyps   . COPD (chronic obstructive pulmonary disease) ([place])   . Diabetes mellitus   . Elevated lipids   . Hypercholesterolemia   . Hypertension   . Iron deficiency anemia due to chronic blood loss 04/30/2018  . Noncompliance   . On home O2    2L N/C   . PSA elevation   . Pulmonary fibrosis ([place])   . Vitamin D deficiency     Patient Active Problem List   Diagnosis Date Noted  . Chronic diastolic heart failure ([place])   . Hypoxia   . Atrial fibrillation ([place])   . COPD with acute exacerbation ([place]) 07/31/2018  . Hyponatremia 07/31/2018  . Iron deficiency anemia due to chronic blood loss 04/30/2018  . Respiratory distress   . Hematochezia   . Goals of care, counseling/discussion   . Palliative care by specialist   . DNR (do not resuscitate) discussion   . Acute renal  failure with acute tubular necrosis superimposed on stage 3 chronic kidney disease ([place])   . Palliative care encounter   . COPD exacerbation ([place]) 04/17/2018  . Acute on chronic diastolic CHF (congestive heart failure) ([place])   . Severe anemia 02/17/2018  . Bradycardia 02/17/2018  . Hypothermia   . Gastroesophageal reflux disease   . Acute encephalopathy 09/25/2017  . On home O2 09/25/2017  . Bilateral lower extremity edema 06/27/2017  . Insomnia 03/28/2017  . CKD (chronic kidney disease), stage III ([place]) 03/07/2017  . AF (paroxysmal atrial fibrillation) ([place]) 03/05/2017  . Chronic diastolic CHF (congestive heart failure) ([place]) 03/04/2017  . HCAP (healthcare-associated pneumonia) 03/04/2017  . Sepsis due to pneumonia ([place]) 02/25/2017  . Constipation 02/25/2017  . Overflow diarrhea/Constipation 02/25/2017  . Non compliance w medication regimen 12/02/2015  . Hypercholesterolemia 05/12/2014  . Acute on chronic respiratory failure with hypoxemia ([place]) 11/17/2013  . Elevated PSA 01/20/2013  . Diabetes mellitus type 2, uncontrolled ([place])   . COPD (chronic obstructive pulmonary disease) ([place])   . Hypertension   . Elevated lipids   . Pulmonary fibrosis ([place])   . Colon polyps   . Colon polyps     Past Surgical History:  Procedure Laterality Date  . BIOPSY  04/23/2018   Procedure: BIOPSY;  Surgeon: Oneida Alar,  Marga Melnick, MD;  Location: [facility] ENDO SUITE;  Service: Endoscopy;;  duodenum gastric  . CATARACT EXTRACTION W/PHACO  06/25/2012   Procedure: CATARACT EXTRACTION PHACO AND INTRAOCULAR LENS PLACEMENT (IOC);  Surgeon: Elta Guadeloupe T. Gershon Crane, MD;  Location: [facility] ORS;  Service: Ophthalmology;  Laterality: Left;  CDE=19.01  . CATARACT EXTRACTION W/PHACO  07/09/2012   Procedure: CATARACT EXTRACTION PHACO AND INTRAOCULAR LENS PLACEMENT (IOC);  Surgeon: Elta Guadeloupe T. Gershon Crane, MD;  Location: [facility] ORS;  Service: Ophthalmology;  Laterality: Right;  CDE: 20.09  . COLONOSCOPY WITH PROPOFOL N/A 04/23/2018   Procedure:  COLONOSCOPY WITH PROPOFOL;  Surgeon: Danie Binder, MD;  Location: [facility] ENDO SU[facility];  Service: Endoscopy;  Laterality: N/A;  . ESOPHAGOGASTRODUODENOSCOPY (EGD) WITH PROPOFOL N/A 04/23/2018   Procedure: ESOPHAGOGASTRODUODENOSCOPY (EGD) WITH PROPOFOL;  Surgeon: Danie Binder, MD;  Location: [facility];  Service: Endoscopy;  Laterality: N/A;        Home Medications    Prior to Admission medications   Medication Sig Start Date End Date Taking? Authorizing Provider  apixaban (ELIQUIS) 2.5 MG TABS tablet Take 1 tablet (2.5 mg total) by mouth 2 (two) times daily. Restart on 7/24 04/23/18  Yes Kathie Dike, MD  aspirin EC 81 MG tablet Take 1 tablet (81 mg total) by mouth daily. Resume on 7/24 04/23/18  Yes Kathie Dike, MD  cloNIDine (CATAPRES) 0.1 MG tablet Take 1 tablet (0.1 mg total) by mouth 2 (two) times daily. 10/03/18  Yes Susy Frizzle, MD  diltiazem (CARDIZEM CD) 240 MG 24 hr capsule TAKE 1 CAPSULE BY MOUTH DAILY 10/30/18  Yes Susy Frizzle, MD  furosemide (LASIX) 40 MG tablet Take 1 tablet (40 mg total) by mouth daily. 10/30/18  Yes Susy Frizzle, MD  glucose blood (ONE TOUCH ULTRA TEST) test strip CHECK FASTING BLOOD SUGAR TWICE DAILY 10/30/18  Yes Susy Frizzle, MD  Insulin Glargine (BASAGLAR KWIKPEN) 100 UNIT/ML SOPN Inject 20 Units into the skin 2 (two) times daily.   Yes [provider]  ipratropium (ATROVENT) 0.02 % nebulizer solution Inhale 3 mLs into the lungs every 6 (six) hours as needed. 10/23/18  Yes [provider]  metoprolol tartrate (LOPRESSOR) 25 MG tablet TAKE 1 TABLET BY MOUTH TWICE DAILY 10/01/18  Yes [name], [name], MD  [facility] DELICA LANCETS 31S MISC USE TO CHECK BLOOD SUGAR TWICE DAILY AS DIRECTED 02/22/18  Yes Dena Billet B, PA-C  OXYGEN Inhale 3 L into the lungs continuous.    Yes [provider]  polyethylene glycol (MIRALAX / GLYCOLAX) packet Take 17 g by mouth daily as needed for mild constipation or moderate  constipation. 10/06/18  Yes Johnson, Clanford L, MD  potassium chloride SA (K-DUR,KLOR-CON) 20 MEQ tablet Take 1 tablet (20 mEq total) by mouth daily. When he takes lasix 10/09/18  Yes Johnson, Clanford L, MD  pravastatin (PRAVACHOL) 80 MG tablet TAKE 1 TABLET BY MOUTH AT BEDTIME 10/30/18  Yes Susy Frizzle, MD  predniSONE (DELTASONE) 20 MG tablet Take 2 tablets (40 mg total) by mouth daily with breakfast. 10/22/18  Yes Susy Frizzle, MD  Tiotropium Bromide-Olodaterol (STIOLTO RESPIMAT) 2.5-2.5 MCG/ACT AERS Inhale 2 Inhalers into the lungs daily. 10/22/18  Yes Susy Frizzle, MD  TRELEGY ELLIPTA 100-62.5-25 MCG/INH AEPB Inhale 1 puff into the lungs daily. 10/22/18  Yes [provider]  albuterol (PROVENTIL) (2.5 MG/3ML) 0.083% nebulizer solution INHALE 1 VIAL VIA NEBULIZER EVERY 6 HOURS AS NEEDED FOR WHEEZING OR SHORTNESS OF BREATH Patient not taking: No sig reported  07/03/18   Orlena Sheldon, PA-C    Family History Family History  Problem Relation Age of Onset  . Heart disease Mother   . CAD Other   . Diabetes Other     Social History Social History   Tobacco Use  . Smoking status: Former Smoker    Packs/day: 1.50    Years: 60.00    Pack years: 90.00    Types: Cigarettes    Last attempt to quit: 12/31/2012    Years since quitting: 5.8  . Smokeless tobacco: Never Used  Substance Use Topics  . Alcohol use: No  . Drug use: No     Allergies   Ace inhibitors   Review of Systems Review of Systems  All other systems reviewed and are negative.    Physical Exam Updated Vital Signs BP (!) 164/80   Pulse 98   Temp 98.2 F (36.8 C) (Oral)   Resp (!) 23   Ht 1.651 m (5\' 5" )   Wt 83.9 kg   SpO2 98%   BMI 30.79 kg/m   Physical Exam Vitals signs and nursing note reviewed.  Constitutional:      General: He is not in acute distress.    Appearance: He is well-developed. He is ill-appearing.     Comments: Elderly, frail  HENT:     Head: Normocephalic and  atraumatic.     Comments: Edema of the upper lip, mild surrounding erythema, mucosal ulceration in the soft palate on the left side    Right Ear: External ear normal.     Left Ear: External ear normal.  Eyes:     General: No scleral icterus.       Right eye: No discharge.        Left eye: No discharge.     Conjunctiva/sclera: Conjunctivae normal.  Neck:     Musculoskeletal: Neck supple.     Trachea: No tracheal deviation.  Cardiovascular:     Rate and Rhythm: Normal rate and regular rhythm.  Pulmonary:     Effort: Pulmonary effort is normal. No respiratory distress.     Breath sounds: No stridor. Wheezing present. No rales.  Abdominal:     General: Bowel sounds are normal. There is no distension.     Palpations: Abdomen is soft.     Tenderness: There is no abdominal tenderness. There is no guarding or rebound.  Musculoskeletal:        General: No tenderness.  Skin:    General: Skin is warm and dry.     Findings: No rash.  Neurological:     Mental Status: He is alert.     Cranial Nerves: No cranial nerve deficit (no facial droop, extraocular movements intact, no slurred speech).     Sensory: No sensory deficit.     Motor: Tremor present. No abnormal muscle tone or seizure activity.     Coordination: Coordination normal.      ED Treatments / Results  Labs (all labs ordered are listed, but only abnormal results are displayed) Labs Reviewed  BASIC METABOLIC PANEL - Abnormal; Notable for the following components:      Result Value   Sodium 131 (*)    Chloride 91 (*)    Glucose, Bld 201 (*)    BUN 42 (*)    Calcium 8.7 (*)    All other components within normal limits  CBC - Abnormal; Notable for the following components:   WBC 20.4 (*)    Hemoglobin 11.6 (*)  HCT 36.5 (*)    Platelets 410 (*)    All other components within normal limits  LACTIC ACID, PLASMA  PROTIME-INR  LACTIC ACID, PLASMA  URINALYSIS, ROUTINE W REFLEX MICROSCOPIC    EKG EKG  Interpretation  Date/Time:  Wednesday October 30 2018 10:50:56 EST Ventricular Rate:  87 PR Interval:    QRS Duration: 94 QT Interval:  348 QTC Calculation: 419 R Axis:   65 Text Interpretation:  Poor data quality Normal sinus rhythm No significant change since last tracing Confirmed by Dorie Rank [phone]) on 10/30/2018 11:12:39 AM   Radiology Dg Chest 2 View  Result Date: 10/30/2018 CLINICAL DATA:  Cough.  Seizure. EXAM: CHEST - 2 VIEW COMPARISON:  10/04/2018 and 07/31/2018 FINDINGS: Heart size and pulmonary vascularity are normal. There is slight chronic accentuation of the interstitial markings at the left lung base, stable. Lungs are otherwise clear except for peribronchial thickening on the lateral view. No effusions. No acute bone abnormality. IMPRESSION: Slight bronchitic changes. Electronically Signed   By: Lorriane Shire M.D.   On: 10/30/2018 11:19   Ct Head Wo Contrast  Result Date: 10/30/2018 CLINICAL DATA:  Altered mental status. EXAM: CT HEAD WITHOUT CONTRAST TECHNIQUE: Contiguous axial images were obtained from the base of the skull through the vertex without intravenous contrast. COMPARISON:  CT head dated Feb 17, 2018. FINDINGS: Brain: No evidence of acute infarction, hemorrhage, hydrocephalus, extra-axial collection or mass lesion/mass effect. Stable mild to moderate atrophy and mild chronic microvascular ischemic changes. Vascular: Atherosclerotic vascular calcification of the carotid siphons. No hyperdense vessel. Skull: Normal. Negative for fracture or focal lesion. Sinuses/Orbits: No acute finding. Other: None. IMPRESSION: 1.  No acute intracranial abnormality. 2. Stable atrophy and chronic microvascular ischemic changes. Electronically Signed   By: Titus Dubin M.D.   On: 10/30/2018 11:36    Procedures Procedures (including critical care time)  Medications Ordered in ED Medications  sodium chloride 0.9 % bolus 500 mL (0 mLs Intravenous Stopped 10/30/18 1211)     Followed by  0.9 %  sodium chloride infusion (0 mLs Intravenous Stopped 10/30/18 1253)  piperacillin-tazobactam (ZOSYN) IVPB 3.375 g (has no administration in time range)  ipratropium-albuterol (DUONEB) 0.5-2.5 (3) MG/3ML nebulizer solution 3 mL (3 mLs Nebulization Given 10/30/18 1142)     Initial Impression / Assessment and Plan / ED Course  I have reviewed the triage vital signs and the nursing notes.  Pertinent labs & imaging results that were available during my care of the patient were reviewed by me and considered in my medical decision making (see chart for details).   Patient presented to the emergency room for evaluation of facial swelling.  While he was in the waiting room he had what appears to be a syncopal episode rather than a true seizure.  Patient had significant swelling of his upper lip.  He does have some surrounding erythema.  Patient's exam favors  cellulitis rather than angioedema.  Considering his poor overall health, his leukocytosis and the syncopal episode I will start him on antibiotics and consult the medical service for admission and observation.  Final Clinical Impressions(s) / ED Diagnoses   Final diagnoses:  Syncope, unspecified syncope type  Cellulitis of face  Angioedema, initial encounter      Dorie Rank, MD 10/30/18 1409

## 2018-10-30 NOTE — Progress Notes (Signed)
Pharmacy Antibiotic Note  Edward [name] is a 84 y.o. male admitted on 10/30/2018 with cellulitis.  Pharmacy has been consulted for zosyn and vancomycin dosing.  Plan: Vancomycin 1500mg  IV every 24 hours.  Goal trough 10-15 mcg/mL. Zosyn 3.375g IV q8h (4 hour infusion).  Height: 5\' 5"  (165.1 cm) Weight: 185 lb (83.9 kg) IBW/kg (Calculated) : 61.5  Temp (24hrs), Avg:100.6 F (38.1 C), Min:98.2 F (36.8 C), Max:102.9 F (39.4 C)  Recent Labs  Lab 10/30/18 1032 10/30/18 1217 10/30/18 1354  WBC 20.4*  --   --   CREATININE 1.10  --   --   LATICACIDVEN  --  1.7 2.0*    Estimated Creatinine Clearance: 52.5 mL/min (by C-G formula based on SCr of 1.1 mg/dL).    Allergies  Allergen Reactions  . Ace Inhibitors Other (See Comments)    Hyperkalemia--07/23/2013:patient states not familiar with the following allergy    Antimicrobials this admission: 1/29 zosyn >>  1/29vancomycin  >>  Microbiology results: 1/29 BCx: sent  Thank you for allowing pharmacy to be a part of this patient's care.  Donna Christen Khloee Garza 10/30/2018 6:29 PM

## 2018-10-30 NOTE — ED Triage Notes (Signed)
Patient complaining of upper lip swelling x 2-3 days. No complaints of difficulty breathing or swallowing. NAD noted at triage.

## 2018-10-30 NOTE — ED Notes (Signed)
Pt is awake a&o x4.  Pt is shaking all over but reports that he shakes all the time.

## 2018-10-31 ENCOUNTER — Ambulatory Visit: Payer: Self-pay

## 2018-10-31 DIAGNOSIS — I480 Paroxysmal atrial fibrillation: Secondary | ICD-10-CM | POA: Diagnosis not present

## 2018-10-31 DIAGNOSIS — J9611 Chronic respiratory failure with hypoxia: Secondary | ICD-10-CM | POA: Diagnosis not present

## 2018-10-31 DIAGNOSIS — E11649 Type 2 diabetes mellitus with hypoglycemia without coma: Secondary | ICD-10-CM

## 2018-10-31 DIAGNOSIS — R55 Syncope and collapse: Secondary | ICD-10-CM | POA: Diagnosis not present

## 2018-10-31 DIAGNOSIS — I5032 Chronic diastolic (congestive) heart failure: Secondary | ICD-10-CM | POA: Diagnosis not present

## 2018-10-31 DIAGNOSIS — L03211 Cellulitis of face: Secondary | ICD-10-CM | POA: Diagnosis not present

## 2018-10-31 LAB — COMPREHENSIVE METABOLIC PANEL
ALK PHOS: 56 U/L (ref 38–126)
ALT: 14 U/L (ref 0–44)
ANION GAP: 6 (ref 5–15)
AST: 15 U/L (ref 15–41)
Albumin: 2.3 g/dL — ABNORMAL LOW (ref 3.5–5.0)
BILIRUBIN TOTAL: 0.3 mg/dL (ref 0.3–1.2)
BUN: 30 mg/dL — ABNORMAL HIGH (ref 8–23)
CALCIUM: 7.6 mg/dL — AB (ref 8.9–10.3)
CO2: 28 mmol/L (ref 22–32)
Chloride: 102 mmol/L (ref 98–111)
Creatinine, Ser: 1.11 mg/dL (ref 0.61–1.24)
GFR calc Af Amer: >60 mL/min (ref >60)
GFR calc non Af Amer: >60 mL/min (ref >60)
Glucose, Bld: 48 mg/dL — ABNORMAL LOW (ref 70–99)
POTASSIUM: 3.3 mmol/L — AB (ref 3.5–5.1)
Sodium: 136 mmol/L (ref 135–145)
TOTAL PROTEIN: 4.7 g/dL — AB (ref 6.5–8.1)

## 2018-10-31 LAB — GLUCOSE, CAPILLARY
GLUCOSE-CAPILLARY: 86 mg/dL (ref 70–99)
Glucose-Capillary: 31 mg/dL — CL (ref 70–99)
Glucose-Capillary: 46 mg/dL — ABNORMAL LOW (ref 70–99)
Glucose-Capillary: 205 mg/dL — ABNORMAL HIGH (ref 70–99)
Glucose-Capillary: 163 mg/dL — ABNORMAL HIGH (ref 70–99)
Glucose-Capillary: 194 mg/dL — ABNORMAL HIGH (ref 70–99)

## 2018-10-31 LAB — CBC
HCT: 31.5 % — ABNORMAL LOW (ref 39.0–52.0)
Hemoglobin: 9.6 g/dL — ABNORMAL LOW (ref 13.0–17.0)
MCH: 27.4 pg (ref 26.0–34.0)
MCHC: 30.5 g/dL (ref 30.0–36.0)
MCV: 89.7 fL (ref 80.0–100.0)
Platelets: 357 10*3/uL (ref 150–400)
RBC: 3.51 MIL/uL — ABNORMAL LOW (ref 4.22–5.81)
RDW: 15 % (ref 11.5–15.5)
WBC: 16.9 10*3/uL — AB (ref 4.0–10.5)
nRBC: 0 % (ref 0.0–0.2)

## 2018-10-31 MED ORDER — INSULIN GLARGINE 100 UNIT/ML ~~LOC~~ SOLN
10.0000 [IU] | Freq: Two times a day (BID) | SUBCUTANEOUS | Status: DC
  Administered 2018-10-31: 10 [IU] via SUBCUTANEOUS
  Filled 2018-10-31 (×2): qty 0.1

## 2018-10-31 MED ORDER — INSULIN ASPART 100 UNIT/ML ~~LOC~~ SOLN: 0.0000 [IU] | Freq: Every day | SUBCUTANEOUS | Status: DC

## 2018-10-31 MED ORDER — INSULIN ASPART 100 UNIT/ML ~~LOC~~ SOLN
0.0000 [IU] | Freq: Three times a day (TID) | SUBCUTANEOUS | Status: DC
  Administered 2018-10-31: 3 [IU] via SUBCUTANEOUS
  Administered 2018-10-31: 2 [IU] via SUBCUTANEOUS

## 2018-10-31 NOTE — Progress Notes (Signed)
New IV attempted by Pia Mau [facility] SRN in right ac. Unsuccessful. Pt tolerated well. No distress.

## 2018-10-31 NOTE — Care Management Obs Status (Signed)
[facility] NOTIFICATION   Patient Details  Name: Edward [name] MRN: [id number] Date of Birth: 07-Oct-1934   Medicare Observation Status Notification Given:  Yes    Tommy Medal 10/31/2018, 1:31 PM

## 2018-10-31 NOTE — Progress Notes (Signed)
Inpatient Diabetes Program Recommendations  AACE/ADA: New Consensus Statement on Inpatient Glycemic Control (2015)  Target Ranges:  Prepandial:   less than 140 mg/dL      Peak postprandial:   less than 180 mg/dL (1-2 hours)      Critically ill patients:  140 - 180 mg/dL   Lab Results  Component Value Date   GLUCAP 86 10/31/2018   HGBA1C 8.1 (H) 09/09/2018    Review of Glycemic Control Results for Edward [name], Edward [name] (MRN [id number]) as of 10/31/2018 09:30  Ref. Range 10/30/2018 16:32 10/30/2018 21:41 10/31/2018 07:31 10/31/2018 07:52 10/31/2018 08:25  Glucose-Capillary Latest Ref Range: 70 - 99 mg/dL 73 92 31 (LL) 46 (L) 86   Diabetes history: DM2 Outpatient Diabetes medications: Basaglar 20 units bid Current orders for Inpatient glycemic control: Lantus 20 units bid + Novolog resistant correction scale + hs 0-5 units  Inpatient Diabetes Program Recommendations:   Noted hypoglycemia @ 31. -Decrease Lantus to 10 units bid starting in pm -Decrease Novolog correction scale to sensitive tid  Thank you, Nani Gasser. Ariona Deschene, RN, MSN, CDE  Diabetes Coordinator Inpatient Glycemic Control Team Team Pager [phone] (8am-5pm) 10/31/2018 9:32 AM

## 2018-10-31 NOTE — Progress Notes (Signed)
PROGRESS NOTE    Edward [name]  LDJ:[id number] DOB: 17-Sep-1935 DOA: 10/30/2018 PCP: Susy Frizzle, MD    Brief Narrative:  84 year old male with history of chronic respiratory failure on 3 L of oxygen, COPD, atrial fibrillation, diabetes, admitted to the hospital with upper lip cellulitis, fevers and had a syncopal event in the emergency room waiting area.  He has been started on intravenous antibiotic with mild improvement.  Anticipate he will be in the hospital another 1 to 2 days.   Assessment & Plan:   Principal Problem:   Facial cellulitis Active Problems:   Diabetes mellitus type 2, uncontrolled ([name])   COPD (chronic obstructive pulmonary disease) ([name])   Hypertension   Gastroesophageal reflux disease   Atrial fibrillation ([name])   Chronic diastolic heart failure ([name])   Syncope   Chronic respiratory failure with hypoxia ([name])   1. Facial cellulitis.  Continues to have fevers overnight.  Overall he feels that upper lip is less tender and swelling has mildly improved, although still present.  Erythema is mildly better, but still present.  Continue on intravenous antibiotics for now.  Blood cultures show no growth. 2. Syncope.  Patient had a syncopal event in the emergency room waiting area.  This was likely related to infection/volume depletion.  He is not having neurologic deficits, dizziness, chest pain or shortness of breath.  Continue to monitor. 3. Diabetes with hypoglycemia.  Patient was continued on home dose of basal insulin at 20 units twice daily.  He was also started on sliding scale.  He did have episodes of hypoglycemia this morning.  Basal insulin dose has been reduced. 4. Atrial fibrillation, paroxysmal.  Currently in sinus rhythm.  Continue diltiazem and metoprolol for rate control.  Anticoagulated with apixaban. 5. Hypertension.  Blood pressure currently stable on clonidine, diltiazem and metoprolol.  Continue to monitor. 6. COPD with chronic respiratory  failure on 3 L of oxygen.  Recently had an exacerbation and had recently finished up a prednisone taper.  No wheezing at this time.  Continue bronchodilators as needed. 7. Chronic diastolic heart failure.  Holding Lasix at this time since he was mildly dehydrated.  Continue to monitor volume status with hydration.   DVT prophylaxis: Apixaban Code Status: Full code Family Communication: No family present Disposition Plan: Discharge home once cellulitis has improved and he is afebrile   Consultants:     Procedures:     Antimicrobials:   Vancomycin 1/29 >  Zosyn 1/29 >   Subjective: Feels that upper lip is less painful, still swollen but feels that it is improving.  Patient continues to have fevers in the last 24 hours.  Objective: Vitals:   10/31/18 0803 10/31/18 0809 10/31/18 1445 10/31/18 1451  BP:    (!) 142/69  Pulse:    77  Resp:    20  Temp:    99.5 F (37.5 C)  TempSrc:    Oral  SpO2: 100% 100% 98% 100%  Weight:      Height:        Intake/Output Summary (Last 24 hours) at 10/31/2018 1723 Last data filed at 10/31/2018 1500 Gross per 24 hour  Intake 2226.61 ml  Output -  Net 2226.61 ml   Filed Weights   10/30/18 0941 10/30/18 1844  Weight: 83.9 kg 81.9 kg    Examination:  General exam: Appears calm and comfortable, upper lip swellin mildly better today, continues to have erythema  Respiratory system: Minimal wheeze bilaterally. Respiratory effort normal. Cardiovascular system:  S1 & S2 heard, irregular. No JVD, murmurs, rubs, gallops or clicks. No pedal edema. Gastrointestinal system: Abdomen is nondistended, soft and nontender. No organomegaly or masses felt. Normal bowel sounds heard. Central nervous system: Alert and oriented. No focal neurological deficits. Extremities: Symmetric 5 x 5 power. Skin: No rashes, lesions or ulcers Psychiatry: Judgement and insight appear normal. Mood & affect appropriate.     Data Reviewed: I have personally reviewed  following labs and imaging studies  CBC: Recent Labs  Lab 10/30/18 1032 10/31/18 0559  WBC 20.4* 16.9*  HGB 11.6* 9.6*  HCT 36.5* 31.5*  MCV 86.5 89.7  PLT 410* 025   Basic Metabolic Panel: Recent Labs  Lab 10/30/18 1032 10/31/18 0538  NA 131* 136  K 3.5 3.3*  CL 91* 102  CO2 30 28  GLUCOSE 201* 48*  BUN 42* 30*  CREATININE 1.10 1.11  CALCIUM 8.7* 7.6*   GFR: Estimated Creatinine Clearance: 51.5 mL/min (by C-G formula based on SCr of 1.11 mg/dL). Liver Function Tests: Recent Labs  Lab 10/31/18 0538  AST 15  ALT 14  ALKPHOS 56  BILITOT 0.3  PROT 4.7*  ALBUMIN 2.3*   No results for input(s): LIPASE, AMYLASE in the last 168 hours. No results for input(s): AMMONIA in the last 168 hours. Coagulation Profile: Recent Labs  Lab 10/30/18 1217  INR 1.01   Cardiac Enzymes: No results for input(s): CKTOTAL, CKMB, CKMBINDEX, TROPONINI in the last 168 hours. BNP (last 3 results) No results for input(s): PROBNP in the last 8760 hours. HbA1C: No results for input(s): HGBA1C in the last 72 hours. CBG: Recent Labs  Lab 10/31/18 0731 10/31/18 0752 10/31/18 0825 10/31/18 1102 10/31/18 1606  GLUCAP 31* 46* 86 205* 163*   Lipid Profile: No results for input(s): CHOL, HDL, LDLCALC, TRIG, CHOLHDL, LDLDIRECT in the last 72 hours. Thyroid Function Tests: No results for input(s): TSH, T4TOTAL, FREET4, T3FREE, THYROIDAB in the last 72 hours. Anemia Panel: No results for input(s): VITAMINB12, FOLATE, FERRITIN, TIBC, IRON, RETICCTPCT in the last 72 hours. Sepsis Labs: Recent Labs  Lab 10/30/18 1217 10/30/18 1354  LATICACIDVEN 1.7 2.0*    Recent Results (from the past 240 hour(s))  Culture, blood (routine x 2)     Status: None (Preliminary result)   Collection Time: 10/30/18  7:06 PM  Result Value Ref Range Status   Specimen Description LEFT ANTECUBITAL  Final   Special Requests   Final    BOTTLES DRAWN AEROBIC AND ANAEROBIC Blood Culture adequate volume    Culture   Final    NO GROWTH < 12 HOURS Performed at [facility], [address], [place], [place] [address]    Report Status PENDING  Incomplete  Culture, blood (routine x 2)     Status: None (Preliminary result)   Collection Time: 10/30/18  7:06 PM  Result Value Ref Range Status   Specimen Description BLOOD LEFT HAND  Final   Special Requests   Final    BOTTLES DRAWN AEROBIC AND ANAEROBIC Blood Culture adequate volume   Culture   Final    NO GROWTH < 12 HOURS Performed at [facility], [address], [place], [place] [address]    Report Status PENDING  Incomplete         Radiology Studies: Dg Chest 2 View  Result Date: 10/30/2018 CLINICAL DATA:  Cough.  Seizure. EXAM: CHEST - 2 VIEW COMPARISON:  10/04/2018 and 07/31/2018 FINDINGS: Heart size and pulmonary vascularity are normal. There is slight chronic accentuation of  the interstitial markings at the left lung base, stable. Lungs are otherwise clear except for peribronchial thickening on the lateral view. No effusions. No acute bone abnormality. IMPRESSION: Slight bronchitic changes. Electronically Signed   By: Lorriane Shire M.D.   On: 10/30/2018 11:19   Ct Head Wo Contrast  Result Date: 10/30/2018 CLINICAL DATA:  Altered mental status. EXAM: CT HEAD WITHOUT CONTRAST TECHNIQUE: Contiguous axial images were obtained from the base of the skull through the vertex without intravenous contrast. COMPARISON:  CT head dated Feb 17, 2018. FINDINGS: Brain: No evidence of acute infarction, hemorrhage, hydrocephalus, extra-axial collection or mass lesion/mass effect. Stable mild to moderate atrophy and mild chronic microvascular ischemic changes. Vascular: Atherosclerotic vascular calcification of the carotid siphons. No hyperdense vessel. Skull: Normal. Negative for fracture or focal lesion. Sinuses/Orbits: No acute finding. Other: None. IMPRESSION: 1.  No acute intracranial abnormality. 2. Stable atrophy and chronic microvascular ischemic  changes. Electronically Signed   By: Titus Dubin M.D.   On: 10/30/2018 11:36        Scheduled Meds: . apixaban  2.5 mg Oral BID  . aspirin EC  81 mg Oral Daily  . budesonide (PULMICORT) nebulizer solution  0.25 mg Nebulization BID  . cloNIDine  0.1 mg Oral BID  . diltiazem  240 mg Oral Daily  . insulin aspart  0-5 Units Subcutaneous QHS  . insulin aspart  0-9 Units Subcutaneous TID WC  . insulin glargine  10 Units Subcutaneous BID  . ipratropium-albuterol  3 mL Nebulization Q6H  . metoprolol tartrate  25 mg Oral BID  . pravastatin  80 mg Oral QHS   Continuous Infusions: . sodium chloride Stopped (10/30/18 1713)  . sodium chloride 75 mL/hr at 10/31/18 1435  . piperacillin-tazobactam (ZOSYN)  IV 3.375 g (10/31/18 1433)  . vancomycin       LOS: 0 days    Time spent: 51mins    Kathie Dike, MD [facility]   If 7PM-7AM, please contact night-coverage [email or web address]  10/31/2018, 5:23 PM

## 2018-11-01 ENCOUNTER — Other Ambulatory Visit: Payer: Self-pay | Admitting: Family Medicine

## 2018-11-01 ENCOUNTER — Other Ambulatory Visit: Payer: Self-pay

## 2018-11-01 DIAGNOSIS — K1120 Sialoadenitis, unspecified: Secondary | ICD-10-CM

## 2018-11-01 DIAGNOSIS — K219 Gastro-esophageal reflux disease without esophagitis: Secondary | ICD-10-CM

## 2018-11-01 DIAGNOSIS — I5032 Chronic diastolic (congestive) heart failure: Secondary | ICD-10-CM | POA: Diagnosis not present

## 2018-11-01 DIAGNOSIS — J439 Emphysema, unspecified: Secondary | ICD-10-CM | POA: Diagnosis not present

## 2018-11-01 DIAGNOSIS — J9611 Chronic respiratory failure with hypoxia: Secondary | ICD-10-CM | POA: Diagnosis not present

## 2018-11-01 DIAGNOSIS — I480 Paroxysmal atrial fibrillation: Secondary | ICD-10-CM | POA: Diagnosis not present

## 2018-11-01 LAB — CBC
HCT: 28.4 % — ABNORMAL LOW (ref 39.0–52.0)
Hemoglobin: 9 g/dL — ABNORMAL LOW (ref 13.0–17.0)
MCH: 28.2 pg (ref 26.0–34.0)
MCHC: 31.7 g/dL (ref 30.0–36.0)
MCV: 89 fL (ref 80.0–100.0)
Platelets: 297 10*3/uL (ref 150–400)
RBC: 3.19 MIL/uL — ABNORMAL LOW (ref 4.22–5.81)
RDW: 14.7 % (ref 11.5–15.5)
WBC: 12.1 10*3/uL — ABNORMAL HIGH (ref 4.0–10.5)
nRBC: 0 % (ref 0.0–0.2)

## 2018-11-01 LAB — COMPREHENSIVE METABOLIC PANEL
ALT: 15 U/L (ref 0–44)
AST: 14 U/L — ABNORMAL LOW (ref 15–41)
Albumin: 2.1 g/dL — ABNORMAL LOW (ref 3.5–5.0)
Alkaline Phosphatase: 57 U/L (ref 38–126)
Anion gap: 9 (ref 5–15)
BUN: 26 mg/dL — AB (ref 8–23)
CO2: 26 mmol/L (ref 22–32)
CREATININE: 1.22 mg/dL (ref 0.61–1.24)
Calcium: 7.6 mg/dL — ABNORMAL LOW (ref 8.9–10.3)
Chloride: 100 mmol/L (ref 98–111)
GFR calc Af Amer: >60 mL/min (ref >60)
GFR, EST NON AFRICAN AMERICAN: 55 mL/min — AB (ref >60)
Glucose, Bld: 71 mg/dL (ref 70–99)
Potassium: 3.5 mmol/L (ref 3.5–5.1)
Sodium: 135 mmol/L (ref 135–145)
Total Bilirubin: 0.3 mg/dL (ref 0.3–1.2)
Total Protein: 4.7 g/dL — ABNORMAL LOW (ref 6.5–8.1)

## 2018-11-01 LAB — GLUCOSE, CAPILLARY
Glucose-Capillary: 63 mg/dL — ABNORMAL LOW (ref 70–99)
Glucose-Capillary: 89 mg/dL (ref 70–99)

## 2018-11-01 MED ORDER — BASAGLAR KWIKPEN 100 UNIT/ML ~~LOC~~ SOPN: 10.0000 [IU] | PEN_INJECTOR | Freq: Every day | SUBCUTANEOUS | Status: DC

## 2018-11-01 MED ORDER — CLINDAMYCIN HCL 150 MG PO CAPS: 450.0000 mg | ORAL_CAPSULE | Freq: Three times a day (TID) | ORAL | 0 refills | Status: AC

## 2018-11-01 MED ORDER — CIPROFLOXACIN HCL 500 MG PO TABS: 500.0000 mg | ORAL_TABLET | Freq: Two times a day (BID) | ORAL | 0 refills | Status: AC

## 2018-11-01 MED ORDER — MOMETASONE FURO-FORMOTEROL FUM 200-5 MCG/ACT IN AERO: 2.0000 | INHALATION_SPRAY | Freq: Two times a day (BID) | RESPIRATORY_TRACT | 5 refills | Status: DC

## 2018-11-01 MED ORDER — ALIGN EXTRA STRENGTH PO CAPS: 1.0000 | ORAL_CAPSULE | Freq: Three times a day (TID) | ORAL | 0 refills | Status: AC

## 2018-11-01 MED ORDER — TIOTROPIUM BROMIDE MONOHYDRATE 2.5 MCG/ACT IN AERS: 2.0000 | INHALATION_SPRAY | Freq: Every day | RESPIRATORY_TRACT | 5 refills | Status: DC

## 2018-11-01 MED ORDER — INSULIN GLARGINE 100 UNIT/ML ~~LOC~~ SOLN
10.0000 [IU] | Freq: Every day | SUBCUTANEOUS | Status: DC
  Filled 2018-11-01 (×3): qty 0.1

## 2018-11-01 NOTE — Discharge Summary (Addendum)
Physician Discharge Summary  CHAN SHEAHAN PIR:[id number] DOB: [birth year] DOA: 10/30/2018  PCP: Susy Frizzle, MD  Admit date: 10/30/2018 Discharge date: 11/01/2018  Admitted From: Home  Disposition: Home   Recommendations for Outpatient Follow-up:  1. Follow up with PCP in 5 days 2. Establish care with ENT Dr. Benjamine Mola in 1 week for recheck of parotitis  Discharge Condition: STABLE   CODE STATUS: FULL    Brief Hospitalization Summary: Please see all hospital notes, images, labs for full details of the hospitalization. HPI: Edward [name] is a 84 y.o. male with medical history significant of COPD, diastolic heart failure, atrial fibrillation on anticoagulation, chronic respiratory failure on home oxygen 3 L, presents to the emergency room with complaints of upper lip swelling.  Reports onset of symptoms occurring 2 to 3 days ago.  Denies any specific trauma to this area.  He has not noticed any insect bites.  He did not fall recently.  He says is been several days since he shaved.  Has not noticed any drainage from any lesions in his mouth or on his leg.  Today, he developed tenderness to touch on his upper lip which is why he came to the emergency room.  While in the waiting room, patient briefly became less responsive.  There was concern that he may have syncopized versus seizure.  Patient was brought back into examination room where he was back to his normal self and did not appear to have any postictal phase.  Per report from family members, he apparently tilted his head back and there was some shaking.  Patient quickly regained consciousness and was back to his baseline.  Since arrival to the hospital, the patient has had a fever of 102.9.  Blood pressures have been stable in the 150s range.  He was noted to have a significant leukocytosis of 20,000, but review of records indicate that he recently been taking steroids for COPD exacerbation.  Other labs indicate an elevated BUN of 42.  Creatinine  is normal.  Lactic acid mildly elevated at 2.0.  Urinalysis does not show any signs of infection.  CT head was unrevealing.  Chest x-ray did not show any evidence of pneumonia.  Patient was started on intravenous antibiotics due to concerns for developing pneumonia.  He is been referred for admission.  Brief Narrative:  84 year old male with history of chronic respiratory failure on 3 L of oxygen, COPD, atrial fibrillation, diabetes, admitted to the hospital with upper lip cellulitis, fevers and had a syncopal event in the emergency room waiting area.  He has been started on intravenous antibiotic with improvement  Assessment & Plan:   Principal Problem:   Facial cellulitis Active Problems:   Diabetes mellitus type 2, uncontrolled ([name])   COPD (chronic obstructive pulmonary disease) ([name])   Hypertension   Gastroesophageal reflux disease   Atrial fibrillation ([name])   Chronic diastolic heart failure ([name])   Syncope   Chronic respiratory failure with hypoxia ([place])   1. Facial cellulitis / Acute parotitis. Clinically he is much improved today, his white blood cell count is improving and he is afebrile.  He is eating and drinking well.  He is asking to go home today.  I have asked him to follow-up with his primary care physician early next week and also asking him to call and establish care with ENT in about 1 week for recheck of his parotitis.  Probiotics prescribed.  Cipro 500 BID plus clindamycin 450 TID x 7 days to  complete course. Close outpatient follow up strongly advised.  Return if symptoms come back, worsen or new problems develop.  2. Syncope.  Patient had a syncopal event in the emergency room waiting area.  This was likely related to infection/volume depletion.  He is not having neurologic deficits, dizziness, chest pain or shortness of breath.  He has had no recurrence,  Follow up with PCP to recheck.  3. Diabetes with hypoglycemia.  I have reduce his insulin to once daily 10  units.  4. Atrial fibrillation, paroxysmal.  Currently in sinus rhythm.  Continue diltiazem and metoprolol for rate control.  Anticoagulated with apixaban. 5. Hypertension.  Blood pressure currently stable on clonidine, diltiazem and metoprolol.  Continue to monitor. 6. COPD with chronic respiratory failure on 3 L of oxygen.  Recently had an exacerbation and had recently finished up a prednisone taper.  No wheezing at this time.  Continue bronchodilators as needed. 7. Chronic diastolic heart failure.  Resume home Lasix /potassium at discharge.  He was rehydrated with IV fluids.    DVT prophylaxis: Apixaban Code Status: Full code Family Communication: No family present Disposition Plan: Home    Consultants:     Procedures:     Antimicrobials:   Vancomycin 1/29 >1/31  Zosyn 1/29 >1/31  Discharge Diagnoses:  Principal Problem:   Parotitis Active Problems:   Diabetes mellitus type 2, uncontrolled ([name])   COPD (chronic obstructive pulmonary disease) ([name])   Hypertension   Gastroesophageal reflux disease   Atrial fibrillation ([name])   Chronic diastolic heart failure ([name])   Syncope   Facial cellulitis   Chronic respiratory failure with hypoxia [facility])   Discharge Instructions: Discharge Instructions    Call MD for:  difficulty breathing, headache or visual disturbances   Complete by:  As directed    Call MD for:  extreme fatigue   Complete by:  As directed    Call MD for:  persistant dizziness or light-headedness   Complete by:  As directed    Call MD for:  persistant nausea and vomiting   Complete by:  As directed    Increase activity slowly   Complete by:  As directed      Allergies as of 11/01/2018      Reactions   Ace Inhibitors Other (See Comments)   Hyperkalemia--07/23/2013:patient states not familiar with the following allergy      Medication List    STOP taking these medications   albuterol (2.5 MG/3ML) 0.083% nebulizer solution Commonly known  as:  PROVENTIL   predniSONE 20 MG tablet Commonly known as:  DELTASONE   TRELEGY ELLIPTA 100-62.5-25 MCG/INH Aepb Generic drug:  Fluticasone-Umeclidin-Vilant     TAKE these medications   ALIGN EXTRA STRENGTH Caps Take 1 capsule by mouth 3 (three) times daily before meals for 14 days.   apixaban 2.5 MG Tabs tablet Commonly known as:  ELIQUIS Take 1 tablet (2.5 mg total) by mouth 2 (two) times daily. Restart on 7/24   aspirin EC 81 MG tablet Take 1 tablet (81 mg total) by mouth daily. Resume on 7/24   BASAGLAR KWIKPEN 100 UNIT/ML Sopn Inject 0.1 mLs (10 Units total) into the skin daily. What changed:    how much to take  when to take this   ciprofloxacin 500 MG tablet Commonly known as:  CIPRO Take 1 tablet (500 mg total) by mouth 2 (two) times daily for 7 days.   clindamycin 150 MG capsule Commonly known as:  CLEOCIN Take 3 capsules (450 mg  total) by mouth 3 (three) times daily for 7 days.   cloNIDine 0.1 MG tablet Commonly known as:  CATAPRES Take 1 tablet (0.1 mg total) by mouth 2 (two) times daily.   diltiazem 240 MG 24 hr capsule Commonly known as:  CARDIZEM CD TAKE 1 CAPSULE BY MOUTH DAILY   furosemide 40 MG tablet Commonly known as:  LASIX Take 1 tablet (40 mg total) by mouth daily.   glucose blood test strip Commonly known as:  ONE TOUCH ULTRA TEST CHECK FASTING BLOOD SUGAR TWICE DAILY   ipratropium 0.02 % nebulizer solution Commonly known as:  ATROVENT Inhale 3 mLs into the lungs every 6 (six) hours as needed.   metoprolol tartrate 25 MG tablet Commonly known as:  LOPRESSOR TAKE 1 TABLET BY MOUTH TWICE DAILY   ONETOUCH DELICA LANCETS 53I Misc USE TO CHECK BLOOD SUGAR TWICE DAILY AS DIRECTED   OXYGEN Inhale 3 L into the lungs continuous.   polyethylene glycol packet Commonly known as:  MIRALAX / GLYCOLAX Take 17 g by mouth daily as needed for mild constipation or moderate constipation.   potassium chloride SA 20 MEQ tablet Commonly known  as:  K-DUR,KLOR-CON Take 1 tablet (20 mEq total) by mouth daily. When he takes lasix   pravastatin 80 MG tablet Commonly known as:  PRAVACHOL TAKE 1 TABLET BY MOUTH AT BEDTIME   Tiotropium Bromide-Olodaterol 2.5-2.5 MCG/ACT Aers Commonly known as:  STIOLTO RESPIMAT Inhale 2 Inhalers into the lungs daily.      Follow-up Information    Susy Frizzle, MD Follow up in 5 day(s).   Specialty:  Family Medicine Why:  Hospital Follow Up  Contact information: [address] [place] [address] [phone]        Leta Baptist, MD. Schedule an appointment as soon as possible for a visit in 1 week(s).   Specialty:  Otolaryngology Why:  Hospital Follow Up for parotitis Contact information: [address] [place] [address] [phone]          Allergies  Allergen Reactions  . Ace Inhibitors Other (See Comments)    Hyperkalemia--07/23/2013:patient states not familiar with the following allergy   Allergies as of 11/01/2018      Reactions   Ace Inhibitors Other (See Comments)   Hyperkalemia--07/23/2013:patient states not familiar with the following allergy      Medication List    STOP taking these medications   albuterol (2.5 MG/3ML) 0.083% nebulizer solution Commonly known as:  PROVENTIL   predniSONE 20 MG tablet Commonly known as:  DELTASONE   TRELEGY ELLIPTA 100-62.5-25 MCG/INH Aepb Generic drug:  Fluticasone-Umeclidin-Vilant     TAKE these medications   ALIGN EXTRA STRENGTH Caps Take 1 capsule by mouth 3 (three) times daily before meals for 14 days.   apixaban 2.5 MG Tabs tablet Commonly known as:  ELIQUIS Take 1 tablet (2.5 mg total) by mouth 2 (two) times daily. Restart on 7/24   aspirin EC 81 MG tablet Take 1 tablet (81 mg total) by mouth daily. Resume on 7/24   BASAGLAR KWIKPEN 100 UNIT/ML Sopn Inject 0.1 mLs (10 Units total) into the skin daily. What changed:    how much to take  when to take this   ciprofloxacin 500 MG  tablet Commonly known as:  CIPRO Take 1 tablet (500 mg total) by mouth 2 (two) times daily for 7 days.   clindamycin 150 MG capsule Commonly known as:  CLEOCIN Take 3 capsules (450 mg total)  by mouth 3 (three) times daily for 7 days.   cloNIDine 0.1 MG tablet Commonly known as:  CATAPRES Take 1 tablet (0.1 mg total) by mouth 2 (two) times daily.   diltiazem 240 MG 24 hr capsule Commonly known as:  CARDIZEM CD TAKE 1 CAPSULE BY MOUTH DAILY   furosemide 40 MG tablet Commonly known as:  LASIX Take 1 tablet (40 mg total) by mouth daily.   glucose blood test strip Commonly known as:  ONE TOUCH ULTRA TEST CHECK FASTING BLOOD SUGAR TWICE DAILY   ipratropium 0.02 % nebulizer solution Commonly known as:  ATROVENT Inhale 3 mLs into the lungs every 6 (six) hours as needed.   metoprolol tartrate 25 MG tablet Commonly known as:  LOPRESSOR TAKE 1 TABLET BY MOUTH TWICE DAILY   ONETOUCH DELICA LANCETS 02R Misc USE TO CHECK BLOOD SUGAR TWICE DAILY AS DIRECTED   OXYGEN Inhale 3 L into the lungs continuous.   polyethylene glycol packet Commonly known as:  MIRALAX / GLYCOLAX Take 17 g by mouth daily as needed for mild constipation or moderate constipation.   potassium chloride SA 20 MEQ tablet Commonly known as:  K-DUR,KLOR-CON Take 1 tablet (20 mEq total) by mouth daily. When he takes lasix   pravastatin 80 MG tablet Commonly known as:  PRAVACHOL TAKE 1 TABLET BY MOUTH AT BEDTIME   Tiotropium Bromide-Olodaterol 2.5-2.5 MCG/ACT Aers Commonly known as:  STIOLTO RESPIMAT Inhale 2 Inhalers into the lungs daily.       Procedures/Studies: Dg Chest 2 View  Result Date: 10/30/2018 CLINICAL DATA:  Cough.  Seizure. EXAM: CHEST - 2 VIEW COMPARISON:  10/04/2018 and 07/31/2018 FINDINGS: Heart size and pulmonary vascularity are normal. There is slight chronic accentuation of the interstitial markings at the left lung base, stable. Lungs are otherwise clear except for peribronchial  thickening on the lateral view. No effusions. No acute bone abnormality. IMPRESSION: Slight bronchitic changes. Electronically Signed   By: Lorriane Shire M.D.   On: 10/30/2018 11:19   Dg Chest 2 View  Result Date: 10/04/2018 CLINICAL DATA:  Shortness of breath. EXAM: CHEST - 2 VIEW COMPARISON:  PA and lateral chest 07/31/2018 and 09/25/2017. FINDINGS: The lungs are emphysematous. No consolidative process, pneumothorax or effusion. Aortic atherosclerosis is noted. Heart size is normal. No acute or focal bony abnormality. IMPRESSION: Emphysema without acute disease. Electronically Signed   By: Inge Rise M.D.   On: 10/04/2018 20:27   Ct Head Wo Contrast  Result Date: 10/30/2018 CLINICAL DATA:  Altered mental status. EXAM: CT HEAD WITHOUT CONTRAST TECHNIQUE: Contiguous axial images were obtained from the base of the skull through the vertex without intravenous contrast. COMPARISON:  CT head dated Feb 17, 2018. FINDINGS: Brain: No evidence of acute infarction, hemorrhage, hydrocephalus, extra-axial collection or mass lesion/mass effect. Stable mild to moderate atrophy and mild chronic microvascular ischemic changes. Vascular: Atherosclerotic vascular calcification of the carotid siphons. No hyperdense vessel. Skull: Normal. Negative for fracture or focal lesion. Sinuses/Orbits: No acute finding. Other: None. IMPRESSION: 1.  No acute intracranial abnormality. 2. Stable atrophy and chronic microvascular ischemic changes. Electronically Signed   By: Titus Dubin M.D.   On: 10/30/2018 11:36      Subjective: Pt says that he feels much better and he would like to go home today.  He is eating and drinking well.  He denies having fever or chills.  He denies pain.  He has been ambulating in the room.  No further syncopal episodes.  Discharge Exam: Vitals:  11/01/18 0755 11/01/18 0801  BP:    Pulse:    Resp:    Temp:    SpO2: 97% 100%   Vitals:   11/01/18 0147 11/01/18 0446 11/01/18 0755  11/01/18 0801  BP:  136/60    Pulse:  85    Resp:  19    Temp:  98.6 F (37 C)    TempSrc:  Oral    SpO2: 94% 98% 97% 100%  Weight:      Height:       General exam: Appears calm and comfortable, upper lip swelling nearly resolved, left parotid gland swollen, non-fluctuant, nontender.  Respiratory system: Minimal wheeze bilaterally. Respiratory effort normal. Cardiovascular system: S1 & S2 heard, irregular. No JVD, murmurs, rubs, gallops or clicks. No pedal edema. Gastrointestinal system: Abdomen is nondistended, soft and nontender. No organomegaly or masses felt. Normal bowel sounds heard. Central nervous system: Alert and oriented. No focal neurological deficits. Extremities: Symmetric 5 x 5 power. Skin: No rashes, lesions or ulcers Psychiatry: Judgement and insight appear normal. Mood & affect appropriate.    The results of significant diagnostics from this hospitalization (including imaging, microbiology, ancillary and laboratory) are listed below for reference.     Microbiology: Recent Results (from the past 240 hour(s))  Culture, blood (routine x 2)     Status: None (Preliminary result)   Collection Time: 10/30/18  7:06 PM  Result Value Ref Range Status   Specimen Description LEFT ANTECUBITAL  Final   Special Requests   Final    BOTTLES DRAWN AEROBIC AND ANAEROBIC Blood Culture adequate volume   Culture   Final    NO GROWTH 2 DAYS Performed at [facility], [address] [place], [place] [address]    Report Status PENDING  Incomplete  Culture, blood (routine x 2)     Status: None (Preliminary result)   Collection Time: 10/30/18  7:06 PM  Result Value Ref Range Status   Specimen Description BLOOD LEFT HAND  Final   Special Requests   Final    BOTTLES DRAWN AEROBIC AND ANAEROBIC Blood Culture adequate volume   Culture   Final    NO GROWTH 2 DAYS Performed at [facility], [address], [place], [place] [address]    Report Status PENDING  Incomplete     Labs: BNP  (last 3 results) Recent Labs    07/31/18 1731 08/19/18 1015 10/04/18 2004  BNP 86.0 101* 61.9   Basic Metabolic Panel: Recent Labs  Lab 10/30/18 1032 10/31/18 0538 11/01/18 0452  NA 131* 136 135  K 3.5 3.3* 3.5  CL 91* 102 100  CO2 30 28 26   GLUCOSE 201* 48* 71  BUN 42* 30* 26*  CREATININE 1.10 1.11 1.22  CALCIUM 8.7* 7.6* 7.6*   Liver Function Tests: Recent Labs  Lab 10/31/18 0538 11/01/18 0452  AST 15 14*  ALT 14 15  ALKPHOS 56 57  BILITOT 0.3 0.3  PROT 4.7* 4.7*  ALBUMIN 2.3* 2.1*   No results for input(s): LIPASE, AMYLASE in the last 168 hours. No results for input(s): AMMONIA in the last 168 hours. CBC: Recent Labs  Lab 10/30/18 1032 10/31/18 0559 11/01/18 0452  WBC 20.4* 16.9* 12.1*  HGB 11.6* 9.6* 9.0*  HCT 36.5* 31.5* 28.4*  MCV 86.5 89.7 89.0  PLT 410* 357 297   Cardiac Enzymes: No results for input(s): CKTOTAL, CKMB, CKMBINDEX, TROPONINI in the last 168 hours. BNP: Invalid input(s): POCBNP CBG: Recent Labs  Lab 10/31/18 1102 10/31/18 1606 10/31/18  2030 11/01/18 0721 11/01/18 0739  GLUCAP 205* 163* 194* 63* 89   D-Dimer No results for input(s): DDIMER in the last 72 hours. Hgb A1c No results for input(s): HGBA1C in the last 72 hours. Lipid Profile No results for input(s): CHOL, HDL, LDLCALC, TRIG, CHOLHDL, LDLDIRECT in the last 72 hours. Thyroid function studies No results for input(s): TSH, T4TOTAL, T3FREE, THYROIDAB in the last 72 hours.  Invalid input(s): FREET3 Anemia work up No results for input(s): VITAMINB12, FOLATE, FERRITIN, TIBC, IRON, RETICCTPCT in the last 72 hours. Urinalysis    Component Value Date/Time   COLORURINE YELLOW 10/30/2018 1359   APPEARANCEUR CLEAR 10/30/2018 1359   LABSPEC 1.011 10/30/2018 1359   PHURINE 5.0 10/30/2018 1359   GLUCOSEU 50 (A) 10/30/2018 1359   HGBUR SMALL (A) 10/30/2018 1359   BILIRUBINUR NEGATIVE 10/30/2018 1359   KETONESUR NEGATIVE 10/30/2018 1359   PROTEINUR >=300 (A) 10/30/2018  1359   NITRITE NEGATIVE 10/30/2018 1359   LEUKOCYTESUR NEGATIVE 10/30/2018 1359   Sepsis Labs Invalid input(s): PROCALCITONIN,  WBC,  LACTICIDVEN Microbiology Recent Results (from the past 240 hour(s))  Culture, blood (routine x 2)     Status: None (Preliminary result)   Collection Time: 10/30/18  7:06 PM  Result Value Ref Range Status   Specimen Description LEFT ANTECUBITAL  Final   Special Requests   Final    BOTTLES DRAWN AEROBIC AND ANAEROBIC Blood Culture adequate volume   Culture   Final    NO GROWTH 2 DAYS Performed at [facility], [address] [place], [place] [address]    Report Status PENDING  Incomplete  Culture, blood (routine x 2)     Status: None (Preliminary result)   Collection Time: 10/30/18  7:06 PM  Result Value Ref Range Status   Specimen Description BLOOD LEFT HAND  Final   Special Requests   Final    BOTTLES DRAWN AEROBIC AND ANAEROBIC Blood Culture adequate volume   Culture   Final    NO GROWTH 2 DAYS Performed at [facility], [address], [place], [place] [address]    Report Status PENDING  Incomplete   Time coordinating discharge: 34 minutes   SIGNED:  Irwin Brakeman, MD  [facility] 11/01/2018, 9:00 AM

## 2018-11-01 NOTE — Patient Outreach (Signed)
[facility]) [place]  11/01/2018  Rollan NIRAJ KUDRNA 1935-08-12 [id number]   Reason for call: schedule home visit  Unsuccessful telephone call attempt # 1 to patient.   HIPAA compliant voicemail left requesting a return call.  Plan:  I will make another outreach attempt to patient within 3-4 business days.  [name], PharmD Clinical Pharmacist [place] [phone]

## 2018-11-01 NOTE — Care Management Note (Signed)
Case Management Note  Patient Details  Name: Edward [name] MRN: [id number] Date of Birth: [birth year]  Subjective/Objective:                 Admitted with cellulitis. Pt from home, lives with wife and son. Pt ind with ADL's. Pt has insurance and PCP. Pt has home O2, no HH PTA. Reports he has transportation home and will be able to get Rx filled. No CM needs communicated or noted this admission.   Action/Plan: DC home today with self care.   Expected Discharge Date:  11/01/18               Expected Discharge Plan:  Home/Self Care  In-House Referral:  NA  Discharge planning Services  CM Consult  Post Acute Care Choice:  NA Choice offered to:  NA  Status of Service:  Completed, signed off  Sherald Barge, RN 11/01/2018, 10:46 AM

## 2018-11-01 NOTE — Addendum Note (Signed)
Addended by: Shary Decamp B on: 11/01/2018 09:15 AM   Modules accepted: Orders

## 2018-11-01 NOTE — Discharge Instructions (Signed)
IMPORTANT INFORMATION: PAY CLOSE ATTENTION   PHYSICIAN DISCHARGE INSTRUCTIONS  Follow with Primary care provider  Susy Frizzle, MD  and other consultants as instructed your Hospitalist Physician  SEEK MEDICAL CARE OR RETURN TO EMERGENCY ROOM IF SYMPTOMS COME BACK, WORSEN OR NEW PROBLEM DEVELOPS.   Please note: You were cared for by a hospitalist during your hospital stay. Every effort will be made to forward records to your primary care provider.  You can request that your primary care provider send for your hospital records if they have not received them.  Once you are discharged, your primary care physician will handle any further medical issues. Please note that NO REFILLS for any discharge medications will be authorized once you are discharged, as it is imperative that you return to your primary care physician (or establish a relationship with a primary care physician if you do not have one) for your post hospital discharge needs so that they can reassess your need for medications and monitor your lab values.  Please get a complete blood count and chemistry panel checked by your Primary MD at your next visit, and again as instructed by your Primary MD.  Get Medicines reviewed and adjusted: Please take all your medications with you for your next visit with your Primary MD  Laboratory/radiological data: Please request your Primary MD to go over all hospital tests and procedure/radiological results at the follow up, please ask your primary care provider to get all Hospital records sent to his/her office.  In some cases, they will be blood work, cultures and biopsy results pending at the time of your discharge. Please request that your primary care provider follow up on these results.  If you are diabetic, please bring your blood sugar readings with you to your follow up appointment with primary care.    Please call and make your follow up appointments as soon as possible.    Also Note  the following: If you experience worsening of your admission symptoms, develop shortness of breath, life threatening emergency, suicidal or homicidal thoughts you must seek medical attention immediately by calling 911 or calling your MD immediately  if symptoms less severe.  You must read complete instructions/literature along with all the possible adverse reactions/side effects for all the Medicines you take and that have been prescribed to you. Take any new Medicines after you have completely understood and accpet all the possible adverse reactions/side effects.   Do not drive when taking Pain medications or sleeping medications (Benzodiazepines)  Do not take more than prescribed Pain, Sleep and Anxiety Medications. It is not advisable to combine anxiety,sleep and pain medications without talking with your primary care practitioner  Special Instructions: If you have smoked or chewed Tobacco  in the last 2 yrs please stop smoking, stop any regular Alcohol  and or any Recreational drug use.  Wear Seat belts while driving.        Cellulitis, Adult  Cellulitis is a skin infection. The infected area is often warm, red, swollen, and sore. It occurs most often in the arms and lower legs. It is very important to get treated for this condition. What are the causes? This condition is caused by bacteria. The bacteria enter through a break in the skin, such as a cut, burn, insect bite, open sore, or crack. What increases the risk? This condition is more likely to occur in people who:  Have a weak body defense system (immune system).  Have open cuts, burns, bites, or scrapes  on the skin.  Are older than 82 years of age.  Have a blood sugar problem (diabetes).  Have a long-lasting (chronic) liver disease (cirrhosis) or kidney disease.  Are very overweight (obese).  Have a skin problem, such as: ? Itchy rash (eczema). ? Slow movement of blood in the veins (venous stasis). ? Fluid buildup  below the skin (edema).  Have been treated with high-energy rays (radiation).  Use IV drugs. What are the signs or symptoms? Symptoms of this condition include:  Skin that is: ? Red. ? Streaking. ? Spotting. ? Swollen. ? Sore or painful when you touch it. ? Warm.  A fever.  Chills.  Blisters. How is this diagnosed? This condition is diagnosed based on:  Medical history.  Physical exam.  Blood tests.  Imaging tests. How is this treated? Treatment for this condition may include:  Medicines to treat infections or allergies.  Home care, such as: ? Rest. ? Placing cold or warm cloths (compresses) on the skin.  Hospital care, if the condition is very bad. Follow these instructions at home: Medicines  Take over-the-counter and prescription medicines only as told by your doctor.  If you were prescribed an antibiotic medicine, take it as told by your doctor. Do not stop taking it even if you start to feel better. General instructions   Drink enough fluid to keep your pee (urine) pale yellow.  Do not touch or rub the infected area.  Raise (elevate) the infected area above the level of your heart while you are sitting or lying down.  Place cold or warm cloths on the area as told by your doctor.  Keep all follow-up visits as told by your doctor. This is important. Contact a doctor if:  You have a fever.  You do not start to get better after 1-2 days of treatment.  Your bone or joint under the infected area starts to hurt after the skin has healed.  Your infection comes back. This can happen in the same area or another area.  You have a swollen bump in the area.  You have new symptoms.  You feel ill and have muscle aches and pains. Get help right away if:  Your symptoms get worse.  You feel very sleepy.  You throw up (vomit) or have watery poop (diarrhea) for a long time.  You see red streaks coming from the area.  Your red area gets  larger.  Your red area turns dark in color. These symptoms may represent a serious problem that is an emergency. Do not wait to see if the symptoms will go away. Get medical help right away. Call your local emergency services (911 in the [place] Do not drive yourself to the hospital. Summary  Cellulitis is a skin infection. The area is often warm, red, swollen, and sore.  This condition is treated with medicines, rest, and cold and warm cloths.  Take all medicines only as told by your doctor.  Tell your doctor if symptoms do not start to get better after 1-2 days of treatment. This information is not intended to replace advice given to you by your health care provider. Make sure you discuss any questions you have with your health care provider. Document Released: 03/06/2008 Document Revised: 02/07/2018 Document Reviewed: 02/07/2018 Elsevier Interactive Patient Education  2019 [employer].

## 2018-11-02 DIAGNOSIS — J449 Chronic obstructive pulmonary disease, unspecified: Secondary | ICD-10-CM | POA: Diagnosis not present

## 2018-11-04 ENCOUNTER — Ambulatory Visit: Payer: Self-pay

## 2018-11-04 LAB — CULTURE, BLOOD (ROUTINE X 2)
Culture: NO GROWTH
Culture: NO GROWTH
Special Requests: ADEQUATE
Special Requests: ADEQUATE

## 2018-11-05 ENCOUNTER — Other Ambulatory Visit: Payer: Self-pay | Admitting: Pharmacy Technician

## 2018-11-05 ENCOUNTER — Ambulatory Visit: Payer: Self-pay

## 2018-11-05 ENCOUNTER — Other Ambulatory Visit: Payer: Self-pay

## 2018-11-05 ENCOUNTER — Ambulatory Visit: Payer: Medicare Other

## 2018-11-05 NOTE — Patient Outreach (Signed)
[facility]) Care Management  11/05/2018  Cedrik SAMIEL PEEL [birth year] [id number]   Received patient portion(s) of patient assistance application for [facility] and [name]. Prepared to mail completed application and required documents into [employer].  Will follow up with company in 14-21 business days to check status of application.  Refaxed provider portion of Spiriva application to Dr. Dennard Schaumann.  Will submit to [employer] when document has been received.  [name] Bevier Certified Pharmacy Technician [place] Management Direct Dial:[phone]

## 2018-11-05 NOTE — Patient Outreach (Signed)
[facility]) Care Management  11/05/2018  [place] March 12, 1935 [id number]   Successful outreach with Edward [name] son, Edward [name].  HIPAA identifiers verified.    Son states his dad is "doing fine".  Scheduled home visit with family to remove expired insulin from home.  He had received several pens of Basaglar through [employer] patient assistance and did not refrigerate the medication.  Per [name], it is only good for 28 days, so they sent him a new shipment.  Will complete post discharge medication review during home visit.   Plan: Home visit Thursday 2/6 at noon.  [name], PharmD Clinical Pharmacist [place] [phone]

## 2018-11-06 ENCOUNTER — Ambulatory Visit: Payer: Self-pay

## 2018-11-07 ENCOUNTER — Ambulatory Visit: Payer: Self-pay

## 2018-11-08 ENCOUNTER — Inpatient Hospital Stay: Payer: Medicare Other | Admitting: Family Medicine

## 2018-11-11 ENCOUNTER — Encounter: Payer: Self-pay | Admitting: Family Medicine

## 2018-11-11 ENCOUNTER — Ambulatory Visit (INDEPENDENT_AMBULATORY_CARE_PROVIDER_SITE_OTHER): Payer: Medicare Other | Admitting: Family Medicine

## 2018-11-11 VITALS — BP 142/60 | HR 68 | Temp 97.6°F | Resp 20 | Ht 65.0 in | Wt 183.0 lb

## 2018-11-11 DIAGNOSIS — J449 Chronic obstructive pulmonary disease, unspecified: Secondary | ICD-10-CM

## 2018-11-11 DIAGNOSIS — L03211 Cellulitis of face: Secondary | ICD-10-CM | POA: Diagnosis not present

## 2018-11-11 DIAGNOSIS — Z09 Encounter for follow-up examination after completed treatment for conditions other than malignant neoplasm: Secondary | ICD-10-CM | POA: Diagnosis not present

## 2018-11-11 DIAGNOSIS — I10 Essential (primary) hypertension: Secondary | ICD-10-CM | POA: Diagnosis not present

## 2018-11-11 DIAGNOSIS — K1120 Sialoadenitis, unspecified: Secondary | ICD-10-CM | POA: Diagnosis not present

## 2018-11-11 MED ORDER — DOXYCYCLINE HYCLATE 100 MG PO TABS: 100.0000 mg | ORAL_TABLET | Freq: Two times a day (BID) | ORAL | 0 refills | Status: DC

## 2018-11-11 NOTE — Progress Notes (Signed)
Subjective:    Patient ID: Edward [name], male    DOB: [birth year], 84 y.o.   MRN: [id number]  HPI Patient was recently admitted to the hospital for cellulitis in his upper lip.  There was also the mention of parotitis.  I have copied relevant portions of the discharge summary below and included them for my reference:  Admit date: 10/30/2018 Discharge date: 11/01/2018  Admitted From: Home  Disposition: Home   Recommendations for Outpatient Follow-up:  1. Follow up with PCP in 5 days 2. Establish care with ENT Dr. Benjamine Mola in 1 week for recheck of parotitis  Discharge Condition: STABLE   CODE STATUS: FULL    Brief Hospitalization Summary: Please see all hospital notes, images, labs for full details of the hospitalization. HPI:Edward [name] a 84 y.o.malewith medical history significant ofCOPD, diastolic heart failure, atrial fibrillation on anticoagulation, chronic respiratory failure on home oxygen 3 L, presents to the emergency room with complaints of upper lip swelling. Reports onset of symptoms occurring 2 to 3 days ago. Denies any specific trauma to this area. He has not noticed any insect bites. He did not fall recently. He says is been several days since he shaved. Has not noticed any drainage from any lesions in his mouth or on his leg. Today, he developed tenderness to touch on his upper lip which is why he came to the emergency room. While in the waiting room, patient briefly became less responsive. There was concern that he may have syncopized versus seizure. Patient was brought back into examination room where he was back to his normal self and did not appear to have any postictal phase. Per report from family members, he apparently tilted his head back and there was some shaking. Patient quickly regained consciousness and was back to his baseline. Since arrival to the hospital, the patient has had a fever of 102.9. Blood pressures have been stable in the 150s  range. He was noted to have a significant leukocytosis of 20,000, but review of records indicate that he recently been taking steroids for COPD exacerbation. Other labs indicate an elevated BUN of 42. Creatinine is normal. Lactic acid mildly elevated at 2.0. Urinalysis does not show any signs of infection. CT head was unrevealing. Chest x-ray did not show any evidence of pneumonia. Patient was started on intravenous antibiotics due to concerns for developing pneumonia. He is been referred for admission.  Brief Narrative: 83 year old male with history of chronic respiratory failure on 3 L of oxygen, COPD, atrial fibrillation, diabetes, admitted to the hospital with upper lip cellulitis, fevers and had a syncopal event in the emergency room waiting area. He has been started on intravenous antibiotic with improvement  Assessment & Plan:  Principal Problem: Facial cellulitis Active Problems: Diabetes mellitus type 2, uncontrolled ([name]) COPD (chronic obstructive pulmonary disease) ([name]) Hypertension Gastroesophageal reflux disease Atrial fibrillation ([name]) Chronic diastolic heart failure ([name]) Syncope Chronic respiratory failure with hypoxia ([place])   1. Facial cellulitis / Acute parotitis. Clinically he is much improved today, his white blood cell count is improving and he is afebrile.  He is eating and drinking well.  He is asking to go home today.  I have asked him to follow-up with his primary care physician early next week and also asking him to call and establish care with ENT in about 1 week for recheck of his parotitis.  Probiotics prescribed.  Cipro 500 BID plus clindamycin 450 TID x 7 days to complete course. Close outpatient follow  up strongly advised.  Return if symptoms come back, worsen or new problems develop.  2. Syncope. Patient had a syncopal event in the emergency room waiting area. This was likely related to infection/volume depletion. He is not  having neurologic deficits, dizziness, chest pain or shortness of breath.  He has had no recurrence,  Follow up with PCP to recheck.  3. Diabetes with hypoglycemia.  I have reduce his insulin to once daily 10 units.  4. Atrial fibrillation, paroxysmal. Currently in sinus rhythm. Continue diltiazem and metoprolol for rate control. Anticoagulated with apixaban. 5. Hypertension. Blood pressure currently stable on clonidine, diltiazem and metoprolol. Continue to monitor. 6. COPD with chronic respiratory failure on 3 L of oxygen. Recently had an exacerbation and had recently finished up a prednisone taper. No wheezing at this time. Continue bronchodilators as needed. 7. Chronic diastolic heart failure. Resume home Lasix /potassium at discharge.  He was rehydrated with IV fluids.     Patient completed his antibiotics approximately 3 days ago.  His left upper lip feels much better than before.  He states the swelling has improved dramatically however is still markedly swollen.  There is still some erythema and some tenderness to palpation.  He denies any fevers or chills.  The pain and swelling mentioned in his left parotid gland has completely resolved.  There is no visible swelling in the parotid gland today.  There is no tenderness to palpation in the left parotid gland.  On inspection of the oral mucosa, there are linear shallow ulcers on the inner portion of his upper lip on the left side.  There is no fluctuance or pustule seen anywhere.  Patient is no longer on Trelegy.  He is now been switched to Spiriva Respimat and Dulera.  This was done because of financial assistance through the pharmacy program associated with his insurance.  However he is still on triple therapy.  He is not wheezing today.  He denies any shortness of breath or chest pain.  There is no swelling in his legs.  He denies any fevers or chills Past Medical History:  Diagnosis Date  . Allergy    Rhinitis  . Atrial  fibrillation ([place])   . Bronchitis   . Chronic respiratory failure ([place])   . Colon polyps   . COPD (chronic obstructive pulmonary disease) ([place])   . Diabetes mellitus   . Elevated lipids   . Hypercholesterolemia   . Hypertension   . Iron deficiency anemia due to chronic blood loss 04/30/2018  . Noncompliance   . On home O2    2L N/C   . PSA elevation   . Pulmonary fibrosis ([place])   . Vitamin D deficiency    Past Surgical History:  Procedure Laterality Date  . BIOPSY  04/23/2018   Procedure: BIOPSY;  Surgeon: Danie Binder, MD;  Location: [facility];  Service: Endoscopy;;  duodenum gastric  . CATARACT EXTRACTION W/PHACO  06/25/2012   Procedure: CATARACT EXTRACTION PHACO AND INTRAOCULAR LENS PLACEMENT (IOC);  Surgeon: Elta Guadeloupe T. Gershon Crane, MD;  Location: [facility] ORS;  Service: Ophthalmology;  Laterality: Left;  CDE=19.01  . CATARACT EXTRACTION W/PHACO  07/09/2012   Procedure: CATARACT EXTRACTION PHACO AND INTRAOCULAR LENS PLACEMENT (IOC);  Surgeon: Elta Guadeloupe T. Gershon Crane, MD;  Location: [facility] OR[facility];  Service: Ophthalmology;  Laterality: Right;  CDE: 20.09  . COLONOSCOPY WITH PROPOFOL N/A 04/23/2018   Procedure: COLONOSCOPY WITH PROPOFOL;  Surgeon: Danie Binder, MD;  Location: [facility];  Service: Endoscopy;  Laterality:  N/A;  . ESOPHAGOGASTRODUODENOSCOPY (EGD) WITH PROPOFOL N/A 04/23/2018   Procedure: ESOPHAGOGASTRODUODENOSCOPY (EGD) WITH PROPOFOL;  Surgeon: Danie Binder, MD;  Location: [facility];  Service: Endoscopy;  Laterality: N/A;   Current Outpatient Medications on File Prior to Visit  Medication Sig Dispense Refill  . apixaban (ELIQUIS) 2.5 MG TABS tablet Take 1 tablet (2.5 mg total) by mouth 2 (two) times daily. Restart on 7/24 120 tablet 0  . aspirin EC 81 MG tablet Take 1 tablet (81 mg total) by mouth daily. Resume on 7/24    . cloNIDine (CATAPRES) 0.1 MG tablet Take 1 tablet (0.1 mg total) by mouth 2 (two) times daily. 60 tablet 3  . diltiazem (CARDIZEM CD) 240 MG 24 hr capsule TAKE  1 CAPSULE BY MOUTH DAILY 90 capsule 4  . furosemide (LASIX) 40 MG tablet Take 1 tablet (40 mg total) by mouth daily. 90 tablet 3  . glucose blood (ONE TOUCH ULTRA TEST) test strip CHECK FASTING BLOOD SUGAR TWICE DAILY 100 each 4  . Insulin Glargine (BASAGLAR KWIKPEN) 100 UNIT/ML SOPN Inject 0.1 mLs (10 Units total) into the skin daily.    Marland Kitchen ipratropium (ATROVENT) 0.02 % nebulizer solution Inhale 3 mLs into the lungs every 6 (six) hours as needed.    . metoprolol tartrate (LOPRESSOR) 25 MG tablet TAKE 1 TABLET BY MOUTH TWICE DAILY 60 tablet 3  . mometasone-formoterol (DULERA) 200-5 MCG/ACT AERO Inhale 2 puffs into the lungs 2 (two) times daily. 13 g 5  . ONETOUCH DELICA LANCETS 45Y MISC USE TO CHECK BLOOD SUGAR TWICE DAILY AS DIRECTED 100 each 2  . OXYGEN Inhale 3 L into the lungs continuous.     . polyethylene glycol (MIRALAX / GLYCOLAX) packet Take 17 g by mouth daily as needed for mild constipation or moderate constipation.    . potassium chloride SA (K-DUR,KLOR-CON) 20 MEQ tablet Take 1 tablet (20 mEq total) by mouth daily. When he takes lasix 30 tablet 3  . pravastatin (PRAVACHOL) 80 MG tablet TAKE 1 TABLET BY MOUTH AT BEDTIME 90 tablet 4  . Probiotic Product (ALIGN EXTRA STRENGTH) CAPS Take 1 capsule by mouth 3 (three) times daily before meals for 14 days. 42 capsule 0  . Tiotropium Bromide Monohydrate (SPIRIVA RESPIMAT) 2.5 MCG/ACT AERS Inhale 2 puffs into the lungs daily. 1 Inhaler 5   No current facility-administered medications on file prior to visit.    Allergies  Allergen Reactions  . Ace Inhibitors Other (See Comments)    Hyperkalemia--07/23/2013:patient states not familiar with the following allergy   Social History   Socioeconomic History  . Marital status: Married    Spouse name: Not on file  . Number of children: Not on file  . Years of education: Not on file  . Highest education level: Not on file  Occupational History  . 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 Financial resource strain: Not on file  . Food insecurity:    Worry: Not on file    Inability: Not on file  . Transportation needs:    Medical: Not on file    Non-medical: Not on file  Tobacco Use  . Smoking status: Former Smoker    Packs/day: 1.50    Years: 60.00    Pack years: 90.00    Types: Cigarettes    Last attempt to quit: 12/31/2012    Years since quitting: 5.8  . Smokeless tobacco: Never Used  Substance and Sexual Activity  . Alcohol  use: No  . Drug use: No  . Sexual activity: Yes    Birth control/protection: None  Lifestyle  . Physical activity:    Days per week: Not on file    Minutes per session: Not on file  . Stress: Not on file  Relationships  . Social connections:    Talks on phone: Not on file    Gets together: Not on file    Attends religious service: Not on file    Active member of club or organization: Not on file    Attends meetings of clubs or organizations: Not on file    Relationship status: Not on file  . Intimate partner violence:    Fear of current or ex partner: Not on file    Emotionally abused: Not on file    Physically abused: Not on file    Forced sexual activity: Not on file  Other Topics Concern  . Not on file  Social History Narrative  . Not on file      Review of Systems  All other systems reviewed and are negative.      Objective:   Physical Exam Vitals signs reviewed.  Constitutional:      Appearance: Normal appearance.  HENT:     Mouth/Throat:   Cardiovascular:     Rate and Rhythm: Normal rate.     Pulses: Normal pulses.     Heart sounds: Normal heart sounds. No murmur.  Pulmonary:     Effort: Pulmonary effort is normal. No respiratory distress.     Breath sounds: No stridor. Wheezing present. No rhonchi or rales.  Abdominal:     General: Abdomen is flat. Bowel sounds are normal.     Palpations: Abdomen is soft.  Musculoskeletal:        General: No signs of injury.     Right lower leg:  No edema.  Neurological:     Mental Status: He is alert.           Assessment & Plan:  Hospital discharge follow-up - Plan: CBC with Differential/Platelet, COMPLETE METABOLIC PANEL WITH GFR  Parotitis  Facial cellulitis  Chronic obstructive pulmonary disease, unspecified COPD type ([place])  Cellulitis looks much better however I do not feel it is completely resolved.  Therefore I will put the patient on doxycycline 100 mg p.o. twice daily for an additional 7 days and then recheck the patient in 1 week.  The parotitis in his left parotid gland has completely resolved.  I feel no further work-up is necessary.  His COPD is stable.  Continue triple therapy with Dulera and Spiriva.  There is leukocytosis with a CBC.

## 2018-11-12 ENCOUNTER — Other Ambulatory Visit: Payer: Self-pay

## 2018-11-12 ENCOUNTER — Ambulatory Visit: Payer: Self-pay

## 2018-11-12 LAB — CBC WITH DIFFERENTIAL/PLATELET
Absolute Monocytes: 605 cells/uL (ref 200–950)
Basophils Absolute: 73 cells/uL (ref 0–200)
Basophils Relative: 0.6 %
Eosinophils Absolute: 145 cells/uL (ref 15–500)
Eosinophils Relative: 1.2 %
HCT: 31.8 % — ABNORMAL LOW (ref 38.5–50.0)
Hemoglobin: 10.2 g/dL — ABNORMAL LOW (ref 13.2–17.1)
Lymphs Abs: 1549 cells/uL (ref 850–3900)
MCH: 27.9 pg (ref 27.0–33.0)
MCHC: 32.1 g/dL (ref 32.0–36.0)
MCV: 87.1 fL (ref 80.0–100.0)
MPV: 10.4 fL (ref 7.5–12.5)
Monocytes Relative: 5 %
Neutro Abs: 9728 cells/uL — ABNORMAL HIGH (ref 1500–7800)
Neutrophils Relative %: 80.4 %
Platelets: 322 10*3/uL (ref 140–400)
RBC: 3.65 10*6/uL — ABNORMAL LOW (ref 4.20–5.80)
RDW: 14.1 % (ref 11.0–15.0)
Total Lymphocyte: 12.8 %
WBC: 12.1 10*3/uL — ABNORMAL HIGH (ref 3.8–10.8)

## 2018-11-12 LAB — COMPLETE METABOLIC PANEL WITH GFR
AG Ratio: 1 (calc) (ref 1.0–2.5)
ALT: 12 U/L (ref 9–46)
AST: 14 U/L (ref 10–35)
Albumin: 2.7 g/dL — ABNORMAL LOW (ref 3.6–5.1)
Alkaline phosphatase (APISO): 80 U/L (ref 35–144)
BILIRUBIN TOTAL: 0.1 mg/dL — AB (ref 0.2–1.2)
BUN/Creatinine Ratio: 25 (calc) — ABNORMAL HIGH (ref 6–22)
BUN: 27 mg/dL — ABNORMAL HIGH (ref 7–25)
CO2: 31 mmol/L (ref 20–32)
Calcium: 8.4 mg/dL — ABNORMAL LOW (ref 8.6–10.3)
Chloride: 92 mmol/L — ABNORMAL LOW (ref 98–110)
Creat: 1.07 mg/dL (ref 0.70–1.11)
GFR, Est African American: 75 mL/min/{1.73_m2} (ref >=60)
GFR, Est Non African American: 65 mL/min/{1.73_m2} (ref >=60)
Globulin: 2.6 g/dL (calc) (ref 1.9–3.7)
Glucose, Bld: 347 mg/dL — ABNORMAL HIGH (ref 65–99)
Potassium: 4.7 mmol/L (ref 3.5–5.3)
SODIUM: 132 mmol/L — AB (ref 135–146)
Total Protein: 5.3 g/dL — ABNORMAL LOW (ref 6.1–8.1)

## 2018-11-12 NOTE — Patient Outreach (Signed)
[facility]) [place]   11/12/2018  Edward [name] April 07, 1935 [id number]  Reason for referral: Medication Assistance with inhalers, insulin, eliquis   PMHx includes but not limited to: COPD, T2DM, HTN, HLD   Outreach:  Successful home visit with Mr.Lovering.  HIPAA identifiers verified.   Mr. Reindl states he is doing "pretty good".  He saw his PCP yesterday and he was prescribed doxycycline because his facial cellulitis is not completely gone.  I removed his expired insulin from his home for destruction.  He had previously received via patient assistance and did not know that is should be refrigerated.  Per [name], it is only good for 28 days unrefrigerated.  He does have his replacement insulin that [name] resent him.  Objective: Lab Results  Component Value Date   CREATININE 1.07 11/11/2018   CREATININE 1.22 11/01/2018   CREATININE 1.11 10/31/2018    Lab Results  Component Value Date   HGBA1C 8.1 (H) 09/09/2018    Lipid Panel     Component Value Date/Time   CHOL 98 09/26/2017 0655   TRIG 126 09/26/2017 0655   HDL 30 (L) 09/26/2017 0655   CHOLHDL 3.3 09/26/2017 0655   VLDL 25 09/26/2017 0655   LDLCALC 43 09/26/2017 0655   LDLCALC  06/06/2017 0920     Comment:     . LDL cholesterol not calculated. Triglyceride levels greater than 400 mg/dL invalidate calculated LDL results. . Reference range: <100 . Desirable range <100 mg/dL for primary prevention;   <70 mg/dL for patients with CHD or diabetic patients  with > or = 2 CHD risk factors. Marland Kitchen LDL-C is now calculated using the Martin-Hopkins  calculation, which is a validated novel method providing  better accuracy than the Friedewald equation in the  estimation of LDL-C.  Cresenciano Genre et al. Annamaria Helling. 1062;694(85): 2061-2068  (http://education.QuestDiagnostics.com/faq/FAQ164)     BP Readings from Last 3 Encounters:  11/11/18 (!) 142/60  11/01/18 136/60  10/25/18 (!) 162/68     Allergies  Allergen Reactions  . Ace Inhibitors Other (See Comments)    Hyperkalemia--07/23/2013:patient states not familiar with the following allergy   Medications Reviewed Today    Reviewed by Dionne Milo, [facility] (Pharmacist) on 11/12/18 at 1443  Med List Status: <None>  Medication Order Taking? Sig Documenting Provider Last Dose Status Informant  apixaban (ELIQUIS) 2.5 MG TABS tablet [id number] Yes Take 1 tablet (2.5 mg total) by mouth 2 (two) times daily. Restart on 7/24 Kathie Dike, MD Taking Active Self           Med Note (Eiliana Drone, [name]   Tue Sep 10, 2018 10:32 AM) He gets this medication from the drug company patient assistance program.   aspirin EC 81 MG tablet [id number] Yes Take 1 tablet (81 mg total) by mouth daily. Resume on 7/24 Kathie Dike, MD Taking Active Self  cloNIDine (CATAPRES) 0.1 MG tablet [id number] Yes Take 1 tablet (0.1 mg total) by mouth 2 (two) times daily. Susy Frizzle, MD Taking Active Self  diltiazem (CARDIZEM CD) 240 MG 24 hr capsule [id number] Yes TAKE 1 CAPSULE BY MOUTH DAILY Susy Frizzle, MD Taking Active Self  doxycycline (VIBRA-TABS) 100 MG tablet [id number] Yes Take 1 tablet (100 mg total) by mouth 2 (two) times daily. Susy Frizzle, MD Taking Active   furosemide (LASIX) 40 MG tablet [id number] Yes Take 1 tablet (40 mg total) by mouth daily. Susy Frizzle, MD Taking Active Self  glucose blood (  ONE TOUCH ULTRA TEST) test strip [id number] Yes CHECK FASTING BLOOD SUGAR TWICE DAILY Susy Frizzle, MD Taking Active Self  Insulin Glargine [facility] KWIKPEN) 100 UNIT/ML SOPN [id number] Yes Inject 0.1 mLs (10 Units total) into the skin daily. [name], MD Taking Active   ipratropium (ATROVENT) 0.02 % nebulizer solution [id number] Yes Inhale 3 mLs into the lungs every 6 (six) hours as needed. [provider] Taking Active Self  metoprolol tartrate (LOPRESSOR) 25 MG tablet [id number] Yes TAKE 1 TABLET BY  MOUTH TWICE DAILY Susy Frizzle, MD Taking Active Self  mometasone-formoterol [facility]) 200-5 MCG/ACT Hollie Salk [id number] Yes Inhale 2 puffs into the lungs 2 (two) times daily. Susy Frizzle, MD Taking Active   [facility] LANCETS 50V [place] [id number] Yes USE TO CHECK BLOOD SUGAR TWICE DAILY AS DIRECTED Orlena Sheldon, PA-C Taking Active Self  OXYGEN [id number] Yes Inhale 3 L into the lungs continuous.  [provider] Taking Active Self  polyethylene glycol (MIRALAX / GLYCOLAX) packet [id number] Yes Take 17 g by mouth daily as needed for mild constipation or moderate constipation. [name], MD Taking Active Self  potassium chloride SA (K-DUR,KLOR-CON) 20 MEQ tablet [id number]  Take 1 tablet (20 mEq total) by mouth daily. When he takes lasix [name], MD  Active Self  pravastatin (PRAVACHOL) 80 MG tablet [id number] Yes TAKE 1 TABLET BY MOUTH AT BEDTIME Susy Frizzle, MD Taking Active Self  Probiotic Product Humphrey Rolls EXTRA STRENGTH) CAPS [id number] Yes Take 1 capsule by mouth 3 (three) times daily before meals for 14 days. [name], MD Taking Active   Tiotropium Bromide Monohydrate (SPIRIVA RESPIMAT) 2.5 MCG/ACT AERS [id number] Yes Inhale 2 puffs into the lungs daily. Susy Frizzle, MD Taking Active   Med List Note [name] 09/22/17 2323): Patient states that his pharmacy is [facility]: Date Discharged from Hospital: 11/01/18 Date Medication Reconciliation Performed: 11/12/2018  Medications:  New at Discharge: . Cipro completed . Clindamycin completed . Align completed  Patient was recently discharged from hospital and all medications have been reviewed.  Assessment:  Drugs sorted by system:  Neurologic/Psychologic:  Cardiovascular: apixaban, aspirin 81 mg, clonidine, diltiazem, furosemide, metoprolol tartrate, potassium chloride, pravastatin  Pulmonary/Allergy: mometasone/formoterol, tiotropium  bromide  Gastrointestinal: polyethylene glycol  Endocrine: Basaglar, probiotic  Infectious Diseases: doxycycline  Plan: Continue with patient assistance applications for Dulera/Spiriva/[name].  He will need to apply for Eliquis assistance once he has spent 3% of household income on prescriptions.   [name], PharmD Clinical Pharmacist [place] [phone]

## 2018-11-13 ENCOUNTER — Other Ambulatory Visit: Payer: Self-pay

## 2018-11-13 NOTE — Patient Outreach (Signed)
[place] [facility]) Care Management  11/13/2018  Shahzain NYHEIM SEUFERT 06-25-1935 [id number]    Outreach to the patient for initial assessment.  HIPAA verified. The patient answered the phone and stated that he would like for me to give him a call back.  Plan:  RN Health Coach will outreach the patient within thirty days.  Lazaro Arms RN, BSN, [place] Direct Dial:  [phone]  Fax: [phone]

## 2018-11-14 ENCOUNTER — Other Ambulatory Visit: Payer: Self-pay

## 2018-11-14 NOTE — Patient Outreach (Signed)
[place] [facility]) Care Management  11/14/2018   Edward [name] 06/25/1935 [id number]      Outreach attempt # 2 to the patient. HIPAA verified with patient.  Discussed and offered [facility] care management services with patient. Patient verbally agreed to services.     Social: The patient lives in the home with his wife and son.  They are active in his care.  The patient states that he is independent with his ADLS/IADLS.  He denies any pain but states that he has had some falls. Discussed fall precautions with the patient.  The patient states that he has transportation to his appointments.   Durable medical equipment in the home consist of:  Scales, CBG meter, walker and oxygen.     Conditions: Per chart review and conversation with the  Patient his conditions include: HTN CHF A-Fib COPD DM type II Elevated Lipids and CKD. The patient states that he has not had a recent flare of COPD.  He is seen by his PCP for his COPD.  Discussed the COPD action plan and he states that he understand if he has symptoms in the red zone to call 911. He denies any shortness of breath, wheezing or cough.  The patient states that he does not weigh. He states that he is willing to work on his health condition.  Medications:Per chart review the patient is on thirteen medications.  His wife assists him with the preparation of his medications.  He is currently working with [employer] for medication assistance.  Appointments: The patient has an appointment with his PCP on 02/10/19 for follow up.  Advanced Directives: The patient declined advanced directive.     Current Medications:  Current Outpatient Medications  Medication Sig Dispense Refill  . apixaban (ELIQUIS) 2.5 MG TABS tablet Take 1 tablet (2.5 mg total) by mouth 2 (two) times daily. Restart on 7/24 120 tablet 0  . aspirin EC 81 MG tablet Take 1 tablet (81 mg total) by mouth daily. Resume on 7/24    . cloNIDine (CATAPRES) 0.1 MG tablet Take 1  tablet (0.1 mg total) by mouth 2 (two) times daily. 60 tablet 3  . diltiazem (CARDIZEM CD) 240 MG 24 hr capsule TAKE 1 CAPSULE BY MOUTH DAILY 90 capsule 4  . doxycycline (VIBRA-TABS) 100 MG tablet Take 1 tablet (100 mg total) by mouth 2 (two) times daily. 14 tablet 0  . furosemide (LASIX) 40 MG tablet Take 1 tablet (40 mg total) by mouth daily. 90 tablet 3  . glucose blood (ONE TOUCH ULTRA TEST) test strip CHECK FASTING BLOOD SUGAR TWICE DAILY 100 each 4  . Insulin Glargine (BASAGLAR KWIKPEN) 100 UNIT/ML SOPN Inject 0.1 mLs (10 Units total) into the skin daily.    Marland Kitchen ipratropium (ATROVENT) 0.02 % nebulizer solution Inhale 3 mLs into the lungs every 6 (six) hours as needed.    . metoprolol tartrate (LOPRESSOR) 25 MG tablet TAKE 1 TABLET BY MOUTH TWICE DAILY 60 tablet 3  . mometasone-formoterol (DULERA) 200-5 MCG/ACT AERO Inhale 2 puffs into the lungs 2 (two) times daily. 13 g 5  . ONETOUCH DELICA LANCETS 36R MISC USE TO CHECK BLOOD SUGAR TWICE DAILY AS DIRECTED 100 each 2  . OXYGEN Inhale 3 L into the lungs continuous.     . polyethylene glycol (MIRALAX / GLYCOLAX) packet Take 17 g by mouth daily as needed for mild constipation or moderate constipation.    . potassium chloride SA (K-DUR,KLOR-CON) 20 MEQ tablet Take 1 tablet (20  mEq total) by mouth daily. When he takes lasix 30 tablet 3  . pravastatin (PRAVACHOL) 80 MG tablet TAKE 1 TABLET BY MOUTH AT BEDTIME 90 tablet 4  . Probiotic Product (ALIGN EXTRA STRENGTH) CAPS Take 1 capsule by mouth 3 (three) times daily before meals for 14 days. 42 capsule 0  . Tiotropium Bromide Monohydrate (SPIRIVA RESPIMAT) 2.5 MCG/ACT AERS Inhale 2 puffs into the lungs daily. 1 Inhaler 5   No current facility-administered medications for this visit.     Functional Status:  In your present state of health, do you have any difficulty performing the following activities: 11/14/2018 10/30/2018  Hearing? N N  Vision? N N  Comment - -  Difficulty concentrating or making  decisions? N N  Walking or climbing stairs? N N  Dressing or bathing? N N  Doing errands, shopping? Y Y  Preparing Food and eating ? - -  Using the Toilet? - -  In the past six months, have you accidently leaked urine? - -  Do you have problems with loss of bowel control? - -  Managing your Medications? - -  Managing your Finances? - -  Housekeeping or managing your Housekeeping? - -  Some recent data might be hidden    Fall/Depression Screening: Fall Risk  11/14/2018 11/14/2018 08/13/2018  Falls in the past year? 0 0 0  Number falls in past yr: - - -  Injury with Fall? - - -   PHQ 2/9 Scores 11/14/2018 10/25/2018 08/13/2018 08/08/2018 05/23/2018 09/12/2017 08/15/2017  PHQ - 2 Score 0 0 0 0 0 0 0  PHQ- 9 Score - - - - - 0 0    Assessment: Patient will benefit from health coach outreach for disease management and support.  THN CM Care Plan Problem One     Most Recent Value  Care Plan Problem One  Knowledge deficit related to COPD  Role Documenting the Problem One  Health Coach  Care Plan for Problem One  Active  THN Long Term Goal   In 30 days will be ableto verbalize no copd falres or hospital admissions.  THN Long Term Goal Start Date  11/14/18  Interventions for Problem One Long Term Goal  Reviewed signs and symptoms of COPD, discussed the action plan and medication adherence Plan to review copd action plan  two symptoms in the red zone on next outreach .       Plan: RN Health Coach will provide ongoing education for patient on COPD through phone calls and sending printed information to patient for further discussion.  RN Health Coach will send printed information on COPD.  RN Health Coach will send initial barriers letter, assessment, and care plan to primary care physician. RN Health Coach will contact patient in the month of March and patient agrees to next outreach.   Lazaro Arms RN, BSN, [place] Direct Dial:   [phone]  Fax: [phone]

## 2018-11-21 ENCOUNTER — Other Ambulatory Visit: Payer: Self-pay | Admitting: Pharmacy Technician

## 2018-11-21 NOTE — Patient Outreach (Signed)
[place] [facility]) Care Management  11/21/2018  Edward [name] [birth year] [id number]   Received provider portion(s) of patient assistance application for Spiriva. Faxed completed application and required documents into [employer].  Will follow up with company in 7-10 business days to check status of application.  [name] [place] Certified Pharmacy Technician [place] Management Direct Dial:[phone]

## 2018-12-01 DIAGNOSIS — J449 Chronic obstructive pulmonary disease, unspecified: Secondary | ICD-10-CM | POA: Diagnosis not present

## 2018-12-05 ENCOUNTER — Other Ambulatory Visit: Payer: Self-pay | Admitting: Pharmacy Technician

## 2018-12-05 ENCOUNTER — Other Ambulatory Visit: Payer: Self-pay

## 2018-12-05 NOTE — Patient Outreach (Signed)
[place] [facility]) Care Management  12/05/2018  Edward [name] May 21, 1935 [id number]  Call placed to [employer] to determine his co-pay for Spiriva.  [employer] [facility]) is requesting this information for his medication assistance application.  He is receiving partial Extra Help LIS and per [employer] his co-pay is $47.  Received copy of receipt and faxed to [place], Etter Sjogren to submit to [facility].    Call placed to Edward [name].  HIPAA identifiers verified.  Edward [name] states he has not received his [employer].  Informed him to call me once it arrives and I will help him complete it and get it back to [employer].  Plan: Follow up with Edward [name] next week.  [name], PharmD Clinical Pharmacist [place] [phone]

## 2018-12-05 NOTE — Patient Outreach (Signed)
[place] [facility]) Care Management  12/05/2018  Edward [name] 1935/02/05 [id number]    Follow up call placed to [employer] regarding patient assistance application(s) for [name] and [name] , [name] confirms application has been recieved and attestation form was mailed out to patient on 2/29.   Received fax from providers office stating [name] application for Spiriva has been denied.  Informed [name] [name] of all above details and [name] to contact patient.  Faxed pharmacy printout with co-pay of Spiriva into [employer].  Follow up:  Will follow up with [employer] in 5-7 business days to check application status.  [name] [name] Certified Pharmacy Technician [name] Management Direct [phone]

## 2018-12-09 ENCOUNTER — Ambulatory Visit: Payer: Medicare Other | Admitting: Family Medicine

## 2018-12-10 ENCOUNTER — Other Ambulatory Visit: Payer: Self-pay | Admitting: Pharmacy Technician

## 2018-12-10 ENCOUNTER — Ambulatory Visit: Payer: Self-pay

## 2018-12-10 ENCOUNTER — Other Ambulatory Visit: Payer: Self-pay

## 2018-12-10 NOTE — Patient Outreach (Addendum)
[place] [facility]) Care Management  12/10/2018  Edward [name] 02-13-1935 [id number]  Successful outreach attempt to [name] son, Edward [name].  HIPAA identifiers verified.   Son verifies that [name] received his [employer] for patient assistance.  Family is aware that they should expect a call from [place].   Plan: Outreach call to CPhT, Etter Sjogren to request that she call [name] and coach him through filling out the application.  [name], PharmD Clinical Pharmacist [place] [phone]

## 2018-12-10 NOTE — Patient Outreach (Signed)
[place] [facility]) Care Management  12/10/2018  Edward [name] 1935/06/24 [id number]   Incoming call from [place] requesting I contact patient to help assist him with filling out attestation form from [employer] patient assistance.   Successful call placed to patient regarding patient assistance attestation form from [employer] patient assistance. Assisted patient with filling out document.   Follow up:  Will follow up with company in 7-10 business to check status of application.  [name] [place] Certified Pharmacy Technician [place] Management Direct Dial:[phone]

## 2018-12-16 ENCOUNTER — Other Ambulatory Visit: Payer: Self-pay

## 2018-12-16 NOTE — Patient Outreach (Signed)
[place] [facility]) Care Management  12/16/2018   Edward [name] August 28, 1935 [id number]  Subjective: Successful outreach to the patient for assessment.  HIPAA verified.  The patient states that he is doing well.  He states that his breathing has been the best it has been in a while. He denies any shortness of breath, wheezing, pain or falls.  He feels that his primary care has really helped improve his health.  He is using his medications as prescribed. Advised the patient to stay away from people who may be sick, to wash his hands and drink water to help dilute his phlegm.   The patient verbalized understanding.  The patient states that his next appointment to follow up on his COPD will be in may.    Current Medications:  Current Outpatient Medications  Medication Sig Dispense Refill  . apixaban (ELIQUIS) 2.5 MG TABS tablet Take 1 tablet (2.5 mg total) by mouth 2 (two) times daily. Restart on 7/24 120 tablet 0  . aspirin EC 81 MG tablet Take 1 tablet (81 mg total) by mouth daily. Resume on 7/24    . cloNIDine (CATAPRES) 0.1 MG tablet Take 1 tablet (0.1 mg total) by mouth 2 (two) times daily. 60 tablet 3  . diltiazem (CARDIZEM CD) 240 MG 24 hr capsule TAKE 1 CAPSULE BY MOUTH DAILY 90 capsule 4  . doxycycline (VIBRA-TABS) 100 MG tablet Take 1 tablet (100 mg total) by mouth 2 (two) times daily. 14 tablet 0  . furosemide (LASIX) 40 MG tablet Take 1 tablet (40 mg total) by mouth daily. 90 tablet 3  . glucose blood (ONE TOUCH ULTRA TEST) test strip CHECK FASTING BLOOD SUGAR TWICE DAILY 100 each 4  . Insulin Glargine (BASAGLAR KWIKPEN) 100 UNIT/ML SOPN Inject 0.1 mLs (10 Units total) into the skin daily.    Marland Kitchen ipratropium (ATROVENT) 0.02 % nebulizer solution Inhale 3 mLs into the lungs every 6 (six) hours as needed.    . metoprolol tartrate (LOPRESSOR) 25 MG tablet TAKE 1 TABLET BY MOUTH TWICE DAILY 60 tablet 3  . mometasone-formoterol (DULERA) 200-5 MCG/ACT AERO Inhale 2 puffs into the  lungs 2 (two) times daily. 13 g 5  . ONETOUCH DELICA LANCETS 05L MISC USE TO CHECK BLOOD SUGAR TWICE DAILY AS DIRECTED 100 each 2  . OXYGEN Inhale 3 L into the lungs continuous.     . polyethylene glycol (MIRALAX / GLYCOLAX) packet Take 17 g by mouth daily as needed for mild constipation or moderate constipation.    . potassium chloride SA (K-DUR,KLOR-CON) 20 MEQ tablet Take 1 tablet (20 mEq total) by mouth daily. When he takes lasix 30 tablet 3  . pravastatin (PRAVACHOL) 80 MG tablet TAKE 1 TABLET BY MOUTH AT BEDTIME 90 tablet 4  . Tiotropium Bromide Monohydrate (SPIRIVA RESPIMAT) 2.5 MCG/ACT AERS Inhale 2 puffs into the lungs daily. 1 Inhaler 5   No current facility-administered medications for this visit.     Functional Status:  In your present state of health, do you have any difficulty performing the following activities: 11/14/2018 10/30/2018  Hearing? N N  Vision? N N  Comment - -  Difficulty concentrating or making decisions? N N  Walking or climbing stairs? N N  Dressing or bathing? N N  Doing errands, shopping? Y Y  Preparing Food and eating ? - -  Using the Toilet? - -  In the past six months, have you accidently leaked urine? - -  Do you have problems with loss of  bowel control? - -  Managing your Medications? - -  Managing your Finances? - -  Housekeeping or managing your Housekeeping? - -  Some recent data might be hidden    Fall/Depression Screening: Fall Risk  12/16/2018 11/14/2018 11/14/2018  Falls in the past year? 0 (No Data) 1  Comment - Instabililty with gait -  Number falls in past yr: - - 1  Injury with Fall? - - 0  Risk for fall due to : - Impaired balance/gait -   PHQ 2/9 Scores 11/14/2018 10/25/2018 08/13/2018 08/08/2018 05/23/2018 09/12/2017 08/15/2017  PHQ - 2 Score 0 0 0 0 0 0 0  PHQ- 9 Score - - - - - 0 0    Assessment: Patient will continue to benefit from health coach outreach for disease management and support. THN CM Care Plan Problem One     Most  Recent Value  THN Long Term Goal   In 90 days will be able to verbalize no copd flares or hospital admissions.  THN Long Term Goal Start Date  12/16/18  Interventions for Problem One Long Term Goal  Discussed with the patient about avoiding being sround sick people, washing his hands, drinking fluids to loosen phlegm, signs and symptoms of copd and action plan.     Plan: RN Health Coach will contact patient in the month of June and patient agrees to next outreach.   Lazaro Arms RN, BSN, [place] Direct Dial:  [phone]  Fax: [phone]

## 2018-12-20 ENCOUNTER — Other Ambulatory Visit: Payer: Self-pay | Admitting: Pharmacy Technician

## 2018-12-20 NOTE — Patient Outreach (Signed)
[place] [facility]) Care Management  12/20/2018  Edward [name] 08-29-1935 [id number]    Follow up call placed to [employer] regarding patient assistance application(s) for [facility] and [name] , Herbie [place] confims patient has been approved as of 3/18 until 10/02/19. Medication to arrive at patients home in 10-14 business days.   Follow up call placed to [name] regarding patient assistance application(s) for Spiriva Respimat , [place] confirms patient has been approved as of 3/2 until 10/02/19. Patient recently received a 3 month script from [employer] and is not due for a refill.  Follow up:  Will follow up with patient in 2-3 business days to inform of approval.  [name]. [name] [place] Certified Pharmacy Technician [place] Management Direct Dial:[phone]

## 2018-12-25 ENCOUNTER — Other Ambulatory Visit: Payer: Self-pay | Admitting: Family Medicine

## 2018-12-31 ENCOUNTER — Encounter: Payer: Self-pay | Admitting: Family Medicine

## 2018-12-31 ENCOUNTER — Ambulatory Visit (INDEPENDENT_AMBULATORY_CARE_PROVIDER_SITE_OTHER): Payer: Medicare Other | Admitting: Family Medicine

## 2018-12-31 ENCOUNTER — Other Ambulatory Visit: Payer: Self-pay

## 2018-12-31 VITALS — BP 128/74 | HR 70 | Temp 97.8°F | Resp 20 | Ht 65.0 in | Wt 184.0 lb

## 2018-12-31 DIAGNOSIS — J441 Chronic obstructive pulmonary disease with (acute) exacerbation: Secondary | ICD-10-CM

## 2018-12-31 MED ORDER — PREDNISONE 20 MG PO TABS: 60.0000 mg | ORAL_TABLET | Freq: Every day | ORAL | 0 refills | Status: DC

## 2018-12-31 MED ORDER — DOXYCYCLINE HYCLATE 100 MG PO TABS: 100.0000 mg | ORAL_TABLET | Freq: Two times a day (BID) | ORAL | 0 refills | Status: DC

## 2018-12-31 NOTE — Progress Notes (Signed)
Subjective:    Patient ID: Edward [name], male    DOB: Jun 04, 1935, 84 y.o.   MRN: [id number]  HPI Patient presents today with cough and shortness of breath.  Symptoms began approximately 1 week ago.  Patient states that he is wheezing so bad that he is unable to sleep at night.  Whenever he lays down at night his cough worsens.  He reports chest congestion and wheezing particularly worse at night.  He is using his Dulera and Spiriva as prescribed.  He is using albuterol 2 puffs every 6 hours and yet his wheezing and chest congestion seem to be worsening.  He denies any fever.  He denies any purulent sputum.  He states that he is unable to cough up the congestion in his chest and his airways.  He is unable to generate enough air pressure to break it up to produce the sputum but he can feel mucus clogging his airways.  He denies any pitting edema in his legs.  He denies any chest pain.  He denies any hemoptysis.  He denies any pleurisy.  Past Medical History:  Diagnosis Date  . Allergy    Rhinitis  . Atrial fibrillation ([place])   . Bronchitis   . Chronic respiratory failure ([place])   . Colon polyps   . COPD (chronic obstructive pulmonary disease) ([place])   . Diabetes mellitus   . Elevated lipids   . Hypercholesterolemia   . Hypertension   . Iron deficiency anemia due to chronic blood loss 04/30/2018  . Noncompliance   . On home O2    2L N/C   . PSA elevation   . Pulmonary fibrosis ([place])   . Vitamin D deficiency    Past Surgical History:  Procedure Laterality Date  . BIOPSY  04/23/2018   Procedure: BIOPSY;  Surgeon: Danie Binder, MD;  [facility];  Service: Endoscopy;;  duodenum gastric  . CATARACT EXTRACTION W/PHACO  06/25/2012   Procedure: CATARACT EXTRACTION PHACO AND INTRAOCULAR LENS PLACEMENT (IOC);  Surgeon: Elta Guadeloupe T. Gershon Crane, MD;  Location: [facility];  Service: Ophthalmology;  Laterality: Left;  CDE=19.01  . CATARACT EXTRACTION W/PHACO  07/09/2012   Procedure: CATARACT  EXTRACTION PHACO AND INTRAOCULAR LENS PLACEMENT (IOC);  Surgeon: Elta Guadeloupe T. Gershon Crane, MD;  Location: [facility];  Service: Ophthalmology;  Laterality: Right;  CDE: 20.09  . COLONOSCOPY WITH PROPOFOL N/A 04/23/2018   Procedure: COLONOSCOPY WITH PROPOFOL;  Surgeon: Danie Binder, MD;  [facility];  Service: Endoscopy;  Laterality: N/A;  . ESOPHAGOGASTRODUODENOSCOPY (EGD) WITH PROPOFOL N/A 04/23/2018   Procedure: ESOPHAGOGASTRODUODENOSCOPY (EGD) WITH PROPOFOL;  Surgeon: Danie Binder, MD;  [facility];  Service: Endoscopy;  Laterality: N/A;   Current Outpatient Medications on File Prior to Visit  Medication Sig Dispense Refill  . albuterol (PROVENTIL) (2.5 MG/3ML) 0.083% nebulizer solution INHALE 1 VIAL VIA NEBULIZER EVERY 6 HOURS AS NEEDED FOR WHEEZING OR SHORTNESS OF BREATH 360 mL 4  . apixaban (ELIQUIS) 2.5 MG TABS tablet Take 1 tablet (2.5 mg total) by mouth 2 (two) times daily. Restart on 7/24 120 tablet 0  . aspirin EC 81 MG tablet Take 1 tablet (81 mg total) by mouth daily. Resume on 7/24    . cloNIDine (CATAPRES) 0.1 MG tablet Take 1 tablet (0.1 mg total) by mouth 2 (two) times daily. 60 tablet 3  . diltiazem (CARDIZEM CD) 240 MG 24 hr capsule TAKE 1 CAPSULE BY MOUTH DAILY 90 capsule 4  . furosemide (LASIX)  40 MG tablet Take 1 tablet (40 mg total) by mouth daily. 90 tablet 3  . glucose blood (ONE TOUCH ULTRA TEST) test strip CHECK FASTING BLOOD SUGAR TWICE DAILY 100 each 4  . Insulin Glargine (BASAGLAR KWIKPEN) 100 UNIT/ML SOPN Inject 0.1 mLs (10 Units total) into the skin daily. (Patient taking differently: Inject 20 Units into the skin 2 (two) times daily. )    . ipratropium (ATROVENT) 0.02 % nebulizer solution Inhale 3 mLs into the lungs every 6 (six) hours as needed.    . metoprolol tartrate (LOPRESSOR) 25 MG tablet TAKE 1 TABLET BY MOUTH TWICE DAILY 60 tablet 3  . mometasone-formoterol (DULERA) 200-5 MCG/ACT AERO Inhale 2 puffs into the lungs 2 (two) times daily. 13 g 5   . ONETOUCH DELICA LANCETS 29J MISC USE TO CHECK BLOOD SUGAR TWICE DAILY AS DIRECTED 100 each 2  . OXYGEN Inhale 3 L into the lungs continuous.     . polyethylene glycol (MIRALAX / GLYCOLAX) packet Take 17 g by mouth daily as needed for mild constipation or moderate constipation.    . potassium chloride SA (K-DUR,KLOR-CON) 20 MEQ tablet Take 1 tablet (20 mEq total) by mouth daily. When he takes lasix 30 tablet 3  . pravastatin (PRAVACHOL) 80 MG tablet TAKE 1 TABLET BY MOUTH AT BEDTIME 90 tablet 4  . Tiotropium Bromide Monohydrate (SPIRIVA RESPIMAT) 2.5 MCG/ACT AERS Inhale 2 puffs into the lungs daily. 1 Inhaler 5   No current facility-administered medications on file prior to visit.    Allergies  Allergen Reactions  . Ace Inhibitors Other (See Comments)    Hyperkalemia--07/23/2013:patient states not familiar with the following allergy   Social History   Socioeconomic History  . Marital status: Married    Spouse name: Not on file  . Number of children: Not on file  . Years of education: Not on file  . Highest education level: Not on file  Occupational History  . 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 Financial resource strain: Not on file  . Food insecurity:    Worry: Not on file    Inability: Not on file  . Transportation needs:    Medical: Not on file    Non-medical: Not on file  Tobacco Use  . Smoking status: Former Smoker    Packs/day: 1.50    Years: 60.00    Pack years: 90.00    Types: Cigarettes    Last attempt to quit: 12/31/2012    Years since quitting: 6.0  . Smokeless tobacco: Never Used  Substance and Sexual Activity  . Alcohol use: No  . Drug use: No  . Sexual activity: Yes    Birth control/protection: None  Lifestyle  . Physical activity:    Days per week: Not on file    Minutes per session: Not on file  . Stress: Not on file  Relationships  . Social connections:    Talks on phone: Not on file    Gets together: Not on file     Attends religious service: Not on file    Active member of club or organization: Not on file    Attends meetings of clubs or organizations: Not on file    Relationship status: Not on file  . Intimate partner violence:    Fear of current or ex partner: Not on file    Emotionally abused: Not on file    Physically abused: Not on file    Forced sexual activity:  Not on file  Other Topics Concern  . Not on file  Social History Narrative  . Not on file     Review of Systems  All other systems reviewed and are negative.      Objective:   Physical Exam Vitals signs reviewed.  Constitutional:      General: He is not in acute distress.    Appearance: Normal appearance. He is obese. He is not ill-appearing, toxic-appearing or diaphoretic.  HENT:     Nose: Nose normal. No congestion or rhinorrhea.  Cardiovascular:     Rate and Rhythm: Normal rate and regular rhythm.     Heart sounds: Normal heart sounds. No murmur. No gallop.   Pulmonary:     Effort: Pulmonary effort is normal. Prolonged expiration present. No tachypnea, accessory muscle usage or respiratory distress.     Breath sounds: Decreased air movement present. No stridor. Decreased breath sounds and wheezing present. No rhonchi or rales.  Musculoskeletal:     Right lower leg: No edema.     Left lower leg: No edema.  Neurological:     Mental Status: He is alert.           Assessment & Plan:  COPD exacerbation  Begin prednisone 60 mg a day for 7 days.  Begin doxycycline 100 mg p.o. twice daily for 10 days.  Recheck in 1 week if no better or sooner if worse.  Continue Dulera and Spiriva as maintenance medication and continue to use albuterol 2 puffs every 6 hours as needed for wheezing.  Can use Mucinex for chest congestion.

## 2019-01-01 DIAGNOSIS — J449 Chronic obstructive pulmonary disease, unspecified: Secondary | ICD-10-CM | POA: Diagnosis not present

## 2019-01-09 ENCOUNTER — Other Ambulatory Visit: Payer: Self-pay | Admitting: Pharmacy Technician

## 2019-01-09 NOTE — Patient Outreach (Signed)
[place] [facility]) Care Management  01/09/2019  Edward [name] Edward [name] Feb 21, 1935 [id number]   Unsuccessful call placed to patient to review patient assistance approvals and how to obtain refills from companies. Left contact information with patients son, who states [name] isn't feeling well today.  Will make another attempt to reach patient in 2-3 business days if call has not been returned.  [name] [place] Certified Pharmacy Technician [place] Management Direct [phone]

## 2019-01-28 ENCOUNTER — Other Ambulatory Visit: Payer: Self-pay | Admitting: *Deleted

## 2019-01-28 NOTE — Patient Outreach (Signed)
Chart entry per Eleanor Rivers to close program.  Julie Farmer RNC, BSN [facility] Community Care Coordinator [phone]   

## 2019-01-29 ENCOUNTER — Other Ambulatory Visit: Payer: Self-pay | Admitting: Pharmacy Technician

## 2019-01-29 ENCOUNTER — Other Ambulatory Visit: Payer: Self-pay | Admitting: Family Medicine

## 2019-01-29 NOTE — Patient Outreach (Signed)
[place] [facility]) Care Management  01/29/2019  Edward [name] Oct 12, 1934 [id number]    Successful call placed to patient regarding patient assistance medication receipt from [place], HIPAA identifiers verified. Edward [name] confirms his son was able to place a refill for [facility] and [name] to day. His son Edward [name], states the person told him it would arrive in 7-10 business days. Edward [name] also confirmed that Edward [name] has received his Spiriva Respimat thru [name]. Offered to send a letter that states medication approval and company phone numbers that they are able to obtain refills.  Follow up:  Will route note to [place] for case closure.  [name] [place] Certified Pharmacy Technician [place] Management Direct Dial:[phone]

## 2019-01-31 ENCOUNTER — Other Ambulatory Visit: Payer: Self-pay | Admitting: Pharmacist

## 2019-01-31 ENCOUNTER — Ambulatory Visit: Payer: Self-pay | Admitting: Pharmacist

## 2019-01-31 DIAGNOSIS — J449 Chronic obstructive pulmonary disease, unspecified: Secondary | ICD-10-CM | POA: Diagnosis not present

## 2019-01-31 NOTE — Patient Outreach (Signed)
[place] [facility] [facility]) Care Management [place]  01/31/2019  Naif MAXIMIANO LOTT 1935-01-11 [id number]  Per communication from [place], patient approved for [facility], Proventil, and Spiriva through patient assistance programs.    Plan: [facility] pharmacy case is being closed due to the following reasons:  Goals have been met.  Patient has been provided [facility] CM contact information if assistance needed in the future.    Thank you for allowing [facility] pharmacy to be involved in this patient's care.    Ralene Bathe, PharmD, [place] [phone]

## 2019-02-10 ENCOUNTER — Other Ambulatory Visit: Payer: Self-pay

## 2019-02-10 ENCOUNTER — Ambulatory Visit: Payer: Medicare Other | Admitting: Family Medicine

## 2019-02-10 ENCOUNTER — Ambulatory Visit (INDEPENDENT_AMBULATORY_CARE_PROVIDER_SITE_OTHER): Payer: Medicare Other | Admitting: Family Medicine

## 2019-02-10 ENCOUNTER — Encounter: Payer: Self-pay | Admitting: Family Medicine

## 2019-02-10 VITALS — BP 120/60 | HR 74 | Temp 98.7°F | Resp 20 | Ht 65.0 in | Wt 185.0 lb

## 2019-02-10 DIAGNOSIS — I5032 Chronic diastolic (congestive) heart failure: Secondary | ICD-10-CM

## 2019-02-10 DIAGNOSIS — I4819 Other persistent atrial fibrillation: Secondary | ICD-10-CM

## 2019-02-10 DIAGNOSIS — E1165 Type 2 diabetes mellitus with hyperglycemia: Secondary | ICD-10-CM

## 2019-02-10 DIAGNOSIS — N183 Chronic kidney disease, stage 3 unspecified: Secondary | ICD-10-CM

## 2019-02-10 DIAGNOSIS — J449 Chronic obstructive pulmonary disease, unspecified: Secondary | ICD-10-CM | POA: Diagnosis not present

## 2019-02-10 NOTE — Progress Notes (Signed)
Subjective:    Patient ID: Edward [name], male    DOB: Feb 16, 1935, 84 y.o.   MRN: [id number]  HPI  12/31/18 Patient presents today with cough and shortness of breath.  Symptoms began approximately 1 week ago.  Patient states that he is wheezing so bad that he is unable to sleep at night.  Whenever he lays down at night his cough worsens.  He reports chest congestion and wheezing particularly worse at night.  He is using his Dulera and Spiriva as prescribed.  He is using albuterol 2 puffs every 6 hours and yet his wheezing and chest congestion seem to be worsening.  He denies any fever.  He denies any purulent sputum.  He states that he is unable to cough up the congestion in his chest and his airways.  He is unable to generate enough air pressure to break it up to produce the sputum but he can feel mucus clogging his airways.  He denies any pitting edema in his legs.  He denies any chest pain.  He denies any hemoptysis.  He denies any pleurisy.  At that  Time, my plan was: Begin prednisone 60 mg a day for 7 days.  Begin doxycycline 100 mg p.o. twice daily for 10 days.  Recheck in 1 week if no better or sooner if worse.  Continue Dulera and Spiriva as maintenance medication and continue to use albuterol 2 puffs every 6 hours as needed for wheezing.  Can use Mucinex for chest congestion.  02/10/19  Patient is here today for a follow-up of his diabetes.  He is taking his insulin 20 units twice a day.  He is only checking his blood sugars in the morning fasting.  However he brings a list of low sugars for me.  They vary widely.  The range of sugars is between 87 and 190.  The vast majority are between 126-1/71 thing in the morning.  However they fluctuate from day-to-day for instance he may be 190 on 1 day.  The next day he may be 87.  Therefore I am concerned about increasing his insulin given the fact he is not checking his sugars frequently and his diet appears to fluctuate because sugars to fluctuate.   Cost is also an issue for this patient.  He is on financial assistance to get his COPD medication.  Therefore he cannot afford expensive medication such as Trulicity.  Also concerned that he would be a poor candidate for sliding scale insulin given comprehension and the complexity of instituting this policy.  However he is apparently taking metformin in the past without any difficulty and his last creatinine in February showed his GFR to be well above 45.  Therefore I believe he could use metformin as an insulin sensitizer without any difficulty.  From the standpoint of his COPD his breathing has been doing extremely well.  He is using the [place] and the Spiriva daily.  He denies any recent exacerbations since I last saw him.  He does have to use his albuterol 2-3 times a day for wheezing but otherwise is doing well.  His legs are not swelling and he denies any fluid retention.  On pulmonary exam today he does have diffuse expiratory wheezing however it is much fainter than previous and he has good air exchange compared to last time.  There is no evidence of crackles or pulmonary edema and there is no pitting edema in his extremities.  His blood pressure today is well controlled.  He denies any angina or chest pain or pleurisy or hemoptysis or fevers or chills Past Medical History:  Diagnosis Date   Allergy    Rhinitis   Atrial fibrillation ([name])    Bronchitis    Chronic respiratory failure ([name])    Colon polyps    COPD (chronic obstructive pulmonary disease) ([name])    Diabetes mellitus    Elevated lipids    Hypercholesterolemia    Hypertension    Iron deficiency anemia due to chronic blood loss 04/30/2018   Noncompliance    On home O2    2L N/C    PSA elevation    Pulmonary fibrosis ([name])    Vitamin D deficiency    Past Surgical History:  Procedure Laterality Date   BIOPSY  04/23/2018   Procedure: BIOPSY;  Surgeon: Danie Binder, MD;  [facility];  Service:  Endoscopy;;  duodenum gastric   CATARACT EXTRACTION W/PHACO  06/25/2012   Procedure: CATARACT EXTRACTION PHACO AND INTRAOCULAR LENS PLACEMENT ([place]);  Surgeon: Elta Guadeloupe T. Gershon Crane, MD;  Location: [facility];  Service: Ophthalmology;  Laterality: Left;  CDE=19.01   CATARACT EXTRACTION W/PHACO  07/09/2012   Procedure: CATARACT EXTRACTION PHACO AND INTRAOCULAR LENS PLACEMENT (IOC);  Surgeon: Elta Guadeloupe T. Gershon Crane, MD;  Location: [facility];  Service: Ophthalmology;  Laterality: Right;  CDE: 20.09   COLONOSCOPY WITH PROPOFOL N/A 04/23/2018   Procedure: COLONOSCOPY WITH PROPOFOL;  Surgeon: Danie Binder, MD;  [facility];  Service: Endoscopy;  Laterality: N/A;   ESOPHAGOGASTRODUODENOSCOPY (EGD) WITH PROPOFOL N/A 04/23/2018   Procedure: ESOPHAGOGASTRODUODENOSCOPY (EGD) WITH PROPOFOL;  Surgeon: Danie Binder, MD;  [facility];  Service: Endoscopy;  Laterality: N/A;   Current Outpatient Medications on File Prior to Visit  Medication Sig Dispense Refill   albuterol (PROVENTIL) (2.5 MG/3ML) 0.083% nebulizer solution INHALE 1 VIAL VIA NEBULIZER EVERY 6 HOURS AS NEEDED FOR WHEEZING OR SHORTNESS OF BREATH 360 mL 4   apixaban (ELIQUIS) 2.5 MG TABS tablet Take 1 tablet (2.5 mg total) by mouth 2 (two) times daily. Restart on 7/24 120 tablet 0   aspirin EC 81 MG tablet Take 1 tablet (81 mg total) by mouth daily. Resume on 7/24     cloNIDine (CATAPRES) 0.1 MG tablet TAKE 1 TABLET BY MOUTH 2 TIMES A DAY 60 tablet 3   diltiazem (CARDIZEM CD) 240 MG 24 hr capsule TAKE 1 CAPSULE BY MOUTH DAILY 90 capsule 4   doxycycline (VIBRA-TABS) 100 MG tablet Take 1 tablet (100 mg total) by mouth 2 (two) times daily. 20 tablet 0   furosemide (LASIX) 40 MG tablet Take 1 tablet (40 mg total) by mouth daily. 90 tablet 3   glucose blood (ONE TOUCH ULTRA TEST) test strip CHECK FASTING BLOOD SUGAR TWICE DAILY 100 each 4   Insulin Glargine (BASAGLAR KWIKPEN) 100 UNIT/ML SOPN Inject 0.1 mLs (10 Units total) into the  skin daily. (Patient taking differently: Inject 20 Units into the skin 2 (two) times daily. )     ipratropium (ATROVENT) 0.02 % nebulizer solution Inhale 3 mLs into the lungs every 6 (six) hours as needed.     Lancets (ONETOUCH DELICA PLUS [id number]) MISC USE TO CHECK BLOOD SUGAR TWICE DAILY AS DIRECTED 100 each 2   metoprolol tartrate (LOPRESSOR) 25 MG tablet TAKE 1 TABLET BY MOUTH TWICE DAILY 60 tablet 3   mometasone-formoterol (DULERA) 200-5 MCG/ACT AERO Inhale 2 puffs into the lungs 2 (two) times daily. 13 g 5   OXYGEN Inhale 3  L into the lungs continuous.      polyethylene glycol (MIRALAX / GLYCOLAX) packet Take 17 g by mouth daily as needed for mild constipation or moderate constipation.     potassium chloride SA (K-DUR,KLOR-CON) 20 MEQ tablet Take 1 tablet (20 mEq total) by mouth daily. When he takes lasix 30 tablet 3   pravastatin (PRAVACHOL) 80 MG tablet TAKE 1 TABLET BY MOUTH AT BEDTIME 90 tablet 4   predniSONE (DELTASONE) 20 MG tablet Take 3 tablets (60 mg total) by mouth daily with breakfast. 21 tablet 0   Tiotropium Bromide Monohydrate (SPIRIVA RESPIMAT) 2.5 MCG/ACT AERS Inhale 2 puffs into the lungs daily. 1 Inhaler 5   No current facility-administered medications on file prior to visit.    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 on file   Food insecurity:    Worry: Not on file    Inability: Not on file   Transportation needs:    Medical: Not on file    Non-medical: Not on file  Tobacco Use   Smoking status: Former  Smoker    Packs/day: 1.50    Years: 60.00    Pack years: 90.00    Types: Cigarettes    Last attempt to quit: 12/31/2012    Years since quitting: 6.1   Smokeless tobacco: Never Used  Substance and Sexual Activity   Alcohol use: No   Drug use: No   Sexual activity: Yes    Birth control/protection: None  Lifestyle   Physical activity:    Days per week: Not on file    Minutes per session: Not on file   Stress: Not on file  Relationships   Social connections:    Talks on phone: Not on file    Gets together: Not on file    Attends religious service: Not on file    Active member of club or organization: Not on file    Attends meetings of clubs or organizations: Not on file    Relationship status: Not on file   Intimate partner violence:    Fear of current or ex partner: Not on file    Emotionally abused: Not on file    Physically abused: Not on file    Forced sexual activity: Not on file  Other Topics Concern   Not on file  Social History Narrative   Not on file     Review of Systems  All other systems reviewed and are negative.      Objective:   Physical Exam Vitals signs reviewed.  Constitutional:      General: He is not in acute distress.    Appearance: Normal appearance. He is obese. He is not ill-appearing, toxic-appearing or diaphoretic.  HENT:     Nose: Nose normal. No congestion or rhinorrhea.  Cardiovascular:     Rate and Rhythm: Normal rate. Rhythm irregular.     Heart sounds: Normal heart sounds. No murmur. No gallop.   Pulmonary:     Effort: Pulmonary effort is normal. Prolonged expiration present.  No tachypnea, accessory muscle usage or respiratory distress.     Breath sounds: Decreased air movement present. No stridor. Decreased breath sounds and wheezing present. No rhonchi or rales.  Abdominal:     General: Bowel sounds are normal.     Palpations: Abdomen is soft.  Musculoskeletal:     Right lower leg: No edema.     Left lower leg: No  edema.  Neurological:     Mental Status: He is alert.           Assessment & Plan:  Chronic obstructive pulmonary disease, unspecified COPD type ([place])  Chronic diastolic heart failure ([name])  CKD (chronic kidney disease), stage III ([place])  Uncontrolled type 2 diabetes mellitus with hyperglycemia ([place]) - Plan: CBC with Differential/Platelet, COMPLETE METABOLIC PANEL WITH GFR, Lipid panel, Microalbumin, urine, Hemoglobin A1c  Persistent atrial fibrillation  COPD is stable currently.  I instructed the patient to use the [facility] and the Spiriva every day to help prevent COPD exacerbations.  When he is consistent with his preventative medication he seems to be relatively well.  From standpoint of his diastolic heart failure there is no evidence of pulmonary edema or fluid overload on exam and his blood pressure today is well controlled.  His heart rate is regulated between 60 and 70 bpm on exam.  I will recheck a BMP to monitor his chronic kidney disease.  His diabetes is fairly well controlled however his fasting blood sugars are higher than I like to see.  Given the complexity of his situation I suspect that I will start the patient on metformin and gradually increase it to 1000 mg twice daily as long as his renal function can tolerate.  I will do this if his hemoglobin A1c is greater than 7.  He is currently in A. fib today however his heart rate is controlled and he is appropriately anticoagulated with Eliquis.  No other changes will be made at this time.

## 2019-02-11 LAB — MICROALBUMIN, URINE: Microalb, Ur: 144.9 mg/dL

## 2019-02-12 LAB — CBC WITH DIFFERENTIAL/PLATELET
Absolute Monocytes: 578 cells/uL (ref 200–950)
Basophils Absolute: 61 cells/uL (ref 0–200)
Basophils Relative: 0.8 %
Eosinophils Absolute: 258 cells/uL (ref 15–500)
Eosinophils Relative: 3.4 %
HCT: 36.5 % — ABNORMAL LOW (ref 38.5–50.0)
Hemoglobin: 12.1 g/dL — ABNORMAL LOW (ref 13.2–17.1)
Lymphs Abs: 2242 cells/uL (ref 850–3900)
MCH: 28.9 pg (ref 27.0–33.0)
MCHC: 33.2 g/dL (ref 32.0–36.0)
MCV: 87.3 fL (ref 80.0–100.0)
MPV: 10.3 fL (ref 7.5–12.5)
Monocytes Relative: 7.6 %
Neutro Abs: 4461 cells/uL (ref 1500–7800)
Neutrophils Relative %: 58.7 %
Platelets: 290 10*3/uL (ref 140–400)
RBC: 4.18 10*6/uL — ABNORMAL LOW (ref 4.20–5.80)
RDW: 13.5 % (ref 11.0–15.0)
Total Lymphocyte: 29.5 %
WBC: 7.6 10*3/uL (ref 3.8–10.8)

## 2019-02-12 LAB — COMPLETE METABOLIC PANEL WITH GFR
AG Ratio: 1.4 (calc) (ref 1.0–2.5)
ALT: 9 U/L (ref 9–46)
AST: 14 U/L (ref 10–35)
Albumin: 3.4 g/dL — ABNORMAL LOW (ref 3.6–5.1)
Alkaline phosphatase (APISO): 81 U/L (ref 35–144)
BUN: 25 mg/dL (ref 7–25)
CO2: 34 mmol/L — ABNORMAL HIGH (ref 20–32)
Calcium: 9 mg/dL (ref 8.6–10.3)
Chloride: 97 mmol/L — ABNORMAL LOW (ref 98–110)
Creat: 0.95 mg/dL (ref 0.70–1.11)
GFR, Est African American: 87 mL/min/{1.73_m2} (ref >=60)
GFR, Est Non African American: 75 mL/min/{1.73_m2} (ref >=60)
Globulin: 2.4 g/dL (calc) (ref 1.9–3.7)
Glucose, Bld: 119 mg/dL — ABNORMAL HIGH (ref 65–99)
Potassium: 4.1 mmol/L (ref 3.5–5.3)
Sodium: 138 mmol/L (ref 135–146)
Total Bilirubin: 0.2 mg/dL (ref 0.2–1.2)
Total Protein: 5.8 g/dL — ABNORMAL LOW (ref 6.1–8.1)

## 2019-02-12 LAB — LIPID PANEL
Cholesterol: 225 mg/dL — ABNORMAL HIGH (ref <200)
HDL: 56 mg/dL (ref >=40)
LDL Cholesterol (Calc): 131 mg/dL (calc) — ABNORMAL HIGH
Non-HDL Cholesterol (Calc): 169 mg/dL (calc) — ABNORMAL HIGH (ref <130)
Total CHOL/HDL Ratio: 4 (calc) (ref <5.0)
Triglycerides: 238 mg/dL — ABNORMAL HIGH (ref <150)

## 2019-02-12 LAB — HEMOGLOBIN A1C
Hgb A1c MFr Bld: 10.3 % of total Hgb — ABNORMAL HIGH (ref <5.7)
Mean Plasma Glucose: 249 (calc)
eAG (mmol/L): 13.8 (calc)

## 2019-02-25 ENCOUNTER — Other Ambulatory Visit: Payer: Self-pay | Admitting: Family Medicine

## 2019-02-25 DIAGNOSIS — E1165 Type 2 diabetes mellitus with hyperglycemia: Secondary | ICD-10-CM

## 2019-03-03 DIAGNOSIS — J449 Chronic obstructive pulmonary disease, unspecified: Secondary | ICD-10-CM | POA: Diagnosis not present

## 2019-03-06 ENCOUNTER — Encounter: Payer: Self-pay | Admitting: "Endocrinology

## 2019-03-06 ENCOUNTER — Other Ambulatory Visit: Payer: Self-pay

## 2019-03-06 ENCOUNTER — Ambulatory Visit (INDEPENDENT_AMBULATORY_CARE_PROVIDER_SITE_OTHER): Payer: Medicare Other | Admitting: "Endocrinology

## 2019-03-06 VITALS — BP 154/78 | HR 77 | Temp 98.0°F | Ht 65.0 in | Wt 184.0 lb

## 2019-03-06 DIAGNOSIS — E1159 Type 2 diabetes mellitus with other circulatory complications: Secondary | ICD-10-CM | POA: Diagnosis not present

## 2019-03-06 DIAGNOSIS — E782 Mixed hyperlipidemia: Secondary | ICD-10-CM

## 2019-03-06 MED ORDER — BASAGLAR KWIKPEN 100 UNIT/ML ~~LOC~~ SOPN: 30.0000 [IU] | PEN_INJECTOR | Freq: Every day | SUBCUTANEOUS | Status: DC

## 2019-03-06 NOTE — Patient Instructions (Signed)

## 2019-03-06 NOTE — Progress Notes (Signed)
Endocrinology Consult Note       03/06/2019, 5:46 PM   Subjective:    Patient ID: Edward [name], male    DOB: 03/07/1935.  Edward [name] is being seen in consultation for management of currently uncontrolled symptomatic diabetes requested by  Susy Frizzle, MD.   Past Medical History:  Diagnosis Date  . Allergy    Rhinitis  . Atrial fibrillation ([place])   . Bronchitis   . Chronic respiratory failure ([place])   . Colon polyps   . COPD (chronic obstructive pulmonary disease) ([place])   . Diabetes mellitus   . Elevated lipids   . Hypercholesterolemia   . Hypertension   . Iron deficiency anemia due to chronic blood loss 04/30/2018  . Noncompliance   . On home O2    2L N/C   . PSA elevation   . Pulmonary fibrosis ([place])   . Vitamin D deficiency     Past Surgical History:  Procedure Laterality Date  . BIOPSY  04/23/2018   Procedure: BIOPSY;  Surgeon: Danie Binder, MD;  [facility]: [facility];  Service: Endoscopy;;  duodenum gastric  . CATARACT EXTRACTION W/PHACO  06/25/2012   Procedure: CATARACT EXTRACTION PHACO AND INTRAOCULAR LENS PLACEMENT (IOC);  Surgeon: Elta Guadeloupe T. Gershon Crane, MD;  Location: [facility];  Service: Ophthalmology;  Laterality: Left;  CDE=19.01  . CATARACT EXTRACTION W/PHACO  07/09/2012   Procedure: CATARACT EXTRACTION PHACO AND INTRAOCULAR LENS PLACEMENT (IOC);  Surgeon: Elta Guadeloupe T. Gershon Crane, MD;  Location: [facility];  Service: Ophthalmology;  Laterality: Right;  CDE: 20.09  . COLONOSCOPY WITH PROPOFOL N/A 04/23/2018   Procedure: COLONOSCOPY WITH PROPOFOL;  Surgeon: Danie Binder, MD;  [facility];  Service: Endoscopy;  Laterality: N/A;  . ESOPHAGOGASTRODUODENOSCOPY (EGD) WITH PROPOFOL N/A 04/23/2018   Procedure: ESOPHAGOGASTRODUODENOSCOPY (EGD) WITH PROPOFOL;  Surgeon: Danie Binder, MD;  [facility];  Service: Endoscopy;  Laterality: N/A;    Social History    Socioeconomic History  . Marital status: Married    Spouse name: Not on file  . Number of children: Not on file  . Years of education: Not on file  . Highest education level: Not on file  Occupational History  . 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 Financial resource strain: Not on file  . Food insecurity:    Worry: Not on file    Inability: Not on file  . Transportation needs:    Medical: Not on file    Non-medical: Not on file  Tobacco Use  . Smoking status: Former Smoker    Packs/day: 1.50    Years: 60.00    Pack years: 90.00    Types: Cigarettes    Last attempt to quit: 12/31/2012    Years since quitting: 6.1  . Smokeless tobacco: Never Used  Substance and Sexual Activity  . Alcohol use: No  . Drug use: No  . Sexual activity: Yes    Birth control/protection: None  Lifestyle  . Physical activity:    Days per week: Not on file    Minutes per session: Not on file  .  Stress: Not on file  Relationships  . Social connections:    Talks on phone: Not on file    Gets together: Not on file    Attends religious service: Not on file    Active member of club or organization: Not on file    Attends meetings of clubs or organizations: Not on file    Relationship status: Not on file  Other Topics Concern  . Not on file  Social History Narrative  . Not on file    Family History  Problem Relation Age of Onset  . Heart disease Mother   . CAD Other   . Diabetes Other     Outpatient Encounter Medications as of 03/06/2019  Medication Sig  . albuterol (PROVENTIL) (2.5 MG/3ML) 0.083% nebulizer solution INHALE 1 VIAL VIA NEBULIZER EVERY 6 HOURS AS NEEDED FOR WHEEZING OR SHORTNESS OF BREATH  . apixaban (ELIQUIS) 2.5 MG TABS tablet Take 1 tablet (2.5 mg total) by mouth 2 (two) times daily. Restart on 7/24  . aspirin EC 81 MG tablet Take 1 tablet (81 mg total) by mouth daily. Resume on 7/24  . cloNIDine (CATAPRES) 0.1 MG tablet TAKE 1 TABLET BY  MOUTH 2 TIMES A DAY  . diltiazem (CARDIZEM CD) 240 MG 24 hr capsule TAKE 1 CAPSULE BY MOUTH DAILY  . furosemide (LASIX) 40 MG tablet Take 1 tablet (40 mg total) by mouth daily.  Marland Kitchen glucose blood (ONE TOUCH ULTRA TEST) test strip CHECK FASTING BLOOD SUGAR TWICE DAILY  . Insulin Glargine (BASAGLAR KWIKPEN) 100 UNIT/ML SOPN Inject 0.3 mLs (30 Units total) into the skin at bedtime.  Marland Kitchen ipratropium (ATROVENT) 0.02 % nebulizer solution Inhale 3 mLs into the lungs every 6 (six) hours as needed.  . Lancets (ONETOUCH DELICA PLUS [id number]) MISC USE TO CHECK BLOOD SUGAR TWICE DAILY AS DIRECTED  . metoprolol tartrate (LOPRESSOR) 25 MG tablet TAKE 1 TABLET BY MOUTH TWICE DAILY  . mometasone-formoterol (DULERA) 200-5 MCG/ACT AERO Inhale 2 puffs into the lungs 2 (two) times daily.  . OXYGEN Inhale 3 L into the lungs continuous.   . polyethylene glycol (MIRALAX / GLYCOLAX) packet Take 17 g by mouth daily as needed for mild constipation or moderate constipation.  . potassium chloride SA (K-DUR,KLOR-CON) 20 MEQ tablet Take 1 tablet (20 mEq total) by mouth daily. When he takes lasix  . pravastatin (PRAVACHOL) 80 MG tablet TAKE 1 TABLET BY MOUTH AT BEDTIME  . Tiotropium Bromide Monohydrate (SPIRIVA RESPIMAT) 2.5 MCG/ACT AERS Inhale 2 puffs into the lungs daily.  . [DISCONTINUED] Insulin Glargine (BASAGLAR KWIKPEN) 100 UNIT/ML SOPN Inject 0.1 mLs (10 Units total) into the skin daily. (Patient taking differently: Inject 20 Units into the skin 2 (two) times daily. )   No facility-administered encounter medications on file as of 03/06/2019.     ALLERGIES: Allergies  Allergen Reactions  . Ace Inhibitors Other (See Comments)    Hyperkalemia--07/23/2013:patient states not familiar with the following allergy    VACCINATION STATUS: Immunization History  Administered Date(s) Administered  . Pneumococcal Conjugate-13 10/23/2013  . Pneumococcal Polysaccharide-23 03/02/2002  . Td 06/02/2004    Diabetes  He presents  for his initial diabetic visit. He has type 2 diabetes mellitus. His disease course has been worsening. There are no hypoglycemic associated symptoms. Pertinent negatives for hypoglycemia include no confusion, headaches, pallor or seizures. Associated symptoms include blurred vision, polydipsia and polyuria. Pertinent negatives for diabetes include no chest pain, no fatigue, no polyphagia and no weakness. There are no hypoglycemic complications. Symptoms  are worsening. Risk factors for coronary artery disease include dyslipidemia, diabetes mellitus, hypertension, male sex, sedentary lifestyle, tobacco exposure and obesity. Current diabetic treatment includes insulin injections. His weight is fluctuating minimally. He is following a generally unhealthy diet. When asked about meal planning, he reported none. He has not had a previous visit with a dietitian. His overall blood glucose range is >200 mg/dl. (He reports that he monitor blood glucose at least 1 time a day, did not bring any meter or log or logs.  His A1c was 10.3% recently.)  Hyperlipidemia  This is a chronic problem. The current episode started more than 1 year ago. Associated symptoms include shortness of breath. Pertinent negatives include no chest pain or myalgias. Current antihyperlipidemic treatment includes statins. Risk factors for coronary artery disease include diabetes mellitus, dyslipidemia, male sex and a sedentary lifestyle.     Review of Systems  Constitutional: Negative for chills, fatigue, fever and unexpected weight change.  HENT: Negative for dental problem, mouth sores and trouble swallowing.   Eyes: Positive for blurred vision. Negative for visual disturbance.  Respiratory: Positive for shortness of breath. Negative for cough, choking, chest tightness and wheezing.        He has advanced COPD on multiple bronchodilators.  Patient uses oxygen portable oxygen canister at home.  Cardiovascular: Negative for chest pain,  palpitations and leg swelling.  Gastrointestinal: Negative for abdominal distention, abdominal pain, constipation, diarrhea, nausea and vomiting.  Endocrine: Positive for polydipsia and polyuria. Negative for polyphagia.  Genitourinary: Negative for dysuria, flank pain, hematuria and urgency.  Musculoskeletal: Negative for back pain, gait problem, myalgias and neck pain.  Skin: Negative for pallor, rash and wound.  Neurological: Negative for seizures, syncope, weakness, numbness and headaches.  Psychiatric/Behavioral: Negative.  Negative for confusion and dysphoric mood.    Objective:    BP (!) 154/78   Pulse 77   Temp 98 F (36.7 C)   Ht 5\' 5"  (1.651 m)   Wt 184 lb (83.5 kg)   BMI 30.62 kg/m   Wt Readings from Last 3 Encounters:  03/06/19 184 lb (83.5 kg)  02/10/19 185 lb (83.9 kg)  12/31/18 184 lb (83.5 kg)     Physical Exam Constitutional:      General: He is not in acute distress.    Appearance: He is well-developed.  HENT:     Head: Normocephalic and atraumatic.  Neck:     Musculoskeletal: Normal range of motion and neck supple.     Thyroid: No thyromegaly.     Trachea: No tracheal deviation.  Cardiovascular:     Rate and Rhythm: Normal rate.     Pulses:          Dorsalis pedis pulses are 1+ on the right side and 1+ on the left side.       Posterior tibial pulses are 1+ on the right side and 1+ on the left side.     Heart sounds: Normal heart sounds, S1 normal and S2 normal. No murmur. No gallop.   Pulmonary:     Effort: Pulmonary effort is normal. No respiratory distress.     Breath sounds: No wheezing.  Abdominal:     General: There is no distension.     Tenderness: There is no abdominal tenderness. There is no guarding.  Musculoskeletal:     Right shoulder: He exhibits no swelling and no deformity.  Skin:    General: Skin is warm and dry.     Findings: No rash.  Nails: There is no clubbing.   Neurological:     Mental Status: He is alert and oriented  to person, place, and time.     Cranial Nerves: No cranial nerve deficit.     Sensory: No sensory deficit.     Gait: Gait normal.     Deep Tendon Reflexes: Reflexes are normal and symmetric.     Comments: Elderly patient.  Hard of hearing.  Psychiatric:        Speech: Speech normal.        Behavior: Behavior is cooperative.      CMP ( most recent) CMP     Component Value Date/Time   NA 138 02/10/2019 0916   K 4.1 02/10/2019 0916   CL 97 (L) 02/10/2019 0916   CO2 34 (H) 02/10/2019 0916   GLUCOSE 119 (H) 02/10/2019 0916   BUN 25 02/10/2019 0916   CREATININE 0.95 02/10/2019 0916   CALCIUM 9.0 02/10/2019 0916   PROT 5.8 (L) 02/10/2019 0916   ALBUMIN 2.1 (L) 11/01/2018 0452   AST 14 02/10/2019 0916   ALT 9 02/10/2019 0916   ALKPHOS 57 11/01/2018 0452   BILITOT 0.2 02/10/2019 0916   GFRNONAA 75 02/10/2019 0916   GFRAA 87 02/10/2019 0916     Diabetic Labs (most recent): Lab Results  Component Value Date   HGBA1C 10.3 (H) 02/10/2019   HGBA1C 8.1 (H) 09/09/2018   HGBA1C 6.9 (H) 06/17/2018     Lipid Panel ( most recent) Lipid Panel     Component Value Date/Time   CHOL 225 (H) 02/10/2019 0916   TRIG 238 (H) 02/10/2019 0916   HDL 56 02/10/2019 0916   CHOLHDL 4.0 02/10/2019 0916   VLDL 25 09/26/2017 0655   LDLCALC 131 (H) 02/10/2019 0916      Lab Results  Component Value Date   TSH 1.010 02/17/2018   TSH 3.123 09/25/2017   TSH 2.757 02/26/2017   FREET4 0.57 (L) 02/17/2018      Assessment & Plan:   1. DM type 2 causing vascular disease ([place]) - [place] has currently uncontrolled symptomatic type 2 DM, (does not recall exact duration of his diabetes believes he had it for whole of his life),  with most recent A1c of 10.3 %. Recent labs reviewed.  -his diabetes is complicated by COPD/history of smoking , [name] of social support, and he remains at a high risk for more acute and chronic complications which include CAD, CVA, CKD, retinopathy, and neuropathy.  These are all discussed in detail with him.  - I have counseled him on diet management  by adopting a carbohydrate restricted/protein rich diet. - he admits that there is a room for improvement in his food and drink choices. - Suggestion is made for him to avoid simple carbohydrates  from his diet including Cakes, Sweet Desserts, Ice Cream, Soda (diet and regular), Sweet Tea, Candies, Chips, Cookies, Store Bought Juices, Alcohol in Excess of  1-2 drinks a day, Artificial Sweeteners,  Coffee Creamer, and "Sugar-free" Products. This will help patient to have more stable blood glucose profile and potentially avoid unintended weight gain.  - I encouraged him to switch to  unprocessed or minimally processed complex starch and increased protein intake (animal or plant source), fruits, and vegetables.  - he is advised to stick to a routine mealtimes to eat 3 meals  a day and avoid unnecessary snacks ( to snack only to correct hypoglycemia).   - he will be scheduled with Jearld Fenton,  RDN, CDE for individualized diabetes education.  - I have approached him with the following individualized plan to manage diabetes and patient agrees:   -He is high risk to give multiple daily injections of insulin.  Priority in his care would be to avoid hypoglycemia.  A1c target for him would be between 7.5 and 8%. - I advised him to lower the frequency of his Basaglar to 30 units nightly, start monitoring blood glucose 2 times a day at the minimum-before breakfast and before bed and return in 10 days with his meter and logs for evaluation.  - he is warned not to take insulin without proper monitoring per orders. - Adjustment parameters are given to him for hypo and hyperglycemia in writing. - he is encouraged to call clinic for blood glucose levels less than 70 or above 300 mg /dl. -He will be considered for low-dose metformin or glipizide on subsequent visits if he is not achieving reasonable target of glycemia.  -  Patient specific target  A1c;  LDL, HDL, Triglycerides, and  Waist Circumference were discussed in detail.  2) Blood Pressure /Hypertension:  his blood pressure is uncontrolled to target.   he is advised to continue his current medications including Toprol 25 mg p.o. daily with breakfast . 3) Lipids/Hyperlipidemia:   Review of his recent lipid panel showed un controlled  LDL at 131 .  he  is advised to continue    pravastatin 80 mg daily at bedtime.  Side effects and precautions discussed with him.  4)  Weight/Diet:  Body mass index is 30.62 kg/m.  -   clearly complicating his diabetes care.  I discussed with him the fact that loss of 5 - 10% of his  current body weight will have the most impact on his diabetes management.  CDE Consult will be initiated . Exercise, and detailed carbohydrates information provided  -  detailed on discharge instructions.  5) Chronic Care/Health Maintenance:  -he  is on  Statin medications and  is encouraged to initiate and continue to follow up with Ophthalmology, Dentist,  Podiatrist at least yearly or according to recommendations, and advised to  stay away from smoking. I have recommended yearly flu vaccine and pneumonia vaccine at least every 5 years; moderate intensity exercise for up to 150 minutes weekly; and  sleep for at least 7 hours a day.  - he is  advised to maintain close follow up with Susy Frizzle, MD for primary care needs, as well as his other providers for optimal and coordinated care.  - Time spent with the patient: 45 minutes, of which >50% was spent in obtaining information about his symptoms, reviewing his previous labs/studies, evaluations, and treatments, counseling him about his currently uncontrolled type 2 diabetes, hyperlipidemia, hypertension, and developing plans for long term treatment based on the latest standards of care/guidelines.  Please refer to " Patient Self Inventory" in the Media  tab for reviewed elements of pertinent patient  history.  [place] participated in the discussions, expressed understanding, and voiced agreement with the above plans.  All questions were answered to his satisfaction. he is encouraged to contact clinic should he have any questions or concerns prior to his return visit.  Follow up plan: - Return in about 10 days (around 03/16/2019) for Follow up with Meter and Logs Only - no Labs.  Glade Lloyd, MD [facility] [address] [place], [place] [address] Phone: [phone]  Fax: [phone]  03/06/2019, 5:46 PM  This note was partially dictated with voice recognition software. Similar sounding words can be transcribed inadequately or may not  be corrected upon review.

## 2019-03-17 ENCOUNTER — Other Ambulatory Visit: Payer: Self-pay

## 2019-03-17 ENCOUNTER — Encounter: Payer: Self-pay | Admitting: "Endocrinology

## 2019-03-17 ENCOUNTER — Ambulatory Visit (INDEPENDENT_AMBULATORY_CARE_PROVIDER_SITE_OTHER): Payer: Medicare Other | Admitting: "Endocrinology

## 2019-03-17 VITALS — BP 178/84 | HR 85 | Ht 65.0 in | Wt 175.6 lb

## 2019-03-17 DIAGNOSIS — E1159 Type 2 diabetes mellitus with other circulatory complications: Secondary | ICD-10-CM | POA: Diagnosis not present

## 2019-03-17 MED ORDER — METFORMIN HCL 500 MG PO TABS: 500.0000 mg | ORAL_TABLET | Freq: Two times a day (BID) | ORAL | 2 refills | Status: DC

## 2019-03-17 NOTE — Patient Instructions (Signed)

## 2019-03-17 NOTE — Progress Notes (Signed)
Endocrinology follow-up note       03/17/2019, 4:04 PM   Subjective:    Patient ID: Edward [name], male    DOB: Oct 21, 1934.  Edward [name] is being seen in follow-up for management of currently uncontrolled symptomatic diabetes requested by  Susy Frizzle, MD.   Past Medical History:  Diagnosis Date  . Allergy    Rhinitis  . Atrial fibrillation ([place])   . Bronchitis   . Chronic respiratory failure ([place])   . Colon polyps   . COPD (chronic obstructive pulmonary disease) ([place])   . Diabetes mellitus   . Elevated lipids   . Hypercholesterolemia   . Hypertension   . Iron deficiency anemia due to chronic blood loss 04/30/2018  . Noncompliance   . On home O2    2L N/C   . PSA elevation   . Pulmonary fibrosis ([place])   . Vitamin D deficiency     Past Surgical History:  Procedure Laterality Date  . BIOPSY  04/23/2018   Procedure: BIOPSY;  Surgeon: Danie Binder, MD;  Location: [facility];  Service: Endoscopy;;  duodenum gastric  . CATARACT EXTRACTION W/PHACO  06/25/2012   Procedure: CATARACT EXTRACTION PHACO AND INTRAOCULAR LENS PLACEMENT (IOC);  Surgeon: Elta Guadeloupe T. Gershon Crane, MD;  Location: [facility];  Service: Ophthalmology;  Laterality: Left;  CDE=19.01  . CATARACT EXTRACTION W/PHACO  07/09/2012   Procedure: CATARACT EXTRACTION PHACO AND INTRAOCULAR LENS PLACEMENT (IOC);  Surgeon: Elta Guadeloupe T. Gershon Crane, MD;  Location: [facility];  Service: Ophthalmology;  Laterality: Right;  CDE: 20.09  . COLONOSCOPY WITH PROPOFOL N/A 04/23/2018   Procedure: COLONOSCOPY WITH PROPOFOL;  Surgeon: Danie Binder, MD;  Location[facility];  Service: Endoscopy;  Laterality: N/A;  . ESOPHAGOGASTRODUODENOSCOPY (EGD) WITH PROPOFOL N/A 04/23/2018   Procedure: ESOPHAGOGASTRODUODENOSCOPY (EGD) WITH PROPOFOL;  Surgeon: Danie Binder, MD;  [facility];  Service: Endoscopy;  Laterality: N/A;    Social History    Socioeconomic History  . Marital status: Married    Spouse name: Not on file  . Number of children: Not on file  . Years of education: Not on file  . Highest education level: Not on file  Occupational History  . 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 Financial resource strain: Not on file  . Food insecurity    Worry: Not on file    Inability: Not on file  . Transportation needs    Medical: Not on file    Non-medical: Not on file  Tobacco Use  . Smoking status: Former Smoker    Packs/day: 1.50    Years: 60.00    Pack years: 90.00    Types: Cigarettes    Quit date: 12/31/2012    Years since quitting: 6.2  . Smokeless tobacco: Never Used  Substance and Sexual Activity  . Alcohol use: No  . Drug use: No  . Sexual activity: Yes    Birth control/protection: None  Lifestyle  . Physical activity    Days per week: Not on file    Minutes per session: Not on file  .  Stress: Not on file  Relationships  . Social Herbalist on phone: Not on file    Gets together: Not on file    Attends religious service: Not on file    Active member of club or organization: Not on file    Attends meetings of clubs or organizations: Not on file    Relationship status: Not on file  Other Topics Concern  . Not on file  Social History Narrative  . Not on file    Family History  Problem Relation Age of Onset  . Heart disease Mother   . CAD Other   . Diabetes Other     Outpatient Encounter Medications as of 03/17/2019  Medication Sig  . albuterol (PROVENTIL) (2.5 MG/3ML) 0.083% nebulizer solution INHALE 1 VIAL VIA NEBULIZER EVERY 6 HOURS AS NEEDED FOR WHEEZING OR SHORTNESS OF BREATH  . apixaban (ELIQUIS) 2.5 MG TABS tablet Take 1 tablet (2.5 mg total) by mouth 2 (two) times daily. Restart on 7/24  . aspirin EC 81 MG tablet Take 1 tablet (81 mg total) by mouth daily. Resume on 7/24  . cloNIDine (CATAPRES) 0.1 MG tablet TAKE 1 TABLET BY MOUTH 2 TIMES A  DAY  . diltiazem (CARDIZEM CD) 240 MG 24 hr capsule TAKE 1 CAPSULE BY MOUTH DAILY  . furosemide (LASIX) 40 MG tablet Take 1 tablet (40 mg total) by mouth daily.  Marland Kitchen glucose blood (ONE TOUCH ULTRA TEST) test strip CHECK FASTING BLOOD SUGAR TWICE DAILY  . Insulin Glargine (BASAGLAR KWIKPEN) 100 UNIT/ML SOPN Inject 0.3 mLs (30 Units total) into the skin at bedtime.  Marland Kitchen ipratropium (ATROVENT) 0.02 % nebulizer solution Inhale 3 mLs into the lungs every 6 (six) hours as needed.  . Lancets (ONETOUCH DELICA PLUS [id number]) MISC USE TO CHECK BLOOD SUGAR TWICE DAILY AS DIRECTED  . metFORMIN (GLUCOPHAGE) 500 MG tablet Take 1 tablet (500 mg total) by mouth 2 (two) times daily with a meal.  . metoprolol tartrate (LOPRESSOR) 25 MG tablet TAKE 1 TABLET BY MOUTH TWICE DAILY  . mometasone-formoterol (DULERA) 200-5 MCG/ACT AERO Inhale 2 puffs into the lungs 2 (two) times daily.  . OXYGEN Inhale 3 L into the lungs continuous.   . polyethylene glycol (MIRALAX / GLYCOLAX) packet Take 17 g by mouth daily as needed for mild constipation or moderate constipation.  . potassium chloride SA (K-DUR,KLOR-CON) 20 MEQ tablet Take 1 tablet (20 mEq total) by mouth daily. When he takes lasix  . pravastatin (PRAVACHOL) 80 MG tablet TAKE 1 TABLET BY MOUTH AT BEDTIME  . Tiotropium Bromide Monohydrate (SPIRIVA RESPIMAT) 2.5 MCG/ACT AERS Inhale 2 puffs into the lungs daily.   No facility-administered encounter medications on file as of 03/17/2019.     ALLERGIES: Allergies  Allergen Reactions  . Ace Inhibitors Other (See Comments)    Hyperkalemia--07/23/2013:patient states not familiar with the following allergy    VACCINATION STATUS: Immunization History  Administered Date(s) Administered  . Pneumococcal Conjugate-13 10/23/2013  . Pneumococcal Polysaccharide-23 03/02/2002  . Td 06/02/2004    Diabetes He presents for his initial diabetic visit. He has type 2 diabetes mellitus. His disease course has been worsening. There  are no hypoglycemic associated symptoms. Pertinent negatives for hypoglycemia include no confusion, headaches, pallor or seizures. Associated symptoms include blurred vision, polydipsia and polyuria. Pertinent negatives for diabetes include no chest pain, no fatigue, no polyphagia and no weakness. There are no hypoglycemic complications. Symptoms are worsening. Risk factors for coronary artery disease include dyslipidemia, diabetes mellitus, hypertension,  male sex, sedentary lifestyle, tobacco exposure and obesity. Current diabetic treatment includes insulin injections. His weight is decreasing steadily. He is following a generally unhealthy diet. When asked about meal planning, he reported none. He has not had a previous visit with a dietitian. His breakfast blood glucose range is generally 140-180 mg/dl. His lunch blood glucose range is generally >200 mg/dl. His dinner blood glucose range is generally >200 mg/dl. His bedtime blood glucose range is generally >200 mg/dl. His overall blood glucose range is >200 mg/dl. (He presents his logs showing controlled fasting glycemic profile, significantly above target postprandial blood glucose readings.  His most recent A1c was 10.3%.   )  Hyperlipidemia This is a chronic problem. The current episode started more than 1 year ago. Associated symptoms include shortness of breath. Pertinent negatives include no chest pain or myalgias. Current antihyperlipidemic treatment includes statins. Risk factors for coronary artery disease include diabetes mellitus, dyslipidemia, male sex and a sedentary lifestyle.    Review of Systems  Constitutional: Negative for chills, fatigue, fever and unexpected weight change.  HENT: Negative for dental problem, mouth sores and trouble swallowing.   Eyes: Positive for blurred vision. Negative for visual disturbance.  Respiratory: Positive for shortness of breath. Negative for cough, choking, chest tightness and wheezing.        He has  advanced COPD on multiple bronchodilators.  Patient uses oxygen portable oxygen canister at home.  Cardiovascular: Negative for chest pain, palpitations and leg swelling.  Gastrointestinal: Negative for abdominal distention, abdominal pain, constipation, diarrhea, nausea and vomiting.  Endocrine: Positive for polydipsia and polyuria. Negative for polyphagia.  Genitourinary: Negative for dysuria, flank pain, hematuria and urgency.  Musculoskeletal: Negative for back pain, gait problem, myalgias and neck pain.  Skin: Negative for pallor, rash and wound.  Neurological: Negative for seizures, syncope, weakness, numbness and headaches.  Psychiatric/Behavioral: Negative.  Negative for confusion and dysphoric mood.    Objective:    BP (!) 178/84   Pulse 85   Ht 5\' 5"  (1.651 m)   Wt 175 lb 9.6 oz (79.7 kg)   SpO2 93%   BMI 29.22 kg/m   Wt Readings from Last 3 Encounters:  03/17/19 175 lb 9.6 oz (79.7 kg)  03/06/19 184 lb (83.5 kg)  02/10/19 185 lb (83.9 kg)     Physical Exam Constitutional:      General: He is not in acute distress.    Appearance: He is well-developed.  HENT:     Head: Normocephalic and atraumatic.  Neck:     Musculoskeletal: Normal range of motion and neck supple.     Thyroid: No thyromegaly.     Trachea: No tracheal deviation.  Cardiovascular:     Rate and Rhythm: Normal rate.     Pulses:          Dorsalis pedis pulses are 1+ on the right side and 1+ on the left side.       Posterior tibial pulses are 1+ on the right side and 1+ on the left side.     Heart sounds: Normal heart sounds, S1 normal and S2 normal. No murmur. No gallop.   Pulmonary:     Effort: Pulmonary effort is normal. No respiratory distress.     Breath sounds: No wheezing.  Abdominal:     General: There is no distension.     Tenderness: There is no abdominal tenderness. There is no guarding.  Musculoskeletal:     Right shoulder: He exhibits no swelling and no deformity.  Skin:    General:  Skin is warm and dry.     Findings: No rash.     Nails: There is no clubbing.   Neurological:     Mental Status: He is alert and oriented to person, place, and time.     Cranial Nerves: No cranial nerve deficit.     Sensory: No sensory deficit.     Gait: Gait normal.     Deep Tendon Reflexes: Reflexes are normal and symmetric.     Comments: Elderly patient.  Hard of hearing.  Psychiatric:        Speech: Speech normal.        Behavior: Behavior is cooperative.      CMP ( most recent) CMP     Component Value Date/Time   NA 138 02/10/2019 0916   K 4.1 02/10/2019 0916   CL 97 (L) 02/10/2019 0916   CO2 34 (H) 02/10/2019 0916   GLUCOSE 119 (H) 02/10/2019 0916   BUN 25 02/10/2019 0916   CREATININE 0.95 02/10/2019 0916   CALCIUM 9.0 02/10/2019 0916   PROT 5.8 (L) 02/10/2019 0916   ALBUMIN 2.1 (L) 11/01/2018 0452   AST 14 02/10/2019 0916   ALT 9 02/10/2019 0916   ALKPHOS 57 11/01/2018 0452   BILITOT 0.2 02/10/2019 0916   GFRNONAA 75 02/10/2019 0916   GFRAA 87 02/10/2019 0916     Diabetic Labs (most recent): Lab Results  Component Value Date   HGBA1C 10.3 (H) 02/10/2019   HGBA1C 8.1 (H) 09/09/2018   HGBA1C 6.9 (H) 06/17/2018     Lipid Panel ( most recent) Lipid Panel     Component Value Date/Time   CHOL 225 (H) 02/10/2019 0916   TRIG 238 (H) 02/10/2019 0916   HDL 56 02/10/2019 0916   CHOLHDL 4.0 02/10/2019 0916   VLDL 25 09/26/2017 0655   LDLCALC 131 (H) 02/10/2019 0916      Lab Results  Component Value Date   TSH 1.010 02/17/2018   TSH 3.123 09/25/2017   TSH 2.757 02/26/2017   FREET4 0.57 (L) 02/17/2018      Assessment & Plan:   1. DM type 2 causing vascular disease ([place]) - [place] has currently uncontrolled symptomatic type 2 DM, (does not recall exact duration of his diabetes believes he had it for whole of his life),  with most recent A1c of 10.3 %. Recent labs reviewed.  -his diabetes is complicated by COPD/history of smoking , lack of  social support, and he remains at a high risk for more acute and chronic complications which include CAD, CVA, CKD, retinopathy, and neuropathy. These are all discussed in detail with him.  - I have counseled him on diet management  by adopting a carbohydrate restricted/protein rich diet. - he  admits there is a room for improvement in his diet and drink choices. -  Suggestion is made for him to avoid simple carbohydrates  from his diet including Cakes, Sweet Desserts / Pastries, Ice Cream, Soda (diet and regular), Sweet Tea, Candies, Chips, Cookies, Sweet Pastries,  Store Bought Juices, Alcohol in Excess of  1-2 drinks a day, Artificial Sweeteners, Coffee Creamer, and "Sugar-free" Products. This will help patient to have stable blood glucose profile and potentially avoid unintended weight gain.  - I encouraged him to switch to  unprocessed or minimally processed complex starch and increased protein intake (animal or plant source), fruits, and vegetables.  - he is advised to stick to a routine mealtimes to eat 3  meals  a day and avoid unnecessary snacks ( to snack only to correct hypoglycemia).   - he will be scheduled with Jearld Fenton, RDN, CDE for individualized diabetes education.  - I have approached him with the following individualized plan to manage diabetes and patient agrees:   -He is high risk to give multiple daily injections of insulin.  Priority in his care would be to avoid hypoglycemia.  A1c target for him would be between 7.5 and 8%.  Adding prandial insulin will be overwhelming for him. -He presents with postprandial hyperglycemia.  He is advised to continue Basaglar 30 units nightly, continue monitoring blood glucose 2 times a day-before breakfast and at bedtime.   -I discussed and added metformin 500 mg p.o. daily after breakfast and after supper.  - he is encouraged to call clinic for blood glucose levels less than 70 or above 300 mg /dl.   - Patient specific target  A1c;   LDL, HDL, Triglycerides, and  Waist Circumference were discussed in detail.  2) Blood Pressure /Hypertension:  his blood pressure is uncontrolled to target.   he is advised to continue his current medications including Toprol 25 mg p.o. daily with breakfast . 3) Lipids/Hyperlipidemia:   Review of his recent lipid panel showed un controlled  LDL at 131 .  he  is advised to continue    pravastatin 80 mg daily at bedtime.  Side effects and precautions discussed with him.  4)  Weight/Diet:  Body mass index is 29.22 kg/m.  -He lost 10 pounds since last visit.   I discussed with him the fact that loss of 5 - 10% of his  current body weight will have the most impact on his diabetes management.  CDE Consult will be initiated . Exercise, and detailed carbohydrates information provided  -  detailed on discharge instructions.  5) Chronic Care/Health Maintenance:  -he  is on  Statin medications and  is encouraged to initiate and continue to follow up with Ophthalmology, Dentist,  Podiatrist at least yearly or according to recommendations, and advised to  stay away from smoking. I have recommended yearly flu vaccine and pneumonia vaccine at least every 5 years; moderate intensity exercise for up to 150 minutes weekly; and  sleep for at least 7 hours a day.  - he is  advised to maintain close follow up with Susy Frizzle, MD for primary care needs, as well as his other providers for optimal and coordinated care.  - Patient Care Time Today:  25 min, of which >50% was spent in reviewing his  current and  previous labs/studies, his blood glucose readings, previous treatments, and medications doses and developing a plan for long-term care based on the latest recommendations for standards of care.  [place] participated in the discussions, expressed understanding, and voiced agreement with the above plans.  All questions were answered to his satisfaction. he is encouraged to contact clinic should he have any  questions or concerns prior to his return visit.   Follow up plan: - Return in about 4 months (around 07/17/2019) for Follow up with Pre-visit Labs, Meter, and Logs.  Glade Lloyd, MD [facility] [address] [place], [place] [address] Phone: [phone]  Fax: [phone]    03/17/2019, 4:04 PM  This note was partially dictated with voice recognition software. Similar sounding words can be transcribed inadequately or may not  be corrected upon review.

## 2019-03-18 ENCOUNTER — Other Ambulatory Visit: Payer: Self-pay

## 2019-03-18 NOTE — Patient Outreach (Signed)
[facility]) Care Management  03/18/2019   Edward [name] Mar 24, 1935 [id number]  Subjective: Successful outreach to the patient.  Two patient identifiers obtained.  The patient states that he has been doing great.  He denies and pain, shortness of breath, wheezing, cough, or falls.  He states he does not have his Spiriva inhaler.  Advised the patient that I will contact [place] and have them call to help him. He verbalized understanding.  Other than his Spiriva inhaler he states that he has all his medications and taking them.  He is about to run out of his insulin and I advised him to call his physician office to get refills before he runs out.  He stated he would.  The patient has a follow up appointment with Dr Dorris Fetch in October.     Current Medications:  Current Outpatient Medications  Medication Sig Dispense Refill  . albuterol (PROVENTIL) (2.5 MG/3ML) 0.083% nebulizer solution INHALE 1 VIAL VIA NEBULIZER EVERY 6 HOURS AS NEEDED FOR WHEEZING OR SHORTNESS OF BREATH 360 mL 4  . apixaban (ELIQUIS) 2.5 MG TABS tablet Take 1 tablet (2.5 mg total) by mouth 2 (two) times daily. Restart on 7/24 120 tablet 0  . aspirin EC 81 MG tablet Take 1 tablet (81 mg total) by mouth daily. Resume on 7/24    . cloNIDine (CATAPRES) 0.1 MG tablet TAKE 1 TABLET BY MOUTH 2 TIMES A DAY 60 tablet 3  . diltiazem (CARDIZEM CD) 240 MG 24 hr capsule TAKE 1 CAPSULE BY MOUTH DAILY 90 capsule 4  . furosemide (LASIX) 40 MG tablet Take 1 tablet (40 mg total) by mouth daily. 90 tablet 3  . glucose blood (ONE TOUCH ULTRA TEST) test strip CHECK FASTING BLOOD SUGAR TWICE DAILY 100 each 4  . Insulin Glargine (BASAGLAR KWIKPEN) 100 UNIT/ML SOPN Inject 0.3 mLs (30 Units total) into the skin at bedtime.    Marland Kitchen ipratropium (ATROVENT) 0.02 % nebulizer solution Inhale 3 mLs into the lungs every 6 (six) hours as needed.    . Lancets (ONETOUCH DELICA PLUS [id number]) MISC USE TO CHECK BLOOD SUGAR TWICE DAILY AS DIRECTED  100 each 2  . metFORMIN (GLUCOPHAGE) 500 MG tablet Take 1 tablet (500 mg total) by mouth 2 (two) times daily with a meal. 60 tablet 2  . metoprolol tartrate (LOPRESSOR) 25 MG tablet TAKE 1 TABLET BY MOUTH TWICE DAILY 60 tablet 3  . mometasone-formoterol (DULERA) 200-5 MCG/ACT AERO Inhale 2 puffs into the lungs 2 (two) times daily. 13 g 5  . OXYGEN Inhale 3 L into the lungs continuous.     . polyethylene glycol (MIRALAX / GLYCOLAX) packet Take 17 g by mouth daily as needed for mild constipation or moderate constipation.    . potassium chloride SA (K-DUR,KLOR-CON) 20 MEQ tablet Take 1 tablet (20 mEq total) by mouth daily. When he takes lasix 30 tablet 3  . pravastatin (PRAVACHOL) 80 MG tablet TAKE 1 TABLET BY MOUTH AT BEDTIME 90 tablet 4  . Tiotropium Bromide Monohydrate (SPIRIVA RESPIMAT) 2.5 MCG/ACT AERS Inhale 2 puffs into the lungs daily. 1 Inhaler 5   No current facility-administered medications for this visit.     Functional Status:  In your present state of health, do you have any difficulty performing the following activities: 11/14/2018 10/30/2018  Hearing? N N  Vision? N N  Comment - -  Difficulty concentrating or making decisions? N N  Walking or climbing stairs? N N  Dressing or bathing? N N  Doing errands, shopping? Y Y  Preparing Food and eating ? - -  Using the Toilet? - -  In the past six months, have you accidently leaked urine? - -  Do you have problems with loss of bowel control? - -  Managing your Medications? - -  Managing your Finances? - -  Housekeeping or managing your Housekeeping? - -  Some recent data might be hidden    Fall/Depression Screening: Fall Risk  03/18/2019 03/06/2019 12/16/2018  Falls in the past year? 0 0 0  Comment - - -  Number falls in past yr: - - -  Injury with Fall? - - -  Risk for fall due to : - - -  Follow up - Falls evaluation completed -   PHQ 2/9 Scores 11/14/2018 10/25/2018 08/13/2018 08/08/2018 05/23/2018 09/12/2017 08/15/2017  PHQ -  2 Score 0 0 0 0 0 0 0  PHQ- 9 Score - - - - - 0 0   Assessment: Patient will continue to benefit from health coach outreach for disease management and support. THN CM Care Plan Problem One     Most Recent Value  THN Long Term Goal   In 90 days will be able to verbalize no copd flares or hospital admissions.  THN Long Term Goal Start Date  03/18/19  Interventions for Problem One Long Term Goal  Reviewed signs and symptoms of COPD,  encouraged medication adherence, diet and keeping appointments      Plan: RN Health Coach will contact patient in the month of September and patient agrees to next outreach. Email sent to [facility] and Etter Sjogren in [place] about Spiriva inhaler.  Lazaro Arms RN, BSN, [place] Direct Dial:  [phone]  Fax: [phone]

## 2019-03-19 ENCOUNTER — Other Ambulatory Visit: Payer: Self-pay | Admitting: Pharmacy Technician

## 2019-03-19 NOTE — Patient Outreach (Signed)
[place] [facility]) Care Management  03/19/2019  Edward [name] [birth year] [id number]   Incoming message from [name] stating that she spoke to patient and he stated he was out of his Spiriva inhaler and did not have number to contact [facility] to receive more.  Outreach call made to [name] son Edward [name], HIPAA identifiers verified. Edward [name] states that he called company and has requested a refill of medication and that it would be here in the week or so. Per last encounter with Edward [name] he stated that he had called a refill into [facility] in April. When I asked Edward [name] about receiving the meds then, he stated he was unsure but he called it in and they would be here in the next week. I also asked Edward [name] if he had received the letter with the list of medication and pharmacy numbers that [name] had been approved for and he stated that "he might have" Suggested to Edward [name] that whenever [name] gets to about a 2 week supply of medication to call in his refills to companies and that they can always call me if they have any issues.  Follow up call placed to [facility] to confirm refill was called in to [facility] confirms that refill was requested on 6/16 and that previous order was sent out on 3/5.  [name] [place] Certified Pharmacy Technician [place] Management Direct Dial:[phone]

## 2019-03-26 NOTE — Progress Notes (Signed)
This encounter was created in error - please disregard.

## 2019-03-31 ENCOUNTER — Telehealth: Payer: Self-pay | Admitting: Family Medicine

## 2019-03-31 NOTE — Telephone Encounter (Signed)
Patient called in stating that he is having difficulty breathing that he says is chronic for him but he is also experiencing fatigue, and had some vomiting on yesterday. He is pretty adamant that he does not want to go to the ER. Please advise?

## 2019-03-31 NOTE — Telephone Encounter (Signed)
Tell pt we have no available appt today, he needs to go to ER based on his history They can also check him for COVID And get Chest xray

## 2019-03-31 NOTE — Telephone Encounter (Signed)
Called and informed patient to go to the ER. Patient verbalized understanding.

## 2019-04-02 DIAGNOSIS — J449 Chronic obstructive pulmonary disease, unspecified: Secondary | ICD-10-CM | POA: Diagnosis not present

## 2019-04-09 ENCOUNTER — Other Ambulatory Visit: Payer: Self-pay | Admitting: Pharmacy Technician

## 2019-04-09 ENCOUNTER — Other Ambulatory Visit: Payer: Self-pay | Admitting: Pharmacist

## 2019-04-09 NOTE — Patient Outreach (Signed)
[facility]) Care Management  04/09/2019  Dawn CHIDIEBERE WYNN 22-Sep-1935 [id number]                          Medication Assistance Referral  Referral From: In-basket message from [place] at Dr. Samella Parr office stating patient needs helping affording his Eliquis  Medication/Company: Eliquis / [place]  Will route note to [place] for medication assessment.  [name] [name] Certified Pharmacy Technician [place] Management Direct Dial:[phone]

## 2019-04-09 NOTE — Patient Outreach (Signed)
[facility]) Care Management  Edward [name]   04/09/2019  Edward [name] March 25, 1935 [id number]  Reason for referral: Medication Assistance with Eliquis   Referral source: Dr. Dennard Schaumann Current insurance: [facility]  PMHx includes but not limited to:  Atrial fibrillation, not taking anticoagulation due to cost, COPD, T2DM, HTN, HLD, pulmonary fibrosis, IDA  Outreach:  Successful telephone call with Edward [name] and his son.  HIPAA identifiers verified.   Subjective:  Son, Edward [name], reports patient uses both [employer] and [employer] which mails patient his medications.  He does not know where patient's most recent EOB is to confirm what he has spent in co-pays this year.  He states patient has been out of Eliquis for at least a month.   Objective: The ASCVD Risk score [name] DC Jr., et al., 2013) failed to calculate for the following reasons:   The 2013 ASCVD risk score is only valid for ages 21 to 39   The patient has a prior MI or stroke diagnosis  Lab Results  Component Value Date   CREATININE 0.95 02/10/2019   CREATININE 1.07 11/11/2018   CREATININE 1.22 11/01/2018    Lab Results  Component Value Date   HGBA1C 10.3 (H) 02/10/2019    Lipid Panel     Component Value Date/Time   CHOL 225 (H) 02/10/2019 0916   TRIG 238 (H) 02/10/2019 0916   HDL 56 02/10/2019 0916   CHOLHDL 4.0 02/10/2019 0916   VLDL 25 09/26/2017 0655   LDLCALC 131 (H) 02/10/2019 0916    BP Readings from Last 3 Encounters:  03/17/19 (!) 178/84  03/06/19 (!) 154/78  02/10/19 120/60    Allergies  Allergen Reactions  . Ace Inhibitors Other (See Comments)    Hyperkalemia--07/23/2013:patient states not familiar with the following allergy    Medications Reviewed Today    Reviewed by Cassandria Anger, MD (Physician) on 03/17/19 at [place] List Status: <None>  Medication Order Taking? Sig Documenting Provider Last Dose Status Informant  albuterol  (PROVENTIL) (2.5 MG/3ML) 0.083% nebulizer solution [id number] No INHALE 1 VIAL VIA NEBULIZER EVERY 6 HOURS AS NEEDED FOR WHEEZING OR SHORTNESS OF BREATH Susy Frizzle, MD Taking Active   apixaban (ELIQUIS) 2.5 MG TABS tablet [id number] No Take 1 tablet (2.5 mg total) by mouth 2 (two) times daily. Restart on 7/24 Kathie Dike, MD Taking Active Self           Med Note (MENDENHALL, [name]   Tue Sep 10, 2018 10:32 AM) He gets this medication from the drug company patient assistance program.   aspirin EC 81 MG tablet [id number] No Take 1 tablet (81 mg total) by mouth daily. Resume on 7/24 Kathie Dike, MD Taking Active Self  cloNIDine (CATAPRES) 0.1 MG tablet [id number] No TAKE 1 TABLET BY MOUTH 2 TIMES A DAY Susy Frizzle, MD Taking Active   diltiazem (CARDIZEM CD) 240 MG 24 hr capsule [id number] No TAKE 1 CAPSULE BY MOUTH DAILY Susy Frizzle, MD Taking Active Self  furosemide (LASIX) 40 MG tablet [id number] No Take 1 tablet (40 mg total) by mouth daily. Susy Frizzle, MD Taking Active Self  glucose blood (ONE TOUCH ULTRA TEST) test strip [id number] No CHECK FASTING BLOOD SUGAR TWICE DAILY Susy Frizzle, MD Taking Active Self  Insulin Glargine (BASAGLAR KWIKPEN) 100 UNIT/ML SOPN [id number]  Inject 0.3 mLs (30 Units total) into the skin at bedtime. Cassandria Anger, MD  Active   ipratropium (  ATROVENT) 0.02 % nebulizer solution [id number] No Inhale 3 mLs into the lungs every 6 (six) hours as needed. [provider] Taking Active Self  Lancets [name] DELICA PLUS [id number]) [place] [id number] No USE TO CHECK BLOOD SUGAR TWICE DAILY AS DIRECTED Susy Frizzle, MD Taking Active   metFORMIN (GLUCOPHAGE) 500 MG tablet [id number]  Take 1 tablet (500 mg total) by mouth 2 (two) times daily with a meal. [name], [name], MD  Active   metoprolol tartrate (LOPRESSOR) 25 MG tablet [id number] No TAKE 1 TABLET BY MOUTH TWICE DAILY Susy Frizzle, MD Taking Active    mometasone-formoterol [facility]) 200-5 MCG/ACT AERO [id number] No Inhale 2 puffs into the lungs 2 (two) times daily. Susy Frizzle, MD Taking Active   OXYGEN [id number] No Inhale 3 L into the lungs continuous.  [provider] Taking Active Self  polyethylene glycol (MIRALAX / GLYCOLAX) packet [id number] No Take 17 g by mouth daily as needed for mild constipation or moderate constipation. [name], MD Taking Active Self  potassium chloride SA (K-DUR,KLOR-CON) 20 MEQ tablet [id number] No Take 1 tablet (20 mEq total) by mouth daily. When he takes lasix [name], MD Taking Active Self  pravastatin (PRAVACHOL) 80 MG tablet [id number] No TAKE 1 TABLET BY MOUTH AT BEDTIME Susy Frizzle, MD Taking Active Self  Tiotropium Bromide Monohydrate (SPIRIVA RESPIMAT) 2.5 MCG/ACT AERS [id number] No Inhale 2 puffs into the lungs daily. Susy Frizzle, MD Taking Active   Med List Note [name] 09/22/17 2323): Patient states that his pharmacy is [facility]          Assessment: -Very poor understanding of medication regimen and health conditions -Very low interest in engaging with [place]  -Medication non-compliance with anticoagulation due to cost  Calls placed to both of patient's pharmacies.  Neither pharmacy has record of active Eliquis RX on file or any recent fills in 2020.    Per notes, patient has [employer] Extra Help which provides 15% co-insurance or co-pay of medication, whichever is lower.  In this case, the lower cost is the co-pay of $47.   Patient states he cannot afford this.    Patient not eligible for patient assistance program for Eliquis as he has not met out-of-pocket criteria (3% household income).   Plan: -Message left with Dr. Samella Parr office to discuss patient's anticoagulation  Ralene Bathe, PharmD, [place] [phone]

## 2019-04-10 NOTE — Progress Notes (Signed)
Pt has been scheduled for an appointment

## 2019-04-11 ENCOUNTER — Ambulatory Visit: Payer: Self-pay | Admitting: Pharmacist

## 2019-04-14 ENCOUNTER — Ambulatory Visit: Payer: Self-pay | Admitting: Pharmacist

## 2019-04-15 ENCOUNTER — Other Ambulatory Visit: Payer: Self-pay

## 2019-04-15 ENCOUNTER — Ambulatory Visit: Payer: Self-pay | Admitting: Pharmacist

## 2019-04-15 ENCOUNTER — Other Ambulatory Visit: Payer: Self-pay | Admitting: Pharmacist

## 2019-04-15 ENCOUNTER — Ambulatory Visit: Payer: Medicare Other | Admitting: Family Medicine

## 2019-04-15 ENCOUNTER — Ambulatory Visit (INDEPENDENT_AMBULATORY_CARE_PROVIDER_SITE_OTHER): Payer: Medicare Other | Admitting: Family Medicine

## 2019-04-15 VITALS — BP 130/58 | HR 84 | Temp 98.1°F | Resp 20 | Ht 65.0 in | Wt 177.0 lb

## 2019-04-15 DIAGNOSIS — J441 Chronic obstructive pulmonary disease with (acute) exacerbation: Secondary | ICD-10-CM

## 2019-04-15 DIAGNOSIS — I480 Paroxysmal atrial fibrillation: Secondary | ICD-10-CM | POA: Diagnosis not present

## 2019-04-15 MED ORDER — PREDNISONE 20 MG PO TABS: ORAL_TABLET | ORAL | 0 refills | Status: DC

## 2019-04-15 MED ORDER — APIXABAN 5 MG PO TABS: 5.0000 mg | ORAL_TABLET | Freq: Two times a day (BID) | ORAL | 5 refills | Status: DC

## 2019-04-15 NOTE — Progress Notes (Signed)
Subjective:    Patient ID: Edward [name], male    DOB: 1935-05-06, 84 y.o.   MRN: [id number]  HPI  We have recently been made aware that the patient was not taking Eliquis.  Patient states he is not sure when he stopped it.  Apparently he was receiving free samples from a pharmacist assistant who is going to his home conducting medication reviews.  However this person stopped many months ago.  He has not been taking the medication since.  He has been taking a baby aspirin.  Therefore I asked the patient to come in today to discuss atrial fibrillation and to review the risk with him so that he can make an educated decision regarding what he wants to do.  Patient states that he could not afford Eliquis.  He believes that the last price he received for this was $45.  I explained to the patient that he had 3 options.  He can continue his current plan of taking aspirin daily and be at an elevated risk for stroke.  Patient's CHADSVaSC score is 5.  I explained to the patient that he could resume Eliquis and pay the higher price although this medication does not require any blood work to monitor and is safer at preventing strokes without high risk of bleeding.  His third option would be to switch to Coumadin which would be much cheaper however require strict compliance.  I explained to the patient that the medication can cause unintended injury and even death if not taken properly.  It would also require him to take his medication consistently and to come in for an INR weekly until therapeutic and then every 4 weeks thereafter. Past Medical History:  Diagnosis Date  . Allergy    Rhinitis  . Atrial fibrillation ([place])   . Bronchitis   . Chronic respiratory failure ([place])   . Colon polyps   . COPD (chronic obstructive pulmonary disease) ([place])   . Diabetes mellitus   . Elevated lipids   . Hypercholesterolemia   . Hypertension   . Iron deficiency anemia due to chronic blood loss 04/30/2018  . Noncompliance    . On home O2    2L N/C   . PSA elevation   . Pulmonary fibrosis ([place])   . Vitamin D deficiency    Past Surgical History:  Procedure Laterality Date  . BIOPSY  04/23/2018   Procedure: BIOPSY;  Surgeon: Danie Binder, MD;  Location: [facility];  Service: Endoscopy;;  duodenum gastric  . CATARACT EXTRACTION W/PHACO  06/25/2012   Procedure: CATARACT EXTRACTION PHACO AND INTRAOCULAR LENS PLACEMENT (IOC);  Surgeon: Elta Guadeloupe T. Gershon Crane, MD;  Location: [facility] ORS;  Service: Ophthalmology;  Laterality: Left;  CDE=19.01  . CATARACT EXTRACTION W/PHACO  07/09/2012   Procedure: CATARACT EXTRACTION PHACO AND INTRAOCULAR LENS PLACEMENT (IOC);  Surgeon: Elta Guadeloupe T. Gershon Crane, MD;  Location: [facility];  Service: Ophthalmology;  Laterality: Right;  CDE: 20.09  . COLONOSCOPY WITH PROPOFOL N/A 04/23/2018   Procedure: COLONOSCOPY WITH PROPOFOL;  Surgeon: Danie Binder, MD;  Location: [facility];  Service: Endoscopy;  Laterality: N/A;  . ESOPHAGOGASTRODUODENOSCOPY (EGD) WITH PROPOFOL N/A 04/23/2018   Procedure: ESOPHAGOGASTRODUODENOSCOPY (EGD) WITH PROPOFOL;  Surgeon: Danie Binder, MD;  Location: [facility];  Service: Endoscopy;  Laterality: N/A;   Current Outpatient Medications on File Prior to Visit  Medication Sig Dispense Refill  . albuterol (PROVENTIL) (2.5 MG/3ML) 0.083% nebulizer solution INHALE 1 VIAL VIA NEBULIZER EVERY 6 HOURS AS  NEEDED FOR WHEEZING OR SHORTNESS OF BREATH 360 mL 4  . aspirin EC 81 MG tablet Take 1 tablet (81 mg total) by mouth daily. Resume on 7/24    . cloNIDine (CATAPRES) 0.1 MG tablet TAKE 1 TABLET BY MOUTH 2 TIMES A DAY 60 tablet 3  . diltiazem (CARDIZEM CD) 240 MG 24 hr capsule TAKE 1 CAPSULE BY MOUTH DAILY 90 capsule 4  . furosemide (LASIX) 40 MG tablet Take 1 tablet (40 mg total) by mouth daily. 90 tablet 3  . glucose blood (ONE TOUCH ULTRA TEST) test strip CHECK FASTING BLOOD SUGAR TWICE DAILY 100 each 4  . Insulin Glargine (BASAGLAR KWIKPEN) 100 UNIT/ML SOPN Inject 0.3 mLs (30  Units total) into the skin at bedtime.    Marland Kitchen ipratropium (ATROVENT) 0.02 % nebulizer solution Inhale 3 mLs into the lungs every 6 (six) hours as needed.    . Lancets (ONETOUCH DELICA PLUS [id number]) MISC USE TO CHECK BLOOD SUGAR TWICE DAILY AS DIRECTED 100 each 2  . metFORMIN (GLUCOPHAGE) 500 MG tablet Take 1 tablet (500 mg total) by mouth 2 (two) times daily with a meal. 60 tablet 2  . metoprolol tartrate (LOPRESSOR) 25 MG tablet TAKE 1 TABLET BY MOUTH TWICE DAILY 60 tablet 3  . mometasone-formoterol (DULERA) 200-5 MCG/ACT AERO Inhale 2 puffs into the lungs 2 (two) times daily. 13 g 5  . OXYGEN Inhale 3 L into the lungs continuous.     . polyethylene glycol (MIRALAX / GLYCOLAX) packet Take 17 g by mouth daily as needed for mild constipation or moderate constipation.    . potassium chloride SA (K-DUR,KLOR-CON) 20 MEQ tablet Take 1 tablet (20 mEq total) by mouth daily. When he takes lasix 30 tablet 3  . pravastatin (PRAVACHOL) 80 MG tablet TAKE 1 TABLET BY MOUTH AT BEDTIME 90 tablet 4  . Tiotropium Bromide Monohydrate (SPIRIVA RESPIMAT) 2.5 MCG/ACT AERS Inhale 2 puffs into the lungs daily. 1 Inhaler 5   No current facility-administered medications on file prior to visit.    Allergies  Allergen Reactions  . Ace Inhibitors Other (See Comments)    Hyperkalemia--07/23/2013:patient states not familiar with the following allergy   Social History   Socioeconomic History  . Marital status: Married    Spouse name: Not on file  . Number of children: Not on file  . Years of education: Not on file  . Highest education level: Not on file  Occupational History  . 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 Financial resource strain: Not on file  . Food insecurity    Worry: Not on file    Inability: Not on file  . Transportation needs    Medical: Not on file    Non-medical: Not on file  Tobacco Use  . Smoking status: Former Smoker    Packs/day: 1.50    Years: 60.00     Pack years: 90.00    Types: Cigarettes    Quit date: 12/31/2012    Years since quitting: 6.2  . Smokeless tobacco: Never Used  Substance and Sexual Activity  . Alcohol use: No  . Drug use: No  . Sexual activity: Yes    Birth control/protection: None  Lifestyle  . Physical activity    Days per week: Not on file    Minutes per session: Not on file  . Stress: Not on file  Relationships  . Social Herbalist on phone: Not on file    Gets  together: Not on file    Attends religious service: Not on file    Active member of club or organization: Not on file    Attends meetings of clubs or organizations: Not on file    Relationship status: Not on file  . Intimate partner violence    Fear of current or ex partner: Not on file    Emotionally abused: Not on file    Physically abused: Not on file    Forced sexual activity: Not on file  Other Topics Concern  . Not on file  Social History Narrative  . Not on file     Review of Systems  Respiratory: Positive for cough, shortness of breath and wheezing.   All other systems reviewed and are negative.      Objective:   Physical Exam Constitutional:      Appearance: He is obese.  Cardiovascular:     Rate and Rhythm: Normal rate and regular rhythm.  Pulmonary:     Effort: Pulmonary effort is normal.     Breath sounds: Wheezing present.  Abdominal:     General: Abdomen is flat.     Palpations: Abdomen is soft.  Musculoskeletal:     Right lower leg: No edema.     Left lower leg: No edema.  Neurological:     Mental Status: He is alert.           Assessment & Plan:  1. Paroxysmal atrial fibrillation ([name]) After discussing the situation with the patient he elects to continue Eliquis 5 mg twice daily.  I do not believe the patient would be a good candidate for Coumadin.  He is noncompliant with medication.  He fails to follow-up.  He also randomly takes his medication.  I believe that it could actually be more dangerous  for him to take Coumadin then to take a 325 mg aspirin.  Therefore if he is unable to afford Eliquis I would switch the patient to 325 mg aspirin.  I do not believe that I can safely regulate his PT/INR on Coumadin given his medical history and his noncompliance. 2. COPD with acute exacerbation ([place]) Patient reports increased wheezing and coughing and chest congestion.  On examination he has diminished breath sounds bilaterally and wheezing.  I will start the patient on a prednisone taper pack.  He denies any fevers or chills or purulent sputum.

## 2019-04-15 NOTE — Patient Outreach (Signed)
[name]) Care Management [place]  04/15/2019  Chaitanya GEARL BARATTA [birth year] [id number]  Reason for referral: medication assistance  Per review of notes, patient has o/v with PCP today to discuss anticoagulation.  Patient unable to afford Eliquis and has not picked up medication in several months.  PCP notes challenges with possible substitution to coumadin due to patient non-compliance and notes preference for patient to change to ASA 325mg  daily if unable to refill Eliquis.    Plan: -Will close [facility] pharmacy case as patient not eligible for Eliquis patient assistance program -Am happy to assist in the future as needed  Ralene Bathe, PharmD, [place] [phone]

## 2019-05-03 DIAGNOSIS — J449 Chronic obstructive pulmonary disease, unspecified: Secondary | ICD-10-CM | POA: Diagnosis not present

## 2019-05-15 ENCOUNTER — Other Ambulatory Visit: Payer: Self-pay | Admitting: Family Medicine

## 2019-05-16 ENCOUNTER — Other Ambulatory Visit: Payer: Self-pay

## 2019-05-19 ENCOUNTER — Other Ambulatory Visit: Payer: Self-pay

## 2019-05-19 ENCOUNTER — Ambulatory Visit (INDEPENDENT_AMBULATORY_CARE_PROVIDER_SITE_OTHER): Payer: Medicare Other | Admitting: Family Medicine

## 2019-05-19 ENCOUNTER — Encounter: Payer: Self-pay | Admitting: Family Medicine

## 2019-05-19 VITALS — BP 118/62 | HR 66 | Temp 98.6°F | Resp 18 | Ht 65.0 in | Wt 174.0 lb

## 2019-05-19 DIAGNOSIS — E1165 Type 2 diabetes mellitus with hyperglycemia: Secondary | ICD-10-CM | POA: Diagnosis not present

## 2019-05-19 DIAGNOSIS — N183 Chronic kidney disease, stage 3 unspecified: Secondary | ICD-10-CM

## 2019-05-19 DIAGNOSIS — I480 Paroxysmal atrial fibrillation: Secondary | ICD-10-CM | POA: Diagnosis not present

## 2019-05-19 MED ORDER — LORAZEPAM 0.5 MG PO TABS: 0.5000 mg | ORAL_TABLET | Freq: Every evening | ORAL | 1 refills | Status: DC | PRN

## 2019-05-19 NOTE — Progress Notes (Signed)
Subjective:    Patient ID: Edward [name], male    DOB: 1934/10/21, 84 y.o.   MRN: [id number]  Medication Refill Associated symptoms include coughing.   04/15/19 We have recently been made aware that the patient was not taking Eliquis.  Patient states Edward [name] is not sure when Edward [name] stopped it.  Apparently Edward [name] was receiving free samples from a pharmacist assistant who is going to his home conducting medication reviews.  However this person stopped many months ago.  Edward [name] has not been taking the medication since.  Edward [name] has been taking a baby aspirin.  Therefore I asked the patient to come in today to discuss atrial fibrillation and to review the risk with him so that Edward [name] can make an educated decision regarding what Edward [name] wants to do.  Patient states that Edward [name] could not afford Eliquis.  Edward [name] believes that the last price Edward [name] received for this was $45.  I explained to the patient that Edward [name] had 3 options.  Edward [name] can continue his current plan of taking aspirin daily and be at an elevated risk for stroke.  Patient's CHADSVaSC score is 5.  I explained to the patient that Edward [name] could resume Eliquis and pay the higher price although this medication does not require any blood work to monitor and is safer at preventing strokes without high risk of bleeding.  His third option would be to switch to Coumadin which would be much cheaper however require strict compliance.  I explained to the patient that the medication can cause unintended injury and even death if not taken properly.  It would also require him to take his medication consistently and to come in for an INR weekly until therapeutic and then every 4 weeks thereafter.  At that time, my plan was: 1. Paroxysmal atrial fibrillation ([name]) After discussing the situation with the patient Edward [name] elects to continue Eliquis 5 mg twice daily.  I do not believe the patient would be a good candidate for Coumadin.  Edward [name] is noncompliant with medication.  Edward [name] fails to follow-up.  Edward [name] also randomly takes his  medication.  I believe that it could actually be more dangerous for him to take Coumadin then to take a 325 mg aspirin.  Therefore if Edward [name] is unable to afford Eliquis I would switch the patient to 325 mg aspirin.  I do not believe that I can safely regulate his PT/INR on Coumadin given his medical history and his noncompliance. 2. COPD with acute exacerbation ([place]) Patient reports increased wheezing and coughing and chest congestion.  On examination Edward [name] has diminished breath sounds bilaterally and wheezing.  I will start the patient on a prednisone taper pack.  Edward [name] denies any fevers or chills or purulent sputum.  05/19/19 Patient is requesting something to help him sleep.  Edward [name] states that Edward [name] falls asleep around 8:00.  Edward [name] wakes up at 930 and then is unable to sleep throughout the remainder of the night.  Edward [name] tosses and turns in bed.  Edward [name] denies any pain keeping him awake.  Edward [name] denies anxiety keeping him awake.  Edward [name] denies polyuria keeping him awake.  Otherwise Edward [name] denies any trouble breathing.  Edward [name] is audibly wheezing today on exam however Edward [name] states that Edward [name] believes his breathing is at his baseline.  Edward [name] states that Edward [name] is taking his Spiriva once a day and his Dulera twice a day as directed.  Edward [name] denies any swelling.  Edward [name] denies any tachycardia.  Edward [name] denies any chest pain.  Edward [name] is in  normal sinus rhythm today.  There is no peripheral edema.  I can appreciate no crackles or rails in his lungs.  Edward [name] states that Edward [name] is taking 30 units of insulin every day along with his metformin twice a day.  Edward [name] reports that his fasting blood sugars are in the 1 30-1 40 range however his 2-hour postprandial sugars are 200-300.  Edward [name] does state Edward [name] has been nauseated recently and has not had much appetite.  Edward [name] has lost 3 pounds since I last saw him Past Medical History:  Diagnosis Date   Allergy    Rhinitis   Atrial fibrillation ([name])    Bronchitis    Chronic respiratory failure ([name])    Colon polyps    COPD (chronic obstructive pulmonary  disease) ([name])    Diabetes mellitus    Elevated lipids    Hypercholesterolemia    Hypertension    Iron deficiency anemia due to chronic blood loss 04/30/2018   Noncompliance    On home O2    2L N/C    PSA elevation    Pulmonary fibrosis ([name])    Vitamin D deficiency    Past Surgical History:  Procedure Laterality Date   BIOPSY  04/23/2018   Procedure: BIOPSY;  Surgeon: Danie Binder, MD;  [facility];  Service: Endoscopy;;  duodenum gastric   CATARACT EXTRACTION W/PHACO  06/25/2012   Procedure: CATARACT EXTRACTION PHACO AND INTRAOCULAR LENS PLACEMENT ([name]);  Surgeon: Elta Guadeloupe T. Gershon Crane, MD;  Location: [facility];  Service: Ophthalmology;  Laterality: Left;  CDE=19.01   CATARACT EXTRACTION W/PHACO  07/09/2012   Procedure: CATARACT EXTRACTION PHACO AND INTRAOCULAR LENS PLACEMENT (IOC);  Surgeon: Elta Guadeloupe T. Gershon Crane, MD;  Location: [facility];  Service: Ophthalmology;  Laterality: Right;  CDE: 20.09   COLONOSCOPY WITH PROPOFOL N/A 04/23/2018   Procedure: COLONOSCOPY WITH PROPOFOL;  Surgeon: Danie Binder, MD;  [facility]: [facility];  Service: Endoscopy;  Laterality: N/A;   ESOPHAGOGASTRODUODENOSCOPY (EGD) WITH PROPOFOL N/A 04/23/2018   Procedure: ESOPHAGOGASTRODUODENOSCOPY (EGD) WITH PROPOFOL;  Surgeon: Danie Binder, MD;  Location: [facility];  Service: Endoscopy;  Laterality: N/A;   Current Outpatient Medications on File Prior to Visit  Medication Sig Dispense Refill   albuterol (PROVENTIL) (2.5 MG/3ML) 0.083% nebulizer solution INHALE 1 VIAL VIA NEBULIZER EVERY 6 HOURS AS NEEDED FOR WHEEZING OR SHORTNESS OF BREATH 360 mL 4   apixaban (ELIQUIS) 5 MG TABS tablet Take 1 tablet (5 mg total) by mouth 2 (two) times daily. 60 tablet 5   aspirin EC 81 MG tablet Take 1 tablet (81 mg total) by mouth daily. Resume on 7/24     cloNIDine (CATAPRES) 0.1 MG tablet TAKE 1 TABLET BY MOUTH 2 TIMES A DAY 60 tablet 3   diltiazem (CARDIZEM CD) 240 MG 24 hr capsule TAKE 1 CAPSULE BY  MOUTH DAILY 90 capsule 4   furosemide (LASIX) 40 MG tablet Take 1 tablet (40 mg total) by mouth daily. 90 tablet 3   glucose blood (ONE TOUCH ULTRA TEST) test strip CHECK FASTING BLOOD SUGAR TWICE DAILY 100 each 4   Insulin Glargine (BASAGLAR KWIKPEN) 100 UNIT/ML SOPN Inject 0.3 mLs (30 Units total) into the skin at bedtime.     ipratropium (ATROVENT) 0.02 % nebulizer solution INHALE THE CONTENTS OF 1 VIAL VIA NEBULIZATION 4 TIMES DAILY 75 mL 11   Lancets (ONETOUCH DELICA PLUS [id number]) MISC USE TO CHECK BLOOD SUGAR TWICE DAILY AS DIRECTED 100 each 2   metFORMIN (GLUCOPHAGE) 500 MG tablet  Take 1 tablet (500 mg total) by mouth 2 (two) times daily with a meal. 60 tablet 2   metoprolol tartrate (LOPRESSOR) 25 MG tablet TAKE 1 TABLET BY MOUTH TWICE DAILY 60 tablet 3   mometasone-formoterol (DULERA) 200-5 MCG/ACT AERO Inhale 2 puffs into the lungs 2 (two) times daily. 13 g 5   OXYGEN Inhale 3 L into the lungs continuous.      polyethylene glycol (MIRALAX / GLYCOLAX) packet Take 17 g by mouth daily as needed for mild constipation or moderate constipation.     potassium chloride SA (K-DUR,KLOR-CON) 20 MEQ tablet Take 1 tablet (20 mEq total) by mouth daily. When Edward [name] takes lasix 30 tablet 3   pravastatin (PRAVACHOL) 80 MG tablet TAKE 1 TABLET BY MOUTH AT BEDTIME 90 tablet 4   Tiotropium Bromide Monohydrate (SPIRIVA RESPIMAT) 2.5 MCG/ACT AERS Inhale 2 puffs into the lungs daily. 1 Inhaler 5   No current facility-administered medications on file prior to visit.    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, product/process development strain: Not on file   Food insecurity    Worry: Not on file    Inability: Not on Lexicographer needs    Medical: Not on file    Non-medical: Not on file  Tobacco Use   Smoking status: Former Smoker    Packs/day: 1.50    Years: 60.00    Pack years: 90.00    Types: Cigarettes    Quit date: 12/31/2012    Years since quitting: 6.3   Smokeless tobacco: Never Used  Substance and Sexual Activity   Alcohol use: No   Drug use: No   Sexual activity: Yes    Birth control/protection: None  Lifestyle   Physical activity    Days per week: Not on file    Minutes per session: Not on file   Stress: Not on file  Relationships   Social connections    Talks on phone: Not on file    Gets together: Not on file    Attends religious service: Not on file    Active member of club or organization: Not on file    Attends meetings of clubs or organizations: Not on file    Relationship status: Not on file   Intimate partner violence    Fear of current or ex partner: Not on file    Emotionally abused: Not on file    Physically abused: Not on file    Forced sexual activity: Not on file  Other Topics Concern   Not on file  Social History Narrative   Not on file     Review of Systems  Respiratory: Positive for cough, shortness of breath and wheezing.   All other systems reviewed and are negative.      Objective:   Physical Exam Constitutional:      Appearance: Edward [name] is obese.  Cardiovascular:     Rate and Rhythm: Normal rate and regular rhythm.  Pulmonary:     Effort: Pulmonary effort is normal.  Breath sounds: Wheezing present.  Abdominal:     General: Abdomen is flat.     Palpations: Abdomen is soft.  Musculoskeletal:     Right lower leg: No edema.     Left lower leg: No edema.  Neurological:     Mental Status: Edward [name] is alert.           Assessment & Plan:  The primary encounter diagnosis was Paroxysmal  atrial fibrillation ([place]). Diagnoses of CKD (chronic kidney disease), stage III ([place]) and Uncontrolled type 2 diabetes mellitus with hyperglycemia ([place]) were also pertinent to this visit. Patient is in normal sinus rhythm today.  Edward [name] is wheezing but Edward [name] is at his baseline.  Edward [name] denies any shortness of breath.  Edward [name] has not been tachycardia.  His rate is controlled and Edward [name] is appropriately anticoagulated on Eliquis.  I believe his nausea may be due to hyperglycemia.  I suspect his sugars are poorly controlled and worse than what Edward [name] is reporting.  Therefore I will check a hemoglobin A1c and if significantly elevated would suggest starting the patient on Jardiance given his history of heart failure.  I believe this would reduce his long-term morbidity and mortality risk.  I will give the patient a low-dose Ativan 0.5 mg p.o. nightly as needed insomnia and instructed him to use it sparingly.  Blood pressure today is well controlled.  Oxygen saturation is within normal range for this patient.  Edward [name] is afebrile.

## 2019-05-20 LAB — COMPLETE METABOLIC PANEL WITH GFR
AG Ratio: 1 (calc) (ref 1.0–2.5)
ALT: 8 U/L — ABNORMAL LOW (ref 9–46)
AST: 15 U/L (ref 10–35)
Albumin: 2.8 g/dL — ABNORMAL LOW (ref 3.6–5.1)
Alkaline phosphatase (APISO): 131 U/L (ref 35–144)
BUN: 14 mg/dL (ref 7–25)
CO2: 30 mmol/L (ref 20–32)
Calcium: 8.7 mg/dL (ref 8.6–10.3)
Chloride: 96 mmol/L — ABNORMAL LOW (ref 98–110)
Creat: 0.77 mg/dL (ref 0.70–1.11)
GFR, Est African American: 99 mL/min/{1.73_m2} (ref >=60)
GFR, Est Non African American: 85 mL/min/{1.73_m2} (ref >=60)
Globulin: 2.9 g/dL (calc) (ref 1.9–3.7)
Glucose, Bld: 113 mg/dL — ABNORMAL HIGH (ref 65–99)
Potassium: 4.5 mmol/L (ref 3.5–5.3)
Sodium: 134 mmol/L — ABNORMAL LOW (ref 135–146)
Total Bilirubin: 0.2 mg/dL (ref 0.2–1.2)
Total Protein: 5.7 g/dL — ABNORMAL LOW (ref 6.1–8.1)

## 2019-05-20 LAB — CBC WITH DIFFERENTIAL/PLATELET
Absolute Monocytes: 521 cells/uL (ref 200–950)
Basophils Absolute: 73 cells/uL (ref 0–200)
Basophils Relative: 1.1 %
Eosinophils Absolute: 442 cells/uL (ref 15–500)
Eosinophils Relative: 6.7 %
HCT: 36.4 % — ABNORMAL LOW (ref 38.5–50.0)
Hemoglobin: 12.1 g/dL — ABNORMAL LOW (ref 13.2–17.1)
Lymphs Abs: 2383 cells/uL (ref 850–3900)
MCH: 28.7 pg (ref 27.0–33.0)
MCHC: 33.2 g/dL (ref 32.0–36.0)
MCV: 86.3 fL (ref 80.0–100.0)
MPV: 10.4 fL (ref 7.5–12.5)
Monocytes Relative: 7.9 %
Neutro Abs: 3181 cells/uL (ref 1500–7800)
Neutrophils Relative %: 48.2 %
Platelets: 312 10*3/uL (ref 140–400)
RBC: 4.22 10*6/uL (ref 4.20–5.80)
RDW: 13.1 % (ref 11.0–15.0)
Total Lymphocyte: 36.1 %
WBC: 6.6 10*3/uL (ref 3.8–10.8)

## 2019-05-20 LAB — LIPID PANEL
Cholesterol: 175 mg/dL (ref <200)
HDL: 34 mg/dL — ABNORMAL LOW (ref >=40)
LDL Cholesterol (Calc): 99 mg/dL (calc)
Non-HDL Cholesterol (Calc): 141 mg/dL (calc) — ABNORMAL HIGH (ref <130)
Total CHOL/HDL Ratio: 5.1 (calc) — ABNORMAL HIGH (ref <5.0)
Triglycerides: 318 mg/dL — ABNORMAL HIGH (ref <150)

## 2019-05-20 LAB — HEMOGLOBIN A1C
Hgb A1c MFr Bld: 11.8 % of total Hgb — ABNORMAL HIGH (ref <5.7)
Mean Plasma Glucose: 292 (calc)
eAG (mmol/L): 16.2 (calc)

## 2019-05-27 ENCOUNTER — Other Ambulatory Visit: Payer: Self-pay | Admitting: "Endocrinology

## 2019-06-03 DIAGNOSIS — J449 Chronic obstructive pulmonary disease, unspecified: Secondary | ICD-10-CM | POA: Diagnosis not present

## 2019-06-18 ENCOUNTER — Other Ambulatory Visit: Payer: Self-pay

## 2019-06-18 NOTE — Patient Outreach (Signed)
[place] [facility]) Care Management  06/18/2019   Edward [name] [birth year] [id number]  Subjective: Successful outreach to the patient. Two patient identifiers given.  The patient states that he is doing fine.  He denies any shortness of breath, chest pain, weakness or falls.  He states that three weeks ago he did have a "spell of shortness of breath and weakness".  He states that he did not go to the doctors.  Advised the patient when he does not feel well he needs to call and notify his physician or make an appointment to be seen.  He verbalized "I know that".  He states that he is running out of his inhalers and his insulin.  He said he does not know how to reorder the inhalers nor where he should get his insulin.  Advised the patient that pharmacy technician Etter Sjogren had spoken with his son [name] previously on how to reorder medications.  He still does not remember nor his son.  RN Health Coach gave [name] phone number to get in touch with Caryl Pina to follow up on how to obtain refills.  He stated that he would call.  RN health Coach sent note to Etter Sjogren about medications and put in a referral to Pharmacy for medication adherence.    Current Medications:  Current Outpatient Medications  Medication Sig Dispense Refill  . albuterol (PROVENTIL) (2.5 MG/3ML) 0.083% nebulizer solution INHALE 1 VIAL VIA NEBULIZER EVERY 6 HOURS AS NEEDED FOR WHEEZING OR SHORTNESS OF BREATH 360 mL 4  . aspirin EC 81 MG tablet Take 1 tablet (81 mg total) by mouth daily. Resume on 7/24    . cloNIDine (CATAPRES) 0.1 MG tablet TAKE 1 TABLET BY MOUTH 2 TIMES A DAY 60 tablet 3  . diltiazem (CARDIZEM CD) 240 MG 24 hr capsule TAKE 1 CAPSULE BY MOUTH DAILY 90 capsule 4  . furosemide (LASIX) 40 MG tablet Take 1 tablet (40 mg total) by mouth daily. 90 tablet 3  . glucose blood (ONE TOUCH ULTRA TEST) test strip CHECK FASTING BLOOD SUGAR TWICE DAILY 100 each 4  . Insulin Glargine (BASAGLAR KWIKPEN) 100 UNIT/ML  SOPN Inject 0.3 mLs (30 Units total) into the skin at bedtime.    Marland Kitchen ipratropium (ATROVENT) 0.02 % nebulizer solution INHALE THE CONTENTS OF 1 VIAL VIA NEBULIZATION 4 TIMES DAILY 75 mL 11  . Lancets (ONETOUCH DELICA PLUS [id number]) MISC USE TO CHECK BLOOD SUGAR TWICE DAILY AS DIRECTED 100 each 2  . LORazepam (ATIVAN) 0.5 MG tablet Take 1 tablet (0.5 mg total) by mouth at bedtime as needed for sleep. 30 tablet 1  . metFORMIN (GLUCOPHAGE) 500 MG tablet TAKE 1 TABLET BY MOUTH TWICE DAILY WITH MEALS 180 tablet 0  . metoprolol tartrate (LOPRESSOR) 25 MG tablet TAKE 1 TABLET BY MOUTH TWICE DAILY 60 tablet 3  . mometasone-formoterol (DULERA) 200-5 MCG/ACT AERO Inhale 2 puffs into the lungs 2 (two) times daily. 13 g 5  . OXYGEN Inhale 3 L into the lungs continuous.     . polyethylene glycol (MIRALAX / GLYCOLAX) packet Take 17 g by mouth daily as needed for mild constipation or moderate constipation.    . potassium chloride SA (K-DUR,KLOR-CON) 20 MEQ tablet Take 1 tablet (20 mEq total) by mouth daily. When he takes lasix 30 tablet 3  . pravastatin (PRAVACHOL) 80 MG tablet TAKE 1 TABLET BY MOUTH AT BEDTIME 90 tablet 4  . Tiotropium Bromide Monohydrate (SPIRIVA RESPIMAT) 2.5 MCG/ACT AERS Inhale 2 puffs into the lungs  daily. 1 Inhaler 5  . apixaban (ELIQUIS) 5 MG TABS tablet Take 1 tablet (5 mg total) by mouth 2 (two) times daily. 60 tablet 5   No current facility-administered medications for this visit.     Functional Status:  In your present state of health, do you have any difficulty performing the following activities: 11/14/2018 10/30/2018  Hearing? N N  Vision? N N  Comment - -  Difficulty concentrating or making decisions? N N  Walking or climbing stairs? N N  Dressing or bathing? N N  Doing errands, shopping? Y Y  Preparing Food and eating ? - -  Using the Toilet? - -  In the past six months, have you accidently leaked urine? - -  Do you have problems with loss of bowel control? - -  Managing  your Medications? - -  Managing your Finances? - -  Housekeeping or managing your Housekeeping? - -  Some recent data might be hidden    Fall/Depression Screening: Fall Risk  06/18/2019 03/18/2019 03/06/2019  Falls in the past year? 0 0 0  Comment - - -  Number falls in past yr: - - -  Injury with Fall? - - -  Risk for fall due to : - - -  Follow up - - Falls evaluation completed   PHQ 2/9 Scores 11/14/2018 10/25/2018 08/13/2018 08/08/2018 05/23/2018 09/12/2017 08/15/2017  PHQ - 2 Score 0 0 0 0 0 0 0  PHQ- 9 Score - - - - - 0 0    Assessment: Patient will continue to benefit from health coach outreach for disease management and support. THN CM Care Plan Problem One     Most Recent Value  THN Long Term Goal   In 30 days will be able to verbalize no copd flares or hospital admissions.  THN Long Term Goal Start Date  06/18/19  Interventions for Problem One Long Term Goal  Discussed signs and symptoms, encouraged medication adherence and the patient become familiar with how to reorder his medications, encouraged the patient to go to appointments when needed,       Plan: [place] will contact patient in the month of October and patient agrees to next outreach. Referral sent to pharmacy for medication adherence and management.  Send not to physician.

## 2019-06-18 NOTE — Patient Outreach (Signed)
[facility]) Care Management  06/18/2019  Edward [name] [birth year] [id number]    1st unsuccessful outreach to the patient for assessment.  No answer.  Phone rang 7 times with no voicemail pickup.  Plan: RN Health Coach will send letter. RN Health Coach will make outreach attempt to the patient within thirty business days.   Lazaro Arms RN, BSN, [place] Direct Dial:  [phone]  Fax: [phone]

## 2019-06-20 ENCOUNTER — Other Ambulatory Visit: Payer: Self-pay | Admitting: Pharmacy Technician

## 2019-06-20 NOTE — Patient Outreach (Addendum)
[facility] [facility]) Care Management  06/20/2019  Robertt PABEL CIOLLI December 09, 1934 [id number]   Received in-basket message on 9/16 from [place], Traci stating that patient and son were unsure on how to obtain refills on medications that had been approved thru patient assistance. Traci provided them with my contact number and was told that they would contact me.  -I have not been contacted by patient or son  9/18 @ 12:26pm  Unsuccessful call outreach made to patient and unable to leave voicemail.  ADDENDUM 12:36pm  Return call from patients son [name]. Asked [name] if they needed me to provide them with the numbers to call his dads refills in and he stated "I'm not sure, I will let you talk to him". I asked Mr. Eckrich if he needed the information to get his refills thru patient assistance and he stated "No!". I responded "So you were able to call your refills in for your inhalers and insulin?" and he stated "No, I need those, I'm almost out". When trying to provide him the information to call his refills in, he handed the phone back to [facility] for me to give him the information. Provided [name] with the phone number to [facility] for [employer], [employer]) for [employer] and [name] and [employer] ([name]) for [employer]. [name] states that he will call and request the refills.  [name] [place] Certified Pharmacy Technician [place] Management Direct [phone]

## 2019-06-24 ENCOUNTER — Telehealth: Payer: Self-pay | Admitting: "Endocrinology

## 2019-06-24 ENCOUNTER — Telehealth: Payer: Self-pay | Admitting: Pharmacist

## 2019-06-24 ENCOUNTER — Other Ambulatory Visit: Payer: Self-pay | Admitting: "Endocrinology

## 2019-06-24 MED ORDER — BASAGLAR KWIKPEN 100 UNIT/ML ~~LOC~~ SOPN: 30.0000 [IU] | PEN_INJECTOR | Freq: Every day | SUBCUTANEOUS | 0 refills | Status: DC

## 2019-06-24 NOTE — Patient Outreach (Signed)
[facility]) Care Management  06/24/2019  Edward [name] 22-Jan-1935 [id number]  Called to follow up on medication assistance (specifically on the process to refill medicatiosn received from patient assistance programs). Patient's son, [name], answered the phone and said his father was not available at the time of my call. HIPAA identifiers were obtained.    [name] said he called the numbers he was given by Caryl Pina last week and the inhalers are scheduled to be delivered within the next two weeks.  He said [name] told him he needed "paper work from his Doctor".  [name] said he called and requested the paper work but could not remember which office he called.  Most likely, a new prescription is needed to be sent to [facility] as [facility] started using them as a dispensing pharmacy earlier this year and several patient's need new prescriptions.  Dr. Liliane Channel office was called and a message was left requesting a new prescription for Basaglar be sent to [facility]. [facility] was added to the patient's clinical demographics.  Plan: Follow up in 1-2 weeks.  [name], PharmD, [place] Clinical Pharmacist [phone]

## 2019-06-24 NOTE — Telephone Encounter (Signed)
Denyse Amass, Pharmactist w/ [facility] called and said that she would like a new RX for his Insulin Glargine (BASAGLAR KWIKPEN) 100 UNIT/ML SOPN sent to [facility]

## 2019-06-24 NOTE — Telephone Encounter (Signed)
Routing to Dr Dorris Fetch for new Rx

## 2019-06-30 ENCOUNTER — Other Ambulatory Visit: Payer: Self-pay | Admitting: Pharmacy Technician

## 2019-06-30 ENCOUNTER — Other Ambulatory Visit: Payer: Self-pay | Admitting: Pharmacist

## 2019-06-30 NOTE — Patient Outreach (Signed)
[facility]) Care Management  06/30/2019  Edward [name] [birth year] [id number]   Received message from Robbins B that provider needs to submit refill for to [facility] for patients Basaglar.  Faxed refill request form for Dr. Dennard Schaumann for completion and to be faxed to [facility] @ [phone].  [name] [place] Certified Pharmacy Technician [place] Management Direct Dial:[phone]

## 2019-07-01 NOTE — Patient Outreach (Signed)
[facility]) Care Management  07/01/2019  Edward [name] 08-Jan-1935 [id number]  Late entry for 06/30/2019  Patient was called. Spoke with his son, [name]. HIPAA identifiers were obtained x2. [name] said they had not heard anything about the patient's insulin from [place] or [facility].  Reviewed patient's chart. Dr. Liliane Channel office sent in a new prescription to [place] as requested.   Called [facility], they said the prescription sent to them was sent back to [facility] because additional information was needed. In addition, they said Dr. Dennard Schaumann was the provider who signed the original application so he would be on the new refill.  [facility] transferred me to [facility] and the representative said a "refill form" needed to be completed as the patient's application was approved before they started using [facility].  [place], Etter Sjogren was sent the refill request form via e-mail and she faxed it to Dr. Samella Parr office.  Plan: Follow up in 5-7 business days.   [name], PharmD, [place] Clinical Pharmacist [phone]

## 2019-07-03 ENCOUNTER — Other Ambulatory Visit: Payer: Self-pay | Admitting: Pharmacy Technician

## 2019-07-03 DIAGNOSIS — J449 Chronic obstructive pulmonary disease, unspecified: Secondary | ICD-10-CM | POA: Diagnosis not present

## 2019-07-03 NOTE — Patient Outreach (Signed)
[name]) Care Management  07/03/2019  Edward [name] [birth year] [id number]   Attempted to fax refill request for [place] thru [place] patient assistance to Dr. Antony Contras office. Sent Dr. Dennard Schaumann an in basket message requesting refills for Basaglar to be called into [facility]) @ [phone].  [name] [place] Certified Pharmacy Technician [place] Management Direct Dial:[phone]

## 2019-07-07 ENCOUNTER — Other Ambulatory Visit: Payer: Self-pay | Admitting: Family Medicine

## 2019-07-10 ENCOUNTER — Other Ambulatory Visit: Payer: Self-pay | Admitting: Family Medicine

## 2019-07-11 ENCOUNTER — Other Ambulatory Visit: Payer: Self-pay

## 2019-07-11 NOTE — Patient Outreach (Signed)
[name]) Care Management  07/11/2019  Edward [name] 1935/03/28 [id number]    1st unsuccessful outreach attempt.  No answer.  No voicemail pick up, message recieved "the wirerless caller is not available please call again later."  Plan: RN Health Coach will send letter. RN Health Coach will make outreach attempt to the patient within thirty business days.   Lazaro Arms RN, BSN, [place] Direct Dial:  [phone]  Fax: [phone]

## 2019-07-16 ENCOUNTER — Other Ambulatory Visit: Payer: Self-pay | Admitting: Pharmacist

## 2019-07-16 NOTE — Patient Outreach (Signed)
[facility] [facility]) Care Management  07/16/2019  Edward [name] Aug 01, 1935 [id number]   Called patient to follow up on [place] delivery from [facility]. Spoke with patient's son, [name]. HIPAA identifiers were obtained.   [name] said the patient has not received his delivery. Called [facility], the representative said they did not have a new prescription on file with paperwork.   Was transferred to [facility]. [facility] [employer] said they did not receive a refill request form from Dr. Dennard Schaumann.  Left a message at Dr. Samella Parr office requesting a cal back about the form.  Plan: Follow up in 1-2 business days.   [name], PharmD, [place] Clinical Pharmacist [phone]

## 2019-07-17 ENCOUNTER — Other Ambulatory Visit: Payer: Self-pay

## 2019-07-17 ENCOUNTER — Ambulatory Visit: Payer: Medicare Other | Admitting: "Endocrinology

## 2019-07-18 ENCOUNTER — Other Ambulatory Visit: Payer: Self-pay | Admitting: Pharmacist

## 2019-07-18 ENCOUNTER — Ambulatory Visit: Payer: Self-pay | Admitting: Pharmacist

## 2019-07-18 NOTE — Patient Outreach (Signed)
[facility]) Care Management  07/18/2019  Edward [name] 05/25/1935 [id number]  [name] from Dr. Samella Parr office left a message on my voicemail stating that their fax number changed and requested the [place] form be faxed back to their office.   The [employer] form was faxed to:  [id number] as requested.  Plan: Follow up in 2-3 business days.  [name], PharmD, [place] Clinical Pharmacist [phone]

## 2019-07-21 ENCOUNTER — Other Ambulatory Visit: Payer: Self-pay | Admitting: Pharmacist

## 2019-07-21 ENCOUNTER — Other Ambulatory Visit: Payer: Self-pay | Admitting: Family Medicine

## 2019-07-21 ENCOUNTER — Ambulatory Visit: Payer: Self-pay | Admitting: Pharmacist

## 2019-07-21 MED ORDER — BASAGLAR KWIKPEN 100 UNIT/ML ~~LOC~~ SOPN: 30.0000 [IU] | PEN_INJECTOR | Freq: Every day | SUBCUTANEOUS | 3 refills | Status: DC

## 2019-07-21 NOTE — Patient Outreach (Signed)
[place] [facility]) Care Management  07/21/2019  Romone ENIS TUCHMAN [birth year] [id number]   [place] at [place]. [name] office on patient's behalf in reference to his [place] refill. [facility] said they have not received anything from [employer]. A message was left with Lovey Newcomer at Dr. Samella Parr office. Called and spoke with [place], Etter Sjogren as well.   Plan: Follow up with the patient and provider  in the next few days.   [name], PharmD, [place] Clinical Pharmacist [phone]

## 2019-07-25 ENCOUNTER — Ambulatory Visit: Payer: Self-pay | Admitting: Pharmacist

## 2019-07-30 ENCOUNTER — Other Ambulatory Visit: Payer: Self-pay

## 2019-07-30 NOTE — Patient Outreach (Signed)
[place]) Care Management  07/30/2019   Edward [name] 1935/07/08 [id number]  Subjective: Successful outreach to the patient.  Two patient identifiers given.  The patient states that he has been doing well. He denies and signs and symptoms of COPD. Discussed some things that he can do to avoid having an exacerbation.  For instance staying away from smoke, air pollution and drink water.  He verbalized understanding.   He is not having any pain at this time and no falls.  He states that he is taking his medications. He is still waiting for his diabetes medication to come but it has been ordered.  He states that he has medication until the refills arrive. His blood sugar this morning was 166 fasting.    Patient was notified that I am moving to a new position and one of my colleagues will follow up with him.  The patient verbalized understanding.  Current Medications:  Current Outpatient Medications  Medication Sig Dispense Refill  . albuterol (PROVENTIL) (2.5 MG/3ML) 0.083% nebulizer solution INHALE 1 VIAL VIA NEBULIZER EVERY 6 HOURS AS NEEDED FOR WHEEZING OR SHORTNESS OF BREATH 360 mL 4  . apixaban (ELIQUIS) 5 MG TABS tablet Take 1 tablet (5 mg total) by mouth 2 (two) times daily. 60 tablet 5  . aspirin EC 81 MG tablet Take 1 tablet (81 mg total) by mouth daily. Resume on 7/24    . cloNIDine (CATAPRES) 0.1 MG tablet TAKE 1 TABLET BY MOUTH 2 TIMES A DAY 60 tablet 3  . diltiazem (CARDIZEM CD) 240 MG 24 hr capsule TAKE 1 CAPSULE BY MOUTH DAILY 90 capsule 4  . furosemide (LASIX) 40 MG tablet Take 1 tablet (40 mg total) by mouth daily. 90 tablet 3  . glucose blood (ONE TOUCH ULTRA TEST) test strip CHECK FASTING BLOOD SUGAR TWICE DAILY 100 each 4  . Insulin Glargine (BASAGLAR KWIKPEN) 100 UNIT/ML SOPN Inject 0.3 mLs (30 Units total) into the skin at bedtime. 10 pen 3  . ipratropium (ATROVENT) 0.02 % nebulizer solution INHALE THE CONTENTS OF 1 VIAL VIA NEBULIZATION 4 TIMES DAILY 75  mL 11  . Lancets (ONETOUCH DELICA PLUS 123XX123) MISC USE TO CHECK BLOOD SUGAR TWICE DAILY AS DIRECTED 100 each 2  . LORazepam (ATIVAN) 0.5 MG tablet Take 1 tablet (0.5 mg total) by mouth at bedtime as needed for sleep. 30 tablet 1  . metFORMIN (GLUCOPHAGE) 500 MG tablet TAKE 1 TABLET BY MOUTH TWICE DAILY WITH MEALS 180 tablet 0  . metoprolol tartrate (LOPRESSOR) 25 MG tablet TAKE 1 TABLET BY MOUTH TWICE DAILY 60 tablet 3  . mometasone-formoterol (DULERA) 200-5 MCG/ACT AERO Inhale 2 puffs into the lungs 2 (two) times daily. 13 g 5  . OXYGEN Inhale 3 L into the lungs continuous.     . polyethylene glycol (MIRALAX / GLYCOLAX) packet Take 17 g by mouth daily as needed for mild constipation or moderate constipation.    . potassium chloride SA (KLOR-CON) 20 MEQ tablet TAKE 1 TABLET BY MOUTH DAILY WITH LASIX 30 tablet 2  . pravastatin (PRAVACHOL) 80 MG tablet TAKE 1 TABLET BY MOUTH AT BEDTIME 90 tablet 4  . Tiotropium Bromide Monohydrate (SPIRIVA RESPIMAT) 2.5 MCG/ACT AERS Inhale 2 puffs into the lungs daily. 1 Inhaler 5   No current facility-administered medications for this visit.     Functional Status:  In your present state of health, do you have any difficulty performing the following activities: 11/14/2018 10/30/2018  Hearing? N N  Vision? N  N  Comment - -  Difficulty concentrating or making decisions? N N  Walking or climbing stairs? N N  Dressing or bathing? N N  Doing errands, shopping? Y Y  Preparing Food and eating ? - -  Using the Toilet? - -  In the past six months, have you accidently leaked urine? - -  Do you have problems with loss of bowel control? - -  Managing your Medications? - -  Managing your Finances? - -  Housekeeping or managing your Housekeeping? - -  Some recent data might be hidden    Fall/Depression Screening: Fall Risk  07/30/2019 06/18/2019 03/18/2019  Falls in the past year? 0 0 0  Comment - - -  Number falls in past yr: - - -  Injury with Fall? - - -   Risk for fall due to : - - -  Follow up - - -   PHQ 2/9 Scores 11/14/2018 10/25/2018 08/13/2018 08/08/2018 05/23/2018 09/12/2017 08/15/2017  PHQ - 2 Score 0 0 0 0 0 0 0  PHQ- 9 Score - - - - - 0 0    Assessment: Patient will continue to benefit from health coach outreach for disease management and support. THN CM Care Plan Problem One     Most Recent Value  THN Long Term Goal   In 60 days will be able to verbalize no copd flares or hospital admissions.  THN Long Term Goal Start Date  07/30/19  Interventions for Problem One Long Term Goal  Reviewed signs and symptoms of COPD,Discussed with the patient about things that he needs to avoid IE..smoke, air pollution, reviwed medications and encouraged adherence and getting refilss before other medications run out, encouraged attending any follow up appointments       Plan: [place] will contact patient in the month of December and patient agrees to next outreach.   Lazaro Arms RN, BSN, [place] Direct Dial:  [phone]  Fax: [phone]

## 2019-08-03 DIAGNOSIS — J449 Chronic obstructive pulmonary disease, unspecified: Secondary | ICD-10-CM | POA: Diagnosis not present

## 2019-08-04 ENCOUNTER — Other Ambulatory Visit: Payer: Self-pay | Admitting: "Endocrinology

## 2019-08-11 ENCOUNTER — Other Ambulatory Visit: Payer: Self-pay | Admitting: Family Medicine

## 2019-08-27 ENCOUNTER — Other Ambulatory Visit: Payer: Self-pay | Admitting: Family Medicine

## 2019-08-27 NOTE — Telephone Encounter (Signed)
Ok to refill??  Last office visit/ refill 05/19/2019, #1 refill.

## 2019-09-02 DIAGNOSIS — J449 Chronic obstructive pulmonary disease, unspecified: Secondary | ICD-10-CM | POA: Diagnosis not present

## 2019-09-10 ENCOUNTER — Other Ambulatory Visit: Payer: Self-pay | Admitting: Pharmacist

## 2019-09-10 NOTE — Patient Outreach (Addendum)
[facility]) Care Management  09/10/2019  Kiven OCTAVIA BRONKEMA 08/27/1935 [id number]   Spoke with patient and his son on speaker phone. HIPAA identifiers were obtained. Patient confirmed he received his shipment of [place].  HgA1c-11.8% 05/2019 (patient now has Basaglar in his possession)  On [place] filled 08/08/2019.  No medication changes.  Medications Reviewed Today    Reviewed by [name], [facility] (Pharmacist) on 09/10/19 at [place] List Status: <None>  Medication Order Taking? Sig Documenting Provider Last Dose Status Informant  albuterol (PROVENTIL) (2.5 MG/3ML) 0.083% nebulizer solution [id number] Yes INHALE 1 VIAL VIA NEBULIZER EVERY 6 HOURS AS NEEDED FOR WHEEZING OR SHORTNESS OF BREATH Susy Frizzle, MD Taking Active   apixaban (ELIQUIS) 5 MG TABS tablet [id number] Yes Take 1 tablet (5 mg total) by mouth 2 (two) times daily. Susy Frizzle, MD Taking Active   aspirin EC 81 MG tablet [id number] Yes Take 1 tablet (81 mg total) by mouth daily. Resume on 7/24 Kathie Dike, MD Taking Active Self  cloNIDine (CATAPRES) 0.1 MG tablet [id number] Yes TAKE 1 TABLET BY MOUTH 2 TIMES A DAY Susy Frizzle, MD Taking Active   diltiazem (CARDIZEM CD) 240 MG 24 hr capsule [id number] Yes TAKE 1 CAPSULE BY MOUTH DAILY Susy Frizzle, MD Taking Active Self  furosemide (LASIX) 40 MG tablet [id number] Yes Take 1 tablet (40 mg total) by mouth daily. Susy Frizzle, MD Taking Active Self  glucose blood (ONE TOUCH ULTRA TEST) test strip [id number] Yes CHECK FASTING BLOOD SUGAR TWICE DAILY Susy Frizzle, MD Taking Active Self  Insulin [facility] KWIKPEN) 100 UNIT/ML SOPN [id number] Yes Inject 0.3 mLs (30 Units total) into the skin at bedtime. Susy Frizzle, MD Taking Active   ipratropium (ATROVENT) 0.02 % nebulizer solution [id number] Yes INHALE THE CONTENTS OF 1 VIAL VIA NEBULIZATION 4 TIMES DAILY Susy Frizzle, MD Taking Active   Lancets  (ONETOUCH DELICA PLUS 123XX123) [place] [id number] Yes USE TO CHECK BLOOD SUGAR TWICE DAILY AS DIRECTED Susy Frizzle, MD Taking Active   LORazepam (ATIVAN) 0.5 MG tablet [id number] Yes TAKE 1 TABLET BY MOUTH ONCE DAILY AT BEDTIME AS NEEDED FOR SLEEP [place], Modena Nunnery, MD Taking Active   metFORMIN (GLUCOPHAGE) 500 MG tablet [id number] Yes TAKE 1 TABLET BY MOUTH TWICE DAILY WITH MEALS [name], [name], MD Taking Active   metoprolol tartrate (LOPRESSOR) 25 MG tablet [id number] Yes TAKE 1 TABLET BY MOUTH TWICE DAILY Susy Frizzle, MD Taking Active   mometasone-formoterol [facility]) 200-5 MCG/ACT Hollie Salk [id number] Yes Inhale 2 puffs into the lungs 2 (two) times daily. Susy Frizzle, MD Taking Active   OXYGEN [id number] Yes Inhale 3 L into the lungs continuous.  [provider] Taking Active Self  polyethylene glycol (MIRALAX / GLYCOLAX) packet [id number] Yes Take 17 g by mouth daily as needed for mild constipation or moderate constipation. [name], MD Taking Active Self  potassium chloride SA (KLOR-CON) 20 MEQ tablet [id number] Yes TAKE 1 TABLET BY MOUTH DAILY WITH Vilinda Blanks, MD Taking Active   pravastatin (PRAVACHOL) 80 MG tablet [id number] Yes TAKE 1 TABLET BY MOUTH AT BEDTIME Susy Frizzle, MD Taking Active Self  Tiotropium Bromide Monohydrate (SPIRIVA RESPIMAT) 2.5 MCG/ACT AERS [id number] Yes Inhale 2 puffs into the lungs daily. Susy Frizzle, MD Taking Active   Med List Note [name] 09/22/17 2323): Patient states that his pharmacy is [facility]  Plan: [facility] assisted the patient with medication assistance for 2020 with Basaglar, Spiriva, and Eliquis.   [place] requires patients to spend at least 3% of their income in medication expenses to qualify for patient assistance. (We will monitor and send an application for Eliquis when patient gets closer to spending the required out-of-pocket.)  Spiriva is provided  by [employer]., Ruthe Mannan by [employer], and Engineer, agricultural is provided by [employer]. (None of these programs require an out-of-pocket expenditure.)  Patient will be sent applications for the 123XX123 benefit year.  Plan: I will route patient assistance letter to [place] technician who will coordinate patient assistance program application process for medications listed above.  [facility] pharmacy technician will assist with obtaining all required documents from both patient and provider(s) and submit application(s) once completed.   Follow up in 8-10 weeks.   [name], PharmD, [place] Clinical Pharmacist [phone]

## 2019-09-11 ENCOUNTER — Other Ambulatory Visit: Payer: Self-pay | Admitting: Pharmacy Technician

## 2019-09-11 IMAGING — US US EXTREM LOW VENOUS*R*
1 series · 13 of 24 positions shown · non-contrast
Comparison: None.

CLINICAL DATA: Right lower extremity pain for the past 3-4 weeks.
History of CHF. Former smoker. Evaluate for DVT.



[Series 1: us extrem low venous*right* · 0.08mm/px · 13 of 36 slices shown]
[im 1/36]
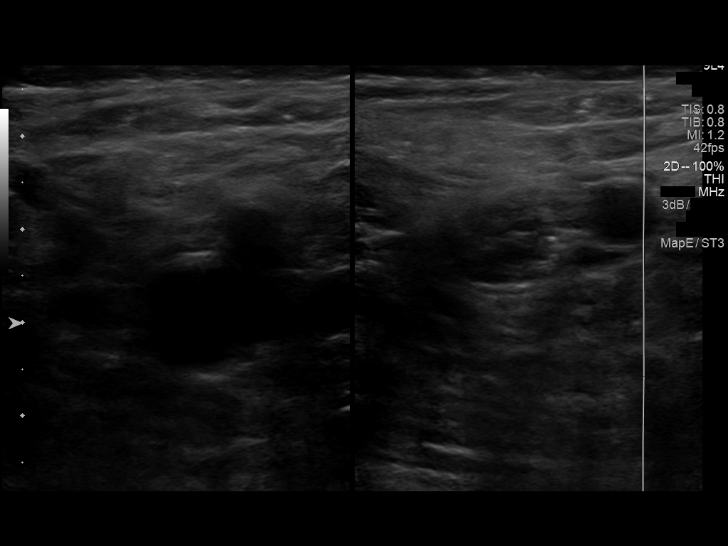
[im 4/36]
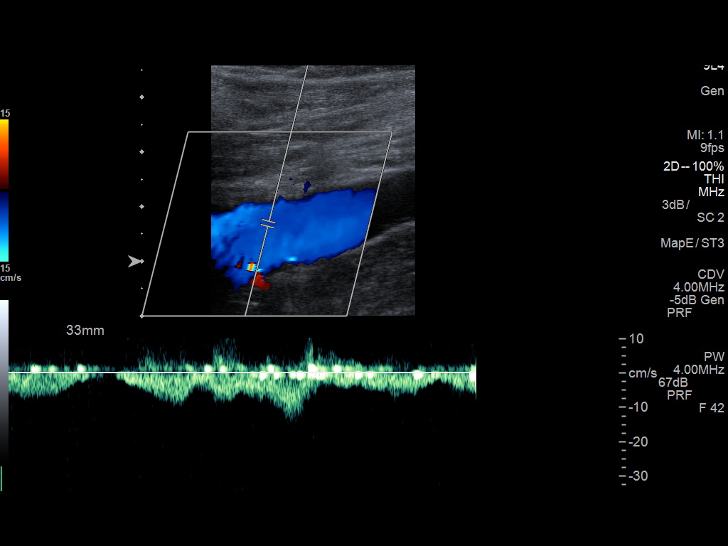
[im 7/36]
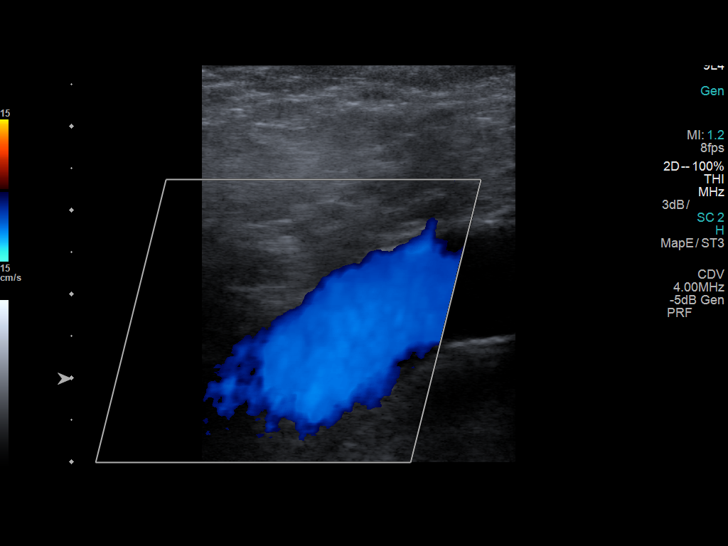
[im 10/36]
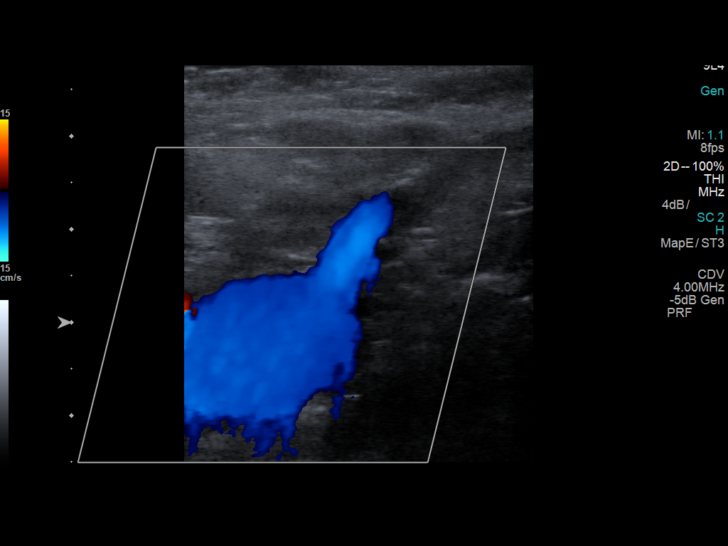
[im 13/36]
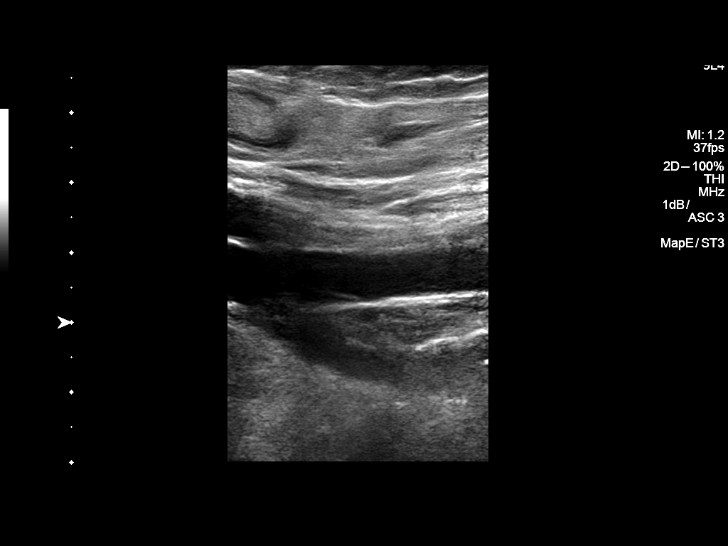
[im 16/36]
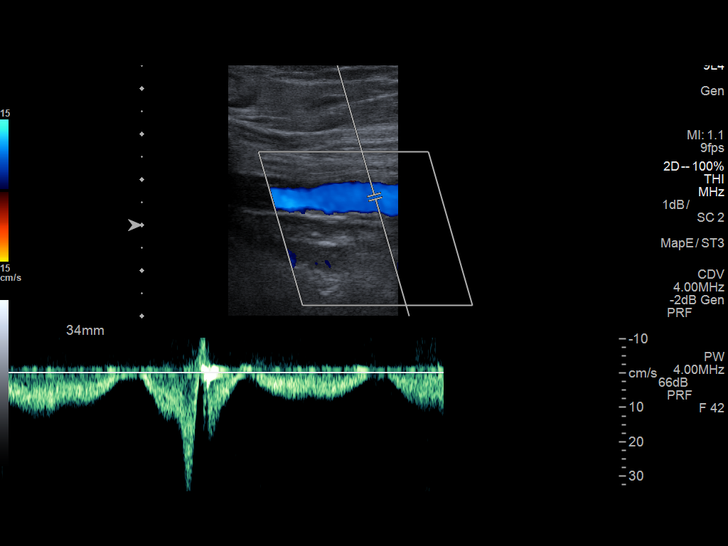
[im 19/36]
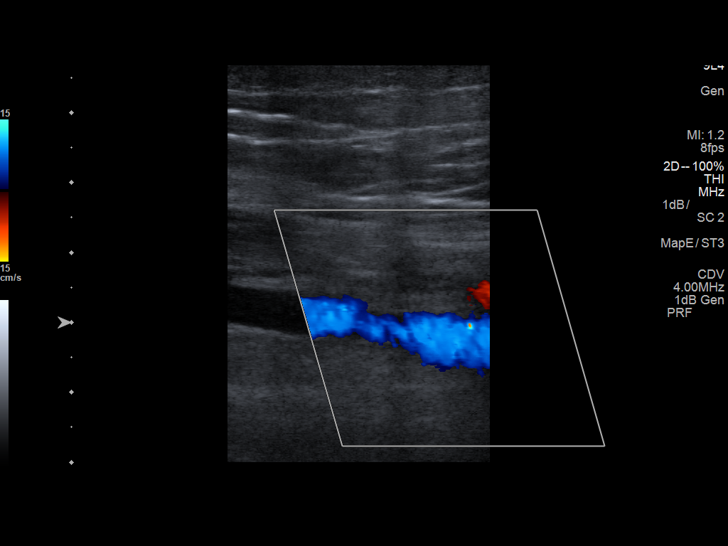
[im 20/36]
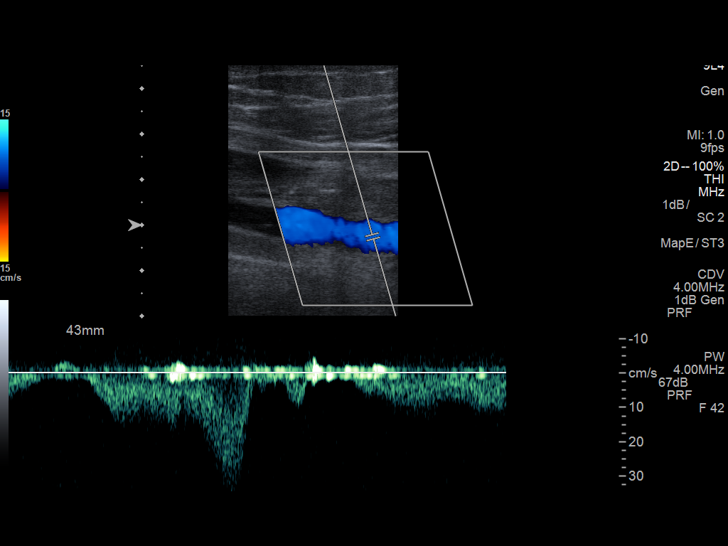
[im 23/36]
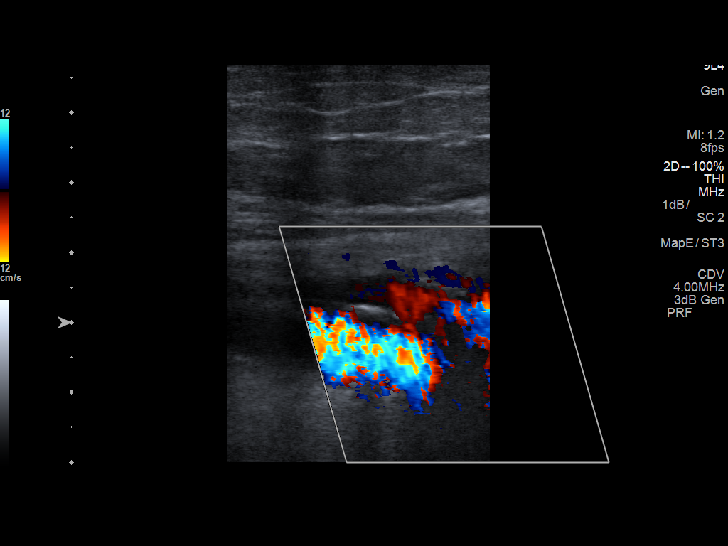
[im 26/36]
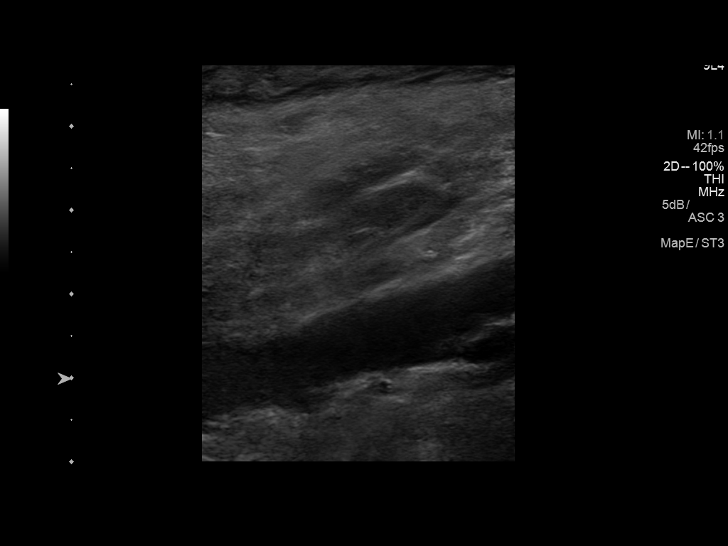
[im 29/36]
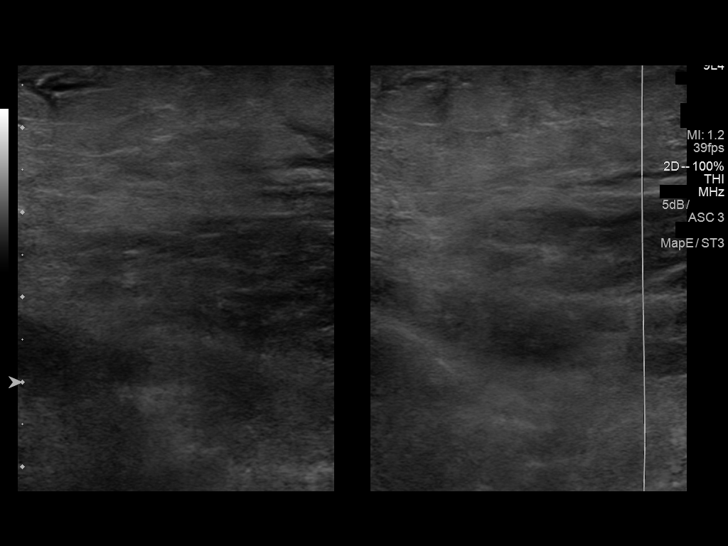
[im 32/36]
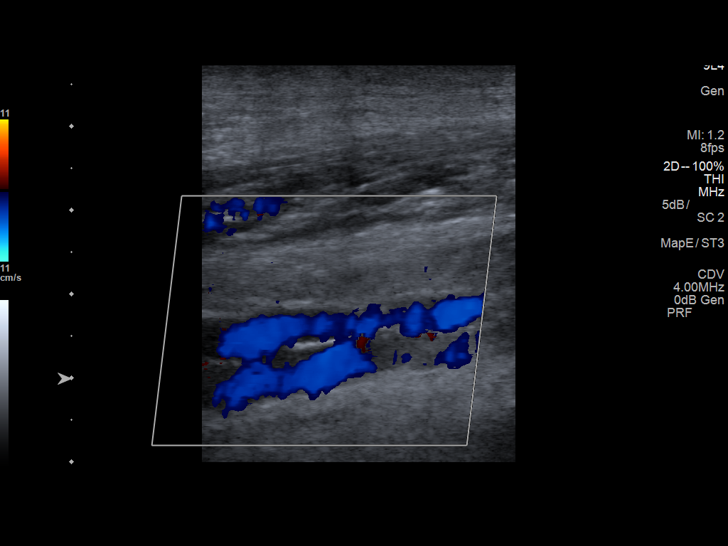
[im 36/36]
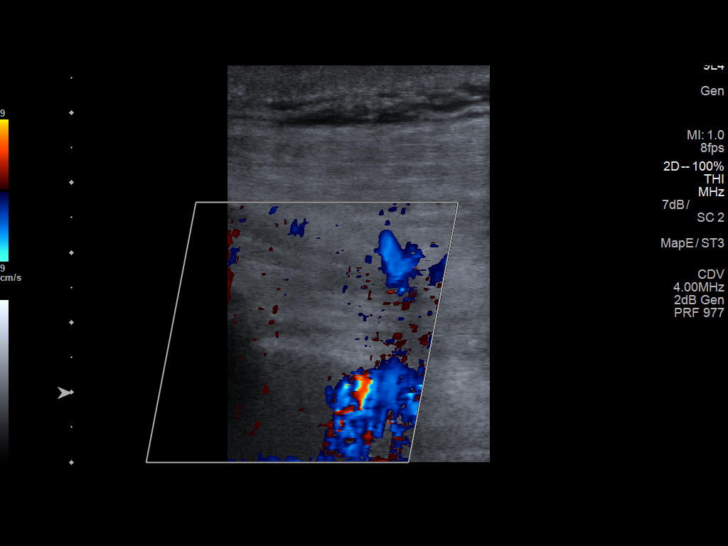

[13 of 24 positions shown; findings below may reference images not displayed]

FINDINGS: Contralateral Common Femoral Vein: Respiratory phasicity is normal
and symmetric with the symptomatic side. No evidence of thrombus.
Normal compressibility.

Common Femoral Vein: No evidence of thrombus. Normal
compressibility, respiratory phasicity and response to augmentation.

Saphenofemoral Junction: No evidence of thrombus. Normal
compressibility and flow on color Doppler imaging.

Profunda Femoral Vein: No evidence of thrombus. Normal
compressibility and flow on color Doppler imaging.

Femoral Vein: No evidence of thrombus. Normal compressibility,
respiratory phasicity and response to augmentation.

Popliteal Vein: No evidence of thrombus. Normal compressibility,
respiratory phasicity and response to augmentation.

Calf Veins: No evidence of thrombus. Normal compressibility and flow
on color Doppler imaging.

Superficial Great Saphenous Vein: No evidence of thrombus. Normal
compressibility.

Venous Reflux:  None.

Other Findings:  None.
IMPRESSION: No evidence of DVT within the right lower extremity.

## 2019-09-11 NOTE — Patient Outreach (Signed)
[place] [facility]) Care Management  09/11/2019  GIULIAN ASSI 1935/02/12 [id number]                                        Medication Assistance Referral  Referral From: [place]  Medication/Company: Stann [employer] / BI Patient application portion:  Mailed Provider application portion: Faxed  to Dr. Jenna Luo Provider address/fax verified via: Office website  Medication/Company: Judene Companion Patient application portion:  Mailed Provider application portion: Faxed  to Dr. Jenna Luo Provider address/fax verified via: Office website  Medication/Company: Ruthe Mannan / [employer] Patient application portion:  Mailed Provider application portion: Interoffice Mailed to Dr. Jenna Luo Provider address/fax verified via: Office website    Follow up:  Will follow up with patient in 20-30 business days to confirm application(s) have been received.  Corion Sherrod P. Urie Loughner, [place] Management [phone]

## 2019-09-15 ENCOUNTER — Encounter: Payer: Self-pay | Admitting: *Deleted

## 2019-09-15 ENCOUNTER — Other Ambulatory Visit: Payer: Self-pay | Admitting: *Deleted

## 2019-09-15 NOTE — Patient Outreach (Signed)
[facility]) Care Management  [place]  09/15/2019   Edward [name] 15-Aug-1935 [id number]   [place] Monthly Outreach  Referral Date:  10/15/2018 Referral Source:  Transfer from [place] Reason for Referral:  Continued Disease Management Education Insurance:  [employer]   Outreach Attempt:  Successful telephone outreach to patient for follow up.  HIPAA verified with patient.  Patient reporting he is doing ok.  Denies any recent sick days.  States shortness of breath is normal with activity.  Reports he is using his oxygen 3 l/m mostly continuous.  Denies any lower extremity edema and states he uses his nebulizer machine about 3 times a day.  Monitors blood sugars at least daily.  Fasting blood sugar this morning was 130 with recent fasting ranges of 130-150's.  Does report he stopped monitoring evening blood sugars due to them ranging greater than 200's on most occasions.  Discussed with patient latest Hgb A1C increased to 11.8.  Discussed importance of glycemic control and ways to help reduce A1C.  Confirmed patient continues to receive his insulin through the medication assistance program and he states his compliance with the medication.  Encounter Medications:  Outpatient Encounter Medications as of 09/15/2019  Medication Sig  . albuterol (PROVENTIL) (2.5 MG/3ML) 0.083% nebulizer solution INHALE 1 VIAL VIA NEBULIZER EVERY 6 HOURS AS NEEDED FOR WHEEZING OR SHORTNESS OF BREATH  . apixaban (ELIQUIS) 5 MG TABS tablet Take 1 tablet (5 mg total) by mouth 2 (two) times daily.  Marland Kitchen aspirin EC 81 MG tablet Take 1 tablet (81 mg total) by mouth daily. Resume on 7/24  . cloNIDine (CATAPRES) 0.1 MG tablet TAKE 1 TABLET BY MOUTH 2 TIMES A DAY  . diltiazem (CARDIZEM CD) 240 MG 24 hr capsule TAKE 1 CAPSULE BY MOUTH DAILY  . furosemide (LASIX) 40 MG tablet Take 1 tablet (40 mg total) by mouth daily.  . Insulin Glargine (BASAGLAR KWIKPEN) 100  UNIT/ML SOPN Inject 0.3 mLs (30 Units total) into the skin at bedtime.  Marland Kitchen ipratropium (ATROVENT) 0.02 % nebulizer solution INHALE THE CONTENTS OF 1 VIAL VIA NEBULIZATION 4 TIMES DAILY  . LORazepam (ATIVAN) 0.5 MG tablet TAKE 1 TABLET BY MOUTH ONCE DAILY AT BEDTIME AS NEEDED FOR SLEEP  . metFORMIN (GLUCOPHAGE) 500 MG tablet TAKE 1 TABLET BY MOUTH TWICE DAILY WITH MEALS  . metoprolol tartrate (LOPRESSOR) 25 MG tablet TAKE 1 TABLET BY MOUTH TWICE DAILY  . mometasone-formoterol (DULERA) 200-5 MCG/ACT AERO Inhale 2 puffs into the lungs 2 (two) times daily.  . OXYGEN Inhale 3 L into the lungs continuous.   . polyethylene glycol (MIRALAX / GLYCOLAX) packet Take 17 g by mouth daily as needed for mild constipation or moderate constipation.  . potassium chloride SA (KLOR-CON) 20 MEQ tablet TAKE 1 TABLET BY MOUTH DAILY WITH LASIX  . pravastatin (PRAVACHOL) 80 MG tablet TAKE 1 TABLET BY MOUTH AT BEDTIME  . Tiotropium Bromide Monohydrate (SPIRIVA RESPIMAT) 2.5 MCG/ACT AERS Inhale 2 puffs into the lungs daily.  Marland Kitchen glucose blood (ONE TOUCH ULTRA TEST) test strip CHECK FASTING BLOOD SUGAR TWICE DAILY  . Lancets (ONETOUCH DELICA PLUS [id number]) [place] USE TO CHECK BLOOD SUGAR TWICE DAILY AS DIRECTED   No facility-administered encounter medications on file as of 09/15/2019.    Functional Status:  In your present state of health, do you have any difficulty performing the following activities: 09/15/2019 11/14/2018  Hearing? N N  Vision? N N  Difficulty concentrating or making decisions? N  N  Walking or climbing stairs? N N  Dressing or bathing? N N  Doing errands, shopping? N Y  Conservation officer, nature and eating ? N -  Using the Toilet? N -  In the past six months, have you accidently leaked urine? N -  Do you have problems with loss of bowel control? Y -  Comment stool incontinence at times -  Managing your Medications? N -  Managing your Finances? N -  Housekeeping or managing your Housekeeping? N -  Some recent  data might be hidden    Fall/Depression Screening: Fall Risk  09/15/2019 07/30/2019 06/18/2019  Falls in the past year? 0 0 0  Comment - - -  Number falls in past yr: - - -  Injury with Fall? - - -  Risk for fall due to : History of fall(s);Impaired balance/gait;Orthopedic patient;Impaired mobility;Impaired vision - -  Follow up Falls evaluation completed;Education provided;Falls prevention discussed - -   PHQ 2/9 Scores 11/14/2018 10/25/2018 08/13/2018 08/08/2018 05/23/2018 09/12/2017 08/15/2017  PHQ - 2 Score 0 0 0 0 0 0 0  PHQ- 9 Score - - - - - 0 0   THN CM Care Plan Problem One     Most Recent Value  Care Plan Problem One  Knowledge deficiet related to self care management of COPD and diabetes and medical follow up  Role Documenting the Problem One  [place] for Problem One  Active  THN Long Term Goal   Patient will report decreasing Hgb A1C by 1 point in the next 90 days.  THN Long Term Goal Start Date  09/15/19  THN Long Term Goal Met Date  09/15/19  Interventions for Problem One Long Term Goal  Care plan reviewed and discussed, reviewed medications and encouraged medication compliance, reviewed most recent Hgb A1C of 11.8 and discussed ways to reduce, discussed importance of glycemic control, discussed healthier food and drink options, confirmed patient is still recieving insuin through medication assistance program  [facility] CM Short Term Goal #1   Patient will schedule follow up appointment with primary care provider within the next 30 days.  THN CM Short Term Goal #1 Start Date  09/15/19  Interventions for Short Term Goal #1  reviewed with patient last appointment with primary care provider, discussed importance of medical follow up, encouraged patient to contact primary care office to verify next follow up needed and schedule appointment  [facility] CM Short Term Goal #2   Patient will reschedule missed endocrinology appointment within the next 30 days.  THN CM Short Term Goal  #2 Start Date  09/15/19  Interventions for Short Term Goal #2  Reviewed with patient last endocrinology follow up, discussed importance of endocrinology follow up, encouraged patient to reschedule missed appointment     Appointments:  Patient last attended appointment with primary care provider on 05/19/2019 and does not have follow up scheduled.  Last attended appointment with Endocrinologist, Dr. Dorris Fetch on 03/17/2019 and missed appointment on 07/17/2019.  Encouraged patient to contact both primary care provider, Dr. Dennard Schaumann and Endocrinologist, Dr. Dorris Fetch to schedule follow up appointments as soon as possible.  Patient stated he would do so.  Plan: RN Health Coach will send primary care provider quarterly update. RN Health Coach will make next telephone outreach to patient within the month of February and patient agreeable to follow up outreach.  [place] [phone] [email or web address]

## 2019-09-29 ENCOUNTER — Ambulatory Visit: Payer: Medicare Other

## 2019-09-30 ENCOUNTER — Other Ambulatory Visit: Payer: Self-pay | Admitting: Pharmacy Technician

## 2019-09-30 NOTE — Patient Outreach (Signed)
[facility] [facility]) Care Management  09/30/2019  Edward [name] 27-Jul-1935 [id number]   Received both patient and provider portion(s) of patient assistance application(s) for [facility] with [employer] and Engineer, agricultural with [employer]. [place] completed application and required documents into [employer] and Faxed completed application and required documents into [place].  Will follow up with company(ies) in 7-14 business days to check status of application(s).  Aily Tzeng P. [place], [place] Management [phone]

## 2019-10-03 DIAGNOSIS — J449 Chronic obstructive pulmonary disease, unspecified: Secondary | ICD-10-CM | POA: Diagnosis not present

## 2019-10-06 ENCOUNTER — Other Ambulatory Visit: Payer: Self-pay | Admitting: Pharmacy Technician

## 2019-10-06 NOTE — Patient Outreach (Signed)
[facility] [facility]) Care Management  10/06/2019  Edward [name] FREE November 19, 1934 [id number]  Unsuccessful outreach call placed to patient in regards to [facility] application for Spiriva.  Unfortunately patient did not answer the phone, a voicemail message was UNABLE to be left as a pre recorded message came on that said  "The person you are trying to reach is not accepting calls at this time please try your call again later"  Was calling patient because I received his other patient assistance applications, however the application with [employer] for Spiriva was missing.  Will attempt a 2nd outreach call in 3-7 business days.  Jhovani Griswold P. Ryenne Lynam, [place] Management [phone]

## 2019-10-13 ENCOUNTER — Other Ambulatory Visit: Payer: Self-pay | Admitting: Pharmacy Technician

## 2019-10-13 ENCOUNTER — Encounter: Payer: Self-pay | Admitting: Family Medicine

## 2019-10-13 ENCOUNTER — Ambulatory Visit (INDEPENDENT_AMBULATORY_CARE_PROVIDER_SITE_OTHER): Payer: Medicare Other | Admitting: Family Medicine

## 2019-10-13 ENCOUNTER — Other Ambulatory Visit: Payer: Self-pay

## 2019-10-13 VITALS — BP 140/70 | HR 80 | Temp 97.4°F | Resp 18 | Ht 65.0 in | Wt 169.0 lb

## 2019-10-13 DIAGNOSIS — Z794 Long term (current) use of insulin: Secondary | ICD-10-CM

## 2019-10-13 DIAGNOSIS — J449 Chronic obstructive pulmonary disease, unspecified: Secondary | ICD-10-CM

## 2019-10-13 DIAGNOSIS — E1122 Type 2 diabetes mellitus with diabetic chronic kidney disease: Secondary | ICD-10-CM

## 2019-10-13 DIAGNOSIS — N182 Chronic kidney disease, stage 2 (mild): Secondary | ICD-10-CM | POA: Diagnosis not present

## 2019-10-13 DIAGNOSIS — I480 Paroxysmal atrial fibrillation: Secondary | ICD-10-CM

## 2019-10-13 DIAGNOSIS — L03211 Cellulitis of face: Secondary | ICD-10-CM

## 2019-10-13 DIAGNOSIS — I5032 Chronic diastolic (congestive) heart failure: Secondary | ICD-10-CM | POA: Diagnosis not present

## 2019-10-13 MED ORDER — DOXYCYCLINE HYCLATE 100 MG PO TABS: 100.0000 mg | ORAL_TABLET | Freq: Two times a day (BID) | ORAL | 0 refills | Status: DC

## 2019-10-13 MED ORDER — PEN NEEDLES 31G X 6 MM MISC: 2 refills | Status: DC

## 2019-10-13 NOTE — Progress Notes (Signed)
Subjective:    Patient ID: Edward [name], male    DOB: 1935-05-08, 85 y.o.   MRN: [id number]  Patient has a longstanding history of paroxysmal atrial fibrillation, COPD with suboptimal maintenance, medication noncompliance due to cost, and insulin-dependent diabetes mellitus.  He also has a history of diastolic heart failure.  Today he is in normal sinus rhythm.  He is not sure what inhaler he is taking.  He is supposed to be on Dulera and Spiriva however patient only takes medication if he can get samples as he is unable to afford namebrand medication.  Recently patient assistance reached out to me for possible financial assistance.  Therefore I am not certain if they have switched his inhaler or what inhaler he is taking.  He tells me that he takes a yellow 1 sometimes and a blue 1 sometimes.  He is also on one that twists.  Therefore I think he is probably taking Spiriva as this is most likely the one that is twisting.  I am not sure if he is on the [facility].  He denies any recent exacerbations.  He has not wheezing today on exam extensively although there are faint expiratory wheezes.  He denies any cough or congestion.  Of note his nose is extremely erythematous.  It is hot and tender to touch.  Patient appears to be developing erysipelas on his nose.  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  He is on 30 units of Basaglar daily along with Metformin.  He brings in a few sugars for me to review.  Fasting sugars are typically between 101 180.  He has 1 isolated sugar greater than 200.  However these are random sugars.  I am not certain how often he is checking this.  He denies any hypoglycemia.  Erythema and warmth consistent with erysipelas erythema and warmth consistent with erysipelas Past Medical History:  Diagnosis Date  . Allergy    Rhinitis  . Atrial fibrillation ([place])   . Bronchitis   . Chronic respiratory  failure ([place])   . Colon polyps   . COPD (chronic obstructive pulmonary disease) ([place])   . Diabetes mellitus   . Elevated lipids   . Hypercholesterolemia   . Hypertension   . Iron deficiency anemia due to chronic blood loss 04/30/2018  . Noncompliance   . On home O2    2L N/C   . PSA elevation   . Pulmonary fibrosis ([place])   . Vitamin D deficiency    Past Surgical History:  Procedure Laterality Date  . BIOPSY  04/23/2018   Procedure: BIOPSY;  Surgeon: Danie Binder, MD;  Location[facility];  Service: Endoscopy;;  duodenum gastric  . CATARACT EXTRACTION W/PHACO  06/25/2012   Procedure: CATARACT EXTRACTION PHACO AND INTRAOCULAR LENS PLACEMENT (IOC);  Surgeon: Elta Guadeloupe T. Gershon Crane, MD;  Location: [facility] ORS;  Service: Ophthalmology;  Laterality: Left;  CDE=19.01  . CATARACT EXTRACTION W/PHACO  07/09/2012   Procedure: CATARACT EXTRACTION PHACO AND INTRAOCULAR LENS PLACEMENT (IOC);  Surgeon: Elta Guadeloupe T. Gershon Crane, MD;  Location: [facility] ORS;  Service: Ophthalmology;  Laterality: Right;  CDE: 20.09  . COLONOSCOPY WITH PROPOFOL N/A 04/23/2018   Procedure: COLONOSCOPY WITH PROPOFOL;  Surgeon: Danie Binder, MD;  Location[facility];  Service: Endoscopy;  Laterality: N/A;  . ESOPHAGOGASTRODUODENOSCOPY (EGD) WITH PROPOFOL N/A 04/23/2018   Procedure: ESOPHAGOGASTRODUODENOSCOPY (EGD) WITH PROPOFOL;  Surgeon: Barney Drain  L, MD;  [facility];  Service: Endoscopy;  Laterality: N/A;   Current Outpatient Medications on File Prior to Visit  Medication Sig Dispense Refill  . albuterol (PROVENTIL) (2.5 MG/3ML) 0.083% nebulizer solution INHALE 1 VIAL VIA NEBULIZER EVERY 6 HOURS AS NEEDED FOR WHEEZING OR SHORTNESS OF BREATH 360 mL 4  . apixaban (ELIQUIS) 5 MG TABS tablet Take 1 tablet (5 mg total) by mouth 2 (two) times daily. 60 tablet 5  . aspirin EC 81 MG tablet Take 1 tablet (81 mg total) by mouth daily. Resume on 7/24    . cloNIDine (CATAPRES) 0.1 MG tablet TAKE 1 TABLET BY MOUTH 2 TIMES A DAY 60  tablet 3  . diltiazem (CARDIZEM CD) 240 MG 24 hr capsule TAKE 1 CAPSULE BY MOUTH DAILY 90 capsule 4  . furosemide (LASIX) 40 MG tablet Take 1 tablet (40 mg total) by mouth daily. 90 tablet 3  . glucose blood (ONE TOUCH ULTRA TEST) test strip CHECK FASTING BLOOD SUGAR TWICE DAILY 100 each 4  . Insulin Glargine (BASAGLAR KWIKPEN) 100 UNIT/ML SOPN Inject 0.3 mLs (30 Units total) into the skin at bedtime. 10 pen 3  . ipratropium (ATROVENT) 0.02 % nebulizer solution INHALE THE CONTENTS OF 1 VIAL VIA NEBULIZATION 4 TIMES DAILY 75 mL 11  . Lancets (ONETOUCH DELICA PLUS 123XX123) MISC USE TO CHECK BLOOD SUGAR TWICE DAILY AS DIRECTED 100 each 2  . LORazepam (ATIVAN) 0.5 MG tablet TAKE 1 TABLET BY MOUTH ONCE DAILY AT BEDTIME AS NEEDED FOR SLEEP 30 tablet 1  . metFORMIN (GLUCOPHAGE) 500 MG tablet TAKE 1 TABLET BY MOUTH TWICE DAILY WITH MEALS 180 tablet 0  . metoprolol tartrate (LOPRESSOR) 25 MG tablet TAKE 1 TABLET BY MOUTH TWICE DAILY 60 tablet 3  . mometasone-formoterol (DULERA) 200-5 MCG/ACT AERO Inhale 2 puffs into the lungs 2 (two) times daily. 13 g 5  . OXYGEN Inhale 3 L into the lungs continuous.     . polyethylene glycol (MIRALAX / GLYCOLAX) packet Take 17 g by mouth daily as needed for mild constipation or moderate constipation.    . potassium chloride SA (KLOR-CON) 20 MEQ tablet TAKE 1 TABLET BY MOUTH DAILY WITH LASIX 30 tablet 2  . pravastatin (PRAVACHOL) 80 MG tablet TAKE 1 TABLET BY MOUTH AT BEDTIME 90 tablet 4  . Tiotropium Bromide Monohydrate (SPIRIVA RESPIMAT) 2.5 MCG/ACT AERS Inhale 2 puffs into the lungs daily. 1 Inhaler 5   No current facility-administered medications on file prior to visit.   Allergies  Allergen Reactions  . Ace Inhibitors Other (See Comments)    Hyperkalemia--07/23/2013:patient states not familiar with the following allergy   Social History   Socioeconomic History  . Marital status: Married    Spouse name: Not on file  . Number of children: Not on file  .  Years of education: Not on file  . Highest education level: Not on file  Occupational History  . 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 Smoking status: Former Smoker    Packs/day: 1.50    Years: 60.00    Pack years: 90.00    Types: Cigarettes    Quit date: 12/31/2012    Years since quitting: 6.7  . Smokeless tobacco: Never Used  Substance and Sexual Activity  . Alcohol use: No  . Drug use: No  . Sexual activity: Yes    Birth control/protection: None  Other Topics Concern  . Not on file  Social History Narrative  .  Not on file   Social Determinants of Health   Financial Resource Strain:   . Difficulty of Paying Living Expenses: Not on file  Food Insecurity:   . Worried About Charity fundraiser in the Last Year: Not on file  . Ran Out of Food in the Last Year: Not on file  Transportation Needs: No Transportation Needs  . Lack of Transportation (Medical): No  . Lack of Transportation (Non-Medical): No  Physical Activity: Inactive  . Days of Exercise per Week: 0 days  . Minutes of Exercise per Session: 0 min  Stress: No Stress Concern Present  . Feeling of Stress : Only a little  Social Connections:   . Frequency of Communication with Friends and Family: Not on file  . Frequency of Social Gatherings with Friends and Family: Not on file  . Attends Religious Services: Not on file  . Active Member of Clubs or Organizations: Not on file  . Attends Archivist Meetings: Not on file  . Marital Status: Not on file  Intimate Partner Violence:   . Fear of Current or Ex-Partner: Not on file  . Emotionally Abused: Not on file  . Physically Abused: Not on file  . Sexually Abused: Not on file     Review of Systems  Respiratory: Positive for cough, shortness of breath and wheezing.   All other systems reviewed and are negative.      Objective:   Physical Exam Constitutional:      Appearance: He is obese.  HENT:     Nose:    Cardiovascular:     Rate and Rhythm: Normal rate and regular rhythm.  Pulmonary:     Effort: Pulmonary effort is normal.     Breath sounds: Wheezing present.  Abdominal:     General: Abdomen is flat.     Palpations: Abdomen is soft.  Musculoskeletal:     Right lower leg: No edema.     Left lower leg: No edema.  Neurological:     Mental Status: He is alert.           Assessment & Plan:  Controlled type 2 diabetes mellitus with stage 2 chronic kidney disease, with long-term current use of insulin ([place]) - Plan: Hemoglobin A1c, CBC with Differential, COMPLETE METABOLIC PANEL WITH GFR, Lipid Panel  Paroxysmal atrial fibrillation ([name])  Chronic diastolic heart failure ([name])  Chronic obstructive pulmonary disease, unspecified COPD type ([name])  Facial cellulitis  Treat facial cellulitis doxycycline 100 mg twice daily for 7 days.  Recheck if no better in 7 days or sooner if worsening.  Patient is in normal sinus rhythm today.  He is wheezing but he is at his baseline.  He denies any shortness of breath.  He has not been tachycardia.  His rate is controlled and he is appropriately anticoagulated on Eliquis.  I will check a CBC to rule out anemia.  Patient appears euvolemic.  He has trace edema in both ankles but does not appear fluid overloaded therefore diastolic heart failure appears to be well controlled.  Blood pressures well controlled.  Reported sugars are excellent however I suspect that these fluctuate wildly.  I will check a hemoglobin A1c today.  Goal hemoglobin A1c is less than 7.  Patient is supposed to be on a combination of Dulera and Spiriva however this fluctuates frequently depending on what samples and medication assistance he can get.  He states that he is taking 2 inhalers so therefore I believe the patient  is being compliant and he has no evidence of a COPD exacerbation today.

## 2019-10-13 NOTE — Patient Outreach (Signed)
[place] [facility]) Care Management  10/13/2019  Edward [name] August 18, 1935 [id number]   Unsuccessful outreach call placed to patient in regards to [facility] application for Spiriva.  Unfortunately patient did not answer the phone, a voicemail message was UNABLE to be left as a pre recorded message came on that said  "The person you are trying to reach is not accepting calls at this time please try your call again later"  Also called the number listed for his spouse. However, that number rang continuously for >20 rings with no answer.  Was calling patient because I received his other patient assistance applications, however the application with BI for Spiriva was missing.  Will attempt a 3nd outreach call in 3-7 business days.  [place] P. Edward [name], [name] Management [phone]

## 2019-10-14 ENCOUNTER — Other Ambulatory Visit: Payer: Self-pay | Admitting: Pharmacy Technician

## 2019-10-14 LAB — CBC WITH DIFFERENTIAL/PLATELET
Absolute Monocytes: 582 cells/uL (ref 200–950)
Basophils Absolute: 71 cells/uL (ref 0–200)
Basophils Relative: 1 %
Eosinophils Absolute: 263 cells/uL (ref 15–500)
Eosinophils Relative: 3.7 %
HCT: 38.6 % (ref 38.5–50.0)
Hemoglobin: 13 g/dL — ABNORMAL LOW (ref 13.2–17.1)
Lymphs Abs: 2244 cells/uL (ref 850–3900)
MCH: 29 pg (ref 27.0–33.0)
MCHC: 33.7 g/dL (ref 32.0–36.0)
MCV: 86.2 fL (ref 80.0–100.0)
MPV: 10.8 fL (ref 7.5–12.5)
Monocytes Relative: 8.2 %
Neutro Abs: 3941 cells/uL (ref 1500–7800)
Neutrophils Relative %: 55.5 %
Platelets: 317 10*3/uL (ref 140–400)
RBC: 4.48 10*6/uL (ref 4.20–5.80)
RDW: 13.4 % (ref 11.0–15.0)
Total Lymphocyte: 31.6 %
WBC: 7.1 10*3/uL (ref 3.8–10.8)

## 2019-10-14 LAB — LIPID PANEL
Cholesterol: 203 mg/dL — ABNORMAL HIGH (ref <200)
HDL: 36 mg/dL — ABNORMAL LOW (ref >=40)
Non-HDL Cholesterol (Calc): 167 mg/dL (calc) — ABNORMAL HIGH (ref <130)
Total CHOL/HDL Ratio: 5.6 (calc) — ABNORMAL HIGH (ref <5.0)
Triglycerides: 490 mg/dL — ABNORMAL HIGH (ref <150)

## 2019-10-14 LAB — HEMOGLOBIN A1C
Hgb A1c MFr Bld: 10.5 % of total Hgb — ABNORMAL HIGH (ref <5.7)
Mean Plasma Glucose: 255 (calc)
eAG (mmol/L): 14.1 (calc)

## 2019-10-14 LAB — COMPLETE METABOLIC PANEL WITH GFR
AG Ratio: 1.4 (calc) (ref 1.0–2.5)
ALT: 9 U/L (ref 9–46)
AST: 11 U/L (ref 10–35)
Albumin: 3.6 g/dL (ref 3.6–5.1)
Alkaline phosphatase (APISO): 120 U/L (ref 35–144)
BUN/Creatinine Ratio: 27 (calc) — ABNORMAL HIGH (ref 6–22)
BUN: 28 mg/dL — ABNORMAL HIGH (ref 7–25)
CO2: 29 mmol/L (ref 20–32)
Calcium: 9.2 mg/dL (ref 8.6–10.3)
Chloride: 95 mmol/L — ABNORMAL LOW (ref 98–110)
Creat: 1.03 mg/dL (ref 0.70–1.11)
GFR, Est African American: 78 mL/min/{1.73_m2} (ref >=60)
GFR, Est Non African American: 67 mL/min/{1.73_m2} (ref >=60)
Globulin: 2.6 g/dL (calc) (ref 1.9–3.7)
Glucose, Bld: 229 mg/dL — ABNORMAL HIGH (ref 65–99)
Potassium: 4.4 mmol/L (ref 3.5–5.3)
Sodium: 134 mmol/L — ABNORMAL LOW (ref 135–146)
Total Bilirubin: 0.2 mg/dL (ref 0.2–1.2)
Total Protein: 6.2 g/dL (ref 6.1–8.1)

## 2019-10-14 NOTE — Patient Outreach (Signed)
[place] [facility]) Care Management  10/14/2019  SYMERE MUISE Mar 21, 1935 [id number]                                        Medication Assistance Referral  Referral From: Elsie  Medication/Company: Spiriva Respimat / BI Patient application portion:  Mailed Provider application portion:  N/A Already signed to Dr. Jenna Luo Provider address/fax verified via: Office website  NOTE:Patient previously mailed [place] application on [id number]. However, application failed to come back with the other applications that he mailed back to [facility]. Therefore, we are mailing him another application.   Follow up:  Will follow up with patient in 5-7 business days to confirm application(s) have been received.  Kylee Nardozzi P. Errica Dutil, [place] Management [phone]

## 2019-10-15 ENCOUNTER — Other Ambulatory Visit: Payer: Self-pay | Admitting: Pharmacy Technician

## 2019-10-15 NOTE — Patient Outreach (Signed)
[facility] [facility]) Care Management  10/15/2019  Edward [name] Feb 10, 1935 [id number]    Care coordination call placed to [place] in regards to patient's application for [place].  Spoke to [place] who informed they were missing the patient's proof of income. Informed [name] that we submitted the patient's award letter and a copy of his spouse's social security check as she was unable to locate her letter. [name] was able to find this information while on the line and said she would send it to processing. She informed the applications are processed in order they are received. She could not provide me a timeline of when to call back or if this application would be processed when originally submitted on 09/30/2019 or if they would use today as the submission date. She informed they may kick out the spouse's proof of income and wanted me to be aware as being an invalid form.  Will follow up with [name] in 5-7 business days.  [name] P. Dhani Dannemiller, [place] Management [phone]

## 2019-10-17 ENCOUNTER — Other Ambulatory Visit: Payer: Self-pay | Admitting: Pharmacy Technician

## 2019-10-17 NOTE — Patient Outreach (Signed)
[facility]) Care Management  10/17/2019  Edward [name] 12-20-1934 [id number]   Care coordination call placed to [employer] in regards to patient's application for [facility].  Spoke to [facility] who informed they have not received the application that was mailed to them on 09/30/2019. Edward [name] informed they are running behind on process applications due to delay within the USPS as well as the holidays.  Will follow up with [employer] in 15-20 business days.  Edward Voorheis P. Lucas [name], [place] Management [phone]

## 2019-10-20 ENCOUNTER — Other Ambulatory Visit: Payer: Self-pay | Admitting: Pharmacy Technician

## 2019-10-20 NOTE — Patient Outreach (Signed)
[facility]) Care Management  10/20/2019  Edward [name] 02-Apr-1935 [id number]  Care coordination call placed to [place] in regards to patient's application for [place].  Spoke to [place] who informed they were missing the patient's proof of income. Informed Caren Griffins this was the same thing the representative told me last week. Also informed Caren Griffins that the representative was able to locate the information while I was on the call and now I am checking to see if a determination has been made. Caren Griffins then informed me that she sees the proof of income and it has been attached to his file. She informed that she will have one of her "leads" take a look at the application. She informed to check back in a few days.  Will follow up with [name] in 3-7 business days.  Charonda Hefter P. Sante Biedermann, [place] Management [phone]

## 2019-10-22 ENCOUNTER — Other Ambulatory Visit: Payer: Self-pay | Admitting: Pharmacy Technician

## 2019-10-22 NOTE — Patient Outreach (Signed)
[facility]) Care Management  10/22/2019  Edward [name] 1935-01-25 [id number]  Unsuccessful outreach call placed to patient in regards to [facility] application for Spiriva.  Unfortunately patient did not answer the phone, a voicemail message was UNABLE to be left as a pre recorded message came on that said "the person you are trying to reach is not accepting calls at this time please try your call again later".  Also called the number listed for his spouse. However that number rings continuously with no answer.  Was calling patient because I received his other patient assistance applications, however, the application with BI for Spiriva was missing. Also,need to request update proof of income information   Will attempt to keep reaching out to patient as we are awaiting determination notices on his other patient assistance applications. Will also route note to update [name] Edward [name].  Katreena Schupp P. Trusten Hume, [place] Management [phone]

## 2019-10-23 ENCOUNTER — Telehealth: Payer: Self-pay | Admitting: Pharmacist

## 2019-10-23 NOTE — Patient Outreach (Addendum)
[facility]) Care Management  10/23/2019  Mare SHRIYAN STOIBER 07-12-1935 [id number]   Patient was called to follow up on medication assistance as we will most likely need new financial documents. Also, the [place] application that was sent to him was not returned when the [place] application was sent back.  Unfortunately, the patient's phone was not operational at the time of my call. A message stated the "party was not accepting phone calls".    Plan: Send an unsuccessful Economist. Call patient back in 3-4 weeks.  [name], PharmD, [place] Clinical Pharmacist [phone]

## 2019-10-23 NOTE — Telephone Encounter (Signed)
-----   Message from Jason Fila, CPhT sent at 10/22/2019 11:56 AM EST ----- Hey im not having any luck in contacting this patient after 3 unsuccessful calls. He sent back his [employer]ck and [employer] and I have submitted those but the BI app was NOT with those. [name] may not (prob will not) accept his poi as it is from 2019 ss statement/copy of check stub. He also put 4 people are in his household but only submitted income for 2 people. I will keep trying as we are awaiting determinations with [place] but just wanted u to know. Sharee Pimple

## 2019-10-24 ENCOUNTER — Other Ambulatory Visit: Payer: Self-pay | Admitting: Family Medicine

## 2019-10-24 NOTE — Telephone Encounter (Signed)
Requested Prescriptions   Pending Prescriptions Disp Refills  . LORazepam (ATIVAN) 0.5 MG tablet [Pharmacy Med Name: LORazepam 0.5 MG Oral Tablet] 30 tablet 1    Sig: TAKE 1 TABLET BY MOUTH ONCE DAILY AT BEDTIME AS NEEDED FOR SLEEP    Last OV 10/13/2019   Last written 08/27/2019

## 2019-10-28 ENCOUNTER — Other Ambulatory Visit: Payer: Self-pay | Admitting: Pharmacy Technician

## 2019-10-28 NOTE — Patient Outreach (Addendum)
[place] [facility]) Care Management  10/28/2019  Jordin KEYION EISCHEID 1934-12-20 [id number]   Unsuccessful outreach call placed to patient in regards to [facility] application for Spiriva.  Unfortunately patient did not answer the phone, a pre recorded message came on that said "the person you are trying to reach is not accepting calls at this time please try your call again alter" Also called the number listed for his spouse. However that numbers rings continuously with no answer. Therefore, UNABLE to leave a voicemail message.  Was calling patient because he returned the [facility] application UNSIGNED and did not include his Proof of Income which is required by the patient assistance company. Patient also checked that he has Medicaid and that needs to be verified as well.   Will attempt to keep reaching out to patient as we are awaiting determination notices on his other patient assistance applications. Will route note to update [name] Denyse Amass. Will also re send the application to the patient with a note asking patient to call us.  Tamaria Dunleavy P. Olson Lucarelli, [place] Management [phone]

## 2019-10-29 ENCOUNTER — Other Ambulatory Visit: Payer: Self-pay | Admitting: Pharmacy Technician

## 2019-10-29 NOTE — Patient Outreach (Signed)
[place] [facility]) Care Management  10/29/2019  Arval WHITTAKER HLINKA 1935/02/27 [id number]   Care coordination call placed to [place] in regards to patient's application for [place].  Spoke to [place] who informed the application is missing the proof of income. Informed Shirlean Mylar that she was the 3rd representative that has told me that but that the other 2 reps had found the information and said they would sending it to processing. Shirlean Mylar was able to locate the information. She reached out to a processing tech who informed they would place it on their list to be processed this week.  Will follow up with [name] in 3-5 business days to inquire if a determination has been made.  Shrihan Putt P. Corina Stacy, [place] Management [phone]

## 2019-11-03 ENCOUNTER — Other Ambulatory Visit: Payer: Self-pay | Admitting: Pharmacy Technician

## 2019-11-03 DIAGNOSIS — J449 Chronic obstructive pulmonary disease, unspecified: Secondary | ICD-10-CM | POA: Diagnosis not present

## 2019-11-03 NOTE — Patient Outreach (Signed)
[facility]) Care Management  11/03/2019  Edward [name] [birth year] [id number]  ADDENDUM  Unsuccessful call placed to patient regarding patient assistance medication delivery of Basaglar with [name], NO HIPAA compliant voicemail could be left as phone stated patient not receiving calls at this time and then hang up..   Was calling to inform patient that he was approved for the program and to inquire if he has received the medication. Was also going to provide him with the phone number for [name] as well as inquire about the BI application and update him on the [employer] application.  Follow up:  Have not been able to maintain any contact with patient. Have tried numerous times and with both phone numbers on file.  Will continue to follow up with [employer] as the application was previously submitted.  Antoine Vandermeulen P. Adriana Lina, [place] Management [phone]

## 2019-11-03 NOTE — Patient Outreach (Signed)
[place] [facility]) Care Management  11/03/2019  Sylvan KHAZA [facility] 1935-06-20 [id number]  Care coordination call placed to [place] in regards to patient's application for [place].  Spoke to [place] who informed patient was APPROVED 10/29/2019-10/01/2020. She informed an order was sent to the pharmacy on 10/29/2019 but she has no tracking information. She informed that if the patient needs a refill and has not received his current supply, then he needs to call [name] at [phone].  Will outreach patient with this information.  Tabatha Razzano P. Eimi Viney, [place] Management [phone]

## 2019-11-05 ENCOUNTER — Other Ambulatory Visit: Payer: Self-pay | Admitting: Pharmacy Technician

## 2019-11-05 NOTE — Patient Outreach (Signed)
[facility] [facility]) Care Management  11/05/2019  Ryzen MERYL AGNER Mar 09, 1935 [id number]  Care coordination call placed to [employer] in regards to patient's application for [facility].  Spoke to [place] who informed they have not received the applicaiton that was mailed to them on 09/30/2019.  She informed the patient could re apply or if a copy of the application was made, then it could be mailed to them at [place], [place], [place] [phone] or it could be sent via over night mail to their physical address at [address] floor, [place], [place].  Will follow up with [employer]ck in 7-10 business days.  Taylon Coole P. Almin Livingstone, [place] Management [phone]

## 2019-11-12 ENCOUNTER — Encounter: Payer: Self-pay | Admitting: Family Medicine

## 2019-11-12 ENCOUNTER — Other Ambulatory Visit: Payer: Self-pay | Admitting: Pharmacist

## 2019-11-12 ENCOUNTER — Ambulatory Visit: Payer: Self-pay | Admitting: Pharmacist

## 2019-11-12 NOTE — Patient Outreach (Signed)
[facility] [facility]) Care Management  11/12/2019  Edward [name] October 10, 1934 [id number]   Patient was called regarding medication assistance. Unfortunately, he did not answer the phone. HIPAA compliant message was left on his voicemail.  Jill Simcox, CPhT has attempted to call the patient several times unsuccessfully.  Plan: Call patient back in 3-4 weeks.  [name], PharmD, [place] Clinical Pharmacist [phone]

## 2019-11-14 ENCOUNTER — Other Ambulatory Visit: Payer: Self-pay | Admitting: Pharmacy Technician

## 2019-11-14 NOTE — Patient Outreach (Signed)
[place] [facility]) Care Management  11/14/2019  Edward [name] Oct 15, 1934 [id number]  Care coordination call placed to [employer] in regards to patient's application for [facility].  Spoke to [place] who informed they have not received the application that was mailed to them on 09/30/2019.  Will follow up with [employer]ck in 5-10 business days. Of note, application was re mailed to [employer] the 1st week of February.  Keats Kingry P. Kiyan Burmester, [place] Management [phone]

## 2019-11-17 ENCOUNTER — Other Ambulatory Visit: Payer: Self-pay | Admitting: *Deleted

## 2019-11-17 ENCOUNTER — Other Ambulatory Visit: Payer: Self-pay | Admitting: Family Medicine

## 2019-11-17 NOTE — Patient Outreach (Signed)
[place] [facility]) Care Management  11/17/2019  Edward [name] 07-13-1935 [id number]   [place] Every other Month Outreach  Referral Date:  10/15/2018 Referral Source:  Transfer from [place] Reason for Referral:  Continued Disease Management Education Insurance:  [employer]   Outreach Attempt:  Outreach attempt #1 to patient for follow up.  Phone line stated "the wireless caller you are trying is not available, please try your call again later".  Unable to leave message.  Plan:  RN Health Coach will make another outreach attempt within the month of March.  [place] [phone] Tracyann [email or web address]

## 2019-11-28 ENCOUNTER — Other Ambulatory Visit: Payer: Self-pay | Admitting: Pharmacy Technician

## 2019-11-28 NOTE — Patient Outreach (Signed)
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  Spoke to patient's son [name], [place] identifiers verified.  [name] informed they received the attestation form from [employer]. He informed he knows his dad signed the form but not sure if he answered the question. Informed [name] I would follow up with [employer] in 10-14 business days to inquire if it was received back and find out he next steps involved in the process if he did not complete the form entirely or if he did complete it then I would inquire when the medication may ship. [name] verbalized understanding.  [name] informed they have not received the Basaglar and have not received any UPS shipments. Provided [name] the phone number to [place] to call and schedule his delivery of that medication as he was approved for the program on 10/29/2019.[name] informed they received the BI application for Spiriva but have not mailed it back yet. Informed him that the application along with household proof of income would need to be sent back if they want to proceed with the process. If not back within 15 business days will route note to [place] for case closure.  Will follow up with [employer] in 10-14 business days to confirm receipt of attestation form.  Jerine Surles P. Wilho Sharpley, [place] Management [phone]

## 2019-11-28 NOTE — Patient Outreach (Signed)
[place] [facility]) Care Management  11/28/2019  Edward [name] 06/17/1935 [id number]    Care coordination call placed to [employer] in regards to patient's application for [facility].  Spoke to [place] who informed application was received on 11/20/2019 and mailed to patient on 11/21/2019. Rick informed once attestation form is received back then the application process can continue.  Will outreach patient with this information.  Sabrie Moritz P. Josetta Wigal, [place] Management [phone]

## 2019-12-01 DIAGNOSIS — J449 Chronic obstructive pulmonary disease, unspecified: Secondary | ICD-10-CM | POA: Diagnosis not present

## 2019-12-02 ENCOUNTER — Other Ambulatory Visit: Payer: Self-pay | Admitting: Pharmacy Technician

## 2019-12-02 NOTE — Patient Outreach (Signed)
[facility]) Care Management  12/02/2019  Edward [name] 03-29-1935 [id number]   Received both patient and provider portion(s) of patient assistance application(s) for Spiriva Respimat. Faxed completed application and required documents into Bi.  Will follow up with company(ies) in 7-10 business days to check status of application(s).  [name] P. [name], [place]  [phone]

## 2019-12-10 ENCOUNTER — Other Ambulatory Visit: Payer: Self-pay | Admitting: Pharmacy Technician

## 2019-12-10 NOTE — Patient Outreach (Signed)
[facility]) Care Management  12/10/2019  Edward [name] Feb 08, 1935 [id number]  Care coordination call placed to [employer] in regards to patient's application for [facility].  Spoke to [place] who informs they have not received the attestation form back that the patient informs he mailed back around the end of February.  Will follow up with [employer] in 10-15 business days.  Edward [name], [place]  [phone]

## 2019-12-16 ENCOUNTER — Other Ambulatory Visit: Payer: Self-pay | Admitting: "Endocrinology

## 2019-12-16 ENCOUNTER — Other Ambulatory Visit: Payer: Self-pay | Admitting: Pharmacy Technician

## 2019-12-16 NOTE — Patient Outreach (Signed)
[place] [facility]) Care Management  12/16/2019  Edward [name] [birth year] [id number]   Care coordination call placed to BI in regards to [place] application.  Spoke to [place] who informed patient was temporarily approved on 12/11/2019. They informed patient would have to apply for LIS. If denied, patient would need to send in that letter. If patient is receiving partial LIS then patient would need to send in a copy of that letter along with proof of the copay of the Spiriva Respimat. They informed that a 90 days supply of the  medication should be delivered by 9pm today via regular USPS mail.  Will outreach patient with this information.  Rachella Basden P. Latangela Mccomas, [place]  [phone]

## 2019-12-16 NOTE — Patient Outreach (Signed)
[facility]) Care Management  12/16/2019  Petra TYVEON STAUDACHER 1935-01-10 [id number]   ADDENDUM  Successful outreach call placed to patient in regards to [facility] application for Spiriva Respimat.  Spoke to patient's son [name] as patient was asleep. HIPAA identifiers confirmed and verified.  Informed [name] that his father's Spiriva application was temporarily approved pending documentation of whether patient has LIS or Extra Help. It has been documented that patient has partial help. Inquired if [name] knew if his parents still had a copy of the approval letter from [facility] indicating they had partial subsidy. [name] was unsure and said he would ask his father when he wakes up and call me back. [name] confirmed having my phone number as it showed up on caller ID. Also informed [name] that in the meantime his father would receive a 1 time shipment of 3 Spiriva Respimat inhalers and that it should be arriving today if not this week via regular mail. [name] informed they have not received it yet. Informed [name] no more medication would be sent from the patient assistance company until they receive the necessary documents they require. [name] verbalized understanding.  Will follow up with patient in 5-7 business days if call is not returned.  Kenadi Miltner P. Amy Gothard, [place]  [phone]

## 2019-12-17 ENCOUNTER — Other Ambulatory Visit: Payer: Self-pay | Admitting: Pharmacy Technician

## 2019-12-17 NOTE — Patient Outreach (Signed)
[facility] [facility]) Care Management  12/17/2019  Edward [name] 15-Dec-1934 [id number]   Care coordination call placed to [employer] in regards to patient's application for [facility].  Spoke to [address] who informed that they have not received or processed an attestation form for the patient despite the patient having mailed it back to them on 11/27/2019. She suggested calling back next week.  Will follow up with [employer] in 5-7 business days as the attestation form has to be received by [employer] on or before 12/31/2019 for patient to be approved in the program as [employer] is spinning off [employer] to another company.  [name] P. Ediberto Sens, [place]  [phone]

## 2019-12-19 ENCOUNTER — Other Ambulatory Visit: Payer: Self-pay | Admitting: Family Medicine

## 2019-12-19 ENCOUNTER — Other Ambulatory Visit: Payer: Self-pay | Admitting: Pharmacist

## 2019-12-19 DIAGNOSIS — R600 Localized edema: Secondary | ICD-10-CM

## 2019-12-19 DIAGNOSIS — Z09 Encounter for follow-up examination after completed treatment for conditions other than malignant neoplasm: Secondary | ICD-10-CM

## 2019-12-19 DIAGNOSIS — I5032 Chronic diastolic (congestive) heart failure: Secondary | ICD-10-CM

## 2019-12-19 NOTE — Patient Outreach (Signed)
[place] [facility]) Care Management  12/19/2019  Tajae DIMARI CONFER July 19, 1935 [id number]   Patient's case is being closed as the [place] Team has transitioned to the Quality Department and will no longer document in CHL.  Patient will still be followed for medication assistance by [place] Technician.   [name], PharmD, [place] Clinical Pharmacist [phone]

## 2019-12-22 ENCOUNTER — Other Ambulatory Visit: Payer: Self-pay | Admitting: *Deleted

## 2019-12-22 NOTE — Patient Outreach (Signed)
[place] [facility]) Care Management  12/22/2019  VIR HEEB [birth year] [id number]   [place] Every other Month Outreach  Referral Date: 10/15/2018 Referral Source: Transfer from [place] Reason for Referral: Continued Disease Management Education Insurance:[employer] Medicare   Outreach Attempt:  Outreach attempt #2 to patient for follow up.  Line went straight to "the person you are trying to reach is not accepting calls at this time".  Unable to leave voice message.  Plan:  RN Health Coach will make another outreach attempt within the month of April.  [place] [phone] [email or web address]

## 2019-12-24 ENCOUNTER — Other Ambulatory Visit: Payer: Self-pay | Admitting: Pharmacy Technician

## 2019-12-24 ENCOUNTER — Ambulatory Visit: Payer: Self-pay | Admitting: Pharmacist

## 2019-12-24 NOTE — Patient Outreach (Signed)
[facility]) Care Management  12/24/2019  Bain YAREL DILDY 31-Aug-1935 [id number]   Unsuccessful outreach call placed to patient in regards to [employer] application for [employer].  Unfortunately patient did not answer the phone, a HIPAA compliant voicemail was NOT able to be left as a pre recorded message came up on that said "The person you are trying to reach is not accepting calls at this time pleas try your call again later."  Will attempt another outreach call in 2-5 business days.  Taishaun Levels P. Mazi Brailsford, [place]  [phone]

## 2019-12-24 NOTE — Patient Outreach (Signed)
[facility] [facility]) Care Management  12/24/2019  Gumaro DAYVEON BULMAN [birth year] [id number]   Care coordination call placed to [employer] in regards to patient's application for [facility].  Spoke to [place] who informed they received the application back on [id number] but it did NOT contain the attestation form. She also informed that a 2nd application was received on 3/12 and they mailed another attestation form out with the 2nd application on [id number]. IF the attestation form is received back to them by 12/31/2019 then patient will be approved for the program. However, if received after 12/31/2019 then patient will not be approved as [employer] is ending the [employer].  Will outreach patient with this information.  Kathyann Spaugh P. Tameyah Koch, [place]  [phone]

## 2019-12-26 ENCOUNTER — Other Ambulatory Visit: Payer: Self-pay | Admitting: Pharmacy Technician

## 2019-12-26 NOTE — Patient Outreach (Signed)
[place] [facility]) Care Management  12/26/2019  Edward [name] November 14, 1934 [id number]  Care coordination call placed to BI in regards to Spiriva application.  Spoke to [place] who informed the medication shipped out on 12/11/2019 and was delivered on 12/16/2019 to the mailbox of patient's home address.  Will outreach patient with this information.[name] [name], [place]  [phone]

## 2019-12-26 NOTE — Patient Outreach (Signed)
[facility]) Care Management  12/26/2019  Edward [name] May 15, 1935 [id number]  ADDENDUM  Unsuccessful care coordination call placed to patient in regards to [facility] application for [place] and [employer] application for [employer].  Unfortunately patient did not answer the phone, a HIPAA compliant message WAS NOT ABLE to be left as a pre recorded message came on stating "there person yo are trying to reach is not accepting calls at this time please try your call again later". Also attempted to call the number for his spouse but that number just rings continuously and no one answers it.  Was attempting to follow up with patient to inquire and confirm he received the initial Spiriva shipment. Also wanted to inquire if he had found his LIS letter as that is required from [facility] in order to continue to send out medication. Lastly, wanted to confirm if he received the 2nd attestation form and/or still had the 1st attestation form for the [employer] application for [employer].  Will attempt one more follow up call with the patient in 2-3 business days as per workflow.  [name], [name]  [phone]

## 2019-12-27 ENCOUNTER — Emergency Department (HOSPITAL_COMMUNITY): Payer: Medicare Other

## 2019-12-27 ENCOUNTER — Emergency Department (HOSPITAL_COMMUNITY)
Admission: EM | Admit: 2019-12-27 | Discharge: 2019-12-28 | Disposition: A | Discharge status: Home / Self Care | Payer: Medicare Other | Attending: Emergency Medicine | Admitting: Emergency Medicine

## 2019-12-27 ENCOUNTER — Other Ambulatory Visit: Payer: Self-pay

## 2019-12-27 ENCOUNTER — Encounter (HOSPITAL_COMMUNITY): Payer: Self-pay | Admitting: Emergency Medicine

## 2019-12-27 DIAGNOSIS — Z79899 Other long term (current) drug therapy: Secondary | ICD-10-CM | POA: Diagnosis not present

## 2019-12-27 DIAGNOSIS — J441 Chronic obstructive pulmonary disease with (acute) exacerbation: Secondary | ICD-10-CM | POA: Diagnosis not present

## 2019-12-27 DIAGNOSIS — Z20822 Contact with and (suspected) exposure to covid-19: Secondary | ICD-10-CM | POA: Insufficient documentation

## 2019-12-27 DIAGNOSIS — I130 Hypertensive heart and chronic kidney disease with heart failure and stage 1 through stage 4 chronic kidney disease, or unspecified chronic kidney disease: Secondary | ICD-10-CM | POA: Diagnosis not present

## 2019-12-27 DIAGNOSIS — Z7982 Long term (current) use of aspirin: Secondary | ICD-10-CM | POA: Insufficient documentation

## 2019-12-27 DIAGNOSIS — E1122 Type 2 diabetes mellitus with diabetic chronic kidney disease: Secondary | ICD-10-CM | POA: Insufficient documentation

## 2019-12-27 DIAGNOSIS — I5030 Unspecified diastolic (congestive) heart failure: Secondary | ICD-10-CM | POA: Insufficient documentation

## 2019-12-27 DIAGNOSIS — R0602 Shortness of breath: Secondary | ICD-10-CM | POA: Diagnosis not present

## 2019-12-27 DIAGNOSIS — N183 Chronic kidney disease, stage 3 unspecified: Secondary | ICD-10-CM | POA: Diagnosis not present

## 2019-12-27 DIAGNOSIS — R918 Other nonspecific abnormal finding of lung field: Secondary | ICD-10-CM | POA: Diagnosis not present

## 2019-12-27 LAB — CBC WITH DIFFERENTIAL/PLATELET
Abs Immature Granulocytes: 0.02 10*3/uL (ref 0.00–0.07)
Basophils Absolute: 0.1 10*3/uL (ref 0.0–0.1)
Basophils Relative: 1 %
Eosinophils Absolute: 1 10*3/uL — ABNORMAL HIGH (ref 0.0–0.5)
Eosinophils Relative: 13 %
HCT: 38 % — ABNORMAL LOW (ref 39.0–52.0)
Hemoglobin: 12.5 g/dL — ABNORMAL LOW (ref 13.0–17.0)
Immature Granulocytes: 0 %
Lymphocytes Relative: 22 %
Lymphs Abs: 1.8 10*3/uL (ref 0.7–4.0)
MCH: 29.6 pg (ref 26.0–34.0)
MCHC: 32.9 g/dL (ref 30.0–36.0)
MCV: 90 fL (ref 80.0–100.0)
Monocytes Absolute: 0.6 10*3/uL (ref 0.1–1.0)
Monocytes Relative: 8 %
Neutro Abs: 4.4 10*3/uL (ref 1.7–7.7)
Neutrophils Relative %: 56 %
Platelets: 268 10*3/uL (ref 150–400)
RBC: 4.22 MIL/uL (ref 4.22–5.81)
RDW: 12.8 % (ref 11.5–15.5)
WBC: 7.9 10*3/uL (ref 4.0–10.5)
nRBC: 0 % (ref 0.0–0.2)

## 2019-12-27 LAB — BASIC METABOLIC PANEL
Anion gap: 9 (ref 5–15)
BUN: 27 mg/dL — ABNORMAL HIGH (ref 8–23)
CO2: 30 mmol/L (ref 22–32)
Calcium: 8.9 mg/dL (ref 8.9–10.3)
Chloride: 89 mmol/L — ABNORMAL LOW (ref 98–111)
Creatinine, Ser: 1.07 mg/dL (ref 0.61–1.24)
GFR calc Af Amer: >60 mL/min (ref >60)
GFR calc non Af Amer: >60 mL/min (ref >60)
Glucose, Bld: 262 mg/dL — ABNORMAL HIGH (ref 70–99)
Potassium: 4.4 mmol/L (ref 3.5–5.1)
Sodium: 128 mmol/L — ABNORMAL LOW (ref 135–145)

## 2019-12-27 LAB — BRAIN NATRIURETIC PEPTIDE: B Natriuretic Peptide: 68 pg/mL (ref 0.0–100.0)

## 2019-12-27 LAB — RESPIRATORY PANEL BY RT PCR (FLU A&B, COVID)
Influenza A by PCR: NEGATIVE
Influenza B by PCR: NEGATIVE
SARS Coronavirus 2 by RT PCR: NEGATIVE

## 2019-12-27 LAB — POC SARS CORONAVIRUS 2 AG -  ED: SARS Coronavirus 2 Ag: NEGATIVE

## 2019-12-27 MED ORDER — ALBUTEROL SULFATE (2.5 MG/3ML) 0.083% IN NEBU: 3.0000 mL | INHALATION_SOLUTION | RESPIRATORY_TRACT | Status: DC | PRN

## 2019-12-27 MED ORDER — ALBUTEROL SULFATE HFA 108 (90 BASE) MCG/ACT IN AERS
INHALATION_SPRAY | RESPIRATORY_TRACT | Status: AC
  Administered 2019-12-27: 8
  Filled 2019-12-27: qty 6.7

## 2019-12-27 MED ORDER — IPRATROPIUM-ALBUTEROL 0.5-2.5 (3) MG/3ML IN SOLN
3.0000 mL | Freq: Once | RESPIRATORY_TRACT | Status: AC
  Administered 2019-12-27: 3 mL via RESPIRATORY_TRACT
  Filled 2019-12-27: qty 3

## 2019-12-27 MED ORDER — PREDNISONE 20 MG PO TABS: 40.0000 mg | ORAL_TABLET | Freq: Every day | ORAL | 0 refills | Status: DC

## 2019-12-27 MED ORDER — ALBUTEROL SULFATE HFA 108 (90 BASE) MCG/ACT IN AERS
4.0000 | INHALATION_SPRAY | RESPIRATORY_TRACT | Status: DC | PRN
  Administered 2019-12-27: 4 via RESPIRATORY_TRACT

## 2019-12-27 MED ORDER — METHYLPREDNISOLONE SODIUM SUCC 125 MG IJ SOLR
125.0000 mg | Freq: Once | INTRAMUSCULAR | Status: AC
  Administered 2019-12-27: 125 mg via INTRAVENOUS
  Filled 2019-12-27: qty 2

## 2019-12-27 MED ORDER — AZITHROMYCIN 250 MG PO TABS: 250.0000 mg | ORAL_TABLET | Freq: Every day | ORAL | 0 refills | Status: DC

## 2019-12-27 NOTE — ED Notes (Signed)
Negative covid nebulizer given

## 2019-12-27 NOTE — ED Notes (Addendum)
Patient appears to be improving 2nd inhaler dose given.

## 2019-12-27 NOTE — ED Triage Notes (Signed)
Patient c/o shortness of breath x1 week that is progressively getting worse. Patient has occasional productive cough with thick clear-yellow sputum. Denies any fevers or chest pain. Per patient hx of COPD in which he uses nebs, inhaler, and oxygen. Per patient used neb and inhaler prior to coming to ED with no relief. Patient states wears 3L of O2 at all times but did not bring oxygen tank to ED.

## 2019-12-27 NOTE — ED Provider Notes (Signed)
[facility] EMERGENCY DEPARTMENT Provider Note   CSN: [id number] Arrival date & time: 12/27/19  1847     History Chief Complaint  Patient presents with  . Shortness of Breath    Edward [name] is a 85 y.o. male.  HPI   85 year old male with dyspnea.  Acute on chronic.  Worsening over the last week or so.  He has underlying history of COPD and is chronically on 3 L of oxygen at baseline.  He has had increased shortness of breath and a productive cough.  He is not getting the same relief from his bronchodilators as he typically does.  Denies any acute pain.  Denies any unusual leg pain or swelling.  No fevers or chills.  No GI symptoms.  No sick contacts that he is aware of.  Past Medical History:  Diagnosis Date  . Allergy    Rhinitis  . Atrial fibrillation ([place])   . Bronchitis   . Chronic respiratory failure ([place])   . Colon polyps   . COPD (chronic obstructive pulmonary disease) ([place])   . Diabetes mellitus   . Elevated lipids   . Hypercholesterolemia   . Hypertension   . Iron deficiency anemia due to chronic blood loss 04/30/2018  . Noncompliance   . On home O2    2L N/C   . PSA elevation   . Pulmonary fibrosis ([place])   . Vitamin D deficiency     Patient Active Problem List   Diagnosis Date Noted  . Mixed hyperlipidemia 03/06/2019  . Parotitis 11/01/2018  . Syncope 10/30/2018  . Facial cellulitis 10/30/2018  . Chronic respiratory failure with hypoxia ([place]) 10/30/2018  . Chronic diastolic heart failure ([place])   . Hypoxia   . Atrial fibrillation ([place])   . COPD with acute exacerbation ([place]) 07/31/2018  . Hyponatremia 07/31/2018  . COPD (chronic obstructive pulmonary disease) ([place]) 05/21/2018  . Stage 3 chronic kidney disease 05/21/2018  . Diastolic congestive heart failure ([place]) 05/21/2018  . Type 2 diabetes mellitus ([place]) 05/21/2018  . HTN (hypertension) 05/21/2018  . Iron deficiency anemia due to chronic blood loss 04/30/2018  . Respiratory distress   .  Hematochezia   . Goals of care, counseling/discussion   . Palliative care by specialist   . DNR (do not resuscitate) discussion   . Acute renal failure with acute tubular necrosis superimposed on stage 3 chronic kidney disease ([place])   . Palliative care encounter   . COPD exacerbation ([name]) 04/17/2018  . Acute on chronic diastolic CHF (congestive heart failure) ([place])   . Severe anemia 02/17/2018  . Bradycardia 02/17/2018  . Hypothermia   . Gastroesophageal reflux disease   . Acute encephalopathy 09/25/2017  . On home O2 09/25/2017  . Bilateral lower extremity edema 06/27/2017  . Insomnia 03/28/2017  . CKD (chronic kidney disease), stage III 03/07/2017  . AF (paroxysmal atrial fibrillation) ([place]) 03/05/2017  . Chronic diastolic CHF (congestive heart failure) ([place]) 03/04/2017  . HCAP (healthcare-associated pneumonia) 03/04/2017  . Sepsis due to pneumonia ([place]) 02/25/2017  . Constipation 02/25/2017  . Overflow diarrhea/Constipation 02/25/2017  . DM type 2 causing vascular disease ([place]) 10/11/2016  . Non compliance w medication regimen 12/02/2015  . Hypercholesterolemia 05/12/2014  . Acute on chronic respiratory failure with hypoxemia ([place]) 11/17/2013  . Elevated PSA 01/20/2013  . Hypertension   . Elevated lipids   . Pulmonary fibrosis ([place])   . Colon polyps   . Colon polyps     Past Surgical History:  Procedure Laterality  Date  . BIOPSY  04/23/2018   Procedure: BIOPSY;  Surgeon: Danie Binder, MD;  Location: [facility] ENDO SUITE;  Service: Endoscopy;;  duodenum gastric  . CATARACT EXTRACTION W/PHACO  06/25/2012   Procedure: CATARACT EXTRACTION PHACO AND INTRAOCULAR LENS PLACEMENT (IOC);  Surgeon: Elta Guadeloupe T. Gershon Crane, MD;  Location: [facility] ORS;  Service: Ophthalmology;  Laterality: Left;  CDE=19.01  . CATARACT EXTRACTION W/PHACO  07/09/2012   Procedure: CATARACT EXTRACTION PHACO AND INTRAOCULAR LENS PLACEMENT (IOC);  Surgeon: Elta Guadeloupe T. Gershon Crane, MD;  Location: [facility] ORS;  Service: Ophthalmology;   Laterality: Right;  CDE: 20.09  . COLONOSCOPY WITH PROPOFOL N/A 04/23/2018   Procedure: COLONOSCOPY WITH PROPOFOL;  Surgeon: Danie Binder, MD;  Location: [facility] ENDO SUITE;  Service: Endoscopy;  Laterality: N/A;  . ESOPHAGOGASTRODUODENOSCOPY (EGD) WITH PROPOFOL N/A 04/23/2018   Procedure: ESOPHAGOGASTRODUODENOSCOPY (EGD) WITH PROPOFOL;  Surgeon: Danie Binder, MD;  Location: [facility] ENDO SUITE;  Service: Endoscopy;  Laterality: N/A;      Family History  Problem Relation Age of Onset  . Heart disease Mother   . CAD Other   . Diabetes Other     Social History   Tobacco Use  . Smoking status: Former Smoker    Packs/day: 1.50    Years: 60.00    Pack years: 90.00    Types: Cigarettes    Quit date: 12/31/2012    Years since quitting: 6.9  . Smokeless tobacco: Never Used  Substance Use Topics  . Alcohol use: No  . Drug use: No    Home Medications Prior to Admission medications   Medication Sig Start Date End Date Taking? Authorizing Provider  albuterol (PROVENTIL) (2.5 MG/3ML) 0.083% nebulizer solution INHALE 1 VIAL VIA NEBULIZER EVERY 6 HOURS AS NEEDED FOR WHEEZING OR SHORTNESS OF BREATH 08/12/19   Susy Frizzle, MD  aspirin EC 81 MG tablet Take 1 tablet (81 mg total) by mouth daily. Resume on 7/24 04/23/18   Kathie Dike, MD  cloNIDine (CATAPRES) 0.1 MG tablet TAKE 1 TABLET BY MOUTH 2 TIMES A DAY 07/11/19   Susy Frizzle, MD  diltiazem (CARDIZEM CD) 240 MG 24 hr capsule TAKE 1 CAPSULE BY MOUTH DAILY 10/30/18   Susy Frizzle, MD  doxycycline (VIBRA-TABS) 100 MG tablet Take 1 tablet (100 mg total) by mouth 2 (two) times daily. 10/13/19   Susy Frizzle, MD  ELIQUIS 5 MG TABS tablet Take 1 tablet by mouth twice daily 10/24/19   Susy Frizzle, MD  furosemide (LASIX) 40 MG tablet TAKE 1 TABLET BY MOUTH DAILY 12/22/19   Susy Frizzle, MD  Insulin Glargine (BASAGLAR KWIKPEN) 100 UNIT/ML SOPN Inject 0.3 mLs (30 Units total) into the skin at bedtime. 07/21/19   Susy Frizzle, MD  Insulin Pen Needle (PEN NEEDLES) 31G X 6 MM MISC Use with insulin qd 10/13/19   Jenna Luo T, MD  ipratropium (ATROVENT) 0.02 % nebulizer solution INHALE THE CONTENTS OF 1 VIAL VIA NEBULIZATION 4 TIMES DAILY 05/16/19   Susy Frizzle, MD  Lancets [facility] DELICA PLUS [id number]) MISC USE TO CHECK BLOOD SUGAR TWICE DAILY AS DIRECTED 01/30/19   Susy Frizzle, MD  LORazepam (ATIVAN) 0.5 MG tablet TAKE 1 TABLET BY MOUTH ONCE DAILY AT BEDTIME AS NEEDED FOR SLEEP 10/27/19   Susy Frizzle, MD  metFORMIN (GLUCOPHAGE) 500 MG tablet TAKE 1 TABLET BY MOUTH TWICE DAILY WITH MEALS 12/16/19   [name], [name], MD  metoprolol tartrate (LOPRESSOR) 25 MG tablet TAKE 1 TABLET BY  MOUTH TWICE DAILY 07/11/19   Susy Frizzle, MD  mometasone-formoterol [facility]) 200-5 MCG/ACT AERO Inhale 2 puffs into the lungs 2 (two) times daily. 11/01/18   Susy Frizzle, MD  ONETOUCH ULTRA test strip CHECK FASTING BLOOD SUGAR TWICE DAILY 12/22/19   Susy Frizzle, MD  OXYGEN Inhale 3 L into the lungs continuous.     [provider]  polyethylene glycol (MIRALAX / GLYCOLAX) packet Take 17 g by mouth daily as needed for mild constipation or moderate constipation. 10/06/18   Johnson, Clanford L, MD  potassium chloride SA (KLOR-CON) 20 MEQ tablet TAKE 1 TABLET BY MOUTH DAILY WITH LASIX 07/11/19   Susy Frizzle, MD  pravastatin (PRAVACHOL) 80 MG tablet TAKE 1 TABLET BY MOUTH AT BEDTIME 11/17/19   Susy Frizzle, MD  Tiotropium Bromide Monohydrate (SPIRIVA RESPIMAT) 2.5 MCG/ACT AERS Inhale 2 puffs into the lungs daily. 11/01/18   Susy Frizzle, MD    Allergies    Ace inhibitors  Review of Systems   Review of Systems All systems reviewed and negative, other than as noted in HPI.  Physical Exam Updated Vital Signs BP (!) 136/98 (BP Location: Right Arm)   Pulse (!) 106   Temp 98.2 F (36.8 C) (Oral)   Resp (!) 26   Ht 5\' 6"  (1.676 m)   Wt 79.4 kg   SpO2 98%   BMI 28.25 kg/m    Physical Exam Vitals and nursing note reviewed.  Constitutional:      General: He is not in acute distress.    Appearance: He is well-developed.  HENT:     Head: Normocephalic and atraumatic.  Eyes:     General:        Right eye: No discharge.        Left eye: No discharge.     Conjunctiva/sclera: Conjunctivae normal.  Cardiovascular:     Rate and Rhythm: Regular rhythm. Tachycardia present.     Heart sounds: Normal heart sounds. No murmur. No friction rub. No gallop.   Pulmonary:     Effort: No respiratory distress.     Breath sounds: Wheezing present.     Comments: Mild tachypnea.  Prolonged expiratory phase.  Wheezing bilaterally. Abdominal:     General: There is no distension.     Palpations: Abdomen is soft.     Tenderness: There is no abdominal tenderness.  Musculoskeletal:        General: No tenderness.     Cervical back: Neck supple.     Comments: Lower extremities symmetric as compared to each other. No calf tenderness. Negative Homan's. No palpable cords.   Skin:    General: Skin is warm and dry.  Neurological:     Mental Status: He is alert.  Psychiatric:        Behavior: Behavior normal.        Thought Content: Thought content normal.     ED Results / Procedures / Treatments   Labs (all labs ordered are listed, but only abnormal results are displayed) Labs Reviewed  CBC WITH DIFFERENTIAL/PLATELET - Abnormal; Notable for the following components:      Result Value   Hemoglobin 12.5 (*)    HCT 38.0 (*)    Eosinophils Absolute 1.0 (*)    All other components within normal limits  BASIC METABOLIC PANEL - Abnormal; Notable for the following components:   Sodium 128 (*)    Chloride 89 (*)    Glucose, Bld 262 (*)  BUN 27 (*)    All other components within normal limits  RESPIRATORY PANEL BY RT PCR (FLU A&B, COVID)  BRAIN NATRIURETIC PEPTIDE  POC SARS CORONAVIRUS 2 AG -  ED    EKG None  Radiology DG Chest Portable 1 View  Result Date:  12/27/2019 CLINICAL DATA:  Dyspnea EXAM: PORTABLE CHEST 1 VIEW COMPARISON:  Chest radiograph dated 10/30/2018 FINDINGS: The heart size is normal. Vascular calcifications are seen in the aortic arch. Mild-to-moderate patchy bilateral interstitial and airspace opacities are noted. There is no pleural effusion or pneumothorax. The visualized skeletal structures are unremarkable. IMPRESSION: Mild-to-moderate patchy bilateral interstitial and airspace opacities may represent pulmonary edema and/or atypical pneumonia such as COVID-19. Aortic Atherosclerosis (ICD10-I70.0). Electronically Signed   By: Zerita Boers M.D.   On: 12/27/2019 19:29    Procedures Procedures (including critical care time)  Medications Ordered in ED Medications  ipratropium-albuterol (DUONEB) 0.5-2.5 (3) MG/3ML nebulizer solution 3 mL (0 mLs Nebulization Hold 12/27/19 1928)  methylPREDNISolone sodium succinate (SOLU-MEDROL) 125 mg/2 mL injection 125 mg (125 mg Intravenous Given 12/27/19 1945)  albuterol (VENTOLIN HFA) 108 (90 Base) MCG/ACT inhaler (8 puffs  Given 12/27/19 1924)    ED Course  I have reviewed the triage vital signs and the nursing notes.  Pertinent labs & imaging results that were available during my care of the patient were reviewed by me and considered in my medical decision making (see chart for details).    MDM Rules/Calculators/A&P                     85 year old male with COPD exacerbation.  Treated with steroids and bronchodilators here in the emergency room.  Currently feeling better.  O2 sats are fine on his baseline oxygen requirement.  I think he is appropriate for further outpatient management.  Return precautions were discussed.  Final Clinical Impression(s) / ED Diagnoses Final diagnoses:  COPD exacerbation [facility])    Rx / DC Orders ED Discharge Orders    None       Virgel Manifold, MD 12/29/19 1605

## 2019-12-29 ENCOUNTER — Encounter: Payer: Self-pay | Admitting: Family Medicine

## 2019-12-29 ENCOUNTER — Ambulatory Visit (INDEPENDENT_AMBULATORY_CARE_PROVIDER_SITE_OTHER): Payer: Medicare Other | Admitting: Family Medicine

## 2019-12-29 ENCOUNTER — Other Ambulatory Visit: Payer: Self-pay

## 2019-12-29 VITALS — BP 100/60 | HR 108 | Temp 98.3°F | Resp 20 | Ht 65.0 in | Wt 164.0 lb

## 2019-12-29 DIAGNOSIS — J441 Chronic obstructive pulmonary disease with (acute) exacerbation: Secondary | ICD-10-CM | POA: Diagnosis not present

## 2019-12-29 MED ORDER — METHYLPREDNISOLONE ACETATE 80 MG/ML IJ SUSP
80.0000 mg | Freq: Once | INTRAMUSCULAR | Status: AC
  Administered 2019-12-29: 80 mg via INTRAMUSCULAR

## 2019-12-29 NOTE — Progress Notes (Signed)
Subjective:    Patient ID: CAVELL CANCEL, male    DOB: 11-10-1934, 85 y.o.   MRN: [id number]  HPI  Was seen in ER 3/27.  CXR showed: Mild-to-moderate patchy bilateral interstitial and airspace opacities may represent pulmonary edema and/or atypical pneumonia such as COVID-19. COVID and Influenza tests were negative.  BNP was 68.   Patient was started on a Z-Pak and was given 40 mg of prednisone daily.  He is here today for follow-up.  He is 90% on 3 L.  He has diffuse expiratory wheezing.  Patient has very poor air movement in all 4 lung fields.  He has rhonchorous breath sounds.  He reports feeling tight in his lungs and short of breath.  He continues to cough.  He does not feel much better from when he went to the hospital.  He denies any hemoptysis.  He denies any chest pain.  He denies any pleurisy.  He denies any fever.  Patient was given a DuoNeb upon arrival.  Pulse oximetry improved to 92% on 3 L and breathing improved.  When I questioned the patient as to what he is doing at home, he has not been taking his Dulera.  Instead he has been using "the twister".  I am assuming this is a sample of Stiolto that he was able to get from one of the pharmacy assistance programs.  He also reports using his albuterol inhaler every 3-4 hours in addition to albuterol nebulizer treatments every 3-4 hours.  He denies any palpitations or tachycardia.  He denies any purulent sputum.   Past Medical History:  Diagnosis Date  . Allergy    Rhinitis  . Atrial fibrillation ([place])   . Bronchitis   . Chronic respiratory failure ([place])   . Colon polyps   . COPD (chronic obstructive pulmonary disease) ([place])   . Diabetes mellitus   . Elevated lipids   . Hypercholesterolemia   . Hypertension   . Iron deficiency anemia due to chronic blood loss 04/30/2018  . Noncompliance   . On home O2    2L N/C   . PSA elevation   . Pulmonary fibrosis ([place])   . Vitamin D deficiency    Past Surgical History:  Procedure  Laterality Date  . BIOPSY  04/23/2018   Procedure: BIOPSY;  Surgeon: Danie Binder, MD;  [facility];  Service: Endoscopy;;  duodenum gastric  . CATARACT EXTRACTION W/PHACO  06/25/2012   Procedure: CATARACT EXTRACTION PHACO AND INTRAOCULAR LENS PLACEMENT (IOC);  Surgeon: Elta Guadeloupe T. Gershon Crane, MD;  Location: [facility];  Service: Ophthalmology;  Laterality: Left;  CDE=19.01  . CATARACT EXTRACTION W/PHACO  07/09/2012   Procedure: CATARACT EXTRACTION PHACO AND INTRAOCULAR LENS PLACEMENT (IOC);  Surgeon: Elta Guadeloupe T. Gershon Crane, MD;  Location: [facility];  Service: Ophthalmology;  Laterality: Right;  CDE: 20.09  . COLONOSCOPY WITH PROPOFOL N/A 04/23/2018   Procedure: COLONOSCOPY WITH PROPOFOL;  Surgeon: Danie Binder, MD;  [facility];  Service: Endoscopy;  Laterality: N/A;  . ESOPHAGOGASTRODUODENOSCOPY (EGD) WITH PROPOFOL N/A 04/23/2018   Procedure: ESOPHAGOGASTRODUODENOSCOPY (EGD) WITH PROPOFOL;  Surgeon: Danie Binder, MD;  [facility];  Service: Endoscopy;  Laterality: N/A;   Current Outpatient Medications on File Prior to Visit  Medication Sig Dispense Refill  . albuterol (PROVENTIL) (2.5 MG/3ML) 0.083% nebulizer solution INHALE 1 VIAL VIA NEBULIZER EVERY 6 HOURS AS NEEDED FOR WHEEZING OR SHORTNESS OF BREATH 360 mL 4  . azithromycin (ZITHROMAX) 250 MG tablet Take  1 tablet (250 mg total) by mouth daily. Take first 2 tablets together, then 1 every day until finished. 6 tablet 0  . cloNIDine (CATAPRES) 0.1 MG tablet TAKE 1 TABLET BY MOUTH 2 TIMES A DAY 60 tablet 3  . ELIQUIS 5 MG TABS tablet Take 1 tablet by mouth twice daily 60 tablet 3  . furosemide (LASIX) 40 MG tablet TAKE 1 TABLET BY MOUTH DAILY 30 tablet 3  . Insulin Glargine (BASAGLAR KWIKPEN) 100 UNIT/ML SOPN Inject 0.3 mLs (30 Units total) into the skin at bedtime. 10 pen 3  . Insulin Pen Needle (PEN NEEDLES) 31G X 6 MM MISC Use with insulin qd 100 each 2  . ipratropium (ATROVENT) 0.02 % nebulizer solution INHALE THE  CONTENTS OF 1 VIAL VIA NEBULIZATION 4 TIMES DAILY 75 mL 11  . Lancets (ONETOUCH DELICA PLUS 123XX123) MISC USE TO CHECK BLOOD SUGAR TWICE DAILY AS DIRECTED 100 each 2  . LORazepam (ATIVAN) 0.5 MG tablet TAKE 1 TABLET BY MOUTH ONCE DAILY AT BEDTIME AS NEEDED FOR SLEEP 30 tablet 1  . metFORMIN (GLUCOPHAGE) 500 MG tablet TAKE 1 TABLET BY MOUTH TWICE DAILY WITH MEALS 60 tablet 0  . metoprolol tartrate (LOPRESSOR) 25 MG tablet TAKE 1 TABLET BY MOUTH TWICE DAILY 60 tablet 3  . mometasone-formoterol (DULERA) 200-5 MCG/ACT AERO Inhale 2 puffs into the lungs 2 (two) times daily. 13 g 5  . OXYGEN Inhale 3 L into the lungs continuous.     . potassium chloride SA (KLOR-CON) 20 MEQ tablet TAKE 1 TABLET BY MOUTH DAILY WITH LASIX 30 tablet 2  . pravastatin (PRAVACHOL) 80 MG tablet TAKE 1 TABLET BY MOUTH AT BEDTIME 30 tablet 3  . predniSONE (DELTASONE) 20 MG tablet Take 2 tablets (40 mg total) by mouth daily. 10 tablet 0  . diltiazem (CARDIZEM CD) 240 MG 24 hr capsule TAKE 1 CAPSULE BY MOUTH DAILY (Patient not taking: Reported on 12/29/2019) 90 capsule 4   No current facility-administered medications on file prior to visit.   Allergies  Allergen Reactions  . Ace Inhibitors Other (See Comments)    Hyperkalemia--07/23/2013:patient states not familiar with the following allergy   Social History   Socioeconomic History  . Marital status: Married    Spouse name: Not on file  . Number of children: Not on file  . Years of education: Not on file  . Highest education level: Not on file  Occupational History  . 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 Smoking status: Former Smoker    Packs/day: 1.50    Years: 60.00    Pack years: 90.00    Types: Cigarettes    Quit date: 12/31/2012    Years since quitting: 6.9  . Smokeless tobacco: Never Used  Substance and Sexual Activity  . Alcohol use: No  . Drug use: No  . Sexual activity: Yes    Birth control/protection: None  Other  Topics Concern  . Not on file  Social History Narrative  . Not on file   Social Determinants of Health   Financial Resource Strain:   . Difficulty of Paying Living Expenses:   Food Insecurity:   . Worried About Charity fundraiser in the Last Year:   . Arboriculturist in the Last Year:   Transportation Needs: No Transportation Needs  . Lack of Transportation (Medical): No  . Lack of Transportation (Non-Medical): No  Physical Activity: Inactive  . Days of Exercise per Week: 0  days  . Minutes of Exercise per Session: 0 min  Stress: No Stress Concern Present  . Feeling of Stress : Only a little  Social Connections:   . Frequency of Communication with Friends and Family:   . Frequency of Social Gatherings with Friends and Family:   . Attends Religious Services:   . Active Member of Clubs or Organizations:   . Attends Archivist Meetings:   Marland Kitchen Marital Status:   Intimate Partner Violence:   . Fear of Current or Ex-Partner:   . Emotionally Abused:   Marland Kitchen Physically Abused:   . Sexually Abused:      Review of Systems  All other systems reviewed and are negative.      Objective:   Physical Exam Vitals reviewed.  Constitutional:      General: He is not in acute distress.    Appearance: Normal appearance. He is obese. He is not ill-appearing, toxic-appearing or diaphoretic.  HENT:     Nose: Nose normal. No congestion or rhinorrhea.  Cardiovascular:     Rate and Rhythm: Normal rate and regular rhythm.     Heart sounds: Normal heart sounds. No murmur. No gallop.   Pulmonary:     Effort: Pulmonary effort is normal. Prolonged expiration present. No tachypnea, accessory muscle usage, respiratory distress or retractions.     Breath sounds: Decreased air movement present. No stridor. Examination of the right-upper field reveals decreased breath sounds, wheezing and rhonchi. Examination of the left-upper field reveals decreased breath sounds, wheezing and rhonchi. Examination  of the right-lower field reveals decreased breath sounds, wheezing and rhonchi. Examination of the left-lower field reveals decreased breath sounds, wheezing and rhonchi. Decreased breath sounds, wheezing and rhonchi present. No rales.  Musculoskeletal:     Right lower leg: No edema.     Left lower leg: No edema.  Neurological:     Mental Status: He is alert.           Assessment & Plan:  COPD with acute exacerbation ([place])  Patient was given 80 mg of Depo-Medrol IM x1 now.  I have also increased his prednisone to 60 mg a day and he will take his next dose in the morning.  He will use albuterol 3 mg with Atrovent 0.5 mg nebs every 6 hours.  I gave him enough samples to last until tomorrow and I will recheck the patient.  Continue the Z-Pak.  Reassess in 24 hours or go to the emergency room immediately if worsening.  Continue the Stiolto.

## 2019-12-29 NOTE — Addendum Note (Signed)
Addended by: Shary Decamp B on: 12/29/2019 03:52 PM   Modules accepted: Orders

## 2019-12-30 ENCOUNTER — Ambulatory Visit (INDEPENDENT_AMBULATORY_CARE_PROVIDER_SITE_OTHER): Payer: Medicare Other | Admitting: Family Medicine

## 2019-12-30 ENCOUNTER — Encounter: Payer: Self-pay | Admitting: Family Medicine

## 2019-12-30 VITALS — BP 156/90 | HR 94 | Temp 97.6°F | Resp 16 | Ht 65.0 in | Wt 164.0 lb

## 2019-12-30 DIAGNOSIS — J441 Chronic obstructive pulmonary disease with (acute) exacerbation: Secondary | ICD-10-CM

## 2019-12-30 MED ORDER — STIOLTO RESPIMAT 2.5-2.5 MCG/ACT IN AERS: 2.0000 | INHALATION_SPRAY | Freq: Every day | RESPIRATORY_TRACT | 5 refills | Status: DC

## 2019-12-30 MED ORDER — IPRATROPIUM-ALBUTEROL 0.5-2.5 (3) MG/3ML IN SOLN: 3.0000 mL | Freq: Four times a day (QID) | RESPIRATORY_TRACT | 1 refills | Status: DC | PRN

## 2019-12-30 MED ORDER — PREDNISONE 20 MG PO TABS: 60.0000 mg | ORAL_TABLET | Freq: Every day | ORAL | 0 refills | Status: DC

## 2019-12-30 NOTE — Progress Notes (Signed)
Subjective:    Patient ID: Edward [name], male    DOB: 03-09-1935, 85 y.o.   MRN: [id number]  HPI  12/29/19 Was seen in ER 3/27.  CXR showed: Mild-to-moderate patchy bilateral interstitial and airspace opacities may represent pulmonary edema and/or atypical pneumonia such as COVID-19. COVID and Influenza tests were negative.  BNP was 68.   Patient was started on a Z-Pak and was given 40 mg of prednisone daily.  He is here today for follow-up.  He is 90% on 3 L.  He has diffuse expiratory wheezing.  Patient has very poor air movement in all 4 lung fields.  He has rhonchorous breath sounds.  He reports feeling tight in his lungs and short of breath.  He continues to cough.  He does not feel much better from when he went to the hospital.  He denies any hemoptysis.  He denies any chest pain.  He denies any pleurisy.  He denies any fever.  Patient was given a DuoNeb upon arrival.  Pulse oximetry improved to 92% on 3 L and breathing improved.  When I questioned the patient as to what he is doing at home, he has not been taking his Dulera.  Instead he has been using "the twister".  I am assuming this is a sample of Stiolto that he was able to get from one of the pharmacy assistance programs.  He also reports using his albuterol inhaler every 3-4 hours in addition to albuterol nebulizer treatments every 3-4 hours.  He denies any palpitations or tachycardia.  He denies any purulent sputum.  At that time, my plan was: Patient was given 80 mg of Depo-Medrol IM x1 now.  I have also increased his prednisone to 60 mg a day and he will take his next dose in the morning.  He will use albuterol 3 mg with Atrovent 0.5 mg nebs every 6 hours.  I gave him enough samples to last until tomorrow and I will recheck the patient.  Continue the Z-Pak.  Reassess in 24 hours or go to the emergency room immediately if worsening.  Continue the Stiolto.  12/30/19 Patient states that his breathing has improved considerably since  yesterday.  On pulmonary exam, his air movement has improved dramatically.  His breath sounds have improved.  He now has expiratory wheezing but he is moving a much larger lung volume subjectively.  There are no further rhonchorous breath sounds although he is still having expiratory wheezing.  His tachypnea has improved and his increased work of breathing has improved.  His pulse oximetry is 87% on room air however he forgot to bring his oxygen and he is on 3 L chronically.   Past Medical History:  Diagnosis Date  . Allergy    Rhinitis  . Atrial fibrillation ([place])   . Bronchitis   . Chronic respiratory failure ([place])   . Colon polyps   . COPD (chronic obstructive pulmonary disease) ([place])   . Diabetes mellitus   . Elevated lipids   . Hypercholesterolemia   . Hypertension   . Iron deficiency anemia due to chronic blood loss 04/30/2018  . Noncompliance   . On home O2    2L N/C   . PSA elevation   . Pulmonary fibrosis ([place])   . Vitamin D deficiency    Past Surgical History:  Procedure Laterality Date  . BIOPSY  04/23/2018   Procedure: BIOPSY;  Surgeon: Danie Binder, MD;  [facility];  Service: Endoscopy;;  duodenum gastric  . CATARACT EXTRACTION W/PHACO  06/25/2012   Procedure: CATARACT EXTRACTION PHACO AND INTRAOCULAR LENS PLACEMENT (IOC);  Surgeon: Elta Guadeloupe T. Gershon Crane, MD;  Location: [facility];  Service: Ophthalmology;  Laterality: Left;  CDE=19.01  . CATARACT EXTRACTION W/PHACO  07/09/2012   Procedure: CATARACT EXTRACTION PHACO AND INTRAOCULAR LENS PLACEMENT (IOC);  Surgeon: Elta Guadeloupe T. Gershon Crane, MD;  Location: [facility];  Service: Ophthalmology;  Laterality: Right;  CDE: 20.09  . COLONOSCOPY WITH PROPOFOL N/A 04/23/2018   Procedure: COLONOSCOPY WITH PROPOFOL;  Surgeon: Danie Binder, MD;  [facility];  Service: Endoscopy;  Laterality: N/A;  . ESOPHAGOGASTRODUODENOSCOPY (EGD) WITH PROPOFOL N/A 04/23/2018   Procedure: ESOPHAGOGASTRODUODENOSCOPY (EGD) WITH PROPOFOL;  Surgeon:  Danie Binder, MD;  [facility];  Service: Endoscopy;  Laterality: N/A;   Current Outpatient Medications on File Prior to Visit  Medication Sig Dispense Refill  . albuterol (PROVENTIL) (2.5 MG/3ML) 0.083% nebulizer solution INHALE 1 VIAL VIA NEBULIZER EVERY 6 HOURS AS NEEDED FOR WHEEZING OR SHORTNESS OF BREATH 360 mL 4  . azithromycin (ZITHROMAX) 250 MG tablet Take 1 tablet (250 mg total) by mouth daily. Take first 2 tablets together, then 1 every day until finished. 6 tablet 0  . cloNIDine (CATAPRES) 0.1 MG tablet TAKE 1 TABLET BY MOUTH 2 TIMES A DAY 60 tablet 3  . ELIQUIS 5 MG TABS tablet Take 1 tablet by mouth twice daily 60 tablet 3  . furosemide (LASIX) 40 MG tablet TAKE 1 TABLET BY MOUTH DAILY 30 tablet 3  . Insulin Glargine (BASAGLAR KWIKPEN) 100 UNIT/ML SOPN Inject 0.3 mLs (30 Units total) into the skin at bedtime. 10 pen 3  . Insulin Pen Needle (PEN NEEDLES) 31G X 6 MM MISC Use with insulin qd 100 each 2  . ipratropium (ATROVENT) 0.02 % nebulizer solution INHALE THE CONTENTS OF 1 VIAL VIA NEBULIZATION 4 TIMES DAILY 75 mL 11  . Lancets (ONETOUCH DELICA PLUS 123XX123) MISC USE TO CHECK BLOOD SUGAR TWICE DAILY AS DIRECTED 100 each 2  . LORazepam (ATIVAN) 0.5 MG tablet TAKE 1 TABLET BY MOUTH ONCE DAILY AT BEDTIME AS NEEDED FOR SLEEP 30 tablet 1  . metFORMIN (GLUCOPHAGE) 500 MG tablet TAKE 1 TABLET BY MOUTH TWICE DAILY WITH MEALS 60 tablet 0  . metoprolol tartrate (LOPRESSOR) 25 MG tablet TAKE 1 TABLET BY MOUTH TWICE DAILY 60 tablet 3  . mometasone-formoterol (DULERA) 200-5 MCG/ACT AERO Inhale 2 puffs into the lungs 2 (two) times daily. 13 g 5  . OXYGEN Inhale 3 L into the lungs continuous.     . potassium chloride SA (KLOR-CON) 20 MEQ tablet TAKE 1 TABLET BY MOUTH DAILY WITH LASIX 30 tablet 2  . pravastatin (PRAVACHOL) 80 MG tablet TAKE 1 TABLET BY MOUTH AT BEDTIME 30 tablet 3  . diltiazem (CARDIZEM CD) 240 MG 24 hr capsule TAKE 1 CAPSULE BY MOUTH DAILY (Patient not taking:  Reported on 12/29/2019) 90 capsule 4   No current facility-administered medications on file prior to visit.   Allergies  Allergen Reactions  . Ace Inhibitors Other (See Comments)    Hyperkalemia--07/23/2013:patient states not familiar with the following allergy   Social History   Socioeconomic History  . Marital status: Married    Spouse name: Not on file  . Number of children: Not on file  . Years of education: Not on file  . Highest education level: Not on file  Occupational History  . 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  Smoking status: Former Smoker    Packs/day: 1.50    Years: 60.00    Pack years: 90.00    Types: Cigarettes    Quit date: 12/31/2012    Years since quitting: 7.0  . Smokeless tobacco: Never Used  Substance and Sexual Activity  . Alcohol use: No  . Drug use: No  . Sexual activity: Yes    Birth control/protection: None  Other Topics Concern  . Not on file  Social History Narrative  . Not on file   Social Determinants of Health   Financial Resource Strain:   . Difficulty of Paying Living Expenses:   Food Insecurity:   . Worried About Charity fundraiser in the Last Year:   . Arboriculturist in the Last Year:   Transportation Needs: No Transportation Needs  . Lack of Transportation (Medical): No  . Lack of Transportation (Non-Medical): No  Physical Activity: Inactive  . Days of Exercise per Week: 0 days  . Minutes of Exercise per Session: 0 min  Stress: No Stress Concern Present  . Feeling of Stress : Only a little  Social Connections:   . Frequency of Communication with Friends and Family:   . Frequency of Social Gatherings with Friends and Family:   . Attends Religious Services:   . Active Member of Clubs or Organizations:   . Attends Archivist Meetings:   Marland Kitchen Marital Status:   Intimate Partner Violence:   . Fear of Current or Ex-Partner:   . Emotionally Abused:   Marland Kitchen Physically Abused:   . Sexually  Abused:      Review of Systems  All other systems reviewed and are negative.      Objective:   Physical Exam Vitals reviewed.  Constitutional:      General: He is not in acute distress.    Appearance: Normal appearance. He is obese. He is not ill-appearing, toxic-appearing or diaphoretic.  HENT:     Nose: Nose normal. No congestion or rhinorrhea.  Cardiovascular:     Rate and Rhythm: Normal rate and regular rhythm.     Heart sounds: Normal heart sounds. No murmur. No gallop.   Pulmonary:     Effort: Pulmonary effort is normal. Prolonged expiration present. No tachypnea, accessory muscle usage, respiratory distress or retractions.     Breath sounds: Decreased air movement present. No stridor. Examination of the right-upper field reveals decreased breath sounds and wheezing. Examination of the left-upper field reveals decreased breath sounds and wheezing. Examination of the right-lower field reveals decreased breath sounds and wheezing. Examination of the left-lower field reveals decreased breath sounds and wheezing. Decreased breath sounds and wheezing present. No rhonchi or rales.  Musculoskeletal:     Right lower leg: No edema.     Left lower leg: No edema.  Neurological:     Mental Status: He is alert.           Assessment & Plan:  COPD with acute exacerbation ([place])  Breathing has improved dramatically since yesterday.  Continue 60 mg of prednisone for an additional 5 days.  Continue duonebs (3 mg / 0.5 mg) every 6 hours as needed for wheezing.  Discontinue albuterol alone nebs that he was overusing at home.  Continue Stiolto.  As his breathing continues to improve, wean back on the duo nebs.  Reassess in 48 hours if no better or immediately if worse.

## 2019-12-31 ENCOUNTER — Telehealth: Payer: Self-pay | Admitting: Pharmacist

## 2019-12-31 ENCOUNTER — Other Ambulatory Visit: Payer: Self-pay | Admitting: Pharmacy Technician

## 2019-12-31 NOTE — Patient Outreach (Signed)
[facility]) Care Management  12/31/2019  Edward [name] [birth year] [id number]   Called patient to inquire about his LIS letter. [facility] Pharmacist, Ralene Bathe, documented the patient has partial LIS. Patient's son and caregiver, [name] answered the phone.   [name] said he "thinks" he has seen a letter around their house but was not at home at the time of my call to be sure.  [name] was also asked about his father's current inhaler regimen. Since he was not at home, he could not verify what his father was using. However, he did say he was unaware of any changes.  There is also not any fill history of Stiolto for the patient. He could have been given a sample.    Plan: Await a call back from [place]. Follow up with the patient about his regimen. Remind him about the [facility] that will be coming to his home. Update the physician on what the patient is actually using.  [name], PharmD, [place] Clinical Pharmacist [phone]

## 2019-12-31 NOTE — Patient Outreach (Signed)
[facility]) Care Management  12/31/2019  Edward [name] Aug 30, 1935 [id number]    Care coordination call placed to [employer] in regards to patient's application for [facility].  Spoke to [place] who informed patient was APPROVED 12/30/2019-10/01/2020. She informed the patient should receive his medication delivered to his home in the next few weeks.  Will follow up with patient in 10-14 business days to confirm he received the [facility] and to discuss refill procedure. Will also at that time inquire if he has received his other patient assistance inhaler Joycelyn Rua with [employer]) and inquire if his son found his LIS determination letter.[name], [place]  [phone]

## 2019-12-31 NOTE — Telephone Encounter (Signed)
-----   Message from Jason Fila, CPhT sent at 12/31/2019  9:52 AM EDT ----- Patient was approved temporarily for Spiriva. He needs to provide his LIS letter. He should be getting 3 inhalers of that if not already received.   He is also approved for [facility] and will be getting 3 inhalers of that in the next few weeks.  With that being said it looks like the provider has stopped both of these meds and placed him on Stiolto after his ED vist on 3/27.  Can you help me with this?  Thanks, JS

## 2020-01-01 ENCOUNTER — Other Ambulatory Visit: Payer: Self-pay | Admitting: Family Medicine

## 2020-01-01 DIAGNOSIS — J449 Chronic obstructive pulmonary disease, unspecified: Secondary | ICD-10-CM | POA: Diagnosis not present

## 2020-01-04 ENCOUNTER — Other Ambulatory Visit: Payer: Self-pay | Admitting: Family Medicine

## 2020-01-08 ENCOUNTER — Ambulatory Visit: Payer: Self-pay | Admitting: Pharmacist

## 2020-01-08 ENCOUNTER — Other Ambulatory Visit: Payer: Self-pay | Admitting: Pharmacist

## 2020-01-08 NOTE — Patient Outreach (Signed)
[place]) Care Management  01/08/2020  Edward [name] 13-May-1935 [id number]   Patient was called to follow up on medication assistance.  Unfortunately, he did not answer the phone and did not have a voicemail set up.    A message was left with Dr. Samella Parr nurse for clarification.  The prescription details have Siolto listed as "no print" so a new prescription was not sent to the patient's pharmacy.  This was verified with [facility] in [place] as they did not have Siolto on file either.   Plan: Await a call back from Dr. Samella Parr nurse. Follow up in 1 week.   [name], PharmD, [place] Clinical Pharmacist [phone]

## 2020-01-10 ENCOUNTER — Emergency Department (HOSPITAL_COMMUNITY)
Admission: EM | Admit: 2020-01-10 | Discharge: 2020-01-10 | Disposition: A | Discharge status: Home / Self Care | Payer: Medicare Other | Attending: Emergency Medicine | Admitting: Emergency Medicine

## 2020-01-10 ENCOUNTER — Other Ambulatory Visit: Payer: Self-pay

## 2020-01-10 ENCOUNTER — Emergency Department (HOSPITAL_COMMUNITY): Payer: Medicare Other

## 2020-01-10 ENCOUNTER — Encounter (HOSPITAL_COMMUNITY): Payer: Self-pay | Admitting: Emergency Medicine

## 2020-01-10 DIAGNOSIS — Z87891 Personal history of nicotine dependence: Secondary | ICD-10-CM | POA: Insufficient documentation

## 2020-01-10 DIAGNOSIS — I5030 Unspecified diastolic (congestive) heart failure: Secondary | ICD-10-CM | POA: Diagnosis not present

## 2020-01-10 DIAGNOSIS — I130 Hypertensive heart and chronic kidney disease with heart failure and stage 1 through stage 4 chronic kidney disease, or unspecified chronic kidney disease: Secondary | ICD-10-CM | POA: Diagnosis not present

## 2020-01-10 DIAGNOSIS — Z7901 Long term (current) use of anticoagulants: Secondary | ICD-10-CM | POA: Diagnosis not present

## 2020-01-10 DIAGNOSIS — Z794 Long term (current) use of insulin: Secondary | ICD-10-CM | POA: Insufficient documentation

## 2020-01-10 DIAGNOSIS — E1122 Type 2 diabetes mellitus with diabetic chronic kidney disease: Secondary | ICD-10-CM | POA: Insufficient documentation

## 2020-01-10 DIAGNOSIS — N183 Chronic kidney disease, stage 3 unspecified: Secondary | ICD-10-CM | POA: Diagnosis not present

## 2020-01-10 DIAGNOSIS — J449 Chronic obstructive pulmonary disease, unspecified: Secondary | ICD-10-CM | POA: Insufficient documentation

## 2020-01-10 DIAGNOSIS — Z9981 Dependence on supplemental oxygen: Secondary | ICD-10-CM | POA: Diagnosis not present

## 2020-01-10 DIAGNOSIS — M5412 Radiculopathy, cervical region: Secondary | ICD-10-CM | POA: Diagnosis not present

## 2020-01-10 DIAGNOSIS — M79601 Pain in right arm: Secondary | ICD-10-CM | POA: Diagnosis not present

## 2020-01-10 DIAGNOSIS — M542 Cervicalgia: Secondary | ICD-10-CM | POA: Diagnosis not present

## 2020-01-10 DIAGNOSIS — M25511 Pain in right shoulder: Secondary | ICD-10-CM | POA: Diagnosis not present

## 2020-01-10 MED ORDER — HYDROCODONE-ACETAMINOPHEN 5-325 MG PO TABS: 1.0000 | ORAL_TABLET | Freq: Four times a day (QID) | ORAL | 0 refills | Status: DC | PRN

## 2020-01-10 NOTE — ED Provider Notes (Signed)
[facility] EMERGENCY DEPARTMENT Provider Note   CSN: [id number] Arrival date & time: 01/10/20  [id number]     History Chief Complaint  Patient presents with  . Arm Pain    Edward [name] is a 85 y.o. male.  Patient brought in by family members.  Patient has COPD.  Is normally on 3 L of oxygen at all times.  Not here for breathing problem today but complaint of right arm pain radiating down to his fingers for about a week.  Patient seems to think it involves all the fingers.  Seems to come from the shoulder area but also has some neck pain as well.  Denies any weakness of the hand or any numbness of the hand currently.  No fall or injury.  Patient at the end of March was on a steroid course by his primary care doctor.  [facility]        Past Medical History:  Diagnosis Date  . Allergy    Rhinitis  . Atrial fibrillation ([place])   . Bronchitis   . Chronic respiratory failure ([place])   . Colon polyps   . COPD (chronic obstructive pulmonary disease) ([place])   . Diabetes mellitus   . Elevated lipids   . Hypercholesterolemia   . Hypertension   . Iron deficiency anemia due to chronic blood loss 04/30/2018  . Noncompliance   . On home O2    2L N/C   . PSA elevation   . Pulmonary fibrosis ([place])   . Vitamin D deficiency     Patient Active Problem List   Diagnosis Date Noted  . Mixed hyperlipidemia 03/06/2019  . Parotitis 11/01/2018  . Syncope 10/30/2018  . Facial cellulitis 10/30/2018  . Chronic respiratory failure with hypoxia ([place]) 10/30/2018  . Chronic diastolic heart failure ([place])   . Hypoxia   . Atrial fibrillation ([place])   . COPD with acute exacerbation ([place]) 07/31/2018  . Hyponatremia 07/31/2018  . COPD (chronic obstructive pulmonary disease) ([place]) 05/21/2018  . Stage 3 chronic kidney disease 05/21/2018  . Diastolic congestive heart failure ([place]) 05/21/2018  . Type 2 diabetes mellitus ([place]) 05/21/2018  . HTN (hypertension) 05/21/2018   . Iron deficiency anemia due to chronic blood loss 04/30/2018  . Respiratory distress   . Hematochezia   . Goals of care, counseling/discussion   . Palliative care by specialist   . DNR (do not resuscitate) discussion   . Acute renal failure with acute tubular necrosis superimposed on stage 3 chronic kidney disease ([employer])   . Palliative care encounter   . COPD exacerbation ([place]) 04/17/2018  . Acute on chronic diastolic CHF (congestive heart failure) ([place])   . Severe anemia 02/17/2018  . Bradycardia 02/17/2018  . Hypothermia   . Gastroesophageal reflux disease   . Acute encephalopathy 09/25/2017  . On home O2 09/25/2017  . Bilateral lower extremity edema 06/27/2017  . Insomnia 03/28/2017  . CKD (chronic kidney disease), stage III 03/07/2017  . AF (paroxysmal atrial fibrillation) ([place]) 03/05/2017  . Chronic diastolic CHF (congestive heart failure) ([place]) 03/04/2017  . HCAP (healthcare-associated pneumonia) 03/04/2017  . Sepsis due to pneumonia ([place]) 02/25/2017  . Constipation 02/25/2017  . Overflow diarrhea/Constipation 02/25/2017  . DM type 2 causing vascular disease ([place]) 10/11/2016  . Non compliance w medication regimen 12/02/2015  . Hypercholesterolemia 05/12/2014  . Acute on chronic respiratory failure with hypoxemia ([place]) 11/17/2013  . Elevated PSA 01/20/2013  . Hypertension   . Elevated lipids   .  Pulmonary fibrosis ([place])   . Colon polyps   . Colon polyps     Past Surgical History:  Procedure Laterality Date  . BIOPSY  04/23/2018   Procedure: BIOPSY;  Surgeon: Danie Binder, MD;  Location: [facility] ENDO SUITE;  Service: Endoscopy;;  duodenum gastric  . CATARACT EXTRACTION W/PHACO  06/25/2012   Procedure: CATARACT EXTRACTION PHACO AND INTRAOCULAR LENS PLACEMENT (IOC);  Surgeon: Elta Guadeloupe T. Gershon Crane, MD;  Location: [facility] ORS;  Service: Ophthalmology;  Laterality: Left;  CDE=19.01  . CATARACT EXTRACTION W/PHACO  07/09/2012   Procedure: CATARACT EXTRACTION PHACO AND INTRAOCULAR LENS  PLACEMENT (IOC);  Surgeon: Elta Guadeloupe T. Gershon Crane, MD;  Location: [facility] ORS;  Service: Ophthalmology;  Laterality: Right;  CDE: 20.09  . COLONOSCOPY WITH PROPOFOL N/A 04/23/2018   Procedure: COLONOSCOPY WITH PROPOFOL;  Surgeon: Danie Binder, MD;  Location: [facility] ENDO SUITE;  Service: Endoscopy;  Laterality: N/A;  . ESOPHAGOGASTRODUODENOSCOPY (EGD) WITH PROPOFOL N/A 04/23/2018   Procedure: ESOPHAGOGASTRODUODENOSCOPY (EGD) WITH PROPOFOL;  Surgeon: Danie Binder, MD;  Location: [facility] ENDO SUITE;  Service: Endoscopy;  Laterality: N/A;       Family History  Problem Relation Age of Onset  . Heart disease Mother   . CAD Other   . Diabetes Other     Social History   Tobacco Use  . Smoking status: Former Smoker    Packs/day: 1.50    Years: 60.00    Pack years: 90.00    Types: Cigarettes    Quit date: 12/31/2012    Years since quitting: 7.0  . Smokeless tobacco: Never Used  Substance Use Topics  . Alcohol use: No  . Drug use: No    Home Medications Prior to Admission medications   Medication Sig Start Date End Date Taking? Authorizing Provider  albuterol (PROVENTIL) (2.5 MG/3ML) 0.083% nebulizer solution INHALE 1 VIAL VIA NEBULIZER EVERY 6 HOURS AS NEEDED FOR WHEEZING OR SHORTNESS OF BREATH 08/12/19   Susy Frizzle, MD  azithromycin (ZITHROMAX) 250 MG tablet Take 1 tablet (250 mg total) by mouth daily. Take first 2 tablets together, then 1 every day until finished. 12/27/19   Virgel Manifold, MD  cloNIDine (CATAPRES) 0.1 MG tablet TAKE 1 TABLET BY MOUTH 2 TIMES A DAY 07/11/19   Susy Frizzle, MD  diltiazem (CARDIZEM CD) 240 MG 24 hr capsule TAKE 1 CAPSULE BY MOUTH DAILY Patient not taking: Reported on 12/29/2019 10/30/18   Susy Frizzle, MD  ELIQUIS 5 MG TABS tablet Take 1 tablet by mouth twice daily 10/24/19   Susy Frizzle, MD  furosemide (LASIX) 40 MG tablet TAKE 1 TABLET BY MOUTH DAILY 12/22/19   Susy Frizzle, MD  HYDROcodone-acetaminophen (NORCO/VICODIN) 5-325 MG tablet Take 1  tablet by mouth every 6 (six) hours as needed. 01/10/20   Fredia Sorrow, MD  Insulin Glargine (BASAGLAR KWIKPEN) 100 UNIT/ML SOPN Inject 0.3 mLs (30 Units total) into the skin at bedtime. 07/21/19   Susy Frizzle, MD  Insulin Pen Needle (PEN NEEDLES) 31G X 6 MM MISC Use with insulin qd 10/13/19   Susy Frizzle, MD  ipratropium-albuterol (DUONEB) 0.5-2.5 (3) MG/3ML SOLN Take 3 mLs by nebulization every 6 (six) hours as needed. 12/30/19   Susy Frizzle, MD  Lancets (ONETOUCH DELICA PLUS 123XX123) MISC USE TO CHECK BLOOD SUGAR TWICE DAILY AS DIRECTED 01/30/19   Susy Frizzle, MD  LORazepam (ATIVAN) 0.5 MG tablet TAKE 1 TABLET BY MOUTH ONCE DAILY AT BEDTIME AS NEEDED FOR SLEEP 10/27/19   [name],  [name], MD  metFORMIN (GLUCOPHAGE) 500 MG tablet TAKE 1 TABLET BY MOUTH TWICE DAILY WITH MEALS 12/16/19   [name], [name], MD  metoprolol tartrate (LOPRESSOR) 25 MG tablet TAKE 1 TABLET BY MOUTH TWICE DAILY 07/11/19   Susy Frizzle, MD  OXYGEN Inhale 3 L into the lungs continuous.     [provider]  potassium chloride SA (KLOR-CON) 20 MEQ tablet TAKE 1 TABLET BY MOUTH DAILY WITH LASIX 07/11/19   Susy Frizzle, MD  pravastatin (PRAVACHOL) 80 MG tablet TAKE 1 TABLET BY MOUTH AT BEDTIME 11/17/19   Susy Frizzle, MD  predniSONE (DELTASONE) 20 MG tablet Take 3 tablets (60 mg total) by mouth daily with breakfast. 12/30/19   Susy Frizzle, MD  Tiotropium Bromide-Olodaterol (STIOLTO RESPIMAT) 2.5-2.5 MCG/ACT AERS Inhale 2 puffs into the lungs daily. 12/30/19   Susy Frizzle, MD    Allergies    Ace inhibitors  Review of Systems   Review of Systems  Constitutional: Negative for chills and fever.  HENT: Negative for congestion, rhinorrhea and sore throat.   Eyes: Negative for visual disturbance.  Respiratory: Negative for cough and shortness of breath.   Cardiovascular: Negative for chest pain and leg swelling.  Gastrointestinal: Negative for abdominal pain,  diarrhea, nausea and vomiting.  Genitourinary: Negative for dysuria.  Musculoskeletal: Positive for neck pain. Negative for back pain.  Skin: Negative for rash.  Neurological: Negative for dizziness, light-headedness and headaches.  Hematological: Does not bruise/bleed easily.  Psychiatric/Behavioral: Negative for confusion.    Physical Exam Updated Vital Signs BP (!) 163/106   Pulse 81   Temp 98.2 F (36.8 C) (Oral)   Resp 16   Ht 1.651 m (5\' 5" )   Wt 74.4 kg   SpO2 100%   BMI 27.29 kg/m   Physical Exam Vitals and nursing note reviewed.  Constitutional:      Appearance: Normal appearance. He is well-developed.  HENT:     Head: Normocephalic and atraumatic.  Eyes:     Extraocular Movements: Extraocular movements intact.     Conjunctiva/sclera: Conjunctivae normal.     Pupils: Pupils are equal, round, and reactive to light.  Cardiovascular:     Rate and Rhythm: Normal rate and regular rhythm.     Heart sounds: No murmur.  Pulmonary:     Effort: Pulmonary effort is normal. No respiratory distress.     Breath sounds: Normal breath sounds.  Abdominal:     Palpations: Abdomen is soft.     Tenderness: There is no abdominal tenderness.  Musculoskeletal:        General: No swelling. Normal range of motion.     Cervical back: Neck supple.  Skin:    General: Skin is warm and dry.     Capillary Refill: Capillary refill takes less than 2 seconds.  Neurological:     General: No focal deficit present.     Mental Status: He is alert and oriented to person, place, and time.     Cranial Nerves: No cranial nerve deficit.     Sensory: No sensory deficit.     Motor: No weakness.     ED Results / Procedures / Treatments   Labs (all labs ordered are listed, but only abnormal results are displayed) Labs Reviewed - No data to display  EKG None  Radiology DG Shoulder Right  Result Date: 01/10/2020 CLINICAL DATA:  Right arm pain radiating into fingers 1 week. No injury. EXAM:  RIGHT SHOULDER - 2+ VIEW  COMPARISON:  Chest x-ray 12/27/2019 FINDINGS: Mild degenerate change of the [facility] joint. No evidence of acute fracture or dislocation. IMPRESSION: No acute findings. Electronically Signed   By: Marin Olp M.D.   On: 01/10/2020 11:22   CT Cervical Spine Wo Contrast  Result Date: 01/10/2020 CLINICAL DATA:  Chronic neck pain and right arm pain radiating to fingers 1 week. No injury. EXAM: CT CERVICAL SPINE WITHOUT CONTRAST TECHNIQUE: Multidetector CT imaging of the cervical spine was performed without intravenous contrast. Multiplanar CT image reconstructions were also generated. COMPARISON:  None. FINDINGS: Alignment: Subtle 1-2 mm anterior subluxation of C7 on T1 likely degenerative due to facet arthropathy. Skull base and vertebrae: There is fusion of the C2 and C3 vertebral bodies. Mild spondylosis throughout the cervical spine. Reversal of the normal cervical lordosis. There is uncovertebral joint spurring and facet arthropathy. Atlantoaxial articulation is unremarkable. Mild right-sided neural foraminal narrowing at the C3-4 level. Mild left-sided neural from narrowing at the C4-5 level and C5-6 levels. Soft tissues and spinal canal: No prevertebral fluid or swelling. No visible canal hematoma. Disc levels:  Mild disc space narrowing at the C5-6 and C6-7 levels. Upper chest: Emphysematous disease over the lung apices. Other: None. IMPRESSION: 1.  No acute findings. 2. Mild spondylosis throughout the cervical spine to include facet arthropathy and uncovertebral joint spurring. Disc disease at the C5-6 and C6-7 levels. Neural foraminal narrowing at several levels as described due to adjacent bony spurring. 3.  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 4.  Emphysema (ICD10-J43.9). Electronically Signed   By: Marin Olp M.D.   On: 01/10/2020 11:20    Procedures Procedures (including critical care time)  Medications Ordered in ED Medications - No data to display  ED  Course  I have reviewed the triage vital signs and the nursing notes.  Pertinent labs & imaging results that were available during my care of the patient were reviewed by me and considered in my medical decision making (see chart for details).    MDM Rules/Calculators/A&P                       Clinically suspicious that this could be a soft cervical radiculopathy.  The patient kept pointing to his shoulder as the main part of pain.  X-ray of shoulder was done.  That was negative for any acute findings.  CT of the neck shows multilevel degenerative changes as well as bony spurring and some disc abnormalities.  Certainly could explain patient's pain.  Patient not clear whether he still taking the steroids.  So we will treat with just hydrocodone him follow back up with primary care provider.  May need referral for MRI or perhaps to Ortho for steroid injections.   Final Clinical Impression(s) / ED Diagnoses Final diagnoses:  Cervical radiculopathy    Rx / DC Orders ED Discharge Orders         Ordered    HYDROcodone-acetaminophen (NORCO/VICODIN) 5-325 MG tablet  Every 6 hours PRN     01/10/20 1217           Fredia Sorrow, MD 01/10/20 1228

## 2020-01-10 NOTE — Discharge Instructions (Signed)
X-ray of the right shoulder without any significant maladies.  CT scan of the bones in the neck.  Show a lot of degenerative changes a lot of spurring.  A lot of disc protrusions and narrowing.  Most likely patient's right arm pain is secondary to pinched nerves in the neck.  Call cervical radiculopathy.  Recommend following up with primary care provider.  May need an MRI in the future.  Could benefit from some steroid injections by a specialist if needed.  In the meantime take the hydrocodone as needed for the pain.

## 2020-01-10 NOTE — ED Triage Notes (Signed)
PT c/o right arm pain radiating down to his fingers x1 week and denies any injury. PT chronically on 3L n/c oxygen and states his son drove him to the ED today.

## 2020-01-13 ENCOUNTER — Other Ambulatory Visit: Payer: Self-pay | Admitting: "Endocrinology

## 2020-01-13 ENCOUNTER — Ambulatory Visit (INDEPENDENT_AMBULATORY_CARE_PROVIDER_SITE_OTHER): Payer: Medicare Other | Admitting: Family Medicine

## 2020-01-13 ENCOUNTER — Other Ambulatory Visit: Payer: Self-pay

## 2020-01-13 ENCOUNTER — Encounter: Payer: Self-pay | Admitting: Family Medicine

## 2020-01-13 ENCOUNTER — Other Ambulatory Visit: Payer: Self-pay | Admitting: Pharmacist

## 2020-01-13 VITALS — BP 130/60 | HR 88 | Temp 97.4°F | Resp 16 | Ht 65.0 in | Wt 165.0 lb

## 2020-01-13 DIAGNOSIS — B028 Zoster with other complications: Secondary | ICD-10-CM | POA: Diagnosis not present

## 2020-01-13 MED ORDER — VALACYCLOVIR HCL 1 G PO TABS: 1000.0000 mg | ORAL_TABLET | Freq: Three times a day (TID) | ORAL | 0 refills | Status: DC

## 2020-01-13 MED ORDER — HYDROCODONE-ACETAMINOPHEN 5-325 MG PO TABS: 1.0000 | ORAL_TABLET | Freq: Four times a day (QID) | ORAL | 0 refills | Status: DC | PRN

## 2020-01-13 MED ORDER — BUDESONIDE-FORMOTEROL FUMARATE 160-4.5 MCG/ACT IN AERO: 2.0000 | INHALATION_SPRAY | Freq: Two times a day (BID) | RESPIRATORY_TRACT | 3 refills | Status: DC

## 2020-01-13 NOTE — Progress Notes (Signed)
Subjective:    Patient ID: Edward [name], male    DOB: 05-Aug-1935, 85 y.o.   MRN: [id number]  HPI  Patient was seen in the emergency room on April 10 for severe pain in his right arm.  At that time there was no rash.  Presumptive diagnosis was cervical radiculopathy.  CT scanning was obtained of the cervical spine for further evaluation.  Patient had mild foraminal narrowing on the right and moderate foraminal narrowing on the left but no obvious nerve impingement.  Started on hydrocodone.  Over the last day he has developed a rash in his right upper extremity.  Please see the photograph below.  Patient reports burning stinging fire-like pain radiating up and down his right arm from his neck to his fingertips.  The pain is severe.  He is about out of hydrocodone that was given to him in the emergency room.  Rash is characterized by numerous erythematous papules coalescing into plaques on the length of the arm from his shoulder to his hand.  There are numerous erythematous nodules.  There are no visible vesicles.  Past Medical History:  Diagnosis Date  . Allergy    Rhinitis  . Atrial fibrillation ([place])   . Bronchitis   . Chronic respiratory failure ([place])   . Colon polyps   . COPD (chronic obstructive pulmonary disease) ([place])   . Diabetes mellitus   . Elevated lipids   . Hypercholesterolemia   . Hypertension   . Iron deficiency anemia due to chronic blood loss 04/30/2018  . Noncompliance   . On home O2    2L N/C   . PSA elevation   . Pulmonary fibrosis ([place])   . Vitamin D deficiency    Past Surgical History:  Procedure Laterality Date  . BIOPSY  04/23/2018   Procedure: BIOPSY;  Surgeon: Danie Binder, MD;  Location: [facility] END[facility];  Service: Endoscopy;;  duodenum gastric  . CATARACT EXTRACTION W/PHACO  06/25/2012   Procedure: CATARACT EXTRACTION PHACO AND INTRAOCULAR LENS PLACEMENT (IOC);  Surgeon: Elta Guadeloupe T. Gershon Crane, MD;  Location: [facility] ORS;  Service: Ophthalmology;  Laterality: Left;   CDE=19.01  . CATARACT EXTRACTION W/PHACO  07/09/2012   Procedure: CATARACT EXTRACTION PHACO AND INTRAOCULAR LENS PLACEMENT (IOC);  Surgeon: Elta Guadeloupe T. Gershon Crane, MD;  Location: [facility] ORS;  Service: Ophthalmology;  Laterality: Right;  CDE: 20.09  . COLONOSCOPY WITH PROPOFOL N/A 04/23/2018   Procedure: COLONOSCOPY WITH PROPOFOL;  Surgeon: Danie Binder, MD;  Location: [facility] ENDO SUITE;  Service: Endoscopy;  Laterality: N/A;  . ESOPHAGOGASTRODUODENOSCOPY (EGD) WITH PROPOFOL N/A 04/23/2018   Procedure: ESOPHAGOGASTRODUODENOSCOPY (EGD) WITH PROPOFOL;  Surgeon: Danie Binder, MD;  Location: [facility];  Service: Endoscopy;  Laterality: N/A;   Current Outpatient Medications on File Prior to Visit  Medication Sig Dispense Refill  . albuterol (PROVENTIL) (2.5 MG/3ML) 0.083% nebulizer solution INHALE 1 VIAL VIA NEBULIZER EVERY 6 HOURS AS NEEDED FOR WHEEZING OR SHORTNESS OF BREATH 360 mL 4  . azithromycin (ZITHROMAX) 250 MG tablet Take 1 tablet (250 mg total) by mouth daily. Take first 2 tablets together, then 1 every day until finished. 6 tablet 0  . cloNIDine (CATAPRES) 0.1 MG tablet TAKE 1 TABLET BY MOUTH 2 TIMES A DAY 60 tablet 3  . ELIQUIS 5 MG TABS tablet Take 1 tablet by mouth twice daily 60 tablet 3  . furosemide (LASIX) 40 MG tablet TAKE 1 TABLET BY MOUTH DAILY 30 tablet 3  . Insulin Glargine (BASAGLAR KWIKPEN) 100 UNIT/ML  SOPN Inject 0.3 mLs (30 Units total) into the skin at bedtime. 10 pen 3  . Insulin Pen Needle (PEN NEEDLES) 31G X 6 MM MISC Use with insulin qd 100 each 2  . ipratropium-albuterol (DUONEB) 0.5-2.5 (3) MG/3ML SOLN Take 3 mLs by nebulization every 6 (six) hours as needed. 360 mL 1  . Lancets (ONETOUCH DELICA PLUS 123XX123) MISC USE TO CHECK BLOOD SUGAR TWICE DAILY AS DIRECTED 100 each 2  . LORazepam (ATIVAN) 0.5 MG tablet TAKE 1 TABLET BY MOUTH ONCE DAILY AT BEDTIME AS NEEDED FOR SLEEP 30 tablet 1  . metFORMIN (GLUCOPHAGE) 500 MG tablet TAKE 1 TABLET BY MOUTH TWICE DAILY WITH MEALS 60  tablet 0  . metoprolol tartrate (LOPRESSOR) 25 MG tablet TAKE 1 TABLET BY MOUTH TWICE DAILY 60 tablet 3  . OXYGEN Inhale 3 L into the lungs continuous.     . potassium chloride SA (KLOR-CON) 20 MEQ tablet TAKE 1 TABLET BY MOUTH DAILY WITH LASIX 30 tablet 2  . pravastatin (PRAVACHOL) 80 MG tablet TAKE 1 TABLET BY MOUTH AT BEDTIME 30 tablet 3  . predniSONE (DELTASONE) 20 MG tablet Take 3 tablets (60 mg total) by mouth daily with breakfast. 15 tablet 0  . diltiazem (CARDIZEM CD) 240 MG 24 hr capsule TAKE 1 CAPSULE BY MOUTH DAILY (Patient not taking: Reported on 12/29/2019) 90 capsule 4   No current facility-administered medications on file prior to visit.   Allergies  Allergen Reactions  . Ace Inhibitors Other (See Comments)    Hyperkalemia--07/23/2013:patient states not familiar with the following allergy   Social History   Socioeconomic History  . Marital status: Married    Spouse name: Not on file  . Number of children: Not on file  . Years of education: Not on file  . Highest education level: Not on file  Occupational History  . 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 Smoking status: Former Smoker    Packs/day: 1.50    Years: 60.00    Pack years: 90.00    Types: Cigarettes    Quit date: 12/31/2012    Years since quitting: 7.0  . Smokeless tobacco: Never Used  Substance and Sexual Activity  . Alcohol use: No  . Drug use: No  . Sexual activity: Yes    Birth control/protection: None  Other Topics Concern  . Not on file  Social History Narrative  . Not on file   Social Determinants of Health   Financial Resource Strain:   . Difficulty of Paying Living Expenses:   Food Insecurity:   . Worried About Charity fundraiser in the Last Year:   . Arboriculturist in the Last Year:   Transportation Needs: No Transportation Needs  . Lack of Transportation (Medical): No  . Lack of Transportation (Non-Medical): No  Physical Activity: Inactive  . Days of  Exercise per Week: 0 days  . Minutes of Exercise per Session: 0 min  Stress: No Stress Concern Present  . Feeling of Stress : Only a little  Social Connections:   . Frequency of Communication with Friends and Family:   . Frequency of Social Gatherings with Friends and Family:   . Attends Religious Services:   . Active Member of Clubs or Organizations:   . Attends Archivist Meetings:   Marland Kitchen Marital Status:   Intimate Partner Violence:   . Fear of Current or Ex-Partner:   . Emotionally Abused:   Marland Kitchen Physically Abused:   .  Sexually Abused:      Review of Systems  All other systems reviewed and are negative.      Objective:   Physical Exam Vitals reviewed.  Constitutional:      General: He is not in acute distress.    Appearance: Normal appearance. He is obese. He is not ill-appearing, toxic-appearing or diaphoretic.  HENT:     Nose: Nose normal. No congestion or rhinorrhea.  Cardiovascular:     Rate and Rhythm: Normal rate and regular rhythm.     Heart sounds: Normal heart sounds. No murmur. No gallop.   Pulmonary:     Effort: Pulmonary effort is normal. Prolonged expiration present. No tachypnea, accessory muscle usage, respiratory distress or retractions.     Breath sounds: Decreased air movement present. No stridor. Examination of the right-upper field reveals decreased breath sounds and wheezing. Examination of the left-upper field reveals decreased breath sounds and wheezing. Examination of the right-lower field reveals decreased breath sounds and wheezing. Examination of the left-lower field reveals decreased breath sounds and wheezing. Decreased breath sounds and wheezing present. No rhonchi or rales.  Musculoskeletal:     Right lower leg: No edema.     Left lower leg: No edema.  Skin:    Findings: Rash present. Rash is macular and papular.       Neurological:     Mental Status: He is alert.           Assessment & Plan:  Herpes zoster with  complication  Begin Valtrex 1 g p.o. 3 times daily for 7 days.  Refill hydrocodone 5/325 1 p.o. every 6 hours number 40 tablets.  Reassess in 1 week.  If pain worsens, consider adding gabapentin.

## 2020-01-13 NOTE — Patient Outreach (Signed)
[facility]) Care Management  01/13/2020  Edward [name] [birth year] [id number]   Received a call back message from Crane at Dr. Samella Parr office in response the message I left with them last week to clarify the patient's inhaler therapy and to discuss options for patient assistance.    Also called patient's son and caregiver, [name], to have him physically read the name of the inhaler is father was/is using.  (There was some confusion about the patient getting Stiolto from a patient assistance program. However, the patient had been getting Dulera from [place] clarified today the patient is using Dulera vs Stiolto. The patient's medication list was updated.  [name] also said the patient has an appointment today with Dr. Dennard Schaumann.    Lovey Newcomer was sent an instant message today requesting Dr. Dennard Schaumann to consider changing the patient to Symbicort for [facility] because [employer] will no longer provide Dulera in their program.  She responded to my message positively and said they would take care of the change at the patient's appointment today.  [name] communicated understanding about the potential change of Dulera to another inhaler.  A letter will be sent to Sharee Pimple [place] to send to the patient for Symbicort Patient Assistance.  [name], PharmD, [place] Clinical Pharmacist [phone]

## 2020-01-14 ENCOUNTER — Other Ambulatory Visit: Payer: Self-pay | Admitting: Pharmacy Technician

## 2020-01-14 NOTE — Patient Outreach (Signed)
[facility]) Care Management  01/14/2020  Edward [name] Apr 11, 1935 [id number]                                       Medication Assistance Referral  Referral From: [place]  Medication/Company: Symbicort / [place] Patient application portion:  Mailed Provider application portion: Faxed  to Dr. Jenna Luo Provider address/fax verified via: Office website    Follow up:  Will follow up with patient in 10-15 business days to confirm application(s) have been received.  Iesha Summerhill P. Terrina Docter, [place]  [phone]

## 2020-01-15 ENCOUNTER — Ambulatory Visit: Payer: Self-pay | Admitting: Pharmacist

## 2020-01-19 ENCOUNTER — Emergency Department (HOSPITAL_COMMUNITY): Payer: Medicare Other

## 2020-01-19 ENCOUNTER — Emergency Department (HOSPITAL_COMMUNITY)
Admission: EM | Admit: 2020-01-19 | Discharge: 2020-01-19 | Disposition: A | Discharge status: Home / Self Care | Payer: Medicare Other | Attending: Emergency Medicine | Admitting: Emergency Medicine

## 2020-01-19 ENCOUNTER — Encounter (HOSPITAL_COMMUNITY): Payer: Self-pay | Admitting: Emergency Medicine

## 2020-01-19 ENCOUNTER — Other Ambulatory Visit: Payer: Self-pay

## 2020-01-19 DIAGNOSIS — Z794 Long term (current) use of insulin: Secondary | ICD-10-CM | POA: Insufficient documentation

## 2020-01-19 DIAGNOSIS — E1122 Type 2 diabetes mellitus with diabetic chronic kidney disease: Secondary | ICD-10-CM | POA: Insufficient documentation

## 2020-01-19 DIAGNOSIS — Z7901 Long term (current) use of anticoagulants: Secondary | ICD-10-CM | POA: Diagnosis not present

## 2020-01-19 DIAGNOSIS — Z87891 Personal history of nicotine dependence: Secondary | ICD-10-CM | POA: Insufficient documentation

## 2020-01-19 DIAGNOSIS — I480 Paroxysmal atrial fibrillation: Secondary | ICD-10-CM | POA: Insufficient documentation

## 2020-01-19 DIAGNOSIS — J449 Chronic obstructive pulmonary disease, unspecified: Secondary | ICD-10-CM | POA: Insufficient documentation

## 2020-01-19 DIAGNOSIS — I5032 Chronic diastolic (congestive) heart failure: Secondary | ICD-10-CM | POA: Diagnosis not present

## 2020-01-19 DIAGNOSIS — E11649 Type 2 diabetes mellitus with hypoglycemia without coma: Secondary | ICD-10-CM | POA: Insufficient documentation

## 2020-01-19 DIAGNOSIS — N183 Chronic kidney disease, stage 3 unspecified: Secondary | ICD-10-CM | POA: Diagnosis not present

## 2020-01-19 DIAGNOSIS — R531 Weakness: Secondary | ICD-10-CM | POA: Insufficient documentation

## 2020-01-19 DIAGNOSIS — R5383 Other fatigue: Secondary | ICD-10-CM | POA: Diagnosis not present

## 2020-01-19 DIAGNOSIS — I130 Hypertensive heart and chronic kidney disease with heart failure and stage 1 through stage 4 chronic kidney disease, or unspecified chronic kidney disease: Secondary | ICD-10-CM | POA: Insufficient documentation

## 2020-01-19 DIAGNOSIS — E162 Hypoglycemia, unspecified: Secondary | ICD-10-CM

## 2020-01-19 DIAGNOSIS — R55 Syncope and collapse: Secondary | ICD-10-CM | POA: Diagnosis not present

## 2020-01-19 DIAGNOSIS — Z79899 Other long term (current) drug therapy: Secondary | ICD-10-CM | POA: Diagnosis not present

## 2020-01-19 LAB — URINALYSIS, ROUTINE W REFLEX MICROSCOPIC
Bacteria, UA: NONE SEEN
Bilirubin Urine: NEGATIVE
Glucose, UA: 50 mg/dL — AB
Hgb urine dipstick: NEGATIVE
Ketones, ur: NEGATIVE mg/dL
Leukocytes,Ua: NEGATIVE
Nitrite: NEGATIVE
Protein, ur: >=300 mg/dL — AB
Specific Gravity, Urine: 1.009 (ref 1.005–1.030)
pH: 8 (ref 5.0–8.0)

## 2020-01-19 LAB — CBC WITH DIFFERENTIAL/PLATELET
Abs Immature Granulocytes: 0.06 10*3/uL (ref 0.00–0.07)
Basophils Absolute: 0 10*3/uL (ref 0.0–0.1)
Basophils Relative: 1 %
Eosinophils Absolute: 0.4 10*3/uL (ref 0.0–0.5)
Eosinophils Relative: 6 %
HCT: 37.9 % — ABNORMAL LOW (ref 39.0–52.0)
Hemoglobin: 12.4 g/dL — ABNORMAL LOW (ref 13.0–17.0)
Immature Granulocytes: 1 %
Lymphocytes Relative: 29 %
Lymphs Abs: 1.8 10*3/uL (ref 0.7–4.0)
MCH: 28.8 pg (ref 26.0–34.0)
MCHC: 32.7 g/dL (ref 30.0–36.0)
MCV: 88.1 fL (ref 80.0–100.0)
Monocytes Absolute: 0.5 10*3/uL (ref 0.1–1.0)
Monocytes Relative: 7 %
Neutro Abs: 3.6 10*3/uL (ref 1.7–7.7)
Neutrophils Relative %: 56 %
Platelets: 326 10*3/uL (ref 150–400)
RBC: 4.3 MIL/uL (ref 4.22–5.81)
RDW: 12.6 % (ref 11.5–15.5)
WBC: 6.4 10*3/uL (ref 4.0–10.5)
nRBC: 0 % (ref 0.0–0.2)

## 2020-01-19 LAB — TROPONIN I (HIGH SENSITIVITY)
Troponin I (High Sensitivity): 6 ng/L (ref <18)
Troponin I (High Sensitivity): 6 ng/L (ref <18)

## 2020-01-19 LAB — CBG MONITORING, ED: Glucose-Capillary: 139 mg/dL — ABNORMAL HIGH (ref 70–99)

## 2020-01-19 LAB — BASIC METABOLIC PANEL
Anion gap: 12 (ref 5–15)
BUN: 25 mg/dL — ABNORMAL HIGH (ref 8–23)
CO2: 28 mmol/L (ref 22–32)
Calcium: 9.2 mg/dL (ref 8.9–10.3)
Chloride: 92 mmol/L — ABNORMAL LOW (ref 98–111)
Creatinine, Ser: 0.88 mg/dL (ref 0.61–1.24)
GFR calc Af Amer: >60 mL/min (ref >60)
GFR calc non Af Amer: >60 mL/min (ref >60)
Glucose, Bld: 56 mg/dL — ABNORMAL LOW (ref 70–99)
Potassium: 3.9 mmol/L (ref 3.5–5.1)
Sodium: 132 mmol/L — ABNORMAL LOW (ref 135–145)

## 2020-01-19 MED ORDER — DEXTROSE 50 % IV SOLN
12.5000 g | Freq: Once | INTRAVENOUS | Status: AC
  Administered 2020-01-19: 07:00:00 12.5 g via INTRAVENOUS
  Filled 2020-01-19: qty 50

## 2020-01-19 NOTE — ED Provider Notes (Signed)
[facility] Provider Note   CSN: [id number] Arrival date & time: 01/19/20  0532     History Chief Complaint  Patient presents with  . Weakness    Edward [name] is a 85 y.o. male.  Patient presents to the emergency department with generalized weakness.  He reports that he woke up this morning and did not feel like his normal self.  He felt very shaky.  He checked his sugar and it was 137.  He reports that when he was moving around he felt like he was going to pass out.  No chest pain or unusual shortness of breath.  He is on 3 L nasal cannula oxygen at all times.  He was on his oxygen at the time of onset of symptoms.        Past Medical History:  Diagnosis Date  . Allergy    Rhinitis  . Atrial fibrillation ([place])   . Bronchitis   . Chronic respiratory failure ([place])   . Colon polyps   . COPD (chronic obstructive pulmonary disease) ([place])   . Diabetes mellitus   . Elevated lipids   . Hypercholesterolemia   . Hypertension   . Iron deficiency anemia due to chronic blood loss 04/30/2018  . Noncompliance   . On home O2    2L N/C   . PSA elevation   . Pulmonary fibrosis ([place])   . Vitamin D deficiency     Patient Active Problem List   Diagnosis Date Noted  . Mixed hyperlipidemia 03/06/2019  . Parotitis 11/01/2018  . Syncope 10/30/2018  . Facial cellulitis 10/30/2018  . Chronic respiratory failure with hypoxia ([place]) 10/30/2018  . Chronic diastolic heart failure ([place])   . Hypoxia   . Atrial fibrillation ([place])   . COPD with acute exacerbation ([place]) 07/31/2018  . Hyponatremia 07/31/2018  . COPD (chronic obstructive pulmonary disease) ([place]) 05/21/2018  . Stage 3 chronic kidney disease 05/21/2018  . Diastolic congestive heart failure ([place]) 05/21/2018  . Type 2 diabetes mellitus ([place]) 05/21/2018  . HTN (hypertension) 05/21/2018  . Iron deficiency anemia due to chronic blood loss 04/30/2018  . Respiratory distress   . Hematochezia   . Goals of care,  counseling/discussion   . Palliative care by specialist   . DNR (do not resuscitate) discussion   . Acute renal failure with acute tubular necrosis superimposed on stage 3 chronic kidney disease ([place])   . Palliative care encounter   . COPD exacerbation ([place]) 04/17/2018  . Acute on chronic diastolic CHF (congestive heart failure) ([place])   . Severe anemia 02/17/2018  . Bradycardia 02/17/2018  . Hypothermia   . Gastroesophageal reflux disease   . Acute encephalopathy 09/25/2017  . On home O2 09/25/2017  . Bilateral lower extremity edema 06/27/2017  . Insomnia 03/28/2017  . CKD (chronic kidney disease), stage III 03/07/2017  . AF (paroxysmal atrial fibrillation) ([place]) 03/05/2017  . Chronic diastolic CHF (congestive heart failure) ([place]) 03/04/2017  . HCAP (healthcare-associated pneumonia) 03/04/2017  . Sepsis due to pneumonia ([place]) 02/25/2017  . Constipation 02/25/2017  . Overflow diarrhea/Constipation 02/25/2017  . DM type 2 causing vascular disease ([place]) 10/11/2016  . Non compliance w medication regimen 12/02/2015  . Hypercholesterolemia 05/12/2014  . Acute on chronic respiratory failure with hypoxemia ([place]) 11/17/2013  . Elevated PSA 01/20/2013  . Hypertension   . Elevated lipids   . Pulmonary fibrosis ([place])   . Colon polyps   . Colon polyps     Past Surgical History:  Procedure Laterality Date  . BIOPSY  04/23/2018   Procedure: BIOPSY;  Surgeon: Danie Binder, MD;  Location: [facility];  Service: Endoscopy;;  duodenum gastric  . CATARACT EXTRACTION W/PHACO  06/25/2012   Procedure: CATARACT EXTRACTION PHACO AND INTRAOCULAR LENS PLACEMENT (IOC);  Surgeon: Elta Guadeloupe T. Gershon Crane, MD;  Location: [facility] ORS;  Service: Ophthalmology;  Laterality: Left;  CDE=19.01  . CATARACT EXTRACTION W/PHACO  07/09/2012   Procedure: CATARACT EXTRACTION PHACO AND INTRAOCULAR LENS PLACEMENT (IOC);  Surgeon: Elta Guadeloupe T. Gershon Crane, MD;  Location: [facility] OR[facility];  Service: Ophthalmology;  Laterality: Right;  CDE: 20.09  .  COLONOSCOPY WITH PROPOFOL N/A 04/23/2018   Procedure: COLONOSCOPY WITH PROPOFOL;  Surgeon: Danie Binder, MD;  Location: [facility];  Service: Endoscopy;  Laterality: N/A;  . ESOPHAGOGASTRODUODENOSCOPY (EGD) WITH PROPOFOL N/A 04/23/2018   Procedure: ESOPHAGOGASTRODUODENOSCOPY (EGD) WITH PROPOFOL;  Surgeon: Danie Binder, MD;  Location: [facility];  Service: Endoscopy;  Laterality: N/A;       Family History  Problem Relation Age of Onset  . Heart disease Mother   . CAD Other   . Diabetes Other     Social History   Tobacco Use  . Smoking status: Former Smoker    Packs/day: 1.50    Years: 60.00    Pack years: 90.00    Types: Cigarettes    Quit date: 12/31/2012    Years since quitting: 7.0  . Smokeless tobacco: Never Used  Substance Use Topics  . Alcohol use: No  . Drug use: No    Home Medications Prior to Admission medications   Medication Sig Start Date End Date Taking? Authorizing Provider  albuterol (PROVENTIL) (2.5 MG/3ML) 0.083% nebulizer solution INHALE 1 VIAL VIA NEBULIZER EVERY 6 HOURS AS NEEDED FOR WHEEZING OR SHORTNESS OF BREATH 08/12/19   Susy Frizzle, MD  azithromycin (ZITHROMAX) 250 MG tablet Take 1 tablet (250 mg total) by mouth daily. Take first 2 tablets together, then 1 every day until finished. 12/27/19   Virgel Manifold, MD  budesonide-formoterol [facility]) 160-4.5 MCG/ACT inhaler Inhale 2 puffs into the lungs 2 (two) times daily. 01/13/20   Susy Frizzle, MD  cloNIDine (CATAPRES) 0.1 MG tablet TAKE 1 TABLET BY MOUTH 2 TIMES A DAY 07/11/19   Susy Frizzle, MD  diltiazem (CARDIZEM CD) 240 MG 24 hr capsule TAKE 1 CAPSULE BY MOUTH DAILY Patient not taking: Reported on 12/29/2019 10/30/18   Susy Frizzle, MD  ELIQUIS 5 MG TABS tablet Take 1 tablet by mouth twice daily 10/24/19   Susy Frizzle, MD  furosemide (LASIX) 40 MG tablet TAKE 1 TABLET BY MOUTH DAILY 12/22/19   Susy Frizzle, MD  HYDROcodone-acetaminophen (NORCO) 5-325 MG tablet  Take 1 tablet by mouth every 6 (six) hours as needed for up to 10 days for moderate pain. 01/13/20 01/23/20  Susy Frizzle, MD  Insulin Glargine (BASAGLAR KWIKPEN) 100 UNIT/ML SOPN Inject 0.3 mLs (30 Units total) into the skin at bedtime. 07/21/19   Susy Frizzle, MD  Insulin Pen Needle (PEN NEEDLES) 31G X 6 MM MISC Use with insulin qd 10/13/19   Susy Frizzle, MD  ipratropium-albuterol (DUONEB) 0.5-2.5 (3) MG/3ML SOLN Take 3 mLs by nebulization every 6 (six) hours as needed. 12/30/19   Susy Frizzle, MD  Lancets (ONETOUCH DELICA PLUS 123XX123) MISC USE TO CHECK BLOOD SUGAR TWICE DAILY AS DIRECTED 01/30/19   Susy Frizzle, MD  LORazepam (ATIVAN) 0.5 MG tablet TAKE 1 TABLET BY MOUTH ONCE  DAILY AT BEDTIME AS NEEDED FOR SLEEP 10/27/19   Susy Frizzle, MD  metFORMIN (GLUCOPHAGE) 500 MG tablet TAKE 1 TABLET BY MOUTH TWICE DAILY WITH MEALS 12/16/19   [name], [name], MD  metoprolol tartrate (LOPRESSOR) 25 MG tablet TAKE 1 TABLET BY MOUTH TWICE DAILY 07/11/19   Susy Frizzle, MD  OXYGEN Inhale 3 L into the lungs continuous.     [provider]  potassium chloride SA (KLOR-CON) 20 MEQ tablet TAKE 1 TABLET BY MOUTH DAILY WITH LASIX 07/11/19   Susy Frizzle, MD  pravastatin (PRAVACHOL) 80 MG tablet TAKE 1 TABLET BY MOUTH AT BEDTIME 11/17/19   Susy Frizzle, MD  predniSONE (DELTASONE) 20 MG tablet Take 3 tablets (60 mg total) by mouth daily with breakfast. 12/30/19   Susy Frizzle, MD  valACYclovir (VALTREX) 1000 MG tablet Take 1 tablet (1,000 mg total) by mouth 3 (three) times daily. 01/13/20   Susy Frizzle, MD    Allergies    Ace inhibitors  Review of Systems   Review of Systems  Neurological: Positive for weakness.  All other systems reviewed and are negative.   Physical Exam Updated Vital Signs BP (!) 190/96 (BP Location: Right Arm)   Pulse 89   Resp 18   Ht 5\' 5"  (1.651 m)   Wt 75 kg   SpO2 100%   BMI 27.51 kg/m   Physical Exam Vitals and  nursing note reviewed.  Constitutional:      General: He is not in acute distress.    Appearance: Normal appearance. He is well-developed.  HENT:     Head: Normocephalic and atraumatic.     Right Ear: Hearing normal.     Left Ear: Hearing normal.     Nose: Nose normal.  Eyes:     Conjunctiva/sclera: Conjunctivae normal.     Pupils: Pupils are equal, round, and reactive to light.  Cardiovascular:     Rate and Rhythm: Regular rhythm.     Heart sounds: S1 normal and S2 normal. No murmur. No friction rub. No gallop.   Pulmonary:     Effort: Pulmonary effort is normal. No respiratory distress.     Breath sounds: Normal breath sounds.  Chest:     Chest wall: No tenderness.  Abdominal:     General: Bowel sounds are normal.     Palpations: Abdomen is soft.     Tenderness: There is no abdominal tenderness. There is no guarding or rebound. Negative signs include Murphy's sign and McBurney's sign.     Hernia: No hernia is present.  Musculoskeletal:        General: Normal range of motion.     Cervical back: Normal range of motion and neck supple.  Skin:    General: Skin is warm and dry.     Findings: No rash.  Neurological:     Mental Status: He is alert and oriented to person, place, and time.     GCS: GCS eye subscore is 4. GCS verbal subscore is 5. GCS motor subscore is 6.     Cranial Nerves: No cranial nerve deficit.     Sensory: No sensory deficit.     Coordination: Coordination normal.  Psychiatric:        Speech: Speech normal.        Behavior: Behavior normal.        Thought Content: Thought content normal.     ED Results / Procedures / Treatments   Labs (all labs ordered  are listed, but only abnormal results are displayed) Labs Reviewed  CBC WITH DIFFERENTIAL/PLATELET - Abnormal; Notable for the following components:      Result Value   Hemoglobin 12.4 (*)    HCT 37.9 (*)    All other components within normal limits  BASIC METABOLIC PANEL - Abnormal; Notable for the  following components:   Sodium 132 (*)    Chloride 92 (*)    Glucose, Bld 56 (*)    BUN 25 (*)    All other components within normal limits  URINALYSIS, ROUTINE W REFLEX MICROSCOPIC  TROPONIN I (HIGH SENSITIVITY)    EKG EKG Interpretation  Date/Time:  Monday January 19 2020 05:44:42 EDT Ventricular Rate:  89 PR Interval:    QRS Duration: 79 QT Interval:  371 QTC Calculation: 452 R Axis:   46 Text Interpretation: Sinus rhythm No significant change since last tracing Confirmed by Orpah Greek [phone]) on 01/19/2020 5:50:31 AM   Radiology DG Chest Port 1 View  Result Date: 01/19/2020 CLINICAL DATA:  85 year old male with weakness and presyncope on waking today. EXAM: PORTABLE CHEST 1 VIEW COMPARISON:  Portable chest 12/27/2019 and earlier. FINDINGS: Portable AP upright view at 0604 hours. Stable lung volumes. Mediastinal contours remain within normal limits. Visualized tracheal air column is within normal limits. Coarse bilateral pulmonary interstitial opacity appears chronic along with mid and right lower lung areas of more pronounced scarring, similar scarring at the left lung base. No pneumothorax, pulmonary edema, pleural effusion or new pulmonary opacity identified. Negative visible bowel gas pattern. No acute osseous abnormality identified. IMPRESSION: Chronic interstitial lung changes with suspected multifocal scarring. No acute cardiopulmonary abnormality identified. Electronically Signed   By: Genevie Ann M.D.   On: 01/19/2020 06:12    Procedures Procedures (including critical care time)  Medications Ordered in ED Medications  dextrose 50 % solution 12.5 g (12.5 g Intravenous Given 01/19/20 [id number])    ED Course  I have reviewed the triage vital signs and the nursing notes.  Pertinent labs & imaging results that were available during my care of the patient were reviewed by me and considered in my medical decision making (see chart for details).    MDM  Rules/Calculators/A&P                      Patient presents to the emergency department with generalized weakness and feeling very shaky.  He thought it might be his blood sugar at home but his sugar was 130.  Upon arrival to the ER, however, blood sugar is now 56.  He was given dextrose.  We will also feed and will need to monitor for recurrent hypoglycemia.  Electrolytes are otherwise unremarkable.  CBC is normal, no leukocytosis.  Slightly low at 12.4, but this is his baseline.  He was 12.5 a month ago.  Patient does not have any focal findings on neurologic exam.  Will perform CT head to further evaluate.  Patient not experiencing acute chest pain, no shortness of breath.  Will perform cardiac evaluation, initial EKG does not show any acute findings.  Will cycle troponins.  No evidence of PE, patient is on Eliquis. Will sign out to oncoming ER physician to follow results and disposition.  Final Clinical Impression(s) / ED Diagnoses Final diagnoses:  Weakness  Hypoglycemia  Near syncope    Rx / DC Orders ED Discharge Orders    None       Ethelean Colla, Gwenyth Allegra, MD 01/19/20 [phone]

## 2020-01-19 NOTE — ED Notes (Signed)
Pt was given peanut butter crackers and sprite.  BG reading was 139   Bed in lowest position, call bell in reach, side rails up, and fall risk bracelet on, side table at bedside.

## 2020-01-19 NOTE — ED Triage Notes (Signed)
Pt states he woke up this morning "feeling funny and like he was going to pass out". Pt states he was "shaky' so he checked his blood glucose and it was 137. Pt wears O2 at 3l [place] at all times.

## 2020-01-20 ENCOUNTER — Other Ambulatory Visit: Payer: Self-pay | Admitting: Family Medicine

## 2020-01-21 ENCOUNTER — Other Ambulatory Visit: Payer: Self-pay | Admitting: *Deleted

## 2020-01-21 NOTE — Patient Outreach (Signed)
[facility]) Care Management  01/21/2020  Edward [name] 11/27/1934 [id number]   [place] other MonthOutreach  Referral Date: 10/15/2018 Referral Source: Transfer from [place] Reason for Referral: Continued Disease Management Education Insurance:[employer] Medicare   Outreach Attempt:  Outreach attempt #3 to patient for follow up. No answer and unable to leave voicemail message due to voicemail not engaging.  Plan:  RN Health Coach will make another outreach attempt within the month of May if no return call back from patient.  [place] [phone] [email or web address]

## 2020-01-22 ENCOUNTER — Other Ambulatory Visit: Payer: Self-pay | Admitting: Pharmacy Technician

## 2020-01-22 NOTE — Patient Outreach (Signed)
[facility]) Care Management  01/22/2020  Edward [name] September 16, 1935 [id number]    Successful call placed to patient's son [name] regarding patient assistance application for Symbicort with [employer], receipt of Dulera from [employer] and inquiring if he found the LIS determination letter as Edward [name] is requiring that information, HIPAA identifiers verified.   [name] informed they received 3 Dulera inhalers from [employer] patient assistance program. He also informed he received the AZ[employer]ME application, he believes, as they mailed back some "kind of form" yesterday though he was not sure what form or where it was going back to. He also does not recall receiving the Spiriva from the patient assistance program but he informed that does not mean it was not received. It was confirmed with [employer] representative Nisha that the inhalers were delivered to the mailbox on 12/16/2019. Also inquired if he had asked his mother about the Medicare Extra Help/LIS determination letter that indicated patient was receiving partial help as reported in 2020. He informed they ahd not found the letter yet but would look again. Informed [name] that we may need to apply for LIS again if the letter could not be found so that patient could continue to receive Spiriva from the patietn assistance company. [name] said he would call me back.  Follow up:  Will route note to [facility] for case closure if document(s) have not been received in the next 15 business days.  Isaura Schiller P. Ailani [place], [place]  [phone]

## 2020-01-23 ENCOUNTER — Other Ambulatory Visit: Payer: Self-pay

## 2020-01-23 ENCOUNTER — Ambulatory Visit (INDEPENDENT_AMBULATORY_CARE_PROVIDER_SITE_OTHER): Payer: Medicare Other | Admitting: Family Medicine

## 2020-01-23 ENCOUNTER — Encounter: Payer: Self-pay | Admitting: Family Medicine

## 2020-01-23 VITALS — BP 150/86 | HR 86 | Temp 97.7°F | Resp 22 | Ht 65.0 in | Wt 158.0 lb

## 2020-01-23 DIAGNOSIS — E1165 Type 2 diabetes mellitus with hyperglycemia: Secondary | ICD-10-CM

## 2020-01-23 DIAGNOSIS — B028 Zoster with other complications: Secondary | ICD-10-CM

## 2020-01-23 MED ORDER — HYDROCODONE-ACETAMINOPHEN 5-325 MG PO TABS: 1.0000 | ORAL_TABLET | Freq: Four times a day (QID) | ORAL | 0 refills | Status: AC | PRN

## 2020-01-23 MED ORDER — METFORMIN HCL 500 MG PO TABS: 500.0000 mg | ORAL_TABLET | Freq: Two times a day (BID) | ORAL | 5 refills | Status: DC

## 2020-01-23 NOTE — Progress Notes (Signed)
Subjective:    Patient ID: Edward [name], male    DOB: 10-09-1934, 85 y.o.   MRN: [id number]  HPI  01/13/20 Patient was seen in the emergency room on April 10 for severe pain in his right arm.  At that time there was no rash.  Presumptive diagnosis was cervical radiculopathy.  CT scanning was obtained of the cervical spine for further evaluation.  Patient had mild foraminal narrowing on the right and moderate foraminal narrowing on the left but no obvious nerve impingement.  Started on hydrocodone.  Over the last day he has developed a rash in his right upper extremity.  Please see the photograph below.  Patient reports burning stinging fire-like pain radiating up and down his right arm from his neck to his fingertips.  The pain is severe.  He is about out of hydrocodone that was given to him in the emergency room.  Rash is characterized by numerous erythematous papules coalescing into plaques on the length of the arm from his shoulder to his hand.  There are numerous erythematous nodules.  There are no visible vesicles.  At that time, my plan was: Begin Valtrex 1 g p.o. 3 times daily for 7 days.  Refill hydrocodone 5/325 1 p.o. every 6 hours number 40 tablets.  Reassess in 1 week.  If pain worsens, consider adding gabapentin.  01/23/20 Patient states that he is hurting in his neck down his right shoulder into his right arm all the way to his right fingertips.  He is questioning why this is happening.  The rash that I saw him last office visit has turned into scabbed papules consistent with resolving shingles.  The vesicles are beginning to scab over and dissipate.  However the patient states the pain is no better.  He also recently went to the emergency room with weakness and was found to have a blood sugar in the 50s.  He takes 30 units of Basaglar at night prior to bed.  Sometimes he forgets to take a snack.  His last hemoglobin A1c was near 11 suggesting that his average blood sugar is close to 300.   However he states that his fasting blood sugars in the morning are quite often in the 40s and 50s!.  I question the veracity of this however I do think that this is a patient that has a high risk of hypoglycemia and therefore taking his insulin at night is a poor choice.  Past Medical History:  Diagnosis Date  . Allergy    Rhinitis  . Atrial fibrillation ([place])   . Bronchitis   . Chronic respiratory failure ([place])   . Colon polyps   . COPD (chronic obstructive pulmonary disease) ([place])   . Diabetes mellitus   . Elevated lipids   . Hypercholesterolemia   . Hypertension   . Iron deficiency anemia due to chronic blood loss 04/30/2018  . Noncompliance   . On home O2    2L N/C   . PSA elevation   . Pulmonary fibrosis ([place])   . Vitamin D deficiency    Past Surgical History:  Procedure Laterality Date  . BIOPSY  04/23/2018   Procedure: BIOPSY;  Surgeon: Danie Binder, MD;  [facility];  Service: Endoscopy;;  duodenum gastric  . CATARACT EXTRACTION W/PHACO  06/25/2012   Procedure: CATARACT EXTRACTION PHACO AND INTRAOCULAR LENS PLACEMENT (IOC);  Surgeon: Elta Guadeloupe T. Gershon Crane, MD;  Location: [facility];  Service: Ophthalmology;  Laterality: Left;  CDE=19.01  .  CATARACT EXTRACTION W/PHACO  07/09/2012   Procedure: CATARACT EXTRACTION PHACO AND INTRAOCULAR LENS PLACEMENT (IOC);  Surgeon: Elta Guadeloupe T. Gershon Crane, MD;  Location: [facility];  Service: Ophthalmology;  Laterality: Right;  CDE: 20.09  . COLONOSCOPY WITH PROPOFOL N/A 04/23/2018   Procedure: COLONOSCOPY WITH PROPOFOL;  Surgeon: Danie Binder, MD;  [facility];  Service: Endoscopy;  Laterality: N/A;  . ESOPHAGOGASTRODUODENOSCOPY (EGD) WITH PROPOFOL N/A 04/23/2018   Procedure: ESOPHAGOGASTRODUODENOSCOPY (EGD) WITH PROPOFOL;  Surgeon: Danie Binder, MD;  [facility]: [facility];  Service: Endoscopy;  Laterality: N/A;   Current Outpatient Medications on File Prior to Visit  Medication Sig Dispense Refill  . albuterol (PROVENTIL) (2.5  MG/3ML) 0.083% nebulizer solution INHALE 1 VIAL VIA NEBULIZER EVERY 6 HOURS AS NEEDED FOR WHEEZING OR SHORTNESS OF BREATH 360 mL 4  . azithromycin (ZITHROMAX) 250 MG tablet Take 1 tablet (250 mg total) by mouth daily. Take first 2 tablets together, then 1 every day until finished. 6 tablet 0  . budesonide-formoterol (SYMBICORT) 160-4.5 MCG/ACT inhaler Inhale 2 puffs into the lungs 2 (two) times daily. 1 Inhaler 3  . cloNIDine (CATAPRES) 0.1 MG tablet TAKE 1 TABLET BY MOUTH 2 TIMES A DAY 60 tablet 3  . diltiazem (CARDIZEM CD) 240 MG 24 hr capsule TAKE 1 CAPSULE BY MOUTH DAILY (Patient not taking: Reported on 12/29/2019) 90 capsule 4  . ELIQUIS 5 MG TABS tablet Take 1 tablet by mouth twice daily 60 tablet 3  . furosemide (LASIX) 40 MG tablet TAKE 1 TABLET BY MOUTH DAILY 30 tablet 3  . HYDROcodone-acetaminophen (NORCO) 5-325 MG tablet Take 1 tablet by mouth every 6 (six) hours as needed for up to 10 days for moderate pain. 40 tablet 0  . Insulin Glargine (BASAGLAR KWIKPEN) 100 UNIT/ML SOPN Inject 0.3 mLs (30 Units total) into the skin at bedtime. 10 pen 3  . Insulin Pen Needle (PEN NEEDLES) 31G X 6 MM MISC Use with insulin qd 100 each 2  . ipratropium-albuterol (DUONEB) 0.5-2.5 (3) MG/3ML SOLN Take 3 mLs by nebulization every 6 (six) hours as needed. 360 mL 1  . Lancets (ONETOUCH DELICA PLUS [id number]) MISC USE TO CHECK BLOOD SUGAR TWICE DAILY AS DIRECTED 100 each 2  . LORazepam (ATIVAN) 0.5 MG tablet TAKE 1 TABLET BY MOUTH ONCE DAILY AT BEDTIME AS NEEDED FOR SLEEP 30 tablet 1  . metFORMIN (GLUCOPHAGE) 500 MG tablet TAKE 1 TABLET BY MOUTH TWICE DAILY WITH MEALS 60 tablet 0  . metoprolol tartrate (LOPRESSOR) 25 MG tablet TAKE 1 TABLET BY MOUTH TWICE DAILY 60 tablet 3  . OXYGEN Inhale 3 L into the lungs continuous.     . potassium chloride SA (KLOR-CON) 20 MEQ tablet TAKE 1 TABLET BY MOUTH DAILY WITH LASIX 30 tablet 2  . pravastatin (PRAVACHOL) 80 MG tablet TAKE 1 TABLET BY MOUTH AT BEDTIME 30 tablet 3    . predniSONE (DELTASONE) 20 MG tablet Take 3 tablets (60 mg total) by mouth daily with breakfast. 15 tablet 0  . valACYclovir (VALTREX) 1000 MG tablet Take 1 tablet (1,000 mg total) by mouth 3 (three) times daily. 21 tablet 0   No current facility-administered medications on file prior to visit.   Allergies  Allergen Reactions  . Ace Inhibitors Other (See Comments)    Hyperkalemia--07/23/2013:patient states not familiar with the following allergy   Social History   Socioeconomic History  . Marital status: Married    Spouse name: Not on file  . Number of children: Not on file  .  Years of education: Not on file  . Highest education level: Not on file  Occupational History  . 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 Smoking status: Former Smoker    Packs/day: 1.50    Years: 60.00    Pack years: 90.00    Types: Cigarettes    Quit date: 12/31/2012    Years since quitting: 7.0  . Smokeless tobacco: Never Used  Substance and Sexual Activity  . Alcohol use: No  . Drug use: No  . Sexual activity: Yes    Birth control/protection: None  Other Topics Concern  . Not on file  Social History Narrative  . Not on file   Social Determinants of Health   Financial Resource Strain:   . Difficulty of Paying Living Expenses:   Food Insecurity:   . Worried About Charity fundraiser in the Last Year:   . Arboriculturist in the Last Year:   Transportation Needs: No Transportation Needs  . Lack of Transportation (Medical): No  . Lack of Transportation (Non-Medical): No  Physical Activity: Inactive  . Days of Exercise per Week: 0 days  . Minutes of Exercise per Session: 0 min  Stress: No Stress Concern Present  . Feeling of Stress : Only a little  Social Connections:   . Frequency of Communication with Friends and Family:   . Frequency of Social Gatherings with Friends and Family:   . Attends Religious Services:   . Active Member of Clubs or Organizations:    . Attends Archivist Meetings:   Marland Kitchen Marital Status:   Intimate Partner Violence:   . Fear of Current or Ex-Partner:   . Emotionally Abused:   Marland Kitchen Physically Abused:   . Sexually Abused:      Review of Systems  All other systems reviewed and are negative.      Objective:   Physical Exam Vitals reviewed.  Constitutional:      General: He is not in acute distress.    Appearance: Normal appearance. He is obese. He is not ill-appearing, toxic-appearing or diaphoretic.  HENT:     Nose: Nose normal. No congestion or rhinorrhea.  Cardiovascular:     Rate and Rhythm: Normal rate and regular rhythm.     Heart sounds: Normal heart sounds. No murmur. No gallop.   Pulmonary:     Effort: Pulmonary effort is normal. Prolonged expiration present. No tachypnea, accessory muscle usage, respiratory distress or retractions.     Breath sounds: Decreased air movement present. No stridor. Examination of the right-upper field reveals decreased breath sounds and wheezing. Examination of the left-upper field reveals decreased breath sounds and wheezing. Examination of the right-lower field reveals decreased breath sounds and wheezing. Examination of the left-lower field reveals decreased breath sounds and wheezing. Decreased breath sounds and wheezing present. No rhonchi or rales.  Musculoskeletal:     Right lower leg: No edema.     Left lower leg: No edema.  Skin:    Findings: Rash present. Rash is macular and papular.       Neurological:     Mental Status: He is alert.           Assessment & Plan:  Herpes zoster with complication  Uncontrolled type 2 diabetes mellitus with hyperglycemia ([name]) - Plan: Hemoglobin A1c, CBC with Differential/Platelet, COMPLETE METABOLIC PANEL WITH GFR, Microalbumin, urine  Diabetes sounds poorly controlled.  I have recommended that the patient begin Lantus 30 units  in the morning to avoid early a.m. hypoglycemia.  I have asked him to check his blood  sugars fasting and 2 hours postprandial and notify me in 1 week with how sugars are running since he switches his insulin to a different time of day.  I will also check his hemoglobin A1c.  Patient is a poor candidate for mealtime insulin.  However he may benefit from an insulin sensitizer such as Actos.  Shingles is slowly resolving.  I did refill his pain medication.

## 2020-01-24 LAB — COMPLETE METABOLIC PANEL WITH GFR
AG Ratio: 1.4 (calc) (ref 1.0–2.5)
ALT: 9 U/L (ref 9–46)
AST: 10 U/L (ref 10–35)
Albumin: 3.5 g/dL — ABNORMAL LOW (ref 3.6–5.1)
Alkaline phosphatase (APISO): 74 U/L (ref 35–144)
BUN: 24 mg/dL (ref 7–25)
CO2: 28 mmol/L (ref 20–32)
Calcium: 9 mg/dL (ref 8.6–10.3)
Chloride: 93 mmol/L — ABNORMAL LOW (ref 98–110)
Creat: 0.88 mg/dL (ref 0.70–1.11)
GFR, Est African American: 93 mL/min/{1.73_m2} (ref >=60)
GFR, Est Non African American: 80 mL/min/{1.73_m2} (ref >=60)
Globulin: 2.5 g/dL (calc) (ref 1.9–3.7)
Glucose, Bld: 182 mg/dL — ABNORMAL HIGH (ref 65–99)
Potassium: 4.5 mmol/L (ref 3.5–5.3)
Sodium: 131 mmol/L — ABNORMAL LOW (ref 135–146)
Total Bilirubin: 0.3 mg/dL (ref 0.2–1.2)
Total Protein: 6 g/dL — ABNORMAL LOW (ref 6.1–8.1)

## 2020-01-24 LAB — CBC WITH DIFFERENTIAL/PLATELET
Absolute Monocytes: 654 cells/uL (ref 200–950)
Basophils Absolute: 69 cells/uL (ref 0–200)
Basophils Relative: 0.8 %
Eosinophils Absolute: 482 cells/uL (ref 15–500)
Eosinophils Relative: 5.6 %
HCT: 35.2 % — ABNORMAL LOW (ref 38.5–50.0)
Hemoglobin: 11.9 g/dL — ABNORMAL LOW (ref 13.2–17.1)
Lymphs Abs: 2004 cells/uL (ref 850–3900)
MCH: 29.1 pg (ref 27.0–33.0)
MCHC: 33.8 g/dL (ref 32.0–36.0)
MCV: 86.1 fL (ref 80.0–100.0)
MPV: 9.9 fL (ref 7.5–12.5)
Monocytes Relative: 7.6 %
Neutro Abs: 5392 cells/uL (ref 1500–7800)
Neutrophils Relative %: 62.7 %
Platelets: 322 10*3/uL (ref 140–400)
RBC: 4.09 10*6/uL — ABNORMAL LOW (ref 4.20–5.80)
RDW: 13.1 % (ref 11.0–15.0)
Total Lymphocyte: 23.3 %
WBC: 8.6 10*3/uL (ref 3.8–10.8)

## 2020-01-24 LAB — HEMOGLOBIN A1C
Hgb A1c MFr Bld: 11.8 % of total Hgb — ABNORMAL HIGH (ref <5.7)
Mean Plasma Glucose: 292 (calc)
eAG (mmol/L): 16.2 (calc)

## 2020-01-24 LAB — MICROALBUMIN, URINE: Microalb, Ur: 378.4 mg/dL

## 2020-01-27 ENCOUNTER — Other Ambulatory Visit: Payer: Self-pay | Admitting: Pharmacy Technician

## 2020-01-27 NOTE — Patient Outreach (Signed)
[name]  01/27/2020  Edward [name] [birth year] [id number]   Received both patient and provider portion(s) of patient assistance application(s) for Symbicort. Faxed completed application and required documents into [employer]   Will follow up with company(ies) in 5-15 business days to check status of application(s).  [name], [name]  [phone]

## 2020-01-28 ENCOUNTER — Other Ambulatory Visit: Payer: Self-pay | Admitting: *Deleted

## 2020-01-28 ENCOUNTER — Encounter: Payer: Self-pay | Admitting: *Deleted

## 2020-01-28 ENCOUNTER — Telehealth: Payer: Self-pay | Admitting: Family Medicine

## 2020-01-28 ENCOUNTER — Other Ambulatory Visit: Payer: Self-pay | Admitting: Family Medicine

## 2020-01-28 NOTE — Chronic Care Management (AMB) (Signed)
  Chronic Care Management   Note  01/28/2020 Name: Edward [name] MRN: [id number] DOB: 1935-04-27  Edward [name] is a 85 y.o. year old male who is a primary care patient of Dennard Schaumann, [name], MD. I reached out to [place] by phone today in response to a referral sent by [name]'s PCP, Dennard Schaumann [name], MD.   [name] was given information about Chronic Care Management services today including:  1. CCM service includes personalized support from designated clinical staff supervised by his physician, including individualized plan of care and coordination with other care providers 2. 24/7 contact phone numbers for assistance for urgent and routine care needs. 3. Service will only be billed when office clinical staff spend 20 minutes or more in a month to coordinate care. 4. Only one practitioner may furnish and bill the service in a calendar month. 5. The patient may stop CCM services at any time (effective at the end of the month) by phone call to the office staff.   Patient agreed to services and verbal consent obtained.   Follow up plan:   [place]

## 2020-01-28 NOTE — Patient Outreach (Signed)
[facility]) Care Management  [place]  01/28/2020   [place] IMRE VECCHIONE November 26, 1934 [id number]   [place] Every other Month Outreach   Referral Date:  10/15/2018 Referral Source:  Transfer from [facility] Reason for Referral:  Continued Disease Management Education Insurance:  [employer]    Outreach Attempt:  Successful telephone outreach to patient for follow up.  HIPAA verified with patient.  Patient reporting he is doing better since recent emergency room visits for COPD exacerbations and shingles.  Reports compliance with oxygen at 3 liters continuous.  He and son unable to verbalize which inhaler he takes daily (rescue versus maintenance).  Denies any shortness of breath at this time.  States he does monitor his blood sugars.  Fasting blood sugar this morning was in the 200's and patient reporting fasting ranges of 80-200's.  Latest Hgb A1C increased to 11.8 on 01/23/2020 (patient unaware).  Spoke with patient and son about insulin, medications, and diet.  Reports compliance with medications and insulin.  Patient stating he avoids sugars, but when reviewing meals states he eats:  Breakfast-oatmeal or egg sandwich or V8 juice, Lunch-banana or tomato sandwich, Dinner- ?whatever he wants or another egg sandwich (per son).  Son also reports patient drinks about 3 cups of whole milk daily and 2 ensures a day.  Receives box of ensure from primary providers office monthly.  RN Health Coach discussed changing ensure to Glucerna and switching from whole milk to skim milk.  Outreached to providers office and spoke with [place] and discussed possibly supplying patient with Glucerna instead of ensure.  Encounter Medications:  Outpatient Encounter Medications as of 01/28/2020  Medication Sig  . Insulin Glargine (BASAGLAR KWIKPEN) 100 UNIT/ML SOPN Inject 0.3 mLs (30 Units total) into the skin at bedtime.  Marland Kitchen albuterol (PROVENTIL) (2.5 MG/3ML) 0.083%  nebulizer solution INHALE 1 VIAL VIA NEBULIZER EVERY 6 HOURS AS NEEDED FOR WHEEZING OR SHORTNESS OF BREATH  . budesonide-formoterol (SYMBICORT) 160-4.5 MCG/ACT inhaler Inhale 2 puffs into the lungs 2 (two) times daily.  . cloNIDine (CATAPRES) 0.1 MG tablet TAKE 1 TABLET BY MOUTH 2 TIMES A DAY  . ELIQUIS 5 MG TABS tablet Take 1 tablet by mouth twice daily  . furosemide (LASIX) 40 MG tablet TAKE 1 TABLET BY MOUTH DAILY  . HYDROcodone-acetaminophen (NORCO) 5-325 MG tablet Take 1 tablet by mouth every 6 (six) hours as needed for up to 10 days for moderate pain.  . Insulin Pen Needle (PEN NEEDLES) 31G X 6 MM MISC Use with insulin qd  . ipratropium-albuterol (DUONEB) 0.5-2.5 (3) MG/3ML SOLN Take 3 mLs by nebulization every 6 (six) hours as needed.  . Lancets (ONETOUCH DELICA PLUS [id number]) MISC USE TO CHECK BLOOD SUGAR TWICE DAILY AS DIRECTED  . LORazepam (ATIVAN) 0.5 MG tablet TAKE 1 TABLET BY MOUTH ONCE DAILY AT BEDTIME AS NEEDED FOR SLEEP  . metFORMIN (GLUCOPHAGE) 500 MG tablet Take 1 tablet (500 mg total) by mouth 2 (two) times daily with a meal.  . metoprolol tartrate (LOPRESSOR) 25 MG tablet TAKE 1 TABLET BY MOUTH TWICE DAILY  . OXYGEN Inhale 3 L into the lungs continuous.   . potassium chloride SA (KLOR-CON) 20 MEQ tablet TAKE 1 TABLET BY MOUTH DAILY WITH LASIX  . pravastatin (PRAVACHOL) 80 MG tablet TAKE 1 TABLET BY MOUTH AT BEDTIME  . valACYclovir (VALTREX) 1000 MG tablet Take 1 tablet (1,000 mg total) by mouth 3 (three) times daily.   No facility-administered encounter medications on file  as of 01/28/2020.    Functional Status:  In your present state of health, do you have any difficulty performing the following activities: 09/15/2019  Hearing? N  Vision? N  Difficulty concentrating or making decisions? N  Walking or climbing stairs? N  Dressing or bathing? N  Doing errands, shopping? N  Preparing Food and eating ? N  Using the Toilet? N  In the past six months, have you accidently  leaked urine? N  Do you have problems with loss of bowel control? Y  Comment stool incontinence at times  Managing your Medications? N  Managing your Finances? N  Housekeeping or managing your Housekeeping? N  Some recent data might be hidden    Fall/Depression Screening: Fall Risk  01/28/2020 09/15/2019 07/30/2019  Falls in the past year? 0 0 0  Comment - - -  Number falls in past yr: 0 - -  Injury with Fall? 0 - -  Risk for fall due to : History of fall(s);Impaired balance/gait;Impaired mobility;Impaired vision;Medication side effect History of fall(s);Impaired balance/gait;Orthopedic patient;Impaired mobility;Impaired vision -  Follow up Falls evaluation completed;Education provided;Falls prevention discussed Falls evaluation completed;Education provided;Falls prevention discussed -   PHQ 2/9 Scores 11/14/2018 10/25/2018 08/13/2018 08/08/2018 05/23/2018 09/12/2017 08/15/2017  PHQ - 2 Score 0 0 0 0 0 0 0  PHQ- 9 Score - - - - - 0 0   THN CM Care Plan Problem One     Most Recent Value  Care Plan Problem One  Knowledge deficiet related to self care management of COPD and diabetes and medical follow up  Role Documenting the Problem One  [place] for Problem One  Active  THN Long Term Goal   Patient will report decreasing Hgb A1C by 1 point in the next 90 days.  THN Long Term Goal Start Date  01/28/20  Interventions for Problem One Long Term Goal  Goals and care plan reviewed and discussed, reviewed medications with patient and son and encouraged medication compliance, confirmed patient know how to administer and is compliant with insulin dose, encouraged to keep and attend medical appointments, diabetic diet reviewed and discussed with patient and son, sending EMMI Diabetic Meal Planning education, discussed drinking Glucerna instead of Ensure,   THN CM Short Term Goal #1   Patient will report no emergency room visits in the next 30 days.  THN CM Short Term Goal #1 Start Date   01/28/20  [facility] CM Short Term Goal #1 Met Date  01/28/20  Interventions for Short Term Goal #1  Know before you go brochure mailed to patient, encouraged patient to contact primary care provider for health concerns and increased need for rescue inhaler, sending COPD Action Plan brochure, attempted to discuss maintainence inhaler versus rescue inhaler, encouraged compliance with wearing oxygen at all times  THN CM Short Term Goal #2   Patient will reschedule missed endocrinology appointment within the next 30 days.  THN CM Short Term Goal #2 Start Date  09/15/19     Appointments:  Attended appointment with primary care provider, Dr. Dennard Schaumann on 01/23/2020.  Plan: RN Health Coach will send primary care provider quarterly update. RN Health Coach will send patient Know Before You Go Brochure. RN Health Coach will send patient Living Well with COPD Educational Packet. RN Health Coach will send patient EMMI Diabetes and Diet. RN Health Coach will send patient EMMI Diabetes Diet. RN Health Coach will make next telephone outreach to patient in the month of May and patient  agrees to future outreach.  [place] [phone] [email or web address]

## 2020-01-29 ENCOUNTER — Other Ambulatory Visit: Payer: Self-pay | Admitting: Family Medicine

## 2020-01-30 ENCOUNTER — Other Ambulatory Visit: Payer: Self-pay | Admitting: Family Medicine

## 2020-01-30 DIAGNOSIS — E1165 Type 2 diabetes mellitus with hyperglycemia: Secondary | ICD-10-CM

## 2020-01-30 DIAGNOSIS — I5032 Chronic diastolic (congestive) heart failure: Secondary | ICD-10-CM

## 2020-01-30 DIAGNOSIS — I480 Paroxysmal atrial fibrillation: Secondary | ICD-10-CM

## 2020-01-30 DIAGNOSIS — J441 Chronic obstructive pulmonary disease with (acute) exacerbation: Secondary | ICD-10-CM

## 2020-01-31 DIAGNOSIS — J449 Chronic obstructive pulmonary disease, unspecified: Secondary | ICD-10-CM | POA: Diagnosis not present

## 2020-02-02 IMAGING — CR DG CHEST 1V PORT
1 series · 1 of 1 positions shown · non-contrast
Comparison: 09/25/2017

CLINICAL DATA: Increasing shortness of breath

EXAM:
PORTABLE CHEST 1 VIEW

[portable]
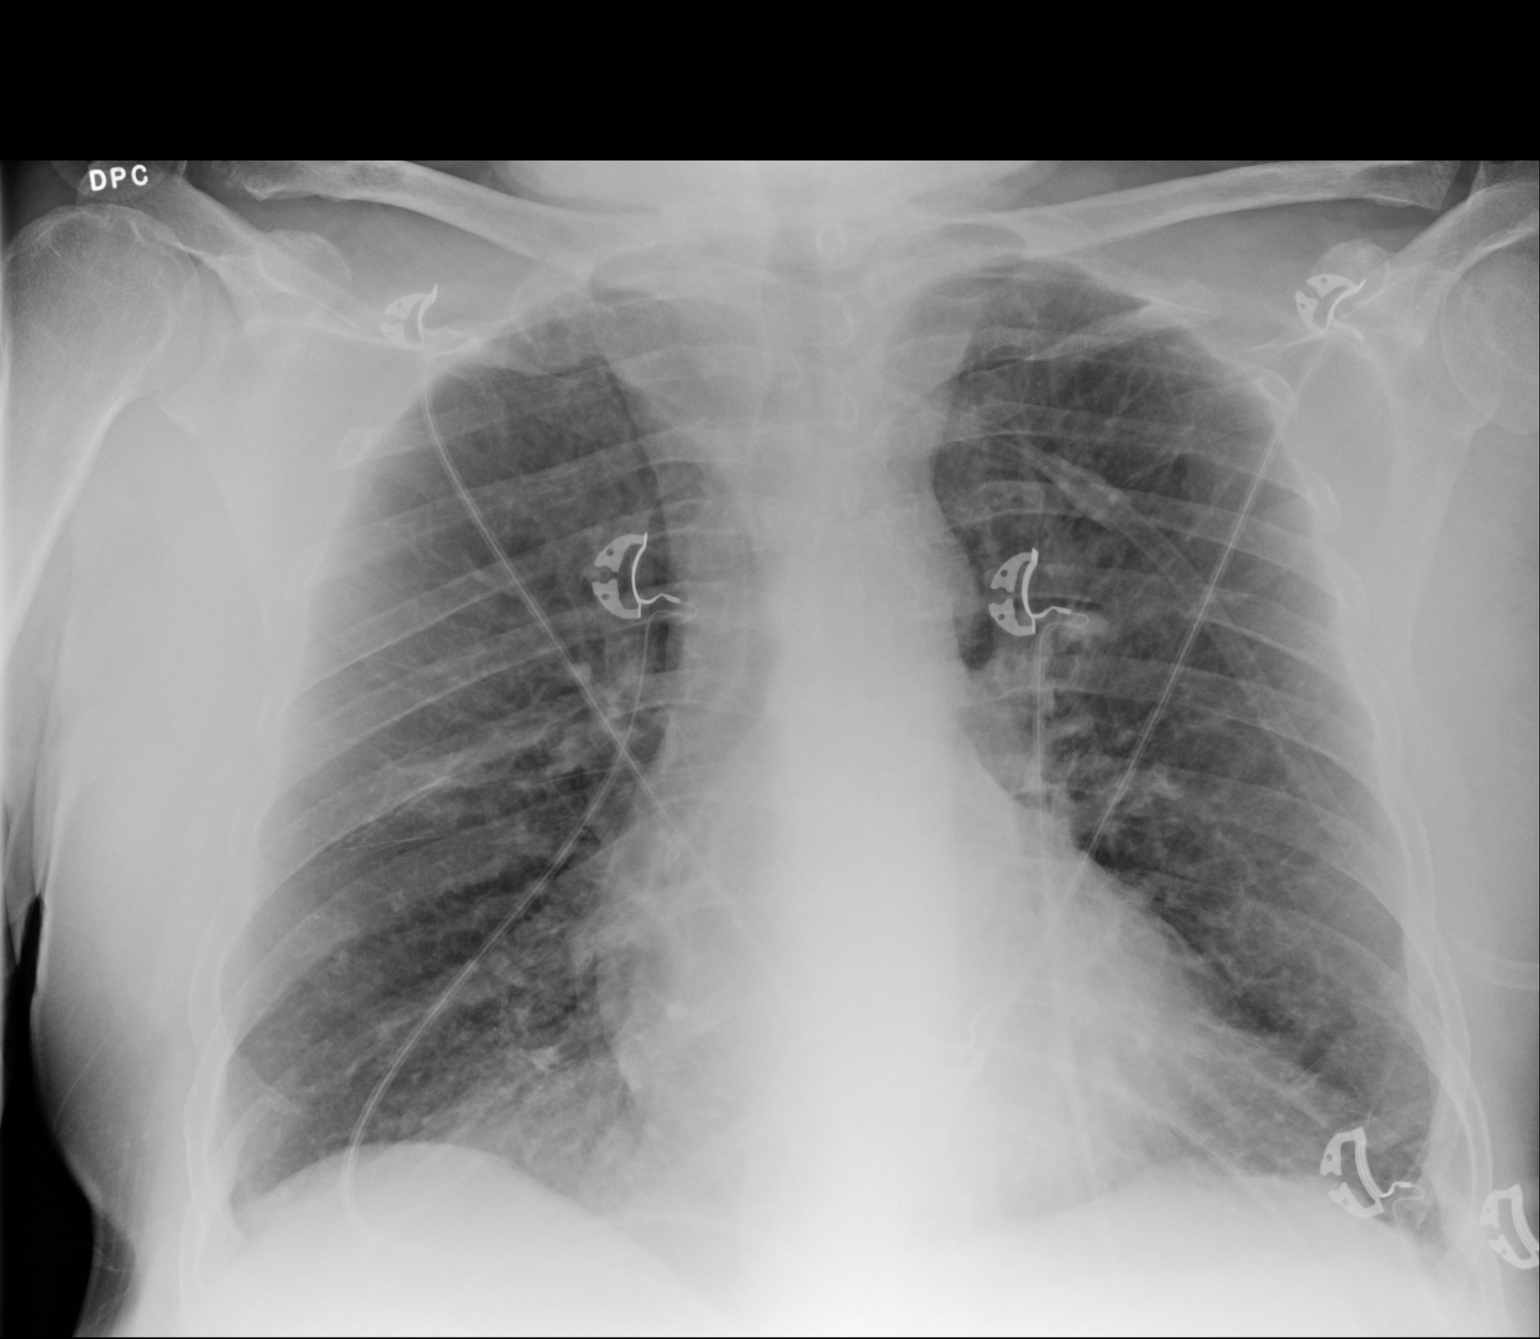

[1 of 1 positions shown; findings below may reference images not displayed]

FINDINGS: Mild hyperinflation. Mild diffuse increased interstitial opacity.
Stable cardiomediastinal silhouette. No pneumothorax. Subsegmental
atelectasis in the right perihilar region.
IMPRESSION: Mild diffuse increased interstitial opacity, suspect acute
interstitial inflammation or minimal edema on chronic interstitial
changes.

## 2020-02-03 ENCOUNTER — Other Ambulatory Visit: Payer: Self-pay | Admitting: Family Medicine

## 2020-02-03 ENCOUNTER — Other Ambulatory Visit: Payer: Self-pay

## 2020-02-03 ENCOUNTER — Ambulatory Visit: Payer: Medicare Other | Admitting: Pharmacist

## 2020-02-03 DIAGNOSIS — E7800 Pure hypercholesterolemia, unspecified: Secondary | ICD-10-CM

## 2020-02-03 DIAGNOSIS — E1159 Type 2 diabetes mellitus with other circulatory complications: Secondary | ICD-10-CM

## 2020-02-03 DIAGNOSIS — J439 Emphysema, unspecified: Secondary | ICD-10-CM

## 2020-02-03 DIAGNOSIS — I10 Essential (primary) hypertension: Secondary | ICD-10-CM

## 2020-02-03 MED ORDER — HYDROCODONE-ACETAMINOPHEN 5-325 MG PO TABS: 1.0000 | ORAL_TABLET | Freq: Four times a day (QID) | ORAL | 0 refills | Status: DC | PRN

## 2020-02-03 NOTE — Telephone Encounter (Signed)
Patient requesting a refill on Hydrocodone last refilled: 01/23/2020. Last office visit: 01/23/2020  [facility] in [place]. Ok to refill?

## 2020-02-03 NOTE — Chronic Care Management (AMB) (Addendum)
Chronic Care Management Pharmacy  Name: Edward [name]  MRN: [id number] DOB: [birth year]  Chief Complaint/ HPI  Edward [name],  85 y.o. , male presents for their Initial CCM visit with the clinical pharmacist via telephone.  PCP : Edward Frizzle, MD  Their chronic conditions include: Type II DM, Hypertension, hyperlipidemia, COPD, CKD stage III.  Office Visits:  01/23/2020 Edward [name]) - started on Lantus 30 units in the morning to avoid early morning hypoglycemia  01/13/2020 (Edward [name]) - started valtrex and refilled Norco for shingles pain  12/29/2019 - give 80mg  of depo-medrol for COPD exacerbation, atrovent nebs q6h and continued z-pak  Consult Visit  01/19/2020 Edward [name], ED) - presented with generalized weakness, no med changes  Medications: Outpatient Encounter Medications as of 02/03/2020  Medication Sig   albuterol (PROVENTIL) (2.5 MG/3ML) 0.083% nebulizer solution INHALE 1 VIAL VIA NEBULIZER EVERY 6 HOURS AS NEEDED FOR WHEEZING OR SHORTNESS OF BREATH   budesonide-formoterol (SYMBICORT) 160-4.5 MCG/ACT inhaler Inhale 2 puffs into the lungs 2 (two) times daily.   cloNIDine (CATAPRES) 0.1 MG tablet TAKE 1 TABLET BY MOUTH 2 TIMES A DAY   ELIQUIS 5 MG TABS tablet Take 1 tablet by mouth twice daily   furosemide (LASIX) 40 MG tablet TAKE 1 TABLET BY MOUTH DAILY   Insulin Glargine (BASAGLAR KWIKPEN) 100 UNIT/ML SOPN Inject 0.3 mLs (30 Units total) into the skin at bedtime.   Insulin Pen Needle (PEN NEEDLES) 31G X 6 MM MISC Use with insulin qd   ipratropium-albuterol (DUONEB) 0.5-2.5 (3) MG/3ML SOLN Take 3 mLs by nebulization every 6 (six) hours as needed.   Lancets (ONETOUCH DELICA PLUS 123XX123) MISC USE TO CHECK BLOOD SUGAR TWICE DAILY AS DIRECTED   LORazepam (ATIVAN) 0.5 MG tablet TAKE 1 TABLET BY MOUTH ONCE DAILY AT BEDTIME AS NEEDED FOR SLEEP   metFORMIN (GLUCOPHAGE) 500 MG tablet Take 1 tablet (500 mg total) by mouth 2 (two) times daily with a meal.   metoprolol tartrate  (LOPRESSOR) 25 MG tablet TAKE 1 TABLET BY MOUTH TWICE DAILY   OXYGEN Inhale 3 L into the lungs continuous.    potassium chloride SA (KLOR-CON) 20 MEQ tablet TAKE 1 TABLET BY MOUTH DAILY WITH LASIX   pravastatin (PRAVACHOL) 80 MG tablet TAKE 1 TABLET BY MOUTH AT BEDTIME   valACYclovir (VALTREX) 1000 MG tablet Take 1 tablet (1,000 mg total) by mouth 3 (three) times daily. (Patient not taking: Reported on 02/03/2020)   No facility-administered encounter medications on file as of 02/03/2020.     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    Difficulty of Paying Living Expenses: Not very hard     Goals Addressed             This Visit's Progress    COPD       High Blood Pressure - < 130/80       High Cholesterol - LDL < 100       Type II Diabetes - A1c < 7%          COPD     Eosinophil count:   Lab Results  Component Value Date/Time   EOSPCT 5.6 01/23/2020 02:36 PM  %                               Eos (Absolute):  Lab Results  Component Value Date/Time   EOSABS 482 01/23/2020 02:36 PM    Tobacco Status:  Social History   Tobacco Use  Smoking Status Former Smoker   Packs/day: 1.50   Years: 60.00   Pack years: 90.00   Types: Cigarettes   Quit date: 12/31/2012   Years since quitting: 7.0  Smokeless Tobacco Never Used    Patient has failed these meds in past: Trelegy ellipta, [place] Patient is currently controlled on the following medications: Symbicort, Duoneb every 6 hours prn Using maintenance inhaler regularly? No Frequency of rescue inhaler use: patient reports limited used of albuterol nebs  We discussed: patient mentioned breathing well controlled lately on current medications.  No concerns on breathing at the moment.  Plan  Continue current medications report any sudden change in symptoms to provider. ,  Diabetes   Recent Relevant Labs: Lab Results  Component Value Date/Time   HGBA1C 11.8 (H) 01/23/2020 02:36 PM   HGBA1C  10.5 (H) 10/13/2019 10:35 AM   HGBA1C >14.0 (H) 12/01/2015 08:31 AM   HGBA1C >14.0 (H) 05/27/2015 08:54 AM   MICROALBUR 378.4 01/23/2020 03:09 PM   MICROALBUR 144.9 02/10/2019 09:31 AM     Checking BG: Daily.  Checking in the morning before he takes insulin about 4:00 am and then sometimes before he eats dinner.  He brought in fasting blood glucose log today.  Recent FBG Readings: 249  153  142  82  130  220  300  92  111  141  134  137  [phone] AVERAGE   Recent pre-meal BG readings: none Recent 2hr PP BG readings:  none Recent HS BG readings: none Patient has failed these meds in past: novolog 70/30, glimpepiride, Actos, lantus Patient is currently uncontrolled on the following medications: Basaglar 30 units in the morning,  metformin 500mg  twice daily  Last diabetic EYE exam:  Lab Results  Component Value Date/Time   HMDIABEYEEXA Hunt 05/14/2012 12:00 AM    We discussed: diet extensively.  Patient was provided education on carbohydrates and their effect on blood sugar.  He reported that his "lows" were much less frequent with the recent change to taking insulin in the AM.  We discussed his elevated A1c and risks of sustained hyperglycemia on micro/macrovascular complications.    Plan  Patient to work on limiting servings of carbohydrates to one per meal (30-45 g) and limit consumption of bread to once daily.  Will consult with PCP as far as additional medications to control glucose.  Recommend titration of metformin or additional med such as Jardiance d/t HF.    Hypertension    Office blood pressures are  BP Readings from Last 3 Encounters:  01/23/20 (!) 150/86  01/19/20 115/65  01/13/20 130/60    Patient has failed these meds in the past: diltiazem 240mg , quinapril 20mg   Patient checks BP at home  never  Patient home BP readings are ranging: patient does not have access to monitor to check BP  Patient is currently controlled on the following  medications: clonidine 0.1mg  tabs twice daily, furosemide 40mg , metoprolol 25mg  twice daily   We discussed checking at [facility] or other grocery chains whenever out just to keep close eye of BP and report elevations to provider or PCP.  Plan  Continue current medications and let provider know if there is readings > 130/80.   Hypercholesterolemia   Lipid Panel     Component Value Date/Time   CHOL 203 (H) 10/13/2019 1035   TRIG 490 (H) 10/13/2019 1035   HDL 36 (L) 10/13/2019 1035   CHOLHDL 5.6 (H) 10/13/2019 1035  VLDL 25 09/26/2017 0655   LDLCALC  10/13/2019 1035     Comment:     . LDL cholesterol not calculated. Triglyceride levels greater than 400 mg/dL invalidate calculated LDL results. . Reference range: <100 . Desirable range <100 mg/dL for primary prevention;   <70 mg/dL for patients with CHD or diabetic patients  with > or = 2 CHD risk factors. Marland Kitchen LDL-C is now calculated using the Martin-Hopkins  calculation, which is a validated novel method providing  better accuracy than the Friedewald equation in the  estimation of LDL-C.  Cresenciano Genre et al. Annamaria Helling. [id number]): 2061-2068  (http://education.[facility].com/faq/FAQ164)      Patient has failed these meds in past: simvastatin 20mg  daily Patient is currently uncontrolled on the following medications: pravastatin 80mg  at bedtime  We discussed:  Diet extensively.  Patient wishes to control with diet right now.  Counseled on risks of elevated cholesterol and the meaning of triglycerides.  Plan  Continue current medications.  Patient has set goal to work on diet over the next month until we meet again.  Vaccines   Reviewed and discussed patient's vaccination history.    Immunization History  Administered Date(s) Administered   Pneumococcal Conjugate-13 10/23/2013   Pneumococcal Polysaccharide-23 03/02/2002   Td 06/02/2004    Plan  Recommended patient receive SHINGRIX vaccine in pharmacy.     Medication Management   Miscellaneous medications: Lorazepam 0.5mg  tablets, potassium 66meq tablets OTC's: none Patient currently uses Product/process development scientist in [place].  Phone #  [phone] Patient reports using no methods to promote adherence, however, patient denies missed doses of medication.  Discussed [employer] packaging and patient will consider.   Beverly Milch, PharmD Clinical Pharmacist [place] [phone] I have collaborated with the care management provider regarding care management and care coordination activities outlined in this encounter and have reviewed this encounter including documentation in the note and care plan. I am certifying that I agree with the content of this note and encounter as supervising physician.

## 2020-02-03 NOTE — Patient Instructions (Addendum)
Thank you for meeting with me today!  I look forward to working with you to help you meet all of your healthcare goals and answer any questions you may have.  Feel free to contact me anytime!   Visit Information  Goals Addressed            This Visit's Progress   . COPD       CARE PLAN ENTRY  Current Barriers:  . Chronic Disease Management support, education, and care coordination needs related to COPD  Pharmacist Clinical Goal(s):  Marland Kitchen Over the next 30 days patient will work with PhamD to optimize medication related to COPD.  Interventions: . Comprehensive medication review performed.  Patient Self Care Activities:  . Over the next 30 days patient is to report any increase in symptoms to PharmD or PCP.  Initial goal documentation     . High Blood Pressure - < 130/80       CARE PLAN ENTRY (see longitudinal plan of care for additional care plan information)  Current Barriers:  . Uncontrolled hypertension, complicated by hyperlipidemia, Type II diabetes, and COPD. . Current antihypertensive regimen: clonidine 0.1mg  twice daily, furosemide 40mg  daily, metoprolol 25mg  twice daily . Previous antihypertensives tried: diltiazem 240mg  ER, quinapril 20mg  . Last practice recorded BP readings:  BP Readings from Last 3 Encounters:  01/23/20 (!) 150/86  01/19/20 115/65  01/13/20 130/60     Pharmacist Clinical Goal(s):  Marland Kitchen Over the next 30 days, patient will work with PharmD and providers to optimize antihypertensive regimen  Interventions: . Inter-disciplinary care team collaboration (see longitudinal plan of care) . Comprehensive medication review performed; medication list updated in the electronic medical record.  . Encouraged patient to go check BP at grocery store or pharmacy whenever is convenient since he does not have monitor at home.  Patient Self Care Activities:  . Patient will continue to check BP as he is able , document, and provide at future  appointments . Patient will focus on medication adherence by pill count. . Over the next 30 days if patient check BP and is > 130/80 patient is to contact PCP or PharmD.  Initial goal documentation     . High Cholesterol - LDL < 100       CARE PLAN ENTRY (see longitudinal plan of care for additional care plan information)  Current Barriers:  . Uncontrolled hyperlipidemia, complicated by Type II DM, Hypertension, and COPD. . Current antihyperlipidemic regimen: pravastatin 80mg  daily . Previous antihyperlipidemic medications tried simvastatin  . Most recent lipid panel:     Component Value Date/Time   CHOL 203 (H) 10/13/2019 1035   TRIG 490 (H) 10/13/2019 1035   HDL 36 (L) 10/13/2019 1035   CHOLHDL 5.6 (H) 10/13/2019 1035   VLDL 25 09/26/2017 0655   LDLCALC  10/13/2019 1035     Comment:     . LDL cholesterol not calculated. Triglyceride levels greater than 400 mg/dL invalidate calculated LDL results. . Reference range: <100 . Desirable range <100 mg/dL for primary prevention;   <70 mg/dL for patients with CHD or diabetic patients  with > or = 2 CHD risk factors. Marland Kitchen LDL-C is now calculated using the Martin-Hopkins  calculation, which is a validated novel method providing  better accuracy than the Friedewald equation in the  estimation of LDL-C.  Cresenciano Genre et al. Annamaria Helling. [id number]): 2061-2068  (http://education.[facility].com/faq/FAQ164)     Pharmacist Clinical Goal(s):  Marland Kitchen Over the next 30 days, patient will work with PharmD and providers  towards optimized antihyperlipidemic therapy  Interventions: . Comprehensive medication review performed; medication list updated in electronic medical record.  Bertram Savin care team collaboration (see longitudinal plan of care) . Patient will work on diet and carbohydrate counting, limiting portion sizes to 30-45 grams of carbs per meal.   Patient Self Care Activities:  . Patient will focus on medication adherence by  pill count. . Over the next 30 days patient will attempt to make small changes in diet including limiting carbohydrates to one serving of breads per day.  Initial goal documentation     . Type II Diabetes - A1c < 7%         Mr. Ruhmann was given information about Chronic Care Management services today including:  1. CCM service includes personalized support from designated clinical staff supervised by his physician, including individualized plan of care and coordination with other care providers 2. 24/7 contact phone numbers for assistance for urgent and routine care needs. 3. Standard insurance, coinsurance, copays and deductibles apply for chronic care management only during months in which we provide at least 20 minutes of these services. Most insurances cover these services at 100%, however patients may be responsible for any copay, coinsurance and/or deductible if applicable. This service may help you avoid the need for more expensive face-to-face services. 4. Only one practitioner may furnish and bill the service in a calendar month. 5. The patient may stop CCM services at any time (effective at the end of the month) by phone call to the office staff.  Patient agreed to services and verbal consent obtained.   The patient verbalized understanding of instructions provided today and agreed to receive a mailed copy of patient instruction and/or educational materials. Telephone follow up appointment with pharmacy team member scheduled for: 03/11/2020 @ 8:30 AM  Beverly Milch, PharmD Clinical Pharmacist [place] [phone]

## 2020-02-05 ENCOUNTER — Other Ambulatory Visit: Payer: Self-pay | Admitting: Pharmacy Technician

## 2020-02-05 NOTE — Patient Outreach (Signed)
[facility]) Care Management  02/05/2020  Jabari KYNDALL MISKIEWICZ 1935/09/18 [id number]   Care coordination call placed to [place] in regards to patient's Symbicort application.  Spoke to [place] who informed patient was APPROVED 02/02/2020-10/01/2020. She  Informed a shipment was in progress and should arrive to patient's home in the next 7-10 business days.  Will follow up with patient in 7-10 business days to confirm receipt of medication and to discuss refill procedure.  Jaelynn Currier P. Jearldean Gutt, [place]  [phone]

## 2020-02-06 ENCOUNTER — Other Ambulatory Visit: Payer: Self-pay | Admitting: Family Medicine

## 2020-02-06 ENCOUNTER — Ambulatory Visit: Payer: Self-pay | Admitting: Pharmacist

## 2020-02-06 MED ORDER — METFORMIN HCL 1000 MG PO TABS: 1000.0000 mg | ORAL_TABLET | Freq: Two times a day (BID) | ORAL | 11 refills | Status: DC

## 2020-02-06 NOTE — Chronic Care Management (AMB) (Addendum)
Chronic Care Management   Follow Up Note   02/06/2020 Name: Edward [name] MRN: [id number] DOB: Nov 20, 1934  Referred by: Edward Frizzle, MD   Edward [name] is a 85 y.o. year old male who is a primary care patient of Edward [name], Edward Mcgee, MD. The CCM team was consulted for assistance with chronic disease management and care coordination needs.    Review of patient status, including review of consultants reports, relevant laboratory and other test results, and collaboration with appropriate care team members and the patient's provider was performed as part of comprehensive patient evaluation and provision of chronic care management services.        Outpatient Encounter Medications as of 02/06/2020  Medication Sig   albuterol (PROVENTIL) (2.5 MG/3ML) 0.083% nebulizer solution INHALE 1 VIAL VIA NEBULIZER EVERY 6 HOURS AS NEEDED FOR WHEEZING OR SHORTNESS OF BREATH   budesonide-formoterol (SYMBICORT) 160-4.5 MCG/ACT inhaler Inhale 2 puffs into the lungs 2 (two) times daily.   cloNIDine (CATAPRES) 0.1 MG tablet TAKE 1 TABLET BY MOUTH 2 TIMES A DAY   ELIQUIS 5 MG TABS tablet Take 1 tablet by mouth twice daily   furosemide (LASIX) 40 MG tablet TAKE 1 TABLET BY MOUTH DAILY   HYDROcodone-acetaminophen (NORCO/VICODIN) 5-325 MG tablet Take 1 tablet by mouth every 6 (six) hours as needed for moderate pain.   Insulin Glargine (BASAGLAR KWIKPEN) 100 UNIT/ML SOPN Inject 0.3 mLs (30 Units total) into the skin at bedtime.   Insulin Pen Needle (PEN NEEDLES) 31G X 6 MM MISC Use with insulin qd   ipratropium-albuterol (DUONEB) 0.5-2.5 (3) MG/3ML SOLN Take 3 mLs by nebulization every 6 (six) hours as needed.   Lancets (ONETOUCH DELICA PLUS 123XX123) MISC USE TO CHECK BLOOD SUGAR TWICE DAILY AS DIRECTED   LORazepam (ATIVAN) 0.5 MG tablet TAKE 1 TABLET BY MOUTH ONCE DAILY AT BEDTIME AS NEEDED FOR SLEEP   metFORMIN (GLUCOPHAGE) 1000 MG tablet Take 1 tablet (1,000 mg total) by mouth 2 (two) times daily with a  meal.   metoprolol tartrate (LOPRESSOR) 25 MG tablet TAKE 1 TABLET BY MOUTH TWICE DAILY   OXYGEN Inhale 3 L into the lungs continuous.    potassium chloride SA (KLOR-CON) 20 MEQ tablet TAKE 1 TABLET BY MOUTH DAILY WITH LASIX   pravastatin (PRAVACHOL) 80 MG tablet TAKE 1 TABLET BY MOUTH AT BEDTIME   valACYclovir (VALTREX) 1000 MG tablet Take 1 tablet (1,000 mg total) by mouth 3 (three) times daily. (Patient not taking: Reported on 02/03/2020)   No facility-administered encounter medications on file as of 02/06/2020.     Objective:   Patient has A1c of 11.8 and is in need of additional medication to get to goal of < 7%.  After consulting with Dr. Dennard [name] we have agreed to start Jardiance 10mg  daily, however patient will not be able to afford so patient assistance is needed.  [employer] patient assistance forms filled out, awaiting MD signature to fax.  Plan:   Telephone follow up appointment with care management team member scheduled for:  03/11/2020 will update patient on status of this application sooner if available.  Will need to re-evaluate A1c 3 months after the start of Jardiance if approved.   Edward [name], PharmD Clinical Pharmacist [place] [facility] [phone]   I have collaborated with the care management provider regarding care management and care coordination activities outlined in this encounter and have reviewed this encounter including documentation in the note and care plan. I am certifying that I  agree with the content of this note and encounter as supervising physician.

## 2020-02-10 ENCOUNTER — Other Ambulatory Visit: Payer: Self-pay

## 2020-02-10 ENCOUNTER — Encounter: Payer: Self-pay | Admitting: Family Medicine

## 2020-02-10 ENCOUNTER — Ambulatory Visit (INDEPENDENT_AMBULATORY_CARE_PROVIDER_SITE_OTHER): Payer: Medicare Other | Admitting: Family Medicine

## 2020-02-10 VITALS — BP 120/80 | HR 98 | Temp 96.6°F | Resp 20 | Ht 65.0 in | Wt 165.0 lb

## 2020-02-10 DIAGNOSIS — B0229 Other postherpetic nervous system involvement: Secondary | ICD-10-CM

## 2020-02-10 MED ORDER — GABAPENTIN 100 MG PO CAPS: 100.0000 mg | ORAL_CAPSULE | Freq: Three times a day (TID) | ORAL | 3 refills | Status: DC

## 2020-02-10 NOTE — Progress Notes (Signed)
Subjective:    Patient ID: Edward [name], male    DOB: 10/01/1935, 85 y.o.   MRN: [id number]  HPI  01/13/20 Patient was seen in the emergency room on April 10 for severe pain in his right arm.  At that time there was no rash.  Presumptive diagnosis was cervical radiculopathy.  CT scanning was obtained of the cervical spine for further evaluation.  Patient had mild foraminal narrowing on the right and moderate foraminal narrowing on the left but no obvious nerve impingement.  Started on hydrocodone.  Over the last day he has developed a rash in his right upper extremity.  Please see the photograph below.  Patient reports burning stinging fire-like pain radiating up and down his right arm from his neck to his fingertips.  The pain is severe.  He is about out of hydrocodone that was given to him in the emergency room.  Rash is characterized by numerous erythematous papules coalescing into plaques on the length of the arm from his shoulder to his hand.  There are numerous erythematous nodules.  There are no visible vesicles.  At that time, my plan was: Begin Valtrex 1 g p.o. 3 times daily for 7 days.  Refill hydrocodone 5/325 1 p.o. every 6 hours number 40 tablets.  Reassess in 1 week.  If pain worsens, consider adding gabapentin.  01/23/20 Patient states that he is hurting in his neck down his right shoulder into his right arm all the way to his right fingertips.  He is questioning why this is happening.  The rash that I saw him last office visit has turned into scabbed papules consistent with resolving shingles.  The vesicles are beginning to scab over and dissipate.  However the patient states the pain is no better.  He also recently went to the emergency room with weakness and was found to have a blood sugar in the 50s.  He takes 30 units of Basaglar at night prior to bed.  Sometimes he forgets to take a snack.  His last hemoglobin A1c was near 11 suggesting that his average blood sugar is close to 300.   However he states that his fasting blood sugars in the morning are quite often in the 40s and 50s!.  I question the veracity of this however I do think that this is a patient that has a high risk of hypoglycemia and therefore taking his insulin at night is a poor choice.  At that time, my plan was: Diabetes sounds poorly controlled.  I have recommended that the patient begin Lantus 30 units in the morning to avoid early a.m. hypoglycemia.  I have asked him to check his blood sugars fasting and 2 hours postprandial and notify me in 1 week with how sugars are running since he switches his insulin to a different time of day.  I will also check his hemoglobin A1c.  Patient is a poor candidate for mealtime insulin.  However he may benefit from an insulin sensitizer such as Actos.  Shingles is slowly resolving.  I did refill his pain medication.  02/10/20   Continues to have severe pain radiating down the right arm in the location where he previously had his shingles outbreak.  Examination of the skin shows slowly resolving erythematous papules in a dermatomal distribution down the right arm.  The pain radiates from his posterior right shoulder all the way into his fingertips.  The pain is intense burning and stinging in nature.  He is already  run out of his hydrocodone.  Past Medical History:  Diagnosis Date  . Allergy    Rhinitis  . Atrial fibrillation ([place])   . Bronchitis   . Chronic respiratory failure ([place])   . Colon polyps   . COPD (chronic obstructive pulmonary disease) ([place])   . Diabetes mellitus   . Elevated lipids   . Hypercholesterolemia   . Hypertension   . Iron deficiency anemia due to chronic blood loss 04/30/2018  . Noncompliance   . On home O2    2L N/C   . PSA elevation   . Pulmonary fibrosis ([place])   . Vitamin D deficiency    Past Surgical History:  Procedure Laterality Date  . BIOPSY  04/23/2018   Procedure: BIOPSY;  Surgeon: Danie Binder, MD;  [facility];   Service: Endoscopy;;  duodenum gastric  . CATARACT EXTRACTION W/PHACO  06/25/2012   Procedure: CATARACT EXTRACTION PHACO AND INTRAOCULAR LENS PLACEMENT (IOC);  Surgeon: Elta Guadeloupe T. Gershon Crane, MD;  Location: [facility];  Service: Ophthalmology;  Laterality: Left;  CDE=19.01  . CATARACT EXTRACTION W/PHACO  07/09/2012   Procedure: CATARACT EXTRACTION PHACO AND INTRAOCULAR LENS PLACEMENT (IOC);  Surgeon: Elta Guadeloupe T. Gershon Crane, MD;  Location: [facility];  Service: Ophthalmology;  Laterality: Right;  CDE: 20.09  . COLONOSCOPY WITH PROPOFOL N/A 04/23/2018   Procedure: COLONOSCOPY WITH PROPOFOL;  Surgeon: Danie Binder, MD;  [facility];  Service: Endoscopy;  Laterality: N/A;  . ESOPHAGOGASTRODUODENOSCOPY (EGD) WITH PROPOFOL N/A 04/23/2018   Procedure: ESOPHAGOGASTRODUODENOSCOPY (EGD) WITH PROPOFOL;  Surgeon: Danie Binder, MD;  [facility];  Service: Endoscopy;  Laterality: N/A;   Current Outpatient Medications on File Prior to Visit  Medication Sig Dispense Refill  . albuterol (PROVENTIL) (2.5 MG/3ML) 0.083% nebulizer solution INHALE 1 VIAL VIA NEBULIZER EVERY 6 HOURS AS NEEDED FOR WHEEZING OR SHORTNESS OF BREATH 360 mL 4  . budesonide-formoterol (SYMBICORT) 160-4.5 MCG/ACT inhaler Inhale 2 puffs into the lungs 2 (two) times daily. 1 Inhaler 3  . cloNIDine (CATAPRES) 0.1 MG tablet TAKE 1 TABLET BY MOUTH 2 TIMES A DAY 60 tablet 3  . ELIQUIS 5 MG TABS tablet Take 1 tablet by mouth twice daily 60 tablet 3  . furosemide (LASIX) 40 MG tablet TAKE 1 TABLET BY MOUTH DAILY 30 tablet 3  . HYDROcodone-acetaminophen (NORCO/VICODIN) 5-325 MG tablet Take 1 tablet by mouth every 6 (six) hours as needed for moderate pain. 30 tablet 0  . Insulin Glargine (BASAGLAR KWIKPEN) 100 UNIT/ML SOPN Inject 0.3 mLs (30 Units total) into the skin at bedtime. 10 pen 3  . Insulin Pen Needle (PEN NEEDLES) 31G X 6 MM MISC Use with insulin qd 100 each 2  . ipratropium-albuterol (DUONEB) 0.5-2.5 (3) MG/3ML SOLN Take 3 mLs by  nebulization every 6 (six) hours as needed. 360 mL 1  . Lancets (ONETOUCH DELICA PLUS 123XX123) MISC USE TO CHECK BLOOD SUGAR TWICE DAILY AS DIRECTED 100 each 2  . LORazepam (ATIVAN) 0.5 MG tablet TAKE 1 TABLET BY MOUTH ONCE DAILY AT BEDTIME AS NEEDED FOR SLEEP 30 tablet 1  . metFORMIN (GLUCOPHAGE) 1000 MG tablet Take 1 tablet (1,000 mg total) by mouth 2 (two) times daily with a meal. 60 tablet 11  . metoprolol tartrate (LOPRESSOR) 25 MG tablet TAKE 1 TABLET BY MOUTH TWICE DAILY 60 tablet 3  . OXYGEN Inhale 3 L into the lungs continuous.     . potassium chloride SA (KLOR-CON) 20 MEQ tablet TAKE 1 TABLET BY MOUTH DAILY  WITH LASIX 30 tablet 2  . pravastatin (PRAVACHOL) 80 MG tablet TAKE 1 TABLET BY MOUTH AT BEDTIME 30 tablet 3   No current facility-administered medications on file prior to visit.   Allergies  Allergen Reactions  . Ace Inhibitors Other (See Comments)    Hyperkalemia--07/23/2013:patient states not familiar with the following allergy   Social History   Socioeconomic History  . Marital status: Married    Spouse name: Not on file  . Number of children: Not on file  . Years of education: Not on file  . Highest education level: Not on file  Occupational History  . 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 Smoking status: Former Smoker    Packs/day: 1.50    Years: 60.00    Pack years: 90.00    Types: Cigarettes    Quit date: 12/31/2012    Years since quitting: 7.1  . Smokeless tobacco: Never Used  Substance and Sexual Activity  . Alcohol use: No  . Drug use: No  . Sexual activity: Yes    Birth control/protection: None  Other Topics Concern  . Not on file  Social History Narrative  . Not on file   Social Determinants of Health   Financial Resource Strain: Low Risk   . Difficulty of Paying Living Expenses: Not very hard  Food Insecurity:   . Worried About Charity fundraiser in the Last Year:   . Arboriculturist in the Last Year:     Transportation Needs: No Transportation Needs  . Lack of Transportation (Medical): No  . Lack of Transportation (Non-Medical): No  Physical Activity: Inactive  . Days of Exercise per Week: 0 days  . Minutes of Exercise per Session: 0 min  Stress: No Stress Concern Present  . Feeling of Stress : Only a little  Social Connections:   . Frequency of Communication with Friends and Family:   . Frequency of Social Gatherings with Friends and Family:   . Attends Religious Services:   . Active Member of Clubs or Organizations:   . Attends Archivist Meetings:   Marland Kitchen Marital Status:   Intimate Partner Violence:   . Fear of Current or Ex-Partner:   . Emotionally Abused:   Marland Kitchen Physically Abused:   . Sexually Abused:      Review of Systems  All other systems reviewed and are negative.      Objective:   Physical Exam Vitals reviewed.  Constitutional:      General: He is not in acute distress.    Appearance: Normal appearance. He is obese. He is not ill-appearing, toxic-appearing or diaphoretic.  HENT:     Nose: Nose normal. No congestion or rhinorrhea.  Cardiovascular:     Rate and Rhythm: Normal rate and regular rhythm.     Heart sounds: Normal heart sounds. No murmur. No gallop.   Pulmonary:     Effort: Pulmonary effort is normal. Prolonged expiration present. No tachypnea, accessory muscle usage, respiratory distress or retractions.     Breath sounds: Decreased air movement present. No stridor. Examination of the right-upper field reveals decreased breath sounds and wheezing. Examination of the left-upper field reveals decreased breath sounds and wheezing. Examination of the right-lower field reveals decreased breath sounds and wheezing. Examination of the left-lower field reveals decreased breath sounds and wheezing. Decreased breath sounds and wheezing present. No rhonchi or rales.  Musculoskeletal:     Right lower leg: No edema.  Left lower leg: No edema.  Skin:     Findings: Rash present. Rash is macular and papular.       Neurological:     Mental Status: He is alert.           Assessment & Plan:  Postherpetic neuralgia  Try gabapentin 100 mg p.o. 3 times daily.  Recheck later this week and uptitrate to 300 mg 3 times daily if the patient is tolerating the medication and if necessary for pain control.  Monitor for sedation and dizziness.

## 2020-02-13 ENCOUNTER — Other Ambulatory Visit: Payer: Self-pay

## 2020-02-13 ENCOUNTER — Telehealth: Payer: Self-pay

## 2020-02-13 MED ORDER — GABAPENTIN 300 MG PO CAPS: 300.0000 mg | ORAL_CAPSULE | Freq: Three times a day (TID) | ORAL | 3 refills | Status: DC

## 2020-02-13 NOTE — Telephone Encounter (Signed)
Pt's son [name] called to report that the gabapentin that he was prescribed on 05/11 is not working. Please advise.

## 2020-02-13 NOTE — Telephone Encounter (Signed)
Increase gabapentin to 300mg po tid

## 2020-02-13 NOTE — Telephone Encounter (Signed)
New Rx sent to pharmacy

## 2020-02-18 ENCOUNTER — Other Ambulatory Visit: Payer: Self-pay | Admitting: Pharmacy Technician

## 2020-02-18 NOTE — Patient Outreach (Signed)
[facility]) Care Management  02/18/2020  Edward [name] [birth year] [id number]   Successful call placed to patient's son Edward [name] regarding patient assistance medication delivery of Symbicort with AZ[employer], HIPAA identifiers verified.   Patient's son confirmed that his father received 3 Symbicort inhalers. Discussed refill procedure with him which will require him or his father to phone AZ[employer]ME for the refills. Edward [name] was able to locate the number on the pharmacy label on the inhaler box. Edward [name] was also informed  Again that his father should not use this in combo with the [facility] that patient was previously approved for with [employer]. Since [employer] is no longer supporting the [facility] the provider was amenable to switching to the Symbicort. Patient's son verbalized understanding. The LIS letter still was unable to be located so BI will not be sending anymore Spiriva until the determination letter is received.  Edward [name] message sent to embedded [facility] pharmacist Edward [name] updating him on the patient assistance approvals the patient has received in 2021  prior to them embedding.  Will remove myself from care team as the care has been transitioned over to [place]. Edward [name], [place]  [phone]

## 2020-02-18 NOTE — Patient Outreach (Signed)
[facility]) Care Management  02/18/2020  Wylee ANANTH MAISONET March 14, 1935 [id number]  Care coordination call placed to [facility] in regards to AZ[employer]ME application for Symbicort.  Shari informed medication was delivered to the patient's mailbox on 02/10/2020 at 1:12pm.  Anaiz Qazi P. Ivey Nembhard, [place]  [phone]

## 2020-02-19 ENCOUNTER — Encounter (HOSPITAL_COMMUNITY): Payer: Self-pay | Admitting: *Deleted

## 2020-02-19 ENCOUNTER — Other Ambulatory Visit: Payer: Self-pay

## 2020-02-19 ENCOUNTER — Other Ambulatory Visit: Payer: Self-pay | Admitting: Family Medicine

## 2020-02-19 ENCOUNTER — Emergency Department (HOSPITAL_COMMUNITY)
Admission: EM | Admit: 2020-02-19 | Discharge: 2020-02-19 | Disposition: A | Discharge status: Home / Self Care | Payer: Medicare Other | Attending: Emergency Medicine | Admitting: Emergency Medicine

## 2020-02-19 DIAGNOSIS — M792 Neuralgia and neuritis, unspecified: Secondary | ICD-10-CM | POA: Diagnosis not present

## 2020-02-19 DIAGNOSIS — Z794 Long term (current) use of insulin: Secondary | ICD-10-CM | POA: Diagnosis not present

## 2020-02-19 DIAGNOSIS — E1122 Type 2 diabetes mellitus with diabetic chronic kidney disease: Secondary | ICD-10-CM | POA: Diagnosis not present

## 2020-02-19 DIAGNOSIS — J449 Chronic obstructive pulmonary disease, unspecified: Secondary | ICD-10-CM | POA: Diagnosis not present

## 2020-02-19 DIAGNOSIS — Z79899 Other long term (current) drug therapy: Secondary | ICD-10-CM | POA: Diagnosis not present

## 2020-02-19 DIAGNOSIS — I130 Hypertensive heart and chronic kidney disease with heart failure and stage 1 through stage 4 chronic kidney disease, or unspecified chronic kidney disease: Secondary | ICD-10-CM | POA: Diagnosis not present

## 2020-02-19 DIAGNOSIS — I5032 Chronic diastolic (congestive) heart failure: Secondary | ICD-10-CM | POA: Diagnosis not present

## 2020-02-19 DIAGNOSIS — N183 Chronic kidney disease, stage 3 unspecified: Secondary | ICD-10-CM | POA: Insufficient documentation

## 2020-02-19 DIAGNOSIS — Z7901 Long term (current) use of anticoagulants: Secondary | ICD-10-CM | POA: Insufficient documentation

## 2020-02-19 DIAGNOSIS — Z87891 Personal history of nicotine dependence: Secondary | ICD-10-CM | POA: Insufficient documentation

## 2020-02-19 DIAGNOSIS — M79601 Pain in right arm: Secondary | ICD-10-CM | POA: Diagnosis present

## 2020-02-19 MED ORDER — HYDROCODONE-ACETAMINOPHEN 5-325 MG PO TABS
2.0000 | ORAL_TABLET | Freq: Once | ORAL | Status: AC
  Administered 2020-02-19: 2 via ORAL
  Filled 2020-02-19: qty 2

## 2020-02-19 MED ORDER — HYDROCODONE-ACETAMINOPHEN 5-325 MG PO TABS: 1.0000 | ORAL_TABLET | Freq: Four times a day (QID) | ORAL | 0 refills | Status: DC | PRN

## 2020-02-19 NOTE — ED Provider Notes (Signed)
[facility] Provider Note   CSN: [id number] Arrival date & time: 02/19/20  1551     History Chief Complaint  Patient presents with  . Arm Pain    Edward [name] is a 85 y.o. male.  The history is provided by the patient. No language interpreter was used.  Arm Pain This is a chronic problem. Episode onset: 4 weeks. The problem occurs constantly. The problem has been gradually worsening. Nothing aggravates the symptoms. Nothing relieves the symptoms. He has tried acetaminophen for the symptoms. The treatment provided no relief.  Pt had shingles to right arm.  Pt complains of pain, requesting pain medication.  Pt is on gabapentin, was given hydrocodone previously      Past Medical History:  Diagnosis Date  . Allergy    Rhinitis  . Atrial fibrillation ([place])   . Bronchitis   . Chronic respiratory failure ([place])   . Colon polyps   . COPD (chronic obstructive pulmonary disease) ([place])   . Diabetes mellitus   . Elevated lipids   . Hypercholesterolemia   . Hypertension   . Iron deficiency anemia due to chronic blood loss 04/30/2018  . Noncompliance   . On home O2    2L N/C   . PSA elevation   . Pulmonary fibrosis ([place])   . Vitamin D deficiency     Patient Active Problem List   Diagnosis Date Noted  . Mixed hyperlipidemia 03/06/2019  . Parotitis 11/01/2018  . Syncope 10/30/2018  . Facial cellulitis 10/30/2018  . Chronic respiratory failure with hypoxia ([place]) 10/30/2018  . Chronic diastolic heart failure ([place])   . Hypoxia   . Atrial fibrillation ([place])   . COPD with acute exacerbation ([place]) 07/31/2018  . Hyponatremia 07/31/2018  . COPD (chronic obstructive pulmonary disease) ([place]) 05/21/2018  . Stage 3 chronic kidney disease 05/21/2018  . Diastolic congestive heart failure ([place]) 05/21/2018  . Type 2 diabetes mellitus ([place]) 05/21/2018  . HTN (hypertension) 05/21/2018  . Iron deficiency anemia due to chronic blood loss 04/30/2018  . Respiratory  distress   . Hematochezia   . Goals of care, counseling/discussion   . Palliative care by specialist   . DNR (do not resuscitate) discussion   . Acute renal failure with acute tubular necrosis superimposed on stage 3 chronic kidney disease ([place])   . Palliative care encounter   . COPD exacerbation ([place]) 04/17/2018  . Acute on chronic diastolic CHF (congestive heart failure) ([place])   . Severe anemia 02/17/2018  . Bradycardia 02/17/2018  . Hypothermia   . Gastroesophageal reflux disease   . Acute encephalopathy 09/25/2017  . On home O2 09/25/2017  . Bilateral lower extremity edema 06/27/2017  . Insomnia 03/28/2017  . CKD (chronic kidney disease), stage III 03/07/2017  . AF (paroxysmal atrial fibrillation) ([place]) 03/05/2017  . Chronic diastolic CHF (congestive heart failure) ([place]) 03/04/2017  . HCAP (healthcare-associated pneumonia) 03/04/2017  . Sepsis due to pneumonia ([place]) 02/25/2017  . Constipation 02/25/2017  . Overflow diarrhea/Constipation 02/25/2017  . DM type 2 causing vascular disease ([place]) 10/11/2016  . Non compliance w medication regimen 12/02/2015  . Hypercholesterolemia 05/12/2014  . Acute on chronic respiratory failure with hypoxemia ([place]) 11/17/2013  . Elevated PSA 01/20/2013  . Hypertension   . Elevated lipids   . Pulmonary fibrosis ([place])   . Colon polyps   . Colon polyps     Past Surgical History:  Procedure Laterality Date  . BIOPSY  04/23/2018   Procedure: BIOPSY;  Surgeon: Oneida Alar,  Marga Melnick, MD;  [facility];  Service: Endoscopy;;  duodenum gastric  . CATARACT EXTRACTION W/PHACO  06/25/2012   Procedure: CATARACT EXTRACTION PHACO AND INTRAOCULAR LENS PLACEMENT (IOC);  Surgeon: Elta Guadeloupe T. Gershon Crane, MD;  Location: [facility];  Service: Ophthalmology;  Laterality: Left;  CDE=19.01  . CATARACT EXTRACTION W/PHACO  07/09/2012   Procedure: CATARACT EXTRACTION PHACO AND INTRAOCULAR LENS PLACEMENT (IOC);  Surgeon: Elta Guadeloupe T. Gershon Crane, MD;  Location: [facility];  Service:  Ophthalmology;  Laterality: Right;  CDE: 20.09  . COLONOSCOPY WITH PROPOFOL N/A 04/23/2018   Procedure: COLONOSCOPY WITH PROPOFOL;  Surgeon: Danie Binder, MD;  [facility];  Service: Endoscopy;  Laterality: N/A;  . ESOPHAGOGASTRODUODENOSCOPY (EGD) WITH PROPOFOL N/A 04/23/2018   Procedure: ESOPHAGOGASTRODUODENOSCOPY (EGD) WITH PROPOFOL;  Surgeon: Danie Binder, MD;  [facility];  Service: Endoscopy;  Laterality: N/A;       Family History  Problem Relation Age of Onset  . Heart disease Mother   . CAD Other   . Diabetes Other     Social History   Tobacco Use  . Smoking status: Former Smoker    Packs/day: 1.50    Years: 60.00    Pack years: 90.00    Types: Cigarettes    Quit date: 12/31/2012    Years since quitting: 7.1  . Smokeless tobacco: Never Used  Substance Use Topics  . Alcohol use: No  . Drug use: No    Home Medications Prior to Admission medications   Medication Sig Start Date End Date Taking? Authorizing Provider  albuterol (PROVENTIL) (2.5 MG/3ML) 0.083% nebulizer solution INHALE 1 VIAL VIA NEBULIZER EVERY 6 HOURS AS NEEDED FOR WHEEZING OR SHORTNESS OF BREATH 08/12/19   Susy Frizzle, MD  budesonide-formoterol [facility]) 160-4.5 MCG/ACT inhaler Inhale 2 puffs into the lungs 2 (two) times daily. 01/13/20   Susy Frizzle, MD  cloNIDine (CATAPRES) 0.1 MG tablet TAKE 1 TABLET BY MOUTH 2 TIMES A DAY 01/29/20   Susy Frizzle, MD  ELIQUIS 5 MG TABS tablet Take 1 tablet by mouth twice daily 10/24/19   Susy Frizzle, MD  furosemide (LASIX) 40 MG tablet TAKE 1 TABLET BY MOUTH DAILY 12/22/19   Susy Frizzle, MD  gabapentin (NEURONTIN) 300 MG capsule Take 1 capsule (300 mg total) by mouth 3 (three) times daily. 02/13/20   Susy Frizzle, MD  HYDROcodone-acetaminophen (NORCO/VICODIN) 5-325 MG tablet Take 1 tablet by mouth every 6 (six) hours as needed for moderate pain. 02/19/20   Fransico Meadow, PA-C  Insulin Glargine (BASAGLAR KWIKPEN)  100 UNIT/ML SOPN Inject 0.3 mLs (30 Units total) into the skin at bedtime. 07/21/19   Susy Frizzle, MD  Insulin Pen Needle (PEN NEEDLES) 31G X 6 MM MISC Use with insulin qd 10/13/19   Susy Frizzle, MD  ipratropium-albuterol (DUONEB) 0.5-2.5 (3) MG/3ML SOLN Take 3 mLs by nebulization every 6 (six) hours as needed. 12/30/19   Susy Frizzle, MD  Lancets (ONETOUCH DELICA PLUS 123XX123) MISC USE TO CHECK BLOOD SUGAR TWICE DAILY AS DIRECTED 01/30/19   Susy Frizzle, MD  LORazepam (ATIVAN) 0.5 MG tablet TAKE 1 TABLET BY MOUTH ONCE DAILY AT BEDTIME AS NEEDED FOR SLEEP 10/27/19   Susy Frizzle, MD  metFORMIN (GLUCOPHAGE) 1000 MG tablet Take 1 tablet (1,000 mg total) by mouth 2 (two) times daily with a meal. 02/06/20   Susy Frizzle, MD  metoprolol tartrate (LOPRESSOR) 25 MG tablet TAKE 1 TABLET BY MOUTH TWICE  DAILY 01/30/20   Susy Frizzle, MD  [facility] ULTRA test strip CHECK FASTING BLOOD SUGAR TWICE DAILY 02/19/20   Susy Frizzle, MD  OXYGEN Inhale 3 L into the lungs continuous.     [provider]  potassium chloride SA (KLOR-CON) 20 MEQ tablet TAKE 1 TABLET BY MOUTH DAILY WITH LASIX 07/11/19   Susy Frizzle, MD  pravastatin (PRAVACHOL) 80 MG tablet TAKE 1 TABLET BY MOUTH AT BEDTIME 11/17/19   Susy Frizzle, MD    Allergies    Ace inhibitors  Review of Systems   Review of Systems  All other systems reviewed and are negative.   Physical Exam Updated Vital Signs BP (!) 147/67 (BP Location: Left Arm)   Pulse 84   Temp 98.6 F (37 C) (Oral)   Resp 16   Ht 5' 0.5" (1.537 m)   Wt 72.6 kg   SpO2 92%   BMI 30.73 kg/m   Physical Exam Vitals and nursing note reviewed.  Constitutional:      Appearance: He is well-developed.  HENT:     Head: Normocephalic.  Cardiovascular:     Rate and Rhythm: Normal rate.  Pulmonary:     Effort: Pulmonary effort is normal.  Abdominal:     General: There is no distension.  Musculoskeletal:        General: Normal  range of motion.     Cervical back: Normal range of motion.  Skin:    Findings: Rash present.     Comments: Fading rash right arm, 2 new blisters,   Neurological:     Mental Status: He is alert and oriented to person, place, and time.  Psychiatric:        Mood and Affect: Mood normal.     ED Results / Procedures / Treatments   Labs (all labs ordered are listed, but only abnormal results are displayed) Labs Reviewed - No data to display  EKG None  Radiology No results found.  Procedures Procedures (including critical care time)  Medications Ordered in ED Medications  HYDROcodone-acetaminophen (NORCO/VICODIN) 5-325 MG per tablet 2 tablet (has no administration in time range)    ED Course  I have reviewed the triage vital signs and the nursing notes.  Pertinent labs & imaging results that were available during my care of the patient were reviewed by me and considered in my medical decision making (see chart for details).    MDM Rules/Calculators/A&P                      MDM:  Pt advised to continue gabapentin.  Rx for hydrocodone.  Final Clinical Impression(s) / ED Diagnoses Final diagnoses:  Neuralgia    Rx / DC Orders ED Discharge Orders         Ordered    HYDROcodone-acetaminophen (NORCO/VICODIN) 5-325 MG tablet  Every 6 hours PRN     02/19/20 1903        An After Visit Summary was printed and given to the patient.    Sidney Ace 02/19/20 1910    Daleen Bo, MD 02/20/20 [phone]

## 2020-02-19 NOTE — Discharge Instructions (Signed)
See your Physician for recheck.  °

## 2020-02-19 NOTE — ED Triage Notes (Signed)
Pt states unable to raise right arm for a month.  Has seen his doctor and pt had shingles, shingles has cleared but pt states still unable to raise his arm up.  Pt has been taking Gabapentin as prescribed.

## 2020-02-23 ENCOUNTER — Other Ambulatory Visit: Payer: Self-pay | Admitting: Family Medicine

## 2020-02-23 ENCOUNTER — Other Ambulatory Visit: Payer: Self-pay

## 2020-02-23 ENCOUNTER — Ambulatory Visit (INDEPENDENT_AMBULATORY_CARE_PROVIDER_SITE_OTHER): Payer: Medicare Other | Admitting: Family Medicine

## 2020-02-23 VITALS — BP 120/80 | HR 75 | Temp 97.0°F | Wt 165.0 lb

## 2020-02-23 DIAGNOSIS — I5032 Chronic diastolic (congestive) heart failure: Secondary | ICD-10-CM

## 2020-02-23 DIAGNOSIS — Z09 Encounter for follow-up examination after completed treatment for conditions other than malignant neoplasm: Secondary | ICD-10-CM

## 2020-02-23 DIAGNOSIS — R600 Localized edema: Secondary | ICD-10-CM

## 2020-02-23 DIAGNOSIS — B0229 Other postherpetic nervous system involvement: Secondary | ICD-10-CM | POA: Diagnosis not present

## 2020-02-23 MED ORDER — OXYCODONE-ACETAMINOPHEN 7.5-325 MG PO TABS: 1.0000 | ORAL_TABLET | ORAL | 0 refills | Status: DC | PRN

## 2020-02-23 MED ORDER — PREGABALIN 100 MG PO CAPS: 100.0000 mg | ORAL_CAPSULE | Freq: Two times a day (BID) | ORAL | 1 refills | Status: DC

## 2020-02-23 NOTE — Progress Notes (Addendum)
Subjective:    Patient ID: Edward [name], male    DOB: 04/12/1935, 85 y.o.   MRN: [id number]  HPI  01/13/20 Patient was seen in the emergency room on April 10 for severe pain in his right arm.  At that time there was no rash.  Presumptive diagnosis was cervical radiculopathy.  CT scanning was obtained of the cervical spine for further evaluation.  Patient had mild foraminal narrowing on the right and moderate foraminal narrowing on the left but no obvious nerve impingement.  Started on hydrocodone.  Over the last day he has developed a rash in his right upper extremity.  Please see the photograph below.  Patient reports burning stinging fire-like pain radiating up and down his right arm from his neck to his fingertips.  The pain is severe.  He is about out of hydrocodone that was given to him in the emergency room.  Rash is characterized by numerous erythematous papules coalescing into plaques on the length of the arm from his shoulder to his hand.  There are numerous erythematous nodules.  There are no visible vesicles.  At that time, my plan was: Begin Valtrex 1 g p.o. 3 times daily for 7 days.  Refill hydrocodone 5/325 1 p.o. every 6 hours number 40 tablets.  Reassess in 1 week.  If pain worsens, consider adding gabapentin.  01/23/20 Patient states that he is hurting in his neck down his right shoulder into his right arm all the way to his right fingertips.  He is questioning why this is happening.  The rash that I saw him last office visit has turned into scabbed papules consistent with resolving shingles.  The vesicles are beginning to scab over and dissipate.  However the patient states the pain is no better.  He also recently went to the emergency room with weakness and was found to have a blood sugar in the 50s.  He takes 30 units of Basaglar at night prior to bed.  Sometimes he forgets to take a snack.  His last hemoglobin A1c was near 11 suggesting that his average blood sugar is close to 300.   However he states that his fasting blood sugars in the morning are quite often in the 40s and 50s!.  I question the veracity of this however I do think that this is a patient that has a high risk of hypoglycemia and therefore taking his insulin at night is a poor choice.  At that time, my plan was: Diabetes sounds poorly controlled.  I have recommended that the patient begin Lantus 30 units in the morning to avoid early a.m. hypoglycemia.  I have asked him to check his blood sugars fasting and 2 hours postprandial and notify me in 1 week with how sugars are running since he switches his insulin to a different time of day.  I will also check his hemoglobin A1c.  Patient is a poor candidate for mealtime insulin.  However he may benefit from an insulin sensitizer such as Actos.  Shingles is slowly resolving.  I did refill his pain medication.  02/10/20   Continues to have severe pain radiating down the right arm in the location where he previously had his shingles outbreak.  Examination of the skin shows slowly resolving erythematous papules in a dermatomal distribution down the right arm.  The pain radiates from his posterior right shoulder all the way into his fingertips.  The pain is intense burning and stinging in nature.  He is already  run out of his hydrocodone.  At that time, my plan was: Try gabapentin 100 mg p.o. 3 times daily.  Recheck later this week and uptitrate to 300 mg 3 times daily if the patient is tolerating the medication and if necessary for pain control.  Monitor for sedation and dizziness.  02/23/20 Patient has increase his gabapentin to 300 mg 3 times a day but he has seen no benefit with regards to pain control in his right arm.  He went to the emergency room last week due to the pain in his arm.  He was given hydrocodone and Lyrica addition to his gabapentin.  He states that nothing is helping.  He reports pain that radiates from his right shoulder all the way down to his right hand.   There are healing vesicles up and down his arm.  In fact most are now faint hyperpigmented macules.  There are no residual blisters seen.  There is no skin breakdown.  There is no erythema or secondary cellulitis or swelling.  He has normal strength in his right arm.  He has normal range of motion.  Nothing makes the pain worse or better.  He denies any neck pain.  Past Medical History:  Diagnosis Date  . Allergy    Rhinitis  . Atrial fibrillation ([place])   . Bronchitis   . Chronic respiratory failure ([place])   . Colon polyps   . COPD (chronic obstructive pulmonary disease) ([place])   . Diabetes mellitus   . Elevated lipids   . Hypercholesterolemia   . Hypertension   . Iron deficiency anemia due to chronic blood loss 04/30/2018  . Noncompliance   . On home O2    2L N/C   . PSA elevation   . Pulmonary fibrosis ([place])   . Vitamin D deficiency    Past Surgical History:  Procedure Laterality Date  . BIOPSY  04/23/2018   Procedure: BIOPSY;  Surgeon: Danie Binder, MD;  Location: [facility];  Service: Endoscopy;;  duodenum gastric  . CATARACT EXTRACTION W/PHACO  06/25/2012   Procedure: CATARACT EXTRACTION PHACO AND INTRAOCULAR LENS PLACEMENT (IOC);  Surgeon: Elta Guadeloupe T. Gershon Crane, MD;  Location: [facility];  Service: Ophthalmology;  Laterality: Left;  CDE=19.01  . CATARACT EXTRACTION W/PHACO  07/09/2012   Procedure: CATARACT EXTRACTION PHACO AND INTRAOCULAR LENS PLACEMENT (IOC);  Surgeon: Elta Guadeloupe T. Gershon Crane, MD;  Location: [facility];  Service: Ophthalmology;  Laterality: Right;  CDE: 20.09  . COLONOSCOPY WITH PROPOFOL N/A 04/23/2018   Procedure: COLONOSCOPY WITH PROPOFOL;  Surgeon: Danie Binder, MD;  Location: [facility];  Service: Endoscopy;  Laterality: N/A;  . ESOPHAGOGASTRODUODENOSCOPY (EGD) WITH PROPOFOL N/A 04/23/2018   Procedure: ESOPHAGOGASTRODUODENOSCOPY (EGD) WITH PROPOFOL;  Surgeon: Danie Binder, MD;  Location: [facility];  Service: Endoscopy;  Laterality: N/A;   Current Outpatient  Medications on File Prior to Visit  Medication Sig Dispense Refill  . albuterol (PROVENTIL) (2.5 MG/3ML) 0.083% nebulizer solution INHALE 1 VIAL VIA NEBULIZER EVERY 6 HOURS AS NEEDED FOR WHEEZING OR SHORTNESS OF BREATH 360 mL 4  . budesonide-formoterol (SYMBICORT) 160-4.5 MCG/ACT inhaler Inhale 2 puffs into the lungs 2 (two) times daily. 1 Inhaler 3  . cloNIDine (CATAPRES) 0.1 MG tablet TAKE 1 TABLET BY MOUTH 2 TIMES A DAY 60 tablet 3  . ELIQUIS 5 MG TABS tablet Take 1 tablet by mouth twice daily 60 tablet 3  . furosemide (LASIX) 40 MG tablet TAKE 1 TABLET BY MOUTH DAILY 30 tablet 3  .  gabapentin (NEURONTIN) 300 MG capsule Take 1 capsule (300 mg total) by mouth 3 (three) times daily. 90 capsule 3  . HYDROcodone-acetaminophen (NORCO/VICODIN) 5-325 MG tablet Take 1 tablet by mouth every 6 (six) hours as needed for moderate pain. 15 tablet 0  . Insulin Glargine (BASAGLAR KWIKPEN) 100 UNIT/ML SOPN Inject 0.3 mLs (30 Units total) into the skin at bedtime. 10 pen 3  . Insulin Pen Needle (PEN NEEDLES) 31G X 6 MM MISC Use with insulin qd 100 each 2  . ipratropium-albuterol (DUONEB) 0.5-2.5 (3) MG/3ML SOLN Take 3 mLs by nebulization every 6 (six) hours as needed. 360 mL 1  . Lancets (ONETOUCH DELICA PLUS 123XX123) MISC USE TO CHECK BLOOD SUGAR TWICE DAILY AS DIRECTED 100 each 2  . LORazepam (ATIVAN) 0.5 MG tablet TAKE 1 TABLET BY MOUTH ONCE DAILY AT BEDTIME AS NEEDED FOR SLEEP 30 tablet 1  . metFORMIN (GLUCOPHAGE) 1000 MG tablet Take 1 tablet (1,000 mg total) by mouth 2 (two) times daily with a meal. 60 tablet 11  . metoprolol tartrate (LOPRESSOR) 25 MG tablet TAKE 1 TABLET BY MOUTH TWICE DAILY 60 tablet 3  . ONETOUCH ULTRA test strip CHECK FASTING BLOOD SUGAR TWICE DAILY 50 strip 3  . OXYGEN Inhale 3 L into the lungs continuous.     . potassium chloride SA (KLOR-CON) 20 MEQ tablet TAKE 1 TABLET BY MOUTH DAILY WITH LASIX 30 tablet 2  . pravastatin (PRAVACHOL) 80 MG tablet TAKE 1 TABLET BY MOUTH AT BEDTIME  30 tablet 3   No current facility-administered medications on file prior to visit.   Allergies  Allergen Reactions  . Ace Inhibitors Other (See Comments)    Hyperkalemia--07/23/2013:patient states not familiar with the following allergy   Social History   Socioeconomic History  . Marital status: Married    Spouse name: Not on file  . Number of children: Not on file  . Years of education: Not on file  . Highest education level: Not on file  Occupational History  . 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 Smoking status: Former Smoker    Packs/day: 1.50    Years: 60.00    Pack years: 90.00    Types: Cigarettes    Quit date: 12/31/2012    Years since quitting: 7.1  . Smokeless tobacco: Never Used  Substance and Sexual Activity  . Alcohol use: No  . Drug use: No  . Sexual activity: Yes    Birth control/protection: None  Other Topics Concern  . Not on file  Social History Narrative  . Not on file   Social Determinants of Health   Financial Resource Strain: Low Risk   . Difficulty of Paying Living Expenses: Not very hard  Food Insecurity:   . Worried About Charity fundraiser in the Last Year:   . Arboriculturist in the Last Year:   Transportation Needs: No Transportation Needs  . Lack of Transportation (Medical): No  . Lack of Transportation (Non-Medical): No  Physical Activity: Inactive  . Days of Exercise per Week: 0 days  . Minutes of Exercise per Session: 0 min  Stress: No Stress Concern Present  . Feeling of Stress : Only a little  Social Connections:   . Frequency of Communication with Friends and Family:   . Frequency of Social Gatherings with Friends and Family:   . Attends Religious Services:   . Active Member of Clubs or Organizations:   . Attends Club or  Organization Meetings:   Marland Kitchen Marital Status:   Intimate Partner Violence:   . Fear of Current or Ex-Partner:   . Emotionally Abused:   Marland Kitchen Physically Abused:   . Sexually  Abused:      Review of Systems  All other systems reviewed and are negative.      Objective:   Physical Exam Vitals reviewed.  Constitutional:      General: He is not in acute distress.    Appearance: Normal appearance. He is obese. He is not ill-appearing, toxic-appearing or diaphoretic.  HENT:     Nose: Nose normal. No congestion or rhinorrhea.  Cardiovascular:     Rate and Rhythm: Normal rate and regular rhythm.     Heart sounds: Normal heart sounds. No murmur. No gallop.   Pulmonary:     Effort: Pulmonary effort is normal. Prolonged expiration present. No tachypnea, accessory muscle usage, respiratory distress or retractions.     Breath sounds: Decreased air movement present. No stridor. Examination of the right-upper field reveals decreased breath sounds and wheezing. Examination of the left-upper field reveals decreased breath sounds and wheezing. Examination of the right-lower field reveals decreased breath sounds and wheezing. Examination of the left-lower field reveals decreased breath sounds and wheezing. Decreased breath sounds and wheezing present. No rhonchi or rales.  Musculoskeletal:     Right lower leg: No edema.     Left lower leg: No edema.  Skin:    Findings: Rash present. Rash is macular.       Neurological:     Mental Status: He is alert.           Assessment & Plan:  Postherpetic neuralgia  Recommended discontinuation of gabapentin 300 mg given the relief.  We will switch to Lyrica 100 mg twice daily.  Also recommended discontinuation of hydrocodone.  Replace with oxycodone 7.5/325 1 p.o. every 6 hours as needed pain.  He was given 30 tablets.  Hopefully the Lyrica will for better pain control.

## 2020-02-25 ENCOUNTER — Encounter: Payer: Self-pay | Admitting: *Deleted

## 2020-02-25 ENCOUNTER — Other Ambulatory Visit: Payer: Self-pay | Admitting: *Deleted

## 2020-02-25 NOTE — Patient Outreach (Addendum)
[facility]) Care Management  02/25/2020  Edward [name] 02-Sep-1935 [id number]   [place]  Referral Date: 10/15/2018 Referral Source: Transfer from [place] Reason for Referral: Continued Disease Management Education Insurance:[employer] Medicare   Outreach Attempt:  Successful telephone outreach to patient for follow up.  HIPAA verified with patient.  Patient reporting he is not feeling well.  Unable to state what is wrong, just that he has not felt well the past 3-4 days.  Recent emergency room visit for continued left arm pain after shingles.  Just seen primary care 2 days ago.  Discussed importance of taking new medications (Lyrica and Oxycodone) with food to prevent nausea and vomiting.  Patient stating he is taking medications with food.  Encouraged patient to contact provider if pain persist with the new medication regimenReports fasting blood sugar this morning was 160.  Stating he has tried Glucerna but likes the Ensure taste better.  Discussed the sugar content of Ensure and encouraged patient to drink Glucerna if possible.  Does report one reading of blood sugar being 92.  Denies any increase in shortness of breath.  Reports using her nebulizer about 3 to 4 times a week and rescue inhaler about once or twice a day.  Appointments:  Attended appointment with primary care provider, Dr. Dennard Schaumann on 02/23/2020.  Plan: RN Health Coach will make next telephone outreach to patient within the month of June and patient agrees to future outreach.  [place] [phone] [name]

## 2020-02-27 ENCOUNTER — Ambulatory Visit: Payer: Self-pay | Admitting: Pharmacist

## 2020-02-27 NOTE — Chronic Care Management (AMB) (Addendum)
Chronic Care Management   Follow Up Note   02/27/2020 Name: Edward [name] MRN: [id number] DOB: 12-09-1934  Referred by: Susy Frizzle, MD Reason for referral : No chief complaint on file.   Edward [name] is a 85 y.o. year old male who is a primary care patient of [name], [name], MD. The CCM team was consulted for assistance with chronic disease management and care coordination needs.    Review of patient status, including review of consultants reports, relevant laboratory and other test results, and collaboration with appropriate care team members and the patient's provider was performed as part of comprehensive patient evaluation and provision of chronic care management services.    SDOH (Social Determinants of Health) assessments performed: No See Care Plan activities for detailed interventions related to [facility])      Outpatient Encounter Medications as of 02/27/2020  Medication Sig   albuterol (PROVENTIL) (2.5 MG/3ML) 0.083% nebulizer solution INHALE 1 VIAL VIA NEBULIZER EVERY 6 HOURS AS NEEDED FOR WHEEZING OR SHORTNESS OF BREATH   budesonide-formoterol (SYMBICORT) 160-4.5 MCG/ACT inhaler Inhale 2 puffs into the lungs 2 (two) times daily.   cloNIDine (CATAPRES) 0.1 MG tablet TAKE 1 TABLET BY MOUTH 2 TIMES A DAY   ELIQUIS 5 MG TABS tablet Take 1 tablet by mouth twice daily   furosemide (LASIX) 40 MG tablet TAKE 1 TABLET BY MOUTH DAILY   gabapentin (NEURONTIN) 300 MG capsule Take 1 capsule (300 mg total) by mouth 3 (three) times daily.   HYDROcodone-acetaminophen (NORCO/VICODIN) 5-325 MG tablet Take 1 tablet by mouth every 6 (six) hours as needed for moderate pain.   Insulin Glargine (BASAGLAR KWIKPEN) 100 UNIT/ML SOPN Inject 0.3 mLs (30 Units total) into the skin at bedtime.   Insulin Pen Needle (PEN NEEDLES) 31G X 6 MM MISC Use with insulin qd   ipratropium-albuterol (DUONEB) 0.5-2.5 (3) MG/3ML SOLN Take 3 mLs by nebulization every 6 (six) hours as needed.   Lancets (ONETOUCH  DELICA PLUS 123XX123) MISC USE TO CHECK BLOOD SUGAR TWICE DAILY AS DIRECTED   LORazepam (ATIVAN) 0.5 MG tablet TAKE 1 TABLET BY MOUTH ONCE DAILY AT BEDTIME AS NEEDED FOR SLEEP   metFORMIN (GLUCOPHAGE) 1000 MG tablet Take 1 tablet (1,000 mg total) by mouth 2 (two) times daily with a meal.   metoprolol tartrate (LOPRESSOR) 25 MG tablet TAKE 1 TABLET BY MOUTH TWICE DAILY   ONETOUCH ULTRA test strip CHECK FASTING BLOOD SUGAR TWICE DAILY   oxyCODONE-acetaminophen (PERCOCET) 7.5-325 MG tablet Take 1 tablet by mouth every 4 (four) hours as needed for severe pain.   OXYGEN Inhale 3 L into the lungs continuous.    potassium chloride SA (KLOR-CON) 20 MEQ tablet TAKE 1 TABLET BY MOUTH DAILY WITH LASIX   pravastatin (PRAVACHOL) 80 MG tablet TAKE 1 TABLET BY MOUTH AT BEDTIME   pregabalin (LYRICA) 100 MG capsule Take 1 capsule (100 mg total) by mouth 2 (two) times daily. Stop gabapentin   No facility-administered encounter medications on file as of 02/27/2020.     Objective:   Called patient assistance to follow up on application faxed [phone].  Program had not yet received fax.  Also reported he needs letter from LIS denial for continued coverage of other meds from patient assistance per message from [facility] representative.     Plan:   The care management team will reach out to the patient again over the next 7 days. Refaxed PAP application and will follow up again next week to confirm receipt.   Jacki Couse  Rosana Hoes, PharmD Clinical Pharmacist [place] [phone]   I have collaborated with the care management provider regarding care management and care coordination activities outlined in this encounter and have reviewed this encounter including documentation in the note and care plan. I am certifying that I agree with the content of this note and encounter as supervising physician.

## 2020-03-02 DIAGNOSIS — J449 Chronic obstructive pulmonary disease, unspecified: Secondary | ICD-10-CM | POA: Diagnosis not present

## 2020-03-05 ENCOUNTER — Other Ambulatory Visit: Payer: Self-pay | Admitting: Family Medicine

## 2020-03-08 ENCOUNTER — Telehealth: Payer: Self-pay | Admitting: Family Medicine

## 2020-03-08 MED ORDER — OXYCODONE-ACETAMINOPHEN 7.5-325 MG PO TABS: 1.0000 | ORAL_TABLET | ORAL | 0 refills | Status: DC | PRN

## 2020-03-08 NOTE — Telephone Encounter (Signed)
Requesting a refill on oxycodone  CB# [phone]

## 2020-03-08 NOTE — Telephone Encounter (Signed)
Caller was not available to receive message left by MD.

## 2020-03-08 NOTE — Telephone Encounter (Signed)
I will refill the oxycodone today He needs a discussion with Dr. Dennard Schaumann about chronic pain management for his post-herpetic neuralgia

## 2020-03-11 ENCOUNTER — Ambulatory Visit: Payer: Medicare Other | Admitting: Pharmacist

## 2020-03-11 DIAGNOSIS — B0229 Other postherpetic nervous system involvement: Secondary | ICD-10-CM

## 2020-03-11 DIAGNOSIS — E1140 Type 2 diabetes mellitus with diabetic neuropathy, unspecified: Secondary | ICD-10-CM

## 2020-03-11 NOTE — Patient Instructions (Addendum)
Visit Information   Thanks for meeting with me again!  Please reach out if I can be of any assistance.  Goals Addressed            This Visit's Progress   . Post Herpetic Neuralgia       CARE PLAN ENTRY  Current Barriers:  . Chronic Disease Management support, education, and care coordination needs related to  post herpetic neuralgia  Pharmacist Clinical Goal(s):  Marland Kitchen Over the next 180 days, patient will work with pharmacist to achieve goal of optimal pain management and medication to treat symptoms of post herpetic neuralgia.  Interventions: . Comprehensive medication review performed. . Recommend follow up with PCP for long-term post herpetic neuralgia treatment. . Counseled on appropriate regimen based on current medication list.  Patient Self Care Activities:  . Patient verbalizes understanding of plan as described above, Calls provider office for new concerns or questions, and will pick up new medication for Lyrica if not already done so. . Over the next 180 days patient will contact PharmD or PCP if there is no symptom relief from neuralgia.  Initial goal documentation     . Type II Diabetes - A1c < 7%       CARE PLAN ENTRY (see longitudinal plan of care for additional care plan information)  Current Barriers:  . Type II diabetes complicated by chronic medical conditions including hyperlipidemia, hypertension, and COPD. Lab Results  Component Value Date   HGBA1C 11.8 (H) 01/23/2020 .   Lab Results  Component Value Date   CREATININE 0.88 01/23/2020   CREATININE 0.88 01/19/2020   CREATININE 1.07 12/27/2019   . Current antihyperglycemic regimen: Metformin 1000mg  twice daily, Basaglar 100u/ml 30 units hs . Denies hypoglycemic symptoms, including dizziness, lightheadedness, shaking, sweating . 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 . Current meal patterns: o Breakfast:Biscuit daily, sometimes grits, eggs o Supper:Cornbread, tomato sandwich on white bread, banana  sandwich on white bread o Snacks: peanut butter on crackers o Drinks:diet soda . Current exercise: none . Current blood glucose readings:   186  166  160  116  139  110  100  142  127  219  123  178  95    Pharmacist Clinical Goal(s):  Marland Kitchen Over the next 180 days, patient will work with PharmD and primary care provider to address hyperglycemia and elevated A1c.  Interventions: . Comprehensive medication review performed, medication list updated in electronic medical record . Inter-disciplinary care team collaboration (see longitudinal plan of care) . Check blood sugar nightly a couple times per week and record in log until next meeting with PharmD. . Work on patient assistance for [employer].  Patient Self Care Activities:  . Patient will check blood glucose once or twice daily, document, and provide at future appointments . Patient will focus on medication adherence by pill count . Patient will take medications as prescribed . Patient will contact provider with any episodes of hypoglycemia . Patient will report any questions or concerns to provider  . Over the next 180 days patient will try to check blood sugar and additional time daily, preferably night time 2 hours after eating dinner and report consistent readings > 150 to PharmD or PCP.  Please see past updates related to this goal by clicking on the "Past Updates" button in the selected goal         The patient verbalized understanding of instructions provided today and agreed to receive a mailed copy of patient instruction and/or educational  materials.  Telephone follow up appointment with pharmacy team member scheduled for: December 2021  Beverly Milch, PharmD Clinical Pharmacist [place] [phone]   Carbohydrate Counting for Diabetes Mellitus, Adult  Carbohydrate counting is a method of keeping track of how many carbohydrates you eat. Eating carbohydrates naturally increases the  amount of sugar (glucose) in the blood. Counting how many carbohydrates you eat helps keep your blood glucose within normal limits, which helps you manage your diabetes (diabetes mellitus). It is important to know how many carbohydrates you can safely have in each meal. This is different for every person. A diet and nutrition specialist (registered dietitian) can help you make a meal plan and calculate how many carbohydrates you should have at each meal and snack. Carbohydrates are found in the following foods:  Grains, such as breads and cereals.  Dried beans and soy products.  Starchy vegetables, such as potatoes, peas, and corn.  Fruit and fruit juices.  Milk and yogurt.  Sweets and snack foods, such as cake, cookies, candy, chips, and soft drinks. How do I count carbohydrates? There are two ways to count carbohydrates in food. You can use either of the methods or a combination of both. Reading "Nutrition Facts" on packaged food The "Nutrition Facts" list is included on the labels of almost all packaged foods and beverages in the [place] It includes:  The serving size.  Information about nutrients in each serving, including the grams (g) of carbohydrate per serving. To use the "Nutrition Facts":  Decide how many servings you will have.  Multiply the number of servings by the number of carbohydrates per serving.  The resulting number is the total amount of carbohydrates that you will be having. Learning standard serving sizes of other foods When you eat carbohydrate foods that are not packaged or do not include "Nutrition Facts" on the label, you need to measure the servings in order to count the amount of carbohydrates:  Measure the foods that you will eat with a food scale or measuring cup, if needed.  Decide how many standard-size servings you will eat.  Multiply the number of servings by 15. Most carbohydrate-rich foods have about 15 g of carbohydrates per serving. ? For  example, if you eat 8 oz (170 g) of strawberries, you will have eaten 2 servings and 30 g of carbohydrates (2 servings x 15 g = 30 g).  For foods that have more than one food mixed, such as soups and casseroles, you must count the carbohydrates in each food that is included. The following list contains standard serving sizes of common carbohydrate-rich foods. Each of these servings has about 15 g of carbohydrates:   hamburger bun or  English muffin.   oz (15 mL) syrup.   oz (14 g) jelly.  1 slice of bread.  1 six-inch tortilla.  3 oz (85 g) cooked rice or pasta.  4 oz (113 g) cooked dried beans.  4 oz (113 g) starchy vegetable, such as peas, corn, or potatoes.  4 oz (113 g) hot cereal.  4 oz (113 g) mashed potatoes or  of a large baked potato.  4 oz (113 g) canned or frozen fruit.  4 oz (120 mL) fruit juice.  4-6 crackers.  6 chicken nuggets.  6 oz (170 g) unsweetened dry cereal.  6 oz (170 g) plain fat-free yogurt or yogurt sweetened with artificial sweeteners.  8 oz (240 mL) milk.  8 oz (170 g) fresh fruit or one  small piece of fruit.  24 oz (680 g) popped popcorn. Example of carbohydrate counting Sample meal  3 oz (85 g) chicken breast.  6 oz (170 g) brown rice.  4 oz (113 g) corn.  8 oz (240 mL) milk.  8 oz (170 g) strawberries with sugar-free whipped topping. Carbohydrate calculation 1. Identify the foods that contain carbohydrates: ? Rice. ? Corn. ? Milk. ? Strawberries. 2. Calculate how many servings you have of each food: ? 2 servings rice. ? 1 serving corn. ? 1 serving milk. ? 1 serving strawberries. 3. Multiply each number of servings by 15 g: ? 2 servings rice x 15 g = 30 g. ? 1 serving corn x 15 g = 15 g. ? 1 serving milk x 15 g = 15 g. ? 1 serving strawberries x 15 g = 15 g. 4. Add together all of the amounts to find the total grams of carbohydrates eaten: ? 30 g + 15 g + 15 g + 15 g = 75 g of carbohydrates  total. Summary  Carbohydrate counting is a method of keeping track of how many carbohydrates you eat.  Eating carbohydrates naturally increases the amount of sugar (glucose) in the blood.  Counting how many carbohydrates you eat helps keep your blood glucose within normal limits, which helps you manage your diabetes.  A diet and nutrition specialist (registered dietitian) can help you make a meal plan and calculate how many carbohydrates you should have at each meal and snack. This information is not intended to replace advice given to you by your health care provider. Make sure you discuss any questions you have with your health care provider. Document Revised: 04/12/2017 Document Reviewed: 03/01/2016 Elsevier Patient Education  [place].

## 2020-03-11 NOTE — Chronic Care Management (AMB) (Addendum)
Chronic Care Management   Follow Up Note   03/11/2020 Name: Edward [name] MRN: [id number] DOB: July 14, 1935  Referred by: Susy Frizzle, MD Reason for referral : Chronic Care Management (PharmD Follow up)   Edward [name] is a 85 y.o. year old male who is a primary care patient of [name], [name], MD. The CCM team was consulted for assistance with chronic disease management and care coordination needs.    Review of patient status, including review of consultants reports, relevant laboratory and other test results, and collaboration with appropriate care team members and the patient's provider was performed as part of comprehensive patient evaluation and provision of chronic care management services.    SDOH (Social Determinants of Health) assessments performed: No See Care Plan activities for detailed interventions related to [facility])      Outpatient Encounter Medications as of 03/11/2020  Medication Sig   albuterol (PROVENTIL) (2.5 MG/3ML) 0.083% nebulizer solution INHALE 1 VIAL VIA NEBULIZER EVERY 6 HOURS AS NEEDED FOR WHEEZING OR SHORTNESS OF BREATH   budesonide-formoterol (SYMBICORT) 160-4.5 MCG/ACT inhaler Inhale 2 puffs into the lungs 2 (two) times daily.   cloNIDine (CATAPRES) 0.1 MG tablet TAKE 1 TABLET BY MOUTH 2 TIMES A DAY   ELIQUIS 5 MG TABS tablet Take 1 tablet by mouth twice daily   furosemide (LASIX) 40 MG tablet TAKE 1 TABLET BY MOUTH DAILY   gabapentin (NEURONTIN) 300 MG capsule Take 1 capsule (300 mg total) by mouth 3 (three) times daily.   HYDROcodone-acetaminophen (NORCO/VICODIN) 5-325 MG tablet Take 1 tablet by mouth every 6 (six) hours as needed for moderate pain. (Patient not taking: Reported on 03/11/2020)   Insulin Glargine (BASAGLAR KWIKPEN) 100 UNIT/ML SOPN Inject 0.3 mLs (30 Units total) into the skin at bedtime.   Insulin Pen Needle (PEN NEEDLES) 31G X 6 MM MISC Use with insulin qd   ipratropium-albuterol (DUONEB) 0.5-2.5 (3) MG/3ML SOLN Take 3 mLs by  nebulization every 6 (six) hours as needed.   Lancets (ONETOUCH DELICA PLUS [id number]) MISC USE TO CHECK BLOOD SUGAR TWICE DAILY AS DIRECTED   LORazepam (ATIVAN) 0.5 MG tablet TAKE 1 TABLET BY MOUTH ONCE DAILY AT BEDTIME AS NEEDED FOR SLEEP   metFORMIN (GLUCOPHAGE) 1000 MG tablet Take 1 tablet (1,000 mg total) by mouth 2 (two) times daily with a meal.   metoprolol tartrate (LOPRESSOR) 25 MG tablet TAKE 1 TABLET BY MOUTH TWICE DAILY   ONETOUCH ULTRA test strip CHECK FASTING BLOOD SUGAR TWICE DAILY   oxyCODONE-acetaminophen (PERCOCET) 7.5-325 MG tablet Take 1 tablet by mouth every 4 (four) hours as needed for severe pain.   OXYGEN Inhale 3 L into the lungs continuous.    potassium chloride SA (KLOR-CON) 20 MEQ tablet TAKE 1 TABLET BY MOUTH DAILY WITH LASIX   pravastatin (PRAVACHOL) 80 MG tablet TAKE 1 TABLET BY MOUTH AT BEDTIME   pregabalin (LYRICA) 100 MG capsule Take 1 capsule (100 mg total) by mouth 2 (two) times daily. Stop gabapentin   No facility-administered encounter medications on file as of 03/11/2020.     Objective:   Goals Addressed             This Visit's Progress    Post Herpetic Neuralgia       CARE PLAN ENTRY  Current Barriers:  Chronic Disease Management support, education, and care coordination needs related to  post herpetic neuralgia  Pharmacist Clinical Goal(s):  Over the next 180 days, patient will work with pharmacist to achieve goal of optimal pain  management and medication to treat symptoms of post herpetic neuralgia.  Interventions: Comprehensive medication review performed. Recommend follow up with PCP for long-term post herpetic neuralgia treatment. Counseled on appropriate regimen based on current medication list.  Patient Self Care Activities:  Patient verbalizes understanding of plan as described above, Calls provider office for new concerns or questions, and will pick up new medication for Lyrica if not already done so. Over the next 180 days  patient will contact PharmD or PCP if there is no symptom relief from neuralgia.  Initial goal documentation      Type II Diabetes - A1c < 7%       CARE PLAN ENTRY (see longitudinal plan of care for additional care plan information)  Current Barriers:  Type II diabetes complicated by chronic medical conditions including hyperlipidemia, hypertension, and COPD. 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 Current meal patterns: Breakfast:Biscuit daily, sometimes grits, eggs Supper:Cornbread, tomato sandwich on white bread, banana sandwich on white bread Snacks: peanut butter on crackers Drinks:diet soda Current exercise: none Current blood glucose readings:   186  166  160  116  139  110  100  142  127  219  123  178  95    Pharmacist Clinical Goal(s):  Over the next 180 days, patient will work with PharmD and primary care provider to address hyperglycemia and elevated A1c.  Interventions: Comprehensive medication review performed, medication list updated in electronic medical record Inter-disciplinary care team collaboration (see longitudinal plan of care) Check blood sugar nightly a couple times per week and record in log until next meeting with PharmD. Work on patient assistance for [employer].  Patient Self Care Activities:  Patient will check blood glucose once or twice daily, document, and provide at future appointments Patient will focus on medication adherence by pill count Patient will take medications as prescribed Patient will contact provider with any episodes of hypoglycemia Patient will  report any questions or concerns to provider  Over the next 180 days patient will try to check blood sugar and additional time daily, preferably night time 2 hours after eating dinner and report consistent readings > 150 to PharmD or PCP.  Please see past updates related to this goal by clicking on the "Past Updates" button in the selected goal         Diabetes   Recent Relevant Labs: Lab Results  Component Value Date/Time   HGBA1C 11.8 (H) 01/23/2020 02:36 PM   HGBA1C 10.5 (H) 10/13/2019 10:35 AM   HGBA1C >14.0 (H) 12/01/2015 08:31 AM   HGBA1C >14.0 (H) 05/27/2015 08:54 AM   MICROALBUR 378.4 01/23/2020 03:09 PM   MICROALBUR 144.9 02/10/2019 09:31 AM     Checking BG: Daily.  Checking in the morning before he takes insulin about 4:00 am and then sometimes before he eats dinner.  He brought in fasting blood glucose log today as well as some PPG readings "a couple hours" after he eats.  Recent FBG Readings: 186  166  160  116  139  110  100  142  127  219  123  178  95       Recent 2hr  PP BG readings:  223, 119, 180, 200, 330, 240, 189  Patient has failed these meds in past: novolog 70/30, glimpepiride, Actos, lantus Patient is currently uncontrolled on the following medications: Basaglar 30 units in the morning,  metformin 1000 mg twice daily  Last diabetic EYE exam:  Lab Results  Component Value Date/Time   [place] 05/14/2012 12:00 AM    Counseled again on carbohydrate servings and avoidance of large amounts of juice in the morning.  Patient not following strict diabetic diet at all.    Plan  Patient to work on limiting servings of carbohydrates to one per meal (30-45 g) and limit consumption of bread to once daily. Continue to pursue approval of Jardiance through PAP.  Post-herpetic neuralgia    Patient has failed these meds in past: gabapentin Patient is currently uncontrolled on the following medications: Lyrica 100mg  twice  daily  Patient unsure which medication he is currently on for nerve pain, discussed that he was supposed to stop the gabapentin and start Lyrica.  Wrote the name down on piece of paper for him to go home and confirm.  Pain still uncontrolled, says he cannot sleep.  Instructed him to schedule follow up with Dr. Dennard Schaumann for long term pain relief.  Plan  Continue current medications.  Make sure he has picked up Lyrica from pharmacy and tried it.  Follow up with Dr. Dennard Schaumann for long term pain relief options.  Plan:   Next PCP appointment scheduled for:  next week   Beverly Milch, PharmD Clinical Pharmacist [place] [phone] I have collaborated with the care management provider regarding care management and care coordination activities outlined in this encounter and have reviewed this encounter including documentation in the note and care plan. I am certifying that I agree with the content of this note and encounter as supervising physician.

## 2020-03-16 ENCOUNTER — Telehealth: Payer: Self-pay | Admitting: Family Medicine

## 2020-03-16 ENCOUNTER — Other Ambulatory Visit: Payer: Self-pay

## 2020-03-16 MED ORDER — OXYCODONE-ACETAMINOPHEN 7.5-325 MG PO TABS: 1.0000 | ORAL_TABLET | ORAL | 0 refills | Status: DC | PRN

## 2020-03-16 NOTE — Telephone Encounter (Signed)
Last refill 02/19/20

## 2020-03-16 NOTE — Telephone Encounter (Signed)
Rx refilled.

## 2020-03-16 NOTE — Telephone Encounter (Signed)
Rx refill sent to Dr. Dennard Schaumann

## 2020-03-16 NOTE — Telephone Encounter (Signed)
CB# [phone] Refill Oxycodone

## 2020-03-18 ENCOUNTER — Ambulatory Visit (INDEPENDENT_AMBULATORY_CARE_PROVIDER_SITE_OTHER): Payer: Medicare Other | Admitting: Family Medicine

## 2020-03-18 ENCOUNTER — Other Ambulatory Visit: Payer: Self-pay

## 2020-03-18 VITALS — BP 120/68 | HR 83 | Temp 98.9°F | Ht 65.0 in | Wt 172.0 lb

## 2020-03-18 DIAGNOSIS — B0229 Other postherpetic nervous system involvement: Secondary | ICD-10-CM

## 2020-03-18 MED ORDER — PREGABALIN 150 MG PO CAPS: 150.0000 mg | ORAL_CAPSULE | Freq: Two times a day (BID) | ORAL | 0 refills | Status: DC

## 2020-03-18 NOTE — Progress Notes (Signed)
Subjective:    Patient ID: Edward [name], male    DOB: 1934-12-08, 85 y.o.   MRN: [id number]  Arm Pain     01/13/20 Patient was seen in the emergency room on April 10 for severe pain in his right arm.  At that time there was no rash.  Presumptive diagnosis was cervical radiculopathy.  CT scanning was obtained of the cervical spine for further evaluation.  Patient had mild foraminal narrowing on the right and moderate foraminal narrowing on the left but no obvious nerve impingement.  Started on hydrocodone.  Over the last day he has developed a rash in his right upper extremity.  Please see the photograph below.  Patient reports burning stinging fire-like pain radiating up and down his right arm from his neck to his fingertips.  The pain is severe.  He is about out of hydrocodone that was given to him in the emergency room.  Rash is characterized by numerous erythematous papules coalescing into plaques on the length of the arm from his shoulder to his hand.  There are numerous erythematous nodules.  There are no visible vesicles.  At that time, my plan was: Begin Valtrex 1 g p.o. 3 times daily for 7 days.  Refill hydrocodone 5/325 1 p.o. every 6 hours number 40 tablets.  Reassess in 1 week.  If pain worsens, consider adding gabapentin.  01/23/20 Patient states that he is hurting in his neck down his right shoulder into his right arm all the way to his right fingertips.  He is questioning why this is happening.  The rash that I saw him last office visit has turned into scabbed papules consistent with resolving shingles.  The vesicles are beginning to scab over and dissipate.  However the patient states the pain is no better.  He also recently went to the emergency room with weakness and was found to have a blood sugar in the 50s.  He takes 30 units of Basaglar at night prior to bed.  Sometimes he forgets to take a snack.  His last hemoglobin A1c was near 11 suggesting that his average blood sugar is  close to 300.  However he states that his fasting blood sugars in the morning are quite often in the 40s and 50s!.  I question the veracity of this however I do think that this is a patient that has a high risk of hypoglycemia and therefore taking his insulin at night is a poor choice.  At that time, my plan was: Diabetes sounds poorly controlled.  I have recommended that the patient begin Lantus 30 units in the morning to avoid early a.m. hypoglycemia.  I have asked him to check his blood sugars fasting and 2 hours postprandial and notify me in 1 week with how sugars are running since he switches his insulin to a different time of day.  I will also check his hemoglobin A1c.  Patient is a poor candidate for mealtime insulin.  However he may benefit from an insulin sensitizer such as Actos.  Shingles is slowly resolving.  I did refill his pain medication.  02/10/20   Continues to have severe pain radiating down the right arm in the location where he previously had his shingles outbreak.  Examination of the skin shows slowly resolving erythematous papules in a dermatomal distribution down the right arm.  The pain radiates from his posterior right shoulder all the way into his fingertips.  The pain is intense burning and stinging in nature.  He is already run out of his hydrocodone.  At that time, my plan was: Try gabapentin 100 mg p.o. 3 times daily.  Recheck later this week and uptitrate to 300 mg 3 times daily if the patient is tolerating the medication and if necessary for pain control.  Monitor for sedation and dizziness.  02/23/20 Patient has increase his gabapentin to 300 mg 3 times a day but he has seen no benefit with regards to pain control in his right arm.  He went to the emergency room last week due to the pain in his arm.  He was given hydrocodone and Lyrica addition to his gabapentin.  He states that nothing is helping.  He reports pain that radiates from his right shoulder all the way down to  his right hand.  There are healing vesicles up and down his arm.  In fact most are now faint hyperpigmented macules.  There are no residual blisters seen.  There is no skin breakdown.  There is no erythema or secondary cellulitis or swelling.  He has normal strength in his right arm.  He has normal range of motion.  Nothing makes the pain worse or better.  He denies any neck pain.  At that time, my plan was: Recommended discontinuation of gabapentin 300 mg given the relief.  We will switch to Lyrica 100 mg twice daily.  Also recommended discontinuation of hydrocodone.  Replace with oxycodone 7.5/325 1 p.o. every 6 hours as needed pain.  He was given 30 tablets.  Hopefully the Lyrica will for better pain control.  03/18/20 Patient states that his arm still hurts.  The pain still radiates from his right shoulder into his hand.  It is still burning in nature and sharp in nature.  Patient is a difficult historian however he did start taking the Lyrica 100 mg twice a day.  He saw some benefit from the medication.  He also is taking oxycodone as needed and seeing temporary relief.  However the patient is frustrated by the fact the pain keeps coming back and does not stay away.  I explained to the patient that I believe he is dealing with nerve pain and that is going to take time for that to improve.  Past Medical History:  Diagnosis Date   Allergy    Rhinitis   Atrial fibrillation ([name])    Bronchitis    Chronic respiratory failure ([name])    Colon polyps    COPD (chronic obstructive pulmonary disease) ([place])    Diabetes mellitus    Elevated lipids    Hypercholesterolemia    Hypertension    Iron deficiency anemia due to chronic blood loss 04/30/2018   Noncompliance    On home O2    2L N/C    PSA elevation    Pulmonary fibrosis ([name])    Vitamin D deficiency    Past Surgical History:  Procedure Laterality Date   BIOPSY  04/23/2018   Procedure: BIOPSY;  Surgeon: Danie Binder, MD;   Location: [facility];  Service: Endoscopy;;  duodenum gastric   CATARACT EXTRACTION W/PHACO  06/25/2012   Procedure: CATARACT EXTRACTION PHACO AND INTRAOCULAR LENS PLACEMENT ([place]);  Surgeon: Elta Guadeloupe T. Gershon Crane, MD;  Location: [facility];  Service: Ophthalmology;  Laterality: Left;  CDE=19.01   CATARACT EXTRACTION W/PHACO  07/09/2012   Procedure: CATARACT EXTRACTION PHACO AND INTRAOCULAR LENS PLACEMENT ([facility]);  Surgeon: Elta Guadeloupe T. Gershon Crane, MD;  Location: [facility];  Service: Ophthalmology;  Laterality: Right;  CDE: 20.09  COLONOSCOPY WITH PROPOFOL N/A 04/23/2018   Procedure: COLONOSCOPY WITH PROPOFOL;  Surgeon: Danie Binder, MD;  Location: [facility];  Service: Endoscopy;  Laterality: N/A;   ESOPHAGOGASTRODUODENOSCOPY (EGD) WITH PROPOFOL N/A 04/23/2018   Procedure: ESOPHAGOGASTRODUODENOSCOPY (EGD) WITH PROPOFOL;  Surgeon: Danie Binder, MD;  Location: [facility];  Service: Endoscopy;  Laterality: N/A;   Current Outpatient Medications on File Prior to Visit  Medication Sig Dispense Refill   albuterol (PROVENTIL) (2.5 MG/3ML) 0.083% nebulizer solution INHALE 1 VIAL VIA NEBULIZER EVERY 6 HOURS AS NEEDED FOR WHEEZING OR SHORTNESS OF BREATH 360 mL 4   budesonide-formoterol (SYMBICORT) 160-4.5 MCG/ACT inhaler Inhale 2 puffs into the lungs 2 (two) times daily. 1 Inhaler 3   cloNIDine (CATAPRES) 0.1 MG tablet TAKE 1 TABLET BY MOUTH 2 TIMES A DAY 60 tablet 3   ELIQUIS 5 MG TABS tablet Take 1 tablet by mouth twice daily 60 tablet 11   furosemide (LASIX) 40 MG tablet TAKE 1 TABLET BY MOUTH DAILY 30 tablet 3   gabapentin (NEURONTIN) 300 MG capsule Take 1 capsule (300 mg total) by mouth 3 (three) times daily. 90 capsule 3   HYDROcodone-acetaminophen (NORCO/VICODIN) 5-325 MG tablet Take 1 tablet by mouth every 6 (six) hours as needed for moderate pain. (Patient not taking: Reported on 03/11/2020) 15 tablet 0   Insulin Glargine (BASAGLAR KWIKPEN) 100 UNIT/ML SOPN Inject 0.3 mLs (30 Units total) into the  skin at bedtime. 10 pen 3   Insulin Pen Needle (PEN NEEDLES) 31G X 6 MM MISC Use with insulin qd 100 each 2   ipratropium-albuterol (DUONEB) 0.5-2.5 (3) MG/3ML SOLN Take 3 mLs by nebulization every 6 (six) hours as needed. 360 mL 1   Lancets (ONETOUCH DELICA PLUS [id number]) MISC USE TO CHECK BLOOD SUGAR TWICE DAILY AS DIRECTED 100 each 2   LORazepam (ATIVAN) 0.5 MG tablet TAKE 1 TABLET BY MOUTH ONCE DAILY AT BEDTIME AS NEEDED FOR SLEEP 30 tablet 1   metFORMIN (GLUCOPHAGE) 1000 MG tablet Take 1 tablet (1,000 mg total) by mouth 2 (two) times daily with a meal. 60 tablet 11   metoprolol tartrate (LOPRESSOR) 25 MG tablet TAKE 1 TABLET BY MOUTH TWICE DAILY 60 tablet 3   ONETOUCH ULTRA test strip CHECK FASTING BLOOD SUGAR TWICE DAILY 50 strip 3   oxyCODONE-acetaminophen (PERCOCET) 7.5-325 MG tablet Take 1 tablet by mouth every 4 (four) hours as needed for severe pain. 30 tablet 0   OXYGEN Inhale 3 L into the lungs continuous.      potassium chloride SA (KLOR-CON) 20 MEQ tablet TAKE 1 TABLET BY MOUTH DAILY WITH LASIX 30 tablet 2   pravastatin (PRAVACHOL) 80 MG tablet TAKE 1 TABLET BY MOUTH AT BEDTIME 30 tablet 3   pregabalin (LYRICA) 100 MG capsule Take 1 capsule (100 mg total) by mouth 2 (two) times daily. Stop gabapentin 60 capsule 1   No current facility-administered medications on file prior to visit.   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  Tobacco Use   Smoking status: Former Smoker    Packs/day: 1.50  Years: 60.00    Pack years: 90.00    Types: Cigarettes    Quit date:  12/31/2012    Years since quitting: 7.2   Smokeless tobacco: Never Used  Vaping Use   Vaping Use: Never used  Substance and Sexual Activity   Alcohol use: No   Drug use: No   Sexual activity: Yes    Birth control/protection: None  Other Topics Concern   Not on file  Social History Narrative   Not on file   Social Determinants of Health   Financial Resource Strain: Low Risk    Difficulty of Paying Living Expenses: Not very hard  Food Insecurity:    Worried About Charity fundraiser in the Last Year:    Arboriculturist in the Last Year:   Transportation Needs: No Transportation Needs   Lack of Transportation (Medical): No   Lack of Transportation (Non-Medical): No  Physical Activity: Inactive   Days of Exercise per Week: 0 days   Minutes of Exercise per Session: 0 min  Stress: No Stress Concern Present   Feeling of Stress : Only a little  Social Connections:    Frequency of Communication with Friends and Family:    Frequency of Social Gatherings with Friends and Family:    Attends Religious Services:    Active Member of Clubs or Organizations:    Attends Music therapist:    Marital Status:   Intimate Partner Violence:    Fear of Current or Ex-Partner:    Emotionally Abused:    Physically Abused:    Sexually Abused:      Review of Systems  All other systems reviewed and are negative.      Objective:   Physical Exam Vitals reviewed.  Constitutional:      General: He is not in acute distress.    Appearance: Normal appearance. He is obese. He is not ill-appearing, toxic-appearing or diaphoretic.  HENT:     Nose: Nose normal. No congestion or rhinorrhea.  Cardiovascular:     Rate and Rhythm: Normal rate and regular rhythm.     Heart sounds: Normal heart sounds. No murmur heard.  No gallop.   Pulmonary:     Effort: Pulmonary effort is normal. Prolonged expiration present. No tachypnea, accessory muscle usage, respiratory  distress or retractions.     Breath sounds: Decreased air movement present. No stridor. Examination of the right-upper field reveals decreased breath sounds and wheezing. Examination of the left-upper field reveals decreased breath sounds and wheezing. Examination of the right-lower field reveals decreased breath sounds and wheezing. Examination of the left-lower field reveals decreased breath sounds and wheezing. Decreased breath sounds and wheezing present. No rhonchi or rales.  Musculoskeletal:     Right lower leg: No edema.     Left lower leg: No edema.  Skin:    Findings: No rash.  Neurological:     Mental Status: He is alert.           Assessment & Plan:  Postherpetic neuralgia  Rash is totally faded.  At this point I gave the patient 3 options.  He can continue his current medications at their present dosages and allow time for the pain to slowly improve.  He does admit that the pain is better than it was initially.  He can increase the dose of the Lyrica.  Lastly, the patient could be referred to a pain clinic to discuss potential nerve ablation in his right arm.  Patient  elects to try increasing the dose of Lyrica.  Increase to 150 mg twice a day.  Use oxycodone for breakthrough pain.  Neck step would be a referral to a pain clinic if persistent.

## 2020-03-23 ENCOUNTER — Other Ambulatory Visit: Payer: Self-pay

## 2020-03-23 ENCOUNTER — Other Ambulatory Visit: Payer: Self-pay | Admitting: Family Medicine

## 2020-03-23 ENCOUNTER — Encounter (HOSPITAL_COMMUNITY): Payer: Self-pay | Admitting: Emergency Medicine

## 2020-03-23 ENCOUNTER — Emergency Department (HOSPITAL_COMMUNITY): Payer: Medicare Other

## 2020-03-23 ENCOUNTER — Observation Stay (HOSPITAL_COMMUNITY)
Admission: EM | Admit: 2020-03-23 | Discharge: 2020-03-25 | Disposition: A | Discharge status: Home / Self Care | Payer: Medicare Other | Attending: Internal Medicine | Admitting: Internal Medicine

## 2020-03-23 DIAGNOSIS — D649 Anemia, unspecified: Secondary | ICD-10-CM | POA: Diagnosis present

## 2020-03-23 DIAGNOSIS — J449 Chronic obstructive pulmonary disease, unspecified: Secondary | ICD-10-CM | POA: Diagnosis present

## 2020-03-23 DIAGNOSIS — R4182 Altered mental status, unspecified: Secondary | ICD-10-CM | POA: Diagnosis not present

## 2020-03-23 DIAGNOSIS — E1165 Type 2 diabetes mellitus with hyperglycemia: Principal | ICD-10-CM | POA: Insufficient documentation

## 2020-03-23 DIAGNOSIS — E860 Dehydration: Secondary | ICD-10-CM | POA: Diagnosis not present

## 2020-03-23 DIAGNOSIS — R600 Localized edema: Secondary | ICD-10-CM

## 2020-03-23 DIAGNOSIS — Z743 Need for continuous supervision: Secondary | ICD-10-CM | POA: Diagnosis not present

## 2020-03-23 DIAGNOSIS — Z794 Long term (current) use of insulin: Secondary | ICD-10-CM | POA: Insufficient documentation

## 2020-03-23 DIAGNOSIS — R410 Disorientation, unspecified: Secondary | ICD-10-CM

## 2020-03-23 DIAGNOSIS — Z87891 Personal history of nicotine dependence: Secondary | ICD-10-CM | POA: Diagnosis not present

## 2020-03-23 DIAGNOSIS — R739 Hyperglycemia, unspecified: Secondary | ICD-10-CM

## 2020-03-23 DIAGNOSIS — R21 Rash and other nonspecific skin eruption: Secondary | ICD-10-CM | POA: Diagnosis not present

## 2020-03-23 DIAGNOSIS — I5023 Acute on chronic systolic (congestive) heart failure: Secondary | ICD-10-CM | POA: Diagnosis not present

## 2020-03-23 DIAGNOSIS — Z20822 Contact with and (suspected) exposure to covid-19: Secondary | ICD-10-CM | POA: Insufficient documentation

## 2020-03-23 DIAGNOSIS — Z7901 Long term (current) use of anticoagulants: Secondary | ICD-10-CM | POA: Diagnosis not present

## 2020-03-23 DIAGNOSIS — R531 Weakness: Secondary | ICD-10-CM | POA: Diagnosis not present

## 2020-03-23 DIAGNOSIS — N183 Chronic kidney disease, stage 3 unspecified: Secondary | ICD-10-CM | POA: Diagnosis not present

## 2020-03-23 DIAGNOSIS — Z7951 Long term (current) use of inhaled steroids: Secondary | ICD-10-CM | POA: Insufficient documentation

## 2020-03-23 DIAGNOSIS — I10 Essential (primary) hypertension: Secondary | ICD-10-CM | POA: Diagnosis present

## 2020-03-23 DIAGNOSIS — E1122 Type 2 diabetes mellitus with diabetic chronic kidney disease: Secondary | ICD-10-CM | POA: Insufficient documentation

## 2020-03-23 DIAGNOSIS — R29818 Other symptoms and signs involving the nervous system: Secondary | ICD-10-CM | POA: Diagnosis not present

## 2020-03-23 DIAGNOSIS — E1159 Type 2 diabetes mellitus with other circulatory complications: Secondary | ICD-10-CM | POA: Diagnosis present

## 2020-03-23 DIAGNOSIS — Z888 Allergy status to other drugs, medicaments and biological substances status: Secondary | ICD-10-CM | POA: Diagnosis not present

## 2020-03-23 DIAGNOSIS — I480 Paroxysmal atrial fibrillation: Secondary | ICD-10-CM | POA: Diagnosis present

## 2020-03-23 DIAGNOSIS — I130 Hypertensive heart and chronic kidney disease with heart failure and stage 1 through stage 4 chronic kidney disease, or unspecified chronic kidney disease: Secondary | ICD-10-CM | POA: Diagnosis not present

## 2020-03-23 DIAGNOSIS — I4891 Unspecified atrial fibrillation: Secondary | ICD-10-CM | POA: Insufficient documentation

## 2020-03-23 DIAGNOSIS — E782 Mixed hyperlipidemia: Secondary | ICD-10-CM | POA: Diagnosis present

## 2020-03-23 DIAGNOSIS — R062 Wheezing: Secondary | ICD-10-CM | POA: Diagnosis not present

## 2020-03-23 DIAGNOSIS — R0902 Hypoxemia: Secondary | ICD-10-CM | POA: Diagnosis not present

## 2020-03-23 DIAGNOSIS — I5032 Chronic diastolic (congestive) heart failure: Secondary | ICD-10-CM | POA: Diagnosis present

## 2020-03-23 DIAGNOSIS — Z09 Encounter for follow-up examination after completed treatment for conditions other than malignant neoplasm: Secondary | ICD-10-CM

## 2020-03-23 LAB — URINALYSIS, ROUTINE W REFLEX MICROSCOPIC
Bacteria, UA: NONE SEEN
Bilirubin Urine: NEGATIVE
Glucose, UA: >=500 mg/dL — AB
Ketones, ur: NEGATIVE mg/dL
Leukocytes,Ua: NEGATIVE
Nitrite: NEGATIVE
Protein, ur: >=300 mg/dL — AB
Specific Gravity, Urine: 1.015 (ref 1.005–1.030)
pH: 7 (ref 5.0–8.0)

## 2020-03-23 LAB — SARS CORONAVIRUS 2 BY RT PCR (HOSPITAL ORDER, PERFORMED IN ~~LOC~~ HOSPITAL LAB): SARS Coronavirus 2: NEGATIVE

## 2020-03-23 LAB — CBC WITH DIFFERENTIAL/PLATELET
Abs Immature Granulocytes: 0.05 10*3/uL (ref 0.00–0.07)
Basophils Absolute: 0 10*3/uL (ref 0.0–0.1)
Basophils Relative: 0 %
Eosinophils Absolute: 0.1 10*3/uL (ref 0.0–0.5)
Eosinophils Relative: 1 %
HCT: 29.6 % — ABNORMAL LOW (ref 39.0–52.0)
Hemoglobin: 9.4 g/dL — ABNORMAL LOW (ref 13.0–17.0)
Immature Granulocytes: 0 %
Lymphocytes Relative: 12 %
Lymphs Abs: 1.4 10*3/uL (ref 0.7–4.0)
MCH: 29.3 pg (ref 26.0–34.0)
MCHC: 31.8 g/dL (ref 30.0–36.0)
MCV: 92.2 fL (ref 80.0–100.0)
Monocytes Absolute: 1 10*3/uL (ref 0.1–1.0)
Monocytes Relative: 9 %
Neutro Abs: 9 10*3/uL — ABNORMAL HIGH (ref 1.7–7.7)
Neutrophils Relative %: 78 %
Platelets: 217 10*3/uL (ref 150–400)
RBC: 3.21 MIL/uL — ABNORMAL LOW (ref 4.22–5.81)
RDW: 14.3 % (ref 11.5–15.5)
WBC: 11.5 10*3/uL — ABNORMAL HIGH (ref 4.0–10.5)
nRBC: 0 % (ref 0.0–0.2)

## 2020-03-23 LAB — COMPREHENSIVE METABOLIC PANEL
ALT: 31 U/L (ref 0–44)
AST: 97 U/L — ABNORMAL HIGH (ref 15–41)
Albumin: 3.2 g/dL — ABNORMAL LOW (ref 3.5–5.0)
Alkaline Phosphatase: 63 U/L (ref 38–126)
Anion gap: 10 (ref 5–15)
BUN: 42 mg/dL — ABNORMAL HIGH (ref 8–23)
CO2: 29 mmol/L (ref 22–32)
Calcium: 8.5 mg/dL — ABNORMAL LOW (ref 8.9–10.3)
Chloride: 93 mmol/L — ABNORMAL LOW (ref 98–111)
Creatinine, Ser: 1.29 mg/dL — ABNORMAL HIGH (ref 0.61–1.24)
GFR calc Af Amer: 59 mL/min — ABNORMAL LOW (ref >60)
GFR calc non Af Amer: 51 mL/min — ABNORMAL LOW (ref >60)
Glucose, Bld: 453 mg/dL — ABNORMAL HIGH (ref 70–99)
Potassium: 4.9 mmol/L (ref 3.5–5.1)
Sodium: 132 mmol/L — ABNORMAL LOW (ref 135–145)
Total Bilirubin: 0.4 mg/dL (ref 0.3–1.2)
Total Protein: 6.3 g/dL — ABNORMAL LOW (ref 6.5–8.1)

## 2020-03-23 LAB — RAPID URINE DRUG SCREEN, HOSP PERFORMED
Amphetamines: NOT DETECTED
Barbiturates: NOT DETECTED
Benzodiazepines: NOT DETECTED
Cocaine: NOT DETECTED
Opiates: NOT DETECTED
Tetrahydrocannabinol: NOT DETECTED

## 2020-03-23 LAB — CBG MONITORING, ED
Glucose-Capillary: 411 mg/dL — ABNORMAL HIGH (ref 70–99)
Glucose-Capillary: 167 mg/dL — ABNORMAL HIGH (ref 70–99)

## 2020-03-23 LAB — BRAIN NATRIURETIC PEPTIDE: B Natriuretic Peptide: 298 pg/mL — ABNORMAL HIGH (ref 0.0–100.0)

## 2020-03-23 LAB — POC OCCULT BLOOD, ED: Fecal Occult Bld: NEGATIVE

## 2020-03-23 MED ORDER — ACETAMINOPHEN 650 MG RE SUPP: 650.0000 mg | Freq: Four times a day (QID) | RECTAL | Status: DC | PRN

## 2020-03-23 MED ORDER — ONDANSETRON HCL 4 MG PO TABS: 4.0000 mg | ORAL_TABLET | Freq: Four times a day (QID) | ORAL | Status: DC | PRN

## 2020-03-23 MED ORDER — AEROCHAMBER Z-STAT PLUS/MEDIUM MISC
1.0000 | Freq: Once | Status: DC
  Filled 2020-03-23: qty 1

## 2020-03-23 MED ORDER — ACETAMINOPHEN 325 MG PO TABS
650.0000 mg | ORAL_TABLET | Freq: Four times a day (QID) | ORAL | Status: DC | PRN
  Administered 2020-03-24: 650 mg via ORAL
  Filled 2020-03-23: qty 2

## 2020-03-23 MED ORDER — INSULIN ASPART 100 UNIT/ML ~~LOC~~ SOLN
0.0000 [IU] | Freq: Three times a day (TID) | SUBCUTANEOUS | Status: DC
  Administered 2020-03-24 (×2): 1 [IU] via SUBCUTANEOUS
  Administered 2020-03-24: 2 [IU] via SUBCUTANEOUS
  Administered 2020-03-25: 1 [IU] via SUBCUTANEOUS
  Filled 2020-03-23 (×2): qty 1

## 2020-03-23 MED ORDER — INSULIN ASPART 100 UNIT/ML ~~LOC~~ SOLN
8.0000 [IU] | Freq: Once | SUBCUTANEOUS | Status: AC
  Administered 2020-03-23: 8 [IU] via SUBCUTANEOUS
  Filled 2020-03-23: qty 1

## 2020-03-23 MED ORDER — ONDANSETRON HCL 4 MG/2ML IJ SOLN: 4.0000 mg | Freq: Four times a day (QID) | INTRAMUSCULAR | Status: DC | PRN

## 2020-03-23 MED ORDER — ALBUTEROL SULFATE HFA 108 (90 BASE) MCG/ACT IN AERS: 2.0000 | INHALATION_SPRAY | RESPIRATORY_TRACT | Status: DC | PRN

## 2020-03-23 NOTE — ED Provider Notes (Signed)
[facility] EMERGENCY DEPARTMENT Provider Note   CSN: [id number] Arrival date & time: 03/23/20  1640     History Chief Complaint  Patient presents with   Hyperglycemia   Weakness    Edward [name] is a 85 y.o. male with a history as outlined below, most significant for COPD, diabetes, hypertension atrial fibrillation on Eliquis and CHF presenting from home for further evaluation of generalized weakness and confusion.  Per son [name] who lives with his father, patient has been having increasing weakness and confusion which started approximately 2 weeks ago.  The son states that he has been sleeping more than normal and has progressed to the point where he has great difficulty ambulating without assistance.  This afternoon he was trying to walk his father back to his bed when he stumbled and fell on his knees and had to be lifted into the bed.  His blood glucose levels have been elevated over 400 consistently.  The son states he continues to drop drinking glasses due to weakness in his hands.  He has had no fevers, no complaints of nausea or vomiting.  Also denies chest pain or shortness of breath.  He does use oxygen chronically at home.  HPI     Past Medical History:  Diagnosis Date   Allergy    Rhinitis   Atrial fibrillation ([name])    Bronchitis    Chronic respiratory failure ([name])    Colon polyps    COPD (chronic obstructive pulmonary disease) ([place])    Diabetes mellitus    Elevated lipids    Hypercholesterolemia    Hypertension    Iron deficiency anemia due to chronic blood loss 04/30/2018   Noncompliance    On home O2    2L N/C    PSA elevation    Pulmonary fibrosis ([name])    Vitamin D deficiency     Patient Active Problem List   Diagnosis Date Noted   Altered mental status 03/23/2020   Normocytic anemia 03/23/2020   Mixed hyperlipidemia 03/06/2019   Parotitis 11/01/2018   Syncope 10/30/2018   Facial cellulitis 10/30/2018   Chronic respiratory  failure with hypoxia ([place]) 10/30/2018   Chronic diastolic heart failure ([name])    Hypoxia    Atrial fibrillation ([place])    COPD with acute exacerbation ([place]) 07/31/2018   Hyponatremia 07/31/2018   COPD (chronic obstructive pulmonary disease) ([place]) 05/21/2018   Stage 3 chronic kidney disease 17/91/5056   Diastolic congestive heart failure ([place]) 05/21/2018   Type 2 diabetes mellitus ([place]) 05/21/2018   HTN (hypertension) 05/21/2018   Iron deficiency anemia due to chronic blood loss 04/30/2018   Respiratory distress    Hematochezia    Goals of care, counseling/discussion    Palliative care by specialist    DNR (do not resuscitate) discussion    Acute renal failure with acute tubular necrosis superimposed on stage 3 chronic kidney disease ([place])    Palliative care encounter    COPD exacerbation ([place]) 04/17/2018   Acute on chronic diastolic CHF (congestive heart failure) ([place])    Severe anemia 02/17/2018   Bradycardia 02/17/2018   Hypothermia    Gastroesophageal reflux disease    Acute encephalopathy 09/25/2017   On home O2 09/25/2017   Bilateral lower extremity edema 06/27/2017   Insomnia 03/28/2017   CKD (chronic kidney disease), stage III 03/07/2017   AF (paroxysmal atrial fibrillation) ([name]) 03/05/2017   Chronic diastolic CHF (congestive heart failure) ([place]) 03/04/2017   HCAP (healthcare-associated pneumonia) 03/04/2017   Sepsis  due to pneumonia ([place]) 02/25/2017   Constipation 02/25/2017   Overflow diarrhea/Constipation 02/25/2017   DM type 2 causing vascular disease ([place]) 10/11/2016   Non compliance w medication regimen 12/02/2015   Hypercholesterolemia 05/12/2014   Acute on chronic respiratory failure with hypoxemia ([place]) 11/17/2013   Elevated PSA 01/20/2013   Hypertension    Elevated lipids    Pulmonary fibrosis ([place])    Colon polyps    Colon polyps     Past Surgical History:  Procedure Laterality Date   BIOPSY  04/23/2018    Procedure: BIOPSY;  Surgeon: Danie Binder, MD;  Location: [facility] ENDO SUITE;  Service: Endoscopy;;  duodenum gastric   CATARACT EXTRACTION W/PHACO  06/25/2012   Procedure: CATARACT EXTRACTION PHACO AND INTRAOCULAR LENS PLACEMENT ([place]);  Surgeon: Elta Guadeloupe T. Gershon Crane, MD;  Location: [facility] ORS;  Service: Ophthalmology;  Laterality: Left;  CDE=19.01   CATARACT EXTRACTION W/PHACO  07/09/2012   Procedure: CATARACT EXTRACTION PHACO AND INTRAOCULAR LENS PLACEMENT (IOC);  Surgeon: Elta Guadeloupe T. Gershon Crane, MD;  Location: [facility] ORS;  Service: Ophthalmology;  Laterality: Right;  CDE: 20.09   COLONOSCOPY WITH PROPOFOL N/A 04/23/2018   Procedure: COLONOSCOPY WITH PROPOFOL;  Surgeon: Danie Binder, MD;  Location: [facility] END[facility] SU[facility];  Service: Endoscopy;  Laterality: N/A;   ESOPHAGOGASTRODUODENOSCOPY (EGD) WITH PROPOFOL N/A 04/23/2018   Procedure: ESOPHAGOGASTRODUODENOSCOPY (EGD) WITH PROPOFOL;  Surgeon: Danie Binder, MD;  Location: [facility] END[facility] SU[facility];  Service: Endoscopy;  Laterality: N/A;       Family History  Problem Relation Age of Onset   Heart disease Mother    CAD Other    Diabetes Other     Social History   Tobacco Use   Smoking status: Former Smoker    Packs/day: 1.50    Years: 60.00    Pack years: 90.00    Types: Cigarettes    Quit date: 12/31/2012    Years since quitting: 7.2   Smokeless tobacco: Never Used  Vaping Use   Vaping Use: Never used  Substance Use Topics   Alcohol use: No   Drug use: No    Home Medications Prior to Admission medications   Medication Sig Start Date End Date Taking? Authorizing Provider  albuterol (PROVENTIL) (2.5 MG/3ML) 0.083% nebulizer solution INHALE 1 VIAL VIA NEBULIZER EVERY 6 HOURS AS NEEDED FOR WHEEZING OR SHORTNESS OF BREATH 08/12/19   Susy Frizzle, MD  budesonide-formoterol [facility]) 160-4.5 MCG/ACT inhaler Inhale 2 puffs into the lungs 2 (two) times daily. 01/13/20   Susy Frizzle, MD  cloNIDine (CATAPRES) 0.1 MG tablet TAKE 1 TABLET BY MOUTH 2  TIMES A DAY 01/29/20   Susy Frizzle, MD  ELIQUIS 5 MG TABS tablet Take 1 tablet by mouth twice daily 03/05/20   Susy Frizzle, MD  furosemide (LASIX) 40 MG tablet TAKE 1 TABLET BY MOUTH DAILY 02/23/20   Susy Frizzle, MD  gabapentin (NEURONTIN) 300 MG capsule Take 1 capsule (300 mg total) by mouth 3 (three) times daily. 02/13/20   Susy Frizzle, MD  HYDROcodone-acetaminophen (NORCO/VICODIN) 5-325 MG tablet Take 1 tablet by mouth every 6 (six) hours as needed for moderate pain. 02/19/20   Fransico Meadow, PA-C  Insulin Glargine (BASAGLAR KWIKPEN) 100 UNIT/ML SOPN Inject 0.3 mLs (30 Units total) into the skin at bedtime. 07/21/19   Susy Frizzle, MD  Insulin Pen Needle (PEN NEEDLES) 31G X 6 MM MISC Use with insulin qd 10/13/19   Susy Frizzle, MD  ipratropium-albuterol (DUONEB) 0.5-2.5 (3) MG/3ML SOLN  Take 3 mLs by nebulization every 6 (six) hours as needed. 12/30/19   Susy Frizzle, MD  Lancets (ONETOUCH DELICA PLUS [id number]) MISC USE TO CHECK BLOOD SUGAR TWICE DAILY AS DIRECTED 01/30/19   Susy Frizzle, MD  LORazepam (ATIVAN) 0.5 MG tablet TAKE 1 TABLET BY MOUTH ONCE DAILY AT BEDTIME AS NEEDED FOR SLEEP 10/27/19   Susy Frizzle, MD  metFORMIN (GLUCOPHAGE) 1000 MG tablet Take 1 tablet (1,000 mg total) by mouth 2 (two) times daily with a meal. 02/06/20   Susy Frizzle, MD  metoprolol tartrate (LOPRESSOR) 25 MG tablet TAKE 1 TABLET BY MOUTH TWICE DAILY 01/30/20   Susy Frizzle, MD  [facility] ULTRA test strip CHECK FASTING BLOOD SUGAR TWICE DAILY 02/19/20   Susy Frizzle, MD  oxyCODONE-acetaminophen (PERCOCET) 7.5-325 MG tablet Take 1 tablet by mouth every 4 (four) hours as needed for severe pain. 03/16/20   Susy Frizzle, MD  OXYGEN Inhale 3 L into the lungs continuous.     [provider]  potassium chloride SA (KLOR-CON) 20 MEQ tablet TAKE 1 TABLET BY MOUTH DAILY WITH LASIX 07/11/19   Susy Frizzle, MD  pravastatin (PRAVACHOL) 80 MG tablet TAKE 1  TABLET BY MOUTH AT BEDTIME 11/17/19   Susy Frizzle, MD  pregabalin (LYRICA) 150 MG capsule Take 1 capsule (150 mg total) by mouth 2 (two) times daily. 03/18/20   Susy Frizzle, MD    Allergies    Ace inhibitors  Review of Systems   Review of Systems  Constitutional: Positive for fatigue. Negative for chills and fever.  HENT: Negative for congestion and sore throat.   Eyes: Negative.   Respiratory: Positive for wheezing. Negative for chest tightness and shortness of breath.   Cardiovascular: Negative for chest pain.  Gastrointestinal: Negative for abdominal pain, nausea and vomiting.  Genitourinary: Negative.   Musculoskeletal: Negative for arthralgias, joint swelling and neck pain.  Skin: Negative.  Negative for rash and wound.  Neurological: Positive for weakness. Negative for dizziness, light-headedness, numbness and headaches.  Psychiatric/Behavioral: Positive for confusion.    Physical Exam Updated Vital Signs BP 131/64    Pulse 68    Temp 98.5 F (36.9 C) (Oral)    Resp 20    Ht 5\' 5"  (1.651 m)    Wt 74.8 kg    SpO2 99%    BMI 27.46 kg/m   Physical Exam Vitals and nursing note reviewed.  Constitutional:      Appearance: He is well-developed. He is not toxic-appearing.     Comments: Drowsy but easily aroused  HENT:     Head: Normocephalic and atraumatic.     Mouth/Throat:     Mouth: Mucous membranes are dry.  Eyes:     Conjunctiva/sclera: Conjunctivae normal.  Cardiovascular:     Rate and Rhythm: Normal rate and regular rhythm.     Heart sounds: Normal heart sounds.  Pulmonary:     Effort: Pulmonary effort is normal.     Breath sounds: Wheezing and rales present.     Comments: Expiratory wheeze with prolonged expirations.  Patient is on 2 L nasal cannula. Abdominal:     General: Bowel sounds are normal. There is distension.     Palpations: Abdomen is soft.     Tenderness: There is no abdominal tenderness. There is no guarding or rebound.  Musculoskeletal:         General: Normal range of motion.     Cervical back: Normal range of motion.  Right lower leg: Edema present.     Left lower leg: Edema present.  Skin:    General: Skin is warm and dry.  Neurological:     General: No focal deficit present.     Mental Status: He is easily aroused. He is disoriented.     Motor: Weakness present.     Comments: Patient is oriented to person and place.  He knows it is June.  Thought it was 2018.     ED Results / Procedures / Treatments   Labs (all labs ordered are listed, but only abnormal results are displayed) Labs Reviewed  CBC WITH DIFFERENTIAL/PLATELET - Abnormal; Notable for the following components:      Result Value   WBC 11.5 (*)    RBC 3.21 (*)    Hemoglobin 9.4 (*)    HCT 29.6 (*)    Neutro Abs 9.0 (*)    All other components within normal limits  COMPREHENSIVE METABOLIC PANEL - Abnormal; Notable for the following components:   Sodium 132 (*)    Chloride 93 (*)    Glucose, Bld 453 (*)    BUN 42 (*)    Creatinine, Ser 1.29 (*)    Calcium 8.5 (*)    Total Protein 6.3 (*)    Albumin 3.2 (*)    AST 97 (*)    GFR calc non Af Amer 51 (*)    GFR calc Af Amer 59 (*)    All other components within normal limits  BRAIN NATRIURETIC PEPTIDE - Abnormal; Notable for the following components:   B Natriuretic Peptide 298.0 (*)    All other components within normal limits  URINALYSIS, ROUTINE W REFLEX MICROSCOPIC - Abnormal; Notable for the following components:   Glucose, UA >=500 (*)    Hgb urine dipstick SMALL (*)    Protein, ur >=300 (*)    All other components within normal limits  CBG MONITORING, ED - Abnormal; Notable for the following components:   Glucose-Capillary 411 (*)    All other components within normal limits  CBG MONITORING, ED - Abnormal; Notable for the following components:   Glucose-Capillary 167 (*)    All other components within normal limits  SARS CORONAVIRUS 2 BY RT PCR (HOSPITAL ORDER, [facility])  RAPID URINE DRUG SCREEN, HOSP PERFORMED  COMPREHENSIVE METABOLIC PANEL  CBC  HEMOGLOBIN A1C  POC OCCULT BLOOD, ED    EKG EKG Interpretation  Date/Time:  Tuesday March 23 2020 17:11:39 EDT Ventricular Rate:  80 PR Interval:    QRS Duration: 88 QT Interval:  393 QTC Calculation: 454 R Axis:   61 Text Interpretation: Sinus rhythm No STEMI Confirmed by Nanda Quinton [phone]) on 03/23/2020 5:58:44 PM   Radiology CT Head Wo Contrast  Result Date: 03/23/2020 CLINICAL DATA:  85 year old male with neurologic deficit. EXAM: CT HEAD WITHOUT CONTRAST TECHNIQUE: Contiguous axial images were obtained from the base of the skull through the vertex without intravenous contrast. COMPARISON:  Head CT dated 01/19/2020. FINDINGS: Brain: Mild age-related atrophy and chronic microvascular ischemic changes. There is no acute intracranial hemorrhage. No mass effect or midline shift. No extra-axial fluid collection. Vascular: No hyperdense vessel or unexpected calcification. Skull: Normal. Negative for fracture or focal lesion. Sinuses/Orbits: Mild mucoperiosteal thickening of paranasal sinuses. No air-fluid level. The mastoid air cells are clear. Other: None IMPRESSION: 1. No acute intracranial pathology. 2. Mild age-related atrophy and chronic microvascular ischemic changes. Electronically Signed   By: Laren Everts.D.  On: 03/23/2020 19:31   DG Chest Port 1 View  Result Date: 03/23/2020 CLINICAL DATA:  85 year old male with wheezing. EXAM: PORTABLE CHEST 1 VIEW COMPARISON:  Chest radiograph dated 01/19/2020. FINDINGS: Mild diffuse interstitial prominence and nodularity, chronic. No new consolidative changes. There is no pleural effusion pneumothorax. Stable cardiac silhouette. Atherosclerotic calcification of the aorta. No acute osseous pathology. IMPRESSION: No interval change.  No new consolidation. Electronically Signed   By: Anner Crete M.D.   On: 03/23/2020 18:49     Procedures Procedures (including critical care time)  Medications Ordered in ED Medications  albuterol (VENTOLIN HFA) 108 (90 Base) MCG/ACT inhaler 2 puff (has no administration in time range)  aerochamber Z-Stat Plus/medium 1 each (1 each Other Not Given 03/23/20 1840)  ondansetron (ZOFRAN) tablet 4 mg (has no administration in time range)    Or  ondansetron (ZOFRAN) injection 4 mg (has no administration in time range)  acetaminophen (TYLENOL) tablet 650 mg (has no administration in time range)    Or  acetaminophen (TYLENOL) suppository 650 mg (has no administration in time range)  insulin aspart (novoLOG) injection 0-9 Units (has no administration in time range)  insulin aspart (novoLOG) injection 8 Units (8 Units Subcutaneous Given 03/23/20 1833)    ED Course  I have reviewed the triage vital signs and the nursing notes.  Pertinent labs & imaging results that were available during my care of the patient were reviewed by me and considered in my medical decision making (see chart for details).    MDM Rules/Calculators/A&P                          Pt with increasing weakness, confusion/AMS with hyperglycemia without dka. BUN and creatinine elevated, but exam findings suggesting chf exacerbation.    Wheezing baseline per pt, refused albuterol tx.  AMS of unclear etiology, disoriented to time.  CT head negative for acute findings.    Discussed with Dr. Olevia Bowens who accepts pt for admission - AMS Final Clinical Impression(s) / ED Diagnoses Final diagnoses:  Disorientation  Hyperglycemia  Weakness    Rx / DC Orders ED Discharge Orders    None       Landis Martins 03/23/20 2327    Margette Fast, MD 03/24/20 [phone]

## 2020-03-23 NOTE — ED Triage Notes (Signed)
Pt from home. C/o of weakness and hyperglycemia x 2 weeks

## 2020-03-23 NOTE — H&P (Signed)
History and Physical    Edward [name]:[id number] DOB: Feb 16, 1935 DOA: 03/23/2020  PCP: Susy Frizzle, MD  Patient coming from: Home.  I have personally briefly reviewed patient's old medical records in [place]  Chief Complaint: Altered mental status.  HPI: Edward [name] is a 85 y.o. male with medical history significant of allergy, PAF (atrial fibrillation), pulmonary fibrosis, COPD on home oxygen, type II diabetes mellitus, hyperlipidemia, hypertension, iron deficiency anemia, history of noncompliance, PSA elevation, vitamin D deficiency who was brought to the emergency department due to progressively worse weakness, confusion and hyperglycemia for the past 2 weeks associated with worsening mental status earlier today.  His son also stated he has been sleeping a lot more than usual.  He had a fall earlier in the afternoon after stumbling and falling in his needs.  He was unable to stand up after that and had to be lifted to his bed.  There has not been any fevers, nausea, emesis, diarrhea or complaints of pain.  When seen, the patient is able to answer simple questions and denies headache, chest pain or abdominal pain.  He stated he was not hungry.  ED Course: Initial vital signs were temperature 98.5 F, pulse 78, BP 123/65 mmHg and and O2 sat 87% on room air.  Most recently his O2 sat is at 100% with nasal cannula oxygen at 2 LPM.  He was given 8 units of insulin in the emergency department.  Urinalysis shows glucosuria more than 500 and proteinuria more than 300 mg/dL.  There is small hemoglobinuria.  Microscopic urine examination is unremarkable.  UDS is negative.  Fecal occult blood was negative.  CBC showed a white count of 11.5, hemoglobin 9.4 g/dL and platelets 217.  His CMP shows a sodium 132 and chloride 93 mmol/L, all other electrolytes are within normal range with calcium is corrected to albumin.  Glucose 453, BUN 42 and creatinine 1.29 mg/dL.  Total protein 6.3 and  albumin 3.2 g/dL.  AST was 97, ALT, alk phos and bilirubin are within normal range.  BNP 298.0 pg/mL.  Imaging: Chest radiograph did not show any acute cardiopulmonary pathology.  CT head did not show any acute intracranial pathology.  Please see images imported report for further detail.  Review of Systems: As per HPI otherwise all other systems reviewed and are negative.  Past Medical History:  Diagnosis Date  . Allergy    Rhinitis  . Atrial fibrillation ([place])   . Bronchitis   . Chronic respiratory failure ([place])   . Colon polyps   . COPD (chronic obstructive pulmonary disease) ([place])   . Diabetes mellitus   . Elevated lipids   . Hypercholesterolemia   . Hypertension   . Iron deficiency anemia due to chronic blood loss 04/30/2018  . Noncompliance   . On home O2    2L N/C   . PSA elevation   . Pulmonary fibrosis ([place])   . Vitamin D deficiency    Past Surgical History:  Procedure Laterality Date  . BIOPSY  04/23/2018   Procedure: BIOPSY;  Surgeon: Danie Binder, MD;  Location: [facility];  Service: Endoscopy;;  duodenum gastric  . CATARACT EXTRACTION W/PHACO  06/25/2012   Procedure: CATARACT EXTRACTION PHACO AND INTRAOCULAR LENS PLACEMENT (IOC);  Surgeon: Elta Guadeloupe T. Gershon Crane, MD;  Location: [facility];  Service: Ophthalmology;  Laterality: Left;  CDE=19.01  . CATARACT EXTRACTION W/PHACO  07/09/2012   Procedure: CATARACT EXTRACTION PHACO AND INTRAOCULAR LENS PLACEMENT (IOC);  Surgeon: Loraine Leriche T. Nile Riggs, MD;  Location: [facility];  Service: Ophthalmology;  Laterality: Right;  CDE: 20.09  . COLONOSCOPY WITH PROPOFOL N/A 04/23/2018   Procedure: COLONOSCOPY WITH PROPOFOL;  Surgeon: West Bali, MD;  Location: [facility];  Service: Endoscopy;  Laterality: N/A;  . ESOPHAGOGASTRODUODENOSCOPY (EGD) WITH PROPOFOL N/A 04/23/2018   Procedure: ESOPHAGOGASTRODUODENOSCOPY (EGD) WITH PROPOFOL;  Surgeon: West Bali, MD;  Location: [facility];  Service: Endoscopy;  Laterality: N/A;   Social  History  reports that he quit smoking about 7 years ago. His smoking use included cigarettes. He has a 90.00 pack-year smoking history. He has never used smokeless tobacco. He reports that he does not drink alcohol and does not use drugs.  Allergies  Allergen Reactions  . Ace Inhibitors Other (See Comments)    Hyperkalemia--07/23/2013:patient states not familiar with the following allergy   Family History  Problem Relation Age of Onset  . Heart disease Mother   . CAD Other   . Diabetes Other    Prior to Admission medications   Medication Sig Start Date End Date Taking? Authorizing Provider  albuterol (PROVENTIL) (2.5 MG/3ML) 0.083% nebulizer solution INHALE 1 VIAL VIA NEBULIZER EVERY 6 HOURS AS NEEDED FOR WHEEZING OR SHORTNESS OF BREATH 08/12/19   Donita Brooks, MD  budesonide-formoterol [facility]) 160-4.5 MCG/ACT inhaler Inhale 2 puffs into the lungs 2 (two) times daily. 01/13/20   Donita Brooks, MD  cloNIDine (CATAPRES) 0.1 MG tablet TAKE 1 TABLET BY MOUTH 2 TIMES A DAY 01/29/20   Donita Brooks, MD  ELIQUIS 5 MG TABS tablet Take 1 tablet by mouth twice daily 03/05/20   Donita Brooks, MD  furosemide (LASIX) 40 MG tablet TAKE 1 TABLET BY MOUTH DAILY 02/23/20   Donita Brooks, MD  gabapentin (NEURONTIN) 300 MG capsule Take 1 capsule (300 mg total) by mouth 3 (three) times daily. 02/13/20   Donita Brooks, MD  HYDROcodone-acetaminophen (NORCO/VICODIN) 5-325 MG tablet Take 1 tablet by mouth every 6 (six) hours as needed for moderate pain. 02/19/20   Elson Areas, PA-C  Insulin Glargine (BASAGLAR KWIKPEN) 100 UNIT/ML SOPN Inject 0.3 mLs (30 Units total) into the skin at bedtime. 07/21/19   Donita Brooks, MD  Insulin Pen Needle (PEN NEEDLES) 31G X 6 MM MISC Use with insulin qd 10/13/19   Donita Brooks, MD  ipratropium-albuterol (DUONEB) 0.5-2.5 (3) MG/3ML SOLN Take 3 mLs by nebulization every 6 (six) hours as needed. 12/30/19   Donita Brooks, MD  Lancets (ONETOUCH  DELICA PLUS LANCET33G) MISC USE TO CHECK BLOOD SUGAR TWICE DAILY AS DIRECTED 01/30/19   Donita Brooks, MD  LORazepam (ATIVAN) 0.5 MG tablet TAKE 1 TABLET BY MOUTH ONCE DAILY AT BEDTIME AS NEEDED FOR SLEEP 10/27/19   Donita Brooks, MD  metFORMIN (GLUCOPHAGE) 1000 MG tablet Take 1 tablet (1,000 mg total) by mouth 2 (two) times daily with a meal. 02/06/20   Donita Brooks, MD  metoprolol tartrate (LOPRESSOR) 25 MG tablet TAKE 1 TABLET BY MOUTH TWICE DAILY 01/30/20   Donita Brooks, MD  [facility] ULTRA test strip CHECK FASTING BLOOD SUGAR TWICE DAILY 02/19/20   Donita Brooks, MD  oxyCODONE-acetaminophen (PERCOCET) 7.5-325 MG tablet Take 1 tablet by mouth every 4 (four) hours as needed for severe pain. 03/16/20   Donita Brooks, MD  OXYGEN Inhale 3 L into the lungs continuous.     [provider]  potassium chloride SA (KLOR-CON) 20 MEQ tablet  TAKE 1 TABLET BY MOUTH DAILY WITH LASIX 07/11/19   Susy Frizzle, MD  pravastatin (PRAVACHOL) 80 MG tablet TAKE 1 TABLET BY MOUTH AT BEDTIME 11/17/19   Susy Frizzle, MD  pregabalin (LYRICA) 150 MG capsule Take 1 capsule (150 mg total) by mouth 2 (two) times daily. 03/18/20   Susy Frizzle, MD   Physical Exam: Vitals:   03/23/20 2130 03/23/20 2200 03/23/20 2230 03/23/20 2235  BP: 111/64 119/69 131/64 131/64  Pulse:    68  Resp: 15 20 (!) 21 20  Temp:      TempSrc:      SpO2:    99%  Weight:      Height:       Constitutional: NAD, calm, comfortable Eyes: PERRL, lids and conjunctivae mildly injected. ENMT: Mucous membranes are moist. Posterior pharynx clear of any exudate or lesions. Neck: normal, supple, no masses, no thyromegaly Respiratory: clear to auscultation bilaterally, no wheezing, no crackles. Normal respiratory effort. No accessory muscle use.  Cardiovascular: Regular rate and rhythm, no murmurs / rubs / gallops. No extremity edema. 2+ pedal pulses. No carotid bruits.  Abdomen: Nondistended.  BS positive.  Soft,  no tenderness, no masses palpated. No hepatosplenomegaly.  Musculoskeletal: Generalized weakness.  No clubbing / cyanosis.  Good ROM, no contractures. Normal muscle tone.  Skin: Some small areas of ecchymosis on extremities. Neurologic: CN 2-12 grossly intact. Sensation intact, DTR normal.  Nonfocal generalized weakness.Marland Kitchen  Psychiatric: Alert and oriented x 2, partially oriented to time and situation. Normal mood.   Labs on Admission: I have personally reviewed following labs and imaging studies  CBC: Recent Labs  Lab 03/23/20 1705  WBC 11.5*  NEUTROABS 9.0*  HGB 9.4*  HCT 29.6*  MCV 92.2  PLT 782   Basic Metabolic Panel: Recent Labs  Lab 03/23/20 1705  NA 132*  K 4.9  CL 93*  CO2 29  GLUCOSE 453*  BUN 42*  CREATININE 1.29*  CALCIUM 8.5*   GFR: Estimated Creatinine Clearance: 41.7 mL/min (A) (by C-G formula based on SCr of 1.29 mg/dL (H)).  Liver Function Tests: Recent Labs  Lab 03/23/20 1705  AST 97*  ALT 31  ALKPHOS 63  BILITOT 0.4  PROT 6.3*  ALBUMIN 3.2*   Urine analysis:    Component Value Date/Time   COLORURINE YELLOW 03/23/2020 1815   APPEARANCEUR CLEAR 03/23/2020 1815   LABSPEC 1.015 03/23/2020 1815   PHURINE 7.0 03/23/2020 1815   GLUCOSEU >=500 (A) 03/23/2020 1815   HGBUR SMALL (A) 03/23/2020 1815   BILIRUBINUR NEGATIVE 03/23/2020 1815   KETONESUR NEGATIVE 03/23/2020 1815   PROTEINUR >=300 (A) 03/23/2020 1815   NITRITE NEGATIVE 03/23/2020 1815   LEUKOCYTESUR NEGATIVE 03/23/2020 1815   Radiological Exams on Admission: CT Head Wo Contrast  Result Date: 03/23/2020 CLINICAL DATA:  85 year old male with neurologic deficit. EXAM: CT HEAD WITHOUT CONTRAST TECHNIQUE: Contiguous axial images were obtained from the base of the skull through the vertex without intravenous contrast. COMPARISON:  Head CT dated 01/19/2020. FINDINGS: Brain: Mild age-related atrophy and chronic microvascular ischemic changes. There is no acute intracranial hemorrhage. No mass  effect or midline shift. No extra-axial fluid collection. Vascular: No hyperdense vessel or unexpected calcification. Skull: Normal. Negative for fracture or focal lesion. Sinuses/Orbits: Mild mucoperiosteal thickening of paranasal sinuses. No air-fluid level. The mastoid air cells are clear. Other: None IMPRESSION: 1. No acute intracranial pathology. 2. Mild age-related atrophy and chronic microvascular ischemic changes. Electronically Signed   By: Laren Everts.D.  On: 03/23/2020 19:31   DG Chest Port 1 View  Result Date: 03/23/2020 CLINICAL DATA:  85 year old male with wheezing. EXAM: PORTABLE CHEST 1 VIEW COMPARISON:  Chest radiograph dated 01/19/2020. FINDINGS: Mild diffuse interstitial prominence and nodularity, chronic. No new consolidative changes. There is no pleural effusion pneumothorax. Stable cardiac silhouette. Atherosclerotic calcification of the aorta. No acute osseous pathology. IMPRESSION: No interval change.  No new consolidation. Electronically Signed   By: Anner Crete M.D.   On: 03/23/2020 18:49    EKG: Independently reviewed.  Vent. rate 80 BPM PR interval * ms QRS duration 88 ms QT/QTc 393/454 ms P-R-T axes 72 61 60 Sinus rhythm  Assessment/Plan Principal Problem:   Altered mental status Seems to be improving after earlier treatment. Place in observation/telemetry. Neurochecks every 4 hours. Treat hyperglycemia. Treat COPD and hypoxia. May need PT evaluation.  Active Problems:   DM type 2 causing vascular disease ([name]) Carbohydrate modified diet CBG monitoring with RI SS. Continue Lantus 30 units SQ at bedtime.    AF (paroxysmal atrial fibrillation) ([name]) CHA?DS?-VASc Score of at least 6. Continue Eliquis 5 mg p.o. twice daily. On metoprolol for rate control.    COPD (chronic obstructive pulmonary disease) ([name]) Continue supplemental oxygen. Bronchodilators as needed.    Chronic diastolic heart failure ([name]) Continue metoprolol 25 mg p.o.  daily. Continue furosemide 40 mg p.o. daily.    Hypertension On metoprolol and furosemide. Continue clonidine 0.1 mg p.o. twice daily. May benefit from beta-blocker dose increase and clonidine taper off. Monitor blood pressure, heart rate, creatinine and electrolytes.    Mixed hyperlipidemia On pravastatin 80 mg p.o. at bedtime.    Normocytic anemia Has decreased 2 g since last month. He is on Eliquis. Stool occult blood was negative. Monitor H&H. Transfuse as needed.    DVT prophylaxis: On Eliquis. Code Status:   Full code. Family Communication:   Disposition Plan:   Patient is from:  Home.  Anticipated DC to:  Home.  Anticipated DC date:  03/25/2020.  Anticipated DC barriers: Mental status and overall improvement  Consults called:  TOC team. Admission status:  Observation/telemetry.  Severity of Illness:  Reubin Milan MD [facility]  How to contact the [facility] Attending or Consulting provider [place] or covering provider during after hours [place], for this patient?   1. Check the care team in [facility] and look for a) attending/consulting [facility] provider listed and b) the [facility] team listed 2. Log into [email or web address] and use [place]'s universal password to access. If you do not have the password, please contact the hospital operator. 3. Locate the [facility] provider you are looking for under [facility] and page to a number that you can be directly reached. 4. If you still have difficulty reaching the provider, please page the [facility] (Director on Call) for the Hospitalists listed on amion for assistance.  03/23/2020, 11:17 PM   This document was prepared using Dragon voice recognition software and may contain some unintended transcription errors.

## 2020-03-24 DIAGNOSIS — R531 Weakness: Secondary | ICD-10-CM

## 2020-03-24 DIAGNOSIS — R4182 Altered mental status, unspecified: Secondary | ICD-10-CM | POA: Diagnosis not present

## 2020-03-24 DIAGNOSIS — R739 Hyperglycemia, unspecified: Secondary | ICD-10-CM

## 2020-03-24 DIAGNOSIS — I5032 Chronic diastolic (congestive) heart failure: Secondary | ICD-10-CM | POA: Diagnosis not present

## 2020-03-24 DIAGNOSIS — J441 Chronic obstructive pulmonary disease with (acute) exacerbation: Secondary | ICD-10-CM

## 2020-03-24 DIAGNOSIS — R410 Disorientation, unspecified: Secondary | ICD-10-CM | POA: Diagnosis not present

## 2020-03-24 DIAGNOSIS — I480 Paroxysmal atrial fibrillation: Secondary | ICD-10-CM

## 2020-03-24 DIAGNOSIS — I10 Essential (primary) hypertension: Secondary | ICD-10-CM

## 2020-03-24 DIAGNOSIS — E782 Mixed hyperlipidemia: Secondary | ICD-10-CM

## 2020-03-24 LAB — CBC
HCT: 30.2 % — ABNORMAL LOW (ref 39.0–52.0)
Hemoglobin: 9.4 g/dL — ABNORMAL LOW (ref 13.0–17.0)
MCH: 29.1 pg (ref 26.0–34.0)
MCHC: 31.1 g/dL (ref 30.0–36.0)
MCV: 93.5 fL (ref 80.0–100.0)
Platelets: 220 10*3/uL (ref 150–400)
RBC: 3.23 MIL/uL — ABNORMAL LOW (ref 4.22–5.81)
RDW: 14.5 % (ref 11.5–15.5)
WBC: 7.8 10*3/uL (ref 4.0–10.5)
nRBC: 0 % (ref 0.0–0.2)

## 2020-03-24 LAB — COMPREHENSIVE METABOLIC PANEL
ALT: 34 U/L (ref 0–44)
AST: 99 U/L — ABNORMAL HIGH (ref 15–41)
Albumin: 3.1 g/dL — ABNORMAL LOW (ref 3.5–5.0)
Alkaline Phosphatase: 58 U/L (ref 38–126)
Anion gap: 10 (ref 5–15)
BUN: 43 mg/dL — ABNORMAL HIGH (ref 8–23)
CO2: 30 mmol/L (ref 22–32)
Calcium: 8.7 mg/dL — ABNORMAL LOW (ref 8.9–10.3)
Chloride: 95 mmol/L — ABNORMAL LOW (ref 98–111)
Creatinine, Ser: 1.16 mg/dL (ref 0.61–1.24)
GFR calc Af Amer: >60 mL/min (ref >60)
GFR calc non Af Amer: 58 mL/min — ABNORMAL LOW (ref >60)
Glucose, Bld: 157 mg/dL — ABNORMAL HIGH (ref 70–99)
Potassium: 4.2 mmol/L (ref 3.5–5.1)
Sodium: 135 mmol/L (ref 135–145)
Total Bilirubin: 0.5 mg/dL (ref 0.3–1.2)
Total Protein: 5.9 g/dL — ABNORMAL LOW (ref 6.5–8.1)

## 2020-03-24 LAB — GLUCOSE, CAPILLARY
Glucose-Capillary: 138 mg/dL — ABNORMAL HIGH (ref 70–99)
Glucose-Capillary: 196 mg/dL — ABNORMAL HIGH (ref 70–99)

## 2020-03-24 LAB — HEMOGLOBIN A1C
Hgb A1c MFr Bld: 9.1 % — ABNORMAL HIGH (ref 4.8–5.6)
Mean Plasma Glucose: 214.47 mg/dL

## 2020-03-24 LAB — CBG MONITORING, ED
Glucose-Capillary: 147 mg/dL — ABNORMAL HIGH (ref 70–99)
Glucose-Capillary: 149 mg/dL — ABNORMAL HIGH (ref 70–99)
Glucose-Capillary: 186 mg/dL — ABNORMAL HIGH (ref 70–99)

## 2020-03-24 MED ORDER — INSULIN DETEMIR 100 UNIT/ML ~~LOC~~ SOLN
10.0000 [IU] | Freq: Every day | SUBCUTANEOUS | Status: DC
  Administered 2020-03-24: 10 [IU] via SUBCUTANEOUS
  Filled 2020-03-24 (×2): qty 0.1

## 2020-03-24 MED ORDER — METOPROLOL TARTRATE 25 MG PO TABS
25.0000 mg | ORAL_TABLET | Freq: Two times a day (BID) | ORAL | Status: DC
  Administered 2020-03-24 – 2020-03-25 (×2): 25 mg via ORAL
  Filled 2020-03-24 (×2): qty 1

## 2020-03-24 MED ORDER — APIXABAN 5 MG PO TABS
5.0000 mg | ORAL_TABLET | Freq: Two times a day (BID) | ORAL | Status: DC
  Administered 2020-03-24 – 2020-03-25 (×2): 5 mg via ORAL
  Filled 2020-03-24 (×2): qty 1

## 2020-03-24 MED ORDER — PRAVASTATIN SODIUM 40 MG PO TABS
80.0000 mg | ORAL_TABLET | Freq: Every day | ORAL | Status: DC
  Administered 2020-03-24: 80 mg via ORAL
  Filled 2020-03-24: qty 2

## 2020-03-24 MED ORDER — PREDNISONE 20 MG PO TABS
40.0000 mg | ORAL_TABLET | Freq: Every day | ORAL | Status: DC
  Administered 2020-03-25: 40 mg via ORAL
  Filled 2020-03-24 (×2): qty 2

## 2020-03-24 MED ORDER — IPRATROPIUM-ALBUTEROL 0.5-2.5 (3) MG/3ML IN SOLN: 3.0000 mL | RESPIRATORY_TRACT | Status: DC | PRN

## 2020-03-24 MED ORDER — BUDESONIDE 0.5 MG/2ML IN SUSP
0.5000 mg | Freq: Two times a day (BID) | RESPIRATORY_TRACT | Status: DC
  Administered 2020-03-24 – 2020-03-25 (×3): 0.5 mg via RESPIRATORY_TRACT
  Filled 2020-03-24 (×3): qty 2

## 2020-03-24 MED ORDER — FUROSEMIDE 40 MG PO TABS
40.0000 mg | ORAL_TABLET | Freq: Every day | ORAL | Status: DC
  Administered 2020-03-25: 40 mg via ORAL
  Filled 2020-03-24: qty 1

## 2020-03-24 MED ORDER — CLONIDINE HCL 0.1 MG PO TABS
0.1000 mg | ORAL_TABLET | Freq: Every day | ORAL | Status: DC
  Administered 2020-03-24 – 2020-03-25 (×2): 0.1 mg via ORAL
  Filled 2020-03-24 (×2): qty 1

## 2020-03-24 NOTE — Progress Notes (Signed)
Inpatient Diabetes Program Recommendations  AACE/ADA: New Consensus Statement on Inpatient Glycemic Control (2015)  Target Ranges:  Prepandial:   less than 140 mg/dL      Peak postprandial:   less than 180 mg/dL (1-2 hours)      Critically ill patients:  140 - 180 mg/dL   Results for Edward [name], Edward [name] (MRN [id number]) as of 03/24/2020 11:47  Ref. Range 03/23/2020 17:01 03/23/2020 22:06 03/24/2020 07:43 03/24/2020 08:17 03/24/2020 11:13  Glucose-Capillary Latest Ref Range: 70 - 99 mg/dL 411 (H)  8 units NOVOLOG  167 (H) 149 (H)  1 unit NOVOLOG  147 (H) 186 (H)     To ED with AMS/ Weakness/ Hyperglycemia  History: DM, COPD, Pulm Fibrosis   Home DM Meds: Basaglar 30 units QHS       Metformin 1000 mg BID    Current Orders: Novolog Sensitive Correction Scale/ SSI (0-9 units) TID AC     CBG 411 yest at 5pm on arrival to the ED--given 8 units Novolog     Spoke w/ pt's sone by phone this AM (DM Coordinator working from [place] in [place]).  Son told me pt has his insulin at home and takes his insulin (as far as he knows).  Son checks in on pt and pt's wife per report. Note that Current A1c= 9.1% (down from 11.8% back in April).  This is a slight improvement since April.  Explained to pt's son that we have started insulin and CBGs are improved since last PM.  Explained that we will monitor CBGs frequently and adjust insulin as needed. Will follow while hospitalized.       --Will follow patient during hospitalization--  Wyn Quaker RN, MSN, CDE Diabetes Coordinator Inpatient Glycemic Control Team Team Pager: [phone] (8a-5p)

## 2020-03-24 NOTE — Care Management Obs Status (Signed)
[place] NOTIFICATION   Patient Details  Name: Edward [name] MRN: [id number] Date of Birth: October 28, 1934   Medicare Observation Status Notification Given:  Yes    Tommy Medal 03/24/2020, 4:34 PM

## 2020-03-24 NOTE — Progress Notes (Signed)
PROGRESS NOTE    Edward [name]  WUJ:[id number] DOB: 1935-07-11 DOA: 03/23/2020 PCP: Susy Frizzle, MD (Confirm with patient/family/NH records and if not entered, this HAS to be entered at [facility] point of entry. "No PCP" if truly none.)   Chief Complaint  Patient presents with  . Hyperglycemia  . Weakness    Brief Narrative:  As per H&P written by Dr. Olevia Bowens on 03/23/2020 85 y.o. male with medical history significant of allergy, PAF (atrial fibrillation), pulmonary fibrosis, COPD on home oxygen, type II diabetes mellitus, hyperlipidemia, hypertension, iron deficiency anemia, history of noncompliance, PSA elevation, vitamin D deficiency who was brought to the emergency department due to progressively worse weakness, confusion and hyperglycemia for the past 2 weeks associated with worsening mental status earlier today.  His son also stated he has been sleeping a lot more than usual.  He had a fall earlier in the afternoon after stumbling and falling in his needs.  He was unable to stand up after that and had to be lifted to his bed.  There has not been any fevers, nausea, emesis, diarrhea or complaints of pain.  When seen, the patient is able to answer simple questions and denies headache, chest pain or abdominal pain.  He stated he was not hungry.  ED Course: Initial vital signs were temperature 98.5 F, pulse 78, BP 123/65 mmHg and and O2 sat 87% on room air.  Most recently his O2 sat is at 100% with nasal cannula oxygen at 2 LPM.  He was given 8 units of insulin in the emergency department.  Urinalysis shows glucosuria more than 500 and proteinuria more than 300 mg/dL.  There is small hemoglobinuria.  Microscopic urine examination is unremarkable.  UDS is negative.  Fecal occult blood was negative.  CBC showed a white count of 11.5, hemoglobin 9.4 g/dL and platelets 217.  His CMP shows a sodium 132 and chloride 93 mmol/L, all other electrolytes are within normal range with calcium is corrected  to albumin.  Glucose 453, BUN 42 and creatinine 1.29 mg/dL.  Total protein 6.3 and albumin 3.2 g/dL.  AST was 97, ALT, alk phos and bilirubin are within normal range.  BNP 298.0 pg/mL.  Imaging: Chest radiograph did not show any acute cardiopulmonary pathology.  CT head did not show any acute intracranial pathology.  Please see images imported report for further detail.  Assessment & Plan: 1-Altered mental status -Acute metabolic encephalopathy -Continue fluid resuscitation, minimizing agents that can alter mentation -Continue controlling blood sugar -Continue supportive care. -Imaging studies demonstrating no acute intracranial abnormalities. -PT ordered. -No signs of acute infection.  2-COPD (chronic obstructive pulmonary disease) ([name]) -mild exacerbation appreciated -continue bronchodilators -star prednisone and pulmicort -continue home O2 supplementation  3-Hypertension -stable currently -will follow VS and resume home antihypertensive as needed -received IVF's first  4-DM type 2 causing vascular disease ([place]) -Continue sliding scale insulin and Levemir -Holding oral hypoglycemic agents while inpatient -Follow CBGs. -Patient presented with hyperglycemia and dehydration.  5-AF (paroxysmal atrial fibrillation) ([name]) -resume metoprolol and eliquis -monitor on telemetry  6-Chronic diastolic heart failure ([name]) -stable overall -follow daily weight and strict I's and O's -low sodium diet encouraged -resume lasix in am  7-HLD -continue statins    DVT prophylaxis: on eliquis Code Status: full code. Family Communication: no family at bedside, Son called, but unable to reached over the phone.  Disposition:   Status is: Observation  Dispo: The patient is from: home  Anticipated d/c is to: to be determined.               Anticipated d/c date is: to be determined.              Patient currently no medically stable for discharge in the setting of shortness of  breath, diffuse wheezing, physically weak and deconditioned.  Patient is demonstrating improvement in his orientation after fluid resuscitation given and better stabilization of electrolytes impairment and improvement in sugar control.     Consultants:   None   Procedures:  See below for x-ray reports   Antimicrobials:  None    Subjective: Afebrile; however mild difficulty speaking in full sentences and complaining of shortness of breath with exertion.  Diffuse wheezing appreciated bilaterally.  No nausea, no vomiting.  Oriented x2 currently.  Objective: Vitals:   03/24/20 1158 03/24/20 1258 03/24/20 1626 03/24/20 1626  BP: (!) 156/74 (!) 147/74  (!) 168/75  Pulse: 67 74  61  Resp: '17 20  20  '$ Temp:    97.7 F (36.5 C)  TempSrc:    Oral  SpO2: 100% 100%  100%  Weight:   77.6 kg   Height:   '5\' 5"'$  (1.651 m)     Intake/Output Summary (Last 24 hours) at 03/24/2020 1926 Last data filed at 03/24/2020 1625 Gross per 24 hour  Intake --  Output 350 ml  Net -350 ml   Filed Weights   03/23/20 1704 03/24/20 1626  Weight: 74.8 kg 77.6 kg    Examination: General exam: Appears in no acute distress; reporting feeling weak and deconditioned.  Short of breath with activity and having diffuse wheezing.  He is oriented x2 (person and place) and able to follow commands properly. Respiratory system: No using accessory muscles; mild difficulty speaking in full sentences with associated shortness of breath and bilateral diffuse wheezing.  On  his chronic 2 L nasal cannula supplementation. Cardiovascular system: S1 & S2 heard, RRR. No JVD, rubs or gallops. No pedal edema. Gastrointestinal system: Abdomen is nondistended, soft and nontender. No organomegaly or masses felt. Normal bowel sounds heard. Central nervous system: Alert and oriented. No focal neurological deficits. Extremities: no cyanosis or clubbing. Skin: No rashes, no petechiae. Psychiatry: Mood & affect appropriate.      Data Reviewed: I have personally reviewed following labs and imaging studies  CBC: Recent Labs  Lab 03/23/20 1705 03/24/20 0343  WBC 11.5* 7.8  NEUTROABS 9.0*  --   HGB 9.4* 9.4*  HCT 29.6* 30.2*  MCV 92.2 93.5  PLT 217 101    Basic Metabolic Panel: Recent Labs  Lab 03/23/20 1705 03/24/20 0343  NA 132* 135  K 4.9 4.2  CL 93* 95*  CO2 29 30  GLUCOSE 453* 157*  BUN 42* 43*  CREATININE 1.29* 1.16  CALCIUM 8.5* 8.7*    GFR: Estimated Creatinine Clearance: 47.2 mL/min (by C-G formula based on SCr of 1.16 mg/dL).  Liver Function Tests: Recent Labs  Lab 03/23/20 1705 03/24/20 0343  AST 97* 99*  ALT 31 34  ALKPHOS 63 58  BILITOT 0.4 0.5  PROT 6.3* 5.9*  ALBUMIN 3.2* 3.1*    CBG: Recent Labs  Lab 03/23/20 2206 03/24/20 0743 03/24/20 0817 03/24/20 1113 03/24/20 1623  GLUCAP 167* 149* 147* 186* 138*     Recent Results (from the past 240 hour(s))  SARS Coronavirus 2 by RT PCR (hospital order, performed in [facility] hospital lab) Nasopharyngeal Nasopharyngeal Swab     Status: None  Collection Time: 03/23/20  5:58 PM   Specimen: Nasopharyngeal Swab  Result Value Ref Range Status   SARS Coronavirus 2 NEGATIVE NEGATIVE Final    Comment: (NOTE) SARS-CoV-2 target nucleic acids are NOT DETECTED.  The SARS-CoV-2 RNA is generally detectable in upper and lower respiratory specimens during the acute phase of infection. The lowest concentration of SARS-CoV-2 viral copies this assay can detect is 250 copies / mL. A negative result does not preclude SARS-CoV-2 infection and should not be used as the sole basis for treatment or other patient management decisions.  A negative result may occur with improper specimen collection / handling, submission of specimen other than nasopharyngeal swab, presence of viral mutation(s) within the areas targeted by this assay, and inadequate number of viral copies (<250 copies / mL). A negative result must be combined with  clinical observations, patient history, and epidemiological information.  Fact Sheet for Patients:   StrictlyIdeas.no  Fact Sheet for Healthcare Providers: BankingDealers.co.za  This test is not yet approved or  cleared by the [place] FDA and has been authorized for detection and/or diagnosis of SARS-CoV-2 by FDA under an Emergency Use Authorization (EUA).  This EUA will remain in effect (meaning this test can be used) for the duration of the COVID-19 declaration under Section 564(b)(1) of the Act, 21 U.S.C. section 360bbb-3(b)(1), unless the authorization is terminated or revoked sooner.  Performed at [facility], [address], [place], [place] [address]       Radiology Studies: CT Head Wo Contrast  Result Date: 03/23/2020 CLINICAL DATA:  85 year old male with neurologic deficit. EXAM: CT HEAD WITHOUT CONTRAST TECHNIQUE: Contiguous axial images were obtained from the base of the skull through the vertex without intravenous contrast. COMPARISON:  Head CT dated 01/19/2020. FINDINGS: Brain: Mild age-related atrophy and chronic microvascular ischemic changes. There is no acute intracranial hemorrhage. No mass effect or midline shift. No extra-axial fluid collection. Vascular: No hyperdense vessel or unexpected calcification. Skull: Normal. Negative for fracture or focal lesion. Sinuses/Orbits: Mild mucoperiosteal thickening of paranasal sinuses. No air-fluid level. The mastoid air cells are clear. Other: None IMPRESSION: 1. No acute intracranial pathology. 2. Mild age-related atrophy and chronic microvascular ischemic changes. Electronically Signed   By: Anner Crete M.D.   On: 03/23/2020 19:31   DG Chest Port 1 View  Result Date: 03/23/2020 CLINICAL DATA:  85 year old male with wheezing. EXAM: PORTABLE CHEST 1 VIEW COMPARISON:  Chest radiograph dated 01/19/2020. FINDINGS: Mild diffuse interstitial prominence and nodularity, chronic.  No new consolidative changes. There is no pleural effusion pneumothorax. Stable cardiac silhouette. Atherosclerotic calcification of the aorta. No acute osseous pathology. IMPRESSION: No interval change.  No new consolidation. Electronically Signed   By: Anner Crete M.D.   On: 03/23/2020 18:49    Scheduled Meds: . aerochamber Z-Stat Plus/medium  1 each Other Once  . budesonide (PULMICORT) nebulizer solution  0.5 mg Nebulization BID  . insulin aspart  0-9 Units Subcutaneous TID WC   Continuous Infusions:   LOS: 0 days    Time spent: 30 minutes.    Barton Dubois, MD [facility]   To contact the attending provider between 7A-7P or the covering provider during after hours 7P-7A, please log into the web site [email or web address] and access using universal [place] password for that web site. If you do not have the password, please call the hospital operator.  03/24/2020, 7:26 PM

## 2020-03-25 ENCOUNTER — Other Ambulatory Visit: Payer: Self-pay | Admitting: *Deleted

## 2020-03-25 ENCOUNTER — Other Ambulatory Visit: Payer: Self-pay | Admitting: Family Medicine

## 2020-03-25 DIAGNOSIS — I5032 Chronic diastolic (congestive) heart failure: Secondary | ICD-10-CM | POA: Diagnosis not present

## 2020-03-25 DIAGNOSIS — R4182 Altered mental status, unspecified: Secondary | ICD-10-CM | POA: Diagnosis not present

## 2020-03-25 DIAGNOSIS — J441 Chronic obstructive pulmonary disease with (acute) exacerbation: Secondary | ICD-10-CM | POA: Diagnosis not present

## 2020-03-25 DIAGNOSIS — R410 Disorientation, unspecified: Secondary | ICD-10-CM | POA: Diagnosis not present

## 2020-03-25 DIAGNOSIS — E1159 Type 2 diabetes mellitus with other circulatory complications: Secondary | ICD-10-CM

## 2020-03-25 DIAGNOSIS — I480 Paroxysmal atrial fibrillation: Secondary | ICD-10-CM | POA: Diagnosis not present

## 2020-03-25 LAB — GLUCOSE, CAPILLARY
Glucose-Capillary: 84 mg/dL (ref 70–99)
Glucose-Capillary: 129 mg/dL — ABNORMAL HIGH (ref 70–99)

## 2020-03-25 MED ORDER — PREGABALIN 100 MG PO CAPS
100.0000 mg | ORAL_CAPSULE | Freq: Two times a day (BID) | ORAL | 1 refills | Status: DC
Start: 2020-03-25 — End: 2020-04-26

## 2020-03-25 MED ORDER — PREDNISONE 20 MG PO TABS: 40.0000 mg | ORAL_TABLET | Freq: Every day | ORAL | 0 refills | Status: AC

## 2020-03-25 MED ORDER — CLONIDINE HCL 0.1 MG PO TABS: 0.1000 mg | ORAL_TABLET | Freq: Every day | ORAL | Status: DC

## 2020-03-25 MED ORDER — POTASSIUM CHLORIDE CRYS ER 20 MEQ PO TBCR: 20.0000 meq | EXTENDED_RELEASE_TABLET | Freq: Two times a day (BID) | ORAL | 2 refills | Status: DC

## 2020-03-25 NOTE — Discharge Summary (Signed)
Physician Discharge Summary  Edward [name] ERX:[id number] DOB: April 12, 1935 DOA: 03/23/2020  PCP: Susy Frizzle, MD  Admit date: 03/23/2020 Discharge date: 03/25/2020  Time spent: 35 minutes  Recommendations for Outpatient Follow-up:  1. Close monitoring of patient's CBGs with further adjustment to hypoglycemic regimen as needed 2. Repeat basic metabolic panel to follow lites renal function 3. Reassess blood pressure and adjust antihypertensive regimen as needed.   Discharge Diagnoses:  Principal Problem:   Altered mental status Active Problems:   COPD (chronic obstructive pulmonary disease) ([name])   Hypertension   DM type 2 causing vascular disease ([name])   AF (paroxysmal atrial fibrillation) ([name])   Chronic diastolic heart failure ([name])   Mixed hyperlipidemia   Normocytic anemia   Discharge Condition: Stable and improved.  Discharged home with instruction to follow-up with PCP in 10 days.  CODE STATUS: Full code  Diet recommendation: Heart healthy/modified carbohydrate diet.  Filed Weights   03/23/20 1704 03/24/20 1626  Weight: 74.8 kg 77.6 kg    History of present illness:  As per H&P written by Dr. Olevia Bowens on 03/23/2020 85 y.o.malewith medical history significant ofallergy, PAF (atrial fibrillation),pulmonary fibrosis,COPDon home oxygen,type IIdiabetes mellitus, hyperlipidemia, hypertension, iron deficiency anemia,history of noncompliance, PSA elevation,vitamin D deficiencywho was brought to the emergency department due to progressively worse weakness, confusion and hyperglycemia for the past 2 weeks associated with worsening mental status earlier today. His son also stated he has been sleeping a lot more than usual. He had a fall earlier in the afternoon after stumbling and falling in his needs. He was unable to stand up after that and had to be lifted to his bed. There has not been any fevers, nausea, emesis, diarrhea or complaints of pain. When seen, the  patient is able to answer simple questions and denies headache, chest pain or abdominal pain. He stated he was not hungry.  ED Course:Initial vital signs were temperature98.5 F, pulse 78, BP 123/65 mmHg and and O2 sat 87% on room air. Most recently his O2 sat is at 100% with nasal cannula oxygen at 2 LPM. He was given 8 units of insulin in the emergency department.  Urinalysis shows glucosuria more than 500 and proteinuria more than 300 mg/dL. There is small hemoglobinuria. Microscopic urine examination is unremarkable. UDS is negative. Fecal occult blood was negative. CBC showed a white count of 11.5, hemoglobin 9.4 g/dL and platelets 217. His CMP shows a sodium 132 and chloride 93 mmol/L, all other electrolytes are within normal range with calcium is corrected to albumin. Glucose 453, BUN 42 and creatinine 1.29 mg/dL. Total protein 6.3 and albumin 3.2 g/dL. AST was 97, ALT, alk phos and bilirubin are within normal range. BNP 298.0 pg/mL.  Imaging: Chest radiograph did not show any acute cardiopulmonary pathology. CT head did not show any acute intracranial pathology. Please see images imported report for further detail.  Hospital Course:  1-Altered mental status -Acute metabolic encephalopathy in the setting of electrolyte abnormalities, dehydration and hyperglycemia. -Outpatient follow-up with PCP in 10 days. -Continue controlling blood sugar and adequate hydration. -Imaging studies demonstrating no acute intracranial abnormalities. -PT ordered and found patient at baseline and safe to return home. -No signs of acute infection.  2-COPD (chronic obstructive pulmonary disease) ([name]) -mild exacerbation appreciated -continue bronchodilators and oral course of steroids. -continue home O2 supplementation -Instructed to be compliant with medication regimen.  3-Hypertension -stable currently -will resume home antihypertensive regimen. -Patient advised to follow heart  healthy diet.  4-DM type 2 causing  vascular disease ([place]) -Improve and stabilize after the use of insulin -Patient encouraged to follow modified carbohydrate diet and to be compliant with medication -Will resume home oral hypoglycemic agents and insulin.  5-AF (paroxysmal atrial fibrillation) ([name]) -Stable and well-controlled heart rate. -Continue metoprolol and secondary prevention with Eliquis.  6-Chronic diastolic heart failure ([name]) -stable and compensated overall -follow daily weight and low-sodium diet. -Resume home diuretics regimen  7-HLD -continue statins  -Heart healthy diet has been encouraged.  Procedures: See below for x-ray reports.  Consultations:  None  Discharge Exam: Vitals:   03/25/20 0740 03/25/20 0830  BP:  (!) 145/73  Pulse:  65  Resp:  18  Temp:  98.3 F (36.8 C)  SpO2: 96% 100%    General: Afebrile, following commands appropriately and oriented x3.  No chest pain, no nausea, no vomiting.  Good oxygen saturation on chronic O2 supplementation. Cardiovascular: S1-S2, no rubs, no gallops, no JVD on exam. Respiratory: Reports significant improvement in his breathing; very tiny expiratory wheezing appreciated on exam, no using accessory muscles, no crackles. Abdomen: Soft, nontender, distended, positive bowel sounds Extremities: No cyanosis, no clubbing.  Discharge Instructions   Discharge Instructions    Diet - low sodium heart healthy   Complete by: As directed    Discharge instructions   Complete by: As directed    Take medications as prescribed and be compliant with regimen. Arrange follow-up with PCP in 10 days Follow modified carbohydrate and heart healthy diet Maintain adequate hydration     Allergies as of 03/25/2020      Reactions   Ace Inhibitors Other (See Comments)   Hyperkalemia--07/23/2013:patient states not familiar with the following allergy      Medication List    STOP taking these medications   gabapentin 300 MG  capsule Commonly known as: NEURONTIN   HYDROcodone-acetaminophen 5-325 MG tablet Commonly known as: NORCO/VICODIN   ipratropium 0.02 % nebulizer solution Commonly known as: ATROVENT   LORazepam 0.5 MG tablet Commonly known as: ATIVAN   oxyCODONE-acetaminophen 7.5-325 MG tablet Commonly known as: Percocet     TAKE these medications   acetaminophen 500 MG tablet Commonly known as: TYLENOL Take 500 mg by mouth every 6 (six) hours as needed for headache.   albuterol (2.5 MG/3ML) 0.083% nebulizer solution Commonly known as: PROVENTIL INHALE 1 VIAL VIA NEBULIZER EVERY 6 HOURS AS NEEDED FOR WHEEZING OR SHORTNESS OF BREATH   Basaglar KwikPen 100 UNIT/ML Inject 0.3 mLs (30 Units total) into the skin at bedtime.   budesonide-formoterol 160-4.5 MCG/ACT inhaler Commonly known as: SYMBICORT Inhale 2 puffs into the lungs 2 (two) times daily.   cloNIDine 0.1 MG tablet Commonly known as: CATAPRES Take 1 tablet (0.1 mg total) by mouth daily. What changed: when to take this   Eliquis 5 MG Tabs tablet Generic drug: apixaban Take 1 tablet by mouth twice daily   furosemide 40 MG tablet Commonly known as: LASIX TAKE 1 TABLET BY MOUTH DAILY   ipratropium-albuterol 0.5-2.5 (3) MG/3ML Soln Commonly known as: DUONEB Take 3 mLs by nebulization every 6 (six) hours as needed.   metFORMIN 1000 MG tablet Commonly known as: GLUCOPHAGE Take 1 tablet (1,000 mg total) by mouth 2 (two) times daily with a meal. What changed: how much to take   metoprolol tartrate 25 MG tablet Commonly known as: LOPRESSOR TAKE 1 TABLET BY MOUTH TWICE DAILY   OneTouch Delica Plus [id number] Misc USE TO CHECK BLOOD SUGAR TWICE DAILY AS DIRECTED   OneTouch Ultra test strip  Generic drug: glucose blood CHECK FASTING BLOOD SUGAR TWICE DAILY   OXYGEN Inhale 3 L into the lungs continuous.   Pen Needles 31G X 6 MM Misc Use with insulin qd   potassium chloride SA 20 MEQ tablet Commonly known as: KLOR-CON Take  1 tablet (20 mEq total) by mouth 2 (two) times daily. What changed:   See the new instructions.  Another medication with the same name was removed. Continue taking this medication, and follow the directions you see here.   pravastatin 80 MG tablet Commonly known as: PRAVACHOL TAKE 1 TABLET BY MOUTH AT BEDTIME   predniSONE 20 MG tablet Commonly known as: DELTASONE Take 2 tablets (40 mg total) by mouth daily with breakfast for 4 days. Start taking on: March 26, 2020   pregabalin 100 MG capsule Commonly known as: Lyrica Take 1 capsule (100 mg total) by mouth 2 (two) times daily. What changed:   medication strength  how much to take      Allergies  Allergen Reactions  . Ace Inhibitors Other (See Comments)    Hyperkalemia--07/23/2013:patient states not familiar with the following allergy    The results of significant diagnostics from this hospitalization (including imaging, microbiology, ancillary and laboratory) are listed below for reference.    Significant Diagnostic Studies: CT Head Wo Contrast  Result Date: 03/23/2020 CLINICAL DATA:  85 year old male with neurologic deficit. EXAM: CT HEAD WITHOUT CONTRAST TECHNIQUE: Contiguous axial images were obtained from the base of the skull through the vertex without intravenous contrast. COMPARISON:  Head CT dated 01/19/2020. FINDINGS: Brain: Mild age-related atrophy and chronic microvascular ischemic changes. There is no acute intracranial hemorrhage. No mass effect or midline shift. No extra-axial fluid collection. Vascular: No hyperdense vessel or unexpected calcification. Skull: Normal. Negative for fracture or focal lesion. Sinuses/Orbits: Mild mucoperiosteal thickening of paranasal sinuses. No air-fluid level. The mastoid air cells are clear. Other: None IMPRESSION: 1. No acute intracranial pathology. 2. Mild age-related atrophy and chronic microvascular ischemic changes. Electronically Signed   By: Anner Crete M.D.   On:  03/23/2020 19:31   DG Chest Port 1 View  Result Date: 03/23/2020 CLINICAL DATA:  85 year old male with wheezing. EXAM: PORTABLE CHEST 1 VIEW COMPARISON:  Chest radiograph dated 01/19/2020. FINDINGS: Mild diffuse interstitial prominence and nodularity, chronic. No new consolidative changes. There is no pleural effusion pneumothorax. Stable cardiac silhouette. Atherosclerotic calcification of the aorta. No acute osseous pathology. IMPRESSION: No interval change.  No new consolidation. Electronically Signed   By: Anner Crete M.D.   On: 03/23/2020 18:49    Microbiology: Recent Results (from the past 240 hour(s))  SARS Coronavirus 2 by RT PCR (hospital order, performed in [facility] hospital lab) Nasopharyngeal Nasopharyngeal Swab     Status: None   Collection Time: 03/23/20  5:58 PM   Specimen: Nasopharyngeal Swab  Result Value Ref Range Status   SARS Coronavirus 2 NEGATIVE NEGATIVE Final    Comment: (NOTE) SARS-CoV-2 target nucleic acids are NOT DETECTED.  The SARS-CoV-2 RNA is generally detectable in upper and lower respiratory specimens during the acute phase of infection. The lowest concentration of SARS-CoV-2 viral copies this assay can detect is 250 copies / mL. A negative result does not preclude SARS-CoV-2 infection and should not be used as the sole basis for treatment or other patient management decisions.  A negative result may occur with improper specimen collection / handling, submission of specimen other than nasopharyngeal swab, presence of viral mutation(s) within the areas targeted by this assay, and  inadequate number of viral copies (<250 copies / mL). A negative result must be combined with clinical observations, patient history, and epidemiological information.  Fact Sheet for Patients:   StrictlyIdeas.no  Fact Sheet for Healthcare Providers: BankingDealers.co.za  This test is not yet approved or  cleared by the  [place] FDA and has been authorized for detection and/or diagnosis of SARS-CoV-2 by FDA under an Emergency Use Authorization (EUA).  This EUA will remain in effect (meaning this test can be used) for the duration of the COVID-19 declaration under Section 564(b)(1) of the Act, 21 U.S.C. section 360bbb-3(b)(1), unless the authorization is terminated or revoked sooner.  Performed at [facility], [address], [place], [place] [address]      Labs: Basic Metabolic Panel: Recent Labs  Lab 03/23/20 1705 03/24/20 0343  NA 132* 135  K 4.9 4.2  CL 93* 95*  CO2 29 30  GLUCOSE 453* 157*  BUN 42* 43*  CREATININE 1.29* 1.16  CALCIUM 8.5* 8.7*   Liver Function Tests: Recent Labs  Lab 03/23/20 1705 03/24/20 0343  AST 97* 99*  ALT 31 34  ALKPHOS 63 58  BILITOT 0.4 0.5  PROT 6.3* 5.9*  ALBUMIN 3.2* 3.1*   CBC: Recent Labs  Lab 03/23/20 1705 03/24/20 0343  WBC 11.5* 7.8  NEUTROABS 9.0*  --   HGB 9.4* 9.4*  HCT 29.6* 30.2*  MCV 92.2 93.5  PLT 217 220   BNP (last 3 results) Recent Labs    12/27/19 1940 03/23/20 1705  BNP 68.0 298.0*    CBG: Recent Labs  Lab 03/24/20 1113 03/24/20 1623 03/24/20 2112 03/25/20 0731 03/25/20 1106  GLUCAP 186* 138* 196* 84 129*    Signed:  Barton Dubois MD.  [facility] 03/25/2020, 12:20 PM

## 2020-03-25 NOTE — Evaluation (Signed)
Physical Therapy Evaluation Patient Details Name: Edward [name] MRN: [id number] DOB: May 20, 1935 Today's Date: 03/25/2020   History of Present Illness  Edward [name] is a 85 y.o. male with medical history significant of allergy, PAF (atrial fibrillation), pulmonary fibrosis, COPD on home oxygen, type II diabetes mellitus, hyperlipidemia, hypertension, iron deficiency anemia, history of noncompliance, PSA elevation, vitamin D deficiency who was brought to the emergency department due to progressively worse weakness, confusion and hyperglycemia for the past 2 weeks associated with worsening mental status earlier today.  His son also stated he has been sleeping a lot more than usual.  He had a fall earlier in the afternoon after stumbling and falling in his needs.  He was unable to stand up after that and had to be lifted to his bed.  There has not been any fevers, nausea, emesis, diarrhea or complaints of pain.  When seen, the patient is able to answer simple questions and denies headache, chest pain or abdominal pain.  He stated he was not hungry.    Clinical Impression  Patient functioning at baseline for functional mobility and gait.  Patient ambulated in hallway without loss of balance while on 2 LPM O2 with SpO2 at 94% and tolerated sitting up in chair with family members present at bedside.  Plan:  Patient discharged from physical therapy to care of nursing for ambulation daily as tolerated for length of stay.     Follow Up Recommendations No PT follow up;Supervision - Intermittent    Equipment Recommendations  None recommended by PT    Recommendations for Other Services       Precautions / Restrictions Precautions Precautions: None Precaution Comments: Home O2 dependent Restrictions Weight Bearing Restrictions: No      Mobility  Bed Mobility Overal bed mobility: Modified Independent                Transfers Overall transfer level: Modified independent Equipment used:  None;Straight cane                Ambulation/Gait Ambulation/Gait assistance: Modified independent (Device/Increase time) Gait Distance (Feet): 200 Feet Assistive device: Straight cane Gait Pattern/deviations: Step-through pattern;WFL(Within Functional Limits) Gait velocity: decreased   General Gait Details: grossly WFL with good return for use of SPC in right or left hand with mostly 2 point gait pattern  Stairs            Wheelchair Mobility    Modified Rankin (Stroke Patients Only)       Balance Overall balance assessment: Needs assistance Sitting-balance support: Feet supported;No upper extremity supported Sitting balance-Leahy Scale: Good Sitting balance - Comments: seated at EOB   Standing balance support: During functional activity;Single extremity supported Standing balance-Leahy Scale: Fair Standing balance comment: fair/good using SPC                             Pertinent Vitals/Pain Pain Assessment: No/denies pain    Home Living Family/patient expects to be discharged to:: Private residence Living Arrangements: Spouse/significant other;Children Available Help at Discharge: Family;Available 24 hours/day Type of Home: Mobile home Home Access: Stairs to enter Entrance Stairs-Rails: Right;Left;Can reach both Entrance Stairs-Number of Steps: 5 Home Layout: One level Home Equipment: Cane - single point;Walker - 4 wheels;Bedside commode      Prior Function Level of Independence: Independent with assistive device(s)         Comments: Household ambulator using [place]  Dominant Hand: Right    Extremity/Trunk Assessment   Upper Extremity Assessment Upper Extremity Assessment: Overall WFL for tasks assessed;RUE deficits/detail RUE Deficits / Details: grossly 4/5 except shoulder baseline 2+/5 RUE Sensation: WNL RUE Coordination: WNL         Cervical / Trunk Assessment Cervical / Trunk Assessment: Normal   Communication   Communication: No difficulties  Cognition Arousal/Alertness: Awake/alert Behavior During Therapy: WFL for tasks assessed/performed Overall Cognitive Status: Within Functional Limits for tasks assessed                                        General Comments      Exercises     Assessment/Plan    PT Assessment Patent does not need any further PT services  PT Problem List         PT Treatment Interventions      PT Goals (Current goals can be found in the Care Plan section)  Acute Rehab PT Goals Patient Stated Goal: return home with family to assist PT Goal Formulation: With patient/family Time For Goal Achievement: 03/25/20 Potential to Achieve Goals: Good    Frequency     Barriers to discharge        Co-evaluation               AM-PAC PT "6 Clicks" Mobility  Outcome Measure Help needed turning from your back to your side while in a flat bed without using bedrails?: None Help needed moving from lying on your back to sitting on the side of a flat bed without using bedrails?: None Help needed moving to and from a bed to a chair (including a wheelchair)?: None Help needed standing up from a chair using your arms (e.g., wheelchair or bedside chair)?: None Help needed to walk in hospital room?: None Help needed climbing 3-5 steps with a railing? : A Little 6 Click Score: 23    End of Session Equipment Utilized During Treatment: Oxygen Activity Tolerance: Patient tolerated treatment well Patient left: in chair;with call bell/phone within reach;with family/visitor present Nurse Communication: Mobility status PT Visit Diagnosis: Unsteadiness on feet (R26.81);Other abnormalities of gait and mobility (R26.89);Muscle weakness (generalized) (M62.81)    Time: 0932-6712 PT Time Calculation (min) (ACUTE ONLY): 21 min   Charges:   PT Evaluation $PT Eval Moderate Complexity: 1 Mod PT Treatments $Therapeutic Activity: 8-22 mins         10:38 AM, 03/25/20 Lonell Grandchild, MPT Physical Therapist with [facility] [phone] office [phone] mobile phone

## 2020-03-25 NOTE — Patient Outreach (Signed)
[place] [facility]) Care Management  03/25/2020  Edward [name] [birth year] [id number]   [place]  Referral Date: 10/15/2018 Referral Source: Transfer from [place] Reason for Referral: Continued Disease Management Education Insurance:[employer] Medicare   Outreach Attempt:  Received notification patient admitted to [facility] for altered mental status.  RN Health Coach notified [facility] Liaison of patient's hospital admission.  Plan:  RN Health Coach will await [facility] Liaisons recommendations for discharge follow up.  [place] [phone] [name]

## 2020-03-25 NOTE — Progress Notes (Signed)
Nsg Discharge Note  Admit Date:  03/23/2020 Discharge date: 03/25/2020   Edward [name] to be D/C'd Home per MD order.  AVS completed.  Copy for chart, and copy for patient signed, and dated. Patient/caregiver able to verbalize understanding.  Discharge Medication: Allergies as of 03/25/2020      Reactions   Ace Inhibitors Other (See Comments)   Hyperkalemia--07/23/2013:patient states not familiar with the following allergy      Medication List    STOP taking these medications   gabapentin 300 MG capsule Commonly known as: NEURONTIN   HYDROcodone-acetaminophen 5-325 MG tablet Commonly known as: NORCO/VICODIN   ipratropium 0.02 % nebulizer solution Commonly known as: ATROVENT   LORazepam 0.5 MG tablet Commonly known as: ATIVAN   oxyCODONE-acetaminophen 7.5-325 MG tablet Commonly known as: Percocet     TAKE these medications   acetaminophen 500 MG tablet Commonly known as: TYLENOL Take 500 mg by mouth every 6 (six) hours as needed for headache.   albuterol (2.5 MG/3ML) 0.083% nebulizer solution Commonly known as: PROVENTIL INHALE 1 VIAL VIA NEBULIZER EVERY 6 HOURS AS NEEDED FOR WHEEZING OR SHORTNESS OF BREATH   Basaglar KwikPen 100 UNIT/ML Inject 0.3 mLs (30 Units total) into the skin at bedtime.   budesonide-formoterol 160-4.5 MCG/ACT inhaler Commonly known as: SYMBICORT Inhale 2 puffs into the lungs 2 (two) times daily.   cloNIDine 0.1 MG tablet Commonly known as: CATAPRES Take 1 tablet (0.1 mg total) by mouth daily. What changed: when to take this   Eliquis 5 MG Tabs tablet Generic drug: apixaban Take 1 tablet by mouth twice daily   furosemide 40 MG tablet Commonly known as: LASIX TAKE 1 TABLET BY MOUTH DAILY   ipratropium-albuterol 0.5-2.5 (3) MG/3ML Soln Commonly known as: DUONEB Take 3 mLs by nebulization every 6 (six) hours as needed.   metFORMIN 1000 MG tablet Commonly known as: GLUCOPHAGE Take 1 tablet (1,000 mg total) by mouth 2 (two) times  daily with a meal. What changed: how much to take   metoprolol tartrate 25 MG tablet Commonly known as: LOPRESSOR TAKE 1 TABLET BY MOUTH TWICE DAILY   OneTouch Delica Plus [id number] Misc USE TO CHECK BLOOD SUGAR TWICE DAILY AS DIRECTED   OneTouch Ultra test strip Generic drug: glucose blood CHECK FASTING BLOOD SUGAR TWICE DAILY   OXYGEN Inhale 3 L into the lungs continuous.   Pen Needles 31G X 6 MM Misc Use with insulin qd   potassium chloride SA 20 MEQ tablet Commonly known as: KLOR-CON Take 1 tablet (20 mEq total) by mouth 2 (two) times daily. What changed:   See the new instructions.  Another medication with the same name was removed. Continue taking this medication, and follow the directions you see here.   pravastatin 80 MG tablet Commonly known as: PRAVACHOL TAKE 1 TABLET BY MOUTH AT BEDTIME   predniSONE 20 MG tablet Commonly known as: DELTASONE Take 2 tablets (40 mg total) by mouth daily with breakfast for 4 days. Start taking on: March 26, 2020   pregabalin 100 MG capsule Commonly known as: Lyrica Take 1 capsule (100 mg total) by mouth 2 (two) times daily. What changed:   medication strength  how much to take       Discharge Assessment: Vitals:   03/25/20 0830 03/25/20 1230  BP: (!) 145/73 (!) 150/69  Pulse: 65 64  Resp: 18 18  Temp: 98.3 F (36.8 C) 97.8 F (36.6 C)  SpO2: 100% 100%   Skin clean, dry and intact without  evidence of skin break down, no evidence of skin tears noted. IV catheter discontinued intact. Site without signs and symptoms of complications - no redness or edema noted at insertion site, patient denies c/o pain - only slight tenderness at site.  Dressing with slight pressure applied.  D/c Instructions-Education: Discharge instructions given to patient/family with verbalized understanding. D/c education completed with patient/family including follow up instructions, medication list, d/c activities limitations if indicated,  with other d/c instructions as indicated by MD - patient able to verbalize understanding, all questions fully answered. Patient instructed to return to ED, call 911, or call MD for any changes in condition.  Patient escorted via [place], and D/C home via private auto.  Dorcas Mcmurray, LPN 8/54/8830 1:41 PM

## 2020-03-25 NOTE — TOC Transition Note (Signed)
Transition of Care [facility]) - CM/SW Discharge Note   Patient Details  Name: Edward [name] MRN: [id number] Date of Birth: 11-Dec-1934  [facility] CM/SW Contact:  Boneta Lucks, RN Phone Number: 03/25/2020, 2:03 PM   Clinical Narrative:   Patient admitted with altered mental status. Discharging home today. [place] RN orders have been placed. TOC contacted son and patient, Patient does not want HH. TOC advised patient to keep PCP appointment and if he need further help at home to ask PCP for [facility] orders.       Barriers to Discharge: Barriers Resolved   Patient Goals and CMS Choice Patient states their goals for this hospitalization and ongoing recovery are:: to go home. CMS Medicare.gov Compare Post Acute Care list provided to:: Patient    Discharge Placement            Discharge Plan and Services

## 2020-03-26 ENCOUNTER — Other Ambulatory Visit: Payer: Self-pay

## 2020-03-26 ENCOUNTER — Other Ambulatory Visit: Payer: Self-pay | Admitting: *Deleted

## 2020-03-26 NOTE — Patient Outreach (Signed)
[place] [facility]) Care Management  03/26/2020  MARSALIS BEAULIEU 1935/06/05 [id number]   [place] Closure  Referral Date: 10/15/2018 Referral Source: Transfer from [place] Reason for Referral: Continued Disease Management Education [place] Medicare   Outreach Attempt:  Patient discharged from [facility] on 03/25/2020.  [facility] Liaison will refer to [place] Coordinator for transitions of care post discharge.  Plan:  RN Health Coach will close Disease Management Case.  RN Health Coach will send primary care provider Discipline Closure Letter.  [place] [phone] [email or web address]

## 2020-03-31 ENCOUNTER — Ambulatory Visit: Payer: Self-pay | Admitting: Pharmacist

## 2020-03-31 ENCOUNTER — Other Ambulatory Visit: Payer: Self-pay | Admitting: *Deleted

## 2020-03-31 ENCOUNTER — Encounter: Payer: Self-pay | Admitting: *Deleted

## 2020-03-31 DIAGNOSIS — E1140 Type 2 diabetes mellitus with diabetic neuropathy, unspecified: Secondary | ICD-10-CM

## 2020-03-31 NOTE — Patient Outreach (Signed)
[place] [name]) Care Management  03/31/2020  ANDRES BANTZ 07/02/1935 [id number]  Referral received 6/25 Initial Outreach 6/30 Transition of care completed by Provider's office   Telephone Assessment-Successful-Enrollment COPD  RN spoke with pt today and introduced [facility] services and the purpose for today's call. Pt receptive and answer all inquires concerning the complete assessment and discussed formal enrollment with [facility] health coach. Pt remains open to re-enrolling once again with COPD diagnosis. Plan of care discussed and generated long with goals and interventions. Offered to send [employer] education material on COPD and education accordingly on the COPD action plan related to the zones. Verified pt is in the GREEN zone today with no other issues present. Strongly encouraged pt on what to do if acute symptoms are presented to avoid the risk of readmission. All inquires addressed as pt reports his medications (neblulizer and inhalers). Will stress the importance or reduce flare ups with his COPD to avoid increase symptoms of his condition getting worse. Will educate on the different levels of COPD classes with the goal to lower the risk of exacerbated symptoms. Pt verbalized an understanding and willing to continue participation with this RN case manager in managing his medical issues better related to his COPD.   Plan: Will send Successful and Welcome letters and notify pt's provider of his disposition with [facility] services. Will follow up next month if not sooner with ongoing case management services and follow up on pt's ongoing management of care.   THN CM Care Plan Problem One     Most Recent Value  Care Plan Problem One Deficient Knowledge Relate to COPD symptom management/action plan   Role Documenting the Problem One Care Management Telephonic Coordinator  Care Plan for Problem One Active  THN Long Term Goal  Pt will verbalize two symptoms of COPD exacerbation within  the next 90 days.  THN Long Term Goal Start Date 03/31/20  Interventions for Problem One Long Term Goal Will educate on COPD and stress the importance of  daily management of care. Will send emmi related educational material and review accordingly.   THN CM Short Term Goal #1  Pt will adhere to all post op medications over the next 30 days.  THN CM Short Term Goal #1 Start Date 03/31/20  Interventions for Short Term Goal #1 Will review all medictaions and verified pt all prescribed medications and taking them according to the providers recommendations. WIll educate accordingly.  THN CM Short Term Goal #2  Pt will adhere to all scheduled medical appointments over the next 30 days.  THN CM Short Term Goal #2 Start Date 03/31/20  Interventions for Short Term Goal #2 Will verify all pending medical appointments with sufficient transportation and strongly encouraged pt to attend if unabe to to call to rescheduled to avoid possible incurred fees. Will also offer other sources of transportation if needed in the future.       Raina Mina, RN Care Management Coordinator [place] Office [phone]

## 2020-03-31 NOTE — Chronic Care Management (AMB) (Signed)
Chronic Care Management   Follow Up Note   03/31/2020 Name: SEELEY HISSONG MRN: [id number] DOB: 1935/02/06  Referred by: Susy Frizzle, MD Reason for referral : No chief complaint on file.   Eloy KIAH KEAY is a 85 y.o. year old male who is a primary care patient of [name], [name], MD. The CCM team was consulted for assistance with chronic disease management and care coordination needs.    Review of patient status, including review of consultants reports, relevant laboratory and other test results, and collaboration with appropriate care team members and the patient's provider was performed as part of comprehensive patient evaluation and provision of chronic care management services.    SDOH (Social Determinants of Health) assessments performed: No See Care Plan activities for detailed interventions related to [facility])     Outpatient Encounter Medications as of 03/31/2020  Medication Sig  . acetaminophen (TYLENOL) 500 MG tablet Take 500 mg by mouth every 6 (six) hours as needed for headache.  . albuterol (PROVENTIL) (2.5 MG/3ML) 0.083% nebulizer solution INHALE 1 VIAL VIA NEBULIZER EVERY 6 HOURS AS NEEDED FOR WHEEZING OR SHORTNESS OF BREATH  . budesonide-formoterol (SYMBICORT) 160-4.5 MCG/ACT inhaler Inhale 2 puffs into the lungs 2 (two) times daily.  . cloNIDine (CATAPRES) 0.1 MG tablet Take 1 tablet (0.1 mg total) by mouth daily.  Marland Kitchen ELIQUIS 5 MG TABS tablet Take 1 tablet by mouth twice daily  . furosemide (LASIX) 40 MG tablet TAKE 1 TABLET BY MOUTH DAILY  . Insulin Glargine (BASAGLAR KWIKPEN) 100 UNIT/ML SOPN Inject 0.3 mLs (30 Units total) into the skin at bedtime.  . Insulin Pen Needle (PEN NEEDLES) 31G X 6 MM MISC Use with insulin qd  . ipratropium-albuterol (DUONEB) 0.5-2.5 (3) MG/3ML SOLN Take 3 mLs by nebulization every 6 (six) hours as needed.  . Lancets (ONETOUCH DELICA PLUS [id number]) MISC USE TO CHECK BLOOD SUGAR TWICE DAILY AS DIRECTED  . metFORMIN (GLUCOPHAGE) 1000 MG  tablet Take 1 tablet (1,000 mg total) by mouth 2 (two) times daily with a meal. (Patient taking differently: Take 500 mg by mouth 2 (two) times daily with a meal. )  . metoprolol tartrate (LOPRESSOR) 25 MG tablet TAKE 1 TABLET BY MOUTH TWICE DAILY  . ONETOUCH ULTRA test strip CHECK FASTING BLOOD SUGAR TWICE DAILY  . OXYGEN Inhale 3 L into the lungs continuous.   . potassium chloride SA (KLOR-CON) 20 MEQ tablet Take 1 tablet (20 mEq total) by mouth 2 (two) times daily.  . pravastatin (PRAVACHOL) 80 MG tablet TAKE 1 TABLET BY MOUTH AT BEDTIME  . pregabalin (LYRICA) 100 MG capsule Take 1 capsule (100 mg total) by mouth 2 (two) times daily.   No facility-administered encounter medications on file as of 03/31/2020.     Objective:  Contact BI cares to determine status of Jardiance PAP.  Goals Addressed            This Visit's Progress   . Type II Diabetes - A1c < 7%       CARE PLAN ENTRY (see longitudinal plan of care for additional care plan information)  Current Barriers:  . Type II diabetes complicated by chronic medical conditions including hyperlipidemia, hypertension, and COPD. Lab Results  Component Value Date   HGBA1C 11.8 (H) 01/23/2020 .   Lab Results  Component Value Date   CREATININE 0.88 01/23/2020   CREATININE 0.88 01/19/2020   CREATININE 1.07 12/27/2019   . Current antihyperglycemic regimen: Metformin 1000mg  twice daily, Basaglar 100u/ml 30 units hs .  Denies hypoglycemic symptoms, including dizziness, lightheadedness, shaking, sweating . 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 . Current meal patterns: o Breakfast:Biscuit daily, sometimes grits, eggs o Supper:Cornbread, tomato sandwich on white bread, banana sandwich on white bread o Snacks: peanut butter on crackers o Drinks:diet soda . Current exercise: none . Current blood glucose readings:   Pharmacist Clinical Goal(s):  Marland Kitchen Over the next 180 days, patient will work with PharmD and primary care provider to  address hyperglycemia and elevated A1c.  Interventions: . Comprehensive medication review performed, medication list updated in electronic medical record . Inter-disciplinary care team collaboration (see longitudinal plan of care) . Check blood sugar nightly a couple times per week and record in log until next meeting with PharmD. Marland Kitchen Patient approved for temporary 90 day supply of Jardiance.  Patient Self Care Activities:  . Patient will check blood glucose once or twice daily, document, and provide at future appointments . Patient will focus on medication adherence by pill count . Patient will take medications as prescribed . Patient will contact provider with any episodes of hypoglycemia . Patient will report any questions or concerns to provider  . Over the next 180 days patient will try to check blood sugar and additional time daily, preferably night time 2 hours after eating dinner and report consistent readings > 150 to PharmD or PCP. Marland Kitchen Begin taking Jardiance 10mg  once daily and continue to monitor blood sugar, report and < 70 to PharmD.  Please see past updates related to this goal by clicking on the "Past Updates" button in the selected goal        BI cares program informed me Mr. Binns was denied because he can potentially qualify for LIS.  Temporary 90 day fill of Jardiance was sent out to patient pending approval/denial of LIS.  If denied he will need a letter of denial from LIS to qualify for BI cares PAP program.  Jardiance pending delivery 04/02/2020.  Contacted patient to let him know and to counsel on medication. ---LVM to return call.  Plan:   The patient has been provided with contact information for the care management team and has been advised to call with any health related questions or concerns.    Patient is to start taking Jardiance 10mg  once daily, monitor blood sugar 2 to 3 times per day and report any < 70 to PharmD.  Beverly Milch, PharmD Clinical  Pharmacist [place] [phone]

## 2020-04-01 DIAGNOSIS — J449 Chronic obstructive pulmonary disease, unspecified: Secondary | ICD-10-CM | POA: Diagnosis not present

## 2020-04-08 ENCOUNTER — Inpatient Hospital Stay: Payer: Medicare Other | Admitting: Family Medicine

## 2020-04-14 ENCOUNTER — Other Ambulatory Visit: Payer: Self-pay

## 2020-04-14 ENCOUNTER — Encounter (HOSPITAL_COMMUNITY): Payer: Self-pay

## 2020-04-14 ENCOUNTER — Inpatient Hospital Stay (HOSPITAL_COMMUNITY)
Admission: EM | Admit: 2020-04-14 | Discharge: 2020-04-20 | DRG: 871 | Disposition: A | Discharge status: Home Health | Payer: Medicare Other | Attending: Family Medicine | Admitting: Family Medicine

## 2020-04-14 ENCOUNTER — Emergency Department (HOSPITAL_COMMUNITY): Payer: Medicare Other

## 2020-04-14 DIAGNOSIS — Z79899 Other long term (current) drug therapy: Secondary | ICD-10-CM

## 2020-04-14 DIAGNOSIS — R7881 Bacteremia: Secondary | ICD-10-CM | POA: Diagnosis not present

## 2020-04-14 DIAGNOSIS — E1165 Type 2 diabetes mellitus with hyperglycemia: Secondary | ICD-10-CM | POA: Diagnosis not present

## 2020-04-14 DIAGNOSIS — Z7951 Long term (current) use of inhaled steroids: Secondary | ICD-10-CM

## 2020-04-14 DIAGNOSIS — E785 Hyperlipidemia, unspecified: Secondary | ICD-10-CM | POA: Diagnosis not present

## 2020-04-14 DIAGNOSIS — A4102 Sepsis due to Methicillin resistant Staphylococcus aureus: Secondary | ICD-10-CM | POA: Diagnosis present

## 2020-04-14 DIAGNOSIS — I480 Paroxysmal atrial fibrillation: Secondary | ICD-10-CM | POA: Diagnosis not present

## 2020-04-14 DIAGNOSIS — J449 Chronic obstructive pulmonary disease, unspecified: Secondary | ICD-10-CM | POA: Diagnosis not present

## 2020-04-14 DIAGNOSIS — I330 Acute and subacute infective endocarditis: Secondary | ICD-10-CM | POA: Diagnosis present

## 2020-04-14 DIAGNOSIS — T447X6A Underdosing of beta-adrenoreceptor antagonists, initial encounter: Secondary | ICD-10-CM | POA: Diagnosis present

## 2020-04-14 DIAGNOSIS — Z8249 Family history of ischemic heart disease and other diseases of the circulatory system: Secondary | ICD-10-CM

## 2020-04-14 DIAGNOSIS — R6889 Other general symptoms and signs: Secondary | ICD-10-CM | POA: Diagnosis not present

## 2020-04-14 DIAGNOSIS — D649 Anemia, unspecified: Secondary | ICD-10-CM | POA: Diagnosis not present

## 2020-04-14 DIAGNOSIS — I110 Hypertensive heart disease with heart failure: Secondary | ICD-10-CM | POA: Diagnosis not present

## 2020-04-14 DIAGNOSIS — Z833 Family history of diabetes mellitus: Secondary | ICD-10-CM

## 2020-04-14 DIAGNOSIS — Z03818 Encounter for observation for suspected exposure to other biological agents ruled out: Secondary | ICD-10-CM | POA: Diagnosis not present

## 2020-04-14 DIAGNOSIS — D509 Iron deficiency anemia, unspecified: Secondary | ICD-10-CM | POA: Diagnosis present

## 2020-04-14 DIAGNOSIS — E1159 Type 2 diabetes mellitus with other circulatory complications: Secondary | ICD-10-CM | POA: Diagnosis not present

## 2020-04-14 DIAGNOSIS — I499 Cardiac arrhythmia, unspecified: Secondary | ICD-10-CM | POA: Diagnosis not present

## 2020-04-14 DIAGNOSIS — G9341 Metabolic encephalopathy: Secondary | ICD-10-CM | POA: Diagnosis not present

## 2020-04-14 DIAGNOSIS — J9611 Chronic respiratory failure with hypoxia: Secondary | ICD-10-CM | POA: Diagnosis present

## 2020-04-14 DIAGNOSIS — E860 Dehydration: Secondary | ICD-10-CM | POA: Diagnosis present

## 2020-04-14 DIAGNOSIS — E11649 Type 2 diabetes mellitus with hypoglycemia without coma: Secondary | ICD-10-CM | POA: Diagnosis not present

## 2020-04-14 DIAGNOSIS — Z9981 Dependence on supplemental oxygen: Secondary | ICD-10-CM | POA: Diagnosis not present

## 2020-04-14 DIAGNOSIS — R600 Localized edema: Secondary | ICD-10-CM

## 2020-04-14 DIAGNOSIS — E876 Hypokalemia: Secondary | ICD-10-CM | POA: Diagnosis present

## 2020-04-14 DIAGNOSIS — Z91138 Patient's unintentional underdosing of medication regimen for other reason: Secondary | ICD-10-CM

## 2020-04-14 DIAGNOSIS — E870 Hyperosmolality and hypernatremia: Secondary | ICD-10-CM | POA: Diagnosis not present

## 2020-04-14 DIAGNOSIS — T383X6A Underdosing of insulin and oral hypoglycemic [antidiabetic] drugs, initial encounter: Secondary | ICD-10-CM | POA: Diagnosis not present

## 2020-04-14 DIAGNOSIS — G9340 Encephalopathy, unspecified: Secondary | ICD-10-CM

## 2020-04-14 DIAGNOSIS — Z20822 Contact with and (suspected) exposure to covid-19: Secondary | ICD-10-CM | POA: Diagnosis not present

## 2020-04-14 DIAGNOSIS — Z9109 Other allergy status, other than to drugs and biological substances: Secondary | ICD-10-CM

## 2020-04-14 DIAGNOSIS — I5032 Chronic diastolic (congestive) heart failure: Secondary | ICD-10-CM | POA: Diagnosis present

## 2020-04-14 DIAGNOSIS — Z794 Long term (current) use of insulin: Secondary | ICD-10-CM

## 2020-04-14 DIAGNOSIS — Z743 Need for continuous supervision: Secondary | ICD-10-CM | POA: Diagnosis not present

## 2020-04-14 DIAGNOSIS — N179 Acute kidney failure, unspecified: Secondary | ICD-10-CM | POA: Diagnosis not present

## 2020-04-14 DIAGNOSIS — Z09 Encounter for follow-up examination after completed treatment for conditions other than malignant neoplasm: Secondary | ICD-10-CM

## 2020-04-14 DIAGNOSIS — E1140 Type 2 diabetes mellitus with diabetic neuropathy, unspecified: Secondary | ICD-10-CM

## 2020-04-14 DIAGNOSIS — J8410 Pulmonary fibrosis, unspecified: Secondary | ICD-10-CM | POA: Diagnosis not present

## 2020-04-14 DIAGNOSIS — B9562 Methicillin resistant Staphylococcus aureus infection as the cause of diseases classified elsewhere: Secondary | ICD-10-CM | POA: Diagnosis not present

## 2020-04-14 DIAGNOSIS — E7800 Pure hypercholesterolemia, unspecified: Secondary | ICD-10-CM | POA: Diagnosis not present

## 2020-04-14 DIAGNOSIS — R0602 Shortness of breath: Secondary | ICD-10-CM | POA: Diagnosis not present

## 2020-04-14 DIAGNOSIS — R739 Hyperglycemia, unspecified: Secondary | ICD-10-CM

## 2020-04-14 DIAGNOSIS — Z7901 Long term (current) use of anticoagulants: Secondary | ICD-10-CM

## 2020-04-14 DIAGNOSIS — I10 Essential (primary) hypertension: Secondary | ICD-10-CM | POA: Diagnosis present

## 2020-04-14 DIAGNOSIS — Z87891 Personal history of nicotine dependence: Secondary | ICD-10-CM

## 2020-04-14 LAB — COMPREHENSIVE METABOLIC PANEL
ALT: 14 U/L (ref 0–44)
AST: 11 U/L — ABNORMAL LOW (ref 15–41)
Albumin: 4.1 g/dL (ref 3.5–5.0)
Alkaline Phosphatase: 90 U/L (ref 38–126)
Anion gap: 18 — ABNORMAL HIGH (ref 5–15)
BUN: 47 mg/dL — ABNORMAL HIGH (ref 8–23)
CO2: 29 mmol/L (ref 22–32)
Calcium: 9.6 mg/dL (ref 8.9–10.3)
Chloride: 97 mmol/L — ABNORMAL LOW (ref 98–111)
Creatinine, Ser: 1.49 mg/dL — ABNORMAL HIGH (ref 0.61–1.24)
GFR calc Af Amer: 50 mL/min — ABNORMAL LOW (ref >60)
GFR calc non Af Amer: 43 mL/min — ABNORMAL LOW (ref >60)
Glucose, Bld: 511 mg/dL (ref 70–99)
Potassium: 4.2 mmol/L (ref 3.5–5.1)
Sodium: 144 mmol/L (ref 135–145)
Total Bilirubin: 0.8 mg/dL (ref 0.3–1.2)
Total Protein: 8.2 g/dL — ABNORMAL HIGH (ref 6.5–8.1)

## 2020-04-14 LAB — BASIC METABOLIC PANEL
Anion gap: 18 — ABNORMAL HIGH (ref 5–15)
Anion gap: 14 (ref 5–15)
Anion gap: 10 (ref 5–15)
BUN: 48 mg/dL — ABNORMAL HIGH (ref 8–23)
BUN: 44 mg/dL — ABNORMAL HIGH (ref 8–23)
BUN: 41 mg/dL — ABNORMAL HIGH (ref 8–23)
CO2: 28 mmol/L (ref 22–32)
CO2: 24 mmol/L (ref 22–32)
CO2: 31 mmol/L (ref 22–32)
Calcium: 9.4 mg/dL (ref 8.9–10.3)
Calcium: 8.6 mg/dL — ABNORMAL LOW (ref 8.9–10.3)
Calcium: 9 mg/dL (ref 8.9–10.3)
Chloride: 99 mmol/L (ref 98–111)
Chloride: 106 mmol/L (ref 98–111)
Chloride: 109 mmol/L (ref 98–111)
Creatinine, Ser: 1.56 mg/dL — ABNORMAL HIGH (ref 0.61–1.24)
Creatinine, Ser: 1.47 mg/dL — ABNORMAL HIGH (ref 0.61–1.24)
Creatinine, Ser: 1.26 mg/dL — ABNORMAL HIGH (ref 0.61–1.24)
GFR calc Af Amer: 47 mL/min — ABNORMAL LOW (ref >60)
GFR calc Af Amer: 51 mL/min — ABNORMAL LOW (ref >60)
GFR calc Af Amer: >60 mL/min (ref >60)
GFR calc non Af Amer: 41 mL/min — ABNORMAL LOW (ref >60)
GFR calc non Af Amer: 44 mL/min — ABNORMAL LOW (ref >60)
GFR calc non Af Amer: 53 mL/min — ABNORMAL LOW (ref >60)
Glucose, Bld: 540 mg/dL (ref 70–99)
Glucose, Bld: 439 mg/dL — ABNORMAL HIGH (ref 70–99)
Glucose, Bld: 102 mg/dL — ABNORMAL HIGH (ref 70–99)
Potassium: 3.9 mmol/L (ref 3.5–5.1)
Potassium: 4.2 mmol/L (ref 3.5–5.1)
Potassium: 3 mmol/L — ABNORMAL LOW (ref 3.5–5.1)
Sodium: 145 mmol/L (ref 135–145)
Sodium: 144 mmol/L (ref 135–145)
Sodium: 150 mmol/L — ABNORMAL HIGH (ref 135–145)

## 2020-04-14 LAB — GLUCOSE, CAPILLARY
Glucose-Capillary: 479 mg/dL — ABNORMAL HIGH (ref 70–99)
Glucose-Capillary: 395 mg/dL — ABNORMAL HIGH (ref 70–99)
Glucose-Capillary: 43 mg/dL — CL (ref 70–99)

## 2020-04-14 LAB — CBC WITH DIFFERENTIAL/PLATELET
Abs Immature Granulocytes: 0.05 10*3/uL (ref 0.00–0.07)
Abs Immature Granulocytes: 0.05 10*3/uL (ref 0.00–0.07)
Basophils Absolute: 0 10*3/uL (ref 0.0–0.1)
Basophils Absolute: 0 10*3/uL (ref 0.0–0.1)
Basophils Relative: 0 %
Basophils Relative: 0 %
Eosinophils Absolute: 0 10*3/uL (ref 0.0–0.5)
Eosinophils Absolute: 0 10*3/uL (ref 0.0–0.5)
Eosinophils Relative: 0 %
Eosinophils Relative: 0 %
HCT: 40.1 % (ref 39.0–52.0)
HCT: 38 % — ABNORMAL LOW (ref 39.0–52.0)
Hemoglobin: 12.7 g/dL — ABNORMAL LOW (ref 13.0–17.0)
Hemoglobin: 11.9 g/dL — ABNORMAL LOW (ref 13.0–17.0)
Immature Granulocytes: 0 %
Immature Granulocytes: 0 %
Lymphocytes Relative: 10 %
Lymphocytes Relative: 9 %
Lymphs Abs: 1.3 10*3/uL (ref 0.7–4.0)
Lymphs Abs: 1.3 10*3/uL (ref 0.7–4.0)
MCH: 29.5 pg (ref 26.0–34.0)
MCH: 29.5 pg (ref 26.0–34.0)
MCHC: 31.7 g/dL (ref 30.0–36.0)
MCHC: 31.3 g/dL (ref 30.0–36.0)
MCV: 93 fL (ref 80.0–100.0)
MCV: 94.1 fL (ref 80.0–100.0)
Monocytes Absolute: 0.9 10*3/uL (ref 0.1–1.0)
Monocytes Absolute: 1.1 10*3/uL — ABNORMAL HIGH (ref 0.1–1.0)
Monocytes Relative: 7 %
Monocytes Relative: 7 %
Neutro Abs: 10.4 10*3/uL — ABNORMAL HIGH (ref 1.7–7.7)
Neutro Abs: 12.1 10*3/uL — ABNORMAL HIGH (ref 1.7–7.7)
Neutrophils Relative %: 83 %
Neutrophils Relative %: 84 %
Platelets: 253 10*3/uL (ref 150–400)
Platelets: 247 10*3/uL (ref 150–400)
RBC: 4.31 MIL/uL (ref 4.22–5.81)
RBC: 4.04 MIL/uL — ABNORMAL LOW (ref 4.22–5.81)
RDW: 13.9 % (ref 11.5–15.5)
RDW: 13.9 % (ref 11.5–15.5)
WBC: 12.7 10*3/uL — ABNORMAL HIGH (ref 4.0–10.5)
WBC: 14.6 10*3/uL — ABNORMAL HIGH (ref 4.0–10.5)
nRBC: 0 % (ref 0.0–0.2)
nRBC: 0 % (ref 0.0–0.2)

## 2020-04-14 LAB — BLOOD GAS, ARTERIAL
Acid-Base Excess: 9.2 mmol/L — ABNORMAL HIGH (ref 0.0–2.0)
Bicarbonate: 32.4 mmol/L — ABNORMAL HIGH (ref 20.0–28.0)
Drawn by: 22223
FIO2: 21
O2 Saturation: 89.7 %
Patient temperature: 37
pCO2 arterial: 47.6 mmHg (ref 32.0–48.0)
pH, Arterial: 7.46 — ABNORMAL HIGH (ref 7.350–7.450)
pO2, Arterial: 57.5 mmHg — ABNORMAL LOW (ref 83.0–108.0)

## 2020-04-14 LAB — BLOOD GAS, VENOUS
Acid-Base Excess: 7.9 mmol/L — ABNORMAL HIGH (ref 0.0–2.0)
Acid-Base Excess: 5.1 mmol/L — ABNORMAL HIGH (ref 0.0–2.0)
Bicarbonate: 29.4 mmol/L — ABNORMAL HIGH (ref 20.0–28.0)
Bicarbonate: 27.6 mmol/L (ref 20.0–28.0)
FIO2: 28
FIO2: 32
O2 Saturation: 49.4 %
O2 Saturation: 67.3 %
Patient temperature: 37
Patient temperature: 37.2
pCO2, Ven: 60.7 mmHg — ABNORMAL HIGH (ref 44.0–60.0)
pCO2, Ven: 52.7 mmHg (ref 44.0–60.0)
pH, Ven: 7.359 (ref 7.250–7.430)
pH, Ven: 7.375 (ref 7.250–7.430)
pO2, Ven: <31 mmHg — CL (ref 32.0–45.0)
pO2, Ven: 39.1 mmHg (ref 32.0–45.0)

## 2020-04-14 LAB — SARS CORONAVIRUS 2 BY RT PCR (HOSPITAL ORDER, PERFORMED IN ~~LOC~~ HOSPITAL LAB): SARS Coronavirus 2: NEGATIVE

## 2020-04-14 LAB — LACTIC ACID, PLASMA: Lactic Acid, Venous: 2 mmol/L (ref 0.5–1.9)

## 2020-04-14 LAB — HEMOGLOBIN A1C
Hgb A1c MFr Bld: 8.9 % — ABNORMAL HIGH (ref 4.8–5.6)
Mean Plasma Glucose: 208.73 mg/dL

## 2020-04-14 LAB — MAGNESIUM: Magnesium: 2.9 mg/dL — ABNORMAL HIGH (ref 1.7–2.4)

## 2020-04-14 LAB — CBG MONITORING, ED
Glucose-Capillary: 486 mg/dL — ABNORMAL HIGH (ref 70–99)
Glucose-Capillary: 514 mg/dL (ref 70–99)

## 2020-04-14 LAB — ETHANOL: Alcohol, Ethyl (B): <10 mg/dL (ref <10)

## 2020-04-14 LAB — BETA-HYDROXYBUTYRIC ACID: Beta-Hydroxybutyric Acid: 3.71 mmol/L — ABNORMAL HIGH (ref 0.05–0.27)

## 2020-04-14 MED ORDER — ACETAMINOPHEN 650 MG RE SUPP
650.0000 mg | Freq: Four times a day (QID) | RECTAL | Status: DC | PRN
  Administered 2020-04-15 – 2020-04-16 (×2): 650 mg via RECTAL
  Filled 2020-04-14: qty 1

## 2020-04-14 MED ORDER — SODIUM CHLORIDE 0.9 % IV SOLN: INTRAVENOUS | Status: DC

## 2020-04-14 MED ORDER — ONDANSETRON HCL 4 MG/2ML IJ SOLN: 4.0000 mg | Freq: Four times a day (QID) | INTRAMUSCULAR | Status: DC | PRN

## 2020-04-14 MED ORDER — INSULIN REGULAR(HUMAN) IN NACL 100-0.9 UT/100ML-% IV SOLN: INTRAVENOUS | Status: DC

## 2020-04-14 MED ORDER — LORAZEPAM 2 MG/ML IJ SOLN
1.0000 mg | Freq: Four times a day (QID) | INTRAMUSCULAR | Status: DC | PRN
  Administered 2020-04-16 – 2020-04-17 (×4): 1 mg via INTRAVENOUS
  Filled 2020-04-14 (×4): qty 1

## 2020-04-14 MED ORDER — INSULIN GLARGINE 100 UNIT/ML ~~LOC~~ SOLN
30.0000 [IU] | Freq: Every day | SUBCUTANEOUS | Status: DC
  Filled 2020-04-14 (×2): qty 0.3

## 2020-04-14 MED ORDER — SODIUM CHLORIDE 0.9 % IV SOLN: 250.0000 mL | INTRAVENOUS | Status: DC | PRN

## 2020-04-14 MED ORDER — INSULIN ASPART 100 UNIT/ML ~~LOC~~ SOLN
4.0000 [IU] | Freq: Three times a day (TID) | SUBCUTANEOUS | Status: DC
  Administered 2020-04-14: 4 [IU] via SUBCUTANEOUS

## 2020-04-14 MED ORDER — INSULIN GLARGINE 100 UNIT/ML ~~LOC~~ SOLN
30.0000 [IU] | Freq: Once | SUBCUTANEOUS | Status: AC
  Administered 2020-04-14: 30 [IU] via SUBCUTANEOUS
  Filled 2020-04-14: qty 0.3

## 2020-04-14 MED ORDER — ONDANSETRON HCL 4 MG PO TABS: 4.0000 mg | ORAL_TABLET | Freq: Four times a day (QID) | ORAL | Status: DC | PRN

## 2020-04-14 MED ORDER — DEXTROSE-NACL 5-0.45 % IV SOLN: INTRAVENOUS | Status: DC

## 2020-04-14 MED ORDER — AEROCHAMBER PLUS FLO-VU MEDIUM MISC
1.0000 | Freq: Once | Status: AC
  Administered 2020-04-14: 1
  Filled 2020-04-14: qty 1

## 2020-04-14 MED ORDER — INSULIN ASPART 100 UNIT/ML ~~LOC~~ SOLN: 0.0000 [IU] | Freq: Three times a day (TID) | SUBCUTANEOUS | Status: DC

## 2020-04-14 MED ORDER — INSULIN ASPART 100 UNIT/ML ~~LOC~~ SOLN
22.0000 [IU] | SUBCUTANEOUS | Status: AC
  Administered 2020-04-14: 22 [IU] via SUBCUTANEOUS

## 2020-04-14 MED ORDER — CLONIDINE HCL 0.1 MG PO TABS
0.1000 mg | ORAL_TABLET | Freq: Every day | ORAL | Status: DC
  Administered 2020-04-14 – 2020-04-20 (×7): 0.1 mg via ORAL
  Filled 2020-04-14 (×8): qty 1

## 2020-04-14 MED ORDER — SODIUM CHLORIDE 0.9% FLUSH
3.0000 mL | Freq: Two times a day (BID) | INTRAVENOUS | Status: DC
  Administered 2020-04-14 – 2020-04-20 (×8): 3 mL via INTRAVENOUS

## 2020-04-14 MED ORDER — INSULIN ASPART 100 UNIT/ML ~~LOC~~ SOLN: 4.0000 [IU] | Freq: Three times a day (TID) | SUBCUTANEOUS | Status: DC

## 2020-04-14 MED ORDER — INSULIN ASPART 100 UNIT/ML ~~LOC~~ SOLN
0.0000 [IU] | Freq: Every day | SUBCUTANEOUS | Status: DC
  Administered 2020-04-15 – 2020-04-19 (×2): 2 [IU] via SUBCUTANEOUS

## 2020-04-14 MED ORDER — DEXTROSE 50 % IV SOLN
0.0000 mL | INTRAVENOUS | Status: DC | PRN
  Administered 2020-04-14: 50 mL via INTRAVENOUS
  Filled 2020-04-14: qty 50

## 2020-04-14 MED ORDER — SODIUM CHLORIDE 0.9% FLUSH
3.0000 mL | INTRAVENOUS | Status: DC | PRN
  Administered 2020-04-20: 3 mL via INTRAVENOUS

## 2020-04-14 MED ORDER — APIXABAN 5 MG PO TABS
5.0000 mg | ORAL_TABLET | Freq: Two times a day (BID) | ORAL | Status: DC
  Administered 2020-04-14 – 2020-04-20 (×11): 5 mg via ORAL
  Filled 2020-04-14 (×13): qty 1

## 2020-04-14 MED ORDER — DEXTROSE 50 % IV SOLN
INTRAVENOUS | Status: AC
  Filled 2020-04-14: qty 50

## 2020-04-14 MED ORDER — POLYETHYLENE GLYCOL 3350 17 G PO PACK: 17.0000 g | PACK | Freq: Every day | ORAL | Status: DC | PRN

## 2020-04-14 MED ORDER — POTASSIUM CHLORIDE 10 MEQ/100ML IV SOLN
10.0000 meq | INTRAVENOUS | Status: AC
  Filled 2020-04-14: qty 100

## 2020-04-14 MED ORDER — PRAVASTATIN SODIUM 40 MG PO TABS
80.0000 mg | ORAL_TABLET | Freq: Every day | ORAL | Status: DC
  Administered 2020-04-14 – 2020-04-19 (×5): 80 mg via ORAL
  Filled 2020-04-14 (×6): qty 2

## 2020-04-14 MED ORDER — HALOPERIDOL LACTATE 5 MG/ML IJ SOLN: 5.0000 mg | Freq: Four times a day (QID) | INTRAMUSCULAR | Status: DC | PRN

## 2020-04-14 MED ORDER — ALBUTEROL SULFATE (2.5 MG/3ML) 0.083% IN NEBU: 2.5000 mg | INHALATION_SOLUTION | RESPIRATORY_TRACT | Status: DC | PRN

## 2020-04-14 MED ORDER — MOMETASONE FURO-FORMOTEROL FUM 200-5 MCG/ACT IN AERO
2.0000 | INHALATION_SPRAY | Freq: Two times a day (BID) | RESPIRATORY_TRACT | Status: DC
  Administered 2020-04-14 – 2020-04-20 (×9): 2 via RESPIRATORY_TRACT
  Filled 2020-04-14: qty 8.8

## 2020-04-14 MED ORDER — ACETAMINOPHEN 325 MG PO TABS: 650.0000 mg | ORAL_TABLET | Freq: Four times a day (QID) | ORAL | Status: DC | PRN

## 2020-04-14 MED ORDER — METOPROLOL TARTRATE 25 MG PO TABS
25.0000 mg | ORAL_TABLET | Freq: Two times a day (BID) | ORAL | Status: DC
  Administered 2020-04-14 – 2020-04-15 (×2): 25 mg via ORAL
  Filled 2020-04-14 (×5): qty 1

## 2020-04-14 MED ORDER — ALBUTEROL SULFATE HFA 108 (90 BASE) MCG/ACT IN AERS
4.0000 | INHALATION_SPRAY | Freq: Once | RESPIRATORY_TRACT | Status: AC
  Administered 2020-04-14: 4 via RESPIRATORY_TRACT
  Filled 2020-04-14: qty 6.7

## 2020-04-14 MED ORDER — SODIUM CHLORIDE 0.9 % IV BOLUS
1000.0000 mL | INTRAVENOUS | Status: AC
  Administered 2020-04-14: 1000 mL via INTRAVENOUS

## 2020-04-14 MED ORDER — INSULIN ASPART 100 UNIT/ML ~~LOC~~ SOLN
0.0000 [IU] | Freq: Three times a day (TID) | SUBCUTANEOUS | Status: DC
  Administered 2020-04-14: 20 [IU] via SUBCUTANEOUS
  Administered 2020-04-15: 11 [IU] via SUBCUTANEOUS
  Administered 2020-04-15: 15 [IU] via SUBCUTANEOUS
  Administered 2020-04-15: 3 [IU] via SUBCUTANEOUS
  Administered 2020-04-16 (×2): 4 [IU] via SUBCUTANEOUS
  Administered 2020-04-17: 7 [IU] via SUBCUTANEOUS
  Administered 2020-04-18 (×2): 3 [IU] via SUBCUTANEOUS
  Administered 2020-04-19 – 2020-04-20 (×3): 4 [IU] via SUBCUTANEOUS

## 2020-04-14 NOTE — ED Provider Notes (Signed)
[facility]ERGENCY DEPARTMENT Provider Note   CSN: [id number] Arrival date & time:        History Chief Complaint  Patient presents with  . Hyperglycemia    Edward [name] is a 85 y.o. male.  HPI   Patient is a pleasant 85 year old male coming from home by paramedic transport.  Evidently the family members have reported that he has not been taking his medications, reports that his blood sugar has been high and the paramedics measured this at over 500.  The patient is not very forthcoming with information but denies any symptoms at this time other than a dry mouth.  He does have a history of COPD, he has been a prior smoker but not in 8 or 10 years, prior drinker but not currently alcohol intake.  Has history of atrial fibrillation, known diabetes, known pulmonary fibrosis and reportedly on home oxygen though he does not arrive on oxygen here.  Level 5 caveat applies secondary to the patient's limited amount of information for which she is forthcoming.  Will obtain additional history from family members if possible  Past Medical History:  Diagnosis Date  . Allergy    Rhinitis  . Atrial fibrillation ([place])   . Bronchitis   . Chronic respiratory failure ([place])   . Colon polyps   . COPD (chronic obstructive pulmonary disease) ([place])   . Diabetes mellitus   . Elevated lipids   . Hypercholesterolemia   . Hypertension   . Iron deficiency anemia due to chronic blood loss 04/30/2018  . Noncompliance   . On home O2    2L N/C   . PSA elevation   . Pulmonary fibrosis ([place])   . Vitamin D deficiency     Patient Active Problem List   Diagnosis Date Noted  . Acute metabolic encephalopathy 47/65/4650  . Altered mental status 03/23/2020  . Normocytic anemia 03/23/2020  . Mixed hyperlipidemia 03/06/2019  . Parotitis 11/01/2018  . Syncope 10/30/2018  . Facial cellulitis 10/30/2018  . Chronic respiratory failure with hypoxia ([place]) 10/30/2018  . Chronic diastolic heart failure ([place])    . Hypoxia   . Atrial fibrillation ([place])   . COPD with acute exacerbation ([place]) 07/31/2018  . Hyponatremia 07/31/2018  . COPD (chronic obstructive pulmonary disease) ([place]) 05/21/2018  . Stage 3 chronic kidney disease 05/21/2018  . Diastolic congestive heart failure ([place]) 05/21/2018  . Type 2 diabetes mellitus ([place]) 05/21/2018  . HTN (hypertension) 05/21/2018  . Iron deficiency anemia due to chronic blood loss 04/30/2018  . Respiratory distress   . Hematochezia   . Goals of care, counseling/discussion   . Palliative care by specialist   . DNR (do not resuscitate) discussion   . Acute renal failure with acute tubular necrosis superimposed on stage 3 chronic kidney disease ([place])   . Palliative care encounter   . COPD exacerbation ([place]) 04/17/2018  . Acute on chronic diastolic CHF (congestive heart failure) ([place])   . Severe anemia 02/17/2018  . Bradycardia 02/17/2018  . Hypothermia   . Gastroesophageal reflux disease   . Acute encephalopathy 09/25/2017  . On home O2 09/25/2017  . Bilateral lower extremity edema 06/27/2017  . Insomnia 03/28/2017  . CKD (chronic kidney disease), stage III 03/07/2017  . AF (paroxysmal atrial fibrillation) ([place]) 03/05/2017  . Chronic diastolic CHF (congestive heart failure) ([place]) 03/04/2017  . HCAP (healthcare-associated pneumonia) 03/04/2017  . Sepsis due to pneumonia ([place]) 02/25/2017  . Constipation 02/25/2017  . Overflow diarrhea/Constipation 02/25/2017  . DM type  2 causing vascular disease ([place]) 10/11/2016  . Non compliance w medication regimen 12/02/2015  . Hypercholesterolemia 05/12/2014  . Acute on chronic respiratory failure with hypoxemia ([place]) 11/17/2013  . Elevated PSA 01/20/2013  . Hypertension   . Elevated lipids   . Pulmonary fibrosis ([place])   . Colon polyps   . Colon polyps     Past Surgical History:  Procedure Laterality Date  . BIOPSY  04/23/2018   Procedure: BIOPSY;  Surgeon: Danie Binder, MD;  Location: [facility];   Service: Endoscopy;;  duodenum gastric  . CATARACT EXTRACTION W/PHACO  06/25/2012   Procedure: CATARACT EXTRACTION PHACO AND INTRAOCULAR LENS PLACEMENT (IOC);  Surgeon: Elta Guadeloupe T. Gershon Crane, MD;  Location: [facility];  Service: Ophthalmology;  Laterality: Left;  CDE=19.01  . CATARACT EXTRACTION W/PHACO  07/09/2012   Procedure: CATARACT EXTRACTION PHACO AND INTRAOCULAR LENS PLACEMENT (IOC);  Surgeon: Elta Guadeloupe T. Gershon Crane, MD;  Location: [facility];  Service: Ophthalmology;  Laterality: Right;  CDE: 20.09  . COLONOSCOPY WITH PROPOFOL N/A 04/23/2018   Procedure: COLONOSCOPY WITH PROPOFOL;  Surgeon: Danie Binder, MD;  Location: [facility];  Service: Endoscopy;  Laterality: N/A;  . ESOPHAGOGASTRODUODENOSCOPY (EGD) WITH PROPOFOL N/A 04/23/2018   Procedure: ESOPHAGOGASTRODUODENOSCOPY (EGD) WITH PROPOFOL;  Surgeon: Danie Binder, MD;  Location: [facility];  Service: Endoscopy;  Laterality: N/A;       Family History  Problem Relation Age of Onset  . Heart disease Mother   . CAD Other   . Diabetes Other     Social History   Tobacco Use  . Smoking status: Former Smoker    Packs/day: 1.50    Years: 60.00    Pack years: 90.00    Types: Cigarettes    Quit date: 12/31/2012    Years since quitting: 7.2  . Smokeless tobacco: Never Used  Vaping Use  . Vaping Use: Never used  Substance Use Topics  . Alcohol use: No  . Drug use: No    Home Medications Prior to Admission medications   Medication Sig Start Date End Date Taking? Authorizing Provider  acetaminophen (TYLENOL) 500 MG tablet Take 500 mg by mouth every 6 (six) hours as needed for headache.   Yes [provider]  albuterol (PROVENTIL) (2.5 MG/3ML) 0.083% nebulizer solution INHALE 1 VIAL VIA NEBULIZER EVERY 6 HOURS AS NEEDED FOR WHEEZING OR SHORTNESS OF BREATH 08/12/19  Yes Susy Frizzle, MD  budesonide-formoterol (SYMBICORT) 160-4.5 MCG/ACT inhaler Inhale 2 puffs into the lungs 2 (two) times daily. 01/13/20  Yes Susy Frizzle, MD   cloNIDine (CATAPRES) 0.1 MG tablet Take 1 tablet (0.1 mg total) by mouth daily. 03/25/20  Yes Barton Dubois, MD  ELIQUIS 5 MG TABS tablet Take 1 tablet by mouth twice daily 03/05/20  Yes Susy Frizzle, MD  furosemide (LASIX) 40 MG tablet TAKE 1 TABLET BY MOUTH DAILY 03/24/20  Yes Susy Frizzle, MD  Insulin Glargine (BASAGLAR KWIKPEN) 100 UNIT/ML SOPN Inject 0.3 mLs (30 Units total) into the skin at bedtime. 07/21/19  Yes Susy Frizzle, MD  ipratropium-albuterol (DUONEB) 0.5-2.5 (3) MG/3ML SOLN Take 3 mLs by nebulization every 6 (six) hours as needed. 12/30/19  Yes Susy Frizzle, MD  metFORMIN (GLUCOPHAGE) 1000 MG tablet Take 1 tablet (1,000 mg total) by mouth 2 (two) times daily with a meal. Patient taking differently: Take 500 mg by mouth 2 (two) times daily with a meal.  02/06/20  Yes Susy Frizzle, MD  metoprolol tartrate (LOPRESSOR) 25 MG  tablet TAKE 1 TABLET BY MOUTH TWICE DAILY 03/24/20  Yes Susy Frizzle, MD  OXYGEN Inhale 3 L into the lungs continuous.    Yes [provider]  potassium chloride SA (KLOR-CON) 20 MEQ tablet Take 1 tablet (20 mEq total) by mouth 2 (two) times daily. 03/25/20  Yes Barton Dubois, MD  pravastatin (PRAVACHOL) 80 MG tablet TAKE 1 TABLET BY MOUTH AT BEDTIME 11/17/19  Yes Susy Frizzle, MD  pregabalin (LYRICA) 100 MG capsule Take 1 capsule (100 mg total) by mouth 2 (two) times daily. 03/25/20  Yes Barton Dubois, MD    Allergies    Ace inhibitors  Review of Systems   Review of Systems  Unable to perform ROS: Other    Physical Exam Updated Vital Signs BP (!) 144/83   Pulse (!) 106   Temp 98 F (36.7 C)   Resp 18   Wt 77.1 kg   SpO2 100%   BMI 28.29 kg/m   Physical Exam Vitals and nursing note reviewed.  Constitutional:      General: He is not in acute distress.    Appearance: He is well-developed.  HENT:     Head: Normocephalic and atraumatic.     Mouth/Throat:     Mouth: Mucous membranes are dry.     Pharynx: No  oropharyngeal exudate.  Eyes:     General: No scleral icterus.       Right eye: No discharge.        Left eye: No discharge.     Conjunctiva/sclera: Conjunctivae normal.     Pupils: Pupils are equal, round, and reactive to light.  Neck:     Thyroid: No thyromegaly.     Vascular: No JVD.  Cardiovascular:     Rate and Rhythm: Regular rhythm. Tachycardia present.     Heart sounds: Normal heart sounds. No murmur heard.  No friction rub. No gallop.   Pulmonary:     Effort: Pulmonary effort is normal. No respiratory distress.     Breath sounds: Wheezing present. No rales.     Comments: The patient has some expiratory wheezing but no distress and speaks in full sentences Abdominal:     General: Bowel sounds are normal. There is no distension.     Palpations: Abdomen is soft. There is no mass.     Tenderness: There is no abdominal tenderness.  Musculoskeletal:        General: No tenderness. Normal range of motion.     Cervical back: Normal range of motion and neck supple.  Lymphadenopathy:     Cervical: No cervical adenopathy.  Skin:    General: Skin is warm and dry.     Findings: No erythema or rash.  Neurological:     Mental Status: He is alert.     Coordination: Coordination normal.  Psychiatric:        Behavior: Behavior normal.     ED Results / Procedures / Treatments   Labs (all labs ordered are listed, but only abnormal results are displayed) Labs Reviewed  CBC WITH DIFFERENTIAL/PLATELET - Abnormal; Notable for the following components:      Result Value   WBC 12.7 (*)    Hemoglobin 12.7 (*)    Neutro Abs 10.4 (*)    All other components within normal limits  MAGNESIUM - Abnormal; Notable for the following components:   Magnesium 2.9 (*)    All other components within normal limits  COMPREHENSIVE METABOLIC PANEL - Abnormal; Notable for the following  components:   Chloride 97 (*)    Glucose, Bld 511 (*)    BUN 47 (*)    Creatinine, Ser 1.49 (*)    Total Protein  8.2 (*)    AST 11 (*)    GFR calc non Af Amer 43 (*)    GFR calc Af Amer 50 (*)    Anion gap 18 (*)    All other components within normal limits  LACTIC ACID, PLASMA - Abnormal; Notable for the following components:   Lactic Acid, Venous 2.0 (*)    All other components within normal limits  BLOOD GAS, VENOUS - Abnormal; Notable for the following components:   pCO2, Ven 60.7 (*)    pO2, Ven <31.0 (*)    Bicarbonate 29.4 (*)    Acid-Base Excess 7.9 (*)    All other components within normal limits  CBG MONITORING, ED - Abnormal; Notable for the following components:   Glucose-Capillary 486 (*)    All other components within normal limits  SARS CORONAVIRUS 2 BY RT PCR (HOSPITAL ORDER, [place] [facility])  ETHANOL  URINALYSIS, ROUTINE W REFLEX MICROSCOPIC  BASIC METABOLIC PANEL  BASIC METABOLIC PANEL  BASIC METABOLIC PANEL  BASIC METABOLIC PANEL  BETA-HYDROXYBUTYRIC ACID  BETA-HYDROXYBUTYRIC ACID  CBC WITH DIFFERENTIAL/PLATELET  URINALYSIS, ROUTINE W REFLEX MICROSCOPIC  BLOOD GAS, VENOUS  CBG MONITORING, ED    EKG None  Radiology DG Chest Port 1 View  Result Date: 04/14/2020 CLINICAL DATA:  Shortness of breath EXAM: PORTABLE CHEST 1 VIEW COMPARISON:  03/23/2020 FINDINGS: Mild interstitial prominence throughout the lungs, likely chronic. Heart and mediastinal contours are within normal limits. No focal opacities or effusions. No acute bony abnormality. IMPRESSION: No active disease. Electronically Signed   By: Rolm Baptise M.D.   On: 04/14/2020 09:57    Procedures .Critical Care Performed by: Noemi Chapel, MD Authorized by: Noemi Chapel, MD   Critical care provider statement:    Critical care time (minutes):  35   Critical care time was exclusive of:  Separately billable procedures and treating other patients and teaching time   Critical care was necessary to treat or prevent imminent or life-threatening deterioration of the following conditions:  CNS  failure or compromise and endocrine crisis   Critical care was time spent personally by me on the following activities:  Blood draw for specimens, development of treatment plan with patient or surrogate, discussions with consultants, evaluation of patient's response to treatment, examination of patient, obtaining history from patient or surrogate, ordering and performing treatments and interventions, ordering and review of laboratory studies, ordering and review of radiographic studies, pulse oximetry, re-evaluation of patient's condition and review of old charts Comments:         (including critical care time)  Medications Ordered in ED Medications  0.9 %  sodium chloride infusion ( Intravenous New Bag/Given 04/14/20 0939)  insulin regular, human (MYXREDLIN) 100 units/ 100 mL infusion (has no administration in time range)  0.9 %  sodium chloride infusion (has no administration in time range)  dextrose 5 %-0.45 % sodium chloride infusion (has no administration in time range)  dextrose 50 % solution 0-50 mL (has no administration in time range)  sodium chloride 0.9 % bolus 1,000 mL (has no administration in time range)  potassium chloride 10 mEq in 100 mL IVPB (has no administration in time range)  albuterol (VENTOLIN HFA) 108 (90 Base) MCG/ACT inhaler 4 puff (4 puffs Inhalation Given 04/14/20 0939)  AeroChamber Plus Flo-Vu Medium  MISC 1 each (1 each Other Given 04/14/20 0940)    ED Course  I have reviewed the triage vital signs and the nursing notes.  Pertinent labs & imaging results that were available during my care of the patient were reviewed by me and considered in my medical decision making (see chart for details).    MDM Rules/Calculators/A&P                          The patient is a little tachycardic, he does have a dry mouth, he is reportedly hyperglycemic which we will confirm.  There is some concern that if he has been noncompliant with his medications that me may very well  have some endorgan injury or there may be an infection or underlying process which is driving this confusion.  He is not combative or disagreeable at this time and is willing to have testing as needed as well as stabilizing care.  Will discuss with family to get further information.  According to the medical record the patient had been admitted to the hospital on June 22 and discharged on June 24 after having altered mental status.  Seem to improve and was discharged home  I spoke with the son, Mr. Wilkes Potvin who lives with the patient, he reports that he has not been himself for the last couple of days laying around, not getting up, not wanting to eat or drink or take his medications for the last couple of days.  There has been no reported fevers vomiting or diarrhea, no difficulty breathing.  Blood sugar was too high to read yesterday, this morning it also seemed very high over 500.  Reportedly he was told by his family doctor's office to just come to the hospital instead of going into the clinic where he had an appointment tomorrow.  Discussed with the hospitalist for admission, the patient has been started on insulin, he has hypernatremic, he is dehydrated, he is persistently mildly somnolent.  He is mildly tachycardic and needing IV fluids.  Critical services provided, see the notes above.  Final Clinical Impression(s) / ED Diagnoses Final diagnoses:  Hyperglycemia  Acute encephalopathy  Hypernatremia    Rx / DC Orders ED Discharge Orders    None       Noemi Chapel, MD 04/14/20 1134

## 2020-04-14 NOTE — ED Notes (Signed)
Date and time results received: 04/14/20 12:08 PM  (use smartphrase ".now" to insert current time)  Test: glucose Critical Value: 540  Name of Provider Notified: Courage MD  Orders Received? Or Actions Taken?: na

## 2020-04-14 NOTE — H&P (Signed)
Patient Demographics:    Edward [name], is a 85 y.o. male  MRN: [id number]   DOB - March 27, 1935  Admit Date - 04/14/2020  Outpatient Primary MD for the patient is Susy Frizzle, MD   Assessment & Plan:    Active Problems:   Acute metabolic encephalopathy    1) acute metabolic encephalopathy in the setting of hyperglycemia--continue to hydrate, continue to monitor sugar levels -UA pending Leukocytosis white count of 14.6 noted-may be reactive, -Cultures pending -Hold Lyrica -Use Haldol and lorazepam as needed agitation  2) chronic hypoxic respiratory failure--continue supplemental oxygen-check ABG to rule out hypercapnia as part of etiology for altered mentation  3)AKI--due to dehydration and hypoglycemia, baseline creatinine is usually between 0.9 and 1, creatinine is 1.56  , renally adjust medications, avoid nephrotoxic agents / dehydration  / hypotension -Hold Lasix, -Hold Metformin  4)PAFIb--stable, continue metoprolol for rate control, continue Eliquis for stroke prophylaxis  5)COPD-no acute exacerbation at this time, continue bronchodilators, ABG pending  6) uncontrolled DM--A1c 8.9, insulin and IV fluids as ordered -Hold Metformin due to dehydration and AKI   Disposition/Need for in-Hospital Stay- patient unable to be discharged at this time due to --- Home with resolution of metabolic encephalopathy,, currently requiring IV fluids  Dispo: The patient is from: Home              Anticipated d/c is to: Home with HH Vs SNF              Anticipated d/c date is: 1 day              Patient currently is not medically stable to d/c. Barriers: Not Clinically Stable- - Home with resolution of metabolic encephalopathy,, currently requiring IV fluids  With History of - Reviewed by me  Past Medical  History:  Diagnosis Date  . Allergy    Rhinitis  . Atrial fibrillation ([place])   . Bronchitis   . Chronic respiratory failure ([place])   . Colon polyps   . COPD (chronic obstructive pulmonary disease) ([place])   . Diabetes mellitus   . Elevated lipids   . Hypercholesterolemia   . Hypertension   . Iron deficiency anemia due to chronic blood loss 04/30/2018  . Noncompliance   . On home O2    2L N/C   . PSA elevation   . Pulmonary fibrosis ([place])   . Vitamin D deficiency       Past Surgical History:  Procedure Laterality Date  . BIOPSY  04/23/2018   Procedure: BIOPSY;  Surgeon: Danie Binder, MD;  Location: [facility];  Service: Endoscopy;;  duodenum gastric  . CATARACT EXTRACTION W/PHACO  06/25/2012   Procedure: CATARACT EXTRACTION PHACO AND INTRAOCULAR LENS PLACEMENT (IOC);  Surgeon: Elta Guadeloupe T. Gershon Crane, MD;  Location: [facility];  Service: Ophthalmology;  Laterality: Left;  CDE=19.01  . CATARACT EXTRACTION W/PHACO  07/09/2012   Procedure: CATARACT EXTRACTION PHACO AND INTRAOCULAR LENS PLACEMENT (IOC);  Surgeon: Elta Guadeloupe T. Gershon Crane, MD;  Location: [facility];  Service: Ophthalmology;  Laterality: Right;  CDE: 20.09  . COLONOSCOPY WITH PROPOFOL N/A 04/23/2018   Procedure: COLONOSCOPY WITH PROPOFOL;  Surgeon: Danie Binder, MD;  Location: [facility];  Service: Endoscopy;  Laterality: N/A;  . ESOPHAGOGASTRODUODENOSCOPY (EGD) WITH PROPOFOL N/A 04/23/2018   Procedure: ESOPHAGOGASTRODUODENOSCOPY (EGD) WITH PROPOFOL;  Surgeon: Danie Binder, MD;  Location: [facility];  Service: Endoscopy;  Laterality: N/A;      Chief Complaint  Patient presents with  . Hyperglycemia      HPI:    Edward [name]  is a 85 y.o. male  history significant for  PAF (atrial fibrillation),pulmonary fibrosis,COPDon home oxygen,type IIdiabetes mellitus, hyperlipidemia, hypertension, iron deficiency anemia,history of noncompliance, PSA elevation,vitamin D ongoing hypoglycemia on documentable encephalopathy on  04/14/2020  -Patient is very confused and a poor historian -Patient son tells me that patient's blood sugar readings have been reading high which means over 600 for the last couple of days -Patient's wife tells me that patient has been refusing to take insulin for the last couple days -In the ED his bicarb is 28 anion gap is 18 glucose over 500 -Patient received IV fluids and anion gap is down to 14--he remains confused and disoriented -Apparently no vomiting no diarrhea no fevers no chest pain    Review of systems:    In addition to the HPI above,   A full Review of  Systems was done, all other systems reviewed are negative except as noted above in HPI , .    Social History:  Reviewed by me    Social History   Tobacco Use  . Smoking status: Former Smoker    Packs/day: 1.50    Years: 60.00    Pack years: 90.00    Types: Cigarettes    Quit date: 12/31/2012    Years since quitting: 7.2  . Smokeless tobacco: Never Used  Substance Use Topics  . Alcohol use: No       Family History :  Reviewed by me  Family History  Problem Relation Age of Onset  . Heart disease Mother   . CAD Other   . Diabetes Other      Home Medications:   Prior to Admission medications   Medication Sig Start Date End Date Taking? Authorizing Provider  acetaminophen (TYLENOL) 500 MG tablet Take 500 mg by mouth every 6 (six) hours as needed for headache.   Yes [provider]  albuterol (PROVENTIL) (2.5 MG/3ML) 0.083% nebulizer solution INHALE 1 VIAL VIA NEBULIZER EVERY 6 HOURS AS NEEDED FOR WHEEZING OR SHORTNESS OF BREATH 08/12/19  Yes Susy Frizzle, MD  budesonide-formoterol (SYMBICORT) 160-4.5 MCG/ACT inhaler Inhale 2 puffs into the lungs 2 (two) times daily. 01/13/20  Yes Susy Frizzle, MD  cloNIDine (CATAPRES) 0.1 MG tablet Take 1 tablet (0.1 mg total) by mouth daily. 03/25/20  Yes Barton Dubois, MD  ELIQUIS 5 MG TABS tablet Take 1 tablet by mouth twice daily 03/05/20  Yes Susy Frizzle, MD  furosemide (LASIX) 40 MG tablet TAKE 1 TABLET BY MOUTH DAILY 03/24/20  Yes Susy Frizzle, MD  Insulin Glargine (BASAGLAR KWIKPEN) 100 UNIT/ML SOPN Inject 0.3 mLs (30 Units total) into the skin at bedtime. 07/21/19  Yes Susy Frizzle, MD  ipratropium-albuterol (DUONEB) 0.5-2.5 (3) MG/3ML SOLN Take 3 mLs by nebulization every 6 (six) hours as needed. 12/30/19  Yes Susy Frizzle, MD  metFORMIN (GLUCOPHAGE) 1000 MG tablet  Take 1 tablet (1,000 mg total) by mouth 2 (two) times daily with a meal. Patient taking differently: Take 500 mg by mouth 2 (two) times daily with a meal.  02/06/20  Yes Susy Frizzle, MD  metoprolol tartrate (LOPRESSOR) 25 MG tablet TAKE 1 TABLET BY MOUTH TWICE DAILY 03/24/20  Yes Susy Frizzle, MD  OXYGEN Inhale 3 L into the lungs continuous.    Yes [provider]  potassium chloride SA (KLOR-CON) 20 MEQ tablet Take 1 tablet (20 mEq total) by mouth 2 (two) times daily. 03/25/20  Yes Barton Dubois, MD  pravastatin (PRAVACHOL) 80 MG tablet TAKE 1 TABLET BY MOUTH AT BEDTIME 11/17/19  Yes Susy Frizzle, MD  pregabalin (LYRICA) 100 MG capsule Take 1 capsule (100 mg total) by mouth 2 (two) times daily. 03/25/20  Yes Barton Dubois, MD     Allergies:     Allergies  Allergen Reactions  . Ace Inhibitors Other (See Comments)    Hyperkalemia--07/23/2013:patient states not familiar with the following allergy     Physical Exam:   Vitals  Blood pressure (!) 176/89, pulse (!) 103, temperature 98.4 F (36.9 C), temperature source Oral, resp. rate 19, weight 77.1 kg, SpO2 100 %.  Physical Examination: General appearance -confused and disoriented  mental status -remains encephalopathic Nose- [place] 2L/min Eyes - sclera anicteric Neck - supple, no JVD elevation , Chest - clear  to auscultation bilaterally, symmetrical air movement,  Heart - S1 and S2 normal, regular  Abdomen - soft, nontender, nondistended,  NeuroPsych--neuro exam is limited due  to metabolic encephalopathy/altered mentation Extremities - no pedal edema noted, intact peripheral pulses  Skin - warm, dry    Data Review:    CBC Recent Labs  Lab 04/14/20 0935 04/14/20 1146  WBC 12.7* 14.6*  HGB 12.7* 11.9*  HCT 40.1 38.0*  PLT 253 247  MCV 93.0 94.1  MCH 29.5 29.5  MCHC 31.7 31.3  RDW 13.9 13.9  LYMPHSABS 1.3 1.3  MONOABS 0.9 1.1*  EOSABS 0.0 0.0  BASOSABS 0.0 0.0   ------------------------------------------------------------------------------------------------------------------  Chemistries  Recent Labs  Lab 04/14/20 0935 04/14/20 1146 04/14/20 1524  NA 144 145 144  K 4.2 3.9 4.2  CL 97* 99 106  CO2 29 28 24   GLUCOSE 511* 540* 439*  BUN 47* 48* 44*  CREATININE 1.49* 1.56* 1.47*  CALCIUM 9.6 9.4 8.6*  MG 2.9*  --   --   AST 11*  --   --   ALT 14  --   --   ALKPHOS 90  --   --   BILITOT 0.8  --   --    ------------------------------------------------------------------------------------------------------------------ estimated creatinine clearance is 37.1 mL/min (A) (by C-G formula based on SCr of 1.47 mg/dL (H)). ------------------------------------------------------------------------------------------------------------------ No results for input(s): TSH, T4TOTAL, T3FREE, THYROIDAB in the last 72 hours.  Invalid input(s): FREET3   Coagulation profile No results for input(s): INR, PROTIME in the last 168 hours. ------------------------------------------------------------------------------------------------------------------- No results for input(s): DDIMER in the last 72 hours. -------------------------------------------------------------------------------------------------------------------  Cardiac Enzymes No results for input(s): CKMB, TROPONINI, MYOGLOBIN in the last 168 hours.  Invalid input(s): CK ------------------------------------------------------------------------------------------------------------------    Component  Value Date/Time   BNP 298.0 (H) 03/23/2020 1705   BNP 101 (H) 08/19/2018 1015     ---------------------------------------------------------------------------------------------------------------  Urinalysis    Component Value Date/Time   COLORURINE YELLOW 03/23/2020 1815   APPEARANCEUR CLEAR 03/23/2020 1815   LABSPEC 1.015 03/23/2020 1815   PHURINE 7.0 03/23/2020 1815   GLUCOSEU >=500 (A)  03/23/2020 1815   HGBUR SMALL (A) 03/23/2020 1815   BILIRUBINUR NEGATIVE 03/23/2020 1815   KETONESUR NEGATIVE 03/23/2020 1815   PROTEINUR >=300 (A) 03/23/2020 1815   NITRITE NEGATIVE 03/23/2020 1815   LEUKOCYTESUR NEGATIVE 03/23/2020 1815    ----------------------------------------------------------------------------------------------------------------   Imaging Results:    DG Chest Port 1 View  Result Date: 04/14/2020 CLINICAL DATA:  Shortness of breath EXAM: PORTABLE CHEST 1 VIEW COMPARISON:  03/23/2020 FINDINGS: Mild interstitial prominence throughout the lungs, likely chronic. Heart and mediastinal contours are within normal limits. No focal opacities or effusions. No acute bony abnormality. IMPRESSION: No active disease. Electronically Signed   By: Rolm Baptise M.D.   On: 04/14/2020 09:57    Radiological Exams on Admission: DG Chest Port 1 View  Result Date: 04/14/2020 CLINICAL DATA:  Shortness of breath EXAM: PORTABLE CHEST 1 VIEW COMPARISON:  03/23/2020 FINDINGS: Mild interstitial prominence throughout the lungs, likely chronic. Heart and mediastinal contours are within normal limits. No focal opacities or effusions. No acute bony abnormality. IMPRESSION: No active disease. Electronically Signed   By: Rolm Baptise M.D.   On: 04/14/2020 09:57    DVT Prophylaxis -SCD /eliquis AM Labs Ordered, also please review Full Orders  Family Communication: Admission, patients condition and plan of care including tests being ordered have been discussed with the patient and son and wife who indicate  understanding and agree with the plan   Code Status - Full Code  Likely DC to  home  Condition   -fair  Roxan Hockey M.D on 04/14/2020 at 7:38 PM Go to [email or web address] -  for contact info  [facility] - Office  [phone]

## 2020-04-14 NOTE — ED Notes (Signed)
Advised patient we needed urine specimen.  Urinal left at bedside.  °

## 2020-04-14 NOTE — ED Notes (Signed)
Date and time results received: 04/14/20 1000 (use smartphrase ".now" to insert current time)  Test: lactic acid Critical Value: 2.0   Test: glucose Critical Value: 511   Name of Provider Notified: Sabra Heck, MD   Orders Received? Or Actions Taken?: n/a

## 2020-04-14 NOTE — ED Notes (Addendum)
Pt found sitting in chair with IV removed, EKG leads removed, oxygen tubing removed. Urine and blood in floor. Pt unable to recall why or how he got out of bed and into chair.   Changed pt. Moved to a room close to nurses station to ensure pt safety. IV restarted.

## 2020-04-14 NOTE — ED Triage Notes (Addendum)
Per Ems- pt has been refusing to take insulin and BP meds. Pt has cough upon arrival to ED, hx of COPD.   Pt denies any complain. Family called EMS due to noncompliance with med regimen.   Per EMS, pt glucose over 500.

## 2020-04-14 NOTE — ED Notes (Signed)
CRITICAL VALUE ALERT  Critical Value:  PO2 less than 31  Date & Time Notied:  04/14/20 946  Provider Notified: Dr. Sabra Heck  Orders Received/Actions taken: See new orders

## 2020-04-14 NOTE — Progress Notes (Signed)
Patient's blood sugar 43. Given 1 amp of D50. Blood sugar increased to 104. Dr Carolynne Edouard made aware. New order to hold 30 units of lantus and Q1H CBG X2

## 2020-04-15 ENCOUNTER — Ambulatory Visit: Payer: Medicare Other | Admitting: Family Medicine

## 2020-04-15 DIAGNOSIS — G9341 Metabolic encephalopathy: Secondary | ICD-10-CM | POA: Diagnosis not present

## 2020-04-15 LAB — GLUCOSE, CAPILLARY
Glucose-Capillary: 104 mg/dL — ABNORMAL HIGH (ref 70–99)
Glucose-Capillary: 109 mg/dL — ABNORMAL HIGH (ref 70–99)
Glucose-Capillary: 148 mg/dL — ABNORMAL HIGH (ref 70–99)
Glucose-Capillary: 228 mg/dL — ABNORMAL HIGH (ref 70–99)
Glucose-Capillary: 281 mg/dL — ABNORMAL HIGH (ref 70–99)
Glucose-Capillary: 303 mg/dL — ABNORMAL HIGH (ref 70–99)
Glucose-Capillary: 93 mg/dL (ref 70–99)

## 2020-04-15 LAB — BASIC METABOLIC PANEL
Anion gap: 12 (ref 5–15)
Anion gap: 12 (ref 5–15)
Anion gap: 14 (ref 5–15)
BUN: 40 mg/dL — ABNORMAL HIGH (ref 8–23)
BUN: 41 mg/dL — ABNORMAL HIGH (ref 8–23)
BUN: 42 mg/dL — ABNORMAL HIGH (ref 8–23)
CO2: 27 mmol/L (ref 22–32)
CO2: 28 mmol/L (ref 22–32)
CO2: 29 mmol/L (ref 22–32)
Calcium: 9 mg/dL (ref 8.9–10.3)
Calcium: 9 mg/dL (ref 8.9–10.3)
Calcium: 9.2 mg/dL (ref 8.9–10.3)
Chloride: 109 mmol/L (ref 98–111)
Chloride: 109 mmol/L (ref 98–111)
Chloride: 109 mmol/L (ref 98–111)
Creatinine, Ser: 1.22 mg/dL (ref 0.61–1.24)
Creatinine, Ser: 1.28 mg/dL — ABNORMAL HIGH (ref 0.61–1.24)
Creatinine, Ser: 1.3 mg/dL — ABNORMAL HIGH (ref 0.61–1.24)
GFR calc Af Amer: 59 mL/min — ABNORMAL LOW (ref >60)
GFR calc Af Amer: >60 mL/min (ref >60)
GFR calc Af Amer: >60 mL/min (ref >60)
GFR calc non Af Amer: 51 mL/min — ABNORMAL LOW (ref >60)
GFR calc non Af Amer: 52 mL/min — ABNORMAL LOW (ref >60)
GFR calc non Af Amer: 55 mL/min — ABNORMAL LOW (ref >60)
Glucose, Bld: 175 mg/dL — ABNORMAL HIGH (ref 70–99)
Glucose, Bld: 286 mg/dL — ABNORMAL HIGH (ref 70–99)
Glucose, Bld: 95 mg/dL (ref 70–99)
Potassium: 3.3 mmol/L — ABNORMAL LOW (ref 3.5–5.1)
Potassium: 3.3 mmol/L — ABNORMAL LOW (ref 3.5–5.1)
Potassium: 3.4 mmol/L — ABNORMAL LOW (ref 3.5–5.1)
Sodium: 149 mmol/L — ABNORMAL HIGH (ref 135–145)
Sodium: 150 mmol/L — ABNORMAL HIGH (ref 135–145)
Sodium: 150 mmol/L — ABNORMAL HIGH (ref 135–145)

## 2020-04-15 LAB — CBC
HCT: 35.1 % — ABNORMAL LOW (ref 39.0–52.0)
Hemoglobin: 11.2 g/dL — ABNORMAL LOW (ref 13.0–17.0)
MCH: 29.9 pg (ref 26.0–34.0)
MCHC: 31.9 g/dL (ref 30.0–36.0)
MCV: 93.9 fL (ref 80.0–100.0)
Platelets: 263 10*3/uL (ref 150–400)
RBC: 3.74 MIL/uL — ABNORMAL LOW (ref 4.22–5.81)
RDW: 14 % (ref 11.5–15.5)
WBC: 15.7 10*3/uL — ABNORMAL HIGH (ref 4.0–10.5)
nRBC: 0 % (ref 0.0–0.2)

## 2020-04-15 MED ORDER — POTASSIUM CHLORIDE CRYS ER 20 MEQ PO TBCR
40.0000 meq | EXTENDED_RELEASE_TABLET | Freq: Once | ORAL | Status: AC
  Administered 2020-04-15: 40 meq via ORAL
  Filled 2020-04-15: qty 2

## 2020-04-15 MED ORDER — THIAMINE HCL 100 MG PO TABS
100.0000 mg | ORAL_TABLET | Freq: Every day | ORAL | Status: DC
  Administered 2020-04-17 – 2020-04-20 (×4): 100 mg via ORAL
  Filled 2020-04-15 (×6): qty 1

## 2020-04-15 MED ORDER — FOLIC ACID 1 MG PO TABS
1.0000 mg | ORAL_TABLET | Freq: Every day | ORAL | Status: DC
  Administered 2020-04-16 – 2020-04-20 (×5): 1 mg via ORAL
  Filled 2020-04-15 (×6): qty 1

## 2020-04-15 MED ORDER — LORAZEPAM 2 MG/ML IJ SOLN
1.0000 mg | Freq: Once | INTRAMUSCULAR | Status: AC
  Administered 2020-04-15: 1 mg via INTRAVENOUS
  Filled 2020-04-15: qty 1

## 2020-04-15 MED ORDER — INSULIN GLARGINE 100 UNIT/ML ~~LOC~~ SOLN
20.0000 [IU] | Freq: Every day | SUBCUTANEOUS | Status: DC
  Administered 2020-04-15 – 2020-04-19 (×5): 20 [IU] via SUBCUTANEOUS
  Filled 2020-04-15 (×6): qty 0.2

## 2020-04-15 MED ORDER — INSULIN ASPART 100 UNIT/ML ~~LOC~~ SOLN
3.0000 [IU] | Freq: Three times a day (TID) | SUBCUTANEOUS | Status: DC
  Administered 2020-04-16 – 2020-04-19 (×9): 3 [IU] via SUBCUTANEOUS

## 2020-04-15 MED ORDER — METOPROLOL TARTRATE 5 MG/5ML IV SOLN
2.5000 mg | Freq: Four times a day (QID) | INTRAVENOUS | Status: DC | PRN
  Administered 2020-04-15: 2.5 mg via INTRAVENOUS
  Filled 2020-04-15: qty 5

## 2020-04-15 MED ORDER — SODIUM CHLORIDE 0.45 % IV SOLN: INTRAVENOUS | Status: DC

## 2020-04-15 NOTE — Progress Notes (Signed)
Patient Demographics:    Edward [name], is a 85 y.o. male, DOB - [birth year], PNP:[id number]  Admit date - 04/14/2020   Admitting Physician Edward Waskey Denton Brick, MD  Outpatient Primary MD for the patient is [name], [name], MD  LOS - 0  Chief Complaint  Patient presents with  . Hyperglycemia        Subjective:    Edward [name] today has no fevers, no emesis,  No chest pain,   -Had episode of hypoglycemia -Remains confused and disoriented, son and wife at bedside,  --According to patient son and wife   at bedside this is not patient's baseline from a cognitive and mentation standpoint --at baseline   patient apparently is usually coherent alert and quite with it usually  Assessment  & Plan :    Active Problems:   Acute metabolic encephalopathy   1) acute metabolic encephalopathy in the setting of hyperglycemia--continue to hydrate, continue to monitor sugar levels -UA pending Leukocytosis white count up to 15.7 from 12.7 -Blood cultures NGTD --Hold Lyrica -Use Haldol and lorazepam as needed agitation --According to patient son and wife   at bedside this is not patient's baseline from a cognitive and mentation standpoint --at baseline   patient apparently is usually coherent alert and quite with it usually  2) chronic hypoxic respiratory failure--continue supplemental oxygen- -- ABG w/o hypercapnia   -Clinically and radiologically no pneumonia   3)AKI--due to dehydration and hyperglycemia, baseline creatinine is usually between 0.9 and 1, creatinine is 1.56 -Creatinine is down to 1.30  , renally adjust medications, avoid nephrotoxic agents / dehydration  / hypotension -Hold Lasix, -Hold Metformin  4)PAFIb--stable, continue metoprolol for rate control, continue Eliquis for stroke prophylaxis  5)COPD-no acute exacerbation at this time, continue bronchodilators,   6) uncontrolled DM--A1c 8.9,  decrease Lantus insulin to 20 units from 30 units due to episodes of hypoglycemia  insulin and IV fluids as ordered -Hold Metformin due to dehydration and AKI   7)HyperNatremia--may be due to IV fluids with normal saline--free water deficit -Change IV fluids to half-normal saline  Disposition/Need for in-Hospital Stay- patient unable to be discharged at this time due to --- Home with resolution of metabolic encephalopathy,, currently requiring IV fluids  Dispo: The patient is from: Home  Anticipated d/c is to: Home with HH Vs SNF  Anticipated d/c date is: 1 day  Patient currently is not medically stable to d/c. Barriers: Not Clinically Stable- - Home with resolution of metabolic encephalopathy,, currently requiring IV fluids -Metabolic encephalopathy requiring further investigation and IV fluids -According to patient son and wife   at bedside this is not patient's baseline from a cognitive and mentation standpoint --at baseline   patient apparently is usually coherent alert and quite with it usually  Status is: Inpatient  Remains inpatient appropriate because:Metabolic encephalopathy requiring further investigation and IV fluids  Code Status : Full  Family Communication:    Discussed with patient's wife and son at bedside Consults  :  na DVT Prophylaxis  : Eliquis Lab Results  Component Value Date   PLT 263 04/15/2020    Inpatient Medications  Scheduled Meds: . apixaban  5 mg Oral BID  . cloNIDine  0.1 mg Oral Daily  . folic acid  1 mg Oral Daily  . insulin aspart  0-20 Units Subcutaneous TID WC  . insulin aspart  0-5 Units Subcutaneous QHS  . [START ON 04/16/2020] insulin aspart  3 Units Subcutaneous TID WC  . insulin glargine  20 Units Subcutaneous QHS  . LORazepam  1 mg Intravenous Once  . metoprolol tartrate  25 mg Oral BID  . mometasone-formoterol  2 puff Inhalation BID  . pravastatin  80 mg Oral QHS  . sodium chloride flush  3  mL Intravenous Q12H  . thiamine  100 mg Oral Daily   Continuous Infusions: . sodium chloride    . sodium chloride 150 mL/hr at 04/14/20 1748   PRN Meds:.sodium chloride, acetaminophen **OR** acetaminophen, albuterol, dextrose, haloperidol lactate, LORazepam, ondansetron **OR** ondansetron (ZOFRAN) IV, polyethylene glycol, sodium chloride flush    Anti-infectives (From admission, onward)   None        Objective:   Vitals:   04/15/20 0138 04/15/20 0419 04/15/20 0839 04/15/20 1618  BP: (!) 165/76 (!) 173/80  (!) 142/92  Pulse: 60 (!) 105  (!) 105  Resp:  20  20  Temp: 99.3 F (37.4 C) 98 F (36.7 C)  98 F (36.7 C)  TempSrc:  Oral  Oral  SpO2: 91% 95% 93% 94%  Weight:        Wt Readings from Last 3 Encounters:  04/14/20 77.1 kg  03/24/20 77.6 kg  03/18/20 78 kg     Intake/Output Summary (Last 24 hours) at 04/15/2020 1910 Last data filed at 04/15/2020 0400 Gross per 24 hour  Intake 1335.49 ml  Output --  Net 1335.49 ml     Physical Exam  Gen:- Awake, confused and disoriented  mental status -remains encephalopathic Nose- [place] 2L/min Eyes - sclera anicteric Neck - supple, no JVD elevation , Chest - clear  to auscultation bilaterally, symmetrical air movement,  Heart - S1 and S2 normal, regular  Abdomen - soft, nontender, nondistended,  NeuroPsych--neuro exam is limited due to metabolic encephalopathy/altered mentation Extremities - no pedal edema noted, intact peripheral pulses  Skin - warm, dry   Data Review:   Micro Results Recent Results (from the past 240 hour(s))  SARS Coronavirus 2 by RT PCR (hospital order, performed in [place] hospital lab) Nasopharyngeal Nasopharyngeal Swab     Status: None   Collection Time: 04/14/20 10:31 AM   Specimen: Nasopharyngeal Swab  Result Value Ref Range Status   SARS Coronavirus 2 NEGATIVE NEGATIVE Final    Comment: (NOTE) SARS-CoV-2 target nucleic acids are NOT DETECTED.  The SARS-CoV-2 RNA is generally  detectable in upper and lower respiratory specimens during the acute phase of infection. The lowest concentration of SARS-CoV-2 viral copies this assay can detect is 250 copies / mL. A negative result does not preclude SARS-CoV-2 infection and should not be used as the sole basis for treatment or other patient management decisions.  A negative result may occur with improper specimen collection / handling, submission of specimen other than nasopharyngeal swab, presence of viral mutation(s) within the areas targeted by this assay, and inadequate number of viral copies (<250 copies / mL). A negative result must be combined with clinical observations, patient history, and epidemiological information.  Fact Sheet for Patients:   StrictlyIdeas.no  Fact Sheet for Healthcare Providers: BankingDealers.co.za  This test is not yet approved or  cleared by the [place] FDA and has been authorized for detection and/or diagnosis of SARS-CoV-2 by FDA under an Emergency Use Authorization (EUA).  This EUA  will remain in effect (meaning this test can be used) for the duration of the COVID-19 declaration under Section 564(b)(1) of the Act, 21 U.S.C. section 360bbb-3(b)(1), unless the authorization is terminated or revoked sooner.  Performed at [facility], [address] [place], [place] [address]   Culture, blood (Routine X 2) w Reflex to ID Panel     Status: None (Preliminary result)   Collection Time: 04/15/20  8:34 AM   Specimen: BLOOD RIGHT HAND  Result Value Ref Range Status   Specimen Description BLOOD RIGHT HAND  Final   Special Requests   Final    BOTTLES DRAWN AEROBIC AND ANAEROBIC Blood Culture adequate volume Performed at [facility], [address], [place], [place] [address]    Culture PENDING  Incomplete   Report Status PENDING  Incomplete  Culture, blood (Routine X 2) w Reflex to ID Panel     Status: None (Preliminary result)    Collection Time: 04/15/20  8:34 AM   Specimen: Left Antecubital; Blood  Result Value Ref Range Status   Specimen Description LEFT ANTECUBITAL BLOOD  Final   Special Requests   Final    BOTTLES DRAWN AEROBIC AND ANAEROBIC Blood Culture adequate volume Performed at [facility], [address], [place], [place] [address]    Culture PENDING  Incomplete   Report Status PENDING  Incomplete    Radiology Reports CT Head Wo Contrast  Result Date: 03/23/2020 CLINICAL DATA:  85 year old male with neurologic deficit. EXAM: CT HEAD WITHOUT CONTRAST TECHNIQUE: Contiguous axial images were obtained from the base of the skull through the vertex without intravenous contrast. COMPARISON:  Head CT dated 01/19/2020. FINDINGS: Brain: Mild age-related atrophy and chronic microvascular ischemic changes. There is no acute intracranial hemorrhage. No mass effect or midline shift. No extra-axial fluid collection. Vascular: No hyperdense vessel or unexpected calcification. Skull: Normal. Negative for fracture or focal lesion. Sinuses/Orbits: Mild mucoperiosteal thickening of paranasal sinuses. No air-fluid level. The mastoid air cells are clear. Other: None IMPRESSION: 1. No acute intracranial pathology. 2. Mild age-related atrophy and chronic microvascular ischemic changes. Electronically Signed   By: Anner Crete M.D.   On: 03/23/2020 19:31   DG Chest Port 1 View  Result Date: 04/14/2020 CLINICAL DATA:  Shortness of breath EXAM: PORTABLE CHEST 1 VIEW COMPARISON:  03/23/2020 FINDINGS: Mild interstitial prominence throughout the lungs, likely chronic. Heart and mediastinal contours are within normal limits. No focal opacities or effusions. No acute bony abnormality. IMPRESSION: No active disease. Electronically Signed   By: Rolm Baptise M.D.   On: 04/14/2020 09:57   DG Chest Port 1 View  Result Date: 03/23/2020 CLINICAL DATA:  85 year old male with wheezing. EXAM: PORTABLE CHEST 1 VIEW COMPARISON:  Chest radiograph  dated 01/19/2020. FINDINGS: Mild diffuse interstitial prominence and nodularity, chronic. No new consolidative changes. There is no pleural effusion pneumothorax. Stable cardiac silhouette. Atherosclerotic calcification of the aorta. No acute osseous pathology. IMPRESSION: No interval change.  No new consolidation. Electronically Signed   By: Anner Crete M.D.   On: 03/23/2020 18:49     CBC Recent Labs  Lab 04/14/20 0935 04/14/20 1146 04/15/20 0425  WBC 12.7* 14.6* 15.7*  HGB 12.7* 11.9* 11.2*  HCT 40.1 38.0* 35.1*  PLT 253 247 263  MCV 93.0 94.1 93.9  MCH 29.5 29.5 29.9  MCHC 31.7 31.3 31.9  RDW 13.9 13.9 14.0  LYMPHSABS 1.3 1.3  --   MONOABS 0.9 1.1*  --   EOSABS 0.0 0.0  --   BASOSABS  0.0 0.0  --     Chemistries  Recent Labs  Lab 04/14/20 0935 04/14/20 1146 04/14/20 1524 04/14/20 1948 04/15/20 0010 04/15/20 0425 04/15/20 0834  NA 144   < > 144 150* 150* 149* 150*  K 4.2   < > 4.2 3.0* 3.3* 3.3* 3.4*  CL 97*   < > 106 109 109 109 109  CO2 29   < > 24 31 29 28 27   GLUCOSE 511*   < > 439* 102* 95 175* 286*  BUN 47*   < > 44* 41* 41* 40* 42*  CREATININE 1.49*   < > 1.47* 1.26* 1.22 1.28* 1.30*  CALCIUM 9.6   < > 8.6* 9.0 9.0 9.0 9.2  MG 2.9*  --   --   --   --   --   --   AST 11*  --   --   --   --   --   --   ALT 14  --   --   --   --   --   --   ALKPHOS 90  --   --   --   --   --   --   BILITOT 0.8  --   --   --   --   --   --    < > = values in this interval not displayed.   ------------------------------------------------------------------------------------------------------------------ No results for input(s): CHOL, HDL, LDLCALC, TRIG, CHOLHDL, LDLDIRECT in the last 72 hours.  Lab Results  Component Value Date   HGBA1C 8.9 (H) 04/14/2020   ------------------------------------------------------------------------------------------------------------------ No results for input(s): TSH, T4TOTAL, T3FREE, THYROIDAB in the last 72 hours.  Invalid input(s):  FREET3 ------------------------------------------------------------------------------------------------------------------ No results for input(s): VITAMINB12, FOLATE, FERRITIN, TIBC, IRON, RETICCTPCT in the last 72 hours.  Coagulation profile No results for input(s): INR, PROTIME in the last 168 hours.  No results for input(s): DDIMER in the last 72 hours.  Cardiac Enzymes No results for input(s): CKMB, TROPONINI, MYOGLOBIN in the last 168 hours.  Invalid input(s): CK ------------------------------------------------------------------------------------------------------------------    Component Value Date/Time   BNP 298.0 (H) 03/23/2020 1705   BNP 101 (H) 08/19/2018 1015     Roxan Hockey M.D on 04/15/2020 at 7:10 PM  Go to [email or web address] - for contact info  [facility] - Office  [phone]

## 2020-04-15 NOTE — Plan of Care (Signed)
  Problem: Education: Goal: Knowledge of General Education information will improve Description Including pain rating scale, medication(s)/side effects and non-pharmacologic comfort measures Outcome: Progressing   Problem: Health Behavior/Discharge Planning: Goal: Ability to manage health-related needs will improve Outcome: Progressing   

## 2020-04-15 NOTE — Progress Notes (Signed)
   04/15/20 2116  Vitals  Temp 100 F (37.8 C)  Temp Source Axillary  BP (!) 164/91  BP Method Automatic  Patient Position (if appropriate) Lying  Pulse Rate (!) 120  Pulse Rate Source Dinamap  Resp 20  Oxygen Therapy  SpO2 98 %  O2 Device Nasal Cannula  O2 Flow Rate (L/min) 2 L/min  MEWS Score  MEWS Temp 0  MEWS Systolic 0  MEWS Pulse 2  MEWS RR 0  MEWS LOC 0  MEWS Score 2  MEWS Score Color Yellow   Patient disoriented x4. Responds to Voice. Tremors Noted.  MD notified. Patient placed on telemetry.

## 2020-04-16 DIAGNOSIS — E870 Hyperosmolality and hypernatremia: Secondary | ICD-10-CM | POA: Diagnosis present

## 2020-04-16 DIAGNOSIS — Z9981 Dependence on supplemental oxygen: Secondary | ICD-10-CM | POA: Diagnosis not present

## 2020-04-16 DIAGNOSIS — G9341 Metabolic encephalopathy: Secondary | ICD-10-CM | POA: Diagnosis not present

## 2020-04-16 DIAGNOSIS — J9611 Chronic respiratory failure with hypoxia: Secondary | ICD-10-CM | POA: Diagnosis present

## 2020-04-16 DIAGNOSIS — T383X6A Underdosing of insulin and oral hypoglycemic [antidiabetic] drugs, initial encounter: Secondary | ICD-10-CM | POA: Diagnosis present

## 2020-04-16 DIAGNOSIS — B9562 Methicillin resistant Staphylococcus aureus infection as the cause of diseases classified elsewhere: Secondary | ICD-10-CM | POA: Diagnosis present

## 2020-04-16 DIAGNOSIS — Z20822 Contact with and (suspected) exposure to covid-19: Secondary | ICD-10-CM | POA: Diagnosis present

## 2020-04-16 DIAGNOSIS — E11649 Type 2 diabetes mellitus with hypoglycemia without coma: Secondary | ICD-10-CM | POA: Diagnosis not present

## 2020-04-16 DIAGNOSIS — E860 Dehydration: Secondary | ICD-10-CM | POA: Diagnosis present

## 2020-04-16 DIAGNOSIS — E7800 Pure hypercholesterolemia, unspecified: Secondary | ICD-10-CM | POA: Diagnosis present

## 2020-04-16 DIAGNOSIS — I110 Hypertensive heart disease with heart failure: Secondary | ICD-10-CM | POA: Diagnosis present

## 2020-04-16 DIAGNOSIS — Z91138 Patient's unintentional underdosing of medication regimen for other reason: Secondary | ICD-10-CM | POA: Diagnosis not present

## 2020-04-16 DIAGNOSIS — E1165 Type 2 diabetes mellitus with hyperglycemia: Secondary | ICD-10-CM | POA: Diagnosis present

## 2020-04-16 DIAGNOSIS — E1159 Type 2 diabetes mellitus with other circulatory complications: Secondary | ICD-10-CM | POA: Diagnosis present

## 2020-04-16 DIAGNOSIS — E876 Hypokalemia: Secondary | ICD-10-CM | POA: Diagnosis present

## 2020-04-16 DIAGNOSIS — J449 Chronic obstructive pulmonary disease, unspecified: Secondary | ICD-10-CM | POA: Diagnosis present

## 2020-04-16 DIAGNOSIS — I5032 Chronic diastolic (congestive) heart failure: Secondary | ICD-10-CM | POA: Diagnosis present

## 2020-04-16 DIAGNOSIS — J8410 Pulmonary fibrosis, unspecified: Secondary | ICD-10-CM | POA: Diagnosis present

## 2020-04-16 DIAGNOSIS — I330 Acute and subacute infective endocarditis: Secondary | ICD-10-CM | POA: Diagnosis present

## 2020-04-16 DIAGNOSIS — D509 Iron deficiency anemia, unspecified: Secondary | ICD-10-CM | POA: Diagnosis present

## 2020-04-16 DIAGNOSIS — R7881 Bacteremia: Secondary | ICD-10-CM | POA: Diagnosis not present

## 2020-04-16 DIAGNOSIS — N179 Acute kidney failure, unspecified: Secondary | ICD-10-CM | POA: Diagnosis present

## 2020-04-16 DIAGNOSIS — D649 Anemia, unspecified: Secondary | ICD-10-CM | POA: Diagnosis present

## 2020-04-16 DIAGNOSIS — I480 Paroxysmal atrial fibrillation: Secondary | ICD-10-CM | POA: Diagnosis present

## 2020-04-16 DIAGNOSIS — E785 Hyperlipidemia, unspecified: Secondary | ICD-10-CM | POA: Diagnosis present

## 2020-04-16 DIAGNOSIS — A4102 Sepsis due to Methicillin resistant Staphylococcus aureus: Secondary | ICD-10-CM | POA: Diagnosis present

## 2020-04-16 LAB — CBC
HCT: 39.1 % (ref 39.0–52.0)
Hemoglobin: 11.7 g/dL — ABNORMAL LOW (ref 13.0–17.0)
MCH: 29.4 pg (ref 26.0–34.0)
MCHC: 29.9 g/dL — ABNORMAL LOW (ref 30.0–36.0)
MCV: 98.2 fL (ref 80.0–100.0)
Platelets: 283 10*3/uL (ref 150–400)
RBC: 3.98 MIL/uL — ABNORMAL LOW (ref 4.22–5.81)
RDW: 14.1 % (ref 11.5–15.5)
WBC: 14.6 10*3/uL — ABNORMAL HIGH (ref 4.0–10.5)
nRBC: 0 % (ref 0.0–0.2)

## 2020-04-16 LAB — GLUCOSE, CAPILLARY
Glucose-Capillary: 126 mg/dL — ABNORMAL HIGH (ref 70–99)
Glucose-Capillary: 66 mg/dL — ABNORMAL LOW (ref 70–99)
Glucose-Capillary: 104 mg/dL — ABNORMAL HIGH (ref 70–99)
Glucose-Capillary: 193 mg/dL — ABNORMAL HIGH (ref 70–99)
Glucose-Capillary: 207 mg/dL — ABNORMAL HIGH (ref 70–99)
Glucose-Capillary: 168 mg/dL — ABNORMAL HIGH (ref 70–99)
Glucose-Capillary: 169 mg/dL — ABNORMAL HIGH (ref 70–99)

## 2020-04-16 LAB — COMPREHENSIVE METABOLIC PANEL
ALT: 13 U/L (ref 0–44)
AST: 20 U/L (ref 15–41)
Albumin: 3.3 g/dL — ABNORMAL LOW (ref 3.5–5.0)
Alkaline Phosphatase: 76 U/L (ref 38–126)
Anion gap: 19 — ABNORMAL HIGH (ref 5–15)
BUN: 42 mg/dL — ABNORMAL HIGH (ref 8–23)
CO2: 27 mmol/L (ref 22–32)
Calcium: 9.8 mg/dL (ref 8.9–10.3)
Chloride: 110 mmol/L (ref 98–111)
Creatinine, Ser: 1.47 mg/dL — ABNORMAL HIGH (ref 0.61–1.24)
GFR calc Af Amer: 51 mL/min — ABNORMAL LOW (ref >60)
GFR calc non Af Amer: 44 mL/min — ABNORMAL LOW (ref >60)
Glucose, Bld: 119 mg/dL — ABNORMAL HIGH (ref 70–99)
Potassium: 3.2 mmol/L — ABNORMAL LOW (ref 3.5–5.1)
Sodium: 156 mmol/L — ABNORMAL HIGH (ref 135–145)
Total Bilirubin: 0.5 mg/dL (ref 0.3–1.2)
Total Protein: 7.3 g/dL (ref 6.5–8.1)

## 2020-04-16 LAB — URINALYSIS, ROUTINE W REFLEX MICROSCOPIC
Bacteria, UA: NONE SEEN
Bilirubin Urine: NEGATIVE
Glucose, UA: >=500 mg/dL — AB
Ketones, ur: NEGATIVE mg/dL
Leukocytes,Ua: NEGATIVE
Nitrite: NEGATIVE
Protein, ur: >=300 mg/dL — AB
Specific Gravity, Urine: 1.023 (ref 1.005–1.030)
pH: 5 (ref 5.0–8.0)

## 2020-04-16 LAB — BLOOD CULTURE ID PANEL (REFLEXED)

## 2020-04-16 MED ORDER — VANCOMYCIN HCL 750 MG/150ML IV SOLN
750.0000 mg | INTRAVENOUS | Status: DC
  Administered 2020-04-17 – 2020-04-20 (×4): 750 mg via INTRAVENOUS
  Filled 2020-04-16 (×4): qty 150

## 2020-04-16 MED ORDER — METOPROLOL TARTRATE 50 MG PO TABS
50.0000 mg | ORAL_TABLET | Freq: Once | ORAL | Status: AC
  Administered 2020-04-16: 50 mg via ORAL
  Filled 2020-04-16: qty 1

## 2020-04-16 MED ORDER — SODIUM CHLORIDE 0.9 % IV SOLN
2.0000 g | INTRAVENOUS | Status: DC
  Administered 2020-04-16: 2 g via INTRAVENOUS
  Filled 2020-04-16: qty 20

## 2020-04-16 MED ORDER — METOPROLOL TARTRATE 50 MG PO TABS
50.0000 mg | ORAL_TABLET | Freq: Two times a day (BID) | ORAL | Status: DC
  Administered 2020-04-17 – 2020-04-20 (×7): 50 mg via ORAL
  Filled 2020-04-16 (×7): qty 1

## 2020-04-16 MED ORDER — DEXTROSE 50 % IV SOLN
12.5000 g | INTRAVENOUS | Status: AC
  Administered 2020-04-16: 12.5 g via INTRAVENOUS

## 2020-04-16 MED ORDER — VANCOMYCIN HCL 1750 MG/350ML IV SOLN
1750.0000 mg | Freq: Once | INTRAVENOUS | Status: AC
  Administered 2020-04-16: 1750 mg via INTRAVENOUS
  Filled 2020-04-16: qty 350

## 2020-04-16 NOTE — Progress Notes (Signed)
Patient Demographics:    Edward [name], is a 85 y.o. male, DOB - 1934/11/22, NOB:[id number]  Admit date - 04/14/2020   Admitting Physician Santiana Glidden Denton Brick, MD  Outpatient Primary MD for the patient is [name], [name], MD  LOS - 0  Chief Complaint  Patient presents with  . Hyperglycemia        Subjective:    Edward [name] today has no fevers, no emesis,  No chest pain,    -Remains confused and disoriented, son and wife at bedside, -As per patient's family cognitively patient is not back to baseline -Refused medications due to confusion including metoprolol so heart rate has been running high  Assessment  & Plan :    Principal Problem:   MRSA bacteremia Active Problems:   Acute metabolic encephalopathy   COPD (chronic obstructive pulmonary disease) ([name])   Hypertension   Pulmonary fibrosis ([name])   Hypercholesterolemia   DM type 2 causing vascular disease ([name])   Chronic diastolic CHF (congestive heart failure) ([name])   AF (paroxysmal atrial fibrillation) ([name])   Normocytic anemia   Acute kidney injury ([place])   1)MRSA bacteremia--start IV vancomycin on 04/16/2020 -Echo pending Microbiology results: 7/16 BCx2:NG x24h 7/16 UCx:  7/15 BC x2: BCID= MRSA in 1 of 2 bottles   2) acute metabolic encephalopathy in the setting of hyperglycemia and MRSA bacteremia--continue to hydrate, continue to monitor sugar levels -UA not suggestive of UTI Leukocytosis white count down to 14.6 from 15.7   --Hold Lyrica -Use Haldol and lorazepam as needed agitation -As per family members cognitively patient is way far from baseline  2) chronic hypoxic respiratory failure--continue supplemental oxygen- -- ABG w/o hypercapnia   -Clinically and radiologically no pneumonia   3)AKI--due to dehydration and hyperglycemia, baseline creatinine is usually between 0.9 and 1, creatinine is 1.56 -Creatinine is down  to 1.47  , renally adjust medications, avoid nephrotoxic agents / dehydration  / hypotension -Hold Lasix, -Hold Metformin  4)PAFIb--stable, continue metoprolol for rate control, continue Eliquis for stroke prophylaxis -Tachycardia noted as patient refused metoprolol earlier  5)COPD-no acute exacerbation at this time, continue bronchodilators,   6) uncontrolled DM--A1c 8.9, decrease Lantus insulin to 20 units from 30 units due to episodes of hypoglycemia  insulin and IV fluids as ordered -Hold Metformin due to dehydration and AKI   7)HyperNatremia--may be due to IV fluids with normal saline--free water deficit -Change IV fluids to half-normal saline =-Patient not drinking enough  Disposition/Need for in-Hospital Stay- patient unable to be discharged at this time due to --- -MRSA bacteremia requiring IV antibiotics, hypernatremia requiring IV fluids, acute metabolic encephalopathy which is still quite far from baseline from him cognitive standpoint requiring further improvement prior to discharge  Dispo: The patient is from: Home  Anticipated d/c is to: Home with HH Vs SNF  Anticipated d/c date is: 1 day  Patient currently is not medically stable to d/c. Barriers: Not Clinically Stable- - -MRSA bacteremia requiring IV antibiotics, hypernatremia requiring IV fluids, acute metabolic encephalopathy which is still quite far from baseline from him cognitive standpoint requiring further improvement prior to discharge   Status is: Inpatient  Remains inpatient appropriate because:Metabolic encephalopathy requiring further investigation and IV fluids MRSA bacteremia requiring IV antibiotics, hypernatremia requiring IV fluids,  acute metabolic encephalopathy which is still quite far from baseline from him cognitive standpoint requiring further improvement prior to discharge   Code Status : Full  Family Communication:    Discussed with patient's wife and  son at bedside Consults  :  na DVT Prophylaxis  : Eliquis Lab Results  Component Value Date   PLT 283 04/16/2020    Inpatient Medications  Scheduled Meds: . apixaban  5 mg Oral BID  . cloNIDine  0.1 mg Oral Daily  . folic acid  1 mg Oral Daily  . insulin aspart  0-20 Units Subcutaneous TID WC  . insulin aspart  0-5 Units Subcutaneous QHS  . insulin aspart  3 Units Subcutaneous TID WC  . insulin glargine  20 Units Subcutaneous QHS  . [START ON 04/17/2020] metoprolol tartrate  50 mg Oral BID  . mometasone-formoterol  2 puff Inhalation BID  . pravastatin  80 mg Oral QHS  . sodium chloride flush  3 mL Intravenous Q12H  . thiamine  100 mg Oral Daily   Continuous Infusions: . sodium chloride 50 mL/hr at 04/15/20 1930  . sodium chloride    . [START ON 04/17/2020] vancomycin     PRN Meds:.sodium chloride, acetaminophen **OR** acetaminophen, albuterol, dextrose, haloperidol lactate, LORazepam, metoprolol tartrate, ondansetron **OR** ondansetron (ZOFRAN) IV, polyethylene glycol, sodium chloride flush    Anti-infectives (From admission, onward)   Start     Dose/Rate Route Frequency Ordered Stop   04/17/20 1500  vancomycin (VANCOREADY) IVPB 750 mg/150 mL     Discontinue     750 mg 150 mL/hr over 60 Minutes Intravenous Every 24 hours 04/16/20 1537     04/16/20 1500  vancomycin (VANCOREADY) IVPB 1750 mg/350 mL        1,750 mg 175 mL/hr over 120 Minutes Intravenous  Once 04/16/20 1413 04/16/20 1700   04/16/20 0830  cefTRIAXone (ROCEPHIN) 2 g in sodium chloride 0.9 % 100 mL IVPB  Status:  Discontinued        2 g 200 mL/hr over 30 Minutes Intravenous Every 24 hours 04/16/20 0805 04/16/20 1447        Objective:   Vitals:   04/16/20 1345 04/16/20 1500 04/16/20 1842 04/16/20 1939  BP: 132/71 119/64 (!) 145/87   Pulse: (!) 128 (!) 118 (!) 117   Resp: 18 19 19    Temp: 100.1 F (37.8 C) 99.7 F (37.6 C) 98.8 F (37.1 C)   TempSrc: Oral Oral Oral   SpO2: 100% 99% 97% 90%  Weight:         Wt Readings from Last 3 Encounters:  04/14/20 77.1 kg  03/24/20 77.6 kg  03/18/20 78 kg     Intake/Output Summary (Last 24 hours) at 04/16/2020 1945 Last data filed at 04/16/2020 0900 Gross per 24 hour  Intake 604.14 ml  Output 200 ml  Net 404.14 ml     Physical Exam  Gen:- Awake, confused and disoriented  mental status -remains encephalopathic Nose- [place] 2L/min Eyes - sclera anicteric Neck - supple, no JVD elevation , Chest - clear  to auscultation bilaterally, symmetrical air movement,  Heart - S1 and S2 normal, regular  Abdomen - soft, nontender, nondistended,  NeuroPsych--neuro exam is limited due to metabolic encephalopathy/altered mentation Extremities - no pedal edema noted, intact peripheral pulses  Skin - warm, dry   Data Review:   Micro Results Recent Results (from the past 240 hour(s))  SARS Coronavirus 2 by RT PCR (hospital order, performed in [facility] hospital lab) Nasopharyngeal  Nasopharyngeal Swab     Status: None   Collection Time: 04/14/20 10:31 AM   Specimen: Nasopharyngeal Swab  Result Value Ref Range Status   SARS Coronavirus 2 NEGATIVE NEGATIVE Final    Comment: (NOTE) SARS-CoV-2 target nucleic acids are NOT DETECTED.  The SARS-CoV-2 RNA is generally detectable in upper and lower respiratory specimens during the acute phase of infection. The lowest concentration of SARS-CoV-2 viral copies this assay can detect is 250 copies / mL. A negative result does not preclude SARS-CoV-2 infection and should not be used as the sole basis for treatment or other patient management decisions.  A negative result may occur with improper specimen collection / handling, submission of specimen other than nasopharyngeal swab, presence of viral mutation(s) within the areas targeted by this assay, and inadequate number of viral copies (<250 copies / mL). A negative result must be combined with clinical observations, patient history, and epidemiological  information.  Fact Sheet for Patients:   StrictlyIdeas.no  Fact Sheet for Healthcare Providers: BankingDealers.co.za  This test is not yet approved or  cleared by the [place] FDA and has been authorized for detection and/or diagnosis of SARS-CoV-2 by FDA under an Emergency Use Authorization (EUA).  This EUA will remain in effect (meaning this test can be used) for the duration of the COVID-19 declaration under Section 564(b)(1) of the Act, 21 U.S.C. section 360bbb-3(b)(1), unless the authorization is terminated or revoked sooner.  Performed at [facility], [address], [place], [place] [address]   Culture, blood (Routine X 2) w Reflex to ID Panel     Status: None (Preliminary result)   Collection Time: 04/15/20  8:34 AM   Specimen: BLOOD RIGHT HAND  Result Value Ref Range Status   Specimen Description   Final    BLOOD RIGHT HAND Performed at [facility], [address], [place], [place] [address]    Special Requests   Final    BOTTLES DRAWN AEROBIC AND ANAEROBIC Blood Culture adequate volume Performed at [facility], [address], [place], [place] [address]    Culture  Setup Time   Final    GRAM POSITIVE COCCI IN CLUSTERS CRITICAL RESULT CALLED TO, READ BACK BY AND VERIFIED WITH: Rhew,J at [place] on 04/16/20 by [place],S. AEROBIC BOTTLE ONLY Organism ID to follow CRITICAL RESULT CALLED TO, READ BACK BY AND VERIFIED WITH: Cloyde Reams [id number] MLM Performed at [facility], [address], [place], [place] [address]    Culture GRAM POSITIVE COCCI  Final   Report Status PENDING  Incomplete  Culture, blood (Routine X 2) w Reflex to ID Panel     Status: None (Preliminary result)   Collection Time: 04/15/20  8:34 AM   Specimen: Left Antecubital; Blood  Result Value Ref Range Status   Specimen Description LEFT ANTECUBITAL BLOOD  Final   Special Requests   Final    BOTTLES DRAWN AEROBIC AND ANAEROBIC Blood  Culture adequate volume   Culture   Final    NO GROWTH < 24 HOURS Performed at [facility], [address] [place], [place] [address]    Report Status PENDING  Incomplete  Blood Culture ID Panel (Reflexed)     Status: Abnormal   Collection Time: 04/15/20  8:34 AM  Result Value Ref Range Status   Enterococcus species NOT DETECTED NOT DETECTED Final   Listeria monocytogenes NOT DETECTED NOT DETECTED Final   Staphylococcus species DETECTED (A) NOT DETECTED Final    Comment: CRITICAL RESULT  CALLED TO, READ BACK BY AND VERIFIED WITH: PHARMD S HURTH [id number] MLM    Staphylococcus aureus (BCID) DETECTED (A) NOT DETECTED Final    Comment: Methicillin (oxacillin)-resistant Staphylococcus aureus (MRSA). MRSA is predictably resistant to beta-lactam antibiotics (except ceftaroline). Preferred therapy is vancomycin unless clinically contraindicated. Patient requires contact precautions if  hospitalized. CRITICAL RESULT CALLED TO, READ BACK BY AND VERIFIED WITH: PHARMD S HURTH [id number] MLM    Methicillin resistance DETECTED (A) NOT DETECTED Final    Comment: CRITICAL RESULT CALLED TO, READ BACK BY AND VERIFIED WITH: PHARMD S HURTH [id number] MLM    Streptococcus species NOT DETECTED NOT DETECTED Final   Streptococcus agalactiae NOT DETECTED NOT DETECTED Final   Streptococcus pneumoniae NOT DETECTED NOT DETECTED Final   Streptococcus pyogenes NOT DETECTED NOT DETECTED Final   Acinetobacter baumannii NOT DETECTED NOT DETECTED Final   Enterobacteriaceae species NOT DETECTED NOT DETECTED Final   Enterobacter cloacae complex NOT DETECTED NOT DETECTED Final   Escherichia coli NOT DETECTED NOT DETECTED Final   Klebsiella oxytoca NOT DETECTED NOT DETECTED Final   Klebsiella pneumoniae NOT DETECTED NOT DETECTED Final   Proteus species NOT DETECTED NOT DETECTED Final   Serratia marcescens NOT DETECTED NOT DETECTED Final   Haemophilus influenzae NOT DETECTED NOT DETECTED Final   Neisseria  meningitidis NOT DETECTED NOT DETECTED Final   Pseudomonas aeruginosa NOT DETECTED NOT DETECTED Final   Candida albicans NOT DETECTED NOT DETECTED Final   Candida glabrata NOT DETECTED NOT DETECTED Final   Candida krusei NOT DETECTED NOT DETECTED Final   Candida parapsilosis NOT DETECTED NOT DETECTED Final   Candida tropicalis NOT DETECTED NOT DETECTED Final    Comment: Performed at [facility], [place]. [address] [place], [place] [address]  Culture, blood (Routine X 2) w Reflex to ID Panel     Status: None (Preliminary result)   Collection Time: 04/16/20  8:48 AM   Specimen: BLOOD RIGHT ARM  Result Value Ref Range Status   Specimen Description BLOOD RIGHT ARM  Final   Special Requests   Final    BOTTLES DRAWN AEROBIC AND ANAEROBIC Blood Culture adequate volume Performed at [facility], [address], [place], [place] [address]    Culture PENDING  Incomplete   Report Status PENDING  Incomplete  Culture, blood (Routine X 2) w Reflex to ID Panel     Status: None (Preliminary result)   Collection Time: 04/16/20  8:56 AM   Specimen: BLOOD RIGHT HAND  Result Value Ref Range Status   Specimen Description BLOOD RIGHT HAND  Final   Special Requests   Final    BOTTLES DRAWN AEROBIC AND ANAEROBIC Blood Culture adequate volume Performed at [facility], [address], [place], [place] [address]    Culture PENDING  Incomplete   Report Status PENDING  Incomplete    Radiology Reports CT Head Wo Contrast  Result Date: 03/23/2020 CLINICAL DATA:  85 year old male with neurologic deficit. EXAM: CT HEAD WITHOUT CONTRAST TECHNIQUE: Contiguous axial images were obtained from the base of the skull through the vertex without intravenous contrast. COMPARISON:  Head CT dated 01/19/2020. FINDINGS: Brain: Mild age-related atrophy and chronic microvascular ischemic changes. There is no acute intracranial hemorrhage. No mass effect or midline shift. No extra-axial fluid collection. Vascular: No hyperdense  vessel or unexpected calcification. Skull: Normal. Negative for fracture or focal lesion. Sinuses/Orbits: Mild mucoperiosteal thickening of paranasal sinuses. No air-fluid level. The mastoid air cells are clear. Other: None IMPRESSION: 1. No  acute intracranial pathology. 2. Mild age-related atrophy and chronic microvascular ischemic changes. Electronically Signed   By: Anner Crete M.D.   On: 03/23/2020 19:31   DG Chest Port 1 View  Result Date: 04/14/2020 CLINICAL DATA:  Shortness of breath EXAM: PORTABLE CHEST 1 VIEW COMPARISON:  03/23/2020 FINDINGS: Mild interstitial prominence throughout the lungs, likely chronic. Heart and mediastinal contours are within normal limits. No focal opacities or effusions. No acute bony abnormality. IMPRESSION: No active disease. Electronically Signed   By: Rolm Baptise M.D.   On: 04/14/2020 09:57   DG Chest Port 1 View  Result Date: 03/23/2020 CLINICAL DATA:  85 year old male with wheezing. EXAM: PORTABLE CHEST 1 VIEW COMPARISON:  Chest radiograph dated 01/19/2020. FINDINGS: Mild diffuse interstitial prominence and nodularity, chronic. No new consolidative changes. There is no pleural effusion pneumothorax. Stable cardiac silhouette. Atherosclerotic calcification of the aorta. No acute osseous pathology. IMPRESSION: No interval change.  No new consolidation. Electronically Signed   By: Anner Crete M.D.   On: 03/23/2020 18:49     CBC Recent Labs  Lab 04/14/20 0935 04/14/20 1146 04/15/20 0425 04/16/20 0703  WBC 12.7* 14.6* 15.7* 14.6*  HGB 12.7* 11.9* 11.2* 11.7*  HCT 40.1 38.0* 35.1* 39.1  PLT 253 247 263 283  MCV 93.0 94.1 93.9 98.2  MCH 29.5 29.5 29.9 29.4  MCHC 31.7 31.3 31.9 29.9*  RDW 13.9 13.9 14.0 14.1  LYMPHSABS 1.3 1.3  --   --   MONOABS 0.9 1.1*  --   --   EOSABS 0.0 0.0  --   --   BASOSABS 0.0 0.0  --   --     Chemistries  Recent Labs  Lab 04/14/20 0935 04/14/20 1146 04/14/20 1948 04/15/20 0010 04/15/20 0425 04/15/20 0834  04/16/20 0703  NA 144   < > 150* 150* 149* 150* 156*  K 4.2   < > 3.0* 3.3* 3.3* 3.4* 3.2*  CL 97*   < > 109 109 109 109 110  CO2 29   < > 31 29 28 27 27   GLUCOSE 511*   < > 102* 95 175* 286* 119*  BUN 47*   < > 41* 41* 40* 42* 42*  CREATININE 1.49*   < > 1.26* 1.22 1.28* 1.30* 1.47*  CALCIUM 9.6   < > 9.0 9.0 9.0 9.2 9.8  MG 2.9*  --   --   --   --   --   --   AST 11*  --   --   --   --   --  20  ALT 14  --   --   --   --   --  13  ALKPHOS 90  --   --   --   --   --  76  BILITOT 0.8  --   --   --   --   --  0.5   < > = values in this interval not displayed.   ------------------------------------------------------------------------------------------------------------------ No results for input(s): CHOL, HDL, LDLCALC, TRIG, CHOLHDL, LDLDIRECT in the last 72 hours.  Lab Results  Component Value Date   HGBA1C 8.9 (H) 04/14/2020   ------------------------------------------------------------------------------------------------------------------ No results for input(s): TSH, T4TOTAL, T3FREE, THYROIDAB in the last 72 hours.  Invalid input(s): FREET3 ------------------------------------------------------------------------------------------------------------------ No results for input(s): VITAMINB12, FOLATE, FERRITIN, TIBC, IRON, RETICCTPCT in the last 72 hours.  Coagulation profile No results for input(s): INR, PROTIME in the last 168 hours.  No results for input(s): DDIMER in the last 72  hours.  Cardiac Enzymes No results for input(s): CKMB, TROPONINI, MYOGLOBIN in the last 168 hours.  Invalid input(s): CK ------------------------------------------------------------------------------------------------------------------    Component Value Date/Time   BNP 298.0 (H) 03/23/2020 1705   BNP 101 (H) 08/19/2018 1015     Roxan Hockey M.D on 04/16/2020 at 7:45 PM  Go to [email or web address] - for contact info  [facility] - Office  [phone]

## 2020-04-16 NOTE — Progress Notes (Signed)
Hypoglycemic Event  CBG: 66 Treatment: D50 25 mL (12.5 gm)  Symptoms: None  Follow-up CBG: Time: 0506 CBG Result: 126  Possible Reasons for Event: Unknown  Comments/MD notified: Pt refusing to take PO's at this time    Ventura Bruns Sahej Schrieber

## 2020-04-16 NOTE — Consult Note (Signed)
[facility]    Date of Admission:  04/14/2020           Day 1 ceftriaxone        Day 1 vancomycin       Reason for Consult: Automatic consultation for MRSA bacteremia     Assessment: He has MRSA bacteremia of uncertain source.  His admission exam does not mention any open wounds.  He had no evidence of pneumonia on chest x-ray and his UA does not suggest a symptomatic urinary tract infection.  He was recently hospitalized and probably had IVs at that time.  Repeat blood cultures have been ordered.  I will order a transthoracic echocardiogram.  I will stop ceftriaxone now.  Plan: 1. Continue vancomycin 2. Await results of repeat blood cultures and TTE  Principal Problem:   MRSA bacteremia Active Problems:   Acute metabolic encephalopathy   COPD (chronic obstructive pulmonary disease) ([name])   Hypertension   Pulmonary fibrosis ([name])   Hypercholesterolemia   DM type 2 causing vascular disease ([name])   Chronic diastolic CHF (congestive heart failure) ([name])   AF (paroxysmal atrial fibrillation) ([name])   Normocytic anemia   Acute kidney injury ([place])   Scheduled Meds: . apixaban  5 mg Oral BID  . cloNIDine  0.1 mg Oral Daily  . folic acid  1 mg Oral Daily  . insulin aspart  0-20 Units Subcutaneous TID WC  . insulin aspart  0-5 Units Subcutaneous QHS  . insulin aspart  3 Units Subcutaneous TID WC  . insulin glargine  20 Units Subcutaneous QHS  . metoprolol tartrate  25 mg Oral BID  . mometasone-formoterol  2 puff Inhalation BID  . pravastatin  80 mg Oral QHS  . sodium chloride flush  3 mL Intravenous Q12H  . thiamine  100 mg Oral Daily   Continuous Infusions: . sodium chloride 50 mL/hr at 04/15/20 1930  . sodium chloride    . cefTRIAXone (ROCEPHIN)  IV 2 g (04/16/20 0820)  . vancomycin     PRN Meds:.sodium chloride, acetaminophen **OR** acetaminophen, albuterol, dextrose, haloperidol lactate, LORazepam, metoprolol tartrate, ondansetron  **OR** ondansetron (ZOFRAN) IV, polyethylene glycol, sodium chloride flush  HPI: Edward [name] is a 85 y.o. male with multiple medical problems who was admitted recently with acute encephalopathy.  1 of 2 admission blood cultures has grown MRSA.   Review of Systems: Review of Systems  Unable to perform ROS: Other  Constitutional:       This is a remote consultation so no review of systems was obtained.    Past Medical History:  Diagnosis Date  . Allergy    Rhinitis  . Atrial fibrillation ([place])   . Bronchitis   . Chronic respiratory failure ([place])   . Colon polyps   . COPD (chronic obstructive pulmonary disease) ([place])   . Diabetes mellitus   . Elevated lipids   . Hypercholesterolemia   . Hypertension   . Iron deficiency anemia due to chronic blood loss 04/30/2018  . Noncompliance   . On home O2    2L N/C   . PSA elevation   . Pulmonary fibrosis ([place])   . Vitamin D deficiency     Social History   Tobacco Use  . Smoking status: Former Smoker    Packs/day: 1.50    Years: 60.00    Pack years: 90.00    Types: Cigarettes    Quit date: 12/31/2012  Years since quitting: 7.2  . Smokeless tobacco: Never Used  Vaping Use  . Vaping Use: Never used  Substance Use Topics  . Alcohol use: No  . Drug use: No    Family History  Problem Relation Age of Onset  . Heart disease Mother   . CAD Other   . Diabetes Other    Allergies  Allergen Reactions  . Ace Inhibitors Other (See Comments)    Hyperkalemia--07/23/2013:patient states not familiar with the following allergy    OBJECTIVE: Blood pressure 132/71, pulse (!) 128, temperature 100.1 F (37.8 C), temperature source Oral, resp. rate 18, weight 77.1 kg, SpO2 100 %.  Physical Exam Constitutional:      Comments: This is a remote consultation so no physical examination was performed.     Lab Results Lab Results  Component Value Date   WBC 14.6 (H) 04/16/2020   HGB 11.7 (L) 04/16/2020   HCT 39.1 04/16/2020   MCV  98.2 04/16/2020   PLT 283 04/16/2020    Lab Results  Component Value Date   CREATININE 1.47 (H) 04/16/2020   BUN 42 (H) 04/16/2020   NA 156 (H) 04/16/2020   K 3.2 (L) 04/16/2020   CL 110 04/16/2020   CO2 27 04/16/2020    Lab Results  Component Value Date   ALT 13 04/16/2020   AST 20 04/16/2020   ALKPHOS 76 04/16/2020   BILITOT 0.5 04/16/2020     Microbiology: Recent Results (from the past 240 hour(s))  SARS Coronavirus 2 by RT PCR (hospital order, performed in [place] hospital lab) Nasopharyngeal Nasopharyngeal Swab     Status: None   Collection Time: 04/14/20 10:31 AM   Specimen: Nasopharyngeal Swab  Result Value Ref Range Status   SARS Coronavirus 2 NEGATIVE NEGATIVE Final    Comment: (NOTE) SARS-CoV-2 target nucleic acids are NOT DETECTED.  The SARS-CoV-2 RNA is generally detectable in upper and lower respiratory specimens during the acute phase of infection. The lowest concentration of SARS-CoV-2 viral copies this assay can detect is 250 copies / mL. A negative result does not preclude SARS-CoV-2 infection and should not be used as the sole basis for treatment or other patient management decisions.  A negative result may occur with improper specimen collection / handling, submission of specimen other than nasopharyngeal swab, presence of viral mutation(s) within the areas targeted by this assay, and inadequate number of viral copies (<250 copies / mL). A negative result must be combined with clinical observations, patient history, and epidemiological information.  Fact Sheet for Patients:   StrictlyIdeas.no  Fact Sheet for Healthcare Providers: BankingDealers.co.za  This test is not yet approved or  cleared by the [place] FDA and has been authorized for detection and/or diagnosis of SARS-CoV-2 by FDA under an Emergency Use Authorization (EUA).  This EUA will remain in effect (meaning this test can be used)  for the duration of the COVID-19 declaration under Section 564(b)(1) of the Act, 21 U.S.C. section 360bbb-3(b)(1), unless the authorization is terminated or revoked sooner.  Performed at [facility], [address] [place], [place] [address]   Culture, blood (Routine X 2) w Reflex to ID Panel     Status: None (Preliminary result)   Collection Time: 04/15/20  8:34 AM   Specimen: BLOOD RIGHT HAND  Result Value Ref Range Status   Specimen Description   Final    BLOOD RIGHT HAND Performed at [facility], [address] [place], [place] [address]    Special Requests  Final    BOTTLES DRAWN AEROBIC AND ANAEROBIC Blood Culture adequate volume Performed at [facility], [address], [place], [place] [address]    Culture  Setup Time   Final    GRAM POSITIVE COCCI IN CLUSTERS CRITICAL RESULT CALLED TO, READ BACK BY AND VERIFIED WITH: Rhew,J at [place] on 04/16/20 by [place],S. AEROBIC BOTTLE ONLY Organism ID to follow CRITICAL RESULT CALLED TO, READ BACK BY AND VERIFIED WITH: Cloyde Reams [id number] MLM Performed at [facility], [place] [address], [place], [place] [address]    Culture GRAM POSITIVE COCCI  Final   Report Status PENDING  Incomplete  Culture, blood (Routine X 2) w Reflex to ID Panel     Status: None (Preliminary result)   Collection Time: 04/15/20  8:34 AM   Specimen: Left Antecubital; Blood  Result Value Ref Range Status   Specimen Description LEFT ANTECUBITAL BLOOD  Final   Special Requests   Final    BOTTLES DRAWN AEROBIC AND ANAEROBIC Blood Culture adequate volume   Culture   Final    NO GROWTH < 24 HOURS Performed at [facility], [address] [place], [place] [address]    Report Status PENDING  Incomplete  Blood Culture ID Panel (Reflexed)     Status: Abnormal   Collection Time: 04/15/20  8:34 AM  Result Value Ref Range Status   Enterococcus species NOT DETECTED NOT DETECTED Final   Listeria monocytogenes NOT DETECTED NOT DETECTED Final    Staphylococcus species DETECTED (A) NOT DETECTED Final    Comment: CRITICAL RESULT CALLED TO, READ BACK BY AND VERIFIED WITH: PHARMD S HURTH [phone] MLM    Staphylococcus aureus (BCID) DETECTED (A) NOT DETECTED Final    Comment: Methicillin (oxacillin)-resistant Staphylococcus aureus (MRSA). MRSA is predictably resistant to beta-lactam antibiotics (except ceftaroline). Preferred therapy is vancomycin unless clinically contraindicated. Patient requires contact precautions if  hospitalized. CRITICAL RESULT CALLED TO, READ BACK BY AND VERIFIED WITH: PHARMD S HURTH [phone] MLM    Methicillin resistance DETECTED (A) NOT DETECTED Final    Comment: CRITICAL RESULT CALLED TO, READ BACK BY AND VERIFIED WITH: PHARMD S HURTH [phone] MLM    Streptococcus species NOT DETECTED NOT DETECTED Final   Streptococcus agalactiae NOT DETECTED NOT DETECTED Final   Streptococcus pneumoniae NOT DETECTED NOT DETECTED Final   Streptococcus pyogenes NOT DETECTED NOT DETECTED Final   Acinetobacter baumannii NOT DETECTED NOT DETECTED Final   Enterobacteriaceae species NOT DETECTED NOT DETECTED Final   Enterobacter cloacae complex NOT DETECTED NOT DETECTED Final   Escherichia coli NOT DETECTED NOT DETECTED Final   Klebsiella oxytoca NOT DETECTED NOT DETECTED Final   Klebsiella pneumoniae NOT DETECTED NOT DETECTED Final   Proteus species NOT DETECTED NOT DETECTED Final   Serratia marcescens NOT DETECTED NOT DETECTED Final   Haemophilus influenzae NOT DETECTED NOT DETECTED Final   Neisseria meningitidis NOT DETECTED NOT DETECTED Final   Pseudomonas aeruginosa NOT DETECTED NOT DETECTED Final   Candida albicans NOT DETECTED NOT DETECTED Final   Candida glabrata NOT DETECTED NOT DETECTED Final   Candida krusei NOT DETECTED NOT DETECTED Final   Candida parapsilosis NOT DETECTED NOT DETECTED Final   Candida tropicalis NOT DETECTED NOT DETECTED Final    Comment: Performed at [facility], [place].  [address] [place],  [address]  Culture, blood (Routine X 2) w Reflex to ID Panel     Status: None (Preliminary result)   Collection Time: 04/16/20  8:48  AM   Specimen: BLOOD RIGHT ARM  Result Value Ref Range Status   Specimen Description BLOOD RIGHT ARM  Final   Special Requests   Final    BOTTLES DRAWN AEROBIC AND ANAEROBIC Blood Culture adequate volume Performed at [facility], [address], [place], [place] [address]    Culture PENDING  Incomplete   Report Status PENDING  Incomplete  Culture, blood (Routine X 2) w Reflex to ID Panel     Status: None (Preliminary result)   Collection Time: 04/16/20  8:56 AM   Specimen: BLOOD RIGHT HAND  Result Value Ref Range Status   Specimen Description BLOOD RIGHT HAND  Final   Special Requests   Final    BOTTLES DRAWN AEROBIC AND ANAEROBIC Blood Culture adequate volume Performed at [facility], [address], [place], [place] [address]    Culture PENDING  Incomplete   Report Status PENDING  Incomplete    Michel Bickers, MD [facility] [phone] pager   [phone] cell 04/16/2020, 2:41 PM

## 2020-04-16 NOTE — Progress Notes (Signed)
   04/16/20 0848  Assess: MEWS Score  BP (!) 156/72  Assess: MEWS Score  MEWS Temp 0  MEWS Systolic 0  MEWS Pulse 2  MEWS RR 0  MEWS LOC 0  MEWS Score 2  MEWS Score Color Yellow  Assess: if the MEWS score is Yellow or Red  Were vital signs taken at a resting state? Yes  Focused Assessment Documented focused assessment  Early Detection of Sepsis Score *See Row Information* Low  MEWS guidelines implemented *See Row Information* Yes  Treat  MEWS Interventions Administered scheduled meds/treatments;Administered prn meds/treatments  Take Vital Signs  Increase Vital Sign Frequency  Yellow: Q 2hr X 2 then Q 4hr X 2, if remains yellow, continue Q 4hrs  Escalate  MEWS: Escalate Yellow: discuss with charge nurse/RN and consider discussing with provider and RRT  Notify: Charge Nurse/RN  Name of Charge Nurse/RN Notified S. Owens Shark  Date Charge Nurse/RN Notified 04/16/20  Time Charge Nurse/RN Notified [phone]  Notify: Provider  Provider Name/Title  ([facility])  Date Provider Notified 04/16/20  Time Provider Notified [phone]  Notification Type Page  Notification Reason Change in status  Response No new orders  Date of Provider Response 04/16/20  Time of Provider Response [phone]  Document  Patient Outcome  (continue to monitor)  Progress note created (see row info) Yes

## 2020-04-16 NOTE — Progress Notes (Signed)
Patient climbing out of bed. Refusing to keep on telemetry monitor and oxygen nasal cannula. Patient repositioned in bed. Oxygen and telemetry placed back on patient. PRN Ativan given for agitation. Patient educated to call for assistance.No evidence of learning. Fall mats in place. Bed alarm on. Call bell within reach.

## 2020-04-16 NOTE — Progress Notes (Signed)
Patient continues to remain in a Yellow [place] d/t heart rate. Per Dr. Joesph Fillers, every 4 hour vitals not required. If patient enters in the Yellow MEWS again due to HR increase no greater than 130 no additional MEWS documentation is necessary  Elodia Florence RN 04/17/2020 @1907 

## 2020-04-16 NOTE — Progress Notes (Addendum)
°   04/15/20 2153  Vitals  Temp 100 F (37.8 C)  BP (!) 143/96  MAP (mmHg) 112  BP Location Right Arm  BP Method Automatic  Patient Position (if appropriate) Lying  Pulse Rate (!) 117  Pulse Rate Source Monitor  Resp (!) 21  Oxygen Therapy  SpO2 94 %  O2 Device Nasal Cannula  O2 Flow Rate (L/min) 3 L/min  MEWS Score  MEWS Temp 0  MEWS Systolic 0  MEWS Pulse 2  MEWS RR 1  MEWS LOC 1  MEWS Score 4  MEWS Score Color Red  Provider Notification  Provider Name/Title Adefeso   Date Provider Notified 04/15/20  Notification Type Call   Patient lying in bed, responds to voice. Disoriented x4. Noted to have tremors. Patient congested, frequent cough. Redness and swelling noted to nose and left eye. Patient still unable to take PO meds, MD aware. New orders placed.  Patient given Tylenol for temp.

## 2020-04-16 NOTE — Progress Notes (Signed)
Pharmacy Antibiotic Note  Edward [name] is a 85 y.o. male admitted on 04/14/2020 with MRSA bacteremia.  Pharmacy has been consulted for vancomycin dosing.     Plan: Vancomycin 1.75 g  IV x1 dose, then vancomycin 750mg  IV q24h Goal vancomycin trough range:  15-20 mcg/mL Pharmacy will continue to monitor renal function, vancomycin troughs as clinically appropriate,  cultures and patient progress  Weight: 77.1 kg (170 lb)  Temp (24hrs), Avg:99.1 F (37.3 C), Min:98 F (36.7 C), Max:100.1 F (37.8 C)  Recent Labs  Lab 04/14/20 0935 04/14/20 0935 04/14/20 1146 04/14/20 1524 04/14/20 1948 04/15/20 0010 04/15/20 0425 04/15/20 0834 04/16/20 0703  WBC 12.7*  --  14.6*  --   --   --  15.7*  --  14.6*  CREATININE 1.49*   < > 1.56*   < > 1.26* 1.22 1.28* 1.30* 1.47*  LATICACIDVEN 2.0*  --   --   --   --   --   --   --   --    < > = values in this interval not displayed.    Estimated Creatinine Clearance: 37.1 mL/min (A) (by C-G formula based on SCr of 1.47 mg/dL (H)).    Allergies  Allergen Reactions  . Ace Inhibitors Other (See Comments)    Hyperkalemia--07/23/2013:patient states not familiar with the following allergy    Antimicrobials this admission: vancomycin 7/16 >> ceftriaxone 7/16>>7/16   Dose adjustments this admission: vancomycin  Microbiology results: 7/16 BCx2: NG x24h 7/16 UCx:   7/15 BC x2: BCID= MRSA in 1 of 2 bottles    Thank you for allowing pharmacy to be a part of this patient's care.  Despina Pole 04/16/2020 3:37 PM

## 2020-04-16 NOTE — Progress Notes (Signed)
Critical lab: Blood culture  Aerobic -gram positive cocci in clusters Notified MD. Elodia Florence RN

## 2020-04-17 ENCOUNTER — Inpatient Hospital Stay (HOSPITAL_COMMUNITY): Payer: Medicare Other

## 2020-04-17 DIAGNOSIS — R7881 Bacteremia: Secondary | ICD-10-CM

## 2020-04-17 LAB — ECHOCARDIOGRAM COMPLETE
Area-P 1/2: 2.63 cm2
S' Lateral: 2.13 cm
Weight: 2720 oz

## 2020-04-17 LAB — URINE CULTURE: Special Requests: NORMAL

## 2020-04-17 LAB — GLUCOSE, CAPILLARY
Glucose-Capillary: 257 mg/dL — ABNORMAL HIGH (ref 70–99)
Glucose-Capillary: 85 mg/dL (ref 70–99)
Glucose-Capillary: 117 mg/dL — ABNORMAL HIGH (ref 70–99)
Glucose-Capillary: 197 mg/dL — ABNORMAL HIGH (ref 70–99)

## 2020-04-17 NOTE — Progress Notes (Signed)
  Echocardiogram 2D Echocardiogram has been performed.  Edward [name] 04/17/2020, 10:08 AM

## 2020-04-17 NOTE — Progress Notes (Signed)
Patient Demographics:    Edward [name], is a 85 y.o. male, DOB - 05-29-1935, WNU:[id number]  Admit date - 04/14/2020   Admitting Physician Datrell Dunton Denton Brick, MD  Outpatient Primary MD for the patient is [name], [name], MD  LOS - 1  Chief Complaint  Patient presents with   Hyperglycemia        Subjective:    Edward [name] today has no fevers, no emesis,  No chest pain,    -Somewhat less confused, tolerating oral intake better, still mostly uncooperative -Son at bedside, son is unable to administer insulin to his father  Assessment  & Plan :    Principal Problem:   MRSA bacteremia Active Problems:   Acute metabolic encephalopathy   COPD (chronic obstructive pulmonary disease) ([place])   Hypertension   Pulmonary fibrosis ([place])   Hypercholesterolemia   DM type 2 causing vascular disease ([place])   Chronic diastolic CHF (congestive heart failure) ([name])   AF (paroxysmal atrial fibrillation) ([name])   Normocytic anemia   Acute kidney injury ([place])   1)MRSA bacteremia--continue IV vancomycin started on 04/16/2020 -Echo is suboptimal quality due to poor cooperation from patient, valves are not well visualized, EF is 60 to 65% Microbiology results: 7/16 BCx2:NG x24h 7/16 UCx:  7/15 BC x2: BCID= MRSA in 1 of 2 bottles   2) acute metabolic encephalopathy in the setting of hyperglycemia and MRSA bacteremia--continue to hydrate, continue to monitor sugar levels -UA not suggestive of UTI Leukocytosis white count down to 14.6 from 15.7   --Hold Lyrica -Use Haldol and lorazepam as needed agitation -As per family members cognitively patient is way far from baseline  2) chronic hypoxic respiratory failure--continue supplemental oxygen- -- ABG w/o hypercapnia   -Clinically and radiologically no pneumonia   3)AKI--due to dehydration and hyperglycemia, baseline creatinine is usually between 0.9 and 1,  creatinine is 1.56 -Creatinine is down to 1.47  , renally adjust medications, avoid nephrotoxic agents / dehydration  / hypotension -Hold Lasix, -Hold Metformin  4)PAFIb--stable, continue metoprolol for rate control, continue Eliquis for stroke prophylaxis -Tachycardia noted as patient refused metoprolol earlier  5)COPD-no acute exacerbation at this time, continue bronchodilators,   6) uncontrolled DM--A1c 8.9, decrease Lantus insulin to 20 units from 30 units due to episodes of hypoglycemia  insulin and IV fluids as ordered -Hold Metformin due to dehydration and AKI   7)HyperNatremia--may be due to IV fluids with normal saline--free water deficit -Change IV fluids to half-normal saline =-Patient not drinking enough  Disposition/Need for in-Hospital Stay- patient unable to be discharged at this time due to --- -MRSA bacteremia requiring IV antibiotics, hypernatremia requiring IV fluids, acute metabolic encephalopathy which is still quite far from baseline from him cognitive standpoint requiring further improvement prior to discharge  Dispo: The patient is from: Home  Anticipated d/c is to:   SNF  Anticipated d/c date is: 1 day  Patient currently is not medically stable to d/c. Barriers: Not Clinically Stable- - -MRSA bacteremia requiring IV antibiotics, hypernatremia requiring IV fluids, acute metabolic encephalopathy which is still quite far from baseline from him cognitive standpoint requiring further improvement prior to discharge   Status is: Inpatient  Remains inpatient appropriate because:Metabolic encephalopathy requiring further investigation and IV fluids MRSA bacteremia requiring IV  antibiotics, hypernatremia requiring IV fluids, acute metabolic encephalopathy which is still quite far from baseline from him cognitive standpoint requiring further improvement prior to discharge   Code Status : Full  Family Communication:    Discussed  with patient's wife and son at bedside Consults  :  na DVT Prophylaxis  : Eliquis Lab Results  Component Value Date   PLT 283 04/16/2020    Inpatient Medications  Scheduled Meds:  apixaban  5 mg Oral BID   cloNIDine  0.1 mg Oral Daily   folic acid  1 mg Oral Daily   insulin aspart  0-20 Units Subcutaneous TID WC   insulin aspart  0-5 Units Subcutaneous QHS   insulin aspart  3 Units Subcutaneous TID WC   insulin glargine  20 Units Subcutaneous QHS   metoprolol tartrate  50 mg Oral BID   mometasone-formoterol  2 puff Inhalation BID   pravastatin  80 mg Oral QHS   sodium chloride flush  3 mL Intravenous Q12H   thiamine  100 mg Oral Daily   Continuous Infusions:  sodium chloride 50 mL/hr at 04/15/20 1930   sodium chloride     vancomycin 750 mg (04/17/20 1443)   PRN Meds:.sodium chloride, acetaminophen **OR** acetaminophen, albuterol, dextrose, haloperidol lactate, LORazepam, metoprolol tartrate, ondansetron **OR** ondansetron (ZOFRAN) IV, polyethylene glycol, sodium chloride flush    Anti-infectives (From admission, onward)   Start     Dose/Rate Route Frequency Ordered Stop   04/17/20 1500  vancomycin (VANCOREADY) IVPB 750 mg/150 mL     Discontinue     750 mg 150 mL/hr over 60 Minutes Intravenous Every 24 hours 04/16/20 1537     04/16/20 1500  vancomycin (VANCOREADY) IVPB 1750 mg/350 mL        1,750 mg 175 mL/hr over 120 Minutes Intravenous  Once 04/16/20 1413 04/17/20 0752   04/16/20 0830  cefTRIAXone (ROCEPHIN) 2 g in sodium chloride 0.9 % 100 mL IVPB  Status:  Discontinued        2 g 200 mL/hr over 30 Minutes Intravenous Every 24 hours 04/16/20 0805 04/16/20 1447        Objective:   Vitals:   04/16/20 2109 04/17/20 0600 04/17/20 0744 04/17/20 1623  BP: 136/73 (!) 151/80  (!) 144/79  Pulse: 92 (!) 102  90  Resp: 16   19  Temp: 98.2 F (36.8 C) 98.7 F (37.1 C)  98 F (36.7 C)  TempSrc: Oral     SpO2: 100%  93%   Weight:        Wt Readings  from Last 3 Encounters:  04/14/20 77.1 kg  03/24/20 77.6 kg  03/18/20 78 kg     Intake/Output Summary (Last 24 hours) at 04/17/2020 1856 Last data filed at 04/17/2020 1700 Gross per 24 hour  Intake 360 ml  Output --  Net 360 ml     Physical Exam  Gen:- Awake, somewhat less confused  mental status -improving disorientation  nose- [place] 2L/min Eyes - sclera anicteric Neck - supple, no JVD elevation , Chest - clear  to auscultation bilaterally, symmetrical air movement,  Heart - S1 and S2 normal, regular  Abdomen - soft, nontender, nondistended,  NeuroPsych--able to follow commands, somewhat less confused extremities - no pedal edema noted, intact peripheral pulses  Skin - warm, dry   Data Review:   Micro Results Recent Results (from the past 240 hour(s))  SARS Coronavirus 2 by RT PCR (hospital order, performed in [facility] hospital lab) Nasopharyngeal Nasopharyngeal Swab  Status: None   Collection Time: 04/14/20 10:31 AM   Specimen: Nasopharyngeal Swab  Result Value Ref Range Status   SARS Coronavirus 2 NEGATIVE NEGATIVE Final    Comment: (NOTE) SARS-CoV-2 target nucleic acids are NOT DETECTED.  The SARS-CoV-2 RNA is generally detectable in upper and lower respiratory specimens during the acute phase of infection. The lowest concentration of SARS-CoV-2 viral copies this assay can detect is 250 copies / mL. A negative result does not preclude SARS-CoV-2 infection and should not be used as the sole basis for treatment or other patient management decisions.  A negative result may occur with improper specimen collection / handling, submission of specimen other than nasopharyngeal swab, presence of viral mutation(s) within the areas targeted by this assay, and inadequate number of viral copies (<250 copies / mL). A negative result must be combined with clinical observations, patient history, and epidemiological information.  Fact Sheet for Patients:    StrictlyIdeas.no  Fact Sheet for Healthcare Providers: BankingDealers.co.za  This test is not yet approved or  cleared by the [place] FDA and has been authorized for detection and/or diagnosis of SARS-CoV-2 by FDA under an Emergency Use Authorization (EUA).  This EUA will remain in effect (meaning this test can be used) for the duration of the COVID-19 declaration under Section 564(b)(1) of the Act, 21 U.S.C. section 360bbb-3(b)(1), unless the authorization is terminated or revoked sooner.  Performed at [facility], [address], [place], [place] [address]   Culture, blood (Routine X 2) w Reflex to ID Panel     Status: Abnormal (Preliminary result)   Collection Time: 04/15/20  8:34 AM   Specimen: BLOOD RIGHT HAND  Result Value Ref Range Status   Specimen Description   Final    BLOOD RIGHT HAND Performed at [facility], [address], [place], [place] [address]    Special Requests   Final    BOTTLES DRAWN AEROBIC AND ANAEROBIC Blood Culture adequate volume Performed at [facility], [address] [place], [place] [address]    Culture  Setup Time   Final    GRAM POSITIVE COCCI IN CLUSTERS CRITICAL RESULT CALLED TO, READ BACK BY AND VERIFIED WITH: Rhew,J at [place] on 04/16/20 by [place]ines,S. AEROBIC BOTTLE ONLY Organism ID to follow CRITICAL RESULT CALLED TO, READ BACK BY AND VERIFIED WITH: PHARMD S HURTH [id number] MLM GRAM POSITIVE COCCI ANAEROBIC BOTTLE ONLY RESULT PREV. CALLED Performed at [facility], [address], [place], [place] [address]    Culture STAPHYLOCOCCUS AUREUS (A)  Final   Report Status PENDING  Incomplete  Culture, blood (Routine X 2) w Reflex to ID Panel     Status: None (Preliminary result)   Collection Time: 04/15/20  8:34 AM   Specimen: Left Antecubital; Blood  Result Value Ref Range Status   Specimen Description LEFT ANTECUBITAL BLOOD  Final   Special Requests   Final    BOTTLES DRAWN AEROBIC  AND ANAEROBIC Blood Culture adequate volume   Culture   Final    NO GROWTH 2 DAYS Performed at [facility], [address], [place], [place] [address]    Report Status PENDING  Incomplete  Blood Culture ID Panel (Reflexed)     Status: Abnormal   Collection Time: 04/15/20  8:34 AM  Result Value Ref Range Status   Enterococcus species NOT DETECTED NOT DETECTED Final   Listeria monocytogenes NOT DETECTED NOT DETECTED Final   Staphylococcus species DETECTED (A) NOT DETECTED Final    Comment: CRITICAL RESULT  CALLED TO, READ BACK BY AND VERIFIED WITH: PHARMD S HURTH [id number] MLM    Staphylococcus aureus (BCID) DETECTED (A) NOT DETECTED Final    Comment: Methicillin (oxacillin)-resistant Staphylococcus aureus (MRSA). MRSA is predictably resistant to beta-lactam antibiotics (except ceftaroline). Preferred therapy is vancomycin unless clinically contraindicated. Patient requires contact precautions if  hospitalized. CRITICAL RESULT CALLED TO, READ BACK BY AND VERIFIED WITH: PHARMD S HURTH [id number] MLM    Methicillin resistance DETECTED (A) NOT DETECTED Final    Comment: CRITICAL RESULT CALLED TO, READ BACK BY AND VERIFIED WITH: PHARMD S HURTH [id number] 1406 MLM    Streptococcus species NOT DETECTED NOT DETECTED Final   Streptococcus agalactiae NOT DETECTED NOT DETECTED Final   Streptococcus pneumoniae NOT DETECTED NOT DETECTED Final   Streptococcus pyogenes NOT DETECTED NOT DETECTED Final   Acinetobacter baumannii NOT DETECTED NOT DETECTED Final   Enterobacteriaceae species NOT DETECTED NOT DETECTED Final   Enterobacter cloacae complex NOT DETECTED NOT DETECTED Final   Escherichia coli NOT DETECTED NOT DETECTED Final   Klebsiella oxytoca NOT DETECTED NOT DETECTED Final   Klebsiella pneumoniae NOT DETECTED NOT DETECTED Final   Proteus species NOT DETECTED NOT DETECTED Final   Serratia marcescens NOT DETECTED NOT DETECTED Final   Haemophilus influenzae NOT DETECTED NOT DETECTED Final    Neisseria meningitidis NOT DETECTED NOT DETECTED Final   Pseudomonas aeruginosa NOT DETECTED NOT DETECTED Final   Candida albicans NOT DETECTED NOT DETECTED Final   Candida glabrata NOT DETECTED NOT DETECTED Final   Candida krusei NOT DETECTED NOT DETECTED Final   Candida parapsilosis NOT DETECTED NOT DETECTED Final   Candida tropicalis NOT DETECTED NOT DETECTED Final    Comment: Performed at [facility], [place]. [address], [place], [place] [address]  Urine Culture     Status: Abnormal   Collection Time: 04/16/20  4:21 AM   Specimen: Urine, Catheterized  Result Value Ref Range Status   Specimen Description   Final    URINE, CATHETERIZED Performed at [facility], [address] [place], [place] [address]    Special Requests   Final    Normal Performed at [facility], [address], [place], [place] [address]    Culture MULTIPLE SPECIES PRESENT, SUGGEST RECOLLECTION (A)  Final   Report Status 04/17/2020 FINAL  Final  Culture, blood (Routine X 2) w Reflex to ID Panel     Status: None (Preliminary result)   Collection Time: 04/16/20  8:48 AM   Specimen: BLOOD RIGHT ARM  Result Value Ref Range Status   Specimen Description BLOOD RIGHT ARM  Final   Special Requests   Final    BOTTLES DRAWN AEROBIC AND ANAEROBIC Blood Culture adequate volume   Culture   Final    NO GROWTH < 24 HOURS Performed at [facility], [address], [place], [place] [address]    Report Status PENDING  Incomplete  Culture, blood (Routine X 2) w Reflex to ID Panel     Status: None (Preliminary result)   Collection Time: 04/16/20  8:56 AM   Specimen: BLOOD RIGHT HAND  Result Value Ref Range Status   Specimen Description BLOOD RIGHT HAND  Final   Special Requests   Final    BOTTLES DRAWN AEROBIC AND ANAEROBIC Blood Culture adequate volume   Culture   Final    NO GROWTH < 24 HOURS Performed at [facility], [address], [place], [place] [address]    Report Status PENDING  Incomplete  Radiology  Reports CT Head Wo Contrast  Result Date: 03/23/2020 CLINICAL DATA:  85 year old male with neurologic deficit. EXAM: CT HEAD WITHOUT CONTRAST TECHNIQUE: Contiguous axial images were obtained from the base of the skull through the vertex without intravenous contrast. COMPARISON:  Head CT dated 01/19/2020. FINDINGS: Brain: Mild age-related atrophy and chronic microvascular ischemic changes. There is no acute intracranial hemorrhage. No mass effect or midline shift. No extra-axial fluid collection. Vascular: No hyperdense vessel or unexpected calcification. Skull: Normal. Negative for fracture or focal lesion. Sinuses/Orbits: Mild mucoperiosteal thickening of paranasal sinuses. No air-fluid level. The mastoid air cells are clear. Other: None IMPRESSION: 1. No acute intracranial pathology. 2. Mild age-related atrophy and chronic microvascular ischemic changes. Electronically Signed   By: Anner Crete M.D.   On: 03/23/2020 19:31   DG Chest Port 1 View  Result Date: 04/14/2020 CLINICAL DATA:  Shortness of breath EXAM: PORTABLE CHEST 1 VIEW COMPARISON:  03/23/2020 FINDINGS: Mild interstitial prominence throughout the lungs, likely chronic. Heart and mediastinal contours are within normal limits. No focal opacities or effusions. No acute bony abnormality. IMPRESSION: No active disease. Electronically Signed   By: Rolm Baptise M.D.   On: 04/14/2020 09:57   DG Chest Port 1 View  Result Date: 03/23/2020 CLINICAL DATA:  85 year old male with wheezing. EXAM: PORTABLE CHEST 1 VIEW COMPARISON:  Chest radiograph dated 01/19/2020. FINDINGS: Mild diffuse interstitial prominence and nodularity, chronic. No new consolidative changes. There is no pleural effusion pneumothorax. Stable cardiac silhouette. Atherosclerotic calcification of the aorta. No acute osseous pathology. IMPRESSION: No interval change.  No new consolidation. Electronically Signed   By: Anner Crete M.D.   On: 03/23/2020 18:49   ECHOCARDIOGRAM  COMPLETE  Result Date: 04/17/2020    ECHOCARDIOGRAM REPORT   Patient Name:   ELOISE MULA Ramanathan Date of Exam: 04/17/2020 Medical Rec #:  [id number]      Height:       65.0 in Accession #:    [id number]     Weight:       170.0 lb Date of Birth:  1935-01-28      BSA:          1.846 m Patient Age:    41 years       BP:           151/80 mmHg Patient Gender: M              HR:           97 bpm. Exam Location:  Inpatient Procedure: 2D Echo Indications:    790.7 bacteremia  History:        Patient has prior history of Echocardiogram examinations, most                 recent 09/23/2017. COPD, Arrythmias:Atrial Fibrillation; Risk                 Factors:Diabetes, Hypertension, Dyslipidemia and Former Smoker.                 Chronic respiratory failure.  Sonographer:    Jannett Celestine RDCS (AE) Referring Phys: [id number] [facility]  Sonographer Comments: Image acquisition challenging due to uncooperative patient and Image acquisition challenging due to respiratory motion. restricted mobility. patient had difficulty remaining still & was coughing often. unable to follow breathing instructions properly IMPRESSIONS  1. Left ventricular ejection fraction, by estimation, is 65 to 70%. The left ventricle has normal function. Left ventricular endocardial border not optimally defined to evaluate regional wall  motion. Left ventricular diastolic parameters are indeterminate.  2. Right ventricular systolic function is normal. The right ventricular size is normal.  3. The mitral valve is abnormal. Trivial mitral valve regurgitation.  4. AV is thickened, calcified There appears on 2 D to have restricted motion. Images are difficult/poor. No gradient through the valve. Compared report form december 2018, AV appeared mildly narrowed at that time     Would recomm limited echo in future to complete assessment of valve when patient can be brought down to echo suite for nonportable exam.. The aortic valve was not well visualized. Aortic valve  regurgitation is not visualized.  5. The inferior vena cava is normal in size with greater than 50% respiratory variability, suggesting right atrial pressure of 3 mmHg. FINDINGS  Left Ventricle: Left ventricular ejection fraction, by estimation, is 65 to 70%. The left ventricle has normal function. Left ventricular endocardial border not optimally defined to evaluate regional wall motion. The left ventricular internal cavity size was small. There is no left ventricular hypertrophy. Left ventricular diastolic parameters are indeterminate. Right Ventricle: The right ventricular size is normal. Right vetricular wall thickness was not assessed. Right ventricular systolic function is normal. Left Atrium: Left atrial size was normal in size. Right Atrium: Right atrial size was normal in size. Pericardium: There is no evidence of pericardial effusion. Mitral Valve: The mitral valve is abnormal. There is mild thickening of the mitral valve leaflet(s). Mild mitral annular calcification. Trivial mitral valve regurgitation. Tricuspid Valve: The tricuspid valve is grossly normal. Tricuspid valve regurgitation is trivial. Aortic Valve: AV is thickened, calcified There appears on 2 D to have restricted motion. Images are difficult/poor. No gradient through the valve. Compared report form december 2018, AV appeared mildly narrowed at that time Would recomm limited echo in future to complete assessment of valve when patient can be brought down to echo suite for nonportable exam. The aortic valve was not well visualized. Aortic valve regurgitation is not visualized. Pulmonic Valve: The pulmonic valve was not well visualized. Pulmonic valve regurgitation is not visualized. Aorta: The aortic root is normal in size and structure. Venous: The inferior vena cava is normal in size with greater than 50% respiratory variability, suggesting right atrial pressure of 3 mmHg. IAS/Shunts: The interatrial septum was not assessed.  LEFT VENTRICLE  PLAX 2D LVIDd:         3.07 cm  Diastology LVIDs:         2.13 cm  LV e' lateral:   9.79 cm/s LV PW:         1.12 cm  LV E/e' lateral: 6.1 LV IVS:        1.19 cm  LV e' medial:    4.24 cm/s LVOT diam:     2.00 cm  LV E/e' medial:  14.2 LV SV:         35 LV SV Index:   19 LVOT Area:     3.14 cm  LEFT ATRIUM           Index       RIGHT ATRIUM           Index LA diam:      3.40 cm 1.84 cm/m  RA Area:     10.40 cm LA Vol (A4C): 24.5 ml 13.27 ml/m RA Volume:   18.50 ml  10.02 ml/m  AORTIC VALVE LVOT Vmax:   74.40 cm/s LVOT Vmean:  57.100 cm/s LVOT VTI:    0.111 m  AORTA Ao Root  diam: 3.30 cm MITRAL VALVE MV Area (PHT): 2.63 cm    SHUNTS MV Decel Time: 288 msec    Systemic VTI:  0.11 m MV E velocity: 60.00 cm/s  Systemic Diam: 2.00 cm MV A velocity: 71.10 cm/s MV E/A ratio:  0.84 MV A Prime:    10.0 cm/s Dorris Carnes MD Electronically signed by Dorris Carnes MD Signature Date/Time: 04/17/2020/10:28:46 AM    Final      CBC Recent Labs  Lab 04/14/20 0935 04/14/20 1146 04/15/20 0425 04/16/20 0703  WBC 12.7* 14.6* 15.7* 14.6*  HGB 12.7* 11.9* 11.2* 11.7*  HCT 40.1 38.0* 35.1* 39.1  PLT 253 247 263 283  MCV 93.0 94.1 93.9 98.2  MCH 29.5 29.5 29.9 29.4  MCHC 31.7 31.3 31.9 29.9*  RDW 13.9 13.9 14.0 14.1  LYMPHSABS 1.3 1.3  --   --   MONOABS 0.9 1.1*  --   --   EOSABS 0.0 0.0  --   --   BASOSABS 0.0 0.0  --   --     Chemistries  Recent Labs  Lab 04/14/20 0935 04/14/20 1146 04/14/20 1948 04/15/20 0010 04/15/20 0425 04/15/20 0834 04/16/20 0703  NA 144   < > 150* 150* 149* 150* 156*  K 4.2   < > 3.0* 3.3* 3.3* 3.4* 3.2*  CL 97*   < > 109 109 109 109 110  CO2 29   < > 31 29 28 27 27   GLUCOSE 511*   < > 102* 95 175* 286* 119*  BUN 47*   < > 41* 41* 40* 42* 42*  CREATININE 1.49*   < > 1.26* 1.22 1.28* 1.30* 1.47*  CALCIUM 9.6   < > 9.0 9.0 9.0 9.2 9.8  MG 2.9*  --   --   --   --   --   --   AST 11*  --   --   --   --   --  20  ALT 14  --   --   --   --   --  13  ALKPHOS 90  --   --   --    --   --  76  BILITOT 0.8  --   --   --   --   --  0.5   < > = values in this interval not displayed.   ------------------------------------------------------------------------------------------------------------------ No results for input(s): CHOL, HDL, LDLCALC, TRIG, CHOLHDL, LDLDIRECT in the last 72 hours.  Lab Results  Component Value Date   HGBA1C 8.9 (H) 04/14/2020   ------------------------------------------------------------------------------------------------------------------ No results for input(s): TSH, T4TOTAL, T3FREE, THYROIDAB in the last 72 hours.  Invalid input(s): FREET3 ------------------------------------------------------------------------------------------------------------------ No results for input(s): VITAMINB12, FOLATE, FERRITIN, TIBC, IRON, RETICCTPCT in the last 72 hours.  Coagulation profile No results for input(s): INR, PROTIME in the last 168 hours.  No results for input(s): DDIMER in the last 72 hours.  Cardiac Enzymes No results for input(s): CKMB, TROPONINI, MYOGLOBIN in the last 168 hours.  Invalid input(s): CK ------------------------------------------------------------------------------------------------------------------    Component Value Date/Time   BNP 298.0 (H) 03/23/2020 1705   BNP 101 (H) 08/19/2018 1015     Roxan Hockey M.D on 04/17/2020 at 6:56 PM  Go to [email or web address] - for contact info  [facility] - Office  [phone]

## 2020-04-17 NOTE — Progress Notes (Signed)
Patient ID: EDNA GROVER, male   DOB: 1935/04/30, 85 y.o.   MRN: [id number]          [facility] for Infectious [facility]    Date of Admission:  04/14/2020   Day 2 vancomycin         Mr. Fuqua has MRSA bacteremia.  Repeat blood cultures are negative at 24 hours.  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  I would continue vancomycin and speak with his wife and son about goals of care before deciding on TEE next week.         Michel Bickers, MD [facility] for [facility] [phone] pager   [phone] cell 04/17/2020, 1:09 PM

## 2020-04-17 NOTE — Progress Notes (Addendum)
Patients son, [name], was at bedside to learn Insulin education and demonstrate his knowledge. Mr. Edward [name] was unable to draw up the insulin after education d/t not having his glasses. Requests made for him to return at additional insulin dosage times to adequately demonstrate efficiency.  Mr. Edward [name] was unable to administer insulin correctly.  Mr. Edward [name] was asked to return by  MD for  all additional administrations of insulin doses to continue with education, however, the son stated he could only return one time; 7/18 between 11-11:30. Diabetic education paperwork was provided to the family. At this time Mr. Edward [name] is not proficient in his knowledge nor his ability to effectively draw up his fathers assistance. Further teaching needed. Elodia Florence RN 04/17/2020 @1400 

## 2020-04-17 NOTE — Progress Notes (Signed)
Patient refused dulera mdi at 20:00.

## 2020-04-18 LAB — CULTURE, BLOOD (ROUTINE X 2): Special Requests: ADEQUATE

## 2020-04-18 LAB — MAGNESIUM: Magnesium: 2.4 mg/dL (ref 1.7–2.4)

## 2020-04-18 LAB — GLUCOSE, CAPILLARY
Glucose-Capillary: 142 mg/dL — ABNORMAL HIGH (ref 70–99)
Glucose-Capillary: 144 mg/dL — ABNORMAL HIGH (ref 70–99)
Glucose-Capillary: 64 mg/dL — ABNORMAL LOW (ref 70–99)
Glucose-Capillary: 79 mg/dL (ref 70–99)

## 2020-04-18 LAB — BASIC METABOLIC PANEL
Anion gap: 10 (ref 5–15)
BUN: 46 mg/dL — ABNORMAL HIGH (ref 8–23)
CO2: 25 mmol/L (ref 22–32)
Calcium: 8.5 mg/dL — ABNORMAL LOW (ref 8.9–10.3)
Chloride: 109 mmol/L (ref 98–111)
Creatinine, Ser: 1.4 mg/dL — ABNORMAL HIGH (ref 0.61–1.24)
GFR calc Af Amer: 54 mL/min — ABNORMAL LOW (ref >60)
GFR calc non Af Amer: 46 mL/min — ABNORMAL LOW (ref >60)
Glucose, Bld: 152 mg/dL — ABNORMAL HIGH (ref 70–99)
Potassium: 3.1 mmol/L — ABNORMAL LOW (ref 3.5–5.1)
Sodium: 144 mmol/L (ref 135–145)

## 2020-04-18 MED ORDER — POTASSIUM CHLORIDE CRYS ER 20 MEQ PO TBCR
40.0000 meq | EXTENDED_RELEASE_TABLET | ORAL | Status: AC
  Administered 2020-04-18 (×2): 40 meq via ORAL
  Filled 2020-04-18 (×2): qty 2

## 2020-04-18 NOTE — Progress Notes (Signed)
Patient ID: Edward [name], male   DOB: 1935-05-02, 85 y.o.   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  Repeat blood cultures are negative at 48 hours.  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  I would continue vancomycin and speak with his wife and son about goals of care before deciding on TEE.         Michel Bickers, MD [facility] [phone] pager   [phone] cell 04/18/2020, 10:07 AM

## 2020-04-18 NOTE — Progress Notes (Signed)
Patient Demographics:    Edward [name], is a 85 y.o. male, DOB - 05/10/1935, MPN:[id number]  Admit date - 04/14/2020   Admitting Physician Jadier Rockers Denton Brick, MD  Outpatient Primary MD for the patient is [name], [name], MD  LOS - 2  Chief Complaint  Patient presents with  . Hyperglycemia        Subjective:    Edward [name] today has no fevers, no emesis,  No chest pain,    -more cooperative Less confused Oral intake is not great  Assessment  & Plan :    Principal Problem:   MRSA bacteremia Active Problems:   Acute metabolic encephalopathy   COPD (chronic obstructive pulmonary disease) ([name])   Hypertension   Pulmonary fibrosis ([name])   Hypercholesterolemia   DM type 2 causing vascular disease ([name])   Chronic diastolic CHF (congestive heart failure) ([name])   AF (paroxysmal atrial fibrillation) ([name])   Normocytic anemia   Acute kidney injury ([place])   Brief Summary:- 85 y.o. male  history significant for  PAF (atrial fibrillation),pulmonary fibrosis,COPDon home oxygen,type IIdiabetes mellitus, hyperlipidemia, hypertension, iron deficiency anemia,history of noncompliance, PSA elevation,vitamin D  admitted on 04/14/2020 with severe metabolic encephalopathy and found to have MRSA bacteremia   A/p 1)MRSA bacteremia--continue IV vancomycin started on 04/16/2020 -Echo is suboptimal quality due to poor cooperation from patient, valves are not well visualized, EF is 60 to 65% (No definite vegetations were noted on TTE but he does have calcific thickening of the mitral and aortic valves and the TTE was of relatively poor quality) --Family declined TEE, family also reluctant to have PICC line for IV vancomycin due to concerns that patient's confusional episodes may lead him to pull out lines -???  Patient may be appropriate for p.o. Zyvox instead upon discharge -Infectious disease consult/input from Dr.  Marletta Lor appreciated Microbiology results: 7/16 BCx2:NGTD 7/16 UCx: -Neg 7/15 BC x2: BCID= MRSA in 1 of 2 bottles   2) acute metabolic encephalopathy in the setting of hyperglycemia and MRSA bacteremia--continue to hydrate, continue to monitor sugar levels -UA not suggestive of UTI Leukocytosis white count down to 14.6 from 15.7   --Hold Lyrica -Use Haldol and lorazepam as needed agitation -As per family members cognitively patient is way far from baseline  2) chronic hypoxic respiratory failure--continue supplemental oxygen- -- ABG w/o hypercapnia   -Clinically and radiologically no pneumonia   3)AKI--due to dehydration and hyperglycemia, baseline creatinine is usually between 0.9 and 1, creatinine is 1.56 -Creatinine is down to 1.47  , renally adjust medications, avoid nephrotoxic agents / dehydration  / hypotension -Hold Lasix, -Hold Metformin  4)PAFIb--stable, continue metoprolol for rate control, continue Eliquis for stroke prophylaxis -Tachycardia noted as patient refused metoprolol earlier  5)COPD-no acute exacerbation at this time, continue bronchodilators,   6) uncontrolled DM--A1c 8.9, decrease Lantus insulin to 20 units from 30 units due to episodes of hypoglycemia  insulin and IV fluids as ordered -Hold Metformin due to dehydration and AKI   7)HyperNatremia--may be due to IV fluids with normal saline--free water deficit -Sodium is down to 144 from 156 -Decrease IV half-normal saline to 40 mL an hour only =-Patient not drinking enough  Disposition/Need for in-Hospital Stay- patient unable to be discharged at this time due to --- -  MRSA bacteremia requiring IV antibiotics, hypernatremia requiring IV fluids, acute metabolic encephalopathy which is still not back to baseline from him cognitive standpoint requiring further improvement prior to discharge  Dispo: The patient is from: Home  Anticipated d/c is to:   SNF  Anticipated d/c  date is: 1 day  Patient currently is not medically stable to d/c. Barriers: Not Clinically Stable- - -MRSA bacteremia requiring IV antibiotics, hypernatremia requiring IV fluids, acute metabolic encephalopathy which is not back to baseline from him cognitive standpoint requiring further improvement prior to discharge   Status is: Inpatient  Remains inpatient appropriate because:Metabolic encephalopathy requiring further investigation and IV fluids MRSA bacteremia requiring IV antibiotics, hypernatremia requiring IV fluids, acute metabolic encephalopathy which is not back to baseline from him cognitive standpoint requiring further improvement prior to discharge   Code Status : Full  Family Communication:    Discussed with patient's wife and son at bedside Consults  :  na DVT Prophylaxis  : Eliquis Lab Results  Component Value Date   PLT 283 04/16/2020    Inpatient Medications  Scheduled Meds: . apixaban  5 mg Oral BID  . cloNIDine  0.1 mg Oral Daily  . folic acid  1 mg Oral Daily  . insulin aspart  0-20 Units Subcutaneous TID WC  . insulin aspart  0-5 Units Subcutaneous QHS  . insulin aspart  3 Units Subcutaneous TID WC  . insulin glargine  20 Units Subcutaneous QHS  . metoprolol tartrate  50 mg Oral BID  . mometasone-formoterol  2 puff Inhalation BID  . pravastatin  80 mg Oral QHS  . sodium chloride flush  3 mL Intravenous Q12H  . thiamine  100 mg Oral Daily   Continuous Infusions: . sodium chloride 50 mL/hr at 04/15/20 1930  . sodium chloride    . vancomycin 750 mg (04/17/20 1443)   PRN Meds:.sodium chloride, acetaminophen **OR** acetaminophen, albuterol, dextrose, haloperidol lactate, LORazepam, metoprolol tartrate, ondansetron **OR** ondansetron (ZOFRAN) IV, polyethylene glycol, sodium chloride flush    Anti-infectives (From admission, onward)   Start     Dose/Rate Route Frequency Ordered Stop   04/17/20 1500  vancomycin (VANCOREADY) IVPB 750 mg/150 mL      Discontinue     750 mg 150 mL/hr over 60 Minutes Intravenous Every 24 hours 04/16/20 1537     04/16/20 1500  vancomycin (VANCOREADY) IVPB 1750 mg/350 mL        1,750 mg 175 mL/hr over 120 Minutes Intravenous  Once 04/16/20 1413 04/17/20 0752   04/16/20 0830  cefTRIAXone (ROCEPHIN) 2 g in sodium chloride 0.9 % 100 mL IVPB  Status:  Discontinued        2 g 200 mL/hr over 30 Minutes Intravenous Every 24 hours 04/16/20 0805 04/16/20 1447        Objective:   Vitals:   04/17/20 2016 04/18/20 0500 04/18/20 0824 04/18/20 1437  BP: 140/90 (!) 151/90  126/77  Pulse: (!) 102 95  87  Resp: 20 20  20   Temp: 98.5 F (36.9 C) 98.2 F (36.8 C)  97.7 F (36.5 C)  TempSrc: Oral Oral  Oral  SpO2: 92% 100% 94% 98%  Weight:        Wt Readings from Last 3 Encounters:  04/14/20 77.1 kg  03/24/20 77.6 kg  03/18/20 78 kg     Intake/Output Summary (Last 24 hours) at 04/18/2020 1556 Last data filed at 04/18/2020 1300 Gross per 24 hour  Intake 600 ml  Output --  Net 600  ml    Physical Exam  Gen:- Awake,  less confused  mental status -improving disorientation  nose- [place] 2L/min Eyes - sclera anicteric Neck - supple, no JVD elevation , Chest - clear  to auscultation bilaterally, symmetrical air movement,  Heart - S1 and S2 normal, regular  Abdomen - soft, nontender, nondistended,  NeuroPsych--able to follow commands,  less confused extremities - no pedal edema noted, intact peripheral pulses  Skin - warm, dry   Data Review:   Micro Results Recent Results (from the past 240 hour(s))  SARS Coronavirus 2 by RT PCR (hospital order, performed in [facility] hospital lab) Nasopharyngeal Nasopharyngeal Swab     Status: None   Collection Time: 04/14/20 10:31 AM   Specimen: Nasopharyngeal Swab  Result Value Ref Range Status   SARS Coronavirus 2 NEGATIVE NEGATIVE Final    Comment: (NOTE) SARS-CoV-2 target nucleic acids are NOT DETECTED.  The SARS-CoV-2 RNA is generally detectable in upper  and lower respiratory specimens during the acute phase of infection. The lowest concentration of SARS-CoV-2 viral copies this assay can detect is 250 copies / mL. A negative result does not preclude SARS-CoV-2 infection and should not be used as the sole basis for treatment or other patient management decisions.  A negative result may occur with improper specimen collection / handling, submission of specimen other than nasopharyngeal swab, presence of viral mutation(s) within the areas targeted by this assay, and inadequate number of viral copies (<250 copies / mL). A negative result must be combined with clinical observations, patient history, and epidemiological information.  Fact Sheet for Patients:   StrictlyIdeas.no  Fact Sheet for Healthcare Providers: BankingDealers.co.za  This test is not yet approved or  cleared by the [place] FDA and has been authorized for detection and/or diagnosis of SARS-CoV-2 by FDA under an Emergency Use Authorization (EUA).  This EUA will remain in effect (meaning this test can be used) for the duration of the COVID-19 declaration under Section 564(b)(1) of the Act, 21 U.S.C. section 360bbb-3(b)(1), unless the authorization is terminated or revoked sooner.  Performed at [facility], [address] [place], [place] [address]   Culture, blood (Routine X 2) w Reflex to ID Panel     Status: Abnormal   Collection Time: 04/15/20  8:34 AM   Specimen: BLOOD RIGHT HAND  Result Value Ref Range Status   Specimen Description   Final    BLOOD RIGHT HAND Performed at [facility], [address] [place], [place] [address]    Special Requests   Final    BOTTLES DRAWN AEROBIC AND ANAEROBIC Blood Culture adequate volume Performed at [facility], [address] [place], [place] [address]    Culture  Setup Time   Final    GRAM POSITIVE COCCI IN CLUSTERS CRITICAL RESULT CALLED TO, READ BACK BY AND VERIFIED  WITH: Rhew,J at [place] on 04/16/20 by Huffines,S. IN BOTH AEROBIC AND ANAEROBIC BOTTLES Organism ID to follow CRITICAL RESULT CALLED TO, READ BACK BY AND VERIFIED WITH: Franchot Mimes [place] [id number] MLM    Culture METHICILLIN RESISTANT STAPHYLOCOCCUS AUREUS (A)  Final   Report Status 04/18/2020 FINAL  Final   Organism ID, Bacteria METHICILLIN RESISTANT STAPHYLOCOCCUS AUREUS  Final      Susceptibility   Methicillin resistant staphylococcus aureus - MIC*    CIPROFLOXACIN >=8 RESISTANT Resistant     ERYTHROMYCIN >=8 RESISTANT Resistant     GENTAMICIN <=0.5 SENSITIVE Sensitive     OXACILLIN >=4 RESISTANT Resistant  TETRACYCLINE <=1 SENSITIVE Sensitive     VANCOMYCIN <=0.5 SENSITIVE Sensitive     TRIMETH/SULFA <=10 SENSITIVE Sensitive     CLINDAMYCIN <=0.25 SENSITIVE Sensitive     RIFAMPIN <=0.5 SENSITIVE Sensitive     Inducible Clindamycin NEGATIVE Sensitive     * METHICILLIN RESISTANT STAPHYLOCOCCUS AUREUS  Culture, blood (Routine X 2) w Reflex to ID Panel     Status: None (Preliminary result)   Collection Time: 04/15/20  8:34 AM   Specimen: Left Antecubital; Blood  Result Value Ref Range Status   Specimen Description LEFT ANTECUBITAL BLOOD  Final   Special Requests   Final    BOTTLES DRAWN AEROBIC AND ANAEROBIC Blood Culture adequate volume   Culture   Final    NO GROWTH 3 DAYS Performed at [facility], [address] [place], [place] [address]    Report Status PENDING  Incomplete  Blood Culture ID Panel (Reflexed)     Status: Abnormal   Collection Time: 04/15/20  8:34 AM  Result Value Ref Range Status   Enterococcus species NOT DETECTED NOT DETECTED Final   Listeria monocytogenes NOT DETECTED NOT DETECTED Final   Staphylococcus species DETECTED (A) NOT DETECTED Final    Comment: CRITICAL RESULT CALLED TO, READ BACK BY AND VERIFIED WITH: PHARMD S HURTH [id number] MLM    Staphylococcus aureus (BCID) DETECTED (A) NOT DETECTED Final    Comment: Methicillin (oxacillin)-resistant  Staphylococcus aureus (MRSA). MRSA is predictably resistant to beta-lactam antibiotics (except ceftaroline). Preferred therapy is vancomycin unless clinically contraindicated. Patient requires contact precautions if  hospitalized. CRITICAL RESULT CALLED TO, READ BACK BY AND VERIFIED WITH: PHARMD S HURTH [phone] MLM    Methicillin resistance DETECTED (A) NOT DETECTED Final    Comment: CRITICAL RESULT CALLED TO, READ BACK BY AND VERIFIED WITH: PHARMD S HURTH [id number] MLM    Streptococcus species NOT DETECTED NOT DETECTED Final   Streptococcus agalactiae NOT DETECTED NOT DETECTED Final   Streptococcus pneumoniae NOT DETECTED NOT DETECTED Final   Streptococcus pyogenes NOT DETECTED NOT DETECTED Final   Acinetobacter baumannii NOT DETECTED NOT DETECTED Final   Enterobacteriaceae species NOT DETECTED NOT DETECTED Final   Enterobacter cloacae complex NOT DETECTED NOT DETECTED Final   Escherichia coli NOT DETECTED NOT DETECTED Final   Klebsiella oxytoca NOT DETECTED NOT DETECTED Final   Klebsiella pneumoniae NOT DETECTED NOT DETECTED Final   Proteus species NOT DETECTED NOT DETECTED Final   Serratia marcescens NOT DETECTED NOT DETECTED Final   Haemophilus influenzae NOT DETECTED NOT DETECTED Final   Neisseria meningitidis NOT DETECTED NOT DETECTED Final   Pseudomonas aeruginosa NOT DETECTED NOT DETECTED Final   Candida albicans NOT DETECTED NOT DETECTED Final   Candida glabrata NOT DETECTED NOT DETECTED Final   Candida krusei NOT DETECTED NOT DETECTED Final   Candida parapsilosis NOT DETECTED NOT DETECTED Final   Candida tropicalis NOT DETECTED NOT DETECTED Final    Comment: Performed at [facility], [place]. [address] [place], [place] [address]  Urine Culture     Status: Abnormal   Collection Time: 04/16/20  4:21 AM   Specimen: Urine, Catheterized  Result Value Ref Range Status   Specimen Description   Final    URINE, CATHETERIZED Performed at [facility], [address] [place], [place] [address]    Special Requests   Final    Normal Performed at [facility], [address] [place], [place] [address]    Culture MULTIPLE SPECIES PRESENT, SUGGEST RECOLLECTION (A)  Final   Report Status 04/17/2020 FINAL  Final  Culture, blood (Routine X 2) w Reflex to ID Panel     Status: None (Preliminary result)   Collection Time: 04/16/20  8:48 AM   Specimen: BLOOD RIGHT ARM  Result Value Ref Range Status   Specimen Description BLOOD RIGHT ARM  Final   Special Requests   Final    BOTTLES DRAWN AEROBIC AND ANAEROBIC Blood Culture adequate volume   Culture   Final    NO GROWTH 2 DAYS Performed at [facility], [address] [place], [place] [address]    Report Status PENDING  Incomplete  Culture, blood (Routine X 2) w Reflex to ID Panel     Status: None (Preliminary result)   Collection Time: 04/16/20  8:56 AM   Specimen: BLOOD RIGHT HAND  Result Value Ref Range Status   Specimen Description BLOOD RIGHT HAND  Final   Special Requests   Final    BOTTLES DRAWN AEROBIC AND ANAEROBIC Blood Culture adequate volume   Culture   Final    NO GROWTH 2 DAYS Performed at [facility], [address] [place], [place] [address]    Report Status PENDING  Incomplete    Radiology Reports CT Head Wo Contrast  Result Date: 03/23/2020 CLINICAL DATA:  85 year old male with neurologic deficit. EXAM: CT HEAD WITHOUT CONTRAST TECHNIQUE: Contiguous axial images were obtained from the base of the skull through the vertex without intravenous contrast. COMPARISON:  Head CT dated 01/19/2020. FINDINGS: Brain: Mild age-related atrophy and chronic microvascular ischemic changes. There is no acute intracranial hemorrhage. No mass effect or midline shift. No extra-axial fluid collection. Vascular: No hyperdense vessel or unexpected calcification. Skull: Normal. Negative for fracture or focal lesion. Sinuses/Orbits: Mild mucoperiosteal thickening of paranasal sinuses. No air-fluid level. The  mastoid air cells are clear. Other: None IMPRESSION: 1. No acute intracranial pathology. 2. Mild age-related atrophy and chronic microvascular ischemic changes. Electronically Signed   By: Anner Crete M.D.   On: 03/23/2020 19:31   DG Chest Port 1 View  Result Date: 04/14/2020 CLINICAL DATA:  Shortness of breath EXAM: PORTABLE CHEST 1 VIEW COMPARISON:  03/23/2020 FINDINGS: Mild interstitial prominence throughout the lungs, likely chronic. Heart and mediastinal contours are within normal limits. No focal opacities or effusions. No acute bony abnormality. IMPRESSION: No active disease. Electronically Signed   By: Rolm Baptise M.D.   On: 04/14/2020 09:57   DG Chest Port 1 View  Result Date: 03/23/2020 CLINICAL DATA:  85 year old male with wheezing. EXAM: PORTABLE CHEST 1 VIEW COMPARISON:  Chest radiograph dated 01/19/2020. FINDINGS: Mild diffuse interstitial prominence and nodularity, chronic. No new consolidative changes. There is no pleural effusion pneumothorax. Stable cardiac silhouette. Atherosclerotic calcification of the aorta. No acute osseous pathology. IMPRESSION: No interval change.  No new consolidation. Electronically Signed   By: Anner Crete M.D.   On: 03/23/2020 18:49   ECHOCARDIOGRAM COMPLETE  Result Date: 04/17/2020    ECHOCARDIOGRAM REPORT   Patient Name:   Edward [name] Date of Exam: 04/17/2020 Medical Rec #:  [id number]      Height:       65.0 in Accession #:    [id number]     Weight:       170.0 lb Date of Birth:  09-15-1935      BSA:          1.846 m Patient Age:    68 years       BP:  151/80 mmHg Patient Gender: M              HR:           97 bpm. Exam Location:  Inpatient Procedure: 2D Echo Indications:    790.7 bacteremia  History:        Patient has prior history of Echocardiogram examinations, most                 recent 09/23/2017. COPD, Arrythmias:Atrial Fibrillation; Risk                 Factors:Diabetes, Hypertension, Dyslipidemia and Former Smoker.                  Chronic respiratory failure.  Sonographer:    Jannett Celestine RDCS (AE) Referring Phys: [id number] [facility]  Sonographer Comments: Image acquisition challenging due to uncooperative patient and Image acquisition challenging due to respiratory motion. restricted mobility. patient had difficulty remaining still & was coughing often. unable to follow breathing instructions properly IMPRESSIONS  1. Left ventricular ejection fraction, by estimation, is 65 to 70%. The left ventricle has normal function. Left ventricular endocardial border not optimally defined to evaluate regional wall motion. Left ventricular diastolic parameters are indeterminate.  2. Right ventricular systolic function is normal. The right ventricular size is normal.  3. The mitral valve is abnormal. Trivial mitral valve regurgitation.  4. AV is thickened, calcified There appears on 2 D to have restricted motion. Images are difficult/poor. No gradient through the valve. Compared report form december 2018, AV appeared mildly narrowed at that time     Would recomm limited echo in future to complete assessment of valve when patient can be brought down to echo suite for nonportable exam.. The aortic valve was not well visualized. Aortic valve regurgitation is not visualized.  5. The inferior vena cava is normal in size with greater than 50% respiratory variability, suggesting right atrial pressure of 3 mmHg. FINDINGS  Left Ventricle: Left ventricular ejection fraction, by estimation, is 65 to 70%. The left ventricle has normal function. Left ventricular endocardial border not optimally defined to evaluate regional wall motion. The left ventricular internal cavity size was small. There is no left ventricular hypertrophy. Left ventricular diastolic parameters are indeterminate. Right Ventricle: The right ventricular size is normal. Right vetricular wall thickness was not assessed. Right ventricular systolic function is normal. Left Atrium: Left atrial  size was normal in size. Right Atrium: Right atrial size was normal in size. Pericardium: There is no evidence of pericardial effusion. Mitral Valve: The mitral valve is abnormal. There is mild thickening of the mitral valve leaflet(s). Mild mitral annular calcification. Trivial mitral valve regurgitation. Tricuspid Valve: The tricuspid valve is grossly normal. Tricuspid valve regurgitation is trivial. Aortic Valve: AV is thickened, calcified There appears on 2 D to have restricted motion. Images are difficult/poor. No gradient through the valve. Compared report form december 2018, AV appeared mildly narrowed at that time Would recomm limited echo in future to complete assessment of valve when patient can be brought down to echo suite for nonportable exam. The aortic valve was not well visualized. Aortic valve regurgitation is not visualized. Pulmonic Valve: The pulmonic valve was not well visualized. Pulmonic valve regurgitation is not visualized. Aorta: The aortic root is normal in size and structure. Venous: The inferior vena cava is normal in size with greater than 50% respiratory variability, suggesting right atrial pressure of 3 mmHg. IAS/Shunts: The interatrial septum was not assessed.  LEFT VENTRICLE PLAX  2D LVIDd:         3.07 cm  Diastology LVIDs:         2.13 cm  LV e' lateral:   9.79 cm/s LV PW:         1.12 cm  LV E/e' lateral: 6.1 LV IVS:        1.19 cm  LV e' medial:    4.24 cm/s LVOT diam:     2.00 cm  LV E/e' medial:  14.2 LV SV:         35 LV SV Index:   19 LVOT Area:     3.14 cm  LEFT ATRIUM           Index       RIGHT ATRIUM           Index LA diam:      3.40 cm 1.84 cm/m  RA Area:     10.40 cm LA Vol (A4C): 24.5 ml 13.27 ml/m RA Volume:   18.50 ml  10.02 ml/m  AORTIC VALVE LVOT Vmax:   74.40 cm/s LVOT Vmean:  57.100 cm/s LVOT VTI:    0.111 m  AORTA Ao Root diam: 3.30 cm MITRAL VALVE MV Area (PHT): 2.63 cm    SHUNTS MV Decel Time: 288 msec    Systemic VTI:  0.11 m MV E velocity: 60.00 cm/s   Systemic Diam: 2.00 cm MV A velocity: 71.10 cm/s MV E/A ratio:  0.84 MV A Prime:    10.0 cm/s Dorris Carnes MD Electronically signed by Dorris Carnes MD Signature Date/Time: 04/17/2020/10:28:46 AM    Final      CBC Recent Labs  Lab 04/14/20 0935 04/14/20 1146 04/15/20 0425 04/16/20 0703  WBC 12.7* 14.6* 15.7* 14.6*  HGB 12.7* 11.9* 11.2* 11.7*  HCT 40.1 38.0* 35.1* 39.1  PLT 253 247 263 283  MCV 93.0 94.1 93.9 98.2  MCH 29.5 29.5 29.9 29.4  MCHC 31.7 31.3 31.9 29.9*  RDW 13.9 13.9 14.0 14.1  LYMPHSABS 1.3 1.3  --   --   MONOABS 0.9 1.1*  --   --   EOSABS 0.0 0.0  --   --   BASOSABS 0.0 0.0  --   --     Chemistries  Recent Labs  Lab 04/14/20 0935 04/14/20 1146 04/15/20 0010 04/15/20 0425 04/15/20 0834 04/16/20 0703 04/18/20 0615  NA 144   < > 150* 149* 150* 156* 144  K 4.2   < > 3.3* 3.3* 3.4* 3.2* 3.1*  CL 97*   < > 109 109 109 110 109  CO2 29   < > 29 28 27 27 25   GLUCOSE 511*   < > 95 175* 286* 119* 152*  BUN 47*   < > 41* 40* 42* 42* 46*  CREATININE 1.49*   < > 1.22 1.28* 1.30* 1.47* 1.40*  CALCIUM 9.6   < > 9.0 9.0 9.2 9.8 8.5*  MG 2.9*  --   --   --   --   --  2.4  AST 11*  --   --   --   --  20  --   ALT 14  --   --   --   --  13  --   ALKPHOS 90  --   --   --   --  76  --   BILITOT 0.8  --   --   --   --  0.5  --    < > =  values in this interval not displayed.   ------------------------------------------------------------------------------------------------------------------ No results for input(s): CHOL, HDL, LDLCALC, TRIG, CHOLHDL, LDLDIRECT in the last 72 hours.  Lab Results  Component Value Date   HGBA1C 8.9 (H) 04/14/2020   ------------------------------------------------------------------------------------------------------------------ No results for input(s): TSH, T4TOTAL, T3FREE, THYROIDAB in the last 72 hours.  Invalid input(s):  FREET3 ------------------------------------------------------------------------------------------------------------------ No results for input(s): VITAMINB12, FOLATE, FERRITIN, TIBC, IRON, RETICCTPCT in the last 72 hours.  Coagulation profile No results for input(s): INR, PROTIME in the last 168 hours.  No results for input(s): DDIMER in the last 72 hours.  Cardiac Enzymes No results for input(s): CKMB, TROPONINI, MYOGLOBIN in the last 168 hours.  Invalid input(s): CK ------------------------------------------------------------------------------------------------------------------    Component Value Date/Time   BNP 298.0 (H) 03/23/2020 1705   BNP 101 (H) 08/19/2018 1015     Roxan Hockey M.D on 04/18/2020 at 3:56 PM  Go to [email or web address] - for contact info  [facility] - Office  [phone]

## 2020-04-18 NOTE — Progress Notes (Signed)
Patient refused mdi

## 2020-04-18 NOTE — Progress Notes (Signed)
Patient CBG 64. 4 ozs of juice offered to patient. Will recheck CBG in approximately 15 minutes.

## 2020-04-18 NOTE — Progress Notes (Signed)
-  Please see progress note from earlier today  As per infectious diseases specialist Dr. Michel Bickers upon discharge may be okay to discharge on p.o. Zyvox for total of 4 weeks from first negative culture  Roxan Hockey, MD

## 2020-04-19 ENCOUNTER — Other Ambulatory Visit: Payer: Self-pay | Admitting: *Deleted

## 2020-04-19 LAB — GLUCOSE, CAPILLARY
Glucose-Capillary: 548 mg/dL (ref 70–99)
Glucose-Capillary: 93 mg/dL (ref 70–99)
Glucose-Capillary: 186 mg/dL — ABNORMAL HIGH (ref 70–99)
Glucose-Capillary: 157 mg/dL — ABNORMAL HIGH (ref 70–99)

## 2020-04-19 LAB — CBC
HCT: 35.2 % — ABNORMAL LOW (ref 39.0–52.0)
Hemoglobin: 11 g/dL — ABNORMAL LOW (ref 13.0–17.0)
MCH: 29.1 pg (ref 26.0–34.0)
MCHC: 31.3 g/dL (ref 30.0–36.0)
MCV: 93.1 fL (ref 80.0–100.0)
Platelets: 317 10*3/uL (ref 150–400)
RBC: 3.78 MIL/uL — ABNORMAL LOW (ref 4.22–5.81)
RDW: 13.9 % (ref 11.5–15.5)
WBC: 6.9 10*3/uL (ref 4.0–10.5)
nRBC: 0 % (ref 0.0–0.2)

## 2020-04-19 LAB — BASIC METABOLIC PANEL
Anion gap: 10 (ref 5–15)
BUN: 38 mg/dL — ABNORMAL HIGH (ref 8–23)
CO2: 23 mmol/L (ref 22–32)
Calcium: 8.6 mg/dL — ABNORMAL LOW (ref 8.9–10.3)
Chloride: 110 mmol/L (ref 98–111)
Creatinine, Ser: 1.19 mg/dL (ref 0.61–1.24)
GFR calc Af Amer: >60 mL/min (ref >60)
GFR calc non Af Amer: 57 mL/min — ABNORMAL LOW (ref >60)
Glucose, Bld: 91 mg/dL (ref 70–99)
Potassium: 3.7 mmol/L (ref 3.5–5.1)
Sodium: 143 mmol/L (ref 135–145)

## 2020-04-19 MED ORDER — LINEZOLID 600 MG PO TABS
600.0000 mg | ORAL_TABLET | Freq: Two times a day (BID) | ORAL | 0 refills | Status: DC
Start: 2020-04-19 — End: 2021-07-15

## 2020-04-19 MED ORDER — POTASSIUM CHLORIDE CRYS ER 20 MEQ PO TBCR: 20.0000 meq | EXTENDED_RELEASE_TABLET | Freq: Every day | ORAL | 1 refills | Status: DC

## 2020-04-19 MED ORDER — BASAGLAR KWIKPEN 100 UNIT/ML ~~LOC~~ SOPN: 24.0000 [IU] | PEN_INJECTOR | Freq: Every day | SUBCUTANEOUS | 3 refills | Status: DC

## 2020-04-19 MED ORDER — METOPROLOL TARTRATE 50 MG PO TABS: 50.0000 mg | ORAL_TABLET | Freq: Two times a day (BID) | ORAL | 2 refills | Status: DC

## 2020-04-19 MED ORDER — FUROSEMIDE 20 MG PO TABS: 20.0000 mg | ORAL_TABLET | Freq: Every day | ORAL | 1 refills | Status: DC

## 2020-04-19 MED ORDER — LINEZOLID 600 MG PO TABS
600.0000 mg | ORAL_TABLET | Freq: Two times a day (BID) | ORAL | 0 refills | Status: DC
Start: 2020-04-19 — End: 2020-04-19

## 2020-04-19 NOTE — Patient Outreach (Signed)
[facility]) Care Management  04/19/2020  Edward [name] Dec 05, 1934 [id number]   Telephone Assessment-Hospitalized  RN spoke with pt today who indicated he remains in the hospital. RN will follow up with pt once he returns home for ongoing case management services.  Raina Mina, RN Care Management Coordinator [place] Office [phone]

## 2020-04-19 NOTE — Progress Notes (Signed)
Patient Demographics:    Edward [name], is a 85 y.o. male, DOB - 01/02/1935, OTL:[id number]  Admit date - 04/14/2020   Admitting Physician Laker Thompson Denton Brick, MD  Outpatient Primary MD for the patient is [name], [name], MD  LOS - 3  Chief Complaint  Patient presents with  . Hyperglycemia        Subjective:    [name] today has no fevers, no emesis,  No chest pain,    Ambulated with Phy therapy-- --fatigue and some confusion persist ---Anticipate discharge home on 04/20/20 --  Assessment  & Plan :    Principal Problem:   MRSA bacteremia Active Problems:   Acute metabolic encephalopathy   COPD (chronic obstructive pulmonary disease) ([name])   Hypertension   Pulmonary fibrosis ([name])   Hypercholesterolemia   DM type 2 causing vascular disease ([name])   Chronic diastolic CHF (congestive heart failure) ([name])   AF (paroxysmal atrial fibrillation) ([name])   Normocytic anemia   Acute kidney injury ([place])   Brief Summary:- 85 y.o. male  history significant for  PAF (atrial fibrillation),pulmonary fibrosis,COPDon home oxygen,type IIdiabetes mellitus, hyperlipidemia, hypertension, iron deficiency anemia,history of noncompliance, PSA elevation,vitamin D  admitted on 04/14/2020 with severe metabolic encephalopathy and found to have MRSA bacteremia  ---Anticipate discharge home on 04/20/20 --  A/p 1)MRSA bacteremia--continue IV vancomycin started on 04/16/2020 -Echo is suboptimal quality due to poor cooperation from patient, valves are not well visualized, EF is 60 to 65% (No definite vegetations were noted on TTE but he does have calcific thickening of the mitral and aortic valves and the TTE was of relatively poor quality) --Family declined TEE, family also reluctant to have PICC line for IV vancomycin due to concerns that patient's confusional episodes may lead him to pull out lines -anticipate dc  home on p.o. Zyvox -Infectious disease consult/input from Dr. Michel Bickers appreciated WBC 6.9 Microbiology results: 7/16 BCx2:NGTD 7/16 UCx: -Neg 7/15 BC x2: BCID= MRSA in 1 of 2 bottles  -- Antimicrobials this admission: vancomycin 7/16>> ceftriaxone 7/16>>7/16   2) acute metabolic encephalopathy in the setting of hyperglycemia and MRSA bacteremia--continue to hydrate, continue to monitor sugar levels -UA not suggestive of UTI Leukocytosis white count down to 6.9 from 15.7   --Hold Lyrica -Use Haldol and lorazepam as needed agitation -As per family members cognitively patient is getting closer to baseline, but not quite back to baseline yet  2) chronic hypoxic respiratory failure--continue supplemental oxygen- -- ABG w/o hypercapnia   -Clinically and radiologically no pneumonia  3)AKI--due to dehydration and hyperglycemia, baseline creatinine is usually between 0.9 and 1, creatinine is 1.56 -Creatinine is down to 1.19  , renally adjust medications, avoid nephrotoxic agents / dehydration  / hypotension -Hold Lasix, -Hold Metformin  4)PAFIb--stable, continue metoprolol for rate control, continue Eliquis for stroke prophylaxis  5)COPD-no acute exacerbation at this time, continue bronchodilators,   6) uncontrolled DM--A1c 8.9, c/n  Lantus insulin to 20 units from 30 units due to episodes of hypoglycemia  insulin and IV fluids as ordered -Hold Metformin due to dehydration and AKI   7)HyperNatremia--may be due to IV fluids with normal saline--free water deficit -Sodium is down to 143 from 156 -c/n IV half-normal saline to 40 mL an hour only =-Patient was  not drinking enough  Disposition/Need for in-Hospital Stay- patient unable to be discharged at this time due to --- -MRSA bacteremia requiring IV Vancomycin, hypernatremia requiring IV fluids, acute metabolic encephalopathy which is still not back to baseline from him cognitive standpoint requiring further improvement  prior to discharge ---Anticipate discharge home on 04/20/20 --  Dispo: The patient is from: Home  Anticipated d/c is to:   SNF  Anticipated d/c date is: 1 day  Patient currently is not medically stable to d/c. Barriers: Not Clinically Stable- - -MRSA bacteremia requiring IV Vancomycin, hypernatremia requiring IV fluids, acute metabolic encephalopathy which is not back to baseline from him cognitive standpoint requiring further improvement prior to discharge ---Anticipate discharge home on 04/20/20 --  Status is: Inpatient  Remains inpatient appropriate because:Metabolic encephalopathy requiring further investigation and IV fluids MRSA bacteremia requiring IV Vancomycin, hypernatremia requiring IV fluids, acute metabolic encephalopathy which is not back to baseline from him cognitive standpoint requiring further improvement prior to discharge ---Anticipate discharge home on 04/20/20 --  Code Status : Full  Family Communication:    Discussed with patient's wife and son at bedside Consults  :  na DVT Prophylaxis  : Eliquis Lab Results  Component Value Date   PLT 317 04/19/2020    Inpatient Medications  Scheduled Meds: . apixaban  5 mg Oral BID  . cloNIDine  0.1 mg Oral Daily  . folic acid  1 mg Oral Daily  . insulin aspart  0-20 Units Subcutaneous TID WC  . insulin aspart  0-5 Units Subcutaneous QHS  . insulin aspart  3 Units Subcutaneous TID WC  . insulin glargine  20 Units Subcutaneous QHS  . metoprolol tartrate  50 mg Oral BID  . mometasone-formoterol  2 puff Inhalation BID  . pravastatin  80 mg Oral QHS  . sodium chloride flush  3 mL Intravenous Q12H  . thiamine  100 mg Oral Daily   Continuous Infusions: . sodium chloride 50 mL/hr at 04/19/20 0411  . sodium chloride    . vancomycin Stopped (04/18/20 1844)   PRN Meds:.sodium chloride, acetaminophen **OR** acetaminophen, albuterol, dextrose, haloperidol lactate, LORazepam, metoprolol  tartrate, ondansetron **OR** ondansetron (ZOFRAN) IV, polyethylene glycol, sodium chloride flush   Anti-infectives (From admission, onward)   Start     Dose/Rate Route Frequency Ordered Stop   04/17/20 1500  vancomycin (VANCOREADY) IVPB 750 mg/150 mL     Discontinue     750 mg 150 mL/hr over 60 Minutes Intravenous Every 24 hours 04/16/20 1537     04/16/20 1500  vancomycin (VANCOREADY) IVPB 1750 mg/350 mL        1,750 mg 175 mL/hr over 120 Minutes Intravenous  Once 04/16/20 1413 04/17/20 0752   04/16/20 0830  cefTRIAXone (ROCEPHIN) 2 g in sodium chloride 0.9 % 100 mL IVPB  Status:  Discontinued        2 g 200 mL/hr over 30 Minutes Intravenous Every 24 hours 04/16/20 0805 04/16/20 1447        Objective:   Vitals:   04/18/20 1932 04/18/20 2109 04/19/20 0523 04/19/20 1300  BP:  120/70 128/79 122/72  Pulse: 89 88 73 81  Resp: 20 20 20 20   Temp:  99.1 F (37.3 C) 98.3 F (36.8 C) 98.5 F (36.9 C)  TempSrc:  Oral Oral Oral  SpO2: 98% 99% 97% 97%  Weight:   75.1 kg     Wt Readings from Last 3 Encounters:  04/19/20 75.1 kg  03/24/20 77.6 kg  03/18/20 78 kg  Intake/Output Summary (Last 24 hours) at 04/19/2020 1615 Last data filed at 04/19/2020 1300 Gross per 24 hour  Intake 2551.92 ml  Output 350 ml  Net 2201.92 ml   Physical Exam  Gen:- Awake,  less confused  mental status -improving disorientation  nose- [place] 2L/min Eyes - sclera anicteric Neck - supple, no JVD elevation , Chest - clear  to auscultation bilaterally, symmetrical air movement,  Heart - S1 and S2 normal, regular  Abdomen - soft, nontender, nondistended,  NeuroPsych--able to follow commands,  less confused extremities - no pedal edema noted, intact peripheral pulses  Skin - warm, dry   Data Review:   Micro Results Recent Results (from the past 240 hour(s))  SARS Coronavirus 2 by RT PCR (hospital order, performed in [facility]) Nasopharyngeal Nasopharyngeal Swab     Status: None    Collection Time: 04/14/20 10:31 AM   Specimen: Nasopharyngeal Swab  Result Value Ref Range Status   SARS Coronavirus 2 NEGATIVE NEGATIVE Final    Comment: (NOTE) SARS-CoV-2 target nucleic acids are NOT DETECTED.  The SARS-CoV-2 RNA is generally detectable in upper and lower respiratory specimens during the acute phase of infection. The lowest concentration of SARS-CoV-2 viral copies this assay can detect is 250 copies / mL. A negative result does not preclude SARS-CoV-2 infection and should not be used as the sole basis for treatment or other patient management decisions.  A negative result may occur with improper specimen collection / handling, submission of specimen other than nasopharyngeal swab, presence of viral mutation(s) within the areas targeted by this assay, and inadequate number of viral copies (<250 copies / mL). A negative result must be combined with clinical observations, patient history, and epidemiological information.  Fact Sheet for Patients:   StrictlyIdeas.no  Fact Sheet for Healthcare Providers: BankingDealers.co.za  This test is not yet approved or  cleared by the [place] FDA and has been authorized for detection and/or diagnosis of SARS-CoV-2 by FDA under an Emergency Use Authorization (EUA).  This EUA will remain in effect (meaning this test can be used) for the duration of the COVID-19 declaration under Section 564(b)(1) of the Act, 21 U.S.C. section 360bbb-3(b)(1), unless the authorization is terminated or revoked sooner.  Performed at [facility], [address] [place], [place] [address]   Culture, blood (Routine X 2) w Reflex to ID Panel     Status: Abnormal   Collection Time: 04/15/20  8:34 AM   Specimen: BLOOD RIGHT HAND  Result Value Ref Range Status   Specimen Description   Final    BLOOD RIGHT HAND Performed at [facility], [address] [place], [place] [address]    Special Requests    Final    BOTTLES DRAWN AEROBIC AND ANAEROBIC Blood Culture adequate volume Performed at [facility], [address] [place] [address], [place], [place] [address]    Culture  Setup Time   Final    GRAM POSITIVE COCCI IN CLUSTERS CRITICAL RESULT CALLED TO, READ BACK BY AND VERIFIED WITH: Rhew,J at [place] on 04/16/20 by Huffines,S. IN BOTH AEROBIC AND ANAEROBIC BOTTLES Organism ID to follow CRITICAL RESULT CALLED TO, READ BACK BY AND VERIFIED WITH: PHARMD S HURTH [id number] MLM    Culture METHICILLIN RESISTANT STAPHYLOCOCCUS AUREUS (A)  Final   Report Status 04/18/2020 FINAL  Final   Organism ID, Bacteria METHICILLIN RESISTANT STAPHYLOCOCCUS AUREUS  Final      Susceptibility   Methicillin resistant staphylococcus aureus - MIC*    CIPROFLOXACIN >=8 RESISTANT  Resistant     ERYTHROMYCIN >=8 RESISTANT Resistant     GENTAMICIN <=0.5 SENSITIVE Sensitive     OXACILLIN >=4 RESISTANT Resistant     TETRACYCLINE <=1 SENSITIVE Sensitive     VANCOMYCIN <=0.5 SENSITIVE Sensitive     TRIMETH/SULFA <=10 SENSITIVE Sensitive     CLINDAMYCIN <=0.25 SENSITIVE Sensitive     RIFAMPIN <=0.5 SENSITIVE Sensitive     Inducible Clindamycin NEGATIVE Sensitive     * METHICILLIN RESISTANT STAPHYLOCOCCUS AUREUS  Culture, blood (Routine X 2) w Reflex to ID Panel     Status: None (Preliminary result)   Collection Time: 04/15/20  8:34 AM   Specimen: Left Antecubital; Blood  Result Value Ref Range Status   Specimen Description LEFT ANTECUBITAL BLOOD  Final   Special Requests   Final    BOTTLES DRAWN AEROBIC AND ANAEROBIC Blood Culture adequate volume   Culture   Final    NO GROWTH 4 DAYS Performed at [facility], [address], [place], [place] [address]    Report Status PENDING  Incomplete  Blood Culture ID Panel (Reflexed)     Status: Abnormal   Collection Time: 04/15/20  8:34 AM  Result Value Ref Range Status   Enterococcus species NOT DETECTED NOT DETECTED Final   Listeria monocytogenes NOT DETECTED NOT DETECTED Final    Staphylococcus species DETECTED (A) NOT DETECTED Final    Comment: CRITICAL RESULT CALLED TO, READ BACK BY AND VERIFIED WITH: PHARMD S HURTH [id number] 1406 MLM    Staphylococcus aureus (BCID) DETECTED (A) NOT DETECTED Final    Comment: Methicillin (oxacillin)-resistant Staphylococcus aureus (MRSA). MRSA is predictably resistant to beta-lactam antibiotics (except ceftaroline). Preferred therapy is vancomycin unless clinically contraindicated. Patient requires contact precautions if  hospitalized. CRITICAL RESULT CALLED TO, READ BACK BY AND VERIFIED WITH: PHARMD S HURTH [id number] MLM    Methicillin resistance DETECTED (A) NOT DETECTED Final    Comment: CRITICAL RESULT CALLED TO, READ BACK BY AND VERIFIED WITH: PHARMD S HURTH [id number] 1406 MLM    Streptococcus species NOT DETECTED NOT DETECTED Final   Streptococcus agalactiae NOT DETECTED NOT DETECTED Final   Streptococcus pneumoniae NOT DETECTED NOT DETECTED Final   Streptococcus pyogenes NOT DETECTED NOT DETECTED Final   Acinetobacter baumannii NOT DETECTED NOT DETECTED Final   Enterobacteriaceae species NOT DETECTED NOT DETECTED Final   Enterobacter cloacae complex NOT DETECTED NOT DETECTED Final   Escherichia coli NOT DETECTED NOT DETECTED Final   Klebsiella oxytoca NOT DETECTED NOT DETECTED Final   Klebsiella pneumoniae NOT DETECTED NOT DETECTED Final   Proteus species NOT DETECTED NOT DETECTED Final   Serratia marcescens NOT DETECTED NOT DETECTED Final   Haemophilus influenzae NOT DETECTED NOT DETECTED Final   Neisseria meningitidis NOT DETECTED NOT DETECTED Final   Pseudomonas aeruginosa NOT DETECTED NOT DETECTED Final   Candida albicans NOT DETECTED NOT DETECTED Final   Candida glabrata NOT DETECTED NOT DETECTED Final   Candida krusei NOT DETECTED NOT DETECTED Final   Candida parapsilosis NOT DETECTED NOT DETECTED Final   Candida tropicalis NOT DETECTED NOT DETECTED Final    Comment: Performed at [facility], [place].  [address], [place], [place] [address]  Urine Culture     Status: Abnormal   Collection Time: 04/16/20  4:21 AM   Specimen: Urine, Catheterized  Result Value Ref Range Status   Specimen Description   Final    URINE, CATHETERIZED Performed at [facility], [address] [place], [place] [address]    Special Requests  Final    Normal Performed at [facility], [address], [place], [place] [address]    Culture MULTIPLE SPECIES PRESENT, SUGGEST RECOLLECTION (A)  Final   Report Status 04/17/2020 FINAL  Final  Culture, blood (Routine X 2) w Reflex to ID Panel     Status: None (Preliminary result)   Collection Time: 04/16/20  8:48 AM   Specimen: BLOOD RIGHT ARM  Result Value Ref Range Status   Specimen Description BLOOD RIGHT ARM  Final   Special Requests   Final    BOTTLES DRAWN AEROBIC AND ANAEROBIC Blood Culture adequate volume   Culture   Final    NO GROWTH 3 DAYS Performed at [facility], [address] [place], [place] [address]    Report Status PENDING  Incomplete  Culture, blood (Routine X 2) w Reflex to ID Panel     Status: None (Preliminary result)   Collection Time: 04/16/20  8:56 AM   Specimen: BLOOD RIGHT HAND  Result Value Ref Range Status   Specimen Description BLOOD RIGHT HAND  Final   Special Requests   Final    BOTTLES DRAWN AEROBIC AND ANAEROBIC Blood Culture adequate volume   Culture   Final    NO GROWTH 3 DAYS Performed at [facility], [address] [place], [place] [address]    Report Status PENDING  Incomplete    Radiology Reports CT Head Wo Contrast  Result Date: 03/23/2020 CLINICAL DATA:  85 year old male with neurologic deficit. EXAM: CT HEAD WITHOUT CONTRAST TECHNIQUE: Contiguous axial images were obtained from the base of the skull through the vertex without intravenous contrast. COMPARISON:  Head CT dated 01/19/2020. FINDINGS: Brain: Mild age-related atrophy and chronic microvascular ischemic changes. There is no acute intracranial hemorrhage. No mass  effect or midline shift. No extra-axial fluid collection. Vascular: No hyperdense vessel or unexpected calcification. Skull: Normal. Negative for fracture or focal lesion. Sinuses/Orbits: Mild mucoperiosteal thickening of paranasal sinuses. No air-fluid level. The mastoid air cells are clear. Other: None IMPRESSION: 1. No acute intracranial pathology. 2. Mild age-related atrophy and chronic microvascular ischemic changes. Electronically Signed   By: Anner Crete M.D.   On: 03/23/2020 19:31   DG Chest Port 1 View  Result Date: 04/14/2020 CLINICAL DATA:  Shortness of breath EXAM: PORTABLE CHEST 1 VIEW COMPARISON:  03/23/2020 FINDINGS: Mild interstitial prominence throughout the lungs, likely chronic. Heart and mediastinal contours are within normal limits. No focal opacities or effusions. No acute bony abnormality. IMPRESSION: No active disease. Electronically Signed   By: Rolm Baptise M.D.   On: 04/14/2020 09:57   DG Chest Port 1 View  Result Date: 03/23/2020 CLINICAL DATA:  85 year old male with wheezing. EXAM: PORTABLE CHEST 1 VIEW COMPARISON:  Chest radiograph dated 01/19/2020. FINDINGS: Mild diffuse interstitial prominence and nodularity, chronic. No new consolidative changes. There is no pleural effusion pneumothorax. Stable cardiac silhouette. Atherosclerotic calcification of the aorta. No acute osseous pathology. IMPRESSION: No interval change.  No new consolidation. Electronically Signed   By: Anner Crete M.D.   On: 03/23/2020 18:49   ECHOCARDIOGRAM COMPLETE  Result Date: 04/17/2020    ECHOCARDIOGRAM REPORT   Patient Name:   ROCHESTER SERPE Irigoyen Date of Exam: 04/17/2020 Medical Rec #:  [id number]      Height:       65.0 in Accession #:    [id number]     Weight:       170.0 lb Date of Birth:  Aug 28, 1935      BSA:  1.846 m Patient Age:    68 years       BP:           151/80 mmHg Patient Gender: M              HR:           97 bpm. Exam Location:  Inpatient Procedure: 2D Echo Indications:     790.7 bacteremia  History:        Patient has prior history of Echocardiogram examinations, most                 recent 09/23/2017. COPD, Arrythmias:Atrial Fibrillation; Risk                 Factors:Diabetes, Hypertension, Dyslipidemia and Former Smoker.                 Chronic respiratory failure.  Sonographer:    Jannett Celestine RDCS (AE) Referring Phys: [id number] [facility]  Sonographer Comments: Image acquisition challenging due to uncooperative patient and Image acquisition challenging due to respiratory motion. restricted mobility. patient had difficulty remaining still & was coughing often. unable to follow breathing instructions properly IMPRESSIONS  1. Left ventricular ejection fraction, by estimation, is 65 to 70%. The left ventricle has normal function. Left ventricular endocardial border not optimally defined to evaluate regional wall motion. Left ventricular diastolic parameters are indeterminate.  2. Right ventricular systolic function is normal. The right ventricular size is normal.  3. The mitral valve is abnormal. Trivial mitral valve regurgitation.  4. AV is thickened, calcified There appears on 2 D to have restricted motion. Images are difficult/poor. No gradient through the valve. Compared report form december 2018, AV appeared mildly narrowed at that time     Would recomm limited echo in future to complete assessment of valve when patient can be brought down to echo suite for nonportable exam.. The aortic valve was not well visualized. Aortic valve regurgitation is not visualized.  5. The inferior vena cava is normal in size with greater than 50% respiratory variability, suggesting right atrial pressure of 3 mmHg. FINDINGS  Left Ventricle: Left ventricular ejection fraction, by estimation, is 65 to 70%. The left ventricle has normal function. Left ventricular endocardial border not optimally defined to evaluate regional wall motion. The left ventricular internal cavity size was small. There is  no left ventricular hypertrophy. Left ventricular diastolic parameters are indeterminate. Right Ventricle: The right ventricular size is normal. Right vetricular wall thickness was not assessed. Right ventricular systolic function is normal. Left Atrium: Left atrial size was normal in size. Right Atrium: Right atrial size was normal in size. Pericardium: There is no evidence of pericardial effusion. Mitral Valve: The mitral valve is abnormal. There is mild thickening of the mitral valve leaflet(s). Mild mitral annular calcification. Trivial mitral valve regurgitation. Tricuspid Valve: The tricuspid valve is grossly normal. Tricuspid valve regurgitation is trivial. Aortic Valve: AV is thickened, calcified There appears on 2 D to have restricted motion. Images are difficult/poor. No gradient through the valve. Compared report form december 2018, AV appeared mildly narrowed at that time Would recomm limited echo in future to complete assessment of valve when patient can be brought down to echo suite for nonportable exam. The aortic valve was not well visualized. Aortic valve regurgitation is not visualized. Pulmonic Valve: The pulmonic valve was not well visualized. Pulmonic valve regurgitation is not visualized. Aorta: The aortic root is normal in size and structure. Venous: The inferior vena cava is normal  in size with greater than 50% respiratory variability, suggesting right atrial pressure of 3 mmHg. IAS/Shunts: The interatrial septum was not assessed.  LEFT VENTRICLE PLAX 2D LVIDd:         3.07 cm  Diastology LVIDs:         2.13 cm  LV e' lateral:   9.79 cm/s LV PW:         1.12 cm  LV E/e' lateral: 6.1 LV IVS:        1.19 cm  LV e' medial:    4.24 cm/s LVOT diam:     2.00 cm  LV E/e' medial:  14.2 LV SV:         35 LV SV Index:   19 LVOT Area:     3.14 cm  LEFT ATRIUM           Index       RIGHT ATRIUM           Index LA diam:      3.40 cm 1.84 cm/m  RA Area:     10.40 cm LA Vol (A4C): 24.5 ml 13.27 ml/m RA  Volume:   18.50 ml  10.02 ml/m  AORTIC VALVE LVOT Vmax:   74.40 cm/s LVOT Vmean:  57.100 cm/s LVOT VTI:    0.111 m  AORTA Ao Root diam: 3.30 cm MITRAL VALVE MV Area (PHT): 2.63 cm    SHUNTS MV Decel Time: 288 msec    Systemic VTI:  0.11 m MV E velocity: 60.00 cm/s  Systemic Diam: 2.00 cm MV A velocity: 71.10 cm/s MV E/A ratio:  0.84 MV A Prime:    10.0 cm/s Dorris Carnes MD Electronically signed by Dorris Carnes MD Signature Date/Time: 04/17/2020/10:28:46 AM    Final      CBC Recent Labs  Lab 04/14/20 0935 04/14/20 1146 04/15/20 0425 04/16/20 0703 04/19/20 0623  WBC 12.7* 14.6* 15.7* 14.6* 6.9  HGB 12.7* 11.9* 11.2* 11.7* 11.0*  HCT 40.1 38.0* 35.1* 39.1 35.2*  PLT 253 247 263 283 317  MCV 93.0 94.1 93.9 98.2 93.1  MCH 29.5 29.5 29.9 29.4 29.1  MCHC 31.7 31.3 31.9 29.9* 31.3  RDW 13.9 13.9 14.0 14.1 13.9  LYMPHSABS 1.3 1.3  --   --   --   MONOABS 0.9 1.1*  --   --   --   EOSABS 0.0 0.0  --   --   --   BASOSABS 0.0 0.0  --   --   --     Chemistries  Recent Labs  Lab 04/14/20 0935 04/14/20 1146 04/15/20 0425 04/15/20 0834 04/16/20 0703 04/18/20 0615 04/19/20 0623  NA 144   < > 149* 150* 156* 144 143  K 4.2   < > 3.3* 3.4* 3.2* 3.1* 3.7  CL 97*   < > 109 109 110 109 110  CO2 29   < > 28 27 27 25 23   GLUCOSE 511*   < > 175* 286* 119* 152* 91  BUN 47*   < > 40* 42* 42* 46* 38*  CREATININE 1.49*   < > 1.28* 1.30* 1.47* 1.40* 1.19  CALCIUM 9.6   < > 9.0 9.2 9.8 8.5* 8.6*  MG 2.9*  --   --   --   --  2.4  --   AST 11*  --   --   --  20  --   --   ALT 14  --   --   --  13  --   --  ALKPHOS 90  --   --   --  76  --   --   BILITOT 0.8  --   --   --  0.5  --   --    < > = values in this interval not displayed.   ------------------------------------------------------------------------------------------------------------------ No results for input(s): CHOL, HDL, LDLCALC, TRIG, CHOLHDL, LDLDIRECT in the last 72 hours.  Lab Results  Component Value Date   HGBA1C 8.9 (H) 04/14/2020    ------------------------------------------------------------------------------------------------------------------ No results for input(s): TSH, T4TOTAL, T3FREE, THYROIDAB in the last 72 hours.  Invalid input(s): FREET3 ------------------------------------------------------------------------------------------------------------------ No results for input(s): VITAMINB12, FOLATE, FERRITIN, TIBC, IRON, RETICCTPCT in the last 72 hours.  Coagulation profile No results for input(s): INR, PROTIME in the last 168 hours.  No results for input(s): DDIMER in the last 72 hours.  Cardiac Enzymes No results for input(s): CKMB, TROPONINI, MYOGLOBIN in the last 168 hours.  Invalid input(s): CK ------------------------------------------------------------------------------------------------------------------    Component Value Date/Time   BNP 298.0 (H) 03/23/2020 1705   BNP 101 (H) 08/19/2018 1015     Roxan Hockey M.D on 04/19/2020 at 4:15 PM  Go to [email or web address] - for contact info  [facility] - Office  [phone]

## 2020-04-19 NOTE — TOC Progression Note (Addendum)
Transition of Care [facility]) - Progression Note    Patient Details  Name: HANAN MOEN MRN: [id number] Date of Birth: 1935-08-16  Transition of Care [facility]) CM/SW Contact  Shade Flood, LCSW Phone Number: 04/19/2020, 4:13 PM  Clinical Narrative:     Damaris Schooner with Jeneen Rinks from PT who states that recommendation is for HHPT. Attempted to update pt's son but could not reach him by phone. TOC will attempt to arrange [facility] RN and PT for pt if able to find a [place] agency that will accept pt's insurance.  MD anticipating dc tomorrow. Assigned TOC will follow.  1628: Cory from [facility] stated that they will accept referral. Also, pt will need Zyvox from [facility] outpatient pharmacy for dc. MD and AP Pharmacist, Jonelle Sidle, aware.  Expected Discharge Plan: [place] Barriers to Discharge: SNF Pending bed offer, Insurance Authorization  Expected Discharge Plan and Services Expected Discharge Plan: [facility] In-house Referral: Clinical Social Work   Post Acute Care Choice: [place] arrangements for the past 2 months: Single Family Home                                       Social Determinants of Health (SDOH) Interventions    Readmission Risk Interventions Readmission Risk Prevention Plan 04/19/2020  Transportation Screening Complete  Medication Review Press photographer) Complete  SW Recovery Care/Counseling Consult Complete  Palliative Care Screening Not Applicable  Some recent data might be hidden

## 2020-04-19 NOTE — Care Management Important Message (Signed)
Important Message  Patient Details  Name: Edward [name] MRN: [id number] Date of Birth: Dec 17, 1934   Medicare Important Message Given:  Yes Edward Haw, RN will deliver due to contact precautions)     Edward [name] 04/19/2020, 2:48 PM

## 2020-04-19 NOTE — TOC Initial Note (Signed)
Transition of Care [facility]) - Initial/Assessment Note    Patient Details  Name: Edward [name] MRN: [id number] Date of Birth: 06-19-1935  Transition of Care [facility]) CM/SW Contact:    Shade Flood, LCSW Phone Number: 04/19/2020, 11:31 AM  Clinical Narrative:                  Pt admitted from home. He lives with his wife and son. He is active with [facility]. Pt was recently hospitalized here at [facility] at the end of June. HH had been recommended for patient at the time of that dc and pt refused.  MD recommending SNF rehab at dc. PT eval pending. Pt not fully oriented at this time. Spoke with pt's wife and pt's son by phone to discuss dc planning. Pt's son stated that MD did inform them of his recommendation for SNF rehab. Pt's son states that pt told him he prefers to go home. Discussed pt's needs for medication management and therapy for recovery from hospital stay. Ultimately, pt's wife and son agreed to SNF referrals but state that they may take pt home if he refuses SNF. Provided CMS SNF options and will refer as requested.  Pt will need insurance auth as well. Will initiate auth once PT recommendation/note is in the record.  TOC will follow.   Expected Discharge Plan: Skilled Nursing Facility Barriers to Discharge: SNF Pending bed offer, Insurance Authorization   Patient Goals and CMS Choice Patient states their goals for this hospitalization and ongoing recovery are:: go home CMS Medicare.gov Compare Post Acute Care list provided to:: Patient Represenative (must comment) Choice offered to / list presented to : Spouse, Adult Children  Expected Discharge Plan and Services Expected Discharge Plan: [place] In-house Referral: Clinical Social Work   Post Acute Care Choice: [place] Living arrangements for the past 2 months: [place]                                      Prior Living Arrangements/Services Living  arrangements for the past 2 months: Single Family Home Lives with:: Adult Children, Spouse Patient language and need for interpreter reviewed:: Yes Do you feel safe going back to the place where you live?: Yes      Need for Family Participation in Patient Care: Yes (Comment) Care giver support system in place?: Yes (comment)   Criminal Activity/Legal Involvement Pertinent to Current Situation/Hospitalization: No - Comment as needed  Activities of Daily Living Home Assistive Devices/Equipment: None ADL Screening (condition at time of admission) Patient's cognitive ability adequate to safely complete daily activities?: No (baseline dementia) Is the patient deaf or have difficulty hearing?: No Does the patient have difficulty seeing, even when wearing glasses/contacts?: Yes Does the patient have difficulty concentrating, remembering, or making decisions?: Yes Patient able to express need for assistance with ADLs?: Yes Does the patient have difficulty dressing or bathing?: Yes Independently performs ADLs?: Yes (appropriate for developmental age) Does the patient have difficulty walking or climbing stairs?: Yes Weakness of Legs: None Weakness of Arms/Hands: None  Permission Sought/Granted Permission sought to share information with : Facility Art therapist granted to share information with : Yes, Verbal Permission Granted     Permission granted to share info w AGENCY: local snfs        Emotional Assessment       Orientation: : Oriented to Self Alcohol /  Substance Use: Not Applicable Psych Involvement: No (comment)  Admission diagnosis:  Hypernatremia [E87.0] Hyperglycemia [R73.9] Acute encephalopathy [F75.88] Acute metabolic encephalopathy [T25.49] Bacteremia [R78.81] Patient Active Problem List   Diagnosis Date Noted  . MRSA bacteremia 04/16/2020  . Acute kidney injury ([place]) 04/16/2020  . Acute metabolic encephalopathy 82/64/1583  . Altered mental  status 03/23/2020  . Normocytic anemia 03/23/2020  . Mixed hyperlipidemia 03/06/2019  . Parotitis 11/01/2018  . Syncope 10/30/2018  . Facial cellulitis 10/30/2018  . Chronic respiratory failure with hypoxia ([place]) 10/30/2018  . Chronic diastolic heart failure ([place])   . Hypoxia   . Atrial fibrillation ([place])   . COPD with acute exacerbation ([place]) 07/31/2018  . Hyponatremia 07/31/2018  . COPD (chronic obstructive pulmonary disease) ([place]) 05/21/2018  . Stage 3 chronic kidney disease 05/21/2018  . Diastolic congestive heart failure ([place]) 05/21/2018  . Type 2 diabetes mellitus ([place]) 05/21/2018  . HTN (hypertension) 05/21/2018  . Iron deficiency anemia due to chronic blood loss 04/30/2018  . Respiratory distress   . Hematochezia   . Goals of care, counseling/discussion   . Palliative care by specialist   . DNR (do not resuscitate) discussion   . Acute renal failure with acute tubular necrosis superimposed on stage 3 chronic kidney disease ([place])   . Palliative care encounter   . COPD exacerbation ([place]) 04/17/2018  . Acute on chronic diastolic CHF (congestive heart failure) ([place])   . Severe anemia 02/17/2018  . Bradycardia 02/17/2018  . Hypothermia   . Gastroesophageal reflux disease   . Acute encephalopathy 09/25/2017  . On home O2 09/25/2017  . Bilateral lower extremity edema 06/27/2017  . Insomnia 03/28/2017  . CKD (chronic kidney disease), stage III 03/07/2017  . AF (paroxysmal atrial fibrillation) ([place]) 03/05/2017  . Chronic diastolic CHF (congestive heart failure) ([place]) 03/04/2017  . HCAP (healthcare-associated pneumonia) 03/04/2017  . Sepsis due to pneumonia ([place]) 02/25/2017  . Constipation 02/25/2017  . Overflow diarrhea/Constipation 02/25/2017  . DM type 2 causing vascular disease ([place]) 10/11/2016  . Non compliance w medication regimen 12/02/2015  . Hypercholesterolemia 05/12/2014  . Acute on chronic respiratory failure with hypoxemia ([place]) 11/17/2013  . Elevated PSA  01/20/2013  . Hypertension   . Elevated lipids   . Pulmonary fibrosis ([place])   . Colon polyps   . Colon polyps    PCP:  Susy Frizzle, MD Pharmacy:   [place], [place] - [address] [place] [address] [place] [address] [place] [address] Phone: [phone] Fax: [phone]  [place], [place] [address] [place] [address] [place] [address] Phone: [phone] Fax: [phone]  [facility] [place], [place] - [address] [place] [address] Phone: [phone] Fax: [phone]     Social Determinants of Health ([place]) Interventions    Readmission Risk Interventions Readmission Risk Prevention Plan 04/19/2020  Transportation Screening Complete  Medication Review Press photographer) Complete  SW Recovery Care/Counseling Consult Complete  Palliative Care Screening Not Applicable  Some recent data might be hidden

## 2020-04-19 NOTE — Evaluation (Signed)
Physical Therapy Evaluation Patient Details Name: Edward [name] MRN: [id number] DOB: 02/19/1935 Today's Date: 04/19/2020   History of Present Illness  Edward [name]  is a 85 y.o. male  history significant for  PAF (atrial fibrillation), pulmonary fibrosis, COPD on home oxygen, type II diabetes mellitus, hyperlipidemia, hypertension, iron deficiency anemia, history of noncompliance, PSA elevation, vitamin D ongoing hypoglycemia on documentable encephalopathy on 04/14/2020 -Patient is very confused and a poor historian-Patient son tells me that patient's blood sugar readings have been reading high which means over 600 for the last couple of days-Patient's wife tells me that patient has been refusing to take insulin for the last couple days-In the ED his bicarb is 28 anion gap is 18 glucose over 500-Patient received IV fluids and anion gap is down to 14--he remains confused and disoriented-Apparently no vomiting no diarrhea no fevers no chest pain    Clinical Impression  Patient functioning near baseline for functional mobility and gait, ambulated in room and hallway without loss of balance, frequent bumping into nearby objects with walker without loss of balance and demonstrates good return for transferring into/out of without problem.  Plan:  Patient discharged from physical therapy to care of nursing for ambulation daily as tolerated for length of stay.     Follow Up Recommendations Home health PT;Supervision for mobility/OOB;Supervision - Intermittent    Equipment Recommendations  None recommended by PT    Recommendations for Other Services       Precautions / Restrictions Precautions Precautions: Fall Restrictions Weight Bearing Restrictions: No      Mobility  Bed Mobility Overal bed mobility: Modified Independent                Transfers Overall transfer level: Needs assistance Equipment used: Rolling walker (2 wheeled) Transfers: Sit to/from [employer]  Transfers Sit to Stand: Modified independent (Device/Increase time) Stand pivot transfers: Supervision          Ambulation/Gait Ambulation/Gait assistance: Supervision;Min guard Gait Distance (Feet): 100 Feet Assistive device: Rolling walker (2 wheeled) Gait Pattern/deviations: Decreased step length - right;Decreased step length - left;Decreased stride length Gait velocity: decreased   General Gait Details: slightly labored cadence with frequent bumping into objects with RW without loss of balance, limited secondary to c/o fatigue  Stairs            Wheelchair Mobility    Modified Rankin (Stroke Patients Only)       Balance Overall balance assessment: Needs assistance Sitting-balance support: No upper extremity supported Sitting balance-Leahy Scale: Good Sitting balance - Comments: seated at EOB   Standing balance support: During functional activity;Bilateral upper extremity supported Standing balance-Leahy Scale: Fair Standing balance comment: using RW                             Pertinent Vitals/Pain Pain Assessment: No/denies pain    Home Living Family/patient expects to be discharged to:: Private residence Living Arrangements: Spouse/significant other Available Help at Discharge: Family;Available 24 hours/day Type of Home: Mobile home Home Access: Stairs to enter Entrance Stairs-Rails: Right;Left;Can reach both Entrance Stairs-Number of Steps: 5 Home Layout: One level Home Equipment: Cane - single point;Walker - 4 wheels;Bedside commode      Prior Function Level of Independence: Independent with assistive device(s)         Comments: Household ambulator using RW     Hand Dominance   Dominant Hand: Right    Extremity/Trunk Assessment   Upper Extremity Assessment  Upper Extremity Assessment: Overall WFL for tasks assessed    Lower Extremity Assessment Lower Extremity Assessment: Generalized weakness    Cervical / Trunk  Assessment Cervical / Trunk Assessment: Normal  Communication   Communication: No difficulties  Cognition Arousal/Alertness: Awake/alert Behavior During Therapy: WFL for tasks assessed/performed Overall Cognitive Status: Within Functional Limits for tasks assessed                                        General Comments      Exercises     Assessment/Plan    PT Assessment All further PT needs can be met in the next venue of care  PT Problem List Decreased strength;Decreased activity tolerance;Decreased balance;Decreased mobility       PT Treatment Interventions      PT Goals (Current goals can be found in the Care Plan section)  Acute Rehab PT Goals Patient Stated Goal: return home with family to assist PT Goal Formulation: With patient/family Time For Goal Achievement: 04/19/20 Potential to Achieve Goals: Good    Frequency     Barriers to discharge        Co-evaluation               AM-PAC PT "6 Clicks" Mobility  Outcome Measure Help needed turning from your back to your side while in a flat bed without using bedrails?: None Help needed moving from lying on your back to sitting on the side of a flat bed without using bedrails?: None Help needed moving to and from a bed to a chair (including a wheelchair)?: None Help needed standing up from a chair using your arms (e.g., wheelchair or bedside chair)?: None Help needed to walk in hospital room?: A Little Help needed climbing 3-5 steps with a railing? : A Little 6 Click Score: 22    End of Session Equipment Utilized During Treatment: Oxygen Activity Tolerance: Patient tolerated treatment well Patient left: in chair;with call bell/phone within reach;with chair alarm set Nurse Communication: Mobility status PT Visit Diagnosis: Unsteadiness on feet (R26.81);Other abnormalities of gait and mobility (R26.89);Muscle weakness (generalized) (M62.81)    Time: 9357-0177 PT Time Calculation (min)  (ACUTE ONLY): 23 min   Charges:   PT Evaluation $PT Eval Moderate Complexity: 1 Mod PT Treatments $Therapeutic Activity: 23-37 mins        3:56 PM, 04/19/20 Lonell Grandchild, MPT Physical Therapist with [facility] [phone] office [phone] mobile phone w

## 2020-04-19 NOTE — NC FL2 (Signed)
[place] LEVEL OF CARE SCREENING TOOL     IDENTIFICATION  Patient Name: Edward [name] Birthdate: 01/10/1935 Sex: male Admission Date (Current Location): 04/14/2020  [facility] and [place] Number:  [employer] and Address:  [facility] [address], [place]      Provider Number: [id number]  Attending Physician Name and Address:  Roxan Hockey, MD  Relative Name and Phone Number:       Current Level of Care: Hospital Recommended Level of Care: [place] Prior Approval Number:    Date Approved/Denied:   PASRR Number: [id number]  Discharge Plan: SNF    Current Diagnoses: Patient Active Problem List   Diagnosis Date Noted  . MRSA bacteremia 04/16/2020  . Acute kidney injury ([place]) 04/16/2020  . Acute metabolic encephalopathy 81/82/9937  . Altered mental status 03/23/2020  . Normocytic anemia 03/23/2020  . Mixed hyperlipidemia 03/06/2019  . Parotitis 11/01/2018  . Syncope 10/30/2018  . Facial cellulitis 10/30/2018  . Chronic respiratory failure with hypoxia ([place]) 10/30/2018  . Chronic diastolic heart failure ([place])   . Hypoxia   . Atrial fibrillation ([place])   . COPD with acute exacerbation ([place]) 07/31/2018  . Hyponatremia 07/31/2018  . COPD (chronic obstructive pulmonary disease) ([place]) 05/21/2018  . Stage 3 chronic kidney disease 05/21/2018  . Diastolic congestive heart failure ([place]) 05/21/2018  . Type 2 diabetes mellitus ([place]) 05/21/2018  . HTN (hypertension) 05/21/2018  . Iron deficiency anemia due to chronic blood loss 04/30/2018  . Respiratory distress   . Hematochezia   . Goals of care, counseling/discussion   . Palliative care by specialist   . DNR (do not resuscitate) discussion   . Acute renal failure with acute tubular necrosis superimposed on stage 3 chronic kidney disease ([place])   . Palliative care encounter   . COPD exacerbation ([place]) 04/17/2018  . Acute on chronic diastolic CHF  (congestive heart failure) ([place])   . Severe anemia 02/17/2018  . Bradycardia 02/17/2018  . Hypothermia   . Gastroesophageal reflux disease   . Acute encephalopathy 09/25/2017  . On home O2 09/25/2017  . Bilateral lower extremity edema 06/27/2017  . Insomnia 03/28/2017  . CKD (chronic kidney disease), stage III 03/07/2017  . AF (paroxysmal atrial fibrillation) ([place]) 03/05/2017  . Chronic diastolic CHF (congestive heart failure) ([place]) 03/04/2017  . HCAP (healthcare-associated pneumonia) 03/04/2017  . Sepsis due to pneumonia ([place]) 02/25/2017  . Constipation 02/25/2017  . Overflow diarrhea/Constipation 02/25/2017  . DM type 2 causing vascular disease ([place]) 10/11/2016  . Non compliance w medication regimen 12/02/2015  . Hypercholesterolemia 05/12/2014  . Acute on chronic respiratory failure with hypoxemia ([place]) 11/17/2013  . Elevated PSA 01/20/2013  . Hypertension   . Elevated lipids   . Pulmonary fibrosis ([place])   . Colon polyps   . Colon polyps     Orientation RESPIRATION BLADDER Height & Weight     Self  Normal Continent Weight: 165 lb 9.1 oz (75.1 kg) Height:     BEHAVIORAL SYMPTOMS/MOOD NEUROLOGICAL BOWEL NUTRITION STATUS      Continent Diet (see dc summary)  AMBULATORY STATUS COMMUNICATION OF NEEDS Skin   Extensive Assist Verbally Normal                       Personal Care Assistance Level of Assistance  Bathing, Feeding, Dressing Bathing Assistance: Limited assistance Feeding assistance: Independent Dressing Assistance: Limited assistance     Functional Limitations Info  Sight, Hearing, Speech  Sight Info: Adequate Hearing Info: Adequate Speech Info: Adequate    SPECIAL CARE FACTORS FREQUENCY  PT (By licensed PT), OT (By licensed OT)     PT Frequency: 5x weekly OT Frequency: 3x weekly            Contractures Contractures Info: Not present    Additional Factors Info  Code Status, Allergies Code Status Info: Full Allergies Info: Ace Inhibitors            Current Medications (04/19/2020):  This is the current hospital active medication list Current Facility-Administered Medications  Medication Dose Route Frequency Provider Last Rate Last Admin  . 0.45 % sodium chloride infusion   Intravenous Continuous Emokpae, Courage, MD 50 mL/hr at 04/19/20 0411 Rate Verify at 04/19/20 0411  . 0.9 %  sodium chloride infusion  250 mL Intravenous PRN Emokpae, Courage, MD      . acetaminophen (TYLENOL) tablet 650 mg  650 mg Oral Q6H PRN Emokpae, Courage, MD       Or  . acetaminophen (TYLENOL) suppository 650 mg  650 mg Rectal Q6H PRN [place] Brick, Courage, MD   650 mg at 04/16/20 1348  . albuterol (PROVENTIL) (2.5 MG/3ML) 0.083% nebulizer solution 2.5 mg  2.5 mg Nebulization Q2H PRN Emokpae, Courage, MD      . apixaban (ELIQUIS) tablet 5 mg  5 mg Oral BID Emokpae, Courage, MD   5 mg at 04/19/20 1007  . cloNIDine (CATAPRES) tablet 0.1 mg  0.1 mg Oral Daily Emokpae, Courage, MD   0.1 mg at 04/19/20 1007  . dextrose 50 % solution 0-50 mL  0-50 mL Intravenous PRN [place] Brick, Courage, MD   50 mL at 22/63/33 5456  . folic acid (FOLVITE) tablet 1 mg  1 mg Oral Daily Emokpae, Courage, MD   1 mg at 04/19/20 1006  . haloperidol lactate (HALDOL) injection 5 mg  5 mg Intramuscular Q6H PRN Emokpae, Courage, MD      . insulin aspart (novoLOG) injection 0-20 Units  0-20 Units Subcutaneous TID WC Emokpae, Courage, MD   3 Units at 04/18/20 1314  . insulin aspart (novoLOG) injection 0-5 Units  0-5 Units Subcutaneous QHS Roxan Hockey, MD   2 Units at 04/15/20 2142  . insulin aspart (novoLOG) injection 3 Units  3 Units Subcutaneous TID WC Roxan Hockey, MD   3 Units at 04/18/20 1313  . insulin glargine (LANTUS) injection 20 Units  20 Units Subcutaneous QHS Roxan Hockey, MD   20 Units at 04/18/20 2225  . LORazepam (ATIVAN) injection 1 mg  1 mg Intravenous Q6H PRN Roxan Hockey, MD   1 mg at 04/17/20 0752  . metoprolol tartrate (LOPRESSOR) injection 2.5 mg  2.5 mg  Intravenous Q6H PRN Adefeso, Oladapo, DO   2.5 mg at 04/15/20 2315  . metoprolol tartrate (LOPRESSOR) tablet 50 mg  50 mg Oral BID [place] Brick, Courage, MD   50 mg at 04/19/20 1006  . mometasone-formoterol (DULERA) 200-5 MCG/ACT inhaler 2 puff  2 puff Inhalation BID Roxan Hockey, MD   2 puff at 04/18/20 0824  . ondansetron (ZOFRAN) tablet 4 mg  4 mg Oral Q6H PRN Emokpae, Courage, MD       Or  . ondansetron (ZOFRAN) injection 4 mg  4 mg Intravenous Q6H PRN Emokpae, Courage, MD      . polyethylene glycol (MIRALAX / GLYCOLAX) packet 17 g  17 g Oral Daily PRN Emokpae, Courage, MD      . pravastatin (PRAVACHOL) tablet 80 mg  80 mg Oral QHS Emokpae,  Courage, MD   80 mg at 04/18/20 2225  . sodium chloride flush (NS) 0.9 % injection 3 mL  3 mL Intravenous Q12H Emokpae, Courage, MD   3 mL at 04/19/20 1007  . sodium chloride flush (NS) 0.9 % injection 3 mL  3 mL Intravenous PRN Emokpae, Courage, MD      . thiamine tablet 100 mg  100 mg Oral Daily Emokpae, Courage, MD   100 mg at 04/19/20 1007  . vancomycin (VANCOREADY) IVPB 750 mg/150 mL  750 mg Intravenous Q24H Roxan Hockey, MD   Stopped at 04/18/20 1844     Discharge Medications: Please see discharge summary for a list of discharge medications.  Relevant Imaging Results:  Relevant Lab Results:   Additional Information SSN: [id number], LCSW

## 2020-04-19 NOTE — Progress Notes (Signed)
Inpatient Diabetes Program Recommendations  AACE/ADA: New Consensus Statement on Inpatient Glycemic Control   Target Ranges:  Prepandial:   less than 140 mg/dL      Peak postprandial:   less than 180 mg/dL (1-2 hours)      Critically ill patients:  140 - 180 mg/dL   Results for LORENSO, Edward [name] (MRN [id number]) as of 04/19/2020 10:37  Ref. Range 04/18/2020 09:01 04/18/2020 11:24 04/18/2020 16:14 04/18/2020 17:37 04/18/2020 22:25 04/19/2020 07:17  Glucose-Capillary Latest Ref Range: 70 - 99 mg/dL 142 (H)  Novolog 6 units (3 meal cov+ 3 SSI)  0% meal (refused) 144 (H)  Novolog 6 units (3 meal cov+ 3 SSI)  25% meal 64 (L)    79      75% meal       Lantus 20 units 93   Review of Glycemic Control  Diabetes history: DM2 Outpatient Diabetes medications: Basaglar 30 units QHS, Metformin 500 mg BID Current orders for Inpatient glycemic control: Lantus 20 units QHS, Novolog 0-20 units TID with meals, Novolog 0-5 units QHS, Novolog 3 units TID with meals for meal coverage  Inpatient Diabetes Program Recommendations:    Insulin-Meal Coverage: RNs, please do NOT give meal coverage insulin if patient eats less than 50% of meals and/or premeal glucose less than 80%. Novolog 3 units TID order has hold parameters within order.   NOTE: In reviewing chart, noted patient received meal coverage insulin with breakfast on 04/18/20 and did not eat any of meal. Patient received meal coverage insulin for lunch on 04/18/20 and only ate 25% of meal. Following CBG down to 64 mg/dl at 16:14 on 04/18/20. RNs please use hold parameters within meal coverage order to determine if meal coverage should be given (do NOT give if CBG < 80 mg/dl premeal or if patient eats less than 50% of meal).  Thanks, Barnie Alderman, RN, MSN, CDE Diabetes Coordinator Inpatient Diabetes Program [phone] (Team Pager from 8am to 5pm)

## 2020-04-19 NOTE — Progress Notes (Signed)
Patient ID: Edward [name], male   DOB: 1935/01/01, 85 y.o.   MRN: [id number]         [facility]    Date of Admission:  04/14/2020   Day 4 vancomycin         [name] has MRSA bacteremia.  Repeat blood cultures remain negative.  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  His family does not want to proceed with TEE and is worried about sending him home with IV vancomycin and a PICC as he has been confused and would probably pull the PICC out.  I recommend oral linezolid for 4 weeks with the start date of 04/16/2020.  Please call if I can be of further assistance.         Michel Bickers, MD [facility] [phone] pager   [phone] cell 04/19/2020, 8:31 AM

## 2020-04-20 DIAGNOSIS — A4102 Sepsis due to Methicillin resistant Staphylococcus aureus: Secondary | ICD-10-CM | POA: Diagnosis present

## 2020-04-20 LAB — CULTURE, BLOOD (ROUTINE X 2)
Culture: NO GROWTH
Special Requests: ADEQUATE

## 2020-04-20 LAB — GLUCOSE, CAPILLARY
Glucose-Capillary: 228 mg/dL — ABNORMAL HIGH (ref 70–99)
Glucose-Capillary: 117 mg/dL — ABNORMAL HIGH (ref 70–99)
Glucose-Capillary: 58 mg/dL — ABNORMAL LOW (ref 70–99)
Glucose-Capillary: 115 mg/dL — ABNORMAL HIGH (ref 70–99)
Glucose-Capillary: 164 mg/dL — ABNORMAL HIGH (ref 70–99)

## 2020-04-20 LAB — BASIC METABOLIC PANEL
Anion gap: 9 (ref 5–15)
BUN: 31 mg/dL — ABNORMAL HIGH (ref 8–23)
CO2: 22 mmol/L (ref 22–32)
Calcium: 8.7 mg/dL — ABNORMAL LOW (ref 8.9–10.3)
Chloride: 111 mmol/L (ref 98–111)
Creatinine, Ser: 1.14 mg/dL (ref 0.61–1.24)
GFR calc Af Amer: >60 mL/min (ref >60)
GFR calc non Af Amer: 60 mL/min — ABNORMAL LOW (ref >60)
Glucose, Bld: 130 mg/dL — ABNORMAL HIGH (ref 70–99)
Potassium: 3.6 mmol/L (ref 3.5–5.1)
Sodium: 142 mmol/L (ref 135–145)

## 2020-04-20 NOTE — Progress Notes (Signed)
Inpatient Diabetes Program Recommendations  AACE/ADA: New Consensus Statement on Inpatient Glycemic Control   Target Ranges:  Prepandial:   less than 140 mg/dL      Peak postprandial:   less than 180 mg/dL (1-2 hours)      Critically ill patients:  140 - 180 mg/dL   Results for Edward [name], Edward [name] (MRN [id number]) as of 04/20/2020 10:10  Ref. Range 04/19/2020 07:17 04/19/2020 11:17 04/19/2020 16:28 04/19/2020 21:25 04/20/2020 04:45 04/20/2020 05:17 04/20/2020 07:52  Glucose-Capillary Latest Ref Range: 70 - 99 mg/dL 93 186 (H) 157 (H) 228 (H) 58 (L) 117 (H) 115 (H)   Review of Glycemic Control  Diabetes history: DM2 Outpatient Diabetes medications: Basaglar 30 units QHS, Metformin 500 mg BID Current orders for Inpatient glycemic control: Lantus 20 units QHS, Novolog 0-20 units TID with meals, Novolog 0-5 units QHS, Novolog 3 units TID with meals for meal coverage  Inpatient Diabetes Program Recommendations:    Insulin-Basal: Fasting glucose 58 mg/dl this morning. Please consider decreasing Lantus to 17 units QHS.  Thanks, Barnie Alderman, RN, MSN, CDE Diabetes Coordinator Inpatient Diabetes Program [phone] (Team Pager from 8am to 5pm)

## 2020-04-20 NOTE — Plan of Care (Signed)

## 2020-04-20 NOTE — Progress Notes (Signed)
Nsg Discharge Note  Admit Date:  04/14/2020 Discharge date: 04/20/2020   Edward [name] to be D/C'd home per MD order.  AVS completed.  Copy for chart, and copy for patient signed, and dated. Patient/caregiver able to verbalize understanding.  Discharge Medication: Allergies as of 04/20/2020      Reactions   Ace Inhibitors Other (See Comments)   Hyperkalemia--07/23/2013:patient states not familiar with the following allergy      Medication List    TAKE these medications   acetaminophen 500 MG tablet Commonly known as: TYLENOL Take 500 mg by mouth every 6 (six) hours as needed for headache.   albuterol (2.5 MG/3ML) 0.083% nebulizer solution Commonly known as: PROVENTIL INHALE 1 VIAL VIA NEBULIZER EVERY 6 HOURS AS NEEDED FOR WHEEZING OR SHORTNESS OF BREATH   Basaglar KwikPen 100 UNIT/ML Inject 0.24 mLs (24 Units total) into the skin at bedtime. What changed: how much to take   budesonide-formoterol 160-4.5 MCG/ACT inhaler Commonly known as: SYMBICORT Inhale 2 puffs into the lungs 2 (two) times daily.   cloNIDine 0.1 MG tablet Commonly known as: CATAPRES Take 1 tablet (0.1 mg total) by mouth daily.   Eliquis 5 MG Tabs tablet Generic drug: apixaban Take 1 tablet by mouth twice daily   furosemide 20 MG tablet Commonly known as: LASIX Take 1 tablet (20 mg total) by mouth daily. What changed:   medication strength  how much to take   ipratropium-albuterol 0.5-2.5 (3) MG/3ML Soln Commonly known as: DUONEB Take 3 mLs by nebulization every 6 (six) hours as needed.   linezolid 600 MG tablet Commonly known as: Zyvox Take 1 tablet (600 mg total) by mouth 2 (two) times daily.   metFORMIN 1000 MG tablet Commonly known as: GLUCOPHAGE Take 1 tablet (1,000 mg total) by mouth 2 (two) times daily with a meal. What changed: how much to take   metoprolol tartrate 50 MG tablet Commonly known as: LOPRESSOR Take 1 tablet (50 mg total) by mouth 2 (two) times daily. What changed:    medication strength  how much to take   OXYGEN Inhale 3 L into the lungs continuous.   potassium chloride SA 20 MEQ tablet Commonly known as: KLOR-CON Take 1 tablet (20 mEq total) by mouth daily. What changed: when to take this   pravastatin 80 MG tablet Commonly known as: PRAVACHOL TAKE 1 TABLET BY MOUTH AT BEDTIME   pregabalin 100 MG capsule Commonly known as: Lyrica Take 1 capsule (100 mg total) by mouth 2 (two) times daily.       Discharge Assessment: Vitals:   04/20/20 0553 04/20/20 0910  BP: (!) 147/70   Pulse: 87   Resp: 20   Temp: 98 F (36.7 C)   SpO2: 97% 97%   Skin clean, dry and intact without evidence of skin break down, no evidence of skin tears noted. IV catheter discontinued intact. Site without signs and symptoms of complications - no redness or edema noted at insertion site, patient denies c/o pain - only slight tenderness at site.  Dressing with slight pressure applied.  D/c Instructions-Education: Discharge instructions given to patient/family with verbalized understanding. D/c education completed with patient/family including follow up instructions, medication list, d/c activities limitations if indicated, with other d/c instructions as indicated by MD - patient able to verbalize understanding, all questions fully answered. Patient instructed to return to ED, call 911, or call MD for any changes in condition.  Patient escorted via [place], and D/C home via private auto.  Quay Burow  Marlou Sa, RN 04/20/2020 3:57 PM

## 2020-04-20 NOTE — Discharge Instructions (Signed)
1)Avoid ibuprofen/Advil/Aleve/Motrin/Goody Powders/Naproxen/BC powders/Meloxicam/Diclofenac/Indomethacin and other Nonsteroidal anti-inflammatory medications as these will make you more likely to bleed and can cause stomach ulcers, can also cause Kidney problems.   2)Increase Metformin to 1000 mg twice daily  3)Take the antibiotic Zyvox (Linezolid) 600 mg twice a day for 1 full month due to MRSA bacterial infection in your blood  4)You will Need CBC and BMP blood tests every Monday for the next 5 weeks starting April 26, 2020  5) please change Lantus/glargine insulin injection to 24 units daily

## 2020-04-20 NOTE — Progress Notes (Signed)
Patient had a CBG of 58.  Patient given food and drink.  CBG increased to 117.  Patient stable and at baseline mentation.  Will continue to monitor patient.

## 2020-04-20 NOTE — TOC Benefit Eligibility Note (Signed)
Transition of Care [facility]) Benefit Eligibility Note    Patient Details  Name: Edward [name] MRN: [id number] Date of Birth: 1934/12/13   Medication/Dose: (Linezolid) 600 mg twice a day for 30 days  Covered?: Yes  Tier: Other (tier 4)  Prescription Coverage Preferred Pharmacy: NA  Spoke with Person/Company/Phone Number:: [name] with [employer] at [phone]  Co-Pay: $100 ( member is initial coverage phase)  Prior Approval: No  Deductible: Met  Additional Notes: brand name Zyvox IS NOT covered    Tommy Medal Phone Number: 04/20/2020, 2:55 PM

## 2020-04-20 NOTE — TOC Transition Note (Addendum)
  Transition of Care [facility]) - CM/SW Discharge Note   Patient Details  Name: Edward [name] MRN: [id number] Date of Birth: December 11, 1934  Transition of Care [facility]) CM/SW Contact:  Ihor Gully, LCSW Phone Number: 04/20/2020, 1:55 PM   Clinical Narrative:    Patient remains active with [facility]. HH RN/PT set up. Patient educated on the [place] and is agreeable to referral. Integrated health program referral made. Hospital pharmacist confirmed that [facility] has Zyvox available for patient. Patient advised that they have Zyvox at [facility].   Benefits check advised that [facility] can fill generic Zyvox for $100. Patient states they were told $187. Patient and son agreeable that they can afford Zyvox.    Final next level of care: [place] Barriers to Discharge: No Barriers Identified   Patient Goals and CMS Choice Patient states their goals for this hospitalization and ongoing recovery are:: go home CMS Medicare.gov Compare Post Acute Care list provided to:: Patient Represenative (must comment) Choice offered to / list presented to : Spouse, Adult Children  Discharge Placement                       Discharge Plan and Services In-house Referral: Clinical Social Work   Post Acute Care Choice: [place]                    HH Arranged: RN, PT [facility] Agency: [place] Date [place]: 04/19/20 Time [place]: 1627 Representative spoke with at [place]: [place] Determinants of Health ([place]) Interventions     Readmission Risk Interventions Readmission Risk Prevention Plan 04/19/2020  Transportation Screening Complete  Medication Review Press photographer) Complete  HRI or [place] Complete  SW Recovery Care/Counseling Consult Complete  Palliative Care Screening Not Applicable  Some recent data might be hidden

## 2020-04-20 NOTE — Discharge Summary (Addendum)
Edward [name], is a 85 y.o. male  DOB 11-24-1934  MRN [id number].  Admission date:  04/14/2020  Admitting Physician  Lakeria Starkman Denton Brick, MD  Discharge Date:  04/20/2020   Primary MD  Susy Frizzle, MD  Recommendations for primary care physician for things to follow:   1)Avoid ibuprofen/Advil/Aleve/Motrin/Goody Powders/Naproxen/BC powders/Meloxicam/Diclofenac/Indomethacin and other Nonsteroidal anti-inflammatory medications as these will make you more likely to bleed and can cause stomach ulcers, can also cause Kidney problems.   2)Increase Metformin to 1000 mg twice daily  3)Take the antibiotic Zyvox (Linezolid) 600 mg twice a day for 1 full month due to MRSA bacterial infection in your blood  4)You will Need CBC and BMP blood tests every Monday for the next 5 weeks starting April 26, 2020  5) please change Lantus/glargine insulin injection to 24 units daily  6)Follow UP--PCP for weekly CBC and BMP test for the next 5 weeks while on linezolid/Zyvox  Admission Diagnosis  Hypernatremia [E87.0] Hyperglycemia [R73.9] Acute encephalopathy [U13.14] Acute metabolic encephalopathy [H88.87] Bacteremia [R78.81]   Discharge Diagnosis  Hypernatremia [E87.0] Hyperglycemia [R73.9] Acute encephalopathy [N79.72] Acute metabolic encephalopathy [Q20.60] Bacteremia [R78.81]    Principal Problem:   Sepsis due to methicillin resistant Staphylococcus aureus (MRSA) ([place]) Active Problems:   Acute metabolic encephalopathy   COPD (chronic obstructive pulmonary disease) ([name])   Hypertension   Pulmonary fibrosis ([place])   Hypercholesterolemia   DM type 2 causing vascular disease ([place])   Chronic diastolic CHF (congestive heart failure) ([name])   AF (paroxysmal atrial fibrillation) ([name])   Normocytic anemia   MRSA bacteremia   Acute kidney injury ([place])      Past Medical History:  Diagnosis Date  . Allergy     Rhinitis  . Atrial fibrillation ([place])   . Bronchitis   . Chronic respiratory failure ([place])   . Colon polyps   . COPD (chronic obstructive pulmonary disease) ([place])   . Diabetes mellitus   . Elevated lipids   . Hypercholesterolemia   . Hypertension   . Iron deficiency anemia due to chronic blood loss 04/30/2018  . Noncompliance   . On home O2    2L N/C   . PSA elevation   . Pulmonary fibrosis ([place])   . Vitamin D deficiency     Past Surgical History:  Procedure Laterality Date  . BIOPSY  04/23/2018   Procedure: BIOPSY;  Surgeon: Danie Binder, MD;  Location: [facility];  Service: Endoscopy;;  duodenum gastric  . CATARACT EXTRACTION W/PHACO  06/25/2012   Procedure: CATARACT EXTRACTION PHACO AND INTRAOCULAR LENS PLACEMENT (IOC);  Surgeon: Elta Guadeloupe T. Gershon Crane, MD;  Location: [facility]S;  Service: Ophthalmology;  Laterality: Left;  CDE=19.01  . CATARACT EXTRACTION W/PHACO  07/09/2012   Procedure: CATARACT EXTRACTION PHACO AND INTRAOCULAR LENS PLACEMENT (IOC);  Surgeon: Elta Guadeloupe T. Gershon Crane, MD;  Location: [facility];  Service: Ophthalmology;  Laterality: Right;  CDE: 20.09  . COLONOSCOPY WITH PROPOFOL N/A 04/23/2018   Procedure: COLONOSCOPY WITH PROPOFOL;  Surgeon: Danie Binder, MD;  Location: [facility];  Service: Endoscopy;  Laterality: N/A;  . ESOPHAGOGASTRODUODENOSCOPY (EGD) WITH PROPOFOL N/A 04/23/2018   Procedure: ESOPHAGOGASTRODUODENOSCOPY (EGD) WITH PROPOFOL;  Surgeon: Danie Binder, MD;  Location: [facility];  Service: Endoscopy;  Laterality: N/A;     HPI  from the history and physical done on the day of admission:    Edward [name]  is a 85 y.o. male  history significant for  PAF (atrial fibrillation),pulmonary fibrosis,COPDon home oxygen,type IIdiabetes mellitus, hyperlipidemia, hypertension, iron deficiency anemia,history of noncompliance, PSA elevation,vitamin D ongoing hypoglycemia on documentable encephalopathy on 04/14/2020  -Patient is very confused and a poor  historian -Patient son tells me that patient's blood sugar readings have been reading high which means over 600 for the last couple of days -Patient's wife tells me that patient has been refusing to take insulin for the last couple days -In the ED his bicarb is 28 anion gap is 18 glucose over 500 -Patient received IV fluids and anion gap is down to 14--he remains confused and disoriented -Apparently no vomiting no diarrhea no fevers no chest pain     Hospital Course:     Brief Summary:- 85 y.o.malehistory significant forPAF (atrial fibrillation),pulmonary fibrosis,COPDon home oxygen,type IIdiabetes mellitus, hyperlipidemia, hypertension, iron deficiency anemia,history of noncompliance, PSA elevation,vitamin D admitted on 04/14/2020 with severe hyperglycemia with dehydration and AKI resulting in metabolic encephalopathy and found to have MRSA bacteremia  -  A/p 1)MRSA bacteremia--treated with IV vancomycin started on 04/16/2020 -Echo is suboptimal quality due to poor cooperation from patient, valves are not well visualized, EF is 60 to 65% (No definite vegetations were noted on TTE but he does have calcific thickening of the mitral and aortic valves and the TTE was of relatively poor quality) --Family declined TEE, family also reluctant to have PICC line for IV vancomycin due to concerns that patient's confusional episodes may lead him to pull out lines - dc home on p.o. Zyvox for 1 month -Infectious disease consult/input from Dr. Michel Bickers appreciated WBC 6.9 Microbiology results: 7/16BCx2:NGTD 7/16UCx: -Neg 7/15 BC x2: BCID= MRSA in 1 of 2 bottles -- Antimicrobials this admission: vancomycin7/16>> ceftriaxone 7/16>>7/16 -Discharge home on Zyvox/linezolid 600 mg twice daily for 1 month, get CBC and BMP every Monday for 5 weeks -  2)acute metabolic encephalopathy in the setting of severe hyperglycemia, dehydration and AKI and MRSA bacteremia--patient was  hydrated IV until oral intake improved,  -UA not suggestive of UTI Leukocytosis white count down to 6.9 from 15.7   --Appears to be back to baseline   2)chronic hypoxic respiratory failure--continue supplemental oxygen- -- ABG w/o hypercapnia   -Clinically and radiologically no pneumonia -Continue home O2 at 2 L/min  3)AKI--due to dehydration and hyperglycemia, baseline creatinine is usually between 0.9 and 1, creatinine is 1.56 -Creatinine is down to 1.14 , renally adjust medications, avoid nephrotoxic agents / dehydration / hypotension Decrease Lasix to 20 mg daily okay to restart Metformin  4)PAFIb--stable, continue metoprolol for rate control, continue Eliquis for stroke prophylaxis  5)COPD-no acute exacerbation at this time, continue bronchodilators,   6)uncontrolled DM--A1c 8.9,  PTA patient was on Lantus 30 units daily  --During this hospital stay patient was on Lantus insulin  20 units along with mealtime insulin and additional sliding scale as needed -Patient has difficulty following complex instructions -In an attempt to simplify patient's regimen we will discharge on Lantus 24 units daily without any sliding scale or short-acting mealtime insulin -Metformin 1 g twice daily as ordered  7)HyperNatremia- due to  free water deficit setting of normal saline infusions, -Resolved with improvement in oral intake -Sodium is down to 142 from 156  8)Sepsis discussion----on admission patient met SIRS criteria, 2 SIRS criteria: HR 90's to 100' & a WBC of 12.7, patient had AKI and acute metabolic encephalopathy --Blood cultures later grew MRSA in 1 of 2 bottles raising strong possibility of MRSA bacteremia and sepsis  -On admission initially AKI--was deemed to be due to  dehydration in the setting of poor oral intake (confused patient)  and severe hyperglycemia --On admission acute metabolic encephalopathy was initially deemed to be secondary to severe hyperglycemia,  dehydration and AKI -On admission leukocytosis was deemed to be possibly reactive due to severe hyperglycemia dehydration and AKI ----In retrospect patient met sepsis criteria on admission -Patient has MRSA bacteremia with concern for possible endocarditis  Disposition--discharge home with home health RN, home health PT and home health social worker to help improve compliance with medications and diet -Hopefully we can reduce patient's readmission rate  -Dispo: The patient is from:Home Anticipated d/c is to:  home with [facility]  Code Status : Full  Family Communication:    Discussed with patient's wife and son at bedside Consults  :  na DVT Prophylaxis  : Eliquis  Discharge Condition:stable  Follow UP--PCP for weekly CBC and BMP test for the next 5 weeks while on linezolid/Zyvox   Follow-up Information    Susy Frizzle, MD. Schedule an appointment as soon as possible for a visit in 6 day(s).   Specialty: Family Medicine Why: Darliss Ridgel 26, 2021, @ 12:15pm, appointment with Dr. Dennard Schaumann.  Patient needs CBC and BMP test every Monday for the next 5 weeks Contact information: [address] [place] [address] [phone]              Consults obtained -   Diet and Activity recommendation:  As advised  Discharge Instructions    Discharge Instructions    Call MD for:  difficulty breathing, headache or visual disturbances   Complete by: As directed    Call MD for:  extreme fatigue   Complete by: As directed    Call MD for:  persistant dizziness or light-headedness   Complete by: As directed    Call MD for:  persistant nausea and vomiting   Complete by: As directed    Call MD for:  severe uncontrolled pain   Complete by: As directed    Call MD for:  temperature >100.4   Complete by: As directed    Diet - low sodium heart healthy   Complete by: As directed    Diet Carb Modified   Complete by: As directed    Discharge  instructions   Complete by: As directed    1)Avoid ibuprofen/Advil/Aleve/Motrin/Goody Powders/Naproxen/BC powders/Meloxicam/Diclofenac/Indomethacin and other Nonsteroidal anti-inflammatory medications as these will make you more likely to bleed and can cause stomach ulcers, can also cause Kidney problems.   2)Increase Metformin to 1000 mg twice daily  3)Take the antibiotic Zyvox (Linezolid) 600 mg twice a day for 1 full month due to MRSA bacterial infection in your blood  4)You will Need CBC and BMP blood tests every Monday for the next 5 weeks starting April 26, 2020  5) please change Lantus/glargine insulin injection to 24 units daily   Increase activity slowly   Complete by: As directed         Discharge Medications     Allergies as of 04/20/2020  Reactions   Ace Inhibitors Other (See Comments)   Hyperkalemia--07/23/2013:patient states not familiar with the following allergy      Medication List    TAKE these medications   acetaminophen 500 MG tablet Commonly known as: TYLENOL Take 500 mg by mouth every 6 (six) hours as needed for headache.   albuterol (2.5 MG/3ML) 0.083% nebulizer solution Commonly known as: PROVENTIL INHALE 1 VIAL VIA NEBULIZER EVERY 6 HOURS AS NEEDED FOR WHEEZING OR SHORTNESS OF BREATH   Basaglar KwikPen 100 UNIT/ML Inject 0.24 mLs (24 Units total) into the skin at bedtime. What changed: how much to take   budesonide-formoterol 160-4.5 MCG/ACT inhaler Commonly known as: SYMBICORT Inhale 2 puffs into the lungs 2 (two) times daily.   cloNIDine 0.1 MG tablet Commonly known as: CATAPRES Take 1 tablet (0.1 mg total) by mouth daily.   Eliquis 5 MG Tabs tablet Generic drug: apixaban Take 1 tablet by mouth twice daily   furosemide 20 MG tablet Commonly known as: LASIX Take 1 tablet (20 mg total) by mouth daily. What changed:   medication strength  how much to take   ipratropium-albuterol 0.5-2.5 (3) MG/3ML Soln Commonly known as:  DUONEB Take 3 mLs by nebulization every 6 (six) hours as needed.   linezolid 600 MG tablet Commonly known as: Zyvox Take 1 tablet (600 mg total) by mouth 2 (two) times daily.   metFORMIN 1000 MG tablet Commonly known as: GLUCOPHAGE Take 1 tablet (1,000 mg total) by mouth 2 (two) times daily with a meal. What changed: how much to take   metoprolol tartrate 50 MG tablet Commonly known as: LOPRESSOR Take 1 tablet (50 mg total) by mouth 2 (two) times daily. What changed:   medication strength  how much to take   OXYGEN Inhale 3 L into the lungs continuous.   potassium chloride SA 20 MEQ tablet Commonly known as: KLOR-CON Take 1 tablet (20 mEq total) by mouth daily. What changed: when to take this   pravastatin 80 MG tablet Commonly known as: PRAVACHOL TAKE 1 TABLET BY MOUTH AT BEDTIME   pregabalin 100 MG capsule Commonly known as: Lyrica Take 1 capsule (100 mg total) by mouth 2 (two) times daily.       Major procedures and Radiology Reports - PLEASE review detailed and final reports for all details, in brief -   CT Head Wo Contrast  Result Date: 03/23/2020 CLINICAL DATA:  85 year old male with neurologic deficit. EXAM: CT HEAD WITHOUT CONTRAST TECHNIQUE: Contiguous axial images were obtained from the base of the skull through the vertex without intravenous contrast. COMPARISON:  Head CT dated 01/19/2020. FINDINGS: Brain: Mild age-related atrophy and chronic microvascular ischemic changes. There is no acute intracranial hemorrhage. No mass effect or midline shift. No extra-axial fluid collection. Vascular: No hyperdense vessel or unexpected calcification. Skull: Normal. Negative for fracture or focal lesion. Sinuses/Orbits: Mild mucoperiosteal thickening of paranasal sinuses. No air-fluid level. The mastoid air cells are clear. Other: None IMPRESSION: 1. No acute intracranial pathology. 2. Mild age-related atrophy and chronic microvascular ischemic changes. Electronically  Signed   By: Anner Crete M.D.   On: 03/23/2020 19:31   DG Chest Port 1 View  Result Date: 04/14/2020 CLINICAL DATA:  Shortness of breath EXAM: PORTABLE CHEST 1 VIEW COMPARISON:  03/23/2020 FINDINGS: Mild interstitial prominence throughout the lungs, likely chronic. Heart and mediastinal contours are within normal limits. No focal opacities or effusions. No acute bony abnormality. IMPRESSION: No active disease. Electronically Signed   By: Rolm Baptise  M.D.   On: 04/14/2020 09:57   DG Chest Port 1 View  Result Date: 03/23/2020 CLINICAL DATA:  85 year old male with wheezing. EXAM: PORTABLE CHEST 1 VIEW COMPARISON:  Chest radiograph dated 01/19/2020. FINDINGS: Mild diffuse interstitial prominence and nodularity, chronic. No new consolidative changes. There is no pleural effusion pneumothorax. Stable cardiac silhouette. Atherosclerotic calcification of the aorta. No acute osseous pathology. IMPRESSION: No interval change.  No new consolidation. Electronically Signed   By: Anner Crete M.D.   On: 03/23/2020 18:49   ECHOCARDIOGRAM COMPLETE  Result Date: 04/17/2020    ECHOCARDIOGRAM REPORT   Patient Name:   EDEM TIEGS Dunbar Date of Exam: 04/17/2020 Medical Rec #:  [id number]      Height:       65.0 in Accession #:    [id number]     Weight:       170.0 lb Date of Birth:  July 02, 1935      BSA:          1.846 m Patient Age:    22 years       BP:           151/80 mmHg Patient Gender: M              HR:           97 bpm. Exam Location:  Inpatient Procedure: 2D Echo Indications:    790.7 bacteremia  History:        Patient has prior history of Echocardiogram examinations, most                 recent 09/23/2017. COPD, Arrythmias:Atrial Fibrillation; Risk                 Factors:Diabetes, Hypertension, Dyslipidemia and Former Smoker.                 Chronic respiratory failure.  Sonographer:    Jannett Celestine RDCS (AE) Referring Phys: [id number] [facility]  Sonographer Comments: Image acquisition challenging due to  uncooperative patient and Image acquisition challenging due to respiratory motion. restricted mobility. patient had difficulty remaining still & was coughing often. unable to follow breathing instructions properly IMPRESSIONS  1. Left ventricular ejection fraction, by estimation, is 65 to 70%. The left ventricle has normal function. Left ventricular endocardial border not optimally defined to evaluate regional wall motion. Left ventricular diastolic parameters are indeterminate.  2. Right ventricular systolic function is normal. The right ventricular size is normal.  3. The mitral valve is abnormal. Trivial mitral valve regurgitation.  4. AV is thickened, calcified There appears on 2 D to have restricted motion. Images are difficult/poor. No gradient through the valve. Compared report form december 2018, AV appeared mildly narrowed at that time     Would recomm limited echo in future to complete assessment of valve when patient can be brought down to echo suite for nonportable exam.. The aortic valve was not well visualized. Aortic valve regurgitation is not visualized.  5. The inferior vena cava is normal in size with greater than 50% respiratory variability, suggesting right atrial pressure of 3 mmHg. FINDINGS  Left Ventricle: Left ventricular ejection fraction, by estimation, is 65 to 70%. The left ventricle has normal function. Left ventricular endocardial border not optimally defined to evaluate regional wall motion. The left ventricular internal cavity size was small. There is no left ventricular hypertrophy. Left ventricular diastolic parameters are indeterminate. Right Ventricle: The right ventricular size is normal. Right vetricular wall thickness was not assessed. Right ventricular  systolic function is normal. Left Atrium: Left atrial size was normal in size. Right Atrium: Right atrial size was normal in size. Pericardium: There is no evidence of pericardial effusion. Mitral Valve: The mitral valve is  abnormal. There is mild thickening of the mitral valve leaflet(s). Mild mitral annular calcification. Trivial mitral valve regurgitation. Tricuspid Valve: The tricuspid valve is grossly normal. Tricuspid valve regurgitation is trivial. Aortic Valve: AV is thickened, calcified There appears on 2 D to have restricted motion. Images are difficult/poor. No gradient through the valve. Compared report form december 2018, AV appeared mildly narrowed at that time Would recomm limited echo in future to complete assessment of valve when patient can be brought down to echo suite for nonportable exam. The aortic valve was not well visualized. Aortic valve regurgitation is not visualized. Pulmonic Valve: The pulmonic valve was not well visualized. Pulmonic valve regurgitation is not visualized. Aorta: The aortic root is normal in size and structure. Venous: The inferior vena cava is normal in size with greater than 50% respiratory variability, suggesting right atrial pressure of 3 mmHg. IAS/Shunts: The interatrial septum was not assessed.  LEFT VENTRICLE PLAX 2D LVIDd:         3.07 cm  Diastology LVIDs:         2.13 cm  LV e' lateral:   9.79 cm/s LV PW:         1.12 cm  LV E/e' lateral: 6.1 LV IVS:        1.19 cm  LV e' medial:    4.24 cm/s LVOT diam:     2.00 cm  LV E/e' medial:  14.2 LV SV:         35 LV SV Index:   19 LVOT Area:     3.14 cm  LEFT ATRIUM           Index       RIGHT ATRIUM           Index LA diam:      3.40 cm 1.84 cm/m  RA Area:     10.40 cm LA Vol (A4C): 24.5 ml 13.27 ml/m RA Volume:   18.50 ml  10.02 ml/m  AORTIC VALVE LVOT Vmax:   74.40 cm/s LVOT Vmean:  57.100 cm/s LVOT VTI:    0.111 m  AORTA Ao Root diam: 3.30 cm MITRAL VALVE MV Area (PHT): 2.63 cm    SHUNTS MV Decel Time: 288 msec    Systemic VTI:  0.11 m MV E velocity: 60.00 cm/s  Systemic Diam: 2.00 cm MV A velocity: 71.10 cm/s MV E/A ratio:  0.84 MV A Prime:    10.0 cm/s Dietrich Pates MD Electronically signed by Dietrich Pates MD Signature Date/Time:  04/17/2020/10:28:46 AM    Final     Micro Results    Recent Results (from the past 240 hour(s))  SARS Coronavirus 2 by RT PCR (hospital order, performed in [facility] hospital lab) Nasopharyngeal Nasopharyngeal Swab     Status: None   Collection Time: 04/14/20 10:31 AM   Specimen: Nasopharyngeal Swab  Result Value Ref Range Status   SARS Coronavirus 2 NEGATIVE NEGATIVE Final    Comment: (NOTE) SARS-CoV-2 target nucleic acids are NOT DETECTED.  The SARS-CoV-2 RNA is generally detectable in upper and lower respiratory specimens during the acute phase of infection. The lowest concentration of SARS-CoV-2 viral copies this assay can detect is 250 copies / mL. A negative result does not preclude SARS-CoV-2 infection and should not be used as  the sole basis for treatment or other patient management decisions.  A negative result may occur with improper specimen collection / handling, submission of specimen other than nasopharyngeal swab, presence of viral mutation(s) within the areas targeted by this assay, and inadequate number of viral copies (<250 copies / mL). A negative result must be combined with clinical observations, patient history, and epidemiological information.  Fact Sheet for Patients:   StrictlyIdeas.no  Fact Sheet for Healthcare Providers: BankingDealers.co.za  This test is not yet approved or  cleared by the [place] FDA and has been authorized for detection and/or diagnosis of SARS-CoV-2 by FDA under an Emergency Use Authorization (EUA).  This EUA will remain in effect (meaning this test can be used) for the duration of the COVID-19 declaration under Section 564(b)(1) of the Act, 21 U.S.C. section 360bbb-3(b)(1), unless the authorization is terminated or revoked sooner.  Performed at [facility], [address], [place], [place] [address]   Culture, blood (Routine X 2) w Reflex to ID Panel     Status: Abnormal    Collection Time: 04/15/20  8:34 AM   Specimen: BLOOD RIGHT HAND  Result Value Ref Range Status   Specimen Description   Final    BLOOD RIGHT HAND Performed at [facility], [address] [place], [place] [address]    Special Requests   Final    BOTTLES DRAWN AEROBIC AND ANAEROBIC Blood Culture adequate volume Performed at [facility], [address], [place], [place] [address]    Culture  Setup Time   Final    GRAM POSITIVE COCCI IN CLUSTERS CRITICAL RESULT CALLED TO, READ BACK BY AND VERIFIED WITH: Rhew,J at [place] on 04/16/20 by Huffines,S. IN BOTH AEROBIC AND ANAEROBIC BOTTLES Organism ID to follow CRITICAL RESULT CALLED TO, READ BACK BY AND VERIFIED WITH: Cloyde Reams [id number] MLM    Culture METHICILLIN RESISTANT STAPHYLOCOCCUS AUREUS (A)  Final   Report Status 04/18/2020 FINAL  Final   Organism ID, Bacteria METHICILLIN RESISTANT STAPHYLOCOCCUS AUREUS  Final      Susceptibility   Methicillin resistant staphylococcus aureus - MIC*    CIPROFLOXACIN >=8 RESISTANT Resistant     ERYTHROMYCIN >=8 RESISTANT Resistant     GENTAMICIN <=0.5 SENSITIVE Sensitive     OXACILLIN >=4 RESISTANT Resistant     TETRACYCLINE <=1 SENSITIVE Sensitive     VANCOMYCIN <=0.5 SENSITIVE Sensitive     TRIMETH/SULFA <=10 SENSITIVE Sensitive     CLINDAMYCIN <=0.25 SENSITIVE Sensitive     RIFAMPIN <=0.5 SENSITIVE Sensitive     Inducible Clindamycin NEGATIVE Sensitive     * METHICILLIN RESISTANT STAPHYLOCOCCUS AUREUS  Culture, blood (Routine X 2) w Reflex to ID Panel     Status: None   Collection Time: 04/15/20  8:34 AM   Specimen: Left Antecubital; Blood  Result Value Ref Range Status   Specimen Description LEFT ANTECUBITAL BLOOD  Final   Special Requests   Final    BOTTLES DRAWN AEROBIC AND ANAEROBIC Blood Culture adequate volume   Culture   Final    NO GROWTH 5 DAYS Performed at [facility], [address] [place], [place] [address]    Report Status 04/20/2020 FINAL  Final  Blood Culture ID  Panel (Reflexed)     Status: Abnormal   Collection Time: 04/15/20  8:34 AM  Result Value Ref Range Status   Enterococcus species NOT DETECTED NOT DETECTED Final   Listeria monocytogenes NOT DETECTED NOT DETECTED Final   Staphylococcus species DETECTED (A) NOT DETECTED  Final    Comment: CRITICAL RESULT CALLED TO, READ BACK BY AND VERIFIED WITH: PHARMD S HURTH [id number] MLM    Staphylococcus aureus (BCID) DETECTED (A) NOT DETECTED Final    Comment: Methicillin (oxacillin)-resistant Staphylococcus aureus (MRSA). MRSA is predictably resistant to beta-lactam antibiotics (except ceftaroline). Preferred therapy is vancomycin unless clinically contraindicated. Patient requires contact precautions if  hospitalized. CRITICAL RESULT CALLED TO, READ BACK BY AND VERIFIED WITH: PHARMD S HURTH [id number] MLM    Methicillin resistance DETECTED (A) NOT DETECTED Final    Comment: CRITICAL RESULT CALLED TO, READ BACK BY AND VERIFIED WITH: PHARMD S HURTH [id number] MLM    Streptococcus species NOT DETECTED NOT DETECTED Final   Streptococcus agalactiae NOT DETECTED NOT DETECTED Final   Streptococcus pneumoniae NOT DETECTED NOT DETECTED Final   Streptococcus pyogenes NOT DETECTED NOT DETECTED Final   Acinetobacter baumannii NOT DETECTED NOT DETECTED Final   Enterobacteriaceae species NOT DETECTED NOT DETECTED Final   Enterobacter cloacae complex NOT DETECTED NOT DETECTED Final   Escherichia coli NOT DETECTED NOT DETECTED Final   Klebsiella oxytoca NOT DETECTED NOT DETECTED Final   Klebsiella pneumoniae NOT DETECTED NOT DETECTED Final   Proteus species NOT DETECTED NOT DETECTED Final   Serratia marcescens NOT DETECTED NOT DETECTED Final   Haemophilus influenzae NOT DETECTED NOT DETECTED Final   Neisseria meningitidis NOT DETECTED NOT DETECTED Final   Pseudomonas aeruginosa NOT DETECTED NOT DETECTED Final   Candida albicans NOT DETECTED NOT DETECTED Final   Candida glabrata NOT DETECTED NOT DETECTED  Final   Candida krusei NOT DETECTED NOT DETECTED Final   Candida parapsilosis NOT DETECTED NOT DETECTED Final   Candida tropicalis NOT DETECTED NOT DETECTED Final    Comment: Performed at [facility], [place]. [address], [place], [place] [address]  Urine Culture     Status: Abnormal   Collection Time: 04/16/20  4:21 AM   Specimen: Urine, Catheterized  Result Value Ref Range Status   Specimen Description   Final    URINE, CATHETERIZED Performed at [facility], [address] [place], [place] [address]    Special Requests   Final    Normal Performed at [facility], [address], [place], [place] [address]    Culture MULTIPLE SPECIES PRESENT, SUGGEST RECOLLECTION (A)  Final   Report Status 04/17/2020 FINAL  Final  Culture, blood (Routine X 2) w Reflex to ID Panel     Status: None (Preliminary result)   Collection Time: 04/16/20  8:48 AM   Specimen: BLOOD RIGHT ARM  Result Value Ref Range Status   Specimen Description BLOOD RIGHT ARM  Final   Special Requests   Final    BOTTLES DRAWN AEROBIC AND ANAEROBIC Blood Culture adequate volume   Culture   Final    NO GROWTH 4 DAYS Performed at [facility], [address], [place], [place] [address]    Report Status PENDING  Incomplete  Culture, blood (Routine X 2) w Reflex to ID Panel     Status: None (Preliminary result)   Collection Time: 04/16/20  8:56 AM   Specimen: BLOOD RIGHT HAND  Result Value Ref Range Status   Specimen Description BLOOD RIGHT HAND  Final   Special Requests   Final    BOTTLES DRAWN AEROBIC AND ANAEROBIC Blood Culture adequate volume   Culture   Final    NO GROWTH 4 DAYS Performed at [facility], [address] [place], [place] [address]    Report Status PENDING  Incomplete   Today   Subjective    Lauris Lamantia today has no new complaints -Resting comfortably -Watching [employer] on TV going to orbit/space     -Mostly appropriate with his answers to questions today      Patient has been seen and  examined prior to discharge   Objective   Blood pressure (!) 147/70, pulse 87, temperature 98 F (36.7 C), temperature source Oral, resp. rate 20, weight 75.1 kg, SpO2 97 %.  Intake/Output Summary (Last 24 hours) at 04/20/2020 1703 Last data filed at 04/20/2020 1234 Gross per 24 hour  Intake 6 ml  Output --  Net 6 ml   Exam Gen:- Awake Alert, no acute distress  HEENT:- [place].AT, No sclera icterus Neck-Supple Neck,No JVD,.  Lungs-  CTAB , good air movement bilaterally  CV- S1, S2 normal, regular Abd-  +ve B.Sounds, Abd Soft, No tenderness,    Extremity/Skin:- No  edema,   good pulses Psych-affect is appropriate, oriented x3 Neuro-generalized weakness, no new focal deficits, no tremors    Data Review   CBC w Diff:  Lab Results  Component Value Date   WBC 6.9 04/19/2020   HGB 11.0 (L) 04/19/2020   HCT 35.2 (L) 04/19/2020   PLT 317 04/19/2020   LYMPHOPCT 9 04/14/2020   MONOPCT 7 04/14/2020   EOSPCT 0 04/14/2020   BASOPCT 0 04/14/2020    CMP:  Lab Results  Component Value Date   NA 142 04/20/2020   K 3.6 04/20/2020   CL 111 04/20/2020   CO2 22 04/20/2020   BUN 31 (H) 04/20/2020   CREATININE 1.14 04/20/2020   CREATININE 0.88 01/23/2020   PROT 7.3 04/16/2020   ALBUMIN 3.3 (L) 04/16/2020   BILITOT 0.5 04/16/2020   ALKPHOS 76 04/16/2020   AST 20 04/16/2020   ALT 13 04/16/2020  . Total Discharge time is about 33 minutes  Roxan Hockey M.D on 04/20/2020 at 5:03 PM  Go to [email or web address] -  for contact info  [facility] - Office  [phone]

## 2020-04-21 LAB — CULTURE, BLOOD (ROUTINE X 2)
Culture: NO GROWTH
Culture: NO GROWTH
Special Requests: ADEQUATE
Special Requests: ADEQUATE

## 2020-04-22 ENCOUNTER — Other Ambulatory Visit: Payer: Self-pay | Admitting: *Deleted

## 2020-04-22 NOTE — Patient Outreach (Signed)
[facility]) Care Management  04/22/2020  Edward [name] 1935-06-24 [id number]   Telephone Assessment-D/C 7/20 Transition of care to be completed by the provider   Pt discharged on 7/20 with [facility] for RN/PT services. RN attempted outreach call today however unsuccessful. RN unable to leave a message however pt will be followed up next week with ongoing case management services.  Plan: Will report to coverage Roderick Pee) to follow up next week with another outreach to inquire on possible needs at that time.  Raina Mina, RN Care Management Coordinator [place] Office [phone]

## 2020-04-24 DIAGNOSIS — I5033 Acute on chronic diastolic (congestive) heart failure: Secondary | ICD-10-CM | POA: Diagnosis not present

## 2020-04-24 DIAGNOSIS — E1122 Type 2 diabetes mellitus with diabetic chronic kidney disease: Secondary | ICD-10-CM | POA: Diagnosis not present

## 2020-04-24 DIAGNOSIS — I0981 Rheumatic heart failure: Secondary | ICD-10-CM | POA: Diagnosis not present

## 2020-04-24 DIAGNOSIS — I130 Hypertensive heart and chronic kidney disease with heart failure and stage 1 through stage 4 chronic kidney disease, or unspecified chronic kidney disease: Secondary | ICD-10-CM | POA: Diagnosis not present

## 2020-04-24 DIAGNOSIS — N183 Chronic kidney disease, stage 3 unspecified: Secondary | ICD-10-CM | POA: Diagnosis not present

## 2020-04-26 ENCOUNTER — Other Ambulatory Visit: Payer: Self-pay

## 2020-04-26 ENCOUNTER — Other Ambulatory Visit: Payer: Self-pay | Admitting: Family Medicine

## 2020-04-26 ENCOUNTER — Ambulatory Visit (INDEPENDENT_AMBULATORY_CARE_PROVIDER_SITE_OTHER): Payer: Medicare Other | Admitting: Family Medicine

## 2020-04-26 ENCOUNTER — Encounter: Payer: Self-pay | Admitting: Family Medicine

## 2020-04-26 VITALS — BP 114/66 | HR 74 | Temp 97.2°F | Ht 65.0 in | Wt 161.0 lb

## 2020-04-26 DIAGNOSIS — N179 Acute kidney failure, unspecified: Secondary | ICD-10-CM | POA: Diagnosis not present

## 2020-04-26 DIAGNOSIS — I480 Paroxysmal atrial fibrillation: Secondary | ICD-10-CM

## 2020-04-26 DIAGNOSIS — B9562 Methicillin resistant Staphylococcus aureus infection as the cause of diseases classified elsewhere: Secondary | ICD-10-CM

## 2020-04-26 DIAGNOSIS — E1165 Type 2 diabetes mellitus with hyperglycemia: Secondary | ICD-10-CM

## 2020-04-26 DIAGNOSIS — A4102 Sepsis due to Methicillin resistant Staphylococcus aureus: Secondary | ICD-10-CM

## 2020-04-26 DIAGNOSIS — R7881 Bacteremia: Secondary | ICD-10-CM

## 2020-04-26 LAB — COMPLETE METABOLIC PANEL WITH GFR
AG Ratio: 1.2 (calc) (ref 1.0–2.5)
ALT: 10 U/L (ref 9–46)
AST: 16 U/L (ref 10–35)
Albumin: 3.5 g/dL — ABNORMAL LOW (ref 3.6–5.1)
Alkaline phosphatase (APISO): 71 U/L (ref 35–144)
BUN/Creatinine Ratio: 20 (calc) (ref 6–22)
BUN: 29 mg/dL — ABNORMAL HIGH (ref 7–25)
CO2: 27 mmol/L (ref 20–32)
Calcium: 9 mg/dL (ref 8.6–10.3)
Chloride: 96 mmol/L — ABNORMAL LOW (ref 98–110)
Creat: 1.42 mg/dL — ABNORMAL HIGH (ref 0.70–1.11)
GFR, Est African American: 53 mL/min/{1.73_m2} — ABNORMAL LOW (ref >=60)
GFR, Est Non African American: 46 mL/min/{1.73_m2} — ABNORMAL LOW (ref >=60)
Globulin: 2.9 g/dL (calc) (ref 1.9–3.7)
Glucose, Bld: 103 mg/dL — ABNORMAL HIGH (ref 65–99)
Potassium: 4.7 mmol/L (ref 3.5–5.3)
Sodium: 135 mmol/L (ref 135–146)
Total Bilirubin: 0.3 mg/dL (ref 0.2–1.2)
Total Protein: 6.4 g/dL (ref 6.1–8.1)

## 2020-04-26 LAB — CBC WITH DIFFERENTIAL/PLATELET
Absolute Monocytes: 706 cells/uL (ref 200–950)
Basophils Absolute: 43 cells/uL (ref 0–200)
Basophils Relative: 0.5 %
Eosinophils Absolute: 281 cells/uL (ref 15–500)
Eosinophils Relative: 3.3 %
HCT: 34.4 % — ABNORMAL LOW (ref 38.5–50.0)
Hemoglobin: 11 g/dL — ABNORMAL LOW (ref 13.2–17.1)
Lymphs Abs: 2040 cells/uL (ref 850–3900)
MCH: 28.7 pg (ref 27.0–33.0)
MCHC: 32 g/dL (ref 32.0–36.0)
MCV: 89.8 fL (ref 80.0–100.0)
MPV: 11.4 fL (ref 7.5–12.5)
Monocytes Relative: 8.3 %
Neutro Abs: 5432 cells/uL (ref 1500–7800)
Neutrophils Relative %: 63.9 %
Platelets: 387 10*3/uL (ref 140–400)
RBC: 3.83 10*6/uL — ABNORMAL LOW (ref 4.20–5.80)
RDW: 13.5 % (ref 11.0–15.0)
Total Lymphocyte: 24 %
WBC: 8.5 10*3/uL (ref 3.8–10.8)

## 2020-04-26 NOTE — Progress Notes (Signed)
Subjective:    Patient ID: Edward [name], male    DOB: 06/17/1935, 85 y.o.   MRN: [id number]  HPI Admission date:  04/14/2020  Admitting Physician  [name], MD  Discharge Date:  04/20/2020   Primary MD  Susy Frizzle, MD  Recommendations for primary care physician for things to follow:   1)Avoid ibuprofen/Advil/Aleve/Motrin/Goody Powders/Naproxen/BC powders/Meloxicam/Diclofenac/Indomethacin and other Nonsteroidal anti-inflammatory medications as these will make you more likely to bleed and can cause stomach ulcers, can also cause Kidney problems.   2)Increase Metformin to 1000 mg twice daily  3)Take the antibiotic Zyvox (Linezolid) 600 mg twice a day for 1 full month due to MRSA bacterial infection in your blood  4)You will Need CBC and BMP blood tests every Monday for the next 5 weeks starting April 26, 2020  5) please change Lantus/glargine insulin injection to 24 units daily  6)Follow UP--PCP for weekly CBC and BMP test for the next 5 weeks while on linezolid/Zyvox  Admission Diagnosis  Hypernatremia [E87.0] Hyperglycemia [R73.9] Acute encephalopathy [L27.51] Acute metabolic encephalopathy [Z00.17] Bacteremia [R78.81]   Discharge Diagnosis  Hypernatremia [E87.0] Hyperglycemia [R73.9] Acute encephalopathy [C94.49] Acute metabolic encephalopathy [Q75.91] Bacteremia [R78.81]    Principal Problem:   Sepsis due to methicillin resistant Staphylococcus aureus (MRSA) ([place]) Active Problems:   Acute metabolic encephalopathy   COPD (chronic obstructive pulmonary disease) ([name])   Hypertension   Pulmonary fibrosis ([place])   Hypercholesterolemia   DM type 2 causing vascular disease ([place])   Chronic diastolic CHF (congestive heart failure) ([name])   AF (paroxysmal atrial fibrillation) ([name])   Normocytic anemia   MRSA bacteremia   Acute kidney injury ([place])          Past Medical History:  Diagnosis Date  . Allergy    Rhinitis  . Atrial fibrillation  ([place])   . Bronchitis   . Chronic respiratory failure ([place])   . Colon polyps   . COPD (chronic obstructive pulmonary disease) ([place])   . Diabetes mellitus   . Elevated lipids   . Hypercholesterolemia   . Hypertension   . Iron deficiency anemia due to chronic blood loss 04/30/2018  . Noncompliance   . On home O2    2L N/C   . PSA elevation   . Pulmonary fibrosis ([place])   . Vitamin D deficiency          Past Surgical History:  Procedure Laterality Date  . BIOPSY  04/23/2018   Procedure: BIOPSY;  Surgeon: Danie Binder, MD;  [facility];  Service: Endoscopy;;  duodenum gastric  . CATARACT EXTRACTION W/PHACO  06/25/2012   Procedure: CATARACT EXTRACTION PHACO AND INTRAOCULAR LENS PLACEMENT (IOC);  Surgeon: Elta Guadeloupe T. Gershon Crane, MD;  Location: [facility];  Service: Ophthalmology;  Laterality: Left;  CDE=19.01  . CATARACT EXTRACTION W/PHACO  07/09/2012   Procedure: CATARACT EXTRACTION PHACO AND INTRAOCULAR LENS PLACEMENT (IOC);  Surgeon: Elta Guadeloupe T. Gershon Crane, MD;  Location: [facility];  Service: Ophthalmology;  Laterality: Right;  CDE: 20.09  . COLONOSCOPY WITH PROPOFOL N/A 04/23/2018   Procedure: COLONOSCOPY WITH PROPOFOL;  Surgeon: Danie Binder, MD;  [facility];  Service: Endoscopy;  Laterality: N/A;  . ESOPHAGOGASTRODUODENOSCOPY (EGD) WITH PROPOFOL N/A 04/23/2018   Procedure: ESOPHAGOGASTRODUODENOSCOPY (EGD) WITH PROPOFOL;  Surgeon: Danie Binder, MD;  [facility];  Service: Endoscopy;  Laterality: N/A;     HPI  from the history and physical done on the day of admission:    [name] a84 y.o.malehistory significant  forPAF (atrial fibrillation),pulmonary fibrosis,COPDon home oxygen,type IIdiabetes mellitus, hyperlipidemia, hypertension, iron deficiency anemia,history of noncompliance, PSA elevation,vitamin Dongoing hypoglycemia on documentable encephalopathy on 04/14/2020  -Patient is very confused and a poor  historian -Patient son tells me that patient's blood sugar readings have been reading high which means over 600 for the last couple of days -Patient's wife tells me that patient has been refusing to take insulin for the last couple days -In the ED his bicarb is 28 anion gap is 18 glucose over 500 -Patient received IV fluids and anion gap is down to 14--he remains confused and disoriented -Apparently no vomiting no diarrhea no fevers no chest pain     Hospital Course:     Brief Summary:- 84 y.o.malehistory significant forPAF (atrial fibrillation),pulmonary fibrosis,COPDon home oxygen,type IIdiabetes mellitus, hyperlipidemia, hypertension, iron deficiency anemia,history of noncompliance, PSA elevation,vitamin Dadmitted on 04/14/2020 with severe hyperglycemia with dehydration and AKI resulting in metabolic encephalopathy and found to have MRSA bacteremia  -  A/p 1)MRSA bacteremia--treated with IV vancomycin started on 04/16/2020 -Echo is suboptimal quality due to poor cooperation from patient, valves are not well visualized, EF is 60 to 65% (No definite vegetations were noted on TTE but he does have calcific thickening of the mitral and aortic valves and the TTE was of relatively poor quality) --Family declined TEE, family also reluctant to have PICC line for IV vancomycin due to concerns that patient's confusional episodes may lead him to pull out lines - dc home onp.o. Zyvox for 1 month -Infectious disease consult/input from Dr. Jenny Reichmann Campbellappreciated WBC 6.9 Microbiology results: 7/16BCx2:NGTD 7/16UCx: -Neg 7/15 BC x2: BCID= MRSA in 1 of 2 bottles -- Antimicrobials this admission: vancomycin7/16>> ceftriaxone 7/16>>7/16 -Discharge home on Zyvox/linezolid 600 mg twice daily for 1 month, get CBC and BMP every Monday for 5 weeks -  2)acute metabolic encephalopathy in the setting of severe hyperglycemia, dehydration and AKI and MRSA bacteremia--patient  was hydrated IV until oral intake improved,  -UA not suggestive of UTI Leukocytosis white count down to6.13fom 15.7  --Appears to be back to baseline   2)chronic hypoxic respiratory failure--continue supplemental oxygen- -- ABG w/o hypercapnia  -Clinically and radiologically no pneumonia -Continue home O2 at 2 L/min  3)AKI--due to dehydration and hyperglycemia, baseline creatinine is usually between 0.9 and 1, creatinine is 1.56 -Creatinine is down to 1.14 , renally adjust medications, avoid nephrotoxic agents / dehydration / hypotension Decrease Lasix to 20 mg daily okay to restart Metformin  4)PAFIb--stable, continue metoprolol for rate control, continue Eliquis for stroke prophylaxis  5)COPD-no acute exacerbation at this time, continue bronchodilators,   6)uncontrolled DM--A1c 8.9, PTA patient was on Lantus 30 units daily  --During this hospital stay patient was on Lantus insulin  20 units along with mealtime insulin and additional sliding scale as needed -Patient has difficulty following complex instructions -In an attempt to simplify patient's regimen we will discharge on Lantus 24 units daily without any sliding scale or short-acting mealtime insulin -Metformin 1 g twice daily as ordered  7)HyperNatremia- due to  free water deficit setting of normal saline infusions, -Resolved with improvement in oral intake -Sodium is down to 1434fm 156  8)Sepsis discussion----on admission patient met SIRS criteria, 2 SIRS criteria: HR 90's to 100' & a WBC of 12.7, patient had AKI and acute metabolic encephalopathy --Blood cultures later grew MRSA in 1 of 2 bottles raising strong possibility of MRSA bacteremia and sepsis  -On admission initially AKI--was deemed to be due to  dehydration in the setting of  poor oral intake (confused patient)  and severe hyperglycemia --On admission acute metabolic encephalopathy was initially deemed to be secondary to severe hyperglycemia,  dehydration and AKI -On admission leukocytosis was deemed to be possibly reactive due to severe hyperglycemia dehydration and AKI ----In retrospect patient met sepsis criteria on admission -Patient has MRSA bacteremia with concern for possible endocarditis  Disposition--discharge home with home health RN, home health PT and home health social worker to help improve compliance with medications and diet -Hopefully we can reduce patient's readmission rate   04/26/20 Patient is here today with his son for follow-up.  Compliance has been an issue for this patient.  I asked the patient how many units of insulin he is taking.  His son states that he is taking 24 units.  The patient belatedly states that he is taking 30 units.  Apparently some days the son supervises the insulin.  Other days the father self administers the insulin.  The hospital change the dose.  I definitely believe there is some confusion and potentially missed doses of insulin at home.  I next asked the patient how often he is taking the antibiotic.  Patient is unable to answer that question.  The son states once a day.  He is supposed to be on Zyvox twice daily for 1 month for MRSA bacteremia.  I emphasized this to the patient and the son today and also emphasized the importance of taking this medication.  He denies any fever or chills.  Son denies any confusion.  I next asked the patient how often he is taking his metformin.  He states that he is taking at 1000 mg which is the new dose however he is only taking it once a day.  I emphasized to the patient that the hospital had recommended he take this twice a day to better manage his blood sugars.  I believe that due to confusion, the patient is often not taking his medication correctly and may not be taking it at all.  I explained this to the patient's son.  I believe that the son needs to supervise his father's administration of insulin.  I explained that this would help reduce his risk of  rehospitalization.  Patient denies any cough or shortness of breath or chest pain or dysuria.  His lungs are actually clear to auscultation today.  He does request something for pain.  Given his recent encephalopathy I would not refill his pain medication at this time.  I stated that we will need to depend on Lyrica to help manage his postherpetic neuralgia from now on and avoid opiates. Past Medical History:  Diagnosis Date  . Allergy    Rhinitis  . Atrial fibrillation ([place])   . Bronchitis   . Chronic respiratory failure ([place])   . Colon polyps   . COPD (chronic obstructive pulmonary disease) ([place])   . Diabetes mellitus   . Elevated lipids   . Hypercholesterolemia   . Hypertension   . Iron deficiency anemia due to chronic blood loss 04/30/2018  . Noncompliance   . On home O2    2L N/C   . PSA elevation   . Pulmonary fibrosis ([place])   . Vitamin D deficiency    Past Surgical History:  Procedure Laterality Date  . BIOPSY  04/23/2018   Procedure: BIOPSY;  Surgeon: Danie Binder, MD;  [facility];  Service: Endoscopy;;  duodenum gastric  . CATARACT EXTRACTION W/PHACO  06/25/2012   Procedure: CATARACT EXTRACTION  PHACO AND INTRAOCULAR LENS PLACEMENT (IOC);  Surgeon: Elta Guadeloupe T. Gershon Crane, MD;  Location: [facility];  Service: Ophthalmology;  Laterality: Left;  CDE=19.01  . CATARACT EXTRACTION W/PHACO  07/09/2012   Procedure: CATARACT EXTRACTION PHACO AND INTRAOCULAR LENS PLACEMENT (IOC);  Surgeon: Elta Guadeloupe T. Gershon Crane, MD;  Location: [facility];  Service: Ophthalmology;  Laterality: Right;  CDE: 20.09  . COLONOSCOPY WITH PROPOFOL N/A 04/23/2018   Procedure: COLONOSCOPY WITH PROPOFOL;  Surgeon: Danie Binder, MD;  [facility];  Service: Endoscopy;  Laterality: N/A;  . ESOPHAGOGASTRODUODENOSCOPY (EGD) WITH PROPOFOL N/A 04/23/2018   Procedure: ESOPHAGOGASTRODUODENOSCOPY (EGD) WITH PROPOFOL;  Surgeon: Danie Binder, MD;  [facility];  Service: Endoscopy;  Laterality: N/A;    Current Outpatient Medications on File Prior to Visit  Medication Sig Dispense Refill  . acetaminophen (TYLENOL) 500 MG tablet Take 500 mg by mouth every 6 (six) hours as needed for headache.    . albuterol (PROVENTIL) (2.5 MG/3ML) 0.083% nebulizer solution INHALE 1 VIAL VIA NEBULIZER EVERY 6 HOURS AS NEEDED FOR WHEEZING OR SHORTNESS OF BREATH 360 mL 4  . budesonide-formoterol (SYMBICORT) 160-4.5 MCG/ACT inhaler Inhale 2 puffs into the lungs 2 (two) times daily. 1 Inhaler 3  . cloNIDine (CATAPRES) 0.1 MG tablet Take 1 tablet (0.1 mg total) by mouth daily.    Marland Kitchen ELIQUIS 5 MG TABS tablet Take 1 tablet by mouth twice daily 60 tablet 11  . furosemide (LASIX) 20 MG tablet Take 1 tablet (20 mg total) by mouth daily. 30 tablet 1  . Insulin Glargine (BASAGLAR KWIKPEN) 100 UNIT/ML Inject 0.24 mLs (24 Units total) into the skin at bedtime. 10 pen 3  . ipratropium-albuterol (DUONEB) 0.5-2.5 (3) MG/3ML SOLN Take 3 mLs by nebulization every 6 (six) hours as needed. 360 mL 1  . linezolid (ZYVOX) 600 MG tablet Take 1 tablet (600 mg total) by mouth 2 (two) times daily. 60 tablet 0  . metFORMIN (GLUCOPHAGE) 1000 MG tablet Take 1 tablet (1,000 mg total) by mouth 2 (two) times daily with a meal. (Patient taking differently: Take 500 mg by mouth 2 (two) times daily with a meal. ) 60 tablet 11  . metoprolol tartrate (LOPRESSOR) 50 MG tablet Take 1 tablet (50 mg total) by mouth 2 (two) times daily. 60 tablet 2  . OXYGEN Inhale 3 L into the lungs continuous.     . potassium chloride SA (KLOR-CON) 20 MEQ tablet Take 1 tablet (20 mEq total) by mouth daily. 30 tablet 1  . pravastatin (PRAVACHOL) 80 MG tablet TAKE 1 TABLET BY MOUTH AT BEDTIME 30 tablet 3  . pregabalin (LYRICA) 100 MG capsule Take 1 capsule (100 mg total) by mouth 2 (two) times daily. 60 capsule 1   No current facility-administered medications on file prior to visit.   Allergies  Allergen Reactions  . Ace Inhibitors Other (See Comments)     Hyperkalemia--07/23/2013:patient states not familiar with the following allergy   Social History   Socioeconomic History  . Marital status: Married    Spouse name: Not on file  . Number of children: Not on file  . Years of education: Not on file  . Highest education level: Not on file  Occupational History  . 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 Smoking status: Former Smoker    Packs/day: 1.50    Years: 60.00    Pack years: 90.00    Types: Cigarettes    Quit date: 12/31/2012    Years  since quitting: 7.3  . Smokeless tobacco: Never Used  Vaping Use  . Vaping Use: Never used  Substance and Sexual Activity  . Alcohol use: No  . Drug use: No  . Sexual activity: Yes    Birth control/protection: None  Other Topics Concern  . Not on file  Social History Narrative  . Not on file   Social Determinants of Health   Financial Resource Strain: Low Risk   . Difficulty of Paying Living Expenses: Not very hard  Food Insecurity:   . Worried About Charity fundraiser in the Last Year:   . Arboriculturist in the Last Year:   Transportation Needs: No Transportation Needs  . Lack of Transportation (Medical): No  . Lack of Transportation (Non-Medical): No  Physical Activity: Inactive  . Days of Exercise per Week: 0 days  . Minutes of Exercise per Session: 0 min  Stress: No Stress Concern Present  . Feeling of Stress : Only a little  Social Connections:   . Frequency of Communication with Friends and Family:   . Frequency of Social Gatherings with Friends and Family:   . Attends Religious Services:   . Active Member of Clubs or Organizations:   . Attends Archivist Meetings:   Marland Kitchen Marital Status:   Intimate Partner Violence:   . Fear of Current or Ex-Partner:   . Emotionally Abused:   Marland Kitchen Physically Abused:   . Sexually Abused:      Review of Systems  All other systems reviewed and are negative.      Objective:   Physical  Exam Constitutional:      General: He is not in acute distress.    Appearance: He is not ill-appearing or toxic-appearing.  Cardiovascular:     Rate and Rhythm: Normal rate and regular rhythm.     Heart sounds: Normal heart sounds.  Pulmonary:     Effort: Pulmonary effort is normal.     Breath sounds: No wheezing or rales.  Chest:     Chest wall: No tenderness.  Abdominal:     General: Bowel sounds are normal. There is no distension.     Palpations: Abdomen is soft.     Tenderness: There is no abdominal tenderness.  Musculoskeletal:     Right lower leg: No edema.     Left lower leg: No edema.  Skin:    Findings: No erythema or rash.  Neurological:     Mental Status: He is alert.           Assessment & Plan:  Uncontrolled type 2 diabetes mellitus with hyperglycemia ([place]) - Plan: CBC with Differential/Platelet, COMPLETE METABOLIC PANEL WITH GFR  MRSA bacteremia - Plan: CBC with Differential/Platelet, COMPLETE METABOLIC PANEL WITH GFR  Sepsis due to methicillin resistant Staphylococcus aureus (MRSA), unspecified whether acute organ dysfunction present ([place]) - Plan: CBC with Differential/Platelet, COMPLETE METABOLIC PANEL WITH GFR  Acute kidney injury ([place]) - Plan: CBC with Differential/Platelet, COMPLETE METABOLIC PANEL WITH GFR  Paroxysmal atrial fibrillation ([place]) - Plan: CBC with Differential/Platelet, COMPLETE METABOLIC PANEL WITH GFR  I believe his initial hospitalization was due to his uncontrolled diabetes due to medication noncompliance.  I believe he likely require bacteremia around that hospital admission prompting his second hospitalization.  Therefore I believe if we can control his sugars we can help prevent readmissions like this in the future.  I spent more than 30 minutes today with the patient and his son explaining the importance of compliance  and supervision of insulin.  Therefore of asked that his son administer the insulin 24 units every morning.  Of asked  him to record the fasting blood sugars in the morning as well.  He is to take metformin twice a day 1000 mg.  He is to take Zyvox twice a day for 1 month total.  I will check a CBC and a CMP today and then weekly until he completes the antibiotics.  Strongly stressed the need for compliance.  Also respectfully asked the son to supervise medications to avoid accidents in administration or missed doses

## 2020-04-26 NOTE — Telephone Encounter (Signed)
Last refill 03/25/20

## 2020-04-27 ENCOUNTER — Telehealth: Payer: Self-pay

## 2020-04-27 ENCOUNTER — Other Ambulatory Visit: Payer: Self-pay | Admitting: *Deleted

## 2020-04-27 DIAGNOSIS — E1122 Type 2 diabetes mellitus with diabetic chronic kidney disease: Secondary | ICD-10-CM | POA: Diagnosis not present

## 2020-04-27 DIAGNOSIS — I5033 Acute on chronic diastolic (congestive) heart failure: Secondary | ICD-10-CM | POA: Diagnosis not present

## 2020-04-27 DIAGNOSIS — N183 Chronic kidney disease, stage 3 unspecified: Secondary | ICD-10-CM | POA: Diagnosis not present

## 2020-04-27 DIAGNOSIS — I130 Hypertensive heart and chronic kidney disease with heart failure and stage 1 through stage 4 chronic kidney disease, or unspecified chronic kidney disease: Secondary | ICD-10-CM | POA: Diagnosis not present

## 2020-04-27 DIAGNOSIS — I0981 Rheumatic heart failure: Secondary | ICD-10-CM | POA: Diagnosis not present

## 2020-04-27 NOTE — Telephone Encounter (Signed)
ok 

## 2020-04-27 NOTE — Telephone Encounter (Signed)
Edward [name] from [facility] called, Ok to draw labs every Monday for 5 weeks for [name]

## 2020-04-27 NOTE — Patient Outreach (Signed)
[facility]) Care Management  04/27/2020  Edward [name] 1935-09-06 [id number]   Covering for assigned care manager, L. Zigmund Daniel.  Outreach attempt #2, successful.    Call placed to member to follow up on recent discharge, was sent home on 7/20 with [facility].  He report he is "doing ok."  State his breathing has been good, denies any shortness of breath.  Confirms he was seen by PCP yesterday, will have another follow up on 8/2, son will provide transportation.  Per chart, noted that member last hospitalization may have been directly impacted by his uncontrolled diabetes.  Initially he report he was taking 48 units of Lantus daily, but then state he understood he should be taking 24 units according to discharge instructions and PCP notes.  Report he is also compliant with other medications, including antibiotics.  State he is compliant with managing blood sugars, report reading today was 120.  Verbalizes understanding of diabetic diet.  Denies any urgent concerns, will provide update to assigned care manager for follow up within the next month.  Goals    .  COPD      CARE PLAN ENTRY  Current Barriers:  . Chronic Disease Management support, education, and care coordination needs related to COPD  Pharmacist Clinical Goal(s):  Marland Kitchen Over the next 30 days patient will work with PhamD to optimize medication related to COPD.  Interventions: . Comprehensive medication review performed.  Patient Self Care Activities:  . Over the next 30 days patient is to report any increase in symptoms to PharmD or PCP.  Initial goal documentation     .  High Blood Pressure - < 130/80      CARE PLAN ENTRY (see longitudinal plan of care for additional care plan information)  Current Barriers:  . Uncontrolled hypertension, complicated by hyperlipidemia, Type II diabetes, and COPD. . Current antihypertensive regimen: clonidine 0.'1mg'$  twice daily, furosemide '40mg'$  daily, metoprolol  '25mg'$  twice daily . Previous antihypertensives tried: diltiazem '240mg'$  ER, quinapril '20mg'$  . Last practice recorded BP readings:  BP Readings from Last 3 Encounters:  01/23/20 (!) 150/86  01/19/20 115/65  01/13/20 130/60     Pharmacist Clinical Goal(s):  Marland Kitchen Over the next 30 days, patient will work with PharmD and providers to optimize antihypertensive regimen  Interventions: . Inter-disciplinary care team collaboration (see longitudinal plan of care) . Comprehensive medication review performed; medication list updated in the electronic medical record.  . Encouraged patient to go check BP at grocery store or pharmacy whenever is convenient since he does not have monitor at home.  Patient Self Care Activities:  . Patient will continue to check BP as he is able , document, and provide at future appointments . Patient will focus on medication adherence by pill count. . Over the next 30 days if patient check BP and is > 130/80 patient is to contact PCP or PharmD.  Initial goal documentation     .  High Cholesterol - LDL < 100      CARE PLAN ENTRY (see longitudinal plan of care for additional care plan information)  Current Barriers:  . Uncontrolled hyperlipidemia, complicated by Type II DM, Hypertension, and COPD. . Current antihyperlipidemic regimen: pravastatin '80mg'$  daily . Previous antihyperlipidemic medications tried simvastatin  . Most recent lipid panel:     Component Value Date/Time   CHOL 203 (H) 10/13/2019 1035   TRIG 490 (H) 10/13/2019 1035   HDL 36 (L) 10/13/2019 1035   CHOLHDL 5.6 (H) 10/13/2019 1035  VLDL 25 09/26/2017 0655   LDLCALC  10/13/2019 1035     Comment:     . LDL cholesterol not calculated. Triglyceride levels greater than 400 mg/dL invalidate calculated LDL results. . Reference range: <100 . Desirable range <100 mg/dL for primary prevention;   <70 mg/dL for patients with CHD or diabetic patients  with > or = 2 CHD risk factors. Marland Kitchen LDL-C is now  calculated using the Martin-Hopkins  calculation, which is a validated novel method providing  better accuracy than the Friedewald equation in the  estimation of LDL-C.  Cresenciano Genre et al. Annamaria Helling. 5462;703(50): 2061-2068  (http://education.[employer].com/faq/FAQ164)     Pharmacist Clinical Goal(s):  Marland Kitchen Over the next 30 days, patient will work with PharmD and providers towards optimized antihyperlipidemic therapy  Interventions: . Comprehensive medication review performed; medication list updated in electronic medical record.  Bertram Savin care team collaboration (see longitudinal plan of care) . Patient will work on diet and carbohydrate counting, limiting portion sizes to 30-45 grams of carbs per meal.   Patient Self Care Activities:  . Patient will focus on medication adherence by pill count. . Over the next 30 days patient will attempt to make small changes in diet including limiting carbohydrates to one serving of breads per day.  Initial goal documentation     .  Keep getting better (pt-stated)      CARE PLAN ENTRY (see longtitudinal plan of care for additional care plan information)  Objective:  Lab Results  Component Value Date   HGBA1C 8.9 (H) 04/14/2020 .   Lab Results  Component Value Date   CREATININE 1.42 (H) 04/26/2020   CREATININE 1.14 04/20/2020   CREATININE 1.19 04/19/2020 .   Marland Kitchen No results found for: EGFR  Current Barriers:  Marland Kitchen Knowledge Deficits related to basic Diabetes pathophysiology and self care/management . Knowledge Deficits related to self administration of insulin  Case Manager Clinical Goal(s):  Over the next 60 days, patient will demonstrate improved adherence to prescribed treatment plan for diabetes self care/management as evidenced by: decreased A1C lower than 8 . Verbalize daily monitoring and recording of CBG within 28 days . Verbalize adherence to prescribed medication regimen within the next 28 days   Interventions:   . Discussed plans with patient for ongoing care management follow up and provided patient with direct contact information for care management team . Reviewed scheduled/upcoming provider appointments including: PCP follow up on 8/2 . Advised patient, providing education and rationale, to check cbg daily and record, calling PCP for findings outside established parameters.    Patient Self Care Activities:  . Self administers insulin as prescribed . Attends all scheduled provider appointments . Checks blood sugars as prescribed and utilize hyper and hypoglycemia protocol as needed  Initial goal documentation     .  Post Herpetic Neuralgia      CARE PLAN ENTRY  Current Barriers:  . Chronic Disease Management support, education, and care coordination needs related to  post herpetic neuralgia  Pharmacist Clinical Goal(s):  Marland Kitchen Over the next 180 days, patient will work with pharmacist to achieve goal of optimal pain management and medication to treat symptoms of post herpetic neuralgia.  Interventions: . Comprehensive medication review performed. . Recommend follow up with PCP for long-term post herpetic neuralgia treatment. . Counseled on appropriate regimen based on current medication list.  Patient Self Care Activities:  . Patient verbalizes understanding of plan as described above, Calls provider office for new concerns or questions, and will pick up new  medication for Lyrica if not already done so. . Over the next 180 days patient will contact PharmD or PCP if there is no symptom relief from neuralgia.  Initial goal documentation     .  Type II Diabetes - A1c < 7%      CARE PLAN ENTRY (see longitudinal plan of care for additional care plan information)  Current Barriers:  . Type II diabetes complicated by chronic medical conditions including hyperlipidemia, hypertension, and COPD. Lab Results  Component Value Date   HGBA1C 11.8 (H) 01/23/2020 .   Lab Results  Component Value  Date   CREATININE 0.88 01/23/2020   CREATININE 0.88 01/19/2020   CREATININE 1.07 12/27/2019   . Current antihyperglycemic regimen: Metformin '1000mg'$  twice daily, Basaglar 100u/ml 30 units hs . Denies hypoglycemic symptoms, including dizziness, lightheadedness, shaking, sweating . 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 . Current meal patterns: o Breakfast:Biscuit daily, sometimes grits, eggs o Supper:Cornbread, tomato sandwich on white bread, banana sandwich on white bread o Snacks: peanut butter on crackers o Drinks:diet soda . Current exercise: none . Current blood glucose readings:   Pharmacist Clinical Goal(s):  Marland Kitchen Over the next 180 days, patient will work with PharmD and primary care provider to address hyperglycemia and elevated A1c.  Interventions: . Comprehensive medication review performed, medication list updated in electronic medical record . Inter-disciplinary care team collaboration (see longitudinal plan of care) . Check blood sugar nightly a couple times per week and record in log until next meeting with PharmD. Marland Kitchen Patient approved for temporary 90 day supply of Jardiance.  Patient Self Care Activities:  . Patient will check blood glucose once or twice daily, document, and provide at future appointments . Patient will focus on medication adherence by pill count . Patient will take medications as prescribed . Patient will contact provider with any episodes of hypoglycemia . Patient will report any questions or concerns to provider  . Over the next 180 days patient will try to check blood sugar and additional time daily, preferably night time 2 hours after eating dinner and report consistent readings > 150 to PharmD or PCP. Marland Kitchen Begin taking Jardiance '10mg'$  once daily and continue to monitor blood sugar, report and < 70 to PharmD.  Please see past updates related to this goal by clicking on the "Past Updates" button in the selected goal        Valente David, Therapist, sports, MSN [place] Manager [phone]

## 2020-04-28 ENCOUNTER — Telehealth: Payer: Self-pay

## 2020-04-28 NOTE — Telephone Encounter (Signed)
Amy L. from [facility] called for verbal orders for Physical Therapy for  Edward [name]  1 week 1  2 week 2  For fall prevention Gait balance Transfer Lower Extremities Ok to give verbal orders ?

## 2020-04-28 NOTE — Telephone Encounter (Signed)
Spoke with Inez Catalina the nurse from [facility], to give her verbal ok from Mr. Edward [name]'s health care provider, to have his labs drawn every Monday for the next 5 weeks.

## 2020-04-29 NOTE — Telephone Encounter (Signed)
ok 

## 2020-04-29 NOTE — Telephone Encounter (Signed)
Verbal orders was given for Edward [name]. To [place] [phone]. 1 wk 1, 2 wk 2, for fall prevention, gait balance, transfer, lower extremities

## 2020-04-30 DIAGNOSIS — I5033 Acute on chronic diastolic (congestive) heart failure: Secondary | ICD-10-CM | POA: Diagnosis not present

## 2020-04-30 DIAGNOSIS — I130 Hypertensive heart and chronic kidney disease with heart failure and stage 1 through stage 4 chronic kidney disease, or unspecified chronic kidney disease: Secondary | ICD-10-CM | POA: Diagnosis not present

## 2020-04-30 DIAGNOSIS — I0981 Rheumatic heart failure: Secondary | ICD-10-CM | POA: Diagnosis not present

## 2020-04-30 DIAGNOSIS — N183 Chronic kidney disease, stage 3 unspecified: Secondary | ICD-10-CM | POA: Diagnosis not present

## 2020-04-30 DIAGNOSIS — E1122 Type 2 diabetes mellitus with diabetic chronic kidney disease: Secondary | ICD-10-CM | POA: Diagnosis not present

## 2020-05-02 DIAGNOSIS — J449 Chronic obstructive pulmonary disease, unspecified: Secondary | ICD-10-CM | POA: Diagnosis not present

## 2020-05-03 ENCOUNTER — Ambulatory Visit (INDEPENDENT_AMBULATORY_CARE_PROVIDER_SITE_OTHER): Payer: Medicare Other | Admitting: Family Medicine

## 2020-05-03 ENCOUNTER — Other Ambulatory Visit: Payer: Self-pay

## 2020-05-03 VITALS — BP 120/50 | HR 61 | Temp 97.7°F | Wt 161.0 lb

## 2020-05-03 DIAGNOSIS — B958 Unspecified staphylococcus as the cause of diseases classified elsewhere: Secondary | ICD-10-CM | POA: Diagnosis not present

## 2020-05-03 DIAGNOSIS — R7881 Bacteremia: Secondary | ICD-10-CM

## 2020-05-03 DIAGNOSIS — E1165 Type 2 diabetes mellitus with hyperglycemia: Secondary | ICD-10-CM

## 2020-05-03 DIAGNOSIS — I480 Paroxysmal atrial fibrillation: Secondary | ICD-10-CM | POA: Diagnosis not present

## 2020-05-03 DIAGNOSIS — N183 Chronic kidney disease, stage 3 unspecified: Secondary | ICD-10-CM | POA: Diagnosis not present

## 2020-05-03 DIAGNOSIS — I130 Hypertensive heart and chronic kidney disease with heart failure and stage 1 through stage 4 chronic kidney disease, or unspecified chronic kidney disease: Secondary | ICD-10-CM | POA: Diagnosis not present

## 2020-05-03 DIAGNOSIS — I0981 Rheumatic heart failure: Secondary | ICD-10-CM | POA: Diagnosis not present

## 2020-05-03 DIAGNOSIS — I5033 Acute on chronic diastolic (congestive) heart failure: Secondary | ICD-10-CM | POA: Diagnosis not present

## 2020-05-03 DIAGNOSIS — E1122 Type 2 diabetes mellitus with diabetic chronic kidney disease: Secondary | ICD-10-CM | POA: Diagnosis not present

## 2020-05-03 LAB — CBC WITH DIFFERENTIAL/PLATELET
Absolute Monocytes: 568 cells/uL (ref 200–950)
Basophils Absolute: 73 cells/uL (ref 0–200)
Basophils Relative: 1.1 %
Eosinophils Absolute: 211 cells/uL (ref 15–500)
Eosinophils Relative: 3.2 %
HCT: 36.3 % — ABNORMAL LOW (ref 38.5–50.0)
Hemoglobin: 11.4 g/dL — ABNORMAL LOW (ref 13.2–17.1)
Lymphs Abs: 2165 cells/uL (ref 850–3900)
MCH: 28.8 pg (ref 27.0–33.0)
MCHC: 31.4 g/dL — ABNORMAL LOW (ref 32.0–36.0)
MCV: 91.7 fL (ref 80.0–100.0)
MPV: 11.1 fL (ref 7.5–12.5)
Monocytes Relative: 8.6 %
Neutro Abs: 3584 cells/uL (ref 1500–7800)
Neutrophils Relative %: 54.3 %
Platelets: 306 10*3/uL (ref 140–400)
RBC: 3.96 10*6/uL — ABNORMAL LOW (ref 4.20–5.80)
RDW: 13.3 % (ref 11.0–15.0)
Total Lymphocyte: 32.8 %
WBC: 6.6 10*3/uL (ref 3.8–10.8)

## 2020-05-03 LAB — COMPLETE METABOLIC PANEL WITH GFR
AG Ratio: 1.3 (calc) (ref 1.0–2.5)
ALT: 10 U/L (ref 9–46)
AST: 12 U/L (ref 10–35)
Albumin: 3.8 g/dL (ref 3.6–5.1)
Alkaline phosphatase (APISO): 59 U/L (ref 35–144)
BUN/Creatinine Ratio: 26 (calc) — ABNORMAL HIGH (ref 6–22)
BUN: 35 mg/dL — ABNORMAL HIGH (ref 7–25)
CO2: 29 mmol/L (ref 20–32)
Calcium: 9.1 mg/dL (ref 8.6–10.3)
Chloride: 94 mmol/L — ABNORMAL LOW (ref 98–110)
Creat: 1.33 mg/dL — ABNORMAL HIGH (ref 0.70–1.11)
GFR, Est African American: 57 mL/min/{1.73_m2} — ABNORMAL LOW (ref >=60)
GFR, Est Non African American: 49 mL/min/{1.73_m2} — ABNORMAL LOW (ref >=60)
Globulin: 3 g/dL (calc) (ref 1.9–3.7)
Glucose, Bld: 228 mg/dL — ABNORMAL HIGH (ref 65–99)
Potassium: 4.6 mmol/L (ref 3.5–5.3)
Sodium: 133 mmol/L — ABNORMAL LOW (ref 135–146)
Total Bilirubin: 0.2 mg/dL (ref 0.2–1.2)
Total Protein: 6.8 g/dL (ref 6.1–8.1)

## 2020-05-03 NOTE — Progress Notes (Signed)
Subjective:    Patient ID: Edward [name], male    DOB: 1935-03-09, 85 y.o.   MRN: [id number]  HPI Admission date:  04/14/2020  Admitting Physician  [name], MD  Discharge Date:  04/20/2020   Primary MD  Susy Frizzle, MD  Recommendations for primary care physician for things to follow:   1)Avoid ibuprofen/Advil/Aleve/Motrin/Goody Powders/Naproxen/BC powders/Meloxicam/Diclofenac/Indomethacin and other Nonsteroidal anti-inflammatory medications as these will make you more likely to bleed and can cause stomach ulcers, can also cause Kidney problems.   2)Increase Metformin to 1000 mg twice daily  3)Take the antibiotic Zyvox (Linezolid) 600 mg twice a day for 1 full month due to MRSA bacterial infection in your blood  4)You will Need CBC and BMP blood tests every Monday for the next 5 weeks starting April 26, 2020  5) please change Lantus/glargine insulin injection to 24 units daily  6)Follow UP--PCP for weekly CBC and BMP test for the next 5 weeks while on linezolid/Zyvox  Admission Diagnosis  Hypernatremia [E87.0] Hyperglycemia [R73.9] Acute encephalopathy [K56.25] Acute metabolic encephalopathy [W38.93] Bacteremia [R78.81]   Discharge Diagnosis  Hypernatremia [E87.0] Hyperglycemia [R73.9] Acute encephalopathy [T34.28] Acute metabolic encephalopathy [J68.11] Bacteremia [R78.81]    Principal Problem:   Sepsis due to methicillin resistant Staphylococcus aureus (MRSA) ([place]) Active Problems:   Acute metabolic encephalopathy   COPD (chronic obstructive pulmonary disease) ([name])   Hypertension   Pulmonary fibrosis ([name])   Hypercholesterolemia   DM type 2 causing vascular disease ([place])   Chronic diastolic CHF (congestive heart failure) ([name])   AF (paroxysmal atrial fibrillation) ([name])   Normocytic anemia   MRSA bacteremia   Acute kidney injury ([place])          Past Medical History:  Diagnosis Date   Allergy    Rhinitis   Atrial fibrillation  ([name])    Bronchitis    Chronic respiratory failure ([name])    Colon polyps    COPD (chronic obstructive pulmonary disease) ([place])    Diabetes mellitus    Elevated lipids    Hypercholesterolemia    Hypertension    Iron deficiency anemia due to chronic blood loss 04/30/2018   Noncompliance    On home O2    2L N/C    PSA elevation    Pulmonary fibrosis ([place])    Vitamin D deficiency          Past Surgical History:  Procedure Laterality Date   BIOPSY  04/23/2018   Procedure: BIOPSY;  Surgeon: Danie Binder, MD;  [facility];  Service: Endoscopy;;  duodenum gastric   CATARACT EXTRACTION W/PHACO  06/25/2012   Procedure: CATARACT EXTRACTION PHACO AND INTRAOCULAR LENS PLACEMENT ([place]);  Surgeon: Elta Guadeloupe T. Gershon Crane, MD;  Location: [facility];  Service: Ophthalmology;  Laterality: Left;  CDE=19.01   CATARACT EXTRACTION W/PHACO  07/09/2012   Procedure: CATARACT EXTRACTION PHACO AND INTRAOCULAR LENS PLACEMENT ([facility]);  Surgeon: Elta Guadeloupe T. Gershon Crane, MD;  Location: [facility];  Service: Ophthalmology;  Laterality: Right;  CDE: 20.09   COLONOSCOPY WITH PROPOFOL N/A 04/23/2018   Procedure: COLONOSCOPY WITH PROPOFOL;  Surgeon: Danie Binder, MD;  [facility];  Service: Endoscopy;  Laterality: N/A;   ESOPHAGOGASTRODUODENOSCOPY (EGD) WITH PROPOFOL N/A 04/23/2018   Procedure: ESOPHAGOGASTRODUODENOSCOPY (EGD) WITH PROPOFOL;  Surgeon: Danie Binder, MD;  [facility];  Service: Endoscopy;  Laterality: N/A;     HPI  from the history and physical done on the day of admission:    Edward [name] a84 y.o.malehistory significant  forPAF (atrial fibrillation),pulmonary fibrosis,COPDon home oxygen,type IIdiabetes mellitus, hyperlipidemia, hypertension, iron deficiency anemia,history of noncompliance, PSA elevation,vitamin Dongoing hypoglycemia on documentable encephalopathy on 04/14/2020  -Patient is very confused and a poor  historian -Patient son tells me that patient's blood sugar readings have been reading high which means over 600 for the last couple of days -Patient's wife tells me that patient has been refusing to take insulin for the last couple days -In the ED his bicarb is 28 anion gap is 18 glucose over 500 -Patient received IV fluids and anion gap is down to 14--he remains confused and disoriented -Apparently no vomiting no diarrhea no fevers no chest pain     Hospital Course:     Brief Summary:- 84 y.o.malehistory significant forPAF (atrial fibrillation),pulmonary fibrosis,COPDon home oxygen,type IIdiabetes mellitus, hyperlipidemia, hypertension, iron deficiency anemia,history of noncompliance, PSA elevation,vitamin Dadmitted on 04/14/2020 with severe hyperglycemia with dehydration and AKI resulting in metabolic encephalopathy and found to have MRSA bacteremia  -  A/p 1)MRSA bacteremia--treated with IV vancomycin started on 04/16/2020 -Echo is suboptimal quality due to poor cooperation from patient, valves are not well visualized, EF is 60 to 65% (No definite vegetations were noted on TTE but he does have calcific thickening of the mitral and aortic valves and the TTE was of relatively poor quality) --Family declined TEE, family also reluctant to have PICC line for IV vancomycin due to concerns that patient's confusional episodes may lead him to pull out lines - dc home onp.o. Zyvox for 1 month -Infectious disease consult/input from Dr. Jenny Reichmann Campbellappreciated WBC 6.9 Microbiology results: 7/16BCx2:NGTD 7/16UCx: -Neg 7/15 BC x2: BCID= MRSA in 1 of 2 bottles -- Antimicrobials this admission: vancomycin7/16>> ceftriaxone 7/16>>7/16 -Discharge home on Zyvox/linezolid 600 mg twice daily for 1 month, get CBC and BMP every Monday for 5 weeks -  2)acute metabolic encephalopathy in the setting of severe hyperglycemia, dehydration and AKI and MRSA bacteremia--patient  was hydrated IV until oral intake improved,  -UA not suggestive of UTI Leukocytosis white count down to6.55fom 15.7  --Appears to be back to baseline   2)chronic hypoxic respiratory failure--continue supplemental oxygen- -- ABG w/o hypercapnia  -Clinically and radiologically no pneumonia -Continue home O2 at 2 L/min  3)AKI--due to dehydration and hyperglycemia, baseline creatinine is usually between 0.9 and 1, creatinine is 1.56 -Creatinine is down to 1.14 , renally adjust medications, avoid nephrotoxic agents / dehydration / hypotension Decrease Lasix to 20 mg daily okay to restart Metformin  4)PAFIb--stable, continue metoprolol for rate control, continue Eliquis for stroke prophylaxis  5)COPD-no acute exacerbation at this time, continue bronchodilators,   6)uncontrolled DM--A1c 8.9, PTA patient was on Lantus 30 units daily  --During this hospital stay patient was on Lantus insulin  20 units along with mealtime insulin and additional sliding scale as needed -Patient has difficulty following complex instructions -In an attempt to simplify patient's regimen we will discharge on Lantus 24 units daily without any sliding scale or short-acting mealtime insulin -Metformin 1 g twice daily as ordered  7)HyperNatremia- due to  free water deficit setting of normal saline infusions, -Resolved with improvement in oral intake -Sodium is down to 1434fm 156  8)Sepsis discussion----on admission patient met SIRS criteria, 2 SIRS criteria: HR 90s to 100 & a WBC of 12.7, patient had AKI and acute metabolic encephalopathy --Blood cultures later grew MRSA in 1 of 2 bottles raising strong possibility of MRSA bacteremia and sepsis  -On admission initially AKI--was deemed to be due to  dehydration in the setting of  poor oral intake (confused patient)  and severe hyperglycemia --On admission acute metabolic encephalopathy was initially deemed to be secondary to severe hyperglycemia,  dehydration and AKI -On admission leukocytosis was deemed to be possibly reactive due to severe hyperglycemia dehydration and AKI ----In retrospect patient met sepsis criteria on admission -Patient has MRSA bacteremia with concern for possible endocarditis  Disposition--discharge home with home health RN, home health PT and home health social worker to help improve compliance with medications and diet -Hopefully we can reduce patient's readmission rate   04/26/20 Patient is here today with his son for follow-up.  Compliance has been an issue for this patient.  I asked the patient how many units of insulin he is taking.  His son states that he is taking 24 units.  The patient belatedly states that he is taking 30 units.  Apparently some days the son supervises the insulin.  Other days the father self administers the insulin.  The hospital change the dose.  I definitely believe there is some confusion and potentially missed doses of insulin at home.  I next asked the patient how often he is taking the antibiotic.  Patient is unable to answer that question.  The son states once a day.  He is supposed to be on Zyvox twice daily for 1 month for MRSA bacteremia.  I emphasized this to the patient and the son today and also emphasized the importance of taking this medication.  He denies any fever or chills.  Son denies any confusion.  I next asked the patient how often he is taking his metformin.  He states that he is taking at 1000 mg which is the new dose however he is only taking it once a day.  I emphasized to the patient that the hospital had recommended he take this twice a day to better manage his blood sugars.  I believe that due to confusion, the patient is often not taking his medication correctly and may not be taking it at all.  I explained this to the patient's son.  I believe that the son needs to supervise his father's administration of insulin.  I explained that this would help reduce his risk of  rehospitalization.  Patient denies any cough or shortness of breath or chest pain or dysuria.  His lungs are actually clear to auscultation today.  He does request something for pain.  Given his recent encephalopathy I would not refill his pain medication at this time.  I stated that we will need to depend on Lyrica to help manage his postherpetic neuralgia from now on and avoid opiates.  At that time, my plan was: I believe his initial hospitalization was due to his uncontrolled diabetes due to medication noncompliance.  I believe he likely require bacteremia around that hospital admission prompting his second hospitalization.  Therefore I believe if we can control his sugars we can help prevent readmissions like this in the future.  I spent more than 30 minutes today with the patient and his son explaining the importance of compliance and supervision of insulin.  Therefore of asked that his son administer the insulin 24 units every morning.  Of asked him to record the fasting blood sugars in the morning as well.  He is to take metformin twice a day 1000 mg.  He is to take Zyvox twice a day for 1 month total.  I will check a CBC and a CMP today and then weekly until he completes the antibiotics.  Strongly stressed the need for compliance.  Also respectfully asked the son to supervise medications to avoid accidents in administration or missed doses  05/03/20 Patient is here today for follow-up.  His son brings his blood sugars with him.  Fasting blood sugars range from 116-213.  The last 3 days his blood sugars have been 180 190 and 213.  The first 4 days were between 116 and 130.  The patient states that he has very little appetite.  He believes the antibiotic is affecting his appetite making him feel nauseated.  He denies any fevers or chills or other signs of systemic illness.  He is still taking 24 units of Basaglar daily and his son is monitoring this.  He is here today for lab work as well to monitor for  toxicity from the Zyvox Past Medical History:  Diagnosis Date   Allergy    Rhinitis   Atrial fibrillation ([place])    Bronchitis    Chronic respiratory failure ([name])    Colon polyps    COPD (chronic obstructive pulmonary disease) ([place])    Diabetes mellitus    Elevated lipids    Hypercholesterolemia    Hypertension    Iron deficiency anemia due to chronic blood loss 04/30/2018   Noncompliance    On home O2    2L N/C    PSA elevation    Pulmonary fibrosis ([name])    Vitamin D deficiency    Past Surgical History:  Procedure Laterality Date   BIOPSY  04/23/2018   Procedure: BIOPSY;  Surgeon: Danie Binder, MD;  [facility];  Service: Endoscopy;;  duodenum gastric   CATARACT EXTRACTION W/PHACO  06/25/2012   Procedure: CATARACT EXTRACTION PHACO AND INTRAOCULAR LENS PLACEMENT ([place]);  Surgeon: Elta Guadeloupe T. Gershon Crane, MD;  Location: [facility];  Service: Ophthalmology;  Laterality: Left;  CDE=19.01   CATARACT EXTRACTION W/PHACO  07/09/2012   Procedure: CATARACT EXTRACTION PHACO AND INTRAOCULAR LENS PLACEMENT ([facility]);  Surgeon: Elta Guadeloupe T. Gershon Crane, MD;  Location: [facility];  Service: Ophthalmology;  Laterality: Right;  CDE: 20.09   COLONOSCOPY WITH PROPOFOL N/A 04/23/2018   Procedure: COLONOSCOPY WITH PROPOFOL;  Surgeon: Danie Binder, MD;  [facility];  Service: Endoscopy;  Laterality: N/A;   ESOPHAGOGASTRODUODENOSCOPY (EGD) WITH PROPOFOL N/A 04/23/2018   Procedure: ESOPHAGOGASTRODUODENOSCOPY (EGD) WITH PROPOFOL;  Surgeon: Danie Binder, MD;  [facility];  Service: Endoscopy;  Laterality: N/A;   Current Outpatient Medications on File Prior to Visit  Medication Sig Dispense Refill   acetaminophen (TYLENOL) 500 MG tablet Take 500 mg by mouth every 6 (six) hours as needed for headache.     albuterol (PROVENTIL) (2.5 MG/3ML) 0.083% nebulizer solution INHALE 1 VIAL VIA NEBULIZER EVERY 6 HOURS AS NEEDED FOR WHEEZING OR SHORTNESS OF BREATH 360 mL 4    budesonide-formoterol (SYMBICORT) 160-4.5 MCG/ACT inhaler Inhale 2 puffs into the lungs 2 (two) times daily. 1 Inhaler 3   cloNIDine (CATAPRES) 0.1 MG tablet Take 1 tablet (0.1 mg total) by mouth daily.     ELIQUIS 5 MG TABS tablet Take 1 tablet by mouth twice daily 60 tablet 11   furosemide (LASIX) 20 MG tablet Take 1 tablet (20 mg total) by mouth daily. 30 tablet 1   Insulin Glargine (BASAGLAR KWIKPEN) 100 UNIT/ML Inject 0.24 mLs (24 Units total) into the skin at bedtime. 10 pen 3   ipratropium-albuterol (DUONEB) 0.5-2.5 (3) MG/3ML SOLN Take 3 mLs by nebulization every 6 (six) hours as needed. 360 mL 1  linezolid (ZYVOX) 600 MG tablet Take 1 tablet (600 mg total) by mouth 2 (two) times daily. 60 tablet 0   metFORMIN (GLUCOPHAGE) 1000 MG tablet Take 1 tablet (1,000 mg total) by mouth 2 (two) times daily with a meal. (Patient taking differently: Take 500 mg by mouth 2 (two) times daily with a meal. ) 60 tablet 11   metoprolol tartrate (LOPRESSOR) 50 MG tablet Take 1 tablet (50 mg total) by mouth 2 (two) times daily. 60 tablet 2   OXYGEN Inhale 3 L into the lungs continuous.      potassium chloride SA (KLOR-CON) 20 MEQ tablet Take 1 tablet (20 mEq total) by mouth daily. 30 tablet 1   pravastatin (PRAVACHOL) 80 MG tablet TAKE 1 TABLET BY MOUTH AT BEDTIME 30 tablet 3   pregabalin (LYRICA) 100 MG capsule TAKE 1 CAPSULE BY MOUTH TWICE DAILY. STOP GABAPENTIN 60 capsule 0   No current facility-administered medications on file prior to visit.   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  Tobacco  Use   Smoking status: Former Smoker    Packs/day: 1.50    Years: 60.00    Pack years: 90.00    Types: Cigarettes    Quit date: 12/31/2012    Years since quitting: 7.3   Smokeless tobacco: Never Used  Vaping Use   Vaping Use: Never used  Substance and Sexual Activity   Alcohol use: No   Drug use: No   Sexual activity: Yes    Birth control/protection: None  Other Topics Concern   Not on file  Social History Narrative   Not on file   Social Determinants of Health   Financial Resource Strain: Low Risk    Difficulty of Paying Living Expenses: Not very hard  Food Insecurity:    Worried About Charity fundraiser in the Last Year:    Arboriculturist in the Last Year:   Transportation Needs: No Transportation Needs   Lack of Transportation (Medical): No   Lack of Transportation (Non-Medical): No  Physical Activity: Inactive   Days of Exercise per Week: 0 days   Minutes of Exercise per Session: 0 min  Stress: No Stress Concern Present   Feeling of Stress : Only a little  Social Connections:    Frequency of Communication with Friends and Family:    Frequency of Social Gatherings with Friends and Family:    Attends Religious Services:    Active Member of Clubs or Organizations:    Attends Music therapist:    Marital Status:   Intimate Partner Violence:    Fear of Current or Ex-Partner:    Emotionally Abused:    Physically Abused:    Sexually Abused:      Review of Systems  All other systems reviewed and are negative.      Objective:   Physical Exam Constitutional:      General: He is not in acute distress.    Appearance: He is not ill-appearing or toxic-appearing.  Cardiovascular:     Rate and Rhythm: Normal rate and  regular rhythm.     Heart sounds: Normal heart sounds.  Pulmonary:     Effort: Pulmonary effort is normal.     Breath sounds: No wheezing or rales.  Chest:     Chest wall: No tenderness.  Abdominal:      General: Bowel sounds are normal. There is no distension.     Palpations: Abdomen is soft.     Tenderness: There is no abdominal tenderness.  Musculoskeletal:     Right lower leg: No edema.     Left lower leg: No edema.  Skin:    Findings: No erythema or rash.  Neurological:     Mental Status: He is alert.           Assessment & Plan:  Bacteremia due to Staphylococcus - Plan: CBC with Differential/Platelet, COMPLETE METABOLIC PANEL WITH GFR  Uncontrolled type 2 diabetes mellitus with hyperglycemia ([name])  Paroxysmal atrial fibrillation ([name])  Check CMP and CBC as planned due to Zyvox.  We will continue to check these weekly until he is off the Zyvox which will require a total of 1 month of therapy.  He is on week 2.  Blood sugars are still slightly elevated.  Increase Basaglar to 30 units a day.  Patient is currently rate controlled and appropriately anticoagulated.  Reassess in 1 week.

## 2020-05-04 ENCOUNTER — Other Ambulatory Visit: Payer: Self-pay

## 2020-05-04 DIAGNOSIS — E871 Hypo-osmolality and hyponatremia: Secondary | ICD-10-CM

## 2020-05-04 DIAGNOSIS — I130 Hypertensive heart and chronic kidney disease with heart failure and stage 1 through stage 4 chronic kidney disease, or unspecified chronic kidney disease: Secondary | ICD-10-CM | POA: Diagnosis not present

## 2020-05-04 DIAGNOSIS — E1122 Type 2 diabetes mellitus with diabetic chronic kidney disease: Secondary | ICD-10-CM | POA: Diagnosis not present

## 2020-05-04 DIAGNOSIS — N179 Acute kidney failure, unspecified: Secondary | ICD-10-CM

## 2020-05-04 DIAGNOSIS — I5033 Acute on chronic diastolic (congestive) heart failure: Secondary | ICD-10-CM | POA: Diagnosis not present

## 2020-05-04 DIAGNOSIS — I0981 Rheumatic heart failure: Secondary | ICD-10-CM | POA: Diagnosis not present

## 2020-05-04 DIAGNOSIS — N183 Chronic kidney disease, stage 3 unspecified: Secondary | ICD-10-CM | POA: Diagnosis not present

## 2020-05-04 DIAGNOSIS — N1831 Chronic kidney disease, stage 3a: Secondary | ICD-10-CM

## 2020-05-07 DIAGNOSIS — N183 Chronic kidney disease, stage 3 unspecified: Secondary | ICD-10-CM | POA: Diagnosis not present

## 2020-05-07 DIAGNOSIS — I130 Hypertensive heart and chronic kidney disease with heart failure and stage 1 through stage 4 chronic kidney disease, or unspecified chronic kidney disease: Secondary | ICD-10-CM | POA: Diagnosis not present

## 2020-05-07 DIAGNOSIS — I5033 Acute on chronic diastolic (congestive) heart failure: Secondary | ICD-10-CM | POA: Diagnosis not present

## 2020-05-07 DIAGNOSIS — I0981 Rheumatic heart failure: Secondary | ICD-10-CM | POA: Diagnosis not present

## 2020-05-07 DIAGNOSIS — E1122 Type 2 diabetes mellitus with diabetic chronic kidney disease: Secondary | ICD-10-CM | POA: Diagnosis not present

## 2020-05-10 ENCOUNTER — Telehealth: Payer: Self-pay | Admitting: *Deleted

## 2020-05-10 DIAGNOSIS — I0981 Rheumatic heart failure: Secondary | ICD-10-CM | POA: Diagnosis not present

## 2020-05-10 DIAGNOSIS — N183 Chronic kidney disease, stage 3 unspecified: Secondary | ICD-10-CM | POA: Diagnosis not present

## 2020-05-10 DIAGNOSIS — I130 Hypertensive heart and chronic kidney disease with heart failure and stage 1 through stage 4 chronic kidney disease, or unspecified chronic kidney disease: Secondary | ICD-10-CM | POA: Diagnosis not present

## 2020-05-10 DIAGNOSIS — E1122 Type 2 diabetes mellitus with diabetic chronic kidney disease: Secondary | ICD-10-CM | POA: Diagnosis not present

## 2020-05-10 DIAGNOSIS — I5033 Acute on chronic diastolic (congestive) heart failure: Secondary | ICD-10-CM | POA: Diagnosis not present

## 2020-05-10 NOTE — Telephone Encounter (Signed)
Betty home health nurse from [place] calling to report she is out seeing pt today and drew labs. His O2 on room air is 90 % and he is declining to wear oxygen. Weight today is up to 162.6 and states he declines to weight every day. She left on voicemail that he had an appt with dr pickard tomorrow but I do not see where he does.   [place] call back number [phone]

## 2020-05-11 NOTE — Telephone Encounter (Signed)
I want to see him in office this week.

## 2020-05-11 NOTE — Telephone Encounter (Signed)
Appt was made for Pt  05/14/2020

## 2020-05-14 ENCOUNTER — Other Ambulatory Visit: Payer: Self-pay

## 2020-05-14 ENCOUNTER — Ambulatory Visit (INDEPENDENT_AMBULATORY_CARE_PROVIDER_SITE_OTHER): Payer: Medicare Other | Admitting: Family Medicine

## 2020-05-14 VITALS — BP 120/58 | HR 67 | Temp 97.2°F | Ht 65.0 in | Wt 161.0 lb

## 2020-05-14 DIAGNOSIS — J449 Chronic obstructive pulmonary disease, unspecified: Secondary | ICD-10-CM | POA: Diagnosis not present

## 2020-05-14 DIAGNOSIS — B958 Unspecified staphylococcus as the cause of diseases classified elsewhere: Secondary | ICD-10-CM | POA: Diagnosis not present

## 2020-05-14 DIAGNOSIS — N401 Enlarged prostate with lower urinary tract symptoms: Secondary | ICD-10-CM

## 2020-05-14 DIAGNOSIS — E1165 Type 2 diabetes mellitus with hyperglycemia: Secondary | ICD-10-CM | POA: Diagnosis not present

## 2020-05-14 DIAGNOSIS — R7881 Bacteremia: Secondary | ICD-10-CM | POA: Diagnosis not present

## 2020-05-14 DIAGNOSIS — Z136 Encounter for screening for cardiovascular disorders: Secondary | ICD-10-CM | POA: Diagnosis not present

## 2020-05-14 LAB — CBC WITH DIFFERENTIAL/PLATELET
Absolute Monocytes: 451 cells/uL (ref 200–950)
Basophils Absolute: 48 cells/uL (ref 0–200)
Basophils Relative: 0.9 %
Eosinophils Absolute: 339 cells/uL (ref 15–500)
Eosinophils Relative: 6.4 %
HCT: 34.7 % — ABNORMAL LOW (ref 38.5–50.0)
Hemoglobin: 11.1 g/dL — ABNORMAL LOW (ref 13.2–17.1)
Lymphs Abs: 1712 cells/uL (ref 850–3900)
MCH: 29.6 pg (ref 27.0–33.0)
MCHC: 32 g/dL (ref 32.0–36.0)
MCV: 92.5 fL (ref 80.0–100.0)
MPV: 11.4 fL (ref 7.5–12.5)
Monocytes Relative: 8.5 %
Neutro Abs: 2751 cells/uL (ref 1500–7800)
Neutrophils Relative %: 51.9 %
Platelets: 201 10*3/uL (ref 140–400)
RBC: 3.75 10*6/uL — ABNORMAL LOW (ref 4.20–5.80)
RDW: 12.8 % (ref 11.0–15.0)
Total Lymphocyte: 32.3 %
WBC: 5.3 10*3/uL (ref 3.8–10.8)

## 2020-05-14 LAB — BASIC METABOLIC PANEL WITH GFR
BUN/Creatinine Ratio: 30 (calc) — ABNORMAL HIGH (ref 6–22)
BUN: 34 mg/dL — ABNORMAL HIGH (ref 7–25)
CO2: 35 mmol/L — ABNORMAL HIGH (ref 20–32)
Calcium: 9.4 mg/dL (ref 8.6–10.3)
Chloride: 95 mmol/L — ABNORMAL LOW (ref 98–110)
Creat: 1.15 mg/dL — ABNORMAL HIGH (ref 0.70–1.11)
GFR, Est African American: 68 mL/min/{1.73_m2} (ref >=60)
GFR, Est Non African American: 59 mL/min/{1.73_m2} — ABNORMAL LOW (ref >=60)
Glucose, Bld: 245 mg/dL — ABNORMAL HIGH (ref 65–99)
Potassium: 5.3 mmol/L (ref 3.5–5.3)
Sodium: 138 mmol/L (ref 135–146)

## 2020-05-14 LAB — PSA: PSA: 0.6 ng/mL (ref <=4.0)

## 2020-05-14 MED ORDER — TAMSULOSIN HCL 0.4 MG PO CAPS: 0.4000 mg | ORAL_CAPSULE | Freq: Every day | ORAL | 3 refills | Status: DC

## 2020-05-14 NOTE — Progress Notes (Signed)
Subjective:    Patient ID: Edward [name], male    DOB: Sep 05, 1935, 85 y.o.   MRN: [id number]  HPI Admission date:  04/14/2020  Admitting Physician  [name], MD  Discharge Date:  04/20/2020   Primary MD  Susy Frizzle, MD  Recommendations for primary care physician for things to follow:   1)Avoid ibuprofen/Advil/Aleve/Motrin/Goody Powders/Naproxen/BC powders/Meloxicam/Diclofenac/Indomethacin and other Nonsteroidal anti-inflammatory medications as these will make you more likely to bleed and can cause stomach ulcers, can also cause Kidney problems.   2)Increase Metformin to 1000 mg twice daily  3)Take the antibiotic Zyvox (Linezolid) 600 mg twice a day for 1 full month due to MRSA bacterial infection in your blood  4)You will Need CBC and BMP blood tests every Monday for the next 5 weeks starting April 26, 2020  5) please change Lantus/glargine insulin injection to 24 units daily  6)Follow UP--PCP for weekly CBC and BMP test for the next 5 weeks while on linezolid/Zyvox  Admission Diagnosis  Hypernatremia [E87.0] Hyperglycemia [R73.9] Acute encephalopathy [L27.51] Acute metabolic encephalopathy [Z00.17] Bacteremia [R78.81]   Discharge Diagnosis  Hypernatremia [E87.0] Hyperglycemia [R73.9] Acute encephalopathy [C94.49] Acute metabolic encephalopathy [Q75.91] Bacteremia [R78.81]    Principal Problem:   Sepsis due to methicillin resistant Staphylococcus aureus (MRSA) ([place]) Active Problems:   Acute metabolic encephalopathy   COPD (chronic obstructive pulmonary disease) ([name])   Hypertension   Pulmonary fibrosis ([place])   Hypercholesterolemia   DM type 2 causing vascular disease ([place])   Chronic diastolic CHF (congestive heart failure) ([name])   AF (paroxysmal atrial fibrillation) ([name])   Normocytic anemia   MRSA bacteremia   Acute kidney injury ([place])          Past Medical History:  Diagnosis Date  . Allergy    Rhinitis  . Atrial fibrillation  ([place])   . Bronchitis   . Chronic respiratory failure ([place])   . Colon polyps   . COPD (chronic obstructive pulmonary disease) ([place])   . Diabetes mellitus   . Elevated lipids   . Hypercholesterolemia   . Hypertension   . Iron deficiency anemia due to chronic blood loss 04/30/2018  . Noncompliance   . On home O2    2L N/C   . PSA elevation   . Pulmonary fibrosis ([place])   . Vitamin D deficiency          Past Surgical History:  Procedure Laterality Date  . BIOPSY  04/23/2018   Procedure: BIOPSY;  Surgeon: Danie Binder, MD;  [facility];  Service: Endoscopy;;  duodenum gastric  . CATARACT EXTRACTION W/PHACO  06/25/2012   Procedure: CATARACT EXTRACTION PHACO AND INTRAOCULAR LENS PLACEMENT (IOC);  Surgeon: Elta Guadeloupe T. Gershon Crane, MD;  Location: [facility];  Service: Ophthalmology;  Laterality: Left;  CDE=19.01  . CATARACT EXTRACTION W/PHACO  07/09/2012   Procedure: CATARACT EXTRACTION PHACO AND INTRAOCULAR LENS PLACEMENT (IOC);  Surgeon: Elta Guadeloupe T. Gershon Crane, MD;  Location: [facility];  Service: Ophthalmology;  Laterality: Right;  CDE: 20.09  . COLONOSCOPY WITH PROPOFOL N/A 04/23/2018   Procedure: COLONOSCOPY WITH PROPOFOL;  Surgeon: Danie Binder, MD;  [facility];  Service: Endoscopy;  Laterality: N/A;  . ESOPHAGOGASTRODUODENOSCOPY (EGD) WITH PROPOFOL N/A 04/23/2018   Procedure: ESOPHAGOGASTRODUODENOSCOPY (EGD) WITH PROPOFOL;  Surgeon: Danie Binder, MD;  [facility];  Service: Endoscopy;  Laterality: N/A;     HPI  from the history and physical done on the day of admission:    [name] a84 y.o.malehistory significant  forPAF (atrial fibrillation),pulmonary fibrosis,COPDon home oxygen,type IIdiabetes mellitus, hyperlipidemia, hypertension, iron deficiency anemia,history of noncompliance, PSA elevation,vitamin Dongoing hypoglycemia on documentable encephalopathy on 04/14/2020  -Patient is very confused and a poor  historian -Patient son tells me that patient's blood sugar readings have been reading high which means over 600 for the last couple of days -Patient's wife tells me that patient has been refusing to take insulin for the last couple days -In the ED his bicarb is 28 anion gap is 18 glucose over 500 -Patient received IV fluids and anion gap is down to 14--he remains confused and disoriented -Apparently no vomiting no diarrhea no fevers no chest pain     Hospital Course:     Brief Summary:- 84 y.o.malehistory significant forPAF (atrial fibrillation),pulmonary fibrosis,COPDon home oxygen,type IIdiabetes mellitus, hyperlipidemia, hypertension, iron deficiency anemia,history of noncompliance, PSA elevation,vitamin Dadmitted on 04/14/2020 with severe hyperglycemia with dehydration and AKI resulting in metabolic encephalopathy and found to have MRSA bacteremia  -  A/p 1)MRSA bacteremia--treated with IV vancomycin started on 04/16/2020 -Echo is suboptimal quality due to poor cooperation from patient, valves are not well visualized, EF is 60 to 65% (No definite vegetations were noted on TTE but he does have calcific thickening of the mitral and aortic valves and the TTE was of relatively poor quality) --Family declined TEE, family also reluctant to have PICC line for IV vancomycin due to concerns that patient's confusional episodes may lead him to pull out lines - dc home onp.o. Zyvox for 1 month -Infectious disease consult/input from Dr. Jenny Reichmann Campbellappreciated WBC 6.9 Microbiology results: 7/16BCx2:NGTD 7/16UCx: -Neg 7/15 BC x2: BCID= MRSA in 1 of 2 bottles -- Antimicrobials this admission: vancomycin7/16>> ceftriaxone 7/16>>7/16 -Discharge home on Zyvox/linezolid 600 mg twice daily for 1 month, get CBC and BMP every Monday for 5 weeks -  2)acute metabolic encephalopathy in the setting of severe hyperglycemia, dehydration and AKI and MRSA bacteremia--patient  was hydrated IV until oral intake improved,  -UA not suggestive of UTI Leukocytosis white count down to6.13fom 15.7  --Appears to be back to baseline   2)chronic hypoxic respiratory failure--continue supplemental oxygen- -- ABG w/o hypercapnia  -Clinically and radiologically no pneumonia -Continue home O2 at 2 L/min  3)AKI--due to dehydration and hyperglycemia, baseline creatinine is usually between 0.9 and 1, creatinine is 1.56 -Creatinine is down to 1.14 , renally adjust medications, avoid nephrotoxic agents / dehydration / hypotension Decrease Lasix to 20 mg daily okay to restart Metformin  4)PAFIb--stable, continue metoprolol for rate control, continue Eliquis for stroke prophylaxis  5)COPD-no acute exacerbation at this time, continue bronchodilators,   6)uncontrolled DM--A1c 8.9, PTA patient was on Lantus 30 units daily  --During this hospital stay patient was on Lantus insulin  20 units along with mealtime insulin and additional sliding scale as needed -Patient has difficulty following complex instructions -In an attempt to simplify patient's regimen we will discharge on Lantus 24 units daily without any sliding scale or short-acting mealtime insulin -Metformin 1 g twice daily as ordered  7)HyperNatremia- due to  free water deficit setting of normal saline infusions, -Resolved with improvement in oral intake -Sodium is down to 1434fm 156  8)Sepsis discussion----on admission patient met SIRS criteria, 2 SIRS criteria: HR 90's to 100' & a WBC of 12.7, patient had AKI and acute metabolic encephalopathy --Blood cultures later grew MRSA in 1 of 2 bottles raising strong possibility of MRSA bacteremia and sepsis  -On admission initially AKI--was deemed to be due to  dehydration in the setting of  poor oral intake (confused patient)  and severe hyperglycemia --On admission acute metabolic encephalopathy was initially deemed to be secondary to severe hyperglycemia,  dehydration and AKI -On admission leukocytosis was deemed to be possibly reactive due to severe hyperglycemia dehydration and AKI ----In retrospect patient met sepsis criteria on admission -Patient has MRSA bacteremia with concern for possible endocarditis  Disposition--discharge home with home health RN, home health PT and home health social worker to help improve compliance with medications and diet -Hopefully we can reduce patient's readmission rate   04/26/20 Patient is here today with his son for follow-up.  Compliance has been an issue for this patient.  I asked the patient how many units of insulin he is taking.  His son states that he is taking 24 units.  The patient belatedly states that he is taking 30 units.  Apparently some days the son supervises the insulin.  Other days the father self administers the insulin.  The hospital change the dose.  I definitely believe there is some confusion and potentially missed doses of insulin at home.  I next asked the patient how often he is taking the antibiotic.  Patient is unable to answer that question.  The son states once a day.  He is supposed to be on Zyvox twice daily for 1 month for MRSA bacteremia.  I emphasized this to the patient and the son today and also emphasized the importance of taking this medication.  He denies any fever or chills.  Son denies any confusion.  I next asked the patient how often he is taking his metformin.  He states that he is taking at 1000 mg which is the new dose however he is only taking it once a day.  I emphasized to the patient that the hospital had recommended he take this twice a day to better manage his blood sugars.  I believe that due to confusion, the patient is often not taking his medication correctly and may not be taking it at all.  I explained this to the patient's son.  I believe that the son needs to supervise his father's administration of insulin.  I explained that this would help reduce his risk of  rehospitalization.  Patient denies any cough or shortness of breath or chest pain or dysuria.  His lungs are actually clear to auscultation today.  He does request something for pain.  Given his recent encephalopathy I would not refill his pain medication at this time.  I stated that we will need to depend on Lyrica to help manage his postherpetic neuralgia from now on and avoid opiates.  At that time, my plan was: I believe his initial hospitalization was due to his uncontrolled diabetes due to medication noncompliance.  I believe he likely require bacteremia around that hospital admission prompting his second hospitalization.  Therefore I believe if we can control his sugars we can help prevent readmissions like this in the future.  I spent more than 30 minutes today with the patient and his son explaining the importance of compliance and supervision of insulin.  Therefore of asked that his son administer the insulin 24 units every morning.  Of asked him to record the fasting blood sugars in the morning as well.  He is to take metformin twice a day 1000 mg.  He is to take Zyvox twice a day for 1 month total.  I will check a CBC and a CMP today and then weekly until he completes the antibiotics.  Strongly stressed the need for compliance.  Also respectfully asked the son to supervise medications to avoid accidents in administration or missed doses  05/03/20 Patient is here today for follow-up.  His son brings his blood sugars with him.  Fasting blood sugars range from 116-213.  The last 3 days his blood sugars have been 180 190 and 213.  The first 4 days were between 116 and 130.  The patient states that he has very little appetite.  He believes the antibiotic is affecting his appetite making him feel nauseated.  He denies any fevers or chills or other signs of systemic illness.  He is still taking 24 units of Basaglar daily and his son is monitoring this.  He is here today for lab work as well to monitor for  toxicity from the Zyvox.  At that time, my plan was: Check CMP and CBC as planned due to Zyvox.  We will continue to check these weekly until he is off the Zyvox which will require a total of 1 month of therapy.  He is on week 2.  Blood sugars are still slightly elevated.  Increase Basaglar to 30 units a day.  Patient is currently rate controlled and appropriately anticoagulated.  Reassess in 1 week.  05/14/20 Wt Readings from Last 3 Encounters:  05/14/20 161 lb (73 kg)  05/03/20 161 lb (73 kg)  04/26/20 161 lb (73 kg)   I received a call earlier this week from his nurse who was drawing his lab work.  She stated that he was not wearing his oxygen.  She also stated that his weight was increased.  Today on exam his weight is exactly the same as his last visit.  He does not appear fluid overloaded.  He has no pitting edema in his legs.  However he is wheezing more than normal on his physical exam.  When I asked him about his compliance with his inhalers he states that he is using his medicine.  I asked which 1 he is using.  He states that he is on a "red one" that he uses 3 times a day.  He states that he takes a "blue one" 2 or 3 times a day.  He is not sure which medication he takes and cannot provide the names for me.  His son states that he is taking Basaglar 30 units a day.  His blood sugars for the last week have been between 133 and 300 with the vast majority between 180 and 260.  Patient also reports frequent urination.  He states that he frequently has urge incontinence at night.  He also has urge incontinence during the day.  Often he will have urinary incontinence without even feeling the need to go to the restroom.  He reports nocturia frequently and also a weak stream at times.  He denies any dysuria Past Medical History:  Diagnosis Date  . Allergy    Rhinitis  . Atrial fibrillation ([place])   . Bronchitis   . Chronic respiratory failure ([place])   . Colon polyps   . COPD (chronic obstructive  pulmonary disease) ([place])   . Diabetes mellitus   . Elevated lipids   . Hypercholesterolemia   . Hypertension   . Iron deficiency anemia due to chronic blood loss 04/30/2018  . Noncompliance   . On home O2    2L N/C   . PSA elevation   . Pulmonary fibrosis ([place])   . Vitamin D deficiency    Past  Surgical History:  Procedure Laterality Date  . BIOPSY  04/23/2018   Procedure: BIOPSY;  Surgeon: [place] Bali, MD;  [facility];  Service: Endoscopy;;  duodenum gastric  . CATARACT EXTRACTION W/PHACO  06/25/2012   Procedure: CATARACT EXTRACTION PHACO AND INTRAOCULAR LENS PLACEMENT (IOC);  Surgeon: Loraine Leriche T. Nile Riggs, MD;  Location: [facility];  Service: Ophthalmology;  Laterality: Left;  CDE=19.01  . CATARACT EXTRACTION W/PHACO  07/09/2012   Procedure: CATARACT EXTRACTION PHACO AND INTRAOCULAR LENS PLACEMENT (IOC);  Surgeon: Loraine Leriche T. Nile Riggs, MD;  Location: [facility];  Service: Ophthalmology;  Laterality: Right;  CDE: 20.09  . COLONOSCOPY WITH PROPOFOL N/A 04/23/2018   Procedure: COLONOSCOPY WITH PROPOFOL;  Surgeon: [place] Bali, MD;  [facility];  Service: Endoscopy;  Laterality: N/A;  . ESOPHAGOGASTRODUODENOSCOPY (EGD) WITH PROPOFOL N/A 04/23/2018   Procedure: ESOPHAGOGASTRODUODENOSCOPY (EGD) WITH PROPOFOL;  Surgeon: [place] Bali, MD;  [facility];  Service: Endoscopy;  Laterality: N/A;   Current Outpatient Medications on File Prior to Visit  Medication Sig Dispense Refill  . acetaminophen (TYLENOL) 500 MG tablet Take 500 mg by mouth every 6 (six) hours as needed for headache.    . albuterol (PROVENTIL) (2.5 MG/3ML) 0.083% nebulizer solution INHALE 1 VIAL VIA NEBULIZER EVERY 6 HOURS AS NEEDED FOR WHEEZING OR SHORTNESS OF BREATH 360 mL 4  . budesonide-formoterol (SYMBICORT) 160-4.5 MCG/ACT inhaler Inhale 2 puffs into the lungs 2 (two) times daily. 1 Inhaler 3  . cloNIDine (CATAPRES) 0.1 MG tablet Take 1 tablet (0.1 mg total) by mouth daily.    Marland Kitchen ELIQUIS 5 MG TABS  tablet Take 1 tablet by mouth twice daily 60 tablet 11  . furosemide (LASIX) 20 MG tablet Take 1 tablet (20 mg total) by mouth daily. 30 tablet 1  . Insulin Glargine (BASAGLAR KWIKPEN) 100 UNIT/ML Inject 0.24 mLs (24 Units total) into the skin at bedtime. 10 pen 3  . ipratropium-albuterol (DUONEB) 0.5-2.5 (3) MG/3ML SOLN Take 3 mLs by nebulization every 6 (six) hours as needed. 360 mL 1  . linezolid (ZYVOX) 600 MG tablet Take 1 tablet (600 mg total) by mouth 2 (two) times daily. 60 tablet 0  . metFORMIN (GLUCOPHAGE) 1000 MG tablet Take 1 tablet (1,000 mg total) by mouth 2 (two) times daily with a meal. (Patient taking differently: Take 500 mg by mouth 2 (two) times daily with a meal. ) 60 tablet 11  . metoprolol tartrate (LOPRESSOR) 50 MG tablet Take 1 tablet (50 mg total) by mouth 2 (two) times daily. 60 tablet 2  . OXYGEN Inhale 3 L into the lungs continuous.     . potassium chloride SA (KLOR-CON) 20 MEQ tablet Take 1 tablet (20 mEq total) by mouth daily. 30 tablet 1  . pravastatin (PRAVACHOL) 80 MG tablet TAKE 1 TABLET BY MOUTH AT BEDTIME 30 tablet 3  . pregabalin (LYRICA) 100 MG capsule TAKE 1 CAPSULE BY MOUTH TWICE DAILY. STOP GABAPENTIN 60 capsule 0   No current facility-administered medications on file prior to visit.   Allergies  Allergen Reactions  . Ace Inhibitors Other (See Comments)    Hyperkalemia--07/23/2013:patient states not familiar with the following allergy   Social History   Socioeconomic History  . Marital status: Married    Spouse name: Not on file  . Number of children: Not on file  . Years of education: Not on file  . Highest education level: Not on file  Occupational History  . 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 Smoking status: Former Smoker    Packs/day: 1.50    Years: 60.00    Pack years: 90.00    Types: Cigarettes    Quit date: 12/31/2012    Years since quitting: 7.3  . Smokeless tobacco: Never Used  Vaping Use  .  Vaping Use: Never used  Substance and Sexual Activity  . Alcohol use: No  . Drug use: No  . Sexual activity: Yes    Birth control/protection: None  Other Topics Concern  . Not on file  Social History Narrative  . Not on file   Social Determinants of Health   Financial Resource Strain: Low Risk   . Difficulty of Paying Living Expenses: Not very hard  Food Insecurity:   . Worried About Charity fundraiser in the Last Year:   . Arboriculturist in the Last Year:   Transportation Needs: No Transportation Needs  . Lack of Transportation (Medical): No  . Lack of Transportation (Non-Medical): No  Physical Activity: Inactive  . Days of Exercise per Week: 0 days  . Minutes of Exercise per Session: 0 min  Stress: No Stress Concern Present  . Feeling of Stress : Only a little  Social Connections:   . Frequency of Communication with Friends and Family:   . Frequency of Social Gatherings with Friends and Family:   . Attends Religious Services:   . Active Member of Clubs or Organizations:   . Attends Archivist Meetings:   Marland Kitchen Marital Status:   Intimate Partner Violence:   . Fear of Current or Ex-Partner:   . Emotionally Abused:   Marland Kitchen Physically Abused:   . Sexually Abused:      Review of Systems  All other systems reviewed and are negative.      Objective:   Physical Exam Constitutional:      General: He is not in acute distress.    Appearance: He is not ill-appearing or toxic-appearing.  Cardiovascular:     Rate and Rhythm: Normal rate and regular rhythm.     Heart sounds: Normal heart sounds.  Pulmonary:     Effort: Pulmonary effort is normal.     Breath sounds: Decreased air movement present. Wheezing present. No rales.  Chest:     Chest wall: No tenderness.  Abdominal:     General: Bowel sounds are normal. There is no distension.     Palpations: Abdomen is soft.     Tenderness: There is no abdominal tenderness.  Musculoskeletal:     Right lower leg: No  edema.     Left lower leg: No edema.  Skin:    Findings: No erythema or rash.  Neurological:     Mental Status: He is alert.           Assessment & Plan:  Benign localized prostatic hyperplasia with lower urinary tract symptoms (LUTS) - Plan: CBC with Differential/Platelet, BASIC METABOLIC PANEL WITH GFR, PSA  Bacteremia due to Staphylococcus  Uncontrolled type 2 diabetes mellitus with hyperglycemia ([name])  Chronic obstructive pulmonary disease, unspecified COPD type ([place])  I do not believe that the patient is being compliant with his inhalers at home.  Therefore I have asked the son to go home and call us back immediately today and let us know exactly which inhalers he is using.  If he is not on a long-acting bronchodilator I will have the patient begin Symbicort 2 puffs inhaled twice daily as he is instructed to do.  If he has been compliant with Symbicort, given the worsening situation with his breathing, I would start the patient on a low-dose steroid to avoid hospitalization due to hypoxia.  Increase Basaglar to 40 units a day.  Check CBC and BMP to monitor for any electrolyte disturbances on Zyvox.  Begin Flomax 0.4 mg p.o. nightly for lower urinary tract symptoms

## 2020-05-17 ENCOUNTER — Other Ambulatory Visit: Payer: Self-pay | Admitting: Family Medicine

## 2020-05-17 DIAGNOSIS — I0981 Rheumatic heart failure: Secondary | ICD-10-CM | POA: Diagnosis not present

## 2020-05-17 DIAGNOSIS — I5032 Chronic diastolic (congestive) heart failure: Secondary | ICD-10-CM

## 2020-05-17 DIAGNOSIS — I5033 Acute on chronic diastolic (congestive) heart failure: Secondary | ICD-10-CM | POA: Diagnosis not present

## 2020-05-17 DIAGNOSIS — Z09 Encounter for follow-up examination after completed treatment for conditions other than malignant neoplasm: Secondary | ICD-10-CM

## 2020-05-17 DIAGNOSIS — I130 Hypertensive heart and chronic kidney disease with heart failure and stage 1 through stage 4 chronic kidney disease, or unspecified chronic kidney disease: Secondary | ICD-10-CM | POA: Diagnosis not present

## 2020-05-17 DIAGNOSIS — R600 Localized edema: Secondary | ICD-10-CM

## 2020-05-17 DIAGNOSIS — N183 Chronic kidney disease, stage 3 unspecified: Secondary | ICD-10-CM | POA: Diagnosis not present

## 2020-05-17 DIAGNOSIS — E1122 Type 2 diabetes mellitus with diabetic chronic kidney disease: Secondary | ICD-10-CM | POA: Diagnosis not present

## 2020-05-24 ENCOUNTER — Encounter: Payer: Self-pay | Admitting: Family Medicine

## 2020-05-24 DIAGNOSIS — N183 Chronic kidney disease, stage 3 unspecified: Secondary | ICD-10-CM | POA: Diagnosis not present

## 2020-05-24 DIAGNOSIS — I0981 Rheumatic heart failure: Secondary | ICD-10-CM | POA: Diagnosis not present

## 2020-05-24 DIAGNOSIS — E1122 Type 2 diabetes mellitus with diabetic chronic kidney disease: Secondary | ICD-10-CM | POA: Diagnosis not present

## 2020-05-24 DIAGNOSIS — I130 Hypertensive heart and chronic kidney disease with heart failure and stage 1 through stage 4 chronic kidney disease, or unspecified chronic kidney disease: Secondary | ICD-10-CM | POA: Diagnosis not present

## 2020-05-24 DIAGNOSIS — I5033 Acute on chronic diastolic (congestive) heart failure: Secondary | ICD-10-CM | POA: Diagnosis not present

## 2020-05-28 ENCOUNTER — Other Ambulatory Visit: Payer: Self-pay | Admitting: *Deleted

## 2020-05-28 ENCOUNTER — Telehealth: Payer: Self-pay

## 2020-05-28 NOTE — Patient Outreach (Signed)
[facility]) Care Management  05/28/2020  Thunder SHELTON SQUARE Feb 25, 1935 [id number]   Telephone Assessment-Successful  RN spoke with pt today and received an update on pt's ongoing management of care. Pt denies any encountered symptoms of his COPD verifying all inhalers and nebulizer therapies. States no issues with his breathing and confirms usage of his COPD medications. States he has only one Symbicort inhaler. Pt states it "comes in the mail". RN inquired to check the packaging on where to reorder the medication. Also informed the pt that the system list the medications coming from [facility] in [place] if he needs this medication to be filled immediately.   Reports his continues to administer the prescribed lower dosage of insulin with glucose readings between 150-170. Will continue to assist with needed education. MD aware.  Plan of care discussed and updated as pt remains on trach with his COPD. Will follow up next month on pt's ongoing progress.  Goals Addressed            This Visit's Progress   . COPD   On track    CARE PLAN ENTRY  Current Barriers:  . Chronic Disease Management support, education, and care coordination needs related to COPD  Pharmacist Clinical Goal(s):  Marland Kitchen Over the next 30 days patient will work with PhamD to optimize medication related to COPD.  Interventions: . Comprehensive medication review performed.  Patient Self Care Activities:  . Over the next 30 days patient is to report any increase in symptoms to PharmD or PCP.  Initial goal documentation        Raina Mina, RN Care Management Coordinator [place] Office [phone]

## 2020-05-28 NOTE — Telephone Encounter (Signed)
What pills are they talking about?   (pain, etc)

## 2020-05-28 NOTE — Telephone Encounter (Signed)
Mr. Besson son called, Mr. Revin wanted him to call and let his Provider know his pills that were prescribed aren't doing him any good. Wants to know if he could have something stronger?

## 2020-06-02 ENCOUNTER — Other Ambulatory Visit: Payer: Self-pay

## 2020-06-02 DIAGNOSIS — E1122 Type 2 diabetes mellitus with diabetic chronic kidney disease: Secondary | ICD-10-CM | POA: Diagnosis not present

## 2020-06-02 DIAGNOSIS — I0981 Rheumatic heart failure: Secondary | ICD-10-CM | POA: Diagnosis not present

## 2020-06-02 DIAGNOSIS — N183 Chronic kidney disease, stage 3 unspecified: Secondary | ICD-10-CM | POA: Diagnosis not present

## 2020-06-02 DIAGNOSIS — I5032 Chronic diastolic (congestive) heart failure: Secondary | ICD-10-CM

## 2020-06-02 DIAGNOSIS — J449 Chronic obstructive pulmonary disease, unspecified: Secondary | ICD-10-CM | POA: Diagnosis not present

## 2020-06-02 DIAGNOSIS — I130 Hypertensive heart and chronic kidney disease with heart failure and stage 1 through stage 4 chronic kidney disease, or unspecified chronic kidney disease: Secondary | ICD-10-CM | POA: Diagnosis not present

## 2020-06-02 DIAGNOSIS — I5033 Acute on chronic diastolic (congestive) heart failure: Secondary | ICD-10-CM | POA: Diagnosis not present

## 2020-06-02 DIAGNOSIS — R600 Localized edema: Secondary | ICD-10-CM

## 2020-06-02 DIAGNOSIS — Z09 Encounter for follow-up examination after completed treatment for conditions other than malignant neoplasm: Secondary | ICD-10-CM

## 2020-06-02 MED ORDER — FUROSEMIDE 20 MG PO TABS: 20.0000 mg | ORAL_TABLET | Freq: Every day | ORAL | 1 refills | Status: DC

## 2020-06-09 NOTE — Telephone Encounter (Signed)
Son never clarified what medication Mr. Glazebrook was referring to.

## 2020-06-21 ENCOUNTER — Other Ambulatory Visit: Payer: Self-pay | Admitting: Family Medicine

## 2020-06-21 DIAGNOSIS — I5032 Chronic diastolic (congestive) heart failure: Secondary | ICD-10-CM

## 2020-06-21 DIAGNOSIS — Z09 Encounter for follow-up examination after completed treatment for conditions other than malignant neoplasm: Secondary | ICD-10-CM

## 2020-06-21 DIAGNOSIS — R600 Localized edema: Secondary | ICD-10-CM

## 2020-06-22 ENCOUNTER — Telehealth: Payer: Self-pay

## 2020-06-22 NOTE — Telephone Encounter (Signed)
Edward [name] would like to know if you could prescribe him something for the pain in his arm?

## 2020-06-24 ENCOUNTER — Other Ambulatory Visit: Payer: Self-pay | Admitting: Family Medicine

## 2020-06-24 MED ORDER — TRAMADOL HCL 50 MG PO TABS: 50.0000 mg | ORAL_TABLET | Freq: Three times a day (TID) | ORAL | 0 refills | Status: DC | PRN

## 2020-06-24 NOTE — Telephone Encounter (Signed)
That is why he is on lyrica.  Is he taking lyrica?  We could add tramadol and I will send in rx.

## 2020-06-25 ENCOUNTER — Other Ambulatory Visit: Payer: Self-pay

## 2020-06-25 ENCOUNTER — Other Ambulatory Visit: Payer: Self-pay | Admitting: Family Medicine

## 2020-06-25 DIAGNOSIS — R600 Localized edema: Secondary | ICD-10-CM

## 2020-06-25 DIAGNOSIS — Z09 Encounter for follow-up examination after completed treatment for conditions other than malignant neoplasm: Secondary | ICD-10-CM

## 2020-06-25 DIAGNOSIS — I5032 Chronic diastolic (congestive) heart failure: Secondary | ICD-10-CM

## 2020-06-25 MED ORDER — PREGABALIN 100 MG PO CAPS: ORAL_CAPSULE | ORAL | 2 refills | Status: DC

## 2020-06-25 NOTE — Telephone Encounter (Signed)
Rx was sent in for Pt, Pt also is taking lyrica, but didn't have a refill so refill has been routed to his provider.

## 2020-06-29 ENCOUNTER — Other Ambulatory Visit: Payer: Self-pay | Admitting: *Deleted

## 2020-06-29 NOTE — Addendum Note (Signed)
Addended by: Raina Mina D on: 06/29/2020 05:27 PM   Modules accepted: Orders

## 2020-06-29 NOTE — Patient Outreach (Signed)
[facility]) Care Management  06/29/2020  Edward [name] Aug 02, 1935 [id number]   Telephone Assessment-Successful-Diabetes  RN spoke with pt who was able to provide some information concerning his ongoing progress however pt request RN to speak with his son [name]) on the specific readings on his glucose readings. Reports ranges from 130-180 fasting prior to breakfast.  Reports pt has been adherent with all medical appointments and most of his medications however states pt has stopped taking his Eliquis due to the cost. RN inquired if his provider was aware as caregiver son indicates "he believes so" stating the previous visiting [place] agency was aware and indicated they would report this information to Dr. Dennard Schaumann. RN offered to follow up and notify Dr. Dennard Schaumann once again with this information. In addition to a [facility] referral for pharmacy assistance (receptive). Explained the importance of notifying his provider on any medications before ending any dosage.  Further discussion on the last A1c at 8.9 7/14 pending an updated reading. Continue to stress the importance of reducing the pt's overall blood glucose readings that will reflect his overall A1C with the normal being <6. Goal is to reduce 1-2 pionts if not more over the few months based upon pt's adherence to a healthy version of high protein and low carbohydrate dietary habits.   Verified pt's continue to do well managing his ongoing COPD with no acute issues to address at this time. Will update pt's provider concerning pt's disposition with [facility] services and follow up next month concerning the plan of care discussed today.     Goals Addressed            This Visit's Progress   . Monitor and Manage My Blood Sugar       Follow Up Date 07/29/2020   - check blood sugar at prescribed times - enter blood sugar readings and medication or insulin into daily log    Why is this important?   Checking your blood sugar at home  helps to keep it from getting very high or very low.  Writing the results in a diary or log helps the doctor know how to care for you.  Your blood sugar log should have the time, date and the results.  Also, write down the amount of insulin or other medicine that you take.  Other information, like what you ate, exercise done and how you were feeling, will also be helpful.     Notes: Stress the importance of logging all readings prior to his initial meal (fasting)      . Set My Target A1C       Follow Up Date 07/29/2020   - set target A1C-   Why is this important?   Your target A1C is decided together by you and your doctor.  It is based on several things like your age and other health issues.    Notes: Discussed the importance of lowering the A1C.       Raina Mina, RN Care Management Coordinator [place] [facility] [phone]

## 2020-06-29 NOTE — Patient Outreach (Signed)
Referral from Raina Mina, RN to [place] for medication assistance.

## 2020-06-30 NOTE — Patient Outreach (Signed)
Referral sent to [email or web address] for Pharmacy assistance.

## 2020-07-01 ENCOUNTER — Other Ambulatory Visit: Payer: Self-pay | Admitting: Family Medicine

## 2020-07-02 ENCOUNTER — Telehealth: Payer: Self-pay | Admitting: Pharmacist

## 2020-07-02 DIAGNOSIS — J449 Chronic obstructive pulmonary disease, unspecified: Secondary | ICD-10-CM | POA: Diagnosis not present

## 2020-07-02 NOTE — Chronic Care Management (AMB) (Addendum)
Tried to call Mr. Dickerman to discuss addition of Losartan as well as patient assistance for Eliquis following up on [facility] referral.  He reportedly has stopped taking this medication due to cost.  No answer and voicemail is full.  Beverly Milch, PharmD Clinical Pharmacist [place] [phone] I have collaborated with the care management provider regarding care management and care coordination activities outlined in this encounter and have reviewed this encounter including documentation in the note and care plan. I am certifying that I agree with the content of this note and encounter as supervising physician.

## 2020-07-05 ENCOUNTER — Other Ambulatory Visit: Payer: Self-pay | Admitting: Family Medicine

## 2020-07-12 ENCOUNTER — Other Ambulatory Visit: Payer: Self-pay | Admitting: Family Medicine

## 2020-07-12 DIAGNOSIS — I10 Essential (primary) hypertension: Secondary | ICD-10-CM

## 2020-07-12 NOTE — Telephone Encounter (Signed)
received fax requesting a refill on metoprolol tart 50 mg tab QTY: 50  SIg: Take 1 tablet by mouth twice daily

## 2020-07-14 MED ORDER — METOPROLOL TARTRATE 25 MG PO TABS: 25.0000 mg | ORAL_TABLET | Freq: Two times a day (BID) | ORAL | 3 refills | Status: DC

## 2020-07-15 MED ORDER — METOPROLOL TARTRATE 25 MG PO TABS: 25.0000 mg | ORAL_TABLET | Freq: Two times a day (BID) | ORAL | 3 refills | Status: DC

## 2020-07-15 NOTE — Addendum Note (Signed)
Addended by: Sheral Flow on: 07/15/2020 11:36 AM   Modules accepted: Orders

## 2020-07-15 NOTE — Telephone Encounter (Signed)
Medication decreased while in hospital.   D/C Toprol 50.

## 2020-07-16 ENCOUNTER — Telehealth: Payer: Self-pay | Admitting: Pharmacist

## 2020-07-16 ENCOUNTER — Other Ambulatory Visit: Payer: Self-pay

## 2020-07-16 ENCOUNTER — Ambulatory Visit: Payer: Medicare Other | Admitting: Pharmacist

## 2020-07-16 DIAGNOSIS — I10 Essential (primary) hypertension: Secondary | ICD-10-CM

## 2020-07-16 DIAGNOSIS — E1140 Type 2 diabetes mellitus with diabetic neuropathy, unspecified: Secondary | ICD-10-CM

## 2020-07-16 NOTE — Patient Instructions (Addendum)
Visit Information  Goals Addressed            This Visit's Progress   . High Blood Pressure - < 130/80       CARE PLAN ENTRY (see longitudinal plan of care for additional care plan information)  Current Barriers:  . Uncontrolled hypertension, complicated by hyperlipidemia, Type II diabetes, and COPD. . Current antihypertensive regimen: clonidine 0.1mg  twice daily, metoprolol 25mg  twice daily . Previous antihypertensives tried: diltiazem 240mg  ER, quinapril 20mg  . Last practice recorded BP readings:  BP Readings from Last 3 Encounters:  05/14/20 (!) 120/58  05/03/20 (!) 120/50  04/26/20 114/66     Pharmacist Clinical Goal(s):  Marland Kitchen Over the next 90 days, patient will work with PharmD and providers to optimize antihypertensive regimen  Interventions: . Inter-disciplinary care team collaboration (see longitudinal plan of care) . Comprehensive medication review performed; medication list updated in the electronic medical record.  . Add losartan 25mg  daily to help kidney function  Patient Self Care Activities:  . Patient will continue to check BP as he is able , document, and provide at future appointments . Patient will focus on medication adherence by pill count. . Over the next 90 days if patient check BP and if> 130/80 patient is to contact PCP or PharmD, also contact for low BP or HR.  Please see past updates related to this goal by clicking on the "Past Updates" button in the selected goal      . Type II Diabetes - A1c < 7%       CARE PLAN ENTRY (see longitudinal plan of care for additional care plan information)  Current Barriers:  . Type II diabetes complicated by chronic medical conditions including hyperlipidemia, hypertension, and COPD. Lab Results  Component Value Date   HGBA1C 8.9 (H) 04/14/2020 .   Lab Results  Component Value Date   CREATININE 1.15 (H) 05/14/2020   CREATININE 1.33 (H) 05/03/2020   CREATININE 1.42 (H) 04/26/2020   . Current  antihyperglycemic regimen: Metformin 1000mg  twice daily, Basaglar 100u/ml 30 units hs . Denies hypoglycemic symptoms, including dizziness, lightheadedness, shaking, sweating . 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 . Current meal patterns: o Same . Current exercise: none . Current blood glucose readings: FBG 150s-200s  Pharmacist Clinical Goal(s):  Marland Kitchen Over the next 90 days, patient will work with PharmD and primary care provider to address hyperglycemia and elevated A1c.  Interventions: . Comprehensive medication review performed, medication list updated in electronic medical record . Inter-disciplinary care team collaboration (see longitudinal plan of care) . Check blood sugar nightly a couple times per week and record in log until next meeting with PharmD. Marland Kitchen Patient approved for temporary 90 day supply of Jardiance, will follow up on where the rest is.  Patient Self Care Activities:  . Patient will check blood glucose once or twice daily, document, and provide at future appointments . Patient will focus on medication adherence by pill count . Patient will take medications as prescribed . Patient will contact provider with any episodes of hypoglycemia . Patient will report any questions or concerns to provider  . Over the next 90 days patient will try to check blood sugar and additional time daily, preferably night time 2 hours after eating dinner and report consistent readings > 150 to PharmD or PCP. Marland Kitchen Restart taking Jardiance 10mg  once daily and continue to monitor blood sugar, report and < 70 to PharmD.  Please see past updates related to this goal by clicking on  the "Past Updates" button in the selected goal         The patient verbalized understanding of instructions provided today and agreed to receive a mailed copy of patient instruction and/or educational materials.  Telephone follow up appointment with pharmacy team member scheduled for: 3 months  Beverly Milch,  PharmD Clinical Pharmacist [place] [facility] [phone]   Diabetic Neuropathy Diabetic neuropathy refers to nerve damage that is caused by diabetes (diabetes mellitus). Over time, people with diabetes can develop nerve damage throughout the body. There are several types of diabetic neuropathy:  Peripheral neuropathy. This is the most common type of diabetic neuropathy. It causes damage to nerves that carry signals between the spinal cord and other parts of the body (peripheral nerves). This usually affects nerves in the feet and legs first, and may eventually affect the hands and arms. The damage affects the ability to sense touch or temperature.  Autonomic neuropathy. This type causes damage to nerves that control involuntary functions (autonomic nerves). These nerves carry signals that control: ? Heartbeat. ? Body temperature. ? Blood pressure. ? Urination. ? Digestion. ? Sweating. ? Sexual function. ? Response to changing blood sugar (glucose) levels.  Focal neuropathy. This type of nerve damage affects one area of the body, such as an arm, a leg, or the face. The injury may involve one nerve or a small group of nerves. Focal neuropathy can be painful and unpredictable, and occurs most often in older adults with diabetes. This often develops suddenly, but usually improves over time and does not cause long-term problems.  Proximal neuropathy. This type of nerve damage affects the nerves of the thighs, hips, buttocks, or legs. It causes severe pain, weakness, and muscle death (atrophy), usually in the thigh muscles. It is more common among older men and people who have type 2 diabetes. The length of recovery time may vary. What are the causes? Peripheral, autonomic, and focal neuropathies are caused by diabetes that is not well controlled with treatment. The cause of proximal neuropathy is not known, but it may be caused by inflammation related to uncontrolled blood glucose  levels. What are the signs or symptoms? Peripheral neuropathy Peripheral neuropathy develops slowly over time. When the nerves of the feet and legs no longer work, you may experience:  Burning, stabbing, or aching pain in the legs or feet.  Pain or cramping in the legs or feet.  Loss of feeling (numbness) and inability to feel pressure or pain in the feet. This can lead to: ? Thick calluses or sores on areas of constant pressure. ? Ulcers. ? Reduced ability to feel temperature changes.  Foot deformities.  Muscle weakness.  Loss of balance or coordination. Autonomic neuropathy The symptoms of autonomic neuropathy vary depending on which nerves are affected. Symptoms may include:  Problems with digestion, such as: ? Nausea or vomiting. ? Poor appetite. ? Bloating. ? Diarrhea or constipation. ? Trouble swallowing. ? Losing weight without trying to.  Problems with the heart, blood and lungs, such as: ? Dizziness, especially when standing up. ? Fainting. ? Shortness of breath. ? Irregular heartbeat.  Bladder problems, such as: ? Trouble starting or stopping urination. ? Leaking urine. ? Trouble emptying the bladder. ? Urinary tract infections (UTIs).  Problems with other body functions, such as: ? Sweat. You may sweat too much or too little. ? Temperature. You might get hot easily. Or, you might feel cold more than usual. ? Sexual function. Men may not be able to  get or maintain an erection. Women may have vaginal dryness and difficulty with arousal. Focal neuropathy Symptoms affect only one area of the body. Common symptoms include:  Numbness.  Tingling.  Burning pain.  Prickling feeling.  Very sensitive skin.  Weakness.  Inability to move (paralysis).  Muscle twitching.  Muscles getting smaller (wasting).  Poor coordination.  Double or blurred vision. Proximal neuropathy  Sudden, severe pain in the hip, thigh, or buttocks. Pain may spread from the  back into the legs (sciatica).  Pain and numbness in the arms and legs.  Tingling.  Loss of bladder control or bowel control.  Weakness and wasting of thigh muscles.  Difficulty getting up from a seated position.  Abdominal swelling.  Unexplained weight loss. How is this diagnosed? Diagnosis usually involves reviewing your medical history and any symptoms you have. Diagnosis varies depending on the type of neuropathy your health care provider suspects. Peripheral neuropathy Your health care provider will check areas that are affected by your nervous system (neurologic exam), such as your reflexes, how you move, and what you can feel. You may have other tests, such as:  Blood tests.  Removal and examination of fluid that surrounds the spinal cord (lumbar puncture).  CT scan.  MRI.  A test to check the nerves that control muscles (electromyogram, EMG).  Tests of how quickly messages pass through your nerves (nerve conduction velocity tests).  Removal of a small piece of nerve to be examined under a microscope (biopsy). Autonomic neuropathy You may have tests, such as:  Tests to measure your blood pressure and heart rate. This may include monitoring you while you are safely secured to an exam table that moves you from a lying position to an upright position (table tilt test).  Breathing tests to check your lungs.  Tests to check how food moves through the digestive system (gastric emptying tests).  Blood, sweat, or urine tests.  Ultrasound of your bladder.  Spinal fluid tests. Focal neuropathy This condition may be diagnosed with:  A neurologic exam.  CT scan.  MRI.  EMG.  Nerve conduction velocity tests. Proximal neuropathy There is no test to diagnose this type of neuropathy. You may have tests to rule out other possible causes of this type of neuropathy. Tests may include:  X-rays of your spine and lumbar region.  Lumbar puncture.  MRI. How is this  treated? The goal of treatment is to keep nerve damage from getting worse. The most important part of treatment is keeping your blood glucose level and your A1C level within your target range by following your diabetes management plan. Over time, maintaining lower blood glucose levels helps lessen symptoms. In some cases, you may need prescription pain medicine. Follow these instructions at home:  Lifestyle   Do not use any products that contain nicotine or tobacco, such as cigarettes and e-cigarettes. If you need help quitting, ask your health care provider.  Be physically active every day. Include strength training and balance exercises.  Follow a healthy meal plan.  Work with your health care provider to manage your blood pressure. General instructions  Follow your diabetes management plan as directed. ? Check your blood glucose levels as directed by your health care provider. ? Keep your blood glucose in your target range as directed by your health care provider. ? Have your A1C level checked at least two times a year, or as often as told by your health care provider.  Take over the counter and prescription medicines  only as told by your health care provider. This includes insulin and diabetes medicine.  Do not drive or use heavy machinery while taking prescription pain medicines.  Check your skin and feet every day for cuts, bruises, redness, blisters, or sores.  Keep all follow up visits as told by your health care provider. This is important. Contact a health care provider if:  You have burning, stabbing, or aching pain in your legs or feet.  You are unable to feel pressure or pain in your feet.  You develop problems with digestion, such as: ? Nausea. ? Vomiting. ? Bloating. ? Constipation. ? Diarrhea. ? Abdominal pain.  You have difficulty with urination, such as inability: ? To control when you urinate (incontinence). ? To completely empty the bladder  (retention).  You have palpitations.  You feel dizzy, weak, or faint when you stand up. Get help right away if:  You cannot urinate.  You have sudden weakness or loss of coordination.  You have trouble speaking.  You have pain or pressure in your chest.  You have an irregular heart beat.  You have sudden inability to move a part of your body. Summary  Diabetic neuropathy refers to nerve damage that is caused by diabetes. It can affect nerves throughout the entire body, causing numbness and pain in the arms, legs, digestive tract, heart, and other body systems.  Keep your blood glucose level and your blood pressure in your target range, as directed by your health care provider. This can help prevent neuropathy from getting worse.  Check your skin and feet every day for cuts, bruises, redness, blisters, or sores.  Do not use any products that contain nicotine or tobacco, such as cigarettes and e-cigarettes. If you need help quitting, ask your health care provider. This information is not intended to replace advice given to you by your health care provider. Make sure you discuss any questions you have with your health care provider. Document Revised: 10/31/2017 Document Reviewed: 10/23/2016 Elsevier Patient Education  2020 [employer].

## 2020-07-16 NOTE — Chronic Care Management (AMB) (Addendum)
Chronic Care Management   Follow Up Note   07/16/2020 Name: Edward [name] MRN: [id number] DOB: Aug 22, 1935  Referred by: Susy Frizzle, MD Reason for referral : Chronic Care Management (PharmD follow up visit)   Edward [name] is a 85 y.o. year old male who is a primary care patient of [name], [name], MD. The CCM team was consulted for assistance with chronic disease management and care coordination needs.    Review of patient status, including review of consultants reports, relevant laboratory and other test results, and collaboration with appropriate care team members and the patient's provider was performed as part of comprehensive patient evaluation and provision of chronic care management services.    SDOH (Social Determinants of Health) assessments performed: No See Care Plan activities for detailed interventions related to [facility])      Outpatient Encounter Medications as of 07/16/2020  Medication Sig Note   acetaminophen (TYLENOL) 500 MG tablet Take 500 mg by mouth every 6 (six) hours as needed for headache.    albuterol (PROVENTIL) (2.5 MG/3ML) 0.083% nebulizer solution INHALE 1 VIAL VIA NEBULIZER EVERY 6 HOURS AS NEEDED FOR WHEEZING OR SHORTNESS OF BREATH    budesonide-formoterol (SYMBICORT) 160-4.5 MCG/ACT inhaler Inhale 2 puffs into the lungs 2 (two) times daily.    cloNIDine (CATAPRES) 0.1 MG tablet TAKE 1 TABLET BY MOUTH 2 TIMES A DAY    ELIQUIS 5 MG TABS tablet Take 1 tablet by mouth twice daily (Patient not taking: Reported on 06/29/2020)    furosemide (LASIX) 40 MG tablet TAKE 1 TABLET BY MOUTH DAILY    Insulin Glargine (BASAGLAR KWIKPEN) 100 UNIT/ML Inject 0.24 mLs (24 Units total) into the skin at bedtime. (Patient taking differently: Inject 30 Units into the skin at bedtime. )    ipratropium-albuterol (DUONEB) 0.5-2.5 (3) MG/3ML SOLN Take 3 mLs by nebulization every 6 (six) hours as needed.    Lancets (ONETOUCH DELICA PLUS [id number]) MISC USE TO CHECK BLOOD SUGAR  TWICE DAILY AS DIRECTED    linezolid (ZYVOX) 600 MG tablet Take 1 tablet (600 mg total) by mouth 2 (two) times daily.    metFORMIN (GLUCOPHAGE) 1000 MG tablet Take 1 tablet (1,000 mg total) by mouth 2 (two) times daily with a meal. (Patient taking differently: Take 500 mg by mouth 2 (two) times daily with a meal. ) 04/26/2020: 500mg  bid   metFORMIN (GLUCOPHAGE) 500 MG tablet TAKE 1 TABLET BY MOUTH TWICE DAILY WITH A MEAL    metoprolol tartrate (LOPRESSOR) 25 MG tablet Take 1 tablet (25 mg total) by mouth 2 (two) times daily.    ONETOUCH ULTRA test strip CHECK FASTING BLOOD SUGAR TWICE DAILY    OXYGEN Inhale 3 L into the lungs continuous.     potassium chloride SA (KLOR-CON) 20 MEQ tablet Take 1 tablet (20 mEq total) by mouth daily.    pravastatin (PRAVACHOL) 80 MG tablet TAKE 1 TABLET BY MOUTH AT BEDTIME    pregabalin (LYRICA) 100 MG capsule TAKE 1 CAPSULE BY MOUTH TWICE DAILY. STOP GABAPENTIN    tamsulosin (FLOMAX) 0.4 MG CAPS capsule Take 1 capsule (0.4 mg total) by mouth daily.    traMADol (ULTRAM) 50 MG tablet TAKE 1 TABLET BY MOUTH EVERY 8 HOURS AS NEEDED FOR  SHINGLES  PAIN    No facility-administered encounter medications on file as of 07/16/2020.     Objective:   Goals Addressed             This Visit's Progress    High Blood  Pressure - < 130/80       CARE PLAN ENTRY (see longitudinal plan of care for additional care plan information)  Current Barriers:  Uncontrolled hypertension, complicated by hyperlipidemia, Type II diabetes, and COPD. Current antihypertensive regimen: clonidine 0.1mg  twice daily, metoprolol 25mg  twice daily Previous antihypertensives tried: diltiazem 240mg  ER, quinapril 20mg  Last practice recorded BP readings:  BP Readings from Last 3 Encounters:  05/14/20 (!) 120/58  05/03/20 (!) 120/50  04/26/20 114/66     Pharmacist Clinical Goal(s):  Over the next 90 days, patient will work with PharmD and providers to optimize antihypertensive  regimen  Interventions: Inter-disciplinary care team collaboration (see longitudinal plan of care) Comprehensive medication review performed; medication list updated in the electronic medical record.  Add losartan 25mg  daily to help kidney function  Patient Self Care Activities:  Patient will continue to check BP as he is able , document, and provide at future appointments Patient will focus on medication adherence by pill count. Over the next 90 days if patient check BP and if> 130/80 patient is to contact PCP or PharmD, also contact for low BP or HR.  Please see past updates related to this goal by clicking on the "Past Updates" button in the selected goal       Type II Diabetes - A1c < 7%       CARE PLAN ENTRY (see longitudinal plan of care for additional care plan information)  Current Barriers:  Type II diabetes complicated by chronic medical conditions including hyperlipidemia, hypertension, and COPD. 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 Current meal patterns: Same Current exercise: none Current blood glucose readings: FBG 150s-200s  Pharmacist Clinical Goal(s):  Over the next 90 days, patient will work with PharmD and primary care provider to address hyperglycemia and elevated A1c.  Interventions: Comprehensive medication review performed, medication list updated in electronic medical record Inter-disciplinary care team collaboration (see longitudinal plan of care) Check blood sugar nightly a couple times per week and record in log until next meeting with PharmD. Patient approved for  temporary 90 day supply of Jardiance, will follow up on where the rest is.  Patient Self Care Activities:  Patient will check blood glucose once or twice daily, document, and provide at future appointments Patient will focus on medication adherence by pill count Patient will take medications as prescribed Patient will contact provider with any episodes of hypoglycemia Patient will report any questions or concerns to provider  Over the next 90 days patient will try to check blood sugar and additional time daily, preferably night time 2 hours after eating dinner and report consistent readings > 150 to PharmD or PCP. Restart taking Jardiance 10mg  once daily and continue to monitor blood sugar, report and < 70 to PharmD.  Please see past updates related to this goal by clicking on the "Past Updates" button in the selected goal         Diabetes   A1c goal <7%  Recent Relevant Labs: Lab Results  Component Value Date/Time   HGBA1C 8.9 (H) 04/14/2020 11:47 AM   HGBA1C 9.1 (H) 03/23/2020 05:05 PM   HGBA1C >14.0 (H) 12/01/2015 08:31  AM   HGBA1C >14.0 (H) 05/27/2015 08:54 AM   MICROALBUR 378.4 01/23/2020 03:09 PM   MICROALBUR 144.9 02/10/2019 09:31 AM    Last diabetic Eye exam:  Lab Results  Component Value Date/Time   [place] 05/14/2012 12:00 AM    Last diabetic Foot exam: No results found for: HMDIABFOOTEX   Checking BG: 2x per Day  Recent FBG Readings: 150-200  Patient has failed these meds in past: none noted Patient is currently uncontrolled on the following medications: Basaglar 100units/ml 30 units daily Metformin 500mg  twice daily with a meal  We discussed:  He was getting Jardiance from PAP but for some reason he cannot get this anymore.  Will follow up with program to see why this was d/c. Fasting sugars still elevated, however A1c trending in right direction. He denies hypoglycemia. Requests refill on Metformin. Needs to be on Jardiance due to  other comorbidities.  Plan  Continue current medications, follow up on Jardiance PAP through manufacturer.  Hypertension   BP goal is:  <130/80  Office blood pressures are  BP Readings from Last 3 Encounters:  05/14/20 (!) 120/58  05/03/20 (!) 120/50  04/26/20 114/66   Patient checks BP at home infrequently Patient home BP readings are ranging: no logs available  Patient has failed these meds in the past: ACEIs Patient is currently controlled on the following medications:  Clonidine 0.1mg  twice daily Metoprolol tartrate 25mg  twice daily  We discussed  Patient's son states sometimes his BP is elevated Last few office BP have been on the low side.  Patient showed microalbuminuria at [place] in April labs. -- he is not currently on ACE or ARB Have already consulted with Dr. Dennard Schaumann on adding losartan 25mg  daily, patient instructed he will need to monitor BP closely.  If it trends low would recommend to d/c clonidine.  Plan  Continue current medications, add losartan 25mg  daily will call Rx to [facility].    Beverly Milch, PharmD Clinical Pharmacist [place] [phone]    I have collaborated with the care management provider regarding care management and care coordination activities outlined in this encounter and have reviewed this encounter including documentation in the note and care plan. I am certifying that I agree with the content of this note and encounter as supervising physician.

## 2020-07-16 NOTE — Progress Notes (Signed)
Completed patient assistance application on behalf of the patient for Eliquis. Forwarding to CPP for review and patient completion in the office.  Fanny Skates, [place] Pharmacist Assistant [phone]

## 2020-07-19 ENCOUNTER — Telehealth: Payer: Self-pay | Admitting: *Deleted

## 2020-07-19 ENCOUNTER — Other Ambulatory Visit: Payer: Self-pay

## 2020-07-19 ENCOUNTER — Ambulatory Visit: Payer: Self-pay | Admitting: Pharmacist

## 2020-07-19 MED ORDER — METFORMIN HCL 500 MG PO TABS: ORAL_TABLET | ORAL | 0 refills | Status: DC

## 2020-07-19 NOTE — Telephone Encounter (Signed)
Prescription sent to pharmacy.

## 2020-07-19 NOTE — Telephone Encounter (Signed)
-----   Message from Edythe Clarity, [facility] sent at 07/16/2020  3:36 PM EDT ----- Marykay Lex!  Mr Bartnik has requested a refill on his Metformin be sent to [facility].  Thanks!

## 2020-07-19 NOTE — Telephone Encounter (Signed)
Duplicate request

## 2020-07-19 NOTE — Telephone Encounter (Signed)
Pt needing refill on metoprolol tartrate (LOPRESSOR) 25 MG tablet [place], [name] - [address]   Pt call back [phone]

## 2020-07-19 NOTE — Chronic Care Management (AMB) (Addendum)
Chronic Care Management   Follow Up Note   07/19/2020 Name: Edward [name] MRN: [id number] DOB: 07-10-1935  Referred by: Edward Frizzle, MD Reason for referral : No chief complaint on file.   Bentleigh ESPEN BETHEL is a 85 y.o. year old male who is a primary care patient of [name], [name], MD. The CCM team was consulted for assistance with chronic disease management and care coordination needs.    Review of patient status, including review of consultants reports, relevant laboratory and other test results, and collaboration with appropriate care team members and the patient's provider was performed as part of comprehensive patient evaluation and provision of chronic care management services.    SDOH (Social Determinants of Health) assessments performed: No See Care Plan activities for detailed interventions related to Edward [name])      Outpatient Encounter Medications as of 07/19/2020  Medication Sig Note   acetaminophen (TYLENOL) 500 MG tablet Take 500 mg by mouth every 6 (six) hours as needed for headache.    albuterol (PROVENTIL) (2.5 MG/3ML) 0.083% nebulizer solution INHALE 1 VIAL VIA NEBULIZER EVERY 6 HOURS AS NEEDED FOR WHEEZING OR SHORTNESS OF BREATH    budesonide-formoterol (SYMBICORT) 160-4.5 MCG/ACT inhaler Inhale 2 puffs into the lungs 2 (two) times daily.    cloNIDine (CATAPRES) 0.1 MG tablet TAKE 1 TABLET BY MOUTH 2 TIMES A DAY    ELIQUIS 5 MG TABS tablet Take 1 tablet by mouth twice daily (Patient not taking: Reported on 06/29/2020)    furosemide (LASIX) 40 MG tablet TAKE 1 TABLET BY MOUTH DAILY    Insulin Glargine (BASAGLAR KWIKPEN) 100 UNIT/ML Inject 0.24 mLs (24 Units total) into the skin at bedtime. (Patient taking differently: Inject 30 Units into the skin at bedtime. )    ipratropium-albuterol (DUONEB) 0.5-2.5 (3) MG/3ML SOLN Take 3 mLs by nebulization every 6 (six) hours as needed.    Lancets (ONETOUCH DELICA PLUS [id number]) MISC USE TO CHECK BLOOD SUGAR TWICE DAILY AS  DIRECTED    linezolid (ZYVOX) 600 MG tablet Take 1 tablet (600 mg total) by mouth 2 (two) times daily.    metFORMIN (GLUCOPHAGE) 1000 MG tablet Take 1 tablet (1,000 mg total) by mouth 2 (two) times daily with a meal. (Patient taking differently: Take 500 mg by mouth 2 (two) times daily with a meal. ) 04/26/2020: 500mg  bid   metFORMIN (GLUCOPHAGE) 500 MG tablet TAKE 1 TABLET BY MOUTH TWICE DAILY WITH A MEAL    metoprolol tartrate (LOPRESSOR) 25 MG tablet Take 1 tablet (25 mg total) by mouth 2 (two) times daily.    ONETOUCH ULTRA test strip CHECK FASTING BLOOD SUGAR TWICE DAILY    OXYGEN Inhale 3 L into the lungs continuous.     potassium chloride SA (KLOR-CON) 20 MEQ tablet Take 1 tablet (20 mEq total) by mouth daily.    pravastatin (PRAVACHOL) 80 MG tablet TAKE 1 TABLET BY MOUTH AT BEDTIME    pregabalin (LYRICA) 100 MG capsule TAKE 1 CAPSULE BY MOUTH TWICE DAILY. STOP GABAPENTIN    tamsulosin (FLOMAX) 0.4 MG CAPS capsule Take 1 capsule (0.4 mg total) by mouth daily.    traMADol (ULTRAM) 50 MG tablet TAKE 1 TABLET BY MOUTH EVERY 8 HOURS AS NEEDED FOR  SHINGLES  PAIN    No facility-administered encounter medications on file as of 07/19/2020.     Objective:  Contacted [place] [facility] for status of Symbicort delivery as patient is currently out of medication.  Goals Addressed   None  Refill was requested on 10/15 so should be shipped out 10/21 or 10/22.  Patient aware of upcoming delivery.  Plan:   The patient has been provided with contact information for the care management team and has been advised to call with any health related questions or concerns.    Beverly Milch, PharmD Clinical Pharmacist [place] [phone]   I have collaborated with the care management provider regarding care management and care coordination activities outlined in this encounter and have reviewed this encounter including documentation in the note and care plan. I am certifying that I  agree with the content of this note and encounter as supervising physician.

## 2020-07-22 ENCOUNTER — Other Ambulatory Visit: Payer: Self-pay | Admitting: Family Medicine

## 2020-07-22 DIAGNOSIS — R600 Localized edema: Secondary | ICD-10-CM

## 2020-07-22 DIAGNOSIS — I5032 Chronic diastolic (congestive) heart failure: Secondary | ICD-10-CM

## 2020-07-22 DIAGNOSIS — Z09 Encounter for follow-up examination after completed treatment for conditions other than malignant neoplasm: Secondary | ICD-10-CM

## 2020-07-24 ENCOUNTER — Other Ambulatory Visit: Payer: Self-pay | Admitting: Family Medicine

## 2020-07-27 ENCOUNTER — Other Ambulatory Visit: Payer: Self-pay | Admitting: Family Medicine

## 2020-07-27 ENCOUNTER — Ambulatory Visit: Payer: Self-pay | Admitting: Pharmacist

## 2020-07-27 NOTE — Chronic Care Management (AMB) (Addendum)
Chronic Care Management   Follow Up Note   07/27/2020 Name: Edward [name] MRN: [id number] DOB: 1934/10/08  Referred by: Susy Frizzle, MD Reason for referral : No chief complaint on file.   Edward [name] is a 85 y.o. year old male who is a primary care patient of [name], [name], MD. The CCM team was consulted for assistance with chronic disease management and care coordination needs.    Review of patient status, including review of consultants reports, relevant laboratory and other test results, and collaboration with appropriate care team members and the patient's provider was performed as part of comprehensive patient evaluation and provision of chronic care management services.    SDOH (Social Determinants of Health) assessments performed: No See Care Plan activities for detailed interventions related to [facility])      Outpatient Encounter Medications as of 07/27/2020  Medication Sig   acetaminophen (TYLENOL) 500 MG tablet Take 500 mg by mouth every 6 (six) hours as needed for headache.   albuterol (PROVENTIL) (2.5 MG/3ML) 0.083% nebulizer solution INHALE 1 VIAL VIA NEBULIZER EVERY 6 HOURS AS NEEDED FOR WHEEZING OR SHORTNESS OF BREATH   budesonide-formoterol (SYMBICORT) 160-4.5 MCG/ACT inhaler Inhale 2 puffs into the lungs 2 (two) times daily.   cloNIDine (CATAPRES) 0.1 MG tablet TAKE 1 TABLET BY MOUTH 2 TIMES A DAY   ELIQUIS 5 MG TABS tablet Take 1 tablet by mouth twice daily (Patient not taking: Reported on 06/29/2020)   furosemide (LASIX) 40 MG tablet TAKE 1 TABLET BY MOUTH DAILY   Insulin Glargine (BASAGLAR KWIKPEN) 100 UNIT/ML Inject 0.24 mLs (24 Units total) into the skin at bedtime. (Patient taking differently: Inject 30 Units into the skin at bedtime. )   ipratropium-albuterol (DUONEB) 0.5-2.5 (3) MG/3ML SOLN Take 3 mLs by nebulization every 6 (six) hours as needed.   Lancets (ONETOUCH DELICA PLUS [id number]) MISC USE TO CHECK BLOOD SUGAR TWICE DAILY AS DIRECTED    linezolid (ZYVOX) 600 MG tablet Take 1 tablet (600 mg total) by mouth 2 (two) times daily.   metFORMIN (GLUCOPHAGE) 500 MG tablet TAKE 1 TABLET BY MOUTH TWICE DAILY WITH A MEAL   metoprolol tartrate (LOPRESSOR) 25 MG tablet Take 1 tablet (25 mg total) by mouth 2 (two) times daily.   ONETOUCH ULTRA test strip CHECK FASTING BLOOD SUGAR TWICE DAILY   OXYGEN Inhale 3 L into the lungs continuous.    potassium chloride SA (KLOR-CON) 20 MEQ tablet Take 1 tablet (20 mEq total) by mouth daily.   pravastatin (PRAVACHOL) 80 MG tablet TAKE 1 TABLET BY MOUTH AT BEDTIME   pregabalin (LYRICA) 100 MG capsule TAKE 1 CAPSULE BY MOUTH TWICE DAILY. STOP GABAPENTIN   tamsulosin (FLOMAX) 0.4 MG CAPS capsule Take 1 capsule (0.4 mg total) by mouth daily.   traMADol (ULTRAM) 50 MG tablet TAKE 1 TABLET BY MOUTH EVERY 8 HOURS AS NEEDED FOR  SHINGLES  PAIN   No facility-administered encounter medications on file as of 07/27/2020.     Objective:  Renewal application for PAP program for [employer] through [employer]. Goals Addressed   None     Completed application for renewal for Basaglar.  This is to be faxed in after 08/02/2020 so that patient does not have a lapse of medication for 2022.  Pending signature from PCP and will be faxed after above date.  All other information already included in application. Plan:   The patient has been provided with contact information for the care management team and has been advised  to call with any health related questions or concerns.    Beverly Milch, PharmD Clinical Pharmacist [place] [phone]  I have collaborated with the care management provider regarding care management and care coordination activities outlined in this encounter and have reviewed this encounter including documentation in the note and care plan. I am certifying that I agree with the content of this note and encounter as supervising physician.

## 2020-07-29 ENCOUNTER — Other Ambulatory Visit: Payer: Self-pay | Admitting: *Deleted

## 2020-07-29 ENCOUNTER — Other Ambulatory Visit: Payer: Self-pay | Admitting: Family Medicine

## 2020-07-29 NOTE — Patient Outreach (Signed)
[facility]) Care Management  07/29/2020  Edward [name] [birth year] [id number]  Telephone Assessment-Diabetes  RN spoke with pt today and received an update. Pt states his blood glucose readings in the AM continue to run around 150-175 with no updated A1C since July at 8.9. Pt continue to be on track as the current plan of care was reviewed and discussed. Will continue to encouraged adherence to the discussed plan of care and continue to offer the needed tools along with discussion of any barriers. Pt state he is really not going to change his dietary habits however RN discussed the risk and strongly encouraged a reduction in the carbohydrates to improve his daily glucose readings.   Review medications as pt states the involved [facility] was suppose to report to Dr. Dennard Schaumann that he is no longer taking the prescribed Eliquis. RN offered to follow up verified all other medications along with a sufficient supply (verified). Pt states he can not afford this medication and agree RN could reach out to his provider as a reminder. RN reached out to Dr. Dennard Schaumann to verified his awareness that the pt is no longer taking this medications. Dr. Dennard Schaumann is aware and indicated they have applied for assistance for this medications for this pt.   Plan review and discussed as pt remains on track however additional task were requested in an attempt to improve pt's glucose readings. Will re-evaluate next month on pt's progress and follow up with another outreach call. No other assistance or issues to address at this time.   Goals Addressed            This Visit's Progress   . [facility]N-Monitor and Manage My Blood Sugar   On track    Follow Up Date 07/29/2020   - check blood sugar at prescribed times - enter blood sugar readings and medication or insulin into daily log    Why is this important?   Checking your blood sugar at home helps to keep it from getting very high or very low.  Writing the  results in a diary or log helps the doctor know how to care for you.  Your blood sugar log should have the time, date and the results.  Also, write down the amount of insulin or other medicine that you take.  Other information, like what you ate, exercise done and how you were feeling, will also be helpful.     Notes: Stress the importance of logging all readings prior to his initial meal (fasting)      . THN-Set My Target A1C   On track    Follow Up Date 07/29/2020   - set target A1C-   Why is this important?   Your target A1C is decided together by you and your doctor.  It is based on several things like your age and other health issues.    Notes: Discussed the importance of lowering the A1C.       Raina Mina, RN Care Management Coordinator [place] Office [phone]

## 2020-07-30 ENCOUNTER — Other Ambulatory Visit: Payer: Self-pay | Admitting: Family Medicine

## 2020-08-02 DIAGNOSIS — J449 Chronic obstructive pulmonary disease, unspecified: Secondary | ICD-10-CM | POA: Diagnosis not present

## 2020-08-03 ENCOUNTER — Telehealth: Payer: Self-pay | Admitting: Family Medicine

## 2020-08-03 ENCOUNTER — Other Ambulatory Visit: Payer: Self-pay | Admitting: Family Medicine

## 2020-08-03 ENCOUNTER — Ambulatory Visit: Payer: Self-pay | Admitting: Pharmacist

## 2020-08-03 MED ORDER — LOSARTAN POTASSIUM 25 MG PO TABS: 25.0000 mg | ORAL_TABLET | Freq: Every day | ORAL | 3 refills | Status: DC

## 2020-08-03 MED ORDER — TRAMADOL HCL 50 MG PO TABS: ORAL_TABLET | ORAL | 0 refills | Status: DC

## 2020-08-03 NOTE — Telephone Encounter (Signed)
Refill Tramadol    [place], [place] - [address]

## 2020-08-03 NOTE — Chronic Care Management (AMB) (Addendum)
Chronic Care Management   Follow Up Note   08/03/2020 Name: Edward [name] MRN: [id number] DOB: 31-Oct-1934  Referred by: Susy Frizzle, MD Reason for referral : No chief complaint on file.   Edward [name] is a 85 y.o. year old male who is a primary care patient of [name], [name], MD. The CCM team was consulted for assistance with chronic disease management and care coordination needs.    Review of patient status, including review of consultants reports, relevant laboratory and other test results, and collaboration with appropriate care team members and the patient's provider was performed as part of comprehensive patient evaluation and provision of chronic care management services.    SDOH (Social Determinants of Health) assessments performed: No See Care Plan activities for detailed interventions related to [facility])      Outpatient Encounter Medications as of 08/03/2020  Medication Sig   acetaminophen (TYLENOL) 500 MG tablet Take 500 mg by mouth every 6 (six) hours as needed for headache.   albuterol (PROVENTIL) (2.5 MG/3ML) 0.083% nebulizer solution INHALE 1 VIAL VIA NEBULIZER EVERY 6 HOURS AS NEEDED FOR WHEEZING OR SHORTNESS OF BREATH   budesonide-formoterol (SYMBICORT) 160-4.5 MCG/ACT inhaler Inhale 2 puffs into the lungs 2 (two) times daily.   cloNIDine (CATAPRES) 0.1 MG tablet TAKE 1 TABLET BY MOUTH 2 TIMES A DAY   ELIQUIS 5 MG TABS tablet Take 1 tablet by mouth twice daily (Patient not taking: Reported on 06/29/2020)   furosemide (LASIX) 40 MG tablet TAKE 1 TABLET BY MOUTH DAILY   Insulin Glargine (BASAGLAR KWIKPEN) 100 UNIT/ML Inject 0.24 mLs (24 Units total) into the skin at bedtime. (Patient taking differently: Inject 30 Units into the skin at bedtime. )   ipratropium-albuterol (DUONEB) 0.5-2.5 (3) MG/3ML SOLN Take 3 mLs by nebulization every 6 (six) hours as needed.   Lancets (ONETOUCH DELICA PLUS [id number]) MISC USE TO CHECK BLOOD SUGAR TWICE DAILY AS DIRECTED   linezolid  (ZYVOX) 600 MG tablet Take 1 tablet (600 mg total) by mouth 2 (two) times daily.   metFORMIN (GLUCOPHAGE) 500 MG tablet TAKE 1 TABLET BY MOUTH TWICE DAILY WITH A MEAL   metoprolol tartrate (LOPRESSOR) 25 MG tablet Take 1 tablet (25 mg total) by mouth 2 (two) times daily.   ONETOUCH ULTRA test strip CHECK FASTING BLOOD SUGAR TWICE DAILY   OXYGEN Inhale 3 L into the lungs continuous.    potassium chloride SA (KLOR-CON) 20 MEQ tablet Take 1 tablet (20 mEq total) by mouth daily.   pravastatin (PRAVACHOL) 80 MG tablet TAKE 1 TABLET BY MOUTH AT BEDTIME   pregabalin (LYRICA) 100 MG capsule TAKE 1 CAPSULE BY MOUTH TWICE DAILY. STOP GABAPENTIN   tamsulosin (FLOMAX) 0.4 MG CAPS capsule Take 1 capsule (0.4 mg total) by mouth daily.   traMADol (ULTRAM) 50 MG tablet TAKE 1 TABLET BY MOUTH EVERY 8 HOURS AS NEEDED FOR  SHINGLES  PAIN   No facility-administered encounter medications on file as of 08/03/2020.     Objective:  Application for LIS through Medicare.  Also inform patient of new BP medication regimen. Goals Addressed   None   Called patient and walked them through application for LIS program over the phone.  Application submitted and instructed them to follow up with me with approval or denial so that we can use a denial letter to apply for Jardiance PAP if needed.  Also informed patients son that we will be calling in Losartan 25mg  to [facility] in [place].  He is to hold  clonidine and monitor his BP daily for a week when he first starts taking this.  Will initiate monitoring and follow up with CPA.   Plan:   The patient has been provided with contact information for the care management team and has been advised to call with any health related questions or concerns.  Beverly Milch, PharmD Clinical Pharmacist [place] [phone]   I have collaborated with the care management provider regarding care management and care coordination activities outlined in this  encounter and have reviewed this encounter including documentation in the note and care plan. I am certifying that I agree with the content of this note and encounter as supervising physician.

## 2020-08-03 NOTE — Telephone Encounter (Signed)
Please Advise

## 2020-08-04 ENCOUNTER — Other Ambulatory Visit: Payer: Self-pay | Admitting: Family Medicine

## 2020-08-12 ENCOUNTER — Other Ambulatory Visit: Payer: Self-pay | Admitting: Family Medicine

## 2020-08-18 ENCOUNTER — Other Ambulatory Visit: Payer: Self-pay | Admitting: Family Medicine

## 2020-08-19 ENCOUNTER — Telehealth: Payer: Self-pay | Admitting: Family Medicine

## 2020-08-19 ENCOUNTER — Other Ambulatory Visit: Payer: Self-pay

## 2020-08-19 MED ORDER — PREGABALIN 100 MG PO CAPS: ORAL_CAPSULE | ORAL | 2 refills | Status: DC

## 2020-08-19 NOTE — Telephone Encounter (Signed)
Pt rx refill sent to Provider

## 2020-08-19 NOTE — Telephone Encounter (Signed)
Need refill on his Gabapentin

## 2020-08-19 NOTE — Telephone Encounter (Signed)
Please Advise

## 2020-08-28 ENCOUNTER — Other Ambulatory Visit: Payer: Self-pay | Admitting: Family Medicine

## 2020-08-30 ENCOUNTER — Other Ambulatory Visit: Payer: Self-pay | Admitting: *Deleted

## 2020-08-30 NOTE — Patient Outreach (Signed)
[facility]) Care Management  08/30/2020  Edward [name] 12-15-1934 [id number]   Telephone Assessment-Unsuccessful  Unsuccessful to the return call requested. Pt was not available when RN case management spoke with his son. RN leave a message requesting a call back however will rescheduled another outreach call within the next week. Will also send outreach letter according to the workflow.  Raina Mina, RN Care Management Coordinator [place] Office [phone]

## 2020-08-30 NOTE — Patient Outreach (Signed)
[facility]) Care Management  08/30/2020  Edward [name] 1935/06/01 [id number]   Telephone Assessment-Unsuccessful (request a call back)  RN spoke with family member who indicated pt was not available however requested a call back later today.  RN will attempt another outreach later today for ongoing [facility] services and update on pt's ongoing management of care.  Raina Mina, RN Care Management Coordinator [place] Office [phone]

## 2020-08-31 ENCOUNTER — Telehealth: Payer: Self-pay | Admitting: Pharmacist

## 2020-08-31 NOTE — Progress Notes (Signed)
Chronic Care Management Pharmacy Assistant   Name: Edward [name]  MRN: [id number] DOB: June 12, 1935  Reason for Encounter: Disease State  Patient Questions:  1.  Have you seen any other providers since your last visit? No  2.  Any changes in your medicines or health? No    PCP : Susy Frizzle, MD   Their chronic conditions include: Type II DM, Hypertension, hyperlipidemia, COPD, CKD stage III.  Allergies:   Allergies  Allergen Reactions  . Ace Inhibitors Other (See Comments)    Hyperkalemia--07/23/2013:patient states not familiar with the following allergy    Medications: Outpatient Encounter Medications as of 08/31/2020  Medication Sig  . acetaminophen (TYLENOL) 500 MG tablet Take 500 mg by mouth every 6 (six) hours as needed for headache.  . albuterol (PROVENTIL) (2.5 MG/3ML) 0.083% nebulizer solution INHALE 1 VIAL VIA NEBULIZER EVERY 6 HOURS AS NEEDED FOR WHEEZING OR SHORTNESS OF BREATH  . budesonide-formoterol (SYMBICORT) 160-4.5 MCG/ACT inhaler Inhale 2 puffs into the lungs 2 (two) times daily.  . cloNIDine (CATAPRES) 0.1 MG tablet TAKE 1 TABLET BY MOUTH 2 TIMES A DAY  . ELIQUIS 5 MG TABS tablet Take 1 tablet by mouth twice daily (Patient not taking: Reported on 06/29/2020)  . furosemide (LASIX) 40 MG tablet TAKE 1 TABLET BY MOUTH DAILY  . Insulin Glargine (BASAGLAR KWIKPEN) 100 UNIT/ML Inject 0.24 mLs (24 Units total) into the skin at bedtime. (Patient taking differently: Inject 30 Units into the skin at bedtime. )  . ipratropium-albuterol (DUONEB) 0.5-2.5 (3) MG/3ML SOLN Take 3 mLs by nebulization every 6 (six) hours as needed.  . Lancets (ONETOUCH DELICA PLUS [id number]) MISC USE TO CHECK BLOOD SUGAR TWICE DAILY AS DIRECTED  . linezolid (ZYVOX) 600 MG tablet Take 1 tablet (600 mg total) by mouth 2 (two) times daily.  Marland Kitchen losartan (COZAAR) 25 MG tablet Take 1 tablet (25 mg total) by mouth daily.  . metFORMIN (GLUCOPHAGE) 500 MG tablet TAKE 1 TABLET BY MOUTH TWICE DAILY  WITH A MEAL  . metoprolol tartrate (LOPRESSOR) 25 MG tablet Take 1 tablet (25 mg total) by mouth 2 (two) times daily.  [name] ULTRA test strip CHECK FASTING BLOOD SUGAR TWICE DAILY  . OXYGEN Inhale 3 L into the lungs continuous.   . potassium chloride SA (KLOR-CON) 20 MEQ tablet Take 1 tablet (20 mEq total) by mouth daily.  . pravastatin (PRAVACHOL) 80 MG tablet TAKE 1 TABLET BY MOUTH AT BEDTIME  . pregabalin (LYRICA) 100 MG capsule TAKE 1 CAPSULE BY MOUTH TWICE DAILY. STOP GABAPENTIN  . tamsulosin (FLOMAX) 0.4 MG CAPS capsule Take 1 capsule (0.4 mg total) by mouth daily.  . traMADol (ULTRAM) 50 MG tablet TAKE 1 TABLET BY MOUTH EVERY 8 HOURS AS NEEDED FOR  SHINGLES  PAIN   No facility-administered encounter medications on file as of 08/31/2020.    Current Diagnosis: Patient Active Problem List   Diagnosis Date Noted  . Sepsis due to methicillin resistant Staphylococcus aureus (MRSA) ([place]) 04/20/2020  . MRSA bacteremia 04/16/2020  . Acute kidney injury ([place]) 04/16/2020  . Acute metabolic encephalopathy 42/70/6237  . Altered mental status 03/23/2020  . Normocytic anemia 03/23/2020  . Mixed hyperlipidemia 03/06/2019  . Parotitis 11/01/2018  . Syncope 10/30/2018  . Facial cellulitis 10/30/2018  . Chronic respiratory failure with hypoxia ([place]) 10/30/2018  . Chronic diastolic heart failure ([place])   . Hypoxia   . Atrial fibrillation ([place])   . COPD with acute exacerbation ([place]) 07/31/2018  . Hyponatremia 07/31/2018  .  COPD (chronic obstructive pulmonary disease) ([place]) 05/21/2018  . Stage 3 chronic kidney disease ([place]) 05/21/2018  . Diastolic congestive heart failure ([place]) 05/21/2018  . Type 2 diabetes mellitus ([place]) 05/21/2018  . HTN (hypertension) 05/21/2018  . Iron deficiency anemia due to chronic blood loss 04/30/2018  . Respiratory distress   . Hematochezia   . Goals of care, counseling/discussion   . Palliative care by specialist   . DNR (do not resuscitate) discussion   .  Acute renal failure with acute tubular necrosis superimposed on stage 3 chronic kidney disease ([place])   . Palliative care encounter   . COPD exacerbation ([place]) 04/17/2018  . Acute on chronic diastolic CHF (congestive heart failure) ([place])   . Severe anemia 02/17/2018  . Bradycardia 02/17/2018  . Hypothermia   . Gastroesophageal reflux disease   . Acute encephalopathy 09/25/2017  . On home O2 09/25/2017  . Bilateral lower extremity edema 06/27/2017  . Insomnia 03/28/2017  . CKD (chronic kidney disease), stage III ([place]) 03/07/2017  . AF (paroxysmal atrial fibrillation) ([place]) 03/05/2017  . Chronic diastolic CHF (congestive heart failure) ([place]) 03/04/2017  . HCAP (healthcare-associated pneumonia) 03/04/2017  . Sepsis due to pneumonia ([place]) 02/25/2017  . Constipation 02/25/2017  . Overflow diarrhea/Constipation 02/25/2017  . DM type 2 causing vascular disease ([place]) 10/11/2016  . Non compliance w medication regimen 12/02/2015  . Hypercholesterolemia 05/12/2014  . Acute on chronic respiratory failure with hypoxemia ([place]) 11/17/2013  . Elevated PSA 01/20/2013  . Hypertension   . Elevated lipids   . Pulmonary fibrosis ([place])   . Colon polyps   . Colon polyps     Goals Addressed   None    Reviewed chart prior to disease state call. Spoke with patient regarding BP  Recent Office Vitals: BP Readings from Last 3 Encounters:  05/14/20 (!) 120/58  05/03/20 (!) 120/50  04/26/20 114/66   Pulse Readings from Last 3 Encounters:  05/14/20 67  05/03/20 61  04/26/20 74    Wt Readings from Last 3 Encounters:  05/14/20 161 lb (73 kg)  05/03/20 161 lb (73 kg)  04/26/20 161 lb (73 kg)     Kidney Function Lab Results  Component Value Date/Time   CREATININE 1.15 (H) 05/14/2020 12:03 PM   CREATININE 1.33 (H) 05/03/2020 11:50 AM   GFRNONAA 59 (L) 05/14/2020 12:03 PM   GFRAA 68 05/14/2020 12:03 PM    BMP Latest Ref Rng & Units 05/14/2020 05/03/2020 04/26/2020  Glucose 65 - 99 mg/dL 245(H)  228(H) 103(H)  BUN 7 - 25 mg/dL 34(H) 35(H) 29(H)  Creatinine 0.70 - 1.11 mg/dL 1.15(H) 1.33(H) 1.42(H)  BUN/Creat Ratio 6 - 22 (calc) 30(H) 26(H) 20  Sodium 135 - 146 mmol/L 138 133(L) 135  Potassium 3.5 - 5.3 mmol/L 5.3 4.6 4.7  Chloride 98 - 110 mmol/L 95(L) 94(L) 96(L)  CO2 20 - 32 mmol/L 35(H) 29 27  Calcium 8.6 - 10.3 mg/dL 9.4 9.1 9.0      Recent Relevant Labs: Lab Results  Component Value Date/Time   HGBA1C 8.9 (H) 04/14/2020 11:47 AM   HGBA1C 9.1 (H) 03/23/2020 05:05 PM   HGBA1C >14.0 (H) 12/01/2015 08:31 AM   HGBA1C >14.0 (H) 05/27/2015 08:54 AM   MICROALBUR 378.4 01/23/2020 03:09 PM   MICROALBUR 144.9 02/10/2019 09:31 AM    Kidney Function Lab Results  Component Value Date/Time   CREATININE 1.15 (H) 05/14/2020 12:03 PM   CREATININE 1.33 (H) 05/03/2020 11:50 AM   GFRNONAA 59 (L) 05/14/2020 12:03 PM  GFRAA 68 05/14/2020 12:03 PM   . Current antihypertensive regimen:   clonidine 0.1mg  tabs twice daily   furosemide 40 mg  metoprolol 25 mg twice daily   . Current antihyperglycemic regimen:   Basaglar 30 units in the morning  Metformin 500 mg twice daily   Adherence Review: Is the patient currently on a STATIN medication? Yes pravastatin 80 MG tablet Is the patient currently on ACE/ARB medication? Yes losartan 25 MG tablet Does the patient have >5 day gap between last estimated fill dates? No CPP please confirm  Attempted to complete Disease Sate call for patient. Spoke with patient's son, Edward [name], to inquire is they received the PAP renewal paperwork. He stated that he believes there was something recived, but they have not completed it. Advised that he get the paperwork completed and returned as soon as possible in order to ensure approval of medication Basaglar for the following year. The patient was not home to review hypertension and diabetes items. His son asked that I call back tomorrow after 9 am.  Follow-Up:  Pharmacist Review   Fanny Skates, [place]  Pharmacist Assistant [phone]

## 2020-09-01 ENCOUNTER — Other Ambulatory Visit: Payer: Self-pay

## 2020-09-01 DIAGNOSIS — J449 Chronic obstructive pulmonary disease, unspecified: Secondary | ICD-10-CM | POA: Diagnosis not present

## 2020-09-01 MED ORDER — POTASSIUM CHLORIDE CRYS ER 20 MEQ PO TBCR: 20.0000 meq | EXTENDED_RELEASE_TABLET | Freq: Every day | ORAL | 1 refills | Status: DC

## 2020-09-03 ENCOUNTER — Encounter: Payer: Self-pay | Admitting: *Deleted

## 2020-09-03 ENCOUNTER — Other Ambulatory Visit: Payer: Self-pay | Admitting: *Deleted

## 2020-09-03 NOTE — Patient Outreach (Signed)
[facility]) Care Management  09/03/2020  Edward [name] 03-24-1935 [id number]   Telephone Assessment-Unsuccessful  RN 3rd outreach attempt unsuccessful. RN unable to leave a HIPAA approved voice message however sent outreach letter earlier this week.  Will scheduled another outreach call next month for ongoing [name] services.  Raina Mina, RN Care Management Coordinator [place] Office [phone]

## 2020-09-07 ENCOUNTER — Ambulatory Visit: Payer: Self-pay | Admitting: Pharmacist

## 2020-09-07 NOTE — Chronic Care Management (AMB) (Addendum)
Chronic Care Management   Follow Up Note   09/07/2020 Name: Edward [name] MRN: [id number] DOB: [birth year]  Referred by: Edward Frizzle, MD Reason for referral : No chief complaint on file.   Edward [name] is a 85 y.o. year old male who is a primary care patient of [name], [name], MD. The CCM team was consulted for assistance with chronic disease management and care coordination needs.    Review of patient status, including review of consultants reports, relevant laboratory and other test results, and collaboration with appropriate care team members and the patient's provider was performed as part of comprehensive patient evaluation and provision of chronic care management services.    SDOH (Social Determinants of Health) assessments performed: No See Care Plan activities for detailed interventions related to Edward [name])      Outpatient Encounter Medications as of 09/07/2020  Medication Sig   acetaminophen (TYLENOL) 500 MG tablet Take 500 mg by mouth every 6 (six) hours as needed for headache.   albuterol (PROVENTIL) (2.5 MG/3ML) 0.083% nebulizer solution INHALE 1 VIAL VIA NEBULIZER EVERY 6 HOURS AS NEEDED FOR WHEEZING OR SHORTNESS OF BREATH   budesonide-formoterol (SYMBICORT) 160-4.5 MCG/ACT inhaler Inhale 2 puffs into the lungs 2 (two) times daily.   cloNIDine (CATAPRES) 0.1 MG tablet TAKE 1 TABLET BY MOUTH 2 TIMES A DAY   ELIQUIS 5 MG TABS tablet Take 1 tablet by mouth twice daily (Patient not taking: Reported on 06/29/2020)   furosemide (LASIX) 40 MG tablet TAKE 1 TABLET BY MOUTH DAILY   Insulin Glargine (BASAGLAR KWIKPEN) 100 UNIT/ML Inject 0.24 mLs (24 Units total) into the skin at bedtime. (Patient taking differently: Inject 30 Units into the skin at bedtime. )   ipratropium-albuterol (DUONEB) 0.5-2.5 (3) MG/3ML SOLN Take 3 mLs by nebulization every 6 (six) hours as needed.   Lancets (ONETOUCH DELICA PLUS [id number]) MISC USE TO CHECK BLOOD SUGAR TWICE DAILY AS DIRECTED   linezolid  (ZYVOX) 600 MG tablet Take 1 tablet (600 mg total) by mouth 2 (two) times daily.   losartan (COZAAR) 25 MG tablet Take 1 tablet (25 mg total) by mouth daily.   metFORMIN (GLUCOPHAGE) 500 MG tablet TAKE 1 TABLET BY MOUTH TWICE DAILY WITH A MEAL   metoprolol tartrate (LOPRESSOR) 25 MG tablet Take 1 tablet (25 mg total) by mouth 2 (two) times daily.   ONETOUCH ULTRA test strip CHECK FASTING BLOOD SUGAR TWICE DAILY   OXYGEN Inhale 3 L into the lungs continuous.    potassium chloride SA (KLOR-CON) 20 MEQ tablet Take 1 tablet (20 mEq total) by mouth daily.   pravastatin (PRAVACHOL) 80 MG tablet TAKE 1 TABLET BY MOUTH AT BEDTIME   pregabalin (LYRICA) 100 MG capsule TAKE 1 CAPSULE BY MOUTH TWICE DAILY. STOP GABAPENTIN   tamsulosin (FLOMAX) 0.4 MG CAPS capsule Take 1 capsule (0.4 mg total) by mouth daily.   traMADol (ULTRAM) 50 MG tablet TAKE 1 TABLET BY MOUTH EVERY 8 HOURS AS NEEDED FOR  SHINGLES  PAIN   No facility-administered encounter medications on file as of 09/07/2020.     Objective:  Contacted Edward [name] regarding LIS application on 38/25/0539. Goals Addressed   None    Patient's [name] confirmed they had received the letter, however he was unsure where it is to tell me whether or not they had been approved.    Called [name] back on 12/9 and they still had not found the letter, have asked them to provide me with medicare card so that I can  follow up with them or SS on the application.  Called medicare to follow up who then directed me to call social security to follow up on this application.  Social security would not provide me with the status of this application.  Informed [name] [name], Edward [name] that they will either need to find the letter or contact Social Security office to determine the status of this application so we can get Edward [name] back on the medications that he needs.   Plan:   The patient has been provided with contact information for the care management team and has been  advised to call with any health related questions or concerns.    Beverly Milch, PharmD Clinical Pharmacist [place] [phone]  I have collaborated with the care management provider regarding care management and care coordination activities outlined in this encounter and have reviewed this encounter including documentation in the note and care plan. I am certifying that I agree with the content of this note and encounter as supervising physician.

## 2020-09-15 ENCOUNTER — Other Ambulatory Visit: Payer: Self-pay | Admitting: Family Medicine

## 2020-09-28 ENCOUNTER — Other Ambulatory Visit: Payer: Self-pay | Admitting: Family Medicine

## 2020-10-02 DIAGNOSIS — J449 Chronic obstructive pulmonary disease, unspecified: Secondary | ICD-10-CM | POA: Diagnosis not present

## 2020-10-04 ENCOUNTER — Other Ambulatory Visit: Payer: Self-pay | Admitting: *Deleted

## 2020-10-04 NOTE — Patient Outreach (Signed)
[facility]) Care Management  10/04/2020  Edward [name] August 31, 1935 [id number]   Telephone Assessment-Unsuccessful-Case closure  RN attempted the 4th outreach call to pt however unsuccessful. RN unable to contact pt at the numbers noted via Epic and unable to leave a HIPAA approved voice message.  Will notify the provider and close this case due to inability to contact.  [name], RN Care Management Coordinator [facility] Main Office [phone]

## 2020-10-12 NOTE — Chronic Care Management (AMB) (Deleted)
Chronic Care Management   Follow Up Note   10/21/2020 Name: Edward [name] MRN: [id number] DOB: [birth year]  Referred by: Susy Frizzle, MD Reason for referral : No chief complaint on file.   Edward [name] is a 86 y.o. year old male who is a primary care patient of [name], [name], MD. The CCM team was consulted for assistance with chronic disease management and care coordination needs.    Review of patient status, including review of consultants reports, relevant laboratory and other test results, and collaboration with appropriate care team members and the patient's provider was performed as part of comprehensive patient evaluation and provision of chronic care management services.    SDOH (Social Determinants of Health) assessments performed: No See Care Plan activities for detailed interventions related to [facility])     Outpatient Encounter Medications as of 10/21/2020  Medication Sig  . acetaminophen (TYLENOL) 500 MG tablet Take 500 mg by mouth every 6 (six) hours as needed for headache.  . albuterol (PROVENTIL) (2.5 MG/3ML) 0.083% nebulizer solution INHALE 1 VIAL VIA NEBULIZER EVERY 6 HOURS AS NEEDED FOR WHEEZING OR SHORTNESS OF BREATH  . budesonide-formoterol (SYMBICORT) 160-4.5 MCG/ACT inhaler Inhale 2 puffs into the lungs 2 (two) times daily.  . cloNIDine (CATAPRES) 0.1 MG tablet TAKE 1 TABLET BY MOUTH 2 TIMES A DAY  . ELIQUIS 5 MG TABS tablet Take 1 tablet by mouth twice daily (Patient not taking: Reported on 06/29/2020)  . furosemide (LASIX) 40 MG tablet TAKE 1 TABLET BY MOUTH DAILY  . gabapentin (NEURONTIN) 100 MG capsule TAKE 1 CAPSULE BY MOUTH THREE TIMES DAILY  . Insulin Glargine (BASAGLAR KWIKPEN) 100 UNIT/ML Inject 0.24 mLs (24 Units total) into the skin at bedtime. (Patient taking differently: Inject 30 Units into the skin at bedtime. )  . ipratropium-albuterol (DUONEB) 0.5-2.5 (3) MG/3ML SOLN Take 3 mLs by nebulization every 6 (six) hours as needed.  . Lancets  (ONETOUCH DELICA PLUS [id number]) MISC USE TO CHECK BLOOD SUGAR TWICE DAILY AS DIRECTED  . linezolid (ZYVOX) 600 MG tablet Take 1 tablet (600 mg total) by mouth 2 (two) times daily.  Marland Kitchen losartan (COZAAR) 25 MG tablet Take 1 tablet (25 mg total) by mouth daily.  . metFORMIN (GLUCOPHAGE) 500 MG tablet TAKE 1 TABLET BY MOUTH TWICE DAILY WITH A MEAL  . metoprolol tartrate (LOPRESSOR) 25 MG tablet Take 1 tablet (25 mg total) by mouth 2 (two) times daily.  [name] ULTRA test strip CHECK FASTING BLOOD SUGAR TWICE DAILY  . OXYGEN Inhale 3 L into the lungs continuous.   . potassium chloride SA (KLOR-CON) 20 MEQ tablet Take 1 tablet (20 mEq total) by mouth daily.  . pravastatin (PRAVACHOL) 80 MG tablet TAKE 1 TABLET BY MOUTH AT BEDTIME  . pregabalin (LYRICA) 100 MG capsule TAKE 1 CAPSULE BY MOUTH TWICE DAILY. STOP GABAPENTIN  . tamsulosin (FLOMAX) 0.4 MG CAPS capsule Take 1 capsule (0.4 mg total) by mouth daily.  . traMADol (ULTRAM) 50 MG tablet TAKE 1 TABLET BY MOUTH EVERY 8 HOURS AS NEEDED FOR  SHINGLES  PAIN   No facility-administered encounter medications on file as of 10/21/2020.     Objective:   Goals Addressed   None    Diabetes   A1c goal <7%  Recent Relevant Labs: Lab Results  Component Value Date/Time   HGBA1C 8.9 (H) 04/14/2020 11:47 AM   HGBA1C 9.1 (H) 03/23/2020 05:05 PM   HGBA1C >14.0 (H) 12/01/2015 08:31 AM   HGBA1C >14.0 (H) 05/27/2015 08:54  AM   MICROALBUR 378.4 01/23/2020 03:09 PM   MICROALBUR 144.9 02/10/2019 09:31 AM    Last diabetic Eye exam:  Lab Results  Component Value Date/Time   [place] 05/14/2012 12:00 AM    Last diabetic Foot exam: No results found for: HMDIABFOOTEX   Checking BG: 2x per Day  Recent FBG Readings: 150-200  Patient has failed these meds in past: none noted Patient is currently uncontrolled on the following medications: . Basaglar 100units/ml 30 units daily . Metformin 500mg  twice daily with a meal  We  discussed:   He was getting Jardiance from PAP but for some reason he cannot get this anymore.  Will follow up with program to see why this was d/c.  Fasting sugars still elevated, however A1c trending in right direction.  He denies hypoglycemia.  Requests refill on Metformin.  Needs to be on Jardiance due to other comorbidities.  Plan  Continue current medications, follow up on Jardiance PAP through manufacturer.  Hypertension   BP goal is:  <130/80  Office blood pressures are  BP Readings from Last 3 Encounters:  05/14/20 (!) 120/58  05/03/20 (!) 120/50  04/26/20 114/66   Patient checks BP at home infrequently Patient home BP readings are ranging: no logs available  Patient has failed these meds in the past: ACEIs Patient is currently controlled on the following medications:  . Clonidine 0.1mg  twice daily . Metoprolol tartrate 25mg  twice daily  We discussed   Patient's son states sometimes his BP is elevated  Last few office BP have been on the low side.  Patient showed microalbuminuria at [facility] in April labs. -- he is not currently on ACE or ARB  Have already consulted with Dr. Dennard Schaumann on adding losartan 25mg  daily, patient instructed he will need to monitor BP closely.  If it trends low would recommend to d/c clonidine.  Plan  Continue current medications, add losartan 25mg  daily will call Rx to [facility].    Beverly Milch, PharmD Clinical Pharmacist [place] [phone]

## 2020-10-21 ENCOUNTER — Telehealth: Payer: Self-pay

## 2020-10-22 ENCOUNTER — Telehealth: Payer: Self-pay | Admitting: Pharmacist

## 2020-10-22 NOTE — Progress Notes (Signed)
° ° °  Chronic Care Management Pharmacy Assistant   Name: Edward [name]  MRN: [id number] DOB: June 12, 1935  Reason for Encounter: Adherence Review  Patient Questions:  1.  Have you seen any other providers since your last visit? No.   2.  Any changes in your medicines or health? No.   PCP : Susy Frizzle, MD  Verified Adherence Gap Information. Per insurance data, the patient is 90-99% compliant with the following medication: Pravastatin 80 mg. Patients total gap measures equal to 2. The patient has not met their annual wellness and wellness bundle screening. The patient has me their goal with keeping their blood pressure less than 140/90.Their most recent A1C was 8.9 on 04/14/20.  Follow-Up:  Pharmacist Review

## 2020-11-02 DIAGNOSIS — J449 Chronic obstructive pulmonary disease, unspecified: Secondary | ICD-10-CM | POA: Diagnosis not present

## 2020-11-04 ENCOUNTER — Other Ambulatory Visit: Payer: Self-pay | Admitting: Family Medicine

## 2020-11-04 DIAGNOSIS — R600 Localized edema: Secondary | ICD-10-CM

## 2020-11-04 DIAGNOSIS — I5032 Chronic diastolic (congestive) heart failure: Secondary | ICD-10-CM

## 2020-11-04 DIAGNOSIS — Z09 Encounter for follow-up examination after completed treatment for conditions other than malignant neoplasm: Secondary | ICD-10-CM

## 2020-11-24 ENCOUNTER — Other Ambulatory Visit: Payer: Self-pay | Admitting: Family Medicine

## 2020-11-30 DIAGNOSIS — J449 Chronic obstructive pulmonary disease, unspecified: Secondary | ICD-10-CM | POA: Diagnosis not present

## 2020-12-08 ENCOUNTER — Other Ambulatory Visit: Payer: Self-pay | Admitting: Family Medicine

## 2020-12-19 ENCOUNTER — Other Ambulatory Visit: Payer: Self-pay

## 2020-12-19 ENCOUNTER — Emergency Department (HOSPITAL_COMMUNITY)
Admission: EM | Admit: 2020-12-19 | Discharge: 2020-12-19 | Disposition: A | Discharge status: Home / Self Care | Payer: Medicare Other | Attending: Emergency Medicine | Admitting: Emergency Medicine

## 2020-12-19 ENCOUNTER — Emergency Department (HOSPITAL_COMMUNITY): Payer: Medicare Other

## 2020-12-19 ENCOUNTER — Encounter (HOSPITAL_COMMUNITY): Payer: Self-pay

## 2020-12-19 DIAGNOSIS — Z87891 Personal history of nicotine dependence: Secondary | ICD-10-CM | POA: Diagnosis not present

## 2020-12-19 DIAGNOSIS — I480 Paroxysmal atrial fibrillation: Secondary | ICD-10-CM | POA: Diagnosis not present

## 2020-12-19 DIAGNOSIS — I5032 Chronic diastolic (congestive) heart failure: Secondary | ICD-10-CM | POA: Insufficient documentation

## 2020-12-19 DIAGNOSIS — R079 Chest pain, unspecified: Secondary | ICD-10-CM | POA: Diagnosis not present

## 2020-12-19 DIAGNOSIS — J441 Chronic obstructive pulmonary disease with (acute) exacerbation: Secondary | ICD-10-CM | POA: Diagnosis not present

## 2020-12-19 DIAGNOSIS — Z7984 Long term (current) use of oral hypoglycemic drugs: Secondary | ICD-10-CM | POA: Diagnosis not present

## 2020-12-19 DIAGNOSIS — I130 Hypertensive heart and chronic kidney disease with heart failure and stage 1 through stage 4 chronic kidney disease, or unspecified chronic kidney disease: Secondary | ICD-10-CM | POA: Insufficient documentation

## 2020-12-19 DIAGNOSIS — N183 Chronic kidney disease, stage 3 unspecified: Secondary | ICD-10-CM | POA: Diagnosis not present

## 2020-12-19 DIAGNOSIS — Z20822 Contact with and (suspected) exposure to covid-19: Secondary | ICD-10-CM | POA: Insufficient documentation

## 2020-12-19 DIAGNOSIS — E1122 Type 2 diabetes mellitus with diabetic chronic kidney disease: Secondary | ICD-10-CM | POA: Diagnosis not present

## 2020-12-19 DIAGNOSIS — Z79899 Other long term (current) drug therapy: Secondary | ICD-10-CM | POA: Diagnosis not present

## 2020-12-19 DIAGNOSIS — Z794 Long term (current) use of insulin: Secondary | ICD-10-CM | POA: Diagnosis not present

## 2020-12-19 DIAGNOSIS — R0602 Shortness of breath: Secondary | ICD-10-CM | POA: Diagnosis not present

## 2020-12-19 LAB — CBC WITH DIFFERENTIAL/PLATELET
Abs Immature Granulocytes: 0.03 10*3/uL (ref 0.00–0.07)
Basophils Absolute: 0 10*3/uL (ref 0.0–0.1)
Basophils Relative: 1 %
Eosinophils Absolute: 0.4 10*3/uL (ref 0.0–0.5)
Eosinophils Relative: 6 %
HCT: 29.2 % — ABNORMAL LOW (ref 39.0–52.0)
Hemoglobin: 9.7 g/dL — ABNORMAL LOW (ref 13.0–17.0)
Immature Granulocytes: 1 %
Lymphocytes Relative: 25 %
Lymphs Abs: 1.6 10*3/uL (ref 0.7–4.0)
MCH: 29.7 pg (ref 26.0–34.0)
MCHC: 33.2 g/dL (ref 30.0–36.0)
MCV: 89.3 fL (ref 80.0–100.0)
Monocytes Absolute: 0.5 10*3/uL (ref 0.1–1.0)
Monocytes Relative: 7 %
Neutro Abs: 3.8 10*3/uL (ref 1.7–7.7)
Neutrophils Relative %: 60 %
Platelets: 245 10*3/uL (ref 150–400)
RBC: 3.27 MIL/uL — ABNORMAL LOW (ref 4.22–5.81)
RDW: 12 % (ref 11.5–15.5)
WBC: 6.2 10*3/uL (ref 4.0–10.5)
nRBC: 0 % (ref 0.0–0.2)

## 2020-12-19 LAB — BLOOD GAS, VENOUS
Acid-Base Excess: 2.4 mmol/L — ABNORMAL HIGH (ref 0.0–2.0)
Bicarbonate: 25.9 mmol/L (ref 20.0–28.0)
FIO2: 36
O2 Saturation: 93.3 %
Patient temperature: 37
pCO2, Ven: 57.4 mmHg (ref 44.0–60.0)
pH, Ven: 7.31 (ref 7.250–7.430)
pO2, Ven: 68.8 mmHg — ABNORMAL HIGH (ref 32.0–45.0)

## 2020-12-19 LAB — BASIC METABOLIC PANEL
Anion gap: 12 (ref 5–15)
BUN: 74 mg/dL — ABNORMAL HIGH (ref 8–23)
CO2: 27 mmol/L (ref 22–32)
Calcium: 8.9 mg/dL (ref 8.9–10.3)
Chloride: 91 mmol/L — ABNORMAL LOW (ref 98–111)
Creatinine, Ser: 1.51 mg/dL — ABNORMAL HIGH (ref 0.61–1.24)
GFR, Estimated: 46 mL/min — ABNORMAL LOW (ref >60)
Glucose, Bld: 483 mg/dL — ABNORMAL HIGH (ref 70–99)
Potassium: 4.3 mmol/L (ref 3.5–5.1)
Sodium: 130 mmol/L — ABNORMAL LOW (ref 135–145)

## 2020-12-19 LAB — MAGNESIUM: Magnesium: 1.9 mg/dL (ref 1.7–2.4)

## 2020-12-19 LAB — RESP PANEL BY RT-PCR (FLU A&B, COVID) ARPGX2
Influenza A by PCR: NEGATIVE
Influenza B by PCR: NEGATIVE
SARS Coronavirus 2 by RT PCR: NEGATIVE

## 2020-12-19 MED ORDER — IPRATROPIUM BROMIDE HFA 17 MCG/ACT IN AERS
2.0000 | INHALATION_SPRAY | Freq: Once | RESPIRATORY_TRACT | Status: DC
  Filled 2020-12-19: qty 12.9

## 2020-12-19 MED ORDER — INSULIN ASPART 100 UNIT/ML ~~LOC~~ SOLN
5.0000 [IU] | Freq: Once | SUBCUTANEOUS | Status: AC
  Administered 2020-12-19: 5 [IU] via SUBCUTANEOUS
  Filled 2020-12-19: qty 1

## 2020-12-19 MED ORDER — PREDNISONE 10 MG PO TABS: ORAL_TABLET | ORAL | 0 refills | Status: AC

## 2020-12-19 MED ORDER — AEROCHAMBER PLUS FLO-VU LARGE MISC
1.0000 | Freq: Once | Status: DC
  Filled 2020-12-19 (×2): qty 1

## 2020-12-19 MED ORDER — METHYLPREDNISOLONE SODIUM SUCC 125 MG IJ SOLR
81.0000 mg | Freq: Once | INTRAMUSCULAR | Status: AC
  Administered 2020-12-19: 81 mg via INTRAVENOUS
  Filled 2020-12-19: qty 2

## 2020-12-19 MED ORDER — IPRATROPIUM-ALBUTEROL 0.5-2.5 (3) MG/3ML IN SOLN
3.0000 mL | Freq: Once | RESPIRATORY_TRACT | Status: AC
  Administered 2020-12-19: 3 mL via RESPIRATORY_TRACT
  Filled 2020-12-19: qty 3

## 2020-12-19 MED ORDER — AZITHROMYCIN 250 MG PO TABS: 250.0000 mg | ORAL_TABLET | Freq: Every day | ORAL | 0 refills | Status: DC

## 2020-12-19 MED ORDER — ALBUTEROL SULFATE HFA 108 (90 BASE) MCG/ACT IN AERS
4.0000 | INHALATION_SPRAY | Freq: Once | RESPIRATORY_TRACT | Status: AC
  Administered 2020-12-19: 4 via RESPIRATORY_TRACT

## 2020-12-19 MED ORDER — IPRATROPIUM BROMIDE HFA 17 MCG/ACT IN AERS
2.0000 | INHALATION_SPRAY | Freq: Once | RESPIRATORY_TRACT | Status: AC
  Administered 2020-12-19: 2 via RESPIRATORY_TRACT
  Filled 2020-12-19: qty 12.9

## 2020-12-19 MED ORDER — ALBUTEROL SULFATE HFA 108 (90 BASE) MCG/ACT IN AERS
6.0000 | INHALATION_SPRAY | Freq: Once | RESPIRATORY_TRACT | Status: AC
  Administered 2020-12-19: 6 via RESPIRATORY_TRACT
  Filled 2020-12-19: qty 6.7

## 2020-12-19 NOTE — ED Notes (Signed)
Pt ambulated in room on 3L O2 via nasal canula. Patient sats remained above 98% during ambulation on 3 liters. Pt used walker to ambulate, uses cane at home.

## 2020-12-19 NOTE — ED Notes (Signed)
Respiratory at bedside.

## 2020-12-19 NOTE — ED Provider Notes (Signed)
[facility] EMERGENCY DEPARTMENT Provider Note   CSN: [id number] Arrival date & time: 12/19/20  1328     History Chief Complaint  Patient presents with  . Shortness of Breath    Edward [name] is a 86 y.o. male.  HPI      Edward [name] is a 86 y.o. male, with a history of A. fib, COPD, hypercholesterolemia, HTN, iron deficiency anemia, pulmonary fibrosis, presenting to the ED with shortness of breath over the last couple weeks, much worsened over the last 24 hours. He has been using home nebulizers without definitive improvement.  Intermittent cough with intermittent sputum production. No Covid vaccination.  Denies any recent steroids or antibiotics.  Patient wears 3 L supplemental O2 at baseline. Denies fever/chills, chest pain, abdominal pain, lower extremity edema/pain, N/V/D, or any other complaints.  Past Medical History:  Diagnosis Date  . Allergy    Rhinitis  . Atrial fibrillation ([place])   . Bronchitis   . Chronic respiratory failure ([place])   . Colon polyps   . COPD (chronic obstructive pulmonary disease) ([place])   . Diabetes mellitus   . Elevated lipids   . Hypercholesterolemia   . Hypertension   . Iron deficiency anemia due to chronic blood loss 04/30/2018  . Noncompliance   . On home O2    2L N/C   . PSA elevation   . Pulmonary fibrosis ([place])   . Vitamin D deficiency     Patient Active Problem List   Diagnosis Date Noted  . Sepsis due to methicillin resistant Staphylococcus aureus (MRSA) ([place]) 04/20/2020  . MRSA bacteremia 04/16/2020  . Acute kidney injury ([place]) 04/16/2020  . Acute metabolic encephalopathy 74/05/1447  . Altered mental status 03/23/2020  . Normocytic anemia 03/23/2020  . Mixed hyperlipidemia 03/06/2019  . Parotitis 11/01/2018  . Syncope 10/30/2018  . Facial cellulitis 10/30/2018  . Chronic respiratory failure with hypoxia ([place]) 10/30/2018  . Chronic diastolic heart failure ([place])   . Hypoxia   . Atrial fibrillation ([place])   . COPD  with acute exacerbation ([place]) 07/31/2018  . Hyponatremia 07/31/2018  . COPD (chronic obstructive pulmonary disease) ([place]) 05/21/2018  . Stage 3 chronic kidney disease ([place]) 05/21/2018  . Diastolic congestive heart failure ([place]) 05/21/2018  . Type 2 diabetes mellitus ([place]) 05/21/2018  . HTN (hypertension) 05/21/2018  . Iron deficiency anemia due to chronic blood loss 04/30/2018  . Respiratory distress   . Hematochezia   . Goals of care, counseling/discussion   . Palliative care by specialist   . DNR (do not resuscitate) discussion   . Acute renal failure with acute tubular necrosis superimposed on stage 3 chronic kidney disease ([place])   . Palliative care encounter   . COPD exacerbation ([place]) 04/17/2018  . Acute on chronic diastolic CHF (congestive heart failure) ([place])   . Severe anemia 02/17/2018  . Bradycardia 02/17/2018  . Hypothermia   . Gastroesophageal reflux disease   . Acute encephalopathy 09/25/2017  . On home O2 09/25/2017  . Bilateral lower extremity edema 06/27/2017  . Insomnia 03/28/2017  . CKD (chronic kidney disease), stage III ([place]) 03/07/2017  . AF (paroxysmal atrial fibrillation) ([place]) 03/05/2017  . Chronic diastolic CHF (congestive heart failure) ([place]) 03/04/2017  . HCAP (healthcare-associated pneumonia) 03/04/2017  . Sepsis due to pneumonia ([place]) 02/25/2017  . Constipation 02/25/2017  . Overflow diarrhea/Constipation 02/25/2017  . DM type 2 causing vascular disease ([place]) 10/11/2016  . Non compliance w medication regimen 12/02/2015  . Hypercholesterolemia 05/12/2014  . Acute on  chronic respiratory failure with hypoxemia ([place]) 11/17/2013  . Elevated PSA 01/20/2013  . Hypertension   . Elevated lipids   . Pulmonary fibrosis ([place])   . Colon polyps   . Colon polyps     Past Surgical History:  Procedure Laterality Date  . BIOPSY  04/23/2018   Procedure: BIOPSY;  Surgeon: Danie Binder, MD;  Location: [facility] ENDO SUITE;  Service: Endoscopy;;  duodenum gastric  .  CATARACT EXTRACTION W/PHACO  06/25/2012   Procedure: CATARACT EXTRACTION PHACO AND INTRAOCULAR LENS PLACEMENT (IOC);  Surgeon: Elta Guadeloupe T. Gershon Crane, MD;  Location: [facility] ORS;  Service: Ophthalmology;  Laterality: Left;  CDE=19.01  . CATARACT EXTRACTION W/PHACO  07/09/2012   Procedure: CATARACT EXTRACTION PHACO AND INTRAOCULAR LENS PLACEMENT (IOC);  Surgeon: Elta Guadeloupe T. Gershon Crane, MD;  Location: [facility] ORS;  Service: Ophthalmology;  Laterality: Right;  CDE: 20.09  . COLONOSCOPY WITH PROPOFOL N/A 04/23/2018   Procedure: COLONOSCOPY WITH PROPOFOL;  Surgeon: Danie Binder, MD;  Location: [facility] ENDO SU[facility];  Service: Endoscopy;  Laterality: N/A;  . ESOPHAGOGASTRODUODENOSCOPY (EGD) WITH PROPOFOL N/A 04/23/2018   Procedure: ESOPHAGOGASTRODUODENOSCOPY (EGD) WITH PROPOFOL;  Surgeon: Danie Binder, MD;  Location: [facility] END[facility];  Service: Endoscopy;  Laterality: N/A;       Family History  Problem Relation Age of Onset  . Heart disease Mother   . CAD Other   . Diabetes Other     Social History   Tobacco Use  . Smoking status: Former Smoker    Packs/day: 1.50    Years: 60.00    Pack years: 90.00    Types: Cigarettes    Quit date: 12/31/2012    Years since quitting: 7.9  . Smokeless tobacco: Never Used  Vaping Use  . Vaping Use: Never used  Substance Use Topics  . Alcohol use: No  . Drug use: No    Home Medications Prior to Admission medications   Medication Sig Start Date End Date Taking? Authorizing Provider  predniSONE (DELTASONE) 10 MG tablet Take 4 tablets (40 mg total) by mouth daily for 3 days, THEN 3 tablets (30 mg total) daily for 1 day, THEN 2 tablets (20 mg total) daily for 1 day, THEN 1 tablet (10 mg total) daily for 1 day. 12/19/20 12/25/20 Yes Kyron Schlitt C, PA-C  acetaminophen (TYLENOL) 500 MG tablet Take 500 mg by mouth every 6 (six) hours as needed for headache.    [provider]  albuterol (PROVENTIL) (2.5 MG/3ML) 0.083% nebulizer solution INHALE 1 VIAL VIA NEBULIZER EVERY 6 HOURS AS  NEEDED FOR WHEEZING OR SHORTNESS OF BREATH 08/12/19   Susy Frizzle, MD  budesonide-formoterol [facility]) 160-4.5 MCG/ACT inhaler Inhale 2 puffs into the lungs 2 (two) times daily. 01/13/20   Susy Frizzle, MD  cloNIDine (CATAPRES) 0.1 MG tablet TAKE 1 TABLET BY MOUTH 2 TIMES A DAY 08/30/20   Susy Frizzle, MD  ELIQUIS 5 MG TABS tablet Take 1 tablet by mouth twice daily Patient not taking: Reported on 06/29/2020 03/05/20   Susy Frizzle, MD  furosemide (LASIX) 40 MG tablet TAKE 1 TABLET BY MOUTH DAILY 11/04/20   Susy Frizzle, MD  gabapentin (NEURONTIN) 100 MG capsule TAKE 1 CAPSULE BY MOUTH THREE TIMES DAILY 09/15/20   Susy Frizzle, MD  Insulin Glargine (BASAGLAR KWIKPEN) 100 UNIT/ML Inject 0.24 mLs (24 Units total) into the skin at bedtime. Patient taking differently: Inject 30 Units into the skin at bedtime.  04/19/20   Roxan Hockey, MD  ipratropium-albuterol (DUONEB) 0.5-2.5 (  3) MG/3ML SOLN Take 3 mLs by nebulization every 6 (six) hours as needed. 12/30/19   Susy Frizzle, MD  Lancets (ONETOUCH DELICA PLUS [id number]) MISC USE TO CHECK BLOOD SUGAR TWICE DAILY AS DIRECTED 12/08/20   Susy Frizzle, MD  linezolid (ZYVOX) 600 MG tablet Take 1 tablet (600 mg total) by mouth 2 (two) times daily. 04/19/20   Roxan Hockey, MD  losartan (COZAAR) 25 MG tablet Take 1 tablet (25 mg total) by mouth daily. 08/03/20   Susy Frizzle, MD  metFORMIN (GLUCOPHAGE) 500 MG tablet TAKE 1 TABLET BY MOUTH TWICE DAILY WITH A MEAL 09/29/20   Susy Frizzle, MD  metoprolol tartrate (LOPRESSOR) 25 MG tablet Take 1 tablet (25 mg total) by mouth 2 (two) times daily. 07/15/20   Susy Frizzle, MD  ONETOUCH ULTRA test strip CHECK FASTING BLOOD SUGAR TWICE DAILY 07/26/20   Susy Frizzle, MD  OXYGEN Inhale 3 L into the lungs continuous.     [provider]  potassium chloride SA (KLOR-CON) 20 MEQ tablet Take 1 tablet by mouth once daily 11/24/20   Susy Frizzle, MD   pravastatin (PRAVACHOL) 80 MG tablet TAKE 1 TABLET BY MOUTH AT BEDTIME 08/30/20   Susy Frizzle, MD  pregabalin (LYRICA) 100 MG capsule TAKE 1 CAPSULE BY MOUTH TWICE DAILY. STOP GABAPENTIN 08/19/20   Susy Frizzle, MD  tamsulosin (FLOMAX) 0.4 MG CAPS capsule Take 1 capsule (0.4 mg total) by mouth daily. 05/14/20   Susy Frizzle, MD  traMADol (ULTRAM) 50 MG tablet TAKE 1 TABLET BY MOUTH EVERY 8 HOURS AS NEEDED FOR  SHINGLES  PAIN 08/03/20   Susy Frizzle, MD    Allergies    Ace inhibitors  Review of Systems   Review of Systems  Constitutional: Negative for chills, diaphoresis and fever.  Respiratory: Positive for shortness of breath.   Cardiovascular: Negative for chest pain and leg swelling.  Gastrointestinal: Negative for abdominal pain, diarrhea, nausea and vomiting.  Neurological: Negative for syncope, weakness and numbness.  All other systems reviewed and are negative.   Physical Exam Updated Vital Signs BP (!) 145/69   Pulse 90   Temp 97.7 F (36.5 C) (Oral)   Resp 19   Ht 5\' 5"  (1.651 m)   Wt 77.1 kg   SpO2 91%   BMI 28.29 kg/m   Physical Exam Vitals and nursing note reviewed.  Constitutional:      General: He is not in acute distress.    Appearance: He is well-developed. He is not diaphoretic.  HENT:     Head: Normocephalic and atraumatic.     Mouth/Throat:     Mouth: Mucous membranes are moist.     Pharynx: Oropharynx is clear.  Eyes:     Conjunctiva/sclera: Conjunctivae normal.  Cardiovascular:     Rate and Rhythm: Normal rate and regular rhythm.     Pulses: Normal pulses.          Radial pulses are 2+ on the right side and 2+ on the left side.       Posterior tibial pulses are 2+ on the right side and 2+ on the left side.     Heart sounds: Normal heart sounds.     Comments: Tactile temperature in the extremities appropriate and equal bilaterally. Pulmonary:     Effort: Tachypnea and respiratory distress present.     Breath sounds:  Wheezing present.     Comments: SPO2 89% on room air.  95%  with home supplemental O2. Abdominal:     Palpations: Abdomen is soft.     Tenderness: There is no abdominal tenderness. There is no guarding.  Musculoskeletal:     Cervical back: Neck supple.     Right lower leg: No edema.     Left lower leg: No edema.  Skin:    General: Skin is warm and dry.  Neurological:     Mental Status: He is alert.  Psychiatric:        Mood and Affect: Mood and affect normal.        Speech: Speech normal.        Behavior: Behavior normal.     ED Results / Procedures / Treatments   Labs (all labs ordered are listed, but only abnormal results are displayed) Labs Reviewed  BASIC METABOLIC PANEL - Abnormal; Notable for the following components:      Result Value   Sodium 130 (*)    Chloride 91 (*)    Glucose, Bld 483 (*)    BUN 74 (*)    Creatinine, Ser 1.51 (*)    GFR, Estimated 46 (*)    All other components within normal limits  CBC WITH DIFFERENTIAL/PLATELET - Abnormal; Notable for the following components:   RBC 3.27 (*)    Hemoglobin 9.7 (*)    HCT 29.2 (*)    All other components within normal limits  BLOOD GAS, VENOUS - Abnormal; Notable for the following components:   pO2, Ven 68.8 (*)    Acid-Base Excess 2.4 (*)    All other components within normal limits  RESP PANEL BY RT-PCR (FLU A&B, COVID) [id number]  MAGNESIUM    EKG EKG Interpretation  Date/Time:  Sunday December 19 2020 13:42:41 EDT Ventricular Rate:  90 PR Interval:    QRS Duration: 80 QT Interval:  360 QTC Calculation: 441 R Axis:   63 Text Interpretation: Sinus rhythm Baseline wander Nonspecific T wave abnormality Confirmed by Lajean Saver [phone]) on 12/19/2020 2:12:34 PM   Radiology DG Chest Portable 1 View  Result Date: 12/19/2020 CLINICAL DATA:  Shortness of breath, chest pain EXAM: PORTABLE CHEST 1 VIEW COMPARISON:  Chest radiograph dated 04/14/2020 FINDINGS: The heart size and mediastinal contours are within  normal limits. Mild interstitial prominence is unchanged and likely chronic. No focal consolidation, pleural effusion, or pneumothorax. The visualized skeletal structures are unremarkable. IMPRESSION: No active disease. Electronically Signed   By: Zerita Boers M.D.   On: 12/19/2020 14:51    Procedures Procedures   Medications Ordered in ED Medications  AeroChamber Plus Flo-Vu Large MISC 1 each (1 each Other Not Given 12/19/20 1429)  albuterol (VENTOLIN HFA) 108 (90 Base) MCG/ACT inhaler 6 puff (6 puffs Inhalation Given 12/19/20 1428)  ipratropium (ATROVENT HFA) inhaler 2 puff (2 puffs Inhalation Given 12/19/20 1510)  methylPREDNISolone sodium succinate (SOLU-MEDROL) 125 mg/2 mL injection 81 mg (81 mg Intravenous Given 12/19/20 1430)  albuterol (VENTOLIN HFA) 108 (90 Base) MCG/ACT inhaler 4 puff (4 puffs Inhalation Given 12/19/20 1512)  ipratropium-albuterol (DUONEB) 0.5-2.5 (3) MG/3ML nebulizer solution 3 mL (3 mLs Nebulization Given 12/19/20 1547)  insulin aspart (novoLOG) injection 5 Units (5 Units Subcutaneous Given 12/19/20 1656)    ED Course  I have reviewed the triage vital signs and the nursing notes.  Pertinent labs & imaging results that were available during my care of the patient were reviewed by me and considered in my medical decision making (see chart for details).  Clinical Course as of 12/19/20 1738  Sun Dec 19, 2020  1500 Sodium(!): 130 Corrects to 136 mEq/L Ron Parker, [id number]) or 139 mEq/L Kate Sable, 1999). [SJ]  1550 Hemoglobin(!): 9.7 Denies rectal bleeding/other sources of bleeding. Declined rectal exam/hemoccult.  He does not know what his baseline hemoglobin level is. [SJ]  5625 Patient states he feels much better.  Wheezing still present in all lung fields, however, patient states his breathing now feels to be at his baseline. States he would like to go home now. [SJ]  6389 Pt ambulated in room on 3L O2 via nasal canula. Patient sats remained above 98% during ambulation on 3  liters. [SJ]    Clinical Course User Index [SJ] Hoke Baer, Helane Gunther, PA-C   MDM Rules/Calculators/A&P                          Patient presents with shortness of breath.  Tachypneic with increased work of breathing and wheezes on initial exam, however, not hypoxic when on his baseline 3 L supplemental O2. Patient is nontoxic appearing, afebrile, not tachycardic, not hypotensive.   I have reviewed the patient's chart to obtain more information.   I reviewed and interpreted the patient's labs and radiological studies. Hyperglycemia, addressed with insulin here, will need to readdress with PCP.  No evidence of DKA. Patient improved with treatment here in the ED.  Ambulated while maintaining SPO2 98% and without complaint of shortness of breath. The patient was given instructions for home care as well as return precautions. Patient voices understanding of these instructions, accepts the plan, and is comfortable with discharge.  Findings and plan of care discussed with attending physician, Lajean Saver, MD. Dr. Ashok Cordia personally evaluated and examined this patient.   Vitals:   12/19/20 1600 12/19/20 1630 12/19/20 1700 12/19/20 1743  BP: 133/67 (!) 128/53 136/70 136/79  Pulse: 84 85 87 92  Resp: 19 (!) 24 15 16   Temp:      TempSrc:      SpO2: 99% 98% 98% 93%  Weight:      Height:         Final Clinical Impression(s) / ED Diagnoses Final diagnoses:  COPD exacerbation ([place])    Rx / DC Orders ED Discharge Orders         Ordered    predniSONE (DELTASONE) 10 MG tablet        12/19/20 1732           Lorayne Bender, PA-C 12/19/20 1812    Lajean Saver, MD 12/21/20 1627

## 2020-12-19 NOTE — ED Triage Notes (Signed)
Pt to er, pt states that he is here for some shortness of breath, states that he has a hx of copd, states that he shortness of breath started about two weeks ago, but got really bad on Friday.  Pt has audible expiratory wheezes.

## 2020-12-19 NOTE — Discharge Instructions (Addendum)
  Prednisone: Take the prednisone, as prescribed, until finished. If you are a diabetic, please know prednisone can raise your blood sugar temporarily.  You may need to increase the dose of your short acting insulin during this time.  Continue to use your home nebulizer treatments.  Please follow-up with your primary care provider on this matter as soon as possible.  You may need medication adjustments.  Return to the emergency department for recurrence of shortness of breath, onset of chest pain, abdominal pain, passing out, dizziness, or any other major concerns.  Your blood sugar was higher than normal. Please have this reevaluated by your primary care provider.

## 2020-12-24 ENCOUNTER — Other Ambulatory Visit: Payer: Self-pay

## 2020-12-24 ENCOUNTER — Ambulatory Visit (INDEPENDENT_AMBULATORY_CARE_PROVIDER_SITE_OTHER): Payer: Medicare Other | Admitting: Family Medicine

## 2020-12-24 ENCOUNTER — Encounter: Payer: Self-pay | Admitting: Family Medicine

## 2020-12-24 VITALS — BP 116/86 | HR 83 | Temp 97.1°F | Ht 65.0 in | Wt 160.8 lb

## 2020-12-24 DIAGNOSIS — J449 Chronic obstructive pulmonary disease, unspecified: Secondary | ICD-10-CM | POA: Diagnosis not present

## 2020-12-24 DIAGNOSIS — E1165 Type 2 diabetes mellitus with hyperglycemia: Secondary | ICD-10-CM | POA: Diagnosis not present

## 2020-12-24 NOTE — Progress Notes (Signed)
Subjective:    Patient ID: Edward [name], male    DOB: 12/01/1934, 86 y.o.   MRN: [id number]  HPI Patient has a history of COPD.  He ran out of his Symbicort at some point a few months ago.  Therefore he has not been taking any maintenance medication for his emphysema.  Saturday, the patient developed increasing shortness of breath and had to go to the emergency room where he was diagnosed with a COPD exacerbation.  He was discharged on prednisone, albuterol, and Atrovent.  He has been taking Atrovent 2-3 times a day along with albuterol.  He is also been on the prednisone.  He never got the antibiotic.  He states that his sputum has now cleared up.  He denies any purulent sputum.  He states that his breathing is better although not yet back to his baseline.  On exam today, he has diminished breath sounds in all 4 lung fields however there is no rhonchi or rails.  There is a few scattered expiratory wheezes but no significant wheezing.  He states he has been using 30 units of insulin daily however his sugars have been "high".  This seems to predate the prednisone. Past Medical History:  Diagnosis Date  . Allergy    Rhinitis  . Atrial fibrillation ([place])   . Bronchitis   . Chronic respiratory failure ([place])   . Colon polyps   . COPD (chronic obstructive pulmonary disease) ([place])   . Diabetes mellitus   . Elevated lipids   . Hypercholesterolemia   . Hypertension   . Iron deficiency anemia due to chronic blood loss 04/30/2018  . Noncompliance   . On home O2    2L N/C   . PSA elevation   . Pulmonary fibrosis ([place])   . Vitamin D deficiency    Past Surgical History:  Procedure Laterality Date  . BIOPSY  04/23/2018   Procedure: BIOPSY;  Surgeon: Danie Binder, MD;  Location: [facility];  Service: Endoscopy;;  duodenum gastric  . CATARACT EXTRACTION W/PHACO  06/25/2012   Procedure: CATARACT EXTRACTION PHACO AND INTRAOCULAR LENS PLACEMENT (IOC);  Surgeon: Elta Guadeloupe T. Gershon Crane, MD;  Location: [facility]  ORS;  Service: Ophthalmology;  Laterality: Left;  CDE=19.01  . CATARACT EXTRACTION W/PHACO  07/09/2012   Procedure: CATARACT EXTRACTION PHACO AND INTRAOCULAR LENS PLACEMENT (IOC);  Surgeon: Elta Guadeloupe T. Gershon Crane, MD;  Location: [facility] ORS;  Service: Ophthalmology;  Laterality: Right;  CDE: 20.09  . COLONOSCOPY WITH PROPOFOL N/A 04/23/2018   Procedure: COLONOSCOPY WITH PROPOFOL;  Surgeon: Danie Binder, MD;  Location: [facility];  Service: Endoscopy;  Laterality: N/A;  . ESOPHAGOGASTRODUODENOSCOPY (EGD) WITH PROPOFOL N/A 04/23/2018   Procedure: ESOPHAGOGASTRODUODENOSCOPY (EGD) WITH PROPOFOL;  Surgeon: Danie Binder, MD;  Location: [facility];  Service: Endoscopy;  Laterality: N/A;   Current Outpatient Medications on File Prior to Visit  Medication Sig Dispense Refill  . acetaminophen (TYLENOL) 500 MG tablet Take 500 mg by mouth every 6 (six) hours as needed for headache.    . albuterol (PROVENTIL) (2.5 MG/3ML) 0.083% nebulizer solution INHALE 1 VIAL VIA NEBULIZER EVERY 6 HOURS AS NEEDED FOR WHEEZING OR SHORTNESS OF BREATH 360 mL 4  . azithromycin (ZITHROMAX) 250 MG tablet Take 1 tablet (250 mg total) by mouth daily. Take first 2 tablets together, then 1 every day until finished. 6 tablet 0  . budesonide-formoterol (SYMBICORT) 160-4.5 MCG/ACT inhaler Inhale 2 puffs into the lungs 2 (two) times daily. 1 Inhaler 3  .  cloNIDine (CATAPRES) 0.1 MG tablet TAKE 1 TABLET BY MOUTH 2 TIMES A DAY 60 tablet 0  . ELIQUIS 5 MG TABS tablet Take 1 tablet by mouth twice daily 60 tablet 11  . furosemide (LASIX) 40 MG tablet TAKE 1 TABLET BY MOUTH DAILY 30 tablet 3  . gabapentin (NEURONTIN) 100 MG capsule TAKE 1 CAPSULE BY MOUTH THREE TIMES DAILY 90 capsule 0  . Insulin Glargine (BASAGLAR KWIKPEN) 100 UNIT/ML Inject 0.24 mLs (24 Units total) into the skin at bedtime. (Patient taking differently: Inject 30 Units into the skin at bedtime.) 10 pen 3  . ipratropium-albuterol (DUONEB) 0.5-2.5 (3) MG/3ML SOLN Take 3 mLs by  nebulization every 6 (six) hours as needed. 360 mL 1  . Lancets (ONETOUCH DELICA PLUS [id number]) MISC USE TO CHECK BLOOD SUGAR TWICE DAILY AS DIRECTED 100 each 0  . linezolid (ZYVOX) 600 MG tablet Take 1 tablet (600 mg total) by mouth 2 (two) times daily. 60 tablet 0  . losartan (COZAAR) 25 MG tablet Take 1 tablet (25 mg total) by mouth daily. 90 tablet 3  . metFORMIN (GLUCOPHAGE) 500 MG tablet TAKE 1 TABLET BY MOUTH TWICE DAILY WITH A MEAL 180 tablet 0  . metoprolol tartrate (LOPRESSOR) 25 MG tablet Take 1 tablet (25 mg total) by mouth 2 (two) times daily. 60 tablet 3  . ONETOUCH ULTRA test strip CHECK FASTING BLOOD SUGAR TWICE DAILY 50 strip 3  . OXYGEN Inhale 3 L into the lungs continuous.     . potassium chloride SA (KLOR-CON) 20 MEQ tablet Take 1 tablet by mouth once daily 30 tablet 0  . pravastatin (PRAVACHOL) 80 MG tablet TAKE 1 TABLET BY MOUTH AT BEDTIME 30 tablet 3  . predniSONE (DELTASONE) 10 MG tablet Take 4 tablets (40 mg total) by mouth daily for 3 days, THEN 3 tablets (30 mg total) daily for 1 day, THEN 2 tablets (20 mg total) daily for 1 day, THEN 1 tablet (10 mg total) daily for 1 day. 18 tablet 0  . pregabalin (LYRICA) 100 MG capsule TAKE 1 CAPSULE BY MOUTH TWICE DAILY. STOP GABAPENTIN 60 capsule 2  . tamsulosin (FLOMAX) 0.4 MG CAPS capsule Take 1 capsule (0.4 mg total) by mouth daily. 30 capsule 3  . traMADol (ULTRAM) 50 MG tablet TAKE 1 TABLET BY MOUTH EVERY 8 HOURS AS NEEDED FOR  SHINGLES  PAIN 60 tablet 0   No current facility-administered medications on file prior to visit.   Allergies  Allergen Reactions  . Ace Inhibitors Other (See Comments)    Hyperkalemia--07/23/2013:patient states not familiar with the following allergy   Social History   Socioeconomic History  . Marital status: Married    Spouse name: Not on file  . Number of children: Not on file  . Years of education: Not on file  . Highest education level: Not on file  Occupational History  . 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 Smoking status: Former Smoker    Packs/day: 1.50    Years: 60.00    Pack years: 90.00    Types: Cigarettes    Quit date: 12/31/2012    Years since quitting: 7.9  . Smokeless tobacco: Never Used  Vaping Use  . Vaping Use: Never used  Substance and Sexual Activity  . Alcohol use: No  . Drug use: No  . Sexual activity: Yes    Birth control/protection: None  Other Topics Concern  . Not on file  Social History Narrative  .  Not on file   Social Determinants of Health   Financial Resource Strain: Low Risk   . Difficulty of Paying Living Expenses: Not very hard  Food Insecurity: Not on file  Transportation Needs: No Transportation Needs  . Lack of Transportation (Medical): No  . Lack of Transportation (Non-Medical): No  Physical Activity: Not on file  Stress: Not on file  Social Connections: Not on file  Intimate Partner Violence: Not on file     Review of Systems  All other systems reviewed and are negative.      Objective:   Physical Exam Constitutional:      General: He is not in acute distress.    Appearance: He is not ill-appearing or toxic-appearing.  Cardiovascular:     Rate and Rhythm: Normal rate and regular rhythm.     Heart sounds: Normal heart sounds.  Pulmonary:     Effort: Pulmonary effort is normal.     Breath sounds: Decreased air movement present. Wheezing present. No rales.  Chest:     Chest wall: No tenderness.  Abdominal:     General: Bowel sounds are normal. There is no distension.     Palpations: Abdomen is soft.     Tenderness: There is no abdominal tenderness.  Musculoskeletal:     Right lower leg: No edema.     Left lower leg: No edema.  Skin:    Findings: No erythema or rash.  Neurological:     Mental Status: He is alert.           Assessment & Plan:  Uncontrolled type 2 diabetes mellitus with hyperglycemia ([name]) - Plan: Hemoglobin A1c, CBC with Differential/Platelet,  COMPLETE METABOLIC PANEL WITH GFR, Lipid panel, Microalbumin, urine  Chronic obstructive pulmonary disease, unspecified COPD type ([place])  Patient seems to be doing better now but I fear that once he stops the prednisone, his COPD is just going to rebound.  He is not on Symbicort or any preventative medication.  Therefore I gave the patient samples of Breztri.  He is to do 2 puffs inhaled twice a day.  This would replace the Symbicort and Atrovent.  He can still use albuterol as needed for wheezing.  I truly believe that he got on a maintenance medication and stay consistent and compliant with it, it would prevent unnecessary hospital and ER visits.  I will also check lab work pertaining to his diabetes as his sugars sound out of control.  Compliance is always been an issue with this patient.

## 2020-12-27 LAB — MICROALBUMIN, URINE

## 2020-12-27 LAB — COMPLETE METABOLIC PANEL WITH GFR
AG Ratio: 1.2 (calc) (ref 1.0–2.5)
ALT: 9 U/L (ref 9–46)
AST: 13 U/L (ref 10–35)
Albumin: 3.9 g/dL (ref 3.6–5.1)
Alkaline phosphatase (APISO): 96 U/L (ref 35–144)
BUN/Creatinine Ratio: 46 (calc) — ABNORMAL HIGH (ref 6–22)
BUN: 62 mg/dL — ABNORMAL HIGH (ref 7–25)
CO2: 32 mmol/L (ref 20–32)
Calcium: 9.4 mg/dL (ref 8.6–10.3)
Chloride: 89 mmol/L — ABNORMAL LOW (ref 98–110)
Creat: 1.36 mg/dL — ABNORMAL HIGH (ref 0.70–1.11)
GFR, Est African American: 55 mL/min/{1.73_m2} — ABNORMAL LOW (ref >=60)
GFR, Est Non African American: 48 mL/min/{1.73_m2} — ABNORMAL LOW (ref >=60)
Globulin: 3.2 g/dL (calc) (ref 1.9–3.7)
Glucose, Bld: 423 mg/dL — ABNORMAL HIGH (ref 65–99)
Potassium: 4.9 mmol/L (ref 3.5–5.3)
Sodium: 131 mmol/L — ABNORMAL LOW (ref 135–146)
Total Bilirubin: 0.3 mg/dL (ref 0.2–1.2)
Total Protein: 7.1 g/dL (ref 6.1–8.1)

## 2020-12-27 LAB — CBC WITH DIFFERENTIAL/PLATELET
Absolute Monocytes: 503 cells/uL (ref 200–950)
Basophils Absolute: 43 cells/uL (ref 0–200)
Basophils Relative: 0.4 %
Eosinophils Absolute: 225 cells/uL (ref 15–500)
Eosinophils Relative: 2.1 %
HCT: 34.1 % — ABNORMAL LOW (ref 38.5–50.0)
Hemoglobin: 10.8 g/dL — ABNORMAL LOW (ref 13.2–17.1)
Lymphs Abs: 1423 cells/uL (ref 850–3900)
MCH: 28.5 pg (ref 27.0–33.0)
MCHC: 31.7 g/dL — ABNORMAL LOW (ref 32.0–36.0)
MCV: 90 fL (ref 80.0–100.0)
MPV: 11.1 fL (ref 7.5–12.5)
Monocytes Relative: 4.7 %
Neutro Abs: 8507 cells/uL — ABNORMAL HIGH (ref 1500–7800)
Neutrophils Relative %: 79.5 %
Platelets: 327 10*3/uL (ref 140–400)
RBC: 3.79 10*6/uL — ABNORMAL LOW (ref 4.20–5.80)
RDW: 12 % (ref 11.0–15.0)
Total Lymphocyte: 13.3 %
WBC: 10.7 10*3/uL (ref 3.8–10.8)

## 2020-12-27 LAB — HEMOGLOBIN A1C
Hgb A1c MFr Bld: 13.4 % of total Hgb — ABNORMAL HIGH (ref <5.7)
Mean Plasma Glucose: 338 mg/dL
eAG (mmol/L): 18.7 mmol/L

## 2020-12-27 LAB — LIPID PANEL
Cholesterol: 209 mg/dL — ABNORMAL HIGH (ref <200)
HDL: 40 mg/dL (ref >=40)
Non-HDL Cholesterol (Calc): 169 mg/dL (calc) — ABNORMAL HIGH (ref <130)
Total CHOL/HDL Ratio: 5.2 (calc) — ABNORMAL HIGH (ref <5.0)
Triglycerides: 401 mg/dL — ABNORMAL HIGH (ref <150)

## 2020-12-31 DIAGNOSIS — J449 Chronic obstructive pulmonary disease, unspecified: Secondary | ICD-10-CM | POA: Diagnosis not present

## 2021-01-05 ENCOUNTER — Other Ambulatory Visit: Payer: Self-pay | Admitting: Family Medicine

## 2021-01-06 NOTE — Telephone Encounter (Signed)
Ok to refill??  Last office visit 12/24/2020.  Last refill 08/19/2020, #2 refills.

## 2021-01-07 ENCOUNTER — Telehealth: Payer: Self-pay | Admitting: Pharmacist

## 2021-01-07 NOTE — Progress Notes (Addendum)
Chronic Care Management Pharmacy Assistant   Name: Edward [name]  MRN: [id number] DOB: [birth year]  Reason for Encounter:General Disease State Call   Conditions to be addressed/monitored: Type II DM, Hypertension, hyperlipidemia, COPD, CKD stage III, AFIB, CHF.  Recent office visits:  12/24/20 Dr. Dennard Schaumann For Type 2 DM and COPD. STARTED samples Breztri 2 puffs inhale twice a day. Per note: Symbicort and Atrovent may be replaced with Breztri.  Recent consult visits:  None since 10/22/20  Hospital visits:  Medication Reconciliation was completed by comparing discharge summary, patient's EMR and Pharmacy list, and upon discussion with patient.  Went to the hospital on 12/19/20 due to COPD exacerbation. Discharge date was 12/19/20. Discharged from [facility]?Medications Started at [facility] Discharge:?? -started Azithromycin 250 mg ; take 2 tablets together then 1 every day until finished due to COPD. -started Prednisone 10 mg ; Take 4 tablets (40 mg total) by mouth daily for 3 days, THEN 3 tablets (30 mg total) daily for 1 day, THEN 2 tablets (20 mg total) daily for 1 day, THEN 1 tablet (10 mg total) daily for 1 day due to COPD.  Medication Changes at Hospital Discharge: -None  Medications Discontinued at Hospital Discharge: -None  Medications that remain the same after Hospital Discharge:??  -All other medications will remain the same.    Medications: Outpatient Encounter Medications as of 01/07/2021  Medication Sig   acetaminophen (TYLENOL) 500 MG tablet Take 500 mg by mouth every 6 (six) hours as needed for headache.   albuterol (PROVENTIL) (2.5 MG/3ML) 0.083% nebulizer solution INHALE 1 VIAL VIA NEBULIZER EVERY 6 HOURS AS NEEDED FOR WHEEZING OR SHORTNESS OF BREATH   azithromycin (ZITHROMAX) 250 MG tablet Take 1 tablet (250 mg total) by mouth daily. Take first 2 tablets together, then 1 every day until finished.   budesonide-formoterol  (SYMBICORT) 160-4.5 MCG/ACT inhaler Inhale 2 puffs into the lungs 2 (two) times daily.   cloNIDine (CATAPRES) 0.1 MG tablet TAKE 1 TABLET BY MOUTH 2 TIMES A DAY   ELIQUIS 5 MG TABS tablet Take 1 tablet by mouth twice daily   furosemide (LASIX) 40 MG tablet TAKE 1 TABLET BY MOUTH DAILY   gabapentin (NEURONTIN) 100 MG capsule TAKE 1 CAPSULE BY MOUTH THREE TIMES DAILY   Insulin Glargine (BASAGLAR KWIKPEN) 100 UNIT/ML Inject 0.24 mLs (24 Units total) into the skin at bedtime. (Patient taking differently: Inject 30 Units into the skin at bedtime.)   ipratropium-albuterol (DUONEB) 0.5-2.5 (3) MG/3ML SOLN Take 3 mLs by nebulization every 6 (six) hours as needed.   Lancets (ONETOUCH DELICA PLUS [id number]) MISC USE TO CHECK BLOOD SUGAR TWICE DAILY AS DIRECTED   linezolid (ZYVOX) 600 MG tablet Take 1 tablet (600 mg total) by mouth 2 (two) times daily.   losartan (COZAAR) 25 MG tablet Take 1 tablet (25 mg total) by mouth daily.   metFORMIN (GLUCOPHAGE) 500 MG tablet TAKE 1 TABLET BY MOUTH TWICE DAILY WITH A MEAL   metoprolol tartrate (LOPRESSOR) 25 MG tablet Take 1 tablet (25 mg total) by mouth 2 (two) times daily.   ONETOUCH ULTRA test strip CHECK FASTING BLOOD SUGAR TWICE DAILY   OXYGEN Inhale 3 L into the lungs continuous.    potassium chloride SA (KLOR-CON) 20 MEQ tablet Take 1 tablet by mouth once daily   pravastatin (PRAVACHOL) 80 MG tablet TAKE 1 TABLET BY MOUTH AT BEDTIME   pregabalin (LYRICA) 100 MG capsule TAKE 1 CAPSULE BY MOUTH TWICE DAILY  STOP  GABAPENTIN   tamsulosin (FLOMAX) 0.4 MG CAPS capsule Take 1 capsule (0.4 mg total) by mouth daily.   traMADol (ULTRAM) 50 MG tablet TAKE 1 TABLET BY MOUTH EVERY 8 HOURS AS NEEDED FOR  SHINGLES  PAIN   No facility-administered encounter medications on file as of 01/07/2021.   GEN CALL: Patients wife stated he doesn't do much, she stated his actively level is not good. He stated he eats anything around the house. She stated they eat a lot of fruits and  vegetables. I went over his medications with him and his wife and this is the list of medications below that he takes at home at this time. The first part is how the medication is prescribed and after the semi colon is how he is taking the medication. We spoke about other medications that are in his Epic chart and he stated he did not have any of those medications with him at home at this time. I explained to him and his wife that is best that he takes the medications as prescribed by the doctor unless its been changed by the doctor. They both voiced understanding.   Klor-con 20 mg 1 tablet daily ; Twice day Metformin 500 mg 1 tablet twice daily ; Twice a daily Furosemide 40 mg  1 tablet daily-; daily  Losartan 25 mg 1 tablet daily ; once daily  Metoprolol 25 mg 1 tablet twice daily ; twice daily Albuterol (per patient he was not sure how many he has but stated he uses them through out the day) Symbicort (per patient he was not sure  how many he has but stated he uses them through out the day) Insulin 24 units at bedtime; 30 units daily Lyrica 100 mg 1 tablet twice daily ; one tablet when he has pain Pravastatin 80 mg 1 tablet at bedtime ; one at bedtime Baby Aspirin 1 tablet in the morning    Star Rating Drugs: Losartan 25 mg 90 DS 11/03/20 , Metformin 500 mg 8 DS 12/29/20- 30 DS 12/06/20 , Pravastatin 80 mg 30 DS 12/28/20.   Follow-Up:Pharmacist Review  Charlann Lange, RMA Clinical Pharmacist Assistant [phone]  10 minutes spent in review, coordination, and documentation.  Patient still not taking Eliquis, have reached out multiple times to help patient with PAP or LIS.  Will try again for this year.  Reviewed by: Beverly Milch, PharmD Clinical Pharmacist [facility] [phone]

## 2021-01-26 ENCOUNTER — Other Ambulatory Visit: Payer: Self-pay | Admitting: Family Medicine

## 2021-01-26 DIAGNOSIS — I10 Essential (primary) hypertension: Secondary | ICD-10-CM

## 2021-01-28 ENCOUNTER — Other Ambulatory Visit: Payer: Self-pay | Admitting: Family Medicine

## 2021-01-30 DIAGNOSIS — J449 Chronic obstructive pulmonary disease, unspecified: Secondary | ICD-10-CM | POA: Diagnosis not present

## 2021-02-04 ENCOUNTER — Other Ambulatory Visit: Payer: Self-pay | Admitting: Family Medicine

## 2021-02-04 NOTE — Telephone Encounter (Signed)
Ok to refill??  Last office visit 12/24/2020.  Last refill 01/06/2021.  Ok to add refills to prescription?

## 2021-02-22 ENCOUNTER — Other Ambulatory Visit: Payer: Self-pay | Admitting: Family Medicine

## 2021-03-01 ENCOUNTER — Other Ambulatory Visit: Payer: Self-pay | Admitting: Family Medicine

## 2021-03-02 DIAGNOSIS — J449 Chronic obstructive pulmonary disease, unspecified: Secondary | ICD-10-CM | POA: Diagnosis not present

## 2021-03-18 ENCOUNTER — Encounter (HOSPITAL_COMMUNITY): Payer: Self-pay

## 2021-03-18 ENCOUNTER — Emergency Department (HOSPITAL_COMMUNITY): Payer: Medicare Other

## 2021-03-18 ENCOUNTER — Emergency Department (HOSPITAL_COMMUNITY)
Admission: EM | Admit: 2021-03-18 | Discharge: 2021-03-18 | Disposition: A | Discharge status: Home / Self Care | Payer: Medicare Other | Attending: Emergency Medicine | Admitting: Emergency Medicine

## 2021-03-18 ENCOUNTER — Other Ambulatory Visit: Payer: Self-pay

## 2021-03-18 DIAGNOSIS — R079 Chest pain, unspecified: Secondary | ICD-10-CM | POA: Diagnosis not present

## 2021-03-18 DIAGNOSIS — R918 Other nonspecific abnormal finding of lung field: Secondary | ICD-10-CM | POA: Diagnosis not present

## 2021-03-18 DIAGNOSIS — W01198A Fall on same level from slipping, tripping and stumbling with subsequent striking against other object, initial encounter: Secondary | ICD-10-CM | POA: Insufficient documentation

## 2021-03-18 DIAGNOSIS — E1165 Type 2 diabetes mellitus with hyperglycemia: Secondary | ICD-10-CM | POA: Insufficient documentation

## 2021-03-18 DIAGNOSIS — Y9301 Activity, walking, marching and hiking: Secondary | ICD-10-CM | POA: Insufficient documentation

## 2021-03-18 DIAGNOSIS — J449 Chronic obstructive pulmonary disease, unspecified: Secondary | ICD-10-CM | POA: Insufficient documentation

## 2021-03-18 DIAGNOSIS — I130 Hypertensive heart and chronic kidney disease with heart failure and stage 1 through stage 4 chronic kidney disease, or unspecified chronic kidney disease: Secondary | ICD-10-CM | POA: Insufficient documentation

## 2021-03-18 DIAGNOSIS — J984 Other disorders of lung: Secondary | ICD-10-CM | POA: Diagnosis not present

## 2021-03-18 DIAGNOSIS — Y92009 Unspecified place in unspecified non-institutional (private) residence as the place of occurrence of the external cause: Secondary | ICD-10-CM | POA: Diagnosis not present

## 2021-03-18 DIAGNOSIS — R519 Headache, unspecified: Secondary | ICD-10-CM | POA: Insufficient documentation

## 2021-03-18 DIAGNOSIS — I4891 Unspecified atrial fibrillation: Secondary | ICD-10-CM | POA: Diagnosis not present

## 2021-03-18 DIAGNOSIS — W19XXXA Unspecified fall, initial encounter: Secondary | ICD-10-CM

## 2021-03-18 DIAGNOSIS — M25552 Pain in left hip: Secondary | ICD-10-CM | POA: Diagnosis not present

## 2021-03-18 DIAGNOSIS — Z794 Long term (current) use of insulin: Secondary | ICD-10-CM | POA: Insufficient documentation

## 2021-03-18 DIAGNOSIS — I10 Essential (primary) hypertension: Secondary | ICD-10-CM | POA: Diagnosis not present

## 2021-03-18 DIAGNOSIS — N183 Chronic kidney disease, stage 3 unspecified: Secondary | ICD-10-CM | POA: Insufficient documentation

## 2021-03-18 DIAGNOSIS — Z7951 Long term (current) use of inhaled steroids: Secondary | ICD-10-CM | POA: Insufficient documentation

## 2021-03-18 DIAGNOSIS — R739 Hyperglycemia, unspecified: Secondary | ICD-10-CM

## 2021-03-18 DIAGNOSIS — Z7984 Long term (current) use of oral hypoglycemic drugs: Secondary | ICD-10-CM | POA: Diagnosis not present

## 2021-03-18 DIAGNOSIS — Z7901 Long term (current) use of anticoagulants: Secondary | ICD-10-CM | POA: Insufficient documentation

## 2021-03-18 DIAGNOSIS — Z87891 Personal history of nicotine dependence: Secondary | ICD-10-CM | POA: Diagnosis not present

## 2021-03-18 DIAGNOSIS — E1122 Type 2 diabetes mellitus with diabetic chronic kidney disease: Secondary | ICD-10-CM | POA: Insufficient documentation

## 2021-03-18 DIAGNOSIS — E1159 Type 2 diabetes mellitus with other circulatory complications: Secondary | ICD-10-CM | POA: Insufficient documentation

## 2021-03-18 DIAGNOSIS — I5033 Acute on chronic diastolic (congestive) heart failure: Secondary | ICD-10-CM | POA: Insufficient documentation

## 2021-03-18 DIAGNOSIS — Z79899 Other long term (current) drug therapy: Secondary | ICD-10-CM | POA: Insufficient documentation

## 2021-03-18 LAB — CBC WITH DIFFERENTIAL/PLATELET
Abs Immature Granulocytes: 0.03 10*3/uL (ref 0.00–0.07)
Basophils Absolute: 0 10*3/uL (ref 0.0–0.1)
Basophils Relative: 0 %
Eosinophils Absolute: 0.3 10*3/uL (ref 0.0–0.5)
Eosinophils Relative: 5 %
HCT: 30.9 % — ABNORMAL LOW (ref 39.0–52.0)
Hemoglobin: 10 g/dL — ABNORMAL LOW (ref 13.0–17.0)
Immature Granulocytes: 0 %
Lymphocytes Relative: 20 %
Lymphs Abs: 1.4 10*3/uL (ref 0.7–4.0)
MCH: 29.7 pg (ref 26.0–34.0)
MCHC: 32.4 g/dL (ref 30.0–36.0)
MCV: 91.7 fL (ref 80.0–100.0)
Monocytes Absolute: 0.5 10*3/uL (ref 0.1–1.0)
Monocytes Relative: 8 %
Neutro Abs: 4.6 10*3/uL (ref 1.7–7.7)
Neutrophils Relative %: 67 %
Platelets: 258 10*3/uL (ref 150–400)
RBC: 3.37 MIL/uL — ABNORMAL LOW (ref 4.22–5.81)
RDW: 12.6 % (ref 11.5–15.5)
WBC: 6.9 10*3/uL (ref 4.0–10.5)
nRBC: 0 % (ref 0.0–0.2)

## 2021-03-18 LAB — CBG MONITORING, ED: Glucose-Capillary: 370 mg/dL — ABNORMAL HIGH (ref 70–99)

## 2021-03-18 LAB — BASIC METABOLIC PANEL
Anion gap: 9 (ref 5–15)
BUN: 78 mg/dL — ABNORMAL HIGH (ref 8–23)
CO2: 28 mmol/L (ref 22–32)
Calcium: 9.2 mg/dL (ref 8.9–10.3)
Chloride: 93 mmol/L — ABNORMAL LOW (ref 98–111)
Creatinine, Ser: 1.71 mg/dL — ABNORMAL HIGH (ref 0.61–1.24)
GFR, Estimated: 39 mL/min — ABNORMAL LOW (ref >60)
Glucose, Bld: 448 mg/dL — ABNORMAL HIGH (ref 70–99)
Potassium: 4.7 mmol/L (ref 3.5–5.1)
Sodium: 130 mmol/L — ABNORMAL LOW (ref 135–145)

## 2021-03-18 MED ORDER — INSULIN ASPART 100 UNIT/ML IJ SOLN
5.0000 [IU] | Freq: Once | INTRAMUSCULAR | Status: AC
  Administered 2021-03-18: 5 [IU] via SUBCUTANEOUS
  Filled 2021-03-18: qty 1

## 2021-03-18 NOTE — ED Triage Notes (Signed)
Pt presents to ED for fall at home 2 days ago. Pt states he tripped and fell, hit the left side of his head on the ground, denies LOC. Pt states he is on blood thinners but he doesn't know what. Pt is unsure of what name is. Pt is suppose to be on 3L of O2 at home but came to the ED without his O2, states the tank was out. Sats initially 85%, placed on 3L of O2 sats increased to 91%.

## 2021-03-18 NOTE — Discharge Instructions (Signed)
Imaging was reassuring.  It is noted that patient has a high glucose of 448, please ensure that he is taking his insulin as prescribed and continue to monitor this closely.  Patient also appears to be slightly dehydrated my exam.  I recommend oral rehydration, I would like him to follow-up with his PCP in 1 week's time for recheck of his kidney function.  Come back to the emergency department if you develop chest pain, shortness of breath, severe abdominal pain, uncontrolled nausea, vomiting, diarrhea.

## 2021-03-18 NOTE — ED Provider Notes (Signed)
[facility] EMERGENCY DEPARTMENT Provider Note   CSN: [id number] Arrival date & time: 03/18/21  1555     History Chief Complaint  Patient presents with   Edward [name] is a 86 y.o. male.  HPI  Patient with significant medical history of atrial fib currently on anticoagulant, chronic respiratory failure currently on 2 to 3 L via nasal cannula, COPD, diabetes, hypertension, pulmonary fibrosis presents to the emergency department after having a mechanical fall.  Patient states 2 days ago he was outside walking around and unfortunate lost his balance fell onto his left side.  He states he hit his head, but denies losing conscious, he has no complaints of headaches, change in vision, paresthesias or weakness in the upper or lower extremities.  Does endorse that he has some pain on his left upper chest, states it comes and goes, worsens when he moves his arm, he denies pleuritic chest pain, denies productive cough or hemoptysis, also endorses that he has some slight left hip pain, pain comes and goes, hurts only when he is in certain movements.  Patient states he is able to ambulate after the incident, he has no other complaints at this time.  Patient denies alleviating factors.  Patient denies headaches, fevers, chills, shortness of breath or chest pain.  Past Medical History:  Diagnosis Date   Allergy    Rhinitis   Atrial fibrillation ([name])    Bronchitis    Chronic respiratory failure ([name])    Colon polyps    COPD (chronic obstructive pulmonary disease) ([place])    Diabetes mellitus    Elevated lipids    Hypercholesterolemia    Hypertension    Iron deficiency anemia due to chronic blood loss 04/30/2018   Noncompliance    On home O2    2L N/C    PSA elevation    Pulmonary fibrosis ([name])    Vitamin D deficiency     Patient Active Problem List   Diagnosis Date Noted   Sepsis due to methicillin resistant Staphylococcus aureus (MRSA) ([place]) 04/20/2020   MRSA bacteremia  04/16/2020   Acute kidney injury ([place]) 98/92/1194   Acute metabolic encephalopathy 17/40/8144   Altered mental status 03/23/2020   Normocytic anemia 03/23/2020   Mixed hyperlipidemia 03/06/2019   Parotitis 11/01/2018   Syncope 10/30/2018   Facial cellulitis 10/30/2018   Chronic respiratory failure with hypoxia ([place]) 10/30/2018   Chronic diastolic heart failure ([name])    Hypoxia    Atrial fibrillation ([place])    COPD with acute exacerbation ([place]) 07/31/2018   Hyponatremia 07/31/2018   COPD (chronic obstructive pulmonary disease) ([place]) 05/21/2018   Stage 3 chronic kidney disease ([place]) 81/85/6314   Diastolic congestive heart failure ([place]) 05/21/2018   Type 2 diabetes mellitus ([place]) 05/21/2018   HTN (hypertension) 05/21/2018   Iron deficiency anemia due to chronic blood loss 04/30/2018   Respiratory distress    Hematochezia    Goals of care, counseling/discussion    Palliative care by specialist    DNR (do not resuscitate) discussion    Acute renal failure with acute tubular necrosis superimposed on stage 3 chronic kidney disease ([place])    Palliative care encounter    COPD exacerbation ([place]) 04/17/2018   Acute on chronic diastolic CHF (congestive heart failure) ([name])    Severe anemia 02/17/2018   Bradycardia 02/17/2018   Hypothermia    Gastroesophageal reflux disease    Acute encephalopathy 09/25/2017   On home O2 09/25/2017   Bilateral lower extremity  edema 06/27/2017   Insomnia 03/28/2017   CKD (chronic kidney disease), stage III ([place]) 03/07/2017   AF (paroxysmal atrial fibrillation) ([name]) 03/05/2017   Chronic diastolic CHF (congestive heart failure) ([place]) 03/04/2017   HCAP (healthcare-associated pneumonia) 03/04/2017   Sepsis due to pneumonia ([place]) 02/25/2017   Constipation 02/25/2017   Overflow diarrhea/Constipation 02/25/2017   DM type 2 causing vascular disease ([place]) 10/11/2016   Non compliance w medication regimen 12/02/2015   Hypercholesterolemia 05/12/2014   Acute on  chronic respiratory failure with hypoxemia ([place]) 11/17/2013   Elevated PSA 01/20/2013   Hypertension    Elevated lipids    Pulmonary fibrosis ([place])    Colon polyps    Colon polyps     Past Surgical History:  Procedure Laterality Date   BIOPSY  04/23/2018   Procedure: BIOPSY;  Surgeon: Danie Binder, MD;  Location: [facility] ENDO SUITE;  Service: Endoscopy;;  duodenum gastric   CATARACT EXTRACTION W/PHACO  06/25/2012   Procedure: CATARACT EXTRACTION PHACO AND INTRAOCULAR LENS PLACEMENT ([place]);  Surgeon: Elta Guadeloupe T. Gershon Crane, MD;  Location: [facility] ORS;  Service: Ophthalmology;  Laterality: Left;  CDE=19.01   CATARACT EXTRACTION W/PHACO  07/09/2012   Procedure: CATARACT EXTRACTION PHACO AND INTRAOCULAR LENS PLACEMENT (IOC);  Surgeon: Elta Guadeloupe T. Gershon Crane, MD;  Location: [facility] ORS;  Service: Ophthalmology;  Laterality: Right;  CDE: 20.09   COLONOSCOPY WITH PROPOFOL N/A 04/23/2018   Procedure: COLONOSCOPY WITH PROPOFOL;  Surgeon: Danie Binder, MD;  Location: [facility] END[facility] SU[facility];  Service: Endoscopy;  Laterality: N/A;   ESOPHAGOGASTRODUODENOSCOPY (EGD) WITH PROPOFOL N/A 04/23/2018   Procedure: ESOPHAGOGASTRODUODENOSCOPY (EGD) WITH PROPOFOL;  Surgeon: Danie Binder, MD;  Location: [facility] END[facility] SU[facility];  Service: Endoscopy;  Laterality: N/A;       Family History  Problem Relation Age of Onset   Heart disease Mother    CAD Other    Diabetes Other     Social History   Tobacco Use   Smoking status: Former    Packs/day: 1.50    Years: 60.00    Pack years: 90.00    Types: Cigarettes    Quit date: 12/31/2012    Years since quitting: 8.2   Smokeless tobacco: Never  Vaping Use   Vaping Use: Never used  Substance Use Topics   Alcohol use: No   Drug use: No    Home Medications Prior to Admission medications   Medication Sig Start Date End Date Taking? Authorizing Provider  acetaminophen (TYLENOL) 500 MG tablet Take 500 mg by mouth every 6 (six) hours as needed for headache.    [provider]  albuterol  (PROVENTIL) (2.5 MG/3ML) 0.083% nebulizer solution INHALE 1 VIAL VIA NEBULIZER EVERY 6 HOURS AS NEEDED FOR WHEEZING OR SHORTNESS OF BREATH 08/12/19   Susy Frizzle, MD  azithromycin (ZITHROMAX) 250 MG tablet Take 1 tablet (250 mg total) by mouth daily. Take first 2 tablets together, then 1 every day until finished. 12/19/20   Joy, Shawn C, PA-C  budesonide-formoterol (SYMBICORT) 160-4.5 MCG/ACT inhaler Inhale 2 puffs into the lungs 2 (two) times daily. 01/13/20   Susy Frizzle, MD  cloNIDine (CATAPRES) 0.1 MG tablet TAKE 1 TABLET BY MOUTH 2 TIMES A DAY 01/27/21   Susy Frizzle, MD  ELIQUIS 5 MG TABS tablet Take 1 tablet by mouth twice daily 03/05/20   Susy Frizzle, MD  furosemide (LASIX) 40 MG tablet TAKE 1 TABLET BY MOUTH DAILY 11/04/20   Susy Frizzle, MD  gabapentin (NEURONTIN) 100 MG capsule TAKE 1  CAPSULE BY MOUTH THREE TIMES DAILY 09/15/20   Susy Frizzle, MD  Insulin Glargine [facility]) 100 UNIT/ML Inject 0.24 mLs (24 Units total) into the skin at bedtime. Patient taking differently: Inject 30 Units into the skin at bedtime. 04/19/20   Emokpae, Courage, MD  ipratropium-albuterol (DUONEB) 0.5-2.5 (3) MG/3ML SOLN Take 3 mLs by nebulization every 6 (six) hours as needed. 12/30/19   Susy Frizzle, MD  Lancets (ONETOUCH DELICA PLUS [id number]) MISC USE TO CHECK BLOOD SUGAR TWICE DAILY AS DIRECTED 12/08/20   Susy Frizzle, MD  linezolid (ZYVOX) 600 MG tablet Take 1 tablet (600 mg total) by mouth 2 (two) times daily. 04/19/20   Roxan Hockey, MD  losartan (COZAAR) 25 MG tablet Take 1 tablet (25 mg total) by mouth daily. 08/03/20   Susy Frizzle, MD  metFORMIN (GLUCOPHAGE) 500 MG tablet TAKE 1 TABLET BY MOUTH TWICE DAILY WITH A MEAL 09/29/20   Susy Frizzle, MD  metoprolol tartrate (LOPRESSOR) 25 MG tablet TAKE 1 TABLET BY MOUTH TWICE DAILY 01/27/21   Susy Frizzle, MD  [facility] ULTRA test strip CHECK FASTING BLOOD SUGAR TWICE DAILY 02/22/21   Susy Frizzle,  MD  OXYGEN Inhale 3 L into the lungs continuous.     [provider]  potassium chloride SA (KLOR-CON) 20 MEQ tablet Take 1 tablet by mouth once daily 03/01/21   Susy Frizzle, MD  pravastatin (PRAVACHOL) 80 MG tablet TAKE 1 TABLET BY MOUTH AT BEDTIME 08/30/20   Susy Frizzle, MD  pregabalin (LYRICA) 100 MG capsule TAKE 1 CAPSULE BY MOUTH TWICE DAILY (  STOP  GABAPENTIN) 02/04/21   Susy Frizzle, MD  tamsulosin (FLOMAX) 0.4 MG CAPS capsule Take 1 capsule (0.4 mg total) by mouth daily. 05/14/20   Susy Frizzle, MD  traMADol (ULTRAM) 50 MG tablet TAKE 1 TABLET BY MOUTH EVERY 8 HOURS AS NEEDED FOR  SHINGLES  PAIN 08/03/20   Susy Frizzle, MD    Allergies    Ace inhibitors  Review of Systems   Review of Systems  Constitutional:  Negative for chills and fever.  HENT:  Negative for congestion.   Respiratory:  Negative for shortness of breath.   Cardiovascular:  Negative for chest pain.  Gastrointestinal:  Negative for abdominal pain.  Genitourinary:  Negative for enuresis.  Musculoskeletal:  Negative for back pain and neck pain.       Pain on his left upper chest as well as left hip.  Skin:  Negative for rash.  Neurological:  Negative for dizziness and headaches.  Hematological:  Does not bruise/bleed easily.   Physical Exam Updated Vital Signs BP (!) 157/72   Pulse 70   Temp 98.3 F (36.8 C) (Oral)   Resp (!) 21   Ht 5\' 5"  (1.651 m)   Wt 68 kg   SpO2 94%   BMI 24.96 kg/m   Physical Exam Vitals and nursing note reviewed.  Constitutional:      General: He is not in acute distress.    Appearance: He is not ill-appearing.  HENT:     Head: Normocephalic and atraumatic.     Comments: There is no gross deformities present on exam, no battle sign or raccoon eyes present.  Head was nontender to palpation.    Nose: No congestion.  Eyes:     Conjunctiva/sclera: Conjunctivae normal.     Pupils: Pupils are equal, round, and reactive to light.  Cardiovascular:      Rate  and Rhythm: Normal rate and regular rhythm.     Pulses: Normal pulses.     Heart sounds: No murmur heard.   No friction rub. No gallop.     Comments: Chest was palpated was nontender to palpation, deformities present. Pulmonary:     Effort: No respiratory distress.     Breath sounds: No wheezing, rhonchi or rales.  Abdominal:     Palpations: Abdomen is soft.     Tenderness: There is no abdominal tenderness. There is no right CVA tenderness or left CVA tenderness.  Musculoskeletal:     Cervical back: No rigidity.     Comments: Spine was palpated nontender to palpation, no step-off or deformities present.  Has full range of motion in his upper and lower extremities, 5 5 strength neurovascular fully intact.  There is no noted internal or external  rotation, no leg shortening.  Hip was palpated there is no deformities present, nontender to palpation.  Skin:    General: Skin is warm and dry.  Neurological:     Mental Status: He is alert.  Psychiatric:        Mood and Affect: Mood normal.    ED Results / Procedures / Treatments   Labs (all labs ordered are listed, but only abnormal results are displayed) Labs Reviewed  BASIC METABOLIC PANEL - Abnormal; Notable for the following components:      Result Value   Sodium 130 (*)    Chloride 93 (*)    Glucose, Bld 448 (*)    BUN 78 (*)    Creatinine, Ser 1.71 (*)    GFR, Estimated 39 (*)    All other components within normal limits  CBC WITH DIFFERENTIAL/PLATELET - Abnormal; Notable for the following components:   RBC 3.37 (*)    Hemoglobin 10.0 (*)    HCT 30.9 (*)    All other components within normal limits  CBG MONITORING, ED - Abnormal; Notable for the following components:   Glucose-Capillary 370 (*)    All other components within normal limits    EKG EKG Interpretation  Date/Time:  Friday March 18 2021 17:26:59 EDT Ventricular Rate:  67 PR Interval:  180 QRS Duration: 90 QT Interval:  415 QTC  Calculation: 439 R Axis:   30 Text Interpretation: Sinus rhythm Borderline low voltage, extremity leads Confirmed by Fredia Sorrow [phone]) on 03/18/2021 5:28:44 PM  Radiology DG Ribs Unilateral W/Chest Left  Result Date: 03/18/2021 CLINICAL DATA:  Left-sided rib pain EXAM: LEFT RIBS AND CHEST - 3+ VIEW COMPARISON:  Chest x-ray 12/19/2020, 10/30/2018, 04/14/2020 FINDINGS: Single-view chest demonstrates no focal opacity or pleural effusion. Streaky scarring at the left base and right mid lung. Coarse chronic interstitial opacities. Normal cardiomediastinal silhouette with aortic atherosclerosis. No pneumothorax. Left rib series demonstrates no acute displaced left rib fracture. IMPRESSION: 1. Chronic interstitial lung opacities without acute airspace disease 2. No acute left rib fracture Electronically Signed   By: Donavan Foil M.D.   On: 03/18/2021 18:09   CT Head Wo Contrast  Result Date: 03/18/2021 CLINICAL DATA:  Recent fall with headaches, initial encounter EXAM: CT HEAD WITHOUT CONTRAST TECHNIQUE: Contiguous axial images were obtained from the base of the skull through the vertex without intravenous contrast. COMPARISON:  03/23/2020 FINDINGS: Brain: No evidence of acute infarction, hemorrhage, hydrocephalus, extra-axial collection or mass lesion/mass effect. Chronic atrophic and ischemic changes are noted stable from the prior exam. Vascular: No hyperdense vessel or unexpected calcification. Skull: Normal. Negative for fracture or focal lesion. Sinuses/Orbits:  No acute finding. Other: None. IMPRESSION: Chronic atrophic and ischemic changes stable from the previous exam Electronically Signed   By: Inez Catalina M.D.   On: 03/18/2021 18:21   DG Hip Unilat With Pelvis 2-3 Views Left  Result Date: 03/18/2021 CLINICAL DATA:  Left-sided hip pain, fall EXAM: DG HIP (WITH OR WITHOUT PELVIS) 2-3V LEFT COMPARISON:  None. FINDINGS: SI joints are non widened. Pubic symphysis and rami appear intact. No  fracture or malalignment. IMPRESSION: No acute osseous abnormality Electronically Signed   By: Donavan Foil M.D.   On: 03/18/2021 18:10    Procedures Procedures   Medications Ordered in ED Medications  insulin aspart (novoLOG) injection 5 Units (5 Units Subcutaneous Given 03/18/21 1900)    ED Course  I have reviewed the triage vital signs and the nursing notes.  Pertinent labs & imaging results that were available during my care of the patient were reviewed by me and considered in my medical decision making (see chart for details).    MDM Rules/Calculators/A&P                         Initial impression-patient presents after mechanical fall on blood thinners.  He is alert, does not appear in acute distress, vital signs reassuring.  Will obtain imaging of head, chest, hip, will obtain basic lab work-up and reassess.  Work-up-CBC shows normocytic anemia hemoglobin 10 appears to be baseline, BMP shows slight hyponatremia 130, hyperglycemia 448, elevated BUN of 78, creatinine 1.71.  CT head negative for acute findings.  Rib x-ray negative for acute findings, does show chronic interstitial lung opacities without acute airspace disease.  Unilateral pelvic x-ray negative for acute findings.  Reassessment-noted that patient had a glucose of 448, will provide patient with 5 units of NovoLog and reevaluate.  patient was reassessed, updated on lab work and imaging, he has no complaints this time, vital signs remained stable.  Patient agreeable for discharge.  Rule out-I have low suspicion for intracranial head bleed and/or CVA as there is no focal deficits present on my exam, CT head is negative for acute findings.  Low suspicion for spinal cord abnormality or spinal fracture as spine was palpated nontender to palpation, patient is moving all 4 extremities without difficulty.  Low suspicion for rib fracture or pneumothorax as lung sounds are clear bilaterally, x-rays negative for acute findings.  Low  suspicion for intra-abdominal trauma as abdomen soft nontender palpation.  Low suspicion for hip fracture as imaging is negative, there is no internal or external rotation on my exam, no leg shortening, patient is able to ambulate.  It is noted that patient has an elevated glucose.  This appears to be his baseline, there is no signs of DKA or HSS present, no anion gap, CO2 within normal limits, he is not tachycardic, denies generalized weakness, abdominal pain, nausea or vomiting.  It is also noted that patient has an elevated BUN baseline appears to be around 60, suspect he is slightly dehydrated as his creatinine is also slightly elevated.  will have him hydrate with at home.  Plan-  Fall-suspect patient had a mechanical fall, will recommend over-the-counter pain medications for pain, follow-up with PCP for reevaluation of his BUN and creatinine.  Vital signs have remained stable, no indication for hospital admission.  Patient discussed with attending and they agreed with assessment and plan.  Patient given at home care as well strict return precautions.  Patient verbalized that they understood agreed to  said plan.  Final Clinical Impression(s) / ED Diagnoses Final diagnoses:  Fall, initial encounter  Hyperglycemia    Rx / DC Orders ED Discharge Orders     None        Marcello Fennel, PA-C 03/18/21 1942    Fredia Sorrow, MD 03/23/21 1026

## 2021-03-18 NOTE — ED Notes (Signed)
Pt ambulated to BR with cane, no assistance needed, steady gait with no difficulty; O2 sats 94-97%

## 2021-03-18 NOTE — ED Notes (Signed)
Put pt on cardiac monitor 

## 2021-03-23 ENCOUNTER — Telehealth: Payer: Self-pay | Admitting: Family Medicine

## 2021-03-23 NOTE — Telephone Encounter (Signed)
No answer unable to leave message for patient to call back and schedule Medicare Annual Wellness Visit (AWV) in office.   If not able to come in office, please offer to do virtually or by telephone.   No history of  AWV: Per [employer] eligible for AWVI as of  10/02/2009  Please schedule at anytime with [facility]-Nurse Health Advisor.  If any questions, please contact me at [phone]

## 2021-03-24 ENCOUNTER — Encounter: Payer: Self-pay | Admitting: Family Medicine

## 2021-03-24 ENCOUNTER — Other Ambulatory Visit: Payer: Self-pay

## 2021-03-24 ENCOUNTER — Ambulatory Visit (INDEPENDENT_AMBULATORY_CARE_PROVIDER_SITE_OTHER): Payer: Medicare Other | Admitting: Family Medicine

## 2021-03-24 VITALS — BP 128/88 | HR 100 | Temp 97.9°F | Resp 18 | Ht 65.0 in | Wt 153.0 lb

## 2021-03-24 DIAGNOSIS — R410 Disorientation, unspecified: Secondary | ICD-10-CM | POA: Diagnosis not present

## 2021-03-24 DIAGNOSIS — E1165 Type 2 diabetes mellitus with hyperglycemia: Secondary | ICD-10-CM | POA: Diagnosis not present

## 2021-03-24 LAB — URINALYSIS, ROUTINE W REFLEX MICROSCOPIC
Bilirubin Urine: NEGATIVE
Hgb urine dipstick: NEGATIVE
Hyaline Cast: NONE SEEN /LPF
Ketones, ur: NEGATIVE
Leukocytes,Ua: NEGATIVE
Nitrite: NEGATIVE
Specific Gravity, Urine: 1.02 (ref 1.001–1.035)
pH: 5 (ref 5.0–8.0)

## 2021-03-24 LAB — MICROSCOPIC MESSAGE

## 2021-03-24 LAB — GLUCOSE 16585: Glucose: 181 mg/dL — ABNORMAL HIGH (ref 65–99)

## 2021-03-24 NOTE — Progress Notes (Signed)
Subjective:    Patient ID: Edward [name], male    DOB: 1934-11-17, 86 y.o.   MRN: [id number]  HPI Wt Readings from Last 3 Encounters:  03/24/21 153 lb (69.4 kg)  03/18/21 150 lb (68 kg)  12/24/20 160 lb 12.8 oz (72.9 kg)    When I saw the patient in March, hemoglobin A1c was over 13.  At that time I was concerned that he was not taking his insulin properly.  He was supposed to be on Lantus/Basaglar 30 units a day.  His son states that he has been giving him his Basaglar every day 30 units in the morning.  However his blood sugar at the hospital was over 400.  He went to the hospital 1 week ago after a fall.  Lab work in the hospital showed dehydration with a creatinine of 1.7.  He was given some insulin to bring his sugar down to 300.  At home, the patient's son states that his sugar was over 500 last night.  He called EMS but the patient declined transport to the emergency room.  He states that over the last week, he has been confused at home.  He is disoriented.  For instance, yesterday they went to town.  When they came home, the father simply sat in the car confused.  The son had to come back to the car to tell his father to get out.  Son states that in the morning his sugars are in the 200 range however in the evenings it can be highly variable.   He is also supposed to be on metformin.  With his renal function worsening however he would also be at higher risk for lactic acidosis.  Son states that he is consistently taking the 30 units of Basaglar.  Today the patient appears disheveled.  He smells of urine.  There are fecal stains on his T-shirt.  He is wearing his pants backwards.  I include this information because I believe it demonstrates that the patient is delirious and not at his baseline.  Urinalysis shows proteinuria as well as glucosuria but there is no obvious nitrites or leukocyte esterase to suggest a urinary tract infection.  Pulmonary exam is abnormal with his chronic wheezing but  there is no obvious crackles to suggest pneumonia.  Therefore I am suspicious that the patient may be delirious due to dehydration exacerbated by hyperglycemia and possible lactic acidosis  Past Medical History:  Diagnosis Date   Allergy    Rhinitis   Atrial fibrillation ([name])    Bronchitis    Chronic respiratory failure ([name])    Colon polyps    COPD (chronic obstructive pulmonary disease) ([name])    Diabetes mellitus    Elevated lipids    Hypercholesterolemia    Hypertension    Iron deficiency anemia due to chronic blood loss 04/30/2018   Noncompliance    On home O2    2L N/C    PSA elevation    Pulmonary fibrosis ([name])    Vitamin D deficiency    Past Surgical History:  Procedure Laterality Date   BIOPSY  04/23/2018   Procedure: BIOPSY;  Surgeon: Danie Binder, MD;  Location: [facility];  Service: Endoscopy;;  duodenum gastric   CATARACT EXTRACTION W/PHACO  06/25/2012   Procedure: CATARACT EXTRACTION PHACO AND INTRAOCULAR LENS PLACEMENT (Placer);  Surgeon: Elta Guadeloupe T. Gershon Crane, MD;  Location: [facility];  Service: Ophthalmology;  Laterality: Left;  CDE=19.01   CATARACT EXTRACTION W/PHACO  07/09/2012  Procedure: CATARACT EXTRACTION PHACO AND INTRAOCULAR LENS PLACEMENT (IOC);  Surgeon: Elta Guadeloupe T. Gershon Crane, MD;  Location: [facility];  Service: Ophthalmology;  Laterality: Right;  CDE: 20.09   COLONOSCOPY WITH PROPOFOL N/A 04/23/2018   Procedure: COLONOSCOPY WITH PROPOFOL;  Surgeon: Danie Binder, MD;  Location: [facility];  Service: Endoscopy;  Laterality: N/A;   ESOPHAGOGASTRODUODENOSCOPY (EGD) WITH PROPOFOL N/A 04/23/2018   Procedure: ESOPHAGOGASTRODUODENOSCOPY (EGD) WITH PROPOFOL;  Surgeon: Danie Binder, MD;  Location: [facility];  Service: Endoscopy;  Laterality: N/A;   Current Outpatient Medications on File Prior to Visit  Medication Sig Dispense Refill   acetaminophen (TYLENOL) 500 MG tablet Take 500 mg by mouth every 6 (six) hours as needed for headache.     albuterol (PROVENTIL)  (2.5 MG/3ML) 0.083% nebulizer solution INHALE 1 VIAL VIA NEBULIZER EVERY 6 HOURS AS NEEDED FOR WHEEZING OR SHORTNESS OF BREATH 360 mL 4   azithromycin (ZITHROMAX) 250 MG tablet Take 1 tablet (250 mg total) by mouth daily. Take first 2 tablets together, then 1 every day until finished. 6 tablet 0   budesonide-formoterol (SYMBICORT) 160-4.5 MCG/ACT inhaler Inhale 2 puffs into the lungs 2 (two) times daily. 1 Inhaler 3   cloNIDine (CATAPRES) 0.1 MG tablet TAKE 1 TABLET BY MOUTH 2 TIMES A DAY 60 tablet 0   ELIQUIS 5 MG TABS tablet Take 1 tablet by mouth twice daily 60 tablet 11   furosemide (LASIX) 40 MG tablet TAKE 1 TABLET BY MOUTH DAILY 30 tablet 3   gabapentin (NEURONTIN) 100 MG capsule TAKE 1 CAPSULE BY MOUTH THREE TIMES DAILY 90 capsule 0   Insulin Glargine (BASAGLAR KWIKPEN) 100 UNIT/ML Inject 0.24 mLs (24 Units total) into the skin at bedtime. (Patient taking differently: Inject 30 Units into the skin at bedtime.) 10 pen 3   ipratropium-albuterol (DUONEB) 0.5-2.5 (3) MG/3ML SOLN Take 3 mLs by nebulization every 6 (six) hours as needed. 360 mL 1   Lancets (ONETOUCH DELICA PLUS [id number]) MISC USE TO CHECK BLOOD SUGAR TWICE DAILY AS DIRECTED 100 each 0   linezolid (ZYVOX) 600 MG tablet Take 1 tablet (600 mg total) by mouth 2 (two) times daily. 60 tablet 0   losartan (COZAAR) 25 MG tablet Take 1 tablet (25 mg total) by mouth daily. 90 tablet 3   metFORMIN (GLUCOPHAGE) 500 MG tablet TAKE 1 TABLET BY MOUTH TWICE DAILY WITH A MEAL 180 tablet 0   metoprolol tartrate (LOPRESSOR) 25 MG tablet TAKE 1 TABLET BY MOUTH TWICE DAILY 60 tablet 3   ONETOUCH ULTRA test strip CHECK FASTING BLOOD SUGAR TWICE DAILY 50 strip 3   OXYGEN Inhale 3 L into the lungs continuous.      potassium chloride SA (KLOR-CON) 20 MEQ tablet Take 1 tablet by mouth once daily 90 tablet 0   pravastatin (PRAVACHOL) 80 MG tablet TAKE 1 TABLET BY MOUTH AT BEDTIME 30 tablet 3   pregabalin (LYRICA) 100 MG capsule TAKE 1 CAPSULE BY MOUTH  TWICE DAILY (  STOP  GABAPENTIN) 60 capsule 3   tamsulosin (FLOMAX) 0.4 MG CAPS capsule Take 1 capsule (0.4 mg total) by mouth daily. 30 capsule 3   traMADol (ULTRAM) 50 MG tablet TAKE 1 TABLET BY MOUTH EVERY 8 HOURS AS NEEDED FOR  SHINGLES  PAIN 60 tablet 0   No current facility-administered medications on file prior to visit.   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  Tobacco Use   Smoking status: Former    Packs/day: 1.50    Years: 60.00    Pack years: 90.00    Types: Cigarettes    Quit date: 12/31/2012    Years since quitting: 8.2   Smokeless tobacco: Never  Vaping Use   Vaping Use: Never used  Substance and Sexual Activity   Alcohol use: No   Drug use: No   Sexual activity: Yes    Birth control/protection: None  Other Topics Concern   Not on file  Social History Narrative   Not on file   Social Determinants of Health   Financial Resource Strain: Not on file  Food Insecurity: Not on file  Transportation Needs: No Transportation Needs   Lack of Transportation (Medical): No   Lack of Transportation (Non-Medical): No  Physical Activity: Not on file  Stress: Not on file  Social Connections: Not on file  Intimate Partner Violence: Not on file     Review of Systems     Objective:   Physical Exam Vitals reviewed.  Constitutional:      Appearance: He is obese. He is not ill-appearing (Patient is wearing his pants backward.  Smells of urine.) or toxic-appearing.  HENT:     Mouth/Throat:     Mouth: Mucous membranes are dry.  Cardiovascular:     Rate and Rhythm: Normal rate.  Rhythm irregular.     Heart sounds: Normal heart sounds.  Pulmonary:     Effort: Pulmonary effort is normal. No respiratory distress.     Breath sounds: No stridor. Wheezing present. No rhonchi.  Abdominal:     General: Abdomen is flat. There is no distension.     Palpations: Abdomen is soft.     Tenderness: There is no abdominal tenderness. There is no guarding.  Musculoskeletal:     Cervical back: Neck supple.     Right lower leg: No edema.     Left lower leg: No edema.  Lymphadenopathy:     Cervical: No cervical adenopathy.  Neurological:     Mental Status: He is alert.          Assessment & Plan:  Confusion - Plan: Urinalysis, Routine w reflex microscopic  Uncontrolled type 2 diabetes mellitus with hyperglycemia ([name]) - Plan: Urinalysis, Routine w reflex microscopic, CBC with Differential/Platelet, COMPLETE METABOLIC PANEL WITH GFR, Hemoglobin A1c  No obvious urinary tract infection or pneumonia on exam today.  However the patient is clearly not at his baseline.  He is disheveled and confused.  He is incontinent.  We had to reorient the patient several times to provide a urine sample as he was unable to follow basic commands..  There is no obvious problem on his exam other than he is dehydrated along with being confused.  I suspect delirium due to dehydration from acute renal insufficiency coupled with hyperglycemia from poorly controlled diabetes mellitus.  There also may be an element of lactic acidosis.  Random blood sugar today is 189.  Although elevated this is not high enough to explain confusion.  Therefore I am left with dehydration to be the only obvious source on his exam.  I will obtain a  CBC CMP and lactic acid level.  We will give the patient IV fluids in an effort to try to keep him out of the hospital.  I recommended the son increase Basaglar to 45 units a day.  Check blood sugars first thing in the morning and 2 hours postprandial and recheck next week to see if the  patient is improving.  Obtain lab work as discussed.  Patient may need to go to the hospital if his lab work shows any life-threatening abnormalities.  Otherwise we will try to improve hydration and decrease hyperglycemia and see if his confusion will improve

## 2021-03-25 ENCOUNTER — Telehealth: Payer: Self-pay | Admitting: Pharmacist

## 2021-03-25 LAB — COMPLETE METABOLIC PANEL WITH GFR
AG Ratio: 1.4 (calc) (ref 1.0–2.5)
ALT: 9 U/L (ref 9–46)
AST: 12 U/L (ref 10–35)
Albumin: 4.1 g/dL (ref 3.6–5.1)
Alkaline phosphatase (APISO): 107 U/L (ref 35–144)
BUN/Creatinine Ratio: 46 (calc) — ABNORMAL HIGH (ref 6–22)
BUN: 76 mg/dL — ABNORMAL HIGH (ref 7–25)
CO2: 26 mmol/L (ref 20–32)
Calcium: 10.5 mg/dL — ABNORMAL HIGH (ref 8.6–10.3)
Chloride: 99 mmol/L (ref 98–110)
Creat: 1.67 mg/dL — ABNORMAL HIGH (ref 0.70–1.11)
GFR, Est African American: 43 mL/min/{1.73_m2} — ABNORMAL LOW (ref >=60)
GFR, Est Non African American: 37 mL/min/{1.73_m2} — ABNORMAL LOW (ref >=60)
Globulin: 2.9 g/dL (calc) (ref 1.9–3.7)
Glucose, Bld: 202 mg/dL — ABNORMAL HIGH (ref 65–99)
Potassium: 4.5 mmol/L (ref 3.5–5.3)
Sodium: 137 mmol/L (ref 135–146)
Total Bilirubin: 0.3 mg/dL (ref 0.2–1.2)
Total Protein: 7 g/dL (ref 6.1–8.1)

## 2021-03-25 LAB — HEMOGLOBIN A1C
Hgb A1c MFr Bld: 12.6 % of total Hgb — ABNORMAL HIGH (ref <5.7)
Mean Plasma Glucose: 315 mg/dL
eAG (mmol/L): 17.4 mmol/L

## 2021-03-25 LAB — LACTIC ACID, PLASMA: LACTIC ACID: 1.4 mmol/L (ref 0.4–1.8)

## 2021-03-25 LAB — CBC WITH DIFFERENTIAL/PLATELET
Absolute Monocytes: 720 cells/uL (ref 200–950)
Basophils Absolute: 48 cells/uL (ref 0–200)
Basophils Relative: 0.6 %
Eosinophils Absolute: 328 cells/uL (ref 15–500)
Eosinophils Relative: 4.1 %
HCT: 36.7 % — ABNORMAL LOW (ref 38.5–50.0)
Hemoglobin: 12 g/dL — ABNORMAL LOW (ref 13.2–17.1)
Lymphs Abs: 2256 cells/uL (ref 850–3900)
MCH: 28.3 pg (ref 27.0–33.0)
MCHC: 32.7 g/dL (ref 32.0–36.0)
MCV: 86.6 fL (ref 80.0–100.0)
MPV: 11 fL (ref 7.5–12.5)
Monocytes Relative: 9 %
Neutro Abs: 4648 cells/uL (ref 1500–7800)
Neutrophils Relative %: 58.1 %
Platelets: 367 10*3/uL (ref 140–400)
RBC: 4.24 10*6/uL (ref 4.20–5.80)
RDW: 12.9 % (ref 11.0–15.0)
Total Lymphocyte: 28.2 %
WBC: 8 10*3/uL (ref 3.8–10.8)

## 2021-03-25 NOTE — Progress Notes (Addendum)
Chronic Care Management Pharmacy Assistant   Name: Edward [name]  MRN: [id number] DOB: 23-Jul-1935  Reason for Encounter: Disease State For DM.   Conditions to be addressed/monitored: Type II DM, Hypertension, hyperlipidemia, COPD, CKD stage III, AFIB, CHF.  Recent office visits:  03/24/21 Dr. Dennard Schaumann. For Confusion/Follow-up. No medication changes.   Recent consult visits:  None since 01/07/21  Hospital visits:  03/18/21 [facility] ( 3 Hours) Fredia Sorrow, MD. For fall.   Medications: Outpatient Encounter Medications as of 03/25/2021  Medication Sig   acetaminophen (TYLENOL) 500 MG tablet Take 500 mg by mouth every 6 (six) hours as needed for headache.   albuterol (PROVENTIL) (2.5 MG/3ML) 0.083% nebulizer solution INHALE 1 VIAL VIA NEBULIZER EVERY 6 HOURS AS NEEDED FOR WHEEZING OR SHORTNESS OF BREATH   azithromycin (ZITHROMAX) 250 MG tablet Take 1 tablet (250 mg total) by mouth daily. Take first 2 tablets together, then 1 every day until finished.   budesonide-formoterol (SYMBICORT) 160-4.5 MCG/ACT inhaler Inhale 2 puffs into the lungs 2 (two) times daily.   cloNIDine (CATAPRES) 0.1 MG tablet TAKE 1 TABLET BY MOUTH 2 TIMES A DAY   ELIQUIS 5 MG TABS tablet Take 1 tablet by mouth twice daily   furosemide (LASIX) 40 MG tablet TAKE 1 TABLET BY MOUTH DAILY   gabapentin (NEURONTIN) 100 MG capsule TAKE 1 CAPSULE BY MOUTH THREE TIMES DAILY   Insulin Glargine (BASAGLAR KWIKPEN) 100 UNIT/ML Inject 0.24 mLs (24 Units total) into the skin at bedtime. (Patient taking differently: Inject 30 Units into the skin at bedtime.)   ipratropium-albuterol (DUONEB) 0.5-2.5 (3) MG/3ML SOLN Take 3 mLs by nebulization every 6 (six) hours as needed.   Lancets (ONETOUCH DELICA PLUS [id number]) MISC USE TO CHECK BLOOD SUGAR TWICE DAILY AS DIRECTED   linezolid (ZYVOX) 600 MG tablet Take 1 tablet (600 mg total) by mouth 2 (two) times daily.   losartan (COZAAR) 25 MG tablet Take 1 tablet (25  mg total) by mouth daily.   metFORMIN (GLUCOPHAGE) 500 MG tablet TAKE 1 TABLET BY MOUTH TWICE DAILY WITH A MEAL   metoprolol tartrate (LOPRESSOR) 25 MG tablet TAKE 1 TABLET BY MOUTH TWICE DAILY   ONETOUCH ULTRA test strip CHECK FASTING BLOOD SUGAR TWICE DAILY   OXYGEN Inhale 3 L into the lungs continuous.    potassium chloride SA (KLOR-CON) 20 MEQ tablet Take 1 tablet by mouth once daily   pravastatin (PRAVACHOL) 80 MG tablet TAKE 1 TABLET BY MOUTH AT BEDTIME   pregabalin (LYRICA) 100 MG capsule TAKE 1 CAPSULE BY MOUTH TWICE DAILY (  STOP  GABAPENTIN)   tamsulosin (FLOMAX) 0.4 MG CAPS capsule Take 1 capsule (0.4 mg total) by mouth daily.   traMADol (ULTRAM) 50 MG tablet TAKE 1 TABLET BY MOUTH EVERY 8 HOURS AS NEEDED FOR  SHINGLES  PAIN   No facility-administered encounter medications on file as of 03/25/2021.    Recent Relevant Labs: Lab Results  Component Value Date/Time   HGBA1C 12.6 (H) 03/24/2021 12:33 PM   HGBA1C 13.4 (H) 12/24/2020 08:29 AM   HGBA1C >14.0 (H) 12/01/2015 08:31 AM   HGBA1C >14.0 (H) 05/27/2015 08:54 AM   MICROALBUR CANCELED 12/24/2020 08:29 AM   MICROALBUR 378.4 01/23/2020 03:09 PM    Kidney Function Lab Results  Component Value Date/Time   CREATININE 1.67 (H) 03/24/2021 12:33 PM   CREATININE 1.71 (H) 03/18/2021 05:39 PM   CREATININE 1.36 (H) 12/24/2020 08:29 AM   GFRNONAA 37 (L) 03/24/2021 12:33 PM  GFRAA 43 (L) 03/24/2021 12:33 PM    Current antihyperglycemic regimen:  Metformin 500 mg 1 tablet twice daily.   What recent interventions/DTPs have been made to improve glycemic control:  None.  Have there been any recent hospitalizations or ED visits since last visit with [facility]PP? Yes, Documented above.   Patient denies hypoglycemic symptoms, including None  Patient denies hyperglycemic symptoms, including none  How often are you checking your blood sugar? Patient stated once daily  What are your blood sugars ranging? Patient stated his blood sugar  readings range around 130's-140's  During the week, how often does your blood glucose drop below 70? Patient stated Never  Are you checking your feet daily/regularly?  Patient stated he checks his feet regularly.   Adherence Review: Is the patient currently on a STATIN medication? Pravastatin 80 mg   Is the patient currently on ACE/ARB medication? Losartan 25 mg  Does the patient have >5 day gap between last estimated fill dates? Per misc rpts, no.  Star Rating Drugs: Metformin 500 mg 8 DS 12/29/20, Gabapentin 100 mg 30 DS 07/25/20, Pravastatin 80 mg 30 DS 01/26/21, Losartan 25 mg 90 DS 01/28/21.   Patient stated he is taking his metformin 500 mg regularly, He stated he gets his metformin medication from [facility] in [place].   Follow-Up:Pharmacist Review   Charlann Lange, RMA Clinical Pharmacist Assistant [phone]  10 minutes spent in review, coordination, and documentation.  Reviewed by: Beverly Milch, PharmD Clinical Pharmacist [facility] [phone]

## 2021-03-28 ENCOUNTER — Other Ambulatory Visit: Payer: Self-pay | Admitting: *Deleted

## 2021-03-28 MED ORDER — BASAGLAR KWIKPEN 100 UNIT/ML ~~LOC~~ SOPN: 45.0000 [IU] | PEN_INJECTOR | Freq: Every day | SUBCUTANEOUS | 0 refills | Status: DC

## 2021-03-30 ENCOUNTER — Other Ambulatory Visit: Payer: Self-pay

## 2021-03-30 ENCOUNTER — Encounter: Payer: Self-pay | Admitting: Nurse Practitioner

## 2021-03-30 ENCOUNTER — Ambulatory Visit (INDEPENDENT_AMBULATORY_CARE_PROVIDER_SITE_OTHER): Payer: Medicare Other | Admitting: Nurse Practitioner

## 2021-03-30 VITALS — BP 116/76 | HR 71 | Temp 98.4°F | Ht 65.0 in | Wt 150.0 lb

## 2021-03-30 DIAGNOSIS — R0789 Other chest pain: Secondary | ICD-10-CM | POA: Insufficient documentation

## 2021-03-30 DIAGNOSIS — R0781 Pleurodynia: Secondary | ICD-10-CM

## 2021-03-30 DIAGNOSIS — E1159 Type 2 diabetes mellitus with other circulatory complications: Secondary | ICD-10-CM

## 2021-03-30 MED ORDER — TRAMADOL HCL 50 MG PO TABS: 50.0000 mg | ORAL_TABLET | Freq: Two times a day (BID) | ORAL | 0 refills | Status: DC | PRN

## 2021-03-30 MED ORDER — LIDOCAINE 4 % EX PTCH: 1.0000 | MEDICATED_PATCH | Freq: Every day | CUTANEOUS | 3 refills | Status: DC

## 2021-03-30 NOTE — Progress Notes (Signed)
Subjective:    Patient ID: Edward [name], male    DOB: 08-Mar-1935, 86 y.o.   MRN: [id number]  HPI: Edward [name] is a 86 y.o. male presenting with son for follow up.   Chief Complaint  Patient presents with   Diabetes    Numbers have been very high, has log of fasting blood sugars pt is asking for pain meds, the lyrica is not working. Due to fall having pain in the rib area, asking for oxycodone  Blood sugar on  6/28-466  6/29- 110 - fasting After eating 177  Also asking for ensure, will chk to see if we have boost, less sugar    DIABETES Has been taking Levemir 45 units at night time.  Son brought blood sugar log for yesterday and this morning.  Yesterday, blood sugars were 400 or higher.  Today it has been less than 200.  Son reports patient is drinking a lot of milk.  They did not bring the blood sugar log for the other days as instructed, but patient thinks they have been greater than 200 on average. Hypoglycemic episodes:no Polydipsia/polyuria: no Visual disturbance: no Chest pain: no Paresthesias: no Glucose Monitoring: yes  Accucheck frequency: multiple times daily  Fasting glucose: ~200, today 110 Taking Insulin?: yes  Long acting insulin: Levemir 35 units  Short acting insulin: N/A  Patient son is also asking for something for pain for the patient.  He reports that since his fall about 1 month ago, he has been complaining of pain in his ribs.  He has tried Tylenol without relief.  Allergies  Allergen Reactions   Ace Inhibitors Other (See Comments)    Hyperkalemia--07/23/2013:patient states not familiar with the following allergy    Outpatient Encounter Medications as of 03/30/2021  Medication Sig   acetaminophen (TYLENOL) 500 MG tablet Take 500 mg by mouth every 6 (six) hours as needed for headache.   albuterol (PROVENTIL) (2.5 MG/3ML) 0.083% nebulizer solution INHALE 1 VIAL VIA NEBULIZER EVERY 6 HOURS AS NEEDED FOR WHEEZING OR SHORTNESS OF BREATH    budesonide-formoterol (SYMBICORT) 160-4.5 MCG/ACT inhaler Inhale 2 puffs into the lungs 2 (two) times daily.   cloNIDine (CATAPRES) 0.1 MG tablet TAKE 1 TABLET BY MOUTH 2 TIMES A DAY   ELIQUIS 5 MG TABS tablet Take 1 tablet by mouth twice daily   furosemide (LASIX) 40 MG tablet TAKE 1 TABLET BY MOUTH DAILY   gabapentin (NEURONTIN) 100 MG capsule TAKE 1 CAPSULE BY MOUTH THREE TIMES DAILY   Insulin Glargine (BASAGLAR KWIKPEN) 100 UNIT/ML Inject 45 Units into the skin at bedtime.   ipratropium-albuterol (DUONEB) 0.5-2.5 (3) MG/3ML SOLN Take 3 mLs by nebulization every 6 (six) hours as needed.   Lancets (ONETOUCH DELICA PLUS [id number]) MISC USE TO CHECK BLOOD SUGAR TWICE DAILY AS DIRECTED   Lidocaine (HM LIDOCAINE PATCH) 4 % PTCH Apply 1 patch topically daily.   linezolid (ZYVOX) 600 MG tablet Take 1 tablet (600 mg total) by mouth 2 (two) times daily.   losartan (COZAAR) 25 MG tablet Take 1 tablet (25 mg total) by mouth daily.   metFORMIN (GLUCOPHAGE) 500 MG tablet TAKE 1 TABLET BY MOUTH TWICE DAILY WITH A MEAL   metoprolol tartrate (LOPRESSOR) 25 MG tablet TAKE 1 TABLET BY MOUTH TWICE DAILY   ONETOUCH ULTRA test strip CHECK FASTING BLOOD SUGAR TWICE DAILY   OXYGEN Inhale 3 L into the lungs continuous.    potassium chloride SA (KLOR-CON) 20 MEQ tablet Take 1 tablet by mouth  once daily   pravastatin (PRAVACHOL) 80 MG tablet TAKE 1 TABLET BY MOUTH AT BEDTIME   pregabalin (LYRICA) 100 MG capsule TAKE 1 CAPSULE BY MOUTH TWICE DAILY (  STOP  GABAPENTIN)   tamsulosin (FLOMAX) 0.4 MG CAPS capsule Take 1 capsule (0.4 mg total) by mouth daily.   traMADol (ULTRAM) 50 MG tablet Take 1 tablet (50 mg total) by mouth every 12 (twelve) hours as needed.   [DISCONTINUED] traMADol (ULTRAM) 50 MG tablet TAKE 1 TABLET BY MOUTH EVERY 8 HOURS AS NEEDED FOR  SHINGLES  PAIN   [DISCONTINUED] azithromycin (ZITHROMAX) 250 MG tablet Take 1 tablet (250 mg total) by mouth daily. Take first 2 tablets together, then 1 every day  until finished.   No facility-administered encounter medications on file as of 03/30/2021.    Patient Active Problem List   Diagnosis Date Noted   Rib pain on right side 03/30/2021   Sepsis due to methicillin resistant Staphylococcus aureus (MRSA) ([place]) 04/20/2020   MRSA bacteremia 04/16/2020   Acute kidney injury ([place]) 17/40/8144   Acute metabolic encephalopathy 81/85/6314   Altered mental status 03/23/2020   Normocytic anemia 03/23/2020   Mixed hyperlipidemia 03/06/2019   Parotitis 11/01/2018   Syncope 10/30/2018   Facial cellulitis 10/30/2018   Chronic respiratory failure with hypoxia ([place]) 10/30/2018   Chronic diastolic heart failure ([name])    Hypoxia    Atrial fibrillation ([place])    COPD with acute exacerbation ([place]) 07/31/2018   Hyponatremia 07/31/2018   COPD (chronic obstructive pulmonary disease) ([place]) 05/21/2018   Stage 3 chronic kidney disease ([place]) 97/11/6376   Diastolic congestive heart failure ([place]) 05/21/2018   Type 2 diabetes mellitus ([place]) 05/21/2018   HTN (hypertension) 05/21/2018   Iron deficiency anemia due to chronic blood loss 04/30/2018   Respiratory distress    Hematochezia    Goals of care, counseling/discussion    Palliative care by specialist    DNR (do not resuscitate) discussion    Acute renal failure with acute tubular necrosis superimposed on stage 3 chronic kidney disease ([place])    Palliative care encounter    COPD exacerbation ([place]) 04/17/2018   Acute on chronic diastolic CHF (congestive heart failure) ([name])    Severe anemia 02/17/2018   Bradycardia 02/17/2018   Hypothermia    Gastroesophageal reflux disease    Acute encephalopathy 09/25/2017   On home O2 09/25/2017   Bilateral lower extremity edema 06/27/2017   Insomnia 03/28/2017   CKD (chronic kidney disease), stage III ([place]) 03/07/2017   AF (paroxysmal atrial fibrillation) ([name]) 03/05/2017   Chronic diastolic CHF (congestive heart failure) ([place]) 03/04/2017   HCAP (healthcare-associated  pneumonia) 03/04/2017   Sepsis due to pneumonia ([place]) 02/25/2017   Constipation 02/25/2017   Overflow diarrhea/Constipation 02/25/2017   DM type 2 causing vascular disease ([place]) 10/11/2016   Non compliance w medication regimen 12/02/2015   Hypercholesterolemia 05/12/2014   Acute on chronic respiratory failure with hypoxemia ([place]) 11/17/2013   Elevated PSA 01/20/2013   Hypertension    Elevated lipids    Pulmonary fibrosis ([name])    Colon polyps    Colon polyps     Past Medical History:  Diagnosis Date   Allergy    Rhinitis   Atrial fibrillation ([name])    Bronchitis    Chronic respiratory failure ([name])    Colon polyps    COPD (chronic obstructive pulmonary disease) ([place])    Diabetes mellitus    Elevated lipids    Hypercholesterolemia    Hypertension  Iron deficiency anemia due to chronic blood loss 04/30/2018   Noncompliance    On home O2    2L N/C    PSA elevation    Pulmonary fibrosis ([name])    Vitamin D deficiency     Relevant past medical, surgical, family and social history reviewed and updated as indicated. Interim medical history since our last visit reviewed.  Review of Systems Per HPI unless specifically indicated above     Objective:    BP 116/76   Pulse 71   Temp 98.4 F (36.9 C)   Ht 5\' 5"  (1.651 m)   Wt 150 lb (68 kg)   SpO2 95%   BMI 24.96 kg/m   Wt Readings from Last 3 Encounters:  03/30/21 150 lb (68 kg)  03/24/21 153 lb (69.4 kg)  03/18/21 150 lb (68 kg)    Physical Exam Vitals and nursing note reviewed.  Constitutional:      General: He is not in acute distress.    Appearance: Normal appearance. He is not toxic-appearing.  Eyes:     General: No scleral icterus.    Extraocular Movements: Extraocular movements intact.  Cardiovascular:     Rate and Rhythm: Rhythm irregular.  Pulmonary:     Effort: Pulmonary effort is normal. No respiratory distress.     Breath sounds: Wheezing present. No rhonchi or rales.  Musculoskeletal:      Right lower leg: No edema.     Left lower leg: No edema.  Skin:    General: Skin is warm and dry.     Capillary Refill: Capillary refill takes less than 2 seconds.     Coloration: Skin is pale. Skin is not jaundiced.     Findings: No erythema.  Neurological:     Mental Status: He is alert. Mental status is at baseline.     Gait: Gait abnormal (Walks with cane).  Psychiatric:        Mood and Affect: Mood normal.        Thought Content: Thought content normal.      Assessment & Plan:   Problem List Items Addressed This Visit       Cardiovascular and Mediastinum   DM type 2 causing vascular disease ([place]) - Primary    Chronic.  Compliance has been an ongoing struggle for this patient.  It does sound like he may be checking his blood sugars at home, however he did not bring a log with him today.  We will plan to increase Levemir to 37 units nightly given recent elevation in blood sugar over the past few days.  Additionally, last A1c was greater than 10%.  I encouraged the patient to drink water instead of milk or Ensure as these beverages are packed with glucose, however he reports he likes milk and was not willing to cut back on it.  Fortunately, does appear that his mentation has improved from his last visit -he is able to tell me who the president is, his location, and has a general idea of his blood sugars and medications over the past few days.  We will recheck kidney function with electrolytes today and follow-up in 1 week with PCP.       Relevant Orders   BASIC METABOLIC PANEL WITH GFR     Other   Rib pain on right side    Acute.  Discussed at length that rib pain may persist for months after fall.  Encouraged use of lidocaine patches-if these are not helpful,  short course of tramadol given for severe pain only.  Given long history of falls, questionable compliance with medications, risk remains high for chronic use of narcotic medications and this was discussed both with the patient  and his son.        Relevant Medications   Lidocaine (HM LIDOCAINE PATCH) 4 % PTCH   traMADol (ULTRAM) 50 MG tablet     Follow up plan: Return in about 1 week (around 04/06/2021) for blood sugar follow up.

## 2021-03-30 NOTE — Assessment & Plan Note (Signed)
Acute.  Discussed at length that rib pain may persist for months after fall.  Encouraged use of lidocaine patches-if these are not helpful, short course of tramadol given for severe pain only.  Given long history of falls, questionable compliance with medications, risk remains high for chronic use of narcotic medications and this was discussed both with the patient and his son.

## 2021-03-30 NOTE — Assessment & Plan Note (Addendum)
Chronic.  Compliance has been an ongoing struggle for this patient.  It does sound like he may be checking his blood sugars at home, however he did not bring a log with him today.  We will plan to increase Levemir to 37 units nightly given recent elevation in blood sugar over the past few days.  Additionally, last A1c was greater than 10%.  I encouraged the patient to drink water instead of milk or Ensure as these beverages are packed with glucose, however he reports he likes milk and was not willing to cut back on it.  Fortunately, does appear that his mentation has improved from his last visit -he is able to tell me who the president is, his location, and has a general idea of his blood sugars and medications over the past few days.  We will recheck kidney function with electrolytes today and follow-up in 1 week with PCP.

## 2021-03-31 LAB — BASIC METABOLIC PANEL WITH GFR
BUN/Creatinine Ratio: 50 (calc) — ABNORMAL HIGH (ref 6–22)
BUN: 68 mg/dL — ABNORMAL HIGH (ref 7–25)
CO2: 30 mmol/L (ref 20–32)
Calcium: 10.1 mg/dL (ref 8.6–10.3)
Chloride: 96 mmol/L — ABNORMAL LOW (ref 98–110)
Creat: 1.36 mg/dL — ABNORMAL HIGH (ref 0.70–1.11)
GFR, Est African American: 55 mL/min/{1.73_m2} — ABNORMAL LOW (ref >=60)
GFR, Est Non African American: 48 mL/min/{1.73_m2} — ABNORMAL LOW (ref >=60)
Glucose, Bld: 101 mg/dL — ABNORMAL HIGH (ref 65–99)
Potassium: 4.8 mmol/L (ref 3.5–5.3)
Sodium: 135 mmol/L (ref 135–146)

## 2021-03-31 NOTE — Progress Notes (Signed)
Pt does not have vm

## 2021-04-01 ENCOUNTER — Telehealth: Payer: Self-pay

## 2021-04-01 DIAGNOSIS — J449 Chronic obstructive pulmonary disease, unspecified: Secondary | ICD-10-CM | POA: Diagnosis not present

## 2021-04-01 NOTE — Progress Notes (Signed)
Pt has no vm

## 2021-04-05 ENCOUNTER — Other Ambulatory Visit: Payer: Self-pay | Admitting: Family Medicine

## 2021-04-05 DIAGNOSIS — Z09 Encounter for follow-up examination after completed treatment for conditions other than malignant neoplasm: Secondary | ICD-10-CM

## 2021-04-05 DIAGNOSIS — I5032 Chronic diastolic (congestive) heart failure: Secondary | ICD-10-CM

## 2021-04-05 DIAGNOSIS — R600 Localized edema: Secondary | ICD-10-CM

## 2021-04-05 NOTE — Telephone Encounter (Signed)
Closing encounter

## 2021-04-14 ENCOUNTER — Other Ambulatory Visit: Payer: Self-pay

## 2021-04-14 ENCOUNTER — Telehealth: Payer: Self-pay | Admitting: Family Medicine

## 2021-04-14 DIAGNOSIS — R0781 Pleurodynia: Secondary | ICD-10-CM

## 2021-04-14 MED ORDER — TRAMADOL HCL 50 MG PO TABS: 50.0000 mg | ORAL_TABLET | Freq: Two times a day (BID) | ORAL | 0 refills | Status: DC | PRN

## 2021-04-14 NOTE — Telephone Encounter (Signed)
Pt's son called in requesting a refill of traMADol (ULTRAM) 50 MG tablet be sent to the [facility] in [place]. Pt's son also stated that this med is helping pt, but pt is almost out of this med. Please advise.  Cb#: [phone]

## 2021-04-14 NOTE — Telephone Encounter (Signed)
Sent to pcp

## 2021-04-28 ENCOUNTER — Other Ambulatory Visit: Payer: Self-pay

## 2021-04-28 DIAGNOSIS — R0781 Pleurodynia: Secondary | ICD-10-CM

## 2021-04-28 MED ORDER — TRAMADOL HCL 50 MG PO TABS: 50.0000 mg | ORAL_TABLET | Freq: Two times a day (BID) | ORAL | 0 refills | Status: DC | PRN

## 2021-04-28 NOTE — Telephone Encounter (Signed)
Ok to refill??  Last office visit/ refill 04/14/2021, #10 tabs.

## 2021-04-28 NOTE — Telephone Encounter (Signed)
Pt's son called in requesting a refill of traMADol (ULTRAM) 50 MG tablet for pt.  Cb#: [phone]

## 2021-05-02 DIAGNOSIS — J449 Chronic obstructive pulmonary disease, unspecified: Secondary | ICD-10-CM | POA: Diagnosis not present

## 2021-05-11 ENCOUNTER — Telehealth: Payer: Self-pay | Admitting: Family Medicine

## 2021-05-11 NOTE — Telephone Encounter (Signed)
Received call from patient's son Quillian Quince to follow up on Rx refill request from pharmacy for refills of the following medications:   pravastatin (PRAVACHOL) 80 MG tablet cloNIDine (CATAPRES) 0.1 MG tablet metFORMIN (GLUCOPHAGE) 500 MG tablet [id number]      Insulin Glargine (BASAGLAR KWIKPEN) 100 UNIT/ML [id number]       albuterol (PROVENTIL) (2.5 MG/3ML) 0.083% nebulizer solution [id number]   Quillian Quince states pharmacy has faxed several requests for refills. Pharmacy confirmed as   [facility] [place]  [id number] [place] [id number], [place] [address]  Phone:  [phone]  Fax:  [phone]    Patient out of the following meds:  pravastatin (PRAVACHOL) 80 MG tablet cloNIDine (CATAPRES) 0.1 MG tablet metFORMIN (GLUCOPHAGE) 500 MG tablet [id number]   Please advise Quillian Quince at [phone].

## 2021-05-12 MED ORDER — BASAGLAR KWIKPEN 100 UNIT/ML ~~LOC~~ SOPN: 45.0000 [IU] | PEN_INJECTOR | Freq: Every day | SUBCUTANEOUS | 3 refills | Status: DC

## 2021-05-12 MED ORDER — ALBUTEROL SULFATE (2.5 MG/3ML) 0.083% IN NEBU: 2.5000 mg | INHALATION_SOLUTION | Freq: Four times a day (QID) | RESPIRATORY_TRACT | 4 refills | Status: DC | PRN

## 2021-05-12 MED ORDER — METFORMIN HCL 500 MG PO TABS: 500.0000 mg | ORAL_TABLET | Freq: Two times a day (BID) | ORAL | 3 refills | Status: DC

## 2021-05-12 MED ORDER — PRAVASTATIN SODIUM 80 MG PO TABS: 80.0000 mg | ORAL_TABLET | Freq: Every day | ORAL | 3 refills | Status: DC

## 2021-05-12 MED ORDER — CLONIDINE HCL 0.1 MG PO TABS: 0.1000 mg | ORAL_TABLET | Freq: Two times a day (BID) | ORAL | 3 refills | Status: DC

## 2021-05-12 NOTE — Telephone Encounter (Signed)
Prescription sent to pharmacy.   Of note, no refill requests have been received.

## 2021-05-19 ENCOUNTER — Other Ambulatory Visit: Payer: Self-pay | Admitting: *Deleted

## 2021-05-19 ENCOUNTER — Other Ambulatory Visit: Payer: Self-pay

## 2021-05-19 ENCOUNTER — Telehealth: Payer: Self-pay | Admitting: *Deleted

## 2021-05-19 DIAGNOSIS — R0781 Pleurodynia: Secondary | ICD-10-CM

## 2021-05-19 MED ORDER — LANTUS SOLOSTAR 100 UNIT/ML ~~LOC~~ SOPN: 45.0000 [IU] | PEN_INJECTOR | Freq: Every day | SUBCUTANEOUS | 11 refills | Status: DC

## 2021-05-19 MED ORDER — TRAMADOL HCL 50 MG PO TABS: 50.0000 mg | ORAL_TABLET | Freq: Two times a day (BID) | ORAL | 0 refills | Status: DC | PRN

## 2021-05-19 NOTE — Telephone Encounter (Signed)
Received request from pharmacy for [place] on [address].  The following alternative(s) is/are the preferred alternative(s): Lantus Solostar or Toujeo Max Engineer, civil (consulting) or [employer], Levemir Flextouch, Tyler Aas Flextouch.  Medication changed to Lantus.

## 2021-05-19 NOTE — Telephone Encounter (Signed)
Pt's son called in requesting a refill of traMADol (ULTRAM) 50 MG tablet, sent to pharmacy. Please advise.  Cb#: [phone]

## 2021-05-19 NOTE — Telephone Encounter (Signed)
Ok to refill??  Last office visit 03/30/2021.  Last refill 04/28/2021, #10 tabs.

## 2021-05-26 ENCOUNTER — Other Ambulatory Visit: Payer: Self-pay | Admitting: Family Medicine

## 2021-05-26 DIAGNOSIS — R0781 Pleurodynia: Secondary | ICD-10-CM

## 2021-05-26 NOTE — Telephone Encounter (Signed)
Patient's son [name] called to request refill of traMADol (ULTRAM) 50 MG tablet [id number]  Requesting refill for more than 10 pills; currently taking 2 per day and only has about 2 pills left.  Pharmacy confirmed as  [facility] [address], [place] [address]  [address] [place], [place] [address]  Phone:  [phone]  Fax:  [phone]    Please advise at [phone]

## 2021-05-27 MED ORDER — TRAMADOL HCL 50 MG PO TABS: 50.0000 mg | ORAL_TABLET | Freq: Two times a day (BID) | ORAL | 0 refills | Status: DC | PRN

## 2021-05-27 NOTE — Telephone Encounter (Signed)
Ok to refill??  Last office visit 03/24/2021.  Last refill 05/19/2021, #10 tabs.   Please clarify if quantity can be increased.

## 2021-06-02 ENCOUNTER — Other Ambulatory Visit: Payer: Self-pay | Admitting: Family Medicine

## 2021-06-02 DIAGNOSIS — I10 Essential (primary) hypertension: Secondary | ICD-10-CM

## 2021-06-02 DIAGNOSIS — J449 Chronic obstructive pulmonary disease, unspecified: Secondary | ICD-10-CM | POA: Diagnosis not present

## 2021-06-06 ENCOUNTER — Other Ambulatory Visit: Payer: Self-pay | Admitting: Family Medicine

## 2021-06-07 ENCOUNTER — Telehealth: Payer: Self-pay

## 2021-06-07 NOTE — Telephone Encounter (Signed)
Pt's son called in requesting a refill of pregabalin (LYRICA) 100 MG capsule.   Cb#: [phone]

## 2021-06-07 NOTE — Telephone Encounter (Signed)
Ok to refill??  Last office visit 03/24/2021.  Last refill 02/04/2021, #3 refills.

## 2021-06-07 NOTE — Telephone Encounter (Signed)
Duplicate request

## 2021-06-17 ENCOUNTER — Ambulatory Visit (INDEPENDENT_AMBULATORY_CARE_PROVIDER_SITE_OTHER): Payer: Medicare Other | Admitting: Family Medicine

## 2021-06-17 ENCOUNTER — Other Ambulatory Visit: Payer: Self-pay

## 2021-06-17 VITALS — BP 148/68 | HR 88 | Temp 98.1°F | Resp 18 | Ht 65.0 in | Wt 159.0 lb

## 2021-06-17 DIAGNOSIS — E1165 Type 2 diabetes mellitus with hyperglycemia: Secondary | ICD-10-CM

## 2021-06-17 DIAGNOSIS — R410 Disorientation, unspecified: Secondary | ICD-10-CM | POA: Diagnosis not present

## 2021-06-17 DIAGNOSIS — Z9114 Patient's other noncompliance with medication regimen: Secondary | ICD-10-CM

## 2021-06-17 DIAGNOSIS — J441 Chronic obstructive pulmonary disease with (acute) exacerbation: Secondary | ICD-10-CM

## 2021-06-17 DIAGNOSIS — L03113 Cellulitis of right upper limb: Secondary | ICD-10-CM | POA: Diagnosis not present

## 2021-06-17 MED ORDER — IPRATROPIUM-ALBUTEROL 0.5-2.5 (3) MG/3ML IN SOLN
3.0000 mL | Freq: Once | RESPIRATORY_TRACT | Status: AC
  Administered 2021-06-17: 3 mL via RESPIRATORY_TRACT

## 2021-06-17 MED ORDER — CEPHALEXIN 500 MG PO CAPS: 500.0000 mg | ORAL_CAPSULE | Freq: Three times a day (TID) | ORAL | 0 refills | Status: DC

## 2021-06-17 MED ORDER — METHYLPREDNISOLONE ACETATE 40 MG/ML IJ SUSP
60.0000 mg | Freq: Once | INTRAMUSCULAR | Status: AC
  Administered 2021-06-17: 60 mg via INTRAMUSCULAR

## 2021-06-17 MED ORDER — BUDESONIDE-FORMOTEROL FUMARATE 160-4.5 MCG/ACT IN AERO: 2.0000 | INHALATION_SPRAY | Freq: Two times a day (BID) | RESPIRATORY_TRACT | 11 refills | Status: DC

## 2021-06-17 MED ORDER — TRAMADOL HCL 50 MG PO TABS: 50.0000 mg | ORAL_TABLET | Freq: Two times a day (BID) | ORAL | 0 refills | Status: DC | PRN

## 2021-06-17 MED ORDER — PREDNISONE 20 MG PO TABS: ORAL_TABLET | ORAL | 0 refills | Status: DC

## 2021-06-17 NOTE — Progress Notes (Signed)
Subjective:    Patient ID: Edward [name], male    DOB: 05-31-1935, 86 y.o.   MRN: [id number]  HPI Patient has a history of noncompliance.  He has COPD and is oxygen dependent.  He is usually on 2 to 3 L at home.  He is supposed to be on Symbicort daily as a maintenance medication.  He presents today complaining of difficulty breathing.  Pulse oximetry was 85% on room air when he arrived.  He did not bring his oxygen with him.  Apparently he ran out of Symbicort over a week ago.  In the interval time he has been using albuterol as needed.  Today he is wheezing profusely in all 4 lung fields.  There is no increased work of breathing but he is hypoxic on room air.  He also has an elevated respiratory rate.  Son states that he is been increasingly confused which I suspect is due to hypercarbia and hypoxia.  He also has an area of cellulitis on the dorsum of his right wrist.  The area of cellulitis is about 6 cm in diameter and extends circumferentially around what appears to be a papule in the center that is roughly 1 cm in diameter and appears to be an infected insect bite.  Patient states that he had been scratching that recently because it was itching  Past Medical History:  Diagnosis Date   Allergy    Rhinitis   Atrial fibrillation ([name])    Bronchitis    Chronic respiratory failure ([name])    Colon polyps    COPD (chronic obstructive pulmonary disease) ([name])    Diabetes mellitus    Elevated lipids    Hypercholesterolemia    Hypertension    Iron deficiency anemia due to chronic blood loss 04/30/2018   Noncompliance    On home O2    2L N/C    PSA elevation    Pulmonary fibrosis ([name])    Vitamin D deficiency    Past Surgical History:  Procedure Laterality Date   BIOPSY  04/23/2018   Procedure: BIOPSY;  Surgeon: Danie Binder, MD;  Location: [facility];  Service: Endoscopy;;  duodenum gastric   CATARACT EXTRACTION W/PHACO  06/25/2012   Procedure: CATARACT EXTRACTION PHACO AND  INTRAOCULAR LENS PLACEMENT ([place]);  Surgeon: Elta Guadeloupe T. Gershon Crane, MD;  Location: [facility];  Service: Ophthalmology;  Laterality: Left;  CDE=19.01   CATARACT EXTRACTION W/PHACO  07/09/2012   Procedure: CATARACT EXTRACTION PHACO AND INTRAOCULAR LENS PLACEMENT ([facility]);  Surgeon: Elta Guadeloupe T. Gershon Crane, MD;  Location: [facility];  Service: Ophthalmology;  Laterality: Right;  CDE: 20.09   COLONOSCOPY WITH PROPOFOL N/A 04/23/2018   Procedure: COLONOSCOPY WITH PROPOFOL;  Surgeon: Danie Binder, MD;  Location: [facility];  Service: Endoscopy;  Laterality: N/A;   ESOPHAGOGASTRODUODENOSCOPY (EGD) WITH PROPOFOL N/A 04/23/2018   Procedure: ESOPHAGOGASTRODUODENOSCOPY (EGD) WITH PROPOFOL;  Surgeon: Danie Binder, MD;  Location: [facility];  Service: Endoscopy;  Laterality: N/A;   Current Outpatient Medications on File Prior to Visit  Medication Sig Dispense Refill   pregabalin (LYRICA) 100 MG capsule TAKE 1 CAPSULE BY MOUTH TWICE DAILY (STOP  GABAPENTIN) 60 capsule 0   acetaminophen (TYLENOL) 500 MG tablet Take 500 mg by mouth every 6 (six) hours as needed for headache.     albuterol (PROVENTIL) (2.5 MG/3ML) 0.083% nebulizer solution Take 3 mLs (2.5 mg total) by nebulization every 6 (six) hours as needed for wheezing or shortness of breath. 360 mL 4  budesonide-formoterol (SYMBICORT) 160-4.5 MCG/ACT inhaler Inhale 2 puffs into the lungs 2 (two) times daily. 1 Inhaler 3   cloNIDine (CATAPRES) 0.1 MG tablet Take 1 tablet (0.1 mg total) by mouth 2 (two) times daily. 60 tablet 3   ELIQUIS 5 MG TABS tablet Take 1 tablet by mouth twice daily 60 tablet 11   furosemide (LASIX) 40 MG tablet TAKE 1 TABLET BY MOUTH DAILY 30 tablet 3   gabapentin (NEURONTIN) 100 MG capsule TAKE 1 CAPSULE BY MOUTH THREE TIMES DAILY 90 capsule 0   insulin glargine (LANTUS SOLOSTAR) 100 UNIT/ML Solostar Pen Inject 45 Units into the skin at bedtime. 15 mL 11   ipratropium-albuterol (DUONEB) 0.5-2.5 (3) MG/3ML SOLN Take 3 mLs by nebulization every 6 (six)  hours as needed. 360 mL 1   Lancets (ONETOUCH DELICA PLUS [id number]) MISC USE TO CHECK BLOOD SUGAR TWICE DAILY AS DIRECTED 100 each 0   Lidocaine (HM LIDOCAINE PATCH) 4 % PTCH Apply 1 patch topically daily. 1 patch 3   linezolid (ZYVOX) 600 MG tablet Take 1 tablet (600 mg total) by mouth 2 (two) times daily. 60 tablet 0   losartan (COZAAR) 25 MG tablet Take 1 tablet (25 mg total) by mouth daily. 90 tablet 3   metFORMIN (GLUCOPHAGE) 500 MG tablet Take 1 tablet (500 mg total) by mouth 2 (two) times daily with a meal. 60 tablet 3   metoprolol tartrate (LOPRESSOR) 25 MG tablet TAKE 1 TABLET BY MOUTH TWICE DAILY 60 tablet 3   ONETOUCH ULTRA test strip CHECK FASTING BLOOD SUGAR TWICE DAILY 50 strip 3   OXYGEN Inhale 3 L into the lungs continuous.      potassium chloride SA (KLOR-CON) 20 MEQ tablet Take 1 tablet by mouth once daily 90 tablet 0   pravastatin (PRAVACHOL) 80 MG tablet Take 1 tablet (80 mg total) by mouth at bedtime. 30 tablet 3   tamsulosin (FLOMAX) 0.4 MG CAPS capsule Take 1 capsule (0.4 mg total) by mouth daily. 30 capsule 3   traMADol (ULTRAM) 50 MG tablet Take 1 tablet (50 mg total) by mouth every 12 (twelve) hours as needed. 10 tablet 0   No current facility-administered medications on file prior to visit.   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  Tobacco Use   Smoking status: Former    Packs/day: 1.50    Years: 60.00    Pack years: 90.00    Types: Cigarettes    Quit date: 12/31/2012    Years since quitting: 8.4   Smokeless tobacco: Never  Vaping Use   Vaping Use: Never used   Substance and Sexual Activity   Alcohol use: No   Drug use: No   Sexual activity: Yes    Birth control/protection: None  Other Topics Concern   Not on file  Social History Narrative   Not on file   Social Determinants of Health   Financial Resource Strain: Not on file  Food Insecurity: Not on file  Transportation Needs: Not on file  Physical Activity: Not on file  Stress: Not on file  Social  Connections: Not on file  Intimate Partner Violence: Not on file     Review of Systems     Objective:   Physical Exam Vitals reviewed.  Constitutional:      Appearance: He is obese. He is not ill-appearing (Patient is wearing his pants backward.  Smells of urine.) or toxic-appearing.  HENT:     Mouth/Throat:     Mouth: Mucous membranes are dry.  Cardiovascular:     Rate and Rhythm: Normal rate. Rhythm irregular.     Heart sounds: Normal heart sounds.  Pulmonary:     Effort: Pulmonary effort is normal. No respiratory distress.     Breath sounds: Decreased air movement present. No stridor. Examination of the right-upper field reveals wheezing. Examination of the left-upper field reveals wheezing. Examination of the right-middle field reveals wheezing. Examination of the left-middle field reveals wheezing. Examination of the right-lower field reveals wheezing. Examination of the left-lower field reveals wheezing. Decreased breath sounds and wheezing present. No rhonchi.  Abdominal:     General: Abdomen is flat. There is no distension.     Palpations: Abdomen is soft.     Tenderness: There is no abdominal tenderness. There is no guarding.  Musculoskeletal:       Arms:     Cervical back: Neck supple.     Right lower leg: No edema.     Left lower leg: No edema.  Lymphadenopathy:     Cervical: No cervical adenopathy.  Neurological:     Mental Status: He is alert.          Assessment & Plan:  Confusion  COPD with acute exacerbation ([place]) - Plan: traMADol (ULTRAM) 50 MG  tablet  Uncontrolled type 2 diabetes mellitus with hyperglycemia ([name])  Non compliance w medication regimen  Right arm cellulitis Patient is having a COPD exacerbation secondary to discontinuation of Symbicort and noncompliance with therapy.  Patient was given a DuoNeb containing 3 mg of albuterol and 0.5 mg of Atrovent upon arrival.  He was also started on oxygen 2 L.  Breathing improved after this.  Was given Depo-Medrol 60 mg IM x1 now.  Recommended that he resume Symbicort 2 puffs inhaled twice daily immediately and I sent a prescription to his pharmacy.  Also started him on prednisone taper pack.  Encouraged him to use albuterol 2 puffs every 6 hours until breathing has improved.  Seek medical attention over the weekend if worsening.  Treat the cellulitis on his dorsal right forearm with Keflex 500 mg p.o. 3 times daily x7 days.  Recheck here on Monday or go to the emergency room over the weekend if worsening

## 2021-06-21 ENCOUNTER — Ambulatory Visit (INDEPENDENT_AMBULATORY_CARE_PROVIDER_SITE_OTHER): Payer: Medicare Other | Admitting: Family Medicine

## 2021-06-21 ENCOUNTER — Other Ambulatory Visit: Payer: Self-pay

## 2021-06-21 ENCOUNTER — Encounter: Payer: Self-pay | Admitting: Family Medicine

## 2021-06-21 VITALS — BP 128/80 | HR 90 | Ht 65.0 in | Wt 157.8 lb

## 2021-06-21 DIAGNOSIS — Z9114 Patient's other noncompliance with medication regimen: Secondary | ICD-10-CM | POA: Diagnosis not present

## 2021-06-21 DIAGNOSIS — B0229 Other postherpetic nervous system involvement: Secondary | ICD-10-CM | POA: Diagnosis not present

## 2021-06-21 DIAGNOSIS — L03113 Cellulitis of right upper limb: Secondary | ICD-10-CM | POA: Diagnosis not present

## 2021-06-21 DIAGNOSIS — J441 Chronic obstructive pulmonary disease with (acute) exacerbation: Secondary | ICD-10-CM

## 2021-06-21 DIAGNOSIS — E1165 Type 2 diabetes mellitus with hyperglycemia: Secondary | ICD-10-CM | POA: Diagnosis not present

## 2021-06-21 MED ORDER — TRAMADOL HCL 50 MG PO TABS: 50.0000 mg | ORAL_TABLET | Freq: Two times a day (BID) | ORAL | 0 refills | Status: DC | PRN

## 2021-06-21 NOTE — Progress Notes (Signed)
Wt Readings from Last 3 Encounters:  06/21/21 157 lb 12.8 oz (71.6 kg)  06/17/21 159 lb (72.1 kg)  03/30/21 150 lb (68 kg)     Subjective:    Patient ID: Edward [name], male    DOB: November 29, 1934, 86 y.o.   MRN: [id number]  HPI 06/17/21 Patient has a history of noncompliance.  He has COPD and is oxygen dependent.  He is usually on 2 to 3 L at home.  He is supposed to be on Symbicort daily as a maintenance medication.  He presents today complaining of difficulty breathing.  Pulse oximetry was 85% on room air when he arrived.  He did not bring his oxygen with him.  Apparently he ran out of Symbicort over a week ago.  In the interval time he has been using albuterol as needed.  Today he is wheezing profusely in all 4 lung fields.  There is no increased work of breathing but he is hypoxic on room air.  He also has an elevated respiratory rate.  Son states that he is been increasingly confused which I suspect is due to hypercarbia and hypoxia.  He also has an area of cellulitis on the dorsum of his right wrist.  The area of cellulitis is about 6 cm in diameter and extends circumferentially around what appears to be a papule in the center that is roughly 1 cm in diameter and appears to be an infected insect bite.  Patient states that he had been scratching that recently because it was itching.  At that time, my plan was: Patient is having a COPD exacerbation secondary to discontinuation of Symbicort and noncompliance with therapy.  Patient was given a DuoNeb containing 3 mg of albuterol and 0.5 mg of Atrovent upon arrival.  He was also started on oxygen 2 L.  Breathing improved after this.  Was given Depo-Medrol 60 mg IM x1 now.  Recommended that he resume Symbicort 2 puffs inhaled twice daily immediately and I sent a prescription to his pharmacy.  Also started him on prednisone taper pack.  Encouraged him to use albuterol 2 puffs every 6 hours until breathing has improved.  Seek medical attention over the  weekend if worsening.  Treat the cellulitis on his dorsal right forearm with Keflex 500 mg p.o. 3 times daily x7 days.  Recheck here on Monday or go to the emergency room over the weekend if worsening  06/21/21 Patient is here today for follow-up.  Problem #1, the erythema on the dorsum of the right wrist has faded dramatically since I last saw him.  There is still some mild erythema around a scab-like papule that is approximately 1.5 cm in the center of the sick centimeter patch.  However it is at least 80% better.  The cellulitis seems to be responding to the Keflex well.  Problem #2, the COPD is much better today.  He is no longer wheezing.  Pulse oximetry is up to 92% on room air.  Just a few days ago it was 85% and required 2 to 3 L of oxygen to get it back above 90%.  Strongly encourage the patient to be compliant with his Symbicort.  Noncompliance with medical regimen is the biggest challenge for this patient.  Problem #3, the diabetes.  He is currently on 45 units of Basaglar daily.  His son administers the shot.  I ask how his sugars are running and the son is unable to give me a consensus.  The patient states over  200.  The stone states 1 30-1 50 and sometimes over 200.  I recommended that we need to uptitrate his basal insulin to try to achieve fasting blood sugars that are more consistent under 130.  #4 the patient reports chronic pain in his right arm.  He shingles with postherpetic neuralgia in the right arm.  He takes tramadol occasionally for pain in the right arm.  He has been getting 10 tablets a month that run out quickly.  He is asking if I will increase the total amount.  He wants to take them twice a day.  Past Medical History:  Diagnosis Date   Allergy    Rhinitis   Atrial fibrillation ([name])    Bronchitis    Chronic respiratory failure ([name])    Colon polyps    COPD (chronic obstructive pulmonary disease) ([place])    Diabetes mellitus    Elevated lipids    Hypercholesterolemia     Hypertension    Iron deficiency anemia due to chronic blood loss 04/30/2018   Noncompliance    On home O2    2L N/C    PSA elevation    Pulmonary fibrosis ([name])    Vitamin D deficiency    Past Surgical History:  Procedure Laterality Date   BIOPSY  04/23/2018   Procedure: BIOPSY;  Surgeon: Danie Binder, MD;  [facility];  Service: Endoscopy;;  duodenum gastric   CATARACT EXTRACTION W/PHACO  06/25/2012   Procedure: CATARACT EXTRACTION PHACO AND INTRAOCULAR LENS PLACEMENT ([place]);  Surgeon: Elta Guadeloupe T. Gershon Crane, MD;  Location: [facility];  Service: Ophthalmology;  Laterality: Left;  CDE=19.01   CATARACT EXTRACTION W/PHACO  07/09/2012   Procedure: CATARACT EXTRACTION PHACO AND INTRAOCULAR LENS PLACEMENT ([facility]);  Surgeon: Elta Guadeloupe T. Gershon Crane, MD;  Location: [facility];  Service: Ophthalmology;  Laterality: Right;  CDE: 20.09   COLONOSCOPY WITH PROPOFOL N/A 04/23/2018   Procedure: COLONOSCOPY WITH PROPOFOL;  Surgeon: Danie Binder, MD;  Location: [facility];  Service: Endoscopy;  Laterality: N/A;   ESOPHAGOGASTRODUODENOSCOPY (EGD) WITH PROPOFOL N/A 04/23/2018   Procedure: ESOPHAGOGASTRODUODENOSCOPY (EGD) WITH PROPOFOL;  Surgeon: Danie Binder, MD;  Location: [facility];  Service: Endoscopy;  Laterality: N/A;   Current Outpatient Medications on File Prior to Visit  Medication Sig Dispense Refill   acetaminophen (TYLENOL) 500 MG tablet Take 500 mg by mouth every 6 (six) hours as needed for headache.     albuterol (PROVENTIL) (2.5 MG/3ML) 0.083% nebulizer solution Take 3 mLs (2.5 mg total) by nebulization every 6 (six) hours as needed for wheezing or shortness of breath. 360 mL 4   budesonide-formoterol (SYMBICORT) 160-4.5 MCG/ACT inhaler Inhale 2 puffs into the lungs 2 (two) times daily. 1 each 11   cephALEXin (KEFLEX) 500 MG capsule Take 1 capsule (500 mg total) by mouth 3 (three) times daily. 21 capsule 0   cloNIDine (CATAPRES) 0.1 MG tablet Take 1 tablet (0.1 mg total) by mouth 2 (two) times  daily. 60 tablet 3   ELIQUIS 5 MG TABS tablet Take 1 tablet by mouth twice daily 60 tablet 11   furosemide (LASIX) 40 MG tablet TAKE 1 TABLET BY MOUTH DAILY 30 tablet 3   gabapentin (NEURONTIN) 100 MG capsule TAKE 1 CAPSULE BY MOUTH THREE TIMES DAILY 90 capsule 0   insulin glargine (LANTUS SOLOSTAR) 100 UNIT/ML Solostar Pen Inject 45 Units into the skin at bedtime. 15 mL 11   ipratropium-albuterol (DUONEB) 0.5-2.5 (3) MG/3ML SOLN Take 3 mLs by nebulization every 6 (six)  hours as needed. 360 mL 1   Lancets (ONETOUCH DELICA PLUS [id number]) MISC USE TO CHECK BLOOD SUGAR TWICE DAILY AS DIRECTED 100 each 0   Lidocaine (HM LIDOCAINE PATCH) 4 % PTCH Apply 1 patch topically daily. 1 patch 3   linezolid (ZYVOX) 600 MG tablet Take 1 tablet (600 mg total) by mouth 2 (two) times daily. 60 tablet 0   losartan (COZAAR) 25 MG tablet Take 1 tablet (25 mg total) by mouth daily. 90 tablet 3   metFORMIN (GLUCOPHAGE) 500 MG tablet Take 1 tablet (500 mg total) by mouth 2 (two) times daily with a meal. 60 tablet 3   metoprolol tartrate (LOPRESSOR) 25 MG tablet TAKE 1 TABLET BY MOUTH TWICE DAILY 60 tablet 3   ONETOUCH ULTRA test strip CHECK FASTING BLOOD SUGAR TWICE DAILY 50 strip 3   OXYGEN Inhale 3 L into the lungs continuous.      potassium chloride SA (KLOR-CON) 20 MEQ tablet Take 1 tablet by mouth once daily 90 tablet 0   pravastatin (PRAVACHOL) 80 MG tablet Take 1 tablet (80 mg total) by mouth at bedtime. 30 tablet 3   predniSONE (DELTASONE) 20 MG tablet 3 tabs poqday 1-2, 2 tabs poqday 3-4, 1 tab poqday 5-6 12 tablet 0   pregabalin (LYRICA) 100 MG capsule TAKE 1 CAPSULE BY MOUTH TWICE DAILY (STOP  GABAPENTIN) 60 capsule 0   tamsulosin (FLOMAX) 0.4 MG CAPS capsule Take 1 capsule (0.4 mg total) by mouth daily. 30 capsule 3   No current facility-administered medications on file prior to visit.   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  Tobacco Use   Smoking status: Former    Packs/day: 1.50    Years: 60.00    Pack years: 90.00    Types: Cigarettes    Quit date: 12/31/2012    Years since quitting: 8.4   Smokeless tobacco: Never  Vaping Use   Vaping Use: Never used  Substance and Sexual Activity   Alcohol use: No   Drug use: No   Sexual activity: Yes    Birth control/protection: None  Other Topics Concern   Not on file  Social History Narrative   Not on file   Social Determinants of Health   Financial Resource Strain: Not on file  Food Insecurity: Not on file  Transportation Needs: Not on file  Physical Activity: Not on file  Stress: Not on file  Social Connections: Not on file  Intimate Partner Violence: Not on file     Review of Systems     Objective:   Physical Exam Vitals reviewed.  Constitutional:      Appearance: He is obese. He is not ill-appearing (Patient is wearing his pants backward.  Smells of urine.) or toxic-appearing.  HENT:     Mouth/Throat:     Mouth: Mucous membranes are dry.  Cardiovascular:     Rate and Rhythm: Normal rate. Rhythm irregular.     Heart sounds: Normal heart sounds.  Pulmonary:     Effort: Pulmonary effort is normal. No respiratory distress.  Breath sounds: Decreased air movement present. No stridor. Decreased breath sounds present. No wheezing or rhonchi.  Abdominal:     General: Abdomen is flat. There is no distension.     Palpations: Abdomen is soft.     Tenderness: There is no abdominal tenderness. There is no guarding.  Musculoskeletal:       Arms:     Cervical back: Neck supple.     Right lower leg: No edema.     Left lower leg: No  edema.  Lymphadenopathy:     Cervical: No cervical adenopathy.  Neurological:     Mental Status: He is alert.          Assessment & Plan:  Uncontrolled type 2 diabetes mellitus with hyperglycemia ([name])  COPD with acute exacerbation ([place]) - Plan: traMADol (ULTRAM) 50 MG tablet  Non compliance w medication regimen  Right arm cellulitis  Postherpetic neuralgia Erythema has faded dramatically.  Cellulitis seems to be resolving well that coupled with the improvement in his hypoxemia has caused the patient to return back to his baseline level of mental acuity.  Complete Keflex.  I recommended a shave biopsy of the scab-like papule in the center of the dorsum of the right forearm where the erythema is located if the lesion persists more than 2 weeks after resolution of the cellulitis.  COPD is back to his baseline.  Complete the prednisone and be consistent in taking Symbicort 2 puffs twice daily.  Increase insulin to 50 units a day and report fasting blood sugars to me in 1 week.  Uptitrate insulin to achieve fasting blood sugars under 130 if possible.  Encourage compliance.  Increase tramadol to 30 tablets/month.  This would allow him 1 to 2 tablets a day on 50% of the days.  I believe that this is reasonable.

## 2021-06-23 ENCOUNTER — Telehealth: Payer: Self-pay | Admitting: Pharmacist

## 2021-06-23 NOTE — Progress Notes (Signed)
Chronic Care Management Pharmacy Assistant   Name: Edward [name]  MRN: [id number] DOB: [birth year]  Reason for Encounter: Disease State For DM and HTN.   Conditions to be addressed/monitored: Type II DM, Hypertension, hyperlipidemia, COPD, CKD stage III, AFIB, CHF.  Recent office visits:  06/21/21 Dr. Dennard Schaumann For follow-up. No medication changes.  06/17/21 Dr. Dennard Schaumann For wound infection. STARTED Cephalexin 500 mg 3 time daily and Prednisone 20 mg pack. 03/30/21 Eulogio Bear, NP. For diabetes. STARTED Lidocaine 4% 1 patch apply externally daily. CHANGED Tramadol to 50 mg every 12 hours PRN.   Recent consult visits:  None since 03/25/21  Hospital visits:  None since 03/25/21  Medications: Outpatient Encounter Medications as of 06/23/2021  Medication Sig   acetaminophen (TYLENOL) 500 MG tablet Take 500 mg by mouth every 6 (six) hours as needed for headache.   albuterol (PROVENTIL) (2.5 MG/3ML) 0.083% nebulizer solution Take 3 mLs (2.5 mg total) by nebulization every 6 (six) hours as needed for wheezing or shortness of breath.   budesonide-formoterol (SYMBICORT) 160-4.5 MCG/ACT inhaler Inhale 2 puffs into the lungs 2 (two) times daily.   cephALEXin (KEFLEX) 500 MG capsule Take 1 capsule (500 mg total) by mouth 3 (three) times daily.   cloNIDine (CATAPRES) 0.1 MG tablet Take 1 tablet (0.1 mg total) by mouth 2 (two) times daily.   ELIQUIS 5 MG TABS tablet Take 1 tablet by mouth twice daily   furosemide (LASIX) 40 MG tablet TAKE 1 TABLET BY MOUTH DAILY   gabapentin (NEURONTIN) 100 MG capsule TAKE 1 CAPSULE BY MOUTH THREE TIMES DAILY   insulin glargine (LANTUS SOLOSTAR) 100 UNIT/ML Solostar Pen Inject 45 Units into the skin at bedtime.   ipratropium-albuterol (DUONEB) 0.5-2.5 (3) MG/3ML SOLN Take 3 mLs by nebulization every 6 (six) hours as needed.   Lancets (ONETOUCH DELICA PLUS [id number]) MISC USE TO CHECK BLOOD SUGAR TWICE DAILY AS DIRECTED   Lidocaine (HM LIDOCAINE PATCH) 4 %  PTCH Apply 1 patch topically daily.   linezolid (ZYVOX) 600 MG tablet Take 1 tablet (600 mg total) by mouth 2 (two) times daily.   losartan (COZAAR) 25 MG tablet Take 1 tablet (25 mg total) by mouth daily.   metFORMIN (GLUCOPHAGE) 500 MG tablet Take 1 tablet (500 mg total) by mouth 2 (two) times daily with a meal.   metoprolol tartrate (LOPRESSOR) 25 MG tablet TAKE 1 TABLET BY MOUTH TWICE DAILY   ONETOUCH ULTRA test strip CHECK FASTING BLOOD SUGAR TWICE DAILY   OXYGEN Inhale 3 L into the lungs continuous.    potassium chloride SA (KLOR-CON) 20 MEQ tablet Take 1 tablet by mouth once daily   pravastatin (PRAVACHOL) 80 MG tablet Take 1 tablet (80 mg total) by mouth at bedtime.   predniSONE (DELTASONE) 20 MG tablet 3 tabs poqday 1-2, 2 tabs poqday 3-4, 1 tab poqday 5-6   pregabalin (LYRICA) 100 MG capsule TAKE 1 CAPSULE BY MOUTH TWICE DAILY (STOP  GABAPENTIN)   tamsulosin (FLOMAX) 0.4 MG CAPS capsule Take 1 capsule (0.4 mg total) by mouth daily.   traMADol (ULTRAM) 50 MG tablet Take 1 tablet (50 mg total) by mouth every 12 (twelve) hours as needed.   No facility-administered encounter medications on file as of 06/23/2021.   Reviewed chart prior to disease state call. Spoke with patient regarding BP  Recent Office Vitals: BP Readings from Last 3 Encounters:  06/21/21 128/80  06/17/21 (!) 148/68  03/30/21 116/76   Pulse Readings from Last 3 Encounters:  06/21/21  90  06/17/21 88  03/30/21 71    Wt Readings from Last 3 Encounters:  06/21/21 157 lb 12.8 oz (71.6 kg)  06/17/21 159 lb (72.1 kg)  03/30/21 150 lb (68 kg)     Kidney Function Lab Results  Component Value Date/Time   CREATININE 1.36 (H) 03/30/2021 11:13 AM   CREATININE 1.67 (H) 03/24/2021 12:33 PM   GFRNONAA 48 (L) 03/30/2021 11:13 AM   GFRAA 55 (L) 03/30/2021 11:13 AM    BMP Latest Ref Rng & Units 03/30/2021 03/24/2021 03/18/2021  Glucose 65 - 99 mg/dL 101(H) 202(H) 448(H)  BUN 7 - 25 mg/dL 68(H) 76(H) 78(H)  Creatinine  0.70 - 1.11 mg/dL 1.36(H) 1.67(H) 1.71(H)  BUN/Creat Ratio 6 - 22 (calc) 50(H) 46(H) -  Sodium 135 - 146 mmol/L 135 137 130(L)  Potassium 3.5 - 5.3 mmol/L 4.8 4.5 4.7  Chloride 98 - 110 mmol/L 96(L) 99 93(L)  CO2 20 - 32 mmol/L 30 26 28   Calcium 8.6 - 10.3 mg/dL 10.1 10.5(H) 9.2    Current antihypertensive regimen:  Losartan 25 mg 1 tablet daily  Clonidine 0.1 mg 1 tablet 2 times daily  What recent interventions/DTPs have been made by any provider to improve Blood Pressure control since last CPP Visit: None.  Any recent hospitalizations or ED visits since last visit with CPP? Patients chart does not show any hospitalizations or ER visit  Adherence Review: Is the patient currently on ACE/ARB medication? Losartan 25 mg  Does the patient have >5 day gap between last estimated fill dates? Per misc rpts, yes.  Recent Relevant Labs: Lab Results  Component Value Date/Time   HGBA1C 12.6 (H) 03/24/2021 12:33 PM   HGBA1C 13.4 (H) 12/24/2020 08:29 AM   HGBA1C >14.0 (H) 12/01/2015 08:31 AM   HGBA1C >14.0 (H) 05/27/2015 08:54 AM   MICROALBUR CANCELED 12/24/2020 08:29 AM   MICROALBUR 378.4 01/23/2020 03:09 PM    Kidney Function Lab Results  Component Value Date/Time   CREATININE 1.36 (H) 03/30/2021 11:13 AM   CREATININE 1.67 (H) 03/24/2021 12:33 PM   GFRNONAA 48 (L) 03/30/2021 11:13 AM   GFRAA 55 (L) 03/30/2021 11:13 AM    Current antihyperglycemic regimen:  Metformin 500 mg 1 tablet twice daily. Lantus Solostar 45 units into the skin at bedtime.  What recent interventions/DTPs have been made to improve glycemic control:  None.   Have there been any recent hospitalizations or ED visits since last visit with CPP? Patients chart does not show any hospitalizations or ER visit  Adherence Review: Is the patient currently on a STATIN medication? Pravastatin 80 mg  Is the patient currently on ACE/ARB medication? Losartan 25 mg  Does the patient have >5 day gap between last estimated  fill dates? Per misc rpts, yes.  Care Gaps:Patient is due for eye and foot exam. Patient is due for his AWV.  Star Rating Drugs:Pravastatin 80 mg 06/14/21 30 DS, metformin 500 mg 06/14/21 30 DS, Losartan 25 mg 01/28/21 90 DS.   Third unsuccessful telephone outreach was attempted today. The patient was referred to the pharmacist for assistance with care management and care coordination.   Follow-Up:Pharmacist Review  Charlann Lange, [place] Pharmacist Assistant [phone]

## 2021-06-27 ENCOUNTER — Emergency Department (HOSPITAL_COMMUNITY)
Admission: EM | Admit: 2021-06-27 | Discharge: 2021-06-27 | Disposition: A | Discharge status: Home / Self Care | Payer: Medicare Other | Attending: Emergency Medicine | Admitting: Emergency Medicine

## 2021-06-27 ENCOUNTER — Encounter (HOSPITAL_COMMUNITY): Payer: Self-pay | Admitting: Emergency Medicine

## 2021-06-27 ENCOUNTER — Other Ambulatory Visit: Payer: Self-pay

## 2021-06-27 DIAGNOSIS — E1165 Type 2 diabetes mellitus with hyperglycemia: Secondary | ICD-10-CM | POA: Insufficient documentation

## 2021-06-27 DIAGNOSIS — J441 Chronic obstructive pulmonary disease with (acute) exacerbation: Secondary | ICD-10-CM | POA: Diagnosis not present

## 2021-06-27 DIAGNOSIS — D631 Anemia in chronic kidney disease: Secondary | ICD-10-CM | POA: Diagnosis not present

## 2021-06-27 DIAGNOSIS — R0602 Shortness of breath: Secondary | ICD-10-CM | POA: Diagnosis not present

## 2021-06-27 DIAGNOSIS — Z7951 Long term (current) use of inhaled steroids: Secondary | ICD-10-CM | POA: Diagnosis not present

## 2021-06-27 DIAGNOSIS — Z7984 Long term (current) use of oral hypoglycemic drugs: Secondary | ICD-10-CM | POA: Diagnosis not present

## 2021-06-27 DIAGNOSIS — N183 Chronic kidney disease, stage 3 unspecified: Secondary | ICD-10-CM | POA: Insufficient documentation

## 2021-06-27 DIAGNOSIS — E1122 Type 2 diabetes mellitus with diabetic chronic kidney disease: Secondary | ICD-10-CM | POA: Insufficient documentation

## 2021-06-27 DIAGNOSIS — R112 Nausea with vomiting, unspecified: Secondary | ICD-10-CM | POA: Insufficient documentation

## 2021-06-27 DIAGNOSIS — Z87891 Personal history of nicotine dependence: Secondary | ICD-10-CM | POA: Diagnosis not present

## 2021-06-27 DIAGNOSIS — Z794 Long term (current) use of insulin: Secondary | ICD-10-CM | POA: Diagnosis not present

## 2021-06-27 DIAGNOSIS — I5033 Acute on chronic diastolic (congestive) heart failure: Secondary | ICD-10-CM | POA: Insufficient documentation

## 2021-06-27 DIAGNOSIS — R739 Hyperglycemia, unspecified: Secondary | ICD-10-CM

## 2021-06-27 DIAGNOSIS — Z743 Need for continuous supervision: Secondary | ICD-10-CM | POA: Diagnosis not present

## 2021-06-27 DIAGNOSIS — Z7901 Long term (current) use of anticoagulants: Secondary | ICD-10-CM | POA: Diagnosis not present

## 2021-06-27 DIAGNOSIS — I130 Hypertensive heart and chronic kidney disease with heart failure and stage 1 through stage 4 chronic kidney disease, or unspecified chronic kidney disease: Secondary | ICD-10-CM | POA: Insufficient documentation

## 2021-06-27 DIAGNOSIS — R6889 Other general symptoms and signs: Secondary | ICD-10-CM | POA: Diagnosis not present

## 2021-06-27 LAB — BLOOD GAS, VENOUS
Acid-Base Excess: 1.7 mmol/L (ref 0.0–2.0)
Bicarbonate: 25 mmol/L (ref 20.0–28.0)
FIO2: 21
O2 Saturation: 79.2 %
Patient temperature: 36.6
pCO2, Ven: 49.7 mmHg (ref 44.0–60.0)
pH, Ven: 7.351 (ref 7.250–7.430)
pO2, Ven: 42.7 mmHg (ref 32.0–45.0)

## 2021-06-27 LAB — BASIC METABOLIC PANEL
Anion gap: 11 (ref 5–15)
BUN: 82 mg/dL — ABNORMAL HIGH (ref 8–23)
CO2: 25 mmol/L (ref 22–32)
Calcium: 8.5 mg/dL — ABNORMAL LOW (ref 8.9–10.3)
Chloride: 88 mmol/L — ABNORMAL LOW (ref 98–111)
Creatinine, Ser: 1.7 mg/dL — ABNORMAL HIGH (ref 0.61–1.24)
GFR, Estimated: 40 mL/min — ABNORMAL LOW (ref >60)
Glucose, Bld: 751 mg/dL (ref 70–99)
Potassium: 5.3 mmol/L — ABNORMAL HIGH (ref 3.5–5.1)
Sodium: 124 mmol/L — ABNORMAL LOW (ref 135–145)

## 2021-06-27 LAB — CBC
HCT: 36.9 % — ABNORMAL LOW (ref 39.0–52.0)
Hemoglobin: 12.6 g/dL — ABNORMAL LOW (ref 13.0–17.0)
MCH: 28.7 pg (ref 26.0–34.0)
MCHC: 34.1 g/dL (ref 30.0–36.0)
MCV: 84.1 fL (ref 80.0–100.0)
Platelets: 338 10*3/uL (ref 150–400)
RBC: 4.39 MIL/uL (ref 4.22–5.81)
RDW: 12.6 % (ref 11.5–15.5)
WBC: 12.9 10*3/uL — ABNORMAL HIGH (ref 4.0–10.5)
nRBC: 0 % (ref 0.0–0.2)

## 2021-06-27 LAB — CBG MONITORING, ED
Glucose-Capillary: >600 mg/dL (ref 70–99)
Glucose-Capillary: 504 mg/dL (ref 70–99)
Glucose-Capillary: 551 mg/dL (ref 70–99)

## 2021-06-27 MED ORDER — METFORMIN HCL 500 MG PO TABS
500.0000 mg | ORAL_TABLET | Freq: Once | ORAL | Status: AC
  Administered 2021-06-27: 500 mg via ORAL
  Filled 2021-06-27: qty 1

## 2021-06-27 MED ORDER — LACTATED RINGERS IV BOLUS
1000.0000 mL | Freq: Once | INTRAVENOUS | Status: AC
  Administered 2021-06-27: 1000 mL via INTRAVENOUS

## 2021-06-27 MED ORDER — INSULIN ASPART 100 UNIT/ML IJ SOLN
0.0000 [IU] | Freq: Once | INTRAMUSCULAR | Status: AC
  Administered 2021-06-27: 15 [IU] via SUBCUTANEOUS
  Filled 2021-06-27: qty 1

## 2021-06-27 MED ORDER — INSULIN ASPART 100 UNIT/ML IJ SOLN
5.0000 [IU] | Freq: Once | INTRAMUSCULAR | Status: AC
  Administered 2021-06-27: 5 [IU] via INTRAVENOUS
  Filled 2021-06-27: qty 1

## 2021-06-27 MED ORDER — CLONIDINE HCL 0.1 MG PO TABS: 0.1000 mg | ORAL_TABLET | Freq: Once | ORAL | Status: DC

## 2021-06-27 NOTE — ED Triage Notes (Signed)
Pt brought in by [facility] from home after family called and stated that pt's blood glucose was reading 597 and that pt had not been taking his meds "like he should". EMS states pt also currently taking ABX for abscess to R wrist area.

## 2021-06-27 NOTE — ED Provider Notes (Signed)
Care transferred to me.  I discussed with the son, [name], who notes that the patient has not been taking his insulin as prescribed and did not take his meds yesterday either.  At times he refuses.  Glucose is somewhat improving and down to 500 after fluids and IV insulin, will give some subcutaneous insulin.  Overall however, the main thing is that he just needs to get back on his meds and take his meds as directed. Otherwise this isn't a quick fix. I do not see a need for admission at this time as it does not appear he is in DKA.  Creatinine is near baseline. Will discharge.   Sherwood Gambler, MD 06/27/21 1013

## 2021-06-27 NOTE — ED Provider Notes (Signed)
[facility] EMERGENCY DEPARTMENT Provider Note   CSN: [id number] Arrival date & time: 06/27/21  0559     History Chief Complaint  Patient presents with   Hyperglycemia    Edward [name] is a 86 y.o. male.   Hyperglycemia Blood sugar level PTA:  597 Severity:  Severe Onset quality:  Unable to specify Timing:  Unable to specify Progression:  Unable to specify Chronicity:  Recurrent Diabetes status:  Controlled with insulin and controlled with oral medications Current diabetic therapy:  "insulin and pills" Context: noncompliance   Relieved by:  None tried Ineffective treatments:  None tried Associated symptoms: increased thirst, nausea, shortness of breath, vomiting and weakness   Associated symptoms: no altered mental status, no blurred vision, no chest pain and no dizziness       Past Medical History:  Diagnosis Date   Allergy    Rhinitis   Atrial fibrillation ([place])    Bronchitis    Chronic respiratory failure ([name])    Colon polyps    COPD (chronic obstructive pulmonary disease) ([place])    Diabetes mellitus    Elevated lipids    Hypercholesterolemia    Hypertension    Iron deficiency anemia due to chronic blood loss 04/30/2018   Noncompliance    On home O2    2L N/C    PSA elevation    Pulmonary fibrosis ([place])    Vitamin D deficiency     Patient Active Problem List   Diagnosis Date Noted   Rib pain on right side 03/30/2021   Sepsis due to methicillin resistant Staphylococcus aureus (MRSA) ([place]) 04/20/2020   MRSA bacteremia 04/16/2020   Acute kidney injury ([place]) 40/98/1191   Acute metabolic encephalopathy 47/82/9562   Altered mental status 03/23/2020   Normocytic anemia 03/23/2020   Mixed hyperlipidemia 03/06/2019   Parotitis 11/01/2018   Syncope 10/30/2018   Facial cellulitis 10/30/2018   Chronic respiratory failure with hypoxia ([place]) 10/30/2018   Chronic diastolic heart failure ([name])    Hypoxia    Atrial fibrillation ([place])    COPD with acute  exacerbation ([place]) 07/31/2018   Hyponatremia 07/31/2018   COPD (chronic obstructive pulmonary disease) ([place]) 05/21/2018   Stage 3 chronic kidney disease ([place]) 13/05/6577   Diastolic congestive heart failure ([place]) 05/21/2018   Type 2 diabetes mellitus ([place]) 05/21/2018   HTN (hypertension) 05/21/2018   Iron deficiency anemia due to chronic blood loss 04/30/2018   Respiratory distress    Hematochezia    Goals of care, counseling/discussion    Palliative care by specialist    DNR (do not resuscitate) discussion    Acute renal failure with acute tubular necrosis superimposed on stage 3 chronic kidney disease ([place])    Palliative care encounter    COPD exacerbation ([place]) 04/17/2018   Acute on chronic diastolic CHF (congestive heart failure) ([name])    Severe anemia 02/17/2018   Bradycardia 02/17/2018   Hypothermia    Gastroesophageal reflux disease    Acute encephalopathy 09/25/2017   On home O2 09/25/2017   Bilateral lower extremity edema 06/27/2017   Insomnia 03/28/2017   CKD (chronic kidney disease), stage III ([place]) 03/07/2017   AF (paroxysmal atrial fibrillation) ([place]) 03/05/2017   Chronic diastolic CHF (congestive heart failure) ([place]) 03/04/2017   HCAP (healthcare-associated pneumonia) 03/04/2017   Sepsis due to pneumonia ([place]) 02/25/2017   Constipation 02/25/2017   Overflow diarrhea/Constipation 02/25/2017   DM type 2 causing vascular disease ([place]) 10/11/2016   Non compliance w medication regimen 12/02/2015   Hypercholesterolemia 05/12/2014  Acute on chronic respiratory failure with hypoxemia ([name]) 11/17/2013   Elevated PSA 01/20/2013   Hypertension    Elevated lipids    Pulmonary fibrosis ([name])    Colon polyps    Colon polyps     Past Surgical History:  Procedure Laterality Date   BIOPSY  04/23/2018   Procedure: BIOPSY;  Surgeon: Danie Binder, MD;  Location: [facility] END[facility] SU[facility];  Service: Endoscopy;;  duodenum gastric   CATARACT EXTRACTION W/PHACO  06/25/2012   Procedure:  CATARACT EXTRACTION PHACO AND INTRAOCULAR LENS PLACEMENT ([place]);  Surgeon: Elta Guadeloupe T. Gershon Crane, MD;  Location: [facility] ORS;  Service: Ophthalmology;  Laterality: Left;  CDE=19.01   CATARACT EXTRACTION W/PHACO  07/09/2012   Procedure: CATARACT EXTRACTION PHACO AND INTRAOCULAR LENS PLACEMENT ([facility]);  Surgeon: Elta Guadeloupe T. Gershon Crane, MD;  Location: [facility] ORS;  Service: Ophthalmology;  Laterality: Right;  CDE: 20.09   COLONOSCOPY WITH PROPOFOL N/A 04/23/2018   Procedure: COLONOSCOPY WITH PROPOFOL;  Surgeon: Danie Binder, MD;  Location: [facility];  Service: Endoscopy;  Laterality: N/A;   ESOPHAGOGASTRODUODENOSCOPY (EGD) WITH PROPOFOL N/A 04/23/2018   Procedure: ESOPHAGOGASTRODUODENOSCOPY (EGD) WITH PROPOFOL;  Surgeon: Danie Binder, MD;  Location: [facility];  Service: Endoscopy;  Laterality: N/A;       Family History  Problem Relation Age of Onset   Heart disease Mother    CAD Other    Diabetes Other     Social History   Tobacco Use   Smoking status: Former    Packs/day: 1.50    Years: 60.00    Pack years: 90.00    Types: Cigarettes    Quit date: 12/31/2012    Years since quitting: 8.4   Smokeless tobacco: Never  Vaping Use   Vaping Use: Never used  Substance Use Topics   Alcohol use: No   Drug use: No    Home Medications Prior to Admission medications   Medication Sig Start Date End Date Taking? Authorizing Provider  acetaminophen (TYLENOL) 500 MG tablet Take 500 mg by mouth every 6 (six) hours as needed for headache.    [provider]  albuterol (PROVENTIL) (2.5 MG/3ML) 0.083% nebulizer solution Take 3 mLs (2.5 mg total) by nebulization every 6 (six) hours as needed for wheezing or shortness of breath. 05/12/21   Susy Frizzle, MD  budesonide-formoterol (SYMBICORT) 160-4.5 MCG/ACT inhaler Inhale 2 puffs into the lungs 2 (two) times daily. 06/17/21   Susy Frizzle, MD  cephALEXin (KEFLEX) 500 MG capsule Take 1 capsule (500 mg total) by mouth 3 (three) times daily. 06/17/21    Susy Frizzle, MD  cloNIDine (CATAPRES) 0.1 MG tablet Take 1 tablet (0.1 mg total) by mouth 2 (two) times daily. 05/12/21   Susy Frizzle, MD  ELIQUIS 5 MG TABS tablet Take 1 tablet by mouth twice daily 03/05/20   Susy Frizzle, MD  furosemide (LASIX) 40 MG tablet TAKE 1 TABLET BY MOUTH DAILY 04/06/21   Susy Frizzle, MD  gabapentin (NEURONTIN) 100 MG capsule TAKE 1 CAPSULE BY MOUTH THREE TIMES DAILY 09/15/20   Susy Frizzle, MD  insulin glargine (LANTUS SOLOSTAR) 100 UNIT/ML Solostar Pen Inject 45 Units into the skin at bedtime. 05/19/21   Susy Frizzle, MD  ipratropium-albuterol (DUONEB) 0.5-2.5 (3) MG/3ML SOLN Take 3 mLs by nebulization every 6 (six) hours as needed. 12/30/19   Susy Frizzle, MD  Lancets (ONETOUCH DELICA PLUS [id number]) MISC USE TO CHECK BLOOD SUGAR TWICE DAILY AS DIRECTED 12/08/20   [name],  [name], MD  Lidocaine (HM LIDOCAINE PATCH) 4 % PTCH Apply 1 patch topically daily. 03/30/21   Eulogio Bear, NP  linezolid (ZYVOX) 600 MG tablet Take 1 tablet (600 mg total) by mouth 2 (two) times daily. 04/19/20   Roxan Hockey, MD  losartan (COZAAR) 25 MG tablet Take 1 tablet (25 mg total) by mouth daily. 08/03/20   Susy Frizzle, MD  metFORMIN (GLUCOPHAGE) 500 MG tablet Take 1 tablet (500 mg total) by mouth 2 (two) times daily with a meal. 05/12/21   Susy Frizzle, MD  metoprolol tartrate (LOPRESSOR) 25 MG tablet TAKE 1 TABLET BY MOUTH TWICE DAILY 06/02/21   Susy Frizzle, MD  [facility] ULTRA test strip CHECK FASTING BLOOD SUGAR TWICE DAILY 02/22/21   Susy Frizzle, MD  OXYGEN Inhale 3 L into the lungs continuous.     [provider]  potassium chloride SA (KLOR-CON) 20 MEQ tablet Take 1 tablet by mouth once daily 03/01/21   Susy Frizzle, MD  pravastatin (PRAVACHOL) 80 MG tablet Take 1 tablet (80 mg total) by mouth at bedtime. 05/12/21   Susy Frizzle, MD  predniSONE (DELTASONE) 20 MG tablet 3 tabs poqday 1-2, 2 tabs poqday 3-4, 1 tab  poqday 5-6 06/17/21   Susy Frizzle, MD  pregabalin (LYRICA) 100 MG capsule TAKE 1 CAPSULE BY MOUTH TWICE DAILY (STOP  GABAPENTIN) 06/07/21   Susy Frizzle, MD  tamsulosin (FLOMAX) 0.4 MG CAPS capsule Take 1 capsule (0.4 mg total) by mouth daily. 05/14/20   Susy Frizzle, MD  traMADol (ULTRAM) 50 MG tablet Take 1 tablet (50 mg total) by mouth every 12 (twelve) hours as needed. 06/21/21   Susy Frizzle, MD    Allergies    Ace inhibitors  Review of Systems   Review of Systems  Unable to perform ROS: Other  Eyes:  Negative for blurred vision.  Respiratory:  Positive for shortness of breath.   Cardiovascular:  Negative for chest pain.  Gastrointestinal:  Positive for nausea and vomiting.  Endocrine: Positive for polydipsia.  Neurological:  Positive for weakness. Negative for dizziness.   Physical Exam Updated Vital Signs BP (!) 174/101 (BP Location: Left Arm)   Pulse (!) 101   Resp (!) 22   Ht 5\' 5"  (1.651 m)   Wt 72 kg   SpO2 94%   BMI 26.41 kg/m   Physical Exam Vitals and nursing note reviewed.  Constitutional:      Appearance: He is well-developed.  HENT:     Head: Normocephalic and atraumatic.     Mouth/Throat:     Mouth: Mucous membranes are moist.     Pharynx: Oropharynx is clear.  Eyes:     Pupils: Pupils are equal, round, and reactive to light.  Cardiovascular:     Rate and Rhythm: Normal rate.  Pulmonary:     Effort: Pulmonary effort is normal. No respiratory distress.  Abdominal:     General: There is no distension.  Musculoskeletal:        General: Normal range of motion.     Cervical back: Normal range of motion.  Skin:    General: Skin is warm and dry.  Neurological:     General: No focal deficit present.     Mental Status: He is alert.    ED Results / Procedures / Treatments   Labs (all labs ordered are listed, but only abnormal results are displayed) Labs Reviewed - No data to display  EKG  None  Radiology No results  found.  Procedures Procedures   Medications Ordered in ED Medications - No data to display  ED Course  I have reviewed the triage vital signs and the nursing notes.  Pertinent labs & imaging results that were available during my care of the patient were reviewed by me and considered in my medical decision making (see chart for details).    MDM Rules/Calculators/A&P                         Patient has a pan positive review of systems was difficult to obtain an accurate history.  Cannot get wife on the phone as her phone goes straight to voicemail.  We will going to treat based on EMS report until further reliable information can be obtained.  Tried calling wife again, still no answer. Not in room. Labs w/ hyperglycemia but not DKA. Will initiate fluids/insulin and reassess.   Pending fluids and recheck cbg at time of care transfer.   Final Clinical Impression(s) / ED Diagnoses Final diagnoses:  None    Rx / DC Orders ED Discharge Orders     None        Levina Boyack, Corene Cornea, MD 06/27/21 2352

## 2021-06-27 NOTE — Discharge Instructions (Signed)
You were give subcutaneous insulin this morning as well as your metformin. Be sure to take your medicines as directed to prevent your glucose/sugar from going up so high.

## 2021-06-27 NOTE — ED Notes (Signed)
Patient aware that we need urine sample for testing, unable at this time. Pt given instruction on providing urine sample when able to do so.   

## 2021-07-02 DIAGNOSIS — J449 Chronic obstructive pulmonary disease, unspecified: Secondary | ICD-10-CM | POA: Diagnosis not present

## 2021-07-03 ENCOUNTER — Other Ambulatory Visit: Payer: Self-pay | Admitting: Family Medicine

## 2021-07-04 ENCOUNTER — Telehealth: Payer: Self-pay

## 2021-07-04 NOTE — Telephone Encounter (Signed)
Pt's son called in requesting a refill of pregabalin (LYRICA) 100 MG capsule   Cb#: [phone]

## 2021-07-05 NOTE — Telephone Encounter (Signed)
Prescription sent to pharmacy.

## 2021-07-07 ENCOUNTER — Emergency Department (HOSPITAL_COMMUNITY)
Admission: EM | Admit: 2021-07-07 | Discharge: 2021-07-08 | Disposition: A | Discharge status: Home / Self Care | Payer: Medicare Other | Attending: Student | Admitting: Student

## 2021-07-07 ENCOUNTER — Emergency Department (HOSPITAL_COMMUNITY): Payer: Medicare Other

## 2021-07-07 DIAGNOSIS — Z794 Long term (current) use of insulin: Secondary | ICD-10-CM | POA: Diagnosis not present

## 2021-07-07 DIAGNOSIS — I130 Hypertensive heart and chronic kidney disease with heart failure and stage 1 through stage 4 chronic kidney disease, or unspecified chronic kidney disease: Secondary | ICD-10-CM | POA: Insufficient documentation

## 2021-07-07 DIAGNOSIS — Z7901 Long term (current) use of anticoagulants: Secondary | ICD-10-CM | POA: Diagnosis not present

## 2021-07-07 DIAGNOSIS — I5032 Chronic diastolic (congestive) heart failure: Secondary | ICD-10-CM | POA: Insufficient documentation

## 2021-07-07 DIAGNOSIS — I480 Paroxysmal atrial fibrillation: Secondary | ICD-10-CM | POA: Diagnosis not present

## 2021-07-07 DIAGNOSIS — Z7951 Long term (current) use of inhaled steroids: Secondary | ICD-10-CM | POA: Diagnosis not present

## 2021-07-07 DIAGNOSIS — R531 Weakness: Secondary | ICD-10-CM | POA: Diagnosis not present

## 2021-07-07 DIAGNOSIS — E1122 Type 2 diabetes mellitus with diabetic chronic kidney disease: Secondary | ICD-10-CM | POA: Diagnosis not present

## 2021-07-07 DIAGNOSIS — Z743 Need for continuous supervision: Secondary | ICD-10-CM | POA: Diagnosis not present

## 2021-07-07 DIAGNOSIS — Z87891 Personal history of nicotine dependence: Secondary | ICD-10-CM | POA: Insufficient documentation

## 2021-07-07 DIAGNOSIS — R4182 Altered mental status, unspecified: Secondary | ICD-10-CM | POA: Diagnosis present

## 2021-07-07 DIAGNOSIS — J441 Chronic obstructive pulmonary disease with (acute) exacerbation: Secondary | ICD-10-CM | POA: Insufficient documentation

## 2021-07-07 DIAGNOSIS — Z7984 Long term (current) use of oral hypoglycemic drugs: Secondary | ICD-10-CM | POA: Diagnosis not present

## 2021-07-07 DIAGNOSIS — Z79899 Other long term (current) drug therapy: Secondary | ICD-10-CM | POA: Diagnosis not present

## 2021-07-07 DIAGNOSIS — N183 Chronic kidney disease, stage 3 unspecified: Secondary | ICD-10-CM | POA: Insufficient documentation

## 2021-07-07 DIAGNOSIS — R404 Transient alteration of awareness: Secondary | ICD-10-CM | POA: Diagnosis not present

## 2021-07-07 DIAGNOSIS — R0902 Hypoxemia: Secondary | ICD-10-CM | POA: Diagnosis not present

## 2021-07-07 LAB — URINALYSIS, ROUTINE W REFLEX MICROSCOPIC
Bilirubin Urine: NEGATIVE
Glucose, UA: >=500 mg/dL — AB
Hgb urine dipstick: NEGATIVE
Ketones, ur: NEGATIVE mg/dL
Leukocytes,Ua: NEGATIVE
Nitrite: NEGATIVE
Protein, ur: 100 mg/dL — AB
Specific Gravity, Urine: 1.011 (ref 1.005–1.030)
pH: 5 (ref 5.0–8.0)

## 2021-07-07 LAB — ETHANOL: Alcohol, Ethyl (B): <10 mg/dL (ref <10)

## 2021-07-07 LAB — CBC WITH DIFFERENTIAL/PLATELET
Abs Immature Granulocytes: 0.03 10*3/uL (ref 0.00–0.07)
Basophils Absolute: 0.1 10*3/uL (ref 0.0–0.1)
Basophils Relative: 1 %
Eosinophils Absolute: 0.1 10*3/uL (ref 0.0–0.5)
Eosinophils Relative: 1 %
HCT: 36.2 % — ABNORMAL LOW (ref 39.0–52.0)
Hemoglobin: 11.9 g/dL — ABNORMAL LOW (ref 13.0–17.0)
Immature Granulocytes: 0 %
Lymphocytes Relative: 25 %
Lymphs Abs: 2.4 10*3/uL (ref 0.7–4.0)
MCH: 28 pg (ref 26.0–34.0)
MCHC: 32.9 g/dL (ref 30.0–36.0)
MCV: 85.2 fL (ref 80.0–100.0)
Monocytes Absolute: 0.8 10*3/uL (ref 0.1–1.0)
Monocytes Relative: 9 %
Neutro Abs: 6.2 10*3/uL (ref 1.7–7.7)
Neutrophils Relative %: 64 %
Platelets: 302 10*3/uL (ref 150–400)
RBC: 4.25 MIL/uL (ref 4.22–5.81)
RDW: 12.6 % (ref 11.5–15.5)
WBC: 9.6 10*3/uL (ref 4.0–10.5)
nRBC: 0 % (ref 0.0–0.2)

## 2021-07-07 LAB — COMPREHENSIVE METABOLIC PANEL
ALT: 12 U/L (ref 0–44)
AST: 16 U/L (ref 15–41)
Albumin: 3.1 g/dL — ABNORMAL LOW (ref 3.5–5.0)
Alkaline Phosphatase: 67 U/L (ref 38–126)
Anion gap: 10 (ref 5–15)
BUN: 57 mg/dL — ABNORMAL HIGH (ref 8–23)
CO2: 29 mmol/L (ref 22–32)
Calcium: 8.6 mg/dL — ABNORMAL LOW (ref 8.9–10.3)
Chloride: 96 mmol/L — ABNORMAL LOW (ref 98–111)
Creatinine, Ser: 1.7 mg/dL — ABNORMAL HIGH (ref 0.61–1.24)
GFR, Estimated: 39 mL/min — ABNORMAL LOW (ref >60)
Glucose, Bld: 181 mg/dL — ABNORMAL HIGH (ref 70–99)
Potassium: 3.5 mmol/L (ref 3.5–5.1)
Sodium: 135 mmol/L (ref 135–145)
Total Bilirubin: 0.5 mg/dL (ref 0.3–1.2)
Total Protein: 5.7 g/dL — ABNORMAL LOW (ref 6.5–8.1)

## 2021-07-07 LAB — RAPID URINE DRUG SCREEN, HOSP PERFORMED
Amphetamines: NOT DETECTED
Barbiturates: NOT DETECTED
Benzodiazepines: NOT DETECTED
Cocaine: NOT DETECTED
Opiates: NOT DETECTED
Tetrahydrocannabinol: NOT DETECTED

## 2021-07-07 LAB — TROPONIN I (HIGH SENSITIVITY)
Troponin I (High Sensitivity): 22 ng/L — ABNORMAL HIGH (ref <18)
Troponin I (High Sensitivity): 24 ng/L — ABNORMAL HIGH (ref <18)

## 2021-07-07 LAB — CK: Total CK: 192 U/L (ref 49–397)

## 2021-07-07 NOTE — ED Triage Notes (Signed)
Pt BIB [facility] from home c/o AMS. Per ems, pt rolled out of bed this morning, and was left on the ground for over 10 hrs before EMS was called. Pt's family stated they tried to get him up but the pt refused , so the family just left him on the floor... pt is also soaked in urine.  A&O X2 to person and place.   -Bp 120/78 -HR 85 -98% RA

## 2021-07-07 NOTE — ED Notes (Signed)
Pt son called for update, names [phone]

## 2021-07-07 NOTE — Social Work (Signed)
CSW attempted to reach both son and spouse to gather collateral information. Neither party answered nor had voicemail option to leave message.

## 2021-07-07 NOTE — Progress Notes (Signed)
CSW called [facility] [employer] due to report of Pt being left on floor for 10 hours. Per [employer] of [facility], report will be screened out.

## 2021-07-07 NOTE — ED Provider Notes (Signed)
[facility] EMERGENCY DEPARTMENT Provider Note   CSN: [id number] Arrival date & time: 07/07/21  1809     History Chief Complaint  Patient presents with   Altered Mental Status    Edward [name] is a 86 y.o. male with PMH atrial fibrillation, COPD, DM, HTN, HLD, pulmonary fibrosis who presents the emergency department for evaluation of altered mental status.  Per EMS, the patient rolled out of bed this morning and was left on the ground for over 10 hours.  Patient found to be soaked in urine and arrives alert and oriented requesting to go home.  Denies chest pain, shortness of breath, [place] pain, nausea, vomiting or other systemic symptoms.   Altered Mental Status Associated symptoms: no abdominal pain, no fever, no palpitations, no rash, no seizures and no vomiting       Past Medical History:  Diagnosis Date   Allergy    Rhinitis   Atrial fibrillation ([name])    Bronchitis    Chronic respiratory failure ([name])    Colon polyps    COPD (chronic obstructive pulmonary disease) ([name])    Diabetes mellitus    Elevated lipids    Hypercholesterolemia    Hypertension    Iron deficiency anemia due to chronic blood loss 04/30/2018   Noncompliance    On home O2    2L N/C    PSA elevation    Pulmonary fibrosis ([name])    Vitamin D deficiency     Patient Active Problem List   Diagnosis Date Noted   Rib pain on right side 03/30/2021   Sepsis due to methicillin resistant Staphylococcus aureus (MRSA) ([place]) 04/20/2020   MRSA bacteremia 04/16/2020   Acute kidney injury ([place]) 43/15/4008   Acute metabolic encephalopathy 67/61/9509   Altered mental status 03/23/2020   Normocytic anemia 03/23/2020   Mixed hyperlipidemia 03/06/2019   Parotitis 11/01/2018   Syncope 10/30/2018   Facial cellulitis 10/30/2018   Chronic respiratory failure with hypoxia ([place]) 10/30/2018   Chronic diastolic heart failure ([name])    Hypoxia    Atrial fibrillation ([place])    COPD with acute  exacerbation ([place]) 07/31/2018   Hyponatremia 07/31/2018   COPD (chronic obstructive pulmonary disease) ([place]) 05/21/2018   Stage 3 chronic kidney disease ([place]) 32/67/1245   Diastolic congestive heart failure ([place]) 05/21/2018   Type 2 diabetes mellitus ([place]) 05/21/2018   HTN (hypertension) 05/21/2018   Iron deficiency anemia due to chronic blood loss 04/30/2018   Respiratory distress    Hematochezia    Goals of care, counseling/discussion    Palliative care by specialist    DNR (do not resuscitate) discussion    Acute renal failure with acute tubular necrosis superimposed on stage 3 chronic kidney disease ([place])    Palliative care encounter    COPD exacerbation ([place]) 04/17/2018   Acute on chronic diastolic CHF (congestive heart failure) ([name])    Severe anemia 02/17/2018   Bradycardia 02/17/2018   Hypothermia    Gastroesophageal reflux disease    Acute encephalopathy 09/25/2017   On home O2 09/25/2017   Bilateral lower extremity edema 06/27/2017   Insomnia 03/28/2017   CKD (chronic kidney disease), stage III ([place]) 03/07/2017   AF (paroxysmal atrial fibrillation) ([name]) 03/05/2017   Chronic diastolic CHF (congestive heart failure) ([place]) 03/04/2017   HCAP (healthcare-associated pneumonia) 03/04/2017   Sepsis due to pneumonia ([place]) 02/25/2017   Constipation 02/25/2017   Overflow diarrhea/Constipation 02/25/2017   DM type 2 causing vascular disease ([place]) 10/11/2016   Non compliance  w medication regimen 12/02/2015   Hypercholesterolemia 05/12/2014   Acute on chronic respiratory failure with hypoxemia ([name]) 11/17/2013   Elevated PSA 01/20/2013   Hypertension    Elevated lipids    Pulmonary fibrosis ([name])    Colon polyps    Colon polyps     Past Surgical History:  Procedure Laterality Date   BIOPSY  04/23/2018   Procedure: BIOPSY;  Surgeon: Danie Binder, MD;  Location: [facility];  Service: Endoscopy;;  duodenum gastric   CATARACT EXTRACTION W/PHACO  06/25/2012   Procedure:  CATARACT EXTRACTION PHACO AND INTRAOCULAR LENS PLACEMENT ([place]);  Surgeon: Elta Guadeloupe T. Gershon Crane, MD;  Location: [facility] ORS;  Service: Ophthalmology;  Laterality: Left;  CDE=19.01   CATARACT EXTRACTION W/PHACO  07/09/2012   Procedure: CATARACT EXTRACTION PHACO AND INTRAOCULAR LENS PLACEMENT (IOC);  Surgeon: Elta Guadeloupe T. Gershon Crane, MD;  Location: [facility] ORS;  Service: Ophthalmology;  Laterality: Right;  CDE: 20.09   COLONOSCOPY WITH PROPOFOL N/A 04/23/2018   Procedure: COLONOSCOPY WITH PROPOFOL;  Surgeon: Danie Binder, MD;  Location: [facility];  Service: Endoscopy;  Laterality: N/A;   ESOPHAGOGASTRODUODENOSCOPY (EGD) WITH PROPOFOL N/A 04/23/2018   Procedure: ESOPHAGOGASTRODUODENOSCOPY (EGD) WITH PROPOFOL;  Surgeon: Danie Binder, MD;  Location: [facility];  Service: Endoscopy;  Laterality: N/A;       Family History  Problem Relation Age of Onset   Heart disease Mother    CAD Other    Diabetes Other     Social History   Tobacco Use   Smoking status: Former    Packs/day: 1.50    Years: 60.00    Pack years: 90.00    Types: Cigarettes    Quit date: 12/31/2012    Years since quitting: 8.5   Smokeless tobacco: Never  Vaping Use   Vaping Use: Never used  Substance Use Topics   Alcohol use: No   Drug use: No    Home Medications Prior to Admission medications   Medication Sig Start Date End Date Taking? Authorizing Provider  acetaminophen (TYLENOL) 500 MG tablet Take 500 mg by mouth every 6 (six) hours as needed for headache.    [provider]  albuterol (PROVENTIL) (2.5 MG/3ML) 0.083% nebulizer solution Take 3 mLs (2.5 mg total) by nebulization every 6 (six) hours as needed for wheezing or shortness of breath. 05/12/21   Susy Frizzle, MD  budesonide-formoterol (SYMBICORT) 160-4.5 MCG/ACT inhaler Inhale 2 puffs into the lungs 2 (two) times daily. 06/17/21   Susy Frizzle, MD  cephALEXin (KEFLEX) 500 MG capsule Take 1 capsule (500 mg total) by mouth 3 (three) times daily. 06/17/21    Susy Frizzle, MD  cloNIDine (CATAPRES) 0.1 MG tablet Take 1 tablet (0.1 mg total) by mouth 2 (two) times daily. 05/12/21   Susy Frizzle, MD  ELIQUIS 5 MG TABS tablet Take 1 tablet by mouth twice daily 03/05/20   Susy Frizzle, MD  furosemide (LASIX) 40 MG tablet TAKE 1 TABLET BY MOUTH DAILY 04/06/21   Susy Frizzle, MD  gabapentin (NEURONTIN) 100 MG capsule TAKE 1 CAPSULE BY MOUTH THREE TIMES DAILY 09/15/20   Susy Frizzle, MD  insulin glargine (LANTUS SOLOSTAR) 100 UNIT/ML Solostar Pen Inject 45 Units into the skin at bedtime. 05/19/21   Susy Frizzle, MD  ipratropium-albuterol (DUONEB) 0.5-2.5 (3) MG/3ML SOLN Take 3 mLs by nebulization every 6 (six) hours as needed. 12/30/19   Susy Frizzle, MD  Lancets (ONETOUCH DELICA PLUS [id number]) MISC USE TO CHECK  BLOOD SUGAR TWICE DAILY AS DIRECTED 12/08/20   Susy Frizzle, MD  Lidocaine (HM LIDOCAINE PATCH) 4 % PTCH Apply 1 patch topically daily. 03/30/21   Eulogio Bear, NP  linezolid (ZYVOX) 600 MG tablet Take 1 tablet (600 mg total) by mouth 2 (two) times daily. 04/19/20   Roxan Hockey, MD  losartan (COZAAR) 25 MG tablet Take 1 tablet (25 mg total) by mouth daily. 08/03/20   Susy Frizzle, MD  metFORMIN (GLUCOPHAGE) 500 MG tablet Take 1 tablet (500 mg total) by mouth 2 (two) times daily with a meal. 05/12/21   Susy Frizzle, MD  metoprolol tartrate (LOPRESSOR) 25 MG tablet TAKE 1 TABLET BY MOUTH TWICE DAILY 06/02/21   Susy Frizzle, MD  [facility] ULTRA test strip CHECK FASTING BLOOD SUGAR TWICE DAILY 02/22/21   Susy Frizzle, MD  OXYGEN Inhale 3 L into the lungs continuous.     [provider]  potassium chloride SA (KLOR-CON) 20 MEQ tablet Take 1 tablet by mouth once daily 03/01/21   Susy Frizzle, MD  pravastatin (PRAVACHOL) 80 MG tablet Take 1 tablet (80 mg total) by mouth at bedtime. 05/12/21   Susy Frizzle, MD  predniSONE (DELTASONE) 20 MG tablet 3 tabs poqday 1-2, 2 tabs poqday 3-4, 1 tab  poqday 5-6 06/17/21   Susy Frizzle, MD  pregabalin (LYRICA) 100 MG capsule TAKE 1 CAPSULE BY MOUTH TWICE DAILY (STOP GABAPENTIN) 07/05/21   Susy Frizzle, MD  tamsulosin (FLOMAX) 0.4 MG CAPS capsule Take 1 capsule (0.4 mg total) by mouth daily. 05/14/20   Susy Frizzle, MD  traMADol (ULTRAM) 50 MG tablet Take 1 tablet (50 mg total) by mouth every 12 (twelve) hours as needed. 06/21/21   Susy Frizzle, MD    Allergies    Ace inhibitors  Review of Systems   Review of Systems  Constitutional:  Negative for chills and fever.  HENT:  Negative for ear pain and sore throat.   Eyes:  Negative for pain and visual disturbance.  Respiratory:  Negative for cough and shortness of breath.   Cardiovascular:  Negative for chest pain and palpitations.  Gastrointestinal:  Negative for abdominal pain and vomiting.  Genitourinary:  Negative for dysuria and hematuria.  Musculoskeletal:  Negative for arthralgias and back pain.  Skin:  Negative for color change and rash.  Neurological:  Negative for seizures and syncope.  All other systems reviewed and are negative.  Physical Exam Updated Vital Signs BP 139/77   Pulse 83   Temp 98.4 F (36.9 C) (Oral)   Resp 18   SpO2 90%   Physical Exam Vitals and nursing note reviewed.  Constitutional:      Appearance: He is well-developed.  HENT:     Head: Normocephalic and atraumatic.  Eyes:     Conjunctiva/sclera: Conjunctivae normal.  Cardiovascular:     Rate and Rhythm: Normal rate and regular rhythm.     Heart sounds: No murmur heard. Pulmonary:     Effort: Pulmonary effort is normal. No respiratory distress.     Breath sounds: Normal breath sounds.  Abdominal:     Palpations: Abdomen is soft.     Tenderness: There is no abdominal tenderness.  Musculoskeletal:     Cervical back: Neck supple.  Skin:    General: Skin is warm and dry.  Neurological:     Mental Status: He is alert.    ED Results / Procedures / Treatments    Labs (all  labs ordered are listed, but only abnormal results are displayed) Labs Reviewed  COMPREHENSIVE METABOLIC PANEL - Abnormal; Notable for the following components:      Result Value   Chloride 96 (*)    Glucose, Bld 181 (*)    BUN 57 (*)    Creatinine, Ser 1.70 (*)    Calcium 8.6 (*)    Total Protein 5.7 (*)    Albumin 3.1 (*)    GFR, Estimated 39 (*)    All other components within normal limits  CBC WITH DIFFERENTIAL/PLATELET - Abnormal; Notable for the following components:   Hemoglobin 11.9 (*)    HCT 36.2 (*)    All other components within normal limits  TROPONIN I (HIGH SENSITIVITY) - Abnormal; Notable for the following components:   Troponin I (High Sensitivity) 22 (*)    All other components within normal limits  TROPONIN I (HIGH SENSITIVITY) - Abnormal; Notable for the following components:   Troponin I (High Sensitivity) 24 (*)    All other components within normal limits  ETHANOL  CK  RAPID URINE DRUG SCREEN, HOSP PERFORMED  URINALYSIS, ROUTINE W REFLEX MICROSCOPIC    EKG None  Radiology CT Head Wo Contrast  Result Date: 07/07/2021 CLINICAL DATA:  Altered mental status, found on ground, initial encounter EXAM: CT HEAD WITHOUT CONTRAST TECHNIQUE: Contiguous axial images were obtained from the base of the skull through the vertex without intravenous contrast. COMPARISON:  03/18/2021 FINDINGS: Brain: No evidence of acute infarction, hemorrhage, hydrocephalus, extra-axial collection or mass lesion/mass effect. Chronic atrophic and ischemic changes are noted. Vascular: No hyperdense vessel or unexpected calcification. Skull: Normal. Negative for fracture or focal lesion. Sinuses/Orbits: Orbits are within normal limits. Minimal mucosal thickening is noted within the maxillary antra. Other: None. IMPRESSION: Chronic atrophic and ischemic changes without acute abnormality. Mild mucosal thickening in the maxillary antra. Electronically Signed   By: Inez Catalina M.D.    On: 07/07/2021 21:18    Procedures Procedures   Medications Ordered in ED Medications - No data to display  ED Course  I have reviewed the triage vital signs and the nursing notes.  Pertinent labs & imaging results that were available during my care of the patient were reviewed by me and considered in my medical decision making (see chart for details).    MDM Rules/Calculators/A&P                           Patient seen the emergency department for evaluation of altered mental status and weakness after a fall.  Physical exam is largely unremarkable.  Laboratory evaluation reveals hypochloremia and 96, BUN 57, creatinine 1.7 which is baseline for this patient.  Hemoglobin 11.9, CK normal at 192, initial troponin 22 with delta troponin 24.  Urinalysis unremarkable.  UDS is pending.  ET head unremarkable.  On reevaluation, patient is still answering questions appropriately and does not appear overly altered but is quite weak.  He is having significant difficulty walking.  He does not meet criteria for inpatient admission, and will likely require significant resources at home versus skilled nursing facility placement.  TOC consult placed and patient signed out to oncoming provider.  Please see provider signout note for continuation of work-up. Final Clinical Impression(s) / ED Diagnoses Final diagnoses:  None    Rx / DC Orders ED Discharge Orders     None        Romyn Boswell, Debe Coder, MD 07/07/21 2324

## 2021-07-08 MED ORDER — FUROSEMIDE 20 MG PO TABS
40.0000 mg | ORAL_TABLET | Freq: Every day | ORAL | Status: DC
  Administered 2021-07-08: 40 mg via ORAL
  Filled 2021-07-08: qty 2

## 2021-07-08 MED ORDER — TRAMADOL HCL 50 MG PO TABS
50.0000 mg | ORAL_TABLET | Freq: Two times a day (BID) | ORAL | Status: DC | PRN
Start: 2021-07-08 — End: 2021-07-08
  Administered 2021-07-08: 50 mg via ORAL
  Filled 2021-07-08: qty 1

## 2021-07-08 MED ORDER — METFORMIN HCL 500 MG PO TABS
500.0000 mg | ORAL_TABLET | Freq: Two times a day (BID) | ORAL | Status: DC
  Administered 2021-07-08: 500 mg via ORAL
  Filled 2021-07-08: qty 1

## 2021-07-08 MED ORDER — PRAVASTATIN SODIUM 40 MG PO TABS
80.0000 mg | ORAL_TABLET | Freq: Every day | ORAL | Status: DC
  Administered 2021-07-08: 80 mg via ORAL
  Filled 2021-07-08: qty 2

## 2021-07-08 MED ORDER — LOSARTAN POTASSIUM 50 MG PO TABS
25.0000 mg | ORAL_TABLET | Freq: Every day | ORAL | Status: DC
  Administered 2021-07-08: 25 mg via ORAL
  Filled 2021-07-08: qty 1

## 2021-07-08 MED ORDER — METOPROLOL TARTRATE 25 MG PO TABS
25.0000 mg | ORAL_TABLET | Freq: Two times a day (BID) | ORAL | Status: DC
  Administered 2021-07-08 (×2): 25 mg via ORAL
  Filled 2021-07-08 (×2): qty 1

## 2021-07-08 MED ORDER — CLONIDINE HCL 0.1 MG PO TABS
0.1000 mg | ORAL_TABLET | Freq: Two times a day (BID) | ORAL | Status: DC
  Administered 2021-07-08 (×2): 0.1 mg via ORAL
  Filled 2021-07-08 (×2): qty 1

## 2021-07-08 NOTE — ED Notes (Signed)
Pt periodically drops to 90-92% while resting. States he wears 02 at home and would like some  Placed on 2L

## 2021-07-08 NOTE — ED Provider Notes (Signed)
I assumed care of patient while he was waiting transitions of care.  I spoke to family/son and informed the patient will be in the ER overnight.  We have attempted to update his med list, but is unclear if he is on Eliquis currently.  After discussion with pharmacy, plan will be to reassess his med list in the morning at that point may need to restart Eliquis Other home meds that have been verified were restarted   Ripley Fraise, MD 07/08/21 [phone]

## 2021-07-08 NOTE — TOC Initial Note (Addendum)
Transition of Care [facility]) - Initial/Assessment Note    Patient Details  Name: Edward [name] MRN: [id number] Date of Birth: 02-Jul-1935  [facility] CM/SW Contact:    Verdell Carmine, RN Phone Number: 07/08/2021, 8:34 AM  Clinical Narrative:                  86 year old patient  from home with relatives. . He apparently fell out of bed and relitives tried to get him up, however they stated that he wanted to stay on the floor, so they left him there for 10 hours. EMS was finally called and the patient was brought to the ED. CSW yesterday called family, no answer and no way to leave a message.  PT consult placed for recommendations..Patient states he wears oxygen at home, placed on 2L here. CM and CSW will follow for needs, recommendations, and transitions.  [facility] called [facility] cannottake due to staffing [facility]- cannot take due to staffing [facility] cannot take due to staffing    Barriers to Discharge: Family Issues   Patient Goals and CMS Choice        Expected Discharge Plan and Services   In-house Referral: Clinical Social Work Discharge Planning Services: CM Consult   Living arrangements for the past 2 months: [place]                                      Prior Living Arrangements/Services Living arrangements for the past 2 months: Single Family Home Lives with:: Relatives Patient language and need for interpreter reviewed:: Yes        Need for Family Participation in Patient Care: Yes (Comment) Care giver support system in place?: Yes (comment)   Criminal Activity/Legal Involvement Pertinent to Current Situation/Hospitalization: No - Comment as needed  Activities of Daily Living      Permission Sought/Granted                  Emotional Assessment       Orientation: : Oriented to Self Alcohol / Substance Use: Not Applicable Psych Involvement: No (comment)  Admission diagnosis:  AMS Patient Active Problem List    Diagnosis Date Noted   Rib pain on right side 03/30/2021   Sepsis due to methicillin resistant Staphylococcus aureus (MRSA) ([place]) 04/20/2020   MRSA bacteremia 04/16/2020   Acute kidney injury ([place]) 37/62/8315   Acute metabolic encephalopathy 17/61/6073   Altered mental status 03/23/2020   Normocytic anemia 03/23/2020   Mixed hyperlipidemia 03/06/2019   Parotitis 11/01/2018   Syncope 10/30/2018   Facial cellulitis 10/30/2018   Chronic respiratory failure with hypoxia ([place]) 10/30/2018   Chronic diastolic heart failure ([name])    Hypoxia    Atrial fibrillation ([place])    COPD with acute exacerbation ([place]) 07/31/2018   Hyponatremia 07/31/2018   COPD (chronic obstructive pulmonary disease) ([place]) 05/21/2018   Stage 3 chronic kidney disease ([place]) 71/03/2693   Diastolic congestive heart failure ([place]) 05/21/2018   Type 2 diabetes mellitus ([place]) 05/21/2018   HTN (hypertension) 05/21/2018   Iron deficiency anemia due to chronic blood loss 04/30/2018   Respiratory distress    Hematochezia    Goals of care, counseling/discussion    Palliative care by specialist    DNR (do not resuscitate) discussion    Acute renal failure with acute tubular necrosis superimposed on stage 3 chronic kidney disease ([place])    Palliative  care encounter    COPD exacerbation ([place]) 04/17/2018   Acute on chronic diastolic CHF (congestive heart failure) ([name])    Severe anemia 02/17/2018   Bradycardia 02/17/2018   Hypothermia    Gastroesophageal reflux disease    Acute encephalopathy 09/25/2017   On home O2 09/25/2017   Bilateral lower extremity edema 06/27/2017   Insomnia 03/28/2017   CKD (chronic kidney disease), stage III ([name]) 03/07/2017   AF (paroxysmal atrial fibrillation) ([name]) 03/05/2017   Chronic diastolic CHF (congestive heart failure) ([place]) 03/04/2017   HCAP (healthcare-associated pneumonia) 03/04/2017   Sepsis due to pneumonia ([place]) 02/25/2017   Constipation 02/25/2017   Overflow diarrhea/Constipation  02/25/2017   DM type 2 causing vascular disease ([place]) 10/11/2016   Non compliance w medication regimen 12/02/2015   Hypercholesterolemia 05/12/2014   Acute on chronic respiratory failure with hypoxemia ([place]) 11/17/2013   Elevated PSA 01/20/2013   Hypertension    Elevated lipids    Pulmonary fibrosis ([place])    Colon polyps    Colon polyps    PCP:  Susy Frizzle, MD Pharmacy:   [place], [place] - [address] [place] [address] Phone: [phone] Fax: [phone]  [facility] [place], [place] [address] [place] [address] Phone: [phone] Fax: [phone]  [facility] [place], [place] - [address] [place] [address] Phone: [phone] Fax: [phone]     Social Determinants of Health (SDOH) Interventions    Readmission Risk Interventions Readmission Risk Prevention Plan 04/20/2020 04/19/2020  Transportation Screening - Complete  Medication Review (RN Care Manager) - Complete  PCP or Specialist appointment within 3-5 days of discharge Not Complete -  PCP/Specialist Appt Not Complete comments appointment is scheduled for 04/26/20 which is six days. Labs to are to be drawn every Monday per attending request. -  HRI or [place] - Complete  SW Recovery Care/Counseling Consult - Complete  Palliative Care Screening - Not Applicable  Some recent data might be hidden

## 2021-07-08 NOTE — ED Notes (Signed)
2 non-documented IV's removed.

## 2021-07-09 ENCOUNTER — Telehealth: Payer: Self-pay

## 2021-07-09 NOTE — Telephone Encounter (Signed)
Secured home health for patient with [facility]. Son called to let him know

## 2021-07-15 ENCOUNTER — Ambulatory Visit (INDEPENDENT_AMBULATORY_CARE_PROVIDER_SITE_OTHER): Payer: Medicare Other | Admitting: Family Medicine

## 2021-07-15 ENCOUNTER — Other Ambulatory Visit: Payer: Self-pay

## 2021-07-15 ENCOUNTER — Encounter: Payer: Self-pay | Admitting: Family Medicine

## 2021-07-15 VITALS — BP 118/54 | HR 58 | Temp 98.5°F | Resp 20 | Ht 65.0 in | Wt 159.0 lb

## 2021-07-15 DIAGNOSIS — E1165 Type 2 diabetes mellitus with hyperglycemia: Secondary | ICD-10-CM | POA: Diagnosis not present

## 2021-07-15 DIAGNOSIS — Z9114 Patient's other noncompliance with medication regimen: Secondary | ICD-10-CM | POA: Diagnosis not present

## 2021-07-15 DIAGNOSIS — J441 Chronic obstructive pulmonary disease with (acute) exacerbation: Secondary | ICD-10-CM | POA: Diagnosis not present

## 2021-07-15 DIAGNOSIS — Z91148 Patient's other noncompliance with medication regimen for other reason: Secondary | ICD-10-CM

## 2021-07-15 MED ORDER — PREDNISONE 20 MG PO TABS: ORAL_TABLET | ORAL | 0 refills | Status: DC

## 2021-07-15 MED ORDER — BUDESONIDE-FORMOTEROL FUMARATE 160-4.5 MCG/ACT IN AERO: 2.0000 | INHALATION_SPRAY | Freq: Two times a day (BID) | RESPIRATORY_TRACT | 11 refills | Status: DC

## 2021-07-15 NOTE — Progress Notes (Signed)
Subjective:    Patient ID: Edward [name], male    DOB: 06/10/1935, 86 y.o.   MRN: [id number]  HPI 06/17/21 Patient has a history of noncompliance.  He has COPD and is oxygen dependent.  He is usually on 2 to 3 L at home.  He is supposed to be on Symbicort daily as a maintenance medication.  He presents today complaining of difficulty breathing.  Pulse oximetry was 85% on room air when he arrived.  He did not bring his oxygen with him.  Apparently he ran out of Symbicort over a week ago.  In the interval time he has been using albuterol as needed.  Today he is wheezing profusely in all 4 lung fields.  There is no increased work of breathing but he is hypoxic on room air.  He also has an elevated respiratory rate.  Son states that he is been increasingly confused which I suspect is due to hypercarbia and hypoxia.  He also has an area of cellulitis on the dorsum of his right wrist.  The area of cellulitis is about 6 cm in diameter and extends circumferentially around what appears to be a papule in the center that is roughly 1 cm in diameter and appears to be an infected insect bite.  Patient states that he had been scratching that recently because it was itching.  At that time, my plan was: Patient is having a COPD exacerbation secondary to discontinuation of Symbicort and noncompliance with therapy.  Patient was given a DuoNeb containing 3 mg of albuterol and 0.5 mg of Atrovent upon arrival.  He was also started on oxygen 2 L.  Breathing improved after this.  Was given Depo-Medrol 60 mg IM x1 now.  Recommended that he resume Symbicort 2 puffs inhaled twice daily immediately and I sent a prescription to his pharmacy.  Also started him on prednisone taper pack.  Encouraged him to use albuterol 2 puffs every 6 hours until breathing has improved.  Seek medical attention over the weekend if worsening.  Treat the cellulitis on his dorsal right forearm with Keflex 500 mg p.o. 3 times daily x7 days.  Recheck here  on Monday or go to the emergency room over the weekend if worsening  06/21/21 Patient is here today for follow-up.  Problem #1, the erythema on the dorsum of the right wrist has faded dramatically since I last saw him.  There is still some mild erythema around a scab-like papule that is approximately 1.5 cm in the center of the sick centimeter patch.  However it is at least 80% better.  The cellulitis seems to be responding to the Keflex well.  Problem #2, the COPD is much better today.  He is no longer wheezing.  Pulse oximetry is up to 92% on room air.  Just a few days ago it was 85% and required 2 to 3 L of oxygen to get it back above 90%.  Strongly encourage the patient to be compliant with his Symbicort.  Noncompliance with medical regimen is the biggest challenge for this patient.  Problem #3, the diabetes.  He is currently on 45 units of Basaglar daily.  His son administers the shot.  I ask how his sugars are running and the son is unable to give me a consensus.  The patient states over 200.  The stone states 1 30-1 50 and sometimes over 200.  I recommended that we need to uptitrate his basal insulin to try to achieve fasting  blood sugars that are more consistent under 130.  #4 the patient reports chronic pain in his right arm.  He shingles with postherpetic neuralgia in the right arm.  He takes tramadol occasionally for pain in the right arm.  He has been getting 10 tablets a month that run out quickly.  He is asking if I will increase the total amount.  He wants to take them twice a day.  07/15/21 Since I last saw the patient, he has been to the emergency room twice.  1 time his blood sugar was over 700 per his son's report.  By the time he got to the emergency room it was over 500.  They were able to get his sugars down into the 400s with IV fluids and he was discharged home.  The second time, the patient was delirious and rolled out of bed.  In the emergency room, they performed a CT scan of the  brain that was normal.  Urinalysis was unremarkable.  Troponins were stable.  Lab work showed a creatinine of 1.7.  The son is adamant that he is giving the patient his insulin.  However the hospital note states that he is being occasionally noncompliant and refuses to take it.  The son states that he may be misses his insulin twice a week.  Sometimes his fasting blood sugars are 130.  That is where it has been the last few days.  Other times the fasting blood sugars are 400-500.  The patient is still taking metformin as well despite having chronic kidney disease.  However he is missing the evening dose.  Today he reports difficulty breathing.  Apparently he stopped taking his Symbicort again and has been out of the Symbicort the last few days.  Today he is wheezing with increased respiratory effort.  He is having an exacerbation of his COPD.  Past Medical History:  Diagnosis Date   Allergy    Rhinitis   Atrial fibrillation ([name])    Bronchitis    Chronic respiratory failure ([name])    Colon polyps    COPD (chronic obstructive pulmonary disease) ([place])    Diabetes mellitus    Elevated lipids    Hypercholesterolemia    Hypertension    Iron deficiency anemia due to chronic blood loss 04/30/2018   Noncompliance    On home O2    2L N/C    PSA elevation    Pulmonary fibrosis ([name])    Vitamin D deficiency    Past Surgical History:  Procedure Laterality Date   BIOPSY  04/23/2018   Procedure: BIOPSY;  Surgeon: Danie Binder, MD;  [facility];  Service: Endoscopy;;  duodenum gastric   CATARACT EXTRACTION W/PHACO  06/25/2012   Procedure: CATARACT EXTRACTION PHACO AND INTRAOCULAR LENS PLACEMENT ([place]);  Surgeon: Elta Guadeloupe T. Gershon Crane, MD;  Location: [facility];  Service: Ophthalmology;  Laterality: Left;  CDE=19.01   CATARACT EXTRACTION W/PHACO  07/09/2012   Procedure: CATARACT EXTRACTION PHACO AND INTRAOCULAR LENS PLACEMENT ([facility]);  Surgeon: Elta Guadeloupe T. Gershon Crane, MD;  Location: [facility];  Service:  Ophthalmology;  Laterality: Right;  CDE: 20.09   COLONOSCOPY WITH PROPOFOL N/A 04/23/2018   Procedure: COLONOSCOPY WITH PROPOFOL;  Surgeon: Danie Binder, MD;  Location: [facility];  Service: Endoscopy;  Laterality: N/A;   ESOPHAGOGASTRODUODENOSCOPY (EGD) WITH PROPOFOL N/A 04/23/2018   Procedure: ESOPHAGOGASTRODUODENOSCOPY (EGD) WITH PROPOFOL;  Surgeon: Danie Binder, MD;  Location: [facility];  Service: Endoscopy;  Laterality: N/A;   Current Outpatient  Medications on File Prior to Visit  Medication Sig Dispense Refill   acetaminophen (TYLENOL) 500 MG tablet Take 500 mg by mouth every 6 (six) hours as needed for headache.     albuterol (PROVENTIL) (2.5 MG/3ML) 0.083% nebulizer solution Take 3 mLs (2.5 mg total) by nebulization every 6 (six) hours as needed for wheezing or shortness of breath. 360 mL 4   cephALEXin (KEFLEX) 500 MG capsule Take 1 capsule (500 mg total) by mouth 3 (three) times daily. 21 capsule 0   cloNIDine (CATAPRES) 0.1 MG tablet Take 1 tablet (0.1 mg total) by mouth 2 (two) times daily. 60 tablet 3   ELIQUIS 5 MG TABS tablet Take 1 tablet by mouth twice daily 60 tablet 11   furosemide (LASIX) 40 MG tablet TAKE 1 TABLET BY MOUTH DAILY 30 tablet 3   gabapentin (NEURONTIN) 100 MG capsule TAKE 1 CAPSULE BY MOUTH THREE TIMES DAILY 90 capsule 0   insulin glargine (LANTUS SOLOSTAR) 100 UNIT/ML Solostar Pen Inject 45 Units into the skin at bedtime. 15 mL 11   ipratropium-albuterol (DUONEB) 0.5-2.5 (3) MG/3ML SOLN Take 3 mLs by nebulization every 6 (six) hours as needed. 360 mL 1   Lancets (ONETOUCH DELICA PLUS [id number]) MISC USE TO CHECK BLOOD SUGAR TWICE DAILY AS DIRECTED 100 each 0   Lidocaine (HM LIDOCAINE PATCH) 4 % PTCH Apply 1 patch topically daily. 1 patch 3   linezolid (ZYVOX) 600 MG tablet Take 1 tablet (600 mg total) by mouth 2 (two) times daily. 60 tablet 0   losartan (COZAAR) 25 MG tablet Take 1 tablet (25 mg total) by mouth daily. 90 tablet 3   metFORMIN  (GLUCOPHAGE) 500 MG tablet Take 1 tablet (500 mg total) by mouth 2 (two) times daily with a meal. 60 tablet 3   metoprolol tartrate (LOPRESSOR) 25 MG tablet TAKE 1 TABLET BY MOUTH TWICE DAILY 60 tablet 3   ONETOUCH ULTRA test strip CHECK FASTING BLOOD SUGAR TWICE DAILY 50 strip 3   OXYGEN Inhale 3 L into the lungs continuous.      potassium chloride SA (KLOR-CON) 20 MEQ tablet Take 1 tablet by mouth once daily 90 tablet 0   pravastatin (PRAVACHOL) 80 MG tablet Take 1 tablet (80 mg total) by mouth at bedtime. 30 tablet 3   predniSONE (DELTASONE) 20 MG tablet 3 tabs poqday 1-2, 2 tabs poqday 3-4, 1 tab poqday 5-6 12 tablet 0   pregabalin (LYRICA) 100 MG capsule TAKE 1 CAPSULE BY MOUTH TWICE DAILY (STOP GABAPENTIN) 60 capsule 0   tamsulosin (FLOMAX) 0.4 MG CAPS capsule Take 1 capsule (0.4 mg total) by mouth daily. 30 capsule 3   traMADol (ULTRAM) 50 MG tablet Take 1 tablet (50 mg total) by mouth every 12 (twelve) hours as needed. 30 tablet 0   No current facility-administered medications on file prior to visit.   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  Tobacco Use   Smoking status: Former    Packs/day: 1.50    Years: 60.00    Pack years: 90.00    Types: Cigarettes  Quit date: 12/31/2012    Years since quitting: 8.5   Smokeless tobacco: Never  Vaping Use   Vaping Use: Never used  Substance and Sexual Activity   Alcohol use: No   Drug use: No   Sexual activity: Yes    Birth control/protection: None  Other Topics Concern   Not on file  Social History Narrative   Not on file   Social  Determinants of Health   Financial Resource Strain: Not on file  Food Insecurity: Not on file  Transportation Needs: Not on file  Physical Activity: Not on file  Stress: Not on file  Social Connections: Not on file  Intimate Partner Violence: Not on file     Review of Systems     Objective:   Physical Exam Vitals reviewed.  Constitutional:      Appearance: He is obese. He is not ill-appearing (Patient is wearing his pants backward.  Smells of urine.) or toxic-appearing.  HENT:     Mouth/Throat:     Mouth: Mucous membranes are dry.  Cardiovascular:     Rate and Rhythm: Normal rate. Rhythm irregular.     Heart sounds: Normal heart sounds.  Pulmonary:     Effort: Pulmonary effort is normal. No respiratory distress.     Breath sounds: Decreased air movement present. No stridor. Decreased breath sounds and wheezing present. No rhonchi.  Abdominal:     General: Abdomen is flat. There is no distension.     Palpations: Abdomen is soft.     Tenderness: There is no abdominal tenderness. There is no guarding.  Musculoskeletal:       Arms:     Cervical back: Neck supple.     Right lower leg: No edema.     Left lower leg: No edema.  Lymphadenopathy:     Cervical: No cervical adenopathy.  Neurological:     Mental Status: He is alert.          Assessment & Plan:  COPD with acute exacerbation ([place])  Uncontrolled type 2 diabetes mellitus with hyperglycemia ([place])  Non compliance w medication regimen I explained to the son and the patient that I feel he has age-related memory loss.  I believe that there is early dementia.  The son is giving him his insulin but the patient is administering all his other medications.  I feel that he likely is being noncompliant with his medication due to the dementia.  I believe that this is likely the explanation for his occasional delirium.  The patient requires more supervision than he is currently receiving.  The son must administer all of his  medication.  The patient is unable to do this anymore.  I was adamant with the son about this.  I want him to increase Lantus to 60 units a day and report fasting blood sugars to me on Monday so that I can continue to uptitrate insulin as necessary.  He must stop metformin due to his kidney disease.  I did put him on prednisone taper pack due to the wheezing and bronchospasms and COPD exacerbation as he is obviously decompensating.  However I was adamant that he must take his Symbicort 2 puffs twice daily every day.  This is been his biggest issue is he stops the medication and does not get a refill.  I sent plenty refills to his pharmacy.  If this continues, we will have to pursue placement.  Recheck on Monday

## 2021-07-18 ENCOUNTER — Telehealth: Payer: Self-pay

## 2021-07-18 ENCOUNTER — Other Ambulatory Visit: Payer: Self-pay | Admitting: Family Medicine

## 2021-07-18 DIAGNOSIS — J441 Chronic obstructive pulmonary disease with (acute) exacerbation: Secondary | ICD-10-CM

## 2021-07-18 MED ORDER — TRAMADOL HCL 50 MG PO TABS: 50.0000 mg | ORAL_TABLET | Freq: Two times a day (BID) | ORAL | 0 refills | Status: DC | PRN

## 2021-07-18 NOTE — Telephone Encounter (Signed)
Ok to refill Tramadol?? Last office visit 07/15/2021. Last refill 06/21/2021.  Please review CBG and advise.

## 2021-07-18 NOTE — Telephone Encounter (Signed)
Pt's son called in wanting to give pt's sugar readings from over the weekend: Friday morning 401, Friday night almost 600, Sat  morning still in the 600's, Sat night 333, Sun morning 600, and Sun night 136. Pt's son also request a refill of traMADol (ULTRAM) 50 MG tablet    Cb#: [phone]

## 2021-07-18 NOTE — Telephone Encounter (Signed)
Call placed to patient son to inquire and verify FSBS numbers and insulin administration.   No answer. No VM.   insulin glargine (LANTUS SOLOSTAR) 100 UNIT/ML Solostar Pen 15 mL 11 05/19/2021    Sig - Route: Inject 45 Units into the skin at bedtime. - Subcutaneous   Patient taking differently: Inject 60 Units into the skin at bedtime.       Sent to pharmacy as: insulin glargine (LANTUS SOLOSTAR) 100 UNIT/ML Solostar Pen

## 2021-07-19 NOTE — Telephone Encounter (Signed)
Call placed to patient. No answer. No VM.  

## 2021-07-20 NOTE — Telephone Encounter (Signed)
Call placed to patient. No answer. No VM.  

## 2021-07-21 NOTE — Telephone Encounter (Signed)
Multiple calls placed to patient with no answer and no return call.   Message to be closed.   Letter sent to patient to schedule appointment to discuss.

## 2021-07-26 ENCOUNTER — Other Ambulatory Visit: Payer: Self-pay | Admitting: Family Medicine

## 2021-07-26 DIAGNOSIS — J441 Chronic obstructive pulmonary disease with (acute) exacerbation: Secondary | ICD-10-CM

## 2021-07-26 MED ORDER — TRAMADOL HCL 50 MG PO TABS
50.0000 mg | ORAL_TABLET | Freq: Two times a day (BID) | ORAL | 0 refills | Status: DC | PRN
Start: 2021-07-26 — End: 2021-08-16

## 2021-07-26 NOTE — Telephone Encounter (Signed)
Received call from patient's son Edward [name] to request refill of traMADol (ULTRAM) 50 MG tablet   Patient took last dose 10/23 or 10/24  Pharmacy confirmed as   [place], [place] - [address] [place] [address]  [address] [place], [place] [address]  Phone:  [phone]  Fax:  [phone]   Please advise at [phone]

## 2021-07-26 NOTE — Telephone Encounter (Signed)
Ok to refill??  Last office visit 07/15/2021.  Last refill 07/18/2021, #30 tabs.

## 2021-07-28 ENCOUNTER — Telehealth: Payer: Self-pay | Admitting: Pharmacist

## 2021-07-28 NOTE — Progress Notes (Signed)
    Chronic Care Management Pharmacy Assistant   Name: [name]  MRN: [id number] DOB: 1934/10/08  Reason for Encounter: Renewal PAP  Medications: Outpatient Encounter Medications as of 07/28/2021  Medication Sig   acetaminophen (TYLENOL) 500 MG tablet Take 500 mg by mouth every 6 (six) hours as needed for headache.   albuterol (PROVENTIL) (2.5 MG/3ML) 0.083% nebulizer solution Take 3 mLs (2.5 mg total) by nebulization every 6 (six) hours as needed for wheezing or shortness of breath.   budesonide-formoterol (SYMBICORT) 160-4.5 MCG/ACT inhaler Inhale 2 puffs into the lungs 2 (two) times daily.   cloNIDine (CATAPRES) 0.1 MG tablet Take 1 tablet (0.1 mg total) by mouth 2 (two) times daily.   ELIQUIS 5 MG TABS tablet Take 1 tablet by mouth twice daily   furosemide (LASIX) 40 MG tablet TAKE 1 TABLET BY MOUTH DAILY   gabapentin (NEURONTIN) 100 MG capsule TAKE 1 CAPSULE BY MOUTH THREE TIMES DAILY   insulin glargine (LANTUS SOLOSTAR) 100 UNIT/ML Solostar Pen Inject 45 Units into the skin at bedtime. (Patient taking differently: Inject 60 Units into the skin at bedtime.)   ipratropium-albuterol (DUONEB) 0.5-2.5 (3) MG/3ML SOLN Take 3 mLs by nebulization every 6 (six) hours as needed.   Lancets (ONETOUCH DELICA PLUS [id number]) MISC USE TO CHECK BLOOD SUGAR TWICE DAILY AS DIRECTED   Lidocaine (HM LIDOCAINE PATCH) 4 % PTCH Apply 1 patch topically daily.   losartan (COZAAR) 25 MG tablet Take 1 tablet (25 mg total) by mouth daily.   metoprolol tartrate (LOPRESSOR) 25 MG tablet TAKE 1 TABLET BY MOUTH TWICE DAILY   ONETOUCH ULTRA test strip CHECK FASTING BLOOD SUGAR TWICE DAILY   OXYGEN Inhale 3 L into the lungs continuous.    potassium chloride SA (KLOR-CON) 20 MEQ tablet Take 1 tablet by mouth once daily   pravastatin (PRAVACHOL) 80 MG tablet Take 1 tablet (80 mg total) by mouth at bedtime.   predniSONE (DELTASONE) 20 MG tablet 3 tabs poqday 1-2, 2 tabs poqday 3-4, 1 tab poqday 5-6   predniSONE  (DELTASONE) 20 MG tablet 3 tabs poqday 1-2, 2 tabs poqday 3-4, 1 tab poqday 5-6   pregabalin (LYRICA) 100 MG capsule TAKE 1 CAPSULE BY MOUTH TWICE DAILY (STOP GABAPENTIN)   tamsulosin (FLOMAX) 0.4 MG CAPS capsule Take 1 capsule (0.4 mg total) by mouth daily.   traMADol (ULTRAM) 50 MG tablet Take 1 tablet (50 mg total) by mouth every 12 (twelve) hours as needed.   No facility-administered encounter medications on file as of 07/28/2021.   New patient assistance application form filled out to [employer] for [employer]. Waiting for patient and provider to complete and sign documentation. Called patient to inquire if they wanted the application mailed to them or if they wanted to come into the office. Patient is required to sign application and to bring/have proof of income. He stated he would be willing to come into office to bring proof of income and sign application once all of his paperwork came in the mail he would like it mailed to their residence address [address] [place] [address]   Follow-up:Pharmacist Review  Veronica Mclemore, [place] Pharmacist Assistant [phone]

## 2021-08-02 ENCOUNTER — Other Ambulatory Visit: Payer: Self-pay

## 2021-08-02 DIAGNOSIS — J449 Chronic obstructive pulmonary disease, unspecified: Secondary | ICD-10-CM | POA: Diagnosis not present

## 2021-08-02 MED ORDER — PREGABALIN 100 MG PO CAPS: 100.0000 mg | ORAL_CAPSULE | Freq: Two times a day (BID) | ORAL | 3 refills | Status: DC

## 2021-08-02 NOTE — Telephone Encounter (Signed)
Ok to refill??  Last office visit 07/18/2021.  Last refill 07/05/2021.

## 2021-08-02 NOTE — Telephone Encounter (Signed)
Pt's son called in requesting a refill of pregabalin (LYRICA) 100 MG capsule.   Cb#: [phone]

## 2021-08-04 ENCOUNTER — Telehealth: Payer: Self-pay | Admitting: Pharmacist

## 2021-08-04 NOTE — Progress Notes (Signed)
Chronic Care Management Pharmacy Assistant   Name: Edward [name]  MRN: [id number] DOB: December 09, 1934  Reason for Encounter: Disease State For DM and HTN.   Conditions to be addressed/monitored: Type II DM, Hypertension, hyperlipidemia, COPD, CKD stage III, AFIB, CHF.  Recent office visits:  07/15/21 Dr. Dennard Schaumann For ER follow-up. Per note: INCREASE Lantus to 60 units daily STOPPED Metformin. STARTED Prednisone 20 mg pack,   Recent consult visits:  None since 06/23/21  Hospital visits:  07/07/21 [facility] ER Department (16 Hours) Kommor, Debe Coder, MD. For weakness.No medication changes. 06/27/21 [facility] ER Department (4 Hours) Sherwood Gambler, MD. For hyperglycemia. No medication changes.   Medications: Outpatient Encounter Medications as of 08/04/2021  Medication Sig   acetaminophen (TYLENOL) 500 MG tablet Take 500 mg by mouth every 6 (six) hours as needed for headache.   albuterol (PROVENTIL) (2.5 MG/3ML) 0.083% nebulizer solution Take 3 mLs (2.5 mg total) by nebulization every 6 (six) hours as needed for wheezing or shortness of breath.   budesonide-formoterol (SYMBICORT) 160-4.5 MCG/ACT inhaler Inhale 2 puffs into the lungs 2 (two) times daily.   cloNIDine (CATAPRES) 0.1 MG tablet Take 1 tablet (0.1 mg total) by mouth 2 (two) times daily.   ELIQUIS 5 MG TABS tablet Take 1 tablet by mouth twice daily   furosemide (LASIX) 40 MG tablet TAKE 1 TABLET BY MOUTH DAILY   gabapentin (NEURONTIN) 100 MG capsule TAKE 1 CAPSULE BY MOUTH THREE TIMES DAILY   insulin glargine (LANTUS SOLOSTAR) 100 UNIT/ML Solostar Pen Inject 45 Units into the skin at bedtime. (Patient taking differently: Inject 60 Units into the skin at bedtime.)   ipratropium-albuterol (DUONEB) 0.5-2.5 (3) MG/3ML SOLN Take 3 mLs by nebulization every 6 (six) hours as needed.   Lancets (ONETOUCH DELICA PLUS [id number]) MISC USE TO CHECK BLOOD SUGAR TWICE DAILY AS DIRECTED   Lidocaine (HM LIDOCAINE PATCH) 4 % PTCH  Apply 1 patch topically daily.   losartan (COZAAR) 25 MG tablet Take 1 tablet (25 mg total) by mouth daily.   metoprolol tartrate (LOPRESSOR) 25 MG tablet TAKE 1 TABLET BY MOUTH TWICE DAILY   ONETOUCH ULTRA test strip CHECK FASTING BLOOD SUGAR TWICE DAILY   OXYGEN Inhale 3 L into the lungs continuous.    potassium chloride SA (KLOR-CON) 20 MEQ tablet Take 1 tablet by mouth once daily   pravastatin (PRAVACHOL) 80 MG tablet Take 1 tablet (80 mg total) by mouth at bedtime.   predniSONE (DELTASONE) 20 MG tablet 3 tabs poqday 1-2, 2 tabs poqday 3-4, 1 tab poqday 5-6   predniSONE (DELTASONE) 20 MG tablet 3 tabs poqday 1-2, 2 tabs poqday 3-4, 1 tab poqday 5-6   pregabalin (LYRICA) 100 MG capsule Take 1 capsule (100 mg total) by mouth 2 (two) times daily.   tamsulosin (FLOMAX) 0.4 MG CAPS capsule Take 1 capsule (0.4 mg total) by mouth daily.   traMADol (ULTRAM) 50 MG tablet Take 1 tablet (50 mg total) by mouth every 12 (twelve) hours as needed.   No facility-administered encounter medications on file as of 08/04/2021.   Recent Relevant Labs: Lab Results  Component Value Date/Time   HGBA1C 12.6 (H) 03/24/2021 12:33 PM   HGBA1C 13.4 (H) 12/24/2020 08:29 AM   HGBA1C >14.0 (H) 12/01/2015 08:31 AM   HGBA1C >14.0 (H) 05/27/2015 08:54 AM   MICROALBUR CANCELED 12/24/2020 08:29 AM   MICROALBUR 378.4 01/23/2020 03:09 PM    Kidney Function Lab Results  Component Value Date/Time   CREATININE 1.70 (H)  07/07/2021 06:21 PM   CREATININE 1.70 (H) 06/27/2021 06:12 AM   CREATININE 1.36 (H) 03/30/2021 11:13 AM   CREATININE 1.67 (H) 03/24/2021 12:33 PM   GFRNONAA 39 (L) 07/07/2021 06:21 PM   GFRNONAA 48 (L) 03/30/2021 11:13 AM   GFRAA 55 (L) 03/30/2021 11:13 AM    Current antihyperglycemic regimen:  Lantus Solostar 60 units into the skin daily  What recent interventions/DTPs have been made to improve glycemic control:  STOPPED Metformin 500 mg 1 tablet twice daily. CHANGED Lantus to 60 units  daily.  Have there been any recent hospitalizations or ED visits since last visit with CPP? Yes, documented above.  Adherence Review: Is the patient currently on a STATIN medication? Pravastatin 80 mg   Is the patient currently on ACE/ARB medication? Losartan 25 mg  Does the patient have >5 day gap between last estimated fill dates? Per misc prts, no.   Reviewed chart prior to disease state call. Spoke with patient regarding BP  Recent Office Vitals: BP Readings from Last 3 Encounters:  07/15/21 (!) 118/54  07/08/21 111/69  06/27/21 (!) 161/80   Pulse Readings from Last 3 Encounters:  07/15/21 (!) 58  07/08/21 62  06/27/21 95    Wt Readings from Last 3 Encounters:  07/15/21 159 lb (72.1 kg)  06/27/21 158 lb 11.7 oz (72 kg)  06/21/21 157 lb 12.8 oz (71.6 kg)     Kidney Function Lab Results  Component Value Date/Time   CREATININE 1.70 (H) 07/07/2021 06:21 PM   CREATININE 1.70 (H) 06/27/2021 06:12 AM   CREATININE 1.36 (H) 03/30/2021 11:13 AM   CREATININE 1.67 (H) 03/24/2021 12:33 PM   GFRNONAA 39 (L) 07/07/2021 06:21 PM   GFRNONAA 48 (L) 03/30/2021 11:13 AM   GFRAA 55 (L) 03/30/2021 11:13 AM    BMP Latest Ref Rng & Units 07/07/2021 06/27/2021 03/30/2021  Glucose 70 - 99 mg/dL 181(H) 751(HH) 101(H)  BUN 8 - 23 mg/dL 57(H) 82(H) 68(H)  Creatinine 0.61 - 1.24 mg/dL 1.70(H) 1.70(H) 1.36(H)  BUN/Creat Ratio 6 - 22 (calc) - - 50(H)  Sodium 135 - 145 mmol/L 135 124(L) 135  Potassium 3.5 - 5.1 mmol/L 3.5 5.3(H) 4.8  Chloride 98 - 111 mmol/L 96(L) 88(L) 96(L)  CO2 22 - 32 mmol/L 29 25 30   Calcium 8.9 - 10.3 mg/dL 8.6(L) 8.5(L) 10.1    Current antihypertensive regimen:  Losartan 25 mg 1 tablet daily  Clonidine 0.1 mg 1 tablet 2 times daily  What recent interventions/DTPs have been made by any provider to improve Blood Pressure control since last CPP Visit: None.  Any recent hospitalizations or ED visits since last visit with CPP? Yes, documented above.   Adherence  Review: Is the patient currently on ACE/ARB medication? Losartan 25 mg  Does the patient have >5 day gap between last estimated fill dates? Per misc rpts, no.  Care Gaps:Patient is due for eye and foot exam.   Star Rating Drugs:Pravastatin 80 mg 07/14/21 30 DS, metformin 500 mg 07/14/21 30 DS, Losartan 25 mg 01/28/21 90 DS.   Third unsuccessful telephone outreach was attempted today. The patient was referred to the pharmacist for assistance with care management and care coordination.   Follow-Up:Pharmacist Review  Charlann Lange, [place] Pharmacist Assistant [phone]

## 2021-08-12 ENCOUNTER — Other Ambulatory Visit: Payer: Self-pay | Admitting: Family Medicine

## 2021-08-12 DIAGNOSIS — R600 Localized edema: Secondary | ICD-10-CM

## 2021-08-12 DIAGNOSIS — I5032 Chronic diastolic (congestive) heart failure: Secondary | ICD-10-CM

## 2021-08-12 DIAGNOSIS — Z09 Encounter for follow-up examination after completed treatment for conditions other than malignant neoplasm: Secondary | ICD-10-CM

## 2021-08-16 ENCOUNTER — Other Ambulatory Visit: Payer: Self-pay | Admitting: Family Medicine

## 2021-08-16 DIAGNOSIS — J441 Chronic obstructive pulmonary disease with (acute) exacerbation: Secondary | ICD-10-CM

## 2021-08-16 MED ORDER — TRAMADOL HCL 50 MG PO TABS: 50.0000 mg | ORAL_TABLET | Freq: Two times a day (BID) | ORAL | 0 refills | Status: DC | PRN

## 2021-08-16 NOTE — Telephone Encounter (Signed)
Patient's son [name] called to request refill of traMADol (ULTRAM) 50 MG tablet [[id number]]   Pharmacy confirmed as  [place], [place] - [address]  [address] [place], [place] [address]  Phone:  [phone]  Fax:  [phone]   Please advise at [phone]

## 2021-08-16 NOTE — Telephone Encounter (Signed)
Ok to refill??  Last office visit 07/15/2021.  Last refill 07/26/2021, # 30 tabs.   Of note, patient has been filling prescription ~ Q2 weeks. Ok to increase quantity?

## 2021-08-29 ENCOUNTER — Telehealth: Payer: Self-pay | Admitting: Family Medicine

## 2021-08-29 NOTE — Telephone Encounter (Signed)
I attempted to leave  message for patient to call back and schedule Medicare Annual Wellness Visit (AWV) in office. No voice mail.  If not able to come in office, please offer to do virtually or by telephone.  Left office number and my jabber #[phone].  Due for AWVI   Please schedule at anytime with Nurse Health Advisor.   

## 2021-09-01 DIAGNOSIS — J449 Chronic obstructive pulmonary disease, unspecified: Secondary | ICD-10-CM | POA: Diagnosis not present

## 2021-09-05 ENCOUNTER — Other Ambulatory Visit: Payer: Self-pay

## 2021-09-05 ENCOUNTER — Ambulatory Visit (INDEPENDENT_AMBULATORY_CARE_PROVIDER_SITE_OTHER): Payer: Medicare Other | Admitting: Family Medicine

## 2021-09-05 ENCOUNTER — Encounter: Payer: Self-pay | Admitting: Family Medicine

## 2021-09-05 VITALS — BP 128/62 | HR 66 | Temp 97.6°F | Resp 18 | Ht 65.0 in | Wt 157.0 lb

## 2021-09-05 DIAGNOSIS — Z9114 Patient's other noncompliance with medication regimen: Secondary | ICD-10-CM | POA: Diagnosis not present

## 2021-09-05 DIAGNOSIS — E1165 Type 2 diabetes mellitus with hyperglycemia: Secondary | ICD-10-CM | POA: Diagnosis not present

## 2021-09-05 DIAGNOSIS — N1832 Chronic kidney disease, stage 3b: Secondary | ICD-10-CM

## 2021-09-05 MED ORDER — PIOGLITAZONE HCL 30 MG PO TABS: 30.0000 mg | ORAL_TABLET | Freq: Every day | ORAL | 3 refills | Status: DC

## 2021-09-05 NOTE — Addendum Note (Signed)
Addended by: Jenna Luo T on: 09/05/2021 08:20 AM   Modules accepted: Orders

## 2021-09-05 NOTE — Progress Notes (Signed)
Subjective:    Patient ID: Edward [name], male    DOB: 06/10/1935, 86 y.o.   MRN: [id number]  HPI 06/17/21 Patient has a history of noncompliance.  He has COPD and is oxygen dependent.  He is usually on 2 to 3 L at home.  He is supposed to be on Symbicort daily as a maintenance medication.  He presents today complaining of difficulty breathing.  Pulse oximetry was 85% on room air when he arrived.  He did not bring his oxygen with him.  Apparently he ran out of Symbicort over a week ago.  In the interval time he has been using albuterol as needed.  Today he is wheezing profusely in all 4 lung fields.  There is no increased work of breathing but he is hypoxic on room air.  He also has an elevated respiratory rate.  Son states that he is been increasingly confused which I suspect is due to hypercarbia and hypoxia.  He also has an area of cellulitis on the dorsum of his right wrist.  The area of cellulitis is about 6 cm in diameter and extends circumferentially around what appears to be a papule in the center that is roughly 1 cm in diameter and appears to be an infected insect bite.  Patient states that he had been scratching that recently because it was itching.  At that time, my plan was: Patient is having a COPD exacerbation secondary to discontinuation of Symbicort and noncompliance with therapy.  Patient was given a DuoNeb containing 3 mg of albuterol and 0.5 mg of Atrovent upon arrival.  He was also started on oxygen 2 L.  Breathing improved after this.  Was given Depo-Medrol 60 mg IM x1 now.  Recommended that he resume Symbicort 2 puffs inhaled twice daily immediately and I sent a prescription to his pharmacy.  Also started him on prednisone taper pack.  Encouraged him to use albuterol 2 puffs every 6 hours until breathing has improved.  Seek medical attention over the weekend if worsening.  Treat the cellulitis on his dorsal right forearm with Keflex 500 mg p.o. 3 times daily x7 days.  Recheck here  on Monday or go to the emergency room over the weekend if worsening  06/21/21 Patient is here today for follow-up.  Problem #1, the erythema on the dorsum of the right wrist has faded dramatically since I last saw him.  There is still some mild erythema around a scab-like papule that is approximately 1.5 cm in the center of the sick centimeter patch.  However it is at least 80% better.  The cellulitis seems to be responding to the Keflex well.  Problem #2, the COPD is much better today.  He is no longer wheezing.  Pulse oximetry is up to 92% on room air.  Just a few days ago it was 85% and required 2 to 3 L of oxygen to get it back above 90%.  Strongly encourage the patient to be compliant with his Symbicort.  Noncompliance with medical regimen is the biggest challenge for this patient.  Problem #3, the diabetes.  He is currently on 45 units of Basaglar daily.  His son administers the shot.  I ask how his sugars are running and the son is unable to give me a consensus.  The patient states over 200.  The stone states 1 30-1 50 and sometimes over 200.  I recommended that we need to uptitrate his basal insulin to try to achieve fasting  blood sugars that are more consistent under 130.  #4 the patient reports chronic pain in his right arm.  He shingles with postherpetic neuralgia in the right arm.  He takes tramadol occasionally for pain in the right arm.  He has been getting 10 tablets a month that run out quickly.  He is asking if I will increase the total amount.  He wants to take them twice a day.  At that time, my plan was:  Erythema has faded dramatically.  Cellulitis seems to be resolving well that coupled with the improvement in his hypoxemia has caused the patient to return back to his baseline level of mental acuity.  Complete Keflex.  I recommended a shave biopsy of the scab-like papule in the center of the dorsum of the right forearm where the erythema is located if the lesion persists more than 2 weeks  after resolution of the cellulitis.  COPD is back to his baseline.  Complete the prednisone and be consistent in taking Symbicort 2 puffs twice daily.  Increase insulin to 50 units a day and report fasting blood sugars to me in 1 week.  Uptitrate insulin to achieve fasting blood sugars under 130 if possible.  Encourage compliance.  Increase tramadol to 30 tablets/month.  This would allow him 1 to 2 tablets a day on 50% of the days.  I believe that this is reasonable.  09/05/21  There is discrepancy about what the patient is taking.  He has stopped taking his Eliquis due to cost.  The prescription will involve $100 and he has been off the Eliquis now for more than a month.  Earlier in the year was less than $40.  Most likely he is in the donut hole.  I gave the patient samples today of Eliquis hopefully get him through the rest of the year encouraged him to resume the Eliquis prescribed prevention.  He is taking 50 units of insulin a day per the son's report.  He brings in blood sugars for me to review today.  Sugars range primarily from 180-260.  The vast majority are 180-220.  These are apparently fasting sugars although somewhat taken before breakfast and somewhat correctable..  I have stopped his metformin at his last visit due to his creatinine being over 1.7.  The patient is restarted back on his metformin Past Medical History:  Diagnosis Date   Allergy    Rhinitis   Atrial fibrillation ([name])    Bronchitis    Chronic respiratory failure ([name])    Colon polyps    COPD (chronic obstructive pulmonary disease) ([name])    Diabetes mellitus    Elevated lipids    Hypercholesterolemia    Hypertension    Iron deficiency anemia due to chronic blood loss 04/30/2018   Noncompliance    On home O2    2L N/C    PSA elevation    Pulmonary fibrosis ([name])    Vitamin D deficiency    Past Surgical History:  Procedure Laterality Date   BIOPSY  04/23/2018   Procedure: BIOPSY;  Surgeon: Danie Binder, MD;   [facility];  Service: Endoscopy;;  duodenum gastric   CATARACT EXTRACTION W/PHACO  06/25/2012   Procedure: CATARACT EXTRACTION PHACO AND INTRAOCULAR LENS PLACEMENT ([place]);  Surgeon: Elta Guadeloupe T. Gershon Crane, MD;  Location: [facility];  Service: Ophthalmology;  Laterality: Left;  CDE=19.01   CATARACT EXTRACTION W/PHACO  07/09/2012   Procedure: CATARACT EXTRACTION PHACO AND INTRAOCULAR LENS PLACEMENT ([facility]);  Surgeon: Elta Guadeloupe T.  Gershon Crane, MD;  Location: [facility];  Service: Ophthalmology;  Laterality: Right;  CDE: 20.09   COLONOSCOPY WITH PROPOFOL N/A 04/23/2018   Procedure: COLONOSCOPY WITH PROPOFOL;  Surgeon: Danie Binder, MD;  [facility];  Service: Endoscopy;  Laterality: N/A;   ESOPHAGOGASTRODUODENOSCOPY (EGD) WITH PROPOFOL N/A 04/23/2018   Procedure: ESOPHAGOGASTRODUODENOSCOPY (EGD) WITH PROPOFOL;  Surgeon: Danie Binder, MD;  Location: [facility];  Service: Endoscopy;  Laterality: N/A;   Current Outpatient Medications on File Prior to Visit  Medication Sig Dispense Refill   acetaminophen (TYLENOL) 500 MG tablet Take 500 mg by mouth every 6 (six) hours as needed for headache.     albuterol (PROVENTIL) (2.5 MG/3ML) 0.083% nebulizer solution Take 3 mLs (2.5 mg total) by nebulization every 6 (six) hours as needed for wheezing or shortness of breath. 360 mL 4   budesonide-formoterol (SYMBICORT) 160-4.5 MCG/ACT inhaler Inhale 2 puffs into the lungs 2 (two) times daily. 1 each 11   cloNIDine (CATAPRES) 0.1 MG tablet Take 1 tablet (0.1 mg total) by mouth 2 (two) times daily. 60 tablet 3   ELIQUIS 5 MG TABS tablet Take 1 tablet by mouth twice daily 60 tablet 11   furosemide (LASIX) 40 MG tablet TAKE 1 TABLET BY MOUTH DAILY 30 tablet 3   gabapentin (NEURONTIN) 100 MG capsule TAKE 1 CAPSULE BY MOUTH THREE TIMES DAILY 90 capsule 0   insulin glargine (LANTUS SOLOSTAR) 100 UNIT/ML Solostar Pen Inject 45 Units into the skin at bedtime. (Patient taking differently: Inject 60 Units into the skin at  bedtime.) 15 mL 11   ipratropium-albuterol (DUONEB) 0.5-2.5 (3) MG/3ML SOLN Take 3 mLs by nebulization every 6 (six) hours as needed. 360 mL 1   Lancets (ONETOUCH DELICA PLUS [id number]) MISC USE TO CHECK BLOOD SUGAR TWICE DAILY AS DIRECTED 100 each 0   Lidocaine (HM LIDOCAINE PATCH) 4 % PTCH Apply 1 patch topically daily. 1 patch 3   losartan (COZAAR) 25 MG tablet Take 1 tablet (25 mg total) by mouth daily. 90 tablet 3   metoprolol tartrate (LOPRESSOR) 25 MG tablet TAKE 1 TABLET BY MOUTH TWICE DAILY 60 tablet 3   ONETOUCH ULTRA test strip CHECK FASTING BLOOD SUGAR TWICE DAILY 50 strip 3   OXYGEN Inhale 3 L into the lungs continuous.      potassium chloride SA (KLOR-CON) 20 MEQ tablet Take 1 tablet by mouth once daily 90 tablet 0   pravastatin (PRAVACHOL) 80 MG tablet Take 1 tablet (80 mg total) by mouth at bedtime. 30 tablet 3   predniSONE (DELTASONE) 20 MG tablet 3 tabs poqday 1-2, 2 tabs poqday 3-4, 1 tab poqday 5-6 12 tablet 0   predniSONE (DELTASONE) 20 MG tablet 3 tabs poqday 1-2, 2 tabs poqday 3-4, 1 tab poqday 5-6 12 tablet 0   pregabalin (LYRICA) 100 MG capsule Take 1 capsule (100 mg total) by mouth 2 (two) times daily. 60 capsule 3   tamsulosin (FLOMAX) 0.4 MG CAPS capsule Take 1 capsule (0.4 mg total) by mouth daily. 30 capsule 3   traMADol (ULTRAM) 50 MG tablet Take 1 tablet (50 mg total) by mouth every 12 (twelve) hours as needed. 60 tablet 0   No current facility-administered medications on file prior to visit.   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  Tobacco Use   Smoking status: Former     Packs/day: 1.50    Years: 60.00    Pack years: 90.00    Types: Cigarettes    Quit date: 12/31/2012    Years since quitting: 8.6   Smokeless tobacco: Never  Vaping Use   Vaping Use: Never used  Substance and Sexual Activity   Alcohol use: No   Drug use: No   Sexual activity: Yes    Birth control/protection: None  Other Topics Concern   Not on file  Social History Narrative   Not on file   Social Determinants of Health   Financial Resource Strain: Not on file  Food Insecurity: Not on file  Transportation Needs: Not on file  Physical Activity: Not on file  Stress: Not on file  Social Connections: Not on file  Intimate Partner Violence: Not on file     Review of Systems     Objective:   Physical Exam Vitals reviewed.  Constitutional:      Appearance: He is obese. He is not ill-appearing (Patient is wearing his pants backward.  Smells of urine.) or toxic-appearing.  HENT:     Mouth/Throat:     Mouth: Mucous membranes are dry.  Cardiovascular:     Rate and Rhythm: Normal rate. Rhythm irregular.     Heart sounds: Normal heart sounds.  Pulmonary:     Effort: Pulmonary effort is normal. No respiratory distress.     Breath sounds: Decreased air movement present. No stridor. Decreased breath sounds present. No wheezing or rhonchi.  Abdominal:     General: Abdomen is flat. There is no distension.     Palpations: Abdomen is soft.     Tenderness: There is no abdominal tenderness. There is no guarding.  Musculoskeletal:     Cervical back: Neck supple.     Right lower leg: No edema.     Left lower leg: No edema.  Lymphadenopathy:     Cervical: No cervical adenopathy.  Neurological:     Mental Status: He is alert.          Assessment & Plan:  Uncontrolled type 2 diabetes mellitus with hyperglycemia ([name])  Non compliance w medication regimen  Stage 3b chronic kidney disease ([name]) Due to his chronic kidney disease, he cannot safely take metformin so I encouraged  him to stop it.  I will increase his insulin 60 units a day.  I have asked him to also start pioglitazone 30 mg daily and we will plan on rechecking his A1c in 3 months.  I gave the patient samples of Eliquis today for his atrial fibrillation 5 mg twice daily to get him through the rest of the year.  I encouraged the patient to stop his metformin due to his chronic kidney disease.

## 2021-09-06 LAB — LIPID PANEL
Cholesterol: 224 mg/dL — ABNORMAL HIGH (ref <200)
HDL: 45 mg/dL (ref >=40)
Non-HDL Cholesterol (Calc): 179 mg/dL (calc) — ABNORMAL HIGH (ref <130)
Total CHOL/HDL Ratio: 5 (calc) — ABNORMAL HIGH (ref <5.0)
Triglycerides: 516 mg/dL — ABNORMAL HIGH (ref <150)

## 2021-09-06 LAB — COMPLETE METABOLIC PANEL WITH GFR
AG Ratio: 1.3 (calc) (ref 1.0–2.5)
ALT: 9 U/L (ref 9–46)
AST: 10 U/L (ref 10–35)
Albumin: 3.7 g/dL (ref 3.6–5.1)
Alkaline phosphatase (APISO): 85 U/L (ref 35–144)
BUN/Creatinine Ratio: 41 (calc) — ABNORMAL HIGH (ref 6–22)
BUN: 49 mg/dL — ABNORMAL HIGH (ref 7–25)
CO2: 30 mmol/L (ref 20–32)
Calcium: 8.6 mg/dL (ref 8.6–10.3)
Chloride: 90 mmol/L — ABNORMAL LOW (ref 98–110)
Creat: 1.2 mg/dL (ref 0.70–1.22)
Globulin: 2.8 g/dL (calc) (ref 1.9–3.7)
Glucose, Bld: 545 mg/dL (ref 65–99)
Potassium: 4.8 mmol/L (ref 3.5–5.3)
Sodium: 130 mmol/L — ABNORMAL LOW (ref 135–146)
Total Bilirubin: 0.2 mg/dL (ref 0.2–1.2)
Total Protein: 6.5 g/dL (ref 6.1–8.1)
eGFR: 60 mL/min/{1.73_m2} (ref >=60)

## 2021-09-06 LAB — HEMOGLOBIN A1C: Hgb A1c MFr Bld: >14 % of total Hgb — ABNORMAL HIGH (ref <5.7)

## 2021-09-08 ENCOUNTER — Other Ambulatory Visit: Payer: Self-pay

## 2021-09-08 DIAGNOSIS — E1165 Type 2 diabetes mellitus with hyperglycemia: Secondary | ICD-10-CM

## 2021-09-15 ENCOUNTER — Other Ambulatory Visit: Payer: Self-pay | Admitting: Family Medicine

## 2021-09-15 DIAGNOSIS — J441 Chronic obstructive pulmonary disease with (acute) exacerbation: Secondary | ICD-10-CM

## 2021-09-15 MED ORDER — TRAMADOL HCL 50 MG PO TABS: 50.0000 mg | ORAL_TABLET | Freq: Two times a day (BID) | ORAL | 0 refills | Status: DC | PRN

## 2021-09-15 NOTE — Telephone Encounter (Signed)
LOV 09/05/21 Last refill 08/18/21, #60, 0 refills  Please review, thanks!

## 2021-09-15 NOTE — Telephone Encounter (Signed)
Received call from patient's son [name] to request refill of  traMADol (ULTRAM) 50 MG tablet [[id number]]   Patient has a couple of pills left.   Pharmacy confirmed as  [facility] [address], [place] - [place] [address]  [address] [place], [place] [address]  Phone:  [phone]  Fax:  [phone]   Please advise at [phone].

## 2021-09-27 ENCOUNTER — Telehealth: Payer: Self-pay

## 2021-09-27 NOTE — Telephone Encounter (Signed)
Pt's son called in asking for a refill of pt's pregabalin (LYRICA) 100 MG capsule, sent to pharmacy. Please advise.  Cb#: [phone]

## 2021-09-27 NOTE — Telephone Encounter (Signed)
Spoke with pt's son and explained refills. He has not called pharmacy, advised he needed to contact them to have refill ready for pick up. He voiced understanding. Nothing further needed at this time.

## 2021-09-27 NOTE — Telephone Encounter (Signed)
ATC pt's son, no answer and no vm set up  Refills sent 08/02/21 for 4 months, pt should not need refills at this time.

## 2021-09-29 ENCOUNTER — Ambulatory Visit: Payer: Medicare Other | Admitting: Nurse Practitioner

## 2021-09-29 ENCOUNTER — Telehealth: Payer: Self-pay | Admitting: Family Medicine

## 2021-09-29 NOTE — Patient Instructions (Incomplete)

## 2021-09-29 NOTE — Telephone Encounter (Signed)
Left message for patient to call back and schedule Medicare Annual Wellness Visit (AWV) in office.  ° °If not able to come in office, please offer to do virtually or by telephone.  Left office number and my jabber #[phone]. ° °Due for AWVI ° °Please schedule at anytime with Nurse Health Advisor. °  °

## 2021-10-02 DIAGNOSIS — J449 Chronic obstructive pulmonary disease, unspecified: Secondary | ICD-10-CM | POA: Diagnosis not present

## 2021-10-12 ENCOUNTER — Telehealth: Payer: Self-pay | Admitting: Family Medicine

## 2021-10-12 NOTE — Telephone Encounter (Signed)
I attempted to leave  message for patient to call back and schedule Medicare Annual Wellness Visit (AWV) in office. No voice mail.  If not able to come in office, please offer to do virtually or by telephone.  Left office number and my jabber #[phone].  Due for AWVI   Please schedule at anytime with Nurse Health Advisor.   

## 2021-10-14 ENCOUNTER — Other Ambulatory Visit: Payer: Self-pay | Admitting: Family Medicine

## 2021-10-14 DIAGNOSIS — J441 Chronic obstructive pulmonary disease with (acute) exacerbation: Secondary | ICD-10-CM

## 2021-10-14 MED ORDER — TRAMADOL HCL 50 MG PO TABS: 50.0000 mg | ORAL_TABLET | Freq: Two times a day (BID) | ORAL | 0 refills | Status: DC | PRN

## 2021-10-14 NOTE — Telephone Encounter (Signed)
Received call from patient's son [name] to request refill of traMADol (ULTRAM) 50 MG tablet [[id number]]   Pharmacy confirmed as   [facility] [address], [place] - [place] [address]  [address] [place], [place] [address]  Phone:  [phone]  Fax:  [phone]   Patient only has one pill left.   Please advise at [phone]

## 2021-10-14 NOTE — Telephone Encounter (Signed)
LOV 09/05/21 Last refill 09/15/21  Please review, thanks!

## 2021-10-18 ENCOUNTER — Other Ambulatory Visit: Payer: Self-pay | Admitting: Family Medicine

## 2021-10-18 DIAGNOSIS — I10 Essential (primary) hypertension: Secondary | ICD-10-CM

## 2021-11-02 DIAGNOSIS — J449 Chronic obstructive pulmonary disease, unspecified: Secondary | ICD-10-CM | POA: Diagnosis not present

## 2021-11-07 ENCOUNTER — Telehealth: Payer: Self-pay | Admitting: Family Medicine

## 2021-11-07 NOTE — Telephone Encounter (Signed)
I attempted to leave message for patient to call back and schedule Medicare Annual Wellness Visit (AWV) in office. No voice mail.  If not able to come in office, please offer to do virtually or by telephone.  Left office number and my jabber [phone].  AWVI eligible as of 10/02/2009  Please schedule at anytime with Nurse Health Advisor.

## 2021-11-14 ENCOUNTER — Telehealth: Payer: Self-pay | Admitting: Family Medicine

## 2021-11-14 DIAGNOSIS — J441 Chronic obstructive pulmonary disease with (acute) exacerbation: Secondary | ICD-10-CM

## 2021-11-14 MED ORDER — TRAMADOL HCL 50 MG PO TABS: 50.0000 mg | ORAL_TABLET | Freq: Two times a day (BID) | ORAL | 0 refills | Status: DC | PRN

## 2021-11-14 NOTE — Telephone Encounter (Signed)
Received call from patient's son [name] to request refill of traMADol (ULTRAM) 50 MG tablet [[id number]]   Pharmacy confirmed as  [place], [place] [address]  Please advise at [phone].

## 2021-11-14 NOTE — Telephone Encounter (Signed)
Last OV was DEC 2022

## 2021-11-22 ENCOUNTER — Other Ambulatory Visit: Payer: Self-pay | Admitting: Family Medicine

## 2021-11-22 MED ORDER — PREGABALIN 100 MG PO CAPS: 100.0000 mg | ORAL_CAPSULE | Freq: Two times a day (BID) | ORAL | 3 refills | Status: DC

## 2021-11-22 NOTE — Telephone Encounter (Signed)
LOV 09/05/21 Last refill 08/02/21, #60, 3 refills  Please review, thanks!

## 2021-11-22 NOTE — Telephone Encounter (Signed)
Patient's son [name] called to request refill of pregabalin (LYRICA) 100 MG capsule  Pharmacy confirmed as  [place], [place] - [address]  [address] [place], [place] [address]  Phone:  [phone]  Fax:  [phone]   Please advise at [phone].

## 2021-11-23 ENCOUNTER — Other Ambulatory Visit: Payer: Self-pay

## 2021-11-23 MED ORDER — LANTUS SOLOSTAR 100 UNIT/ML ~~LOC~~ SOPN: 45.0000 [IU] | PEN_INJECTOR | Freq: Every day | SUBCUTANEOUS | 11 refills | Status: DC

## 2021-11-24 ENCOUNTER — Telehealth: Payer: Self-pay | Admitting: Family Medicine

## 2021-11-24 NOTE — Telephone Encounter (Signed)
I attempted to leave  message for patient to call back and schedule Medicare Annual Wellness Visit (AWV) in office. No voice mail.  If not able to come in office, please offer to do virtually or by telephone.  Left office number and my jabber #[phone].  Due for AWVI   Please schedule at anytime with Nurse Health Advisor.   

## 2021-11-25 ENCOUNTER — Telehealth: Payer: Self-pay | Admitting: Pharmacist

## 2021-11-25 NOTE — Progress Notes (Signed)
Chronic Care Management Pharmacy Assistant   Name: Edward [name]  MRN: [id number] DOB: Mar 31, 1935   Reason for Encounter: General Adherence Call    Recent office visits:  09/05/2021 OV (PCP) Susy Frizzle, MD; Due to his chronic kidney disease, he cannot safely take metformin so I encouraged him to stop it.  I will increase his insulin 60 units a day.  I have asked him to also start pioglitazone 30 mg daily and we will plan on rechecking his A1c in 3 months.  I gave the patient samples of Eliquis today for his atrial fibrillation 5 mg twice daily to get him through the rest of the year.  I encouraged the patient to stop his metformin due to his chronic kidney disease.  Recent consult visits:  None  Hospital visits:  None in previous 6 months  Medications: Outpatient Encounter Medications as of 11/25/2021  Medication Sig   acetaminophen (TYLENOL) 500 MG tablet Take 500 mg by mouth every 6 (six) hours as needed for headache.   albuterol (PROVENTIL) (2.5 MG/3ML) 0.083% nebulizer solution INHALE 3ML BY NEBULIZATION ROUTE EVERY 6 HOURS AS NEEDED FOR WHEEZING OR SHORTNESS OF BREATH   budesonide-formoterol (SYMBICORT) 160-4.5 MCG/ACT inhaler Inhale 2 puffs into the lungs 2 (two) times daily.   cloNIDine (CATAPRES) 0.1 MG tablet TAKE 1 TABLET BY MOUTH TWICE DAILY   ELIQUIS 5 MG TABS tablet Take 1 tablet by mouth twice daily (Patient not taking: Reported on 09/05/2021)   furosemide (LASIX) 40 MG tablet TAKE 1 TABLET BY MOUTH DAILY   gabapentin (NEURONTIN) 100 MG capsule TAKE 1 CAPSULE BY MOUTH THREE TIMES DAILY   insulin glargine (LANTUS SOLOSTAR) 100 UNIT/ML Solostar Pen Inject 45 Units into the skin at bedtime.   ipratropium-albuterol (DUONEB) 0.5-2.5 (3) MG/3ML SOLN Take 3 mLs by nebulization every 6 (six) hours as needed.   Lancets (ONETOUCH DELICA PLUS [id number]) MISC USE TO CHECK BLOOD SUGAR TWICE DAILY AS DIRECTED   Lidocaine (HM LIDOCAINE PATCH) 4 % PTCH Apply 1 patch topically  daily.   losartan (COZAAR) 25 MG tablet Take 1 tablet (25 mg total) by mouth daily.   metoprolol tartrate (LOPRESSOR) 25 MG tablet TAKE 1 TABLET BY MOUTH TWICE DAILY   ONETOUCH ULTRA test strip CHECK FASTING BLOOD SUGAR TWICE DAILY   OXYGEN Inhale 3 L into the lungs continuous.    pioglitazone (ACTOS) 30 MG tablet Take 1 tablet (30 mg total) by mouth daily.   potassium chloride SA (KLOR-CON) 20 MEQ tablet Take 1 tablet by mouth once daily   pravastatin (PRAVACHOL) 80 MG tablet TAKE 1 TABLET BY MOUTH AT BEDTIME   pregabalin (LYRICA) 100 MG capsule Take 1 capsule (100 mg total) by mouth 2 (two) times daily.   tamsulosin (FLOMAX) 0.4 MG CAPS capsule Take 1 capsule (0.4 mg total) by mouth daily.   traMADol (ULTRAM) 50 MG tablet Take 1 tablet (50 mg total) by mouth every 12 (twelve) hours as needed.   No facility-administered encounter medications on file as of 11/25/2021.   Patient Questions: Have you had any problems recently with your health? Patient states he hasn't had any problems recently with his health.  Have you had any problems with your pharmacy? Patient states he hasn't had any problems recently with his pharmacy.  What issues or side effects are you having with your medications? Patient states he lost his "box" for Symbicort and requests for me to call [facility] and let them know he needs a refill on said  medication.  -I spoke with [facility] and they will get this Rx ready to be filled today.  What would you like me to pass along to Leata Mouse, CPP for him to help you with?  Patient does not have anything for me to pass along at this time.  Patient states he is doing well with all of his medications and does not have any concerns at this time.  What can we do to take care of you better? Patient did not have any suggestions. He is happy with his current level of care.   Care Gaps: Medicare Annual Wellness: Overdue Ophthalmology Exam: Overdue since 05/14/2013 Foot Exam: Overdue  since 10/12/2020 Hemoglobin A1C: >14.0 on 09/05/2021 Colonoscopy: Completed 04/23/2018  No future appointments.   Star Rating Drugs: Insulin Glargine Solostar 100u/mL last filled 11/10/2021 33 DS Semglee 100u/mL last filled 11/23/2021 33 DS Losartan last filled 05/09/2021 90 DS Pioglitazone 30 mg last filled 09/05/2021 90 DS Pravastatin 80 mg last filled 11/10/2021 30 DS  April D Calhoun, [place] Pharmacist Assistant [phone]

## 2021-11-30 DIAGNOSIS — J449 Chronic obstructive pulmonary disease, unspecified: Secondary | ICD-10-CM | POA: Diagnosis not present

## 2021-12-07 ENCOUNTER — Telehealth: Payer: Self-pay

## 2021-12-07 NOTE — Telephone Encounter (Signed)
Per Felix Pacini is not on patient's formulary, they did provide a limited supply to the patient.  ?I also do not see it on patient's current or historical med list. It is mentioned in your recent visit note.  ? ?Please advise, thanks! ?

## 2021-12-08 ENCOUNTER — Other Ambulatory Visit: Payer: Self-pay | Admitting: Family Medicine

## 2021-12-08 DIAGNOSIS — Z09 Encounter for follow-up examination after completed treatment for conditions other than malignant neoplasm: Secondary | ICD-10-CM

## 2021-12-08 DIAGNOSIS — R600 Localized edema: Secondary | ICD-10-CM

## 2021-12-08 DIAGNOSIS — I5032 Chronic diastolic (congestive) heart failure: Secondary | ICD-10-CM

## 2021-12-08 NOTE — Telephone Encounter (Signed)
Spoke with Richardson Landry at [facility]. He states they dispensed Semglee because it is cheaper on patient's insurance than the Lantus. I do not show we gave approval for this change.  ? ?Any thoughts? Thanks! ?

## 2021-12-08 NOTE — Telephone Encounter (Signed)
Spoke with patient's son, Edward [name], and advised of prescriptions and how to use them - Semglee and Lantus.Marland Kitchen He is aware to only use ONE of these at a time. I advised he can continue to use what he has on hand until it is gone, then move to the next one and so on. Lantus should be coming to patient's home soon. Explained Patient Assistance program and approval. Son voiced understanding and is aware he can call with any questions. Nothing further needed at this time.  ? ?

## 2021-12-08 NOTE — Telephone Encounter (Signed)
He was approved for [employer] for his Lantus.  He should not need the Semglee filled I don't think.Marland KitchenMarland KitchenMarland KitchenProbably need to make sure he is not taking both!

## 2021-12-08 NOTE — Telephone Encounter (Signed)
ATC patient on m#, no answer and no voicemail set up ?ATC patient on h#, recording says call cannot be completed at this time ? ?WCB ?

## 2021-12-08 NOTE — Telephone Encounter (Signed)
Yeah me either.  I just got notice form [name] cares he was approved so Lantus may start showing up at his door.  We just need to make sure that he knows not to use both!!!

## 2021-12-09 ENCOUNTER — Other Ambulatory Visit: Payer: Self-pay

## 2021-12-09 DIAGNOSIS — J441 Chronic obstructive pulmonary disease with (acute) exacerbation: Secondary | ICD-10-CM

## 2021-12-09 NOTE — Telephone Encounter (Signed)
LOV 09/05/21 ?Last refill 11/14/21, #60, 0 refills ? ?Please review, thanks! ? ?

## 2021-12-09 NOTE — Telephone Encounter (Signed)
Pt's son called in requesting a refill of traMADol (ULTRAM) 50 MG tablet. Please advise. ? ?Cb#: [phone] ? ?

## 2021-12-12 ENCOUNTER — Telehealth: Payer: Self-pay | Admitting: Family Medicine

## 2021-12-12 MED ORDER — TRAMADOL HCL 50 MG PO TABS: 50.0000 mg | ORAL_TABLET | Freq: Two times a day (BID) | ORAL | 0 refills | Status: DC | PRN

## 2021-12-12 NOTE — Telephone Encounter (Signed)
I attempted to leave message for patient to call back and schedule Medicare Annual Wellness Visit (AWV) in office. No voice mail. ? ?If not able to come in office, please offer to do virtually or by telephone.  Left office number and my jabber [phone]. ? ?AWVI eligible as of  10/02/2009  ? ?Please schedule at anytime with Nurse Health Advisor. ?  ?

## 2021-12-16 ENCOUNTER — Telehealth: Payer: Self-pay

## 2021-12-16 ENCOUNTER — Other Ambulatory Visit: Payer: Self-pay

## 2021-12-16 ENCOUNTER — Other Ambulatory Visit: Payer: Self-pay | Admitting: Family Medicine

## 2021-12-16 ENCOUNTER — Ambulatory Visit (INDEPENDENT_AMBULATORY_CARE_PROVIDER_SITE_OTHER): Payer: Medicare Other

## 2021-12-16 VITALS — BP 130/70 | HR 65 | Ht 65.0 in | Wt 155.4 lb

## 2021-12-16 DIAGNOSIS — Z0000 Encounter for general adult medical examination without abnormal findings: Secondary | ICD-10-CM

## 2021-12-16 MED ORDER — PREGABALIN 100 MG PO CAPS: 100.0000 mg | ORAL_CAPSULE | Freq: Two times a day (BID) | ORAL | 3 refills | Status: DC

## 2021-12-16 NOTE — Progress Notes (Signed)
? ?Subjective:  ? Edward [name] is a 87 y.o. male who presents for an Initial Medicare Annual Wellness Visit. ? ?Review of Systems    ? ?Cardiac Risk Factors include: advanced age (>62mn, >>18women);diabetes mellitus;hypertension;dyslipidemia;male gender;sedentary lifestyle;Other (see comment), Risk factor comments: COPD, Afib, CHF ? ?   ?Objective:  ?  ?Today's Vitals  ? 12/16/21 0810  ?BP: 130/70  ?Pulse: 65  ?SpO2: 99%  ?Weight: 155 lb 6.4 oz (70.5 kg)  ?Height: '5\' 5"'$  (1.651 m)  ? ?Body mass index is 25.86 kg/m?. ? ?Advanced Directives 12/16/2021 03/18/2021 12/19/2020 04/19/2020 04/14/2020 03/31/2020 03/24/2020  ?Does Patient Have a Medical Advance Directive? No No No - No No No  ?Would patient like information on creating a medical advance directive? No - Patient declined - - No - Patient declined - No - Patient declined No - Patient declined  ?Pre-existing out of facility DNR order (yellow form or pink MOST form) - - - - - - -  ? ? ?Current Medications (verified) ?Outpatient Encounter Medications as of 12/16/2021  ?Medication Sig  ? acetaminophen (TYLENOL) 500 MG tablet Take 500 mg by mouth every 6 (six) hours as needed for headache.  ? albuterol (PROVENTIL) (2.5 MG/3ML) 0.083% nebulizer solution INHALE 3ML BY NEBULIZATION ROUTE EVERY 6 HOURS AS NEEDED FOR WHEEZING OR SHORTNESS OF BREATH  ? budesonide-formoterol (SYMBICORT) 160-4.5 MCG/ACT inhaler Inhale 2 puffs into the lungs 2 (two) times daily.  ? cloNIDine (CATAPRES) 0.1 MG tablet TAKE 1 TABLET BY MOUTH TWICE DAILY  ? ELIQUIS 5 MG TABS tablet Take 1 tablet by mouth twice daily  ? furosemide (LASIX) 40 MG tablet TAKE 1 TABLET BY MOUTH DAILY  ? gabapentin (NEURONTIN) 100 MG capsule TAKE 1 CAPSULE BY MOUTH THREE TIMES DAILY  ? insulin glargine (LANTUS SOLOSTAR) 100 UNIT/ML Solostar Pen Inject 45 Units into the skin at bedtime.  ? ipratropium-albuterol (DUONEB) 0.5-2.5 (3) MG/3ML SOLN Take 3 mLs by nebulization every 6 (six) hours as needed.  ? Lancets (ONETOUCH  DELICA PLUS [id number] MISC USE TO CHECK BLOOD SUGAR TWICE DAILY AS DIRECTED  ? Lidocaine (HM LIDOCAINE PATCH) 4 % PTCH Apply 1 patch topically daily.  ? losartan (COZAAR) 25 MG tablet Take 1 tablet (25 mg total) by mouth daily.  ? metoprolol tartrate (LOPRESSOR) 25 MG tablet TAKE 1 TABLET BY MOUTH TWICE DAILY  ? ONETOUCH ULTRA test strip CHECK FASTING BLOOD SUGAR TWICE DAILY  ? OXYGEN Inhale 3 L into the lungs continuous.   ? pioglitazone (ACTOS) 30 MG tablet Take 1 tablet (30 mg total) by mouth daily.  ? potassium chloride SA (KLOR-CON) 20 MEQ tablet Take 1 tablet by mouth once daily  ? pravastatin (PRAVACHOL) 80 MG tablet TAKE 1 TABLET BY MOUTH AT BEDTIME  ? pregabalin (LYRICA) 100 MG capsule Take 1 capsule (100 mg total) by mouth 2 (two) times daily.  ? tamsulosin (FLOMAX) 0.4 MG CAPS capsule Take 1 capsule (0.4 mg total) by mouth daily.  ? traMADol (ULTRAM) 50 MG tablet Take 1 tablet (50 mg total) by mouth every 12 (twelve) hours as needed.  ? [DISCONTINUED] insulin glargine-yfgn (SEMGLEE) 100 UNIT/ML Pen Inject into the skin.  ? ?No facility-administered encounter medications on file as of 12/16/2021.  ? ? ?Allergies (verified) ?Ace inhibitors  ? ?History: ?Past Medical History:  ?Diagnosis Date  ? Allergy   ? Rhinitis  ? Atrial fibrillation ([place]   ? Bronchitis   ? Chronic respiratory failure ([place]   ? Colon polyps   ? COPD (  chronic obstructive pulmonary disease) ([place])   ? Diabetes mellitus   ? Elevated lipids   ? Hypercholesterolemia   ? Hypertension   ? Iron deficiency anemia due to chronic blood loss 04/30/2018  ? Noncompliance   ? On home O2   ? 2L N/C   ? PSA elevation   ? Pulmonary fibrosis ([place])   ? Vitamin D deficiency   ? ?Past Surgical History:  ?Procedure Laterality Date  ? BIOPSY  04/23/2018  ? Procedure: BIOPSY;  Surgeon: Danie Binder, MD;  [facility];  Service: Endoscopy;;  duodenum ?gastric  ? CATARACT EXTRACTION W/PHACO  06/25/2012  ? Procedure: CATARACT EXTRACTION PHACO AND  INTRAOCULAR LENS PLACEMENT (IOC);  Surgeon: Elta Guadeloupe T. Gershon Crane, MD;  Location: [facility];  Service: Ophthalmology;  Laterality: Left;  CDE=19.01  ? CATARACT EXTRACTION W/PHACO  07/09/2012  ? Procedure: CATARACT EXTRACTION PHACO AND INTRAOCULAR LENS PLACEMENT (IOC);  Surgeon: Elta Guadeloupe T. Gershon Crane, MD;  Location: [facility];  Service: Ophthalmology;  Laterality: Right;  CDE: 20.09  ? COLONOSCOPY WITH PROPOFOL N/A 04/23/2018  ? Procedure: COLONOSCOPY WITH PROPOFOL;  Surgeon: Danie Binder, MD;  [facility];  Service: Endoscopy;  Laterality: N/A;  ? ESOPHAGOGASTRODUODENOSCOPY (EGD) WITH PROPOFOL N/A 04/23/2018  ? Procedure: ESOPHAGOGASTRODUODENOSCOPY (EGD) WITH PROPOFOL;  Surgeon: Danie Binder, MD;  [facility];  Service: Endoscopy;  Laterality: N/A;  ? ?Family History  ?Problem Relation Age of Onset  ? Heart disease Mother   ? CAD Other   ? Diabetes Other   ? ?Social History  ? ?Socioeconomic History  ? Marital status: Married  ?  Spouse name: [name]  ? Number of children: 2  ? Years of education: Not on file  ? Highest education level: Not on file  ?Occupational History  ? Occupation: [employer]pper [employer]  ? 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 Smoking status: Former  ?  Packs/day: 1.50  ?  Years: 60.00  ?  Pack years: 90.00  ?  Types: Cigarettes  ?  Quit date: 12/31/2012  ?  Years since quitting: 8.9  ? Smokeless tobacco: Never  ?Vaping Use  ? Vaping Use: Never used  ?Substance and Sexual Activity  ? Alcohol use: No  ? Drug use: No  ? Sexual activity: Yes  ?  Birth control/protection: None  ?Other Topics Concern  ? Not on file  ?Social History Narrative  ? Lives with wife. Married 50+ years.  ? Grown twin sons.   ? ?Social Determinants of Health  ? ?Financial Resource Strain: Low Risk   ? Difficulty of Paying Living Expenses: Not hard at all  ?Food Insecurity: No Food Insecurity  ? Worried About Charity fundraiser in the Last Year: Never true  ? Ran Out of Food in the Last Year: Never true  ?Transportation  Needs: No Transportation Needs  ? Lack of Transportation (Medical): No  ? Lack of Transportation (Non-Medical): No  ?Physical Activity: Inactive  ? Days of Exercise per Week: 0 days  ? Minutes of Exercise per Session: 0 min  ?Stress: No Stress Concern Present  ? Feeling of Stress : Not at all  ?Social Connections: Socially Integrated  ? Frequency of Communication with Friends and Family: More than three times a week  ? Frequency of Social Gatherings with Friends and Family: More than three times a week  ? Attends Religious Services: More than 4 times per year  ? Active Member of Clubs or Organizations: Yes  ?  Attends Archivist Meetings: More than 4 times per year  ? Marital Status: Married  ? ? ?Tobacco Counseling ?Counseling given: Not Answered ? ? ?Clinical Intake: ? ?Pre-visit preparation completed: Yes ? ?Pain : No/denies pain ? ?  ? ?BMI - recorded: 25.86 ?Nutritional Status: BMI 25 -29 Overweight ?Nutritional Risks: None ?Diabetes: Yes ? ?How often do you need to have someone help you when you read instructions, pamphlets, or other written materials from your doctor or pharmacy?: 1 - Never ? ?Diabetic?Nutrition Risk Assessment: ? ?Has the patient had any N/V/D within the last 2 months?  Yes  ?Does the patient have any non-healing wounds?  Yes  ?Has the patient had any unintentional weight loss or weight gain?  Yes  ? ?Diabetes: ? ?Is the patient diabetic?  Yes  ?If diabetic, was a CBG obtained today?  No  ?Did the patient bring in their glucometer from home?  No   ?How often do you monitor your CBG's? weekly.  ? ?Financial Strains and Diabetes Management: ? ?Are you having any financial strains with the device, your supplies or your medication? No .  ?Does the patient want to be seen by Chronic Care Management for management of their diabetes?  No  ?Would the patient like to be referred to a Nutritionist or for Diabetic Management?  No  ? ?Diabetic Exams: ? ?Diabetic Eye Exam: Completed 05/14/2012.   Pt has been advised about the importance in completing this exam.  ?Diabetic Foot Exam: Completed 10/13/2019. Pt has been advised about the importance in completing this exam. ? ?Interpreter Needed?: No ? ?Informat

## 2021-12-16 NOTE — Patient Instructions (Signed)
Edward [name] , ?Thank you for taking time to come for your Medicare Wellness Visit. I appreciate your ongoing commitment to your health goals. Please review the following plan we discussed and let me know if I can assist you in the future.  ? ?Screening recommendations/referrals: ?Colonoscopy: No longer required due to age.  ? ?Recommended yearly ophthalmology/optometry visit for glaucoma screening and checkup ?Recommended yearly dental visit for hygiene and checkup ? ?Vaccinations: ?Influenza vaccine: Declined.  ?Pneumococcal vaccine: Done 10/23/2013 and 03/02/2002 ?Tdap vaccine: Done 06/02/2004 Repeat in 10 years ? ?Shingles vaccine: Discussed.   ?Covid-19: Declined. ? ?Advanced directives: Advance directive discussed with you today. Even though you declined this today, please call our office should you change your mind, and we can give you the proper paperwork for you to fill out. ? ? ?Conditions/risks identified: Aim for 30 minutes of exercise or brisk walking, 6-8 glasses of water, and 5 servings of fruits and vegetables each day. ? ? ?Next appointment: Follow up in one year for your annual wellness visit. 2024.  ? ?Preventive Care 93 Years and Older, Male ? ?Preventive care refers to lifestyle choices and visits with your health care provider that can promote health and wellness. ?What does preventive care include? ?A yearly physical exam. This is also called an annual well check. ?Dental exams once or twice a year. ?Routine eye exams. Ask your health care provider how often you should have your eyes checked. ?Personal lifestyle choices, including: ?Daily care of your teeth and gums. ?Regular physical activity. ?Eating a healthy diet. ?Avoiding tobacco and drug use. ?Limiting alcohol use. ?Practicing safe sex. ?Taking low doses of aspirin every day. ?Taking vitamin and mineral supplements as recommended by your health care provider. ?What happens during an annual well check? ?The services and screenings done by  your health care provider during your annual well check will depend on your age, overall health, lifestyle risk factors, and family history of disease. ?Counseling  ?Your health care provider may ask you questions about your: ?Alcohol use. ?Tobacco use. ?Drug use. ?Emotional well-being. ?Home and relationship well-being. ?Sexual activity. ?Eating habits. ?History of falls. ?Memory and ability to understand (cognition). ?Work and work Statistician. ?Screening  ?You may have the following tests or measurements: ?Height, weight, and BMI. ?Blood pressure. ?Lipid and cholesterol levels. These may be checked every 5 years, or more frequently if you are over 36 years old. ?Skin check. ?Lung cancer screening. You may have this screening every year starting at age 21 if you have a 30-pack-year history of smoking and currently smoke or have quit within the past 15 years. ?Fecal occult blood test (FOBT) of the stool. You may have this test every year starting at age 51. ?Flexible sigmoidoscopy or colonoscopy. You may have a sigmoidoscopy every 5 years or a colonoscopy every 10 years starting at age 27. ?Prostate cancer screening. Recommendations will vary depending on your family history and other risks. ?Hepatitis C blood test. ?Hepatitis B blood test. ?Sexually transmitted disease (STD) testing. ?Diabetes screening. This is done by checking your blood sugar (glucose) after you have not eaten for a while (fasting). You may have this done every 1-3 years. ?Abdominal aortic aneurysm (AAA) screening. You may need this if you are a current or former smoker. ?Osteoporosis. You may be screened starting at age 8 if you are at high risk. ?Talk with your health care provider about your test results, treatment options, and if necessary, the need for more tests. ?Vaccines  ?Your health care  provider may recommend certain vaccines, such as: ?Influenza vaccine. This is recommended every year. ?Tetanus, diphtheria, and acellular pertussis  (Tdap, Td) vaccine. You may need a Td booster every 10 years. ?Zoster vaccine. You may need this after age 7. ?Pneumococcal 13-valent conjugate (PCV13) vaccine. One dose is recommended after age 46. ?Pneumococcal polysaccharide (PPSV23) vaccine. One dose is recommended after age 61. ?Talk to your health care provider about which screenings and vaccines you need and how often you need them. ?This information is not intended to replace advice given to you by your health care provider. Make sure you discuss any questions you have with your health care provider. ?Document Released: 10/15/2015 Document Revised: 06/07/2016 Document Reviewed: 07/20/2015 ?Elsevier Interactive Patient Education ? 2017 [place]. ? ?Fall Prevention in the Home ?Falls can cause injuries. They can happen to people of all ages. There are many things you can do to make your home safe and to help prevent falls. ?What can I do on the outside of my home? ?Regularly fix the edges of walkways and driveways and fix any cracks. ?Remove anything that might make you trip as you walk through a door, such as a raised step or threshold. ?Trim any bushes or trees on the path to your home. ?Use bright outdoor lighting. ?Clear any walking paths of anything that might make someone trip, such as rocks or tools. ?Regularly check to see if handrails are loose or broken. Make sure that both sides of any steps have handrails. ?Any raised decks and porches should have guardrails on the edges. ?Have any leaves, snow, or ice cleared regularly. ?Use sand or salt on walking paths during winter. ?Clean up any spills in your garage right away. This includes oil or grease spills. ?What can I do in the bathroom? ?Use night lights. ?Install grab bars by the toilet and in the tub and shower. Do not use towel bars as grab bars. ?Use non-skid mats or decals in the tub or shower. ?If you need to sit down in the shower, use a plastic, non-slip stool. ?Keep the floor dry. Clean  up any water that spills on the floor as soon as it happens. ?Remove soap buildup in the tub or shower regularly. ?Attach bath mats securely with double-sided non-slip rug tape. ?Do not have throw rugs and other things on the floor that can make you trip. ?What can I do in the bedroom? ?Use night lights. ?Make sure that you have a light by your bed that is easy to reach. ?Do not use any sheets or blankets that are too big for your bed. They should not hang down onto the floor. ?Have a firm chair that has side arms. You can use this for support while you get dressed. ?Do not have throw rugs and other things on the floor that can make you trip. ?What can I do in the kitchen? ?Clean up any spills right away. ?Avoid walking on wet floors. ?Keep items that you use a lot in easy-to-reach places. ?If you need to reach something above you, use a strong step stool that has a grab bar. ?Keep electrical cords out of the way. ?Do not use floor polish or wax that makes floors slippery. If you must use wax, use non-skid floor wax. ?Do not have throw rugs and other things on the floor that can make you trip. ?What can I do with my stairs? ?Do not leave any items on the stairs. ?Make sure that there are handrails on both  sides of the stairs and use them. Fix handrails that are broken or loose. Make sure that handrails are as long as the stairways. ?Check any carpeting to make sure that it is firmly attached to the stairs. Fix any carpet that is loose or worn. ?Avoid having throw rugs at the top or bottom of the stairs. If you do have throw rugs, attach them to the floor with carpet tape. ?Make sure that you have a light switch at the top of the stairs and the bottom of the stairs. If you do not have them, ask someone to add them for you. ?What else can I do to help prevent falls? ?Wear shoes that: ?Do not have high heels. ?Have rubber bottoms. ?Are comfortable and fit you well. ?Are closed at the toe. Do not wear sandals. ?If you  use a stepladder: ?Make sure that it is fully opened. Do not climb a closed stepladder. ?Make sure that both sides of the stepladder are locked into place. ?Ask someone to hold it for you, if possible. ?C

## 2021-12-16 NOTE — Telephone Encounter (Signed)
During AWV, pt states he needs a refill on Lyrica. Okay to send in? Thanks.  ?

## 2021-12-23 ENCOUNTER — Telehealth: Payer: Self-pay | Admitting: Pharmacist

## 2021-12-23 NOTE — Progress Notes (Signed)
? ? ?Chronic Care Management ?Pharmacy Assistant  ? ?Name: Edward [name]  MRN: [id number] DOB: Dec 05, 1934 ? ? ?Reason for Encounter: Disease State - General Adherence Call  ?  ? ?Recent office visits:  ?12/12/21 Annual Medicare Wellness Completed. ? ?Recent consult visits:  ?None noted.  ? ?Hospital visits: 07/07/22 - 07/08/22 ?Medication Reconciliation was completed by comparing discharge summary, patient?s EMR and Pharmacy list, and upon discussion with patient. ? ?Admitted to the hospital on 07/07/22 due to Weakness. Discharge date was 07/08/22. Discharged from [facility].   ? ?New?Medications Started at [facility] Discharge:?? ?None noted.  ? ?Medication Changes at Hospital Discharge: ?None noted.  ? ?Medications Discontinued at Hospital Discharge: ?None noted.  ? ?Medications that remain the same after Hospital Discharge:??  ?All other medications will remain the same.   ? ? ?Hospital visits: 06/27/21 ?Medication Reconciliation was completed by comparing discharge summary, patient?s EMR and Pharmacy list, and upon discussion with patient. ? ?Admitted to the hospital on 06/27/21 due to Hyperglycemia. Discharge date was 06/27/21. Discharged from [facility].   ? ?New?Medications Started at [facility] Discharge:?? ?None noted.  ? ?Medication Changes at Hospital Discharge: ?None noted.  ? ?Medications Discontinued at Hospital Discharge: ?None noted. ? ?Medications that remain the same after Hospital Discharge:??  ?All other medications will remain the same.   ? ? ?Medications: ?Outpatient Encounter Medications as of 12/23/2021  ?Medication Sig  ? acetaminophen (TYLENOL) 500 MG tablet Take 500 mg by mouth every 6 (six) hours as needed for headache.  ? albuterol (PROVENTIL) (2.5 MG/3ML) 0.083% nebulizer solution INHALE 3ML BY NEBULIZATION ROUTE EVERY 6 HOURS AS NEEDED FOR WHEEZING OR SHORTNESS OF BREATH  ? budesonide-formoterol (SYMBICORT) 160-4.5 MCG/ACT inhaler Inhale 2 puffs into the lungs 2 (two) times daily.   ? cloNIDine (CATAPRES) 0.1 MG tablet TAKE 1 TABLET BY MOUTH TWICE DAILY  ? ELIQUIS 5 MG TABS tablet Take 1 tablet by mouth twice daily  ? furosemide (LASIX) 40 MG tablet TAKE 1 TABLET BY MOUTH DAILY  ? gabapentin (NEURONTIN) 100 MG capsule TAKE 1 CAPSULE BY MOUTH THREE TIMES DAILY  ? insulin glargine (LANTUS SOLOSTAR) 100 UNIT/ML Solostar Pen Inject 45 Units into the skin at bedtime.  ? ipratropium-albuterol (DUONEB) 0.5-2.5 (3) MG/3ML SOLN Take 3 mLs by nebulization every 6 (six) hours as needed.  ? Lancets (ONETOUCH DELICA PLUS [id number]) MISC USE TO CHECK BLOOD SUGAR TWICE DAILY AS DIRECTED  ? Lidocaine (HM LIDOCAINE PATCH) 4 % PTCH Apply 1 patch topically daily.  ? losartan (COZAAR) 25 MG tablet Take 1 tablet (25 mg total) by mouth daily.  ? metoprolol tartrate (LOPRESSOR) 25 MG tablet TAKE 1 TABLET BY MOUTH TWICE DAILY  ? ONETOUCH ULTRA test strip CHECK FASTING BLOOD SUGAR TWICE DAILY  ? OXYGEN Inhale 3 L into the lungs continuous.   ? pioglitazone (ACTOS) 30 MG tablet Take 1 tablet (30 mg total) by mouth daily.  ? potassium chloride SA (KLOR-CON) 20 MEQ tablet Take 1 tablet by mouth once daily  ? pravastatin (PRAVACHOL) 80 MG tablet TAKE 1 TABLET BY MOUTH AT BEDTIME  ? pregabalin (LYRICA) 100 MG capsule Take 1 capsule (100 mg total) by mouth 2 (two) times daily.  ? tamsulosin (FLOMAX) 0.4 MG CAPS capsule Take 1 capsule (0.4 mg total) by mouth daily.  ? traMADol (ULTRAM) 50 MG tablet Take 1 tablet (50 mg total) by mouth every 12 (twelve) hours as needed.  ? ?No facility-administered encounter medications on file as of 12/23/2021.  ? ? ?  Have you had any problems recently with your health? ?Patient denied any recent problems or concerns with his health. He reported "Im doing alright". ? ?Have you had any problems with your pharmacy? ?Patient denied any problems with his current pharmacy.  ? ?What issues or side effects are you having with your medications? ?Patient denied any issues or side effects with his  current medications.  ? ?What would you like me to pass along to Leata Mouse, CPP for them to help you with?  ?Patient did not have anything to pass along to CPP at this time.  ? ?What can we do to take care of you better? ?Patient did not have any recommendation at this time. He stated he was satisfied with his current level of care.  ? ? ?Care Gaps ? ?AWV: done 12/12/21 ?Colonoscopy: done 04/23/18 ?DM Eye Exam: due 05/14/13 ?DM Foot Exam: due 10/12/20 ?Microalbumin: done 12/24/20 ?HbgAIC: done 09/05/21 (> 14.0) ?DEXA:N/A ?Mammogram: N/A ? ? ?Star Rating Drugs: ?pravastatin (PRAVACHOL) 80 MG tablet - last filled 12/08/21 30 days  ?losartan (COZAAR) 25 MG tablet - last filled 05/09/21 90 days  ? ? ?Future Appointments  ?Date Time Provider [place]  ?12/22/2022  8:15 AM [facility]-NURSE HEALTH ADVISOR [facility]  ? ? ? ?Liza Showfety, CCMA ?Clinical Pharmacist Assistant  ?[phone] ? ?

## 2021-12-31 DIAGNOSIS — J449 Chronic obstructive pulmonary disease, unspecified: Secondary | ICD-10-CM | POA: Diagnosis not present

## 2022-01-03 ENCOUNTER — Other Ambulatory Visit: Payer: Self-pay | Admitting: Family Medicine

## 2022-01-10 ENCOUNTER — Other Ambulatory Visit: Payer: Self-pay | Admitting: Family Medicine

## 2022-01-10 DIAGNOSIS — J441 Chronic obstructive pulmonary disease with (acute) exacerbation: Secondary | ICD-10-CM

## 2022-01-10 MED ORDER — TRAMADOL HCL 50 MG PO TABS: 50.0000 mg | ORAL_TABLET | Freq: Two times a day (BID) | ORAL | 0 refills | Status: DC | PRN

## 2022-01-10 NOTE — Telephone Encounter (Signed)
Received call from patient's son [name] to request refill of ? ?traMADol (ULTRAM) 50 MG tablet ? ? ?Pharmacy confirmed as ? ?[place] [place] [address]  ?[address] [place], [place] [address]  ?Phone:  [phone]  Fax:  [phone]  ? ?Please advise at [phone]. ?

## 2022-01-10 NOTE — Telephone Encounter (Signed)
LOV 09/05/21 ?Last refill 12/12/21, #60, 0 refills ? ?Please review, thanks! ? ?

## 2022-01-12 ENCOUNTER — Telehealth: Payer: Medicare Other

## 2022-01-12 NOTE — Progress Notes (Deleted)
? ?Chronic Care Management ?Pharmacy Note ? ?01/12/2022 ?Name:  Edward [name] MRN:  [id number] DOB:  14-Jul-1935 ? ?Summary: ?*** ? ?Recommendations/Changes made from today's visit: ?*** ? ?Plan: ?*** ? ? ?Subjective: ?Edward [name] is an 87 y.o. year old male who is a primary patient of [name], [name], MD.  The CCM team was consulted for assistance with disease management and care coordination needs.   ? ?{CCMTELEPHONEFACETOFACE:21091510} for {CCMINITIALFOLLOWUPCHOICE:21091511} in response to provider referral for pharmacy case management and/or care coordination services.  ? ?Consent to Services:  ?{CCMCONSENTOPTIONS:25074} ? ?Patient Care Team: ?Susy Frizzle, MD as PCP - General (Family Medicine) ?Edythe Clarity, [facility] as Pharmacist (Pharmacist) ? ?Recent office visits: ?*** ? ?Recent consult visits: ?*** ? ?Hospital visits: ?{Hospital DC Yes/No:25215} ? ? ?Objective: ? ?Lab Results  ?Component Value Date  ? CREATININE 1.20 09/05/2021  ? BUN 49 (H) 09/05/2021  ? EGFR 60 09/05/2021  ? GFRNONAA 39 (L) 07/07/2021  ? GFRAA 55 (L) 03/30/2021  ? NA 130 (L) 09/05/2021  ? K 4.8 09/05/2021  ? CALCIUM 8.6 09/05/2021  ? CO2 30 09/05/2021  ? GLUCOSE 545 (HH) 09/05/2021  ? ? ?Lab Results  ?Component Value Date/Time  ? HGBA1C >14.0 (H) 09/05/2021 08:20 AM  ? HGBA1C 12.6 (H) 03/24/2021 12:33 PM  ? HGBA1C >14.0 (H) 12/01/2015 08:31 AM  ? HGBA1C >14.0 (H) 05/27/2015 08:54 AM  ? MICROALBUR CANCELED 12/24/2020 08:29 AM  ? MICROALBUR 378.4 01/23/2020 03:09 PM  ?  ?Last diabetic Eye exam:  ?Lab Results  ?Component Value Date/Time  ? [place] 05/14/2012 12:00 AM  ?  ?Last diabetic Foot exam: No results found for: HMDIABFOOTEX  ? ?Lab Results  ?Component Value Date  ? CHOL 224 (H) 09/05/2021  ? HDL 45 09/05/2021  ? Kathleen  09/05/2021  ?   Comment:  ?   . ?LDL cholesterol not calculated. Triglyceride levels ?greater than 400 mg/dL invalidate calculated LDL results. ?. ?Reference range: <100 ?Marland Kitchen ?Desirable  range <100 mg/dL for primary prevention;   ?<70 mg/dL for patients with CHD or diabetic patients  ?with > or = 2 CHD risk factors. ?. ?LDL-C is now calculated using the Martin-Hopkins  ?calculation, which is a validated novel method providing  ?better accuracy than the Friedewald equation in the  ?estimation of LDL-C.  ?Cresenciano Genre et al. Annamaria Helling. 2060;156(15): 2061-2068  ?(http://education.[facility].com/faq/FAQ164) ?  ? TRIG 516 (H) 09/05/2021  ? CHOLHDL 5.0 (H) 09/05/2021  ? ? ? ?  Latest Ref Rng & Units 09/05/2021  ?  8:20 AM 07/07/2021  ?  6:21 PM 03/24/2021  ? 12:33 PM  ?Hepatic Function  ?Total Protein 6.1 - 8.1 g/dL 6.5   5.7   7.0    ?Albumin 3.5 - 5.0 g/dL  3.1     ?AST 10 - 35 U/L _0 ?ALT 9 - 46 U/L _1 ?Alk Phosphatase 38 - 126 U/L  67     ?Total Bilirubin 0.2 - 1.2 mg/dL 0.2   0.5   0.3    ? ? ?Lab Results  ?Component Value Date/Time  ? TSH 1.010 02/17/2018 12:45 AM  ? TSH 3.123 09/25/2017 03:12 PM  ? FREET4 0.57 (L) 02/17/2018 12:45 AM  ? ? ? ?  Latest Ref Rng & Units 07/07/2021  ?  6:21 PM 06/27/2021  ?  6:12 AM 03/24/2021  ? 12:33 PM  ?CBC  ?  WBC 4.0 - 10.5 K/uL 9.6   12.9   8.0    ?Hemoglobin 13.0 - 17.0 g/dL 11.9   12.6   12.0    ?Hematocrit 39.0 - 52.0 % 36.2   36.9   36.7    ?Platelets 150 - 400 K/uL 302   338   367    ? ? ?No results found for: VD25OH ? ?Clinical ASCVD: {YES/NO:21197} ?The ASCVD Risk score (Arnett DK, et al., 2019) failed to calculate for the following reasons: ?  The 2019 ASCVD risk score is only valid for ages 68 to 102   ? ? ?  12/16/2021  ?  8:18 AM 03/31/2020  ?  1:49 PM 02/23/2020  ? 11:47 AM  ?Depression screen PHQ 2/9  ?Decreased Interest 0 0 0  ?Down, Depressed, Hopeless 0 0 0  ?PHQ - 2 Score 0 0 0  ?Altered sleeping   0  ?Tired, decreased energy   0  ?Change in appetite   0  ?Feeling bad or failure about yourself    0  ?Trouble concentrating   0  ?Moving slowly or fidgety/restless   0  ?Suicidal thoughts   0  ?PHQ-9 Score   0  ?Difficult doing work/chores    Not difficult at all  ?  ? ?***Other: (CHADS2VASc if Afib, MMRC or CAT for COPD, ACT, DEXA) ? ?Social History  ? ?Tobacco Use  ?Smoking Status Former  ? Packs/day: 1.50  ? Years: 60.00  ? Pack years: 90.00  ? Types: Cigarettes  ? Quit date: 12/31/2012  ? Years since quitting: 9.0  ?Smokeless Tobacco Never  ? ?BP Readings from Last 3 Encounters:  ?12/16/21 130/70  ?09/05/21 128/62  ?07/15/21 (!) 118/54  ? ?Pulse Readings from Last 3 Encounters:  ?12/16/21 65  ?09/05/21 66  ?07/15/21 (!) 58  ? ?Wt Readings from Last 3 Encounters:  ?12/16/21 155 lb 6.4 oz (70.5 kg)  ?09/05/21 157 lb (71.2 kg)  ?07/15/21 159 lb (72.1 kg)  ? ?BMI Readings from Last 3 Encounters:  ?12/16/21 25.86 kg/m?  ?09/05/21 26.13 kg/m?  ?07/15/21 26.46 kg/m?  ? ? ?Assessment/Interventions: Review of patient past medical history, allergies, medications, health status, including review of consultants reports, laboratory and other test data, was performed as part of comprehensive evaluation and provision of chronic care management services.  ? ?SDOH:  (Social Determinants of Health) assessments and interventions performed: {yes/no:20286} ? ?SDOH Screenings  ? ?Alcohol Screen: Low Risk   ? Last Alcohol Screening Score (AUDIT): 0  ?Depression (PHQ2-9): Low Risk   ? PHQ-2 Score: 0  ?Financial Resource Strain: Low Risk   ? Difficulty of Paying Living Expenses: Not hard at all  ?Food Insecurity: No Food Insecurity  ? Worried About Charity fundraiser in the Last Year: Never true  ? Ran Out of Food in the Last Year: Never true  ?Housing: Low Risk   ? Last Housing Risk Score: 0  ?Physical Activity: Inactive  ? Days of Exercise per Week: 0 days  ? Minutes of Exercise per Session: 0 min  ?Social Connections: Socially Integrated  ? Frequency of Communication with Friends and Family: More than three times a week  ? Frequency of Social Gatherings with Friends and Family: More than three times a week  ? Attends Religious Services: More than 4 times per year  ?  Active Member of Clubs or Organizations: Yes  ? Attends Archivist Meetings: More than 4 times per year  ? Marital Status: Married  ?  Stress: No Stress Concern Present  ? Feeling of Stress : Not at all  ?Tobacco Use: Medium Risk  ? Smoking Tobacco Use: Former  ? Smokeless Tobacco Use: Never  ? Passive Exposure: Not on file  ?Transportation Needs: No Transportation Needs  ? Lack of Transportation (Medical): No  ? Lack of Transportation (Non-Medical): No  ? ? ?CCM Care Plan ? ?Allergies  ?Allergen Reactions  ? Ace Inhibitors Other (See Comments)  ?  Hyperkalemia--07/23/2013:patient states not familiar with the following allergy  ? ? ?Medications Reviewed Today   ? ? Reviewed by Chriss Driver, LPN (Licensed Practical Nurse) on 12/16/21 at [place] List Status: <None>  ? ?Medication Order Taking? Sig Documenting Provider Last Dose Status Informant  ?acetaminophen (TYLENOL) 500 MG tablet [id number] Yes Take 500 mg by mouth every 6 (six) hours as needed for headache. [provider] Taking Active Self  ?albuterol (PROVENTIL) (2.5 MG/3ML) 0.083% nebulizer solution [id number] Yes INHALE 3ML BY NEBULIZATION ROUTE EVERY 6 HOURS AS NEEDED FOR WHEEZING OR SHORTNESS OF Montel Clock, MD Taking Active   ?budesonide-formoterol [facility]) 160-4.5 MCG/ACT inhaler [id number] Yes Inhale 2 puffs into the lungs 2 (two) times daily. Susy Frizzle, MD Taking Active   ?cloNIDine (CATAPRES) 0.1 MG tablet [id number] Yes TAKE 1 TABLET BY MOUTH TWICE DAILY Susy Frizzle, MD Taking Active   ?ELIQUIS 5 MG TABS tablet [id number] Yes Take 1 tablet by mouth twice daily Susy Frizzle, MD Taking Active Self  ?         ?Med Note Denton Brick, COURAGE   Mon Apr 19, 2020  4:51 PM)    ?furosemide (LASIX) 40 MG tablet [id number] Yes TAKE 1 TABLET BY MOUTH DAILY Susy Frizzle, MD Taking Active   ?gabapentin (NEURONTIN) 100 MG capsule [id number] Yes TAKE 1 CAPSULE BY MOUTH THREE TIMES DAILY Susy Frizzle, MD  Taking Active   ?insulin glargine (LANTUS SOLOSTAR) 100 UNIT/ML Solostar Pen [id number] Yes Inject 45 Units into the skin at bedtime. Susy Frizzle, MD Taking Active   ?ipratropium-albuterol (DUONEB) 0.5-2

## 2022-01-13 ENCOUNTER — Ambulatory Visit (INDEPENDENT_AMBULATORY_CARE_PROVIDER_SITE_OTHER): Payer: Medicare Other | Admitting: Pharmacist

## 2022-01-13 ENCOUNTER — Telehealth: Payer: Self-pay | Admitting: Pharmacist

## 2022-01-13 DIAGNOSIS — E1140 Type 2 diabetes mellitus with diabetic neuropathy, unspecified: Secondary | ICD-10-CM

## 2022-01-13 DIAGNOSIS — I5030 Unspecified diastolic (congestive) heart failure: Secondary | ICD-10-CM

## 2022-01-13 NOTE — Patient Instructions (Addendum)
Visit Information ? ? Goals Addressed   ? ?  ?  ?  ?  ? This Visit's Progress  ?  THN-Monitor and Manage My Blood Sugar   Not on track  ?  Follow Up Date 07/29/2020 ?  ?- check blood sugar at prescribed times ?- enter blood sugar readings and medication or insulin into daily log  ?  ?Why is this important?   ?Checking your blood sugar at home helps to keep it from getting very high or very low.  ?Writing the results in a diary or log helps the doctor know how to care for you.  ?Your blood sugar log should have the time, date and the results.  ?Also, write down the amount of insulin or other medicine that you take.  ?Other information, like what you ate, exercise done and how you were feeling, will also be helpful.   ?  ?Notes: Stress the importance of logging all readings prior to his initial meal (fasting) ? ? ?  ? ?  ? ?Patient Care Plan: Diabetes Type 2 (Adult)  ?  ? ?Problem Identified: Glycemic Management (Diabetes, Type 2)   ?  ? ?Long-Range Goal: Glycemic Management Optimized   ?Start Date: 06/29/2020  ?Expected End Date: 08/30/2020  ?Priority: Medium  ?Note:   ?Evidence-based guidance:  ?Anticipate A1C testing (point-of-care) every 3 to 6 months based on goal attainment.  ?Review mutually-set A1C goal or target range.  ?Anticipate use of antihyperglycemic with or without insulin and periodic adjustments; consider active involvement of pharmacist.  ?Provide medical nutrition therapy and development of individualized eating.  ?Compare self-reported symptoms of hypo or hyperglycemia to blood glucose levels, diet and fluid intake, current medications, psychosocial and physiologic stressors, change in activity and barriers to care adherence.  ?Promote self-monitoring of blood glucose levels.  ?Assess and address barriers to management plan, such as food insecurity, age, developmental ability, depression, anxiety, fear of hypoglycemia or weight gain, as well as medication cost, side effects and complicated regimen.   ?Consider referral to community-based diabetes education program, visiting nurse, community health worker or health coach.  ?Encourage regular dental care for treatment of periodontal disease; refer to dental provider when needed.   ?Notes:  ?  ? ?Task: Alleviate Barriers to Glycemic Management   ?Due Date: 06/29/2020  ?Priority: Routine  ?Note:   ?Care Management Activities:  ?  ?- barriers to adherence to treatment plan identified ?- blood glucose monitoring encouraged ?- blood glucose readings reviewed ?- self-awareness of signs/symptoms of hypo or hyperglycemia encouraged ?- use of blood glucose monitoring log promoted  ?  ?Notes:  ?  ? ?Patient Care Plan: General Pharmacy (Adult)  ?  ? ?Problem Identified: DM, HF   ?Priority: High  ?Onset Date: 01/13/2022  ?  ? ?Long-Range Goal: Patient-Specific Goal   ?Start Date: 01/13/2022  ?Expected End Date: 07/15/2022  ?This Visit's Progress: On track  ?Priority: High  ?Note:   ?Current Barriers:  ? Unable to achieve control of glucose  ? ?Pharmacist Clinical Goal(s):  ? Patient will achieve improvement in glucose control as evidenced by A1c  through collaboration with PharmD and provider.  ? ?Interventions: ? 1:1 collaboration with Susy Frizzle, MD regarding development and update of comprehensive plan of care as evidenced by provider attestation and co-signature ? Inter-disciplinary care team collaboration (see longitudinal plan of care) ? Comprehensive medication review performed; medication list updated in electronic medical record ? ? ?Diabetes (A1c goal <8%) ?-Uncontrolled ?-Current medications: ? Lantus 100  units/ml 45 units into the skin hs Appropriate, Query effective,  ? Pioglitazone '30mg'$  Query Appropriate, , ,  ?-Medications previously tried: Jardiance?  Unknown why d/c  ?-Current home glucose readings ? fasting glucose: not checking gasting ? post prandial glucose: 300 last night ?-Denies hypoglycemic/hyperglycemic symptoms ?-Current meal patterns:   ? breakfast: breads, eggs  ? lunch: sandwiches  ? snacks: cakes, cookies ? drinks: milk, water, Sprite Zero ?-Current exercise: minimal ?-Educated on A1c and blood sugar goals; ?Complications of diabetes including kidney damage, retinal damage, and cardiovascular disease; ?Proper insulin injection technique; ?Prevention and management of hypoglycemic episodes; ?Benefits of routine self-monitoring of blood sugar; ?Counseled to check feet daily and get yearly eye exams ?-Counseled to check feet daily and get yearly eye exams ?- Patient would benefit from SGLT-2 to replace Actos.  Caution with Actos in HF.   It may cause fluid retention and exacerbate HF symptoms.  Cost will be an issue, will apply for Farxiga patient assistance before initiating treatment.  Glucose very uncontrolled and he never got in to see endocrinology. ?Recommend recheck A1c - would recommend switch from Actos to [place] if possible due to copay. ?Will have front office call to schedule A1c recheck since he has not been able to get into endocrine. ? ?Heart Failure (Goal: manage symptoms and prevent exacerbations) ?-Controlled ?-Last ejection fraction: 60-65% ?-Current treatment: ? Furosemide '40mg'$  Appropriate, Effective, Safe, Accessible ? Metoprolol tartrate '25mg'$  BID Appropriate, Effective, Safe, Accessible ? Losartan '25mg'$  Appropriate, Effective, Safe, Accessible ?-Medications previously tried: none noted  ?-Current home BP/HR readings: no logs reviewed today ?-Current dietary habits: see DM ?-Current exercise habits: minimal ?-Educated on Benefits of medications for managing symptoms and prolonging life ?Importance of weighing daily; if you gain more than 3 pounds in one day or 5 pounds in one week, contact providers ?Proper diuretic administration and potassium supplementation ?Importance of blood pressure control ?-Recommended to continue current medication ?I do believe patient would benefit from an SGLT-2 inhibitor.  Cost is a barrier so as  long as we can cross that first he would benefit. ? ?Patient Goals/Self-Care Activities ? Patient will:  ?- take medications as prescribed as evidenced by patient report and record review ?check glucose daily, document, and provide at future appointments ? ?Follow Up Plan: The care management team will reach out to the patient again over the next 30 days.  ? ? ?  ?  ? ?The patient verbalized understanding of instructions, educational materials, and care plan provided today and declined offer to receive copy of patient instructions, educational materials, and care plan.  ?Telephone follow up appointment with pharmacy team member scheduled for: 30 days ? ?Edythe Clarity, [facility]  ?Beverly Milch, PharmD, CPP ?Clinical Pharmacist Practitioner ?[place] ?([phone] ? ?

## 2022-01-13 NOTE — Progress Notes (Signed)
? ? ? ? ?  Chronic Care Management ?Pharmacy Assistant  ? ?Name: Edward [name]  MRN: [id number] DOB: [birth year] ? ? ?Reason for Encounter: PAP for Jardiance ? ? ?PAP form initiated for Jardiance . Will be mailed to patient to complete patient portion and return to prescribing MD office for final portion to be completed by MD and faxed in for patient for processing. Patient will update on the outcome of their application once received.  ? ? ?Liza Showfety, CCMA ?Clinical Pharmacist Assistant  ?[phone] ?  ?

## 2022-01-13 NOTE — Progress Notes (Signed)
? ?Chronic Care Management ?Pharmacy Note ? ?01/13/2022 ?Name:  Edward [name] MRN:  [id number] DOB:  [birth year] ? ?Summary: ?PharmD follow up visit.  Patient uncontrolled DM.  He never got appointment with Endo.  Referral has been closed.  Due for reckeck of A1c.  Agreed to schedule a FU with PCP.  I also believe he would benefit from SGLT-2 due to HF and uncontrolled glucose.  Diet is not conducive to euglycemia, spent considerable time today asking him to limit things such as sweets and cookies. ? ?Recommendations/Changes made from today's visit: ?Farxiga $RemoveBeforeD'10mg'TljZsUOGyfUlTX$  daily to replace Actos - will need to be approved for PAP. ? ?Plan: ?FU 30 days ? ? ?Subjective: ?Edward [name] is an 87 y.o. year old male who is a primary patient of [name], Edward Mcgee, MD.  The CCM team was consulted for assistance with disease management and care coordination needs.   ? ?Engaged with patient by telephone for initial visit in response to provider referral for pharmacy case management and/or care coordination services.  ? ?Consent to Services:  ?The patient was given the following information about Chronic Care Management services today, agreed to services, and gave verbal consent: 1. CCM service includes personalized support from designated clinical staff supervised by the primary care provider, including individualized plan of care and coordination with other care providers 2. 24/7 contact phone numbers for assistance for urgent and routine care needs. 3. Service will only be billed when office clinical staff spend 20 minutes or more in a month to coordinate care. 4. Only one practitioner may furnish and bill the service in a calendar month. 5.The patient may stop CCM services at any time (effective at the end of the month) by phone call to the office staff. 6. The patient will be responsible for cost sharing (co-pay) of up to 20% of the service fee (after annual deductible is met). Patient agreed to services and consent  obtained. ? ?Patient Care Team: ?Susy Frizzle, MD as PCP - General (Family Medicine) ?Edythe Clarity, [facility] as Pharmacist (Pharmacist) ? ?Recent office visits:  ?12/12/21 Annual Medicare Wellness Completed. ?  ?Recent consult visits:  ?None noted.  ?  ?Hospital visits: 07/07/22 - 07/08/22 ?Medication Reconciliation was completed by comparing discharge summary, patient?s EMR and Pharmacy list, and upon discussion with patient. ?  ?Admitted to the hospital on 07/07/22 due to Weakness. Discharge date was 07/08/22. Discharged from [facility].   ?  ?New?Medications Started at [facility] Discharge:?? ?None noted.  ?  ?Medication Changes at Hospital Discharge: ?None noted.  ?  ?Medications Discontinued at Hospital Discharge: ?None noted.  ?  ?Medications that remain the same after Hospital Discharge:??  ?All other medications will remain the same.   ?  ?  ?Hospital visits: 06/27/21 ?Medication Reconciliation was completed by comparing discharge summary, patient?s EMR and Pharmacy list, and upon discussion with patient. ?  ?Admitted to the hospital on 06/27/21 due to Hyperglycemia. Discharge date was 06/27/21. Discharged from [facility].   ?  ?New?Medications Started at [facility] Discharge:?? ?None noted.  ?  ?Medication Changes at Hospital Discharge: ?None noted.  ?  ?Medications Discontinued at Hospital Discharge: ?None noted. ?  ?Medications that remain the same after Hospital Discharge:??  ?All other medications will remain the same.   ? ? ?Objective: ? ?Lab Results  ?Component Value Date  ? CREATININE 1.20 09/05/2021  ? BUN 49 (H) 09/05/2021  ? EGFR 60 09/05/2021  ? GFRNONAA 39 (L) 07/07/2021  ?  GFRAA 55 (L) 03/30/2021  ? NA 130 (L) 09/05/2021  ? K 4.8 09/05/2021  ? CALCIUM 8.6 09/05/2021  ? CO2 30 09/05/2021  ? GLUCOSE 545 (HH) 09/05/2021  ? ? ?Lab Results  ?Component Value Date/Time  ? HGBA1C >14.0 (H) 09/05/2021 08:20 AM  ? HGBA1C 12.6 (H) 03/24/2021 12:33 PM  ? HGBA1C >14.0 (H) 12/01/2015 08:31 AM  ?  HGBA1C >14.0 (H) 05/27/2015 08:54 AM  ? MICROALBUR CANCELED 12/24/2020 08:29 AM  ? MICROALBUR 378.4 01/23/2020 03:09 PM  ?  ?Last diabetic Eye exam:  ?Lab Results  ?Component Value Date/Time  ? [place] 05/14/2012 12:00 AM  ?  ?Last diabetic Foot exam: No results found for: HMDIABFOOTEX  ? ?Lab Results  ?Component Value Date  ? CHOL 224 (H) 09/05/2021  ? HDL 45 09/05/2021  ? [place]  09/05/2021  ?   Comment:  ?   . ?LDL cholesterol not calculated. Triglyceride levels ?greater than 400 mg/dL invalidate calculated LDL results. ?. ?Reference range: <100 ?Marland Kitchen ?Desirable range <100 mg/dL for primary prevention;   ?<70 mg/dL for patients with CHD or diabetic patients  ?with > or = 2 CHD risk factors. ?. ?LDL-C is now calculated using the Martin-Hopkins  ?calculation, which is a validated novel method providing  ?better accuracy than the Friedewald equation in the  ?estimation of LDL-C.  ?Cresenciano Genre et al. Annamaria Helling. 1224;825(00): 2061-2068  ?(http://education.[employer].com/faq/FAQ164) ?  ? TRIG 516 (H) 09/05/2021  ? CHOLHDL 5.0 (H) 09/05/2021  ? ? ? ?  Latest Ref Rng & Units 09/05/2021  ?  8:20 AM 07/07/2021  ?  6:21 PM 03/24/2021  ? 12:33 PM  ?Hepatic Function  ?Total Protein 6.1 - 8.1 g/dL 6.5   5.7   7.0    ?Albumin 3.5 - 5.0 g/dL  3.1     ?AST 10 - 35 U/L $Remo'10   16   12    'BYueR$ ?ALT 9 - 46 U/L $Remo'9   12   9    'cCqgL$ ?Alk Phosphatase 38 - 126 U/L  67     ?Total Bilirubin 0.2 - 1.2 mg/dL 0.2   0.5   0.3    ? ? ?Lab Results  ?Component Value Date/Time  ? TSH 1.010 02/17/2018 12:45 AM  ? TSH 3.123 09/25/2017 03:12 PM  ? FREET4 0.57 (L) 02/17/2018 12:45 AM  ? ? ? ?  Latest Ref Rng & Units 07/07/2021  ?  6:21 PM 06/27/2021  ?  6:12 AM 03/24/2021  ? 12:33 PM  ?CBC  ?WBC 4.0 - 10.5 K/uL 9.6   12.9   8.0    ?Hemoglobin 13.0 - 17.0 g/dL 11.9   12.6   12.0    ?Hematocrit 39.0 - 52.0 % 36.2   36.9   36.7    ?Platelets 150 - 400 K/uL 302   338   367    ? ? ?No results found for: VD25OH ? ?Clinical ASCVD: Yes  ?The ASCVD Risk score  (Arnett DK, et al., 2019) failed to calculate for the following reasons: ?  The 2019 ASCVD risk score is only valid for ages 43 to 71   ? ? ?  12/16/2021  ?  8:18 AM 03/31/2020  ?  1:49 PM 02/23/2020  ? 11:47 AM  ?Depression screen PHQ 2/9  ?Decreased Interest 0 0 0  ?Down, Depressed, Hopeless 0 0 0  ?PHQ - 2 Score 0 0 0  ?Altered sleeping   0  ?Tired, decreased energy   0  ?  Change in appetite   0  ?Feeling bad or failure about yourself    0  ?Trouble concentrating   0  ?Moving slowly or fidgety/restless   0  ?Suicidal thoughts   0  ?PHQ-9 Score   0  ?Difficult doing work/chores   Not difficult at all  ?  ? ? ?Social History  ? ?Tobacco Use  ?Smoking Status Former  ? Packs/day: 1.50  ? Years: 60.00  ? Pack years: 90.00  ? Types: Cigarettes  ? Quit date: 12/31/2012  ? Years since quitting: 9.0  ?Smokeless Tobacco Never  ? ?BP Readings from Last 3 Encounters:  ?12/16/21 130/70  ?09/05/21 128/62  ?07/15/21 (!) 118/54  ? ?Pulse Readings from Last 3 Encounters:  ?12/16/21 65  ?09/05/21 66  ?07/15/21 (!) 58  ? ?Wt Readings from Last 3 Encounters:  ?12/16/21 155 lb 6.4 oz (70.5 kg)  ?09/05/21 157 lb (71.2 kg)  ?07/15/21 159 lb (72.1 kg)  ? ?BMI Readings from Last 3 Encounters:  ?12/16/21 25.86 kg/m?  ?09/05/21 26.13 kg/m?  ?07/15/21 26.46 kg/m?  ? ? ?Assessment/Interventions: Review of patient past medical history, allergies, medications, health status, including review of consultants reports, laboratory and other test data, was performed as part of comprehensive evaluation and provision of chronic care management services.  ? ?SDOH:  (Social Determinants of Health) assessments and interventions performed: Yes ? ?Food Insecurity: No Food Insecurity  ? Worried About Charity fundraiser in the Last Year: Never true  ? Ran Out of Food in the Last Year: Never true  ? ?Financial Resource Strain: Low Risk   ? Difficulty of Paying Living Expenses: Not hard at all  ? ? ?SDOH Screenings  ? ?Alcohol Screen: Low Risk   ? Last Alcohol  Screening Score (AUDIT): 0  ?Depression (PHQ2-9): Low Risk   ? PHQ-2 Score: 0  ?Financial Resource Strain: Low Risk   ? Difficulty of Paying Living Expenses: Not hard at all  ?Food Insecurity: No Food Insecurity  ? Worried

## 2022-01-16 ENCOUNTER — Telehealth: Payer: Self-pay

## 2022-01-16 NOTE — Telephone Encounter (Signed)
Pt's son called in requesting a refill of pregabalin (LYRICA) 100 MG capsule [[id number]].  ? ?Cb#: [phone] ? ? ?

## 2022-01-17 NOTE — Telephone Encounter (Signed)
Spoke with patient's son and advised rx sent 12/16/21 #60 3 rf, so he should contact [facility] to check on a refill. He voiced understanding and will call pharmacy. Nothing further needed at this time.  ? ?

## 2022-01-29 DIAGNOSIS — E1140 Type 2 diabetes mellitus with diabetic neuropathy, unspecified: Secondary | ICD-10-CM | POA: Diagnosis not present

## 2022-01-29 DIAGNOSIS — I5030 Unspecified diastolic (congestive) heart failure: Secondary | ICD-10-CM

## 2022-01-30 DIAGNOSIS — J449 Chronic obstructive pulmonary disease, unspecified: Secondary | ICD-10-CM | POA: Diagnosis not present

## 2022-02-06 ENCOUNTER — Other Ambulatory Visit: Payer: Self-pay | Admitting: Family Medicine

## 2022-02-06 DIAGNOSIS — J441 Chronic obstructive pulmonary disease with (acute) exacerbation: Secondary | ICD-10-CM

## 2022-02-06 NOTE — Telephone Encounter (Signed)
Son calling in requesting refill on tramadol for patient. He uses [facility] in [place]. ? ?CB# [phone] ?

## 2022-02-07 ENCOUNTER — Emergency Department (HOSPITAL_COMMUNITY)
Admission: EM | Admit: 2022-02-07 | Discharge: 2022-02-07 | Disposition: A | Discharge status: Home / Self Care | Payer: Medicare Other | Attending: Emergency Medicine | Admitting: Emergency Medicine

## 2022-02-07 ENCOUNTER — Emergency Department (HOSPITAL_COMMUNITY): Payer: Medicare Other

## 2022-02-07 DIAGNOSIS — Z79899 Other long term (current) drug therapy: Secondary | ICD-10-CM | POA: Insufficient documentation

## 2022-02-07 DIAGNOSIS — J441 Chronic obstructive pulmonary disease with (acute) exacerbation: Secondary | ICD-10-CM | POA: Diagnosis not present

## 2022-02-07 DIAGNOSIS — R0682 Tachypnea, not elsewhere classified: Secondary | ICD-10-CM | POA: Insufficient documentation

## 2022-02-07 DIAGNOSIS — Z794 Long term (current) use of insulin: Secondary | ICD-10-CM | POA: Diagnosis not present

## 2022-02-07 DIAGNOSIS — Z7951 Long term (current) use of inhaled steroids: Secondary | ICD-10-CM | POA: Diagnosis not present

## 2022-02-07 DIAGNOSIS — K449 Diaphragmatic hernia without obstruction or gangrene: Secondary | ICD-10-CM | POA: Diagnosis not present

## 2022-02-07 DIAGNOSIS — R0602 Shortness of breath: Secondary | ICD-10-CM | POA: Diagnosis not present

## 2022-02-07 LAB — COMPREHENSIVE METABOLIC PANEL
ALT: 15 U/L (ref 0–44)
AST: 16 U/L (ref 15–41)
Albumin: 3.3 g/dL — ABNORMAL LOW (ref 3.5–5.0)
Alkaline Phosphatase: 89 U/L (ref 38–126)
Anion gap: 8 (ref 5–15)
BUN: 49 mg/dL — ABNORMAL HIGH (ref 8–23)
CO2: 28 mmol/L (ref 22–32)
Calcium: 8.8 mg/dL — ABNORMAL LOW (ref 8.9–10.3)
Chloride: 94 mmol/L — ABNORMAL LOW (ref 98–111)
Creatinine, Ser: 1.3 mg/dL — ABNORMAL HIGH (ref 0.61–1.24)
GFR, Estimated: 54 mL/min — ABNORMAL LOW (ref >60)
Glucose, Bld: 497 mg/dL — ABNORMAL HIGH (ref 70–99)
Potassium: 4.8 mmol/L (ref 3.5–5.1)
Sodium: 130 mmol/L — ABNORMAL LOW (ref 135–145)
Total Bilirubin: 0.4 mg/dL (ref 0.3–1.2)
Total Protein: 6.9 g/dL (ref 6.5–8.1)

## 2022-02-07 LAB — CBC WITH DIFFERENTIAL/PLATELET
Abs Immature Granulocytes: 0.05 10*3/uL (ref 0.00–0.07)
Basophils Absolute: 0.1 10*3/uL (ref 0.0–0.1)
Basophils Relative: 1 %
Eosinophils Absolute: 0.7 10*3/uL — ABNORMAL HIGH (ref 0.0–0.5)
Eosinophils Relative: 9 %
HCT: 31.8 % — ABNORMAL LOW (ref 39.0–52.0)
Hemoglobin: 10.3 g/dL — ABNORMAL LOW (ref 13.0–17.0)
Immature Granulocytes: 1 %
Lymphocytes Relative: 18 %
Lymphs Abs: 1.3 10*3/uL (ref 0.7–4.0)
MCH: 28 pg (ref 26.0–34.0)
MCHC: 32.4 g/dL (ref 30.0–36.0)
MCV: 86.4 fL (ref 80.0–100.0)
Monocytes Absolute: 0.6 10*3/uL (ref 0.1–1.0)
Monocytes Relative: 8 %
Neutro Abs: 4.7 10*3/uL (ref 1.7–7.7)
Neutrophils Relative %: 63 %
Platelets: 302 10*3/uL (ref 150–400)
RBC: 3.68 MIL/uL — ABNORMAL LOW (ref 4.22–5.81)
RDW: 13.7 % (ref 11.5–15.5)
WBC: 7.4 10*3/uL (ref 4.0–10.5)
nRBC: 0 % (ref 0.0–0.2)

## 2022-02-07 LAB — BRAIN NATRIURETIC PEPTIDE: B Natriuretic Peptide: 214 pg/mL — ABNORMAL HIGH (ref 0.0–100.0)

## 2022-02-07 MED ORDER — PREDNISONE 20 MG PO TABS: 40.0000 mg | ORAL_TABLET | Freq: Every day | ORAL | 0 refills | Status: DC

## 2022-02-07 MED ORDER — TRAMADOL HCL 50 MG PO TABS: 50.0000 mg | ORAL_TABLET | Freq: Two times a day (BID) | ORAL | 0 refills | Status: DC | PRN

## 2022-02-07 MED ORDER — METHYLPREDNISOLONE SODIUM SUCC 125 MG IJ SOLR
125.0000 mg | Freq: Once | INTRAMUSCULAR | Status: AC
  Administered 2022-02-07: 125 mg via INTRAVENOUS
  Filled 2022-02-07: qty 2

## 2022-02-07 MED ORDER — ALBUTEROL SULFATE HFA 108 (90 BASE) MCG/ACT IN AERS: 2.0000 | INHALATION_SPRAY | RESPIRATORY_TRACT | Status: DC | PRN

## 2022-02-07 MED ORDER — ALBUTEROL SULFATE (2.5 MG/3ML) 0.083% IN NEBU
INHALATION_SOLUTION | RESPIRATORY_TRACT | Status: AC
  Administered 2022-02-07: 2.5 mg
  Filled 2022-02-07: qty 3

## 2022-02-07 MED ORDER — ALBUTEROL SULFATE (2.5 MG/3ML) 0.083% IN NEBU
10.0000 mg/h | INHALATION_SOLUTION | Freq: Once | RESPIRATORY_TRACT | Status: AC
  Filled 2022-02-07: qty 15

## 2022-02-07 MED ORDER — IPRATROPIUM-ALBUTEROL 0.5-2.5 (3) MG/3ML IN SOLN
RESPIRATORY_TRACT | Status: AC
  Administered 2022-02-07: 3 mL
  Filled 2022-02-07: qty 3

## 2022-02-07 NOTE — Telephone Encounter (Signed)
Requested medication (s) are due for refill today:   Provider to review ? ?Requested medication (s) are on the active medication list:   Yes ? ?Future visit scheduled:   No ? ? ?Last ordered: 01/10/2022 #60, 0 refills ? ?Returned because it's a non delegated refill  ? ?Requested Prescriptions  ?Pending Prescriptions Disp Refills  ? traMADol (ULTRAM) 50 MG tablet 60 tablet 0  ?  Sig: Take 1 tablet (50 mg total) by mouth every 12 (twelve) hours as needed.  ?  ? Not Delegated - Analgesics:  Opioid Agonists Failed - 02/06/2022  2:28 PM  ?  ?  Failed - This refill cannot be delegated  ?  ?  Failed - Valid encounter within last 3 months  ?  Recent Outpatient Visits   ? ?      ? 5 months ago Uncontrolled type 2 diabetes mellitus with hyperglycemia ([place])  ? [facility] [name], [name], MD  ? 6 months ago COPD with acute exacerbation [facility])  ? [facility] [name], [name], MD  ? 7 months ago Uncontrolled type 2 diabetes mellitus with hyperglycemia ([place])  ? [facility] [name], [name], MD  ? 7 months ago Confusion  ? [facility] [name], [name], MD  ? 10 months ago DM type 2 causing vascular disease [facility])  ? [place], NP  ? ?  ?  ? ? ?  ?  ?  Passed - Urine Drug Screen completed in last 360 days  ?  ?  ? ?

## 2022-02-07 NOTE — Discharge Instructions (Addendum)
In addition to the prescribed steroids, please use your albuterol every 4 hours for the next 2 days.  You may then use it as needed.  With today's ED evaluation for COPD exacerbation is very poor and that he follow-up with your primary care physician or return here for concerning changes in your condition. ?

## 2022-02-07 NOTE — Telephone Encounter (Signed)
LOV 09/05/21 ?Last refill 01/10/22, #60, 0 refills ? ?Please review, thanks! ? ?

## 2022-02-07 NOTE — ED Triage Notes (Signed)
Pt came in with c/o SOB. Pt has COPD. Pt states it has been getting worse for a week. Wears 3L O2 at home. On arrival not wearing O2. Pt RA sats 74, and now 100% on 3L. Pt has audible wheezing and was tripod positioning on arrival ?

## 2022-02-07 NOTE — ED Provider Notes (Signed)
?[facility] ?Provider Note ? ? ?CSN: [id number] ?Arrival date & time: 02/07/22  1497 ? ?  ? ?History ? ?Chief Complaint  ?Patient presents with  ? Shortness of Breath  ? ? ?Edward [name] is a 87 y.o. male. ? ?HPI ?Patient presents with dyspnea.  Patient has a history of COPD.  He notes that his dyspnea is become worse over the past week or so.  He wears 3 L oxygen at home typically, but on room air on arrival saturation was 75%.  With replacement of his oxygen he is now 100%.  Per nursing the patient was tripoding on arrival.  Patient denies pain, fever. ?  ? ?Home Medications ?Prior to Admission medications   ?Medication Sig Start Date End Date Taking? Authorizing Provider  ?predniSONE (DELTASONE) 20 MG tablet Take 2 tablets (40 mg total) by mouth daily with breakfast. For the next four days 02/07/22  Yes Carmin Muskrat, MD  ?acetaminophen (TYLENOL) 500 MG tablet Take 500 mg by mouth every 6 (six) hours as needed for headache.    [provider]  ?albuterol (PROVENTIL) (2.5 MG/3ML) 0.083% nebulizer solution INHALE 3ML BY NEBULIZATION ROUTE EVERY 6 HOURS AS NEEDED FOR WHEEZING OR SHORTNESS OF BREATH 12/09/21   Susy Frizzle, MD  ?budesonide-formoterol [facility]) 160-4.5 MCG/ACT inhaler Inhale 2 puffs into the lungs 2 (two) times daily. 07/15/21   Susy Frizzle, MD  ?cloNIDine (CATAPRES) 0.1 MG tablet TAKE 1 TABLET BY MOUTH TWICE DAILY 01/04/22   Susy Frizzle, MD  ?Arne [place] 5 MG TABS tablet Take 1 tablet by mouth twice daily 03/05/20   Susy Frizzle, MD  ?furosemide (LASIX) 40 MG tablet TAKE 1 TABLET BY MOUTH DAILY 12/09/21   Susy Frizzle, MD  ?gabapentin (NEURONTIN) 100 MG capsule TAKE 1 CAPSULE BY MOUTH THREE TIMES DAILY 09/15/20   Susy Frizzle, MD  ?insulin glargine (LANTUS SOLOSTAR) 100 UNIT/ML Solostar Pen Inject 45 Units into the skin at bedtime. 11/23/21   Susy Frizzle, MD  ?ipratropium-albuterol (DUONEB) 0.5-2.5 (3) MG/3ML SOLN Take 3 mLs by nebulization  every 6 (six) hours as needed. 12/30/19   Susy Frizzle, MD  ?Lancets [facility] DELICA PLUS [id number]) MISC USE TO CHECK BLOOD SUGAR TWICE DAILY AS DIRECTED 12/08/20   Susy Frizzle, MD  ?Lidocaine (HM LIDOCAINE PATCH) 4 % PTCH Apply 1 patch topically daily. 03/30/21   Eulogio Bear, NP  ?losartan (COZAAR) 25 MG tablet Take 1 tablet (25 mg total) by mouth daily. 08/03/20   Susy Frizzle, MD  ?metoprolol tartrate (LOPRESSOR) 25 MG tablet TAKE 1 TABLET BY MOUTH TWICE DAILY 10/18/21   Susy Frizzle, MD  ?[facility] ULTRA test strip CHECK FASTING BLOOD SUGAR TWICE DAILY 02/22/21   Susy Frizzle, MD  ?OXYGEN Inhale 3 L into the lungs continuous.     [provider]  ?pioglitazone (ACTOS) 30 MG tablet Take 1 tablet (30 mg total) by mouth daily. 09/05/21   Susy Frizzle, MD  ?potassium chloride SA (KLOR-CON) 20 MEQ tablet Take 1 tablet by mouth once daily 03/01/21   Susy Frizzle, MD  ?pravastatin (PRAVACHOL) 80 MG tablet TAKE 1 TABLET BY MOUTH AT BEDTIME 01/04/22   Susy Frizzle, MD  ?pregabalin (LYRICA) 100 MG capsule Take 1 capsule (100 mg total) by mouth 2 (two) times daily. 12/16/21   Susy Frizzle, MD  ?tamsulosin (FLOMAX) 0.4 MG CAPS capsule Take 1 capsule (0.4 mg total) by mouth daily. 05/14/20  Susy Frizzle, MD  ?traMADol (ULTRAM) 50 MG tablet Take 1 tablet (50 mg total) by mouth every 12 (twelve) hours as needed. 01/10/22   Susy Frizzle, MD  ?   ? ?Allergies    ?Ace inhibitors   ? ?Review of Systems   ?Review of Systems  ?All other systems reviewed and are negative. ? ?Physical Exam ?Updated Vital Signs ?BP (!) 162/79   Pulse 68   Temp (!) 96.6 ?F (35.9 ?C) (Axillary)   Resp 18   Ht '5\' 5"'$  (1.651 m)   Wt 72.6 kg   SpO2 100%   BMI 26.63 kg/m?  ?Physical Exam ?Vitals and nursing note reviewed.  ?Constitutional:   ?   General: He is in acute distress.  ?   Appearance: He is ill-appearing and diaphoretic.  ?HENT:  ?   Head: Normocephalic and atraumatic.  ?Eyes:  ?    Conjunctiva/sclera: Conjunctivae normal.  ?Cardiovascular:  ?   Rate and Rhythm: Regular rhythm. Tachycardia present.  ?Pulmonary:  ?   Effort: Tachypnea, accessory muscle usage and respiratory distress present.  ?   Breath sounds: Decreased breath sounds, wheezing and rhonchi present.  ?Abdominal:  ?   General: There is no distension.  ?Skin: ?   General: Skin is warm.  ?Neurological:  ?   Mental Status: He is alert and oriented to person, place, and time.  ? ? ?ED Results / Procedures / Treatments   ?Labs ?(all labs ordered are listed, but only abnormal results are displayed) ?Labs Reviewed  ?COMPREHENSIVE METABOLIC PANEL - Abnormal; Notable for the following components:  ?    Result Value  ? Sodium 130 (*)   ? Chloride 94 (*)   ? Glucose, Bld 497 (*)   ? BUN 49 (*)   ? Creatinine, Ser 1.30 (*)   ? Calcium 8.8 (*)   ? Albumin 3.3 (*)   ? GFR, Estimated 54 (*)   ? All other components within normal limits  ?CBC WITH DIFFERENTIAL/PLATELET - Abnormal; Notable for the following components:  ? RBC 3.68 (*)   ? Hemoglobin 10.3 (*)   ? HCT 31.8 (*)   ? Eosinophils Absolute 0.7 (*)   ? All other components within normal limits  ?BRAIN NATRIURETIC PEPTIDE - Abnormal; Notable for the following components:  ? B Natriuretic Peptide 214.0 (*)   ? All other components within normal limits  ? ? ?EKG ?EKG Interpretation ? ?Date/Time:  Tuesday Feb 07 2022 06:36:57 EDT ?Ventricular Rate:  76 ?PR Interval:  164 ?QRS Duration: 81 ?QT Interval:  404 ?QTC Calculation: 455 ?R Axis:   61 ?Text Interpretation: Sinus rhythm Borderline low voltage, extremity leads Baseline wander Otherwise within normal limits Confirmed by Carmin Muskrat [phone]) on 02/07/2022 10:09:25 AM ? ?Radiology ?DG Chest Port 1 View ? ?Result Date: 02/07/2022 ?CLINICAL DATA:  Worsening shortness of breath. History of pulmonary fibrosis. Former smoker. COPD. EXAM: PORTABLE CHEST 1 VIEW COMPARISON:  12/19/2020 FINDINGS: Stable cardiomediastinal contours. Moderate hiatal  hernia. Chronic interstitial changes emphysema. Bilateral lower lung zone predominant reticular interstitial markings are identified compatible with chronic interstitial lung disease. No superimposed pleural effusion, airspace consolidation, or pneumothorax. Nodular density within the left midlung measures 1 cm. Not confidently identified on previous radiographs. IMPRESSION: 1. No acute cardiopulmonary abnormalities. 2. Chronic interstitial lung disease. 3. Indeterminate 1 cm nodular density within the left midlung. Not confidently identified on previous radiographs. Consider more definitive characterization with CT of the chest. Electronically Signed   By: Lovena Le  Clovis Riley M.D.   On: 02/07/2022 07:25   ? ?Procedures ?Procedures  ? ? ?Medications Ordered in ED ?Medications  ?ipratropium-albuterol (DUONEB) 0.5-2.5 (3) MG/3ML nebulizer solution (3 mLs  Given 02/07/22 6384)  ?albuterol (PROVENTIL) (2.5 MG/3ML) 0.083% nebulizer solution (2.5 mg Nebulization Given 02/07/22 5364)  ?methylPREDNISolone sodium succinate (SOLU-MEDROL) 125 mg/2 mL injection 125 mg (125 mg Intravenous Given 02/07/22 0736)  ? ? ?ED Course/ Medical Decision Making/ A&P ?This patient with a Hx of COPD, pulmonary fibrosis, chronic respiratory failure, on home oxygen as well as A-fib presents to the ED for concern of respiratory distress, this involves an extensive number of treatment options, and is a complaint that carries with it a high risk of complications and morbidity.   ? ?The differential diagnosis includes COPD exacerbation, pneumonia, worsening fibrosis, bacteremia, sepsis, less likely ACS versus heart failure ? ? ?Social Determinants of Health: ? ?Age, chronic oxygen use ? ?Additional history obtained: ? ?Additional history and/or information obtained from chart review, notable for pulmonary fibrosis, htn, COPD, home oxygen use ? ? ?After the initial evaluation, orders, including: Continuous neb, steroids after initial bronchodilators did not  result in change were initiated. ? ? ?Patient placed on Cardiac and Pulse-Oximetry Monitors. ?The patient was maintained on a cardiac monitor.  The cardiac monitored showed an rhythm of 80 sinus normal ?The pati

## 2022-02-10 ENCOUNTER — Ambulatory Visit (INDEPENDENT_AMBULATORY_CARE_PROVIDER_SITE_OTHER): Payer: Medicare Other | Admitting: Family Medicine

## 2022-02-10 VITALS — BP 142/80 | HR 72 | Temp 97.0°F | Ht 65.0 in | Wt 158.0 lb

## 2022-02-10 DIAGNOSIS — J441 Chronic obstructive pulmonary disease with (acute) exacerbation: Secondary | ICD-10-CM | POA: Diagnosis not present

## 2022-02-10 MED ORDER — FLUTICASONE-SALMETEROL 500-50 MCG/ACT IN AEPB: 1.0000 | INHALATION_SPRAY | Freq: Two times a day (BID) | RESPIRATORY_TRACT | 11 refills | Status: DC

## 2022-02-10 NOTE — Progress Notes (Signed)
? ? ?Subjective:  ? ? Patient ID: Edward [name], male    DOB: 03/22/1935, 87 y.o.   MRN: [id number] ? ?HPI ?Please see my office visit from September.  At that time the patient had a COPD exacerbation due to the fact he stopped his Symbicort. ? ?Recently he had go to emergency room for hypoxia and shortness of breath.  I started him on prednisone 40 mg a day for 4 days after giving him nebulizer treatments in the emergency room.  He presents today for follow-up.  He is 92% on room air.  He is not even wearing his oxygen.  He is supposed to be on 2 to 3 L via nasal cannula.  I asked the patient what happened.  He states that he does not know.  I asked him whether he is been taking his Symbicort and he states that he ran out.  Therefore he has been off his maintenance inhaler for an undefined period of time.  He is not sure how long he has been out of it.  However today he is wheezing audibly on exam and coughing frequently during our encounter.  Thankfully there is no respiratory distress or hypoxia. ?Past Medical History:  ?Diagnosis Date  ? Allergy   ? Rhinitis  ? Atrial fibrillation ([place])   ? Bronchitis   ? Chronic respiratory failure ([place])   ? Colon polyps   ? COPD (chronic obstructive pulmonary disease) ([place])   ? Diabetes mellitus   ? Elevated lipids   ? Hypercholesterolemia   ? Hypertension   ? Iron deficiency anemia due to chronic blood loss 04/30/2018  ? Noncompliance   ? On home O2   ? 2L N/C   ? PSA elevation   ? Pulmonary fibrosis ([place])   ? Vitamin D deficiency   ? ?Past Surgical History:  ?Procedure Laterality Date  ? BIOPSY  04/23/2018  ? Procedure: BIOPSY;  Surgeon: Danie Binder, MD;  Location: [facility];  Service: Endoscopy;;  duodenum ?gastric  ? CATARACT EXTRACTION W/PHACO  06/25/2012  ? Procedure: CATARACT EXTRACTION PHACO AND INTRAOCULAR LENS PLACEMENT (IOC);  Surgeon: Elta Guadeloupe T. Gershon Crane, MD;  Location: [facility] ORS;  Service: Ophthalmology;  Laterality: Left;  CDE=19.01  ? CATARACT EXTRACTION W/PHACO   07/09/2012  ? Procedure: CATARACT EXTRACTION PHACO AND INTRAOCULAR LENS PLACEMENT (IOC);  Surgeon: Elta Guadeloupe T. Gershon Crane, MD;  Location: [facility];  Service: Ophthalmology;  Laterality: Right;  CDE: 20.09  ? COLONOSCOPY WITH PROPOFOL N/A 04/23/2018  ? Procedure: COLONOSCOPY WITH PROPOFOL;  Surgeon: Danie Binder, MD;  Location: [facility];  Service: Endoscopy;  Laterality: N/A;  ? ESOPHAGOGASTRODUODENOSCOPY (EGD) WITH PROPOFOL N/A 04/23/2018  ? Procedure: ESOPHAGOGASTRODUODENOSCOPY (EGD) WITH PROPOFOL;  Surgeon: Danie Binder, MD;  Location: [facility];  Service: Endoscopy;  Laterality: N/A;  ? ?Current Outpatient Medications on File Prior to Visit  ?Medication Sig Dispense Refill  ? acetaminophen (TYLENOL) 500 MG tablet Take 500 mg by mouth every 6 (six) hours as needed for headache.    ? albuterol (PROVENTIL) (2.5 MG/3ML) 0.083% nebulizer solution INHALE 3ML BY NEBULIZATION ROUTE EVERY 6 HOURS AS NEEDED FOR WHEEZING OR SHORTNESS OF BREATH 3 mL 3  ? budesonide-formoterol (SYMBICORT) 160-4.5 MCG/ACT inhaler Inhale 2 puffs into the lungs 2 (two) times daily. 1 each 11  ? cloNIDine (CATAPRES) 0.1 MG tablet TAKE 1 TABLET BY MOUTH TWICE DAILY 60 tablet 3  ? ELIQUIS 5 MG TABS tablet Take 1 tablet by mouth twice daily 60 tablet  11  ? furosemide (LASIX) 40 MG tablet TAKE 1 TABLET BY MOUTH DAILY 30 tablet 3  ? gabapentin (NEURONTIN) 100 MG capsule TAKE 1 CAPSULE BY MOUTH THREE TIMES DAILY 90 capsule 0  ? insulin glargine (LANTUS SOLOSTAR) 100 UNIT/ML Solostar Pen Inject 45 Units into the skin at bedtime. 15 mL 11  ? ipratropium-albuterol (DUONEB) 0.5-2.5 (3) MG/3ML SOLN Take 3 mLs by nebulization every 6 (six) hours as needed. 360 mL 1  ? Lancets (ONETOUCH DELICA PLUS [id number]) MISC USE TO CHECK BLOOD SUGAR TWICE DAILY AS DIRECTED 100 each 0  ? Lidocaine (HM LIDOCAINE PATCH) 4 % PTCH Apply 1 patch topically daily. 1 patch 3  ? losartan (COZAAR) 25 MG tablet Take 1 tablet (25 mg total) by mouth daily. 90 tablet 3  ?  metoprolol tartrate (LOPRESSOR) 25 MG tablet TAKE 1 TABLET BY MOUTH TWICE DAILY 60 tablet 3  ? ONETOUCH ULTRA test strip CHECK FASTING BLOOD SUGAR TWICE DAILY 50 strip 3  ? OXYGEN Inhale 3 L into the lungs continuous.     ? pioglitazone (ACTOS) 30 MG tablet Take 1 tablet (30 mg total) by mouth daily. 90 tablet 3  ? potassium chloride SA (KLOR-CON) 20 MEQ tablet Take 1 tablet by mouth once daily 90 tablet 0  ? pravastatin (PRAVACHOL) 80 MG tablet TAKE 1 TABLET BY MOUTH AT BEDTIME 30 tablet 3  ? predniSONE (DELTASONE) 20 MG tablet Take 2 tablets (40 mg total) by mouth daily with breakfast. For the next four days 8 tablet 0  ? pregabalin (LYRICA) 100 MG capsule Take 1 capsule (100 mg total) by mouth 2 (two) times daily. 60 capsule 3  ? tamsulosin (FLOMAX) 0.4 MG CAPS capsule Take 1 capsule (0.4 mg total) by mouth daily. 30 capsule 3  ? traMADol (ULTRAM) 50 MG tablet Take 1 tablet (50 mg total) by mouth every 12 (twelve) hours as needed. 60 tablet 0  ? ?No current facility-administered medications on file prior to visit.  ? ?Allergies  ?Allergen Reactions  ? Ace Inhibitors Other (See Comments)  ?  Hyperkalemia--07/23/2013:patient states not familiar with the following allergy  ? ?Social History  ? ?Socioeconomic History  ? Marital status: Married  ?  Spouse name: [name]  ? Number of children: 2  ? Years of education: Not on file  ? Highest education level: Not on file  ?Occupational History  ? Occupation: [employer]  ? 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 Smoking status: Former  ?  Packs/day: 1.50  ?  Years: 60.00  ?  Pack years: 90.00  ?  Types: Cigarettes  ?  Quit date: 12/31/2012  ?  Years since quitting: 9.1  ? Smokeless tobacco: Never  ?Vaping Use  ? Vaping Use: Never used  ?Substance and Sexual Activity  ? Alcohol use: No  ? Drug use: No  ? Sexual activity: Yes  ?  Birth control/protection: None  ?Other Topics Concern  ? Not on file  ?Social History Narrative  ? Lives with wife. Married 50+ years.  ? Grown  twin sons.   ? ?Social Determinants of Health  ? ?Financial Resource Strain: Low Risk   ? Difficulty of Paying Living Expenses: Not hard at all  ?Food Insecurity: No Food Insecurity  ? Worried About Charity fundraiser in the Last Year: Never true  ? Ran Out of Food in the Last Year: Never true  ?Transportation Needs: No Transportation Needs  ? Lack of Transportation (Medical): No  ? Lack  of Transportation (Non-Medical): No  ?Physical Activity: Inactive  ? Days of Exercise per Week: 0 days  ? Minutes of Exercise per Session: 0 min  ?Stress: No Stress Concern Present  ? Feeling of Stress : Not at all  ?Social Connections: Socially Integrated  ? Frequency of Communication with Friends and Family: More than three times a week  ? Frequency of Social Gatherings with Friends and Family: More than three times a week  ? Attends Religious Services: More than 4 times per year  ? Active Member of Clubs or Organizations: Yes  ? Attends Archivist Meetings: More than 4 times per year  ? Marital Status: Married  ?Intimate Partner Violence: Not At Risk  ? Fear of Current or Ex-Partner: No  ? Emotionally Abused: No  ? Physically Abused: No  ? Sexually Abused: No  ? ? ? ?Review of Systems ? ?   ?Objective:  ? Physical Exam ?Vitals reviewed.  ?Constitutional:   ?   Appearance: He is obese. He is not ill-appearing or toxic-appearing.  ?HENT:  ?   Mouth/Throat:  ?   Mouth: Mucous membranes are dry.  ?Cardiovascular:  ?   Rate and Rhythm: Normal rate. Rhythm irregular.  ?   Heart sounds: Normal heart sounds.  ?Pulmonary:  ?   Effort: Pulmonary effort is normal. No respiratory distress.  ?   Breath sounds: Decreased air movement present. No stridor. Decreased breath sounds and wheezing present. No rhonchi.  ?Abdominal:  ?   General: Abdomen is flat. There is no distension.  ?   Palpations: Abdomen is soft.  ?   Tenderness: There is no abdominal tenderness. There is no guarding.  ?Musculoskeletal:  ?   Cervical back: Neck  supple.  ?   Right lower leg: No edema.  ?   Left lower leg: No edema.  ?Lymphadenopathy:  ?   Cervical: No cervical adenopathy.  ?Neurological:  ?   Mental Status: He is alert.  ? ? ? ? ? ?   ?Assessment & Plan:  ?COP

## 2022-03-02 DIAGNOSIS — J449 Chronic obstructive pulmonary disease, unspecified: Secondary | ICD-10-CM | POA: Diagnosis not present

## 2022-03-10 ENCOUNTER — Inpatient Hospital Stay (HOSPITAL_COMMUNITY): Payer: Medicare Other

## 2022-03-10 ENCOUNTER — Emergency Department (HOSPITAL_COMMUNITY): Payer: Medicare Other

## 2022-03-10 ENCOUNTER — Other Ambulatory Visit: Payer: Self-pay

## 2022-03-10 ENCOUNTER — Inpatient Hospital Stay (HOSPITAL_COMMUNITY)
Admission: EM | Admit: 2022-03-10 | Discharge: 2022-03-14 | DRG: 871 | Disposition: A | Discharge status: Home Health | Payer: Medicare Other | Attending: Internal Medicine | Admitting: Internal Medicine

## 2022-03-10 ENCOUNTER — Telehealth: Payer: Self-pay | Admitting: Family Medicine

## 2022-03-10 DIAGNOSIS — E1140 Type 2 diabetes mellitus with diabetic neuropathy, unspecified: Secondary | ICD-10-CM

## 2022-03-10 DIAGNOSIS — I480 Paroxysmal atrial fibrillation: Secondary | ICD-10-CM | POA: Diagnosis not present

## 2022-03-10 DIAGNOSIS — D631 Anemia in chronic kidney disease: Secondary | ICD-10-CM | POA: Diagnosis present

## 2022-03-10 DIAGNOSIS — E1165 Type 2 diabetes mellitus with hyperglycemia: Secondary | ICD-10-CM | POA: Diagnosis not present

## 2022-03-10 DIAGNOSIS — I5022 Chronic systolic (congestive) heart failure: Secondary | ICD-10-CM | POA: Diagnosis not present

## 2022-03-10 DIAGNOSIS — Z20822 Contact with and (suspected) exposure to covid-19: Secondary | ICD-10-CM | POA: Diagnosis not present

## 2022-03-10 DIAGNOSIS — R6521 Severe sepsis with septic shock: Secondary | ICD-10-CM

## 2022-03-10 DIAGNOSIS — Z7951 Long term (current) use of inhaled steroids: Secondary | ICD-10-CM

## 2022-03-10 DIAGNOSIS — E860 Dehydration: Secondary | ICD-10-CM | POA: Diagnosis present

## 2022-03-10 DIAGNOSIS — A419 Sepsis, unspecified organism: Principal | ICD-10-CM | POA: Diagnosis present

## 2022-03-10 DIAGNOSIS — J189 Pneumonia, unspecified organism: Secondary | ICD-10-CM | POA: Diagnosis present

## 2022-03-10 DIAGNOSIS — I130 Hypertensive heart and chronic kidney disease with heart failure and stage 1 through stage 4 chronic kidney disease, or unspecified chronic kidney disease: Secondary | ICD-10-CM | POA: Diagnosis not present

## 2022-03-10 DIAGNOSIS — N1832 Chronic kidney disease, stage 3b: Secondary | ICD-10-CM | POA: Diagnosis not present

## 2022-03-10 DIAGNOSIS — Z743 Need for continuous supervision: Secondary | ICD-10-CM | POA: Diagnosis not present

## 2022-03-10 DIAGNOSIS — E1122 Type 2 diabetes mellitus with diabetic chronic kidney disease: Secondary | ICD-10-CM | POA: Diagnosis present

## 2022-03-10 DIAGNOSIS — I5032 Chronic diastolic (congestive) heart failure: Secondary | ICD-10-CM

## 2022-03-10 DIAGNOSIS — N179 Acute kidney failure, unspecified: Secondary | ICD-10-CM | POA: Diagnosis present

## 2022-03-10 DIAGNOSIS — R627 Adult failure to thrive: Secondary | ICD-10-CM | POA: Diagnosis not present

## 2022-03-10 DIAGNOSIS — J441 Chronic obstructive pulmonary disease with (acute) exacerbation: Secondary | ICD-10-CM | POA: Diagnosis not present

## 2022-03-10 DIAGNOSIS — Z9981 Dependence on supplemental oxygen: Secondary | ICD-10-CM

## 2022-03-10 DIAGNOSIS — T380X5A Adverse effect of glucocorticoids and synthetic analogues, initial encounter: Secondary | ICD-10-CM | POA: Diagnosis not present

## 2022-03-10 DIAGNOSIS — J9621 Acute and chronic respiratory failure with hypoxia: Secondary | ICD-10-CM | POA: Diagnosis not present

## 2022-03-10 DIAGNOSIS — I4891 Unspecified atrial fibrillation: Secondary | ICD-10-CM | POA: Diagnosis not present

## 2022-03-10 DIAGNOSIS — J440 Chronic obstructive pulmonary disease with acute lower respiratory infection: Secondary | ICD-10-CM | POA: Diagnosis present

## 2022-03-10 DIAGNOSIS — Z87891 Personal history of nicotine dependence: Secondary | ICD-10-CM

## 2022-03-10 DIAGNOSIS — Z888 Allergy status to other drugs, medicaments and biological substances status: Secondary | ICD-10-CM

## 2022-03-10 DIAGNOSIS — E7800 Pure hypercholesterolemia, unspecified: Secondary | ICD-10-CM | POA: Diagnosis present

## 2022-03-10 DIAGNOSIS — D6489 Other specified anemias: Secondary | ICD-10-CM | POA: Diagnosis not present

## 2022-03-10 DIAGNOSIS — G9341 Metabolic encephalopathy: Secondary | ICD-10-CM | POA: Diagnosis not present

## 2022-03-10 DIAGNOSIS — R4182 Altered mental status, unspecified: Secondary | ICD-10-CM

## 2022-03-10 DIAGNOSIS — I248 Other forms of acute ischemic heart disease: Secondary | ICD-10-CM | POA: Diagnosis not present

## 2022-03-10 DIAGNOSIS — R410 Disorientation, unspecified: Secondary | ICD-10-CM | POA: Diagnosis not present

## 2022-03-10 DIAGNOSIS — J9601 Acute respiratory failure with hypoxia: Principal | ICD-10-CM

## 2022-03-10 DIAGNOSIS — Z7901 Long term (current) use of anticoagulants: Secondary | ICD-10-CM

## 2022-03-10 DIAGNOSIS — Z833 Family history of diabetes mellitus: Secondary | ICD-10-CM

## 2022-03-10 DIAGNOSIS — R6889 Other general symptoms and signs: Secondary | ICD-10-CM | POA: Diagnosis not present

## 2022-03-10 DIAGNOSIS — Z8249 Family history of ischemic heart disease and other diseases of the circulatory system: Secondary | ICD-10-CM

## 2022-03-10 DIAGNOSIS — R7989 Other specified abnormal findings of blood chemistry: Secondary | ICD-10-CM | POA: Diagnosis not present

## 2022-03-10 DIAGNOSIS — Z79899 Other long term (current) drug therapy: Secondary | ICD-10-CM

## 2022-03-10 DIAGNOSIS — R600 Localized edema: Secondary | ICD-10-CM

## 2022-03-10 DIAGNOSIS — R778 Other specified abnormalities of plasma proteins: Secondary | ICD-10-CM | POA: Diagnosis not present

## 2022-03-10 DIAGNOSIS — Z91199 Patient's noncompliance with other medical treatment and regimen due to unspecified reason: Secondary | ICD-10-CM

## 2022-03-10 DIAGNOSIS — Z794 Long term (current) use of insulin: Secondary | ICD-10-CM

## 2022-03-10 DIAGNOSIS — I499 Cardiac arrhythmia, unspecified: Secondary | ICD-10-CM | POA: Diagnosis not present

## 2022-03-10 DIAGNOSIS — R6520 Severe sepsis without septic shock: Secondary | ICD-10-CM | POA: Diagnosis not present

## 2022-03-10 DIAGNOSIS — Z86010 Personal history of colonic polyps: Secondary | ICD-10-CM

## 2022-03-10 DIAGNOSIS — J984 Other disorders of lung: Secondary | ICD-10-CM | POA: Diagnosis not present

## 2022-03-10 DIAGNOSIS — Z09 Encounter for follow-up examination after completed treatment for conditions other than malignant neoplasm: Secondary | ICD-10-CM

## 2022-03-10 LAB — COMPREHENSIVE METABOLIC PANEL
ALT: 18 U/L (ref 0–44)
AST: 42 U/L — ABNORMAL HIGH (ref 15–41)
Albumin: 2.6 g/dL — ABNORMAL LOW (ref 3.5–5.0)
Alkaline Phosphatase: 81 U/L (ref 38–126)
Anion gap: 16 — ABNORMAL HIGH (ref 5–15)
BUN: 62 mg/dL — ABNORMAL HIGH (ref 8–23)
CO2: 24 mmol/L (ref 22–32)
Calcium: 8.8 mg/dL — ABNORMAL LOW (ref 8.9–10.3)
Chloride: 95 mmol/L — ABNORMAL LOW (ref 98–111)
Creatinine, Ser: 2.61 mg/dL — ABNORMAL HIGH (ref 0.61–1.24)
GFR, Estimated: 23 mL/min — ABNORMAL LOW (ref >60)
Glucose, Bld: 115 mg/dL — ABNORMAL HIGH (ref 70–99)
Potassium: 5.1 mmol/L (ref 3.5–5.1)
Sodium: 135 mmol/L (ref 135–145)
Total Bilirubin: 0.6 mg/dL (ref 0.3–1.2)
Total Protein: 6 g/dL — ABNORMAL LOW (ref 6.5–8.1)

## 2022-03-10 LAB — URINALYSIS, ROUTINE W REFLEX MICROSCOPIC
Bilirubin Urine: NEGATIVE
Glucose, UA: >=500 mg/dL — AB
Ketones, ur: NEGATIVE mg/dL
Leukocytes,Ua: NEGATIVE
Nitrite: NEGATIVE
Protein, ur: >=300 mg/dL — AB
Specific Gravity, Urine: 1.013 (ref 1.005–1.030)
pH: 5 (ref 5.0–8.0)

## 2022-03-10 LAB — APTT: aPTT: 30 seconds (ref 24–36)

## 2022-03-10 LAB — CBC WITH DIFFERENTIAL/PLATELET
Abs Immature Granulocytes: 1.39 10*3/uL — ABNORMAL HIGH (ref 0.00–0.07)
Basophils Absolute: 0.1 10*3/uL (ref 0.0–0.1)
Basophils Relative: 0 %
Eosinophils Absolute: 0 10*3/uL (ref 0.0–0.5)
Eosinophils Relative: 0 %
HCT: 38.6 % — ABNORMAL LOW (ref 39.0–52.0)
Hemoglobin: 12.3 g/dL — ABNORMAL LOW (ref 13.0–17.0)
Immature Granulocytes: 8 %
Lymphocytes Relative: 9 %
Lymphs Abs: 1.6 10*3/uL (ref 0.7–4.0)
MCH: 27.8 pg (ref 26.0–34.0)
MCHC: 31.9 g/dL (ref 30.0–36.0)
MCV: 87.1 fL (ref 80.0–100.0)
Monocytes Absolute: 1.6 10*3/uL — ABNORMAL HIGH (ref 0.1–1.0)
Monocytes Relative: 9 %
Neutro Abs: 13.8 10*3/uL — ABNORMAL HIGH (ref 1.7–7.7)
Neutrophils Relative %: 74 %
Platelets: 259 10*3/uL (ref 150–400)
RBC: 4.43 MIL/uL (ref 4.22–5.81)
RDW: 14.4 % (ref 11.5–15.5)
Smear Review: NORMAL
WBC: 18.5 10*3/uL — ABNORMAL HIGH (ref 4.0–10.5)
nRBC: 0 % (ref 0.0–0.2)

## 2022-03-10 LAB — I-STAT VENOUS BLOOD GAS, ED
Acid-Base Excess: 4 mmol/L — ABNORMAL HIGH (ref 0.0–2.0)
Bicarbonate: 30.9 mmol/L — ABNORMAL HIGH (ref 20.0–28.0)
Calcium, Ion: 1.11 mmol/L — ABNORMAL LOW (ref 1.15–1.40)
HCT: 35 % — ABNORMAL LOW (ref 39.0–52.0)
Hemoglobin: 11.9 g/dL — ABNORMAL LOW (ref 13.0–17.0)
O2 Saturation: 84 %
Potassium: 5.6 mmol/L — ABNORMAL HIGH (ref 3.5–5.1)
Sodium: 131 mmol/L — ABNORMAL LOW (ref 135–145)
TCO2: 33 mmol/L — ABNORMAL HIGH (ref 22–32)
pCO2, Ven: 57.1 mmHg (ref 44–60)
pH, Ven: 7.341 (ref 7.25–7.43)
pO2, Ven: 53 mmHg — ABNORMAL HIGH (ref 32–45)

## 2022-03-10 LAB — HEMOGLOBIN A1C
Hgb A1c MFr Bld: 13.6 % — ABNORMAL HIGH (ref 4.8–5.6)
Mean Plasma Glucose: 343.62 mg/dL

## 2022-03-10 LAB — GLUCOSE, CAPILLARY
Glucose-Capillary: 101 mg/dL — ABNORMAL HIGH (ref 70–99)
Glucose-Capillary: 96 mg/dL (ref 70–99)
Glucose-Capillary: 91 mg/dL (ref 70–99)

## 2022-03-10 LAB — MAGNESIUM: Magnesium: 2.4 mg/dL (ref 1.7–2.4)

## 2022-03-10 LAB — I-STAT ARTERIAL BLOOD GAS, ED
Acid-Base Excess: 3 mmol/L — ABNORMAL HIGH (ref 0.0–2.0)
Bicarbonate: 28.3 mmol/L — ABNORMAL HIGH (ref 20.0–28.0)
Calcium, Ion: 1.18 mmol/L (ref 1.15–1.40)
HCT: 31 % — ABNORMAL LOW (ref 39.0–52.0)
Hemoglobin: 10.5 g/dL — ABNORMAL LOW (ref 13.0–17.0)
O2 Saturation: 95 %
Potassium: 4.3 mmol/L (ref 3.5–5.1)
Sodium: 133 mmol/L — ABNORMAL LOW (ref 135–145)
TCO2: 30 mmol/L (ref 22–32)
pCO2 arterial: 45.7 mmHg (ref 32–48)
pH, Arterial: 7.4 (ref 7.35–7.45)
pO2, Arterial: 74 mmHg — ABNORMAL LOW (ref 83–108)

## 2022-03-10 LAB — TROPONIN I (HIGH SENSITIVITY)
Troponin I (High Sensitivity): 92 ng/L — ABNORMAL HIGH (ref <18)
Troponin I (High Sensitivity): 821 ng/L (ref <18)

## 2022-03-10 LAB — SARS CORONAVIRUS 2 BY RT PCR: SARS Coronavirus 2 by RT PCR: NEGATIVE

## 2022-03-10 LAB — BRAIN NATRIURETIC PEPTIDE: B Natriuretic Peptide: 232.7 pg/mL — ABNORMAL HIGH (ref 0.0–100.0)

## 2022-03-10 LAB — PROTIME-INR
INR: 1.2 (ref 0.8–1.2)
Prothrombin Time: 15.1 seconds (ref 11.4–15.2)

## 2022-03-10 LAB — PROCALCITONIN: Procalcitonin: 3.11 ng/mL

## 2022-03-10 LAB — CBG MONITORING, ED: Glucose-Capillary: 114 mg/dL — ABNORMAL HIGH (ref 70–99)

## 2022-03-10 LAB — LACTIC ACID, PLASMA
Lactic Acid, Venous: 3 mmol/L (ref 0.5–1.9)
Lactic Acid, Venous: 1.8 mmol/L (ref 0.5–1.9)

## 2022-03-10 LAB — CORTISOL: Cortisol, Plasma: 24.3 ug/dL

## 2022-03-10 MED ORDER — LACTATED RINGERS IV BOLUS (SEPSIS)
1000.0000 mL | Freq: Once | INTRAVENOUS | Status: AC
Start: 2022-03-10 — End: 2022-03-10
  Administered 2022-03-10: 1000 mL via INTRAVENOUS

## 2022-03-10 MED ORDER — LACTATED RINGERS IV BOLUS
1000.0000 mL | Freq: Once | INTRAVENOUS | Status: AC
  Administered 2022-03-10: 1000 mL via INTRAVENOUS

## 2022-03-10 MED ORDER — DILTIAZEM HCL-DEXTROSE 125-5 MG/125ML-% IV SOLN (PREMIX)
5.0000 mg/h | INTRAVENOUS | Status: DC
  Administered 2022-03-10: 5 mg/h via INTRAVENOUS
  Filled 2022-03-10: qty 125

## 2022-03-10 MED ORDER — SODIUM CHLORIDE 0.9 % IV SOLN
250.0000 mL | INTRAVENOUS | Status: DC
  Administered 2022-03-10: 250 mL via INTRAVENOUS

## 2022-03-10 MED ORDER — IPRATROPIUM-ALBUTEROL 0.5-2.5 (3) MG/3ML IN SOLN
3.0000 mL | Freq: Four times a day (QID) | RESPIRATORY_TRACT | Status: DC
  Administered 2022-03-10 – 2022-03-11 (×3): 3 mL via RESPIRATORY_TRACT
  Filled 2022-03-10 (×3): qty 3

## 2022-03-10 MED ORDER — METHYLPREDNISOLONE SODIUM SUCC 125 MG IJ SOLR
40.0000 mg | INTRAMUSCULAR | Status: DC
  Administered 2022-03-10 – 2022-03-12 (×3): 40 mg via INTRAVENOUS
  Filled 2022-03-10 (×3): qty 2

## 2022-03-10 MED ORDER — FUROSEMIDE 40 MG PO TABS: 40.0000 mg | ORAL_TABLET | Freq: Every day | ORAL | 0 refills | Status: DC

## 2022-03-10 MED ORDER — SODIUM CHLORIDE 0.9 % IV SOLN
1.0000 g | Freq: Once | INTRAVENOUS | Status: AC
  Administered 2022-03-10: 1 g via INTRAVENOUS
  Filled 2022-03-10: qty 10

## 2022-03-10 MED ORDER — AMIODARONE LOAD VIA INFUSION
150.0000 mg | Freq: Once | INTRAVENOUS | Status: DC
  Filled 2022-03-10: qty 83.34

## 2022-03-10 MED ORDER — AZITHROMYCIN 500 MG IV SOLR
500.0000 mg | Freq: Once | INTRAVENOUS | Status: AC
  Administered 2022-03-10: 500 mg via INTRAVENOUS
  Filled 2022-03-10: qty 5

## 2022-03-10 MED ORDER — LACTATED RINGERS IV SOLN: INTRAVENOUS | Status: AC

## 2022-03-10 MED ORDER — DIGOXIN 0.25 MG/ML IJ SOLN
0.1250 mg | Freq: Four times a day (QID) | INTRAMUSCULAR | Status: AC
  Administered 2022-03-10 (×2): 0.125 mg via INTRAVENOUS
  Filled 2022-03-10 (×3): qty 0.5

## 2022-03-10 MED ORDER — INSULIN ASPART 100 UNIT/ML IJ SOLN
0.0000 [IU] | INTRAMUSCULAR | Status: DC
  Administered 2022-03-11: 8 [IU] via SUBCUTANEOUS
  Administered 2022-03-12: 5 [IU] via SUBCUTANEOUS
  Administered 2022-03-12: 3 [IU] via SUBCUTANEOUS
  Administered 2022-03-12: 5 [IU] via SUBCUTANEOUS
  Administered 2022-03-12: 2 [IU] via SUBCUTANEOUS
  Administered 2022-03-12 – 2022-03-13 (×2): 5 [IU] via SUBCUTANEOUS
  Administered 2022-03-13: 11 [IU] via SUBCUTANEOUS
  Administered 2022-03-13: 3 [IU] via SUBCUTANEOUS
  Administered 2022-03-13 (×2): 5 [IU] via SUBCUTANEOUS
  Administered 2022-03-14: 2 [IU] via SUBCUTANEOUS

## 2022-03-10 MED ORDER — CHLORHEXIDINE GLUCONATE CLOTH 2 % EX PADS
6.0000 | MEDICATED_PAD | Freq: Every day | CUTANEOUS | Status: DC
  Administered 2022-03-11: 6 via TOPICAL

## 2022-03-10 MED ORDER — ACETAMINOPHEN 650 MG RE SUPP
650.0000 mg | Freq: Once | RECTAL | Status: AC
  Administered 2022-03-10: 650 mg via RECTAL
  Filled 2022-03-10: qty 1

## 2022-03-10 MED ORDER — DOCUSATE SODIUM 100 MG PO CAPS: 100.0000 mg | ORAL_CAPSULE | Freq: Two times a day (BID) | ORAL | Status: DC | PRN

## 2022-03-10 MED ORDER — ONETOUCH ULTRA VI STRP: ORAL_STRIP | 3 refills | Status: DC

## 2022-03-10 MED ORDER — IPRATROPIUM-ALBUTEROL 0.5-2.5 (3) MG/3ML IN SOLN: 3.0000 mL | Freq: Four times a day (QID) | RESPIRATORY_TRACT | Status: DC | PRN

## 2022-03-10 MED ORDER — POLYETHYLENE GLYCOL 3350 17 G PO PACK: 17.0000 g | PACK | Freq: Every day | ORAL | Status: DC | PRN

## 2022-03-10 MED ORDER — SODIUM CHLORIDE 0.9 % IV SOLN
1.0000 g | INTRAVENOUS | Status: DC
  Administered 2022-03-10 – 2022-03-13 (×4): 1 g via INTRAVENOUS
  Filled 2022-03-10 (×4): qty 10

## 2022-03-10 MED ORDER — SODIUM CHLORIDE 0.9 % IV SOLN: INTRAVENOUS | Status: AC

## 2022-03-10 MED ORDER — AMIODARONE HCL IN DEXTROSE 360-4.14 MG/200ML-% IV SOLN
30.0000 mg/h | INTRAVENOUS | Status: DC
  Filled 2022-03-10: qty 200

## 2022-03-10 MED ORDER — PANTOPRAZOLE SODIUM 40 MG IV SOLR
40.0000 mg | Freq: Every day | INTRAVENOUS | Status: DC
  Administered 2022-03-10 – 2022-03-11 (×2): 40 mg via INTRAVENOUS
  Filled 2022-03-10 (×2): qty 10

## 2022-03-10 MED ORDER — AMIODARONE HCL IN DEXTROSE 360-4.14 MG/200ML-% IV SOLN
60.0000 mg/h | INTRAVENOUS | Status: DC
  Filled 2022-03-10: qty 200

## 2022-03-10 MED ORDER — LACTATED RINGERS IV BOLUS (SEPSIS)
1200.0000 mL | Freq: Once | INTRAVENOUS | Status: AC
  Administered 2022-03-10: 1200 mL via INTRAVENOUS

## 2022-03-10 MED ORDER — SODIUM CHLORIDE 0.9 % IV SOLN
500.0000 mg | INTRAVENOUS | Status: DC
  Administered 2022-03-10 – 2022-03-11 (×2): 500 mg via INTRAVENOUS
  Filled 2022-03-10 (×2): qty 5

## 2022-03-10 MED ORDER — PHENYLEPHRINE HCL-NACL 20-0.9 MG/250ML-% IV SOLN
25.0000 ug/min | INTRAVENOUS | Status: DC
  Administered 2022-03-10: 25 ug/min via INTRAVENOUS
  Filled 2022-03-10: qty 250

## 2022-03-10 NOTE — ED Triage Notes (Signed)
Coming from home with c/o of declining over the last couple days per son.  Family states he is supposed to wear O2 unsure how much but was not on any when ems arrived.  EMS also reports HR 170's irregular, 20 mg Cardizem given and currently 173. Initial saturation 78% RA after '10mg'$  albuterol. And Atrovent and placed on 4L the highest he got was 94% reports wheezing.

## 2022-03-10 NOTE — ED Notes (Signed)
Patient transported to CT 

## 2022-03-10 NOTE — H&P (Signed)
Edward [name], MRN:  [id number], DOB:  [birth year], LOS: 0 ADMISSION DATE:  03/10/2022, CONSULTATION DATE: 03/10/2022 REFERRING MD: Emergency department, CHIEF COMPLAINT: Hypoxia  History of Present Illness:  87 year old COPD with O2 dependency has had multiple admits in the past for hypoxia presents 03/10/2022 with altered mental status, fever, hypoxia normally on 3 L nasal cannula for essentially end-stage COPD and reportedly still smokes.  Pulmonary critical care asked to admit for these reasons.  He is barely cooperative obvious confusion sats are adequate on nasal cannula.  He will be admitted to the intensive care unit for further evaluation and treatment.  We will need to have goals of care discussion with the gentleman and his family in the future.  Pertinent  Medical History   Past Medical History:  Diagnosis Date   Allergy    Rhinitis   Atrial fibrillation ([name])    Bronchitis    Chronic respiratory failure ([name])    Colon polyps    COPD (chronic obstructive pulmonary disease) ([name])    Diabetes mellitus    Elevated lipids    Hypercholesterolemia    Hypertension    Iron deficiency anemia due to chronic blood loss 04/30/2018   Noncompliance    On home O2    2L N/C    PSA elevation    Pulmonary fibrosis ([name])    Vitamin D deficiency      Significant Hospital Events: Including procedures, antibiotic start and stop dates in addition to other pertinent events   Admitted with hypoxia failure to thrive  Interim History / Subjective:  Presented with altered mental status hypoxia & pneumonia  Objective   Blood pressure (!) 85/63, pulse (!) 152, temperature (!) 102.3 F (39.1 C), temperature source Rectal, resp. rate 18, height '5\' 5"'$  (1.651 m), weight 72 kg, SpO2 97 %.        Intake/Output Summary (Last 24 hours) at 03/10/2022 1220 Last data filed at 03/10/2022 1206 Gross per 24 hour  Intake 3345.91 ml  Output 500 ml  Net 2845.91 ml   Filed Weights   03/10/22 0936   Weight: 72 kg    Examination: General: Disheveled 87 year old HENT: No JVD or lymphadenopathy is appreciated Lungs: Coarse rhonchi with external wheezing faint crackles Cardiovascular: Heart sounds are regular ventricular rate of 133 Abdomen: Obese soft nontender Extremities: 1+ edema Neuro: Confused but will answer questions at times GU: Has not voided at this time  Resolved Hospital Problem list     Assessment & Plan:  Acute on chronic hypoxic respiratory failure with left upper lobe pneumonia and near end-stage COPD O2 dependent who reportedly is still smoking. Admitted to the intensive care unit Empirical antimicrobial therapy for community-acquired pneumonia Bronchodilators Steroids O2 as needed to keep sats greater than 88% He may need intubation in the future.  Generalized failure to thrive and O2 dependent end-stage COPD or with multiple admissions and continued decline in his mental status Needs goals of care discussion  Atrial fibs with rapid ventricular response he becomes hypotensive with diltiazem and metoprolol. No anticoagulation at this time Beta-blockers as needed May need Neo-Synephrine drip peripherally for hypotension I suspect he may be on the dry side Admission to the intensive care unit Consider cardiology consult   Altered mental status may be secondary to metabolic disarray. CT of the head contrast  DM CBG (last 3)  No results for input(s): "GLUCAP" in the last 72 hours.   Renal insufficiency Lab Results  Component Value Date  CREATININE 2.61 (H) 03/10/2022   CREATININE 1.30 (H) 02/07/2022   CREATININE 1.20 09/05/2021   CREATININE 1.70 (H) 07/07/2021   CREATININE 1.36 (H) 03/30/2021   CREATININE 1.67 (H) 03/24/2021  Monitor Avoid nephrotoxins Consider gentle hydration  History of congestive heart failure EF 65% 04/17/2020 Consider repeat echo Consider cardiology consult for atrial fibrillation ventricular response   Best  Practice (right click and "Reselect all SmartList Selections" daily)   Diet/type: Regular consistency (see orders) DVT prophylaxis: systemic heparin GI prophylaxis: PPI Lines: N/A Foley:  N/A Code Status:  full code Last date of multidisciplinary goals of care discussion [tbd]  Labs   CBC: Recent Labs  Lab 03/10/22 0930 03/10/22 1019  WBC 18.5*  --   NEUTROABS 13.8*  --   HGB 12.3* 11.9*  HCT 38.6* 35.0*  MCV 87.1  --   PLT 259  --     Basic Metabolic Panel: Recent Labs  Lab 03/10/22 0930 03/10/22 1019  NA 135 131*  K 5.1 5.6*  CL 95*  --   CO2 24  --   GLUCOSE 115*  --   BUN 62*  --   CREATININE 2.61*  --   CALCIUM 8.8*  --   MG 2.4  --    GFR: Estimated Creatinine Clearance: 18.3 mL/min (A) (by C-G formula based on SCr of 2.61 mg/dL (H)). Recent Labs  Lab 03/10/22 0930  WBC 18.5*  LATICACIDVEN 3.0*    Liver Function Tests: Recent Labs  Lab 03/10/22 0930  AST 42*  ALT 18  ALKPHOS 81  BILITOT 0.6  PROT 6.0*  ALBUMIN 2.6*   No results for input(s): "LIPASE", "AMYLASE" in the last 168 hours. No results for input(s): "AMMONIA" in the last 168 hours.  ABG    Component Value Date/Time   PHART 7.460 (H) 04/14/2020 2100   PCO2ART 47.6 04/14/2020 2100   PO2ART 57.5 (L) 04/14/2020 2100   HCO3 30.9 (H) 03/10/2022 1019   TCO2 33 (H) 03/10/2022 1019   ACIDBASEDEF 0.0 03/04/2017 1815   O2SAT 84 03/10/2022 1019     Coagulation Profile: Recent Labs  Lab 03/10/22 0930  INR 1.2    Cardiac Enzymes: No results for input(s): "CKTOTAL", "CKMB", "CKMBINDEX", "TROPONINI" in the last 168 hours.  HbA1C: Hgb A1C (fingerstick)  Date/Time Value Ref Range Status  12/01/2015 08:31 AM >14.0 (H) <5.7 % Final    Comment:                                                                           According to the ADA Clinical Practice Recommendations for 2011, when HbA1c is used as a screening test:     >=6.5%   Diagnostic of Diabetes Mellitus            (if  abnormal result is confirmed)   5.7-6.4%   Increased risk of developing Diabetes Mellitus   References:Diagnosis and Classification of Diabetes Mellitus,Diabetes GUYQ,0347,42(VZDGL 1):S62-S69 and Standards of Medical Care in         Diabetes - 2011,Diabetes OVFI,4332,95 (Suppl 1):S11-S61.     05/27/2015 08:54 AM >14.0 (H) <5.7 % Final    Comment:  According to the ADA Clinical Practice Recommendations for 2011, when HbA1c is used as a screening test:     >=6.5%   Diagnostic of Diabetes Mellitus            (if abnormal result is confirmed)   5.7-6.4%   Increased risk of developing Diabetes Mellitus   References:Diagnosis and Classification of Diabetes Mellitus,Diabetes CBSW,9675,91(MBWGY 1):S62-S69 and Standards of Medical Care in         Diabetes - 2011,Diabetes KZLD,3570,17 (Suppl 1):S11-S61.      Hgb A1c MFr Bld  Date/Time Value Ref Range Status  09/05/2021 08:20 AM >14.0 (H) <5.7 % of total Hgb Final    Comment:    Verified by repeat analysis. . For someone without known diabetes, a hemoglobin A1c value of 6.5% or greater indicates that they may have  diabetes and this should be confirmed with a follow-up  test. . For someone with known diabetes, a value <7% indicates  that their diabetes is well controlled and a value  greater than or equal to 7% indicates suboptimal  control. A1c targets should be individualized based on  duration of diabetes, age, comorbid conditions, and  other considerations. . Currently, no consensus exists regarding use of hemoglobin A1c for diagnosis of diabetes for children. Marland Kitchen   03/24/2021 12:33 PM 12.6 (H) <5.7 % of total Hgb Final    Comment:    For someone without known diabetes, a hemoglobin A1c value of 6.5% or greater indicates that they may have  diabetes and this should be confirmed with a follow-up  test. . For someone with known diabetes, a value <7%  indicates  that their diabetes is well controlled and a value  greater than or equal to 7% indicates suboptimal  control. A1c targets should be individualized based on  duration of diabetes, age, comorbid conditions, and  other considerations. . Currently, no consensus exists regarding use of hemoglobin A1c for diagnosis of diabetes for children. .     CBG: No results for input(s): "GLUCAP" in the last 168 hours.  Review of Systems:   na  Past Medical History:  He,  has a past medical history of Allergy, Atrial fibrillation ([place]), Bronchitis, Chronic respiratory failure ([place]), Colon polyps, COPD (chronic obstructive pulmonary disease) ([place]), Diabetes mellitus, Elevated lipids, Hypercholesterolemia, Hypertension, Iron deficiency anemia due to chronic blood loss (04/30/2018), Noncompliance, On home O2, PSA elevation, Pulmonary fibrosis ([place]), and Vitamin D deficiency.   Surgical History:   Past Surgical History:  Procedure Laterality Date   BIOPSY  04/23/2018   Procedure: BIOPSY;  Surgeon: Danie Binder, MD;  [facility];  Service: Endoscopy;;  duodenum gastric   CATARACT EXTRACTION W/PHACO  06/25/2012   Procedure: CATARACT EXTRACTION PHACO AND INTRAOCULAR LENS PLACEMENT ([place]);  Surgeon: Elta Guadeloupe T. Gershon Crane, MD;  Location: [facility];  Service: Ophthalmology;  Laterality: Left;  CDE=19.01   CATARACT EXTRACTION W/PHACO  07/09/2012   Procedure: CATARACT EXTRACTION PHACO AND INTRAOCULAR LENS PLACEMENT ([facility]);  Surgeon: Elta Guadeloupe T. Gershon Crane, MD;  Location: [facility];  Service: Ophthalmology;  Laterality: Right;  CDE: 20.09   COLONOSCOPY WITH PROPOFOL N/A 04/23/2018   Procedure: COLONOSCOPY WITH PROPOFOL;  Surgeon: Danie Binder, MD;  Location[facility];  Service: Endoscopy;  Laterality: N/A;   ESOPHAGOGASTRODUODENOSCOPY (EGD) WITH PROPOFOL N/A 04/23/2018   Procedure: ESOPHAGOGASTRODUODENOSCOPY (EGD) WITH PROPOFOL;  Surgeon: Danie Binder, MD;  Location[facility];  Service:  Endoscopy;  Laterality: N/A;     Social History:  reports that he quit smoking about 9 years ago. His smoking use included cigarettes. He has a 90.00 pack-year smoking history. He has never used smokeless tobacco. He reports that he does not drink alcohol and does not use drugs.   Family History:  His family history includes CAD in an other family member; Diabetes in an other family member; Heart disease in his mother.   Allergies Allergies  Allergen Reactions   Ace Inhibitors Other (See Comments)    Hyperkalemia--07/23/2013:patient states not familiar with the following allergy     Home Medications  Prior to Admission medications   Medication Sig Start Date End Date Taking? Authorizing Provider  acetaminophen (TYLENOL) 500 MG tablet Take 500 mg by mouth every 6 (six) hours as needed for headache.    [provider]  albuterol (PROVENTIL) (2.5 MG/3ML) 0.083% nebulizer solution INHALE 3ML BY NEBULIZATION ROUTE EVERY 6 HOURS AS NEEDED FOR WHEEZING OR SHORTNESS OF BREATH 12/09/21   Susy Frizzle, MD  cloNIDine (CATAPRES) 0.1 MG tablet TAKE 1 TABLET BY MOUTH TWICE DAILY 01/04/22   Susy Frizzle, MD  ELIQUIS 5 MG TABS tablet Take 1 tablet by mouth twice daily 03/05/20   Susy Frizzle, MD  fluticasone-salmeterol [facility] INHUB) 500-50 MCG/ACT AEPB Inhale 1 puff into the lungs in the morning and at bedtime. This replaces symbicort, 02/10/22   Susy Frizzle, MD  furosemide (LASIX) 40 MG tablet TAKE 1 TABLET BY MOUTH DAILY 12/09/21   Susy Frizzle, MD  gabapentin (NEURONTIN) 100 MG capsule TAKE 1 CAPSULE BY MOUTH THREE TIMES DAILY 09/15/20   Susy Frizzle, MD  insulin glargine (LANTUS SOLOSTAR) 100 UNIT/ML Solostar Pen Inject 45 Units into the skin at bedtime. 11/23/21   Susy Frizzle, MD  ipratropium-albuterol (DUONEB) 0.5-2.5 (3) MG/3ML SOLN Take 3 mLs by nebulization every 6 (six) hours as needed. 12/30/19   Susy Frizzle, MD  Lancets (ONETOUCH DELICA PLUS  [id number]) MISC USE TO CHECK BLOOD SUGAR TWICE DAILY AS DIRECTED 12/08/20   Susy Frizzle, MD  Lidocaine (HM LIDOCAINE PATCH) 4 % PTCH Apply 1 patch topically daily. 03/30/21   Eulogio Bear, NP  losartan (COZAAR) 25 MG tablet Take 1 tablet (25 mg total) by mouth daily. 08/03/20   Susy Frizzle, MD  metoprolol tartrate (LOPRESSOR) 25 MG tablet TAKE 1 TABLET BY MOUTH TWICE DAILY 10/18/21   Susy Frizzle, MD  [facility] ULTRA test strip CHECK FASTING BLOOD SUGAR TWICE DAILY 02/22/21   Susy Frizzle, MD  OXYGEN Inhale 3 L into the lungs continuous.     [provider]  pioglitazone (ACTOS) 30 MG tablet Take 1 tablet (30 mg total) by mouth daily. 09/05/21   Susy Frizzle, MD  potassium chloride SA (KLOR-CON) 20 MEQ tablet Take 1 tablet by mouth once daily 03/01/21   Susy Frizzle, MD  pravastatin (PRAVACHOL) 80 MG tablet TAKE 1 TABLET BY MOUTH AT BEDTIME 01/04/22   Susy Frizzle, MD  predniSONE (DELTASONE) 20 MG tablet Take 2 tablets (40 mg total) by mouth daily with breakfast. For the next four days 02/07/22   Carmin Muskrat, MD  pregabalin (LYRICA) 100 MG capsule Take 1 capsule (100 mg total) by mouth 2 (two) times daily. 12/16/21   Susy Frizzle, MD  tamsulosin (FLOMAX) 0.4 MG CAPS capsule Take 1 capsule (0.4 mg total) by mouth daily. 05/14/20   Susy Frizzle, MD  traMADol (ULTRAM) 50 MG tablet Take 1 tablet (50 mg total)  by mouth every 12 (twelve) hours as needed. 02/07/22   Susy Frizzle, MD     Critical care time: 23 min    Richardson Landry Shawnta Zimbelman ACNP Acute Care Nurse Practitioner [place] Please consult Amion 03/10/2022, 12:21 PM

## 2022-03-10 NOTE — Sepsis Progress Note (Signed)
Code Sepsis protocol being monitored by eLink. 

## 2022-03-10 NOTE — ED Notes (Signed)
RT notified to draw ABG 

## 2022-03-10 NOTE — Telephone Encounter (Signed)
Receive a call from [facility] requesting a refill on two medications. furosemide (LASIX) 40 MG tablet [[id number]]    Order Details Dose, Route, Frequency: As Directed  Dispense Quantity: 30 tablet Refills: 3        Sig: TAKE 1 TABLET BY MOUTH DAILY       Start Date: 12/09/21 End Date: --  Written Date: 12/09/21 Expiration Date: 12/09/22     Associated Diagnoses: Hospital discharge follow-up [Z09]; Chronic diastolic heart failure ([place]) [I50.32]; Bilateral lower extremity edema [R60.0]  Original Order:  furosemide (LASIX) 40 MG tablet [[id number]]  ONETOUCH ULTRA test strip [[id number]]    Order Details Dose, Route, Frequency: As Directed  Dispense Quantity: 50 strip Refills: 3        Sig: CHECK FASTING BLOOD SUGAR TWICE DAILY       Start Date: 02/22/21 End Date: --  Written Date: 02/22/21 Expiration Date: 02/22/22  Original Order:  [name] ULTRA test strip [[id number]]

## 2022-03-10 NOTE — ED Notes (Signed)
Edward [name](Son) of Edward [name] called asking for an update. His number is [phone].

## 2022-03-10 NOTE — Progress Notes (Signed)
[place] Progress Note Patient Name: Edward [name] DOB: Sep 05, 1935 MRN: [id number]   Date of Service  03/10/2022  HPI/Events of Note  Patient was in atrial fibrillation with RVR then spontaneously terminated before he received any Amiodarone, he is currently in sinus rhythm with a normal rate, last Mg++ was 2.4, and last K+ 5.1.  eICU Interventions  Amiodarone bolus + gtt discontinued for now, patient will be monitored closely for recurrence of atrial fibrillation with RVR.        [name] 03/10/2022, 7:48 PM

## 2022-03-10 NOTE — Progress Notes (Signed)
An USGPIV (ultrasound guided PIV) 22G x 1.75" on RAFA has been placed for short-term vasopressor infusion. A correctly placed ivWatch must be used when administering Vasopressors. Should this treatment be needed beyond 72 hours, central line access should be obtained.  It will be the responsibility of the bedside nurse to follow best practice to prevent extravasations.

## 2022-03-10 NOTE — Assessment & Plan Note (Addendum)
Advanced COPD on home oxygen, current smoker. Multiple admissions  AMS and fever.  ABG shows mild hypoxia but no hypercarbia.  No distress on exam, diffuse wheezing.  - Monitor respiratory status and discuss goals of care as would be at high risk for prolonged mechanical ventilation. -  Add steroids for 5 days.

## 2022-03-10 NOTE — ED Notes (Signed)
Lab called for critical troponin 821, Dr. Caroline Sauger notified via secure chat.

## 2022-03-10 NOTE — Progress Notes (Signed)
Duoneb given at this time per MD. MD aware of HR.

## 2022-03-10 NOTE — TOC Progression Note (Signed)
Transition of Care [facility]) - Progression Note    Patient Details  Name: Edward [name] MRN: [id number] Date of Birth: 04/29/1935  Transition of Care [facility]) CM/SW [place], RN Phone Number:[phone]  03/10/2022, 3:10 PM  Clinical Narrative:    TOC following patient presents  with altered mental status, fever, hypoxia baseline on 3 L nasal cannula. End-stage COPD & confusion. Currently there are no TOC needs. TOC acknowledges potential disposition needs Transition of Care (TOC) Screening Note   Patient Details  Name: Edward [name] Date of Birth: September 15, 1935   Transition of Care [facility]) CM/SW Contact:    Angelita Ingles, RN Phone Number: 03/10/2022, 3:12 PM    Transition of Care Department [facility]) has reviewed patient and no TOC needs have been identified at this time. We will continue to monitor patient advancement through interdisciplinary progression rounds.           Expected Discharge Plan and Services                                                 Social Determinants of Health (SDOH) Interventions    Readmission Risk Interventions    04/20/2020    2:57 PM 04/19/2020    4:27 PM 04/19/2020   11:18 AM  Readmission Risk Prevention Plan  Transportation Screening   Complete  Medication Review (RN Care Manager)   Complete  PCP or Specialist appointment within 3-5 days of discharge Not Complete    PCP/Specialist Appt Not Complete comments appointment is scheduled for 04/26/20 which is six days. Labs to are to be drawn every Monday per attending request.    [place] or [place]  Complete   SW Recovery Care/Counseling Consult   Complete  Palliative Care Screening   Not Applicable

## 2022-03-10 NOTE — Assessment & Plan Note (Addendum)
Creatinine 2.61 (baseline 1.2) likely hypovolemic given concurrent hyponatremia. Appears volume contracted on examination.  -Trend creatinine.  Should improve with fluid resuscitation. -No acute indications for dialysis.  Would be poor dialysis candidate given age and underlying comorbidities

## 2022-03-10 NOTE — Assessment & Plan Note (Signed)
Presents with AMS and fever, leukocytosis and new left upper lobe infiltrate  -7 days ceftriaxone and azithromycin for community-acquired pneumonia.

## 2022-03-10 NOTE — ED Notes (Signed)
MD at bedside for BP results

## 2022-03-10 NOTE — Telephone Encounter (Signed)
Requested Prescriptions  Pending Prescriptions Disp Refills  . furosemide (LASIX) 40 MG tablet 30 tablet 3    Sig: Take 1 tablet (40 mg total) by mouth daily.     Cardiovascular:  Diuretics - Loop Failed - 03/10/2022 12:07 PM      Failed - K in normal range and within 180 days    Potassium  Date Value Ref Range Status  03/10/2022 4.3 3.5 - 5.1 mmol/L Final         Failed - Ca in normal range and within 180 days    Calcium  Date Value Ref Range Status  03/10/2022 8.8 (L) 8.9 - 10.3 mg/dL Final   Calcium, Ion  Date Value Ref Range Status  03/10/2022 1.18 1.15 - 1.40 mmol/L Final         Failed - Na in normal range and within 180 days    Sodium  Date Value Ref Range Status  03/10/2022 133 (L) 135 - 145 mmol/L Final         Failed - Cr in normal range and within 180 days    Creat  Date Value Ref Range Status  09/05/2021 1.20 0.70 - 1.22 mg/dL Final   Creatinine, Ser  Date Value Ref Range Status  03/10/2022 2.61 (H) 0.61 - 1.24 mg/dL Final   Creatinine, Urine  Date Value Ref Range Status  05/12/2014 68.0 mg/dL Final         Failed - Cl in normal range and within 180 days    Chloride  Date Value Ref Range Status  03/10/2022 95 (L) 98 - 111 mmol/L Final         Failed - Last BP in normal range    BP Readings from Last 1 Encounters:  03/10/22 (!) 81/58         Passed - Mg Level in normal range and within 180 days    Magnesium  Date Value Ref Range Status  03/10/2022 2.4 1.7 - 2.4 mg/dL Final    Comment:    Performed at [facility], [place] [address] [place], [place] [address]         Passed - Valid encounter within last 6 months    Recent Outpatient Visits          4 weeks ago COPD with acute exacerbation ([place])   [place] Susy Frizzle, MD   6 months ago Uncontrolled type 2 diabetes mellitus with hyperglycemia ([place])   [place] Susy Frizzle, MD   7 months ago COPD with acute exacerbation ([place])   [place] [facility] Susy Frizzle, MD   8 months ago Uncontrolled type 2 diabetes mellitus with hyperglycemia ([place])   [place] [name], [name], MD   8 months ago Confusion   [place] Dennard Schaumann, [name], MD             . glucose blood (ONETOUCH ULTRA) test strip 50 strip 3    Sig: CHECK FASTING BLOOD SUGAR TWICE DAILY     Endocrinology: Diabetes - Testing Supplies Passed - 03/10/2022 12:07 PM      Passed - Valid encounter within last 12 months    Recent Outpatient Visits          4 weeks ago COPD with acute exacerbation ([place])   [facility] Susy Frizzle, MD   6 months ago Uncontrolled type 2 diabetes mellitus with hyperglycemia ([place])  [place] [name], [name], MD   7 months ago COPD with acute exacerbation [facility])   [facility] Susy Frizzle, MD   8 months ago Uncontrolled type 2 diabetes mellitus with hyperglycemia [facility])   [place] [name], [name], MD   8 months ago Confusion   [place] [name], [name], MD

## 2022-03-10 NOTE — ED Notes (Signed)
MD aware of BP dropping verbal given to stop cardizem.  Unable to locate RN caring for the pt will make him aware when comes back

## 2022-03-10 NOTE — ED Provider Notes (Signed)
[facility] EMERGENCY DEPARTMENT Provider Note   CSN: [id number] Arrival date & time: 03/10/22  0912     History  Chief Complaint  Patient presents with   Altered Mental Status    Edward [name] is a 87 y.o. male.  He is brought in by family from home.  Level 5 caveat secondary to altered mental status.  Apparently lives with family and he was to be using oxygen.  Family reported decline basic ability to care for self and ambulate. Unsure if taking his home meds. Sounds like over a few days.  EMS found to be tachycardic tachypneic hypoxic with sats in the 70s off of oxygen.  Given Cardizem 20 IV, fluids and breathing treatment with some improvement in his saturations.  Patient unable to answer any questions other than yes no and seems inconsistent with that.  The history is provided by the patient and the EMS personnel.  Altered Mental Status Presenting symptoms: lethargy   Most recent episode:  2 days ago Episode history:  Continuous Timing:  Constant Progression:  Unchanged Chronicity:  New      Home Medications Prior to Admission medications   Medication Sig Start Date End Date Taking? Authorizing Provider  acetaminophen (TYLENOL) 500 MG tablet Take 500 mg by mouth every 6 (six) hours as needed for headache.    [provider]  albuterol (PROVENTIL) (2.5 MG/3ML) 0.083% nebulizer solution INHALE 3ML BY NEBULIZATION ROUTE EVERY 6 HOURS AS NEEDED FOR WHEEZING OR SHORTNESS OF BREATH 12/09/21   Susy Frizzle, MD  cloNIDine (CATAPRES) 0.1 MG tablet TAKE 1 TABLET BY MOUTH TWICE DAILY 01/04/22   Susy Frizzle, MD  ELIQUIS 5 MG TABS tablet Take 1 tablet by mouth twice daily 03/05/20   Susy Frizzle, MD  fluticasone-salmeterol [name] INHUB) 500-50 MCG/ACT AEPB Inhale 1 puff into the lungs in the morning and at bedtime. This replaces symbicort, 02/10/22   Susy Frizzle, MD  furosemide (LASIX) 40 MG tablet TAKE 1 TABLET BY MOUTH DAILY 12/09/21    Susy Frizzle, MD  gabapentin (NEURONTIN) 100 MG capsule TAKE 1 CAPSULE BY MOUTH THREE TIMES DAILY 09/15/20   Susy Frizzle, MD  insulin glargine (LANTUS SOLOSTAR) 100 UNIT/ML Solostar Pen Inject 45 Units into the skin at bedtime. 11/23/21   Susy Frizzle, MD  ipratropium-albuterol (DUONEB) 0.5-2.5 (3) MG/3ML SOLN Take 3 mLs by nebulization every 6 (six) hours as needed. 12/30/19   Susy Frizzle, MD  Lancets (ONETOUCH DELICA PLUS [id number]) MISC USE TO CHECK BLOOD SUGAR TWICE DAILY AS DIRECTED 12/08/20   Susy Frizzle, MD  Lidocaine (HM LIDOCAINE PATCH) 4 % PTCH Apply 1 patch topically daily. 03/30/21   Eulogio Bear, NP  losartan (COZAAR) 25 MG tablet Take 1 tablet (25 mg total) by mouth daily. 08/03/20   Susy Frizzle, MD  metoprolol tartrate (LOPRESSOR) 25 MG tablet TAKE 1 TABLET BY MOUTH TWICE DAILY 10/18/21   Susy Frizzle, MD  [facility] ULTRA test strip CHECK FASTING BLOOD SUGAR TWICE DAILY 02/22/21   Susy Frizzle, MD  OXYGEN Inhale 3 L into the lungs continuous.     [provider]  pioglitazone (ACTOS) 30 MG tablet Take 1 tablet (30 mg total) by mouth daily. 09/05/21   Susy Frizzle, MD  potassium chloride SA (KLOR-CON) 20 MEQ tablet Take 1 tablet by mouth once daily 03/01/21   Susy Frizzle, MD  pravastatin (PRAVACHOL) 80 MG tablet TAKE 1 TABLET BY  MOUTH AT BEDTIME 01/04/22   Susy Frizzle, MD  predniSONE (DELTASONE) 20 MG tablet Take 2 tablets (40 mg total) by mouth daily with breakfast. For the next four days 02/07/22   Carmin Muskrat, MD  pregabalin (LYRICA) 100 MG capsule Take 1 capsule (100 mg total) by mouth 2 (two) times daily. 12/16/21   Susy Frizzle, MD  tamsulosin (FLOMAX) 0.4 MG CAPS capsule Take 1 capsule (0.4 mg total) by mouth daily. 05/14/20   Susy Frizzle, MD  traMADol (ULTRAM) 50 MG tablet Take 1 tablet (50 mg total) by mouth every 12 (twelve) hours as needed. 02/07/22   Susy Frizzle, MD      Allergies    Ace  inhibitors    Review of Systems   Review of Systems  Unable to perform ROS: Mental status change    Physical Exam Updated Vital Signs BP 90/62   Pulse (!) 146   Temp 99.8 F (37.7 C) (Axillary)   Resp (!) 29   SpO2 90%  Physical Exam Vitals and nursing note reviewed.  Constitutional:      General: He is in acute distress.     Appearance: He is well-developed.  HENT:     Head: Normocephalic and atraumatic.  Eyes:     Conjunctiva/sclera: Conjunctivae normal.  Cardiovascular:     Rate and Rhythm: Tachycardia present. Rhythm irregular.     Heart sounds: No murmur heard. Pulmonary:     Effort: Tachypnea and accessory muscle usage present. No respiratory distress.     Breath sounds: Rhonchi present.  Abdominal:     General: There is distension.     Palpations: Abdomen is soft.     Tenderness: There is no abdominal tenderness. There is no guarding or rebound.  Musculoskeletal:        General: No swelling.     Cervical back: Neck supple.     Right lower leg: No edema.     Left lower leg: No edema.  Skin:    General: Skin is warm and dry.     Capillary Refill: Capillary refill takes less than 2 seconds.  Neurological:     General: No focal deficit present.     Mental Status: He is alert.     Comments: Patient is awake.  Inconsistent answering questions.  No obvious facial droop.  Moves all extremities but not really compliant with neurologic exam.     ED Results / Procedures / Treatments   Labs (all labs ordered are listed, but only abnormal results are displayed) Labs Reviewed  LACTIC ACID, PLASMA - Abnormal; Notable for the following components:      Result Value   Lactic Acid, Venous 3.0 (*)    All other components within normal limits  COMPREHENSIVE METABOLIC PANEL - Abnormal; Notable for the following components:   Chloride 95 (*)    Glucose, Bld 115 (*)    BUN 62 (*)    Creatinine, Ser 2.61 (*)    Calcium 8.8 (*)    Total Protein 6.0 (*)    Albumin 2.6 (*)     AST 42 (*)    GFR, Estimated 23 (*)    Anion gap 16 (*)    All other components within normal limits  CBC WITH DIFFERENTIAL/PLATELET - Abnormal; Notable for the following components:   WBC 18.5 (*)    Hemoglobin 12.3 (*)    HCT 38.6 (*)    Neutro Abs 13.8 (*)    Monocytes Absolute 1.6 (*)  Abs Immature Granulocytes 1.39 (*)    All other components within normal limits  URINALYSIS, ROUTINE W REFLEX MICROSCOPIC - Abnormal; Notable for the following components:   APPearance CLOUDY (*)    Glucose, UA >=500 (*)    Hgb urine dipstick SMALL (*)    Protein, ur >=300 (*)    Bacteria, UA MANY (*)    All other components within normal limits  BRAIN NATRIURETIC PEPTIDE - Abnormal; Notable for the following components:   B Natriuretic Peptide 232.7 (*)    All other components within normal limits  HEMOGLOBIN A1C - Abnormal; Notable for the following components:   Hgb A1c MFr Bld 13.6 (*)    All other components within normal limits  GLUCOSE, CAPILLARY - Abnormal; Notable for the following components:   Glucose-Capillary 101 (*)    All other components within normal limits  I-STAT VENOUS BLOOD GAS, ED - Abnormal; Notable for the following components:   pO2, Ven 53 (*)    Bicarbonate 30.9 (*)    TCO2 33 (*)    Acid-Base Excess 4.0 (*)    Sodium 131 (*)    Potassium 5.6 (*)    Calcium, Ion 1.11 (*)    HCT 35.0 (*)    Hemoglobin 11.9 (*)    All other components within normal limits  I-STAT ARTERIAL BLOOD GAS, ED - Abnormal; Notable for the following components:   pO2, Arterial 74 (*)    Bicarbonate 28.3 (*)    Acid-Base Excess 3.0 (*)    Sodium 133 (*)    HCT 31.0 (*)    Hemoglobin 10.5 (*)    All other components within normal limits  CBG MONITORING, ED - Abnormal; Notable for the following components:   Glucose-Capillary 114 (*)    All other components within normal limits  TROPONIN I (HIGH SENSITIVITY) - Abnormal; Notable for the following components:   Troponin I (High  Sensitivity) 92 (*)    All other components within normal limits  TROPONIN I (HIGH SENSITIVITY) - Abnormal; Notable for the following components:   Troponin I (High Sensitivity) 821 (*)    All other components within normal limits  SARS CORONAVIRUS 2 BY RT PCR  CULTURE, BLOOD (ROUTINE X 2)  CULTURE, BLOOD (ROUTINE X 2)  URINE CULTURE  LACTIC ACID, PLASMA  PROTIME-INR  APTT  MAGNESIUM  PROCALCITONIN  CORTISOL  LEGIONELLA PNEUMOPHILA SEROGP 1 UR AG  BLOOD GAS, ARTERIAL  PROCALCITONIN  CBC WITH DIFFERENTIAL/PLATELET  COMPREHENSIVE METABOLIC PANEL    EKG EKG Interpretation  Date/Time:  Friday March 10 2022 13:39:45 EDT Ventricular Rate:  140 PR Interval:    QRS Duration: 73 QT Interval:  294 QTC Calculation: 449 R Axis:   59 Text Interpretation: Atrial fibrillation with rapid V-rate Ventricular premature complex Low voltage, extremity leads Nonspecific T abnormalities, lateral leads improved rate and ischemic changes from prior today Confirmed by Aletta Edouard [phone]) on 03/10/2022 1:45:03 PM  Radiology DG Chest Port 1 View  Result Date: 03/10/2022 CLINICAL DATA:  Questionable sepsis EXAM: PORTABLE CHEST 1 VIEW COMPARISON:  02/07/2022 FINDINGS: Left upper lobe and to lesser extent left lower lobe airspace disease most concerning for multilobar pneumonia. No pleural effusion or pneumothorax. Stable cardiomediastinal silhouette. No acute osseous abnormality. IMPRESSION: 1. Left upper lobe and to lesser extent left lower lobe airspace disease most concerning for multilobar pneumonia. Electronically Signed   By: Kathreen Devoid M.D.   On: 03/10/2022 09:42    Procedures .Critical Care  Performed by: Hayden Rasmussen,  MD Authorized by: Hayden Rasmussen, MD   Critical care provider statement:    Critical care time (minutes):  90   Critical care time was exclusive of:  Separately billable procedures and treating other patients   Critical care was necessary to treat or prevent imminent  or life-threatening deterioration of the following conditions:  Circulatory failure, respiratory failure, sepsis and shock   Critical care was time spent personally by me on the following activities:  Development of treatment plan with patient or surrogate, discussions with consultants, evaluation of patient's response to treatment, examination of patient, obtaining history from patient or surrogate, ordering and performing treatments and interventions, ordering and review of laboratory studies, ordering and review of radiographic studies, pulse oximetry, re-evaluation of patient's condition and review of old charts   I assumed direction of critical care for this patient from another provider in my specialty: no       Medications Ordered in ED Medications  lactated ringers infusion ( Intravenous New Bag/Given 03/10/22 1454)  insulin aspart (novoLOG) injection 0-15 Units ( Subcutaneous Not Given 03/10/22 1442)  docusate sodium (COLACE) capsule 100 mg (has no administration in time range)  polyethylene glycol (MIRALAX / GLYCOLAX) packet 17 g (has no administration in time range)  pantoprazole (PROTONIX) injection 40 mg (has no administration in time range)  0.9 %  sodium chloride infusion ( Intravenous Not Given 03/10/22 1500)  0.9 %  sodium chloride infusion (250 mLs Intravenous New Bag/Given 03/10/22 1715)  phenylephrine (NEO-SYNEPHRINE) '20mg'$ /NS 278m premix infusion (25 mcg/min Intravenous New Bag/Given 03/10/22 1401)  ipratropium-albuterol (DUONEB) 0.5-2.5 (3) MG/3ML nebulizer solution 3 mL (3 mLs Nebulization Given 03/10/22 1506)  ipratropium-albuterol (DUONEB) 0.5-2.5 (3) MG/3ML nebulizer solution 3 mL (has no administration in time range)  cefTRIAXone (ROCEPHIN) 1 g in sodium chloride 0.9 % 100 mL IVPB (has no administration in time range)  azithromycin (ZITHROMAX) 500 mg in sodium chloride 0.9 % 250 mL IVPB (500 mg Intravenous New Bag/Given 03/10/22 1721)  methylPREDNISolone sodium succinate  (SOLU-MEDROL) 125 mg/2 mL injection 40 mg (40 mg Intravenous Given 03/10/22 1716)  digoxin (LANOXIN) 0.25 MG/ML injection 0.125 mg (0.125 mg Intravenous Given 03/10/22 1722)  lactated ringers bolus 1,000 mL (0 mLs Intravenous Stopped 03/10/22 1024)  cefTRIAXone (ROCEPHIN) 1 g in sodium chloride 0.9 % 100 mL IVPB (0 g Intravenous Stopped 03/10/22 1024)  azithromycin (ZITHROMAX) 500 mg in sodium chloride 0.9 % 250 mL IVPB (0 mg Intravenous Stopped 03/10/22 1206)  acetaminophen (TYLENOL) suppository 650 mg (650 mg Rectal Given 03/10/22 1030)  lactated ringers bolus 1,200 mL (0 mLs Intravenous Stopped 03/10/22 1206)  lactated ringers bolus 1,000 mL (0 mLs Intravenous Stopped 03/10/22 1342)    ED Course/ Medical Decision Making/ A&P Clinical Course as of 03/10/22 1814  Fri Mar 10, 2022  0938 Chest x-ray ordered and interpreted by me as probable new left upper lobe pneumonia.  Awaiting radiology reading. [MB]  0950 Currently blood pressure low heart rate elevated.  Would like to get some rate control and him the blood pressure would likely not supported at the moment.  IV fluids infusing.  Antibiotics ordered. [MB]  1008 Rectal temp 102.3.  Code sepsis activated and additional fluids ordered. [MB]  1025 VBG with normal pH.  Chemistries with new AKI.  First troponin elevated at 92.  Will need to be trended. [MB]  16712Last cardiac echo 7/21 showed EF of 70%. [MB]  14580Discussed with [facility] hospitalist who felt that the patient's vital signs to unstable.  Discussed with critical care who will evaluate the department for possible admission. [MB]  3846 Patient has been placed on critical care service.  Troponin has risen to 821. [MB]    Clinical Course User Index [MB] Hayden Rasmussen, MD                           Medical Decision Making Amount and/or Complexity of Data Reviewed Labs: ordered. Radiology: ordered.  Risk OTC drugs. Prescription drug management. Decision regarding hospitalization.   Zacharey CHANCEY RINGEL was evaluated in Emergency Department on 03/10/2022 for the symptoms described in the history of present illness. He was evaluated in the context of the global COVID-19 pandemic, which necessitated consideration that the patient might be at risk for infection with the SARS-CoV-2 virus that causes COVID-19. Institutional protocols and algorithms that pertain to the evaluation of patients at risk for COVID-19 are in a state of rapid change based on information released by regulatory bodies including the CDC and federal and state organizations. These policies and algorithms were followed during the patient's care in the ED.  This patient complains of altered mental status respiratory distress fever hypotension tachycardia; this involves an extensive number of treatment Options and is a complaint that carries with it a high risk of complications and morbidity. The differential includes A-fib with RVR, sepsis, shock, pneumonia, stroke, bleed, metabolic derangement, dehydration, ACS  I ordered, reviewed and interpreted labs, which included CBC with elevated white count for infection, hemoglobin low stable from priors, chemistries with new AKI, troponins elevated and rising, VBG without significant acidosis urinalysis without clear signs of infection, lactate elevated and cleared I ordered medication IV fluids IV antibiotics, IV rate control and reviewed PMP when indicated. I ordered imaging studies which included chest x-ray and I independently    visualized and interpreted imaging which showed left upper lobe pneumonia Additional history obtained from EMS Previous records obtained and reviewed in epic including recent ED visit last month and PCP visit I consulted [facility] hospitalist and critical care team and discussed lab and imaging findings and discussed disposition.  Cardiac monitoring reviewed, A-fib with RVR Social determinants considered, patient is physically inactive Critical Interventions:  Initiation of fluids and antibiotics for sepsis, initiation of rate control for A-fib with RVR  After the interventions stated above, I reevaluated the patient and found patient to be somewhat more comfortable and is breathing.  Still remains with low blood pressures though and cannot increase rate control with his hypotension. Admission and further testing considered, patient need admission to the hospital for further management of his A-fib with RVR, hypotension, pneumonia/COPD         Final Clinical Impression(s) / ED Diagnoses Final diagnoses:  Acute respiratory failure with hypoxia ([name])  Atrial fibrillation with RVR ([place])  Community acquired pneumonia of left upper lobe of lung  Altered mental status, unspecified altered mental status type  AKI (acute kidney injury) ([place])    Rx / [place] Orders ED Discharge Orders     None         Hayden Rasmussen, MD 03/10/22 1821

## 2022-03-10 NOTE — Telephone Encounter (Signed)
Requested medication (s) are due for refill today - yes  Requested medication (s) are on the active medication list -yes  Future visit scheduled -no  Last refill: 12/09/21 #30 3RF  Notes to clinic: Patient fails lab protocol- hospital labs today  Requested Prescriptions  Pending Prescriptions Disp Refills   furosemide (LASIX) 40 MG tablet 30 tablet 3    Sig: Take 1 tablet (40 mg total) by mouth daily.     Cardiovascular:  Diuretics - Loop Failed - 03/10/2022 12:07 PM      Failed - K in normal range and within 180 days    Potassium  Date Value Ref Range Status  03/10/2022 4.3 3.5 - 5.1 mmol/L Final         Failed - Ca in normal range and within 180 days    Calcium  Date Value Ref Range Status  03/10/2022 8.8 (L) 8.9 - 10.3 mg/dL Final   Calcium, Ion  Date Value Ref Range Status  03/10/2022 1.18 1.15 - 1.40 mmol/L Final         Failed - Na in normal range and within 180 days    Sodium  Date Value Ref Range Status  03/10/2022 133 (L) 135 - 145 mmol/L Final         Failed - Cr in normal range and within 180 days    Creat  Date Value Ref Range Status  09/05/2021 1.20 0.70 - 1.22 mg/dL Final   Creatinine, Ser  Date Value Ref Range Status  03/10/2022 2.61 (H) 0.61 - 1.24 mg/dL Final   Creatinine, Urine  Date Value Ref Range Status  05/12/2014 68.0 mg/dL Final         Failed - Cl in normal range and within 180 days    Chloride  Date Value Ref Range Status  03/10/2022 95 (L) 98 - 111 mmol/L Final         Failed - Last BP in normal range    BP Readings from Last 1 Encounters:  03/10/22 (!) 81/58         Passed - Mg Level in normal range and within 180 days    Magnesium  Date Value Ref Range Status  03/10/2022 2.4 1.7 - 2.4 mg/dL Final    Comment:    Performed at [facility], [place] [address] [place], [place] [address]         Passed - Valid encounter within last 6 months    Recent Outpatient Visits           4 weeks ago COPD with acute exacerbation  ([place])   [place] Susy Frizzle, MD   6 months ago Uncontrolled type 2 diabetes mellitus with hyperglycemia ([place])   [place] Susy Frizzle, MD   7 months ago COPD with acute exacerbation ([place])   [place] [facility] Susy Frizzle, MD   8 months ago Uncontrolled type 2 diabetes mellitus with hyperglycemia ([place])   [place] [name], [name], MD   8 months ago Confusion   [place] [name], [name], MD              Signed Prescriptions Disp Refills   glucose blood (ONETOUCH ULTRA) test strip 50 strip 3    Sig: CHECK FASTING BLOOD SUGAR TWICE DAILY     Endocrinology: Diabetes - Testing Supplies Passed - 03/10/2022 12:07 PM      Passed - Valid encounter within  last 12 months    Recent Outpatient Visits           4 weeks ago COPD with acute exacerbation ([place])   [place] Susy Frizzle, MD   6 months ago Uncontrolled type 2 diabetes mellitus with hyperglycemia ([place])   [place] Susy Frizzle, MD   7 months ago COPD with acute exacerbation [facility])   [place] Susy Frizzle, MD   8 months ago Uncontrolled type 2 diabetes mellitus with hyperglycemia [facility])   [place] [name], [name], MD   8 months ago Confusion   [place] [name], [name], MD                 Requested Prescriptions  Pending Prescriptions Disp Refills   furosemide (LASIX) 40 MG tablet 30 tablet 3    Sig: Take 1 tablet (40 mg total) by mouth daily.     Cardiovascular:  Diuretics - Loop Failed - 03/10/2022 12:07 PM      Failed - K in normal range and within 180 days    Potassium  Date Value Ref Range Status  03/10/2022 4.3 3.5 - 5.1 mmol/L Final         Failed - Ca in normal range and within 180 days    Calcium  Date Value Ref Range Status  03/10/2022 8.8 (L) 8.9 - 10.3 mg/dL Final   Calcium, Ion   Date Value Ref Range Status  03/10/2022 1.18 1.15 - 1.40 mmol/L Final         Failed - Na in normal range and within 180 days    Sodium  Date Value Ref Range Status  03/10/2022 133 (L) 135 - 145 mmol/L Final         Failed - Cr in normal range and within 180 days    Creat  Date Value Ref Range Status  09/05/2021 1.20 0.70 - 1.22 mg/dL Final   Creatinine, Ser  Date Value Ref Range Status  03/10/2022 2.61 (H) 0.61 - 1.24 mg/dL Final   Creatinine, Urine  Date Value Ref Range Status  05/12/2014 68.0 mg/dL Final         Failed - Cl in normal range and within 180 days    Chloride  Date Value Ref Range Status  03/10/2022 95 (L) 98 - 111 mmol/L Final         Failed - Last BP in normal range    BP Readings from Last 1 Encounters:  03/10/22 (!) 81/58         Passed - Mg Level in normal range and within 180 days    Magnesium  Date Value Ref Range Status  03/10/2022 2.4 1.7 - 2.4 mg/dL Final    Comment:    Performed at [facility], [place] [address], [place], [place] [address]         Passed - Valid encounter within last 6 months    Recent Outpatient Visits           4 weeks ago COPD with acute exacerbation ([place])   [place] Susy Frizzle, MD   6 months ago Uncontrolled type 2 diabetes mellitus with hyperglycemia ([place])   [facility] Susy Frizzle, MD   7 months ago COPD with acute exacerbation [facility])   [facility] Susy Frizzle, MD   8 months ago Uncontrolled type 2 diabetes mellitus  with hyperglycemia ([place])   [place] [name], [name], MD   8 months ago Confusion   [place] Schaumann, [name], MD              Signed Prescriptions Disp Refills   glucose blood (ONETOUCH ULTRA) test strip 50 strip 3    Sig: CHECK FASTING BLOOD SUGAR TWICE DAILY     Endocrinology: Diabetes - Testing Supplies Passed - 03/10/2022 12:07 PM      Passed - Valid encounter within  last 12 months    Recent Outpatient Visits           4 weeks ago COPD with acute exacerbation ([place])   [place] Susy Frizzle, MD   6 months ago Uncontrolled type 2 diabetes mellitus with hyperglycemia ([place])   [place] Susy Frizzle, MD   7 months ago COPD with acute exacerbation [facility])   [facility] Susy Frizzle, MD   8 months ago Uncontrolled type 2 diabetes mellitus with hyperglycemia ([place])   [place] [name], [name], MD   8 months ago Confusion   [place] [name], [name], MD

## 2022-03-10 NOTE — Assessment & Plan Note (Signed)
Due to community acquired pneumonia  Lactate 3.0 Received 2 L N/S so far, BP remains marginal, lactate cleared  -Continue fluid resuscitation.

## 2022-03-10 NOTE — Progress Notes (Signed)
[facility] Progress Note Patient Name: MATTEO BANKE DOB: 25-Mar-1935 MRN: [id number]   Date of Service  03/10/2022  HPI/Events of Note  Patient is attempting to climb out of bed, risking falling.  eICU Interventions  Waist belt restraint ordered for patient safety.        Kerry Kass Ainsley Sanguinetti 03/10/2022, 10:47 PM

## 2022-03-10 NOTE — Assessment & Plan Note (Addendum)
Likely due to stress from possible CAP  Prior echo normal  HS are unremarkable.  -heparin infusion for now. Keep HR 80-110 - Load with digoxin for rate control.  - Resume home apixaban and metoprolol once stable

## 2022-03-11 DIAGNOSIS — J9621 Acute and chronic respiratory failure with hypoxia: Secondary | ICD-10-CM | POA: Diagnosis not present

## 2022-03-11 LAB — GLUCOSE, CAPILLARY
Glucose-Capillary: 93 mg/dL (ref 70–99)
Glucose-Capillary: 88 mg/dL (ref 70–99)
Glucose-Capillary: 101 mg/dL — ABNORMAL HIGH (ref 70–99)
Glucose-Capillary: 88 mg/dL (ref 70–99)
Glucose-Capillary: 92 mg/dL (ref 70–99)
Glucose-Capillary: 273 mg/dL — ABNORMAL HIGH (ref 70–99)

## 2022-03-11 LAB — CBC WITH DIFFERENTIAL/PLATELET
Abs Immature Granulocytes: 0.27 10*3/uL — ABNORMAL HIGH (ref 0.00–0.07)
Basophils Absolute: 0 10*3/uL (ref 0.0–0.1)
Basophils Relative: 0 %
Eosinophils Absolute: 0 10*3/uL (ref 0.0–0.5)
Eosinophils Relative: 0 %
HCT: 29.9 % — ABNORMAL LOW (ref 39.0–52.0)
Hemoglobin: 9.5 g/dL — ABNORMAL LOW (ref 13.0–17.0)
Immature Granulocytes: 2 %
Lymphocytes Relative: 6 %
Lymphs Abs: 0.8 10*3/uL (ref 0.7–4.0)
MCH: 27.4 pg (ref 26.0–34.0)
MCHC: 31.8 g/dL (ref 30.0–36.0)
MCV: 86.2 fL (ref 80.0–100.0)
Monocytes Absolute: 0.6 10*3/uL (ref 0.1–1.0)
Monocytes Relative: 4 %
Neutro Abs: 12.2 10*3/uL — ABNORMAL HIGH (ref 1.7–7.7)
Neutrophils Relative %: 88 %
Platelets: 253 10*3/uL (ref 150–400)
RBC: 3.47 MIL/uL — ABNORMAL LOW (ref 4.22–5.81)
RDW: 14.5 % (ref 11.5–15.5)
WBC: 13.9 10*3/uL — ABNORMAL HIGH (ref 4.0–10.5)
nRBC: 0 % (ref 0.0–0.2)

## 2022-03-11 LAB — COMPREHENSIVE METABOLIC PANEL
ALT: 22 U/L (ref 0–44)
AST: 42 U/L — ABNORMAL HIGH (ref 15–41)
Albumin: 2.1 g/dL — ABNORMAL LOW (ref 3.5–5.0)
Alkaline Phosphatase: 67 U/L (ref 38–126)
Anion gap: 12 (ref 5–15)
BUN: 50 mg/dL — ABNORMAL HIGH (ref 8–23)
CO2: 25 mmol/L (ref 22–32)
Calcium: 8 mg/dL — ABNORMAL LOW (ref 8.9–10.3)
Chloride: 99 mmol/L (ref 98–111)
Creatinine, Ser: 1.86 mg/dL — ABNORMAL HIGH (ref 0.61–1.24)
GFR, Estimated: 35 mL/min — ABNORMAL LOW (ref >60)
Glucose, Bld: 87 mg/dL (ref 70–99)
Potassium: 4 mmol/L (ref 3.5–5.1)
Sodium: 136 mmol/L (ref 135–145)
Total Bilirubin: 0.4 mg/dL (ref 0.3–1.2)
Total Protein: 5.2 g/dL — ABNORMAL LOW (ref 6.5–8.1)

## 2022-03-11 LAB — URINE CULTURE: Culture: NO GROWTH

## 2022-03-11 LAB — PROCALCITONIN: Procalcitonin: 3.44 ng/mL

## 2022-03-11 LAB — MRSA NEXT GEN BY PCR, NASAL: MRSA by PCR Next Gen: DETECTED — AB

## 2022-03-11 MED ORDER — METOPROLOL TARTRATE 25 MG PO TABS
25.0000 mg | ORAL_TABLET | Freq: Two times a day (BID) | ORAL | Status: DC
  Administered 2022-03-11 – 2022-03-14 (×7): 25 mg via ORAL
  Filled 2022-03-11 (×7): qty 1

## 2022-03-11 MED ORDER — ONDANSETRON HCL 4 MG/2ML IJ SOLN
INTRAMUSCULAR | Status: AC
  Filled 2022-03-11: qty 2

## 2022-03-11 MED ORDER — MOMETASONE FURO-FORMOTEROL FUM 200-5 MCG/ACT IN AERO
2.0000 | INHALATION_SPRAY | Freq: Two times a day (BID) | RESPIRATORY_TRACT | Status: DC
  Administered 2022-03-11 – 2022-03-14 (×6): 2 via RESPIRATORY_TRACT
  Filled 2022-03-11: qty 8.8

## 2022-03-11 MED ORDER — APIXABAN 2.5 MG PO TABS
2.5000 mg | ORAL_TABLET | Freq: Two times a day (BID) | ORAL | Status: DC
  Administered 2022-03-11 – 2022-03-14 (×7): 2.5 mg via ORAL
  Filled 2022-03-11 (×7): qty 1

## 2022-03-11 MED ORDER — ONDANSETRON HCL 4 MG/2ML IJ SOLN
4.0000 mg | Freq: Once | INTRAMUSCULAR | Status: AC
Start: 2022-03-11 — End: 2022-03-11
  Administered 2022-03-11: 4 mg via INTRAVENOUS

## 2022-03-11 MED ORDER — CHLORHEXIDINE GLUCONATE CLOTH 2 % EX PADS
6.0000 | MEDICATED_PAD | Freq: Every day | CUTANEOUS | Status: DC
  Administered 2022-03-12 – 2022-03-14 (×3): 6 via TOPICAL

## 2022-03-11 MED ORDER — IPRATROPIUM-ALBUTEROL 0.5-2.5 (3) MG/3ML IN SOLN
3.0000 mL | Freq: Three times a day (TID) | RESPIRATORY_TRACT | Status: DC
  Administered 2022-03-11 – 2022-03-14 (×8): 3 mL via RESPIRATORY_TRACT
  Filled 2022-03-11 (×8): qty 3

## 2022-03-11 MED ORDER — IPRATROPIUM-ALBUTEROL 0.5-2.5 (3) MG/3ML IN SOLN
3.0000 mL | RESPIRATORY_TRACT | Status: DC
  Administered 2022-03-11: 3 mL via RESPIRATORY_TRACT
  Filled 2022-03-11: qty 3

## 2022-03-11 MED ORDER — MUPIROCIN 2 % EX OINT
1.0000 "application " | TOPICAL_OINTMENT | Freq: Two times a day (BID) | CUTANEOUS | Status: DC
  Administered 2022-03-11 – 2022-03-14 (×6): 1 via NASAL
  Filled 2022-03-11 (×2): qty 22

## 2022-03-11 NOTE — Progress Notes (Signed)
eLink Physician-Brief Progress Note Patient Name: Edward [name] DOB: [birth year] MRN: [id number]   Date of Service  03/11/2022  HPI/Events of Note  Patient is now in sinus rhythm with a rate of 60, he is due to get a third dose of IV Digoxin now.  eICU Interventions  Third dose of Iv Digoxin held pending daytime PCCM attending physician review, (he may be a candidate for transitioning to PO Digoxin at 0.125 mg daily).        Kerry Kass Bodey Frizell 03/11/2022, 5:41 AM

## 2022-03-11 NOTE — Progress Notes (Signed)
Pt transferred from 35M to 1W43

## 2022-03-11 NOTE — Progress Notes (Signed)
[name], MRN:  [id number], DOB:  June 05, 1935, LOS: 1 ADMISSION DATE:  03/10/2022, CONSULTATION DATE: 03/10/2022 REFERRING MD: Emergency department, CHIEF COMPLAINT: Hypoxia  History of Present Illness:  87 year old COPD with O2 dependency presents 03/10/2022 with altered mental status, fever, hypoxia normally on 3 L nasal cannula for essentially end-stage COPD and reportedly still smokes.  Pulmonary critical care asked to admit for these reasons.  He is barely cooperative obvious confusion sats are adequate on nasal cannula.  Admitted to the ICU for monitoring  Pertinent  Medical History   Past Medical History:  Diagnosis Date   Allergy    Rhinitis   Atrial fibrillation ([name])    Bronchitis    Chronic respiratory failure ([name])    Colon polyps    COPD (chronic obstructive pulmonary disease) ([place])    Diabetes mellitus    Elevated lipids    Hypercholesterolemia    Hypertension    Iron deficiency anemia due to chronic blood loss 04/30/2018   Noncompliance    On home O2    2L N/C    PSA elevation    Pulmonary fibrosis ([name])    Vitamin D deficiency      Significant Hospital Events: Including procedures, antibiotic start and stop dates in addition to other pertinent events   6/9 Admitted with hypoxia failure to thrive  Interim History / Subjective:   Much more awake today.  Converted to normal sinus rhythm overnight.  Objective   Blood pressure 125/75, pulse 82, temperature 98.5 F (36.9 C), temperature source Oral, resp. rate (!) 22, height '5\' 5"'$  (1.651 m), weight 73.1 kg, SpO2 98 %.        Intake/Output Summary (Last 24 hours) at 03/11/2022 0959 Last data filed at 03/11/2022 0700 Gross per 24 hour  Intake 7522.85 ml  Output 775 ml  Net 6747.85 ml   Filed Weights   03/10/22 0936 03/11/22 0500  Weight: 72 kg 73.1 kg    Examination: Gen:      No acute distress HEENT:  EOMI, sclera anicteric Neck:     No masses; no thyromegaly Lungs:    Diminished breath  sounds CV:         Regular rate and rhythm; no murmurs Abd:      + bowel sounds; soft, non-tender; no palpable masses, no distension Ext:    No edema; adequate peripheral perfusion Skin:      Warm and dry; no rash Neuro: alert and oriented x 3 Psych: normal mood and affect   Labs/imaging personally reviewed Significant for BUN/creatinine 50/1.86 AST 42, ALT 22, PCT 3.44 WBC 13.9, hemoglobin 9.5, platelets 253  Resolved Hospital Problem list     Assessment & Plan:  Acute on chronic hypoxic respiratory failure with left upper lobe pneumonia and COPD O2 dependent who reportedly is still smoking. Continue treatment for community-acquired pneumonia Bronchodilators, steroids  Atrial fibrillation with rapid ventricular response he becomes hypotensive with diltiazem and metoprolol. History of congestive heart failure EF 65% He is converted back to normal sinus rhythm.  Continue telemetry monitoring. Resume home eliquis and lopressor  Altered mental status may be secondary to metabolic disarray. CT of the head with no acute abnormalities.  DM  SSI coverage  AKI Creatinine improved.  Monitor  Stable for transfer out of ICU and to hospitalist service  Best Practice (right click and "Reselect all SmartList Selections" daily)   Diet/type: Regular consistency (see orders) DVT prophylaxis: systemic heparin GI prophylaxis: PPI Lines: N/A Foley:  N/A Code Status:  full code Last date of multidisciplinary goals of care discussion [tbd]  Critical care time: NA   Marshell Garfinkel MD [facility] See Amion for pager  If no response to pager , please call [phone] until 7pm After 7:00 pm call Elink  [phone] 03/11/2022, 9:59 AM

## 2022-03-12 ENCOUNTER — Inpatient Hospital Stay (HOSPITAL_COMMUNITY): Payer: Medicare Other

## 2022-03-12 DIAGNOSIS — R7989 Other specified abnormal findings of blood chemistry: Secondary | ICD-10-CM

## 2022-03-12 DIAGNOSIS — J9621 Acute and chronic respiratory failure with hypoxia: Secondary | ICD-10-CM | POA: Diagnosis not present

## 2022-03-12 LAB — CBC WITH DIFFERENTIAL/PLATELET
Abs Immature Granulocytes: 0.14 10*3/uL — ABNORMAL HIGH (ref 0.00–0.07)
Basophils Absolute: 0 10*3/uL (ref 0.0–0.1)
Basophils Relative: 0 %
Eosinophils Absolute: 0 10*3/uL (ref 0.0–0.5)
Eosinophils Relative: 0 %
HCT: 32.4 % — ABNORMAL LOW (ref 39.0–52.0)
Hemoglobin: 10.5 g/dL — ABNORMAL LOW (ref 13.0–17.0)
Immature Granulocytes: 1 %
Lymphocytes Relative: 5 %
Lymphs Abs: 0.7 10*3/uL (ref 0.7–4.0)
MCH: 28.3 pg (ref 26.0–34.0)
MCHC: 32.4 g/dL (ref 30.0–36.0)
MCV: 87.3 fL (ref 80.0–100.0)
Monocytes Absolute: 0.7 10*3/uL (ref 0.1–1.0)
Monocytes Relative: 4 %
Neutro Abs: 14.1 10*3/uL — ABNORMAL HIGH (ref 1.7–7.7)
Neutrophils Relative %: 90 %
Platelets: 302 10*3/uL (ref 150–400)
RBC: 3.71 MIL/uL — ABNORMAL LOW (ref 4.22–5.81)
RDW: 14.5 % (ref 11.5–15.5)
WBC: 15.6 10*3/uL — ABNORMAL HIGH (ref 4.0–10.5)
nRBC: 0 % (ref 0.0–0.2)

## 2022-03-12 LAB — COMPREHENSIVE METABOLIC PANEL
ALT: 22 U/L (ref 0–44)
AST: 28 U/L (ref 15–41)
Albumin: 2.2 g/dL — ABNORMAL LOW (ref 3.5–5.0)
Alkaline Phosphatase: 77 U/L (ref 38–126)
Anion gap: 11 (ref 5–15)
BUN: 56 mg/dL — ABNORMAL HIGH (ref 8–23)
CO2: 25 mmol/L (ref 22–32)
Calcium: 7.8 mg/dL — ABNORMAL LOW (ref 8.9–10.3)
Chloride: 99 mmol/L (ref 98–111)
Creatinine, Ser: 1.94 mg/dL — ABNORMAL HIGH (ref 0.61–1.24)
GFR, Estimated: 34 mL/min — ABNORMAL LOW (ref >60)
Glucose, Bld: 259 mg/dL — ABNORMAL HIGH (ref 70–99)
Potassium: 4.3 mmol/L (ref 3.5–5.1)
Sodium: 135 mmol/L (ref 135–145)
Total Bilirubin: 0.4 mg/dL (ref 0.3–1.2)
Total Protein: 5.6 g/dL — ABNORMAL LOW (ref 6.5–8.1)

## 2022-03-12 LAB — ECHOCARDIOGRAM COMPLETE
AR max vel: 1.54 cm2
AV Peak grad: 20.3 mmHg
Ao pk vel: 2.25 m/s
Area-P 1/2: 3.53 cm2
Height: 65 in
S' Lateral: 2.8 cm
Weight: 2578.5 oz

## 2022-03-12 LAB — GLUCOSE, CAPILLARY
Glucose-Capillary: 193 mg/dL — ABNORMAL HIGH (ref 70–99)
Glucose-Capillary: 110 mg/dL — ABNORMAL HIGH (ref 70–99)
Glucose-Capillary: 226 mg/dL — ABNORMAL HIGH (ref 70–99)
Glucose-Capillary: 149 mg/dL — ABNORMAL HIGH (ref 70–99)
Glucose-Capillary: 215 mg/dL — ABNORMAL HIGH (ref 70–99)
Glucose-Capillary: 219 mg/dL — ABNORMAL HIGH (ref 70–99)

## 2022-03-12 LAB — PROCALCITONIN: Procalcitonin: 2.63 ng/mL

## 2022-03-12 MED ORDER — AZITHROMYCIN 500 MG PO TABS
500.0000 mg | ORAL_TABLET | Freq: Every day | ORAL | Status: DC
  Administered 2022-03-12 – 2022-03-13 (×2): 500 mg via ORAL
  Filled 2022-03-12 (×3): qty 1

## 2022-03-12 MED ORDER — BUDESONIDE 0.25 MG/2ML IN SUSP
0.2500 mg | Freq: Two times a day (BID) | RESPIRATORY_TRACT | Status: DC
  Administered 2022-03-12 – 2022-03-14 (×4): 0.25 mg via RESPIRATORY_TRACT
  Filled 2022-03-12 (×4): qty 2

## 2022-03-12 MED ORDER — PANTOPRAZOLE SODIUM 40 MG PO TBEC
40.0000 mg | DELAYED_RELEASE_TABLET | Freq: Every day | ORAL | Status: DC
  Administered 2022-03-12 – 2022-03-13 (×2): 40 mg via ORAL
  Filled 2022-03-12 (×2): qty 1

## 2022-03-12 NOTE — Progress Notes (Signed)
PROGRESS NOTE    Edward [name]  OHY:[id number] DOB: 12/20/1934 DOA: 03/10/2022 PCP: Susy Frizzle, MD   Brief Narrative:    87 year old COPD with O2 dependency presents 03/10/2022 with altered mental status, fever, hypoxia normally on 3 L nasal cannula for essentially end-stage COPD and reportedly still smokes.  PCCM initially admitted patient for treatment of acute on chronic hypoxemic respiratory failure with left upper lobe pneumonia with associated metabolic encephalopathy as well as some initial atrial fibrillation with RVR which has now converted to sinus rhythm.  He was also noted to have some AKI which has improved.  He currently remains on IV antibiotics as well as steroids and breathing treatments with significant improvement noted, however he still not back to baseline quite yet and struggles with chest tightness along with difficulty in talking.  Assessment & Plan:   Active Problems:   Acute and chronic respiratory failure with hypoxia ([name])   Atrial fibrillation with rapid ventricular response ([name])   Community acquired pneumonia of left upper lobe of lung   AKI (acute kidney injury) ([place])   Septic shock ([name])  Assessment and Plan:   Acute on chronic hypoxemic respiratory failure -Wears 3 L nasal cannula at home and is currently on 3 L, but has ongoing symptoms of COPD exacerbation with chest tightness and wheezing -Secondary to COPD exacerbation with associated left upper lobe pneumonia -Continue treatment with IV antibiotics for CAP -Continue bronchodilators and steroids as ordered -Added Pulmicort twice daily to further assist on 9/48  Acute metabolic encephalopathy secondary to above -Currently resolved -CT head with no acute findings  Troponin elevation -Likely some demand ischemia and also in setting of AKI -Noted on admission -Denies any chest pain -Order 2D echocardiogram 6/11 to update from previous on 7/21; review and consider cardiology consultation as  needed   Atrial fibrillation -Initially with RVR which has converted to sinus rhythm -LVEF 65-70 % noted on 7/21 -Continue home Eliquis and Lopressor  Type 2 diabetes -With some noted hyperglycemia that is steroid-induced -Continue close monitoring and SSI  AKI on CKD stage IIIb -Creatinine baseline 1.2-1.3 -Avoid nephrotoxic agents -Appears to be resolving without the use of IV fluid and patient appears euvolemic and is tolerating diet -Avoid IV fluid for now and monitor a.m. labs   DVT prophylaxis:Eliquis Code Status: Full Family Communication: Tried calling son on phone 6/11 with no response Disposition Plan:  Status is: Inpatient Remains inpatient appropriate because: Need for IV medications  Consultants:  PCCM  Procedures:  None  Antimicrobials:  Anti-infectives (From admission, onward)    Start     Dose/Rate Route Frequency Ordered Stop   03/10/22 1545  cefTRIAXone (ROCEPHIN) 1 g in sodium chloride 0.9 % 100 mL IVPB        1 g 200 mL/hr over 30 Minutes Intravenous Every 24 hours 03/10/22 1455 03/17/22 1544   03/10/22 1545  azithromycin (ZITHROMAX) 500 mg in sodium chloride 0.9 % 250 mL IVPB        500 mg 250 mL/hr over 60 Minutes Intravenous Every 24 hours 03/10/22 1455 03/15/22 1544   03/10/22 0945  cefTRIAXone (ROCEPHIN) 1 g in sodium chloride 0.9 % 100 mL IVPB        1 g 200 mL/hr over 30 Minutes Intravenous  Once 03/10/22 0931 03/10/22 1024   03/10/22 0945  azithromycin (ZITHROMAX) 500 mg in sodium chloride 0.9 % 250 mL IVPB        500 mg 250 mL/hr over 60 Minutes Intravenous  Once 03/10/22 0931 03/10/22 1206       Subjective: Patient seen and evaluated today and states that he is doing better from a respiratory standpoint.  He does continue to have some chest tightness and wheezing and has difficulty speaking in sentences.  He denies any chest pain.  Objective: Vitals:   03/12/22 0000 03/12/22 0312 03/12/22 0744 03/12/22 0810  BP: 133/68 (!) 151/74  (!) 147/72   Pulse: 74 81 75   Resp: (!) '21 20 17   '$ Temp:  97.8 F (36.6 C) 97.7 F (36.5 C)   TempSrc:  Oral Oral   SpO2: 93% 93% 94% 92%  Weight:      Height:        Intake/Output Summary (Last 24 hours) at 03/12/2022 1125 Last data filed at 03/12/2022 0315 Gross per 24 hour  Intake 350.09 ml  Output 650 ml  Net -299.91 ml   Filed Weights   03/10/22 0936 03/11/22 0500  Weight: 72 kg 73.1 kg    Examination:  General exam: Appears calm and comfortable  Respiratory system: Minimal wheezing bilaterally. Respiratory effort normal.  Currently on 3 L nasal cannula Cardiovascular system: S1 & S2 heard, RRR.  Gastrointestinal system: Abdomen is soft Central nervous system: Alert and awake Extremities: No edema Skin: No significant lesions noted Psychiatry: Flat affect.    Data Reviewed: I have personally reviewed following labs and imaging studies  CBC: Recent Labs  Lab 03/10/22 0930 03/10/22 1019 03/10/22 1315 03/11/22 0609 03/12/22 0103  WBC 18.5*  --   --  13.9* 15.6*  NEUTROABS 13.8*  --   --  12.2* 14.1*  HGB 12.3* 11.9* 10.5* 9.5* 10.5*  HCT 38.6* 35.0* 31.0* 29.9* 32.4*  MCV 87.1  --   --  86.2 87.3  PLT 259  --   --  253 818   Basic Metabolic Panel: Recent Labs  Lab 03/10/22 0930 03/10/22 1019 03/10/22 1315 03/11/22 0609 03/12/22 0103  NA 135 131* 133* 136 135  K 5.1 5.6* 4.3 4.0 4.3  CL 95*  --   --  99 99  CO2 24  --   --  25 25  GLUCOSE 115*  --   --  87 259*  BUN 62*  --   --  50* 56*  CREATININE 2.61*  --   --  1.86* 1.94*  CALCIUM 8.8*  --   --  8.0* 7.8*  MG 2.4  --   --   --   --    GFR: Estimated Creatinine Clearance: 24.7 mL/min (A) (by C-G formula based on SCr of 1.94 mg/dL (H)). Liver Function Tests: Recent Labs  Lab 03/10/22 0930 03/11/22 0609 03/12/22 0103  AST 42* 42* 28  ALT '18 22 22  '$ ALKPHOS 81 67 77  BILITOT 0.6 0.4 0.4  PROT 6.0* 5.2* 5.6*  ALBUMIN 2.6* 2.1* 2.2*   No results for input(s): "LIPASE", "AMYLASE" in  the last 168 hours. No results for input(s): "AMMONIA" in the last 168 hours. Coagulation Profile: Recent Labs  Lab 03/10/22 0930  INR 1.2   Cardiac Enzymes: No results for input(s): "CKTOTAL", "CKMB", "CKMBINDEX", "TROPONINI" in the last 168 hours. BNP (last 3 results) No results for input(s): "PROBNP" in the last 8760 hours. HbA1C: Recent Labs    03/10/22 0930  HGBA1C 13.6*   CBG: Recent Labs  Lab 03/11/22 1520 03/11/22 1950 03/11/22 2316 03/12/22 0317 03/12/22 0747  GLUCAP 88 92 273* 193* 110*   Lipid Profile: No results for  input(s): "CHOL", "HDL", "LDLCALC", "TRIG", "CHOLHDL", "LDLDIRECT" in the last 72 hours. Thyroid Function Tests: No results for input(s): "TSH", "T4TOTAL", "FREET4", "T3FREE", "THYROIDAB" in the last 72 hours. Anemia Panel: No results for input(s): "VITAMINB12", "FOLATE", "FERRITIN", "TIBC", "IRON", "RETICCTPCT" in the last 72 hours. Sepsis Labs: Recent Labs  Lab 03/10/22 0930 03/10/22 1216 03/11/22 0609 03/12/22 0103  PROCALCITON  --  3.11 3.44 2.63  LATICACIDVEN 3.0* 1.8  --   --     Recent Results (from the past 240 hour(s))  Blood Culture (routine x 2)     Status: None (Preliminary result)   Collection Time: 03/10/22  9:24 AM   Specimen: BLOOD  Result Value Ref Range Status   Specimen Description BLOOD BLOOD LEFT FOREARM  Final   Special Requests   Final    BOTTLES DRAWN AEROBIC AND ANAEROBIC Blood Culture results may not be optimal due to an inadequate volume of blood received in culture bottles   Culture   Final    NO GROWTH < 24 HOURS Performed at [facility], [place] [address], [place], [place] [address]    Report Status PENDING  Incomplete  SARS Coronavirus 2 by RT PCR (hospital order, performed in [facility] hospital lab) *cepheid single result test* Anterior Nasal Swab     Status: None   Collection Time: 03/10/22  9:29 AM   Specimen: Anterior Nasal Swab  Result Value Ref Range Status   SARS Coronavirus 2 by RT PCR  NEGATIVE NEGATIVE Final    Comment: (NOTE) SARS-CoV-2 target nucleic acids are NOT DETECTED.  The SARS-CoV-2 RNA is generally detectable in upper and lower respiratory specimens during the acute phase of infection. The lowest concentration of SARS-CoV-2 viral copies this assay can detect is 250 copies / mL. A negative result does not preclude SARS-CoV-2 infection and should not be used as the sole basis for treatment or other patient management decisions.  A negative result may occur with improper specimen collection / handling, submission of specimen other than nasopharyngeal swab, presence of viral mutation(s) within the areas targeted by this assay, and inadequate number of viral copies (<250 copies / mL). A negative result must be combined with clinical observations, patient history, and epidemiological information.  Fact Sheet for Patients:   https://www.patel.info/  Fact Sheet for Healthcare Providers: https://hall.com/  This test is not yet approved or  cleared by the [place] FDA and has been authorized for detection and/or diagnosis of SARS-CoV-2 by FDA under an Emergency Use Authorization (EUA).  This EUA will remain in effect (meaning this test can be used) for the duration of the COVID-19 declaration under Section 564(b)(1) of the Act, 21 U.S.C. section 360bbb-3(b)(1), unless the authorization is terminated or revoked sooner.  Performed at [facility], [place] [address], [place], [place] [address]   Blood Culture (routine x 2)     Status: None (Preliminary result)   Collection Time: 03/10/22  9:30 AM   Specimen: BLOOD  Result Value Ref Range Status   Specimen Description BLOOD BLOOD LEFT HAND  Final   Special Requests   Final    BOTTLES DRAWN AEROBIC AND ANAEROBIC Blood Culture results may not be optimal due to an inadequate volume of blood received in culture bottles   Culture   Final    NO GROWTH < 24  HOURS Performed at [facility], [place] [address] [place],  [address]    Report Status PENDING  Incomplete  Urine Culture  Status: None   Collection Time: 03/10/22 10:00 AM   Specimen: In/Out Cath Urine  Result Value Ref Range Status   Specimen Description IN/OUT CATH URINE  Final   Special Requests NONE  Final   Culture   Final    NO GROWTH Performed at [facility], [place] [address] [place], [place] [address]    Report Status 03/11/2022 FINAL  Final  MRSA Next Gen by PCR, Nasal     Status: Abnormal   Collection Time: 03/11/22  8:23 AM   Specimen: Nasal Mucosa; Nasal Swab  Result Value Ref Range Status   MRSA by PCR Next Gen DETECTED (A) NOT DETECTED Final    Comment: RESULT CALLED TO, READ BACK BY AND VERIFIED WITH: Theodosia Quay RN, AT 1352 03/11/22 D. VANHOOK (NOTE) The GeneXpert MRSA Assay (FDA approved for NASAL specimens only), is one component of a comprehensive MRSA colonization surveillance program. It is not intended to diagnose MRSA infection nor to guide or monitor treatment for MRSA infections. Test performance is not FDA approved in patients less than 44 years old. Performed at [facility], [place] [address] [place], [place] [address]          Radiology Studies: No results found.      Scheduled Meds:  apixaban  2.5 mg Oral BID   budesonide (PULMICORT) nebulizer solution  0.25 mg Nebulization BID   Chlorhexidine Gluconate Cloth  6 each Topical Daily   Chlorhexidine Gluconate Cloth  6 each Topical Q0600   insulin aspart  0-15 Units Subcutaneous Q4H   ipratropium-albuterol  3 mL Nebulization TID   methylPREDNISolone (SOLU-MEDROL) injection  40 mg Intravenous Q24H   metoprolol tartrate  25 mg Oral BID   mometasone-formoterol  2 puff Inhalation BID   mupirocin ointment  1 application  Nasal BID   pantoprazole (PROTONIX) IV  40 mg Intravenous QHS   Continuous Infusions:  sodium chloride Stopped (03/10/22 1721)   azithromycin  Stopped (03/11/22 1721)   cefTRIAXone (ROCEPHIN)  IV Stopped (03/11/22 1600)     LOS: 2 days    Time spent: 35 minutes    Itzia Cunliffe Darleen Crocker, DO [facility]  If 7PM-7AM, please contact night-coverage [email or web address] 03/12/2022, 11:25 AM

## 2022-03-13 ENCOUNTER — Encounter (HOSPITAL_COMMUNITY): Payer: Self-pay | Admitting: Pulmonary Disease

## 2022-03-13 DIAGNOSIS — R778 Other specified abnormalities of plasma proteins: Secondary | ICD-10-CM

## 2022-03-13 DIAGNOSIS — J9621 Acute and chronic respiratory failure with hypoxia: Secondary | ICD-10-CM | POA: Diagnosis not present

## 2022-03-13 DIAGNOSIS — I4891 Unspecified atrial fibrillation: Secondary | ICD-10-CM

## 2022-03-13 DIAGNOSIS — R4182 Altered mental status, unspecified: Secondary | ICD-10-CM | POA: Diagnosis not present

## 2022-03-13 DIAGNOSIS — N179 Acute kidney failure, unspecified: Secondary | ICD-10-CM

## 2022-03-13 LAB — CBC WITH DIFFERENTIAL/PLATELET
Abs Immature Granulocytes: 0.14 10*3/uL — ABNORMAL HIGH (ref 0.00–0.07)
Basophils Absolute: 0 10*3/uL (ref 0.0–0.1)
Basophils Relative: 0 %
Eosinophils Absolute: 0 10*3/uL (ref 0.0–0.5)
Eosinophils Relative: 0 %
HCT: 34.3 % — ABNORMAL LOW (ref 39.0–52.0)
Hemoglobin: 10.9 g/dL — ABNORMAL LOW (ref 13.0–17.0)
Immature Granulocytes: 1 %
Lymphocytes Relative: 4 %
Lymphs Abs: 0.7 10*3/uL (ref 0.7–4.0)
MCH: 27.5 pg (ref 26.0–34.0)
MCHC: 31.8 g/dL (ref 30.0–36.0)
MCV: 86.6 fL (ref 80.0–100.0)
Monocytes Absolute: 0.7 10*3/uL (ref 0.1–1.0)
Monocytes Relative: 5 %
Neutro Abs: 13.3 10*3/uL — ABNORMAL HIGH (ref 1.7–7.7)
Neutrophils Relative %: 90 %
Platelets: 324 10*3/uL (ref 150–400)
RBC: 3.96 MIL/uL — ABNORMAL LOW (ref 4.22–5.81)
RDW: 14.5 % (ref 11.5–15.5)
WBC: 14.8 10*3/uL — ABNORMAL HIGH (ref 4.0–10.5)
nRBC: 0.1 % (ref 0.0–0.2)

## 2022-03-13 LAB — COMPREHENSIVE METABOLIC PANEL
ALT: 23 U/L (ref 0–44)
AST: 17 U/L (ref 15–41)
Albumin: 2.3 g/dL — ABNORMAL LOW (ref 3.5–5.0)
Alkaline Phosphatase: 81 U/L (ref 38–126)
Anion gap: 6 (ref 5–15)
BUN: 53 mg/dL — ABNORMAL HIGH (ref 8–23)
CO2: 25 mmol/L (ref 22–32)
Calcium: 7.7 mg/dL — ABNORMAL LOW (ref 8.9–10.3)
Chloride: 104 mmol/L (ref 98–111)
Creatinine, Ser: 1.67 mg/dL — ABNORMAL HIGH (ref 0.61–1.24)
GFR, Estimated: 40 mL/min — ABNORMAL LOW (ref >60)
Glucose, Bld: 183 mg/dL — ABNORMAL HIGH (ref 70–99)
Potassium: 3.8 mmol/L (ref 3.5–5.1)
Sodium: 135 mmol/L (ref 135–145)
Total Bilirubin: 0.6 mg/dL (ref 0.3–1.2)
Total Protein: 5.9 g/dL — ABNORMAL LOW (ref 6.5–8.1)

## 2022-03-13 LAB — GLUCOSE, CAPILLARY
Glucose-Capillary: 167 mg/dL — ABNORMAL HIGH (ref 70–99)
Glucose-Capillary: 203 mg/dL — ABNORMAL HIGH (ref 70–99)
Glucose-Capillary: 346 mg/dL — ABNORMAL HIGH (ref 70–99)
Glucose-Capillary: 240 mg/dL — ABNORMAL HIGH (ref 70–99)
Glucose-Capillary: 237 mg/dL — ABNORMAL HIGH (ref 70–99)

## 2022-03-13 LAB — MAGNESIUM: Magnesium: 2.7 mg/dL — ABNORMAL HIGH (ref 1.7–2.4)

## 2022-03-13 MED ORDER — INSULIN GLARGINE-YFGN 100 UNIT/ML ~~LOC~~ SOLN
12.0000 [IU] | Freq: Every day | SUBCUTANEOUS | Status: DC
Start: 2022-03-13 — End: 2022-03-14
  Administered 2022-03-13 – 2022-03-14 (×2): 12 [IU] via SUBCUTANEOUS
  Filled 2022-03-13 (×2): qty 0.12

## 2022-03-13 MED ORDER — FUROSEMIDE 40 MG PO TABS
40.0000 mg | ORAL_TABLET | Freq: Every day | ORAL | Status: DC
  Administered 2022-03-13 – 2022-03-14 (×2): 40 mg via ORAL
  Filled 2022-03-13 (×2): qty 1

## 2022-03-13 MED ORDER — AMLODIPINE BESYLATE 5 MG PO TABS
5.0000 mg | ORAL_TABLET | Freq: Every day | ORAL | Status: DC
  Administered 2022-03-13 – 2022-03-14 (×2): 5 mg via ORAL
  Filled 2022-03-13 (×2): qty 1

## 2022-03-13 MED ORDER — PREDNISONE 20 MG PO TABS
40.0000 mg | ORAL_TABLET | Freq: Every day | ORAL | Status: DC
  Administered 2022-03-13 – 2022-03-14 (×2): 40 mg via ORAL
  Filled 2022-03-13 (×2): qty 2

## 2022-03-13 NOTE — Consult Note (Signed)
Cardiology Consultation:   Patient ID: Edward [name] MRN: [id number]; DOB: July 29, 1935  Admit date: 03/10/2022 Date of Consult: 03/13/2022  PCP:  Susy Frizzle, MD   [facility] Providers Cardiologist:  New   Patient Profile:   Edward [name] is a 87 y.o. male with a hx of hypertension hyperlipidemia, pulmonary fibrosis on home oxygen, diabetes mellitus, COPD, paroxysmal atrial fibrillation who is being seen 03/13/2022 for the evaluation of elevated troponin at the request of Oren Binet MD.  History of Present Illness:   Nuclear study April 2015 showed ejection fraction 69% with prior inferior infarct and very mild peri-infarct ischemia.  Echocardiogram this admission shows normal LV function, grade 2 diastolic dysfunction, mild left atrial enlargement, trace aortic insufficiency.  Patient was admitted June 9 with acute on chronic respiratory failure, pneumonia, sepsis, acute renal insufficiency and atrial fibrillation with rapid ventricular response.  Also noted general failure to thrive.  Patient treated with antibiotics/steroids/bronchodilators with improvement.  He converted to sinus rhythm and renal function improved with hydration.  Noted to have elevated troponin and cardiology asked to evaluate.  Patient has chronic dyspnea on exertion.  No orthopnea, PND, pedal edema or syncope.  He has occasional pain in his right breast area with cough but otherwise no chest pain.  He does not recall the details of when he was admitted.  He has had no chest pain since admission.    Past Medical History:  Diagnosis Date   Allergy    Rhinitis   Atrial fibrillation ([name])    Bronchitis    Chronic respiratory failure ([name])    Colon polyps    COPD (chronic obstructive pulmonary disease) ([place])    Diabetes mellitus    Elevated lipids    Hypercholesterolemia    Hypertension    Iron deficiency anemia due to chronic blood loss 04/30/2018   Noncompliance    On home O2    2L N/C    PSA  elevation    Pulmonary fibrosis ([name])    Vitamin D deficiency     Past Surgical History:  Procedure Laterality Date   BIOPSY  04/23/2018   Procedure: BIOPSY;  Surgeon: Danie Binder, MD;  Location: [facility];  Service: Endoscopy;;  duodenum gastric   CATARACT EXTRACTION W/PHACO  06/25/2012   Procedure: CATARACT EXTRACTION PHACO AND INTRAOCULAR LENS PLACEMENT ([place]);  Surgeon: Elta Guadeloupe T. Gershon Crane, MD;  Location: [facility];  Service: Ophthalmology;  Laterality: Left;  CDE=19.01   CATARACT EXTRACTION W/PHACO  07/09/2012   Procedure: CATARACT EXTRACTION PHACO AND INTRAOCULAR LENS PLACEMENT ([facility]);  Surgeon: Elta Guadeloupe T. Gershon Crane, MD;  Location: [facility];  Service: Ophthalmology;  Laterality: Right;  CDE: 20.09   COLONOSCOPY WITH PROPOFOL N/A 04/23/2018   Procedure: COLONOSCOPY WITH PROPOFOL;  Surgeon: Danie Binder, MD;  Location: [facility];  Service: Endoscopy;  Laterality: N/A;   ESOPHAGOGASTRODUODENOSCOPY (EGD) WITH PROPOFOL N/A 04/23/2018   Procedure: ESOPHAGOGASTRODUODENOSCOPY (EGD) WITH PROPOFOL;  Surgeon: Danie Binder, MD;  Location: [facility];  Service: Endoscopy;  Laterality: N/A;    Inpatient Medications: Scheduled Meds:  apixaban  2.5 mg Oral BID   azithromycin  500 mg Oral Daily   budesonide (PULMICORT) nebulizer solution  0.25 mg Nebulization BID   Chlorhexidine Gluconate Cloth  6 each Topical Daily   Chlorhexidine Gluconate Cloth  6 each Topical Q0600   furosemide  40 mg Oral Daily   insulin aspart  0-15 Units Subcutaneous Q4H   insulin glargine-yfgn  12 Units Subcutaneous  Daily   ipratropium-albuterol  3 mL Nebulization TID   metoprolol tartrate  25 mg Oral BID   mometasone-formoterol  2 puff Inhalation BID   mupirocin ointment  1 application  Nasal BID   pantoprazole  40 mg Oral Daily   predniSONE  40 mg Oral Q breakfast   Continuous Infusions:  sodium chloride Stopped (03/10/22 1721)   cefTRIAXone (ROCEPHIN)  IV 1 g (03/12/22 1509)   PRN Meds: docusate sodium,  ipratropium-albuterol, polyethylene glycol  Allergies:    Allergies  Allergen Reactions   Ace Inhibitors Other (See Comments)    Hyperkalemia--07/23/2013:patient states not familiar with the following allergy    Social History:   Social History   Socioeconomic History   Marital status: Married    Spouse name: [name]   Number of children: 2   Years of education: Not on file   Highest education level: Not on file  Occupational History   Occupation: [employer]   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 Married 50+ years.   Grown twin sons.    Social Determinants of Health   Financial Resource Strain: Low Risk  (12/16/2021)   Overall Financial Resource Strain (CARDIA)    Difficulty of Paying Living Expenses: Not hard at all  Food Insecurity: No Food Insecurity (12/16/2021)   Hunger Vital Sign    Worried About Running Out of Food in the Last Year: Never true    Ran Out of Food in the Last Year: Never true  Transportation Needs: No Transportation Needs (12/16/2021)   PRAPARE - Hydrologist (Medical): No    Lack of Transportation (Non-Medical): No  Physical Activity: Inactive (12/16/2021)   Exercise Vital Sign    Days of Exercise per Week: 0 days    Minutes of Exercise per Session: 0 min  Stress: No Stress Concern Present (12/16/2021)   [place]    Feeling of Stress : Not at all  Social Connections: [place] (12/16/2021)   Social Connection and  Isolation Panel [NHANES]    Frequency of Communication with Friends and Family: More than three times a week    Frequency of Social Gatherings with Friends and Family: More than three times a week    Attends Religious Services: More than 4 times per year    Active Member of [employer] or Organizations: Yes    Attends Music therapist: More than 4 times per year    Marital Status: Married  Human resources officer Violence: Not At Risk (12/16/2021)   Humiliation, Afraid, Rape, and Kick questionnaire    Fear of Current or Ex-Partner: No    Emotionally Abused: No    Physically Abused: No    Sexually Abused: No    Family History:    Family History  Problem Relation Age of Onset   Heart disease Mother    CAD Other  Diabetes Other      ROS:  Please see the history of present illness.  Patient has a cough but denies hemoptysis. All other ROS reviewed and negative.     Physical Exam/Data:   Vitals:   03/13/22 0732 03/13/22 0739 03/13/22 1143 03/13/22 1254  BP: (!) 173/97  (!) 163/87   Pulse: 79 96 79 78  Resp: '20 18 20 18  '$ Temp: 98 F (36.7 C)  98 F (36.7 C)   TempSrc: Oral  Oral   SpO2: 96% 96% 98%   Weight:      Height:        Intake/Output Summary (Last 24 hours) at 03/13/2022 1403 Last data filed at 03/13/2022 1000 Gross per 24 hour  Intake 580 ml  Output 1250 ml  Net -670 ml      03/11/2022    5:00 AM 03/10/2022    9:36 AM 02/10/2022   10:41 AM  Last 3 Weights  Weight (lbs) 161 lb 2.5 oz 158 lb 11.7 oz 158 lb  Weight (kg) 73.1 kg 72 kg 71.668 kg     Body mass index is 26.82 kg/m.  General:  Well nourished, chronically ill-appearing, in no acute distress HEENT: normal Neck: no JVD Vascular: No carotid bruits; Distal pulses 2+ bilaterally Cardiac:  normal S1, S2; RRR; no murmur heart sounds are distant. Lungs: Diminished breath sounds throughout. Abd: soft, nontender, no hepatomegaly  Ext: no edema Musculoskeletal:  No deformities, BUE and BLE strength  normal and equal Skin: warm and dry  Neuro:  CNs 2-12 intact, no focal abnormalities noted Psych:  Normal affect   EKG:  The EKG was personally reviewed and demonstrates: Atrial fibrillation with rapid ventricular response, nonspecific ST changes. Telemetry:  Telemetry was personally reviewed and demonstrates: Sinus tachycardia   Laboratory Data:  High Sensitivity Troponin:   Recent Labs  Lab 03/10/22 0930 03/10/22 1216  TROPONINIHS 92* 821*     Chemistry Recent Labs  Lab 03/10/22 0930 03/10/22 1019 03/11/22 0609 03/12/22 0103 03/13/22 0210  NA 135   < > 136 135 135  K 5.1   < > 4.0 4.3 3.8  CL 95*  --  99 99 104  CO2 24  --  '25 25 25  '$ GLUCOSE 115*  --  87 259* 183*  BUN 62*  --  50* 56* 53*  CREATININE 2.61*  --  1.86* 1.94* 1.67*  CALCIUM 8.8*  --  8.0* 7.8* 7.7*  MG 2.4  --   --   --  2.7*  GFRNONAA 23*  --  35* 34* 40*  ANIONGAP 16*  --  '12 11 6   '$ < > = values in this interval not displayed.    Recent Labs  Lab 03/11/22 0609 03/12/22 0103 03/13/22 0210  PROT 5.2* 5.6* 5.9*  ALBUMIN 2.1* 2.2* 2.3*  AST 42* 28 17  ALT '22 22 23  '$ ALKPHOS 67 77 81  BILITOT 0.4 0.4 0.6    Hematology Recent Labs  Lab 03/11/22 0609 03/12/22 0103 03/13/22 0210  WBC 13.9* 15.6* 14.8*  RBC 3.47* 3.71* 3.96*  HGB 9.5* 10.5* 10.9*  HCT 29.9* 32.4* 34.3*  MCV 86.2 87.3 86.6  MCH 27.4 28.3 27.5  MCHC 31.8 32.4 31.8  RDW 14.5 14.5 14.5  PLT 253 302 324   BNP Recent Labs  Lab 03/10/22 1441  BNP 232.7*       Radiology/Studies:  ECHOCARDIOGRAM COMPLETE  Result Date: 03/12/2022    ECHOCARDIOGRAM REPORT   Patient Name:  Morrell Riddle Kell Date of Exam: 03/12/2022 Medical Rec #:  [id number]      Height:       65.0 in Accession #:    [id number]     Weight:       161.2 lb Date of Birth:  1935/05/18      BSA:          1.805 m Patient Age:    81 years       BP:           141/79 mmHg Patient Gender: M              HR:           76 bpm. Exam Location:  Inpatient Procedure: 2D Echo,  Cardiac Doppler and Color Doppler Indications:    Elevated troponin  History:        Patient has prior history of Echocardiogram examinations, most                 recent 04/17/2020. COPD, Arrythmias:Atrial Fibrillation; Risk                 Factors:Hypertension.  Sonographer:    Jefferey Pica Referring Phys: [id number] [place]  1. Left ventricular ejection fraction, by estimation, is 60 to 65%. The left ventricle has normal function. The left ventricle has no regional wall motion abnormalities. Left ventricular diastolic parameters are consistent with Grade II diastolic dysfunction (pseudonormalization).  2. Right ventricular systolic function is normal. The right ventricular size is normal. There is mildly elevated pulmonary artery systolic pressure.  3. Left atrial size was mildly dilated.  4. The mitral valve is grossly normal. Trivial mitral valve regurgitation. No evidence of mitral stenosis.  5. The aortic valve is grossly normal. There is mild calcification of the aortic valve. There is mild thickening of the aortic valve. Aortic valve regurgitation is trivial. Mild aortic valve stenosis. Conclusion(s)/Recommendation(s): Otherwise normal echocardiogram, with minor abnormalities described in the report. FINDINGS  Left Ventricle: Left ventricular ejection fraction, by estimation, is 60 to 65%. The left ventricle has normal function. The left ventricle has no regional wall motion abnormalities. The left ventricular internal cavity size was normal in size. There is  no left ventricular hypertrophy. Left ventricular diastolic parameters are consistent with Grade II diastolic dysfunction (pseudonormalization). Right Ventricle: The right ventricular size is normal. No increase in right ventricular wall thickness. Right ventricular systolic function is normal. There is mildly elevated pulmonary artery systolic pressure. The tricuspid regurgitant velocity is 2.81  m/s, and with an assumed right  atrial pressure of 8 mmHg, the estimated right ventricular systolic pressure is 30.8 mmHg. Left Atrium: Left atrial size was mildly dilated. Right Atrium: Right atrial size was normal in size. Pericardium: There is no evidence of pericardial effusion. Mitral Valve: The mitral valve is grossly normal. Mild mitral annular calcification. Trivial mitral valve regurgitation. No evidence of mitral valve stenosis. Tricuspid Valve: The tricuspid valve is normal in structure. Tricuspid valve regurgitation is trivial. No evidence of tricuspid stenosis. Aortic Valve: The aortic valve is grossly normal. There is mild calcification of the aortic valve. There is mild thickening of the aortic valve. Aortic valve regurgitation is trivial. Mild aortic stenosis is present. Aortic valve peak gradient measures 20.2 mmHg. Pulmonic Valve: The pulmonic valve was not well visualized. Pulmonic valve regurgitation is not visualized. Aorta: The aortic arch was not well visualized and the aortic root and ascending aorta are structurally normal, with no evidence of  dilitation. Venous: The inferior vena cava was not well visualized. IAS/Shunts: The atrial septum is grossly normal.  LEFT VENTRICLE PLAX 2D LVIDd:         5.10 cm   Diastology LVIDs:         2.80 cm   LV e' medial:    6.80 cm/s LV PW:         0.90 cm   LV E/e' medial:  20.7 LV IVS:        1.30 cm   LV e' lateral:   12.30 cm/s LVOT diam:     2.00 cm   LV E/e' lateral: 11.5 LV SV:         76 LV SV Index:   42 LVOT Area:     3.14 cm  RIGHT VENTRICLE RV Basal diam:  3.20 cm RV S prime:     18.10 cm/s TAPSE (M-mode): 2.7 cm LEFT ATRIUM             Index        RIGHT ATRIUM           Index LA diam:        4.10 cm 2.27 cm/m   RA Area:     14.30 cm LA Vol (A2C):   62.7 ml 34.74 ml/m  RA Volume:   33.40 ml  18.51 ml/m LA Vol (A4C):   44.0 ml 24.38 ml/m LA Biplane Vol: 55.0 ml 30.48 ml/m  AORTIC VALVE                 PULMONIC VALVE AV Area (Vmax): 1.54 cm     PV Vmax:       0.87 m/s  AV Vmax:        225.00 cm/s  PV Peak grad:  3.0 mmHg AV Peak Grad:   20.2 mmHg LVOT Vmax:      110.00 cm/s LVOT Vmean:     63.000 cm/s LVOT VTI:       0.241 m  AORTA Ao Root diam: 3.30 cm Ao Asc diam:  3.40 cm MITRAL VALVE                TRICUSPID VALVE MV Area (PHT): 3.53 cm     TR Peak grad:   31.6 mmHg MV Decel Time: 215 msec     TR Vmax:        281.00 cm/s MV E velocity: 141.00 cm/s MV A velocity: 104.00 cm/s  SHUNTS MV E/A ratio:  1.36         Systemic VTI:  0.24 m                             Systemic Diam: 2.00 cm Buford Dresser MD Electronically signed by Buford Dresser MD Signature Date/Time: 03/12/2022/4:02:04 PM    Final    DG Chest Port 1 View  Result Date: 03/10/2022 CLINICAL DATA:  Questionable sepsis EXAM: PORTABLE CHEST 1 VIEW COMPARISON:  02/07/2022 FINDINGS: Left upper lobe and to lesser extent left lower lobe airspace disease most concerning for multilobar pneumonia. No pleural effusion or pneumothorax. Stable cardiomediastinal silhouette. No acute osseous abnormality. IMPRESSION: 1. Left upper lobe and to lesser extent left lower lobe airspace disease most concerning for multilobar pneumonia. Electronically Signed   By: Kathreen Devoid M.D.   On: 03/10/2022 09:42     Assessment and Plan:   Elevated troponin-troponins are 92 and 821.  However the patient has not had  chest pain other than when he coughs.  His echocardiogram shows normal LV function.  Likely demand ischemia in the setting of acute renal insufficiency and pneumonia.  I do not think further ischemia evaluation is indicated. Paroxysmal atrial fibrillation-patient did have atrial fibrillation at time of admission.  He has converted to sinus rhythm.  We will continue apixaban.  Continue metoprolol. Severe home O2 dependent COPD-pulmonary toilet per primary care. Pneumonia-complete antibiotics as directed by primary care. Acute kidney disease-creatinine is improving.  Continue to follow.  Likely component of  dehydration at time of admission. Hypertension-blood pressure elevated.  Will add amlodipine 5 mg daily. Hyperlipidemia-resume home dose of statin at discharge.  Cardiology will sign off.  We will arrange follow-up with [facility] 4 to 6 weeks after discharge.  Continue present medications as outlined above.  Please call with questions.  For questions or updates, please contact [place] Please consult [email or web address] for contact info under    Signed, Kirk Ruths, MD  03/13/2022 2:03 PM

## 2022-03-13 NOTE — Progress Notes (Signed)
PROGRESS NOTE        PATIENT DETAILS Name: Edward [name] Age: 87 y.o. Sex: male Date of Birth: Dec 24, 1934 Admit Date: 03/10/2022 Admitting Physician Kipp Brood, MD YIR:SWNIOEV, [name], MD  Brief Summary: Patient is a 87 y.o.  male with history of COPD with chronic hypoxic respiratory failure on 3 L of oxygen at home-who presented with acute on chronic hypoxemic respiratory failure, acute metabolic encephalopathy due to PNA and COPD exacerbation.  See below for further details.   Significant events: 6/9>> admit to ICU-due to severe encephalopathy/COPD exacerbation/PNA.  Significant studies: 6/9>> left upper lobe PNA. 6/10>> TTE: EF 60-65%, no motion abnormality, grade 2 diastolic dysfunction.  Significant microbiology data: 6/9>> COVID PCR: Negative 6/9>> culture: No growth 6/9>> blood culture: No growth  Procedures: None  Consults: PCCM, cardiology  Subjective: Still coughing-although improved-not yet at baseline.  Still wheezing.  Objective: Vitals: Blood pressure (!) 173/97, pulse 96, temperature 98 F (36.7 C), temperature source Oral, resp. rate 18, height '5\' 5"'$  (1.651 m), weight 73.1 kg, SpO2 96 %.   Exam: Gen Exam:Alert awake-not in any distress HEENT:atraumatic, normocephalic Chest: Moving air-still with rhonchi all over. CVS:S1S2 regular Abdomen:soft non tender, non distended Extremities:no edema Neurology: Non focal Skin: no rash  Pertinent Labs/Radiology:    Latest Ref Rng & Units 03/13/2022    2:10 AM 03/12/2022    1:03 AM 03/11/2022    6:09 AM  CBC  WBC 4.0 - 10.5 K/uL 14.8  15.6  13.9   Hemoglobin 13.0 - 17.0 g/dL 10.9  10.5  9.5   Hematocrit 39.0 - 52.0 % 34.3  32.4  29.9   Platelets 150 - 400 K/uL 324  302  253     Lab Results  Component Value Date   NA 135 03/13/2022   K 3.8 03/13/2022   CL 104 03/13/2022   CO2 25 03/13/2022      Assessment/Plan: Acute on chronic hypoxic respiratory failure due to PNA  and COPD exacerbation: Improved-still wheezing-not yet back to baseline-continue bronchodilators/antibiotics-switch to oral prednisone and plan a quick taper.    Acute metabolic encephalopathy: Due to hypoxia/PNA-resolved-he is significantly better-suspect he is back to his baseline.  A-fib with RVR: Occurred in the ICU-back in sinus rhythm-echo with stable EF.  On Eliquis and metoprolol.  Elevated troponins: Suspect this is due to demand ischemia in the setting of acute illness/PNA/A-fib RVR-Echo stable-have consulted cardiology to see if further work-up is needed in the setting given significant jump in his troponins.  Chronic HFrEF: Volume status reasonable-resume usual dosing of furosemide.  AKI on CKD stage IIIb: AKI hemodynamically mediated-stable in almost back to baseline.  Normocytic anemia: Due to combination of CKD and acute illness.  Follow periodically.  DM-2 (A1c 13.6 on 6/9) with uncontrolled hyperglycemia: Poor long-term control given significantly elevated A1c-starting Semglee at 12 units-continue SSI.  Unclear to me how compliant he is with his outpatient insulin regimen.  Recent Labs    03/12/22 2310 03/13/22 0319 03/13/22 0734  GLUCAP 219* 167* 203*   Deconditioning/debility: Walks with a walker at baseline-PT/OT eval appreciated-outpatient PT recommended.  BMI: Estimated body mass index is 26.82 kg/m as calculated from the following:   Height as of this encounter: '5\' 5"'$  (1.651 m).   Weight as of this encounter: 73.1 kg.   Code status:   Code Status: Full Code  DVT Prophylaxis: apixaban (ELIQUIS) tablet 2.5 mg Start: 03/11/22 1245 apixaban (ELIQUIS) tablet 2.5 mg    Family Communication: None at bedside-called son-[phone]-goes directly to voicemail-which has not yet been set up apparently.   Disposition Plan: Status is: Inpatient Remains inpatient appropriate because: Resolving hypoxia-PNA-awaiting cardiology evaluation-switching to oral  steroids-likely home on 6/13 if clinical improvement continues.   Planned Discharge Destination:Home health   Diet: Diet Order             DIET DYS 2 Room service appropriate? No; Fluid consistency: Thin  Diet effective now                     Antimicrobial agents: Anti-infectives (From admission, onward)    Start     Dose/Rate Route Frequency Ordered Stop   03/12/22 1700  azithromycin (ZITHROMAX) tablet 500 mg        500 mg Oral Daily 03/12/22 1339 03/15/22 1659   03/10/22 1545  cefTRIAXone (ROCEPHIN) 1 g in sodium chloride 0.9 % 100 mL IVPB        1 g 200 mL/hr over 30 Minutes Intravenous Every 24 hours 03/10/22 1455 03/17/22 1544   03/10/22 1545  azithromycin (ZITHROMAX) 500 mg in sodium chloride 0.9 % 250 mL IVPB  Status:  Discontinued        500 mg 250 mL/hr over 60 Minutes Intravenous Every 24 hours 03/10/22 1455 03/12/22 1339   03/10/22 0945  cefTRIAXone (ROCEPHIN) 1 g in sodium chloride 0.9 % 100 mL IVPB        1 g 200 mL/hr over 30 Minutes Intravenous  Once 03/10/22 0931 03/10/22 1024   03/10/22 0945  azithromycin (ZITHROMAX) 500 mg in sodium chloride 0.9 % 250 mL IVPB        500 mg 250 mL/hr over 60 Minutes Intravenous  Once 03/10/22 0931 03/10/22 1206        MEDICATIONS: Scheduled Meds:  apixaban  2.5 mg Oral BID   azithromycin  500 mg Oral Daily   budesonide (PULMICORT) nebulizer solution  0.25 mg Nebulization BID   Chlorhexidine Gluconate Cloth  6 each Topical Daily   Chlorhexidine Gluconate Cloth  6 each Topical Q0600   insulin aspart  0-15 Units Subcutaneous Q4H   ipratropium-albuterol  3 mL Nebulization TID   methylPREDNISolone (SOLU-MEDROL) injection  40 mg Intravenous Q24H   metoprolol tartrate  25 mg Oral BID   mometasone-formoterol  2 puff Inhalation BID   mupirocin ointment  1 application  Nasal BID   pantoprazole  40 mg Oral Daily   Continuous Infusions:  sodium chloride Stopped (03/10/22 1721)   cefTRIAXone (ROCEPHIN)  IV 1 g (03/12/22  1509)   PRN Meds:.docusate sodium, ipratropium-albuterol, polyethylene glycol   I have personally reviewed following labs and imaging studies  LABORATORY DATA: CBC: Recent Labs  Lab 03/10/22 0930 03/10/22 1019 03/10/22 1315 03/11/22 0609 03/12/22 0103 03/13/22 0210  WBC 18.5*  --   --  13.9* 15.6* 14.8*  NEUTROABS 13.8*  --   --  12.2* 14.1* 13.3*  HGB 12.3* 11.9* 10.5* 9.5* 10.5* 10.9*  HCT 38.6* 35.0* 31.0* 29.9* 32.4* 34.3*  MCV 87.1  --   --  86.2 87.3 86.6  PLT 259  --   --  253 302 841    Basic Metabolic Panel: Recent Labs  Lab 03/10/22 0930 03/10/22 1019 03/10/22 1315 03/11/22 0609 03/12/22 0103 03/13/22 0210  NA 135 131* 133* 136 135 135  K 5.1 5.6* 4.3 4.0 4.3 3.8  CL 95*  --   --  99 99 104  CO2 24  --   --  '25 25 25  '$ GLUCOSE 115*  --   --  87 259* 183*  BUN 62*  --   --  50* 56* 53*  CREATININE 2.61*  --   --  1.86* 1.94* 1.67*  CALCIUM 8.8*  --   --  8.0* 7.8* 7.7*  MG 2.4  --   --   --   --  2.7*    GFR: Estimated Creatinine Clearance: 28.6 mL/min (A) (by C-G formula based on SCr of 1.67 mg/dL (H)).  Liver Function Tests: Recent Labs  Lab 03/10/22 0930 03/11/22 0609 03/12/22 0103 03/13/22 0210  AST 42* 42* 28 17  ALT '18 22 22 23  '$ ALKPHOS 81 67 77 81  BILITOT 0.6 0.4 0.4 0.6  PROT 6.0* 5.2* 5.6* 5.9*  ALBUMIN 2.6* 2.1* 2.2* 2.3*   No results for input(s): "LIPASE", "AMYLASE" in the last 168 hours. No results for input(s): "AMMONIA" in the last 168 hours.  Coagulation Profile: Recent Labs  Lab 03/10/22 0930  INR 1.2    Cardiac Enzymes: No results for input(s): "CKTOTAL", "CKMB", "CKMBINDEX", "TROPONINI" in the last 168 hours.  BNP (last 3 results) No results for input(s): "PROBNP" in the last 8760 hours.  Lipid Profile: No results for input(s): "CHOL", "HDL", "LDLCALC", "TRIG", "CHOLHDL", "LDLDIRECT" in the last 72 hours.  Thyroid Function Tests: No results for input(s): "TSH", "T4TOTAL", "FREET4", "T3FREE", "THYROIDAB" in  the last 72 hours.  Anemia Panel: No results for input(s): "VITAMINB12", "FOLATE", "FERRITIN", "TIBC", "IRON", "RETICCTPCT" in the last 72 hours.  Urine analysis:    Component Value Date/Time   COLORURINE YELLOW 03/10/2022 1000   APPEARANCEUR CLOUDY (A) 03/10/2022 1000   LABSPEC 1.013 03/10/2022 1000   PHURINE 5.0 03/10/2022 1000   GLUCOSEU >=500 (A) 03/10/2022 1000   HGBUR SMALL (A) 03/10/2022 1000   BILIRUBINUR NEGATIVE 03/10/2022 1000   KETONESUR NEGATIVE 03/10/2022 1000   PROTEINUR >=300 (A) 03/10/2022 1000   NITRITE NEGATIVE 03/10/2022 1000   LEUKOCYTESUR NEGATIVE 03/10/2022 1000    Sepsis Labs: Lactic Acid, Venous    Component Value Date/Time   LATICACIDVEN 1.8 03/10/2022 1216    MICROBIOLOGY: Recent Results (from the past 240 hour(s))  Blood Culture (routine x 2)     Status: None (Preliminary result)   Collection Time: 03/10/22  9:24 AM   Specimen: BLOOD  Result Value Ref Range Status   Specimen Description BLOOD BLOOD LEFT FOREARM  Final   Special Requests   Final    BOTTLES DRAWN AEROBIC AND ANAEROBIC Blood Culture results may not be optimal due to an inadequate volume of blood received in culture bottles   Culture   Final    NO GROWTH 3 DAYS Performed at [facility], [place] [address] [place],  [address]    Report Status PENDING  Incomplete  SARS Coronavirus 2 by RT PCR (hospital order, performed in [facility] hospital lab) *cepheid single result test* Anterior Nasal Swab     Status: None   Collection Time: 03/10/22  9:29 AM   Specimen: Anterior Nasal Swab  Result Value Ref Range Status   SARS Coronavirus 2 by RT PCR NEGATIVE NEGATIVE Final    Comment: (NOTE) SARS-CoV-2 target nucleic acids are NOT DETECTED.  The SARS-CoV-2 RNA is generally detectable in upper and lower respiratory specimens during the acute phase of infection. The lowest concentration of SARS-CoV-2 viral copies this assay  can detect is 250 copies / mL. A negative result  does not preclude SARS-CoV-2 infection and should not be used as the sole basis for treatment or other patient management decisions.  A negative result may occur with improper specimen collection / handling, submission of specimen other than nasopharyngeal swab, presence of viral mutation(s) within the areas targeted by this assay, and inadequate number of viral copies (<250 copies / mL). A negative result must be combined with clinical observations, patient history, and epidemiological information.  Fact Sheet for Patients:   https://www.patel.info/  Fact Sheet for Healthcare Providers: https://hall.com/  This test is not yet approved or  cleared by the [place] FDA and has been authorized for detection and/or diagnosis of SARS-CoV-2 by FDA under an Emergency Use Authorization (EUA).  This EUA will remain in effect (meaning this test can be used) for the duration of the COVID-19 declaration under Section 564(b)(1) of the Act, 21 U.S.C. section 360bbb-3(b)(1), unless the authorization is terminated or revoked sooner.  Performed at [facility], [place] [address], [place], [place] [address]   Blood Culture (routine x 2)     Status: None (Preliminary result)   Collection Time: 03/10/22  9:30 AM   Specimen: BLOOD  Result Value Ref Range Status   Specimen Description BLOOD BLOOD LEFT HAND  Final   Special Requests   Final    BOTTLES DRAWN AEROBIC AND ANAEROBIC Blood Culture results may not be optimal due to an inadequate volume of blood received in culture bottles   Culture   Final    NO GROWTH 3 DAYS Performed at [facility], [place] [address], [place], [place] [address]    Report Status PENDING  Incomplete  Urine Culture     Status: None   Collection Time: 03/10/22 10:00 AM   Specimen: In/Out Cath Urine  Result Value Ref Range Status   Specimen Description IN/OUT CATH URINE  Final   Special Requests NONE  Final   Culture    Final    NO GROWTH Performed at [facility], [place] [address], [place], [place] [address]    Report Status 03/11/2022 FINAL  Final  MRSA Next Gen by PCR, Nasal     Status: Abnormal   Collection Time: 03/11/22  8:23 AM   Specimen: Nasal Mucosa; Nasal Swab  Result Value Ref Range Status   MRSA by PCR Next Gen DETECTED (A) NOT DETECTED Final    Comment: RESULT CALLED TO, READ BACK BY AND VERIFIED WITH: Theodosia Quay RN, AT 1352 03/11/22 D. VANHOOK (NOTE) The GeneXpert MRSA Assay (FDA approved for NASAL specimens only), is one component of a comprehensive MRSA colonization surveillance program. It is not intended to diagnose MRSA infection nor to guide or monitor treatment for MRSA infections. Test performance is not FDA approved in patients less than 11 years old. Performed at [facility], [place] [address] [place], [place] [address]     RADIOLOGY STUDIES/RESULTS: ECHOCARDIOGRAM COMPLETE  Result Date: 03/12/2022    ECHOCARDIOGRAM REPORT   Patient Name:   DRAYK HUMBARGER Date of Exam: 03/12/2022 Medical Rec #:  [id number]      Height:       65.0 in Accession #:    [id number]     Weight:       161.2 lb Date of Birth:  [birth year]      BSA:          1.805 m Patient Age:  86 years       BP:           141/79 mmHg Patient Gender: M              HR:           76 bpm. Exam Location:  Inpatient Procedure: 2D Echo, Cardiac Doppler and Color Doppler Indications:    Elevated troponin  History:        Patient has prior history of Echocardiogram examinations, most                 recent 04/17/2020. COPD, Arrythmias:Atrial Fibrillation; Risk                 Factors:Hypertension.  Sonographer:    Jefferey Pica Referring Phys: [id number] Schriever D Stan[place]  1. Left ventricular ejection fraction, by estimation, is 60 to 65%. The left ventricle has normal function. The left ventricle has no regional wall motion abnormalities. Left ventricular diastolic parameters are consistent with Grade II  diastolic dysfunction (pseudonormalization).  2. Right ventricular systolic function is normal. The right ventricular size is normal. There is mildly elevated pulmonary artery systolic pressure.  3. Left atrial size was mildly dilated.  4. The mitral valve is grossly normal. Trivial mitral valve regurgitation. No evidence of mitral stenosis.  5. The aortic valve is grossly normal. There is mild calcification of the aortic valve. There is mild thickening of the aortic valve. Aortic valve regurgitation is trivial. Mild aortic valve stenosis. Conclusion(s)/Recommendation(s): Otherwise normal echocardiogram, with minor abnormalities described in the report. FINDINGS  Left Ventricle: Left ventricular ejection fraction, by estimation, is 60 to 65%. The left ventricle has normal function. The left ventricle has no regional wall motion abnormalities. The left ventricular internal cavity size was normal in size. There is  no left ventricular hypertrophy. Left ventricular diastolic parameters are consistent with Grade II diastolic dysfunction (pseudonormalization). Right Ventricle: The right ventricular size is normal. No increase in right ventricular wall thickness. Right ventricular systolic function is normal. There is mildly elevated pulmonary artery systolic pressure. The tricuspid regurgitant velocity is 2.81  m/s, and with an assumed right atrial pressure of 8 mmHg, the estimated right ventricular systolic pressure is 40.0 mmHg. Left Atrium: Left atrial size was mildly dilated. Right Atrium: Right atrial size was normal in size. Pericardium: There is no evidence of pericardial effusion. Mitral Valve: The mitral valve is grossly normal. Mild mitral annular calcification. Trivial mitral valve regurgitation. No evidence of mitral valve stenosis. Tricuspid Valve: The tricuspid valve is normal in structure. Tricuspid valve regurgitation is trivial. No evidence of tricuspid stenosis. Aortic Valve: The aortic valve is grossly  normal. There is mild calcification of the aortic valve. There is mild thickening of the aortic valve. Aortic valve regurgitation is trivial. Mild aortic stenosis is present. Aortic valve peak gradient measures 20.2 mmHg. Pulmonic Valve: The pulmonic valve was not well visualized. Pulmonic valve regurgitation is not visualized. Aorta: The aortic arch was not well visualized and the aortic root and ascending aorta are structurally normal, with no evidence of dilitation. Venous: The inferior vena cava was not well visualized. IAS/Shunts: The atrial septum is grossly normal.  LEFT VENTRICLE PLAX 2D LVIDd:         5.10 cm   Diastology LVIDs:         2.80 cm   LV e' medial:    6.80 cm/s LV PW:         0.90 cm   LV  E/e' medial:  20.7 LV IVS:        1.30 cm   LV e' lateral:   12.30 cm/s LVOT diam:     2.00 cm   LV E/e' lateral: 11.5 LV SV:         76 LV SV Index:   42 LVOT Area:     3.14 cm  RIGHT VENTRICLE RV Basal diam:  3.20 cm RV S prime:     18.10 cm/s TAPSE (M-mode): 2.7 cm LEFT ATRIUM             Index        RIGHT ATRIUM           Index LA diam:        4.10 cm 2.27 cm/m   RA Area:     14.30 cm LA Vol (A2C):   62.7 ml 34.74 ml/m  RA Volume:   33.40 ml  18.51 ml/m LA Vol (A4C):   44.0 ml 24.38 ml/m LA Biplane Vol: 55.0 ml 30.48 ml/m  AORTIC VALVE                 PULMONIC VALVE AV Area (Vmax): 1.54 cm     PV Vmax:       0.87 m/s AV Vmax:        225.00 cm/s  PV Peak grad:  3.0 mmHg AV Peak Grad:   20.2 mmHg LVOT Vmax:      110.00 cm/s LVOT Vmean:     63.000 cm/s LVOT VTI:       0.241 m  AORTA Ao Root diam: 3.30 cm Ao Asc diam:  3.40 cm MITRAL VALVE                TRICUSPID VALVE MV Area (PHT): 3.53 cm     TR Peak grad:   31.6 mmHg MV Decel Time: 215 msec     TR Vmax:        281.00 cm/s MV E velocity: 141.00 cm/s MV A velocity: 104.00 cm/s  SHUNTS MV E/A ratio:  1.36         Systemic VTI:  0.24 m                             Systemic Diam: 2.00 cm Buford Dresser MD Electronically signed by Buford Dresser MD Signature Date/Time: 03/12/2022/4:02:04 PM    Final      LOS: 3 days   Oren Binet, MD  [facility]    To contact the attending provider between 7A-7P or the covering provider during after hours 7P-7A, please log into the web site [email or web address] and access using universal [place] password for that web site. If you do not have the password, please call the hospital operator.  03/13/2022, 9:28 AM

## 2022-03-13 NOTE — Inpatient Diabetes Management (Signed)
Inpatient Diabetes Program Recommendations  AACE/ADA: New Consensus Statement on Inpatient Glycemic Control (2015)  Target Ranges:  Prepandial:   less than 140 mg/dL      Peak postprandial:   less than 180 mg/dL (1-2 hours)      Critically ill patients:  140 - 180 mg/dL   Lab Results  Component Value Date   GLUCAP 346 (H) 03/13/2022   HGBA1C 13.6 (H) 03/10/2022    Review of Glycemic Control  Latest Reference Range & Units 03/12/22 07:47 03/12/22 11:40 03/12/22 16:30 03/12/22 19:39 03/12/22 23:10 03/13/22 03:19 03/13/22 07:34 03/13/22 11:45  Glucose-Capillary 70 - 99 mg/dL 110 (H) 226 (H) 149 (H) 215 (H) 219 (H) 167 (H) 203 (H) 346 (H)   Diabetes history: DM 2 Outpatient Diabetes medications: Lantus 45 units qhs Current orders for Inpatient glycemic control:  Semglee 12 units Daily Novolog 0-15 units Q4  PO prednisone 40 mg Daily A1c 13.6% on 6/9  Attempted to speak with pt regarding A1c and diabetes control at home. Pt did not seem to know much information. He did say he did his own medication and later reported his son helped to check his blood sugar at home. I received permission to call his son.  Attempted to call number in chart for his son. Phone went directly to voicemail. Will try at a later time to speak with pt or call family.  Thanks,  Tama Headings RN, MSN, BC-ADM Inpatient Diabetes Coordinator Team Pager [phone] (8a-5p)

## 2022-03-13 NOTE — TOC Initial Note (Addendum)
Transition of Care (TOC) - Initial/Assessment Note    Patient Details  Name: Edward [name] MRN: [id number] Date of Birth: 07/16/1935  Transition of Care (TOC) CM/SW Contact:     F , RN Phone Number: 03/13/2022, 11:09 AM  Clinical Narrative:                 CM met with the patient at the bedside. He was having some confusion. CM inquired about talking to his son and he was in agreement. CM has left a message for the patients son.  Pt states his son provides all needed transportation and oversees his medications.  TOC awaiting to talk with son.   1523: have attempted son x3 and went by the patients room twice and no family available.   Expected Discharge Plan: Home/Self Care Barriers to Discharge: Continued Medical Work up   Patient Goals and CMS Choice        Expected Discharge Plan and Services Expected Discharge Plan: Home/Self Care   Discharge Planning Services: CM Consult   Living arrangements for the past 2 months: Single Family Home                                      Prior Living Arrangements/Services Living arrangements for the past 2 months: Single Family Home Lives with:: Adult Children, Spouse Patient language and need for interpreter reviewed:: Yes Do you feel safe going back to the place where you live?: Yes          Current home services: DME (walker/ shower seat) Criminal Activity/Legal Involvement Pertinent to Current Situation/Hospitalization: No - Comment as needed  Activities of Daily Living      Permission Sought/Granted                  Emotional Assessment Appearance:: Appears stated age Attitude/Demeanor/Rapport: Engaged Affect (typically observed): Calm Orientation: : Oriented to Self, Oriented to Place   Psych Involvement: No (comment)  Admission diagnosis:  Respiratory failure ([name]) [J96.90] Patient Active Problem List   Diagnosis Date Noted   Septic shock ([name]) 03/10/2022   Rib pain on right side  03/30/2021   Sepsis due to methicillin resistant Staphylococcus aureus (MRSA) ([name]) 04/20/2020   MRSA bacteremia 04/16/2020   AKI (acute kidney injury) ([name]) 04/16/2020   Acute metabolic encephalopathy 04/14/2020   Altered mental status 03/23/2020   Normocytic anemia 03/23/2020   Mixed hyperlipidemia 03/06/2019   Parotitis 11/01/2018   Syncope 10/30/2018   Facial cellulitis 10/30/2018   Chronic respiratory failure with hypoxia ([name]) 10/30/2018   Chronic diastolic heart failure ([name])    Hypoxia    Atrial fibrillation ([name])    COPD with acute exacerbation ([name]) 07/31/2018   Hyponatremia 07/31/2018   COPD (chronic obstructive pulmonary disease) ([name]) 05/21/2018   Stage 3 chronic kidney disease ([name]) 05/21/2018   Diastolic congestive heart failure ([name]) 05/21/2018   Type 2 diabetes mellitus ([name]) 05/21/2018   HTN (hypertension) 05/21/2018   Iron deficiency anemia due to chronic blood loss 04/30/2018   Respiratory distress    Hematochezia    Goals of care, counseling/discussion    Palliative care by specialist    DNR (do not resuscitate) discussion    Acute renal failure with acute tubular necrosis superimposed on stage 3 chronic kidney disease ([name])    Palliative care encounter    COPD exacerbation ([name]) 04/17/2018   Acute on chronic diastolic CHF (congestive heart   failure) ([place])    Severe anemia 02/17/2018   Bradycardia 02/17/2018   Hypothermia    Gastroesophageal reflux disease    Acute encephalopathy 09/25/2017   On home O2 09/25/2017   Bilateral lower extremity edema 06/27/2017   Insomnia 03/28/2017   CKD (chronic kidney disease), stage III ([place]) 03/07/2017   AF (paroxysmal atrial fibrillation) ([name]) 03/05/2017   Chronic diastolic CHF (congestive heart failure) ([place]) 03/04/2017   Community acquired pneumonia of left upper lobe of lung 03/04/2017   Atrial fibrillation with rapid ventricular response ([place])    Sepsis due to pneumonia ([place]) 02/25/2017   Constipation  02/25/2017   Overflow diarrhea/Constipation 02/25/2017   DM type 2 causing vascular disease ([place]) 10/11/2016   Non compliance w medication regimen 12/02/2015   Hypercholesterolemia 05/12/2014   Acute and chronic respiratory failure with hypoxia ([place]) 11/17/2013   Elevated PSA 01/20/2013   Hypertension    Elevated lipids    Pulmonary fibrosis ([place])    Colon polyps    Colon polyps    PCP:  Susy Frizzle, MD Pharmacy:   [place], [place] - [address] [place] [address] Phone: [phone] Fax: [phone]  [facility] [place], [place] [address] [place] [address] Phone: [phone] Fax: [place], [place] [address] Phone: [phone] Fax: [phone]     Social Determinants of Health (SDOH) Interventions    Readmission Risk Interventions    04/20/2020    2:57 PM 04/19/2020    4:27 PM 04/19/2020   11:18 AM  Readmission Risk Prevention Plan  Transportation Screening   Complete  Medication Review (RN Care Manager)   Complete  PCP or Specialist appointment within 3-5 days of discharge Not Complete    PCP/Specialist Appt Not Complete comments appointment is scheduled for 04/26/20 which is six days. Labs to are to be drawn every Monday per attending request.    [place] or [place]  Complete   SW Recovery Care/Counseling Consult   Complete  Palliative Care Screening   Not Applicable

## 2022-03-14 ENCOUNTER — Other Ambulatory Visit (HOSPITAL_COMMUNITY): Payer: Self-pay

## 2022-03-14 ENCOUNTER — Encounter (HOSPITAL_COMMUNITY): Payer: Self-pay | Admitting: Internal Medicine

## 2022-03-14 DIAGNOSIS — J189 Pneumonia, unspecified organism: Secondary | ICD-10-CM

## 2022-03-14 DIAGNOSIS — J9621 Acute and chronic respiratory failure with hypoxia: Secondary | ICD-10-CM | POA: Diagnosis not present

## 2022-03-14 DIAGNOSIS — I4891 Unspecified atrial fibrillation: Secondary | ICD-10-CM | POA: Diagnosis not present

## 2022-03-14 DIAGNOSIS — N179 Acute kidney failure, unspecified: Secondary | ICD-10-CM | POA: Diagnosis not present

## 2022-03-14 LAB — CBC WITH DIFFERENTIAL/PLATELET
Abs Immature Granulocytes: 0.47 10*3/uL — ABNORMAL HIGH (ref 0.00–0.07)
Basophils Absolute: 0.1 10*3/uL (ref 0.0–0.1)
Basophils Relative: 0 %
Eosinophils Absolute: 0 10*3/uL (ref 0.0–0.5)
Eosinophils Relative: 0 %
HCT: 34.2 % — ABNORMAL LOW (ref 39.0–52.0)
Hemoglobin: 11.2 g/dL — ABNORMAL LOW (ref 13.0–17.0)
Immature Granulocytes: 3 %
Lymphocytes Relative: 5 %
Lymphs Abs: 0.8 10*3/uL (ref 0.7–4.0)
MCH: 27.8 pg (ref 26.0–34.0)
MCHC: 32.7 g/dL (ref 30.0–36.0)
MCV: 84.9 fL (ref 80.0–100.0)
Monocytes Absolute: 1.5 10*3/uL — ABNORMAL HIGH (ref 0.1–1.0)
Monocytes Relative: 9 %
Neutro Abs: 15 10*3/uL — ABNORMAL HIGH (ref 1.7–7.7)
Neutrophils Relative %: 83 %
Platelets: 353 10*3/uL (ref 150–400)
RBC: 4.03 MIL/uL — ABNORMAL LOW (ref 4.22–5.81)
RDW: 14.3 % (ref 11.5–15.5)
WBC: 17.8 10*3/uL — ABNORMAL HIGH (ref 4.0–10.5)
nRBC: 0 % (ref 0.0–0.2)

## 2022-03-14 LAB — GLUCOSE, CAPILLARY
Glucose-Capillary: 107 mg/dL — ABNORMAL HIGH (ref 70–99)
Glucose-Capillary: 130 mg/dL — ABNORMAL HIGH (ref 70–99)
Glucose-Capillary: 84 mg/dL (ref 70–99)

## 2022-03-14 LAB — COMPREHENSIVE METABOLIC PANEL
ALT: 21 U/L (ref 0–44)
AST: 19 U/L (ref 15–41)
Albumin: 2.3 g/dL — ABNORMAL LOW (ref 3.5–5.0)
Alkaline Phosphatase: 82 U/L (ref 38–126)
Anion gap: 9 (ref 5–15)
BUN: 55 mg/dL — ABNORMAL HIGH (ref 8–23)
CO2: 27 mmol/L (ref 22–32)
Calcium: 8 mg/dL — ABNORMAL LOW (ref 8.9–10.3)
Chloride: 101 mmol/L (ref 98–111)
Creatinine, Ser: 1.6 mg/dL — ABNORMAL HIGH (ref 0.61–1.24)
GFR, Estimated: 42 mL/min — ABNORMAL LOW (ref >60)
Glucose, Bld: 137 mg/dL — ABNORMAL HIGH (ref 70–99)
Potassium: 3.6 mmol/L (ref 3.5–5.1)
Sodium: 137 mmol/L (ref 135–145)
Total Bilirubin: 0.6 mg/dL (ref 0.3–1.2)
Total Protein: 5.7 g/dL — ABNORMAL LOW (ref 6.5–8.1)

## 2022-03-14 MED ORDER — APIXABAN 2.5 MG PO TABS
2.5000 mg | ORAL_TABLET | Freq: Two times a day (BID) | ORAL | 2 refills | Status: DC
  Filled 2022-03-14: qty 60, 30d supply, fill #0

## 2022-03-14 MED ORDER — PIOGLITAZONE HCL 30 MG PO TABS
30.0000 mg | ORAL_TABLET | Freq: Every day | ORAL | 3 refills | Status: DC
Start: 2022-03-14 — End: 2022-04-24
  Filled 2022-03-14: qty 30, 30d supply, fill #0

## 2022-03-14 MED ORDER — CEFDINIR 300 MG PO CAPS
300.0000 mg | ORAL_CAPSULE | Freq: Two times a day (BID) | ORAL | 0 refills | Status: AC
  Filled 2022-03-14: qty 4, 2d supply, fill #0

## 2022-03-14 MED ORDER — PREDNISONE 10 MG PO TABS
ORAL_TABLET | ORAL | 0 refills | Status: DC
  Filled 2022-03-14: qty 10, 4d supply, fill #0

## 2022-03-14 MED ORDER — SPIRIVA HANDIHALER 18 MCG IN CAPS
18.0000 ug | ORAL_CAPSULE | Freq: Every day | RESPIRATORY_TRACT | 2 refills | Status: DC
  Filled 2022-03-14: qty 30, 30d supply, fill #0

## 2022-03-14 MED ORDER — LANTUS SOLOSTAR 100 UNIT/ML ~~LOC~~ SOPN
15.0000 [IU] | PEN_INJECTOR | Freq: Every day | SUBCUTANEOUS | 11 refills | Status: DC
  Filled 2022-03-14: qty 6, 40d supply, fill #0

## 2022-03-14 NOTE — Plan of Care (Signed)
  Problem: Education: Goal: Knowledge of General Education information will improve Description: Including pain rating scale, medication(s)/side effects and non-pharmacologic comfort measures Outcome: Adequate for Discharge   Problem: Health Behavior/Discharge Planning: Goal: Ability to manage health-related needs will improve Outcome: Adequate for Discharge   Problem: Clinical Measurements: Goal: Ability to maintain clinical measurements within normal limits will improve Outcome: Adequate for Discharge Goal: Will remain free from infection Outcome: Adequate for Discharge Goal: Diagnostic test results will improve Outcome: Adequate for Discharge Goal: Respiratory complications will improve Outcome: Adequate for Discharge Goal: Cardiovascular complication will be avoided Outcome: Adequate for Discharge   Problem: Activity: Goal: Risk for activity intolerance will decrease Outcome: Adequate for Discharge   Problem: Nutrition: Goal: Adequate nutrition will be maintained Outcome: Adequate for Discharge   Problem: Coping: Goal: Level of anxiety will decrease Outcome: Adequate for Discharge   Problem: Elimination: Goal: Will not experience complications related to bowel motility Outcome: Adequate for Discharge Goal: Will not experience complications related to urinary retention Outcome: Adequate for Discharge   Problem: Pain Managment: Goal: General experience of comfort will improve Outcome: Adequate for Discharge   Problem: Safety: Goal: Ability to remain free from injury will improve Outcome: Adequate for Discharge   Problem: Skin Integrity: Goal: Risk for impaired skin integrity will decrease Outcome: Adequate for Discharge   Problem: Education: Goal: Ability to describe self-care measures that may prevent or decrease complications (Diabetes Survival Skills Education) will improve Outcome: Adequate for Discharge Goal: Individualized Educational Video(s) Outcome:  Adequate for Discharge   Problem: Coping: Goal: Ability to adjust to condition or change in health will improve Outcome: Adequate for Discharge   Problem: Fluid Volume: Goal: Ability to maintain a balanced intake and output will improve Outcome: Adequate for Discharge   Problem: Health Behavior/Discharge Planning: Goal: Ability to identify and utilize available resources and services will improve Outcome: Adequate for Discharge Goal: Ability to manage health-related needs will improve Outcome: Adequate for Discharge   Problem: Metabolic: Goal: Ability to maintain appropriate glucose levels will improve Outcome: Adequate for Discharge   Problem: Nutritional: Goal: Maintenance of adequate nutrition will improve Outcome: Adequate for Discharge Goal: Progress toward achieving an optimal weight will improve Outcome: Adequate for Discharge   Problem: Skin Integrity: Goal: Risk for impaired skin integrity will decrease Outcome: Adequate for Discharge   Problem: Tissue Perfusion: Goal: Adequacy of tissue perfusion will improve Outcome: Adequate for Discharge   Problem: Safety: Goal: Non-violent Restraint(s) Outcome: Adequate for Discharge

## 2022-03-14 NOTE — Consult Note (Signed)
   [facility] CM Inpatient Consult   03/14/2022  Shin ZAIDIN BLYDEN 1935-04-12 [id number]  [facility] [ACO] Patient: [employer] Medicare  Primary Care Provider:  Susy Frizzle, MD, with [facility], is an embedded provider with a Chronic Care Management team and program, and is listed for the transition of care follow up and appointments.  Patient was screened for Embedded practice service needs for chronic care management for high risk for unplanned readmission risk score. Patient is currently showing active in the Embedded CCM for diabetes management noted with Embedded Pharmacist encounters,  Last 6/9/23Hgb A1C showing as 13.6 noted.  Paatient to transition home with home health PT/OT noted by inpatient [facility] team note.  Plan:  Will update Embedded Care Management any additional  for post hospital follow up needs.  Please contact for further questions,  Natividad Brood, RN BSN [facility] Liaison  [phone] business mobile phone Toll free office [phone]  Fax number: [phone] [email or web address]

## 2022-03-14 NOTE — TOC Transition Note (Signed)
Transition of Care [facility]) - CM/SW Discharge Note   Patient Details  Name: Edward [name] MRN: [id number] Date of Birth: 12-01-1934  Transition of Care [facility]) CM/SW Contact:  Carles Collet, RN Phone Number: 03/14/2022, 9:19 AM   Clinical Narrative:    Spoke to patient's son over the phone. Discussed needs for DC He cited need for 3/1 and RW. Order placed and requested to be brought to room for DC through [facility] does not have preference for [facility] agency, referral placed to [facility], pending       Barriers to Discharge: No Barriers Identified   Patient Goals and CMS Choice Patient states their goals for this hospitalization and ongoing recovery are:: spoke w son, goal is home CMS Medicare.gov Compare Post Acute Care list provided to:: Other (Comment Required) Choice offered to / list presented to : Adult Children  Discharge Placement                       Discharge Plan and Services   Discharge Planning Services: CM Consult Post Acute Care Choice: Home Health, Durable Medical Equipment          DME Arranged: 3-N-1, Walker rolling DME Agency: [facility] Date DME Agency Contacted: 03/14/22 Time DME Agency Contacted: [phone] Representative spoke with at DME Agency: Brenton Grills HH Arranged: PT, OT [place] Agency: [place] Date [place]: 03/14/22 Time [place]: 0919 Representative spoke with at [place]: Amy (referral pending)  Social Determinants of Health ([place]) Interventions     Readmission Risk Interventions    04/20/2020    2:57 PM 04/19/2020    4:27 PM 04/19/2020   11:18 AM  Readmission Risk Prevention Plan  Transportation Screening   Complete  Medication Review (RN Care Manager)   Complete  PCP or Specialist appointment within 3-5 days of discharge Not Complete    PCP/Specialist Appt Not Complete comments appointment is scheduled for 04/26/20 which is six days. Labs to are to be drawn every Monday per attending request.     [place] or [place]  Complete   SW Recovery Care/Counseling Consult   Complete  Palliative Care Screening   Not Applicable

## 2022-03-14 NOTE — Discharge Summary (Signed)
PATIENT DETAILS Name: Edward [name] Age: 87 y.o. Sex: male Date of Birth: [birth year] MRN: [id number]. Admitting Physician: Kipp Brood, MD ATF:[id number], [name], MD  Admit Date: 03/10/2022 Discharge date: 03/14/2022  Recommendations for Outpatient Follow-up:  Follow up with PCP in 1-2 weeks Please obtain CMP/CBC in one week  Admitted From:  Home  Disposition: Home   Discharge Condition: good  CODE STATUS:   Code Status: Full Code   Diet recommendation:  Diet Order             Diet - low sodium heart healthy           Diet Carb Modified           DIET DYS 2 Room service appropriate? No; Fluid consistency: Thin  Diet effective now                    Brief Summary: Patient is a 87 y.o.  male with history of COPD with chronic hypoxic respiratory failure on 3 L of oxygen at home-who presented with acute on chronic hypoxemic respiratory failure, acute metabolic encephalopathy due to PNA and COPD exacerbation.  See below for further details.     Significant events: 6/9>> admit to ICU-due to severe encephalopathy/COPD exacerbation/PNA.   Significant studies: 6/9>> left upper lobe PNA. 6/10>> TTE: EF 60-65%, no motion abnormality, grade 2 diastolic dysfunction.   Significant microbiology data: 6/9>> COVID PCR: Negative 6/9>> culture: No growth 6/9>> blood culture: No growth   Procedures: None   Consults: PCCM, cardiology  Brief Hospital Course: Acute on chronic hypoxic respiratory failure due to PNA and COPD exacerbation: Improved-claims he is back to baseline-continue bronchodilator regimen-oral antibiotics and tapering steroids.  Follow with PCP/primary pulmonologist.      Acute metabolic encephalopathy: Due to hypoxia/PNA-resolved-he is significantly better-suspect he is back to his baseline.   A-fib with RVR: Occurred in the ICU-back in sinus rhythm-echo with stable EF.  On Eliquis and metoprolol.  Elevated troponins: Suspect this is due to  demand ischemia in the setting of acute illness/PNA/A-fib RVR-Echo with stable EF-appreciate cardiology input-no further work-up required.     Chronic HFrEF: Volume status reasonable-resume usual dosing of furosemide.   AKI on CKD stage IIIb: AKI hemodynamically mediated-stable in almost back to baseline.   Normocytic anemia: Due to combination of CKD and acute illness.  Follow periodically.   DM-2 (A1c 13.6 on 6/9) with uncontrolled hyperglycemia: Poor long-term control given significantly elevated A1c-Per son who I spoke to today-apparently patient takes 45 units of Lantus at home-he no longer is taking his Actos for unknown reason.  He is requiring already on around 12 units of Semglee here in the hospital-suspect he is noncompliant to diet-I have asked the son to slowly increase his Lantus regimen back to 45 units if his sugars start going up.  Per patient's son he checks his father sugar several times a day.  Deconditioning/debility: Walks with a walker at baseline-PT/OT eval appreciated-home health PT recommended.   BMI: Estimated body mass index is 26.82 kg/m as calculated from the following:   Height as of this encounter: '5\' 5"'$  (1.651 m).   Weight as of this encounter: 73.1 kg.   Discharge Diagnoses:  Active Problems:   Acute and chronic respiratory failure with hypoxia ([name])   Atrial fibrillation with rapid ventricular response ([place])   Community acquired pneumonia of left upper lobe of lung   AKI (acute kidney injury) ([place])   Septic shock ([place])   Discharge  Instructions:  Activity:  As tolerated   Discharge Instructions     Call MD for:  difficulty breathing, headache or visual disturbances   Complete by: As directed    Call MD for:  extreme fatigue   Complete by: As directed    Call MD for:  persistant dizziness or light-headedness   Complete by: As directed    Diet - low sodium heart healthy   Complete by: As directed    Diet Carb Modified   Complete by: As  directed    Discharge instructions   Complete by: As directed    Follow with Primary MD  Susy Frizzle, MD in 1-2 weeks  Please check your blood sugars several times a day-your blood glucose is controlled with just 12 units of Semglee here in the hospital.  Start your Lantus at 15 units daily-if your sugars start increasing-gradually increase your Lantus back to 45 units daily.  Please ensure that you are on a diabetic diet.  Your dose of Eliquis has been reduced to 2.5 mg-as you have chronic kidney disease at baseline.  Please get a complete blood count and chemistry panel checked by your Primary MD at your next visit, and again as instructed by your Primary MD.  Please ask your primary care practitioner to repeat a two-view chest x-ray in 4 to 6 weeks to ensure resolution of pneumonia.  If you still have changes on your repeat chest x-ray-you may require a CT chest to rule out malignancy/cancer.  Get Medicines reviewed and adjusted: Please take all your medications with you for your next visit with your Primary MD  Laboratory/radiological data: Please request your Primary MD to go over all hospital tests and procedure/radiological results at the follow up, please ask your Primary MD to get all Hospital records sent to his/her office.  In some cases, they will be blood work, cultures and biopsy results pending at the time of your discharge. Please request that your primary care M.D. follows up on these results.  Also Note the following: If you experience worsening of your admission symptoms, develop shortness of breath, life threatening emergency, suicidal or homicidal thoughts you must seek medical attention immediately by calling 911 or calling your MD immediately  if symptoms less severe.  You must read complete instructions/literature along with all the possible adverse reactions/side effects for all the Medicines you take and that have been prescribed to you. Take any new Medicines  after you have completely understood and accpet all the possible adverse reactions/side effects.   Do not drive when taking Pain medications or sleeping medications (Benzodaizepines)  Do not take more than prescribed Pain, Sleep and Anxiety Medications. It is not advisable to combine anxiety,sleep and pain medications without talking with your primary care practitioner  Special Instructions: If you have smoked or chewed Tobacco  in the last 2 yrs please stop smoking, stop any regular Alcohol  and or any Recreational drug use.  Wear Seat belts while driving.  Please note: You were cared for by a hospitalist during your hospital stay. Once you are discharged, your primary care physician will handle any further medical issues. Please note that NO REFILLS for any discharge medications will be authorized once you are discharged, as it is imperative that you return to your primary care physician (or establish a relationship with a primary care physician if you do not have one) for your post hospital discharge needs so that they can reassess your need for medications and monitor  your lab values.   Increase activity slowly   Complete by: As directed       Allergies as of 03/14/2022       Reactions   Ace Inhibitors Other (See Comments)   Hyperkalemia--07/23/2013:patient states not familiar with the following allergy        Medication List     STOP taking these medications    aspirin EC 81 MG tablet   cloNIDine 0.1 MG tablet Commonly known as: CATAPRES   gabapentin 100 MG capsule Commonly known as: NEURONTIN   ipratropium-albuterol 0.5-2.5 (3) MG/3ML Soln Commonly known as: DUONEB   lidocaine 4 % Commonly known as: HM Lidocaine Patch   losartan 25 MG tablet Commonly known as: Cozaar   SYMBICORT IN   tamsulosin 0.4 MG Caps capsule Commonly known as: FLOMAX   traMADol 50 MG tablet Commonly known as: ULTRAM       TAKE these medications    acetaminophen 500 MG  tablet Commonly known as: TYLENOL Take 500 mg by mouth every 6 (six) hours as needed for headache.   albuterol (2.5 MG/3ML) 0.083% nebulizer solution Commonly known as: PROVENTIL INHALE 3ML BY NEBULIZATION ROUTE EVERY 6 HOURS AS NEEDED FOR WHEEZING OR SHORTNESS OF BREATH What changed: See the new instructions.   apixaban 2.5 MG Tabs tablet Commonly known as: Eliquis Take 1 tablet (2.5 mg total) by mouth 2 (two) times daily. What changed:  medication strength how much to take   cefdinir 300 MG capsule Commonly known as: OMNICEF Take 1 capsule (300 mg total) by mouth 2 (two) times daily for 2 days.   fluticasone-salmeterol 500-50 MCG/ACT Aepb Commonly known as: Wixela Inhub Inhale 1 puff into the lungs in the morning and at bedtime. This replaces symbicort,   furosemide 40 MG tablet Commonly known as: LASIX Take 1 tablet (40 mg total) by mouth daily.   Lantus SoloStar 100 UNIT/ML Solostar Pen Generic drug: insulin glargine Inject 15 Units into the skin at bedtime. What changed: how much to take   metoprolol tartrate 25 MG tablet Commonly known as: LOPRESSOR TAKE 1 TABLET BY MOUTH TWICE DAILY   OneTouch Delica Plus [id number] Misc USE TO CHECK BLOOD SUGAR TWICE DAILY AS DIRECTED   OneTouch Ultra test strip Generic drug: glucose blood CHECK FASTING BLOOD SUGAR TWICE DAILY   OXYGEN Inhale 3 L into the lungs continuous.   pioglitazone 30 MG tablet Commonly known as: Actos Take 1 tablet (30 mg total) by mouth daily.   potassium chloride SA 20 MEQ tablet Commonly known as: KLOR-CON M Take 1 tablet by mouth once daily   pravastatin 80 MG tablet Commonly known as: PRAVACHOL TAKE 1 TABLET BY MOUTH AT BEDTIME   predniSONE 10 MG tablet Commonly known as: DELTASONE Take 40 mg daily for 1 day, 30 mg daily for 1 day, 20 mg daily for 1 days,10 mg daily for 1 day, then stop What changed:  medication strength how much to take how to take this when to take  this additional instructions   pregabalin 100 MG capsule Commonly known as: LYRICA Take 1 capsule (100 mg total) by mouth 2 (two) times daily. What changed:  when to take this reasons to take this   Spiriva HandiHaler 18 MCG inhalation capsule Generic drug: tiotropium Place 1 capsule (18 mcg total) into inhaler and inhale daily.        Follow-up Information     Susy Frizzle, MD. Schedule an appointment as soon as possible for a visit  in 1 week(s).   Specialty: Family Medicine Why: Hospital follow up, Repeat electrolytes, Repeat Complete Blood Count repeat chest x-ray or CT chest in 4 to 6 weeks to ensure resolution of pneumonia. Contact information: [address] [place] [address] [phone]                Allergies  Allergen Reactions   Ace Inhibitors Other (See Comments)    Hyperkalemia--07/23/2013:patient states not familiar with the following allergy     Other Procedures/Studies: ECHOCARDIOGRAM COMPLETE  Result Date: 03/12/2022    ECHOCARDIOGRAM REPORT   Patient Name:   Edward [name] [place] Date of Exam: 03/12/2022 Medical Rec #:  [id number]      Height:       65.0 in Accession #:    [id number]     Weight:       161.2 lb Date of Birth:  1934-10-06      BSA:          1.805 m Patient Age:    66 years       BP:           141/79 mmHg Patient Gender: M              HR:           76 bpm. Exam Location:  Inpatient Procedure: 2D Echo, Cardiac Doppler and Color Doppler Indications:    Elevated troponin  History:        Patient has prior history of Echocardiogram examinations, most                 recent 04/17/2020. COPD, Arrythmias:Atrial Fibrillation; Risk                 Factors:Hypertension.  Sonographer:    Jefferey Pica Referring Phys: [id number] [place]  1. Left ventricular ejection fraction, by estimation, is 60 to 65%. The left ventricle has normal function. The left ventricle has no regional wall motion abnormalities. Left ventricular  diastolic parameters are consistent with Grade II diastolic dysfunction (pseudonormalization).  2. Right ventricular systolic function is normal. The right ventricular size is normal. There is mildly elevated pulmonary artery systolic pressure.  3. Left atrial size was mildly dilated.  4. The mitral valve is grossly normal. Trivial mitral valve regurgitation. No evidence of mitral stenosis.  5. The aortic valve is grossly normal. There is mild calcification of the aortic valve. There is mild thickening of the aortic valve. Aortic valve regurgitation is trivial. Mild aortic valve stenosis. Conclusion(s)/Recommendation(s): Otherwise normal echocardiogram, with minor abnormalities described in the report. FINDINGS  Left Ventricle: Left ventricular ejection fraction, by estimation, is 60 to 65%. The left ventricle has normal function. The left ventricle has no regional wall motion abnormalities. The left ventricular internal cavity size was normal in size. There is  no left ventricular hypertrophy. Left ventricular diastolic parameters are consistent with Grade II diastolic dysfunction (pseudonormalization). Right Ventricle: The right ventricular size is normal. No increase in right ventricular wall thickness. Right ventricular systolic function is normal. There is mildly elevated pulmonary artery systolic pressure. The tricuspid regurgitant velocity is 2.81  m/s, and with an assumed right atrial pressure of 8 mmHg, the estimated right ventricular systolic pressure is 02.6 mmHg. Left Atrium: Left atrial size was mildly dilated. Right Atrium: Right atrial size was normal in size. Pericardium: There is no evidence of pericardial effusion. Mitral Valve: The mitral valve is grossly normal. Mild mitral annular calcification. Trivial mitral  valve regurgitation. No evidence of mitral valve stenosis. Tricuspid Valve: The tricuspid valve is normal in structure. Tricuspid valve regurgitation is trivial. No evidence of tricuspid  stenosis. Aortic Valve: The aortic valve is grossly normal. There is mild calcification of the aortic valve. There is mild thickening of the aortic valve. Aortic valve regurgitation is trivial. Mild aortic stenosis is present. Aortic valve peak gradient measures 20.2 mmHg. Pulmonic Valve: The pulmonic valve was not well visualized. Pulmonic valve regurgitation is not visualized. Aorta: The aortic arch was not well visualized and the aortic root and ascending aorta are structurally normal, with no evidence of dilitation. Venous: The inferior vena cava was not well visualized. IAS/Shunts: The atrial septum is grossly normal.  LEFT VENTRICLE PLAX 2D LVIDd:         5.10 cm   Diastology LVIDs:         2.80 cm   LV e' medial:    6.80 cm/s LV PW:         0.90 cm   LV E/e' medial:  20.7 LV IVS:        1.30 cm   LV e' lateral:   12.30 cm/s LVOT diam:     2.00 cm   LV E/e' lateral: 11.5 LV SV:         76 LV SV Index:   42 LVOT Area:     3.14 cm  RIGHT VENTRICLE RV Basal diam:  3.20 cm RV S prime:     18.10 cm/s TAPSE (M-mode): 2.7 cm LEFT ATRIUM             Index        RIGHT ATRIUM           Index LA diam:        4.10 cm 2.27 cm/m   RA Area:     14.30 cm LA Vol (A2C):   62.7 ml 34.74 ml/m  RA Volume:   33.40 ml  18.51 ml/m LA Vol (A4C):   44.0 ml 24.38 ml/m LA Biplane Vol: 55.0 ml 30.48 ml/m  AORTIC VALVE                 PULMONIC VALVE AV Area (Vmax): 1.54 cm     PV Vmax:       0.87 m/s AV Vmax:        225.00 cm/s  PV Peak grad:  3.0 mmHg AV Peak Grad:   20.2 mmHg LVOT Vmax:      110.00 cm/s LVOT Vmean:     63.000 cm/s LVOT VTI:       0.241 m  AORTA Ao Root diam: 3.30 cm Ao Asc diam:  3.40 cm MITRAL VALVE                TRICUSPID VALVE MV Area (PHT): 3.53 cm     TR Peak grad:   31.6 mmHg MV Decel Time: 215 msec     TR Vmax:        281.00 cm/s MV E velocity: 141.00 cm/s MV A velocity: 104.00 cm/s  SHUNTS MV E/A ratio:  1.36         Systemic VTI:  0.24 m                             Systemic Diam: 2.00 cm Buford Dresser MD Electronically signed by Buford Dresser MD Signature Date/Time: 03/12/2022/4:02:04 PM    Final    DG  Chest Port 1 View  Result Date: 03/10/2022 CLINICAL DATA:  Questionable sepsis EXAM: PORTABLE CHEST 1 VIEW COMPARISON:  02/07/2022 FINDINGS: Left upper lobe and to lesser extent left lower lobe airspace disease most concerning for multilobar pneumonia. No pleural effusion or pneumothorax. Stable cardiomediastinal silhouette. No acute osseous abnormality. IMPRESSION: 1. Left upper lobe and to lesser extent left lower lobe airspace disease most concerning for multilobar pneumonia. Electronically Signed   By: Kathreen Devoid M.D.   On: 03/10/2022 09:42     TODAY-DAY OF DISCHARGE:  Subjective:   Edward [name] today has no headache,no chest abdominal pain,no new weakness tingling or numbness, feels much better wants to go home today.  Objective:   Blood pressure (!) 150/76, pulse 85, temperature 98.7 F (37.1 C), temperature source Oral, resp. rate 19, height '5\' 5"'$  (1.651 m), weight 73.1 kg, SpO2 97 %.  Intake/Output Summary (Last 24 hours) at 03/14/2022 0850 Last data filed at 03/13/2022 2045 Gross per 24 hour  Intake 390 ml  Output --  Net 390 ml   Filed Weights   03/10/22 0936 03/11/22 0500  Weight: 72 kg 73.1 kg    Exam: Awake Alert, Oriented *3, No new F.N deficits, Normal affect [place].AT,PERRAL Supple Neck,No JVD, No cervical lymphadenopathy appriciated.  Symmetrical Chest wall movement, Good air movement bilaterally, CTAB RRR,No Gallops,Rubs or new Murmurs, No Parasternal Heave +ve B.Sounds, Abd Soft, Non tender, No organomegaly appriciated, No rebound -guarding or rigidity. No Cyanosis, Clubbing or edema, No new Rash or bruise   PERTINENT RADIOLOGIC STUDIES: ECHOCARDIOGRAM COMPLETE  Result Date: 03/12/2022    ECHOCARDIOGRAM REPORT   Patient Name:   YASIN DUCAT Bevel Date of Exam: 03/12/2022 Medical Rec #:  [id number]      Height:       65.0 in Accession #:     [id number]     Weight:       161.2 lb Date of Birth:  1935-07-04      BSA:          1.805 m Patient Age:    54 years       BP:           141/79 mmHg Patient Gender: M              HR:           76 bpm. Exam Location:  Inpatient Procedure: 2D Echo, Cardiac Doppler and Color Doppler Indications:    Elevated troponin  History:        Patient has prior history of Echocardiogram examinations, most                 recent 04/17/2020. COPD, Arrythmias:Atrial Fibrillation; Risk                 Factors:Hypertension.  Sonographer:    Jefferey Pica Referring Phys: [id number] [place]  1. Left ventricular ejection fraction, by estimation, is 60 to 65%. The left ventricle has normal function. The left ventricle has no regional wall motion abnormalities. Left ventricular diastolic parameters are consistent with Grade II diastolic dysfunction (pseudonormalization).  2. Right ventricular systolic function is normal. The right ventricular size is normal. There is mildly elevated pulmonary artery systolic pressure.  3. Left atrial size was mildly dilated.  4. The mitral valve is grossly normal. Trivial mitral valve regurgitation. No evidence of mitral stenosis.  5. The aortic valve is grossly normal. There is mild calcification of the aortic valve. There is mild thickening of the  aortic valve. Aortic valve regurgitation is trivial. Mild aortic valve stenosis. Conclusion(s)/Recommendation(s): Otherwise normal echocardiogram, with minor abnormalities described in the report. FINDINGS  Left Ventricle: Left ventricular ejection fraction, by estimation, is 60 to 65%. The left ventricle has normal function. The left ventricle has no regional wall motion abnormalities. The left ventricular internal cavity size was normal in size. There is  no left ventricular hypertrophy. Left ventricular diastolic parameters are consistent with Grade II diastolic dysfunction (pseudonormalization). Right Ventricle: The right ventricular size  is normal. No increase in right ventricular wall thickness. Right ventricular systolic function is normal. There is mildly elevated pulmonary artery systolic pressure. The tricuspid regurgitant velocity is 2.81  m/s, and with an assumed right atrial pressure of 8 mmHg, the estimated right ventricular systolic pressure is 27.0 mmHg. Left Atrium: Left atrial size was mildly dilated. Right Atrium: Right atrial size was normal in size. Pericardium: There is no evidence of pericardial effusion. Mitral Valve: The mitral valve is grossly normal. Mild mitral annular calcification. Trivial mitral valve regurgitation. No evidence of mitral valve stenosis. Tricuspid Valve: The tricuspid valve is normal in structure. Tricuspid valve regurgitation is trivial. No evidence of tricuspid stenosis. Aortic Valve: The aortic valve is grossly normal. There is mild calcification of the aortic valve. There is mild thickening of the aortic valve. Aortic valve regurgitation is trivial. Mild aortic stenosis is present. Aortic valve peak gradient measures 20.2 mmHg. Pulmonic Valve: The pulmonic valve was not well visualized. Pulmonic valve regurgitation is not visualized. Aorta: The aortic arch was not well visualized and the aortic root and ascending aorta are structurally normal, with no evidence of dilitation. Venous: The inferior vena cava was not well visualized. IAS/Shunts: The atrial septum is grossly normal.  LEFT VENTRICLE PLAX 2D LVIDd:         5.10 cm   Diastology LVIDs:         2.80 cm   LV e' medial:    6.80 cm/s LV PW:         0.90 cm   LV E/e' medial:  20.7 LV IVS:        1.30 cm   LV e' lateral:   12.30 cm/s LVOT diam:     2.00 cm   LV E/e' lateral: 11.5 LV SV:         76 LV SV Index:   42 LVOT Area:     3.14 cm  RIGHT VENTRICLE RV Basal diam:  3.20 cm RV S prime:     18.10 cm/s TAPSE (M-mode): 2.7 cm LEFT ATRIUM             Index        RIGHT ATRIUM           Index LA diam:        4.10 cm 2.27 cm/m   RA Area:     14.30 cm  LA Vol (A2C):   62.7 ml 34.74 ml/m  RA Volume:   33.40 ml  18.51 ml/m LA Vol (A4C):   44.0 ml 24.38 ml/m LA Biplane Vol: 55.0 ml 30.48 ml/m  AORTIC VALVE                 PULMONIC VALVE AV Area (Vmax): 1.54 cm     PV Vmax:       0.87 m/s AV Vmax:        225.00 cm/s  PV Peak grad:  3.0 mmHg AV Peak Grad:   20.2 mmHg LVOT Vmax:  110.00 cm/s LVOT Vmean:     63.000 cm/s LVOT VTI:       0.241 m  AORTA Ao Root diam: 3.30 cm Ao Asc diam:  3.40 cm MITRAL VALVE                TRICUSPID VALVE MV Area (PHT): 3.53 cm     TR Peak grad:   31.6 mmHg MV Decel Time: 215 msec     TR Vmax:        281.00 cm/s MV E velocity: 141.00 cm/s MV A velocity: 104.00 cm/s  SHUNTS MV E/A ratio:  1.36         Systemic VTI:  0.24 m                             Systemic Diam: 2.00 cm Buford Dresser MD Electronically signed by Buford Dresser MD Signature Date/Time: 03/12/2022/4:02:04 PM    Final      PERTINENT LAB RESULTS: CBC: Recent Labs    03/13/22 0210 03/14/22 0017  WBC 14.8* 17.8*  HGB 10.9* 11.2*  HCT 34.3* 34.2*  PLT 324 353   CMET CMP     Component Value Date/Time   NA 137 03/14/2022 0017   K 3.6 03/14/2022 0017   CL 101 03/14/2022 0017   CO2 27 03/14/2022 0017   GLUCOSE 137 (H) 03/14/2022 0017   BUN 55 (H) 03/14/2022 0017   CREATININE 1.60 (H) 03/14/2022 0017   CREATININE 1.20 09/05/2021 0820   CALCIUM 8.0 (L) 03/14/2022 0017   PROT 5.7 (L) 03/14/2022 0017   ALBUMIN 2.3 (L) 03/14/2022 0017   AST 19 03/14/2022 0017   ALT 21 03/14/2022 0017   ALKPHOS 82 03/14/2022 0017   BILITOT 0.6 03/14/2022 0017   GFRNONAA 42 (L) 03/14/2022 0017   GFRNONAA 48 (L) 03/30/2021 1113   GFRAA 55 (L) 03/30/2021 1113    GFR Estimated Creatinine Clearance: 29.9 mL/min (A) (by C-G formula based on SCr of 1.6 mg/dL (H)). No results for input(s): "LIPASE", "AMYLASE" in the last 72 hours. No results for input(s): "CKTOTAL", "CKMB", "CKMBINDEX", "TROPONINI" in the last 72 hours. Invalid input(s):  "POCBNP" No results for input(s): "DDIMER" in the last 72 hours. No results for input(s): "HGBA1C" in the last 72 hours. No results for input(s): "CHOL", "HDL", "LDLCALC", "TRIG", "CHOLHDL", "LDLDIRECT" in the last 72 hours. No results for input(s): "TSH", "T4TOTAL", "T3FREE", "THYROIDAB" in the last 72 hours.  Invalid input(s): "FREET3" No results for input(s): "VITAMINB12", "FOLATE", "FERRITIN", "TIBC", "IRON", "RETICCTPCT" in the last 72 hours. Coags: No results for input(s): "INR" in the last 72 hours.  Invalid input(s): "PT" Microbiology: Recent Results (from the past 240 hour(s))  Blood Culture (routine x 2)     Status: None (Preliminary result)   Collection Time: 03/10/22  9:24 AM   Specimen: BLOOD  Result Value Ref Range Status   Specimen Description BLOOD BLOOD LEFT FOREARM  Final   Special Requests   Final    BOTTLES DRAWN AEROBIC AND ANAEROBIC Blood Culture results may not be optimal due to an inadequate volume of blood received in culture bottles   Culture   Final    NO GROWTH 3 DAYS Performed at [facility], [place] [address] [place], [place] [address]    Report Status PENDING  Incomplete  SARS Coronavirus 2 by RT PCR (hospital order, performed in [facility] hospital lab) *cepheid single result test* Anterior Nasal Swab  Status: None   Collection Time: 03/10/22  9:29 AM   Specimen: Anterior Nasal Swab  Result Value Ref Range Status   SARS Coronavirus 2 by RT PCR NEGATIVE NEGATIVE Final    Comment: (NOTE) SARS-CoV-2 target nucleic acids are NOT DETECTED.  The SARS-CoV-2 RNA is generally detectable in upper and lower respiratory specimens during the acute phase of infection. The lowest concentration of SARS-CoV-2 viral copies this assay can detect is 250 copies / mL. A negative result does not preclude SARS-CoV-2 infection and should not be used as the sole basis for treatment or other patient management decisions.  A negative result may occur with improper  specimen collection / handling, submission of specimen other than nasopharyngeal swab, presence of viral mutation(s) within the areas targeted by this assay, and inadequate number of viral copies (<250 copies / mL). A negative result must be combined with clinical observations, patient history, and epidemiological information.  Fact Sheet for Patients:   https://www.patel.info/  Fact Sheet for Healthcare Providers: https://hall.com/  This test is not yet approved or  cleared by the [place] FDA and has been authorized for detection and/or diagnosis of SARS-CoV-2 by FDA under an Emergency Use Authorization (EUA).  This EUA will remain in effect (meaning this test can be used) for the duration of the COVID-19 declaration under Section 564(b)(1) of the Act, 21 U.S.C. section 360bbb-3(b)(1), unless the authorization is terminated or revoked sooner.  Performed at [facility], [place] [address] [place], [place] [address]   Blood Culture (routine x 2)     Status: None (Preliminary result)   Collection Time: 03/10/22  9:30 AM   Specimen: BLOOD  Result Value Ref Range Status   Specimen Description BLOOD BLOOD LEFT HAND  Final   Special Requests   Final    BOTTLES DRAWN AEROBIC AND ANAEROBIC Blood Culture results may not be optimal due to an inadequate volume of blood received in culture bottles   Culture   Final    NO GROWTH 3 DAYS Performed at [facility], [place] [address] [place], [place] [address]    Report Status PENDING  Incomplete  Urine Culture     Status: None   Collection Time: 03/10/22 10:00 AM   Specimen: In/Out Cath Urine  Result Value Ref Range Status   Specimen Description IN/OUT CATH URINE  Final   Special Requests NONE  Final   Culture   Final    NO GROWTH Performed at [facility], [place] [address] [place], [place] [address]    Report Status 03/11/2022 FINAL  Final  MRSA Next Gen by PCR, Nasal      Status: Abnormal   Collection Time: 03/11/22  8:23 AM   Specimen: Nasal Mucosa; Nasal Swab  Result Value Ref Range Status   MRSA by PCR Next Gen DETECTED (A) NOT DETECTED Final    Comment: RESULT CALLED TO, READ BACK BY AND VERIFIED WITH: Theodosia Quay RN, AT 1352 03/11/22 D. VANHOOK (NOTE) The GeneXpert MRSA Assay (FDA approved for NASAL specimens only), is one component of a comprehensive MRSA colonization surveillance program. It is not intended to diagnose MRSA infection nor to guide or monitor treatment for MRSA infections. Test performance is not FDA approved in patients less than 62 years old. Performed at [facility], [place] [address] [place], [place] [address]     FURTHER DISCHARGE INSTRUCTIONS:  Get Medicines reviewed and adjusted: Please take all your medications with you for your  next visit with your Primary MD  Laboratory/radiological data: Please request your Primary MD to go over all hospital tests and procedure/radiological results at the follow up, please ask your Primary MD to get all Hospital records sent to his/her office.  In some cases, they will be blood work, cultures and biopsy results pending at the time of your discharge. Please request that your primary care M.D. goes through all the records of your hospital data and follows up on these results.  Also Note the following: If you experience worsening of your admission symptoms, develop shortness of breath, life threatening emergency, suicidal or homicidal thoughts you must seek medical attention immediately by calling 911 or calling your MD immediately  if symptoms less severe.  You must read complete instructions/literature along with all the possible adverse reactions/side effects for all the Medicines you take and that have been prescribed to you. Take any new Medicines after you have completely understood and accpet all the possible adverse reactions/side effects.   Do not drive when taking Pain  medications or sleeping medications (Benzodaizepines)  Do not take more than prescribed Pain, Sleep and Anxiety Medications. It is not advisable to combine anxiety,sleep and pain medications without talking with your primary care practitioner  Special Instructions: If you have smoked or chewed Tobacco  in the last 2 yrs please stop smoking, stop any regular Alcohol  and or any Recreational drug use.  Wear Seat belts while driving.  Please note: You were cared for by a hospitalist during your hospital stay. Once you are discharged, your primary care physician will handle any further medical issues. Please note that NO REFILLS for any discharge medications will be authorized once you are discharged, as it is imperative that you return to your primary care physician (or establish a relationship with a primary care physician if you do not have one) for your post hospital discharge needs so that they can reassess your need for medications and monitor your lab values.  Total Time spent coordinating discharge including counseling, education and face to face time equals greater than 30 minutes.  SignedOren Binet 03/14/2022 8:50 AM

## 2022-03-14 NOTE — Care Management Important Message (Signed)
Important Message  Patient Details  Name: Edward [name] MRN: [id number] Date of Birth: Nov 15, 1934   Medicare Important Message Given:  Yes     Carissa Musick Montine Circle 03/14/2022, 9:44 AM

## 2022-03-15 ENCOUNTER — Telehealth: Payer: Self-pay

## 2022-03-15 DIAGNOSIS — N179 Acute kidney failure, unspecified: Secondary | ICD-10-CM | POA: Diagnosis not present

## 2022-03-15 DIAGNOSIS — J9621 Acute and chronic respiratory failure with hypoxia: Secondary | ICD-10-CM | POA: Diagnosis not present

## 2022-03-15 DIAGNOSIS — E1122 Type 2 diabetes mellitus with diabetic chronic kidney disease: Secondary | ICD-10-CM | POA: Diagnosis not present

## 2022-03-15 DIAGNOSIS — J441 Chronic obstructive pulmonary disease with (acute) exacerbation: Secondary | ICD-10-CM | POA: Diagnosis not present

## 2022-03-15 DIAGNOSIS — G9341 Metabolic encephalopathy: Secondary | ICD-10-CM | POA: Diagnosis not present

## 2022-03-15 DIAGNOSIS — I5022 Chronic systolic (congestive) heart failure: Secondary | ICD-10-CM | POA: Diagnosis not present

## 2022-03-15 DIAGNOSIS — J440 Chronic obstructive pulmonary disease with acute lower respiratory infection: Secondary | ICD-10-CM | POA: Diagnosis not present

## 2022-03-15 DIAGNOSIS — Z7984 Long term (current) use of oral hypoglycemic drugs: Secondary | ICD-10-CM | POA: Diagnosis not present

## 2022-03-15 DIAGNOSIS — E1165 Type 2 diabetes mellitus with hyperglycemia: Secondary | ICD-10-CM | POA: Diagnosis not present

## 2022-03-15 DIAGNOSIS — A419 Sepsis, unspecified organism: Secondary | ICD-10-CM | POA: Diagnosis not present

## 2022-03-15 DIAGNOSIS — Z9981 Dependence on supplemental oxygen: Secondary | ICD-10-CM | POA: Diagnosis not present

## 2022-03-15 DIAGNOSIS — J8410 Pulmonary fibrosis, unspecified: Secondary | ICD-10-CM | POA: Diagnosis not present

## 2022-03-15 DIAGNOSIS — Z794 Long term (current) use of insulin: Secondary | ICD-10-CM | POA: Diagnosis not present

## 2022-03-15 DIAGNOSIS — E559 Vitamin D deficiency, unspecified: Secondary | ICD-10-CM | POA: Diagnosis not present

## 2022-03-15 DIAGNOSIS — D500 Iron deficiency anemia secondary to blood loss (chronic): Secondary | ICD-10-CM | POA: Diagnosis not present

## 2022-03-15 DIAGNOSIS — E7800 Pure hypercholesterolemia, unspecified: Secondary | ICD-10-CM | POA: Diagnosis not present

## 2022-03-15 DIAGNOSIS — R6521 Severe sepsis with septic shock: Secondary | ICD-10-CM | POA: Diagnosis not present

## 2022-03-15 DIAGNOSIS — Z87891 Personal history of nicotine dependence: Secondary | ICD-10-CM | POA: Diagnosis not present

## 2022-03-15 DIAGNOSIS — Z7901 Long term (current) use of anticoagulants: Secondary | ICD-10-CM | POA: Diagnosis not present

## 2022-03-15 DIAGNOSIS — R627 Adult failure to thrive: Secondary | ICD-10-CM | POA: Diagnosis not present

## 2022-03-15 DIAGNOSIS — N1832 Chronic kidney disease, stage 3b: Secondary | ICD-10-CM | POA: Diagnosis not present

## 2022-03-15 DIAGNOSIS — I130 Hypertensive heart and chronic kidney disease with heart failure and stage 1 through stage 4 chronic kidney disease, or unspecified chronic kidney disease: Secondary | ICD-10-CM | POA: Diagnosis not present

## 2022-03-15 DIAGNOSIS — Z9181 History of falling: Secondary | ICD-10-CM | POA: Diagnosis not present

## 2022-03-15 LAB — CULTURE, BLOOD (ROUTINE X 2)
Culture: NO GROWTH
Culture: NO GROWTH

## 2022-03-15 NOTE — Telephone Encounter (Signed)
Transition Care Management Follow-up Telephone Call Date of discharge and from where: 03/14/2022 [place] How have you been since you were released from the hospital? Spoke with pt's son, [name] and he states pt is doing better. Any questions or concerns? No  Items Reviewed: Did the pt receive and understand the discharge instructions provided? Yes  Medications obtained and verified? Yes  Other? No  Any new allergies since your discharge? No  Dietary orders reviewed? Yes Do you have support at home? Yes   Home Care and Equipment/Supplies: Were home health services ordered? yes If so, what is the name of the agency? [facility]  Has the agency set up a time to come to the patient's home? yes Were any new equipment or medical supplies ordered?  No What is the name of the medical supply agency? N/A Were you able to get the supplies/equipment? not applicable Do you have any questions related to the use of the equipment or supplies? No  Functional Questionnaire: (I = Independent and D = Dependent) ADLs: I  Bathing/Dressing- I  Meal Prep- I  Eating- I  Maintaining continence- I  Transferring/Ambulation- I  Managing Meds- I  Follow up appointments reviewed:  PCP Hospital f/u appt confirmed? Yes  Scheduled to see Dr. Dennard Schaumann on 03/14/2022 @ 11:15. [facility] f/u appt confirmed? No    Are transportation arrangements needed? No  If their condition worsens, is the pt aware to call PCP or go to the Emergency Dept.? Yes Was the patient provided with contact information for the PCP's office or ED? Yes Was to pt encouraged to call back with questions or concerns? Yes

## 2022-03-17 DIAGNOSIS — Z87891 Personal history of nicotine dependence: Secondary | ICD-10-CM | POA: Diagnosis not present

## 2022-03-17 DIAGNOSIS — J9621 Acute and chronic respiratory failure with hypoxia: Secondary | ICD-10-CM | POA: Diagnosis not present

## 2022-03-17 DIAGNOSIS — Z9181 History of falling: Secondary | ICD-10-CM | POA: Diagnosis not present

## 2022-03-17 DIAGNOSIS — J441 Chronic obstructive pulmonary disease with (acute) exacerbation: Secondary | ICD-10-CM | POA: Diagnosis not present

## 2022-03-17 DIAGNOSIS — D500 Iron deficiency anemia secondary to blood loss (chronic): Secondary | ICD-10-CM | POA: Diagnosis not present

## 2022-03-17 DIAGNOSIS — Z794 Long term (current) use of insulin: Secondary | ICD-10-CM | POA: Diagnosis not present

## 2022-03-17 DIAGNOSIS — E1165 Type 2 diabetes mellitus with hyperglycemia: Secondary | ICD-10-CM | POA: Diagnosis not present

## 2022-03-17 DIAGNOSIS — J440 Chronic obstructive pulmonary disease with acute lower respiratory infection: Secondary | ICD-10-CM | POA: Diagnosis not present

## 2022-03-17 DIAGNOSIS — N1832 Chronic kidney disease, stage 3b: Secondary | ICD-10-CM | POA: Diagnosis not present

## 2022-03-17 DIAGNOSIS — I5022 Chronic systolic (congestive) heart failure: Secondary | ICD-10-CM | POA: Diagnosis not present

## 2022-03-17 DIAGNOSIS — N179 Acute kidney failure, unspecified: Secondary | ICD-10-CM | POA: Diagnosis not present

## 2022-03-17 DIAGNOSIS — E1122 Type 2 diabetes mellitus with diabetic chronic kidney disease: Secondary | ICD-10-CM | POA: Diagnosis not present

## 2022-03-17 DIAGNOSIS — R6521 Severe sepsis with septic shock: Secondary | ICD-10-CM | POA: Diagnosis not present

## 2022-03-17 DIAGNOSIS — Z9981 Dependence on supplemental oxygen: Secondary | ICD-10-CM | POA: Diagnosis not present

## 2022-03-17 DIAGNOSIS — Z7901 Long term (current) use of anticoagulants: Secondary | ICD-10-CM | POA: Diagnosis not present

## 2022-03-17 DIAGNOSIS — E559 Vitamin D deficiency, unspecified: Secondary | ICD-10-CM | POA: Diagnosis not present

## 2022-03-17 DIAGNOSIS — A419 Sepsis, unspecified organism: Secondary | ICD-10-CM | POA: Diagnosis not present

## 2022-03-17 DIAGNOSIS — Z7984 Long term (current) use of oral hypoglycemic drugs: Secondary | ICD-10-CM | POA: Diagnosis not present

## 2022-03-17 DIAGNOSIS — E7800 Pure hypercholesterolemia, unspecified: Secondary | ICD-10-CM | POA: Diagnosis not present

## 2022-03-17 DIAGNOSIS — I130 Hypertensive heart and chronic kidney disease with heart failure and stage 1 through stage 4 chronic kidney disease, or unspecified chronic kidney disease: Secondary | ICD-10-CM | POA: Diagnosis not present

## 2022-03-17 DIAGNOSIS — G9341 Metabolic encephalopathy: Secondary | ICD-10-CM | POA: Diagnosis not present

## 2022-03-17 DIAGNOSIS — R627 Adult failure to thrive: Secondary | ICD-10-CM | POA: Diagnosis not present

## 2022-03-17 DIAGNOSIS — J8410 Pulmonary fibrosis, unspecified: Secondary | ICD-10-CM | POA: Diagnosis not present

## 2022-03-20 ENCOUNTER — Ambulatory Visit: Payer: Self-pay | Admitting: *Deleted

## 2022-03-20 ENCOUNTER — Other Ambulatory Visit: Payer: Self-pay

## 2022-03-20 ENCOUNTER — Emergency Department (HOSPITAL_COMMUNITY)
Admission: EM | Admit: 2022-03-20 | Discharge: 2022-03-20 | Disposition: A | Discharge status: Home / Self Care | Payer: Medicare Other | Attending: Emergency Medicine | Admitting: Emergency Medicine

## 2022-03-20 ENCOUNTER — Emergency Department (HOSPITAL_COMMUNITY): Payer: Medicare Other

## 2022-03-20 ENCOUNTER — Encounter (HOSPITAL_COMMUNITY): Payer: Self-pay | Admitting: *Deleted

## 2022-03-20 DIAGNOSIS — N1832 Chronic kidney disease, stage 3b: Secondary | ICD-10-CM | POA: Diagnosis not present

## 2022-03-20 DIAGNOSIS — J440 Chronic obstructive pulmonary disease with acute lower respiratory infection: Secondary | ICD-10-CM | POA: Diagnosis not present

## 2022-03-20 DIAGNOSIS — R739 Hyperglycemia, unspecified: Secondary | ICD-10-CM

## 2022-03-20 DIAGNOSIS — Z87891 Personal history of nicotine dependence: Secondary | ICD-10-CM | POA: Diagnosis not present

## 2022-03-20 DIAGNOSIS — E1165 Type 2 diabetes mellitus with hyperglycemia: Secondary | ICD-10-CM | POA: Insufficient documentation

## 2022-03-20 DIAGNOSIS — J9621 Acute and chronic respiratory failure with hypoxia: Secondary | ICD-10-CM | POA: Diagnosis not present

## 2022-03-20 DIAGNOSIS — J441 Chronic obstructive pulmonary disease with (acute) exacerbation: Secondary | ICD-10-CM | POA: Insufficient documentation

## 2022-03-20 DIAGNOSIS — Z7984 Long term (current) use of oral hypoglycemic drugs: Secondary | ICD-10-CM | POA: Diagnosis not present

## 2022-03-20 DIAGNOSIS — Z9181 History of falling: Secondary | ICD-10-CM | POA: Diagnosis not present

## 2022-03-20 DIAGNOSIS — Z7951 Long term (current) use of inhaled steroids: Secondary | ICD-10-CM | POA: Insufficient documentation

## 2022-03-20 DIAGNOSIS — D500 Iron deficiency anemia secondary to blood loss (chronic): Secondary | ICD-10-CM | POA: Diagnosis not present

## 2022-03-20 DIAGNOSIS — E559 Vitamin D deficiency, unspecified: Secondary | ICD-10-CM | POA: Diagnosis not present

## 2022-03-20 DIAGNOSIS — R059 Cough, unspecified: Secondary | ICD-10-CM | POA: Diagnosis not present

## 2022-03-20 DIAGNOSIS — I5022 Chronic systolic (congestive) heart failure: Secondary | ICD-10-CM | POA: Diagnosis not present

## 2022-03-20 DIAGNOSIS — G9341 Metabolic encephalopathy: Secondary | ICD-10-CM | POA: Diagnosis not present

## 2022-03-20 DIAGNOSIS — Z7901 Long term (current) use of anticoagulants: Secondary | ICD-10-CM | POA: Diagnosis not present

## 2022-03-20 DIAGNOSIS — I4891 Unspecified atrial fibrillation: Secondary | ICD-10-CM | POA: Insufficient documentation

## 2022-03-20 DIAGNOSIS — E7800 Pure hypercholesterolemia, unspecified: Secondary | ICD-10-CM | POA: Diagnosis not present

## 2022-03-20 DIAGNOSIS — R627 Adult failure to thrive: Secondary | ICD-10-CM | POA: Diagnosis not present

## 2022-03-20 DIAGNOSIS — R6521 Severe sepsis with septic shock: Secondary | ICD-10-CM | POA: Diagnosis not present

## 2022-03-20 DIAGNOSIS — Z794 Long term (current) use of insulin: Secondary | ICD-10-CM | POA: Diagnosis not present

## 2022-03-20 DIAGNOSIS — I130 Hypertensive heart and chronic kidney disease with heart failure and stage 1 through stage 4 chronic kidney disease, or unspecified chronic kidney disease: Secondary | ICD-10-CM | POA: Diagnosis not present

## 2022-03-20 DIAGNOSIS — J8410 Pulmonary fibrosis, unspecified: Secondary | ICD-10-CM | POA: Diagnosis not present

## 2022-03-20 DIAGNOSIS — I509 Heart failure, unspecified: Secondary | ICD-10-CM | POA: Diagnosis not present

## 2022-03-20 DIAGNOSIS — E1122 Type 2 diabetes mellitus with diabetic chronic kidney disease: Secondary | ICD-10-CM | POA: Diagnosis not present

## 2022-03-20 DIAGNOSIS — R0602 Shortness of breath: Secondary | ICD-10-CM | POA: Insufficient documentation

## 2022-03-20 DIAGNOSIS — N179 Acute kidney failure, unspecified: Secondary | ICD-10-CM | POA: Diagnosis not present

## 2022-03-20 DIAGNOSIS — Z9981 Dependence on supplemental oxygen: Secondary | ICD-10-CM | POA: Diagnosis not present

## 2022-03-20 DIAGNOSIS — A419 Sepsis, unspecified organism: Secondary | ICD-10-CM | POA: Diagnosis not present

## 2022-03-20 LAB — COMPREHENSIVE METABOLIC PANEL
ALT: 17 U/L (ref 0–44)
AST: 13 U/L — ABNORMAL LOW (ref 15–41)
Albumin: 2.5 g/dL — ABNORMAL LOW (ref 3.5–5.0)
Alkaline Phosphatase: 103 U/L (ref 38–126)
Anion gap: 8 (ref 5–15)
BUN: 45 mg/dL — ABNORMAL HIGH (ref 8–23)
CO2: 26 mmol/L (ref 22–32)
Calcium: 8.2 mg/dL — ABNORMAL LOW (ref 8.9–10.3)
Chloride: 96 mmol/L — ABNORMAL LOW (ref 98–111)
Creatinine, Ser: 1.37 mg/dL — ABNORMAL HIGH (ref 0.61–1.24)
GFR, Estimated: 51 mL/min — ABNORMAL LOW (ref >60)
Glucose, Bld: 422 mg/dL — ABNORMAL HIGH (ref 70–99)
Potassium: 4.6 mmol/L (ref 3.5–5.1)
Sodium: 130 mmol/L — ABNORMAL LOW (ref 135–145)
Total Bilirubin: 0.2 mg/dL — ABNORMAL LOW (ref 0.3–1.2)
Total Protein: 6.5 g/dL (ref 6.5–8.1)

## 2022-03-20 LAB — BLOOD GAS, VENOUS
Acid-Base Excess: 3.5 mmol/L — ABNORMAL HIGH (ref 0.0–2.0)
Bicarbonate: 29.6 mmol/L — ABNORMAL HIGH (ref 20.0–28.0)
Drawn by: 61882
O2 Saturation: 71.4 %
Patient temperature: 36.8
pCO2, Ven: 50 mmHg (ref 44–60)
pH, Ven: 7.38 (ref 7.25–7.43)
pO2, Ven: 36 mmHg (ref 32–45)

## 2022-03-20 LAB — TROPONIN I (HIGH SENSITIVITY)
Troponin I (High Sensitivity): 15 ng/L (ref <18)
Troponin I (High Sensitivity): 16 ng/L (ref <18)

## 2022-03-20 LAB — CBC WITH DIFFERENTIAL/PLATELET
Abs Immature Granulocytes: 0.27 10*3/uL — ABNORMAL HIGH (ref 0.00–0.07)
Basophils Absolute: 0 10*3/uL (ref 0.0–0.1)
Basophils Relative: 0 %
Eosinophils Absolute: 0.1 10*3/uL (ref 0.0–0.5)
Eosinophils Relative: 1 %
HCT: 36.8 % — ABNORMAL LOW (ref 39.0–52.0)
Hemoglobin: 11.9 g/dL — ABNORMAL LOW (ref 13.0–17.0)
Immature Granulocytes: 3 %
Lymphocytes Relative: 17 %
Lymphs Abs: 1.8 10*3/uL (ref 0.7–4.0)
MCH: 26.9 pg (ref 26.0–34.0)
MCHC: 32.3 g/dL (ref 30.0–36.0)
MCV: 83.3 fL (ref 80.0–100.0)
Monocytes Absolute: 1 10*3/uL (ref 0.1–1.0)
Monocytes Relative: 9 %
Neutro Abs: 7.8 10*3/uL — ABNORMAL HIGH (ref 1.7–7.7)
Neutrophils Relative %: 70 %
Platelets: 457 10*3/uL — ABNORMAL HIGH (ref 150–400)
RBC: 4.42 MIL/uL (ref 4.22–5.81)
RDW: 14.1 % (ref 11.5–15.5)
WBC: 11 10*3/uL — ABNORMAL HIGH (ref 4.0–10.5)
nRBC: 0 % (ref 0.0–0.2)

## 2022-03-20 LAB — CBG MONITORING, ED
Glucose-Capillary: 462 mg/dL — ABNORMAL HIGH (ref 70–99)
Glucose-Capillary: 318 mg/dL — ABNORMAL HIGH (ref 70–99)

## 2022-03-20 MED ORDER — INSULIN ASPART 100 UNIT/ML IJ SOLN
7.0000 [IU] | Freq: Once | INTRAMUSCULAR | Status: AC
Start: 2022-03-20 — End: 2022-03-20
  Administered 2022-03-20: 7 [IU] via SUBCUTANEOUS

## 2022-03-20 MED ORDER — IPRATROPIUM-ALBUTEROL 0.5-2.5 (3) MG/3ML IN SOLN
3.0000 mL | Freq: Once | RESPIRATORY_TRACT | Status: AC
  Administered 2022-03-20: 3 mL via RESPIRATORY_TRACT
  Filled 2022-03-20: qty 3

## 2022-03-20 MED ORDER — LACTATED RINGERS IV BOLUS
1000.0000 mL | Freq: Once | INTRAVENOUS | Status: AC
  Administered 2022-03-20: 1000 mL via INTRAVENOUS

## 2022-03-20 MED ORDER — INSULIN ASPART 100 UNIT/ML IJ SOLN: 8.0000 [IU] | Freq: Once | INTRAMUSCULAR | Status: DC

## 2022-03-20 NOTE — Telephone Encounter (Signed)
  Chief Complaint: elevated glucose-+500 Symptoms: glucose 511, wheezing 4 lobes on 3 liters O2 Frequency: levels reported today- high, wheezing reported Friday- ongoing Pertinent Negatives: Patient denies   Disposition: '[x]'$ ED /'[]'$ Urgent Care (no appt availability in office) / '[]'$ Appointment(In office/virtual)/ '[]'$  [facility]/ '[]'$ Home Care/ '[]'$ Refused Recommended Disposition /'[]'$  Mobile Bus/ '[]'$  Follow-up with PCP Additional Notes:     Reason for Disposition  Blood glucose > 500 mg/dL (27.8 mmol/L)  Answer Assessment - Initial Assessment Questions 1. BLOOD GLUCOSE: "What is your blood glucose level?"      511 2. ONSET: "When did you check the blood glucose?"     today 3. USUAL RANGE: "What is your glucose level usually?" (e.g., usual fasting morning value, usual evening value)     307- fasting today- 511 after breakfast 4. KETONES: "Do you check for ketones (urine or blood test strips)?" If yes, ask: "What does the test show now?"        5. TYPE 1 or 2:  "Do you know what type of diabetes you have?"  (e.g., Type 1, Type 2, Gestational; doesn't know)      Type 2 6. INSULIN: "Do you take insulin?" "What type of insulin(s) do you use? What is the mode of delivery? (syringe, pen; injection or pump)?"      no 7. DIABETES PILLS: "Do you take any pills for your diabetes?" If yes, ask: "Have you missed taking any pills recently?"     Actos 8. OTHER SYMPTOMS: "Do you have any symptoms?" (e.g., fever, frequent urination, difficulty breathing, dizziness, weakness, vomiting)     Wheezing all 4 lobes 9. PREGNANCY: "Is there any chance you are pregnant?" "When was your last menstrual period?"  Protocols used: Diabetes - High Blood Sugar-A-AH

## 2022-03-20 NOTE — ED Provider Triage Note (Signed)
Emergency Medicine Provider Triage Evaluation Note  Edward [name] , a 87 y.o. male  was evaluated in triage.  Pt complains of shortness of breath and hyperglycemia.  Patient was just recently admitted to the hospital on 03/10/2022 with acute respiratory failure with hypoxia.  Patient states that his breathing was significantly better upon discharge but has been progressively worse since his discharge. Has a history of COPD and is on 3 L at home.  History of insulin-dependent diabetic, hypertension and hyperlipidemia.  Patient had a high glucose reading earlier today which prompted his visit to the emergency department along with his increasingly shortness of breath.  Denies abdominal pain, nausea/vomiting/diarrhea, fever, chills, night sweats.  Review of Systems  Positive: See above Negative:   Physical Exam  BP 136/80 (BP Location: Right Arm)   Pulse 88   Temp 98.2 F (36.8 C) (Temporal)   Resp (!) 21   Ht '5\' 5"'$  (1.651 m)   Wt 73.1 kg   SpO2 99%   BMI 26.82 kg/m  Gen:   Awake, no distress   Resp:  Normal effort  MSK:   Moves extremities without difficulty  Other:  Diffuse wheezing noted on exam.  No lower extremity edema.  Medical Decision Making  Medically screening exam initiated at 4:42 PM.  Appropriate orders placed.  Camran ISAAH FURRY was informed that the remainder of the evaluation will be completed by another provider, this initial triage assessment does not replace that evaluation, and the importance of remaining in the ED until their evaluation is complete.     Wilnette Kales, [place] 03/20/22 1656

## 2022-03-20 NOTE — ED Provider Notes (Incomplete)
[facility] EMERGENCY DEPARTMENT Provider Note   CSN: [id number] Arrival date & time: 03/20/22  1042     History {Add pertinent medical, surgical, social history, OB history to HPI:1} Chief Complaint  Patient presents with   Hyperglycemia    Edward [name] is a 87 y.o. male.   Hyperglycemia Associated symptoms: shortness of breath (Baseline)   Associated symptoms: no abdominal pain, no chest pain and no nausea   Patient presents for concern of hyperglycemia.  Medical history includes COPD, HTN, pulmonary fibrosis, HLD, DM2, atrial fibrillation, CHF, CKD, and anemia.  He was recently hospitalized for a COPD exacerbation.  He was treated for a left upper lobe pneumonia.  He was discharged 6 days ago.  He was discharged on a steroid taper and continued oral antibiotics.  During his admission, he was noted to have an elevated A1c.  Patient previously took 45 units of Lantus at home.  At time of discharge, his son was advised to give 15 units at night and to incrementally increase insulin as needed.  Patient's son was giving 15 units at night.  He had 20 units last night.  Sugars were noted to be elevated today.  Patient has not complained of any symptoms.  Per son, patient appears to be in his normal state of health.     Home Medications Prior to Admission medications   Medication Sig Start Date End Date Taking? Authorizing Provider  acetaminophen (TYLENOL) 500 MG tablet Take 500 mg by mouth every 6 (six) hours as needed for headache. Patient not taking: Reported on 03/11/2022    [provider]  albuterol (PROVENTIL) (2.5 MG/3ML) 0.083% nebulizer solution INHALE 3ML BY NEBULIZATION ROUTE EVERY 6 HOURS AS NEEDED FOR WHEEZING OR SHORTNESS OF BREATH Patient taking differently: Take 2.5 mg by nebulization every 6 (six) hours as needed for shortness of breath. 12/09/21   Susy Frizzle, MD  apixaban (ELIQUIS) 2.5 MG TABS tablet Take 1 tablet (2.5 mg total) by mouth 2 (two) times  daily. 03/14/22   Ghimire, Henreitta Leber, MD  fluticasone-salmeterol (WIXELA INHUB) 500-50 MCG/ACT AEPB Inhale 1 puff into the lungs in the morning and at bedtime. This replaces symbicort, 02/10/22   Susy Frizzle, MD  furosemide (LASIX) 40 MG tablet Take 1 tablet (40 mg total) by mouth daily. 03/10/22   Susy Frizzle, MD  glucose blood (ONETOUCH ULTRA) test strip CHECK FASTING BLOOD SUGAR TWICE DAILY 03/10/22   Susy Frizzle, MD  insulin glargine (LANTUS SOLOSTAR) 100 UNIT/ML Solostar Pen Inject 15 Units into the skin at bedtime. 03/14/22   Ghimire, Henreitta Leber, MD  Lancets (ONETOUCH DELICA PLUS [id number]) MISC USE TO CHECK BLOOD SUGAR TWICE DAILY AS DIRECTED 12/08/20   Susy Frizzle, MD  metoprolol tartrate (LOPRESSOR) 25 MG tablet TAKE 1 TABLET BY MOUTH TWICE DAILY Patient taking differently: Take 25 mg by mouth 2 (two) times daily. 10/18/21   Susy Frizzle, MD  OXYGEN Inhale 3 L into the lungs continuous.     [provider]  pioglitazone (ACTOS) 30 MG tablet Take 1 tablet (30 mg total) by mouth daily. 03/14/22   Ghimire, Henreitta Leber, MD  potassium chloride SA (KLOR-CON) 20 MEQ tablet Take 1 tablet by mouth once daily 03/01/21   Susy Frizzle, MD  pravastatin (PRAVACHOL) 80 MG tablet TAKE 1 TABLET BY MOUTH AT BEDTIME Patient taking differently: Take 80 mg by mouth at bedtime. 01/04/22   Susy Frizzle, MD  predniSONE (DELTASONE) 10 MG tablet  Take 40 mg( 4 tablets)  daily for 1 day, 30 mg (3 tablets)  daily for 1 day, 20 mg ( 2 tablets) daily for 1 days,10 mg ( 1 tablet)  daily for 1 day, then stop 03/14/22   Ghimire, Henreitta Leber, MD  pregabalin (LYRICA) 100 MG capsule Take 1 capsule (100 mg total) by mouth 2 (two) times daily. Patient taking differently: Take 100 mg by mouth daily as needed (pain). 12/16/21   Susy Frizzle, MD  tiotropium (SPIRIVA HANDIHALER) 18 MCG inhalation capsule Place 1 capsule (18 mcg total) into inhaler and inhale daily. 03/14/22 03/14/23  Jonetta Osgood,  MD      Allergies    Ace inhibitors    Review of Systems   Review of Systems  Respiratory:  Positive for shortness of breath (Baseline).   Cardiovascular:  Negative for chest pain.  Gastrointestinal:  Negative for abdominal pain and nausea.  Genitourinary:  Negative for flank pain.  All other systems reviewed and are negative.   Physical Exam Updated Vital Signs BP 136/80 (BP Location: Right Arm)   Pulse 88   Temp 98.2 F (36.8 C) (Temporal)   Resp (!) 21   Ht '5\' 5"'$  (1.651 m)   Wt 73.1 kg   SpO2 99%   BMI 26.82 kg/m  Physical Exam Vitals and nursing note reviewed.  Constitutional:      General: He is not in acute distress.    Appearance: Normal appearance. He is well-developed and normal weight. He is ill-appearing (Chronically). He is not toxic-appearing or diaphoretic.  HENT:     Head: Normocephalic and atraumatic.     Right Ear: External ear normal.     Left Ear: External ear normal.     Nose: Nose normal.  Eyes:     Extraocular Movements: Extraocular movements intact.     Conjunctiva/sclera: Conjunctivae normal.  Cardiovascular:     Rate and Rhythm: Normal rate and regular rhythm.     Heart sounds: No murmur heard. Pulmonary:     Effort: Pulmonary effort is normal. No respiratory distress.     Breath sounds: Normal breath sounds. No wheezing or rales.  Chest:     Chest wall: No tenderness.  Abdominal:     Palpations: Abdomen is soft.     Tenderness: There is no abdominal tenderness.  Musculoskeletal:        General: No swelling. Normal range of motion.     Cervical back: Neck supple.  Skin:    General: Skin is warm and dry.  Neurological:     General: No focal deficit present.     Mental Status: He is alert. Mental status is at baseline.  Psychiatric:        Mood and Affect: Mood normal.        Behavior: Behavior normal.        Thought Content: Thought content normal.        Judgment: Judgment normal.     ED Results / Procedures / Treatments    Labs (all labs ordered are listed, but only abnormal results are displayed) Labs Reviewed  CBG MONITORING, ED - Abnormal; Notable for the following components:      Result Value   Glucose-Capillary 462 (*)    All other components within normal limits  COMPREHENSIVE METABOLIC PANEL  CBC WITH DIFFERENTIAL/PLATELET  BLOOD GAS, VENOUS  TROPONIN I (HIGH SENSITIVITY)    EKG EKG Interpretation  Date/Time:  Monday March 20 2022 11:10:09 EDT Ventricular Rate:  85 PR Interval:  142 QRS Duration: 72 QT Interval:  350 QTC Calculation: 416 R Axis:   60 Text Interpretation: Normal sinus rhythm Nonspecific ST abnormality Confirmed by Lajean Saver [phone]) on 03/20/2022 11:28:07 AM  Radiology No results found.  Procedures Procedures  {Document cardiac monitor, telemetry assessment procedure when appropriate:1}  Medications Ordered in ED Medications  lactated ringers bolus 1,000 mL (has no administration in time range)  ipratropium-albuterol (DUONEB) 0.5-2.5 (3) MG/3ML nebulizer solution 3 mL (3 mLs Nebulization Given 03/20/22 1703)    ED Course/ Medical Decision Making/ A&P                           Medical Decision Making Amount and/or Complexity of Data Reviewed Labs: ordered.   ***  {Document critical care time when appropriate:1} {Document review of labs and clinical decision tools ie heart score, Chads2Vasc2 etc:1}  {Document your independent review of radiology images, and any outside records:1} {Document your discussion with family members, caretakers, and with consultants:1} {Document social determinants of health affecting pt's care:1} {Document your decision making why or why not admission, treatments were needed:1} Final Clinical Impression(s) / ED Diagnoses Final diagnoses:  None    Rx / DC Orders ED Discharge Orders     None

## 2022-03-20 NOTE — ED Triage Notes (Signed)
Pt states his cbg readings at home have been in 500's; pt denies any other complaints at this time

## 2022-03-20 NOTE — Discharge Instructions (Signed)
Continue to increase nighttime insulin dosing.  Take 30 units tonight and check sugars tomorrow.  If sugars are still elevated, increase further, to the 45 units you are taking before.  Return to the emergency department at any time for any new or worsening symptoms.

## 2022-03-21 DIAGNOSIS — E1165 Type 2 diabetes mellitus with hyperglycemia: Secondary | ICD-10-CM | POA: Diagnosis not present

## 2022-03-21 DIAGNOSIS — E1122 Type 2 diabetes mellitus with diabetic chronic kidney disease: Secondary | ICD-10-CM | POA: Diagnosis not present

## 2022-03-21 DIAGNOSIS — I130 Hypertensive heart and chronic kidney disease with heart failure and stage 1 through stage 4 chronic kidney disease, or unspecified chronic kidney disease: Secondary | ICD-10-CM | POA: Diagnosis not present

## 2022-03-21 DIAGNOSIS — E559 Vitamin D deficiency, unspecified: Secondary | ICD-10-CM | POA: Diagnosis not present

## 2022-03-21 DIAGNOSIS — J440 Chronic obstructive pulmonary disease with acute lower respiratory infection: Secondary | ICD-10-CM | POA: Diagnosis not present

## 2022-03-21 DIAGNOSIS — J441 Chronic obstructive pulmonary disease with (acute) exacerbation: Secondary | ICD-10-CM | POA: Diagnosis not present

## 2022-03-21 DIAGNOSIS — N1832 Chronic kidney disease, stage 3b: Secondary | ICD-10-CM | POA: Diagnosis not present

## 2022-03-21 DIAGNOSIS — J9621 Acute and chronic respiratory failure with hypoxia: Secondary | ICD-10-CM | POA: Diagnosis not present

## 2022-03-21 DIAGNOSIS — Z87891 Personal history of nicotine dependence: Secondary | ICD-10-CM | POA: Diagnosis not present

## 2022-03-21 DIAGNOSIS — G9341 Metabolic encephalopathy: Secondary | ICD-10-CM | POA: Diagnosis not present

## 2022-03-21 DIAGNOSIS — Z7984 Long term (current) use of oral hypoglycemic drugs: Secondary | ICD-10-CM | POA: Diagnosis not present

## 2022-03-21 DIAGNOSIS — I5022 Chronic systolic (congestive) heart failure: Secondary | ICD-10-CM | POA: Diagnosis not present

## 2022-03-21 DIAGNOSIS — Z9181 History of falling: Secondary | ICD-10-CM | POA: Diagnosis not present

## 2022-03-21 DIAGNOSIS — J8410 Pulmonary fibrosis, unspecified: Secondary | ICD-10-CM | POA: Diagnosis not present

## 2022-03-21 DIAGNOSIS — N179 Acute kidney failure, unspecified: Secondary | ICD-10-CM | POA: Diagnosis not present

## 2022-03-21 DIAGNOSIS — R6521 Severe sepsis with septic shock: Secondary | ICD-10-CM | POA: Diagnosis not present

## 2022-03-21 DIAGNOSIS — R627 Adult failure to thrive: Secondary | ICD-10-CM | POA: Diagnosis not present

## 2022-03-21 DIAGNOSIS — D500 Iron deficiency anemia secondary to blood loss (chronic): Secondary | ICD-10-CM | POA: Diagnosis not present

## 2022-03-21 DIAGNOSIS — Z794 Long term (current) use of insulin: Secondary | ICD-10-CM | POA: Diagnosis not present

## 2022-03-21 DIAGNOSIS — E7800 Pure hypercholesterolemia, unspecified: Secondary | ICD-10-CM | POA: Diagnosis not present

## 2022-03-21 DIAGNOSIS — Z9981 Dependence on supplemental oxygen: Secondary | ICD-10-CM | POA: Diagnosis not present

## 2022-03-21 DIAGNOSIS — Z7901 Long term (current) use of anticoagulants: Secondary | ICD-10-CM | POA: Diagnosis not present

## 2022-03-21 DIAGNOSIS — A419 Sepsis, unspecified organism: Secondary | ICD-10-CM | POA: Diagnosis not present

## 2022-03-22 DIAGNOSIS — N1832 Chronic kidney disease, stage 3b: Secondary | ICD-10-CM | POA: Diagnosis not present

## 2022-03-22 DIAGNOSIS — Z7984 Long term (current) use of oral hypoglycemic drugs: Secondary | ICD-10-CM | POA: Diagnosis not present

## 2022-03-22 DIAGNOSIS — R627 Adult failure to thrive: Secondary | ICD-10-CM | POA: Diagnosis not present

## 2022-03-22 DIAGNOSIS — E1122 Type 2 diabetes mellitus with diabetic chronic kidney disease: Secondary | ICD-10-CM | POA: Diagnosis not present

## 2022-03-22 DIAGNOSIS — E559 Vitamin D deficiency, unspecified: Secondary | ICD-10-CM | POA: Diagnosis not present

## 2022-03-22 DIAGNOSIS — Z7901 Long term (current) use of anticoagulants: Secondary | ICD-10-CM | POA: Diagnosis not present

## 2022-03-22 DIAGNOSIS — I5022 Chronic systolic (congestive) heart failure: Secondary | ICD-10-CM | POA: Diagnosis not present

## 2022-03-22 DIAGNOSIS — D500 Iron deficiency anemia secondary to blood loss (chronic): Secondary | ICD-10-CM | POA: Diagnosis not present

## 2022-03-22 DIAGNOSIS — J440 Chronic obstructive pulmonary disease with acute lower respiratory infection: Secondary | ICD-10-CM | POA: Diagnosis not present

## 2022-03-22 DIAGNOSIS — J441 Chronic obstructive pulmonary disease with (acute) exacerbation: Secondary | ICD-10-CM | POA: Diagnosis not present

## 2022-03-22 DIAGNOSIS — I130 Hypertensive heart and chronic kidney disease with heart failure and stage 1 through stage 4 chronic kidney disease, or unspecified chronic kidney disease: Secondary | ICD-10-CM | POA: Diagnosis not present

## 2022-03-22 DIAGNOSIS — R6521 Severe sepsis with septic shock: Secondary | ICD-10-CM | POA: Diagnosis not present

## 2022-03-22 DIAGNOSIS — J9621 Acute and chronic respiratory failure with hypoxia: Secondary | ICD-10-CM | POA: Diagnosis not present

## 2022-03-22 DIAGNOSIS — Z87891 Personal history of nicotine dependence: Secondary | ICD-10-CM | POA: Diagnosis not present

## 2022-03-22 DIAGNOSIS — Z9181 History of falling: Secondary | ICD-10-CM | POA: Diagnosis not present

## 2022-03-22 DIAGNOSIS — J8410 Pulmonary fibrosis, unspecified: Secondary | ICD-10-CM | POA: Diagnosis not present

## 2022-03-22 DIAGNOSIS — A419 Sepsis, unspecified organism: Secondary | ICD-10-CM | POA: Diagnosis not present

## 2022-03-22 DIAGNOSIS — N179 Acute kidney failure, unspecified: Secondary | ICD-10-CM | POA: Diagnosis not present

## 2022-03-22 DIAGNOSIS — E1165 Type 2 diabetes mellitus with hyperglycemia: Secondary | ICD-10-CM | POA: Diagnosis not present

## 2022-03-22 DIAGNOSIS — G9341 Metabolic encephalopathy: Secondary | ICD-10-CM | POA: Diagnosis not present

## 2022-03-22 DIAGNOSIS — Z9981 Dependence on supplemental oxygen: Secondary | ICD-10-CM | POA: Diagnosis not present

## 2022-03-22 DIAGNOSIS — Z794 Long term (current) use of insulin: Secondary | ICD-10-CM | POA: Diagnosis not present

## 2022-03-22 DIAGNOSIS — E7800 Pure hypercholesterolemia, unspecified: Secondary | ICD-10-CM | POA: Diagnosis not present

## 2022-03-23 DIAGNOSIS — J441 Chronic obstructive pulmonary disease with (acute) exacerbation: Secondary | ICD-10-CM | POA: Diagnosis not present

## 2022-03-23 DIAGNOSIS — J9621 Acute and chronic respiratory failure with hypoxia: Secondary | ICD-10-CM | POA: Diagnosis not present

## 2022-03-23 DIAGNOSIS — I5022 Chronic systolic (congestive) heart failure: Secondary | ICD-10-CM | POA: Diagnosis not present

## 2022-03-23 DIAGNOSIS — Z7984 Long term (current) use of oral hypoglycemic drugs: Secondary | ICD-10-CM | POA: Diagnosis not present

## 2022-03-23 DIAGNOSIS — R6521 Severe sepsis with septic shock: Secondary | ICD-10-CM | POA: Diagnosis not present

## 2022-03-23 DIAGNOSIS — E1165 Type 2 diabetes mellitus with hyperglycemia: Secondary | ICD-10-CM | POA: Diagnosis not present

## 2022-03-23 DIAGNOSIS — Z794 Long term (current) use of insulin: Secondary | ICD-10-CM | POA: Diagnosis not present

## 2022-03-23 DIAGNOSIS — R627 Adult failure to thrive: Secondary | ICD-10-CM | POA: Diagnosis not present

## 2022-03-23 DIAGNOSIS — E1122 Type 2 diabetes mellitus with diabetic chronic kidney disease: Secondary | ICD-10-CM | POA: Diagnosis not present

## 2022-03-23 DIAGNOSIS — E559 Vitamin D deficiency, unspecified: Secondary | ICD-10-CM | POA: Diagnosis not present

## 2022-03-23 DIAGNOSIS — Z7901 Long term (current) use of anticoagulants: Secondary | ICD-10-CM | POA: Diagnosis not present

## 2022-03-23 DIAGNOSIS — Z87891 Personal history of nicotine dependence: Secondary | ICD-10-CM | POA: Diagnosis not present

## 2022-03-23 DIAGNOSIS — J440 Chronic obstructive pulmonary disease with acute lower respiratory infection: Secondary | ICD-10-CM | POA: Diagnosis not present

## 2022-03-23 DIAGNOSIS — N1832 Chronic kidney disease, stage 3b: Secondary | ICD-10-CM | POA: Diagnosis not present

## 2022-03-23 DIAGNOSIS — Z9981 Dependence on supplemental oxygen: Secondary | ICD-10-CM | POA: Diagnosis not present

## 2022-03-23 DIAGNOSIS — D500 Iron deficiency anemia secondary to blood loss (chronic): Secondary | ICD-10-CM | POA: Diagnosis not present

## 2022-03-23 DIAGNOSIS — I130 Hypertensive heart and chronic kidney disease with heart failure and stage 1 through stage 4 chronic kidney disease, or unspecified chronic kidney disease: Secondary | ICD-10-CM | POA: Diagnosis not present

## 2022-03-23 DIAGNOSIS — J8410 Pulmonary fibrosis, unspecified: Secondary | ICD-10-CM | POA: Diagnosis not present

## 2022-03-23 DIAGNOSIS — E7800 Pure hypercholesterolemia, unspecified: Secondary | ICD-10-CM | POA: Diagnosis not present

## 2022-03-23 DIAGNOSIS — G9341 Metabolic encephalopathy: Secondary | ICD-10-CM | POA: Diagnosis not present

## 2022-03-23 DIAGNOSIS — A419 Sepsis, unspecified organism: Secondary | ICD-10-CM | POA: Diagnosis not present

## 2022-03-23 DIAGNOSIS — N179 Acute kidney failure, unspecified: Secondary | ICD-10-CM | POA: Diagnosis not present

## 2022-03-23 DIAGNOSIS — Z9181 History of falling: Secondary | ICD-10-CM | POA: Diagnosis not present

## 2022-03-23 NOTE — Progress Notes (Deleted)
HPI: FU PAF and elevated troponin. Also with hypertension, DM, hyperlipidemia and pulmonary fibrosis (on home O2). Nuclear study April 2015 showed ejection fraction 69% with prior inferior infarct and very mild peri-infarct ischemia. Echocardiogram 6/23 showed normal LV function, grade 2 diastolic dysfunction, mild left atrial enlargement, trace aortic insufficiency.  Admitted 6/22 with pneumonia, sepsis, ARF; also with atrial fibrillation. Patient treated with antibiotics/steroids/bronchodilators with improvement.  He converted to sinus rhythm and renal function improved with hydration.  Noted to have elevated troponin (92 and 821) and cardiology asked to evaluate. Felt to be demand ischemia and treated medically. Since last seen,   Current Outpatient Medications  Medication Sig Dispense Refill   albuterol (PROVENTIL) (2.5 MG/3ML) 0.083% nebulizer solution INHALE 3ML BY NEBULIZATION ROUTE EVERY 6 HOURS AS NEEDED FOR WHEEZING OR SHORTNESS OF BREATH (Patient taking differently: Take 2.5 mg by nebulization every 6 (six) hours as needed for shortness of breath.) 3 mL 3   apixaban (ELIQUIS) 2.5 MG TABS tablet Take 1 tablet (2.5 mg total) by mouth 2 (two) times daily. (Patient taking differently: Take 5 mg by mouth daily.) 60 tablet 2   fluticasone-salmeterol (WIXELA INHUB) 500-50 MCG/ACT AEPB Inhale 1 puff into the lungs in the morning and at bedtime. This replaces symbicort, 1 each 11   furosemide (LASIX) 40 MG tablet Take 1 tablet (40 mg total) by mouth daily. 30 tablet 0   glucose blood (ONETOUCH ULTRA) test strip CHECK FASTING BLOOD SUGAR TWICE DAILY 50 strip 3   insulin glargine (LANTUS SOLOSTAR) 100 UNIT/ML Solostar Pen Inject 15 Units into the skin at bedtime. (Patient taking differently: Inject 15 Units into the skin See admin instructions. If sugar is elevated can go as high as 30 units at bedtime) 15 mL 11   Lancets (ONETOUCH DELICA PLUS [id number]) MISC USE TO CHECK BLOOD SUGAR TWICE DAILY  AS DIRECTED 100 each 0   metoprolol tartrate (LOPRESSOR) 25 MG tablet TAKE 1 TABLET BY MOUTH TWICE DAILY (Patient taking differently: Take 25 mg by mouth 2 (two) times daily.) 60 tablet 3   mometasone-formoterol (DULERA) 200-5 MCG/ACT AERO Inhale 2 puffs into the lungs 2 (two) times daily.     OXYGEN Inhale 3 L into the lungs continuous.      pioglitazone (ACTOS) 30 MG tablet Take 1 tablet (30 mg total) by mouth daily. 30 tablet 3   potassium chloride SA (KLOR-CON) 20 MEQ tablet Take 1 tablet by mouth once daily 90 tablet 0   pravastatin (PRAVACHOL) 80 MG tablet TAKE 1 TABLET BY MOUTH AT BEDTIME (Patient taking differently: Take 80 mg by mouth at bedtime.) 30 tablet 3   predniSONE (DELTASONE) 10 MG tablet Take 40 mg( 4 tablets)  daily for 1 day, 30 mg (3 tablets)  daily for 1 day, 20 mg ( 2 tablets) daily for 1 days,10 mg ( 1 tablet)  daily for 1 day, then stop 10 tablet 0   pregabalin (LYRICA) 100 MG capsule Take 1 capsule (100 mg total) by mouth 2 (two) times daily. (Patient taking differently: Take 100 mg by mouth daily as needed (pain).) 60 capsule 3   tiotropium (SPIRIVA HANDIHALER) 18 MCG inhalation capsule Place 1 capsule (18 mcg total) into inhaler and inhale daily. 30 capsule 2   No current facility-administered medications for this visit.     Past Medical History:  Diagnosis Date   Allergy    Rhinitis   Atrial fibrillation ([name])    Bronchitis    Chronic respiratory failure (  [place])    Colon polyps    COPD (chronic obstructive pulmonary disease) ([name])    Diabetes mellitus    Hypercholesterolemia    Hypertension    Iron deficiency anemia due to chronic blood loss 04/30/2018   Noncompliance    On home O2    2L N/C    PSA elevation    Pulmonary fibrosis ([name])    Vitamin D deficiency     Past Surgical History:  Procedure Laterality Date   BIOPSY  04/23/2018   Procedure: BIOPSY;  Surgeon: Danie Binder, MD;  Location: [facility];  Service: Endoscopy;;  duodenum gastric    CATARACT EXTRACTION W/PHACO  06/25/2012   Procedure: CATARACT EXTRACTION PHACO AND INTRAOCULAR LENS PLACEMENT ([place]);  Surgeon: Elta Guadeloupe T. Gershon Crane, MD;  Location: [facility];  Service: Ophthalmology;  Laterality: Left;  CDE=19.01   CATARACT EXTRACTION W/PHACO  07/09/2012   Procedure: CATARACT EXTRACTION PHACO AND INTRAOCULAR LENS PLACEMENT ([facility]);  Surgeon: Elta Guadeloupe T. Gershon Crane, MD;  Location: [facility];  Service: Ophthalmology;  Laterality: Right;  CDE: 20.09   COLONOSCOPY WITH PROPOFOL N/A 04/23/2018   Procedure: COLONOSCOPY WITH PROPOFOL;  Surgeon: Danie Binder, MD;  Location: [facility];  Service: Endoscopy;  Laterality: N/A;   ESOPHAGOGASTRODUODENOSCOPY (EGD) WITH PROPOFOL N/A 04/23/2018   Procedure: ESOPHAGOGASTRODUODENOSCOPY (EGD) WITH PROPOFOL;  Surgeon: Danie Binder, MD;  Location: [facility];  Service: Endoscopy;  Laterality: N/A;    Social History   Socioeconomic History   Marital status: Married    Spouse name: [name]   Number of children: 2   Years of education: Not on file   Highest education level: Not on file  Occupational History   Occupation: [employer]   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 Married 50+ years.   Grown twin sons.    Social Determinants of Health   Financial Resource Strain: Low Risk  (12/16/2021)   Overall Financial Resource Strain (CARDIA)    Difficulty of Paying Living Expenses: Not hard at all  Food Insecurity: No Food Insecurity (12/16/2021)   Hunger Vital Sign    Worried About Running Out of Food in the Last Year: Never  true    Ran Out of Food in the Last Year: Never true  Transportation Needs: No Transportation Needs (12/16/2021)   PRAPARE - Hydrologist (Medical): No    Lack of Transportation (Non-Medical): No  Physical Activity: Inactive (12/16/2021)   Exercise Vital Sign    Days of Exercise per Week: 0 days    Minutes of Exercise per Session: 0 min  Stress: No Stress Concern Present (12/16/2021)   [place]    Feeling of Stress : Not at all  Social Connections: [place] (12/16/2021)   Social Connection and Isolation Panel [NHANES]    Frequency of Communication with Friends and Family: More than three times a week  Frequency of Social Gatherings with Friends and Family: More than three times a week    Attends Religious Services: More than 4 times per year    Active Member of [employer] or Organizations: Yes    Attends Music therapist: More than 4 times per year    Marital Status: Married  Human resources officer Violence: Not At Risk (12/16/2021)   Humiliation, Afraid, Rape, and Kick questionnaire    Fear of Current or Ex-Partner: No    Emotionally Abused: No    Physically Abused: No    Sexually Abused: No    Family History  Problem Relation Age of Onset   Heart disease Mother    CAD Other    Diabetes Other     ROS: no fevers or chills, productive cough, hemoptysis, dysphasia, odynophagia, melena, hematochezia, dysuria, hematuria, rash, seizure activity, orthopnea, PND, pedal edema, claudication. Remaining systems are negative.  Physical Exam: Well-developed well-nourished in no acute distress.  Skin is warm and dry.  HEENT is normal.  Neck is supple.  Chest is clear to auscultation with normal expansion.  Cardiovascular exam is regular rate and rhythm.  Abdominal exam nontender or distended. No masses palpated. Extremities show no edema. neuro grossly intact  ECG- personally  reviewed  A/P  Elevated troponin-previous tropoinins 92 and 821 in setting of pneumonia, sepsis, ARF and atrial fibrillation; likely demand ischemia. He has not had CP. Echo showed normal LV function. Will not pursue further ischemia evaluation unless he develops CP in the future. Paroxysmal atrial fibrillation-remains in sinus. Continue apixaban and metoprolol. Severe home O2 dependent COPD-FU pulmonary/primary care. Recent ARF-improved with hydration. Hypertension-blood pressure controlled; continue present meds and follow. Hyperlipidemia-continue statin. Chronic diastolic CHF-continue lasix at present dose.  Kirk Ruths, MD

## 2022-03-24 ENCOUNTER — Ambulatory Visit (INDEPENDENT_AMBULATORY_CARE_PROVIDER_SITE_OTHER): Payer: Medicare Other | Admitting: Family Medicine

## 2022-03-24 VITALS — BP 134/78 | HR 84 | Temp 98.2°F | Ht 65.0 in | Wt 154.0 lb

## 2022-03-24 DIAGNOSIS — Z91148 Patient's other noncompliance with medication regimen for other reason: Secondary | ICD-10-CM

## 2022-03-24 DIAGNOSIS — J441 Chronic obstructive pulmonary disease with (acute) exacerbation: Secondary | ICD-10-CM | POA: Diagnosis not present

## 2022-03-24 MED ORDER — FLUTICASONE-SALMETEROL 500-50 MCG/ACT IN AEPB
1.0000 | INHALATION_SPRAY | Freq: Two times a day (BID) | RESPIRATORY_TRACT | 3 refills | Status: DC
Start: 2022-03-24 — End: 2022-04-24

## 2022-03-24 MED ORDER — SPIRIVA HANDIHALER 18 MCG IN CAPS: 18.0000 ug | ORAL_CAPSULE | Freq: Every day | RESPIRATORY_TRACT | 3 refills | Status: DC

## 2022-03-24 MED ORDER — PREGABALIN 100 MG PO CAPS
100.0000 mg | ORAL_CAPSULE | Freq: Two times a day (BID) | ORAL | 3 refills | Status: DC
Start: 2022-03-24 — End: 2022-04-24

## 2022-03-25 LAB — CBC WITH DIFFERENTIAL/PLATELET
Absolute Monocytes: 748 cells/uL (ref 200–950)
Basophils Absolute: 36 cells/uL (ref 0–200)
Basophils Relative: 0.4 %
Eosinophils Absolute: 374 cells/uL (ref 15–500)
Eosinophils Relative: 4.2 %
HCT: 35.9 % — ABNORMAL LOW (ref 38.5–50.0)
Hemoglobin: 11.4 g/dL — ABNORMAL LOW (ref 13.2–17.1)
Lymphs Abs: 1593 cells/uL (ref 850–3900)
MCH: 27.3 pg (ref 27.0–33.0)
MCHC: 31.8 g/dL — ABNORMAL LOW (ref 32.0–36.0)
MCV: 85.9 fL (ref 80.0–100.0)
MPV: 10.6 fL (ref 7.5–12.5)
Monocytes Relative: 8.4 %
Neutro Abs: 6150 cells/uL (ref 1500–7800)
Neutrophils Relative %: 69.1 %
Platelets: 464 10*3/uL — ABNORMAL HIGH (ref 140–400)
RBC: 4.18 10*6/uL — ABNORMAL LOW (ref 4.20–5.80)
RDW: 14.2 % (ref 11.0–15.0)
Total Lymphocyte: 17.9 %
WBC: 8.9 10*3/uL (ref 3.8–10.8)

## 2022-03-25 LAB — COMPLETE METABOLIC PANEL WITH GFR
AG Ratio: 0.9 (calc) — ABNORMAL LOW (ref 1.0–2.5)
ALT: 9 U/L (ref 9–46)
AST: 8 U/L — ABNORMAL LOW (ref 10–35)
Albumin: 2.6 g/dL — ABNORMAL LOW (ref 3.6–5.1)
Alkaline phosphatase (APISO): 84 U/L (ref 35–144)
BUN/Creatinine Ratio: 21 (calc) (ref 6–22)
BUN: 29 mg/dL — ABNORMAL HIGH (ref 7–25)
CO2: 28 mmol/L (ref 20–32)
Calcium: 8.2 mg/dL — ABNORMAL LOW (ref 8.6–10.3)
Chloride: 96 mmol/L — ABNORMAL LOW (ref 98–110)
Creat: 1.35 mg/dL — ABNORMAL HIGH (ref 0.70–1.22)
Globulin: 3 g/dL (calc) (ref 1.9–3.7)
Glucose, Bld: 280 mg/dL — ABNORMAL HIGH (ref 65–99)
Potassium: 5.1 mmol/L (ref 3.5–5.3)
Sodium: 132 mmol/L — ABNORMAL LOW (ref 135–146)
Total Bilirubin: 0.2 mg/dL (ref 0.2–1.2)
Total Protein: 5.6 g/dL — ABNORMAL LOW (ref 6.1–8.1)
eGFR: 52 mL/min/{1.73_m2} — ABNORMAL LOW (ref >=60)

## 2022-03-27 ENCOUNTER — Telehealth: Payer: Self-pay

## 2022-03-27 DIAGNOSIS — J9621 Acute and chronic respiratory failure with hypoxia: Secondary | ICD-10-CM | POA: Diagnosis not present

## 2022-03-27 DIAGNOSIS — J440 Chronic obstructive pulmonary disease with acute lower respiratory infection: Secondary | ICD-10-CM | POA: Diagnosis not present

## 2022-03-27 DIAGNOSIS — A419 Sepsis, unspecified organism: Secondary | ICD-10-CM | POA: Diagnosis not present

## 2022-03-27 DIAGNOSIS — J441 Chronic obstructive pulmonary disease with (acute) exacerbation: Secondary | ICD-10-CM | POA: Diagnosis not present

## 2022-03-27 DIAGNOSIS — G9341 Metabolic encephalopathy: Secondary | ICD-10-CM | POA: Diagnosis not present

## 2022-03-27 DIAGNOSIS — R6521 Severe sepsis with septic shock: Secondary | ICD-10-CM | POA: Diagnosis not present

## 2022-03-27 DIAGNOSIS — J189 Pneumonia, unspecified organism: Secondary | ICD-10-CM | POA: Diagnosis not present

## 2022-03-28 ENCOUNTER — Telehealth: Payer: Self-pay

## 2022-03-28 DIAGNOSIS — J441 Chronic obstructive pulmonary disease with (acute) exacerbation: Secondary | ICD-10-CM | POA: Diagnosis not present

## 2022-03-28 DIAGNOSIS — Z7901 Long term (current) use of anticoagulants: Secondary | ICD-10-CM | POA: Diagnosis not present

## 2022-03-28 DIAGNOSIS — E7800 Pure hypercholesterolemia, unspecified: Secondary | ICD-10-CM | POA: Diagnosis not present

## 2022-03-28 DIAGNOSIS — Z87891 Personal history of nicotine dependence: Secondary | ICD-10-CM | POA: Diagnosis not present

## 2022-03-28 DIAGNOSIS — E1122 Type 2 diabetes mellitus with diabetic chronic kidney disease: Secondary | ICD-10-CM | POA: Diagnosis not present

## 2022-03-28 DIAGNOSIS — D500 Iron deficiency anemia secondary to blood loss (chronic): Secondary | ICD-10-CM | POA: Diagnosis not present

## 2022-03-28 DIAGNOSIS — J9621 Acute and chronic respiratory failure with hypoxia: Secondary | ICD-10-CM | POA: Diagnosis not present

## 2022-03-28 DIAGNOSIS — Z794 Long term (current) use of insulin: Secondary | ICD-10-CM | POA: Diagnosis not present

## 2022-03-28 DIAGNOSIS — I130 Hypertensive heart and chronic kidney disease with heart failure and stage 1 through stage 4 chronic kidney disease, or unspecified chronic kidney disease: Secondary | ICD-10-CM | POA: Diagnosis not present

## 2022-03-28 DIAGNOSIS — R6521 Severe sepsis with septic shock: Secondary | ICD-10-CM | POA: Diagnosis not present

## 2022-03-28 DIAGNOSIS — A419 Sepsis, unspecified organism: Secondary | ICD-10-CM | POA: Diagnosis not present

## 2022-03-28 DIAGNOSIS — Z7984 Long term (current) use of oral hypoglycemic drugs: Secondary | ICD-10-CM | POA: Diagnosis not present

## 2022-03-28 DIAGNOSIS — E559 Vitamin D deficiency, unspecified: Secondary | ICD-10-CM | POA: Diagnosis not present

## 2022-03-28 DIAGNOSIS — N1832 Chronic kidney disease, stage 3b: Secondary | ICD-10-CM | POA: Diagnosis not present

## 2022-03-28 DIAGNOSIS — N179 Acute kidney failure, unspecified: Secondary | ICD-10-CM | POA: Diagnosis not present

## 2022-03-28 DIAGNOSIS — G9341 Metabolic encephalopathy: Secondary | ICD-10-CM | POA: Diagnosis not present

## 2022-03-28 DIAGNOSIS — R627 Adult failure to thrive: Secondary | ICD-10-CM | POA: Diagnosis not present

## 2022-03-28 DIAGNOSIS — J440 Chronic obstructive pulmonary disease with acute lower respiratory infection: Secondary | ICD-10-CM | POA: Diagnosis not present

## 2022-03-28 DIAGNOSIS — Z9981 Dependence on supplemental oxygen: Secondary | ICD-10-CM | POA: Diagnosis not present

## 2022-03-28 DIAGNOSIS — E1165 Type 2 diabetes mellitus with hyperglycemia: Secondary | ICD-10-CM | POA: Diagnosis not present

## 2022-03-28 DIAGNOSIS — J8410 Pulmonary fibrosis, unspecified: Secondary | ICD-10-CM | POA: Diagnosis not present

## 2022-03-28 DIAGNOSIS — I5022 Chronic systolic (congestive) heart failure: Secondary | ICD-10-CM | POA: Diagnosis not present

## 2022-03-28 DIAGNOSIS — Z9181 History of falling: Secondary | ICD-10-CM | POA: Diagnosis not present

## 2022-03-29 ENCOUNTER — Telehealth (HOSPITAL_COMMUNITY): Payer: Self-pay

## 2022-03-29 DIAGNOSIS — J8410 Pulmonary fibrosis, unspecified: Secondary | ICD-10-CM | POA: Diagnosis not present

## 2022-03-29 DIAGNOSIS — R6521 Severe sepsis with septic shock: Secondary | ICD-10-CM | POA: Diagnosis not present

## 2022-03-29 DIAGNOSIS — Z9181 History of falling: Secondary | ICD-10-CM | POA: Diagnosis not present

## 2022-03-29 DIAGNOSIS — Z7984 Long term (current) use of oral hypoglycemic drugs: Secondary | ICD-10-CM | POA: Diagnosis not present

## 2022-03-29 DIAGNOSIS — Z9981 Dependence on supplemental oxygen: Secondary | ICD-10-CM | POA: Diagnosis not present

## 2022-03-29 DIAGNOSIS — R627 Adult failure to thrive: Secondary | ICD-10-CM | POA: Diagnosis not present

## 2022-03-29 DIAGNOSIS — A419 Sepsis, unspecified organism: Secondary | ICD-10-CM | POA: Diagnosis not present

## 2022-03-29 DIAGNOSIS — I130 Hypertensive heart and chronic kidney disease with heart failure and stage 1 through stage 4 chronic kidney disease, or unspecified chronic kidney disease: Secondary | ICD-10-CM | POA: Diagnosis not present

## 2022-03-29 DIAGNOSIS — Z7901 Long term (current) use of anticoagulants: Secondary | ICD-10-CM | POA: Diagnosis not present

## 2022-03-29 DIAGNOSIS — J440 Chronic obstructive pulmonary disease with acute lower respiratory infection: Secondary | ICD-10-CM | POA: Diagnosis not present

## 2022-03-29 DIAGNOSIS — Z87891 Personal history of nicotine dependence: Secondary | ICD-10-CM | POA: Diagnosis not present

## 2022-03-29 DIAGNOSIS — G9341 Metabolic encephalopathy: Secondary | ICD-10-CM | POA: Diagnosis not present

## 2022-03-29 DIAGNOSIS — I5022 Chronic systolic (congestive) heart failure: Secondary | ICD-10-CM | POA: Diagnosis not present

## 2022-03-29 DIAGNOSIS — N1832 Chronic kidney disease, stage 3b: Secondary | ICD-10-CM | POA: Diagnosis not present

## 2022-03-29 DIAGNOSIS — E1165 Type 2 diabetes mellitus with hyperglycemia: Secondary | ICD-10-CM | POA: Diagnosis not present

## 2022-03-29 DIAGNOSIS — E559 Vitamin D deficiency, unspecified: Secondary | ICD-10-CM | POA: Diagnosis not present

## 2022-03-29 DIAGNOSIS — E1122 Type 2 diabetes mellitus with diabetic chronic kidney disease: Secondary | ICD-10-CM | POA: Diagnosis not present

## 2022-03-29 DIAGNOSIS — D500 Iron deficiency anemia secondary to blood loss (chronic): Secondary | ICD-10-CM | POA: Diagnosis not present

## 2022-03-29 DIAGNOSIS — E7800 Pure hypercholesterolemia, unspecified: Secondary | ICD-10-CM | POA: Diagnosis not present

## 2022-03-29 DIAGNOSIS — J441 Chronic obstructive pulmonary disease with (acute) exacerbation: Secondary | ICD-10-CM | POA: Diagnosis not present

## 2022-03-29 DIAGNOSIS — J9621 Acute and chronic respiratory failure with hypoxia: Secondary | ICD-10-CM | POA: Diagnosis not present

## 2022-03-29 DIAGNOSIS — Z794 Long term (current) use of insulin: Secondary | ICD-10-CM | POA: Diagnosis not present

## 2022-03-29 DIAGNOSIS — N179 Acute kidney failure, unspecified: Secondary | ICD-10-CM | POA: Diagnosis not present

## 2022-03-29 NOTE — Telephone Encounter (Signed)
Transitions of Care Pharmacy   Call attempted for a pharmacy transitions of care follow-up. Spoke with patient's son who was not at home. Requested callback at another time.  Call attempt #1. Will follow-up in 2-3 days.

## 2022-04-01 DIAGNOSIS — J449 Chronic obstructive pulmonary disease, unspecified: Secondary | ICD-10-CM | POA: Diagnosis not present

## 2022-04-03 ENCOUNTER — Ambulatory Visit: Payer: Medicare Other | Admitting: Cardiology

## 2022-04-06 ENCOUNTER — Other Ambulatory Visit (HOSPITAL_COMMUNITY): Payer: Self-pay

## 2022-04-06 ENCOUNTER — Telehealth (HOSPITAL_COMMUNITY): Payer: Self-pay

## 2022-04-06 NOTE — Telephone Encounter (Signed)
Pharmacy Transitions of Care Follow-up Telephone Call  Date of discharge: 03/14/2022  Discharge Diagnosis: Atrial Fibrillation   How have you been since you were released from the hospital? Patient has been doing well since discharge. He has no questions or concerns regarding medications.    Medication changes made at discharge: CHANGE how you take: apixaban (Eliquis)  Lantus SoloStar (insulin glargine)  STOP taking: aspirin EC 81 MG tablet  cloNIDine 0.1 MG tablet (CATAPRES)  gabapentin 100 MG capsule (NEURONTIN)  ipratropium-albuterol 0.5-2.5 (3) MG/3ML Soln (DUONEB)  lidocaine 4 % (HM Lidocaine Patch)  losartan 25 MG tablet (Cozaar)  predniSONE 20 MG tablet (DELTASONE)  SYMBICORT IN  tamsulosin 0.4 MG Caps capsule (FLOMAX)  traMADol 50 MG tablet (ULTRAM  Medication changes verified by the patient? Yes    Medication Accessibility:  Home Pharmacy: [facility]   Was the patient provided with refills on discharged medications? Yes   Have all prescriptions been transferred from [facility] to home pharmacy? Yes   Is the patient able to afford medications? Has insurance, [facility] Medicare  Medication Review: APIXABAN (ELIQUIS)  Apixaban 5 mg daily initiated on 03/14/2022.  - Discussed importance of taking medication around the same time everyday  - Advised patient of medications to avoid (NSAIDs, ASA)  - Educated that Tylenol (acetaminophen) will be the preferred analgesic to prevent risk of bleeding  - Emphasized importance of monitoring for signs and symptoms of bleeding (abnormal bruising, prolonged bleeding, nose bleeds, bleeding from gums, discolored urine, black tarry stools)  - Advised patient to alert all providers of anticoagulation therapy prior to starting a new medication or having a procedure   Follow-up Appointments:  PCP Hospital f/u appt confirmed? Seen Jenna Luo, MD on 03/24/2022.   If their condition worsens, is the pt aware to call PCP or go to the Emergency Dept.?  Yes  Final Patient Assessment: Patient has refills at home pharmacy and has already had follow-up.

## 2022-04-13 ENCOUNTER — Other Ambulatory Visit: Payer: Self-pay | Admitting: Family Medicine

## 2022-04-13 DIAGNOSIS — R600 Localized edema: Secondary | ICD-10-CM

## 2022-04-13 DIAGNOSIS — Z09 Encounter for follow-up examination after completed treatment for conditions other than malignant neoplasm: Secondary | ICD-10-CM

## 2022-04-13 DIAGNOSIS — I5032 Chronic diastolic (congestive) heart failure: Secondary | ICD-10-CM

## 2022-04-14 ENCOUNTER — Emergency Department (HOSPITAL_COMMUNITY): Payer: Medicare Other

## 2022-04-14 ENCOUNTER — Other Ambulatory Visit: Payer: Self-pay

## 2022-04-14 ENCOUNTER — Inpatient Hospital Stay (HOSPITAL_COMMUNITY)
Admission: EM | Admit: 2022-04-14 | Discharge: 2022-04-24 | DRG: 190 | Disposition: A | Discharge status: Hospice | Payer: Medicare Other | Attending: Family Medicine | Admitting: Family Medicine

## 2022-04-14 ENCOUNTER — Encounter (HOSPITAL_COMMUNITY): Payer: Self-pay

## 2022-04-14 DIAGNOSIS — N179 Acute kidney failure, unspecified: Secondary | ICD-10-CM | POA: Diagnosis not present

## 2022-04-14 DIAGNOSIS — E1142 Type 2 diabetes mellitus with diabetic polyneuropathy: Secondary | ICD-10-CM | POA: Diagnosis not present

## 2022-04-14 DIAGNOSIS — R059 Cough, unspecified: Secondary | ICD-10-CM | POA: Diagnosis not present

## 2022-04-14 DIAGNOSIS — Y95 Nosocomial condition: Secondary | ICD-10-CM | POA: Diagnosis present

## 2022-04-14 DIAGNOSIS — R451 Restlessness and agitation: Secondary | ICD-10-CM | POA: Diagnosis not present

## 2022-04-14 DIAGNOSIS — Z833 Family history of diabetes mellitus: Secondary | ICD-10-CM

## 2022-04-14 DIAGNOSIS — Z7189 Other specified counseling: Secondary | ICD-10-CM | POA: Diagnosis not present

## 2022-04-14 DIAGNOSIS — G9341 Metabolic encephalopathy: Secondary | ICD-10-CM | POA: Diagnosis not present

## 2022-04-14 DIAGNOSIS — G3189 Other specified degenerative diseases of nervous system: Secondary | ICD-10-CM | POA: Diagnosis not present

## 2022-04-14 DIAGNOSIS — E7800 Pure hypercholesterolemia, unspecified: Secondary | ICD-10-CM | POA: Diagnosis present

## 2022-04-14 DIAGNOSIS — E871 Hypo-osmolality and hyponatremia: Secondary | ICD-10-CM | POA: Diagnosis not present

## 2022-04-14 DIAGNOSIS — I6782 Cerebral ischemia: Secondary | ICD-10-CM | POA: Diagnosis not present

## 2022-04-14 DIAGNOSIS — J90 Pleural effusion, not elsewhere classified: Secondary | ICD-10-CM | POA: Diagnosis not present

## 2022-04-14 DIAGNOSIS — G928 Other toxic encephalopathy: Secondary | ICD-10-CM | POA: Diagnosis not present

## 2022-04-14 DIAGNOSIS — Z79899 Other long term (current) drug therapy: Secondary | ICD-10-CM

## 2022-04-14 DIAGNOSIS — Z515 Encounter for palliative care: Secondary | ICD-10-CM

## 2022-04-14 DIAGNOSIS — I130 Hypertensive heart and chronic kidney disease with heart failure and stage 1 through stage 4 chronic kidney disease, or unspecified chronic kidney disease: Secondary | ICD-10-CM | POA: Diagnosis present

## 2022-04-14 DIAGNOSIS — Z8249 Family history of ischemic heart disease and other diseases of the circulatory system: Secondary | ICD-10-CM

## 2022-04-14 DIAGNOSIS — J440 Chronic obstructive pulmonary disease with acute lower respiratory infection: Secondary | ICD-10-CM | POA: Diagnosis not present

## 2022-04-14 DIAGNOSIS — T368X5A Adverse effect of other systemic antibiotics, initial encounter: Secondary | ICD-10-CM | POA: Diagnosis not present

## 2022-04-14 DIAGNOSIS — Z66 Do not resuscitate: Secondary | ICD-10-CM | POA: Diagnosis not present

## 2022-04-14 DIAGNOSIS — J9611 Chronic respiratory failure with hypoxia: Secondary | ICD-10-CM | POA: Diagnosis not present

## 2022-04-14 DIAGNOSIS — I480 Paroxysmal atrial fibrillation: Secondary | ICD-10-CM | POA: Diagnosis not present

## 2022-04-14 DIAGNOSIS — E1159 Type 2 diabetes mellitus with other circulatory complications: Secondary | ICD-10-CM | POA: Diagnosis not present

## 2022-04-14 DIAGNOSIS — J189 Pneumonia, unspecified organism: Secondary | ICD-10-CM | POA: Diagnosis present

## 2022-04-14 DIAGNOSIS — Z87891 Personal history of nicotine dependence: Secondary | ICD-10-CM

## 2022-04-14 DIAGNOSIS — N19 Unspecified kidney failure: Secondary | ICD-10-CM | POA: Diagnosis not present

## 2022-04-14 DIAGNOSIS — N170 Acute kidney failure with tubular necrosis: Secondary | ICD-10-CM | POA: Diagnosis not present

## 2022-04-14 DIAGNOSIS — Z7951 Long term (current) use of inhaled steroids: Secondary | ICD-10-CM

## 2022-04-14 DIAGNOSIS — E1122 Type 2 diabetes mellitus with diabetic chronic kidney disease: Secondary | ICD-10-CM | POA: Diagnosis present

## 2022-04-14 DIAGNOSIS — Z794 Long term (current) use of insulin: Secondary | ICD-10-CM

## 2022-04-14 DIAGNOSIS — E11649 Type 2 diabetes mellitus with hypoglycemia without coma: Secondary | ICD-10-CM | POA: Diagnosis not present

## 2022-04-14 DIAGNOSIS — E559 Vitamin D deficiency, unspecified: Secondary | ICD-10-CM | POA: Diagnosis not present

## 2022-04-14 DIAGNOSIS — R5381 Other malaise: Secondary | ICD-10-CM | POA: Diagnosis present

## 2022-04-14 DIAGNOSIS — J441 Chronic obstructive pulmonary disease with (acute) exacerbation: Principal | ICD-10-CM | POA: Diagnosis present

## 2022-04-14 DIAGNOSIS — N1832 Chronic kidney disease, stage 3b: Secondary | ICD-10-CM | POA: Diagnosis not present

## 2022-04-14 DIAGNOSIS — E1165 Type 2 diabetes mellitus with hyperglycemia: Secondary | ICD-10-CM | POA: Diagnosis present

## 2022-04-14 DIAGNOSIS — Z961 Presence of intraocular lens: Secondary | ICD-10-CM | POA: Diagnosis present

## 2022-04-14 DIAGNOSIS — R0602 Shortness of breath: Secondary | ICD-10-CM | POA: Diagnosis not present

## 2022-04-14 DIAGNOSIS — G319 Degenerative disease of nervous system, unspecified: Secondary | ICD-10-CM | POA: Diagnosis not present

## 2022-04-14 DIAGNOSIS — I5032 Chronic diastolic (congestive) heart failure: Secondary | ICD-10-CM | POA: Diagnosis not present

## 2022-04-14 DIAGNOSIS — R278 Other lack of coordination: Secondary | ICD-10-CM | POA: Diagnosis not present

## 2022-04-14 DIAGNOSIS — R29818 Other symptoms and signs involving the nervous system: Secondary | ICD-10-CM | POA: Diagnosis not present

## 2022-04-14 DIAGNOSIS — Z22322 Carrier or suspected carrier of Methicillin resistant Staphylococcus aureus: Secondary | ICD-10-CM

## 2022-04-14 DIAGNOSIS — J8410 Pulmonary fibrosis, unspecified: Secondary | ICD-10-CM | POA: Diagnosis present

## 2022-04-14 DIAGNOSIS — Z7901 Long term (current) use of anticoagulants: Secondary | ICD-10-CM

## 2022-04-14 DIAGNOSIS — Z888 Allergy status to other drugs, medicaments and biological substances status: Secondary | ICD-10-CM

## 2022-04-14 DIAGNOSIS — D631 Anemia in chronic kidney disease: Secondary | ICD-10-CM | POA: Diagnosis not present

## 2022-04-14 DIAGNOSIS — Z9981 Dependence on supplemental oxygen: Secondary | ICD-10-CM | POA: Diagnosis not present

## 2022-04-14 DIAGNOSIS — I10 Essential (primary) hypertension: Secondary | ICD-10-CM | POA: Diagnosis present

## 2022-04-14 LAB — CBC WITH DIFFERENTIAL/PLATELET
Abs Immature Granulocytes: 0.03 10*3/uL (ref 0.00–0.07)
Basophils Absolute: 0.1 10*3/uL (ref 0.0–0.1)
Basophils Relative: 1 %
Eosinophils Absolute: 0.3 10*3/uL (ref 0.0–0.5)
Eosinophils Relative: 4 %
HCT: 37.1 % — ABNORMAL LOW (ref 39.0–52.0)
Hemoglobin: 11.9 g/dL — ABNORMAL LOW (ref 13.0–17.0)
Immature Granulocytes: 0 %
Lymphocytes Relative: 23 %
Lymphs Abs: 1.6 10*3/uL (ref 0.7–4.0)
MCH: 28 pg (ref 26.0–34.0)
MCHC: 32.1 g/dL (ref 30.0–36.0)
MCV: 87.3 fL (ref 80.0–100.0)
Monocytes Absolute: 0.7 10*3/uL (ref 0.1–1.0)
Monocytes Relative: 10 %
Neutro Abs: 4.3 10*3/uL (ref 1.7–7.7)
Neutrophils Relative %: 62 %
Platelets: 272 10*3/uL (ref 150–400)
RBC: 4.25 MIL/uL (ref 4.22–5.81)
RDW: 15.3 % (ref 11.5–15.5)
WBC: 7 10*3/uL (ref 4.0–10.5)
nRBC: 0 % (ref 0.0–0.2)

## 2022-04-14 LAB — BLOOD GAS, VENOUS
Acid-base deficit: 1 mmol/L (ref 0.0–2.0)
Bicarbonate: 27.6 mmol/L (ref 20.0–28.0)
Drawn by: 12816
FIO2: 32 %
O2 Saturation: 63.5 %
Patient temperature: 36.6
pCO2, Ven: 62 mmHg — ABNORMAL HIGH (ref 44–60)
pH, Ven: 7.26 (ref 7.25–7.43)
pO2, Ven: 37 mmHg (ref 32–45)

## 2022-04-14 LAB — COMPREHENSIVE METABOLIC PANEL
ALT: 12 U/L (ref 0–44)
AST: 15 U/L (ref 15–41)
Albumin: 3.3 g/dL — ABNORMAL LOW (ref 3.5–5.0)
Alkaline Phosphatase: 74 U/L (ref 38–126)
Anion gap: 7 (ref 5–15)
BUN: 40 mg/dL — ABNORMAL HIGH (ref 8–23)
CO2: 26 mmol/L (ref 22–32)
Calcium: 8.8 mg/dL — ABNORMAL LOW (ref 8.9–10.3)
Chloride: 100 mmol/L (ref 98–111)
Creatinine, Ser: 1.13 mg/dL (ref 0.61–1.24)
GFR, Estimated: >60 mL/min (ref >60)
Glucose, Bld: 231 mg/dL — ABNORMAL HIGH (ref 70–99)
Potassium: 3.9 mmol/L (ref 3.5–5.1)
Sodium: 133 mmol/L — ABNORMAL LOW (ref 135–145)
Total Bilirubin: 0.3 mg/dL (ref 0.3–1.2)
Total Protein: 7.4 g/dL (ref 6.5–8.1)

## 2022-04-14 LAB — PROCALCITONIN: Procalcitonin: 0.17 ng/mL

## 2022-04-14 LAB — BRAIN NATRIURETIC PEPTIDE: B Natriuretic Peptide: 315 pg/mL — ABNORMAL HIGH (ref 0.0–100.0)

## 2022-04-14 LAB — CBG MONITORING, ED
Glucose-Capillary: 241 mg/dL — ABNORMAL HIGH (ref 70–99)
Glucose-Capillary: 217 mg/dL — ABNORMAL HIGH (ref 70–99)
Glucose-Capillary: 159 mg/dL — ABNORMAL HIGH (ref 70–99)

## 2022-04-14 LAB — GLUCOSE, CAPILLARY
Glucose-Capillary: 124 mg/dL — ABNORMAL HIGH (ref 70–99)
Glucose-Capillary: 177 mg/dL — ABNORMAL HIGH (ref 70–99)

## 2022-04-14 LAB — TROPONIN I (HIGH SENSITIVITY)
Troponin I (High Sensitivity): 8 ng/L (ref <18)
Troponin I (High Sensitivity): 7 ng/L (ref <18)

## 2022-04-14 MED ORDER — METHYLPREDNISOLONE SODIUM SUCC 125 MG IJ SOLR
125.0000 mg | Freq: Once | INTRAMUSCULAR | Status: AC
  Administered 2022-04-14: 125 mg via INTRAVENOUS
  Filled 2022-04-14: qty 2

## 2022-04-14 MED ORDER — IPRATROPIUM-ALBUTEROL 0.5-2.5 (3) MG/3ML IN SOLN: 3.0000 mL | Freq: Once | RESPIRATORY_TRACT | Status: AC

## 2022-04-14 MED ORDER — ALBUTEROL SULFATE HFA 108 (90 BASE) MCG/ACT IN AERS: 2.0000 | INHALATION_SPRAY | RESPIRATORY_TRACT | Status: DC | PRN

## 2022-04-14 MED ORDER — GUAIFENESIN-DM 100-10 MG/5ML PO SYRP
5.0000 mL | ORAL_SOLUTION | ORAL | Status: DC | PRN
Start: 2022-04-14 — End: 2022-04-24

## 2022-04-14 MED ORDER — UMECLIDINIUM BROMIDE 62.5 MCG/ACT IN AEPB
1.0000 | INHALATION_SPRAY | Freq: Every day | RESPIRATORY_TRACT | Status: DC
Start: 2022-04-14 — End: 2022-04-14
  Administered 2022-04-14: 1 via RESPIRATORY_TRACT
  Filled 2022-04-14: qty 7

## 2022-04-14 MED ORDER — ALBUTEROL SULFATE (2.5 MG/3ML) 0.083% IN NEBU: 2.5000 mg | INHALATION_SOLUTION | RESPIRATORY_TRACT | Status: DC | PRN

## 2022-04-14 MED ORDER — PREGABALIN 50 MG PO CAPS
100.0000 mg | ORAL_CAPSULE | Freq: Two times a day (BID) | ORAL | Status: DC
Start: 2022-04-14 — End: 2022-04-17
  Administered 2022-04-14 – 2022-04-16 (×5): 100 mg via ORAL
  Filled 2022-04-14 (×6): qty 2

## 2022-04-14 MED ORDER — FUROSEMIDE 40 MG PO TABS
40.0000 mg | ORAL_TABLET | Freq: Every day | ORAL | Status: DC
  Administered 2022-04-15 – 2022-04-16 (×2): 40 mg via ORAL
  Filled 2022-04-14 (×2): qty 1

## 2022-04-14 MED ORDER — ALBUTEROL (5 MG/ML) CONTINUOUS INHALATION SOLN
10.0000 mg/h | INHALATION_SOLUTION | RESPIRATORY_TRACT | Status: DC
  Filled 2022-04-14: qty 20

## 2022-04-14 MED ORDER — ALBUTEROL SULFATE (2.5 MG/3ML) 0.083% IN NEBU
INHALATION_SOLUTION | RESPIRATORY_TRACT | Status: AC
  Administered 2022-04-14: 10 mg
  Filled 2022-04-14: qty 12

## 2022-04-14 MED ORDER — PRAVASTATIN SODIUM 40 MG PO TABS
80.0000 mg | ORAL_TABLET | Freq: Every day | ORAL | Status: DC
Start: 2022-04-14 — End: 2022-04-19
  Administered 2022-04-14 – 2022-04-17 (×4): 80 mg via ORAL
  Filled 2022-04-14 (×4): qty 2

## 2022-04-14 MED ORDER — VANCOMYCIN HCL 750 MG/150ML IV SOLN
750.0000 mg | Freq: Two times a day (BID) | INTRAVENOUS | Status: DC
  Administered 2022-04-14 – 2022-04-16 (×4): 750 mg via INTRAVENOUS
  Filled 2022-04-14 (×6): qty 150

## 2022-04-14 MED ORDER — ONDANSETRON HCL 4 MG PO TABS: 4.0000 mg | ORAL_TABLET | Freq: Four times a day (QID) | ORAL | Status: DC | PRN

## 2022-04-14 MED ORDER — ARFORMOTEROL TARTRATE 15 MCG/2ML IN NEBU
15.0000 ug | INHALATION_SOLUTION | Freq: Two times a day (BID) | RESPIRATORY_TRACT | Status: DC
  Administered 2022-04-14 – 2022-04-23 (×18): 15 ug via RESPIRATORY_TRACT
  Filled 2022-04-14 (×20): qty 2

## 2022-04-14 MED ORDER — ACETAMINOPHEN 325 MG PO TABS: 650.0000 mg | ORAL_TABLET | Freq: Four times a day (QID) | ORAL | Status: DC | PRN

## 2022-04-14 MED ORDER — IOHEXOL 300 MG/ML  SOLN
75.0000 mL | Freq: Once | INTRAMUSCULAR | Status: AC | PRN
  Administered 2022-04-14: 75 mL via INTRAVENOUS

## 2022-04-14 MED ORDER — INSULIN ASPART 100 UNIT/ML IJ SOLN
0.0000 [IU] | Freq: Three times a day (TID) | INTRAMUSCULAR | Status: DC
  Administered 2022-04-14: 5 [IU] via SUBCUTANEOUS
  Administered 2022-04-14: 2 [IU] via SUBCUTANEOUS
  Administered 2022-04-15 (×2): 8 [IU] via SUBCUTANEOUS
  Administered 2022-04-15 – 2022-04-16 (×2): 3 [IU] via SUBCUTANEOUS
  Administered 2022-04-16: 5 [IU] via SUBCUTANEOUS
  Administered 2022-04-16: 3 [IU] via SUBCUTANEOUS
  Administered 2022-04-17 – 2022-04-20 (×3): 2 [IU] via SUBCUTANEOUS
  Filled 2022-04-14: qty 1

## 2022-04-14 MED ORDER — PIOGLITAZONE HCL 15 MG PO TABS
30.0000 mg | ORAL_TABLET | Freq: Every day | ORAL | Status: DC
  Administered 2022-04-15 – 2022-04-17 (×3): 30 mg via ORAL
  Filled 2022-04-14: qty 1
  Filled 2022-04-14: qty 2
  Filled 2022-04-14: qty 4
  Filled 2022-04-14: qty 1
  Filled 2022-04-14: qty 2
  Filled 2022-04-14 (×2): qty 1

## 2022-04-14 MED ORDER — INSULIN ASPART 100 UNIT/ML IJ SOLN
0.0000 [IU] | Freq: Every day | INTRAMUSCULAR | Status: DC
  Administered 2022-04-15 – 2022-04-16 (×2): 2 [IU] via SUBCUTANEOUS

## 2022-04-14 MED ORDER — CLONIDINE HCL 0.1 MG PO TABS
0.1000 mg | ORAL_TABLET | Freq: Two times a day (BID) | ORAL | Status: DC
  Administered 2022-04-14 – 2022-04-17 (×7): 0.1 mg via ORAL
  Filled 2022-04-14 (×9): qty 1

## 2022-04-14 MED ORDER — ONDANSETRON HCL 4 MG/2ML IJ SOLN: 4.0000 mg | Freq: Four times a day (QID) | INTRAMUSCULAR | Status: DC | PRN

## 2022-04-14 MED ORDER — SODIUM CHLORIDE 0.9 % IV SOLN
2.0000 g | Freq: Two times a day (BID) | INTRAVENOUS | Status: DC
  Administered 2022-04-14 – 2022-04-16 (×5): 2 g via INTRAVENOUS
  Filled 2022-04-14 (×5): qty 12.5

## 2022-04-14 MED ORDER — BUDESONIDE 0.25 MG/2ML IN SUSP
0.2500 mg | Freq: Two times a day (BID) | RESPIRATORY_TRACT | Status: DC
  Administered 2022-04-14 – 2022-04-23 (×18): 0.25 mg via RESPIRATORY_TRACT
  Filled 2022-04-14 (×19): qty 2

## 2022-04-14 MED ORDER — IPRATROPIUM-ALBUTEROL 0.5-2.5 (3) MG/3ML IN SOLN: 3.0000 mL | RESPIRATORY_TRACT | Status: DC | PRN

## 2022-04-14 MED ORDER — ACETAMINOPHEN 650 MG RE SUPP: 650.0000 mg | Freq: Four times a day (QID) | RECTAL | Status: DC | PRN

## 2022-04-14 MED ORDER — VANCOMYCIN HCL 1500 MG/300ML IV SOLN
1500.0000 mg | Freq: Once | INTRAVENOUS | Status: AC
  Administered 2022-04-14: 1500 mg via INTRAVENOUS
  Filled 2022-04-14: qty 300

## 2022-04-14 MED ORDER — IPRATROPIUM-ALBUTEROL 0.5-2.5 (3) MG/3ML IN SOLN
RESPIRATORY_TRACT | Status: AC
  Administered 2022-04-14: 3 mL via RESPIRATORY_TRACT
  Filled 2022-04-14: qty 3

## 2022-04-14 MED ORDER — INSULIN GLARGINE-YFGN 100 UNIT/ML ~~LOC~~ SOLN
15.0000 [IU] | Freq: Every day | SUBCUTANEOUS | Status: DC
  Administered 2022-04-14 – 2022-04-17 (×4): 15 [IU] via SUBCUTANEOUS
  Filled 2022-04-14 (×7): qty 0.15

## 2022-04-14 MED ORDER — ENOXAPARIN SODIUM 40 MG/0.4ML IJ SOSY: 40.0000 mg | PREFILLED_SYRINGE | INTRAMUSCULAR | Status: DC

## 2022-04-14 MED ORDER — POTASSIUM CHLORIDE CRYS ER 20 MEQ PO TBCR
20.0000 meq | EXTENDED_RELEASE_TABLET | Freq: Every day | ORAL | Status: DC
  Administered 2022-04-15 – 2022-04-17 (×3): 20 meq via ORAL
  Filled 2022-04-14 (×3): qty 1

## 2022-04-14 MED ORDER — APIXABAN 5 MG PO TABS
5.0000 mg | ORAL_TABLET | Freq: Two times a day (BID) | ORAL | Status: DC
Start: 2022-04-14 — End: 2022-04-16
  Administered 2022-04-14 – 2022-04-16 (×4): 5 mg via ORAL
  Filled 2022-04-14 (×4): qty 1

## 2022-04-14 MED ORDER — MAGNESIUM SULFATE 2 GM/50ML IV SOLN
2.0000 g | Freq: Once | INTRAVENOUS | Status: AC
Start: 2022-04-14 — End: 2022-04-14
  Administered 2022-04-14: 2 g via INTRAVENOUS
  Filled 2022-04-14: qty 50

## 2022-04-14 MED ORDER — METHYLPREDNISOLONE SODIUM SUCC 125 MG IJ SOLR
60.0000 mg | Freq: Two times a day (BID) | INTRAMUSCULAR | Status: DC
  Administered 2022-04-14: 60 mg via INTRAVENOUS
  Filled 2022-04-14: qty 2

## 2022-04-14 MED ORDER — METOPROLOL TARTRATE 25 MG PO TABS
25.0000 mg | ORAL_TABLET | Freq: Two times a day (BID) | ORAL | Status: DC
  Administered 2022-04-14 – 2022-04-17 (×7): 25 mg via ORAL
  Filled 2022-04-14 (×8): qty 1

## 2022-04-14 MED ORDER — TIOTROPIUM BROMIDE MONOHYDRATE 18 MCG IN CAPS: 18.0000 ug | ORAL_CAPSULE | Freq: Every day | RESPIRATORY_TRACT | Status: DC

## 2022-04-14 MED ORDER — REVEFENACIN 175 MCG/3ML IN SOLN
175.0000 ug | Freq: Every day | RESPIRATORY_TRACT | Status: DC
  Administered 2022-04-14 – 2022-04-22 (×9): 175 ug via RESPIRATORY_TRACT
  Filled 2022-04-14 (×14): qty 3

## 2022-04-14 MED ORDER — MOMETASONE FURO-FORMOTEROL FUM 200-5 MCG/ACT IN AERO: 2.0000 | INHALATION_SPRAY | Freq: Two times a day (BID) | RESPIRATORY_TRACT | Status: DC

## 2022-04-14 NOTE — Plan of Care (Signed)

## 2022-04-14 NOTE — ED Notes (Signed)
EDP at [facility] 

## 2022-04-14 NOTE — TOC Initial Note (Signed)
Transition of Care [facility]) - Initial/Assessment Note    Patient Details  Name: Edward [name] MRN: [id number] Date of Birth: [birth year]  Transition of [facility]) CM/SW Contact:    Ihor Gully, LCSW Phone Number: 04/14/2022, 2:09 PM  Clinical Narrative:                 Patient recently assessed on 6/13. At that time he was ordered a 3n1 and a RW and referred to [facility]. Not currently active with [facility] per rep.   Expected Discharge Plan: [place] Barriers to Discharge: Continued Medical Work up   Patient Goals and CMS Choice        Expected Discharge Plan and Services Expected Discharge Plan: [place]                                              Prior Living Arrangements/Services                       Activities of Daily Living      Permission Sought/Granted                  Emotional Assessment              Admission diagnosis:  COPD with acute exacerbation ([place]) [J44.1] Patient Active Problem List   Diagnosis Date Noted   Septic shock ([place]) 03/10/2022   Rib pain on right side 03/30/2021   Sepsis due to methicillin resistant Staphylococcus aureus (MRSA) ([place]) 04/20/2020   MRSA bacteremia 04/16/2020   AKI (acute kidney injury) ([place]) 57/10/7791   Acute metabolic encephalopathy 90/30/0923   Altered mental status 03/23/2020   Normocytic anemia 03/23/2020   Mixed hyperlipidemia 03/06/2019   Parotitis 11/01/2018   Syncope 10/30/2018   Facial cellulitis 10/30/2018   Chronic respiratory failure with hypoxia ([place]) 10/30/2018   Chronic diastolic heart failure ([name])    Hypoxia    Atrial fibrillation ([place])    COPD with acute exacerbation ([place]) 07/31/2018   Hyponatremia 07/31/2018   COPD (chronic obstructive pulmonary disease) ([place]) 05/21/2018   Stage 3 chronic kidney disease ([place]) 30/04/6225   Diastolic congestive heart failure ([place]) 05/21/2018   Type 2 diabetes mellitus ([place]) 05/21/2018    HTN (hypertension) 05/21/2018   Iron deficiency anemia due to chronic blood loss 04/30/2018   Respiratory distress    Hematochezia    Goals of care, counseling/discussion    Palliative care by specialist    DNR (do not resuscitate) discussion    Acute renal failure with acute tubular necrosis superimposed on stage 3 chronic kidney disease ([place])    Palliative care encounter    COPD exacerbation ([place]) 04/17/2018   Acute on chronic diastolic CHF (congestive heart failure) ([name])    Severe anemia 02/17/2018   Bradycardia 02/17/2018   Hypothermia    Gastroesophageal reflux disease    Acute encephalopathy 09/25/2017   On home O2 09/25/2017   Bilateral lower extremity edema 06/27/2017   Insomnia 03/28/2017   CKD (chronic kidney disease), stage III ([place]) 03/07/2017   AF (paroxysmal atrial fibrillation) ([place]) 03/05/2017   Chronic diastolic CHF (congestive heart failure) ([place]) 03/04/2017   Community acquired pneumonia of left upper lobe of lung 03/04/2017   Atrial fibrillation with rapid ventricular response ([place])    Sepsis due to pneumonia ([place]) 02/25/2017  Constipation 02/25/2017   Overflow diarrhea/Constipation 02/25/2017   DM type 2 causing vascular disease ([place]) 10/11/2016   Non compliance w medication regimen 12/02/2015   Hypercholesterolemia 05/12/2014   Acute and chronic respiratory failure with hypoxia ([place]) 11/17/2013   Elevated PSA 01/20/2013   Hypertension    Elevated lipids    Pulmonary fibrosis ([place])    Colon polyps    Colon polyps    PCP:  Susy Frizzle, MD Pharmacy:   [place], [place] [address] [place] [address] [place] [address] Phone: [phone] Fax: [place], [place] [address] [place] [address] [place] [address] Phone: [phone] Fax: [place], [place] [address] [place] [address] Phone: [phone] Fax: [phone]     Social Determinants of Health (SDOH) Interventions    Readmission Risk Interventions    04/20/2020    2:57 PM 04/19/2020    4:27 PM 04/19/2020   11:18 AM  Readmission Risk Prevention Plan  Transportation Screening   Complete  Medication Review (RN Care Manager)   Complete  PCP or Specialist appointment within 3-5 days of discharge Not Complete    PCP/Specialist Appt Not Complete comments appointment is scheduled for 04/26/20 which is six days. Labs to are to be drawn every Monday per attending request.    [place] or [place]  Complete   SW Recovery Care/Counseling Consult   Complete  Palliative Care Screening   Not Applicable

## 2022-04-14 NOTE — H&P (Signed)
History and Physical    Edward [name] IHK:[id number] DOB: 03-Jan-1935 DOA: 04/14/2022  PCP: Susy Frizzle, MD   Patient coming from: Home  Chief Complaint: Dyspnea  HPI: Edward [name] is a 87 y.o. male with medical history significant for COPD and chronic hypoxemic respiratory failure on 3 L nasal cannula who was recently discharged on 6/13 after treatment for acute COPD exacerbation and metabolic encephalopathy in the setting of pneumonia.  He was discharged with a regimen of oral antibiotics and steroid taper, but it does not appear that he actually picked up these medications.  He states that he has had some persistent dyspnea that has been worsening along with a nonproductive cough for the last 2-3 weeks.  He denies any sick contacts, fevers, or chills.  He has tried nebulizer breathing treatments at home and has not had any relief.   ED Course: Stable vital signs noted and patient is afebrile.  Hemoglobin 11.9 and sodium 133.  Blood glucose 231.  CT chest with findings of persistent left upper lobe pneumonia noted.  He has received continuous breathing treatment in ED as well as IV steroids with some improvement in his breathing.  He has been started empirically on vancomycin and cefepime for HCAP coverage.  Review of Systems: Reviewed as noted above, otherwise negative.  Past Medical History:  Diagnosis Date   Allergy    Rhinitis   Atrial fibrillation ([name])    Bronchitis    Chronic respiratory failure ([name])    Colon polyps    COPD (chronic obstructive pulmonary disease) ([place])    Diabetes mellitus    Hypercholesterolemia    Hypertension    Iron deficiency anemia due to chronic blood loss 04/30/2018   Noncompliance    On home O2    2L N/C    PSA elevation    Pulmonary fibrosis ([name])    Vitamin D deficiency     Past Surgical History:  Procedure Laterality Date   BIOPSY  04/23/2018   Procedure: BIOPSY;  Surgeon: Danie Binder, MD;  Location: [facility];  Service:  Endoscopy;;  duodenum gastric   CATARACT EXTRACTION W/PHACO  06/25/2012   Procedure: CATARACT EXTRACTION PHACO AND INTRAOCULAR LENS PLACEMENT ([place]);  Surgeon: Elta Guadeloupe T. Gershon Crane, MD;  Location: [facility];  Service: Ophthalmology;  Laterality: Left;  CDE=19.01   CATARACT EXTRACTION W/PHACO  07/09/2012   Procedure: CATARACT EXTRACTION PHACO AND INTRAOCULAR LENS PLACEMENT ([facility]);  Surgeon: Elta Guadeloupe T. Gershon Crane, MD;  Location: [facility];  Service: Ophthalmology;  Laterality: Right;  CDE: 20.09   COLONOSCOPY WITH PROPOFOL N/A 04/23/2018   Procedure: COLONOSCOPY WITH PROPOFOL;  Surgeon: Danie Binder, MD;  Location: [facility];  Service: Endoscopy;  Laterality: N/A;   ESOPHAGOGASTRODUODENOSCOPY (EGD) WITH PROPOFOL N/A 04/23/2018   Procedure: ESOPHAGOGASTRODUODENOSCOPY (EGD) WITH PROPOFOL;  Surgeon: Danie Binder, MD;  Location: [facility];  Service: Endoscopy;  Laterality: N/A;     reports that he quit smoking about 9 years ago. His smoking use included cigarettes. He has a 90.00 pack-year smoking history. He has never used smokeless tobacco. He reports that he does not drink alcohol and does not use drugs.  Allergies  Allergen Reactions   Ace Inhibitors Other (See Comments)    Hyperkalemia--07/23/2013:patient states not familiar with the following allergy    Family History  Problem Relation Age of Onset   Heart disease Mother    CAD Other    Diabetes Other     Prior to Admission medications  Medication Sig Start Date End Date Taking? Authorizing Provider  albuterol (PROVENTIL) (2.5 MG/3ML) 0.083% nebulizer solution INHALE 3ML BY NEBULIZATION ROUTE EVERY 6 HOURS AS NEEDED FOR WHEEZING OR SHORTNESS OF BREATH Patient taking differently: Take 2.5 mg by nebulization every 6 (six) hours as needed for shortness of breath. 12/09/21  Yes Susy Frizzle, MD  apixaban (ELIQUIS) 2.5 MG TABS tablet Take 1 tablet (2.5 mg total) by mouth 2 (two) times daily. Patient taking differently: Take 5 mg by mouth 2  (two) times daily. 03/14/22  Yes Ghimire, Henreitta Leber, MD  cloNIDine (CATAPRES) 0.1 MG tablet Take 0.1 mg by mouth 2 (two) times daily. 04/01/22  Yes [provider]  fluticasone-salmeterol (WIXELA INHUB) 500-50 MCG/ACT AEPB Inhale 1 puff into the lungs in the morning and at bedtime. This replaces symbicort, 03/24/22  Yes [name], [name], MD  furosemide (LASIX) 40 MG tablet TAKE 1 TABLET BY MOUTH DAILY 04/13/22  Yes Susy Frizzle, MD  insulin glargine (LANTUS SOLOSTAR) 100 UNIT/ML Solostar Pen Inject 15 Units into the skin at bedtime. Patient taking differently: Inject 15 Units into the skin See admin instructions. If sugar is elevated can go as high as 30 units at bedtime 03/14/22  Yes Ghimire, Henreitta Leber, MD  metoprolol tartrate (LOPRESSOR) 25 MG tablet TAKE 1 TABLET BY MOUTH TWICE DAILY Patient taking differently: Take 25 mg by mouth 2 (two) times daily. 10/18/21  Yes Susy Frizzle, MD  OXYGEN Inhale 3 L into the lungs continuous.    Yes [provider]  pioglitazone (ACTOS) 30 MG tablet Take 1 tablet (30 mg total) by mouth daily. 03/14/22  Yes Ghimire, Henreitta Leber, MD  potassium chloride SA (KLOR-CON) 20 MEQ tablet Take 1 tablet by mouth once daily 03/01/21  Yes [name], [name], MD  pravastatin (PRAVACHOL) 80 MG tablet TAKE 1 TABLET BY MOUTH AT BEDTIME Patient taking differently: Take 80 mg by mouth at bedtime. 01/04/22  Yes Susy Frizzle, MD  pregabalin (LYRICA) 100 MG capsule Take 1 capsule (100 mg total) by mouth 2 (two) times daily. 03/24/22  Yes Susy Frizzle, MD  tiotropium (SPIRIVA HANDIHALER) 18 MCG inhalation capsule Place 1 capsule (18 mcg total) into inhaler and inhale daily. 03/24/22 03/24/23 Yes PickardCammie Mcgee, MD  glucose blood (ONETOUCH ULTRA) test strip CHECK FASTING BLOOD SUGAR TWICE DAILY 03/10/22   Susy Frizzle, MD  Lancets [facility] DELICA PLUS [id number]) MISC USE TO CHECK BLOOD SUGAR TWICE DAILY AS DIRECTED 12/08/20   Susy Frizzle, MD     Physical Exam: Vitals:   04/14/22 0745 04/14/22 0800 04/14/22 1014 04/14/22 1015  BP: (!) 156/81 (!) 151/73  (!) 141/69  Pulse: 72 70  74  Resp: (!) 21 15  (!) 31  Temp:   97.7 F (36.5 C)   SpO2: 100% 100%  100%  Weight:      Height:        Constitutional: NAD, calm, comfortable Vitals:   04/14/22 0745 04/14/22 0800 04/14/22 1014 04/14/22 1015  BP: (!) 156/81 (!) 151/73  (!) 141/69  Pulse: 72 70  74  Resp: (!) 21 15  (!) 31  Temp:   97.7 F (36.5 C)   SpO2: 100% 100%  100%  Weight:      Height:       Eyes: lids and conjunctivae normal Neck: normal, supple Respiratory: Bilateral wheezing noted on currently on 3 L nasal cannula Cardiovascular: Regular rate and rhythm, no murmurs. Abdomen: no tenderness, no distention.  Bowel sounds positive.  Musculoskeletal:  No edema. Skin: no rashes, lesions, ulcers.  Psychiatric: Flat affect  Labs on Admission: I have personally reviewed following labs and imaging studies  CBC: Recent Labs  Lab 04/14/22 0743  WBC 7.0  NEUTROABS 4.3  HGB 11.9*  HCT 37.1*  MCV 87.3  PLT 384   Basic Metabolic Panel: Recent Labs  Lab 04/14/22 0743  NA 133*  K 3.9  CL 100  CO2 26  GLUCOSE 231*  BUN 40*  CREATININE 1.13  CALCIUM 8.8*   GFR: Estimated Creatinine Clearance: 48.7 mL/min (by C-G formula based on SCr of 1.13 mg/dL). Liver Function Tests: Recent Labs  Lab 04/14/22 0743  AST 15  ALT 12  ALKPHOS 74  BILITOT 0.3  PROT 7.4  ALBUMIN 3.3*   No results for input(s): "LIPASE", "AMYLASE" in the last 168 hours. No results for input(s): "AMMONIA" in the last 168 hours. Coagulation Profile: No results for input(s): "INR", "PROTIME" in the last 168 hours. Cardiac Enzymes: No results for input(s): "CKTOTAL", "CKMB", "CKMBINDEX", "TROPONINI" in the last 168 hours. BNP (last 3 results) No results for input(s): "PROBNP" in the last 8760 hours. HbA1C: No results for input(s): "HGBA1C" in the last 72 hours. CBG: Recent  Labs  Lab 04/14/22 0800  GLUCAP 241*   Lipid Profile: No results for input(s): "CHOL", "HDL", "LDLCALC", "TRIG", "CHOLHDL", "LDLDIRECT" in the last 72 hours. Thyroid Function Tests: No results for input(s): "TSH", "T4TOTAL", "FREET4", "T3FREE", "THYROIDAB" in the last 72 hours. Anemia Panel: No results for input(s): "VITAMINB12", "FOLATE", "FERRITIN", "TIBC", "IRON", "RETICCTPCT" in the last 72 hours. Urine analysis:    Component Value Date/Time   COLORURINE YELLOW 03/10/2022 1000   APPEARANCEUR CLOUDY (A) 03/10/2022 1000   LABSPEC 1.013 03/10/2022 1000   PHURINE 5.0 03/10/2022 1000   GLUCOSEU >=500 (A) 03/10/2022 1000   HGBUR SMALL (A) 03/10/2022 1000   BILIRUBINUR NEGATIVE 03/10/2022 1000   KETONESUR NEGATIVE 03/10/2022 1000   PROTEINUR >=300 (A) 03/10/2022 1000   NITRITE NEGATIVE 03/10/2022 1000   LEUKOCYTESUR NEGATIVE 03/10/2022 1000    Radiological Exams on Admission: CT Chest W Contrast  Result Date: 04/14/2022 CLINICAL DATA:  Shortness of breath for 2 weeks. Abnormal chest x-ray with persistent left lung opacities. Evaluate for soft tissue mass. EXAM: CT CHEST WITH CONTRAST TECHNIQUE: Multidetector CT imaging of the chest was performed during intravenous contrast administration. RADIATION DOSE REDUCTION: This exam was performed according to the departmental dose-optimization program which includes automated exposure control, adjustment of the mA and/or kV according to patient size and/or use of iterative reconstruction technique. CONTRAST:  12m OMNIPAQUE IOHEXOL 300 MG/ML  SOLN COMPARISON:  Multiple radiographs, most recently 04/14/2022 and 03/20/2022. CT 02/28/2017. FINDINGS: Cardiovascular: Diffuse atherosclerosis of the aorta, great vessels and coronary arteries. No acute vascular findings are evident. There are calcifications of the aortic valve. The heart size is normal. There is no pericardial effusion. Mediastinum/Nodes: There are no enlarged mediastinal, hilar or  axillary lymph nodes.Mildly prominent left infrahilar lymph nodes, likely reactive. The thyroid gland, trachea and esophagus demonstrate no significant findings. Lungs/Pleura: Trace left pleural effusion. No right pleural effusion or pneumothorax. Underlying moderate centrilobular and paraseptal emphysema. On the remote prior chest CT, there were extensive right upper and bilateral lower lobe infiltrates which have resolved. Mild residual right lung scarring is noted. There is focal airspace disease peripherally and anteriorly in the left upper lobe, as seen on recent radiographs. These opacities were first seen 03/10/2022 and are new from 02/07/2022, and therefore  consistent with pneumonia. In review of prior radiographs, there appears to be mild improvement from 03/10/2022. There is some associated central airway thickening and luminal narrowing, and as above, probable reactive left hilar adenopathy. No focal lung mass identified to suggest malignancy. Upper abdomen: The visualized upper abdomen appears stable without acute findings. There is fatty replacement of the pancreas and aortic atherosclerosis. Musculoskeletal/Chest wall: There is no chest wall mass or suspicious osseous finding. Bilateral gynecomastia, similar to previous study. Multilevel spondylosis. IMPRESSION: 1. Persistent left upper lobe airspace disease, new from radiographs of 02/07/2022, but incompletely resolved. Findings remain most consistent with pneumonia. Continued radiographic follow-up in 4-6 weeks recommended. 2. No focal lung mass identified to suggest malignancy. There is left hilar adenopathy which is nonspecific, although likely reactive. Mild associated central airway thickening and luminal narrowing. 3. Coronary and aortic atherosclerosis (ICD10-I70.0). Emphysema (ICD10-J43.9). Electronically Signed   By: Richardean Sale M.D.   On: 04/14/2022 09:24   DG Chest 2 View  Result Date: 04/14/2022 CLINICAL DATA:  SOB EXAM: CHEST - 2  VIEW COMPARISON:  Chest x-ray June 19, 23. FINDINGS: Slightly progressed interstitial and airspace opacities in the left mid and upper lung. No visible pleural effusions pneumothorax. Similar cardiomediastinal silhouette. No acute osseous abnormality. IMPRESSION: Slightly progressed interstitial and airspace opacities in the left mid and upper lung, concerning for pneumonia. Given persistence, recommend CT chest (preferably with contrast) to further evaluate and exclude alternative etiologies such as malignancy. Electronically Signed   By: Margaretha Sheffield M.D.   On: 04/14/2022 08:31    EKG: Independently reviewed.  Sinus rhythm 74 bpm  Assessment/Plan Principal Problem:   COPD with acute exacerbation ([name]) Active Problems:   Hypertension   DM type 2 causing vascular disease ([name])   Chronic diastolic CHF (congestive heart failure) ([name])   AF (paroxysmal atrial fibrillation) ([name])   Hyponatremia    Acute COPD exacerbation with concern for persistent pneumonia -Continue IV Solu-Medrol twice daily as ordered -DuoNebs as needed for shortness of breath or wheezing -Antitussives -Continue vancomycin and cefepime empirically for HCAP and check procalcitonin -Follow lab work -Currently on baseline 3 L nasal cannula oxygen  History of paroxysmal atrial fibrillation -Continue Eliquis at home and metoprolol for heart rate control  Chronic diastolic CHF -2D echocardiogram 1 month ago with EF 60-65% and grade 2 diastolic dysfunction  CKD stage IIIb -Continue to monitor  Mild hyponatremia -Appears to be a chronic issue, continue to monitor  Normocytic anemia -Continue to monitor  Hypertension -Continue home medications and monitor  Type 2 diabetes -A1c 13.6% on 6/9 -SSI coverage especially with steroids  Deconditioning/debility -Home health PT  DVT prophylaxis: Eliquis Code Status: Full Family Communication: None at bedside Disposition Plan:Tx for COPD/PNA Consults called:  Pulmonology Admission status: Inpatient, telemetry  Severity of Illness: The appropriate patient status for this patient is INPATIENT. Inpatient status is judged to be reasonable and necessary in order to provide the required intensity of service to ensure the patient's safety. The patient's presenting symptoms, physical exam findings, and initial radiographic and laboratory data in the context of their chronic comorbidities is felt to place them at high risk for further clinical deterioration. Furthermore, it is not anticipated that the patient will be medically stable for discharge from the hospital within 2 midnights of admission.   * I certify that at the point of admission it is my clinical judgment that the patient will require inpatient hospital care spanning beyond 2 midnights from the point of admission due to  high intensity of service, high risk for further deterioration and high frequency of surveillance required.*   Edward [name] D Christiann Hagerty DO [facility]  If 7PM-7AM, please contact night-coverage [email or web address]  04/14/2022, 10:52 AM

## 2022-04-14 NOTE — Progress Notes (Signed)
Pharmacy Antibiotic Note  Edward [name] a 87 y.o. male admitted on 04/14/2022 with sepsis.  Pharmacy has been consulted for vancomycin and cefepime dosing.  Plan: Vancomycin '750mg'$  IV every 12 hours.  Goal trough 15-20 mcg/mL. Cefepime 2gm IV every 12 hours.  Medical History: Past Medical History:  Diagnosis Date   Allergy    Rhinitis   Atrial fibrillation ([name])    Bronchitis    Chronic respiratory failure ([name])    Colon polyps    COPD (chronic obstructive pulmonary disease) ([place])    Diabetes mellitus    Hypercholesterolemia    Hypertension    Iron deficiency anemia due to chronic blood loss 04/30/2018   Noncompliance    On home O2    2L N/C    PSA elevation    Pulmonary fibrosis ([name])    Vitamin D deficiency     Allergies:  Allergies  Allergen Reactions   Ace Inhibitors Other (See Comments)    Hyperkalemia--07/23/2013:patient states not familiar with the following allergy    Filed Weights   04/14/22 0727  Weight: 72.6 kg (160 lb)       Latest Ref Rng & Units 04/14/2022    7:43 AM 03/24/2022   11:28 AM 03/20/2022    5:28 PM  CBC  WBC 4.0 - 10.5 K/uL 7.0  8.9  11.0   Hemoglobin 13.0 - 17.0 g/dL 11.9  11.4  11.9   Hematocrit 39.0 - 52.0 % 37.1  35.9  36.8   Platelets 150 - 400 K/uL 272  464  457      Estimated Creatinine Clearance: 48.7 mL/min (by C-G formula based on SCr of 1.13 mg/dL).  Antibiotics Given (last 72 hours)     Date/Time Action Medication Dose Rate   04/14/22 1036 New Bag/Given   ceFEPIme (MAXIPIME) 2 g in sodium chloride 0.9 % 100 mL IVPB 2 g 200 mL/hr       Antimicrobials this admission:  Cefepime 04/14/2022  >>  vancomycin 04/14/2022  >>   Microbiology results: 04/14/2022  BCx: sent 04/14/2022  UCx: sent 04/14/2022  Resp Panel: sent  04/14/2022  MRSA PCR: sent  Thank you for allowing pharmacy to be a part of this patient's care.  Thomasenia Sales, PharmD Clinical Pharmacist

## 2022-04-14 NOTE — Consult Note (Signed)
[facility]   Patient name: Edward [name] Admit date: 04/14/2022  DOB: 14-Jul-1935 LOS: 0  MRN: [id number] Consult date: 04/14/2022  Referring provider: Dr. [name] CC: Short of breath    History:  87 yo male former smoker with hx of COPD and chronic respiratory failure on 3 liters was in hospital in June 2023 for community acquired pneumonia, A fib with RVR, and acute metabolic encephalopathy.  He was sent home with antibiotics and steroid taper, but there was concern whether he picked up his prescriptions.  He had progressive cough and dyspnea, and returned to [facility] on 04/14/22.  He was started on antibiotics for pneumonia and treatment for COPD exacerbation.  PCCM consulted to assist with respiratory management.  Past medical history:  Allergies, Atrial fibrillation, Colon polyps, DM type 2, HLD, HTN, IDA, Vit D deficiency  Significant events:  7/14 Admit  Studies:  Echo 03/12/22 >> EF 60 to 65%, grade 2 DD, mild elevation in PASP CT chest 04/14/22 >> atherosclerosis, trace left effusion, moderate centrilobular and paraseptal emphysema, Rt lung scarring, focal ASD in LUL with improvement compared to 03/10/22  Micro:    Lines:     Antibiotics:  Cefepime 7/14 >> Vancomycin 7/14 >>   Consults:      Interim history:  He is hoping he doesn't have to stay in the hospital too long.  He has dry cough now.  Gets wheezing sometimes.  Food comes up again after he eats.  Not having chest pain, sinus congestion or leg swelling.  Vital signs:  BP (!) 141/69   Pulse 74   Temp 97.7 F (36.5 C)   Resp (!) 31   Ht '5\' 9"'$  (1.753 m)   Wt 72.6 kg   SpO2 100%   BMI 23.63 kg/m   Intake/output:  No intake/output data recorded.   Physical exam:   General - alert Eyes - pupils reactive ENT - no sinus tenderness, no stridor, edentulous Cardiac - regular rate/rhythm, no murmur Chest - decreased BS, scattered rhonchi, no  wheeze Abdomen - soft, non tender, + bowel sounds Extremities - no cyanosis, clubbing, or edema Skin - no rashes Neuro - normal strength, moves extremities, follows commands Psych - normal mood and behavior  Best practice:   DVT - Eliquis SUP - N/A Nutrition - heart healthy/carb modified   Assessment/plan:   Acute on chronic hypoxic respiratory failure. - likely from slow to resolve pneumonia in setting of COPD exacerbation - antibiotics per primary team - follow up chest xray intermittently - adjust oxygen to keep SpO2 90 to 95% - will change to yupelri, pulmicort, brovana while in hospital - prn albuterol - continue solumedrol 60 mg q12h for now  Paroxysmal atrial fibrillation. Chronic diastolic CHF. CKD 3b. Anemia of chronic disease. DM type 2 with peripheral neuropathy. - per primary team  Resolved hospital problems:    Goals of care/Family discussions:  Code status: full code; might benefit from palliative care assessment.  Labs:      Latest Ref Rng & Units 04/14/2022    7:43 AM 03/24/2022   11:28 AM 03/20/2022    5:28 PM  CMP  Glucose 70 - 99 mg/dL 231  280  422   BUN 8 - 23 mg/dL 40  29  45   Creatinine 0.61 - 1.24 mg/dL 1.13  1.35  1.37   Sodium 135 - 145 mmol/L 133  132  130   Potassium 3.5 - 5.1 mmol/L 3.9  5.1  4.6   Chloride 98 - 111 mmol/L 100  96  96   CO2 22 - 32 mmol/L '26  28  26   '$ Calcium 8.9 - 10.3 mg/dL 8.8  8.2  8.2   Total Protein 6.5 - 8.1 g/dL 7.4  5.6  6.5   Total Bilirubin 0.3 - 1.2 mg/dL 0.3  0.2  0.2   Alkaline Phos 38 - 126 U/L 74   103   AST 15 - 41 U/L '15  8  13   '$ ALT 0 - 44 U/L '12  9  17        '$ Latest Ref Rng & Units 04/14/2022    7:43 AM 03/24/2022   11:28 AM 03/20/2022    5:28 PM  CBC  WBC 4.0 - 10.5 K/uL 7.0  8.9  11.0   Hemoglobin 13.0 - 17.0 g/dL 11.9  11.4  11.9   Hematocrit 39.0 - 52.0 % 37.1  35.9  36.8   Platelets 150 - 400 K/uL 272  464  457     ABG    Component Value Date/Time   PHART 7.400 03/10/2022 1315    PCO2ART 45.7 03/10/2022 1315   PO2ART 74 (L) 03/10/2022 1315   HCO3 27.6 04/14/2022 0800   TCO2 30 03/10/2022 1315   ACIDBASEDEF 1.0 04/14/2022 0800   O2SAT 63.5 04/14/2022 0800    CBG (last 3)  Recent Labs    04/14/22 0800 04/14/22 1141  GLUCAP 241* 217*     Past surgical history:  He  has a past surgical history that includes Cataract extraction w/PHACO (06/25/2012); Cataract extraction w/PHACO (07/09/2012); Colonoscopy with propofol (N/A, 04/23/2018); Esophagogastroduodenoscopy (egd) with propofol (N/A, 04/23/2018); and biopsy (04/23/2018).  Social history:  He  reports that he quit smoking about 9 years ago. His smoking use included cigarettes. He has a 90.00 pack-year smoking history. He has never used smokeless tobacco. He reports that he does not drink alcohol and does not use drugs.   Review of systems:  Reviewed and negative  Family history:  His family history includes CAD in an other family member; Diabetes in an other family member; Heart disease in his mother.    Medications:   No current facility-administered medications on file prior to encounter.   Current Outpatient Medications on File Prior to Encounter  Medication Sig   albuterol (PROVENTIL) (2.5 MG/3ML) 0.083% nebulizer solution INHALE 3ML BY NEBULIZATION ROUTE EVERY 6 HOURS AS NEEDED FOR WHEEZING OR SHORTNESS OF BREATH (Patient taking differently: Take 2.5 mg by nebulization every 6 (six) hours as needed for shortness of breath.)   apixaban (ELIQUIS) 2.5 MG TABS tablet Take 1 tablet (2.5 mg total) by mouth 2 (two) times daily. (Patient taking differently: Take 5 mg by mouth 2 (two) times daily.)   cloNIDine (CATAPRES) 0.1 MG tablet Take 0.1 mg by mouth 2 (two) times daily.   fluticasone-salmeterol (WIXELA INHUB) 500-50 MCG/ACT AEPB Inhale 1 puff into the lungs in the morning and at bedtime. This replaces symbicort,   furosemide (LASIX) 40 MG tablet TAKE 1 TABLET BY MOUTH DAILY   insulin glargine (LANTUS  SOLOSTAR) 100 UNIT/ML Solostar Pen Inject 15 Units into the skin at bedtime. (Patient taking differently: Inject 15 Units into the skin See admin instructions. If sugar is elevated can go as high as 30 units at bedtime)   metoprolol tartrate (LOPRESSOR) 25 MG tablet TAKE 1 TABLET BY MOUTH TWICE DAILY (Patient taking differently: Take 25 mg by mouth 2 (two) times daily.)  OXYGEN Inhale 3 L into the lungs continuous.    pioglitazone (ACTOS) 30 MG tablet Take 1 tablet (30 mg total) by mouth daily.   potassium chloride SA (KLOR-CON) 20 MEQ tablet Take 1 tablet by mouth once daily   pravastatin (PRAVACHOL) 80 MG tablet TAKE 1 TABLET BY MOUTH AT BEDTIME (Patient taking differently: Take 80 mg by mouth at bedtime.)   pregabalin (LYRICA) 100 MG capsule Take 1 capsule (100 mg total) by mouth 2 (two) times daily.   tiotropium (SPIRIVA HANDIHALER) 18 MCG inhalation capsule Place 1 capsule (18 mcg total) into inhaler and inhale daily.   glucose blood (ONETOUCH ULTRA) test strip CHECK FASTING BLOOD SUGAR TWICE DAILY   Lancets (ONETOUCH DELICA PLUS [id number]) MISC USE TO CHECK BLOOD SUGAR TWICE DAILY AS DIRECTED     Signature:  Chesley Mires, MD [place] Pager - [phone] 04/14/2022, 2:11 PM

## 2022-04-14 NOTE — ED Notes (Signed)
No changes. Pt to xray. Resting, NAD, calm, alert. Family arrives to [facility].

## 2022-04-14 NOTE — ED Notes (Signed)
Patient transported to CT 

## 2022-04-14 NOTE — ED Provider Notes (Signed)
[facility] EMERGENCY DEPARTMENT Provider Note   CSN: [id number] Arrival date & time: 04/14/22  4235     History  Chief Complaint  Patient presents with   Shortness of Breath    Edward [name] is a 87 y.o. male presenting to the ED with complaint of shortness of breath.  The patient does have a history of emphysema and COPD and wears 3 L nasal cannula at baseline.  He reports his breathing has been getting progressively worse the past few days.  His son brought him into the ED today.  The patient does have a nebulizer machine and says he took 4-5 treatments the past 24 hours, and has not had a lot of relief.  He denies any lower extremity leg swelling.  Medical chart review shows last echocardiogram performed 1 month ago, with an EF of 60 to 36%, grade 2 diastolic dysfunction, mildly elevated pulmonary artery systolic pressures, mildly dilated left atrium, trivial mitral valve regurg, and mild calcification of the aortic valve.  He was hospitalized for acute on chronic hypoxemic respiratory failure and acute metabolic encephalopathy secondary to pneumonia and COPD exacerbation 1 month ago in June 2023.  His COVID was negative and his blood cultures were no growth to date at that time.  He did have a left upper lobe pneumonia which was treated.  He required ICU admission due to severe encephalopathy.  He was also noted to be intermittently in A-fib with RVR, back to sinus rhythm at discharge, and was maintained on Eliquis and metoprolol.  There is note that he has a poorly controlled type II diabetic, the patient is on insulin at home.  HPI     Home Medications Prior to Admission medications   Medication Sig Start Date End Date Taking? Authorizing Provider  albuterol (PROVENTIL) (2.5 MG/3ML) 0.083% nebulizer solution INHALE 3ML BY NEBULIZATION ROUTE EVERY 6 HOURS AS NEEDED FOR WHEEZING OR SHORTNESS OF BREATH Patient taking differently: Take 2.5 mg by nebulization every 6 (six) hours as  needed for shortness of breath. 12/09/21  Yes Susy Frizzle, MD  apixaban (ELIQUIS) 2.5 MG TABS tablet Take 1 tablet (2.5 mg total) by mouth 2 (two) times daily. Patient taking differently: Take 5 mg by mouth 2 (two) times daily. 03/14/22  Yes Ghimire, Henreitta Leber, MD  fluticasone-salmeterol (WIXELA INHUB) 500-50 MCG/ACT AEPB Inhale 1 puff into the lungs in the morning and at bedtime. This replaces symbicort, 03/24/22  Yes [name], [name], MD  furosemide (LASIX) 40 MG tablet TAKE 1 TABLET BY MOUTH DAILY 04/13/22  Yes Susy Frizzle, MD  insulin glargine (LANTUS SOLOSTAR) 100 UNIT/ML Solostar Pen Inject 15 Units into the skin at bedtime. Patient taking differently: Inject 15 Units into the skin See admin instructions. If sugar is elevated can go as high as 30 units at bedtime 03/14/22  Yes Ghimire, Henreitta Leber, MD  metoprolol tartrate (LOPRESSOR) 25 MG tablet TAKE 1 TABLET BY MOUTH TWICE DAILY Patient taking differently: Take 25 mg by mouth 2 (two) times daily. 10/18/21  Yes Susy Frizzle, MD  OXYGEN Inhale 3 L into the lungs continuous.    Yes [provider]  pioglitazone (ACTOS) 30 MG tablet Take 1 tablet (30 mg total) by mouth daily. 03/14/22  Yes Ghimire, Henreitta Leber, MD  potassium chloride SA (KLOR-CON) 20 MEQ tablet Take 1 tablet by mouth once daily 03/01/21  Yes [name], [name], MD  pravastatin (PRAVACHOL) 80 MG tablet TAKE 1 TABLET BY MOUTH AT BEDTIME Patient taking  differently: Take 80 mg by mouth at bedtime. 01/04/22  Yes Susy Frizzle, MD  pregabalin (LYRICA) 100 MG capsule Take 1 capsule (100 mg total) by mouth 2 (two) times daily. 03/24/22  Yes Susy Frizzle, MD  tiotropium (SPIRIVA HANDIHALER) 18 MCG inhalation capsule Place 1 capsule (18 mcg total) into inhaler and inhale daily. 03/24/22 03/24/23 Yes Susy Frizzle, MD  glucose blood (ONETOUCH ULTRA) test strip CHECK FASTING BLOOD SUGAR TWICE DAILY 03/10/22   Susy Frizzle, MD  Lancets [facility] DELICA PLUS  [id number]) [place] USE TO CHECK BLOOD SUGAR TWICE DAILY AS DIRECTED 12/08/20   Susy Frizzle, MD      Allergies    Ace inhibitors    Review of Systems   Review of Systems  Physical Exam Updated Vital Signs BP (!) 141/69   Pulse 74   Temp 97.7 F (36.5 C)   Resp (!) 31   Ht '5\' 9"'$  (5.102 m)   Wt 72.6 kg   SpO2 100%   BMI 23.63 kg/m  Physical Exam Constitutional:      General: He is not in acute distress. HENT:     Head: Normocephalic and atraumatic.  Eyes:     Conjunctiva/sclera: Conjunctivae normal.     Pupils: Pupils are equal, round, and reactive to light.  Cardiovascular:     Rate and Rhythm: Normal rate and regular rhythm.  Pulmonary:     Effort: Pulmonary effort is normal. No respiratory distress.     Comments: On baseline 3L [place] Diffuse expiratory wheezing Abdominal:     General: There is no distension.     Tenderness: There is no abdominal tenderness.  Skin:    General: Skin is warm and dry.  Neurological:     General: No focal deficit present.     Mental Status: He is alert. Mental status is at baseline.  Psychiatric:        Mood and Affect: Mood normal.        Behavior: Behavior normal.     ED Results / Procedures / Treatments   Labs (all labs ordered are listed, but only abnormal results are displayed) Labs Reviewed  COMPREHENSIVE METABOLIC PANEL - Abnormal; Notable for the following components:      Result Value   Sodium 133 (*)    Glucose, Bld 231 (*)    BUN 40 (*)    Calcium 8.8 (*)    Albumin 3.3 (*)    All other components within normal limits  CBC WITH DIFFERENTIAL/PLATELET - Abnormal; Notable for the following components:   Hemoglobin 11.9 (*)    HCT 37.1 (*)    All other components within normal limits  BRAIN NATRIURETIC PEPTIDE - Abnormal; Notable for the following components:   B Natriuretic Peptide 315.0 (*)    All other components within normal limits  BLOOD GAS, VENOUS - Abnormal; Notable for the following components:   pCO2,  Ven 62 (*)    All other components within normal limits  CBG MONITORING, ED - Abnormal; Notable for the following components:   Glucose-Capillary 241 (*)    All other components within normal limits  PROCALCITONIN  TROPONIN I (HIGH SENSITIVITY)  TROPONIN I (HIGH SENSITIVITY)    EKG EKG Interpretation  Date/Time:  Friday April 14 2022 07:29:40 EDT Ventricular Rate:  74 PR Interval:  149 QRS Duration: 85 QT Interval:  413 QTC Calculation: 459 R Axis:   84 Text Interpretation: Sinus rhythm Borderline right axis deviation Borderline low voltage, extremity  leads Anteroseptal infarct, old Baseline wander in lead(s) II III aVF Confirmed by Octaviano Glow [phone]) on 04/14/2022 7:55:02 AM  Radiology CT Chest W Contrast  Result Date: 04/14/2022 CLINICAL DATA:  Shortness of breath for 2 weeks. Abnormal chest x-ray with persistent left lung opacities. Evaluate for soft tissue mass. EXAM: CT CHEST WITH CONTRAST TECHNIQUE: Multidetector CT imaging of the chest was performed during intravenous contrast administration. RADIATION DOSE REDUCTION: This exam was performed according to the departmental dose-optimization program which includes automated exposure control, adjustment of the mA and/or kV according to patient size and/or use of iterative reconstruction technique. CONTRAST:  51m OMNIPAQUE IOHEXOL 300 MG/ML  SOLN COMPARISON:  Multiple radiographs, most recently 04/14/2022 and 03/20/2022. CT 02/28/2017. FINDINGS: Cardiovascular: Diffuse atherosclerosis of the aorta, great vessels and coronary arteries. No acute vascular findings are evident. There are calcifications of the aortic valve. The heart size is normal. There is no pericardial effusion. Mediastinum/Nodes: There are no enlarged mediastinal, hilar or axillary lymph nodes.Mildly prominent left infrahilar lymph nodes, likely reactive. The thyroid gland, trachea and esophagus demonstrate no significant findings. Lungs/Pleura: Trace left pleural  effusion. No right pleural effusion or pneumothorax. Underlying moderate centrilobular and paraseptal emphysema. On the remote prior chest CT, there were extensive right upper and bilateral lower lobe infiltrates which have resolved. Mild residual right lung scarring is noted. There is focal airspace disease peripherally and anteriorly in the left upper lobe, as seen on recent radiographs. These opacities were first seen 03/10/2022 and are new from 02/07/2022, and therefore consistent with pneumonia. In review of prior radiographs, there appears to be mild improvement from 03/10/2022. There is some associated central airway thickening and luminal narrowing, and as above, probable reactive left hilar adenopathy. No focal lung mass identified to suggest malignancy. Upper abdomen: The visualized upper abdomen appears stable without acute findings. There is fatty replacement of the pancreas and aortic atherosclerosis. Musculoskeletal/Chest wall: There is no chest wall mass or suspicious osseous finding. Bilateral gynecomastia, similar to previous study. Multilevel spondylosis. IMPRESSION: 1. Persistent left upper lobe airspace disease, new from radiographs of 02/07/2022, but incompletely resolved. Findings remain most consistent with pneumonia. Continued radiographic follow-up in 4-6 weeks recommended. 2. No focal lung mass identified to suggest malignancy. There is left hilar adenopathy which is nonspecific, although likely reactive. Mild associated central airway thickening and luminal narrowing. 3. Coronary and aortic atherosclerosis (ICD10-I70.0). Emphysema (ICD10-J43.9). Electronically Signed   By: WRichardean SaleM.D.   On: 04/14/2022 09:24   DG Chest 2 View  Result Date: 04/14/2022 CLINICAL DATA:  SOB EXAM: CHEST - 2 VIEW COMPARISON:  Chest x-ray June 19, 23. FINDINGS: Slightly progressed interstitial and airspace opacities in the left mid and upper lung. No visible pleural effusions pneumothorax. Similar  cardiomediastinal silhouette. No acute osseous abnormality. IMPRESSION: Slightly progressed interstitial and airspace opacities in the left mid and upper lung, concerning for pneumonia. Given persistence, recommend CT chest (preferably with contrast) to further evaluate and exclude alternative etiologies such as malignancy. Electronically Signed   By: FMargaretha SheffieldM.D.   On: 04/14/2022 08:31    Procedures Procedures    Medications Ordered in ED Medications  albuterol (PROVENTIL,VENTOLIN) solution continuous neb (10 mg/hr Nebulization Not Given 04/14/22 0800)  ceFEPIme (MAXIPIME) 2 g in sodium chloride 0.9 % 100 mL IVPB (has no administration in time range)  vancomycin (VANCOREADY) IVPB 1500 mg/300 mL (has no administration in time range)  ipratropium-albuterol (DUONEB) 0.5-2.5 (3) MG/3ML nebulizer solution 3 mL (3 mLs Nebulization Given 04/14/22 0737)  magnesium sulfate IVPB 2 g 50 mL (0 g Intravenous Stopped 04/14/22 0953)  albuterol (PROVENTIL) (2.5 MG/3ML) 0.083% nebulizer solution (10 mg  Given 04/14/22 0828)  iohexol (OMNIPAQUE) 300 MG/ML solution 75 mL (75 mLs Intravenous Contrast Given 04/14/22 0848)  methylPREDNISolone sodium succinate (SOLU-MEDROL) 125 mg/2 mL injection 125 mg (125 mg Intravenous Given 04/14/22 1016)    ED Course/ Medical Decision Making/ A&P Clinical Course as of 04/14/22 1022  Fri Apr 14, 2022  0934 Patient overall is feeling better, however he now has more diffuse wheezing, which may be indicative of relaxation of the bronchial smooth muscles.  He continues with continuous albuterol. [MT]  1638 CT scan showing persistent pneumonia despite treatment, I would have concern for hospital-acquired pneumonia at this point.  We will broaden his coverage to include vancomycin and cefepime.  We will give him IV steroids and he will be admitted to the hospital again for pneumonia and COPD exacerbation.  His blood sugars can be watched in the hospital.  His ABG thankfully does  not show acidosis, and only mild CO2 elevation [MT]  1020 Admitted to hospitalist [MT]    Clinical Course User Index [MT] Dequita Schleicher, Carola Rhine, MD                           Medical Decision Making Amount and/or Complexity of Data Reviewed Labs: ordered. Radiology: ordered.  Risk Prescription drug management. Decision regarding hospitalization.   This patient presents to the ED with concern for shortness of breath. This involves an extensive number of treatment options, and is a complaint that carries with it a high risk of complications and morbidity.  The differential diagnosis includes COPD exacerbation versus recurring pneumonia versus anemia versus pneumothorax versus other  Co-morbidities that complicate the patient evaluation: History of emphysema, significant lung disease, on baseline oxygen, history of COPD exacerbations, raise concern for continued respiratory failure  External records from outside source obtained and reviewed including hospital discharge summary from 1 month ago.  I ordered and personally interpreted labs.  The pertinent results include: Troponin unremarkable.  White blood cell count within normal limits.  BNP mildly elevated.  CMP near baseline levels.  No acidosis on VBG  I ordered imaging studies including x-ray of the chest, CT of the I independently visualized and interpreted imaging which showed left-sided infiltrate I agree with the radiologist interpretation  The patient was maintained on a cardiac monitor.  I personally viewed and interpreted the cardiac monitored which showed an underlying rhythm of: Sinus rhythm  Per my interpretation the patient's ECG shows sinus rhythm  I ordered medication including for COPD exacerbation, IV magnesium, continuous nebs, IV steroids.  I have also ordered antibiotics for HCAP coverage with broad-spectrum IV vancomycin and IV cefepime.  I have a lower suspicion for sepsis at this time, he is afebrile, does not meet  SIRS criteria.  Do not believe any blood cultures, and I would avoid large fluid boluses with his history of heart failure.  I have reviewed the patients home medicines and have made adjustments as needed  Test Considered: Lower suspicion for acute pulmonary embolism at this time  After the interventions noted above, I reevaluated the patient and found that they have: improved  He remained stable from a respiratory standpoint, speaking in full sentences, on baseline 3 L nasal cannula.  I do not believe he is requiring BiPAP or intubation.  He will be admitted to the hospitalist. Dispostion:  After consideration of the diagnostic results and the patients response to treatment, I feel that the patent would benefit from medical admission for treatment of COPD exacerbation and pneumonia.         Final Clinical Impression(s) / ED Diagnoses Final diagnoses:  COPD exacerbation ([place])  HCAP (healthcare-associated pneumonia)    Rx / DC Orders ED Discharge Orders     None         Demetria Iwai, Carola Rhine, MD 04/14/22 1022

## 2022-04-15 DIAGNOSIS — J441 Chronic obstructive pulmonary disease with (acute) exacerbation: Secondary | ICD-10-CM | POA: Diagnosis not present

## 2022-04-15 LAB — CBC
HCT: 32.4 % — ABNORMAL LOW (ref 39.0–52.0)
Hemoglobin: 10.3 g/dL — ABNORMAL LOW (ref 13.0–17.0)
MCH: 27.9 pg (ref 26.0–34.0)
MCHC: 31.8 g/dL (ref 30.0–36.0)
MCV: 87.8 fL (ref 80.0–100.0)
Platelets: 274 10*3/uL (ref 150–400)
RBC: 3.69 MIL/uL — ABNORMAL LOW (ref 4.22–5.81)
RDW: 15.7 % — ABNORMAL HIGH (ref 11.5–15.5)
WBC: 9.4 10*3/uL (ref 4.0–10.5)
nRBC: 0 % (ref 0.0–0.2)

## 2022-04-15 LAB — BASIC METABOLIC PANEL
Anion gap: 7 (ref 5–15)
BUN: 49 mg/dL — ABNORMAL HIGH (ref 8–23)
CO2: 25 mmol/L (ref 22–32)
Calcium: 8.3 mg/dL — ABNORMAL LOW (ref 8.9–10.3)
Chloride: 103 mmol/L (ref 98–111)
Creatinine, Ser: 1.46 mg/dL — ABNORMAL HIGH (ref 0.61–1.24)
GFR, Estimated: 47 mL/min — ABNORMAL LOW (ref >60)
Glucose, Bld: 187 mg/dL — ABNORMAL HIGH (ref 70–99)
Potassium: 4.6 mmol/L (ref 3.5–5.1)
Sodium: 135 mmol/L (ref 135–145)

## 2022-04-15 LAB — MAGNESIUM: Magnesium: 2.7 mg/dL — ABNORMAL HIGH (ref 1.7–2.4)

## 2022-04-15 LAB — GLUCOSE, CAPILLARY
Glucose-Capillary: 186 mg/dL — ABNORMAL HIGH (ref 70–99)
Glucose-Capillary: 252 mg/dL — ABNORMAL HIGH (ref 70–99)
Glucose-Capillary: 297 mg/dL — ABNORMAL HIGH (ref 70–99)
Glucose-Capillary: 220 mg/dL — ABNORMAL HIGH (ref 70–99)

## 2022-04-15 MED ORDER — METHYLPREDNISOLONE SODIUM SUCC 40 MG IJ SOLR
40.0000 mg | Freq: Two times a day (BID) | INTRAMUSCULAR | Status: DC
  Administered 2022-04-15 – 2022-04-19 (×10): 40 mg via INTRAVENOUS
  Filled 2022-04-15 (×10): qty 1

## 2022-04-15 MED ORDER — LACTATED RINGERS IV SOLN: INTRAVENOUS | Status: DC

## 2022-04-15 NOTE — Progress Notes (Signed)
PROGRESS NOTE    Edward [name]  IOX:[id number] DOB: 1935/08/02 DOA: 04/14/2022 PCP: Susy Frizzle, MD   Brief Narrative:    Edward [name] is a 87 y.o. male with medical history significant for COPD and chronic hypoxemic respiratory failure on 3 L nasal cannula who was recently discharged on 6/13 after treatment for acute COPD exacerbation and metabolic encephalopathy in the setting of pneumonia.  He was discharged with a regimen of oral antibiotics and steroid taper, but it does not appear that he actually picked up these medications.  He states that he has had some persistent dyspnea that has been worsening along with a nonproductive cough for the last 2-3 weeks.  He was readmitted with acute COPD exacerbation in the setting of persistent pneumonia.  Assessment & Plan:   Principal Problem:   COPD with acute exacerbation ([place]) Active Problems:   Hypertension   DM type 2 causing vascular disease ([name])   Chronic diastolic CHF (congestive heart failure) ([name])   AF (paroxysmal atrial fibrillation) ([name])   Hyponatremia  Assessment and Plan:   Acute COPD exacerbation with concern for persistent pneumonia -Continue IV Solu-Medrol twice daily as ordered, decrease to 40 mg twice daily -DuoNebs as needed for shortness of breath or wheezing -Antitussives -Continue vancomycin and cefepime empirically for HCAP and check MRSA screen -Follow lab work -Currently on baseline 3 L nasal cannula oxygen -Consult to palliative care per PCCM recommendations, pending   History of paroxysmal atrial fibrillation -Continue Eliquis at home and metoprolol for heart rate control   Chronic diastolic CHF -2D echocardiogram 1 month ago with EF 60-65% and grade 2 diastolic dysfunction   CKD stage IIIb -Continue to monitor -Lasix discontinued for creatinine bump, no AKI   Normocytic anemia -Continue to monitor   Hypertension -Continue home medications and monitor   Type 2 diabetes -A1c 13.6% on  6/9 -SSI coverage especially with steroids   Deconditioning/debility -Home health PT    DVT prophylaxis: Eliquis Code Status: Full Family Communication: Discussed with son on phone 7/15 Disposition Plan:  Status is: Inpatient Remains inpatient appropriate because: Continues to require IV medications/antibiotics   Consultants:  PCCM Palliative  Procedures:  None  Antimicrobials:  Anti-infectives (From admission, onward)    Start     Dose/Rate Route Frequency Ordered Stop   04/14/22 2200  vancomycin (VANCOREADY) IVPB 750 mg/150 mL        750 mg 150 mL/hr over 60 Minutes Intravenous Every 12 hours 04/14/22 1151     04/14/22 1015  ceFEPIme (MAXIPIME) 2 g in sodium chloride 0.9 % 100 mL IVPB        2 g 200 mL/hr over 30 Minutes Intravenous Every 12 hours 04/14/22 1009     04/14/22 1015  vancomycin (VANCOREADY) IVPB 1500 mg/300 mL        1,500 mg 150 mL/hr over 120 Minutes Intravenous  Once 04/14/22 1013 04/14/22 1410      Subjective: Patient seen and evaluated today and states that he would like to go home.  Dyspnea has improved as well as cough.  He continues to have some ongoing wheezing.  Objective: Vitals:   04/14/22 2047 04/15/22 0505 04/15/22 0742 04/15/22 0830  BP: (!) 126/53 120/60  136/66  Pulse: (!) 56 60  69  Resp: 16 20    Temp: 97.8 F (36.6 C) 98 F (36.7 C)    TempSrc: Oral Oral    SpO2: 97% 97% 96%   Weight:  Height:        Intake/Output Summary (Last 24 hours) at 04/15/2022 1008 Last data filed at 04/15/2022 0714 Gross per 24 hour  Intake 1132.71 ml  Output 400 ml  Net 732.71 ml   Filed Weights   04/14/22 0727 04/14/22 1440  Weight: 72.6 kg 72 kg    Examination:  General exam: Appears calm and comfortable  Respiratory system: Mild wheezing bilaterally, 3 L nasal cannula Cardiovascular system: S1 & S2 heard, RRR.  Gastrointestinal system: Abdomen is soft Central nervous system: Alert and awake Extremities: No edema Skin: No  significant lesions noted Psychiatry: Flat affect.    Data Reviewed: I have personally reviewed following labs and imaging studies  CBC: Recent Labs  Lab 04/14/22 0743 04/15/22 0653  WBC 7.0 9.4  NEUTROABS 4.3  --   HGB 11.9* 10.3*  HCT 37.1* 32.4*  MCV 87.3 87.8  PLT 272 765   Basic Metabolic Panel: Recent Labs  Lab 04/14/22 0743 04/15/22 0653  NA 133* 135  K 3.9 4.6  CL 100 103  CO2 26 25  GLUCOSE 231* 187*  BUN 40* 49*  CREATININE 1.13 1.46*  CALCIUM 8.8* 8.3*  MG  --  2.7*   GFR: Estimated Creatinine Clearance: 37.7 mL/min (A) (by C-G formula based on SCr of 1.46 mg/dL (H)). Liver Function Tests: Recent Labs  Lab 04/14/22 0743  AST 15  ALT 12  ALKPHOS 74  BILITOT 0.3  PROT 7.4  ALBUMIN 3.3*   No results for input(s): "LIPASE", "AMYLASE" in the last 168 hours. No results for input(s): "AMMONIA" in the last 168 hours. Coagulation Profile: No results for input(s): "INR", "PROTIME" in the last 168 hours. Cardiac Enzymes: No results for input(s): "CKTOTAL", "CKMB", "CKMBINDEX", "TROPONINI" in the last 168 hours. BNP (last 3 results) No results for input(s): "PROBNP" in the last 8760 hours. HbA1C: No results for input(s): "HGBA1C" in the last 72 hours. CBG: Recent Labs  Lab 04/14/22 1141 04/14/22 1416 04/14/22 1648 04/14/22 2039 04/15/22 0745  GLUCAP 217* 159* 124* 177* 186*   Lipid Profile: No results for input(s): "CHOL", "HDL", "LDLCALC", "TRIG", "CHOLHDL", "LDLDIRECT" in the last 72 hours. Thyroid Function Tests: No results for input(s): "TSH", "T4TOTAL", "FREET4", "T3FREE", "THYROIDAB" in the last 72 hours. Anemia Panel: No results for input(s): "VITAMINB12", "FOLATE", "FERRITIN", "TIBC", "IRON", "RETICCTPCT" in the last 72 hours. Sepsis Labs: Recent Labs  Lab 04/14/22 1010  PROCALCITON 0.17    No results found for this or any previous visit (from the past 240 hour(s)).       Radiology Studies: CT Chest W Contrast  Result  Date: 04/14/2022 CLINICAL DATA:  Shortness of breath for 2 weeks. Abnormal chest x-ray with persistent left lung opacities. Evaluate for soft tissue mass. EXAM: CT CHEST WITH CONTRAST TECHNIQUE: Multidetector CT imaging of the chest was performed during intravenous contrast administration. RADIATION DOSE REDUCTION: This exam was performed according to the departmental dose-optimization program which includes automated exposure control, adjustment of the mA and/or kV according to patient size and/or use of iterative reconstruction technique. CONTRAST:  67m OMNIPAQUE IOHEXOL 300 MG/ML  SOLN COMPARISON:  Multiple radiographs, most recently 04/14/2022 and 03/20/2022. CT 02/28/2017. FINDINGS: Cardiovascular: Diffuse atherosclerosis of the aorta, great vessels and coronary arteries. No acute vascular findings are evident. There are calcifications of the aortic valve. The heart size is normal. There is no pericardial effusion. Mediastinum/Nodes: There are no enlarged mediastinal, hilar or axillary lymph nodes.Mildly prominent left infrahilar lymph nodes, likely reactive. The thyroid gland, trachea  and esophagus demonstrate no significant findings. Lungs/Pleura: Trace left pleural effusion. No right pleural effusion or pneumothorax. Underlying moderate centrilobular and paraseptal emphysema. On the remote prior chest CT, there were extensive right upper and bilateral lower lobe infiltrates which have resolved. Mild residual right lung scarring is noted. There is focal airspace disease peripherally and anteriorly in the left upper lobe, as seen on recent radiographs. These opacities were first seen 03/10/2022 and are new from 02/07/2022, and therefore consistent with pneumonia. In review of prior radiographs, there appears to be mild improvement from 03/10/2022. There is some associated central airway thickening and luminal narrowing, and as above, probable reactive left hilar adenopathy. No focal lung mass identified to  suggest malignancy. Upper abdomen: The visualized upper abdomen appears stable without acute findings. There is fatty replacement of the pancreas and aortic atherosclerosis. Musculoskeletal/Chest wall: There is no chest wall mass or suspicious osseous finding. Bilateral gynecomastia, similar to previous study. Multilevel spondylosis. IMPRESSION: 1. Persistent left upper lobe airspace disease, new from radiographs of 02/07/2022, but incompletely resolved. Findings remain most consistent with pneumonia. Continued radiographic follow-up in 4-6 weeks recommended. 2. No focal lung mass identified to suggest malignancy. There is left hilar adenopathy which is nonspecific, although likely reactive. Mild associated central airway thickening and luminal narrowing. 3. Coronary and aortic atherosclerosis (ICD10-I70.0). Emphysema (ICD10-J43.9). Electronically Signed   By: Richardean Sale M.D.   On: 04/14/2022 09:24   DG Chest 2 View  Result Date: 04/14/2022 CLINICAL DATA:  SOB EXAM: CHEST - 2 VIEW COMPARISON:  Chest x-ray June 19, 23. FINDINGS: Slightly progressed interstitial and airspace opacities in the left mid and upper lung. No visible pleural effusions pneumothorax. Similar cardiomediastinal silhouette. No acute osseous abnormality. IMPRESSION: Slightly progressed interstitial and airspace opacities in the left mid and upper lung, concerning for pneumonia. Given persistence, recommend CT chest (preferably with contrast) to further evaluate and exclude alternative etiologies such as malignancy. Electronically Signed   By: Margaretha Sheffield M.D.   On: 04/14/2022 08:31        Scheduled Meds:  apixaban  5 mg Oral BID   arformoterol  15 mcg Nebulization BID   budesonide (PULMICORT) nebulizer solution  0.25 mg Nebulization BID   cloNIDine  0.1 mg Oral BID   furosemide  40 mg Oral Daily   insulin aspart  0-15 Units Subcutaneous TID WC   insulin aspart  0-5 Units Subcutaneous QHS   insulin glargine-yfgn  15  Units Subcutaneous Daily   methylPREDNISolone (SOLU-MEDROL) injection  40 mg Intravenous Q12H   metoprolol tartrate  25 mg Oral BID   pioglitazone  30 mg Oral Daily   potassium chloride SA  20 mEq Oral Daily   pravastatin  80 mg Oral QHS   pregabalin  100 mg Oral BID   revefenacin  175 mcg Nebulization Daily   Continuous Infusions:  ceFEPime (MAXIPIME) IV 2 g (04/15/22 0957)   lactated ringers     vancomycin 750 mg (04/15/22 0835)     LOS: 1 day    Time spent: 35 minutes    Dallyn Bergland Darleen Crocker, DO [facility]  If 7PM-7AM, please contact night-coverage [email or web address] 04/15/2022, 10:08 AM

## 2022-04-15 NOTE — Plan of Care (Signed)

## 2022-04-16 ENCOUNTER — Inpatient Hospital Stay (HOSPITAL_COMMUNITY): Payer: Medicare Other

## 2022-04-16 DIAGNOSIS — J441 Chronic obstructive pulmonary disease with (acute) exacerbation: Secondary | ICD-10-CM | POA: Diagnosis not present

## 2022-04-16 LAB — BASIC METABOLIC PANEL
Anion gap: 9 (ref 5–15)
BUN: 72 mg/dL — ABNORMAL HIGH (ref 8–23)
CO2: 20 mmol/L — ABNORMAL LOW (ref 22–32)
Calcium: 8 mg/dL — ABNORMAL LOW (ref 8.9–10.3)
Chloride: 103 mmol/L (ref 98–111)
Creatinine, Ser: 2.16 mg/dL — ABNORMAL HIGH (ref 0.61–1.24)
GFR, Estimated: 29 mL/min — ABNORMAL LOW (ref >60)
Glucose, Bld: 192 mg/dL — ABNORMAL HIGH (ref 70–99)
Potassium: 4.6 mmol/L (ref 3.5–5.1)
Sodium: 132 mmol/L — ABNORMAL LOW (ref 135–145)

## 2022-04-16 LAB — GLUCOSE, CAPILLARY
Glucose-Capillary: 181 mg/dL — ABNORMAL HIGH (ref 70–99)
Glucose-Capillary: 231 mg/dL — ABNORMAL HIGH (ref 70–99)
Glucose-Capillary: 176 mg/dL — ABNORMAL HIGH (ref 70–99)
Glucose-Capillary: 237 mg/dL — ABNORMAL HIGH (ref 70–99)

## 2022-04-16 LAB — CBC
HCT: 30 % — ABNORMAL LOW (ref 39.0–52.0)
Hemoglobin: 9.4 g/dL — ABNORMAL LOW (ref 13.0–17.0)
MCH: 27.7 pg (ref 26.0–34.0)
MCHC: 31.3 g/dL (ref 30.0–36.0)
MCV: 88.5 fL (ref 80.0–100.0)
Platelets: 254 10*3/uL (ref 150–400)
RBC: 3.39 MIL/uL — ABNORMAL LOW (ref 4.22–5.81)
RDW: 15.7 % — ABNORMAL HIGH (ref 11.5–15.5)
WBC: 13.1 10*3/uL — ABNORMAL HIGH (ref 4.0–10.5)
nRBC: 0 % (ref 0.0–0.2)

## 2022-04-16 LAB — MAGNESIUM: Magnesium: 2.7 mg/dL — ABNORMAL HIGH (ref 1.7–2.4)

## 2022-04-16 LAB — VANCOMYCIN, TROUGH
Vancomycin Tr: 50 ug/mL (ref 15–20)
Vancomycin Tr: 37 ug/mL (ref 15–20)

## 2022-04-16 MED ORDER — SODIUM CHLORIDE 0.9 % IV SOLN
2.0000 g | INTRAVENOUS | Status: DC
  Filled 2022-04-16: qty 12.5

## 2022-04-16 MED ORDER — APIXABAN 2.5 MG PO TABS
2.5000 mg | ORAL_TABLET | Freq: Two times a day (BID) | ORAL | Status: DC
  Administered 2022-04-16 – 2022-04-17 (×3): 2.5 mg via ORAL
  Filled 2022-04-16 (×3): qty 1

## 2022-04-16 MED ORDER — SODIUM CHLORIDE 0.9 % IV SOLN: INTRAVENOUS | Status: AC

## 2022-04-16 NOTE — Progress Notes (Signed)
Pharmacy Antibiotic Note  Edward [name] a 87 y.o. male admitted on 04/16/2022 with sepsis.  Pharmacy has been consulted for vancomycin and cefepime dosing.  Plan: Hold Vancomycin due to worsening renal fxn.  Vanc level was drawn after a dose was given so represents a peak and not a trough.  Will recheck level tonight >> repeat level = 37.  Continue to hold Vancomycin, f/u SCR in am Monitor renal fxn daily.   Reduce Cefepime to 2gm IV every 24 hours.  Medical History: Past Medical History:  Diagnosis Date   Allergy    Rhinitis   Atrial fibrillation ([name])    Bronchitis    Chronic respiratory failure ([name])    Colon polyps    COPD (chronic obstructive pulmonary disease) ([place])    Diabetes mellitus    Hypercholesterolemia    Hypertension    Iron deficiency anemia due to chronic blood loss 04/30/2018   Noncompliance    On home O2    2L N/C    PSA elevation    Pulmonary fibrosis ([name])    Vitamin D deficiency     Allergies:  Allergies  Allergen Reactions   Ace Inhibitors Other (See Comments)    Hyperkalemia--07/23/2013:patient states not familiar with the following allergy    Filed Weights   04/14/22 0727 04/14/22 1440  Weight: 72.6 kg (160 lb) 72 kg (158 lb 11.7 oz)       Latest Ref Rng & Units 04/16/2022    5:18 AM 04/15/2022    6:53 AM 04/14/2022    7:43 AM  CBC  WBC 4.0 - 10.5 K/uL 13.1  9.4  7.0   Hemoglobin 13.0 - 17.0 g/dL 9.4  10.3  11.9   Hematocrit 39.0 - 52.0 % 30.0  32.4  37.1   Platelets 150 - 400 K/uL 254  274  272    Estimated Creatinine Clearance: 25.5 mL/min (A) (by C-G formula based on SCr of 2.16 mg/dL (H)).  Antibiotics Given (last 72 hours)     Date/Time Action Medication Dose Rate   04/14/22 1036 New Bag/Given   ceFEPIme (MAXIPIME) 2 g in sodium chloride 0.9 % 100 mL IVPB 2 g 200 mL/hr   04/14/22 1154 New Bag/Given   vancomycin (VANCOREADY) IVPB 1500 mg/300 mL 1,500 mg 150 mL/hr   04/14/22 2209 New Bag/Given   ceFEPIme (MAXIPIME) 2 g in sodium  chloride 0.9 % 100 mL IVPB 2 g 200 mL/hr   04/14/22 2313 New Bag/Given   vancomycin (VANCOREADY) IVPB 750 mg/150 mL 750 mg 150 mL/hr   04/15/22 0835 New Bag/Given   vancomycin (VANCOREADY) IVPB 750 mg/150 mL 750 mg 150 mL/hr   04/15/22 0957 New Bag/Given   ceFEPIme (MAXIPIME) 2 g in sodium chloride 0.9 % 100 mL IVPB 2 g 200 mL/hr   04/15/22 2116 New Bag/Given   ceFEPIme (MAXIPIME) 2 g in sodium chloride 0.9 % 100 mL IVPB 2 g 200 mL/hr   04/15/22 2246 New Bag/Given   vancomycin (VANCOREADY) IVPB 750 mg/150 mL 750 mg 150 mL/hr   04/16/22 0853 New Bag/Given   vancomycin (VANCOREADY) IVPB 750 mg/150 mL 750 mg 150 mL/hr   04/16/22 1028 New Bag/Given   ceFEPIme (MAXIPIME) 2 g in sodium chloride 0.9 % 100 mL IVPB 2 g 200 mL/hr      Antimicrobials this admission:  Cefepime 04/14/2022  >>  vancomycin 04/14/2022  >>   Microbiology results: 04/14/2022  BCx: sent 04/14/2022  UCx: sent 04/14/2022  Resp Panel: sent  04/14/2022  MRSA PCR: sent  Thank you for allowing pharmacy to be a part of this patient's care.  Ena Dawley, PharmD Clinical Pharmacist

## 2022-04-16 NOTE — Progress Notes (Signed)
PROGRESS NOTE    Edward [name]  PNT:[id number] DOB: 28-Jun-1935 DOA: 04/14/2022 PCP: Susy Frizzle, MD   Brief Narrative:    Edward [name] is a 87 y.o. male with medical history significant for COPD and chronic hypoxemic respiratory failure on 3 L nasal cannula who was recently discharged on 6/13 after treatment for acute COPD exacerbation and metabolic encephalopathy in the setting of pneumonia.  He was discharged with a regimen of oral antibiotics and steroid taper, but it does not appear that he actually picked up these medications.  He states that he has had some persistent dyspnea that has been worsening along with a nonproductive cough for the last 2-3 weeks.  He was readmitted with acute COPD exacerbation in the setting of persistent pneumonia.  He is also now noted to have worsening AKI while being treated with vancomycin.  He was noted to have 1 blood culture positive with MRSA.  Assessment & Plan:   Principal Problem:   COPD with acute exacerbation ([place]) Active Problems:   Hypertension   DM type 2 causing vascular disease ([name])   Chronic diastolic CHF (congestive heart failure) ([name])   AF (paroxysmal atrial fibrillation) ([name])   Hyponatremia  Assessment and Plan:   Acute COPD exacerbation with concern for persistent pneumonia -Continue IV Solu-Medrol twice daily as ordered, decrease to 40 mg twice daily -DuoNebs as needed for shortness of breath or wheezing -Antitussives -Continue vancomycin and cefepime empirically for HCAP and check MRSA screen -Follow lab work -Currently on baseline 3 L nasal cannula oxygen -Consult to palliative care per PCCM recommendations, pending  Possible MRSA bacteremia -Check blood cultures -Continue vancomycin   History of paroxysmal atrial fibrillation -Continue Eliquis at home and metoprolol for heart rate control   Chronic diastolic CHF -2D echocardiogram 1 month ago with EF 60-65% and grade 2 diastolic dysfunction -Hold Lasix  for now and give gentle IV fluid given AKI   AKI on CKD stage IIIb -Discontinue Lasix -Gentle IV fluid -Strict I's and O's -Urine studies -Renal ultrasound -Vancomycin trough -A.m. labs  Mild hyponatremia -Start normal saline -Monitor repeat labs in a.m. -Urine sodium   Normocytic anemia -Continue to monitor   Hypertension -Continue home medications and monitor   Type 2 diabetes -A1c 13.6% on 6/9 -SSI coverage especially with steroids   Deconditioning/debility -Home health PT     DVT prophylaxis: Eliquis Code Status: Full Family Communication: Discussed with son on phone 7/15 Disposition Plan:  Status is: Inpatient Remains inpatient appropriate because: Continues to require IV medications/antibiotics     Consultants:  PCCM Palliative   Procedures:  None   Antimicrobials:  Anti-infectives (From admission, onward)    Start     Dose/Rate Route Frequency Ordered Stop   04/14/22 2200  vancomycin (VANCOREADY) IVPB 750 mg/150 mL        750 mg 150 mL/hr over 60 Minutes Intravenous Every 12 hours 04/14/22 1151     04/14/22 1015  ceFEPIme (MAXIPIME) 2 g in sodium chloride 0.9 % 100 mL IVPB        2 g 200 mL/hr over 30 Minutes Intravenous Every 12 hours 04/14/22 1009     04/14/22 1015  vancomycin (VANCOREADY) IVPB 1500 mg/300 mL        1,500 mg 150 mL/hr over 120 Minutes Intravenous  Once 04/14/22 1013 04/14/22 1410       Subjective: Patient seen and evaluated today with no new acute complaints or concerns. No acute concerns or events noted overnight.  Objective: Vitals:   04/15/22 1943 04/15/22 2035 04/16/22 0442 04/16/22 0846  BP:  (!) 108/54 126/65 115/61  Pulse: 67 67 (!) 58 68  Resp: '18 18 18   '$ Temp:  (!) 97.4 F (36.3 C) (!) 97.5 F (36.4 C)   TempSrc:  Oral Oral   SpO2: 90% 100% 100%   Weight:      Height:        Intake/Output Summary (Last 24 hours) at 04/16/2022 0955 Last data filed at 04/15/2022 1700 Gross per 24 hour  Intake 240 ml   Output 500 ml  Net -260 ml   Filed Weights   04/14/22 0727 04/14/22 1440  Weight: 72.6 kg 72 kg    Examination:  General exam: Appears calm and comfortable  Respiratory system: Clear to auscultation. Respiratory effort normal.  Currently receiving breathing treatment. Cardiovascular system: S1 & S2 heard, RRR.  Gastrointestinal system: Abdomen is soft Central nervous system: Alert and awake Extremities: No edema Skin: No significant lesions noted Psychiatry: Flat affect.    Data Reviewed: I have personally reviewed following labs and imaging studies  CBC: Recent Labs  Lab 04/14/22 0743 04/15/22 0653 04/16/22 0518  WBC 7.0 9.4 13.1*  NEUTROABS 4.3  --   --   HGB 11.9* 10.3* 9.4*  HCT 37.1* 32.4* 30.0*  MCV 87.3 87.8 88.5  PLT 272 274 660   Basic Metabolic Panel: Recent Labs  Lab 04/14/22 0743 04/15/22 0653 04/16/22 0518  NA 133* 135 132*  K 3.9 4.6 4.6  CL 100 103 103  CO2 26 25 20*  GLUCOSE 231* 187* 192*  BUN 40* 49* 72*  CREATININE 1.13 1.46* 2.16*  CALCIUM 8.8* 8.3* 8.0*  MG  --  2.7* 2.7*   GFR: Estimated Creatinine Clearance: 25.5 mL/min (A) (by C-G formula based on SCr of 2.16 mg/dL (H)). Liver Function Tests: Recent Labs  Lab 04/14/22 0743  AST 15  ALT 12  ALKPHOS 74  BILITOT 0.3  PROT 7.4  ALBUMIN 3.3*   No results for input(s): "LIPASE", "AMYLASE" in the last 168 hours. No results for input(s): "AMMONIA" in the last 168 hours. Coagulation Profile: No results for input(s): "INR", "PROTIME" in the last 168 hours. Cardiac Enzymes: No results for input(s): "CKTOTAL", "CKMB", "CKMBINDEX", "TROPONINI" in the last 168 hours. BNP (last 3 results) No results for input(s): "PROBNP" in the last 8760 hours. HbA1C: No results for input(s): "HGBA1C" in the last 72 hours. CBG: Recent Labs  Lab 04/15/22 0745 04/15/22 1116 04/15/22 1608 04/15/22 2105 04/16/22 0754  GLUCAP 186* 252* 297* 220* 181*   Lipid Profile: No results for input(s):  "CHOL", "HDL", "LDLCALC", "TRIG", "CHOLHDL", "LDLDIRECT" in the last 72 hours. Thyroid Function Tests: No results for input(s): "TSH", "T4TOTAL", "FREET4", "T3FREE", "THYROIDAB" in the last 72 hours. Anemia Panel: No results for input(s): "VITAMINB12", "FOLATE", "FERRITIN", "TIBC", "IRON", "RETICCTPCT" in the last 72 hours. Sepsis Labs: Recent Labs  Lab 04/14/22 1010  PROCALCITON 0.17    No results found for this or any previous visit (from the past 240 hour(s)).       Radiology Studies: No results found.      Scheduled Meds:  apixaban  5 mg Oral BID   arformoterol  15 mcg Nebulization BID   budesonide (PULMICORT) nebulizer solution  0.25 mg Nebulization BID   cloNIDine  0.1 mg Oral BID   insulin aspart  0-15 Units Subcutaneous TID WC   insulin aspart  0-5 Units Subcutaneous QHS   insulin glargine-yfgn  15  Units Subcutaneous Daily   methylPREDNISolone (SOLU-MEDROL) injection  40 mg Intravenous Q12H   metoprolol tartrate  25 mg Oral BID   pioglitazone  30 mg Oral Daily   potassium chloride SA  20 mEq Oral Daily   pravastatin  80 mg Oral QHS   pregabalin  100 mg Oral BID   revefenacin  175 mcg Nebulization Daily   Continuous Infusions:  sodium chloride     ceFEPime (MAXIPIME) IV Stopped (04/15/22 2300)   vancomycin 750 mg (04/16/22 0853)     LOS: 2 days    Time spent: 35 minutes    Parvin Stetzer D [name], DO [facility]  If 7PM-7AM, please contact night-coverage [email or web address] 04/16/2022, 9:55 AM

## 2022-04-16 NOTE — Progress Notes (Signed)
Pharmacy Antibiotic Note  Edward [name] a 87 y.o. male admitted on 04/16/2022 with sepsis.  Pharmacy has been consulted for vancomycin and cefepime dosing.  Plan: Hold Vancomycin due to worsening renal fxn.  Vanc level was drawn after a dose was given so represents a peak and not a trough.  Will recheck level tonight. Monitor renal fxn daily.   Reduce Cefepime to 2gm IV every 24 hours.  Medical History: Past Medical History:  Diagnosis Date   Allergy    Rhinitis   Atrial fibrillation ([name])    Bronchitis    Chronic respiratory failure ([name])    Colon polyps    COPD (chronic obstructive pulmonary disease) ([place])    Diabetes mellitus    Hypercholesterolemia    Hypertension    Iron deficiency anemia due to chronic blood loss 04/30/2018   Noncompliance    On home O2    2L N/C    PSA elevation    Pulmonary fibrosis ([name])    Vitamin D deficiency     Allergies:  Allergies  Allergen Reactions   Ace Inhibitors Other (See Comments)    Hyperkalemia--07/23/2013:patient states not familiar with the following allergy    Filed Weights   04/14/22 0727 04/14/22 1440  Weight: 72.6 kg (160 lb) 72 kg (158 lb 11.7 oz)       Latest Ref Rng & Units 04/16/2022    5:18 AM 04/15/2022    6:53 AM 04/14/2022    7:43 AM  CBC  WBC 4.0 - 10.5 K/uL 13.1  9.4  7.0   Hemoglobin 13.0 - 17.0 g/dL 9.4  10.3  11.9   Hematocrit 39.0 - 52.0 % 30.0  32.4  37.1   Platelets 150 - 400 K/uL 254  274  272    Estimated Creatinine Clearance: 25.5 mL/min (A) (by C-G formula based on SCr of 2.16 mg/dL (H)).  Antibiotics Given (last 72 hours)     Date/Time Action Medication Dose Rate   04/14/22 1036 New Bag/Given   ceFEPIme (MAXIPIME) 2 g in sodium chloride 0.9 % 100 mL IVPB 2 g 200 mL/hr   04/14/22 1154 New Bag/Given   vancomycin (VANCOREADY) IVPB 1500 mg/300 mL 1,500 mg 150 mL/hr   04/14/22 2209 New Bag/Given   ceFEPIme (MAXIPIME) 2 g in sodium chloride 0.9 % 100 mL IVPB 2 g 200 mL/hr   04/14/22 2313 New  Bag/Given   vancomycin (VANCOREADY) IVPB 750 mg/150 mL 750 mg 150 mL/hr   04/15/22 0835 New Bag/Given   vancomycin (VANCOREADY) IVPB 750 mg/150 mL 750 mg 150 mL/hr   04/15/22 0957 New Bag/Given   ceFEPIme (MAXIPIME) 2 g in sodium chloride 0.9 % 100 mL IVPB 2 g 200 mL/hr   04/15/22 2116 New Bag/Given   ceFEPIme (MAXIPIME) 2 g in sodium chloride 0.9 % 100 mL IVPB 2 g 200 mL/hr   04/15/22 2246 New Bag/Given   vancomycin (VANCOREADY) IVPB 750 mg/150 mL 750 mg 150 mL/hr   04/16/22 0853 New Bag/Given   vancomycin (VANCOREADY) IVPB 750 mg/150 mL 750 mg 150 mL/hr   04/16/22 1028 New Bag/Given   ceFEPIme (MAXIPIME) 2 g in sodium chloride 0.9 % 100 mL IVPB 2 g 200 mL/hr      Antimicrobials this admission:  Cefepime 04/14/2022  >>  vancomycin 04/14/2022  >>   Microbiology results: 04/14/2022  BCx: sent 04/14/2022  UCx: sent 04/14/2022  Resp Panel: sent  04/14/2022  MRSA PCR: sent  Thank you for allowing pharmacy to be a part of  this patient's care.  Ena Dawley, PharmD Clinical Pharmacist

## 2022-04-16 NOTE — Progress Notes (Signed)
   04/16/22 1138  Provider Notification  Provider Name/Title shah  Date Provider Notified 04/16/22  Time Provider Notified 1138  Method of Notification Page  Notification Reason Critical result  Test performed and critical result vanc troph  Date Critical Result Received 04/16/22  Time Critical Result Received 1138  Date of Provider Response 04/16/22  Time of Provider Response 1139

## 2022-04-17 ENCOUNTER — Inpatient Hospital Stay (HOSPITAL_COMMUNITY): Payer: Medicare Other

## 2022-04-17 DIAGNOSIS — N19 Unspecified kidney failure: Secondary | ICD-10-CM | POA: Diagnosis not present

## 2022-04-17 DIAGNOSIS — I5032 Chronic diastolic (congestive) heart failure: Secondary | ICD-10-CM

## 2022-04-17 DIAGNOSIS — Z7189 Other specified counseling: Secondary | ICD-10-CM

## 2022-04-17 DIAGNOSIS — G9341 Metabolic encephalopathy: Secondary | ICD-10-CM

## 2022-04-17 DIAGNOSIS — R278 Other lack of coordination: Secondary | ICD-10-CM

## 2022-04-17 DIAGNOSIS — J189 Pneumonia, unspecified organism: Secondary | ICD-10-CM

## 2022-04-17 DIAGNOSIS — G928 Other toxic encephalopathy: Secondary | ICD-10-CM

## 2022-04-17 DIAGNOSIS — Z515 Encounter for palliative care: Secondary | ICD-10-CM

## 2022-04-17 DIAGNOSIS — I480 Paroxysmal atrial fibrillation: Secondary | ICD-10-CM | POA: Diagnosis not present

## 2022-04-17 DIAGNOSIS — J441 Chronic obstructive pulmonary disease with (acute) exacerbation: Secondary | ICD-10-CM | POA: Diagnosis not present

## 2022-04-17 LAB — CBC
HCT: 31.7 % — ABNORMAL LOW (ref 39.0–52.0)
Hemoglobin: 10 g/dL — ABNORMAL LOW (ref 13.0–17.0)
MCH: 28 pg (ref 26.0–34.0)
MCHC: 31.5 g/dL (ref 30.0–36.0)
MCV: 88.8 fL (ref 80.0–100.0)
Platelets: 267 10*3/uL (ref 150–400)
RBC: 3.57 MIL/uL — ABNORMAL LOW (ref 4.22–5.81)
RDW: 15.5 % (ref 11.5–15.5)
WBC: 11.8 10*3/uL — ABNORMAL HIGH (ref 4.0–10.5)
nRBC: 0 % (ref 0.0–0.2)

## 2022-04-17 LAB — BLOOD GAS, VENOUS
Acid-base deficit: 10.1 mmol/L — ABNORMAL HIGH (ref 0.0–2.0)
Bicarbonate: 17.6 mmol/L — ABNORMAL LOW (ref 20.0–28.0)
Drawn by: 53361
O2 Saturation: 68.2 %
Patient temperature: 36.4
pCO2, Ven: 44 mmHg (ref 44–60)
pH, Ven: 7.21 — ABNORMAL LOW (ref 7.25–7.43)
pO2, Ven: 40 mmHg (ref 32–45)

## 2022-04-17 LAB — BASIC METABOLIC PANEL
Anion gap: 10 (ref 5–15)
BUN: 89 mg/dL — ABNORMAL HIGH (ref 8–23)
CO2: 18 mmol/L — ABNORMAL LOW (ref 22–32)
Calcium: 7.8 mg/dL — ABNORMAL LOW (ref 8.9–10.3)
Chloride: 106 mmol/L (ref 98–111)
Creatinine, Ser: 2.65 mg/dL — ABNORMAL HIGH (ref 0.61–1.24)
GFR, Estimated: 23 mL/min — ABNORMAL LOW (ref >60)
Glucose, Bld: 147 mg/dL — ABNORMAL HIGH (ref 70–99)
Potassium: 4.8 mmol/L (ref 3.5–5.1)
Sodium: 134 mmol/L — ABNORMAL LOW (ref 135–145)

## 2022-04-17 LAB — BRAIN NATRIURETIC PEPTIDE: B Natriuretic Peptide: 626 pg/mL — ABNORMAL HIGH (ref 0.0–100.0)

## 2022-04-17 LAB — HEPATIC FUNCTION PANEL
ALT: 11 U/L (ref 0–44)
AST: 11 U/L — ABNORMAL LOW (ref 15–41)
Albumin: 2.8 g/dL — ABNORMAL LOW (ref 3.5–5.0)
Alkaline Phosphatase: 55 U/L (ref 38–126)
Bilirubin, Direct: <0.1 mg/dL (ref 0.0–0.2)
Total Bilirubin: 0.4 mg/dL (ref 0.3–1.2)
Total Protein: 6.4 g/dL — ABNORMAL LOW (ref 6.5–8.1)

## 2022-04-17 LAB — GLUCOSE, CAPILLARY
Glucose-Capillary: 133 mg/dL — ABNORMAL HIGH (ref 70–99)
Glucose-Capillary: 124 mg/dL — ABNORMAL HIGH (ref 70–99)
Glucose-Capillary: 107 mg/dL — ABNORMAL HIGH (ref 70–99)
Glucose-Capillary: 81 mg/dL (ref 70–99)

## 2022-04-17 LAB — AMMONIA: Ammonia: 22 umol/L (ref 9–35)

## 2022-04-17 LAB — MAGNESIUM: Magnesium: 2.8 mg/dL — ABNORMAL HIGH (ref 1.7–2.4)

## 2022-04-17 MED ORDER — PREGABALIN 75 MG PO CAPS
75.0000 mg | ORAL_CAPSULE | Freq: Two times a day (BID) | ORAL | Status: DC
Start: 2022-04-17 — End: 2022-04-17
  Administered 2022-04-17: 75 mg via ORAL

## 2022-04-17 MED ORDER — SODIUM CHLORIDE 0.9 % IV SOLN
3.0000 g | Freq: Two times a day (BID) | INTRAVENOUS | Status: DC
  Administered 2022-04-17 – 2022-04-20 (×7): 3 g via INTRAVENOUS
  Filled 2022-04-17 (×10): qty 8

## 2022-04-17 MED ORDER — SODIUM CHLORIDE 0.9 % IV SOLN
INTRAVENOUS | Status: DC
Start: 2022-04-17 — End: 2022-04-17

## 2022-04-17 NOTE — Progress Notes (Signed)
Pharmacy Antibiotic Note  Edward [name] is a 87 y.o. male admitted on 04/14/2022 with pneumonia and sepsis.  Pharmacy has been consulted for vancomycin and unasyn  dosing.  Plan: VT 37- holding vancomycin and F/U random vanco level in AM Unasyn 3000 mg IV every 12 hours. Monitor labs, c/s, and vanco levels as indicated.  Height: '5\' 9"'$  (175.3 cm) Weight: 72 kg (158 lb 11.7 oz) IBW/kg (Calculated) : 70.7  Temp (24hrs), Avg:97.7 F (36.5 C), Min:97.5 F (36.4 C), Max:97.8 F (36.6 C)  Recent Labs  Lab 04/14/22 0743 04/15/22 0653 04/16/22 0518 04/16/22 1034 04/16/22 2012 04/17/22 0435  WBC 7.0 9.4 13.1*  --   --  11.8*  CREATININE 1.13 1.46* 2.16*  --   --  2.65*  VANCOTROUGH  --   --   --  50* 37*  --     Estimated Creatinine Clearance: 20.8 mL/min (A) (by C-G formula based on SCr of 2.65 mg/dL (H)).    Allergies  Allergen Reactions   Ace Inhibitors Other (See Comments)    Hyperkalemia--07/23/2013:patient states not familiar with the following allergy    Antimicrobials this admission: Unasyn 7/17 >> Cefepime 04/14/2022  >> 7/16 vancomycin 04/14/2022  >>    Microbiology results: 04/14/2022  BCx: ngtd 04/14/2022  MRSA PCR: pending  Thank you for allowing pharmacy to be a part of this patient's care.  Margot Ables, PharmD Clinical Pharmacist 04/17/2022 10:00 AM

## 2022-04-17 NOTE — Progress Notes (Signed)
Bedside shift report this shift at 7:10am pt was sleeping at this time. No abnormal changes throughout the night, remained on  2L nasal canula, VS stable. Went into the room with morning medications approx. 0830 to do assessment and pt seemed lethargic. Tried to talk and arouse patient and offer some of his breakfast, pt could not swallow well. Performed full assessment with NIH and spoke with MD. Code stroke called at [place]. Pt taken to CT and evaluated by tele neuro.

## 2022-04-17 NOTE — Progress Notes (Signed)
[facility]   Patient name: Edward [name] Admit date: 04/14/2022  DOB: 1934/10/20 LOS: 3  MRN: [id number] Consult date: 04/14/2022  Referring provider: Dr. [name] CC: Short of breath    History:  87 yo male recently quit smoking with hx of COPD GOLD ?  (no PFTs on file)  and chronic respiratory failure on 3 liters was in hospital in June 2023 for community acquired pneumonia, A fib with RVR, and acute metabolic encephalopathy.  He was sent home with antibiotics and steroid taper, but there was concern whether he picked up his prescriptions.  He had progressive cough and dyspnea, and returned to [facility] ER on 04/14/22.  He was started on antibiotics for pneumonia and treatment for COPD exacerbation.  PCCM consulted to assist with respiratory management.  Past medical history:  Allergies, Atrial fibrillation, Colon polyps, DM type 2, HLD, HTN, IDA, Vit D deficiency  Significant events:  7/14 Admit  Studies:  Echo 03/12/22 >> EF 60 to 65%, grade 2 DD, mild elevation in PASP CT chest 04/14/22 >> atherosclerosis, trace left effusion, moderate centrilobular and paraseptal emphysema, Rt lung scarring, focal ASD in LUL with improvement compared to 03/10/22  Micro:  BC x 2  7/16 >>>   Lines:     Antibiotics:  Cefepime 7/14 >> 7/16 Vancomycin 7/14 >> 7/16  Unasyn 7/17 >>>   Consults:  Neuro 7/17 c/w TME MRI rec    Scheduled Meds:  apixaban  2.5 mg Oral BID   arformoterol  15 mcg Nebulization BID   budesonide (PULMICORT) nebulizer solution  0.25 mg Nebulization BID   cloNIDine  0.1 mg Oral BID   insulin aspart  0-15 Units Subcutaneous TID WC   insulin aspart  0-5 Units Subcutaneous QHS   insulin glargine-yfgn  15 Units Subcutaneous Daily   methylPREDNISolone (SOLU-MEDROL) injection  40 mg Intravenous Q12H   metoprolol tartrate  25 mg Oral BID   pioglitazone  30 mg Oral Daily   potassium chloride SA  20 mEq Oral Daily   pravastatin   80 mg Oral QHS   revefenacin  175 mcg Nebulization Daily   Continuous Infusions:  ampicillin-sulbactam (UNASYN) IV 3 g (04/17/22 1137)   PRN Meds:.acetaminophen **OR** acetaminophen, albuterol, guaiFENesin-dextromethorphan, ondansetron **OR** ondansetron (ZOFRAN) IV    Interim history:  Very poorly responsive but no specific complaints/ won't really cough effectively to request   Vital signs:  BP 130/63 (BP Location: Left Arm)   Pulse (!) 56   Temp 97.6 F (36.4 C) (Oral)   Resp 15   Ht '5\' 9"'$  (1.753 m)   Wt 72 kg   SpO2 100%   BMI 23.44 kg/m   Intake/output:  I/O last 3 completed shifts: In: 893.8 [P.O.:480; I.V.:413.8] Out: 650 [Urine:650]   Physical exam:  Tmax:  97.8 General appearance:  elderly wm poorly responsie to verbal  At Rest 02 sats  100% on 2.5 lpm   No jvd Oropharynx clear,  mucosa very dry  Neck supple Lungs with a few scattered exp > insp rhonchi bilaterally RRR no s3 or or sign murmur Abd obese with limited  excursion  Extr warm with no edema or clubbing noted Neuro  Sensorium sleepy,  no apparent motor deficits      I personally reviewed images and agree with radiology impression as follows:  CXR:   portable 7/17 Increasing density within the LEFT upper lobe airspace disease. Findings remain compatible with pneumonia at this time but would suggest follow-up  to ensure resolution. Increasing lead defined or dense appearance could reflect evolution of the infectious process, not currently as diffuse as on the prior study  Best practice:  Per [facility]    Assessment/plan:   Acute on chronic hypoxic respiratory failure. - likely from slow to resolve pneumonia in setting of COPD exacerbation - antibiotics per primary team - continue  yupelri, pulmicort, brovana while in hospital - prn albuterol - continue solumedrol 40 mg q12h for now  >> he appears to me endstage in many respects, would start end of life discussions with pt/fm.   CKD 3b -  acute on chronic now with bicarb dropping likely renal in origin  Lab Results  Component Value Date   CREATININE 2.65 (H) 04/17/2022   CREATININE 2.16 (H) 04/16/2022   CREATININE 1.46 (H) 04/15/2022   >>>  ? Needs oracit / very poor HD candidate    Anemia of chronic disease. Lab Results  Component Value Date   HGB 10.0 (L) 04/17/2022   HGB 9.4 (L) 04/16/2022   HGB 10.3 (L) 04/15/2022    Paroxysmal atrial fibrillation. Chronic diastolic CHF DM type 2 with peripheral neuropathy. - per primary team  Resolved hospital problems:    Goals of care/Family discussions:  Code status: full code strongly rec palliative care/ ncb status  Labs:      Latest Ref Rng & Units 04/17/2022    9:20 AM 04/17/2022    4:35 AM 04/16/2022    5:18 AM  CMP  Glucose 70 - 99 mg/dL  147  192   BUN 8 - 23 mg/dL  89  72   Creatinine 0.61 - 1.24 mg/dL  2.65  2.16   Sodium 135 - 145 mmol/L  134  132   Potassium 3.5 - 5.1 mmol/L  4.8  4.6   Chloride 98 - 111 mmol/L  106  103   CO2 22 - 32 mmol/L  18  20   Calcium 8.9 - 10.3 mg/dL  7.8  8.0   Total Protein 6.5 - 8.1 g/dL 6.4     Total Bilirubin 0.3 - 1.2 mg/dL 0.4     Alkaline Phos 38 - 126 U/L 55     AST 15 - 41 U/L 11     ALT 0 - 44 U/L 11          Latest Ref Rng & Units 04/17/2022    4:35 AM 04/16/2022    5:18 AM 04/15/2022    6:53 AM  CBC  WBC 4.0 - 10.5 K/uL 11.8  13.1  9.4   Hemoglobin 13.0 - 17.0 g/dL 10.0  9.4  10.3   Hematocrit 39.0 - 52.0 % 31.7  30.0  32.4   Platelets 150 - 400 K/uL 267  254  274     ABG    Component Value Date/Time   PHART 7.400 03/10/2022 1315   PCO2ART 45.7 03/10/2022 1315   PO2ART 74 (L) 03/10/2022 1315   HCO3 17.6 (L) 04/17/2022 1054   TCO2 30 03/10/2022 1315   ACIDBASEDEF 10.1 (H) 04/17/2022 1054   O2SAT 68.2 04/17/2022 1054    CBG (last 3)  Recent Labs    04/17/22 0723 04/17/22 1117 04/17/22 1608  GLUCAP 133* 124* 107*     Nothing else to add at this point, looks terminally ill to  me.   Christinia Gully, MD Pulmonary and [place] [phone]   After 7:00 pm call Elink  [phone]

## 2022-04-17 NOTE — Plan of Care (Signed)
Neurology plan of care  MRI brain showed NAICP. OK to continue eliquis. No further stroke/TIA workup indicated.  Su Monks, MD [facility] [phone]  If 7pm- 7am, please page neurology on call as listed in [place].

## 2022-04-17 NOTE — Consult Note (Signed)
NEUROLOGY TELECONSULTATION NOTE   Date of service: April 17, 2022 Patient Name: Edward [name] MRN:  [id number] DOB:  10/24/1934 Reason for consult: telestroke  Requesting Provider: Dr. Heath Lark Consult Participants: myself, bedside RN, telestroke RN, patient Location of the provider: [facility] Location of the patient: [facility]  This consult was provided via telemedicine with 2-way video and audio communication. The patient/family was informed that care would be provided in this way and agreed to receive care in this manner.   _ _ _   _ __   _ __ _ _  __ __   _ __   __ _  History of Present Illness   This is an 87 year old man with a past medical history significant for A-fib on Eliquis, hypertension, hyperlipidemia, pulmonary fibrosis and COPD on home oxygen 3L [place] on whom stroke code is called for encephalopathy and generalized weakness.  He was recently discharged on 6/13 after treatment for acute COPD exacerbation and metabolic encephalopathy in the setting of pneumonia.  Patient is unable to give history therefore HPI obtained from chart review and bedside nurse.  He was discharged 4 days ago with a regimen of oral antibiotics and steroid taper but he did not pick up those medications.  He was readmitted with acute COPD exacerbation in setting of persistent pneumonia.  He is being treated with vancomycin and cefepime.  Last known well was 8 PM last night.  At shift change this morning patient was noted to be asleep.  When he was awakened at approximately 9 AM he was noted to be encephalopathic and had difficulty holding extremities up without jerking.  Patient was outside the window for thrombolytics.  CT head showed no acute intracranial process on personal review. He has worsening AKI creatinine 1.46 two days ago to 2.65 today and uremia 49 -> 89 same time frame.   ROS   UTA 2/2 encephalopathy  Past History   The following was personally reviewed:  Past Medical History:  Diagnosis Date    Allergy    Rhinitis   Atrial fibrillation ([name])    Bronchitis    Chronic respiratory failure ([name])    Colon polyps    COPD (chronic obstructive pulmonary disease) ([name])    Diabetes mellitus    Hypercholesterolemia    Hypertension    Iron deficiency anemia due to chronic blood loss 04/30/2018   Noncompliance    On home O2    2L N/C    PSA elevation    Pulmonary fibrosis ([name])    Vitamin D deficiency    Past Surgical History:  Procedure Laterality Date   BIOPSY  04/23/2018   Procedure: BIOPSY;  Surgeon: Danie Binder, MD;  Location: [facility];  Service: Endoscopy;;  duodenum gastric   CATARACT EXTRACTION W/PHACO  06/25/2012   Procedure: CATARACT EXTRACTION PHACO AND INTRAOCULAR LENS PLACEMENT ([place]);  Surgeon: Elta Guadeloupe T. Gershon Crane, MD;  Location: [facility] ORS;  Service: Ophthalmology;  Laterality: Left;  CDE=19.01   CATARACT EXTRACTION W/PHACO  07/09/2012   Procedure: CATARACT EXTRACTION PHACO AND INTRAOCULAR LENS PLACEMENT (IOC);  Surgeon: Elta Guadeloupe T. Gershon Crane, MD;  Location: [facility] OR[facility];  Service: Ophthalmology;  Laterality: Right;  CDE: 20.09   COLONOSCOPY WITH PROPOFOL N/A 04/23/2018   Procedure: COLONOSCOPY WITH PROPOFOL;  Surgeon: Danie Binder, MD;  Location: [facility];  Service: Endoscopy;  Laterality: N/A;   ESOPHAGOGASTRODUODENOSCOPY (EGD) WITH PROPOFOL N/A 04/23/2018   Procedure: ESOPHAGOGASTRODUODENOSCOPY (EGD) WITH PROPOFOL;  Surgeon: Danie Binder, MD;  Location:  [facility];  Service: Endoscopy;  Laterality: N/A;   Family History  Problem Relation Age of Onset   Heart disease Mother    CAD Other    Diabetes Other    Social History   Socioeconomic History   Marital status: Married    Spouse name: [name]   Number of children: 2   Years of education: Not on file   Highest education level: Not on file  Occupational History   Occupation: [employer]   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 Married 50+ years.   Grown twin sons.    Social Determinants of Health   Financial Resource Strain: Low Risk  (12/16/2021)   Overall Financial Resource Strain (CARDIA)    Difficulty of Paying Living Expenses: Not hard at all  Food Insecurity: No Food Insecurity (12/16/2021)   Hunger Vital Sign    Worried About Running Out of Food in the Last Year: Never true    Ran Out of Food in the Last Year: Never true  Transportation Needs: No Transportation Needs (12/16/2021)   PRAPARE - Hydrologist (Medical): No    Lack of Transportation (Non-Medical): No  Physical Activity: Inactive (12/16/2021)   Exercise Vital Sign    Days of Exercise per Week: 0 days    Minutes of Exercise per Session: 0 min  Stress: No Stress Concern Present (12/16/2021)   [place]    Feeling of Stress : Not at all  Social Connections: [place] (12/16/2021)   Social Connection and Isolation Panel [NHANES]    Frequency of Communication with Friends and Family: More than three times a week    Frequency of Social Gatherings with Friends and Family: More than three times a week    Attends Religious Services: More than 4 times per year    Active Member of [employer] or Organizations: Yes    Attends Music therapist: More than 4 times per year    Marital Status: Married   Allergies  Allergen Reactions   Ace Inhibitors Other (See Comments)    Hyperkalemia--07/23/2013:patient states not familiar with the following allergy    Medications   Medications Prior  to Admission  Medication Sig Dispense Refill Last Dose   albuterol (PROVENTIL) (2.5 MG/3ML) 0.083% nebulizer solution INHALE 3ML BY NEBULIZATION ROUTE EVERY 6 HOURS AS NEEDED FOR WHEEZING OR SHORTNESS OF BREATH (Patient taking differently: Take 2.5 mg by nebulization every 6 (six) hours as needed for shortness of breath.) 3 mL 3 04/14/2022   apixaban (ELIQUIS) 2.5 MG TABS tablet Take 1 tablet (2.5 mg total) by mouth 2 (two) times daily. (Patient taking differently: Take 5 mg by mouth 2 (two) times daily.) 60 tablet 2 04/14/2022 at 0600   cloNIDine (CATAPRES) 0.1 MG tablet Take 0.1 mg by mouth 2 (two) times daily.   04/14/2022   fluticasone-salmeterol (WIXELA INHUB)  500-50 MCG/ACT AEPB Inhale 1 puff into the lungs in the morning and at bedtime. This replaces symbicort, 3 each 3 04/14/2022   furosemide (LASIX) 40 MG tablet TAKE 1 TABLET BY MOUTH DAILY 30 tablet 10 04/14/2022   insulin glargine (LANTUS SOLOSTAR) 100 UNIT/ML Solostar Pen Inject 15 Units into the skin at bedtime. (Patient taking differently: Inject 15 Units into the skin See admin instructions. If sugar is elevated can go as high as 30 units at bedtime) 15 mL 11 04/13/2022   metoprolol tartrate (LOPRESSOR) 25 MG tablet TAKE 1 TABLET BY MOUTH TWICE DAILY (Patient taking differently: Take 25 mg by mouth 2 (two) times daily.) 60 tablet 3 04/14/2022 at 0600   OXYGEN Inhale 3 L into the lungs continuous.       pioglitazone (ACTOS) 30 MG tablet Take 1 tablet (30 mg total) by mouth daily. 30 tablet 3 04/14/2022   potassium chloride SA (KLOR-CON) 20 MEQ tablet Take 1 tablet by mouth once daily 90 tablet 0 04/14/2022   pravastatin (PRAVACHOL) 80 MG tablet TAKE 1 TABLET BY MOUTH AT BEDTIME (Patient taking differently: Take 80 mg by mouth at bedtime.) 30 tablet 3 04/13/2022   pregabalin (LYRICA) 100 MG capsule Take 1 capsule (100 mg total) by mouth 2 (two) times daily. 60 capsule 3 04/14/2022   tiotropium (SPIRIVA HANDIHALER) 18 MCG inhalation capsule Place 1  capsule (18 mcg total) into inhaler and inhale daily. 90 capsule 3 04/14/2022   glucose blood (ONETOUCH ULTRA) test strip CHECK FASTING BLOOD SUGAR TWICE DAILY 50 strip 3    Lancets (ONETOUCH DELICA PLUS [id number]) MISC USE TO CHECK BLOOD SUGAR TWICE DAILY AS DIRECTED 100 each 0       Current Facility-Administered Medications:    acetaminophen (TYLENOL) tablet 650 mg, 650 mg, Oral, Q6H PRN **OR** acetaminophen (TYLENOL) suppository 650 mg, 650 mg, Rectal, Q6H PRN, [name], Pratik D, DO   albuterol (PROVENTIL) (2.5 MG/3ML) 0.083% nebulizer solution 2.5 mg, 2.5 mg, Nebulization, Q4H PRN, Chesley Mires, MD   apixaban (ELIQUIS) tablet 2.5 mg, 2.5 mg, Oral, BID, Hall, Scott A, RPH, 2.5 mg at 04/17/22 0854   arformoterol (BROVANA) nebulizer solution 15 mcg, 15 mcg, Nebulization, BID, Halford Chessman, Vineet, MD, 15 mcg at 04/17/22 0750   budesonide (PULMICORT) nebulizer solution 0.25 mg, 0.25 mg, Nebulization, BID, Halford Chessman, Vineet, MD, 0.25 mg at 04/17/22 0750   ceFEPIme (MAXIPIME) 2 g in sodium chloride 0.9 % 100 mL IVPB, 2 g, Intravenous, Q24H, Hall, Scott A, RPH   cloNIDine (CATAPRES) tablet 0.1 mg, 0.1 mg, Oral, BID, [name], Pratik D, DO, 0.1 mg at 04/17/22 0855   guaiFENesin-dextromethorphan (ROBITUSSIN DM) 100-10 MG/5ML syrup 5 mL, 5 mL, Oral, Q4H PRN, [name], Pratik D, DO   insulin aspart (novoLOG) injection 0-15 Units, 0-15 Units, Subcutaneous, TID WC, Shah, Pratik D, DO, 3 Units at 04/16/22 1717   insulin aspart (novoLOG) injection 0-5 Units, 0-5 Units, Subcutaneous, QHS, Shah, Pratik D, DO, 2 Units at 04/16/22 2122   insulin glargine-yfgn (SEMGLEE) injection 15 Units, 15 Units, Subcutaneous, Daily, Heath Lark D, DO, 15 Units at 04/16/22 0850   methylPREDNISolone sodium succinate (SOLU-MEDROL) 40 mg/mL injection 40 mg, 40 mg, Intravenous, Q12H, Shah, Pratik D, DO, 40 mg at 04/16/22 2122   metoprolol tartrate (LOPRESSOR) tablet 25 mg, 25 mg, Oral, BID, Shah, Pratik D, DO, 25 mg at 04/17/22 0855   ondansetron  (ZOFRAN) tablet 4 mg, 4 mg, Oral, Q6H PRN **OR** ondansetron (ZOFRAN) injection 4 mg, 4 mg, Intravenous, Q6H PRN, [name], Pratik D,  DO   pioglitazone (ACTOS) tablet 30 mg, 30 mg, Oral, Daily, [name], Pratik D, DO, 30 mg at 04/16/22 1031   potassium chloride SA (KLOR-CON M) CR tablet 20 mEq, 20 mEq, Oral, Daily, [name], Pratik D, DO, 20 mEq at 04/17/22 0854   pravastatin (PRAVACHOL) tablet 80 mg, 80 mg, Oral, QHS, Shah, Pratik D, DO, 80 mg at 04/16/22 2121   revefenacin (YUPELRI) nebulizer solution 175 mcg, 175 mcg, Nebulization, Daily, Chesley Mires, MD, 175 mcg at 04/17/22 0755  Vitals   Vitals:   04/16/22 2006 04/17/22 0501 04/17/22 0755 04/17/22 0855  BP: (!) 123/55 113/62  139/88  Pulse: (!) 58 (!) 58  66  Resp: '18 18  18  '$ Temp: 97.8 F (36.6 C) (!) 97.5 F (36.4 C)    TempSrc: Oral Oral    SpO2: 98% 100% 96% 97%  Weight:      Height:         Body mass index is 23.44 kg/m.  Physical Exam   Exam performed over telemedicine with 2-way video and audio communication and with assistance of bedside RN  Physical Exam Gen: alert, oriented to self and hospital as well as age but not time Resp: normal WOB CV: extremities appear well-perfused  Neuro: *MS: alert, oriented to self and hospital as well as age but not time. Difficulty following simple commands *Speech: moderate dysarthria, no aphasia *CN: PERRL 66m, EOMI, blinks to threat bilat,smile symmetric, hearing intact to voice *Motor:   Normal bulk.  Drift in all extremities to bed. Prominent asterixis BUE. *Sensory: SILT. Symmetric.  *Coordination:  UTA 2/2 encephalopathy *Reflexes:  UTA 2/2 tele-exam *Gait: deferred  NIHSS  1a Level of Conscious.: 0 1b LOC Questions: 1 1c LOC Commands: 1 2 Best Gaze: 0 3 Visual: 0 4 Facial Palsy: 0 5a Motor Arm - left: 2 5b Motor Arm - Right: 2 6a Motor Leg - Left: 2 6b Motor Leg - Right: 2 7 Limb Ataxia: 0 8 Sensory: 0 9 Best Language: 0 10 Dysarthria: 2 11 Extinct. and Inatten.:  0  TOTAL: 12   Premorbid mRS = 3   Labs   CBC:  Recent Labs  Lab 04/14/22 0743 04/15/22 0653 04/16/22 0518 04/17/22 0435  WBC 7.0   < > 13.1* 11.8*  NEUTROABS 4.3  --   --   --   HGB 11.9*   < > 9.4* 10.0*  HCT 37.1*   < > 30.0* 31.7*  MCV 87.3   < > 88.5 88.8  PLT 272   < > 254 267   < > = values in this interval not displayed.    Basic Metabolic Panel:  Lab Results  Component Value Date   NA 134 (L) 04/17/2022   K 4.8 04/17/2022   CO2 18 (L) 04/17/2022   GLUCOSE 147 (H) 04/17/2022   BUN 89 (H) 04/17/2022   CREATININE 2.65 (H) 04/17/2022   CALCIUM 7.8 (L) 04/17/2022   GFRNONAA 23 (L) 04/17/2022   GFRAA 55 (L) 03/30/2021   Lipid Panel:  Lab Results  Component Value Date   [facility]     Comment:     . LDL cholesterol not calculated. Triglyceride levels greater than 400 mg/dL invalidate calculated LDL results. . Reference range: <100 . Desirable range <100 mg/dL for primary prevention;   <70 mg/dL for patients with CHD or diabetic patients  with > or = 2 CHD risk factors. .Marland KitchenLDL-C is now calculated using the Martin-Hopkins  calculation, which is a  validated novel method providing  better accuracy than the Friedewald equation in the  estimation of LDL-C.  Cresenciano Genre et al. Annamaria Helling. 3557;322(02): 2061-2068  (http://education.[facility].com/faq/FAQ164)    HgbA1c:  Lab Results  Component Value Date   HGBA1C 13.6 (H) 03/10/2022   Urine Drug Screen:     Component Value Date/Time   LABOPIA NONE DETECTED 07/07/2021 2235   COCAINSCRNUR NONE DETECTED 07/07/2021 2235   LABBENZ NONE DETECTED 07/07/2021 2235   AMPHETMU NONE DETECTED 07/07/2021 2235   THCU NONE DETECTED 07/07/2021 2235   LABBARB NONE DETECTED 07/07/2021 2235    Alcohol Level     Component Value Date/Time   ETH <10 07/07/2021 1822     Impression   This is an 87 year old man with a past medical history significant for A-fib on Eliquis, hypertension, hyperlipidemia,  pulmonary fibrosis and COPD on home oxygen 3L [place] on whom stroke code is called for encephalopathy and generalized weakness.  Last known well was 8 PM last night.  His CT head was normal. Last known well was yesterday evening at 8pm so outside the window for thrombolytics. His exam is nonfocal and my suspicion for stroke as etiology of current sx is low. He is unable to hold arms up for count of 10 sec bilaterally but this appears at least on video to be due at least in part to asterixis. His GFR is 20 and exam not c/w LVO so instead of doing CTA/CTP we will do STAT MRI/MRA. Encephalopathy is likely multifactorial 2/2 infection, uremia, drug effect from cefepime.  Recommendations   - MRI brain wo contrast - MRA H&N without contrast - If there is a reasonable alternative to cefepime I would recommend changing to a different antibiotic since cefepime commonly causes encephalopathy particularly in the elderly. - Further encephalopathy workup per primary team; I would consider hepatic function panel, ammonia, ABG, and EEG - Avoid deliriogenic medications - I will be available for f/u of his MRI and any other questions today. If further assistance from neuro is needed after today please place a routine teleconsult to Dr. Hortense Ramal (in [facility])  Any copied and pasted documentation in this note was written by me in another application (not billed for separately) and pasted here.  ______________________________________________________________________   Thank you for the opportunity to take part in the care of this patient. If you have any further questions, please contact the neurology consultation attending.  Signed,  Su Monks, MD [facility] [phone]  If 7pm- 7am, please page neurology on call as listed in [employer].

## 2022-04-17 NOTE — Progress Notes (Addendum)
2244: Code stroke activated via cart at this time. Patient also in CT at this time.   MRS: 3  LWK: 04/16/2022 at 2000  Recognition of s/s: 0800   0920: Page to [facility] Neurology Dr. Quinn Axe sent.

## 2022-04-17 NOTE — Progress Notes (Signed)
PROGRESS NOTE    Edward [name]  MEQ:[id number] DOB: 22-Jul-1935 DOA: 04/14/2022 PCP: Susy Frizzle, MD   Brief Narrative:    Edward [name] is a 87 y.o. male with medical history significant for COPD and chronic hypoxemic respiratory failure on 3 L nasal cannula who was recently discharged on 6/13 after treatment for acute COPD exacerbation and metabolic encephalopathy in the setting of pneumonia.  He was discharged with a regimen of oral antibiotics and steroid taper, but it does not appear that he actually picked up these medications.  He states that he has had some persistent dyspnea that has been worsening along with a nonproductive cough for the last 2-3 weeks.  He was readmitted with acute COPD exacerbation in the setting of persistent pneumonia.  He is also now noted to have worsening AKI while being treated with vancomycin.  He was noted to have 1 blood culture positive with MRSA.  On 7/17 he was noted to be more confused and lethargic and have a code stroke called, but there was no acute CVA noted.  He was noted to have some worsening encephalopathy.  Assessment & Plan:   Principal Problem:   COPD with acute exacerbation ([place]) Active Problems:   Hypertension   DM type 2 causing vascular disease ([name])   Chronic diastolic CHF (congestive heart failure) ([name])   AF (paroxysmal atrial fibrillation) ([name])   Hyponatremia  Assessment and Plan:   Acute COPD exacerbation with concern for persistent pneumonia -Continue IV Solu-Medrol twice daily as ordered, decrease to 40 mg twice daily -DuoNebs as needed for shortness of breath or wheezing -Antitussives -Continue vancomycin and cefepime empirically for HCAP and check MRSA screen -Follow lab work -Currently on baseline 3 L nasal cannula oxygen -Consult to palliative care per PCCM recommendations, pending  Acute metabolic encephalopathy -PCO2 and ammonia within normal limits -Code stroke called 7/17 with head CT and brain MRI  with no acute findings -Likely due to Lyrica use in the setting of AKI and this has not been discontinued -Continue to monitor closely for improvement   MRSA bacteremia-unlikely -Repeat blood cultures negative -Vancomycin held for now given AKI   History of paroxysmal atrial fibrillation -Continue Eliquis at home and metoprolol for heart rate control   Chronic diastolic CHF -2D echocardiogram 1 month ago with EF 60-65% and grade 2 diastolic dysfunction -Hold Lasix for now and give gentle IV fluid given AKI   AKI on CKD stage IIIb -Likely ATN due to vancomycin toxicity -Renal ultrasound with no acute findings -Continue IV fluid and monitor strict I's and O's -Repeat a.m. labs   Mild hyponatremia -Continue normal saline -Monitor repeat labs in a.m. -Urine sodium   Normocytic anemia -Continue to monitor   Hypertension -Continue home medications and monitor   Type 2 diabetes -A1c 13.6% on 6/9 -SSI coverage especially with steroids   Deconditioning/debility -Home health PT     DVT prophylaxis: Eliquis Code Status: Full Family Communication: Discussed with son on phone 7/17 Disposition Plan:  Status is: Inpatient Remains inpatient appropriate because: Continues to require IV medications/antibiotics     Consultants:  PCCM Palliative   Procedures:  None   Antimicrobials:  Anti-infectives (From admission, onward)    Start     Dose/Rate Route Frequency Ordered Stop   04/17/22 1030  Ampicillin-Sulbactam (UNASYN) 3 g in sodium chloride 0.9 % 100 mL IVPB        3 g 200 mL/hr over 30 Minutes Intravenous Every 12 hours 04/17/22 0957  04/17/22 1000  ceFEPIme (MAXIPIME) 2 g in sodium chloride 0.9 % 100 mL IVPB  Status:  Discontinued        2 g 200 mL/hr over 30 Minutes Intravenous Every 24 hours 04/16/22 1229 04/17/22 0947   04/14/22 2200  vancomycin (VANCOREADY) IVPB 750 mg/150 mL  Status:  Discontinued        750 mg 150 mL/hr over 60 Minutes Intravenous Every 12  hours 04/14/22 1151 04/16/22 1144   04/14/22 1015  ceFEPIme (MAXIPIME) 2 g in sodium chloride 0.9 % 100 mL IVPB  Status:  Discontinued        2 g 200 mL/hr over 30 Minutes Intravenous Every 12 hours 04/14/22 1009 04/16/22 1229   04/14/22 1015  vancomycin (VANCOREADY) IVPB 1500 mg/300 mL        1,500 mg 150 mL/hr over 120 Minutes Intravenous  Once 04/14/22 1013 04/14/22 1410       Subjective: Patient seen and evaluated today with increasing lethargy and confusion.  Some wheezing noted as well.  Objective: Vitals:   04/16/22 2006 04/17/22 0501 04/17/22 0755 04/17/22 0855  BP: (!) 123/55 113/62  139/88  Pulse: (!) 58 (!) 58  66  Resp: '18 18  18  '$ Temp: 97.8 F (36.6 C) (!) 97.5 F (36.4 C)    TempSrc: Oral Oral    SpO2: 98% 100% 96% 97%  Weight:      Height:        Intake/Output Summary (Last 24 hours) at 04/17/2022 1407 Last data filed at 04/16/2022 1900 Gross per 24 hour  Intake 533.75 ml  Output 250 ml  Net 283.75 ml   Filed Weights   04/14/22 0727 04/14/22 1440  Weight: 72.6 kg 72 kg    Examination:  General exam: Appears somewhat lethargic Respiratory system: Bilateral wheezing noted currently receiving breathing treatment Cardiovascular system: S1 & S2 heard, RRR.  Gastrointestinal system: Abdomen is soft Central nervous system: Alert and awake Extremities: No edema Skin: No significant lesions noted Psychiatry: Flat affect.    Data Reviewed: I have personally reviewed following labs and imaging studies  CBC: Recent Labs  Lab 04/14/22 0743 04/15/22 0653 04/16/22 0518 04/17/22 0435  WBC 7.0 9.4 13.1* 11.8*  NEUTROABS 4.3  --   --   --   HGB 11.9* 10.3* 9.4* 10.0*  HCT 37.1* 32.4* 30.0* 31.7*  MCV 87.3 87.8 88.5 88.8  PLT 272 274 254 875   Basic Metabolic Panel: Recent Labs  Lab 04/14/22 0743 04/15/22 0653 04/16/22 0518 04/17/22 0435  NA 133* 135 132* 134*  K 3.9 4.6 4.6 4.8  CL 100 103 103 106  CO2 26 25 20* 18*  GLUCOSE 231* 187* 192*  147*  BUN 40* 49* 72* 89*  CREATININE 1.13 1.46* 2.16* 2.65*  CALCIUM 8.8* 8.3* 8.0* 7.8*  MG  --  2.7* 2.7* 2.8*   GFR: Estimated Creatinine Clearance: 20.8 mL/min (A) (by C-G formula based on SCr of 2.65 mg/dL (H)). Liver Function Tests: Recent Labs  Lab 04/14/22 0743 04/17/22 0920  AST 15 11*  ALT 12 11  ALKPHOS 74 55  BILITOT 0.3 0.4  PROT 7.4 6.4*  ALBUMIN 3.3* 2.8*   No results for input(s): "LIPASE", "AMYLASE" in the last 168 hours. Recent Labs  Lab 04/17/22 0920  AMMONIA 22   Coagulation Profile: No results for input(s): "INR", "PROTIME" in the last 168 hours. Cardiac Enzymes: No results for input(s): "CKTOTAL", "CKMB", "CKMBINDEX", "TROPONINI" in the last 168 hours. BNP (last 3 results)  No results for input(s): "PROBNP" in the last 8760 hours. HbA1C: No results for input(s): "HGBA1C" in the last 72 hours. CBG: Recent Labs  Lab 04/16/22 1157 04/16/22 1638 04/16/22 2052 04/17/22 0723 04/17/22 1117  GLUCAP 231* 176* 237* 133* 124*   Lipid Profile: No results for input(s): "CHOL", "HDL", "LDLCALC", "TRIG", "CHOLHDL", "LDLDIRECT" in the last 72 hours. Thyroid Function Tests: No results for input(s): "TSH", "T4TOTAL", "FREET4", "T3FREE", "THYROIDAB" in the last 72 hours. Anemia Panel: No results for input(s): "VITAMINB12", "FOLATE", "FERRITIN", "TIBC", "IRON", "RETICCTPCT" in the last 72 hours. Sepsis Labs: Recent Labs  Lab 04/14/22 1010  PROCALCITON 0.17    Recent Results (from the past 240 hour(s))  Culture, blood (Routine X 2) w Reflex to ID Panel     Status: None (Preliminary result)   Collection Time: 04/16/22  7:17 AM   Specimen: BLOOD LEFT HAND  Result Value Ref Range Status   Specimen Description   Final    BLOOD LEFT HAND BOTTLES DRAWN AEROBIC AND ANAEROBIC   Special Requests Blood Culture adequate volume  Final   Culture   Final    NO GROWTH < 24 HOURS Performed at [facility], [address] [place], [place] [address]    Report  Status PENDING  Incomplete  Culture, blood (Routine X 2) w Reflex to ID Panel     Status: None (Preliminary result)   Collection Time: 04/16/22  7:17 AM   Specimen: BLOOD RIGHT HAND  Result Value Ref Range Status   Specimen Description BLOOD RIGHT HAND BOTTLES DRAWN AEROBIC ONLY  Final   Special Requests Blood Culture adequate volume  Final   Culture   Final    NO GROWTH < 24 HOURS Performed at [facility], [address] [place], [place] [address]    Report Status PENDING  Incomplete         Radiology Studies: MR ANGIO HEAD WO CONTRAST  Result Date: 04/17/2022 CLINICAL DATA:  Neuro deficit, acute, stroke suspected. Encephalopathy and generalized weakness. EXAM: MRI HEAD WITHOUT CONTRAST MRA HEAD WITHOUT CONTRAST MRA NECK WITHOUT CONTRAST TECHNIQUE: Multiplanar, multiecho pulse sequences of the brain and surrounding structures were obtained without intravenous contrast. Angiographic images of the Circle of Willis were obtained using MRA technique without intravenous contrast. Angiographic images of the neck were obtained using MRA technique without intravenous contrast. Carotid stenosis measurements (when applicable) are obtained utilizing NASCET criteria, using the distal internal carotid diameter as the denominator. COMPARISON:  Head CT 04/17/2022.  Head MRI and MRA 09/26/2017. FINDINGS: MRI HEAD FINDINGS Multiple sequences are mildly to moderately motion degraded. Brain: There is no evidence of an acute infarct, intracranial hemorrhage, mass, midline shift, or extra-axial fluid collection. T2 hyperintensities in the cerebral white matter bilaterally are similar to the prior MRI and are nonspecific but compatible with mild chronic small vessel ischemic disease. There is moderate cerebral atrophy. Vascular: Major intracranial vascular flow voids are preserved. Skull and upper cervical spine: Unremarkable bone marrow signal. Sinuses/Orbits: Bilateral cataract extraction. Clear paranasal  sinuses. Small left mastoid effusion. Other: None. MRA HEAD FINDINGS The included portions of the intracranial vertebral arteries are widely patent to the basilar. Patent PICA and SCA origins are visualized bilaterally. The basilar artery is widely patent. Posterior communicating arteries are diminutive or absent. Both PCAs are patent with multifocal severe right P2 stenoses which are new from the 2018 MRA. There is up to mild left P2 stenosis. The internal carotid arteries are patent from skull base to carotid termini without definite  evidence of a high-grade stenosis, although motion artifact limits assessment. ACAs and MCAs are patent without evidence of a proximal branch occlusion or high-grade proximal stenosis. No aneurysm is identified. MRA NECK FINDINGS Evaluation is limited by extensive motion artifact and noncontrast technique. There is nondiagnostic assessment of the aortic arch, brachiocephalic and subclavian arteries, and proximal common carotid and vertebral arteries. Flow is present in the mid and distal common carotid arteries, cervical internal carotid arteries, and distal V1 through V4 segments of the vertebral arteries, however motion artifact precludes assessment for stenosis. IMPRESSION: 1. No acute intracranial abnormality. 2. Mild chronic small vessel ischemic disease and moderate cerebral atrophy. 3. Severely limited neck MRA due to motion artifact. Grossly patent cervical carotid and vertebral arteries. 4. No major intracranial arterial occlusion. Severe right P2 stenoses. Electronically Signed   By: Logan Bores M.D.   On: 04/17/2022 13:13   MR ANGIO NECK WO CONTRAST  Result Date: 04/17/2022 CLINICAL DATA:  Neuro deficit, acute, stroke suspected. Encephalopathy and generalized weakness. EXAM: MRI HEAD WITHOUT CONTRAST MRA HEAD WITHOUT CONTRAST MRA NECK WITHOUT CONTRAST TECHNIQUE: Multiplanar, multiecho pulse sequences of the brain and surrounding structures were obtained without  intravenous contrast. Angiographic images of the Circle of Willis were obtained using MRA technique without intravenous contrast. Angiographic images of the neck were obtained using MRA technique without intravenous contrast. Carotid stenosis measurements (when applicable) are obtained utilizing NASCET criteria, using the distal internal carotid diameter as the denominator. COMPARISON:  Head CT 04/17/2022.  Head MRI and MRA 09/26/2017. FINDINGS: MRI HEAD FINDINGS Multiple sequences are mildly to moderately motion degraded. Brain: There is no evidence of an acute infarct, intracranial hemorrhage, mass, midline shift, or extra-axial fluid collection. T2 hyperintensities in the cerebral white matter bilaterally are similar to the prior MRI and are nonspecific but compatible with mild chronic small vessel ischemic disease. There is moderate cerebral atrophy. Vascular: Major intracranial vascular flow voids are preserved. Skull and upper cervical spine: Unremarkable bone marrow signal. Sinuses/Orbits: Bilateral cataract extraction. Clear paranasal sinuses. Small left mastoid effusion. Other: None. MRA HEAD FINDINGS The included portions of the intracranial vertebral arteries are widely patent to the basilar. Patent PICA and SCA origins are visualized bilaterally. The basilar artery is widely patent. Posterior communicating arteries are diminutive or absent. Both PCAs are patent with multifocal severe right P2 stenoses which are new from the 2018 MRA. There is up to mild left P2 stenosis. The internal carotid arteries are patent from skull base to carotid termini without definite evidence of a high-grade stenosis, although motion artifact limits assessment. ACAs and MCAs are patent without evidence of a proximal branch occlusion or high-grade proximal stenosis. No aneurysm is identified. MRA NECK FINDINGS Evaluation is limited by extensive motion artifact and noncontrast technique. There is nondiagnostic assessment of the  aortic arch, brachiocephalic and subclavian arteries, and proximal common carotid and vertebral arteries. Flow is present in the mid and distal common carotid arteries, cervical internal carotid arteries, and distal V1 through V4 segments of the vertebral arteries, however motion artifact precludes assessment for stenosis. IMPRESSION: 1. No acute intracranial abnormality. 2. Mild chronic small vessel ischemic disease and moderate cerebral atrophy. 3. Severely limited neck MRA due to motion artifact. Grossly patent cervical carotid and vertebral arteries. 4. No major intracranial arterial occlusion. Severe right P2 stenoses. Electronically Signed   By: Logan Bores M.D.   On: 04/17/2022 13:13   MR BRAIN WO CONTRAST  Result Date: 04/17/2022 CLINICAL DATA:  Neuro deficit, acute, stroke suspected.  Encephalopathy and generalized weakness. EXAM: MRI HEAD WITHOUT CONTRAST MRA HEAD WITHOUT CONTRAST MRA NECK WITHOUT CONTRAST TECHNIQUE: Multiplanar, multiecho pulse sequences of the brain and surrounding structures were obtained without intravenous contrast. Angiographic images of the Circle of Willis were obtained using MRA technique without intravenous contrast. Angiographic images of the neck were obtained using MRA technique without intravenous contrast. Carotid stenosis measurements (when applicable) are obtained utilizing NASCET criteria, using the distal internal carotid diameter as the denominator. COMPARISON:  Head CT 04/17/2022.  Head MRI and MRA 09/26/2017. FINDINGS: MRI HEAD FINDINGS Multiple sequences are mildly to moderately motion degraded. Brain: There is no evidence of an acute infarct, intracranial hemorrhage, mass, midline shift, or extra-axial fluid collection. T2 hyperintensities in the cerebral white matter bilaterally are similar to the prior MRI and are nonspecific but compatible with mild chronic small vessel ischemic disease. There is moderate cerebral atrophy. Vascular: Major intracranial  vascular flow voids are preserved. Skull and upper cervical spine: Unremarkable bone marrow signal. Sinuses/Orbits: Bilateral cataract extraction. Clear paranasal sinuses. Small left mastoid effusion. Other: None. MRA HEAD FINDINGS The included portions of the intracranial vertebral arteries are widely patent to the basilar. Patent PICA and SCA origins are visualized bilaterally. The basilar artery is widely patent. Posterior communicating arteries are diminutive or absent. Both PCAs are patent with multifocal severe right P2 stenoses which are new from the 2018 MRA. There is up to mild left P2 stenosis. The internal carotid arteries are patent from skull base to carotid termini without definite evidence of a high-grade stenosis, although motion artifact limits assessment. ACAs and MCAs are patent without evidence of a proximal branch occlusion or high-grade proximal stenosis. No aneurysm is identified. MRA NECK FINDINGS Evaluation is limited by extensive motion artifact and noncontrast technique. There is nondiagnostic assessment of the aortic arch, brachiocephalic and subclavian arteries, and proximal common carotid and vertebral arteries. Flow is present in the mid and distal common carotid arteries, cervical internal carotid arteries, and distal V1 through V4 segments of the vertebral arteries, however motion artifact precludes assessment for stenosis. IMPRESSION: 1. No acute intracranial abnormality. 2. Mild chronic small vessel ischemic disease and moderate cerebral atrophy. 3. Severely limited neck MRA due to motion artifact. Grossly patent cervical carotid and vertebral arteries. 4. No major intracranial arterial occlusion. Severe right P2 stenoses. Electronically Signed   By: Logan Bores M.D.   On: 04/17/2022 13:13   CT HEAD CODE STROKE WO CONTRAST  Result Date: 04/17/2022 CLINICAL DATA:  Code stroke.  Neuro deficit, acute, stroke suspected EXAM: CT HEAD WITHOUT CONTRAST TECHNIQUE: Contiguous axial  images were obtained from the base of the skull through the vertex without intravenous contrast. RADIATION DOSE REDUCTION: This exam was performed according to the departmental dose-optimization program which includes automated exposure control, adjustment of the mA and/or kV according to patient size and/or use of iterative reconstruction technique. COMPARISON:  CT head July 07, 2021. FINDINGS: Brain: No evidence of acute large vascular territory infarction, hemorrhage, hydrocephalus, extra-axial collection or mass lesion/mass effect. Similar chronic microvascular disease and cerebral atrophy. Vascular: No hyperdense vessel identified. Skull: No acute fracture. Sinuses/Orbits: Left maxillary sinus mucosal thinking. No acute orbital findings Other: None. ASPECTS [facility] Stroke Program Early CT Score) total score (0-10 with 10 being normal): 10. IMPRESSION: 1. No evidence of acute intracranial abnormality. 2. ASPECTS is 10. Code stroke imaging results were communicated on 04/17/2022 at 9:35 am to provider Apolonio Schneiders via telephone, who verbally acknowledged these results. Electronically Signed   By: Roslynn Amble  Ronnald Ramp M.D.   On: 04/17/2022 09:37   US RENAL  Result Date: 04/16/2022 CLINICAL DATA:  Acute renal insufficiency EXAM: RENAL / URINARY TRACT ULTRASOUND COMPLETE COMPARISON:  None Available. FINDINGS: Right Kidney: Renal measurements: 9.3 x 5.6 x 4.8 cm = volume: 131 mL. Increased cortical echogenicity. Left Kidney: Renal measurements: 9.6 x 4.7 x 4.4 cm = volume: 104.4 mL. Increased cortical echogenicity. Bladder: Appears normal for degree of bladder distention. Other: None. IMPRESSION: Medical renal disease with increased cortical echogenicity. No acute abnormalities. Electronically Signed   By: Dorise Bullion III M.D.   On: 04/16/2022 12:04        Scheduled Meds:  apixaban  2.5 mg Oral BID   arformoterol  15 mcg Nebulization BID   budesonide (PULMICORT) nebulizer solution  0.25 mg Nebulization BID    cloNIDine  0.1 mg Oral BID   insulin aspart  0-15 Units Subcutaneous TID WC   insulin aspart  0-5 Units Subcutaneous QHS   insulin glargine-yfgn  15 Units Subcutaneous Daily   methylPREDNISolone (SOLU-MEDROL) injection  40 mg Intravenous Q12H   metoprolol tartrate  25 mg Oral BID   pioglitazone  30 mg Oral Daily   potassium chloride SA  20 mEq Oral Daily   pravastatin  80 mg Oral QHS   revefenacin  175 mcg Nebulization Daily   Continuous Infusions:  ampicillin-sulbactam (UNASYN) IV 3 g (04/17/22 1137)     LOS: 3 days    Time spent: 35 minutes    Irving Lubbers Darleen Crocker, DO [facility]  If 7PM-7AM, please contact night-coverage [email or web address] 04/17/2022, 2:07 PM

## 2022-04-17 NOTE — Consult Note (Signed)
Consultation Note Date: 04/17/2022   Patient Name: Edward [name]  DOB: Aug 27, 1935  MRN: [id number]  Age / Sex: 87 y.o., male  PCP: Susy Frizzle, MD Referring Physician: Rodena Goldmann, DO  Reason for Consultation: Establishing goals of care  HPI/Patient Profile: 87 y.o. male  with past medical history of COPD on 3L chronically, pulmonary fibrosis, HFpEF, atrial fibrillation, HTN, HLD, iron deficiency anemia, CKD stage 3b, diabetes, recent admission for COPD exacerbation and pneumonia 6/9-6/13 admitted on 04/14/2022 with dyspnea. Mental status change and code stroke called 7/17. MRI head negative for stroke.   Clinical Assessment and Goals of Care: I met today at Edward [name]'s bedside after reviewing Epic notes from hospitalist/nurses/neurology/pulmonology, MRI head and diagnostics including BMET, and speaking briefly with Dr. Manuella [name]. [name] has no family at bedside. He is very lethargic but awakens to voice and greets me. I attempted to ask him questions and speak with him - he struggles to keep his eyes open more than a few seconds. He attempts to mumble response that I am unable to understand. I told him that I was going to call his son, Edward [name], and he does respond "okay."   I called and spoke with son, Edward [name]. I was unable to reach him at first and unable to leave voicemail. I spoke with Edward [name] about his father's worsening condition today. I expressed concern for his overall declining health and recurrent hospitalizations. I asked Edward [name] about his father's status at home between admissions. He reports that his father was able to care for himself, eating well, and even driving. He reports that his father is forgetful at times. No falls that he is aware of. He does report that his father was often driving to his brother's home (helping him move?) and would not use his oxygen. Edward [name] reports that he believes his father not using  his oxygen as recommended has led to his decline and rehospitalization - I agree. I discussed with Edward [name] that MRI showed no signs of stroke and we are worried that his father's declined kidney function is causing his mental status changes. I asked Edward [name] about any conversation regarding his father's significant health conditions and his wishes. I discussed code status. Edward [name] seems very overwhelmed and he reports that they have never discussed anything like this and he does not know what his father would want. I encouraged that these decisions are very important to consider and discuss if possible.   Edward [name] is unsure if they will be coming to bedside tomorrow. We discussed that we will continue this conversation in person or over the phone tomorrow. I expressed that we will have to have more serious discussion tomorrow if his father is not showing at least a little improvement. Edward [name] expresses understanding.   All questions/concerns addressed. Emotional support provided.   Primary Decision Maker Spouse - Edward [name] although son Edward [name] is main contact and main caregiver. He lives with his parents and helps care for them. He reports that his mother is in reasonable health.  SUMMARY OF RECOMMENDATIONS   - Ongoing goals of care conversations needed  Code Status/Advance Care Planning: Full code - plan to discuss further   Symptom Management:  Per attending.   Prognosis:  Overall prognosis poor.   Discharge Planning: To Be Determined      Primary Diagnoses: Present on Admission:  COPD with acute exacerbation ([name])  AF (paroxysmal atrial fibrillation) ([name])  Chronic diastolic CHF (congestive heart failure) ([place])  DM type 2 causing vascular disease ([place])  Hypertension  Hyponatremia   I have reviewed the medical record, interviewed the patient and family, and examined the patient. The following aspects are pertinent.  Past Medical History:  Diagnosis Date   Allergy    Rhinitis   Atrial  fibrillation ([name])    Bronchitis    Chronic respiratory failure ([name])    Colon polyps    COPD (chronic obstructive pulmonary disease) ([name])    Diabetes mellitus    Hypercholesterolemia    Hypertension    Iron deficiency anemia due to chronic blood loss 04/30/2018   Noncompliance    On home O2    2L N/C    PSA elevation    Pulmonary fibrosis ([name])    Vitamin D deficiency    Social History   Socioeconomic History   Marital status: Married    Spouse name: Edward [name]   Number of children: 2   Years of education: Not on file   Highest education level: Not on file  Occupational History   Occupation: [employer]   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 Married 50+ years.   Grown twin sons.    Social Determinants of Health   Financial Resource Strain: Low Risk  (12/16/2021)   Overall Financial Resource Strain (CARDIA)    Difficulty of Paying Living Expenses: Not hard at all  Food Insecurity: No Food Insecurity (12/16/2021)   Hunger Vital Sign    Worried About Running Out of Food in the Last Year: Never true    Ran Out of Food in the Last Year: Never true  Transportation Needs: No Transportation Needs (12/16/2021)   PRAPARE - Hydrologist (Medical): No    Lack of Transportation (Non-Medical): No  Physical Activity: Inactive (12/16/2021)   Exercise Vital Sign    Days of Exercise per Week: 0 days    Minutes of Exercise per Session: 0 min  Stress: No Stress Concern Present (12/16/2021)   [place]    Feeling of Stress : Not at all  Social Connections: [place] (12/16/2021)   Social Connection and Isolation Panel [NHANES]    Frequency of Communication with Friends and Family: More than three times a week    Frequency of Social Gatherings with Friends and Family: More than three times a week    Attends Religious Services: More than 4 times per year    Active Member  of Clubs or Organizations: Yes    Attends Music therapist: More than 4 times per year    Marital Status: Married   Family History  Problem Relation Age of Onset   Heart disease Mother    CAD Other    Diabetes Other    Scheduled Meds:  apixaban  2.5 mg Oral BID   arformoterol  15 mcg Nebulization BID   budesonide (PULMICORT) nebulizer solution  0.25 mg Nebulization BID   cloNIDine  0.1 mg Oral BID   insulin aspart  0-15 Units Subcutaneous TID WC   insulin aspart  0-5 Units Subcutaneous QHS   insulin glargine-yfgn  15 Units Subcutaneous Daily   methylPREDNISolone (SOLU-MEDROL) injection  40 mg Intravenous Q12H   metoprolol tartrate  25 mg Oral BID   pioglitazone  30 mg Oral Daily   potassium chloride SA  20 mEq Oral Daily   pravastatin  80 mg Oral QHS   revefenacin  175 mcg Nebulization Daily   Continuous Infusions:  ampicillin-sulbactam (UNASYN) IV     PRN Meds:.acetaminophen **OR** acetaminophen, albuterol, guaiFENesin-dextromethorphan, ondansetron **OR** ondansetron (ZOFRAN) IV Allergies  Allergen Reactions   Ace Inhibitors Other (See Comments)    Hyperkalemia--07/23/2013:patient states not familiar with the following allergy   Review of Systems  Unable to perform ROS: Acuity of condition    Physical Exam Vitals and nursing note reviewed.  Constitutional:      General: He is not in acute distress.    Appearance: He is ill-appearing.  Cardiovascular:     Rate and Rhythm: Normal rate.  Pulmonary:     Effort: No tachypnea, accessory  muscle usage or respiratory distress.  Abdominal:     Palpations: Abdomen is soft.  Neurological:     Mental Status: He is lethargic and confused.     Vital Signs: BP 139/88   Pulse 66   Temp (!) 97.5 F (36.4 C) (Oral)   Resp 18   Ht $R'5\' 9"'Fy$  (1.753 m)   Wt 72 kg   SpO2 97%   BMI 23.44 kg/m  Pain Scale: 0-10   Pain Score: 0-No pain   SpO2: SpO2: 97 % O2 Device:SpO2: 97 % O2 Flow Rate: .O2 Flow Rate (L/min): 2 L/min  IO: Intake/output summary:  Intake/Output Summary (Last 24 hours) at 04/17/2022 1059 Last data filed at 04/16/2022 1900 Gross per 24 hour  Intake 653.75 ml  Output 650 ml  Net 3.75 ml    LBM: Last BM Date : 04/16/22 Baseline Weight: Weight: 72.6 kg Most recent weight: Weight: 72 kg     Palliative Assessment/Data:     Time In: 1415  Time Total: 55 min  Greater than 50%  of this time was spent counseling and coordinating care related to the above assessment and plan.  Signed by: [name], NP Palliative Medicine Team Pager # [phone] (M-F 8a-5p) Team Phone # [phone] (Nights/Weekends)

## 2022-04-18 DIAGNOSIS — Z515 Encounter for palliative care: Secondary | ICD-10-CM | POA: Diagnosis not present

## 2022-04-18 DIAGNOSIS — Z7189 Other specified counseling: Secondary | ICD-10-CM | POA: Diagnosis not present

## 2022-04-18 DIAGNOSIS — J441 Chronic obstructive pulmonary disease with (acute) exacerbation: Secondary | ICD-10-CM | POA: Diagnosis not present

## 2022-04-18 LAB — COMPREHENSIVE METABOLIC PANEL
ALT: 11 U/L (ref 0–44)
AST: 12 U/L — ABNORMAL LOW (ref 15–41)
Albumin: 2.7 g/dL — ABNORMAL LOW (ref 3.5–5.0)
Alkaline Phosphatase: 52 U/L (ref 38–126)
Anion gap: 10 (ref 5–15)
BUN: 98 mg/dL — ABNORMAL HIGH (ref 8–23)
CO2: 19 mmol/L — ABNORMAL LOW (ref 22–32)
Calcium: 8 mg/dL — ABNORMAL LOW (ref 8.9–10.3)
Chloride: 109 mmol/L (ref 98–111)
Creatinine, Ser: 2.49 mg/dL — ABNORMAL HIGH (ref 0.61–1.24)
GFR, Estimated: 25 mL/min — ABNORMAL LOW (ref >60)
Glucose, Bld: 49 mg/dL — ABNORMAL LOW (ref 70–99)
Potassium: 4.5 mmol/L (ref 3.5–5.1)
Sodium: 138 mmol/L (ref 135–145)
Total Bilirubin: 0.5 mg/dL (ref 0.3–1.2)
Total Protein: 6.4 g/dL — ABNORMAL LOW (ref 6.5–8.1)

## 2022-04-18 LAB — GLUCOSE, CAPILLARY
Glucose-Capillary: 55 mg/dL — ABNORMAL LOW (ref 70–99)
Glucose-Capillary: 164 mg/dL — ABNORMAL HIGH (ref 70–99)
Glucose-Capillary: 61 mg/dL — ABNORMAL LOW (ref 70–99)
Glucose-Capillary: 60 mg/dL — ABNORMAL LOW (ref 70–99)
Glucose-Capillary: 60 mg/dL — ABNORMAL LOW (ref 70–99)
Glucose-Capillary: 136 mg/dL — ABNORMAL HIGH (ref 70–99)
Glucose-Capillary: 83 mg/dL (ref 70–99)
Glucose-Capillary: 77 mg/dL (ref 70–99)
Glucose-Capillary: 87 mg/dL (ref 70–99)

## 2022-04-18 LAB — CBC
HCT: 34.1 % — ABNORMAL LOW (ref 39.0–52.0)
Hemoglobin: 10.9 g/dL — ABNORMAL LOW (ref 13.0–17.0)
MCH: 27.9 pg (ref 26.0–34.0)
MCHC: 32 g/dL (ref 30.0–36.0)
MCV: 87.2 fL (ref 80.0–100.0)
Platelets: 316 10*3/uL (ref 150–400)
RBC: 3.91 MIL/uL — ABNORMAL LOW (ref 4.22–5.81)
RDW: 15.8 % — ABNORMAL HIGH (ref 11.5–15.5)
WBC: 9.1 10*3/uL (ref 4.0–10.5)
nRBC: 0 % (ref 0.0–0.2)

## 2022-04-18 LAB — BLOOD GAS, ARTERIAL
Acid-base deficit: 9.1 mmol/L — ABNORMAL HIGH (ref 0.0–2.0)
Bicarbonate: 17.5 mmol/L — ABNORMAL LOW (ref 20.0–28.0)
Drawn by: 31996
FIO2: 32 %
O2 Saturation: 99.8 %
Patient temperature: 37
pCO2 arterial: 40 mmHg (ref 32–48)
pH, Arterial: 7.25 — ABNORMAL LOW (ref 7.35–7.45)
pO2, Arterial: 117 mmHg — ABNORMAL HIGH (ref 83–108)

## 2022-04-18 LAB — MAGNESIUM: Magnesium: 3 mg/dL — ABNORMAL HIGH (ref 1.7–2.4)

## 2022-04-18 LAB — VANCOMYCIN, RANDOM: Vancomycin Rm: 31 ug/mL

## 2022-04-18 MED ORDER — ENOXAPARIN SODIUM 80 MG/0.8ML IJ SOSY
1.0000 mg/kg | PREFILLED_SYRINGE | INTRAMUSCULAR | Status: DC
Start: 2022-04-18 — End: 2022-04-20
  Administered 2022-04-18 – 2022-04-19 (×2): 72.5 mg via SUBCUTANEOUS
  Filled 2022-04-18 (×2): qty 0.8

## 2022-04-18 MED ORDER — INSULIN GLARGINE-YFGN 100 UNIT/ML ~~LOC~~ SOLN
13.0000 [IU] | Freq: Every day | SUBCUTANEOUS | Status: DC
  Filled 2022-04-18 (×2): qty 0.13

## 2022-04-18 MED ORDER — DEXTROSE 50 % IV SOLN
INTRAVENOUS | Status: AC
  Administered 2022-04-18: 50 mL
  Filled 2022-04-18: qty 50

## 2022-04-18 MED ORDER — HYDRALAZINE HCL 20 MG/ML IJ SOLN
10.0000 mg | Freq: Four times a day (QID) | INTRAMUSCULAR | Status: DC | PRN
  Administered 2022-04-18 – 2022-04-20 (×4): 10 mg via INTRAVENOUS
  Filled 2022-04-18 (×4): qty 1

## 2022-04-18 MED ORDER — DEXTROSE 50 % IV SOLN
12.5000 g | INTRAVENOUS | Status: AC
  Administered 2022-04-18: 12.5 g via INTRAVENOUS

## 2022-04-18 MED ORDER — CHLORHEXIDINE GLUCONATE CLOTH 2 % EX PADS
6.0000 | MEDICATED_PAD | Freq: Every day | CUTANEOUS | Status: DC
  Administered 2022-04-18 – 2022-04-24 (×7): 6 via TOPICAL

## 2022-04-18 MED ORDER — DEXTROSE 50 % IV SOLN
INTRAVENOUS | Status: AC
  Filled 2022-04-18: qty 50

## 2022-04-18 NOTE — Progress Notes (Signed)
SLP Cancellation Note  Patient Details Name: SLATER MCMANAMAN MRN: [id number] DOB: [birth year]   Cancelled treatment:       Reason Eval/Treat Not Completed: Patient not medically ready;Fatigue/lethargy limiting ability to participate. Further, Pt has been moved to step down therefore, ST will complete the order. Please place a new order when Pt is appropriate for BSE.  Thank you,  Mario Voong H. Roddie Mc, CCC-SLP Speech Language Pathologist    Wende Bushy 04/18/2022, 9:33 AM

## 2022-04-18 NOTE — Plan of Care (Signed)

## 2022-04-18 NOTE — Progress Notes (Signed)
Palliative:  HPI: 87 y.o. male  with past medical history of COPD on 3L chronically, pulmonary fibrosis, HFpEF, atrial fibrillation, HTN, HLD, iron deficiency anemia, CKD stage 3b, diabetes, recent admission for COPD exacerbation and pneumonia 6/9-6/13 admitted on 04/14/2022 with dyspnea. Mental status change and code stroke called 7/17. MRI head negative for stroke.    I discussed with Dr. Manuella [name] and RN Edward [name] Edward [name]'s worsening condition. He is much less responsive and poor airway protection. He is more restless today. I was able to reach son, Edward [name], after multiple calls. I expressed my concern for his father's worsening health status with concern for his lungs and kidney function. We discussed the severity of his condition and more to ICU level care. I discussed with Edward [name] expectations and decisions if his father continues to decline. We discussed resuscitation and ventilator support and after our discussion Edward [name] agrees that we should not put his father through resuscitative measures - "he has suffered enough."   Update: I was able to visit with son Edward [name] and wife Edward [name] at bedside. I reviewed with them his worsening status. Edward [name] shares how different his father is from Sunday. I discussed my concern for patients who worsen in the hospital despite receiving aggressive care. We reviewed the severity of his underlying lung disease. They both continue to agree with DNR. Wife agrees as well. They understand that there is a chance he may not survive the night. Edward [name] has called his brother and plans to reach out to other family to visit as desired. Wife shares that they plan to work with Edward [name] and Edward [name] funeral home if something were to happen. Edward [name] asks that we "do what we can" while also minimizing suffering if he worsening. I did explain that sometimes when patients continue to worsen towards end of life we may recommend shift of care to comfort.   All questions/concerns addressed. Emotional support  provided. Updated RN and Dr. Manuella [name].   Exam: Unresponsive. Breathing more labored with some apnea noted. Abd distended but soft. Restless.   Plan: - DNR decided - Continue conservative interventions for now; would not recommend escalation to vasopressor/aggressive measures - Family understanding of poor prognosis and prepared for the worse  68 min  [name], NP Palliative Medicine Team Pager [phone] (Please see [email or web address] for schedule) Team Phone [phone]    Greater than 50%  of this time was spent counseling and coordinating care related to the above assessment and plan

## 2022-04-18 NOTE — Progress Notes (Signed)
Hypoglycemic Event  CBG: 60   Treatment: D50 50 mL (25 gm)  Symptoms: None  Follow-up CBG: Time:1658 CBG Result:136  Possible Reasons for Event: Inadequate meal intake  Comments/MD notified:Shah, MD.     Virgina Evener Juna Caban

## 2022-04-18 NOTE — Inpatient Diabetes Management (Signed)
Inpatient Diabetes Program Recommendations  AACE/ADA: New Consensus Statement on Inpatient Glycemic Control   Target Ranges:  Prepandial:   less than 140 mg/dL      Peak postprandial:   less than 180 mg/dL (1-2 hours)      Critically ill patients:  140 - 180 mg/dL    Latest Reference Range & Units 04/17/22 07:23 04/17/22 11:17 04/17/22 16:08 04/17/22 21:31 04/18/22 07:15 04/18/22 07:43  Glucose-Capillary 70 - 99 mg/dL 133 (H) 124 (H) 107 (H) 81 55 (L) 164 (H)   Review of Glycemic Control  Diabetes history: DM2 Outpatient Diabetes medications: Actos 30 mg daily, Lantus 15-30 units daily Current orders for Inpatient glycemic control: Semglee 15 units daily, Novolog 0-15 units TID with meals, Novolog 0-5 units QHS, Actos 30 mg daily; Solumedrol 40 mg Q12H  Inpatient Diabetes Program Recommendations:    Insulin: Fasting glucose 55 mg/dl today. Please consider discontinue Actos and decreasing Semglee to 13 units daily.  Thanks, Barnie Alderman, RN, MSN, [place] Diabetes Coordinator Inpatient Diabetes Program [phone] (Team Pager from 8am to [place])

## 2022-04-18 NOTE — Progress Notes (Signed)
PROGRESS NOTE    Edward [name]  PYK:[id number] DOB: [birth year] DOA: 04/14/2022 PCP: Susy Frizzle, MD   Brief Narrative:    Edward [name] is a 87 y.o. male with medical history significant for COPD and chronic hypoxemic respiratory failure on 3 L nasal cannula who was recently discharged on 6/13 after treatment for acute COPD exacerbation and metabolic encephalopathy in the setting of pneumonia.  He was discharged with a regimen of oral antibiotics and steroid taper, but it does not appear that he actually picked up these medications.  He states that he has had some persistent dyspnea that has been worsening along with a nonproductive cough for the last 2-3 weeks.  He was readmitted with acute COPD exacerbation in the setting of persistent pneumonia.  He is also now noted to have worsening AKI while being treated with vancomycin.  He was noted to have 1 blood culture positive with MRSA.  On 7/17 he was noted to be more confused and lethargic and have a code stroke called, but there was no acute CVA noted.  He was noted to have some worsening encephalopathy.  He has continued to have worsening of his overall condition which necessitated transfer to stepdown unit for closer monitoring.  Palliative care has had further discussion with family who are now in agreement for DNR.  He appears to have very poor prognosis.  Assessment & Plan:   Principal Problem:   COPD with acute exacerbation ([place]) Active Problems:   Hypertension   DM type 2 causing vascular disease ([name])   Chronic diastolic CHF (congestive heart failure) ([name])   HCAP (healthcare-associated pneumonia)   AF (paroxysmal atrial fibrillation) ([name])   Hyponatremia  Assessment and Plan:   Acute COPD exacerbation with concern for persistent pneumonia -Continue IV Solu-Medrol twice daily as ordered, decrease to 40 mg twice daily -DuoNebs as needed for shortness of breath or wheezing -Antitussives -Continue vancomycin and cefepime  empirically for HCAP and check MRSA screen -Follow lab work -Currently on baseline 3 L nasal cannula oxygen -Consult to palliative care per PCCM recommendations; patient overall with very poor prognosis and likely may have in-hospital death.   Acute metabolic encephalopathy -PCO2 and ammonia within normal limits -Code stroke called 7/17 with head CT and brain MRI with no acute findings -Likely due to Lyrica use in the setting of AKI and this has not been discontinued -Continue to monitor closely for improvement   MRSA bacteremia-unlikely -Repeat blood cultures negative -Vancomycin held for now given AKI   History of paroxysmal atrial fibrillation -Continue Eliquis at home and metoprolol for heart rate control   Chronic diastolic CHF -2D echocardiogram 1 month ago with EF 60-65% and grade 2 diastolic dysfunction -Hold Lasix for now and give gentle IV fluid given AKI   AKI on CKD stage IIIb-improving -Likely ATN due to vancomycin toxicity -Renal ultrasound with no acute findings -Hold further IV fluid for now -Repeat a.m. labs   Normocytic anemia -Continue to monitor   Hypertension -Continue home medications and monitor   Type 2 diabetes with hypoglycemia -A1c 13.6% on 6/9 -Discontinue Actos -Decreased Semglee to 13 units daily for now   Deconditioning/debility -Home health PT     DVT prophylaxis: Eliquis Code Status: DNR Family Communication: Discussed with son on phone 7/17 Disposition Plan:  Status is: Inpatient Remains inpatient appropriate because: Continues to require IV medications/antibiotics     Consultants:  PCCM Palliative TeleNeurology   Procedures:  None   Antimicrobials:  Anti-infectives (From  admission, onward)    Start     Dose/Rate Route Frequency Ordered Stop   04/17/22 1030  Ampicillin-Sulbactam (UNASYN) 3 g in sodium chloride 0.9 % 100 mL IVPB        3 g 200 mL/hr over 30 Minutes Intravenous Every 12 hours 04/17/22 0957     04/17/22  1000  ceFEPIme (MAXIPIME) 2 g in sodium chloride 0.9 % 100 mL IVPB  Status:  Discontinued        2 g 200 mL/hr over 30 Minutes Intravenous Every 24 hours 04/16/22 1229 04/17/22 0947   04/14/22 2200  vancomycin (VANCOREADY) IVPB 750 mg/150 mL  Status:  Discontinued        750 mg 150 mL/hr over 60 Minutes Intravenous Every 12 hours 04/14/22 1151 04/16/22 1144   04/14/22 1015  ceFEPIme (MAXIPIME) 2 g in sodium chloride 0.9 % 100 mL IVPB  Status:  Discontinued        2 g 200 mL/hr over 30 Minutes Intravenous Every 12 hours 04/14/22 1009 04/16/22 1229   04/14/22 1015  vancomycin (VANCOREADY) IVPB 1500 mg/300 mL        1,500 mg 150 mL/hr over 120 Minutes Intravenous  Once 04/14/22 1013 04/14/22 1410      Subjective: Patient seen and evaluated today with worsening confusion and respiratory distress noted.  No acute overnight events noted.  Objective: Vitals:   04/18/22 1100 04/18/22 1300 04/18/22 1400 04/18/22 1500  BP: (!) 164/59 (!) 162/67 (!) 162/58 (!) 152/52  Pulse: (!) 56 61 62 60  Resp: '12 13 12 13  '$ Temp:      TempSrc:      SpO2: 99% 98% 97% 98%  Weight:      Height:        Intake/Output Summary (Last 24 hours) at 04/18/2022 1603 Last data filed at 04/18/2022 1453 Gross per 24 hour  Intake 60 ml  Output 1150 ml  Net -1090 ml   Filed Weights   04/14/22 0727 04/14/22 1440  Weight: 72.6 kg 72 kg    Examination:  General exam: Appears to be in moderate distress Respiratory system: Congested with increased work of breathing.  Currently on nasal cannula Cardiovascular system: S1 & S2 heard, RRR.  Gastrointestinal system: Abdomen is soft Central nervous system: Alert and awake Extremities: No edema Skin: No significant lesions noted Psychiatry: Flat affect.    Data Reviewed: I have personally reviewed following labs and imaging studies  CBC: Recent Labs  Lab 04/14/22 0743 04/15/22 0653 04/16/22 0518 04/17/22 0435 04/18/22 0511  WBC 7.0 9.4 13.1* 11.8* 9.1   NEUTROABS 4.3  --   --   --   --   HGB 11.9* 10.3* 9.4* 10.0* 10.9*  HCT 37.1* 32.4* 30.0* 31.7* 34.1*  MCV 87.3 87.8 88.5 88.8 87.2  PLT 272 274 254 267 301   Basic Metabolic Panel: Recent Labs  Lab 04/14/22 0743 04/15/22 0653 04/16/22 0518 04/17/22 0435 04/18/22 0511  NA 133* 135 132* 134* 138  K 3.9 4.6 4.6 4.8 4.5  CL 100 103 103 106 109  CO2 26 25 20* 18* 19*  GLUCOSE 231* 187* 192* 147* 49*  BUN 40* 49* 72* 89* 98*  CREATININE 1.13 1.46* 2.16* 2.65* 2.49*  CALCIUM 8.8* 8.3* 8.0* 7.8* 8.0*  MG  --  2.7* 2.7* 2.8* 3.0*   GFR: Estimated Creatinine Clearance: 22.1 mL/min (A) (by C-G formula based on SCr of 2.49 mg/dL (H)). Liver Function Tests: Recent Labs  Lab 04/14/22 0743 04/17/22 0920  04/18/22 0511  AST 15 11* 12*  ALT '12 11 11  '$ ALKPHOS 74 55 52  BILITOT 0.3 0.4 0.5  PROT 7.4 6.4* 6.4*  ALBUMIN 3.3* 2.8* 2.7*   No results for input(s): "LIPASE", "AMYLASE" in the last 168 hours. Recent Labs  Lab 04/17/22 0920  AMMONIA 22   Coagulation Profile: No results for input(s): "INR", "PROTIME" in the last 168 hours. Cardiac Enzymes: No results for input(s): "CKTOTAL", "CKMB", "CKMBINDEX", "TROPONINI" in the last 168 hours. BNP (last 3 results) No results for input(s): "PROBNP" in the last 8760 hours. HbA1C: No results for input(s): "HGBA1C" in the last 72 hours. CBG: Recent Labs  Lab 04/18/22 0715 04/18/22 0743 04/18/22 1147 04/18/22 1412 04/18/22 1600  GLUCAP 55* 164* 61* 60* 60*   Lipid Profile: No results for input(s): "CHOL", "HDL", "LDLCALC", "TRIG", "CHOLHDL", "LDLDIRECT" in the last 72 hours. Thyroid Function Tests: No results for input(s): "TSH", "T4TOTAL", "FREET4", "T3FREE", "THYROIDAB" in the last 72 hours. Anemia Panel: No results for input(s): "VITAMINB12", "FOLATE", "FERRITIN", "TIBC", "IRON", "RETICCTPCT" in the last 72 hours. Sepsis Labs: Recent Labs  Lab 04/14/22 1010  PROCALCITON 0.17    Recent Results (from the past 240  hour(s))  Culture, blood (Routine X 2) w Reflex to ID Panel     Status: None (Preliminary result)   Collection Time: 04/16/22  7:17 AM   Specimen: BLOOD LEFT HAND  Result Value Ref Range Status   Specimen Description   Final    BLOOD LEFT HAND BOTTLES DRAWN AEROBIC AND ANAEROBIC   Special Requests Blood Culture adequate volume  Final   Culture   Final    NO GROWTH 2 DAYS Performed at [facility], [address], [place], [place] [address]    Report Status PENDING  Incomplete  Culture, blood (Routine X 2) w Reflex to ID Panel     Status: None (Preliminary result)   Collection Time: 04/16/22  7:17 AM   Specimen: BLOOD RIGHT HAND  Result Value Ref Range Status   Specimen Description BLOOD RIGHT HAND BOTTLES DRAWN AEROBIC ONLY  Final   Special Requests Blood Culture adequate volume  Final   Culture   Final    NO GROWTH 2 DAYS Performed at [facility], [address], [place], [place] [address]    Report Status PENDING  Incomplete         Radiology Studies: DG CHEST PORT 1 VIEW  Result Date: 04/17/2022 CLINICAL DATA:  Increasing shortness of breath and productive cough in a patient with history of diabetes a pulmonary fibrosis EXAM: PORTABLE CHEST 1 VIEW COMPARISON:  April 14, 2022. FINDINGS: EKG leads project over the chest. Cardiomediastinal contours and hilar structures are stable with persistent cardiac enlargement accentuated by AP projection and portable technique. Increasing density within LEFT upper lobe airspace disease. No new area of consolidation with scattered linear areas of atelectasis in the chest. No pneumothorax. On limited assessment no acute skeletal findings. IMPRESSION: Increasing density within the LEFT upper lobe airspace disease. Findings remain compatible with pneumonia at this time but would suggest follow-up to ensure resolution. Increasing lead defined or dense appearance could reflect evolution of the infectious process, not currently as diffuse as on the  prior study. Electronically Signed   By: Zetta Bills M.D.   On: 04/17/2022 14:56   MR ANGIO HEAD WO CONTRAST  Result Date: 04/17/2022 CLINICAL DATA:  Neuro deficit, acute, stroke suspected. Encephalopathy and generalized weakness. EXAM: MRI HEAD WITHOUT CONTRAST MRA HEAD WITHOUT CONTRAST MRA NECK  WITHOUT CONTRAST TECHNIQUE: Multiplanar, multiecho pulse sequences of the brain and surrounding structures were obtained without intravenous contrast. Angiographic images of the Circle of Willis were obtained using MRA technique without intravenous contrast. Angiographic images of the neck were obtained using MRA technique without intravenous contrast. Carotid stenosis measurements (when applicable) are obtained utilizing NASCET criteria, using the distal internal carotid diameter as the denominator. COMPARISON:  Head CT 04/17/2022.  Head MRI and MRA 09/26/2017. FINDINGS: MRI HEAD FINDINGS Multiple sequences are mildly to moderately motion degraded. Brain: There is no evidence of an acute infarct, intracranial hemorrhage, mass, midline shift, or extra-axial fluid collection. T2 hyperintensities in the cerebral white matter bilaterally are similar to the prior MRI and are nonspecific but compatible with mild chronic small vessel ischemic disease. There is moderate cerebral atrophy. Vascular: Major intracranial vascular flow voids are preserved. Skull and upper cervical spine: Unremarkable bone marrow signal. Sinuses/Orbits: Bilateral cataract extraction. Clear paranasal sinuses. Small left mastoid effusion. Other: None. MRA HEAD FINDINGS The included portions of the intracranial vertebral arteries are widely patent to the basilar. Patent PICA and SCA origins are visualized bilaterally. The basilar artery is widely patent. Posterior communicating arteries are diminutive or absent. Both PCAs are patent with multifocal severe right P2 stenoses which are new from the 2018 MRA. There is up to mild left P2 stenosis. The  internal carotid arteries are patent from skull base to carotid termini without definite evidence of a high-grade stenosis, although motion artifact limits assessment. ACAs and MCAs are patent without evidence of a proximal branch occlusion or high-grade proximal stenosis. No aneurysm is identified. MRA NECK FINDINGS Evaluation is limited by extensive motion artifact and noncontrast technique. There is nondiagnostic assessment of the aortic arch, brachiocephalic and subclavian arteries, and proximal common carotid and vertebral arteries. Flow is present in the mid and distal common carotid arteries, cervical internal carotid arteries, and distal V1 through V4 segments of the vertebral arteries, however motion artifact precludes assessment for stenosis. IMPRESSION: 1. No acute intracranial abnormality. 2. Mild chronic small vessel ischemic disease and moderate cerebral atrophy. 3. Severely limited neck MRA due to motion artifact. Grossly patent cervical carotid and vertebral arteries. 4. No major intracranial arterial occlusion. Severe right P2 stenoses. Electronically Signed   By: Logan Bores M.D.   On: 04/17/2022 13:13   MR ANGIO NECK WO CONTRAST  Result Date: 04/17/2022 CLINICAL DATA:  Neuro deficit, acute, stroke suspected. Encephalopathy and generalized weakness. EXAM: MRI HEAD WITHOUT CONTRAST MRA HEAD WITHOUT CONTRAST MRA NECK WITHOUT CONTRAST TECHNIQUE: Multiplanar, multiecho pulse sequences of the brain and surrounding structures were obtained without intravenous contrast. Angiographic images of the Circle of Willis were obtained using MRA technique without intravenous contrast. Angiographic images of the neck were obtained using MRA technique without intravenous contrast. Carotid stenosis measurements (when applicable) are obtained utilizing NASCET criteria, using the distal internal carotid diameter as the denominator. COMPARISON:  Head CT 04/17/2022.  Head MRI and MRA 09/26/2017. FINDINGS: MRI HEAD  FINDINGS Multiple sequences are mildly to moderately motion degraded. Brain: There is no evidence of an acute infarct, intracranial hemorrhage, mass, midline shift, or extra-axial fluid collection. T2 hyperintensities in the cerebral white matter bilaterally are similar to the prior MRI and are nonspecific but compatible with mild chronic small vessel ischemic disease. There is moderate cerebral atrophy. Vascular: Major intracranial vascular flow voids are preserved. Skull and upper cervical spine: Unremarkable bone marrow signal. Sinuses/Orbits: Bilateral cataract extraction. Clear paranasal sinuses. Small left mastoid effusion. Other: None. MRA HEAD  FINDINGS The included portions of the intracranial vertebral arteries are widely patent to the basilar. Patent PICA and SCA origins are visualized bilaterally. The basilar artery is widely patent. Posterior communicating arteries are diminutive or absent. Both PCAs are patent with multifocal severe right P2 stenoses which are new from the 2018 MRA. There is up to mild left P2 stenosis. The internal carotid arteries are patent from skull base to carotid termini without definite evidence of a high-grade stenosis, although motion artifact limits assessment. ACAs and MCAs are patent without evidence of a proximal branch occlusion or high-grade proximal stenosis. No aneurysm is identified. MRA NECK FINDINGS Evaluation is limited by extensive motion artifact and noncontrast technique. There is nondiagnostic assessment of the aortic arch, brachiocephalic and subclavian arteries, and proximal common carotid and vertebral arteries. Flow is present in the mid and distal common carotid arteries, cervical internal carotid arteries, and distal V1 through V4 segments of the vertebral arteries, however motion artifact precludes assessment for stenosis. IMPRESSION: 1. No acute intracranial abnormality. 2. Mild chronic small vessel ischemic disease and moderate cerebral atrophy. 3.  Severely limited neck MRA due to motion artifact. Grossly patent cervical carotid and vertebral arteries. 4. No major intracranial arterial occlusion. Severe right P2 stenoses. Electronically Signed   By: Logan Bores M.D.   On: 04/17/2022 13:13   MR BRAIN WO CONTRAST  Result Date: 04/17/2022 CLINICAL DATA:  Neuro deficit, acute, stroke suspected. Encephalopathy and generalized weakness. EXAM: MRI HEAD WITHOUT CONTRAST MRA HEAD WITHOUT CONTRAST MRA NECK WITHOUT CONTRAST TECHNIQUE: Multiplanar, multiecho pulse sequences of the brain and surrounding structures were obtained without intravenous contrast. Angiographic images of the Circle of Willis were obtained using MRA technique without intravenous contrast. Angiographic images of the neck were obtained using MRA technique without intravenous contrast. Carotid stenosis measurements (when applicable) are obtained utilizing NASCET criteria, using the distal internal carotid diameter as the denominator. COMPARISON:  Head CT 04/17/2022.  Head MRI and MRA 09/26/2017. FINDINGS: MRI HEAD FINDINGS Multiple sequences are mildly to moderately motion degraded. Brain: There is no evidence of an acute infarct, intracranial hemorrhage, mass, midline shift, or extra-axial fluid collection. T2 hyperintensities in the cerebral white matter bilaterally are similar to the prior MRI and are nonspecific but compatible with mild chronic small vessel ischemic disease. There is moderate cerebral atrophy. Vascular: Major intracranial vascular flow voids are preserved. Skull and upper cervical spine: Unremarkable bone marrow signal. Sinuses/Orbits: Bilateral cataract extraction. Clear paranasal sinuses. Small left mastoid effusion. Other: None. MRA HEAD FINDINGS The included portions of the intracranial vertebral arteries are widely patent to the basilar. Patent PICA and SCA origins are visualized bilaterally. The basilar artery is widely patent. Posterior communicating arteries are  diminutive or absent. Both PCAs are patent with multifocal severe right P2 stenoses which are new from the 2018 MRA. There is up to mild left P2 stenosis. The internal carotid arteries are patent from skull base to carotid termini without definite evidence of a high-grade stenosis, although motion artifact limits assessment. ACAs and MCAs are patent without evidence of a proximal branch occlusion or high-grade proximal stenosis. No aneurysm is identified. MRA NECK FINDINGS Evaluation is limited by extensive motion artifact and noncontrast technique. There is nondiagnostic assessment of the aortic arch, brachiocephalic and subclavian arteries, and proximal common carotid and vertebral arteries. Flow is present in the mid and distal common carotid arteries, cervical internal carotid arteries, and distal V1 through V4 segments of the vertebral arteries, however motion artifact precludes assessment for stenosis. IMPRESSION:  1. No acute intracranial abnormality. 2. Mild chronic small vessel ischemic disease and moderate cerebral atrophy. 3. Severely limited neck MRA due to motion artifact. Grossly patent cervical carotid and vertebral arteries. 4. No major intracranial arterial occlusion. Severe right P2 stenoses. Electronically Signed   By: Logan Bores M.D.   On: 04/17/2022 13:13   CT HEAD CODE STROKE WO CONTRAST  Result Date: 04/17/2022 CLINICAL DATA:  Code stroke.  Neuro deficit, acute, stroke suspected EXAM: CT HEAD WITHOUT CONTRAST TECHNIQUE: Contiguous axial images were obtained from the base of the skull through the vertex without intravenous contrast. RADIATION DOSE REDUCTION: This exam was performed according to the departmental dose-optimization program which includes automated exposure control, adjustment of the mA and/or kV according to patient size and/or use of iterative reconstruction technique. COMPARISON:  CT head July 07, 2021. FINDINGS: Brain: No evidence of acute large vascular territory  infarction, hemorrhage, hydrocephalus, extra-axial collection or mass lesion/mass effect. Similar chronic microvascular disease and cerebral atrophy. Vascular: No hyperdense vessel identified. Skull: No acute fracture. Sinuses/Orbits: Left maxillary sinus mucosal thinking. No acute orbital findings Other: None. ASPECTS [facility] Stroke Program Early CT Score) total score (0-10 with 10 being normal): 10. IMPRESSION: 1. No evidence of acute intracranial abnormality. 2. ASPECTS is 10. Code stroke imaging results were communicated on 04/17/2022 at 9:35 am to provider Apolonio Schneiders via telephone, who verbally acknowledged these results. Electronically Signed   By: Margaretha Sheffield M.D.   On: 04/17/2022 09:37        Scheduled Meds:  apixaban  2.5 mg Oral BID   arformoterol  15 mcg Nebulization BID   budesonide (PULMICORT) nebulizer solution  0.25 mg Nebulization BID   Chlorhexidine Gluconate Cloth  6 each Topical Daily   cloNIDine  0.1 mg Oral BID   insulin aspart  0-15 Units Subcutaneous TID WC   insulin aspart  0-5 Units Subcutaneous QHS   [START ON 04/19/2022] insulin glargine-yfgn  13 Units Subcutaneous Daily   methylPREDNISolone (SOLU-MEDROL) injection  40 mg Intravenous Q12H   metoprolol tartrate  25 mg Oral BID   pravastatin  80 mg Oral QHS   revefenacin  175 mcg Nebulization Daily   Continuous Infusions:  ampicillin-sulbactam (UNASYN) IV 3 g (04/18/22 0934)     LOS: 4 days    Time spent: 35 minutes    Curlie Sittner Darleen Crocker, DO [facility]  If 7PM-7AM, please contact night-coverage [email or web address] 04/18/2022, 4:03 PM

## 2022-04-18 NOTE — Progress Notes (Signed)
Report given to stephanie RN in ICU, no further questions at this time. Charge RN and NT transferred pt down to unit.

## 2022-04-18 NOTE — Progress Notes (Signed)
MD made aware of pt being lethargic upon assessment post shift change, pt responsive to voice but will not communicate back, will only open his eyes for a brief moment, look in the talkers direction and close eyes again. V/S stable, continues on 3L of oxygen chronically, data shared with MD. Charge nurse made aware. MD bedside shortly after findings.

## 2022-04-18 NOTE — Progress Notes (Signed)
ANTICOAGULATION CONSULT NOTE - Initial Consult  Pharmacy Consult for Enoxaparin Indication: atrial fibrillation  Allergies  Allergen Reactions   Ace Inhibitors Other (See Comments)    Hyperkalemia--07/23/2013:patient states not familiar with the following allergy    Patient Measurements: Height: '5\' 9"'$  (175.3 cm) Weight: 72 kg (158 lb 11.7 oz) IBW/kg (Calculated) : 70.7  Vital Signs: Temp: 98.2 F (36.8 C) (07/18 1934) Temp Source: Axillary (07/18 1622) BP: 173/64 (07/18 2000) Pulse Rate: 68 (07/18 2000)  Labs: Recent Labs    04/16/22 0518 04/17/22 0435 04/18/22 0511  HGB 9.4* 10.0* 10.9*  HCT 30.0* 31.7* 34.1*  PLT 254 267 316  CREATININE 2.16* 2.65* 2.49*    Estimated Creatinine Clearance: 22.1 mL/min (A) (by C-G formula based on SCr of 2.49 mg/dL (H)).   Medical History: Past Medical History:  Diagnosis Date   Allergy    Rhinitis   Atrial fibrillation ([name])    Bronchitis    Chronic respiratory failure ([name])    Colon polyps    COPD (chronic obstructive pulmonary disease) ([employer])    Diabetes mellitus    Hypercholesterolemia    Hypertension    Iron deficiency anemia due to chronic blood loss 04/30/2018   Noncompliance    On home O2    2L N/C    PSA elevation    Pulmonary fibrosis ([name])    Vitamin D deficiency     Medications:  Medications Prior to Admission  Medication Sig Dispense Refill Last Dose   albuterol (PROVENTIL) (2.5 MG/3ML) 0.083% nebulizer solution INHALE 3ML BY NEBULIZATION ROUTE EVERY 6 HOURS AS NEEDED FOR WHEEZING OR SHORTNESS OF BREATH (Patient taking differently: Take 2.5 mg by nebulization every 6 (six) hours as needed for shortness of breath.) 3 mL 3 04/14/2022   apixaban (ELIQUIS) 2.5 MG TABS tablet Take 1 tablet (2.5 mg total) by mouth 2 (two) times daily. (Patient taking differently: Take 5 mg by mouth 2 (two) times daily.) 60 tablet 2 04/14/2022 at 0600   cloNIDine (CATAPRES) 0.1 MG tablet Take 0.1 mg by mouth 2 (two) times daily.    04/14/2022   fluticasone-salmeterol (WIXELA INHUB) 500-50 MCG/ACT AEPB Inhale 1 puff into the lungs in the morning and at bedtime. This replaces symbicort, 3 each 3 04/14/2022   furosemide (LASIX) 40 MG tablet TAKE 1 TABLET BY MOUTH DAILY 30 tablet 10 04/14/2022   insulin glargine (LANTUS SOLOSTAR) 100 UNIT/ML Solostar Pen Inject 15 Units into the skin at bedtime. (Patient taking differently: Inject 15 Units into the skin See admin instructions. If sugar is elevated can go as high as 30 units at bedtime) 15 mL 11 04/13/2022   metoprolol tartrate (LOPRESSOR) 25 MG tablet TAKE 1 TABLET BY MOUTH TWICE DAILY (Patient taking differently: Take 25 mg by mouth 2 (two) times daily.) 60 tablet 3 04/14/2022 at 0600   OXYGEN Inhale 3 L into the lungs continuous.       pioglitazone (ACTOS) 30 MG tablet Take 1 tablet (30 mg total) by mouth daily. 30 tablet 3 04/14/2022   potassium chloride SA (KLOR-CON) 20 MEQ tablet Take 1 tablet by mouth once daily 90 tablet 0 04/14/2022   pravastatin (PRAVACHOL) 80 MG tablet TAKE 1 TABLET BY MOUTH AT BEDTIME (Patient taking differently: Take 80 mg by mouth at bedtime.) 30 tablet 3 04/13/2022   pregabalin (LYRICA) 100 MG capsule Take 1 capsule (100 mg total) by mouth 2 (two) times daily. 60 capsule 3 04/14/2022   tiotropium (SPIRIVA HANDIHALER) 18 MCG inhalation capsule Place 1 capsule (  18 mcg total) into inhaler and inhale daily. 90 capsule 3 04/14/2022   glucose blood (ONETOUCH ULTRA) test strip CHECK FASTING BLOOD SUGAR TWICE DAILY 50 strip 3    Lancets (ONETOUCH DELICA PLUS [id number]) MISC USE TO CHECK BLOOD SUGAR TWICE DAILY AS DIRECTED 100 each 0    Scheduled:   arformoterol  15 mcg Nebulization BID   budesonide (PULMICORT) nebulizer solution  0.25 mg Nebulization BID   Chlorhexidine Gluconate Cloth  6 each Topical Daily   cloNIDine  0.1 mg Oral BID   insulin aspart  0-15 Units Subcutaneous TID WC   insulin aspart  0-5 Units Subcutaneous QHS   [START ON 04/19/2022] insulin  glargine-yfgn  13 Units Subcutaneous Daily   methylPREDNISolone (SOLU-MEDROL) injection  40 mg Intravenous Q12H   metoprolol tartrate  25 mg Oral BID   pravastatin  80 mg Oral QHS   revefenacin  175 mcg Nebulization Daily   Infusions:   ampicillin-sulbactam (UNASYN) IV 3 g (04/18/22 0934)    Assessment: 62 yom with a history of COPD and chronic resp failure. Patient presenting with acute COPD exacerbation, AKI, pna. Enoxaparin per pharmacy consult placed for AF.  Patient previously on eliquis. Last dose 7/17 2206.  Hgb 10.9; plt 316  Goal of Therapy:  Monitor platelets by anticoagulation protocol: Yes   Plan:  START enoxaparin 72.5 mg (1 mg/kg) q24hr F/u plan to resume eliquis post-heparin Monitor for s/s of hemorrhage, hgb, plt  Lorelei Pont, PharmD, BCPS 04/18/2022 8:51 PM ED Clinical Pharmacist -  [phone]

## 2022-04-18 NOTE — Progress Notes (Signed)
Attempted to call Mendell Bontempo, pts son which is his point of contact, no answer and no voicemail set up at this time.

## 2022-04-19 DIAGNOSIS — J441 Chronic obstructive pulmonary disease with (acute) exacerbation: Secondary | ICD-10-CM | POA: Diagnosis not present

## 2022-04-19 DIAGNOSIS — Z7189 Other specified counseling: Secondary | ICD-10-CM | POA: Diagnosis not present

## 2022-04-19 DIAGNOSIS — Z515 Encounter for palliative care: Secondary | ICD-10-CM | POA: Diagnosis not present

## 2022-04-19 LAB — VANCOMYCIN, RANDOM: Vancomycin Rm: 24 ug/mL

## 2022-04-19 LAB — GLUCOSE, CAPILLARY
Glucose-Capillary: 64 mg/dL — ABNORMAL LOW (ref 70–99)
Glucose-Capillary: 77 mg/dL (ref 70–99)
Glucose-Capillary: 72 mg/dL (ref 70–99)
Glucose-Capillary: 70 mg/dL (ref 70–99)
Glucose-Capillary: 72 mg/dL (ref 70–99)
Glucose-Capillary: 83 mg/dL (ref 70–99)

## 2022-04-19 LAB — MRSA NEXT GEN BY PCR, NASAL: MRSA by PCR Next Gen: DETECTED — AB

## 2022-04-19 MED ORDER — MUPIROCIN 2 % EX OINT
TOPICAL_OINTMENT | Freq: Two times a day (BID) | CUTANEOUS | Status: DC
  Administered 2022-04-21 (×2): 1 via NASAL
  Filled 2022-04-19 (×2): qty 22

## 2022-04-19 MED ORDER — LACTATED RINGERS IV SOLN
INTRAVENOUS | Status: DC
Start: 2022-04-19 — End: 2022-04-21

## 2022-04-19 MED ORDER — SCOPOLAMINE 1 MG/3DAYS TD PT72
1.0000 | MEDICATED_PATCH | TRANSDERMAL | Status: DC
  Administered 2022-04-19 – 2022-04-22 (×2): 1.5 mg via TRANSDERMAL
  Filled 2022-04-19 (×3): qty 1

## 2022-04-19 MED ORDER — HALOPERIDOL LACTATE 5 MG/ML IJ SOLN
2.0000 mg | Freq: Once | INTRAMUSCULAR | Status: AC
  Administered 2022-04-19: 2 mg via INTRAVENOUS
  Filled 2022-04-19: qty 1

## 2022-04-19 NOTE — Progress Notes (Signed)
PROGRESS NOTE    Patient: Edward [name]                            PCP: Susy Frizzle, MD                    DOB: August 25, 1935            DOA: 04/14/2022 DGU:[id number]             DOS: 04/19/2022, 9:14 AM   LOS: 5 days   Date of Service: The patient was seen and examined on 04/19/2022  Subjective:   The patient was seen and examined this morning. Confused somnolent agitated, cannot maintain verbal communication. Hemodynamically stable, mildly hypertensive On 3 L of oxygen, satting 100%  Hyperglycemic overnight CBG 64, 77, 72  Brief Narrative:   Rhylee LEXX MONTE is a 87 y.o. male with medical history significant for COPD and chronic hypoxemic respiratory failure on 3 L nasal cannula who was recently discharged on 6/13 after treatment for acute COPD exacerbation and metabolic encephalopathy in the setting of pneumonia.  He was discharged with a regimen of oral antibiotics and steroid taper, but it does not appear that he actually picked up these medications.  He states that he has had some persistent dyspnea that has been worsening along with a nonproductive cough for the last 2-3 weeks.  He was readmitted with acute COPD exacerbation in the setting of persistent pneumonia.  He is also now noted to have worsening AKI while being treated with vancomycin.  He was noted to have 1 blood culture positive with MRSA.  On 7/17 he was noted to be more confused and lethargic and have a code stroke called, but there was no acute CVA noted.  He was noted to have some worsening encephalopathy.  He has continued to have worsening of his overall condition which necessitated transfer to stepdown unit for closer monitoring.  Palliative care has had further discussion with family who are now in agreement for DNR.  He appears to have very poor prognosis.      Assessment & Plan:   Principal Problem:   COPD with acute exacerbation ([place]) Active Problems:   Hypertension   DM type 2 causing vascular disease  ([name])   Chronic diastolic CHF (congestive heart failure) ([name])   HCAP (healthcare-associated pneumonia)   AF (paroxysmal atrial fibrillation) ([name])   Hyponatremia    Acute COPD exacerbation with concern for persistent pneumonia -Continue IV Solu-Medrol twice daily as ordered, decrease to 40 mg twice daily -DuoNebs as needed for shortness of breath or wheezing -Antitussives -Continue vancomycin and cefepime empirically for HCAP and check MRSA screen -Follow lab work -Currently on baseline 3 L nasal cannula oxygen satting 100% -Consult to palliative care per PCCM recommendations; patient overall with very poor prognosis and likely may have in-hospital death.   Acute metabolic encephalopathy -Worsening encephalopathy, currently somnolent, no verbal communication --PCO2 and ammonia within normal limits -Code stroke called 7/17 with head CT and brain MRI with no acute findings -Likely due to Lyrica use in the setting of AKI and this has not been discontinued -Continue to monitor closely for improvement   MRSA bacteremia-unlikely -Repeat blood cultures negative -Vancomycin held for now given AKI   History of paroxysmal atrial fibrillation -Continue Eliquis at home and metoprolol for heart rate control   Chronic diastolic CHF -2D echocardiogram 1 month ago with EF 60-65% and grade 2  diastolic dysfunction -Hold Lasix for now and give gentle IV fluid given AKI   AKI on CKD stage IIIb-improving -Likely ATN due to vancomycin toxicity -Renal ultrasound with no acute findings -Hold further IV fluid for now -Repeat a.m. labs   Normocytic anemia -Continue to monitor   Hypertension -Continue home medications and monitor   Type 2 diabetes with hypoglycemia -A1c 13.6% on 6/9 -Discontinue Actos -Decreased Semglee--- now discontinued   Deconditioning/debility -Home health PT     DVT prophylaxis: Eliquis Code Status: DNR Family Communication: Discussed with son on phone  7/17 Disposition Plan:  Status is: Inpatient Final disposition likely SNF with palliative care or hospice home   Consultants:  PCCM Palliative TeleNeurology   Procedures:  None        Admission status:   Status is: Inpatient Remains inpatient appropriate because: Medically unstable, progressing to DNR, seeking palliative care assistant in moving t forward o comfort care... Needing IV medications/antibiotics     Procedures:   No admission procedures for hospital encounter.   Antimicrobials:  Anti-infectives (From admission, onward)    Start     Dose/Rate Route Frequency Ordered Stop   04/17/22 1030  Ampicillin-Sulbactam (UNASYN) 3 g in sodium chloride 0.9 % 100 mL IVPB        3 g 200 mL/hr over 30 Minutes Intravenous Every 12 hours 04/17/22 0957     04/17/22 1000  ceFEPIme (MAXIPIME) 2 g in sodium chloride 0.9 % 100 mL IVPB  Status:  Discontinued        2 g 200 mL/hr over 30 Minutes Intravenous Every 24 hours 04/16/22 1229 04/17/22 0947   04/14/22 2200  vancomycin (VANCOREADY) IVPB 750 mg/150 mL  Status:  Discontinued        750 mg 150 mL/hr over 60 Minutes Intravenous Every 12 hours 04/14/22 1151 04/16/22 1144   04/14/22 1015  ceFEPIme (MAXIPIME) 2 g in sodium chloride 0.9 % 100 mL IVPB  Status:  Discontinued        2 g 200 mL/hr over 30 Minutes Intravenous Every 12 hours 04/14/22 1009 04/16/22 1229   04/14/22 1015  vancomycin (VANCOREADY) IVPB 1500 mg/300 mL        1,500 mg 150 mL/hr over 120 Minutes Intravenous  Once 04/14/22 1013 04/14/22 1410        Medication:   arformoterol  15 mcg Nebulization BID   budesonide (PULMICORT) nebulizer solution  0.25 mg Nebulization BID   Chlorhexidine Gluconate Cloth  6 each Topical Daily   cloNIDine  0.1 mg Oral BID   enoxaparin (LOVENOX) injection  1 mg/kg Subcutaneous Q24H   insulin aspart  0-15 Units Subcutaneous TID WC   insulin aspart  0-5 Units Subcutaneous QHS   insulin glargine-yfgn  13 Units Subcutaneous  Daily   methylPREDNISolone (SOLU-MEDROL) injection  40 mg Intravenous Q12H   metoprolol tartrate  25 mg Oral BID   pravastatin  80 mg Oral QHS   revefenacin  175 mcg Nebulization Daily    acetaminophen **OR** acetaminophen, albuterol, guaiFENesin-dextromethorphan, hydrALAZINE, ondansetron **OR** ondansetron (ZOFRAN) IV   Objective:   Vitals:   04/19/22 0700 04/19/22 0808 04/19/22 0857 04/19/22 0900  BP: (!) 166/73     Pulse: 88     Resp: 16     Temp: 98 F (36.7 C) 97.7 F (36.5 C)    TempSrc: Oral Axillary    SpO2: 95%  97% 100%  Weight:      Height:        Intake/Output Summary (  Last 24 hours) at 04/19/2022 0914 Last data filed at 04/19/2022 0800 Gross per 24 hour  Intake 199.87 ml  Output 1650 ml  Net -1450.13 ml   Filed Weights   04/14/22 0727 04/14/22 1440 04/19/22 0445  Weight: 72.6 kg 72 kg 74.3 kg     Examination:   Physical Exam  Constitution: Lethargic somnolent HEENT:        Normocephalic, PERRL, otherwise with in Normal limits  Chest:         Chest symmetric Cardio vascular:  S1/S2, RRR, No murmure, No Rubs or Gallops  pulmonary: Clear to auscultation bilaterally, respirations unlabored, negative wheezes / crackles Abdomen: Soft, non-tender, non-distended, bowel sounds,no masses, no organomegaly Muscular skeletal: Limited exam -moving all 4 extremities spontaneously in bed Neuro: Limited exam-somnolent CNII-XII intact. ,  Agitated Extremities: No pitting edema lower extremities, +2 pulses  Skin: Dry, warm to touch, negative for any Rashes, No open wounds Wounds: per nursing documentation   ------------------------------------------------------------------------------------------------------------------------------------------    LABs:     Latest Ref Rng & Units 04/18/2022    5:11 AM 04/17/2022    4:35 AM 04/16/2022    5:18 AM  CBC  WBC 4.0 - 10.5 K/uL 9.1  11.8  13.1   Hemoglobin 13.0 - 17.0 g/dL 10.9  10.0  9.4   Hematocrit 39.0 - 52.0 % 34.1   31.7  30.0   Platelets 150 - 400 K/uL 316  267  254       Latest Ref Rng & Units 04/18/2022    5:11 AM 04/17/2022    9:20 AM 04/17/2022    4:35 AM  CMP  Glucose 70 - 99 mg/dL 49   147   BUN 8 - 23 mg/dL 98   89   Creatinine 0.61 - 1.24 mg/dL 2.49   2.65   Sodium 135 - 145 mmol/L 138   134   Potassium 3.5 - 5.1 mmol/L 4.5   4.8   Chloride 98 - 111 mmol/L 109   106   CO2 22 - 32 mmol/L 19   18   Calcium 8.9 - 10.3 mg/dL 8.0   7.8   Total Protein 6.5 - 8.1 g/dL 6.4  6.4    Total Bilirubin 0.3 - 1.2 mg/dL 0.5  0.4    Alkaline Phos 38 - 126 U/L 52  55    AST 15 - 41 U/L 12  11    ALT 0 - 44 U/L 11  11         Micro Results Recent Results (from the past 240 hour(s))  Culture, blood (Routine X 2) w Reflex to ID Panel     Status: None (Preliminary result)   Collection Time: 04/16/22  7:17 AM   Specimen: BLOOD LEFT HAND  Result Value Ref Range Status   Specimen Description   Final    BLOOD LEFT HAND BOTTLES DRAWN AEROBIC AND ANAEROBIC   Special Requests Blood Culture adequate volume  Final   Culture   Final    NO GROWTH 2 DAYS Performed at [facility], [address], [place] [facility] [place], [place] [address]    Report Status PENDING  Incomplete  Culture, blood (Routine X 2) w Reflex to ID Panel     Status: None (Preliminary result)   Collection Time: 04/16/22  7:17 AM   Specimen: BLOOD RIGHT HAND  Result Value Ref Range Status   Specimen Description BLOOD RIGHT HAND BOTTLES DRAWN AEROBIC ONLY  Final   Special Requests Blood Culture adequate volume  Final   Culture   Final    NO GROWTH 2 DAYS Performed at [facility], [address], [place], [place] [address]    Report Status PENDING  Incomplete    Radiology Reports No results found.  SIGNED: Deatra James, MD, FHM. > 66 minutes of critical care time was spent evaluating this patient, reviewing all medical records, labs, meds, discussing plan of care with consulting, ICU team  [facility],  Pager (please use [email or web address] to  page/text) Please use Epic Secure Chat for non-urgent communication (7AM-7PM)  If 7PM-7AM, please contact night-coverage [email or web address], 04/19/2022, 9:14 AM

## 2022-04-19 NOTE — Hospital Course (Signed)
Edward [name] is a 87 y.o. male with medical history significant for COPD and chronic hypoxemic respiratory failure on 3 L nasal cannula who was recently discharged on 6/13 after treatment for acute COPD exacerbation and metabolic encephalopathy in the setting of pneumonia.  He was discharged with a regimen of oral antibiotics and steroid taper, but it does not appear that he actually picked up these medications.  He states that he has had some persistent dyspnea that has been worsening along with a nonproductive cough for the last 2-3 weeks.  He was readmitted with acute COPD exacerbation in the setting of persistent pneumonia.  He is also now noted to have worsening AKI while being treated with vancomycin.  He was noted to have 1 blood culture positive with MRSA.  On 7/17 he was noted to be more confused and lethargic and have a code stroke called, but there was no acute CVA noted.  He was noted to have some worsening encephalopathy.  He has continued to have worsening of his overall condition which necessitated transfer to stepdown unit for closer monitoring.  Palliative care has had further discussion with family who are now in agreement for DNR.  He appears to have very poor prognosis.

## 2022-04-19 NOTE — Progress Notes (Signed)
Palliative:  HPI: 87 y.o. male  with past medical history of COPD on 3L chronically, pulmonary fibrosis, HFpEF, atrial fibrillation, HTN, HLD, iron deficiency anemia, CKD stage 3b, diabetes, recent admission for COPD exacerbation and pneumonia 6/9-6/13 admitted on 04/14/2022 with dyspnea. Mental status change and code stroke called 7/17. MRI head negative for stroke.    I met again today at [name]'s bedside. He opens his eyes briefly when calling his name but no verbal response. He does not track or keep eyes open. He also opens his eyes spontaneously at times. He is unable to follow commands. I was able to reach son, Edward [name], after a few attempts and discussed that there has not really been any significant improvement. We discussed that prognosis is still poor. We discussed potential of transition to comfort care and even considering hospice care. Edward [name] expresses he is not sure what to do. After discussing further we agree to continue current measures for one more day and if no significant improvements by tomorrow we will transition to comfort care. Edward [name] agrees with this plan.   All questions/concerns addressed. Emotional support provided.    Exam: Opens eyes. Does not track or follow commands. Breathing still labored and irregular. Abd soft but distended.   Plan: - DNR - Continue current care for now - If no significant improvement by tomorrow will transition to comfort care  25 min  [name], NP Palliative Medicine Team Pager [phone] (Please see [email or web address] for schedule) Team Phone [phone]    Greater than 50%  of this time was spent counseling and coordinating care related to the above assessment and plan

## 2022-04-20 DIAGNOSIS — J441 Chronic obstructive pulmonary disease with (acute) exacerbation: Secondary | ICD-10-CM | POA: Diagnosis not present

## 2022-04-20 DIAGNOSIS — Z7189 Other specified counseling: Secondary | ICD-10-CM | POA: Diagnosis not present

## 2022-04-20 DIAGNOSIS — Z515 Encounter for palliative care: Secondary | ICD-10-CM | POA: Diagnosis not present

## 2022-04-20 LAB — CBC
HCT: 35.5 % — ABNORMAL LOW (ref 39.0–52.0)
Hemoglobin: 11.3 g/dL — ABNORMAL LOW (ref 13.0–17.0)
MCH: 27.4 pg (ref 26.0–34.0)
MCHC: 31.8 g/dL (ref 30.0–36.0)
MCV: 86.2 fL (ref 80.0–100.0)
Platelets: 301 10*3/uL (ref 150–400)
RBC: 4.12 MIL/uL — ABNORMAL LOW (ref 4.22–5.81)
RDW: 16.7 % — ABNORMAL HIGH (ref 11.5–15.5)
WBC: 9.7 10*3/uL (ref 4.0–10.5)
nRBC: 0 % (ref 0.0–0.2)

## 2022-04-20 LAB — COMPREHENSIVE METABOLIC PANEL
ALT: 13 U/L (ref 0–44)
AST: 16 U/L (ref 15–41)
Albumin: 2.9 g/dL — ABNORMAL LOW (ref 3.5–5.0)
Alkaline Phosphatase: 59 U/L (ref 38–126)
Anion gap: 10 (ref 5–15)
BUN: 80 mg/dL — ABNORMAL HIGH (ref 8–23)
CO2: 16 mmol/L — ABNORMAL LOW (ref 22–32)
Calcium: 8.5 mg/dL — ABNORMAL LOW (ref 8.9–10.3)
Chloride: 121 mmol/L — ABNORMAL HIGH (ref 98–111)
Creatinine, Ser: 1.67 mg/dL — ABNORMAL HIGH (ref 0.61–1.24)
GFR, Estimated: 40 mL/min — ABNORMAL LOW (ref >60)
Glucose, Bld: 112 mg/dL — ABNORMAL HIGH (ref 70–99)
Potassium: 4.4 mmol/L (ref 3.5–5.1)
Sodium: 147 mmol/L — ABNORMAL HIGH (ref 135–145)
Total Bilirubin: 0.6 mg/dL (ref 0.3–1.2)
Total Protein: 6.4 g/dL — ABNORMAL LOW (ref 6.5–8.1)

## 2022-04-20 LAB — GLUCOSE, CAPILLARY
Glucose-Capillary: 147 mg/dL — ABNORMAL HIGH (ref 70–99)
Glucose-Capillary: 186 mg/dL — ABNORMAL HIGH (ref 70–99)

## 2022-04-20 MED ORDER — ONDANSETRON HCL 4 MG PO TABS: 4.0000 mg | ORAL_TABLET | Freq: Four times a day (QID) | ORAL | 0 refills | Status: AC | PRN

## 2022-04-20 MED ORDER — GLYCOPYRROLATE 1 MG PO TABS: 1.0000 mg | ORAL_TABLET | ORAL | Status: DC | PRN

## 2022-04-20 MED ORDER — HALOPERIDOL LACTATE 5 MG/ML IJ SOLN
2.0000 mg | Freq: Four times a day (QID) | INTRAMUSCULAR | Status: DC | PRN
  Administered 2022-04-21 – 2022-04-22 (×4): 2 mg via INTRAVENOUS
  Filled 2022-04-20 (×4): qty 1

## 2022-04-20 MED ORDER — MORPHINE SULFATE (CONCENTRATE) 10 MG/0.5ML PO SOLN: 5.0000 mg | ORAL | Status: DC | PRN

## 2022-04-20 MED ORDER — METHYLPREDNISOLONE SODIUM SUCC 40 MG IJ SOLR
40.0000 mg | Freq: Every day | INTRAMUSCULAR | Status: DC
  Administered 2022-04-21: 40 mg via INTRAVENOUS
  Filled 2022-04-20: qty 1

## 2022-04-20 MED ORDER — BIOTENE DRY MOUTH MT LIQD
15.0000 mL | OROMUCOSAL | Status: DC | PRN
Start: 2022-04-20 — End: 2022-04-24

## 2022-04-20 MED ORDER — GLYCOPYRROLATE 0.2 MG/ML IJ SOLN: 0.2000 mg | INTRAMUSCULAR | Status: DC | PRN

## 2022-04-20 MED ORDER — POLYVINYL ALCOHOL 1.4 % OP SOLN: 1.0000 [drp] | Freq: Four times a day (QID) | OPHTHALMIC | Status: DC | PRN

## 2022-04-20 MED ORDER — MORPHINE SULFATE (CONCENTRATE) 10 MG/0.5ML PO SOLN
5.0000 mg | ORAL | Status: DC | PRN
  Administered 2022-04-21 – 2022-04-22 (×5): 5 mg via SUBLINGUAL
  Filled 2022-04-20 (×5): qty 0.5

## 2022-04-20 MED ORDER — ACETAMINOPHEN 325 MG PO TABS: 650.0000 mg | ORAL_TABLET | Freq: Four times a day (QID) | ORAL | 0 refills | Status: AC | PRN

## 2022-04-20 NOTE — Progress Notes (Signed)
[id number] call time 0911 beeper time 0923 exam started 0928 exam finished 0928 images sent to [place] exam completed in epic  0929 [facility] radiology called

## 2022-04-20 NOTE — Plan of Care (Signed)
Pt eye opening, nonverbal, not interactive, not following commands. On call contacted overnight re: increased agitation, pulling at lines, frequent attempts to leave bed - not redirectable. X1 Haldol given, effective.    Problem: Education: Goal: Ability to describe self-care measures that may prevent or decrease complications (Diabetes Survival Skills Education) will improve Outcome: Progressing Goal: Individualized Educational Video(s) Outcome: Progressing   Problem: Coping: Goal: Ability to adjust to condition or change in health will improve Outcome: Progressing   Problem: Fluid Volume: Goal: Ability to maintain a balanced intake and output will improve Outcome: Progressing   Problem: Health Behavior/Discharge Planning: Goal: Ability to identify and utilize available resources and services will improve Outcome: Progressing Goal: Ability to manage health-related needs will improve Outcome: Progressing   Problem: Metabolic: Goal: Ability to maintain appropriate glucose levels will improve Outcome: Progressing   Problem: Nutritional: Goal: Maintenance of adequate nutrition will improve Outcome: Progressing Goal: Progress toward achieving an optimal weight will improve Outcome: Progressing   Problem: Skin Integrity: Goal: Risk for impaired skin integrity will decrease Outcome: Progressing   Problem: Tissue Perfusion: Goal: Adequacy of tissue perfusion will improve Outcome: Progressing   Problem: Education: Goal: Knowledge of General Education information will improve Description: Including pain rating scale, medication(s)/side effects and non-pharmacologic comfort measures Outcome: Progressing   Problem: Health Behavior/Discharge Planning: Goal: Ability to manage health-related needs will improve Outcome: Progressing   Problem: Clinical Measurements: Goal: Ability to maintain clinical measurements within normal limits will improve Outcome: Progressing Goal: Will  remain free from infection Outcome: Progressing Goal: Diagnostic test results will improve Outcome: Progressing Goal: Respiratory complications will improve Outcome: Progressing Goal: Cardiovascular complication will be avoided Outcome: Progressing   Problem: Activity: Goal: Risk for activity intolerance will decrease Outcome: Progressing   Problem: Nutrition: Goal: Adequate nutrition will be maintained Outcome: Progressing   Problem: Coping: Goal: Level of anxiety will decrease Outcome: Progressing   Problem: Elimination: Goal: Will not experience complications related to bowel motility Outcome: Progressing Goal: Will not experience complications related to urinary retention Outcome: Progressing   Problem: Pain Managment: Goal: General experience of comfort will improve Outcome: Progressing   Problem: Safety: Goal: Ability to remain free from injury will improve Outcome: Progressing   Problem: Skin Integrity: Goal: Risk for impaired skin integrity will decrease Outcome: Progressing   Problem: Education: Goal: Knowledge of General Education information will improve Description: Including pain rating scale, medication(s)/side effects and non-pharmacologic comfort measures Outcome: Progressing   Problem: Health Behavior/Discharge Planning: Goal: Ability to manage health-related needs will improve Outcome: Progressing   Problem: Clinical Measurements: Goal: Ability to maintain clinical measurements within normal limits will improve Outcome: Progressing Goal: Will remain free from infection Outcome: Progressing Goal: Diagnostic test results will improve Outcome: Progressing Goal: Respiratory complications will improve Outcome: Progressing Goal: Cardiovascular complication will be avoided Outcome: Progressing   Problem: Activity: Goal: Risk for activity intolerance will decrease Outcome: Progressing   Problem: Nutrition: Goal: Adequate nutrition will be  maintained Outcome: Progressing   Problem: Coping: Goal: Level of anxiety will decrease Outcome: Progressing   Problem: Elimination: Goal: Will not experience complications related to bowel motility Outcome: Progressing Goal: Will not experience complications related to urinary retention Outcome: Progressing   Problem: Pain Managment: Goal: General experience of comfort will improve Outcome: Progressing   Problem: Safety: Goal: Ability to remain free from injury will improve Outcome: Progressing   Problem: Skin Integrity: Goal: Risk for impaired skin integrity will decrease Outcome: Progressing

## 2022-04-20 NOTE — TOC Progression Note (Signed)
Transition of Care [facility]) - Progression Note    Patient Details  Name: TIEN SPOONER MRN: [id number] Date of Birth: October 10, 1934  Transition of [name] CM/SW Contact  Ihor Gully, LCSW Phone Number: 04/20/2022, 1:29 PM  Clinical Narrative:    Per Palliative care NP request referral made to [place].  Hospice nurse to assess patient at 2:30 today. If he is deemed appropriate he will be GIP as [facility] has no beds.    Expected Discharge Plan: [place] Barriers to Discharge: Continued Medical Work up  Expected Discharge Plan and Services Expected Discharge Plan: [place]         Expected Discharge Date: 04/20/22                                     Social Determinants of Health (SDOH) Interventions    Readmission Risk Interventions    04/20/2020    2:57 PM 04/19/2020    4:27 PM 04/19/2020   11:18 AM  Readmission Risk Prevention Plan  Transportation Screening   Complete  Medication Review (RN Care Manager)   Complete  PCP or Specialist appointment within 3-5 days of discharge Not Complete    PCP/Specialist Appt Not Complete comments appointment is scheduled for 04/26/20 which is six days. Labs to are to be drawn every Monday per attending request.    [place] or [place]  Complete   SW Recovery Care/Counseling Consult   Complete  Palliative Care Screening   Not Applicable

## 2022-04-20 NOTE — Progress Notes (Signed)
Palliative:  HPI: 87 y.o. male  with past medical history of COPD on 3L chronically, pulmonary fibrosis, HFpEF, atrial fibrillation, HTN, HLD, iron deficiency anemia, CKD stage 3b, diabetes, recent admission for COPD exacerbation and pneumonia 6/9-6/13 admitted on 04/14/2022 with dyspnea. Mental status change and code stroke called 7/17. MRI head negative for stroke.     I met today at Edward [name]' bedside along with his wife, son Dana, and family friend. Trypp does open his eyes and will track for a minute. He attempts to speak but unable to understand. This is similar to his examination when I first met Chevis Monday. He continues without significant improvement or progression. He had severe agitation overnight. I spoke with family at bedside and explained that although there are very brief signs of improvement overall prognosis remains very poor. Wife shares that Eban would sleep the majority of the time at home until she reminded him to eat and then he would eat and go back to sleep. However, family also report he would drive and work in his shop at times. I have a feeling that he had very limited reserve and very poor status at baseline. I explained to family that I do not feel we will be able to get him as good as he was before. We discussed concern that he cannot express if he is having pain or discomfort and unable to express his needs. At this time they are open to comfort care and hospice placement. They agree with transition to [facility] for end of life care. Will continue some IV fluids while still hospitalized at their request but they understand these will not be provided in hospice but they will ensure that he is comfortable. Wife confirms that they have made some arrangements for [facility] and Dick. Wife reflects that nobody is here forever.   All questions/concerns addressed. Emotional support provided.   Exam: Slightly more responsive but unable to follow commands. Restless. No distress. Breathing  regular, unlabored. Abd soft but distended. Warm to touch.   Plan: - DNR - Transition to comfort care (continue some IV fluids for now) - Family agree with transition to [facility] hospice - Prognosis poor likely days to a week - PRN medication added to better provide comfort  35 min   , NP Palliative Medicine Team Pager [phone] (Please see [email or web address] for schedule) Team Phone [phone]    Greater than 50%  of this time was spent counseling and coordinating care related to the above assessment and plan   

## 2022-04-20 NOTE — Discharge Summary (Signed)
Physician Discharge Summary   Patient: Edward [name] MRN: [id number] DOB: [birth year]  Admit date:     04/14/2022  Discharge date: 04/20/22  Discharge Physician: Deatra James   PCP: Susy Frizzle, MD   Recommendations at discharge:  Follow-up with hospice -[facility] hospice house  Discharge Diagnoses: Principal Problem:   COPD with acute exacerbation ([place]) Active Problems:   Hypertension   DM type 2 causing vascular disease ([place])   Chronic diastolic CHF (congestive heart failure) ([name])   HCAP (healthcare-associated pneumonia)   AF (paroxysmal atrial fibrillation) ([name])   Hyponatremia  Resolved Problems:   * No resolved hospital problems. [facility] Course: Edward [name] is a 87 y.o. male with medical history significant for COPD and chronic hypoxemic respiratory failure on 3 L nasal cannula who was recently discharged on 6/13 after treatment for acute COPD exacerbation and metabolic encephalopathy in the setting of pneumonia.  He was discharged with a regimen of oral antibiotics and steroid taper, but it does not appear that he actually picked up these medications.  He states that he has had some persistent dyspnea that has been worsening along with a nonproductive cough for the last 2-3 weeks.  He was readmitted with acute COPD exacerbation in the setting of persistent pneumonia.  He is also now noted to have worsening AKI while being treated with vancomycin.  He was noted to have 1 blood culture positive with MRSA.  On 7/17 he was noted to be more confused and lethargic and have a code stroke called, but there was no acute CVA noted.  He was noted to have some worsening encephalopathy.  He has continued to have worsening of his overall condition which necessitated transfer to stepdown unit for closer monitoring.  Palliative care has had further discussion with family who are now in agreement for DNR.  He appears to have very poor prognosis.      Acute respiratory failure due  to acute COPD exacerbation with concern for persistent pneumonia Discontinued IV steroids, antitussives, nebs, During hospitalization: -was on IV Solu-Medrol twice daily  -DuoNebs -Antitussives -Vancomycin and cefepime empirically for HCAP   -Currently on baseline 3 L nasal cannula oxygen satting 97% -Consult to palliative care per PCCM recommendations; patient overall with very poor prognosis  Family has agreed to progress to comfort care, Hospice    Acute metabolic encephalopathy -Worsening encephalopathy,  Minimal verbal conversation, noncooperative, --PCO2 and ammonia within normal limits -Code stroke called 7/17 with head CT and brain MRI with no acute findings -Likely due to Lyrica use in the setting of AKI and this has not been discontinued -Continue to monitor closely for improvement   MRSA bacteremia-unlikely -Repeat blood cultures negative -Vancomycin held for now given AKI   History of paroxysmal atrial fibrillation -Continue Eliquis at home and metoprolol for heart rate control   Chronic diastolic CHF -2D echocardiogram 1 month ago with EF 60-65% and grade 2 diastolic dysfunction -Hold Lasix for now and give gentle IV fluid given AKI   AKI on CKD stage IIIb-improving -Likely ATN due to vancomycin toxicity -Renal ultrasound with no acute findings -Hold further IV fluid for now -Repeat a.m. labs   Normocytic anemia -Continue to monitor   Hypertension -Continue home medications and monitor   Type 2 diabetes with hypoglycemia -A1c 13.6% on 6/9 -Discontinue Actos -Decreased Semglee--- now discontinued     Deconditioning/debility Nonambulatory at this point needing total care   Ethics: Patient is Now DNR/DNI, family agreed to pursue  with comfort care and' \\Hospice'$  Palliative care team has been following closely with the patient and patient's family       Consultants: Pulmonary PCCM, palliative care team Procedures performed: None Disposition: Hospice  care Diet recommendation:  Discharge Diet Orders (From admission, onward)     Start     Ordered   04/20/22 0000  Diet - low sodium heart healthy        04/20/22 1142           Regular diet DISCHARGE MEDICATION: Allergies as of 04/20/2022       Reactions   Ace Inhibitors Other (See Comments)   Hyperkalemia--07/23/2013:patient states not familiar with the following allergy        Medication List     STOP taking these medications    albuterol (2.5 MG/3ML) 0.083% nebulizer solution Commonly known as: PROVENTIL   apixaban 2.5 MG Tabs tablet Commonly known as: Eliquis   cloNIDine 0.1 MG tablet Commonly known as: CATAPRES   fluticasone-salmeterol 500-50 MCG/ACT Aepb Commonly known as: Wixela Inhub   furosemide 40 MG tablet Commonly known as: LASIX   Lantus SoloStar 100 UNIT/ML Solostar Pen Generic drug: insulin glargine   metoprolol tartrate 25 MG tablet Commonly known as: LOPRESSOR   OneTouch Delica Plus [id number] Misc   OneTouch Ultra test strip Generic drug: glucose blood   OXYGEN   pioglitazone 30 MG tablet Commonly known as: Actos   potassium chloride SA 20 MEQ tablet Commonly known as: KLOR-CON M   pravastatin 80 MG tablet Commonly known as: PRAVACHOL   pregabalin 100 MG capsule Commonly known as: LYRICA   Spiriva HandiHaler 18 MCG inhalation capsule Generic drug: tiotropium       TAKE these medications    acetaminophen 325 MG tablet Commonly known as: TYLENOL Take 2 tablets (650 mg total) by mouth every 6 (six) hours as needed for mild pain (or Fever >/= 101).   glycopyrrolate 1 MG tablet Commonly known as: ROBINUL Take 1 tablet (1 mg total) by mouth every 4 (four) hours as needed (excessive secretions).   glycopyrrolate 0.2 MG/ML injection Commonly known as: ROBINUL Inject 1 mL (0.2 mg total) into the skin every 4 (four) hours as needed (excessive secretions).   ondansetron 4 MG tablet Commonly known as: ZOFRAN Take 1 tablet  (4 mg total) by mouth every 6 (six) hours as needed for nausea.        Discharge Exam: Filed Weights   04/14/22 1440 04/19/22 0445 04/20/22 0500  Weight: 72 kg 74.3 kg 72.2 kg      Physical Exam:   General:  AAO x 1, agitated, restless no verbal communication  HEENT:  Normocephalic, PERRL, otherwise with in Normal limits   Neuro:  CNII-XII intact. , normal motor and sensation, reflexes intact   Lungs:   Visible shortness of breath, wheezing, rhonchi  Cardio:    S1/S2, RRR, No murmure, No Rubs or Gallops   Abdomen:  Soft, non-tender, bowel sounds active all four quadrants, no guarding or peritoneal signs.  Muscular  skeletal:  Limited exam -global generalized weaknesses - in bed, able to move all 4 extremities,   2+ pulses,  symmetric, No pitting edema  Skin:  Dry, warm to touch, negative for any Rashes,  Wounds: Please see nursing documentation      Condition at discharge: poor  The results of significant diagnostics from this hospitalization (including imaging, microbiology, ancillary and laboratory) are listed below for reference.   Imaging Studies: DG CHEST PORT 1  VIEW  Result Date: 04/17/2022 CLINICAL DATA:  Increasing shortness of breath and productive cough in a patient with history of diabetes a pulmonary fibrosis EXAM: PORTABLE CHEST 1 VIEW COMPARISON:  April 14, 2022. FINDINGS: EKG leads project over the chest. Cardiomediastinal contours and hilar structures are stable with persistent cardiac enlargement accentuated by AP projection and portable technique. Increasing density within LEFT upper lobe airspace disease. No new area of consolidation with scattered linear areas of atelectasis in the chest. No pneumothorax. On limited assessment no acute skeletal findings. IMPRESSION: Increasing density within the LEFT upper lobe airspace disease. Findings remain compatible with pneumonia at this time but would suggest follow-up to ensure resolution. Increasing lead defined or  dense appearance could reflect evolution of the infectious process, not currently as diffuse as on the prior study. Electronically Signed   By: Zetta Bills M.D.   On: 04/17/2022 14:56   MR ANGIO HEAD WO CONTRAST  Result Date: 04/17/2022 CLINICAL DATA:  Neuro deficit, acute, stroke suspected. Encephalopathy and generalized weakness. EXAM: MRI HEAD WITHOUT CONTRAST MRA HEAD WITHOUT CONTRAST MRA NECK WITHOUT CONTRAST TECHNIQUE: Multiplanar, multiecho pulse sequences of the brain and surrounding structures were obtained without intravenous contrast. Angiographic images of the Circle of Willis were obtained using MRA technique without intravenous contrast. Angiographic images of the neck were obtained using MRA technique without intravenous contrast. Carotid stenosis measurements (when applicable) are obtained utilizing NASCET criteria, using the distal internal carotid diameter as the denominator. COMPARISON:  Head CT 04/17/2022.  Head MRI and MRA 09/26/2017. FINDINGS: MRI HEAD FINDINGS Multiple sequences are mildly to moderately motion degraded. Brain: There is no evidence of an acute infarct, intracranial hemorrhage, mass, midline shift, or extra-axial fluid collection. T2 hyperintensities in the cerebral white matter bilaterally are similar to the prior MRI and are nonspecific but compatible with mild chronic small vessel ischemic disease. There is moderate cerebral atrophy. Vascular: Major intracranial vascular flow voids are preserved. Skull and upper cervical spine: Unremarkable bone marrow signal. Sinuses/Orbits: Bilateral cataract extraction. Clear paranasal sinuses. Small left mastoid effusion. Other: None. MRA HEAD FINDINGS The included portions of the intracranial vertebral arteries are widely patent to the basilar. Patent PICA and SCA origins are visualized bilaterally. The basilar artery is widely patent. Posterior communicating arteries are diminutive or absent. Both PCAs are patent with multifocal  severe right P2 stenoses which are new from the 2018 MRA. There is up to mild left P2 stenosis. The internal carotid arteries are patent from skull base to carotid termini without definite evidence of a high-grade stenosis, although motion artifact limits assessment. ACAs and MCAs are patent without evidence of a proximal branch occlusion or high-grade proximal stenosis. No aneurysm is identified. MRA NECK FINDINGS Evaluation is limited by extensive motion artifact and noncontrast technique. There is nondiagnostic assessment of the aortic arch, brachiocephalic and subclavian arteries, and proximal common carotid and vertebral arteries. Flow is present in the mid and distal common carotid arteries, cervical internal carotid arteries, and distal V1 through V4 segments of the vertebral arteries, however motion artifact precludes assessment for stenosis. IMPRESSION: 1. No acute intracranial abnormality. 2. Mild chronic small vessel ischemic disease and moderate cerebral atrophy. 3. Severely limited neck MRA due to motion artifact. Grossly patent cervical carotid and vertebral arteries. 4. No major intracranial arterial occlusion. Severe right P2 stenoses. Electronically Signed   By: Logan Bores M.D.   On: 04/17/2022 13:13   MR ANGIO NECK WO CONTRAST  Result Date: 04/17/2022 CLINICAL DATA:  Neuro deficit, acute,  stroke suspected. Encephalopathy and generalized weakness. EXAM: MRI HEAD WITHOUT CONTRAST MRA HEAD WITHOUT CONTRAST MRA NECK WITHOUT CONTRAST TECHNIQUE: Multiplanar, multiecho pulse sequences of the brain and surrounding structures were obtained without intravenous contrast. Angiographic images of the Circle of Willis were obtained using MRA technique without intravenous contrast. Angiographic images of the neck were obtained using MRA technique without intravenous contrast. Carotid stenosis measurements (when applicable) are obtained utilizing NASCET criteria, using the distal internal carotid diameter as  the denominator. COMPARISON:  Head CT 04/17/2022.  Head MRI and MRA 09/26/2017. FINDINGS: MRI HEAD FINDINGS Multiple sequences are mildly to moderately motion degraded. Brain: There is no evidence of an acute infarct, intracranial hemorrhage, mass, midline shift, or extra-axial fluid collection. T2 hyperintensities in the cerebral white matter bilaterally are similar to the prior MRI and are nonspecific but compatible with mild chronic small vessel ischemic disease. There is moderate cerebral atrophy. Vascular: Major intracranial vascular flow voids are preserved. Skull and upper cervical spine: Unremarkable bone marrow signal. Sinuses/Orbits: Bilateral cataract extraction. Clear paranasal sinuses. Small left mastoid effusion. Other: None. MRA HEAD FINDINGS The included portions of the intracranial vertebral arteries are widely patent to the basilar. Patent PICA and SCA origins are visualized bilaterally. The basilar artery is widely patent. Posterior communicating arteries are diminutive or absent. Both PCAs are patent with multifocal severe right P2 stenoses which are new from the 2018 MRA. There is up to mild left P2 stenosis. The internal carotid arteries are patent from skull base to carotid termini without definite evidence of a high-grade stenosis, although motion artifact limits assessment. ACAs and MCAs are patent without evidence of a proximal branch occlusion or high-grade proximal stenosis. No aneurysm is identified. MRA NECK FINDINGS Evaluation is limited by extensive motion artifact and noncontrast technique. There is nondiagnostic assessment of the aortic arch, brachiocephalic and subclavian arteries, and proximal common carotid and vertebral arteries. Flow is present in the mid and distal common carotid arteries, cervical internal carotid arteries, and distal V1 through V4 segments of the vertebral arteries, however motion artifact precludes assessment for stenosis. IMPRESSION: 1. No acute  intracranial abnormality. 2. Mild chronic small vessel ischemic disease and moderate cerebral atrophy. 3. Severely limited neck MRA due to motion artifact. Grossly patent cervical carotid and vertebral arteries. 4. No major intracranial arterial occlusion. Severe right P2 stenoses. Electronically Signed   By: Logan Bores M.D.   On: 04/17/2022 13:13   MR BRAIN WO CONTRAST  Result Date: 04/17/2022 CLINICAL DATA:  Neuro deficit, acute, stroke suspected. Encephalopathy and generalized weakness. EXAM: MRI HEAD WITHOUT CONTRAST MRA HEAD WITHOUT CONTRAST MRA NECK WITHOUT CONTRAST TECHNIQUE: Multiplanar, multiecho pulse sequences of the brain and surrounding structures were obtained without intravenous contrast. Angiographic images of the Circle of Willis were obtained using MRA technique without intravenous contrast. Angiographic images of the neck were obtained using MRA technique without intravenous contrast. Carotid stenosis measurements (when applicable) are obtained utilizing NASCET criteria, using the distal internal carotid diameter as the denominator. COMPARISON:  Head CT 04/17/2022.  Head MRI and MRA 09/26/2017. FINDINGS: MRI HEAD FINDINGS Multiple sequences are mildly to moderately motion degraded. Brain: There is no evidence of an acute infarct, intracranial hemorrhage, mass, midline shift, or extra-axial fluid collection. T2 hyperintensities in the cerebral white matter bilaterally are similar to the prior MRI and are nonspecific but compatible with mild chronic small vessel ischemic disease. There is moderate cerebral atrophy. Vascular: Major intracranial vascular flow voids are preserved. Skull and upper cervical spine: Unremarkable bone marrow  signal. Sinuses/Orbits: Bilateral cataract extraction. Clear paranasal sinuses. Small left mastoid effusion. Other: None. MRA HEAD FINDINGS The included portions of the intracranial vertebral arteries are widely patent to the basilar. Patent PICA and SCA origins  are visualized bilaterally. The basilar artery is widely patent. Posterior communicating arteries are diminutive or absent. Both PCAs are patent with multifocal severe right P2 stenoses which are new from the 2018 MRA. There is up to mild left P2 stenosis. The internal carotid arteries are patent from skull base to carotid termini without definite evidence of a high-grade stenosis, although motion artifact limits assessment. ACAs and MCAs are patent without evidence of a proximal branch occlusion or high-grade proximal stenosis. No aneurysm is identified. MRA NECK FINDINGS Evaluation is limited by extensive motion artifact and noncontrast technique. There is nondiagnostic assessment of the aortic arch, brachiocephalic and subclavian arteries, and proximal common carotid and vertebral arteries. Flow is present in the mid and distal common carotid arteries, cervical internal carotid arteries, and distal V1 through V4 segments of the vertebral arteries, however motion artifact precludes assessment for stenosis. IMPRESSION: 1. No acute intracranial abnormality. 2. Mild chronic small vessel ischemic disease and moderate cerebral atrophy. 3. Severely limited neck MRA due to motion artifact. Grossly patent cervical carotid and vertebral arteries. 4. No major intracranial arterial occlusion. Severe right P2 stenoses. Electronically Signed   By: Logan Bores M.D.   On: 04/17/2022 13:13   CT HEAD CODE STROKE WO CONTRAST  Result Date: 04/17/2022 CLINICAL DATA:  Code stroke.  Neuro deficit, acute, stroke suspected EXAM: CT HEAD WITHOUT CONTRAST TECHNIQUE: Contiguous axial images were obtained from the base of the skull through the vertex without intravenous contrast. RADIATION DOSE REDUCTION: This exam was performed according to the departmental dose-optimization program which includes automated exposure control, adjustment of the mA and/or kV according to patient size and/or use of iterative reconstruction technique.  COMPARISON:  CT head July 07, 2021. FINDINGS: Brain: No evidence of acute large vascular territory infarction, hemorrhage, hydrocephalus, extra-axial collection or mass lesion/mass effect. Similar chronic microvascular disease and cerebral atrophy. Vascular: No hyperdense vessel identified. Skull: No acute fracture. Sinuses/Orbits: Left maxillary sinus mucosal thinking. No acute orbital findings Other: None. ASPECTS [facility] Stroke Program Early CT Score) total score (0-10 with 10 being normal): 10. IMPRESSION: 1. No evidence of acute intracranial abnormality. 2. ASPECTS is 10. Code stroke imaging results were communicated on 04/17/2022 at 9:35 am to provider Apolonio Schneiders via telephone, who verbally acknowledged these results. Electronically Signed   By: Margaretha Sheffield M.D.   On: 04/17/2022 09:37   US RENAL  Result Date: 04/16/2022 CLINICAL DATA:  Acute renal insufficiency EXAM: RENAL / URINARY TRACT ULTRASOUND COMPLETE COMPARISON:  None Available. FINDINGS: Right Kidney: Renal measurements: 9.3 x 5.6 x 4.8 cm = volume: 131 mL. Increased cortical echogenicity. Left Kidney: Renal measurements: 9.6 x 4.7 x 4.4 cm = volume: 104.4 mL. Increased cortical echogenicity. Bladder: Appears normal for degree of bladder distention. Other: None. IMPRESSION: Medical renal disease with increased cortical echogenicity. No acute abnormalities. Electronically Signed   By: Dorise Bullion III M.D.   On: 04/16/2022 12:04   CT Chest W Contrast  Result Date: 04/14/2022 CLINICAL DATA:  Shortness of breath for 2 weeks. Abnormal chest x-ray with persistent left lung opacities. Evaluate for soft tissue mass. EXAM: CT CHEST WITH CONTRAST TECHNIQUE: Multidetector CT imaging of the chest was performed during intravenous contrast administration. RADIATION DOSE REDUCTION: This exam was performed according to the departmental dose-optimization program which includes  automated exposure control, adjustment of the mA and/or kV according to  patient size and/or use of iterative reconstruction technique. CONTRAST:  58m OMNIPAQUE IOHEXOL 300 MG/ML  SOLN COMPARISON:  Multiple radiographs, most recently 04/14/2022 and 03/20/2022. CT 02/28/2017. FINDINGS: Cardiovascular: Diffuse atherosclerosis of the aorta, great vessels and coronary arteries. No acute vascular findings are evident. There are calcifications of the aortic valve. The heart size is normal. There is no pericardial effusion. Mediastinum/Nodes: There are no enlarged mediastinal, hilar or axillary lymph nodes.Mildly prominent left infrahilar lymph nodes, likely reactive. The thyroid gland, trachea and esophagus demonstrate no significant findings. Lungs/Pleura: Trace left pleural effusion. No right pleural effusion or pneumothorax. Underlying moderate centrilobular and paraseptal emphysema. On the remote prior chest CT, there were extensive right upper and bilateral lower lobe infiltrates which have resolved. Mild residual right lung scarring is noted. There is focal airspace disease peripherally and anteriorly in the left upper lobe, as seen on recent radiographs. These opacities were first seen 03/10/2022 and are new from 02/07/2022, and therefore consistent with pneumonia. In review of prior radiographs, there appears to be mild improvement from 03/10/2022. There is some associated central airway thickening and luminal narrowing, and as above, probable reactive left hilar adenopathy. No focal lung mass identified to suggest malignancy. Upper abdomen: The visualized upper abdomen appears stable without acute findings. There is fatty replacement of the pancreas and aortic atherosclerosis. Musculoskeletal/Chest wall: There is no chest wall mass or suspicious osseous finding. Bilateral gynecomastia, similar to previous study. Multilevel spondylosis. IMPRESSION: 1. Persistent left upper lobe airspace disease, new from radiographs of 02/07/2022, but incompletely resolved. Findings remain most  consistent with pneumonia. Continued radiographic follow-up in 4-6 weeks recommended. 2. No focal lung mass identified to suggest malignancy. There is left hilar adenopathy which is nonspecific, although likely reactive. Mild associated central airway thickening and luminal narrowing. 3. Coronary and aortic atherosclerosis (ICD10-I70.0). Emphysema (ICD10-J43.9). Electronically Signed   By: WRichardean SaleM.D.   On: 04/14/2022 09:24   DG Chest 2 View  Result Date: 04/14/2022 CLINICAL DATA:  SOB EXAM: CHEST - 2 VIEW COMPARISON:  Chest x-ray June 19, 23. FINDINGS: Slightly progressed interstitial and airspace opacities in the left mid and upper lung. No visible pleural effusions pneumothorax. Similar cardiomediastinal silhouette. No acute osseous abnormality. IMPRESSION: Slightly progressed interstitial and airspace opacities in the left mid and upper lung, concerning for pneumonia. Given persistence, recommend CT chest (preferably with contrast) to further evaluate and exclude alternative etiologies such as malignancy. Electronically Signed   By: FMargaretha SheffieldM.D.   On: 04/14/2022 08:31    Microbiology: Results for orders placed or performed during the hospital encounter of 04/14/22  Culture, blood (Routine X 2) w Reflex to ID Panel     Status: None (Preliminary result)   Collection Time: 04/16/22  7:17 AM   Specimen: BLOOD LEFT HAND  Result Value Ref Range Status   Specimen Description   Final    BLOOD LEFT HAND BOTTLES DRAWN AEROBIC AND ANAEROBIC   Special Requests Blood Culture adequate volume  Final   Culture   Final    NO GROWTH 4 DAYS [facility] [address] [place] [address]   Report Status PENDING  Incomplete  Culture, blood (Routine X 2) w Reflex to ID Panel     Status: None (Preliminary result)   Collection Time: 04/16/22  7:17 AM   Specimen: BLOOD RIGHT HAND  Result Value Ref Range Status   Specimen Description BLOOD  RIGHT HAND BOTTLES DRAWN AEROBIC  ONLY  Final   Special Requests Blood Culture adequate volume  Final   Culture   Final    NO GROWTH 4 DAYS Performed at [facility], [address], [place], [place] [address]    Report Status PENDING  Incomplete  MRSA Next Gen by PCR, Nasal     Status: Abnormal   Collection Time: 04/19/22 11:24 AM   Specimen: Nasal Mucosa; Nasal Swab  Result Value Ref Range Status   MRSA by PCR Next Gen DETECTED (A) NOT DETECTED Final    Comment: RESULT CALLED TO, READ BACK BY AND VERIFIED WITH: JEFF STRENK @ 1409 ON 04/19/22 C VARNER (NOTE) The GeneXpert MRSA Assay (FDA approved for NASAL specimens only), is one component of a comprehensive MRSA colonization surveillance program. It is not intended to diagnose MRSA infection nor to guide or monitor treatment for MRSA infections. Test performance is not FDA approved in patients less than 51 years old. Performed at [facility], [address] [place], [place] [address]     Labs: CBC: Recent Labs  Lab 04/14/22 [phone] 04/15/22 0653 04/16/22 0518 04/17/22 0435 04/18/22 0511 04/20/22 0403  WBC 7.0 9.4 13.1* 11.8* 9.1 9.7  NEUTROABS 4.3  --   --   --   --   --   HGB 11.9* 10.3* 9.4* 10.0* 10.9* 11.3*  HCT 37.1* 32.4* 30.0* 31.7* 34.1* 35.5*  MCV 87.3 87.8 88.5 88.8 87.2 86.2  PLT 272 274 254 267 316 803   Basic Metabolic Panel: Recent Labs  Lab 04/15/22 0653 04/16/22 0518 04/17/22 0435 04/18/22 0511 04/20/22 0403  NA 135 132* 134* 138 147*  K 4.6 4.6 4.8 4.5 4.4  CL 103 103 106 109 121*  CO2 25 20* 18* 19* 16*  GLUCOSE 187* 192* 147* 49* 112*  BUN 49* 72* 89* 98* 80*  CREATININE 1.46* 2.16* 2.65* 2.49* 1.67*  CALCIUM 8.3* 8.0* 7.8* 8.0* 8.5*  MG 2.7* 2.7* 2.8* 3.0*  --    Liver Function Tests: Recent Labs  Lab 04/14/22 0743 04/17/22 0920 04/18/22 0511 04/20/22 0403  AST 15 11* 12* 16  ALT '12 11 11 13  '$ ALKPHOS 74 55 52 59  BILITOT 0.3 0.4 0.5 0.6  PROT 7.4 6.4* 6.4* 6.4*  ALBUMIN 3.3* 2.8* 2.7* 2.9*   CBG: Recent Labs   Lab 04/19/22 1152 04/19/22 1553 04/19/22 2105 04/20/22 0731 04/20/22 1128  GLUCAP 70 72 83 147* 186*    Discharge time spent: greater than 30 minutes.  Signed: Deatra James, MD [facility] 04/20/2022

## 2022-04-21 DIAGNOSIS — J441 Chronic obstructive pulmonary disease with (acute) exacerbation: Secondary | ICD-10-CM | POA: Diagnosis not present

## 2022-04-21 LAB — CULTURE, BLOOD (ROUTINE X 2)
Culture: NO GROWTH
Culture: NO GROWTH
Special Requests: ADEQUATE
Special Requests: ADEQUATE

## 2022-04-21 MED ORDER — METOPROLOL SUCCINATE ER 50 MG PO TB24
50.0000 mg | ORAL_TABLET | Freq: Every day | ORAL | Status: DC
  Administered 2022-04-21: 50 mg via ORAL
  Filled 2022-04-21: qty 1

## 2022-04-21 MED ORDER — METOPROLOL SUCCINATE ER 50 MG PO TB24: 50.0000 mg | ORAL_TABLET | Freq: Every day | ORAL | 0 refills | Status: AC

## 2022-04-21 NOTE — Care Management Important Message (Signed)
Important Message  Patient Details  Name: Edward [name] MRN: [id number] Date of Birth: 03/04/1935   Medicare Important Message Given:  Other (see comment)  Comfort care measures.  Medicare IM withheld at this time out of respect for patient and family.   Dannette Barbara 04/21/2022, 10:09 AM

## 2022-04-21 NOTE — Progress Notes (Signed)
Physician Discharge Summary   Patient: Edward [name] MRN: [id number] DOB: 08-May-1935  Admit date:     04/14/2022  Discharge date: 04/21/22  Discharge Physician: Deatra James   PCP: Susy Frizzle, MD     The patient was seen and examined.  Stable for discharge    Pending discharge to hospice -awaiting bed availability    Recommendations at discharge:  Follow-up with hospice -[facility] hospice house  Discharge Diagnoses: Principal Problem:   COPD with acute exacerbation ([place]) Active Problems:   Hypertension   DM type 2 causing vascular disease ([place])   Chronic diastolic CHF (congestive heart failure) ([name])   HCAP (healthcare-associated pneumonia)   AF (paroxysmal atrial fibrillation) ([name])   Hyponatremia  Resolved Problems:   * No resolved hospital problems. [facility] Hospital Course: Edward [name] is a 87 y.o. male with medical history significant for COPD and chronic hypoxemic respiratory failure on 3 L nasal cannula who was recently discharged on 6/13 after treatment for acute COPD exacerbation and metabolic encephalopathy in the setting of pneumonia.  He was discharged with a regimen of oral antibiotics and steroid taper, but it does not appear that he actually picked up these medications.  He states that he has had some persistent dyspnea that has been worsening along with a nonproductive cough for the last 2-3 weeks.  He was readmitted with acute COPD exacerbation in the setting of persistent pneumonia.  He is also now noted to have worsening AKI while being treated with vancomycin.  He was noted to have 1 blood culture positive with MRSA.  On 7/17 he was noted to be more confused and lethargic and have a code stroke called, but there was no acute CVA noted.  He was noted to have some worsening encephalopathy.  He has continued to have worsening of his overall condition which necessitated transfer to stepdown unit for closer monitoring.  Palliative care has had further  discussion with family who are now in agreement for DNR.  He appears to have very poor prognosis.      Acute respiratory failure due to acute COPD exacerbation with concern for persistent pneumonia Discontinued IV steroids, antitussives, nebs, During hospitalization: -was on IV Solu-Medrol twice daily  -DuoNebs -Antitussives -Vancomycin and cefepime empirically for HCAP   -Currently on baseline 3 L nasal cannula oxygen satting 97% -Consult to palliative care per PCCM recommendations; patient overall with very poor prognosis  Family has agreed to progress to comfort care, Hospice    Acute metabolic encephalopathy -Waxing and waning Minimal verbal conversation, noncooperative, --PCO2 and ammonia within normal limits -Code stroke called 7/17 with head CT and brain MRI with no acute findings -Likely due to Lyrica use in the setting of AKI and this has not been discontinued -Continue to monitor closely for improvement   MRSA bacteremia-unlikely -Repeat blood cultures negative -Vancomycin held for now given AKI   History of paroxysmal atrial fibrillation -Continue Eliquis at home and metoprolol for heart rate control   Chronic diastolic CHF -2D echocardiogram 1 month ago with EF 60-65% and grade 2 diastolic dysfunction -Hold Lasix for now and give gentle IV fluid given AKI   AKI on CKD stage IIIb-improving -Likely ATN due to vancomycin toxicity -Renal ultrasound with no acute findings -Hold further IV fluid for now -Repeat a.m. labs   Normocytic anemia -Continue to monitor   Hypertension -Continue home medications and monitor   Type 2 diabetes with hypoglycemia -A1c 13.6% on 6/9 -Discontinue Actos -Decreased Semglee---  now discontinued     Deconditioning/debility Nonambulatory at this point needing total care   Ethics: Patient is Now DNR/DNI, family agreed to pursue with comfort care and' \\Hospice'$  Palliative care team has been following closely with the patient and  patient's family       Consultants: Pulmonary PCCM, palliative care team Procedures performed: None Disposition: Hospice care Diet recommendation:  Discharge Diet Orders (From admission, onward)     Start     Ordered   04/20/22 0000  Diet - low sodium heart healthy        04/20/22 1142           Regular diet DISCHARGE MEDICATION: Allergies as of 04/21/2022       Reactions   Ace Inhibitors Other (See Comments)   Hyperkalemia--07/23/2013:patient states not familiar with the following allergy        Medication List     STOP taking these medications    albuterol (2.5 MG/3ML) 0.083% nebulizer solution Commonly known as: PROVENTIL   apixaban 2.5 MG Tabs tablet Commonly known as: Eliquis   cloNIDine 0.1 MG tablet Commonly known as: CATAPRES   fluticasone-salmeterol 500-50 MCG/ACT Aepb Commonly known as: Wixela Inhub   furosemide 40 MG tablet Commonly known as: LASIX   Lantus SoloStar 100 UNIT/ML Solostar Pen Generic drug: insulin glargine   metoprolol tartrate 25 MG tablet Commonly known as: LOPRESSOR   OneTouch Delica Plus [id number] Misc   OneTouch Ultra test strip Generic drug: glucose blood   OXYGEN   pioglitazone 30 MG tablet Commonly known as: Actos   potassium chloride SA 20 MEQ tablet Commonly known as: KLOR-CON M   pravastatin 80 MG tablet Commonly known as: PRAVACHOL   pregabalin 100 MG capsule Commonly known as: LYRICA   Spiriva HandiHaler 18 MCG inhalation capsule Generic drug: tiotropium       TAKE these medications    acetaminophen 325 MG tablet Commonly known as: TYLENOL Take 2 tablets (650 mg total) by mouth every 6 (six) hours as needed for mild pain (or Fever >/= 101).   glycopyrrolate 1 MG tablet Commonly known as: ROBINUL Take 1 tablet (1 mg total) by mouth every 4 (four) hours as needed (excessive secretions).   glycopyrrolate 0.2 MG/ML injection Commonly known as: ROBINUL Inject 1 mL (0.2 mg total) into the  skin every 4 (four) hours as needed (excessive secretions).   metoprolol succinate 50 MG 24 hr tablet Commonly known as: TOPROL-XL Take 1 tablet (50 mg total) by mouth daily. Take with or immediately following a meal.   ondansetron 4 MG tablet Commonly known as: ZOFRAN Take 1 tablet (4 mg total) by mouth every 6 (six) hours as needed for nausea.        Discharge Exam: Filed Weights   04/14/22 1440 04/19/22 0445 04/20/22 0500  Weight: 72 kg 74.3 kg 72.2 kg         Physical Exam:   General:  AAO x 1, confused, agitated cooperative, no distress;   HEENT:  Normocephalic, PERRL, otherwise with in Normal limits   Neuro:  CNII-XII intact. , normal motor and sensation, reflexes intact   Lungs:   Clear to auscultation BL, Respirations unlabored,  No wheezes / crackles  Cardio:    S1/S2, RRR, No murmure, No Rubs or Gallops   Abdomen:  Soft, non-tender, bowel sounds active all four quadrants, no guarding or peritoneal signs.  Muscular  skeletal:  Limited exam -global generalized weaknesses - in bed, able to move all 4 extremities,  2+ pulses,  symmetric, No pitting edema  Skin:  Dry, warm to touch, negative for any Rashes,  Wounds: Please see nursing documentation         Condition at discharge: poor  The results of significant diagnostics from this hospitalization (including imaging, microbiology, ancillary and laboratory) are listed below for reference.   Imaging Studies: DG CHEST PORT 1 VIEW  Result Date: 04/17/2022 CLINICAL DATA:  Increasing shortness of breath and productive cough in a patient with history of diabetes a pulmonary fibrosis EXAM: PORTABLE CHEST 1 VIEW COMPARISON:  April 14, 2022. FINDINGS: EKG leads project over the chest. Cardiomediastinal contours and hilar structures are stable with persistent cardiac enlargement accentuated by AP projection and portable technique. Increasing density within LEFT upper lobe airspace disease. No new area of consolidation  with scattered linear areas of atelectasis in the chest. No pneumothorax. On limited assessment no acute skeletal findings. IMPRESSION: Increasing density within the LEFT upper lobe airspace disease. Findings remain compatible with pneumonia at this time but would suggest follow-up to ensure resolution. Increasing lead defined or dense appearance could reflect evolution of the infectious process, not currently as diffuse as on the prior study. Electronically Signed   By: Zetta Bills M.D.   On: 04/17/2022 14:56   MR ANGIO HEAD WO CONTRAST  Result Date: 04/17/2022 CLINICAL DATA:  Neuro deficit, acute, stroke suspected. Encephalopathy and generalized weakness. EXAM: MRI HEAD WITHOUT CONTRAST MRA HEAD WITHOUT CONTRAST MRA NECK WITHOUT CONTRAST TECHNIQUE: Multiplanar, multiecho pulse sequences of the brain and surrounding structures were obtained without intravenous contrast. Angiographic images of the Circle of Willis were obtained using MRA technique without intravenous contrast. Angiographic images of the neck were obtained using MRA technique without intravenous contrast. Carotid stenosis measurements (when applicable) are obtained utilizing NASCET criteria, using the distal internal carotid diameter as the denominator. COMPARISON:  Head CT 04/17/2022.  Head MRI and MRA 09/26/2017. FINDINGS: MRI HEAD FINDINGS Multiple sequences are mildly to moderately motion degraded. Brain: There is no evidence of an acute infarct, intracranial hemorrhage, mass, midline shift, or extra-axial fluid collection. T2 hyperintensities in the cerebral white matter bilaterally are similar to the prior MRI and are nonspecific but compatible with mild chronic small vessel ischemic disease. There is moderate cerebral atrophy. Vascular: Major intracranial vascular flow voids are preserved. Skull and upper cervical spine: Unremarkable bone marrow signal. Sinuses/Orbits: Bilateral cataract extraction. Clear paranasal sinuses. Small left  mastoid effusion. Other: None. MRA HEAD FINDINGS The included portions of the intracranial vertebral arteries are widely patent to the basilar. Patent PICA and SCA origins are visualized bilaterally. The basilar artery is widely patent. Posterior communicating arteries are diminutive or absent. Both PCAs are patent with multifocal severe right P2 stenoses which are new from the 2018 MRA. There is up to mild left P2 stenosis. The internal carotid arteries are patent from skull base to carotid termini without definite evidence of a high-grade stenosis, although motion artifact limits assessment. ACAs and MCAs are patent without evidence of a proximal branch occlusion or high-grade proximal stenosis. No aneurysm is identified. MRA NECK FINDINGS Evaluation is limited by extensive motion artifact and noncontrast technique. There is nondiagnostic assessment of the aortic arch, brachiocephalic and subclavian arteries, and proximal common carotid and vertebral arteries. Flow is present in the mid and distal common carotid arteries, cervical internal carotid arteries, and distal V1 through V4 segments of the vertebral arteries, however motion artifact precludes assessment for stenosis. IMPRESSION: 1. No acute intracranial abnormality. 2. Mild chronic small  vessel ischemic disease and moderate cerebral atrophy. 3. Severely limited neck MRA due to motion artifact. Grossly patent cervical carotid and vertebral arteries. 4. No major intracranial arterial occlusion. Severe right P2 stenoses. Electronically Signed   By: Logan Bores M.D.   On: 04/17/2022 13:13   MR ANGIO NECK WO CONTRAST  Result Date: 04/17/2022 CLINICAL DATA:  Neuro deficit, acute, stroke suspected. Encephalopathy and generalized weakness. EXAM: MRI HEAD WITHOUT CONTRAST MRA HEAD WITHOUT CONTRAST MRA NECK WITHOUT CONTRAST TECHNIQUE: Multiplanar, multiecho pulse sequences of the brain and surrounding structures were obtained without intravenous contrast.  Angiographic images of the Circle of Willis were obtained using MRA technique without intravenous contrast. Angiographic images of the neck were obtained using MRA technique without intravenous contrast. Carotid stenosis measurements (when applicable) are obtained utilizing NASCET criteria, using the distal internal carotid diameter as the denominator. COMPARISON:  Head CT 04/17/2022.  Head MRI and MRA 09/26/2017. FINDINGS: MRI HEAD FINDINGS Multiple sequences are mildly to moderately motion degraded. Brain: There is no evidence of an acute infarct, intracranial hemorrhage, mass, midline shift, or extra-axial fluid collection. T2 hyperintensities in the cerebral white matter bilaterally are similar to the prior MRI and are nonspecific but compatible with mild chronic small vessel ischemic disease. There is moderate cerebral atrophy. Vascular: Major intracranial vascular flow voids are preserved. Skull and upper cervical spine: Unremarkable bone marrow signal. Sinuses/Orbits: Bilateral cataract extraction. Clear paranasal sinuses. Small left mastoid effusion. Other: None. MRA HEAD FINDINGS The included portions of the intracranial vertebral arteries are widely patent to the basilar. Patent PICA and SCA origins are visualized bilaterally. The basilar artery is widely patent. Posterior communicating arteries are diminutive or absent. Both PCAs are patent with multifocal severe right P2 stenoses which are new from the 2018 MRA. There is up to mild left P2 stenosis. The internal carotid arteries are patent from skull base to carotid termini without definite evidence of a high-grade stenosis, although motion artifact limits assessment. ACAs and MCAs are patent without evidence of a proximal branch occlusion or high-grade proximal stenosis. No aneurysm is identified. MRA NECK FINDINGS Evaluation is limited by extensive motion artifact and noncontrast technique. There is nondiagnostic assessment of the aortic arch,  brachiocephalic and subclavian arteries, and proximal common carotid and vertebral arteries. Flow is present in the mid and distal common carotid arteries, cervical internal carotid arteries, and distal V1 through V4 segments of the vertebral arteries, however motion artifact precludes assessment for stenosis. IMPRESSION: 1. No acute intracranial abnormality. 2. Mild chronic small vessel ischemic disease and moderate cerebral atrophy. 3. Severely limited neck MRA due to motion artifact. Grossly patent cervical carotid and vertebral arteries. 4. No major intracranial arterial occlusion. Severe right P2 stenoses. Electronically Signed   By: Logan Bores M.D.   On: 04/17/2022 13:13   MR BRAIN WO CONTRAST  Result Date: 04/17/2022 CLINICAL DATA:  Neuro deficit, acute, stroke suspected. Encephalopathy and generalized weakness. EXAM: MRI HEAD WITHOUT CONTRAST MRA HEAD WITHOUT CONTRAST MRA NECK WITHOUT CONTRAST TECHNIQUE: Multiplanar, multiecho pulse sequences of the brain and surrounding structures were obtained without intravenous contrast. Angiographic images of the Circle of Willis were obtained using MRA technique without intravenous contrast. Angiographic images of the neck were obtained using MRA technique without intravenous contrast. Carotid stenosis measurements (when applicable) are obtained utilizing NASCET criteria, using the distal internal carotid diameter as the denominator. COMPARISON:  Head CT 04/17/2022.  Head MRI and MRA 09/26/2017. FINDINGS: MRI HEAD FINDINGS Multiple sequences are mildly to moderately motion degraded. Brain:  There is no evidence of an acute infarct, intracranial hemorrhage, mass, midline shift, or extra-axial fluid collection. T2 hyperintensities in the cerebral white matter bilaterally are similar to the prior MRI and are nonspecific but compatible with mild chronic small vessel ischemic disease. There is moderate cerebral atrophy. Vascular: Major intracranial vascular flow voids  are preserved. Skull and upper cervical spine: Unremarkable bone marrow signal. Sinuses/Orbits: Bilateral cataract extraction. Clear paranasal sinuses. Small left mastoid effusion. Other: None. MRA HEAD FINDINGS The included portions of the intracranial vertebral arteries are widely patent to the basilar. Patent PICA and SCA origins are visualized bilaterally. The basilar artery is widely patent. Posterior communicating arteries are diminutive or absent. Both PCAs are patent with multifocal severe right P2 stenoses which are new from the 2018 MRA. There is up to mild left P2 stenosis. The internal carotid arteries are patent from skull base to carotid termini without definite evidence of a high-grade stenosis, although motion artifact limits assessment. ACAs and MCAs are patent without evidence of a proximal branch occlusion or high-grade proximal stenosis. No aneurysm is identified. MRA NECK FINDINGS Evaluation is limited by extensive motion artifact and noncontrast technique. There is nondiagnostic assessment of the aortic arch, brachiocephalic and subclavian arteries, and proximal common carotid and vertebral arteries. Flow is present in the mid and distal common carotid arteries, cervical internal carotid arteries, and distal V1 through V4 segments of the vertebral arteries, however motion artifact precludes assessment for stenosis. IMPRESSION: 1. No acute intracranial abnormality. 2. Mild chronic small vessel ischemic disease and moderate cerebral atrophy. 3. Severely limited neck MRA due to motion artifact. Grossly patent cervical carotid and vertebral arteries. 4. No major intracranial arterial occlusion. Severe right P2 stenoses. Electronically Signed   By: Logan Bores M.D.   On: 04/17/2022 13:13   CT HEAD CODE STROKE WO CONTRAST  Result Date: 04/17/2022 CLINICAL DATA:  Code stroke.  Neuro deficit, acute, stroke suspected EXAM: CT HEAD WITHOUT CONTRAST TECHNIQUE: Contiguous axial images were obtained  from the base of the skull through the vertex without intravenous contrast. RADIATION DOSE REDUCTION: This exam was performed according to the departmental dose-optimization program which includes automated exposure control, adjustment of the mA and/or kV according to patient size and/or use of iterative reconstruction technique. COMPARISON:  CT head July 07, 2021. FINDINGS: Brain: No evidence of acute large vascular territory infarction, hemorrhage, hydrocephalus, extra-axial collection or mass lesion/mass effect. Similar chronic microvascular disease and cerebral atrophy. Vascular: No hyperdense vessel identified. Skull: No acute fracture. Sinuses/Orbits: Left maxillary sinus mucosal thinking. No acute orbital findings Other: None. ASPECTS [facility] Stroke Program Early CT Score) total score (0-10 with 10 being normal): 10. IMPRESSION: 1. No evidence of acute intracranial abnormality. 2. ASPECTS is 10. Code stroke imaging results were communicated on 04/17/2022 at 9:35 am to provider Apolonio Schneiders via telephone, who verbally acknowledged these results. Electronically Signed   By: Margaretha Sheffield M.D.   On: 04/17/2022 09:37   US RENAL  Result Date: 04/16/2022 CLINICAL DATA:  Acute renal insufficiency EXAM: RENAL / URINARY TRACT ULTRASOUND COMPLETE COMPARISON:  None Available. FINDINGS: Right Kidney: Renal measurements: 9.3 x 5.6 x 4.8 cm = volume: 131 mL. Increased cortical echogenicity. Left Kidney: Renal measurements: 9.6 x 4.7 x 4.4 cm = volume: 104.4 mL. Increased cortical echogenicity. Bladder: Appears normal for degree of bladder distention. Other: None. IMPRESSION: Medical renal disease with increased cortical echogenicity. No acute abnormalities. Electronically Signed   By: Dorise Bullion III M.D.   On: 04/16/2022 12:04  CT Chest W Contrast  Result Date: 04/14/2022 CLINICAL DATA:  Shortness of breath for 2 weeks. Abnormal chest x-ray with persistent left lung opacities. Evaluate for soft tissue mass.  EXAM: CT CHEST WITH CONTRAST TECHNIQUE: Multidetector CT imaging of the chest was performed during intravenous contrast administration. RADIATION DOSE REDUCTION: This exam was performed according to the departmental dose-optimization program which includes automated exposure control, adjustment of the mA and/or kV according to patient size and/or use of iterative reconstruction technique. CONTRAST:  37m OMNIPAQUE IOHEXOL 300 MG/ML  SOLN COMPARISON:  Multiple radiographs, most recently 04/14/2022 and 03/20/2022. CT 02/28/2017. FINDINGS: Cardiovascular: Diffuse atherosclerosis of the aorta, great vessels and coronary arteries. No acute vascular findings are evident. There are calcifications of the aortic valve. The heart size is normal. There is no pericardial effusion. Mediastinum/Nodes: There are no enlarged mediastinal, hilar or axillary lymph nodes.Mildly prominent left infrahilar lymph nodes, likely reactive. The thyroid gland, trachea and esophagus demonstrate no significant findings. Lungs/Pleura: Trace left pleural effusion. No right pleural effusion or pneumothorax. Underlying moderate centrilobular and paraseptal emphysema. On the remote prior chest CT, there were extensive right upper and bilateral lower lobe infiltrates which have resolved. Mild residual right lung scarring is noted. There is focal airspace disease peripherally and anteriorly in the left upper lobe, as seen on recent radiographs. These opacities were first seen 03/10/2022 and are new from 02/07/2022, and therefore consistent with pneumonia. In review of prior radiographs, there appears to be mild improvement from 03/10/2022. There is some associated central airway thickening and luminal narrowing, and as above, probable reactive left hilar adenopathy. No focal lung mass identified to suggest malignancy. Upper abdomen: The visualized upper abdomen appears stable without acute findings. There is fatty replacement of the pancreas and aortic  atherosclerosis. Musculoskeletal/Chest wall: There is no chest wall mass or suspicious osseous finding. Bilateral gynecomastia, similar to previous study. Multilevel spondylosis. IMPRESSION: 1. Persistent left upper lobe airspace disease, new from radiographs of 02/07/2022, but incompletely resolved. Findings remain most consistent with pneumonia. Continued radiographic follow-up in 4-6 weeks recommended. 2. No focal lung mass identified to suggest malignancy. There is left hilar adenopathy which is nonspecific, although likely reactive. Mild associated central airway thickening and luminal narrowing. 3. Coronary and aortic atherosclerosis (ICD10-I70.0). Emphysema (ICD10-J43.9). Electronically Signed   By: WRichardean SaleM.D.   On: 04/14/2022 09:24   DG Chest 2 View  Result Date: 04/14/2022 CLINICAL DATA:  SOB EXAM: CHEST - 2 VIEW COMPARISON:  Chest x-ray June 19, 23. FINDINGS: Slightly progressed interstitial and airspace opacities in the left mid and upper lung. No visible pleural effusions pneumothorax. Similar cardiomediastinal silhouette. No acute osseous abnormality. IMPRESSION: Slightly progressed interstitial and airspace opacities in the left mid and upper lung, concerning for pneumonia. Given persistence, recommend CT chest (preferably with contrast) to further evaluate and exclude alternative etiologies such as malignancy. Electronically Signed   By: FMargaretha SheffieldM.D.   On: 04/14/2022 08:31    Microbiology: Results for orders placed or performed during the hospital encounter of 04/14/22  Culture, blood (Routine X 2) w Reflex to ID Panel     Status: None   Collection Time: 04/16/22  7:17 AM   Specimen: BLOOD LEFT HAND  Result Value Ref Range Status   Specimen Description   Final    BLOOD LEFT HAND BOTTLES DRAWN AEROBIC AND ANAEROBIC   Special Requests Blood Culture adequate volume  Final   Culture   Final    NO GROWTH 5 DAYS Performed at  [facility], [address] [place],  [place] [address]    Report Status 04/21/2022 FINAL  Final  Culture, blood (Routine X 2) w Reflex to ID Panel     Status: None   Collection Time: 04/16/22  7:17 AM   Specimen: BLOOD RIGHT HAND  Result Value Ref Range Status   Specimen Description BLOOD RIGHT HAND BOTTLES DRAWN AEROBIC ONLY  Final   Special Requests Blood Culture adequate volume  Final   Culture   Final    NO GROWTH 5 DAYS Performed at [facility], [address], [place], [place] [address]    Report Status 04/21/2022 FINAL  Final  MRSA Next Gen by PCR, Nasal     Status: Abnormal   Collection Time: 04/19/22 11:24 AM   Specimen: Nasal Mucosa; Nasal Swab  Result Value Ref Range Status   MRSA by PCR Next Gen DETECTED (A) NOT DETECTED Final    Comment: RESULT CALLED TO, READ BACK BY AND VERIFIED WITH: JEFF STRENK @ 1409 ON 04/19/22 C VARNER (NOTE) The GeneXpert MRSA Assay (FDA approved for NASAL specimens only), is one component of a comprehensive MRSA colonization surveillance program. It is not intended to diagnose MRSA infection nor to guide or monitor treatment for MRSA infections. Test performance is not FDA approved in patients less than 63 years old. Performed at [facility], [address] [place], [place] [address]     Labs: CBC: Recent Labs  Lab 04/15/22 [phone] 04/16/22 0518 04/17/22 0435 04/18/22 0511 04/20/22 0403  WBC 9.4 13.1* 11.8* 9.1 9.7  HGB 10.3* 9.4* 10.0* 10.9* 11.3*  HCT 32.4* 30.0* 31.7* 34.1* 35.5*  MCV 87.8 88.5 88.8 87.2 86.2  PLT 274 254 267 316 964   Basic Metabolic Panel: Recent Labs  Lab 04/15/22 0653 04/16/22 0518 04/17/22 0435 04/18/22 0511 04/20/22 0403  NA 135 132* 134* 138 147*  K 4.6 4.6 4.8 4.5 4.4  CL 103 103 106 109 121*  CO2 25 20* 18* 19* 16*  GLUCOSE 187* 192* 147* 49* 112*  BUN 49* 72* 89* 98* 80*  CREATININE 1.46* 2.16* 2.65* 2.49* 1.67*  CALCIUM 8.3* 8.0* 7.8* 8.0* 8.5*  MG 2.7* 2.7* 2.8* 3.0*  --    Liver Function Tests: Recent Labs  Lab 04/17/22 0920  04/18/22 0511 04/20/22 0403  AST 11* 12* 16  ALT '11 11 13  '$ ALKPHOS 55 52 59  BILITOT 0.4 0.5 0.6  PROT 6.4* 6.4* 6.4*  ALBUMIN 2.8* 2.7* 2.9*   CBG: Recent Labs  Lab 04/19/22 1152 04/19/22 1553 04/19/22 2105 04/20/22 0731 04/20/22 1128  GLUCAP 70 72 83 147* 186*    Discharge time spent: greater than 30 minutes.  Signed: Deatra James, MD [facility] 04/21/2022

## 2022-04-21 NOTE — Progress Notes (Signed)
Report called and given to [facility], RN on Dept. 300. Pt to be transported via bed to room 323.

## 2022-04-21 NOTE — TOC Progression Note (Signed)
Transition of Care [facility]) - Progression Note    Patient Details  Name: CONNOR MEACHAM MRN: [id number] Date of Birth: 1935-02-05  [facility]) CM/SW Contact  Ihor Gully, LCSW Phone Number: 04/21/2022, 12:59 PM  Clinical Narrative:    [facility] has made multiple attempts to contact patient's son and spouse to no avail per their report. Hospice cannot open a virtual bed until they have spoken with the family. Hospice does not do virtual beds on the weekend.  Hospice will attempt to follow up with patient's family on Monday.    Expected Discharge Plan: [place] Barriers to Discharge: Continued Medical Work up  Expected Discharge Plan and Services Expected Discharge Plan: [place]         Expected Discharge Date: 04/20/22                                     Social Determinants of Health (SDOH) Interventions    Readmission Risk Interventions    04/20/2020    2:57 PM 04/19/2020    4:27 PM 04/19/2020   11:18 AM  Readmission Risk Prevention Plan  Transportation Screening   Complete  Medication Review (RN Care Manager)   Complete  PCP or Specialist appointment within 3-5 days of discharge Not Complete    PCP/Specialist Appt Not Complete comments appointment is scheduled for 04/26/20 which is six days. Labs to are to be drawn every Monday per attending request.    [place] or [place]  Complete   SW Recovery Care/Counseling Consult   Complete  Palliative Care Screening   Not Applicable

## 2022-04-22 MED ORDER — METHYLPREDNISOLONE SODIUM SUCC 40 MG IJ SOLR
20.0000 mg | Freq: Every day | INTRAMUSCULAR | Status: DC
  Administered 2022-04-23 – 2022-04-24 (×2): 20 mg via INTRAVENOUS
  Filled 2022-04-22 (×2): qty 1

## 2022-04-22 MED ORDER — LORAZEPAM 2 MG/ML IJ SOLN
1.0000 mg | Freq: Four times a day (QID) | INTRAMUSCULAR | Status: DC | PRN
  Administered 2022-04-22 – 2022-04-24 (×2): 1 mg via INTRAVENOUS
  Filled 2022-04-22 (×2): qty 1

## 2022-04-22 NOTE — Progress Notes (Signed)
Physician Discharge Summary   Patient: Edward [name] MRN: [id number] DOB: Apr 01, 1935  Admit date:     04/14/2022  Discharge date: 04/22/22  Discharge Physician: Deatra James   PCP: Susy Frizzle, MD     The patient was seen and examined, awake alert x1, less agitated, hemodynamically stable   Disposition: pending discharge to hospice -awaiting bed availability    Recommendations at discharge:  Follow-up with hospice -[facility] hospice house  Discharge Diagnoses: Principal Problem:   COPD with acute exacerbation ([place]) Active Problems:   Hypertension   DM type 2 causing vascular disease ([place])   Chronic diastolic CHF (congestive heart failure) ([name])   HCAP (healthcare-associated pneumonia)   AF (paroxysmal atrial fibrillation) ([name])   Hyponatremia  Resolved Problems:   * No resolved hospital problems. [facility] Course: Edward [name] is a 87 y.o. male with medical history significant for COPD and chronic hypoxemic respiratory failure on 3 L nasal cannula who was recently discharged on 6/13 after treatment for acute COPD exacerbation and metabolic encephalopathy in the setting of pneumonia.  He was discharged with a regimen of oral antibiotics and steroid taper, but it does not appear that he actually picked up these medications.  He states that he has had some persistent dyspnea that has been worsening along with a nonproductive cough for the last 2-3 weeks.  He was readmitted with acute COPD exacerbation in the setting of persistent pneumonia.  He is also now noted to have worsening AKI while being treated with vancomycin.  He was noted to have 1 blood culture positive with MRSA.  On 7/17 he was noted to be more confused and lethargic and have a code stroke called, but there was no acute CVA noted.  He was noted to have some worsening encephalopathy.  He has continued to have worsening of his overall condition which necessitated transfer to stepdown unit for closer  monitoring.  Palliative care has had further discussion with family who are now in agreement for DNR.  He appears to have very poor prognosis.      Acute respiratory failure due to acute COPD exacerbation with concern for persistent pneumonia Discontinued IV steroids, antitussives, nebs, During hospitalization: -was on IV Solu-Medrol twice daily  -DuoNebs -Antitussives -Vancomycin and cefepime empirically for HCAP   -Currently on baseline 3 L nasal cannula oxygen satting 97% -Consult to palliative care per PCCM recommendations; patient overall with very poor prognosis  Family has agreed to progress to comfort care, Hospice    Acute metabolic encephalopathy -Waxing and waning Minimal verbal conversation, noncooperative, --PCO2 and ammonia within normal limits -Code stroke called 7/17 with head CT and brain MRI with no acute findings -Likely due to Lyrica use in the setting of AKI and this has not been discontinued -Continue to monitor closely for improvement   MRSA bacteremia-unlikely -Repeat blood cultures negative -Vancomycin held for now given AKI   History of paroxysmal atrial fibrillation -Continue Eliquis at home and metoprolol for heart rate control   Chronic diastolic CHF -2D echocardiogram 1 month ago with EF 60-65% and grade 2 diastolic dysfunction -Hold Lasix for now and give gentle IV fluid given AKI   AKI on CKD stage IIIb-improving -Likely ATN due to vancomycin toxicity -Renal ultrasound with no acute findings -Hold further IV fluid for now -Repeat a.m. labs   Normocytic anemia -Continue to monitor   Hypertension -Continue home medications and monitor   Type 2 diabetes with hypoglycemia -A1c 13.6% on 6/9 -Discontinue  Actos -Decreased Semglee--- now discontinued     Deconditioning/debility Nonambulatory at this point needing total care   Ethics: Patient is Now DNR/DNI, family agreed to pursue with comfort care and' \\Hospice'$  Palliative care team  has been following closely with the patient and patient's family       Consultants: Pulmonary PCCM, palliative care team Procedures performed: None Disposition: Hospice care Diet recommendation:  Discharge Diet Orders (From admission, onward)     Start     Ordered   04/20/22 0000  Diet - low sodium heart healthy        04/20/22 1142           Regular diet DISCHARGE MEDICATION: Allergies as of 04/22/2022       Reactions   Ace Inhibitors Other (See Comments)   Hyperkalemia--07/23/2013:patient states not familiar with the following allergy        Medication List     STOP taking these medications    albuterol (2.5 MG/3ML) 0.083% nebulizer solution Commonly known as: PROVENTIL   apixaban 2.5 MG Tabs tablet Commonly known as: Eliquis   cloNIDine 0.1 MG tablet Commonly known as: CATAPRES   fluticasone-salmeterol 500-50 MCG/ACT Aepb Commonly known as: Wixela Inhub   furosemide 40 MG tablet Commonly known as: LASIX   Lantus SoloStar 100 UNIT/ML Solostar Pen Generic drug: insulin glargine   metoprolol tartrate 25 MG tablet Commonly known as: LOPRESSOR   OneTouch Delica Plus [id number] Misc   OneTouch Ultra test strip Generic drug: glucose blood   OXYGEN   pioglitazone 30 MG tablet Commonly known as: Actos   potassium chloride SA 20 MEQ tablet Commonly known as: KLOR-CON M   pravastatin 80 MG tablet Commonly known as: PRAVACHOL   pregabalin 100 MG capsule Commonly known as: LYRICA   Spiriva HandiHaler 18 MCG inhalation capsule Generic drug: tiotropium       TAKE these medications    acetaminophen 325 MG tablet Commonly known as: TYLENOL Take 2 tablets (650 mg total) by mouth every 6 (six) hours as needed for mild pain (or Fever >/= 101).   glycopyrrolate 1 MG tablet Commonly known as: ROBINUL Take 1 tablet (1 mg total) by mouth every 4 (four) hours as needed (excessive secretions).   glycopyrrolate 0.2 MG/ML injection Commonly known as:  ROBINUL Inject 1 mL (0.2 mg total) into the skin every 4 (four) hours as needed (excessive secretions).   metoprolol succinate 50 MG 24 hr tablet Commonly known as: TOPROL-XL Take 1 tablet (50 mg total) by mouth daily. Take with or immediately following a meal.   ondansetron 4 MG tablet Commonly known as: ZOFRAN Take 1 tablet (4 mg total) by mouth every 6 (six) hours as needed for nausea.        Discharge Exam: Filed Weights   04/14/22 1440 04/19/22 0445 04/20/22 0500  Weight: 72 kg 74.3 kg 72.2 kg          Physical Exam:   General:  AAO x 1, pleasantly confused, less agitated cooperative, no distress;   HEENT:  Normocephalic, PERRL, otherwise with in Normal limits   Neuro:  CNII-XII intact. , normal motor and sensation, reflexes intact   Lungs:   Clear to auscultation BL, Respirations unlabored,  No wheezes / crackles  Cardio:    S1/S2, RRR, No murmure, No Rubs or Gallops   Abdomen:  Soft, non-tender, bowel sounds active all four quadrants, no guarding or peritoneal signs.  Muscular  skeletal:  Limited exam - sever global generalized weaknesses -  in bed, able to move all 4 extremities,   2+ pulses,  symmetric, No pitting edema  Skin:  Dry, warm to touch, negative for any Rashes,  Wounds: Please see nursing documentation            Condition at discharge: poor  The results of significant diagnostics from this hospitalization (including imaging, microbiology, ancillary and laboratory) are listed below for reference.   Imaging Studies: DG CHEST PORT 1 VIEW  Result Date: 04/17/2022 CLINICAL DATA:  Increasing shortness of breath and productive cough in a patient with history of diabetes a pulmonary fibrosis EXAM: PORTABLE CHEST 1 VIEW COMPARISON:  April 14, 2022. FINDINGS: EKG leads project over the chest. Cardiomediastinal contours and hilar structures are stable with persistent cardiac enlargement accentuated by AP projection and portable technique. Increasing  density within LEFT upper lobe airspace disease. No new area of consolidation with scattered linear areas of atelectasis in the chest. No pneumothorax. On limited assessment no acute skeletal findings. IMPRESSION: Increasing density within the LEFT upper lobe airspace disease. Findings remain compatible with pneumonia at this time but would suggest follow-up to ensure resolution. Increasing lead defined or dense appearance could reflect evolution of the infectious process, not currently as diffuse as on the prior study. Electronically Signed   By: Zetta Bills M.D.   On: 04/17/2022 14:56   MR ANGIO HEAD WO CONTRAST  Result Date: 04/17/2022 CLINICAL DATA:  Neuro deficit, acute, stroke suspected. Encephalopathy and generalized weakness. EXAM: MRI HEAD WITHOUT CONTRAST MRA HEAD WITHOUT CONTRAST MRA NECK WITHOUT CONTRAST TECHNIQUE: Multiplanar, multiecho pulse sequences of the brain and surrounding structures were obtained without intravenous contrast. Angiographic images of the Circle of Willis were obtained using MRA technique without intravenous contrast. Angiographic images of the neck were obtained using MRA technique without intravenous contrast. Carotid stenosis measurements (when applicable) are obtained utilizing NASCET criteria, using the distal internal carotid diameter as the denominator. COMPARISON:  Head CT 04/17/2022.  Head MRI and MRA 09/26/2017. FINDINGS: MRI HEAD FINDINGS Multiple sequences are mildly to moderately motion degraded. Brain: There is no evidence of an acute infarct, intracranial hemorrhage, mass, midline shift, or extra-axial fluid collection. T2 hyperintensities in the cerebral white matter bilaterally are similar to the prior MRI and are nonspecific but compatible with mild chronic small vessel ischemic disease. There is moderate cerebral atrophy. Vascular: Major intracranial vascular flow voids are preserved. Skull and upper cervical spine: Unremarkable bone marrow signal.  Sinuses/Orbits: Bilateral cataract extraction. Clear paranasal sinuses. Small left mastoid effusion. Other: None. MRA HEAD FINDINGS The included portions of the intracranial vertebral arteries are widely patent to the basilar. Patent PICA and SCA origins are visualized bilaterally. The basilar artery is widely patent. Posterior communicating arteries are diminutive or absent. Both PCAs are patent with multifocal severe right P2 stenoses which are new from the 2018 MRA. There is up to mild left P2 stenosis. The internal carotid arteries are patent from skull base to carotid termini without definite evidence of a high-grade stenosis, although motion artifact limits assessment. ACAs and MCAs are patent without evidence of a proximal branch occlusion or high-grade proximal stenosis. No aneurysm is identified. MRA NECK FINDINGS Evaluation is limited by extensive motion artifact and noncontrast technique. There is nondiagnostic assessment of the aortic arch, brachiocephalic and subclavian arteries, and proximal common carotid and vertebral arteries. Flow is present in the mid and distal common carotid arteries, cervical internal carotid arteries, and distal V1 through V4 segments of the vertebral arteries, however motion artifact precludes  assessment for stenosis. IMPRESSION: 1. No acute intracranial abnormality. 2. Mild chronic small vessel ischemic disease and moderate cerebral atrophy. 3. Severely limited neck MRA due to motion artifact. Grossly patent cervical carotid and vertebral arteries. 4. No major intracranial arterial occlusion. Severe right P2 stenoses. Electronically Signed   By: Logan Bores M.D.   On: 04/17/2022 13:13   MR ANGIO NECK WO CONTRAST  Result Date: 04/17/2022 CLINICAL DATA:  Neuro deficit, acute, stroke suspected. Encephalopathy and generalized weakness. EXAM: MRI HEAD WITHOUT CONTRAST MRA HEAD WITHOUT CONTRAST MRA NECK WITHOUT CONTRAST TECHNIQUE: Multiplanar, multiecho pulse sequences of the  brain and surrounding structures were obtained without intravenous contrast. Angiographic images of the Circle of Willis were obtained using MRA technique without intravenous contrast. Angiographic images of the neck were obtained using MRA technique without intravenous contrast. Carotid stenosis measurements (when applicable) are obtained utilizing NASCET criteria, using the distal internal carotid diameter as the denominator. COMPARISON:  Head CT 04/17/2022.  Head MRI and MRA 09/26/2017. FINDINGS: MRI HEAD FINDINGS Multiple sequences are mildly to moderately motion degraded. Brain: There is no evidence of an acute infarct, intracranial hemorrhage, mass, midline shift, or extra-axial fluid collection. T2 hyperintensities in the cerebral white matter bilaterally are similar to the prior MRI and are nonspecific but compatible with mild chronic small vessel ischemic disease. There is moderate cerebral atrophy. Vascular: Major intracranial vascular flow voids are preserved. Skull and upper cervical spine: Unremarkable bone marrow signal. Sinuses/Orbits: Bilateral cataract extraction. Clear paranasal sinuses. Small left mastoid effusion. Other: None. MRA HEAD FINDINGS The included portions of the intracranial vertebral arteries are widely patent to the basilar. Patent PICA and SCA origins are visualized bilaterally. The basilar artery is widely patent. Posterior communicating arteries are diminutive or absent. Both PCAs are patent with multifocal severe right P2 stenoses which are new from the 2018 MRA. There is up to mild left P2 stenosis. The internal carotid arteries are patent from skull base to carotid termini without definite evidence of a high-grade stenosis, although motion artifact limits assessment. ACAs and MCAs are patent without evidence of a proximal branch occlusion or high-grade proximal stenosis. No aneurysm is identified. MRA NECK FINDINGS Evaluation is limited by extensive motion artifact and  noncontrast technique. There is nondiagnostic assessment of the aortic arch, brachiocephalic and subclavian arteries, and proximal common carotid and vertebral arteries. Flow is present in the mid and distal common carotid arteries, cervical internal carotid arteries, and distal V1 through V4 segments of the vertebral arteries, however motion artifact precludes assessment for stenosis. IMPRESSION: 1. No acute intracranial abnormality. 2. Mild chronic small vessel ischemic disease and moderate cerebral atrophy. 3. Severely limited neck MRA due to motion artifact. Grossly patent cervical carotid and vertebral arteries. 4. No major intracranial arterial occlusion. Severe right P2 stenoses. Electronically Signed   By: Logan Bores M.D.   On: 04/17/2022 13:13   MR BRAIN WO CONTRAST  Result Date: 04/17/2022 CLINICAL DATA:  Neuro deficit, acute, stroke suspected. Encephalopathy and generalized weakness. EXAM: MRI HEAD WITHOUT CONTRAST MRA HEAD WITHOUT CONTRAST MRA NECK WITHOUT CONTRAST TECHNIQUE: Multiplanar, multiecho pulse sequences of the brain and surrounding structures were obtained without intravenous contrast. Angiographic images of the Circle of Willis were obtained using MRA technique without intravenous contrast. Angiographic images of the neck were obtained using MRA technique without intravenous contrast. Carotid stenosis measurements (when applicable) are obtained utilizing NASCET criteria, using the distal internal carotid diameter as the denominator. COMPARISON:  Head CT 04/17/2022.  Head MRI and MRA 09/26/2017.  FINDINGS: MRI HEAD FINDINGS Multiple sequences are mildly to moderately motion degraded. Brain: There is no evidence of an acute infarct, intracranial hemorrhage, mass, midline shift, or extra-axial fluid collection. T2 hyperintensities in the cerebral white matter bilaterally are similar to the prior MRI and are nonspecific but compatible with mild chronic small vessel ischemic disease. There is  moderate cerebral atrophy. Vascular: Major intracranial vascular flow voids are preserved. Skull and upper cervical spine: Unremarkable bone marrow signal. Sinuses/Orbits: Bilateral cataract extraction. Clear paranasal sinuses. Small left mastoid effusion. Other: None. MRA HEAD FINDINGS The included portions of the intracranial vertebral arteries are widely patent to the basilar. Patent PICA and SCA origins are visualized bilaterally. The basilar artery is widely patent. Posterior communicating arteries are diminutive or absent. Both PCAs are patent with multifocal severe right P2 stenoses which are new from the 2018 MRA. There is up to mild left P2 stenosis. The internal carotid arteries are patent from skull base to carotid termini without definite evidence of a high-grade stenosis, although motion artifact limits assessment. ACAs and MCAs are patent without evidence of a proximal branch occlusion or high-grade proximal stenosis. No aneurysm is identified. MRA NECK FINDINGS Evaluation is limited by extensive motion artifact and noncontrast technique. There is nondiagnostic assessment of the aortic arch, brachiocephalic and subclavian arteries, and proximal common carotid and vertebral arteries. Flow is present in the mid and distal common carotid arteries, cervical internal carotid arteries, and distal V1 through V4 segments of the vertebral arteries, however motion artifact precludes assessment for stenosis. IMPRESSION: 1. No acute intracranial abnormality. 2. Mild chronic small vessel ischemic disease and moderate cerebral atrophy. 3. Severely limited neck MRA due to motion artifact. Grossly patent cervical carotid and vertebral arteries. 4. No major intracranial arterial occlusion. Severe right P2 stenoses. Electronically Signed   By: Logan Bores M.D.   On: 04/17/2022 13:13   CT HEAD CODE STROKE WO CONTRAST  Result Date: 04/17/2022 CLINICAL DATA:  Code stroke.  Neuro deficit, acute, stroke suspected EXAM:  CT HEAD WITHOUT CONTRAST TECHNIQUE: Contiguous axial images were obtained from the base of the skull through the vertex without intravenous contrast. RADIATION DOSE REDUCTION: This exam was performed according to the departmental dose-optimization program which includes automated exposure control, adjustment of the mA and/or kV according to patient size and/or use of iterative reconstruction technique. COMPARISON:  CT head July 07, 2021. FINDINGS: Brain: No evidence of acute large vascular territory infarction, hemorrhage, hydrocephalus, extra-axial collection or mass lesion/mass effect. Similar chronic microvascular disease and cerebral atrophy. Vascular: No hyperdense vessel identified. Skull: No acute fracture. Sinuses/Orbits: Left maxillary sinus mucosal thinking. No acute orbital findings Other: None. ASPECTS [facility] Stroke Program Early CT Score) total score (0-10 with 10 being normal): 10. IMPRESSION: 1. No evidence of acute intracranial abnormality. 2. ASPECTS is 10. Code stroke imaging results were communicated on 04/17/2022 at 9:35 am to provider Apolonio Schneiders via telephone, who verbally acknowledged these results. Electronically Signed   By: Margaretha Sheffield M.D.   On: 04/17/2022 09:37   US RENAL  Result Date: 04/16/2022 CLINICAL DATA:  Acute renal insufficiency EXAM: RENAL / URINARY TRACT ULTRASOUND COMPLETE COMPARISON:  None Available. FINDINGS: Right Kidney: Renal measurements: 9.3 x 5.6 x 4.8 cm = volume: 131 mL. Increased cortical echogenicity. Left Kidney: Renal measurements: 9.6 x 4.7 x 4.4 cm = volume: 104.4 mL. Increased cortical echogenicity. Bladder: Appears normal for degree of bladder distention. Other: None. IMPRESSION: Medical renal disease with increased cortical echogenicity. No acute abnormalities. Electronically Signed  By: Dorise Bullion III M.D.   On: 04/16/2022 12:04   CT Chest W Contrast  Result Date: 04/14/2022 CLINICAL DATA:  Shortness of breath for 2 weeks. Abnormal chest  x-ray with persistent left lung opacities. Evaluate for soft tissue mass. EXAM: CT CHEST WITH CONTRAST TECHNIQUE: Multidetector CT imaging of the chest was performed during intravenous contrast administration. RADIATION DOSE REDUCTION: This exam was performed according to the departmental dose-optimization program which includes automated exposure control, adjustment of the mA and/or kV according to patient size and/or use of iterative reconstruction technique. CONTRAST:  31m OMNIPAQUE IOHEXOL 300 MG/ML  SOLN COMPARISON:  Multiple radiographs, most recently 04/14/2022 and 03/20/2022. CT 02/28/2017. FINDINGS: Cardiovascular: Diffuse atherosclerosis of the aorta, great vessels and coronary arteries. No acute vascular findings are evident. There are calcifications of the aortic valve. The heart size is normal. There is no pericardial effusion. Mediastinum/Nodes: There are no enlarged mediastinal, hilar or axillary lymph nodes.Mildly prominent left infrahilar lymph nodes, likely reactive. The thyroid gland, trachea and esophagus demonstrate no significant findings. Lungs/Pleura: Trace left pleural effusion. No right pleural effusion or pneumothorax. Underlying moderate centrilobular and paraseptal emphysema. On the remote prior chest CT, there were extensive right upper and bilateral lower lobe infiltrates which have resolved. Mild residual right lung scarring is noted. There is focal airspace disease peripherally and anteriorly in the left upper lobe, as seen on recent radiographs. These opacities were first seen 03/10/2022 and are new from 02/07/2022, and therefore consistent with pneumonia. In review of prior radiographs, there appears to be mild improvement from 03/10/2022. There is some associated central airway thickening and luminal narrowing, and as above, probable reactive left hilar adenopathy. No focal lung mass identified to suggest malignancy. Upper abdomen: The visualized upper abdomen appears stable  without acute findings. There is fatty replacement of the pancreas and aortic atherosclerosis. Musculoskeletal/Chest wall: There is no chest wall mass or suspicious osseous finding. Bilateral gynecomastia, similar to previous study. Multilevel spondylosis. IMPRESSION: 1. Persistent left upper lobe airspace disease, new from radiographs of 02/07/2022, but incompletely resolved. Findings remain most consistent with pneumonia. Continued radiographic follow-up in 4-6 weeks recommended. 2. No focal lung mass identified to suggest malignancy. There is left hilar adenopathy which is nonspecific, although likely reactive. Mild associated central airway thickening and luminal narrowing. 3. Coronary and aortic atherosclerosis (ICD10-I70.0). Emphysema (ICD10-J43.9). Electronically Signed   By: WRichardean SaleM.D.   On: 04/14/2022 09:24   DG Chest 2 View  Result Date: 04/14/2022 CLINICAL DATA:  SOB EXAM: CHEST - 2 VIEW COMPARISON:  Chest x-ray June 19, 23. FINDINGS: Slightly progressed interstitial and airspace opacities in the left mid and upper lung. No visible pleural effusions pneumothorax. Similar cardiomediastinal silhouette. No acute osseous abnormality. IMPRESSION: Slightly progressed interstitial and airspace opacities in the left mid and upper lung, concerning for pneumonia. Given persistence, recommend CT chest (preferably with contrast) to further evaluate and exclude alternative etiologies such as malignancy. Electronically Signed   By: FMargaretha SheffieldM.D.   On: 04/14/2022 08:31    Microbiology: Results for orders placed or performed during the hospital encounter of 04/14/22  Culture, blood (Routine X 2) w Reflex to ID Panel     Status: None   Collection Time: 04/16/22  7:17 AM   Specimen: BLOOD LEFT HAND  Result Value Ref Range Status   Specimen Description   Final    BLOOD LEFT HAND BOTTLES DRAWN AEROBIC AND ANAEROBIC   Special Requests Blood Culture adequate volume  Final  Culture   Final     NO GROWTH 5 DAYS Performed at [facility], [address], [place], [place] [address]    Report Status 04/21/2022 FINAL  Final  Culture, blood (Routine X 2) w Reflex to ID Panel     Status: None   Collection Time: 04/16/22  7:17 AM   Specimen: BLOOD RIGHT HAND  Result Value Ref Range Status   Specimen Description BLOOD RIGHT HAND BOTTLES DRAWN AEROBIC ONLY  Final   Special Requests Blood Culture adequate volume  Final   Culture   Final    NO GROWTH 5 DAYS Performed at [facility], [address], [place], [place] [address]    Report Status 04/21/2022 FINAL  Final  MRSA Next Gen by PCR, Nasal     Status: Abnormal   Collection Time: 04/19/22 11:24 AM   Specimen: Nasal Mucosa; Nasal Swab  Result Value Ref Range Status   MRSA by PCR Next Gen DETECTED (A) NOT DETECTED Final    Comment: RESULT CALLED TO, READ BACK BY AND VERIFIED WITH: JEFF STRENK @ 1409 ON 04/19/22 C VARNER (NOTE) The GeneXpert MRSA Assay (FDA approved for NASAL specimens only), is one component of a comprehensive MRSA colonization surveillance program. It is not intended to diagnose MRSA infection nor to guide or monitor treatment for MRSA infections. Test performance is not FDA approved in patients less than 33 years old. Performed at [facility], [address], [place], [place] [address]     Labs: CBC: Recent Labs  Lab 04/16/22 0518 04/17/22 0435 04/18/22 0511 04/20/22 0403  WBC 13.1* 11.8* 9.1 9.7  HGB 9.4* 10.0* 10.9* 11.3*  HCT 30.0* 31.7* 34.1* 35.5*  MCV 88.5 88.8 87.2 86.2  PLT 254 267 316 694   Basic Metabolic Panel: Recent Labs  Lab 04/16/22 0518 04/17/22 0435 04/18/22 0511 04/20/22 0403  NA 132* 134* 138 147*  K 4.6 4.8 4.5 4.4  CL 103 106 109 121*  CO2 20* 18* 19* 16*  GLUCOSE 192* 147* 49* 112*  BUN 72* 89* 98* 80*  CREATININE 2.16* 2.65* 2.49* 1.67*  CALCIUM 8.0* 7.8* 8.0* 8.5*  MG 2.7* 2.8* 3.0*  --    Liver Function Tests: Recent Labs  Lab 04/17/22 0920 04/18/22 0511  04/20/22 0403  AST 11* 12* 16  ALT '11 11 13  '$ ALKPHOS 55 52 59  BILITOT 0.4 0.5 0.6  PROT 6.4* 6.4* 6.4*  ALBUMIN 2.8* 2.7* 2.9*   CBG: Recent Labs  Lab 04/19/22 1152 04/19/22 1553 04/19/22 2105 04/20/22 0731 04/20/22 1128  GLUCAP 70 72 83 147* 186*    Discharge time spent: greater than 30 minutes.  Signed: Deatra James, MD [facility] 04/22/2022

## 2022-04-22 NOTE — Progress Notes (Signed)
Pts son updated son via phone

## 2022-04-23 MED ORDER — POLYVINYL ALCOHOL 1.4 % OP SOLN: 1.0000 [drp] | Freq: Four times a day (QID) | OPHTHALMIC | Status: DC | PRN

## 2022-04-23 MED ORDER — HALOPERIDOL LACTATE 2 MG/ML PO CONC: 0.5000 mg | ORAL | Status: DC | PRN

## 2022-04-23 MED ORDER — HALOPERIDOL LACTATE 5 MG/ML IJ SOLN: 0.5000 mg | INTRAMUSCULAR | Status: DC | PRN

## 2022-04-23 MED ORDER — FOOD THICKENER (SIMPLYTHICK)
1.0000 | ORAL | Status: DC | PRN
  Filled 2022-04-23: qty 1

## 2022-04-23 MED ORDER — HALOPERIDOL 0.5 MG PO TABS: 0.5000 mg | ORAL_TABLET | ORAL | Status: DC | PRN

## 2022-04-23 NOTE — Progress Notes (Signed)
Patient is not alert enough at this time to safely administer PO medications, patient repositioned and given IV medication . Eyes closed no s/s of pain or discomfort,.

## 2022-04-23 NOTE — Progress Notes (Addendum)
Physician Discharge Summary   Patient: Edward [name] MRN: [id number] DOB: [birth year]  Admit date:     04/14/2022  Discharge date: 04/23/22  Discharge Physician: Deatra James   PCP: Susy Frizzle, MD    The patient was seen and examined this morning, somnolent, overnight agitation, hemodynamically stable... Comfort care orders has been placed Ready to be discharged.  Disposition: pending discharge to Hospice -awaiting bed availability    Recommendations at discharge:  Follow-up with hospice -[facility] hospice house  Discharge Diagnoses: Principal Problem:   COPD with acute exacerbation ([place]) Active Problems:   Hypertension   DM type 2 causing vascular disease ([place])   Chronic diastolic CHF (congestive heart failure) ([name])   HCAP (healthcare-associated pneumonia)   AF (paroxysmal atrial fibrillation) ([name])   Hyponatremia  Resolved Problems:   * No resolved hospital problems. [facility] Hospital Course: Edward [name] is a 87 y.o. male with medical history significant for COPD and chronic hypoxemic respiratory failure on 3 L nasal cannula who was recently discharged on 6/13 after treatment for acute COPD exacerbation and metabolic encephalopathy in the setting of pneumonia.  He was discharged with a regimen of oral antibiotics and steroid taper, but it does not appear that he actually picked up these medications.  He states that he has had some persistent dyspnea that has been worsening along with a nonproductive cough for the last 2-3 weeks.  He was readmitted with acute COPD exacerbation in the setting of persistent pneumonia.  He is also now noted to have worsening AKI while being treated with vancomycin.  He was noted to have 1 blood culture positive with MRSA.  On 7/17 he was noted to be more confused and lethargic and have a code stroke called, but there was no acute CVA noted.  He was noted to have some worsening encephalopathy.  He has continued to have worsening of his overall  condition which necessitated transfer to stepdown unit for closer monitoring.  Palliative care has had further discussion with family who are now in agreement for DNR.  He appears to have very poor prognosis.      Acute respiratory failure due to acute COPD exacerbation with concern for persistent pneumonia Discontinued IV steroids, antitussives, nebs, During hospitalization: -was on IV Solu-Medrol twice daily  -DuoNebs -Antitussives -Vancomycin and cefepime empirically for HCAP   -Currently on baseline 3 L nasal cannula oxygen satting 97% -Consult to palliative care per PCCM recommendations; patient overall with very poor prognosis  Family has agreed to progress to comfort care, Hospice    Acute metabolic encephalopathy -Waxing and waning Minimal verbal conversation, noncooperative, --PCO2 and ammonia within normal limits -Code stroke called 7/17 with head CT and brain MRI with no acute findings -Likely due to Lyrica use in the setting of AKI and this has not been discontinued -Continue to monitor closely for improvement   MRSA bacteremia-unlikely -Repeat blood cultures negative -Vancomycin held for now given AKI   History of paroxysmal atrial fibrillation -Continue Eliquis at home and metoprolol for heart rate control   Chronic diastolic CHF -2D echocardiogram 1 month ago with EF 60-65% and grade 2 diastolic dysfunction -Hold Lasix for now and give gentle IV fluid given AKI   AKI on CKD stage IIIb-improving -Likely ATN due to vancomycin toxicity -Renal ultrasound with no acute findings -Hold further IV fluid for now -Repeat a.m. labs   Normocytic anemia -Continue to monitor   Hypertension -Continue home medications and monitor   Type 2  diabetes with hypoglycemia -A1c 13.6% on 6/9 -Discontinue Actos -Decreased Semglee--- now discontinued     Deconditioning/debility Nonambulatory at this point needing total care   Ethics: Patient is Now DNR/DNI, family agreed  to pursue with comfort care and' \\Hospice'$  Palliative care team has been following closely with the patient and patient's family       Consultants: Pulmonary PCCM, palliative care team Procedures performed: None Disposition: Hospice care Diet recommendation:  Discharge Diet Orders (From admission, onward)     Start     Ordered   04/20/22 0000  Diet - low sodium heart healthy        04/20/22 1142           Regular diet DISCHARGE MEDICATION:    Discharge Exam: Filed Weights   04/14/22 1440 04/19/22 0445 04/20/22 0500  Weight: 72 kg 74.3 kg 72.2 kg          Physical Exam:  Patient was seen and examined no changes to below exam remain confused, lethargic   General:  AAO x 1, pleasantly confused, less agitated cooperative, no distress;   HEENT:  Normocephalic, PERRL, otherwise with in Normal limits   Neuro:  CNII-XII intact. , normal motor and sensation, reflexes intact   Lungs:   Clear to auscultation BL, Respirations unlabored,  No wheezes / crackles  Cardio:    S1/S2, RRR, No murmure, No Rubs or Gallops   Abdomen:  Soft, non-tender, bowel sounds active all four quadrants, no guarding or peritoneal signs.  Muscular  skeletal:  Limited exam - sever global generalized weaknesses - in bed, able to move all 4 extremities,   2+ pulses,  symmetric, No pitting edema  Skin:  Dry, warm to touch, negative for any Rashes,  Wounds: Please see nursing documentation            Condition at discharge: poor  The results of significant diagnostics from this hospitalization (including imaging, microbiology, ancillary and laboratory) are listed below for reference.   Imaging Studies: DG CHEST PORT 1 VIEW  Result Date: 04/17/2022 CLINICAL DATA:  Increasing shortness of breath and productive cough in a patient with history of diabetes a pulmonary fibrosis EXAM: PORTABLE CHEST 1 VIEW COMPARISON:  April 14, 2022. FINDINGS: EKG leads project over the chest. Cardiomediastinal  contours and hilar structures are stable with persistent cardiac enlargement accentuated by AP projection and portable technique. Increasing density within LEFT upper lobe airspace disease. No new area of consolidation with scattered linear areas of atelectasis in the chest. No pneumothorax. On limited assessment no acute skeletal findings. IMPRESSION: Increasing density within the LEFT upper lobe airspace disease. Findings remain compatible with pneumonia at this time but would suggest follow-up to ensure resolution. Increasing lead defined or dense appearance could reflect evolution of the infectious process, not currently as diffuse as on the prior study. Electronically Signed   By: Zetta Bills M.D.   On: 04/17/2022 14:56   MR ANGIO HEAD WO CONTRAST  Result Date: 04/17/2022 CLINICAL DATA:  Neuro deficit, acute, stroke suspected. Encephalopathy and generalized weakness. EXAM: MRI HEAD WITHOUT CONTRAST MRA HEAD WITHOUT CONTRAST MRA NECK WITHOUT CONTRAST TECHNIQUE: Multiplanar, multiecho pulse sequences of the brain and surrounding structures were obtained without intravenous contrast. Angiographic images of the Circle of Willis were obtained using MRA technique without intravenous contrast. Angiographic images of the neck were obtained using MRA technique without intravenous contrast. Carotid stenosis measurements (when applicable) are obtained utilizing NASCET criteria, using the distal internal carotid diameter as the denominator.  COMPARISON:  Head CT 04/17/2022.  Head MRI and MRA 09/26/2017. FINDINGS: MRI HEAD FINDINGS Multiple sequences are mildly to moderately motion degraded. Brain: There is no evidence of an acute infarct, intracranial hemorrhage, mass, midline shift, or extra-axial fluid collection. T2 hyperintensities in the cerebral white matter bilaterally are similar to the prior MRI and are nonspecific but compatible with mild chronic small vessel ischemic disease. There is moderate cerebral  atrophy. Vascular: Major intracranial vascular flow voids are preserved. Skull and upper cervical spine: Unremarkable bone marrow signal. Sinuses/Orbits: Bilateral cataract extraction. Clear paranasal sinuses. Small left mastoid effusion. Other: None. MRA HEAD FINDINGS The included portions of the intracranial vertebral arteries are widely patent to the basilar. Patent PICA and SCA origins are visualized bilaterally. The basilar artery is widely patent. Posterior communicating arteries are diminutive or absent. Both PCAs are patent with multifocal severe right P2 stenoses which are new from the 2018 MRA. There is up to mild left P2 stenosis. The internal carotid arteries are patent from skull base to carotid termini without definite evidence of a high-grade stenosis, although motion artifact limits assessment. ACAs and MCAs are patent without evidence of a proximal branch occlusion or high-grade proximal stenosis. No aneurysm is identified. MRA NECK FINDINGS Evaluation is limited by extensive motion artifact and noncontrast technique. There is nondiagnostic assessment of the aortic arch, brachiocephalic and subclavian arteries, and proximal common carotid and vertebral arteries. Flow is present in the mid and distal common carotid arteries, cervical internal carotid arteries, and distal V1 through V4 segments of the vertebral arteries, however motion artifact precludes assessment for stenosis. IMPRESSION: 1. No acute intracranial abnormality. 2. Mild chronic small vessel ischemic disease and moderate cerebral atrophy. 3. Severely limited neck MRA due to motion artifact. Grossly patent cervical carotid and vertebral arteries. 4. No major intracranial arterial occlusion. Severe right P2 stenoses. Electronically Signed   By: Logan Bores M.D.   On: 04/17/2022 13:13   MR ANGIO NECK WO CONTRAST  Result Date: 04/17/2022 CLINICAL DATA:  Neuro deficit, acute, stroke suspected. Encephalopathy and generalized weakness.  EXAM: MRI HEAD WITHOUT CONTRAST MRA HEAD WITHOUT CONTRAST MRA NECK WITHOUT CONTRAST TECHNIQUE: Multiplanar, multiecho pulse sequences of the brain and surrounding structures were obtained without intravenous contrast. Angiographic images of the Circle of Willis were obtained using MRA technique without intravenous contrast. Angiographic images of the neck were obtained using MRA technique without intravenous contrast. Carotid stenosis measurements (when applicable) are obtained utilizing NASCET criteria, using the distal internal carotid diameter as the denominator. COMPARISON:  Head CT 04/17/2022.  Head MRI and MRA 09/26/2017. FINDINGS: MRI HEAD FINDINGS Multiple sequences are mildly to moderately motion degraded. Brain: There is no evidence of an acute infarct, intracranial hemorrhage, mass, midline shift, or extra-axial fluid collection. T2 hyperintensities in the cerebral white matter bilaterally are similar to the prior MRI and are nonspecific but compatible with mild chronic small vessel ischemic disease. There is moderate cerebral atrophy. Vascular: Major intracranial vascular flow voids are preserved. Skull and upper cervical spine: Unremarkable bone marrow signal. Sinuses/Orbits: Bilateral cataract extraction. Clear paranasal sinuses. Small left mastoid effusion. Other: None. MRA HEAD FINDINGS The included portions of the intracranial vertebral arteries are widely patent to the basilar. Patent PICA and SCA origins are visualized bilaterally. The basilar artery is widely patent. Posterior communicating arteries are diminutive or absent. Both PCAs are patent with multifocal severe right P2 stenoses which are new from the 2018 MRA. There is up to mild left P2 stenosis. The internal carotid arteries  are patent from skull base to carotid termini without definite evidence of a high-grade stenosis, although motion artifact limits assessment. ACAs and MCAs are patent without evidence of a proximal branch occlusion  or high-grade proximal stenosis. No aneurysm is identified. MRA NECK FINDINGS Evaluation is limited by extensive motion artifact and noncontrast technique. There is nondiagnostic assessment of the aortic arch, brachiocephalic and subclavian arteries, and proximal common carotid and vertebral arteries. Flow is present in the mid and distal common carotid arteries, cervical internal carotid arteries, and distal V1 through V4 segments of the vertebral arteries, however motion artifact precludes assessment for stenosis. IMPRESSION: 1. No acute intracranial abnormality. 2. Mild chronic small vessel ischemic disease and moderate cerebral atrophy. 3. Severely limited neck MRA due to motion artifact. Grossly patent cervical carotid and vertebral arteries. 4. No major intracranial arterial occlusion. Severe right P2 stenoses. Electronically Signed   By: Logan Bores M.D.   On: 04/17/2022 13:13   MR BRAIN WO CONTRAST  Result Date: 04/17/2022 CLINICAL DATA:  Neuro deficit, acute, stroke suspected. Encephalopathy and generalized weakness. EXAM: MRI HEAD WITHOUT CONTRAST MRA HEAD WITHOUT CONTRAST MRA NECK WITHOUT CONTRAST TECHNIQUE: Multiplanar, multiecho pulse sequences of the brain and surrounding structures were obtained without intravenous contrast. Angiographic images of the Circle of Willis were obtained using MRA technique without intravenous contrast. Angiographic images of the neck were obtained using MRA technique without intravenous contrast. Carotid stenosis measurements (when applicable) are obtained utilizing NASCET criteria, using the distal internal carotid diameter as the denominator. COMPARISON:  Head CT 04/17/2022.  Head MRI and MRA 09/26/2017. FINDINGS: MRI HEAD FINDINGS Multiple sequences are mildly to moderately motion degraded. Brain: There is no evidence of an acute infarct, intracranial hemorrhage, mass, midline shift, or extra-axial fluid collection. T2 hyperintensities in the cerebral white matter  bilaterally are similar to the prior MRI and are nonspecific but compatible with mild chronic small vessel ischemic disease. There is moderate cerebral atrophy. Vascular: Major intracranial vascular flow voids are preserved. Skull and upper cervical spine: Unremarkable bone marrow signal. Sinuses/Orbits: Bilateral cataract extraction. Clear paranasal sinuses. Small left mastoid effusion. Other: None. MRA HEAD FINDINGS The included portions of the intracranial vertebral arteries are widely patent to the basilar. Patent PICA and SCA origins are visualized bilaterally. The basilar artery is widely patent. Posterior communicating arteries are diminutive or absent. Both PCAs are patent with multifocal severe right P2 stenoses which are new from the 2018 MRA. There is up to mild left P2 stenosis. The internal carotid arteries are patent from skull base to carotid termini without definite evidence of a high-grade stenosis, although motion artifact limits assessment. ACAs and MCAs are patent without evidence of a proximal branch occlusion or high-grade proximal stenosis. No aneurysm is identified. MRA NECK FINDINGS Evaluation is limited by extensive motion artifact and noncontrast technique. There is nondiagnostic assessment of the aortic arch, brachiocephalic and subclavian arteries, and proximal common carotid and vertebral arteries. Flow is present in the mid and distal common carotid arteries, cervical internal carotid arteries, and distal V1 through V4 segments of the vertebral arteries, however motion artifact precludes assessment for stenosis. IMPRESSION: 1. No acute intracranial abnormality. 2. Mild chronic small vessel ischemic disease and moderate cerebral atrophy. 3. Severely limited neck MRA due to motion artifact. Grossly patent cervical carotid and vertebral arteries. 4. No major intracranial arterial occlusion. Severe right P2 stenoses. Electronically Signed   By: Logan Bores M.D.   On: 04/17/2022 13:13    CT HEAD CODE STROKE WO CONTRAST  Result Date: 04/17/2022 CLINICAL DATA:  Code stroke.  Neuro deficit, acute, stroke suspected EXAM: CT HEAD WITHOUT CONTRAST TECHNIQUE: Contiguous axial images were obtained from the base of the skull through the vertex without intravenous contrast. RADIATION DOSE REDUCTION: This exam was performed according to the departmental dose-optimization program which includes automated exposure control, adjustment of the mA and/or kV according to patient size and/or use of iterative reconstruction technique. COMPARISON:  CT head July 07, 2021. FINDINGS: Brain: No evidence of acute large vascular territory infarction, hemorrhage, hydrocephalus, extra-axial collection or mass lesion/mass effect. Similar chronic microvascular disease and cerebral atrophy. Vascular: No hyperdense vessel identified. Skull: No acute fracture. Sinuses/Orbits: Left maxillary sinus mucosal thinking. No acute orbital findings Other: None. ASPECTS [facility] Stroke Program Early CT Score) total score (0-10 with 10 being normal): 10. IMPRESSION: 1. No evidence of acute intracranial abnormality. 2. ASPECTS is 10. Code stroke imaging results were communicated on 04/17/2022 at 9:35 am to provider Apolonio Schneiders via telephone, who verbally acknowledged these results. Electronically Signed   By: Margaretha Sheffield M.D.   On: 04/17/2022 09:37   US RENAL  Result Date: 04/16/2022 CLINICAL DATA:  Acute renal insufficiency EXAM: RENAL / URINARY TRACT ULTRASOUND COMPLETE COMPARISON:  None Available. FINDINGS: Right Kidney: Renal measurements: 9.3 x 5.6 x 4.8 cm = volume: 131 mL. Increased cortical echogenicity. Left Kidney: Renal measurements: 9.6 x 4.7 x 4.4 cm = volume: 104.4 mL. Increased cortical echogenicity. Bladder: Appears normal for degree of bladder distention. Other: None. IMPRESSION: Medical renal disease with increased cortical echogenicity. No acute abnormalities. Electronically Signed   By: Dorise Bullion III M.D.    On: 04/16/2022 12:04   CT Chest W Contrast  Result Date: 04/14/2022 CLINICAL DATA:  Shortness of breath for 2 weeks. Abnormal chest x-ray with persistent left lung opacities. Evaluate for soft tissue mass. EXAM: CT CHEST WITH CONTRAST TECHNIQUE: Multidetector CT imaging of the chest was performed during intravenous contrast administration. RADIATION DOSE REDUCTION: This exam was performed according to the departmental dose-optimization program which includes automated exposure control, adjustment of the mA and/or kV according to patient size and/or use of iterative reconstruction technique. CONTRAST:  30m OMNIPAQUE IOHEXOL 300 MG/ML  SOLN COMPARISON:  Multiple radiographs, most recently 04/14/2022 and 03/20/2022. CT 02/28/2017. FINDINGS: Cardiovascular: Diffuse atherosclerosis of the aorta, great vessels and coronary arteries. No acute vascular findings are evident. There are calcifications of the aortic valve. The heart size is normal. There is no pericardial effusion. Mediastinum/Nodes: There are no enlarged mediastinal, hilar or axillary lymph nodes.Mildly prominent left infrahilar lymph nodes, likely reactive. The thyroid gland, trachea and esophagus demonstrate no significant findings. Lungs/Pleura: Trace left pleural effusion. No right pleural effusion or pneumothorax. Underlying moderate centrilobular and paraseptal emphysema. On the remote prior chest CT, there were extensive right upper and bilateral lower lobe infiltrates which have resolved. Mild residual right lung scarring is noted. There is focal airspace disease peripherally and anteriorly in the left upper lobe, as seen on recent radiographs. These opacities were first seen 03/10/2022 and are new from 02/07/2022, and therefore consistent with pneumonia. In review of prior radiographs, there appears to be mild improvement from 03/10/2022. There is some associated central airway thickening and luminal narrowing, and as above, probable reactive  left hilar adenopathy. No focal lung mass identified to suggest malignancy. Upper abdomen: The visualized upper abdomen appears stable without acute findings. There is fatty replacement of the pancreas and aortic atherosclerosis. Musculoskeletal/Chest wall: There is no chest wall mass or suspicious osseous finding.  Bilateral gynecomastia, similar to previous study. Multilevel spondylosis. IMPRESSION: 1. Persistent left upper lobe airspace disease, new from radiographs of 02/07/2022, but incompletely resolved. Findings remain most consistent with pneumonia. Continued radiographic follow-up in 4-6 weeks recommended. 2. No focal lung mass identified to suggest malignancy. There is left hilar adenopathy which is nonspecific, although likely reactive. Mild associated central airway thickening and luminal narrowing. 3. Coronary and aortic atherosclerosis (ICD10-I70.0). Emphysema (ICD10-J43.9). Electronically Signed   By: Richardean Sale M.D.   On: 04/14/2022 09:24   DG Chest 2 View  Result Date: 04/14/2022 CLINICAL DATA:  SOB EXAM: CHEST - 2 VIEW COMPARISON:  Chest x-ray June 19, 23. FINDINGS: Slightly progressed interstitial and airspace opacities in the left mid and upper lung. No visible pleural effusions pneumothorax. Similar cardiomediastinal silhouette. No acute osseous abnormality. IMPRESSION: Slightly progressed interstitial and airspace opacities in the left mid and upper lung, concerning for pneumonia. Given persistence, recommend CT chest (preferably with contrast) to further evaluate and exclude alternative etiologies such as malignancy. Electronically Signed   By: Margaretha Sheffield M.D.   On: 04/14/2022 08:31    Microbiology: Results for orders placed or performed during the hospital encounter of 04/14/22  Culture, blood (Routine X 2) w Reflex to ID Panel     Status: None   Collection Time: 04/16/22  7:17 AM   Specimen: BLOOD LEFT HAND  Result Value Ref Range Status   Specimen Description   Final     BLOOD LEFT HAND BOTTLES DRAWN AEROBIC AND ANAEROBIC   Special Requests Blood Culture adequate volume  Final   Culture   Final    NO GROWTH 5 DAYS Performed at [facility], [address], [place], [place] [address]    Report Status 04/21/2022 FINAL  Final  Culture, blood (Routine X 2) w Reflex to ID Panel     Status: None   Collection Time: 04/16/22  7:17 AM   Specimen: BLOOD RIGHT HAND  Result Value Ref Range Status   Specimen Description BLOOD RIGHT HAND BOTTLES DRAWN AEROBIC ONLY  Final   Special Requests Blood Culture adequate volume  Final   Culture   Final    NO GROWTH 5 DAYS Performed at [facility], [address] [place], [place] [address]    Report Status 04/21/2022 FINAL  Final  MRSA Next Gen by PCR, Nasal     Status: Abnormal   Collection Time: 04/19/22 11:24 AM   Specimen: Nasal Mucosa; Nasal Swab  Result Value Ref Range Status   MRSA by PCR Next Gen DETECTED (A) NOT DETECTED Final    Comment: RESULT CALLED TO, READ BACK BY AND VERIFIED WITH: JEFF STRENK @ 1409 ON 04/19/22 C VARNER (NOTE) The GeneXpert MRSA Assay (FDA approved for NASAL specimens only), is one component of a comprehensive MRSA colonization surveillance program. It is not intended to diagnose MRSA infection nor to guide or monitor treatment for MRSA infections. Test performance is not FDA approved in patients less than 90 years old. Performed at [facility], [address], [place], [place] [address]     Labs: CBC: Recent Labs  Lab 04/17/22 0435 04/18/22 0511 04/20/22 0403  WBC 11.8* 9.1 9.7  HGB 10.0* 10.9* 11.3*  HCT 31.7* 34.1* 35.5*  MCV 88.8 87.2 86.2  PLT 267 316 702   Basic Metabolic Panel: Recent Labs  Lab 04/17/22 0435 04/18/22 0511 04/20/22 0403  NA 134* 138 147*  K 4.8 4.5 4.4  CL 106 109 121*  CO2 18* 19* 16*  GLUCOSE 147* 49* 112*  BUN 89* 98* 80*  CREATININE 2.65* 2.49* 1.67*  CALCIUM 7.8* 8.0* 8.5*  MG 2.8* 3.0*  --    Liver Function Tests: Recent Labs   Lab 04/17/22 0920 04/18/22 0511 04/20/22 0403  AST 11* 12* 16  ALT '11 11 13  '$ ALKPHOS 55 52 59  BILITOT 0.4 0.5 0.6  PROT 6.4* 6.4* 6.4*  ALBUMIN 2.8* 2.7* 2.9*   CBG: Recent Labs  Lab 04/19/22 1152 04/19/22 1553 04/19/22 2105 04/20/22 0731 04/20/22 1128  GLUCAP 70 72 83 147* 186*    Discharge time spent: greater than 30 minutes.  Signed: Deatra James, MD [facility] 04/23/2022

## 2022-04-24 MED ORDER — ALBUTEROL SULFATE HFA 108 (90 BASE) MCG/ACT IN AERS
2.0000 | INHALATION_SPRAY | Freq: Once | RESPIRATORY_TRACT | Status: AC
Start: 2022-04-24 — End: 2022-04-24

## 2022-04-24 MED ORDER — ALBUTEROL SULFATE HFA 108 (90 BASE) MCG/ACT IN AERS
INHALATION_SPRAY | RESPIRATORY_TRACT | Status: AC
  Filled 2022-04-24: qty 6.7

## 2022-04-24 MED ORDER — ALBUTEROL SULFATE (2.5 MG/3ML) 0.083% IN NEBU: 2.5000 mg | INHALATION_SOLUTION | Freq: Once | RESPIRATORY_TRACT | Status: DC

## 2022-04-24 NOTE — Progress Notes (Signed)
Report called to Lana at [facility], patient transported via EMS to hospice home via stretcher

## 2022-04-24 NOTE — TOC Transition Note (Signed)
Transition of Care [facility]) - CM/SW Discharge Note   Patient Details  Name: Edward [name] MRN: [id number] Date of Birth: 1934/11/14  [facility] of [facility]) CM/SW Contact:  Ihor Gully, LCSW Phone Number: 04/24/2022, 12:49 PM   Clinical Narrative:    Hospice will arrange for EMS transport around 3 today. Patient will d/c to [place]. Son is aware.    Final next level of care: [place] Barriers to Discharge: No Barriers Identified   Patient Goals and CMS Choice        Discharge Placement                Patient to be transferred to facility by: [place] arranged by Hospice Name of family member notified: son is aware Patient and family notified of of transfer: 04/24/22  Discharge Plan and Services                                     Social Determinants of Health (SDOH) Interventions     Readmission Risk Interventions    04/20/2020    2:57 PM 04/19/2020    4:27 PM 04/19/2020   11:18 AM  Readmission Risk Prevention Plan  Transportation Screening   Complete  Medication Review ([place])   Complete  PCP or Specialist appointment within 3-5 days of discharge Not Complete    PCP/Specialist Appt Not Complete comments appointment is scheduled for 04/26/20 which is six days. Labs to are to be drawn every Monday per attending request.    [place] or [place]  Complete   SW Recovery Care/Counseling Consult   Complete  Palliative Care Screening   Not Applicable

## 2022-04-24 NOTE — Progress Notes (Signed)
Patient alert at times , will respond verbally, however unable to understand what he is saying at times, patient turned and repositioned Q 2 hours . No s/s of pain or discomfort, Oral care provided to patient as he has poor oral intake at this time.

## 2022-04-24 NOTE — TOC Progression Note (Signed)
Transition of Care [facility]) - Progression Note    Patient Details  Name: LAW CORSINO MRN: [id number] Date of Birth: 11-May-1935  Transition of Care [facility]) CM/SW Contact  Ihor Gully, LCSW Phone Number: 04/24/2022, 11:21 AM  Clinical Narrative:    Spoke with patient's son, [name]. [name] to go to Hospice to sign consents today.    Expected Discharge Plan: [place] Barriers to Discharge: Continued Medical Work up  Expected Discharge Plan and Services Expected Discharge Plan: [place]         Expected Discharge Date: 04/24/22                                     Social Determinants of Health (SDOH) Interventions    Readmission Risk Interventions    04/20/2020    2:57 PM 04/19/2020    4:27 PM 04/19/2020   11:18 AM  Readmission Risk Prevention Plan  Transportation Screening   Complete  Medication Review (RN Care Manager)   Complete  PCP or Specialist appointment within 3-5 days of discharge Not Complete    PCP/Specialist Appt Not Complete comments appointment is scheduled for 04/26/20 which is six days. Labs to are to be drawn every Monday per attending request.    [place] or [place]  Complete   SW Recovery Care/Counseling Consult   Complete  Palliative Care Screening   Not Applicable

## 2022-04-24 NOTE — Care Management Important Message (Deleted)
Important Message  Patient Details  Name: Edward [name] MRN: [id number] Date of Birth: [birth year]   Medicare Important Message Given:  Yes     Tommy Medal 04/24/2022, 10:25 AM

## 2022-04-24 NOTE — Discharge Summary (Signed)
Physician Discharge Summary   Patient: Edward [name] MRN: [id number] DOB: 1934-11-05  Admit date:     04/14/2022  Discharge date: 04/24/22  Discharge Physician: Deatra James   PCP: Susy Frizzle, MD    The patient was seen and examined this morning, somnolent, overnight agitation, hemodynamically stable... Comfort care orders has been placed Ready to be discharged.  Disposition: pending discharge to Hospice -awaiting bed availability    Recommendations at discharge:  Follow-up with hospice -[facility]  Discharge Diagnoses: Principal Problem:   COPD with acute exacerbation ([place]) Active Problems:   Hypertension   DM type 2 causing vascular disease ([place])   Chronic diastolic CHF (congestive heart failure) ([name])   HCAP (healthcare-associated pneumonia)   AF (paroxysmal atrial fibrillation) ([name])   Hyponatremia  Resolved Problems:   * No resolved hospital problems. [facility] Course: Edward [name] is a 87 y.o. male with medical history significant for COPD and chronic hypoxemic respiratory failure on 3 L nasal cannula who was recently discharged on 6/13 after treatment for acute COPD exacerbation and metabolic encephalopathy in the setting of pneumonia.  He was discharged with a regimen of oral antibiotics and steroid taper, but it does not appear that he actually picked up these medications.  He states that he has had some persistent dyspnea that has been worsening along with a nonproductive cough for the last 2-3 weeks.  He was readmitted with acute COPD exacerbation in the setting of persistent pneumonia.  He is also now noted to have worsening AKI while being treated with vancomycin.  He was noted to have 1 blood culture positive with MRSA.  On 7/17 he was noted to be more confused and lethargic and have a code stroke called, but there was no acute CVA noted.  He was noted to have some worsening encephalopathy.  He has continued to have worsening of his overall  condition which necessitated transfer to stepdown unit for closer monitoring.  Palliative care has had further discussion with family who are now in agreement for DNR.  He appears to have very poor prognosis.      Acute respiratory failure due to acute COPD exacerbation with concern for persistent pneumonia Discontinued IV steroids, antitussives, nebs, During hospitalization: -was on IV Solu-Medrol twice daily  -DuoNebs -Antitussives -Vancomycin and cefepime empirically for HCAP   -Currently on baseline 3 L nasal cannula oxygen satting 97% -Consult to palliative care per PCCM recommendations; patient overall with very poor prognosis  Family has agreed to progress to comfort care, Hospice    Acute metabolic encephalopathy -Waxing and waning Minimal verbal conversation, noncooperative, --PCO2 and ammonia within normal limits -Code stroke called 7/17 with head CT and brain MRI with no acute findings -Likely due to Lyrica use in the setting of AKI and this has not been discontinued -Continue to monitor closely for improvement   MRSA bacteremia-unlikely -Repeat blood cultures negative -Vancomycin held for now given AKI   History of paroxysmal atrial fibrillation -Continue Eliquis at home and metoprolol for heart rate control   Chronic diastolic CHF -2D echocardiogram 1 month ago with EF 60-65% and grade 2 diastolic dysfunction -Hold Lasix for now and give gentle IV fluid given AKI   AKI on CKD stage IIIb-improving -Likely ATN due to vancomycin toxicity -Renal ultrasound with no acute findings -Hold further IV fluid for now -Repeat a.m. labs   Normocytic anemia -Continue to monitor   Hypertension -Continue home medications and monitor   Type 2  diabetes with hypoglycemia -A1c 13.6% on 6/9 -Discontinue Actos -Decreased Semglee--- now discontinued     Deconditioning/debility Nonambulatory at this point needing total care   Ethics: Patient is Now DNR/DNI, family agreed  to pursue with comfort care and' \\Hospice'$  Palliative care team has been following closely with the patient and patient's family       Consultants: Pulmonary PCCM, palliative care team Procedures performed: None Disposition: Hospice care Diet recommendation:  Discharge Diet Orders (From admission, onward)     Start     Ordered   04/20/22 0000  Diet - low sodium heart healthy        04/20/22 1142           Regular diet DISCHARGE MEDICATION:    Discharge Exam: Filed Weights   04/14/22 1440 04/19/22 0445 04/20/22 0500  Weight: 72 kg 74.3 kg 72.2 kg          Physical Exam:  Patient was seen and examined no changes to below exam remain confused, lethargic Somnolent   General:  Somnolent  HEENT:  Normocephalic, PERRL, otherwise with in Normal limits   Neuro:  Patient is somnolent Limited exam-CNII-XII intact. , normal motor and sensation, reflexes intact   Lungs:   Clear to auscultation BL, Respirations unlabored,  No wheezes / crackles  Cardio:    S1/S2, RRR, No murmure, No Rubs or Gallops   Abdomen:  Soft, non-tender, bowel sounds active all four quadrants, no guarding or peritoneal signs.  Muscular  skeletal:  Limited exam - sever global generalized weaknesses - in bed, able to move all 4 extremities,   2+ pulses,  symmetric, No pitting edema  Skin:  Dry, warm to touch, negative for any Rashes,  Wounds: Please see nursing documentation            Condition at discharge: poor  The results of significant diagnostics from this hospitalization (including imaging, microbiology, ancillary and laboratory) are listed below for reference.   Imaging Studies: DG CHEST PORT 1 VIEW  Result Date: 04/17/2022 CLINICAL DATA:  Increasing shortness of breath and productive cough in a patient with history of diabetes a pulmonary fibrosis EXAM: PORTABLE CHEST 1 VIEW COMPARISON:  April 14, 2022. FINDINGS: EKG leads project over the chest. Cardiomediastinal contours and  hilar structures are stable with persistent cardiac enlargement accentuated by AP projection and portable technique. Increasing density within LEFT upper lobe airspace disease. No new area of consolidation with scattered linear areas of atelectasis in the chest. No pneumothorax. On limited assessment no acute skeletal findings. IMPRESSION: Increasing density within the LEFT upper lobe airspace disease. Findings remain compatible with pneumonia at this time but would suggest follow-up to ensure resolution. Increasing lead defined or dense appearance could reflect evolution of the infectious process, not currently as diffuse as on the prior study. Electronically Signed   By: Zetta Bills M.D.   On: 04/17/2022 14:56   MR ANGIO HEAD WO CONTRAST  Result Date: 04/17/2022 CLINICAL DATA:  Neuro deficit, acute, stroke suspected. Encephalopathy and generalized weakness. EXAM: MRI HEAD WITHOUT CONTRAST MRA HEAD WITHOUT CONTRAST MRA NECK WITHOUT CONTRAST TECHNIQUE: Multiplanar, multiecho pulse sequences of the brain and surrounding structures were obtained without intravenous contrast. Angiographic images of the Circle of Willis were obtained using MRA technique without intravenous contrast. Angiographic images of the neck were obtained using MRA technique without intravenous contrast. Carotid stenosis measurements (when applicable) are obtained utilizing NASCET criteria, using the distal internal carotid diameter as the denominator. COMPARISON:  Head CT 04/17/2022.  Head MRI and MRA 09/26/2017. FINDINGS: MRI HEAD FINDINGS Multiple sequences are mildly to moderately motion degraded. Brain: There is no evidence of an acute infarct, intracranial hemorrhage, mass, midline shift, or extra-axial fluid collection. T2 hyperintensities in the cerebral white matter bilaterally are similar to the prior MRI and are nonspecific but compatible with mild chronic small vessel ischemic disease. There is moderate cerebral atrophy.  Vascular: Major intracranial vascular flow voids are preserved. Skull and upper cervical spine: Unremarkable bone marrow signal. Sinuses/Orbits: Bilateral cataract extraction. Clear paranasal sinuses. Small left mastoid effusion. Other: None. MRA HEAD FINDINGS The included portions of the intracranial vertebral arteries are widely patent to the basilar. Patent PICA and SCA origins are visualized bilaterally. The basilar artery is widely patent. Posterior communicating arteries are diminutive or absent. Both PCAs are patent with multifocal severe right P2 stenoses which are new from the 2018 MRA. There is up to mild left P2 stenosis. The internal carotid arteries are patent from skull base to carotid termini without definite evidence of a high-grade stenosis, although motion artifact limits assessment. ACAs and MCAs are patent without evidence of a proximal branch occlusion or high-grade proximal stenosis. No aneurysm is identified. MRA NECK FINDINGS Evaluation is limited by extensive motion artifact and noncontrast technique. There is nondiagnostic assessment of the aortic arch, brachiocephalic and subclavian arteries, and proximal common carotid and vertebral arteries. Flow is present in the mid and distal common carotid arteries, cervical internal carotid arteries, and distal V1 through V4 segments of the vertebral arteries, however motion artifact precludes assessment for stenosis. IMPRESSION: 1. No acute intracranial abnormality. 2. Mild chronic small vessel ischemic disease and moderate cerebral atrophy. 3. Severely limited neck MRA due to motion artifact. Grossly patent cervical carotid and vertebral arteries. 4. No major intracranial arterial occlusion. Severe right P2 stenoses. Electronically Signed   By: Logan Bores M.D.   On: 04/17/2022 13:13   MR ANGIO NECK WO CONTRAST  Result Date: 04/17/2022 CLINICAL DATA:  Neuro deficit, acute, stroke suspected. Encephalopathy and generalized weakness. EXAM: MRI  HEAD WITHOUT CONTRAST MRA HEAD WITHOUT CONTRAST MRA NECK WITHOUT CONTRAST TECHNIQUE: Multiplanar, multiecho pulse sequences of the brain and surrounding structures were obtained without intravenous contrast. Angiographic images of the Circle of Willis were obtained using MRA technique without intravenous contrast. Angiographic images of the neck were obtained using MRA technique without intravenous contrast. Carotid stenosis measurements (when applicable) are obtained utilizing NASCET criteria, using the distal internal carotid diameter as the denominator. COMPARISON:  Head CT 04/17/2022.  Head MRI and MRA 09/26/2017. FINDINGS: MRI HEAD FINDINGS Multiple sequences are mildly to moderately motion degraded. Brain: There is no evidence of an acute infarct, intracranial hemorrhage, mass, midline shift, or extra-axial fluid collection. T2 hyperintensities in the cerebral white matter bilaterally are similar to the prior MRI and are nonspecific but compatible with mild chronic small vessel ischemic disease. There is moderate cerebral atrophy. Vascular: Major intracranial vascular flow voids are preserved. Skull and upper cervical spine: Unremarkable bone marrow signal. Sinuses/Orbits: Bilateral cataract extraction. Clear paranasal sinuses. Small left mastoid effusion. Other: None. MRA HEAD FINDINGS The included portions of the intracranial vertebral arteries are widely patent to the basilar. Patent PICA and SCA origins are visualized bilaterally. The basilar artery is widely patent. Posterior communicating arteries are diminutive or absent. Both PCAs are patent with multifocal severe right P2 stenoses which are new from the 2018 MRA. There is up to mild left P2 stenosis. The internal carotid arteries are patent from skull base to  carotid termini without definite evidence of a high-grade stenosis, although motion artifact limits assessment. ACAs and MCAs are patent without evidence of a proximal branch occlusion or  high-grade proximal stenosis. No aneurysm is identified. MRA NECK FINDINGS Evaluation is limited by extensive motion artifact and noncontrast technique. There is nondiagnostic assessment of the aortic arch, brachiocephalic and subclavian arteries, and proximal common carotid and vertebral arteries. Flow is present in the mid and distal common carotid arteries, cervical internal carotid arteries, and distal V1 through V4 segments of the vertebral arteries, however motion artifact precludes assessment for stenosis. IMPRESSION: 1. No acute intracranial abnormality. 2. Mild chronic small vessel ischemic disease and moderate cerebral atrophy. 3. Severely limited neck MRA due to motion artifact. Grossly patent cervical carotid and vertebral arteries. 4. No major intracranial arterial occlusion. Severe right P2 stenoses. Electronically Signed   By: Logan Bores M.D.   On: 04/17/2022 13:13   MR BRAIN WO CONTRAST  Result Date: 04/17/2022 CLINICAL DATA:  Neuro deficit, acute, stroke suspected. Encephalopathy and generalized weakness. EXAM: MRI HEAD WITHOUT CONTRAST MRA HEAD WITHOUT CONTRAST MRA NECK WITHOUT CONTRAST TECHNIQUE: Multiplanar, multiecho pulse sequences of the brain and surrounding structures were obtained without intravenous contrast. Angiographic images of the Circle of Willis were obtained using MRA technique without intravenous contrast. Angiographic images of the neck were obtained using MRA technique without intravenous contrast. Carotid stenosis measurements (when applicable) are obtained utilizing NASCET criteria, using the distal internal carotid diameter as the denominator. COMPARISON:  Head CT 04/17/2022.  Head MRI and MRA 09/26/2017. FINDINGS: MRI HEAD FINDINGS Multiple sequences are mildly to moderately motion degraded. Brain: There is no evidence of an acute infarct, intracranial hemorrhage, mass, midline shift, or extra-axial fluid collection. T2 hyperintensities in the cerebral white matter  bilaterally are similar to the prior MRI and are nonspecific but compatible with mild chronic small vessel ischemic disease. There is moderate cerebral atrophy. Vascular: Major intracranial vascular flow voids are preserved. Skull and upper cervical spine: Unremarkable bone marrow signal. Sinuses/Orbits: Bilateral cataract extraction. Clear paranasal sinuses. Small left mastoid effusion. Other: None. MRA HEAD FINDINGS The included portions of the intracranial vertebral arteries are widely patent to the basilar. Patent PICA and SCA origins are visualized bilaterally. The basilar artery is widely patent. Posterior communicating arteries are diminutive or absent. Both PCAs are patent with multifocal severe right P2 stenoses which are new from the 2018 MRA. There is up to mild left P2 stenosis. The internal carotid arteries are patent from skull base to carotid termini without definite evidence of a high-grade stenosis, although motion artifact limits assessment. ACAs and MCAs are patent without evidence of a proximal branch occlusion or high-grade proximal stenosis. No aneurysm is identified. MRA NECK FINDINGS Evaluation is limited by extensive motion artifact and noncontrast technique. There is nondiagnostic assessment of the aortic arch, brachiocephalic and subclavian arteries, and proximal common carotid and vertebral arteries. Flow is present in the mid and distal common carotid arteries, cervical internal carotid arteries, and distal V1 through V4 segments of the vertebral arteries, however motion artifact precludes assessment for stenosis. IMPRESSION: 1. No acute intracranial abnormality. 2. Mild chronic small vessel ischemic disease and moderate cerebral atrophy. 3. Severely limited neck MRA due to motion artifact. Grossly patent cervical carotid and vertebral arteries. 4. No major intracranial arterial occlusion. Severe right P2 stenoses. Electronically Signed   By: Logan Bores M.D.   On: 04/17/2022 13:13    CT HEAD CODE STROKE WO CONTRAST  Result Date: 04/17/2022 CLINICAL DATA:  Code stroke.  Neuro deficit, acute, stroke suspected EXAM: CT HEAD WITHOUT CONTRAST TECHNIQUE: Contiguous axial images were obtained from the base of the skull through the vertex without intravenous contrast. RADIATION DOSE REDUCTION: This exam was performed according to the departmental dose-optimization program which includes automated exposure control, adjustment of the mA and/or kV according to patient size and/or use of iterative reconstruction technique. COMPARISON:  CT head July 07, 2021. FINDINGS: Brain: No evidence of acute large vascular territory infarction, hemorrhage, hydrocephalus, extra-axial collection or mass lesion/mass effect. Similar chronic microvascular disease and cerebral atrophy. Vascular: No hyperdense vessel identified. Skull: No acute fracture. Sinuses/Orbits: Left maxillary sinus mucosal thinking. No acute orbital findings Other: None. ASPECTS [facility] Stroke Program Early CT Score) total score (0-10 with 10 being normal): 10. IMPRESSION: 1. No evidence of acute intracranial abnormality. 2. ASPECTS is 10. Code stroke imaging results were communicated on 04/17/2022 at 9:35 am to provider Apolonio Schneiders via telephone, who verbally acknowledged these results. Electronically Signed   By: Margaretha Sheffield M.D.   On: 04/17/2022 09:37   US RENAL  Result Date: 04/16/2022 CLINICAL DATA:  Acute renal insufficiency EXAM: RENAL / URINARY TRACT ULTRASOUND COMPLETE COMPARISON:  None Available. FINDINGS: Right Kidney: Renal measurements: 9.3 x 5.6 x 4.8 cm = volume: 131 mL. Increased cortical echogenicity. Left Kidney: Renal measurements: 9.6 x 4.7 x 4.4 cm = volume: 104.4 mL. Increased cortical echogenicity. Bladder: Appears normal for degree of bladder distention. Other: None. IMPRESSION: Medical renal disease with increased cortical echogenicity. No acute abnormalities. Electronically Signed   By: Dorise Bullion III M.D.    On: 04/16/2022 12:04   CT Chest W Contrast  Result Date: 04/14/2022 CLINICAL DATA:  Shortness of breath for 2 weeks. Abnormal chest x-ray with persistent left lung opacities. Evaluate for soft tissue mass. EXAM: CT CHEST WITH CONTRAST TECHNIQUE: Multidetector CT imaging of the chest was performed during intravenous contrast administration. RADIATION DOSE REDUCTION: This exam was performed according to the departmental dose-optimization program which includes automated exposure control, adjustment of the mA and/or kV according to patient size and/or use of iterative reconstruction technique. CONTRAST:  6m OMNIPAQUE IOHEXOL 300 MG/ML  SOLN COMPARISON:  Multiple radiographs, most recently 04/14/2022 and 03/20/2022. CT 02/28/2017. FINDINGS: Cardiovascular: Diffuse atherosclerosis of the aorta, great vessels and coronary arteries. No acute vascular findings are evident. There are calcifications of the aortic valve. The heart size is normal. There is no pericardial effusion. Mediastinum/Nodes: There are no enlarged mediastinal, hilar or axillary lymph nodes.Mildly prominent left infrahilar lymph nodes, likely reactive. The thyroid gland, trachea and esophagus demonstrate no significant findings. Lungs/Pleura: Trace left pleural effusion. No right pleural effusion or pneumothorax. Underlying moderate centrilobular and paraseptal emphysema. On the remote prior chest CT, there were extensive right upper and bilateral lower lobe infiltrates which have resolved. Mild residual right lung scarring is noted. There is focal airspace disease peripherally and anteriorly in the left upper lobe, as seen on recent radiographs. These opacities were first seen 03/10/2022 and are new from 02/07/2022, and therefore consistent with pneumonia. In review of prior radiographs, there appears to be mild improvement from 03/10/2022. There is some associated central airway thickening and luminal narrowing, and as above, probable reactive  left hilar adenopathy. No focal lung mass identified to suggest malignancy. Upper abdomen: The visualized upper abdomen appears stable without acute findings. There is fatty replacement of the pancreas and aortic atherosclerosis. Musculoskeletal/Chest wall: There is no chest wall mass or suspicious osseous finding. Bilateral gynecomastia, similar to previous study.  Multilevel spondylosis. IMPRESSION: 1. Persistent left upper lobe airspace disease, new from radiographs of 02/07/2022, but incompletely resolved. Findings remain most consistent with pneumonia. Continued radiographic follow-up in 4-6 weeks recommended. 2. No focal lung mass identified to suggest malignancy. There is left hilar adenopathy which is nonspecific, although likely reactive. Mild associated central airway thickening and luminal narrowing. 3. Coronary and aortic atherosclerosis (ICD10-I70.0). Emphysema (ICD10-J43.9). Electronically Signed   By: Richardean Sale M.D.   On: 04/14/2022 09:24   DG Chest 2 View  Result Date: 04/14/2022 CLINICAL DATA:  SOB EXAM: CHEST - 2 VIEW COMPARISON:  Chest x-ray June 19, 23. FINDINGS: Slightly progressed interstitial and airspace opacities in the left mid and upper lung. No visible pleural effusions pneumothorax. Similar cardiomediastinal silhouette. No acute osseous abnormality. IMPRESSION: Slightly progressed interstitial and airspace opacities in the left mid and upper lung, concerning for pneumonia. Given persistence, recommend CT chest (preferably with contrast) to further evaluate and exclude alternative etiologies such as malignancy. Electronically Signed   By: Margaretha Sheffield M.D.   On: 04/14/2022 08:31    Microbiology: Results for orders placed or performed during the hospital encounter of 04/14/22  Culture, blood (Routine X 2) w Reflex to ID Panel     Status: None   Collection Time: 04/16/22  7:17 AM   Specimen: BLOOD LEFT HAND  Result Value Ref Range Status   Specimen Description   Final     BLOOD LEFT HAND BOTTLES DRAWN AEROBIC AND ANAEROBIC   Special Requests Blood Culture adequate volume  Final   Culture   Final    NO GROWTH 5 DAYS Performed at [facility], [address], [name], [place] [address]    Report Status 04/21/2022 FINAL  Final  Culture, blood (Routine X 2) w Reflex to ID Panel     Status: None   Collection Time: 04/16/22  7:17 AM   Specimen: BLOOD RIGHT HAND  Result Value Ref Range Status   Specimen Description BLOOD RIGHT HAND BOTTLES DRAWN AEROBIC ONLY  Final   Special Requests Blood Culture adequate volume  Final   Culture   Final    NO GROWTH 5 DAYS Performed at [facility], [address], [place], [place] [address]    Report Status 04/21/2022 FINAL  Final  MRSA Next Gen by PCR, Nasal     Status: Abnormal   Collection Time: 04/19/22 11:24 AM   Specimen: Nasal Mucosa; Nasal Swab  Result Value Ref Range Status   MRSA by PCR Next Gen DETECTED (A) NOT DETECTED Final    Comment: RESULT CALLED TO, READ BACK BY AND VERIFIED WITH: JEFF STRENK @ 1409 ON 04/19/22 C VARNER (NOTE) The GeneXpert MRSA Assay (FDA approved for NASAL specimens only), is one component of a comprehensive MRSA colonization surveillance program. It is not intended to diagnose MRSA infection nor to guide or monitor treatment for MRSA infections. Test performance is not FDA approved in patients less than 33 years old. Performed at [facility], [address] [place], [place] [address]     Labs: CBC: Recent Labs  Lab 04/18/22 0511 04/20/22 0403  WBC 9.1 9.7  HGB 10.9* 11.3*  HCT 34.1* 35.5*  MCV 87.2 86.2  PLT 316 784   Basic Metabolic Panel: Recent Labs  Lab 04/18/22 0511 04/20/22 0403  NA 138 147*  K 4.5 4.4  CL 109 121*  CO2 19* 16*  GLUCOSE 49* 112*  BUN 98* 80*  CREATININE 2.49* 1.67*  CALCIUM 8.0* 8.5*  MG 3.0*  --  Liver Function Tests: Recent Labs  Lab 04/18/22 0511 04/20/22 0403  AST 12* 16  ALT 11 13  ALKPHOS 52 59  BILITOT 0.5 0.6  PROT  6.4* 6.4*  ALBUMIN 2.7* 2.9*   CBG: Recent Labs  Lab 04/19/22 1152 04/19/22 1553 04/19/22 2105 04/20/22 0731 04/20/22 1128  GLUCAP 70 72 83 147* 186*    Discharge time spent: greater than 30 minutes.  Signed: Deatra James, MD [facility] 04/24/2022

## 2022-05-02 DEATH — deceased

## 2022-05-04 ENCOUNTER — Other Ambulatory Visit: Payer: Self-pay

## 2022-05-24 ENCOUNTER — Encounter (HOSPITAL_COMMUNITY): Payer: Self-pay | Admitting: Internal Medicine

## 2022-06-12 ENCOUNTER — Other Ambulatory Visit: Payer: Self-pay

## 2022-06-19 NOTE — Telephone Encounter (Signed)
I am routing this message to a nurse since I don't have security to close it.  This encounter is from the past and has been addressed.

## 2022-07-05 ENCOUNTER — Telehealth: Payer: Self-pay | Admitting: Pharmacist

## 2022-07-05 NOTE — Progress Notes (Signed)
Chronic Care Management Pharmacy Assistant   Name: Edward [name]  MRN: [id number] DOB: 08/24/1935   Reason for Encounter: COPD Adherence Call    Recent office visits:  03/24/2022 OV (PCP) Edward Frizzle, MD;  I asked him to take Wixela 1 inhalation twice daily.  I showed him how to do the Spiriva and watched him take a dose today.  I asked him to do this once a day.  This is written down for him on a piece of paper.  I directly told the son to perform these 2 maintenance therapies with the patient every day or else he will have another exacerbation and shortly be back in the hospital.  I asked him to increase the Lantus to 40 units a day.  I gave the patient refills on both maintenance inhalers for a year.  02/10/2022 OV (PCP) Edward Frizzle, MD;  He states that he cannot afford the Symbicort.  Therefore he has not been refilling the prescription.  As result I will switch him to Wixela 500/50, 1 inhalation twice daily.  Hopefully the generic version of Advair will be more affordable.  Recent consult visits:    Hospital visits:  04/14/2022 ED to Hospital Admission Admit date:     04/14/2022  Discharge date: 04/24/22  -Consult to palliative care per PCCM recommendations; patient overall with very poor prognosis  Family has agreed to progress to comfort care, Hospice -Discontinue Actos -Decreased Semglee--- now discontinued  03/20/2022 ED visit for Hyperglycemia -No medication changes  03/10/2022 ED to Hospital Admission due to Acute respiratory failure Admit Date: 03/10/2022 Discharge date: 03/14/2022 -I have asked the son to slowly increase his Lantus regimen back to 45 units if his sugars start going up.   -Eliquis has been reduced to 2.5 mg  STOP taking these medications     aspirin EC 81 MG tablet    cloNIDine 0.1 MG tablet Commonly known as: CATAPRES    gabapentin 100 MG capsule Commonly known as: NEURONTIN    ipratropium-albuterol 0.5-2.5 (3) MG/3ML Soln Commonly  known as: DUONEB    lidocaine 4 % Commonly known as: HM Lidocaine Patch    losartan 25 MG tablet Commonly known as: Cozaar    SYMBICORT IN    tamsulosin 0.4 MG Caps capsule Commonly known as: FLOMAX    traMADol 50 MG tablet Commonly known as: ULTRAM           TAKE these medications     acetaminophen 500 MG tablet Commonly known as: TYLENOL Take 500 mg by mouth every 6 (six) hours as needed for headache.    albuterol (2.5 MG/3ML) 0.083% nebulizer solution Commonly known as: PROVENTIL INHALE 3ML BY NEBULIZATION ROUTE EVERY 6 HOURS AS NEEDED FOR WHEEZING OR SHORTNESS OF BREATH What changed: See the new instructions.    apixaban 2.5 MG Tabs tablet Commonly known as: Eliquis Take 1 tablet (2.5 mg total) by mouth 2 (two) times daily. What changed:  medication strength how much to take    cefdinir 300 MG capsule Commonly known as: OMNICEF Take 1 capsule (300 mg total) by mouth 2 (two) times daily for 2 days.    fluticasone-salmeterol 500-50 MCG/ACT Aepb Commonly known as: Wixela Inhub Inhale 1 puff into the lungs in the morning and at bedtime. This replaces symbicort,    furosemide 40 MG tablet Commonly known as: LASIX Take 1 tablet (40 mg total) by mouth daily.    Lantus SoloStar 100 UNIT/ML Solostar Pen Generic drug: insulin  glargine Inject 15 Units into the skin at bedtime. What changed: how much to take    metoprolol tartrate 25 MG tablet Commonly known as: LOPRESSOR TAKE 1 TABLET BY MOUTH TWICE DAILY    OneTouch Delica Plus [id number] Misc USE TO CHECK BLOOD SUGAR TWICE DAILY AS DIRECTED    OneTouch Ultra test strip Generic drug: glucose blood CHECK FASTING BLOOD SUGAR TWICE DAILY    OXYGEN Inhale 3 L into the lungs continuous.    pioglitazone 30 MG tablet Commonly known as: Actos Take 1 tablet (30 mg total) by mouth daily.    potassium chloride SA 20 MEQ tablet Commonly known as: KLOR-CON M Take 1 tablet by mouth once daily    pravastatin 80  MG tablet Commonly known as: PRAVACHOL TAKE 1 TABLET BY MOUTH AT BEDTIME    predniSONE 10 MG tablet Commonly known as: DELTASONE Take 40 mg daily for 1 day, 30 mg daily for 1 day, 20 mg daily for 1 days,10 mg daily for 1 day, then stop What changed:  medication strength how much to take how to take this when to take this additional instructions    pregabalin 100 MG capsule Commonly known as: LYRICA Take 1 capsule (100 mg total) by mouth 2 (two) times daily. What changed:  when to take this reasons to take this    Spiriva HandiHaler 18 MCG inhalation capsule Generic drug: tiotropium Place 1 capsule (18 mcg total) into inhaler and inhale daily.       02/07/2022 ED visit for COPD exacerbation -Prednisone 20 mg daily with breakfast  Medications: Outpatient Encounter Medications as of 07/05/2022  Medication Sig   glycopyrrolate (ROBINUL) 0.2 MG/ML injection Inject 1 mL (0.2 mg total) into the skin every 4 (four) hours as needed (excessive secretions).   glycopyrrolate (ROBINUL) 1 MG tablet Take 1 tablet (1 mg total) by mouth every 4 (four) hours as needed (excessive secretions).   metoprolol succinate (TOPROL-XL) 50 MG 24 hr tablet Take 1 tablet (50 mg total) by mouth daily. Take with or immediately following a meal.   ondansetron (ZOFRAN) 4 MG tablet Take 1 tablet (4 mg total) by mouth every 6 (six) hours as needed for nausea.   No facility-administered encounter medications on file as of 07/05/2022.   Current COPD regimen:      No data to display         Any recent hospitalizations or ED visits since last visit with CPP? Yes Reportsdenies COPD symptoms, including  What recent interventions/DTPs have been made by any provider to improve breathing since last visit: Have you had exacerbation/flare-up since last visit?  What do you do when you are short of breath?    Respiratory Devices/Equipment Do you have a nebulizer?  Do you use a Peak Flow Meter?  Do you use a  maintenance inhaler?  How often do you forget to use your daily inhaler?  Do you use a rescue inhaler?  How often do you use your rescue inhaler?   Do you use a spacer with your inhaler?   Adherence Review: Does the patient have >5 day gap between last estimated fill date for maintenance inhaler medications?   **Unable to contact patient to complete this call. Number on file has been changed or disconnected.**  Care Gaps: Medicare Annual Wellness: Completed 12/16/2021 Ophthalmology Exam: Overdue since 05/14/2013 Foot Exam: Overdue since 10/12/2020 Hemoglobin A1C: 13.6% on 03/10/2022 Colonoscopy: Completed 04/23/2018  Future Appointments  Date Time Provider [place]  12/22/2022  8:15  AM [facility]NURSE HEALTH ADVISOR [facility]   Star Rating Drugs: Lantus last filled 04/08/2022 50 DS Pioglitazone 30 mg last filled 04/09/2022 90 DS Pravastatin 80 mg last filled 04/24/2022 30 DS  April D Calhoun, [place] Pharmacist Assistant [phone]

## 2022-08-28 ENCOUNTER — Other Ambulatory Visit: Payer: Self-pay | Admitting: Family Medicine
